# Patient Record
Sex: Female | Born: 1965 | Race: Black or African American | Hispanic: No | Marital: Married | Smoking: Never smoker
Health system: Southern US, Community
[De-identification: ages and names within clinical notes are randomized; demographics above are authoritative.]

## PROBLEM LIST (undated history)

## (undated) DIAGNOSIS — I2510 Atherosclerotic heart disease of native coronary artery without angina pectoris: Secondary | ICD-10-CM

## (undated) DIAGNOSIS — N186 End stage renal disease: Secondary | ICD-10-CM

## (undated) DIAGNOSIS — E669 Obesity, unspecified: Secondary | ICD-10-CM

## (undated) DIAGNOSIS — Z992 Dependence on renal dialysis: Secondary | ICD-10-CM

## (undated) DIAGNOSIS — I10 Essential (primary) hypertension: Secondary | ICD-10-CM

## (undated) DIAGNOSIS — I499 Cardiac arrhythmia, unspecified: Secondary | ICD-10-CM

## (undated) DIAGNOSIS — E785 Hyperlipidemia, unspecified: Secondary | ICD-10-CM

## (undated) HISTORY — DX: Essential (primary) hypertension: I10

## (undated) HISTORY — DX: Cardiac arrhythmia, unspecified: I49.9

## (undated) HISTORY — PX: TRACHEOSTOMY: SUR1362

## (undated) HISTORY — PX: GASTRIC BYPASS: SHX52

## (undated) HISTORY — DX: Hyperlipidemia, unspecified: E78.5

## (undated) HISTORY — DX: Atherosclerotic heart disease of native coronary artery without angina pectoris: I25.10

## (undated) HISTORY — DX: Obesity, unspecified: E66.9

---

## 1988-11-19 HISTORY — PX: CHOLECYSTECTOMY: SHX55

## 1998-05-10 ENCOUNTER — Inpatient Hospital Stay (HOSPITAL_COMMUNITY): Admission: AD | Admit: 1998-05-10 | Discharge: 1998-05-10 | Payer: Self-pay | Admitting: Obstetrics and Gynecology

## 1998-06-22 ENCOUNTER — Inpatient Hospital Stay (HOSPITAL_COMMUNITY): Admission: AD | Admit: 1998-06-22 | Discharge: 1998-06-26 | Payer: Self-pay | Admitting: Obstetrics and Gynecology

## 2000-07-06 ENCOUNTER — Ambulatory Visit (HOSPITAL_COMMUNITY): Admission: RE | Admit: 2000-07-06 | Discharge: 2000-07-06 | Payer: Self-pay | Admitting: Obstetrics & Gynecology

## 2000-07-06 ENCOUNTER — Encounter (INDEPENDENT_AMBULATORY_CARE_PROVIDER_SITE_OTHER): Payer: Self-pay

## 2001-02-07 ENCOUNTER — Ambulatory Visit (HOSPITAL_COMMUNITY): Admission: RE | Admit: 2001-02-07 | Discharge: 2001-02-07 | Payer: Self-pay | Admitting: Family Medicine

## 2001-02-07 ENCOUNTER — Encounter: Payer: Self-pay | Admitting: Family Medicine

## 2001-08-15 ENCOUNTER — Encounter: Admission: RE | Admit: 2001-08-15 | Discharge: 2001-11-13 | Payer: Self-pay | Admitting: Family Medicine

## 2004-04-12 ENCOUNTER — Encounter: Admission: RE | Admit: 2004-04-12 | Discharge: 2004-07-11 | Payer: Self-pay | Admitting: Endocrinology

## 2006-08-23 ENCOUNTER — Encounter: Admission: RE | Admit: 2006-08-23 | Discharge: 2006-08-23 | Payer: Self-pay | Admitting: Obstetrics and Gynecology

## 2009-02-01 ENCOUNTER — Encounter: Admission: RE | Admit: 2009-02-01 | Discharge: 2009-02-01 | Payer: Self-pay | Admitting: Family Medicine

## 2010-12-09 ENCOUNTER — Encounter: Payer: Self-pay | Admitting: Obstetrics and Gynecology

## 2010-12-20 HISTORY — PX: OTHER SURGICAL HISTORY: SHX169

## 2011-04-06 NOTE — Op Note (Signed)
[facility]  Patient:    Courtney [name], Courtney [name]               MRN: [id number] Proc. Date: 07/06/00 Adm. Date:  [id number] Attending:  Minette Headland Dictator:   Freddy Finner, M.D.                           Operative Report  PREOPERATIVE DIAGNOSES:       1. Intrauterine pregnancy at approximately                                  [redacted] weeks gestation with intrauterine fetal                                  demise.                               2. Insulin-dependent diabetes.                               3. Advanced maternal age.  POSTOPERATIVE DIAGNOSES:      1. Intrauterine pregnancy at approximately                                  [redacted] weeks gestation with intrauterine fetal                                  demise.                               2. Insulin-dependent diabetes.                               3. Advanced maternal age.  OPERATION:                    D&E.  SURGEON:                      Freddy Finner, M.D.  ANESTHESIA:                   Spinal.  ESTIMATED BLOOD LOSS:         Less than or equal to 200 cc.  INTRAOPERATIVE COMPLICATIONS:                None.  INDICATIONS:                  The patient is a 45 year old married female who had uncontrolled diabetes early in her pregnancy and was found to have a nonviable fetus on a level 3 ultrasound in [place] on approximately July 03, 2000.  She is admitted now for D&E.  She was seen in the office on the day prior to the procedure at which time two thick laminaria were placed into the cervix and a sponge to hold them in the cervix.  She was admitted on the morning of surgery.  DESCRIPTION OF PROCEDURE:  She was brought to the operating room and there placed under adequate spinal anesthesia and placed in the dorsal lithotomy position.  A Betadine prep was carried out in the usual fashion.  A bivalved speculum was introduced.  The cervix was grasped on its anterior lip  with ring forceps and the laminaria removed.  The cervix was progressively dilated to allow passage of a 15-mm suction cannula; this was introduced into the uterine cavity and aspiration produced obvious products of conception.  This was continued until the cervical cavity was evacuated.  A curettage with a banjo curet and exploration with placenta forceps was carried out.  Inspection of the products of conception confirmed the presence of all fetal parts including the skull.  The procedure at this point was terminated, the instruments removed.  The patient was taken to the recovery room in good condition.  She will receive RhoGAM because of her Rh negative status.  She is to call for fever, severe pain, heavy bleeding.  She is to avoid vaginal entry until her visit in the office in 1-1/2 to 2 weeks.  She is given Darvocet to be taken as needed for postoperative pain. DD:  07/07/00 TD:  07/08/00 Job: [id number]

## 2011-08-20 HISTORY — PX: COLONOSCOPY: SHX174

## 2011-09-21 ENCOUNTER — Other Ambulatory Visit: Payer: Self-pay | Admitting: Obstetrics and Gynecology

## 2011-09-21 ENCOUNTER — Ambulatory Visit: Admit: 2011-09-21 | Payer: Self-pay | Admitting: Obstetrics and Gynecology

## 2011-09-21 ENCOUNTER — Ambulatory Visit (HOSPITAL_BASED_OUTPATIENT_CLINIC_OR_DEPARTMENT_OTHER)
Admission: RE | Admit: 2011-09-21 | Discharge: 2011-09-21 | Disposition: A | Discharge status: Home / Self Care | Payer: 59 | Source: Ambulatory Visit | Attending: Obstetrics and Gynecology | Admitting: Obstetrics and Gynecology

## 2011-09-21 DIAGNOSIS — Z794 Long term (current) use of insulin: Secondary | ICD-10-CM | POA: Insufficient documentation

## 2011-09-21 DIAGNOSIS — N840 Polyp of corpus uteri: Secondary | ICD-10-CM | POA: Insufficient documentation

## 2011-09-21 DIAGNOSIS — N920 Excessive and frequent menstruation with regular cycle: Secondary | ICD-10-CM | POA: Insufficient documentation

## 2011-09-21 DIAGNOSIS — E119 Type 2 diabetes mellitus without complications: Secondary | ICD-10-CM | POA: Insufficient documentation

## 2011-09-21 DIAGNOSIS — I10 Essential (primary) hypertension: Secondary | ICD-10-CM | POA: Insufficient documentation

## 2011-09-21 DIAGNOSIS — Z79899 Other long term (current) drug therapy: Secondary | ICD-10-CM | POA: Insufficient documentation

## 2011-09-21 DIAGNOSIS — N879 Dysplasia of cervix uteri, unspecified: Secondary | ICD-10-CM | POA: Insufficient documentation

## 2011-09-21 DIAGNOSIS — D500 Iron deficiency anemia secondary to blood loss (chronic): Secondary | ICD-10-CM | POA: Insufficient documentation

## 2011-09-21 DIAGNOSIS — Z01812 Encounter for preprocedural laboratory examination: Secondary | ICD-10-CM | POA: Insufficient documentation

## 2011-09-21 DIAGNOSIS — E7800 Pure hypercholesterolemia, unspecified: Secondary | ICD-10-CM | POA: Insufficient documentation

## 2011-09-21 LAB — BASIC METABOLIC PANEL
BUN: 12 mg/dL (ref 6–23)
CO2: 23 mEq/L (ref 19–32)
Calcium: 9.2 mg/dL (ref 8.4–10.5)
Chloride: 102 mEq/L (ref 96–112)
Creatinine, Ser: 0.7 mg/dL (ref 0.50–1.10)
GFR calc Af Amer: >90 mL/min (ref >90)
GFR calc non Af Amer: >90 mL/min (ref >90)
Glucose, Bld: 201 mg/dL — ABNORMAL HIGH (ref 70–99)
Potassium: 3.9 mEq/L (ref 3.5–5.1)
Sodium: 134 mEq/L — ABNORMAL LOW (ref 135–145)

## 2011-09-21 LAB — CBC
HCT: 28.6 % — ABNORMAL LOW (ref 36.0–46.0)
Hemoglobin: 9 g/dL — ABNORMAL LOW (ref 12.0–15.0)
MCH: 28.2 pg (ref 26.0–34.0)
MCHC: 31.5 g/dL (ref 30.0–36.0)
MCV: 89.7 fL (ref 78.0–100.0)
Platelets: 296 10*3/uL (ref 150–400)
RBC: 3.19 MIL/uL — ABNORMAL LOW (ref 3.87–5.11)
RDW: 13.1 % (ref 11.5–15.5)
WBC: 6.2 10*3/uL (ref 4.0–10.5)

## 2011-09-21 LAB — HCG, SERUM, QUALITATIVE: Preg, Serum: NEGATIVE

## 2011-09-21 LAB — GLUCOSE, CAPILLARY: Glucose-Capillary: 189 mg/dL — ABNORMAL HIGH (ref 70–99)

## 2011-09-21 SURGERY — DILATATION & CURETTAGE/HYSTEROSCOPY WITH RESECTOCOPE
Anesthesia: Choice

## 2011-09-24 NOTE — Op Note (Signed)
NAMEBEREA, MAJKOWSKI       ACCOUNT NO.:  [id number]  MEDICAL RECORD NO.:  [id number]  LOCATION:                                 FACILITY:  PHYSICIAN:  Juluis Mire, M.D.   DATE OF BIRTH:  12-22-1965  DATE OF PROCEDURE:  09/21/2011 DATE OF DISCHARGE:                              OPERATIVE REPORT   PREOPERATIVE DIAGNOSES:  Endometrial polyp; cervical dysplasia, high- grade with unsatisfactory colposcopy.  POSTOPERATIVE DIAGNOSES:  Endometrial polyp; cervical dysplasia, high- grade with unsatisfactory colposcopy.  PROCEDURE:  Paracervical block using 1% Xylocaine with epinephrine. Cervical dilation, hysteroscopy with endometrial resection as well as curettings.  Subsequent loop electrical excision procedure of the cervix.  SURGEONS:  Juluis Mire, MD  ANESTHESIA:  General with paracervical block.  ESTIMATED BLOOD LOSS:  Minimal.  PACKS AND DRAINS:  None.  INTRAOPERATIVE BLOOD PLACED:  None.  COMPLICATIONS:  Were none.  INDICATIONS:  Dictated history and physical.  PROCEDURE IN DETAIL:  The patient was taken to the OR and placed in supine position.  After satisfactory level of general anesthesia obtained, the patient was placed in dorsal lithotomy position using the Allen stirrups.  Perineum and vagina prepped out with Betadine and draped in sterile field.  Speculum was placed in the vaginal vault.  The cervix was grasped with a single-tooth tenaculum.  Paracervical block of 1% Xylocaine with epinephrine was instituted.  Uterus sounded to 10 cm. Cervix serially dilated to a size 35 Pratt dilator.  Hysteroscope was introduced and intrauterine cavity was distended using glycine.  She had a thickened endometrium posteriorly, but no definitive polyp was noted for fibroid.  We went ahead and resected the thickening posteriorly and sent for pathological review.  Remaining uterine cavity is unremarkable. Curettings were obtained.  Deficit was minimal.  At this  point in time, the coated speculum was put in place.  Suction was applied.  We used a wide loop and shaved a cone from the cervix that was sent for pathological review.  Cone bed was cauterized using the Bovie and Monsel's was applied.  We had good hemostasis.  The patient was taken out of the dorsal lithotomy position. Once alert and extubated, transferred to recovery room in good condition.  Sponge, instrument, and needle count was correct by circulating nurse.     Juluis Mire, M.D.     JSM/MEDQ  D:  09/21/2011  T:  09/22/2011  Job:  [id number]

## 2011-10-04 NOTE — H&P (Signed)
Courtney [name], Courtney [name]      ACCOUNT NO.:  [id number]  MEDICAL RECORD NO.:  [id number]  LOCATION:                                 FACILITY:  PHYSICIAN:  Juluis Mire, M.D.   DATE OF BIRTH:  1966-07-10  DATE OF ADMISSION:  09/21/2011 DATE OF DISCHARGE:                             HISTORY & PHYSICAL   DATE OF SURGERY:  September 21, 2011.  HISTORY AND PHYSICAL:  45 year old, gravida 3, para 2, abortus 1 female presents for hysteroscopy, D and C, as well as loop electrical excision procedure of the cervix.  In relation to present admission, the patient is having trouble with menorrhagia with associated anemia.  Hemoglobin has been as low as 8.7.  Cycles are extremely heavy.  We did a saline infusion ultrasound in the office.  She has a large endometrial polyp that needs to be resected.  She also had abnormal cervical cytology suggestive of high-grade dysplasia.  Colposcopy was not satisfactory, therefore we are going to proceed with a loop electrical excision procedure at the time of the surgery.  In terms of medications, she is on metoprolol, Plavix, simvastatin, aspirin, metformin, Altace, gabapentin, Valtrex, Lantus, and fish oil.  PAST MEDICAL HISTORY:  Significant, she has a history of insulin- dependent diabetes.  She is being actively managed for that at the present time, also history of elevated cholesterol as noted.  Also does have hypertension.  PAST SURGICAL HISTORY:  She has had her gallbladder removed.  She has had 1 previous D and Cs, otherwise she has had 2 vaginal deliveries.  SOCIAL HISTORY:  Reveals no tobacco or alcohol use.  FAMILY HISTORY:  Significant.  There is a history of pancreatic and colon cancer, also a history of diabetes and heart disease.  REVIEW OF SYSTEMS:  Noncontributory.  PHYSICAL EXAMINATION:  VITAL SIGNS:  The patient is afebrile, stable vital signs. HEENT EXAM:  The patient is normocephalic.  Pupils equal, round, and reactive  to light and accommodation.  Extraocular movements are intact. Sclerae and conjunctivae are clear.  Oropharynx clear. NECK:  Without thyromegaly. BREASTS:  Not examined. LUNGS:  Clear. CARDIOVASCULAR SYSTEM:  Regular rhythm and rate without murmurs or gallops. ABDOMINAL EXAM:  Benign.  No mass, organomegaly, or tenderness. PELVIC:  Normal external genitalia.  Vaginal mucosa is clear.  Cervix unremarkable.  Uterus normal size, shape, and contour.  Adnexa free of mass or tenderness. EXTREMITIES:  Trace edema. NEUROLOGIC EXAM:  Grossly within normal limits.  IMPRESSION: 1. Menorrhagia with associated anemia and endometrial polyp. 2. Abnormal cervical cytology suggestive of high-grade dysplasia with     unsatisfactory colposcopy. 3. Insulin-dependent diabetes. 4. Hypertension. 5. Hypercholesterolemia.  PLAN:  The patient undergo hysteroscopy with resectoscope and D and C. The polyp will be resected.  Risks were explained including the risk of infection.  The risk of hemorrhage that could require transfusion withthe risk of AIDS or hepatitis.  Excessive bleeding could require hysterectomy.  There is a risk of perforation leading to injury to adjacent organs.  This can require further surgical management.  There is risk of deep venous thrombosis and pulmonary embolus.  With a loop of electrical excision procedure, discussed a 4% chance of continued bleeding require retreatment with the  risk of infection.  Risk of cervical stenosis that could lead to inability to conceive.  Risk of incompetent cervix could lead to inability to maintain pregnancy.  The patient understands the potential risks, complications, and alternatives.     Juluis Mire, M.D.     JSM/MEDQ  D:  09/19/2011  T:  09/19/2011  Job:  [id number]  Electronically Signed by Richardean Chimera M.D. on 10/04/2011 05:26:53 AM

## 2012-01-25 ENCOUNTER — Other Ambulatory Visit: Payer: Self-pay | Admitting: Cardiology

## 2012-01-25 ENCOUNTER — Inpatient Hospital Stay (HOSPITAL_COMMUNITY)
Admission: AD | Admit: 2012-01-25 | Discharge: 2012-01-31 | DRG: 287 | Disposition: A | Discharge status: Home / Self Care | Payer: 59 | Source: Ambulatory Visit | Attending: Interventional Cardiology | Admitting: Interventional Cardiology

## 2012-01-25 ENCOUNTER — Encounter: Payer: Self-pay | Admitting: Cardiology

## 2012-01-25 DIAGNOSIS — Z91041 Radiographic dye allergy status: Secondary | ICD-10-CM

## 2012-01-25 DIAGNOSIS — I2510 Atherosclerotic heart disease of native coronary artery without angina pectoris: Secondary | ICD-10-CM

## 2012-01-25 DIAGNOSIS — E1149 Type 2 diabetes mellitus with other diabetic neurological complication: Secondary | ICD-10-CM | POA: Diagnosis present

## 2012-01-25 DIAGNOSIS — N920 Excessive and frequent menstruation with regular cycle: Secondary | ICD-10-CM | POA: Diagnosis present

## 2012-01-25 DIAGNOSIS — G4733 Obstructive sleep apnea (adult) (pediatric): Secondary | ICD-10-CM | POA: Diagnosis present

## 2012-01-25 DIAGNOSIS — I472 Ventricular tachycardia, unspecified: Principal | ICD-10-CM

## 2012-01-25 DIAGNOSIS — N289 Disorder of kidney and ureter, unspecified: Secondary | ICD-10-CM

## 2012-01-25 DIAGNOSIS — I4729 Other ventricular tachycardia: Principal | ICD-10-CM | POA: Diagnosis present

## 2012-01-25 DIAGNOSIS — R5381 Other malaise: Secondary | ICD-10-CM | POA: Diagnosis present

## 2012-01-25 DIAGNOSIS — I200 Unstable angina: Secondary | ICD-10-CM | POA: Diagnosis present

## 2012-01-25 DIAGNOSIS — D509 Iron deficiency anemia, unspecified: Secondary | ICD-10-CM | POA: Diagnosis present

## 2012-01-25 DIAGNOSIS — E875 Hyperkalemia: Secondary | ICD-10-CM | POA: Diagnosis present

## 2012-01-25 DIAGNOSIS — Z6841 Body Mass Index (BMI) 40.0 and over, adult: Secondary | ICD-10-CM

## 2012-01-25 DIAGNOSIS — E11319 Type 2 diabetes mellitus with unspecified diabetic retinopathy without macular edema: Secondary | ICD-10-CM | POA: Diagnosis present

## 2012-01-25 DIAGNOSIS — I129 Hypertensive chronic kidney disease with stage 1 through stage 4 chronic kidney disease, or unspecified chronic kidney disease: Secondary | ICD-10-CM | POA: Diagnosis present

## 2012-01-25 DIAGNOSIS — N189 Chronic kidney disease, unspecified: Secondary | ICD-10-CM | POA: Diagnosis present

## 2012-01-25 DIAGNOSIS — E1139 Type 2 diabetes mellitus with other diabetic ophthalmic complication: Secondary | ICD-10-CM | POA: Diagnosis present

## 2012-01-25 DIAGNOSIS — I5032 Chronic diastolic (congestive) heart failure: Secondary | ICD-10-CM

## 2012-01-25 DIAGNOSIS — E1142 Type 2 diabetes mellitus with diabetic polyneuropathy: Secondary | ICD-10-CM | POA: Diagnosis present

## 2012-01-25 LAB — COMPREHENSIVE METABOLIC PANEL
ALT: 9 U/L (ref 0–35)
BUN: 36 mg/dL — ABNORMAL HIGH (ref 6–23)
CO2: 24 mEq/L (ref 19–32)
Calcium: 9.7 mg/dL (ref 8.4–10.5)
Creatinine, Ser: 1.77 mg/dL — ABNORMAL HIGH (ref 0.50–1.10)
GFR calc Af Amer: 39 mL/min — ABNORMAL LOW (ref >90)
GFR calc non Af Amer: 33 mL/min — ABNORMAL LOW (ref >90)
Glucose, Bld: 128 mg/dL — ABNORMAL HIGH (ref 70–99)
Sodium: 137 mEq/L (ref 135–145)

## 2012-01-25 LAB — PROTIME-INR
INR: 1.17 (ref 0.00–1.49)
INR: 1.15 (ref 0.00–1.49)

## 2012-01-25 LAB — CBC
HCT: 29.6 % — ABNORMAL LOW (ref 36.0–46.0)
HCT: 29.3 % — ABNORMAL LOW (ref 36.0–46.0)
Hemoglobin: 9.8 g/dL — ABNORMAL LOW (ref 12.0–15.0)
Hemoglobin: 9.6 g/dL — ABNORMAL LOW (ref 12.0–15.0)
MCH: 30.2 pg (ref 26.0–34.0)
MCHC: 33.1 g/dL (ref 30.0–36.0)
MCHC: 32.8 g/dL (ref 30.0–36.0)
MCV: 91.4 fL (ref 78.0–100.0)
MCV: 91.3 fL (ref 78.0–100.0)
RBC: 3.24 MIL/uL — ABNORMAL LOW (ref 3.87–5.11)

## 2012-01-25 LAB — BASIC METABOLIC PANEL
BUN: 36 mg/dL — ABNORMAL HIGH (ref 6–23)
CO2: 22 mEq/L (ref 19–32)
Calcium: 9.5 mg/dL (ref 8.4–10.5)
Creatinine, Ser: 1.59 mg/dL — ABNORMAL HIGH (ref 0.50–1.10)
Glucose, Bld: 127 mg/dL — ABNORMAL HIGH (ref 70–99)

## 2012-01-25 LAB — GLUCOSE, CAPILLARY
Glucose-Capillary: 139 mg/dL — ABNORMAL HIGH (ref 70–99)
Glucose-Capillary: 178 mg/dL — ABNORMAL HIGH (ref 70–99)

## 2012-01-25 LAB — DIFFERENTIAL
Basophils Absolute: 0.1 10*3/uL (ref 0.0–0.1)
Eosinophils Relative: 1 % (ref 0–5)
Lymphocytes Relative: 37 % (ref 12–46)
Neutrophils Relative %: 54 % (ref 43–77)

## 2012-01-25 LAB — HEMOGLOBIN A1C
Hgb A1c MFr Bld: 9.5 % — ABNORMAL HIGH (ref <5.7)
Mean Plasma Glucose: 226 mg/dL — ABNORMAL HIGH (ref <117)

## 2012-01-25 LAB — CARDIAC PANEL(CRET KIN+CKTOT+MB+TROPI)
CK, MB: 2.1 ng/mL (ref 0.3–4.0)
Relative Index: INVALID (ref 0.0–2.5)
Relative Index: 1.7 (ref 0.0–2.5)
Total CK: 122 U/L (ref 7–177)
Troponin I: <0.3 ng/mL (ref <0.30)
Troponin I: <0.3 ng/mL (ref <0.30)

## 2012-01-25 MED ORDER — ASPIRIN 325 MG PO TABS
325.0000 mg | ORAL_TABLET | Freq: Every day | ORAL | Status: DC
  Administered 2012-01-26 – 2012-01-27 (×2): 325 mg via ORAL
  Filled 2012-01-25 (×6): qty 1

## 2012-01-25 MED ORDER — ONDANSETRON HCL 4 MG/2ML IJ SOLN: 4.0000 mg | Freq: Four times a day (QID) | INTRAMUSCULAR | Status: DC | PRN

## 2012-01-25 MED ORDER — ASPIRIN EC 81 MG PO TBEC: 81.0000 mg | DELAYED_RELEASE_TABLET | Freq: Every day | ORAL | Status: DC

## 2012-01-25 MED ORDER — DICLOFENAC SODIUM 75 MG PO TBEC
75.0000 mg | DELAYED_RELEASE_TABLET | Freq: Two times a day (BID) | ORAL | Status: DC
  Administered 2012-01-25 – 2012-01-27 (×4): 75 mg via ORAL
  Filled 2012-01-25 (×5): qty 1

## 2012-01-25 MED ORDER — FERROUS SULFATE 325 (65 FE) MG PO TABS
325.0000 mg | ORAL_TABLET | Freq: Two times a day (BID) | ORAL | Status: DC
  Administered 2012-01-25 – 2012-01-31 (×13): 325 mg via ORAL
  Filled 2012-01-25 (×15): qty 1

## 2012-01-25 MED ORDER — ACETAMINOPHEN 325 MG PO TABS
650.0000 mg | ORAL_TABLET | ORAL | Status: DC | PRN
  Administered 2012-01-29: 650 mg via ORAL
  Filled 2012-01-25: qty 2

## 2012-01-25 MED ORDER — SODIUM CHLORIDE 0.9 % IV SOLN: 250.0000 mL | INTRAVENOUS | Status: DC | PRN

## 2012-01-25 MED ORDER — SODIUM CHLORIDE 0.9 % IV SOLN: INTRAVENOUS | Status: DC

## 2012-01-25 MED ORDER — NITROGLYCERIN 0.4 MG SL SUBL: 0.4000 mg | SUBLINGUAL_TABLET | SUBLINGUAL | Status: DC | PRN

## 2012-01-25 MED ORDER — ASPIRIN 81 MG PO CHEW
324.0000 mg | CHEWABLE_TABLET | ORAL | Status: AC
  Administered 2012-01-28: 324 mg via ORAL
  Filled 2012-01-25: qty 4

## 2012-01-25 MED ORDER — POLYETHYLENE GLYCOL 3350 17 G PO PACK
17.0000 g | PACK | Freq: Every day | ORAL | Status: DC
  Administered 2012-01-25 – 2012-01-31 (×6): 17 g via ORAL
  Filled 2012-01-25 (×7): qty 1

## 2012-01-25 MED ORDER — DIAZEPAM 5 MG PO TABS
5.0000 mg | ORAL_TABLET | ORAL | Status: AC
  Administered 2012-01-28: 5 mg via ORAL
  Filled 2012-01-25: qty 1

## 2012-01-25 MED ORDER — ASPIRIN 81 MG PO CHEW
324.0000 mg | CHEWABLE_TABLET | ORAL | Status: AC
  Administered 2012-01-25: 324 mg via ORAL
  Filled 2012-01-25: qty 4

## 2012-01-25 MED ORDER — INSULIN GLARGINE 100 UNIT/ML ~~LOC~~ SOLN
50.0000 [IU] | Freq: Every day | SUBCUTANEOUS | Status: DC
  Administered 2012-01-25 – 2012-01-30 (×6): 50 [IU] via SUBCUTANEOUS
  Filled 2012-01-25: qty 3

## 2012-01-25 MED ORDER — ASPIRIN 300 MG RE SUPP
300.0000 mg | RECTAL | Status: AC
  Filled 2012-01-25: qty 1

## 2012-01-25 MED ORDER — OMEGA-3 FATTY ACIDS 1000 MG PO CAPS: 2.0000 g | ORAL_CAPSULE | Freq: Every day | ORAL | Status: DC

## 2012-01-25 MED ORDER — AMLODIPINE BESYLATE 5 MG PO TABS
5.0000 mg | ORAL_TABLET | Freq: Every day | ORAL | Status: DC
  Administered 2012-01-25 – 2012-01-27 (×3): 5 mg via ORAL
  Filled 2012-01-25 (×5): qty 1

## 2012-01-25 MED ORDER — SIMVASTATIN 40 MG PO TABS
40.0000 mg | ORAL_TABLET | Freq: Every evening | ORAL | Status: DC
  Administered 2012-01-25: 40 mg via ORAL
  Filled 2012-01-25 (×2): qty 1

## 2012-01-25 MED ORDER — SODIUM CHLORIDE 0.9 % IJ SOLN: 3.0000 mL | INTRAMUSCULAR | Status: DC | PRN

## 2012-01-25 MED ORDER — SODIUM CHLORIDE 0.9 % IJ SOLN
3.0000 mL | INTRAMUSCULAR | Status: DC | PRN
  Administered 2012-01-26 (×2): 3 mL via INTRAVENOUS

## 2012-01-25 MED ORDER — GABAPENTIN 300 MG PO CAPS
300.0000 mg | ORAL_CAPSULE | Freq: Two times a day (BID) | ORAL | Status: DC
  Administered 2012-01-25 – 2012-01-31 (×11): 300 mg via ORAL
  Filled 2012-01-25 (×13): qty 1

## 2012-01-25 MED ORDER — IRBESARTAN 150 MG PO TABS
150.0000 mg | ORAL_TABLET | Freq: Every day | ORAL | Status: DC
  Administered 2012-01-25 – 2012-01-26 (×2): 150 mg via ORAL
  Filled 2012-01-25 (×2): qty 1

## 2012-01-25 MED ORDER — FAMOTIDINE IN NACL 20-0.9 MG/50ML-% IV SOLN
20.0000 mg | INTRAVENOUS | Status: AC
  Administered 2012-01-28: 20 mg via INTRAVENOUS
  Filled 2012-01-25: qty 50

## 2012-01-25 MED ORDER — PREDNISONE 50 MG PO TABS
60.0000 mg | ORAL_TABLET | ORAL | Status: AC
  Administered 2012-01-28: 60 mg via ORAL
  Filled 2012-01-25: qty 1

## 2012-01-25 MED ORDER — AMLODIPINE-OLMESARTAN 5-20 MG PO TABS: 1.0000 | ORAL_TABLET | Freq: Every day | ORAL | Status: DC

## 2012-01-25 MED ORDER — METOPROLOL TARTRATE 50 MG PO TABS
50.0000 mg | ORAL_TABLET | Freq: Two times a day (BID) | ORAL | Status: DC
  Administered 2012-01-25 – 2012-01-28 (×6): 50 mg via ORAL
  Filled 2012-01-25 (×9): qty 1

## 2012-01-25 MED ORDER — PREDNISONE 50 MG PO TABS
60.0000 mg | ORAL_TABLET | ORAL | Status: AC
  Administered 2012-01-27: 60 mg via ORAL
  Filled 2012-01-25: qty 1

## 2012-01-25 MED ORDER — HEPARIN SODIUM (PORCINE) 5000 UNIT/ML IJ SOLN
5000.0000 [IU] | Freq: Three times a day (TID) | INTRAMUSCULAR | Status: DC
  Administered 2012-01-25 – 2012-01-28 (×8): 5000 [IU] via SUBCUTANEOUS
  Filled 2012-01-25 (×11): qty 1

## 2012-01-25 MED ORDER — CLOPIDOGREL BISULFATE 75 MG PO TABS
75.0000 mg | ORAL_TABLET | Freq: Every day | ORAL | Status: DC
  Administered 2012-01-25 – 2012-01-27 (×3): 75 mg via ORAL
  Filled 2012-01-25 (×4): qty 1

## 2012-01-25 MED ORDER — OMEGA-3-ACID ETHYL ESTERS 1 G PO CAPS
1.0000 g | ORAL_CAPSULE | Freq: Every day | ORAL | Status: DC
  Administered 2012-01-25 – 2012-01-31 (×6): 1 g via ORAL
  Filled 2012-01-25 (×7): qty 1

## 2012-01-25 MED ORDER — DIPHENHYDRAMINE HCL 50 MG/ML IJ SOLN
25.0000 mg | INTRAMUSCULAR | Status: AC
  Administered 2012-01-28: 25 mg via INTRAVENOUS
  Filled 2012-01-25: qty 1
  Filled 2012-01-25: qty 0.5

## 2012-01-25 MED ORDER — SODIUM CHLORIDE 0.9 % IJ SOLN: 3.0000 mL | Freq: Two times a day (BID) | INTRAMUSCULAR | Status: DC

## 2012-01-25 MED ORDER — SODIUM CHLORIDE 0.9 % IJ SOLN
3.0000 mL | Freq: Two times a day (BID) | INTRAMUSCULAR | Status: DC
  Administered 2012-01-25: 3 mL via INTRAVENOUS

## 2012-01-25 NOTE — Plan of Care (Signed)
Problem: Phase I Progression Outcomes Goal: Voiding-avoid urinary catheter unless indicated Outcome: Completed/Met Date Met:  01/25/12 voiding

## 2012-01-25 NOTE — H&P (Addendum)
Office Visit     Patient: Courtney [name], [place] Provider: Michaell Cowing. Emelda Fear, NP  DOB: 1965/11/28 Age: 46 Y Sex: Female Date: 01/18/2012  Phone: [phone]   Address: [address], [place], [phone]       Subjective:     CC:    1. CF/heart racing.        HPI:  General:  Courtney [name] is a 46 yo female with a hx of Diabetes, HTN, obesity,CAD with BMS 2010, and hx nonsustained V tac prior to LAD stent 2010 and metoprolol. Holter monitor 6/12 with occasional PVC no runs and in NSR. 10/12 nuclear stres test without ischemia. She comes in today after feeling her heart rate was up to 135 at 430 this am at home, awakened from sleep and she was lightheaded. She will suddenly feel dizziness like she might pass out if she does not sit down. This occurred twice the last few nights where she felt very dizzy. This occurred a few years ago when she was anemic and had transfusion. She is followed by GYN, Dr Arelia Sneddon due to vaginal bleeding but this has improved. She reports last Hgb check was low. She states she has not been taking her Fe tablets twice a day. She may have shortness of breath even at rest. A sleep study has been recommended but she has not obtained..  Home BP 160/100.       ROS:  as noted in HPI, she has multiple complaints, such as nausea at times unrelated to meals, and generally feels fatigued. no syncope, no fever, chills nor congestion.       Medical History: Type 2 Diabetes Mellitus, retinopathy, neuropathy, and microalbuminemia., Obesity, Hypercholesterolemia, Coronary artery disease, status post BM Stent, February 2010, Hypertension, Tachycardia, Symptomatic nonsustained V. tach, resolved with LAD stent and beta blocker. Norma lLVEF by cath, echo, and nuclear 12/2008.        Surgical History: Cholecystectomy 1990, Cardiac Stent February 2010, Colonoscopy 09/19/2011.        Hospitalization/Major Diagnostic Procedure: Pneumonia 2010, cholecystitis 1990, not in  the past year 08/2011.        Family History: Father: alive Diabetes Mellitus,Hypertension, Bypass Surgery Mother: deceased Pancreatic cancer colon cancer Paternal Grand Father: deceased Diabetes Mellitus, Hypertension, Alzheimer's Paternal Grand Mother: deceased Diabetes Mellitus, Hypertension Maternal Grand Father: deceased Unknown Maternal Grand Mother: alive Colon Cancer Sister 1: alive colon cancer Sister 2: alive Well Sister 3: alive Well  Sisters 4, 5, 6, and 7 - Alive and Well.       Social History:  General: History of smoking cigarettes: Never smoked. no Smoking. Alcohol: yes, Social. no Caffeine. no Recreational drug use. Exercise: yes. Occupation: unemployed. Marital Status: married. Children: 2.        Medications: Ferrous Sulfate 325 (65 Fe) MG Tablet 1 tablet Twice a day, Simvastatin 40 MG Tablet 1 tablet every evening Once a day, Fish Oil 1000 MG Capsule 1 capsule with a meal Once a day, Miralax suspension 17 grams at hour of sleep, Metformin HCl 1000 MG Tablet 1 tablet Twice a day, Gabapentin 300 MG Capsule 1 am/1 pm Daily, Voltaren 75 MG tablet Tablet 1 tablet BID, Lantus 100 UNIT/ML Solution 40 units qhs, Cinnamon 500 MG Tablet 1 tablet occ, Aspirin 325 MG Tablet Delayed Release 1 tablet every day, Plavix 75 MG Tablet 1 tablet once a day, Azor 5-20 MG Tablet 1 tablet Once a day, Metoprolol Tartrate 50 MG Tablet 1 tablet Twice a day, Medication List reviewed and  reconciled with the patient       Allergies: Ampicillin: rash; can tolerate amoxicillin, contrast dye: hives.       Objective:     Vitals: Wt 300.8, Wt change -2.9 lb, Ht 69.5, BMI 43.78, Pulse sitting 76, BP sitting 100/60.       Examination:  Cardiology Exam:  GENERAL APPEARANCE: pleasant, NAD, obese, female. HEENT: normal. CAROTID UPSTROKE: no bruit, upstrokes intact. JVD: flat. HEART: regular rate and rhythm, normal S1S2, no rub, no gallop, or click occasional individual skipped beats, reassessed 30 minutes  after EKG, skipping decreased.. LUNGS: clear to auscultation, no wheezing/rhonchi/rales. ABDOMEN: soft, non-tender, + bowel sounds. EXTREMITIES: no leg edema. PERIPHERAL PULSES: 2+, bilateral.        Assessment:     Assessment:  1. Lightheadedness - 780.4 (Primary)  2. Coronary artery disease - 414.00, reassuring nuclear stress test 10/12  3. PVC - 427.69  4. Near syncope - 780.2    Plan:     1. Lightheadedness  LAB: Basic Metabolic    GLUCOSE 411 70-99 - mg/dL HH   BUN 23 1-61 - mg/dL    CREATININE 0.96 0.45-4.09 - mg/dl    eGFR (NON-AFRICAN AMERICAN) 48 >60 - calc L   eGFR (AFRICAN AMERICAN) 58 >60 - calc L   SODIUM 132 136-145 - mmol/L L   POTASSIUM 5.1 3.5-5.5 - mmol/L    CHLORIDE 100 98-107 - mmol/L    C02 25 22-32 - mg/dL    ANION GAP 81.1 9.1-47.8 - mmol/L    CALCIUM 9.7 8.6-10.3 - mg/dL     FERGUSON,CYNTHIA A 01/18/2012 03:44:45 PM > hyperglycemia, please ask pt has she been taking her insulin and metformin...please also send this to her PCP...this may explain alot of her symptoms we are awaiting cardiac monitor results [facility] 01/18/2012 04:55:32 PM > lmom informing pt and asked that she call back regarding ? of DM meds----to Dr Wynelle Link [facility] 01/18/2012 05:03:58 PM > Noted. She is supposed to be working with Dr. Sharl Ma, endocrinologist, for her diabetes. Will await her phone call to this office, but would forward info to Dr Sharl Ma as well. FERGUSON,CYNTHIA A 01/20/2012 01:13:26 PM > to Dr Sharl Ma [facility] 01/20/2012 05:39:21 PM > It appears that her most recent endocrinology visit occurred in May 2012. Courtney [name], please encourge Courtney [name] to schedule an office visit. Thank you. Courtney [name],Courtney [name] 01/22/2012 09:49:21 AM > Pt transferred to [facility] in [place] in December.    LAB: CBC with Diff    WBC 6.6 4.0-11.0 - K/ul    RBC 3.80 4.20-5.40 - M/uL L   HGB 11.7 12.0-16.0 - g/dL L   HCT 29.5 62.1-30.8 - % L   MCH 30.6 27.0-33.0 - pg    MPV 8.4  7.5-10.7 - fL    MCV 92.8 81.0-99.0 - fL    MCHC 33.0 32.0-36.0 - g/dL    RDW 65.7 84.6-96.2 - %    PLT 258 150-400 - K/uL    NEUT % 50.5 43.3-71.9 - %    LYMPH% 41.0 16.8-43.5 - %    MONO % 7.8 4.6-12.4 - %    EOS % 0.7 0.0-7.8 - %    BASO % 0.0 0.0-1.0 - %    NEUT # 3.4 1.9-7.2 - K/uL    LYMPH# 2.70 1.10-2.70 - K/uL    MONO # 0.5 0.3-0.8 - K/uL    EOS # 0.0 0.0-0.6 - K/uL    BASO # 0.0 0.0-0.1 - K/uL     FERGUSON,CYNTHIA  A 01/18/2012 03:19:29 PM > please tell pt her level of anemia is stable compared to her reported Hgb of 9.. cc to Dr Kingsley Plan 01/18/2012 04:53:14 PM > lmom informing pt Boehler,Eileen 01/21/2012 09:40:33 AM > copy of labs faxed to Dr Erlinda Hong Comb    Diagnostic Imaging:EKG NSR with Bigeminal PVCs vs possible PAC with aberrancies, Harward,Amy 01/18/2012 01:21:12 PM > FERGUSON,CYNTHIA A 01/18/2012 04:18:38 PM > also see scanned rhythmn strip..reviewed with Dr Anne Fu  I have asked pt to f/u with Dr Wynelle Link that some of her symptoms today such as nausea and sweatiness do not sound cardiac, I encouraged her to also f/u and have her sleep study that was offered to her from her weight loss examination and MD. she agrees to follow up with this.       2. Coronary artery disease Continue Simvastatin Tablet, 40 MG, 1 tablet every evening, Orally, Once a day ; Continue Fish Oil Capsule, 1000 MG, 1 capsule with a meal, Orally, Once a day ; Continue Aspirin Tablet Delayed Release, 325 MG, 1 tablet, Orally, every day ; Continue Plavix Tablet, 75 MG, 1 tablet, Orally, once a day ; Continue Azor Tablet, 5-20 MG, 1 tablet, Orally, Once a day .       3. PVC Continue Metoprolol Tartrate Tablet, 50 MG, 1 tablet, Orally, Twice a day .  LAB: TSH    TSH 1.04 0.34-5.60 - ulU/mL     FERGUSON,CYNTHIA A 01/18/2012 03:43:47 PM > stable Boehler,Eileen 01/18/2012 04:55:01 PM > lmom informing pt    with anemia stable we will obtain a cardiac monitor after EKG and rhythmn strip indicate bigeminal  PVCs to R/O dangerous arrhythmis and have pt also f/u with Dr Katrinka Blazing.       4. Near syncope  Diagnostic Imaging:EC Holter 48hr (Ordered for 01/18/2012)       5. Others Continue Metformin HCl Tablet, 1000 MG, 1 tablet, Orally, Twice a day ; Continue Lantus Solution, 100 UNIT/ML, 40 units, Subcutaneous, qhs ; Continue Ferrous Sulfate Tablet, 325 (65 Fe) MG, 1 tablet, Orally, Twice a day .        Immunizations:        Labs:        Procedure Codes: 16109 EKG I AND R, 80048 ECL BMP, 85025 ECL CBC PLATELET DIFF, 84443 ECL TSH, 60454 BLOOD COLLECTION ROUTINE VENIPUNCTURE       Preventive:         Follow Up: HS 4 weeks (Reason: PVCs, lightheadness, )      Provider: Michaell Cowing. Emelda Fear, NP  Patient: Courtney [name], [place] M DOB: [birth year] Date: 01/18/2012   The patient was found on Holter Monitor to have nonsustained ventricular tachycardia up to 46 beats. She was brought into the office today to review the findings of the Holter.  She had severe palpitations on Saturday 3/2 with presyncope which prompted an ER visit but she left.  This corresponded to the time on the Holter when she had the 46beat run of [facility].  Currently she is asymptomatic.   She is now admitted for further workup.  She says she has been having intermittent palpitations and dizziness for several months along with the chest pressure but never gets chest pressure without the palpitations.  VS on exam today showed BP 124/6mmHg and pulse 84bpm.  Exam benign.    IMPRESSION:  1.  Ventricular tachycardia up to 46 beats.  She has a history of VT in the presence of LAD stenosis in 2010 which resolved  after stent placement and beta blocker therapy.  Given reoccurence of VT and known history of CAD/DM, ischemic origin needs to be ruled out.  She has been having some chest pressure in association with the palpitations. 2.  CAD s/p BMS to LAD 2010 3.  DM with retinopathy, neuropathy and microalbuminemia 4.  HTN 5.   dyslipidmiea 6.  Obesity  PLAN:  1.  Admit to step down unit 2.  Cycle cardiac enzymes 3.  Check TSH 4.  Cardiac cath on Monday 3/11 by Dr. Katrinka Blazing 5.  Increase metoprolol to 75mg  BID

## 2012-01-25 NOTE — Plan of Care (Signed)
Problem: Phase I Progression Outcomes Goal: Pain controlled with appropriate interventions Outcome: Completed/Met Date Met:  01/25/12 Patient has no c/o chest pain.

## 2012-01-26 LAB — BASIC METABOLIC PANEL
BUN: 40 mg/dL — ABNORMAL HIGH (ref 6–23)
CO2: 23 mEq/L (ref 19–32)
Chloride: 102 mEq/L (ref 96–112)
GFR calc Af Amer: 58 mL/min — ABNORMAL LOW (ref >90)
Potassium: 4.9 mEq/L (ref 3.5–5.1)

## 2012-01-26 LAB — CBC
HCT: 28.3 % — ABNORMAL LOW (ref 36.0–46.0)
Hemoglobin: 9.3 g/dL — ABNORMAL LOW (ref 12.0–15.0)
MCH: 30 pg (ref 26.0–34.0)
RBC: 3.1 MIL/uL — ABNORMAL LOW (ref 3.87–5.11)

## 2012-01-26 LAB — GLUCOSE, CAPILLARY: Glucose-Capillary: 194 mg/dL — ABNORMAL HIGH (ref 70–99)

## 2012-01-26 LAB — PRO B NATRIURETIC PEPTIDE: Pro B Natriuretic peptide (BNP): 101.6 pg/mL (ref 0–125)

## 2012-01-26 MED ORDER — ATORVASTATIN CALCIUM 20 MG PO TABS
20.0000 mg | ORAL_TABLET | Freq: Every day | ORAL | Status: DC
Start: 2012-01-26 — End: 2012-01-31
  Administered 2012-01-26 – 2012-01-31 (×6): 20 mg via ORAL
  Filled 2012-01-26 (×6): qty 1

## 2012-01-26 MED ORDER — SODIUM CHLORIDE 0.9 % IV SOLN
INTRAVENOUS | Status: DC
  Administered 2012-01-27: 23:00:00 via INTRAVENOUS

## 2012-01-26 MED ORDER — INSULIN ASPART 100 UNIT/ML ~~LOC~~ SOLN
0.0000 [IU] | Freq: Three times a day (TID) | SUBCUTANEOUS | Status: DC
  Administered 2012-01-26: 3 [IU] via SUBCUTANEOUS
  Administered 2012-01-26: 5 [IU] via SUBCUTANEOUS
  Administered 2012-01-27 (×2): 3 [IU] via SUBCUTANEOUS
  Administered 2012-01-28: 11 [IU] via SUBCUTANEOUS
  Administered 2012-01-28: 8 [IU] via SUBCUTANEOUS
  Administered 2012-01-29: 2 [IU] via SUBCUTANEOUS
  Administered 2012-01-29 – 2012-01-30 (×3): 3 [IU] via SUBCUTANEOUS
  Administered 2012-01-30: 8 [IU] via SUBCUTANEOUS
  Administered 2012-01-30: 15 [IU] via SUBCUTANEOUS
  Administered 2012-01-31: 11 [IU] via SUBCUTANEOUS
  Administered 2012-01-31: 8 [IU] via SUBCUTANEOUS
  Administered 2012-01-31: 3 [IU] via SUBCUTANEOUS
  Filled 2012-01-26: qty 3
  Filled 2012-01-26: qty 0.15

## 2012-01-26 NOTE — Progress Notes (Signed)
Patient transferring to 2000, patient alert, orient without any c/o pain or discomfort. Report given to RN discussed, reason for admission, history, planned cath on Monday and allergies. All questions answered.

## 2012-01-26 NOTE — Progress Notes (Signed)
MEDICATION RELATED CONSULT NOTE - INITIAL   MD: patient on amlodipine and simvastatin >20mg  PTA and in hospital. Patient's on such combinations have increased reports of rhabdomyolysis.   Per pharmacy protocol, will change simvastatin to atorvastatin to minimize the risk of rhabdo. Please address this upon discharge medication reconciliation.   Thank you,  Brett Fairy, PharmD Pager: [phone]  01/26/2012 9:13 AM

## 2012-01-26 NOTE — Progress Notes (Signed)
Patient Name: Mykaela Arena Date of Encounter: 01/26/2012    SUBJECTIVE: I am vaguely familiar with this patient. Over the past 6-8 months she has been seen more by our extender in the office then me. I do not recall her history which was playing out in [place]. She had near syncope while driving her car. She was found to be in V. tach at that time (2010). She had elevated cardiac markers, underwent cath, and had bare-metal stenting of the LAD. As I review the records it appears that very his symptoms began to recur this past fall (2012). A nuclear perfusion study was performed and was negative for ischemia. She has various complaints but more recently has had near syncope and palpitations. A Holter monitor was placed and recently and upon evaluation demonstrated greater than 10 seconds of sustained VT with associated symptoms. She denies chest pain and has not had any runs of tachycardia since admission.  There are other various complaints including left-sided weakness. She has anemia related to heavy menstrual bleeding. She says her diabetes has been under control but the A1c is 9.5.  TELEMETRY:  Nonsustained ventricular tachycardia. Filed Vitals:   01/26/12 0400 01/26/12 0500 01/26/12 0600 01/26/12 0800  BP: 107/47 123/60    Pulse: 61 62    Temp:   98.2 F (36.8 C) 99 F (37.2 C)  TempSrc:   Oral Oral  Resp: 21 19    Height:      Weight:   138 kg (304 lb 3.8 oz)   SpO2: 95% 97%      Intake/Output Summary (Last 24 hours) at 01/26/12 0857 Last data filed at 01/26/12 0600  Gross per 24 hour  Intake    120 ml  Output    400 ml  Net   -280 ml    LABS: Basic Metabolic Panel:  Basename 01/25/12 1943 01/25/12 1713  NA 136 137  K 4.6 5.1  CL 101 102  CO2 22 24  GLUCOSE 127* 128*  BUN 36* 36*  CREATININE 1.59* 1.77*  CALCIUM 9.5 9.7  MG -- --  PHOS -- --   CBC:  Basename 01/25/12 1943 01/25/12 1713  WBC 10.2 9.5  NEUTROABS -- 5.1  HGB 9.6* 9.8*  HCT 29.3* 29.6*    MCV 91.3 91.4  PLT 296 280   Cardiac Enzymes:  Basename 01/26/12 0510 01/25/12 2212 01/25/12 1712  CKTOTAL 109 122 91  CKMB 1.7 2.1 2.2  CKMBINDEX -- -- --  TROPONINI <0.30 <0.30 <0.30   Hemoglobin A1C:  Basename 01/25/12 1713  HGBA1C 9.5*    Radiology/Studies:  No studies ordered  Physical Exam: Blood pressure 123/60, pulse 62, temperature 99 F (37.2 C), temperature source Oral, resp. rate 19, height 5\' 9"  (1.753 m), weight 138 kg (304 lb 3.8 oz), SpO2 97.00%. Weight change:    2 of 6 systolic murmur. No gallop.  Markedly obese. Abdomen is soft.  Extremities no edema. Radial pulses are 2+ and symmetric. Carotids are 2+ without bruits.  Neuro exam demonstrates possible mild left-sided weakness  ASSESSMENT:  1. Sustained ventricular tachycardia, which in the past has been associated with myocardial ischemia. She has a bare-metal stent in the LAD.  2. Left-sided weakness, chronic for the past 6-8 months.  3. Significant anemia, hemoglobin 9.5, and in our office 2 weeks ago it was 11.3.  4. Poorly controlled diabetes  5. Significant hypertension  6. probable obstructive sleep apnea  7. Chronic kidney disease with possible acute component secondary to intravascular  volume depletion    Plan:  1. IV hydration  2. Will have a long conversation with the patient and family. She is at high risk for future vascular events.  3. Check stools for occult blood  4. Consider CT scan of the head although I worry about using contrast with current renal function  5. Coronary angiography scheduled for Monday morning at 7:30 AM  Signed, Veatrice Kells W 01/26/2012, 8:57 AM

## 2012-01-27 ENCOUNTER — Inpatient Hospital Stay (HOSPITAL_COMMUNITY): Payer: 59

## 2012-01-27 LAB — CBC
MCH: 29.9 pg (ref 26.0–34.0)
MCHC: 32.6 g/dL (ref 30.0–36.0)
MCV: 91.8 fL (ref 78.0–100.0)
Platelets: 309 10*3/uL (ref 150–400)
RDW: 14.4 % (ref 11.5–15.5)

## 2012-01-27 LAB — BASIC METABOLIC PANEL
CO2: 23 mEq/L (ref 19–32)
Calcium: 9.4 mg/dL (ref 8.4–10.5)
Creatinine, Ser: 1.4 mg/dL — ABNORMAL HIGH (ref 0.50–1.10)
Glucose, Bld: 152 mg/dL — ABNORMAL HIGH (ref 70–99)

## 2012-01-27 LAB — GLUCOSE, CAPILLARY

## 2012-01-27 NOTE — H&P (Signed)
Performed by Dr Mayford Knife on admission, should be in scanned documents

## 2012-01-27 NOTE — Progress Notes (Signed)
Patient Name: Courtney [name] Date of Encounter: 01/27/2012    SUBJECTIVE: The patient has had mild tightness in her chest intermittently. No palpitations. No monitor evidence of significant ventricular arrhythmia.  TELEMETRY:  Normal sinus rhythm with occasional PVC.: Filed Vitals:   01/26/12 1249 01/26/12 1630 01/26/12 2016 01/27/12 0449  BP: 127/65 133/78 119/70 106/67  Pulse: 68 57 67 71  Temp:  98.2 F (36.8 C) 98 F (36.7 C) 98.2 F (36.8 C)  TempSrc:  Oral Oral Oral  Resp: 20 20 19 18   Height:      Weight:      SpO2: 99% 97% 96% 96%    Intake/Output Summary (Last 24 hours) at 01/27/12 1028 Last data filed at 01/26/12 1700  Gross per 24 hour  Intake    240 ml  Output      0 ml  Net    240 ml    LABS: Basic Metabolic Panel:  Basename 01/26/12 1020 01/25/12 1943  NA 134* 136  K 4.9 4.6  CL 102 101  CO2 23 22  GLUCOSE 252* 127*  BUN 40* 36*  CREATININE 1.26* 1.59*  CALCIUM 9.1 9.5  MG -- --  PHOS -- --   CBC:  Basename 01/26/12 1020 01/25/12 1943 01/25/12 1713  WBC 7.1 10.2 --  NEUTROABS -- -- 5.1  HGB 9.3* 9.6* --  HCT 28.3* 29.3* --  MCV 91.3 91.3 --  PLT 267 296 --   Cardiac Enzymes:  Basename 01/26/12 0510 01/25/12 2212 01/25/12 1712  CKTOTAL 109 122 91  CKMB 1.7 2.1 2.2  CKMBINDEX -- -- --  TROPONINI <0.30 <0.30 <0.30   Hemoglobin A1C:  Basename 01/25/12 1713  HGBA1C 9.5*    Radiology/Studies:  No current studies.  Physical Exam: Blood pressure 106/67, pulse 71, temperature 98.2 F (36.8 C), temperature source Oral, resp. rate 18, height 5\' 9"  (1.753 m), weight 138 kg (304 lb 3.8 oz), SpO2 96.00%. Weight change:    S4 gallop.  Lungs clear.  She is up ambulating in the hall without difficulty.  ASSESSMENT:  1. 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 Symptoms are very similar to her presentation in 2010 when stenting was performed on her LAD with a bare-metal device.  2. Renal  insufficiency  3. Contrast allergy  4. Left-sided weakness, chronic  5. Poorly controlled diabetes  6. Hypertension with good control at the current time    Plan:  1. Continue IV hydration  2. PA lateral chest x-ray  3. Cardiac catheterization with prophylaxis for allergy to contrast. Schedule for 7:30 tomorrow morning. The procedure including the risk of allergy, death, stroke, MI, renal failure, bleeding, ischemic limb, among others were discussed in detail. We also discussed the possibility of stenting noting that with these procedures there is risk of emergency surgery, death, and other complications.  4. Long discussion with the patient and the husband concerning her multiple risk factors and high risk characteristics at such a young age.  Selinda Eon 01/27/2012, 10:28 AM

## 2012-01-28 ENCOUNTER — Encounter (HOSPITAL_COMMUNITY)
Admission: AD | Disposition: A | Discharge status: Home / Self Care | Payer: Self-pay | Source: Ambulatory Visit | Attending: Interventional Cardiology

## 2012-01-28 ENCOUNTER — Inpatient Hospital Stay (HOSPITAL_COMMUNITY): Payer: 59

## 2012-01-28 ENCOUNTER — Ambulatory Visit (HOSPITAL_COMMUNITY): Admit: 2012-01-28 | Payer: 59 | Admitting: Interventional Cardiology

## 2012-01-28 DIAGNOSIS — N289 Disorder of kidney and ureter, unspecified: Secondary | ICD-10-CM

## 2012-01-28 DIAGNOSIS — I2510 Atherosclerotic heart disease of native coronary artery without angina pectoris: Secondary | ICD-10-CM

## 2012-01-28 DIAGNOSIS — I5032 Chronic diastolic (congestive) heart failure: Secondary | ICD-10-CM

## 2012-01-28 DIAGNOSIS — I472 Ventricular tachycardia: Principal | ICD-10-CM

## 2012-01-28 HISTORY — PX: LEFT HEART CATHETERIZATION WITH CORONARY ANGIOGRAM: SHX5451

## 2012-01-28 LAB — GLUCOSE, CAPILLARY: Glucose-Capillary: 349 mg/dL — ABNORMAL HIGH (ref 70–99)

## 2012-01-28 LAB — CBC
HCT: 30 % — ABNORMAL LOW (ref 36.0–46.0)
Hemoglobin: 9.7 g/dL — ABNORMAL LOW (ref 12.0–15.0)
MCH: 29.8 pg (ref 26.0–34.0)
MCHC: 32.3 g/dL (ref 30.0–36.0)
MCV: 91.7 fL (ref 78.0–100.0)
RBC: 3.25 MIL/uL — ABNORMAL LOW (ref 3.87–5.11)
RDW: 14.3 % (ref 11.5–15.5)

## 2012-01-28 LAB — BASIC METABOLIC PANEL
BUN: 34 mg/dL — ABNORMAL HIGH (ref 6–23)
CO2: 20 mEq/L (ref 19–32)
Chloride: 106 mEq/L (ref 96–112)
Chloride: 104 mEq/L (ref 96–112)
Creatinine, Ser: 0.99 mg/dL (ref 0.50–1.10)
GFR calc Af Amer: 80 mL/min — ABNORMAL LOW (ref >90)
GFR calc non Af Amer: 69 mL/min — ABNORMAL LOW (ref >90)
Potassium: 5.7 mEq/L — ABNORMAL HIGH (ref 3.5–5.1)
Sodium: 134 mEq/L — ABNORMAL LOW (ref 135–145)

## 2012-01-28 LAB — PREGNANCY, URINE: Preg Test, Ur: NEGATIVE

## 2012-01-28 LAB — HEMOGLOBIN A1C
Hgb A1c MFr Bld: 9.7 % — ABNORMAL HIGH (ref <5.7)
Mean Plasma Glucose: 232 mg/dL — ABNORMAL HIGH (ref <117)

## 2012-01-28 LAB — CREATININE, SERUM
Creatinine, Ser: 1.01 mg/dL (ref 0.50–1.10)
GFR calc non Af Amer: 66 mL/min — ABNORMAL LOW (ref >90)

## 2012-01-28 LAB — SEDIMENTATION RATE: Sed Rate: 51 mm/hr — ABNORMAL HIGH (ref 0–22)

## 2012-01-28 SURGERY — LEFT HEART CATHETERIZATION WITH CORONARY ANGIOGRAM
Anesthesia: LOCAL

## 2012-01-28 MED ORDER — ONDANSETRON HCL 4 MG/2ML IJ SOLN: 4.0000 mg | Freq: Four times a day (QID) | INTRAMUSCULAR | Status: DC | PRN

## 2012-01-28 MED ORDER — LIDOCAINE HCL (PF) 1 % IJ SOLN
INTRAMUSCULAR | Status: AC
  Filled 2012-01-28: qty 30

## 2012-01-28 MED ORDER — FENTANYL CITRATE 0.05 MG/ML IJ SOLN
INTRAMUSCULAR | Status: AC
  Filled 2012-01-28: qty 2

## 2012-01-28 MED ORDER — VERAPAMIL HCL 2.5 MG/ML IV SOLN
INTRAVENOUS | Status: AC
  Filled 2012-01-28: qty 2

## 2012-01-28 MED ORDER — POLYETHYLENE GLYCOL 3350 17 G PO PACK
17.0000 g | PACK | Freq: Every day | ORAL | Status: DC | PRN
  Filled 2012-01-28: qty 1

## 2012-01-28 MED ORDER — MIDAZOLAM HCL 2 MG/2ML IJ SOLN
INTRAMUSCULAR | Status: AC
  Filled 2012-01-28: qty 2

## 2012-01-28 MED ORDER — HEPARIN SODIUM (PORCINE) 5000 UNIT/ML IJ SOLN
5000.0000 [IU] | Freq: Three times a day (TID) | INTRAMUSCULAR | Status: DC
  Administered 2012-01-28 – 2012-01-31 (×9): 5000 [IU] via SUBCUTANEOUS
  Filled 2012-01-28 (×12): qty 1

## 2012-01-28 MED ORDER — ASPIRIN 81 MG PO CHEW
81.0000 mg | CHEWABLE_TABLET | Freq: Every day | ORAL | Status: DC
  Administered 2012-01-29 – 2012-01-31 (×3): 81 mg via ORAL
  Filled 2012-01-28 (×3): qty 1

## 2012-01-28 MED ORDER — HEPARIN (PORCINE) IN NACL 2-0.9 UNIT/ML-% IJ SOLN
INTRAMUSCULAR | Status: AC
  Filled 2012-01-28: qty 2000

## 2012-01-28 MED ORDER — SODIUM CHLORIDE 0.9 % IV SOLN
INTRAVENOUS | Status: AC
  Administered 2012-01-28: 150 mL/h via INTRAVENOUS

## 2012-01-28 MED ORDER — NITROGLYCERIN 0.2 MG/ML ON CALL CATH LAB
INTRAVENOUS | Status: AC
  Filled 2012-01-28: qty 1

## 2012-01-28 NOTE — Consult Note (Signed)
Electrophysiology Consult Note    Patient ID: Courtney [name] MRN: [id number], DOB/AGE: [birth year] 46 y.o.  Admit date: 01/25/2012 Date of Consult: 01/28/2012  Primary Physician: [name], MD, MD Primary Cardiologist: Katrinka Blazing  Chief Complaint: vt Reason for Consultation: vt  HPI: [name] is a 46 year old woman with diabetes hypertension obesity renal insufficiency and a history of coronary disease prompting LAD stenting in 2010 that was associated apparently with symptomatic but presumably nonsustained ventricular tachycardia. She is also severely anemic at this time and required transfusion.  At that time she was having episodes of lightheadedness and diaphoresis. Following stenting the symptoms resolved for about 2-1/2 years only to begin to occur again started August 2012.   These have become increasingly frequent of late and are associated with any chest discomfort dyspnea nausea diaphoresis and presyncope. Because of these issues underwent a Myoview scanning in the fall that were normal. She had identified heart rate or blood pressure she 130s and 140s and received an event recorder. Always she had a symptomatic episode consistent with her more severe spells that correlated with nonsustained ventricular tachycardia at 46 beats per duration of about 10 seconds.  She was admitted and has ruled out for myocardial infarction she underwent catheterization today:>>> Widely patent proximal LAD circumflex and right coronary arteries. The patient does exhibit moderately severe diagonal, obtuse marginal 2 and 3, RCA LV branch and PDA 50-70% stenoses in the mid to distal vessel locations. There is diffuse diabetic disease though not severely obstructive in multiple branch and distal vessel sites; widely patent LAD stent; elevated LVEDP-21 mm; ejection fraction 65%.   She is also noted exercise intolerance manifested by shortness of breath. She's also had some neurological issues with  left-sided weakness and drifting to the left while walking. 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  She has struggled with weight loss. She has had of late intermittent diaphoresis and night sweats. She carries a history of arthritis dating back 10-15 years involving weightbearing joints.  She has not had rashes.  Past Medical History  Diagnosis Date  . Diabetes mellitus     with retinopathy, neuropathy and microalbuminemia  . Obesity   . Dyslipidemia   . Hypertension   . Coronary artery disease     s/p BMS 2010 LAD  . Dysrhythmia     ventricular tachycardia resolved after LAD stent and beta blocker      Surgical History:  Past Surgical History  Procedure Date  . Cholecystectomy 1990  . Pci lad 12/2010  . Colonoscopy 08/2011     Prescriptions prior to admission  Medication Sig Dispense Refill  . amLODipine-olmesartan (AZOR) 5-20 MG per tablet Take 1 tablet by mouth daily.      Marland Kitchen aspirin 325 MG tablet Take 325 mg by mouth daily.      . clopidogrel (PLAVIX) 75 MG tablet Take 75 mg by mouth daily.      . diclofenac (VOLTAREN) 75 MG EC tablet Take 75 mg by mouth 2 (two) times daily.      . ferrous sulfate 325 (65 FE) MG tablet Take 325 mg by mouth 2 (two) times daily with a meal.      . fish oil-omega-3 fatty acids 1000 MG capsule Take 2 g by mouth daily.      Marland Kitchen gabapentin (NEURONTIN) 300 MG capsule Take 300 mg by mouth 2 (two) times daily.      . insulin glargine (LANTUS) 100 UNIT/ML injection Inject  50 Units into the skin at bedtime.       . metFORMIN (GLUCOPHAGE) 1000 MG tablet Take 1,000 mg by mouth 2 (two) times daily with a meal.      . metoprolol (LOPRESSOR) 50 MG tablet Take 50 mg by mouth 2 (two) times daily.      . polyethylene glycol (MIRALAX / GLYCOLAX) packet Take 17 g by mouth daily.      . simvastatin (ZOCOR) 40 MG tablet Take 40 mg by mouth every evening.        Inpatient Medications:     . amLODipine  5 mg Oral  Daily  . aspirin  324 mg Oral Pre-Cath  . aspirin  81 mg Oral Daily  . aspirin  325 mg Oral Daily  . atorvastatin  20 mg Oral q1800  . diazepam  5 mg Oral On Call  . diphenhydrAMINE  25 mg Intravenous On Call  . famotidine (PEPCID) IV  20 mg Intravenous On Call  . fentaNYL      . ferrous sulfate  325 mg Oral BID WC  . gabapentin  300 mg Oral BID  . heparin      . heparin  5,000 Units Subcutaneous Q8H  . insulin aspart  0-15 Units Subcutaneous TID WC  . insulin glargine  50 Units Subcutaneous QHS  . lidocaine      . metoprolol  50 mg Oral BID  . midazolam      . nitroGLYCERIN      . omega-3 acid ethyl esters  1 g Oral Daily  . polyethylene glycol  17 g Oral Daily  . predniSONE  60 mg Oral Pre-Cath  . verapamil      . DISCONTD: clopidogrel  75 mg Oral Daily  . DISCONTD: heparin  5,000 Units Subcutaneous Q8H    Allergies:  Allergies  Allergen Reactions  . Ivp Dye (Iodinated Diagnostic Agents) Hives  . Ampicillin Rash    History   Social History  . Marital Status: Married    Spouse Name: N/A    Number of Children: N/A  . Years of Education: N/A   Occupational History  . Not on file.   Social History Main Topics  . Smoking status: Never Smoker   . Smokeless tobacco: Not on file  . Alcohol Use: Yes  . Drug Use:   . Sexually Active:    Other Topics Concern  . Not on file   Social History Narrative  . No narrative on file     Family History  Problem Relation Age of Onset  . Hypertension Father   . Diabetes Father   . Cancer Mother   . Diabetes Paternal Grandfather   . Hypertension Paternal Grandfather   . Diabetes Paternal Grandmother   . Hypertension Paternal Grandmother   . Cancer Maternal Grandmother     colon ca      Review of Systems:     Cardiac Review of Systems: {Y] = yes [ ]  = no  Chest Pain [  y  ]  Resting SOB [   ] Exertional SOB  Cove.Etienne  ]  [place].Mounts [  ]   Pedal Edema [   ]    Palpitations Cove.Etienne  ] Syncope  [  ]   Presyncope Cove.Etienne   ]  General  Review of Systems: [Y] = yes [  ]=no Constitional: recent weight change [  ]; anorexia [  ]; fatigue Cove.Etienne  ]; nausea [  ]; night sweats Cove.Etienne ];  fever [  ]; or chills [  ];                                                                                                                                          Dental: poor dentition[  ];   Eye : blurred vision [  ]; diplopia [   ]; vision changes [  ];  Amaurosis fugax[  ]; Resp: cough [  ];  wheezing[  ];  hemoptysis[  ]; shortness of breath[y ]; paroxysmal nocturnal dyspnea[  ]; dyspnea on exertion[  ]; or orthopnea[  ];  GI:  gallstones[  ], vomiting[  ];  dysphagia[  ]; melena[  ];  hematochezia [  ]; heartburn[  ];   Hx of  Colonoscopy[  ]; GU: kidney stones [  ]; hematuria[  ];   dysuria [  ];  nocturia[  ];  history of     obstruction [  ];                 Skin: rash, swelling[  ];, hair loss[  ];  peripheral edema[  ];  or itching[  ]; Musculosketetal: myalgias[  ];  joint swelling[  ];  joint erythema[  ];  joint pain[  ];  back pain[  ];  Heme/Lymph: bruising[  ];  bleeding[  ];  anemia[  ];  Neuro: TIA[  ];  headaches[  ];  stroke[  ];  vertigo[  ];  seizures[  ];   paresthesias[  ];  difficulty walking[  ];  Psych:depression[  ]; anxiety[  ];  Endocrine: diabetes[  ];  thyroid dysfunction[  ];  Immunizations: Flu [  ]; Pneumococcal[  ];  Physical Exam:  Blood pressure 133/75, pulse 75, temperature 97.5 F (36.4 C), temperature source Oral, resp. rate 18, height 5\' 9"  (1.753 m), weight 304 lb 3.8 oz (138 kg), last menstrual period 01/24/2012, SpO2 95.00%.  General appearance: alert, cooperative and no distress HEENT:PERRLA Neck: no adenopathy, no carotid bruit and no JVD Back: symmetric, no curvature. ROM normal. No CVA tenderness. Resp: clear to auscultation bilaterally Chest wall: no tenderness Cardio:regular rate & rhythm, S1 normal, 2/6 systolic ejection high pitched blowing murmur ULSB and fixed split S2 GI: soft, non-tender; bowel  sounds normal; no masses,  no organomegaly Extremities: extremities normal, atraumatic, no cyanosis or edema Pulses: 2+ and symmetric Skin: Skin color, texture, turgor normal. No rashes or lesions Lymph nodes: Cervical, supraclavicular, and axillary nodes normal. Neuro:Alert and oriented x3. Gait normal. Reflexes and motor strength normal and symmetric. Cranial nerves 2-12 and sensation grossly intact. Psychnormal affect and but intermittently tearful    WUJ:WJXBJY with epsilon waves  Event recorder demonstrated recurrent episodes of nonsustained ventricular tachycardia most of which appear to be of an inferior axis morphology but there is some polymorphic axis. The prolonged episode of 42 beats of monomorphic.  Basic Metabolic Panel:  Lab 01/28/12 1610 01/28/12 0710 01/27/12 1148 01/26/12 1020 01/25/12 1943 01/25/12 1713  NA -- 134* 136 134* 136 137  K -- 5.6* 4.7 4.9 4.6 5.1  CL -- 106 103 102 101 102  CO2 -- 20 23 23 22 24   GLUCOSE -- 313* 152* 252* 127* 128*  BUN -- 34* 41* 40* 36* 36*  CREATININE 1.01 0.99 1.40* 1.26* 1.59* 1.77*  CALCIUM -- 9.2 9.4 -- -- --  MG -- -- -- -- -- --  PHOS -- -- -- -- -- --   Cardiac Enzymes:  Basename 01/26/12 0510 01/25/12 2212 01/25/12 1712  CKTOTAL 109 122 91  CKMB 1.7 2.1 2.2  CKMBINDEX -- -- --  TROPONINI <0.30 <0.30 <0.30   CBC:  Lab 01/28/12 1212 01/28/12 0710 01/27/12 1148 01/26/12 1020 01/25/12 1943 01/25/12 1713  WBC 13.8* 12.1* 8.8 7.1 10.2 9.5  NEUTROABS -- -- -- -- -- 5.1  HGB 9.7* 9.7* 10.2* 9.3* 9.6* 9.8*  HCT 29.6* 30.0* 31.3* 28.3* 29.3* 29.6*  MCV 91.1 91.7 91.8 91.3 91.3 91.4  PLT 309 316 309 267 296 280   PROTIME: No results found for this basename: PROTIME in the last 72 hours BNP No components found with this basename: POCBNP:3 Liver Function Tests:  Basename 01/25/12 1713  AST 12  ALT 9  ALKPHOS 57  BILITOT 0.2*  PROT 6.8  ALBUMIN 3.5   D-Dimer: No results found for this basename: DDIMER:2 in the  last 72 hours Hemoglobin A1C:  Basename 01/25/12 1713  HGBA1C 9.5*   Fasting Lipid Panel: No results found for this basename: CHOL,HDL,LDLCALC,TRIG,CHOLHDL,LDLDIRECT in the last 72 hours Thyroid Function Tests:  Basename 01/25/12 1713  TSH 0.770  T4TOTAL --  T3FREE --  THYROIDAB --   Anemia Panel: No results found for this basename: VITAMINB12,FOLATE,FERRITIN,TIBC,IRON,RETICCTPCT in the last 72 hours     Radiology/Studies: Dg Chest 2 View  01/27/2012  *RADIOLOGY REPORT*  Clinical Data: Coronary artery disease.  The ventricular tachycardia.  Left sided chest pressure and shortness of breath.  CHEST - 2 VIEW  Comparison: Chest x-ray 02/01/2009.  Findings: Lung volumes are normal.  No consolidative airspace disease.  No pleural effusions.  No pneumothorax.  No pulmonary nodule or mass noted.  Pulmonary vasculature and the cardiomediastinal silhouette are within normal limits.  IMPRESSION: 1. No radiographic evidence of acute cardiopulmonary disease.  Original Report Authenticated By: Florencia Reasons, M.D.   Mr Brain Wo Contrast  01/28/2012  *RADIOLOGY REPORT*  Clinical Data: Multiple sclerosis.  Stroke.  Intracranial abnormality resulting in left arm and leg weakness.  The patient refuses contrast due to an iodinated contrast allergy.  MRI HEAD WITHOUT CONTRAST  Technique:  Multiplanar, multiecho pulse sequences of the brain and surrounding structures were obtained according to standard protocol without intravenous contrast.  Comparison: None.  Findings: No acute infarct, hemorrhage, mass lesion is present. There is no significant white matter disease.  The ventricles are of normal size.  No significant extra-axial fluid collection is present.  Flow is present in the major intracranial arteries.  The globes and orbits are intact.  The paranasal sinuses and mastoid air cells are clear.  IMPRESSION: Negative MRI of the brain.  Original Report Authenticated By: Jamesetta Orleans. MATTERN, M.D.        Patient Active Hospital Problem List: Paroxysmal ventricular tachycardia-non sustained  (01/28/2012)  * CAD (coronary artery disease) prior LAD BMS (01/28/2012)  ** Renal insufficiency (01/28/2012)   Chronic diastolic heart failure (01/28/2012)  The patient presented with documented nonsustained symptomatic ventricular tachycardia associated with presyncope. At first blush I would've thought that this was coming from the right ventricular outflow tract based on the morphology in the single-lead. In the past and seemed that it responded to beta blockers at the same time she had undergone LAD stenting.  I am bothered however by the polymorphic nature of some of these beats and this not withstanding Dr. Michaelle Copas feeling that there was not significant ischemia at catheterization. This morphology suggests a more worrisome process as opposed to RVOT VT.  To that end we will undertake a signal average ECG; also ordered MRI although this may be weight limited to see if we can identify structural heart disease to potentially explain the findings. I agree with ongoing use of beta blockers and would probably switch the amlodipine to a non- dihydropyridine which may better in fact some of the calcium accumulation which is thought to be the mechanism of outflow tract tachycardia.  I also worry as to whether there is a underlying process to explain the arthritis neurological weakness and a ventricular arrhythmia. She is also profoundly anemic. Today I will also check a sedimentation rate and at the time of her cardiac MRI we can look at lymphadenopathy in the thorax as a possible manifestation of sarcoid  Her K was elevated this am;  Will repeat this pm  Signed, Sherryl Manges MD

## 2012-01-28 NOTE — CV Procedure (Signed)
     Diagnostic Cardiac Catheterization Report  Courtney [name]  46 y.o.  female September 21, 1966  Procedure Date: 01/28/2012  Referring Physician: Dr. Wynelle Link   Primary Cardiologist:: Gwynneth Albright, M.D.   PROCEDURE:  Left heart catheterization with selective coronary angiography, left ventriculogram.  INDICATIONS:  Ventricular tachycardia, prior LAD stent, near syncope, and poorly controlled diabetes. The studies being done to rule out the possibility of ischemia mediated ventricular tachycardia.  The risks, benefits, and details of the procedure were explained to the patient.  The patient verbalized understanding and wanted to proceed.  Informed written consent was obtained.  PROCEDURE TECHNIQUE:  After Xylocaine anesthesia a 5 French sheath was placed in the right radial artery with a single anterior needle wall stick.   Coronary angiography was done using a 5 [place] A2MP catheter.  Left ventriculography was done using a 5 [place] A2 MP catheter.    CONTRAST:  Total of 110 cc.  COMPLICATIONS:  None.    HEMODYNAMICS:  Aortic pressure was 137/85 mmHg; LV pressure was 143/13 mmHg; LVEDP 21 mm mercury.  There was no gradient between the left ventricle and aorta.    ANGIOGRAPHIC DATA:   The left main coronary artery is widely patent.  The left anterior descending artery is dominant and transapical. 2 diagonal branches. The mid LAD bare-metal stent is widely patent. Ostial 30% LAD and distal segmental 40% stenoses are noted. Diagonal #2 is relatively small in caliber and contains an 80% mid vessel stenosis. The distribution is relatively small. This is a vessel that is not a reasonable target for PCI.  The left circumflex artery is 4 obtuse marginal branches. The first is large. The distal 3 branches are relatively small and diffuse diseased. The first diagonal contains no obstruction. The second diagonal contains proximal to mid diffuse disease up to 60-70%. The third diagonal  contains a mid 50-70% stenosis. The third and second obtuse marginal branch is a small and not reasonable size for PCI. The proximal circumflex contains eccentric 40% narrowing and there is generalized 50-60% narrowing within the mid segment of the circumflex overlapping the second obtuse marginal.  The right coronary artery is dominant. No significant obstruction is seen. The PDA contains mid luminal irregularities. The LV branch contains luminal irregularities as well with distal 70-80% obstruction.Marland Kitchen  LEFT VENTRICULOGRAM:  Left ventricular angiogram was done in the 30 RAO projection and revealed normal left ventricular wall motion and systolic function with an estimated ejection fraction of 65 %.  LVEDP was 21 mmHg.  IMPRESSIONS:  1. Widely patent proximal LAD circumflex and right coronary arteries. The patient does exhibit moderately severe diagonal, obtuse marginal 2 and 3, RCA LV branch and PDA 50-70% stenoses in the mid to distal vessel locations.  There is diffuse diabetic disease though not severely obstructive in multiple branch and distal vessel sites.  2. Widely patent mid LAD stent.  3. Normal left ventricular function with elevated LVEDP consistent with diastolic heart failure  4. Ventricular tachycardia, uncertain cause. No evidence any large regions of ischemia, especially that could occur at rest and cause ventricular tachycardia as noted on the event monitor.    RECOMMENDATION: 1. EP consultation concerning ventricular tachycardia and near syncope.  2. Neurology consultation for left-sided weakness present now for several months.  3. Monitor renal function  4. Aggressive risk factor modification

## 2012-01-28 NOTE — Progress Notes (Signed)
Inpatient Diabetes Program Recommendations  AACE/ADA: New Consensus Statement on Inpatient Glycemic Control (2009)  Target Ranges:  Prepandial:   less than 140 mg/dL      Peak postprandial:   less than 180 mg/dL (1-2 hours)      Critically ill patients:  140 - 180 mg/dL   Reason for Visit: Hyperglycemia  Results for Courtney [name], Courtney [name] (MRN [id number]) as of 01/28/2012 10:02  Ref. Range 01/26/2012 10:20 01/27/2012 11:48 01/28/2012 07:10  Glucose Latest Range: 70-99 mg/dL 782 (H) 956 (H) 213 (H)  Results for Courtney [name], Courtney [name] (MRN [id number]) as of 01/28/2012 10:02  Ref. Range 01/26/2012 21:45 01/27/2012 05:06 01/27/2012 11:44 01/27/2012 16:39 01/27/2012 22:24 01/28/2012 06:09 01/28/2012 08:37  Glucose-Capillary Latest Range: 70-99 mg/dL 629 (H) 528 (H) 413 (H) 192 (H) 384 (H) 317 (H) 292 (H)    Inpatient Diabetes Program Recommendations Insulin - Meal Coverage: Add Novolog 4 units tidwc when diet is advanced  Note: Will need adjustment in diabetes meds prior to discharge.  Would probably benefit from Out-patient Diabetes Education consult.

## 2012-01-28 NOTE — Progress Notes (Signed)
Patient Name: Courtney [name] Date of Encounter: 01/28/2012    SUBJECTIVE: Successfully completed catheterization earlier today. No complications. Did not thigh and high grade coronary disease that would explain ventricular tachycardia. I am also still concerned about the patient's complaint of left-sided weakness.  TELEMETRY:  Normal sinus rhythm: Filed Vitals:   01/27/12 1700 01/27/12 2227 01/28/12 0535 01/28/12 0736  BP: 128/75 135/80 134/79   Pulse: 72 77 74 68  Temp: 98.2 F (36.8 C)  97.6 F (36.4 C)   TempSrc: Oral Oral Oral   Resp: 20 19 20    Height:      Weight:      SpO2: 98% 93% 97%    No intake or output data in the 24 hours ending 01/28/12 1225  LABS: Basic Metabolic Panel:  Basename 01/28/12 0710 01/27/12 1148  NA 134* 136  K 5.6* 4.7  CL 106 103  CO2 20 23  GLUCOSE 313* 152*  BUN 34* 41*  CREATININE 0.99 1.40*  CALCIUM 9.2 9.4  MG -- --  PHOS -- --   CBC:  Basename 01/28/12 0710 01/27/12 1148 01/25/12 1713  WBC 12.1* 8.8 --  NEUTROABS -- -- 5.1  HGB 9.7* 10.2* --  HCT 30.0* 31.3* --  MCV 91.7 91.8 --  PLT 316 309 --   Cardiac Enzymes:  Basename 01/26/12 0510 01/25/12 2212 01/25/12 1712  CKTOTAL 109 122 91  CKMB 1.7 2.1 2.2  CKMBINDEX -- -- --  TROPONINI <0.30 <0.30 <0.30   Hemoglobin A1C:  Basename 01/25/12 1713  HGBA1C 9.5*   Radiology/Studies:  No acute disease noted  Physical Exam: Blood pressure 134/79, pulse 68, temperature 97.6 F (36.4 C), temperature source Oral, resp. rate 20, height 5\' 9"  (1.753 m), weight 138 kg (304 lb 3.8 oz), last menstrual period 01/24/2012, SpO2 97.00%. Weight change:    Lungs clear.  Cardiac exam reveals no rub or gallop.  Extremities no edema. 2+ bilateral radial pulses.  ASSESSMENT:  1. Ventricular tachycardia, nonsustained  2. Coronary atherosclerotic heart disease, with catheterization today demonstrating diffuse distal vessel disease but not high-grade critical obstruction in any  particular territory.  3. Complaining of left-sided weakness  4. Poorly controlled diabetes. Hemoglobin A1c 9.5.  5. Renal insufficiency, improving with hydration.  Plan:  1. Electrophysiology consult  2. Neurology consult  3. Continue IV hydration x24 hours  Selinda Eon 01/28/2012, 12:25 PM

## 2012-01-28 NOTE — Progress Notes (Signed)
*  PRELIMINARY RESULTS* Echocardiogram 2D Echocardiogram has been performed.  Courtney [name] 01/28/2012, 3:55 PM

## 2012-01-28 NOTE — Progress Notes (Signed)
UR Completed. Simmons, Jorden Minchey F [phone]  

## 2012-01-28 NOTE — Consult Note (Signed)
[facility] CONSULT NOTE     Reason for Consult: progressive left sided weakness    HPI:    Courtney [name] is an 46 y.o. female who has had Left leg and arm weakness for 6 month or greater.  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  She states she has had a EMG/NCV study in the past which was abnormal but could not give me details.   She states over the past few months she has noted she cannot walk as long of distances without getting tired but denies any diplopia, difficulty swallowing, SOB and states the weakness is mostly located over the left arm and leg.  No acute changes over the past month.   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 Diabetes mellitus     with retinopathy, neuropathy and microalbuminemia  . Obesity   . Dyslipidemia   . Hypertension   . Coronary artery disease     s/p BMS 2010 LAD  . Dysrhythmia     ventricular tachycardia resolved after LAD stent and beta blocker    Past Surgical History  Procedure Date  . Cholecystectomy 1990  . Pci lad 12/2010  . Colonoscopy 08/2011    Family History  Problem Relation Age of Onset  . Hypertension Father   . Diabetes Father   . Cancer Mother   . Diabetes Paternal Grandfather   . Hypertension Paternal Grandfather   . Diabetes Paternal Grandmother   . Hypertension Paternal Grandmother   . Cancer Maternal Grandmother     colon ca    Social History:  reports that she has never smoked. She does not have any smokeless tobacco history on file. She reports that she drinks alcohol. Her drug history not on file.  Allergies  Allergen Reactions  . Ivp Dye (Iodinated Diagnostic Agents) Hives  . Ampicillin Rash    Medications:    Prior to Admission:  Prescriptions prior to admission  Medication Sig Dispense Refill  .  amLODipine-olmesartan (AZOR) 5-20 MG per tablet Take 1 tablet by mouth daily.      Marland Kitchen aspirin 325 MG tablet Take 325 mg by mouth daily.      . clopidogrel (PLAVIX) 75 MG tablet Take 75 mg by mouth daily.      . diclofenac (VOLTAREN) 75 MG EC tablet Take 75 mg by mouth 2 (two) times daily.      . ferrous sulfate 325 (65 FE) MG tablet Take 325 mg by mouth 2 (two) times daily with a meal.      . fish oil-omega-3 fatty acids 1000 MG capsule Take 2 g by mouth daily.      Marland Kitchen gabapentin (NEURONTIN) 300 MG capsule Take 300 mg by mouth 2 (two) times daily.      . insulin glargine (LANTUS) 100 UNIT/ML injection Inject 50 Units into the skin at bedtime.       . metFORMIN (GLUCOPHAGE) 1000 MG tablet Take 1,000 mg by mouth 2 (two) times daily with a meal.      . metoprolol (LOPRESSOR) 50 MG tablet Take 50 mg by mouth 2 (two) times daily.      . polyethylene glycol (MIRALAX / GLYCOLAX) packet Take 17 g by mouth daily.      Marland Kitchen  simvastatin (ZOCOR) 40 MG tablet Take 40 mg by mouth every evening.       Scheduled:   . amLODipine  5 mg Oral Daily  . aspirin  324 mg Oral Pre-Cath  . aspirin  325 mg Oral Daily  . atorvastatin  20 mg Oral q1800  . clopidogrel  75 mg Oral Daily  . diazepam  5 mg Oral On Call  . diphenhydrAMINE  25 mg Intravenous On Call  . famotidine (PEPCID) IV  20 mg Intravenous On Call  . fentaNYL      . ferrous sulfate  325 mg Oral BID WC  . gabapentin  300 mg Oral BID  . heparin      . heparin  5,000 Units Subcutaneous Q8H  . insulin aspart  0-15 Units Subcutaneous TID WC  . insulin glargine  50 Units Subcutaneous QHS  . lidocaine      . metoprolol  50 mg Oral BID  . midazolam      . nitroGLYCERIN      . omega-3 acid ethyl esters  1 g Oral Daily  . polyethylene glycol  17 g Oral Daily  . predniSONE  60 mg Oral Pre-Cath   Followed by  . predniSONE  60 mg Oral Pre-Cath  . verapamil        Review of Systems - General ROS: negative for - chills, fatigue, fever or hot  flashes Hematological and Lymphatic ROS: negative for - bruising, fatigue, jaundice or pallor Endocrine ROS: negative for - hair pattern changes, hot flashes, mood swings or skin changes Respiratory ROS: negative for - cough, hemoptysis, orthopnea or wheezing Cardiovascular ROS: negative for - dyspnea on exertion, orthopnea, palpitations or shortness of breath Gastrointestinal ROS: negative for - abdominal pain, appetite loss, blood in stools, diarrhea or hematemesis Musculoskeletal ROS: negative for - joint pain, joint stiffness, joint swelling or muscle pain Neurological ROS: See HPI Dermatological ROS: negative for dry skin, pruritus and rash   Blood pressure 134/79, pulse 74, temperature 97.6 F (36.4 C), temperature source Oral, resp. rate 20, height 5\' 9"  (1.753 m), weight 138 kg (304 lb 3.8 oz), last menstrual period 01/24/2012, SpO2 97.00%.   Neurologic Examination:  Mental Status: Alert, oriented, thought content appropriate.  Speech fluent without evidence of aphasia. Able to follow 3 step commands without difficulty. Cranial Nerves: II-Visual fields grossly intact. III/IV/VI-Extraocular movements intact.  Pupils reactive bilaterally. V/VII-Smile symmetric VIII-grossly intact IX/X-normal gag XI-bilateral shoulder shrug XII-midline tongue extension Motor: 5/5 bilaterally with normal tone and bulk Sensory: Pinprick and light touch intact throughout, bilaterally with decreased sensation/vibration in stocking distribution.  Deep Tendon Reflexes: 1+ and symmetric throughout Plantars: Down going left and up going on the right Cerebellar: Normal finger-to-nose, normal rapid alternating movements and normal heel-to-shin test.    No results found for this basename: cbc, bmp, coags, chol, tri, ldl, hga1c    Results for orders placed during the hospital encounter of 01/25/12 (from the past 48 hour(s))  GLUCOSE, CAPILLARY     Status: Abnormal   Collection Time   01/26/12 12:10 PM       Component Value Range Comment   Glucose-Capillary 194 (*) 70 - 99 (mg/dL)   GLUCOSE, CAPILLARY     Status: Abnormal   Collection Time   01/26/12  5:26 PM      Component Value Range Comment   Glucose-Capillary 226 (*) 70 - 99 (mg/dL)   GLUCOSE, CAPILLARY     Status: Abnormal   Collection Time  01/26/12  9:45 PM      Component Value Range Comment   Glucose-Capillary 212 (*) 70 - 99 (mg/dL)    Comment 1 Documented in Chart      Comment 2 Notify RN     GLUCOSE, CAPILLARY     Status: Abnormal   Collection Time   01/27/12  5:06 AM      Component Value Range Comment   Glucose-Capillary 176 (*) 70 - 99 (mg/dL)    Comment 1 Documented in Chart      Comment 2 Notify RN     GLUCOSE, CAPILLARY     Status: Abnormal   Collection Time   01/27/12 11:44 AM      Component Value Range Comment   Glucose-Capillary 152 (*) 70 - 99 (mg/dL)    Comment 1 Notify RN     BASIC METABOLIC PANEL     Status: Abnormal   Collection Time   01/27/12 11:48 AM      Component Value Range Comment   Sodium 136  135 - 145 (mEq/L)    Potassium 4.7  3.5 - 5.1 (mEq/L)    Chloride 103  96 - 112 (mEq/L)    CO2 23  19 - 32 (mEq/L)    Glucose, Bld 152 (*) 70 - 99 (mg/dL)    BUN 41 (*) 6 - 23 (mg/dL)    Creatinine, Ser 5.62 (*) 0.50 - 1.10 (mg/dL)    Calcium 9.4  8.4 - 10.5 (mg/dL)    GFR calc non Af Amer 44 (*) >90 (mL/min)    GFR calc Af Amer 51 (*) >90 (mL/min)   CBC     Status: Abnormal   Collection Time   01/27/12 11:48 AM      Component Value Range Comment   WBC 8.8  4.0 - 10.5 (K/uL)    RBC 3.41 (*) 3.87 - 5.11 (MIL/uL)    Hemoglobin 10.2 (*) 12.0 - 15.0 (g/dL)    HCT 13.0 (*) 86.5 - 46.0 (%)    MCV 91.8  78.0 - 100.0 (fL)    MCH 29.9  26.0 - 34.0 (pg)    MCHC 32.6  30.0 - 36.0 (g/dL)    RDW 78.4  69.6 - 29.5 (%)    Platelets 309  150 - 400 (K/uL)   GLUCOSE, CAPILLARY     Status: Abnormal   Collection Time   01/27/12  4:39 PM      Component Value Range Comment   Glucose-Capillary 192 (*) 70 - 99 (mg/dL)     Comment 1 Notify RN     GLUCOSE, CAPILLARY     Status: Abnormal   Collection Time   01/27/12 10:24 PM      Component Value Range Comment   Glucose-Capillary 384 (*) 70 - 99 (mg/dL)    Comment 1 Notify RN     GLUCOSE, CAPILLARY     Status: Abnormal   Collection Time   01/28/12  6:09 AM      Component Value Range Comment   Glucose-Capillary 317 (*) 70 - 99 (mg/dL)    Comment 1 Notify RN     PREGNANCY, URINE     Status: Normal   Collection Time   01/28/12  6:54 AM      Component Value Range Comment   Preg Test, Ur NEGATIVE  NEGATIVE    BASIC METABOLIC PANEL     Status: Abnormal   Collection Time   01/28/12  7:10 AM      Component Value  Range Comment   Sodium 134 (*) 135 - 145 (mEq/L)    Potassium 5.6 (*) 3.5 - 5.1 (mEq/L)    Chloride 106  96 - 112 (mEq/L)    CO2 20  19 - 32 (mEq/L)    Glucose, Bld 313 (*) 70 - 99 (mg/dL)    BUN 34 (*) 6 - 23 (mg/dL)    Creatinine, Ser 1.61  0.50 - 1.10 (mg/dL)    Calcium 9.2  8.4 - 10.5 (mg/dL)    GFR calc non Af Amer 67 (*) >90 (mL/min)    GFR calc Af Amer 78 (*) >90 (mL/min)   CBC     Status: Abnormal   Collection Time   01/28/12  7:10 AM      Component Value Range Comment   WBC 12.1 (*) 4.0 - 10.5 (K/uL)    RBC 3.27 (*) 3.87 - 5.11 (MIL/uL)    Hemoglobin 9.7 (*) 12.0 - 15.0 (g/dL)    HCT 09.6 (*) 04.5 - 46.0 (%)    MCV 91.7  78.0 - 100.0 (fL)    MCH 29.7  26.0 - 34.0 (pg)    MCHC 32.3  30.0 - 36.0 (g/dL)    RDW 40.9  81.1 - 91.4 (%)    Platelets 316  150 - 400 (K/uL)   GLUCOSE, CAPILLARY     Status: Abnormal   Collection Time   01/28/12  8:37 AM      Component Value Range Comment   Glucose-Capillary 292 (*) 70 - 99 (mg/dL)     Dg Chest 2 View  7/82/9562  *RADIOLOGY REPORT*  Clinical Data: Coronary artery disease.  The ventricular tachycardia.  Left sided chest pressure and shortness of breath.  CHEST - 2 VIEW  Comparison: Chest x-ray 02/01/2009.  Findings: Lung volumes are normal.  No consolidative airspace disease.  No pleural  effusions.  No pneumothorax.  No pulmonary nodule or mass noted.  Pulmonary vasculature and the cardiomediastinal silhouette are within normal limits.  IMPRESSION: 1. No radiographic evidence of acute cardiopulmonary disease.  Original Report Authenticated By: Florencia Reasons, M.D.     Assessment/Plan:   46 YO female with 4-6 month history of left arm and leg weakness which seems to have become slightly worse over the last month.  Patient has history of long standing DM with retinopathy, neuropathy and microalbumiinuria.   Recommend: 1) MRI head without contrast  2) B12 3) HbA1c 4) follow up with either [facility] or [facility] for further follow up after discharge.     Felicie Morn PA-C [facility] [phone]  01/28/2012, 10:35 AM

## 2012-01-28 NOTE — H&P (Signed)
There were no problems overnight. Cath is being done because of episodes of nonsustained ventricular tachycardia, history of presyncope, and prior history of coronary disease with stenting after presenting with sustained ventricular tachycardia in 2010. Redo in the study to rule out restenosis and/or progression of coronary disease . I had a long discussion with the patient and her family yesterday including a discussion of the risks, the pros and cons of DES versus bare-metal stenting, the impact of anemia, poorly controlled diabetes, and renal impairment.

## 2012-01-28 NOTE — Brief Op Note (Signed)
01/25/2012 - 01/28/2012  8:28 AM  PATIENT:  Courtney [name]  46 y.o. female  PRE-OPERATIVE DIAGNOSIS:  Chest pain  POST-OPERATIVE DIAGNOSIS:  And LAD stent with moderate disePOST-OPERATIVE DIAGNOSIS:  And LAD stent with moderate dise distal vessel ase in the circumflex  distal vessel ase in the circumflex PROCEDURE:  Procedure(s) (LRB): LEFT HEART CATHETERIZATION WITH CORONARY ANGIOGRAM (N/A)  SURGEON:  Surgeon(s) and Role:    * Lesleigh Noe, MD - Primary  PHYSICIAN ASSISTANT:   ASSISTANTS: none   ANESTHESIA:   IV sedation  EBL:     BLOOD ADMINISTERED:none

## 2012-01-29 ENCOUNTER — Inpatient Hospital Stay (HOSPITAL_COMMUNITY): Payer: 59

## 2012-01-29 LAB — GLUCOSE, CAPILLARY
Glucose-Capillary: 182 mg/dL — ABNORMAL HIGH (ref 70–99)
Glucose-Capillary: 157 mg/dL — ABNORMAL HIGH (ref 70–99)
Glucose-Capillary: 131 mg/dL — ABNORMAL HIGH (ref 70–99)
Glucose-Capillary: 223 mg/dL — ABNORMAL HIGH (ref 70–99)

## 2012-01-29 LAB — FERRITIN: Ferritin: 18 ng/mL (ref 10–291)

## 2012-01-29 LAB — BASIC METABOLIC PANEL WITH GFR
BUN: 23 mg/dL (ref 6–23)
CO2: 19 meq/L (ref 19–32)
Calcium: 8.4 mg/dL (ref 8.4–10.5)
Chloride: 111 meq/L (ref 96–112)
Creatinine, Ser: 0.81 mg/dL (ref 0.50–1.10)
GFR calc Af Amer: >90 mL/min
GFR calc non Af Amer: 86 mL/min — ABNORMAL LOW
Glucose, Bld: 222 mg/dL — ABNORMAL HIGH (ref 70–99)
Potassium: 4.7 meq/L (ref 3.5–5.1)
Sodium: 136 meq/L (ref 135–145)

## 2012-01-29 LAB — IRON AND TIBC
Iron: 50 ug/dL (ref 42–135)
Saturation Ratios: 19 % — ABNORMAL LOW (ref 20–55)
TIBC: 259 ug/dL (ref 250–470)
UIBC: 209 ug/dL (ref 125–400)

## 2012-01-29 LAB — ANGIOTENSIN CONVERTING ENZYME: Angiotensin-Converting Enzyme: 30 U/L (ref 8–52)

## 2012-01-29 MED ORDER — PREDNISONE 50 MG PO TABS
50.0000 mg | ORAL_TABLET | Freq: Four times a day (QID) | ORAL | Status: AC
  Administered 2012-01-30 (×3): 50 mg via ORAL
  Filled 2012-01-29 (×3): qty 1

## 2012-01-29 MED ORDER — ISOSORBIDE MONONITRATE ER 60 MG PO TB24
60.0000 mg | ORAL_TABLET | Freq: Every day | ORAL | Status: DC
  Administered 2012-01-29 – 2012-01-31 (×3): 60 mg via ORAL
  Filled 2012-01-29 (×3): qty 1

## 2012-01-29 MED ORDER — DIPHENHYDRAMINE HCL 50 MG PO CAPS
50.0000 mg | ORAL_CAPSULE | Freq: Once | ORAL | Status: AC
  Administered 2012-01-30: 50 mg via ORAL
  Filled 2012-01-29: qty 1

## 2012-01-29 MED ORDER — METOPROLOL TARTRATE 100 MG PO TABS
100.0000 mg | ORAL_TABLET | Freq: Two times a day (BID) | ORAL | Status: DC
  Administered 2012-01-29 – 2012-01-31 (×5): 100 mg via ORAL
  Filled 2012-01-29 (×6): qty 1

## 2012-01-29 MED FILL — White Petrolatum Gel: Qty: 28.35 | Status: AC

## 2012-01-29 NOTE — Progress Notes (Signed)
  Patient Name: Courtney [name]      SUBJECTIVE: quiet night  Past Medical History  Diagnosis Date  . Diabetes mellitus     with retinopathy, neuropathy and microalbuminemia  . Obesity   . Dyslipidemia   . Hypertension   . Coronary artery disease     s/p BMS 2010 LAD  . Dysrhythmia     ventricular tachycardia resolved after LAD stent and beta blocker    PHYSICAL EXAM Filed Vitals:   01/28/12 0736 01/28/12 1415 01/28/12 2100 01/29/12 0657  BP:  133/75 157/74 134/72  Pulse: 68 75 84 69  Temp:  97.5 F (36.4 C) 98.5 F (36.9 C) 98.2 F (36.8 C)  TempSrc:  Oral Oral Oral  Resp:  18 20 18   Height:      Weight:      SpO2:  95% 97% 96%    Well developed and nourished in no acute distress HENT normal Neck supple with JVP-flat Clear Regular rate and rhythm, no murmurs or gallops Abd-soft with active BS No Clubbing cyanosis edema Skin-warm and dry A & Oriented  Grossly normal sensory and motor function   TELEMETRY: Reviewed telemetry pt in sinus wi couplets   Intake/Output Summary (Last 24 hours) at 01/29/12 0837 Last data filed at 01/28/12 2100  Gross per 24 hour  Intake    840 ml  Output      0 ml  Net    840 ml    LABS: Basic Metabolic Panel:  Lab 01/29/12 4098 01/28/12 1805 01/28/12 1212 01/28/12 0710 01/27/12 1148 01/26/12 1020 01/25/12 1943 01/25/12 1713  NA 136 134* -- 134* 136 134* 136 137  K 4.7 5.7* -- 5.6* 4.7 4.9 4.6 5.1  CL 111 104 -- 106 103 102 101 102  CO2 19 20 -- 20 23 23 22 24   GLUCOSE 222* 329* -- 313* 152* 252* 127* 128*  BUN 23 30* -- 34* 41* 40* 36* 36*  CREATININE 0.81 0.97 1.01 0.99 1.40* 1.26* 1.59* --  CALCIUM 8.4 8.9 -- -- -- -- -- --  MG -- -- -- -- -- -- -- --  PHOS -- -- -- -- -- -- -- --   Cardiac Enzymes: No results found for this basename: CKTOTAL:3,CKMB:3,CKMBINDEX:3,TROPONINI:3 in the last 72 hours CBC:  Lab 01/28/12 1212 01/28/12 0710 01/27/12 1148 01/26/12 1020 01/25/12 1943 01/25/12 1713  WBC 13.8* 12.1*  8.8 7.1 10.2 9.5  NEUTROABS -- -- -- -- -- 5.1  HGB 9.7* 9.7* 10.2* 9.3* 9.6* 9.8*  HCT 29.6* 30.0* 31.3* 28.3* 29.3* 29.6*  MCV 91.1 91.7 91.8 91.3 91.3 91.4  PLT 309 316 309 267 296 280   Hemoglobin A1C:  Basename 01/28/12 1212  HGBA1C 9.7*   Fasting Lipid Panel: No results found for this basename: CHOL,HDL,LDLCALC,TRIG,CHOLHDL,LDLDIRECT in the last 72 hours Thyroid Function Tests: No results found for this basename: TSH,T4TOTAL,FREET3,T3FREE,THYROIDAB in the last 72 hours Anemia Panel:  Basename 01/28/12 1212  VITAMINB12 518  FOLATE --  FERRITIN --  TIBC --  IRON --  RETICCTPCT --      ASSESSMENT AND PLAN:  Patient Active Hospital Problem List: Paroxysmal ventricular tachycardia-non sustained  (01/28/2012)   CAD (coronary artery disease) prior LAD BMS (01/28/2012)   Hyperkalemia  improved  * Chronic diastolic heart failure (01/28/2012)   Will discuss with Dr HS re mechanisms of VT   SACECG./MRI pending Aggressive anti ischemic Rx  ? Add nitrates per HS   Signed, Sherryl Manges MD  01/29/2012

## 2012-01-29 NOTE — Progress Notes (Signed)
Patient Name: Courtney [name] Date of Encounter: 01/29/2012    SUBJECTIVE:Feels well. Appreciate the thoughts of consultants. Discussed with the patient.  TELEMETRY:  NSR: Filed Vitals:   01/28/12 0736 01/28/12 1415 01/28/12 2100 01/29/12 0657  BP:  133/75 157/74 134/72  Pulse: 68 75 84 69  Temp:  97.5 F (36.4 C) 98.5 F (36.9 C) 98.2 F (36.8 C)  TempSrc:  Oral Oral Oral  Resp:  18 20 18   Height:      Weight:      SpO2:  95% 97% 96%    Intake/Output Summary (Last 24 hours) at 01/29/12 0913 Last data filed at 01/28/12 2100  Gross per 24 hour  Intake    840 ml  Output      0 ml  Net    840 ml    LABS: Basic Metabolic Panel:  Basename 01/29/12 0500 01/28/12 1805  NA 136 134*  K 4.7 5.7*  CL 111 104  CO2 19 20  GLUCOSE 222* 329*  BUN 23 30*  CREATININE 0.81 0.97  CALCIUM 8.4 8.9  MG -- --  PHOS -- --   CBC:  Basename 01/28/12 1212 01/28/12 0710  WBC 13.8* 12.1*  NEUTROABS -- --  HGB 9.7* 9.7*  HCT 29.6* 30.0*  MCV 91.1 91.7  PLT 309 316   Hemoglobin A1C:  Basename 01/28/12 1212  HGBA1C 9.7*   Fasting Lipid Panel: No results found for this basename: CHOL,HDL,LDLCALC,TRIG,CHOLHDL,LDLDIRECT in the last 72 hours  Radiology/Studies:  MRI HEAD WITHOUT CONTRAST  Technique: Multiplanar, multiecho pulse sequences of the brain and  surrounding structures were obtained according to standard protocol  without intravenous contrast.  Comparison: None.  Findings: No acute infarct, hemorrhage, mass lesion is present.  There is no significant white matter disease. The ventricles are  of normal size.  No significant extra-axial fluid collection is present.  Flow is present in the major intracranial arteries. The globes and  orbits are intact. The paranasal sinuses and mastoid air cells are  clear.  IMPRESSION:  Negative MRI of the brain.   Physical Exam: Blood pressure 134/72, pulse 69, temperature 98.2 F (36.8 C), temperature source Oral, resp.  rate 18, height 5\' 9"  (1.753 m), weight 138 kg (304 lb 3.8 oz), last menstrual period 01/24/2012, SpO2 96.00%. Weight change:    Obesity.  Cath site unremarkable.  Lungs clear and cardiac exam unremarkable.  ASSESSMENT:  1. Nonsustained ventricular tachycardia with polymorphic features.  2. Diffuse diabetic coronary artery disease involving primarily small side branches without large areas that are potentially ischemic  3. Normal left ventricular function and structure by echo.  4. Negative neurological evaluation.   Plan:  1. Add long-acting nitrates  2. Discontinue amlodipine  3. Further increase beta blocker therapy.   4. Complete evaluation for VT.  Selinda Eon 01/29/2012, 9:13 AM

## 2012-01-30 ENCOUNTER — Inpatient Hospital Stay (HOSPITAL_COMMUNITY): Payer: 59

## 2012-01-30 ENCOUNTER — Other Ambulatory Visit: Payer: Self-pay

## 2012-01-30 LAB — GLUCOSE, CAPILLARY
Glucose-Capillary: 273 mg/dL — ABNORMAL HIGH (ref 70–99)
Glucose-Capillary: 285 mg/dL — ABNORMAL HIGH (ref 70–99)

## 2012-01-30 MED ORDER — GADOBENATE DIMEGLUMINE 529 MG/ML IV SOLN
30.0000 mL | Freq: Once | INTRAVENOUS | Status: AC
  Administered 2012-01-30: 30 mL via INTRAVENOUS

## 2012-01-30 MED ORDER — ZOLPIDEM TARTRATE 5 MG PO TABS
10.0000 mg | ORAL_TABLET | Freq: Every evening | ORAL | Status: DC | PRN
  Administered 2012-01-30: 10 mg via ORAL
  Filled 2012-01-30: qty 2

## 2012-01-30 MED FILL — Insulin Aspart Inj 100 Unit/ML: SUBCUTANEOUS | Qty: 0.15 | Status: AC

## 2012-01-30 NOTE — Progress Notes (Signed)
Patient Name: Courtney [name] Date of Encounter: 01/30/2012    SUBJECTIVE: No complaints. No palpitations. No chest pain.  TELEMETRY:  Sinus rhythm with occasional PVCs.Ceasar Mons Vitals:   01/29/12 1038 01/29/12 1400 01/29/12 2129 01/30/12 0500  BP: 134/74 109/58 106/55 101/48  Pulse: 73 61 66 63  Temp:  98 F (36.7 C) 97.9 F (36.6 C) 98.7 F (37.1 C)  TempSrc:  Oral Oral Oral  Resp:  20 18 18   Height:      Weight:    145.1 kg (319 lb 14.2 oz)  SpO2:  96% 97% 97%    Intake/Output Summary (Last 24 hours) at 01/30/12 0900 Last data filed at 01/29/12 1700  Gross per 24 hour  Intake    960 ml  Output      0 ml  Net    960 ml    LABS: Basic Metabolic Panel:  Basename 01/29/12 0500 01/28/12 1805  NA 136 134*  K 4.7 5.7*  CL 111 104  CO2 19 20  GLUCOSE 222* 329*  BUN 23 30*  CREATININE 0.81 0.97  CALCIUM 8.4 8.9  MG -- --  PHOS -- --   CBC:  Basename 01/28/12 1212 01/28/12 0710  WBC 13.8* 12.1*  NEUTROABS -- --  HGB 9.7* 9.7*  HCT 29.6* 30.0*  MCV 91.1 91.7  PLT 309 316    Basename 01/28/12 1212  HGBA1C 9.7*    Radiology/Studies:  No new data  Physical Exam: Blood pressure 101/48, pulse 63, temperature 98.7 F (37.1 C), temperature source Oral, resp. rate 18, height 5\' 9"  (1.753 m), weight 145.1 kg (319 lb 14.2 oz), last menstrual period 01/24/2012, SpO2 97.00%. Weight change:    Lungs clear.  Radial cath site is unremarkable  Cardiac exam reveals an S4 gallop  ASSESSMENT:  1. Coronary atherosclerotic heart disease, stable.  2. Nonsustained, symptomatic VT, without obvious recent runs.  3. Diabetes mellitus  4. Obesity  5. Probable sleep apnea   Plan:  1. Await completion of EP evaluation. Discharge pending declined recommendations.  2. We'll schedule outpatient sleep study.  Selinda Eon 01/30/2012, 9:00 AM

## 2012-01-30 NOTE — Progress Notes (Signed)
Gave pt education on Vtach.

## 2012-01-30 NOTE — Progress Notes (Signed)
Patient Name: Courtney [name]      SUBJECTIVE: quiet night  Tel with less VEA  Past Medical History  Diagnosis Date  . Diabetes mellitus     with retinopathy, neuropathy and microalbuminemia  . Obesity   . Dyslipidemia   . Hypertension   . Coronary artery disease     s/p BMS 2010 LAD  . Dysrhythmia     ventricular tachycardia resolved after LAD stent and beta blocker    PHYSICAL EXAM Filed Vitals:   01/29/12 1400 01/29/12 2129 01/30/12 0500 01/30/12 1345  BP: 109/58 106/55 101/48 155/79  Pulse: 61 66 63 65  Temp: 98 F (36.7 C) 97.9 F (36.6 C) 98.7 F (37.1 C) 98 F (36.7 C)  TempSrc: Oral Oral Oral Oral  Resp: 20 18 18 20   Height:      Weight:   319 lb 14.2 oz (145.1 kg)   SpO2: 96% 97% 97% 99%    Well developed and nourished in no acute distress HENT normal Neck supple with JVP-flat Clear Regular rate and rhythm, no murmurs or gallops Abd-soft with active BS No Clubbing cyanosis edema Skin-warm and dry A & Oriented  Grossly normal sensory and motor function   TELEMETRY: Reviewed telemetry pt in sinus w rare VEA   Intake/Output Summary (Last 24 hours) at 01/30/12 1442 Last data filed at 01/30/12 1300  Gross per 24 hour  Intake    840 ml  Output      0 ml  Net    840 ml    LABS: Basic Metabolic Panel:  Lab 01/29/12 4540 01/28/12 1805 01/28/12 1212 01/28/12 0710 01/27/12 1148 01/26/12 1020 01/25/12 1943 01/25/12 1713  NA 136 134* -- 134* 136 134* 136 137  K 4.7 5.7* -- 5.6* 4.7 4.9 4.6 5.1  CL 111 104 -- 106 103 102 101 102  CO2 19 20 -- 20 23 23 22 24   GLUCOSE 222* 329* -- 313* 152* 252* 127* 128*  BUN 23 30* -- 34* 41* 40* 36* 36*  CREATININE 0.81 0.97 1.01 0.99 1.40* 1.26* 1.59* --  CALCIUM 8.4 8.9 -- -- -- -- -- --  MG -- -- -- -- -- -- -- --  PHOS -- -- -- -- -- -- -- --   Cardiac Enzymes: No results found for this basename: CKTOTAL:3,CKMB:3,CKMBINDEX:3,TROPONINI:3 in the last 72 hours CBC:  Lab 01/28/12 1212 01/28/12 0710  01/27/12 1148 01/26/12 1020 01/25/12 1943 01/25/12 1713  WBC 13.8* 12.1* 8.8 7.1 10.2 9.5  NEUTROABS -- -- -- -- -- 5.1  HGB 9.7* 9.7* 10.2* 9.3* 9.6* 9.8*  HCT 29.6* 30.0* 31.3* 28.3* 29.3* 29.6*  MCV 91.1 91.7 91.8 91.3 91.3 91.4  PLT 309 316 309 267 296 280   Hemoglobin A1C:  Basename 01/28/12 1212  HGBA1C 9.7*   Fasting Lipid Panel: No results found for this basename: CHOL,HDL,LDLCALC,TRIG,CHOLHDL,LDLDIRECT in the last 72 hours Thyroid Function Tests: No results found for this basename: TSH,T4TOTAL,FREET3,T3FREE,THYROIDAB in the last 72 hours Anemia Panel:  Basename 01/29/12 1000 01/28/12 1212  VITAMINB12 -- 518  FOLATE -- --  FERRITIN 18 --  TIBC 259 --  IRON 50 --  RETICCTPCT -- --      ASSESSMENT AND PLAN:  Patient Active Hospital Problem List: Paroxysmal ventricular tachycardia-non sustained  (01/28/2012)   CAD (coronary artery disease) prior LAD BMS (01/28/2012)   Hyperkalemia  improved  * Chronic diastolic heart failure (01/28/2012)   Will discuss with Dr HS re mechanisms of VT   SACECG./MRI pending to  be completed today Aggressive anti ischemic Rx   uptitrataion of beta blockers and addition of nitrates are coincidental with improvement in VEA   If MRI/SAECG are abnormal, esp former, suggestive of infiltrative process then further considerationwill need be given as to inducibilitity of sustained vt and the role of an ICD  Signed, Sherryl Manges MD  01/30/2012

## 2012-01-31 ENCOUNTER — Encounter (HOSPITAL_COMMUNITY): Payer: Self-pay | Admitting: General Practice

## 2012-01-31 DIAGNOSIS — I471 Supraventricular tachycardia, unspecified: Secondary | ICD-10-CM

## 2012-01-31 LAB — GLUCOSE, CAPILLARY: Glucose-Capillary: 315 mg/dL — ABNORMAL HIGH (ref 70–99)

## 2012-01-31 MED ORDER — NITROGLYCERIN 0.4 MG SL SUBL: 0.4000 mg | SUBLINGUAL_TABLET | SUBLINGUAL | Status: AC | PRN

## 2012-01-31 MED ORDER — ISOSORBIDE MONONITRATE ER 60 MG PO TB24: 60.0000 mg | ORAL_TABLET | Freq: Every day | ORAL | Status: AC

## 2012-01-31 MED ORDER — METOPROLOL TARTRATE 100 MG PO TABS: 100.0000 mg | ORAL_TABLET | Freq: Two times a day (BID) | ORAL | Status: AC

## 2012-01-31 NOTE — Discharge Summary (Signed)
Patient ID: Courtney [name] MRN: [id number] DOB/AGE: 47-Nov-1967 47 y.o.  Admit date: 01/25/2012 Discharge date: 01/31/2012  Primary Discharge Diagnosis : Ventricular tachycardia Secondary Discharge Diagnosis: 1. 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  2. Very poorly controlled diabetes mellitus.  3. Severe iron deficiency anemia secondary to heavy menstrual bleeding  4. Probable sleep apnea  5. Hypertension  6. Renal insufficiency  7. Morbid obesity  8. Musculoskeletal inflammatory syndrome, poorly defined  Significant Diagnostic Studies: 1. Coronary angiography  2. MRI of the brain  3. MRI of the heart  Consults: Neurology, Dr. Roseanne Reno  Electrophysiology, Dr. Beverly Sessions Course: The patient underwent Holter monitoring after complaining of palpitations. She was noted to have asymptomatic 48 beat run of ventricular tachycardia at rates greater than 180 beats per minute. This finding led to admission for evaluation. She underwent coronary angiography to evaluate the patency of the previous LAD stent and to look for evidence of progression of disease. The catheterization demonstrated a widely patent stent and no high grade obstruction in the proximal coronaries. Diagonal and obtuse marginal branches were noted to have intermediate to moderately severe regions of obstruction with small distal supply of beds. The left ventricular function was normal. He electrophysiology was consult and (Dr. Graciela Husbands). A signal averaged EKG and cardiac MRI were ordered and were both normal. At this time it is felt that ventricular tachycardia could be related to small regions of ischemia. These regions are not amenable to PCI and intensification of medical therapy was recommended. Isosorbide mononitrate was started and beta blocker therapy double. Ectopy significantly decreased after these medication adjustments.  On admission the  patient also complained of having weakness on her left side. Neurology consultation was obtained and an MRI was performed. No abnormalities were found.  The patient's diabetes is very poorly controlled as denoted by a hemoglobin A1c of 9.5.  Historically, there is a strong clinical suspicion that she has sleep apnea. An outpatient study will be performed.  Because there were no high-grade coronary lesions and threatening proximal locations, Plavix was discontinued and aspirin decreased to 81 mg per day in hopes that this will decrease her menstrual bleeding which is contributing to her severe anemia     Discharge Exam: Blood pressure 116/53, pulse 56, temperature 98.3 F (36.8 C), temperature source Oral, resp. rate 18, height 5\' 9"  (1.753 m), weight 144.924 kg (319 lb 8 oz), last menstrual period 01/24/2012, SpO2 99.00%.     No abnormalities are noted on exam other than obesity. The catheterization site is unremarkable. Labs:   Lab Results  Component Value Date   WBC 13.8* 01/28/2012   HGB 9.7* 01/28/2012   HCT 29.6* 01/28/2012   MCV 91.1 01/28/2012   PLT 309 01/28/2012    Lab 01/29/12 0500 01/25/12 1713  NA 136 --  K 4.7 --  CL 111 --  CO2 19 --  BUN 23 --  CREATININE 0.81 --  CALCIUM 8.4 --  PROT -- 6.8  BILITOT -- 0.2*  ALKPHOS -- 57  ALT -- 9  AST -- 12  GLUCOSE 222* --   Lab Results  Component Value Date   CKTOTAL 109 01/26/2012   CKMB 1.7 01/26/2012   TROPONINI <0.30 01/26/2012      Radiology: No acute abnormality  EKG: Normal  FOLLOW UP PLANS AND APPOINTMENTS  Medication List  As of 01/31/2012  9:05 AM 46   STOP taking these medications  clopidogrel 75 MG tablet      diclofenac 75 MG EC tablet         TAKE these medications         aspirin 325 MG tablet   Take 325 mg by mouth daily.      AZOR 5-20 MG per tablet   Generic drug: amLODipine-olmesartan   Take 1 tablet by mouth daily.      ferrous sulfate 325 (65 FE) MG tablet   Take 325 mg by  mouth 2 (two) times daily with a meal.      fish oil-omega-3 fatty acids 1000 MG capsule   Take 2 g by mouth daily.      gabapentin 300 MG capsule   Commonly known as: NEURONTIN   Take 300 mg by mouth 2 (two) times daily.      insulin glargine 100 UNIT/ML injection   Commonly known as: LANTUS   Inject 50 Units into the skin at bedtime.      isosorbide mononitrate 60 MG 24 hr tablet   Commonly known as: IMDUR   Take 1 tablet (60 mg total) by mouth daily.      metFORMIN 1000 MG tablet   Commonly known as: GLUCOPHAGE   Take 1,000 mg by mouth 2 (two) times daily with a meal.      metoprolol 100 MG tablet   Commonly known as: LOPRESSOR   Take 1 tablet (100 mg total) by mouth 2 (two) times daily.      nitroGLYCERIN 0.4 MG SL tablet   Commonly known as: NITROSTAT   Place 1 tablet (0.4 mg total) under the tongue every 5 (five) minutes x 3 doses as needed for chest pain.      polyethylene glycol packet   Commonly known as: MIRALAX / GLYCOLAX   Take 17 g by mouth daily.      simvastatin 40 MG tablet   Commonly known as: ZOCOR   Take 40 mg by mouth every evening.           Follow-up Information    Follow up with Lesleigh Noe, MD on 02/12/2012. (9:30AM)    Contact information:   [address] [place] [address] [place] [address] [phone]          BRING ALL MEDICATIONS WITH YOU TO FOLLOW UP APPOINTMENTS  Time spent with patient to include physician time: 30 minutes Signed: Lesleigh Noe 01/31/2012, 9:05 AM

## 2012-01-31 NOTE — Progress Notes (Signed)
Patient Name: Courtney [name]      SUBJECTIVE: quiet night  Tel with less couplets.  occ bigeminy SAECG normal  MRI done, not yet read  Past Medical History  Diagnosis Date  . Diabetes mellitus     with retinopathy, neuropathy and microalbuminemia  . Obesity   . Dyslipidemia   . Hypertension   . Coronary artery disease     s/p BMS 2010 LAD  . Dysrhythmia     ventricular tachycardia resolved after LAD stent and beta blocker    PHYSICAL EXAM Filed Vitals:   01/30/12 1345 01/30/12 2057 01/30/12 2219 01/31/12 0624  BP: 155/79 154/90 133/58 116/53  Pulse: 65 78 61 56  Temp: 98 F (36.7 C) 98 F (36.7 C) 96.5 F (35.8 C) 98.3 F (36.8 C)  TempSrc: Oral Oral Oral Oral  Resp: 20 18 18 18   Height:      Weight:    319 lb 8 oz (144.924 kg)  SpO2: 99% 95% 99% 99%    Well developed and morbidly obese in no acute distress HENT normal Neck supple with JVP-flat Clear Regular rate and rhythm, no murmurs or gallops Abd-soft with active BS No Clubbing cyanosis edema Skin-warm and dry A & Oriented  Grossly normal sensory and motor function   TELEMETRY: Reviewed telemetry pt in sinus w rare VEA   Intake/Output Summary (Last 24 hours) at 01/31/12 0830 Last data filed at 01/30/12 2330  Gross per 24 hour  Intake   1080 ml  Output      0 ml  Net   1080 ml    LABS: Basic Metabolic Panel:  Lab 01/29/12 1610 01/28/12 1805 01/28/12 1212 01/28/12 0710 01/27/12 1148 01/26/12 1020 01/25/12 1943 01/25/12 1713  NA 136 134* -- 134* 136 134* 136 137  K 4.7 5.7* -- 5.6* 4.7 4.9 4.6 5.1  CL 111 104 -- 106 103 102 101 102  CO2 19 20 -- 20 23 23 22 24   GLUCOSE 222* 329* -- 313* 152* 252* 127* 128*  BUN 23 30* -- 34* 41* 40* 36* 36*  CREATININE 0.81 0.97 1.01 0.99 1.40* 1.26* 1.59* --  CALCIUM 8.4 8.9 -- -- -- -- -- --  MG -- -- -- -- -- -- -- --  PHOS -- -- -- -- -- -- -- --   Cardiac Enzymes: No results found for this basename: CKTOTAL:3,CKMB:3,CKMBINDEX:3,TROPONINI:3 in  the last 72 hours CBC:  Lab 01/28/12 1212 01/28/12 0710 01/27/12 1148 01/26/12 1020 01/25/12 1943 01/25/12 1713  WBC 13.8* 12.1* 8.8 7.1 10.2 9.5  NEUTROABS -- -- -- -- -- 5.1  HGB 9.7* 9.7* 10.2* 9.3* 9.6* 9.8*  HCT 29.6* 30.0* 31.3* 28.3* 29.3* 29.6*  MCV 91.1 91.7 91.8 91.3 91.3 91.4  PLT 309 316 309 267 296 280   Hemoglobin A1C:  Basename 01/28/12 1212  HGBA1C 9.7*   Fasting Lipid Panel: No results found for this basename: CHOL,HDL,LDLCALC,TRIG,CHOLHDL,LDLDIRECT in the last 72 hours Thyroid Function Tests: No results found for this basename: TSH,T4TOTAL,FREET3,T3FREE,THYROIDAB in the last 72 hours Anemia Panel:  Basename 01/29/12 1000 01/28/12 1212  VITAMINB12 -- 518  FOLATE -- --  FERRITIN 18 --  TIBC 259 --  IRON 50 --  RETICCTPCT -- --      ASSESSMENT AND PLAN:  Patient Active Hospital Problem List: Paroxysmal ventricular tachycardia-non sustained  (01/28/2012)   CAD (coronary artery disease) prior LAD BMS (01/28/2012)  * Chronic diastolic heart failure (01/28/2012)     SACECG normal /MRI pending to be read  today Continue Aggressive anti ischemic Rx   uptitrataion of beta blockers and addition of nitrates are coincidental with improvement in VEA   If MRI is abnormal, esp former, suggestive of infiltrative process then further consideration will need be given as to inducibilitity of sustained vt and the role of an ICD  Signed, Sherryl Manges MD  01/31/2012

## 2012-01-31 NOTE — Progress Notes (Signed)
Pt discharge instructions and patient education compete. IV site d/c. Site WNL. No s/s of distress. Pt had no further questions. D/C home via wheelchair Courtney [name], [name]

## 2012-01-31 NOTE — Progress Notes (Signed)
Inpatient Diabetes Program Recommendations  AACE/ADA: New Consensus Statement on Inpatient Glycemic Control (2009)  Target Ranges:  Prepandial:   less than 140 mg/dL      Peak postprandial:   less than 180 mg/dL (1-2 hours)      Critically ill patients:  140 - 180 mg/dL   Reason for Visit:  Results for Courtney [name], Courtney [name] (MRN [id number]) as of 01/31/2012 10:33  Ref. Range 01/30/2012 06:39 01/30/2012 11:27 01/30/2012 16:31 01/30/2012 22:01 01/31/2012 06:22  Glucose-Capillary Latest Range: 70-99 mg/dL 086 (H) 578 (H) 469 (H) 285 (H) 315 (H)    Inpatient Diabetes Program Recommendations Insulin - Basal: Increase Lantus to 58 units at bedtime. Insulin - Meal Coverage: Please add Novolog meal coverage 4 units tid with meals.  Note: Will follow.

## 2012-01-31 NOTE — Discharge Instructions (Signed)
Call Dr. Katrinka Blazing if chest pain or other problems.

## 2012-02-24 NOTE — H&P (Signed)
CARDIOLOGY     Office Visit        Patient: Soni Kegel, [place]  Provider: Michaell Cowing. Emelda Fear, NP   DOB: 10/04/1966 Age: 46 Y Sex: Female  Date: 01/18/2012   Phone: [phone]     Address: [address], [place], [phone]          Subjective:          CC:         1. CF/heart racing.               HPI:  General:  Mrs [name] is a 46 yo female with a hx of Diabetes, HTN, obesity,CAD with BMS 2010, and hx nonsustained V tac prior to LAD stent 2010 and metoprolol. Holter monitor 6/12 with occasional PVC no runs and in NSR. 10/12 nuclear stres test without ischemia. She comes in today after feeling her heart rate was up to 135 at 430 this am at home, awakened from sleep and she was lightheaded. She will suddenly feel dizziness like she might pass out if she does not sit down. This occurred twice the last few nights where she felt very dizzy. This occurred a few years ago when she was anemic and had transfusion. She is followed by GYN, Dr Arelia Sneddon due to vaginal bleeding but this has improved. She reports last Hgb check was low. She states she has not been taking her Fe tablets twice a day. She may have shortness of breath even at rest. A sleep study has been recommended but she has not obtained..  Home BP 160/100.              ROS:  as noted in HPI, she has multiple complaints, such as nausea at times unrelated to meals, and generally feels fatigued. no syncope, no fever, chills nor congestion.              Medical History: Type 2 Diabetes Mellitus, retinopathy, neuropathy, and microalbuminemia., Obesity, Hypercholesterolemia, Coronary artery disease, status post BM Stent, February 2010, Hypertension, Tachycardia, Symptomatic nonsustained V. tach, resolved with LAD stent and beta blocker. Norma lLVEF by cath, echo, and nuclear 12/2008.               Surgical History: Cholecystectomy 1990, Cardiac Stent February 2010, Colonoscopy 09/19/2011.              Hospitalization/Major Diagnostic Procedure: Pneumonia 2010, cholecystitis 1990, not in the past year 08/2011.               Family History: Father: alive Diabetes Mellitus,Hypertension, Bypass Surgery Mother: deceased Pancreatic cancer colon cancer Paternal Grand Father: deceased Diabetes Mellitus, Hypertension, Alzheimer's Paternal Grand Mother: deceased Diabetes Mellitus, Hypertension Maternal Grand Father: deceased Unknown Maternal Grand Mother: alive Colon Cancer Sister 1: alive colon cancer Sister 2: alive Well Sister 3: alive Well  Sisters 4, 5, 6, and 7 - Alive and Well.              Social History:  General: History of smoking cigarettes: Never smoked. no Smoking. Alcohol: yes, Social. no Caffeine. no Recreational drug use. Exercise: yes. Occupation: unemployed. Marital Status: married. Children: 2.               Medications: Ferrous Sulfate 325 (65 Fe) MG Tablet 1 tablet Twice a day, Simvastatin 40 MG Tablet 1 tablet every evening Once a day, Fish Oil 1000 MG Capsule 1 capsule with a meal Once a day, Miralax suspension 17 grams at hour of sleep, Metformin  HCl 1000 MG Tablet 1 tablet Twice a day, Gabapentin 300 MG Capsule 1 am/1 pm Daily, Voltaren 75 MG tablet Tablet 1 tablet BID, Lantus 100 UNIT/ML Solution 40 units qhs, Cinnamon 500 MG Tablet 1 tablet occ, Aspirin 325 MG Tablet Delayed Release 1 tablet every day, Plavix 75 MG Tablet 1 tablet once a day, Azor 5-20 MG Tablet 1 tablet Once a day, Metoprolol Tartrate 50 MG Tablet 1 tablet Twice a day, Medication List reviewed and reconciled with the patient              Allergies: Ampicillin: rash; can tolerate amoxicillin, contrast dye: hives.           Objective:          Vitals: Wt 300.8, Wt change -2.9 lb, Ht 69.5, BMI 43.78, Pulse sitting 76, BP sitting 100/60.              Examination:  Cardiology Exam:  GENERAL APPEARANCE: pleasant, NAD, obese, female. HEENT: normal. CAROTID UPSTROKE: no bruit, upstrokes intact. JVD:  flat. HEART: regular rate and rhythm, normal S1S2, no rub, no gallop, or click occasional individual skipped beats, reassessed 30 minutes after EKG, skipping decreased.. LUNGS: clear to auscultation, no wheezing/rhonchi/rales. ABDOMEN: soft, non-tender, + bowel sounds. EXTREMITIES: no leg edema. PERIPHERAL PULSES: 2+, bilateral.            Assessment:          Assessment:  1. Lightheadedness - 780.4 (Primary)  2. Coronary artery disease - 414.00, reassuring nuclear stress test 10/12  3. PVC - 427.69  4. Near syncope - 780.2      Plan:          1. Lightheadedness  LAB: Basic Metabolic     GLUCOSE  411  70-99 - mg/dL HH    BUN  23  6-04 - mg/dL     CREATININE  5.40  9.81-1.91 - mg/dl     eGFR (NON-AFRICAN AMERICAN)  48  >60 - calc L    eGFR (AFRICAN AMERICAN)  58  >60 - calc L    SODIUM  132  136-145 - mmol/L L    POTASSIUM  5.1  3.5-5.5 - mmol/L     CHLORIDE  100  98-107 - mmol/L     C02  25  22-32 - mg/dL     ANION GAP  47.8  2.9-56.2 - mmol/L     CALCIUM  9.7  8.6-10.3 - mg/dL       FERGUSON,CYNTHIA A 01/18/2012 03:44:45 PM > hyperglycemia, please ask pt has she been taking her insulin and metformin...please also send this to her PCP...this may explain alot of her symptoms we are awaiting cardiac monitor results [facility] 01/18/2012 04:55:32 PM > lmom informing pt and asked that she call back regarding ? of DM meds----to Dr Wynelle Link [facility] 01/18/2012 05:03:58 PM > Noted. She is supposed to be working with Dr. Sharl Ma, endocrinologist, for her diabetes. Will await her phone call to this office, but would forward info to Dr Sharl Ma as well. FERGUSON,CYNTHIA A 01/20/2012 01:13:26 PM > to Dr Sharl Ma [facility] 01/20/2012 05:39:21 PM > It appears that her most recent endocrinology visit occurred in May 2012. Tawanna Cooler, please encourge Ms. McMillian Monroe to schedule an office visit. Thank you. James,Sharon 01/22/2012 09:49:21 AM > Pt transferred to [facility] in [place] in  December.     LAB: CBC with Diff     WBC  6.6  4.0-11.0 - K/ul     RBC  3.80  4.20-5.40 - M/uL L    HGB  11.7  12.0-16.0 - g/dL L    HCT  40.9  81.1-91.4 - % L    MCH  30.6  27.0-33.0 - pg     MPV  8.4  7.5-10.7 - fL     MCV  92.8  81.0-99.0 - fL     MCHC  33.0  32.0-36.0 - g/dL     RDW  78.2  95.6-21.3 - %     PLT  258  150-400 - K/uL     NEUT %  50.5  43.3-71.9 - %     LYMPH%  41.0  16.8-43.5 - %     MONO %  7.8  4.6-12.4 - %     EOS %  0.7  0.0-7.8 - %     BASO %  0.0  0.0-1.0 - %     NEUT #  3.4  1.9-7.2 - K/uL     LYMPH#  2.70  1.10-2.70 - K/uL     MONO #  0.5  0.3-0.8 - K/uL     EOS #  0.0  0.0-0.6 - K/uL     BASO #  0.0  0.0-0.1 - K/uL       FERGUSON,CYNTHIA A 01/18/2012 03:19:29 PM > please tell pt her level of anemia is stable compared to her reported Hgb of 9.. cc to Dr Kingsley Plan 01/18/2012 04:53:14 PM > lmom informing pt Boehler,Eileen 01/21/2012 09:40:33 AM > copy of labs faxed to Dr Erlinda Hong Comb     Diagnostic Imaging:EKG NSR with Bigeminal PVCs vs possible PAC with aberrancies, Harward,Amy 01/18/2012 01:21:12 PM > FERGUSON,CYNTHIA A 01/18/2012 04:18:38 PM > also see scanned rhythmn strip..reviewed with Dr Anne Fu  I have asked pt to f/u with Dr Wynelle Link that some of her symptoms today such as nausea and sweatiness do not sound cardiac, I encouraged her to also f/u and have her sleep study that was offered to her from her weight loss examination and MD. she agrees to follow up with this.         2. Coronary artery disease Continue Simvastatin Tablet, 40 MG, 1 tablet every evening, Orally, Once a day ; Continue Fish Oil Capsule, 1000 MG, 1 capsule with a meal, Orally, Once a day ; Continue Aspirin Tablet Delayed Release, 325 MG, 1 tablet, Orally, every day ; Continue Plavix Tablet, 75 MG, 1 tablet, Orally, once a day ; Continue Azor Tablet, 5-20 MG, 1 tablet, Orally, Once a day .         3. PVC Continue Metoprolol Tartrate Tablet, 50 MG, 1 tablet, Orally, Twice a day .    LAB: TSH     TSH  1.04  0.34-5.60 - ulU/mL       FERGUSON,CYNTHIA A 01/18/2012 03:43:47 PM > stable Boehler,Eileen 01/18/2012 04:55:01 PM > lmom informing pt     with anemia stable we will obtain a cardiac monitor after EKG and rhythmn strip indicate bigeminal PVCs to R/O dangerous arrhythmis and have pt also f/u with Dr Katrinka Blazing.         4. Near syncope  Diagnostic Imaging:EC Holter 48hr (Ordered for 01/18/2012)         5. Others Continue Metformin HCl Tablet, 1000 MG, 1 tablet, Orally, Twice a day ; Continue Lantus Solution, 100 UNIT/ML, 40 units, Subcutaneous, qhs ; Continue Ferrous Sulfate Tablet, 325 (65 Fe) MG, 1 tablet, Orally, Twice a day .  Immunizations:               Labs:                 Procedure Codes: 47829 EKG I AND R, 80048 ECL BMP, 85025 ECL CBC PLATELET DIFF, 84443 ECL TSH, 56213 BLOOD COLLECTION ROUTINE VENIPUNCTURE              Preventive:                Follow Up: HS 4 weeks (Reason: PVCs, lightheadness, )            Provider: Michaell Cowing. Emelda Fear, NP   Patient: Iva Montelongo, [place] M DOB: Apr 06, 1966 Date: 01/18/2012     01/25/2012 - Addendum to office note of 01/18/2012  Patient was brought in to office today 01/25/2012 for discussion of results of Holter monitor done.  The patient was found on Holter Monitor to have nonsustained ventricular tachycardia up to 46 beats. She was brought into the office toda, 01/25/2012,  to review the findings of the Holter.  She had severe palpitations on Saturday 3/2 with presyncope which prompted an ER visit but she left.  This corresponded to the time on the Holter when she had the 46beat run of [facility].  Currently she is asymptomatic.   She is now admitted for further workup. She says she has been having intermittent palpitations and dizziness for several months along with the chest pressure but never gets chest pressure without the palpitations.  VS on exam today showed BP 124/5mmHg and pulse 84bpm.  Exam  benign.     IMPRESSION:   1.  Ventricular tachycardia up to 46 beats.  She has a history of VT in the presence of LAD stenosis in 2010 which resolved after stent placement and beta blocker therapy.  Given reoccurence of VT and known history of CAD/DM, ischemic origin needs to be ruled out.  She has been having some chest pressure in association with the palpitations. 2.  CAD s/p BMS to LAD 2010 3.  DM with retinopathy, neuropathy and microalbuminemia 4.  HTN 5.  dyslipidmiea 6.  Obesity   PLAN:   1.  Admit to step down unit 2.  Cycle cardiac enzymes 3.  Check TSH 4.  Cardiac cath on Monday 3/11 by Dr. Katrinka Blazing 5.  Increase metoprolol to 75mg  BID     Revision History.Marland KitchenMarland Kitchen

## 2013-07-16 ENCOUNTER — Inpatient Hospital Stay: Admission: AD | Admit: 2013-07-16 | Payer: Self-pay | Source: Ambulatory Visit | Admitting: Internal Medicine

## 2013-12-16 ENCOUNTER — Telehealth: Payer: Self-pay | Admitting: Interventional Cardiology

## 2013-12-16 NOTE — Telephone Encounter (Signed)
New Problem:  Denita is wanting to know if the patient is currently on Dialysis. Denita would liek a call back.

## 2013-12-16 NOTE — Telephone Encounter (Signed)
Left Denita a message to call back.

## 2014-07-19 ENCOUNTER — Ambulatory Visit: Payer: Self-pay | Admitting: Vascular Surgery

## 2014-08-05 ENCOUNTER — Ambulatory Visit: Payer: 59 | Admitting: Cardiovascular Disease

## 2014-10-28 ENCOUNTER — Encounter (HOSPITAL_COMMUNITY): Payer: Self-pay | Admitting: Interventional Cardiology

## 2015-03-12 NOTE — Op Note (Signed)
PATIENT NAME:  Gaetano HawthorneMCMILLIAN MONROE, Marcille MR#:  [id number] DATE OF BIRTH:  1965-12-29  DATE OF PROCEDURE:  07/19/2014  PREOPERATIVE DIAGNOSES: 1.  End-stage renal disease.  2.  Chronic respiratory failure with chronic tracheostomy.  3.  Sacral decubitus.   POSTOPERATIVE DIAGNOSES:   1.  End-stage renal disease.  2.  Chronic respiratory failure with chronic tracheostomy.  3.  Sacral decubitus.   PROCEDURES:  1.  Fluoroscopic guidance for placement of catheter.  2.  Placement of a 44 cm tip to cuff tunnelled left femoral palindrome-type dialysis catheter.   SURGEON:  Annice NeedyJason S Pamelyn Bancroft, MD   ANESTHESIA: Local with moderate conscious sedation.   ESTIMATED BLOOD LOSS: Minimal.   FLUOROSCOPY TIME: Approximately 1 minute.   CONTRAST USED: None.   INDICATION FOR PROCEDURE: A 49 year old female sent from [facility] due to need for a PermCath. She has had a temporary catheter in the groin. 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 She is chronic tracheostomy and needs a dialysis catheter for permanent access. For this reason, we will use her groin access with her tracheostomy and previous infections of the neck.   DESCRIPTION OF PROCEDURE: Patient is brought to the vascular suite; left groin and existing catheter were sterilely prepped and draped, and a sterile surgical field was created. The existing catheter was removed and rewired with an Amplatz Super Stiff wire; over this wire I  dilated up to the peel-away sheath. The peel-away sheath was then placed in the left femoral vein. Using fluoroscopic guidance, I selected a 44 cm tipped cuff tunneled hemodialysis catheter created. I created a counter incision in the anterior thigh; tunneled from the counterincision to the access site and then placed the catheter through the peel-away sheath, and the peel-away sheath was removed.   The catheter tip was parked in the retrohepatic vena cava at about the level of T11-T12. The  appropriate distal connectors were placed. It withdrew blood easily and flushed well with heparinized saline, and a concentrated heparin solution was placed. It was secured to the leg with 2 Prolene sutures; 4-0 Monocryl were used to close the access site, and a 4-0 Monocryl was used to place around the exit site.   A sterile dressing was placed. The patient tolerated the procedure well and was taken to the recovery room in stable condition.    ____________________________ Annice NeedyJason S. Tabbetha Kutscher, MD jsd:nt D: 07/19/2014 11:28:07 ET T: 07/19/2014 12:40:34 ET JOB#: [id number]  cc: Annice NeedyJason S. Leialoha Hanna, MD, <Dictator> Annice NeedyJASON S Warden Buffa MD ELECTRONICALLY SIGNED 07/21/2014 8:56

## 2015-06-15 ENCOUNTER — Other Ambulatory Visit (HOSPITAL_COMMUNITY): Payer: 59

## 2015-06-15 ENCOUNTER — Other Ambulatory Visit (HOSPITAL_COMMUNITY): Payer: Self-pay

## 2015-06-15 ENCOUNTER — Inpatient Hospital Stay
Admission: AD | Admit: 2015-06-15 | Discharge: 2015-08-03 | Disposition: A | Discharge status: Skilled Nursing Facility | Payer: 59 | Source: Ambulatory Visit | Attending: Internal Medicine | Admitting: Internal Medicine

## 2015-06-15 DIAGNOSIS — J9620 Acute and chronic respiratory failure, unspecified whether with hypoxia or hypercapnia: Secondary | ICD-10-CM

## 2015-06-15 DIAGNOSIS — Z992 Dependence on renal dialysis: Secondary | ICD-10-CM

## 2015-06-15 DIAGNOSIS — Z930 Tracheostomy status: Secondary | ICD-10-CM

## 2015-06-15 DIAGNOSIS — K7460 Unspecified cirrhosis of liver: Secondary | ICD-10-CM

## 2015-06-15 DIAGNOSIS — N186 End stage renal disease: Secondary | ICD-10-CM

## 2015-06-15 DIAGNOSIS — Z4659 Encounter for fitting and adjustment of other gastrointestinal appliance and device: Secondary | ICD-10-CM

## 2015-06-15 DIAGNOSIS — J189 Pneumonia, unspecified organism: Secondary | ICD-10-CM

## 2015-06-15 DIAGNOSIS — J9690 Respiratory failure, unspecified, unspecified whether with hypoxia or hypercapnia: Secondary | ICD-10-CM

## 2015-06-15 NOTE — Consult Note (Signed)
[facility] CONSULT NOTE    Date: 06/15/2015                  Patient Name:  Courtney [name]  MRN: [id number]  DOB: 04-Aug-1966  Age / Sex: 49 y.o., female         PCP: [name], MD                 Service Requesting Consult: Dr. Sharyon Medicus                 Reason for Consult: Evaluation and management of ESRD            History of Present Illness: Patient is a 49 y.o. 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  Much of the information was obtained from discussion with the patient's husband is a full detailed discharge summary was not available to [place] at the time of evaluation. The patient has apparently been at [facility] since January. She's been troubled with enteric cutaneous fistula which is resulted in difficulty. In addition the patient has sacral decubitus ulcer which was stage IV. In regards to her end-stage renal disease the patient started on dialysis in October of 2014 and she's been on it since then. She has a right internal jugular PermCath in place. She was previously on a Tuesday, Thursday, Saturday dialysis schedule.   Medications: Outpatient medications: Prescriptions prior to admission  Medication Sig Dispense Refill Last Dose  . amLODipine-olmesartan (AZOR) 5-20 MG per tablet Take 1 tablet by mouth daily.   01/25/2012 at Unknown  . aspirin 325 MG tablet Take 325 mg by mouth daily.    01/25/2012 at Unknown  . ferrous sulfate 325 (65 FE) MG tablet Take 325 mg by mouth 2 (two) times daily with a meal.   01/25/2012 at Unknown  . fish oil-omega-3 fatty acids 1000 MG capsule Take 2 g by mouth daily.   01/24/2012 at Unknown  . gabapentin (NEURONTIN) 300 MG capsule Take 300 mg by mouth 2 (two) times daily.   01/25/2012 at Unknown  . insulin glargine (LANTUS) 100 UNIT/ML injection Inject 50 Units into the skin at bedtime.    01/24/2012 at Unknown  . metFORMIN (GLUCOPHAGE) 1000 MG tablet Take 1,000 mg by mouth 2 (two) times daily with a meal.   01/25/2012 at Unknown  . polyethylene glycol (MIRALAX / GLYCOLAX) packet Take 17 g by mouth daily.   Past Week at Unknown  . simvastatin (ZOCOR) 40 MG tablet Take 40 mg by mouth every evening.   01/25/2012 at Unknown    Current medications:  discharge medications from [facility] reviewed   Allergies: Allergies  Allergen Reactions  . Contrast Media [Iodinated Diagnostic Agents] Anaphylaxis  . Ampicillin Rash      Past Medical History: Past Medical History  Diagnosis Date  . Obesity   . Dyslipidemia   . Hypertension   . Coronary artery disease     s/p BMS 2010 LAD  . Dysrhythmia  ventricular tachycardia resolved after LAD stent and beta blocker  . Diabetes mellitus     with retinopathy, neuropathy and microalbuminemia  ESRD, enterocutaneous fistula, hx of UE DVT, stage IV sacral decubitus ulcer, malnutrition, debility, anemia of CKD, SHPTH   Past Surgical History: Past Surgical History  Procedure Laterality Date  . Cholecystectomy  1990  . Pci lad  12/2010  . Colonoscopy  08/2011  . Left heart catheterization with coronary angiogram N/A 01/28/2012    Procedure: LEFT HEART CATHETERIZATION WITH CORONARY ANGIOGRAM;  Surgeon: Lesleigh Noe, MD;  Location: [facility] CATH LAB;  Service: Cardiovascular;  Laterality: N/A;     Family History: Family History  Problem Relation Age of Onset  . Hypertension Father   .  Diabetes Father   . Cancer Mother   . Diabetes Paternal Grandfather   . Hypertension Paternal Grandfather   . Diabetes Paternal Grandmother   . Hypertension Paternal Grandmother   . Cancer Maternal Grandmother     colon ca     Social History: History   Social History  . Marital Status: Married    Spouse Name: N/A  . Number of Children: N/A  . Years of Education: N/A   Occupational History  . Not on file.   Social History Main Topics  . Smoking status: Never Smoker   . Smokeless tobacco: Never Used  . Alcohol Use: Yes     Comment: occassional  . Drug Use: No  . Sexual Activity: Yes   Other Topics Concern  . Not on file   Social History Narrative     Review of Systems: Pt unable to provide ROS  Vital Signs: Temperature 98.8 pulse 71 respirations 20 blood pressure 152/86 Weight trends: There were no vitals filed for this visit.  Physical Exam: General: NAD, obese  Head: Normocephalic, atraumatic.  Eyes: Anicteric, EOMI  Nose: NG in place  Throat: Oropharynx nonerythematous, no exudate appreciated.   Neck: Tracheostomy in place.  Lungs:  Normal respiratory effort. Clear to auscultation BL without crackles or wheezes.  Heart: RRR. S1 and S2 normal without gallop, murmur, or rubs.  Abdomen:  BS normoactive. Soft, Nondistended, non-tender.  No masses or organomegaly.  Extremities: Trace b/l LE edema noted.  Neurologic: Awake, followed commands with all 4 extremeties  Skin: No visible rashes, scars.  Access: R IJ permcath.  Lab results: Basic Metabolic Panel: No results for input(s): NA, K, CL, CO2, GLUCOSE, BUN, CREATININE, CALCIUM, MG, PHOS in the last 168 hours.  Liver Function Tests: No results for input(s): AST, ALT, ALKPHOS, BILITOT, PROT, ALBUMIN in the last 168 hours. No results for input(s): LIPASE, AMYLASE in the last 168 hours. No results for input(s): AMMONIA in the last 168 hours.  CBC: No results for input(s): WBC, NEUTROABS, HGB, HCT, MCV,  PLT in the last 168 hours.  Cardiac Enzymes: No results for input(s): CKTOTAL, CKMB, CKMBINDEX, TROPONINI in the last 168 hours.  BNP: Invalid input(s): POCBNP  CBG: No results for input(s): GLUCAP in the last 168 hours.  Microbiology: Results for orders placed or performed during the hospital encounter of 01/25/12  MRSA PCR Screening     Status: None   Collection Time: 01/25/12  4:31 PM  Result Value Ref Range Status   MRSA by PCR NEGATIVE NEGATIVE Final    Comment:        The GeneXpert MRSA Assay (FDA approved for NASAL specimens only), is one component of a comprehensive MRSA colonization surveillance program. It is not intended to  diagnose MRSA infection nor to guide or monitor treatment for MRSA infections.    Coagulation Studies: No results for input(s): LABPROT, INR in the last 72 hours.  Urinalysis: No results for input(s): COLORURINE, LABSPEC, PHURINE, GLUCOSEU, HGBUR, BILIRUBINUR, KETONESUR, PROTEINUR, UROBILINOGEN, NITRITE, LEUKOCYTESUR in the last 72 hours.  Invalid input(s): APPERANCEUR    Imaging: Dg Chest Port 1 View  06/15/2015   CLINICAL DATA:  Nasogastric tube placement  EXAM: PORTABLE CHEST - 1 VIEW  COMPARISON:  01/27/2012  FINDINGS: Nasogastric tube coursing below the diaphragm.  Tracheostomy tube in satisfactory position. Right jugular central venous catheter in satisfactory position. Dual-lumen right jugular central venous catheter in satisfactory position. Stable cardiomegaly. Mild bilateral interstitial prominence. No pleural effusion, pneumothorax or focal consolidation. No acute osseous abnormality. Severe osteopenia.  IMPRESSION: Nasogastric tube coursing below the diaphragm with the tip excluded from the field of view.   Electronically Signed   By: Elige Ko   On: 06/15/2015 16:19   Dg Abd Portable 1v  06/15/2015   CLINICAL DATA:  Assess position of the nasogastric tube  EXAM: PORTABLE ABDOMEN - 1 VIEW  COMPARISON:  None available in PACs   FINDINGS: The esophagogastric tube tip projects in the region of the pylorus of the stomach. The proximal port is in the gastric body. There is mild gaseous distention of the stomach. There is lucency over the left mid abdomen that extends outside of the confines of the left lateral abdominal wall.  IMPRESSION: 1. The esophagogastric tube appears to be in appropriate position with the tip in the region of the pylorus. 2. There is lucency in the left mid abdomen that appears to extend outside the confines of the abdominal wall but evaluation is limited due to the field of view. A three-way abdominal series is recommended. 3. These results will be called to the ordering clinician or representative by the Radiologist Assistant, and communication documented in the PACS or zVision Dashboard.   Electronically Signed   By: David  [place] M.D.   On: 06/15/2015 16:18      Assessment & Plan:  50 y.o. 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  1. End-stage renal disease on hemodialysis. The patient has been on also since October of 2014 per her husband. We will maintain the patient on dialysis schedule Monday, Wednesday, Friday while here at our facility. Her last dialysis was on Tuesday. Therefore we will plan for an extra treatment tomorrow and then get her back on schedule on Friday. We will use her right internal jugular PermCath for  now.  2. Anemia chronic kidney disease. We will check a CBC today and determine if she needs Aranesp.  3. Secondary hyperparathyroidism. We will fully evaluate her bone mineral metabolism parameters.  4. Chronic respiratory failure. The patient has tracheostomy in place.  5. Enterocutaneous fistula. This has been one of the big issues the patient has dealt with while at [facility]. She currently has an NG tube in place. Further evaluation and management per hospitalist.

## 2015-06-16 DIAGNOSIS — Z930 Tracheostomy status: Secondary | ICD-10-CM

## 2015-06-16 DIAGNOSIS — J9601 Acute respiratory failure with hypoxia: Secondary | ICD-10-CM

## 2015-06-16 DIAGNOSIS — A047 Enterocolitis due to Clostridium difficile: Secondary | ICD-10-CM | POA: Diagnosis not present

## 2015-06-16 DIAGNOSIS — R0902 Hypoxemia: Secondary | ICD-10-CM | POA: Diagnosis not present

## 2015-06-16 LAB — BLOOD GAS, ARTERIAL
ACID-BASE DEFICIT: 1.2 mmol/L (ref 0.0–2.0)
ACID-BASE DEFICIT: 2.1 mmol/L — AB (ref 0.0–2.0)
BICARBONATE: 24.4 meq/L — AB (ref 20.0–24.0)
Bicarbonate: 21.4 mEq/L (ref 20.0–24.0)
FIO2: 0.28
FIO2: 0.28
O2 Saturation: 98.5 %
O2 Saturation: 98.8 %
PATIENT TEMPERATURE: 98
PATIENT TEMPERATURE: 98.6
PEEP: 5 cmH2O
RATE: 16 resp/min
TCO2: 25.9 mmol/L (ref 0–100)
TCO2: 22.4 mmol/L (ref 0–100)
VT: 500 mL
pCO2 arterial: 50.4 mmHg — ABNORMAL HIGH (ref 35.0–45.0)
pCO2 arterial: 32.2 mmHg — ABNORMAL LOW (ref 35.0–45.0)
pH, Arterial: 7.304 — ABNORMAL LOW (ref 7.350–7.450)
pH, Arterial: 7.438 (ref 7.350–7.450)
pO2, Arterial: 118 mmHg — ABNORMAL HIGH (ref 80.0–100.0)
pO2, Arterial: 117 mmHg — ABNORMAL HIGH (ref 80.0–100.0)

## 2015-06-16 LAB — CBC WITH DIFFERENTIAL/PLATELET
Basophils Absolute: 0 10*3/uL (ref 0.0–0.1)
Basophils Relative: 0 % (ref 0–1)
Eosinophils Absolute: 0 10*3/uL (ref 0.0–0.7)
Eosinophils Relative: 0 % (ref 0–5)
HCT: 30 % — ABNORMAL LOW (ref 36.0–46.0)
Hemoglobin: 9.6 g/dL — ABNORMAL LOW (ref 12.0–15.0)
LYMPHS ABS: 1.6 10*3/uL (ref 0.7–4.0)
Lymphocytes Relative: 21 % (ref 12–46)
MCH: 32.2 pg (ref 26.0–34.0)
MCHC: 32 g/dL (ref 30.0–36.0)
MCV: 100.7 fL — ABNORMAL HIGH (ref 78.0–100.0)
Monocytes Absolute: 1.1 10*3/uL — ABNORMAL HIGH (ref 0.1–1.0)
Monocytes Relative: 15 % — ABNORMAL HIGH (ref 3–12)
Neutro Abs: 4.9 10*3/uL (ref 1.7–7.7)
Neutrophils Relative %: 64 % (ref 43–77)
Platelets: 200 10*3/uL (ref 150–400)
RBC: 2.98 MIL/uL — AB (ref 3.87–5.11)
RDW: 16.3 % — ABNORMAL HIGH (ref 11.5–15.5)
WBC: 7.6 10*3/uL (ref 4.0–10.5)

## 2015-06-16 LAB — COMPREHENSIVE METABOLIC PANEL
ALBUMIN: 1.2 g/dL — AB (ref 3.5–5.0)
ALT: 14 U/L (ref 14–54)
AST: 12 U/L — ABNORMAL LOW (ref 15–41)
Alkaline Phosphatase: 278 U/L — ABNORMAL HIGH (ref 38–126)
Anion gap: 4 — ABNORMAL LOW (ref 5–15)
BUN: 29 mg/dL — AB (ref 6–20)
CO2: 26 mmol/L (ref 22–32)
Calcium: 8.5 mg/dL — ABNORMAL LOW (ref 8.9–10.3)
Chloride: 102 mmol/L (ref 101–111)
Creatinine, Ser: 1.44 mg/dL — ABNORMAL HIGH (ref 0.44–1.00)
GFR calc Af Amer: 48 mL/min — ABNORMAL LOW (ref >60)
GFR calc non Af Amer: 42 mL/min — ABNORMAL LOW (ref >60)
GLUCOSE: 89 mg/dL (ref 65–99)
Potassium: 4.8 mmol/L (ref 3.5–5.1)
Sodium: 132 mmol/L — ABNORMAL LOW (ref 135–145)
Total Bilirubin: 0.5 mg/dL (ref 0.3–1.2)
Total Protein: 4.7 g/dL — ABNORMAL LOW (ref 6.5–8.1)

## 2015-06-16 LAB — RENAL FUNCTION PANEL
ANION GAP: 4 — AB (ref 5–15)
Albumin: 1.2 g/dL — ABNORMAL LOW (ref 3.5–5.0)
BUN: 28 mg/dL — ABNORMAL HIGH (ref 6–20)
CO2: 26 mmol/L (ref 22–32)
Calcium: 8.4 mg/dL — ABNORMAL LOW (ref 8.9–10.3)
Chloride: 101 mmol/L (ref 101–111)
Creatinine, Ser: 1.45 mg/dL — ABNORMAL HIGH (ref 0.44–1.00)
GFR, EST AFRICAN AMERICAN: 48 mL/min — AB (ref >60)
GFR, EST NON AFRICAN AMERICAN: 41 mL/min — AB (ref >60)
Glucose, Bld: 88 mg/dL (ref 65–99)
PHOSPHORUS: 3.7 mg/dL (ref 2.5–4.6)
Potassium: 4.8 mmol/L (ref 3.5–5.1)
Sodium: 131 mmol/L — ABNORMAL LOW (ref 135–145)

## 2015-06-16 LAB — HEPATITIS B CORE ANTIBODY, TOTAL: HEP B C TOTAL AB: NEGATIVE

## 2015-06-16 LAB — HEPATITIS B SURFACE ANTIBODY,QUALITATIVE: Hep B S Ab: NONREACTIVE

## 2015-06-16 LAB — SEDIMENTATION RATE: Sed Rate: 24 mm/hr — ABNORMAL HIGH (ref 0–22)

## 2015-06-16 LAB — C-REACTIVE PROTEIN: CRP: 4.9 mg/dL — ABNORMAL HIGH (ref <1.0)

## 2015-06-16 LAB — PHOSPHORUS: Phosphorus: 3.7 mg/dL (ref 2.5–4.6)

## 2015-06-16 LAB — MAGNESIUM: Magnesium: 1.9 mg/dL (ref 1.7–2.4)

## 2015-06-16 LAB — PROCALCITONIN: PROCALCITONIN: 0.31 ng/mL

## 2015-06-16 NOTE — Consult Note (Deleted)
Error   06/16/2015, 11:17 AM

## 2015-06-16 NOTE — Consult Note (Signed)
Name: Courtney [name] MRN: [id number] DOB: Oct 03, 1966    ADMISSION DATE:  06/15/2015 CONSULTATION DATE:  7/28   REFERRING MD :  Sharyon Medicus   CHIEF COMPLAINT:  Vent weaning/trach care  BRIEF PATIENT DESCRIPTION:   49 y.o. female with a very complex PMHx of morbid obesity, DM, OSA, osteo of spine, ESRD who was s/p status post gastric bypass surgery. Admitted to [facility] jan 16 w/ sepsis in setting of sacral stage IV decub ulcer w/ resultant MRSA osteo and bacteremia. 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 At time of admission she was on night time ventilation, getting wound care wet to dry dressings on sacrum, and progressing w/ PT. PCCM was asked to assist w/ weaning efforts.   SIGNIFICANT EVENTS    STUDIES:     HISTORY OF PRESENT ILLNESS:   See above  PAST MEDICAL HISTORY :   has a past medical history of Obesity; Dyslipidemia; Hypertension; Coronary artery disease; Dysrhythmia; and Diabetes mellitus.  has past surgical history that includes Cholecystectomy (1990); PCI LAD (12/2010); Colonoscopy (08/2011); and left heart catheterization with coronary angiogram (N/A, 01/28/2012). Prior to Admission medications   Medication Sig Start Date End Date Taking? Authorizing Provider  amLODipine-olmesartan (AZOR) 5-20 MG per tablet Take 1 tablet by mouth daily.    Historical Provider, MD  aspirin 325 MG tablet Take 325 mg by mouth daily.    Historical Provider, MD  ferrous sulfate 325 (65 FE) MG tablet Take 325 mg by mouth 2 (two) times daily with a meal.    Historical Provider, MD  fish oil-omega-3 fatty acids 1000 MG capsule Take 2 g by mouth daily.    Historical Provider, MD  gabapentin (NEURONTIN) 300 MG  capsule Take 300 mg by mouth 2 (two) times daily.    Historical Provider, MD  insulin glargine (LANTUS) 100 UNIT/ML injection Inject 50 Units into the skin at bedtime.     Historical Provider, MD  metFORMIN (GLUCOPHAGE) 1000 MG tablet Take 1,000 mg by mouth 2 (two) times daily with a meal.    Historical Provider, MD  polyethylene glycol (MIRALAX / GLYCOLAX) packet Take 17 g by mouth daily.    Historical Provider, MD  simvastatin (ZOCOR) 40 MG tablet Take 40 mg by mouth every evening.    Historical Provider, MD   Current meds: oral vanc, simethicone, melatonine, lisinopril, solucortef, [place] heparin, coreg   Allergies  Allergen Reactions  . Contrast Media [Iodinated Diagnostic Agents] Anaphylaxis  . Ampicillin Rash    FAMILY HISTORY:  family history includes Cancer in her maternal grandmother and mother; Diabetes in her father, paternal grandfather, and paternal grandmother; Hypertension in her father, paternal grandfather, and paternal grandmother. SOCIAL HISTORY:  reports that she has never smoked. She has never used smokeless tobacco. She reports that she drinks alcohol. She reports that she does not use illicit drugs.  REVIEW OF SYSTEMS:   Unable   VITAL SIGNS:  97.6, 62, 18 122/64 sats 100%  PHYSICAL EXAMINATION: General:  Chronically ill appearing 49 year old female. Unresponsive.  Neuro: opens eyes to noxious stim only  HEENT:  Trach midline, facial adiposity  Cardiovascular:  rrr Lungs:  Decreased t/o  Abdomen:  Obese, mid abd incision CD Musculoskeletal:  Flaccid  Skin:  Sacral wound not assessed.  CBC Recent Labs     06/16/15  0530  WBC  7.6  HGB  9.6*  HCT  30.0*  PLT  200    Coag's No results for input(s): APTT, INR in the last 72 hours.  BMET Recent Labs     06/16/15  0530  NA  131*  132*  K  4.8  4.8  CL  101  102  CO2  26  26  BUN  28*  29*  CREATININE  1.45*  1.44*  GLUCOSE  88  89    Electrolytes Recent Labs     06/16/15  0530   CALCIUM  8.4*  8.5*  MG  1.9  PHOS  3.7  3.7    Sepsis Markers Recent Labs     06/16/15  0530  PROCALCITON  0.31    ABG No results for input(s): PHART, PCO2ART, PO2ART in the last 72 hours.  Liver Enzymes Recent Labs     06/16/15  0530  AST  12*  ALT  14  ALKPHOS  278*  BILITOT  0.5  ALBUMIN  1.2*  1.2*    Cardiac Enzymes No results for input(s): TROPONINI, PROBNP in the last 72 hours.  Glucose No results for input(s): GLUCAP in the last 72 hours.  Imaging Dg Chest Port 1 View  06/15/2015   CLINICAL DATA:  Nasogastric tube placement  EXAM: PORTABLE CHEST - 1 VIEW  COMPARISON:  01/27/2012  FINDINGS: Nasogastric tube coursing below the diaphragm.  Tracheostomy tube in satisfactory position. Right jugular central venous catheter in satisfactory position. Dual-lumen right jugular central venous catheter in satisfactory position. Stable cardiomegaly. Mild bilateral interstitial prominence. No pleural effusion, pneumothorax or focal consolidation. No acute osseous abnormality. Severe osteopenia.  IMPRESSION: Nasogastric tube coursing below the diaphragm with the tip excluded from the field of view.   Electronically Signed   By: Elige Ko   On: 06/15/2015 16:19   Dg Abd Portable 1v  06/15/2015   CLINICAL DATA:  Assess position of the nasogastric tube  EXAM: PORTABLE ABDOMEN - 1 VIEW  COMPARISON:  None available in PACs  FINDINGS: The esophagogastric tube tip projects in the region of the pylorus of the stomach. The proximal port is in the gastric body. There is mild gaseous distention of the stomach. There is lucency over the left mid abdomen that extends outside of the confines of the left lateral abdominal wall.  IMPRESSION: 1. The esophagogastric tube appears to be in appropriate position with the tip in the region of the pylorus. 2. There is lucency in the left mid abdomen that appears to extend outside the confines of the abdominal wall but evaluation is limited due to the  field of view. A three-way abdominal series is recommended. 3. These results will be called to the ordering clinician or representative by the Radiologist Assistant, and communication documented in the PACS or zVision Dashboard.   Electronically Signed   By: David  [place] M.D.   On: 06/15/2015 16:18       ASSESSMENT / PLAN:  Stage IV sacral wound w/ h/o treated osteo and bacteremia  Tracheostomy status OSA Acute on Chronic hypercarbic respiratory failure w/ ventilator dependence Severe deconditioning Severe PCM C diff colitis (active) DVT Acute encephalopathy  H/o adrenal insuff DM Anemia of critical illness  Discussion  This is a 49 year old female s/p prolonged critical illness initially a consequence of gastric bypass w/ gastric sleeve c/b several abd abscess, abd fistula and recurrent infection (mrsa  bacteremia, mrsa osteo, pseudomonas and CRE UTI). Re-admitted to [facility] Jan 2016 due to stage IV sacral wound/osteo and bacteremia. Had made progress over the last 7 months to the point that she was actually tolerating GI diet, nocturnal ventilation, ATC during day and progressing slowly from a PT stand-point. PCCM was asked to see for trach/vent assist. On our initial assessment she is minimally responsive to noxious stimuli. Have discussed current findings and concerns w/ primary attending MD  Plan Place on full vent support for today Daily wean Mandatory rest on full vent support  Poor candidate for decannulation in near future  Stop all sedating meds  Consider pan culture given extensive ID history Cont nutritional support Cont oral vanc via NGT Cont PT efforts as MS will allow Cont wound care per WON   Simonne Martinet ACNP-BC [facility] Pulmonary/Critical Care Pager # [phone] OR # [phone] if no answer  Attending Note:  49 year old female who had a gastric bypass then had a lot of complications including fistula, osteo, sacral decub and UTI.  Trached in [facility] and sent to [facility]  for rehab.  Patient is unresponsive on exam on vent.  PCCM asked to assist with vent management.  I reviewed CXR myself, cardiomegaly and pulmonary edema.  Discussed with PCCM-NP and SSH-MD.  Acute on chronic respiratory failure with prolonged critical illness.  - Full vent support today since unresponsive.  - Weaning per protocol when more stable.  - Mandatory rest on full vent support.  AMS: due to sedating medications.  - Minimize sedation as able.  - Monitor mental status, if continues to be poor will consider a head CT, none now since exam is non-focal.  Hypoxemia: likely due to pulmonary edema.  - Titrate O2 for sat of 88-92.  - Full vent support.  C. Diff:   - Continue PO vanc.  - Monitor til clearance.  - Pan culture given extensive history of nosocomial infection.  Wound infections:  - Continue wound care.  - Wound care nurse involved.  Tracheostomy care:  - Keep a cuffed trach for now.  - When mental status allows then consider PMV during the day.  - Will need a swallow evaluation.  Patient seen and examined, agree with above note.  I dictated the care and orders written for this patient under my direction.  Alyson Reedy, MD [phone]  06/16/2015, 11:17 AM

## 2015-06-17 LAB — PTH, INTACT AND CALCIUM
CALCIUM TOTAL (PTH): 8.1 mg/dL — AB (ref 8.7–10.2)
PTH: 32 pg/mL (ref 15–65)

## 2015-06-17 LAB — RENAL FUNCTION PANEL
Albumin: 1.3 g/dL — ABNORMAL LOW (ref 3.5–5.0)
Anion gap: 11 (ref 5–15)
BUN: 35 mg/dL — ABNORMAL HIGH (ref 6–20)
CALCIUM: 8.4 mg/dL — AB (ref 8.9–10.3)
CHLORIDE: 99 mmol/L — AB (ref 101–111)
CO2: 19 mmol/L — AB (ref 22–32)
CREATININE: 1.73 mg/dL — AB (ref 0.44–1.00)
GFR calc Af Amer: 39 mL/min — ABNORMAL LOW (ref >60)
GFR, EST NON AFRICAN AMERICAN: 33 mL/min — AB (ref >60)
GLUCOSE: 63 mg/dL — AB (ref 65–99)
Phosphorus: 3.4 mg/dL (ref 2.5–4.6)
Potassium: 5.6 mmol/L — ABNORMAL HIGH (ref 3.5–5.1)
Sodium: 129 mmol/L — ABNORMAL LOW (ref 135–145)

## 2015-06-17 LAB — CBC
HCT: 33.8 % — ABNORMAL LOW (ref 36.0–46.0)
Hemoglobin: 11 g/dL — ABNORMAL LOW (ref 12.0–15.0)
MCH: 32.1 pg (ref 26.0–34.0)
MCHC: 32.5 g/dL (ref 30.0–36.0)
MCV: 98.5 fL (ref 78.0–100.0)
PLATELETS: 228 10*3/uL (ref 150–400)
RBC: 3.43 MIL/uL — ABNORMAL LOW (ref 3.87–5.11)
RDW: 16.2 % — AB (ref 11.5–15.5)
WBC: 11 10*3/uL — ABNORMAL HIGH (ref 4.0–10.5)

## 2015-06-17 LAB — HEMOGLOBIN A1C
HEMOGLOBIN A1C: 4.3 % — AB (ref 4.8–5.6)
MEAN PLASMA GLUCOSE: 77 mg/dL

## 2015-06-17 NOTE — Progress Notes (Signed)
Subjective:   Patient seen during HD. Tolerating well. BFR 400. DFR 800    Objective:  Vital signs in last 24 hours:   132/75  83  99.4  Weight change:  There were no vitals filed for this visit.  Intake/Output:       Physical Exam: General: NAD laying in bed  HEENT NGT, anicteric  Neck supple  Pulm/lungs Coarse b/l vent, trach. Fio2 50%  CVS/Heart Regular, no rub  Abdomen:  Soft, non tender  Extremities: ++ dependent edema  Neurologic: Alert, able to nod yes/no  Skin: Warm, no acute rashes  Access: Rt IJ PC       Basic Metabolic Panel:  Recent Labs Lab 06/16/15 0530 06/17/15 0600  NA 131*  132* 129*  K 4.8  4.8 5.6*  CL 101  102 99*  CO2 26  26 19*  GLUCOSE 88  89 63*  BUN 28*  29* 35*  CREATININE 1.45*  1.44* 1.73*  CALCIUM 8.4*  8.5*  8.1* 8.4*  MG 1.9  --   PHOS 3.7  3.7 3.4     CBC:  Recent Labs Lab 06/16/15 0530 06/17/15 0600  WBC 7.6 11.0*  NEUTROABS 4.9  --   HGB 9.6* 11.0*  HCT 30.0* 33.8*  MCV 100.7* 98.5  PLT 200 228      Microbiology: Results for orders placed or performed during the hospital encounter of 06/15/15  Culture, respiratory (NON-Expectorated)     Status: None (Preliminary result)   Collection Time: 06/16/15  3:15 PM  Result Value Ref Range Status   Specimen Description TRACHEAL ASPIRATE  Final   Special Requests NONE  Final   Gram Stain   Final    ABUNDANT WBC PRESENT,BOTH PMN AND MONONUCLEAR RARE SQUAMOUS EPITHELIAL CELLS PRESENT NO ORGANISMS SEEN Performed at [facility]    Culture PENDING  Incomplete   Report Status PENDING  Incomplete    Coagulation Studies: No results for input(s): LABPROT, INR in the last 72 hours.  Urinalysis: No results for input(s): COLORURINE, LABSPEC, PHURINE, GLUCOSEU, HGBUR, BILIRUBINUR, KETONESUR, PROTEINUR, UROBILINOGEN, NITRITE, LEUKOCYTESUR in the last 72 hours.  Invalid input(s): APPERANCEUR    Imaging: Dg Chest Port 1 View  06/15/2015    CLINICAL DATA:  Nasogastric tube placement  EXAM: PORTABLE CHEST - 1 VIEW  COMPARISON:  01/27/2012  FINDINGS: Nasogastric tube coursing below the diaphragm.  Tracheostomy tube in satisfactory position. Right jugular central venous catheter in satisfactory position. Dual-lumen right jugular central venous catheter in satisfactory position. Stable cardiomegaly. Mild bilateral interstitial prominence. No pleural effusion, pneumothorax or focal consolidation. No acute osseous abnormality. Severe osteopenia.  IMPRESSION: Nasogastric tube coursing below the diaphragm with the tip excluded from the field of view.   Electronically Signed   By: Elige Ko   On: 06/15/2015 16:19   Dg Abd Portable 1v  06/15/2015   CLINICAL DATA:  Assess position of the nasogastric tube  EXAM: PORTABLE ABDOMEN - 1 VIEW  COMPARISON:  None available in PACs  FINDINGS: The esophagogastric tube tip projects in the region of the pylorus of the stomach. The proximal port is in the gastric body. There is mild gaseous distention of the stomach. There is lucency over the left mid abdomen that extends outside of the confines of the left lateral abdominal wall.  IMPRESSION: 1. The esophagogastric tube appears to be in appropriate position with the tip in the region of the pylorus. 2. There is lucency in the left mid abdomen that appears to  extend outside the confines of the abdominal wall but evaluation is limited due to the field of view. A three-way abdominal series is recommended. 3. These results will be called to the ordering clinician or representative by the Radiologist Assistant, and communication documented in the PACS or zVision Dashboard.   Electronically Signed   By: David  [place] M.D.   On: 06/15/2015 16:18     Medications:   Lisinopril 40 daily vanc oral    Assessment/ Plan:  49 y.o. 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  1. End-stage renal disease on hemodialysis. The patient has been on also since October of 2014 per her husband. We will maintain the patient on dialysis schedule Monday, Wednesday, Friday while here at our facility.  Patient seen during dialysis Tolerating well   2. Anemia chronic kidney disease.  - start aranesp if hgb is trending down.  3. Secondary hyperparathyroidism.  - Phos 3.4  4. Chronic respiratory failure. The patient has tracheostomy in place.  5. Enterocutaneous fistula. This has been one of the big issues the patient has dealt with while at [facility]. She currently has an NG tube in place. Further evaluation and management per hospitalist staff..   LOSMosetta Pigeon 7/29/20163:55 PM

## 2015-06-19 LAB — RENAL FUNCTION PANEL
ALBUMIN: 1.3 g/dL — AB (ref 3.5–5.0)
Anion gap: 9 (ref 5–15)
BUN: 37 mg/dL — ABNORMAL HIGH (ref 6–20)
CO2: 23 mmol/L (ref 22–32)
Calcium: 8.3 mg/dL — ABNORMAL LOW (ref 8.9–10.3)
Chloride: 102 mmol/L (ref 101–111)
Creatinine, Ser: 1.64 mg/dL — ABNORMAL HIGH (ref 0.44–1.00)
GFR calc Af Amer: 41 mL/min — ABNORMAL LOW (ref >60)
GFR calc non Af Amer: 36 mL/min — ABNORMAL LOW (ref >60)
Glucose, Bld: 107 mg/dL — ABNORMAL HIGH (ref 65–99)
PHOSPHORUS: 3.4 mg/dL (ref 2.5–4.6)
Potassium: 4.7 mmol/L (ref 3.5–5.1)
Sodium: 134 mmol/L — ABNORMAL LOW (ref 135–145)

## 2015-06-19 LAB — CBC
HCT: 43.3 % (ref 36.0–46.0)
HEMOGLOBIN: 14.2 g/dL (ref 12.0–15.0)
MCH: 31.8 pg (ref 26.0–34.0)
MCHC: 32.8 g/dL (ref 30.0–36.0)
MCV: 97.1 fL (ref 78.0–100.0)
Platelets: 132 10*3/uL — ABNORMAL LOW (ref 150–400)
RBC: 4.46 MIL/uL (ref 3.87–5.11)
RDW: 15.8 % — ABNORMAL HIGH (ref 11.5–15.5)
WBC: 5.8 10*3/uL (ref 4.0–10.5)

## 2015-06-20 DIAGNOSIS — J9612 Chronic respiratory failure with hypercapnia: Secondary | ICD-10-CM | POA: Diagnosis not present

## 2015-06-20 DIAGNOSIS — Z930 Tracheostomy status: Secondary | ICD-10-CM | POA: Diagnosis not present

## 2015-06-20 LAB — CBC
HCT: 29.3 % — ABNORMAL LOW (ref 36.0–46.0)
Hemoglobin: 9.5 g/dL — ABNORMAL LOW (ref 12.0–15.0)
MCH: 31.6 pg (ref 26.0–34.0)
MCHC: 32.4 g/dL (ref 30.0–36.0)
MCV: 97.3 fL (ref 78.0–100.0)
Platelets: 193 10*3/uL (ref 150–400)
RBC: 3.01 MIL/uL — AB (ref 3.87–5.11)
RDW: 15.6 % — ABNORMAL HIGH (ref 11.5–15.5)
WBC: 7.6 10*3/uL (ref 4.0–10.5)

## 2015-06-20 LAB — RENAL FUNCTION PANEL
Albumin: 1.2 g/dL — ABNORMAL LOW (ref 3.5–5.0)
Anion gap: 10 (ref 5–15)
BUN: 51 mg/dL — ABNORMAL HIGH (ref 6–20)
CO2: 24 mmol/L (ref 22–32)
Calcium: 8.4 mg/dL — ABNORMAL LOW (ref 8.9–10.3)
Chloride: 99 mmol/L — ABNORMAL LOW (ref 101–111)
Creatinine, Ser: 1.97 mg/dL — ABNORMAL HIGH (ref 0.44–1.00)
GFR calc Af Amer: 33 mL/min — ABNORMAL LOW (ref >60)
GFR, EST NON AFRICAN AMERICAN: 28 mL/min — AB (ref >60)
Glucose, Bld: 80 mg/dL (ref 65–99)
POTASSIUM: 4.4 mmol/L (ref 3.5–5.1)
Phosphorus: 3.7 mg/dL (ref 2.5–4.6)
SODIUM: 133 mmol/L — AB (ref 135–145)

## 2015-06-20 LAB — CULTURE, RESPIRATORY

## 2015-06-20 LAB — CULTURE, RESPIRATORY W GRAM STAIN

## 2015-06-20 LAB — HEPATITIS B SURFACE ANTIGEN: HEP B S AG: NEGATIVE

## 2015-06-20 NOTE — Progress Notes (Signed)
Name: Courtney [name] MRN: [id number] DOB: [birth year]    ADMISSION DATE:  06/15/2015 CONSULTATION DATE:  7/28   REFERRING MD :  Sharyon Medicus   CHIEF COMPLAINT:  Vent weaning/trach care  BRIEF PATIENT DESCRIPTION:   49 y.o. female with a very complex PMHx of morbid obesity, DM, OSA, osteo of spine, ESRD who was s/p status post gastric bypass surgery. 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 At time of admission she was on night time ventilation, getting wound care wet to dry dressings on sacrum, and progressing w/ PT. PCCM was asked to assist w/ weaning efforts.   SIGNIFICANT EVENTS    STUDIES:   Subjective/Interval Hx:  No acute change.  Tol ATC daytyime, qhs vent.  "hungry".  Tolerating PO's, followed closely by speech.   VITAL SIGNS: Reviewed at bedside, abnormal values discussed in A/P.   PHYSICAL EXAMINATION: General:  Chronically ill appearing 49 year old female. Awake, NAD  Neuro: awake, follows commands, answers questions but slow to respond.  HEENT:  Trach midline, facial adiposity  Cardiovascular:  rrr Lungs:  resps even non labored on ATC with PMV Abdomen:  Obese, mid abd incision CD Musculoskeletal:  Flaccid  Skin:  Sacral wound not assessed.   CBC Recent Labs     06/19/15  0641  06/20/15  0606  WBC  5.8  7.6  HGB  14.2  9.5*  HCT  43.3  29.3*  PLT  132*  193    Coag's No results for input(s): APTT, INR in the last 72 hours.  BMET Recent Labs     06/19/15  0641  06/20/15  0606  NA  134*  133*  K  4.7  4.4  CL  102  99*  CO2  23  24  BUN  37*  51*    CREATININE  1.64*  1.97*  GLUCOSE  107*  80    Electrolytes Recent Labs     06/19/15  0641  06/20/15  0606  CALCIUM  8.3*  8.4*  PHOS  3.4  3.7    Sepsis Markers No results for input(s): PROCALCITON, O2SATVEN in the last 72 hours.  Invalid input(s): LACTICACIDVEN  ABG No results for input(s): PHART, PCO2ART, PO2ART in the last 72 hours.  Liver Enzymes Recent Labs     06/19/15  0641  06/20/15  0606  ALBUMIN  1.3*  1.2*    Cardiac Enzymes No results for input(s): TROPONINI, PROBNP in the last 72 hours.  Glucose No results for input(s): GLUCAP in the last 72 hours.  Imaging No results found.     ASSESSMENT / PLAN:  Stage IV sacral wound w/ h/o treated osteo and bacteremia  Tracheostomy status OSA Acute on Chronic hypercarbic respiratory failure w/ ventilator dependence Severe deconditioning Severe PCM C diff colitis (active) DVT Acute encephalopathy  H/o adrenal insuff DM Anemia of critical illness  Discussion  49 year old female s/p prolonged critical illness initially a consequence of gastric bypass w/ gastric sleeve c/b several abd abscess, abd fistula and recurrent infection (mrsa bacteremia, mrsa osteo, pseudomonas and CRE UTI). Re-admitted to [facility] Jan 2016 due to stage IV sacral wound/osteo  and bacteremia. Had made progress over the last 7 months to the point that she was actually tolerating GI diet, nocturnal ventilation, ATC during day and progressing slowly from a PT stand-point. PCCM was asked to see for trach/vent assist.  Plan Cont ATC daytime with qhs vent as tol  PMV as tol  Ongoing speech rx for phonation and PO intake  Poor candidate for decannulation in near future  Avoid sedation  Cont nutritional support Oral vanc for CDiff  Cont PT efforts as MS will allow Cont wound care per [facility], NP 06/20/2015  11:59 AM Pager: [phone] or [phone]

## 2015-06-20 NOTE — Progress Notes (Signed)
Subjective:  Pt awake and alert this AM. Resting comfortably. Due for HD today.  Trach in place.    Objective:  Vital signs in last 24 hours:   97.2 61 25 124/92    Physical Exam: General: NAD laying in bed  HEENT NGT, anicteric  Neck Supple trach in place  Pulm/lungs Coarse b/l vent, trach. Fio2 50%  CVS/Heart Regular, no rub  Abdomen:  Soft, non tender, BS present  Extremities: 2+ b/l Le edema  Neurologic: Awake, alert, able to answer questions with nod  Skin: Warm, no acute rashes  Access: Rt IJ PC       Basic Metabolic Panel:  Recent Labs Lab 06/16/15 0530 06/17/15 0600 06/19/15 0641  NA 131*  132* 129* 134*  K 4.8  4.8 5.6* 4.7  CL 101  102 99* 102  CO2 26  26 19* 23  GLUCOSE 88  89 63* 107*  BUN 28*  29* 35* 37*  CREATININE 1.45*  1.44* 1.73* 1.64*  CALCIUM 8.4*  8.5*  8.1* 8.4* 8.3*  MG 1.9  --   --   PHOS 3.7  3.7 3.4 3.4     CBC:  Recent Labs Lab 06/16/15 0530 06/17/15 0600 06/19/15 0641  WBC 7.6 11.0* 5.8  NEUTROABS 4.9  --   --   HGB 9.6* 11.0* 14.2  HCT 30.0* 33.8* 43.3  MCV 100.7* 98.5 97.1  PLT 200 228 132*      Microbiology: Results for orders placed or performed during the hospital encounter of 06/15/15  Culture, respiratory (NON-Expectorated)     Status: None (Preliminary result)   Collection Time: 06/16/15  3:15 PM  Result Value Ref Range Status   Specimen Description TRACHEAL ASPIRATE  Final   Special Requests NONE  Final   Gram Stain   Final    ABUNDANT WBC PRESENT,BOTH PMN AND MONONUCLEAR RARE SQUAMOUS EPITHELIAL CELLS PRESENT NO ORGANISMS SEEN Performed at [facility]    Culture   Final    FEW PSEUDOMONAS AERUGINOSA FEW GRAM NEGATIVE RODS Performed at [facility]    Report Status PENDING  Incomplete  Culture, blood (routine x 2)     Status: None (Preliminary result)   Collection Time: 06/16/15  3:30 PM  Result Value Ref Range Status   Specimen Description BLOOD RIGHT HAND  Final    Special Requests IN PEDIATRIC BOTTLE 3CC  Final   Culture NO GROWTH 3 DAYS  Final   Report Status PENDING  Incomplete  Culture, blood (routine x 2)     Status: None (Preliminary result)   Collection Time: 06/16/15  3:40 PM  Result Value Ref Range Status   Specimen Description BLOOD LEFT HAND  Final   Special Requests IN PEDIATRIC BOTTLE 3CC  Final   Culture NO GROWTH 3 DAYS  Final   Report Status PENDING  Incomplete    Coagulation Studies: No results for input(s): LABPROT, INR in the last 72 hours.  Urinalysis: No results for input(s): COLORURINE, LABSPEC, PHURINE, GLUCOSEU, HGBUR, BILIRUBINUR, KETONESUR, PROTEINUR, UROBILINOGEN, NITRITE, LEUKOCYTESUR in the last 72 hours.  Invalid input(s): APPERANCEUR    Imaging: No results found.   Medications:   Lisinopril 40 daily vanc oral    Assessment/ Plan:  49 y.o. 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  1. End-stage renal disease on hemodialysis. The patient has been on dialysis since October of 2014. -Pt due for HD today, orders prepared, continue HD on MWF schedule.  2. Anemia of CKD:  hgb actually quite high at the moment.  No need for aranesp at present.  3. Secondary hyperparathyroidism.  - check phos today prior to HD.  4. Chronic respiratory failure. Tracheostomy remains in place.  5. Enterocutaneous fistula. This has been one of the  big issues the patient has dealt with while at [facility]. She currently has an NG tube in place. Further evaluation and management per hospitalist staff..   LOS:  Courtney [name] 8/1/20167:14 AM

## 2015-06-21 LAB — CULTURE, BLOOD (ROUTINE X 2)
CULTURE: NO GROWTH
CULTURE: NO GROWTH

## 2015-06-22 LAB — RENAL FUNCTION PANEL
ANION GAP: 6 (ref 5–15)
Albumin: 1.4 g/dL — ABNORMAL LOW (ref 3.5–5.0)
BUN: 47 mg/dL — AB (ref 6–20)
CO2: 28 mmol/L (ref 22–32)
CREATININE: 1.71 mg/dL — AB (ref 0.44–1.00)
Calcium: 8.2 mg/dL — ABNORMAL LOW (ref 8.9–10.3)
Chloride: 98 mmol/L — ABNORMAL LOW (ref 101–111)
GFR calc Af Amer: 39 mL/min — ABNORMAL LOW (ref >60)
GFR calc non Af Amer: 34 mL/min — ABNORMAL LOW (ref >60)
GLUCOSE: 90 mg/dL (ref 65–99)
PHOSPHORUS: 3.1 mg/dL (ref 2.5–4.6)
POTASSIUM: 4 mmol/L (ref 3.5–5.1)
SODIUM: 132 mmol/L — AB (ref 135–145)

## 2015-06-22 LAB — CBC
HCT: 27.5 % — ABNORMAL LOW (ref 36.0–46.0)
HEMOGLOBIN: 8.9 g/dL — AB (ref 12.0–15.0)
MCH: 31.9 pg (ref 26.0–34.0)
MCHC: 32.4 g/dL (ref 30.0–36.0)
MCV: 98.6 fL (ref 78.0–100.0)
Platelets: 167 10*3/uL (ref 150–400)
RBC: 2.79 MIL/uL — ABNORMAL LOW (ref 3.87–5.11)
RDW: 15.5 % (ref 11.5–15.5)
WBC: 7.5 10*3/uL (ref 4.0–10.5)

## 2015-06-22 NOTE — Progress Notes (Signed)
Subjective:  Pt awake and alert  Able to answer questions and follow simple commands Dialyzed earlier today Net uf -1500 cc Tf  cc/hr   Objective:  Vital signs in last 24 hours:  Temperature 90.8 pulse 76 respirations 18 blood pressure 126/76.     Physical Exam: General: NAD laying in bed  HEENT NGT, anicteric  Neck Supple trach in place  Pulm/lungs Coarse b/l , trach collar  CVS/Heart Regular, no rub  Abdomen:  Soft, non tender, BS present  Extremities: 2+ b/l Le edema  Neurologic: Awake, alert, able to answer questions  Skin: Warm, no acute rashes  Access: Rt IJ PC       Basic Metabolic Panel:  Recent Labs Lab 06/16/15 0530 06/17/15 0600 06/19/15 0641 06/20/15 0606 06/22/15 0600  NA 131*  132* 129* 134* 133* 132*  K 4.8  4.8 5.6* 4.7 4.4 4.0  CL 101  102 99* 102 99* 98*  CO2 26  26 19* GLUCOSE 88  89 63* 107* 80 90  BUN 28*  29* 35* 37* 51* 47*  CREATININE 1.45*  1.44* 1.73* 1.64* 1.97* 1.71*  CALCIUM 8.4*  8.5*  8.1* 8.4* 8.3* 8.4* 8.2*  MG 1.9  --   --   --   --   PHOS 3.7  3.7 3.4 3.4 3.7 3.1     CBC:  Recent Labs Lab 06/16/15 0530 06/17/15 0600 06/19/15 0641 06/20/15 0606 06/22/15 0600  WBC 7.6 11.0* 5.8 7.6 7.5  NEUTROABS 4.9  --   --   --   --   HGB 9.6* 11.0* 14.2 9.5* 8.9*  HCT 30.0* 33.8* 43.3 29.3* 27.5*  MCV 100.7* 98.5 97.1 97.3 98.6  PLT 200 228 132* 193 167      Microbiology: Results for orders placed or performed during the hospital encounter of 06/15/15  Culture, respiratory (NON-Expectorated)     Status: None   Collection Time: 06/16/15  3:15 PM  Result Value Ref Range Status   Specimen Description TRACHEAL ASPIRATE  Final   Special Requests NONE  Final   Gram Stain   Final    ABUNDANT WBC PRESENT,BOTH PMN AND MONONUCLEAR RARE SQUAMOUS EPITHELIAL CELLS PRESENT NO ORGANISMS SEEN Performed at [facility]    Culture   Final    FEW PSEUDOMONAS AERUGINOSA FEW STENOTROPHOMONAS  MALTOPHILIA Performed at [facility]    Report Status 06/20/2015 FINAL  Final   Organism ID, Bacteria PSEUDOMONAS AERUGINOSA  Final   Organism ID, Bacteria STENOTROPHOMONAS MALTOPHILIA  Final      Susceptibility   Pseudomonas aeruginosa - MIC*    CEFEPIME 8 SENSITIVE Sensitive     CIPROFLOXACIN >=4 RESISTANT Resistant     GENTAMICIN 8 INTERMEDIATE Intermediate     IMIPENEM 2 SENSITIVE Sensitive     TOBRAMYCIN <=1 SENSITIVE Sensitive     * FEW PSEUDOMONAS AERUGINOSA   Stenotrophomonas maltophilia - MIC*    TRIMETH/SULFA <=20 SENSITIVE Sensitive     LEVOFLOXACIN 1 SENSITIVE Sensitive     * FEW STENOTROPHOMONAS MALTOPHILIA  Culture, blood (routine x 2)     Status: None   Collection Time: 06/16/15  3:30 PM  Result Value Ref Range Status   Specimen Description BLOOD RIGHT HAND  Final   Special Requests IN PEDIATRIC BOTTLE 3CC  Final   Culture NO GROWTH 5 DAYS  Final   Report Status 06/21/2015 FINAL  Final  Culture, blood (routine x 2)     Status: None  Collection Time: 06/16/15  3:40 PM  Result Value Ref Range Status   Specimen Description BLOOD LEFT HAND  Final   Special Requests IN PEDIATRIC BOTTLE 3CC  Final   Culture NO GROWTH 5 DAYS  Final   Report Status 06/21/2015 FINAL  Final    Coagulation Studies: No results for input(s): LABPROT, INR in the last 72 hours.  Urinalysis: No results for input(s): COLORURINE, LABSPEC, PHURINE, GLUCOSEU, HGBUR, BILIRUBINUR, KETONESUR, PROTEINUR, UROBILINOGEN, NITRITE, LEUKOCYTESUR in the last 72 hours.  Invalid input(s): APPERANCEUR    Imaging: No results found.   Medications:   Lisinopril 40 daily vanc oral    Assessment/ Plan:  49 y.o. female with a very complex PMHx of morbid obesity status post gastric bypass surgery with severe subsequent complications, respiratory failure with tracheostomy placement, end-stage renal disease on hemodialysis, history of cardiac arrest, history of enterocutaneous fistula with  leakage from the duodenum, history of a portion of a DVT, diabetes mellitus type 2, obstructive sleep apnea, stage IV sacral decubitus ulcer, history of osteomyelitis of the spine, malnutrition, who was admitted to [facility] on 06/15/2015 for evaluation and treatment of her ongoing medical issues most related to enterocutaneous fistula.  1. End-stage renal disease on hemodialysis. The patient has been on dialysis since October of 2014. -continue HD on MWF schedule.  2. Anemia of CKD:  hgb 8.9; resume aranesp   3. Secondary hyperparathyroidism.  -  Phos 3.1  4. Chronic respiratory failure. Tracheostomy remains in place.  5. Enterocutaneous fistula. This has been one of the big issues the patient has dealt with while at [facility]. She currently has an NG tube in place. Further evaluation and management per hospitalist staff..   LOSMosetta Pigeon 8/3/20164:07 PM

## 2015-06-24 LAB — CBC
HEMATOCRIT: 28.7 % — AB (ref 36.0–46.0)
Hemoglobin: 9.2 g/dL — ABNORMAL LOW (ref 12.0–15.0)
MCH: 31 pg (ref 26.0–34.0)
MCHC: 32.1 g/dL (ref 30.0–36.0)
MCV: 96.6 fL (ref 78.0–100.0)
Platelets: 181 10*3/uL (ref 150–400)
RBC: 2.97 MIL/uL — AB (ref 3.87–5.11)
RDW: 15.3 % (ref 11.5–15.5)
WBC: 7 10*3/uL (ref 4.0–10.5)

## 2015-06-24 LAB — RENAL FUNCTION PANEL
ALBUMIN: 1.3 g/dL — AB (ref 3.5–5.0)
Anion gap: 9 (ref 5–15)
BUN: 42 mg/dL — ABNORMAL HIGH (ref 6–20)
CHLORIDE: 96 mmol/L — AB (ref 101–111)
CO2: 27 mmol/L (ref 22–32)
Calcium: 8.3 mg/dL — ABNORMAL LOW (ref 8.9–10.3)
Creatinine, Ser: 1.8 mg/dL — ABNORMAL HIGH (ref 0.44–1.00)
GFR calc Af Amer: 37 mL/min — ABNORMAL LOW (ref >60)
GFR, EST NON AFRICAN AMERICAN: 32 mL/min — AB (ref >60)
Glucose, Bld: 78 mg/dL (ref 65–99)
Phosphorus: 2.9 mg/dL (ref 2.5–4.6)
Potassium: 3.8 mmol/L (ref 3.5–5.1)
Sodium: 132 mmol/L — ABNORMAL LOW (ref 135–145)

## 2015-06-24 NOTE — Progress Notes (Signed)
Subjective:  Pt reports back pain today.  To be dialyzed later today.  Currently on ventilator.  FiO2 28%, PEEP of 5    Objective:  Vital signs in last 24 hours:  Temperature 90.8, pulse 75, respirations 20, blood pressure 130/78.     Physical Exam: General: NAD laying in bed  HEENT NGT, anicteric  Neck Supple trach in place,   Pulm/lungs Coarse b/l , trach collar, vent supported  CVS/Heart Regular, no rub  Abdomen:  Soft, non tender, BS present  Extremities: 2+ b/l Le edema  Neurologic: Awake, alert, able to answer questions  Skin: Warm, no acute rashes  Access: Rt IJ PC       Basic Metabolic Panel:  Recent Labs Lab 06/19/15 0641 06/20/15 0606 06/22/15 0600 06/24/15 0500  NA 134* 133* 132* 132*  K 4.7 4.4 4.0 3.8  CL 102 99* 98* 96*  CO2 23 24 28 27   GLUCOSE 107* 80 90 78  BUN 37* 51* 47* 42*  CREATININE 1.64* 1.97* 1.71* 1.80*  CALCIUM 8.3* 8.4* 8.2* 8.3*  PHOS 3.4 3.7 3.1 2.9     CBC:  Recent Labs Lab 06/19/15 0641 06/20/15 0606 06/22/15 0600 06/24/15 0635  WBC 5.8 7.6 7.5 7.0  HGB 14.2 9.5* 8.9* 9.2*  HCT 43.3 29.3* 27.5* 28.7*  MCV 97.1 97.3 98.6 96.6  PLT 132* 193 167 181      Microbiology: Results for orders placed or performed during the hospital encounter of 06/15/15  Culture, respiratory (NON-Expectorated)     Status: None   Collection Time: 06/16/15  3:15 PM  Result Value Ref Range Status   Specimen Description TRACHEAL ASPIRATE  Final   Special Requests NONE  Final   Gram Stain   Final    ABUNDANT WBC PRESENT,BOTH PMN AND MONONUCLEAR RARE SQUAMOUS EPITHELIAL CELLS PRESENT NO ORGANISMS SEEN Performed at [facility]    Culture   Final    FEW PSEUDOMONAS AERUGINOSA FEW STENOTROPHOMONAS MALTOPHILIA Performed at [facility]    Report Status 06/20/2015 FINAL  Final   Organism ID, Bacteria PSEUDOMONAS AERUGINOSA  Final   Organism ID, Bacteria STENOTROPHOMONAS MALTOPHILIA  Final      Susceptibility   Pseudomonas aeruginosa - MIC*    CEFEPIME 8 SENSITIVE Sensitive     CIPROFLOXACIN >=4 RESISTANT Resistant     GENTAMICIN 8 INTERMEDIATE Intermediate     IMIPENEM 2 SENSITIVE Sensitive     TOBRAMYCIN <=1 SENSITIVE Sensitive     * FEW PSEUDOMONAS AERUGINOSA   Stenotrophomonas maltophilia - MIC*    TRIMETH/SULFA <=20 SENSITIVE Sensitive     LEVOFLOXACIN 1 SENSITIVE Sensitive     * FEW STENOTROPHOMONAS MALTOPHILIA  Culture, blood (routine x 2)     Status: None   Collection Time: 06/16/15  3:30 PM  Result Value Ref Range Status   Specimen Description BLOOD RIGHT HAND  Final   Special Requests IN PEDIATRIC BOTTLE 3CC  Final   Culture NO GROWTH 5 DAYS  Final   Report Status 06/21/2015 FINAL  Final  Culture, blood (routine x 2)     Status: None   Collection Time: 06/16/15  3:40 PM  Result Value Ref Range Status   Specimen Description BLOOD LEFT HAND  Final   Special Requests IN PEDIATRIC BOTTLE 3CC  Final   Culture NO GROWTH 5 DAYS  Final   Report Status 06/21/2015 FINAL  Final    Coagulation Studies: No results for input(s): LABPROT, INR in the last 72 hours.  Urinalysis: No  results for input(s): COLORURINE, LABSPEC, PHURINE, GLUCOSEU, HGBUR, BILIRUBINUR, KETONESUR, PROTEINUR, UROBILINOGEN, NITRITE, LEUKOCYTESUR in the last 72 hours.  Invalid input(s): APPERANCEUR    Imaging: No results found.   Medications:   Lisinopril 40 daily vanc oral    Assessment/ Plan:  49 y.o. 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  1. End-stage renal disease on hemodialysis. The patient has been on dialysis since October of 2014. - continue MWF schedule  2. Anemia of CKD:  hgb 9.2; continue aranesp   3. Secondary hyperparathyroidism.  -  Phos 2.9  4. Chronic respiratory failure. Tracheostomy remains in place. Ventilator support at night  5. Enterocutaneous fistula. This has been one of the big issues the patient has dealt with while at [facility]. She currently has an NG tube in place. Further evaluation and management per hospitalist staff..   LOSMosetta Pigeon 8/5/201611:40 AM

## 2015-06-27 DIAGNOSIS — J9620 Acute and chronic respiratory failure, unspecified whether with hypoxia or hypercapnia: Secondary | ICD-10-CM | POA: Diagnosis not present

## 2015-06-27 DIAGNOSIS — Z930 Tracheostomy status: Secondary | ICD-10-CM

## 2015-06-27 LAB — RENAL FUNCTION PANEL
ALBUMIN: 1.4 g/dL — AB (ref 3.5–5.0)
ANION GAP: 10 (ref 5–15)
BUN: 53 mg/dL — AB (ref 6–20)
CHLORIDE: 97 mmol/L — AB (ref 101–111)
CO2: 26 mmol/L (ref 22–32)
Calcium: 8.4 mg/dL — ABNORMAL LOW (ref 8.9–10.3)
Creatinine, Ser: 2.32 mg/dL — ABNORMAL HIGH (ref 0.44–1.00)
GFR calc Af Amer: 27 mL/min — ABNORMAL LOW (ref >60)
GFR calc non Af Amer: 23 mL/min — ABNORMAL LOW (ref >60)
Glucose, Bld: 124 mg/dL — ABNORMAL HIGH (ref 65–99)
PHOSPHORUS: 3.4 mg/dL (ref 2.5–4.6)
Potassium: 4.1 mmol/L (ref 3.5–5.1)
SODIUM: 133 mmol/L — AB (ref 135–145)

## 2015-06-27 LAB — CBC
HCT: 28.4 % — ABNORMAL LOW (ref 36.0–46.0)
HEMOGLOBIN: 9 g/dL — AB (ref 12.0–15.0)
MCH: 30.7 pg (ref 26.0–34.0)
MCHC: 31.7 g/dL (ref 30.0–36.0)
MCV: 96.9 fL (ref 78.0–100.0)
Platelets: 221 10*3/uL (ref 150–400)
RBC: 2.93 MIL/uL — ABNORMAL LOW (ref 3.87–5.11)
RDW: 15.1 % (ref 11.5–15.5)
WBC: 9.4 10*3/uL (ref 4.0–10.5)

## 2015-06-27 NOTE — Progress Notes (Signed)
Subjective:  Pt seen during HD. Tolerating well at this time.  Trach capped.    Objective:  Vital signs in last 24 hours:  97.3 76 20 157/80    Physical Exam: General: NAD laying in bed  HEENT NGT, anicteric  Neck Supple trach in place and capped   Pulm/lungs Coarse b/l , normal effort  CVS/Heart Regular, no rub  Abdomen:  Soft, non tender, BS present  Extremities: 2+ b/l Le edema  Neurologic: Awake, alert, follows simple commands  Skin: Warm, no acute rashes  Access: Rt IJ PC       Basic Metabolic Panel:  Recent Labs Lab 06/22/15 0600 06/24/15 0500 06/27/15 0525  NA 132* 132* 133*  K 4.0 3.8 4.1  CL 98* 96* 97*  CO2 GLUCOSE 90 78 124*  BUN 47* 42* 53*  CREATININE 1.71* 1.80* 2.32*  CALCIUM 8.2* 8.3* 8.4*  PHOS 3.1 2.9 3.4     CBC:  Recent Labs Lab 06/22/15 0600 06/24/15 0635 06/27/15 0525  WBC 7.5 7.0 9.4  HGB 8.9* 9.2* 9.0*  HCT 27.5* 28.7* 28.4*  MCV 98.6 96.6 96.9  PLT 167 181 221      Microbiology: Results for orders placed or performed during the hospital encounter of 06/15/15  Culture, respiratory (NON-Expectorated)     Status: None   Collection Time: 06/16/15  3:15 PM  Result Value Ref Range Status   Specimen Description TRACHEAL ASPIRATE  Final   Special Requests NONE  Final   Gram Stain   Final    ABUNDANT WBC PRESENT,BOTH PMN AND MONONUCLEAR RARE SQUAMOUS EPITHELIAL CELLS PRESENT NO ORGANISMS SEEN Performed at [facility]    Culture   Final    FEW PSEUDOMONAS AERUGINOSA FEW STENOTROPHOMONAS MALTOPHILIA Performed at [facility]    Report Status 06/20/2015 FINAL  Final   Organism ID, Bacteria PSEUDOMONAS AERUGINOSA  Final   Organism ID, Bacteria STENOTROPHOMONAS MALTOPHILIA  Final      Susceptibility   Pseudomonas aeruginosa - MIC*    CEFEPIME 8 SENSITIVE Sensitive     CIPROFLOXACIN >=4 RESISTANT Resistant     GENTAMICIN 8 INTERMEDIATE Intermediate     IMIPENEM 2 SENSITIVE Sensitive    TOBRAMYCIN <=1 SENSITIVE Sensitive     * FEW PSEUDOMONAS AERUGINOSA   Stenotrophomonas maltophilia - MIC*    TRIMETH/SULFA <=20 SENSITIVE Sensitive     LEVOFLOXACIN 1 SENSITIVE Sensitive     * FEW STENOTROPHOMONAS MALTOPHILIA  Culture, blood (routine x 2)     Status: None   Collection Time: 06/16/15  3:30 PM  Result Value Ref Range Status   Specimen Description BLOOD RIGHT HAND  Final   Special Requests IN PEDIATRIC BOTTLE 3CC  Final   Culture NO GROWTH 5 DAYS  Final   Report Status 06/21/2015 FINAL  Final  Culture, blood (routine x 2)     Status: None   Collection Time: 06/16/15  3:40 PM  Result Value Ref Range Status   Specimen Description BLOOD LEFT HAND  Final   Special Requests IN PEDIATRIC BOTTLE 3CC  Final   Culture NO GROWTH 5 DAYS  Final   Report Status 06/21/2015 FINAL  Final    Coagulation Studies: No results for input(s): LABPROT, INR in the last 72 hours.  Urinalysis: No results for input(s): COLORURINE, LABSPEC, PHURINE, GLUCOSEU, HGBUR, BILIRUBINUR, KETONESUR, PROTEINUR, UROBILINOGEN, NITRITE, LEUKOCYTESUR in the last 72 hours.  Invalid input(s): APPERANCEUR    Imaging: No results found.   Medications:  Lisinopril 40 daily vanc oral aranesp 60   Assessment/ Plan:  49 y.o. 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  1. End-stage renal disease on hemodialysis. The patient has been on dialysis  since October of 2014. - Pt seen during HD, tolerating well thus far, complete HD today, next HD on Wednesday.  2. Anemia of CKD:  hgb 9.0, continue aranesp [place] weekly.  3. Secondary hyperparathyroidism.  -  Phos 3.4 and acceptable, continue to monitor.  4. Chronic respiratory failure. Seems to be tolerating trach well at this time.   5. Enterocutaneous fistula. This has been one of the big issues the patient has dealt with while at [facility]. She currently has an NG tube in place.    Courtney [name] 8/8/20164:15 PM

## 2015-06-27 NOTE — Progress Notes (Addendum)
Name: Courtney [name] MRN: [id number] DOB: 02-25-1966    ADMISSION DATE:  06/15/2015 CONSULTATION DATE:  7/28   REFERRING MD :  Sharyon Medicus   CHIEF COMPLAINT:  Vent weaning/trach care  BRIEF PATIENT DESCRIPTION:   49 y.o. female with a very complex PMHx of morbid obesity, DM, OSA, osteo of spine, ESRD who was s/p status post gastric bypass surgery. 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 At time of admission she was on night time ventilation, getting wound care wet to dry dressings on sacrum, and progressing w/ PT. PCCM was asked to assist w/ weaning efforts.   49 year old female s/p prolonged critical illness initially a consequence of gastric bypass w/ gastric sleeve c/b several abd abscess, abd fistula and recurrent infection (mrsa bacteremia, mrsa osteo, pseudomonas and CRE UTI). Re-admitted to [facility] Jan 2016 due to stage IV sacral wound/osteo and bacteremia. Had made progress over the last 7 months to the point that she was actually tolerating GI diet, nocturnal ventilation, ATC during day and progressing slowly from a PT stand-point. PCCM was asked to see for trach/vent assist.    SIGNIFICANT EVENTS  8/1/1^: No acute change.  Tol ATC daytyime, qhs vent.  "hungry".  Tolerating PO's, followed closely by speech.    SUBJECTIVE/OVERNIGHT/INTERVAL HX 06/27/2015: On Trach. Follows commands. Conversing per RN and RT. On ATC during day. Night rests on vent. No fever. Not on pressors. On c HD MWF - has temp HD cath x few months. Hx taken during team round with RN, RT and other staff  VITAL  SIGNS: Temp 97.7 Pulse 64 RR 18 BP 98/72 Pulse 98%  PHYSICAL EXAMINATION: General:  Chronically ill appearing 49 year old female. Awake, NAD  Neuro: awake, follows commands, answers questions but slow to respond.  HEENT:  Trach midline, facial adiposity  Cardiovascular:  rrr Lungs:  resps even non labored on ATC with PMV Abdomen:  Obese, mid abd incision CD Musculoskeletal:  Flaccid  Skin:  Sacral wound not assessed.   PULMONARY No results for input(s): PHART, PCO2ART, PO2ART, HCO3, TCO2, O2SAT in the last 168 hours.  Invalid input(s): PCO2, PO2  CBC  Recent Labs Lab 06/22/15 0600 06/24/15 0635 06/27/15 0525  HGB 8.9* 9.2* 9.0*  HCT 27.5* 28.7* 28.4*  WBC 7.5 7.0 9.4  PLT 167 181 221    COAGULATION No results for input(s): INR in the last 168 hours.  CARDIAC  No results for input(s): TROPONINI in the last 168 hours. No results for input(s): PROBNP in the last 168 hours.   CHEMISTRY  Recent Labs Lab 06/22/15 0600 06/24/15 0500 06/27/15 0525  NA 132* 132* 133*  K 4.0 3.8 4.1  CL 98* 96* 97*  CO2 28 27 26   GLUCOSE 90 78 124*  BUN 47* 42* 53*  CREATININE 1.71* 1.80* 2.32*  CALCIUM 8.2* 8.3* 8.4*  PHOS 3.1 2.9 3.4   CrCl cannot be calculated (Unknown ideal weight.).   LIVER  Recent Labs Lab 06/22/15 0600 06/24/15 0500 06/27/15 0525  ALBUMIN 1.4* 1.3* 1.4*     INFECTIOUS No results for input(s): LATICACIDVEN, PROCALCITON in the last 168 hours.   ENDOCRINE CBG (last  3)  No results for input(s): GLUCAP in the last 72 hours.       IMAGING x48h  - image(s) personally visualized  -   highlighted in bold No results found.      ASSESSMENT / PLAN: PRIMARYU  Acute on Chronic hypercarbic respiratory failure w/ ventilator (baseline OSA) dependence Tracheostomy status  OTHER ISSUES   - Stage IV sacral wound w/ h/o treated osteo and bacteremia  Severe deconditioning - poor motivation to participate Severe PCM C diff colitis  (active) DVT H/o adrenal insuff DM Anemia of critical illness  Discussion  Plan Cont ATC daytime with qhs vent as tolerated PMV as tolerated Ongoing speech rx for phonation and PO intake  Poor candidate for decannulation in near future  Avoid sedation  Cont nutritional support Oral vanc for CDiff  Per primary team Cont PT efforts as MS will allow Cont wound care per WON      Dr. Kalman Shan, M.D., [facility].C.P Pulmonary and Critical Care Medicine Staff Physician [facility] Pager: [phone], If no answer or between  15:00h - 7:00h: call [phone]  06/27/2015 10:29 AM

## 2015-06-29 LAB — RENAL FUNCTION PANEL
Albumin: 1.3 g/dL — ABNORMAL LOW (ref 3.5–5.0)
Anion gap: 10 (ref 5–15)
BUN: 50 mg/dL — AB (ref 6–20)
CALCIUM: 8.1 mg/dL — AB (ref 8.9–10.3)
CO2: 26 mmol/L (ref 22–32)
CREATININE: 2.01 mg/dL — AB (ref 0.44–1.00)
Chloride: 100 mmol/L — ABNORMAL LOW (ref 101–111)
GFR calc Af Amer: 32 mL/min — ABNORMAL LOW (ref >60)
GFR calc non Af Amer: 28 mL/min — ABNORMAL LOW (ref >60)
GLUCOSE: 80 mg/dL (ref 65–99)
Phosphorus: 3.3 mg/dL (ref 2.5–4.6)
Potassium: 3.6 mmol/L (ref 3.5–5.1)
Sodium: 136 mmol/L (ref 135–145)

## 2015-06-29 LAB — CBC
HCT: 27.3 % — ABNORMAL LOW (ref 36.0–46.0)
HEMOGLOBIN: 8.7 g/dL — AB (ref 12.0–15.0)
MCH: 30.6 pg (ref 26.0–34.0)
MCHC: 31.9 g/dL (ref 30.0–36.0)
MCV: 96.1 fL (ref 78.0–100.0)
Platelets: 224 10*3/uL (ref 150–400)
RBC: 2.84 MIL/uL — AB (ref 3.87–5.11)
RDW: 15 % (ref 11.5–15.5)
WBC: 7.8 10*3/uL (ref 4.0–10.5)

## 2015-06-29 NOTE — Progress Notes (Signed)
Subjective:   Patietn had hemodialysis earlier today. UF of 1.5 kg.     Objective:  Vital signs in last 24 hours:  97.3 76 20 157/80    Physical Exam: General: NAD laying in bed  HEENT NGT, anicteric  Neck Supple trach in place and capped   Pulm/lungs Coarse b/l , normal effort  CVS/Heart Regular, no rub  Abdomen:  Soft, non tender, BS present  Extremities: 2+ b/l Le edema  Neurologic: Awake, alert, follows simple commands  Skin: Warm, no acute rashes  Access: Rt IJ PC       Basic Metabolic Panel:  Recent Labs Lab 06/24/15 0500 06/27/15 0525 06/29/15 0708  NA 132* 133* 136  K 3.8 4.1 3.6  CL 96* 97* 100*  CO2 27 26 26   GLUCOSE 78 124* 80  BUN 42* 53* 50*  CREATININE 1.80* 2.32* 2.01*  CALCIUM 8.3* 8.4* 8.1*  PHOS 2.9 3.4 3.3     CBC:  Recent Labs Lab 06/24/15 0635 06/27/15 0525 06/29/15 0708  WBC 7.0 9.4 7.8  HGB 9.2* 9.0* 8.7*  HCT 28.7* 28.4* 27.3*  MCV 96.6 96.9 96.1  PLT 181 221 224      Microbiology: Results for orders placed or performed during the hospital encounter of 06/15/15  Culture, respiratory (NON-Expectorated)     Status: None   Collection Time: 06/16/15  3:15 PM  Result Value Ref Range Status   Specimen Description TRACHEAL ASPIRATE  Final   Special Requests NONE  Final   Gram Stain   Final    ABUNDANT WBC PRESENT,BOTH PMN AND MONONUCLEAR RARE SQUAMOUS EPITHELIAL CELLS PRESENT NO ORGANISMS SEEN Performed at [facility]    Culture   Final    FEW PSEUDOMONAS AERUGINOSA FEW STENOTROPHOMONAS MALTOPHILIA Performed at [facility]    Report Status 06/20/2015 FINAL  Final   Organism ID, Bacteria PSEUDOMONAS AERUGINOSA  Final   Organism ID, Bacteria STENOTROPHOMONAS MALTOPHILIA  Final      Susceptibility   Pseudomonas aeruginosa - MIC*    CEFEPIME 8 SENSITIVE Sensitive     CIPROFLOXACIN >=4 RESISTANT Resistant     GENTAMICIN 8 INTERMEDIATE Intermediate     IMIPENEM 2 SENSITIVE Sensitive     TOBRAMYCIN  <=1 SENSITIVE Sensitive     * FEW PSEUDOMONAS AERUGINOSA   Stenotrophomonas maltophilia - MIC*    TRIMETH/SULFA <=20 SENSITIVE Sensitive     LEVOFLOXACIN 1 SENSITIVE Sensitive     * FEW STENOTROPHOMONAS MALTOPHILIA  Culture, blood (routine x 2)     Status: None   Collection Time: 06/16/15  3:30 PM  Result Value Ref Range Status   Specimen Description BLOOD RIGHT HAND  Final   Special Requests IN PEDIATRIC BOTTLE 3CC  Final   Culture NO GROWTH 5 DAYS  Final   Report Status 06/21/2015 FINAL  Final  Culture, blood (routine x 2)     Status: None   Collection Time: 06/16/15  3:40 PM  Result Value Ref Range Status   Specimen Description BLOOD LEFT HAND  Final   Special Requests IN PEDIATRIC BOTTLE 3CC  Final   Culture NO GROWTH 5 DAYS  Final   Report Status 06/21/2015 FINAL  Final    Coagulation Studies: No results for input(s): LABPROT, INR in the last 72 hours.  Urinalysis: No results for input(s): COLORURINE, LABSPEC, PHURINE, GLUCOSEU, HGBUR, BILIRUBINUR, KETONESUR, PROTEINUR, UROBILINOGEN, NITRITE, LEUKOCYTESUR in the last 72 hours.  Invalid input(s): APPERANCEUR    Imaging: No results found.   Medications:  Aranesp PO vanco   Assessment/ Plan:  49 y.o. 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  1. End-stage renal disease on hemodialysis. The patient has been on dialysis since October of 2014. -  Tolerated hemodialysis well. Continue MWF schedule.   2. Anemia of CKD:  hgb 8.7, continue aranesp [place] weekly.  3. Secondary hyperparathyroidism.  -  Phos 3.3, continue to monitor.   4. Hypertension: well controlled on carvedilol.   5. Chronic respiratory failure. Seems to be tolerating trach well at this time.     [place]ris [place], Threasa Heads 8/10/20163:59 PM

## 2015-06-30 ENCOUNTER — Other Ambulatory Visit (HOSPITAL_COMMUNITY): Payer: Self-pay

## 2015-06-30 LAB — URINALYSIS, ROUTINE W REFLEX MICROSCOPIC
Bilirubin Urine: NEGATIVE
Glucose, UA: NEGATIVE mg/dL
Ketones, ur: NEGATIVE mg/dL
Nitrite: POSITIVE — AB
PROTEIN: 100 mg/dL — AB
Specific Gravity, Urine: 1.01 (ref 1.005–1.030)
Urobilinogen, UA: 0.2 mg/dL (ref 0.0–1.0)
pH: 7.5 (ref 5.0–8.0)

## 2015-06-30 LAB — URINE MICROSCOPIC-ADD ON

## 2015-06-30 LAB — GRAM STAIN

## 2015-07-01 LAB — CBC
HEMATOCRIT: 26.9 % — AB (ref 36.0–46.0)
Hemoglobin: 8.5 g/dL — ABNORMAL LOW (ref 12.0–15.0)
MCH: 31.6 pg (ref 26.0–34.0)
MCHC: 31.6 g/dL (ref 30.0–36.0)
MCV: 100 fL (ref 78.0–100.0)
Platelets: 185 10*3/uL (ref 150–400)
RBC: 2.69 MIL/uL — AB (ref 3.87–5.11)
RDW: 15.2 % (ref 11.5–15.5)
WBC: 10.6 10*3/uL — AB (ref 4.0–10.5)

## 2015-07-01 LAB — RENAL FUNCTION PANEL
Albumin: 1.2 g/dL — ABNORMAL LOW (ref 3.5–5.0)
Anion gap: 8 (ref 5–15)
BUN: 46 mg/dL — ABNORMAL HIGH (ref 6–20)
CALCIUM: 8 mg/dL — AB (ref 8.9–10.3)
CHLORIDE: 101 mmol/L (ref 101–111)
CO2: 29 mmol/L (ref 22–32)
Creatinine, Ser: 1.87 mg/dL — ABNORMAL HIGH (ref 0.44–1.00)
GFR calc Af Amer: 35 mL/min — ABNORMAL LOW (ref >60)
GFR calc non Af Amer: 30 mL/min — ABNORMAL LOW (ref >60)
Glucose, Bld: 97 mg/dL (ref 65–99)
Phosphorus: 2.9 mg/dL (ref 2.5–4.6)
Potassium: 3.4 mmol/L — ABNORMAL LOW (ref 3.5–5.1)
Sodium: 138 mmol/L (ref 135–145)

## 2015-07-01 NOTE — Progress Notes (Signed)
Subjective:  Pt due for HD today. Resting in bed at the moment. Discussed with nursing.   No significant participation with PT.    Objective:  Vital signs in last 24 hours:  97.6 77 16 154/96    Physical Exam: General: NAD laying in bed  HEENT NGT, anicteric, OM moist  Neck Supple trach in place and capped   Pulm/lungs Coarse b/l , normal effort  CVS/Heart Regular, no rub  Abdomen:  Soft, non tender, BS present  Extremities: 1+ b/l LE edema  Neurologic: Awake, alert, follows simple commands  Skin: Warm, no acute rashes  Access: Rt IJ PC       Basic Metabolic Panel:  Recent Labs Lab 06/27/15 0525 06/29/15 0708 07/01/15 0703  NA 133* 136 138  K 4.1 3.6 3.4*  CL 97* 100* 101  CO2 GLUCOSE 124* 80 97  BUN 53* 50* 46*  CREATININE 2.32* 2.01* 1.87*  CALCIUM 8.4* 8.1* 8.0*  PHOS 3.4 3.3 2.9     CBC:  Recent Labs Lab 06/27/15 0525 06/29/15 0708 07/01/15 0703  WBC 9.4 7.8 10.6*  HGB 9.0* 8.7* 8.5*  HCT 28.4* 27.3* 26.9*  MCV 96.9 96.1 100.0  PLT 221 224 185      Microbiology: Results for orders placed or performed during the hospital encounter of 06/15/15  Culture, respiratory (NON-Expectorated)     Status: None   Collection Time: 06/16/15  3:15 PM  Result Value Ref Range Status   Specimen Description TRACHEAL ASPIRATE  Final   Special Requests NONE  Final   Gram Stain   Final    ABUNDANT WBC PRESENT,BOTH PMN AND MONONUCLEAR RARE SQUAMOUS EPITHELIAL CELLS PRESENT NO ORGANISMS SEEN Performed at [facility]    Culture   Final    FEW PSEUDOMONAS AERUGINOSA FEW STENOTROPHOMONAS MALTOPHILIA Performed at [facility]    Report Status 06/20/2015 FINAL  Final   Organism ID, Bacteria PSEUDOMONAS AERUGINOSA  Final   Organism ID, Bacteria STENOTROPHOMONAS MALTOPHILIA  Final      Susceptibility   Pseudomonas aeruginosa - MIC*    CEFEPIME 8 SENSITIVE Sensitive     CIPROFLOXACIN >=4 RESISTANT Resistant     GENTAMICIN 8  INTERMEDIATE Intermediate     IMIPENEM 2 SENSITIVE Sensitive     TOBRAMYCIN <=1 SENSITIVE Sensitive     * FEW PSEUDOMONAS AERUGINOSA   Stenotrophomonas maltophilia - MIC*    TRIMETH/SULFA <=20 SENSITIVE Sensitive     LEVOFLOXACIN 1 SENSITIVE Sensitive     * FEW STENOTROPHOMONAS MALTOPHILIA  Culture, blood (routine x 2)     Status: None   Collection Time: 06/16/15  3:30 PM  Result Value Ref Range Status   Specimen Description BLOOD RIGHT HAND  Final   Special Requests IN PEDIATRIC BOTTLE 3CC  Final   Culture NO GROWTH 5 DAYS  Final   Report Status 06/21/2015 FINAL  Final  Culture, blood (routine x 2)     Status: None   Collection Time: 06/16/15  3:40 PM  Result Value Ref Range Status   Specimen Description BLOOD LEFT HAND  Final   Special Requests IN PEDIATRIC BOTTLE 3CC  Final   Culture NO GROWTH 5 DAYS  Final   Report Status 06/21/2015 FINAL  Final  Stat Gram stain     Status: None   Collection Time: 06/30/15  6:37 PM  Result Value Ref Range Status   Specimen Description URINE, RANDOM  Final   Special Requests NONE  Final  Gram Stain   Final    WBC PRESENT,BOTH PMN AND MONONUCLEAR GRAM NEGATIVE RODS CYTOSPIN Gram Stain Report Called to,Read Back By and Verified With: B OLOFINTUYI RN [id number] 06/30/15 A BROWNING    Report Status 06/30/2015 FINAL  Final    Coagulation Studies: No results for input(s): LABPROT, INR in the last 72 hours.  Urinalysis:  Recent Labs  06/30/15 1410  COLORURINE BROWN*  LABSPEC 1.010  PHURINE 7.5  GLUCOSEU NEGATIVE  HGBUR MODERATE*  BILIRUBINUR NEGATIVE  KETONESUR NEGATIVE  PROTEINUR 100*  UROBILINOGEN 0.2  NITRITE POSITIVE*  LEUKOCYTESUR LARGE*      Imaging: Dg Chest Port 1 View  06/30/2015   CLINICAL DATA:  Followup respiratory failure.  EXAM: PORTABLE CHEST - 1 VIEW  COMPARISON:  06/15/2015  FINDINGS: Tracheostomy tube remains well-positioned.  Right internal jugular dual-lumen tunneled central venous catheter is unchanged with its  distal port projecting in the right atrium.  Left internal jugular small caliber central venous catheter has its tip in the lower superior vena cava.  Orogastric tube passes below the diaphragm into the distal stomach.  Lung volumes are relatively low. There is lung base opacity consistent with atelectasis possibly with small effusions. There is no convincing pneumonia or pulmonary edema. No pneumothorax.  Cardiac silhouette is mildly enlarged.  IMPRESSION: 1. No acute findings in the lungs. No convincing pulmonary edema or pneumonia. Mild lung base atelectasis and probable small effusions. 2. Support apparatus is stable, unchanged from prior exam.   Electronically Signed   By: Amie Portland M.D.   On: 06/30/2015 15:08     Medications:   Aranesp PO vanco   Assessment/ Plan:  49 y.o. 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  1. End-stage renal disease on hemodialysis. The patient has been on dialysis since October of 2014. - Pt due for HD today.  Orders have been prepared.  Will continue HD on MWF schedule, will also prepare orders for MOnday.  2. Anemia of CKD:  hgb 8.5 today, continue aranesp [place] weekly.  3. Secondary hyperparathyroidism.  -  Phos 2.9 and acceptable, will monitor.   4. Hypertension: BP higher this AM, would reassess after HD with ultrafiltration.  5. Chronic respiratory failure. Trach remains  capped, pt on T piece this AM.      Blondie Riggsbee 8/12/20168:04 AM

## 2015-07-04 DIAGNOSIS — J9620 Acute and chronic respiratory failure, unspecified whether with hypoxia or hypercapnia: Secondary | ICD-10-CM | POA: Diagnosis not present

## 2015-07-04 LAB — CBC
HEMATOCRIT: 27.1 % — AB (ref 36.0–46.0)
Hemoglobin: 8.3 g/dL — ABNORMAL LOW (ref 12.0–15.0)
MCH: 30.1 pg (ref 26.0–34.0)
MCHC: 30.6 g/dL (ref 30.0–36.0)
MCV: 98.2 fL (ref 78.0–100.0)
Platelets: 217 10*3/uL (ref 150–400)
RBC: 2.76 MIL/uL — ABNORMAL LOW (ref 3.87–5.11)
RDW: 14.9 % (ref 11.5–15.5)
WBC: 8.8 10*3/uL (ref 4.0–10.5)

## 2015-07-04 LAB — RENAL FUNCTION PANEL
ALBUMIN: 1.4 g/dL — AB (ref 3.5–5.0)
ANION GAP: 10 (ref 5–15)
Albumin: 1.4 g/dL — ABNORMAL LOW (ref 3.5–5.0)
Anion gap: 10 (ref 5–15)
BUN: 39 mg/dL — ABNORMAL HIGH (ref 6–20)
BUN: 40 mg/dL — ABNORMAL HIGH (ref 6–20)
CALCIUM: 8.6 mg/dL — AB (ref 8.9–10.3)
CHLORIDE: 102 mmol/L (ref 101–111)
CO2: 27 mmol/L (ref 22–32)
CO2: 27 mmol/L (ref 22–32)
Calcium: 8.5 mg/dL — ABNORMAL LOW (ref 8.9–10.3)
Chloride: 102 mmol/L (ref 101–111)
Creatinine, Ser: 2.14 mg/dL — ABNORMAL HIGH (ref 0.44–1.00)
Creatinine, Ser: 2.18 mg/dL — ABNORMAL HIGH (ref 0.44–1.00)
GFR calc Af Amer: 29 mL/min — ABNORMAL LOW (ref >60)
GFR calc Af Amer: 30 mL/min — ABNORMAL LOW (ref >60)
GFR calc non Af Amer: 26 mL/min — ABNORMAL LOW (ref >60)
GFR, EST NON AFRICAN AMERICAN: 25 mL/min — AB (ref >60)
Glucose, Bld: 108 mg/dL — ABNORMAL HIGH (ref 65–99)
Glucose, Bld: 109 mg/dL — ABNORMAL HIGH (ref 65–99)
PHOSPHORUS: 2.7 mg/dL (ref 2.5–4.6)
Phosphorus: 2.6 mg/dL (ref 2.5–4.6)
Potassium: 4.4 mmol/L (ref 3.5–5.1)
Potassium: 4.4 mmol/L (ref 3.5–5.1)
SODIUM: 139 mmol/L (ref 135–145)
Sodium: 139 mmol/L (ref 135–145)

## 2015-07-04 NOTE — Progress Notes (Signed)
Subjective:  Pt seen during HD.  UF goal increased to 3.5 kg.  Dialysis being done in [place].    Objective:  Vital signs in last 24 hours:  98.2  72  169/86    Physical Exam: General: NAD   HEENT anicteric, OM moist  Neck Supple trach in place and capped   Pulm/lungs Coarse b/l , normal effort  CVS/Heart Regular, no rub  Abdomen:  Soft, non tender, BS present  Extremities: 2+ b/l LE edema  Neurologic: Awake, alert, follows simple commands  Skin: Warm, no acute rashes  Access: Rt IJ PC       Basic Metabolic Panel:  Recent Labs Lab 06/29/15 0708 07/01/15 0703 07/04/15 0623 07/04/15 0624  NA 136 138 139 139  K 3.6 3.4* 4.4 4.4  CL 100* 101 102 102  CO2 GLUCOSE 80 97 108* 109*  BUN 50* 46* 40* 39*  CREATININE 2.01* 1.87* 2.18* 2.14*  CALCIUM 8.1* 8.0* 8.5* 8.6*  PHOS 3.3 2.9 2.6 2.7     CBC:  Recent Labs Lab 06/29/15 0708 07/01/15 0703 07/04/15 0624  WBC 7.8 10.6* 8.8  HGB 8.7* 8.5* 8.3*  HCT 27.3* 26.9* 27.1*  MCV 96.1 100.0 98.2  PLT 224 185 217      Microbiology: Results for orders placed or performed during the hospital encounter of 06/15/15  Culture, respiratory (NON-Expectorated)     Status: None   Collection Time: 06/16/15  3:15 PM  Result Value Ref Range Status   Specimen Description TRACHEAL ASPIRATE  Final   Special Requests NONE  Final   Gram Stain   Final    ABUNDANT WBC PRESENT,BOTH PMN AND MONONUCLEAR RARE SQUAMOUS EPITHELIAL CELLS PRESENT NO ORGANISMS SEEN Performed at [facility]    Culture   Final    FEW PSEUDOMONAS AERUGINOSA FEW STENOTROPHOMONAS MALTOPHILIA Performed at [facility]    Report Status 06/20/2015 FINAL  Final   Organism ID, Bacteria PSEUDOMONAS AERUGINOSA  Final   Organism ID, Bacteria STENOTROPHOMONAS MALTOPHILIA  Final      Susceptibility   Pseudomonas aeruginosa - MIC*    CEFEPIME 8 SENSITIVE Sensitive     CIPROFLOXACIN >=4 RESISTANT Resistant     GENTAMICIN 8  INTERMEDIATE Intermediate     IMIPENEM 2 SENSITIVE Sensitive     TOBRAMYCIN <=1 SENSITIVE Sensitive     * FEW PSEUDOMONAS AERUGINOSA   Stenotrophomonas maltophilia - MIC*    TRIMETH/SULFA <=20 SENSITIVE Sensitive     LEVOFLOXACIN 1 SENSITIVE Sensitive     * FEW STENOTROPHOMONAS MALTOPHILIA  Culture, blood (routine x 2)     Status: None   Collection Time: 06/16/15  3:30 PM  Result Value Ref Range Status   Specimen Description BLOOD RIGHT HAND  Final   Special Requests IN PEDIATRIC BOTTLE 3CC  Final   Culture NO GROWTH 5 DAYS  Final   Report Status 06/21/2015 FINAL  Final  Culture, blood (routine x 2)     Status: None   Collection Time: 06/16/15  3:40 PM  Result Value Ref Range Status   Specimen Description BLOOD LEFT HAND  Final   Special Requests IN PEDIATRIC BOTTLE 3CC  Final   Culture NO GROWTH 5 DAYS  Final   Report Status 06/21/2015 FINAL  Final  Stat Gram stain     Status: None   Collection Time: 06/30/15  6:37 PM  Result Value Ref Range Status   Specimen Description URINE, RANDOM  Final   Special Requests  NONE  Final   Gram Stain   Final    WBC PRESENT,BOTH PMN AND MONONUCLEAR GRAM NEGATIVE RODS CYTOSPIN Gram Stain Report Called to,Read Back By and Verified With: B OLOFINTUYI RN 1739 06/30/15 A BROWNING    Report Status 06/30/2015 FINAL  Final    Coagulation Studies: No results for input(s): LABPROT, INR in the last 72 hours.  Urinalysis: No results for input(s): COLORURINE, LABSPEC, PHURINE, GLUCOSEU, HGBUR, BILIRUBINUR, KETONESUR, PROTEINUR, UROBILINOGEN, NITRITE, LEUKOCYTESUR in the last 72 hours.  Invalid input(s): APPERANCEUR    Imaging: No results found.   Medications:   Aranesp PO vanco   Assessment/ Plan:  49 y.o. 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  1. End-stage renal disease on hemodialysis. The patient has been on dialysis since October of 2014. - Patient seen during dialysis Tolerating well  - next treatment wednesday. - UF as tolerated  2. Anemia of CKD:  hgb 8.3 today, continue aranesp [place] weekly.  3. Secondary hyperparathyroidism.  -  Phos 2.7 and acceptable, will monitor.   4. Chronic respiratory failure. Trach remains capped,        Poetry Cerro 8/15/20162:30 PM

## 2015-07-04 NOTE — Progress Notes (Signed)
Name: Courtney [name] MRN: [id number] DOB: 19-Oct-1966    ADMISSION DATE:  06/15/2015 CONSULTATION DATE:  7/28   REFERRING MD :  Sharyon Medicus   CHIEF COMPLAINT:  Vent weaning/trach care  BRIEF PATIENT DESCRIPTION:   49 year old female s/p prolonged critical illness initially a consequence of gastric bypass w/ gastric sleeve c/b several abd abscess, abd fistula and recurrent infection (mrsa bacteremia, mrsa osteo, pseudomonas and CRE UTI). Re-admitted to [facility] Jan 2016 due to stage IV sacral wound/osteo and bacteremia. Had made progress over the last 7 months to the point that she was actually tolerating GI diet, nocturnal ventilation, ATC during day and progressing slowly from a PT stand-point. PCCM was asked to see for trach/vent assist.    SIGNIFICANT EVENTS    SUBJECTIVE/OVERNIGHT/INTERVAL HX No acute change.  ATC daytime with qhs vent (her baseline).  No c/o.    VITAL SIGNS: Reviewed at bedside, abnormal values discussed in Imp/Plan.   PHYSICAL EXAMINATION: General:  Chronically ill appearing 49 year old female. Awake, NAD  Neuro: awake, follows commands, answers questions but slow to respond.  HEENT:  Trach midline, facial adiposity  Cardiovascular:  rrr Lungs:  resps even non labored on ATC with PMV, few clear secretions  Abdomen:  Obese, non tender  Musculoskeletal:  Flaccid    PULMONARY No results for input(s): PHART, PCO2ART, PO2ART, HCO3, TCO2, O2SAT in the last 168 hours.  Invalid input(s): PCO2, PO2  CBC  Recent Labs Lab 06/29/15 0708 07/01/15 0703 07/04/15 0624  HGB 8.7* 8.5* 8.3*  HCT 27.3* 26.9* 27.1*  WBC 7.8 10.6* 8.8  PLT 224 185 217    COAGULATION No results for input(s): INR in the last 168 hours.  CARDIAC  No results for input(s): TROPONINI in the last 168 hours. No results for input(s): PROBNP in the last 168 hours.   CHEMISTRY  Recent Labs Lab 06/29/15 0708 07/01/15 0703 07/04/15 0623 07/04/15 0624  NA 136 138 139 139  K  3.6 3.4* 4.4 4.4  CL 100* 101 102 102  CO2 26 29 27 27   GLUCOSE 80 97 108* 109*  BUN 50* 46* 40* 39*  CREATININE 2.01* 1.87* 2.18* 2.14*  CALCIUM 8.1* 8.0* 8.5* 8.6*  PHOS 3.3 2.9 2.6 2.7   CrCl cannot be calculated (Unknown ideal weight.).   LIVER  Recent Labs Lab 06/29/15 0708 07/01/15 0703 07/04/15 0623 07/04/15 0624  ALBUMIN 1.3* 1.2* 1.4* 1.4*     INFECTIOUS No results for input(s): LATICACIDVEN, PROCALCITON in the last 168 hours.   ENDOCRINE CBG (last 3)  No results for input(s): GLUCAP in the last 72 hours.    IMAGING x48h  No results found.    ASSESSMENT / PLAN:  Acute on Chronic hypercarbic respiratory failure w/ ventilator dependence  OSA Tracheostomy status  Plan Cont ATC daytime with qhs vent as tolerated  -- this is her baseline PMV as tolerated Ongoing speech rx for phonation and PO intake  Poor candidate for decannulation  Avoid sedation  Cont nutritional support Cont PT efforts as MS will allow Cont wound care per WON    OTHER ISSUES per primary   - Stage IV sacral wound w/ h/o treated osteo and bacteremia  Severe deconditioning - poor motivation to participate Severe PCM C diff colitis (active) DVT H/o adrenal insuff DM Anemia of critical illness   Patient at baseline daytime ATC with qhs vent.  Not candidate for decannulation.   PCCM will be available PRN. Please call if needed.  Dirk Dress, NP 07/04/2015  11:08 AM Pager: [phone] or [phone]

## 2015-07-06 LAB — CBC
HCT: 27.3 % — ABNORMAL LOW (ref 36.0–46.0)
HEMOGLOBIN: 8.7 g/dL — AB (ref 12.0–15.0)
MCH: 30.6 pg (ref 26.0–34.0)
MCHC: 31.9 g/dL (ref 30.0–36.0)
MCV: 96.1 fL (ref 78.0–100.0)
Platelets: 225 10*3/uL (ref 150–400)
RBC: 2.84 MIL/uL — AB (ref 3.87–5.11)
RDW: 14.4 % (ref 11.5–15.5)
WBC: 8.5 10*3/uL (ref 4.0–10.5)

## 2015-07-06 LAB — RENAL FUNCTION PANEL
ALBUMIN: 1.5 g/dL — AB (ref 3.5–5.0)
ANION GAP: 8 (ref 5–15)
BUN: 41 mg/dL — ABNORMAL HIGH (ref 6–20)
CALCIUM: 8.5 mg/dL — AB (ref 8.9–10.3)
CO2: 28 mmol/L (ref 22–32)
Chloride: 104 mmol/L (ref 101–111)
Creatinine, Ser: 1.97 mg/dL — ABNORMAL HIGH (ref 0.44–1.00)
GFR calc non Af Amer: 28 mL/min — ABNORMAL LOW (ref >60)
GFR, EST AFRICAN AMERICAN: 33 mL/min — AB (ref >60)
Glucose, Bld: 95 mg/dL (ref 65–99)
Phosphorus: 3.1 mg/dL (ref 2.5–4.6)
Potassium: 4.1 mmol/L (ref 3.5–5.1)
SODIUM: 140 mmol/L (ref 135–145)

## 2015-07-06 NOTE — Progress Notes (Signed)
Subjective:  Pt seen during HD.  UF goal being varied as BP is fluctuating.  Dialysis being done in chair.    Objective:  Vital signs in last 24 hours:  106/36  67      Physical Exam: General: NAD   HEENT anicteric, OM moist  Neck Supple trach in place and capped   Pulm/lungs Coarse b/l , normal effort  CVS/Heart Regular, no rub  Abdomen:  Soft, non tender, BS present  Extremities: 2+ b/l LE edema  Neurologic: Resting quietly  Skin: Warm, no acute rashes  Access: Rt IJ PC       Basic Metabolic Panel:  Recent Labs Lab 07/01/15 0703 07/04/15 0623 07/04/15 0624 07/06/15 0624  NA 138 139 139 140  K 3.4* 4.4 4.4 4.1  CL 101 102 102 104  CO2 29 27 27 28   GLUCOSE 97 108* 109* 95  BUN 46* 40* 39* 41*  CREATININE 1.87* 2.18* 2.14* 1.97*  CALCIUM 8.0* 8.5* 8.6* 8.5*  PHOS 2.9 2.6 2.7 3.1     CBC:  Recent Labs Lab 07/01/15 0703 07/04/15 0624 07/06/15 0624  WBC 10.6* 8.8 8.5  HGB 8.5* 8.3* 8.7*  HCT 26.9* 27.1* 27.3*  MCV 100.0 98.2 96.1  PLT 185 217 225      Microbiology: Results for orders placed or performed during the hospital encounter of 06/15/15  Culture, respiratory (NON-Expectorated)     Status: None   Collection Time: 06/16/15  3:15 PM  Result Value Ref Range Status   Specimen Description TRACHEAL ASPIRATE  Final   Special Requests NONE  Final   Gram Stain   Final    ABUNDANT WBC PRESENT,BOTH PMN AND MONONUCLEAR RARE SQUAMOUS EPITHELIAL CELLS PRESENT NO ORGANISMS SEEN Performed at [facility]    Culture   Final    FEW PSEUDOMONAS AERUGINOSA FEW STENOTROPHOMONAS MALTOPHILIA Performed at [facility]    Report Status 06/20/2015 FINAL  Final   Organism ID, Bacteria PSEUDOMONAS AERUGINOSA  Final   Organism ID, Bacteria STENOTROPHOMONAS MALTOPHILIA  Final      Susceptibility   Pseudomonas aeruginosa - MIC*    CEFEPIME 8 SENSITIVE Sensitive     CIPROFLOXACIN >=4 RESISTANT Resistant     GENTAMICIN 8 INTERMEDIATE  Intermediate     IMIPENEM 2 SENSITIVE Sensitive     TOBRAMYCIN <=1 SENSITIVE Sensitive     * FEW PSEUDOMONAS AERUGINOSA   Stenotrophomonas maltophilia - MIC*    TRIMETH/SULFA <=20 SENSITIVE Sensitive     LEVOFLOXACIN 1 SENSITIVE Sensitive     * FEW STENOTROPHOMONAS MALTOPHILIA  Culture, blood (routine x 2)     Status: None   Collection Time: 06/16/15  3:30 PM  Result Value Ref Range Status   Specimen Description BLOOD RIGHT HAND  Final   Special Requests IN PEDIATRIC BOTTLE 3CC  Final   Culture NO GROWTH 5 DAYS  Final   Report Status 06/21/2015 FINAL  Final  Culture, blood (routine x 2)     Status: None   Collection Time: 06/16/15  3:40 PM  Result Value Ref Range Status   Specimen Description BLOOD LEFT HAND  Final   Special Requests IN PEDIATRIC BOTTLE 3CC  Final   Culture NO GROWTH 5 DAYS  Final   Report Status 06/21/2015 FINAL  Final  Stat Gram stain     Status: None   Collection Time: 06/30/15  6:37 PM  Result Value Ref Range Status   Specimen Description URINE, RANDOM  Final   Special Requests NONE  Final   Gram Stain   Final    WBC PRESENT,BOTH PMN AND MONONUCLEAR GRAM NEGATIVE RODS CYTOSPIN Gram Stain Report Called to,Read Back By and Verified With: B OLOFINTUYI RN [id number] 06/30/15 A BROWNING    Report Status 06/30/2015 FINAL  Final    Coagulation Studies: No results for input(s): LABPROT, INR in the last 72 hours.  Urinalysis: No results for input(s): COLORURINE, LABSPEC, PHURINE, GLUCOSEU, HGBUR, BILIRUBINUR, KETONESUR, PROTEINUR, UROBILINOGEN, NITRITE, LEUKOCYTESUR in the last 72 hours.  Invalid input(s): APPERANCEUR    Imaging: No results found.   Medications:   Aranesp   Assessment/ Plan:  49 y.o. 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  1. End-stage renal disease on hemodialysis. The patient has been on dialysis since October of 2014. - Patient seen during dialysis Tolerating well  - next treatment Friday - UF as tolerated  2. Anemia of CKD:  hgb 8.7 today, continue aranesp [place] weekly.  3. Secondary hyperparathyroidism.  -  Phos 3.1 and acceptable, will monitor.   4. Chronic respiratory failure. Trach remains capped,        Mosetta Pigeon 8/17/20163:16 PM

## 2015-07-08 LAB — CBC
HCT: 27.2 % — ABNORMAL LOW (ref 36.0–46.0)
HEMOGLOBIN: 8.6 g/dL — AB (ref 12.0–15.0)
MCH: 30.7 pg (ref 26.0–34.0)
MCHC: 31.6 g/dL (ref 30.0–36.0)
MCV: 97.1 fL (ref 78.0–100.0)
Platelets: 238 10*3/uL (ref 150–400)
RBC: 2.8 MIL/uL — AB (ref 3.87–5.11)
RDW: 14.6 % (ref 11.5–15.5)
WBC: 7.9 10*3/uL (ref 4.0–10.5)

## 2015-07-08 LAB — RENAL FUNCTION PANEL
ANION GAP: 9 (ref 5–15)
Albumin: 1.6 g/dL — ABNORMAL LOW (ref 3.5–5.0)
BUN: 34 mg/dL — ABNORMAL HIGH (ref 6–20)
CALCIUM: 8.2 mg/dL — AB (ref 8.9–10.3)
CO2: 27 mmol/L (ref 22–32)
Chloride: 104 mmol/L (ref 101–111)
Creatinine, Ser: 2.12 mg/dL — ABNORMAL HIGH (ref 0.44–1.00)
GFR calc non Af Amer: 26 mL/min — ABNORMAL LOW (ref >60)
GFR, EST AFRICAN AMERICAN: 30 mL/min — AB (ref >60)
Glucose, Bld: 92 mg/dL (ref 65–99)
PHOSPHORUS: 3.3 mg/dL (ref 2.5–4.6)
Potassium: 3.8 mmol/L (ref 3.5–5.1)
SODIUM: 140 mmol/L (ref 135–145)

## 2015-07-08 NOTE — Progress Notes (Signed)
Subjective:  Pt reports she is hungry this AM. Nurse notified of this On Ventilator this AM bp dropped towards the end of treatment on Wednesday requiring decrease in UF goal. 2.5 L was removed.    Objective:  Vital signs in last 24 hours:  97.7  59  16  120/80     Physical Exam: General: NAD   HEENT anicteric, OM moist  Neck Supple trach in place  Pulm/lungs Coarse b/l , normal effort, vent assisted this AM  CVS/Heart Regular, no rub  Abdomen:  Soft, non tender, BS present  Extremities: 2+ b/l LE edema  Neurologic: Resting quietly  Skin: Warm, no acute rashes  Access: Rt IJ PC       Basic Metabolic Panel:  Recent Labs Lab 07/04/15 0623 07/04/15 0624 07/06/15 0624 07/08/15 0530  NA 139 139 140 140  K 4.4 4.4 4.1 3.8  CL 102 102 104 104  CO2 GLUCOSE 108* 109* 95 92  BUN 40* 39* 41* 34*  CREATININE 2.18* 2.14* 1.97* 2.12*  CALCIUM 8.5* 8.6* 8.5* 8.2*  PHOS 2.6 2.7 3.1 3.3     CBC:  Recent Labs Lab 07/04/15 0624 07/06/15 0624 07/08/15 0530  WBC 8.8 8.5 7.9  HGB 8.3* 8.7* 8.6*  HCT 27.1* 27.3* 27.2*  MCV 98.2 96.1 97.1  PLT 217 225 238      Microbiology: Results for orders placed or performed during the hospital encounter of 06/15/15  Culture, respiratory (NON-Expectorated)     Status: None   Collection Time: 06/16/15  3:15 PM  Result Value Ref Range Status   Specimen Description TRACHEAL ASPIRATE  Final   Special Requests NONE  Final   Gram Stain   Final    ABUNDANT WBC PRESENT,BOTH PMN AND MONONUCLEAR RARE SQUAMOUS EPITHELIAL CELLS PRESENT NO ORGANISMS SEEN Performed at [facility]    Culture   Final    FEW PSEUDOMONAS AERUGINOSA FEW STENOTROPHOMONAS MALTOPHILIA Performed at [facility]    Report Status 06/20/2015 FINAL  Final   Organism ID, Bacteria PSEUDOMONAS AERUGINOSA  Final   Organism ID, Bacteria STENOTROPHOMONAS MALTOPHILIA  Final      Susceptibility   Pseudomonas aeruginosa - MIC*    CEFEPIME 8  SENSITIVE Sensitive     CIPROFLOXACIN >=4 RESISTANT Resistant     GENTAMICIN 8 INTERMEDIATE Intermediate     IMIPENEM 2 SENSITIVE Sensitive     TOBRAMYCIN <=1 SENSITIVE Sensitive     * FEW PSEUDOMONAS AERUGINOSA   Stenotrophomonas maltophilia - MIC*    TRIMETH/SULFA <=20 SENSITIVE Sensitive     LEVOFLOXACIN 1 SENSITIVE Sensitive     * FEW STENOTROPHOMONAS MALTOPHILIA  Culture, blood (routine x 2)     Status: None   Collection Time: 06/16/15  3:30 PM  Result Value Ref Range Status   Specimen Description BLOOD RIGHT HAND  Final   Special Requests IN PEDIATRIC BOTTLE 3CC  Final   Culture NO GROWTH 5 DAYS  Final   Report Status 06/21/2015 FINAL  Final  Culture, blood (routine x 2)     Status: None   Collection Time: 06/16/15  3:40 PM  Result Value Ref Range Status   Specimen Description BLOOD LEFT HAND  Final   Special Requests IN PEDIATRIC BOTTLE 3CC  Final   Culture NO GROWTH 5 DAYS  Final   Report Status 06/21/2015 FINAL  Final  Stat Gram stain     Status: None   Collection Time: 06/30/15  6:37 PM  Result Value Ref Range Status   Specimen Description URINE, RANDOM  Final   Special Requests NONE  Final   Gram Stain   Final    WBC PRESENT,BOTH PMN AND MONONUCLEAR GRAM NEGATIVE RODS CYTOSPIN Gram Stain Report Called to,Read Back By and Verified With: B OLOFINTUYI RN [id number] 06/30/15 A BROWNING    Report Status 06/30/2015 FINAL  Final    Coagulation Studies: No results for input(s): LABPROT, INR in the last 72 hours.  Urinalysis: No results for input(s): COLORURINE, LABSPEC, PHURINE, GLUCOSEU, HGBUR, BILIRUBINUR, KETONESUR, PROTEINUR, UROBILINOGEN, NITRITE, LEUKOCYTESUR in the last 72 hours.  Invalid input(s): APPERANCEUR    Imaging: No results found.   Medications:   Aranesp   Assessment/ Plan:  49 y.o. 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  1. End-stage renal disease on hemodialysis. The patient has been on dialysis since October of 2014. - UF as tolerated - patient is on Amlodipine, carvedilol, lisinopril - BP has trended low lately. Will d/c amlodipine.   2. Anemia of CKD:  hgb 8.6 today, continue aranesp [place] weekly.  3. Secondary hyperparathyroidism.  -  Phos 3.3 and acceptable, will monitor.   4. Chronic respiratory failure. Trach/Vent  - as per IM/Pulm team     [facility] 8/19/20168:49 AM

## 2015-07-11 LAB — URINALYSIS, ROUTINE W REFLEX MICROSCOPIC
Glucose, UA: NEGATIVE mg/dL
Ketones, ur: NEGATIVE mg/dL
NITRITE: POSITIVE — AB
PROTEIN: 100 mg/dL — AB
Specific Gravity, Urine: 1.025 (ref 1.005–1.030)
UROBILINOGEN UA: 1 mg/dL (ref 0.0–1.0)
pH: 7.5 (ref 5.0–8.0)

## 2015-07-11 LAB — BLOOD GAS, ARTERIAL
Acid-Base Excess: 1.9 mmol/L (ref 0.0–2.0)
Bicarbonate: 25.6 mEq/L — ABNORMAL HIGH (ref 20.0–24.0)
FIO2: 0.28
MECHVT: 500 mL
O2 SAT: 99.3 %
PATIENT TEMPERATURE: 98.6
PCO2 ART: 38.2 mmHg (ref 35.0–45.0)
PEEP: 5 cmH2O
PH ART: 7.442 (ref 7.350–7.450)
PO2 ART: 137 mmHg — AB (ref 80.0–100.0)
RATE: 16 resp/min
TCO2: 26.8 mmol/L (ref 0–100)

## 2015-07-11 LAB — RENAL FUNCTION PANEL
Albumin: 1.3 g/dL — ABNORMAL LOW (ref 3.5–5.0)
Anion gap: 5 (ref 5–15)
BUN: 31 mg/dL — AB (ref 6–20)
CHLORIDE: 108 mmol/L (ref 101–111)
CO2: 28 mmol/L (ref 22–32)
CREATININE: 2.65 mg/dL — AB (ref 0.44–1.00)
Calcium: 8.4 mg/dL — ABNORMAL LOW (ref 8.9–10.3)
GFR calc Af Amer: 23 mL/min — ABNORMAL LOW (ref >60)
GFR, EST NON AFRICAN AMERICAN: 20 mL/min — AB (ref >60)
GLUCOSE: 36 mg/dL — AB (ref 65–99)
Phosphorus: 2.8 mg/dL (ref 2.5–4.6)
Potassium: 4.6 mmol/L (ref 3.5–5.1)
Sodium: 141 mmol/L (ref 135–145)

## 2015-07-11 LAB — CBC
HCT: 25.8 % — ABNORMAL LOW (ref 36.0–46.0)
Hemoglobin: 8.1 g/dL — ABNORMAL LOW (ref 12.0–15.0)
MCH: 30.9 pg (ref 26.0–34.0)
MCHC: 31.4 g/dL (ref 30.0–36.0)
MCV: 98.5 fL (ref 78.0–100.0)
PLATELETS: 233 10*3/uL (ref 150–400)
RBC: 2.62 MIL/uL — ABNORMAL LOW (ref 3.87–5.11)
RDW: 15.5 % (ref 11.5–15.5)
WBC: 13.1 10*3/uL — AB (ref 4.0–10.5)

## 2015-07-11 LAB — BASIC METABOLIC PANEL
Anion gap: 9 (ref 5–15)
BUN: 31 mg/dL — AB (ref 6–20)
CALCIUM: 8.2 mg/dL — AB (ref 8.9–10.3)
CO2: 27 mmol/L (ref 22–32)
CREATININE: 2.64 mg/dL — AB (ref 0.44–1.00)
Chloride: 104 mmol/L (ref 101–111)
GFR calc non Af Amer: 20 mL/min — ABNORMAL LOW (ref >60)
GFR, EST AFRICAN AMERICAN: 23 mL/min — AB (ref >60)
Glucose, Bld: 34 mg/dL — CL (ref 65–99)
Potassium: 4.6 mmol/L (ref 3.5–5.1)
SODIUM: 140 mmol/L (ref 135–145)

## 2015-07-11 LAB — URINE MICROSCOPIC-ADD ON

## 2015-07-11 NOTE — Progress Notes (Signed)
Subjective:  Pt was to have HD today but was found to be hypotensive. Has been given fluid bolus and albumin.  Tried to check BP personally manually, hard to auscultate her arm given edema.   Objective:  Vital signs in last 24 hours:  Pulse 77 Resp: 18 O2: 98% BP: unable to auscultate.    Physical Exam: General: NAD   HEENT anicteric, OM moist  Neck Supple trach in place  Pulm/lungs Coarse b/l , normal effort  CVS/Heart Regular, no rub  Abdomen:  Soft, non tender, BS present  Extremities: 2+ b/l upper and lower extremity edema  Neurologic: Lethargic but arousable  Skin: Warm, no acute rashes  Access: Rt IJ PC       Basic Metabolic Panel:  Recent Labs Lab 07/06/15 0624 07/08/15 0530 07/11/15 0515 07/11/15 0550  NA 140 140 140 141  K 4.1 3.8 4.6 4.6  CL 104 104 104 108  CO2 GLUCOSE 95 92 34* 36*  BUN 41* 34* 31* 31*  CREATININE 1.97* 2.12* 2.64* 2.65*  CALCIUM 8.5* 8.2* 8.2* 8.4*  PHOS 3.1 3.3  --  2.8     CBC:  Recent Labs Lab 07/06/15 0624 07/08/15 0530 07/11/15 0515  WBC 8.5 7.9 13.1*  HGB 8.7* 8.6* 8.1*  HCT 27.3* 27.2* 25.8*  MCV 96.1 97.1 98.5  PLT 225 238 233      Microbiology: Results for orders placed or performed during the hospital encounter of 06/15/15  Culture, respiratory (NON-Expectorated)     Status: None   Collection Time: 06/16/15  3:15 PM  Result Value Ref Range Status   Specimen Description TRACHEAL ASPIRATE  Final   Special Requests NONE  Final   Gram Stain   Final    ABUNDANT WBC PRESENT,BOTH PMN AND MONONUCLEAR RARE SQUAMOUS EPITHELIAL CELLS PRESENT NO ORGANISMS SEEN Performed at [facility]    Culture   Final    FEW PSEUDOMONAS AERUGINOSA FEW STENOTROPHOMONAS MALTOPHILIA Performed at [facility]    Report Status 06/20/2015 FINAL  Final   Organism ID, Bacteria PSEUDOMONAS AERUGINOSA  Final   Organism ID, Bacteria STENOTROPHOMONAS MALTOPHILIA  Final      Susceptibility   Pseudomonas  aeruginosa - MIC*    CEFEPIME 8 SENSITIVE Sensitive     CIPROFLOXACIN >=4 RESISTANT Resistant     GENTAMICIN 8 INTERMEDIATE Intermediate     IMIPENEM 2 SENSITIVE Sensitive     TOBRAMYCIN <=1 SENSITIVE Sensitive     * FEW PSEUDOMONAS AERUGINOSA   Stenotrophomonas maltophilia - MIC*    TRIMETH/SULFA <=20 SENSITIVE Sensitive     LEVOFLOXACIN 1 SENSITIVE Sensitive     * FEW STENOTROPHOMONAS MALTOPHILIA  Culture, blood (routine x 2)     Status: None   Collection Time: 06/16/15  3:30 PM  Result Value Ref Range Status   Specimen Description BLOOD RIGHT HAND  Final   Special Requests IN PEDIATRIC BOTTLE 3CC  Final   Culture NO GROWTH 5 DAYS  Final   Report Status 06/21/2015 FINAL  Final  Culture, blood (routine x 2)     Status: None   Collection Time: 06/16/15  3:40 PM  Result Value Ref Range Status   Specimen Description BLOOD LEFT HAND  Final   Special Requests IN PEDIATRIC BOTTLE 3CC  Final   Culture NO GROWTH 5 DAYS  Final   Report Status 06/21/2015 FINAL  Final  Stat Gram stain     Status: None   Collection Time: 06/30/15  6:37 PM  Result Value Ref Range Status   Specimen Description URINE, RANDOM  Final   Special Requests NONE  Final   Gram Stain   Final    WBC PRESENT,BOTH PMN AND MONONUCLEAR GRAM NEGATIVE RODS CYTOSPIN Gram Stain Report Called to,Read Back By and Verified With: B OLOFINTUYI RN [id number] 06/30/15 A BROWNING    Report Status 06/30/2015 FINAL  Final    Coagulation Studies: No results for input(s): LABPROT, INR in the last 72 hours.  Urinalysis:  Recent Labs  07/11/15 1345  COLORURINE BROWN*  LABSPEC 1.025  PHURINE 7.5  GLUCOSEU NEGATIVE  HGBUR LARGE*  BILIRUBINUR SMALL*  KETONESUR NEGATIVE  PROTEINUR 100*  UROBILINOGEN 1.0  NITRITE POSITIVE*  LEUKOCYTESUR LARGE*      Imaging: No results found.   Medications:   Aranesp   Assessment/ Plan:  49 y.o. 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  1. End-stage renal disease on hemodialysis. The patient has been on dialysis since October of 2014. -Unable to obtain BP this PM, however pt is arousable and will follow simple commands.  Asked nursing to get doppler to see if BP could be determine this way.  Hold off on dialysis for now.   2. Anemia of CKD:  hgb last hgb 8.1, continue aranesp [employer] weekly.  3. Secondary hyperparathyroidism.  -  Phos 2.8 at last check adn acceptable.  4. Chronic respiratory failure. Trach/Vent  - breathing comfortably off the vent at the moment.       Quante Pettry, Rexene Edison 8/22/20163:34 PM

## 2015-07-12 LAB — URINE CULTURE

## 2015-07-13 LAB — CBC
HCT: 26 % — ABNORMAL LOW (ref 36.0–46.0)
Hemoglobin: 8.2 g/dL — ABNORMAL LOW (ref 12.0–15.0)
MCH: 30.7 pg (ref 26.0–34.0)
MCHC: 31.5 g/dL (ref 30.0–36.0)
MCV: 97.4 fL (ref 78.0–100.0)
PLATELETS: 253 10*3/uL (ref 150–400)
RBC: 2.67 MIL/uL — AB (ref 3.87–5.11)
RDW: 15.5 % (ref 11.5–15.5)
WBC: 9.5 10*3/uL (ref 4.0–10.5)

## 2015-07-13 LAB — RENAL FUNCTION PANEL
Albumin: 1.5 g/dL — ABNORMAL LOW (ref 3.5–5.0)
Anion gap: 8 (ref 5–15)
BUN: 40 mg/dL — ABNORMAL HIGH (ref 6–20)
CALCIUM: 8.4 mg/dL — AB (ref 8.9–10.3)
CO2: 25 mmol/L (ref 22–32)
CREATININE: 3.37 mg/dL — AB (ref 0.44–1.00)
Chloride: 109 mmol/L (ref 101–111)
GFR, EST AFRICAN AMERICAN: 17 mL/min — AB (ref >60)
GFR, EST NON AFRICAN AMERICAN: 15 mL/min — AB (ref >60)
Glucose, Bld: 84 mg/dL (ref 65–99)
PHOSPHORUS: 4.1 mg/dL (ref 2.5–4.6)
Potassium: 4.9 mmol/L (ref 3.5–5.1)
SODIUM: 142 mmol/L (ref 135–145)

## 2015-07-15 LAB — RENAL FUNCTION PANEL
Albumin: 1.8 g/dL — ABNORMAL LOW (ref 3.5–5.0)
Anion gap: 9 (ref 5–15)
BUN: 30 mg/dL — ABNORMAL HIGH (ref 6–20)
CALCIUM: 8.4 mg/dL — AB (ref 8.9–10.3)
CHLORIDE: 106 mmol/L (ref 101–111)
CO2: 27 mmol/L (ref 22–32)
CREATININE: 2.97 mg/dL — AB (ref 0.44–1.00)
GFR, EST AFRICAN AMERICAN: 20 mL/min — AB (ref >60)
GFR, EST NON AFRICAN AMERICAN: 17 mL/min — AB (ref >60)
Glucose, Bld: 97 mg/dL (ref 65–99)
Phosphorus: 3.7 mg/dL (ref 2.5–4.6)
Potassium: 4.1 mmol/L (ref 3.5–5.1)
Sodium: 142 mmol/L (ref 135–145)

## 2015-07-15 LAB — CBC
HCT: 29.4 % — ABNORMAL LOW (ref 36.0–46.0)
HEMOGLOBIN: 9.2 g/dL — AB (ref 12.0–15.0)
MCH: 30.9 pg (ref 26.0–34.0)
MCHC: 31.3 g/dL (ref 30.0–36.0)
MCV: 98.7 fL (ref 78.0–100.0)
PLATELETS: 190 10*3/uL (ref 150–400)
RBC: 2.98 MIL/uL — AB (ref 3.87–5.11)
RDW: 15.3 % (ref 11.5–15.5)
WBC: 9.4 10*3/uL (ref 4.0–10.5)

## 2015-07-15 LAB — VANCOMYCIN, TROUGH: VANCOMYCIN TR: 17 ug/mL (ref 10.0–20.0)

## 2015-07-15 NOTE — Progress Notes (Signed)
Subjective:  Pt seen during HD. Sititng up in chair. Able to verbalize as PM valve in place. States shes hungry.   Objective:  Vital signs in last 24 hours:  97.4 80 20 126/62    Physical Exam: General: NAD   HEENT anicteric, OM moist  Neck Supple trach in place PM valve in place  Pulm/lungs Coarse b/l , normal effort  CVS/Heart Regular, no rub  Abdomen:  Soft, non tender, BS present  Extremities: 1+ b/l upper and lower extremity edema  Neurologic: Awake, alert, conversive  Skin: Warm, no acute rashes  Access: Rt IJ PC       Basic Metabolic Panel:  Recent Labs Lab 07/11/15 0515 07/11/15 0550 07/13/15 0638 07/15/15 0703  NA 140 141 142 142  K 4.6 4.6 4.9 4.1  CL 104 108 109 106  CO2 GLUCOSE 34* 36* 84 97  BUN 31* 31* 40* 30*  CREATININE 2.64* 2.65* 3.37* 2.97*  CALCIUM 8.2* 8.4* 8.4* 8.4*  PHOS  --  2.8 4.1 3.7     CBC:  Recent Labs Lab 07/11/15 0515 07/13/15 0637 07/15/15 0703  WBC 13.1* 9.5 9.4  HGB 8.1* 8.2* 9.2*  HCT 25.8* 26.0* 29.4*  MCV 98.5 97.4 98.7  PLT 233 253 190      Microbiology: Results for orders placed or performed during the hospital encounter of 06/15/15  Culture, respiratory (NON-Expectorated)     Status: None   Collection Time: 06/16/15  3:15 PM  Result Value Ref Range Status   Specimen Description TRACHEAL ASPIRATE  Final   Special Requests NONE  Final   Gram Stain   Final    ABUNDANT WBC PRESENT,BOTH PMN AND MONONUCLEAR RARE SQUAMOUS EPITHELIAL CELLS PRESENT NO ORGANISMS SEEN Performed at [facility]    Culture   Final    FEW PSEUDOMONAS AERUGINOSA FEW STENOTROPHOMONAS MALTOPHILIA Performed at [facility]    Report Status 06/20/2015 FINAL  Final   Organism ID, Bacteria PSEUDOMONAS AERUGINOSA  Final   Organism ID, Bacteria STENOTROPHOMONAS MALTOPHILIA  Final      Susceptibility   Pseudomonas aeruginosa - MIC*    CEFEPIME 8 SENSITIVE Sensitive     CIPROFLOXACIN >=4 RESISTANT  Resistant     GENTAMICIN 8 INTERMEDIATE Intermediate     IMIPENEM 2 SENSITIVE Sensitive     TOBRAMYCIN <=1 SENSITIVE Sensitive     * FEW PSEUDOMONAS AERUGINOSA   Stenotrophomonas maltophilia - MIC*    TRIMETH/SULFA <=20 SENSITIVE Sensitive     LEVOFLOXACIN 1 SENSITIVE Sensitive     * FEW STENOTROPHOMONAS MALTOPHILIA  Culture, blood (routine x 2)     Status: None   Collection Time: 06/16/15  3:30 PM  Result Value Ref Range Status   Specimen Description BLOOD RIGHT HAND  Final   Special Requests IN PEDIATRIC BOTTLE 3CC  Final   Culture NO GROWTH 5 DAYS  Final   Report Status 06/21/2015 FINAL  Final  Culture, blood (routine x 2)     Status: None   Collection Time: 06/16/15  3:40 PM  Result Value Ref Range Status   Specimen Description BLOOD LEFT HAND  Final   Special Requests IN PEDIATRIC BOTTLE 3CC  Final   Culture NO GROWTH 5 DAYS  Final   Report Status 06/21/2015 FINAL  Final  Stat Gram stain     Status: None   Collection Time: 06/30/15  6:37 PM  Result Value Ref Range Status   Specimen Description URINE, RANDOM  Final  Special Requests NONE  Final   Gram Stain   Final    WBC PRESENT,BOTH PMN AND MONONUCLEAR GRAM NEGATIVE RODS CYTOSPIN Gram Stain Report Called to,Read Back By and Verified With: B OLOFINTUYI RN [id number] 06/30/15 A BROWNING    Report Status 06/30/2015 FINAL  Final  Culture, Urine     Status: None   Collection Time: 07/11/15  1:45 PM  Result Value Ref Range Status   Specimen Description URINE, RANDOM  Final   Special Requests NONE  Final   Culture MULTIPLE SPECIES PRESENT, SUGGEST RECOLLECTION  Final   Report Status 07/12/2015 FINAL  Final  Culture, blood (routine x 2)     Status: None (Preliminary result)   Collection Time: 07/11/15  3:20 PM  Result Value Ref Range Status   Specimen Description BLOOD RIGHT ARM  Final   Special Requests IN PEDIATRIC BOTTLE  4CC  Final   Culture NO GROWTH 3 DAYS  Final   Report Status PENDING  Incomplete  Culture, blood  (routine x 2)     Status: None (Preliminary result)   Collection Time: 07/11/15  3:25 PM  Result Value Ref Range Status   Specimen Description BLOOD PICC LINE  Final   Special Requests BOTTLES DRAWN AEROBIC AND ANAEROBIC 10CC  Final   Culture NO GROWTH 3 DAYS  Final   Report Status PENDING  Incomplete    Coagulation Studies: No results for input(s): LABPROT, INR in the last 72 hours.  Urinalysis: No results for input(s): COLORURINE, LABSPEC, PHURINE, GLUCOSEU, HGBUR, BILIRUBINUR, KETONESUR, PROTEINUR, UROBILINOGEN, NITRITE, LEUKOCYTESUR in the last 72 hours.  Invalid input(s): APPERANCEUR    Imaging: No results found.   Medications:   Aranesp   Assessment/ Plan:  49 y.o. 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  1. End-stage renal disease on hemodialysis. The patient has been on dialysis since October of 2014. -BP improved today, seen during HD, tolerating well, UF target 1.5-2kg as tolerated, will complete HD today and plan for HD tomorrow.   2. Anemia of CKD:  hgb up to 9.2, continue aranesp [place] weekly.  3. Secondary hyperparathyroidism.  -  Phos acceptable at 3.7.    4. Chronic respiratory failure.  - has PM valve in place, speaking readily.      Christine Schiefelbein 8/26/20168:18 AM

## 2015-07-16 LAB — CULTURE, BLOOD (ROUTINE X 2)
Culture: NO GROWTH
Culture: NO GROWTH

## 2015-07-16 LAB — HEPATITIS B SURFACE ANTIGEN: HEP B S AG: NEGATIVE

## 2015-07-17 ENCOUNTER — Other Ambulatory Visit (HOSPITAL_COMMUNITY): Payer: Self-pay

## 2015-07-18 LAB — RENAL FUNCTION PANEL
ALBUMIN: 1.6 g/dL — AB (ref 3.5–5.0)
ANION GAP: 8 (ref 5–15)
BUN: 40 mg/dL — ABNORMAL HIGH (ref 6–20)
CALCIUM: 8.5 mg/dL — AB (ref 8.9–10.3)
CO2: 27 mmol/L (ref 22–32)
Chloride: 109 mmol/L (ref 101–111)
Creatinine, Ser: 3.24 mg/dL — ABNORMAL HIGH (ref 0.44–1.00)
GFR calc non Af Amer: 16 mL/min — ABNORMAL LOW (ref >60)
GFR, EST AFRICAN AMERICAN: 18 mL/min — AB (ref >60)
GLUCOSE: 114 mg/dL — AB (ref 65–99)
PHOSPHORUS: 5 mg/dL — AB (ref 2.5–4.6)
Potassium: 4.8 mmol/L (ref 3.5–5.1)
SODIUM: 144 mmol/L (ref 135–145)

## 2015-07-18 LAB — CBC
HCT: 27.4 % — ABNORMAL LOW (ref 36.0–46.0)
HEMOGLOBIN: 8.6 g/dL — AB (ref 12.0–15.0)
MCH: 30.4 pg (ref 26.0–34.0)
MCHC: 31.4 g/dL (ref 30.0–36.0)
MCV: 96.8 fL (ref 78.0–100.0)
Platelets: 208 10*3/uL (ref 150–400)
RBC: 2.83 MIL/uL — ABNORMAL LOW (ref 3.87–5.11)
RDW: 15.5 % (ref 11.5–15.5)
WBC: 10 10*3/uL (ref 4.0–10.5)

## 2015-07-18 NOTE — Progress Notes (Signed)
Subjective:  Pt dialysis earlier today Able to verbalize as PM valve in place. Able to eat some snacks 2500 cc of fluid was removed and urinalysis   Objective:  Vital signs in last 24 hours:  Temperature 98.1, pulse 67, respirations 18, blood pressure 139/69    Physical Exam: General: NAD   HEENT anicteric, OM moist  Neck Supple trach in place PM valve in place  Pulm/lungs Coarse b/l , normal effort  CVS/Heart Regular, no rub  Abdomen:  Soft, non tender, BS present  Extremities: 1+ b/l upper and lower extremity edema  Neurologic: Awake, alert, conversive  Skin: Warm, no acute rashes  Access: Rt IJ PC       Basic Metabolic Panel:  Recent Labs Lab 07/13/15 0638 07/15/15 0703 07/18/15 0617  NA 142 142 144  K 4.9 4.1 4.8  CL 109 106 109  CO2 GLUCOSE 84 97 114*  BUN 40* 30* 40*  CREATININE 3.37* 2.97* 3.24*  CALCIUM 8.4* 8.4* 8.5*  PHOS 4.1 3.7 5.0*     CBC:  Recent Labs Lab 07/13/15 0637 07/15/15 0703 07/18/15 0617  WBC 9.5 9.4 10.0  HGB 8.2* 9.2* 8.6*  HCT 26.0* 29.4* 27.4*  MCV 97.4 98.7 96.8  PLT 253 190 208      Microbiology: Results for orders placed or performed during the hospital encounter of 06/15/15  Culture, respiratory (NON-Expectorated)     Status: None   Collection Time: 06/16/15  3:15 PM  Result Value Ref Range Status   Specimen Description TRACHEAL ASPIRATE  Final   Special Requests NONE  Final   Gram Stain   Final    ABUNDANT WBC PRESENT,BOTH PMN AND MONONUCLEAR RARE SQUAMOUS EPITHELIAL CELLS PRESENT NO ORGANISMS SEEN Performed at [facility]    Culture   Final    FEW PSEUDOMONAS AERUGINOSA FEW STENOTROPHOMONAS MALTOPHILIA Performed at [facility]    Report Status 06/20/2015 FINAL  Final   Organism ID, Bacteria PSEUDOMONAS AERUGINOSA  Final   Organism ID, Bacteria STENOTROPHOMONAS MALTOPHILIA  Final      Susceptibility   Pseudomonas aeruginosa - MIC*    CEFEPIME 8 SENSITIVE Sensitive    CIPROFLOXACIN >=4 RESISTANT Resistant     GENTAMICIN 8 INTERMEDIATE Intermediate     IMIPENEM 2 SENSITIVE Sensitive     TOBRAMYCIN <=1 SENSITIVE Sensitive     * FEW PSEUDOMONAS AERUGINOSA   Stenotrophomonas maltophilia - MIC*    TRIMETH/SULFA <=20 SENSITIVE Sensitive     LEVOFLOXACIN 1 SENSITIVE Sensitive     * FEW STENOTROPHOMONAS MALTOPHILIA  Culture, blood (routine x 2)     Status: None   Collection Time: 06/16/15  3:30 PM  Result Value Ref Range Status   Specimen Description BLOOD RIGHT HAND  Final   Special Requests IN PEDIATRIC BOTTLE 3CC  Final   Culture NO GROWTH 5 DAYS  Final   Report Status 06/21/2015 FINAL  Final  Culture, blood (routine x 2)     Status: None   Collection Time: 06/16/15  3:40 PM  Result Value Ref Range Status   Specimen Description BLOOD LEFT HAND  Final   Special Requests IN PEDIATRIC BOTTLE 3CC  Final   Culture NO GROWTH 5 DAYS  Final   Report Status 06/21/2015 FINAL  Final  Stat Gram stain     Status: None   Collection Time: 06/30/15  6:37 PM  Result Value Ref Range Status   Specimen Description URINE, RANDOM  Final   Special Requests  NONE  Final   Gram Stain   Final    WBC PRESENT,BOTH PMN AND MONONUCLEAR GRAM NEGATIVE RODS CYTOSPIN Gram Stain Report Called to,Read Back By and Verified With: B OLOFINTUYI RN [id number] 06/30/15 A BROWNING    Report Status 06/30/2015 FINAL  Final  Culture, Urine     Status: None   Collection Time: 07/11/15  1:45 PM  Result Value Ref Range Status   Specimen Description URINE, RANDOM  Final   Special Requests NONE  Final   Culture MULTIPLE SPECIES PRESENT, SUGGEST RECOLLECTION  Final   Report Status 07/12/2015 FINAL  Final  Culture, blood (routine x 2)     Status: None   Collection Time: 07/11/15  3:20 PM  Result Value Ref Range Status   Specimen Description BLOOD RIGHT ARM  Final   Special Requests IN PEDIATRIC BOTTLE  4CC  Final   Culture NO GROWTH 5 DAYS  Final   Report Status 07/16/2015 FINAL  Final  Culture,  blood (routine x 2)     Status: None   Collection Time: 07/11/15  3:25 PM  Result Value Ref Range Status   Specimen Description BLOOD PICC LINE  Final   Special Requests BOTTLES DRAWN AEROBIC AND ANAEROBIC 10CC  Final   Culture NO GROWTH 5 DAYS  Final   Report Status 07/16/2015 FINAL  Final    Coagulation Studies: No results for input(s): LABPROT, INR in the last 72 hours.  Urinalysis: No results for input(s): COLORURINE, LABSPEC, PHURINE, GLUCOSEU, HGBUR, BILIRUBINUR, KETONESUR, PROTEINUR, UROBILINOGEN, NITRITE, LEUKOCYTESUR in the last 72 hours.  Invalid input(s): APPERANCEUR    Imaging: Dg Chest Port 1 View  07/17/2015   CLINICAL DATA:  Followup of pneumonia.  EXAM: PORTABLE CHEST - 1 VIEW  COMPARISON:  06/30/2015  FINDINGS: Left-sided internal jugular line terminates at the high SVC. Dialysis catheter from right-sided approach is unchanged with type at right atrium. Removal of nasogastric tube. Tracheostomy. Normal heart size. No pleural effusion or pneumothorax. Clear lungs.  IMPRESSION: No acute cardiopulmonary disease.   Electronically Signed   By: Jeronimo Greaves M.D.   On: 07/17/2015 09:51    Results for [facility] RYHANNA, DUNSMORE (MRN [id number]) as of 07/18/2015 15:59  Ref. Range 07/15/2015 12:12  Hepatitis B Surface Ag Latest Ref Range: Negative  Negative   Medications:   Aranesp   Assessment/ Plan:  49 y.o. 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  1. End-stage renal disease on hemodialysis. The patient has been on dialysis since October of 2014. -Plan for HD on Wednesday - d/c planning to start- PPD. - consult vascular surgery to evaluate for AVF placement   2. Anemia of CKD:  hgb up to 8.6, continue aranesp [place] weekly.  3. Secondary hyperparathyroidism.  -  Phos 5.0.    4. Chronic respiratory failure.  - has PM valve in place, speaking readily.      Mosetta Pigeon 8/29/20163:56 PM

## 2015-07-18 NOTE — Progress Notes (Addendum)
   Daily Progress Note  Vein mapping ordered.  Full consult to follow tomorrow.  This patient is a poor candidate for permanent access unless she has an adequate vein for access.  Any arteriovenous graft placed in this patient is at risk for infection.   Leonides Sake, MD [facility] [place] Office: [phone] Pager: [phone]  07/18/2015, 5:17 PM

## 2015-07-19 ENCOUNTER — Encounter (HOSPITAL_COMMUNITY): Payer: Self-pay

## 2015-07-19 DIAGNOSIS — N185 Chronic kidney disease, stage 5: Secondary | ICD-10-CM | POA: Diagnosis not present

## 2015-07-19 NOTE — Consult Note (Addendum)
Referred by:  Dr. Thedore Mins (Nephrology)  Reason for referral: New access  History of Present Illness  Courtney [name] is a 49 y.o. ([birth year]) female who presents for evaluation for permanent access.  The patient is right hand dominant.  The patient has not had previous access procedures.  Previous central venous cannulation procedures include: RIJV TDC.  The patient has never had a PPM placed.  Pt reported has been on HD via a [facility] for >1 year.  Past Medical History  Diagnosis Date  . Obesity   . Dyslipidemia   . Hypertension   . Coronary artery disease     s/p BMS 2010 LAD  . Dysrhythmia     ventricular tachycardia resolved after LAD stent and beta blocker  . Diabetes mellitus     with retinopathy, neuropathy and microalbuminemia  DM Morbid obesity Osteomyelitis of spine ESRD-HD Sacral decubitus HCAP Pseudomonas Cardiac arrest CDiff DVT Enterocutaneous fistula   Past Surgical History  Procedure Laterality Date  . Cholecystectomy  1990  . Pci lad  12/2010  . Colonoscopy  08/2011  . Left heart catheterization with coronary angiogram N/A 01/28/2012    Procedure: LEFT HEART CATHETERIZATION WITH CORONARY ANGIOGRAM;  Surgeon: Lesleigh Noe, MD;  Location: [facility] CATH LAB;  Service: Cardiovascular;  Laterality: N/A;  Tracheostomy Gastric bypass RIJV TDC   Social History   Social History  . Marital Status: Married    Spouse Name: N/A  . Number of Children: N/A  . Years of Education: N/A   Occupational History  . Not on file.   Social History Main Topics  . Smoking status: Never Smoker   . Smokeless tobacco: Never Used  . Alcohol Use: Yes     Comment: occassional  . Drug Use: No  . Sexual Activity: Yes   Other Topics Concern  . Not on file   Social History Narrative    Family History  Problem Relation Age of Onset  . Hypertension Father   . Diabetes Father   . Cancer Mother   . Diabetes Paternal Grandfather   . Hypertension Paternal  Grandfather   . Diabetes Paternal Grandmother   . Hypertension Paternal Grandmother   . Cancer Maternal Grandmother     colon ca    No current facility-administered medications on file prior to encounter.   Current Outpatient Prescriptions on File Prior to Encounter  Medication Sig Dispense Refill  . amLODipine-olmesartan (AZOR) 5-20 MG per tablet Take 1 tablet by mouth daily.    Marland Kitchen aspirin 325 MG tablet Take 325 mg by mouth daily.    . ferrous sulfate 325 (65 FE) MG tablet Take 325 mg by mouth 2 (two) times daily with a meal.    . fish oil-omega-3 fatty acids 1000 MG capsule Take 2 g by mouth daily.    Marland Kitchen gabapentin (NEURONTIN) 300 MG capsule Take 300 mg by mouth 2 (two) times daily.    . insulin glargine (LANTUS) 100 UNIT/ML injection Inject 50 Units into the skin at bedtime.     . metFORMIN (GLUCOPHAGE) 1000 MG tablet Take 1,000 mg by mouth 2 (two) times daily with a meal.    . polyethylene glycol (MIRALAX / GLYCOLAX) packet Take 17 g by mouth daily.    . simvastatin (ZOCOR) 40 MG tablet Take 40 mg by mouth every evening.      Allergies  Allergen Reactions  . Contrast Media [Iodinated Diagnostic Agents] Anaphylaxis  . Ampicillin Rash     REVIEW OF  SYSTEMS:  (Positives checked otherwise negative)  CARDIOVASCULAR:   chest pain,  chest pressure,  palpitations,  shortness of breath when laying flat,  shortness of breath with exertion,   pain in feet when walking,  pain in feet when laying flat,  history of blood clot in veins (DVT),  history of phlebitis,  swelling in legs,  varicose veins  PULMONARY:   productive cough,  asthma,  wheezing  NEUROLOGIC:   weakness in arms or legs,  numbness in arms or legs,  difficulty speaking or slurred speech,  temporary loss of vision in one eye,  dizziness  HEMATOLOGIC:   bleeding problems,  problems with blood clotting too easily  MUSCULOSKEL:   joint pain,  joint swelling  GASTROINTEST:    vomiting blood,  blood in stool,  ECF,  s/p gastric bypass     GENITOURINARY:   burning with urination,  blood in urine  PSYCHIATRIC:   history of major depression  INTEGUMENTARY:   rashes,  ulcers  CONSTITUTIONAL:   fever,  chills   Physical Examination  VITALS 99.6 F 119/80 83 20 86% TC  General: awake, ill and weak appearing, ansarca  Head: [place]/AT  Ear/Nose/Throat: Hearing grossly intact, nares w/o erythema or drainage, oropharynx w/o Erythema/Exudate, Mallampati score: 3  Eyes: PERRLA, EOMI  Neck: Supple, no nuchal rigidity, no palpable LAD; tracheostomy with Passy-Muir valve  Pulmonary: Sym exp, good air movt, CTAB, no rhonchi, & wheezing, upper airway sounds with rales, distal breath sounds  Cardiac: RRR, Nl S1, S2, no Murmurs, rubs or gallops  Vascular: Vessel Right Left  Radial Weakly Palpable Palpable  Brachial Palpable Palpable  Carotid Palpable, without bruit Palpable, without bruit  Aorta Not palpable due to pannus N/A  Femoral Not examined Not examined  Popliteal Not palpable Not palpable  PT Not Palpable Not Palpable  DP Not Palpable Not Palpable   Gastrointestinal: soft, large pannus, -G/R, - HSM, - masses, ashen skin, +diarrhea  Musculoskeletal: weak throughout, unable to limbs, both feet without frank gangrene but ischemic appearance  Neurologic: unable to cooperate with examination due to weakness, Pain and light touch intact in extremities, Motor exam as listed above  Psychiatric: Judgment intact, psychomotor slowing, depressed mood  Dermatologic: See M/S exam for extremity exam, no rashes otherwise noted  Lymph : No Cervical, Axillary, or Inguinal lymphadenopathy    Non-Invasive Vascular Imaging  Vein Mapping: ordered   Medical Decision Making  Courtney [name] is a 49 y.o. female who presents with multiple co-morbidities including ECF, Stage IV decubitus ulcer, DM, ESRD requiring hemodialysis, HCAP, s/p  trach, s/p RIJV TDC.    Clearly this patient has had an catastrophic event with slow recovery.    From my one time evaluation of this patient, I have concerns this patient is on a downward trajectory.  Vein mapping ordered  I would not offer anything but an autologous access in this patient given the multiple infection risks in this patient.  Will check on patient later this week once vein mapping completed.  Leonides Sake, MD [facility] [place] Office: [phone] Pager: [phone]  07/19/2015, 10:20 AM

## 2015-07-20 ENCOUNTER — Encounter (HOSPITAL_BASED_OUTPATIENT_CLINIC_OR_DEPARTMENT_OTHER): Payer: 59

## 2015-07-20 DIAGNOSIS — Z992 Dependence on renal dialysis: Secondary | ICD-10-CM

## 2015-07-20 DIAGNOSIS — N186 End stage renal disease: Secondary | ICD-10-CM

## 2015-07-20 LAB — RENAL FUNCTION PANEL
ALBUMIN: 1.8 g/dL — AB (ref 3.5–5.0)
ANION GAP: 9 (ref 5–15)
BUN: 29 mg/dL — AB (ref 6–20)
CO2: 28 mmol/L (ref 22–32)
Calcium: 8.6 mg/dL — ABNORMAL LOW (ref 8.9–10.3)
Chloride: 109 mmol/L (ref 101–111)
Creatinine, Ser: 2.8 mg/dL — ABNORMAL HIGH (ref 0.44–1.00)
GFR calc Af Amer: 22 mL/min — ABNORMAL LOW (ref >60)
GFR calc non Af Amer: 19 mL/min — ABNORMAL LOW (ref >60)
GLUCOSE: 89 mg/dL (ref 65–99)
PHOSPHORUS: 4.8 mg/dL — AB (ref 2.5–4.6)
POTASSIUM: 4.6 mmol/L (ref 3.5–5.1)
SODIUM: 146 mmol/L — AB (ref 135–145)

## 2015-07-20 LAB — CBC
HEMATOCRIT: 25.6 % — AB (ref 36.0–46.0)
HEMOGLOBIN: 7.9 g/dL — AB (ref 12.0–15.0)
MCH: 29.9 pg (ref 26.0–34.0)
MCHC: 30.9 g/dL (ref 30.0–36.0)
MCV: 97 fL (ref 78.0–100.0)
Platelets: 195 10*3/uL (ref 150–400)
RBC: 2.64 MIL/uL — ABNORMAL LOW (ref 3.87–5.11)
RDW: 16.1 % — AB (ref 11.5–15.5)
WBC: 8.2 10*3/uL (ref 4.0–10.5)

## 2015-07-20 NOTE — Progress Notes (Signed)
Subjective:  Pt dialysis earlier today Able to verbalize as PM valve in place.    Objective:  Vital signs in last 24 hours:  Temperature 98.1, pulse 67, respirations 18, blood pressure 139/69    Physical Exam: General: NAD   HEENT anicteric, OM moist  Neck Supple trach in place PM valve in place  Pulm/lungs Coarse b/l , normal effort  CVS/Heart Regular, no rub  Abdomen:  Soft, non tender, BS present  Extremities: 1+ b/l upper and lower extremity edema  Neurologic: Awake, alert, conversive  Skin: Warm, no acute rashes  Access: Rt IJ PC       Basic Metabolic Panel:  Recent Labs Lab 07/15/15 0703 07/18/15 0617 07/20/15 0926  NA 142 144 146*  K 4.1 4.8 4.6  CL 106 109 109  CO2 27 27 28   GLUCOSE 97 114* 89  BUN 30* 40* 29*  CREATININE 2.97* 3.24* 2.80*  CALCIUM 8.4* 8.5* 8.6*  PHOS 3.7 5.0* 4.8*     CBC:  Recent Labs Lab 07/15/15 0703 07/18/15 0617 07/20/15 0926  WBC 9.4 10.0 8.2  HGB 9.2* 8.6* 7.9*  HCT 29.4* 27.4* 25.6*  MCV 98.7 96.8 97.0  PLT 190 208 195      Microbiology: Results for orders placed or performed during the hospital encounter of 06/15/15  Culture, respiratory (NON-Expectorated)     Status: None   Collection Time: 06/16/15  3:15 PM  Result Value Ref Range Status   Specimen Description TRACHEAL ASPIRATE  Final   Special Requests NONE  Final   Gram Stain   Final    ABUNDANT WBC PRESENT,BOTH PMN AND MONONUCLEAR RARE SQUAMOUS EPITHELIAL CELLS PRESENT NO ORGANISMS SEEN Performed at [facility]    Culture   Final    FEW PSEUDOMONAS AERUGINOSA FEW STENOTROPHOMONAS MALTOPHILIA Performed at [facility]    Report Status 06/20/2015 FINAL  Final   Organism ID, Bacteria PSEUDOMONAS AERUGINOSA  Final   Organism ID, Bacteria STENOTROPHOMONAS MALTOPHILIA  Final      Susceptibility   Pseudomonas aeruginosa - MIC*    CEFEPIME 8 SENSITIVE Sensitive     CIPROFLOXACIN >=4 RESISTANT Resistant     GENTAMICIN 8 INTERMEDIATE  Intermediate     IMIPENEM 2 SENSITIVE Sensitive     TOBRAMYCIN <=1 SENSITIVE Sensitive     * FEW PSEUDOMONAS AERUGINOSA   Stenotrophomonas maltophilia - MIC*    TRIMETH/SULFA <=20 SENSITIVE Sensitive     LEVOFLOXACIN 1 SENSITIVE Sensitive     * FEW STENOTROPHOMONAS MALTOPHILIA  Culture, blood (routine x 2)     Status: None   Collection Time: 06/16/15  3:30 PM  Result Value Ref Range Status   Specimen Description BLOOD RIGHT HAND  Final   Special Requests IN PEDIATRIC BOTTLE 3CC  Final   Culture NO GROWTH 5 DAYS  Final   Report Status 06/21/2015 FINAL  Final  Culture, blood (routine x 2)     Status: None   Collection Time: 06/16/15  3:40 PM  Result Value Ref Range Status   Specimen Description BLOOD LEFT HAND  Final   Special Requests IN PEDIATRIC BOTTLE 3CC  Final   Culture NO GROWTH 5 DAYS  Final   Report Status 06/21/2015 FINAL  Final  Stat Gram stain     Status: None   Collection Time: 06/30/15  6:37 PM  Result Value Ref Range Status   Specimen Description URINE, RANDOM  Final   Special Requests NONE  Final   Gram Stain   Final  WBC PRESENT,BOTH PMN AND MONONUCLEAR GRAM NEGATIVE RODS CYTOSPIN Gram Stain Report Called to,Read Back By and Verified With: B OLOFINTUYI RN [id number] 06/30/15 A BROWNING    Report Status 06/30/2015 FINAL  Final  Culture, Urine     Status: None   Collection Time: 07/11/15  1:45 PM  Result Value Ref Range Status   Specimen Description URINE, RANDOM  Final   Special Requests NONE  Final   Culture MULTIPLE SPECIES PRESENT, SUGGEST RECOLLECTION  Final   Report Status 07/12/2015 FINAL  Final  Culture, blood (routine x 2)     Status: None   Collection Time: 07/11/15  3:20 PM  Result Value Ref Range Status   Specimen Description BLOOD RIGHT ARM  Final   Special Requests IN PEDIATRIC BOTTLE  4CC  Final   Culture NO GROWTH 5 DAYS  Final   Report Status 07/16/2015 FINAL  Final  Culture, blood (routine x 2)     Status: None   Collection Time: 07/11/15   3:25 PM  Result Value Ref Range Status   Specimen Description BLOOD PICC LINE  Final   Special Requests BOTTLES DRAWN AEROBIC AND ANAEROBIC 10CC  Final   Culture NO GROWTH 5 DAYS  Final   Report Status 07/16/2015 FINAL  Final    Coagulation Studies: No results for input(s): LABPROT, INR in the last 72 hours.  Urinalysis: No results for input(s): COLORURINE, LABSPEC, PHURINE, GLUCOSEU, HGBUR, BILIRUBINUR, KETONESUR, PROTEINUR, UROBILINOGEN, NITRITE, LEUKOCYTESUR in the last 72 hours.  Invalid input(s): APPERANCEUR    Imaging: No results found.  Results for [facility] BETSAIDA, [place] (MRN [id number]) as of 07/18/2015 15:59  Ref. Range 07/15/2015 12:12  Hepatitis B Surface Ag Latest Ref Range: Negative  Negative   Medications:   Aranesp   Assessment/ Plan:  49 y.o. 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  1. End-stage renal disease on hemodialysis. The patient has been on dialysis since October of 2014. -  Plan for HD on  Friday -  d/c planning to start- PPD. -  Vein mapping done; difficult options for access   2. Anemia of CKD:  hgb up to 7.9, continue aranesp; increase dose.  3. Secondary hyperparathyroidism.  -  Phos 4.8.    4. Chronic respiratory failure.  - has PM valve in place,   5. Decubitus ulcer   Courtney [name] 8/31/20163:15 PM

## 2015-07-20 NOTE — Progress Notes (Signed)
VASCULAR LAB PRELIMINARY  PRELIMINARY  PRELIMINARY  PRELIMINARY  Bilateral upper extremity vein mapping completed.    Preliminary report:   No significant length of vein vizualized bilaterally.  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  Right basilic measures 2.31mm at 12.74mm deep at the mid upper arm and 2.5mm at 17.94mm deep.    Xiadani Damman, RVT 07/20/2015, 3:11 PM

## 2015-07-21 NOTE — Progress Notes (Signed)
   Daily Progress Note  Vein mapping demonstrates no options for AVF placement.  In my opinion, placing any arteriovenous graft will likely result in infection of that graft given her multiple potential infectious sources.  At this point, I would recommend continuing hemodialysis via her tunneled dialysis catheter.  If her sacral decubitus heals, I would reconsider placement of an arteriovenous graft.    Leonides Sake, MD [facility] [place] Office: [phone] Pager: [phone]  07/21/2015, 8:22 AM

## 2015-07-22 LAB — CBC
HCT: 23.8 % — ABNORMAL LOW (ref 36.0–46.0)
HEMOGLOBIN: 7.5 g/dL — AB (ref 12.0–15.0)
MCH: 30.1 pg (ref 26.0–34.0)
MCHC: 31.5 g/dL (ref 30.0–36.0)
MCV: 95.6 fL (ref 78.0–100.0)
Platelets: 250 10*3/uL (ref 150–400)
RBC: 2.49 MIL/uL — AB (ref 3.87–5.11)
RDW: 16.3 % — ABNORMAL HIGH (ref 11.5–15.5)
WBC: 12.9 10*3/uL — AB (ref 4.0–10.5)

## 2015-07-22 LAB — RENAL FUNCTION PANEL
ALBUMIN: 1.9 g/dL — AB (ref 3.5–5.0)
ANION GAP: 9 (ref 5–15)
BUN: 28 mg/dL — ABNORMAL HIGH (ref 6–20)
CALCIUM: 8.1 mg/dL — AB (ref 8.9–10.3)
CO2: 28 mmol/L (ref 22–32)
Chloride: 108 mmol/L (ref 101–111)
Creatinine, Ser: 2.78 mg/dL — ABNORMAL HIGH (ref 0.44–1.00)
GFR, EST AFRICAN AMERICAN: 22 mL/min — AB (ref >60)
GFR, EST NON AFRICAN AMERICAN: 19 mL/min — AB (ref >60)
Glucose, Bld: 35 mg/dL — CL (ref 65–99)
PHOSPHORUS: 5.1 mg/dL — AB (ref 2.5–4.6)
Potassium: 4.7 mmol/L (ref 3.5–5.1)
SODIUM: 145 mmol/L (ref 135–145)

## 2015-07-22 NOTE — Progress Notes (Signed)
Subjective:  Patient seen during dialysis Tolerating well  Able to verbalize as PM valve in place. NOT in chair today due to back pain Verbalize very little    Objective:  Vital signs in last 24 hours:   pulse 68  blood pressure 113/69    Physical Exam: General: NAD   HEENT anicteric, OM moist  Neck Supple trach in place PM valve in place  Pulm/lungs Coarse b/l , normal effort  CVS/Heart Regular, no rub  Abdomen:  Soft, non tender, BS present  Extremities: 1+ b/l upper and lower extremity edema  Neurologic: Lethargic today  Skin: Warm, no acute rashes  Access: Rt IJ PC       Basic Metabolic Panel:  Recent Labs Lab 07/18/15 0617 07/20/15 0926 07/22/15 0540  NA 144 146* 145  K 4.8 4.6 4.7  CL 109 109 108  CO2 27 28 28   GLUCOSE 114* 89 35*  BUN 40* 29* 28*  CREATININE 3.24* 2.80* 2.78*  CALCIUM 8.5* 8.6* 8.1*  PHOS 5.0* 4.8* 5.1*     CBC:  Recent Labs Lab 07/18/15 0617 07/20/15 0926 07/22/15 0500  WBC 10.0 8.2 12.9*  HGB 8.6* 7.9* 7.5*  HCT 27.4* 25.6* 23.8*  MCV 96.8 97.0 95.6  PLT 208 195 250      Microbiology: Results for orders placed or performed during the hospital encounter of 06/15/15  Culture, respiratory (NON-Expectorated)     Status: None   Collection Time: 06/16/15  3:15 PM  Result Value Ref Range Status   Specimen Description TRACHEAL ASPIRATE  Final   Special Requests NONE  Final   Gram Stain   Final    ABUNDANT WBC PRESENT,BOTH PMN AND MONONUCLEAR RARE SQUAMOUS EPITHELIAL CELLS PRESENT NO ORGANISMS SEEN Performed at [facility]    Culture   Final    FEW PSEUDOMONAS AERUGINOSA FEW STENOTROPHOMONAS MALTOPHILIA Performed at [facility]    Report Status 06/20/2015 FINAL  Final   Organism ID, Bacteria PSEUDOMONAS AERUGINOSA  Final   Organism ID, Bacteria STENOTROPHOMONAS MALTOPHILIA  Final      Susceptibility   Pseudomonas aeruginosa - MIC*    CEFEPIME 8 SENSITIVE Sensitive     CIPROFLOXACIN >=4  RESISTANT Resistant     GENTAMICIN 8 INTERMEDIATE Intermediate     IMIPENEM 2 SENSITIVE Sensitive     TOBRAMYCIN <=1 SENSITIVE Sensitive     * FEW PSEUDOMONAS AERUGINOSA   Stenotrophomonas maltophilia - MIC*    TRIMETH/SULFA <=20 SENSITIVE Sensitive     LEVOFLOXACIN 1 SENSITIVE Sensitive     * FEW STENOTROPHOMONAS MALTOPHILIA  Culture, blood (routine x 2)     Status: None   Collection Time: 06/16/15  3:30 PM  Result Value Ref Range Status   Specimen Description BLOOD RIGHT HAND  Final   Special Requests IN PEDIATRIC BOTTLE 3CC  Final   Culture NO GROWTH 5 DAYS  Final   Report Status 06/21/2015 FINAL  Final  Culture, blood (routine x 2)     Status: None   Collection Time: 06/16/15  3:40 PM  Result Value Ref Range Status   Specimen Description BLOOD LEFT HAND  Final   Special Requests IN PEDIATRIC BOTTLE 3CC  Final   Culture NO GROWTH 5 DAYS  Final   Report Status 06/21/2015 FINAL  Final  Stat Gram stain     Status: None   Collection Time: 06/30/15  6:37 PM  Result Value Ref Range Status   Specimen Description URINE, RANDOM  Final   Special  Requests NONE  Final   Gram Stain   Final    WBC PRESENT,BOTH PMN AND MONONUCLEAR GRAM NEGATIVE RODS CYTOSPIN Gram Stain Report Called to,Read Back By and Verified With: B OLOFINTUYI RN [id number] 06/30/15 A BROWNING    Report Status 06/30/2015 FINAL  Final  Culture, Urine     Status: None   Collection Time: 07/11/15  1:45 PM  Result Value Ref Range Status   Specimen Description URINE, RANDOM  Final   Special Requests NONE  Final   Culture MULTIPLE SPECIES PRESENT, SUGGEST RECOLLECTION  Final   Report Status 07/12/2015 FINAL  Final  Culture, blood (routine x 2)     Status: None   Collection Time: 07/11/15  3:20 PM  Result Value Ref Range Status   Specimen Description BLOOD RIGHT ARM  Final   Special Requests IN PEDIATRIC BOTTLE  4CC  Final   Culture NO GROWTH 5 DAYS  Final   Report Status 07/16/2015 FINAL  Final  Culture, blood (routine x  2)     Status: None   Collection Time: 07/11/15  3:25 PM  Result Value Ref Range Status   Specimen Description BLOOD PICC LINE  Final   Special Requests BOTTLES DRAWN AEROBIC AND ANAEROBIC 10CC  Final   Culture NO GROWTH 5 DAYS  Final   Report Status 07/16/2015 FINAL  Final    Coagulation Studies: No results for input(s): LABPROT, INR in the last 72 hours.  Urinalysis: No results for input(s): COLORURINE, LABSPEC, PHURINE, GLUCOSEU, HGBUR, BILIRUBINUR, KETONESUR, PROTEINUR, UROBILINOGEN, NITRITE, LEUKOCYTESUR in the last 72 hours.  Invalid input(s): APPERANCEUR    Imaging: No results found.  Results for Courtney [name], Courtney [name] (MRN [id number]) as of 07/18/2015 15:59  Ref. Range 07/15/2015 12:12  Hepatitis B Surface Ag Latest Ref Range: Negative  Negative   Medications:   Aranesp   Assessment/ Plan:  49 y.o. 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  1. End-stage renal disease on hemodialysis. The patient has been on dialysis since October of 2014. -  Plan for HD on  Friday -  d/c planning to start- PPD. -  Vein mapping done; no suitable veins. Not candidate for AVG until all infections and decubitus are cleared.  - Appreciate vascular surgery assistance   2. Anemia of CKD:  hgb up to 7.5, continue aranesp; increase dose.  3. Secondary hyperparathyroidism.  -  Phos 5.1   - Albumin 1.9    4. Chronic respiratory failure.  - has PM valve in place,  - pseudomonas - recommend ID evaluation  5. Decubitus ulcer   Tish Begin 9/2/20169:26 AM

## 2015-07-25 LAB — RENAL FUNCTION PANEL
Albumin: 1.8 g/dL — ABNORMAL LOW (ref 3.5–5.0)
Anion gap: 9 (ref 5–15)
BUN: 35 mg/dL — AB (ref 6–20)
CHLORIDE: 106 mmol/L (ref 101–111)
CO2: 29 mmol/L (ref 22–32)
Calcium: 8.2 mg/dL — ABNORMAL LOW (ref 8.9–10.3)
Creatinine, Ser: 3.16 mg/dL — ABNORMAL HIGH (ref 0.44–1.00)
GFR calc Af Amer: 19 mL/min — ABNORMAL LOW (ref >60)
GFR, EST NON AFRICAN AMERICAN: 16 mL/min — AB (ref >60)
Glucose, Bld: 103 mg/dL — ABNORMAL HIGH (ref 65–99)
POTASSIUM: 4.5 mmol/L (ref 3.5–5.1)
Phosphorus: 5.9 mg/dL — ABNORMAL HIGH (ref 2.5–4.6)
Sodium: 144 mmol/L (ref 135–145)

## 2015-07-25 LAB — CBC
HEMATOCRIT: 25.2 % — AB (ref 36.0–46.0)
Hemoglobin: 7.8 g/dL — ABNORMAL LOW (ref 12.0–15.0)
MCH: 29.8 pg (ref 26.0–34.0)
MCHC: 31 g/dL (ref 30.0–36.0)
MCV: 96.2 fL (ref 78.0–100.0)
Platelets: 234 10*3/uL (ref 150–400)
RBC: 2.62 MIL/uL — ABNORMAL LOW (ref 3.87–5.11)
RDW: 16.5 % — AB (ref 11.5–15.5)
WBC: 10.3 10*3/uL (ref 4.0–10.5)

## 2015-07-25 NOTE — Progress Notes (Signed)
Subjective:  Seen and examined on hemodialysis. Tolerating treatment well.  UF goal of 2 litres.     Objective:  Vital signs: T 98.1 p 83 rr20 bp 126/64    Physical Exam: General: NAD   HEENT anicteric, OM moist  Neck Supple trach in place PM valve in place  Pulm/lungs Coarse b/l , normal effort  CVS/Heart Regular, no rub  Abdomen:  Soft, non tender, BS present  Extremities: 1+ b/l upper and lower extremity edema  Neurologic: Lethargic today  Skin: Warm, no acute rashes  Access: Rt IJ PC       Basic Metabolic Panel:  Recent Labs Lab 07/20/15 0926 07/22/15 0540 07/25/15 0630  NA 146* 145 144  K 4.6 4.7 4.5  CL 109 108 106  CO2 28 28 29   GLUCOSE 89 35* 103*  BUN 29* 28* 35*  CREATININE 2.80* 2.78* 3.16*  CALCIUM 8.6* 8.1* 8.2*  PHOS 4.8* 5.1* 5.9*     CBC:  Recent Labs Lab 07/20/15 0926 07/22/15 0500 07/25/15 0630  WBC 8.2 12.9* 10.3  HGB 7.9* 7.5* 7.8*  HCT 25.6* 23.8* 25.2*  MCV 97.0 95.6 96.2  PLT 195 250 234      Microbiology: Results for orders placed or performed during the hospital encounter of 06/15/15  Culture, respiratory (NON-Expectorated)     Status: None   Collection Time: 06/16/15  3:15 PM  Result Value Ref Range Status   Specimen Description TRACHEAL ASPIRATE  Final   Special Requests NONE  Final   Gram Stain   Final    ABUNDANT WBC PRESENT,BOTH PMN AND MONONUCLEAR RARE SQUAMOUS EPITHELIAL CELLS PRESENT NO ORGANISMS SEEN Performed at [facility]    Culture   Final    FEW PSEUDOMONAS AERUGINOSA FEW STENOTROPHOMONAS MALTOPHILIA Performed at [facility]    Report Status 06/20/2015 FINAL  Final   Organism ID, Bacteria PSEUDOMONAS AERUGINOSA  Final   Organism ID, Bacteria STENOTROPHOMONAS MALTOPHILIA  Final      Susceptibility   Pseudomonas aeruginosa - MIC*    CEFEPIME 8 SENSITIVE Sensitive     CIPROFLOXACIN >=4 RESISTANT Resistant     GENTAMICIN 8 INTERMEDIATE Intermediate     IMIPENEM 2 SENSITIVE  Sensitive     TOBRAMYCIN <=1 SENSITIVE Sensitive     * FEW PSEUDOMONAS AERUGINOSA   Stenotrophomonas maltophilia - MIC*    TRIMETH/SULFA <=20 SENSITIVE Sensitive     LEVOFLOXACIN 1 SENSITIVE Sensitive     * FEW STENOTROPHOMONAS MALTOPHILIA  Culture, blood (routine x 2)     Status: None   Collection Time: 06/16/15  3:30 PM  Result Value Ref Range Status   Specimen Description BLOOD RIGHT HAND  Final   Special Requests IN PEDIATRIC BOTTLE 3CC  Final   Culture NO GROWTH 5 DAYS  Final   Report Status 06/21/2015 FINAL  Final  Culture, blood (routine x 2)     Status: None   Collection Time: 06/16/15  3:40 PM  Result Value Ref Range Status   Specimen Description BLOOD LEFT HAND  Final   Special Requests IN PEDIATRIC BOTTLE 3CC  Final   Culture NO GROWTH 5 DAYS  Final   Report Status 06/21/2015 FINAL  Final  Stat Gram stain     Status: None   Collection Time: 06/30/15  6:37 PM  Result Value Ref Range Status   Specimen Description URINE, RANDOM  Final   Special Requests NONE  Final   Gram Stain   Final    WBC PRESENT,BOTH  PMN AND MONONUCLEAR GRAM NEGATIVE RODS CYTOSPIN Gram Stain Report Called to,Read Back By and Verified With: B OLOFINTUYI RN [id number] 06/30/15 A BROWNING    Report Status 06/30/2015 FINAL  Final  Culture, Urine     Status: None   Collection Time: 07/11/15  1:45 PM  Result Value Ref Range Status   Specimen Description URINE, RANDOM  Final   Special Requests NONE  Final   Culture MULTIPLE SPECIES PRESENT, SUGGEST RECOLLECTION  Final   Report Status 07/12/2015 FINAL  Final  Culture, blood (routine x 2)     Status: None   Collection Time: 07/11/15  3:20 PM  Result Value Ref Range Status   Specimen Description BLOOD RIGHT ARM  Final   Special Requests IN PEDIATRIC BOTTLE  4CC  Final   Culture NO GROWTH 5 DAYS  Final   Report Status 07/16/2015 FINAL  Final  Culture, blood (routine x 2)     Status: None   Collection Time: 07/11/15  3:25 PM  Result Value Ref Range Status    Specimen Description BLOOD PICC LINE  Final   Special Requests BOTTLES DRAWN AEROBIC AND ANAEROBIC 10CC  Final   Culture NO GROWTH 5 DAYS  Final   Report Status 07/16/2015 FINAL  Final    Coagulation Studies: No results for input(s): LABPROT, INR in the last 72 hours.  Urinalysis: No results for input(s): COLORURINE, LABSPEC, PHURINE, GLUCOSEU, HGBUR, BILIRUBINUR, KETONESUR, PROTEINUR, UROBILINOGEN, NITRITE, LEUKOCYTESUR in the last 72 hours.  Invalid input(s): APPERANCEUR    Imaging: No results found.  Results for [facility] TIENNA, BIENKOWSKI (MRN [id number]) as of 07/18/2015 15:59  Ref. Range 07/15/2015 12:12  Hepatitis B Surface Ag Latest Ref Range: Negative  Negative   Medications:   Aranesp   Assessment/ Plan:  49 y.o. 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  1. End-stage renal disease on hemodialysis. The patient has been on dialysis since October of 2014. Continue MWF schedule. Tolerating treatment well today. Next treatment planned for Wednesday. Orders prepared.  -  Vein mapping done; no suitable veins. Not candidate for AVG until all infections and decubitus are cleared.  - Appreciate vascular surgery assistance   2. Anemia of CKD:  hgb up to 7.8, continue aranesp weekly  3. Secondary hyperparathyroidism: phos 5.9. PTH 32 on 06/16/15 -  Not currently on any  binders  4. Hypoalbuminemia: albumin of 1.8 - on megesterol and mirtazapine.   5. Hypertension: lisinopril and carvedilol.    George Mason, Threasa Heads 9/5/20164:41 PM

## 2015-07-27 LAB — CBC
HEMATOCRIT: 23 % — AB (ref 36.0–46.0)
HEMOGLOBIN: 7.1 g/dL — AB (ref 12.0–15.0)
MCH: 30.1 pg (ref 26.0–34.0)
MCHC: 30.9 g/dL (ref 30.0–36.0)
MCV: 97.5 fL (ref 78.0–100.0)
Platelets: 214 10*3/uL (ref 150–400)
RBC: 2.36 MIL/uL — ABNORMAL LOW (ref 3.87–5.11)
RDW: 16.9 % — AB (ref 11.5–15.5)
WBC: 9.6 10*3/uL (ref 4.0–10.5)

## 2015-07-27 LAB — RENAL FUNCTION PANEL
ALBUMIN: 1.7 g/dL — AB (ref 3.5–5.0)
Anion gap: 8 (ref 5–15)
BUN: 30 mg/dL — AB (ref 6–20)
CALCIUM: 8.3 mg/dL — AB (ref 8.9–10.3)
CO2: 30 mmol/L (ref 22–32)
Chloride: 107 mmol/L (ref 101–111)
Creatinine, Ser: 2.64 mg/dL — ABNORMAL HIGH (ref 0.44–1.00)
GFR calc Af Amer: 23 mL/min — ABNORMAL LOW (ref >60)
GFR, EST NON AFRICAN AMERICAN: 20 mL/min — AB (ref >60)
GLUCOSE: 99 mg/dL (ref 65–99)
PHOSPHORUS: 5 mg/dL — AB (ref 2.5–4.6)
POTASSIUM: 4.3 mmol/L (ref 3.5–5.1)
SODIUM: 145 mmol/L (ref 135–145)

## 2015-07-27 NOTE — Progress Notes (Signed)
Subjective:   Hemodialysis earlier today. UF of .    Objective:  Vital signs: T98.9 p73 r 16 bp127/76 SpO2 96%    Physical Exam: General: NAD   HEENT anicteric, OM moist  Neck Supple trach in place PM valve in place  Pulm/lungs Coarse b/l , normal effort  CVS/Heart Regular, no rub  Abdomen:  Soft, non tender, BS present  Extremities: no b/l upper and lower extremity edema  Neurologic: Lethargic today  Skin: Warm, no acute rashes  Access: Rt IJ PC       Basic Metabolic Panel:  Recent Labs Lab 07/22/15 0540 07/25/15 0630 07/27/15 0529  NA 145 144 145  K 4.7 4.5 4.3  CL 108 106 107  CO2 28 29 30   GLUCOSE 35* 103* 99  BUN 28* 35* 30*  CREATININE 2.78* 3.16* 2.64*  CALCIUM 8.1* 8.2* 8.3*  PHOS 5.1* 5.9* 5.0*     CBC:  Recent Labs Lab 07/22/15 0500 07/25/15 0630 07/27/15 0529  WBC 12.9* 10.3 9.6  HGB 7.5* 7.8* 7.1*  HCT 23.8* 25.2* 23.0*  MCV 95.6 96.2 97.5  PLT 250 234 214      Microbiology: Results for orders placed or performed during the hospital encounter of 06/15/15  Culture, respiratory (NON-Expectorated)     Status: None   Collection Time: 06/16/15  3:15 PM  Result Value Ref Range Status   Specimen Description TRACHEAL ASPIRATE  Final   Special Requests NONE  Final   Gram Stain   Final    ABUNDANT WBC PRESENT,BOTH PMN AND MONONUCLEAR RARE SQUAMOUS EPITHELIAL CELLS PRESENT NO ORGANISMS SEEN Performed at [facility]    Culture   Final    FEW PSEUDOMONAS AERUGINOSA FEW STENOTROPHOMONAS MALTOPHILIA Performed at [facility]    Report Status 06/20/2015 FINAL  Final   Organism ID, Bacteria PSEUDOMONAS AERUGINOSA  Final   Organism ID, Bacteria STENOTROPHOMONAS MALTOPHILIA  Final      Susceptibility   Pseudomonas aeruginosa - MIC*    CEFEPIME 8 SENSITIVE Sensitive     CIPROFLOXACIN >=4 RESISTANT Resistant     GENTAMICIN 8 INTERMEDIATE Intermediate     IMIPENEM 2 SENSITIVE Sensitive     TOBRAMYCIN <=1 SENSITIVE  Sensitive     * FEW PSEUDOMONAS AERUGINOSA   Stenotrophomonas maltophilia - MIC*    TRIMETH/SULFA <=20 SENSITIVE Sensitive     LEVOFLOXACIN 1 SENSITIVE Sensitive     * FEW STENOTROPHOMONAS MALTOPHILIA  Culture, blood (routine x 2)     Status: None   Collection Time: 06/16/15  3:30 PM  Result Value Ref Range Status   Specimen Description BLOOD RIGHT HAND  Final   Special Requests IN PEDIATRIC BOTTLE 3CC  Final   Culture NO GROWTH 5 DAYS  Final   Report Status 06/21/2015 FINAL  Final  Culture, blood (routine x 2)     Status: None   Collection Time: 06/16/15  3:40 PM  Result Value Ref Range Status   Specimen Description BLOOD LEFT HAND  Final   Special Requests IN PEDIATRIC BOTTLE 3CC  Final   Culture NO GROWTH 5 DAYS  Final   Report Status 06/21/2015 FINAL  Final  Stat Gram stain     Status: None   Collection Time: 06/30/15  6:37 PM  Result Value Ref Range Status   Specimen Description URINE, RANDOM  Final   Special Requests NONE  Final   Gram Stain   Final    WBC PRESENT,BOTH PMN AND MONONUCLEAR GRAM NEGATIVE RODS CYTOSPIN Gram  Stain Report Called to,Read Back By and Verified With: B OLOFINTUYI RN [id number] 06/30/15 A BROWNING    Report Status 06/30/2015 FINAL  Final  Culture, Urine     Status: None   Collection Time: 07/11/15  1:45 PM  Result Value Ref Range Status   Specimen Description URINE, RANDOM  Final   Special Requests NONE  Final   Culture MULTIPLE SPECIES PRESENT, SUGGEST RECOLLECTION  Final   Report Status 07/12/2015 FINAL  Final  Culture, blood (routine x 2)     Status: None   Collection Time: 07/11/15  3:20 PM  Result Value Ref Range Status   Specimen Description BLOOD RIGHT ARM  Final   Special Requests IN PEDIATRIC BOTTLE  4CC  Final   Culture NO GROWTH 5 DAYS  Final   Report Status 07/16/2015 FINAL  Final  Culture, blood (routine x 2)     Status: None   Collection Time: 07/11/15  3:25 PM  Result Value Ref Range Status   Specimen Description BLOOD PICC LINE   Final   Special Requests BOTTLES DRAWN AEROBIC AND ANAEROBIC 10CC  Final   Culture NO GROWTH 5 DAYS  Final   Report Status 07/16/2015 FINAL  Final    Coagulation Studies: No results for input(s): LABPROT, INR in the last 72 hours.  Urinalysis: No results for input(s): COLORURINE, LABSPEC, PHURINE, GLUCOSEU, HGBUR, BILIRUBINUR, KETONESUR, PROTEINUR, UROBILINOGEN, NITRITE, LEUKOCYTESUR in the last 72 hours.  Invalid input(s): APPERANCEUR    Imaging: No results found.  Results for [facility] Courtney [name], Courtney [name] (MRN [id number]) as of 07/18/2015 15:59  Ref. Range 07/15/2015 12:12  Hepatitis B Surface Ag Latest Ref Range: Negative  Negative   Medications:   Aranesp  Assessment/ Plan:  49 y.o. 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  1. End-stage renal disease on hemodialysis. The patient has been on dialysis since October of 2014. Continue MWF schedule. Tolerated treatment well today. Next treatment planned for Friday. Orders prepared.  -  Vein mapping done; no suitable veins. Not candidate for AVG until all infections and decubitus are cleared.  - Appreciate vascular surgery assistance - IV albumin as needed   2. Anemia of CKD:  hgb 7.1, continue aranesp weekly - low threshold for PRBC transfusion  3. Secondary hyperparathyroidism: phos 5 - at goal. PTH 32 on 06/16/15 -  Not  currently on any binders  4. Hypoalbuminemia: albumin of 1.7 - on megesterol and mirtazapine.   5. Hypertension: lisinopril and carvedilol.    [place], Threasa Heads 9/7/20163:31 PM

## 2015-07-28 LAB — PTH, INTACT AND CALCIUM
Calcium, Total (PTH): 8.1 mg/dL — ABNORMAL LOW (ref 8.7–10.2)
PTH: 66 pg/mL — AB (ref 15–65)

## 2015-07-29 LAB — RENAL FUNCTION PANEL
ANION GAP: 9 (ref 5–15)
Albumin: 1.9 g/dL — ABNORMAL LOW (ref 3.5–5.0)
BUN: 21 mg/dL — ABNORMAL HIGH (ref 6–20)
CALCIUM: 8.5 mg/dL — AB (ref 8.9–10.3)
CO2: 29 mmol/L (ref 22–32)
CREATININE: 2.57 mg/dL — AB (ref 0.44–1.00)
Chloride: 107 mmol/L (ref 101–111)
GFR, EST AFRICAN AMERICAN: 24 mL/min — AB (ref >60)
GFR, EST NON AFRICAN AMERICAN: 21 mL/min — AB (ref >60)
Glucose, Bld: 98 mg/dL (ref 65–99)
Phosphorus: 4.5 mg/dL (ref 2.5–4.6)
Potassium: 3.9 mmol/L (ref 3.5–5.1)
SODIUM: 145 mmol/L (ref 135–145)

## 2015-07-29 NOTE — Progress Notes (Signed)
Subjective:  Pt seen during HD today. Tolerating well. BFR 400.  UF goal achieved. Pt sitting up in chair. Was given albumin.   Objective:  Vital signs: 96.5 78 16 93/47   Physical Exam: General: NAD   HEENT anicteric, OM moist  Neck Supple trach in place PM valve in place  Pulm/lungs Scattered rhonchi , normal effort  CVS/Heart Regular, no rub  Abdomen:  Soft, non tender, BS present  Extremities: no lower extremity edema  Neurologic: Awake alert following commands  Skin: Warm, no acute rashes  Access: Rt IJ PC       Basic Metabolic Panel:  Recent Labs Lab 07/25/15 0630 07/27/15 0529 07/29/15 0544  NA 144 145 145  K 4.5 4.3 3.9  CL 106 107 107  CO2 GLUCOSE 103* 99 98  BUN 35* 30* 21*  CREATININE 3.16* 2.64* 2.57*  CALCIUM 8.2* 8.3*  8.1* 8.5*  PHOS 5.9* 5.0* 4.5     CBC:  Recent Labs Lab 07/25/15 0630 07/27/15 0529  WBC 10.3 9.6  HGB 7.8* 7.1*  HCT 25.2* 23.0*  MCV 96.2 97.5  PLT 234 214      Microbiology: Results for orders placed or performed during the hospital encounter of 06/15/15  Culture, respiratory (NON-Expectorated)     Status: None   Collection Time: 06/16/15  3:15 PM  Result Value Ref Range Status   Specimen Description TRACHEAL ASPIRATE  Final   Special Requests NONE  Final   Gram Stain   Final    ABUNDANT WBC PRESENT,BOTH PMN AND MONONUCLEAR RARE SQUAMOUS EPITHELIAL CELLS PRESENT NO ORGANISMS SEEN Performed at [facility]    Culture   Final    FEW PSEUDOMONAS AERUGINOSA FEW STENOTROPHOMONAS MALTOPHILIA Performed at [facility]    Report Status 06/20/2015 FINAL  Final   Organism ID, Bacteria PSEUDOMONAS AERUGINOSA  Final   Organism ID, Bacteria STENOTROPHOMONAS MALTOPHILIA  Final      Susceptibility   Pseudomonas aeruginosa - MIC*    CEFEPIME 8 SENSITIVE Sensitive     CIPROFLOXACIN >=4 RESISTANT Resistant     GENTAMICIN 8 INTERMEDIATE Intermediate     IMIPENEM 2 SENSITIVE Sensitive    TOBRAMYCIN <=1 SENSITIVE Sensitive     * FEW PSEUDOMONAS AERUGINOSA   Stenotrophomonas maltophilia - MIC*    TRIMETH/SULFA <=20 SENSITIVE Sensitive     LEVOFLOXACIN 1 SENSITIVE Sensitive     * FEW STENOTROPHOMONAS MALTOPHILIA  Culture, blood (routine x 2)     Status: None   Collection Time: 06/16/15  3:30 PM  Result Value Ref Range Status   Specimen Description BLOOD RIGHT HAND  Final   Special Requests IN PEDIATRIC BOTTLE 3CC  Final   Culture NO GROWTH 5 DAYS  Final   Report Status 06/21/2015 FINAL  Final  Culture, blood (routine x 2)     Status: None   Collection Time: 06/16/15  3:40 PM  Result Value Ref Range Status   Specimen Description BLOOD LEFT HAND  Final   Special Requests IN PEDIATRIC BOTTLE 3CC  Final   Culture NO GROWTH 5 DAYS  Final   Report Status 06/21/2015 FINAL  Final  Stat Gram stain     Status: None   Collection Time: 06/30/15  6:37 PM  Result Value Ref Range Status   Specimen Description URINE, RANDOM  Final   Special Requests NONE  Final   Gram Stain   Final    WBC PRESENT,BOTH PMN AND MONONUCLEAR GRAM NEGATIVE RODS CYTOSPIN  Gram Stain Report Called to,Read Back By and Verified With: B OLOFINTUYI RN 1739 06/30/15 A BROWNING    Report Status 06/30/2015 FINAL  Final  Culture, Urine     Status: None   Collection Time: 07/11/15  1:45 PM  Result Value Ref Range Status   Specimen Description URINE, RANDOM  Final   Special Requests NONE  Final   Culture MULTIPLE SPECIES PRESENT, SUGGEST RECOLLECTION  Final   Report Status 07/12/2015 FINAL  Final  Culture, blood (routine x 2)     Status: None   Collection Time: 07/11/15  3:20 PM  Result Value Ref Range Status   Specimen Description BLOOD RIGHT ARM  Final   Special Requests IN PEDIATRIC BOTTLE  4CC  Final   Culture NO GROWTH 5 DAYS  Final   Report Status 07/16/2015 FINAL  Final  Culture, blood (routine x 2)     Status: None   Collection Time: 07/11/15  3:25 PM  Result Value Ref Range Status   Specimen  Description BLOOD PICC LINE  Final   Special Requests BOTTLES DRAWN AEROBIC AND ANAEROBIC 10CC  Final   Culture NO GROWTH 5 DAYS  Final   Report Status 07/16/2015 FINAL  Final    Coagulation Studies: No results for input(s): LABPROT, INR in the last 72 hours.  Urinalysis: No results for input(s): COLORURINE, LABSPEC, PHURINE, GLUCOSEU, HGBUR, BILIRUBINUR, KETONESUR, PROTEINUR, UROBILINOGEN, NITRITE, LEUKOCYTESUR in the last 72 hours.  Invalid input(s): APPERANCEUR    Imaging: No results found.  Results for [facility] CEILIDH, TORREGROSSA (MRN [id number]) as of 07/18/2015 15:59  Ref. Range 07/15/2015 12:12  Hepatitis B Surface Ag Latest Ref Range: Negative  Negative   Medications:   Aranesp  Assessment/ Plan:  49 y.o. 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  1. End-stage renal disease on hemodialysis. The patient has been on dialysis since October of 2014.  Pt seen during HD today, tolerating well, achieving UF target.   Next HD on Monday.   2. Anemia of CKD:  hgb remains low at 7.1, continue aranesp, consider increasing dose next week.  3. Secondary hyperparathyroidism: Phos down to 4.5 and acceptable, will monitor.  4. Hypoalbuminemia: albumin of 1.7 - on megesterol and mirtazapine.   5. Hypertension: BP 93/47 towards the end of dialysis, receives albumin for BP support with  HD.    Keyly Baldonado 9/9/20168:59 AM

## 2015-07-30 LAB — CBC
HCT: 22.9 % — ABNORMAL LOW (ref 36.0–46.0)
Hemoglobin: 7 g/dL — ABNORMAL LOW (ref 12.0–15.0)
MCH: 30.3 pg (ref 26.0–34.0)
MCHC: 30.6 g/dL (ref 30.0–36.0)
MCV: 99.1 fL (ref 78.0–100.0)
PLATELETS: 203 10*3/uL (ref 150–400)
RBC: 2.31 MIL/uL — ABNORMAL LOW (ref 3.87–5.11)
RDW: 16.7 % — AB (ref 11.5–15.5)
WBC: 9.3 10*3/uL (ref 4.0–10.5)

## 2015-08-01 LAB — PHOSPHORUS: Phosphorus: 4.6 mg/dL (ref 2.5–4.6)

## 2015-08-01 LAB — BASIC METABOLIC PANEL
Anion gap: 8 (ref 5–15)
BUN: 35 mg/dL — AB (ref 6–20)
CALCIUM: 8.4 mg/dL — AB (ref 8.9–10.3)
CO2: 29 mmol/L (ref 22–32)
Chloride: 107 mmol/L (ref 101–111)
Creatinine, Ser: 3.15 mg/dL — ABNORMAL HIGH (ref 0.44–1.00)
GFR calc Af Amer: 19 mL/min — ABNORMAL LOW (ref >60)
GFR, EST NON AFRICAN AMERICAN: 16 mL/min — AB (ref >60)
GLUCOSE: 100 mg/dL — AB (ref 65–99)
Potassium: 4.3 mmol/L (ref 3.5–5.1)
Sodium: 144 mmol/L (ref 135–145)

## 2015-08-01 LAB — CBC
HCT: 25 % — ABNORMAL LOW (ref 36.0–46.0)
Hemoglobin: 7.6 g/dL — ABNORMAL LOW (ref 12.0–15.0)
MCH: 29.7 pg (ref 26.0–34.0)
MCHC: 30.4 g/dL (ref 30.0–36.0)
MCV: 97.7 fL (ref 78.0–100.0)
PLATELETS: 191 10*3/uL (ref 150–400)
RBC: 2.56 MIL/uL — ABNORMAL LOW (ref 3.87–5.11)
RDW: 16.7 % — AB (ref 11.5–15.5)
WBC: 11.6 10*3/uL — ABNORMAL HIGH (ref 4.0–10.5)

## 2015-08-01 LAB — ALBUMIN: Albumin: 1.9 g/dL — ABNORMAL LOW (ref 3.5–5.0)

## 2015-08-01 NOTE — Progress Notes (Signed)
Subjective:  Pt completed HD today.  UF achieved was 1.5.   Low BPs encountered during treatment. Lethargic at present but arousable.   Objective:  Vital signs: 98.3 69 18 121/66   Physical Exam: General: NAD   HEENT anicteric, OM moist  Neck Supple trach in place PM valve in place  Pulm/lungs Scattered rhonchi , normal effort  CVS/Heart Regular, no rub  Abdomen:  Soft, non tender, BS present  Extremities: no lower extremity edema  Neurologic: Lethargic but arousable  Skin: Warm, no acute rashes  Access: Rt IJ PC       Basic Metabolic Panel:  Recent Labs Lab 07/27/15 0529 07/29/15 0544 08/01/15 0527  NA 145 145 144  K 4.3 3.9 4.3  CL 107 107 107  CO2 30 29 29   GLUCOSE 99 98 100*  BUN 30* 21* 35*  CREATININE 2.64* 2.57* 3.15*  CALCIUM 8.3*  8.1* 8.5* 8.4*  PHOS 5.0* 4.5 4.6     CBC:  Recent Labs Lab 07/27/15 0529 07/30/15 0715 08/01/15 0527  WBC 9.6 9.3 11.6*  HGB 7.1* 7.0* 7.6*  HCT 23.0* 22.9* 25.0*  MCV 97.5 99.1 97.7  PLT 214 203 191      Microbiology: Results for orders placed or performed during the hospital encounter of 06/15/15  Culture, respiratory (NON-Expectorated)     Status: None   Collection Time: 06/16/15  3:15 PM  Result Value Ref Range Status   Specimen Description TRACHEAL ASPIRATE  Final   Special Requests NONE  Final   Gram Stain   Final    ABUNDANT WBC PRESENT,BOTH PMN AND MONONUCLEAR RARE SQUAMOUS EPITHELIAL CELLS PRESENT NO ORGANISMS SEEN Performed at [facility]    Culture   Final    FEW PSEUDOMONAS AERUGINOSA FEW STENOTROPHOMONAS MALTOPHILIA Performed at [facility]    Report Status 06/20/2015 FINAL  Final   Organism ID, Bacteria PSEUDOMONAS AERUGINOSA  Final   Organism ID, Bacteria STENOTROPHOMONAS MALTOPHILIA  Final      Susceptibility   Pseudomonas aeruginosa - MIC*    CEFEPIME 8 SENSITIVE Sensitive     CIPROFLOXACIN >=4 RESISTANT Resistant     GENTAMICIN 8 INTERMEDIATE Intermediate     IMIPENEM 2 SENSITIVE Sensitive     TOBRAMYCIN <=1 SENSITIVE Sensitive     * FEW PSEUDOMONAS AERUGINOSA   Stenotrophomonas maltophilia - MIC*    TRIMETH/SULFA <=20 SENSITIVE Sensitive     LEVOFLOXACIN 1 SENSITIVE Sensitive     * FEW STENOTROPHOMONAS MALTOPHILIA  Culture, blood (routine x 2)     Status: None   Collection Time: 06/16/15  3:30 PM  Result Value Ref Range Status   Specimen Description BLOOD RIGHT HAND  Final   Special Requests IN PEDIATRIC BOTTLE 3CC  Final   Culture NO GROWTH 5 DAYS  Final   Report Status 06/21/2015 FINAL  Final  Culture, blood (routine x 2)     Status: None   Collection Time: 06/16/15  3:40 PM  Result Value Ref Range Status   Specimen Description BLOOD LEFT HAND  Final   Special Requests IN PEDIATRIC BOTTLE 3CC  Final   Culture NO GROWTH 5 DAYS  Final   Report Status 06/21/2015 FINAL  Final  Stat Gram stain     Status: None   Collection Time: 06/30/15  6:37 PM  Result Value Ref Range Status   Specimen Description URINE, RANDOM  Final   Special Requests NONE  Final   Gram Stain   Final    WBC PRESENT,BOTH  PMN AND MONONUCLEAR GRAM NEGATIVE RODS CYTOSPIN Gram Stain Report Called to,Read Back By and Verified With: B OLOFINTUYI RN [id number] 06/30/15 A BROWNING    Report Status 06/30/2015 FINAL  Final  Culture, Urine     Status: None   Collection Time: 07/11/15  1:45 PM  Result Value Ref Range Status   Specimen Description URINE, RANDOM  Final   Special Requests NONE  Final   Culture MULTIPLE SPECIES PRESENT, SUGGEST RECOLLECTION  Final   Report Status 07/12/2015 FINAL  Final  Culture, blood (routine x 2)     Status: None   Collection Time: 07/11/15  3:20 PM  Result Value Ref Range Status   Specimen Description BLOOD RIGHT ARM  Final   Special Requests IN PEDIATRIC BOTTLE  4CC  Final   Culture NO GROWTH 5 DAYS  Final   Report Status 07/16/2015 FINAL  Final  Culture, blood (routine x 2)     Status: None   Collection Time: 07/11/15  3:25 PM  Result  Value Ref Range Status   Specimen Description BLOOD PICC LINE  Final   Special Requests BOTTLES DRAWN AEROBIC AND ANAEROBIC 10CC  Final   Culture NO GROWTH 5 DAYS  Final   Report Status 07/16/2015 FINAL  Final    Coagulation Studies: No results for input(s): LABPROT, INR in the last 72 hours.  Urinalysis: No results for input(s): COLORURINE, LABSPEC, PHURINE, GLUCOSEU, HGBUR, BILIRUBINUR, KETONESUR, PROTEINUR, UROBILINOGEN, NITRITE, LEUKOCYTESUR in the last 72 hours.  Invalid input(s): APPERANCEUR    Imaging: No results found.  Results for [facility] CARLISS, PORCARO (MRN [id number]) as of 07/18/2015 15:59  Ref. Range 07/15/2015 12:12  Hepatitis B Surface Ag Latest Ref Range: Negative  Negative   Medications:   Aranesp  Assessment/ Plan:  49 y.o. 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  1. End-stage renal disease on hemodialysis. The patient has been on dialysis since October of 2014.  Pt had HD today, UF achieved was only 1.5kg, was hypotensive, was also given albumin.  Will plan for conservative UF target of 1kg on Wednesday, will give albumin as well.   2. Anemia of CKD:  hgb up to 7.6, continue aranesp for now.  3. Secondary hyperparathyroidism: phos 4.6 and acceptable, will monitor.  4. Hypoalbuminemia: albumin slightly up to 1.9. - on megesterol and mirtazapine.   5.  Hypertension: requires albumin during HD to support blood pressure.   Sulamita Lafountain 9/12/20165:40 PM

## 2015-08-02 ENCOUNTER — Other Ambulatory Visit (HOSPITAL_COMMUNITY): Payer: 59

## 2015-08-03 LAB — CBC
HEMATOCRIT: 23.6 % — AB (ref 36.0–46.0)
Hemoglobin: 7.2 g/dL — ABNORMAL LOW (ref 12.0–15.0)
MCH: 29.9 pg (ref 26.0–34.0)
MCHC: 30.5 g/dL (ref 30.0–36.0)
MCV: 97.9 fL (ref 78.0–100.0)
Platelets: 168 10*3/uL (ref 150–400)
RBC: 2.41 MIL/uL — AB (ref 3.87–5.11)
RDW: 16.8 % — AB (ref 11.5–15.5)
WBC: 11.6 10*3/uL — AB (ref 4.0–10.5)

## 2015-08-03 LAB — RENAL FUNCTION PANEL
Albumin: 2 g/dL — ABNORMAL LOW (ref 3.5–5.0)
Anion gap: 7 (ref 5–15)
BUN: 26 mg/dL — ABNORMAL HIGH (ref 6–20)
CHLORIDE: 107 mmol/L (ref 101–111)
CO2: 29 mmol/L (ref 22–32)
Calcium: 8.5 mg/dL — ABNORMAL LOW (ref 8.9–10.3)
Creatinine, Ser: 2.58 mg/dL — ABNORMAL HIGH (ref 0.44–1.00)
GFR, EST AFRICAN AMERICAN: 24 mL/min — AB (ref >60)
GFR, EST NON AFRICAN AMERICAN: 21 mL/min — AB (ref >60)
Glucose, Bld: 104 mg/dL — ABNORMAL HIGH (ref 65–99)
POTASSIUM: 4.6 mmol/L (ref 3.5–5.1)
Phosphorus: 4 mg/dL (ref 2.5–4.6)
Sodium: 143 mmol/L (ref 135–145)

## 2015-08-06 LAB — CULTURE, BLOOD (ROUTINE X 2): CULTURE: NO GROWTH

## 2015-08-07 LAB — CULTURE, BLOOD (ROUTINE X 2): Culture: NO GROWTH

## 2015-08-12 ENCOUNTER — Inpatient Hospital Stay
Admission: EM | Admit: 2015-08-12 | Discharge: 2015-12-21 | DRG: 853 | Disposition: E | Payer: 59 | Attending: Internal Medicine | Admitting: Internal Medicine

## 2015-08-12 ENCOUNTER — Encounter: Payer: Self-pay | Admitting: Emergency Medicine

## 2015-08-12 ENCOUNTER — Inpatient Hospital Stay: Payer: 59

## 2015-08-12 ENCOUNTER — Emergency Department: Payer: 59

## 2015-08-12 ENCOUNTER — Other Ambulatory Visit: Payer: Self-pay

## 2015-08-12 ENCOUNTER — Ambulatory Visit
Admission: RE | Admit: 2015-08-12 | Discharge: 2015-08-12 | Disposition: A | Discharge status: Home / Self Care | Payer: 59 | Source: Ambulatory Visit | Attending: Nephrology | Admitting: Nephrology

## 2015-08-12 DIAGNOSIS — N2581 Secondary hyperparathyroidism of renal origin: Secondary | ICD-10-CM | POA: Diagnosis present

## 2015-08-12 DIAGNOSIS — I63443 Cerebral infarction due to embolism of bilateral cerebellar arteries: Secondary | ICD-10-CM | POA: Diagnosis not present

## 2015-08-12 DIAGNOSIS — G6181 Chronic inflammatory demyelinating polyneuritis: Secondary | ICD-10-CM | POA: Diagnosis present

## 2015-08-12 DIAGNOSIS — E876 Hypokalemia: Secondary | ICD-10-CM | POA: Diagnosis present

## 2015-08-12 DIAGNOSIS — I953 Hypotension of hemodialysis: Secondary | ICD-10-CM | POA: Diagnosis present

## 2015-08-12 DIAGNOSIS — Z7401 Bed confinement status: Secondary | ICD-10-CM | POA: Diagnosis not present

## 2015-08-12 DIAGNOSIS — R404 Transient alteration of awareness: Secondary | ICD-10-CM | POA: Diagnosis not present

## 2015-08-12 DIAGNOSIS — N179 Acute kidney failure, unspecified: Secondary | ICD-10-CM | POA: Diagnosis present

## 2015-08-12 DIAGNOSIS — G4733 Obstructive sleep apnea (adult) (pediatric): Secondary | ICD-10-CM | POA: Diagnosis present

## 2015-08-12 DIAGNOSIS — Z992 Dependence on renal dialysis: Secondary | ICD-10-CM | POA: Diagnosis not present

## 2015-08-12 DIAGNOSIS — R680 Hypothermia, not associated with low environmental temperature: Secondary | ICD-10-CM | POA: Diagnosis not present

## 2015-08-12 DIAGNOSIS — R6521 Severe sepsis with septic shock: Secondary | ICD-10-CM | POA: Diagnosis not present

## 2015-08-12 DIAGNOSIS — M7989 Other specified soft tissue disorders: Secondary | ICD-10-CM

## 2015-08-12 DIAGNOSIS — I639 Cerebral infarction, unspecified: Secondary | ICD-10-CM | POA: Insufficient documentation

## 2015-08-12 DIAGNOSIS — Y848 Other medical procedures as the cause of abnormal reaction of the patient, or of later complication, without mention of misadventure at the time of the procedure: Secondary | ICD-10-CM | POA: Diagnosis not present

## 2015-08-12 DIAGNOSIS — I9589 Other hypotension: Secondary | ICD-10-CM | POA: Diagnosis not present

## 2015-08-12 DIAGNOSIS — D631 Anemia in chronic kidney disease: Secondary | ICD-10-CM | POA: Diagnosis present

## 2015-08-12 DIAGNOSIS — J9610 Chronic respiratory failure, unspecified whether with hypoxia or hypercapnia: Secondary | ICD-10-CM | POA: Diagnosis not present

## 2015-08-12 DIAGNOSIS — Z934 Other artificial openings of gastrointestinal tract status: Secondary | ICD-10-CM

## 2015-08-12 DIAGNOSIS — E669 Obesity, unspecified: Secondary | ICD-10-CM | POA: Diagnosis present

## 2015-08-12 DIAGNOSIS — B961 Klebsiella pneumoniae [K. pneumoniae] as the cause of diseases classified elsewhere: Secondary | ICD-10-CM | POA: Diagnosis present

## 2015-08-12 DIAGNOSIS — F4321 Adjustment disorder with depressed mood: Secondary | ICD-10-CM | POA: Diagnosis present

## 2015-08-12 DIAGNOSIS — Z9884 Bariatric surgery status: Secondary | ICD-10-CM

## 2015-08-12 DIAGNOSIS — Z9911 Dependence on respirator [ventilator] status: Secondary | ICD-10-CM

## 2015-08-12 DIAGNOSIS — G931 Anoxic brain damage, not elsewhere classified: Secondary | ICD-10-CM | POA: Diagnosis not present

## 2015-08-12 DIAGNOSIS — Y92230 Patient room in hospital as the place of occurrence of the external cause: Secondary | ICD-10-CM | POA: Diagnosis not present

## 2015-08-12 DIAGNOSIS — D539 Nutritional anemia, unspecified: Secondary | ICD-10-CM | POA: Diagnosis present

## 2015-08-12 DIAGNOSIS — I2510 Atherosclerotic heart disease of native coronary artery without angina pectoris: Secondary | ICD-10-CM | POA: Diagnosis present

## 2015-08-12 DIAGNOSIS — B965 Pseudomonas (aeruginosa) (mallei) (pseudomallei) as the cause of diseases classified elsewhere: Secondary | ICD-10-CM | POA: Diagnosis present

## 2015-08-12 DIAGNOSIS — G92 Toxic encephalopathy: Secondary | ICD-10-CM | POA: Diagnosis not present

## 2015-08-12 DIAGNOSIS — J189 Pneumonia, unspecified organism: Secondary | ICD-10-CM

## 2015-08-12 DIAGNOSIS — R401 Stupor: Secondary | ICD-10-CM | POA: Diagnosis not present

## 2015-08-12 DIAGNOSIS — R509 Fever, unspecified: Secondary | ICD-10-CM

## 2015-08-12 DIAGNOSIS — J9611 Chronic respiratory failure with hypoxia: Secondary | ICD-10-CM | POA: Diagnosis not present

## 2015-08-12 DIAGNOSIS — D6959 Other secondary thrombocytopenia: Secondary | ICD-10-CM | POA: Diagnosis present

## 2015-08-12 DIAGNOSIS — G9341 Metabolic encephalopathy: Secondary | ICD-10-CM | POA: Diagnosis not present

## 2015-08-12 DIAGNOSIS — R400 Somnolence: Secondary | ICD-10-CM | POA: Diagnosis not present

## 2015-08-12 DIAGNOSIS — Z4659 Encounter for fitting and adjustment of other gastrointestinal appliance and device: Secondary | ICD-10-CM

## 2015-08-12 DIAGNOSIS — I132 Hypertensive heart and chronic kidney disease with heart failure and with stage 5 chronic kidney disease, or end stage renal disease: Secondary | ICD-10-CM | POA: Diagnosis present

## 2015-08-12 DIAGNOSIS — J9612 Chronic respiratory failure with hypercapnia: Secondary | ICD-10-CM | POA: Diagnosis not present

## 2015-08-12 DIAGNOSIS — I429 Cardiomyopathy, unspecified: Secondary | ICD-10-CM | POA: Diagnosis not present

## 2015-08-12 DIAGNOSIS — Z86711 Personal history of pulmonary embolism: Secondary | ICD-10-CM

## 2015-08-12 DIAGNOSIS — K922 Gastrointestinal hemorrhage, unspecified: Secondary | ICD-10-CM | POA: Diagnosis not present

## 2015-08-12 DIAGNOSIS — R0600 Dyspnea, unspecified: Secondary | ICD-10-CM

## 2015-08-12 DIAGNOSIS — R197 Diarrhea, unspecified: Secondary | ICD-10-CM

## 2015-08-12 DIAGNOSIS — I4891 Unspecified atrial fibrillation: Secondary | ICD-10-CM | POA: Diagnosis present

## 2015-08-12 DIAGNOSIS — G8191 Hemiplegia, unspecified affecting right dominant side: Secondary | ICD-10-CM | POA: Diagnosis not present

## 2015-08-12 DIAGNOSIS — J9621 Acute and chronic respiratory failure with hypoxia: Secondary | ICD-10-CM | POA: Diagnosis present

## 2015-08-12 DIAGNOSIS — J986 Disorders of diaphragm: Secondary | ICD-10-CM | POA: Diagnosis not present

## 2015-08-12 DIAGNOSIS — J9690 Respiratory failure, unspecified, unspecified whether with hypoxia or hypercapnia: Secondary | ICD-10-CM

## 2015-08-12 DIAGNOSIS — K255 Chronic or unspecified gastric ulcer with perforation: Secondary | ICD-10-CM

## 2015-08-12 DIAGNOSIS — L89154 Pressure ulcer of sacral region, stage 4: Secondary | ICD-10-CM | POA: Diagnosis present

## 2015-08-12 DIAGNOSIS — K631 Perforation of intestine (nontraumatic): Secondary | ICD-10-CM | POA: Diagnosis not present

## 2015-08-12 DIAGNOSIS — D696 Thrombocytopenia, unspecified: Secondary | ICD-10-CM | POA: Diagnosis not present

## 2015-08-12 DIAGNOSIS — L8990 Pressure ulcer of unspecified site, unspecified stage: Secondary | ICD-10-CM | POA: Diagnosis not present

## 2015-08-12 DIAGNOSIS — Z86718 Personal history of other venous thrombosis and embolism: Secondary | ICD-10-CM | POA: Diagnosis not present

## 2015-08-12 DIAGNOSIS — I255 Ischemic cardiomyopathy: Secondary | ICD-10-CM | POA: Diagnosis present

## 2015-08-12 DIAGNOSIS — J690 Pneumonitis due to inhalation of food and vomit: Secondary | ICD-10-CM | POA: Diagnosis not present

## 2015-08-12 DIAGNOSIS — Z7952 Long term (current) use of systemic steroids: Secondary | ICD-10-CM

## 2015-08-12 DIAGNOSIS — R609 Edema, unspecified: Secondary | ICD-10-CM

## 2015-08-12 DIAGNOSIS — J810 Acute pulmonary edema: Secondary | ICD-10-CM

## 2015-08-12 DIAGNOSIS — T380X5A Adverse effect of glucocorticoids and synthetic analogues, initial encounter: Secondary | ICD-10-CM | POA: Diagnosis present

## 2015-08-12 DIAGNOSIS — G6281 Critical illness polyneuropathy: Secondary | ICD-10-CM | POA: Diagnosis present

## 2015-08-12 DIAGNOSIS — R0602 Shortness of breath: Secondary | ICD-10-CM

## 2015-08-12 DIAGNOSIS — Z833 Family history of diabetes mellitus: Secondary | ICD-10-CM | POA: Diagnosis not present

## 2015-08-12 DIAGNOSIS — Z800 Family history of malignant neoplasm of digestive organs: Secondary | ICD-10-CM | POA: Diagnosis not present

## 2015-08-12 DIAGNOSIS — R5381 Other malaise: Secondary | ICD-10-CM | POA: Diagnosis not present

## 2015-08-12 DIAGNOSIS — T17908A Unspecified foreign body in respiratory tract, part unspecified causing other injury, initial encounter: Secondary | ICD-10-CM

## 2015-08-12 DIAGNOSIS — N186 End stage renal disease: Secondary | ICD-10-CM | POA: Diagnosis present

## 2015-08-12 DIAGNOSIS — I6350 Cerebral infarction due to unspecified occlusion or stenosis of unspecified cerebral artery: Secondary | ICD-10-CM | POA: Diagnosis not present

## 2015-08-12 DIAGNOSIS — R1310 Dysphagia, unspecified: Secondary | ICD-10-CM | POA: Diagnosis present

## 2015-08-12 DIAGNOSIS — B964 Proteus (mirabilis) (morganii) as the cause of diseases classified elsewhere: Secondary | ICD-10-CM | POA: Diagnosis present

## 2015-08-12 DIAGNOSIS — I481 Persistent atrial fibrillation: Secondary | ICD-10-CM | POA: Diagnosis not present

## 2015-08-12 DIAGNOSIS — F05 Delirium due to known physiological condition: Secondary | ICD-10-CM | POA: Diagnosis not present

## 2015-08-12 DIAGNOSIS — I468 Cardiac arrest due to other underlying condition: Secondary | ICD-10-CM | POA: Diagnosis not present

## 2015-08-12 DIAGNOSIS — I340 Nonrheumatic mitral (valve) insufficiency: Secondary | ICD-10-CM | POA: Diagnosis not present

## 2015-08-12 DIAGNOSIS — S2249XA Multiple fractures of ribs, unspecified side, initial encounter for closed fracture: Secondary | ICD-10-CM | POA: Diagnosis not present

## 2015-08-12 DIAGNOSIS — S72146A Nondisplaced intertrochanteric fracture of unspecified femur, initial encounter for closed fracture: Secondary | ICD-10-CM

## 2015-08-12 DIAGNOSIS — E785 Hyperlipidemia, unspecified: Secondary | ICD-10-CM | POA: Diagnosis present

## 2015-08-12 DIAGNOSIS — J9620 Acute and chronic respiratory failure, unspecified whether with hypoxia or hypercapnia: Secondary | ICD-10-CM | POA: Diagnosis not present

## 2015-08-12 DIAGNOSIS — I4892 Unspecified atrial flutter: Secondary | ICD-10-CM | POA: Diagnosis not present

## 2015-08-12 DIAGNOSIS — S72141A Displaced intertrochanteric fracture of right femur, initial encounter for closed fracture: Secondary | ICD-10-CM

## 2015-08-12 DIAGNOSIS — T17998A Other foreign object in respiratory tract, part unspecified causing other injury, initial encounter: Secondary | ICD-10-CM | POA: Diagnosis not present

## 2015-08-12 DIAGNOSIS — R6520 Severe sepsis without septic shock: Secondary | ICD-10-CM | POA: Diagnosis not present

## 2015-08-12 DIAGNOSIS — E43 Unspecified severe protein-calorie malnutrition: Secondary | ICD-10-CM | POA: Diagnosis not present

## 2015-08-12 DIAGNOSIS — Z930 Tracheostomy status: Secondary | ICD-10-CM | POA: Diagnosis not present

## 2015-08-12 DIAGNOSIS — Z9861 Coronary angioplasty status: Secondary | ICD-10-CM | POA: Diagnosis not present

## 2015-08-12 DIAGNOSIS — D649 Anemia, unspecified: Secondary | ICD-10-CM | POA: Diagnosis not present

## 2015-08-12 DIAGNOSIS — E872 Acidosis: Secondary | ICD-10-CM | POA: Diagnosis present

## 2015-08-12 DIAGNOSIS — I959 Hypotension, unspecified: Secondary | ICD-10-CM | POA: Diagnosis not present

## 2015-08-12 DIAGNOSIS — M4628 Osteomyelitis of vertebra, sacral and sacrococcygeal region: Secondary | ICD-10-CM | POA: Diagnosis present

## 2015-08-12 DIAGNOSIS — I5032 Chronic diastolic (congestive) heart failure: Secondary | ICD-10-CM | POA: Diagnosis present

## 2015-08-12 DIAGNOSIS — K626 Ulcer of anus and rectum: Secondary | ICD-10-CM | POA: Diagnosis not present

## 2015-08-12 DIAGNOSIS — B952 Enterococcus as the cause of diseases classified elsewhere: Secondary | ICD-10-CM | POA: Diagnosis present

## 2015-08-12 DIAGNOSIS — E11319 Type 2 diabetes mellitus with unspecified diabetic retinopathy without macular edema: Secondary | ICD-10-CM | POA: Diagnosis present

## 2015-08-12 DIAGNOSIS — Z8619 Personal history of other infectious and parasitic diseases: Secondary | ICD-10-CM | POA: Diagnosis not present

## 2015-08-12 DIAGNOSIS — E1122 Type 2 diabetes mellitus with diabetic chronic kidney disease: Secondary | ICD-10-CM | POA: Diagnosis present

## 2015-08-12 DIAGNOSIS — T85598A Other mechanical complication of other gastrointestinal prosthetic devices, implants and grafts, initial encounter: Secondary | ICD-10-CM

## 2015-08-12 DIAGNOSIS — Z6833 Body mass index (BMI) 33.0-33.9, adult: Secondary | ICD-10-CM | POA: Diagnosis not present

## 2015-08-12 DIAGNOSIS — Z8249 Family history of ischemic heart disease and other diseases of the circulatory system: Secondary | ICD-10-CM

## 2015-08-12 DIAGNOSIS — R4182 Altered mental status, unspecified: Secondary | ICD-10-CM

## 2015-08-12 DIAGNOSIS — J9601 Acute respiratory failure with hypoxia: Secondary | ICD-10-CM | POA: Diagnosis not present

## 2015-08-12 DIAGNOSIS — T85898A Other specified complication of other internal prosthetic devices, implants and grafts, initial encounter: Secondary | ICD-10-CM | POA: Diagnosis not present

## 2015-08-12 DIAGNOSIS — A155 Tuberculosis of larynx, trachea and bronchus: Secondary | ICD-10-CM | POA: Diagnosis not present

## 2015-08-12 DIAGNOSIS — I471 Supraventricular tachycardia: Secondary | ICD-10-CM | POA: Diagnosis not present

## 2015-08-12 DIAGNOSIS — A419 Sepsis, unspecified organism: Secondary | ICD-10-CM | POA: Diagnosis not present

## 2015-08-12 DIAGNOSIS — R4189 Other symptoms and signs involving cognitive functions and awareness: Secondary | ICD-10-CM | POA: Insufficient documentation

## 2015-08-12 DIAGNOSIS — E11649 Type 2 diabetes mellitus with hypoglycemia without coma: Secondary | ICD-10-CM | POA: Diagnosis present

## 2015-08-12 DIAGNOSIS — E2740 Unspecified adrenocortical insufficiency: Secondary | ICD-10-CM | POA: Diagnosis present

## 2015-08-12 DIAGNOSIS — E875 Hyperkalemia: Secondary | ICD-10-CM | POA: Diagnosis present

## 2015-08-12 DIAGNOSIS — Y731 Therapeutic (nonsurgical) and rehabilitative gastroenterology and urology devices associated with adverse incidents: Secondary | ICD-10-CM | POA: Diagnosis not present

## 2015-08-12 DIAGNOSIS — R0902 Hypoxemia: Secondary | ICD-10-CM

## 2015-08-12 HISTORY — DX: Dependence on renal dialysis: Z99.2

## 2015-08-12 HISTORY — DX: End stage renal disease: N18.6

## 2015-08-12 LAB — CBC WITH DIFFERENTIAL/PLATELET
BASOS ABS: 0 10*3/uL (ref 0–0.1)
Eosinophils Absolute: 0 10*3/uL (ref 0–0.7)
Eosinophils Relative: 0 %
HEMATOCRIT: 20.5 % — AB (ref 35.0–47.0)
HEMOGLOBIN: 6.4 g/dL — AB (ref 12.0–16.0)
Lymphs Abs: 1.3 10*3/uL (ref 1.0–3.6)
MCH: 29.6 pg (ref 26.0–34.0)
MCHC: 31.1 g/dL — ABNORMAL LOW (ref 32.0–36.0)
MCV: 95.5 fL (ref 80.0–100.0)
MONO ABS: 0.6 10*3/uL (ref 0.2–0.9)
Monocytes Relative: 4 %
NEUTROS ABS: 13.8 10*3/uL — AB (ref 1.4–6.5)
Platelets: 52 10*3/uL — ABNORMAL LOW (ref 150–440)
RBC: 2.15 MIL/uL — AB (ref 3.80–5.20)
RDW: 17.1 % — AB (ref 11.5–14.5)
WBC: 15.7 10*3/uL — AB (ref 3.6–11.0)

## 2015-08-12 LAB — COMPREHENSIVE METABOLIC PANEL
ALBUMIN: 1.6 g/dL — AB (ref 3.5–5.0)
ALK PHOS: 175 U/L — AB (ref 38–126)
ALT: 13 U/L — ABNORMAL LOW (ref 14–54)
ANION GAP: 8 (ref 5–15)
AST: 16 U/L (ref 15–41)
BILIRUBIN TOTAL: 0.9 mg/dL (ref 0.3–1.2)
BUN: 17 mg/dL (ref 6–20)
CALCIUM: 7.1 mg/dL — AB (ref 8.9–10.3)
CO2: 30 mmol/L (ref 22–32)
Chloride: 103 mmol/L (ref 101–111)
Creatinine, Ser: 1.29 mg/dL — ABNORMAL HIGH (ref 0.44–1.00)
GFR, EST AFRICAN AMERICAN: 55 mL/min — AB (ref >60)
GFR, EST NON AFRICAN AMERICAN: 47 mL/min — AB (ref >60)
Glucose, Bld: 87 mg/dL (ref 65–99)
POTASSIUM: 2.3 mmol/L — AB (ref 3.5–5.1)
Sodium: 141 mmol/L (ref 135–145)
TOTAL PROTEIN: 4.4 g/dL — AB (ref 6.5–8.1)

## 2015-08-12 LAB — BLOOD GAS, ARTERIAL
ACID-BASE EXCESS: 8.8 mmol/L — AB (ref 0.0–3.0)
Acid-Base Excess: 7 mmol/L — ABNORMAL HIGH (ref 0.0–3.0)
Bicarbonate: 32.8 mEq/L — ABNORMAL HIGH (ref 21.0–28.0)
Bicarbonate: 31.3 mEq/L — ABNORMAL HIGH (ref 21.0–28.0)
FIO2: 0.4
FIO2: 30
O2 SAT: 98.9 %
O2 Saturation: 99.5 %
PATIENT TEMPERATURE: 37
PCO2 ART: 42 mmHg (ref 32.0–48.0)
PCO2 ART: 42 mmHg (ref 32.0–48.0)
PH ART: 7.5 — AB (ref 7.350–7.450)
Patient temperature: 37
pH, Arterial: 7.48 — ABNORMAL HIGH (ref 7.350–7.450)
pO2, Arterial: 157 mmHg — ABNORMAL HIGH (ref 83.0–108.0)
pO2, Arterial: 121 mmHg — ABNORMAL HIGH (ref 83.0–108.0)

## 2015-08-12 LAB — LACTIC ACID, PLASMA
LACTIC ACID, VENOUS: 1.5 mmol/L (ref 0.5–2.0)
Lactic Acid, Venous: 1.2 mmol/L (ref 0.5–2.0)

## 2015-08-12 LAB — GLUCOSE, CAPILLARY
GLUCOSE-CAPILLARY: 72 mg/dL (ref 65–99)
GLUCOSE-CAPILLARY: 97 mg/dL (ref 65–99)
GLUCOSE-CAPILLARY: 96 mg/dL (ref 65–99)
GLUCOSE-CAPILLARY: 117 mg/dL — AB (ref 65–99)

## 2015-08-12 LAB — SEDIMENTATION RATE: SED RATE: 72 mm/h — AB (ref 0–30)

## 2015-08-12 LAB — TROPONIN I: Troponin I: 0.22 ng/mL — ABNORMAL HIGH (ref <0.031)

## 2015-08-12 LAB — MAGNESIUM: Magnesium: 1.4 mg/dL — ABNORMAL LOW (ref 1.7–2.4)

## 2015-08-12 LAB — MRSA PCR SCREENING: MRSA by PCR: NEGATIVE

## 2015-08-12 LAB — ABO/RH: ABO/RH(D): A NEG

## 2015-08-12 LAB — PREPARE RBC (CROSSMATCH)

## 2015-08-12 MED ORDER — LEVOFLOXACIN IN D5W 750 MG/150ML IV SOLN
750.0000 mg | INTRAVENOUS | Status: DC
  Administered 2015-08-12: 750 mg via INTRAVENOUS
  Filled 2015-08-12 (×2): qty 150

## 2015-08-12 MED ORDER — HEPARIN SODIUM (PORCINE) 5000 UNIT/ML IJ SOLN: 5000.0000 [IU] | Freq: Three times a day (TID) | INTRAMUSCULAR | Status: DC

## 2015-08-12 MED ORDER — POTASSIUM CHLORIDE 10 MEQ/100ML IV SOLN
10.0000 meq | INTRAVENOUS | Status: AC
  Administered 2015-08-12 (×6): 10 meq via INTRAVENOUS
  Filled 2015-08-12 (×6): qty 100

## 2015-08-12 MED ORDER — AZTREONAM 2 G IJ SOLR: 2.0000 g | Freq: Three times a day (TID) | INTRAMUSCULAR | Status: DC

## 2015-08-12 MED ORDER — ALTEPLASE 2 MG IJ SOLR
2.0000 mg | Freq: Once | INTRAMUSCULAR | Status: AC
  Administered 2015-08-12: 2 mg
  Filled 2015-08-12: qty 2

## 2015-08-12 MED ORDER — VANCOMYCIN HCL IN DEXTROSE 750-5 MG/150ML-% IV SOLN
750.0000 mg | INTRAVENOUS | Status: DC
  Administered 2015-08-13: 750 mg via INTRAVENOUS
  Filled 2015-08-12: qty 150

## 2015-08-12 MED ORDER — NOREPINEPHRINE 4 MG/250ML-% IV SOLN
4.0000 ug/min | INTRAVENOUS | Status: DC
  Administered 2015-08-12: 4 ug/min via INTRAVENOUS
  Administered 2015-08-12: 12 ug/min via INTRAVENOUS
  Filled 2015-08-12 (×2): qty 250

## 2015-08-12 MED ORDER — MAGNESIUM SULFATE 2 GM/50ML IV SOLN: 2.0000 g | Freq: Once | INTRAVENOUS | Status: DC

## 2015-08-12 MED ORDER — INSULIN ASPART 100 UNIT/ML ~~LOC~~ SOLN: 0.0000 [IU] | Freq: Every day | SUBCUTANEOUS | Status: DC

## 2015-08-12 MED ORDER — SODIUM CHLORIDE 0.9 % IV SOLN: INTRAVENOUS | Status: DC | PRN

## 2015-08-12 MED ORDER — SODIUM CHLORIDE 0.9 % IV BOLUS (SEPSIS)
1000.0000 mL | Freq: Once | INTRAVENOUS | Status: AC
  Administered 2015-08-12: 1000 mL via INTRAVENOUS

## 2015-08-12 MED ORDER — DEXTROSE 5 % IV SOLN
1.0000 g | INTRAVENOUS | Status: DC
  Filled 2015-08-12: qty 1

## 2015-08-12 MED ORDER — MEROPENEM 1 G IV SOLR
1.0000 g | Freq: Three times a day (TID) | INTRAVENOUS | Status: DC
  Filled 2015-08-12 (×3): qty 1

## 2015-08-12 MED ORDER — STERILE WATER FOR INJECTION IJ SOLN
INTRAMUSCULAR | Status: AC
  Administered 2015-08-12: 10 mL
  Filled 2015-08-12: qty 10

## 2015-08-12 MED ORDER — SODIUM CHLORIDE 0.9 % IJ SOLN
3.0000 mL | Freq: Two times a day (BID) | INTRAMUSCULAR | Status: DC
  Administered 2015-08-12 – 2015-08-17 (×9): 3 mL via INTRAVENOUS

## 2015-08-12 MED ORDER — SODIUM CHLORIDE 0.9 % IV SOLN: Freq: Once | INTRAVENOUS | Status: DC

## 2015-08-12 MED ORDER — VANCOMYCIN HCL IN DEXTROSE 750-5 MG/150ML-% IV SOLN
750.0000 mg | Freq: Two times a day (BID) | INTRAVENOUS | Status: DC
  Filled 2015-08-12 (×2): qty 150

## 2015-08-12 MED ORDER — SODIUM CHLORIDE 0.9 % IV SOLN
1000.0000 mg | Freq: Once | INTRAVENOUS | Status: AC
  Administered 2015-08-12: 1000 mg via INTRAVENOUS
  Filled 2015-08-12: qty 10

## 2015-08-12 MED ORDER — HYDROCORTISONE SOD SUCCINATE 100 MG PF FOR IT USE
100.0000 mg | Freq: Three times a day (TID) | INTRAMUSCULAR | Status: DC
  Filled 2015-08-12 (×4): qty 1

## 2015-08-12 MED ORDER — DEXTROSE 5 % IV SOLN
2.0000 g | Freq: Three times a day (TID) | INTRAVENOUS | Status: DC
  Filled 2015-08-12 (×2): qty 2

## 2015-08-12 MED ORDER — VANCOMYCIN HCL IN DEXTROSE 750-5 MG/150ML-% IV SOLN
750.0000 mg | Freq: Once | INTRAVENOUS | Status: AC
  Administered 2015-08-12: 750 mg via INTRAVENOUS
  Filled 2015-08-12: qty 150

## 2015-08-12 MED ORDER — ASPIRIN 300 MG RE SUPP
300.0000 mg | Freq: Every day | RECTAL | Status: DC
  Administered 2015-08-12: 300 mg via RECTAL
  Filled 2015-08-12: qty 1

## 2015-08-12 MED ORDER — DEXTROSE 5 % IV SOLN
2.0000 g | Freq: Once | INTRAVENOUS | Status: AC
  Administered 2015-08-12: 2 g via INTRAVENOUS
  Filled 2015-08-12: qty 2

## 2015-08-12 MED ORDER — LORAZEPAM 2 MG/ML IJ SOLN
0.5000 mg | Freq: Once | INTRAMUSCULAR | Status: AC
  Administered 2015-08-12: 0.5 mg via INTRAVENOUS
  Filled 2015-08-12: qty 1

## 2015-08-12 MED ORDER — DEXTROSE 5 % IV SOLN
2.0000 g | Freq: Three times a day (TID) | INTRAVENOUS | Status: DC
  Filled 2015-08-12 (×3): qty 2

## 2015-08-12 MED ORDER — MAGNESIUM SULFATE 2 GM/50ML IV SOLN
2.0000 g | Freq: Once | INTRAVENOUS | Status: AC
  Administered 2015-08-12: 2 g via INTRAVENOUS
  Filled 2015-08-12: qty 50

## 2015-08-12 MED ORDER — MEROPENEM 500 MG IV SOLR
500.0000 mg | INTRAVENOUS | Status: DC
  Administered 2015-08-12 – 2015-08-13 (×2): 500 mg via INTRAVENOUS
  Filled 2015-08-12 (×3): qty 0.5

## 2015-08-12 MED ORDER — POTASSIUM CHLORIDE 20 MEQ PO PACK: 40.0000 meq | PACK | Freq: Once | ORAL | Status: DC

## 2015-08-12 MED ORDER — INSULIN ASPART 100 UNIT/ML ~~LOC~~ SOLN
0.0000 [IU] | SUBCUTANEOUS | Status: DC
  Administered 2015-08-13 (×2): 2 [IU] via SUBCUTANEOUS
  Filled 2015-08-12 (×2): qty 2

## 2015-08-12 MED ORDER — NOREPINEPHRINE BITARTRATE 1 MG/ML IV SOLN
0.0000 ug/min | INTRAVENOUS | Status: DC
  Administered 2015-08-12: 5 ug/min via INTRAVENOUS
  Administered 2015-08-12: 6 ug/min via INTRAVENOUS
  Administered 2015-08-12: 4 ug/min via INTRAVENOUS
  Administered 2015-08-13: 3 ug/min via INTRAVENOUS
  Filled 2015-08-12 (×3): qty 16

## 2015-08-12 MED ORDER — INSULIN ASPART 100 UNIT/ML ~~LOC~~ SOLN: 0.0000 [IU] | Freq: Three times a day (TID) | SUBCUTANEOUS | Status: DC

## 2015-08-12 MED ORDER — VANCOMYCIN HCL IN DEXTROSE 1-5 GM/200ML-% IV SOLN
1000.0000 mg | Freq: Once | INTRAVENOUS | Status: AC
Start: 2015-08-12 — End: 2015-08-12
  Administered 2015-08-12: 1000 mg via INTRAVENOUS
  Filled 2015-08-12: qty 200

## 2015-08-12 MED ORDER — AZTREONAM 1 G IJ SOLR: 1.0000 g | Freq: Three times a day (TID) | INTRAMUSCULAR | Status: DC

## 2015-08-12 MED ORDER — HYDROCORTISONE NA SUCCINATE PF 100 MG IJ SOLR
50.0000 mg | Freq: Four times a day (QID) | INTRAMUSCULAR | Status: DC
  Administered 2015-08-12 – 2015-08-13 (×3): 50 mg via INTRAVENOUS
  Filled 2015-08-12 (×3): qty 2

## 2015-08-12 MED ORDER — LEVOFLOXACIN IN D5W 250 MG/50ML IV SOLN: 250.0000 mg | INTRAVENOUS | Status: DC

## 2015-08-12 MED ORDER — DAKINS (1/4 STRENGTH) 0.125 % EX SOLN
Freq: Three times a day (TID) | CUTANEOUS | Status: DC
  Administered 2015-08-12 – 2015-08-17 (×13)
  Administered 2015-08-17 (×2): 1
  Administered 2015-08-18: 14:00:00
  Filled 2015-08-12 (×4): qty 473

## 2015-08-12 MED ORDER — ALTEPLASE 100 MG IV SOLR
2.0000 mg | Freq: Once | INTRAVENOUS | Status: DC
  Filled 2015-08-12: qty 2

## 2015-08-12 MED ORDER — DEXTROSE 5 % IV SOLN: 4.0000 ug/min | INTRAVENOUS | Status: DC

## 2015-08-12 MED ORDER — HYDROCORTISONE NA SUCCINATE PF 100 MG IJ SOLR
100.0000 mg | Freq: Three times a day (TID) | INTRAMUSCULAR | Status: DC
  Administered 2015-08-12: 100 mg via INTRAVENOUS
  Filled 2015-08-12: qty 2

## 2015-08-12 MED ORDER — SODIUM CHLORIDE 0.9 % IV SOLN
500.0000 mg | Freq: Three times a day (TID) | INTRAVENOUS | Status: DC
  Filled 2015-08-12 (×3): qty 0.5

## 2015-08-12 NOTE — ED Provider Notes (Signed)
[facility] Emergency Department Provider Note  ____________________________________________  Time seen: On arrival at 842 a.m.  I have reviewed the triage vital signs and the nursing notes.   HISTORY  Chief Complaint unresponsive   hypotensive    HPI Courtney [name] is a 49 y.o. female with a complex past medical history, including dialysis, who was sent to the emergency department from the same day procedure area of [facility] due to the patient being unresponsive and hypotensive.  She had arrived at same day procedure in order to receive a one unit blood transfusion that was ordered by Dr. Thedore Mins, nephrology.  There is a transport person from [facility], which is where the patient usually stays, with the patient. She reports that she saw the patient yesterday when they picked her up from dialysis and again this morning. She does not know the patient well. She reports that yesterday, when they picked her up from dialysis, she was not very alert and was not interactive. The patient was mumbling under her breath at that time, "Lord, don't leave me", and continued to monitor in that manner when she was dropped off at [facility]. This morning, when the attendant went to assist with transport to [facility] for the blood transfusion, the patient was not responsive or verbal. She needed to be lifted out of the bed with a left system and placed into a Geri chair for transport to the hospital. The staff from the same day procedure area escorted the patient down to the emergency department and provided history here, reporting that she was hypotensive with blood pressure of 87/49 and unresponsive.  Her blood sugar in PACU was 79.  The patient is unresponsive and unable to provide any information.     Past Medical History  Diagnosis Date  . Obesity   . Dyslipidemia   . Hypertension   . Coronary artery disease     s/p BMS 2010 LAD  .  Dysrhythmia     ventricular tachycardia resolved after LAD stent and beta blocker  . Diabetes mellitus     with retinopathy, neuropathy and microalbuminemia    Patient Active Problem List   Diagnosis Date Noted  . Acute on chronic respiratory failure 06/27/2015  . Tracheostomy status 06/27/2015  . Paroxysmal ventricular tachycardia-non sustained  01/28/2012  . CAD (coronary artery disease) prior LAD BMS 01/28/2012  . Renal insufficiency 01/28/2012  . Chronic diastolic heart failure 01/28/2012    Past Surgical History  Procedure Laterality Date  . Cholecystectomy  1990  . Pci lad  12/2010  . Colonoscopy  08/2011  . Left heart catheterization with coronary angiogram N/A 01/28/2012    Procedure: LEFT HEART CATHETERIZATION WITH CORONARY ANGIOGRAM;  Surgeon: Lesleigh Noe, MD;  Location: [facility] CATH LAB;  Service: Cardiovascular;  Laterality: N/A;    Current Outpatient Rx  Name  Route  Sig  Dispense  Refill  . amLODipine-olmesartan (AZOR) 5-20 MG per tablet   Oral   Take 1 tablet by mouth daily.         Marland Kitchen aspirin 325 MG tablet   Oral   Take 325 mg by mouth daily.         . ferrous sulfate 325 (65 FE) MG tablet   Oral   Take 325 mg by mouth 2 (two) times daily with a meal.         . fish oil-omega-3 fatty acids 1000 MG capsule   Oral   Take 2  g by mouth daily.         Marland Kitchen gabapentin (NEURONTIN) 300 MG capsule   Oral   Take 300 mg by mouth 2 (two) times daily.         . insulin glargine (LANTUS) 100 UNIT/ML injection   Subcutaneous   Inject 50 Units into the skin at bedtime.          . metFORMIN (GLUCOPHAGE) 1000 MG tablet   Oral   Take 1,000 mg by mouth 2 (two) times daily with a meal.         . polyethylene glycol (MIRALAX / GLYCOLAX) packet   Oral   Take 17 g by mouth daily.         . simvastatin (ZOCOR) 40 MG tablet   Oral   Take 40 mg by mouth every evening.           Allergies Contrast media and Ampicillin  Family History  Problem  Relation Age of Onset  . Hypertension Father   . Diabetes Father   . Cancer Mother   . Diabetes Paternal Grandfather   . Hypertension Paternal Grandfather   . Diabetes Paternal Grandmother   . Hypertension Paternal Grandmother   . Cancer Maternal Grandmother     colon ca    Social History Social History  Substance Use Topics  . Smoking status: Never Smoker   . Smokeless tobacco: Never Used  . Alcohol Use: Yes     Comment: occassional    Review of Systems Review of systems not possible due to the patient's medical condition-nonverbal and unresponsive. ____________________________________________   PHYSICAL EXAM:  VITAL SIGNS: ED Triage Vitals  Enc Vitals Group     BP --      Pulse --      Resp --      Temp --      Temp src --      SpO2 --      Weight --      Height --      Head Cir --      [place] --      Pain Score --      Pain Loc --      Pain Edu? --      Excl. in GC? --     Constitutional:  Unresponsive with eyes closed. No response to noxious stimuli. ENT   Head: Normocephalic and atraumatic.   Nose: No congestion/rhinnorhea.   Mouth/Throat: Mucous membranes are moist.      Eyes: Pupils are 2-3 mm. The patient maintains a gaze to the left. Neck: Patient has a trach in place that appears to be functionally normally. Cardiovascular: Normal rate, regular rhythm, no murmur noted Respiratory:  Normal respiratory effort, no tachypnea.    Breath sounds are clear and equal bilaterally.  Gastrointestinal: Soft. No distention.  Musculoskeletal: Notable atrophy to legs. No deformities noted.  Neurologic: Unresponsive to noxious stimuli. Left gaze on exam area Skin:  Skin is warm, dry. There is a decubitus ulcer present on her sacrum. Psychiatric: Unresponsive ____________________________________________    LABS (pertinent positives/negatives)  Labs Reviewed  COMPREHENSIVE METABOLIC PANEL - Abnormal; Notable for the following:    Potassium 2.3  (*)    Creatinine, Ser 1.29 (*)    Calcium 7.1 (*)    Total Protein 4.4 (*)    Albumin 1.6 (*)    ALT 13 (*)    Alkaline Phosphatase 175 (*)    GFR calc non Af Amer 47 (*)  GFR calc Af Amer 55 (*)    All other components within normal limits  CBC WITH DIFFERENTIAL/PLATELET - Abnormal; Notable for the following:    WBC 15.7 (*)    RBC 2.15 (*)    Hemoglobin 6.4 (*)    HCT 20.5 (*)    MCHC 31.1 (*)    RDW 17.1 (*)    Platelets 52 (*)    Neutro Abs 13.8 (*)    All other components within normal limits  CULTURE, BLOOD (ROUTINE X 2)  CULTURE, BLOOD (ROUTINE X 2)  URINE CULTURE  CULTURE, BLOOD (SINGLE)  LACTIC ACID, PLASMA  LACTIC ACID, PLASMA  URINALYSIS COMPLETEWITH MICROSCOPIC ([facility] ONLY)  TROPONIN I  BLOOD GAS, ARTERIAL  MAGNESIUM  POTASSIUM     ____________________________________________   EKG  ED ECG REPORT I, KAMINSKI,DAVID W, the attending physician, personally viewed and interpreted this ECG.   Date: 08/05/2015  EKG Time: 8:39 AM  Rate: 90  Rhythm: Sinus rhythm with PVCs  Axis: Normal  Intervals: QTC prolonged at 579  ST&T Change: None noted   ____________________________________________    RADIOLOGY  CT head: IMPRESSION: Negative CT head.    Chest x-ray: IMPRESSION: 1. Tracheostomy tube and central venous lines are stable as detailed above. 2. No acute cardiopulmonary disease.   ____________________________________________   PROCEDURES CRITICAL CARE Performed by: Darien Ramus   Total critical care time: 40 minutes due to the critical nature of this patient's condition, including hypotensive, unresponsive, fixed left gaze. This time included treating evaluating the patient as well as speaking with consultants.  Critical care time was exclusive of separately billable procedures and treating other patients.  Critical care was necessary to treat or prevent imminent or life-threatening deterioration.  Critical care was time  spent personally by me on the following activities: development of treatment plan with patient and/or surrogate as well as nursing, discussions with consultants, evaluation of patient's response to treatment, examination of patient, obtaining history from patient or surrogate, ordering and performing treatments and interventions, ordering and review of laboratory studies, ordering and review of radiographic studies, pulse oximetry and re-evaluation of patient's condition.   ____________________________________________   INITIAL IMPRESSION / ASSESSMENT AND PLAN / ED COURSE  Pertinent labs & imaging results that were available during my care of the patient were reviewed by me and considered in my medical decision making (see chart for details).  Critically ill 49 year old female with borderline hypotension, but notably unresponsive with a fixed left gaze.  Differential diagnosis includes line sepsis due to her Vas-Cath, central line, and regular dialysis, status epilepticus, intracranial hemorrhage, and other possible disorders.  We will get a stat head CT. I will treat the possible seizure with 0.5 g of Ativan, keeping this does slow because of her hypotension, and Keppra, 1000 mg. She will receive 1500 mL of normal saline to help with her blood pressure. Blood cultures are being drawn from all assessable sites. We will initiate antibiotics.  ----------------------------------------- 9:15 AM on 07/27/2015 -----------------------------------------  The has been of Ms [name] just called. I reviewed the case with him. He understands that were covering a broad differential diagnosis. He will be arriving soon.  ----------------------------------------- 10:04 AM on 07/21/2015 -----------------------------------------  Blood tests show a normal lactic acid level within notably low potassium level at 2.3., Sodium of 141. Renal function is reasonable with a BUN of 17 and creatinine of 1.29. Her  white blood cell count of 15.7 with anemia with hemoglobin at 6.4.  ----------------------------------------- 10:15 AM on 07/22/2015 -----------------------------------------  I discussed the situation with the husband. He is helpful with a great deal of information about the patient, but he has some misunderstandings about the diagnostic capabilities of certain tests.  At this time the patient's left gaze has resolved. We have ordered potassium replacement. The patient's blood pressures improved. Antibiotics are pending. I have high suspicion for infection due to the decubitus ulcer that she has. I discussed the case with Dr. Hilton Sinclair for admission the hospital.   ____________________________________________   FINAL CLINICAL IMPRESSION(S) / ED DIAGNOSES  Final diagnoses:  Unresponsive  Hypokalemia  Hypotension, unspecified hypotension type  Decubitus ulcer      Darien Ramus, MD 07/27/2015 1022

## 2015-08-12 NOTE — Progress Notes (Addendum)
ANTIBIOTIC CONSULT NOTE - INITIAL  Pharmacy Consult for Vancomycin, Levaquin, and Ceftazidime Indication: Sepsis  Allergies  Allergen Reactions  . Contrast Media [Iodinated Diagnostic Agents] Anaphylaxis  . Ampicillin Rash    Patient Measurements: Height:  (172.7 cm) Weight: 190 lb (86.183 kg) IBW/kg (Calculated) : 63.9 Adjusted Body Weight: 72.8 kg  Vital Signs: Temp: 97.7 F (36.5 C) (09/23 0847) Temp Source: Oral (09/23 0847) BP: 77/31 mmHg (09/23 0847) Pulse Rate: 88 (09/23 0847) Intake/Output from previous day:   Intake/Output from this shift:    Labs:  Recent Labs  08-29-15 0850  CREATININE 1.29*   Estimated Creatinine Clearance: 60 mL/min (by C-G formula based on Cr of 1.29). No results for input(s): VANCOTROUGH, VANCOPEAK, VANCORANDOM, GENTTROUGH, GENTPEAK, GENTRANDOM, TOBRATROUGH, TOBRAPEAK, TOBRARND, AMIKACINPEAK, AMIKACINTROU, AMIKACIN in the last 72 hours.   Microbiology: Recent Results (from the past 720 hour(s))  Culture, blood (routine x 2)     Status: None   Collection Time: 08/01/15  7:00 PM  Result Value Ref Range Status   Specimen Description BLOOD RIGHT ANTECUBITAL  Final   Special Requests BOTTLES DRAWN AEROBIC ONLY 5CC  Final   Culture NO GROWTH 5 DAYS  Final   Report Status 08/06/2015 FINAL  Final  Culture, blood (routine x 2)     Status: None   Collection Time: 08/01/15  7:10 PM  Result Value Ref Range Status   Specimen Description BLOOD RIGHT HAND  Final   Special Requests IN PEDIATRIC BOTTLE 3CC  Final   Culture  Setup Time   Final    GRAM POSITIVE COCCI IN CLUSTERS AEROBIC BOTTLE ONLY CRITICAL RESULT CALLED TO, READ BACK BY AND VERIFIED WITH: D TAYLOR,RN AT 1115 08/02/15 BY L BENFIELD    Culture STAPHYLOCOCCUS SPECIES (COAGULASE NEGATIVE)  Final   Report Status 08/06/2015 FINAL  Final   Organism ID, Bacteria STAPHYLOCOCCUS SPECIES (COAGULASE NEGATIVE)  Final      Susceptibility   Staphylococcus species (coagulase negative) -  MIC*    CIPROFLOXACIN >=8 RESISTANT Resistant     ERYTHROMYCIN <=0.25 SENSITIVE Sensitive     GENTAMICIN 8 INTERMEDIATE Intermediate     OXACILLIN >=4 RESISTANT Resistant     TETRACYCLINE <=1 SENSITIVE Sensitive     VANCOMYCIN 1 SENSITIVE Sensitive     TRIMETH/SULFA 160 RESISTANT Resistant     CLINDAMYCIN <=0.25 SENSITIVE Sensitive     RIFAMPIN <=0.5 SENSITIVE Sensitive     Inducible Clindamycin NEGATIVE Sensitive     * STAPHYLOCOCCUS SPECIES (COAGULASE NEGATIVE)  Culture, blood (routine x 2)     Status: None   Collection Time: 08/02/15  7:00 PM  Result Value Ref Range Status   Specimen Description BLOOD RIGHT ANTECUBITAL  Final   Special Requests BOTTLES DRAWN AEROBIC ONLY 10CC  Final   Culture  Setup Time   Final    GRAM POSITIVE COCCI IN CLUSTERS AEROBIC BOTTLE ONLY CRITICAL RESULT CALLED TO, READ BACK BY AND VERIFIED WITH: DR Gwenevere Abbot RN 1549 08/03/15 A BROWNING    Culture   Final    STAPHYLOCOCCUS SPECIES (COAGULASE NEGATIVE) SUSCEPTIBILITIES PERFORMED ON PREVIOUS CULTURE WITHIN THE LAST 5 DAYS.    Report Status 08/06/2015 FINAL  Final  Culture, blood (routine x 2)     Status: None   Collection Time: 08/02/15  7:05 PM  Result Value Ref Range Status   Specimen Description BLOOD RIGHT HAND  Final   Special Requests BOTTLES DRAWN AEROBIC AND ANAEROBIC 10CC  Final   Culture NO GROWTH 5 DAYS  Final  Report Status 08/07/2015 FINAL  Final    Medical History: Past Medical History  Diagnosis Date  . Obesity   . Dyslipidemia   . Hypertension   . Coronary artery disease     s/p BMS 2010 LAD  . Dysrhythmia     ventricular tachycardia resolved after LAD stent and beta blocker  . Diabetes mellitus     with retinopathy, neuropathy and microalbuminemia    Medications:  Scheduled:   Assessment: 49 yo female admitted for sepsis.  Pharmacy consulted for vancomycin, levaquin, and ceftazidime dosing.  PK Parameters: Dosing Weight: 72.8kg Ke: 0.054 T1/2: 12 hours Vd: 51  L   Goal of Therapy:  Vancomycin trough level 15-20 mcg/ml  Plan:  Pt received one time doses of vancomycin and aztreonam in the ED Will start vancomycin  IV Q12H ~6 hours after first dose for stacked dosing.  Trough prior to 4th dose. Will start ceftazidime 2g IV Q8H and levofloxacin  IV Q24H.   Continue to monitor renal function, adjust doses as needed.  Follow up culture results   Pharmacy will continue to follow with you.   Jacqualyn Posey, PharmD Clinical Pharmacist 08-19-15,9:39 AM

## 2015-08-12 NOTE — Progress Notes (Signed)
Paged for Rapid Response. On arrival, staff in room with patient. Patient had been placed in [place]. Was told patient was from [facility] for blood transfusion. Taken out of [facility]. Placed on supplemental oxygen by trach collar. Normal saline infusing. Patient transported to ED on stretcher without episode.On arrival to the ED, staff at bedside. Report given by transferring staff.

## 2015-08-12 NOTE — Progress Notes (Signed)
   08/11/2015 0900  Clinical Encounter Type  Visited With Other (Comment);Family (Transportation provider from nursing home)  Visit Type ED  Referral From Chaplain  Consult/Referral To Chaplain  Spiritual Encounters  Spiritual Needs Prayer;Emotional  Chaplain visited patient who was unresponsive and transportation driver. Chaplain provided pastoral care for driver. Husband arrived sometime later visibly upset and desiring answers. He was offered support in which he stated he didn't need any and if it got to that point of support he would call in police to provide a realiable source of accurate unbiased truth between conversations.   Chaplain Brianna Headen [phone]

## 2015-08-12 NOTE — Consult Note (Signed)
CC: AMS  HPI: Jonet Suzzane Quilter is an 49 y.o. female  Nursing home resident with ESRD, decubitals presented with decreased s responsiveness and hypotension. Here in the emergency room with sternal rub she grimaced. Family at the bedside and able to give history. Pt has similar episodes in setting of hypotension and CO2 retention. EEG done, no acute epileptiform activity seen.    Past Medical History  Diagnosis Date  . Obesity   . Dyslipidemia   . Hypertension   . Coronary artery disease     s/p BMS 2010 LAD  . Dysrhythmia     ventricular tachycardia resolved after LAD stent and beta blocker  . Diabetes mellitus     with retinopathy, neuropathy and microalbuminemia  . ESRD (end stage renal disease) on dialysis     Past Surgical History  Procedure Laterality Date  . Cholecystectomy  1990  . Pci lad  12/2010  . Colonoscopy  08/2011  . Left heart catheterization with coronary angiogram N/A 01/28/2012    Procedure: LEFT HEART CATHETERIZATION WITH CORONARY ANGIOGRAM;  Surgeon: Lesleigh Noe, MD;  Location: [facility] CATH LAB;  Service: Cardiovascular;  Laterality: N/A;  . Tracheostomy    . Gastric bypass      Family History  Problem Relation Age of Onset  . Hypertension Father   . Diabetes Father   . Cancer Mother   . Diabetes Paternal Grandfather   . Hypertension Paternal Grandfather   . Diabetes Paternal Grandmother   . Hypertension Paternal Grandmother   . Cancer Maternal Grandmother     colon ca    Social History:  reports that she has never smoked. She has never used smokeless tobacco. She reports that she does not drink alcohol or use illicit drugs.  Allergies  Allergen Reactions  . Contrast Media [Iodinated Diagnostic Agents] Anaphylaxis  . Ampicillin Rash    Medications: I have reviewed the patient's current medications.  ROS: Unable to obtain  Physical Examination: Blood pressure 83/41, pulse 82, temperature 97.7 F (36.5 C), temperature source Oral,  resp. rate 23, height 5\' 8"  (1.727 m), weight 86.183 kg (190 lb), SpO2 100 %.  Disconjugate gaze,  Not following commands Withdrawal from painful stimuli b/l Pupils sluggish to respond.    Laboratory Studies:   Basic Metabolic Panel:  Recent Labs Lab 08/08/2015 0850  NA 141  K 2.3*  CL 103  CO2 30  GLUCOSE 87  BUN 17  CREATININE 1.29*  CALCIUM 7.1*  MG 1.4*    Liver Function Tests:  Recent Labs Lab 07/27/2015 0850  AST 16  ALT 13*  ALKPHOS 175*  BILITOT 0.9  PROT 4.4*  ALBUMIN 1.6*   No results for input(s): LIPASE, AMYLASE in the last 168 hours. No results for input(s): AMMONIA in the last 168 hours.  CBC:  Recent Labs Lab 07/29/2015 0850  WBC 15.7*  NEUTROABS 13.8*  HGB 6.4*  HCT 20.5*  MCV 95.5  PLT 52*    Cardiac Enzymes:  Recent Labs Lab 08/08/2015 0850  TROPONINI 0.22*    BNP: Invalid input(s): POCBNP  CBG:  Recent Labs Lab 08/02/2015 0814  GLUCAP 72    Microbiology: Results for orders placed or performed during the hospital encounter of 06/15/15  Culture, respiratory (NON-Expectorated)     Status: None   Collection Time: 06/16/15  3:15 PM  Result Value Ref Range Status   Specimen Description TRACHEAL ASPIRATE  Final   Special Requests NONE  Final   Gram Stain  Final    ABUNDANT WBC PRESENT,BOTH PMN AND MONONUCLEAR RARE SQUAMOUS EPITHELIAL CELLS PRESENT NO ORGANISMS SEEN Performed at [facility]    Culture   Final    FEW PSEUDOMONAS AERUGINOSA FEW STENOTROPHOMONAS MALTOPHILIA Performed at [facility]    Report Status 06/20/2015 FINAL  Final   Organism ID, Bacteria PSEUDOMONAS AERUGINOSA  Final   Organism ID, Bacteria STENOTROPHOMONAS MALTOPHILIA  Final      Susceptibility   Pseudomonas aeruginosa - MIC*    CEFEPIME 8 SENSITIVE Sensitive     CIPROFLOXACIN >=4 RESISTANT Resistant     GENTAMICIN 8 INTERMEDIATE Intermediate     IMIPENEM 2 SENSITIVE Sensitive     TOBRAMYCIN <=1 SENSITIVE Sensitive     * FEW  PSEUDOMONAS AERUGINOSA   Stenotrophomonas maltophilia - MIC*    TRIMETH/SULFA <=20 SENSITIVE Sensitive     LEVOFLOXACIN 1 SENSITIVE Sensitive     * FEW STENOTROPHOMONAS MALTOPHILIA  Culture, blood (routine x 2)     Status: None   Collection Time: 06/16/15  3:30 PM  Result Value Ref Range Status   Specimen Description BLOOD RIGHT HAND  Final   Special Requests IN PEDIATRIC BOTTLE 3CC  Final   Culture NO GROWTH 5 DAYS  Final   Report Status 06/21/2015 FINAL  Final  Culture, blood (routine x 2)     Status: None   Collection Time: 06/16/15  3:40 PM  Result Value Ref Range Status   Specimen Description BLOOD LEFT HAND  Final   Special Requests IN PEDIATRIC BOTTLE 3CC  Final   Culture NO GROWTH 5 DAYS  Final   Report Status 06/21/2015 FINAL  Final  Stat Gram stain     Status: None   Collection Time: 06/30/15  6:37 PM  Result Value Ref Range Status   Specimen Description URINE, RANDOM  Final   Special Requests NONE  Final   Gram Stain   Final    WBC PRESENT,BOTH PMN AND MONONUCLEAR GRAM NEGATIVE RODS CYTOSPIN Gram Stain Report Called to,Read Back By and Verified With: B OLOFINTUYI RN [id number] 06/30/15 A BROWNING    Report Status 06/30/2015 FINAL  Final  Culture, Urine     Status: None   Collection Time: 07/11/15  1:45 PM  Result Value Ref Range Status   Specimen Description URINE, RANDOM  Final   Special Requests NONE  Final   Culture MULTIPLE SPECIES PRESENT, SUGGEST RECOLLECTION  Final   Report Status 07/12/2015 FINAL  Final  Culture, blood (routine x 2)     Status: None   Collection Time: 07/11/15  3:20 PM  Result Value Ref Range Status   Specimen Description BLOOD RIGHT ARM  Final   Special Requests IN PEDIATRIC BOTTLE  4CC  Final   Culture NO GROWTH 5 DAYS  Final   Report Status 07/16/2015 FINAL  Final  Culture, blood (routine x 2)     Status: None   Collection Time: 07/11/15  3:25 PM  Result Value Ref Range Status   Specimen Description BLOOD PICC LINE  Final   Special  Requests BOTTLES DRAWN AEROBIC AND ANAEROBIC 10CC  Final   Culture NO GROWTH 5 DAYS  Final   Report Status 07/16/2015 FINAL  Final  Culture, blood (routine x 2)     Status: None   Collection Time: 08/01/15  7:00 PM  Result Value Ref Range Status   Specimen Description BLOOD RIGHT ANTECUBITAL  Final   Special Requests BOTTLES DRAWN AEROBIC ONLY 5CC  Final  Culture NO GROWTH 5 DAYS  Final   Report Status 08/06/2015 FINAL  Final  Culture, blood (routine x 2)     Status: None   Collection Time: 08/01/15  7:10 PM  Result Value Ref Range Status   Specimen Description BLOOD RIGHT HAND  Final   Special Requests IN PEDIATRIC BOTTLE 3CC  Final   Culture  Setup Time   Final    GRAM POSITIVE COCCI IN CLUSTERS AEROBIC BOTTLE ONLY CRITICAL RESULT CALLED TO, READ BACK BY AND VERIFIED WITH: D TAYLOR,RN AT 1115 08/02/15 BY L BENFIELD    Culture STAPHYLOCOCCUS SPECIES (COAGULASE NEGATIVE)  Final   Report Status 08/06/2015 FINAL  Final   Organism ID, Bacteria STAPHYLOCOCCUS SPECIES (COAGULASE NEGATIVE)  Final      Susceptibility   Staphylococcus species (coagulase negative) - MIC*    CIPROFLOXACIN >=8 RESISTANT Resistant     ERYTHROMYCIN <=0.25 SENSITIVE Sensitive     GENTAMICIN 8 INTERMEDIATE Intermediate     OXACILLIN >=4 RESISTANT Resistant     TETRACYCLINE <=1 SENSITIVE Sensitive     VANCOMYCIN 1 SENSITIVE Sensitive     TRIMETH/SULFA 160 RESISTANT Resistant     CLINDAMYCIN <=0.25 SENSITIVE Sensitive     RIFAMPIN <=0.5 SENSITIVE Sensitive     Inducible Clindamycin NEGATIVE Sensitive     * STAPHYLOCOCCUS SPECIES (COAGULASE NEGATIVE)  Culture, blood (routine x 2)     Status: None   Collection Time: 08/02/15  7:00 PM  Result Value Ref Range Status   Specimen Description BLOOD RIGHT ANTECUBITAL  Final   Special Requests BOTTLES DRAWN AEROBIC ONLY 10CC  Final   Culture  Setup Time   Final    GRAM POSITIVE COCCI IN CLUSTERS AEROBIC BOTTLE ONLY CRITICAL RESULT CALLED TO, READ BACK BY AND  VERIFIED WITH: DR Gwenevere Abbot RN 1549 08/03/15 A BROWNING    Culture   Final    STAPHYLOCOCCUS SPECIES (COAGULASE NEGATIVE) SUSCEPTIBILITIES PERFORMED ON PREVIOUS CULTURE WITHIN THE LAST 5 DAYS.    Report Status 08/06/2015 FINAL  Final  Culture, blood (routine x 2)     Status: None   Collection Time: 08/02/15  7:05 PM  Result Value Ref Range Status   Specimen Description BLOOD RIGHT HAND  Final   Special Requests BOTTLES DRAWN AEROBIC AND ANAEROBIC 10CC  Final   Culture NO GROWTH 5 DAYS  Final   Report Status 08/07/2015 FINAL  Final    Coagulation Studies: No results for input(s): LABPROT, INR in the last 72 hours.  Urinalysis: No results for input(s): COLORURINE, LABSPEC, PHURINE, GLUCOSEU, HGBUR, BILIRUBINUR, KETONESUR, PROTEINUR, UROBILINOGEN, NITRITE, LEUKOCYTESUR in the last 168 hours.  Invalid input(s): APPERANCEUR  Lipid Panel:  No results found for: CHOL, TRIG, HDL, CHOLHDL, VLDL, LDLCALC  HgbA1C:  Lab Results  Component Value Date   HGBA1C 4.3* 06/16/2015    Urine Drug Screen:  No results found for: LABOPIA, COCAINSCRNUR, LABBENZ, AMPHETMU, THCU, LABBARB  Alcohol Level: No results for input(s): ETH in the last 168 hours.  Other results: EKG: normal EKG, normal sinus rhythm, unchanged from previous tracings.  Imaging: Dg Chest 1 View  09-04-15   CLINICAL DATA:  Hypotension. Unresponsive patient with tracheostomy in place.  EXAM: CHEST 1 VIEW  COMPARISON:  08/02/2015  FINDINGS: Cardiac silhouette is top-normal in size. No mediastinal or hilar masses or evidence of adenopathy. Lungs are clear. No pleural effusion or pneumothorax.  Right internal jugular dual-lumen central venous catheter is stable with its distal tip in the right atrium. Left internal jugular small caliber  dual-lumen central venous catheter is stable with its tip in the mid superior vena cava.  Tracheostomy tube is well positioned and also stable.  IMPRESSION: 1. Tracheostomy tube and central venous lines  are stable as detailed above. 2. No acute cardiopulmonary disease.   Electronically Signed   By: Amie Portland M.D.   On: 08/07/2015 09:24   Ct Head Wo Contrast  08/01/2015   CLINICAL DATA:  Unresponsive patient.  Leftward gaze.  EXAM: CT HEAD WITHOUT CONTRAST  TECHNIQUE: Contiguous axial images were obtained from the base of the skull through the vertex without intravenous contrast.  COMPARISON:  MRI 01/28/2012.  FINDINGS: No mass lesion, mass effect, midline shift, hydrocephalus, hemorrhage. No territorial ischemia or acute infarction. Unchanged small dilated perivascular spaces are noted in the basal ganglia.  IMPRESSION: Negative CT head.   Electronically Signed   By: Andreas Newport M.D.   On: 07/22/2015 10:11     Assessment/Plan:  49 y.o. female  Nursing home resident with ESRD, decubitals presented with decreased s responsiveness and hypotension. Here in the emergency room with sternal rub she grimaced. Family at the bedside and able to give history. Pt has similar episodes in setting of hypotension and CO2 retention. EEG done, no acute epileptiform activity seen.     Mental status multifactorial with hypotension, metabolic encephalopathy in setting of CO2 retention and hypokelemia, anemia  as well as possible multiple sources of infection.  Likely septic shock- pan culture including central line. Antibiotics started Do not think related to seizure activity would hold anti epileptics.   S/p discussion with husband Pauletta Browns    08/14/2015, 1:24 PM

## 2015-08-12 NOTE — Consult Note (Signed)
ANTIBIOTIC CONSULT NOTE - FOLLOW UP  Pharmacy Consult for vancomycin Indication: sepsis  Allergies  Allergen Reactions  . Contrast Media [Iodinated Diagnostic Agents] Anaphylaxis  . Ampicillin Rash    Patient Measurements: Height:  (175.3 cm) Weight: 164 lb 6.4 oz (74.571 kg) IBW/kg (Calculated) : 66.2 Adjusted Body Weight:   Vital Signs: Temp: 97.5 F (36.4 C) (09/23 1700) Temp Source: Axillary (09/23 1700) BP: 96/66 mmHg (09/23 1800) Pulse Rate: 85 (09/23 1800) Intake/Output from previous day:   Intake/Output from this shift: Total I/O In: 628.3 [I.V.:128.3; IV Piggyback:500] Out: -   Labs:  Recent Labs  2015/08/31 0850  WBC 15.7*  HGB 6.4*  PLT 52*  CREATININE 1.29*   Estimated Creatinine Clearance: 54.5 mL/min (by C-G formula based on Cr of 1.29). No results for input(s): VANCOTROUGH, VANCOPEAK, VANCORANDOM, GENTTROUGH, GENTPEAK, GENTRANDOM, TOBRATROUGH, TOBRAPEAK, TOBRARND, AMIKACINPEAK, AMIKACINTROU, AMIKACIN in the last 72 hours.   Microbiology: Recent Results (from the past 720 hour(s))  Culture, blood (routine x 2)     Status: None   Collection Time: 08/01/15  7:00 PM  Result Value Ref Range Status   Specimen Description BLOOD RIGHT ANTECUBITAL  Final   Special Requests BOTTLES DRAWN AEROBIC ONLY 5CC  Final   Culture NO GROWTH 5 DAYS  Final   Report Status 08/06/2015 FINAL  Final  Culture, blood (routine x 2)     Status: None   Collection Time: 08/01/15  7:10 PM  Result Value Ref Range Status   Specimen Description BLOOD RIGHT HAND  Final   Special Requests IN PEDIATRIC BOTTLE 3CC  Final   Culture  Setup Time   Final    GRAM POSITIVE COCCI IN CLUSTERS AEROBIC BOTTLE ONLY CRITICAL RESULT CALLED TO, READ BACK BY AND VERIFIED WITH: D TAYLOR,RN AT 1115 08/02/15 BY L BENFIELD    Culture STAPHYLOCOCCUS SPECIES (COAGULASE NEGATIVE)  Final   Report Status 08/06/2015 FINAL  Final   Organism ID, Bacteria STAPHYLOCOCCUS SPECIES (COAGULASE NEGATIVE)   Final      Susceptibility   Staphylococcus species (coagulase negative) - MIC*    CIPROFLOXACIN >=8 RESISTANT Resistant     ERYTHROMYCIN <=0.25 SENSITIVE Sensitive     GENTAMICIN 8 INTERMEDIATE Intermediate     OXACILLIN >=4 RESISTANT Resistant     TETRACYCLINE <=1 SENSITIVE Sensitive     VANCOMYCIN 1 SENSITIVE Sensitive     TRIMETH/SULFA 160 RESISTANT Resistant     CLINDAMYCIN <=0.25 SENSITIVE Sensitive     RIFAMPIN <=0.5 SENSITIVE Sensitive     Inducible Clindamycin NEGATIVE Sensitive     * STAPHYLOCOCCUS SPECIES (COAGULASE NEGATIVE)  Culture, blood (routine x 2)     Status: None   Collection Time: 08/02/15  7:00 PM  Result Value Ref Range Status   Specimen Description BLOOD RIGHT ANTECUBITAL  Final   Special Requests BOTTLES DRAWN AEROBIC ONLY 10CC  Final   Culture  Setup Time   Final    GRAM POSITIVE COCCI IN CLUSTERS AEROBIC BOTTLE ONLY CRITICAL RESULT CALLED TO, READ BACK BY AND VERIFIED WITH: DR Gwenevere Abbot RN 1549 08/03/15 A BROWNING    Culture   Final    STAPHYLOCOCCUS SPECIES (COAGULASE NEGATIVE) SUSCEPTIBILITIES PERFORMED ON PREVIOUS CULTURE WITHIN THE LAST 5 DAYS.    Report Status 08/06/2015 FINAL  Final  Culture, blood (routine x 2)     Status: None   Collection Time: 08/02/15  7:05 PM  Result Value Ref Range Status   Specimen Description BLOOD RIGHT HAND  Final   Special Requests BOTTLES  DRAWN AEROBIC AND ANAEROBIC 10CC  Final   Culture NO GROWTH 5 DAYS  Final   Report Status 08/07/2015 FINAL  Final  MRSA PCR Screening     Status: None   Collection Time: 08/15/2015  2:38 PM  Result Value Ref Range Status   MRSA by PCR NEGATIVE NEGATIVE Final    Comment:        The GeneXpert MRSA Assay (FDA approved for NASAL specimens only), is one component of a comprehensive MRSA colonization surveillance program. It is not intended to diagnose MRSA infection nor to guide or monitor treatment for MRSA infections.     Anti-infectives    Start     Dose/Rate Route Frequency  Ordered Stop   08/13/15 1800  aztreonam (AZACTAM) 1 g in dextrose 5 % 50 mL IVPB     1 g 100 mL/hr over 30 Minutes Intravenous Every 24 hours 07/25/2015 1840     08/13/15 1200  vancomycin (VANCOCIN) IVPB 750 mg/150 ml premix     750 mg 150 mL/hr over 60 Minutes Intravenous Every T-Th-Sa (Hemodialysis) 08/06/2015 1849     08/13/15 0100  aztreonam (AZACTAM) 2 g in dextrose 5 % 50 mL IVPB  Status:  Discontinued     2 g 100 mL/hr over 30 Minutes Intravenous 3 times per day 08/01/2015 1820 08/15/2015 1822   08/18/2015 2200  cefTAZidime (FORTAZ) 2 g in dextrose 5 % 50 mL IVPB  Status:  Discontinued     2 g 100 mL/hr over 30 Minutes Intravenous 3 times per day 07/30/2015 1051 07/25/2015 1728   08/05/2015 2200  meropenem (MERREM) 1 g in sodium chloride 0.9 % 100 mL IVPB  Status:  Discontinued     1 g 200 mL/hr over 30 Minutes Intravenous 3 times per day 08/07/2015 1812 07/31/2015 1822   07/30/2015 2200  meropenem (MERREM) 500 mg in sodium chloride 0.9 % 50 mL IVPB     500 mg 100 mL/hr over 30 Minutes Intravenous 3 times per day 07/30/2015 1840     07/25/2015 1900  vancomycin (VANCOCIN) IVPB 750 mg/150 ml premix     750 mg 150 mL/hr over 60 Minutes Intravenous  Once 07/27/2015 1849     08/04/2015 1830  aztreonam (AZACTAM) injection 2 g  Status:  Discontinued     2 g Intramuscular 3 times per day 08/10/2015 1005 07/30/2015 1047   07/27/2015 1530  vancomycin (VANCOCIN) IVPB 750 mg/150 ml premix  Status:  Discontinued     750 mg 150 mL/hr over 60 Minutes Intravenous Every 12 hours 08/11/2015 1005 08/11/2015 1849   08/02/2015 1400  aztreonam (AZACTAM) 1 g in dextrose 5 % 50 mL IVPB  Status:  Discontinued     1 g 100 mL/hr over 30 Minutes Intravenous 3 times per day 07/29/2015 1048 08/19/2015 1051   07/23/2015 1130  levofloxacin (LEVAQUIN) IVPB 750 mg  Status:  Discontinued     750 mg 100 mL/hr over 90 Minutes Intravenous Every 24 hours 08/01/2015 1128 07/30/2015 1728   07/25/2015 1100  Levofloxacin (LEVAQUIN) IVPB 250 mg  Status:  Discontinued      250 mg 50 mL/hr over 60 Minutes Intravenous Every 24 hours 07/31/2015 1048 08/16/2015 1128   07/25/2015 0900  vancomycin (VANCOCIN) IVPB 1000 mg/200 mL premix     1,000 mg 200 mL/hr over 60 Minutes Intravenous  Once 07/30/2015 0849 08/03/2015 1108   07/21/2015 0900  aztreonam (AZACTAM) 2 g in dextrose 5 % 50 mL IVPB     2 g  100 mL/hr over 30 Minutes Intravenous  Once 08/13/2015 0849 07/31/2015 1108      Assessment: Pt is a 49 year old female in septic shock. Pharmacy was consulted to dose vancomycin. Pt received 1g in the ED today. Pt recieves HD q TTHSat.  Goal of Therapy:  Pre HD level 15-25 post HD level 5-15  Plan:  will give an extra  tonight for a total load of . Pt is to get  AFTER her dialysis sessions. will check level before 3rd dialysis.   Melissa D Maccia 08/08/2015,6:55 PM

## 2015-08-12 NOTE — Progress Notes (Signed)
Subjective:  Pt well known to [name] as [facility] provided care for her there. Quite sick at the moment.  Came to HD yesterday as outpt. Came here this AM for blood transfusion but blood pressure was quite low. Therefore pt transferred to ED.  Has been given aztreonam and vancomycin here. Has known sacral decubitus ulcer as well as two lines, one for dialysis.    Objective:  Vital signs: 98.3 69 18 121/66   Physical Exam: General: Critically ill appearing  HEENT Eyes closed, OM dry  Neck Trach in place, trach collar overlying  Pulm/lungs Scattered rhonchi   CVS/Heart S1S2 no rubs  Abdomen:  Soft, non tender, BS present  Extremities: Trace b/l LE edema  Neurologic: Very lethargic, difficult to arouse  Skin: Warm, no acute rashes  Access: Rt IJ PC       Basic Metabolic Panel:  Recent Labs Lab 08/13/2015 0850  NA 141  K 2.3*  CL 103  CO2 30  GLUCOSE 87  BUN 17  CREATININE 1.29*  CALCIUM 7.1*  MG 1.4*     CBC:  Recent Labs Lab 07/25/2015 0850  WBC 15.7*  NEUTROABS 13.8*  HGB 6.4*  HCT 20.5*  MCV 95.5  PLT 52*      Microbiology: Results for orders placed or performed during the hospital encounter of 06/15/15  Culture, respiratory (NON-Expectorated)     Status: None   Collection Time: 06/16/15  3:15 PM  Result Value Ref Range Status   Specimen Description TRACHEAL ASPIRATE  Final   Special Requests NONE  Final   Gram Stain   Final    ABUNDANT WBC PRESENT,BOTH PMN AND MONONUCLEAR RARE SQUAMOUS EPITHELIAL CELLS PRESENT NO ORGANISMS SEEN Performed at [facility]    Culture   Final    FEW PSEUDOMONAS AERUGINOSA FEW STENOTROPHOMONAS MALTOPHILIA Performed at [facility]    Report Status 06/20/2015 FINAL  Final   Organism ID, Bacteria PSEUDOMONAS AERUGINOSA  Final   Organism ID, Bacteria STENOTROPHOMONAS MALTOPHILIA  Final      Susceptibility   Pseudomonas aeruginosa - MIC*    CEFEPIME 8 SENSITIVE Sensitive    CIPROFLOXACIN >=4 RESISTANT Resistant     GENTAMICIN 8 INTERMEDIATE Intermediate     IMIPENEM 2 SENSITIVE Sensitive     TOBRAMYCIN <=1 SENSITIVE Sensitive     * FEW PSEUDOMONAS AERUGINOSA   Stenotrophomonas maltophilia - MIC*    TRIMETH/SULFA <=20 SENSITIVE Sensitive     LEVOFLOXACIN 1 SENSITIVE Sensitive     * FEW STENOTROPHOMONAS MALTOPHILIA  Culture, blood (routine x 2)     Status: None   Collection Time: 06/16/15  3:30 PM  Result Value Ref Range Status   Specimen Description BLOOD RIGHT HAND  Final   Special Requests IN PEDIATRIC BOTTLE 3CC  Final   Culture NO GROWTH 5 DAYS  Final   Report Status 06/21/2015 FINAL  Final  Culture, blood (routine x 2)     Status: None   Collection Time: 06/16/15  3:40 PM  Result Value Ref Range Status   Specimen Description BLOOD LEFT HAND  Final   Special Requests IN PEDIATRIC BOTTLE 3CC  Final   Culture NO GROWTH 5 DAYS  Final   Report Status 06/21/2015 FINAL  Final  Stat Gram stain     Status: None   Collection Time: 06/30/15  6:37 PM  Result Value Ref Range Status   Specimen Description URINE, RANDOM  Final   Special Requests NONE  Final  Gram Stain   Final    WBC PRESENT,BOTH PMN AND MONONUCLEAR GRAM NEGATIVE RODS CYTOSPIN Gram Stain Report Called to,Read Back By and Verified With: B OLOFINTUYI RN [id number] 06/30/15 A BROWNING    Report Status 06/30/2015 FINAL  Final  Culture, Urine     Status: None   Collection Time: 07/11/15  1:45 PM  Result Value Ref Range Status   Specimen Description URINE, RANDOM  Final   Special Requests NONE  Final   Culture MULTIPLE SPECIES PRESENT, SUGGEST RECOLLECTION  Final   Report Status 07/12/2015 FINAL  Final  Culture, blood (routine x 2)     Status: None   Collection Time: 07/11/15  3:20 PM  Result Value Ref Range Status   Specimen Description BLOOD RIGHT ARM  Final   Special Requests IN PEDIATRIC BOTTLE  4CC  Final   Culture NO GROWTH 5 DAYS  Final   Report Status 07/16/2015 FINAL  Final  Culture,  blood (routine x 2)     Status: None   Collection Time: 07/11/15  3:25 PM  Result Value Ref Range Status   Specimen Description BLOOD PICC LINE  Final   Special Requests BOTTLES DRAWN AEROBIC AND ANAEROBIC 10CC  Final   Culture NO GROWTH 5 DAYS  Final   Report Status 07/16/2015 FINAL  Final  Culture, blood (routine x 2)     Status: None   Collection Time: 08/01/15  7:00 PM  Result Value Ref Range Status   Specimen Description BLOOD RIGHT ANTECUBITAL  Final   Special Requests BOTTLES DRAWN AEROBIC ONLY 5CC  Final   Culture NO GROWTH 5 DAYS  Final   Report Status 08/06/2015 FINAL  Final  Culture, blood (routine x 2)     Status: None   Collection Time: 08/01/15  7:10 PM  Result Value Ref Range Status   Specimen Description BLOOD RIGHT HAND  Final   Special Requests IN PEDIATRIC BOTTLE 3CC  Final   Culture  Setup Time   Final    GRAM POSITIVE COCCI IN CLUSTERS AEROBIC BOTTLE ONLY CRITICAL RESULT CALLED TO, READ BACK BY AND VERIFIED WITH: D TAYLOR,RN AT 1115 08/02/15 BY L BENFIELD    Culture STAPHYLOCOCCUS SPECIES (COAGULASE NEGATIVE)  Final   Report Status 08/06/2015 FINAL  Final   Organism ID, Bacteria STAPHYLOCOCCUS SPECIES (COAGULASE NEGATIVE)  Final      Susceptibility   Staphylococcus species (coagulase negative) - MIC*    CIPROFLOXACIN >=8 RESISTANT Resistant     ERYTHROMYCIN <=0.25 SENSITIVE Sensitive     GENTAMICIN 8 INTERMEDIATE Intermediate     OXACILLIN >=4 RESISTANT Resistant     TETRACYCLINE <=1 SENSITIVE Sensitive     VANCOMYCIN 1 SENSITIVE Sensitive     TRIMETH/SULFA 160 RESISTANT Resistant     CLINDAMYCIN <=0.25 SENSITIVE Sensitive     RIFAMPIN <=0.5 SENSITIVE Sensitive     Inducible Clindamycin NEGATIVE Sensitive     * STAPHYLOCOCCUS SPECIES (COAGULASE NEGATIVE)  Culture, blood (routine x 2)     Status: None   Collection Time: 08/02/15  7:00 PM  Result Value Ref Range Status   Specimen Description BLOOD RIGHT ANTECUBITAL  Final   Special Requests BOTTLES DRAWN  AEROBIC ONLY 10CC  Final   Culture  Setup Time   Final    GRAM POSITIVE COCCI IN CLUSTERS AEROBIC BOTTLE ONLY CRITICAL RESULT CALLED TO, READ BACK BY AND VERIFIED WITH: DR Gwenevere Abbot RN 1549 08/03/15 A BROWNING    Culture   Final    STAPHYLOCOCCUS SPECIES (  COAGULASE NEGATIVE) SUSCEPTIBILITIES PERFORMED ON PREVIOUS CULTURE WITHIN THE LAST 5 DAYS.    Report Status 08/06/2015 FINAL  Final  Culture, blood (routine x 2)     Status: None   Collection Time: 08/02/15  7:05 PM  Result Value Ref Range Status   Specimen Description BLOOD RIGHT HAND  Final   Special Requests BOTTLES DRAWN AEROBIC AND ANAEROBIC 10CC  Final   Culture NO GROWTH 5 DAYS  Final   Report Status 08/07/2015 FINAL  Final    Coagulation Studies: No results for input(s): LABPROT, INR in the last 72 hours.  Urinalysis: No results for input(s): COLORURINE, LABSPEC, PHURINE, GLUCOSEU, HGBUR, BILIRUBINUR, KETONESUR, PROTEINUR, UROBILINOGEN, NITRITE, LEUKOCYTESUR in the last 72 hours.  Invalid input(s): APPERANCEUR    Imaging: Dg Chest 1 View  08/10/2015   CLINICAL DATA:  Hypotension. Unresponsive patient with tracheostomy in place.  EXAM: CHEST 1 VIEW  COMPARISON:  08/02/2015  FINDINGS: Cardiac silhouette is top-normal in size. No mediastinal or hilar masses or evidence of adenopathy. Lungs are clear. No pleural effusion or pneumothorax.  Right internal jugular dual-lumen central venous catheter is stable with its distal tip in the right atrium. Left internal jugular small caliber dual-lumen central venous catheter is stable with its tip in the mid superior vena cava.  Tracheostomy tube is well positioned and also stable.  IMPRESSION: 1. Tracheostomy tube and central venous lines are stable as detailed above. 2. No acute cardiopulmonary disease.   Electronically Signed   By: Amie Portland M.D.   On: 08/06/2015 09:24   Ct Head Wo Contrast  08/17/2015   CLINICAL DATA:  Unresponsive patient.  Leftward gaze.  EXAM: CT HEAD WITHOUT  CONTRAST  TECHNIQUE: Contiguous axial images were obtained from the base of the skull through the vertex without intravenous contrast.  COMPARISON:  MRI 01/28/2012.  FINDINGS: No mass lesion, mass effect, midline shift, hydrocephalus, hemorrhage. No territorial ischemia or acute infarction. Unchanged small dilated perivascular spaces are noted in the basal ganglia.  IMPRESSION: Negative CT head.   Electronically Signed   By: Andreas Newport M.D.   On: 08/15/2015 10:11    Results for [facility] NICHELLE, RENWICK (MRN [id number]) as of 07/18/2015 15:59  Ref. Range 07/15/2015 12:12  Hepatitis B Surface Ag Latest Ref Range: Negative  Negative   Medications:    Assessment/ Plan:  49 y.o. 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  1. End-stage renal disease on hemodialysis on HD TTHS. The patient has been on dialysis since October of 2014.  Pt had HD yesterday as an outpt, K actually low now, no acute indication for HD at the moment, will reassess for HD tomorrow if more stable.   2. Anemia of CKD:  hgb below 7 at the moment.  Would consider blood transfusion, would give at least one unit.  3. Secondary hyperparathyroidism: will check pth/phos with next HD.   4. Septic shock:  Multiple potential sources, has two catheters in place, also has a sacral decubitis, and also has trach in place.  Agree with aztreonam/vancomycin for now.  Would add pressors to maintain map of 65  or greater.    5.   Hypokalemia: K low at 2.3, pt to receive K repletion today.  LATEEF, MUNSOOR 09/10/1610:36 AM

## 2015-08-12 NOTE — Consult Note (Signed)
Patient ID: Courtney [name], female   DOB: 10/29/1966, 49 y.o.   MRN: [id number] Reason for Consult - Stage 4 pressure injury HPI Courtney [name] is a 49 y.o. female with multiple medical problems admitted to the medicine service for hypotension and decreased cognitive awareness. Patient would not communicate verbally with me during my consultation. Discussing with her significant other who was at the bedside he thinks that she is acting like she received too much pain medication during dialysis. Focusing on her multiple bedsores I asked him how long to been since he had visualized. He states he has seen numerous times and that outpatient wound care was using a chemical debridement over the past month. He states that he's been told numerous times over the past 2 years that the only way her large stage IV pressure sore would heal would be with a plastic surgery flap. He's also been told that no plastic surgeon would offer her a flap until she better recovers from her other multiple medical problems. He states that over the past 2 years she is been having fluctuating cognitive and physical abilities and that he thought that she was on the mend as she had finally made it to outpatient rehabilitation from her very prolonged inpatient status. This deterioration has been acute and he's not sure what is going on with her.  HPI  Past Medical History  Diagnosis Date  . Obesity   . Dyslipidemia   . Hypertension   . Coronary artery disease     s/p BMS 2010 LAD  . Dysrhythmia     ventricular tachycardia resolved after LAD stent and beta blocker  . Diabetes mellitus     with retinopathy, neuropathy and microalbuminemia  . ESRD (end stage renal disease) on dialysis     Past Surgical History  Procedure Laterality Date  . Cholecystectomy  1990  . Pci lad  12/2010  . Colonoscopy  08/2011  . Left heart catheterization with coronary angiogram N/A 01/28/2012    Procedure: LEFT HEART CATHETERIZATION  WITH CORONARY ANGIOGRAM;  Surgeon: Lesleigh Noe, MD;  Location: [facility] CATH LAB;  Service: Cardiovascular;  Laterality: N/A;  . Tracheostomy    . Gastric bypass      Family History  Problem Relation Age of Onset  . Hypertension Father   . Diabetes Father   . Cancer Mother   . Diabetes Paternal Grandfather   . Hypertension Paternal Grandfather   . Diabetes Paternal Grandmother   . Hypertension Paternal Grandmother   . Cancer Maternal Grandmother     colon ca    Social History Social History  Substance Use Topics  . Smoking status: Never Smoker   . Smokeless tobacco: Never Used  . Alcohol Use: No     Comment: occassional    Allergies  Allergen Reactions  . Contrast Media [Iodinated Diagnostic Agents] Anaphylaxis  . Ampicillin Rash    Current Facility-Administered Medications  Medication Dose Route Frequency Provider Last Rate Last Dose  . 0.9 %  sodium chloride infusion   Intravenous Once Alford Highland, MD   Stopped at 08/11/2015 1452  . aspirin suppository 300 mg  300 mg Rectal Daily Alford Highland, MD   300 mg at 08/15/2015 1603  . cefTAZidime (FORTAZ) 2 g in dextrose 5 % 50 mL IVPB  2 g Intravenous 3 times per day Alford Highland, MD      . hydrocortisone sodium succinate (SOLU-CORTEF) 100 MG injection 100 mg  100 mg Intravenous Q8H Richard [employer],  MD   100 mg at 2015/08/15 1604  . insulin aspart (novoLOG) injection 0-5 Units  0-5 Units Subcutaneous QHS Alford Highland, MD      . insulin aspart (novoLOG) injection 0-9 Units  0-9 Units Subcutaneous TID WC Alford Highland, MD   0 Units at Aug 15, 2015 1452  . levofloxacin (LEVAQUIN) IVPB 750 mg  750 mg Intravenous Q24H Alford Highland, MD      . magnesium sulfate IVPB 2 g 50 mL  2 g Intravenous Once Alford Highland, MD      . norepinephrine (LEVOPHED)  in D5W premix infusion  4 mcg/min Intravenous Titrated Alford Highland, MD 45 mL/hr at 08-15-2015 1603 12 mcg/min at 08-15-2015 1603  . potassium chloride (KLOR-CON) packet  40 mEq  40 mEq Oral Once Darien Ramus, MD   40 mEq at Aug 15, 2015 1454  . potassium chloride 10 mEq in 100 mL IVPB  10 mEq Intravenous Q1 Hr x 6 Darien Ramus, MD 100 mL/hr at 08-15-15 1556 10 mEq at 2015/08/15 1556  . sodium chloride 0.9 % bolus 1,000 mL  1,000 mL Intravenous Once Merwyn Katos, MD      . sodium chloride 0.9 % injection 3 mL  3 mL Intravenous Q12H Alford Highland, MD   3 mL at 15-Aug-2015 1440  . vancomycin (VANCOCIN) IVPB 750 mg/150 ml premix  750 mg Intravenous Q12H Darien Ramus, MD         Review of Systems A review of systems wasn't possible to complete the patient's lack of verbal responses  Physical Exam Blood pressure 100/64, pulse 82, temperature 97.7 F (36.5 C), temperature source Axillary, resp. rate 16, height  (1.753 m), weight 74.571 kg (164 lb 6.4 oz), SpO2 100 %. CONSTITUTIONAL: Laying in bed, appears comfortable, barely responds to sternal rub.Marland Kitchen EYES: Pupils are equal, round, and reactive to light when forced open for examination. EARS, NOSE, MOUTH AND THROAT: The oropharynx is clear. The oral mucosa is pink and moist. Hearing difficult to examine given lack of response. LYMPH NODES:  Lymph nodes in the neck are unable to be examined due to tracheostomy. RESPIRATORY:  Lungs are with clear but distant lung sounds. There is normal respiratory effort, with equal breath sounds bilaterally, and without pathologic use of accessory muscles. CARDIOVASCULAR: Heart is regular without murmurs, gallops, or rubs. GI: The abdomen is soft, nontender, and nondistended. There are no palpable masses. There is no hepatosplenomegaly. There are normal bowel sounds in all quadrants. GU: Rectal deferred.   MUSCULOSKELETAL: Unable to assess strength is patient does not currently follow commands.   SKIN: Multiple areas of skin breakdown. Sacral stage IV decubitus ulcer with a right lateral area of devitalized tissue. No visualized draining pus on examination. No spreading  erythema on examination. The sacral ulcer is approximately 10 cm in diameter. Able to visualize healthy granulation tissue on top of muscle at the base of the wound. There is also an eschar present covering a stage IV ulcer on the right lateral aspect of her right foot that is approximately 2 cm in diameter NEUROLOGIC: Unable to assess as patient does not respond to verbal commands PSYCH:  Patient with decreased responsiveness. He grimaces and shakes her head to sternal rub.  Data Reviewed I reviewed her labs which show a leukocytosis of 15.7 no current images to review Upon review of her very long medical record I discover that her most recent discharge from [facility] 2 months prior to this was for a very calm  state hospital stay to include infections with C. difficile, and carboplatinum resistant Escherichia coli requiring prolonged isolation to include isolation during dialysis. She had multiple surgical interventions including her tracheostomy and debridements of her sacral ulcer. This was thought to be secondary to her duodenal fistula that was treated by the surgical weight loss team. I have personally reviewed the patient's imaging, laboratory findings and medical records.    Assessment    49 year old female with multiple areas of skin breakdown secondary to pressure ulcers. Admitted for hypotension and decreased responsiveness    Plan    Multiple areas of pressure ulcers. Given her current status and unknown cause of her medical decompensation would not recommend surgical intervention at this time. Although there is devitalized tissue in both visualize pressure ulcers this can be treated locally for now with Dakin solution. Recommend at least twice a day dressing changes with Dakin solution and a wet-to-dry format. Should these develop obvious signs of spreading infection would then consider sharp debridement possibly at bedside. We will continue to follow with you.     Time spent with the  patient was 45 minutes, with more than 50% of the time spent in face-to-face education, counseling and care coordination.     Ricarda Frame 08/03/2015, 4:27 PM

## 2015-08-12 NOTE — Consult Note (Signed)
PULMONARY / CRITICAL CARE MEDICINE   Name: Courtney [name] MRN: [id number] DOB: [birth year]    ADMISSION DATE:  06-Sep-2015 CONSULTATION DATE: 9/23  INITIAL PRESENTATION:  56 F who has been in medical facilities (hosp, LTAC, rehab) for 2 yrs following gastric bypass surgery with multiple complications. Now with chronic trach, ESRD, profound debilitation, severe sacral pressure ulcer. Was seemingly making progress and transferred to rehab facility approx one week prior to this admission. She was sent to [facility] ED with AMS and hypotension. Working dx of severe sepsis/septic shock due to infected sacral pressure ulcer. Also has R side externalized HD cath and tunneled L IJ CVL. She has history of resistant bacterial infections. She is on chronic steroids. Her husband indicates that she has been diagnosed with adrenal insuff at some point during the past 2 yrs  MAJOR EVENTS/TEST RESULTS: 9/23 CT head: NAD 9/23 EEG:   INDWELLING DEVICES:: Trach (chronic)  Tunneled R IJ HD cath (chronic) Tunneled L IJ CVL (chronic) L femoral A-line 9/23 >>   MICRO DATA: MRSA PCR 9/23 >> NEG Urine 9/23 >>  Wound 9/23 >> Blood 9/23 >>    ANTIMICROBIALS:  Vanc 9/23 >>  Aztreonam 9/23 >>  Meropenem 9/23 >>    HISTORY OF PRESENT ILLNESS:  As above  PAST MEDICAL HISTORY :   has a past medical history of Obesity; Dyslipidemia; Hypertension; Coronary artery disease; Dysrhythmia; Diabetes mellitus; and ESRD (end stage renal disease) on dialysis.  has past surgical history that includes Cholecystectomy (1990); PCI LAD (12/2010); Colonoscopy (08/2011); left heart catheterization with coronary angiogram (N/A, 01/28/2012); Tracheostomy; and Gastric bypass. Prior to Admission medications   Medication Sig Start Date End Date Taking? Authorizing Provider  b complex-vitamin c-folic acid (NEPHRO-VITE) 0.8 MG TABS tablet Take 1 tablet by mouth every evening.   Yes Historical Provider, MD  carvedilol (COREG) 25 MG  tablet Take 25 mg by mouth every evening.   Yes Historical Provider, MD  collagenase (SANTYL) ointment Apply 1 application topically daily.   Yes Historical Provider, MD  Darbepoetin Alfa (ARANESP) 100 MCG/0.5ML SOSY injection Inject 100 mcg into the skin every 7 (seven) days. On Saturday at dialysis   Yes Historical Provider, MD  escitalopram (LEXAPRO) 5 MG tablet Take 5 mg by mouth at bedtime.   Yes Historical Provider, MD  heparin 5000 UNIT/ML injection Inject 5,000 Units into the skin every 8 (eight) hours.   Yes Historical Provider, MD  insulin lispro (HUMALOG) 100 UNIT/ML injection Inject into the skin 4 (four) times daily -  before meals and at bedtime. Per sliding scale   Yes Historical Provider, MD  insulin lispro (HUMALOG) 100 UNIT/ML injection Inject into the skin 3 (three) times daily. Per sliding scale   Yes Historical Provider, MD  lidocaine (LIDODERM) 5 % Place 1 patch onto the skin daily. Remove & Discard patch within 12 hours or as directed by MD   Yes Historical Provider, MD  lisinopril (PRINIVIL,ZESTRIL) 40 MG tablet Take 40 mg by mouth every evening.   Yes Historical Provider, MD  megestrol (MEGACE) 400 MG/10ML suspension Take 800 mg by mouth 2 (two) times daily.   Yes Historical Provider, MD  mirtazapine (REMERON) 15 MG tablet Take 15 mg by mouth at bedtime.   Yes Historical Provider, MD  oxyCODONE (OXY IR/ROXICODONE) 5 MG immediate release tablet Take 5 mg by mouth every 8 (eight) hours.   Yes Historical Provider, MD  oxyCODONE (OXY IR/ROXICODONE) 5 MG immediate release tablet Take 5 mg by mouth every  8 (eight) hours as needed for moderate pain.   Yes Historical Provider, MD  potassium chloride SA (K-DUR,KLOR-CON) 20 MEQ tablet Take 20 mEq by mouth every evening.   Yes Historical Provider, MD  predniSONE (DELTASONE) 10 MG tablet Take 10 mg by mouth 2 (two) times daily.   Yes Historical Provider, MD  psyllium (METAMUCIL) 58.6 % powder Take 1 packet by mouth 2 (two) times daily.    Yes Historical Provider, MD  thiamine (VITAMIN B-1) 100 MG tablet Take 100 mg by mouth every evening.   Yes Historical Provider, MD  vancomycin (VANCOCIN) 50 mg/mL oral solution Take 125 mg by mouth every evening. 08/03/15 08/25/15 Yes Historical Provider, MD   Allergies  Allergen Reactions  . Contrast Media [Iodinated Diagnostic Agents] Anaphylaxis  . Ampicillin Rash    FAMILY HISTORY:  indicated that her mother is deceased. She indicated that her father is alive. She indicated that all of her seven sisters are alive.  SOCIAL HISTORY:  reports that she has never smoked. She has never used smokeless tobacco. She reports that she does not drink alcohol or use illicit drugs.  REVIEW OF SYSTEMS:  Not able to obtain due to AMS  SUBJECTIVE:   VITAL SIGNS: Temp:  [97.5 F (36.4 C)-98.2 F (36.8 C)] 98 F (36.7 C) (09/23 5284) Pulse Rate:  [73-90] 77 (09/23 2100) Resp:  [0-38] 21 (09/23 2100) BP: (55-138)/(27-79) 132/42 mmHg (09/23 2058) SpO2:  [98 %-100 %] 100 % (09/23 2100) Arterial Line BP: (126-163)/(42-54) 139/51 mmHg (09/23 2100) FiO2 (%):  [30 %-35 %] 35 % (09/23 2037) Weight:  [74.571 kg (164 lb 6.4 oz)-86.183 kg (190 lb)] 74.571 kg (164 lb 6.4 oz) (09/23 1300) HEMODYNAMICS:   VENTILATOR SETTINGS: Vent Mode:  [-]  FiO2 (%):  [30 %-35 %] 35 % INTAKE / OUTPUT:  Intake/Output Summary (Last 24 hours) at 08/11/2015 2243 Last data filed at 08/13/2015 1900  Gross per 24 hour  Intake 993.95 ml  Output      0 ml  Net 993.95 ml    PHYSICAL EXAMINATION: General: RASS -3, not F/C, no respiratory distress Neuro: CNs intact, MAEs, DTRs symmetric HEENT: Cushingoid, NCAT Neck: no JVD noted, trach site clean Cardiovascular: Regular, no M noted Lungs: No adventitious sounds Abdomen: Obese, soft, NT, diminished BS Ext: cool, diminished distal pulses, no edema   LABS:  CBC  Recent Labs Lab 08/09/2015 0850  WBC 15.7*  HGB 6.4*  HCT 20.5*  PLT 52*   Coag's No results for  input(s): APTT, INR in the last 168 hours. BMET  Recent Labs Lab 07/23/2015 0850  NA 141  K 2.3*  CL 103  CO2 30  BUN 17  CREATININE 1.29*  GLUCOSE 87   Electrolytes  Recent Labs Lab 08/13/2015 0850  CALCIUM 7.1*  MG 1.4*   Sepsis Markers  Recent Labs Lab 08/09/2015 0853 08/08/2015 1605  LATICACIDVEN 1.2 1.5   ABG  Recent Labs Lab 08/07/2015 1200 07/24/2015 1721  PHART 7.50* 7.48*  PCO2ART 42 42  PO2ART 157* 121*   Liver Enzymes  Recent Labs Lab 08/15/2015 0850  AST 16  ALT 13*  ALKPHOS 175*  BILITOT 0.9  ALBUMIN 1.6*   Cardiac Enzymes  Recent Labs Lab 08/19/2015 0850  TROPONINI 0.22*   Glucose  Recent Labs Lab 08/09/2015 0814 07/21/2015 1338 07/30/2015 1718 08/10/2015 1950  GLUCAP 72 97 96 117*    CXR: NACPD    ASSESSMENT / PLAN:  PULMONARY A: 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 P:   Supplemental O2 to maintain SpO2 90-96% If requires vent support, will need to change trach to cuffed  CARDIOVASCULAR A:  Septic shock Chronic steroids - suspect component of adrenal insuff P:  Has received volume Norepi titrated to maintain MAP > 60 mmHg Empiric stress dose steroids   RENAL A:   ESRD - HD days TTS Hypokalemia P:   Monitor BMET intermittently Monitor I/Os Correct electrolytes as indicated Will need to notify Renal Service to continue HD  GASTROINTESTINAL A:  No issues P:   SUP: N/I unless requires vent support Consider nutrition 9/24  HEMATOLOGIC A:   Acute on chronic anemia - unclear if acute blood loss Thrombocytopenia P:  DVT px: SCDs Monitor CBC intermittently Transfuse per usual ICU guidelines  INFECTIOUS A:   Severe sepsis Infected sacral decubitus ulcer P:   Monitor temp, WBC count Micro and abx as above  ENDOCRINE A:  DM 2 Chronic prednisone therapy Secondary adrenal insuff P:   CBGs and SSI - mod scale Stress dose steroids  NEUROLOGIC A:   Acute encephalopathy -  likely septic in origin P:   RASS goal: 0 Minimize sedating meds   FAMILY: Husband updated in detail  CCM time: 45 mins The above time includes time spent in consultation with patient and/or family members and reviewing care plan on multidisciplinary rounds  Billy Fischer, MD PCCM service Mobile [phone] Pager [phone]    08/06/2015, 10:43 PM

## 2015-08-12 NOTE — Consult Note (Signed)
WOC wound consult note Reason for Consult: Full thickness pressure injury to sacrum, right hip and right foot.  Present on admission.  Surgical consult is ordered and will defer to surgery services.  Spoke with bedside nurse who states that Dakin's solution has been ordered daily.  This will facilitate debridement and provide some odor control. WIll order a mattress replacement with low air loss feature.  Wound type:Stage IV pressure injuries.  Pressure Ulcer POA: Yes  Measurement:Will obtain Monday when back on [facility]. Wound bed:100% devitalized tissue Drainage (amount, consistency, odor) Moderate purulent drainage.  Foul odor.  Periwound:Intact Dressing procedure/placement/frequency:LALM replacement will be ordered.  Dakin's solution to be ordered by surgery.  Will assess Monday.  Will not follow at this time.  Please re-consult if needed.  Maple Hudson RN BSN CWON Pager [phone]

## 2015-08-12 NOTE — OR Nursing (Signed)
Dr Cherylann Ratel had been paged, covering for Dr. Thedore Mins.  Informed of unresponsiveness, v/s, FSBS, patient has been transported to ED, Room 8.

## 2015-08-12 NOTE — Consult Note (Signed)
ANTIBIOTIC CONSULT NOTE - INITIAL  Pharmacy Consult for aztreonam and meropenem Indication: sepsis  Allergies  Allergen Reactions  . Contrast Media [Iodinated Diagnostic Agents] Anaphylaxis  . Ampicillin Rash    Patient Measurements: Height:  (175.3 cm) Weight: 164 lb 6.4 oz (74.571 kg) IBW/kg (Calculated) : 66.2 Adjusted Body Weight:   Vital Signs: Temp: 97.5 F (36.4 C) (09/23 1700) Temp Source: Axillary (09/23 1700) BP: 118/44 mmHg (09/23 1700) Pulse Rate: 84 (09/23 1700) Intake/Output from previous day:   Intake/Output from this shift: Total I/O In: 628.3 [I.V.:128.3; IV Piggyback:500] Out: -   Labs:  Recent Labs  08/29/15 0850  WBC 15.7*  HGB 6.4*  PLT 52*  CREATININE 1.29*   Estimated Creatinine Clearance: 54.5 mL/min (by C-G formula based on Cr of 1.29). No results for input(s): VANCOTROUGH, VANCOPEAK, VANCORANDOM, GENTTROUGH, GENTPEAK, GENTRANDOM, TOBRATROUGH, TOBRAPEAK, TOBRARND, AMIKACINPEAK, AMIKACINTROU, AMIKACIN in the last 72 hours.   Microbiology: Recent Results (from the past 720 hour(s))  Culture, blood (routine x 2)     Status: None   Collection Time: 08/01/15  7:00 PM  Result Value Ref Range Status   Specimen Description BLOOD RIGHT ANTECUBITAL  Final   Special Requests BOTTLES DRAWN AEROBIC ONLY 5CC  Final   Culture NO GROWTH 5 DAYS  Final   Report Status 08/06/2015 FINAL  Final  Culture, blood (routine x 2)     Status: None   Collection Time: 08/01/15  7:10 PM  Result Value Ref Range Status   Specimen Description BLOOD RIGHT HAND  Final   Special Requests IN PEDIATRIC BOTTLE 3CC  Final   Culture  Setup Time   Final    GRAM POSITIVE COCCI IN CLUSTERS AEROBIC BOTTLE ONLY CRITICAL RESULT CALLED TO, READ BACK BY AND VERIFIED WITH: D TAYLOR,RN AT 1115 08/02/15 BY L BENFIELD    Culture STAPHYLOCOCCUS SPECIES (COAGULASE NEGATIVE)  Final   Report Status 08/06/2015 FINAL  Final   Organism ID, Bacteria STAPHYLOCOCCUS SPECIES (COAGULASE  NEGATIVE)  Final      Susceptibility   Staphylococcus species (coagulase negative) - MIC*    CIPROFLOXACIN >=8 RESISTANT Resistant     ERYTHROMYCIN <=0.25 SENSITIVE Sensitive     GENTAMICIN 8 INTERMEDIATE Intermediate     OXACILLIN >=4 RESISTANT Resistant     TETRACYCLINE <=1 SENSITIVE Sensitive     VANCOMYCIN 1 SENSITIVE Sensitive     TRIMETH/SULFA 160 RESISTANT Resistant     CLINDAMYCIN <=0.25 SENSITIVE Sensitive     RIFAMPIN <=0.5 SENSITIVE Sensitive     Inducible Clindamycin NEGATIVE Sensitive     * STAPHYLOCOCCUS SPECIES (COAGULASE NEGATIVE)  Culture, blood (routine x 2)     Status: None   Collection Time: 08/02/15  7:00 PM  Result Value Ref Range Status   Specimen Description BLOOD RIGHT ANTECUBITAL  Final   Special Requests BOTTLES DRAWN AEROBIC ONLY 10CC  Final   Culture  Setup Time   Final    GRAM POSITIVE COCCI IN CLUSTERS AEROBIC BOTTLE ONLY CRITICAL RESULT CALLED TO, READ BACK BY AND VERIFIED WITH: DR Gwenevere Abbot RN 1549 08/03/15 A BROWNING    Culture   Final    STAPHYLOCOCCUS SPECIES (COAGULASE NEGATIVE) SUSCEPTIBILITIES PERFORMED ON PREVIOUS CULTURE WITHIN THE LAST 5 DAYS.    Report Status 08/06/2015 FINAL  Final  Culture, blood (routine x 2)     Status: None   Collection Time: 08/02/15  7:05 PM  Result Value Ref Range Status   Specimen Description BLOOD RIGHT HAND  Final   Special Requests  BOTTLES DRAWN AEROBIC AND ANAEROBIC 10CC  Final   Culture NO GROWTH 5 DAYS  Final   Report Status 08/07/2015 FINAL  Final  MRSA PCR Screening     Status: None   Collection Time: 08/18/2015  2:38 PM  Result Value Ref Range Status   MRSA by PCR NEGATIVE NEGATIVE Final    Comment:        The GeneXpert MRSA Assay (FDA approved for NASAL specimens only), is one component of a comprehensive MRSA colonization surveillance program. It is not intended to diagnose MRSA infection nor to guide or monitor treatment for MRSA infections.     Medical History: Past Medical History   Diagnosis Date  . Obesity   . Dyslipidemia   . Hypertension   . Coronary artery disease     s/p BMS 2010 LAD  . Dysrhythmia     ventricular tachycardia resolved after LAD stent and beta blocker  . Diabetes mellitus     with retinopathy, neuropathy and microalbuminemia  . ESRD (end stage renal disease) on dialysis     Medications:  Scheduled:  . sodium chloride   Intravenous Once  . [START ON 08/13/2015] aztreonam  2 g Intravenous 3 times per day  . hydrocortisone sod succinate (SOLU-CORTEF) inj  50 mg Intravenous Q6H  . insulin aspart  0-15 Units Subcutaneous 6 times per day  . magnesium sulfate 1 - 4 g bolus IVPB  2 g Intravenous Once  . meropenem (MERREM) IV  1 g Intravenous 3 times per day  . potassium chloride  10 mEq Intravenous Q1 Hr x 6  . sodium chloride  3 mL Intravenous Q12H  . vancomycin  750 mg Intravenous Q12H   Assessment: Pt is a 49 year old female on HD TTSat and sepsis. Pharmacy consulted to dose meropenem and aztreonam  Goal of Therapy:  resolution of infection  Plan:  due to HD will give meropenem  q 24 hours (to be given after HD on dialysis days) and aztreonam 1g q 24 hours (given after dialysis on HD days). pt recieved one dose of aztreonam in ED   Olene Floss 08/01/2015,6:20 PM

## 2015-08-12 NOTE — Progress Notes (Signed)
ELECTROLYTE MANAGEMENT - INITIAL  Pharmacy Consult for Electrolyte Management Indication: hypokalemia  Allergies  Allergen Reactions  . Contrast Media [Iodinated Diagnostic Agents] Anaphylaxis  . Ampicillin Rash    Patient Measurements: Height:  (172.7 cm) Weight: 190 lb (86.183 kg) IBW/kg (Calculated) : 63.9   Vital Signs: Temp: 97.7 F (36.5 C) (09/23 0847) Temp Source: Oral (09/23 0847) BP: 70/57 mmHg (09/23 0930) Pulse Rate: 83 (09/23 0930) Intake/Output from previous day:   Intake/Output from this shift:    Labs:  Recent Labs  08/20/2015 0850  WBC 15.7*  HGB 6.4*  HCT 20.5*  PLT 52*     Recent Labs  08/20/15 0850  NA 141  K 2.3*  CL 103  CO2 30  GLUCOSE 87  BUN 17  CREATININE 1.29*  CALCIUM 7.1*  PROT 4.4*  ALBUMIN 1.6*  AST 16  ALT 13*  ALKPHOS 175*  BILITOT 0.9   Estimated Creatinine Clearance: 60 mL/min (by C-G formula based on Cr of 1.29).    Recent Labs  Aug 20, 2015 0814  GLUCAP 72    Medical History: Past Medical History  Diagnosis Date  . Obesity   . Dyslipidemia   . Hypertension   . Coronary artery disease     s/p BMS 2010 LAD  . Dysrhythmia     ventricular tachycardia resolved after LAD stent and beta blocker  . Diabetes mellitus     with retinopathy, neuropathy and microalbuminemia    Medications:  Scheduled:  . aztreonam  2 g Intramuscular 3 times per day  . potassium chloride  40 mEq Oral Once  . vancomycin  750 mg Intravenous Q12H    Assessment: 49 yo female admitted for sepsis, found to have hypokalemia.  Pharmacy consulted for electrolyte management.  9/23 AM K+ 2.3, Mg 1.4  Plan:  Will give KCl 10 meq IV x6 doses and magnesium sulfate 2g IV x1.  Recheck K+ and Mg .  Jacqualyn Posey, PharmD Clinical Pharmacist August 20, 2015,10:13 AM

## 2015-08-12 NOTE — Procedures (Signed)
Arterial Catheter Insertion Procedure Note Shawan Tosh [id number] 01/12/1966  Procedure: Insertion of Arterial Catheter  Indications: Blood pressure monitoring  Procedure Details Consent: Risks of procedure as well as the alternatives and risks of each were explained to the (patient/caregiver).  Consent for procedure obtained. Time Out: Verified patient identification, verified procedure, site/side was marked, verified correct patient position, special equipment/implants available, medications/allergies/relevent history reviewed, required imaging and test results available.  Performed  Maximum sterile technique was used including antiseptics, cap, gloves, gown, hand hygiene, mask and sheet. Skin prep: Chlorhexidine; local anesthetic administered 20 gauge catheter was inserted into left femoral artery using the Seldinger technique.  Evaluation Blood flow good; BP tracing good. Complications: No apparent complications.   Billy Fischer 09/07/2015

## 2015-08-12 NOTE — Progress Notes (Signed)
Pt arrived via geri chair with CNA from [facility], non responsive, CNA advises "they had issue with her blood pressure last night".  IVF 20G started LAC by Carma Lair, RN, IVF NS up [phone].

## 2015-08-12 NOTE — ED Notes (Signed)
Pt to ED from same day surgery, nurse from same day surgery advised that pt came to them today for a blood transfusion, states when pt arrived to same day surgery she was unresponsive with initial BP of 54/17, pt has trach in place, initial o2 sat upon arrival to ED in the low 90's, pt responds with slight movement to painful stimuli, has left gaze with no reaction to pupils, skin warm and dry, NSR on monitor with multiple PVC's

## 2015-08-12 NOTE — Progress Notes (Signed)
Rapid response team in

## 2015-08-12 NOTE — Progress Notes (Signed)
Transported via stretcher to ED room 8, report given to Jeremy Johann, RN

## 2015-08-12 NOTE — Progress Notes (Signed)
BP 54/17, HR 82   98% sat on RA,   resp  24 and regular

## 2015-08-12 NOTE — Progress Notes (Signed)
Dc'd urine culture per dr simonds.

## 2015-08-12 NOTE — H&P (Signed)
[facility] [place] NAME: Courtney [name]    MR#:  [id number]  DATE OF BIRTH:  1966/05/18  DATE OF ADMISSION:  08/06/2015  PRIMARY CARE PHYSICIAN: Lynne Logan, MD   REQUESTING/REFERRING PHYSICIAN: Thomasene Lot  CHIEF COMPLAINT:   Chief Complaint  Patient presents with  . unresponsive     HISTORY OF PRESENT ILLNESS:  Courtney [name]  is a 49 y.o. female with multiple medical issues. Since dialysis yesterday she has been less responsive and blood pressure is been low. Here in the emergency room with sternal rub she grimaced. Family at the bedside and able to give history. She has been in facilities for over a year and the last facility she was in [facility]. She has a foul-smelling stage IV sacral decubiti. In the ER she was found to be hypotensive, low potassium, low hemoglobin and unresponsive. Hospitalist services were contacted for further evaluation. Case discussed with nephrology Dr. Holley Raring also at the bedside.  PAST MEDICAL HISTORY:   Past Medical History  Diagnosis Date  . Obesity   . Dyslipidemia   . Hypertension   . Coronary artery disease     s/p BMS 2010 LAD  . Dysrhythmia     ventricular tachycardia resolved after LAD stent and beta blocker  . Diabetes mellitus     with retinopathy, neuropathy and microalbuminemia  . ESRD (end stage renal disease) on dialysis     PAST SURGICAL HISTORY:   Past Surgical History  Procedure Laterality Date  . Cholecystectomy  1990  . Pci lad  12/2010  . Colonoscopy  08/2011  . Left heart catheterization with coronary angiogram N/A 01/28/2012    Procedure: LEFT HEART CATHETERIZATION WITH CORONARY ANGIOGRAM;  Surgeon: Sinclair Grooms, MD;  Location: [facility] CATH LAB;  Service: Cardiovascular;  Laterality: N/A;  . Tracheostomy    . Gastric bypass      SOCIAL HISTORY:   Social History  Substance Use Topics  . Smoking status: Never Smoker   . Smokeless  tobacco: Never Used  . Alcohol Use: No     Comment: occassional    FAMILY HISTORY:   Family History  Problem Relation Age of Onset  . Hypertension Father   . Diabetes Father   . Cancer Mother   . Diabetes Paternal Grandfather   . Hypertension Paternal Grandfather   . Diabetes Paternal Grandmother   . Hypertension Paternal Grandmother   . Cancer Maternal Grandmother     colon ca    DRUG ALLERGIES:   Allergies  Allergen Reactions  . Contrast Media [Iodinated Diagnostic Agents] Anaphylaxis  . Ampicillin Rash    REVIEW OF SYSTEMS:  Unable to obtain secondary to altered mental status.  MEDICATIONS AT HOME:   Prior to Admission medications    Sig Start Date End Date Taking? Authorizing Provider                                                                   Medication prescription writer not updated. Patient receives aranesp, Lexapro heparin subcutaneous, sliding scale insulin, Lidoderm patch, lisinopril, Megace, Remeron, Nephro-Vite, prednisone, pro-stat, Roxicodone, vancomycin, vitamin B1, Metamucil, Coreg, potassium chloride.  VITAL SIGNS:  Blood pressure 123/79, pulse 84, temperature 97.7 F (  36.5 C), temperature source Oral, resp. rate 38, height $RemoveBe'5\' 8"'xhlUwlypV$  (1.727 m), weight 86.183 kg (190 lb), SpO2 100 %.  Blood pressure when I saw her systolic 68 over palp  PHYSICAL EXAMINATION:  GENERAL:  49 y.o.-year-old patient lying in the bed with no acute distress.  EYES: Pupils equal, round, dilated. No scleral icterus. HEENT: Head atraumatic, normocephalic. Oropharynx and nasopharynx clear.  NECK:  Supple, no jugular venous distention. No thyroid enlargement, no tenderness.  LUNGS: Normal breath sounds bilaterally, no wheezing, rales,rhonchi or crepitation. No use of accessory muscles of respiration.  CARDIOVASCULAR: S1, S2 normal. No murmurs, rubs, or gallops.  ABDOMEN: Soft, nontender, nondistended. Bowel sounds present. No organomegaly or mass.  EXTREMITIES: No  pedal edema, cyanosis, or clubbing.  NEUROLOGIC: Patient grimaces with sternal rub PSYCHIATRIC: Patient grimaces with sternal rub SKIN: Stage IV foul-smelling sacral decubiti very large and 2 inches deep  LABORATORY PANEL:   CBC  Recent Labs Lab 07/21/2015 0850  WBC 15.7*  HGB 6.4*  HCT 20.5*  PLT 52*   ------------------------------------------------------------------------------------------------------------------  Chemistries   Recent Labs Lab 07/23/2015 0850  NA 141  K 2.3*  CL 103  CO2 30  GLUCOSE 87  BUN 17  CREATININE 1.29*  CALCIUM 7.1*  MG 1.4*  AST 16  ALT 13*  ALKPHOS 175*  BILITOT 0.9   ------------------------------------------------------------------------------------------------------------------  Cardiac Enzymes  Recent Labs Lab 07/27/2015 0850  TROPONINI 0.22*   ------------------------------------------------------------------------------------------------------------------  RADIOLOGY:  Dg Chest 1 View  08/18/2015   CLINICAL DATA:  Hypotension. Unresponsive patient with tracheostomy in place.  EXAM: CHEST 1 VIEW  COMPARISON:  08/02/2015  FINDINGS: Cardiac silhouette is top-normal in size. No mediastinal or hilar masses or evidence of adenopathy. Lungs are clear. No pleural effusion or pneumothorax.  Right internal jugular dual-lumen central venous catheter is stable with its distal tip in the right atrium. Left internal jugular small caliber dual-lumen central venous catheter is stable with its tip in the mid superior vena cava.  Tracheostomy tube is well positioned and also stable.  IMPRESSION: 1. Tracheostomy tube and central venous lines are stable as detailed above. 2. No acute cardiopulmonary disease.   Electronically Signed   By: Lajean Manes M.D.   On: 07/24/2015 09:24   Ct Head Wo Contrast  07/30/2015   CLINICAL DATA:  Unresponsive patient.  Leftward gaze.  EXAM: CT HEAD WITHOUT CONTRAST  TECHNIQUE: Contiguous axial images were obtained from  the base of the skull through the vertex without intravenous contrast.  COMPARISON:  MRI 01/28/2012.  FINDINGS: No mass lesion, mass effect, midline shift, hydrocephalus, hemorrhage. No territorial ischemia or acute infarction. Unchanged small dilated perivascular spaces are noted in the basal ganglia.  IMPRESSION: Negative CT head.   Electronically Signed   By: Dereck Ligas M.D.   On: 08/13/2015 10:11    EKG:   Sinus rhythm 90 bpm PVCs prolonged QTC  IMPRESSION AND PLAN:   1. Septic shock. Patient has numerous potential sources. Patient has a stage IV foul-smelling sacral decubiti, dialysis catheter and also central line. Looking back at old cultures I didn't see Pseudomonas. Patient also has a history of C. difficile. I will start aggressive antibiotics with vancomycin and cefepime and Levaquin. Follow-up urinalysis and urine culture. Start levophed. Critical care specialist consultation. Hold all antihypertensives medication. 2. Acute encephalopathy- as per ER physician the patient did have a left gaze. . Initial CT scan was negative. ER physician gave a gram of Keppra. Patient has no history of seizure. I will  get a neurology evaluation. May end up needing a MRI of the brain at some point or repeat CT scan. I ordered an EEG. Check an ABG to make sure she is not retaining CO2. 3. symptomatic anemia- I will transfuse 1 unit of blood. 4. Hypokalemia- replace potassium IV. Check magnesium 5. End-stage renal disease on hemodialysis- seen in consultation by nephrology 6. Oxygenate with trach collar at this point 7. Elevated troponin likely demand ischemia with septic shock 8. Stage IV sacral decubitus- will get surgical consultation. Check an ESR because osteomyelitis could be in the differential. I told family member that this will not heal without plastic surgery 9. Thrombocytopenia- Will hold off on heparin subcutaneous at this point and just go with the SCDs and teds. This could be secondary to  sepsis versus Hit  All the records are reviewed and case discussed with ED provider. Management plans discussed with the patient, family and they are in agreement.  CODE STATUS: Full code  TOTAL TIME TAKING CARE OF THIS PATIENT: 60 minutes. Critical care time.  Loletha Grayer M.D on 08/07/2015 at 11:01 AM  Between 7am to 6pm - Pager - [phone]  After 6pm call admission pager [facility]  Office  [phone]  CC: Primary care physician; Lynne Logan, MD

## 2015-08-13 ENCOUNTER — Inpatient Hospital Stay: Payer: 59

## 2015-08-13 DIAGNOSIS — R400 Somnolence: Secondary | ICD-10-CM

## 2015-08-13 DIAGNOSIS — L8990 Pressure ulcer of unspecified site, unspecified stage: Secondary | ICD-10-CM

## 2015-08-13 LAB — CBC
HEMATOCRIT: 27.4 % — AB (ref 35.0–47.0)
Hemoglobin: 8.7 g/dL — ABNORMAL LOW (ref 12.0–16.0)
MCH: 29.5 pg (ref 26.0–34.0)
MCHC: 31.6 g/dL — ABNORMAL LOW (ref 32.0–36.0)
MCV: 93.3 fL (ref 80.0–100.0)
PLATELETS: 46 10*3/uL — AB (ref 150–440)
RBC: 2.94 MIL/uL — ABNORMAL LOW (ref 3.80–5.20)
RDW: 17 % — AB (ref 11.5–14.5)
WBC: 30.3 10*3/uL — AB (ref 3.6–11.0)

## 2015-08-13 LAB — GLUCOSE, CAPILLARY
GLUCOSE-CAPILLARY: 112 mg/dL — AB (ref 65–99)
GLUCOSE-CAPILLARY: 141 mg/dL — AB (ref 65–99)
GLUCOSE-CAPILLARY: 97 mg/dL (ref 65–99)
Glucose-Capillary: 99 mg/dL (ref 65–99)
Glucose-Capillary: 102 mg/dL — ABNORMAL HIGH (ref 65–99)
Glucose-Capillary: 122 mg/dL — ABNORMAL HIGH (ref 65–99)

## 2015-08-13 LAB — BASIC METABOLIC PANEL
ANION GAP: 7 (ref 5–15)
BUN: 19 mg/dL (ref 6–20)
CALCIUM: 7.5 mg/dL — AB (ref 8.9–10.3)
CO2: 28 mmol/L (ref 22–32)
Chloride: 103 mmol/L (ref 101–111)
Creatinine, Ser: 1.41 mg/dL — ABNORMAL HIGH (ref 0.44–1.00)
GFR calc Af Amer: 49 mL/min — ABNORMAL LOW (ref >60)
GFR, EST NON AFRICAN AMERICAN: 43 mL/min — AB (ref >60)
GLUCOSE: 123 mg/dL — AB (ref 65–99)
POTASSIUM: 3.4 mmol/L — AB (ref 3.5–5.1)
Sodium: 138 mmol/L (ref 135–145)

## 2015-08-13 LAB — HEPARIN INDUCED PLATELET AB (HIT ANTIBODY): HEPARIN INDUCED PLT AB: 0.315 {OD_unit} (ref 0.000–0.400)

## 2015-08-13 LAB — TSH: TSH: 0.905 u[IU]/mL (ref 0.350–4.500)

## 2015-08-13 LAB — MAGNESIUM: MAGNESIUM: 1.8 mg/dL (ref 1.7–2.4)

## 2015-08-13 MED ORDER — HYDROCORTISONE NA SUCCINATE PF 100 MG IJ SOLR
50.0000 mg | Freq: Three times a day (TID) | INTRAMUSCULAR | Status: DC
  Administered 2015-08-13 – 2015-08-14 (×3): 50 mg via INTRAVENOUS
  Filled 2015-08-13 (×3): qty 2

## 2015-08-13 MED ORDER — ALBUMIN HUMAN 25 % IV SOLN
25.0000 g | Freq: Once | INTRAVENOUS | Status: AC
  Administered 2015-08-14: 25 g via INTRAVENOUS
  Filled 2015-08-13: qty 100

## 2015-08-13 MED ORDER — POTASSIUM CHLORIDE 10 MEQ/100ML IV SOLN
10.0000 meq | INTRAVENOUS | Status: AC
  Administered 2015-08-13 (×4): 10 meq via INTRAVENOUS
  Filled 2015-08-13 (×4): qty 100

## 2015-08-13 NOTE — Consult Note (Signed)
CC: AMS  HPI: Courtney [name] is an 49 y.o. female  Nursing home resident with ESRD, decubitals presented with decreased s responsiveness and hypotension. Here in the emergency room with sternal rub she grimaced. Family at the bedside and able to give history. Pt has similar episodes in setting of hypotension and CO2 retention. EEG done, no acute epileptiform activity seen.     Mental status has improved. Pt does respond with moving her head up and down  Past Medical History  Diagnosis Date  . Obesity   . Dyslipidemia   . Hypertension   . Coronary artery disease     s/p BMS 2010 LAD  . Dysrhythmia     ventricular tachycardia resolved after LAD stent and beta blocker  . Diabetes mellitus     with retinopathy, neuropathy and microalbuminemia  . ESRD (end stage renal disease) on dialysis     Past Surgical History  Procedure Laterality Date  . Cholecystectomy  1990  . Pci lad  12/2010  . Colonoscopy  08/2011  . Left heart catheterization with coronary angiogram N/A 01/28/2012    Procedure: LEFT HEART CATHETERIZATION WITH CORONARY ANGIOGRAM;  Surgeon: Lesleigh Noe, MD;  Location: [facility] CATH LAB;  Service: Cardiovascular;  Laterality: N/A;  . Tracheostomy    . Gastric bypass      Family History  Problem Relation Age of Onset  . Hypertension Father   . Diabetes Father   . Cancer Mother   . Diabetes Paternal Grandfather   . Hypertension Paternal Grandfather   . Diabetes Paternal Grandmother   . Hypertension Paternal Grandmother   . Cancer Maternal Grandmother     colon ca    Social History:  reports that she has never smoked. She has never used smokeless tobacco. She reports that she does not drink alcohol or use illicit drugs.  Allergies  Allergen Reactions  . Contrast Media [Iodinated Diagnostic Agents] Anaphylaxis  . Ampicillin Rash    Medications: I have reviewed the patient's current medications.  ROS: Unable to obtain  Physical Examination: Blood  pressure 103/29, pulse 84, temperature 100.2 F (37.9 C), temperature source Axillary, resp. rate 23, height  (1.753 m), weight 74.571 kg (164 lb 6.4 oz), SpO2 100 %.  Follows commands this AM with nodding her head up and down EOm intact Moves her extremities symmetrically.    Laboratory Studies:   Basic Metabolic Panel:  Recent Labs Lab 08/18/2015 0850 08/13/15 0250  NA 141 138  K 2.3* 3.4*  CL 103 103  CO2 30 28  GLUCOSE 87 123*  BUN 17 19  CREATININE 1.29* 1.41*  CALCIUM 7.1* 7.5*  MG 1.4* 1.8    Liver Function Tests:  Recent Labs Lab August 18, 2015 0850  AST 16  ALT 13*  ALKPHOS 175*  BILITOT 0.9  PROT 4.4*  ALBUMIN 1.6*   No results for input(s): LIPASE, AMYLASE in the last 168 hours. No results for input(s): AMMONIA in the last 168 hours.  CBC:  Recent Labs Lab 08-18-15 0850 08/13/15 0250  WBC 15.7* 30.3*  NEUTROABS 13.8*  --   HGB 6.4* 8.7*  HCT 20.5* 27.4*  MCV 95.5 93.3  PLT 52* 46*    Cardiac Enzymes:  Recent Labs Lab 08/18/15 0850  TROPONINI 0.22*    BNP: Invalid input(s): POCBNP  CBG:  Recent Labs Lab 08-18-15 1950 08/13/15 0009 08/13/15 0401 08/13/15 0725 08/13/15 1116  GLUCAP 117* 141* 99 112* 102*    Microbiology: Results for orders placed  or performed during the hospital encounter of 07/31/2015  Wound culture     Status: None (Preliminary result)   Collection Time: 07/23/2015  9:20 AM  Result Value Ref Range Status   Specimen Description DECUBITIS  Final   Special Requests Normal  Final   Gram Stain PENDING  Incomplete   Culture HOLDING FOR POSSIBLE PATHOGEN  Final   Report Status PENDING  Incomplete  MRSA PCR Screening     Status: None   Collection Time: 08/18/2015  2:38 PM  Result Value Ref Range Status   MRSA by PCR NEGATIVE NEGATIVE Final    Comment:        The GeneXpert MRSA Assay (FDA approved for NASAL specimens only), is one component of a comprehensive MRSA colonization surveillance program. It is  not intended to diagnose MRSA infection nor to guide or monitor treatment for MRSA infections.     Coagulation Studies: No results for input(s): LABPROT, INR in the last 72 hours.  Urinalysis: No results for input(s): COLORURINE, LABSPEC, PHURINE, GLUCOSEU, HGBUR, BILIRUBINUR, KETONESUR, PROTEINUR, UROBILINOGEN, NITRITE, LEUKOCYTESUR in the last 168 hours.  Invalid input(s): APPERANCEUR  Lipid Panel:  No results found for: CHOL, TRIG, HDL, CHOLHDL, VLDL, LDLCALC  HgbA1C:  Lab Results  Component Value Date   HGBA1C 4.3* 06/16/2015    Urine Drug Screen:  No results found for: LABOPIA, COCAINSCRNUR, LABBENZ, AMPHETMU, THCU, LABBARB  Alcohol Level: No results for input(s): ETH in the last 168 hours.  Other results: EKG: normal EKG, normal sinus rhythm, unchanged from previous tracings.  Imaging: Dg Chest 1 View  08/01/2015   CLINICAL DATA:  Hypotension. Unresponsive patient with tracheostomy in place.  EXAM: CHEST 1 VIEW  COMPARISON:  08/02/2015  FINDINGS: Cardiac silhouette is top-normal in size. No mediastinal or hilar masses or evidence of adenopathy. Lungs are clear. No pleural effusion or pneumothorax.  Right internal jugular dual-lumen central venous catheter is stable with its distal tip in the right atrium. Left internal jugular small caliber dual-lumen central venous catheter is stable with its tip in the mid superior vena cava.  Tracheostomy tube is well positioned and also stable.  IMPRESSION: 1. Tracheostomy tube and central venous lines are stable as detailed above. 2. No acute cardiopulmonary disease.   Electronically Signed   By: Amie Portland M.D.   On: 07/24/2015 09:24   Ct Head Wo Contrast  08/07/2015   CLINICAL DATA:  Unresponsive patient.  Leftward gaze.  EXAM: CT HEAD WITHOUT CONTRAST  TECHNIQUE: Contiguous axial images were obtained from the base of the skull through the vertex without intravenous contrast.  COMPARISON:  MRI 01/28/2012.  FINDINGS: No mass lesion,  mass effect, midline shift, hydrocephalus, hemorrhage. No territorial ischemia or acute infarction. Unchanged small dilated perivascular spaces are noted in the basal ganglia.  IMPRESSION: Negative CT head.   Electronically Signed   By: Andreas Newport M.D.   On: 07/30/2015 10:11   Dg Chest Port 1 View  08/13/2015   CLINICAL DATA:  Respiratory failure  EXAM: PORTABLE CHEST 1 VIEW  COMPARISON:  08/14/2015  FINDINGS: Support devices are unchanged. Cardiomegaly with vascular congestion. Left lower lobe atelectasis or infiltrate has increased. Suspect small left effusion. No confluent opacity on the right.  IMPRESSION: Increasing left lower lobe atelectasis or infiltrate with small left effusion.   Electronically Signed   By: Charlett Nose M.D.   On: 08/13/2015 09:31     Assessment/Plan:  49 y.o. female  Nursing home resident with ESRD, decubitals presented with  decreased s responsiveness and hypotension. Here in the emergency room with sternal rub she grimaced. Family at the bedside and able to give history. Pt has similar episodes in setting of hypotension and CO2 retention. EEG done, no acute epileptiform activity seen.     WBC elevated to 30 today. Source is her decubiti, s/p  Depigment No need for AED at this time Call with questions.      Pauletta Browns    08/13/2015, 11:51 AM

## 2015-08-13 NOTE — Progress Notes (Signed)
CC: Sepsis Subjective: Consuls up by medicine for evaluation of a sacral decubitus ulcer. Since admission patient has had decreasing pressor requirements and has become more alert and cooperative per her husband. Patient remains nonverbal with me on exam.  Objective: Vital signs in last 24 hours: Temp:  [97.5 F (36.4 C)-100.2 F (37.9 C)] 100.2 F (37.9 C) (09/24 0734) Pulse Rate:  [73-90] 84 (09/24 0700) Resp:  [0-31] 22 (09/24 0700) BP: (55-145)/(23-80) 124/34 mmHg (09/24 0700) SpO2:  [100 %] 100 % (09/24 0834) Arterial Line BP: (106-175)/(38-62) 139/47 mmHg (09/24 0700) FiO2 (%):  [28 %-35 %] 28 % (09/24 0834) Weight:  [74.571 kg (164 lb 6.4 oz)] 74.571 kg (164 lb 6.4 oz) (09/23 1300) Last BM Date: 09-Sep-2015  Intake/Output from previous day: 09/23 0701 - 09/24 0700 In: 1728 [I.V.:344; Blood:634; IV Piggyback:750] Out: -  Intake/Output this shift:    Physical exam: Gen.: Resting in bed still on pressor support. Neck: Trach collar in place Skin: Sacral decubitus ulcer examined with nurse again today area of necrotic tissue most cephalad aspect. Exact measurements not taken on exam today.  Lab Results: CBC   Recent Labs  2015/09/09 0850 08/13/15 0250  WBC 15.7* 30.3*  HGB 6.4* 8.7*  HCT 20.5* 27.4*  PLT 52* 46*   BMET  Recent Labs  09-09-15 0850 08/13/15 0250  NA 141 138  K 2.3* 3.4*  CL 103 103  CO2 30 28  GLUCOSE 87 123*  BUN 17 19  CREATININE 1.29* 1.41*  CALCIUM 7.1* 7.5*   PT/INR No results for input(s): LABPROT, INR in the last 72 hours. ABG  Recent Labs  09/09/15 1200 2015/09/09 1721  PHART 7.50* 7.48*  HCO3 32.8* 31.3*    Studies/Results: Dg Chest 1 View  09/09/15   CLINICAL DATA:  Hypotension. Unresponsive patient with tracheostomy in place.  EXAM: CHEST 1 VIEW  COMPARISON:  08/02/2015  FINDINGS: Cardiac silhouette is top-normal in size. No mediastinal or hilar masses or evidence of adenopathy. Lungs are clear. No pleural effusion or  pneumothorax.  Right internal jugular dual-lumen central venous catheter is stable with its distal tip in the right atrium. Left internal jugular small caliber dual-lumen central venous catheter is stable with its tip in the mid superior vena cava.  Tracheostomy tube is well positioned and also stable.  IMPRESSION: 1. Tracheostomy tube and central venous lines are stable as detailed above. 2. No acute cardiopulmonary disease.   Electronically Signed   By: Amie Portland M.D.   On: Sep 09, 2015 09:24   Ct Head Wo Contrast  09/09/2015   CLINICAL DATA:  Unresponsive patient.  Leftward gaze.  EXAM: CT HEAD WITHOUT CONTRAST  TECHNIQUE: Contiguous axial images were obtained from the base of the skull through the vertex without intravenous contrast.  COMPARISON:  MRI 01/28/2012.  FINDINGS: No mass lesion, mass effect, midline shift, hydrocephalus, hemorrhage. No territorial ischemia or acute infarction. Unchanged small dilated perivascular spaces are noted in the basal ganglia.  IMPRESSION: Negative CT head.   Electronically Signed   By: Andreas Newport M.D.   On: 09-09-2015 10:11   Dg Chest Port 1 View  08/13/2015   CLINICAL DATA:  Respiratory failure  EXAM: PORTABLE CHEST 1 VIEW  COMPARISON:  09-09-15  FINDINGS: Support devices are unchanged. Cardiomegaly with vascular congestion. Left lower lobe atelectasis or infiltrate has increased. Suspect small left effusion. No confluent opacity on the right.  IMPRESSION: Increasing left lower lobe atelectasis or infiltrate with small left effusion.   Electronically Signed  By: Charlett Nose M.D.   On: 08/13/2015 09:31    Anti-infectives: Anti-infectives    Start     Dose/Rate Route Frequency Ordered Stop   08/13/15 1800  aztreonam (AZACTAM) 1 g in dextrose 5 % 50 mL IVPB  Status:  Discontinued     1 g 100 mL/hr over 30 Minutes Intravenous Every 24 hours 09-01-15 1840 08/13/15 1021   08/13/15 1200  vancomycin (VANCOCIN) IVPB 750 mg/150 ml premix     750 mg 150  mL/hr over 60 Minutes Intravenous Every T-Th-Sa (Hemodialysis) 09/01/2015 1849     08/13/15 0100  aztreonam (AZACTAM) 2 g in dextrose 5 % 50 mL IVPB  Status:  Discontinued     2 g 100 mL/hr over 30 Minutes Intravenous 3 times per day Sep 01, 2015 1820 01-Sep-2015 1822   01-Sep-2015 2200  cefTAZidime (FORTAZ) 2 g in dextrose 5 % 50 mL IVPB  Status:  Discontinued     2 g 100 mL/hr over 30 Minutes Intravenous 3 times per day 2015/09/01 1051 September 01, 2015 1728   09/01/15 2200  meropenem (MERREM) 1 g in sodium chloride 0.9 % 100 mL IVPB  Status:  Discontinued     1 g 200 mL/hr over 30 Minutes Intravenous 3 times per day 2015/09/01 1812 2015-09-01 1822   Sep 01, 2015 2200  meropenem (MERREM) 500 mg in sodium chloride 0.9 % 50 mL IVPB  Status:  Discontinued     500 mg 100 mL/hr over 30 Minutes Intravenous 3 times per day 09-01-15 1840 September 01, 2015 1903   01-Sep-2015 1915  meropenem (MERREM) 500 mg in sodium chloride 0.9 % 50 mL IVPB     500 mg 100 mL/hr over 30 Minutes Intravenous Every 24 hours 09-01-15 1903     09/01/2015 1900  vancomycin (VANCOCIN) IVPB 750 mg/150 ml premix     750 mg 150 mL/hr over 60 Minutes Intravenous  Once 09-01-2015 1849 01-Sep-2015 2000   Sep 01, 2015 1830  aztreonam (AZACTAM) injection 2 g  Status:  Discontinued     2 g Intramuscular 3 times per day 09/01/2015 1005 09/01/2015 1047   09/01/2015 1530  vancomycin (VANCOCIN) IVPB 750 mg/150 ml premix  Status:  Discontinued     750 mg 150 mL/hr over 60 Minutes Intravenous Every 12 hours September 01, 2015 1005 2015/09/01 1849   September 01, 2015 1400  aztreonam (AZACTAM) 1 g in dextrose 5 % 50 mL IVPB  Status:  Discontinued     1 g 100 mL/hr over 30 Minutes Intravenous 3 times per day 09/01/15 1048 September 01, 2015 1051   09/01/2015 1130  levofloxacin (LEVAQUIN) IVPB 750 mg  Status:  Discontinued     750 mg 100 mL/hr over 90 Minutes Intravenous Every 24 hours 01-Sep-2015 1128 September 01, 2015 1728   01-Sep-2015 1100  Levofloxacin (LEVAQUIN) IVPB 250 mg  Status:  Discontinued     250 mg 50 mL/hr over 60 Minutes  Intravenous Every 24 hours 09-01-2015 1048 Sep 01, 2015 1128   01-Sep-2015 0900  vancomycin (VANCOCIN) IVPB 1000 mg/200 mL premix     1,000 mg 200 mL/hr over 60 Minutes Intravenous  Once 09-01-2015 0849 09-01-2015 1108   09-01-2015 0900  aztreonam (AZACTAM) 2 g in dextrose 5 % 50 mL IVPB     2 g 100 mL/hr over 30 Minutes Intravenous  Once 2015-09-01 0849 09-01-2015 1108      Assessment/Plan:  49 year old female with a large infected sacral decubitus ulcer Area of necrotic tissue and abscess in the most cephalad aspect debrided sharply at the bedside today. Will send debrided  tissue to lab for culture Continue Dakin's dressings wet-to-dry Will continue to follow  Charles T. Tonita Cong, MD, FACS  08/13/2015

## 2015-08-13 NOTE — Progress Notes (Signed)
Pt responsive only painful stimulai. Levophed drip is in progress. VSs. A fib on CM. Lung sound coarse. 1 unit of PRBC given Hb 8.7 now. Pt is on trach colloar at 28%. Resting comfortably in bed without any distress.

## 2015-08-13 NOTE — Progress Notes (Signed)
PULMONARY / CRITICAL CARE MEDICINE   Name: Courtney [name] MRN: [id number] DOB: Aug 05, 1966    ADMISSION DATE:  08/19/2015 CONSULTATION DATE: 9/23  INITIAL PRESENTATION:  8 F who has been in medical facilities (hosp, LTAC, rehab) for 2 yrs following gastric bypass surgery with multiple complications. Now with chronic trach, ESRD, profound debilitation, severe sacral pressure ulcer. Was seemingly making progress and transferred to rehab facility approx one week prior to this admission. She was sent to [facility] ED with AMS and hypotension. Working dx of severe sepsis/septic shock due to infected sacral pressure ulcer. Also has R side externalized HD cath and tunneled L IJ CVL. She has history of resistant bacterial infections. She is on chronic steroids. Her husband indicates that she has been diagnosed with adrenal insuff at some point during the past 2 yrs  MAJOR EVENTS/TEST RESULTS: 9/23 CT head: NAD 9/23 EEG: no epileptiform activity 9/23 PRBCs for Hgb 6.4 9/24 bedside debridement of sacral wound. Abscess drained  INDWELLING DEVICES:: Trach (chronic)  Tunneled R IJ HD cath (chronic) Tunneled L IJ CVL (chronic) L femoral A-line 9/23 >>   MICRO DATA: MRSA PCR 9/23 >> NEG Urine 9/23 >>  Wound 9/23 >> Blood 9/23 >>  Tissue 9/24 >>    ANTIMICROBIALS:  Aztreonam 9/23 >> 9/24 Vanc 9/23 >>  Meropenem 9/23 >>    SUBJECTIVE:  RASS -1. + F/C  VITAL SIGNS: Temp:  [97.5 F (36.4 C)-100.2 F (37.9 C)] 100.2 F (37.9 C) (09/24 0734) Pulse Rate:  [73-90] 84 (09/24 1100) Resp:  [0-27] 23 (09/24 1100) BP: (55-145)/(23-80) 103/29 mmHg (09/24 1100) SpO2:  [100 %] 100 % (09/24 1150) Arterial Line BP: (106-175)/(38-65) 131/64 mmHg (09/24 1100) FiO2 (%):  [28 %-35 %] 28 % (09/24 1150) Weight:  [74.571 kg (164 lb 6.4 oz)] 74.571 kg (164 lb 6.4 oz) (09/23 1300) HEMODYNAMICS:   VENTILATOR SETTINGS: Vent Mode:  [-]  FiO2 (%):  [28 %-35 %] 28 % INTAKE / OUTPUT:  Intake/Output  Summary (Last 24 hours) at 08/13/15 1201 Last data filed at 08/05/2015 2345  Gross per 24 hour  Intake 1627.95 ml  Output      0 ml  Net 1627.95 ml    PHYSICAL EXAMINATION: General: RASS -1, + F/C, no respiratory distress Neuro: CNs intact, MAEs, DTRs symmetric HEENT: Cushingoid facies Neck: no JVD noted, trach site clean Cardiovascular: Regular, no M noted Lungs: No adventitious sounds Abdomen: Obese, soft, NT, diminished BS Ext: cool, diminished distal pulses, no edema Skin: very deep, malodorous sacral pressure ulcer  LABS:  CBC  Recent Labs Lab 08/01/2015 0850 08/13/15 0250  WBC 15.7* 30.3*  HGB 6.4* 8.7*  HCT 20.5* 27.4*  PLT 52* 46*   Coag's No results for input(s): APTT, INR in the last 168 hours. BMET  Recent Labs Lab 08/19/2015 0850 08/13/15 0250  NA 141 138  K 2.3* 3.4*  CL 103 103  CO2 30 28  BUN 17 19  CREATININE 1.29* 1.41*  GLUCOSE 87 123*   Electrolytes  Recent Labs Lab 08/06/2015 0850 08/13/15 0250  CALCIUM 7.1* 7.5*  MG 1.4* 1.8   Sepsis Markers  Recent Labs Lab 08/11/2015 0853 08/19/2015 1605  LATICACIDVEN 1.2 1.5   ABG  Recent Labs Lab 08/17/2015 1200 08/03/2015 1721  PHART 7.50* 7.48*  PCO2ART 42 42  PO2ART 157* 121*   Liver Enzymes  Recent Labs Lab 07/27/2015 0850  AST 16  ALT 13*  ALKPHOS 175*  BILITOT 0.9  ALBUMIN 1.6*   Cardiac Enzymes  Recent Labs Lab 08/25/2015 0850  TROPONINI 0.22*   Glucose  Recent Labs Lab Aug 25, 2015 1718 2015/08/25 1950 08/13/15 0009 08/13/15 0401 08/13/15 0725 08/13/15 1116  GLUCAP 96 117* 141* 99 112* 102*    CXR: NACPD    ASSESSMENT / PLAN:  PULMONARY A: 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 P:   Supplemental O2 to maintain SpO2 90-96% If requires vent support, will need to change trach to cuffed  CARDIOVASCULAR A:  Septic shock, resolving Chronic steroids - suspect component of adrenal insuff P:  Has received volume Wean NE to off  for MAP > 60 mmHg Cont empiric stress dose steroids - dose reduced 9/24  RENAL A:   ESRD - HD days TTS Hypokalemia P:   Monitor BMET intermittently Monitor I/Os Correct electrolytes as indicated Nephrollogy following  GASTROINTESTINAL A:  No issues P:   SUP: N/I unless requires vent support Too somnolent for PO nutrition presently Consider diet 9/25  HEMATOLOGIC A:   Acute on chronic anemia - unclear if acute blood loss Thrombocytopenia - likely due to sepsis P:  DVT px: SCDs Monitor CBC intermittently Transfuse per usual guidelines  INFECTIOUS A:   Severe sepsis Sacral decubitus ulcer with abscess P:   Monitor temp, WBC count Micro and abx as above Surgery following  ENDOCRINE A:  DM 2 Chronic prednisone therapy - chronic dose 10 mg BID Secondary adrenal insuff P:   CBGs and SSI - mod scale Stress dose steroids - wean as able based on BP  NEUROLOGIC A:   Acute encephalopathy, resolving P:   RASS goal: 0 Minimize sedating meds   FAMILY: Husband updated in detail  CCM time: 30 mins The above time includes time spent in consultation with patient and/or family members and reviewing care plan on multidisciplinary rounds  Billy Fischer, MD PCCM service Mobile [phone] Pager [phone]    08/13/2015, 12:01 PM

## 2015-08-13 NOTE — Progress Notes (Signed)
Subjective:  Pt seen at bedside. Some improvement noted.  Pt is arousable and will nod yes/no to questions.  Still on pressors though these are being weaned. Blood cultures pending. Wound culture with possible growth. Dialysis held today as still on pressors and electrolytes acceptable.    Objective:  Vital signs: 98.3 69 18 121/66   Physical Exam: General: Critically ill appearing  HEENT Eyes closed, OM moist  Neck Trach in place, trach collar overlying  Pulm/lungs Scattered rhonchi normal effort  CVS/Heart S1S2 no rubs  Abdomen:  Soft, non tender, BS present  Extremities: Trace b/l LE edema  Neurologic: Arousable, does nod yes/no to questions  Skin: Warm, no acute rashes  Access: Rt IJ PC       Basic Metabolic Panel:  Recent Labs Lab 08/19/2015 0850 08/13/15 0250  NA 141 138  K 2.3* 3.4*  CL 103 103  CO2 30 28  GLUCOSE 87 123*  BUN 17 19  CREATININE 1.29* 1.41*  CALCIUM 7.1* 7.5*  MG 1.4* 1.8     CBC:  Recent Labs Lab 08/10/2015 0850 08/13/15 0250  WBC 15.7* 30.3*  NEUTROABS 13.8*  --   HGB 6.4* 8.7*  HCT 20.5* 27.4*  MCV 95.5 93.3  PLT 52* 46*      Microbiology: Results for orders placed or performed during the hospital encounter of 07/31/2015  Wound culture     Status: None (Preliminary result)   Collection Time: 08/18/2015  9:20 AM  Result Value Ref Range Status   Specimen Description DECUBITIS  Final   Special Requests Normal  Final   Gram Stain PENDING  Incomplete   Culture HOLDING FOR POSSIBLE PATHOGEN  Final   Report Status PENDING  Incomplete  MRSA PCR Screening     Status: None   Collection Time: 07/23/2015  2:38 PM  Result Value Ref Range Status   MRSA by PCR NEGATIVE NEGATIVE Final    Comment:        The GeneXpert MRSA Assay (FDA approved for NASAL specimens only), is one component of a comprehensive MRSA colonization surveillance program. It is not intended to diagnose MRSA infection nor to guide or monitor treatment for MRSA  infections.     Coagulation Studies: No results for input(s): LABPROT, INR in the last 72 hours.  Urinalysis: No results for input(s): COLORURINE, LABSPEC, PHURINE, GLUCOSEU, HGBUR, BILIRUBINUR, KETONESUR, PROTEINUR, UROBILINOGEN, NITRITE, LEUKOCYTESUR in the last 72 hours.  Invalid input(s): APPERANCEUR    Imaging: Dg Chest 1 View  08/01/2015   CLINICAL DATA:  Hypotension. Unresponsive patient with tracheostomy in place.  EXAM: CHEST 1 VIEW  COMPARISON:  08/02/2015  FINDINGS: Cardiac silhouette is top-normal in size. No mediastinal or hilar masses or evidence of adenopathy. Lungs are clear. No pleural effusion or pneumothorax.  Right internal jugular dual-lumen central venous catheter is stable with its distal tip in the right atrium. Left internal jugular small caliber dual-lumen central venous catheter is stable with its tip in the mid superior vena cava.  Tracheostomy tube is well positioned and also stable.  IMPRESSION: 1. Tracheostomy tube and central venous lines are stable as detailed above. 2. No acute cardiopulmonary disease.   Electronically Signed   By: Amie Portland M.D.   On: 08/04/2015 09:24   Ct Head Wo Contrast  08/03/2015   CLINICAL DATA:  Unresponsive patient.  Leftward gaze.  EXAM: CT HEAD WITHOUT CONTRAST  TECHNIQUE: Contiguous axial images were obtained from the base of the skull through the vertex without intravenous contrast.  COMPARISON:  MRI 01/28/2012.  FINDINGS: No mass lesion, mass effect, midline shift, hydrocephalus, hemorrhage. No territorial ischemia or acute infarction. Unchanged small dilated perivascular spaces are noted in the basal ganglia.  IMPRESSION: Negative CT head.   Electronically Signed   By: Andreas Newport M.D.   On: 08/03/2015 10:11   Dg Chest Port 1 View  08/13/2015   CLINICAL DATA:  Respiratory failure  EXAM: PORTABLE CHEST 1 VIEW  COMPARISON:  07/21/2015  FINDINGS: Support devices are unchanged. Cardiomegaly with vascular congestion. Left lower  lobe atelectasis or infiltrate has increased. Suspect small left effusion. No confluent opacity on the right.  IMPRESSION: Increasing left lower lobe atelectasis or infiltrate with small left effusion.   Electronically Signed   By: Charlett Nose M.D.   On: 08/13/2015 09:31    Results for [facility] BREANN, LOSANO (MRN [id number]) as of 07/18/2015 15:59  Ref. Range 07/15/2015 12:12  Hepatitis B Surface Ag Latest Ref Range: Negative  Negative   Medications:    Assessment/ Plan:  49 y.o. 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  1. End-stage renal disease on hemodialysis on HD TTHS. The patient has been on dialysis since October of 2014.  Pt last had HD on Thursday, still on pressors. Electrolytes acceptable at the moment, in fact K still slightly low.  Therefore will hold HD today and allow for a bit more stabilization and consider HD again tomorrow.    2. Anemia of CKD:  S/p blood transfusion, hgb improved to 8.7, continue to monitor.   3. Secondary hyperparathyroidism: will check pth/phos with next HD.   4. Septic shock:  Multiple potential sources, has two catheters in place, also has a sacral decubitis, and also has trach in place.  -awaiting culture data, continue aztreonam and vancomycin at this time.   5.  Hypokalemia: K improved to 3.4, continue KCL repletion protocol for now.    LATEEF, MUNSOOR 9/24/20169:53 AM

## 2015-08-13 NOTE — Progress Notes (Signed)
[facility] [place] at [facility]   PATIENT NAME: Courtney [name]    MR#:  [id number]  DATE OF BIRTH:  21-Sep-1966  SUBJECTIVE:  CHIEF COMPLAINT:  Patient is lethargic. Blood pressure is better. Husband is at bedside. Had bedside sacral decub  debridement  REVIEW OF SYSTEMS:  Unable to obtain review of systems DRUG ALLERGIES:   Allergies  Allergen Reactions  . Contrast Media [Iodinated Diagnostic Agents] Anaphylaxis  . Ampicillin Rash    VITALS:  Blood pressure 115/37, pulse 83, temperature 98.9 F (37.2 C), temperature source Axillary, resp. rate 21, height  (1.753 m), weight 74.571 kg (164 lb 6.4 oz), SpO2 100 %.  PHYSICAL EXAMINATION:  GENERAL:  49 y.o.-year-old patient lying in the bed with no acute distress.  EYES: Pupils equal, round, reactive to light and accommodation. No scleral icterus. Extraocular muscles intact.  HEENT: Head atraumatic, normocephalic. Oropharynx and nasopharynx clear. Trach site is intact NECK:  Supple, no jugular venous distention. No thyroid enlargement, no tenderness.  LUNGS: Moderate air entry, decreased breath sounds at the bases, no wheezing, rales,rhonchi or crepitation. No use of accessory muscles of respiration.  CARDIOVASCULAR: S1, S2 normal. No murmurs, rubs, or gallops.  ABDOMEN: Soft, nondistended. Bowel sounds present.  EXTREMITIES: No pedal edema, cyanosis, or clubbing.  NEUROLOGIC: Patient is with altered mental status  PSYCHIATRIC: The patient is disoriented SKIN: Large sacral decub with his ulcer with necrotic tissue. No obvious rash   LABORATORY PANEL:   CBC  Recent Labs Lab 08/13/15 0250  WBC 30.3*  HGB 8.7*  HCT 27.4*  PLT 46*   ------------------------------------------------------------------------------------------------------------------  Chemistries   Recent Labs Lab Sep 05, 2015 0850 08/13/15 0250  NA 141 138  K 2.3* 3.4*  CL 103 103  CO2 30 28  GLUCOSE 87 123*  BUN  17 19  CREATININE 1.29* 1.41*  CALCIUM 7.1* 7.5*  MG 1.4* 1.8  AST 16  --   ALT 13*  --   ALKPHOS 175*  --   BILITOT 0.9  --    ------------------------------------------------------------------------------------------------------------------  Cardiac Enzymes  Recent Labs Lab 05-Sep-2015 0850  TROPONINI 0.22*   ------------------------------------------------------------------------------------------------------------------  RADIOLOGY:  Dg Chest 1 View  09/05/15   CLINICAL DATA:  Hypotension. Unresponsive patient with tracheostomy in place.  EXAM: CHEST 1 VIEW  COMPARISON:  08/02/2015  FINDINGS: Cardiac silhouette is top-normal in size. No mediastinal or hilar masses or evidence of adenopathy. Lungs are clear. No pleural effusion or pneumothorax.  Right internal jugular dual-lumen central venous catheter is stable with its distal tip in the right atrium. Left internal jugular small caliber dual-lumen central venous catheter is stable with its tip in the mid superior vena cava.  Tracheostomy tube is well positioned and also stable.  IMPRESSION: 1. Tracheostomy tube and central venous lines are stable as detailed above. 2. No acute cardiopulmonary disease.   Electronically Signed   By: Amie Portland M.D.   On: 05-Sep-2015 09:24   Ct Head Wo Contrast  September 05, 2015   CLINICAL DATA:  Unresponsive patient.  Leftward gaze.  EXAM: CT HEAD WITHOUT CONTRAST  TECHNIQUE: Contiguous axial images were obtained from the base of the skull through the vertex without intravenous contrast.  COMPARISON:  MRI 01/28/2012.  FINDINGS: No mass lesion, mass effect, midline shift, hydrocephalus, hemorrhage. No territorial ischemia or acute infarction. Unchanged small dilated perivascular spaces are noted in the basal ganglia.  IMPRESSION: Negative CT head.   Electronically Signed   By: Charolette Child.D.  On: 09/05/15 10:11   Dg Chest Port 1 View  08/13/2015   CLINICAL DATA:  Respiratory failure  EXAM: PORTABLE  CHEST 1 VIEW  COMPARISON:  09-05-15  FINDINGS: Support devices are unchanged. Cardiomegaly with vascular congestion. Left lower lobe atelectasis or infiltrate has increased. Suspect small left effusion. No confluent opacity on the right.  IMPRESSION: Increasing left lower lobe atelectasis or infiltrate with small left effusion.   Electronically Signed   By: Charlett Nose M.D.   On: 08/13/2015 09:31    EKG:   Orders placed or performed in visit on Sep 05, 2015  . EKG 12-Lead    ASSESSMENT AND PLAN:   1. Septic shock. Probably from Patient has a stage IV foul-smelling sacral decubiti, left lower lobe infiltrate versus atelectasis dialysis catheter and also central line.  Continue aggressive antibiotics with vancomycin and cefepime and Levaquin. Follow-up urinalysis and urine culture. We'll wean off  levophed as blood pressure is better. Hold all antihypertensives medication Appreciate Critical care recommendations.   2. Acute encephalopathy- likely from problem #1/? Seizures Initial CT scan was negative.  Patient has no history of seizure.  Status post EEG results are pending Appreciate neurology recommendations 3. symptomatic anemia-   status post1 unit of blood transfusion and hemoglobin is at 8.4 today. Will repeat CBC in a.m 4. Hypokalemia- replace potassium IV. Check magnesium 5. End-stage renal disease on hemodialysis- patient is still hypotensive though it is better at this time will consider hemodialysis in a.m. Marland Kitchenappreciate nephrology recommendations  6. Oxygenate with trach collar at this point 7. Elevated troponin likely demand ischemia with septic shock 8. Stage IV sacral decubitus- status post bedside wound debridement. Appreciate surgery recommendations.  9. Thrombocytopenia- no acute bleeding. Platelet count is at 46,000 today.Will hold off on heparin subcutaneous at this point and just go with the SCDs and teds. This could be secondary to sepsis versus Hit 10. Nutrition- we will  consider diet when the patient is more awake and alert probably tomorrow    All the records are reviewed and case discussed with Care Management/Social Workerr. Management plans discussed with the patient, family and they are in agreement.  CODE STATUS: Full code  TOTAL CRITICAL CARE TIME TAKING CARE OF THIS PATIENT: 35 minutes.   POSSIBLE D/C IN ? DAYS, DEPENDING ON CLINICAL CONDITION.   Ramonita Lab M.D on 08/13/2015 at 1:52 PM  Between 7am to 6pm - Pager - [phone] After 6pm go to [email or web address] - password [email or web address] [facility]  [place]  Office  [phone]  CC: Primary care physician; [name], MD

## 2015-08-13 NOTE — Progress Notes (Signed)
ELECTROLYTE MANAGEMENT - INITIAL  Pharmacy Consult for Electrolyte Management Indication: hypokalemia  Allergies  Allergen Reactions  . Contrast Media [Iodinated Diagnostic Agents] Anaphylaxis  . Ampicillin Rash    Patient Measurements: Height:  (175.3 cm) Weight: 164 lb 6.4 oz (74.571 kg) IBW/kg (Calculated) : 66.2   Vital Signs: Temp: 100.2 F (37.9 C) (09/24 0734) Temp Source: Axillary (09/24 0734) BP: 124/34 mmHg (09/24 0700) Pulse Rate: 84 (09/24 0700) Intake/Output from previous day: 09/23 0701 - 09/24 0700 In: 1728 [I.V.:344; Blood:634; IV Piggyback:750] Out: -  Intake/Output from this shift:    Labs:  Recent Labs  Aug 25, 2015 0850 08/13/15 0250  WBC 15.7* 30.3*  HGB 6.4* 8.7*  HCT 20.5* 27.4*  PLT 52* 46*     Recent Labs  08-25-15 0850 08/13/15 0250  NA 141 138  K 2.3* 3.4*  CL 103 103  CO2 30 28  GLUCOSE 87 123*  BUN 17 19  CREATININE 1.29* 1.41*  CALCIUM 7.1* 7.5*  MG 1.4* 1.8  PROT 4.4*  --   ALBUMIN 1.6*  --   AST 16  --   ALT 13*  --   ALKPHOS 175*  --   BILITOT 0.9  --    Estimated Creatinine Clearance: 49.9 mL/min (by C-G formula based on Cr of 1.41).    Recent Labs  25-Aug-2015 1950 08/13/15 0009 08/13/15 0401  GLUCAP 117* 141* 99    Medical History: Past Medical History  Diagnosis Date  . Obesity   . Dyslipidemia   . Hypertension   . Coronary artery disease     s/p BMS 2010 LAD  . Dysrhythmia     ventricular tachycardia resolved after LAD stent and beta blocker  . Diabetes mellitus     with retinopathy, neuropathy and microalbuminemia  . ESRD (end stage renal disease) on dialysis     Medications:  Scheduled:  . sodium chloride   Intravenous Once  . aztreonam  1 g Intravenous Q24H  . hydrocortisone sod succinate (SOLU-CORTEF) inj  50 mg Intravenous Q6H  . insulin aspart  0-15 Units Subcutaneous 6 times per day  . meropenem (MERREM) IV  500 mg Intravenous Q24H  . potassium chloride  10 mEq Intravenous Q1 Hr  x 4  . sodium chloride  3 mL Intravenous Q12H  . sodium hypochlorite   Irrigation TID  . vancomycin  750 mg Intravenous Q T,Th,Sa-HD    Assessment: 49 yo female admitted for sepsis, found to have hypokalemia. Pharmacy consulted for electrolyte management.  9/24  K = 3.4  Plan:  Will give KCl 40 meq IV.  Will check Potassium level in AM.    Stormy Card, [facility] Clinical Pharmacist 08/13/2015,7:35 AM

## 2015-08-13 NOTE — Progress Notes (Signed)
Initial Nutrition Assessment    INTERVENTION:   Meals and Snacks: once diet advanced, will add high protein snacks between meals  Medical Food Supplement Therapy: once diet advanced, will add Prostat supplement, pt likes to take with cranberry juice  NUTRITION DIAGNOSIS:   Inadequate oral intake related to wound healing, acute illness, chronic illness as evidenced by NPO status, estimated needs.  GOAL:   Patient will meet greater than or equal to 90% of their needs  MONITOR:    (Energy Intake, Anthropometrics, Digestive System, Electrolyte/Renal Profile, Glucose Profile)  REASON FOR ASSESSMENT:   Rounds (Pressure Ulcer)    ASSESSMENT:     Pt admitted septic shock on levophed, acute encephalopathy, infected stage IV sacral decub s/p debridment this AM, chronic trach currently on trach collar; pt with ESRD on HD, no plans for HD today. Pt lethargic but able to nod head to husband's voice on visit today  Past Medical History  Diagnosis Date  . Obesity   . Dyslipidemia   . Hypertension   . Coronary artery disease     s/p BMS 2010 LAD  . Dysrhythmia     ventricular tachycardia resolved after LAD stent and beta blocker  . Diabetes mellitus     with retinopathy, neuropathy and microalbuminemia  . ESRD (end stage renal disease) on dialysis      Diet Order:  Diet NPO time specified   Food and Nutrition Related History: family at bedside; they report that since being on megace (started at [facility]) appetite has been good, pt ravenous and wanting to eat every 15-20 minutes although pt now a picky eater due to altered taste; unclear picture from husband as to how well pt has been eating. Husband Report pt takes Prostat 2-3 times per day, prefers snacks between meals over nutritional supplements  Skin:   (stage IV ulcer on foot, unstageable on sacrum)  Last BM:  9/23   Electrolyte and Renal Profile:  Recent Labs Lab Sep 03, 2015 0850 08/13/15 0250  BUN 17 19  CREATININE 1.29*  1.41*  NA 141 138  K 2.3* 3.4*  MG 1.4* 1.8   Glucose Profile:   Recent Labs  08/13/15 0401 08/13/15 0725 08/13/15 1116  GLUCAP 99 112* 102*   Nutrition Foucsed Physical Exam:  Unable to complete Nutrition-Focused physical exam at this time.    Height:   Ht Readings from Last 1 Encounters:  03-Sep-2015  (1.753 m)    Weight: pt with significant wt loss since 2013 but partially due to gastric bypass; husband seems to think weight recently has been relatively stable  Wt Readings from Last 1 Encounters:  Sep 03, 2015 164 lb 6.4 oz (74.571 kg)    Wt Readings from Last 10 Encounters:  09-03-15 164 lb 6.4 oz (74.571 kg)  01/31/12 319 lb 8 oz (144.924 kg)    BMI:  Body mass index is 24.27 kg/(m^2).  Estimated Nutritional Needs:   Kcal:  1610-9604 kcals (BEE 1434, 1.2 AF, 1.2-1.4 IF)   Protein:  113-150 g (1.5-2.0 g/kg)   Fluid:  1000 mL plus UOP  HIGH Care Level  Romelle Starcher MS, RD, LDN [phone] Pager

## 2015-08-14 LAB — GLUCOSE, CAPILLARY
Glucose-Capillary: 75 mg/dL (ref 65–99)
Glucose-Capillary: 80 mg/dL (ref 65–99)
Glucose-Capillary: 84 mg/dL (ref 65–99)
Glucose-Capillary: 87 mg/dL (ref 65–99)
Glucose-Capillary: 90 mg/dL (ref 65–99)
Glucose-Capillary: 94 mg/dL (ref 65–99)

## 2015-08-14 LAB — CBC WITH DIFFERENTIAL/PLATELET
Basophils Absolute: 0 10*3/uL (ref 0–0.1)
Basophils Relative: 0 %
EOS ABS: 0 10*3/uL (ref 0–0.7)
HCT: 21.4 % — ABNORMAL LOW (ref 35.0–47.0)
Hemoglobin: 6.8 g/dL — ABNORMAL LOW (ref 12.0–16.0)
LYMPHS ABS: 0.5 10*3/uL — AB (ref 1.0–3.6)
MCH: 29.3 pg (ref 26.0–34.0)
MCHC: 31.6 g/dL — ABNORMAL LOW (ref 32.0–36.0)
MCV: 92.8 fL (ref 80.0–100.0)
Monocytes Absolute: 0.3 10*3/uL (ref 0.2–0.9)
Neutro Abs: 14 10*3/uL — ABNORMAL HIGH (ref 1.4–6.5)
Neutrophils Relative %: 95 %
PLATELETS: 43 10*3/uL — AB (ref 150–440)
RBC: 2.3 MIL/uL — AB (ref 3.80–5.20)
RDW: 17.1 % — ABNORMAL HIGH (ref 11.5–14.5)
WBC: 14.8 10*3/uL — ABNORMAL HIGH (ref 3.6–11.0)

## 2015-08-14 LAB — BASIC METABOLIC PANEL
ANION GAP: 6 (ref 5–15)
BUN: 27 mg/dL — ABNORMAL HIGH (ref 6–20)
CALCIUM: 7.5 mg/dL — AB (ref 8.9–10.3)
CO2: 28 mmol/L (ref 22–32)
CREATININE: 1.8 mg/dL — AB (ref 0.44–1.00)
Chloride: 102 mmol/L (ref 101–111)
GFR calc Af Amer: 37 mL/min — ABNORMAL LOW (ref >60)
GFR, EST NON AFRICAN AMERICAN: 32 mL/min — AB (ref >60)
GLUCOSE: 95 mg/dL (ref 65–99)
Potassium: 4.2 mmol/L (ref 3.5–5.1)
Sodium: 136 mmol/L (ref 135–145)

## 2015-08-14 LAB — CBC
HCT: 24.9 % — ABNORMAL LOW (ref 35.0–47.0)
Hemoglobin: 7.9 g/dL — ABNORMAL LOW (ref 12.0–16.0)
MCH: 29.5 pg (ref 26.0–34.0)
MCHC: 31.5 g/dL — AB (ref 32.0–36.0)
MCV: 93.4 fL (ref 80.0–100.0)
PLATELETS: 18 10*3/uL — AB (ref 150–440)
RBC: 2.67 MIL/uL — ABNORMAL LOW (ref 3.80–5.20)
RDW: 17 % — AB (ref 11.5–14.5)
WBC: 21 10*3/uL — ABNORMAL HIGH (ref 3.6–11.0)

## 2015-08-14 LAB — PHOSPHORUS: Phosphorus: 1.6 mg/dL — ABNORMAL LOW (ref 2.5–4.6)

## 2015-08-14 LAB — PREPARE RBC (CROSSMATCH)

## 2015-08-14 MED ORDER — HEPARIN SODIUM (PORCINE) 1000 UNIT/ML IJ SOLN
4500.0000 [IU] | Freq: Once | INTRAMUSCULAR | Status: AC
  Administered 2015-08-14: 4500 [IU] via INTRAVENOUS

## 2015-08-14 MED ORDER — ALTEPLASE 2 MG IJ SOLR: 2.0000 mg | Freq: Once | INTRAMUSCULAR | Status: DC | PRN

## 2015-08-14 MED ORDER — HYDROCORTISONE NA SUCCINATE PF 100 MG IJ SOLR
50.0000 mg | Freq: Two times a day (BID) | INTRAMUSCULAR | Status: DC
  Administered 2015-08-14 – 2015-08-16 (×4): 50 mg via INTRAVENOUS
  Filled 2015-08-14 (×4): qty 2

## 2015-08-14 MED ORDER — SODIUM CHLORIDE 0.9 % IV SOLN
Freq: Once | INTRAVENOUS | Status: AC
  Administered 2015-08-14: 23:00:00 via INTRAVENOUS

## 2015-08-14 MED ORDER — PENTAFLUOROPROP-TETRAFLUOROETH EX AERO: 1.0000 "application " | INHALATION_SPRAY | CUTANEOUS | Status: DC | PRN

## 2015-08-14 MED ORDER — LIDOCAINE-PRILOCAINE 2.5-2.5 % EX CREA: 1.0000 "application " | TOPICAL_CREAM | CUTANEOUS | Status: DC | PRN

## 2015-08-14 MED ORDER — SODIUM CHLORIDE 0.9 % IV SOLN
1.0000 g | Freq: Two times a day (BID) | INTRAVENOUS | Status: DC
  Filled 2015-08-14 (×3): qty 1

## 2015-08-14 MED ORDER — LIDOCAINE HCL (PF) 1 % IJ SOLN
5.0000 mL | INTRAMUSCULAR | Status: DC | PRN
  Filled 2015-08-14: qty 5

## 2015-08-14 MED ORDER — SODIUM CHLORIDE 0.9 % IV SOLN
500.0000 mg | INTRAVENOUS | Status: DC
  Administered 2015-08-14 – 2015-08-15 (×2): 500 mg via INTRAVENOUS
  Filled 2015-08-14 (×3): qty 0.5

## 2015-08-14 MED ORDER — INSULIN ASPART 100 UNIT/ML ~~LOC~~ SOLN
0.0000 [IU] | Freq: Three times a day (TID) | SUBCUTANEOUS | Status: DC
  Administered 2015-08-16: 5 [IU] via SUBCUTANEOUS
  Filled 2015-08-14: qty 5

## 2015-08-14 MED ORDER — SODIUM CHLORIDE 0.9 % IV SOLN: 100.0000 mL | INTRAVENOUS | Status: DC | PRN

## 2015-08-14 MED ORDER — HEPARIN SODIUM (PORCINE) 1000 UNIT/ML IJ SOLN: 1000.0000 [IU] | INTRAMUSCULAR | Status: DC | PRN

## 2015-08-14 MED ORDER — SODIUM CHLORIDE 0.9 % IV SOLN
Freq: Once | INTRAVENOUS | Status: DC
Start: 2015-08-14 — End: 2015-08-20

## 2015-08-14 MED ORDER — SODIUM CHLORIDE 0.9 % IV SOLN
100.0000 mL | INTRAVENOUS | Status: DC | PRN
Start: 2015-08-14 — End: 2015-09-17

## 2015-08-14 NOTE — Progress Notes (Signed)
Dialysis RN at bedside to begin Dialysis treatment.

## 2015-08-14 NOTE — Progress Notes (Signed)
[facility]  ROUNDING NOTE   Subjective:  Pt still in CCU. BUN and Cr are rising. K improved to 4.2 this AM.  Norepinephrine being weaned down.  Objective:  Vital signs in last 24 hours:  Temp:  [98.4 F (36.9 C)-99.3 F (37.4 C)] 98.4 F (36.9 C) (09/25 0500) Pulse Rate:  [75-89] 84 (09/25 0600) Resp:  [16-28] 20 (09/25 0600) BP: (74-143)/(29-64) 115/39 mmHg (09/25 0600) SpO2:  [97 %-100 %] 97 % (09/25 0600) Arterial Line BP: (106-179)/(48-80) 136/63 mmHg (09/25 0600) FiO2 (%):  [28 %] 28 % (09/24 2030)  Weight change:  Filed Weights   08/08/2015 0847 07/28/2015 1300  Weight: 86.183 kg (190 lb) 74.571 kg (164 lb 6.4 oz)    Intake/Output: I/O last 3 completed shifts: In: 2445.1 [I.V.:1711.1; Blood:634; IV Piggyback:100] Out: 0    Intake/Output this shift:     Physical Exam: General: Chronically ill appearing  Head: Normocephalic, atraumatic. Moist oral mucosal membranes  Eyes: Eyes closed  Neck: Trach in place  Lungs:  Scattered rhonchi, normal effort  Heart: S1S2 no rubs  Abdomen:  Soft, nontender, BS present  Extremities:  trace peripheral edema  Neurologic: Lethargic but arousable  Skin: No lesions  Access: R IJ permcath    Basic Metabolic Panel:  Recent Labs Lab 08/15/2015 0850 08/13/15 0250 08/14/15 0510  NA 141 138 136  K 2.3* 3.4* 4.2  CL 103 103 102  CO2 GLUCOSE 87 123* 95  BUN 17 19 27*  CREATININE 1.29* 1.41* 1.80*  CALCIUM 7.1* 7.5* 7.5*  MG 1.4* 1.8  --     Liver Function Tests:  Recent Labs Lab 08/02/2015 0850  AST 16  ALT 13*  ALKPHOS 175*  BILITOT 0.9  PROT 4.4*  ALBUMIN 1.6*   No results for input(s): LIPASE, AMYLASE in the last 168 hours. No results for input(s): AMMONIA in the last 168 hours.  CBC:  Recent Labs Lab 07/22/2015 0850 08/13/15 0250 08/14/15 0510  WBC 15.7* 30.3* 21.0*  NEUTROABS 13.8*  --   --   HGB 6.4* 8.7* 7.9*  HCT 20.5* 27.4* 24.9*  MCV 95.5 93.3 93.4  PLT 52* 46* 18*     Cardiac Enzymes:  Recent Labs Lab 08/10/2015 0850  TROPONINI 0.22*    BNP: Invalid input(s): POCBNP  CBG:  Recent Labs Lab 08/13/15 1606 08/13/15 2047 08/14/15 0037 08/14/15 0417 08/14/15 0712  GLUCAP 122* 97 94 80 84    Microbiology: Results for orders placed or performed during the hospital encounter of 08/15/2015  Blood Culture (routine x 2)     Status: None (Preliminary result)   Collection Time: 08/11/2015  8:51 AM  Result Value Ref Range Status   Specimen Description BLOOD UNKO  Final   Special Requests BOTTLES DRAWN AEROBIC AND ANAEROBIC  3CC  Final   Culture NO GROWTH 2 DAYS  Final   Report Status PENDING  Incomplete  Blood Culture (routine x 2)     Status: None (Preliminary result)   Collection Time: 08/06/2015  9:20 AM  Result Value Ref Range Status   Specimen Description BLOOD LEFT ARM  Final   Special Requests BOTTLES DRAWN AEROBIC AND ANAEROBIC  1CC  Final   Culture NO GROWTH 2 DAYS  Final   Report Status PENDING  Incomplete  Wound culture     Status: None (Preliminary result)   Collection Time: 08/11/2015  9:20 AM  Result Value Ref Range Status   Specimen Description DECUBITIS  Final  Special Requests Normal  Final   Gram Stain   Final    FEW WBC SEEN MANY GRAM NEGATIVE RODS RARE GRAM POSITIVE COCCI    Culture HOLDING FOR POSSIBLE PATHOGEN  Final   Report Status PENDING  Incomplete  MRSA PCR Screening     Status: None   Collection Time: 08/16/2015  2:38 PM  Result Value Ref Range Status   MRSA by PCR NEGATIVE NEGATIVE Final    Comment:        The GeneXpert MRSA Assay (FDA approved for NASAL specimens only), is one component of a comprehensive MRSA colonization surveillance program. It is not intended to diagnose MRSA infection nor to guide or monitor treatment for MRSA infections.   Blood culture (single)     Status: None (Preliminary result)   Collection Time: 07/31/2015  3:36 PM  Result Value Ref Range Status   Specimen Description BLOOD  RIGHT ASSIST CONTROL  Final   Special Requests BOTTLES DRAWN AEROBIC AND ANAEROBIC  Final   Culture NO GROWTH 2 DAYS  Final   Report Status PENDING  Incomplete    Coagulation Studies: No results for input(s): LABPROT, INR in the last 72 hours.  Urinalysis: No results for input(s): COLORURINE, LABSPEC, PHURINE, GLUCOSEU, HGBUR, BILIRUBINUR, KETONESUR, PROTEINUR, UROBILINOGEN, NITRITE, LEUKOCYTESUR in the last 72 hours.  Invalid input(s): APPERANCEUR    Imaging: Ct Head Wo Contrast  08/17/2015   CLINICAL DATA:  Unresponsive patient.  Leftward gaze.  EXAM: CT HEAD WITHOUT CONTRAST  TECHNIQUE: Contiguous axial images were obtained from the base of the skull through the vertex without intravenous contrast.  COMPARISON:  MRI 01/28/2012.  FINDINGS: No mass lesion, mass effect, midline shift, hydrocephalus, hemorrhage. No territorial ischemia or acute infarction. Unchanged small dilated perivascular spaces are noted in the basal ganglia.  IMPRESSION: Negative CT head.   Electronically Signed   By: Andreas Newport M.D.   On: 08/01/2015 10:11   Dg Chest Port 1 View  08/13/2015   CLINICAL DATA:  Respiratory failure  EXAM: PORTABLE CHEST 1 VIEW  COMPARISON:  08/10/2015  FINDINGS: Support devices are unchanged. Cardiomegaly with vascular congestion. Left lower lobe atelectasis or infiltrate has increased. Suspect small left effusion. No confluent opacity on the right.  IMPRESSION: Increasing left lower lobe atelectasis or infiltrate with small left effusion.   Electronically Signed   By: Charlett Nose M.D.   On: 08/13/2015 09:31     Medications:   . norepinephrine (LEVOPHED) Adult infusion 1 mcg/min (08/13/15 1637)   . sodium chloride   Intravenous Once  . albumin human  25 g Intravenous Once  . hydrocortisone sod succinate (SOLU-CORTEF) inj  50 mg Intravenous Q8H  . insulin aspart  0-15 Units Subcutaneous 6 times per day  . meropenem (MERREM) IV  500 mg Intravenous Q24H  . sodium chloride  3  mL Intravenous Q12H  . sodium hypochlorite   Irrigation TID   Place/Maintain arterial line **AND** sodium chloride  Assessment/ Plan:  49 y.o. 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  1. End-stage renal disease on hemodialysis on HD TTHS. The patient has  been on dialysis since October of 2014.  -pressors have been weaned down, BUN/Cr rising, last HD was on Thursday, will plan for HD today, minimal UF to be planned. Will use R IJ permcath.   2. Anemia of CKD: received blood transfusion this admission.  3. Secondary hyperparathyroidism: draw phos and PTH today.  4. Septic shock: Multiple potential sources, has two catheters in place, also has a sacral decubitis, and also has trach in place.  -blood cultures thus far negative.  Continue meropenem at this time.  5. Hypokalemia: resolved with repletion, will plan for 4K bath with HD today.  6.  Thrombocytopenia:  Platelets declining, would consider HIT panel, defer further work up to pulm/cc and hospitalist.    LOS: 2 Grey Schlauch 9/25/20169:31 AM

## 2015-08-14 NOTE — Progress Notes (Signed)
[facility] - [place] at [facility]   PATIENT NAME: Courtney [name]    MR#:  [id number]  DATE OF BIRTH:  1966-06-08  SUBJECTIVE:  CHIEF COMPLAINT:  Patient is lethargic. Opens her eyes to verbal commands but not communicating. Had bedside sacral decub  debridement  REVIEW OF SYSTEMS:  Unable to obtain review of systems DRUG ALLERGIES:   Allergies  Allergen Reactions  . Contrast Media [Iodinated Diagnostic Agents] Anaphylaxis  . Ampicillin Rash    VITALS:  Blood pressure 107/53, pulse 84, temperature 99 F (37.2 C), temperature source Axillary, resp. rate 18, height  (1.753 m), weight 80.7 kg (177 lb 14.6 oz), SpO2 99 %.  PHYSICAL EXAMINATION:  GENERAL:  49 y.o.-year-old patient lying in the bed with no acute distress.  EYES: Pupils equal, round, reactive to light and accommodation. No scleral icterus. Extraocular muscles intact.  HEENT: Head atraumatic, normocephalic. Oropharynx and nasopharynx clear. Trach site is intact NECK:  Supple, no jugular venous distention. No thyroid enlargement, no tenderness.  LUNGS: Moderate air entry, decreased breath sounds at the bases, no wheezing, rales,rhonchi or crepitation. No use of accessory muscles of respiration.  CARDIOVASCULAR: S1, S2 normal. No murmurs, rubs, or gallops.  ABDOMEN: Soft, nondistended. Bowel sounds present.  EXTREMITIES: No pedal edema, cyanosis, or clubbing.  NEUROLOGIC: Patient is with altered mental status  PSYCHIATRIC: The patient is disoriented SKIN: Large sacral decub with his ulcer with necrotic tissue. No obvious rash   LABORATORY PANEL:   CBC  Recent Labs Lab 08/14/15 0510  WBC 21.0*  HGB 7.9*  HCT 24.9*  PLT 18*   ------------------------------------------------------------------------------------------------------------------  Chemistries   Recent Labs Lab 07/30/2015 0850 08/13/15 0250 08/14/15 0510  NA 141 138 136  K 2.3* 3.4* 4.2  CL 103 103 102   CO2 GLUCOSE 87 123* 95  BUN 17 19 27*  CREATININE 1.29* 1.41* 1.80*  CALCIUM 7.1* 7.5* 7.5*  MG 1.4* 1.8  --   AST 16  --   --   ALT 13*  --   --   ALKPHOS 175*  --   --   BILITOT 0.9  --   --    ------------------------------------------------------------------------------------------------------------------  Cardiac Enzymes  Recent Labs Lab 07/30/2015 0850  TROPONINI 0.22*   ------------------------------------------------------------------------------------------------------------------  RADIOLOGY:  Dg Chest Port 1 View  08/13/2015   CLINICAL DATA:  Respiratory failure  EXAM: PORTABLE CHEST 1 VIEW  COMPARISON:  08/01/2015  FINDINGS: Support devices are unchanged. Cardiomegaly with vascular congestion. Left lower lobe atelectasis or infiltrate has increased. Suspect small left effusion. No confluent opacity on the right.  IMPRESSION: Increasing left lower lobe atelectasis or infiltrate with small left effusion.   Electronically Signed   By: Charlett Nose M.D.   On: 08/13/2015 09:31    EKG:   Orders placed or performed in visit on 07/21/2015  . EKG 12-Lead  . EKG 12-Lead    ASSESSMENT AND PLAN:   1. Septic shock. Probably from stage IV foul-smelling sacral decubiti, left lower lobe infiltrate versus atelectasis Patient is started on meropenem and DC'd vancomycin and cefepime and Levaquin. Follow-up urinalysis and urine culture. off  levophed as blood pressure is better. Hold all antihypertensives medication Appreciate Critical care recommendations.   2. Acute encephalopathy- likely from problem #1/? Seizures Initial CT scan was negative.  Patient has no history of seizure.  Status post EEG results are pending Appreciate neurology recommendations  3. symptomatic anemia-   status post1 unit of  blood transfusion and hemoglobin is at 8.4 today. Will repeat CBC in a.m 4. Thrombocytopenia- no acute bleeding. Platelet count is at 18,000 today.Will hold off on heparin  subcutaneous at this point Transfuse platelets if platelet count is less than or equal to 10,000 HIT panel is ordered Oncology consult is placed Will stop using heparin during dialysis, discussed with nephrology they would rather use citrate instead of heparin  5. End-stage renal disease on hemodialysis- patient is getting hemodialysis today  6. Oxygenate with trach collar at this point 7. Elevated troponin likely demand ischemia with septic shock 8. Stage IV sacral decubitus- status post bedside wound debridement. Appreciate surgery recommendations. Patient needs wound VAC eventually. Follow-up on the cultures obtained from the ED and obtained from yesterday during debridement 9. Hypokalemia- replace potassium IV. Check magnesium 10. Nutrition- we will consider diet when the patient is more awake and alert    All the records are reviewed and case discussed with Care Management/Social Workerr. Management plans discussed with the patient, family and they are in agreement.  CODE STATUS: Full code  TOTAL CRITICAL CARE TIME TAKING CARE OF THIS PATIENT: 35 minutes.   POSSIBLE D/C IN ? DAYS, DEPENDING ON CLINICAL CONDITION.   Ramonita Lab M.D on 08/14/2015 at 1:28 PM  Between 7am to 6pm - Pager - [phone] After 6pm go to [email or web address] - password [email or web address] [facility]  [facility]  Office  [phone]  CC: Primary care physician; [name], MD

## 2015-08-14 NOTE — Progress Notes (Signed)
PULMONARY / CRITICAL CARE MEDICINE   Name: Courtney [name] MRN: [id number] DOB: [birth year]    ADMISSION DATE:  07/27/2015 CONSULTATION DATE: 9/23  INITIAL PRESENTATION:  72 F who has been in medical facilities (hosp, LTAC, rehab) for 2 yrs following gastric bypass surgery with multiple complications. Now with chronic trach, ESRD, profound debilitation, severe sacral pressure ulcer. Was seemingly making progress and transferred to rehab facility approx one week prior to this admission. She was sent to [facility] ED with AMS and hypotension. Working dx of severe sepsis/septic shock due to infected sacral pressure ulcer. Also has R side externalized HD cath and tunneled L IJ CVL. She has history of resistant bacterial infections. She is on chronic steroids. Her husband indicates that she has been diagnosed with adrenal insuff at some point during the past 2 yrs  MAJOR EVENTS/TEST RESULTS: 9/23 CT head: NAD 9/23 EEG: no epileptiform activity 9/23 PRBCs for Hgb 6.4 9/24 bedside debridement of sacral wound. Abscess drained 9/25 Off vasopressors. More alert. No distress. Worsening thrombocytopenia. Vanc DC'd  INDWELLING DEVICES:: Trach (chronic)  Tunneled R IJ HD cath (chronic) Tunneled L IJ CVL (chronic) L femoral A-line 9/23 >> 9/25  MICRO DATA: MRSA PCR 9/23 >> NEG Urine 9/23 >>  Wound (swab) 9/23 >> many GNR, rare GPC >>  Blood 9/23 >>  Wound (debridement) 9/24 >> heavy GNR >>    ANTIMICROBIALS:  Aztreonam 9/23 >> 9/24 Vanc 9/23 >> 9/25 Meropenem 9/23 >>    SUBJECTIVE:  RASS 0, -1. + F/C  VITAL SIGNS: Temp:  [98.4 F (36.9 C)-99.3 F (37.4 C)] 99 F (37.2 C) (09/25 0900) Pulse Rate:  [75-89] 81 (09/25 1100) Resp:  [16-28] 20 (09/25 1100) BP: (74-143)/(29-64) 113/51 mmHg (09/25 1100) SpO2:  [97 %-100 %] 99 % (09/25 1100) Arterial Line BP: (106-179)/(48-80) 146/70 mmHg (09/25 1100) FiO2 (%):  [28 %] 28 % (09/24 2030) HEMODYNAMICS:   VENTILATOR SETTINGS: Vent Mode:   [-]  FiO2 (%):  [28 %] 28 % INTAKE / OUTPUT:  Intake/Output Summary (Last 24 hours) at 08/14/15 1138 Last data filed at 08/14/15 0500  Gross per 24 hour  Intake 1711.05 ml  Output      0 ml  Net 1711.05 ml    PHYSICAL EXAMINATION: General: RASS 0, -1, + F/C, no respiratory distress Neuro: CNs intact, MAEs, DTRs symmetric HEENT: Cushingoid facies Neck: no JVD noted, trach site clean Cardiovascular: Regular, no M noted Lungs: No adventitious sounds Abdomen: Obese, soft, NT, diminished BS Ext: cool, diminished distal pulses, no edema Skin: very deep, malodorous sacral pressure ulcer  LABS:  CBC  Recent Labs Lab 08/01/2015 0850 08/13/15 0250 08/14/15 0510  WBC 15.7* 30.3* 21.0*  HGB 6.4* 8.7* 7.9*  HCT 20.5* 27.4* 24.9*  PLT 52* 46* 18*   Coag's No results for input(s): APTT, INR in the last 168 hours. BMET  Recent Labs Lab 08/09/2015 0850 08/13/15 0250 08/14/15 0510  NA 141 138 136  K 2.3* 3.4* 4.2  CL 103 103 102  CO2 BUN 17 19 27*  CREATININE 1.29* 1.41* 1.80*  GLUCOSE 87 123* 95   Electrolytes  Recent Labs Lab 08/02/2015 0850 08/13/15 0250 08/14/15 0510  CALCIUM 7.1* 7.5* 7.5*  MG 1.4* 1.8  --    Sepsis Markers  Recent Labs Lab 07/23/2015 0853 08/15/2015 1605  LATICACIDVEN 1.2 1.5   ABG  Recent Labs Lab 08/14/2015 1200 08/19/2015 1721  PHART 7.50* 7.48*  PCO2ART 42 42  PO2ART 157* 121*  Liver Enzymes  Recent Labs Lab 18-Aug-2015 0850  AST 16  ALT 13*  ALKPHOS 175*  BILITOT 0.9  ALBUMIN 1.6*   Cardiac Enzymes  Recent Labs Lab 08-18-2015 0850  TROPONINI 0.22*   Glucose  Recent Labs Lab 08/13/15 1116 08/13/15 1606 08/13/15 2047 08/14/15 0037 08/14/15 0417 08/14/15 0712  GLUCAP 102* 122* 97 94 80 84    CXR: NNF    ASSESSMENT / PLAN:  PULMONARY A: 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 HD 9/25  GASTROINTESTINAL A:  No issues P:   SUP: N/I unless requires vent support Begin D III diet with supervision 9/25  HEMATOLOGIC A:   Acute on chronic anemia - unclear if acute blood loss Thrombocytopenia, worsening. Etiology unclear. Was on heparin PTA P:  DVT px: SCDs Monitor CBC intermittently Transfuse per usual guidelines Check HIT panel 9/25 Might need platelet transfusion prior to next debridement  INFECTIOUS A:   Severe sepsis Sacral decubitus ulcer with abscess P:   Monitor temp, WBC count Micro and abx as above Surgery following  ENDOCRINE A:  DM 2, controlled Chronic prednisone therapy - chronic dose 10 mg BID Secondary adrenal insuff P:   CBGs and SSI - mod scale Stress dose steroids - wean as able based on BP  NEUROLOGIC A:   Acute encephalopathy, resolving P:   RASS goal: 0 Minimize sedating meds   FAMILY:    Billy Fischer, MD PCCM service Mobile [phone] Pager [phone]    08/14/2015, 11:38 AM

## 2015-08-14 NOTE — Clinical Social Work Note (Signed)
Clinical Social Work Assessment  Patient Details  Name: Courtney [name] MRN: [id number] Date of Birth: 28-Apr-1966  Date of referral:  08/14/15               Reason for consult:   (from [facility])                Permission sought to share information with:  Family Supports Permission granted to share information::     Name::      (husband Jonny Ruiz  [phone])  Agency::     Relationship::     Contact Information:     Housing/Transportation Living arrangements for the past 2 months:  Skilled Arts development officer) Source of Information:  Spouse Patient Interpreter Needed:  None Criminal Activity/Legal Involvement Pertinent to Current Situation/Hospitalization:  No - Comment as needed Significant Relationships:  Adult Children, Spouse, Other Family Members Lives with:  Minor Children, Spouse Do you feel safe going back to the place where you live?  Yes Need for family participation in patient care:  Yes (Comment)  Care giving concerns:  None at this time   Social Worker assessment / plan:  Patient is 49 year old female.  Patient is sleep, husband at bedside providing information.  Family lives in [place], they have two children 36 year old son and 92 year old daughter.  Per husband patient has been in and out of LTAC and hospitals for the past year and  .   Patient has been at [facility] for one week.  Patient also receives dialysis.   Husband sure of disposition at this time if patient will return to SNF or go to [facility].  States he will communicate with Child psychotherapist after talking with MD.     CSW will continue to follow patient and assist with ongoing and disposition needs.  Employment status:  Disabled (Comment on whether or not currently receiving Disability) Insurance information:  Managed Care PT Recommendations:  Not assessed at this time Information / Referral to community resources:   (none provided at this time)  Patient/Family's Response to care:   Husband unsure and undecided of patient's disposition location at this time.    Patient/Family's Understanding of and Emotional Response to Diagnosis, Current Treatment, and Prognosis:  Patient's husband understands patient will continue under medical work up and will communicate with social worker to assist with disposition.   Emotional Assessment Appearance:  Appears stated age Attitude/Demeanor/Rapport:  Unable to Assess Affect (typically observed):  Unable to Assess Orientation:   (unable to assess) Alcohol / Substance use:    Psych involvement (Current and /or in the community):  No (Comment)  Discharge Needs  Concerns to be addressed:  Denies Needs/Concerns at this time Readmission within the last 30 days:  No Current discharge risk:  Chronically ill Barriers to Discharge:  No Barriers Identified   Soundra Pilon, LCSW 08/14/2015, 5:05 PM Sammuel Hines. Theresia Majors, MSW Clinical Social Work Department Emergency Room [phone] 5:08 PM

## 2015-08-14 NOTE — Progress Notes (Signed)
Subjective: 49 year old critically ill female admitted to the medicine service. Patient still will only grimace to me when I moving her for her wound checks. She does not answer questions or has any verbal response at all. Improving clinically per nursing staff and chart review.  Objective: Vital signs in last 24 hours: Temp:  [98.4 F (36.9 C)-99.3 F (37.4 C)] 99 F (37.2 C) (09/25 0900) Pulse Rate:  [75-89] 84 (09/25 1215) Resp:  [16-29] 18 (09/25 1215) BP: (74-143)/(22-64) 107/53 mmHg (09/25 1215) SpO2:  [97 %-100 %] 99 % (09/25 1100) Arterial Line BP: (106-179)/(48-80) 146/70 mmHg (09/25 1100) FiO2 (%):  [28 %] 28 % (09/24 2030) Weight:  [80.7 kg (177 lb 14.6 oz)] 80.7 kg (177 lb 14.6 oz) (09/25 1140) Last BM Date: 09/10/2015  Intake/Output from previous day: 09/24 0701 - 09/25 0700 In: 1711.1 [I.V.:1611.1; IV Piggyback:100] Out: 0  Intake/Output this shift:    Physical exam:  Gen.: Remains on trach trial without any response other than grimacing Chest: Distant breath sounds with a heart is regular rate and rhythm Abdomen: No acute findings, soft and nondistended Skin: Large sacral decubitus and examined today with nursing staff. Improved from day prior when sharp debridement was performed. Skin edges appeared viable in all directions. There continues to be a foul smell as well as gray tissue in multiple areas. No palpable or visible abscess on exam today   Lab Results: CBC   Recent Labs  08/13/15 0250 08/14/15 0510  WBC 30.3* 21.0*  HGB 8.7* 7.9*  HCT 27.4* 24.9*  PLT 46* 18*   BMET  Recent Labs  08/13/15 0250 08/14/15 0510  NA 138 136  K 3.4* 4.2  CL 103 102  CO2 28 28  GLUCOSE 123* 95  BUN 19 27*  CREATININE 1.41* 1.80*  CALCIUM 7.5* 7.5*   PT/INR No results for input(s): LABPROT, INR in the last 72 hours. ABG  Recent Labs  2015/09/10 1200 2015-09-10 1721  PHART 7.50* 7.48*  HCO3 32.8* 31.3*    Studies/Results: Dg Chest Port 1  View  08/13/2015   CLINICAL DATA:  Respiratory failure  EXAM: PORTABLE CHEST 1 VIEW  COMPARISON:  2015/09/10  FINDINGS: Support devices are unchanged. Cardiomegaly with vascular congestion. Left lower lobe atelectasis or infiltrate has increased. Suspect small left effusion. No confluent opacity on the right.  IMPRESSION: Increasing left lower lobe atelectasis or infiltrate with small left effusion.   Electronically Signed   By: Charlett Nose M.D.   On: 08/13/2015 09:31    Anti-infectives: Anti-infectives    Start     Dose/Rate Route Frequency Ordered Stop   08/14/15 1930  meropenem (MERREM) 500 mg in sodium chloride 0.9 % 50 mL IVPB     500 mg 100 mL/hr over 30 Minutes Intravenous Every 24 hours 08/14/15 1042     08/14/15 1045  meropenem (MERREM) 1 g in sodium chloride 0.9 % 100 mL IVPB  Status:  Discontinued     1 g 200 mL/hr over 30 Minutes Intravenous Every 12 hours 08/14/15 1040 08/14/15 1041   08/13/15 1800  aztreonam (AZACTAM) 1 g in dextrose 5 % 50 mL IVPB  Status:  Discontinued     1 g 100 mL/hr over 30 Minutes Intravenous Every 24 hours 2015-09-10 1840 08/13/15 1021   08/13/15 1200  vancomycin (VANCOCIN) IVPB 750 mg/150 ml premix  Status:  Discontinued     750 mg 150 mL/hr over 60 Minutes Intravenous Every T-Th-Sa (Hemodialysis) Sep 10, 2015 1849 08/14/15 1610   08/13/15  0100  aztreonam (AZACTAM) 2 g in dextrose 5 % 50 mL IVPB  Status:  Discontinued     2 g 100 mL/hr over 30 Minutes Intravenous 3 times per day 08/14/2015 1820 07/23/2015 1822   08/18/2015 2200  cefTAZidime (FORTAZ) 2 g in dextrose 5 % 50 mL IVPB  Status:  Discontinued     2 g 100 mL/hr over 30 Minutes Intravenous 3 times per day 07/23/2015 1051 08/17/2015 1728   08/04/2015 2200  meropenem (MERREM) 1 g in sodium chloride 0.9 % 100 mL IVPB  Status:  Discontinued     1 g 200 mL/hr over 30 Minutes Intravenous 3 times per day 07/23/2015 1812 08/05/2015 1822   08/16/2015 2200  meropenem (MERREM) 500 mg in sodium chloride 0.9 % 50 mL IVPB   Status:  Discontinued     500 mg 100 mL/hr over 30 Minutes Intravenous 3 times per day 08/08/2015 1840 08/01/2015 1903   08/08/2015 1915  meropenem (MERREM) 500 mg in sodium chloride 0.9 % 50 mL IVPB  Status:  Discontinued     500 mg 100 mL/hr over 30 Minutes Intravenous Every 24 hours 08/09/2015 1903 08/14/15 1039   08/10/2015 1900  vancomycin (VANCOCIN) IVPB 750 mg/150 ml premix     750 mg 150 mL/hr over 60 Minutes Intravenous  Once 08/11/2015 1849 08/08/2015 2000   08/14/2015 1830  aztreonam (AZACTAM) injection 2 g  Status:  Discontinued     2 g Intramuscular 3 times per day 08/09/2015 1005 07/29/2015 1047   07/30/2015 1530  vancomycin (VANCOCIN) IVPB 750 mg/150 ml premix  Status:  Discontinued     750 mg 150 mL/hr over 60 Minutes Intravenous Every 12 hours 07/30/2015 1005 08/08/2015 1849   07/25/2015 1400  aztreonam (AZACTAM) 1 g in dextrose 5 % 50 mL IVPB  Status:  Discontinued     1 g 100 mL/hr over 30 Minutes Intravenous 3 times per day 08/18/2015 1048 07/26/2015 1051   08/08/2015 1130  levofloxacin (LEVAQUIN) IVPB 750 mg  Status:  Discontinued     750 mg 100 mL/hr over 90 Minutes Intravenous Every 24 hours 07/24/2015 1128 08/11/2015 1728   07/25/2015 1100  Levofloxacin (LEVAQUIN) IVPB 250 mg  Status:  Discontinued     250 mg 50 mL/hr over 60 Minutes Intravenous Every 24 hours 08/01/2015 1048 08/08/2015 1128   07/26/2015 0900  vancomycin (VANCOCIN) IVPB 1000 mg/200 mL premix     1,000 mg 200 mL/hr over 60 Minutes Intravenous  Once 07/25/2015 0849 07/27/2015 1108   08/17/2015 0900  aztreonam (AZACTAM) 2 g in dextrose 5 % 50 mL IVPB     2 g 100 mL/hr over 30 Minutes Intravenous  Once 07/29/2015 0849 07/28/2015 1108      Assessment/Plan:  49 year old critically ill female with surgical consultation for infected sacral decubitus ulcer.  The necrotic tissue was sharply debrided at the bedside yesterday with some improvement and areas on exam today. Skin appears viable at this time. Recommend continuing Dakin soaked wet-to-dry  dressing changes. May require further surgical debridement. Once the area free of infected tissue within be a candidate for a wound VAC. In the absence of clinical improvement this will be a chronic problem for this patient without any resolution in site.   Charles T. Tonita Cong, MD, FACS  08/14/2015

## 2015-08-14 NOTE — Progress Notes (Signed)
ELECTROLYTE MANAGEMENT - INITIAL  Pharmacy Consult for Electrolyte Management Indication: hypokalemia  Allergies  Allergen Reactions  . Contrast Media [Iodinated Diagnostic Agents] Anaphylaxis  . Ampicillin Rash    Patient Measurements: Height:  (175.3 cm) Weight: 164 lb 6.4 oz (74.571 kg) IBW/kg (Calculated) : 66.2   Vital Signs: Temp: 99.2 F (37.3 C) (09/25 0000) Temp Source: Oral (09/25 0000) BP: 143/52 mmHg (09/25 0500) Pulse Rate: 81 (09/25 0500) Intake/Output from previous day: 09/24 0701 - 09/25 0700 In: 1711.1 [I.V.:1611.1; IV Piggyback:100] Out: 0  Intake/Output from this shift: Total I/O In: 1711.1 [I.V.:1611.1; IV Piggyback:100] Out: 0   Labs:  Recent Labs  08/19/2015 0850 08/13/15 0250 08/14/15 0510  WBC 15.7* 30.3* 21.0*  HGB 6.4* 8.7* 7.9*  HCT 20.5* 27.4* 24.9*  PLT 52* 46* PENDING     Recent Labs  08/13/2015 0850 08/13/15 0250 08/14/15 0510  NA 141 138 136  K 2.3* 3.4* 4.2  CL 103 103 102  CO2 GLUCOSE 87 123* 95  BUN 17 19 27*  CREATININE 1.29* 1.41* 1.80*  CALCIUM 7.1* 7.5* 7.5*  MG 1.4* 1.8  --   PROT 4.4*  --   --   ALBUMIN 1.6*  --   --   AST 16  --   --   ALT 13*  --   --   ALKPHOS 175*  --   --   BILITOT 0.9  --   --    Estimated Creatinine Clearance: 39.1 mL/min (by C-G formula based on Cr of 1.8).    Recent Labs  08/13/15 2047 08/14/15 0037 08/14/15 0417  GLUCAP 97 94 80    Medical History: Past Medical History  Diagnosis Date  . Obesity   . Dyslipidemia   . Hypertension   . Coronary artery disease     s/p BMS 2010 LAD  . Dysrhythmia     ventricular tachycardia resolved after LAD stent and beta blocker  . Diabetes mellitus     with retinopathy, neuropathy and microalbuminemia  . ESRD (end stage renal disease) on dialysis     Medications:  Scheduled:  . sodium chloride   Intravenous Once  . albumin human  25 g Intravenous Once  . hydrocortisone sod succinate (SOLU-CORTEF) inj  50 mg  Intravenous Q8H  . insulin aspart  0-15 Units Subcutaneous 6 times per day  . meropenem (MERREM) IV  500 mg Intravenous Q24H  . sodium chloride  3 mL Intravenous Q12H  . sodium hypochlorite   Irrigation TID  . vancomycin  750 mg Intravenous Q T,Th,Sa-HD    Assessment: 49 yo female admitted for sepsis, found to have hypokalemia. Pharmacy consulted for electrolyte management.  9/24  K = 3.4  Plan:  Will give KCl 40 meq IV.  Will check Potassium level in AM.    9/25 AM K+ 4.2 No supplementary electrolytes ordered. BMP in AM.  McBane,Matthew S, [facility] Clinical Pharmacist 08/14/2015,6:13 AM

## 2015-08-14 NOTE — Progress Notes (Signed)
Spoke with Patients husband Jonny Ruiz on the phone.  He gave consent for Dialysis, and was verified by [facility] Dialysis RN.

## 2015-08-14 NOTE — Progress Notes (Signed)
Notified Dr. Clint Guy of hemoglobin of 6.8. 1 unit of blood ordered.

## 2015-08-15 ENCOUNTER — Inpatient Hospital Stay: Payer: 59

## 2015-08-15 DIAGNOSIS — D696 Thrombocytopenia, unspecified: Secondary | ICD-10-CM

## 2015-08-15 DIAGNOSIS — Z79899 Other long term (current) drug therapy: Secondary | ICD-10-CM

## 2015-08-15 DIAGNOSIS — D649 Anemia, unspecified: Secondary | ICD-10-CM

## 2015-08-15 DIAGNOSIS — Z992 Dependence on renal dialysis: Secondary | ICD-10-CM

## 2015-08-15 DIAGNOSIS — I120 Hypertensive chronic kidney disease with stage 5 chronic kidney disease or end stage renal disease: Secondary | ICD-10-CM

## 2015-08-15 DIAGNOSIS — Z794 Long term (current) use of insulin: Secondary | ICD-10-CM

## 2015-08-15 DIAGNOSIS — N186 End stage renal disease: Secondary | ICD-10-CM

## 2015-08-15 DIAGNOSIS — E1122 Type 2 diabetes mellitus with diabetic chronic kidney disease: Secondary | ICD-10-CM

## 2015-08-15 DIAGNOSIS — I2510 Atherosclerotic heart disease of native coronary artery without angina pectoris: Secondary | ICD-10-CM

## 2015-08-15 LAB — BLOOD GAS, ARTERIAL
ALLENS TEST (PASS/FAIL): POSITIVE — AB
Acid-Base Excess: 7.4 mmol/L — ABNORMAL HIGH (ref 0.0–3.0)
Bicarbonate: 32 mEq/L — ABNORMAL HIGH (ref 21.0–28.0)
FIO2: 0.28
O2 Saturation: 93.7 %
PATIENT TEMPERATURE: 37
PH ART: 7.47 — AB (ref 7.350–7.450)
pCO2 arterial: 44 mmHg (ref 32.0–48.0)
pO2, Arterial: 65 mmHg — ABNORMAL LOW (ref 83.0–108.0)

## 2015-08-15 LAB — IRON AND TIBC
IRON: 31 ug/dL (ref 28–170)
SATURATION RATIOS: UNDETERMINED % (ref 10.4–31.8)
TIBC: UNDETERMINED ug/dL (ref 250–450)
UIBC: UNDETERMINED ug/dL

## 2015-08-15 LAB — BASIC METABOLIC PANEL
ANION GAP: 5 (ref 5–15)
BUN: 17 mg/dL (ref 6–20)
CALCIUM: 7.8 mg/dL — AB (ref 8.9–10.3)
CHLORIDE: 104 mmol/L (ref 101–111)
CO2: 32 mmol/L (ref 22–32)
Creatinine, Ser: 1.11 mg/dL — ABNORMAL HIGH (ref 0.44–1.00)
GFR calc non Af Amer: 57 mL/min — ABNORMAL LOW (ref >60)
Glucose, Bld: 76 mg/dL (ref 65–99)
Potassium: 4 mmol/L (ref 3.5–5.1)
SODIUM: 141 mmol/L (ref 135–145)

## 2015-08-15 LAB — GLUCOSE, CAPILLARY
GLUCOSE-CAPILLARY: 73 mg/dL (ref 65–99)
GLUCOSE-CAPILLARY: 71 mg/dL (ref 65–99)
GLUCOSE-CAPILLARY: 123 mg/dL — AB (ref 65–99)
Glucose-Capillary: 72 mg/dL (ref 65–99)

## 2015-08-15 LAB — TYPE AND SCREEN
ABO/RH(D): A NEG
Antibody Screen: NEGATIVE
Unit division: 0
Unit division: 0

## 2015-08-15 LAB — PREPARE PLATELET PHERESIS: UNIT DIVISION: 0

## 2015-08-15 LAB — CBC
HCT: 27.8 % — ABNORMAL LOW (ref 35.0–47.0)
HEMOGLOBIN: 9.1 g/dL — AB (ref 12.0–16.0)
MCH: 30.1 pg (ref 26.0–34.0)
MCHC: 32.7 g/dL (ref 32.0–36.0)
MCV: 91.8 fL (ref 80.0–100.0)
PLATELETS: 21 10*3/uL — AB (ref 150–440)
RBC: 3.02 MIL/uL — AB (ref 3.80–5.20)
RDW: 17.2 % — ABNORMAL HIGH (ref 11.5–14.5)
WBC: 14.9 10*3/uL — AB (ref 3.6–11.0)

## 2015-08-15 LAB — HEPARIN INDUCED PLATELET AB (HIT ANTIBODY): Heparin Induced Plt Ab: 0.246 OD (ref 0.000–0.400)

## 2015-08-15 LAB — PARATHYROID HORMONE, INTACT (NO CA): PTH: 55 pg/mL (ref 15–65)

## 2015-08-15 LAB — FIBRIN DEGRADATION PROD.(ARMC ONLY): FIBRIN DEGRADATION PROD.: NEGATIVE ug/mL — AB (ref <10)

## 2015-08-15 LAB — FERRITIN: Ferritin: 802 ng/mL — ABNORMAL HIGH (ref 11–307)

## 2015-08-15 LAB — FIBRIN DERIVATIVES D-DIMER (ARMC ONLY): FIBRIN DERIVATIVES D-DIMER (ARMC): 1057 — AB (ref 0–499)

## 2015-08-15 LAB — FIBRINOGEN: Fibrinogen: 330 mg/dL (ref 210–470)

## 2015-08-15 LAB — HEPATITIS B SURFACE ANTIGEN: Hepatitis B Surface Ag: NEGATIVE

## 2015-08-15 LAB — PHOSPHORUS: PHOSPHORUS: 1.6 mg/dL — AB (ref 2.5–4.6)

## 2015-08-15 MED ORDER — VITAL 1.5 CAL PO LIQD
1000.0000 mL | ORAL | Status: DC
  Administered 2015-08-15 – 2015-08-18 (×3): 1000 mL

## 2015-08-15 MED ORDER — FREE WATER
100.0000 mL | Freq: Three times a day (TID) | Status: DC
  Administered 2015-08-15 – 2015-09-02 (×42): 100 mL

## 2015-08-15 MED ORDER — PRO-STAT SUGAR FREE PO LIQD
30.0000 mL | Freq: Three times a day (TID) | ORAL | Status: DC
  Administered 2015-08-15 – 2015-08-18 (×12): 30 mL

## 2015-08-15 MED ORDER — SODIUM CHLORIDE 0.9 % IV SOLN
Freq: Once | INTRAVENOUS | Status: AC
  Administered 2015-08-15: 17:00:00 via INTRAVENOUS
  Filled 2015-08-15: qty 10

## 2015-08-15 MED ORDER — PRO-STAT SUGAR FREE PO LIQD
30.0000 mL | Freq: Three times a day (TID) | ORAL | Status: DC
Start: 2015-08-15 — End: 2015-08-15

## 2015-08-15 NOTE — Progress Notes (Signed)
Subjective:  Pt still in CCU.  Not waking up or following commands at this time. VS stablw  Objective:  Vital signs in last 24 hours:  Temp:  [97.8 F (36.6 C)-99 F (37.2 C)] 98.3 F (36.8 C) (09/26 0700) Pulse Rate:  [81-92] 92 (09/26 0700) Resp:  [16-29] 24 (09/26 0700) BP: (98-135)/(22-73) 135/73 mmHg (09/26 0700) SpO2:  [92 %-100 %] 96 % (09/26 0700) Arterial Line BP: (134-156)/(64-73) 149/70 mmHg (09/25 2100) FiO2 (%):  [28 %] 28 % (09/26 0000) Weight:  [76.4 kg (168 lb 6.9 oz)-80.7 kg (177 lb 14.6 oz)] 76.4 kg (168 lb 6.9 oz) (09/26 0633)  Weight change:  Filed Weights   2015-08-30 1300 08/14/15 1140 08/15/15 0633  Weight: 74.571 kg (164 lb 6.4 oz) 80.7 kg (177 lb 14.6 oz) 76.4 kg (168 lb 6.9 oz)    Intake/Output: I/O last 3 completed shifts: In: 2443.6 [I.V.:1611.1; Blood:682.5; IV Piggyback:150] Out: 500 [Other:500]   Intake/Output this shift:     Physical Exam: General: Chronically ill appearing  Head: Normocephalic, atraumatic. Moist oral mucosal membranes  Eyes: Eyes closed  Neck: Trach in place  Lungs:  Scattered rhonchi, normal effort  Heart: S1S2 no rubs  Abdomen:  Soft, nontender, BS present  Extremities:  trace peripheral edema, b/l feet in soft support  Neurologic: Lethargic, not responsive to verbal or tactile stimulus  Skin: No lesions  Access: R IJ permcath    Basic Metabolic Panel:  Recent Labs Lab 2015-08-30 0850 08/13/15 0250 08/14/15 0510 08/14/15 1203 08/15/15 0530  NA 141 138 136  --  141  K 2.3* 3.4* 4.2  --  4.0  CL 103 103 102  --  104  CO2 --  32  GLUCOSE 87 123* 95  --  76  BUN 17 19 27*  --  17  CREATININE 1.29* 1.41* 1.80*  --  1.11*  CALCIUM 7.1* 7.5* 7.5*  --  7.8*  MG 1.4* 1.8  --   --   --   PHOS  --   --   --  1.6*  --     Liver Function Tests:  Recent Labs Lab 08/30/15 0850  AST 16  ALT 13*  ALKPHOS 175*  BILITOT 0.9  PROT 4.4*  ALBUMIN 1.6*   No results for input(s): LIPASE, AMYLASE in the  last 168 hours. No results for input(s): AMMONIA in the last 168 hours.  CBC:  Recent Labs Lab 30-Aug-2015 0850 08/13/15 0250 08/14/15 0510 08/14/15 1747 08/15/15 0530  WBC 15.7* 30.3* 21.0* 14.8* 14.9*  NEUTROABS 13.8*  --   --  14.0*  --   HGB 6.4* 8.7* 7.9* 6.8* 9.1*  HCT 20.5* 27.4* 24.9* 21.4* 27.8*  MCV 95.5 93.3 93.4 92.8 91.8  PLT 52* 46* 18* 43* 21*    Cardiac Enzymes:  Recent Labs Lab Aug 30, 2015 0850  TROPONINI 0.22*    BNP: Invalid input(s): POCBNP  CBG:  Recent Labs Lab 08/14/15 0712 08/14/15 1340 08/14/15 1609 08/14/15 2213 08/15/15 0724  GLUCAP 84 90 87 75 73    Microbiology: Results for orders placed or performed during the hospital encounter of 08/30/15  Blood Culture (routine x 2)     Status: None (Preliminary result)   Collection Time: 2015/08/30  8:51 AM  Result Value Ref Range Status   Specimen Description BLOOD [name]  Final   Special Requests BOTTLES DRAWN AEROBIC AND ANAEROBIC  3CC  Final   Culture NO GROWTH 2 DAYS  Final  Report Status PENDING  Incomplete  Blood Culture (routine x 2)     Status: None (Preliminary result)   Collection Time: 2015-08-28  9:20 AM  Result Value Ref Range Status   Specimen Description BLOOD LEFT ARM  Final   Special Requests BOTTLES DRAWN AEROBIC AND ANAEROBIC  1CC  Final   Culture NO GROWTH 2 DAYS  Final   Report Status PENDING  Incomplete  Wound culture     Status: None (Preliminary result)   Collection Time: 08-28-15  9:20 AM  Result Value Ref Range Status   Specimen Description DECUBITIS  Final   Special Requests Normal  Final   Gram Stain   Final    FEW WBC SEEN MANY GRAM NEGATIVE RODS RARE GRAM POSITIVE COCCI    Culture   Final    HEAVY GROWTH GRAM NEGATIVE RODS MODERATE GROWTH GRAM NEGATIVE RODS IDENTIFICATION AND SUSCEPTIBILITIES TO FOLLOW    Report Status PENDING  Incomplete  MRSA PCR Screening     Status: None   Collection Time: Aug 28, 2015  2:38 PM  Result Value Ref Range Status   MRSA by  PCR NEGATIVE NEGATIVE Final    Comment:        The GeneXpert MRSA Assay (FDA approved for NASAL specimens only), is one component of a comprehensive MRSA colonization surveillance program. It is not intended to diagnose MRSA infection nor to guide or monitor treatment for MRSA infections.   Blood culture (single)     Status: None (Preliminary result)   Collection Time: August 28, 2015  3:36 PM  Result Value Ref Range Status   Specimen Description BLOOD RIGHT ASSIST CONTROL  Final   Special Requests BOTTLES DRAWN AEROBIC AND ANAEROBIC  Final   Culture NO GROWTH 2 DAYS  Final   Report Status PENDING  Incomplete  Wound culture     Status: None (Preliminary result)   Collection Time: 08/13/15 12:37 PM  Result Value Ref Range Status   Specimen Description WOUND  Final   Special Requests Normal  Final   Gram Stain   Final    FEW WBC SEEN TOO NUMEROUS TO COUNT GRAM NEGATIVE RODS FEW GRAM POSITIVE COCCI    Culture   Final    HEAVY GROWTH GRAM NEGATIVE RODS IDENTIFICATION AND SUSCEPTIBILITIES TO FOLLOW ONCE ISOLATED    Report Status PENDING  Incomplete    Coagulation Studies: No results for input(s): LABPROT, INR in the last 72 hours.  Urinalysis: No results for input(s): COLORURINE, LABSPEC, PHURINE, GLUCOSEU, HGBUR, BILIRUBINUR, KETONESUR, PROTEINUR, UROBILINOGEN, NITRITE, LEUKOCYTESUR in the last 72 hours.  Invalid input(s): APPERANCEUR    Imaging: No results found.   Medications:   . norepinephrine (LEVOPHED) Adult infusion 1 mcg/min (08/13/15 1637)   . sodium chloride   Intravenous Once  . sodium chloride   Intravenous Once  . feeding supplement (PRO-STAT SUGAR FREE 64)  30 mL Oral TID WC & HS  . hydrocortisone sod succinate (SOLU-CORTEF) inj  50 mg Intravenous Q12H  . insulin aspart  0-15 Units Subcutaneous TID WC  . meropenem (MERREM) IV  500 mg Intravenous Q24H  . sodium chloride  3 mL Intravenous Q12H  . sodium hypochlorite   Irrigation TID   sodium  chloride, sodium chloride, alteplase, lidocaine (PF), lidocaine-prilocaine, pentafluoroprop-tetrafluoroeth  Assessment/ Plan:  49 y.o. 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  1. End-stage renal disease on hemodialysis on HD TTHS. The patient has been on dialysis since October of 2014.  -plan for HD tomorrow via  R IJ permcath.   2. Anemia of CKD: received blood transfusion this admission.  3. Secondary hyperparathyroidism: phos low at 1.6.  4. Septic shock: Multiple potential sources, has two catheters in place, also has a sacral decubitis, and also has trach in place.  -blood cultures thus far negative.    - ABG ordered per her husbands's request  5. Hypokalemia: resolved with repletion,    6.  Thrombocytopenia:  Platelets declining, would consider HIT panel, defer further work up to pulm/cc and hospitalist.  pack catheter with citrate  D/w patient;s husband - John   LOS: 3 Brettney Ficken 9/26/20168:37 AM

## 2015-08-15 NOTE — Progress Notes (Signed)
[facility] - [place] [facility]   PATIENT NAME: Courtney [name]    MR#:  [id number]  DATE OF BIRTH:  January 28, 1966  SUBJECTIVE:  CHIEF COMPLAINT:  Patient is lethargic. Opens her eyes to verbal commands but not communicating. Patient had transfusion yesterday  REVIEW OF SYSTEMS:  Unable to obtain review of systems DRUG ALLERGIES:   Allergies  Allergen Reactions  . Contrast Media [Iodinated Diagnostic Agents] Anaphylaxis  . Ampicillin Rash    VITALS:  Blood pressure 140/72, pulse 86, temperature 98.3 F (36.8 C), temperature source Axillary, resp. rate 24, height  (1.753 m), weight 76.4 kg (168 lb 6.9 oz), SpO2 100 %.  PHYSICAL EXAMINATION:  GENERAL:  49 y.o.-year-old patient lying in the bed with no acute distress.  EYES: Pupils equal, round, reactive to light and accommodation No scleral icterus. HEENT: Head atraumatic, normocephalic. Oropharynx and nasopharynx clear. Trach site is intact NECK:  Supple, no jugular venous distention. No thyroid enlargement, no tenderness.  LUNGS: Moderate air entry, decreased breath sounds at the bases, no wheezing, rales,rhonchi or crepitation. No use of accessory muscles of respiration.  CARDIOVASCULAR: S1, S2 normal. No murmurs, rubs, or gallops.  ABDOMEN: Soft, nondistended. Bowel sounds present.  EXTREMITIES: No pedal edema, cyanosis, or clubbing.  NEUROLOGIC: Patient is with altered mental status  PSYCHIATRIC: The patient is disoriented SKIN: Large sacral decub with his ulcer with necrotic tissue. No obvious rash   LABORATORY PANEL:   CBC  Recent Labs Lab 08/15/15 0530  WBC 14.9*  HGB 9.1*  HCT 27.8*  PLT 21*   ------------------------------------------------------------------------------------------------------------------  Chemistries   Recent Labs Lab 08/24/2015 0850 08/13/15 0250  08/15/15 0530  NA 141 138  < > 141  K 2.3* 3.4*  < > 4.0  CL 103 103  < > 104  CO2 30 28  < > 32   GLUCOSE 87 123*  < > 76  BUN 17 19  < > 17  CREATININE 1.29* 1.41*  < > 1.11*  CALCIUM 7.1* 7.5*  < > 7.8*  MG 1.4* 1.8  --   --   AST 16  --   --   --   ALT 13*  --   --   --   ALKPHOS 175*  --   --   --   BILITOT 0.9  --   --   --   < > = values in this interval not displayed. ------------------------------------------------------------------------------------------------------------------  Cardiac Enzymes  Recent Labs Lab 24-Aug-2015 0850  TROPONINI 0.22*   ------------------------------------------------------------------------------------------------------------------  RADIOLOGY:  Dg Abd 1 View  08/15/2015   CLINICAL DATA:  OG tube placement  EXAM: ABDOMEN - 1 VIEW  COMPARISON:  06/15/2015  FINDINGS: OG tube tip is in the distal stomach. Nonobstructive bowel gas pattern. Linear high density areas throughout the abdomen, likely peritoneal calcifications. No visible free air.  IMPRESSION: OG tube tip in the distal stomach.   Electronically Signed   By: Charlett Nose M.D.   On: 08/15/2015 10:12    EKG:   Orders placed or performed in visit on 08/24/15  . EKG 12-Lead  . EKG 12-Lead    ASSESSMENT AND PLAN:   1. Septic shock. Probably from stage IV foul-smelling sacral decubiti, left lower lobe infiltrate versus atelectasis Patient is started on meropenem and DC'd vancomycin and cefepime and Levaquin. Follow-up  blood Culture with no growth in 3 days. Wound culture with gram-negative rods and few gram-positive cocci off  levophed as blood pressure  is better. Hold all antihypertensives medication Appreciate Critical care recommendations.   2. Acute encephalopathy- likely from problem #1/? Seizures Initial CT scan was negative.  Patient has no history of seizure.  Status post EEG  Appreciate neurology recommendations  3. symptomatic anemia-   status post1 unit of blood transfusion and hemoglobin is at 8.4 today. Will repeat CBC in a.m 4. Thrombocytopenia- no acute bleeding.  Platelet count is at 21,000 today.Will hold off on heparin subcutaneous at this point Transfuse platelets if platelet count is less than or equal to 10,000 HIT/DIC panel is ordered Oncology recommendations are appreciated Will stop using heparin during dialysis, discussed with nephrology they would rather use citrate instead of heparin  5. End-stage renal disease on hemodialysis- patient had hemodialysis yesterday and planning for repeat dialysis in a.m.  6. Oxygenate with trach collar at this point 7. Elevated troponin likely demand ischemia with septic shock 8. Stage IV sacral decubitus- status post bedside wound debridement. Appreciate surgery recommendations. Patient needs wound VAC eventually. Follow-up on the cultures obtained from the ED and obtained from yesterday during debridement 9. Hypokalemia- replace potassium IV. Check magnesium 10. Nutrition- we will consider diet when the patient is more awake and alert    All the records are reviewed and case discussed with Care Management/Social Workerr. Family members at bedside will call husband Mr. [name]  CODE STATUS: Full code  TOTAL CRITICAL CARE TIME TAKING CARE OF THIS PATIENT: 35 minutes.   POSSIBLE D/C IN ? DAYS, DEPENDING ON CLINICAL CONDITION.   Ramonita Lab M.D on 08/15/2015 at 4:27 PM  Between 7am to 6pm - Pager - [phone] After 6pm go to [email or web address] - password [email or web address] [facility]  [facility]  Office  [phone]  CC: Primary care physician; [name], MD

## 2015-08-15 NOTE — Consult Note (Signed)
ANTIBIOTIC CONSULT NOTE - INITIAL  Pharmacy Consult for aztreonam and meropenem Indication: sepsis  Allergies  Allergen Reactions  . Contrast Media [Iodinated Diagnostic Agents] Anaphylaxis  . Ampicillin Rash    Patient Measurements: Height:  (175.3 cm) Weight: 168 lb 6.9 oz (76.4 kg) IBW/kg (Calculated) : 66.2 Adjusted Body Weight:   Vital Signs: Temp: 98.3 F (36.8 C) (09/26 0700) Temp Source: Axillary (09/26 0400) BP: 135/73 mmHg (09/26 0700) Pulse Rate: 92 (09/26 0700) Intake/Output from previous day: 09/25 0701 - 09/26 0700 In: 732.5 [Blood:682.5; IV Piggyback:50] Out: 500  Intake/Output from this shift:    Labs:  Recent Labs  08/13/15 0250 08/14/15 0510 08/14/15 1747 08/15/15 0530  WBC 30.3* 21.0* 14.8* 14.9*  HGB 8.7* 7.9* 6.8* 9.1*  PLT 46* 18* 43* 21*  CREATININE 1.41* 1.80*  --  1.11*   Estimated Creatinine Clearance: 63.4 mL/min (by C-G formula based on Cr of 1.11). No results for input(s): VANCOTROUGH, VANCOPEAK, VANCORANDOM, GENTTROUGH, GENTPEAK, GENTRANDOM, TOBRATROUGH, TOBRAPEAK, TOBRARND, AMIKACINPEAK, AMIKACINTROU, AMIKACIN in the last 72 hours.   Microbiology: Recent Results (from the past 720 hour(s))  Culture, blood (routine x 2)     Status: None   Collection Time: 08/01/15  7:00 PM  Result Value Ref Range Status   Specimen Description BLOOD RIGHT ANTECUBITAL  Final   Special Requests BOTTLES DRAWN AEROBIC ONLY 5CC  Final   Culture NO GROWTH 5 DAYS  Final   Report Status 08/06/2015 FINAL  Final  Culture, blood (routine x 2)     Status: None   Collection Time: 08/01/15  7:10 PM  Result Value Ref Range Status   Specimen Description BLOOD RIGHT HAND  Final   Special Requests IN PEDIATRIC BOTTLE 3CC  Final   Culture  Setup Time   Final    GRAM POSITIVE COCCI IN CLUSTERS AEROBIC BOTTLE ONLY CRITICAL RESULT CALLED TO, READ BACK BY AND VERIFIED WITH: D TAYLOR,RN AT 1115 08/02/15 BY L BENFIELD    Culture STAPHYLOCOCCUS SPECIES  (COAGULASE NEGATIVE)  Final   Report Status 08/06/2015 FINAL  Final   Organism ID, Bacteria STAPHYLOCOCCUS SPECIES (COAGULASE NEGATIVE)  Final      Susceptibility   Staphylococcus species (coagulase negative) - MIC*    CIPROFLOXACIN >=8 RESISTANT Resistant     ERYTHROMYCIN <=0.25 SENSITIVE Sensitive     GENTAMICIN 8 INTERMEDIATE Intermediate     OXACILLIN >=4 RESISTANT Resistant     TETRACYCLINE <=1 SENSITIVE Sensitive     VANCOMYCIN 1 SENSITIVE Sensitive     TRIMETH/SULFA 160 RESISTANT Resistant     CLINDAMYCIN <=0.25 SENSITIVE Sensitive     RIFAMPIN <=0.5 SENSITIVE Sensitive     Inducible Clindamycin NEGATIVE Sensitive     * STAPHYLOCOCCUS SPECIES (COAGULASE NEGATIVE)  Culture, blood (routine x 2)     Status: None   Collection Time: 08/02/15  7:00 PM  Result Value Ref Range Status   Specimen Description BLOOD RIGHT ANTECUBITAL  Final   Special Requests BOTTLES DRAWN AEROBIC ONLY 10CC  Final   Culture  Setup Time   Final    GRAM POSITIVE COCCI IN CLUSTERS AEROBIC BOTTLE ONLY CRITICAL RESULT CALLED TO, READ BACK BY AND VERIFIED WITH: DR Gwenevere Abbot RN 1549 08/03/15 A BROWNING    Culture   Final    STAPHYLOCOCCUS SPECIES (COAGULASE NEGATIVE) SUSCEPTIBILITIES PERFORMED ON PREVIOUS CULTURE WITHIN THE LAST 5 DAYS.    Report Status 08/06/2015 FINAL  Final  Culture, blood (routine x 2)     Status: None   Collection Time:  08/02/15  7:05 PM  Result Value Ref Range Status   Specimen Description BLOOD RIGHT HAND  Final   Special Requests BOTTLES DRAWN AEROBIC AND ANAEROBIC 10CC  Final   Culture NO GROWTH 5 DAYS  Final   Report Status 08/07/2015 FINAL  Final  Blood Culture (routine x 2)     Status: None (Preliminary result)   Collection Time: 07/27/2015  8:51 AM  Result Value Ref Range Status   Specimen Description BLOOD UNKO  Final   Special Requests BOTTLES DRAWN AEROBIC AND ANAEROBIC  3CC  Final   Culture NO GROWTH 3 DAYS  Final   Report Status PENDING  Incomplete  Blood Culture (routine  x 2)     Status: None (Preliminary result)   Collection Time: 08/07/2015  9:20 AM  Result Value Ref Range Status   Specimen Description BLOOD LEFT ARM  Final   Special Requests BOTTLES DRAWN AEROBIC AND ANAEROBIC  1CC  Final   Culture NO GROWTH 3 DAYS  Final   Report Status PENDING  Incomplete  Wound culture     Status: None (Preliminary result)   Collection Time: 07/26/2015  9:20 AM  Result Value Ref Range Status   Specimen Description DECUBITIS  Final   Special Requests Normal  Final   Gram Stain   Final    FEW WBC SEEN MANY GRAM NEGATIVE RODS RARE GRAM POSITIVE COCCI    Culture   Final    HEAVY GROWTH GRAM NEGATIVE RODS MODERATE GROWTH GRAM NEGATIVE RODS IDENTIFICATION AND SUSCEPTIBILITIES TO FOLLOW    Report Status PENDING  Incomplete  MRSA PCR Screening     Status: None   Collection Time: 08/11/2015  2:38 PM  Result Value Ref Range Status   MRSA by PCR NEGATIVE NEGATIVE Final    Comment:        The GeneXpert MRSA Assay (FDA approved for NASAL specimens only), is one component of a comprehensive MRSA colonization surveillance program. It is not intended to diagnose MRSA infection nor to guide or monitor treatment for MRSA infections.   Blood culture (single)     Status: None (Preliminary result)   Collection Time: 07/24/2015  3:36 PM  Result Value Ref Range Status   Specimen Description BLOOD RIGHT ASSIST CONTROL  Final   Special Requests BOTTLES DRAWN AEROBIC AND ANAEROBIC  Final   Culture NO GROWTH 3 DAYS  Final   Report Status PENDING  Incomplete  Wound culture     Status: None (Preliminary result)   Collection Time: 08/13/15 12:37 PM  Result Value Ref Range Status   Specimen Description WOUND  Final   Special Requests Normal  Final   Gram Stain   Final    FEW WBC SEEN TOO NUMEROUS TO COUNT GRAM NEGATIVE RODS FEW GRAM POSITIVE COCCI    Culture   Final    HEAVY GROWTH GRAM NEGATIVE RODS IDENTIFICATION AND SUSCEPTIBILITIES TO FOLLOW ONCE ISOLATED    Report  Status PENDING  Incomplete    Medical History: Past Medical History  Diagnosis Date  . Obesity   . Dyslipidemia   . Hypertension   . Coronary artery disease     s/p BMS 2010 LAD  . Dysrhythmia     ventricular tachycardia resolved after LAD stent and beta blocker  . Diabetes mellitus     with retinopathy, neuropathy and microalbuminemia  . ESRD (end stage renal disease) on dialysis     Medications:  Scheduled:  . sodium chloride   Intravenous Once  .  sodium chloride   Intravenous Once  . feeding supplement (PRO-STAT SUGAR FREE 64)  30 mL Oral TID WC & HS  . hydrocortisone sod succinate (SOLU-CORTEF) inj  50 mg Intravenous Q12H  . insulin aspart  0-15 Units Subcutaneous TID WC  . meropenem (MERREM) IV  500 mg Intravenous Q24H  . sodium chloride  3 mL Intravenous Q12H  . sodium hypochlorite   Irrigation TID   Assessment: Pt is a 49 year old female on HD TTSat and sepsis. Pharmacy consulted to dose meropenem.  Goal of Therapy:  resolution of infection  Plan:  Will continue meropenem 500 mg iv q 24 hours.   Luisa Hart D 08/15/2015,10:02 AM

## 2015-08-15 NOTE — Progress Notes (Signed)
Husband called upset Dr Ardyth Man notified. Husband has been waiting for phone call

## 2015-08-15 NOTE — Progress Notes (Signed)
Critical platelet count of 21 reported to Dr. Sheryle Hail.

## 2015-08-15 NOTE — Consult Note (Signed)
[facility]  [phone] [phone]  ID: Courtney [name] OB: 03/28/1966  MR#: [id number]  QIO#:[id number]  Patient Care Team: Deatra James, MD as PCP - General (Family Medicine)  CHIEF COMPLAINT:  Chief Complaint  Patient presents with  . unresponsive     INTERVAL HISTORY: Patient is a 49 year old female who was recently admitted to the hospital with hypotension and altered mental status thought secondary to septic shock. She initially was on pressors, but these have been discontinued.  Patient continues to be lethargic and difficult to awake. Patient was noted to have a declining platelet count throughout her admission. Platelets were within normal limits as recently as August 03, 2015.  There are no family members at bedside.  REVIEW OF SYSTEMS:   Review of Systems  Unable to perform ROS: critical illness    As per HPI. Otherwise, a complete review of systems is negatve.  PAST MEDICAL HISTORY: Past Medical History  Diagnosis Date  . Obesity   . Dyslipidemia   . Hypertension   . Coronary artery disease     s/p BMS 2010 LAD  . Dysrhythmia     ventricular tachycardia resolved after LAD stent and beta blocker  . Diabetes mellitus     with retinopathy, neuropathy and microalbuminemia  . ESRD (end stage renal disease) on dialysis     PAST SURGICAL HISTORY: Past Surgical History  Procedure Laterality Date  . Cholecystectomy  1990  . Pci lad  12/2010  . Colonoscopy  08/2011  . Left heart catheterization with coronary angiogram N/A 01/28/2012    Procedure: LEFT HEART CATHETERIZATION WITH CORONARY ANGIOGRAM;  Surgeon: Lesleigh Noe, MD;  Location: [facility] CATH LAB;  Service: Cardiovascular;  Laterality: N/A;  . Tracheostomy    . Gastric bypass      FAMILY HISTORY Family History  Problem Relation Age of Onset  . Hypertension Father   . Diabetes Father   . Cancer Mother   . Diabetes Paternal Grandfather   . Hypertension  Paternal Grandfather   . Diabetes Paternal Grandmother   . Hypertension Paternal Grandmother   . Cancer Maternal Grandmother     colon ca       ADVANCED DIRECTIVES:    HEALTH MAINTENANCE: Social History  Substance Use Topics  . Smoking status: Never Smoker   . Smokeless tobacco: Never Used  . Alcohol Use: No     Comment: occassional     Colonoscopy:  PAP:  Bone density:  Lipid panel:  Allergies  Allergen Reactions  . Contrast Media [Iodinated Diagnostic Agents] Anaphylaxis  . Ampicillin Rash    Current Facility-Administered Medications  Medication Dose Route Frequency Provider Last Rate Last Dose  . 0.9 %  sodium chloride infusion   Intravenous Once Alford Highland, MD   Stopped at Aug 26, 2015 1452  . 0.9 %  sodium chloride infusion  100 mL Intravenous PRN Munsoor Lateef, MD      . 0.9 %  sodium chloride infusion  100 mL Intravenous PRN Munsoor Lateef, MD      . 0.9 %  sodium chloride infusion   Intravenous Once Aruna Gouru, MD      . alteplase (CATHFLO ACTIVASE) injection 2 mg  2 mg Intracatheter Once PRN Munsoor Lateef, MD      . feeding supplement (PRO-STAT SUGAR FREE 64) liquid 30 mL  30 mL Per Tube TID Shane Crutch, MD      . feeding supplement (VITAL 1.5 CAL) liquid 1,000 mL  1,000 mL  Per Tube Continuous Shane Crutch, MD      . free water 100 mL  100 mL Per Tube 3 times per day Shane Crutch, MD      . hydrocortisone sodium succinate (SOLU-CORTEF) 100 MG injection 50 mg  50 mg Intravenous Q12H Merwyn Katos, MD   50 mg at 08/15/15 1108  . insulin aspart (novoLOG) injection 0-15 Units  0-15 Units Subcutaneous TID WC Merwyn Katos, MD   0 Units at 08/14/15 1401  . lidocaine (PF) (XYLOCAINE) 1 % injection 5 mL  5 mL Intradermal PRN Munsoor Lateef, MD      . lidocaine-prilocaine (EMLA) cream 1 application  1 application Topical PRN Munsoor Lateef, MD      . meropenem (MERREM) 500 mg in sodium chloride 0.9 % 50 mL IVPB  500 mg Intravenous Q24H  Ramonita Lab, MD   500 mg at 08/14/15 1949  . norepinephrine (LEVOPHED) 16 mg in dextrose 5 % 250 mL (0.064 mg/mL) infusion  0-40 mcg/min Intravenous Titrated Merwyn Katos, MD 0.9 mL/hr at 08/13/15 1637 1 mcg/min at 08/13/15 1637  . pentafluoroprop-tetrafluoroeth (GEBAUERS) aerosol 1 application  1 application Topical PRN Munsoor Lateef, MD      . sodium chloride 0.9 % injection 3 mL  3 mL Intravenous Q12H Alford Highland, MD   3 mL at 08/14/15 2250  . sodium hypochlorite (DAKIN'S 1/4 STRENGTH) topical solution   Irrigation TID Ida Rogue, MD        OBJECTIVE: Filed Vitals:   08/15/15 1200  BP: 134/60  Pulse: 89  Temp:   Resp: 23     Body mass index is 24.86 kg/(m^2).    ECOG FS:4 - Bedbound  General: Critically ill-appearing, no acute distress. HEENT: Trach tube in place. Lungs: Clear to auscultation bilaterally. Heart: Regular rate and rhythm. No rubs, murmurs, or gallops. Abdomen: Soft, nontender, nondistended. No organomegaly noted, normoactive bowel sounds. Musculoskeletal: No edema, cyanosis, or clubbing. Neuro: Lethargic, difficult to arouse. Skin: No rashes or petechiae noted. Psych: Lethargic.  LAB RESULTS:  Lab Results  Component Value Date   NA 141 08/15/2015   K 4.0 08/15/2015   CL 104 08/15/2015   CO2 32 08/15/2015   GLUCOSE 76 08/15/2015   BUN 17 08/15/2015   CREATININE 1.11* 08/15/2015   CALCIUM 7.8* 08/15/2015   PROT 4.4* September 08, 2015   ALBUMIN 1.6* 09-08-2015   AST 16 2015-09-08   ALT 13* 08-Sep-2015   ALKPHOS 175* 2015/09/08   BILITOT 0.9 2015-09-08   GFRNONAA 57* 08/15/2015   GFRAA >60 08/15/2015    Lab Results  Component Value Date   WBC 14.9* 08/15/2015   NEUTROABS 14.0* 08/14/2015   HGB 9.1* 08/15/2015   HCT 27.8* 08/15/2015   MCV 91.8 08/15/2015   PLT 21* 08/15/2015     STUDIES: Dg Chest 1 View  09-08-15   CLINICAL DATA:  Hypotension. Unresponsive patient with tracheostomy in place.  EXAM: CHEST 1 VIEW  COMPARISON:   08/02/2015  FINDINGS: Cardiac silhouette is top-normal in size. No mediastinal or hilar masses or evidence of adenopathy. Lungs are clear. No pleural effusion or pneumothorax.  Right internal jugular dual-lumen central venous catheter is stable with its distal tip in the right atrium. Left internal jugular small caliber dual-lumen central venous catheter is stable with its tip in the mid superior vena cava.  Tracheostomy tube is well positioned and also stable.  IMPRESSION: 1. Tracheostomy tube and central venous lines are stable as detailed above. 2. No acute cardiopulmonary disease.  Electronically Signed   By: Amie Portland M.D.   On: 2015/08/20 09:24   Dg Abd 1 View  08/15/2015   CLINICAL DATA:  OG tube placement  EXAM: ABDOMEN - 1 VIEW  COMPARISON:  06/15/2015  FINDINGS: OG tube tip is in the distal stomach. Nonobstructive bowel gas pattern. Linear high density areas throughout the abdomen, likely peritoneal calcifications. No visible free air.  IMPRESSION: OG tube tip in the distal stomach.   Electronically Signed   By: Charlett Nose M.D.   On: 08/15/2015 10:12   Ct Head Wo Contrast  08/20/15   CLINICAL DATA:  Unresponsive patient.  Leftward gaze.  EXAM: CT HEAD WITHOUT CONTRAST  TECHNIQUE: Contiguous axial images were obtained from the base of the skull through the vertex without intravenous contrast.  COMPARISON:  MRI 01/28/2012.  FINDINGS: No mass lesion, mass effect, midline shift, hydrocephalus, hemorrhage. No territorial ischemia or acute infarction. Unchanged small dilated perivascular spaces are noted in the basal ganglia.  IMPRESSION: Negative CT head.   Electronically Signed   By: Andreas Newport M.D.   On: 20-Aug-2015 10:11   Dg Chest Port 1 View  08/13/2015   CLINICAL DATA:  Respiratory failure  EXAM: PORTABLE CHEST 1 VIEW  COMPARISON:  2015-08-20  FINDINGS: Support devices are unchanged. Cardiomegaly with vascular congestion. Left lower lobe atelectasis or infiltrate has increased.  Suspect small left effusion. No confluent opacity on the right.  IMPRESSION: Increasing left lower lobe atelectasis or infiltrate with small left effusion.   Electronically Signed   By: Charlett Nose M.D.   On: 08/13/2015 09:31   Dg Chest Port 1 View  08/02/2015   CLINICAL DATA:  Respiratory failure.  EXAM: PORTABLE CHEST - 1 VIEW  COMPARISON:  07/17/2015.  FINDINGS: Tracheostomy tube, left IJ line, dual-lumen right IJ line in stable position. Mediastinum and hilar structures are stable. Low lung volumes with mild bibasilar atelectasis. Stable cardiomegaly. No pulmonary venous congestion. No pleural effusion or pneumothorax. Abdominal calcifications are again noted. No acute bony abnormality.  IMPRESSION: 1. Lines and tubes in stable position. 2. Low lung volumes with mild bibasilar subsegmental atelectasis. 3. Stable cardiomegaly.  No pulmonary venous congestion.   Electronically Signed   By: Maisie Fus  Register   On: 08/02/2015 08:01   Dg Chest Port 1 View  07/17/2015   CLINICAL DATA:  Followup of pneumonia.  EXAM: PORTABLE CHEST - 1 VIEW  COMPARISON:  06/30/2015  FINDINGS: Left-sided internal jugular line terminates at the high SVC. Dialysis catheter from right-sided approach is unchanged with type at right atrium. Removal of nasogastric tube. Tracheostomy. Normal heart size. No pleural effusion or pneumothorax. Clear lungs.  IMPRESSION: No acute cardiopulmonary disease.   Electronically Signed   By: Jeronimo Greaves M.D.   On: 07/17/2015 09:51    ASSESSMENT: Thrombocytopenia in critically ill patient.  PLAN:    1. Thrombocytopenia: Despite patient's multiple medical problems her platelet count was within normal limits as recently as August 03, 2015. Her current decline is likely due to her critical illness or possibly medication induced. HITT labs have been ordered and are currently pending. Will also complete workup to assess for DIC. Continue to monitor daily platelet count. Transfuse if platelets falls  below 10,000 or there is evidence of bleeding. 2. Anemia: Patient likely receiving Procrit with dialysis. Will order iron stores for completeness.  Appreciate consult, will follow.  Jeralyn Ruths, MD   08/15/2015 1:28 PM

## 2015-08-15 NOTE — Progress Notes (Signed)
Nutrition Follow-up   INTERVENTION:   EN: recommend starting Vital 1.5 via dobhoff with goal rate of 50 ml/h with Prostat TID providing 2100 kacls, 127 g of protein, 912 mL of free water. Continue to assess   NUTRITION DIAGNOSIS:   Inadequate oral intake related to wound healing, acute illness, chronic illness as evidenced by NPO status, estimated needs. Being addressed via TF  GOAL:   Patient will meet greater than or equal to 90% of their needs  MONITOR:    (Energy Intake, Anthropometrics, Digestive System, Electrolyte/Renal Profile, Glucose Profile)  REASON FOR ASSESSMENT:   Consult Enteral/tube feeding initiation and management  ASSESSMENT:    Pt remains lethargic, on trach collar, plan for dialysis tomorrow, off vasopressors, dobhoff tube inserted this AM  Diet Order:   unable to take po, diet order discontinue with dobhoff placement  Electrolyte and Renal Profile: repeat phosphorus pending  Recent Labs Lab 2015/08/31 0850 08/13/15 0250 08/14/15 0510 08/14/15 1203 08/15/15 0530  BUN 17 19 27*  --  17  CREATININE 1.29* 1.41* 1.80*  --  1.11*  NA 141 138 136  --  141  K 2.3* 3.4* 4.2  --  4.0  MG 1.4* 1.8  --   --   --   PHOS  --   --   --  1.6*  --    Glucose Profile:  Recent Labs  08/14/15 1609 08/14/15 2213 08/15/15 0724  GLUCAP 87 75 73   Meds: ss novolog  Skin:   (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  Estimated Nutritional Needs:   Kcal:  2130-8657 kcals (BEE 1434, 1.2 AF, 1.2-1.4 IF)   Protein:  113-150 g (1.5-2.0 g/kg)   Fluid:  1000 mL plus UOP  HIGH Care Level  Romelle Starcher MS, RD, LDN [phone] Pager

## 2015-08-15 NOTE — Progress Notes (Signed)
PULMONARY / CRITICAL CARE MEDICINE   Name: Atara Paterson MRN: [id number] DOB: February 12, 1966    ADMISSION DATE:  08/11/2015 CONSULTATION DATE: 9/23  INITIAL PRESENTATION:  32 F who has been in medical facilities (hosp, LTAC, rehab) for 2 yrs following gastric bypass surgery with multiple complications. Now with chronic trach, ESRD, profound debilitation, severe sacral pressure ulcer. Was seemingly making progress and transferred to rehab facility approx one week prior to this admission. She was sent to [facility] ED with AMS and hypotension. Working dx of severe sepsis/septic shock due to infected sacral pressure ulcer. Also has R side externalized HD cath and tunneled L IJ CVL. She has history of resistant bacterial infections. She is on chronic steroids. Her husband indicates that she has been diagnosed with adrenal insuff at some point during the past 2 yrs  MAJOR EVENTS/TEST RESULTS: 9/23 CT head: NAD 9/23 EEG: no epileptiform activity 9/23 PRBCs for Hgb 6.4 9/24 bedside debridement of sacral wound. Abscess drained 9/25 Off vasopressors. More alert. No distress. Worsening thrombocytopenia. Vanc DC'd  INDWELLING DEVICES:: Trach (chronic)  Tunneled R IJ HD cath (chronic) Tunneled L IJ CVL (chronic) L femoral A-line 9/23 >> 9/25  MICRO DATA: MRSA PCR 9/23 >> NEG Urine 9/23 >>  Wound (swab) 9/23 >> many GNR, rare GPC >>  Blood 9/23 >>  Wound (debridement) 9/24 >> heavy GNR >>    ANTIMICROBIALS:  Aztreonam 9/23 >> 9/24 Vanc 9/23 >> 9/25 Meropenem 9/23 >>    SUBJECTIVE:  RASS 0, -1. + F/C  VITAL SIGNS: Temp:  [97.8 F (36.6 C)-98.8 F (37.1 C)] 98.3 F (36.8 C) (09/26 0700) Pulse Rate:  [81-92] 92 (09/26 0700) Resp:  [16-29] 24 (09/26 0700) BP: (98-135)/(22-73) 135/73 mmHg (09/26 0700) SpO2:  [92 %-99 %] 96 % (09/26 0700) Arterial Line BP: (134-149)/(64-70) 149/70 mmHg (09/25 2100) FiO2 (%):  [28 %] 28 % (09/26 0000) Weight:  [76.4 kg (168 lb 6.9 oz)-80.7 kg (177 lb  14.6 oz)] 76.4 kg (168 lb 6.9 oz) (09/26 4098) HEMODYNAMICS:   VENTILATOR SETTINGS: Vent Mode:  [-]  FiO2 (%):  [28 %] 28 % INTAKE / OUTPUT:  Intake/Output Summary (Last 24 hours) at 08/15/15 0920 Last data filed at 08/15/15 0245  Gross per 24 hour  Intake  732.5 ml  Output    500 ml  Net  232.5 ml    PHYSICAL EXAMINATION: General: RASS 0, -1, + F/C, no respiratory distress Neuro: CNs intact, MAEs, DTRs symmetric HEENT: Cushingoid facies Neck: no JVD noted, trach site clean Cardiovascular: Regular, no M noted Lungs: No adventitious sounds Abdomen: Obese, soft, NT, diminished BS Ext: cool, diminished distal pulses, no edema Skin: very deep, malodorous sacral pressure ulcer  LABS:  CBC  Recent Labs Lab 08/14/15 0510 08/14/15 1747 08/15/15 0530  WBC 21.0* 14.8* 14.9*  HGB 7.9* 6.8* 9.1*  HCT 24.9* 21.4* 27.8*  PLT 18* 43* 21*   Coag's No results for input(s): APTT, INR in the last 168 hours. BMET  Recent Labs Lab 08/13/15 0250 08/14/15 0510 08/15/15 0530  NA 138 136 141  K 3.4* 4.2 4.0  CL 103 102 104  CO2 28 28 32  BUN 19 27* 17  CREATININE 1.41* 1.80* 1.11*  GLUCOSE 123* 95 76   Electrolytes  Recent Labs Lab 08/11/2015 0850 08/13/15 0250 08/14/15 0510 08/14/15 1203 08/15/15 0530  CALCIUM 7.1* 7.5* 7.5*  --  7.8*  MG 1.4* 1.8  --   --   --   PHOS  --   --   --  1.6*  --    Sepsis Markers  Recent Labs Lab Aug 31, 2015 0853 2015-08-31 1605  LATICACIDVEN 1.2 1.5   ABG  Recent Labs Lab Aug 31, 2015 1200 31-Aug-2015 1721 08/15/15 0850  PHART 7.50* 7.48* 7.47*  PCO2ART 42 42 44  PO2ART 157* 121* 65*   Liver Enzymes  Recent Labs Lab 08/31/15 0850  AST 16  ALT 13*  ALKPHOS 175*  BILITOT 0.9  ALBUMIN 1.6*   Cardiac Enzymes  Recent Labs Lab 2015-08-31 0850  TROPONINI 0.22*   Glucose  Recent Labs Lab 08/14/15 0417 08/14/15 0712 08/14/15 1340 08/14/15 1609 08/14/15 2213 08/15/15 0724  GLUCAP 80 84 90 87 75 73    CXR:  NNF    ASSESSMENT / PLAN:  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 HD 9/25  GASTROINTESTINAL A:  No issues P:   SUP: N/I unless requires vent support Begin D III diet with supervision 9/25  HEMATOLOGIC A:   Acute on chronic anemia - unclear if acute blood loss Thrombocytopenia, worsening. Etiology unclear. Was on heparin PTA P:  DVT px: SCDs Monitor CBC intermittently Transfuse per usual guidelines Check HIT panel 9/25 Might need platelet transfusion prior to next debridement  INFECTIOUS A:   Severe sepsis Sacral decubitus ulcer with abscess P:   Monitor temp, WBC count Micro and abx as above Surgery following  ENDOCRINE A:  DM 2, controlled Chronic prednisone therapy - chronic dose 10 mg BID Secondary adrenal insuff P:   CBGs and SSI - mod scale Stress dose steroids - wean as able based on BP  NEUROLOGIC A:   Acute encephalopathy, resolving P:   RASS goal: 0 Minimize sedating meds   FAMILY:    Wells Guiles, MD PCCM service   08/15/2015, 9:20 AM

## 2015-08-15 NOTE — Progress Notes (Signed)
Visited with patient earlier today but she is noncommunicative Sacral decubitus not examined at this point. We'll see an a.m. Poor prognosis

## 2015-08-15 NOTE — Progress Notes (Signed)
ELECTROLYTE MANAGEMENT - INITIAL  Pharmacy Consult for Electrolyte Management Indication: hypokalemia  Allergies  Allergen Reactions  . Contrast Media [Iodinated Diagnostic Agents] Anaphylaxis  . Ampicillin Rash    Patient Measurements: Height:  (175.3 cm) Weight: 168 lb 6.9 oz (76.4 kg) IBW/kg (Calculated) : 66.2   Vital Signs: Temp: 98 F (36.7 C) (09/26 0400) Temp Source: Axillary (09/26 0400) BP: 135/57 mmHg (09/26 0100) Pulse Rate: 89 (09/26 0100) Intake/Output from previous day: 09/25 0701 - 09/26 0700 In: 732.5 [Blood:682.5; IV Piggyback:50] Out: 500  Intake/Output from this shift: Total I/O In: 470 [Blood:420; IV Piggyback:50] Out: -   Labs:  Recent Labs  08/14/15 0510 08/14/15 1747 08/15/15 0530  WBC 21.0* 14.8* 14.9*  HGB 7.9* 6.8* 9.1*  HCT 24.9* 21.4* 27.8*  PLT 18* 43* 21*     Recent Labs  2015/09/03 0850 08/13/15 0250 08/14/15 0510 08/14/15 1203 08/15/15 0530  NA 141 138 136  --  141  K 2.3* 3.4* 4.2  --  4.0  CL 103 103 102  --  104  CO2 --  32  GLUCOSE 87 123* 95  --  76  BUN 17 19 27*  --  17  CREATININE 1.29* 1.41* 1.80*  --  1.11*  CALCIUM 7.1* 7.5* 7.5*  --  7.8*  MG 1.4* 1.8  --   --   --   PHOS  --   --   --  1.6*  --   PROT 4.4*  --   --   --   --   ALBUMIN 1.6*  --   --   --   --   AST 16  --   --   --   --   ALT 13*  --   --   --   --   ALKPHOS 175*  --   --   --   --   BILITOT 0.9  --   --   --   --    Estimated Creatinine Clearance: 63.4 mL/min (by C-G formula based on Cr of 1.11).    Recent Labs  08/14/15 1340 08/14/15 1609 08/14/15 2213  GLUCAP 90 87 75    Medical History: Past Medical History  Diagnosis Date  . Obesity   . Dyslipidemia   . Hypertension   . Coronary artery disease     s/p BMS 2010 LAD  . Dysrhythmia     ventricular tachycardia resolved after LAD stent and beta blocker  . Diabetes mellitus     with retinopathy, neuropathy and microalbuminemia  . ESRD (end stage renal  disease) on dialysis     Medications:  Scheduled:  . sodium chloride   Intravenous Once  . sodium chloride   Intravenous Once  . hydrocortisone sod succinate (SOLU-CORTEF) inj  50 mg Intravenous Q12H  . insulin aspart  0-15 Units Subcutaneous TID WC  . meropenem (MERREM) IV  500 mg Intravenous Q24H  . sodium chloride  3 mL Intravenous Q12H  . sodium hypochlorite   Irrigation TID    Assessment: 49 yo female admitted for sepsis, found to have hypokalemia. Pharmacy consulted for electrolyte management.  9/24  K = 3.4  Plan:  Will give KCl 40 meq IV.  Will check Potassium level in AM.    9/25 AM K+ 4.2 No supplementary electrolytes ordered. BMP in AM.  9/26. Electrolytes WNL. BMP in AM.  McBane,Matthew S, RPH Clinical Pharmacist 08/15/2015,6:51 AM

## 2015-08-16 DIAGNOSIS — J9611 Chronic respiratory failure with hypoxia: Secondary | ICD-10-CM

## 2015-08-16 LAB — APTT: aPTT: 82 seconds — ABNORMAL HIGH (ref 24–36)

## 2015-08-16 LAB — GLUCOSE, CAPILLARY
GLUCOSE-CAPILLARY: 249 mg/dL — AB (ref 65–99)
GLUCOSE-CAPILLARY: 121 mg/dL — AB (ref 65–99)
GLUCOSE-CAPILLARY: 108 mg/dL — AB (ref 65–99)
Glucose-Capillary: 135 mg/dL — ABNORMAL HIGH (ref 65–99)

## 2015-08-16 LAB — WOUND CULTURE: SPECIAL REQUESTS: NORMAL

## 2015-08-16 LAB — BLOOD GAS, ARTERIAL
Acid-Base Excess: 6.6 mmol/L — ABNORMAL HIGH (ref 0.0–3.0)
Allens test (pass/fail): POSITIVE — AB
BICARBONATE: 31.3 meq/L — AB (ref 21.0–28.0)
FIO2: 0.28
O2 Saturation: 97.6 %
PCO2 ART: 44 mmHg (ref 32.0–48.0)
PH ART: 7.46 — AB (ref 7.350–7.450)
PO2 ART: 92 mmHg (ref 83.0–108.0)
Patient temperature: 37

## 2015-08-16 LAB — BASIC METABOLIC PANEL
Anion gap: 6 (ref 5–15)
BUN: 25 mg/dL — AB (ref 6–20)
CALCIUM: 7.6 mg/dL — AB (ref 8.9–10.3)
CHLORIDE: 105 mmol/L (ref 101–111)
CO2: 31 mmol/L (ref 22–32)
CREATININE: 1.45 mg/dL — AB (ref 0.44–1.00)
GFR calc Af Amer: 48 mL/min — ABNORMAL LOW (ref >60)
GFR calc non Af Amer: 41 mL/min — ABNORMAL LOW (ref >60)
GLUCOSE: 234 mg/dL — AB (ref 65–99)
Potassium: 3.8 mmol/L (ref 3.5–5.1)
Sodium: 142 mmol/L (ref 135–145)

## 2015-08-16 LAB — CBC
HCT: 26.3 % — ABNORMAL LOW (ref 35.0–47.0)
Hemoglobin: 8.5 g/dL — ABNORMAL LOW (ref 12.0–16.0)
MCH: 30 pg (ref 26.0–34.0)
MCHC: 32.5 g/dL (ref 32.0–36.0)
MCV: 92.5 fL (ref 80.0–100.0)
PLATELETS: 15 10*3/uL — AB (ref 150–440)
RBC: 2.84 MIL/uL — ABNORMAL LOW (ref 3.80–5.20)
RDW: 17.5 % — AB (ref 11.5–14.5)
WBC: 9.4 10*3/uL (ref 3.6–11.0)

## 2015-08-16 LAB — PHOSPHORUS: Phosphorus: 3.4 mg/dL (ref 2.5–4.6)

## 2015-08-16 LAB — PROTIME-INR
INR: 1.48
PROTHROMBIN TIME: 18.1 s — AB (ref 11.4–15.0)

## 2015-08-16 LAB — MAGNESIUM: Magnesium: 1.7 mg/dL (ref 1.7–2.4)

## 2015-08-16 MED ORDER — INSULIN ASPART 100 UNIT/ML ~~LOC~~ SOLN
0.0000 [IU] | Freq: Four times a day (QID) | SUBCUTANEOUS | Status: DC
  Administered 2015-08-16 – 2015-08-18 (×8): 2 [IU] via SUBCUTANEOUS
  Administered 2015-08-19: 3 [IU] via SUBCUTANEOUS
  Administered 2015-08-19: 2 [IU] via SUBCUTANEOUS
  Filled 2015-08-16 (×5): qty 2
  Filled 2015-08-16: qty 3
  Filled 2015-08-16 (×4): qty 2

## 2015-08-16 MED ORDER — VANCOMYCIN HCL IN DEXTROSE 750-5 MG/150ML-% IV SOLN
750.0000 mg | INTRAVENOUS | Status: DC
  Administered 2015-08-16: 750 mg via INTRAVENOUS
  Filled 2015-08-16 (×2): qty 150

## 2015-08-16 MED ORDER — GENTAMICIN SULFATE 40 MG/ML IJ SOLN: 2.0000 mg/kg | INTRAVENOUS | Status: DC

## 2015-08-16 MED ORDER — GENTAMICIN SULFATE 40 MG/ML IJ SOLN
3.0000 mg/kg | Freq: Once | INTRAVENOUS | Status: DC
  Administered 2015-08-16: 200 mg via INTRAVENOUS
  Filled 2015-08-16: qty 5

## 2015-08-16 MED ORDER — MAGNESIUM SULFATE IN D5W 10-5 MG/ML-% IV SOLN
1.0000 g | Freq: Once | INTRAVENOUS | Status: AC
  Administered 2015-08-16: 1 g via INTRAVENOUS
  Filled 2015-08-16: qty 100

## 2015-08-16 MED ORDER — ALBUMIN HUMAN 25 % IV SOLN
12.5000 g | Freq: Once | INTRAVENOUS | Status: AC
  Administered 2015-08-16: 12.5 g via INTRAVENOUS
  Filled 2015-08-16: qty 50

## 2015-08-16 MED ORDER — EPOETIN ALFA 4000 UNIT/ML IJ SOLN
4000.0000 [IU] | INTRAMUSCULAR | Status: DC
  Administered 2015-08-16 – 2015-08-18 (×2): 4000 [IU] via INTRAVENOUS
  Filled 2015-08-16 (×3): qty 1

## 2015-08-16 MED ORDER — SODIUM CHLORIDE 0.9 % IV SOLN
6.0000 mg/kg | INTRAVENOUS | Status: DC
  Administered 2015-08-16: 492.5 mg via INTRAVENOUS
  Filled 2015-08-16 (×2): qty 9.85

## 2015-08-16 NOTE — Care Management Note (Signed)
Case Management Note  Patient Details  Name: Carena Stream MRN: [id number] Date of Birth: Dec 20, 1965  Subjective/Objective:   From [facility].  Recently left [facility]. Admitted with septic shock secondary to a Stage V sacral wound. Patient has a tach with trach collar at 40%, Dobhoff with TF.  Hemodialysis. Request from Dr. Dema Severin to seek [facility] again as patient is [facility] appropriate.   TC to [place] with [facility], awaiting response.              Action/Plan:   Expected Discharge Date:                  Expected Discharge Plan:  Long Term Acute Care (LTAC)  In-House Referral:     Discharge planning Services  CM Consult  Post Acute Care Choice:    Choice offered to:     DME Arranged:    DME Agency:     HH Arranged:    HH Agency:     Status of Service:  In process, will continue to follow  Medicare Important Message Given:    Date Medicare IM Given:    Medicare IM give by:    Date Additional Medicare IM Given:    Additional Medicare Important Message give by:     If discussed at Long Length of Stay Meetings, dates discussed:    Additional Comments:  [name], RN 08/16/2015, 10:52 AM

## 2015-08-16 NOTE — Consult Note (Signed)
ANTIBIOTIC CONSULT NOTE - INITIAL  Pharmacy Consult for daptomycin Indication: sepsis from severe sacral decubitus, VRE, MDR  Allergies  Allergen Reactions  . Contrast Media [Iodinated Diagnostic Agents] Anaphylaxis  . Ampicillin Rash    Patient Measurements: Height:  (175.3 cm) Weight: 181 lb (82.1 kg) IBW/kg (Calculated) : 66.2 Adjusted Body Weight:   Vital Signs: Temp: 97.8 F (36.6 C) (09/27 1300) Temp Source: Axillary (09/27 1300) BP: 106/59 mmHg (09/27 1500) Pulse Rate: 87 (09/27 1500) Intake/Output from previous day: 09/26 0701 - 09/27 0700 In: 800.8 [NG/GT:750.8; IV Piggyback:50] Out: -  Intake/Output from this shift: Total I/O In: 3 [I.V.:3] Out: 1000 [Other:1000]  Labs:  Recent Labs  08/14/15 0510 08/14/15 1747 08/15/15 0530 08/16/15 0422  WBC 21.0* 14.8* 14.9* 9.4  HGB 7.9* 6.8* 9.1* 8.5*  PLT 18* 43* 21* 15*  CREATININE 1.80*  --  1.11* 1.45*   Estimated Creatinine Clearance: 53.2 mL/min (by C-G formula based on Cr of 1.45). No results for input(s): VANCOTROUGH, VANCOPEAK, VANCORANDOM, GENTTROUGH, GENTPEAK, GENTRANDOM, TOBRATROUGH, TOBRAPEAK, TOBRARND, AMIKACINPEAK, AMIKACINTROU, AMIKACIN in the last 72 hours.   Microbiology: Recent Results (from the past 720 hour(s))  Culture, blood (routine x 2)     Status: None   Collection Time: 08/01/15  7:00 PM  Result Value Ref Range Status   Specimen Description BLOOD RIGHT ANTECUBITAL  Final   Special Requests BOTTLES DRAWN AEROBIC ONLY 5CC  Final   Culture NO GROWTH 5 DAYS  Final   Report Status 08/06/2015 FINAL  Final  Culture, blood (routine x 2)     Status: None   Collection Time: 08/01/15  7:10 PM  Result Value Ref Range Status   Specimen Description BLOOD RIGHT HAND  Final   Special Requests IN PEDIATRIC BOTTLE 3CC  Final   Culture  Setup Time   Final    GRAM POSITIVE COCCI IN CLUSTERS AEROBIC BOTTLE ONLY CRITICAL RESULT CALLED TO, READ BACK BY AND VERIFIED WITH: D TAYLOR,RN AT 1115  08/02/15 BY L BENFIELD    Culture STAPHYLOCOCCUS SPECIES (COAGULASE NEGATIVE)  Final   Report Status 08/06/2015 FINAL  Final   Organism ID, Bacteria STAPHYLOCOCCUS SPECIES (COAGULASE NEGATIVE)  Final      Susceptibility   Staphylococcus species (coagulase negative) - MIC*    CIPROFLOXACIN >=8 RESISTANT Resistant     ERYTHROMYCIN <=0.25 SENSITIVE Sensitive     GENTAMICIN 8 INTERMEDIATE Intermediate     OXACILLIN >=4 RESISTANT Resistant     TETRACYCLINE <=1 SENSITIVE Sensitive     VANCOMYCIN 1 SENSITIVE Sensitive     TRIMETH/SULFA 160 RESISTANT Resistant     CLINDAMYCIN <=0.25 SENSITIVE Sensitive     RIFAMPIN <=0.5 SENSITIVE Sensitive     Inducible Clindamycin NEGATIVE Sensitive     * STAPHYLOCOCCUS SPECIES (COAGULASE NEGATIVE)  Culture, blood (routine x 2)     Status: None   Collection Time: 08/02/15  7:00 PM  Result Value Ref Range Status   Specimen Description BLOOD RIGHT ANTECUBITAL  Final   Special Requests BOTTLES DRAWN AEROBIC ONLY 10CC  Final   Culture  Setup Time   Final    GRAM POSITIVE COCCI IN CLUSTERS AEROBIC BOTTLE ONLY CRITICAL RESULT CALLED TO, READ BACK BY AND VERIFIED WITH: DR Gwenevere Abbot RN 1549 08/03/15 A BROWNING    Culture   Final    STAPHYLOCOCCUS SPECIES (COAGULASE NEGATIVE) SUSCEPTIBILITIES PERFORMED ON PREVIOUS CULTURE WITHIN THE LAST 5 DAYS.    Report Status 08/06/2015 FINAL  Final  Culture, blood (routine x 2)  Status: None   Collection Time: 08/02/15  7:05 PM  Result Value Ref Range Status   Specimen Description BLOOD RIGHT HAND  Final   Special Requests BOTTLES DRAWN AEROBIC AND ANAEROBIC 10CC  Final   Culture NO GROWTH 5 DAYS  Final   Report Status 08/07/2015 FINAL  Final  Blood Culture (routine x 2)     Status: None (Preliminary result)   Collection Time: 07/31/2015  8:51 AM  Result Value Ref Range Status   Specimen Description BLOOD UNKO  Final   Special Requests BOTTLES DRAWN AEROBIC AND ANAEROBIC  3CC  Final   Culture NO GROWTH 4 DAYS  Final    Report Status PENDING  Incomplete  Blood Culture (routine x 2)     Status: None (Preliminary result)   Collection Time: 07/23/2015  9:20 AM  Result Value Ref Range Status   Specimen Description BLOOD LEFT ARM  Final   Special Requests BOTTLES DRAWN AEROBIC AND ANAEROBIC  1CC  Final   Culture NO GROWTH 4 DAYS  Final   Report Status PENDING  Incomplete  Wound culture     Status: None   Collection Time: 08/05/2015  9:20 AM  Result Value Ref Range Status   Specimen Description DECUBITIS  Final   Special Requests Normal  Final   Gram Stain   Final    FEW WBC SEEN MANY GRAM NEGATIVE RODS RARE GRAM POSITIVE COCCI    Culture   Final    HEAVY GROWTH ESCHERICHIA COLI MODERATE GROWTH ENTEROBACTER AEROGENES PROTEUS MIRABILIS HEAVY GROWTH ENTEROCOCCUS SPECIES VRE HAVE INTRINSIC RESISTANCE TO MOST COMMONLY USED ANTIBIOTICS AND THE ABILITY TO ACQUIRE RESISTANCE TO MOST AVAILABLE ANTIBIOTICS.    Report Status 08/16/2015 FINAL  Final   Organism ID, Bacteria ESCHERICHIA COLI  Final   Organism ID, Bacteria ENTEROBACTER AEROGENES  Final   Organism ID, Bacteria PROTEUS MIRABILIS  Final   Organism ID, Bacteria ENTEROCOCCUS SPECIES  Final      Susceptibility   Enterobacter aerogenes - MIC*    CEFTAZIDIME <=1 SENSITIVE Sensitive     CEFAZOLIN >=64 RESISTANT Resistant     CEFTRIAXONE <=1 SENSITIVE Sensitive     CIPROFLOXACIN <=0.25 SENSITIVE Sensitive     GENTAMICIN <=1 SENSITIVE Sensitive     IMIPENEM 1 SENSITIVE Sensitive     TRIMETH/SULFA <=20 SENSITIVE Sensitive     * MODERATE GROWTH ENTEROBACTER AEROGENES   Escherichia coli - MIC*    AMPICILLIN <=2 SENSITIVE Sensitive     CEFTAZIDIME <=1 SENSITIVE Sensitive     CEFAZOLIN <=4 SENSITIVE Sensitive     CEFTRIAXONE <=1 SENSITIVE Sensitive     CIPROFLOXACIN <=0.25 SENSITIVE Sensitive     GENTAMICIN <=1 SENSITIVE Sensitive     IMIPENEM <=0.25 SENSITIVE Sensitive     TRIMETH/SULFA <=20 SENSITIVE Sensitive     * HEAVY GROWTH ESCHERICHIA COLI    Proteus mirabilis - MIC*    AMPICILLIN >=32 RESISTANT Resistant     CEFTAZIDIME <=1 SENSITIVE Sensitive     CEFAZOLIN 8 SENSITIVE Sensitive     CEFTRIAXONE <=1 SENSITIVE Sensitive     CIPROFLOXACIN <=0.25 SENSITIVE Sensitive     GENTAMICIN <=1 SENSITIVE Sensitive     IMIPENEM 1 SENSITIVE Sensitive     TRIMETH/SULFA <=20 SENSITIVE Sensitive     * PROTEUS MIRABILIS   Enterococcus species - MIC*    AMPICILLIN >=32 RESISTANT Resistant     VANCOMYCIN >=32 RESISTANT Resistant     GENTAMICIN SYNERGY SENSITIVE Sensitive     TETRACYCLINE Value in next  row Resistant      RESISTANT>=16    * HEAVY GROWTH ENTEROCOCCUS SPECIES  MRSA PCR Screening     Status: None   Collection Time: Aug 16, 2015  2:38 PM  Result Value Ref Range Status   MRSA by PCR NEGATIVE NEGATIVE Final    Comment:        The GeneXpert MRSA Assay (FDA approved for NASAL specimens only), is one component of a comprehensive MRSA colonization surveillance program. It is not intended to diagnose MRSA infection nor to guide or monitor treatment for MRSA infections.   Blood culture (single)     Status: None (Preliminary result)   Collection Time: 08-16-2015  3:36 PM  Result Value Ref Range Status   Specimen Description BLOOD RIGHT ASSIST CONTROL  Final   Special Requests BOTTLES DRAWN AEROBIC AND ANAEROBIC  Final   Culture NO GROWTH 4 DAYS  Final   Report Status PENDING  Incomplete  Wound culture     Status: None (Preliminary result)   Collection Time: 08/13/15 12:37 PM  Result Value Ref Range Status   Specimen Description WOUND  Final   Special Requests Normal  Final   Gram Stain   Final    FEW WBC SEEN TOO NUMEROUS TO COUNT GRAM NEGATIVE RODS FEW GRAM POSITIVE COCCI    Culture   Final    HEAVY GROWTH ESCHERICHIA COLI MODERATE GROWTH PROTEUS MIRABILIS LIGHT GROWTH KLEBSIELLA PNEUMONIAE MODERATE GROWTH ENTEROCOCCUS GALLINARUM CRITICAL RESULT CALLED TO, READ BACK BY AND VERIFIED WITH: [facility] BORBA AT 1042 08/16/15 DV     Report Status PENDING  Incomplete   Organism ID, Bacteria ESCHERICHIA COLI  Final   Organism ID, Bacteria PROTEUS MIRABILIS  Final   Organism ID, Bacteria KLEBSIELLA PNEUMONIAE  Final   Organism ID, Bacteria ENTEROCOCCUS GALLINARUM  Final      Susceptibility   Escherichia coli - MIC*    AMPICILLIN >=32 RESISTANT Resistant     CEFTAZIDIME 4 RESISTANT Resistant     CEFAZOLIN >=64 RESISTANT Resistant     CEFTRIAXONE 16 RESISTANT Resistant     GENTAMICIN 2 SENSITIVE Sensitive     IMIPENEM >=16 RESISTANT Resistant     TRIMETH/SULFA <=20 SENSITIVE Sensitive     Extended ESBL POSITIVE Resistant     PIP/TAZO Value in next row Resistant      RESISTANT>=128    CIPROFLOXACIN Value in next row Sensitive      SENSITIVE<=0.25    * HEAVY GROWTH ESCHERICHIA COLI   Klebsiella pneumoniae - MIC*    AMPICILLIN Value in next row Resistant      SENSITIVE<=0.25    CEFTAZIDIME Value in next row Resistant      SENSITIVE<=0.25    CEFAZOLIN Value in next row Resistant      SENSITIVE<=0.25    CEFTRIAXONE Value in next row Resistant      SENSITIVE<=0.25    CIPROFLOXACIN Value in next row Resistant      SENSITIVE<=0.25    GENTAMICIN Value in next row Sensitive      SENSITIVE<=0.25    IMIPENEM Value in next row Resistant      SENSITIVE<=0.25    TRIMETH/SULFA Value in next row Resistant      SENSITIVE<=0.25    PIP/TAZO Value in next row Resistant      RESISTANT>=128    * LIGHT GROWTH KLEBSIELLA PNEUMONIAE   Proteus mirabilis - MIC*    AMPICILLIN Value in next row Resistant      RESISTANT>=128    CEFTAZIDIME Value in  next row Sensitive      RESISTANT>=128    CEFAZOLIN Value in next row Sensitive      RESISTANT>=128    CEFTRIAXONE Value in next row Sensitive      RESISTANT>=128    CIPROFLOXACIN Value in next row Sensitive      RESISTANT>=128    GENTAMICIN Value in next row Sensitive      RESISTANT>=128    IMIPENEM Value in next row Sensitive      RESISTANT>=128    TRIMETH/SULFA Value in next  row Sensitive      RESISTANT>=128    PIP/TAZO Value in next row Sensitive      SENSITIVE<=4    * MODERATE GROWTH PROTEUS MIRABILIS   Enterococcus gallinarum - MIC*    AMPICILLIN Value in next row Resistant      SENSITIVE<=4    GENTAMICIN SYNERGY Value in next row Sensitive      SENSITIVE<=4    CIPROFLOXACIN Value in next row Resistant      RESISTANT>=8    TETRACYCLINE Value in next row Resistant      RESISTANT>=16    * MODERATE GROWTH ENTEROCOCCUS GALLINARUM  Wound culture     Status: None (Preliminary result)   Collection Time: 08/15/15  2:53 PM  Result Value Ref Range Status   Specimen Description WOUND  Final   Special Requests NONE  Final   Gram Stain PENDING  Incomplete   Culture NO GROWTH < 24 HOURS  Final   Report Status PENDING  Incomplete    Medical History: Past Medical History  Diagnosis Date  . Obesity   . Dyslipidemia   . Hypertension   . Coronary artery disease     s/p BMS 2010 LAD  . Dysrhythmia     ventricular tachycardia resolved after LAD stent and beta blocker  . Diabetes mellitus     with retinopathy, neuropathy and microalbuminemia  . ESRD (end stage renal disease) on dialysis     Medications:  Scheduled:  . sodium chloride   Intravenous Once  . sodium chloride   Intravenous Once  . epoetin (EPOGEN/PROCRIT) injection  4,000 Units Intravenous Q T,Th,Sa-HD  . feeding supplement (PRO-STAT SUGAR FREE 64)  30 mL Per Tube TID  . free water  100 mL Per Tube 3 times per day  . insulin aspart  0-15 Units Subcutaneous 4 times per day  . sodium chloride  3 mL Intravenous Q12H  . sodium hypochlorite   Irrigation TID   Assessment: Pt is a 49 year old female with sepsis from severe sacral decubitus growing MDRO inculding VRE. Dr fitzgerald consulted pharmacy to dose daptomycin. Pt is a HD TThSat pt  Goal of Therapy:  resoltuion of infection  Plan:  will dose /kg IV q 48 hours due to HD~ , will round to  q 48 hours  Melissa D  Maccia 08/16/2015,4:33 PM

## 2015-08-16 NOTE — Progress Notes (Signed)
Subjective:  Pt still in CCU.  Not waking up or following commands at this time. Opens eyes per nursing VS stable. WBC count improving Plan for HD this AM Tube feeds are running without problem ABG from yesterday was unremarkable  Objective:  Vital signs in last 24 hours:  Temp:  [97.9 F (36.6 C)-98.7 F (37.1 C)] 98 F (36.7 C) (09/27 0700) Pulse Rate:  [85-103] 94 (09/27 0700) Resp:  [10-30] 30 (09/27 0700) BP: (110-145)/(39-82) 135/54 mmHg (09/27 0700) SpO2:  [96 %-100 %] 100 % (09/27 0700) FiO2 (%):  [28 %] 28 % (09/27 0510)  Weight change:  Filed Weights   08/06/2015 1300 08/14/15 1140 08/15/15 0633  Weight: 74.571 kg (164 lb 6.4 oz) 80.7 kg (177 lb 14.6 oz) 76.4 kg (168 lb 6.9 oz)    Intake/Output: I/O last 3 completed shifts: In: 1270.8 [Blood:420; NG/GT:750.8; IV Piggyback:100] Out: -    Intake/Output this shift:     Physical Exam: General: Chronically ill appearing  Head: Normocephalic, atraumatic. Moist oral mucosal membranes  Eyes: Eyes closed  Neck: Trach in place  Lungs:  Scattered rhonchi, normal effort  Heart: S1S2 no rubs  Abdomen:  Soft, nontender, BS present  Extremities:  + dependent and peripheral edema, b/l feet in soft support  Neurologic: Lethargic, not responsive to verbal commands  Skin: No lesions  Access: R IJ permcath    Basic Metabolic Panel:  Recent Labs Lab 08/17/2015 0850 08/13/15 0250 08/14/15 0510 08/14/15 1203 08/15/15 0530 08/16/15 0422  NA 141 138 136  --  141 142  K 2.3* 3.4* 4.2  --  4.0 3.8  CL 103 103 102  --  104 105  CO2 --  32 31  GLUCOSE 87 123* 95  --  76 234*  BUN 17 19 27*  --  17 25*  CREATININE 1.29* 1.41* 1.80*  --  1.11* 1.45*  CALCIUM 7.1* 7.5* 7.5*  --  7.8* 7.6*  MG 1.4* 1.8  --   --   --  1.7  PHOS  --   --   --  1.6* 1.6* 3.4    Liver Function Tests:  Recent Labs Lab 08/11/2015 0850  AST 16  ALT 13*  ALKPHOS 175*  BILITOT 0.9  PROT 4.4*  ALBUMIN 1.6*   No results for  input(s): LIPASE, AMYLASE in the last 168 hours. No results for input(s): AMMONIA in the last 168 hours.  CBC:  Recent Labs Lab 08/10/2015 0850 08/13/15 0250 08/14/15 0510 08/14/15 1747 08/15/15 0530 08/16/15 0422  WBC 15.7* 30.3* 21.0* 14.8* 14.9* 9.4  NEUTROABS 13.8*  --   --  14.0*  --   --   HGB 6.4* 8.7* 7.9* 6.8* 9.1* 8.5*  HCT 20.5* 27.4* 24.9* 21.4* 27.8* 26.3*  MCV 95.5 93.3 93.4 92.8 91.8 92.5  PLT 52* 46* 18* 43* 21* 15*    Cardiac Enzymes:  Recent Labs Lab 07/26/2015 0850  TROPONINI 0.22*    BNP: Invalid input(s): POCBNP  CBG:  Recent Labs Lab 08/15/15 0724 08/15/15 1153 08/15/15 1600 08/15/15 2111 08/16/15 0710  GLUCAP 73 71 72 123* 249*    Microbiology: Results for orders placed or performed during the hospital encounter of 08/06/2015  Blood Culture (routine x 2)     Status: None (Preliminary result)   Collection Time: 08/01/2015  8:51 AM  Result Value Ref Range Status   Specimen Description BLOOD [name]  Final   Special Requests BOTTLES DRAWN AEROBIC AND ANAEROBIC  3CC  Final   Culture NO GROWTH 3 DAYS  Final   Report Status PENDING  Incomplete  Blood Culture (routine x 2)     Status: None (Preliminary result)   Collection Time: 02-Sep-2015  9:20 AM  Result Value Ref Range Status   Specimen Description BLOOD LEFT ARM  Final   Special Requests BOTTLES DRAWN AEROBIC AND ANAEROBIC  1CC  Final   Culture NO GROWTH 3 DAYS  Final   Report Status PENDING  Incomplete  Wound culture     Status: None (Preliminary result)   Collection Time: 2015-09-02  9:20 AM  Result Value Ref Range Status   Specimen Description DECUBITIS  Final   Special Requests Normal  Final   Gram Stain   Final    FEW WBC SEEN MANY GRAM NEGATIVE RODS RARE GRAM POSITIVE COCCI    Culture   Final    HEAVY GROWTH ESCHERICHIA COLI MODERATE GROWTH ENTEROBACTER AEROGENES PROTEUS MIRABILIS HEAVY GROWTH ENTEROCOCCUS FAECIUM REPEATING SENISITIVITIES FOR VRE    Report Status PENDING   Incomplete   Organism ID, Bacteria ESCHERICHIA COLI  Final   Organism ID, Bacteria ENTEROBACTER AEROGENES  Final   Organism ID, Bacteria PROTEUS MIRABILIS  Final   Organism ID, Bacteria ENTEROCOCCUS FAECIUM  Final      Susceptibility   Enterobacter aerogenes - MIC*    CEFTAZIDIME <=1 SENSITIVE Sensitive     CEFAZOLIN >=64 RESISTANT Resistant     CEFTRIAXONE <=1 SENSITIVE Sensitive     CIPROFLOXACIN <=0.25 SENSITIVE Sensitive     GENTAMICIN <=1 SENSITIVE Sensitive     IMIPENEM 1 SENSITIVE Sensitive     TRIMETH/SULFA <=20 SENSITIVE Sensitive     * MODERATE GROWTH ENTEROBACTER AEROGENES   Escherichia coli - MIC*    AMPICILLIN <=2 SENSITIVE Sensitive     CEFTAZIDIME <=1 SENSITIVE Sensitive     CEFAZOLIN <=4 SENSITIVE Sensitive     CEFTRIAXONE <=1 SENSITIVE Sensitive     CIPROFLOXACIN <=0.25 SENSITIVE Sensitive     GENTAMICIN <=1 SENSITIVE Sensitive     IMIPENEM <=0.25 SENSITIVE Sensitive     TRIMETH/SULFA <=20 SENSITIVE Sensitive     * HEAVY GROWTH ESCHERICHIA COLI   Proteus mirabilis - MIC*    AMPICILLIN >=32 RESISTANT Resistant     CEFTAZIDIME <=1 SENSITIVE Sensitive     CEFAZOLIN 8 SENSITIVE Sensitive     CEFTRIAXONE <=1 SENSITIVE Sensitive     CIPROFLOXACIN <=0.25 SENSITIVE Sensitive     GENTAMICIN <=1 SENSITIVE Sensitive     IMIPENEM 1 SENSITIVE Sensitive     TRIMETH/SULFA <=20 SENSITIVE Sensitive     * PROTEUS MIRABILIS  MRSA PCR Screening     Status: None   Collection Time: 2015-09-02  2:38 PM  Result Value Ref Range Status   MRSA by PCR NEGATIVE NEGATIVE Final    Comment:        The GeneXpert MRSA Assay (FDA approved for NASAL specimens only), is one component of a comprehensive MRSA colonization surveillance program. It is not intended to diagnose MRSA infection nor to guide or monitor treatment for MRSA infections.   Blood culture (single)     Status: None (Preliminary result)   Collection Time: 09/02/2015  3:36 PM  Result Value Ref Range Status   Specimen  Description BLOOD RIGHT ASSIST CONTROL  Final   Special Requests BOTTLES DRAWN AEROBIC AND ANAEROBIC  Final   Culture NO GROWTH 3 DAYS  Final   Report Status PENDING  Incomplete  Wound culture  Status: None (Preliminary result)   Collection Time: 08/13/15 12:37 PM  Result Value Ref Range Status   Specimen Description WOUND  Final   Special Requests Normal  Final   Gram Stain   Final    FEW WBC SEEN TOO NUMEROUS TO COUNT GRAM NEGATIVE RODS FEW GRAM POSITIVE COCCI    Culture HEAVY GROWTH GRAM NEGATIVE RODS ONCE ISOLATED   Final   Report Status PENDING  Incomplete    Coagulation Studies: No results for input(s): LABPROT, INR in the last 72 hours.  Urinalysis: No results for input(s): COLORURINE, LABSPEC, PHURINE, GLUCOSEU, HGBUR, BILIRUBINUR, KETONESUR, PROTEINUR, UROBILINOGEN, NITRITE, LEUKOCYTESUR in the last 72 hours.  Invalid input(s): APPERANCEUR    Imaging: Dg Abd 1 View  08/15/2015   CLINICAL DATA:  OG tube placement  EXAM: ABDOMEN - 1 VIEW  COMPARISON:  06/15/2015  FINDINGS: OG tube tip is in the distal stomach. Nonobstructive bowel gas pattern. Linear high density areas throughout the abdomen, likely peritoneal calcifications. No visible free air.  IMPRESSION: OG tube tip in the distal stomach.   Electronically Signed   By: Charlett Nose M.D.   On: 08/15/2015 10:12     Medications:   . feeding supplement (VITAL 1.5 CAL) 1,000 mL (08/15/15 1459)  . norepinephrine (LEVOPHED) Adult infusion 1 mcg/min (08/13/15 1637)   . sodium chloride   Intravenous Once  . sodium chloride   Intravenous Once  . feeding supplement (PRO-STAT SUGAR FREE 64)  30 mL Per Tube TID  . free water  100 mL Per Tube 3 times per day  . hydrocortisone sod succinate (SOLU-CORTEF) inj  50 mg Intravenous Q12H  . insulin aspart  0-15 Units Subcutaneous TID WC  . meropenem (MERREM) IV  500 mg Intravenous Q24H  . sodium chloride  3 mL Intravenous Q12H  . sodium hypochlorite   Irrigation TID  .  vancomycin  750 mg Intravenous Q T,Th,Sa-HD   sodium chloride, sodium chloride, alteplase, lidocaine (PF), lidocaine-prilocaine, pentafluoroprop-tetrafluoroeth  Assessment/ Plan:  49 y.o. 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  1. End-stage renal disease on hemodialysis on HD TTHS. The patient has been on dialysis since October of 2014.  -plan for HD today via  R IJ permcath.   2. Anemia of CKD: received blood transfusion this admission. - start EPO  3. Secondary hyperparathyroidism: phos low at 1.6. Replaced per icu protocol  4. Septic shock: Multiple potential sources, has two catheters in place, also has a sacral decubitis, and also has trach in place.  -blood cultures thus far negative.    - multiple organisms growing via wound culture  5. peripheral edema - UF with HD with iv albumin support    6.  Thrombocytopenia:  Platelets declining, defer further work up to [employer] and hospitalist.  pack catheter with citrate  patient's husband - Courtney [name]   LOS: 4 Ailah Barna 9/27/20168:21 AM

## 2015-08-16 NOTE — Progress Notes (Signed)
eLink Physician-Brief Progress Note Patient Name: Courtney [name] DOB: Jun 08, 1966 MRN: [id number]   Date of Service  08/16/2015  HPI/Events of Note  mico back mulitple gram neg organisms, likley covered by IMI E Faecium also noted Off vanc Plat 21 k   eICU Interventions  Would prefer empiric linazolid, reluctant with plat count Add back vanc follow sens     Intervention Category Major Interventions: Infection - evaluation and management  FEINSTEIN,DANIEL J. 08/16/2015, 1:30 AM

## 2015-08-16 NOTE — Consult Note (Signed)
[facility] Infectious Disease     Reason for Consult: Mdro infection   Referring Physician: Gouru Date of Admission:  07/23/2015   Active Problems:   Septic shock   Pressure ulcer   Decubitus ulcer  HPI: Courtney [name] is a 49 y.o. female with complicated medical and surgical history admitted with sepsis likely from sacral decubitus ulcer.  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  She has been on meropenem and vancomycin. She has had surgical debridement at bedside 9/24. Clinically has improved some since admission, off pressors 48 hours, wbc down.  Past Medical History  Diagnosis Date  . Obesity   . Dyslipidemia   . Hypertension   . Coronary artery disease     s/p BMS 2010 LAD  . Dysrhythmia     ventricular tachycardia resolved after LAD stent and beta blocker  . Diabetes mellitus     with retinopathy, neuropathy and microalbuminemia  . ESRD (end stage renal disease) on dialysis    Past Surgical History  Procedure Laterality Date  . Cholecystectomy  1990  . Pci lad  12/2010  . Colonoscopy  08/2011  . Left heart catheterization with coronary angiogram N/A 01/28/2012    Procedure: LEFT HEART CATHETERIZATION WITH CORONARY ANGIOGRAM;  Surgeon: Lesleigh Noe, MD;  Location: [facility] CATH LAB;  Service: Cardiovascular;  Laterality: N/A;  . Tracheostomy    . Gastric bypass     Social History  Substance Use Topics  . Smoking status: Never Smoker   . Smokeless tobacco: Never Used  . Alcohol Use: No     Comment: occassional   Family History  Problem Relation Age of Onset  . Hypertension Father   . Diabetes Father   . Cancer Mother   . Diabetes Paternal Grandfather   . Hypertension Paternal Grandfather   . Diabetes Paternal Grandmother   . Hypertension Paternal Grandmother   . Cancer Maternal Grandmother     colon ca    Allergies:  Allergies  Allergen Reactions  . Contrast Media [Iodinated Diagnostic Agents] Anaphylaxis  .  Ampicillin Rash    Current antibiotics: Antibiotics Given (last 72 hours)    Date/Time Action Medication Dose Rate   08/13/15 1930 Given   meropenem (MERREM) 500 mg in sodium chloride 0.9 % 50 mL IVPB 500 mg 100 mL/hr   08/14/15 1949 Given   meropenem (MERREM) 500 mg in sodium chloride 0.9 % 50 mL IVPB 500 mg 100 mL/hr   08/15/15 1950 Given   meropenem (MERREM) 500 mg in sodium chloride 0.9 % 50 mL IVPB 500 mg 100 mL/hr   08/16/15 1246 Given   vancomycin (VANCOCIN) IVPB 750 mg/150 ml premix 750 mg 150 mL/hr      MEDICATIONS: . sodium chloride   Intravenous Once  . sodium chloride   Intravenous Once  . epoetin (EPOGEN/PROCRIT) injection  4,000 Units Intravenous Q T,Th,Sa-HD  . feeding supplement (PRO-STAT SUGAR FREE 64)  30 mL Per Tube TID  . free water  100 mL Per Tube 3 times per day  . [START ON 08/18/2015] gentamicin  2 mg/kg (Ideal) Intravenous Q T,Th,Sat-1800  . gentamicin  3 mg/kg (Ideal) Intravenous Once  . insulin aspart  0-15 Units Subcutaneous 4 times per day  . sodium chloride  3 mL Intravenous Q12H  . sodium hypochlorite   Irrigation TID  . vancomycin  750 mg Intravenous Q T,Th,Sa-HD    Review of Systems - 11 systems reviewed and negative per HPI  OBJECTIVE: Temp:  [97.8 F (36.6 C)-98.8 F (37.1 C)] 97.8 F (36.6 C) (09/27 1300) Pulse Rate:  [85-112] 88 (09/27 1300) Resp:  [10-30] 19 (09/27 1300) BP: (88-141)/(39-77) 99/72 mmHg (09/27 1300) SpO2:  [96 %-100 %] 100 % (09/27 1219) FiO2 (%):  [28 %] 28 % (09/27 1219) Weight:  [82.1 kg (181 lb)-83.5 kg (184 lb 1.4 oz)] 82.1 kg (181 lb) (09/27 1300) Physical Exam  Constitutional:  Obese, trach, lying in bed, able to open eyes, non verbal current HENT: [place]/AT, PERRLA, no scleral icterus Mouth/Throat: Oropharynx is clear and dry . No oropharyngeal exudate.  Trach site wnl Access- L chest wall CVC wnl Cardiovascular: Normal rate, regular rhythm and normal heart sounds. Exam reveals no gallop and no friction rub.   Pulmonary/Chest: Effort normal and breath sounds normal. No respiratory distress.  has no wheezes.  Neck -upple, no nuchal rigidity Abdominal: Soft. Bowel sounds are normal.  exhibits no distension. There is no tenderness.  Lymphadenopathy: no cervical adenopathy. No axillary adenopathy Neurological: lethargic, opens eyes but non verbal Skin: sacral wound - larger than softball, to bone, extensive undermining and necrotic slough   LABS: Results for orders placed or performed during the hospital encounter of 08/07/2015 (from the past 48 hour(s))  Glucose, capillary     Status: None   Collection Time: 08/14/15  4:09 PM  Result Value Ref Range   Glucose-Capillary 87 65 - 99 mg/dL  CBC with Differential/Platelet     Status: Abnormal   Collection Time: 08/14/15  5:47 PM  Result Value Ref Range   WBC 14.8 (H) 3.6 - 11.0 K/uL   RBC 2.30 (L) 3.80 - 5.20 MIL/uL   Hemoglobin 6.8 (L) 12.0 - 16.0 g/dL   HCT 36.4 (L) 38.3 - 77.9 %   MCV 92.8 80.0 - 100.0 fL   MCH 29.3 26.0 - 34.0 pg   MCHC 31.6 (L) 32.0 - 36.0 g/dL   RDW 39.6 (H) 88.6 - 48.4 %   Platelets 43 (L) 150 - 440 K/uL   Neutrophils Relative % 95% %   Neutro Abs 14.0 (H) 1.4 - 6.5 K/uL   Lymphocytes Relative 3% %   Lymphs Abs 0.5 (L) 1.0 - 3.6 K/uL   Monocytes Relative 2% %   Monocytes Absolute 0.3 0.2 - 0.9 K/uL   Eosinophils Relative 0% %   Eosinophils Absolute 0.0 0 - 0.7 K/uL   Basophils Relative 0% %   Basophils Absolute 0.0 0 - 0.1 K/uL  Prepare RBC     Status: None   Collection Time: 08/14/15  9:42 PM  Result Value Ref Range   Order Confirmation ORDER PROCESSED BY BLOOD BANK   Glucose, capillary     Status: None   Collection Time: 08/14/15 10:13 PM  Result Value Ref Range   Glucose-Capillary 75 65 - 99 mg/dL  Basic metabolic panel     Status: Abnormal   Collection Time: 08/15/15  5:30 AM  Result Value Ref Range   Sodium 141 135 - 145 mmol/L   Potassium 4.0 3.5 - 5.1 mmol/L   Chloride 104 101 - 111 mmol/L   CO2 32 22  - 32 mmol/L   Glucose, Bld 76 65 - 99 mg/dL   BUN 17 6 - 20 mg/dL   Creatinine, Ser 7.20 (H) 0.44 - 1.00 mg/dL   Calcium 7.8 (L) 8.9 - 10.3 mg/dL   GFR calc non Af Amer 57 (L) >60 mL/min   GFR calc Af Amer >60 >60 mL/min  Comment: (NOTE) The eGFR has been calculated using the CKD EPI equation. This calculation has not been validated in all clinical situations. eGFR's persistently <60 mL/min signify possible Chronic Kidney Disease.    Anion gap 5 5 - 15  CBC     Status: Abnormal   Collection Time: 08/15/15  5:30 AM  Result Value Ref Range   WBC 14.9 (H) 3.6 - 11.0 K/uL   RBC 3.02 (L) 3.80 - 5.20 MIL/uL   Hemoglobin 9.1 (L) 12.0 - 16.0 g/dL    Comment: RESULT REPEATED AND VERIFIED   HCT 27.8 (L) 35.0 - 47.0 %   MCV 91.8 80.0 - 100.0 fL   MCH 30.1 26.0 - 34.0 pg   MCHC 32.7 32.0 - 36.0 g/dL   RDW 75.8 (H) 26.2 - 63.4 %   Platelets 21 (LL) 150 - 440 K/uL    Comment: PLATELET COUNT CONFIRMED BY SMEAR CRITICAL RESULT CALLED TO, READ BACK BY AND VERIFIED WITH: CHERYL SMITH ON 08/15/15 AT 0639 BY QSD   Phosphorus     Status: Abnormal   Collection Time: 08/15/15  5:30 AM  Result Value Ref Range   Phosphorus 1.6 (L) 2.5 - 4.6 mg/dL  Glucose, capillary     Status: None   Collection Time: 08/15/15  7:24 AM  Result Value Ref Range   Glucose-Capillary 73 65 - 99 mg/dL  Blood gas, arterial     Status: Abnormal   Collection Time: 08/15/15  8:50 AM  Result Value Ref Range   FIO2 0.28    pH, Arterial 7.47 (H) 7.350 - 7.450   pCO2 arterial 44 32.0 - 48.0 mmHg   pO2, Arterial 65 (L) 83.0 - 108.0 mmHg   Bicarbonate 32.0 (H) 21.0 - 28.0 mEq/L   Acid-Base Excess 7.4 (H) 0.0 - 3.0 mmol/L   O2 Saturation 93.7 %   Patient temperature 37.0    Collection site RIGHT RADIAL    Sample type ARTERIAL DRAW    Allens test (pass/fail) POSITIVE (A) PASS  Glucose, capillary     Status: None   Collection Time: 08/15/15 11:53 AM  Result Value Ref Range   Glucose-Capillary 71 65 - 99 mg/dL  Iron and  TIBC     Status: None   Collection Time: 08/15/15  2:53 PM  Result Value Ref Range   Iron 31 28 - 170 ug/dL   TIBC UNABLE TO CALCULATE DUE TO LOW TRANSFERRIN 250 - 450 ug/dL   Saturation Ratios UNABLE TO CALCULATE DUE TO LOW TRANSFERIN 10.4 - 31.8 %   UIBC UNABLE TO CALCULATE DUE TO LOW TRANSFERIN ug/dL  Ferritin     Status: Abnormal   Collection Time: 08/15/15  2:53 PM  Result Value Ref Range   Ferritin 802 (H) 11 - 307 ng/mL  Fibrinogen     Status: None   Collection Time: 08/15/15  2:53 PM  Result Value Ref Range   Fibrinogen 330 210 - 470 mg/dL  Fibrin Degradation Products ([facility] only)     Status: Abnormal   Collection Time: 08/15/15  2:53 PM  Result Value Ref Range   Fibrin Degradation Prod. NEGATIVE (A) <10 ug/mL  Fibrin derivatives D-Dimer ([facility] only)     Status: Abnormal   Collection Time: 08/15/15  2:53 PM  Result Value Ref Range   Fibrin derivatives D-dimer ([facility]) 1057 (H) 0 - 499    Comment: <> Exclusion of Venous Thromboembolism (VTE) - OUTPATIENTS ONLY        (Emergency Department or [place])  0-499 ng/ml (FEU)  : With a low to intermediate pretest                                        probability for VTE this test result                                        excludes the diagnosis of VTE.           > 499 ng/ml (FEU)  : VTE not excluded.  Additional work up                                   for VTE is required.   <>  Testing on Inpatients and Evaluation of Disseminated Intravascular        Coagulation ([facility])             Reference Range:   0-499 ng/ml (FEU)   Wound culture     Status: None (Preliminary result)   Collection Time: 08/15/15  2:53 PM  Result Value Ref Range   Specimen Description WOUND    Special Requests NONE    Gram Stain PENDING    Culture NO GROWTH < 24 HOURS    Report Status PENDING   Glucose, capillary     Status: None   Collection Time: 08/15/15  4:00 PM  Result Value Ref Range   Glucose-Capillary 72 65 - 99 mg/dL  Glucose,  capillary     Status: Abnormal   Collection Time: 08/15/15  9:11 PM  Result Value Ref Range   Glucose-Capillary 123 (H) 65 - 99 mg/dL  Basic metabolic panel     Status: Abnormal   Collection Time: 08/16/15  4:22 AM  Result Value Ref Range   Sodium 142 135 - 145 mmol/L   Potassium 3.8 3.5 - 5.1 mmol/L   Chloride 105 101 - 111 mmol/L   CO2 31 22 - 32 mmol/L   Glucose, Bld 234 (H) 65 - 99 mg/dL   BUN 25 (H) 6 - 20 mg/dL   Creatinine, Ser 1.45 (H) 0.44 - 1.00 mg/dL   Calcium 7.6 (L) 8.9 - 10.3 mg/dL   GFR calc non Af Amer 41 (L) >60 mL/min   GFR calc Af Amer 48 (L) >60 mL/min    Comment: (NOTE) The eGFR has been calculated using the CKD EPI equation. This calculation has not been validated in all clinical situations. eGFR's persistently <60 mL/min signify possible Chronic Kidney Disease.    Anion gap 6 5 - 15  CBC     Status: Abnormal   Collection Time: 08/16/15  4:22 AM  Result Value Ref Range   WBC 9.4 3.6 - 11.0 K/uL   RBC 2.84 (L) 3.80 - 5.20 MIL/uL   Hemoglobin 8.5 (L) 12.0 - 16.0 g/dL   HCT 26.3 (L) 35.0 - 47.0 %   MCV 92.5 80.0 - 100.0 fL   MCH 30.0 26.0 - 34.0 pg   MCHC 32.5 32.0 - 36.0 g/dL   RDW 17.5 (H) 11.5 - 14.5 %   Platelets 15 (LL) 150 - 440 K/uL    Comment: CRITICAL VALUE NOTED.  VALUE IS CONSISTENT WITH PREVIOUSLY REPORTED AND CALLED VALUE.  Magnesium     Status:  None   Collection Time: 08/16/15  4:22 AM  Result Value Ref Range   Magnesium 1.7 1.7 - 2.4 mg/dL  Phosphorus     Status: None   Collection Time: 08/16/15  4:22 AM  Result Value Ref Range   Phosphorus 3.4 2.5 - 4.6 mg/dL  Blood gas, arterial     Status: Abnormal   Collection Time: 08/16/15  5:21 AM  Result Value Ref Range   FIO2 0.28    Delivery systems TRACH COLLAR/TRACH TUBE    pH, Arterial 7.46 (H) 7.350 - 7.450   pCO2 arterial 44 32.0 - 48.0 mmHg   pO2, Arterial 92 83.0 - 108.0 mmHg   Bicarbonate 31.3 (H) 21.0 - 28.0 mEq/L   Acid-Base Excess 6.6 (H) 0.0 - 3.0 mmol/L   O2 Saturation  97.6 %   Patient temperature 37.0    Collection site RIGHT RADIAL    Sample type ARTERIAL DRAW    Allens test (pass/fail) POSITIVE (A) PASS  Glucose, capillary     Status: Abnormal   Collection Time: 08/16/15  7:10 AM  Result Value Ref Range   Glucose-Capillary 249 (H) 65 - 99 mg/dL  Glucose, capillary     Status: Abnormal   Collection Time: 08/16/15 11:27 AM  Result Value Ref Range   Glucose-Capillary 135 (H) 65 - 99 mg/dL  APTT     Status: Abnormal   Collection Time: 08/16/15  1:23 PM  Result Value Ref Range   aPTT 82 (H) 24 - 36 seconds    Comment:        IF BASELINE aPTT IS ELEVATED, SUGGEST PATIENT RISK ASSESSMENT BE USED TO DETERMINE APPROPRIATE ANTICOAGULANT THERAPY.   Protime-INR     Status: Abnormal   Collection Time: 08/16/15  1:23 PM  Result Value Ref Range   Prothrombin Time 18.1 (H) 11.4 - 15.0 seconds   INR 1.48    No components found for: ESR, C REACTIVE PROTEIN MICRO: Recent Results (from the past 720 hour(s))  Culture, blood (routine x 2)     Status: None   Collection Time: 08/01/15  7:00 PM  Result Value Ref Range Status   Specimen Description BLOOD RIGHT ANTECUBITAL  Final   Special Requests BOTTLES DRAWN AEROBIC ONLY 5CC  Final   Culture NO GROWTH 5 DAYS  Final   Report Status 08/06/2015 FINAL  Final  Culture, blood (routine x 2)     Status: None   Collection Time: 08/01/15  7:10 PM  Result Value Ref Range Status   Specimen Description BLOOD RIGHT HAND  Final   Special Requests IN PEDIATRIC BOTTLE 3CC  Final   Culture  Setup Time   Final    GRAM POSITIVE COCCI IN CLUSTERS AEROBIC BOTTLE ONLY CRITICAL RESULT CALLED TO, READ BACK BY AND VERIFIED WITH: D TAYLOR,RN AT 1115 08/02/15 BY L BENFIELD    Culture STAPHYLOCOCCUS SPECIES (COAGULASE NEGATIVE)  Final   Report Status 08/06/2015 FINAL  Final   Organism ID, Bacteria STAPHYLOCOCCUS SPECIES (COAGULASE NEGATIVE)  Final      Susceptibility   Staphylococcus species (coagulase negative) - MIC*     CIPROFLOXACIN >=8 RESISTANT Resistant     ERYTHROMYCIN <=0.25 SENSITIVE Sensitive     GENTAMICIN 8 INTERMEDIATE Intermediate     OXACILLIN >=4 RESISTANT Resistant     TETRACYCLINE <=1 SENSITIVE Sensitive     VANCOMYCIN 1 SENSITIVE Sensitive     TRIMETH/SULFA 160 RESISTANT Resistant     CLINDAMYCIN <=0.25 SENSITIVE Sensitive     RIFAMPIN <=0.5  SENSITIVE Sensitive     Inducible Clindamycin NEGATIVE Sensitive     * STAPHYLOCOCCUS SPECIES (COAGULASE NEGATIVE)  Culture, blood (routine x 2)     Status: None   Collection Time: 08/02/15  7:00 PM  Result Value Ref Range Status   Specimen Description BLOOD RIGHT ANTECUBITAL  Final   Special Requests BOTTLES DRAWN AEROBIC ONLY 10CC  Final   Culture  Setup Time   Final    GRAM POSITIVE COCCI IN CLUSTERS AEROBIC BOTTLE ONLY CRITICAL RESULT CALLED TO, READ BACK BY AND VERIFIED WITH: DR Eden Lathe RN [id number] 08/03/15 A BROWNING    Culture   Final    STAPHYLOCOCCUS SPECIES (COAGULASE NEGATIVE) SUSCEPTIBILITIES PERFORMED ON PREVIOUS CULTURE WITHIN THE LAST 5 DAYS.    Report Status 08/06/2015 FINAL  Final  Culture, blood (routine x 2)     Status: None   Collection Time: 08/02/15  7:05 PM  Result Value Ref Range Status   Specimen Description BLOOD RIGHT HAND  Final   Special Requests BOTTLES DRAWN AEROBIC AND ANAEROBIC 10CC  Final   Culture NO GROWTH 5 DAYS  Final   Report Status 08/07/2015 FINAL  Final  Blood Culture (routine x 2)     Status: None (Preliminary result)   Collection Time: 08/13/2015  8:51 AM  Result Value Ref Range Status   Specimen Description BLOOD UNKO  Final   Special Requests BOTTLES DRAWN AEROBIC AND ANAEROBIC  3CC  Final   Culture NO GROWTH 4 DAYS  Final   Report Status PENDING  Incomplete  Blood Culture (routine x 2)     Status: None (Preliminary result)   Collection Time: 08/16/2015  9:20 AM  Result Value Ref Range Status   Specimen Description BLOOD LEFT ARM  Final   Special Requests BOTTLES DRAWN AEROBIC AND ANAEROBIC  1CC   Final   Culture NO GROWTH 4 DAYS  Final   Report Status PENDING  Incomplete  Wound culture     Status: None   Collection Time: 07/25/2015  9:20 AM  Result Value Ref Range Status   Specimen Description DECUBITIS  Final   Special Requests Normal  Final   Gram Stain   Final    FEW WBC SEEN MANY GRAM NEGATIVE RODS RARE GRAM POSITIVE COCCI    Culture   Final    HEAVY GROWTH ESCHERICHIA COLI MODERATE GROWTH ENTEROBACTER AEROGENES PROTEUS MIRABILIS HEAVY GROWTH ENTEROCOCCUS SPECIES VRE HAVE INTRINSIC RESISTANCE TO MOST COMMONLY USED ANTIBIOTICS AND THE ABILITY TO ACQUIRE RESISTANCE TO MOST AVAILABLE ANTIBIOTICS.    Report Status 08/16/2015 FINAL  Final   Organism ID, Bacteria ESCHERICHIA COLI  Final   Organism ID, Bacteria ENTEROBACTER AEROGENES  Final   Organism ID, Bacteria PROTEUS MIRABILIS  Final   Organism ID, Bacteria ENTEROCOCCUS SPECIES  Final      Susceptibility   Enterobacter aerogenes - MIC*    CEFTAZIDIME <=1 SENSITIVE Sensitive     CEFAZOLIN >=64 RESISTANT Resistant     CEFTRIAXONE <=1 SENSITIVE Sensitive     CIPROFLOXACIN <=0.25 SENSITIVE Sensitive     GENTAMICIN <=1 SENSITIVE Sensitive     IMIPENEM 1 SENSITIVE Sensitive     TRIMETH/SULFA <=20 SENSITIVE Sensitive     * MODERATE GROWTH ENTEROBACTER AEROGENES   Escherichia coli - MIC*    AMPICILLIN <=2 SENSITIVE Sensitive     CEFTAZIDIME <=1 SENSITIVE Sensitive     CEFAZOLIN <=4 SENSITIVE Sensitive     CEFTRIAXONE <=1 SENSITIVE Sensitive     CIPROFLOXACIN <=0.25 SENSITIVE Sensitive  GENTAMICIN <=1 SENSITIVE Sensitive     IMIPENEM <=0.25 SENSITIVE Sensitive     TRIMETH/SULFA <=20 SENSITIVE Sensitive     * HEAVY GROWTH ESCHERICHIA COLI   Proteus mirabilis - MIC*    AMPICILLIN >=32 RESISTANT Resistant     CEFTAZIDIME <=1 SENSITIVE Sensitive     CEFAZOLIN 8 SENSITIVE Sensitive     CEFTRIAXONE <=1 SENSITIVE Sensitive     CIPROFLOXACIN <=0.25 SENSITIVE Sensitive     GENTAMICIN <=1 SENSITIVE Sensitive     IMIPENEM 1  SENSITIVE Sensitive     TRIMETH/SULFA <=20 SENSITIVE Sensitive     * PROTEUS MIRABILIS   Enterococcus species - MIC*    AMPICILLIN >=32 RESISTANT Resistant     VANCOMYCIN >=32 RESISTANT Resistant     GENTAMICIN SYNERGY SENSITIVE Sensitive     TETRACYCLINE Value in next row Resistant      RESISTANT>=16    * HEAVY GROWTH ENTEROCOCCUS SPECIES  MRSA PCR Screening     Status: None   Collection Time: 07/30/2015  2:38 PM  Result Value Ref Range Status   MRSA by PCR NEGATIVE NEGATIVE Final    Comment:        The GeneXpert MRSA Assay (FDA approved for NASAL specimens only), is one component of a comprehensive MRSA colonization surveillance program. It is not intended to diagnose MRSA infection nor to guide or monitor treatment for MRSA infections.   Blood culture (single)     Status: None (Preliminary result)   Collection Time: 08/11/2015  3:36 PM  Result Value Ref Range Status   Specimen Description BLOOD RIGHT ASSIST CONTROL  Final   Special Requests BOTTLES DRAWN AEROBIC AND ANAEROBIC 5ML  Final   Culture NO GROWTH 4 DAYS  Final   Report Status PENDING  Incomplete  Wound culture     Status: None (Preliminary result)   Collection Time: 08/13/15 12:37 PM  Result Value Ref Range Status   Specimen Description WOUND  Final   Special Requests Normal  Final   Gram Stain   Final    FEW WBC SEEN TOO NUMEROUS TO COUNT GRAM NEGATIVE RODS FEW GRAM POSITIVE COCCI    Culture   Final    HEAVY GROWTH ESCHERICHIA COLI MODERATE GROWTH PROTEUS MIRABILIS LIGHT GROWTH KLEBSIELLA PNEUMONIAE MODERATE GROWTH ENTEROCOCCUS GALLINARUM CRITICAL RESULT CALLED TO, READ BACK BY AND VERIFIED WITH: [facility] BORBA AT 6045 08/16/15 DV    Report Status PENDING  Incomplete   Organism ID, Bacteria ESCHERICHIA COLI  Final   Organism ID, Bacteria PROTEUS MIRABILIS  Final   Organism ID, Bacteria KLEBSIELLA PNEUMONIAE  Final   Organism ID, Bacteria ENTEROCOCCUS GALLINARUM  Final      Susceptibility   Escherichia  coli - MIC*    AMPICILLIN >=32 RESISTANT Resistant     CEFTAZIDIME 4 RESISTANT Resistant     CEFAZOLIN >=64 RESISTANT Resistant     CEFTRIAXONE 16 RESISTANT Resistant     GENTAMICIN 2 SENSITIVE Sensitive     IMIPENEM >=16 RESISTANT Resistant     TRIMETH/SULFA <=20 SENSITIVE Sensitive     Extended ESBL POSITIVE Resistant     PIP/TAZO Value in next row Resistant      RESISTANT>=128    CIPROFLOXACIN Value in next row Sensitive      SENSITIVE<=0.25    * HEAVY GROWTH ESCHERICHIA COLI   Klebsiella pneumoniae - MIC*    AMPICILLIN Value in next row Resistant      SENSITIVE<=0.25    CEFTAZIDIME Value in next row Resistant  SENSITIVE<=0.25    CEFAZOLIN Value in next row Resistant      SENSITIVE<=0.25    CEFTRIAXONE Value in next row Resistant      SENSITIVE<=0.25    CIPROFLOXACIN Value in next row Resistant      SENSITIVE<=0.25    GENTAMICIN Value in next row Sensitive      SENSITIVE<=0.25    IMIPENEM Value in next row Resistant      SENSITIVE<=0.25    TRIMETH/SULFA Value in next row Resistant      SENSITIVE<=0.25    PIP/TAZO Value in next row Resistant      RESISTANT>=128    * LIGHT GROWTH KLEBSIELLA PNEUMONIAE   Proteus mirabilis - MIC*    AMPICILLIN Value in next row Resistant      RESISTANT>=128    CEFTAZIDIME Value in next row Sensitive      RESISTANT>=128    CEFAZOLIN Value in next row Sensitive      RESISTANT>=128    CEFTRIAXONE Value in next row Sensitive      RESISTANT>=128    CIPROFLOXACIN Value in next row Sensitive      RESISTANT>=128    GENTAMICIN Value in next row Sensitive      RESISTANT>=128    IMIPENEM Value in next row Sensitive      RESISTANT>=128    TRIMETH/SULFA Value in next row Sensitive      RESISTANT>=128    PIP/TAZO Value in next row Sensitive      SENSITIVE<=4    * MODERATE GROWTH PROTEUS MIRABILIS   Enterococcus gallinarum - MIC*    AMPICILLIN Value in next row Resistant      SENSITIVE<=4    GENTAMICIN SYNERGY Value in next row Sensitive       SENSITIVE<=4    CIPROFLOXACIN Value in next row Resistant      RESISTANT>=8    TETRACYCLINE Value in next row Resistant      RESISTANT>=16    * MODERATE GROWTH ENTEROCOCCUS GALLINARUM  Wound culture     Status: None (Preliminary result)   Collection Time: 08/15/15  2:53 PM  Result Value Ref Range Status   Specimen Description WOUND  Final   Special Requests NONE  Final   Gram Stain PENDING  Incomplete   Culture NO GROWTH < 24 HOURS  Final   Report Status PENDING  Incomplete    IMAGING: Dg Chest 1 View  08/11/2015   CLINICAL DATA:  Hypotension. Unresponsive patient with tracheostomy in place.  EXAM: CHEST 1 VIEW  COMPARISON:  08/02/2015  FINDINGS: Cardiac silhouette is top-normal in size. No mediastinal or hilar masses or evidence of adenopathy. Lungs are clear. No pleural effusion or pneumothorax.  Right internal jugular dual-lumen central venous catheter is stable with its distal tip in the right atrium. Left internal jugular small caliber dual-lumen central venous catheter is stable with its tip in the mid superior vena cava.  Tracheostomy tube is well positioned and also stable.  IMPRESSION: 1. Tracheostomy tube and central venous lines are stable as detailed above. 2. No acute cardiopulmonary disease.   Electronically Signed   By: Lajean Manes M.D.   On: 08/09/2015 09:24   Dg Abd 1 View  08/15/2015   CLINICAL DATA:  OG tube placement  EXAM: ABDOMEN - 1 VIEW  COMPARISON:  06/15/2015  FINDINGS: OG tube tip is in the distal stomach. Nonobstructive bowel gas pattern. Linear high density areas throughout the abdomen, likely peritoneal calcifications. No visible free air.  IMPRESSION: OG tube tip in  the distal stomach.   Electronically Signed   By: Rolm Baptise M.D.   On: 08/15/2015 10:12   Ct Head Wo Contrast  07/30/2015   CLINICAL DATA:  Unresponsive patient.  Leftward gaze.  EXAM: CT HEAD WITHOUT CONTRAST  TECHNIQUE: Contiguous axial images were obtained from the base of the skull  through the vertex without intravenous contrast.  COMPARISON:  MRI 01/28/2012.  FINDINGS: No mass lesion, mass effect, midline shift, hydrocephalus, hemorrhage. No territorial ischemia or acute infarction. Unchanged small dilated perivascular spaces are noted in the basal ganglia.  IMPRESSION: Negative CT head.   Electronically Signed   By: Dereck Ligas M.D.   On: 08/03/2015 10:11   Dg Chest Port 1 View  08/13/2015   CLINICAL DATA:  Respiratory failure  EXAM: PORTABLE CHEST 1 VIEW  COMPARISON:  08/09/2015  FINDINGS: Support devices are unchanged. Cardiomegaly with vascular congestion. Left lower lobe atelectasis or infiltrate has increased. Suspect small left effusion. No confluent opacity on the right.  IMPRESSION: Increasing left lower lobe atelectasis or infiltrate with small left effusion.   Electronically Signed   By: Rolm Baptise M.D.   On: 08/13/2015 09:31   Dg Chest Port 1 View  08/02/2015   CLINICAL DATA:  Respiratory failure.  EXAM: PORTABLE CHEST - 1 VIEW  COMPARISON:  07/17/2015.  FINDINGS: Tracheostomy tube, left IJ line, dual-lumen right IJ line in stable position. Mediastinum and hilar structures are stable. Low lung volumes with mild bibasilar atelectasis. Stable cardiomegaly. No pulmonary venous congestion. No pleural effusion or pneumothorax. Abdominal calcifications are again noted. No acute bony abnormality.  IMPRESSION: 1. Lines and tubes in stable position. 2. Low lung volumes with mild bibasilar subsegmental atelectasis. 3. Stable cardiomegaly.  No pulmonary venous congestion.   Electronically Signed   By: Marcello Moores  Register   On: 08/02/2015 08:01    Assessment:   Courtney [name] is a 49 y.o. female with sepsis from severe sacral decubitus.  Wound culture is growing MDRO organisms including carbapenem resistant E coli, VRE as well as Proteus and Klebsiella.  She has had bedside debridement and is getting wet to dry changes. She has been on vanco and meropenem and wbc has  decreased 30->9, she has weaned off pressors and is somewhat more alert. ESR is 72.  She is on HD.  Her wound is quite severe and I do not see any possible chance for improvement in this situation with her other illnesses and bedbound state. Her albumin is only 1.6, T prot 4.4 which will make wound healing almost impossible.  I would not tailor antibiotic at this point to the organisms isolated as some are likely to be colonization. She has clinically responded to meropenem and vancomycin (although there is VRE).  Recommendations Would continue meropenem - would dc gent at this point since clinically improving.  Change vanco to daptomycin - renal dosing Will need aggressive wound care.  I spoke with her husband and updated him on the antibiotic selection.     Thank you very much for allowing me to participate in the care of this patient. Please call with questions.   Cheral Marker. Ola Spurr, MD

## 2015-08-16 NOTE — Progress Notes (Signed)
No exam performed today. Patient remains essentially unchanged and obtunded. Long discussion with RN concerning patient's condition and care. RN reports that wound is much improved over previous reports. I would consider placing a wound VAC once patient's condition improves but currently local wound care with wet-to-dry dressings is certainly appropriate and adequate. As mentioned with RN I could easily speak to the family concerning this patient's care however I would defer most of the questions and answers in discussion to the primary physician as her condition is quite complex and at this point surgery is dealing with her decubitus only.

## 2015-08-16 NOTE — Progress Notes (Signed)
POST HD   08/16/15 1300  Neurological  Level of Consciousness Responds to Pain  Orientation Level Intubated/Tracheostomy - Unable to assess  Respiratory  Respiratory Pattern Regular;Unlabored  Chest Assessment Chest expansion symmetrical  Bilateral Breath Sounds Fine crackles  Tracheostomy Shiley 6 mm Uncuffed  No Placement Date or Time found.   Inserted prior to hospital arrival?: Yes  Brand: Shiley  Size (mm): 6 mm  Style: Uncuffed  Status Secured  Site Assessment Clean;Dry  Ties Assessment Clean;Dry  Emergency Equipment at bedside Yes  Cardiac  Pulse Regular  ECG Monitor Yes  Cardiac Rhythm NSR  Vascular  Edema Generalized;Left lower extremity;Right lower extremity  Generalized Edema +1  RLE Edema +2  LLE Edema +2  Integumentary  Integumentary (WDL) X  Skin Color Appropriate for ethnicity  Skin Condition Dry  Skin Integrity Rash;MSAD  Rash Location Groin  Musculoskeletal  Musculoskeletal (WDL) X  Generalized Weakness Yes  Gastrointestinal  Bowel Sounds Assessment Active  NG/OG Tube Other (Comment) Right nare  Placement Date/Time: 08/15/15 1000   Person Inserting Catheter: Cleda Clarks RN///////Dobbhoff  Tube Type: Other (Comment)  Tube Location: Right nare  Site Assessment Clean;Dry;Intact  Status Infusing tube feed  GU Assessment  Genitourinary (WDL) X  Genitourinary Symptoms Other (Comment) (HD)  Psychosocial  Psychosocial (WDL) X  Patient Behaviors Not interactive;Withdrawn  Emotional support given Given to patient

## 2015-08-16 NOTE — Progress Notes (Signed)
PULMONARY / CRITICAL CARE MEDICINE   Name: Courtney [name] MRN: [id number] DOB: 1966/02/05    ADMISSION DATE:  07/31/2015 CONSULTATION DATE: 9/23  INITIAL PRESENTATION:  37 F who has been in medical facilities (hosp, LTAC, rehab) for 2 yrs following gastric bypass surgery with multiple complications. Now with chronic trach, ESRD, profound debilitation, severe sacral pressure ulcer. Was seemingly making progress and transferred to rehab facility approx one week prior to this admission. She was sent to [facility] ED with AMS and hypotension. Working dx of severe sepsis/septic shock due to infected sacral pressure ulcer. Also has R side externalized HD cath and tunneled L IJ CVL. She has history of resistant bacterial infections. She is on chronic steroids. Her husband indicates that she has been diagnosed with adrenal insuff at some point during the past 2 yrs  MAJOR EVENTS/TEST RESULTS: 9/23 CT head: NAD 9/23 EEG: no epileptiform activity 9/23 PRBCs for Hgb 6.4 9/24 bedside debridement of sacral wound. Abscess drained 9/25 Off vasopressors. More alert. No distress. Worsening thrombocytopenia. Vanc DC'd  INDWELLING DEVICES:: Trach (chronic)  Tunneled R IJ HD cath (chronic) Tunneled L IJ CVL (chronic) L femoral A-line 9/23 >> 9/25  MICRO DATA: MRSA PCR 9/23 >> NEG Urine 9/23 >>  Wound (swab) 9/23 >> many GNR, rare GPC >>  Blood 9/23 >>  Wound (debridement) 9/24 >> heavy GNR >>  CDiff 9/27>>   ANTIMICROBIALS:  Aztreonam 9/23 >> 9/24 Vanc 9/23 >> 9/25 Vanc 9/26>> Meropenem 9/23 >>    SUBJECTIVE:  RASS 0, -1.  Nothing purposeful at this time, off pressors, BP stable. On Trach collar at 28%, mild thin secretions.  Wound Cx with GNR, elink broaden abx with addition of vanc. Plt count still dropping.  Updated husband on status  VITAL SIGNS: Temp:  [97.9 F (36.6 C)-98.8 F (37.1 C)] 98.8 F (37.1 C) (09/27 0900) Pulse Rate:  [85-112] 94 (09/27 0945) Resp:  [10-30] 15  (09/27 0945) BP: (106-145)/(39-82) 106/61 mmHg (09/27 0945) SpO2:  [96 %-100 %] 100 % (09/27 0900) FiO2 (%):  [28 %] 28 % (09/27 0510) Weight:  [184 lb 1.4 oz (83.5 kg)] 184 lb 1.4 oz (83.5 kg) (09/27 0900) HEMODYNAMICS:   VENTILATOR SETTINGS: Vent Mode:  [-]  FiO2 (%):  [28 %] 28 % INTAKE / OUTPUT:  Intake/Output Summary (Last 24 hours) at 08/16/15 0955 Last data filed at 08/16/15 0903  Gross per 24 hour  Intake 753.83 ml  Output      0 ml  Net 753.83 ml    PHYSICAL EXAMINATION: General: RASS 0, -1, + F/C, no respiratory distress Neuro: CNs intact, MAEs, DTRs symmetric HEENT: Cushingoid facies Neck: no JVD noted, trach site clean Cardiovascular: Regular, no M noted Lungs: No adventitious sounds Abdomen: Obese, soft, NT, diminished BS Ext: cool, diminished distal pulses, no edema Skin: very deep, malodorous sacral pressure ulcer  LABS:  CBC  Recent Labs Lab 08/14/15 1747 08/15/15 0530 08/16/15 0422  WBC 14.8* 14.9* 9.4  HGB 6.8* 9.1* 8.5*  HCT 21.4* 27.8* 26.3*  PLT 43* 21* 15*   Coag's No results for input(s): APTT, INR in the last 168 hours. BMET  Recent Labs Lab 08/14/15 0510 08/15/15 0530 08/16/15 0422  NA 136 141 142  K 4.2 4.0 3.8  CL 102 104 105  CO2 28 32 31  BUN 27* 17 25*  CREATININE 1.80* 1.11* 1.45*  GLUCOSE 95 76 234*   Electrolytes  Recent Labs Lab 08/08/2015 0850 08/13/15 0250 08/14/15 0510 08/14/15 1203 08/15/15  0530 08/16/15 0422  CALCIUM 7.1* 7.5* 7.5*  --  7.8* 7.6*  MG 1.4* 1.8  --   --   --  1.7  PHOS  --   --   --  1.6* 1.6* 3.4   Sepsis Markers  Recent Labs Lab 07/31/2015 0853 08/03/2015 1605  LATICACIDVEN 1.2 1.5   ABG  Recent Labs Lab 08/11/2015 1721 08/15/15 0850 08/16/15 0521  PHART 7.48* 7.47* 7.46*  PCO2ART 42 44 44  PO2ART 121* 65* 92   Liver Enzymes  Recent Labs Lab 08/06/2015 0850  AST 16  ALT 13*  ALKPHOS 175*  BILITOT 0.9  ALBUMIN 1.6*   Cardiac Enzymes  Recent Labs Lab 08/07/2015 0850   TROPONINI 0.22*   Glucose  Recent Labs Lab 08/14/15 2213 08/15/15 0724 08/15/15 1153 08/15/15 1600 08/15/15 2111 08/16/15 0710  GLUCAP 75 73 71 72 123* 249*    CXR: NNF    ASSESSMENT / PLAN:  PULMONARY A: 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 HD 9/25  GASTROINTESTINAL A:  Loose stool - back on TF  Hx of C. diff P:   SUP: N/I unless requires vent support Begin D III diet with supervision 9/25 Check c.diff PCR  HEMATOLOGIC A:   Acute on chronic anemia - unclear if acute blood loss Thrombocytopenia, worsening. Etiology unclear. Was on heparin PTA P:  DVT px: SCDs Monitor CBC intermittently Transfuse per usual guidelines Check HIT panel 9/25 Might need platelet transfusion prior to next debridement  INFECTIOUS A:   Severe sepsis Sacral decubitus ulcer with abscess P:   Monitor temp, WBC count Micro and abx as above Surgery following  ENDOCRINE A:  DM 2, controlled Chronic prednisone therapy - chronic dose 10 mg BID Secondary adrenal insuff P:   CBGs and SSI - mod scale Stress dose steroids - wean as able based on BP  NEUROLOGIC A:   Acute encephalopathy, resolving-somnolent still P:   RASS goal: 0 Minimize sedating meds - baseline mentation is talking and following commands.   FAMILY:  - updated husband on current status - husband requested that when patient is more stable that she be transferred back to [facility]  I have personally  obtained a history, examined the patient, evaluated laboratory and imaging results, formulated the assessment and plan and placed orders. CRITICAL CARE: The patient is critically ill with multiple organ systems failure and requires high complexity decision making for assessment and support, frequent evaluation and titration of therapies, application of advanced monitoring technologies and extensive interpretation of multiple databases. Critical Care Time devoted to patient care services described in this note is 40 minutes.    Stephanie Acre, MD [facility] Pager [phone] (please enter 7-digits) On Call Pager - [phone] (please enter 7-digits)      08/16/2015, 9:55 AM

## 2015-08-16 NOTE — Consult Note (Signed)
ANTIBIOTIC CONSULT NOTE - FOLLOW UP  Pharmacy Consult for vancomycin Indication: wound infection  Allergies  Allergen Reactions  . Contrast Media [Iodinated Diagnostic Agents] Anaphylaxis  . Ampicillin Rash    Patient Measurements: Height:  (175.3 cm) Weight: 168 lb 6.9 oz (76.4 kg) IBW/kg (Calculated) : 66.2 Adjusted Body Weight:   Vital Signs: Temp: 98.2 F (36.8 C) (09/26 2300) Temp Source: Axillary (09/26 2300) BP: 138/54 mmHg (09/27 0000) Pulse Rate: 95 (09/27 0000) Intake/Output from previous day: 09/26 0701 - 09/27 0700 In: 500.8 [NG/GT:450.8; IV Piggyback:50] Out: -  Intake/Output from this shift: Total I/O In: 300 [NG/GT:250; IV Piggyback:50] Out: -   Labs:  Recent Labs  08/13/15 0250 08/14/15 0510 08/14/15 1747 08/15/15 0530  WBC 30.3* 21.0* 14.8* 14.9*  HGB 8.7* 7.9* 6.8* 9.1*  PLT 46* 18* 43* 21*  CREATININE 1.41* 1.80*  --  1.11*   Estimated Creatinine Clearance: 63.4 mL/min (by C-G formula based on Cr of 1.11). No results for input(s): VANCOTROUGH, VANCOPEAK, VANCORANDOM, GENTTROUGH, GENTPEAK, GENTRANDOM, TOBRATROUGH, TOBRAPEAK, TOBRARND, AMIKACINPEAK, AMIKACINTROU, AMIKACIN in the last 72 hours.   Microbiology: Recent Results (from the past 720 hour(s))  Culture, blood (routine x 2)     Status: None   Collection Time: 08/01/15  7:00 PM  Result Value Ref Range Status   Specimen Description BLOOD RIGHT ANTECUBITAL  Final   Special Requests BOTTLES DRAWN AEROBIC ONLY 5CC  Final   Culture NO GROWTH 5 DAYS  Final   Report Status 08/06/2015 FINAL  Final  Culture, blood (routine x 2)     Status: None   Collection Time: 08/01/15  7:10 PM  Result Value Ref Range Status   Specimen Description BLOOD RIGHT HAND  Final   Special Requests IN PEDIATRIC BOTTLE 3CC  Final   Culture  Setup Time   Final    GRAM POSITIVE COCCI IN CLUSTERS AEROBIC BOTTLE ONLY CRITICAL RESULT CALLED TO, READ BACK BY AND VERIFIED WITH: D TAYLOR,RN AT 1115 08/02/15 BY L  BENFIELD    Culture STAPHYLOCOCCUS SPECIES (COAGULASE NEGATIVE)  Final   Report Status 08/06/2015 FINAL  Final   Organism ID, Bacteria STAPHYLOCOCCUS SPECIES (COAGULASE NEGATIVE)  Final      Susceptibility   Staphylococcus species (coagulase negative) - MIC*    CIPROFLOXACIN >=8 RESISTANT Resistant     ERYTHROMYCIN <=0.25 SENSITIVE Sensitive     GENTAMICIN 8 INTERMEDIATE Intermediate     OXACILLIN >=4 RESISTANT Resistant     TETRACYCLINE <=1 SENSITIVE Sensitive     VANCOMYCIN 1 SENSITIVE Sensitive     TRIMETH/SULFA 160 RESISTANT Resistant     CLINDAMYCIN <=0.25 SENSITIVE Sensitive     RIFAMPIN <=0.5 SENSITIVE Sensitive     Inducible Clindamycin NEGATIVE Sensitive     * STAPHYLOCOCCUS SPECIES (COAGULASE NEGATIVE)  Culture, blood (routine x 2)     Status: None   Collection Time: 08/02/15  7:00 PM  Result Value Ref Range Status   Specimen Description BLOOD RIGHT ANTECUBITAL  Final   Special Requests BOTTLES DRAWN AEROBIC ONLY 10CC  Final   Culture  Setup Time   Final    GRAM POSITIVE COCCI IN CLUSTERS AEROBIC BOTTLE ONLY CRITICAL RESULT CALLED TO, READ BACK BY AND VERIFIED WITH: DR Gwenevere Abbot RN 1549 08/03/15 A BROWNING    Culture   Final    STAPHYLOCOCCUS SPECIES (COAGULASE NEGATIVE) SUSCEPTIBILITIES PERFORMED ON PREVIOUS CULTURE WITHIN THE LAST 5 DAYS.    Report Status 08/06/2015 FINAL  Final  Culture, blood (routine x 2)  Status: None   Collection Time: 08/02/15  7:05 PM  Result Value Ref Range Status   Specimen Description BLOOD RIGHT HAND  Final   Special Requests BOTTLES DRAWN AEROBIC AND ANAEROBIC 10CC  Final   Culture NO GROWTH 5 DAYS  Final   Report Status 08/07/2015 FINAL  Final  Blood Culture (routine x 2)     Status: None (Preliminary result)   Collection Time: 2015-09-01  8:51 AM  Result Value Ref Range Status   Specimen Description BLOOD UNKO  Final   Special Requests BOTTLES DRAWN AEROBIC AND ANAEROBIC  3CC  Final   Culture NO GROWTH 3 DAYS  Final   Report Status  PENDING  Incomplete  Blood Culture (routine x 2)     Status: None (Preliminary result)   Collection Time: Sep 01, 2015  9:20 AM  Result Value Ref Range Status   Specimen Description BLOOD LEFT ARM  Final   Special Requests BOTTLES DRAWN AEROBIC AND ANAEROBIC  1CC  Final   Culture NO GROWTH 3 DAYS  Final   Report Status PENDING  Incomplete  Wound culture     Status: None (Preliminary result)   Collection Time: 09-01-2015  9:20 AM  Result Value Ref Range Status   Specimen Description DECUBITIS  Final   Special Requests Normal  Final   Gram Stain   Final    FEW WBC SEEN MANY GRAM NEGATIVE RODS RARE GRAM POSITIVE COCCI    Culture   Final    HEAVY GROWTH ESCHERICHIA COLI MODERATE GROWTH ENTEROBACTER AEROGENES PROTEUS MIRABILIS HEAVY GROWTH ENTEROCOCCUS FAECIUM REPEATING SENISITIVITIES FOR VRE    Report Status PENDING  Incomplete   Organism ID, Bacteria ESCHERICHIA COLI  Final   Organism ID, Bacteria ENTEROBACTER AEROGENES  Final   Organism ID, Bacteria PROTEUS MIRABILIS  Final   Organism ID, Bacteria ENTEROCOCCUS FAECIUM  Final      Susceptibility   Enterobacter aerogenes - MIC*    CEFTAZIDIME <=1 SENSITIVE Sensitive     CEFAZOLIN >=64 RESISTANT Resistant     CEFTRIAXONE <=1 SENSITIVE Sensitive     CIPROFLOXACIN <=0.25 SENSITIVE Sensitive     GENTAMICIN <=1 SENSITIVE Sensitive     IMIPENEM 1 SENSITIVE Sensitive     TRIMETH/SULFA <=20 SENSITIVE Sensitive     * MODERATE GROWTH ENTEROBACTER AEROGENES   Escherichia coli - MIC*    AMPICILLIN <=2 SENSITIVE Sensitive     CEFTAZIDIME <=1 SENSITIVE Sensitive     CEFAZOLIN <=4 SENSITIVE Sensitive     CEFTRIAXONE <=1 SENSITIVE Sensitive     CIPROFLOXACIN <=0.25 SENSITIVE Sensitive     GENTAMICIN <=1 SENSITIVE Sensitive     IMIPENEM <=0.25 SENSITIVE Sensitive     TRIMETH/SULFA <=20 SENSITIVE Sensitive     * HEAVY GROWTH ESCHERICHIA COLI   Proteus mirabilis - MIC*    AMPICILLIN >=32 RESISTANT Resistant     CEFTAZIDIME <=1 SENSITIVE  Sensitive     CEFAZOLIN 8 SENSITIVE Sensitive     CEFTRIAXONE <=1 SENSITIVE Sensitive     CIPROFLOXACIN <=0.25 SENSITIVE Sensitive     GENTAMICIN <=1 SENSITIVE Sensitive     IMIPENEM 1 SENSITIVE Sensitive     TRIMETH/SULFA <=20 SENSITIVE Sensitive     * PROTEUS MIRABILIS  MRSA PCR Screening     Status: None   Collection Time: 09/01/15  2:38 PM  Result Value Ref Range Status   MRSA by PCR NEGATIVE NEGATIVE Final    Comment:        The GeneXpert MRSA Assay (FDA approved for NASAL  specimens only), is one component of a comprehensive MRSA colonization surveillance program. It is not intended to diagnose MRSA infection nor to guide or monitor treatment for MRSA infections.   Blood culture (single)     Status: None (Preliminary result)   Collection Time: 08/07/2015  3:36 PM  Result Value Ref Range Status   Specimen Description BLOOD RIGHT ASSIST CONTROL  Final   Special Requests BOTTLES DRAWN AEROBIC AND ANAEROBIC  Final   Culture NO GROWTH 3 DAYS  Final   Report Status PENDING  Incomplete  Wound culture     Status: None (Preliminary result)   Collection Time: 08/13/15 12:37 PM  Result Value Ref Range Status   Specimen Description WOUND  Final   Special Requests Normal  Final   Gram Stain   Final    FEW WBC SEEN TOO NUMEROUS TO COUNT GRAM NEGATIVE RODS FEW GRAM POSITIVE COCCI    Culture HEAVY GROWTH GRAM NEGATIVE RODS ONCE ISOLATED   Final   Report Status PENDING  Incomplete    Anti-infectives    Start     Dose/Rate Route Frequency Ordered Stop   08/16/15 1200  vancomycin (VANCOCIN) IVPB 750 mg/150 ml premix     750 mg 150 mL/hr over 60 Minutes Intravenous Every T-Th-Sa (Hemodialysis) 08/16/15 0145     08/14/15 1930  meropenem (MERREM) 500 mg in sodium chloride 0.9 % 50 mL IVPB     500 mg 100 mL/hr over 30 Minutes Intravenous Every 24 hours 08/14/15 1042     08/14/15 1045  meropenem (MERREM) 1 g in sodium chloride 0.9 % 100 mL IVPB  Status:  Discontinued     1  g 200 mL/hr over 30 Minutes Intravenous Every 12 hours 08/14/15 1040 08/14/15 1041   08/13/15 1800  aztreonam (AZACTAM) 1 g in dextrose 5 % 50 mL IVPB  Status:  Discontinued     1 g 100 mL/hr over 30 Minutes Intravenous Every 24 hours 07/30/2015 1840 08/13/15 1021   08/13/15 1200  vancomycin (VANCOCIN) IVPB 750 mg/150 ml premix  Status:  Discontinued     750 mg 150 mL/hr over 60 Minutes Intravenous Every T-Th-Sa (Hemodialysis) 07/26/2015 1849 08/14/15 0924   08/13/15 0100  aztreonam (AZACTAM) 2 g in dextrose 5 % 50 mL IVPB  Status:  Discontinued     2 g 100 mL/hr over 30 Minutes Intravenous 3 times per day 08/16/2015 1820 08/15/2015 1822   07/31/2015 2200  cefTAZidime (FORTAZ) 2 g in dextrose 5 % 50 mL IVPB  Status:  Discontinued     2 g 100 mL/hr over 30 Minutes Intravenous 3 times per day 07/29/2015 1051 07/23/2015 1728   08/17/2015 2200  meropenem (MERREM) 1 g in sodium chloride 0.9 % 100 mL IVPB  Status:  Discontinued     1 g 200 mL/hr over 30 Minutes Intravenous 3 times per day 08/08/2015 1812 07/30/2015 1822   08/06/2015 2200  meropenem (MERREM) 500 mg in sodium chloride 0.9 % 50 mL IVPB  Status:  Discontinued     500 mg 100 mL/hr over 30 Minutes Intravenous 3 times per day 08/13/2015 1840 08/08/2015 1903   07/25/2015 1915  meropenem (MERREM) 500 mg in sodium chloride 0.9 % 50 mL IVPB  Status:  Discontinued     500 mg 100 mL/hr over 30 Minutes Intravenous Every 24 hours 08/11/2015 1903 08/14/15 1039   07/23/2015 1900  vancomycin (VANCOCIN) IVPB 750 mg/150 ml premix     750 mg 150 mL/hr over 60  Minutes Intravenous  Once 08-23-2015 1849 August 23, 2015 2000   08/23/15 1830  aztreonam (AZACTAM) injection 2 g  Status:  Discontinued     2 g Intramuscular 3 times per day 2015/08/23 1005 August 23, 2015 1047   08/23/15 1530  vancomycin (VANCOCIN) IVPB 750 mg/150 ml premix  Status:  Discontinued     750 mg 150 mL/hr over 60 Minutes Intravenous Every 12 hours 2015-08-23 1005 August 23, 2015 1849   2015-08-23 1400  aztreonam (AZACTAM) 1 g in  dextrose 5 % 50 mL IVPB  Status:  Discontinued     1 g 100 mL/hr over 30 Minutes Intravenous 3 times per day 23-Aug-2015 1048 23-Aug-2015 1051   August 23, 2015 1130  levofloxacin (LEVAQUIN) IVPB 750 mg  Status:  Discontinued     750 mg 100 mL/hr over 90 Minutes Intravenous Every 24 hours 08-23-15 1128 08-23-2015 1728   2015-08-23 1100  Levofloxacin (LEVAQUIN) IVPB 250 mg  Status:  Discontinued     250 mg 50 mL/hr over 60 Minutes Intravenous Every 24 hours 2015/08/23 1048 08-23-2015 1128   08/23/2015 0900  vancomycin (VANCOCIN) IVPB 1000 mg/200 mL premix     1,000 mg 200 mL/hr over 60 Minutes Intravenous  Once 08/23/15 0849 Aug 23, 2015 1108   08-23-2015 0900  aztreonam (AZACTAM) 2 g in dextrose 5 % 50 mL IVPB     2 g 100 mL/hr over 30 Minutes Intravenous  Once 08-23-2015 0849 August 23, 2015 1108      Assessment: Pt is a 49 year old female in septic shock. Pharmacy was consulted to dose vancomycin. Pt received 1g in the ED today. Pt recieves HD q TTHSat.  Goal of Therapy:  Pre HD level 15-25 post HD level 5-15  Plan:  Pt received 1.75 gm IV x 1 on 9/23 and 750 mg IV on 9/24, no dose on 9/25 or 9/26. Resumed 750 mg IV p HD without load and will check level on 08/20/15.  Carola Frost, Pharm.D. Clinical Pharmacist 08/16/2015,1:49 AM

## 2015-08-16 NOTE — Care Management Note (Signed)
Case Management Note  Patient Details  Name: Courtney [name] MRN: [id number] Date of Birth: 06-Dec-1965  Subjective/Objective:   [facility] unable to accept patient since they will not have a dialysis bed for 2-3 weeks. Requested [facility] evaluate patient.               Action/Plan:   Expected Discharge Date:                  Expected Discharge Plan:  Long Term Acute Care (LTAC)  In-House Referral:     Discharge planning Services  CM Consult  Post Acute Care Choice:    Choice offered to:     DME Arranged:    DME Agency:     HH Arranged:    HH Agency:     Status of Service:  In process, will continue to follow  Medicare Important Message Given:    Date Medicare IM Given:    Medicare IM give by:    Date Additional Medicare IM Given:    Additional Medicare Important Message give by:     If discussed at Long Length of Stay Meetings, dates discussed:    Additional Comments:  [name], RN 08/16/2015, 11:31 AM

## 2015-08-16 NOTE — Progress Notes (Signed)
ELECTROLYTE MANAGEMENT - INITIAL  Pharmacy Consult for Electrolyte Management Indication: hypokalemia  Allergies  Allergen Reactions  . Contrast Media [Iodinated Diagnostic Agents] Anaphylaxis  . Ampicillin Rash    Patient Measurements: Height:  (175.3 cm) Weight: 168 lb 6.9 oz (76.4 kg) IBW/kg (Calculated) : 66.2   Vital Signs: Temp: 98.3 F (36.8 C) (09/27 0400) Temp Source: Axillary (09/27 0400) BP: 138/47 mmHg (09/27 0600) Pulse Rate: 95 (09/27 0600) Intake/Output from previous day: 09/26 0701 - 09/27 0700 In: 800.8 [NG/GT:750.8; IV Piggyback:50] Out: -  Intake/Output from this shift: Total I/O In: 600 [NG/GT:550; IV Piggyback:50] Out: -   Labs:  Recent Labs  08/14/15 1747 08/15/15 0530 08/16/15 0422  WBC 14.8* 14.9* 9.4  HGB 6.8* 9.1* 8.5*  HCT 21.4* 27.8* 26.3*  PLT 43* 21* 15*     Recent Labs  08/14/15 0510 08/14/15 1203 08/15/15 0530 08/16/15 0422  NA 136  --  141 142  K 4.2  --  4.0 3.8  CL 102  --  104 105  CO2 28  --  32 31  GLUCOSE 95  --  76 234*  BUN 27*  --  17 25*  CREATININE 1.80*  --  1.11* 1.45*  CALCIUM 7.5*  --  7.8* 7.6*  MG  --   --   --  1.7  PHOS  --  1.6* 1.6* 3.4   Estimated Creatinine Clearance: 48.5 mL/min (by C-G formula based on Cr of 1.45).    Recent Labs  08/15/15 1153 08/15/15 1600 08/15/15 2111  GLUCAP 71 72 123*    Medical History: Past Medical History  Diagnosis Date  . Obesity   . Dyslipidemia   . Hypertension   . Coronary artery disease     s/p BMS 2010 LAD  . Dysrhythmia     ventricular tachycardia resolved after LAD stent and beta blocker  . Diabetes mellitus     with retinopathy, neuropathy and microalbuminemia  . ESRD (end stage renal disease) on dialysis     Medications:  Scheduled:  . sodium chloride   Intravenous Once  . sodium chloride   Intravenous Once  . feeding supplement (PRO-STAT SUGAR FREE 64)  30 mL Per Tube TID  . free water  100 mL Per Tube 3 times per day  .  hydrocortisone sod succinate (SOLU-CORTEF) inj  50 mg Intravenous Q12H  . insulin aspart  0-15 Units Subcutaneous TID WC  . meropenem (MERREM) IV  500 mg Intravenous Q24H  . sodium chloride  3 mL Intravenous Q12H  . sodium hypochlorite   Irrigation TID  . vancomycin  750 mg Intravenous Q T,Th,Sa-HD    Assessment: 49 yo female admitted for sepsis, found to have hypokalemia. Pharmacy consulted for electrolyte management.  9/24  K = 3.4  Plan:  Will give KCl 40 meq IV.  Will check Potassium level in AM.    9/25 AM K+ 4.2 No supplementary electrolytes ordered. BMP in AM.  9/26. Electrolytes WNL. BMP in AM.  0927 AM potassium WN, no supplement ordered. BMP in AM.  Carola Frost, Pharm.D. Clinical Pharmacist 08/16/2015,6:28 AM

## 2015-08-16 NOTE — Progress Notes (Signed)
HD START   08/16/15 0923  Vital Signs  Pulse Rate 96  Pulse Rate Source Monitor  Resp 19  BP 120/68 mmHg  During Hemodialysis Assessment  Blood Flow Rate (mL/min) 400 mL/min  Arterial Pressure (mmHg) -170 mmHg  Venous Pressure (mmHg) 100 mmHg  Transmembrane Pressure (mmHg) 40 mmHg  Ultrafiltration Rate (mL/min) 430 mL/min  Dialysate Flow Rate (mL/min) 800 ml/min  Conductivity: Machine  13.9  HD Safety Checks Performed Yes  Dialysis Fluid Bolus Normal Saline  Bolus Amount (mL) 250 mL  Intra-Hemodialysis Comments HD TREATMENT INITITATED. GOAL TO REMOVE 1L IN 3.5 HOURS. PT RESPONDS WITH EYE OPENING WHEN MOVED AND CALLED BY NAME. VS STABLE. CVC ACCESSED WITHOUT DIFFICULTY.

## 2015-08-16 NOTE — Progress Notes (Signed)
PRE HD  08/16/15 0900  Report  Report Received From Sandra, rn  Vital Signs  Temp 98.8 F (37.1 C)  Temp Source Axillary  Pulse Rate (!) 112  Pulse Rate Source Monitor  Resp (!) 24  BP 112/74 mmHg  BP Location Right Arm  BP Method Automatic  Patient Position (if appropriate) Lying  Oxygen Therapy  SpO2 100 %  O2 Device Tracheostomy Collar  O2 Flow Rate (L/min) 8 L/min  Pain Assessment  Pain Assessment CPOT  Critical Care Pain Observation Tool (CPOT)  Facial Expression 0  Body Movements 0  Muscle Tension 0  Compliance with ventilator (intubated pts.) 0  Vocalization (extubated pts.) 0  CPOT Total 0  Dialysis Weight  Weight 83.5 kg (184 lb 1.4 oz)  Type of Weight Pre-Dialysis  Time-Out for Hemodialysis  What Procedure? HEMODIALYSIS  Pt Identifiers(min of two) First/Last Name;MRN/Account#  Correct Site? Yes  Correct Side? Yes  Correct Procedure? Yes  Consents Verified? Yes  Rad Studies Available? N/A  Safety Precautions Reviewed? Yes  Machine Checks  Machine Number [id number]  UF/Alarm Test Passed  Conductivity: Meter 13.9  Conductivity: Machine  14.1  pH 7.4  Reverse Osmosis 5705  Dialyzer Lot Number [id number]  Disposable Set Lot Number [id number]  Machine Temperature 98.6 F (37 C)  Immunologist and Audible Yes  Blood Lines Intact and Secured Yes  Pre Treatment Patient Checks  Vascular access used during treatment Catheter  Hepatitis B Surface Antigen Results Negative  Date Hepatitis B Surface Antigen Drawn 08/14/15  Isolation Initiated Yes (CDIFF)  Date Hepatitis B Surface Antibody Drawn 06/15/15  Hemodialysis Consent Verified Yes  Hemodialysis Standing Orders Initiated Yes  ECG (Telemetry) Monitor On Yes  Prime Ordered Normal Saline  Length of  DialysisTreatment -hour(s) 3.5 Hour(s)  Dialysis Treatment Comments GOAL TO REMOVE 1L IN 3.5 HOURS. PT RESPONDS TO MOVEMENT AND VOICE WITH EYE OPENING. CVC ACCESSED WITHOUT DIFFICULTY.  Dialyzer Optiflux 180  NR  Dialysate 2.5 Ca (3K)  Dialysis Anticoagulant None  Dialysate Flow Ordered 800  Blood Flow Rate Ordered 400 mL/min  Ultrafiltration Goal 1 Liters  Pre Treatment Labs Renal panel;CBC;Phosphorus  Dialysis Blood Pressure Support Ordered Normal Saline  Hemodialysis Catheter Double-lumen  No Placement Date or Time found.   Placed prior to admission: Yes  Hemodialysis Catheter Type: Double-lumen  Site Condition No complications  Blue Lumen Status Heparin locked  Red Lumen Status Heparin locked  Purple Lumen Status N/A  Catheter fill solution Heparin 1000 units/ml  Catheter fill volume (Arterial) 2.2 cc  Catheter fill volume (Venous) 2.3  Dressing Type Gauze/Drain sponge  Dressing Status Clean;Dry  Interventions Dressing changed  Drainage Description None  Dressing Change Due 08/18/15

## 2015-08-16 NOTE — Progress Notes (Signed)
RT in room to change H2O bottle for ATC.  Patient remains on 28% ATC, O2 sat 100%.  Tolerating well at this time, will continue to monitor.

## 2015-08-16 NOTE — Progress Notes (Signed)
HD END   08/16/15 1259  Vital Signs  Pulse Rate 89  Resp 20  BP (!) 99/59 mmHg  During Hemodialysis Assessment  Blood Flow Rate (mL/min) 400 mL/min  Arterial Pressure (mmHg) -160 mmHg  Venous Pressure (mmHg) 120 mmHg  Transmembrane Pressure (mmHg) 50 mmHg  Ultrafiltration Rate (mL/min) 560 mL/min  Dialysate Flow Rate (mL/min) 800 ml/min  HD Safety Checks Performed Yes  Dialysis Fluid Bolus Normal Saline  Bolus Amount (mL) 250 mL  Intra-Hemodialysis Comments HD TREATMENT ENDED. PT ASLEEP. BLLOOD RETURNED AND CVC ACCESSED WITHOUT DIFFICULTY.

## 2015-08-16 NOTE — Care Management Note (Signed)
Case Management Note  Patient Details  Name: Rebbie Lauricella MRN: [id number] Date of Birth: Nov 09, 1966  Subjective/Objective:    [facility] will not accept patient due to bed availability. Patient will need to return to [facility]                Action/Plan:   Expected Discharge Date:                  Expected Discharge Plan:  Long Term Acute Care (LTAC)  In-House Referral:     Discharge planning Services  CM Consult  Post Acute Care Choice:    Choice offered to:     DME Arranged:    DME Agency:     HH Arranged:    HH Agency:     Status of Service:  In process, will continue to follow  Medicare Important Message Given:    Date Medicare IM Given:    Medicare IM give by:    Date Additional Medicare IM Given:    Additional Medicare Important Message give by:     If discussed at Long Length of Stay Meetings, dates discussed:    Additional Comments:  [name], RN 08/16/2015, 3:43 PM

## 2015-08-16 NOTE — Progress Notes (Signed)
Nutrition Follow-up    INTERVENTION:   EN: tolerating TF at goal rate, recommend continuing current TF regimen Coordination of Care: discussed nutrition during ICU rounds, also discussed glucose management  NUTRITION DIAGNOSIS:   Inadequate oral intake related to wound healing, acute illness, chronic illness as evidenced by NPO status, estimated needs. Being addressed via TF  GOAL:   Patient will meet greater than or equal to 90% of their needs  MONITOR:    (Energy Intake, Anthropometrics, Digestive System, Electrolyte/Renal Profile, Glucose Profile)  ASSESSMENT:   Pt opens eyes this AM but does not follow commands, pt receiving HD at bedside, tolerating TF via dobhoff tube. 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)  Last BM:  9/26 loose medium   Electrolyte and Renal Profile:  Recent Labs Lab Aug 30, 2015 0850 08/13/15 0250 08/14/15 0510 08/14/15 1203 08/15/15 0530 08/16/15 0422  BUN 17 19 27*  --  17 25*  CREATININE 1.29* 1.41* 1.80*  --  1.11* 1.45*  NA 141 138 136  --  141 142  K 2.3* 3.4* 4.2  --  4.0 3.8  MG 1.4* 1.8  --   --   --  1.7  PHOS  --   --   --  1.6* 1.6* 3.4   Glucose Profile:  Recent Labs  08/15/15 1600 08/15/15 2111 08/16/15 0710  GLUCAP 72 123* 249*   Meds: ss novolog  Urine Volume: no UOP documented  Height:   Ht Readings from Last 1 Encounters:  08/30/2015  (1.753 m)    Weight:   Wt Readings from Last 1 Encounters:  08/15/15 168 lb 6.9 oz (76.4 kg)    Filed Weights   August 30, 2015 1300 08/14/15 1140 08/15/15 0633  Weight: 164 lb 6.4 oz (74.571 kg) 177 lb 14.6 oz (80.7 kg) 168 lb 6.9 oz (76.4 kg)    BMI:  Body mass index is 24.86 kg/(m^2).  Estimated Nutritional Needs:   Kcal:   1308-6578 kcals (BEE 1434, 1.2 AF, 1.2-1.4 IF)   Protein:  113-150 g (1.5-2.0 g/kg)   Fluid:  1000 mL plus UOP  HIGH Care Level  Romelle Starcher MS, RD, LDN [phone] Pager

## 2015-08-16 NOTE — Progress Notes (Signed)
Spoke with Mungal regarding culture results-awaiting for ID to follow

## 2015-08-16 NOTE — Progress Notes (Signed)
PRE HD   08/16/15 0900  Neurological  Level of Consciousness Responds to Pain (AT TIMES WILL OPEN EYES)  Orientation Level Intubated/Tracheostomy - Unable to assess  Respiratory  Respiratory Pattern Regular;Unlabored  Chest Assessment Chest expansion symmetrical  Bilateral Breath Sounds Fine crackles  Cough None  Tracheostomy Shiley 6 mm Uncuffed  No Placement Date or Time found.   Inserted prior to hospital arrival?: Yes  Brand: Shiley  Size (mm): 6 mm  Style: Uncuffed  Status Secured  Site Assessment Clean;Dry  Ties Assessment Clean;Dry  Emergency Equipment at bedside Yes  Cardiac  Pulse Regular  ECG Monitor Yes  Cardiac Rhythm NSR  Vascular  Edema Generalized;Left lower extremity;Right lower extremity  Generalized Edema +1  RLE Edema +2  LLE Edema +2  Integumentary  Integumentary (WDL) X  Skin Color Appropriate for ethnicity  Skin Condition Dry  Skin Integrity Rash;MSAD  Rash Location Groin  Musculoskeletal  Musculoskeletal (WDL) X  Generalized Weakness Yes  Gastrointestinal  Bowel Sounds Assessment Active  NG/OG Tube Other (Comment) Right nare  Placement Date/Time: 08/15/15 1000   Person Inserting Catheter: Cleda Clarks RN///////Dobbhoff  Tube Type: Other (Comment)  Tube Location: Right nare  Site Assessment Clean;Dry;Intact  Status Infusing tube feed  GU Assessment  Genitourinary (WDL) X  Genitourinary Symptoms Other (Comment) (HD)  Psychosocial  Psychosocial (WDL) X  Patient Behaviors Not interactive;Withdrawn  Emotional support given Given to patient  Wound/Incision (LDAs)  Type of Wound/Incision (LDA) Pressure ulcer

## 2015-08-16 NOTE — Progress Notes (Signed)
POST HD   08/16/15 1300  Report  Report Received From Cleda Clarks, RN  Vital Signs  Temp 97.8 F (36.6 C)  Temp Source Axillary  Pulse Rate 88  Pulse Rate Source Monitor  Resp 19  BP 99/72 mmHg  BP Location Right Arm  BP Method Automatic  Patient Position (if appropriate) Lying  Dialysis Weight  Weight 82.1 kg (181 lb)  Type of Weight Post-Dialysis  During Hemodialysis Assessment  Intra-Hemodialysis Comments HD TREATMENT ENDED. PT ASLEEP. BLLOOD RETURNED AND CVC ACCESSED WITHOUT DIFFICULTY.    Post-Hemodialysis Assessment  Rinseback Volume (mL) 250 mL  Dialyzer Clearance Lightly streaked  Duration of HD Treatment -hour(s) 3.5 hour(s)  Hemodialysis Intake (mL) 500 mL  UF Total -Machine (mL) 1500 mL  Net UF (mL) 1000 mL  Tolerated HD Treatment Yes  Post-Hemodialysis Comments HD TREATMENT ENDED. PT ASLEEP. BLLOOD RETURNED AND CVC ACCESSED WITHOUT DIFFICULTY.    Education / Care Plan  Hemodialysis Education Provided Yes  Documented Education in Clinical Pathway Yes  Hemodialysis Catheter Double-lumen  No Placement Date or Time found.   Placed prior to admission: Yes  Hemodialysis Catheter Type: Double-lumen  Site Condition No complications  Blue Lumen Status Heparin locked  Red Lumen Status Heparin locked  Purple Lumen Status N/A  Catheter fill solution Heparin 1000 units/ml  Catheter fill volume (Arterial) 2.2 cc  Catheter fill volume (Venous) 2.3  Dressing Type Gauze/Drain sponge  Dressing Status Clean;Dry;Intact  Drainage Description None  Dressing Change Due 08/18/15  Post treatment catheter status Capped and Clamped

## 2015-08-16 NOTE — Progress Notes (Signed)
ANTIBIOTIC CONSULT NOTE - INITIAL  Pharmacy Consult for Vancomycin/gentamicin Indication: Sepsis/Sacral decubitus  Allergies  Allergen Reactions  . Contrast Media [Iodinated Diagnostic Agents] Anaphylaxis  . Ampicillin Rash    Patient Measurements: Height:  (175.3 cm) Weight: 181 lb (82.1 kg) IBW/kg (Calculated) : 66.2   Vital Signs: Temp: 97.8 F (36.6 C) (09/27 1300) Temp Source: Axillary (09/27 1300) BP: 99/72 mmHg (09/27 1300) Pulse Rate: 88 (09/27 1300) Intake/Output from previous day: 09/26 0701 - 09/27 0700 In: 800.8 [NG/GT:750.8; IV Piggyback:50] Out: -  Intake/Output from this shift: Total I/O In: 3 [I.V.:3] Out: 1000 [Other:1000]  Labs:  Recent Labs  08/14/15 0510 08/14/15 1747 08/15/15 0530 08/16/15 0422  WBC 21.0* 14.8* 14.9* 9.4  HGB 7.9* 6.8* 9.1* 8.5*  PLT 18* 43* 21* 15*  CREATININE 1.80*  --  1.11* 1.45*   Estimated Creatinine Clearance: 53.2 mL/min (by C-G formula based on Cr of 1.45). No results for input(s): VANCOTROUGH, VANCOPEAK, VANCORANDOM, GENTTROUGH, GENTPEAK, GENTRANDOM, TOBRATROUGH, TOBRAPEAK, TOBRARND, AMIKACINPEAK, AMIKACINTROU, AMIKACIN in the last 72 hours.   Microbiology: Recent Results (from the past 720 hour(s))  Culture, blood (routine x 2)     Status: None   Collection Time: 08/01/15  7:00 PM  Result Value Ref Range Status   Specimen Description BLOOD RIGHT ANTECUBITAL  Final   Special Requests BOTTLES DRAWN AEROBIC ONLY 5CC  Final   Culture NO GROWTH 5 DAYS  Final   Report Status 08/06/2015 FINAL  Final  Culture, blood (routine x 2)     Status: None   Collection Time: 08/01/15  7:10 PM  Result Value Ref Range Status   Specimen Description BLOOD RIGHT HAND  Final   Special Requests IN PEDIATRIC BOTTLE 3CC  Final   Culture  Setup Time   Final    GRAM POSITIVE COCCI IN CLUSTERS AEROBIC BOTTLE ONLY CRITICAL RESULT CALLED TO, READ BACK BY AND VERIFIED WITH: D TAYLOR,RN AT 1115 08/02/15 BY L BENFIELD    Culture  STAPHYLOCOCCUS SPECIES (COAGULASE NEGATIVE)  Final   Report Status 08/06/2015 FINAL  Final   Organism ID, Bacteria STAPHYLOCOCCUS SPECIES (COAGULASE NEGATIVE)  Final      Susceptibility   Staphylococcus species (coagulase negative) - MIC*    CIPROFLOXACIN >=8 RESISTANT Resistant     ERYTHROMYCIN <=0.25 SENSITIVE Sensitive     GENTAMICIN 8 INTERMEDIATE Intermediate     OXACILLIN >=4 RESISTANT Resistant     TETRACYCLINE <=1 SENSITIVE Sensitive     VANCOMYCIN 1 SENSITIVE Sensitive     TRIMETH/SULFA 160 RESISTANT Resistant     CLINDAMYCIN <=0.25 SENSITIVE Sensitive     RIFAMPIN <=0.5 SENSITIVE Sensitive     Inducible Clindamycin NEGATIVE Sensitive     * STAPHYLOCOCCUS SPECIES (COAGULASE NEGATIVE)  Culture, blood (routine x 2)     Status: None   Collection Time: 08/02/15  7:00 PM  Result Value Ref Range Status   Specimen Description BLOOD RIGHT ANTECUBITAL  Final   Special Requests BOTTLES DRAWN AEROBIC ONLY 10CC  Final   Culture  Setup Time   Final    GRAM POSITIVE COCCI IN CLUSTERS AEROBIC BOTTLE ONLY CRITICAL RESULT CALLED TO, READ BACK BY AND VERIFIED WITH: DR Gwenevere Abbot RN 1549 08/03/15 A BROWNING    Culture   Final    STAPHYLOCOCCUS SPECIES (COAGULASE NEGATIVE) SUSCEPTIBILITIES PERFORMED ON PREVIOUS CULTURE WITHIN THE LAST 5 DAYS.    Report Status 08/06/2015 FINAL  Final  Culture, blood (routine x 2)     Status: None   Collection Time:  08/02/15  7:05 PM  Result Value Ref Range Status   Specimen Description BLOOD RIGHT HAND  Final   Special Requests BOTTLES DRAWN AEROBIC AND ANAEROBIC 10CC  Final   Culture NO GROWTH 5 DAYS  Final   Report Status 08/07/2015 FINAL  Final  Blood Culture (routine x 2)     Status: None (Preliminary result)   Collection Time: 08/06/2015  8:51 AM  Result Value Ref Range Status   Specimen Description BLOOD UNKO  Final   Special Requests BOTTLES DRAWN AEROBIC AND ANAEROBIC  3CC  Final   Culture NO GROWTH 4 DAYS  Final   Report Status PENDING  Incomplete   Blood Culture (routine x 2)     Status: None (Preliminary result)   Collection Time: 08/10/2015  9:20 AM  Result Value Ref Range Status   Specimen Description BLOOD LEFT ARM  Final   Special Requests BOTTLES DRAWN AEROBIC AND ANAEROBIC  1CC  Final   Culture NO GROWTH 4 DAYS  Final   Report Status PENDING  Incomplete  Wound culture     Status: None   Collection Time: 07/21/2015  9:20 AM  Result Value Ref Range Status   Specimen Description DECUBITIS  Final   Special Requests Normal  Final   Gram Stain   Final    FEW WBC SEEN MANY GRAM NEGATIVE RODS RARE GRAM POSITIVE COCCI    Culture   Final    HEAVY GROWTH ESCHERICHIA COLI MODERATE GROWTH ENTEROBACTER AEROGENES PROTEUS MIRABILIS HEAVY GROWTH ENTEROCOCCUS SPECIES VRE HAVE INTRINSIC RESISTANCE TO MOST COMMONLY USED ANTIBIOTICS AND THE ABILITY TO ACQUIRE RESISTANCE TO MOST AVAILABLE ANTIBIOTICS.    Report Status 08/16/2015 FINAL  Final   Organism ID, Bacteria ESCHERICHIA COLI  Final   Organism ID, Bacteria ENTEROBACTER AEROGENES  Final   Organism ID, Bacteria PROTEUS MIRABILIS  Final   Organism ID, Bacteria ENTEROCOCCUS SPECIES  Final      Susceptibility   Enterobacter aerogenes - MIC*    CEFTAZIDIME <=1 SENSITIVE Sensitive     CEFAZOLIN >=64 RESISTANT Resistant     CEFTRIAXONE <=1 SENSITIVE Sensitive     CIPROFLOXACIN <=0.25 SENSITIVE Sensitive     GENTAMICIN <=1 SENSITIVE Sensitive     IMIPENEM 1 SENSITIVE Sensitive     TRIMETH/SULFA <=20 SENSITIVE Sensitive     * MODERATE GROWTH ENTEROBACTER AEROGENES   Escherichia coli - MIC*    AMPICILLIN <=2 SENSITIVE Sensitive     CEFTAZIDIME <=1 SENSITIVE Sensitive     CEFAZOLIN <=4 SENSITIVE Sensitive     CEFTRIAXONE <=1 SENSITIVE Sensitive     CIPROFLOXACIN <=0.25 SENSITIVE Sensitive     GENTAMICIN <=1 SENSITIVE Sensitive     IMIPENEM <=0.25 SENSITIVE Sensitive     TRIMETH/SULFA <=20 SENSITIVE Sensitive     * HEAVY GROWTH ESCHERICHIA COLI   Proteus mirabilis - MIC*    AMPICILLIN  >=32 RESISTANT Resistant     CEFTAZIDIME <=1 SENSITIVE Sensitive     CEFAZOLIN 8 SENSITIVE Sensitive     CEFTRIAXONE <=1 SENSITIVE Sensitive     CIPROFLOXACIN <=0.25 SENSITIVE Sensitive     GENTAMICIN <=1 SENSITIVE Sensitive     IMIPENEM 1 SENSITIVE Sensitive     TRIMETH/SULFA <=20 SENSITIVE Sensitive     * PROTEUS MIRABILIS   Enterococcus species - MIC*    AMPICILLIN >=32 RESISTANT Resistant     VANCOMYCIN >=32 RESISTANT Resistant     GENTAMICIN SYNERGY SENSITIVE Sensitive     TETRACYCLINE Value in next row Resistant  RESISTANT>=16    * HEAVY GROWTH ENTEROCOCCUS SPECIES  MRSA PCR Screening     Status: None   Collection Time: 09/08/2015  2:38 PM  Result Value Ref Range Status   MRSA by PCR NEGATIVE NEGATIVE Final    Comment:        The GeneXpert MRSA Assay (FDA approved for NASAL specimens only), is one component of a comprehensive MRSA colonization surveillance program. It is not intended to diagnose MRSA infection nor to guide or monitor treatment for MRSA infections.   Blood culture (single)     Status: None (Preliminary result)   Collection Time: Sep 08, 2015  3:36 PM  Result Value Ref Range Status   Specimen Description BLOOD RIGHT ASSIST CONTROL  Final   Special Requests BOTTLES DRAWN AEROBIC AND ANAEROBIC  Final   Culture NO GROWTH 4 DAYS  Final   Report Status PENDING  Incomplete  Wound culture     Status: None (Preliminary result)   Collection Time: 08/13/15 12:37 PM  Result Value Ref Range Status   Specimen Description WOUND  Final   Special Requests Normal  Final   Gram Stain   Final    FEW WBC SEEN TOO NUMEROUS TO COUNT GRAM NEGATIVE RODS FEW GRAM POSITIVE COCCI    Culture   Final    HEAVY GROWTH ESCHERICHIA COLI MODERATE GROWTH PROTEUS MIRABILIS LIGHT GROWTH KLEBSIELLA PNEUMONIAE MODERATE GROWTH ENTEROCOCCUS GALLINARUM CRITICAL RESULT CALLED TO, READ BACK BY AND VERIFIED WITH: [facility] BORBA AT 1042 08/16/15 DV    Report Status PENDING  Incomplete    Organism ID, Bacteria ESCHERICHIA COLI  Final   Organism ID, Bacteria PROTEUS MIRABILIS  Final   Organism ID, Bacteria KLEBSIELLA PNEUMONIAE  Final   Organism ID, Bacteria ENTEROCOCCUS GALLINARUM  Final      Susceptibility   Escherichia coli - MIC*    AMPICILLIN >=32 RESISTANT Resistant     CEFTAZIDIME 4 RESISTANT Resistant     CEFAZOLIN >=64 RESISTANT Resistant     CEFTRIAXONE 16 RESISTANT Resistant     GENTAMICIN 2 SENSITIVE Sensitive     IMIPENEM >=16 RESISTANT Resistant     TRIMETH/SULFA <=20 SENSITIVE Sensitive     Extended ESBL POSITIVE Resistant     PIP/TAZO Value in next row Resistant      RESISTANT>=128    CIPROFLOXACIN Value in next row Sensitive      SENSITIVE<=0.25    * HEAVY GROWTH ESCHERICHIA COLI   Klebsiella pneumoniae - MIC*    AMPICILLIN Value in next row Resistant      SENSITIVE<=0.25    CEFTAZIDIME Value in next row Resistant      SENSITIVE<=0.25    CEFAZOLIN Value in next row Resistant      SENSITIVE<=0.25    CEFTRIAXONE Value in next row Resistant      SENSITIVE<=0.25    CIPROFLOXACIN Value in next row Resistant      SENSITIVE<=0.25    GENTAMICIN Value in next row Sensitive      SENSITIVE<=0.25    IMIPENEM Value in next row Resistant      SENSITIVE<=0.25    TRIMETH/SULFA Value in next row Resistant      SENSITIVE<=0.25    PIP/TAZO Value in next row Resistant      RESISTANT>=128    * LIGHT GROWTH KLEBSIELLA PNEUMONIAE   Proteus mirabilis - MIC*    AMPICILLIN Value in next row Resistant      RESISTANT>=128    CEFTAZIDIME Value in next row Sensitive  RESISTANT>=128    CEFAZOLIN Value in next row Sensitive      RESISTANT>=128    CEFTRIAXONE Value in next row Sensitive      RESISTANT>=128    CIPROFLOXACIN Value in next row Sensitive      RESISTANT>=128    GENTAMICIN Value in next row Sensitive      RESISTANT>=128    IMIPENEM Value in next row Sensitive      RESISTANT>=128    TRIMETH/SULFA Value in next row Sensitive      RESISTANT>=128     PIP/TAZO Value in next row Sensitive      SENSITIVE<=4    * MODERATE GROWTH PROTEUS MIRABILIS   Enterococcus gallinarum - MIC*    AMPICILLIN Value in next row Resistant      SENSITIVE<=4    GENTAMICIN SYNERGY Value in next row Sensitive      SENSITIVE<=4    CIPROFLOXACIN Value in next row Resistant      RESISTANT>=8    TETRACYCLINE Value in next row Resistant      RESISTANT>=16    * MODERATE GROWTH ENTEROCOCCUS GALLINARUM  Wound culture     Status: None (Preliminary result)   Collection Time: 08/15/15  2:53 PM  Result Value Ref Range Status   Specimen Description WOUND  Final   Special Requests NONE  Final   Gram Stain PENDING  Incomplete   Culture NO GROWTH < 24 HOURS  Final   Report Status PENDING  Incomplete    Medical History: Past Medical History  Diagnosis Date  . Obesity   . Dyslipidemia   . Hypertension   . Coronary artery disease     s/p BMS 2010 LAD  . Dysrhythmia     ventricular tachycardia resolved after LAD stent and beta blocker  . Diabetes mellitus     with retinopathy, neuropathy and microalbuminemia  . ESRD (end stage renal disease) on dialysis     Medications:  Scheduled:  . sodium chloride   Intravenous Once  . sodium chloride   Intravenous Once  . epoetin (EPOGEN/PROCRIT) injection  4,000 Units Intravenous Q T,Th,Sa-HD  . feeding supplement (PRO-STAT SUGAR FREE 64)  30 mL Per Tube TID  . free water  100 mL Per Tube 3 times per day  . [START ON 08/18/2015] gentamicin  2 mg/kg (Ideal) Intravenous Q T,Th,Sat-1800  . gentamicin  3 mg/kg (Ideal) Intravenous Once  . insulin aspart  0-15 Units Subcutaneous 4 times per day  . sodium chloride  3 mL Intravenous Q12H  . sodium hypochlorite   Irrigation TID  . vancomycin  750 mg Intravenous Q T,Th,Sa-HD   Infusions:  . feeding supplement (VITAL 1.5 CAL) 1,000 mL (08/16/15 1318)  . norepinephrine (LEVOPHED) Adult infusion 1 mcg/min (08/13/15 1637)   PRN: sodium chloride, sodium chloride, alteplase,  lidocaine (PF), lidocaine-prilocaine, pentafluoroprop-tetrafluoroeth  Assessment: 49 y/o F with sepsis likely from stage IV sacral decubitus with wound growing multiple MDR organisms.   Goal of Therapy:  Pre HD vancomycin level 15-25 and post-HD level 5-15 Gentamicin peak 6-8, gentamicin trough (post-HD) < 2  Plan:  Continue vancomycin 750 mg HD with level scheduled for 10/1. Will begin gentamicin 200 mg (3 mg/kg) once followed by 2 mg/kg qHD. Will check a gentamicin peak with the dose after the 2nd dialysis session and check a trough after the third dialysis session.   Luisa Hart D 08/16/2015,2:55 PM

## 2015-08-16 NOTE — Care Management Note (Signed)
Patient just recently started receiving her outpatient dialysis at [facility] [address] 2nd shift.  She is not new to dialysis.  I will monitor her progression and update clinic with additional medical records at discharge. Ivor Reining Dialysis Liaison  [phone]

## 2015-08-16 NOTE — Progress Notes (Signed)
[facility] - [place] at [facility]   PATIENT NAME: Courtney [name]    MR#:  [id number]  DATE OF BIRTH:  21-Feb-1976  SUBJECTIVE:  CHIEF COMPLAINT:  Patient is still altered . Opens her eyes to verbal commands but not communicating. Started pt on tube feeds - Vital   REVIEW OF SYSTEMS:  Unable to obtain review of systems DRUG ALLERGIES:   Allergies  Allergen Reactions  . Contrast Media [Iodinated Diagnostic Agents] Anaphylaxis  . Ampicillin Rash    VITALS:  Blood pressure 88/56, pulse 95, temperature 98.8 F (37.1 C), temperature source Axillary, resp. rate 20, height  (1.753 m), weight 83.5 kg (184 lb 1.4 oz), SpO2 100 %.  PHYSICAL EXAMINATION:  GENERAL:  49 y.o.-year-old patient lying in the bed with no acute distress.  EYES: Pupils equal, round, reactive to light and accommodation No scleral icterus. HEENT:  Normocephalic. Oropharynx and nasopharynx clear. Trach site is intact NECK:  Supple, no jugular venous distention. No thyroid enlargement, no tenderness.  LUNGS: Moderate air entry, decreased breath sounds at the bases, no wheezing, rales,rhonchi or crepitation. No use of accessory muscles of respiration.  CARDIOVASCULAR: S1, S2 normal. No murmurs, rubs, or gallops.  ABDOMEN: Soft, nondistended. Bowel sounds present.  EXTREMITIES: No pedal edema, cyanosis, or clubbing.  NEUROLOGIC: Patient is with altered mental status  PSYCHIATRIC: The patient is disoriented SKIN: Large sacral decub with his ulcer . No obvious rash   LABORATORY PANEL:   CBC  Recent Labs Lab 08/16/15 0422  WBC 9.4  HGB 8.5*  HCT 26.3*  PLT 15*   ------------------------------------------------------------------------------------------------------------------  Chemistries   Recent Labs Lab 08/17/2015 0850  08/16/15 0422  NA 141  < > 142  K 2.3*  < > 3.8  CL 103  < > 105  CO2 30  < > 31  GLUCOSE 87  < > 234*  BUN 17  < > 25*  CREATININE 1.29*  < >  1.45*  CALCIUM 7.1*  < > 7.6*  MG 1.4*  < > 1.7  AST 16  --   --   ALT 13*  --   --   ALKPHOS 175*  --   --   BILITOT 0.9  --   --   < > = values in this interval not displayed. ------------------------------------------------------------------------------------------------------------------  Cardiac Enzymes  Recent Labs Lab 2015-08-17 0850  TROPONINI 0.22*   ------------------------------------------------------------------------------------------------------------------  RADIOLOGY:  Dg Abd 1 View  08/15/2015   CLINICAL DATA:  OG tube placement  EXAM: ABDOMEN - 1 VIEW  COMPARISON:  06/15/2015  FINDINGS: OG tube tip is in the distal stomach. Nonobstructive bowel gas pattern. Linear high density areas throughout the abdomen, likely peritoneal calcifications. No visible free air.  IMPRESSION: OG tube tip in the distal stomach.   Electronically Signed   By: Charlett Nose M.D.   On: 08/15/2015 10:12    EKG:   Orders placed or performed in visit on August 17, 2015  . EKG 12-Lead  . EKG 12-Lead  . EKG 12-Lead    ASSESSMENT AND PLAN:   1. Sepsis . Probably from stage IV foul-smelling sacral decubiti, left lower lobe infiltrate versus atelectasis Patient is started on meropenem and vancomycin and  Discontinued cefepime and Levaquin. Follow-up  blood Culture with no growth so far Wound culture with gram-negative rods and few gram-positive cocci off  levophed as blood pressure is better. Hold all antihypertensives medication Appreciate Critical care recommendations.  ID consult is placed   2.  Acute encephalopathy- likely from problem #1/? Seizures Initial CT scan was negative.  Patient has no history of seizure.  Status post EEG  Appreciate neurology recommendations  3. symptomatic anemia-   status post1 unit of blood transfusion and hemoglobin is at 8.5 today. Will repeat CBC in a.m  4. Thrombocytopenia- no acute bleeding. Platelet count is at 15,000 today.Will hold off on heparin  subcutaneous  Transfuse platelets if platelet count is less than or equal to 10,000 HIT/DIC panel is ordered, results are pending Oncology recommendations are appreciated Nephrolo agy stopped  using heparin during dialysis, discussed with nephrology they would rather use citrate instead of heparin  5. End-stage renal disease on hemodialysis- patient is getting hemodialysis today   6. Oxygenate with trach collar at this point 7. Elevated troponin likely demand ischemia with septic shock 8. Stage IV sacral decubitus- status post bedside wound debridement. Appreciate surgery recommendations. Patient needs wound VAC eventually. Follow-up on the cultures obtained from the ED and obtained from yesterday during debridement 9. Hypokalemia and hypomagnesemia- replace potassium and magnesium IV. Check magnesium 10. Nutrition- tube feeds with vital   All the records are reviewed and case discussed with Care Management/Social Workerr. I have discussed the plan of care with patient's husband  Mr. [name], he is agreeable with the current plan of care   CODE STATUS: Full code  TOTAL CRITICAL CARE TIME TAKING CARE OF THIS PATIENT: 35 minutes.   POSSIBLE D/C to LTAC , DEPENDING ON CLINICAL CONDITION.   Ramonita Lab M.D on 08/16/2015 at 12:15 PM  Between 7am to 6pm - Pager - [phone] After 6pm go to [email or web address] - password [email or web address] [facility]  [facility] [place]  Office  [phone]  CC: Primary care physician; [name], MD

## 2015-08-16 NOTE — Plan of Care (Signed)
Spoke with Dr Dema Severin and Gouru regarding patients husband Johns request to give him a call this am. Cleda Clarks and Gouru his telephone number

## 2015-08-17 DIAGNOSIS — G9341 Metabolic encephalopathy: Secondary | ICD-10-CM

## 2015-08-17 LAB — BASIC METABOLIC PANEL
ANION GAP: 5 (ref 5–15)
ANION GAP: 5 (ref 5–15)
BUN: 18 mg/dL (ref 6–20)
BUN: 23 mg/dL — ABNORMAL HIGH (ref 6–20)
CHLORIDE: 104 mmol/L (ref 101–111)
CO2: 31 mmol/L (ref 22–32)
CO2: 33 mmol/L — ABNORMAL HIGH (ref 22–32)
Calcium: 7.2 mg/dL — ABNORMAL LOW (ref 8.9–10.3)
Calcium: 7.6 mg/dL — ABNORMAL LOW (ref 8.9–10.3)
Chloride: 105 mmol/L (ref 101–111)
Creatinine, Ser: 0.94 mg/dL (ref 0.44–1.00)
Creatinine, Ser: 1.1 mg/dL — ABNORMAL HIGH (ref 0.44–1.00)
GFR calc Af Amer: >60 mL/min (ref >60)
GFR calc non Af Amer: 58 mL/min — ABNORMAL LOW (ref >60)
Glucose, Bld: 148 mg/dL — ABNORMAL HIGH (ref 65–99)
Glucose, Bld: 118 mg/dL — ABNORMAL HIGH (ref 65–99)
POTASSIUM: 3 mmol/L — AB (ref 3.5–5.1)
POTASSIUM: 3.8 mmol/L (ref 3.5–5.1)
SODIUM: 141 mmol/L (ref 135–145)
SODIUM: 142 mmol/L (ref 135–145)

## 2015-08-17 LAB — CBC WITH DIFFERENTIAL/PLATELET
BASOS ABS: 0 10*3/uL (ref 0–0.1)
Basophils Relative: 0 %
Eosinophils Absolute: 0 10*3/uL (ref 0–0.7)
Eosinophils Relative: 0 %
HEMATOCRIT: 25.5 % — AB (ref 35.0–47.0)
Hemoglobin: 8.3 g/dL — ABNORMAL LOW (ref 12.0–16.0)
Lymphs Abs: 0.9 10*3/uL — ABNORMAL LOW (ref 1.0–3.6)
MCH: 30.1 pg (ref 26.0–34.0)
MCHC: 32.5 g/dL (ref 32.0–36.0)
MCV: 92.6 fL (ref 80.0–100.0)
Monocytes Absolute: 0.3 10*3/uL (ref 0.2–0.9)
Monocytes Relative: 5 %
NEUTROS ABS: 5.4 10*3/uL (ref 1.4–6.5)
Neutrophils Relative %: 82 %
Platelets: 17 10*3/uL — CL (ref 150–440)
RBC: 2.75 MIL/uL — AB (ref 3.80–5.20)
RDW: 16.9 % — ABNORMAL HIGH (ref 11.5–14.5)
WBC: 6.7 10*3/uL (ref 3.6–11.0)

## 2015-08-17 LAB — WOUND CULTURE: Special Requests: NORMAL

## 2015-08-17 LAB — CULTURE, BLOOD (ROUTINE X 2)
CULTURE: NO GROWTH
CULTURE: NO GROWTH

## 2015-08-17 LAB — BLOOD GAS, ARTERIAL
Acid-Base Excess: 8.8 mmol/L — ABNORMAL HIGH (ref 0.0–3.0)
Allens test (pass/fail): POSITIVE — AB
Bicarbonate: 33.5 mEq/L — ABNORMAL HIGH (ref 21.0–28.0)
FIO2: 28
O2 SAT: 98.6 %
PATIENT TEMPERATURE: 37
pCO2 arterial: 45 mmHg (ref 32.0–48.0)
pH, Arterial: 7.48 — ABNORMAL HIGH (ref 7.350–7.450)
pO2, Arterial: 111 mmHg — ABNORMAL HIGH (ref 83.0–108.0)

## 2015-08-17 LAB — GLUCOSE, CAPILLARY
GLUCOSE-CAPILLARY: 143 mg/dL — AB (ref 65–99)
GLUCOSE-CAPILLARY: 87 mg/dL (ref 65–99)
Glucose-Capillary: 148 mg/dL — ABNORMAL HIGH (ref 65–99)
Glucose-Capillary: 109 mg/dL — ABNORMAL HIGH (ref 65–99)

## 2015-08-17 LAB — CULTURE, BLOOD (SINGLE): CULTURE: NO GROWTH

## 2015-08-17 LAB — C DIFFICILE QUICK SCREEN W PCR REFLEX
C DIFFICILE (CDIFF) TOXIN: NEGATIVE
C DIFFICLE (CDIFF) ANTIGEN: NEGATIVE
C Diff interpretation: NEGATIVE

## 2015-08-17 LAB — MAGNESIUM: Magnesium: 1.7 mg/dL (ref 1.7–2.4)

## 2015-08-17 MED ORDER — NYSTATIN 100000 UNIT/GM EX POWD
Freq: Three times a day (TID) | CUTANEOUS | Status: DC
  Administered 2015-08-17 – 2015-09-07 (×64): via TOPICAL
  Administered 2015-09-08: 1 g via TOPICAL
  Administered 2015-09-08 – 2015-09-27 (×58): via TOPICAL
  Administered 2015-09-27: 1 via TOPICAL
  Administered 2015-09-28 – 2015-10-03 (×16): via TOPICAL
  Filled 2015-08-17 (×6): qty 15

## 2015-08-17 MED ORDER — SODIUM CHLORIDE 0.9 % IV SOLN
6.0000 mg/kg | INTRAVENOUS | Status: DC
  Filled 2015-08-17: qty 9.85

## 2015-08-17 MED ORDER — SODIUM CHLORIDE 0.9 % IV SOLN
500.0000 mg | Freq: Once | INTRAVENOUS | Status: AC
  Administered 2015-08-17: 500 mg via INTRAVENOUS
  Filled 2015-08-17: qty 0.5

## 2015-08-17 MED ORDER — SODIUM CHLORIDE 0.9 % IV SOLN
500.0000 mg | INTRAVENOUS | Status: DC
  Administered 2015-08-17 – 2015-08-19 (×3): 500 mg via INTRAVENOUS
  Filled 2015-08-17 (×3): qty 0.5

## 2015-08-17 MED ORDER — POTASSIUM CHLORIDE 10 MEQ/100ML IV SOLN
10.0000 meq | INTRAVENOUS | Status: AC
  Administered 2015-08-17 (×4): 10 meq via INTRAVENOUS
  Filled 2015-08-17 (×4): qty 100

## 2015-08-17 NOTE — Progress Notes (Signed)
ELECTROLYTE MANAGEMENT - INITIAL  Pharmacy Consult for Electrolyte Management Indication: hypokalemia  Allergies  Allergen Reactions  . Contrast Media [Iodinated Diagnostic Agents] Anaphylaxis  . Ampicillin Rash    Patient Measurements: Height:  (175.3 cm) Weight: 181 lb (82.1 kg) IBW/kg (Calculated) : 66.2   Vital Signs: Temp: 98.8 F (37.1 C) (09/28 0500) Temp Source: Axillary (09/28 0500) BP: 107/58 mmHg (09/28 0600) Pulse Rate: 94 (09/28 0600) Intake/Output from previous day: 09/27 0701 - 09/28 0700 In: 1153 [I.V.:3; NG/GT:1150] Out: 1175 [Stool:175] Intake/Output from this shift: Total I/O In: 1150 [NG/GT:1150] Out: 175 [Stool:175]  Labs:  Recent Labs  08/15/15 0530 08/16/15 0422 08/16/15 1323 08/17/15 0446  WBC 14.9* 9.4  --  6.7  HGB 9.1* 8.5*  --  8.3*  HCT 27.8* 26.3*  --  25.5*  PLT 21* 15*  --  17*  APTT  --   --  82*  --   INR  --   --  1.48  --      Recent Labs  08/14/15 1203 08/15/15 0530 08/16/15 0422 08/17/15 0446  NA  --  141 142 141  K  --  4.0 3.8 3.0*  CL  --  104 105 105  CO2  --  32 31 31  GLUCOSE  --  76 234* 148*  BUN  --  17 25* 18  CREATININE  --  1.11* 1.45* 0.94  CALCIUM  --  7.8* 7.6* 7.2*  MG  --   --  1.7 1.7  PHOS 1.6* 1.6* 3.4  --    Estimated Creatinine Clearance: 82.1 mL/min (by C-G formula based on Cr of 0.94).    Recent Labs  08/16/15 1725 08/16/15 2209 08/17/15 0522  GLUCAP 121* 108* 148*    Medical History: Past Medical History  Diagnosis Date  . Obesity   . Dyslipidemia   . Hypertension   . Coronary artery disease     s/p BMS 2010 LAD  . Dysrhythmia     ventricular tachycardia resolved after LAD stent and beta blocker  . Diabetes mellitus     with retinopathy, neuropathy and microalbuminemia  . ESRD (end stage renal disease) on dialysis     Medications:  Scheduled:  . sodium chloride   Intravenous Once  . sodium chloride   Intravenous Once  . DAPTOmycin (CUBICIN)  IV  6 mg/kg  Intravenous Q24H  . epoetin (EPOGEN/PROCRIT) injection  4,000 Units Intravenous Q T,Th,Sa-HD  . feeding supplement (PRO-STAT SUGAR FREE 64)  30 mL Per Tube TID  . free water  100 mL Per Tube 3 times per day  . insulin aspart  0-15 Units Subcutaneous 4 times per day  . potassium chloride  10 mEq Intravenous Q1 Hr x 4  . sodium chloride  3 mL Intravenous Q12H  . sodium hypochlorite   Irrigation TID    Assessment: 49 yo female admitted for sepsis, found to have hypokalemia. Pharmacy consulted for electrolyte management.  9/24  K = 3.4  Plan:  Will give KCl 40 meq IV.  Will check Potassium level in AM.    9/25 AM K+ 4.2 No supplementary electrolytes ordered. BMP in AM.  9/26. Electrolytes WNL. BMP in AM.  0927 AM potassium WNL, no supplement ordered. BMP in AM.  0928 AM potassium 3, ordered 10 mEq IV x 4 doses and repeat BMP this afternoon.  Carola Frost, Pharm.D. Clinical Pharmacist 08/17/2015,6:19 AM

## 2015-08-17 NOTE — Progress Notes (Signed)
Paged prime doc concerning pt's liquidy bowel movements.  Liquid stool pooled up around decub site.  Per Dr. Clint Guy, place rectal tube to contain bowel movements.

## 2015-08-17 NOTE — Progress Notes (Signed)
Subjective:  Pt still in CCU. Remains critically ill Tube feeds going without problem Rectal tube in place Opens eyes to verbal commands   Objective:  Vital signs in last 24 hours:  Temp:  [97.8 F (36.6 C)-98.8 F (37.1 C)] 98.8 F (37.1 C) (09/28 0500) Pulse Rate:  [87-112] 94 (09/28 0600) Resp:  [12-28] 16 (09/28 0600) BP: (88-140)/(41-79) 107/58 mmHg (09/28 0600) SpO2:  [100 %] 100 % (09/28 0600) FiO2 (%):  [28 %] 28 % (09/28 0730) Weight:  [82.1 kg (181 lb)-83.5 kg (184 lb 1.4 oz)] 82.1 kg (181 lb) (09/27 1300)  Weight change:  Filed Weights   08/15/15 0633 08/16/15 0900 08/16/15 1300  Weight: 76.4 kg (168 lb 6.9 oz) 83.5 kg (184 lb 1.4 oz) 82.1 kg (181 lb)    Intake/Output: I/O last 3 completed shifts: In: 1753 [I.V.:3; NG/GT:1700; IV Piggyback:50] Out: 1175 [Other:1000; Stool:175]   Intake/Output this shift:     Physical Exam: General: Chronically ill appearing  Head: Normocephalic, atraumatic. Moist oral mucosal membranes  Eyes: Eyes closed  Neck: Trach in place  Lungs:  Scattered rhonchi, normal effort  Heart: S1S2 no rubs  Abdomen:  Soft, nontender, BS present  Extremities:  + dependent and peripheral edema, b/l feet in soft support  Neurologic: Lethargic, opens eyes to verbal commands  Skin: No lesions  Access: R IJ permcath    Basic Metabolic Panel:  Recent Labs Lab 17-Aug-2015 0850 08/13/15 0250 08/14/15 0510 08/14/15 1203 08/15/15 0530 08/16/15 0422 08/17/15 0446  NA 141 138 136  --  141 142 141  K 2.3* 3.4* 4.2  --  4.0 3.8 3.0*  CL 103 103 102  --  104 105 105  CO2 30 28 28   --  32 31 31  GLUCOSE 87 123* 95  --  76 234* 148*  BUN 17 19 27*  --  17 25* 18  CREATININE 1.29* 1.41* 1.80*  --  1.11* 1.45* 0.94  CALCIUM 7.1* 7.5* 7.5*  --  7.8* 7.6* 7.2*  MG 1.4* 1.8  --   --   --  1.7 1.7  PHOS  --   --   --  1.6* 1.6* 3.4  --     Liver Function Tests:  Recent Labs Lab 2015-08-17 0850  AST 16  ALT 13*  ALKPHOS 175*  BILITOT 0.9   PROT 4.4*  ALBUMIN 1.6*   No results for input(s): LIPASE, AMYLASE in the last 168 hours. No results for input(s): AMMONIA in the last 168 hours.  CBC:  Recent Labs Lab 08/17/15 0850  08/14/15 0510 08/14/15 1747 08/15/15 0530 08/16/15 0422 08/17/15 0446  WBC 15.7*  < > 21.0* 14.8* 14.9* 9.4 6.7  NEUTROABS 13.8*  --   --  14.0*  --   --  5.4  HGB 6.4*  < > 7.9* 6.8* 9.1* 8.5* 8.3*  HCT 20.5*  < > 24.9* 21.4* 27.8* 26.3* 25.5*  MCV 95.5  < > 93.4 92.8 91.8 92.5 92.6  PLT 52*  < > 18* 43* 21* 15* 17*  < > = values in this interval not displayed.  Cardiac Enzymes:  Recent Labs Lab 08/17/15 0850  TROPONINI 0.22*    BNP: Invalid input(s): POCBNP  CBG:  Recent Labs Lab 08/16/15 0710 08/16/15 1127 08/16/15 1725 08/16/15 2209 08/17/15 0522  GLUCAP 249* 135* 121* 108* 148*    Microbiology: Results for orders placed or performed during the hospital encounter of Aug 17, 2015  Blood Culture (routine x 2)  Status: None   Collection Time: 08/09/2015  8:51 AM  Result Value Ref Range Status   Specimen Description BLOOD [name]  Final   Special Requests BOTTLES DRAWN AEROBIC AND ANAEROBIC  3CC  Final   Culture NO GROWTH 5 DAYS  Final   Report Status 08/17/2015 FINAL  Final  Blood Culture (routine x 2)     Status: None   Collection Time: 08/18/2015  9:20 AM  Result Value Ref Range Status   Specimen Description BLOOD LEFT ARM  Final   Special Requests BOTTLES DRAWN AEROBIC AND ANAEROBIC  1CC  Final   Culture NO GROWTH 5 DAYS  Final   Report Status 08/17/2015 FINAL  Final  Wound culture     Status: None   Collection Time: 08/06/2015  9:20 AM  Result Value Ref Range Status   Specimen Description DECUBITIS  Final   Special Requests Normal  Final   Gram Stain   Final    FEW WBC SEEN MANY GRAM NEGATIVE RODS RARE GRAM POSITIVE COCCI    Culture   Final    HEAVY GROWTH ESCHERICHIA COLI MODERATE GROWTH ENTEROBACTER AEROGENES PROTEUS MIRABILIS HEAVY GROWTH ENTEROCOCCUS  SPECIES VRE HAVE INTRINSIC RESISTANCE TO MOST COMMONLY USED ANTIBIOTICS AND THE ABILITY TO ACQUIRE RESISTANCE TO MOST AVAILABLE ANTIBIOTICS.    Report Status 08/16/2015 FINAL  Final   Organism ID, Bacteria ESCHERICHIA COLI  Final   Organism ID, Bacteria ENTEROBACTER AEROGENES  Final   Organism ID, Bacteria PROTEUS MIRABILIS  Final   Organism ID, Bacteria ENTEROCOCCUS SPECIES  Final      Susceptibility   Enterobacter aerogenes - MIC*    CEFTAZIDIME <=1 SENSITIVE Sensitive     CEFAZOLIN >=64 RESISTANT Resistant     CEFTRIAXONE <=1 SENSITIVE Sensitive     CIPROFLOXACIN <=0.25 SENSITIVE Sensitive     GENTAMICIN <=1 SENSITIVE Sensitive     IMIPENEM 1 SENSITIVE Sensitive     TRIMETH/SULFA <=20 SENSITIVE Sensitive     * MODERATE GROWTH ENTEROBACTER AEROGENES   Escherichia coli - MIC*    AMPICILLIN <=2 SENSITIVE Sensitive     CEFTAZIDIME <=1 SENSITIVE Sensitive     CEFAZOLIN <=4 SENSITIVE Sensitive     CEFTRIAXONE <=1 SENSITIVE Sensitive     CIPROFLOXACIN <=0.25 SENSITIVE Sensitive     GENTAMICIN <=1 SENSITIVE Sensitive     IMIPENEM <=0.25 SENSITIVE Sensitive     TRIMETH/SULFA <=20 SENSITIVE Sensitive     * HEAVY GROWTH ESCHERICHIA COLI   Proteus mirabilis - MIC*    AMPICILLIN >=32 RESISTANT Resistant     CEFTAZIDIME <=1 SENSITIVE Sensitive     CEFAZOLIN 8 SENSITIVE Sensitive     CEFTRIAXONE <=1 SENSITIVE Sensitive     CIPROFLOXACIN <=0.25 SENSITIVE Sensitive     GENTAMICIN <=1 SENSITIVE Sensitive     IMIPENEM 1 SENSITIVE Sensitive     TRIMETH/SULFA <=20 SENSITIVE Sensitive     * PROTEUS MIRABILIS   Enterococcus species - MIC*    AMPICILLIN >=32 RESISTANT Resistant     VANCOMYCIN >=32 RESISTANT Resistant     GENTAMICIN SYNERGY SENSITIVE Sensitive     TETRACYCLINE Value in next row Resistant      RESISTANT>=16    * HEAVY GROWTH ENTEROCOCCUS SPECIES  MRSA PCR Screening     Status: None   Collection Time: 08/07/2015  2:38 PM  Result Value Ref Range Status   MRSA by PCR NEGATIVE  NEGATIVE Final    Comment:        The GeneXpert MRSA Assay (FDA approved for  NASAL specimens only), is one component of a comprehensive MRSA colonization surveillance program. It is not intended to diagnose MRSA infection nor to guide or monitor treatment for MRSA infections.   Blood culture (single)     Status: None   Collection Time: Sep 06, 2015  3:36 PM  Result Value Ref Range Status   Specimen Description BLOOD RIGHT ASSIST CONTROL  Final   Special Requests BOTTLES DRAWN AEROBIC AND ANAEROBIC  Final   Culture NO GROWTH 5 DAYS  Final   Report Status 08/17/2015 FINAL  Final  Wound culture     Status: None (Preliminary result)   Collection Time: 08/13/15 12:37 PM  Result Value Ref Range Status   Specimen Description WOUND  Final   Special Requests Normal  Final   Gram Stain   Final    FEW WBC SEEN TOO NUMEROUS TO COUNT GRAM NEGATIVE RODS FEW GRAM POSITIVE COCCI    Culture   Final    HEAVY GROWTH ESCHERICHIA COLI MODERATE GROWTH PROTEUS MIRABILIS LIGHT GROWTH KLEBSIELLA PNEUMONIAE MODERATE GROWTH ENTEROCOCCUS GALLINARUM CRITICAL RESULT CALLED TO, READ BACK BY AND VERIFIED WITH: [facility] BORBA AT 1042 08/16/15 DV    Report Status PENDING  Incomplete   Organism ID, Bacteria ESCHERICHIA COLI  Final   Organism ID, Bacteria PROTEUS MIRABILIS  Final   Organism ID, Bacteria KLEBSIELLA PNEUMONIAE  Final   Organism ID, Bacteria ENTEROCOCCUS GALLINARUM  Final      Susceptibility   Escherichia coli - MIC*    AMPICILLIN >=32 RESISTANT Resistant     CEFTAZIDIME 4 RESISTANT Resistant     CEFAZOLIN >=64 RESISTANT Resistant     CEFTRIAXONE 16 RESISTANT Resistant     GENTAMICIN 2 SENSITIVE Sensitive     IMIPENEM >=16 RESISTANT Resistant     TRIMETH/SULFA <=20 SENSITIVE Sensitive     Extended ESBL POSITIVE Resistant     PIP/TAZO Value in next row Resistant      RESISTANT>=128    CIPROFLOXACIN Value in next row Sensitive      SENSITIVE<=0.25    * HEAVY GROWTH ESCHERICHIA COLI    Klebsiella pneumoniae - MIC*    AMPICILLIN Value in next row Resistant      SENSITIVE<=0.25    CEFTAZIDIME Value in next row Resistant      SENSITIVE<=0.25    CEFAZOLIN Value in next row Resistant      SENSITIVE<=0.25    CEFTRIAXONE Value in next row Resistant      SENSITIVE<=0.25    CIPROFLOXACIN Value in next row Resistant      SENSITIVE<=0.25    GENTAMICIN Value in next row Sensitive      SENSITIVE<=0.25    IMIPENEM Value in next row Resistant      SENSITIVE<=0.25    TRIMETH/SULFA Value in next row Resistant      SENSITIVE<=0.25    PIP/TAZO Value in next row Resistant      RESISTANT>=128    * LIGHT GROWTH KLEBSIELLA PNEUMONIAE   Proteus mirabilis - MIC*    AMPICILLIN Value in next row Resistant      RESISTANT>=128    CEFTAZIDIME Value in next row Sensitive      RESISTANT>=128    CEFAZOLIN Value in next row Sensitive      RESISTANT>=128    CEFTRIAXONE Value in next row Sensitive      RESISTANT>=128    CIPROFLOXACIN Value in next row Sensitive      RESISTANT>=128    GENTAMICIN Value in next row Sensitive  RESISTANT>=128    IMIPENEM Value in next row Sensitive      RESISTANT>=128    TRIMETH/SULFA Value in next row Sensitive      RESISTANT>=128    PIP/TAZO Value in next row Sensitive      SENSITIVE<=4    * MODERATE GROWTH PROTEUS MIRABILIS   Enterococcus gallinarum - MIC*    AMPICILLIN Value in next row Resistant      SENSITIVE<=4    GENTAMICIN SYNERGY Value in next row Sensitive      SENSITIVE<=4    CIPROFLOXACIN Value in next row Resistant      RESISTANT>=8    TETRACYCLINE Value in next row Resistant      RESISTANT>=16    * MODERATE GROWTH ENTEROCOCCUS GALLINARUM  Wound culture     Status: None (Preliminary result)   Collection Time: 08/15/15  2:53 PM  Result Value Ref Range Status   Specimen Description WOUND  Final   Special Requests NONE  Final   Gram Stain PENDING  Incomplete   Culture NO GROWTH < 24 HOURS  Final   Report Status PENDING  Incomplete     Coagulation Studies:  Recent Labs  08/16/15 1323  LABPROT 18.1*  INR 1.48    Urinalysis: No results for input(s): COLORURINE, LABSPEC, PHURINE, GLUCOSEU, HGBUR, BILIRUBINUR, KETONESUR, PROTEINUR, UROBILINOGEN, NITRITE, LEUKOCYTESUR in the last 72 hours.  Invalid input(s): APPERANCEUR    Imaging: Dg Abd 1 View  08/15/2015   CLINICAL DATA:  OG tube placement  EXAM: ABDOMEN - 1 VIEW  COMPARISON:  06/15/2015  FINDINGS: OG tube tip is in the distal stomach. Nonobstructive bowel gas pattern. Linear high density areas throughout the abdomen, likely peritoneal calcifications. No visible free air.  IMPRESSION: OG tube tip in the distal stomach.   Electronically Signed   By: Charlett Nose M.D.   On: 08/15/2015 10:12     Medications:   . feeding supplement (VITAL 1.5 CAL) 1,000 mL (08/16/15 1318)  . norepinephrine (LEVOPHED) Adult infusion 1 mcg/min (08/13/15 1637)   . sodium chloride   Intravenous Once  . sodium chloride   Intravenous Once  . DAPTOmycin (CUBICIN)  IV  6 mg/kg Intravenous Q24H  . epoetin (EPOGEN/PROCRIT) injection  4,000 Units Intravenous Q T,Th,Sa-HD  . feeding supplement (PRO-STAT SUGAR FREE 64)  30 mL Per Tube TID  . free water  100 mL Per Tube 3 times per day  . insulin aspart  0-15 Units Subcutaneous 4 times per day  . meropenem (MERREM) IV  500 mg Intravenous Once  . meropenem (MERREM) IV  500 mg Intravenous Q24H  . potassium chloride  10 mEq Intravenous Q1 Hr x 4  . sodium chloride  3 mL Intravenous Q12H  . sodium hypochlorite   Irrigation TID   sodium chloride, sodium chloride, alteplase, lidocaine (PF), lidocaine-prilocaine, pentafluoroprop-tetrafluoroeth  Assessment/ Plan:  49 y.o. 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  1. End-stage renal disease on hemodialysis on HD TTHS. The patient has been on dialysis since October of 2014.  R IJ permcath.  -plan for HD tomorrow   2. Anemia of CKD:  received blood transfusion this admission. - continue EPO  3. Secondary hyperparathyroidism: phos low at 1.6. Replaced per icu protocol  4. Septic shock: Multiple potential sources, has two catheters in place, also has a sacral decubitis, and also has trach in place.  - blood cultures thus far negative.    - multiple organisms growing via wound culture - appreciate ID recommendations  5. peripheral edema - UF with HD with iv albumin support as tolerated   6.  Thrombocytopenia:  Platelets declining, defer further work up to [employer] and hospitalist.  pack catheter with citrate  patient's husband - John   LOS: 5 Habeeb Puertas 9/28/20168:42 AM

## 2015-08-17 NOTE — Progress Notes (Signed)
ELECTROLYTE MANAGEMENT - INITIAL  Pharmacy Consult for Electrolyte Management Indication: hypokalemia  Allergies  Allergen Reactions  . Contrast Media [Iodinated Diagnostic Agents] Anaphylaxis  . Ampicillin Rash    Patient Measurements: Height:  (175.3 cm) Weight: 181 lb (82.1 kg) IBW/kg (Calculated) : 66.2   Vital Signs: Temp: 97.9 F (36.6 C) (09/28 1100) Temp Source: Axillary (09/28 1100) BP: 107/58 mmHg (09/28 0600) Pulse Rate: 94 (09/28 0600) Intake/Output from previous day: 09/27 0701 - 09/28 0700 In: 1313 [I.V.:13; NG/GT:1200; IV Piggyback:100] Out: 1375 [Stool:375] Intake/Output from this shift: Total I/O In: 693 [I.V.:43; NG/GT:300; IV Piggyback:350] Out: -   Labs:  Recent Labs  08/15/15 0530 08/16/15 0422 08/16/15 1323 08/17/15 0446  WBC 14.9* 9.4  --  6.7  HGB 9.1* 8.5*  --  8.3*  HCT 27.8* 26.3*  --  25.5*  PLT 21* 15*  --  17*  APTT  --   --  82*  --   INR  --   --  1.48  --      Recent Labs  08/15/15 0530 08/16/15 0422 08/17/15 0446 08/17/15 1350  NA 141 142 141 142  K 4.0 3.8 3.0* 3.8  CL 104 105 105 104  CO2 32 31 31 33*  GLUCOSE 76 234* 148* 118*  BUN 17 25* 18 23*  CREATININE 1.11* 1.45* 0.94 1.10*  CALCIUM 7.8* 7.6* 7.2* 7.6*  MG  --  1.7 1.7  --   PHOS 1.6* 3.4  --   --    Estimated Creatinine Clearance: 70.1 mL/min (by C-G formula based on Cr of 1.1).    Recent Labs  08/16/15 1725 08/16/15 2209 08/17/15 0522  GLUCAP 121* 108* 148*    Medical History: Past Medical History  Diagnosis Date  . Obesity   . Dyslipidemia   . Hypertension   . Coronary artery disease     s/p BMS 2010 LAD  . Dysrhythmia     ventricular tachycardia resolved after LAD stent and beta blocker  . Diabetes mellitus     with retinopathy, neuropathy and microalbuminemia  . ESRD (end stage renal disease) on dialysis     Medications:  Scheduled:  . sodium chloride   Intravenous Once  . sodium chloride   Intravenous Once  . [START ON  08/18/2015] DAPTOmycin (CUBICIN)  IV  6 mg/kg Intravenous Q48H  . epoetin (EPOGEN/PROCRIT) injection  4,000 Units Intravenous Q T,Th,Sa-HD  . feeding supplement (PRO-STAT SUGAR FREE 64)  30 mL Per Tube TID  . free water  100 mL Per Tube 3 times per day  . insulin aspart  0-15 Units Subcutaneous 4 times per day  . meropenem (MERREM) IV  500 mg Intravenous Q24H  . sodium chloride  3 mL Intravenous Q12H  . sodium hypochlorite   Irrigation TID    Assessment: 49 yo female admitted for sepsis, found to have hypokalemia. Pharmacy consulted for electrolyte management.  Plan:  K is wnl so no need for further replacement. Ca corrects to  9.5 (albumin 1.6 9/23). Will f/u AM labs.   Luisa Hart D, Pharm.D. Clinical Pharmacist 08/17/2015,2:24 PM

## 2015-08-17 NOTE — Progress Notes (Signed)
[facility]  [phone] [phone]  ID: Courtney [name] OB: 1966-03-28  MR#: [id number]  GEX#:[id number]  Patient Care Team: Deatra James, MD as PCP - General (Family Medicine)  CHIEF COMPLAINT:  Chief Complaint  Patient presents with  . unresponsive     INTERVAL HISTORY: Patient remains critically ill and lethargic. Platelets have remained stable. No family is at bedside.  REVIEW OF SYSTEMS:   Review of Systems  Unable to perform ROS: critical illness    As per HPI. Otherwise, a complete review of systems is negatve.  PAST MEDICAL HISTORY: Past Medical History  Diagnosis Date  . Obesity   . Dyslipidemia   . Hypertension   . Coronary artery disease     s/p BMS 2010 LAD  . Dysrhythmia     ventricular tachycardia resolved after LAD stent and beta blocker  . Diabetes mellitus     with retinopathy, neuropathy and microalbuminemia  . ESRD (end stage renal disease) on dialysis     PAST SURGICAL HISTORY: Past Surgical History  Procedure Laterality Date  . Cholecystectomy  1990  . Pci lad  12/2010  . Colonoscopy  08/2011  . Left heart catheterization with coronary angiogram N/A 01/28/2012    Procedure: LEFT HEART CATHETERIZATION WITH CORONARY ANGIOGRAM;  Surgeon: Lesleigh Noe, MD;  Location: [facility] CATH LAB;  Service: Cardiovascular;  Laterality: N/A;  . Tracheostomy    . Gastric bypass      FAMILY HISTORY Family History  Problem Relation Age of Onset  . Hypertension Father   . Diabetes Father   . Cancer Mother   . Diabetes Paternal Grandfather   . Hypertension Paternal Grandfather   . Diabetes Paternal Grandmother   . Hypertension Paternal Grandmother   . Cancer Maternal Grandmother     colon ca       ADVANCED DIRECTIVES:    HEALTH MAINTENANCE: Social History  Substance Use Topics  . Smoking status: Never Smoker   . Smokeless tobacco: Never Used  . Alcohol Use: No     Comment: occassional      Colonoscopy:  PAP:  Bone density:  Lipid panel:  Allergies  Allergen Reactions  . Contrast Media [Iodinated Diagnostic Agents] Anaphylaxis  . Ampicillin Rash    Current Facility-Administered Medications  Medication Dose Route Frequency Provider Last Rate Last Dose  . 0.9 %  sodium chloride infusion   Intravenous Once Alford Highland, MD   Stopped at 08/19/2015 1452  . 0.9 %  sodium chloride infusion  100 mL Intravenous PRN Munsoor Lateef, MD      . 0.9 %  sodium chloride infusion  100 mL Intravenous PRN Munsoor Lateef, MD      . 0.9 %  sodium chloride infusion   Intravenous Once Ramonita Lab, MD      . alteplase (CATHFLO ACTIVASE) injection 2 mg  2 mg Intracatheter Once PRN Munsoor Lateef, MD      . Melene Muller ON 08/18/2015] DAPTOmycin (CUBICIN) 492.5 mg in sodium chloride 0.9 % IVPB  6 mg/kg Intravenous Q48H Clydie Braun, MD      . epoetin alfa (EPOGEN,PROCRIT) injection 4,000 Units  4,000 Units Intravenous Q T,Th,Sa-HD Mosetta Pigeon, MD   4,000 Units at 08/16/15 1004  . feeding supplement (PRO-STAT SUGAR FREE 64) liquid 30 mL  30 mL Per Tube TID Shane Crutch, MD   30 mL at 08/17/15 1053  . feeding supplement (VITAL 1.5 CAL) liquid 1,000 mL  1,000 mL Per Tube Continuous Shane Crutch,  MD 50 mL/hr at 08/17/15 0700 1,000 mL at 08/17/15 0700  . free water 100 mL  100 mL Per Tube 3 times per day Shane Crutch, MD   100 mL at 08/17/15 0600  . insulin aspart (novoLOG) injection 0-15 Units  0-15 Units Subcutaneous 4 times per day Stephanie Acre, MD   2 Units at 08/17/15 1207  . lidocaine (PF) (XYLOCAINE) 1 % injection 5 mL  5 mL Intradermal PRN Munsoor Lateef, MD      . lidocaine-prilocaine (EMLA) cream 1 application  1 application Topical PRN Munsoor Lateef, MD      . meropenem (MERREM) 500 mg in sodium chloride 0.9 % 50 mL IVPB  500 mg Intravenous Q24H Clydie Braun, MD      . pentafluoroprop-tetrafluoroeth (GEBAUERS) aerosol 1 application  1 application Topical PRN  Munsoor Lateef, MD      . potassium chloride 10 mEq in 100 mL IVPB  10 mEq Intravenous Q1 Hr x 4 Aruna Gouru, MD   10 mEq at 08/17/15 1207  . sodium chloride 0.9 % injection 3 mL  3 mL Intravenous Q12H Alford Highland, MD   3 mL at 08/17/15 1000  . sodium hypochlorite (DAKIN'S 1/4 STRENGTH) topical solution   Irrigation TID Ida Rogue, MD   1 application at 08/17/15 1058    OBJECTIVE: Filed Vitals:   08/17/15 1100  BP:   Pulse:   Temp: 97.9 F (36.6 C)  Resp:      Body mass index is 26.72 kg/(m^2).    ECOG FS:4 - Bedbound  General: Critically ill-appearing, no acute distress. HEENT: Trach tube in place. Lungs: Clear to auscultation bilaterally. Heart: Regular rate and rhythm. No rubs, murmurs, or gallops. Abdomen: Soft, nontender, nondistended. No organomegaly noted, normoactive bowel sounds. Musculoskeletal: No edema, cyanosis, or clubbing. Neuro: Lethargic, difficult to arouse. Skin: No rashes or petechiae noted. Psych: Lethargic.   LAB RESULTS:  Lab Results  Component Value Date   NA 141 08/17/2015   K 3.0* 08/17/2015   CL 105 08/17/2015   CO2 31 08/17/2015   GLUCOSE 148* 08/17/2015   BUN 18 08/17/2015   CREATININE 0.94 08/17/2015   CALCIUM 7.2* 08/17/2015   PROT 4.4* 2015/08/19   ALBUMIN 1.6* 19-Aug-2015   AST 16 08/19/2015   ALT 13* 08-19-15   ALKPHOS 175* 08/19/15   BILITOT 0.9 Aug 19, 2015   GFRNONAA >60 08/17/2015   GFRAA >60 08/17/2015    Lab Results  Component Value Date   WBC 6.7 08/17/2015   NEUTROABS 5.4 08/17/2015   HGB 8.3* 08/17/2015   HCT 25.5* 08/17/2015   MCV 92.6 08/17/2015   PLT 17* 08/17/2015     STUDIES: Dg Chest 1 View  08-19-15   CLINICAL DATA:  Hypotension. Unresponsive patient with tracheostomy in place.  EXAM: CHEST 1 VIEW  COMPARISON:  08/02/2015  FINDINGS: Cardiac silhouette is top-normal in size. No mediastinal or hilar masses or evidence of adenopathy. Lungs are clear. No pleural effusion or pneumothorax.   Right internal jugular dual-lumen central venous catheter is stable with its distal tip in the right atrium. Left internal jugular small caliber dual-lumen central venous catheter is stable with its tip in the mid superior vena cava.  Tracheostomy tube is well positioned and also stable.  IMPRESSION: 1. Tracheostomy tube and central venous lines are stable as detailed above. 2. No acute cardiopulmonary disease.   Electronically Signed   By: Amie Portland M.D.   On: 08-19-15 09:24   Dg Abd 1 View  08/15/2015  CLINICAL DATA:  OG tube placement  EXAM: ABDOMEN - 1 VIEW  COMPARISON:  06/15/2015  FINDINGS: OG tube tip is in the distal stomach. Nonobstructive bowel gas pattern. Linear high density areas throughout the abdomen, likely peritoneal calcifications. No visible free air.  IMPRESSION: OG tube tip in the distal stomach.   Electronically Signed   By: Charlett Nose M.D.   On: 08/15/2015 10:12   Ct Head Wo Contrast  09/10/15   CLINICAL DATA:  Unresponsive patient.  Leftward gaze.  EXAM: CT HEAD WITHOUT CONTRAST  TECHNIQUE: Contiguous axial images were obtained from the base of the skull through the vertex without intravenous contrast.  COMPARISON:  MRI 01/28/2012.  FINDINGS: No mass lesion, mass effect, midline shift, hydrocephalus, hemorrhage. No territorial ischemia or acute infarction. Unchanged small dilated perivascular spaces are noted in the basal ganglia.  IMPRESSION: Negative CT head.   Electronically Signed   By: Andreas Newport M.D.   On: 09/10/15 10:11   Dg Chest Port 1 View  08/13/2015   CLINICAL DATA:  Respiratory failure  EXAM: PORTABLE CHEST 1 VIEW  COMPARISON:  2015-09-10  FINDINGS: Support devices are unchanged. Cardiomegaly with vascular congestion. Left lower lobe atelectasis or infiltrate has increased. Suspect small left effusion. No confluent opacity on the right.  IMPRESSION: Increasing left lower lobe atelectasis or infiltrate with small left effusion.   Electronically Signed    By: Charlett Nose M.D.   On: 08/13/2015 09:31   Dg Chest Port 1 View  08/02/2015   CLINICAL DATA:  Respiratory failure.  EXAM: PORTABLE CHEST - 1 VIEW  COMPARISON:  07/17/2015.  FINDINGS: Tracheostomy tube, left IJ line, dual-lumen right IJ line in stable position. Mediastinum and hilar structures are stable. Low lung volumes with mild bibasilar atelectasis. Stable cardiomegaly. No pulmonary venous congestion. No pleural effusion or pneumothorax. Abdominal calcifications are again noted. No acute bony abnormality.  IMPRESSION: 1. Lines and tubes in stable position. 2. Low lung volumes with mild bibasilar subsegmental atelectasis. 3. Stable cardiomegaly.  No pulmonary venous congestion.   Electronically Signed   By: Maisie Fus  Register   On: 08/02/2015 08:01    ASSESSMENT: Thrombocytopenia in critically ill patient.  PLAN:    1. Thrombocytopenia: Despite patient's multiple medical problems her platelet count was within normal limits as recently as August 03, 2015. Her platelet count continues to be decreased, but stable at 17. No obvious signs of bleeding. This is likely multifactorial due to her critical illness or possibly medication induced. HITT panel is currently pending. There is no evidence of DIC. Continue to monitor daily platelet count. Transfuse if platelets falls below 10,000 or if there is evidence of bleeding. 2. Anemia: Patient likely receiving Procrit with dialysis. Improved blood transfusion and now 9.1. Continue to monitor and  transfuse if hemoglobin falls below 7.0.   Will follow.   Jeralyn Ruths, MD   08/17/2015 1:03 PM

## 2015-08-17 NOTE — Progress Notes (Signed)
Pharmacy Consult for Daptomycin/Meropenem Indication: Sepsis/Sacral decubitus  Allergies  Allergen Reactions  . Contrast Media [Iodinated Diagnostic Agents] Anaphylaxis  . Ampicillin Rash    Patient Measurements: Height:  (175.3 cm) Weight: 181 lb (82.1 kg) IBW/kg (Calculated) : 66.2   Vital Signs: Temp: 98 F (36.7 C) (09/28 0700) Temp Source: Axillary (09/28 0700) BP: 107/58 mmHg (09/28 0600) Pulse Rate: 94 (09/28 0600) Intake/Output from previous day: 09/27 0701 - 09/28 0700 In: 1303 [I.V.:3; NG/GT:1200; IV Piggyback:100] Out: 1375 [Stool:375] Intake/Output from this shift: Total I/O In: 150 [IV Piggyback:150] Out: -   Labs:  Recent Labs  08/15/15 0530 08/16/15 0422 08/17/15 0446  WBC 14.9* 9.4 6.7  HGB 9.1* 8.5* 8.3*  PLT 21* 15* 17*  CREATININE 1.11* 1.45* 0.94   Estimated Creatinine Clearance: 82.1 mL/min (by C-G formula based on Cr of 0.94). No results for input(s): VANCOTROUGH, VANCOPEAK, VANCORANDOM, GENTTROUGH, GENTPEAK, GENTRANDOM, TOBRATROUGH, TOBRAPEAK, TOBRARND, AMIKACINPEAK, AMIKACINTROU, AMIKACIN in the last 72 hours.   Microbiology: Recent Results (from the past 720 hour(s))  Culture, blood (routine x 2)     Status: None   Collection Time: 08/01/15  7:00 PM  Result Value Ref Range Status   Specimen Description BLOOD RIGHT ANTECUBITAL  Final   Special Requests BOTTLES DRAWN AEROBIC ONLY 5CC  Final   Culture NO GROWTH 5 DAYS  Final   Report Status 08/06/2015 FINAL  Final  Culture, blood (routine x 2)     Status: None   Collection Time: 08/01/15  7:10 PM  Result Value Ref Range Status   Specimen Description BLOOD RIGHT HAND  Final   Special Requests IN PEDIATRIC BOTTLE 3CC  Final   Culture  Setup Time   Final    GRAM POSITIVE COCCI IN CLUSTERS AEROBIC BOTTLE ONLY CRITICAL RESULT CALLED TO, READ BACK BY AND VERIFIED WITH: D TAYLOR,RN AT 1115 08/02/15 BY L BENFIELD    Culture STAPHYLOCOCCUS SPECIES (COAGULASE NEGATIVE)  Final   Report  Status 08/06/2015 FINAL  Final   Organism ID, Bacteria STAPHYLOCOCCUS SPECIES (COAGULASE NEGATIVE)  Final      Susceptibility   Staphylococcus species (coagulase negative) - MIC*    CIPROFLOXACIN >=8 RESISTANT Resistant     ERYTHROMYCIN <=0.25 SENSITIVE Sensitive     GENTAMICIN 8 INTERMEDIATE Intermediate     OXACILLIN >=4 RESISTANT Resistant     TETRACYCLINE <=1 SENSITIVE Sensitive     VANCOMYCIN 1 SENSITIVE Sensitive     TRIMETH/SULFA 160 RESISTANT Resistant     CLINDAMYCIN <=0.25 SENSITIVE Sensitive     RIFAMPIN <=0.5 SENSITIVE Sensitive     Inducible Clindamycin NEGATIVE Sensitive     * STAPHYLOCOCCUS SPECIES (COAGULASE NEGATIVE)  Culture, blood (routine x 2)     Status: None   Collection Time: 08/02/15  7:00 PM  Result Value Ref Range Status   Specimen Description BLOOD RIGHT ANTECUBITAL  Final   Special Requests BOTTLES DRAWN AEROBIC ONLY 10CC  Final   Culture  Setup Time   Final    GRAM POSITIVE COCCI IN CLUSTERS AEROBIC BOTTLE ONLY CRITICAL RESULT CALLED TO, READ BACK BY AND VERIFIED WITH: DR Gwenevere Abbot RN 1549 08/03/15 A BROWNING    Culture   Final    STAPHYLOCOCCUS SPECIES (COAGULASE NEGATIVE) SUSCEPTIBILITIES PERFORMED ON PREVIOUS CULTURE WITHIN THE LAST 5 DAYS.    Report Status 08/06/2015 FINAL  Final  Culture, blood (routine x 2)     Status: None   Collection Time: 08/02/15  7:05 PM  Result Value Ref Range Status  Specimen Description BLOOD RIGHT HAND  Final   Special Requests BOTTLES DRAWN AEROBIC AND ANAEROBIC 10CC  Final   Culture NO GROWTH 5 DAYS  Final   Report Status 08/07/2015 FINAL  Final  Blood Culture (routine x 2)     Status: None   Collection Time: 08/02/2015  8:51 AM  Result Value Ref Range Status   Specimen Description BLOOD UNKO  Final   Special Requests BOTTLES DRAWN AEROBIC AND ANAEROBIC  3CC  Final   Culture NO GROWTH 5 DAYS  Final   Report Status 08/17/2015 FINAL  Final  Blood Culture (routine x 2)     Status: None   Collection Time: 07/26/2015   9:20 AM  Result Value Ref Range Status   Specimen Description BLOOD LEFT ARM  Final   Special Requests BOTTLES DRAWN AEROBIC AND ANAEROBIC  1CC  Final   Culture NO GROWTH 5 DAYS  Final   Report Status 08/17/2015 FINAL  Final  Wound culture     Status: None   Collection Time: 08/11/2015  9:20 AM  Result Value Ref Range Status   Specimen Description DECUBITIS  Final   Special Requests Normal  Final   Gram Stain   Final    FEW WBC SEEN MANY GRAM NEGATIVE RODS RARE GRAM POSITIVE COCCI    Culture   Final    HEAVY GROWTH ESCHERICHIA COLI MODERATE GROWTH ENTEROBACTER AEROGENES PROTEUS MIRABILIS HEAVY GROWTH ENTEROCOCCUS SPECIES VRE HAVE INTRINSIC RESISTANCE TO MOST COMMONLY USED ANTIBIOTICS AND THE ABILITY TO ACQUIRE RESISTANCE TO MOST AVAILABLE ANTIBIOTICS.    Report Status 08/16/2015 FINAL  Final   Organism ID, Bacteria ESCHERICHIA COLI  Final   Organism ID, Bacteria ENTEROBACTER AEROGENES  Final   Organism ID, Bacteria PROTEUS MIRABILIS  Final   Organism ID, Bacteria ENTEROCOCCUS SPECIES  Final      Susceptibility   Enterobacter aerogenes - MIC*    CEFTAZIDIME <=1 SENSITIVE Sensitive     CEFAZOLIN >=64 RESISTANT Resistant     CEFTRIAXONE <=1 SENSITIVE Sensitive     CIPROFLOXACIN <=0.25 SENSITIVE Sensitive     GENTAMICIN <=1 SENSITIVE Sensitive     IMIPENEM 1 SENSITIVE Sensitive     TRIMETH/SULFA <=20 SENSITIVE Sensitive     * MODERATE GROWTH ENTEROBACTER AEROGENES   Escherichia coli - MIC*    AMPICILLIN <=2 SENSITIVE Sensitive     CEFTAZIDIME <=1 SENSITIVE Sensitive     CEFAZOLIN <=4 SENSITIVE Sensitive     CEFTRIAXONE <=1 SENSITIVE Sensitive     CIPROFLOXACIN <=0.25 SENSITIVE Sensitive     GENTAMICIN <=1 SENSITIVE Sensitive     IMIPENEM <=0.25 SENSITIVE Sensitive     TRIMETH/SULFA <=20 SENSITIVE Sensitive     * HEAVY GROWTH ESCHERICHIA COLI   Proteus mirabilis - MIC*    AMPICILLIN >=32 RESISTANT Resistant     CEFTAZIDIME <=1 SENSITIVE Sensitive     CEFAZOLIN 8 SENSITIVE  Sensitive     CEFTRIAXONE <=1 SENSITIVE Sensitive     CIPROFLOXACIN <=0.25 SENSITIVE Sensitive     GENTAMICIN <=1 SENSITIVE Sensitive     IMIPENEM 1 SENSITIVE Sensitive     TRIMETH/SULFA <=20 SENSITIVE Sensitive     * PROTEUS MIRABILIS   Enterococcus species - MIC*    AMPICILLIN >=32 RESISTANT Resistant     VANCOMYCIN >=32 RESISTANT Resistant     GENTAMICIN SYNERGY SENSITIVE Sensitive     TETRACYCLINE Value in next row Resistant      RESISTANT>=16    * HEAVY GROWTH ENTEROCOCCUS SPECIES  MRSA PCR Screening  Status: None   Collection Time: 08-13-15  2:38 PM  Result Value Ref Range Status   MRSA by PCR NEGATIVE NEGATIVE Final    Comment:        The GeneXpert MRSA Assay (FDA approved for NASAL specimens only), is one component of a comprehensive MRSA colonization surveillance program. It is not intended to diagnose MRSA infection nor to guide or monitor treatment for MRSA infections.   Blood culture (single)     Status: None   Collection Time: 13-Aug-2015  3:36 PM  Result Value Ref Range Status   Specimen Description BLOOD RIGHT ASSIST CONTROL  Final   Special Requests BOTTLES DRAWN AEROBIC AND ANAEROBIC  Final   Culture NO GROWTH 5 DAYS  Final   Report Status 08/17/2015 FINAL  Final  Wound culture     Status: None (Preliminary result)   Collection Time: 08/13/15 12:37 PM  Result Value Ref Range Status   Specimen Description WOUND  Final   Special Requests Normal  Final   Gram Stain   Final    FEW WBC SEEN TOO NUMEROUS TO COUNT GRAM NEGATIVE RODS FEW GRAM POSITIVE COCCI    Culture   Final    HEAVY GROWTH ESCHERICHIA COLI MODERATE GROWTH PROTEUS MIRABILIS LIGHT GROWTH KLEBSIELLA PNEUMONIAE MODERATE GROWTH ENTEROCOCCUS GALLINARUM CRITICAL RESULT CALLED TO, READ BACK BY AND VERIFIED WITH: [facility] BORBA AT 1042 08/16/15 DV    Report Status PENDING  Incomplete   Organism ID, Bacteria ESCHERICHIA COLI  Final   Organism ID, Bacteria PROTEUS MIRABILIS  Final   Organism  ID, Bacteria KLEBSIELLA PNEUMONIAE  Final   Organism ID, Bacteria ENTEROCOCCUS GALLINARUM  Final      Susceptibility   Escherichia coli - MIC*    AMPICILLIN >=32 RESISTANT Resistant     CEFTAZIDIME 4 RESISTANT Resistant     CEFAZOLIN >=64 RESISTANT Resistant     CEFTRIAXONE 16 RESISTANT Resistant     GENTAMICIN 2 SENSITIVE Sensitive     IMIPENEM >=16 RESISTANT Resistant     TRIMETH/SULFA <=20 SENSITIVE Sensitive     Extended ESBL POSITIVE Resistant     PIP/TAZO Value in next row Resistant      RESISTANT>=128    CIPROFLOXACIN Value in next row Sensitive      SENSITIVE<=0.25    * HEAVY GROWTH ESCHERICHIA COLI   Klebsiella pneumoniae - MIC*    AMPICILLIN Value in next row Resistant      SENSITIVE<=0.25    CEFTAZIDIME Value in next row Resistant      SENSITIVE<=0.25    CEFAZOLIN Value in next row Resistant      SENSITIVE<=0.25    CEFTRIAXONE Value in next row Resistant      SENSITIVE<=0.25    CIPROFLOXACIN Value in next row Resistant      SENSITIVE<=0.25    GENTAMICIN Value in next row Sensitive      SENSITIVE<=0.25    IMIPENEM Value in next row Resistant      SENSITIVE<=0.25    TRIMETH/SULFA Value in next row Resistant      SENSITIVE<=0.25    PIP/TAZO Value in next row Resistant      RESISTANT>=128    * LIGHT GROWTH KLEBSIELLA PNEUMONIAE   Proteus mirabilis - MIC*    AMPICILLIN Value in next row Resistant      RESISTANT>=128    CEFTAZIDIME Value in next row Sensitive      RESISTANT>=128    CEFAZOLIN Value in next row Sensitive      RESISTANT>=128  CEFTRIAXONE Value in next row Sensitive      RESISTANT>=128    CIPROFLOXACIN Value in next row Sensitive      RESISTANT>=128    GENTAMICIN Value in next row Sensitive      RESISTANT>=128    IMIPENEM Value in next row Sensitive      RESISTANT>=128    TRIMETH/SULFA Value in next row Sensitive      RESISTANT>=128    PIP/TAZO Value in next row Sensitive      SENSITIVE<=4    * MODERATE GROWTH PROTEUS MIRABILIS    Enterococcus gallinarum - MIC*    AMPICILLIN Value in next row Resistant      SENSITIVE<=4    GENTAMICIN SYNERGY Value in next row Sensitive      SENSITIVE<=4    CIPROFLOXACIN Value in next row Resistant      RESISTANT>=8    TETRACYCLINE Value in next row Resistant      RESISTANT>=16    * MODERATE GROWTH ENTEROCOCCUS GALLINARUM  Wound culture     Status: None (Preliminary result)   Collection Time: 08/15/15  2:53 PM  Result Value Ref Range Status   Specimen Description WOUND  Final   Special Requests NONE  Final   Gram Stain PENDING  Incomplete   Culture NO GROWTH < 24 HOURS  Final   Report Status PENDING  Incomplete    Medical History: Past Medical History  Diagnosis Date  . Obesity   . Dyslipidemia   . Hypertension   . Coronary artery disease     s/p BMS 2010 LAD  . Dysrhythmia     ventricular tachycardia resolved after LAD stent and beta blocker  . Diabetes mellitus     with retinopathy, neuropathy and microalbuminemia  . ESRD (end stage renal disease) on dialysis     Medications:  Scheduled:  . sodium chloride   Intravenous Once  . sodium chloride   Intravenous Once  . [START ON 08/18/2015] DAPTOmycin (CUBICIN)  IV  6 mg/kg Intravenous Q48H  . epoetin (EPOGEN/PROCRIT) injection  4,000 Units Intravenous Q T,Th,Sa-HD  . feeding supplement (PRO-STAT SUGAR FREE 64)  30 mL Per Tube TID  . free water  100 mL Per Tube 3 times per day  . insulin aspart  0-15 Units Subcutaneous 4 times per day  . meropenem (MERREM) IV  500 mg Intravenous Q24H  . potassium chloride  10 mEq Intravenous Q1 Hr x 4  . sodium chloride  3 mL Intravenous Q12H  . sodium hypochlorite   Irrigation TID   Infusions:  . 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   Goal of Therapy:  Resolution of infection  Plan:  Continue daptomycin 6 mg/kg q 48 hours. Gentamicin d/c and meropenem to resume to will order meropenem 500 mg once this am (no dose yesterday) and continue with meropenem 500 mg iv q 24 hours from this PM.    Courtney [name] D 08/17/2015,9:44 AM

## 2015-08-17 NOTE — Progress Notes (Signed)
Nutrition Follow-up    INTERVENTION:   EN: recommend continuing current TF regimen at present including Prostat supplements.    NUTRITION DIAGNOSIS:   Inadequate oral intake related to wound healing, acute illness, chronic illness as evidenced by NPO status, estimated needs.  GOAL:   Patient will meet greater than or equal to 90% of their needs  MONITOR:    (Energy Intake, Anthropometrics, Digestive System, Electrolyte/Renal Profile, Glucose Profile)  REASON FOR ASSESSMENT:   Consult Enteral/tube feeding initiation and management  ASSESSMENT:    Pt remains on trach collar, opens eyes but does not follow commands, 1L fluid removed with HD yesterday   Diet Order:   NPO  EN: tolerating Vital 1.5 TF at goal rate via dobhoff  Electrolyte and Renal Profile:  Recent Labs Lab 08/13/15 0250  08/14/15 1203 08/15/15 0530 08/16/15 0422 08/17/15 0446  BUN 19  < >  --  17 25* 18  CREATININE 1.41*  < >  --  1.11* 1.45* 0.94  NA 138  < >  --  141 142 141  K 3.4*  < >  --  4.0 3.8 3.0*  MG 1.8  --   --   --  1.7 1.7  PHOS  --   --  1.6* 1.6* 3.4  --   < > = values in this interval not displayed. 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  Estimated Nutritional Needs:   Kcal:  1610-9604 kcals (BEE 1434, 1.2 AF, 1.2-1.4 IF)   Protein:  113-150 g (1.5-2.0 g/kg)   Fluid:  1000 mL plus UOP  HIGH Care Level  Romelle Starcher MS, RD, LDN [phone] Pager

## 2015-08-17 NOTE — Progress Notes (Signed)
PULMONARY / CRITICAL CARE MEDICINE   Name: Courtney [name] MRN: [id number] DOB: 1966/04/11    ADMISSION DATE:  09/06/2015 CONSULTATION DATE: 9/23  INITIAL PRESENTATION:  51 F who has been in medical facilities (hosp, LTAC, rehab) for 2 yrs following gastric bypass surgery with multiple complications. Now with chronic trach, ESRD, profound debilitation, severe sacral pressure ulcer. Was seemingly making progress and transferred to rehab facility approx one week prior to this admission. She was sent to [facility] ED with AMS and hypotension. Working dx of severe sepsis/septic shock due to infected sacral pressure ulcer. Also has R side externalized HD cath and tunneled L IJ CVL. She has history of resistant bacterial infections. She is on chronic steroids. Her husband indicates that she has been diagnosed with adrenal insuff at some point during the past 2 yrs  MAJOR EVENTS/TEST RESULTS: 9/23 CT head: NAD 9/23 EEG: no epileptiform activity 9/23 PRBCs for Hgb 6.4 9/24 bedside debridement of sacral wound. Abscess drained 9/25 Off vasopressors. More alert. No distress. Worsening thrombocytopenia. Vanc DC'd  INDWELLING DEVICES:: Trach (chronic)  Tunneled R IJ HD cath (chronic) Tunneled L IJ CVL (chronic) L femoral A-line 9/23 >> 9/25  MICRO DATA: MRSA PCR 9/23 >> NEG Urine 9/23 >>  Wound (swab) 9/23 >> many GNR, rare GPC >>  Blood 9/23 >>  Wound (debridement) 9/24 >> heavy GNR >> CRE, Enterococcus, K. Pneumoniae, P. Mirabilis, VRE CDiff 9/27>>   ANTIMICROBIALS:  Aztreonam 9/23 >> 9/24 Vanc 9/23 >> 9/25 Vanc 9/26>>9/27 Meropenem 9/23 >>  Daptomycin 9/27>>   SUBJECTIVE:  RASS, -1.  Nothing purposeful at this time, off pressors, BP stable. On Trach collar at 28%, mild thin secretions.  Open eyes, but not tracking Plt count still dropping.  Updated husband on status  VITAL SIGNS: Temp:  [97.8 F (36.6 C)-98.8 F (37.1 C)] 98.8 F (37.1 C) (09/28 0500) Pulse Rate:  [87-112]  94 (09/28 0600) Resp:  [12-28] 16 (09/28 0600) BP: (88-140)/(41-79) 107/58 mmHg (09/28 0600) SpO2:  [100 %] 100 % (09/28 0600) FiO2 (%):  [28 %] 28 % (09/28 0730) Weight:  [181 lb (82.1 kg)-184 lb 1.4 oz (83.5 kg)] 181 lb (82.1 kg) (09/27 1300) HEMODYNAMICS:   VENTILATOR SETTINGS: Vent Mode:  [-]  FiO2 (%):  [28 %] 28 % INTAKE / OUTPUT:  Intake/Output Summary (Last 24 hours) at 08/17/15 0830 Last data filed at 08/17/15 0500  Gross per 24 hour  Intake   1153 ml  Output   1175 ml  Net    -22 ml    PHYSICAL EXAMINATION: General: RASS 0, -1, + F/C, no respiratory distress Neuro: CNs intact, MAEs, DTRs symmetric HEENT: Cushingoid facies Neck: no JVD noted, trach site clean Cardiovascular: Regular, no M noted Lungs: No adventitious sounds Abdomen: Obese, soft, NT, diminished BS Ext: cool, diminished distal pulses, no edema Skin: very deep, malodorous sacral pressure ulcer  LABS:  CBC  Recent Labs Lab 08/15/15 0530 08/16/15 0422 08/17/15 0446  WBC 14.9* 9.4 6.7  HGB 9.1* 8.5* 8.3*  HCT 27.8* 26.3* 25.5*  PLT 21* 15* 17*   Coag's  Recent Labs Lab 08/16/15 1323  APTT 82*  INR 1.48   BMET  Recent Labs Lab 08/15/15 0530 08/16/15 0422 08/17/15 0446  NA 141 142 141  K 4.0 3.8 3.0*  CL 104 105 105  CO2 32 31 31  BUN 17 25* 18  CREATININE 1.11* 1.45* 0.94  GLUCOSE 76 234* 148*   Electrolytes  Recent Labs Lab 08/13/15 0250  08/14/15 1203 08/15/15 0530 08/16/15 0422 08/17/15 0446  CALCIUM 7.5*  < >  --  7.8* 7.6* 7.2*  MG 1.8  --   --   --  1.7 1.7  PHOS  --   --  1.6* 1.6* 3.4  --   < > = values in this interval not displayed. 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 HD 9/27  GASTROINTESTINAL A:  Loose stool - back on TF  Hx of C. diff P:   SUP: N/I unless requires vent support Begin D III diet with supervision 9/25 Check c.diff PCR  HEMATOLOGIC A:   Acute on chronic anemia - unclear if acute blood loss Thrombocytopenia, worsening. Etiology unclear. Was on heparin PTA P:  DVT px: SCDs Monitor CBC intermittently Transfuse per usual guidelines Check HIT panel 9/25 Might need platelet transfusion prior to next debridement  INFECTIOUS A:   Severe sepsis Sacral decubitus ulcer with abscess P:   Monitor temp, WBC count Micro and abx as above Surgery following ID recs appreciated - multiple MDR organisms, but improving slowly with current antibiotic selection, we require extensive wound care.   ENDOCRINE A:  DM 2, controlled Chronic prednisone therapy - chronic dose 10 mg BID Secondary adrenal insuff P:   CBGs and SSI - mod scale Stress dose steroids - wean as able based on BP  NEUROLOGIC A:   Acute encephalopathy, resolving-somnolent still P:   RASS goal: 0 Minimize sedating meds - baseline  mentation is talking and following commands.   FAMILY:  - updated husband on current status - husband requested that when patient is more stable that she be transferred back to [facility]  I have personally obtained a history, examined the patient, evaluated laboratory and imaging results, formulated the assessment and plan and placed orders. CRITICAL CARE: The patient is critically ill with multiple organ systems failure and requires high complexity decision making for assessment and support, frequent evaluation and titration of therapies, application of advanced monitoring technologies and extensive interpretation of multiple databases. Critical Care Time devoted to patient care services described in this note is 40 minutes.    Stephanie Acre, MD [facility] Pager [phone] (please enter 7-digits) On Call Pager - [phone] (please enter 7-digits)  08/17/2015, 8:30 AM

## 2015-08-17 NOTE — Progress Notes (Signed)
[facility] [place] [facility]   PATIENT NAME: Courtney [name]    MR#:  [id number]  DATE OF BIRTH:  1966/03/10  SUBJECTIVE:  CHIEF COMPLAINT:  Patient is encephalopathic , not opening her eyes to verbal commands but not communicating.  pt on tube feeds - Vital , flexiseal   REVIEW OF SYSTEMS:  Unable to obtain review of systems DRUG ALLERGIES:   Allergies  Allergen Reactions  . Contrast Media [Iodinated Diagnostic Agents] Anaphylaxis  . Ampicillin Rash    VITALS:  Blood pressure 107/58, pulse 94, temperature 97.9 F (36.6 C), temperature source Axillary, resp. rate 16, height  (1.753 m), weight 82.1 kg (181 lb), SpO2 100 %.  PHYSICAL EXAMINATION:  GENERAL:  49 y.o.-year-old patient lying in the bed with no acute distress.  EYES: Pupils equal, round, reactive to light and accommodation No scleral icterus. HEENT:  Normocephalic. Oropharynx and nasopharynx clear. Trach site is intact NECK:  Supple, no jugular venous distention. No thyroid enlargement, no tenderness.  LUNGS: Moderate air entry, decreased breath sounds at the bases, no wheezing, rales,rhonchi or crepitation. No use of accessory muscles of respiration.  CARDIOVASCULAR: S1, S2 normal. No murmurs, rubs, or gallops.  ABDOMEN: Soft, nondistended. Bowel sounds present.  EXTREMITIES: No pedal edema, cyanosis, or clubbing.  NEUROLOGIC: Patient is with altered mental status  PSYCHIATRIC: The patient is disoriented SKIN: Large sacral decub with his ulcer . No obvious rash   LABORATORY PANEL:   CBC  Recent Labs Lab 08/17/15 0446  WBC 6.7  HGB 8.3*  HCT 25.5*  PLT 17*   ------------------------------------------------------------------------------------------------------------------  Chemistries   Recent Labs Lab 07/26/2015 0850  08/17/15 0446 08/17/15 1350  NA 141  < > 141 142  K 2.3*  < > 3.0* 3.8  CL 103  < > 105 104  CO2 30  < > 31 33*  GLUCOSE 87  < > 148*  118*  BUN 17  < > 18 23*  CREATININE 1.29*  < > 0.94 1.10*  CALCIUM 7.1*  < > 7.2* 7.6*  MG 1.4*  < > 1.7  --   AST 16  --   --   --   ALT 13*  --   --   --   ALKPHOS 175*  --   --   --   BILITOT 0.9  --   --   --   < > = values in this interval not displayed. ------------------------------------------------------------------------------------------------------------------  Cardiac Enzymes  Recent Labs Lab 07/23/2015 0850  TROPONINI 0.22*   ------------------------------------------------------------------------------------------------------------------  RADIOLOGY:  No results found.  EKG:   Orders placed or performed in visit on 08/11/2015  . EKG 12-Lead  . EKG 12-Lead  . EKG 12-Lead    ASSESSMENT AND PLAN:   1. Sepsis . Probably from stage IV foul-smelling sacral decubiti with multi resistant organisms Patient is started on Daptomycin and will continue meropenem and discontinues vancomycin, cefepime and Levaquin. Follow-up  blood Culture with no growth so far Wound culture with Escherichia coli, Proteus mirabilis, Klebsiella pneumonia and enterococcus gallinarum off  levophed as blood pressure is better. Hold all antihypertensives medication Appreciate Critical care recommendations.    2. Acute encephalopathy- likely from problem #1/  ? Seizures Initial CT scan was negative.  Patient has no history of seizure. No witnessed seizures Status post EEG  Appreciate neurology recommendations  3. symptomatic anemia-   status post1 unit of blood transfusion and hemoglobin is at 8.5 today. Will repeat CBC  in a.m  4. Thrombocytopenia- no acute bleeding. Platelet count is at 17,000 today.Will hold off on heparin subcutaneous  Transfuse platelets if platelet count is less than or equal to 10,000 HIT/DIC panel and results are pending Oncology recommendations are appreciated Nephrolo agy stopped  using heparin during dialysis, discussed with nephrology they would rather use citrate  instead of heparin  5. End-stage renal disease on hemodialysis- patient is getting hemodialysis today   6. Oxygenate with trach collar at this point 7. Elevated troponin likely demand ischemia with septic shock 8. Stage IV sacral decubitus- status post bedside wound debridement. Appreciate surgery recommendations. Patient needs wound VAC eventually, will follow up with surgery regarding their wound VAC recommendations 9. Hypokalemia and hypomagnesemia- replace potassium and magnesium IV. Check magnesium 10. Nutrition- tube feeds with vital 11. Diarrhea-can be antibiotic induced as well as from tube feeds-flexi seal   All the records are reviewed and case discussed with Care Management/Social Workerr. I have discussed the plan of care with patient's husband  Mr. [name], he is agreeable with the current plan of care   CODE STATUS: Full code  TOTAL CRITICAL CARE TIME TAKING CARE OF THIS PATIENT: 35 minutes.   POSSIBLE D/C to LTAC , DEPENDING ON CLINICAL CONDITION.   Ramonita Lab M.D on 08/17/2015 at 2:30 PM  Between 7am to 6pm - Pager - [phone] After 6pm go to [email or web address] - password [email or web address] [facility]  [facility]  Office  [phone]  CC: Primary care physician; [name], MD

## 2015-08-18 ENCOUNTER — Inpatient Hospital Stay: Payer: 59

## 2015-08-18 DIAGNOSIS — I959 Hypotension, unspecified: Secondary | ICD-10-CM

## 2015-08-18 LAB — COMPREHENSIVE METABOLIC PANEL
ALK PHOS: 384 U/L — AB (ref 38–126)
ALT: 22 U/L (ref 14–54)
ANION GAP: 4 — AB (ref 5–15)
AST: 23 U/L (ref 15–41)
Albumin: 1.7 g/dL — ABNORMAL LOW (ref 3.5–5.0)
BILIRUBIN TOTAL: 1 mg/dL (ref 0.3–1.2)
BUN: 31 mg/dL — ABNORMAL HIGH (ref 6–20)
CALCIUM: 7.6 mg/dL — AB (ref 8.9–10.3)
CO2: 32 mmol/L (ref 22–32)
Chloride: 104 mmol/L (ref 101–111)
Creatinine, Ser: 1.24 mg/dL — ABNORMAL HIGH (ref 0.44–1.00)
GFR, EST AFRICAN AMERICAN: 58 mL/min — AB (ref >60)
GFR, EST NON AFRICAN AMERICAN: 50 mL/min — AB (ref >60)
Glucose, Bld: 136 mg/dL — ABNORMAL HIGH (ref 65–99)
POTASSIUM: 4 mmol/L (ref 3.5–5.1)
Sodium: 140 mmol/L (ref 135–145)
TOTAL PROTEIN: 4.4 g/dL — AB (ref 6.5–8.1)

## 2015-08-18 LAB — RENAL FUNCTION PANEL
ALBUMIN: 1.7 g/dL — AB (ref 3.5–5.0)
Anion gap: 3 — ABNORMAL LOW (ref 5–15)
BUN: 31 mg/dL — AB (ref 6–20)
CALCIUM: 7.3 mg/dL — AB (ref 8.9–10.3)
CHLORIDE: 102 mmol/L (ref 101–111)
CO2: 31 mmol/L (ref 22–32)
CREATININE: 1.3 mg/dL — AB (ref 0.44–1.00)
GFR, EST AFRICAN AMERICAN: 54 mL/min — AB (ref >60)
GFR, EST NON AFRICAN AMERICAN: 47 mL/min — AB (ref >60)
Glucose, Bld: 132 mg/dL — ABNORMAL HIGH (ref 65–99)
PHOSPHORUS: 2 mg/dL — AB (ref 2.5–4.6)
Potassium: 3.8 mmol/L (ref 3.5–5.1)
SODIUM: 136 mmol/L (ref 135–145)

## 2015-08-18 LAB — WOUND CULTURE: CULTURE: NO GROWTH

## 2015-08-18 LAB — CBC
HCT: 25.5 % — ABNORMAL LOW (ref 35.0–47.0)
HEMATOCRIT: 26 % — AB (ref 35.0–47.0)
HEMOGLOBIN: 8.4 g/dL — AB (ref 12.0–16.0)
Hemoglobin: 8.1 g/dL — ABNORMAL LOW (ref 12.0–16.0)
MCH: 29.8 pg (ref 26.0–34.0)
MCH: 29.5 pg (ref 26.0–34.0)
MCHC: 32.1 g/dL (ref 32.0–36.0)
MCHC: 31.8 g/dL — ABNORMAL LOW (ref 32.0–36.0)
MCV: 93 fL (ref 80.0–100.0)
MCV: 93 fL (ref 80.0–100.0)
PLATELETS: 20 10*3/uL — AB (ref 150–440)
Platelets: 20 10*3/uL — CL (ref 150–440)
RBC: 2.8 MIL/uL — ABNORMAL LOW (ref 3.80–5.20)
RBC: 2.74 MIL/uL — AB (ref 3.80–5.20)
RDW: 16.4 % — ABNORMAL HIGH (ref 11.5–14.5)
RDW: 16.5 % — AB (ref 11.5–14.5)
WBC: 7.5 10*3/uL (ref 3.6–11.0)
WBC: 7.2 10*3/uL (ref 3.6–11.0)

## 2015-08-18 LAB — GLUCOSE, CAPILLARY
GLUCOSE-CAPILLARY: 118 mg/dL — AB (ref 65–99)
GLUCOSE-CAPILLARY: 122 mg/dL — AB (ref 65–99)
GLUCOSE-CAPILLARY: 135 mg/dL — AB (ref 65–99)
GLUCOSE-CAPILLARY: 141 mg/dL — AB (ref 65–99)
GLUCOSE-CAPILLARY: 117 mg/dL — AB (ref 65–99)
Glucose-Capillary: 125 mg/dL — ABNORMAL HIGH (ref 65–99)

## 2015-08-18 MED ORDER — ANTICOAGULANT SODIUM CITRATE 4% (200MG/5ML) IV SOLN
5.0000 mL | Status: DC | PRN
  Administered 2015-08-18 – 2015-08-26 (×3): 5 mL via INTRAVENOUS_CENTRAL
  Filled 2015-08-18 (×8): qty 250

## 2015-08-18 MED ORDER — SODIUM CHLORIDE 0.9 % IV SOLN
500.0000 mg | INTRAVENOUS | Status: DC
  Administered 2015-08-18: 500 mg via INTRAVENOUS
  Filled 2015-08-18: qty 10

## 2015-08-18 MED ORDER — DAKINS (1/4 STRENGTH) 0.125 % EX SOLN
Freq: Two times a day (BID) | CUTANEOUS | Status: DC
  Administered 2015-08-18 – 2015-08-25 (×15)
  Administered 2015-08-26: 1
  Administered 2015-08-26 – 2015-09-21 (×52)
  Administered 2015-09-22: 1
  Administered 2015-09-22 – 2015-09-26 (×9)
  Administered 2015-09-27: 1
  Administered 2015-09-27 – 2015-10-04 (×14)
  Administered 2015-10-04 – 2015-10-05 (×2): 1
  Administered 2015-10-05 – 2015-10-09 (×4)
  Filled 2015-08-18 (×26): qty 473

## 2015-08-18 MED ORDER — RISAQUAD PO CAPS
1.0000 | ORAL_CAPSULE | Freq: Every day | ORAL | Status: DC
  Administered 2015-08-18 – 2015-09-18 (×29): 1 via ORAL
  Filled 2015-08-18 (×30): qty 1

## 2015-08-18 MED ORDER — HYDROCORTISONE 5 MG/ML ORAL SUSPENSION: 10.0000 mg | Freq: Two times a day (BID) | ORAL | Status: DC

## 2015-08-18 MED ORDER — MIDODRINE HCL 5 MG PO TABS
10.0000 mg | ORAL_TABLET | Freq: Three times a day (TID) | ORAL | Status: DC
  Administered 2015-08-18 – 2015-09-17 (×75): 10 mg via ORAL
  Filled 2015-08-18 (×78): qty 2

## 2015-08-18 MED ORDER — HYDROCORTISONE 10 MG PO TABS
10.0000 mg | ORAL_TABLET | Freq: Two times a day (BID) | ORAL | Status: DC
  Administered 2015-08-18 – 2015-08-22 (×10): 10 mg via ORAL
  Filled 2015-08-18 (×10): qty 1

## 2015-08-18 NOTE — Progress Notes (Signed)
Post hd tx 

## 2015-08-18 NOTE — Progress Notes (Signed)
Pre-hd tx 

## 2015-08-18 NOTE — Progress Notes (Signed)
ELECTROLYTE MANAGEMENT - INITIAL  Pharmacy Consult for Electrolyte Management Indication: hypokalemia  Allergies  Allergen Reactions  . Contrast Media [Iodinated Diagnostic Agents] Anaphylaxis  . Ampicillin Rash    Patient Measurements: Height:  (175.3 cm) Weight: 181 lb (82.1 kg) IBW/kg (Calculated) : 66.2   Vital Signs: Temp: 98.1 F (36.7 C) (09/29 0400) Temp Source: Axillary (09/29 0400) BP: 121/76 mmHg (09/29 0500) Pulse Rate: 104 (09/29 0500) Intake/Output from previous day: 09/28 0701 - 09/29 0700 In: 1793 [I.V.:133; NG/GT:1260; IV Piggyback:400] Out: 150 [Stool:150] Intake/Output from this shift: Total I/O In: 830 [I.V.:70; NG/GT:710; IV Piggyback:50] Out: 0   Labs:  Recent Labs  08/16/15 0422 08/16/15 1323 08/17/15 0446 08/18/15 0507  WBC 9.4  --  6.7 7.5  HGB 8.5*  --  8.3* 8.4*  HCT 26.3*  --  25.5* 26.0*  PLT 15*  --  17* 20*  APTT  --  82*  --   --   INR  --  1.48  --   --      Recent Labs  08/16/15 0422 08/17/15 0446 08/17/15 1350 08/18/15 0507  NA 142 141 142 140  K 3.8 3.0* 3.8 4.0  CL 105 105 104 104  CO2 31 31 33* 32  GLUCOSE 234* 148* 118* 136*  BUN 25* 18 23* 31*  CREATININE 1.45* 0.94 1.10* 1.24*  CALCIUM 7.6* 7.2* 7.6* 7.6*  MG 1.7 1.7  --   --   PHOS 3.4  --   --   --   PROT  --   --   --  4.4*  ALBUMIN  --   --   --  1.7*  AST  --   --   --  23  ALT  --   --   --  22  ALKPHOS  --   --   --  384*  BILITOT  --   --   --  1.0   Estimated Creatinine Clearance: 62.2 mL/min (by C-G formula based on Cr of 1.24).    Recent Labs  08/17/15 1648 08/17/15 2126 08/17/15 2322  GLUCAP 87 109* 125*    Medical History: Past Medical History  Diagnosis Date  . Obesity   . Dyslipidemia   . Hypertension   . Coronary artery disease     s/p BMS 2010 LAD  . Dysrhythmia     ventricular tachycardia resolved after LAD stent and beta blocker  . Diabetes mellitus     with retinopathy, neuropathy and microalbuminemia  .  ESRD (end stage renal disease) on dialysis     Medications:  Scheduled:  . sodium chloride   Intravenous Once  . sodium chloride   Intravenous Once  . DAPTOmycin (CUBICIN)  IV  6 mg/kg Intravenous Q48H  . epoetin (EPOGEN/PROCRIT) injection  4,000 Units Intravenous Q T,Th,Sa-HD  . feeding supplement (PRO-STAT SUGAR FREE 64)  30 mL Per Tube TID  . free water  100 mL Per Tube 3 times per day  . insulin aspart  0-15 Units Subcutaneous 4 times per day  . meropenem (MERREM) IV  500 mg Intravenous Q24H  . nystatin   Topical TID  . sodium hypochlorite   Irrigation TID    Assessment: 49 yo female admitted for sepsis, found to have hypokalemia. Pharmacy consulted for electrolyte management.  Plan:  K is wnl so no need for further replacement. Ca corrects to  9.5 (albumin 1.6 9/23). Will f/u AM labs.   0929 AM K 4, corrected  calcium 9.4, no replacement needed at this time. Dialysis patient. Will f/u labs on 10/1.   Carola Frost, Pharm.D. Clinical Pharmacist 08/18/2015,6:05 AM

## 2015-08-18 NOTE — Progress Notes (Signed)
Subjective:  Pt still in CCU. Remains critically ill Tube feeds going without problem Rectal tube in place Opens eyes to verbal commands Patient seen during dialysis Tolerating well  BP borderline low  Objective:  Vital signs in last 24 hours:  Temp:  [97.9 F (36.6 C)-98.5 F (36.9 C)] 98 F (36.7 C) (09/29 0830) Pulse Rate:  [85-111] 101 (09/29 1000) Resp:  [8-28] 20 (09/29 1000) BP: (81-153)/(32-86) 90/55 mmHg (09/29 1000) SpO2:  [100 %] 100 % (09/29 1000) FiO2 (%):  [28 %] 28 % (09/29 0400) Weight:  [81.2 kg (179 lb 0.2 oz)] 81.2 kg (179 lb 0.2 oz) (09/29 0830)  Weight change:  Filed Weights   08/16/15 0900 08/16/15 1300 08/18/15 0830  Weight: 83.5 kg (184 lb 1.4 oz) 82.1 kg (181 lb) 81.2 kg (179 lb 0.2 oz)    Intake/Output: I/O last 3 completed shifts: In: 3213 [I.V.:153; NG/GT:2560; IV Piggyback:500] Out: 525 [Stool:525]   Intake/Output this shift:  Total I/O In: 100 [NG/GT:100] Out: -   Physical Exam: General: Chronically ill appearing  Head: Normocephalic, atraumatic. Moist oral mucosal membranes  Eyes: Eyes closed  Neck: Trach in place  Lungs:  Scattered rhonchi, normal effort  Heart: S1S2 no rubs  Abdomen:  Soft, nontender, BS present  Extremities:  + dependent and peripheral edema, b/l feet in soft support  Neurologic: Lethargic, opens eyes to verbal commands  Skin: No lesions  Access: R IJ permcath    Basic Metabolic Panel:  Recent Labs Lab 07/28/2015 0850 08/13/15 0250  08/14/15 1203 08/15/15 0530 08/16/15 0422 08/17/15 0446 08/17/15 1350 08/18/15 0507  NA 141 138  < >  --  141 142 141 142 140  K 2.3* 3.4*  < >  --  4.0 3.8 3.0* 3.8 4.0  CL 103 103  < >  --  104 105 105 104 104  CO2 30 28  < >  --  32 31 31 33* 32  GLUCOSE 87 123*  < >  --  76 234* 148* 118* 136*  BUN 17 19  < >  --  17 25* 18 23* 31*  CREATININE 1.29* 1.41*  < >  --  1.11* 1.45* 0.94 1.10* 1.24*  CALCIUM 7.1* 7.5*  < >  --  7.8* 7.6* 7.2* 7.6* 7.6*  MG 1.4* 1.8  --    --   --  1.7 1.7  --   --   PHOS  --   --   --  1.6* 1.6* 3.4  --   --   --   < > = values in this interval not displayed.  Liver Function Tests:  Recent Labs Lab 08/07/2015 0850 08/18/15 0507  AST 16 23  ALT 13* 22  ALKPHOS 175* 384*  BILITOT 0.9 1.0  PROT 4.4* 4.4*  ALBUMIN 1.6* 1.7*   No results for input(s): LIPASE, AMYLASE in the last 168 hours. No results for input(s): AMMONIA in the last 168 hours.  CBC:  Recent Labs Lab 07/27/2015 0850  08/14/15 1747 08/15/15 0530 08/16/15 0422 08/17/15 0446 08/18/15 0507  WBC 15.7*  < > 14.8* 14.9* 9.4 6.7 7.5  NEUTROABS 13.8*  --  14.0*  --   --  5.4  --   HGB 6.4*  < > 6.8* 9.1* 8.5* 8.3* 8.4*  HCT 20.5*  < > 21.4* 27.8* 26.3* 25.5* 26.0*  MCV 95.5  < > 92.8 91.8 92.5 92.6 93.0  PLT 52*  < > 43* 21* 15* 17* 20*  < > =  values in this interval not displayed.  Cardiac Enzymes:  Recent Labs Lab 09/09/15 0850  TROPONINI 0.22*    BNP: Invalid input(s): POCBNP  CBG:  Recent Labs Lab 08/17/15 1147 08/17/15 1648 08/17/15 2126 08/17/15 2322 08/18/15 0744  GLUCAP 143* 87 109* 125* 118*    Microbiology: Results for orders placed or performed during the hospital encounter of 09-09-15  Blood Culture (routine x 2)     Status: None   Collection Time: Sep 09, 2015  8:51 AM  Result Value Ref Range Status   Specimen Description BLOOD [name]  Final   Special Requests BOTTLES DRAWN AEROBIC AND ANAEROBIC  3CC  Final   Culture NO GROWTH 5 DAYS  Final   Report Status 08/17/2015 FINAL  Final  Blood Culture (routine x 2)     Status: None   Collection Time: 09-09-15  9:20 AM  Result Value Ref Range Status   Specimen Description BLOOD LEFT ARM  Final   Special Requests BOTTLES DRAWN AEROBIC AND ANAEROBIC  1CC  Final   Culture NO GROWTH 5 DAYS  Final   Report Status 08/17/2015 FINAL  Final  Wound culture     Status: None   Collection Time: 2015-09-09  9:20 AM  Result Value Ref Range Status   Specimen Description DECUBITIS  Final    Special Requests Normal  Final   Gram Stain   Final    FEW WBC SEEN MANY GRAM NEGATIVE RODS RARE GRAM POSITIVE COCCI    Culture   Final    HEAVY GROWTH ESCHERICHIA COLI MODERATE GROWTH ENTEROBACTER AEROGENES PROTEUS MIRABILIS HEAVY GROWTH ENTEROCOCCUS SPECIES VRE HAVE INTRINSIC RESISTANCE TO MOST COMMONLY USED ANTIBIOTICS AND THE ABILITY TO ACQUIRE RESISTANCE TO MOST AVAILABLE ANTIBIOTICS.    Report Status 08/16/2015 FINAL  Final   Organism ID, Bacteria ESCHERICHIA COLI  Final   Organism ID, Bacteria ENTEROBACTER AEROGENES  Final   Organism ID, Bacteria PROTEUS MIRABILIS  Final   Organism ID, Bacteria ENTEROCOCCUS SPECIES  Final      Susceptibility   Enterobacter aerogenes - MIC*    CEFTAZIDIME <=1 SENSITIVE Sensitive     CEFAZOLIN >=64 RESISTANT Resistant     CEFTRIAXONE <=1 SENSITIVE Sensitive     CIPROFLOXACIN <=0.25 SENSITIVE Sensitive     GENTAMICIN <=1 SENSITIVE Sensitive     IMIPENEM 1 SENSITIVE Sensitive     TRIMETH/SULFA <=20 SENSITIVE Sensitive     * MODERATE GROWTH ENTEROBACTER AEROGENES   Escherichia coli - MIC*    AMPICILLIN <=2 SENSITIVE Sensitive     CEFTAZIDIME <=1 SENSITIVE Sensitive     CEFAZOLIN <=4 SENSITIVE Sensitive     CEFTRIAXONE <=1 SENSITIVE Sensitive     CIPROFLOXACIN <=0.25 SENSITIVE Sensitive     GENTAMICIN <=1 SENSITIVE Sensitive     IMIPENEM <=0.25 SENSITIVE Sensitive     TRIMETH/SULFA <=20 SENSITIVE Sensitive     * HEAVY GROWTH ESCHERICHIA COLI   Proteus mirabilis - MIC*    AMPICILLIN >=32 RESISTANT Resistant     CEFTAZIDIME <=1 SENSITIVE Sensitive     CEFAZOLIN 8 SENSITIVE Sensitive     CEFTRIAXONE <=1 SENSITIVE Sensitive     CIPROFLOXACIN <=0.25 SENSITIVE Sensitive     GENTAMICIN <=1 SENSITIVE Sensitive     IMIPENEM 1 SENSITIVE Sensitive     TRIMETH/SULFA <=20 SENSITIVE Sensitive     * PROTEUS MIRABILIS   Enterococcus species - MIC*    AMPICILLIN >=32 RESISTANT Resistant     VANCOMYCIN >=32 RESISTANT Resistant     GENTAMICIN SYNERGY  SENSITIVE Sensitive  TETRACYCLINE Value in next row Resistant      RESISTANT>=16    * HEAVY GROWTH ENTEROCOCCUS SPECIES  MRSA PCR Screening     Status: None   Collection Time: 08/11/2015  2:38 PM  Result Value Ref Range Status   MRSA by PCR NEGATIVE NEGATIVE Final    Comment:        The GeneXpert MRSA Assay (FDA approved for NASAL specimens only), is one component of a comprehensive MRSA colonization surveillance program. It is not intended to diagnose MRSA infection nor to guide or monitor treatment for MRSA infections.   Blood culture (single)     Status: None   Collection Time: 08/11/2015  3:36 PM  Result Value Ref Range Status   Specimen Description BLOOD RIGHT ASSIST CONTROL  Final   Special Requests BOTTLES DRAWN AEROBIC AND ANAEROBIC  Final   Culture NO GROWTH 5 DAYS  Final   Report Status 08/17/2015 FINAL  Final  Wound culture     Status: None   Collection Time: 08/13/15 12:37 PM  Result Value Ref Range Status   Specimen Description WOUND  Final   Special Requests Normal  Final   Gram Stain   Final    FEW WBC SEEN TOO NUMEROUS TO COUNT GRAM NEGATIVE RODS FEW GRAM POSITIVE COCCI    Culture   Final    HEAVY GROWTH ESCHERICHIA COLI MODERATE GROWTH PROTEUS MIRABILIS LIGHT GROWTH KLEBSIELLA PNEUMONIAE MODERATE GROWTH ENTEROCOCCUS GALLINARUM CRITICAL RESULT CALLED TO, READ BACK BY AND VERIFIED WITH: [facility] BORBA AT 1042 08/16/15 DV    Report Status 08/17/2015 FINAL  Final   Organism ID, Bacteria ESCHERICHIA COLI  Final   Organism ID, Bacteria PROTEUS MIRABILIS  Final   Organism ID, Bacteria KLEBSIELLA PNEUMONIAE  Final   Organism ID, Bacteria ENTEROCOCCUS GALLINARUM  Final      Susceptibility   Escherichia coli - MIC*    AMPICILLIN >=32 RESISTANT Resistant     CEFTAZIDIME 4 RESISTANT Resistant     CEFAZOLIN >=64 RESISTANT Resistant     CEFTRIAXONE 16 RESISTANT Resistant     GENTAMICIN 2 SENSITIVE Sensitive     IMIPENEM >=16 RESISTANT Resistant      TRIMETH/SULFA <=20 SENSITIVE Sensitive     Extended ESBL POSITIVE Resistant     PIP/TAZO Value in next row Resistant      RESISTANT>=128    CIPROFLOXACIN Value in next row Sensitive      SENSITIVE<=0.25    * HEAVY GROWTH ESCHERICHIA COLI   Klebsiella pneumoniae - MIC*    AMPICILLIN Value in next row Resistant      SENSITIVE<=0.25    CEFTAZIDIME Value in next row Resistant      SENSITIVE<=0.25    CEFAZOLIN Value in next row Resistant      SENSITIVE<=0.25    CEFTRIAXONE Value in next row Resistant      SENSITIVE<=0.25    CIPROFLOXACIN Value in next row Resistant      SENSITIVE<=0.25    GENTAMICIN Value in next row Sensitive      SENSITIVE<=0.25    IMIPENEM Value in next row Resistant      SENSITIVE<=0.25    TRIMETH/SULFA Value in next row Resistant      SENSITIVE<=0.25    PIP/TAZO Value in next row Resistant      RESISTANT>=128    * LIGHT GROWTH KLEBSIELLA PNEUMONIAE   Proteus mirabilis - MIC*    AMPICILLIN Value in next row Resistant      RESISTANT>=128    CEFTAZIDIME  Value in next row Sensitive      RESISTANT>=128    CEFAZOLIN Value in next row Sensitive      RESISTANT>=128    CEFTRIAXONE Value in next row Sensitive      RESISTANT>=128    CIPROFLOXACIN Value in next row Sensitive      RESISTANT>=128    GENTAMICIN Value in next row Sensitive      RESISTANT>=128    IMIPENEM Value in next row Sensitive      RESISTANT>=128    TRIMETH/SULFA Value in next row Sensitive      RESISTANT>=128    PIP/TAZO Value in next row Sensitive      SENSITIVE<=4    * MODERATE GROWTH PROTEUS MIRABILIS   Enterococcus gallinarum - MIC*    AMPICILLIN Value in next row Resistant      SENSITIVE<=4    GENTAMICIN SYNERGY Value in next row Sensitive      SENSITIVE<=4    CIPROFLOXACIN Value in next row Resistant      RESISTANT>=8    TETRACYCLINE Value in next row Resistant      RESISTANT>=16    * MODERATE GROWTH ENTEROCOCCUS GALLINARUM  Wound culture     Status: None (Preliminary result)    Collection Time: 08/15/15  2:53 PM  Result Value Ref Range Status   Specimen Description WOUND  Final   Special Requests NONE  Final   Gram Stain FEW WBC SEEN NO ORGANISMS SEEN   Final   Culture NO GROWTH 2 DAYS  Final   Report Status PENDING  Incomplete  C difficile quick scan w PCR reflex     Status: None   Collection Time: 08/17/15 11:34 AM  Result Value Ref Range Status   C Diff antigen NEGATIVE NEGATIVE Final   C Diff toxin NEGATIVE NEGATIVE Final   C Diff interpretation Negative for C. difficile  Final    Coagulation Studies:  Recent Labs  08/16/15 1323  LABPROT 18.1*  INR 1.48    Urinalysis: No results for input(s): COLORURINE, LABSPEC, PHURINE, GLUCOSEU, HGBUR, BILIRUBINUR, KETONESUR, PROTEINUR, UROBILINOGEN, NITRITE, LEUKOCYTESUR in the last 72 hours.  Invalid input(s): APPERANCEUR    Imaging: No results found.   Medications:   . feeding supplement (VITAL 1.5 CAL) 1,000 mL (08/17/15 0700)   . sodium chloride   Intravenous Once  . sodium chloride   Intravenous Once  . DAPTOmycin (CUBICIN)  IV  6 mg/kg Intravenous Q48H  . epoetin (EPOGEN/PROCRIT) injection  4,000 Units Intravenous Q T,Th,Sa-HD  . feeding supplement (PRO-STAT SUGAR FREE 64)  30 mL Per Tube TID  . free water  100 mL Per Tube 3 times per day  . hydrocortisone  10 mg Oral BID  . insulin aspart  0-15 Units Subcutaneous 4 times per day  . meropenem (MERREM) IV  500 mg Intravenous Q24H  . midodrine  10 mg Oral TID WC  . nystatin   Topical TID  . sodium hypochlorite   Irrigation TID   sodium chloride, sodium chloride, alteplase, anticoagulant sodium citrate, lidocaine (PF), lidocaine-prilocaine, pentafluoroprop-tetrafluoroeth  Assessment/ Plan:  49 y.o. 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  1. End-stage renal disease on hemodialysis on HD TTHS. The patient has been on dialysis since October of 2014.  R IJ permcath.  - Patient seen during dialysis Tolerating well  - Add midodrine - Lower dialysate temp - No UF  2. Anemia of CKD: received blood transfusion this admission. - continue EPO  3. Secondary hyperparathyroidism: phos low at 1.6. Replaced per icu protocol  4. Septic shock: Multiple potential sources, has two catheters in place, also has a sacral decubitis, and also has trach in place.  - blood cultures thus far negative.    - multiple organisms growing via wound culture - appreciate ID recommendations  5. peripheral edema - UF with HD with iv albumin support as tolerated   6.  Thrombocytopenia:  Platelets declining, defer further work up to [employer] and hospitalist.  pack catheter with citrate  patient's husband - John   LOS: 6 SINGH,HARMEET 9/29/201610:05 AM

## 2015-08-18 NOTE — Progress Notes (Signed)
Initial Nutrition Assessment    INTERVENTION:   EN: recommend continuing current TF regimen Nutrition related medication management: pt with diarrhea, Cdiff negative, on antibiotic therapy; recommend addition of probiotic   NUTRITION DIAGNOSIS:   Inadequate oral intake related to wound healing, acute illness, chronic illness as evidenced by NPO status, estimated needs. Being addressed via TF  GOAL:   Patient will meet greater than or equal to 90% of their needs  MONITOR:    (Energy Intake, Anthropometrics, Digestive System, Electrolyte/Renal Profile, Glucose Profile)  REASON FOR ASSESSMENT:   Consult Enteral/tube feeding initiation and management  ASSESSMENT:    Pt remains encephalopathic, on trach collar, receiving dialysis  EN: tolerating Vital High Protein at rate of 50 ml/hr with Prostat     Skin:   (stage IV ulcer on foot, unstageable on sacrum)  Last BM:  Liquid stool via flexiseal, cdiff negative  Digestive System: no signs of TF intolerance  Electrolyte and Renal Profile:  Recent Labs Lab 08/13/15 0250  08/15/15 0530 08/16/15 0422 08/17/15 0446 08/17/15 1350 08/18/15 0507 08/18/15 0939  BUN 19  < > 17 25* 18 23* 31* 31*  CREATININE 1.41*  < > 1.11* 1.45* 0.94 1.10* 1.24* 1.30*  NA 138  < > 141 142 141 142 140 136  K 3.4*  < > 4.0 3.8 3.0* 3.8 4.0 3.8  MG 1.8  --   --  1.7 1.7  --   --   --   PHOS  --   < > 1.6* 3.4  --   --   --  2.0*  < > = values in this interval not displayed. Glucose Profile:  Recent Labs  08/17/15 2322 08/18/15 0744 08/18/15 1247  GLUCAP 125* 118* 122*   Protein Profile:  Recent Labs Lab 07/28/2015 0850 08/18/15 0507 08/18/15 0939  ALBUMIN 1.6* 1.7* 1.7*   Meds: daptomycin, meropenem, ss novolog, midodrine  Height:   Ht Readings from Last 1 Encounters:  07/21/2015  (1.753 m)    Weight:   Wt Readings from Last 1 Encounters:  08/18/15 179 lb 0.2 oz (81.2 kg)    BMI:  Body mass index is 26.42  kg/(m^2).  Estimated Nutritional Needs:   Kcal:  1610-9604 kcals (BEE 1434, 1.2 AF, 1.2-1.4 IF)   Protein:  113-150 g (1.5-2.0 g/kg)   Fluid:  1000 mL plus UOP   HIGH Care Level  Romelle Starcher MS, RD, LDN [phone] Pager

## 2015-08-18 NOTE — Progress Notes (Signed)
PULMONARY / CRITICAL CARE MEDICINE   Name: Courtney [name] MRN: [id number] DOB: May 11, 1966    ADMISSION DATE:  07/21/2015 CONSULTATION DATE: 9/23  INITIAL PRESENTATION:  58 F who has been in medical facilities (hosp, LTAC, rehab) for 2 yrs following gastric bypass surgery with multiple complications. Now with chronic trach, ESRD, profound debilitation, severe sacral pressure ulcer. Was seemingly making progress and transferred to rehab facility approx one week prior to this admission. She was sent to [facility] ED with AMS and hypotension. Working dx of severe sepsis/septic shock due to infected sacral pressure ulcer. Also has R side externalized HD cath and tunneled L IJ CVL. She has history of resistant bacterial infections. She is on chronic steroids. Her husband indicates that she has been diagnosed with adrenal insuff at some point during the past 2 yrs  MAJOR EVENTS/TEST RESULTS: 9/23 CT head: NAD 9/23 EEG: no epileptiform activity 9/23 PRBCs for Hgb 6.4 9/24 bedside debridement of sacral wound. Abscess drained 9/25 Off vasopressors. More alert. No distress. Worsening thrombocytopenia. Vanc DC'd  INDWELLING DEVICES:: Trach (chronic)  Tunneled R IJ HD cath (chronic) Tunneled L IJ CVL (chronic) L femoral A-line 9/23 >> 9/25  MICRO DATA: MRSA PCR 9/23 >> NEG Urine 9/23 >>  Wound (swab) 9/23 >> many GNR, rare GPC >>  Blood 9/23 >>  Wound (debridement) 9/24 >> heavy GNR >> CRE, Enterococcus, K. Pneumoniae, P. Mirabilis, VRE CDiff 9/27>>   ANTIMICROBIALS:  Aztreonam 9/23 >> 9/24 Vanc 9/23 >> 9/25 Vanc 9/26>>9/27 Meropenem 9/23 >>  Daptomycin 9/27>>   SUBJECTIVE:  RASS, -1.  Nothing purposeful at this time, off pressors, BP mildly low this morning (sbp in upper 80s) On Trach collar at 28%, mild thin secretions.  Open eyes, but not tracking Plt count mildly increasing Updated husband on status - left VM today  VITAL SIGNS: Temp:  [97.9 F (36.6 C)-98.5 F (36.9 C)]  98 F (36.7 C) (09/29 0830) Pulse Rate:  [85-111] 103 (09/29 0930) Resp:  [8-28] 26 (09/29 0930) BP: (81-153)/(32-86) 81/32 mmHg (09/29 0930) SpO2:  [100 %] 100 % (09/29 0930) FiO2 (%):  [28 %] 28 % (09/29 0400) Weight:  [179 lb 0.2 oz (81.2 kg)] 179 lb 0.2 oz (81.2 kg) (09/29 0830) HEMODYNAMICS:   VENTILATOR SETTINGS: Vent Mode:  [-]  FiO2 (%):  [28 %] 28 % INTAKE / OUTPUT:  Intake/Output Summary (Last 24 hours) at 08/18/15 0935 Last data filed at 08/18/15 0600  Gross per 24 hour  Intake   1693 ml  Output    150 ml  Net   1543 ml    PHYSICAL EXAMINATION: General: RASS 0, -1, + F/C, no respiratory distress Neuro: CNs intact, MAEs, DTRs symmetric HEENT: Cushingoid facies Neck: no JVD noted, trach site clean Cardiovascular: Regular, no M noted Lungs: No adventitious sounds Abdomen: Obese, soft, NT, diminished BS Ext: cool, diminished distal pulses, no edema Skin: very deep, malodorous sacral pressure ulcer  LABS:  CBC  Recent Labs Lab 08/16/15 0422 08/17/15 0446 08/18/15 0507  WBC 9.4 6.7 7.5  HGB 8.5* 8.3* 8.4*  HCT 26.3* 25.5* 26.0*  PLT 15* 17* 20*   Coag's  Recent Labs Lab 08/16/15 1323  APTT 82*  INR 1.48   BMET  Recent Labs Lab 08/17/15 0446 08/17/15 1350 08/18/15 0507  NA 141 142 140  K 3.0* 3.8 4.0  CL 105 104 104  CO2 31 33* 32  BUN 18 23* 31*  CREATININE 0.94 1.10* 1.24*  GLUCOSE 148* 118* 136*  Electrolytes  Recent Labs Lab 08/13/15 0250  08/14/15 1203 08/15/15 0530 08/16/15 0422 08/17/15 0446 08/17/15 1350 08/18/15 0507  CALCIUM 7.5*  < >  --  7.8* 7.6* 7.2* 7.6* 7.6*  MG 1.8  --   --   --  1.7 1.7  --   --   PHOS  --   --  1.6* 1.6* 3.4  --   --   --   < > = values in this interval not displayed. Sepsis Markers  Recent Labs Lab Sep 03, 2015 0853 Sep 03, 2015 1605  LATICACIDVEN 1.2 1.5   ABG  Recent Labs Lab 08/15/15 0850 08/16/15 0521 08/17/15 1154  PHART 7.47* 7.46* 7.48*  PCO2ART 44 44 45  PO2ART 65* 92 111*    Liver Enzymes  Recent Labs Lab 09-03-15 0850 08/18/15 0507  AST 16 23  ALT 13* 22  ALKPHOS 175* 384*  BILITOT 0.9 1.0  ALBUMIN 1.6* 1.7*   Cardiac Enzymes  Recent Labs Lab 09-03-2015 0850  TROPONINI 0.22*   Glucose  Recent Labs Lab 08/17/15 0522 08/17/15 1147 08/17/15 1648 08/17/15 2126 08/17/15 2322 08/18/15 0744  GLUCAP 148* 143* 87 109* 125* 118*    CXR: NNF    ASSESSMENT / PLAN:  PULMONARY A: 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 HD 9/27  GASTROINTESTINAL A:  Loose stool - back on TF  Hx of C. diff P:   SUP: N/I unless requires vent support Begin D III diet with supervision 9/25 Check c.diff PCR  HEMATOLOGIC A:   Acute on chronic anemia - unclear if acute blood loss Thrombocytopenia, worsening. Etiology unclear. Was on heparin PTA P:  DVT px: SCDs Monitor CBC intermittently Transfuse per usual guidelines Check HIT panel 9/25 - pending Might need platelet transfusion prior to next debridement  INFECTIOUS A:   Severe sepsis Sacral decubitus ulcer with abscess P:   Monitor temp, WBC count Micro and abx as above Surgery following ID recs appreciated - multiple MDR organisms, but improving slowly with current antibiotic selection, we require extensive wound care.   ENDOCRINE A:  DM 2, controlled Chronic prednisone therapy - chronic dose 10 mg BID Secondary adrenal insuff P:   CBGs and SSI - mod scale Stress dose steroids - wean  as able based on BP  NEUROLOGIC A:   Acute encephalopathy, resolving-somnolent still P:   RASS goal: 0 Minimize sedating meds - baseline mentation is talking and following commands.   FAMILY:  - updating  husband daily on current status ([phone]) - husband requested that when patient is more stable that she be transferred back to [facility]  I have personally obtained a history, examined the patient, evaluated laboratory and imaging results, formulated the assessment and plan and placed orders. CRITICAL CARE: The patient is critically ill with multiple organ systems failure and requires high complexity decision making for assessment and support, frequent evaluation and titration of therapies, application of advanced monitoring technologies and extensive interpretation of multiple databases. Critical Care Time devoted to patient care services described  in this note is 35 minutes.    Stephanie Acre, MD  Pulmonary and Critical Care Pager [phone] (please enter 7-digits) On Call Pager - [phone] (please enter 7-digits)      08/18/2015, 9:35 AM

## 2015-08-18 NOTE — Progress Notes (Signed)
HD tx start 

## 2015-08-18 NOTE — Progress Notes (Signed)
[facility] - [place] at [facility]   PATIENT NAME: Courtney [name]    MR#:  [id number]  DATE OF BIRTH:  March 12, 1966  SUBJECTIVE:  CHIEF COMPLAINT:  Patient is encephalopathic , opens eyes to verbal commands but not communicating.  pt on tube feeds - Vital , flexiseal , getting HD at bedside today  REVIEW OF SYSTEMS:  Unable to obtain review of systems DRUG ALLERGIES:   Allergies  Allergen Reactions  . Contrast Media [Iodinated Diagnostic Agents] Anaphylaxis  . Ampicillin Rash    VITALS:  Blood pressure 114/41, pulse 98, temperature 98 F (36.7 C), temperature source Axillary, resp. rate 8, height  (1.753 m), weight 81.7 kg (180 lb 1.9 oz), SpO2 98 %.  PHYSICAL EXAMINATION:  GENERAL:  49 y.o.-year-old patient lying in the bed with no acute distress.  EYES: Pupils equal, round, reactive to light and accommodation No scleral icterus. HEENT:  Normocephalic. Oropharynx and nasopharynx clear. Trach site is intact NECK:  Supple, no jugular venous distention. No thyroid enlargement, no tenderness.  LUNGS: Moderate air entry, decreased breath sounds at the bases, no wheezing, rales,rhonchi or crepitation. No use of accessory muscles of respiration.  CARDIOVASCULAR: S1, S2 normal. No murmurs, rubs, or gallops.  ABDOMEN: Soft, nondistended. Bowel sounds present.  EXTREMITIES: peripheral  edema, no cyanosis, or clubbing.  NEUROLOGIC: Patient is with altered mental status  PSYCHIATRIC: The patient is disoriented SKIN: Large sacral decubitus ulcer . Petechiae on extremities   LABORATORY PANEL:   CBC  Recent Labs Lab 08/18/15 0939  WBC 7.2  HGB 8.1*  HCT 25.5*  PLT 20*   ------------------------------------------------------------------------------------------------------------------  Chemistries   Recent Labs Lab 08/17/15 0446  08/18/15 0507 08/18/15 0939  NA 141  < > 140 136  K 3.0*  < > 4.0 3.8  CL 105  < > 104 102  CO2 31  < > 32  31  GLUCOSE 148*  < > 136* 132*  BUN 18  < > 31* 31*  CREATININE 0.94  < > 1.24* 1.30*  CALCIUM 7.2*  < > 7.6* 7.3*  MG 1.7  --   --   --   AST  --   --  23  --   ALT  --   --  22  --   ALKPHOS  --   --  384*  --   BILITOT  --   --  1.0  --   < > = values in this interval not displayed. ------------------------------------------------------------------------------------------------------------------  Cardiac Enzymes  Recent Labs Lab 07/29/2015 0850  TROPONINI 0.22*   ------------------------------------------------------------------------------------------------------------------  RADIOLOGY:  No results found.  EKG:   Orders placed or performed in visit on 07/28/2015  . EKG 12-Lead  . EKG 12-Lead  . EKG 12-Lead    ASSESSMENT AND PLAN:   1. Sepsis . Probably from stage IV foul-smelling sacral decubiti with multiple drug resistant organisms Patient is started on Daptomycin and  continue meropenem and discontinued vancomycin, cefepime and Levaquin. Follow-up  blood Culture with no growth so far Wound culture with Escherichia coli, Proteus mirabilis, Klebsiella pneumonia and enterococcus gallinarum off  levophed as blood pressure is better. Hold all antihypertensives medication Appreciate Critical care/ID recommendations.    2. Acute encephalopathy- likely from problem #1 Initial CT scan was negative.  Patient has no history of seizure. No witnessed seizures Status post EEG  Appreciate neurology recommendations  3. symptomatic anemia-   status post1 unit of blood transfusion and hemoglobin is at 8.1  today. Will repeat CBC in a.m  4. Thrombocytopenia- no acute bleeding. Platelet count is at 20,000 today.Will hold off on heparin subcutaneous  Transfuse platelets if platelet count is less than or equal to 10,000 HIT/DIC panel and results are pending Oncology recommendations are appreciated Nephrolo agy stopped  using heparin during dialysis, discussed with nephrology they  would rather use citrate instead of heparin  5. End-stage renal disease on hemodialysis- patient is getting hemodialysis today   6. Oxygenate with trach collar at this point 7. Elevated troponin likely demand ischemia with septic shock 8. Stage IV sacral decubitus- status post bedside wound debridement. Appreciate surgery recommendations. Dr Excell Seltzer is planning to do debridement again and planning to place wound VAC over weekend, will continue wound care  9. Hypokalemia and hypomagnesemia- replace potassium and magnesium IV. Check magnesium 10. Nutrition- tube feeds with vital 11. Diarrhea-can be antibiotic induced as well as from tube feeds-flexi seal. Start probiotic   All the records are reviewed and case discussed with Care Management/Social Workerr. I have discussed the plan of care with patient's husband  Mr. [name], he would like to talk to nephrology regarding the current plan of care .   CODE STATUS: Full code  TOTAL CRITICAL CARE TIME TAKING CARE OF THIS PATIENT: 35 minutes.   POSSIBLE D/C to LT[facility] , DEPENDING ON CLINICAL CONDITION.   Ramonita Lab M.D on 08/18/2015 at 5:51 PM  Between 7am to 6pm - Pager - [phone] After 6pm go to [email or web address] - password [email or web address] [facility]  [facility]  Office  [phone]  CC: Primary care physician; [name], MD

## 2015-08-18 NOTE — Progress Notes (Signed)
  ID E note Called by RN because husband wanted an update.  I discussed case with him over phone  and advised him she is on some of the most broad spectrum antibiotics available.  I gave him my opinion that her sacral decubitus ulcer was UNTREATABLE at this point given her other comorbidities including severe protein malnutrition, profound thrombocytopenia, obesity, and bedbound status.  He was upset because I had not contacted [facility] about her prior treatment and cultures. I advised him that this information was not in my opinion clinically relevant at this time given the factors listed above.  He insisted that I was saying his wife was not worth my time. I reiterated that if it was clinically relevant I would spend as much time as necessary.  The interaction did not go well and he was not receptive.   At this point I feel I have to ethically excuse myself from her care, as I believe it is futile.  Since I am the only ID physician on staff I will of course make myself available for questions regarding interpretation of cultures and antibiotic selection via the phone or another physician may be consulted via phone.  However I will not follow the patient any longer.

## 2015-08-18 NOTE — Progress Notes (Signed)
Pharmacy Consult for Daptomycin/Meropenem Indication: Sepsis/Sacral decubitus  Allergies  Allergen Reactions  . Contrast Media [Iodinated Diagnostic Agents] Anaphylaxis  . Ampicillin Rash    Patient Measurements: Height: 5\' 9"  (175.3 cm) Weight: 179 lb 0.2 oz (81.2 kg) IBW/kg (Calculated) : 66.2   Vital Signs: Temp: 98 F (36.7 C) (09/29 0830) Temp Source: Axillary (09/29 0830) BP: 95/46 mmHg (09/29 1215) Pulse Rate: 110 (09/29 1215) Intake/Output from previous day: 09/28 0701 - 09/29 0700 In: 1903 [I.V.:143; NG/GT:1360; IV Piggyback:400] Out: 150 [Stool:150] Intake/Output from this shift: Total I/O In: 200 [NG/GT:200] Out: -   Labs:  Recent Labs  08/17/15 0446 08/17/15 1350 08/18/15 0507 08/18/15 0939  WBC 6.7  --  7.5 7.2  HGB 8.3*  --  8.4* 8.1*  PLT 17*  --  20* 20*  CREATININE 0.94 1.10* 1.24* 1.30*   Estimated Creatinine Clearance: 59 mL/min (by C-G formula based on Cr of 1.3). No results for input(s): VANCOTROUGH, VANCOPEAK, VANCORANDOM, GENTTROUGH, GENTPEAK, GENTRANDOM, TOBRATROUGH, TOBRAPEAK, TOBRARND, AMIKACINPEAK, AMIKACINTROU, AMIKACIN in the last 72 hours.   Microbiology: Recent Results (from the past 720 hour(s))  Culture, blood (routine x 2)     Status: None   Collection Time: 08/01/15  7:00 PM  Result Value Ref Range Status   Specimen Description BLOOD RIGHT ANTECUBITAL  Final   Special Requests BOTTLES DRAWN AEROBIC ONLY 5CC  Final   Culture NO GROWTH 5 DAYS  Final   Report Status 08/06/2015 FINAL  Final  Culture, blood (routine x 2)     Status: None   Collection Time: 08/01/15  7:10 PM  Result Value Ref Range Status   Specimen Description BLOOD RIGHT HAND  Final   Special Requests IN PEDIATRIC BOTTLE 3CC  Final   Culture  Setup Time   Final    GRAM POSITIVE COCCI IN CLUSTERS AEROBIC BOTTLE ONLY CRITICAL RESULT CALLED TO, READ BACK BY AND VERIFIED WITH: D TAYLOR,RN AT 1115 08/02/15 BY L BENFIELD    Culture STAPHYLOCOCCUS SPECIES  (COAGULASE NEGATIVE)  Final   Report Status 08/06/2015 FINAL  Final   Organism ID, Bacteria STAPHYLOCOCCUS SPECIES (COAGULASE NEGATIVE)  Final      Susceptibility   Staphylococcus species (coagulase negative) - MIC*    CIPROFLOXACIN >=8 RESISTANT Resistant     ERYTHROMYCIN <=0.25 SENSITIVE Sensitive     GENTAMICIN 8 INTERMEDIATE Intermediate     OXACILLIN >=4 RESISTANT Resistant     TETRACYCLINE <=1 SENSITIVE Sensitive     VANCOMYCIN 1 SENSITIVE Sensitive     TRIMETH/SULFA 160 RESISTANT Resistant     CLINDAMYCIN <=0.25 SENSITIVE Sensitive     RIFAMPIN <=0.5 SENSITIVE Sensitive     Inducible Clindamycin NEGATIVE Sensitive     * STAPHYLOCOCCUS SPECIES (COAGULASE NEGATIVE)  Culture, blood (routine x 2)     Status: None   Collection Time: 08/02/15  7:00 PM  Result Value Ref Range Status   Specimen Description BLOOD RIGHT ANTECUBITAL  Final   Special Requests BOTTLES DRAWN AEROBIC ONLY 10CC  Final   Culture  Setup Time   Final    GRAM POSITIVE COCCI IN CLUSTERS AEROBIC BOTTLE ONLY CRITICAL RESULT CALLED TO, READ BACK BY AND VERIFIED WITH: DR Gwenevere Abbot RN 1549 08/03/15 A BROWNING    Culture   Final    STAPHYLOCOCCUS SPECIES (COAGULASE NEGATIVE) SUSCEPTIBILITIES PERFORMED ON PREVIOUS CULTURE WITHIN THE LAST 5 DAYS.    Report Status 08/06/2015 FINAL  Final  Culture, blood (routine x 2)     Status: None   Collection  Time: 08/02/15  7:05 PM  Result Value Ref Range Status   Specimen Description BLOOD RIGHT HAND  Final   Special Requests BOTTLES DRAWN AEROBIC AND ANAEROBIC 10CC  Final   Culture NO GROWTH 5 DAYS  Final   Report Status 08/07/2015 FINAL  Final  Blood Culture (routine x 2)     Status: None   Collection Time: 09/05/2015  8:51 AM  Result Value Ref Range Status   Specimen Description BLOOD UNKO  Final   Special Requests BOTTLES DRAWN AEROBIC AND ANAEROBIC  3CC  Final   Culture NO GROWTH 5 DAYS  Final   Report Status 08/17/2015 FINAL  Final  Blood Culture (routine x 2)     Status:  None   Collection Time: Sep 05, 2015  9:20 AM  Result Value Ref Range Status   Specimen Description BLOOD LEFT ARM  Final   Special Requests BOTTLES DRAWN AEROBIC AND ANAEROBIC  1CC  Final   Culture NO GROWTH 5 DAYS  Final   Report Status 08/17/2015 FINAL  Final  Wound culture     Status: None   Collection Time: 09-05-15  9:20 AM  Result Value Ref Range Status   Specimen Description DECUBITIS  Final   Special Requests Normal  Final   Gram Stain   Final    FEW WBC SEEN MANY GRAM NEGATIVE RODS RARE GRAM POSITIVE COCCI    Culture   Final    HEAVY GROWTH ESCHERICHIA COLI MODERATE GROWTH ENTEROBACTER AEROGENES PROTEUS MIRABILIS HEAVY GROWTH ENTEROCOCCUS SPECIES VRE HAVE INTRINSIC RESISTANCE TO MOST COMMONLY USED ANTIBIOTICS AND THE ABILITY TO ACQUIRE RESISTANCE TO MOST AVAILABLE ANTIBIOTICS.    Report Status 08/16/2015 FINAL  Final   Organism ID, Bacteria ESCHERICHIA COLI  Final   Organism ID, Bacteria ENTEROBACTER AEROGENES  Final   Organism ID, Bacteria PROTEUS MIRABILIS  Final   Organism ID, Bacteria ENTEROCOCCUS SPECIES  Final      Susceptibility   Enterobacter aerogenes - MIC*    CEFTAZIDIME <=1 SENSITIVE Sensitive     CEFAZOLIN >=64 RESISTANT Resistant     CEFTRIAXONE <=1 SENSITIVE Sensitive     CIPROFLOXACIN <=0.25 SENSITIVE Sensitive     GENTAMICIN <=1 SENSITIVE Sensitive     IMIPENEM 1 SENSITIVE Sensitive     TRIMETH/SULFA <=20 SENSITIVE Sensitive     * MODERATE GROWTH ENTEROBACTER AEROGENES   Escherichia coli - MIC*    AMPICILLIN <=2 SENSITIVE Sensitive     CEFTAZIDIME <=1 SENSITIVE Sensitive     CEFAZOLIN <=4 SENSITIVE Sensitive     CEFTRIAXONE <=1 SENSITIVE Sensitive     CIPROFLOXACIN <=0.25 SENSITIVE Sensitive     GENTAMICIN <=1 SENSITIVE Sensitive     IMIPENEM <=0.25 SENSITIVE Sensitive     TRIMETH/SULFA <=20 SENSITIVE Sensitive     * HEAVY GROWTH ESCHERICHIA COLI   Proteus mirabilis - MIC*    AMPICILLIN >=32 RESISTANT Resistant     CEFTAZIDIME <=1 SENSITIVE  Sensitive     CEFAZOLIN 8 SENSITIVE Sensitive     CEFTRIAXONE <=1 SENSITIVE Sensitive     CIPROFLOXACIN <=0.25 SENSITIVE Sensitive     GENTAMICIN <=1 SENSITIVE Sensitive     IMIPENEM 1 SENSITIVE Sensitive     TRIMETH/SULFA <=20 SENSITIVE Sensitive     * PROTEUS MIRABILIS   Enterococcus species - MIC*    AMPICILLIN >=32 RESISTANT Resistant     VANCOMYCIN >=32 RESISTANT Resistant     GENTAMICIN SYNERGY SENSITIVE Sensitive     TETRACYCLINE Value in next row Resistant  RESISTANT>=16    * HEAVY GROWTH ENTEROCOCCUS SPECIES  MRSA PCR Screening     Status: None   Collection Time: 08/15/2015  2:38 PM  Result Value Ref Range Status   MRSA by PCR NEGATIVE NEGATIVE Final    Comment:        The GeneXpert MRSA Assay (FDA approved for NASAL specimens only), is one component of a comprehensive MRSA colonization surveillance program. It is not intended to diagnose MRSA infection nor to guide or monitor treatment for MRSA infections.   Blood culture (single)     Status: None   Collection Time: 08/03/2015  3:36 PM  Result Value Ref Range Status   Specimen Description BLOOD RIGHT ASSIST CONTROL  Final   Special Requests BOTTLES DRAWN AEROBIC AND ANAEROBIC  Final   Culture NO GROWTH 5 DAYS  Final   Report Status 08/17/2015 FINAL  Final  Wound culture     Status: None   Collection Time: 08/13/15 12:37 PM  Result Value Ref Range Status   Specimen Description WOUND  Final   Special Requests Normal  Final   Gram Stain   Final    FEW WBC SEEN TOO NUMEROUS TO COUNT GRAM NEGATIVE RODS FEW GRAM POSITIVE COCCI    Culture   Final    HEAVY GROWTH ESCHERICHIA COLI MODERATE GROWTH PROTEUS MIRABILIS LIGHT GROWTH KLEBSIELLA PNEUMONIAE MODERATE GROWTH ENTEROCOCCUS GALLINARUM CRITICAL RESULT CALLED TO, READ BACK BY AND VERIFIED WITH: [facility] BORBA AT 1042 08/16/15 DV    Report Status 08/17/2015 FINAL  Final   Organism ID, Bacteria ESCHERICHIA COLI  Final   Organism ID, Bacteria PROTEUS MIRABILIS   Final   Organism ID, Bacteria KLEBSIELLA PNEUMONIAE  Final   Organism ID, Bacteria ENTEROCOCCUS GALLINARUM  Final      Susceptibility   Escherichia coli - MIC*    AMPICILLIN >=32 RESISTANT Resistant     CEFTAZIDIME 4 RESISTANT Resistant     CEFAZOLIN >=64 RESISTANT Resistant     CEFTRIAXONE 16 RESISTANT Resistant     GENTAMICIN 2 SENSITIVE Sensitive     IMIPENEM >=16 RESISTANT Resistant     TRIMETH/SULFA <=20 SENSITIVE Sensitive     Extended ESBL POSITIVE Resistant     PIP/TAZO Value in next row Resistant      RESISTANT>=128    CIPROFLOXACIN Value in next row Sensitive      SENSITIVE<=0.25    * HEAVY GROWTH ESCHERICHIA COLI   Klebsiella pneumoniae - MIC*    AMPICILLIN Value in next row Resistant      SENSITIVE<=0.25    CEFTAZIDIME Value in next row Resistant      SENSITIVE<=0.25    CEFAZOLIN Value in next row Resistant      SENSITIVE<=0.25    CEFTRIAXONE Value in next row Resistant      SENSITIVE<=0.25    CIPROFLOXACIN Value in next row Resistant      SENSITIVE<=0.25    GENTAMICIN Value in next row Sensitive      SENSITIVE<=0.25    IMIPENEM Value in next row Resistant      SENSITIVE<=0.25    TRIMETH/SULFA Value in next row Resistant      SENSITIVE<=0.25    PIP/TAZO Value in next row Resistant      RESISTANT>=128    * LIGHT GROWTH KLEBSIELLA PNEUMONIAE   Proteus mirabilis - MIC*    AMPICILLIN Value in next row Resistant      RESISTANT>=128    CEFTAZIDIME Value in next row Sensitive      RESISTANT>=128  CEFAZOLIN Value in next row Sensitive      RESISTANT>=128    CEFTRIAXONE Value in next row Sensitive      RESISTANT>=128    CIPROFLOXACIN Value in next row Sensitive      RESISTANT>=128    GENTAMICIN Value in next row Sensitive      RESISTANT>=128    IMIPENEM Value in next row Sensitive      RESISTANT>=128    TRIMETH/SULFA Value in next row Sensitive      RESISTANT>=128    PIP/TAZO Value in next row Sensitive      SENSITIVE<=4    * MODERATE GROWTH PROTEUS  MIRABILIS   Enterococcus gallinarum - MIC*    AMPICILLIN Value in next row Resistant      SENSITIVE<=4    GENTAMICIN SYNERGY Value in next row Sensitive      SENSITIVE<=4    CIPROFLOXACIN Value in next row Resistant      RESISTANT>=8    TETRACYCLINE Value in next row Resistant      RESISTANT>=16    * MODERATE GROWTH ENTEROCOCCUS GALLINARUM  Wound culture     Status: None   Collection Time: 08/15/15  2:53 PM  Result Value Ref Range Status   Specimen Description WOUND  Final   Special Requests NONE  Final   Gram Stain FEW WBC SEEN NO ORGANISMS SEEN   Final   Culture NO GROWTH 3 DAYS  Final   Report Status 08/18/2015 FINAL  Final  C difficile quick scan w PCR reflex     Status: None   Collection Time: 08/17/15 11:34 AM  Result Value Ref Range Status   C Diff antigen NEGATIVE NEGATIVE Final   C Diff toxin NEGATIVE NEGATIVE Final   C Diff interpretation Negative for C. difficile  Final    Medical History: Past Medical History  Diagnosis Date  . Obesity   . Dyslipidemia   . Hypertension   . Coronary artery disease     s/p BMS 2010 LAD  . Dysrhythmia     ventricular tachycardia resolved after LAD stent and beta blocker  . Diabetes mellitus     with retinopathy, neuropathy and microalbuminemia  . ESRD (end stage renal disease) on dialysis     Medications:  Scheduled:  . sodium chloride   Intravenous Once  . sodium chloride   Intravenous Once  . DAPTOmycin (CUBICIN)  IV  500 mg Intravenous Q48H  . epoetin (EPOGEN/PROCRIT) injection  4,000 Units Intravenous Q T,Th,Sa-HD  . feeding supplement (PRO-STAT SUGAR FREE 64)  30 mL Per Tube TID  . free water  100 mL Per Tube 3 times per day  . hydrocortisone  10 mg Oral BID  . insulin aspart  0-15 Units Subcutaneous 4 times per day  . meropenem (MERREM) IV  500 mg Intravenous Q24H  . midodrine  10 mg Oral TID WC  . nystatin   Topical TID  . sodium hypochlorite   Irrigation TID   Infusions:  . feeding supplement (VITAL 1.5  CAL) 1,000 mL (08/17/15 0700)   PRN: sodium chloride, sodium chloride, alteplase, anticoagulant sodium citrate, lidocaine (PF), lidocaine-prilocaine, pentafluoroprop-tetrafluoroeth  Assessment: 49 y/o F with sepsis likely from stage IV sacral decubitus with wound growing multiple MDR organisms including VRE.   Goal of Therapy:  Resolution of infection  Plan:  Continue daptomycin 6 mg/kg q 48 hours and meropenem 500 mg iv q 24 hours.   Courtney [name] D 08/18/2015,12:28 PM

## 2015-08-18 NOTE — Progress Notes (Signed)
Spoke to Dr. Dema Severin about pt's borderline blood pressure while on dialysis. Dr. Dema Severin stated he did not want pt back on levophed. Stated to give 250 cc fluid bolus while dialysis is running. 250 cc bolus was administered from NS bag that was already hanging at [facility] rate of 10.

## 2015-08-18 NOTE — Progress Notes (Signed)
Patient examined and discussed with the wound care nurse and RN I did not personally examine the sacral decubitus as a dressing was just changed. See note from wound care nurse for detail exam on sacral and trochanteric decubiti. Patient remains with a marginal blood pressure somewhat unstable and has a platelet count of 20. I am reluctant to perform an aggressive debridement on this patient with a platelet count of 20 and with the inability to give her a general anesthesia to convert this time without adding levo fed back. We'll continue to follow and consider greater debridement and wound VAC placement should she improve over the weekend.

## 2015-08-18 NOTE — Consult Note (Addendum)
WOC wound follow up Wound type:Stage IV injury ulcer to sacum.  Bedside debridement by surgical services last week. Asked to reconsult today.  Right trochanter Stage III and right lateral foot near fifth metatarsal, unstageable injury.  All present on admission  Measurement:Sacrum:  18 cm x 12.5 cm x 4 cm with palpable bone in wound bed.  40% gray slough, 50% pale pink tissue and 10% muscle/tendon. Right lateral foot 100% dry eschar Right trochanter 3.6 cm x 1 cm x 0.1 cm 10% devitalized tissue 90% pale pink tissue.  Wound bed:see above Drainage (amount, consistency, odor) Heavy purulent drainage sacrum Minimal serosanguinous to right hip Right foot dry Periwound:Perineal redness consistent with moisture associated skin damage from incontinence Dressing procedure/placement/frequency: Cleanse right foot daily with NS and apply dry dressing.   Cleanse right hip with NS and pat gently dry.  Apply Allevyn silicone border foam.  Change every 3 days and PRn soilage Cleanse sacral ulcer with NS and pat gently dry.  Gently fill wound bed with Dakin's moistened gauze.  Cover with 4x4 gauze and ABD pad.  Change each shift and PRN soilage.   Will not follow at this time.  Please re-consult if needed.  Maple Hudson RN BSN CWON Pager [phone]

## 2015-08-18 NOTE — Progress Notes (Signed)
Reviewed over 120 cultures done at [facility] over last 2 years -prior culture data reviewed per husband request.  I have printed and given the cultures below to him.  Discussed the MDR pathogens isolated.  Discussed current antibiotics  I am available to discuss culture results and abx selection if needed  Specimen Type: Other   Tracheal Aspirate   Collected: 05/13/2015 12:27 AM   Resulting Agency: [facility]    Culture  1+ Oropharyngeal flora    3+ Pseudomonas aeruginosa (!)    Stain  <10 Epithelial cells per low power field    Large Polymorphonuclear leukocytes per low power field    1+ Gram negative rods    Rare Gram positive cocci    Rare Gram negative cocci    Susceptibility   Pseudomonas aeruginosa    MIC   Amikacin S   Cefepime S   Ceftazidime S   Ciprofloxacin R   Gentamicin I   Imipenem S   Levofloxacin R   Meropenem S   Piperacillin/Tazobactam S   Tobramycin S      Specimen Type: Urine   Urine Straight Cath   Collected: 04/22/2015 8:52 PM   Resulting Agency: [facility]    Culture  >=100,000 colonies/mL Pseudomonas aeruginosa (!)       Specific collection method (i.e. Clean catch, Straight catheter, Indwelling catheter) is required for accurate processing of urine samples. If collection method not specified, specimen processed according to Clean Catch urine protocol.    Susceptibility   Pseudomonas aeruginosa    MIC   Amikacin S   Cefepime S   Ceftazidime S   Ciprofloxacin S   Gentamicin S   Levofloxacin S   Meropenem S   Piperacillin/Tazobactam S   Tobramycin S      Specimen Type: Urine   Urine, Other   Collected: 02/25/2015 3:56 PM   Resulting Agency: [facility]    Culture  >=100,000 colonies/mL Pseudomonas aeruginosa (!)       Specific collection method (i.e. Clean catch, Straight catheter, Indwelling catheter) is required for accurate processing of urine samples. If collection method not  specified, specimen processed according to Clean Catch urine protocol.    Susceptibility   Pseudomonas aeruginosa    MIC   Amikacin I   Cefepime S   Ceftazidime S   Ciprofloxacin I   Gentamicin I   Levofloxacin I   Meropenem S   Piperacillin/Tazobactam S   Tobramycin S     Culture  Rare Growth Oropharyngeal flora    3+ Methicillin Resistant Staphylococcus aureus (!)  Predominant Organism  Stain  <10 Epithelial cells per low power field    Large Polymorphonuclear leukocytes per low power field    4+ Gram positive cocci    Susceptibility   Methicillin Resistant Staphylococcus aureus    MIC   Cefazolin R   Clindamycin S   Erythromycin R   Linezolid S   Nafcillin R   Tetracycline S 1   Trimethoprim + Sulfamethoxazole S   Vancomycin S     Specimen Type: Urine   Urine Straight Cath   Collected: 12/16/2014 7:00 PM   Resulting Agency[facility]    Culture  >=100,000 colonies/mL Klebsiella pneumoniae (!)       Specific collection method (i.e. Clean catch, Straight catheter, Indwelling catheter) is required for accurate processing of urine samples. If collection method not specified, specimen processed according to Clean Catch urine protocol.    Susceptibility  Klebsiella pneumoniae    MIC   Amikacin I   Ampicillin R   Ampicillin + Sulbactam R   Cefazolin R 1   Cefepime R   Ceftazidime R   Ceftriaxone R   Cefuroxime R   Ciprofloxacin R   Ertapenem R 2   Gentamicin R   Imipenem R   Meropenem R   Nitrofurantoin R   Piperacillin/Tazobactam R   Tetracycline S   Tobramycin R   Trimethoprim + Sulfamethoxazole R    Specimen Type: Other   ETS/ETA   Collected: 12/16/2014 5:33 AM   Resulting Agency: [facility]    Culture  Rare Growth Oropharyngeal flora    2+ Pseudomonas aeruginosa (!)  Predominant Organism  Stain  <10 Epithelial cells per low power field    Large Polymorphonuclear leukocytes    1+ Gram negative rods     Susceptibility   Pseudomonas aeruginosa    MIC   Amikacin I   Cefepime R   Ceftazidime R   Ciprofloxacin R   Gentamicin R   Imipenem S   Levofloxacin R   Meropenem I   Piperacillin/Tazobactam S   Tobramycin S    Specimen Type: Aspirate   Abscess   Collected: 11/30/2014 1:26 PM   Resulting Agency: [facility]    Culture  2+ Mixed gram negative rods (!)    2+ Citrobacter koseri (!)  Predominant Organism  Stain  1+ White blood cells    No organisms seen    Susceptibility   Citrobacter koseri    MIC   Amikacin S   Ampicillin R   Ampicillin + Sulbactam S   Cefazolin R   Cefepime S   Ceftriaxone R   Ciprofloxacin S   Ertapenem S   Gentamicin S   Imipenem S   Meropenem S   Piperacillin/Tazobactam S   Tobramycin S   Trimethoprim + Sulfamethoxazole S     Specimen Type: Blood   Blood   Collected: 11/24/2014 1:55 PM   Resulting Agency: [facility]    Culture  Citrobacter freundii complex (!)    Susceptibility   Citrobacter freundii complex    MIC   Amikacin S   Ampicillin R   Ampicillin + Sulbactam R   Cefazolin R   Ceftriaxone S   Ciprofloxacin S   Gentamicin S   Piperacillin/Tazobactam S   Tobramycin S   Trimethoprim + Sulfamethoxazole S     Specimen Type: Other   Abdominal   Collected: 08/06/2014 6:04 PM   Resulting Agency: [facility]    Culture  1+ Klebsiella pneumoniae (!)    3+ Mixed gram positive and gram negative organisms (!)    Including Yeast (!)    Stain  4+ Yeast    2+ Gram positive rods    2+ White blood cells    Susceptibility   Klebsiella pneumoniae    MIC ETEST SUCEPTIBILITY   Amikacin I    Ampicillin R    Ampicillin + Sulbactam R    Cefazolin R    Cefepime R    Ceftazidime R    Ceftriaxone R    Ciprofloxacin R    Ertapenem R 1    Gentamicin R    Imipenem R    Meropenem R    Minocycline  I   Piperacillin/Tazobactam R    Tigecycline S 2    Tobramycin R     Trimethoprim + Sulfamethoxazole R     Collected: 08/02/2014  4:37 PM   Resulting Agency: [facility]    Culture  4+ Pseudomonas aeruginosa (!)    Rare Growth Oropharyngeal flora    Stain  <10 Squamous epithelial cells per low power field    Moderate Polymorphonuclear leukocytes per low power field    2+ Gram negative rods    Susceptibility   Pseudomonas aeruginosa    MIC   Amikacin I   Cefepime R   Ceftazidime R   Ciprofloxacin R   Gentamicin I   Imipenem R   Levofloxacin R   Meropenem R   Piperacillin/Tazobactam S   Tobramycin S     Collected: 08/02/2014 9:43 AM   Resulting Agency: [facility]    Culture  Methicillin Resistant Staphylococcus aureus (!)  Rapid identification test performed by real-time PCR  Susceptibility   Methicillin Resistant Staphylococcus aureus    MIC   Cefazolin R   Clindamycin S   Daptomycin S   Erythromycin R   Linezolid S   Nafcillin R   Trimethoprim + Sulfamethoxazole S   Vancomycin S    Collected: 08/02/2014 4:41 AM   Resulting Agency: [facility]    Culture  Klebsiella pneumoniae (!)  Carbapenem Intermediate or Resistant, consult Infectious Diseases Services.  Susceptibility   Klebsiella pneumoniae    MIC ETEST SUCEPTIBILITY   Amikacin I    Ampicillin R    Ampicillin + Sulbactam R    Cefazolin R    Cefepime R    Ceftazidime R    Ceftriaxone R    Ciprofloxacin R    Ertapenem R 1    Gentamicin I    Imipenem R    Meropenem R    Minocycline  R   Piperacillin/Tazobactam R    Tigecycline S 2    Tobramycin R    Trimethoprim + Sulfamethoxazole R     Specimen Type: Blood   Blood     Collected: 07/21/2014 10:54 PM   Resulting Agency: [facility]    Culture  Methicillin Resistant Staphylococcus aureus (!)  Rapid identification test performed by real-time PCR  Susceptibility   Methicillin Resistant Staphylococcus aureus    MIC    Cefazolin R   Clindamycin S   Daptomycin S   Erythromycin R   Linezolid S   Nafcillin S   Trimethoprim + Sulfamethoxazole S   Vancomycin S    Collected: 02/24/2014 1:00 PM   Resulting Agency: [facility]    Culture  3+ Pseudomonas aeruginosa (!)    Rare Growth Oropharyngeal flora    Stain  <10 Squamous epithelial cells per low power field    Large Polymorphonuclear leukocytes per low power field    1+ Gram negative rods    Rare Gram positive cocci    Rare Gram positive rods    Susceptibility   Pseudomonas aeruginosa    MIC   Amikacin I   Cefepime S   Ceftazidime S   Ciprofloxacin R   Gentamicin I   Imipenem R   Levofloxacin R   Meropenem R   Piperacillin/Tazobactam S   Tobramycin S    ANTERIOR ABDOMINAL  Component Results  Component  Gram Stain Report  GRAM STAIN:  NO ORGANISMS SEEN, 3+ WHITE BLOOD CELLS    REPORT  ----- CULTURE FINAL REPORT -----  1+ ENTEROCOCCUS SPECIES , AMPICILLIN AND VANCOMYCIN RESISTANT.  NO ANAEROBIC ORGANISMS ISOLATED    Susceptibility  Antibiotic  Organism Organism Organism    Enterococcus  species    AMPICILLIN  R       DAPTOMYCIN  S       LINEZOLID  S       VANCOMYCIN  R

## 2015-08-18 NOTE — Progress Notes (Signed)
HD tx completed.

## 2015-08-18 NOTE — Care Management Note (Signed)
Case Management Note  Patient Details  Name: Courtney [name] MRN: [id number] Date of Birth: 1966-03-03  Subjective/Objective:  Revisited LTACH at [facility]. [facility] refuses to take patient back at this point. They have no dialysis beds and they do not have a wait list per the [facility] representative. Will have to seek return to [facility] when medically stable. Patient to be transferred to medical floor today.             Action/Plan:   Expected Discharge Date:                  Expected Discharge Plan:  Long Term Acute Care (LTAC)  In-House Referral:     Discharge planning Services  CM Consult  Post Acute Care Choice:    Choice offered to:     DME Arranged:    DME Agency:     HH Arranged:    HH Agency:     Status of Service:  In process, will continue to follow  Medicare Important Message Given:    Date Medicare IM Given:    Medicare IM give by:    Date Additional Medicare IM Given:    Additional Medicare Important Message give by:     If discussed at Long Length of Stay Meetings, dates discussed:    Additional Comments:  [name], RN 08/18/2015, 10:45 AM

## 2015-08-19 DIAGNOSIS — G92 Toxic encephalopathy: Secondary | ICD-10-CM

## 2015-08-19 LAB — GLUCOSE, CAPILLARY
GLUCOSE-CAPILLARY: 185 mg/dL — AB (ref 65–99)
GLUCOSE-CAPILLARY: 135 mg/dL — AB (ref 65–99)
Glucose-Capillary: 202 mg/dL — ABNORMAL HIGH (ref 65–99)
Glucose-Capillary: 148 mg/dL — ABNORMAL HIGH (ref 65–99)
Glucose-Capillary: 138 mg/dL — ABNORMAL HIGH (ref 65–99)
Glucose-Capillary: 116 mg/dL — ABNORMAL HIGH (ref 65–99)

## 2015-08-19 LAB — BASIC METABOLIC PANEL
ANION GAP: 3 — AB (ref 5–15)
BUN: 28 mg/dL — ABNORMAL HIGH (ref 6–20)
CALCIUM: 7.7 mg/dL — AB (ref 8.9–10.3)
CO2: 33 mmol/L — ABNORMAL HIGH (ref 22–32)
Chloride: 105 mmol/L (ref 101–111)
Creatinine, Ser: 1.03 mg/dL — ABNORMAL HIGH (ref 0.44–1.00)
Glucose, Bld: 162 mg/dL — ABNORMAL HIGH (ref 65–99)
Potassium: 3.8 mmol/L (ref 3.5–5.1)
SODIUM: 141 mmol/L (ref 135–145)

## 2015-08-19 LAB — CBC
HEMATOCRIT: 26.3 % — AB (ref 35.0–47.0)
HEMOGLOBIN: 8.4 g/dL — AB (ref 12.0–16.0)
MCH: 29.9 pg (ref 26.0–34.0)
MCHC: 31.8 g/dL — ABNORMAL LOW (ref 32.0–36.0)
MCV: 93.9 fL (ref 80.0–100.0)
Platelets: 34 10*3/uL — ABNORMAL LOW (ref 150–440)
RBC: 2.8 MIL/uL — AB (ref 3.80–5.20)
RDW: 16.4 % — AB (ref 11.5–14.5)
WBC: 9.1 10*3/uL (ref 3.6–11.0)

## 2015-08-19 MED ORDER — VITAL 1.5 CAL PO LIQD
1000.0000 mL | ORAL | Status: DC
  Administered 2015-08-19 – 2015-08-22 (×4): 1000 mL

## 2015-08-19 MED ORDER — ADULT MULTIVITAMIN LIQUID CH
5.0000 mL | Freq: Every day | ORAL | Status: DC
  Administered 2015-08-19 – 2015-09-17 (×28): 5 mL
  Filled 2015-08-19 (×31): qty 5

## 2015-08-19 MED ORDER — PRO-STAT SUGAR FREE PO LIQD
30.0000 mL | ORAL | Status: DC
  Administered 2015-08-19 – 2015-08-23 (×16): 30 mL

## 2015-08-19 MED ORDER — INSULIN ASPART 100 UNIT/ML ~~LOC~~ SOLN
0.0000 [IU] | SUBCUTANEOUS | Status: DC
  Administered 2015-08-19 – 2015-08-20 (×3): 2 [IU] via SUBCUTANEOUS
  Administered 2015-08-20: 1 [IU] via SUBCUTANEOUS
  Administered 2015-08-21 (×2): 2 [IU] via SUBCUTANEOUS
  Administered 2015-08-22 (×2): 3 [IU] via SUBCUTANEOUS
  Administered 2015-08-22: 2 [IU] via SUBCUTANEOUS
  Administered 2015-08-23: 5 [IU] via SUBCUTANEOUS
  Administered 2015-08-23: 3 [IU] via SUBCUTANEOUS
  Administered 2015-08-27 – 2015-08-31 (×4): 2 [IU] via SUBCUTANEOUS
  Filled 2015-08-19: qty 2
  Filled 2015-08-19 (×2): qty 3
  Filled 2015-08-19 (×5): qty 2
  Filled 2015-08-19: qty 1
  Filled 2015-08-19 (×2): qty 2
  Filled 2015-08-19: qty 5
  Filled 2015-08-19: qty 1
  Filled 2015-08-19: qty 2
  Filled 2015-08-19: qty 1

## 2015-08-19 MED ORDER — VITAL 1.5 CAL PO LIQD
1000.0000 mL | ORAL | Status: DC
  Administered 2015-08-19: 1000 mL

## 2015-08-19 NOTE — Progress Notes (Signed)
Subjective:  Pt still in CCU. Remains critically ill Tube feeds going @ 50. Patient is coughing, with increased secretions Rectal tube in place Opens eyes to verbal commands BP was low during HD. No fluid could be removed Today, her BP is better. She is opening her eyes and able to look around    Objective:  Vital signs in last 24 hours:  Temp:  [97.1 F (36.2 C)-98 F (36.7 C)] 97.1 F (36.2 C) (09/30 0800) Pulse Rate:  [94-120] 109 (09/30 0900) Resp:  [2-31] 21 (09/30 0900) BP: (71-144)/(35-88) 131/63 mmHg (09/30 0900) SpO2:  [95 %-100 %] 100 % (09/30 0900) FiO2 (%):  [28 %] 28 % (09/30 0830) Weight:  [81.7 kg (180 lb 1.9 oz)-84.4 kg (186 lb 1.1 oz)] 84.4 kg (186 lb 1.1 oz) (09/30 0557)  Weight change:  Filed Weights   08/18/15 0830 08/18/15 1240 08/19/15 0557  Weight: 81.2 kg (179 lb 0.2 oz) 81.7 kg (180 lb 1.9 oz) 84.4 kg (186 lb 1.1 oz)    Intake/Output: I/O last 3 completed shifts: In: 3135 [I.V.:480; NG/GT:2440; IV Piggyback:215] Out: 200 [Stool:700]   Intake/Output this shift:  Total I/O In: 150 [NG/GT:150] Out: -   Physical Exam: General: Chronically ill appearing  Head: Normocephalic, atraumatic. Moist oral mucosal membranes  Eyes: Eyes closed  Neck: Trach in place  Lungs:  Scattered rhonchi, normal effort  Heart: S1S2 no rubs  Abdomen:  Soft, nontender, BS present  Extremities:  + dependent and peripheral edema, b/l feet in soft support  Neurologic: Lethargic, opens eyes to verbal commands  Skin: No lesions  Access: R IJ permcath    Basic Metabolic Panel:  Recent Labs Lab 08/13/15 0250  08/14/15 1203 08/15/15 0530 08/16/15 0422 08/17/15 0446 08/17/15 1350 08/18/15 0507 08/18/15 0939  NA 138  < >  --  141 142 141 142 140 136  K 3.4*  < >  --  4.0 3.8 3.0* 3.8 4.0 3.8  CL 103  < >  --  104 105 105 104 104 102  CO2 28  < >  --  32 31 31 33* 32 31  GLUCOSE 123*  < >  --  76 234* 148* 118* 136* 132*  BUN 19  < >  --  17 25* 18 23* 31* 31*   CREATININE 1.41*  < >  --  1.11* 1.45* 0.94 1.10* 1.24* 1.30*  CALCIUM 7.5*  < >  --  7.8* 7.6* 7.2* 7.6* 7.6* 7.3*  MG 1.8  --   --   --  1.7 1.7  --   --   --   PHOS  --   --  1.6* 1.6* 3.4  --   --   --  2.0*  < > = values in this interval not displayed.  Liver Function Tests:  Recent Labs Lab 08/18/15 0507 08/18/15 0939  AST 23  --   ALT 22  --   ALKPHOS 384*  --   BILITOT 1.0  --   PROT 4.4*  --   ALBUMIN 1.7* 1.7*   No results for input(s): LIPASE, AMYLASE in the last 168 hours. No results for input(s): AMMONIA in the last 168 hours.  CBC:  Recent Labs Lab 08/14/15 1747  08/16/15 0422 08/17/15 0446 08/18/15 0507 08/18/15 0939 08/19/15 0548  WBC 14.8*  < > 9.4 6.7 7.5 7.2 9.1  NEUTROABS 14.0*  --   --  5.4  --   --   --   HGB  6.8*  < > 8.5* 8.3* 8.4* 8.1* 8.4*  HCT 21.4*  < > 26.3* 25.5* 26.0* 25.5* 26.3*  MCV 92.8  < > 92.5 92.6 93.0 93.0 93.9  PLT 43*  < > 15* 17* 20* 20* 34*  < > = values in this interval not displayed.  Cardiac Enzymes: No results for input(s): CKTOTAL, CKMB, CKMBINDEX, TROPONINI in the last 168 hours.  BNP: Invalid input(s): POCBNP  CBG:  Recent Labs Lab 08/18/15 1601 08/18/15 1801 08/18/15 2223 08/19/15 0554 08/19/15 0729  GLUCAP 135* 141* 117* 202* 185*    Microbiology: Results for orders placed or performed during the hospital encounter of 09-02-15  Blood Culture (routine x 2)     Status: None   Collection Time: 2015/09/02  8:51 AM  Result Value Ref Range Status   Specimen Description BLOOD [name]  Final   Special Requests BOTTLES DRAWN AEROBIC AND ANAEROBIC  3CC  Final   Culture NO GROWTH 5 DAYS  Final   Report Status 08/17/2015 FINAL  Final  Blood Culture (routine x 2)     Status: None   Collection Time: Sep 02, 2015  9:20 AM  Result Value Ref Range Status   Specimen Description BLOOD LEFT ARM  Final   Special Requests BOTTLES DRAWN AEROBIC AND ANAEROBIC  1CC  Final   Culture NO GROWTH 5 DAYS  Final   Report Status  08/17/2015 FINAL  Final  Wound culture     Status: None   Collection Time: 09-02-2015  9:20 AM  Result Value Ref Range Status   Specimen Description DECUBITIS  Final   Special Requests Normal  Final   Gram Stain   Final    FEW WBC SEEN MANY GRAM NEGATIVE RODS RARE GRAM POSITIVE COCCI    Culture   Final    HEAVY GROWTH ESCHERICHIA COLI MODERATE GROWTH ENTEROBACTER AEROGENES PROTEUS MIRABILIS HEAVY GROWTH ENTEROCOCCUS SPECIES VRE HAVE INTRINSIC RESISTANCE TO MOST COMMONLY USED ANTIBIOTICS AND THE ABILITY TO ACQUIRE RESISTANCE TO MOST AVAILABLE ANTIBIOTICS.    Report Status 08/16/2015 FINAL  Final   Organism ID, Bacteria ESCHERICHIA COLI  Final   Organism ID, Bacteria ENTEROBACTER AEROGENES  Final   Organism ID, Bacteria PROTEUS MIRABILIS  Final   Organism ID, Bacteria ENTEROCOCCUS SPECIES  Final      Susceptibility   Enterobacter aerogenes - MIC*    CEFTAZIDIME <=1 SENSITIVE Sensitive     CEFAZOLIN >=64 RESISTANT Resistant     CEFTRIAXONE <=1 SENSITIVE Sensitive     CIPROFLOXACIN <=0.25 SENSITIVE Sensitive     GENTAMICIN <=1 SENSITIVE Sensitive     IMIPENEM 1 SENSITIVE Sensitive     TRIMETH/SULFA <=20 SENSITIVE Sensitive     * MODERATE GROWTH ENTEROBACTER AEROGENES   Escherichia coli - MIC*    AMPICILLIN <=2 SENSITIVE Sensitive     CEFTAZIDIME <=1 SENSITIVE Sensitive     CEFAZOLIN <=4 SENSITIVE Sensitive     CEFTRIAXONE <=1 SENSITIVE Sensitive     CIPROFLOXACIN <=0.25 SENSITIVE Sensitive     GENTAMICIN <=1 SENSITIVE Sensitive     IMIPENEM <=0.25 SENSITIVE Sensitive     TRIMETH/SULFA <=20 SENSITIVE Sensitive     * HEAVY GROWTH ESCHERICHIA COLI   Proteus mirabilis - MIC*    AMPICILLIN >=32 RESISTANT Resistant     CEFTAZIDIME <=1 SENSITIVE Sensitive     CEFAZOLIN 8 SENSITIVE Sensitive     CEFTRIAXONE <=1 SENSITIVE Sensitive     CIPROFLOXACIN <=0.25 SENSITIVE Sensitive     GENTAMICIN <=1 SENSITIVE Sensitive     IMIPENEM  1 SENSITIVE Sensitive     TRIMETH/SULFA <=20 SENSITIVE  Sensitive     * PROTEUS MIRABILIS   Enterococcus species - MIC*    AMPICILLIN >=32 RESISTANT Resistant     VANCOMYCIN >=32 RESISTANT Resistant     GENTAMICIN SYNERGY SENSITIVE Sensitive     TETRACYCLINE Value in next row Resistant      RESISTANT>=16    * HEAVY GROWTH ENTEROCOCCUS SPECIES  MRSA PCR Screening     Status: None   Collection Time: 08/10/2015  2:38 PM  Result Value Ref Range Status   MRSA by PCR NEGATIVE NEGATIVE Final    Comment:        The GeneXpert MRSA Assay (FDA approved for NASAL specimens only), is one component of a comprehensive MRSA colonization surveillance program. It is not intended to diagnose MRSA infection nor to guide or monitor treatment for MRSA infections.   Blood culture (single)     Status: None   Collection Time: 07/27/2015  3:36 PM  Result Value Ref Range Status   Specimen Description BLOOD RIGHT ASSIST CONTROL  Final   Special Requests BOTTLES DRAWN AEROBIC AND ANAEROBIC  Final   Culture NO GROWTH 5 DAYS  Final   Report Status 08/17/2015 FINAL  Final  Wound culture     Status: None   Collection Time: 08/13/15 12:37 PM  Result Value Ref Range Status   Specimen Description WOUND  Final   Special Requests Normal  Final   Gram Stain   Final    FEW WBC SEEN TOO NUMEROUS TO COUNT GRAM NEGATIVE RODS FEW GRAM POSITIVE COCCI    Culture   Final    HEAVY GROWTH ESCHERICHIA COLI MODERATE GROWTH PROTEUS MIRABILIS LIGHT GROWTH KLEBSIELLA PNEUMONIAE MODERATE GROWTH ENTEROCOCCUS GALLINARUM CRITICAL RESULT CALLED TO, READ BACK BY AND VERIFIED WITH: [facility] BORBA AT 1042 08/16/15 DV    Report Status 08/17/2015 FINAL  Final   Organism ID, Bacteria ESCHERICHIA COLI  Final   Organism ID, Bacteria PROTEUS MIRABILIS  Final   Organism ID, Bacteria KLEBSIELLA PNEUMONIAE  Final   Organism ID, Bacteria ENTEROCOCCUS GALLINARUM  Final      Susceptibility   Escherichia coli - MIC*    AMPICILLIN >=32 RESISTANT Resistant     CEFTAZIDIME 4 RESISTANT Resistant      CEFAZOLIN >=64 RESISTANT Resistant     CEFTRIAXONE 16 RESISTANT Resistant     GENTAMICIN 2 SENSITIVE Sensitive     IMIPENEM >=16 RESISTANT Resistant     TRIMETH/SULFA <=20 SENSITIVE Sensitive     Extended ESBL POSITIVE Resistant     PIP/TAZO Value in next row Resistant      RESISTANT>=128    CIPROFLOXACIN Value in next row Sensitive      SENSITIVE<=0.25    * HEAVY GROWTH ESCHERICHIA COLI   Klebsiella pneumoniae - MIC*    AMPICILLIN Value in next row Resistant      SENSITIVE<=0.25    CEFTAZIDIME Value in next row Resistant      SENSITIVE<=0.25    CEFAZOLIN Value in next row Resistant      SENSITIVE<=0.25    CEFTRIAXONE Value in next row Resistant      SENSITIVE<=0.25    CIPROFLOXACIN Value in next row Resistant      SENSITIVE<=0.25    GENTAMICIN Value in next row Sensitive      SENSITIVE<=0.25    IMIPENEM Value in next row Resistant      SENSITIVE<=0.25    TRIMETH/SULFA Value in next row Resistant  SENSITIVE<=0.25    PIP/TAZO Value in next row Resistant      RESISTANT>=128    * LIGHT GROWTH KLEBSIELLA PNEUMONIAE   Proteus mirabilis - MIC*    AMPICILLIN Value in next row Resistant      RESISTANT>=128    CEFTAZIDIME Value in next row Sensitive      RESISTANT>=128    CEFAZOLIN Value in next row Sensitive      RESISTANT>=128    CEFTRIAXONE Value in next row Sensitive      RESISTANT>=128    CIPROFLOXACIN Value in next row Sensitive      RESISTANT>=128    GENTAMICIN Value in next row Sensitive      RESISTANT>=128    IMIPENEM Value in next row Sensitive      RESISTANT>=128    TRIMETH/SULFA Value in next row Sensitive      RESISTANT>=128    PIP/TAZO Value in next row Sensitive      SENSITIVE<=4    * MODERATE GROWTH PROTEUS MIRABILIS   Enterococcus gallinarum - MIC*    AMPICILLIN Value in next row Resistant      SENSITIVE<=4    GENTAMICIN SYNERGY Value in next row Sensitive      SENSITIVE<=4    CIPROFLOXACIN Value in next row Resistant      RESISTANT>=8     TETRACYCLINE Value in next row Resistant      RESISTANT>=16    * MODERATE GROWTH ENTEROCOCCUS GALLINARUM  Wound culture     Status: None   Collection Time: 08/15/15  2:53 PM  Result Value Ref Range Status   Specimen Description WOUND  Final   Special Requests NONE  Final   Gram Stain FEW WBC SEEN NO ORGANISMS SEEN   Final   Culture NO GROWTH 3 DAYS  Final   Report Status 08/18/2015 FINAL  Final  C difficile quick scan w PCR reflex     Status: None   Collection Time: 08/17/15 11:34 AM  Result Value Ref Range Status   C Diff antigen NEGATIVE NEGATIVE Final   C Diff toxin NEGATIVE NEGATIVE Final   C Diff interpretation Negative for C. difficile  Final    Coagulation Studies:  Recent Labs  08/16/15 1323  LABPROT 18.1*  INR 1.48    Urinalysis: No results for input(s): COLORURINE, LABSPEC, PHURINE, GLUCOSEU, HGBUR, BILIRUBINUR, KETONESUR, PROTEINUR, UROBILINOGEN, NITRITE, LEUKOCYTESUR in the last 72 hours.  Invalid input(s): APPERANCEUR    Imaging: Dg Chest Port 1 View  08/18/2015   CLINICAL DATA:  Acute pulmonary edema  EXAM: PORTABLE CHEST 1 VIEW  COMPARISON:  08/13/2015  FINDINGS: Tracheostomy tube is appropriately positioned. Feeding tube tip terminates over the expected location of the second portion of the duodenum. Right IJ approach hemodialysis catheter terminates over the right atrium. Moderate enlargement of the cardiomediastinal silhouette is reidentified with curvilinear right lower lobe presumed atelectasis. Trace pleural fluid.  IMPRESSION: Cardiomegaly without focal acute finding.   Electronically Signed   By: Christiana Pellant M.D.   On: 08/18/2015 18:52     Medications:   . feeding supplement (VITAL 1.5 CAL) 1,000 mL (08/18/15 1336)   . sodium chloride   Intravenous Once  . sodium chloride   Intravenous Once  . acidophilus  1 capsule Oral Daily  . DAPTOmycin (CUBICIN)  IV  500 mg Intravenous Q48H  . epoetin (EPOGEN/PROCRIT) injection  4,000 Units Intravenous  Q T,Th,Sa-HD  . feeding supplement (PRO-STAT SUGAR FREE 64)  30 mL Per Tube TID  . free water  100 mL Per Tube 3 times per day  . hydrocortisone  10 mg Oral BID  . insulin aspart  0-15 Units Subcutaneous 4 times per day  . meropenem (MERREM) IV  500 mg Intravenous Q24H  . midodrine  10 mg Oral TID WC  . nystatin   Topical TID  . sodium hypochlorite   Irrigation BID   sodium chloride, sodium chloride, alteplase, anticoagulant sodium citrate, lidocaine (PF), lidocaine-prilocaine, pentafluoroprop-tetrafluoroeth  Assessment/ Plan:  49 y.o. 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  1. End-stage renal disease on hemodialysis on HD TTHS. The patient has been on dialysis since October of 2014.  R IJ permcath.  -trial of HD today,  LOW BFR,  - if unsuccessful in removing fluid, will consider CRRT  2. Anemia of CKD: received blood transfusion this admission. - continue EPO  3. Secondary hyperparathyroidism: phos low at 2.0. Replaced per icu protocol  4. Septic shock: Multiple potential sources, has two catheters in place, also has a sacral decubitis, and also has trach in place.  - multiple organisms growing via wound culture - appreciate ID recommendations  5. peripheral edema - UF with HD with iv albumin support as tolerated   6.  Thrombocytopenia:  Platelets declining, defer further work up to [employer] and hospitalist.  pack catheter with citrate  Case d/w patient's  husband Jonny Ruiz, Dr Dema Severin   LOS: 7 SINGH,HARMEET 9/30/20169:37 AM

## 2015-08-19 NOTE — Progress Notes (Signed)
PULMONARY / CRITICAL CARE MEDICINE   Name: Courtney [name] MRN: [id number] DOB: [birth year]    ADMISSION DATE:  08/18/2015 CONSULTATION DATE: 9/23  INITIAL PRESENTATION:  82 F who has been in medical facilities (hosp, LTAC, rehab) for 2 yrs following gastric bypass surgery with multiple complications. Now with chronic trach, ESRD, profound debilitation, severe sacral pressure ulcer. Was seemingly making progress and transferred to rehab facility approx one week prior to this admission. She was sent to [facility] ED with AMS and hypotension. Working dx of severe sepsis/septic shock due to infected sacral pressure ulcer. Also has R side externalized HD cath and tunneled L IJ CVL. She has history of resistant bacterial infections. She is on chronic steroids. Her husband indicates that she has been diagnosed with adrenal insuff at some point during the past 2 yrs  MAJOR EVENTS/TEST RESULTS: 9/23 CT head: NAD 9/23 EEG: no epileptiform activity 9/23 PRBCs for Hgb 6.4 9/24 bedside debridement of sacral wound. Abscess drained 9/25 Off vasopressors. More alert. No distress. Worsening thrombocytopenia. Vanc DC'd  INDWELLING DEVICES:: Trach (chronic)  Tunneled R IJ HD cath (chronic) Tunneled L IJ CVL (chronic) L femoral A-line 9/23 >> 9/25  MICRO DATA: MRSA PCR 9/23 >> NEG Urine 9/23 >>  Wound (swab) 9/23 >> many GNR, rare GPC >>  Blood 9/23 >> neg  Wound (debridement) 9/24 >> heavy GNR >> CRE, Enterococcus, K. Pneumoniae, P. Mirabilis, VRE CDiff 9/27>>neg   ANTIMICROBIALS:  Aztreonam 9/23 >> 9/24 Vanc 9/23 >> 9/25 Vanc 9/26>>9/27 Meropenem 9/23 >>  Daptomycin 9/27>>   SUBJECTIVE:  RASS, -1.  Nothing purposeful at this time, off pressors, Bp has improved since yesterday On Trach collar at 28%, having moderate secretions today.  Open eyes, but not tracking, husband at bedside Plt count mildly increasing   VITAL SIGNS: Temp:  [97.1 F (36.2 C)-98 F (36.7 C)] 97.1 F (36.2 C)  (09/30 0800) Pulse Rate:  [94-120] 112 (09/30 1030) Resp:  [2-31] 30 (09/30 1030) BP: (88-144)/(36-88) 135/60 mmHg (09/30 1030) SpO2:  [95 %-100 %] 98 % (09/30 1115) FiO2 (%):  [28 %] 28 % (09/30 1115) Weight:  [180 lb 1.9 oz (81.7 kg)-186 lb 1.1 oz (84.4 kg)] 186 lb 1.1 oz (84.4 kg) (09/30 0557) HEMODYNAMICS:   VENTILATOR SETTINGS: Vent Mode:  [-]  FiO2 (%):  [28 %] 28 % INTAKE / OUTPUT:  Intake/Output Summary (Last 24 hours) at 08/19/15 1156 Last data filed at 08/19/15 1000  Gross per 24 hour  Intake   2030 ml  Output    200 ml  Net   1830 ml    PHYSICAL EXAMINATION: General: RASS 0, -1, + F/C, no respiratory distress Neuro: CNs intact, MAEs, DTRs symmetric HEENT: Cushingoid facies Neck: no JVD noted, trach site clean Cardiovascular: Regular, no M noted Lungs: No adventitious sounds Abdomen: Obese, soft, NT, diminished BS Ext: cool, diminished distal pulses, no edema Skin: very deep, malodorous sacral pressure ulcer  LABS:  CBC  Recent Labs Lab 08/18/15 0507 08/18/15 0939 08/19/15 0548  WBC 7.5 7.2 9.1  HGB 8.4* 8.1* 8.4*  HCT 26.0* 25.5* 26.3*  PLT 20* 20* 34*   Coag's  Recent Labs Lab 08/16/15 1323  APTT 82*  INR 1.48   BMET  Recent Labs Lab 08/17/15 1350 08/18/15 0507 08/18/15 0939  NA 142 140 136  K 3.8 4.0 3.8  CL 104 104 102  CO2 33* 32 31  BUN 23* 31* 31*  CREATININE 1.10* 1.24* 1.30*  GLUCOSE 118* 136* 132*  Electrolytes  Recent Labs Lab 08/13/15 0250  08/15/15 0530 08/16/15 0422 08/17/15 0446 08/17/15 1350 08/18/15 0507 08/18/15 0939  CALCIUM 7.5*  < > 7.8* 7.6* 7.2* 7.6* 7.6* 7.3*  MG 1.8  --   --  1.7 1.7  --   --   --   PHOS  --   < > 1.6* 3.4  --   --   --  2.0*  < > = values in this interval not displayed. Sepsis Markers  Recent Labs Lab 08/11/2015 1605  LATICACIDVEN 1.5   ABG  Recent Labs Lab 08/15/15 0850 08/16/15 0521 08/17/15 1154  PHART 7.47* 7.46* 7.48*  PCO2ART 44 44 45  PO2ART 65* 92 111*    Liver Enzymes  Recent Labs Lab 08/18/15 0507 08/18/15 0939  AST 23  --   ALT 22  --   ALKPHOS 384*  --   BILITOT 1.0  --   ALBUMIN 1.7* 1.7*   Cardiac Enzymes No results for input(s): TROPONINI, PROBNP in the last 168 hours. 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 HD 9/27  GASTROINTESTINAL A:  Loose stool - back on TF  Hx of C. Diff - PCR neg P:   SUP: N/I unless requires vent support Begin D III diet with supervision 9/25 Check c.diff PCR>>neg TF goal rate reduced to 35, due to have moderate secretions and concerns for aspiration   HEMATOLOGIC A:   Acute on chronic anemia - unclear if acute blood loss Thrombocytopenia, worsening. Etiology unclear. Was on heparin PTA P:  DVT px: SCDs Monitor CBC intermittently Transfuse per usual guidelines Check HIT panel 9/25 - pending Might need platelet transfusion prior to next debridement  INFECTIOUS A:   Severe sepsis Sacral decubitus ulcer with abscess P:   Monitor temp, WBC count Micro and abx as above Surgery following ID recs appreciated - multiple MDR organisms, but improving slowly with current antibiotic selection, we require extensive wound care.   ENDOCRINE A:  DM 2, controlled Chronic  prednisone therapy - chronic dose 10 mg BID Secondary adrenal insuff P:   CBGs and SSI - mod scale Stress dose steroids - wean as able based on BP  NEUROLOGIC A:   Acute encephalopathy, resolving-somnolent still P:   RASS goal: 0 Minimize sedating meds - baseline mentation is talking and following commands.  - husband concerned about patient mentation not improving, concerned about sacral ulcer organisms spreading to blood or possible CSF >> will consider MRI to evaluate for abscess  FAMILY:  - updating  husband daily on current status ([phone]), husband updated at bedside today - husband requested that when patient is more stable that she be transferred back to [facility]  I have personally obtained a history, examined the patient, evaluated laboratory and imaging results, formulated the  assessment and plan and placed orders. CRITICAL CARE: The patient is critically ill with multiple organ systems failure and requires high complexity decision making for assessment and support, frequent evaluation and titration of therapies, application of advanced monitoring technologies and extensive interpretation of multiple databases. Critical Care Time devoted to patient care services described in this note is 35 minutes.    Stephanie Acre, MD [facility] Pager [phone] (please enter 7-digits) On Call Pager - [phone] (please enter 7-digits)      08/19/2015, 11:56 AM

## 2015-08-19 NOTE — Progress Notes (Signed)
[facility] [place] [facility]   PATIENT NAME: Courtney [name]    MR#:  [id number]  DATE OF BIRTH:  1966-10-28  SUBJECTIVE:  CHIEF COMPLAINT:  Patient is encephalopathic , sometimes opens her eyes to verbal commands but not communicating.  pt on tube feeds - Vital , flexiseal , getting HD at bedside today  REVIEW OF SYSTEMS:  Unable to obtain review of systems DRUG ALLERGIES:   Allergies  Allergen Reactions  . Contrast Media [Iodinated Diagnostic Agents] Anaphylaxis  . Ampicillin Rash    VITALS:  Blood pressure 135/60, pulse 112, temperature 97.1 F (36.2 C), temperature source Axillary, resp. rate 30, height  (1.753 m), weight 84.4 kg (186 lb 1.1 oz), SpO2 98 %.  PHYSICAL EXAMINATION:  GENERAL:  49 y.o.-year-old patient lying in the bed with no acute distress.  EYES: Pupils equal, round, sluggishly reactive to light and accommodation No scleral icterus. HEENT:  Normocephalic. Oropharynx and nasopharynx clear. Trach site is intact NECK:  Supple, no jugular venous distention. No thyroid enlargement, no tenderness.  LUNGS: Moderate air entry, decreased breath sounds at the bases, no wheezing, rales,rhonchi or crepitation. No use of accessory muscles of respiration.  CARDIOVASCULAR: S1, S2 normal. No murmurs, rubs, or gallops.  ABDOMEN: Soft, nondistended. Bowel sounds present.  EXTREMITIES: peripheral  edema, no cyanosis, or clubbing.  NEUROLOGIC: Patient is with altered mental status  PSYCHIATRIC: The patient is disoriented SKIN: Large sacral decubitus ulcer . Petechiae on extremities. Peripheral edema is present   LABORATORY PANEL:   CBC  Recent Labs Lab 08/19/15 0548  WBC 9.1  HGB 8.4*  HCT 26.3*  PLT 34*   ------------------------------------------------------------------------------------------------------------------  Chemistries   Recent Labs Lab 08/17/15 0446  08/18/15 0507  08/19/15 1137  NA 141  < > 140  < >  141  K 3.0*  < > 4.0  < > 3.8  CL 105  < > 104  < > 105  CO2 31  < > 32  < > 33*  GLUCOSE 148*  < > 136*  < > 162*  BUN 18  < > 31*  < > 28*  CREATININE 0.94  < > 1.24*  < > 1.03*  CALCIUM 7.2*  < > 7.6*  < > 7.7*  MG 1.7  --   --   --   --   AST  --   --  23  --   --   ALT  --   --  22  --   --   ALKPHOS  --   --  384*  --   --   BILITOT  --   --  1.0  --   --   < > = values in this interval not displayed. ------------------------------------------------------------------------------------------------------------------  Cardiac Enzymes No results for input(s): TROPONINI in the last 168 hours. ------------------------------------------------------------------------------------------------------------------  RADIOLOGY:  Dg Chest Port 1 View  08/18/2015   CLINICAL DATA:  Acute pulmonary edema  EXAM: PORTABLE CHEST 1 VIEW  COMPARISON:  08/13/2015  FINDINGS: Tracheostomy tube is appropriately positioned. Feeding tube tip terminates over the expected location of the second portion of the duodenum. Right IJ approach hemodialysis catheter terminates over the right atrium. Moderate enlargement of the cardiomediastinal silhouette is reidentified with curvilinear right lower lobe presumed atelectasis. Trace pleural fluid.  IMPRESSION: Cardiomegaly without focal acute finding.   Electronically Signed   By: Christiana Pellant M.D.   On: 08/18/2015 18:52    EKG:   Orders  placed or performed in visit on 08/13/2015  . EKG 12-Lead  . EKG 12-Lead  . EKG 12-Lead    ASSESSMENT AND PLAN:   1. Sepsis . Probably from stage IV foul-smelling sacral decubiti with multiple drug resistant organisms Patient is started on Daptomycin and  continue meropenem and discontinued vancomycin, cefepime and Levaquin. Follow-up  blood Culture with no growth so far Wound culture with Escherichia coli, Proteus mirabilis, Klebsiella pneumonia and enterococcus gallinarum off  levophed as blood pressure is better. Hold all  antihypertensives medication Appreciate Critical care/ID recommendations.    2. Acute encephalopathy- likely from problem #1 Continue current antibiotics daptomycin and meropenem Initial CT scan was negative.  Patient has no history of seizure. No witnessed seizures Status post EEG  Appreciate neurology recommendations  3. symptomatic anemia-   status post1 unit of blood transfusion and hemoglobin is at 8.4 today. Will repeat CBC in a.m  4. Thrombocytopenia- no acute bleeding. Platelet count is at 34,000 today.Will hold off on heparin subcutaneous  Transfuse platelets if platelet count is less than or equal to 10,000 HIT/DIC panel and results are pending Oncology recommendations are appreciated Nephrolo agy stopped  using heparin during dialysis, discussed with nephrology they would rather use citrate instead of heparin  5. End-stage renal disease on hemodialysis- patient is getting hemodialysis today   6. Oxygenate with trach collar at this point 7. Elevated troponin likely demand ischemia with septic shock 8. Stage IV sacral decubitus- status post bedside wound debridement. Appreciate surgery recommendations. Dr Excell Seltzer is planning to do debridement again and planning to place wound VAC over weekend, will continue wound care  9. Hypokalemia and hypomagnesemia- replace potassium and magnesium IV. Check magnesium 10. Severe protein energy malnutrition - tube feeds with vital decreased to 30 mL per hour as per husband's request 11. Diarrhea-can be antibiotic induced as well as from tube feeds-flexi seal. Start probiotic   All the records are reviewed and case discussed with Care Management/Social Workerr. I have discussed the plan of care at length with patient's husband  Mr. [name], he would like to make a decision on transferring the patient to tertiary care center , [facility] on Sunday. Not considering transfer at this time today. He is aware that patient is severely  malnourished, has very low platelet count is at high risk for bleeding and plastic surgery services are unavailable at [facility].  CODE STATUS: Full code  TOTAL CRITICAL CARE TIME TAKING CARE OF THIS PATIENT: 45 minutes.  Greater than 50% time was spent on face-to-face counseling, discussing with specialists and coordination of care     Ramonita Lab M.D on 08/19/2015 at 2:08 PM  Between 7am to 6pm - Pager - [phone] After 6pm go to [email or web address] - password [email or web address] [facility]  [facility]  Office  [phone]  CC: Primary care physician; [name], MD

## 2015-08-19 NOTE — Progress Notes (Addendum)
Nutrition Follow-up    INTERVENTION:   EN: discussed with MD Mungal; MD requesting that TF be decreased to rate of 35 ml/hr due to increased secretions. MD is aware that decreasing TF will not meet nutritional needs; pt with increased needs due to chronic HD, chronic stage IV pressure ulcer requiring surgical intervention, acute illness, diarrhea, gastric bypass (sleeve gastrectomy). At rate of 35 ml/hr, TF provides 1260 kcals, 57 g of protein. Current TF formula is calorically dense (1.5 kcal/ml) and is also peptide-based formula for pt's experiencing malabsorption/maldigestion or impaired GI function/GI intolerance. With decrease in TF rate, recommend increasing Pro-stat (sugar free) frequency to 6 times daily; total from TF and Prostat supplement is 147 g of protein, 1860 kcals. Recommend addition of MVI due to reduced TF rate in pt with chronic wound and hx of gastric bypass.    NUTRITION DIAGNOSIS:   Inadequate oral intake related to wound healing, acute illness, chronic illness as evidenced by NPO status, estimated needs.  GOAL:   Patient will meet greater than or equal to 90% of their needs  MONITOR:    (Energy Intake, Anthropometrics, Digestive System, Electrolyte/Renal Profile, Glucose Profile)  REASON FOR ASSESSMENT:   Consult Enteral/tube feeding initiation and management  ASSESSMENT:    Pt remains on trach collar; noted surgery recommending possible tertiary care transfer for wound management, receiving scheduled HD; per report, pt with increased secretions this AM  EN: Vital 1.5 TF infusing at rate of 50 ml/hr, Prostat TID  Skin:   (stage IV ulcer on foot, unstageable on sacrum)  Last BM:  700 mL liquid stool via flexiseal documented  Meds: acidophilus, daptomycin, ss novolog, meropenum, midodrine  Height:   Ht Readings from Last 1 Encounters:  09/06/15  (1.753 m)    Weight:   Wt Readings from Last 1 Encounters:  08/19/15 186 lb 1.1 oz (84.4 kg)    Filed Weights   08/18/15 0830 08/18/15 1240 08/19/15 0557  Weight: 179 lb 0.2 oz (81.2 kg) 180 lb 1.9 oz (81.7 kg) 186 lb 1.1 oz (84.4 kg)    BMI:  Body mass index is 27.46 kg/(m^2).  Estimated Nutritional Needs:   Kcal:  9604-5409 kcals (BEE 1434, 1.2 AF, 1.2-1.4 IF)   Protein:  113-150 g/kg (1.5-2.0 g/kg) but may be closer to 150-188 g (2.0-2.5 g/kg)   Fluid:  1000 mL plus UOP  HIGH Care Level  Romelle Starcher MS, RD, LDN [phone] Pager

## 2015-08-19 NOTE — Progress Notes (Signed)
Pharmacy Consult for Daptomycin/Meropenem Indication: Sepsis/Sacral decubitus  Allergies  Allergen Reactions  . Contrast Media [Iodinated Diagnostic Agents] Anaphylaxis  . Ampicillin Rash    Patient Measurements: Height:  (175.3 cm) Weight: 186 lb 1.1 oz (84.4 kg) IBW/kg (Calculated) : 66.2   Vital Signs: Temp: 97.1 F (36.2 C) (09/30 0800) Temp Source: Axillary (09/30 0800) BP: 135/60 mmHg (09/30 1030) Pulse Rate: 112 (09/30 1030) Intake/Output from previous day: 09/29 0701 - 09/30 0700 In: 2245 [I.V.:400; NG/GT:1680; IV Piggyback:165] Out: 200 [Stool:700] Intake/Output from this shift: Total I/O In: 330 [I.V.:30; NG/GT:300] Out: -   Labs:  Recent Labs  08/17/15 1350 08/18/15 0507 08/18/15 0939 08/19/15 0548  WBC  --  7.5 7.2 9.1  HGB  --  8.4* 8.1* 8.4*  PLT  --  20* 20* 34*  CREATININE 1.10* 1.24* 1.30*  --    Estimated Creatinine Clearance: 60.1 mL/min (by C-G formula based on Cr of 1.3). No results for input(s): VANCOTROUGH, VANCOPEAK, VANCORANDOM, GENTTROUGH, GENTPEAK, GENTRANDOM, TOBRATROUGH, TOBRAPEAK, TOBRARND, AMIKACINPEAK, AMIKACINTROU, AMIKACIN in the last 72 hours.   Microbiology: Recent Results (from the past 720 hour(s))  Culture, blood (routine x 2)     Status: None   Collection Time: 08/01/15  7:00 PM  Result Value Ref Range Status   Specimen Description BLOOD RIGHT ANTECUBITAL  Final   Special Requests BOTTLES DRAWN AEROBIC ONLY 5CC  Final   Culture NO GROWTH 5 DAYS  Final   Report Status 08/06/2015 FINAL  Final  Culture, blood (routine x 2)     Status: None   Collection Time: 08/01/15  7:10 PM  Result Value Ref Range Status   Specimen Description BLOOD RIGHT HAND  Final   Special Requests IN PEDIATRIC BOTTLE 3CC  Final   Culture  Setup Time   Final    GRAM POSITIVE COCCI IN CLUSTERS AEROBIC BOTTLE ONLY CRITICAL RESULT CALLED TO, READ BACK BY AND VERIFIED WITH: D TAYLOR,RN AT 1115 08/02/15 BY L BENFIELD    Culture STAPHYLOCOCCUS  SPECIES (COAGULASE NEGATIVE)  Final   Report Status 08/06/2015 FINAL  Final   Organism ID, Bacteria STAPHYLOCOCCUS SPECIES (COAGULASE NEGATIVE)  Final      Susceptibility   Staphylococcus species (coagulase negative) - MIC*    CIPROFLOXACIN >=8 RESISTANT Resistant     ERYTHROMYCIN <=0.25 SENSITIVE Sensitive     GENTAMICIN 8 INTERMEDIATE Intermediate     OXACILLIN >=4 RESISTANT Resistant     TETRACYCLINE <=1 SENSITIVE Sensitive     VANCOMYCIN 1 SENSITIVE Sensitive     TRIMETH/SULFA 160 RESISTANT Resistant     CLINDAMYCIN <=0.25 SENSITIVE Sensitive     RIFAMPIN <=0.5 SENSITIVE Sensitive     Inducible Clindamycin NEGATIVE Sensitive     * STAPHYLOCOCCUS SPECIES (COAGULASE NEGATIVE)  Culture, blood (routine x 2)     Status: None   Collection Time: 08/02/15  7:00 PM  Result Value Ref Range Status   Specimen Description BLOOD RIGHT ANTECUBITAL  Final   Special Requests BOTTLES DRAWN AEROBIC ONLY 10CC  Final   Culture  Setup Time   Final    GRAM POSITIVE COCCI IN CLUSTERS AEROBIC BOTTLE ONLY CRITICAL RESULT CALLED TO, READ BACK BY AND VERIFIED WITH: DR Gwenevere Abbot RN 1549 08/03/15 A BROWNING    Culture   Final    STAPHYLOCOCCUS SPECIES (COAGULASE NEGATIVE) SUSCEPTIBILITIES PERFORMED ON PREVIOUS CULTURE WITHIN THE LAST 5 DAYS.    Report Status 08/06/2015 FINAL  Final  Culture, blood (routine x 2)     Status: None  Collection Time: 08/02/15  7:05 PM  Result Value Ref Range Status   Specimen Description BLOOD RIGHT HAND  Final   Special Requests BOTTLES DRAWN AEROBIC AND ANAEROBIC 10CC  Final   Culture NO GROWTH 5 DAYS  Final   Report Status 08/07/2015 FINAL  Final  Blood Culture (routine x 2)     Status: None   Collection Time: 08/04/2015  8:51 AM  Result Value Ref Range Status   Specimen Description BLOOD UNKO  Final   Special Requests BOTTLES DRAWN AEROBIC AND ANAEROBIC  3CC  Final   Culture NO GROWTH 5 DAYS  Final   Report Status 08/17/2015 FINAL  Final  Blood Culture (routine x 2)      Status: None   Collection Time: 07/26/2015  9:20 AM  Result Value Ref Range Status   Specimen Description BLOOD LEFT ARM  Final   Special Requests BOTTLES DRAWN AEROBIC AND ANAEROBIC  1CC  Final   Culture NO GROWTH 5 DAYS  Final   Report Status 08/17/2015 FINAL  Final  Wound culture     Status: None   Collection Time: 08/15/2015  9:20 AM  Result Value Ref Range Status   Specimen Description DECUBITIS  Final   Special Requests Normal  Final   Gram Stain   Final    FEW WBC SEEN MANY GRAM NEGATIVE RODS RARE GRAM POSITIVE COCCI    Culture   Final    HEAVY GROWTH ESCHERICHIA COLI MODERATE GROWTH ENTEROBACTER AEROGENES PROTEUS MIRABILIS HEAVY GROWTH ENTEROCOCCUS SPECIES VRE HAVE INTRINSIC RESISTANCE TO MOST COMMONLY USED ANTIBIOTICS AND THE ABILITY TO ACQUIRE RESISTANCE TO MOST AVAILABLE ANTIBIOTICS.    Report Status 08/16/2015 FINAL  Final   Organism ID, Bacteria ESCHERICHIA COLI  Final   Organism ID, Bacteria ENTEROBACTER AEROGENES  Final   Organism ID, Bacteria PROTEUS MIRABILIS  Final   Organism ID, Bacteria ENTEROCOCCUS SPECIES  Final      Susceptibility   Enterobacter aerogenes - MIC*    CEFTAZIDIME <=1 SENSITIVE Sensitive     CEFAZOLIN >=64 RESISTANT Resistant     CEFTRIAXONE <=1 SENSITIVE Sensitive     CIPROFLOXACIN <=0.25 SENSITIVE Sensitive     GENTAMICIN <=1 SENSITIVE Sensitive     IMIPENEM 1 SENSITIVE Sensitive     TRIMETH/SULFA <=20 SENSITIVE Sensitive     * MODERATE GROWTH ENTEROBACTER AEROGENES   Escherichia coli - MIC*    AMPICILLIN <=2 SENSITIVE Sensitive     CEFTAZIDIME <=1 SENSITIVE Sensitive     CEFAZOLIN <=4 SENSITIVE Sensitive     CEFTRIAXONE <=1 SENSITIVE Sensitive     CIPROFLOXACIN <=0.25 SENSITIVE Sensitive     GENTAMICIN <=1 SENSITIVE Sensitive     IMIPENEM <=0.25 SENSITIVE Sensitive     TRIMETH/SULFA <=20 SENSITIVE Sensitive     * HEAVY GROWTH ESCHERICHIA COLI   Proteus mirabilis - MIC*    AMPICILLIN >=32 RESISTANT Resistant     CEFTAZIDIME <=1  SENSITIVE Sensitive     CEFAZOLIN 8 SENSITIVE Sensitive     CEFTRIAXONE <=1 SENSITIVE Sensitive     CIPROFLOXACIN <=0.25 SENSITIVE Sensitive     GENTAMICIN <=1 SENSITIVE Sensitive     IMIPENEM 1 SENSITIVE Sensitive     TRIMETH/SULFA <=20 SENSITIVE Sensitive     * PROTEUS MIRABILIS   Enterococcus species - MIC*    AMPICILLIN >=32 RESISTANT Resistant     VANCOMYCIN >=32 RESISTANT Resistant     GENTAMICIN SYNERGY SENSITIVE Sensitive     TETRACYCLINE Value in next row Resistant  RESISTANT>=16    * HEAVY GROWTH ENTEROCOCCUS SPECIES  MRSA PCR Screening     Status: None   Collection Time: 08/11/2015  2:38 PM  Result Value Ref Range Status   MRSA by PCR NEGATIVE NEGATIVE Final    Comment:        The GeneXpert MRSA Assay (FDA approved for NASAL specimens only), is one component of a comprehensive MRSA colonization surveillance program. It is not intended to diagnose MRSA infection nor to guide or monitor treatment for MRSA infections.   Blood culture (single)     Status: None   Collection Time: 08/11/2015  3:36 PM  Result Value Ref Range Status   Specimen Description BLOOD RIGHT ASSIST CONTROL  Final   Special Requests BOTTLES DRAWN AEROBIC AND ANAEROBIC  Final   Culture NO GROWTH 5 DAYS  Final   Report Status 08/17/2015 FINAL  Final  Wound culture     Status: None   Collection Time: 08/13/15 12:37 PM  Result Value Ref Range Status   Specimen Description WOUND  Final   Special Requests Normal  Final   Gram Stain   Final    FEW WBC SEEN TOO NUMEROUS TO COUNT GRAM NEGATIVE RODS FEW GRAM POSITIVE COCCI    Culture   Final    HEAVY GROWTH ESCHERICHIA COLI MODERATE GROWTH PROTEUS MIRABILIS LIGHT GROWTH KLEBSIELLA PNEUMONIAE MODERATE GROWTH ENTEROCOCCUS GALLINARUM CRITICAL RESULT CALLED TO, READ BACK BY AND VERIFIED WITH: [facility] BORBA AT 1042 08/16/15 DV    Report Status 08/17/2015 FINAL  Final   Organism ID, Bacteria ESCHERICHIA COLI  Final   Organism ID, Bacteria PROTEUS  MIRABILIS  Final   Organism ID, Bacteria KLEBSIELLA PNEUMONIAE  Final   Organism ID, Bacteria ENTEROCOCCUS GALLINARUM  Final      Susceptibility   Escherichia coli - MIC*    AMPICILLIN >=32 RESISTANT Resistant     CEFTAZIDIME 4 RESISTANT Resistant     CEFAZOLIN >=64 RESISTANT Resistant     CEFTRIAXONE 16 RESISTANT Resistant     GENTAMICIN 2 SENSITIVE Sensitive     IMIPENEM >=16 RESISTANT Resistant     TRIMETH/SULFA <=20 SENSITIVE Sensitive     Extended ESBL POSITIVE Resistant     PIP/TAZO Value in next row Resistant      RESISTANT>=128    CIPROFLOXACIN Value in next row Sensitive      SENSITIVE<=0.25    * HEAVY GROWTH ESCHERICHIA COLI   Klebsiella pneumoniae - MIC*    AMPICILLIN Value in next row Resistant      SENSITIVE<=0.25    CEFTAZIDIME Value in next row Resistant      SENSITIVE<=0.25    CEFAZOLIN Value in next row Resistant      SENSITIVE<=0.25    CEFTRIAXONE Value in next row Resistant      SENSITIVE<=0.25    CIPROFLOXACIN Value in next row Resistant      SENSITIVE<=0.25    GENTAMICIN Value in next row Sensitive      SENSITIVE<=0.25    IMIPENEM Value in next row Resistant      SENSITIVE<=0.25    TRIMETH/SULFA Value in next row Resistant      SENSITIVE<=0.25    PIP/TAZO Value in next row Resistant      RESISTANT>=128    * LIGHT GROWTH KLEBSIELLA PNEUMONIAE   Proteus mirabilis - MIC*    AMPICILLIN Value in next row Resistant      RESISTANT>=128    CEFTAZIDIME Value in next row Sensitive      RESISTANT>=128  CEFAZOLIN Value in next row Sensitive      RESISTANT>=128    CEFTRIAXONE Value in next row Sensitive      RESISTANT>=128    CIPROFLOXACIN Value in next row Sensitive      RESISTANT>=128    GENTAMICIN Value in next row Sensitive      RESISTANT>=128    IMIPENEM Value in next row Sensitive      RESISTANT>=128    TRIMETH/SULFA Value in next row Sensitive      RESISTANT>=128    PIP/TAZO Value in next row Sensitive      SENSITIVE<=4    * MODERATE GROWTH  PROTEUS MIRABILIS   Enterococcus gallinarum - MIC*    AMPICILLIN Value in next row Resistant      SENSITIVE<=4    GENTAMICIN SYNERGY Value in next row Sensitive      SENSITIVE<=4    CIPROFLOXACIN Value in next row Resistant      RESISTANT>=8    TETRACYCLINE Value in next row Resistant      RESISTANT>=16    * MODERATE GROWTH ENTEROCOCCUS GALLINARUM  Wound culture     Status: None   Collection Time: 08/15/15  2:53 PM  Result Value Ref Range Status   Specimen Description WOUND  Final   Special Requests NONE  Final   Gram Stain FEW WBC SEEN NO ORGANISMS SEEN   Final   Culture NO GROWTH 3 DAYS  Final   Report Status 08/18/2015 FINAL  Final  C difficile quick scan w PCR reflex     Status: None   Collection Time: 08/17/15 11:34 AM  Result Value Ref Range Status   C Diff antigen NEGATIVE NEGATIVE Final   C Diff toxin NEGATIVE NEGATIVE Final   C Diff interpretation Negative for C. difficile  Final    Medical History: Past Medical History  Diagnosis Date  . Obesity   . Dyslipidemia   . Hypertension   . Coronary artery disease     s/p BMS 2010 LAD  . Dysrhythmia     ventricular tachycardia resolved after LAD stent and beta blocker  . Diabetes mellitus     with retinopathy, neuropathy and microalbuminemia  . ESRD (end stage renal disease) on dialysis     Medications:  Scheduled:  . sodium chloride   Intravenous Once  . sodium chloride   Intravenous Once  . acidophilus  1 capsule Oral Daily  . DAPTOmycin (CUBICIN)  IV  500 mg Intravenous Q48H  . epoetin (EPOGEN/PROCRIT) injection  4,000 Units Intravenous Q T,Th,Sa-HD  . feeding supplement (PRO-STAT SUGAR FREE 64)  30 mL Per Tube 6 times per day  . free water  100 mL Per Tube 3 times per day  . hydrocortisone  10 mg Oral BID  . insulin aspart  0-15 Units Subcutaneous 4 times per day  . meropenem (MERREM) IV  500 mg Intravenous Q24H  . midodrine  10 mg Oral TID WC  . nystatin   Topical TID  . sodium hypochlorite    Irrigation BID   Infusions:  . 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 Patient is on day 8 of antibiotics (day 4 of daptomycin and day 8 of meropenem).  Goal of Therapy:  Resolution of infection  Plan:  Continue daptomycin 6 mg/kg q 48 hours and meropenem 500 mg iv q 24 hours.   Luisa Hart D 08/19/2015,11:23 AM

## 2015-08-19 NOTE — Progress Notes (Signed)
Patient seen and examined at bedside in ICU.  Sacral decubitus is examined.  Devitalized tissue is present and there is leading from the granulation tissue around it. This bleeding is spontaneous there is no purulence and no erythema.  Assessment and plan the wound is granulating fairly well but there is devitalized tissue. The concern is that aggressive debridement with a platelet count and low single digits would result in brisk bleeding and be counterproductive. There is no sign of infection at this time.  I believe that the only hope for this patient's sacral decubitus healing is that with a flap there could be closure and long-term healing. This cannot be done in [facility] as are no plastic surgeons on staff that could perform this procedure. Would support transfer to a tertiary care center and [facility] has had the most experience with her and her wounds.  Discussed with her husband and with the nursing staff.

## 2015-08-19 NOTE — Care Management Note (Addendum)
Case Management Note  Patient Details  Name: Courtney [name] MRN: [id number] Date of Birth: Apr 04, 1966  Subjective/Objective:  CSW updated on discharge plan. Per Dr. Amado Coe,  Plan is transfer to [facility] when bed available.                 Action/Plan:   Expected Discharge Date:     08/19/2015             Expected Discharge Plan:    In-House Referral:     Discharge planning Services  CM Consult  Post Acute Care Choice:   [facility] Choice offered to:     DME Arranged:    DME Agency:     HH Arranged:    HH Agency:     Status of Service:  In process, will continue to follow  Medicare Important Message Given:    Date Medicare IM Given:    Medicare IM give by:    Date Additional Medicare IM Given:    Additional Medicare Important Message give by:     If discussed at Long Length of Stay Meetings, dates discussed:    Additional Comments:  [name], RN 08/19/2015, 11:24 AM

## 2015-08-19 NOTE — Progress Notes (Signed)
ELECTROLYTE MANAGEMENT - INITIAL  Pharmacy Consult for Electrolyte Management Indication: hypokalemia  Allergies  Allergen Reactions  . Contrast Media [Iodinated Diagnostic Agents] Anaphylaxis  . Ampicillin Rash    Patient Measurements: Height:  (175.3 cm) Weight: 186 lb 1.1 oz (84.4 kg) IBW/kg (Calculated) : 66.2   Vital Signs: Temp: 97.1 F (36.2 C) (09/30 0800) Temp Source: Axillary (09/30 0800) BP: 135/60 mmHg (09/30 1030) Pulse Rate: 112 (09/30 1030) Intake/Output from previous day: 09/29 0701 - 09/30 0700 In: 2245 [I.V.:400; NG/GT:1680; IV Piggyback:165] Out: 200 [Stool:700] Intake/Output from this shift: Total I/O In: 330 [I.V.:30; NG/GT:300] Out: -   Labs:  Recent Labs  08/16/15 1323  08/18/15 0507 08/18/15 0939 08/19/15 0548  WBC  --   < > 7.5 7.2 9.1  HGB  --   < > 8.4* 8.1* 8.4*  HCT  --   < > 26.0* 25.5* 26.3*  PLT  --   < > 20* 20* 34*  APTT 82*  --   --   --   --   INR 1.48  --   --   --   --   < > = values in this interval not displayed.   Recent Labs  08/17/15 0446  08/18/15 0507 08/18/15 0939 08/19/15 1137  NA 141  < > 140 136 141  K 3.0*  < > 4.0 3.8 3.8  CL 105  < > 104 102 105  CO2 31  < > 32 31 33*  GLUCOSE 148*  < > 136* 132* 162*  BUN 18  < > 31* 31* 28*  CREATININE 0.94  < > 1.24* 1.30* 1.03*  CALCIUM 7.2*  < > 7.6* 7.3* 7.7*  MG 1.7  --   --   --   --   PHOS  --   --   --  2.0*  --   PROT  --   --  4.4*  --   --   ALBUMIN  --   --  1.7* 1.7*  --   AST  --   --  23  --   --   ALT  --   --  22  --   --   ALKPHOS  --   --  384*  --   --   BILITOT  --   --  1.0  --   --   < > = values in this interval not displayed. Estimated Creatinine Clearance: 75.8 mL/min (by C-G formula based on Cr of 1.03).    Recent Labs  08/19/15 0554 08/19/15 0729 08/19/15 1133  GLUCAP 202* 185* 148*    Medical History: Past Medical History  Diagnosis Date  . Obesity   . Dyslipidemia   . Hypertension   . Coronary artery disease      s/p BMS 2010 LAD  . Dysrhythmia     ventricular tachycardia resolved after LAD stent and beta blocker  . Diabetes mellitus     with retinopathy, neuropathy and microalbuminemia  . ESRD (end stage renal disease) on dialysis     Medications:  Scheduled:  . sodium chloride   Intravenous Once  . sodium chloride   Intravenous Once  . acidophilus  1 capsule Oral Daily  . DAPTOmycin (CUBICIN)  IV  500 mg Intravenous Q48H  . epoetin (EPOGEN/PROCRIT) injection  4,000 Units Intravenous Q T,Th,Sa-HD  . feeding supplement (PRO-STAT SUGAR FREE 64)  30 mL Per Tube 6 times per day  . free water  100 mL Per Tube 3 times per day  . hydrocortisone  10 mg Oral BID  . insulin aspart  0-15 Units Subcutaneous 4 times per day  . meropenem (MERREM) IV  500 mg Intravenous Q24H  . midodrine  10 mg Oral TID WC  . nystatin   Topical TID  . sodium hypochlorite   Irrigation BID    Assessment: 49 yo female admitted for sepsis, found to have hypokalemia. Pharmacy consulted for electrolyte management.  Plan:  K is wnl so no need for further replacement. Ca corrects to  9.5. Will f/u AM labs.    Luisa Hart D, Pharm.D. Clinical Pharmacist 08/19/2015,12:20 PM

## 2015-08-19 NOTE — Progress Notes (Signed)
   08/19/15 1104  Clinical Encounter Type  Visited With Patient  Visit Type Spiritual support  Consult/Referral To Chaplain  Spiritual Encounters  Spiritual Needs Emotional  Stress Factors  Patient Stress Factors Other (Comment)  Chaplain rounded in unit and offered a compassionate presence. Chaplain Sonya A. Laws Ext. [phone]

## 2015-08-19 NOTE — Progress Notes (Addendum)
   08/19/15 1500  Clinical Encounter Type  Visited With Patient and family together  Visit Type Follow-up  Referral From Nurse  Consult/Referral To Chaplain  Spiritual Encounters  Spiritual Needs Other (Comment)  Stress Factors  Family Stress Factors Health changes;Major life changes  Chaplain Thomas and I rounded in the unit. Offered a compassionate presence to staff and patient's spouse. Chaplain Maisie Fus spoke with patient's spouse and offered a listening ear and emotional and spiritual support. I was also able to engage patient and husband. Patient seemed more responsive than our earlier visit. Chaplain Sonya A. Laws Ext. [phone]

## 2015-08-19 NOTE — Clinical Social Work Note (Signed)
RN CM stated to CSW that patient was to discharge today to [facility]. CSW reviewed patient's medical record and the surgeon had come through and stated patient may need to transfer to a tertiary facility. Currently it is undecided what the plan is going to be.  York Spaniel MSW,LCSW [phone]

## 2015-08-20 LAB — BASIC METABOLIC PANEL
ANION GAP: 4 — AB (ref 5–15)
BUN: 21 mg/dL — AB (ref 6–20)
CALCIUM: 7.4 mg/dL — AB (ref 8.9–10.3)
CO2: 33 mmol/L — ABNORMAL HIGH (ref 22–32)
Chloride: 104 mmol/L (ref 101–111)
Creatinine, Ser: 0.72 mg/dL (ref 0.44–1.00)
GFR calc Af Amer: >60 mL/min (ref >60)
GLUCOSE: 101 mg/dL — AB (ref 65–99)
POTASSIUM: 3.8 mmol/L (ref 3.5–5.1)
SODIUM: 141 mmol/L (ref 135–145)

## 2015-08-20 LAB — GLUCOSE, CAPILLARY
GLUCOSE-CAPILLARY: 114 mg/dL — AB (ref 65–99)
GLUCOSE-CAPILLARY: 114 mg/dL — AB (ref 65–99)
GLUCOSE-CAPILLARY: 123 mg/dL — AB (ref 65–99)
GLUCOSE-CAPILLARY: 127 mg/dL — AB (ref 65–99)
Glucose-Capillary: 97 mg/dL (ref 65–99)
Glucose-Capillary: 120 mg/dL — ABNORMAL HIGH (ref 65–99)

## 2015-08-20 LAB — RENAL FUNCTION PANEL
Albumin: 1.9 g/dL — ABNORMAL LOW (ref 3.5–5.0)
Anion gap: 5 (ref 5–15)
BUN: 27 mg/dL — AB (ref 6–20)
CALCIUM: 8 mg/dL — AB (ref 8.9–10.3)
CHLORIDE: 104 mmol/L (ref 101–111)
CO2: 32 mmol/L (ref 22–32)
CREATININE: 0.77 mg/dL (ref 0.44–1.00)
GFR calc Af Amer: >60 mL/min (ref >60)
GFR calc non Af Amer: >60 mL/min (ref >60)
GLUCOSE: 132 mg/dL — AB (ref 65–99)
Phosphorus: 1.7 mg/dL — ABNORMAL LOW (ref 2.5–4.6)
Potassium: 3.8 mmol/L (ref 3.5–5.1)
SODIUM: 141 mmol/L (ref 135–145)

## 2015-08-20 LAB — MAGNESIUM: Magnesium: 1.7 mg/dL (ref 1.7–2.4)

## 2015-08-20 MED ORDER — CETYLPYRIDINIUM CHLORIDE 0.05 % MT LIQD
7.0000 mL | Freq: Two times a day (BID) | OROMUCOSAL | Status: DC
  Administered 2015-08-21 – 2015-10-11 (×100): 7 mL via OROMUCOSAL

## 2015-08-20 MED ORDER — CHLORHEXIDINE GLUCONATE 0.12 % MT SOLN
15.0000 mL | Freq: Two times a day (BID) | OROMUCOSAL | Status: DC
  Administered 2015-08-20 – 2015-10-11 (×103): 15 mL via OROMUCOSAL
  Filled 2015-08-20 (×71): qty 15

## 2015-08-20 MED ORDER — ALBUMIN HUMAN 25 % IV SOLN
12.5000 g | Freq: Two times a day (BID) | INTRAVENOUS | Status: AC
  Administered 2015-08-20 – 2015-08-22 (×6): 12.5 g via INTRAVENOUS
  Filled 2015-08-20 (×8): qty 50

## 2015-08-20 MED ORDER — EPOETIN ALFA 20000 UNIT/ML IJ SOLN
20000.0000 [IU] | INTRAMUSCULAR | Status: DC
  Filled 2015-08-20: qty 1

## 2015-08-20 MED ORDER — SODIUM CHLORIDE 0.9 % IV SOLN
500.0000 mg | INTRAVENOUS | Status: DC
  Administered 2015-08-20 – 2015-08-22 (×2): 500 mg via INTRAVENOUS
  Filled 2015-08-20 (×3): qty 10

## 2015-08-20 MED ORDER — EPOETIN ALFA 20000 UNIT/ML IJ SOLN
20000.0000 [IU] | INTRAMUSCULAR | Status: DC
  Administered 2015-08-20: 20000 [IU] via SUBCUTANEOUS
  Filled 2015-08-20 (×2): qty 1

## 2015-08-20 MED ORDER — SODIUM CHLORIDE 0.9 % IV SOLN
500.0000 mg | INTRAVENOUS | Status: DC
  Filled 2015-08-20: qty 0.5

## 2015-08-20 MED ORDER — PUREFLOW DIALYSIS SOLUTION
INTRAVENOUS | Status: DC
  Administered 2015-08-20: 3 via INTRAVENOUS_CENTRAL
  Administered 2015-08-20 – 2015-08-22 (×3): via INTRAVENOUS_CENTRAL

## 2015-08-20 MED ORDER — MEROPENEM 1 G IV SOLR
1.0000 g | Freq: Two times a day (BID) | INTRAVENOUS | Status: DC
  Administered 2015-08-20 – 2015-08-25 (×12): 1 g via INTRAVENOUS
  Filled 2015-08-20 (×14): qty 1

## 2015-08-20 NOTE — Progress Notes (Signed)
[facility]   PATIENT NAME: Courtney [name]    MR#:  [id number]  DATE OF BIRTH:  22-Aug-1966  SUBJECTIVE:  Patient is encephalopathic , sometimes opens her eyes to verbal commands but not communicating.  pt on tube feeds - Vital , flexiseal  Husband at bedside  REVIEW OF SYSTEMS:  Unable to obtain review of systems DRUG ALLERGIES:   Allergies  Allergen Reactions  . Contrast Media [Iodinated Diagnostic Agents] Anaphylaxis  . Ampicillin Rash    VITALS:  Blood pressure 126/40, pulse 92, temperature 98.6 F (37 C), temperature source Oral, resp. rate 19, height  (1.753 m), weight 85.1 kg (187 lb 9.8 oz), SpO2 100 %.  PHYSICAL EXAMINATION:  GENERAL:  49 y.o.-year-old patient lying in the bed with no acute distress. Chronically ill EYES: Pupils equal, round, sluggishly reactive to light and accommodation No scleral icterus. HEENT:  Normocephalic. Oropharynx and nasopharynx clear. Trach site is intact NECK:  Supple, no jugular venous distention. No thyroid enlargement, no tenderness.  LUNGS: Moderate air entry, decreased breath sounds at the bases, no wheezing, rales,rhonchi or crepitation. No use of accessory muscles of respiration.  CARDIOVASCULAR: S1, S2 normal. No murmurs, rubs, or gallops.  ABDOMEN: Soft, nondistended. Bowel sounds present.  EXTREMITIES: peripheral  edema, no cyanosis, or clubbing.  NEUROLOGIC: Patient is with altered mental status  PSYCHIATRIC: The patient is disoriented SKIN: Large sacral decubitus ulcer . Petechiae on extremities. Peripheral edema is present   LABORATORY PANEL:   CBC  Recent Labs Lab 08/19/15 0548  WBC 9.1  HGB 8.4*  HCT 26.3*  PLT 34*   ------------------------------------------------------------------------------------------------------------------  Chemistries   Recent Labs Lab 08/17/15 0446  08/18/15 0507  08/20/15 0422  NA 141  < > 140  < > 141  K 3.0*  < >  4.0  < > 3.8  CL 105  < > 104  < > 104  CO2 31  < > 32  < > 33*  GLUCOSE 148*  < > 136*  < > 101*  BUN 18  < > 31*  < > 21*  CREATININE 0.94  < > 1.24*  < > 0.72  CALCIUM 7.2*  < > 7.6*  < > 7.4*  MG 1.7  --   --   --   --   AST  --   --  23  --   --   ALT  --   --  22  --   --   ALKPHOS  --   --  384*  --   --   BILITOT  --   --  1.0  --   --   < > = values in this interval not displayed. ------------------------------------------------------------------------------------------------------------------  Cardiac Enzymes No results for input(s): TROPONINI in the last 168 hours. ------------------------------------------------------------------------------------------------------------------  RADIOLOGY:  Dg Chest Port 1 View  08/18/2015   CLINICAL DATA:  Acute pulmonary edema  EXAM: PORTABLE CHEST 1 VIEW  COMPARISON:  08/13/2015  FINDINGS: Tracheostomy tube is appropriately positioned. Feeding tube tip terminates over the expected location of the second portion of the duodenum. Right IJ approach hemodialysis catheter terminates over the right atrium. Moderate enlargement of the cardiomediastinal silhouette is reidentified with curvilinear right lower lobe presumed atelectasis. Trace pleural fluid.  IMPRESSION: Cardiomegaly without focal acute finding.   Electronically Signed   By: Christiana Pellant M.D.   On: 08/18/2015 18:52    EKG:   Orders placed or performed in  visit on 08/19/2015  . EKG 12-Lead  . EKG 12-Lead  . EKG 12-Lead    ASSESSMENT AND PLAN:   1. Sepsis . Probably from stage IV foul-smelling sacral decubiti with multiple drug resistant organisms Patient is started on Daptomycin and  continue meropenem - blood Culture with no growth so far -Wound culture with Escherichia coli, Proteus mirabilis, Klebsiella pneumonia and enterococcus gallinarum -off  levophed as blood pressure is better. Hold all antihypertensives medication -Appreciate Critical care/ID recommendations.    2. Acute encephalopathy- likely from problem #1 Continue current antibiotics daptomycin and meropenem Initial CT scan was negative.  Patient has no history of seizure. No witnessed seizures Status post EEG  Appreciate neurology recommendations  3. symptomatic anemia-   status post1 unit of blood transfusion and hemoglobin is at 8.4 today.  4. Thrombocytopenia- no acute bleeding. Platelet count is at 34,000 today.Will hold off on heparin subcutaneous  Transfuse platelets if platelet count is less than or equal to 10,000 HIT/DIC panel and results are pending Oncology recommendations are appreciated Nephrology stopped  using heparin during dialysis  5. End-stage renal disease on hemodialysis- patient is getting hemodialysis today   6. Elevated troponin likely demand ischemia with septic shock  7. Stage IV sacral decubitus- status post bedside wound debridement. Appreciate surgery recommendations. Dr Excell Seltzer is recommending plastic surgery eval and given pt-s chronic co-morbidities the sacral ulcers has less chances to heal  8. Hypokalemia and hypomagnesemia- replace potassium and magnesium IV.   9. Severe protein energy malnutrition - tube feeds with vital decreased to 30 mL per hour as per husband's request  10. Diarrhea-can be antibiotic induced as well as from tube feeds-flexi seal. Start probiotic   All the records are reviewed and case discussed with Care Management/Social Workerr. I have discussed the plan of care at length with patient's husband  Mr. [name], he would like to make a decision on transferring the patient to tertiary care center , [facility] on Sunday. Not considering transfer at this time today. He is aware that patient is severely malnourished, has very low platelet count is at high risk for bleeding and plastic surgery services are unavailable at [facility].  CODE STATUS: Full code  TOTAL CRITICAL CARE TIME TAKING CARE OF THIS  PATIENT: 45 minutes.  Greater than 50% time was spent on face-to-face counseling, discussing with specialists and coordination of care     Skylor Hughson M.D on 08/20/2015 at 11:40 AM  Between 7am to 6pm - Pager - [phone] After 6pm go to [email or web address] - password [email or web address]  [facility]  Office  [phone]  CC: Primary care physician; [name], MD

## 2015-08-20 NOTE — Progress Notes (Signed)
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 HD 9/27  GASTROINTESTINAL A:  Loose stool - back on TF  Hx of C. Diff - PCR neg P:   SUP: N/I unless requires vent support Begin D III diet with supervision 9/25 Check c.diff PCR>>neg TF goal rate reduced to 35, due to have moderate secretions and concerns for aspiration   HEMATOLOGIC A:   Acute on chronic anemia - unclear if acute blood loss Thrombocytopenia, worsening. Etiology unclear. Was on heparin PTA P:  DVT px: SCDs Monitor CBC intermittently Transfuse per usual guidelines Check HIT panel 9/25 - pending Might need platelet transfusion prior to next debridement  INFECTIOUS A:   Severe sepsis Sacral decubitus ulcer with abscess P:   Monitor temp, WBC count Micro and abx as above Surgery following ID recs appreciated - multiple MDR organisms, but improving slowly with current antibiotic selection, we require extensive wound care.   ENDOCRINE A:  DM 2, controlled Chronic prednisone therapy - chronic dose 10 mg BID Secondary adrenal insuff P:   CBGs and SSI - mod scale Stress dose steroids - wean as able based on BP  NEUROLOGIC A:   Acute encephalopathy, resolving-somnolent still P:   RASS goal: 0 Minimize sedating meds - baseline mentation is talking and following commands.  -  husband concerned about patient mentation not improving, concerned about sacral ulcer organisms; CT head negative.   ADMISSION DATE:  08/14/2015 CONSULTATION DATE: 9/23  INITIAL PRESENTATION:  32 F who has been in medical facilities (hosp, LTAC, rehab) for 2 yrs following gastric bypass surgery with multiple complications. Now with chronic trach, ESRD, profound debilitation, severe sacral pressure ulcer. Was seemingly making progress and transferred to rehab facility approx one week prior to this admission. She was sent to [facility] ED with AMS and hypotension. Working dx of severe sepsis/septic shock due to infected sacral pressure ulcer. Also has R side externalized HD cath and tunneled L IJ CVL. She has history of resistant bacterial infections. She is on chronic steroids. Her husband indicates that she has been diagnosed with adrenal insuff at some point during the past 2 yrs  MAJOR EVENTS/TEST RESULTS: 9/23 CT head: NAD 9/23 EEG: no epileptiform activity 9/23 PRBCs for Hgb 6.4 9/24 bedside debridement of sacral wound. Abscess drained 9/25 Off vasopressors. More alert. No distress. Worsening thrombocytopenia. Vanc DC'd  INDWELLING DEVICES:: Trach (chronic)  Tunneled R IJ HD cath (chronic) Tunneled L IJ CVL (chronic) L femoral A-line 9/23 >> 9/25  MICRO DATA: MRSA PCR 9/23 >> NEG Urine 9/23 >>  Wound (swab) 9/23 >> many GNR, rare GPC >>  Blood 9/23 >> neg  Wound (debridement) 9/24 >> heavy GNR >> CRE, Enterococcus, K. Pneumoniae, P. Mirabilis, VRE CDiff 9/27>>neg   ANTIMICROBIALS:  Aztreonam 9/23 >> 9/24 Vanc 9/23 >> 9/25 Vanc 9/26>>9/27 Meropenem 9/23 >>  Daptomycin 9/27>>   SUBJECTIVE:  RASS, -1.  Nothing purposeful at this time. On Trach collar at 28%, having moderate secretions today.  Open eyes, but not tracking, husband at bedside Plt count mildly increasing   VITAL SIGNS: Temp:  [97.3 F (36.3 C)-98.6 F (37 C)] 98.6 F (37 C) (09/30 1530) Pulse Rate:  [88-113]  92 (10/01 0800) Resp:  [0-30] 19 (10/01 0800) BP: (88-143)/(39-83) 126/40 mmHg (10/01 0800) SpO2:  [98 %-100 %] 100 % (10/01 0800) FiO2 (%):  [28 %] 28 % (09/30 2325) Weight:  [85.1 kg (187 lb 9.8 oz)] 85.1 kg (187 lb 9.8 oz) (09/30 1615) HEMODYNAMICS:   VENTILATOR SETTINGS: Vent Mode:  [-]  FiO2 (%):  [28 %] 28 % INTAKE / OUTPUT:  Intake/Output Summary (Last 24 hours) at 08/20/15 0855 Last data filed at 08/19/15 1959  Gross per 24 hour  Intake 856.33 ml  Output    797 ml  Net  59.33 ml    PHYSICAL EXAMINATION: General: RASS 0, -1, + F/C, no respiratory distress Neuro: CNs intact, MAEs, DTRs symmetric HEENT: Cushingoid facies Neck: no JVD noted, trach site clean Cardiovascular: Regular, no M noted Lungs: No adventitious sounds Abdomen: Obese, soft, NT, diminished BS Ext: cool, diminished distal pulses, no edema Skin: very deep, malodorous sacral pressure ulcer  LABS:  CBC  Recent Labs Lab 08/18/15 0507 08/18/15 0939 08/19/15 0548  WBC 7.5 7.2 9.1  HGB 8.4* 8.1* 8.4*  HCT 26.0* 25.5* 26.3*  PLT 20* 20* 34*   Coag's  Recent Labs Lab 08/16/15 1323  APTT 82*  INR 1.48   BMET  Recent Labs Lab 08/18/15 0939 08/19/15 1137 08/20/15 0422  NA 136 141 141  K 3.8 3.8 3.8  CL 102 105 104  CO2 31 33* 33*  BUN 31* 28* 21*  CREATININE 1.30* 1.03* 0.72  GLUCOSE 132* 162* 101*   Electrolytes  Recent Labs Lab 08/15/15 0530 08/16/15 0422 08/17/15 0446  08/18/15 0939 08/19/15 1137 08/20/15 0422  CALCIUM 7.8* 7.6* 7.2*  < > 7.3* 7.7* 7.4*  MG  --  1.7 1.7  --   --   --   --   PHOS 1.6* 3.4  --   --  2.0*  --   --   < > = values in this interval not displayed. Sepsis Markers No results for input(s): LATICACIDVEN, PROCALCITON, O2SATVEN in the last 168 hours. ABG  Recent Labs Lab 08/15/15 0850 08/16/15 0521 08/17/15 1154  PHART 7.47* 7.46* 7.48*  PCO2ART 44 44 45  PO2ART 65* 92 111*   Liver Enzymes  Recent Labs Lab 08/18/15 0507  08/18/15 0939  AST 23  --   ALT 22  --   ALKPHOS 384*  --   BILITOT 1.0  --   ALBUMIN 1.7* 1.7*   Cardiac Enzymes No results for input(s): TROPONINI, PROBNP in the last 168 hours. Glucose  Recent Labs Lab 08/19/15 1637 08/19/15 1730 08/19/15 2150 08/20/15 0030 08/20/15 0431 08/20/15 0748  GLUCAP 135* 138* 116* 127* 97 114*     Deep Nicholos Johns, M.D.  [facility] On Call Pager - [phone] (please enter [phone])      08/20/2015, 8:55 AM

## 2015-08-20 NOTE — Progress Notes (Signed)
Subjective:  Pt still in CCU. Remains critically ill Tube feeds going @ 35.   Rectal tube in place Opens eyes to verbal commands BP was low during HD. 500 cc removed yesterday Remains off of pressors     Objective:  Vital signs in last 24 hours:  Temp:  [97.3 F (36.3 C)-98.6 F (37 C)] 98.6 F (37 C) (09/30 1530) Pulse Rate:  [88-113] 92 (10/01 0800) Resp:  [0-30] 19 (10/01 0800) BP: (88-143)/(39-83) 126/40 mmHg (10/01 0800) SpO2:  [98 %-100 %] 100 % (10/01 0800) FiO2 (%):  [28 %] 28 % (09/30 2325) Weight:  [85.1 kg (187 lb 9.8 oz)] 85.1 kg (187 lb 9.8 oz) (09/30 1615)  Weight change: 3.9 kg (8 lb 9.6 oz) Filed Weights   08/18/15 1240 08/19/15 0557 08/19/15 1615  Weight: 81.7 kg (180 lb 1.9 oz) 84.4 kg (186 lb 1.1 oz) 85.1 kg (187 lb 9.8 oz)    Intake/Output: I/O last 3 completed shifts: In: 1946.3 [I.V.:150; NG/GT:1746.3; IV Piggyback:50] Out: 797 [Other:522; Stool:275]   Intake/Output this shift:     Physical Exam: General: Chronically ill appearing  Head: Normocephalic, atraumatic. Moist oral mucosal membranes  Eyes: Eyes closed  Neck: Trach in place  Lungs:  Scattered rhonchi, normal effort  Heart: S1S2 no rubs  Abdomen:  Soft, nontender, BS present  Extremities:  ++ dependent and peripheral edema, b/l feet in soft support  Neurologic: Lethargic, opens eyes to verbal commands  Skin: No lesions  Access: R IJ permcath    Basic Metabolic Panel:  Recent Labs Lab 08/14/15 1203 08/15/15 0530 08/16/15 0422 08/17/15 0446 08/17/15 1350 08/18/15 0507 08/18/15 0939 08/19/15 1137 08/20/15 0422  NA  --  141 142 141 142 140 136 141 141  K  --  4.0 3.8 3.0* 3.8 4.0 3.8 3.8 3.8  CL  --  104 105 105 104 104 102 105 104  CO2  --  32 31 31 33* 32 31 33* 33*  GLUCOSE  --  76 234* 148* 118* 136* 132* 162* 101*  BUN  --  17 25* 18 23* 31* 31* 28* 21*  CREATININE  --  1.11* 1.45* 0.94 1.10* 1.24* 1.30* 1.03* 0.72  CALCIUM  --  7.8* 7.6* 7.2* 7.6* 7.6* 7.3* 7.7*  7.4*  MG  --   --  1.7 1.7  --   --   --   --   --   PHOS 1.6* 1.6* 3.4  --   --   --  2.0*  --   --     Liver Function Tests:  Recent Labs Lab 08/18/15 0507 08/18/15 0939  AST 23  --   ALT 22  --   ALKPHOS 384*  --   BILITOT 1.0  --   PROT 4.4*  --   ALBUMIN 1.7* 1.7*   No results for input(s): LIPASE, AMYLASE in the last 168 hours. No results for input(s): AMMONIA in the last 168 hours.  CBC:  Recent Labs Lab 08/14/15 1747  08/16/15 0422 08/17/15 0446 08/18/15 0507 08/18/15 0939 08/19/15 0548  WBC 14.8*  < > 9.4 6.7 7.5 7.2 9.1  NEUTROABS 14.0*  --   --  5.4  --   --   --   HGB 6.8*  < > 8.5* 8.3* 8.4* 8.1* 8.4*  HCT 21.4*  < > 26.3* 25.5* 26.0* 25.5* 26.3*  MCV 92.8  < > 92.5 92.6 93.0 93.0 93.9  PLT 43*  < > 15* 17* 20* 20*  34*  < > = values in this interval not displayed.  Cardiac Enzymes: No results for input(s): CKTOTAL, CKMB, CKMBINDEX, TROPONINI in the last 168 hours.  BNP: Invalid input(s): POCBNP  CBG:  Recent Labs Lab 08/19/15 1730 08/19/15 2150 08/20/15 0030 08/20/15 0431 08/20/15 0748  GLUCAP 138* 116* 127* 97 114*    Microbiology: Results for orders placed or performed during the hospital encounter of August 19, 2015  Blood Culture (routine x 2)     Status: None   Collection Time: 2015-08-19  8:51 AM  Result Value Ref Range Status   Specimen Description BLOOD [name]  Final   Special Requests BOTTLES DRAWN AEROBIC AND ANAEROBIC  3CC  Final   Culture NO GROWTH 5 DAYS  Final   Report Status 08/17/2015 FINAL  Final  Blood Culture (routine x 2)     Status: None   Collection Time: 19-Aug-2015  9:20 AM  Result Value Ref Range Status   Specimen Description BLOOD LEFT ARM  Final   Special Requests BOTTLES DRAWN AEROBIC AND ANAEROBIC  1CC  Final   Culture NO GROWTH 5 DAYS  Final   Report Status 08/17/2015 FINAL  Final  Wound culture     Status: None   Collection Time: 08/19/2015  9:20 AM  Result Value Ref Range Status   Specimen Description DECUBITIS   Final   Special Requests Normal  Final   Gram Stain   Final    FEW WBC SEEN MANY GRAM NEGATIVE RODS RARE GRAM POSITIVE COCCI    Culture   Final    HEAVY GROWTH ESCHERICHIA COLI MODERATE GROWTH ENTEROBACTER AEROGENES PROTEUS MIRABILIS HEAVY GROWTH ENTEROCOCCUS SPECIES VRE HAVE INTRINSIC RESISTANCE TO MOST COMMONLY USED ANTIBIOTICS AND THE ABILITY TO ACQUIRE RESISTANCE TO MOST AVAILABLE ANTIBIOTICS.    Report Status 08/16/2015 FINAL  Final   Organism ID, Bacteria ESCHERICHIA COLI  Final   Organism ID, Bacteria ENTEROBACTER AEROGENES  Final   Organism ID, Bacteria PROTEUS MIRABILIS  Final   Organism ID, Bacteria ENTEROCOCCUS SPECIES  Final      Susceptibility   Enterobacter aerogenes - MIC*    CEFTAZIDIME <=1 SENSITIVE Sensitive     CEFAZOLIN >=64 RESISTANT Resistant     CEFTRIAXONE <=1 SENSITIVE Sensitive     CIPROFLOXACIN <=0.25 SENSITIVE Sensitive     GENTAMICIN <=1 SENSITIVE Sensitive     IMIPENEM 1 SENSITIVE Sensitive     TRIMETH/SULFA <=20 SENSITIVE Sensitive     * MODERATE GROWTH ENTEROBACTER AEROGENES   Escherichia coli - MIC*    AMPICILLIN <=2 SENSITIVE Sensitive     CEFTAZIDIME <=1 SENSITIVE Sensitive     CEFAZOLIN <=4 SENSITIVE Sensitive     CEFTRIAXONE <=1 SENSITIVE Sensitive     CIPROFLOXACIN <=0.25 SENSITIVE Sensitive     GENTAMICIN <=1 SENSITIVE Sensitive     IMIPENEM <=0.25 SENSITIVE Sensitive     TRIMETH/SULFA <=20 SENSITIVE Sensitive     * HEAVY GROWTH ESCHERICHIA COLI   Proteus mirabilis - MIC*    AMPICILLIN >=32 RESISTANT Resistant     CEFTAZIDIME <=1 SENSITIVE Sensitive     CEFAZOLIN 8 SENSITIVE Sensitive     CEFTRIAXONE <=1 SENSITIVE Sensitive     CIPROFLOXACIN <=0.25 SENSITIVE Sensitive     GENTAMICIN <=1 SENSITIVE Sensitive     IMIPENEM 1 SENSITIVE Sensitive     TRIMETH/SULFA <=20 SENSITIVE Sensitive     * PROTEUS MIRABILIS   Enterococcus species - MIC*    AMPICILLIN >=32 RESISTANT Resistant     VANCOMYCIN >=32 RESISTANT Resistant  GENTAMICIN SYNERGY SENSITIVE Sensitive     TETRACYCLINE Value in next row Resistant      RESISTANT>=16    * HEAVY GROWTH ENTEROCOCCUS SPECIES  MRSA PCR Screening     Status: None   Collection Time: 2015/08/15  2:38 PM  Result Value Ref Range Status   MRSA by PCR NEGATIVE NEGATIVE Final    Comment:        The GeneXpert MRSA Assay (FDA approved for NASAL specimens only), is one component of a comprehensive MRSA colonization surveillance program. It is not intended to diagnose MRSA infection nor to guide or monitor treatment for MRSA infections.   Blood culture (single)     Status: None   Collection Time: August 15, 2015  3:36 PM  Result Value Ref Range Status   Specimen Description BLOOD RIGHT ASSIST CONTROL  Final   Special Requests BOTTLES DRAWN AEROBIC AND ANAEROBIC  Final   Culture NO GROWTH 5 DAYS  Final   Report Status 08/17/2015 FINAL  Final  Wound culture     Status: None   Collection Time: 08/13/15 12:37 PM  Result Value Ref Range Status   Specimen Description WOUND  Final   Special Requests Normal  Final   Gram Stain   Final    FEW WBC SEEN TOO NUMEROUS TO COUNT GRAM NEGATIVE RODS FEW GRAM POSITIVE COCCI    Culture   Final    HEAVY GROWTH ESCHERICHIA COLI MODERATE GROWTH PROTEUS MIRABILIS LIGHT GROWTH KLEBSIELLA PNEUMONIAE MODERATE GROWTH ENTEROCOCCUS GALLINARUM CRITICAL RESULT CALLED TO, READ BACK BY AND VERIFIED WITH: [facility] BORBA AT 1042 08/16/15 DV    Report Status 08/17/2015 FINAL  Final   Organism ID, Bacteria ESCHERICHIA COLI  Final   Organism ID, Bacteria PROTEUS MIRABILIS  Final   Organism ID, Bacteria KLEBSIELLA PNEUMONIAE  Final   Organism ID, Bacteria ENTEROCOCCUS GALLINARUM  Final      Susceptibility   Escherichia coli - MIC*    AMPICILLIN >=32 RESISTANT Resistant     CEFTAZIDIME 4 RESISTANT Resistant     CEFAZOLIN >=64 RESISTANT Resistant     CEFTRIAXONE 16 RESISTANT Resistant     GENTAMICIN 2 SENSITIVE Sensitive     IMIPENEM >=16 RESISTANT  Resistant     TRIMETH/SULFA <=20 SENSITIVE Sensitive     Extended ESBL POSITIVE Resistant     PIP/TAZO Value in next row Resistant      RESISTANT>=128    CIPROFLOXACIN Value in next row Sensitive      SENSITIVE<=0.25    * HEAVY GROWTH ESCHERICHIA COLI   Klebsiella pneumoniae - MIC*    AMPICILLIN Value in next row Resistant      SENSITIVE<=0.25    CEFTAZIDIME Value in next row Resistant      SENSITIVE<=0.25    CEFAZOLIN Value in next row Resistant      SENSITIVE<=0.25    CEFTRIAXONE Value in next row Resistant      SENSITIVE<=0.25    CIPROFLOXACIN Value in next row Resistant      SENSITIVE<=0.25    GENTAMICIN Value in next row Sensitive      SENSITIVE<=0.25    IMIPENEM Value in next row Resistant      SENSITIVE<=0.25    TRIMETH/SULFA Value in next row Resistant      SENSITIVE<=0.25    PIP/TAZO Value in next row Resistant      RESISTANT>=128    * LIGHT GROWTH KLEBSIELLA PNEUMONIAE   Proteus mirabilis - MIC*    AMPICILLIN Value in next row Resistant  RESISTANT>=128    CEFTAZIDIME Value in next row Sensitive      RESISTANT>=128    CEFAZOLIN Value in next row Sensitive      RESISTANT>=128    CEFTRIAXONE Value in next row Sensitive      RESISTANT>=128    CIPROFLOXACIN Value in next row Sensitive      RESISTANT>=128    GENTAMICIN Value in next row Sensitive      RESISTANT>=128    IMIPENEM Value in next row Sensitive      RESISTANT>=128    TRIMETH/SULFA Value in next row Sensitive      RESISTANT>=128    PIP/TAZO Value in next row Sensitive      SENSITIVE<=4    * MODERATE GROWTH PROTEUS MIRABILIS   Enterococcus gallinarum - MIC*    AMPICILLIN Value in next row Resistant      SENSITIVE<=4    GENTAMICIN SYNERGY Value in next row Sensitive      SENSITIVE<=4    CIPROFLOXACIN Value in next row Resistant      RESISTANT>=8    TETRACYCLINE Value in next row Resistant      RESISTANT>=16    * MODERATE GROWTH ENTEROCOCCUS GALLINARUM  Wound culture     Status: None    Collection Time: 08/15/15  2:53 PM  Result Value Ref Range Status   Specimen Description WOUND  Final   Special Requests NONE  Final   Gram Stain FEW WBC SEEN NO ORGANISMS SEEN   Final   Culture NO GROWTH 3 DAYS  Final   Report Status 08/18/2015 FINAL  Final  C difficile quick scan w PCR reflex     Status: None   Collection Time: 08/17/15 11:34 AM  Result Value Ref Range Status   C Diff antigen NEGATIVE NEGATIVE Final   C Diff toxin NEGATIVE NEGATIVE Final   C Diff interpretation Negative for C. difficile  Final    Coagulation Studies: No results for input(s): LABPROT, INR in the last 72 hours.  Urinalysis: No results for input(s): COLORURINE, LABSPEC, PHURINE, GLUCOSEU, HGBUR, BILIRUBINUR, KETONESUR, PROTEINUR, UROBILINOGEN, NITRITE, LEUKOCYTESUR in the last 72 hours.  Invalid input(s): APPERANCEUR    Imaging: Dg Chest Port 1 View  08/18/2015   CLINICAL DATA:  Acute pulmonary edema  EXAM: PORTABLE CHEST 1 VIEW  COMPARISON:  08/13/2015  FINDINGS: Tracheostomy tube is appropriately positioned. Feeding tube tip terminates over the expected location of the second portion of the duodenum. Right IJ approach hemodialysis catheter terminates over the right atrium. Moderate enlargement of the cardiomediastinal silhouette is reidentified with curvilinear right lower lobe presumed atelectasis. Trace pleural fluid.  IMPRESSION: Cardiomegaly without focal acute finding.   Electronically Signed   By: Christiana Pellant M.D.   On: 08/18/2015 18:52     Medications:   . feeding supplement (VITAL 1.5 CAL) 1,000 mL (08/19/15 1244)   . sodium chloride   Intravenous Once  . sodium chloride   Intravenous Once  . acidophilus  1 capsule Oral Daily  . DAPTOmycin (CUBICIN)  IV  500 mg Intravenous Q48H  . epoetin (EPOGEN/PROCRIT) injection  4,000 Units Intravenous Q T,Th,Sa-HD  . feeding supplement (PRO-STAT SUGAR FREE 64)  30 mL Per Tube 6 times per day  . free water  100 mL Per Tube 3 times per day   . hydrocortisone  10 mg Oral BID  . insulin aspart  0-15 Units Subcutaneous 6 times per day  . meropenem (MERREM) IV  500 mg Intravenous Q24H  . midodrine  10  mg Oral TID WC  . multivitamin  5 mL Per Tube Daily  . nystatin   Topical TID  . sodium hypochlorite   Irrigation BID   sodium chloride, sodium chloride, alteplase, anticoagulant sodium citrate, lidocaine (PF), lidocaine-prilocaine, pentafluoroprop-tetrafluoroeth  Assessment/ Plan:  49 y.o. 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  1. End-stage renal disease on hemodialysis on HD TTHS. The patient has been on dialysis since October of 2014.  R IJ permcath. ,  - only 500 cc removed with HD yesterday before BP dropped - patient is developing large amount of Liberal edema -  PLAN- trial of CRRT  2. Anemia of CKD: received blood transfusion this admission. - continue EPO  3. Secondary hyperparathyroidism: phos low at 2.0. Replaced per icu protocol  4. Septic shock: Multiple potential sources, has two catheters in place, also has a sacral decubitis, and also has trach in place.  - multiple organisms growing via wound culture - appreciate ID recommendations  5. peripheral edema - UF with HD/CRRT with iv albumin support as tolerated   6.  Thrombocytopenia:  Platelets declining, defer further work up to [employer] and hospitalist.  pack catheter with citrate   patient's husband - John,     LOS:  8 Alecia Doi 10/1/20168:38 AM

## 2015-08-20 NOTE — Progress Notes (Signed)
CRRT therapy started.

## 2015-08-20 NOTE — Progress Notes (Signed)
Pharmacy Consult for Daptomycin/Meropenem Indication: Sepsis/Sacral decubitus  Allergies  Allergen Reactions  . Contrast Media [Iodinated Diagnostic Agents] Anaphylaxis  . Ampicillin Rash    Patient Measurements: Height:  (175.3 cm) Weight: 187 lb 9.8 oz (85.1 kg) IBW/kg (Calculated) : 66.2   Vital Signs: Temp: 99 F (37.2 C) (10/01 1200) Temp Source: Axillary (10/01 1200) BP: 113/68 mmHg (10/01 1400) Pulse Rate: 98 (10/01 1400) Intake/Output from previous day: 09/30 0701 - 10/01 0700 In: 1076.3 [I.V.:130; NG/GT:946.3] Out: 797 [Stool:275] Intake/Output from this shift: Total I/O In: 220 [I.V.:70; IV Piggyback:150] Out: -   Labs:  Recent Labs  08/18/15 0507 08/18/15 0939 08/19/15 0548 08/19/15 1137 08/20/15 0422  WBC 7.5 7.2 9.1  --   --   HGB 8.4* 8.1* 8.4*  --   --   PLT 20* 20* 34*  --   --   CREATININE 1.24* 1.30*  --  1.03* 0.72   Estimated Creatinine Clearance: 98 mL/min (by C-G formula based on Cr of 0.72). No results for input(s): VANCOTROUGH, VANCOPEAK, VANCORANDOM, GENTTROUGH, GENTPEAK, GENTRANDOM, TOBRATROUGH, TOBRAPEAK, TOBRARND, AMIKACINPEAK, AMIKACINTROU, AMIKACIN in the last 72 hours.   Microbiology: Recent Results (from the past 720 hour(s))  Culture, blood (routine x 2)     Status: None   Collection Time: 08/01/15  7:00 PM  Result Value Ref Range Status   Specimen Description BLOOD RIGHT ANTECUBITAL  Final   Special Requests BOTTLES DRAWN AEROBIC ONLY 5CC  Final   Culture NO GROWTH 5 DAYS  Final   Report Status 08/06/2015 FINAL  Final  Culture, blood (routine x 2)     Status: None   Collection Time: 08/01/15  7:10 PM  Result Value Ref Range Status   Specimen Description BLOOD RIGHT HAND  Final   Special Requests IN PEDIATRIC BOTTLE 3CC  Final   Culture  Setup Time   Final    GRAM POSITIVE COCCI IN CLUSTERS AEROBIC BOTTLE ONLY CRITICAL RESULT CALLED TO, READ BACK BY AND VERIFIED WITH: D TAYLOR,RN AT 1115 08/02/15 BY L BENFIELD    Culture STAPHYLOCOCCUS SPECIES (COAGULASE NEGATIVE)  Final   Report Status 08/06/2015 FINAL  Final   Organism ID, Bacteria STAPHYLOCOCCUS SPECIES (COAGULASE NEGATIVE)  Final      Susceptibility   Staphylococcus species (coagulase negative) - MIC*    CIPROFLOXACIN >=8 RESISTANT Resistant     ERYTHROMYCIN <=0.25 SENSITIVE Sensitive     GENTAMICIN 8 INTERMEDIATE Intermediate     OXACILLIN >=4 RESISTANT Resistant     TETRACYCLINE <=1 SENSITIVE Sensitive     VANCOMYCIN 1 SENSITIVE Sensitive     TRIMETH/SULFA 160 RESISTANT Resistant     CLINDAMYCIN <=0.25 SENSITIVE Sensitive     RIFAMPIN <=0.5 SENSITIVE Sensitive     Inducible Clindamycin NEGATIVE Sensitive     * STAPHYLOCOCCUS SPECIES (COAGULASE NEGATIVE)  Culture, blood (routine x 2)     Status: None   Collection Time: 08/02/15  7:00 PM  Result Value Ref Range Status   Specimen Description BLOOD RIGHT ANTECUBITAL  Final   Special Requests BOTTLES DRAWN AEROBIC ONLY 10CC  Final   Culture  Setup Time   Final    GRAM POSITIVE COCCI IN CLUSTERS AEROBIC BOTTLE ONLY CRITICAL RESULT CALLED TO, READ BACK BY AND VERIFIED WITH: DR Gwenevere Abbot RN 1549 08/03/15 A BROWNING    Culture   Final    STAPHYLOCOCCUS SPECIES (COAGULASE NEGATIVE) SUSCEPTIBILITIES PERFORMED ON PREVIOUS CULTURE WITHIN THE LAST 5 DAYS.    Report Status 08/06/2015 FINAL  Final  Culture,  blood (routine x 2)     Status: None   Collection Time: 08/02/15  7:05 PM  Result Value Ref Range Status   Specimen Description BLOOD RIGHT HAND  Final   Special Requests BOTTLES DRAWN AEROBIC AND ANAEROBIC 10CC  Final   Culture NO GROWTH 5 DAYS  Final   Report Status 08/07/2015 FINAL  Final  Blood Culture (routine x 2)     Status: None   Collection Time: 07/25/2015  8:51 AM  Result Value Ref Range Status   Specimen Description BLOOD UNKO  Final   Special Requests BOTTLES DRAWN AEROBIC AND ANAEROBIC  3CC  Final   Culture NO GROWTH 5 DAYS  Final   Report Status 08/17/2015 FINAL  Final  Blood  Culture (routine x 2)     Status: None   Collection Time: 08/09/2015  9:20 AM  Result Value Ref Range Status   Specimen Description BLOOD LEFT ARM  Final   Special Requests BOTTLES DRAWN AEROBIC AND ANAEROBIC  1CC  Final   Culture NO GROWTH 5 DAYS  Final   Report Status 08/17/2015 FINAL  Final  Wound culture     Status: None   Collection Time: 07/21/2015  9:20 AM  Result Value Ref Range Status   Specimen Description DECUBITIS  Final   Special Requests Normal  Final   Gram Stain   Final    FEW WBC SEEN MANY GRAM NEGATIVE RODS RARE GRAM POSITIVE COCCI    Culture   Final    HEAVY GROWTH ESCHERICHIA COLI MODERATE GROWTH ENTEROBACTER AEROGENES PROTEUS MIRABILIS HEAVY GROWTH ENTEROCOCCUS SPECIES VRE HAVE INTRINSIC RESISTANCE TO MOST COMMONLY USED ANTIBIOTICS AND THE ABILITY TO ACQUIRE RESISTANCE TO MOST AVAILABLE ANTIBIOTICS.    Report Status 08/16/2015 FINAL  Final   Organism ID, Bacteria ESCHERICHIA COLI  Final   Organism ID, Bacteria ENTEROBACTER AEROGENES  Final   Organism ID, Bacteria PROTEUS MIRABILIS  Final   Organism ID, Bacteria ENTEROCOCCUS SPECIES  Final      Susceptibility   Enterobacter aerogenes - MIC*    CEFTAZIDIME <=1 SENSITIVE Sensitive     CEFAZOLIN >=64 RESISTANT Resistant     CEFTRIAXONE <=1 SENSITIVE Sensitive     CIPROFLOXACIN <=0.25 SENSITIVE Sensitive     GENTAMICIN <=1 SENSITIVE Sensitive     IMIPENEM 1 SENSITIVE Sensitive     TRIMETH/SULFA <=20 SENSITIVE Sensitive     * MODERATE GROWTH ENTEROBACTER AEROGENES   Escherichia coli - MIC*    AMPICILLIN <=2 SENSITIVE Sensitive     CEFTAZIDIME <=1 SENSITIVE Sensitive     CEFAZOLIN <=4 SENSITIVE Sensitive     CEFTRIAXONE <=1 SENSITIVE Sensitive     CIPROFLOXACIN <=0.25 SENSITIVE Sensitive     GENTAMICIN <=1 SENSITIVE Sensitive     IMIPENEM <=0.25 SENSITIVE Sensitive     TRIMETH/SULFA <=20 SENSITIVE Sensitive     * HEAVY GROWTH ESCHERICHIA COLI   Proteus mirabilis - MIC*    AMPICILLIN >=32 RESISTANT Resistant      CEFTAZIDIME <=1 SENSITIVE Sensitive     CEFAZOLIN 8 SENSITIVE Sensitive     CEFTRIAXONE <=1 SENSITIVE Sensitive     CIPROFLOXACIN <=0.25 SENSITIVE Sensitive     GENTAMICIN <=1 SENSITIVE Sensitive     IMIPENEM 1 SENSITIVE Sensitive     TRIMETH/SULFA <=20 SENSITIVE Sensitive     * PROTEUS MIRABILIS   Enterococcus species - MIC*    AMPICILLIN >=32 RESISTANT Resistant     VANCOMYCIN >=32 RESISTANT Resistant     GENTAMICIN SYNERGY SENSITIVE Sensitive  TETRACYCLINE Value in next row Resistant      RESISTANT>=16    * HEAVY GROWTH ENTEROCOCCUS SPECIES  MRSA PCR Screening     Status: None   Collection Time: 09/03/15  2:38 PM  Result Value Ref Range Status   MRSA by PCR NEGATIVE NEGATIVE Final    Comment:        The GeneXpert MRSA Assay (FDA approved for NASAL specimens only), is one component of a comprehensive MRSA colonization surveillance program. It is not intended to diagnose MRSA infection nor to guide or monitor treatment for MRSA infections.   Blood culture (single)     Status: None   Collection Time: 09/03/15  3:36 PM  Result Value Ref Range Status   Specimen Description BLOOD RIGHT ASSIST CONTROL  Final   Special Requests BOTTLES DRAWN AEROBIC AND ANAEROBIC  Final   Culture NO GROWTH 5 DAYS  Final   Report Status 08/17/2015 FINAL  Final  Wound culture     Status: None   Collection Time: 08/13/15 12:37 PM  Result Value Ref Range Status   Specimen Description WOUND  Final   Special Requests Normal  Final   Gram Stain   Final    FEW WBC SEEN TOO NUMEROUS TO COUNT GRAM NEGATIVE RODS FEW GRAM POSITIVE COCCI    Culture   Final    HEAVY GROWTH ESCHERICHIA COLI MODERATE GROWTH PROTEUS MIRABILIS LIGHT GROWTH KLEBSIELLA PNEUMONIAE MODERATE GROWTH ENTEROCOCCUS GALLINARUM CRITICAL RESULT CALLED TO, READ BACK BY AND VERIFIED WITH: [facility] BORBA AT 1042 08/16/15 DV    Report Status 08/17/2015 FINAL  Final   Organism ID, Bacteria ESCHERICHIA COLI  Final   Organism  ID, Bacteria PROTEUS MIRABILIS  Final   Organism ID, Bacteria KLEBSIELLA PNEUMONIAE  Final   Organism ID, Bacteria ENTEROCOCCUS GALLINARUM  Final      Susceptibility   Escherichia coli - MIC*    AMPICILLIN >=32 RESISTANT Resistant     CEFTAZIDIME 4 RESISTANT Resistant     CEFAZOLIN >=64 RESISTANT Resistant     CEFTRIAXONE 16 RESISTANT Resistant     GENTAMICIN 2 SENSITIVE Sensitive     IMIPENEM >=16 RESISTANT Resistant     TRIMETH/SULFA <=20 SENSITIVE Sensitive     Extended ESBL POSITIVE Resistant     PIP/TAZO Value in next row Resistant      RESISTANT>=128    CIPROFLOXACIN Value in next row Sensitive      SENSITIVE<=0.25    * HEAVY GROWTH ESCHERICHIA COLI   Klebsiella pneumoniae - MIC*    AMPICILLIN Value in next row Resistant      SENSITIVE<=0.25    CEFTAZIDIME Value in next row Resistant      SENSITIVE<=0.25    CEFAZOLIN Value in next row Resistant      SENSITIVE<=0.25    CEFTRIAXONE Value in next row Resistant      SENSITIVE<=0.25    CIPROFLOXACIN Value in next row Resistant      SENSITIVE<=0.25    GENTAMICIN Value in next row Sensitive      SENSITIVE<=0.25    IMIPENEM Value in next row Resistant      SENSITIVE<=0.25    TRIMETH/SULFA Value in next row Resistant      SENSITIVE<=0.25    PIP/TAZO Value in next row Resistant      RESISTANT>=128    * LIGHT GROWTH KLEBSIELLA PNEUMONIAE   Proteus mirabilis - MIC*    AMPICILLIN Value in next row Resistant      RESISTANT>=128    CEFTAZIDIME  Value in next row Sensitive      RESISTANT>=128    CEFAZOLIN Value in next row Sensitive      RESISTANT>=128    CEFTRIAXONE Value in next row Sensitive      RESISTANT>=128    CIPROFLOXACIN Value in next row Sensitive      RESISTANT>=128    GENTAMICIN Value in next row Sensitive      RESISTANT>=128    IMIPENEM Value in next row Sensitive      RESISTANT>=128    TRIMETH/SULFA Value in next row Sensitive      RESISTANT>=128    PIP/TAZO Value in next row Sensitive      SENSITIVE<=4     * MODERATE GROWTH PROTEUS MIRABILIS   Enterococcus gallinarum - MIC*    AMPICILLIN Value in next row Resistant      SENSITIVE<=4    GENTAMICIN SYNERGY Value in next row Sensitive      SENSITIVE<=4    CIPROFLOXACIN Value in next row Resistant      RESISTANT>=8    TETRACYCLINE Value in next row Resistant      RESISTANT>=16    * MODERATE GROWTH ENTEROCOCCUS GALLINARUM  Wound culture     Status: None   Collection Time: 08/15/15  2:53 PM  Result Value Ref Range Status   Specimen Description WOUND  Final   Special Requests NONE  Final   Gram Stain FEW WBC SEEN NO ORGANISMS SEEN   Final   Culture NO GROWTH 3 DAYS  Final   Report Status 08/18/2015 FINAL  Final  C difficile quick scan w PCR reflex     Status: None   Collection Time: 08/17/15 11:34 AM  Result Value Ref Range Status   C Diff antigen NEGATIVE NEGATIVE Final   C Diff toxin NEGATIVE NEGATIVE Final   C Diff interpretation Negative for C. difficile  Final    Medical History: Past Medical History  Diagnosis Date  . Obesity   . Dyslipidemia   . Hypertension   . Coronary artery disease     s/p BMS 2010 LAD  . Dysrhythmia     ventricular tachycardia resolved after LAD stent and beta blocker  . Diabetes mellitus     with retinopathy, neuropathy and microalbuminemia  . ESRD (end stage renal disease) on dialysis     Medications:  Scheduled:  . sodium chloride   Intravenous Once  . sodium chloride   Intravenous Once  . acidophilus  1 capsule Oral Daily  . albumin human  12.5 g Intravenous BID  . DAPTOmycin (CUBICIN)  IV  500 mg Intravenous Q48H  . epoetin (EPOGEN/PROCRIT) injection  20,000 Units Subcutaneous Weekly  . feeding supplement (PRO-STAT SUGAR FREE 64)  30 mL Per Tube 6 times per day  . free water  100 mL Per Tube 3 times per day  . hydrocortisone  10 mg Oral BID  . insulin aspart  0-15 Units Subcutaneous 6 times per day  . meropenem (MERREM) IV  1 g Intravenous Q12H  . midodrine  10 mg Oral TID WC  .  multivitamin  5 mL Per Tube Daily  . nystatin   Topical TID  . sodium hypochlorite   Irrigation BID   Infusions:  . feeding supplement (VITAL 1.5 CAL) 1,000 mL (08/19/15 1244)  . pureflow 1,500 mL/hr at 08/20/15 1450   PRN: sodium chloride, sodium chloride, alteplase, anticoagulant sodium citrate, lidocaine (PF), lidocaine-prilocaine, pentafluoroprop-tetrafluoroeth  Assessment: 49 y/o F with ESRD on HD with sepsis likely from  stage IV sacral decubitus with wound growing multiple MDR organisms including VRE. Patient is on day 8 of antibiotics (day 4 of daptomycin and day 8 of meropenem).  Goal of Therapy:  Resolution of infection  Plan:   Patient converted to CRRT. Will continue daptomycin 6 mg/kg q 48 hours and meropenem 1000 mg IV Q12 hours.   Simpson,Michael L 08/20/2015,2:56 PM

## 2015-08-20 NOTE — Progress Notes (Signed)
ELECTROLYTE MANAGEMENT - INITIAL  Pharmacy Consult for Electrolyte Management Indication: hypokalemia  Allergies  Allergen Reactions  . Contrast Media [Iodinated Diagnostic Agents] Anaphylaxis  . Ampicillin Rash    Patient Measurements: Height:  (175.3 cm) Weight: 187 lb 9.8 oz (85.1 kg) IBW/kg (Calculated) : 66.2   Vital Signs: BP: 96/66 mmHg (09/30 1945) Pulse Rate: 96 (09/30 2032) Intake/Output from previous day: 09/30 0701 - 10/01 0700 In: 1066.3 [I.V.:120; NG/GT:946.3] Out: 797 [Stool:275] Intake/Output from this shift: Total I/O In: -  Out: 522 [Other:522]  Labs:  Recent Labs  08/18/15 0507 08/18/15 0939 08/19/15 0548  WBC 7.5 7.2 9.1  HGB 8.4* 8.1* 8.4*  HCT 26.0* 25.5* 26.3*  PLT 20* 20* 34*     Recent Labs  08/18/15 0507 08/18/15 0939 08/19/15 1137 08/20/15 0422  NA 140 136 141 141  K 4.0 3.8 3.8 3.8  CL 104 102 105 104  CO2 32 31 33* 33*  GLUCOSE 136* 132* 162* 101*  BUN 31* 31* 28* 21*  CREATININE 1.24* 1.30* 1.03* 0.72  CALCIUM 7.6* 7.3* 7.7* 7.4*  PHOS  --  2.0*  --   --   PROT 4.4*  --   --   --   ALBUMIN 1.7* 1.7*  --   --   AST 23  --   --   --   ALT 22  --   --   --   ALKPHOS 384*  --   --   --   BILITOT 1.0  --   --   --    Estimated Creatinine Clearance: 98 mL/min (by C-G formula based on Cr of 0.72).    Recent Labs  08/19/15 2150 08/20/15 0030 08/20/15 0431  GLUCAP 116* 127* 97    Medical History: Past Medical History  Diagnosis Date  . Obesity   . Dyslipidemia   . Hypertension   . Coronary artery disease     s/p BMS 2010 LAD  . Dysrhythmia     ventricular tachycardia resolved after LAD stent and beta blocker  . Diabetes mellitus     with retinopathy, neuropathy and microalbuminemia  . ESRD (end stage renal disease) on dialysis     Medications:  Scheduled:  . sodium chloride   Intravenous Once  . sodium chloride   Intravenous Once  . acidophilus  1 capsule Oral Daily  . DAPTOmycin (CUBICIN)  IV   500 mg Intravenous Q48H  . epoetin (EPOGEN/PROCRIT) injection  4,000 Units Intravenous Q T,Th,Sa-HD  . feeding supplement (PRO-STAT SUGAR FREE 64)  30 mL Per Tube 6 times per day  . free water  100 mL Per Tube 3 times per day  . hydrocortisone  10 mg Oral BID  . insulin aspart  0-15 Units Subcutaneous 6 times per day  . meropenem (MERREM) IV  500 mg Intravenous Q24H  . midodrine  10 mg Oral TID WC  . multivitamin  5 mL Per Tube Daily  . nystatin   Topical TID  . sodium hypochlorite   Irrigation BID    Assessment: 49 yo female admitted for sepsis, found to have hypokalemia. Pharmacy consulted for electrolyte management.  Plan:  K is wnl so no need for further replacement. Ca corrects to  9.5. Will f/u AM labs.   1001 0431 K WNL, will follow up AM labs.   Carola Frost, Pharm.D. Clinical Pharmacist 08/20/2015,6:11 AM

## 2015-08-20 NOTE — Progress Notes (Signed)
Nutrition Follow-up   INTERVENTION:   EN: Continue current TF regimen as ordered. RD confirmed adequate Pro-stat available this am (times 6)   NUTRITION DIAGNOSIS:   Inadequate oral intake related to wound healing, acute illness, chronic illness as evidenced by NPO status, estimated needs.  GOAL:   Patient will meet greater than or equal to 90% of their needs  MONITOR:    (Energy Intake, Anthropometrics, Digestive System, Electrolyte/Renal Profile, Glucose Profile)  REASON FOR ASSESSMENT:   Consult Enteral/tube feeding initiation and management  ASSESSMENT:   Pt remains on trach collar; noted surgery recommending possible tertiary care transfer for wound management. Pt now scheduled for trial of CRRT as only removed yesterday with HD secondary to low BP.   EN: Vital 1.5 TF infusing at rate of 35 ml/hr, Prostat ordered for 6 times per day   Skin:  (stage IV ulcer on foot, unstageable on sacrum)   Gastrointestinal Profile: Last BM: multiple loose watery liquid stool via flexiseal, documented in last 24 hours   Medications: acidophilus, daptomycin, ss novolog, meropenum, midodrine, albumin   Electrolyte/Renal Profile and Glucose Profile:   Recent Labs Lab 08/15/15 0530 08/16/15 0422 08/17/15 0446  08/18/15 0939 08/19/15 1137 08/20/15 0422  NA 141 142 141  < > 136 141 141  K 4.0 3.8 3.0*  < > 3.8 3.8 3.8  CL 104 105 105  < > 102 105 104  CO2 32 31 31  < > 31 33* 33*  BUN 17 25* 18  < > 31* 28* 21*  CREATININE 1.11* 1.45* 0.94  < > 1.30* 1.03* 0.72  CALCIUM 7.8* 7.6* 7.2*  < > 7.3* 7.7* 7.4*  MG  --  1.7 1.7  --   --   --   --   PHOS 1.6* 3.4  --   --  2.0*  --   --   GLUCOSE 76 234* 148*  < > 132* 162* 101*  < > = values in this interval not displayed. Protein Profile:  Recent Labs Lab 08/18/15 0507 08/18/15 0939  ALBUMIN 1.7* 1.7*     Weight Trend since Admission: Filed Weights   08/18/15 1240 08/19/15 0557 08/19/15 1615  Weight:  180 lb 1.9 oz (81.7 kg) 186 lb 1.1 oz (84.4 kg) 187 lb 9.8 oz (85.1 kg)     BMI:  Body mass index is 27.69 kg/(m^2).  Estimated Nutritional Needs:   Kcal:  1610-9604 kcals (BEE 1434, 1.2 AF, 1.2-1.4 IF)   Protein:  113-150 g/kg (1.5-2.0 g/kg) but may be closer to 150-188 g (2.0-2.5 g/kg)   Fluid:  1000 mL plus UOP   HIGH Care Level  Leda Quail, RD, LDN Pager [phone]

## 2015-08-20 DEATH — deceased

## 2015-08-21 ENCOUNTER — Inpatient Hospital Stay: Payer: 59

## 2015-08-21 LAB — RENAL FUNCTION PANEL
ALBUMIN: 2.1 g/dL — AB (ref 3.5–5.0)
ANION GAP: 4 — AB (ref 5–15)
ANION GAP: 5 (ref 5–15)
Albumin: 2.1 g/dL — ABNORMAL LOW (ref 3.5–5.0)
Albumin: 2.2 g/dL — ABNORMAL LOW (ref 3.5–5.0)
Albumin: 1.9 g/dL — ABNORMAL LOW (ref 3.5–5.0)
Anion gap: 1 — ABNORMAL LOW (ref 5–15)
Anion gap: 2 — ABNORMAL LOW (ref 5–15)
BUN: 25 mg/dL — ABNORMAL HIGH (ref 6–20)
BUN: 26 mg/dL — ABNORMAL HIGH (ref 6–20)
BUN: 27 mg/dL — AB (ref 6–20)
BUN: 32 mg/dL — AB (ref 6–20)
CALCIUM: 7.9 mg/dL — AB (ref 8.9–10.3)
CALCIUM: 8 mg/dL — AB (ref 8.9–10.3)
CHLORIDE: 106 mmol/L (ref 101–111)
CHLORIDE: 106 mmol/L (ref 101–111)
CO2: 29 mmol/L (ref 22–32)
CO2: 31 mmol/L (ref 22–32)
CO2: 32 mmol/L (ref 22–32)
CO2: 32 mmol/L (ref 22–32)
CREATININE: 0.68 mg/dL (ref 0.44–1.00)
CREATININE: 0.61 mg/dL (ref 0.44–1.00)
CREATININE: 0.78 mg/dL (ref 0.44–1.00)
Calcium: 8.2 mg/dL — ABNORMAL LOW (ref 8.9–10.3)
Calcium: 8.1 mg/dL — ABNORMAL LOW (ref 8.9–10.3)
Chloride: 104 mmol/L (ref 101–111)
Chloride: 106 mmol/L (ref 101–111)
Creatinine, Ser: 0.71 mg/dL (ref 0.44–1.00)
GFR calc Af Amer: >60 mL/min (ref >60)
GFR calc Af Amer: >60 mL/min (ref >60)
GFR calc non Af Amer: >60 mL/min (ref >60)
GFR calc non Af Amer: >60 mL/min (ref >60)
GLUCOSE: 124 mg/dL — AB (ref 65–99)
GLUCOSE: 155 mg/dL — AB (ref 65–99)
GLUCOSE: 130 mg/dL — AB (ref 65–99)
Glucose, Bld: 121 mg/dL — ABNORMAL HIGH (ref 65–99)
PHOSPHORUS: 1.7 mg/dL — AB (ref 2.5–4.6)
POTASSIUM: 3.6 mmol/L (ref 3.5–5.1)
Phosphorus: 1.7 mg/dL — ABNORMAL LOW (ref 2.5–4.6)
Phosphorus: 1.2 mg/dL — ABNORMAL LOW (ref 2.5–4.6)
Phosphorus: 2.6 mg/dL (ref 2.5–4.6)
Potassium: 3.7 mmol/L (ref 3.5–5.1)
Potassium: 3.9 mmol/L (ref 3.5–5.1)
Potassium: 4 mmol/L (ref 3.5–5.1)
SODIUM: 139 mmol/L (ref 135–145)
SODIUM: 140 mmol/L (ref 135–145)
Sodium: 139 mmol/L (ref 135–145)
Sodium: 140 mmol/L (ref 135–145)

## 2015-08-21 LAB — PROTIME-INR
INR: 1.33
Prothrombin Time: 16.7 seconds — ABNORMAL HIGH (ref 11.4–15.0)

## 2015-08-21 LAB — CBC WITH DIFFERENTIAL/PLATELET
Basophils Absolute: 0 10*3/uL (ref 0–0.1)
Basophils Relative: 0 %
Eosinophils Absolute: 0 10*3/uL (ref 0–0.7)
Eosinophils Relative: 0 %
HEMATOCRIT: 25.9 % — AB (ref 35.0–47.0)
HEMOGLOBIN: 8.4 g/dL — AB (ref 12.0–16.0)
LYMPHS ABS: 1.1 10*3/uL (ref 1.0–3.6)
MCH: 30.4 pg (ref 26.0–34.0)
MCHC: 32.3 g/dL (ref 32.0–36.0)
MCV: 94.2 fL (ref 80.0–100.0)
Monocytes Absolute: 0.6 10*3/uL (ref 0.2–0.9)
NEUTROS ABS: 9.2 10*3/uL — AB (ref 1.4–6.5)
Platelets: 60 10*3/uL — ABNORMAL LOW (ref 150–440)
RBC: 2.74 MIL/uL — AB (ref 3.80–5.20)
RDW: 16.7 % — ABNORMAL HIGH (ref 11.5–14.5)
WBC: 11 10*3/uL (ref 3.6–11.0)

## 2015-08-21 LAB — MAGNESIUM
MAGNESIUM: 1.6 mg/dL — AB (ref 1.7–2.4)
MAGNESIUM: 1.6 mg/dL — AB (ref 1.7–2.4)
MAGNESIUM: 2 mg/dL (ref 1.7–2.4)
Magnesium: 1.6 mg/dL — ABNORMAL LOW (ref 1.7–2.4)
Magnesium: 2 mg/dL (ref 1.7–2.4)

## 2015-08-21 LAB — GLUCOSE, CAPILLARY
GLUCOSE-CAPILLARY: 98 mg/dL (ref 65–99)
GLUCOSE-CAPILLARY: 143 mg/dL — AB (ref 65–99)
GLUCOSE-CAPILLARY: 130 mg/dL — AB (ref 65–99)
GLUCOSE-CAPILLARY: 116 mg/dL — AB (ref 65–99)
Glucose-Capillary: 122 mg/dL — ABNORMAL HIGH (ref 65–99)
Glucose-Capillary: 77 mg/dL (ref 65–99)

## 2015-08-21 LAB — FIBRIN DERIVATIVES D-DIMER (ARMC ONLY): FIBRIN DERIVATIVES D-DIMER (ARMC): 3858 — AB (ref 0–499)

## 2015-08-21 LAB — APTT: aPTT: 39 seconds — ABNORMAL HIGH (ref 24–36)

## 2015-08-21 LAB — FIBRINOGEN: FIBRINOGEN: 327 mg/dL (ref 210–470)

## 2015-08-21 MED ORDER — ASPIRIN 300 MG RE SUPP
300.0000 mg | Freq: Every day | RECTAL | Status: DC
  Administered 2015-08-21 – 2015-08-22 (×2): 300 mg via RECTAL
  Filled 2015-08-21 (×2): qty 1

## 2015-08-21 MED ORDER — POTASSIUM PHOSPHATES 15 MMOLE/5ML IV SOLN
20.0000 mmol | Freq: Once | INTRAVENOUS | Status: AC
  Administered 2015-08-21: 20 mmol via INTRAVENOUS
  Filled 2015-08-21: qty 6.67

## 2015-08-21 MED ORDER — MAGNESIUM SULFATE 2 GM/50ML IV SOLN
2.0000 g | Freq: Once | INTRAVENOUS | Status: AC
  Administered 2015-08-21: 2 g via INTRAVENOUS
  Filled 2015-08-21: qty 50

## 2015-08-21 NOTE — Progress Notes (Addendum)
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 Hypokalemia, resolved P:   Monitor BMET intermittently Monitor I/Os Correct electrolytes as indicated Nephrollogy following.   GASTROINTESTINAL A:  Hx of C. Diff - PCR neg P:   SUP: N/I unless requires vent support Begin D III diet with supervision 9/25 Check c.diff PCR>>neg TF goal rate reduced to 35, due to have moderate secretions and concerns for aspiration   HEMATOLOGIC A:   Acute on chronic anemia Thrombocytopenia, . Etiology unclear. Was on heparin PTA P:  DVT px: SCDs Monitor CBC intermittently Transfuse per usual guidelines Check HIT panel 9/25 - negative Might need platelet transfusion prior to any future debridement  INFECTIOUS A:   Severe sepsis Sacral decubitus ulcer with abscess P:   Monitor temp, WBC count Micro and abx as above Surgery following ID recs appreciated - multiple MDR organisms  ENDOCRINE A:  DM 2, controlled Chronic prednisone therapy - chronic dose 10 mg BID Secondary adrenal insuff P:   CBGs and SSI - mod scale Stress dose steroids - wean as able based on BP  NEUROLOGIC A:   Acute encephalopathy, -somnolent still, suspect due to chronic sepsis with metabolic encephalopathy.  P:   RASS goal: 0 Minimize sedating meds - baseline mentation is talking and following commands.  - husband concerned about patient mentation not improving, concerned about sacral ulcer  organisms; CT head negative. He believes that at the pt's last episode of infection she recovered much faster, and feels that abx choices are not appropriate, despite reviewing this with ID.   -He believes that the patient's reduced mental status is not from encephalopathy but from an infection of the brain or spinal column and he would like an MRI and "will not be satisfied with anything less than an MRI" of his wife. Neurology has been consulted.   ADMISSION DATE:  08/19/2015 CONSULTATION DATE: 9/23  INITIAL PRESENTATION:  68 F who has been in medical facilities (hosp, LTAC, rehab) for 2 yrs following gastric bypass surgery with multiple complications. Now with chronic trach, ESRD, profound debilitation, severe sacral pressure ulcer. Was seemingly making progress and transferred to rehab facility approx one week prior to this admission. She was sent to [facility] ED with AMS and hypotension. Working dx of severe sepsis/septic shock due to infected sacral pressure ulcer. Also has R side externalized HD cath and tunneled L IJ CVL. She has history of resistant bacterial infections. She is on chronic steroids. Her husband indicates that she has been diagnosed with adrenal insuff at some point during the past 2 yrs  MAJOR EVENTS/TEST RESULTS: 9/23 CT head: NAD 9/23 EEG: no epileptiform activity 9/23 PRBCs for Hgb 6.4 9/24 bedside debridement of sacral wound. Abscess drained 9/25 Off vasopressors. More alert. No distress. Worsening thrombocytopenia. Vanc DC'd  INDWELLING DEVICES:: Trach (chronic)  Tunneled R IJ HD cath (chronic) Tunneled L IJ CVL (chronic) L femoral A-line 9/23 >> 9/25  MICRO DATA: MRSA PCR 9/23 >>  NEG Urine 9/23 >>  Wound (swab) 9/23 >> many GNR, rare GPC >>  Blood 9/23 >> neg  Wound (debridement) 9/24 >> heavy GNR >> CRE, Enterococcus, K. Pneumoniae, P. Mirabilis, VRE CDiff 9/27>>neg   ANTIMICROBIALS:  Aztreonam 9/23 >> 9/24 Vanc 9/23 >> 9/25 Vanc 9/26>>9/27 Meropenem  9/23 >>  Daptomycin 9/27>>   SUBJECTIVE:  RASS, -1.  Nothing purposeful at this time. On Trach collar at 28%, having moderate secretions today.  Open eyes, but not tracking, husband at bedside Plt count mildly increasing   VITAL SIGNS: Temp:  [95.2 F (35.1 C)-99 F (37.2 C)] 95.2 F (35.1 C) (10/02 0800) Pulse Rate:  [86-114] 95 (10/02 0800) Resp:  [6-29] 6 (10/02 0800) BP: (87-149)/(43-129) 123/78 mmHg (10/02 0800) SpO2:  [94 %-100 %] 100 % (10/02 0800) FiO2 (%):  [28 %] 28 % (10/02 0430) Weight:  [80.3 kg (177 lb 0.5 oz)] 80.3 kg (177 lb 0.5 oz) (10/01 1600) HEMODYNAMICS:   VENTILATOR SETTINGS: Vent Mode:  [-]  FiO2 (%):  [28 %] 28 % INTAKE / OUTPUT:  Intake/Output Summary (Last 24 hours) at 08/21/15 0937 Last data filed at 08/21/15 0800  Gross per 24 hour  Intake 1421.25 ml  Output   1896 ml  Net -474.75 ml    PHYSICAL EXAMINATION: General: RASS 0, -1, + F/C, no respiratory distress Neuro: CNs intact, MAEs, DTRs symmetric HEENT: Cushingoid facies Neck: no JVD noted, trach site clean Cardiovascular: Regular, no M noted Lungs: No adventitious sounds Abdomen: Obese, soft, NT, diminished BS Ext: cool, diminished distal pulses, no edema Skin: very deep, malodorous sacral pressure ulcer  LABS:  CBC  Recent Labs Lab 08/18/15 0939 08/19/15 0548 08/21/15 0417  WBC 7.2 9.1 11.0  HGB 8.1* 8.4* 8.4*  HCT 25.5* 26.3* 25.9*  PLT 20* 34* 60*   Coag's  Recent Labs Lab 08/16/15 1323  APTT 82*  INR 1.48   BMET  Recent Labs Lab 08/20/15 0422 08/20/15 1934 08/20/15 2350  NA 141 141 140  K 3.8 3.8 3.9  CL 104 104 104  CO2 33* 32 32  BUN 21* 27* 26*  CREATININE 0.72 0.77 0.78  GLUCOSE 101* 132* 124*   Electrolytes  Recent Labs Lab 08/18/15 0939  08/20/15 0422 08/20/15 1934 08/20/15 2350 08/21/15 0417 08/21/15 0843  CALCIUM 7.3*  < > 7.4* 8.0* 8.0*  --   --   MG  --   --   --  1.7  --  1.6* 1.6*  PHOS 2.0*  --   --  1.7* 1.7*  --   --    < > = values in this interval not displayed. Sepsis Markers No results for input(s): LATICACIDVEN, PROCALCITON, O2SATVEN in the last 168 hours. ABG  Recent Labs Lab 08/15/15 0850 08/16/15 0521 08/17/15 1154  PHART 7.47* 7.46* 7.48*  PCO2ART 44 44 45  PO2ART 65* 92 111*   Liver Enzymes  Recent Labs Lab 08/18/15 0507 08/18/15 0939 08/20/15 1934 08/20/15 2350  AST 23  --   --   --   ALT 22  --   --   --   ALKPHOS 384*  --   --   --   BILITOT 1.0  --   --   --   ALBUMIN 1.7* 1.7* 1.9* 2.1*   Cardiac Enzymes No results for input(s): TROPONINI, PROBNP in the last 168 hours. Glucose  Recent Labs Lab 08/20/15 1131 08/20/15 1608 08/20/15 1941 08/20/15 2333 08/21/15 0400 08/21/15 0810  GLUCAP 114* 123* 120*  122* 98 143*     Deep Nicholos Johns, M.D.  [facility] On Call Pager - [phone] (please enter [phone]-[phone])      08/21/2015, 9:37 AM

## 2015-08-21 NOTE — Progress Notes (Signed)
Pharmacy Consult for Daptomycin/Meropenem Indication: Sepsis/Sacral decubitus  Allergies  Allergen Reactions  . Contrast Media [Iodinated Diagnostic Agents] Anaphylaxis  . Ampicillin Rash    Patient Measurements: Height:  (175.3 cm) Weight: 177 lb 0.5 oz (80.3 kg) IBW/kg (Calculated) : 66.2   Vital Signs: Temp: 98.1 F (36.7 C) (10/02 1400) Temp Source: Rectal (10/02 0900) BP: 118/66 mmHg (10/02 1400) Pulse Rate: 87 (10/02 1400) Intake/Output from previous day: 10/01 0701 - 10/02 0700 In: 1396.3 [I.V.:230; NG/GT:756.3; IV Piggyback:410] Out: 1822 [Stool:676] Intake/Output from this shift: Total I/O In: 485 [I.V.:60; NG/GT:275; IV Piggyback:150] Out: 434 [Other:434]  Labs:  Recent Labs  08/19/15 0548  08/20/15 1934 08/20/15 2350 08/21/15 0417 08/21/15 0839  WBC 9.1  --   --   --  11.0  --   HGB 8.4*  --   --   --  8.4*  --   PLT 34*  --   --   --  60*  --   CREATININE  --   < > 0.77 0.78  --  0.61  < > = values in this interval not displayed. Estimated Creatinine Clearance: 95.4 mL/min (by C-G formula based on Cr of 0.61). No results for input(s): VANCOTROUGH, VANCOPEAK, VANCORANDOM, GENTTROUGH, GENTPEAK, GENTRANDOM, TOBRATROUGH, TOBRAPEAK, TOBRARND, AMIKACINPEAK, AMIKACINTROU, AMIKACIN in the last 72 hours.   Microbiology: Recent Results (from the past 720 hour(s))  Culture, blood (routine x 2)     Status: None   Collection Time: 08/01/15  7:00 PM  Result Value Ref Range Status   Specimen Description BLOOD RIGHT ANTECUBITAL  Final   Special Requests BOTTLES DRAWN AEROBIC ONLY 5CC  Final   Culture NO GROWTH 5 DAYS  Final   Report Status 08/06/2015 FINAL  Final  Culture, blood (routine x 2)     Status: None   Collection Time: 08/01/15  7:10 PM  Result Value Ref Range Status   Specimen Description BLOOD RIGHT HAND  Final   Special Requests IN PEDIATRIC BOTTLE 3CC  Final   Culture  Setup Time   Final    GRAM POSITIVE COCCI IN CLUSTERS AEROBIC BOTTLE  ONLY CRITICAL RESULT CALLED TO, READ BACK BY AND VERIFIED WITH: D TAYLOR,RN AT 1115 08/02/15 BY L BENFIELD    Culture STAPHYLOCOCCUS SPECIES (COAGULASE NEGATIVE)  Final   Report Status 08/06/2015 FINAL  Final   Organism ID, Bacteria STAPHYLOCOCCUS SPECIES (COAGULASE NEGATIVE)  Final      Susceptibility   Staphylococcus species (coagulase negative) - MIC*    CIPROFLOXACIN >=8 RESISTANT Resistant     ERYTHROMYCIN <=0.25 SENSITIVE Sensitive     GENTAMICIN 8 INTERMEDIATE Intermediate     OXACILLIN >=4 RESISTANT Resistant     TETRACYCLINE <=1 SENSITIVE Sensitive     VANCOMYCIN 1 SENSITIVE Sensitive     TRIMETH/SULFA 160 RESISTANT Resistant     CLINDAMYCIN <=0.25 SENSITIVE Sensitive     RIFAMPIN <=0.5 SENSITIVE Sensitive     Inducible Clindamycin NEGATIVE Sensitive     * STAPHYLOCOCCUS SPECIES (COAGULASE NEGATIVE)  Culture, blood (routine x 2)     Status: None   Collection Time: 08/02/15  7:00 PM  Result Value Ref Range Status   Specimen Description BLOOD RIGHT ANTECUBITAL  Final   Special Requests BOTTLES DRAWN AEROBIC ONLY 10CC  Final   Culture  Setup Time   Final    GRAM POSITIVE COCCI IN CLUSTERS AEROBIC BOTTLE ONLY CRITICAL RESULT CALLED TO, READ BACK BY AND VERIFIED WITH: DR Gwenevere Abbot RN 1549 08/03/15 A BROWNING  Culture   Final    STAPHYLOCOCCUS SPECIES (COAGULASE NEGATIVE) SUSCEPTIBILITIES PERFORMED ON PREVIOUS CULTURE WITHIN THE LAST 5 DAYS.    Report Status 08/06/2015 FINAL  Final  Culture, blood (routine x 2)     Status: None   Collection Time: 08/02/15  7:05 PM  Result Value Ref Range Status   Specimen Description BLOOD RIGHT HAND  Final   Special Requests BOTTLES DRAWN AEROBIC AND ANAEROBIC 10CC  Final   Culture NO GROWTH 5 DAYS  Final   Report Status 08/07/2015 FINAL  Final  Blood Culture (routine x 2)     Status: None   Collection Time: Aug 26, 2015  8:51 AM  Result Value Ref Range Status   Specimen Description BLOOD UNKO  Final   Special Requests BOTTLES DRAWN AEROBIC  AND ANAEROBIC  3CC  Final   Culture NO GROWTH 5 DAYS  Final   Report Status 08/17/2015 FINAL  Final  Blood Culture (routine x 2)     Status: None   Collection Time: 2015-08-26  9:20 AM  Result Value Ref Range Status   Specimen Description BLOOD LEFT ARM  Final   Special Requests BOTTLES DRAWN AEROBIC AND ANAEROBIC  1CC  Final   Culture NO GROWTH 5 DAYS  Final   Report Status 08/17/2015 FINAL  Final  Wound culture     Status: None   Collection Time: 2015/08/26  9:20 AM  Result Value Ref Range Status   Specimen Description DECUBITIS  Final   Special Requests Normal  Final   Gram Stain   Final    FEW WBC SEEN MANY GRAM NEGATIVE RODS RARE GRAM POSITIVE COCCI    Culture   Final    HEAVY GROWTH ESCHERICHIA COLI MODERATE GROWTH ENTEROBACTER AEROGENES PROTEUS MIRABILIS HEAVY GROWTH ENTEROCOCCUS SPECIES VRE HAVE INTRINSIC RESISTANCE TO MOST COMMONLY USED ANTIBIOTICS AND THE ABILITY TO ACQUIRE RESISTANCE TO MOST AVAILABLE ANTIBIOTICS.    Report Status 08/16/2015 FINAL  Final   Organism ID, Bacteria ESCHERICHIA COLI  Final   Organism ID, Bacteria ENTEROBACTER AEROGENES  Final   Organism ID, Bacteria PROTEUS MIRABILIS  Final   Organism ID, Bacteria ENTEROCOCCUS SPECIES  Final      Susceptibility   Enterobacter aerogenes - MIC*    CEFTAZIDIME <=1 SENSITIVE Sensitive     CEFAZOLIN >=64 RESISTANT Resistant     CEFTRIAXONE <=1 SENSITIVE Sensitive     CIPROFLOXACIN <=0.25 SENSITIVE Sensitive     GENTAMICIN <=1 SENSITIVE Sensitive     IMIPENEM 1 SENSITIVE Sensitive     TRIMETH/SULFA <=20 SENSITIVE Sensitive     * MODERATE GROWTH ENTEROBACTER AEROGENES   Escherichia coli - MIC*    AMPICILLIN <=2 SENSITIVE Sensitive     CEFTAZIDIME <=1 SENSITIVE Sensitive     CEFAZOLIN <=4 SENSITIVE Sensitive     CEFTRIAXONE <=1 SENSITIVE Sensitive     CIPROFLOXACIN <=0.25 SENSITIVE Sensitive     GENTAMICIN <=1 SENSITIVE Sensitive     IMIPENEM <=0.25 SENSITIVE Sensitive     TRIMETH/SULFA <=20 SENSITIVE  Sensitive     * HEAVY GROWTH ESCHERICHIA COLI   Proteus mirabilis - MIC*    AMPICILLIN >=32 RESISTANT Resistant     CEFTAZIDIME <=1 SENSITIVE Sensitive     CEFAZOLIN 8 SENSITIVE Sensitive     CEFTRIAXONE <=1 SENSITIVE Sensitive     CIPROFLOXACIN <=0.25 SENSITIVE Sensitive     GENTAMICIN <=1 SENSITIVE Sensitive     IMIPENEM 1 SENSITIVE Sensitive     TRIMETH/SULFA <=20 SENSITIVE Sensitive     * PROTEUS  MIRABILIS   Enterococcus species - MIC*    AMPICILLIN >=32 RESISTANT Resistant     VANCOMYCIN >=32 RESISTANT Resistant     GENTAMICIN SYNERGY SENSITIVE Sensitive     TETRACYCLINE Value in next row Resistant      RESISTANT>=16    * HEAVY GROWTH ENTEROCOCCUS SPECIES  MRSA PCR Screening     Status: None   Collection Time: 08/02/2015  2:38 PM  Result Value Ref Range Status   MRSA by PCR NEGATIVE NEGATIVE Final    Comment:        The GeneXpert MRSA Assay (FDA approved for NASAL specimens only), is one component of a comprehensive MRSA colonization surveillance program. It is not intended to diagnose MRSA infection nor to guide or monitor treatment for MRSA infections.   Blood culture (single)     Status: None   Collection Time: 08/18/2015  3:36 PM  Result Value Ref Range Status   Specimen Description BLOOD RIGHT ASSIST CONTROL  Final   Special Requests BOTTLES DRAWN AEROBIC AND ANAEROBIC  Final   Culture NO GROWTH 5 DAYS  Final   Report Status 08/17/2015 FINAL  Final  Wound culture     Status: None   Collection Time: 08/13/15 12:37 PM  Result Value Ref Range Status   Specimen Description WOUND  Final   Special Requests Normal  Final   Gram Stain   Final    FEW WBC SEEN TOO NUMEROUS TO COUNT GRAM NEGATIVE RODS FEW GRAM POSITIVE COCCI    Culture   Final    HEAVY GROWTH ESCHERICHIA COLI MODERATE GROWTH PROTEUS MIRABILIS LIGHT GROWTH KLEBSIELLA PNEUMONIAE MODERATE GROWTH ENTEROCOCCUS GALLINARUM CRITICAL RESULT CALLED TO, READ BACK BY AND VERIFIED WITH: [facility] BORBA AT 1042  08/16/15 DV    Report Status 08/17/2015 FINAL  Final   Organism ID, Bacteria ESCHERICHIA COLI  Final   Organism ID, Bacteria PROTEUS MIRABILIS  Final   Organism ID, Bacteria KLEBSIELLA PNEUMONIAE  Final   Organism ID, Bacteria ENTEROCOCCUS GALLINARUM  Final      Susceptibility   Escherichia coli - MIC*    AMPICILLIN >=32 RESISTANT Resistant     CEFTAZIDIME 4 RESISTANT Resistant     CEFAZOLIN >=64 RESISTANT Resistant     CEFTRIAXONE 16 RESISTANT Resistant     GENTAMICIN 2 SENSITIVE Sensitive     IMIPENEM >=16 RESISTANT Resistant     TRIMETH/SULFA <=20 SENSITIVE Sensitive     Extended ESBL POSITIVE Resistant     PIP/TAZO Value in next row Resistant      RESISTANT>=128    CIPROFLOXACIN Value in next row Sensitive      SENSITIVE<=0.25    * HEAVY GROWTH ESCHERICHIA COLI   Klebsiella pneumoniae - MIC*    AMPICILLIN Value in next row Resistant      SENSITIVE<=0.25    CEFTAZIDIME Value in next row Resistant      SENSITIVE<=0.25    CEFAZOLIN Value in next row Resistant      SENSITIVE<=0.25    CEFTRIAXONE Value in next row Resistant      SENSITIVE<=0.25    CIPROFLOXACIN Value in next row Resistant      SENSITIVE<=0.25    GENTAMICIN Value in next row Sensitive      SENSITIVE<=0.25    IMIPENEM Value in next row Resistant      SENSITIVE<=0.25    TRIMETH/SULFA Value in next row Resistant      SENSITIVE<=0.25    PIP/TAZO Value in next row Resistant  RESISTANT>=128    * LIGHT GROWTH KLEBSIELLA PNEUMONIAE   Proteus mirabilis - MIC*    AMPICILLIN Value in next row Resistant      RESISTANT>=128    CEFTAZIDIME Value in next row Sensitive      RESISTANT>=128    CEFAZOLIN Value in next row Sensitive      RESISTANT>=128    CEFTRIAXONE Value in next row Sensitive      RESISTANT>=128    CIPROFLOXACIN Value in next row Sensitive      RESISTANT>=128    GENTAMICIN Value in next row Sensitive      RESISTANT>=128    IMIPENEM Value in next row Sensitive      RESISTANT>=128     TRIMETH/SULFA Value in next row Sensitive      RESISTANT>=128    PIP/TAZO Value in next row Sensitive      SENSITIVE<=4    * MODERATE GROWTH PROTEUS MIRABILIS   Enterococcus gallinarum - MIC*    AMPICILLIN Value in next row Resistant      SENSITIVE<=4    GENTAMICIN SYNERGY Value in next row Sensitive      SENSITIVE<=4    CIPROFLOXACIN Value in next row Resistant      RESISTANT>=8    TETRACYCLINE Value in next row Resistant      RESISTANT>=16    * MODERATE GROWTH ENTEROCOCCUS GALLINARUM  Wound culture     Status: None   Collection Time: 08/15/15  2:53 PM  Result Value Ref Range Status   Specimen Description WOUND  Final   Special Requests NONE  Final   Gram Stain FEW WBC SEEN NO ORGANISMS SEEN   Final   Culture NO GROWTH 3 DAYS  Final   Report Status 08/18/2015 FINAL  Final  C difficile quick scan w PCR reflex     Status: None   Collection Time: 08/17/15 11:34 AM  Result Value Ref Range Status   C Diff antigen NEGATIVE NEGATIVE Final   C Diff toxin NEGATIVE NEGATIVE Final   C Diff interpretation Negative for C. difficile  Final    Medical History: Past Medical History  Diagnosis Date  . Obesity   . Dyslipidemia   . Hypertension   . Coronary artery disease     s/p BMS 2010 LAD  . Dysrhythmia     ventricular tachycardia resolved after LAD stent and beta blocker  . Diabetes mellitus     with retinopathy, neuropathy and microalbuminemia  . ESRD (end stage renal disease) on dialysis     Medications:  Scheduled:  . acidophilus  1 capsule Oral Daily  . albumin human  12.5 g Intravenous BID  . antiseptic oral rinse  7 mL Mouth Rinse q12n4p  . chlorhexidine  15 mL Mouth Rinse BID  . DAPTOmycin (CUBICIN)  IV  500 mg Intravenous Q48H  . epoetin (EPOGEN/PROCRIT) injection  20,000 Units Subcutaneous Weekly  . feeding supplement (PRO-STAT SUGAR FREE 64)  30 mL Per Tube 6 times per day  . free water  100 mL Per Tube 3 times per day  . hydrocortisone  10 mg Oral BID  .  insulin aspart  0-15 Units Subcutaneous 6 times per day  . magnesium sulfate 1 - 4 g bolus IVPB  2 g Intravenous Once  . meropenem (MERREM) IV  1 g Intravenous Q12H  . midodrine  10 mg Oral TID WC  . multivitamin  5 mL Per Tube Daily  . nystatin   Topical TID  . potassium phosphate IVPB (  mmol)  20 mmol Intravenous Once  . sodium hypochlorite   Irrigation BID   Infusions:  . feeding supplement (VITAL 1.5 CAL) 1,000 mL (08/20/15 2015)  . pureflow 3 each (08/20/15 2353)   PRN: sodium chloride, sodium chloride, alteplase, anticoagulant sodium citrate, lidocaine (PF), lidocaine-prilocaine, pentafluoroprop-tetrafluoroeth  Assessment: 49 y/o F with ESRD on HD with sepsis likely from stage IV sacral decubitus with wound growing multiple MDR organisms including VRE. Patient is on day 8 of antibiotics (day 5 of daptomycin and day 9 of meropenem).  Goal of Therapy:  Resolution of infection  Plan:   Patient converted to CRRT. Will continue daptomycin 6 mg/kg q 48 hours and meropenem 1000 mg IV Q12 hours.   Tianne Plott L 08/21/2015,2:23 PM

## 2015-08-21 NOTE — Progress Notes (Signed)
Spoke with Dr Katrinka Blazing Neurology and requesting re-evaluation about her mental status. Husband requesting MRI brain and Have told him will defer to neurology.

## 2015-08-21 NOTE — Progress Notes (Signed)
  Addendum: I was contacted by the patient's nurse regarding the patient's husband's request that the patient be evaluated immediately by neurology. The patient does not have an acute medical emergency at this time and it would be appropriate for the neurology service to respond to this routine consult in the usual timeframe at their discretion. Should the husband have continued concerns, I would be appropriate to escalate his concerns to the unit manager or charge nurse per usual policy.

## 2015-08-21 NOTE — Progress Notes (Signed)
Medical workup continues with patient. CSW will continue to follow patient and assist with ongoing and discharge needs.   Sammuel Hines. Theresia Majors, MSW Clinical Social Work Department Emergency Room [phone] 10:39 AM

## 2015-08-21 NOTE — Progress Notes (Signed)
   08/21/15 1000  Clinical Encounter Type  Visited With Patient;Family  Visit Type Initial;Follow-up  Referral From Nurse  Spiritual Encounters  Spiritual Needs Emotional  Stress Factors  Patient Stress Factors Health changes  Chaplain Maisie Fus engaged husband of patient. Worked to build relationship with support and prayers. Marland Kitchen

## 2015-08-21 NOTE — Progress Notes (Signed)
ELECTROLYTE MANAGEMENT - INITIAL  Pharmacy Consult for Electrolyte Management Indication: hypokalemia  Allergies  Allergen Reactions  . Contrast Media [Iodinated Diagnostic Agents] Anaphylaxis  . Ampicillin Rash    Patient Measurements: Height:  (175.3 cm) Weight: 177 lb 0.5 oz (80.3 kg) IBW/kg (Calculated) : 66.2   Vital Signs: Temp: 98.1 F (36.7 C) (10/02 1400) Temp Source: Rectal (10/02 0900) BP: 118/66 mmHg (10/02 1400) Pulse Rate: 87 (10/02 1400) Intake/Output from previous day: 10/01 0701 - 10/02 0700 In: 1396.3 [I.V.:230; NG/GT:756.3; IV Piggyback:410] Out: 1822 [Stool:676] Intake/Output from this shift: Total I/O In: 485 [I.V.:60; NG/GT:275; IV Piggyback:150] Out: 434 [Other:434]  Labs:  Recent Labs  08/19/15 0548 08/21/15 0417 08/21/15 0839  WBC 9.1 11.0  --   HGB 8.4* 8.4*  --   HCT 26.3* 25.9*  --   PLT 34* 60*  --   APTT  --   --  39*     Recent Labs  08/20/15 1934 08/20/15 2350 08/21/15 0417 08/21/15 0839 08/21/15 0843 08/21/15 1137  NA 141 140  --  139  --   --   K 3.8 3.9  --  3.7  --   --   CL 104 104  --  106  --   --   CO2 32 32  --  32  --   --   GLUCOSE 132* 124*  --  155*  --   --   BUN 27* 26*  --  27*  --   --   CREATININE 0.77 0.78  --  0.61  --   --   CALCIUM 8.0* 8.0*  --  8.2*  --   --   MG 1.7  --  1.6*  --  1.6* 1.6*  PHOS 1.7* 1.7*  --  1.7*  --   --   ALBUMIN 1.9* 2.1*  --  2.1*  --   --    Estimated Creatinine Clearance: 95.4 mL/min (by C-G formula based on Cr of 0.61).    Recent Labs  08/21/15 0400 08/21/15 0810 08/21/15 1156  GLUCAP 98 143* 130*    Medical History: Past Medical History  Diagnosis Date  . Obesity   . Dyslipidemia   . Hypertension   . Coronary artery disease     s/p BMS 2010 LAD  . Dysrhythmia     ventricular tachycardia resolved after LAD stent and beta blocker  . Diabetes mellitus     with retinopathy, neuropathy and microalbuminemia  . ESRD (end stage renal disease) on  dialysis     Medications:  Scheduled:  . acidophilus  1 capsule Oral Daily  . albumin human  12.5 g Intravenous BID  . antiseptic oral rinse  7 mL Mouth Rinse q12n4p  . chlorhexidine  15 mL Mouth Rinse BID  . DAPTOmycin (CUBICIN)  IV  500 mg Intravenous Q48H  . epoetin (EPOGEN/PROCRIT) injection  20,000 Units Subcutaneous Weekly  . feeding supplement (PRO-STAT SUGAR FREE 64)  30 mL Per Tube 6 times per day  . free water  100 mL Per Tube 3 times per day  . hydrocortisone  10 mg Oral BID  . insulin aspart  0-15 Units Subcutaneous 6 times per day  . magnesium sulfate 1 - 4 g bolus IVPB  2 g Intravenous Once  . meropenem (MERREM) IV  1 g Intravenous Q12H  . midodrine  10 mg Oral TID WC  . multivitamin  5 mL Per Tube Daily  . nystatin   Topical  TID  . potassium phosphate IVPB (mmol)  20 mmol Intravenous Once  . sodium hypochlorite   Irrigation BID    Assessment: 49 yo female admitted for sepsis, found to have hypokalemia. Pharmacy consulted for electrolyte management.  Plan:  Will order magnesium 2g IV x1 and potassium phosphate IV x 1.   Pharmacy will continue to monitor and adjust per consult.    Simpson,Michael L, Pharm.D. Clinical Pharmacist 08/21/2015,2:24 PM

## 2015-08-21 NOTE — Progress Notes (Signed)
Notified Dr. Allena Katz pts. husband very concerned about pts lethargy and inability to follow commands.  He would like an MRI of the brain and Ct of head to be ordered.  Telephone orders given by Dr. Allena Katz to place order for MRI of Brain without contrast and if pt is unable to get an MRI of brain may place orders for Ct of head

## 2015-08-21 NOTE — Progress Notes (Signed)
Pt husband has questions and concerns regarding neuro consult he spoke with Dr. Allena Katz about this morning. called and S/w dr Allena Katz, she stated she spoke with Dr Katrinka Blazing (neuro) and requested a re-eval. Neuro should see pt within 24 hours. Dr Katrinka Blazing is aware of pt. No new interventions at this time, I will follow up with Dr Katrinka Blazing.

## 2015-08-21 NOTE — Progress Notes (Addendum)
[name] [place] at [name]   PATIENT NAME: Courtney [name]    MR#:  [id number]  DATE OF BIRTH:  1966/08/19  SUBJECTIVE:  Patient is encephalopathic,sometimes opens her eyes to verbal commands with her husband in the room but not communicating.  pt on tube feeds, flexiseal  Husband at bedside  REVIEW OF SYSTEMS:  Unable to obtain review of systems DRUG ALLERGIES:   Allergies  Allergen Reactions  . Contrast Media [Iodinated Diagnostic Agents] Anaphylaxis  . Ampicillin Rash    VITALS:  Blood pressure 147/71, pulse 91, temperature 95.2 F (35.1 C), temperature source Rectal, resp. rate 16, height  (1.753 m), weight 80.3 kg (177 lb 0.5 oz), SpO2 100 %.  PHYSICAL EXAMINATION:  GENERAL:  49 y.o.-year-old patient lying in the bed with no acute distress. Chronically ill, pallor+ EYES: Pupils equal, round, sluggishly reactive to light and accommodation No scleral icterus. HEENT:  Normocephalic. Oropharynx and nasopharynx clear. [name] site is intact. NG+ NECK:  Supple, no jugular venous distention. No thyroid enlargement, no tenderness.  LUNGS: Moderate air entry, decreased breath sounds at the bases, no wheezing, rales,rhonchi or crepitation. No use of accessory muscles of respiration.  CARDIOVASCULAR: S1, S2 normal. No murmurs, rubs, or gallops.  ABDOMEN: Soft, nondistended. Bowel sounds present.  EXTREMITIES: peripheral  edema, no cyanosis, or clubbing.  NEUROLOGIC: Patient is with altered mental status  PSYCHIATRIC: unable to assess SKIN: Large sacral decubitus ulcer .   Peripheral edema is present   LABORATORY PANEL:   CBC  Recent Labs Lab 08/21/15 0417  WBC 11.0  HGB 8.4*  HCT 25.9*  PLT 60*   ------------------------------------------------------------------------------------------------------------------  Chemistries   Recent Labs Lab 08/18/15 0507  08/20/15 2350  08/21/15 0843  NA 140  < > 140  --   --   K 4.0   < > 3.9  --   --   CL 104  < > 104  --   --   CO2 32  < > 32  --   --   GLUCOSE 136*  < > 124*  --   --   BUN 31*  < > 26*  --   --   CREATININE 1.24*  < > 0.78  --   --   CALCIUM 7.6*  < > 8.0*  --   --   MG  --   < >  --   < > 1.6*  AST 23  --   --   --   --   ALT 22  --   --   --   --   ALKPHOS 384*  --   --   --   --   BILITOT 1.0  --   --   --   --   < > = values in this interval not displayed. ------------------------------------------------------------------------------------------------------------------  Cardiac Enzymes No results for input(s): TROPONINI in the last 168 hours. ------------------------------------------------------------------------------------------------------------------  RADIOLOGY:  No results found.  EKG:   Orders placed or performed in visit on 2015/08/23  . EKG 12-Lead  . EKG 12-Lead  . EKG 12-Lead    ASSESSMENT AND PLAN:   1. Sepsis . Probably from stage IV foul-smelling sacral decubiti with multiple drug resistant organisms -on Daptomycin and meropenem - blood Culture with no growth so far -Wound culture with Escherichia coli, Proteus mirabilis, Klebsiella pneumonia and enterococcus gallinarum -off  levophed as blood pressure is better. Hold all antihypertensives medication -Appreciate Critical care/ID recommendations.  2. Acute encephalopathy- likely from problem #1 Continue current antibiotics daptomycin and meropenem Initial CT scan was negative.  Patient has no history of seizure. No witnessed seizures Status post EEG -negative for seizures Consider CT head vs  MRI Brain(which ever feasible)  Will get Neurology to see pt again given not much improvement in mentation.  3. symptomatic anemia-   status post1 unit of blood transfusion and hemoglobin is at 8.4  4. Thrombocytopenia- no acute bleeding. Platelet count is at 60,000 today. -was on heparin subcutaneous  Transfuse platelets if platelet count is less than or equal to  10,000 HIT negative Oncology recommendations are appreciated  5. End-stage renal disease on hemodialysis -now on CRRT  6. Elevated troponin likely demand ischemia with septic shock  7. Stage IV sacral decubitus- status post bedside wound debridement. Appreciate surgery recommendations. Dr Excell Seltzer is recommending plastic surgery eval and given pt-s chronic co-morbidities the sacral ulcers has less chances to heal  8. Hypokalemia and hypomagnesemia- replace potassium and magnesium IV.   9. Severe protein energy malnutrition - tube feeds with vital decreased to 30 mL per hour as per husband's request  10. Diarrhea-can be antibiotic induced as well as from tube feeds-flexi seal.  - on probiotic   All the records are reviewed and case discussed with Care Management/Social Workerr. I have discussed the plan of care at length with patient's husband  [name] Not considering transfer at this time today. He is aware that patient is severely malnourished, has very low platelet count is at high risk for bleeding and plastic surgery services are unavailable at [facility].  CODE STATUS: Full code  TOTAL CRITICAL CARE TIME TAKING CARE OF THIS PATIENT: 45 minutes.  Greater than 50% time was spent on face-to-face counseling, discussing with specialists and coordination of care   [name] M.D on 08/21/2015 at 11:04 AM  Between 7am to 6pm - Pager - [phone] After 6pm go to [email or web address] - password [email or web address] [name]  [facility]  Office  [phone]  CC: Primary care physician; Courtney Rubenstein, MD

## 2015-08-21 NOTE — Progress Notes (Signed)
Rested on and off during the night. Follows commands at times. NSR and VSS. Afebrile. Anuric. CRRT running well. Husband at bedside throughout shift.

## 2015-08-21 NOTE — Consult Note (Signed)
Reason for Consult:  unresponsive Referring Physician: Dr. Hilbert Odor Courtney [name] is an 49 y.o. female.  HPI:  Seen at request of Dr. Posey Pronto for re-consult due to unresponsiveness.   49 yo RHD F with multiple medical problems presents back to [facility] due to unresponsiveness.  At that time she would only respond to sternal rub, husband states that she has done this multiple times in the past and when her white count got close to normal then she would start talking and following.  Now it has been a few days that he expected her to talk and she had not so Neurology was called today.  He notes that she was not moving the R side as much either.  He does report that there was a question of possible endocarditis before when they just treated her with antibiotics and he believed that this issue had been resolved.  He states that she was taken off heparin but was on it previously.  Past Medical History  Diagnosis Date  . Obesity   . Dyslipidemia   . Hypertension   . Coronary artery disease     s/p BMS 2010 LAD  . Dysrhythmia     ventricular tachycardia resolved after LAD stent and beta blocker  . Diabetes mellitus     with retinopathy, neuropathy and microalbuminemia  . ESRD (end stage renal disease) on dialysis     Past Surgical History  Procedure Laterality Date  . Cholecystectomy  1990  . Pci lad  12/2010  . Colonoscopy  08/2011  . Left heart catheterization with coronary angiogram N/A 01/28/2012    Procedure: LEFT HEART CATHETERIZATION WITH CORONARY ANGIOGRAM;  Surgeon: Sinclair Grooms, MD;  Location: [facility] CATH LAB;  Service: Cardiovascular;  Laterality: N/A;  . Tracheostomy    . Gastric bypass      Family History  Problem Relation Age of Onset  . Hypertension Father   . Diabetes Father   . Cancer Mother   . Diabetes Paternal Grandfather   . Hypertension Paternal Grandfather   . Diabetes Paternal Grandmother   . Hypertension Paternal Grandmother   . Cancer Maternal Grandmother     colon ca    Social History:  reports that she has never smoked. She has never used smokeless tobacco. She reports that she does not drink alcohol or use illicit drugs.  Allergies:  Allergies  Allergen Reactions  . Contrast Media [Iodinated Diagnostic Agents] Anaphylaxis  . Ampicillin Rash    Medications: personally reviewed by me   Results for orders placed or performed during the hospital encounter of 08/10/2015 (from the past 48 hour(s))  Glucose, capillary     Status: Abnormal   Collection Time: 08/19/15  9:50 PM  Result Value Ref Range   Glucose-Capillary 116 (H) 65 - 99 mg/dL  Glucose, capillary     Status: Abnormal   Collection Time: 08/20/15 12:30 AM  Result Value Ref Range   Glucose-Capillary 127 (H) 65 - 99 mg/dL  Basic metabolic panel     Status: Abnormal   Collection Time: 08/20/15  4:22 AM  Result Value Ref Range   Sodium 141 135 - 145 mmol/L   Potassium 3.8 3.5 - 5.1 mmol/L   Chloride 104 101 - 111 mmol/L   CO2 33 (H) 22 - 32 mmol/L   Glucose, Bld 101 (H) 65 - 99 mg/dL   BUN 21 (H) 6 - 20 mg/dL   Creatinine, Ser 0.72 0.44 - 1.00 mg/dL   Calcium 7.4 (  L) 8.9 - 10.3 mg/dL   GFR calc non Af Amer >60 >60 mL/min   GFR calc Af Amer >60 >60 mL/min    Comment: (NOTE) The eGFR has been calculated using the CKD EPI equation. This calculation has not been validated in all clinical situations. eGFR's persistently <60 mL/min signify possible Chronic Kidney Disease.    Anion gap 4 (L) 5 - 15  Glucose, capillary     Status: None   Collection Time: 08/20/15  4:31 AM  Result Value Ref Range   Glucose-Capillary 97 65 - 99 mg/dL  Glucose, capillary     Status: Abnormal   Collection Time: 08/20/15  7:48 AM  Result Value Ref Range   Glucose-Capillary 114 (H) 65 - 99 mg/dL  Glucose, capillary     Status: Abnormal   Collection Time: 08/20/15 11:31 AM  Result Value Ref Range   Glucose-Capillary 114 (H) 65 - 99 mg/dL  Glucose, capillary     Status: Abnormal   Collection  Time: 08/20/15  4:08 PM  Result Value Ref Range   Glucose-Capillary 123 (H) 65 - 99 mg/dL  Renal function     Status: Abnormal   Collection Time: 08/20/15  7:34 PM  Result Value Ref Range   Sodium 141 135 - 145 mmol/L   Potassium 3.8 3.5 - 5.1 mmol/L   Chloride 104 101 - 111 mmol/L   CO2 32 22 - 32 mmol/L   Glucose, Bld 132 (H) 65 - 99 mg/dL   BUN 27 (H) 6 - 20 mg/dL   Creatinine, Ser 0.77 0.44 - 1.00 mg/dL   Calcium 8.0 (L) 8.9 - 10.3 mg/dL   Phosphorus 1.7 (L) 2.5 - 4.6 mg/dL   Albumin 1.9 (L) 3.5 - 5.0 g/dL   GFR calc non Af Amer >60 >60 mL/min   GFR calc Af Amer >60 >60 mL/min    Comment: (NOTE) The eGFR has been calculated using the CKD EPI equation. This calculation has not been validated in all clinical situations. eGFR's persistently <60 mL/min signify possible Chronic Kidney Disease.    Anion gap 5 5 - 15  Magnesium     Status: None   Collection Time: 08/20/15  7:34 PM  Result Value Ref Range   Magnesium 1.7 1.7 - 2.4 mg/dL  Glucose, capillary     Status: Abnormal   Collection Time: 08/20/15  7:41 PM  Result Value Ref Range   Glucose-Capillary 120 (H) 65 - 99 mg/dL  Glucose, capillary     Status: Abnormal   Collection Time: 08/20/15 11:33 PM  Result Value Ref Range   Glucose-Capillary 122 (H) 65 - 99 mg/dL  Renal function     Status: Abnormal   Collection Time: 08/20/15 11:50 PM  Result Value Ref Range   Sodium 140 135 - 145 mmol/L   Potassium 3.9 3.5 - 5.1 mmol/L   Chloride 104 101 - 111 mmol/L   CO2 32 22 - 32 mmol/L   Glucose, Bld 124 (H) 65 - 99 mg/dL   BUN 26 (H) 6 - 20 mg/dL   Creatinine, Ser 0.78 0.44 - 1.00 mg/dL   Calcium 8.0 (L) 8.9 - 10.3 mg/dL   Phosphorus 1.7 (L) 2.5 - 4.6 mg/dL   Albumin 2.1 (L) 3.5 - 5.0 g/dL   GFR calc non Af Amer >60 >60 mL/min   GFR calc Af Amer >60 >60 mL/min    Comment: (NOTE) The eGFR has been calculated using the CKD EPI equation. This calculation has not been validated  in all clinical situations. eGFR's  persistently <60 mL/min signify possible Chronic Kidney Disease.    Anion gap 4 (L) 5 - 15  Glucose, capillary     Status: None   Collection Time: 08/21/15  4:00 AM  Result Value Ref Range   Glucose-Capillary 98 65 - 99 mg/dL  Magnesium     Status: Abnormal   Collection Time: 08/21/15  4:17 AM  Result Value Ref Range   Magnesium 1.6 (L) 1.7 - 2.4 mg/dL  CBC with Differential/Platelet     Status: Abnormal   Collection Time: 08/21/15  4:17 AM  Result Value Ref Range   WBC 11.0 3.6 - 11.0 K/uL   RBC 2.74 (L) 3.80 - 5.20 MIL/uL   Hemoglobin 8.4 (L) 12.0 - 16.0 g/dL   HCT 25.9 (L) 35.0 - 47.0 %   MCV 94.2 80.0 - 100.0 fL   MCH 30.4 26.0 - 34.0 pg   MCHC 32.3 32.0 - 36.0 g/dL   RDW 16.7 (H) 11.5 - 14.5 %   Platelets 60 (L) 150 - 440 K/uL   Neutrophils Relative % 84% %   Neutro Abs 9.2 (H) 1.4 - 6.5 K/uL   Lymphocytes Relative 10% %   Lymphs Abs 1.1 1.0 - 3.6 K/uL   Monocytes Relative 6% %   Monocytes Absolute 0.6 0.2 - 0.9 K/uL   Eosinophils Relative 0% %   Eosinophils Absolute 0.0 0 - 0.7 K/uL   Basophils Relative 0% %   Basophils Absolute 0.0 0 - 0.1 K/uL  Glucose, capillary     Status: Abnormal   Collection Time: 08/21/15  8:10 AM  Result Value Ref Range   Glucose-Capillary 143 (H) 65 - 99 mg/dL   Comment 1 Notify RN   Renal function     Status: Abnormal   Collection Time: 08/21/15  8:39 AM  Result Value Ref Range   Sodium 139 135 - 145 mmol/L   Potassium 3.7 3.5 - 5.1 mmol/L   Chloride 106 101 - 111 mmol/L   CO2 32 22 - 32 mmol/L   Glucose, Bld 155 (H) 65 - 99 mg/dL   BUN 27 (H) 6 - 20 mg/dL   Creatinine, Ser 0.61 0.44 - 1.00 mg/dL   Calcium 8.2 (L) 8.9 - 10.3 mg/dL   Phosphorus 1.7 (L) 2.5 - 4.6 mg/dL   Albumin 2.1 (L) 3.5 - 5.0 g/dL   GFR calc non Af Amer >60 >60 mL/min   GFR calc Af Amer >60 >60 mL/min    Comment: (NOTE) The eGFR has been calculated using the CKD EPI equation. This calculation has not been validated in all clinical situations. eGFR's  persistently <60 mL/min signify possible Chronic Kidney Disease.    Anion gap 1 (L) 5 - 15  APTT     Status: Abnormal   Collection Time: 08/21/15  8:39 AM  Result Value Ref Range   aPTT 39 (H) 24 - 36 seconds    Comment:        IF BASELINE aPTT IS ELEVATED, SUGGEST PATIENT RISK ASSESSMENT BE USED TO DETERMINE APPROPRIATE ANTICOAGULANT THERAPY.   Magnesium     Status: Abnormal   Collection Time: 08/21/15  8:43 AM  Result Value Ref Range   Magnesium 1.6 (L) 1.7 - 2.4 mg/dL  Renal function     Status: Abnormal   Collection Time: 08/21/15 11:37 AM  Result Value Ref Range   Sodium 140 135 - 145 mmol/L   Potassium 3.6 3.5 - 5.1 mmol/L   Chloride  106 101 - 111 mmol/L   CO2 29 22 - 32 mmol/L   Glucose, Bld 130 (H) 65 - 99 mg/dL   BUN 25 (H) 6 - 20 mg/dL   Creatinine, Ser 0.71 0.44 - 1.00 mg/dL   Calcium 8.1 (L) 8.9 - 10.3 mg/dL   Phosphorus 1.2 (L) 2.5 - 4.6 mg/dL   Albumin 2.2 (L) 3.5 - 5.0 g/dL   GFR calc non Af Amer >60 >60 mL/min   GFR calc Af Amer >60 >60 mL/min    Comment: (NOTE) The eGFR has been calculated using the CKD EPI equation. This calculation has not been validated in all clinical situations. eGFR's persistently <60 mL/min signify possible Chronic Kidney Disease.    Anion gap 5 5 - 15  Magnesium     Status: Abnormal   Collection Time: 08/21/15 11:37 AM  Result Value Ref Range   Magnesium 1.6 (L) 1.7 - 2.4 mg/dL  Glucose, capillary     Status: Abnormal   Collection Time: 08/21/15 11:56 AM  Result Value Ref Range   Glucose-Capillary 130 (H) 65 - 99 mg/dL   Comment 1 Notify RN   Glucose, capillary     Status: None   Collection Time: 08/21/15  5:28 PM  Result Value Ref Range   Glucose-Capillary 77 65 - 99 mg/dL   Comment 1 Notify RN   Magnesium     Status: None   Collection Time: 08/21/15  5:48 PM  Result Value Ref Range   Magnesium 2.0 1.7 - 2.4 mg/dL    Mr Brain Wo Contrast  08/21/2015   CLINICAL DATA:  49 year old hypertensive diabetic female with  end-stage renal disease on dialysis presenting with altered mental status. Hypotensive and 07/28/2015. Subsequent encounter.  EXAM: MRI HEAD WITHOUT CONTRAST  TECHNIQUE: Multiplanar, multiecho pulse sequences of the brain and surrounding structures were obtained without intravenous contrast.  COMPARISON:  07/30/2015 head CT.  01/28/2012 brain MR.  FINDINGS: Exam is significantly motion degraded.  Multiple acute infarcts. The largest infarct is within the left frontal lobe. Smaller infarct left parietal lobe. Scattered small infarcts noted in a parasagittal distribution right frontal -parietal lobe. Small infarcts occipital lobes and cerebellum bilaterally.  No intracranial hemorrhage.  No intracranial mass lesion noted on this unenhanced exam. (Evaluation limited by motion).  Global atrophy without hydrocephalus.  Limited evaluation of intracranial vasculature given the degree of motion.  Osseous changes of anemia.  Mild spinal stenosis C3-4 and C4-5. Cervical medullary junction unremarkable. Small pituitary gland felt to be incidental finding.  IMPRESSION: Exam is significantly motion degraded.  Multiple acute infarcts. The largest infarct is within the left frontal lobe. Smaller infarct left parietal lobe. Scattered small infarcts noted in a parasagittal distribution right frontal -parietal lobe. Small infarcts occipital lobes and cerebellum bilaterally.  No intracranial hemorrhage.  Global atrophy without hydrocephalus.  Limited evaluation of intracranial vasculature given the degree of motion.  These results will be called to the ordering clinician or representative by the Radiologist Assistant, and communication documented in the PACS or zVision Dashboard.   Electronically Signed   By: Genia Del M.D.   On: 08/21/2015 16:56    Review of Systems  Unable to perform ROS: medical condition   Blood pressure 136/69, pulse 100, temperature 98.7 F (37.1 C), temperature source Oral, resp. rate 28, height 5'  9" (1.753 m), weight 80.3 kg (177 lb 0.5 oz), SpO2 100 %. Physical Exam  Nursing note and vitals reviewed. Constitutional: She appears well-developed and well-nourished.  She appears distressed.  HENT:  Head: Normocephalic and atraumatic.  Right Ear: External ear normal.  Left Ear: External ear normal.  Nose: Nose normal.  Mouth/Throat: Oropharynx is clear and moist.  Eyes: Conjunctivae and EOM are normal. Pupils are equal, round, and reactive to light.  Neck: Normal range of motion. Neck supple.  Cardiovascular: Normal rate, regular rhythm, normal heart sounds and intact distal pulses.   Respiratory: Effort normal and breath sounds normal.  GI: Soft. Bowel sounds are normal.  Neurological:  Trached, not sedated, does not open or follow, GCS 7T Pupils 25mm B and sluggish, weak R corneal, good L corneal, decent gag Localizes L UE, trace movement of R UE, no movement of B LE, decreased tone 0/4 B, mute plantars Grimaces to pain centrally and on B UE  Skin: She is not diaphoretic.    Assessment/Plan: 1.  Multiple embolic acute infarcts-  Concern for cardioembolic source as cause particularly endocarditis;  Can not rule DIC as cause either with low platelets too;  This will leave permanent deficits such as Brocas aphasia and R hemiparesis which will have long term effects on patients quality of life. 2.  Encephalopathy-  Still moderate, could be from infections and metabolic issues -  EEG -  Echo very important and pending read, if neg would suggest TEE -  Will check DIC labs -  Ok to start ASA $RemoveB'300mg'[id number]$  PR -  Place on stroke protocol with all therapy consults and labs -  Avoid heparin and other anticoagulants now -  Recommend ID and Hem/Onc opinions as well -  Will follow closely with you and plan discussed with husband at [facility], [place] 08/21/2015, 8:02 PM

## 2015-08-21 NOTE — Progress Notes (Signed)
Notified Dr. Allena Katz about MRI results she stated she would contact Neurologist Dr. Katrinka Blazing and Dr. Nicholos Johns

## 2015-08-21 NOTE — Progress Notes (Signed)
S/w dr. Katrinka Blazing, he is aware of neuro consult re-eval. He will see pt within 24 hours per protocol. Explained to pts husband that neruo would consult within 24 hrs. He insisted on speaking to someone who was "handling her care". At this point I called dr. Darnelle Catalan. Dr Darnelle Catalan instructed me that he would not be returning to s/w pt family, neuro has been consulted they will follow up with pt family when they evaluate the pt. He instructed me to call administration or the unit manager for future concerns regarding this pt. I spoke with icu charge nurse Annabelle Harman regarding family concerns and physicians treatment plan and care. Annabelle Harman s/w pts husband regarding concerns as well as dr patel. Dr. Allena Katz aurtherized MRI per request.

## 2015-08-21 NOTE — Progress Notes (Signed)
Reviewed results of MRI brain with pat's husband and informed that pt has had stroke.I will start her on PR ASA and watch platelets count closely. Will check Echo of the heart to look for source of emboli if any. D/w Dr Katrinka Blazing MRI brain results. HE will stop by later this evening to see the pt. CT head on admission was negative 2015/09/04.

## 2015-08-21 NOTE — Progress Notes (Signed)
Subjective:  Pt remains in CCU. Remains critically ill Tube feeds going @ 35.   Rectal tube in place Opens eyes to verbal commands, nods yes/no to some Qs CRRT started 10/1 UF @ 75 cc/hr. Tolerating well     Objective:  Vital signs in last 24 hours:  Temp:  [95.2 F (35.1 C)-99 F (37.2 C)] 95.2 F (35.1 C) (10/02 0800) Pulse Rate:  [86-114] 95 (10/02 0800) Resp:  [6-29] 6 (10/02 0800) BP: (87-149)/(43-129) 123/78 mmHg (10/02 0800) SpO2:  [94 %-100 %] 100 % (10/02 0800) FiO2 (%):  [28 %] 28 % (10/02 0430) Weight:  [80.3 kg (177 lb 0.5 oz)] 80.3 kg (177 lb 0.5 oz) (10/01 1600)  Weight change: -4.8 kg (-10 lb 9.3 oz) Filed Weights   08/19/15 0557 08/19/15 1615 08/20/15 1600  Weight: 84.4 kg (186 lb 1.1 oz) 85.1 kg (187 lb 9.8 oz) 80.3 kg (177 lb 0.5 oz)    Intake/Output: I/O last 3 completed shifts: In: 1406.3 [I.V.:240; NG/GT:756.3; IV Piggyback:410] Out: 2344 [Other:1668; Stool:676]   Intake/Output this shift:  Total I/O In: 45 [I.V.:10; NG/GT:35] Out: 74 [Other:74]  Physical Exam: General: Chronically ill appearing  Head: Normocephalic, atraumatic. Moist oral mucosal membranes  Eyes: Eyes closed  Neck: Trach in place  Lungs:  Scattered rhonchi, normal effort  Heart: S1S2 no rubs  Abdomen:  Soft, nontender, BS present  Extremities:  ++ dependent and peripheral edema, b/l feet in soft support  Neurologic: Lethargic, opens eyes to verbal commands  Skin: No lesions  Access: R IJ permcath    Basic Metabolic Panel:  Recent Labs Lab 08/15/15 0530 08/16/15 0422 08/17/15 0446  08/18/15 0939 08/19/15 1137 08/20/15 0422 08/20/15 1934 08/20/15 2350 08/21/15 0417  NA 141 142 141  < > 136 141 141 141 140  --   K 4.0 3.8 3.0*  < > 3.8 3.8 3.8 3.8 3.9  --   CL 104 105 105  < > 102 105 104 104 104  --   CO2 32 31 31  < > 31 33* 33* 32 32  --   GLUCOSE 76 234* 148*  < > 132* 162* 101* 132* 124*  --   BUN 17 25* 18  < > 31* 28* 21* 27* 26*  --   CREATININE  1.11* 1.45* 0.94  < > 1.30* 1.03* 0.72 0.77 0.78  --   CALCIUM 7.8* 7.6* 7.2*  < > 7.3* 7.7* 7.4* 8.0* 8.0*  --   MG  --  1.7 1.7  --   --   --   --  1.7  --  1.6*  PHOS 1.6* 3.4  --   --  2.0*  --   --  1.7* 1.7*  --   < > = values in this interval not displayed.  Liver Function Tests:  Recent Labs Lab 08/18/15 0507 08/18/15 0939 08/20/15 1934 08/20/15 2350  AST 23  --   --   --   ALT 22  --   --   --   ALKPHOS 384*  --   --   --   BILITOT 1.0  --   --   --   PROT 4.4*  --   --   --   ALBUMIN 1.7* 1.7* 1.9* 2.1*   No results for input(s): LIPASE, AMYLASE in the last 168 hours. No results for input(s): AMMONIA in the last 168 hours.  CBC:  Recent Labs Lab 08/14/15 1747  08/17/15 0446 08/18/15 0507 08/18/15 5366  08/19/15 0548 08/21/15 0417  WBC 14.8*  < > 6.7 7.5 7.2 9.1 11.0  NEUTROABS 14.0*  --  5.4  --   --   --  9.2*  HGB 6.8*  < > 8.3* 8.4* 8.1* 8.4* 8.4*  HCT 21.4*  < > 25.5* 26.0* 25.5* 26.3* 25.9*  MCV 92.8  < > 92.6 93.0 93.0 93.9 94.2  PLT 43*  < > 17* 20* 20* 34* 60*  < > = values in this interval not displayed.  Cardiac Enzymes: No results for input(s): CKTOTAL, CKMB, CKMBINDEX, TROPONINI in the last 168 hours.  BNP: Invalid input(s): POCBNP  CBG:  Recent Labs Lab 08/20/15 1608 08/20/15 1941 08/20/15 2333 08/21/15 0400 08/21/15 0810  GLUCAP 123* 120* 122* 98 143*    Microbiology: Results for orders placed or performed during the hospital encounter of 09-11-2015  Blood Culture (routine x 2)     Status: None   Collection Time: 2015/09/11  8:51 AM  Result Value Ref Range Status   Specimen Description BLOOD [name]  Final   Special Requests BOTTLES DRAWN AEROBIC AND ANAEROBIC  3CC  Final   Culture NO GROWTH 5 DAYS  Final   Report Status 08/17/2015 FINAL  Final  Blood Culture (routine x 2)     Status: None   Collection Time: September 11, 2015  9:20 AM  Result Value Ref Range Status   Specimen Description BLOOD LEFT ARM  Final   Special Requests BOTTLES  DRAWN AEROBIC AND ANAEROBIC  1CC  Final   Culture NO GROWTH 5 DAYS  Final   Report Status 08/17/2015 FINAL  Final  Wound culture     Status: None   Collection Time: 09-11-15  9:20 AM  Result Value Ref Range Status   Specimen Description DECUBITIS  Final   Special Requests Normal  Final   Gram Stain   Final    FEW WBC SEEN MANY GRAM NEGATIVE RODS RARE GRAM POSITIVE COCCI    Culture   Final    HEAVY GROWTH ESCHERICHIA COLI MODERATE GROWTH ENTEROBACTER AEROGENES PROTEUS MIRABILIS HEAVY GROWTH ENTEROCOCCUS SPECIES VRE HAVE INTRINSIC RESISTANCE TO MOST COMMONLY USED ANTIBIOTICS AND THE ABILITY TO ACQUIRE RESISTANCE TO MOST AVAILABLE ANTIBIOTICS.    Report Status 08/16/2015 FINAL  Final   Organism ID, Bacteria ESCHERICHIA COLI  Final   Organism ID, Bacteria ENTEROBACTER AEROGENES  Final   Organism ID, Bacteria PROTEUS MIRABILIS  Final   Organism ID, Bacteria ENTEROCOCCUS SPECIES  Final      Susceptibility   Enterobacter aerogenes - MIC*    CEFTAZIDIME <=1 SENSITIVE Sensitive     CEFAZOLIN >=64 RESISTANT Resistant     CEFTRIAXONE <=1 SENSITIVE Sensitive     CIPROFLOXACIN <=0.25 SENSITIVE Sensitive     GENTAMICIN <=1 SENSITIVE Sensitive     IMIPENEM 1 SENSITIVE Sensitive     TRIMETH/SULFA <=20 SENSITIVE Sensitive     * MODERATE GROWTH ENTEROBACTER AEROGENES   Escherichia coli - MIC*    AMPICILLIN <=2 SENSITIVE Sensitive     CEFTAZIDIME <=1 SENSITIVE Sensitive     CEFAZOLIN <=4 SENSITIVE Sensitive     CEFTRIAXONE <=1 SENSITIVE Sensitive     CIPROFLOXACIN <=0.25 SENSITIVE Sensitive     GENTAMICIN <=1 SENSITIVE Sensitive     IMIPENEM <=0.25 SENSITIVE Sensitive     TRIMETH/SULFA <=20 SENSITIVE Sensitive     * HEAVY GROWTH ESCHERICHIA COLI   Proteus mirabilis - MIC*    AMPICILLIN >=32 RESISTANT Resistant     CEFTAZIDIME <=1 SENSITIVE Sensitive  CEFAZOLIN 8 SENSITIVE Sensitive     CEFTRIAXONE <=1 SENSITIVE Sensitive     CIPROFLOXACIN <=0.25 SENSITIVE Sensitive     GENTAMICIN  <=1 SENSITIVE Sensitive     IMIPENEM 1 SENSITIVE Sensitive     TRIMETH/SULFA <=20 SENSITIVE Sensitive     * PROTEUS MIRABILIS   Enterococcus species - MIC*    AMPICILLIN >=32 RESISTANT Resistant     VANCOMYCIN >=32 RESISTANT Resistant     GENTAMICIN SYNERGY SENSITIVE Sensitive     TETRACYCLINE Value in next row Resistant      RESISTANT>=16    * HEAVY GROWTH ENTEROCOCCUS SPECIES  MRSA PCR Screening     Status: None   Collection Time: 08/06/2015  2:38 PM  Result Value Ref Range Status   MRSA by PCR NEGATIVE NEGATIVE Final    Comment:        The GeneXpert MRSA Assay (FDA approved for NASAL specimens only), is one component of a comprehensive MRSA colonization surveillance program. It is not intended to diagnose MRSA infection nor to guide or monitor treatment for MRSA infections.   Blood culture (single)     Status: None   Collection Time: 08/03/2015  3:36 PM  Result Value Ref Range Status   Specimen Description BLOOD RIGHT ASSIST CONTROL  Final   Special Requests BOTTLES DRAWN AEROBIC AND ANAEROBIC  Final   Culture NO GROWTH 5 DAYS  Final   Report Status 08/17/2015 FINAL  Final  Wound culture     Status: None   Collection Time: 08/13/15 12:37 PM  Result Value Ref Range Status   Specimen Description WOUND  Final   Special Requests Normal  Final   Gram Stain   Final    FEW WBC SEEN TOO NUMEROUS TO COUNT GRAM NEGATIVE RODS FEW GRAM POSITIVE COCCI    Culture   Final    HEAVY GROWTH ESCHERICHIA COLI MODERATE GROWTH PROTEUS MIRABILIS LIGHT GROWTH KLEBSIELLA PNEUMONIAE MODERATE GROWTH ENTEROCOCCUS GALLINARUM CRITICAL RESULT CALLED TO, READ BACK BY AND VERIFIED WITH: [facility] BORBA AT 1042 08/16/15 DV    Report Status 08/17/2015 FINAL  Final   Organism ID, Bacteria ESCHERICHIA COLI  Final   Organism ID, Bacteria PROTEUS MIRABILIS  Final   Organism ID, Bacteria KLEBSIELLA PNEUMONIAE  Final   Organism ID, Bacteria ENTEROCOCCUS GALLINARUM  Final      Susceptibility    Escherichia coli - MIC*    AMPICILLIN >=32 RESISTANT Resistant     CEFTAZIDIME 4 RESISTANT Resistant     CEFAZOLIN >=64 RESISTANT Resistant     CEFTRIAXONE 16 RESISTANT Resistant     GENTAMICIN 2 SENSITIVE Sensitive     IMIPENEM >=16 RESISTANT Resistant     TRIMETH/SULFA <=20 SENSITIVE Sensitive     Extended ESBL POSITIVE Resistant     PIP/TAZO Value in next row Resistant      RESISTANT>=128    CIPROFLOXACIN Value in next row Sensitive      SENSITIVE<=0.25    * HEAVY GROWTH ESCHERICHIA COLI   Klebsiella pneumoniae - MIC*    AMPICILLIN Value in next row Resistant      SENSITIVE<=0.25    CEFTAZIDIME Value in next row Resistant      SENSITIVE<=0.25    CEFAZOLIN Value in next row Resistant      SENSITIVE<=0.25    CEFTRIAXONE Value in next row Resistant      SENSITIVE<=0.25    CIPROFLOXACIN Value in next row Resistant      SENSITIVE<=0.25    GENTAMICIN Value in next row  Sensitive      SENSITIVE<=0.25    IMIPENEM Value in next row Resistant      SENSITIVE<=0.25    TRIMETH/SULFA Value in next row Resistant      SENSITIVE<=0.25    PIP/TAZO Value in next row Resistant      RESISTANT>=128    * LIGHT GROWTH KLEBSIELLA PNEUMONIAE   Proteus mirabilis - MIC*    AMPICILLIN Value in next row Resistant      RESISTANT>=128    CEFTAZIDIME Value in next row Sensitive      RESISTANT>=128    CEFAZOLIN Value in next row Sensitive      RESISTANT>=128    CEFTRIAXONE Value in next row Sensitive      RESISTANT>=128    CIPROFLOXACIN Value in next row Sensitive      RESISTANT>=128    GENTAMICIN Value in next row Sensitive      RESISTANT>=128    IMIPENEM Value in next row Sensitive      RESISTANT>=128    TRIMETH/SULFA Value in next row Sensitive      RESISTANT>=128    PIP/TAZO Value in next row Sensitive      SENSITIVE<=4    * MODERATE GROWTH PROTEUS MIRABILIS   Enterococcus gallinarum - MIC*    AMPICILLIN Value in next row Resistant      SENSITIVE<=4    GENTAMICIN SYNERGY Value in next  row Sensitive      SENSITIVE<=4    CIPROFLOXACIN Value in next row Resistant      RESISTANT>=8    TETRACYCLINE Value in next row Resistant      RESISTANT>=16    * MODERATE GROWTH ENTEROCOCCUS GALLINARUM  Wound culture     Status: None   Collection Time: 08/15/15  2:53 PM  Result Value Ref Range Status   Specimen Description WOUND  Final   Special Requests NONE  Final   Gram Stain FEW WBC SEEN NO ORGANISMS SEEN   Final   Culture NO GROWTH 3 DAYS  Final   Report Status 08/18/2015 FINAL  Final  C difficile quick scan w PCR reflex     Status: None   Collection Time: 08/17/15 11:34 AM  Result Value Ref Range Status   C Diff antigen NEGATIVE NEGATIVE Final   C Diff toxin NEGATIVE NEGATIVE Final   C Diff interpretation Negative for C. difficile  Final    Coagulation Studies: No results for input(s): LABPROT, INR in the last 72 hours.  Urinalysis: No results for input(s): COLORURINE, LABSPEC, PHURINE, GLUCOSEU, HGBUR, BILIRUBINUR, KETONESUR, PROTEINUR, UROBILINOGEN, NITRITE, LEUKOCYTESUR in the last 72 hours.  Invalid input(s): APPERANCEUR    Imaging: No results found.   Medications:   . feeding supplement (VITAL 1.5 CAL) 1,000 mL (08/20/15 2015)  . pureflow 3 each (08/20/15 2353)   . acidophilus  1 capsule Oral Daily  . albumin human  12.5 g Intravenous BID  . antiseptic oral rinse  7 mL Mouth Rinse q12n4p  . chlorhexidine  15 mL Mouth Rinse BID  . DAPTOmycin (CUBICIN)  IV  500 mg Intravenous Q48H  . epoetin (EPOGEN/PROCRIT) injection  20,000 Units Subcutaneous Weekly  . feeding supplement (PRO-STAT SUGAR FREE 64)  30 mL Per Tube 6 times per day  . free water  100 mL Per Tube 3 times per day  . hydrocortisone  10 mg Oral BID  . insulin aspart  0-15 Units Subcutaneous 6 times per day  . meropenem (MERREM) IV  1 g Intravenous Q12H  . midodrine  10 mg Oral TID WC  . multivitamin  5 mL Per Tube Daily  . nystatin   Topical TID  . sodium hypochlorite   Irrigation BID    sodium chloride, sodium chloride, alteplase, anticoagulant sodium citrate, lidocaine (PF), lidocaine-prilocaine, pentafluoroprop-tetrafluoroeth  Assessment/ Plan:  49 y.o. 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  1. End-stage renal disease on hemodialysis on HD TTHS. The patient has been on dialysis since October of 2014.  R IJ permcath. ,  - only 500 cc removed with HD on Friday before BP dropped - patient has large amount of [place] edema -  trial of CRRT - 4 K bath. Tolerating well so far - UF @ 75 cc/hr  2. Anemia of CKD: received blood transfusion this admission. - continue EPO  3. Secondary hyperparathyroidism: phos low at 1.7. Replaced per icu protocol  4. Septic shock: Multiple potential sources, has two catheters in place, also has a sacral decubitis, and also has trach in place.  - multiple organisms growing via wound culture - appreciate ID recommendations  5. peripheral edema - UF with HD/CRRT with iv albumin support as tolerated   6.  Thrombocytopenia:  Platelets declining, defer further work up to [employer] and hospitalist.  pack catheter with citrate  Case d/w patient's husband - John,     LOS: 9 Royalty Domagala 10/2/20169:14 AM

## 2015-08-22 ENCOUNTER — Inpatient Hospital Stay: Payer: 59

## 2015-08-22 ENCOUNTER — Inpatient Hospital Stay (HOSPITAL_COMMUNITY)
Admit: 2015-08-22 | Discharge: 2015-08-22 | Disposition: A | Discharge status: Home / Self Care | Payer: 59 | Attending: Internal Medicine | Admitting: Internal Medicine

## 2015-08-22 DIAGNOSIS — I340 Nonrheumatic mitral (valve) insufficiency: Secondary | ICD-10-CM

## 2015-08-22 LAB — PREPARE RBC (CROSSMATCH)

## 2015-08-22 LAB — RENAL FUNCTION PANEL
ALBUMIN: 2 g/dL — AB (ref 3.5–5.0)
ALBUMIN: 2.2 g/dL — AB (ref 3.5–5.0)
ANION GAP: 8 (ref 5–15)
ANION GAP: 10 (ref 5–15)
ANION GAP: 8 (ref 5–15)
Albumin: 2 g/dL — ABNORMAL LOW (ref 3.5–5.0)
Albumin: 2.4 g/dL — ABNORMAL LOW (ref 3.5–5.0)
Albumin: 2.2 g/dL — ABNORMAL LOW (ref 3.5–5.0)
Anion gap: 1 — ABNORMAL LOW (ref 5–15)
Anion gap: 3 — ABNORMAL LOW (ref 5–15)
BUN: 28 mg/dL — AB (ref 6–20)
BUN: 27 mg/dL — ABNORMAL HIGH (ref 6–20)
BUN: 26 mg/dL — ABNORMAL HIGH (ref 6–20)
BUN: 28 mg/dL — ABNORMAL HIGH (ref 6–20)
BUN: 33 mg/dL — AB (ref 6–20)
CALCIUM: 7.3 mg/dL — AB (ref 8.9–10.3)
CALCIUM: 7.6 mg/dL — AB (ref 8.9–10.3)
CALCIUM: 7.8 mg/dL — AB (ref 8.9–10.3)
CHLORIDE: 106 mmol/L (ref 101–111)
CHLORIDE: 103 mmol/L (ref 101–111)
CHLORIDE: 105 mmol/L (ref 101–111)
CO2: 30 mmol/L (ref 22–32)
CO2: 23 mmol/L (ref 22–32)
CO2: 24 mmol/L (ref 22–32)
CO2: 23 mmol/L (ref 22–32)
CO2: 27 mmol/L (ref 22–32)
CREATININE: 0.67 mg/dL (ref 0.44–1.00)
CREATININE: 0.58 mg/dL (ref 0.44–1.00)
CREATININE: 0.55 mg/dL (ref 0.44–1.00)
CREATININE: 0.62 mg/dL (ref 0.44–1.00)
Calcium: 7.8 mg/dL — ABNORMAL LOW (ref 8.9–10.3)
Calcium: 7.3 mg/dL — ABNORMAL LOW (ref 8.9–10.3)
Chloride: 102 mmol/L (ref 101–111)
Chloride: 107 mmol/L (ref 101–111)
Creatinine, Ser: 0.55 mg/dL (ref 0.44–1.00)
GFR calc Af Amer: >60 mL/min (ref >60)
GFR calc Af Amer: >60 mL/min (ref >60)
GFR calc Af Amer: >60 mL/min (ref >60)
GFR calc non Af Amer: >60 mL/min (ref >60)
GFR calc non Af Amer: >60 mL/min (ref >60)
GLUCOSE: 128 mg/dL — AB (ref 65–99)
GLUCOSE: 235 mg/dL — AB (ref 65–99)
GLUCOSE: 244 mg/dL — AB (ref 65–99)
Glucose, Bld: 206 mg/dL — ABNORMAL HIGH (ref 65–99)
Glucose, Bld: 244 mg/dL — ABNORMAL HIGH (ref 65–99)
PHOSPHORUS: 5.4 mg/dL — AB (ref 2.5–4.6)
PHOSPHORUS: 2.4 mg/dL — AB (ref 2.5–4.6)
PHOSPHORUS: 1.9 mg/dL — AB (ref 2.5–4.6)
POTASSIUM: 4.1 mmol/L (ref 3.5–5.1)
POTASSIUM: 4.3 mmol/L (ref 3.5–5.1)
POTASSIUM: 4.2 mmol/L (ref 3.5–5.1)
Phosphorus: 2.5 mg/dL (ref 2.5–4.6)
Phosphorus: 1.4 mg/dL — ABNORMAL LOW (ref 2.5–4.6)
Potassium: 4.3 mmol/L (ref 3.5–5.1)
Potassium: 6 mmol/L — ABNORMAL HIGH (ref 3.5–5.1)
SODIUM: 134 mmol/L — AB (ref 135–145)
SODIUM: 133 mmol/L — AB (ref 135–145)
SODIUM: 137 mmol/L (ref 135–145)
Sodium: 137 mmol/L (ref 135–145)
Sodium: 139 mmol/L (ref 135–145)

## 2015-08-22 LAB — COMPREHENSIVE METABOLIC PANEL
ALK PHOS: 454 U/L — AB (ref 38–126)
ALT: 19 U/L (ref 14–54)
AST: 22 U/L (ref 15–41)
Albumin: 2 g/dL — ABNORMAL LOW (ref 3.5–5.0)
Anion gap: 4 — ABNORMAL LOW (ref 5–15)
BUN: 26 mg/dL — AB (ref 6–20)
CALCIUM: 8 mg/dL — AB (ref 8.9–10.3)
CHLORIDE: 105 mmol/L (ref 101–111)
CO2: 30 mmol/L (ref 22–32)
CREATININE: 0.6 mg/dL (ref 0.44–1.00)
Glucose, Bld: 126 mg/dL — ABNORMAL HIGH (ref 65–99)
Potassium: 4.2 mmol/L (ref 3.5–5.1)
Sodium: 139 mmol/L (ref 135–145)
Total Bilirubin: 0.6 mg/dL (ref 0.3–1.2)
Total Protein: 4.6 g/dL — ABNORMAL LOW (ref 6.5–8.1)

## 2015-08-22 LAB — CBC
HCT: 21 % — ABNORMAL LOW (ref 35.0–47.0)
HEMATOCRIT: 19.5 % — AB (ref 35.0–47.0)
HEMOGLOBIN: 6.1 g/dL — AB (ref 12.0–16.0)
Hemoglobin: 6.7 g/dL — ABNORMAL LOW (ref 12.0–16.0)
MCH: 30.1 pg (ref 26.0–34.0)
MCH: 28.4 pg (ref 26.0–34.0)
MCHC: 32.1 g/dL (ref 32.0–36.0)
MCHC: 31.5 g/dL — ABNORMAL LOW (ref 32.0–36.0)
MCV: 93.9 fL (ref 80.0–100.0)
MCV: 90 fL (ref 80.0–100.0)
PLATELETS: 61 10*3/uL — AB (ref 150–440)
Platelets: 127 10*3/uL — ABNORMAL LOW (ref 150–440)
RBC: 2.24 MIL/uL — AB (ref 3.80–5.20)
RBC: 2.16 MIL/uL — ABNORMAL LOW (ref 3.80–5.20)
RDW: 16.9 % — AB (ref 11.5–14.5)
RDW: 19 % — AB (ref 11.5–14.5)
WBC: 8.5 10*3/uL (ref 3.6–11.0)
WBC: 17.5 10*3/uL — ABNORMAL HIGH (ref 3.6–11.0)

## 2015-08-22 LAB — GLUCOSE, CAPILLARY
GLUCOSE-CAPILLARY: 109 mg/dL — AB (ref 65–99)
GLUCOSE-CAPILLARY: 174 mg/dL — AB (ref 65–99)
Glucose-Capillary: 115 mg/dL — ABNORMAL HIGH (ref 65–99)
Glucose-Capillary: 115 mg/dL — ABNORMAL HIGH (ref 65–99)
Glucose-Capillary: 163 mg/dL — ABNORMAL HIGH (ref 65–99)
Glucose-Capillary: 191 mg/dL — ABNORMAL HIGH (ref 65–99)

## 2015-08-22 LAB — MAGNESIUM
MAGNESIUM: 1.6 mg/dL — AB (ref 1.7–2.4)
MAGNESIUM: 1.6 mg/dL — AB (ref 1.7–2.4)
Magnesium: 1.9 mg/dL (ref 1.7–2.4)
Magnesium: 1.8 mg/dL (ref 1.7–2.4)
Magnesium: 1.6 mg/dL — ABNORMAL LOW (ref 1.7–2.4)
Magnesium: 1.6 mg/dL — ABNORMAL LOW (ref 1.7–2.4)

## 2015-08-22 LAB — PHOSPHORUS: PHOSPHORUS: 2 mg/dL — AB (ref 2.5–4.6)

## 2015-08-22 LAB — APTT: aPTT: 42 seconds — ABNORMAL HIGH (ref 24–36)

## 2015-08-22 MED ORDER — NOREPINEPHRINE BITARTRATE 1 MG/ML IV SOLN: 0.0000 ug/min | INTRAVENOUS | Status: DC

## 2015-08-22 MED ORDER — SODIUM CHLORIDE 0.9 % IV SOLN
Freq: Once | INTRAVENOUS | Status: AC
  Administered 2015-08-22: 15:00:00 via INTRAVENOUS

## 2015-08-22 MED ORDER — IPRATROPIUM-ALBUTEROL 0.5-2.5 (3) MG/3ML IN SOLN
3.0000 mL | RESPIRATORY_TRACT | Status: DC
  Administered 2015-08-22 – 2015-08-24 (×12): 3 mL via RESPIRATORY_TRACT
  Filled 2015-08-22 (×12): qty 3

## 2015-08-22 MED ORDER — PUREFLOW DIALYSIS SOLUTION
INTRAVENOUS | Status: DC
  Administered 2015-08-22 – 2015-08-24 (×5): via INTRAVENOUS_CENTRAL
  Administered 2015-08-25: 3 via INTRAVENOUS_CENTRAL

## 2015-08-22 MED ORDER — BUDESONIDE 0.5 MG/2ML IN SUSP
0.5000 mg | Freq: Two times a day (BID) | RESPIRATORY_TRACT | Status: DC
  Administered 2015-08-22 – 2015-09-01 (×20): 0.5 mg via RESPIRATORY_TRACT
  Filled 2015-08-22 (×20): qty 2

## 2015-08-22 MED ORDER — SODIUM CHLORIDE 0.9 % IV SOLN
Freq: Once | INTRAVENOUS | Status: AC
  Administered 2015-08-22: 23:00:00 via INTRAVENOUS

## 2015-08-22 MED ORDER — NOREPINEPHRINE 4 MG/250ML-% IV SOLN
INTRAVENOUS | Status: AC
  Administered 2015-08-22: 5 ug/min via INTRAVENOUS
  Filled 2015-08-22: qty 250

## 2015-08-22 MED ORDER — SODIUM CHLORIDE 0.9 % IV SOLN
Freq: Once | INTRAVENOUS | Status: AC
  Administered 2015-08-22: 07:00:00 via INTRAVENOUS

## 2015-08-22 MED ORDER — NOREPINEPHRINE BITARTRATE 1 MG/ML IV SOLN
0.0000 ug/min | INTRAVENOUS | Status: DC
  Administered 2015-08-22: 35 ug/min via INTRAVENOUS
  Administered 2015-08-22: 15 ug/min via INTRAVENOUS
  Administered 2015-08-23: 18 ug/min via INTRAVENOUS
  Filled 2015-08-22 (×3): qty 16

## 2015-08-22 MED ORDER — SODIUM CHLORIDE 0.9 % IV BOLUS (SEPSIS)
250.0000 mL | Freq: Once | INTRAVENOUS | Status: AC
  Administered 2015-08-22: 250 mL via INTRAVENOUS

## 2015-08-22 MED ORDER — NOREPINEPHRINE 4 MG/250ML-% IV SOLN
0.0000 ug/min | INTRAVENOUS | Status: DC
Start: 2015-08-22 — End: 2015-08-22
  Administered 2015-08-22: 5 ug/min via INTRAVENOUS

## 2015-08-22 MED ORDER — ASPIRIN 81 MG PO CHEW
324.0000 mg | CHEWABLE_TABLET | Freq: Every day | ORAL | Status: DC
  Administered 2015-08-22: 324 mg via ORAL
  Filled 2015-08-22: qty 4

## 2015-08-22 NOTE — Progress Notes (Signed)
EEG ordered for this morning per Dr. Katrinka Blazing.  Technician came at 0620 to do test, however because patient's husband is unwilling to leave the bedside, the technician said she would call the physician and left without completing the test.  This RN spoke with Dr. Enedina Finner regarding the situation and she will follow up.  Dr. Allena Katz also informed of patient's hemoglobin 6.7 this morning.  1unit pRBCs ordered.  Will continue to monitor.

## 2015-08-22 NOTE — Progress Notes (Signed)
Spoke to Dr. Wynelle Link regarding patient's potassium 6.0.  Orders received to change dialysis fluid from 4K to 2K.  Will titrate levophed for map > 55 per MD.  Will continue to monitor.

## 2015-08-22 NOTE — Progress Notes (Signed)
Subjective:  EEG not performed this morning.  MRI found large left frontal acute CVA. Neurology consulted.   Placed on CRRT. However now ultrafiltration turned off and placed on norepinephrine.   1 unit PRBC ordered.    Objective:  Vital signs in last 24 hours:  Temp:  [95.2 F (35.1 C)-98.8 F (37.1 C)] 97.4 F (36.3 C) (10/03 0605) Pulse Rate:  [87-103] 103 (10/03 0700) Resp:  [7-30] 15 (10/03 0700) BP: (87-147)/(33-91) 89/49 mmHg (10/03 0700) SpO2:  [100 %] 100 % (10/03 0700) FiO2 (%):  [28 %] 28 % (10/03 0739) Weight:  [81.2 kg (179 lb 0.2 oz)] 81.2 kg (179 lb 0.2 oz) (10/03 0500)  Weight change: 0.9 kg (1 lb 15.8 oz) Filed Weights   08/19/15 1615 08/20/15 1600 08/22/15 0500  Weight: 85.1 kg (187 lb 9.8 oz) 80.3 kg (177 lb 0.5 oz) 81.2 kg (179 lb 0.2 oz)    Intake/Output: I/O last 3 completed shifts: In: 3136.3 [I.V.:200; NG/GT:1826.3; IV Piggyback:1110] Out: 2515 [Other:2014; Stool:501]   Intake/Output this shift:     Physical Exam: General: Critically ill  Head: Normocephalic, atraumatic. +NGT   Eyes: Eyes open, sluggish to light.   Neck: Trach in place, 100% O2, left IJ catheter  Lungs:  Scattered rhonchi, normal effort  Heart: tachycardia  Abdomen:  Soft, nontender, BS present  Extremities:  +right upper extremity edema, trace edema of left upper extremity and dependent  Neurologic: Lethargic, opens eyes to verbal commands  Skin: No lesions  Access: R IJ permcath    Basic Metabolic Panel:  Recent Labs Lab 08/21/15 0839  08/21/15 1137 08/21/15 1748 08/21/15 2056 08/22/15 0133 08/22/15 0137 08/22/15 0539  NA 139  --  140  --  139 137  --  139  K 3.7  --  3.6  --  4.0 4.1  --  4.2  CL 106  --  106  --  106 106  --  105  CO2 32  --  29  --  31 30  --  30  GLUCOSE 155*  --  130*  --  121* 128*  --  126*  BUN 27*  --  25*  --  32* 28*  --  26*  CREATININE 0.61  --  0.71  --  0.68 0.67  --  0.60  CALCIUM 8.2*  --  8.1*  --  7.9* 7.8*  --  8.0*   MG  --   < > 1.6* 2.0 2.0  --  1.9 1.8  PHOS 1.7*  --  1.2*  --  2.6 2.5  --  2.0*  < > = values in this interval not displayed.  Liver Function Tests:  Recent Labs Lab 08/18/15 0507  08/21/15 0839 08/21/15 1137 08/21/15 2056 08/22/15 0133 08/22/15 0539  AST 23  --   --   --   --   --  22  ALT 22  --   --   --   --   --  19  ALKPHOS 384*  --   --   --   --   --  454*  BILITOT 1.0  --   --   --   --   --  0.6  PROT 4.4*  --   --   --   --   --  4.6*  ALBUMIN 1.7*  < > 2.1* 2.2* 1.9* 2.0* 2.0*  < > = values in this interval not displayed. No results for input(s): LIPASE, AMYLASE  in the last 168 hours. No results for input(s): AMMONIA in the last 168 hours.  CBC:  Recent Labs Lab 08/17/15 0446 08/18/15 0507 08/18/15 0939 08/19/15 0548 08/21/15 0417 08/22/15 0539  WBC 6.7 7.5 7.2 9.1 11.0 8.5  NEUTROABS 5.4  --   --   --  9.2*  --   HGB 8.3* 8.4* 8.1* 8.4* 8.4* 6.7*  HCT 25.5* 26.0* 25.5* 26.3* 25.9* 21.0*  MCV 92.6 93.0 93.0 93.9 94.2 93.9  PLT 17* 20* 20* 34* 60* 61*    Cardiac Enzymes: No results for input(s): CKTOTAL, CKMB, CKMBINDEX, TROPONINI in the last 168 hours.  BNP: Invalid input(s): POCBNP  CBG:  Recent Labs Lab 08/21/15 1728 08/21/15 2103 08/22/15 0014 08/22/15 0423 08/22/15 0712  GLUCAP 77 116* 109* 115* 115*    Microbiology: Results for orders placed or performed during the hospital encounter of 08/19/2015  Blood Culture (routine x 2)     Status: None   Collection Time: 08/06/2015  8:51 AM  Result Value Ref Range Status   Specimen Description BLOOD [name]  Final   Special Requests BOTTLES DRAWN AEROBIC AND ANAEROBIC  3CC  Final   Culture NO GROWTH 5 DAYS  Final   Report Status 08/17/2015 FINAL  Final  Blood Culture (routine x 2)     Status: None   Collection Time: 07/31/2015  9:20 AM  Result Value Ref Range Status   Specimen Description BLOOD LEFT ARM  Final   Special Requests BOTTLES DRAWN AEROBIC AND ANAEROBIC  1CC  Final   Culture NO  GROWTH 5 DAYS  Final   Report Status 08/17/2015 FINAL  Final  Wound culture     Status: None   Collection Time: 07/22/2015  9:20 AM  Result Value Ref Range Status   Specimen Description DECUBITIS  Final   Special Requests Normal  Final   Gram Stain   Final    FEW WBC SEEN MANY GRAM NEGATIVE RODS RARE GRAM POSITIVE COCCI    Culture   Final    HEAVY GROWTH ESCHERICHIA COLI MODERATE GROWTH ENTEROBACTER AEROGENES PROTEUS MIRABILIS HEAVY GROWTH ENTEROCOCCUS SPECIES VRE HAVE INTRINSIC RESISTANCE TO MOST COMMONLY USED ANTIBIOTICS AND THE ABILITY TO ACQUIRE RESISTANCE TO MOST AVAILABLE ANTIBIOTICS.    Report Status 08/16/2015 FINAL  Final   Organism ID, Bacteria ESCHERICHIA COLI  Final   Organism ID, Bacteria ENTEROBACTER AEROGENES  Final   Organism ID, Bacteria PROTEUS MIRABILIS  Final   Organism ID, Bacteria ENTEROCOCCUS SPECIES  Final      Susceptibility   Enterobacter aerogenes - MIC*    CEFTAZIDIME <=1 SENSITIVE Sensitive     CEFAZOLIN >=64 RESISTANT Resistant     CEFTRIAXONE <=1 SENSITIVE Sensitive     CIPROFLOXACIN <=0.25 SENSITIVE Sensitive     GENTAMICIN <=1 SENSITIVE Sensitive     IMIPENEM 1 SENSITIVE Sensitive     TRIMETH/SULFA <=20 SENSITIVE Sensitive     * MODERATE GROWTH ENTEROBACTER AEROGENES   Escherichia coli - MIC*    AMPICILLIN <=2 SENSITIVE Sensitive     CEFTAZIDIME <=1 SENSITIVE Sensitive     CEFAZOLIN <=4 SENSITIVE Sensitive     CEFTRIAXONE <=1 SENSITIVE Sensitive     CIPROFLOXACIN <=0.25 SENSITIVE Sensitive     GENTAMICIN <=1 SENSITIVE Sensitive     IMIPENEM <=0.25 SENSITIVE Sensitive     TRIMETH/SULFA <=20 SENSITIVE Sensitive     * HEAVY GROWTH ESCHERICHIA COLI   Proteus mirabilis - MIC*    AMPICILLIN >=32 RESISTANT Resistant     CEFTAZIDIME <=1  SENSITIVE Sensitive     CEFAZOLIN 8 SENSITIVE Sensitive     CEFTRIAXONE <=1 SENSITIVE Sensitive     CIPROFLOXACIN <=0.25 SENSITIVE Sensitive     GENTAMICIN <=1 SENSITIVE Sensitive     IMIPENEM 1 SENSITIVE  Sensitive     TRIMETH/SULFA <=20 SENSITIVE Sensitive     * PROTEUS MIRABILIS   Enterococcus species - MIC*    AMPICILLIN >=32 RESISTANT Resistant     VANCOMYCIN >=32 RESISTANT Resistant     GENTAMICIN SYNERGY SENSITIVE Sensitive     TETRACYCLINE Value in next row Resistant      RESISTANT>=16    * HEAVY GROWTH ENTEROCOCCUS SPECIES  MRSA PCR Screening     Status: None   Collection Time: 23-Aug-2015  2:38 PM  Result Value Ref Range Status   MRSA by PCR NEGATIVE NEGATIVE Final    Comment:        The GeneXpert MRSA Assay (FDA approved for NASAL specimens only), is one component of a comprehensive MRSA colonization surveillance program. It is not intended to diagnose MRSA infection nor to guide or monitor treatment for MRSA infections.   Blood culture (single)     Status: None   Collection Time: 2015-08-23  3:36 PM  Result Value Ref Range Status   Specimen Description BLOOD RIGHT ASSIST CONTROL  Final   Special Requests BOTTLES DRAWN AEROBIC AND ANAEROBIC  Final   Culture NO GROWTH 5 DAYS  Final   Report Status 08/17/2015 FINAL  Final  Wound culture     Status: None   Collection Time: 08/13/15 12:37 PM  Result Value Ref Range Status   Specimen Description WOUND  Final   Special Requests Normal  Final   Gram Stain   Final    FEW WBC SEEN TOO NUMEROUS TO COUNT GRAM NEGATIVE RODS FEW GRAM POSITIVE COCCI    Culture   Final    HEAVY GROWTH ESCHERICHIA COLI MODERATE GROWTH PROTEUS MIRABILIS LIGHT GROWTH KLEBSIELLA PNEUMONIAE MODERATE GROWTH ENTEROCOCCUS GALLINARUM CRITICAL RESULT CALLED TO, READ BACK BY AND VERIFIED WITH: [facility] BORBA AT 1042 08/16/15 DV    Report Status 08/17/2015 FINAL  Final   Organism ID, Bacteria ESCHERICHIA COLI  Final   Organism ID, Bacteria PROTEUS MIRABILIS  Final   Organism ID, Bacteria KLEBSIELLA PNEUMONIAE  Final   Organism ID, Bacteria ENTEROCOCCUS GALLINARUM  Final      Susceptibility   Escherichia coli - MIC*    AMPICILLIN >=32 RESISTANT  Resistant     CEFTAZIDIME 4 RESISTANT Resistant     CEFAZOLIN >=64 RESISTANT Resistant     CEFTRIAXONE 16 RESISTANT Resistant     GENTAMICIN 2 SENSITIVE Sensitive     IMIPENEM >=16 RESISTANT Resistant     TRIMETH/SULFA <=20 SENSITIVE Sensitive     Extended ESBL POSITIVE Resistant     PIP/TAZO Value in next row Resistant      RESISTANT>=128    CIPROFLOXACIN Value in next row Sensitive      SENSITIVE<=0.25    * HEAVY GROWTH ESCHERICHIA COLI   Klebsiella pneumoniae - MIC*    AMPICILLIN Value in next row Resistant      SENSITIVE<=0.25    CEFTAZIDIME Value in next row Resistant      SENSITIVE<=0.25    CEFAZOLIN Value in next row Resistant      SENSITIVE<=0.25    CEFTRIAXONE Value in next row Resistant      SENSITIVE<=0.25    CIPROFLOXACIN Value in next row Resistant      SENSITIVE<=0.25  GENTAMICIN Value in next row Sensitive      SENSITIVE<=0.25    IMIPENEM Value in next row Resistant      SENSITIVE<=0.25    TRIMETH/SULFA Value in next row Resistant      SENSITIVE<=0.25    PIP/TAZO Value in next row Resistant      RESISTANT>=128    * LIGHT GROWTH KLEBSIELLA PNEUMONIAE   Proteus mirabilis - MIC*    AMPICILLIN Value in next row Resistant      RESISTANT>=128    CEFTAZIDIME Value in next row Sensitive      RESISTANT>=128    CEFAZOLIN Value in next row Sensitive      RESISTANT>=128    CEFTRIAXONE Value in next row Sensitive      RESISTANT>=128    CIPROFLOXACIN Value in next row Sensitive      RESISTANT>=128    GENTAMICIN Value in next row Sensitive      RESISTANT>=128    IMIPENEM Value in next row Sensitive      RESISTANT>=128    TRIMETH/SULFA Value in next row Sensitive      RESISTANT>=128    PIP/TAZO Value in next row Sensitive      SENSITIVE<=4    * MODERATE GROWTH PROTEUS MIRABILIS   Enterococcus gallinarum - MIC*    AMPICILLIN Value in next row Resistant      SENSITIVE<=4    GENTAMICIN SYNERGY Value in next row Sensitive      SENSITIVE<=4    CIPROFLOXACIN Value  in next row Resistant      RESISTANT>=8    TETRACYCLINE Value in next row Resistant      RESISTANT>=16    * MODERATE GROWTH ENTEROCOCCUS GALLINARUM  Wound culture     Status: None   Collection Time: 08/15/15  2:53 PM  Result Value Ref Range Status   Specimen Description WOUND  Final   Special Requests NONE  Final   Gram Stain FEW WBC SEEN NO ORGANISMS SEEN   Final   Culture NO GROWTH 3 DAYS  Final   Report Status 08/18/2015 FINAL  Final  C difficile quick scan w PCR reflex     Status: None   Collection Time: 08/17/15 11:34 AM  Result Value Ref Range Status   C Diff antigen NEGATIVE NEGATIVE Final   C Diff toxin NEGATIVE NEGATIVE Final   C Diff interpretation Negative for C. difficile  Final    Coagulation Studies:  Recent Labs  08/21/15 2056  LABPROT 16.7*  INR 1.33    Urinalysis: No results for input(s): COLORURINE, LABSPEC, PHURINE, GLUCOSEU, HGBUR, BILIRUBINUR, KETONESUR, PROTEINUR, UROBILINOGEN, NITRITE, LEUKOCYTESUR in the last 72 hours.  Invalid input(s): APPERANCEUR    Imaging: Mr Sherrin Daisy Contrast  08/21/2015   CLINICAL DATA:  49 year old hypertensive diabetic female with end-stage renal disease on dialysis presenting with altered mental status. Hypotensive and 07/31/2015. Subsequent encounter.  EXAM: MRI HEAD WITHOUT CONTRAST  TECHNIQUE: Multiplanar, multiecho pulse sequences of the brain and surrounding structures were obtained without intravenous contrast.  COMPARISON:  08/04/2015 head CT.  01/28/2012 brain MR.  FINDINGS: Exam is significantly motion degraded.  Multiple acute infarcts. The largest infarct is within the left frontal lobe. Smaller infarct left parietal lobe. Scattered small infarcts noted in a parasagittal distribution right frontal -parietal lobe. Small infarcts occipital lobes and cerebellum bilaterally.  No intracranial hemorrhage.  No intracranial mass lesion noted on this unenhanced exam. (Evaluation limited by motion).  Global atrophy without  hydrocephalus.  Limited evaluation of intracranial vasculature  given the degree of motion.  Osseous changes of anemia.  Mild spinal stenosis C3-4 and C4-5. Cervical medullary junction unremarkable. Small pituitary gland felt to be incidental finding.  IMPRESSION: Exam is significantly motion degraded.  Multiple acute infarcts. The largest infarct is within the left frontal lobe. Smaller infarct left parietal lobe. Scattered small infarcts noted in a parasagittal distribution right frontal -parietal lobe. Small infarcts occipital lobes and cerebellum bilaterally.  No intracranial hemorrhage.  Global atrophy without hydrocephalus.  Limited evaluation of intracranial vasculature given the degree of motion.  These results will be called to the ordering clinician or representative by the Radiologist Assistant, and communication documented in the PACS or zVision Dashboard.   Electronically Signed   By: Lacy Duverney M.D.   On: 08/21/2015 16:56     Medications:   . feeding supplement (VITAL 1.5 CAL) Stopped (08/22/15 0600)  . norepinephrine    . pureflow 3 each (08/20/15 2353)   . acidophilus  1 capsule Oral Daily  . albumin human  12.5 g Intravenous BID  . antiseptic oral rinse  7 mL Mouth Rinse q12n4p  . aspirin  300 mg Rectal Daily  . chlorhexidine  15 mL Mouth Rinse BID  . DAPTOmycin (CUBICIN)  IV  500 mg Intravenous Q48H  . epoetin (EPOGEN/PROCRIT) injection  20,000 Units Subcutaneous Weekly  . feeding supplement (PRO-STAT SUGAR FREE 64)  30 mL Per Tube 6 times per day  . free water  100 mL Per Tube 3 times per day  . hydrocortisone  10 mg Oral BID  . insulin aspart  0-15 Units Subcutaneous 6 times per day  . meropenem (MERREM) IV  1 g Intravenous Q12H  . midodrine  10 mg Oral TID WC  . multivitamin  5 mL Per Tube Daily  . norepinephrine      . nystatin   Topical TID  . sodium hypochlorite   Irrigation BID   sodium chloride, sodium chloride, alteplase, anticoagulant sodium citrate,  lidocaine (PF), lidocaine-prilocaine, pentafluoroprop-tetrafluoroeth  Assessment/ Plan:  49 y.o. 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  1. End-stage renal disease on hemodialysis on HD TTHS. The patient has been on dialysis since October of 2014.  R IJ permcath. However unable to tolerate ultrafiltration with intermittent hemodialysis. Requiring vasopressors. Has been placed on CRRT yesterday. Currently tolerating well. - now without ultrafiltration.  - Continue CRRT due to hemodynamic instability  2. Anemia of ZOX:WRUE transfusion last on 9/23. Hemoglobin has dropped to 6.7 - continue EPO - plan for 1 unit PRBC transfusion today.   3. Secondary hyperparathyroidism: PTH low at 55. phos 2 - low due to poor PO intake, loss of muscle mass and CRRT clearance.  - monitor phos levels. May need IV phos replacement.   4. Septic shock: Multiple potential sources, has two catheters in place, also has a sacral decubitis, and also has trach in place.  - multiple organisms growing via wound culture - appreciate ID recommendations: currently on meropenem and daptomycin.   5. Thrombocytopenia:  Platelets improving. Off heparin.  Consider Palliative Care. Overall prognosis is poor.  LOS: 10 Nissa Stannard 10/3/20169:15 AM

## 2015-08-22 NOTE — Progress Notes (Signed)
Pharmacy Consult for Daptomycin/Meropenem Indication: Sepsis/Sacral decubitus  Allergies  Allergen Reactions  . Contrast Media [Iodinated Diagnostic Agents] Anaphylaxis  . Ampicillin Rash    Patient Measurements: Height:  (175.3 cm) Weight: 179 lb 0.2 oz (81.2 kg) IBW/kg (Calculated) : 66.2   Vital Signs: Temp: 97.4 F (36.3 C) (10/03 0605) Temp Source: Axillary (10/03 0605) BP: 89/49 mmHg (10/03 0700) Pulse Rate: 103 (10/03 0700) Intake/Output from previous day: 10/02 0701 - 10/03 0700 In: 2045 [I.V.:90; NG/GT:1105; IV Piggyback:850] Out: 1452 [Stool:300] Intake/Output from this shift:    Labs:  Recent Labs  08/21/15 0417  08/21/15 2056 08/22/15 0133 08/22/15 0539  WBC 11.0  --   --   --  8.5  HGB 8.4*  --   --   --  6.7*  PLT 60*  --   --   --  61*  CREATININE  --   < > 0.68 0.67 0.60  < > = values in this interval not displayed. Estimated Creatinine Clearance: 95.9 mL/min (by C-G formula based on Cr of 0.6). No results for input(s): VANCOTROUGH, VANCOPEAK, VANCORANDOM, GENTTROUGH, GENTPEAK, GENTRANDOM, TOBRATROUGH, TOBRAPEAK, TOBRARND, AMIKACINPEAK, AMIKACINTROU, AMIKACIN in the last 72 hours.   Microbiology: Recent Results (from the past 720 hour(s))  Culture, blood (routine x 2)     Status: None   Collection Time: 08/01/15  7:00 PM  Result Value Ref Range Status   Specimen Description BLOOD RIGHT ANTECUBITAL  Final   Special Requests BOTTLES DRAWN AEROBIC ONLY 5CC  Final   Culture NO GROWTH 5 DAYS  Final   Report Status 08/06/2015 FINAL  Final  Culture, blood (routine x 2)     Status: None   Collection Time: 08/01/15  7:10 PM  Result Value Ref Range Status   Specimen Description BLOOD RIGHT HAND  Final   Special Requests IN PEDIATRIC BOTTLE 3CC  Final   Culture  Setup Time   Final    GRAM POSITIVE COCCI IN CLUSTERS AEROBIC BOTTLE ONLY CRITICAL RESULT CALLED TO, READ BACK BY AND VERIFIED WITH: D TAYLOR,RN AT 1115 08/02/15 BY L BENFIELD    Culture  STAPHYLOCOCCUS SPECIES (COAGULASE NEGATIVE)  Final   Report Status 08/06/2015 FINAL  Final   Organism ID, Bacteria STAPHYLOCOCCUS SPECIES (COAGULASE NEGATIVE)  Final      Susceptibility   Staphylococcus species (coagulase negative) - MIC*    CIPROFLOXACIN >=8 RESISTANT Resistant     ERYTHROMYCIN <=0.25 SENSITIVE Sensitive     GENTAMICIN 8 INTERMEDIATE Intermediate     OXACILLIN >=4 RESISTANT Resistant     TETRACYCLINE <=1 SENSITIVE Sensitive     VANCOMYCIN 1 SENSITIVE Sensitive     TRIMETH/SULFA 160 RESISTANT Resistant     CLINDAMYCIN <=0.25 SENSITIVE Sensitive     RIFAMPIN <=0.5 SENSITIVE Sensitive     Inducible Clindamycin NEGATIVE Sensitive     * STAPHYLOCOCCUS SPECIES (COAGULASE NEGATIVE)  Culture, blood (routine x 2)     Status: None   Collection Time: 08/02/15  7:00 PM  Result Value Ref Range Status   Specimen Description BLOOD RIGHT ANTECUBITAL  Final   Special Requests BOTTLES DRAWN AEROBIC ONLY 10CC  Final   Culture  Setup Time   Final    GRAM POSITIVE COCCI IN CLUSTERS AEROBIC BOTTLE ONLY CRITICAL RESULT CALLED TO, READ BACK BY AND VERIFIED WITH: DR Gwenevere Abbot RN 1549 08/03/15 A BROWNING    Culture   Final    STAPHYLOCOCCUS SPECIES (COAGULASE NEGATIVE) SUSCEPTIBILITIES PERFORMED ON PREVIOUS CULTURE WITHIN THE LAST 5 DAYS.  Report Status 08/06/2015 FINAL  Final  Culture, blood (routine x 2)     Status: None   Collection Time: 08/02/15  7:05 PM  Result Value Ref Range Status   Specimen Description BLOOD RIGHT HAND  Final   Special Requests BOTTLES DRAWN AEROBIC AND ANAEROBIC 10CC  Final   Culture NO GROWTH 5 DAYS  Final   Report Status 08/07/2015 FINAL  Final  Blood Culture (routine x 2)     Status: None   Collection Time: 08-22-15  8:51 AM  Result Value Ref Range Status   Specimen Description BLOOD UNKO  Final   Special Requests BOTTLES DRAWN AEROBIC AND ANAEROBIC  3CC  Final   Culture NO GROWTH 5 DAYS  Final   Report Status 08/17/2015 FINAL  Final  Blood Culture  (routine x 2)     Status: None   Collection Time: August 22, 2015  9:20 AM  Result Value Ref Range Status   Specimen Description BLOOD LEFT ARM  Final   Special Requests BOTTLES DRAWN AEROBIC AND ANAEROBIC  1CC  Final   Culture NO GROWTH 5 DAYS  Final   Report Status 08/17/2015 FINAL  Final  Wound culture     Status: None   Collection Time: Aug 22, 2015  9:20 AM  Result Value Ref Range Status   Specimen Description DECUBITIS  Final   Special Requests Normal  Final   Gram Stain   Final    FEW WBC SEEN MANY GRAM NEGATIVE RODS RARE GRAM POSITIVE COCCI    Culture   Final    HEAVY GROWTH ESCHERICHIA COLI MODERATE GROWTH ENTEROBACTER AEROGENES PROTEUS MIRABILIS HEAVY GROWTH ENTEROCOCCUS SPECIES VRE HAVE INTRINSIC RESISTANCE TO MOST COMMONLY USED ANTIBIOTICS AND THE ABILITY TO ACQUIRE RESISTANCE TO MOST AVAILABLE ANTIBIOTICS.    Report Status 08/16/2015 FINAL  Final   Organism ID, Bacteria ESCHERICHIA COLI  Final   Organism ID, Bacteria ENTEROBACTER AEROGENES  Final   Organism ID, Bacteria PROTEUS MIRABILIS  Final   Organism ID, Bacteria ENTEROCOCCUS SPECIES  Final      Susceptibility   Enterobacter aerogenes - MIC*    CEFTAZIDIME <=1 SENSITIVE Sensitive     CEFAZOLIN >=64 RESISTANT Resistant     CEFTRIAXONE <=1 SENSITIVE Sensitive     CIPROFLOXACIN <=0.25 SENSITIVE Sensitive     GENTAMICIN <=1 SENSITIVE Sensitive     IMIPENEM 1 SENSITIVE Sensitive     TRIMETH/SULFA <=20 SENSITIVE Sensitive     * MODERATE GROWTH ENTEROBACTER AEROGENES   Escherichia coli - MIC*    AMPICILLIN <=2 SENSITIVE Sensitive     CEFTAZIDIME <=1 SENSITIVE Sensitive     CEFAZOLIN <=4 SENSITIVE Sensitive     CEFTRIAXONE <=1 SENSITIVE Sensitive     CIPROFLOXACIN <=0.25 SENSITIVE Sensitive     GENTAMICIN <=1 SENSITIVE Sensitive     IMIPENEM <=0.25 SENSITIVE Sensitive     TRIMETH/SULFA <=20 SENSITIVE Sensitive     * HEAVY GROWTH ESCHERICHIA COLI   Proteus mirabilis - MIC*    AMPICILLIN >=32 RESISTANT Resistant      CEFTAZIDIME <=1 SENSITIVE Sensitive     CEFAZOLIN 8 SENSITIVE Sensitive     CEFTRIAXONE <=1 SENSITIVE Sensitive     CIPROFLOXACIN <=0.25 SENSITIVE Sensitive     GENTAMICIN <=1 SENSITIVE Sensitive     IMIPENEM 1 SENSITIVE Sensitive     TRIMETH/SULFA <=20 SENSITIVE Sensitive     * PROTEUS MIRABILIS   Enterococcus species - MIC*    AMPICILLIN >=32 RESISTANT Resistant     VANCOMYCIN >=32 RESISTANT Resistant  GENTAMICIN SYNERGY SENSITIVE Sensitive     TETRACYCLINE Value in next row Resistant      RESISTANT>=16    * HEAVY GROWTH ENTEROCOCCUS SPECIES  MRSA PCR Screening     Status: None   Collection Time: 08/11/2015  2:38 PM  Result Value Ref Range Status   MRSA by PCR NEGATIVE NEGATIVE Final    Comment:        MurrMarland KitchenDillard CannonRSA Assay (FDA aBMarland Kitche SBell[facility] [id number]   Status: None   Collection Time: 08/13/15 12:37 PM  Result Value Ref Range Status   Specimen Description WOUND  Final   Special Requests Normal  Final   Gram Stain   Final    FEW WBC SEEN TOO NUMEROUS TO COUNT GRAM NEGATIVE RODS FEW GRAM POSITIVE COCCI    Culture   Final    HEAVY GROWTH ESCHERICHIA COLI MODERATE GROWTH PROTEUS MIRABILIS LIGHT GROWTH KLEBSIELLA PNEUMONIAE MODERATE GROWTH ENTEROCOCCUS GALLINARUM CRITICAL RESULT CALLED TO, READ BACK BY AND VERIFIED WITH: SANDRA BORBA AT 1042 08/16/15 DV    Report Status 08/17/2015 FINAL  Final   Organism ID, Bacteria ESCHERICHIA COLI  Final   Organism ID,  Bacteria PROTEUS MIRABILIS  Final   Organism ID, Bacteria KLEBSIELLA PNEUMONIAE  Final   Organism ID, Bacteria ENTEROCOCCUS GALLINARUM  Final      Susceptibility   Escherichia coli - MIC*    AMPICILLIN >=32 RESISTANT Resistant     CEFTAZIDIME 4 RESISTANT Resistant     CEFAZOLIN >=64 RESISTANT Resistant     CEFTRIAXONE 16 RESISTANT Resistant     GENTAMICIN 2 SENSITIVE Sensitive     IMIPENEM >=16 RESISTANT Resistant     TRIMETH/SULFA <=20 SENSITIVE Sensitive     Extended ESBL POSITIVE Resistant     PIP/TAZO Value in next row Resistant      RESISTANT>=128    CIPROFLOXACIN Value in next row Sensitive      SENSITIVE<=0.25    * HEAVY GROWTH ESCHERICHIA COLI   Klebsiella pneumoniae - MIC*    AMPICILLIN Value in next row Resistant      SENSITIVE<=0.25    CEFTAZIDIME Value in next row Resistant      SENSITIVE<=0.25    CEFAZOLIN Value in next row Resistant      SENSITIVE<=0.25    CEFTRIAXONE Value in next row Resistant      SENSITIVE<=0.25    CIPROFLOXACIN Value in next row Resistant      SENSITIVE<=0.25    GENTAMICIN Value in next row Sensitive      SENSITIVE<=0.25    IMIPENEM Value in next row Resistant      SENSITIVE<=0.25    TRIMETH/SULFA Value in next row Resistant      SENSITIVE<=0.25    PIP/TAZO Value in next row Resistant      RESISTANT>=128    * LIGHT GROWTH KLEBSIELLA PNEUMONIAE   Proteus mirabilis - MIC*    AMPICILLIN Value in next row Resistant  RESISTANT>=128    CEFTAZIDIME Value in next row Sensitive      RESISTANT>=128    CEFAZOLIN Value in next row Sensitive      RESISTANT>=128    CEFTRIAXONE Value in next row Sensitive      RESISTANT>=128    CIPROFLOXACIN Value in next row Sensitive      RESISTANT>=128    GENTAMICIN Value in next row Sensitive      RESISTANT>=128    IMIPENEM Value in next row Sensitive      RESISTANT>=128    TRIMETH/SULFA Value in next row Sensitive      RESISTANT>=128    PIP/TAZO Value in next row Sensitive      SENSITIVE<=4    *  MODERATE GROWTH PROTEUS MIRABILIS   Enterococcus gallinarum - MIC*    AMPICILLIN Value in next row Resistant      SENSITIVE<=4    GENTAMICIN SYNERGY Value in next row Sensitive      SENSITIVE<=4    CIPROFLOXACIN Value in next row Resistant      RESISTANT>=8    TETRACYCLINE Value in next row Resistant      RESISTANT>=16    * MODERATE GROWTH ENTEROCOCCUS GALLINARUM  Wound culture     Status: None   Collection Time: 08/15/15  2:53 PM  Result Value Ref Range Status   Specimen Description WOUND  Final   Special Requests NONE  Final   Gram Stain FEW WBC SEEN NO ORGANISMS SEEN   Final   Culture NO GROWTH 3 DAYS  Final   Report Status 08/18/2015 FINAL  Final  C difficile quick scan w PCR reflex     Status: None   Collection Time: 08/17/15 11:34 AM  Result Value Ref Range Status   C Diff antigen NEGATIVE NEGATIVE Final   C Diff toxin NEGATIVE NEGATIVE Final   C Diff interpretation Negative for C. difficile  Final    Medical History: Past Medical History  Diagnosis Date  . Obesity   . Dyslipidemia   . Hypertension   . Coronary artery disease     s/p BMS 2010 LAD  . Dysrhythmia     ventricular tachycardia resolved after LAD stent and beta blocker  . Diabetes mellitus     with retinopathy, neuropathy and microalbuminemia  . ESRD (end stage renal disease) on dialysis     Medications:  Scheduled:  . acidophilus  1 capsule Oral Daily  . albumin human  12.5 g Intravenous BID  . antiseptic oral rinse  7 mL Mouth Rinse q12n4p  . aspirin  300 mg Rectal Daily  . chlorhexidine  15 mL Mouth Rinse BID  . DAPTOmycin (CUBICIN)  IV  500 mg Intravenous Q48H  . epoetin (EPOGEN/PROCRIT) injection  20,000 Units Subcutaneous Weekly  . feeding supplement (PRO-STAT SUGAR FREE 64)  30 mL Per Tube 6 times per day  . free water  100 mL Per Tube 3 times per day  . hydrocortisone  10 mg Oral BID  . insulin aspart  0-15 Units Subcutaneous 6 times per day  . meropenem (MERREM) IV  1 g Intravenous  Q12H  . midodrine  10 mg Oral TID WC  . multivitamin  5 mL Per Tube Daily  . nystatin   Topical TID  . sodium hypochlorite   Irrigation BID   Infusions:  . feeding supplement (VITAL 1.5 CAL) Stopped (08/22/15 0600)  . norepinephrine (LEVOPHED) Adult infusion    . 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 Patient is on day 10 of antibiotics (day 6 of daptomycin and day 10 of meropenem).  Goal of Therapy:  Resolution of infection  Plan:   Patient converted to CRRT. Will continue daptomycin 6 mg/kg q 48 hours and meropenem 1000 mg IV Q12 hours.   Harlei Lehrmann D 08/22/2015,9:29 AM

## 2015-08-22 NOTE — Progress Notes (Signed)
ELECTROLYTE MANAGEMENT - FOLLOW UP   Pharmacy Consult for Electrolyte Management Indication: hypokalemia  Allergies  Allergen Reactions  . Contrast Media [Iodinated Diagnostic Agents] Anaphylaxis  . Ampicillin Rash    Patient Measurements: Height:  (175.3 cm) Weight: 179 lb 0.2 oz (81.2 kg) IBW/kg (Calculated) : 66.2   Vital Signs: Temp: 97.4 F (36.3 C) (10/03 0605) Temp Source: Axillary (10/03 0605) BP: 89/49 mmHg (10/03 0700) Pulse Rate: 103 (10/03 0700) Intake/Output from previous day: 10/02 0701 - 10/03 0700 In: 2045 [I.V.:90; NG/GT:1105; IV Piggyback:850] Out: 1452 [Stool:300] Intake/Output from this shift:    Labs:  Recent Labs  08/21/15 0417 08/21/15 0839 08/21/15 2056 08/22/15 0539  WBC 11.0  --   --  8.5  HGB 8.4*  --   --  6.7*  HCT 25.9*  --   --  21.0*  PLT 60*  --   --  61*  APTT  --  39*  --  42*  INR  --   --  1.33  --      Recent Labs  08/21/15 2056 08/22/15 0133 08/22/15 0137 08/22/15 0539  NA 139 137  --  139  K 4.0 4.1  --  4.2  CL 106 106  --  105  CO2 31 30  --  30  GLUCOSE 121* 128*  --  126*  BUN 32* 28*  --  26*  CREATININE 0.68 0.67  --  0.60  CALCIUM 7.9* 7.8*  --  8.0*  MG 2.0  --  1.9 1.8  PHOS 2.6 2.5  --  2.0*  PROT  --   --   --  4.6*  ALBUMIN 1.9* 2.0*  --  2.0*  AST  --   --   --  22  ALT  --   --   --  19  ALKPHOS  --   --   --  454*  BILITOT  --   --   --  0.6   Estimated Creatinine Clearance: 95.9 mL/min (by C-G formula based on Cr of 0.6).    Recent Labs  08/22/15 0014 08/22/15 0423 08/22/15 0712  GLUCAP 109* 115* 115*    Medical History: Past Medical History  Diagnosis Date  . Obesity   . Dyslipidemia   . Hypertension   . Coronary artery disease     s/p BMS 2010 LAD  . Dysrhythmia     ventricular tachycardia resolved after LAD stent and beta blocker  . Diabetes mellitus     with retinopathy, neuropathy and microalbuminemia  . ESRD (end stage renal disease) on dialysis      Medications:  Scheduled:  . acidophilus  1 capsule Oral Daily  . albumin human  12.5 g Intravenous BID  . antiseptic oral rinse  7 mL Mouth Rinse q12n4p  . aspirin  300 mg Rectal Daily  . chlorhexidine  15 mL Mouth Rinse BID  . DAPTOmycin (CUBICIN)  IV  500 mg Intravenous Q48H  . epoetin (EPOGEN/PROCRIT) injection  20,000 Units Subcutaneous Weekly  . feeding supplement (PRO-STAT SUGAR FREE 64)  30 mL Per Tube 6 times per day  . free water  100 mL Per Tube 3 times per day  . hydrocortisone  10 mg Oral BID  . insulin aspart  0-15 Units Subcutaneous 6 times per day  . meropenem (MERREM) IV  1 g Intravenous Q12H  . midodrine  10 mg Oral TID WC  . multivitamin  5 mL Per Tube Daily  .  nystatin   Topical TID  . sodium hypochlorite   Irrigation BID    Assessment: 49 yo female admitted for sepsis, found to have hypokalemia. Pharmacy consulted for electrolyte management.  Plan:  All labs within normal limits.   Pharmacy will continue to monitor and adjust per consult.    Aston Lieske D, Pharm.D. Clinical Pharmacist 08/22/2015,9:27 AM

## 2015-08-22 NOTE — Progress Notes (Signed)
eLink Physician-Brief Progress Note Patient Name: Courtney [name] DOB: [birth year] MRN: [id number]   Date of Service  08/22/2015  HPI/Events of Note   Recent Labs Lab 08/18/15 0939 08/19/15 0548 08/21/15 0417 08/22/15 0539 08/22/15 2146  HGB 8.1* 8.4* 8.4* 6.7* 6.1*     eICU Interventions  No obvious bleeding per RN  1 unit PRBC     Intervention Category Intermediate Interventions: Other:  [name] 08/22/2015, 10:35 PM

## 2015-08-22 NOTE — Care Management Note (Signed)
Case Management Note  Patient Details  Name: Nakiea Metzner MRN: [id number] Date of Birth: 11/22/1965  Subjective/Objective:   MRI with multiple infarcts noted. Tracheal bleeding, transfusing PLT. CRT Husband has been difficult with staff. Dr. Randa Lynn meeting with spouse.                 Action/Plan: SNF  Expected Discharge Date:                  Expected Discharge Plan:    In-House Referral:     Discharge planning Services  CM Consult  Post Acute Care Choice:    Choice offered to:     DME Arranged:    DME Agency:     HH Arranged:    HH Agency:     Status of Service:  In process, will continue to follow  Medicare Important Message Given:    Date Medicare IM Given:    Medicare IM give by:    Date Additional Medicare IM Given:    Additional Medicare Important Message give by:     If discussed at Long Length of Stay Meetings, dates discussed:    Additional Comments:  [name], RN 08/22/2015, 1:37 PM

## 2015-08-22 NOTE — Progress Notes (Signed)
Nutrition Follow-up    INTERVENTION:   EN: recommend continuing current TF regimen with Prostat via dobhoff tube; ultimately would recommend attempting to titrate TF back to rate of 50 ml/hr as tolerated by pt   NUTRITION DIAGNOSIS:   Inadequate oral intake related to wound healing, acute illness, chronic illness as evidenced by NPO status, estimated needs.  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, EEG pending this AM, started on CRRT yesterday without UF at present, pt remains on trach collar via cuffless trach, Hgb 6.7 with plans for transfusion  EN: tolerating Vital 1.5 at rate of 35 ml/hr, Prostat 6 times daily  Skin:   (stage IV ulcer on foot, unstageable on sacrum)  Last BM:  Rectal tube in place, liquid stool  Electrolyte and Renal Profile:  Recent Labs Lab 08/21/15 2056 08/22/15 0133 08/22/15 0137 08/22/15 0539  BUN 32* 28*  --  26*  CREATININE 0.68 0.67  --  0.60  NA 139 137  --  139  K 4.0 4.1  --  4.2  MG 2.0  --  1.9 1.8  PHOS 2.6 2.5  --  2.0*   Glucose Profile:  Recent Labs  08/22/15 0423 08/22/15 0712 08/22/15 1140  GLUCAP 115* 115* 163*   Meds: acidophilus, MVI, ss novolog, levophed, midodrine  Height:   Ht Readings from Last 1 Encounters:  2015/09/10  (1.753 m)    Weight:   Wt Readings from Last 1 Encounters:  08/22/15 179 lb 0.2 oz (81.2 kg)   Filed Weights   08/19/15 1615 08/20/15 1600 08/22/15 0500  Weight: 187 lb 9.8 oz (85.1 kg) 177 lb 0.5 oz (80.3 kg) 179 lb 0.2 oz (81.2 kg)     BMI:  Body mass index is 26.42 kg/(m^2).  Estimated Nutritional Needs:   Kcal:  9811-9147 kcals (BEE 1434, 1.2 AF, 1.2-1.4 IF)   Protein:  113-150 g/kg (1.5-2.0  g/kg) but may be closer to 150-188 g (2.0-2.5 g/kg)   Fluid:  1000 mL plus UOP  HIGH Care Level  Romelle Starcher MS, RD, LDN [phone] Pager

## 2015-08-22 NOTE — Progress Notes (Signed)
NEUROLOGY NOTE  S: Still somewhat somnolent;  Does open per nursing  ROS unobtainable to aphasia  O: 97.3   102/55     103     18 Moderate distress, overweight Normocephalic, oropharynx clear Supple, no JVD CTA B, no wheezing tachycardic, no murmur Diffuse edema, no C/C  Trached, not sedated, opens to pain but does not track or follow, GCS 7T Pupils 4mm and sluggish, weak R corneal, good L corneal, R droop R hemiplegia  EEG mild slowing  A/P: 1.  Multiple embolic acute infarcts-  Etiology still appears to be cardioembolic to me but cant r/o DIC as cause.  This will still cause permanent deficits of R hemiparesis and aphasia which will not likely get better 2.  Encephalopathy-  Improved, likely due to multiple medical problems -  Discussed possibility of DIC with Dr. Belia Heman and await Hem/Onc opinion on this -  Echo completed but await official read;  Still believe that pt will need TEE if neg -  Check blood cultures again -  Ok to continue ASA  PR -  Try to keep Hgb > 8 -  Keep MAP > 60 for good perfusion -  Antibiotics per ID but would cover for possible endocarditis -  Will still follow with you

## 2015-08-22 NOTE — Progress Notes (Signed)
*  PRELIMINARY RESULTS* Echocardiogram 2D Echocardiogram has been performed.  Georgann Housekeeper Hege 08/22/2015, 1:50 PM

## 2015-08-22 NOTE — Progress Notes (Signed)
   08/22/15 1230  Clinical Encounter Type  Visited With Patient;Family  Visit Type Follow-up;Spiritual support  Referral From Nurse  Consult/Referral To Chaplain  Spiritual Encounters  Spiritual Needs Emotional;Other (Comment)  Stress Factors  Patient Stress Factors None identified  Family Stress Factors Health changes;Other (Comment)  Chaplain rounded in the unit and went to check on patient. Later saw spouse but was unavailable at the time. Nurse indicated to me where he went and I followed up with spouse and we had an extensive conversation about his wife's health changes/challenges and life. Offered a compassionate presence and a listening ear. Very pleasant and insightful time of sharing and reflection. Chaplain Kandi Brusseau A. Kyleigha Markert Ext. [phone]

## 2015-08-22 NOTE — Progress Notes (Addendum)
[name] upset about the delay in Neurology evaluation and that it took [place] 4 days to get MRI brain.  There was a converstation held this morning with Dr Belia Heman present. At this point, I feel I am not able to continue taking care of Courtney [name] and her husband is aware of it.  Dr Greig Castilla Lamb,MD aware

## 2015-08-22 NOTE — Progress Notes (Signed)
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   GASTROINTESTINAL A:  Hx of C. Diff - PCR neg P:   SUP: N/I unless requires vent support Begin D III diet with supervision 9/25 Check c.diff PCR>>neg TF goal rate reduced to 35, due to have moderate secretions and concerns for aspiration   HEMATOLOGIC A:   Acute on chronic anemia Thrombocytopenia, . Etiology unclear. Was on heparin PTA P:  DVT px: SCDs Monitor CBC intermittently Transfuse per usual guidelines Check HIT panel 9/25 - negative -will give platelet transfusions  INFECTIOUS A:   Severe sepsis Sacral decubitus ulcer with abscess P:   Monitor temp, WBC count Micro and abx as above Surgery following ID recs appreciated - multiple MDR organisms  ENDOCRINE A:  DM 2, controlled Chronic prednisone therapy - chronic dose 10 mg BID Secondary adrenal insuff P:   CBGs and SSI - mod scale Stress dose steroids - wean as able based on BP  NEUROLOGIC A:   Acute encephalopathy, -somnolent still, suspect due to chronic sepsis with metabolic encephalopathy.  MRI suggest acute infarcts follow up Neuro recs -EEG pending RASS goal: 0 Minimize sedating meds - baseline mentation is talking and following commands.     ADMISSION DATE:  08/03/2015 CONSULTATION DATE: 9/23  INITIAL  PRESENTATION:  53 F who has been in medical facilities (hosp, LTAC, rehab) for 2 yrs following gastric bypass surgery with multiple complications. Now with chronic trach, ESRD, profound debilitation, severe sacral pressure ulcer. Was seemingly making progress and transferred to rehab facility approx one week prior to this admission. She was sent to [facility] ED with AMS and hypotension. Working dx of severe sepsis/septic shock due to infected sacral pressure ulcer. Also has R side externalized HD cath and tunneled L IJ CVL. She has history of resistant bacterial infections. She is on chronic steroids. Her husband indicates that she has been diagnosed with adrenal insuff at some point during the past 2 yrs  MAJOR EVENTS/TEST RESULTS: 9/23 CT head: NAD 9/23 EEG: no epileptiform activity 9/23 PRBCs for Hgb 6.4 9/24 bedside debridement of sacral wound. Abscess drained 9/25 Off vasopressors. More alert. No distress. Worsening thrombocytopenia. Vanc DC'd 10/2 MRI -multiple infarcts 10/3 tracheal bleeding-will transfuse platelets  INDWELLING DEVICES:: Trach (chronic)  Tunneled R IJ HD cath (chronic) Tunneled L IJ CVL (chronic) L femoral A-line 9/23 >> 9/25  MICRO DATA: MRSA PCR 9/23 >> NEG Urine 9/23 >>  Wound (swab) 9/23 >> many GNR, rare GPC >>  Blood 9/23 >> neg  Wound (debridement) 9/24 >> heavy GNR >> CRE, Enterococcus, K. Pneumoniae, P. Mirabilis, VRE CDiff 9/27>>neg  ANTIMICROBIALS:  Aztreonam 9/23 >> 9/24 Vanc 9/23 >> 9/25 Vanc 9/26>>9/27 Meropenem 9/23 >>  Daptomycin 9/27>>   SUBJECTIVE:  RASS, -1.  Nothing purposeful at this time. On Trach collar at 28%, having moderate bloody secretions today.  Open eyes,  but not tracking, husband at bedside Plt count mildly increasing, on CRRT    VITAL SIGNS: Temp:  [95.2 F (35.1 C)-98.8 F (37.1 C)] 97.4 F (36.3 C) (10/03 0605) Pulse Rate:  [87-103] 103 (10/03 0700) Resp:  [7-30] 15 (10/03 0700) BP: (87-147)/(33-91) 89/49 mmHg  (10/03 0700) SpO2:  [100 %] 100 % (10/03 0700) FiO2 (%):  [28 %] 28 % (10/03 0739) Weight:  [179 lb 0.2 oz (81.2 kg)] 179 lb 0.2 oz (81.2 kg) (10/03 0500) HEMODYNAMICS:   VENTILATOR SETTINGS: Vent Mode:  [-]  FiO2 (%):  [28 %] 28 % INTAKE / OUTPUT:  Intake/Output Summary (Last 24 hours) at 08/22/15 0949 Last data filed at 08/22/15 0600  Gross per 24 hour  Intake   1955 ml  Output   1305 ml  Net    650 ml    PHYSICAL EXAMINATION: General: RASS 0, -1, + F/C, no respiratory distress Neuro: CNs intact, MAEs, DTRs symmetric HEENT: Cushingoid facies Neck: no JVD noted, trach site clean Cardiovascular: Regular, no M noted Lungs: No adventitious sounds Abdomen: Obese, soft, NT, diminished BS Ext: cool, diminished distal pulses, no edema Skin: very deep, malodorous sacral pressure ulcer  LABS:  CBC  Recent Labs Lab 08/19/15 0548 08/21/15 0417 08/22/15 0539  WBC 9.1 11.0 8.5  HGB 8.4* 8.4* 6.7*  HCT 26.3* 25.9* 21.0*  PLT 34* 60* 61*   Coag's  Recent Labs Lab 08/16/15 1323 08/21/15 0839 08/21/15 2056 08/22/15 0539  APTT 82* 39*  --  42*  INR 1.48  --  1.33  --    BMET  Recent Labs Lab 08/21/15 2056 08/22/15 0133 08/22/15 0539  NA 139 137 139  K 4.0 4.1 4.2  CL 106 106 105  CO2 BUN 32* 28* 26*  CREATININE 0.68 0.67 0.60  GLUCOSE 121* 128* 126*   Electrolytes  Recent Labs Lab 08/21/15 2056 08/22/15 0133 08/22/15 0137 08/22/15 0539  CALCIUM 7.9* 7.8*  --  8.0*  MG 2.0  --  1.9 1.8  PHOS 2.6 2.5  --  2.0*   Sepsis Markers No results for input(s): LATICACIDVEN, PROCALCITON, O2SATVEN in the last 168 hours. ABG  Recent Labs Lab 08/16/15 0521 08/17/15 1154  PHART 7.46* 7.48*  PCO2ART 44 45  PO2ART 92 111*   Liver Enzymes  Recent Labs Lab 08/18/15 0507  08/21/15 2056 08/22/15 0133 08/22/15 0539  AST 23  --   --   --  22  ALT 22  --   --   --  19  ALKPHOS 384*  --   --   --  454*  BILITOT 1.0  --   --   --  0.6  ALBUMIN  1.7*  < > 1.9* 2.0* 2.0*  < > = values in this interval not displayed. Cardiac Enzymes No results for input(s): TROPONINI, PROBNP in the last 168 hours. Glucose  Recent Labs Lab 08/21/15 1156 08/21/15 1728 08/21/15 2103 08/22/15 0014 08/22/15 0423 08/22/15 0712  GLUCAP 130* 77 116* 109* 115* 115*    I have personally obtained a history, examined the patient, evaluated Pertinent laboratory and RadioGraphic/imaging results, and  formulated the assessment and plan   The Patient requires high complexity decision making for assessment and support, frequent evaluation and titration of therapies, application of advanced monitoring technologies and extensive interpretation of multiple databases. Critical Care Time devoted to patient care services described in this note is 45 minutes.   Overall, patient is critically ill, prognosis is  guarded.  Patient with Multiorgan failure and at high risk for cardiac arrest and death.    Corrin Parker, M.D.  [facility]  Medical Director [place] Director [facility] Cardio-Pulmonary Department

## 2015-08-23 ENCOUNTER — Inpatient Hospital Stay: Payer: 59

## 2015-08-23 DIAGNOSIS — K922 Gastrointestinal hemorrhage, unspecified: Secondary | ICD-10-CM | POA: Insufficient documentation

## 2015-08-23 LAB — CBC
HCT: 23.6 % — ABNORMAL LOW (ref 35.0–47.0)
HCT: 22.3 % — ABNORMAL LOW (ref 35.0–47.0)
HCT: 27 % — ABNORMAL LOW (ref 35.0–47.0)
HEMATOCRIT: 21.4 % — AB (ref 35.0–47.0)
HEMOGLOBIN: 7 g/dL — AB (ref 12.0–16.0)
HEMOGLOBIN: 8.8 g/dL — AB (ref 12.0–16.0)
Hemoglobin: 7.7 g/dL — ABNORMAL LOW (ref 12.0–16.0)
Hemoglobin: 7.4 g/dL — ABNORMAL LOW (ref 12.0–16.0)
MCH: 28.8 pg (ref 26.0–34.0)
MCH: 29.2 pg (ref 26.0–34.0)
MCH: 28.9 pg (ref 26.0–34.0)
MCH: 27.8 pg (ref 26.0–34.0)
MCHC: 32.5 g/dL (ref 32.0–36.0)
MCHC: 33 g/dL (ref 32.0–36.0)
MCHC: 32.5 g/dL (ref 32.0–36.0)
MCHC: 32.6 g/dL (ref 32.0–36.0)
MCV: 88.4 fL (ref 80.0–100.0)
MCV: 88.4 fL (ref 80.0–100.0)
MCV: 89 fL (ref 80.0–100.0)
MCV: 85.3 fL (ref 80.0–100.0)
PLATELETS: 110 10*3/uL — AB (ref 150–440)
PLATELETS: 101 10*3/uL — AB (ref 150–440)
Platelets: UNDETERMINED 10*3/uL (ref 150–440)
Platelets: 84 10*3/uL — ABNORMAL LOW (ref 150–440)
RBC: 2.67 MIL/uL — ABNORMAL LOW (ref 3.80–5.20)
RBC: 2.52 MIL/uL — AB (ref 3.80–5.20)
RBC: 2.41 MIL/uL — ABNORMAL LOW (ref 3.80–5.20)
RBC: 3.16 MIL/uL — AB (ref 3.80–5.20)
RDW: 17.3 % — AB (ref 11.5–14.5)
RDW: 17.8 % — ABNORMAL HIGH (ref 11.5–14.5)
RDW: 18.3 % — AB (ref 11.5–14.5)
RDW: 18 % — ABNORMAL HIGH (ref 11.5–14.5)
WBC: 16.8 10*3/uL — AB (ref 3.6–11.0)
WBC: 14.3 10*3/uL — AB (ref 3.6–11.0)
WBC: 12.9 10*3/uL — AB (ref 3.6–11.0)
WBC: 11.2 10*3/uL — ABNORMAL HIGH (ref 3.6–11.0)

## 2015-08-23 LAB — MAGNESIUM
MAGNESIUM: 1.6 mg/dL — AB (ref 1.7–2.4)
MAGNESIUM: 1.7 mg/dL (ref 1.7–2.4)
MAGNESIUM: 1.5 mg/dL — AB (ref 1.7–2.4)
MAGNESIUM: 15 mg/dL — AB (ref 1.7–2.4)
MAGNESIUM: 2.4 mg/dL (ref 1.7–2.4)
Magnesium: 2.2 mg/dL (ref 1.7–2.4)

## 2015-08-23 LAB — RENAL FUNCTION PANEL
ALBUMIN: 2.5 g/dL — AB (ref 3.5–5.0)
ALBUMIN: 2 g/dL — AB (ref 3.5–5.0)
ALBUMIN: 2.1 g/dL — AB (ref 3.5–5.0)
ANION GAP: 5 (ref 5–15)
ANION GAP: 5 (ref 5–15)
Albumin: 2.5 g/dL — ABNORMAL LOW (ref 3.5–5.0)
Albumin: 2.1 g/dL — ABNORMAL LOW (ref 3.5–5.0)
Anion gap: 3 — ABNORMAL LOW (ref 5–15)
Anion gap: 3 — ABNORMAL LOW (ref 5–15)
Anion gap: 4 — ABNORMAL LOW (ref 5–15)
BUN: 32 mg/dL — AB (ref 6–20)
BUN: 32 mg/dL — AB (ref 6–20)
BUN: 26 mg/dL — ABNORMAL HIGH (ref 6–20)
BUN: 26 mg/dL — ABNORMAL HIGH (ref 6–20)
BUN: 23 mg/dL — ABNORMAL HIGH (ref 6–20)
CALCIUM: 8 mg/dL — AB (ref 8.9–10.3)
CHLORIDE: 108 mmol/L (ref 101–111)
CHLORIDE: 108 mmol/L (ref 101–111)
CHLORIDE: 101 mmol/L (ref 101–111)
CO2: 28 mmol/L (ref 22–32)
CO2: 28 mmol/L (ref 22–32)
CO2: 27 mmol/L (ref 22–32)
CO2: 28 mmol/L (ref 22–32)
CO2: 27 mmol/L (ref 22–32)
CREATININE: 0.54 mg/dL (ref 0.44–1.00)
CREATININE: 0.48 mg/dL (ref 0.44–1.00)
Calcium: 7.9 mg/dL — ABNORMAL LOW (ref 8.9–10.3)
Calcium: 7.9 mg/dL — ABNORMAL LOW (ref 8.9–10.3)
Calcium: 7.9 mg/dL — ABNORMAL LOW (ref 8.9–10.3)
Calcium: 8 mg/dL — ABNORMAL LOW (ref 8.9–10.3)
Chloride: 105 mmol/L (ref 101–111)
Chloride: 106 mmol/L (ref 101–111)
Creatinine, Ser: 0.55 mg/dL (ref 0.44–1.00)
Creatinine, Ser: 0.43 mg/dL — ABNORMAL LOW (ref 0.44–1.00)
Creatinine, Ser: 0.57 mg/dL (ref 0.44–1.00)
GFR calc Af Amer: >60 mL/min (ref >60)
GFR calc Af Amer: >60 mL/min (ref >60)
GFR calc non Af Amer: >60 mL/min (ref >60)
GLUCOSE: 254 mg/dL — AB (ref 65–99)
GLUCOSE: 106 mg/dL — AB (ref 65–99)
Glucose, Bld: 178 mg/dL — ABNORMAL HIGH (ref 65–99)
Glucose, Bld: 97 mg/dL (ref 65–99)
Glucose, Bld: 98 mg/dL (ref 65–99)
PHOSPHORUS: 1.6 mg/dL — AB (ref 2.5–4.6)
PHOSPHORUS: 2.3 mg/dL — AB (ref 2.5–4.6)
PHOSPHORUS: 1.9 mg/dL — AB (ref 2.5–4.6)
POTASSIUM: 4 mmol/L (ref 3.5–5.1)
POTASSIUM: 3.5 mmol/L (ref 3.5–5.1)
POTASSIUM: 3.4 mmol/L — AB (ref 3.5–5.1)
Phosphorus: 1.8 mg/dL — ABNORMAL LOW (ref 2.5–4.6)
Phosphorus: 2.1 mg/dL — ABNORMAL LOW (ref 2.5–4.6)
Potassium: 3.3 mmol/L — ABNORMAL LOW (ref 3.5–5.1)
Potassium: 3.5 mmol/L (ref 3.5–5.1)
SODIUM: 137 mmol/L (ref 135–145)
Sodium: 139 mmol/L (ref 135–145)
Sodium: 139 mmol/L (ref 135–145)
Sodium: 133 mmol/L — ABNORMAL LOW (ref 135–145)
Sodium: 138 mmol/L (ref 135–145)

## 2015-08-23 LAB — BASIC METABOLIC PANEL
ANION GAP: 6 (ref 5–15)
BUN: 31 mg/dL — ABNORMAL HIGH (ref 6–20)
CALCIUM: 8.3 mg/dL — AB (ref 8.9–10.3)
CHLORIDE: 107 mmol/L (ref 101–111)
CO2: 27 mmol/L (ref 22–32)
Creatinine, Ser: 0.51 mg/dL (ref 0.44–1.00)
GFR calc non Af Amer: >60 mL/min (ref >60)
GLUCOSE: 182 mg/dL — AB (ref 65–99)
POTASSIUM: 3.5 mmol/L (ref 3.5–5.1)
Sodium: 140 mmol/L (ref 135–145)

## 2015-08-23 LAB — GLUCOSE, CAPILLARY
GLUCOSE-CAPILLARY: 111 mg/dL — AB (ref 65–99)
GLUCOSE-CAPILLARY: 202 mg/dL — AB (ref 65–99)
GLUCOSE-CAPILLARY: 84 mg/dL (ref 65–99)
Glucose-Capillary: 198 mg/dL — ABNORMAL HIGH (ref 65–99)
Glucose-Capillary: 39 mg/dL — CL (ref 65–99)
Glucose-Capillary: 61 mg/dL — ABNORMAL LOW (ref 65–99)
Glucose-Capillary: 84 mg/dL (ref 65–99)
Glucose-Capillary: 94 mg/dL (ref 65–99)

## 2015-08-23 LAB — PHOSPHORUS: Phosphorus: 2.3 mg/dL — ABNORMAL LOW (ref 2.5–4.6)

## 2015-08-23 LAB — POTASSIUM: POTASSIUM: 3.4 mmol/L — AB (ref 3.5–5.1)

## 2015-08-23 LAB — APTT: aPTT: 54 seconds — ABNORMAL HIGH (ref 24–36)

## 2015-08-23 LAB — PREPARE RBC (CROSSMATCH)

## 2015-08-23 MED ORDER — POTASSIUM PHOSPHATES 15 MMOLE/5ML IV SOLN
15.0000 mmol | Freq: Once | INTRAVENOUS | Status: DC
  Filled 2015-08-23: qty 5

## 2015-08-23 MED ORDER — AMIODARONE HCL IN DEXTROSE 360-4.14 MG/200ML-% IV SOLN
30.0000 mg/h | INTRAVENOUS | Status: DC
  Filled 2015-08-23: qty 200

## 2015-08-23 MED ORDER — PANTOPRAZOLE SODIUM 40 MG IV SOLR
40.0000 mg | Freq: Two times a day (BID) | INTRAVENOUS | Status: DC
  Administered 2015-08-23 – 2015-08-29 (×13): 40 mg via INTRAVENOUS
  Filled 2015-08-23 (×13): qty 40

## 2015-08-23 MED ORDER — POTASSIUM PHOSPHATES 15 MMOLE/5ML IV SOLN
20.0000 mmol | Freq: Once | INTRAVENOUS | Status: AC
  Administered 2015-08-23: 20 mmol via INTRAVENOUS
  Filled 2015-08-23: qty 6.67

## 2015-08-23 MED ORDER — AMIODARONE LOAD VIA INFUSION
150.0000 mg | Freq: Once | INTRAVENOUS | Status: DC
  Filled 2015-08-23: qty 83.34

## 2015-08-23 MED ORDER — HYDROCORTISONE NA SUCCINATE PF 100 MG IJ SOLR
50.0000 mg | Freq: Three times a day (TID) | INTRAMUSCULAR | Status: DC
  Administered 2015-08-23 – 2015-08-24 (×4): 50 mg via INTRAVENOUS
  Filled 2015-08-23 (×4): qty 2

## 2015-08-23 MED ORDER — DEXTROSE 50 % IV SOLN
INTRAVENOUS | Status: AC
  Administered 2015-08-23: 25 mL
  Filled 2015-08-23: qty 50

## 2015-08-23 MED ORDER — SODIUM CHLORIDE 0.9 % IV SOLN: Freq: Once | INTRAVENOUS | Status: DC

## 2015-08-23 MED ORDER — AMIODARONE HCL IN DEXTROSE 360-4.14 MG/200ML-% IV SOLN
60.0000 mg/h | INTRAVENOUS | Status: DC
  Filled 2015-08-23: qty 200

## 2015-08-23 MED ORDER — MAGNESIUM SULFATE 4 GM/100ML IV SOLN
4.0000 g | Freq: Once | INTRAVENOUS | Status: AC
  Administered 2015-08-23: 4 g via INTRAVENOUS
  Filled 2015-08-23: qty 100

## 2015-08-23 NOTE — Progress Notes (Signed)
Per Dr. Wynelle Link change CRRT UF from 0 to 25. Pt vss, husband at bedside. will continue to monitor.

## 2015-08-23 NOTE — Progress Notes (Signed)
MEDICATION RELATED CONSULT NOTE - FOLLOW UP   Pharmacy Consult for electrolyte management   Labs:  Recent Labs  08/21/15 0839  08/22/15 0539  08/23/15 0337 08/23/15 0515 08/23/15 0804 08/23/15 1136 08/23/15 1527 08/23/15 1726  WBC  --   --  8.5  < > 16.8*  --  14.3*  --  12.9*  --   HGB  --   --  6.7*  < > 7.7*  --  7.4*  --  7.0*  --   HCT  --   --  21.0*  < > 23.6*  --  22.3*  --  21.4*  --   PLT  --   --  61*  < > 110*  --  101*  --  PLATELET CLUMPS NOTED ON SMEAR, UNABLE TO ESTIMATE  --   APTT 39*  --  42*  --   --  54*  --   --   --   --   CREATININE 0.61  < > 0.60  < >  --  0.51  0.55  --  0.44 0.43* 0.57  MG  --   < > 1.8  < >  --  1.7  --  1.5* 15.0* 2.4  PHOS 1.7*  < > 2.0*  < >  --  1.6*  --  <1.0* 2.1* 2.3*  2.3*  ALBUMIN 2.1*  < > 2.0*  < >  --  2.5*  --  2.1* 2.1* 2.0*  PROT  --   --  4.6*  --   --   --   --   --   --   --   AST  --   --  22  --   --   --   --   --   --   --   ALT  --   --  19  --   --   --   --   --   --   --   ALKPHOS  --   --  454*  --   --   --   --   --   --   --   BILITOT  --   --  0.6  --   --   --   --   --   --   --   < > = values in this interval not displayed. Estimated Creatinine Clearance: 95.9 mL/min (by C-G formula based on Cr of 0.57).    Assessment: Pharmacy was consulted to manage and replete electrolytes in this 49 year old female. Patient received magnesium sulfate 4 g IV x1 dose and potassium phosphate 20 mmol IV x1 dose this morning.   Follow up labs obtained at 1800 today: Mg 2.4 K 3.4 Phos 2.3  Goal of Therapy:  Electrolytes within normal limits  Plan:  Magnesium within normal limits, no replacement needed at this time  Will give potassium phosphate 15 mmol x1 dose and order follow up labs.   Of note, the CRRT dialysate bags were changed from the 2K to 4K bags today.  Jodelle Red Kendelle Schweers 08/23/2015,7:03 PM

## 2015-08-23 NOTE — Progress Notes (Signed)
   08/23/15 1330  Clinical Encounter Type  Visited With Patient and family together  Visit Type Follow-up;Other (Comment)  Consult/Referral To Chaplain  Spiritual Encounters  Spiritual Needs Other (Comment)  Stress Factors  Patient Stress Factors None identified  Family Stress Factors Other (Comment)  Chaplain rounded in the unit and offered a prayer shawl/blanket to the patient via the daughter. I offered the daughter a compassionate presence and our services. Patient and spouse were resting. Chaplain Levi Crass A. Bretta Fees Ext. [phone]

## 2015-08-23 NOTE — Progress Notes (Signed)
Received a call from [facility] regarding patient's hemoglobin post transfusion 6.1.  Spoke to Dr. Marchelle Gearing and orders received for 1 unit pRBCs.  MD made aware of patient's secretions that are bloody and frothy, no new orders received at this time.  Will continue to monitor.

## 2015-08-23 NOTE — Progress Notes (Signed)
[facility] [place] Critical Care Medicine Progess Note    ASSESSMENT/PLAN  Patient is status post Gastric bypass with sleeve gastrectomy March 2014 at [facility] complicated by SBO requiring lysis of adhesions with subsequent small bowel enterotomy resulting in shock and multiple cardiac arrests, chronic respiratory failure s/p tracheostomy, DVT/PE on coumadin, HTN, DM2, OSA, COPD, ESRD. Intra-abdominal abscesses s/p lap LOA and drainage 03/2013. Subsequently has experienced numerous infections and hospitalizations. Most recently on this admission. She is admitted for severe septic shock and acute kidney injury as well as decubitus ulcer. Her course is been compensated by ischemic stroke, likely cardioembolic. 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 History of obstructive sleep apnea History of COPD P:  Supplemental O2 to maintain SpO2 90-96% If requires vent support, will need to change trach to cuffed Currently doing well on 28% FiO2  -Patient is noted to have copious secretions with concomitant drop in her oxygen saturation. -I explained to the patient's husband that the patient needs to go for a tagged RBC scan and it would be safest to do this whilekeeping her on the ventilator.  CARDIOVASCULAR A:  Septic shock, Chronic steroids - possible component of adrenal insufficiency,currently on steroids. Episode of nonsustained ventricular tachycardia. VT; history of coronary artery disease, diastolic congestive heart failure, as well as monomorphic and polymorphic ventricular tachycardia. -Echocardiogram from 08/22/2015 results were reviewed; ejection fraction was 55-60%, pulmonary systolic pressure was 39 mmHg P:  MAP goal > 60 mmHg Taper stress dose steroids as permitted by BP -we'll start IV amiodarone.  RENAL A:  ESRD - now on  CRRT Hypokalemia, resolved P:  Monitor BMET intermittently Monitor I/Os Correct electrolytes as indicated Nephrollogy following.   GASTROINTESTINAL A:  Hx of C. Diff - PCR neg P:  I've ordered a tagged RBC scan and a vascular surgery consult to identify any potential source of bleeding from her gastrointestinal  Tract, and a coil embolization if indicated. The husband requested that the study be performed this evening, as he will be away from the hospital tomorrow, his daughter will be here, but he does not want anysignificant interventions performed while he is away.I have spoken with the radiology department and the test will be done this evening, I spoken with the critical care RN and with the husband. The patient will require the placement of a 20-gauge peripheral line, she will also have to be off of CRRT for 2 hours or more, I explained the potential repercussions of this. I also explained that the patient may develop respiratory distress. Therefore, it would be safest to perform the procedure while the patient is on a ventilator. I also explained the potential complications of being on the ventilator, which may include inability to wean from the ventilator. SUP: N/I unless requires vent support Tube feeds held due to bloody stools. Check c.diff PCR>>neg   HEMATOLOGIC A:  Acute on chronic anemia Thrombocytopenia, . Etiology unclear. Was on heparin PTA P:  DVT px: SCDs Monitor CBC intermittently Transfuse per usual guidelines Check HIT panel 9/25 - negative -will give platelet transfusions  INFECTIOUS A:  Severe sepsis Sacral decubitus ulcer with abscess History of decubitus ulcer with osteomyelitis. 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 P:  Monitor temp, WBC count Micro and abx as above  ENDOCRINE A:  DM  2, controlled Chronic prednisone therapy - chronic dose 10 mg BID Secondary adrenal insuff P:  CBGs and SSI - mod scale Stress dose steroids - wean as able based on BP  NEUROLOGIC A:  Acute encephalopathy, -somnolent still, suspect due to chronic sepsis with metabolic encephalopathy.  MRI suggest acute infarcts follow up Neuro recs -EEG pending RASS goal: 0 Minimize sedating meds - baseline mentation is talking and following commands.     ADMISSION DATE: 08/22/2015 CONSULTATION DATE: 9/23  INITIAL PRESENTATION:  49 F who has been in medical facilities (hosp, LTAC, rehab) for 2 yrs following gastric bypass surgery with multiple complications. Now with chronic trach, ESRD, profound debilitation, severe sacral pressure ulcer. Was seemingly making progress and transferred to rehab facility approx one week prior to this admission. She was sent to [facility] ED with AMS and hypotension. Working dx of severe sepsis/septic shock due to infected sacral pressure ulcer. Also has R side externalized HD cath and tunneled L IJ CVL. She has history of resistant bacterial infections. She is on chronic steroids. Her husband indicates that she has been diagnosed with adrenal insuff at some point during the past 2 yrs  MAJOR EVENTS/TEST RESULTS:  Admission 02/07/14-05/07/14 Admission 07/21/14-09/06/14 Discharged to [facility]. Pt had palliative consult at that time, were asked to sign off by husband.    9/23 CT head: NAD 9/23 EEG: no epileptiform activity 9/23 PRBCs for Hgb 6.4 9/24 bedside debridement of sacral wound. Abscess drained 9/25 Off vasopressors. More alert. No distress. Worsening thrombocytopenia. Vanc DC'd 9/29  Dr. Sampson [name] (I.D)excused from the case by patient's husband. 10/2 MRI -multiple infarcts 10/3 tracheal bleeding-will transfuse platelets 10/3 hospitalist service excused from the case by patient's husband 08/22/2015 Echocardiogram ejection fraction was 55-60%, pulmonary systolic  pressure was 39 mmHg  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 History of sepsis from C. glabrata MRSA PCR 9/23 >> NEG Urine 9/23 >>  Wound (swab) 9/23 >> many GNR, rare GPC >>  Blood 9/23 >> neg  Wound (debridement) 9/24 >> heavy GNR >> CRE, Enterococcus, K. Pneumoniae, P. Mirabilis, VRE CDiff 9/27>>neg  ANTIMICROBIALS:  Aztreonam 9/23 >> 9/24 Vanc 9/23 >> 9/25 Vanc 9/26>>9/27 Meropenem 9/23 >>  Daptomycin 9/27>>  MAJOR EVENTS/TEST RESULTS:   INDWELLING DEVICES::  MICRO DATA: MRSA PCR  Urine  Blood Resp   ANTIMICROBIALS:    ---------------------------------------   ----------------------------------------   Name: Courtney [name] MRN: [id number] DOB: 1966/05/04    ADMISSION DATE:  08-22-15    CHIEF COMPLAINT:  dyspnea     SUBJECTIVE:   Pt currently nonverbal, can not provide history or review of systems.   Review of Systems:  Pt currently nonverbal, can not provide history or review of systems.    VITAL SIGNS: Temp:  [97 F (36.1 C)-99.1 F (37.3 C)] 97 F (36.1 C) (10/04 1200) Pulse Rate:  [95-131] 102 (10/04 1500) Resp:  [0-33] 24 (10/04 1500) BP: (58-152)/(29-108) 124/62 mmHg (10/04 1500) SpO2:  [95 %-100 %] 100 % (10/04 1500) FiO2 (%):  [28 %-40 %] 40 % (10/04 1200) HEMODYNAMICS:   VENTILATOR SETTINGS: Vent Mode:  [-]  FiO2 (%):  [28 %-40 %] 40 % INTAKE / OUTPUT:  Intake/Output Summary (Last 24 hours) at 08/23/15 1513 Last data filed at 08/23/15 1450  Gross per 24  hour  Intake 2852.48 ml  Output    520 ml  Net 2332.48 ml    PHYSICAL EXAMINATION: Physical Examination:   VS: BP 124/62 mmHg  Pulse 102  Temp(Src) 97 F (36.1 C) (Axillary)  Resp 24  Ht  (1.753 m)  Wt 81.2 kg (179 lb 0.2 oz)   BMI 26.42 kg/m2  SpO2 100%  General Appearance: No distress  Neuro:without focal findings, mental status normal. HEENT: PERRLA, EOM intact. Pulmonary: normal breath sounds   CardiovascularNormal S1,S2.  No m/r/g.   Abdomen: Benign, Soft, non-tender. Renal:  No costovertebral tenderness  GU:  Not performed at this time. Endocrine: No evident thyromegaly. Skin:   warm, no rashes, no ecchymosis  Extremities: normal, no cyanosis, clubbing.   LABS:   LABORATORY PANEL:   CBC  Recent Labs Lab 08/23/15 0804  WBC 14.3*  HGB 7.4*  HCT 22.3*  PLT 101*    Chemistries   Recent Labs Lab 08/22/15 0539  08/23/15 1136  NA 139  < > 141  K 4.2  < > 2.8*  CL 105  < > 109  CO2 30  < > 27  GLUCOSE 126*  < > 53*  BUN 26*  < > 28*  CREATININE 0.60  < > 0.44  CALCIUM 8.0*  < > 7.6*  MG 1.8  < > 1.5*  PHOS 2.0*  < > <1.0*  AST 22  --   --   ALT 19  --   --   ALKPHOS 454*  --   --   BILITOT 0.6  --   --   < > = values in this interval not displayed.   Recent Labs Lab 08/23/15 08/23/15 0431 08/23/15 0735 08/23/15 1142 08/23/15 1159 08/23/15 1223  GLUCAP 202* 198* 111* 61* 39* 84    Recent Labs Lab 08/17/15 1154  PHART 7.48*  PCO2ART 45  PO2ART 111*    Recent Labs Lab 08/18/15 0507  08/22/15 0539  08/23/15 0210 08/23/15 0515 08/23/15 1136  AST 23  --  22  --   --   --   --   ALT 22  --  19  --   --   --   --   ALKPHOS 384*  --  454*  --   --   --   --   BILITOT 1.0  --  0.6  --   --   --   --   ALBUMIN 1.7*  < > 2.0*  < > 2.5* 2.5* 2.1*  < > = values in this interval not displayed.  Cardiac Enzymes No results for input(s): TROPONINI in the last 168 hours.  RADIOLOGY:  Mr Sherrin Daisy Contrast  08/21/2015   CLINICAL DATA:  48 year old hypertensive diabetic female with end-stage renal disease on dialysis presenting with altered mental status. Hypotensive and 08/09/2015. Subsequent encounter.  EXAM: MRI HEAD WITHOUT CONTRAST  TECHNIQUE: Multiplanar, multiecho  pulse sequences of the brain and surrounding structures were obtained without intravenous contrast.  COMPARISON:  07/25/2015 head CT.  01/28/2012 brain MR.  FINDINGS: Exam is significantly motion degraded.  Multiple acute infarcts. The largest infarct is within the left frontal lobe. Smaller infarct left parietal lobe. Scattered small infarcts noted in a parasagittal distribution right frontal -parietal lobe. Small infarcts occipital lobes and cerebellum bilaterally.  No intracranial hemorrhage.  No intracranial mass lesion noted on this unenhanced exam. (Evaluation limited by motion).  Global atrophy without hydrocephalus.  Limited evaluation of intracranial vasculature  given the degree of motion.  Osseous changes of anemia.  Mild spinal stenosis C3-4 and C4-5. Cervical medullary junction unremarkable. Small pituitary gland felt to be incidental finding.  IMPRESSION: Exam is significantly motion degraded.  Multiple acute infarcts. The largest infarct is within the left frontal lobe. Smaller infarct left parietal lobe. Scattered small infarcts noted in a parasagittal distribution right frontal -parietal lobe. Small infarcts occipital lobes and cerebellum bilaterally.  No intracranial hemorrhage.  Global atrophy without hydrocephalus.  Limited evaluation of intracranial vasculature given the degree of motion.  These results will be called to the ordering clinician or representative by the Radiologist Assistant, and communication documented in the PACS or zVision Dashboard.   Electronically Signed   By: Lacy Duverney M.D.   On: 08/21/2015 16:56   US Venous Img Upper Uni Right  08/22/2015   CLINICAL DATA:  49 year old female in the CCU with right arm swelling.  EXAM: RIGHT UPPER EXTREMITY VENOUS DOPPLER ULTRASOUND  TECHNIQUE: Gray-scale sonography with graded compression, as well as color Doppler and duplex ultrasound were performed to evaluate the upper extremity deep venous system from the level of the subclavian  vein and including the jugular, axillary, basilic, radial, ulnar and upper cephalic vein. Spectral Doppler was utilized to evaluate flow at rest and with distal augmentation maneuvers.  COMPARISON:  None.  FINDINGS: Contralateral Subclavian Vein: Respiratory phasicity is normal and symmetric with the symptomatic side. No evidence of thrombus. Normal compressibility.  Internal Jugular Vein: No evidence of thrombus. Normal compressibility. Loss of expected respiratory phasicity.  Subclavian Vein: No evidence of thrombus. Patency maintained. Loss of normal respiratory phasicity  Axillary Vein: No evidence of thrombus. Flow maintained. Loss of expected respiratory phasicity.  Limited visualization of cephalic vein, basilic vein, radial vein and ulnar vein.  Brachial vein: Flow maintained with no thrombus identified. Loss of phasicity.  IMPRESSION: Sonographic survey of the right upper extremity negative for DVT, however, loss of respiratory phasicity on the right is suggestive of a more central stenosis/occlusion in this patient with indwelling central catheters. Further evaluation for a more central stenosis/occlusion may be warranted, with potential imaging studies including a formal venogram or CT venogram.  These results were called by telephone at the time of interpretation on 08/22/2015 at 11:09 am to Dr. Belia Heman, who verbally acknowledged these results.  Signed,  Yvone Neu. Loreta Ave, DO  [facility]  [facility] Radiology   Electronically Signed   By: Gilmer Mor D.O.   On: 08/22/2015 11:12       --Wells Guiles, MD.   [facility]   Santiago Glad, M.D.  Stephanie Acre, M.D.  Billy Fischer, M.D  Critical Care Attestation.  I have personally obtained a history, examined the patient, evaluated laboratory and imaging results, formulated the assessment and plan and placed orders. The Patient requires high complexity decision making for assessment  and support, frequent evaluation and titration of therapies, application of advanced monitoring technologies and extensive interpretation of multiple databases. The patient has critical illness that could lead imminently to failure of 1 or more organ systems and requires the highest level of physician preparedness to intervene.  Critical Care Time devoted to patient care services described in this note is 90 minutes and is exclusive of time spent in procedures.

## 2015-08-23 NOTE — Progress Notes (Signed)
ELECTROLYTE MANAGEMENT - FOLLOW UP   Pharmacy Consult for Electrolyte Management Indication: electrolyte management   Allergies  Allergen Reactions  . Contrast Media [Iodinated Diagnostic Agents] Anaphylaxis  . Ampicillin Rash    Patient Measurements: Height:  (175.3 cm) Weight: 179 lb 0.2 oz (81.2 kg) IBW/kg (Calculated) : 66.2   Vital Signs: Temp: 97 F (36.1 C) (10/04 1200) Temp Source: Axillary (10/04 1200) BP: 142/53 mmHg (10/04 1200) Pulse Rate: 99 (10/04 1200) Intake/Output from previous day: 10/03 0701 - 10/04 0700 In: 3381.9 [I.V.:1037.9; ZOXWR:6045; NG/GT:660; IV Piggyback:410] Out: 400 [Stool:400] Intake/Output from this shift: Total I/O In: 662.2 [I.V.:62.2; IV Piggyback:600] Out: 72 [Other:72]  Labs:  Recent Labs  08/21/15 0839 08/21/15 2056 08/22/15 0539 08/22/15 2146 08/23/15 0337 08/23/15 0515 08/23/15 0804  WBC  --   --  8.5 17.5* 16.8*  --  14.3*  HGB  --   --  6.7* 6.1* 7.7*  --  7.4*  HCT  --   --  21.0* 19.5* 23.6*  --  22.3*  PLT  --   --  61* 127* 110*  --  101*  APTT 39*  --  42*  --   --  54*  --   INR  --  1.33  --   --   --   --   --      Recent Labs  08/22/15 0539  08/23/15 0210 08/23/15 0515 08/23/15 1136  NA 139  < > 139 140  139 141  K 4.2  < > 4.0 3.5  3.5 2.8*  CL 105  < > 108 107  108 109  CO2 30  < > GLUCOSE 126*  < > 254* 182*  178* 53*  BUN 26*  < > 32* 31*  32* 28*  CREATININE 0.60  < > 0.54 0.51  0.55 0.44  CALCIUM 8.0*  < > 7.9* 8.3*  7.9* 7.6*  MG 1.8  < > 1.6* 1.7 1.5*  PHOS 2.0*  < > 1.8* 1.6* <1.0*  PROT 4.6*  --   --   --   --   ALBUMIN 2.0*  < > 2.5* 2.5* 2.1*  AST 22  --   --   --   --   ALT 19  --   --   --   --   ALKPHOS 454*  --   --   --   --   BILITOT 0.6  --   --   --   --   < > = values in this interval not displayed. Estimated Creatinine Clearance: 95.9 mL/min (by C-G formula based on Cr of 0.44).    Recent Labs  08/23/15 1142 08/23/15 1159 08/23/15 1223   GLUCAP 61* 39* 84    Medical History: Past Medical History  Diagnosis Date  . Obesity   . Dyslipidemia   . Hypertension   . Coronary artery disease     s/p BMS 2010 LAD  . Dysrhythmia     ventricular tachycardia resolved after LAD stent and beta blocker  . Diabetes mellitus     with retinopathy, neuropathy and microalbuminemia  . ESRD (end stage renal disease) on dialysis     Medications:  Scheduled:  . acidophilus  1 capsule Oral Daily  . antiseptic oral rinse  7 mL Mouth Rinse q12n4p  . aspirin  324 mg Oral Daily  . budesonide (PULMICORT) nebulizer solution  0.5 mg Nebulization BID  . chlorhexidine  15 mL Mouth Rinse BID  . DAPTOmycin (CUBICIN)  IV  500 mg Intravenous Q48H  . epoetin (EPOGEN/PROCRIT) injection  20,000 Units Subcutaneous Weekly  . free water  100 mL Per Tube 3 times per day  . hydrocortisone sod succinate (SOLU-CORTEF) inj  50 mg Intravenous Q8H  . insulin aspart  0-15 Units Subcutaneous 6 times per day  . ipratropium-albuterol  3 mL Nebulization Q4H  . meropenem (MERREM) IV  1 g Intravenous Q12H  . midodrine  10 mg Oral TID WC  . multivitamin  5 mL Per Tube Daily  . nystatin   Topical TID  . pantoprazole (PROTONIX) IV  40 mg Intravenous Q12H  . potassium phosphate IVPB (mmol)  20 mmol Intravenous Once  . sodium hypochlorite   Irrigation BID    Assessment: 49 yo female admitted for sepsis, found to have hypokalemia. Pharmacy consulted for electrolyte management.  K= 2.8; Phos: <1; Mag: 1.5  Plan:  Patient received KPhos 20 mmol IV x 1. Will order Magnesium 4 g IV x 1. Will order another K, Phos, and Mag :00.   Pharmacy will continue to monitor and adjust per consult.    Ndeye Tenorio D, Pharm.D. Clinical Pharmacist 08/23/2015,2:31 PM

## 2015-08-23 NOTE — Progress Notes (Signed)
eLink Physician-Brief Progress Note Patient Name: Courtney [name] DOB: July 11, 1966 MRN: [id number]   Date of Service  08/23/2015  HPI/Events of Note  LGIB -dark blood 6.7 <>> 1 U >> 6.1>> 1 u  eICU Interventions  CBC q 6h Aspirate NG to chk for UGI source     Intervention Category Intermediate Interventions: Bleeding - evaluation and treatment with blood products  Sieara Bremer V. 08/23/2015, 12:47 AM

## 2015-08-23 NOTE — Progress Notes (Signed)
Subjective:   Husband at bedside.  Melanotic stools overnight. 2 PRBC unit and platelets yesterday.  Ordered dopamine but no administered yesterday Now on Norepinephrine Tube feeds are off  CRRT 2 K bath with no UF   Objective:  Vital signs in last 24 hours:  Temp:  [97.3 F (36.3 C)-99.1 F (37.3 C)] 97.4 F (36.3 C) (10/04 0800) Pulse Rate:  [96-131] 96 (10/04 0800) Resp:  [0-33] 20 (10/04 0800) BP: (41-152)/(24-108) 126/93 mmHg (10/04 0800) SpO2:  [95 %-100 %] 95 % (10/04 0821) FiO2 (%):  [28 %-40 %] 40 % (10/04 0821)  Weight change:  Filed Weights   08/19/15 1615 08/20/15 1600 08/22/15 0500  Weight: 85.1 kg (187 lb 9.8 oz) 80.3 kg (177 lb 0.5 oz) 81.2 kg (179 lb 0.2 oz)    Intake/Output: I/O last 3 completed shifts: In: 4151.9 [I.V.:1037.9; ZOXWR:6045; NG/GT:1280; IV Piggyback:560] Out: 1347 [Other:647; Stool:700]   Intake/Output this shift:  Total I/O In: 17 [I.V.:17] Out: -   Physical Exam: General: Critically ill  Head: Normocephalic, atraumatic. +NGT   Eyes: Eyes open, sluggish to light.   Neck: Trach in place, 100% O2, left IJ catheter  Lungs:  Scattered rhonchi, normal effort  Heart: tachycardia  Abdomen:  Soft, nontender, BS present  Extremities:  +right upper extremity edema, trace edema of left upper extremity and dependent  Neurologic: Lethargic, opens eyes to verbal commands  Skin: No lesions  Access: R IJ permcath    Basic Metabolic Panel:  Recent Labs Lab 08/22/15 1505 08/22/15 1809 08/22/15 2126 08/22/15 2146 08/23/15 0210 08/23/15 0515  NA 139 133*  --  137 139 140  139  K 4.3 6.0*  --  4.2 4.0 3.5  3.5  CL 105 102  --  107 108 107  108  CO2 24 23  --  GLUCOSE 206* 244*  --  244* 254* 182*  178*  BUN 26* 28*  --  33* 32* 31*  32*  CREATININE 0.55 0.55  --  0.62 0.54 0.51  0.55  CALCIUM 7.3* 7.6*  --  7.8* 7.9* 8.3*  7.9*  MG 1.6* 1.6* 1.6*  --  1.6* 1.7  PHOS 5.4* 2.4*  --  1.9* 1.8* 1.6*     Liver Function Tests:  Recent Labs Lab 08/18/15 0507  08/22/15 0539  08/22/15 1505 08/22/15 1809 08/22/15 2146 08/23/15 0210 08/23/15 0515  AST 23  --  22  --   --   --   --   --   --   ALT 22  --  19  --   --   --   --   --   --   ALKPHOS 384*  --  454*  --   --   --   --   --   --   BILITOT 1.0  --  0.6  --   --   --   --   --   --   PROT 4.4*  --  4.6*  --   --   --   --   --   --   ALBUMIN 1.7*  < > 2.0*  < > 2.0* 2.2* 2.2* 2.5* 2.5*  < > = values in this interval not displayed. No results for input(s): LIPASE, AMYLASE in the last 168 hours. No results for input(s): AMMONIA in the last 168 hours.  CBC:  Recent Labs Lab 08/17/15 0446  08/21/15 0417 08/22/15 0539 08/22/15  2146 08/23/15 0337 08/23/15 0804  WBC 6.7  < > 11.0 8.5 17.5* 16.8* 14.3*  NEUTROABS 5.4  --  9.2*  --   --   --   --   HGB 8.3*  < > 8.4* 6.7* 6.1* 7.7* 7.4*  HCT 25.5*  < > 25.9* 21.0* 19.5* 23.6* 22.3*  MCV 92.6  < > 94.2 93.9 90.0 88.4 88.4  PLT 17*  < > 60* 61* 127* 110* 101*  < > = values in this interval not displayed.  Cardiac Enzymes: No results for input(s): CKTOTAL, CKMB, CKMBINDEX, TROPONINI in the last 168 hours.  BNP: Invalid input(s): POCBNP  CBG:  Recent Labs Lab 08/22/15 1611 08/22/15 2012 08/23/15 08/23/15 0431 08/23/15 0735  GLUCAP 191* 174* 202* 198* 111*    Microbiology: Results for orders placed or performed during the hospital encounter of 08/28/15  Blood Culture (routine x 2)     Status: None   Collection Time: 2015-08-28  8:51 AM  Result Value Ref Range Status   Specimen Description BLOOD [name]  Final   Special Requests BOTTLES DRAWN AEROBIC AND ANAEROBIC  3CC  Final   Culture NO GROWTH 5 DAYS  Final   Report Status 08/17/2015 FINAL  Final  Blood Culture (routine x 2)     Status: None   Collection Time: 2015/08/28  9:20 AM  Result Value Ref Range Status   Specimen Description BLOOD LEFT ARM  Final   Special Requests BOTTLES DRAWN AEROBIC AND ANAEROBIC   1CC  Final   Culture NO GROWTH 5 DAYS  Final   Report Status 08/17/2015 FINAL  Final  Wound culture     Status: None   Collection Time: Aug 28, 2015  9:20 AM  Result Value Ref Range Status   Specimen Description DECUBITIS  Final   Special Requests Normal  Final   Gram Stain   Final    FEW WBC SEEN MANY GRAM NEGATIVE RODS RARE GRAM POSITIVE COCCI    Culture   Final    HEAVY GROWTH ESCHERICHIA COLI MODERATE GROWTH ENTEROBACTER AEROGENES PROTEUS MIRABILIS HEAVY GROWTH ENTEROCOCCUS SPECIES VRE HAVE INTRINSIC RESISTANCE TO MOST COMMONLY USED ANTIBIOTICS AND THE ABILITY TO ACQUIRE RESISTANCE TO MOST AVAILABLE ANTIBIOTICS.    Report Status 08/16/2015 FINAL  Final   Organism ID, Bacteria ESCHERICHIA COLI  Final   Organism ID, Bacteria ENTEROBACTER AEROGENES  Final   Organism ID, Bacteria PROTEUS MIRABILIS  Final   Organism ID, Bacteria ENTEROCOCCUS SPECIES  Final      Susceptibility   Enterobacter aerogenes - MIC*    CEFTAZIDIME <=1 SENSITIVE Sensitive     CEFAZOLIN >=64 RESISTANT Resistant     CEFTRIAXONE <=1 SENSITIVE Sensitive     CIPROFLOXACIN <=0.25 SENSITIVE Sensitive     GENTAMICIN <=1 SENSITIVE Sensitive     IMIPENEM 1 SENSITIVE Sensitive     TRIMETH/SULFA <=20 SENSITIVE Sensitive     * MODERATE GROWTH ENTEROBACTER AEROGENES   Escherichia coli - MIC*    AMPICILLIN <=2 SENSITIVE Sensitive     CEFTAZIDIME <=1 SENSITIVE Sensitive     CEFAZOLIN <=4 SENSITIVE Sensitive     CEFTRIAXONE <=1 SENSITIVE Sensitive     CIPROFLOXACIN <=0.25 SENSITIVE Sensitive     GENTAMICIN <=1 SENSITIVE Sensitive     IMIPENEM <=0.25 SENSITIVE Sensitive     TRIMETH/SULFA <=20 SENSITIVE Sensitive     * HEAVY GROWTH ESCHERICHIA COLI   Proteus mirabilis - MIC*    AMPICILLIN >=32 RESISTANT Resistant     CEFTAZIDIME <=1 SENSITIVE Sensitive  CEFAZOLIN 8 SENSITIVE Sensitive     CEFTRIAXONE <=1 SENSITIVE Sensitive     CIPROFLOXACIN <=0.25 SENSITIVE Sensitive     GENTAMICIN <=1 SENSITIVE Sensitive      IMIPENEM 1 SENSITIVE Sensitive     TRIMETH/SULFA <=20 SENSITIVE Sensitive     * PROTEUS MIRABILIS   Enterococcus species - MIC*    AMPICILLIN >=32 RESISTANT Resistant     VANCOMYCIN >=32 RESISTANT Resistant     GENTAMICIN SYNERGY SENSITIVE Sensitive     TETRACYCLINE Value in next row Resistant      RESISTANT>=16    * HEAVY GROWTH ENTEROCOCCUS SPECIES  MRSA PCR Screening     Status: None   Collection Time: 08/03/2015  2:38 PM  Result Value Ref Range Status   MRSA by PCR NEGATIVE NEGATIVE Final    Comment:        The GeneXpert MRSA Assay (FDA approved for NASAL specimens only), is one component of a comprehensive MRSA colonization surveillance program. It is not intended to diagnose MRSA infection nor to guide or monitor treatment for MRSA infections.   Blood culture (single)     Status: None   Collection Time: 07/26/2015  3:36 PM  Result Value Ref Range Status   Specimen Description BLOOD RIGHT ASSIST CONTROL  Final   Special Requests BOTTLES DRAWN AEROBIC AND ANAEROBIC  Final   Culture NO GROWTH 5 DAYS  Final   Report Status 08/17/2015 FINAL  Final  Wound culture     Status: None   Collection Time: 08/13/15 12:37 PM  Result Value Ref Range Status   Specimen Description WOUND  Final   Special Requests Normal  Final   Gram Stain   Final    FEW WBC SEEN TOO NUMEROUS TO COUNT GRAM NEGATIVE RODS FEW GRAM POSITIVE COCCI    Culture   Final    HEAVY GROWTH ESCHERICHIA COLI MODERATE GROWTH PROTEUS MIRABILIS LIGHT GROWTH KLEBSIELLA PNEUMONIAE MODERATE GROWTH ENTEROCOCCUS GALLINARUM CRITICAL RESULT CALLED TO, READ BACK BY AND VERIFIED WITH: [facility] BORBA AT 1042 08/16/15 DV    Report Status 08/17/2015 FINAL  Final   Organism ID, Bacteria ESCHERICHIA COLI  Final   Organism ID, Bacteria PROTEUS MIRABILIS  Final   Organism ID, Bacteria KLEBSIELLA PNEUMONIAE  Final   Organism ID, Bacteria ENTEROCOCCUS GALLINARUM  Final      Susceptibility   Escherichia coli - MIC*    AMPICILLIN  >=32 RESISTANT Resistant     CEFTAZIDIME 4 RESISTANT Resistant     CEFAZOLIN >=64 RESISTANT Resistant     CEFTRIAXONE 16 RESISTANT Resistant     GENTAMICIN 2 SENSITIVE Sensitive     IMIPENEM >=16 RESISTANT Resistant     TRIMETH/SULFA <=20 SENSITIVE Sensitive     Extended ESBL POSITIVE Resistant     PIP/TAZO Value in next row Resistant      RESISTANT>=128    CIPROFLOXACIN Value in next row Sensitive      SENSITIVE<=0.25    * HEAVY GROWTH ESCHERICHIA COLI   Klebsiella pneumoniae - MIC*    AMPICILLIN Value in next row Resistant      SENSITIVE<=0.25    CEFTAZIDIME Value in next row Resistant      SENSITIVE<=0.25    CEFAZOLIN Value in next row Resistant      SENSITIVE<=0.25    CEFTRIAXONE Value in next row Resistant      SENSITIVE<=0.25    CIPROFLOXACIN Value in next row Resistant      SENSITIVE<=0.25    GENTAMICIN Value in next row  Sensitive      SENSITIVE<=0.25    IMIPENEM Value in next row Resistant      SENSITIVE<=0.25    TRIMETH/SULFA Value in next row Resistant      SENSITIVE<=0.25    PIP/TAZO Value in next row Resistant      RESISTANT>=128    * LIGHT GROWTH KLEBSIELLA PNEUMONIAE   Proteus mirabilis - MIC*    AMPICILLIN Value in next row Resistant      RESISTANT>=128    CEFTAZIDIME Value in next row Sensitive      RESISTANT>=128    CEFAZOLIN Value in next row Sensitive      RESISTANT>=128    CEFTRIAXONE Value in next row Sensitive      RESISTANT>=128    CIPROFLOXACIN Value in next row Sensitive      RESISTANT>=128    GENTAMICIN Value in next row Sensitive      RESISTANT>=128    IMIPENEM Value in next row Sensitive      RESISTANT>=128    TRIMETH/SULFA Value in next row Sensitive      RESISTANT>=128    PIP/TAZO Value in next row Sensitive      SENSITIVE<=4    * MODERATE GROWTH PROTEUS MIRABILIS   Enterococcus gallinarum - MIC*    AMPICILLIN Value in next row Resistant      SENSITIVE<=4    GENTAMICIN SYNERGY Value in next row Sensitive      SENSITIVE<=4     CIPROFLOXACIN Value in next row Resistant      RESISTANT>=8    TETRACYCLINE Value in next row Resistant      RESISTANT>=16    * MODERATE GROWTH ENTEROCOCCUS GALLINARUM  Wound culture     Status: None   Collection Time: 08/15/15  2:53 PM  Result Value Ref Range Status   Specimen Description WOUND  Final   Special Requests NONE  Final   Gram Stain FEW WBC SEEN NO ORGANISMS SEEN   Final   Culture NO GROWTH 3 DAYS  Final   Report Status 08/18/2015 FINAL  Final  C difficile quick scan w PCR reflex     Status: None   Collection Time: 08/17/15 11:34 AM  Result Value Ref Range Status   C Diff antigen NEGATIVE NEGATIVE Final   C Diff toxin NEGATIVE NEGATIVE Final   C Diff interpretation Negative for C. difficile  Final    Coagulation Studies:  Recent Labs  08/21/15 2056  LABPROT 16.7*  INR 1.33    Urinalysis: No results for input(s): COLORURINE, LABSPEC, PHURINE, GLUCOSEU, HGBUR, BILIRUBINUR, KETONESUR, PROTEINUR, UROBILINOGEN, NITRITE, LEUKOCYTESUR in the last 72 hours.  Invalid input(s): APPERANCEUR    Imaging: Mr Sherrin Daisy Contrast  08/21/2015   CLINICAL DATA:  49 year old hypertensive diabetic female with end-stage renal disease on dialysis presenting with altered mental status. Hypotensive and 08-21-15. Subsequent encounter.  EXAM: MRI HEAD WITHOUT CONTRAST  TECHNIQUE: Multiplanar, multiecho pulse sequences of the brain and surrounding structures were obtained without intravenous contrast.  COMPARISON:  2015-08-21 head CT.  01/28/2012 brain MR.  FINDINGS: Exam is significantly motion degraded.  Multiple acute infarcts. The largest infarct is within the left frontal lobe. Smaller infarct left parietal lobe. Scattered small infarcts noted in a parasagittal distribution right frontal -parietal lobe. Small infarcts occipital lobes and cerebellum bilaterally.  No intracranial hemorrhage.  No intracranial mass lesion noted on this unenhanced exam. (Evaluation limited by motion).   Global atrophy without hydrocephalus.  Limited evaluation of intracranial vasculature given the degree of motion.  Osseous changes of anemia.  Mild spinal stenosis C3-4 and C4-5. Cervical medullary junction unremarkable. Small pituitary gland felt to be incidental finding.  IMPRESSION: Exam is significantly motion degraded.  Multiple acute infarcts. The largest infarct is within the left frontal lobe. Smaller infarct left parietal lobe. Scattered small infarcts noted in a parasagittal distribution right frontal -parietal lobe. Small infarcts occipital lobes and cerebellum bilaterally.  No intracranial hemorrhage.  Global atrophy without hydrocephalus.  Limited evaluation of intracranial vasculature given the degree of motion.  These results will be called to the ordering clinician or representative by the Radiologist Assistant, and communication documented in the PACS or zVision Dashboard.   Electronically Signed   By: Lacy Duverney M.D.   On: 08/21/2015 16:56   US Venous Img Upper Uni Right  08/22/2015   CLINICAL DATA:  49 year old female in the CCU with right arm swelling.  EXAM: RIGHT UPPER EXTREMITY VENOUS DOPPLER ULTRASOUND  TECHNIQUE: Gray-scale sonography with graded compression, as well as color Doppler and duplex ultrasound were performed to evaluate the upper extremity deep venous system from the level of the subclavian vein and including the jugular, axillary, basilic, radial, ulnar and upper cephalic vein. Spectral Doppler was utilized to evaluate flow at rest and with distal augmentation maneuvers.  COMPARISON:  None.  FINDINGS: Contralateral Subclavian Vein: Respiratory phasicity is normal and symmetric with the symptomatic side. No evidence of thrombus. Normal compressibility.  Internal Jugular Vein: No evidence of thrombus. Normal compressibility. Loss of expected respiratory phasicity.  Subclavian Vein: No evidence of thrombus. Patency maintained. Loss of normal respiratory phasicity  Axillary  Vein: No evidence of thrombus. Flow maintained. Loss of expected respiratory phasicity.  Limited visualization of cephalic vein, basilic vein, radial vein and ulnar vein.  Brachial vein: Flow maintained with no thrombus identified. Loss of phasicity.  IMPRESSION: Sonographic survey of the right upper extremity negative for DVT, however, loss of respiratory phasicity on the right is suggestive of a more central stenosis/occlusion in this patient with indwelling central catheters. Further evaluation for a more central stenosis/occlusion may be warranted, with potential imaging studies including a formal venogram or CT venogram.  These results were called by telephone at the time of interpretation on 08/22/2015 at 11:09 am to Dr. Belia Heman, who verbally acknowledged these results.  Signed,  Yvone Neu. Loreta Ave, DO  [facility]  [facility]   Electronically Signed   By: Gilmer Mor D.O.   On: 08/22/2015 11:12     Medications:   . norepinephrine (LEVOPHED) Adult infusion 12 mcg/min (08/23/15 0630)  . pureflow 1,500 mL/hr at 08/23/15 0740   . acidophilus  1 capsule Oral Daily  . antiseptic oral rinse  7 mL Mouth Rinse q12n4p  . aspirin  324 mg Oral Daily  . budesonide (PULMICORT) nebulizer solution  0.5 mg Nebulization BID  . chlorhexidine  15 mL Mouth Rinse BID  . DAPTOmycin (CUBICIN)  IV  500 mg Intravenous Q48H  . epoetin (EPOGEN/PROCRIT) injection  20,000 Units Subcutaneous Weekly  . free water  100 mL Per Tube 3 times per day  . hydrocortisone sod succinate (SOLU-CORTEF) inj  50 mg Intravenous Q8H  . insulin aspart  0-15 Units Subcutaneous 6 times per day  . ipratropium-albuterol  3 mL Nebulization Q4H  . meropenem (MERREM) IV  1 g Intravenous Q12H  . midodrine  10 mg Oral TID WC  . multivitamin  5 mL Per Tube Daily  . nystatin   Topical TID  . sodium  hypochlorite   Irrigation BID   sodium chloride, sodium chloride, alteplase, anticoagulant sodium  citrate, lidocaine (PF), lidocaine-prilocaine, pentafluoroprop-tetrafluoroeth  Assessment/ Plan:  49 y.o. 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  1. End-stage renal disease on hemodialysis on HD TTHS. The patient has been on dialysis since October of 2014.  R IJ permcath. However unable to tolerate ultrafiltration with intermittent hemodialysis. Requiring vasopressors. Has been placed on CRRT since Sunday 10/2. Currently tolerating well. However unable to get much ultrafiltration.  - Start minimal ultrafiltration.  - 2 K bath, hyperkalemia yesterday.  - Continue CRRT due to hemodynamic instability  2. Anemia of ZOX:WRUE transfusion 2 units on 10/3 and platelet transfusion. Hemoglobin 7.4. Platelets improved 101. - continue EPO 20000 weekly.  - now with GI bleed.   3. Secondary hyperparathyroidism: PTH low at 55. -  phos 1.6: IV phos ordered.   - low due to poor PO intake, loss of muscle mass and CRRT clearance.   4. Septic shock: Multiple potential sources, has two catheters in place, also has a sacral decubitis, and also has trach in place. WBC 14.3 (16.8) - multiple organisms growing via wound culture - appreciate ID recommendations: currently on meropenem and daptomycin.   Consider Palliative Care. Overall prognosis is poor. Discussed case with husband.    LOS: 11 Davona Kinoshita 10/4/20169:19 AM

## 2015-08-23 NOTE — Progress Notes (Signed)
Patient's flexiseal noted with new dark red stool.  Dr. Vassie Loll made aware.  Orders received to hold tube feeds and aspirate dobhoff to check for upper GI bleeding.  Will check hemoglobin post transfusion and continue to monitor.

## 2015-08-23 NOTE — Progress Notes (Signed)
S/w wound care rn regarding wound care orders expiring, per her instructions reordered  for 14 days.

## 2015-08-23 NOTE — Progress Notes (Signed)
S/w dr. Damien Fusi, advised him that pt k+ was 3.5 this morning. Inquired about crrt settings. Per dr, leave ultrafiltration off and keep 2k+ bath at this time. Dr will assess pt later this morning.

## 2015-08-23 NOTE — Progress Notes (Signed)
Recd call from lab, pt mag level resulted at 15. Order submitted for redraw due to pt having mag running at the time of lab draw.

## 2015-08-23 NOTE — Progress Notes (Signed)
Mag repeat resulted at 2.4 wdl

## 2015-08-23 NOTE — Progress Notes (Signed)
12:05 pt bs 31, recheck verified bs was accurate. rn administered  d50 per hypoglycemic protocol.  12:25 pt bs 84. Dr Ardyth Man informed of bs event. No other interventions at this time. Will continue to monitor. Pt husband and daughter at bedside.

## 2015-08-23 NOTE — Progress Notes (Signed)
Nutrition Follow-up    INTERVENTION:   Coordination of Care: discussed nutritional poc during ICU rounds with MD Serita Grit RN. TF to remain on hold at present, GI to be consulted for GI bleeding. No plans for re-initiation of TF at present;  If unable to reinitiate TF via dobhoff tube, may need to consider initiation of TPN. Continue to assess   NUTRITION DIAGNOSIS:   Inadequate oral intake related to wound healing, acute illness, chronic illness as evidenced by NPO status, estimated needs. Continues  GOAL:   Patient will meet greater than or equal to 90% of their needs  MONITOR:    (Energy Intake, Anthropometrics, Digestive System, Electrolyte/Renal Profile, Glucose Profile)  REASON FOR ASSESSMENT:   Consult Enteral/tube feeding initiation and management  ASSESSMENT:    Pt with dark red stool via flexiseal, drop in Hgb, concern for GI bleed with GI consult pending. TF on hold due to possible GI bleed. 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)  Electrolyte and Renal Profile:  Recent Labs Lab 08/23/15 0210 08/23/15 0515 08/23/15 1136  BUN 32* 31*  32* 28*  CREATININE 0.54 0.51  0.55 0.44  NA 139 140  139 141  K 4.0 3.5  3.5 2.8*  MG 1.6* 1.7 1.5*  PHOS 1.8* 1.6* <1.0*   Glucose Profile:  Recent Labs  08/23/15 1142 08/23/15 1159 08/23/15 1223  GLUCAP 61* 39* 84   Meds: reviewed  Height:   Ht Readings from Last 1 Encounters:  07/29/2015  (1.753 m)    Weight:   Wt Readings from Last 1 Encounters:  08/22/15 179 lb 0.2 oz (81.2 kg)    Filed Weights   08/19/15 1615 08/20/15 1600 08/22/15 0500  Weight: 187 lb 9.8 oz (85.1 kg) 177 lb 0.5 oz (80.3 kg) 179 lb 0.2 oz (81.2 kg)    BMI:  Body mass index is 26.42 kg/(m^2).  Estimated Nutritional Needs:   Kcal:  4098-1191 kcals (BEE 1434, 1.2 AF, 1.2-1.4 IF)   Protein:  113-150 g/kg  (1.5-2.0 g/kg) but may be closer to 150-188 g (2.0-2.5 g/kg)   Fluid:  1000 mL plus UOP  HIGH Care Level  Romelle Starcher MS, RD, LDN [phone] Pager

## 2015-08-23 NOTE — Progress Notes (Signed)
Pt k+ 2.8, s/w dr. Wynelle Link, per his instructions pt dialysate changed to 4k+

## 2015-08-23 NOTE — Progress Notes (Signed)
   08/23/15 1601  Clinical Encounter Type  Visited With Family  Visit Type Follow-up  Consult/Referral To Chaplain  Spiritual Encounters  Spiritual Needs Emotional  Stress Factors  Family Stress Factors Health changes  Chaplain rounded in the unit and went back to follow up with patient's spouse. Received an update and provided a listening and encouraging ear. He appreciated the prayer blanket and the support. Chaplain Chandi Nicklin A. Fouad Taul Ext. [phone]

## 2015-08-23 NOTE — Progress Notes (Signed)
ELECTROLYTE MANAGEMENT - FOLLOW UP   Pharmacy Consult for Electrolyte Management Indication: hypokalemia  Allergies  Allergen Reactions  . Contrast Media [Iodinated Diagnostic Agents] Anaphylaxis  . Ampicillin Rash    Patient Measurements: Height:  (175.3 cm) Weight: 179 lb 0.2 oz (81.2 kg) IBW/kg (Calculated) : 66.2   Vital Signs: Temp: 97.4 F (36.3 C) (10/04 0800) Temp Source: Axillary (10/04 0800) BP: 126/93 mmHg (10/04 0800) Pulse Rate: 96 (10/04 0800) Intake/Output from previous day: 10/03 0701 - 10/04 0700 In: 3381.9 [I.V.:1037.9; WUJWJ:1914; NG/GT:660; IV Piggyback:410] Out: 400 [Stool:400] Intake/Output from this shift: Total I/O In: 17 [I.V.:17] Out: -   Labs:  Recent Labs  08/21/15 0839 08/21/15 2056 08/22/15 0539 08/22/15 2146 08/23/15 0337 08/23/15 0515 08/23/15 0804  WBC  --   --  8.5 17.5* 16.8*  --  14.3*  HGB  --   --  6.7* 6.1* 7.7*  --  7.4*  HCT  --   --  21.0* 19.5* 23.6*  --  22.3*  PLT  --   --  61* 127* 110*  --  101*  APTT 39*  --  42*  --   --  54*  --   INR  --  1.33  --   --   --   --   --      Recent Labs  08/22/15 0539  08/22/15 2126 08/22/15 2146 08/23/15 0210 08/23/15 0515  NA 139  < >  --  137 139 140  139  K 4.2  < >  --  4.2 4.0 3.5  3.5  CL 105  < >  --  107 108 107  108  CO2 30  < >  --  GLUCOSE 126*  < >  --  244* 254* 182*  178*  BUN 26*  < >  --  33* 32* 31*  32*  CREATININE 0.60  < >  --  0.62 0.54 0.51  0.55  CALCIUM 8.0*  < >  --  7.8* 7.9* 8.3*  7.9*  MG 1.8  < > 1.6*  --  1.6* 1.7  PHOS 2.0*  < >  --  1.9* 1.8* 1.6*  PROT 4.6*  --   --   --   --   --   ALBUMIN 2.0*  < >  --  2.2* 2.5* 2.5*  AST 22  --   --   --   --   --   ALT 19  --   --   --   --   --   ALKPHOS 454*  --   --   --   --   --   BILITOT 0.6  --   --   --   --   --   < > = values in this interval not displayed. Estimated Creatinine Clearance: 95.9 mL/min (by C-G formula based on Cr of 0.51).    Recent  Labs  08/23/15 08/23/15 0431 08/23/15 0735  GLUCAP 202* 198* 111*    Medical History: Past Medical History  Diagnosis Date  . Obesity   . Dyslipidemia   . Hypertension   . Coronary artery disease     s/p BMS 2010 LAD  . Dysrhythmia     ventricular tachycardia resolved after LAD stent and beta blocker  . Diabetes mellitus     with retinopathy, neuropathy and microalbuminemia  . ESRD (end stage renal disease) on dialysis  Medications:  Scheduled:  . acidophilus  1 capsule Oral Daily  . antiseptic oral rinse  7 mL Mouth Rinse q12n4p  . aspirin  324 mg Oral Daily  . budesonide (PULMICORT) nebulizer solution  0.5 mg Nebulization BID  . chlorhexidine  15 mL Mouth Rinse BID  . DAPTOmycin (CUBICIN)  IV  500 mg Intravenous Q48H  . epoetin (EPOGEN/PROCRIT) injection  20,000 Units Subcutaneous Weekly  . free water  100 mL Per Tube 3 times per day  . hydrocortisone sod succinate (SOLU-CORTEF) inj  50 mg Intravenous Q8H  . insulin aspart  0-15 Units Subcutaneous 6 times per day  . ipratropium-albuterol  3 mL Nebulization Q4H  . meropenem (MERREM) IV  1 g Intravenous Q12H  . midodrine  10 mg Oral TID WC  . multivitamin  5 mL Per Tube Daily  . nystatin   Topical TID  . potassium phosphate IVPB (mmol)  20 mmol Intravenous Once  . sodium hypochlorite   Irrigation BID    Assessment: 49 yo female admitted for sepsis, found to have hypokalemia. Pharmacy consulted for electrolyte management.  K: 3.5   MD Kolluru gave Kphos 20 mmol IV x 1.   Plan:  All labs within normal limits.   Pharmacy will continue to monitor and adjust per consult.    Rhemi Balbach D, Pharm.D. Clinical Pharmacist 08/23/2015,9:39 AM

## 2015-08-23 NOTE — Progress Notes (Signed)
Increased amount of dark red stool with clots noted in and around flexiseal.  Dr. Vassie Loll made aware.  Orders received to draw another hemoglobin now and will have rounding MD consult GI today.  Levophed at 16mcg/min.  ST on cardiac monitor.  40% on trach collar.  Suctioned 5-6 times throughout the night with frothy, bloody secretions.  Patient appears to be resting more comfortably now.  Will continue to assess and monitor.

## 2015-08-23 NOTE — Progress Notes (Signed)
NEUROLOGY NOTE  S: Pt now has GI bleed and is on pressors as well.  Decreased interaction per husband today  ROS unobtainable to aphasia  O: 99.1   142.53   99   19 Moderate distress, overweight Normocephalic, oropharynx clear Supple, no JVD CTA B, no wheezing tachycardic, no murmur Diffuse edema, no C/C  Trached, not sedated, opens to pain but does not track or follow, GCS 4T Pupils 4mm and sluggish, weak R corneal, good L corneal, R droop No response to pain today  A/P: 1. Multiple embolic acute infarcts- Etiology still appears to be cardioembolic to me but cant r/o DIC as cause. This will still cause permanent deficits of R hemiparesis and aphasia which will not likely get better 2. Encephalopathy- Worsened today which is likely due to worsening infection and GI bleed -  Will start neurostimulation once pt is stable from GI standpoint - Discussed possibility of DIC with Dr. Belia Heman and await Hem/Onc opinion on this - Still needs TEE when stable but now pt cant be anticoagulated anyhow - Check blood cultures again - Hold ASA for now due to GI Bleed - Try to keep Hgb > 8 - Keep MAP > 60 for good perfusion - Antibiotics per ID but would cover for possible endocarditis - Will still follow with you

## 2015-08-23 NOTE — Progress Notes (Signed)
At 11:33 Pt had 3 beat run of v tach followed by bigimany. Pt now ST. Dr. Ardyth Man made aware of event. No intervention at this time. Copy of strip in chart.

## 2015-08-23 NOTE — Progress Notes (Signed)
Pharmacy Consult for Daptomycin/Meropenem Indication: Sepsis/Sacral decubitus  Allergies  Allergen Reactions  . Contrast Media [Iodinated Diagnostic Agents] Anaphylaxis  . Ampicillin Rash    Patient Measurements: Height: 5\' 9"  (175.3 cm) Weight: 179 lb 0.2 oz (81.2 kg) IBW/kg (Calculated) : 66.2   Vital Signs: Temp: 97.4 F (36.3 C) (10/04 0800) Temp Source: Axillary (10/04 0800) BP: 126/93 mmHg (10/04 0800) Pulse Rate: 96 (10/04 0800) Intake/Output from previous day: 10/03 0701 - 10/04 0700 In: 3381.9 [I.V.:1037.9; ZOXWR:6045; NG/GT:660; IV Piggyback:410] Out: 400 [Stool:400] Intake/Output from this shift: Total I/O In: 17 [I.V.:17] Out: -   Labs:  Recent Labs  08/22/15 2146 08/23/15 0210 08/23/15 0337 08/23/15 0515 08/23/15 0804  WBC 17.5*  --  16.8*  --  14.3*  HGB 6.1*  --  7.7*  --  7.4*  PLT 127*  --  110*  --  101*  CREATININE 0.62 0.54  --  0.51  0.55  --    Estimated Creatinine Clearance: 95.9 mL/min (by C-G formula based on Cr of 0.51). No results for input(s): VANCOTROUGH, VANCOPEAK, VANCORANDOM, GENTTROUGH, GENTPEAK, GENTRANDOM, TOBRATROUGH, TOBRAPEAK, TOBRARND, AMIKACINPEAK, AMIKACINTROU, AMIKACIN in the last 72 hours.   Microbiology: Recent Results (from the past 720 hour(s))  Culture, blood (routine x 2)     Status: None   Collection Time: 08/01/15  7:00 PM  Result Value Ref Range Status   Specimen Description BLOOD RIGHT ANTECUBITAL  Final   Special Requests BOTTLES DRAWN AEROBIC ONLY 5CC  Final   Culture NO GROWTH 5 DAYS  Final   Report Status 08/06/2015 FINAL  Final  Culture, blood (routine x 2)     Status: None   Collection Time: 08/01/15  7:10 PM  Result Value Ref Range Status   Specimen Description BLOOD RIGHT HAND  Final   Special Requests IN PEDIATRIC BOTTLE 3CC  Final   Culture  Setup Time   Final    GRAM POSITIVE COCCI IN CLUSTERS AEROBIC BOTTLE ONLY CRITICAL RESULT CALLED TO, READ BACK BY AND VERIFIED WITH: D TAYLOR,RN AT 1115  08/02/15 BY L BENFIELD    Culture STAPHYLOCOCCUS SPECIES (COAGULASE NEGATIVE)  Final   Report Status 08/06/2015 FINAL  Final   Organism ID, Bacteria STAPHYLOCOCCUS SPECIES (COAGULASE NEGATIVE)  Final      Susceptibility   Staphylococcus species (coagulase negative) - MIC*    CIPROFLOXACIN >=8 RESISTANT Resistant     ERYTHROMYCIN <=0.25 SENSITIVE Sensitive     GENTAMICIN 8 INTERMEDIATE Intermediate     OXACILLIN >=4 RESISTANT Resistant     TETRACYCLINE <=1 SENSITIVE Sensitive     VANCOMYCIN 1 SENSITIVE Sensitive     TRIMETH/SULFA 160 RESISTANT Resistant     CLINDAMYCIN <=0.25 SENSITIVE Sensitive     RIFAMPIN <=0.5 SENSITIVE Sensitive     Inducible Clindamycin NEGATIVE Sensitive     * STAPHYLOCOCCUS SPECIES (COAGULASE NEGATIVE)  Culture, blood (routine x 2)     Status: None   Collection Time: 08/02/15  7:00 PM  Result Value Ref Range Status   Specimen Description BLOOD RIGHT ANTECUBITAL  Final   Special Requests BOTTLES DRAWN AEROBIC ONLY 10CC  Final   Culture  Setup Time   Final    GRAM POSITIVE COCCI IN CLUSTERS AEROBIC BOTTLE ONLY CRITICAL RESULT CALLED TO, READ BACK BY AND VERIFIED WITH: DR Gwenevere Abbot RN 1549 08/03/15 A BROWNING    Culture   Final    STAPHYLOCOCCUS SPECIES (COAGULASE NEGATIVE) SUSCEPTIBILITIES PERFORMED ON PREVIOUS CULTURE WITHIN THE LAST 5 DAYS.    Report Status  08/06/2015 FINAL  Final  Culture, blood (routine x 2)     Status: None   Collection Time: 08/02/15  7:05 PM  Result Value Ref Range Status   Specimen Description BLOOD RIGHT HAND  Final   Special Requests BOTTLES DRAWN AEROBIC AND ANAEROBIC 10CC  Final   Culture NO GROWTH 5 DAYS  Final   Report Status 08/07/2015 FINAL  Final  Blood Culture (routine x 2)     Status: None   Collection Time: 08/15/2015  8:51 AM  Result Value Ref Range Status   Specimen Description BLOOD UNKO  Final   Special Requests BOTTLES DRAWN AEROBIC AND ANAEROBIC  3CC  Final   Culture NO GROWTH 5 DAYS  Final   Report Status  08/17/2015 FINAL  Final  Blood Culture (routine x 2)     Status: None   Collection Time: 08/17/2015  9:20 AM  Result Value Ref Range Status   Specimen Description BLOOD LEFT ARM  Final   Special Requests BOTTLES DRAWN AEROBIC AND ANAEROBIC  1CC  Final   Culture NO GROWTH 5 DAYS  Final   Report Status 08/17/2015 FINAL  Final  Wound culture     Status: None   Collection Time: 08/15/2015  9:20 AM  Result Value Ref Range Status   Specimen Description DECUBITIS  Final   Special Requests Normal  Final   Gram Stain   Final    FEW WBC SEEN MANY GRAM NEGATIVE RODS RARE GRAM POSITIVE COCCI    Culture   Final    HEAVY GROWTH ESCHERICHIA COLI MODERATE GROWTH ENTEROBACTER AEROGENES PROTEUS MIRABILIS HEAVY GROWTH ENTEROCOCCUS SPECIES VRE HAVE INTRINSIC RESISTANCE TO MOST COMMONLY USED ANTIBIOTICS AND THE ABILITY TO ACQUIRE RESISTANCE TO MOST AVAILABLE ANTIBIOTICS.    Report Status 08/16/2015 FINAL  Final   Organism ID, Bacteria ESCHERICHIA COLI  Final   Organism ID, Bacteria ENTEROBACTER AEROGENES  Final   Organism ID, Bacteria PROTEUS MIRABILIS  Final   Organism ID, Bacteria ENTEROCOCCUS SPECIES  Final      Susceptibility   Enterobacter aerogenes - MIC*    CEFTAZIDIME <=1 SENSITIVE Sensitive     CEFAZOLIN >=64 RESISTANT Resistant     CEFTRIAXONE <=1 SENSITIVE Sensitive     CIPROFLOXACIN <=0.25 SENSITIVE Sensitive     GENTAMICIN <=1 SENSITIVE Sensitive     IMIPENEM 1 SENSITIVE Sensitive     TRIMETH/SULFA <=20 SENSITIVE Sensitive     * MODERATE GROWTH ENTEROBACTER AEROGENES   Escherichia coli - MIC*    AMPICILLIN <=2 SENSITIVE Sensitive     CEFTAZIDIME <=1 SENSITIVE Sensitive     CEFAZOLIN <=4 SENSITIVE Sensitive     CEFTRIAXONE <=1 SENSITIVE Sensitive     CIPROFLOXACIN <=0.25 SENSITIVE Sensitive     GENTAMICIN <=1 SENSITIVE Sensitive     IMIPENEM <=0.25 SENSITIVE Sensitive     TRIMETH/SULFA <=20 SENSITIVE Sensitive     * HEAVY GROWTH ESCHERICHIA COLI   Proteus mirabilis - MIC*     AMPICILLIN >=32 RESISTANT Resistant     CEFTAZIDIME <=1 SENSITIVE Sensitive     CEFAZOLIN 8 SENSITIVE Sensitive     CEFTRIAXONE <=1 SENSITIVE Sensitive     CIPROFLOXACIN <=0.25 SENSITIVE Sensitive     GENTAMICIN <=1 SENSITIVE Sensitive     IMIPENEM 1 SENSITIVE Sensitive     TRIMETH/SULFA <=20 SENSITIVE Sensitive     * PROTEUS MIRABILIS   Enterococcus species - MIC*    AMPICILLIN >=32 RESISTANT Resistant     VANCOMYCIN >=32 RESISTANT Resistant  GENTAMICIN SYNERGY SENSITIVE Sensitive     TETRACYCLINE Value in next row Resistant      RESISTANT>=16    * HEAVY GROWTH ENTEROCOCCUS SPECIES  MRSA PCR Screening     Status: None   Collection Time: 09-09-15  2:38 PM  Result Value Ref Range Status   MRSA by PCR NEGATIVE NEGATIVE Final    Comment:        The GeneXpert MRSA Assay (FDA approved for NASAL specimens only), is one component of a comprehensive MRSA colonization surveillance program. It is not intended to diagnose MRSA infection nor to guide or monitor treatment for MRSA infections.   Blood culture (single)     Status: None   Collection Time: 2015/09/09  3:36 PM  Result Value Ref Range Status   Specimen Description BLOOD RIGHT ASSIST CONTROL  Final   Special Requests BOTTLES DRAWN AEROBIC AND ANAEROBIC  Final   Culture NO GROWTH 5 DAYS  Final   Report Status 08/17/2015 FINAL  Final  Wound culture     Status: None   Collection Time: 08/13/15 12:37 PM  Result Value Ref Range Status   Specimen Description WOUND  Final   Special Requests Normal  Final   Gram Stain   Final    FEW WBC SEEN TOO NUMEROUS TO COUNT GRAM NEGATIVE RODS FEW GRAM POSITIVE COCCI    Culture   Final    HEAVY GROWTH ESCHERICHIA COLI MODERATE GROWTH PROTEUS MIRABILIS LIGHT GROWTH KLEBSIELLA PNEUMONIAE MODERATE GROWTH ENTEROCOCCUS GALLINARUM CRITICAL RESULT CALLED TO, READ BACK BY AND VERIFIED WITH: [facility] BORBA AT 1042 08/16/15 DV    Report Status 08/17/2015 FINAL  Final   Organism ID, Bacteria  ESCHERICHIA COLI  Final   Organism ID, Bacteria PROTEUS MIRABILIS  Final   Organism ID, Bacteria KLEBSIELLA PNEUMONIAE  Final   Organism ID, Bacteria ENTEROCOCCUS GALLINARUM  Final      Susceptibility   Escherichia coli - MIC*    AMPICILLIN >=32 RESISTANT Resistant     CEFTAZIDIME 4 RESISTANT Resistant     CEFAZOLIN >=64 RESISTANT Resistant     CEFTRIAXONE 16 RESISTANT Resistant     GENTAMICIN 2 SENSITIVE Sensitive     IMIPENEM >=16 RESISTANT Resistant     TRIMETH/SULFA <=20 SENSITIVE Sensitive     Extended ESBL POSITIVE Resistant     PIP/TAZO Value in next row Resistant      RESISTANT>=128    CIPROFLOXACIN Value in next row Sensitive      SENSITIVE<=0.25    * HEAVY GROWTH ESCHERICHIA COLI   Klebsiella pneumoniae - MIC*    AMPICILLIN Value in next row Resistant      SENSITIVE<=0.25    CEFTAZIDIME Value in next row Resistant      SENSITIVE<=0.25    CEFAZOLIN Value in next row Resistant      SENSITIVE<=0.25    CEFTRIAXONE Value in next row Resistant      SENSITIVE<=0.25    CIPROFLOXACIN Value in next row Resistant      SENSITIVE<=0.25    GENTAMICIN Value in next row Sensitive      SENSITIVE<=0.25    IMIPENEM Value in next row Resistant      SENSITIVE<=0.25    TRIMETH/SULFA Value in next row Resistant      SENSITIVE<=0.25    PIP/TAZO Value in next row Resistant      RESISTANT>=128    * LIGHT GROWTH KLEBSIELLA PNEUMONIAE   Proteus mirabilis - MIC*    AMPICILLIN Value in next row Resistant  RESISTANT>=128    CEFTAZIDIME Value in next row Sensitive      RESISTANT>=128    CEFAZOLIN Value in next row Sensitive      RESISTANT>=128    CEFTRIAXONE Value in next row Sensitive      RESISTANT>=128    CIPROFLOXACIN Value in next row Sensitive      RESISTANT>=128    GENTAMICIN Value in next row Sensitive      RESISTANT>=128    IMIPENEM Value in next row Sensitive      RESISTANT>=128    TRIMETH/SULFA Value in next row Sensitive      RESISTANT>=128    PIP/TAZO Value in next  row Sensitive      SENSITIVE<=4    * MODERATE GROWTH PROTEUS MIRABILIS   Enterococcus gallinarum - MIC*    AMPICILLIN Value in next row Resistant      SENSITIVE<=4    GENTAMICIN SYNERGY Value in next row Sensitive      SENSITIVE<=4    CIPROFLOXACIN Value in next row Resistant      RESISTANT>=8    TETRACYCLINE Value in next row Resistant      RESISTANT>=16    * MODERATE GROWTH ENTEROCOCCUS GALLINARUM  Wound culture     Status: None   Collection Time: 08/15/15  2:53 PM  Result Value Ref Range Status   Specimen Description WOUND  Final   Special Requests NONE  Final   Gram Stain FEW WBC SEEN NO ORGANISMS SEEN   Final   Culture NO GROWTH 3 DAYS  Final   Report Status 08/18/2015 FINAL  Final  C difficile quick scan w PCR reflex     Status: None   Collection Time: 08/17/15 11:34 AM  Result Value Ref Range Status   C Diff antigen NEGATIVE NEGATIVE Final   C Diff toxin NEGATIVE NEGATIVE Final   C Diff interpretation Negative for C. difficile  Final    Medical History: Past Medical History  Diagnosis Date  . Obesity   . Dyslipidemia   . Hypertension   . Coronary artery disease     s/p BMS 2010 LAD  . Dysrhythmia     ventricular tachycardia resolved after LAD stent and beta blocker  . Diabetes mellitus     with retinopathy, neuropathy and microalbuminemia  . ESRD (end stage renal disease) on dialysis     Medications:  Scheduled:  . acidophilus  1 capsule Oral Daily  . antiseptic oral rinse  7 mL Mouth Rinse q12n4p  . aspirin  324 mg Oral Daily  . budesonide (PULMICORT) nebulizer solution  0.5 mg Nebulization BID  . chlorhexidine  15 mL Mouth Rinse BID  . DAPTOmycin (CUBICIN)  IV  500 mg Intravenous Q48H  . epoetin (EPOGEN/PROCRIT) injection  20,000 Units Subcutaneous Weekly  . free water  100 mL Per Tube 3 times per day  . hydrocortisone sod succinate (SOLU-CORTEF) inj  50 mg Intravenous Q8H  . insulin aspart  0-15 Units Subcutaneous 6 times per day  .  ipratropium-albuterol  3 mL Nebulization Q4H  . meropenem (MERREM) IV  1 g Intravenous Q12H  . midodrine  10 mg Oral TID WC  . multivitamin  5 mL Per Tube Daily  . nystatin   Topical TID  . potassium phosphate IVPB (mmol)  20 mmol Intravenous Once  . sodium hypochlorite   Irrigation BID   Infusions:  . norepinephrine (LEVOPHED) Adult infusion 12 mcg/min (08/23/15 0630)  . pureflow 1,500 mL/hr at 08/23/15 0740   PRN: sodium  chloride, sodium chloride, alteplase, anticoagulant sodium citrate, lidocaine (PF), lidocaine-prilocaine, pentafluoroprop-tetrafluoroeth  Assessment: 49 y/o F with ESRD on CRRT with sepsis likely from stage IV sacral decubitus with wound growing multiple MDR organisms including VRE. Patient is on day 11 of antibiotics (day 7 of daptomycin and day 11 of meropenem).  Goal of Therapy:  Resolution of infection  Plan:   Patient recently converted to CRRT. Will continue daptomycin 6 mg/kg q 48 hours and meropenem 1000 mg IV Q12 hours.   Courtney [name] D 08/23/2015,9:35 AM

## 2015-08-23 NOTE — Consult Note (Signed)
GI Inpatient Consult Note  Reason for Consult:  GI bleed   Attending Requesting Consult: Nicholos Johns  History of Present Illness: Courtney [name] is a 49 y.o. female with a h/o HTN, CAD, DM 2, OSA, ESRD on dialysis, anemia of CKD, h/o of cardiac arrest, and respiratory failure w/ trach placement admitted for unresponsiveness, hypotension, sepsis, altered mental status.  Patient has many possible sources for infection, including 2 catheters places, decubitus ulcers, and tracheostomy.  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    Courtney Niemann, RN in ICU, provided the history due to patient's critical condition.  She reported the patient's RN overnight was performing her assessment around midnight and noticed dark blood seeping through patient's undergarments and gown, around her abdomen.  She has a flexseal in place, and the nurse also noted dark blood and clots in tubing and filling the canister.  Bloody secretions were also suctioned from trach. MD was made aware and tube feeds held, GI consulted in the morning.  She notes that patient did not experience dark/bloody stools or secretions prior to last evening.  Patient was transfused 2 units PRBC yesterday due to Hgb 6.1.  No dark stools noted at that time.  Hgb improved to 7.7 on recheck prior to bleed, 7.4 after bleeding noticed.  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  Anticoagulation and ASA held due to bleeding.  Past Medical History:  Past Medical History  Diagnosis Date  . Obesity   . Dyslipidemia   . Hypertension   . Coronary artery disease     s/p BMS 2010 LAD  . Dysrhythmia     ventricular tachycardia resolved after LAD stent and beta blocker  . Diabetes mellitus     with retinopathy, neuropathy and  microalbuminemia  . ESRD (end stage renal disease) on dialysis     Problem List: Patient Active Problem List   Diagnosis Date Noted  . Decubitus ulcer   . Septic shock (HCC) 07/21/2015  . Pressure ulcer 08/15/2015  . Acute on chronic respiratory failure (HCC) 06/27/2015  . Tracheostomy status (HCC) 06/27/2015  . Paroxysmal ventricular tachycardia-non sustained  01/28/2012  . CAD (coronary artery disease) prior LAD BMS 01/28/2012  . Renal insufficiency 01/28/2012  . Chronic diastolic heart failure (HCC) 01/28/2012    Past Surgical History: Past Surgical History  Procedure Laterality Date  . Cholecystectomy  1990  . Pci lad  12/2010  . Colonoscopy  08/2011  . Left heart catheterization with coronary angiogram N/A 01/28/2012    Procedure: LEFT HEART CATHETERIZATION WITH CORONARY ANGIOGRAM;  Surgeon: Lesleigh Noe, MD;  Location: [facility] CATH LAB;  Service: Cardiovascular;  Laterality: N/A;  . Tracheostomy    . Gastric bypass      Allergies: Allergies  Allergen Reactions  . Contrast Media [Iodinated Diagnostic Agents] Anaphylaxis  . Ampicillin Rash    Home Medications: Prescriptions prior to admission  Medication Sig Dispense Refill Last Dose  . b complex-vitamin c-folic acid (NEPHRO-VITE) 0.8 MG TABS tablet Take 1 tablet by mouth every evening.   unknown  . carvedilol (COREG) 25 MG tablet Take 25 mg by mouth every evening.   unknown  . collagenase (SANTYL) ointment Apply 1 application topically daily.   unknown  . Darbepoetin Alfa (ARANESP) 100 MCG/0.5ML SOSY injection Inject 100 mcg into the skin every 7 (seven) days. On  Saturday at dialysis   unknown  . escitalopram (LEXAPRO) 5 MG tablet Take 5 mg by mouth at bedtime.   unknown  . heparin 5000 UNIT/ML injection Inject 5,000 Units into the skin every 8 (eight) hours.   unknown  . insulin lispro (HUMALOG) 100 UNIT/ML injection Inject into the skin 4 (four) times daily -  before meals and at bedtime. Per sliding scale   unknown  .  insulin lispro (HUMALOG) 100 UNIT/ML injection Inject into the skin 3 (three) times daily. Per sliding scale   unknown  . lidocaine (LIDODERM) 5 % Place 1 patch onto the skin daily. Remove & Discard patch within 12 hours or as directed by MD   unknown  . lisinopril (PRINIVIL,ZESTRIL) 40 MG tablet Take 40 mg by mouth every evening.   unknown  . megestrol (MEGACE) 400 MG/10ML suspension Take 800 mg by mouth 2 (two) times daily.   unknown  . mirtazapine (REMERON) 15 MG tablet Take 15 mg by mouth at bedtime.   unknown  . oxyCODONE (OXY IR/ROXICODONE) 5 MG immediate release tablet Take 5 mg by mouth every 8 (eight) hours.   unknown  . oxyCODONE (OXY IR/ROXICODONE) 5 MG immediate release tablet Take 5 mg by mouth every 8 (eight) hours as needed for moderate pain.   PRN  . potassium chloride SA (K-DUR,KLOR-CON) 20 MEQ tablet Take 20 mEq by mouth every evening.   unknown  . predniSONE (DELTASONE) 10 MG tablet Take 10 mg by mouth 2 (two) times daily.   unknown  . psyllium (METAMUCIL) 58.6 % powder Take 1 packet by mouth 2 (two) times daily.   unknown  . thiamine (VITAMIN B-1) 100 MG tablet Take 100 mg by mouth every evening.   unknown  . vancomycin (VANCOCIN) 50 mg/mL oral solution Take 125 mg by mouth every evening.   unknown   Home medication reconciliation was completed with the patient.   Scheduled Inpatient Medications:   . sodium chloride   Intravenous Once  . acidophilus  1 capsule Oral Daily  . antiseptic oral rinse  7 mL Mouth Rinse q12n4p  . aspirin  324 mg Oral Daily  . budesonide (PULMICORT) nebulizer solution  0.5 mg Nebulization BID  . chlorhexidine  15 mL Mouth Rinse BID  . DAPTOmycin (CUBICIN)  IV  500 mg Intravenous Q48H  . epoetin (EPOGEN/PROCRIT) injection  20,000 Units Subcutaneous Weekly  . free water  100 mL Per Tube 3 times per day  . hydrocortisone sod succinate (SOLU-CORTEF) inj  50 mg Intravenous Q8H  . insulin aspart  0-15 Units Subcutaneous 6 times per day  .  ipratropium-albuterol  3 mL Nebulization Q4H  . magnesium sulfate 1 - 4 g bolus IVPB  4 g Intravenous Once  . meropenem (MERREM) IV  1 g Intravenous Q12H  . midodrine  10 mg Oral TID WC  . multivitamin  5 mL Per Tube Daily  . nystatin   Topical TID  . pantoprazole (PROTONIX) IV  40 mg Intravenous Q12H  . potassium phosphate IVPB (mmol)  20 mmol Intravenous Once  . sodium hypochlorite   Irrigation BID    Continuous Inpatient Infusions:   . norepinephrine (LEVOPHED) Adult infusion 12 mcg/min (08/23/15 0630)  . pureflow 1,500 mL/hr at 08/23/15 0740    PRN Inpatient Medications:  sodium chloride, sodium chloride, alteplase, anticoagulant sodium citrate, lidocaine (PF), lidocaine-prilocaine, pentafluoroprop-tetrafluoroeth  Family History: family history includes Cancer in her maternal grandmother and mother; Diabetes in her father, paternal grandfather, and paternal grandmother;  Hypertension in her father, paternal grandfather, and paternal grandmother.  Social History:   reports that she has never smoked. She has never used smokeless tobacco. She reports that she does not drink alcohol or use illicit drugs.  Review of Systems: Unable to perform, patient    Physical Examination: BP 124/62 mmHg  Pulse 102  Temp(Src) 97 F (36.1 C) (Axillary)  Resp 24  Ht 5\' 9"  (1.753 m)  Wt 81.2 kg (179 lb 0.2 oz)  BMI 26.42 kg/m2  SpO2 100% Gen: Critically ill, trached, not sedated, unresponsive to commands, maroon blood present in flexseal tubing and canister  HEENT: PEERLA Neck: supple, no JVD or thyromegaly Chest: CTA bilaterally, no wheezes, crackles, or other adventitious sounds CV: RRR, no m/g/c/r Abd: soft, ND, +BS in all four quadrants; no HSM, ridigity Ext: diffuse edema in upper and lower extremities bilaterally, 2+ pulses Skin: no rash or lesions noted, hemodialysis catheter in place Lymph: no LAD  Data: Lab Results  Component Value Date   WBC 14.3* 08/23/2015   HGB 7.4*  08/23/2015   HCT 22.3* 08/23/2015   MCV 88.4 08/23/2015   PLT 101* 08/23/2015    Recent Labs Lab 08/22/15 2146 08/23/15 0337 08/23/15 0804  HGB 6.1* 7.7* 7.4*   Lab Results  Component Value Date   NA 141 08/23/2015   K 2.8* 08/23/2015   CL 109 08/23/2015   CO2 27 08/23/2015   BUN 28* 08/23/2015   CREATININE 0.44 08/23/2015   Lab Results  Component Value Date   ALT 19 08/22/2015   AST 22 08/22/2015   ALKPHOS 454* 08/22/2015   BILITOT 0.6 08/22/2015    Recent Labs Lab 08/21/15 2056  08/23/15 0515  APTT  --   < > 54*  INR 1.33  --   --   < > = values in this interval not displayed. Assessment/Plan: Courtney [name] is a 49 y.o. female with multiple complex medical issues, admitted with unresponsiveness, hypotension, sepsis, altered mental status.  Dark blood and clots were noticed seeping through patient's undergarments and via flexseal overnight.  Hgb appears stable at this time, though she received 2 units PRBC yesterday for anemia of CKD.  Suspect bleeding is likely due to rectal ulcer, would consider a possible flex sig but performing the bowel prep with enemas likely difficult given patient's condition.  Procedure likely also complicated by sacral decubitus ulcer.  At this time, also recommend holding off on tagged scan as active bleeding is not significant; proceed with scan if bleeding worsens.  Continue to monitor Hgb and transfuse if <7.  Will continue to follow.  Recommendations: - Monitor Hgb, transfuse if <7 - Consider flex sig if bleeding persists or Hgb drops, though colon prep and procedure likely difficult given patient's condition - Hold off on tagged scan for now, proceed with scan if significant bleeding occurs  Thank you for the consult. We will follow along with you. Please call with questions or concerns.  Burman Freestone, PA-C [facility] Phone: [phone] Pager: [phone]

## 2015-08-23 NOTE — Progress Notes (Addendum)
I spoke with gastroenterology was also spoken with the husband, felt that the GI bleeding may be secondary to a rectal ulcer secondary to theplacement of a rectal tube. Recommended that we DC the rectal tube and monitor bleeding, and hold off on the tagged bleeding scan for now. I spoke with radiology and the bleeding scan has been canceled.  Critical Care Attestation.  I have personally obtained a history, examined the patient, evaluated laboratory and imaging results, formulated the assessment and plan and placed orders. The Patient requires high complexity decision making for assessment and support, frequent evaluation and titration of therapies, application of advanced monitoring technologies and extensive interpretation of multiple databases. The patient has critical illness that could lead imminently to failure of 1 or more organ systems and requires the highest level of physician preparedness to intervene.  Critical Care Time devoted to patient care services described in this note is 20 minutes and is exclusive of time spent in procedures.

## 2015-08-24 ENCOUNTER — Inpatient Hospital Stay: Payer: 59

## 2015-08-24 LAB — RENAL FUNCTION PANEL
ALBUMIN: 2.1 g/dL — AB (ref 3.5–5.0)
ANION GAP: 2 — AB (ref 5–15)
ANION GAP: 3 — AB (ref 5–15)
Albumin: 2.1 g/dL — ABNORMAL LOW (ref 3.5–5.0)
Albumin: 2.1 g/dL — ABNORMAL LOW (ref 3.5–5.0)
Albumin: 2.1 g/dL — ABNORMAL LOW (ref 3.5–5.0)
Albumin: 1.9 g/dL — ABNORMAL LOW (ref 3.5–5.0)
Anion gap: 5 (ref 5–15)
Anion gap: 4 — ABNORMAL LOW (ref 5–15)
Anion gap: 7 (ref 5–15)
BUN: 28 mg/dL — AB (ref 6–20)
BUN: 23 mg/dL — ABNORMAL HIGH (ref 6–20)
BUN: 21 mg/dL — AB (ref 6–20)
BUN: 16 mg/dL (ref 6–20)
BUN: 15 mg/dL (ref 6–20)
CALCIUM: 7.6 mg/dL — AB (ref 8.9–10.3)
CALCIUM: 7.8 mg/dL — AB (ref 8.9–10.3)
CALCIUM: 7.7 mg/dL — AB (ref 8.9–10.3)
CHLORIDE: 109 mmol/L (ref 101–111)
CHLORIDE: 108 mmol/L (ref 101–111)
CHLORIDE: 107 mmol/L (ref 101–111)
CO2: 27 mmol/L (ref 22–32)
CO2: 28 mmol/L (ref 22–32)
CO2: 27 mmol/L (ref 22–32)
CO2: 24 mmol/L (ref 22–32)
CO2: 27 mmol/L (ref 22–32)
CREATININE: 0.44 mg/dL (ref 0.44–1.00)
CREATININE: 0.48 mg/dL (ref 0.44–1.00)
CREATININE: 0.46 mg/dL (ref 0.44–1.00)
Calcium: 7.8 mg/dL — ABNORMAL LOW (ref 8.9–10.3)
Calcium: 7.4 mg/dL — ABNORMAL LOW (ref 8.9–10.3)
Chloride: 99 mmol/L — ABNORMAL LOW (ref 101–111)
Chloride: 109 mmol/L (ref 101–111)
Creatinine, Ser: 0.53 mg/dL (ref 0.44–1.00)
Creatinine, Ser: 0.54 mg/dL (ref 0.44–1.00)
GFR calc Af Amer: >60 mL/min (ref >60)
GFR calc Af Amer: >60 mL/min (ref >60)
GFR calc Af Amer: >60 mL/min (ref >60)
GFR calc non Af Amer: >60 mL/min (ref >60)
GFR calc non Af Amer: >60 mL/min (ref >60)
GFR calc non Af Amer: >60 mL/min (ref >60)
GLUCOSE: 74 mg/dL (ref 65–99)
Glucose, Bld: 53 mg/dL — ABNORMAL LOW (ref 65–99)
Glucose, Bld: 88 mg/dL (ref 65–99)
Glucose, Bld: 76 mg/dL (ref 65–99)
Glucose, Bld: 214 mg/dL — ABNORMAL HIGH (ref 65–99)
PHOSPHORUS: 8.6 mg/dL — AB (ref 2.5–4.6)
Phosphorus: <1 mg/dL — CL (ref 2.5–4.6)
Phosphorus: 1.9 mg/dL — ABNORMAL LOW (ref 2.5–4.6)
Phosphorus: 1.8 mg/dL — ABNORMAL LOW (ref 2.5–4.6)
Phosphorus: 2.5 mg/dL (ref 2.5–4.6)
Potassium: 2.8 mmol/L — CL (ref 3.5–5.1)
Potassium: 3.6 mmol/L (ref 3.5–5.1)
Potassium: 4.2 mmol/L (ref 3.5–5.1)
Potassium: 6.6 mmol/L (ref 3.5–5.1)
Potassium: 4.3 mmol/L (ref 3.5–5.1)
SODIUM: 141 mmol/L (ref 135–145)
SODIUM: 138 mmol/L (ref 135–145)
SODIUM: 139 mmol/L (ref 135–145)
Sodium: 138 mmol/L (ref 135–145)
Sodium: 130 mmol/L — ABNORMAL LOW (ref 135–145)

## 2015-08-24 LAB — BASIC METABOLIC PANEL
Anion gap: 5 (ref 5–15)
BUN: 21 mg/dL — AB (ref 6–20)
CHLORIDE: 107 mmol/L (ref 101–111)
CO2: 27 mmol/L (ref 22–32)
CREATININE: 0.43 mg/dL — AB (ref 0.44–1.00)
Calcium: 8.1 mg/dL — ABNORMAL LOW (ref 8.9–10.3)
Glucose, Bld: 74 mg/dL (ref 65–99)
POTASSIUM: 4.3 mmol/L (ref 3.5–5.1)
SODIUM: 139 mmol/L (ref 135–145)

## 2015-08-24 LAB — CBC WITH DIFFERENTIAL/PLATELET
Basophils Absolute: 0 10*3/uL (ref 0–0.1)
Basophils Relative: 0 %
Eosinophils Absolute: 0 10*3/uL (ref 0–0.7)
HEMATOCRIT: 23.7 % — AB (ref 35.0–47.0)
Hemoglobin: 8 g/dL — ABNORMAL LOW (ref 12.0–16.0)
LYMPHS ABS: 0.6 10*3/uL — AB (ref 1.0–3.6)
MCH: 28.8 pg (ref 26.0–34.0)
MCHC: 33.5 g/dL (ref 32.0–36.0)
MCV: 86.1 fL (ref 80.0–100.0)
Monocytes Absolute: 0.6 10*3/uL (ref 0.2–0.9)
Monocytes Relative: 7 %
NEUTROS ABS: 7 10*3/uL — AB (ref 1.4–6.5)
Neutrophils Relative %: 86 %
Platelets: 76 10*3/uL — ABNORMAL LOW (ref 150–440)
RBC: 2.76 MIL/uL — AB (ref 3.80–5.20)
RDW: 18.7 % — ABNORMAL HIGH (ref 11.5–14.5)
WBC: 8.2 10*3/uL (ref 3.6–11.0)

## 2015-08-24 LAB — GLUCOSE, CAPILLARY
Glucose-Capillary: 65 mg/dL (ref 65–99)
Glucose-Capillary: 67 mg/dL (ref 65–99)
Glucose-Capillary: 68 mg/dL (ref 65–99)
Glucose-Capillary: 71 mg/dL (ref 65–99)
Glucose-Capillary: 72 mg/dL (ref 65–99)
Glucose-Capillary: 73 mg/dL (ref 65–99)
Glucose-Capillary: 75 mg/dL (ref 65–99)
Glucose-Capillary: 83 mg/dL (ref 65–99)
Glucose-Capillary: 85 mg/dL (ref 65–99)
Glucose-Capillary: 98 mg/dL (ref 65–99)

## 2015-08-24 LAB — PREPARE PLATELET PHERESIS: Unit division: 0

## 2015-08-24 LAB — CBC
HEMATOCRIT: 24.6 % — AB (ref 35.0–47.0)
HEMOGLOBIN: 8.3 g/dL — AB (ref 12.0–16.0)
MCH: 28.7 pg (ref 26.0–34.0)
MCHC: 33.7 g/dL (ref 32.0–36.0)
MCV: 85 fL (ref 80.0–100.0)
Platelets: 87 10*3/uL — ABNORMAL LOW (ref 150–440)
RBC: 2.89 MIL/uL — ABNORMAL LOW (ref 3.80–5.20)
RDW: 18.8 % — ABNORMAL HIGH (ref 11.5–14.5)
WBC: 9.4 10*3/uL (ref 3.6–11.0)

## 2015-08-24 LAB — APTT: APTT: 43 s — AB (ref 24–36)

## 2015-08-24 LAB — MAGNESIUM
MAGNESIUM: 1.9 mg/dL (ref 1.7–2.4)
MAGNESIUM: 1.9 mg/dL (ref 1.7–2.4)
Magnesium: 2.1 mg/dL (ref 1.7–2.4)
Magnesium: 2 mg/dL (ref 1.7–2.4)

## 2015-08-24 LAB — PHOSPHORUS: PHOSPHORUS: 1.7 mg/dL — AB (ref 2.5–4.6)

## 2015-08-24 MED ORDER — HYDROCORTISONE NA SUCCINATE PF 100 MG IJ SOLR
25.0000 mg | Freq: Three times a day (TID) | INTRAMUSCULAR | Status: DC
  Administered 2015-08-24 – 2015-08-29 (×15): 25 mg via INTRAVENOUS
  Filled 2015-08-24 (×15): qty 2

## 2015-08-24 MED ORDER — SODIUM CHLORIDE 0.9 % IV SOLN
650.0000 mg | INTRAVENOUS | Status: DC
  Administered 2015-08-24: 650 mg via INTRAVENOUS
  Filled 2015-08-24 (×2): qty 13

## 2015-08-24 MED ORDER — POTASSIUM PHOSPHATES 15 MMOLE/5ML IV SOLN
20.0000 mmol | Freq: Once | INTRAVENOUS | Status: AC
  Administered 2015-08-24: 20 mmol via INTRAVENOUS
  Filled 2015-08-24: qty 6.67

## 2015-08-24 MED ORDER — POTASSIUM CHLORIDE 20 MEQ/15ML (10%) PO SOLN
40.0000 meq | Freq: Once | ORAL | Status: AC
  Administered 2015-08-24: 40 meq
  Filled 2015-08-24: qty 30

## 2015-08-24 MED ORDER — IPRATROPIUM-ALBUTEROL 0.5-2.5 (3) MG/3ML IN SOLN
3.0000 mL | Freq: Two times a day (BID) | RESPIRATORY_TRACT | Status: DC
  Administered 2015-08-24 – 2015-08-31 (×14): 3 mL via RESPIRATORY_TRACT
  Filled 2015-08-24 (×15): qty 3

## 2015-08-24 NOTE — Progress Notes (Signed)
MD paged for critical potassium 6.6. Spoke to Dr. Wynelle Link regarding results.  Orders received to switch dialysate fluid from 4K to 2K.  Per Dr. Wynelle Link, do not give patient potassium replacement; he will adjust dialysate fluid accordingly upon lab results.  Will continue to assess and monitor.

## 2015-08-24 NOTE — Progress Notes (Signed)
Recd call from lab. Phos level yesterday was <1, repeat phos was 2.4. Critical value was not reported to rn. Phos level this morning was 1.7,replacement ordered.

## 2015-08-24 NOTE — Progress Notes (Signed)
Vascular consult not indicated at this time will be available if bleeding scan shows the colon as the source  Rectal ulcer appears to be the source which is not amenable to embolization, stress gastritis is also a consideration

## 2015-08-24 NOTE — Progress Notes (Signed)
ELECTROLYTE MANAGEMENT - FOLLOW UP   Pharmacy Consult for Electrolyte Management Indication: electrolyte management   Allergies  Allergen Reactions  . Contrast Media [Iodinated Diagnostic Agents] Anaphylaxis  . Ampicillin Rash    Patient Measurements: Height:  (175.3 cm) Weight: 179 lb 0.2 oz (81.2 kg) IBW/kg (Calculated) : 66.2   Vital Signs: Temp: 97.8 F (36.6 C) (10/05 0600) Temp Source: Axillary (10/05 0600) BP: 100/88 mmHg (10/05 0630) Pulse Rate: 102 (10/05 0630) Intake/Output from previous day: 10/04 0701 - 10/05 0700 In: 1610.9 [I.V.:164.9; Blood:646; IV Piggyback:800] Out: 983 [Stool:500] Intake/Output from this shift: Total I/O In: 466.4 [I.V.:46.4; Blood:320; IV Piggyback:100] Out: 242 [Other:242]  Labs:  Recent Labs  08/21/15 2056 08/22/15 0539  08/23/15 0515  08/23/15 1527 08/23/15 2145 08/24/15 0540 08/24/15 0543  WBC  --  8.5  < >  --   < > 12.9* 11.2*  --  9.4  HGB  --  6.7*  < >  --   < > 7.0* 8.8*  --  8.3*  HCT  --  21.0*  < >  --   < > 21.4* 27.0*  --  24.6*  PLT  --  61*  < >  --   < > PLATELET CLUMPS NOTED ON SMEAR, UNABLE TO ESTIMATE 84*  --  87*  APTT  --  42*  --  54*  --   --   --  43*  --   INR 1.33  --   --   --   --   --   --   --   --   < > = values in this interval not displayed.   Recent Labs  08/22/15 0539  08/23/15 1726 08/23/15 2145 08/24/15 0141 08/24/15 0540  NA 139  < > 138 137 138 139  138  K 4.2  < > 3.4*  3.4* 3.5 3.6 4.3  4.2  CL 105  < > 105 106 108 107  107  CO2 30  < > GLUCOSE 126*  < > 97 98 88 74  76  BUN 26*  < > 26* 23* 23* 21*  21*  CREATININE 0.60  < > 0.57 0.48 0.53 0.43*  0.48  CALCIUM 8.0*  < > 8.0* 8.0* 7.8* 8.1*  7.8*  MG 1.8  < > 2.4 2.2 2.1  --   PHOS 2.0*  < > 2.3*  2.3* 1.9* 1.9* 1.7*  1.8*  PROT 4.6*  --   --   --   --   --   ALBUMIN 2.0*  < > 2.0* 2.1* 2.1* 2.1*  AST 22  --   --   --   --   --   ALT 19  --   --   --   --   --   ALKPHOS 454*  --   --    --   --   --   BILITOT 0.6  --   --   --   --   --   < > = values in this interval not displayed. Estimated Creatinine Clearance: 95.9 mL/min (by C-G formula based on Cr of 0.43).    Recent Labs  08/23/15 1604 08/23/15 2003 08/24/15 0338  GLUCAP 84 94 73    Medical History: Past Medical History  Diagnosis Date  . Obesity   . Dyslipidemia   . Hypertension   . Coronary artery disease     s/p  BMS 2010 LAD  . Dysrhythmia     ventricular tachycardia resolved after LAD stent and beta blocker  . Diabetes mellitus     with retinopathy, neuropathy and microalbuminemia  . ESRD (end stage renal disease) on dialysis     Medications:  Scheduled:  . acidophilus  1 capsule Oral Daily  . antiseptic oral rinse  7 mL Mouth Rinse q12n4p  . aspirin  324 mg Oral Daily  . budesonide (PULMICORT) nebulizer solution  0.5 mg Nebulization BID  . chlorhexidine  15 mL Mouth Rinse BID  . DAPTOmycin (CUBICIN)  IV  500 mg Intravenous Q48H  . epoetin (EPOGEN/PROCRIT) injection  20,000 Units Subcutaneous Weekly  . free water  100 mL Per Tube 3 times per day  . hydrocortisone sod succinate (SOLU-CORTEF) inj  50 mg Intravenous Q8H  . insulin aspart  0-15 Units Subcutaneous 6 times per day  . ipratropium-albuterol  3 mL Nebulization Q4H  . meropenem (MERREM) IV  1 g Intravenous Q12H  . midodrine  10 mg Oral TID WC  . multivitamin  5 mL Per Tube Daily  . nystatin   Topical TID  . pantoprazole (PROTONIX) IV  40 mg Intravenous Q12H  . potassium phosphate IVPB (mmol)  15 mmol Intravenous Once  . sodium hypochlorite   Irrigation BID    Assessment: 49 yo female admitted for sepsis, found to have hypokalemia. Pharmacy consulted for electrolyte management.  CCa: 9.6  Plan:  Electrolytes are wnl.  Pharmacy will continue to monitor and adjust per consult.    Luisa Hart D, Pharm.D. Clinical Pharmacist 08/24/2015,6:59 AM

## 2015-08-24 NOTE — Progress Notes (Signed)
eLink Physician-Brief Progress Note Patient Name: Courtney [name] DOB: 01/04/1966 MRN: [id number]   Date of Service  08/24/2015  HPI/Events of Note  3 beat run of NSVT @ 11am for which amio was ordered Not started until now - no further events nml EF noted -low risk   eICU Interventions  Dc amio Hypokalemia -repleted      Intervention Category Intermediate Interventions: Arrhythmia - evaluation and management  Courtney [name] V. 08/24/2015, 1:30 AM

## 2015-08-24 NOTE — Progress Notes (Signed)
Subjective:  Husband and daughter at bedside. Off norepinephrine  CRRT 2 K bath with UF of 50ml/hr   Objective:  Vital signs in last 24 hours:  Temp:  [97 F (36.1 C)-98.1 F (36.7 C)] 97.5 F (36.4 C) (10/05 0800) Pulse Rate:  [92-121] 105 (10/05 0900) Resp:  [0-29] 8 (10/05 0900) BP: (89-158)/(29-138) 107/77 mmHg (10/05 0900) SpO2:  [95 %-100 %] 100 % (10/05 0900) FiO2 (%):  [40 %] 40 % (10/05 0900)  Weight change:  Filed Weights   08/19/15 1615 08/20/15 1600 08/22/15 0500  Weight: 85.1 kg (187 lb 9.8 oz) 80.3 kg (177 lb 0.5 oz) 81.2 kg (179 lb 0.2 oz)    Intake/Output: I/O last 3 completed shifts: In: 2929.9 [I.V.:523.9; Blood:1246; NG/GT:210; IV Piggyback:950] Out: 1383 [Other:483; Stool:900]   Intake/Output this shift:  Total I/O In: 3.6 [I.V.:3.6] Out: 15 [Other:15]  Physical Exam: General: Critically ill  Head: Normocephalic, atraumatic. +NGT   Eyes: Eyes open, sluggish to light.   Neck: Trach in place, 100% O2, left IJ catheter  Lungs:  Scattered rhonchi, normal effort  Heart: tachycardia  Abdomen:  Soft, nontender, BS present  Extremities:  +right upper extremity edema, trace edema of left upper extremity and dependent  Neurologic: Lethargic, opens eyes to verbal commands  Skin: No lesions  Access: R IJ permcath    Basic Metabolic Panel:  Recent Labs Lab 08/23/15 1527 08/23/15 1726 08/23/15 2145 08/24/15 0141 08/24/15 0540  NA 133* 138 137 138 139  138  K 3.3* 3.4*  3.4* 3.5 3.6 4.3  4.2  CL 101 105 106 108 107  107  CO2 27 28 27 28 27  27   GLUCOSE 106* 97 98 88 74  76  BUN 26* 26* 23* 23* 21*  21*  CREATININE 0.43* 0.57 0.48 0.53 0.43*  0.48  CALCIUM 7.9* 8.0* 8.0* 7.8* 8.1*  7.8*  MG 15.0* 2.4 2.2 2.1 2.0  PHOS 2.1* 2.3*  2.3* 1.9* 1.9* 1.7*  1.8*    Liver Function Tests:  Recent Labs Lab 08/18/15 0507  08/22/15 0539  08/23/15 1527 08/23/15 1726 08/23/15 2145 08/24/15 0141 08/24/15 0540  AST 23  --  22  --   --    --   --   --   --   ALT 22  --  19  --   --   --   --   --   --   ALKPHOS 384*  --  454*  --   --   --   --   --   --   BILITOT 1.0  --  0.6  --   --   --   --   --   --   PROT 4.4*  --  4.6*  --   --   --   --   --   --   ALBUMIN 1.7*  < > 2.0*  < > 2.1* 2.0* 2.1* 2.1* 2.1*  < > = values in this interval not displayed. No results for input(s): LIPASE, AMYLASE in the last 168 hours. No results for input(s): AMMONIA in the last 168 hours.  CBC:  Recent Labs Lab 08/21/15 0417  08/23/15 0337 08/23/15 0804 08/23/15 1527 08/23/15 2145 08/24/15 0543  WBC 11.0  < > 16.8* 14.3* 12.9* 11.2* 9.4  NEUTROABS 9.2*  --   --   --   --   --   --   HGB 8.4*  < > 7.7* 7.4* 7.0* 8.8* 8.3*  HCT 25.9*  < > 23.6* 22.3* 21.4* 27.0* 24.6*  MCV 94.2  < > 88.4 88.4 89.0 85.3 85.0  PLT 60*  < > 110* 101* PLATELET CLUMPS NOTED ON SMEAR, UNABLE TO ESTIMATE 84* 87*  < > = values in this interval not displayed.  Cardiac Enzymes: No results for input(s): CKTOTAL, CKMB, CKMBINDEX, TROPONINI in the last 168 hours.  BNP: Invalid input(s): POCBNP  CBG:  Recent Labs Lab 08/24/15 0001 08/24/15 0338 08/24/15 0546 08/24/15 0833 08/24/15 0909  GLUCAP 85 73 72 68 71    Microbiology: Results for orders placed or performed during the hospital encounter of 08/05/2015  Blood Culture (routine x 2)     Status: None   Collection Time: 07/29/2015  8:51 AM  Result Value Ref Range Status   Specimen Description BLOOD [name]  Final   Special Requests BOTTLES DRAWN AEROBIC AND ANAEROBIC  3CC  Final   Culture NO GROWTH 5 DAYS  Final   Report Status 08/17/2015 FINAL  Final  Blood Culture (routine x 2)     Status: None   Collection Time: 07/24/2015  9:20 AM  Result Value Ref Range Status   Specimen Description BLOOD LEFT ARM  Final   Special Requests BOTTLES DRAWN AEROBIC AND ANAEROBIC  1CC  Final   Culture NO GROWTH 5 DAYS  Final   Report Status 08/17/2015 FINAL  Final  Wound culture     Status: None   Collection  Time: 08/11/2015  9:20 AM  Result Value Ref Range Status   Specimen Description DECUBITIS  Final   Special Requests Normal  Final   Gram Stain   Final    FEW WBC SEEN MANY GRAM NEGATIVE RODS RARE GRAM POSITIVE COCCI    Culture   Final    HEAVY GROWTH ESCHERICHIA COLI MODERATE GROWTH ENTEROBACTER AEROGENES PROTEUS MIRABILIS HEAVY GROWTH ENTEROCOCCUS SPECIES VRE HAVE INTRINSIC RESISTANCE TO MOST COMMONLY USED ANTIBIOTICS AND THE ABILITY TO ACQUIRE RESISTANCE TO MOST AVAILABLE ANTIBIOTICS.    Report Status 08/16/2015 FINAL  Final   Organism ID, Bacteria ESCHERICHIA COLI  Final   Organism ID, Bacteria ENTEROBACTER AEROGENES  Final   Organism ID, Bacteria PROTEUS MIRABILIS  Final   Organism ID, Bacteria ENTEROCOCCUS SPECIES  Final      Susceptibility   Enterobacter aerogenes - MIC*    CEFTAZIDIME <=1 SENSITIVE Sensitive     CEFAZOLIN >=64 RESISTANT Resistant     CEFTRIAXONE <=1 SENSITIVE Sensitive     CIPROFLOXACIN <=0.25 SENSITIVE Sensitive     GENTAMICIN <=1 SENSITIVE Sensitive     IMIPENEM 1 SENSITIVE Sensitive     TRIMETH/SULFA <=20 SENSITIVE Sensitive     * MODERATE GROWTH ENTEROBACTER AEROGENES   Escherichia coli - MIC*    AMPICILLIN <=2 SENSITIVE Sensitive     CEFTAZIDIME <=1 SENSITIVE Sensitive     CEFAZOLIN <=4 SENSITIVE Sensitive     CEFTRIAXONE <=1 SENSITIVE Sensitive     CIPROFLOXACIN <=0.25 SENSITIVE Sensitive     GENTAMICIN <=1 SENSITIVE Sensitive     IMIPENEM <=0.25 SENSITIVE Sensitive     TRIMETH/SULFA <=20 SENSITIVE Sensitive     * HEAVY GROWTH ESCHERICHIA COLI   Proteus mirabilis - MIC*    AMPICILLIN >=32 RESISTANT Resistant     CEFTAZIDIME <=1 SENSITIVE Sensitive     CEFAZOLIN 8 SENSITIVE Sensitive     CEFTRIAXONE <=1 SENSITIVE Sensitive     CIPROFLOXACIN <=0.25 SENSITIVE Sensitive     GENTAMICIN <=1 SENSITIVE Sensitive     IMIPENEM 1 SENSITIVE Sensitive  TRIMETH/SULFA <=20 SENSITIVE Sensitive     * PROTEUS MIRABILIS   Enterococcus species - MIC*     AMPICILLIN >=32 RESISTANT Resistant     VANCOMYCIN >=32 RESISTANT Resistant     GENTAMICIN SYNERGY SENSITIVE Sensitive     TETRACYCLINE Value in next row Resistant      RESISTANT>=16    * HEAVY GROWTH ENTEROCOCCUS SPECIES  MRSA PCR Screening     Status: None   Collection Time: 08/01/2015  2:38 PM  Result Value Ref Range Status   MRSA by PCR NEGATIVE NEGATIVE Final    Comment:        The GeneXpert MRSA Assay (FDA approved for NASAL specimens only), is one component of a comprehensive MRSA colonization surveillance program. It is not intended to diagnose MRSA infection nor to guide or monitor treatment for MRSA infections.   Blood culture (single)     Status: None   Collection Time: 08/03/2015  3:36 PM  Result Value Ref Range Status   Specimen Description BLOOD RIGHT ASSIST CONTROL  Final   Special Requests BOTTLES DRAWN AEROBIC AND ANAEROBIC  Final   Culture NO GROWTH 5 DAYS  Final   Report Status 08/17/2015 FINAL  Final  Wound culture     Status: None   Collection Time: 08/13/15 12:37 PM  Result Value Ref Range Status   Specimen Description WOUND  Final   Special Requests Normal  Final   Gram Stain   Final    FEW WBC SEEN TOO NUMEROUS TO COUNT GRAM NEGATIVE RODS FEW GRAM POSITIVE COCCI    Culture   Final    HEAVY GROWTH ESCHERICHIA COLI MODERATE GROWTH PROTEUS MIRABILIS LIGHT GROWTH KLEBSIELLA PNEUMONIAE MODERATE GROWTH ENTEROCOCCUS GALLINARUM CRITICAL RESULT CALLED TO, READ BACK BY AND VERIFIED WITH: [facility] BORBA AT 1042 08/16/15 DV    Report Status 08/17/2015 FINAL  Final   Organism ID, Bacteria ESCHERICHIA COLI  Final   Organism ID, Bacteria PROTEUS MIRABILIS  Final   Organism ID, Bacteria KLEBSIELLA PNEUMONIAE  Final   Organism ID, Bacteria ENTEROCOCCUS GALLINARUM  Final      Susceptibility   Escherichia coli - MIC*    AMPICILLIN >=32 RESISTANT Resistant     CEFTAZIDIME 4 RESISTANT Resistant     CEFAZOLIN >=64 RESISTANT Resistant     CEFTRIAXONE 16 RESISTANT  Resistant     GENTAMICIN 2 SENSITIVE Sensitive     IMIPENEM >=16 RESISTANT Resistant     TRIMETH/SULFA <=20 SENSITIVE Sensitive     Extended ESBL POSITIVE Resistant     PIP/TAZO Value in next row Resistant      RESISTANT>=128    CIPROFLOXACIN Value in next row Sensitive      SENSITIVE<=0.25    * HEAVY GROWTH ESCHERICHIA COLI   Klebsiella pneumoniae - MIC*    AMPICILLIN Value in next row Resistant      SENSITIVE<=0.25    CEFTAZIDIME Value in next row Resistant      SENSITIVE<=0.25    CEFAZOLIN Value in next row Resistant      SENSITIVE<=0.25    CEFTRIAXONE Value in next row Resistant      SENSITIVE<=0.25    CIPROFLOXACIN Value in next row Resistant      SENSITIVE<=0.25    GENTAMICIN Value in next row Sensitive      SENSITIVE<=0.25    IMIPENEM Value in next row Resistant      SENSITIVE<=0.25    TRIMETH/SULFA Value in next row Resistant      SENSITIVE<=0.25  PIP/TAZO Value in next row Resistant      RESISTANT>=128    * LIGHT GROWTH KLEBSIELLA PNEUMONIAE   Proteus mirabilis - MIC*    AMPICILLIN Value in next row Resistant      RESISTANT>=128    CEFTAZIDIME Value in next row Sensitive      RESISTANT>=128    CEFAZOLIN Value in next row Sensitive      RESISTANT>=128    CEFTRIAXONE Value in next row Sensitive      RESISTANT>=128    CIPROFLOXACIN Value in next row Sensitive      RESISTANT>=128    GENTAMICIN Value in next row Sensitive      RESISTANT>=128    IMIPENEM Value in next row Sensitive      RESISTANT>=128    TRIMETH/SULFA Value in next row Sensitive      RESISTANT>=128    PIP/TAZO Value in next row Sensitive      SENSITIVE<=4    * MODERATE GROWTH PROTEUS MIRABILIS   Enterococcus gallinarum - MIC*    AMPICILLIN Value in next row Resistant      SENSITIVE<=4    GENTAMICIN SYNERGY Value in next row Sensitive      SENSITIVE<=4    CIPROFLOXACIN Value in next row Resistant      RESISTANT>=8    TETRACYCLINE Value in next row Resistant      RESISTANT>=16    *  MODERATE GROWTH ENTEROCOCCUS GALLINARUM  Wound culture     Status: None   Collection Time: 08/15/15  2:53 PM  Result Value Ref Range Status   Specimen Description WOUND  Final   Special Requests NONE  Final   Gram Stain FEW WBC SEEN NO ORGANISMS SEEN   Final   Culture NO GROWTH 3 DAYS  Final   Report Status 08/18/2015 FINAL  Final  C difficile quick scan w PCR reflex     Status: None   Collection Time: 08/17/15 11:34 AM  Result Value Ref Range Status   C Diff antigen NEGATIVE NEGATIVE Final   C Diff toxin NEGATIVE NEGATIVE Final   C Diff interpretation Negative for C. difficile  Final    Coagulation Studies:  Recent Labs  08/21/15 2056  LABPROT 16.7*  INR 1.33    Urinalysis: No results for input(s): COLORURINE, LABSPEC, PHURINE, GLUCOSEU, HGBUR, BILIRUBINUR, KETONESUR, PROTEINUR, UROBILINOGEN, NITRITE, LEUKOCYTESUR in the last 72 hours.  Invalid input(s): APPERANCEUR    Imaging: Dg Chest 1 View  08/24/2015   CLINICAL DATA:  Shortness of breath.  EXAM: CHEST 1 VIEW  COMPARISON:  08/18/2015.  FINDINGS: Tracheostomy tube, feeding tube, left IJ line, right IJ dialysis catheter in stable position. Cardiomegaly with normal pulmonary vascularity. Mild right infrahilar infiltrate. Tiny left pleural effusion cannot be excluded. No pneumothorax.  IMPRESSION: 1. Lines and tubes in stable position. 2. Mild right infrahilar infiltrate. 3. Tiny left pleural effusion cannot be excluded . 4. Stable cardiomegaly.  No pulmonary venous congestion.   Electronically Signed   By: Maisie Fus  Register   On: 08/24/2015 07:24   US Venous Img Upper Uni Right  08/22/2015   CLINICAL DATA:  49 year old female in the CCU with right arm swelling.  EXAM: RIGHT UPPER EXTREMITY VENOUS DOPPLER ULTRASOUND  TECHNIQUE: Gray-scale sonography with graded compression, as well as color Doppler and duplex ultrasound were performed to evaluate the upper extremity deep venous system from the level of the subclavian vein and  including the jugular, axillary, basilic, radial, ulnar and upper cephalic vein. Spectral Doppler was utilized to  evaluate flow at rest and with distal augmentation maneuvers.  COMPARISON:  None.  FINDINGS: Contralateral Subclavian Vein: Respiratory phasicity is normal and symmetric with the symptomatic side. No evidence of thrombus. Normal compressibility.  Internal Jugular Vein: No evidence of thrombus. Normal compressibility. Loss of expected respiratory phasicity.  Subclavian Vein: No evidence of thrombus. Patency maintained. Loss of normal respiratory phasicity  Axillary Vein: No evidence of thrombus. Flow maintained. Loss of expected respiratory phasicity.  Limited visualization of cephalic vein, basilic vein, radial vein and ulnar vein.  Brachial vein: Flow maintained with no thrombus identified. Loss of phasicity.  IMPRESSION: Sonographic survey of the right upper extremity negative for DVT, however, loss of respiratory phasicity on the right is suggestive of a more central stenosis/occlusion in this patient with indwelling central catheters. Further evaluation for a more central stenosis/occlusion may be warranted, with potential imaging studies including a formal venogram or CT venogram.  These results were called by telephone at the time of interpretation on 08/22/2015 at 11:09 am to Dr. Belia Heman, who verbally acknowledged these results.  Signed,  Yvone Neu. Loreta Ave, DO  [facility]  [facility]   Electronically Signed   By: Gilmer Mor D.O.   On: 08/22/2015 11:12     Medications:   . norepinephrine (LEVOPHED) Adult infusion Stopped (08/24/15 0755)  . pureflow 1,500 mL/hr at 08/24/15 0000   . acidophilus  1 capsule Oral Daily  . antiseptic oral rinse  7 mL Mouth Rinse q12n4p  . aspirin  324 mg Oral Daily  . budesonide (PULMICORT) nebulizer solution  0.5 mg Nebulization BID  . chlorhexidine  15 mL Mouth Rinse BID  . DAPTOmycin (CUBICIN)  IV  500 mg  Intravenous Q48H  . epoetin (EPOGEN/PROCRIT) injection  20,000 Units Subcutaneous Weekly  . free water  100 mL Per Tube 3 times per day  . hydrocortisone sod succinate (SOLU-CORTEF) inj  50 mg Intravenous Q8H  . insulin aspart  0-15 Units Subcutaneous 6 times per day  . ipratropium-albuterol  3 mL Nebulization Q4H  . meropenem (MERREM) IV  1 g Intravenous Q12H  . midodrine  10 mg Oral TID WC  . multivitamin  5 mL Per Tube Daily  . nystatin   Topical TID  . pantoprazole (PROTONIX) IV  40 mg Intravenous Q12H  . potassium phosphate IVPB (mmol)  15 mmol Intravenous Once  . sodium hypochlorite   Irrigation BID   sodium chloride, sodium chloride, alteplase, anticoagulant sodium citrate, lidocaine (PF), lidocaine-prilocaine, pentafluoroprop-tetrafluoroeth  Assessment/ Plan:  49 y.o. 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  1. End-stage renal disease on hemodialysis on HD TTHS. The patient has been on dialysis since October of 2014.  R IJ permcath.  - Increase ultrafiltration to 77mL/hr.  - Continue CRRT due to hemodynamic instability  2. Anemia of CKD: Hemoglobin 8.3 Platelets 87 - continue EPO 20000 weekly.   3. Secondary hyperparathyroidism: PTH low at 55. -  phos 1.7: IV phos ordered.   - low due to poor PO intake, loss of muscle mass and CRRT clearance.   4. Septic shock:  Multiple potential sources, has two catheters in place, also has a sacral decubitis, and  also has trach in place. - multiple organisms growing via wound culture - appreciate ID recommendations: currently on meropenem and daptomycin.   Consider Palliative Care. Overall prognosis is poor. Discussed case with husband.    LOS: 12 Courtney [name] 10/5/20169:51 AM

## 2015-08-24 NOTE — Progress Notes (Signed)
[facility] [place] [facility] Medicine Progess Note    ASSESSMENT/PLAN  Patient is status post Gastric bypass with sleeve gastrectomy March 2014 at [facility] complicated by SBO requiring lysis of adhesions with subsequent small bowel enterotomy resulting in shock and multiple cardiac arrests, chronic respiratory failure s/p tracheostomy, DVT/PE on coumadin, HTN, DM2, OSA, COPD, ESRD. Intra-abdominal abscesses s/p lap LOA and drainage 03/2013. Subsequently has experienced numerous infections and hospitalizations since that time. Most recently on this admission she is admitted for severe septic shock and acute kidney injury as well as chronically contaminated decubitus ulcer. Her course on this admission has been, complicated by ischemic stroke, likely cardioembolic, and GI bleeding suspected to be from a rectal ulcer complicated by presence of a fecal tube.    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 History of obstructive sleep apnea History of COPD P:  Supplemental O2 to maintain SpO2 90-96% If requires vent support, will need to change trach to cuffed Currently doing well trach collar and was not required ventilator support. -Patient is noted to have copious secretions with concomitant drop in her oxygen saturation. This response to suctioning.   CARDIOVASCULAR A:  -Initial septic shock on pressors, subsequently developed hemorrhagic shock. Currently weaned off of all pressors. -Chronic steroids - possible component of adrenal insufficiency,currently on steroids. -Nonsustained ventricular tachycardia; history of coronary artery disease, diastolic congestive heart failure, as well as monomorphic and polymorphic ventricular tachycardia on previous admissions, which had responded to amiodarone. -History of cardiac arrest 3. -Echocardiogram from 08/22/2015 results were reviewed; ejection fraction was  55-60%, pulmonary systolic pressure was 39 mmHg P:  MAP goal > 60 mmHg Taper stress dose steroids as permitted by BP -The patient's episodes of intermittent tachycardia have improved, likely been related to hypokalemia as well as hypotension at the time. The amiodarone infusion has been stopped, we'll continue to monitor.  RENAL A:  ESRD - now on CRRT Hypokalemia, resolved P:  Monitor BMET intermittently Monitor I/Os Correct electrolytes as indicated Nephrollogy following.   GASTROINTESTINAL A:  -Gastrointestinal bleeding with maroon colored stools in the fecal bag. Discussed with gastroneurology this was thought to be due to a rectal ulcer. Bleeding was associated with hypotension requiring pressure support and transfusion of blood. This appears to be improved. Given the patient's recurrent sepsis episodes from the decubitus ulcer I will keep the rectal tube in place for the time being and monitor the patient closely. This was discussed in detail with the patient's husband  at the bedside, who is in agreement with this plan, and is aware of the risks and benefits associated with maintaining the rectal tube in place. -Hx of C. Diff - PCR neg -Incontinent of stool with chronic contamination of sacral decubitus ulcer.  P:  -Maintain rectal tube in place. Monitor closely. SUP: N/I unless requires vent support -tube feeds off today. We'll restart tomorrow if no further bleeding is appreciated. -No anticoagulation. -c.diff PCR>>neg -As we have not ruled out gastritis or peptic ulcer disease as a cause of the patient's Intestinal bleeding, we will continue IV Protonix with twice a day dosing. Can consider change to once daily dosing if no further episodes of bleeding.  HEMATOLOGIC A:  Acute on chronic anemia Thrombocytopenia, . Etiology unclear. Platelet [name] have been improving P:  DVT px: SCDs Monitor CBC intermittently Transfuse per usual guidelines Check HIT panel  9/25 - negative -will give platelet transfusions as needed for platelet count less  than 10; or was 20 with evidence of active bleeding.  INFECTIOUS A:  -Severe sepsis, resolved. Was secondary to infected sacral decubitus ulcer. -Sacral decubitus ulcer with abscess -decubitus ulcer with history of osteomyelitis. 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 P:  Monitor temp, WBC count Micro and abx as above   ENDOCRINE A:  DM 2, controlled Chronic prednisone therapy - was on chronic dose 10 mg BID Secondary adrenal insuff P:  CBGs and SSI - mod scale -Stress dose steroids - wean as able based on BP; will decrease dose of hydrocortisone IV to 25 mg every 8 hours.  NEUROLOGIC A:  Acute encephalopathy, -somnolent still, suspect due to chronic sepsis with metabolic encephalopathy.  MRI suggest acute infarcts follow up Neuro recs  RASS goal: 0 Minimize sedating meds - baseline mentation is talking and following commands.     ADMISSION DATE: 09-09-2015 CONSULTATION DATE: 9/23  INITIAL PRESENTATION:  41 F who has been in medical facilities (hosp, LTAC, rehab) for 2 yrs following gastric bypass surgery with multiple complications. Now with chronic trach, ESRD, profound debilitation, severe sacral pressure ulcer. Was seemingly making progress and transferred to rehab facility approx one week prior to this admission. She was sent to [facility] ED with AMS and hypotension. Working dx of severe sepsis/septic shock due to infected sacral pressure ulcer. Also has R side externalized HD cath and tunneled L IJ CVL. She has history of resistant bacterial infections. She is on chronic steroids. Her husband indicates that she has been diagnosed with adrenal insuff at some point during the past 2 yrs  MAJOR EVENTS/TEST RESULTS:  Admission  02/07/14-05/07/14 Admission 07/21/14-09/06/14 Discharged to [facility]. Pt had palliative consult at that time, were asked to sign off by husband.  9/23 CT head: NAD 9/23 EEG: no epileptiform activity 9/23 PRBCs for Hgb 6.4 9/24 bedside debridement of sacral wound. Abscess drained 9/25 Off vasopressors. More alert. No distress. Worsening thrombocytopenia. Vanc DC'd 9/29  Dr. Sampson Goon (I.D) excused from the case by patient's husband. 10/2 MRI -multiple infarcts 10/3 tracheal bleeding- transfused platelets 10/3 hospitalist service excused from the case by patient's husband 08/22/2015 Echocardiogram ejection fraction was 55-60%, pulmonary systolic pressure was 39 mmHg  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 History of sepsis from C. glabrata MRSA PCR 9/23 >> NEG Urine 9/23 >>  Wound (swab) 9/23 >> many GNR, rare GPC >>  Blood 9/23 >> neg  Wound (debridement) 9/24 >> heavy GNR >> CRE, Enterococcus, K. Pneumoniae, P. Mirabilis, VRE CDiff 9/27>>neg  ANTIMICROBIALS:  Aztreonam 9/23 >> 9/24 Vanc 9/23 >> 9/25 Vanc 9/26>>9/27 Meropenem 9/23 >>  Daptomycin 9/27>>   ---------------------------------------   ----------------------------------------   Name: Courtney [name] MRN: [id number] DOB: 04/21/1966    ADMISSION DATE:  2015-09-09    CHIEF COMPLAINT:  dyspnea     SUBJECTIVE:   Pt currently nonverbal, can not provide history or review of systems. However, she appears to be awake and opens eyes spontaneously. The patient is nonverbal. Her mental status appears improved from yesterday, she does not appear to move her right side spontaneously.  Review of Systems:  Pt currently nonverbal, can not provide history or review of systems.    VITAL  SIGNS: Temp:  [  97 F (36.1 C)-98.1 F (36.7 C)] 97.5 F (36.4 C) (10/05 0800) Pulse Rate:  [92-121] 103 (10/05 1100) Resp:  [0-24] 11 (10/05 1100) BP: (89-158)/(29-138) 109/32 mmHg (10/05 1100) SpO2:  [95 %-100 %] 100 % (10/05 1100) FiO2 (%):  [40 %] 40 % (10/05 1100) HEMODYNAMICS:   VENTILATOR SETTINGS: Vent Mode:  [-]  FiO2 (%):  [40 %] 40 % INTAKE / OUTPUT:  Intake/Output Summary (Last 24 hours) at 08/24/15 1155 Last data filed at 08/24/15 1100  Gross per 24 hour  Intake 1597.62 ml  Output   1013 ml  Net 584.62 ml    PHYSICAL EXAMINATION: Physical Examination:   VS: BP 109/32 mmHg  Pulse 103  Temp(Src) 97.5 F (36.4 C) (Axillary)  Resp 11  Ht  (1.753 m)  Wt 81.2 kg (179 lb 0.2 oz)  BMI 26.42 kg/m2  SpO2 100%  General Appearance: No distress  Neuro:without focal findings, mental status normal. HEENT: PERRLA, EOM intact. Pulmonary: normal breath sounds   CardiovascularNormal S1,S2.  No m/r/g.   Abdomen: Benign, Soft, non-tender. Renal:  No costovertebral tenderness  GU:  Not performed at this time. Endocrine: No evident thyromegaly. Skin:   warm, no rashes, no ecchymosis  Extremities: normal, no cyanosis, clubbing.   LABS:   LABORATORY PANEL:   CBC  Recent Labs Lab 08/24/15 0543  WBC 9.4  HGB 8.3*  HCT 24.6*  PLT 87*    Chemistries   Recent Labs Lab 08/22/15 0539  08/24/15 0540  NA 139  < > 139  138  K 4.2  < > 4.3  4.2  CL 105  < > 107  107  CO2 30  < > 27  27  GLUCOSE 126*  < > 74  76  BUN 26*  < > 21*  21*  CREATININE 0.60  < > 0.43*  0.48  CALCIUM 8.0*  < > 8.1*  7.8*  MG 1.8  < > 2.0  PHOS 2.0*  < > 1.7*  1.8*  AST 22  --   --   ALT 19  --   --   ALKPHOS 454*  --   --   BILITOT 0.6  --   --   < > = values in this interval not displayed.   Recent Labs Lab 08/24/15 0338 08/24/15 0546 08/24/15 0833 08/24/15 0909 08/24/15 1125 08/24/15 1138  GLUCAP 73 72 68 71 65 75   No results for input(s): PHART,  PCO2ART, PO2ART in the last 168 hours.  Recent Labs Lab 08/18/15 0507  08/22/15 0539  08/23/15 2145 08/24/15 0141 08/24/15 0540  AST 23  --  22  --   --   --   --   ALT 22  --  19  --   --   --   --   ALKPHOS 384*  --  454*  --   --   --   --   BILITOT 1.0  --  0.6  --   --   --   --   ALBUMIN 1.7*  < > 2.0*  < > 2.1* 2.1* 2.1*  < > = values in this interval not displayed.  Cardiac Enzymes No results for input(s): TROPONINI in the last 168 hours.  RADIOLOGY:  Dg Chest 1 View  08/24/2015   CLINICAL DATA:  Shortness of breath.  EXAM: CHEST 1 VIEW  COMPARISON:  08/18/2015.  FINDINGS: Tracheostomy tube, feeding tube, left IJ line, right IJ dialysis catheter in stable  position. Cardiomegaly with normal pulmonary vascularity. Mild right infrahilar infiltrate. Tiny left pleural effusion cannot be excluded. No pneumothorax.  IMPRESSION: 1. Lines and tubes in stable position. 2. Mild right infrahilar infiltrate. 3. Tiny left pleural effusion cannot be excluded . 4. Stable cardiomegaly.  No pulmonary venous congestion.   Electronically Signed   By: Maisie Fus  Register   On: 08/24/2015 07:24       --Wells Guiles, MD.   [facility]   Santiago Glad, M.D.  Stephanie Acre, M.D.  Billy Fischer, M.D  Critical Care Attestation.  I have personally obtained a history, examined the patient, evaluated laboratory and imaging results, formulated the assessment and plan and placed orders. The Patient requires high complexity decision making for assessment and supp course ort, frequent evaluation and titration of therapies, application of advanced monitoring technologies and extensive interpretation of multiple databases. The patient has critical illness that could lead imminently to failure of 1 or more organ systems and requires the highest level of physician preparedness to intervene.  Critical Care Time devoted to patient care services described in this note is 60 minutes and is  exclusive of time spent in procedures.

## 2015-08-24 NOTE — Progress Notes (Signed)
Upon verifying the results in [facility], this RN noted the critical lab value called to me at 2223 was from 1648 earlier this afternoon.  The latest potassium result at 2211 was 4.3.  Spoke to Dr. Wynelle Link regarding potassium results again to inform him of most recent results.  Per Dr. Wynelle Link, do not switch dialysate from 4K to 2K at this time and continue to monitor.  Will continue to assess.

## 2015-08-24 NOTE — Progress Notes (Signed)
Nutrition Follow-up     INTERVENTION:   Coordination of Care: discussed nutritional poc with MD Nicholos Johns and Asher Muir RN. Plan to continue to hold EN today per MD Nicholos Johns; reassess tomorrow with hopes of reinitiating EN via dobhoff tomorrow if Hgb remains stable and no signs of active bleeding    NUTRITION DIAGNOSIS:   Inadequate oral intake related to wound healing, acute illness, chronic illness as evidenced by NPO status, estimated needs. Continues  GOAL:   Patient will meet greater than or equal to 90% of their needs  MONITOR:    (Energy Intake, Anthropometrics, Digestive System, Electrolyte/Renal Profile, Glucose Profile)  REASON FOR ASSESSMENT:   Consult Enteral/tube feeding initiation and management  ASSESSMENT:    Pt remains on CRRT, UF increased to 50 ml/hr, off levophed at present, on trach collar. GI bleed due to rectal ulcer from rectal tube per MD notes, noted long term recommendation of diverting colostomy if feasible. Pt receiving IV supplementation for hypophosphatemia   Diet Order:  Diet NPO time specified  Skin:   (stage IV ulcer on foot, unstageable on sacrum)   Electrolyte and Renal Profile:  Recent Labs Lab 08/23/15 2145 08/24/15 0141 08/24/15 0540  BUN 23* 23* 21*  21*  CREATININE 0.48 0.53 0.43*  0.48  NA 137 138 139  138  K 3.5 3.6 4.3  4.2  MG 2.2 2.1 2.0  PHOS 1.9* 1.9* 1.7*  1.8*   Glucose Profile:  Recent Labs  08/24/15 0909 08/24/15 1125 08/24/15 1138  GLUCAP 71 65 75   Nutritional Anemia Profile:  CBC Latest Ref Rng 08/24/2015 08/23/2015 08/23/2015  WBC 3.6 - 11.0 K/uL 9.4 11.2(H) 12.9(H)  Hemoglobin 12.0 - 16.0 g/dL 8.3(L) 8.8(L) 7.0(L)  Hematocrit 35.0 - 47.0 % 24.6(L) 27.0(L) 21.4(L)  Platelets 150 - 440 K/uL 87(L) 84(L) PLATELET CLUMPS NOTED ON SMEAR, UNABLE TO ESTIMATE    Meds: acidophilus, ss novolog, MVI, levophed off at present  Height:   Ht Readings from Last 1 Encounters:  07/24/2015  (1.753 m)     Weight:   Wt Readings from Last 1 Encounters:  08/22/15 179 lb 0.2 oz (81.2 kg)    Filed Weights   08/19/15 1615 08/20/15 1600 08/22/15 0500  Weight: 187 lb 9.8 oz (85.1 kg) 177 lb 0.5 oz (80.3 kg) 179 lb 0.2 oz (81.2 kg)    BMI:  Body mass index is 26.42 kg/(m^2).  Estimated Nutritional Needs:   Kcal:  1610-9604 kcals (BEE 1434, 1.2 AF, 1.2-1.4 IF)   Protein:  113-150 g/kg (1.5-2.0 g/kg) but may be closer to 150-188 g (2.0-2.5 g/kg)   Fluid:  1000 mL plus UOP   HIGH Care Level   Romelle Starcher MS, RD, LDN [phone] Pager

## 2015-08-24 NOTE — Progress Notes (Signed)
Nurse called epic to consult with the MD regarding a medication. Dr. Vassie Loll was available and was talked to in regards to amiodarone. The amiodarone had been ordered because the pt had previously had a set of 3 V. Tach runs during the day shift around 1100. Throughout the rest of the day the pt was stable and ST/ NSR. The question was in regards to starting the amiodarone or d/c, Dr. Vassie Loll decided to D/C and ordered KCl solution to be administered.

## 2015-08-24 NOTE — Progress Notes (Signed)
NEUROLOGY NOTE  S: Off pressors, more alert per nursing today  ROS unobtainable to aphasia  O: 98.1 112/97 84 19 Moderate distress, overweight Normocephalic, oropharynx clear Supple, no JVD CTA B, no wheezing tachycardic, no murmur Diffuse edema, no C/C  Trached, not sedated, opens to voice, questionable following, does track, GCS 8T Pupils 4mm and sluggish, weak R corneal, good L corneal, R droop Withdrawals L UE, no movement in all others  A/P: 1. Multiple embolic acute infarcts- Etiology still appears to be cardioembolic to me.  This will still cause permanent deficits of R hemiparesis and aphasia which will not likely get better 2. Encephalopathy- Improved significantly today - Will start neurostimulation once pt is stable from GI standpoint - Still needs TEE when stable but now pt cant be anticoagulated anyhow - Check blood cultures again - Hold ASA for now due to GI Bleed - Try to keep Hgb > 8 - Keep MAP > 60 for good perfusion - Antibiotics per ID but would cover for possible endocarditis - Will still follow with you

## 2015-08-24 NOTE — Progress Notes (Addendum)
Pharmacy Consult for Daptomycin/Meropenem Indication: Sepsis/Sacral decubitus  Allergies  Allergen Reactions  . Contrast Media [Iodinated Diagnostic Agents] Anaphylaxis  . Ampicillin Rash    Patient Measurements: Height: 5\' 9"  (175.3 cm) Weight: 179 lb 0.2 oz (81.2 kg) IBW/kg (Calculated) : 66.2   Vital Signs: Temp: 97.8 F (36.6 C) (10/05 1200) Temp Source: Axillary (10/05 1200) BP: 111/68 mmHg (10/05 1200) Pulse Rate: 107 (10/05 1200) Intake/Output from previous day: 10/04 0701 - 10/05 0700 In: 1610.9 [I.V.:164.9; Blood:646; IV Piggyback:800] Out: 983 [Stool:500] Intake/Output from this shift: Total I/O In: 603.6 [I.V.:3.6; IV Piggyback:600] Out: 78 [Other:78]  Labs:  Recent Labs  08/23/15 1527  08/23/15 2145 08/24/15 0141 08/24/15 0540 08/24/15 0543  WBC 12.9*  --  11.2*  --   --  9.4  HGB 7.0*  --  8.8*  --   --  8.3*  PLT PLATELET CLUMPS NOTED ON SMEAR, UNABLE TO ESTIMATE  --  84*  --   --  87*  CREATININE 0.43*  < > 0.48 0.53 0.43*  0.48  --   < > = values in this interval not displayed. Estimated Creatinine Clearance: 95.9 mL/min (by C-G formula based on Cr of 0.43). No results for input(s): VANCOTROUGH, VANCOPEAK, VANCORANDOM, GENTTROUGH, GENTPEAK, GENTRANDOM, TOBRATROUGH, TOBRAPEAK, TOBRARND, AMIKACINPEAK, AMIKACINTROU, AMIKACIN in the last 72 hours.   Microbiology: Recent Results (from the past 720 hour(s))  Culture, blood (routine x 2)     Status: None   Collection Time: 08/01/15  7:00 PM  Result Value Ref Range Status   Specimen Description BLOOD RIGHT ANTECUBITAL  Final   Special Requests BOTTLES DRAWN AEROBIC ONLY 5CC  Final   Culture NO GROWTH 5 DAYS  Final   Report Status 08/06/2015 FINAL  Final  Culture, blood (routine x 2)     Status: None   Collection Time: 08/01/15  7:10 PM  Result Value Ref Range Status   Specimen Description BLOOD RIGHT HAND  Final   Special Requests IN PEDIATRIC BOTTLE 3CC  Final   Culture  Setup Time   Final   GRAM POSITIVE COCCI IN CLUSTERS AEROBIC BOTTLE ONLY CRITICAL RESULT CALLED TO, READ BACK BY AND VERIFIED WITH: D TAYLOR,RN AT 1115 08/02/15 BY L BENFIELD    Culture STAPHYLOCOCCUS SPECIES (COAGULASE NEGATIVE)  Final   Report Status 08/06/2015 FINAL  Final   Organism ID, Bacteria STAPHYLOCOCCUS SPECIES (COAGULASE NEGATIVE)  Final      Susceptibility   Staphylococcus species (coagulase negative) - MIC*    CIPROFLOXACIN >=8 RESISTANT Resistant     ERYTHROMYCIN <=0.25 SENSITIVE Sensitive     GENTAMICIN 8 INTERMEDIATE Intermediate     OXACILLIN >=4 RESISTANT Resistant     TETRACYCLINE <=1 SENSITIVE Sensitive     VANCOMYCIN 1 SENSITIVE Sensitive     TRIMETH/SULFA 160 RESISTANT Resistant     CLINDAMYCIN <=0.25 SENSITIVE Sensitive     RIFAMPIN <=0.5 SENSITIVE Sensitive     Inducible Clindamycin NEGATIVE Sensitive     * STAPHYLOCOCCUS SPECIES (COAGULASE NEGATIVE)  Culture, blood (routine x 2)     Status: None   Collection Time: 08/02/15  7:00 PM  Result Value Ref Range Status   Specimen Description BLOOD RIGHT ANTECUBITAL  Final   Special Requests BOTTLES DRAWN AEROBIC ONLY 10CC  Final   Culture  Setup Time   Final    GRAM POSITIVE COCCI IN CLUSTERS AEROBIC BOTTLE ONLY CRITICAL RESULT CALLED TO, READ BACK BY AND VERIFIED WITH: DR Gwenevere Abbot RN 1549 08/03/15 A BROWNING    Culture  Final    STAPHYLOCOCCUS SPECIES (COAGULASE NEGATIVE) SUSCEPTIBILITIES PERFORMED ON PREVIOUS CULTURE WITHIN THE LAST 5 DAYS.    Report Status 08/06/2015 FINAL  Final  Culture, blood (routine x 2)     Status: None   Collection Time: 08/02/15  7:05 PM  Result Value Ref Range Status   Specimen Description BLOOD RIGHT HAND  Final   Special Requests BOTTLES DRAWN AEROBIC AND ANAEROBIC 10CC  Final   Culture NO GROWTH 5 DAYS  Final   Report Status 08/07/2015 FINAL  Final  Blood Culture (routine x 2)     Status: None   Collection Time: 07/31/2015  8:51 AM  Result Value Ref Range Status   Specimen Description BLOOD UNKO   Final   Special Requests BOTTLES DRAWN AEROBIC AND ANAEROBIC  3CC  Final   Culture NO GROWTH 5 DAYS  Final   Report Status 08/17/2015 FINAL  Final  Blood Culture (routine x 2)     Status: None   Collection Time: 08/10/2015  9:20 AM  Result Value Ref Range Status   Specimen Description BLOOD LEFT ARM  Final   Special Requests BOTTLES DRAWN AEROBIC AND ANAEROBIC  1CC  Final   Culture NO GROWTH 5 DAYS  Final   Report Status 08/17/2015 FINAL  Final  Wound culture     Status: None   Collection Time: 07/25/2015  9:20 AM  Result Value Ref Range Status   Specimen Description DECUBITIS  Final   Special Requests Normal  Final   Gram Stain   Final    FEW WBC SEEN MANY GRAM NEGATIVE RODS RARE GRAM POSITIVE COCCI    Culture   Final    HEAVY GROWTH ESCHERICHIA COLI MODERATE GROWTH ENTEROBACTER AEROGENES PROTEUS MIRABILIS HEAVY GROWTH ENTEROCOCCUS SPECIES VRE HAVE INTRINSIC RESISTANCE TO MOST COMMONLY USED ANTIBIOTICS AND THE ABILITY TO ACQUIRE RESISTANCE TO MOST AVAILABLE ANTIBIOTICS.    Report Status 08/16/2015 FINAL  Final   Organism ID, Bacteria ESCHERICHIA COLI  Final   Organism ID, Bacteria ENTEROBACTER AEROGENES  Final   Organism ID, Bacteria PROTEUS MIRABILIS  Final   Organism ID, Bacteria ENTEROCOCCUS SPECIES  Final      Susceptibility   Enterobacter aerogenes - MIC*    CEFTAZIDIME <=1 SENSITIVE Sensitive     CEFAZOLIN >=64 RESISTANT Resistant     CEFTRIAXONE <=1 SENSITIVE Sensitive     CIPROFLOXACIN <=0.25 SENSITIVE Sensitive     GENTAMICIN <=1 SENSITIVE Sensitive     IMIPENEM 1 SENSITIVE Sensitive     TRIMETH/SULFA <=20 SENSITIVE Sensitive     * MODERATE GROWTH ENTEROBACTER AEROGENES   Escherichia coli - MIC*    AMPICILLIN <=2 SENSITIVE Sensitive     CEFTAZIDIME <=1 SENSITIVE Sensitive     CEFAZOLIN <=4 SENSITIVE Sensitive     CEFTRIAXONE <=1 SENSITIVE Sensitive     CIPROFLOXACIN <=0.25 SENSITIVE Sensitive     GENTAMICIN <=1 SENSITIVE Sensitive     IMIPENEM <=0.25 SENSITIVE  Sensitive     TRIMETH/SULFA <=20 SENSITIVE Sensitive     * HEAVY GROWTH ESCHERICHIA COLI   Proteus mirabilis - MIC*    AMPICILLIN >=32 RESISTANT Resistant     CEFTAZIDIME <=1 SENSITIVE Sensitive     CEFAZOLIN 8 SENSITIVE Sensitive     CEFTRIAXONE <=1 SENSITIVE Sensitive     CIPROFLOXACIN <=0.25 SENSITIVE Sensitive     GENTAMICIN <=1 SENSITIVE Sensitive     IMIPENEM 1 SENSITIVE Sensitive     TRIMETH/SULFA <=20 SENSITIVE Sensitive     * PROTEUS MIRABILIS  Enterococcus species - MIC*    AMPICILLIN >=32 RESISTANT Resistant     VANCOMYCIN >=32 RESISTANT Resistant     GENTAMICIN SYNERGY SENSITIVE Sensitive     TETRACYCLINE Value in next row Resistant      RESISTANT>=16    * HEAVY GROWTH ENTEROCOCCUS SPECIES  MRSA PCR Screening     Status: None   Collection Time: 2015/08/16  2:38 PM  Result Value Ref Range Status   MRSA by PCR NEGATIVE NEGATIVE Final    Comment:        The GeneXpert MRSA Assay (FDA approved for NASAL specimens only), is one component of a comprehensive MRSA colonization surveillance program. It is not intended to diagnose MRSA infection nor to guide or monitor treatment for MRSA infections.   Blood culture (single)     Status: None   Collection Time: 2015-08-16  3:36 PM  Result Value Ref Range Status   Specimen Description BLOOD RIGHT ASSIST CONTROL  Final   Special Requests BOTTLES DRAWN AEROBIC AND ANAEROBIC  Final   Culture NO GROWTH 5 DAYS  Final   Report Status 08/17/2015 FINAL  Final  Wound culture     Status: None   Collection Time: 08/13/15 12:37 PM  Result Value Ref Range Status   Specimen Description WOUND  Final   Special Requests Normal  Final   Gram Stain   Final    FEW WBC SEEN TOO NUMEROUS TO COUNT GRAM NEGATIVE RODS FEW GRAM POSITIVE COCCI    Culture   Final    HEAVY GROWTH ESCHERICHIA COLI MODERATE GROWTH PROTEUS MIRABILIS LIGHT GROWTH KLEBSIELLA PNEUMONIAE MODERATE GROWTH ENTEROCOCCUS GALLINARUM CRITICAL RESULT CALLED TO, READ BACK  BY AND VERIFIED WITH: [facility] BORBA AT 1042 08/16/15 DV    Report Status 08/17/2015 FINAL  Final   Organism ID, Bacteria ESCHERICHIA COLI  Final   Organism ID, Bacteria PROTEUS MIRABILIS  Final   Organism ID, Bacteria KLEBSIELLA PNEUMONIAE  Final   Organism ID, Bacteria ENTEROCOCCUS GALLINARUM  Final      Susceptibility   Escherichia coli - MIC*    AMPICILLIN >=32 RESISTANT Resistant     CEFTAZIDIME 4 RESISTANT Resistant     CEFAZOLIN >=64 RESISTANT Resistant     CEFTRIAXONE 16 RESISTANT Resistant     GENTAMICIN 2 SENSITIVE Sensitive     IMIPENEM >=16 RESISTANT Resistant     TRIMETH/SULFA <=20 SENSITIVE Sensitive     Extended ESBL POSITIVE Resistant     PIP/TAZO Value in next row Resistant      RESISTANT>=128    CIPROFLOXACIN Value in next row Sensitive      SENSITIVE<=0.25    * HEAVY GROWTH ESCHERICHIA COLI   Klebsiella pneumoniae - MIC*    AMPICILLIN Value in next row Resistant      SENSITIVE<=0.25    CEFTAZIDIME Value in next row Resistant      SENSITIVE<=0.25    CEFAZOLIN Value in next row Resistant      SENSITIVE<=0.25    CEFTRIAXONE Value in next row Resistant      SENSITIVE<=0.25    CIPROFLOXACIN Value in next row Resistant      SENSITIVE<=0.25    GENTAMICIN Value in next row Sensitive      SENSITIVE<=0.25    IMIPENEM Value in next row Resistant      SENSITIVE<=0.25    TRIMETH/SULFA Value in next row Resistant      SENSITIVE<=0.25    PIP/TAZO Value in next row Resistant      RESISTANT>=128    *  LIGHT GROWTH KLEBSIELLA PNEUMONIAE   Proteus mirabilis - MIC*    AMPICILLIN Value in next row Resistant      RESISTANT>=128    CEFTAZIDIME Value in next row Sensitive      RESISTANT>=128    CEFAZOLIN Value in next row Sensitive      RESISTANT>=128    CEFTRIAXONE Value in next row Sensitive      RESISTANT>=128    CIPROFLOXACIN Value in next row Sensitive      RESISTANT>=128    GENTAMICIN Value in next row Sensitive      RESISTANT>=128    IMIPENEM Value in next row  Sensitive      RESISTANT>=128    TRIMETH/SULFA Value in next row Sensitive      RESISTANT>=128    PIP/TAZO Value in next row Sensitive      SENSITIVE<=4    * MODERATE GROWTH PROTEUS MIRABILIS   Enterococcus gallinarum - MIC*    AMPICILLIN Value in next row Resistant      SENSITIVE<=4    GENTAMICIN SYNERGY Value in next row Sensitive      SENSITIVE<=4    CIPROFLOXACIN Value in next row Resistant      RESISTANT>=8    TETRACYCLINE Value in next row Resistant      RESISTANT>=16    * MODERATE GROWTH ENTEROCOCCUS GALLINARUM  Wound culture     Status: None   Collection Time: 08/15/15  2:53 PM  Result Value Ref Range Status   Specimen Description WOUND  Final   Special Requests NONE  Final   Gram Stain FEW WBC SEEN NO ORGANISMS SEEN   Final   Culture NO GROWTH 3 DAYS  Final   Report Status 08/18/2015 FINAL  Final  C difficile quick scan w PCR reflex     Status: None   Collection Time: 08/17/15 11:34 AM  Result Value Ref Range Status   C Diff antigen NEGATIVE NEGATIVE Final   C Diff toxin NEGATIVE NEGATIVE Final   C Diff interpretation Negative for C. difficile  Final    Medical History: Past Medical History  Diagnosis Date  . Obesity   . Dyslipidemia   . Hypertension   . Coronary artery disease     s/p BMS 2010 LAD  . Dysrhythmia     ventricular tachycardia resolved after LAD stent and beta blocker  . Diabetes mellitus     with retinopathy, neuropathy and microalbuminemia  . ESRD (end stage renal disease) on dialysis     Medications:  Scheduled:  . acidophilus  1 capsule Oral Daily  . antiseptic oral rinse  7 mL Mouth Rinse q12n4p  . aspirin  324 mg Oral Daily  . budesonide (PULMICORT) nebulizer solution  0.5 mg Nebulization BID  . chlorhexidine  15 mL Mouth Rinse BID  . DAPTOmycin (CUBICIN)  IV  500 mg Intravenous Q48H  . epoetin (EPOGEN/PROCRIT) injection  20,000 Units Subcutaneous Weekly  . free water  100 mL Per Tube 3 times per day  . hydrocortisone sod  succinate (SOLU-CORTEF) inj  25 mg Intravenous Q8H  . insulin aspart  0-15 Units Subcutaneous 6 times per day  . ipratropium-albuterol  3 mL Nebulization BID  . meropenem (MERREM) IV  1 g Intravenous Q12H  . midodrine  10 mg Oral TID WC  . multivitamin  5 mL Per Tube Daily  . nystatin   Topical TID  . pantoprazole (PROTONIX) IV  40 mg Intravenous Q12H  . potassium phosphate IVPB (mmol)  15 mmol Intravenous  Once  . potassium phosphate IVPB (mmol)  20 mmol Intravenous Once  . sodium hypochlorite   Irrigation BID   Infusions:  . norepinephrine (LEVOPHED) Adult infusion Stopped (08/24/15 0755)  . 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 Patient is on day 12 of antibiotics (day 8 of daptomycin and day 12 of meropenem).  Goal of Therapy:  Resolution of infection  Plan:   Patient recently converted to CRRT. Will increase daptomycin to 8 mg/kg q 48 hours and continue meropenem 1000 mg IV Q12 hours.   Luisa Hart D 08/24/2015,12:42 PM

## 2015-08-24 NOTE — Progress Notes (Signed)
   08/24/15 1015  Clinical Encounter Type  Visited With Patient and family together  Visit Type Follow-up  Consult/Referral To Chaplain  Spiritual Encounters  Spiritual Needs Emotional  Stress Factors  Patient Stress Factors None identified  Family Stress Factors None identified  Chaplain Thomas and I checked in with the spouse of the patient. Spouse gave an update and Mariea Clonts and I offered a compassionate presence and listening ear. Chaplain Cullan Launer A. Kegan Mckeithan Ext. [phone]

## 2015-08-24 NOTE — Progress Notes (Signed)
GI Inpatient Follow-up Note  Patient Identification: Courtney [name] is a 49 y.o. female with complicated hx with rectal bleeding from likely rectal ulcer from rectal tuber  Subjective:  No bleeding today. Hgb stable.  She cannot give hx.  Rectal tube still in.   Scheduled Inpatient Medications:  . acidophilus  1 capsule Oral Daily  . antiseptic oral rinse  7 mL Mouth Rinse q12n4p  . aspirin  324 mg Oral Daily  . budesonide (PULMICORT) nebulizer solution  0.5 mg Nebulization BID  . chlorhexidine  15 mL Mouth Rinse BID  . DAPTOmycin (CUBICIN)  IV  650 mg Intravenous Q48H  . epoetin (EPOGEN/PROCRIT) injection  20,000 Units Subcutaneous Weekly  . free water  100 mL Per Tube 3 times per day  . hydrocortisone sod succinate (SOLU-CORTEF) inj  25 mg Intravenous Q8H  . insulin aspart  0-15 Units Subcutaneous 6 times per day  . ipratropium-albuterol  3 mL Nebulization BID  . meropenem (MERREM) IV  1 g Intravenous Q12H  . midodrine  10 mg Oral TID WC  . multivitamin  5 mL Per Tube Daily  . nystatin   Topical TID  . pantoprazole (PROTONIX) IV  40 mg Intravenous Q12H  . potassium phosphate IVPB (mmol)  15 mmol Intravenous Once  . sodium hypochlorite   Irrigation BID    Continuous Inpatient Infusions:   . norepinephrine (LEVOPHED) Adult infusion Stopped (08/24/15 0755)  . pureflow 1,500 mL/hr at 08/24/15 2158    PRN Inpatient Medications:  sodium chloride, sodium chloride, alteplase, anticoagulant sodium citrate, lidocaine (PF), lidocaine-prilocaine, pentafluoroprop-tetrafluoroeth  Review of Systems:    Physical Examination: BP 121/83 mmHg  Pulse 109  Temp(Src) 98.6 F (37 C) (Axillary)  Resp 5  Ht  (1.753 m)  Wt 81.2 kg (179 lb 0.2 oz)  BMI 26.42 kg/m2  SpO2 100% Gen: NAD, alert and oriented x 0 Chest: coarse bilat, no w/c  CV: RRR, no m/g/c/r Abd: soft, NT, +distended, +BS in all four quadrants; no HSM, guarding, ridigity, or rebound tenderness Ext: no edema,  well perfused with 2+ pulses,   Data: Lab Results  Component Value Date   WBC 8.2 08/24/2015   HGB 8.0* 08/24/2015   HCT 23.7* 08/24/2015   MCV 86.1 08/24/2015   PLT 76* 08/24/2015    Recent Labs Lab 08/23/15 2145 08/24/15 0543 08/24/15 1648  HGB 8.8* 8.3* 8.0*   Lab Results  Component Value Date   NA 139 08/24/2015   NA 138 08/24/2015   K 4.3 08/24/2015   K 4.2 08/24/2015   CL 107 08/24/2015   CL 107 08/24/2015   CO2 27 08/24/2015   CO2 27 08/24/2015   BUN 21* 08/24/2015   BUN 21* 08/24/2015   CREATININE 0.43* 08/24/2015   CREATININE 0.48 08/24/2015   Lab Results  Component Value Date   ALT 19 08/22/2015   AST 22 08/22/2015   ALKPHOS 454* 08/22/2015   BILITOT 0.6 08/22/2015    Recent Labs Lab 08/21/15 2056  08/24/15 0540  APTT  --   < > 43*  INR 1.33  --   --   < > = values in this interval not displayed.   Assessment/Plan: Courtney [name] is a 49 y.o. female with rectal bleeding in setting of indwelling rectal tube. Hgb still trending down although less bleeding today.   Recommendations: -  d/c rectal tube, not a long term treatment for sacral decub - surgical consult for diverting colostomy once stable medically.  -  I will sign off,  If bleeding continues once rectal tube d/c'd, please re-contact me.   Please call with questions or concerns.  Courtney [name], Addison Naegeli, MD

## 2015-08-24 NOTE — Progress Notes (Signed)
PHARMACY - CRITICAL CARE PROGRESS NOTE  Pharmacy Consult for CRRT Medication Adjustment    Allergies  Allergen Reactions  . Contrast Media [Iodinated Diagnostic Agents] Anaphylaxis  . Ampicillin Rash    Patient Measurements: Height: 5\' 9"  (175.3 cm) Weight: 179 lb 0.2 oz (81.2 kg) IBW/kg (Calculated) : 66.2   Vital Signs: Temp: 97.8 F (36.6 C) (10/05 1200) Temp Source: Axillary (10/05 1200) BP: 111/68 mmHg (10/05 1200) Pulse Rate: 107 (10/05 1200) Intake/Output from previous day: 10/04 0701 - 10/05 0700 In: 1610.9 [I.V.:164.9; Blood:646; IV Piggyback:800] Out: 983 [Stool:500] Intake/Output from this shift: Total I/O In: 603.6 [I.V.:3.6; IV Piggyback:600] Out: 78 [Other:78] Vent settings for last 24 hours: Vent Mode:  [-]  FiO2 (%):  [40 %] 40 %  Labs:  Recent Labs  08/21/15 2056  08/22/15 0539  08/23/15 0515  08/23/15 1527  08/23/15 2145 08/24/15 0141 08/24/15 0540 08/24/15 0543  WBC  --   --  8.5  < >  --   < > 12.9*  --  11.2*  --   --  9.4  HGB  --   --  6.7*  < >  --   < > 7.0*  --  8.8*  --   --  8.3*  HCT  --   --  21.0*  < >  --   < > 21.4*  --  27.0*  --   --  24.6*  PLT  --   --  61*  < >  --   < > PLATELET CLUMPS NOTED ON SMEAR, UNABLE TO ESTIMATE  --  84*  --   --  87*  APTT  --   --  42*  --  54*  --   --   --   --   --  43*  --   INR 1.33  --   --   --   --   --   --   --   --   --   --   --   CREATININE 0.68  < > 0.60  < > 0.51  0.55  < > 0.43*  < > 0.48 0.53 0.43*  0.48  --   MG 2.0  < > 1.8  < > 1.7  < > 15.0*  < > 2.2 2.1 2.0  --   PHOS 2.6  < > 2.0*  < > 1.6*  < > 2.1*  < > 1.9* 1.9* 1.7*  1.8*  --   ALBUMIN 1.9*  < > 2.0*  < > 2.5*  < > 2.1*  < > 2.1* 2.1* 2.1*  --   PROT  --   --  4.6*  --   --   --   --   --   --   --   --   --   AST  --   --  22  --   --   --   --   --   --   --   --   --   ALT  --   --  19  --   --   --   --   --   --   --   --   --   ALKPHOS  --   --  454*  --   --   --   --   --   --   --   --   --   BILITOT   --   --  0.6  --   --   --   --   --   --   --   --   --   < > = values in this interval not displayed. Estimated Creatinine Clearance: 95.9 mL/min (by C-G formula based on Cr of 0.43).   Recent Labs  08/24/15 0909 08/24/15 1125 08/24/15 1138  GLUCAP 71 65 75    Microbiology: Recent Results (from the past 720 hour(s))  Culture, blood (routine x 2)     Status: None   Collection Time: 08/01/15  7:00 PM  Result Value Ref Range Status   Specimen Description BLOOD RIGHT ANTECUBITAL  Final   Special Requests BOTTLES DRAWN AEROBIC ONLY 5CC  Final   Culture NO GROWTH 5 DAYS  Final   Report Status 08/06/2015 FINAL  Final  Culture, blood (routine x 2)     Status: None   Collection Time: 08/01/15  7:10 PM  Result Value Ref Range Status   Specimen Description BLOOD RIGHT HAND  Final   Special Requests IN PEDIATRIC BOTTLE 3CC  Final   Culture  Setup Time   Final    GRAM POSITIVE COCCI IN CLUSTERS AEROBIC BOTTLE ONLY CRITICAL RESULT CALLED TO, READ BACK BY AND VERIFIED WITH: D TAYLOR,RN AT 1115 08/02/15 BY L BENFIELD    Culture STAPHYLOCOCCUS SPECIES (COAGULASE NEGATIVE)  Final   Report Status 08/06/2015 FINAL  Final   Organism ID, Bacteria STAPHYLOCOCCUS SPECIES (COAGULASE NEGATIVE)  Final      Susceptibility   Staphylococcus species (coagulase negative) - MIC*    CIPROFLOXACIN >=8 RESISTANT Resistant     ERYTHROMYCIN <=0.25 SENSITIVE Sensitive     GENTAMICIN 8 INTERMEDIATE Intermediate     OXACILLIN >=4 RESISTANT Resistant     TETRACYCLINE <=1 SENSITIVE Sensitive     VANCOMYCIN 1 SENSITIVE Sensitive     TRIMETH/SULFA 160 RESISTANT Resistant     CLINDAMYCIN <=0.25 SENSITIVE Sensitive     RIFAMPIN <=0.5 SENSITIVE Sensitive     Inducible Clindamycin NEGATIVE Sensitive     * STAPHYLOCOCCUS SPECIES (COAGULASE NEGATIVE)  Culture, blood (routine x 2)     Status: None   Collection Time: 08/02/15  7:00 PM  Result Value Ref Range Status   Specimen Description BLOOD RIGHT ANTECUBITAL   Final   Special Requests BOTTLES DRAWN AEROBIC ONLY 10CC  Final   Culture  Setup Time   Final    GRAM POSITIVE COCCI IN CLUSTERS AEROBIC BOTTLE ONLY CRITICAL RESULT CALLED TO, READ BACK BY AND VERIFIED WITH: DR Gwenevere Abbot RN 1549 08/03/15 A BROWNING    Culture   Final    STAPHYLOCOCCUS SPECIES (COAGULASE NEGATIVE) SUSCEPTIBILITIES PERFORMED ON PREVIOUS CULTURE WITHIN THE LAST 5 DAYS.    Report Status 08/06/2015 FINAL  Final  Culture, blood (routine x 2)     Status: None   Collection Time: 08/02/15  7:05 PM  Result Value Ref Range Status   Specimen Description BLOOD RIGHT HAND  Final   Special Requests BOTTLES DRAWN AEROBIC AND ANAEROBIC 10CC  Final   Culture NO GROWTH 5 DAYS  Final   Report Status 08/07/2015 FINAL  Final  Blood Culture (routine x 2)     Status: None   Collection Time: 07/27/2015  8:51 AM  Result Value Ref Range Status   Specimen Description BLOOD UNKO  Final   Special Requests BOTTLES DRAWN AEROBIC AND ANAEROBIC  3CC  Final   Culture NO GROWTH 5 DAYS  Final   Report Status 08/17/2015 FINAL  Final  Blood Culture (routine x 2)     Status: None   Collection Time: 04-Sep-2015  9:20 AM  Result Value Ref Range Status   Specimen Description BLOOD LEFT ARM  Final   Special Requests BOTTLES DRAWN AEROBIC AND ANAEROBIC  1CC  Final   Culture NO GROWTH 5 DAYS  Final   Report Status 08/17/2015 FINAL  Final  Wound culture     Status: None   Collection Time: 09-04-15  9:20 AM  Result Value Ref Range Status   Specimen Description DECUBITIS  Final   Special Requests Normal  Final   Gram Stain   Final    FEW WBC SEEN MANY GRAM NEGATIVE RODS RARE GRAM POSITIVE COCCI    Culture   Final    HEAVY GROWTH ESCHERICHIA COLI MODERATE GROWTH ENTEROBACTER AEROGENES PROTEUS MIRABILIS HEAVY GROWTH ENTEROCOCCUS SPECIES VRE HAVE INTRINSIC RESISTANCE TO MOST COMMONLY USED ANTIBIOTICS AND THE ABILITY TO ACQUIRE RESISTANCE TO MOST AVAILABLE ANTIBIOTICS.    Report Status 08/16/2015 FINAL  Final    Organism ID, Bacteria ESCHERICHIA COLI  Final   Organism ID, Bacteria ENTEROBACTER AEROGENES  Final   Organism ID, Bacteria PROTEUS MIRABILIS  Final   Organism ID, Bacteria ENTEROCOCCUS SPECIES  Final      Susceptibility   Enterobacter aerogenes - MIC*    CEFTAZIDIME <=1 SENSITIVE Sensitive     CEFAZOLIN >=64 RESISTANT Resistant     CEFTRIAXONE <=1 SENSITIVE Sensitive     CIPROFLOXACIN <=0.25 SENSITIVE Sensitive     GENTAMICIN <=1 SENSITIVE Sensitive     IMIPENEM 1 SENSITIVE Sensitive     TRIMETH/SULFA <=20 SENSITIVE Sensitive     * MODERATE GROWTH ENTEROBACTER AEROGENES   Escherichia coli - MIC*    AMPICILLIN <=2 SENSITIVE Sensitive     CEFTAZIDIME <=1 SENSITIVE Sensitive     CEFAZOLIN <=4 SENSITIVE Sensitive     CEFTRIAXONE <=1 SENSITIVE Sensitive     CIPROFLOXACIN <=0.25 SENSITIVE Sensitive     GENTAMICIN <=1 SENSITIVE Sensitive     IMIPENEM <=0.25 SENSITIVE Sensitive     TRIMETH/SULFA <=20 SENSITIVE Sensitive     * HEAVY GROWTH ESCHERICHIA COLI   Proteus mirabilis - MIC*    AMPICILLIN >=32 RESISTANT Resistant     CEFTAZIDIME <=1 SENSITIVE Sensitive     CEFAZOLIN 8 SENSITIVE Sensitive     CEFTRIAXONE <=1 SENSITIVE Sensitive     CIPROFLOXACIN <=0.25 SENSITIVE Sensitive     GENTAMICIN <=1 SENSITIVE Sensitive     IMIPENEM 1 SENSITIVE Sensitive     TRIMETH/SULFA <=20 SENSITIVE Sensitive     * PROTEUS MIRABILIS   Enterococcus species - MIC*    AMPICILLIN >=32 RESISTANT Resistant     VANCOMYCIN >=32 RESISTANT Resistant     GENTAMICIN SYNERGY SENSITIVE Sensitive     TETRACYCLINE Value in next row Resistant      RESISTANT>=16    * HEAVY GROWTH ENTEROCOCCUS SPECIES  MRSA PCR Screening     Status: None   Collection Time: 2015-09-04  2:38 PM  Result Value Ref Range Status   MRSA by PCR NEGATIVE NEGATIVE Final    Comment:        The GeneXpert MRSA Assay (FDA approved for NASAL specimens only), is one component of a comprehensive MRSA colonization surveillance program. It is  not intended to diagnose MRSA infection nor to guide or monitor treatment for MRSA infections.   Blood culture (single)     Status: None   Collection Time: 04-Sep-2015  3:36 PM  Result Value Ref Range Status  Specimen Description BLOOD RIGHT ASSIST CONTROL  Final   Special Requests BOTTLES DRAWN AEROBIC AND ANAEROBIC  Final   Culture NO GROWTH 5 DAYS  Final   Report Status 08/17/2015 FINAL  Final  Wound culture     Status: None   Collection Time: 08/13/15 12:37 PM  Result Value Ref Range Status   Specimen Description WOUND  Final   Special Requests Normal  Final   Gram Stain   Final    FEW WBC SEEN TOO NUMEROUS TO COUNT GRAM NEGATIVE RODS FEW GRAM POSITIVE COCCI    Culture   Final    HEAVY GROWTH ESCHERICHIA COLI MODERATE GROWTH PROTEUS MIRABILIS LIGHT GROWTH KLEBSIELLA PNEUMONIAE MODERATE GROWTH ENTEROCOCCUS GALLINARUM CRITICAL RESULT CALLED TO, READ BACK BY AND VERIFIED WITH: [facility] BORBA AT 1042 08/16/15 DV    Report Status 08/17/2015 FINAL  Final   Organism ID, Bacteria ESCHERICHIA COLI  Final   Organism ID, Bacteria PROTEUS MIRABILIS  Final   Organism ID, Bacteria KLEBSIELLA PNEUMONIAE  Final   Organism ID, Bacteria ENTEROCOCCUS GALLINARUM  Final      Susceptibility   Escherichia coli - MIC*    AMPICILLIN >=32 RESISTANT Resistant     CEFTAZIDIME 4 RESISTANT Resistant     CEFAZOLIN >=64 RESISTANT Resistant     CEFTRIAXONE 16 RESISTANT Resistant     GENTAMICIN 2 SENSITIVE Sensitive     IMIPENEM >=16 RESISTANT Resistant     TRIMETH/SULFA <=20 SENSITIVE Sensitive     Extended ESBL POSITIVE Resistant     PIP/TAZO Value in next row Resistant      RESISTANT>=128    CIPROFLOXACIN Value in next row Sensitive      SENSITIVE<=0.25    * HEAVY GROWTH ESCHERICHIA COLI   Klebsiella pneumoniae - MIC*    AMPICILLIN Value in next row Resistant      SENSITIVE<=0.25    CEFTAZIDIME Value in next row Resistant      SENSITIVE<=0.25    CEFAZOLIN Value in next row Resistant       SENSITIVE<=0.25    CEFTRIAXONE Value in next row Resistant      SENSITIVE<=0.25    CIPROFLOXACIN Value in next row Resistant      SENSITIVE<=0.25    GENTAMICIN Value in next row Sensitive      SENSITIVE<=0.25    IMIPENEM Value in next row Resistant      SENSITIVE<=0.25    TRIMETH/SULFA Value in next row Resistant      SENSITIVE<=0.25    PIP/TAZO Value in next row Resistant      RESISTANT>=128    * LIGHT GROWTH KLEBSIELLA PNEUMONIAE   Proteus mirabilis - MIC*    AMPICILLIN Value in next row Resistant      RESISTANT>=128    CEFTAZIDIME Value in next row Sensitive      RESISTANT>=128    CEFAZOLIN Value in next row Sensitive      RESISTANT>=128    CEFTRIAXONE Value in next row Sensitive      RESISTANT>=128    CIPROFLOXACIN Value in next row Sensitive      RESISTANT>=128    GENTAMICIN Value in next row Sensitive      RESISTANT>=128    IMIPENEM Value in next row Sensitive      RESISTANT>=128    TRIMETH/SULFA Value in next row Sensitive      RESISTANT>=128    PIP/TAZO Value in next row Sensitive      SENSITIVE<=4    * MODERATE GROWTH PROTEUS MIRABILIS   Enterococcus  gallinarum - MIC*    AMPICILLIN Value in next row Resistant      SENSITIVE<=4    GENTAMICIN SYNERGY Value in next row Sensitive      SENSITIVE<=4    CIPROFLOXACIN Value in next row Resistant      RESISTANT>=8    TETRACYCLINE Value in next row Resistant      RESISTANT>=16    * MODERATE GROWTH ENTEROCOCCUS GALLINARUM  Wound culture     Status: None   Collection Time: 08/15/15  2:53 PM  Result Value Ref Range Status   Specimen Description WOUND  Final   Special Requests NONE  Final   Gram Stain FEW WBC SEEN NO ORGANISMS SEEN   Final   Culture NO GROWTH 3 DAYS  Final   Report Status 08/18/2015 FINAL  Final  C difficile quick scan w PCR reflex     Status: None   Collection Time: 08/17/15 11:34 AM  Result Value Ref Range Status   C Diff antigen NEGATIVE NEGATIVE Final   C Diff toxin NEGATIVE NEGATIVE Final    C Diff interpretation Negative for C. difficile  Final    Medications:  Scheduled:  . acidophilus  1 capsule Oral Daily  . antiseptic oral rinse  7 mL Mouth Rinse q12n4p  . aspirin  324 mg Oral Daily  . budesonide (PULMICORT) nebulizer solution  0.5 mg Nebulization BID  . chlorhexidine  15 mL Mouth Rinse BID  . DAPTOmycin (CUBICIN)  IV  650 mg Intravenous Q48H  . epoetin (EPOGEN/PROCRIT) injection  20,000 Units Subcutaneous Weekly  . free water  100 mL Per Tube 3 times per day  . hydrocortisone sod succinate (SOLU-CORTEF) inj  25 mg Intravenous Q8H  . insulin aspart  0-15 Units Subcutaneous 6 times per day  . ipratropium-albuterol  3 mL Nebulization BID  . meropenem (MERREM) IV  1 g Intravenous Q12H  . midodrine  10 mg Oral TID WC  . multivitamin  5 mL Per Tube Daily  . nystatin   Topical TID  . pantoprazole (PROTONIX) IV  40 mg Intravenous Q12H  . potassium phosphate IVPB (mmol)  15 mmol Intravenous Once  . potassium phosphate IVPB (mmol)  20 mmol Intravenous Once  . sodium hypochlorite   Irrigation BID   Infusions:  . norepinephrine (LEVOPHED) Adult infusion Stopped (08/24/15 0755)  . pureflow 1,500 mL/hr at 08/24/15 1230   PRN: sodium chloride, sodium chloride, alteplase, anticoagulant sodium citrate, lidocaine (PF), lidocaine-prilocaine, pentafluoroprop-tetrafluoroeth  Assessment: Pharmacy consulted to assist in dosing medications for CRRT in this 49 y/o F with ESRD currently unable to tolerate HD.   Plan:  Medications are dosed appropriately for CRRT at present so no need for adjustment. Will continue to follow and adjust as necessary.   Courtney [name] D 08/24/2015,1:04 PM

## 2015-08-24 NOTE — Progress Notes (Signed)
   08/24/15 1900  Clinical Encounter Type  Visited With Family  Visit Type Follow-up  Consult/Referral To Chaplain  Spiritual Encounters  Spiritual Needs Emotional;Other (Comment)  Stress Factors  Family Stress Factors None identified  Chaplain was in the unit and patient's daughter appeared at front desk. Offered a compassionate presence and support as needed. None identified at this time. Chaplain Kaaliyah Kita A. Rayner Erman Ext. [phone]

## 2015-08-25 LAB — RENAL FUNCTION PANEL
ALBUMIN: 1.8 g/dL — AB (ref 3.5–5.0)
ALBUMIN: 2.1 g/dL — AB (ref 3.5–5.0)
ANION GAP: 4 — AB (ref 5–15)
Albumin: 2 g/dL — ABNORMAL LOW (ref 3.5–5.0)
Albumin: 2.1 g/dL — ABNORMAL LOW (ref 3.5–5.0)
Anion gap: 3 — ABNORMAL LOW (ref 5–15)
Anion gap: 3 — ABNORMAL LOW (ref 5–15)
Anion gap: 2 — ABNORMAL LOW (ref 5–15)
BUN: 13 mg/dL (ref 6–20)
BUN: 13 mg/dL (ref 6–20)
BUN: 12 mg/dL (ref 6–20)
BUN: 12 mg/dL (ref 6–20)
CALCIUM: 8.1 mg/dL — AB (ref 8.9–10.3)
CALCIUM: 7.9 mg/dL — AB (ref 8.9–10.3)
CHLORIDE: 109 mmol/L (ref 101–111)
CO2: 29 mmol/L (ref 22–32)
CO2: 28 mmol/L (ref 22–32)
CO2: 29 mmol/L (ref 22–32)
CO2: 28 mmol/L (ref 22–32)
CREATININE: 0.41 mg/dL — AB (ref 0.44–1.00)
Calcium: 7.9 mg/dL — ABNORMAL LOW (ref 8.9–10.3)
Calcium: 7.9 mg/dL — ABNORMAL LOW (ref 8.9–10.3)
Chloride: 109 mmol/L (ref 101–111)
Chloride: 107 mmol/L (ref 101–111)
Chloride: 110 mmol/L (ref 101–111)
Creatinine, Ser: 0.41 mg/dL — ABNORMAL LOW (ref 0.44–1.00)
Creatinine, Ser: 0.5 mg/dL (ref 0.44–1.00)
Creatinine, Ser: 0.43 mg/dL — ABNORMAL LOW (ref 0.44–1.00)
GFR calc Af Amer: >60 mL/min (ref >60)
GFR calc Af Amer: >60 mL/min (ref >60)
GFR calc Af Amer: >60 mL/min (ref >60)
GFR calc non Af Amer: >60 mL/min (ref >60)
GFR calc non Af Amer: >60 mL/min (ref >60)
GFR calc non Af Amer: >60 mL/min (ref >60)
GLUCOSE: 76 mg/dL (ref 65–99)
GLUCOSE: 85 mg/dL (ref 65–99)
GLUCOSE: 75 mg/dL (ref 65–99)
Glucose, Bld: 82 mg/dL (ref 65–99)
PHOSPHORUS: 2.1 mg/dL — AB (ref 2.5–4.6)
PHOSPHORUS: 1.9 mg/dL — AB (ref 2.5–4.6)
POTASSIUM: 4 mmol/L (ref 3.5–5.1)
POTASSIUM: 3.9 mmol/L (ref 3.5–5.1)
POTASSIUM: 3.9 mmol/L (ref 3.5–5.1)
Phosphorus: 1.9 mg/dL — ABNORMAL LOW (ref 2.5–4.6)
Phosphorus: 1.9 mg/dL — ABNORMAL LOW (ref 2.5–4.6)
Potassium: 3.9 mmol/L (ref 3.5–5.1)
SODIUM: 139 mmol/L (ref 135–145)
SODIUM: 140 mmol/L (ref 135–145)
Sodium: 141 mmol/L (ref 135–145)
Sodium: 141 mmol/L (ref 135–145)

## 2015-08-25 LAB — HEMOGLOBIN AND HEMATOCRIT, BLOOD
HEMATOCRIT: 26.7 % — AB (ref 35.0–47.0)
HEMOGLOBIN: 8.9 g/dL — AB (ref 12.0–16.0)

## 2015-08-25 LAB — CBC
HEMATOCRIT: 20.4 % — AB (ref 35.0–47.0)
HEMOGLOBIN: 6.8 g/dL — AB (ref 12.0–16.0)
MCH: 28.6 pg (ref 26.0–34.0)
MCHC: 33.2 g/dL (ref 32.0–36.0)
MCV: 86.4 fL (ref 80.0–100.0)
Platelets: 76 10*3/uL — ABNORMAL LOW (ref 150–440)
RBC: 2.36 MIL/uL — ABNORMAL LOW (ref 3.80–5.20)
RDW: 18.6 % — ABNORMAL HIGH (ref 11.5–14.5)
WBC: 7 10*3/uL (ref 3.6–11.0)

## 2015-08-25 LAB — GLUCOSE, CAPILLARY
GLUCOSE-CAPILLARY: 84 mg/dL (ref 65–99)
GLUCOSE-CAPILLARY: 87 mg/dL (ref 65–99)
GLUCOSE-CAPILLARY: 76 mg/dL (ref 65–99)
GLUCOSE-CAPILLARY: 70 mg/dL (ref 65–99)
Glucose-Capillary: 69 mg/dL (ref 65–99)
Glucose-Capillary: 71 mg/dL (ref 65–99)
Glucose-Capillary: 81 mg/dL (ref 65–99)
Glucose-Capillary: 89 mg/dL (ref 65–99)

## 2015-08-25 LAB — MAGNESIUM: Magnesium: 1.8 mg/dL (ref 1.7–2.4)

## 2015-08-25 LAB — PHOSPHORUS: Phosphorus: 2.1 mg/dL — ABNORMAL LOW (ref 2.5–4.6)

## 2015-08-25 LAB — APTT: aPTT: 43 seconds — ABNORMAL HIGH (ref 24–36)

## 2015-08-25 MED ORDER — LIDOCAINE 5 % EX PTCH
1.0000 | MEDICATED_PATCH | CUTANEOUS | Status: DC
  Administered 2015-08-25 – 2015-12-06 (×107): 1 via TRANSDERMAL
  Filled 2015-08-25 (×128): qty 1

## 2015-08-25 MED ORDER — SODIUM CHLORIDE 0.9 % IV SOLN
Freq: Once | INTRAVENOUS | Status: AC
  Administered 2015-08-25: 30 mL via INTRAVENOUS

## 2015-08-25 MED ORDER — ASPIRIN 81 MG PO CHEW
324.0000 mg | CHEWABLE_TABLET | Freq: Every day | ORAL | Status: DC
  Administered 2015-08-29 – 2015-08-31 (×3): 324 mg via ORAL
  Filled 2015-08-25 (×3): qty 4

## 2015-08-25 MED ORDER — DEXTROSE 50 % IV SOLN
25.0000 mL | Freq: Once | INTRAVENOUS | Status: AC
  Administered 2015-08-25: 25 mL via INTRAVENOUS
  Filled 2015-08-25: qty 50

## 2015-08-25 MED ORDER — LOPERAMIDE HCL 2 MG PO CAPS
2.0000 mg | ORAL_CAPSULE | ORAL | Status: DC | PRN
  Administered 2015-08-30 – 2015-08-31 (×3): 2 mg via ORAL
  Filled 2015-08-25 (×5): qty 1

## 2015-08-25 MED ORDER — MORPHINE SULFATE (PF) 2 MG/ML IV SOLN
2.0000 mg | INTRAVENOUS | Status: DC | PRN
  Administered 2015-08-25 – 2015-08-31 (×7): 2 mg via INTRAVENOUS
  Filled 2015-08-25 (×7): qty 1

## 2015-08-25 NOTE — Consult Note (Signed)
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 All present on admission  Measurement:Sacrum: 18 cm x 12.5 cm x 4 cm with palpable bone in wound bed. 40% gray slough, 50% pale pink tissue and 10% muscle/tendon. Undermining present from 8 to 12 o'clock, extending 1 cm.  Right lateral foot 100% soft slough.  Some autolysis occuring. Right trochanter 3.6 cm x 1 cm x 0.1 cm 10% devitalized tissue 90% pale pink tissue.   Drainage (amount, consistency, odor) Moderate serosanguinous drainage sacrum Minimal serosanguinous right foot.  Minimal serosanguinous to right hip  Periwound:Perineal redness consistent with moisture associated skin damage from incontinence.  Patient had noted bleeding through rectal tube.  GI asked that this be discontinued and it was removed this AM.  Lengthy discussion with daughter (at bedside) and spouse (via phone) regarding keeping the wound free from stool and risk of infection with contamination. Spouse does not wish to consider a diverting colostomy at this time. Spouse inquires about connecting wall suction and placing in the rectal area to suction stool away.  Informed that this would not work, there were blood clots and stool matter and we would provide incontinence and skin care when needed.  Dr Wynelle Link at bedside and confirmed that prompt incontinence care was the best option for now and wound care that keeps wound clean and away from contamination.     Dressing procedure/placement/frequency: Cleanse right foot daily with NS and apply dry dressing.  Cleanse right hip with NS and pat gently dry. Apply Allevyn silicone border foam. Change every 3 days and PRn soilage Cleanse sacral ulcer with NS and pat gently dry. Gently fill wound bed with Dakin's moistened gauze. Cover with 4x4 gauze and ABD pad. Change each shift and PRN soilage.  Will not follow at  this time.  Please re-consult if needed.  Maple Hudson RN BSN CWON Pager [phone]

## 2015-08-25 NOTE — Progress Notes (Signed)
Removed adhesive of dobhoff and notice device related ulcer stage 2 . Cleansed ulcer and shifted dobhoff more to right and placed new adhesive anchor.

## 2015-08-25 NOTE — Progress Notes (Signed)
Subjective:  daughter at bedside. Off norepinephrine for more than 24 hours  CRRT 2 K bath with UF of 5ml/hr. Hyperkalemia yesterday after given IV potassium   Objective:  Vital signs in last 24 hours:  Temp:  [97.2 F (36.2 C)-98.6 F (37 C)] 97.2 F (36.2 C) (10/06 0800) Pulse Rate:  [99-115] 103 (10/06 0900) Resp:  [0-26] 20 (10/06 0900) BP: (92-136)/(32-110) 100/73 mmHg (10/06 0900) SpO2:  [97 %-100 %] 100 % (10/06 0900) FiO2 (%):  [30 %-40 %] 30 % (10/06 0600) Weight:  [89.1 kg (196 lb 6.9 oz)] 89.1 kg (196 lb 6.9 oz) (10/06 0437)  Weight change:  Filed Weights   08/20/15 1600 08/22/15 0500 08/25/15 0437  Weight: 80.3 kg (177 lb 0.5 oz) 81.2 kg (179 lb 0.2 oz) 89.1 kg (196 lb 6.9 oz)    Intake/Output: I/O last 3 completed shifts: In: 1283 [I.V.:50; Blood:320; IV Piggyback:913] Out: 2039 [Other:1289; Stool:750]   Intake/Output this shift:  Total I/O In: -  Out: 96 [Other:96]  Physical Exam: General: Critically ill  Head: Normocephalic, atraumatic. +NGT   Eyes: Eyes open, sluggish to light.   Neck: Trach in place, 100% O2, left IJ catheter  Lungs:  Scattered rhonchi, normal effort  Heart: tachycardia  Abdomen:  Soft, nontender, BS present  Extremities:  +right upper extremity edema, trace edema of left upper extremity and dependent  Neurologic: Lethargic, opens eyes to verbal commands  Skin: No lesions  Access: R IJ permcath    Basic Metabolic Panel:  Recent Labs Lab 08/24/15 0141 08/24/15 0540 08/24/15 1648 08/24/15 2211 08/25/15 0426  NA 138 139  138 130* 139 141  K 3.6 4.3  4.2 6.6* 4.3 4.0  CL 108 107  107 99* 109 109  CO2 GLUCOSE 88 74  76 214* 74 76  BUN 23* 21*  21* CREATININE 0.53 0.43*  0.48 0.54 0.46 0.41*  CALCIUM 7.8* 8.1*  7.8* 7.4* 7.7* 7.9*  MG 2.1 2.0 1.9 1.9 1.8  PHOS 1.9* 1.7*  1.8* 8.6* 2.5 2.1*  2.1*    Liver Function Tests:  Recent Labs Lab 08/22/15 0539  08/24/15 0141  08/24/15 0540 08/24/15 1648 08/24/15 2211 08/25/15 0426  AST 22  --   --   --   --   --   --   ALT 19  --   --   --   --   --   --   ALKPHOS 454*  --   --   --   --   --   --   BILITOT 0.6  --   --   --   --   --   --   PROT 4.6*  --   --   --   --   --   --   ALBUMIN 2.0*  < > 2.1* 2.1* 2.1* 1.9* 1.8*  < > = values in this interval not displayed. No results for input(s): LIPASE, AMYLASE in the last 168 hours. No results for input(s): AMMONIA in the last 168 hours.  CBC:  Recent Labs Lab 08/21/15 0417  08/23/15 1527 08/23/15 2145 08/24/15 0543 08/24/15 1648 08/25/15 0426  WBC 11.0  < > 12.9* 11.2* 9.4 8.2 7.0  NEUTROABS 9.2*  --   --   --   --  7.0*  --   HGB 8.4*  < > 7.0* 8.8* 8.3* 8.0* 6.8*  HCT 25.9*  < >  21.4* 27.0* 24.6* 23.7* 20.4*  MCV 94.2  < > 89.0 85.3 85.0 86.1 86.4  PLT 60*  < > PLATELET CLUMPS NOTED ON SMEAR, UNABLE TO ESTIMATE 84* 87* 76* 76*  < > = values in this interval not displayed.  Cardiac Enzymes: No results for input(s): CKTOTAL, CKMB, CKMBINDEX, TROPONINI in the last 168 hours.  BNP: Invalid input(s): POCBNP  CBG:  Recent Labs Lab 08/24/15 2000 08/24/15 2357 08/25/15 0105 08/25/15 0403 08/25/15 0813  GLUCAP 83 67 89 84 81    Microbiology: Results for orders placed or performed during the hospital encounter of 08/27/2015  Blood Culture (routine x 2)     Status: None   Collection Time: 08-27-2015  8:51 AM  Result Value Ref Range Status   Specimen Description BLOOD [name]  Final   Special Requests BOTTLES DRAWN AEROBIC AND ANAEROBIC  3CC  Final   Culture NO GROWTH 5 DAYS  Final   Report Status 08/17/2015 FINAL  Final  Blood Culture (routine x 2)     Status: None   Collection Time: 2015/08/27  9:20 AM  Result Value Ref Range Status   Specimen Description BLOOD LEFT ARM  Final   Special Requests BOTTLES DRAWN AEROBIC AND ANAEROBIC  1CC  Final   Culture NO GROWTH 5 DAYS  Final   Report Status 08/17/2015 FINAL  Final  Wound culture      Status: None   Collection Time: August 27, 2015  9:20 AM  Result Value Ref Range Status   Specimen Description DECUBITIS  Final   Special Requests Normal  Final   Gram Stain   Final    FEW WBC SEEN MANY GRAM NEGATIVE RODS RARE GRAM POSITIVE COCCI    Culture   Final    HEAVY GROWTH ESCHERICHIA COLI MODERATE GROWTH ENTEROBACTER AEROGENES PROTEUS MIRABILIS HEAVY GROWTH ENTEROCOCCUS SPECIES VRE HAVE INTRINSIC RESISTANCE TO MOST COMMONLY USED ANTIBIOTICS AND THE ABILITY TO ACQUIRE RESISTANCE TO MOST AVAILABLE ANTIBIOTICS.    Report Status 08/16/2015 FINAL  Final   Organism ID, Bacteria ESCHERICHIA COLI  Final   Organism ID, Bacteria ENTEROBACTER AEROGENES  Final   Organism ID, Bacteria PROTEUS MIRABILIS  Final   Organism ID, Bacteria ENTEROCOCCUS SPECIES  Final      Susceptibility   Enterobacter aerogenes - MIC*    CEFTAZIDIME <=1 SENSITIVE Sensitive     CEFAZOLIN >=64 RESISTANT Resistant     CEFTRIAXONE <=1 SENSITIVE Sensitive     CIPROFLOXACIN <=0.25 SENSITIVE Sensitive     GENTAMICIN <=1 SENSITIVE Sensitive     IMIPENEM 1 SENSITIVE Sensitive     TRIMETH/SULFA <=20 SENSITIVE Sensitive     * MODERATE GROWTH ENTEROBACTER AEROGENES   Escherichia coli - MIC*    AMPICILLIN <=2 SENSITIVE Sensitive     CEFTAZIDIME <=1 SENSITIVE Sensitive     CEFAZOLIN <=4 SENSITIVE Sensitive     CEFTRIAXONE <=1 SENSITIVE Sensitive     CIPROFLOXACIN <=0.25 SENSITIVE Sensitive     GENTAMICIN <=1 SENSITIVE Sensitive     IMIPENEM <=0.25 SENSITIVE Sensitive     TRIMETH/SULFA <=20 SENSITIVE Sensitive     * HEAVY GROWTH ESCHERICHIA COLI   Proteus mirabilis - MIC*    AMPICILLIN >=32 RESISTANT Resistant     CEFTAZIDIME <=1 SENSITIVE Sensitive     CEFAZOLIN 8 SENSITIVE Sensitive     CEFTRIAXONE <=1 SENSITIVE Sensitive     CIPROFLOXACIN <=0.25 SENSITIVE Sensitive     GENTAMICIN <=1 SENSITIVE Sensitive     IMIPENEM 1 SENSITIVE Sensitive     TRIMETH/SULFA <=  20 SENSITIVE Sensitive     * PROTEUS MIRABILIS    Enterococcus species - MIC*    AMPICILLIN >=32 RESISTANT Resistant     VANCOMYCIN >=32 RESISTANT Resistant     GENTAMICIN SYNERGY SENSITIVE Sensitive     TETRACYCLINE Value in next row Resistant      RESISTANT>=16    * HEAVY GROWTH ENTEROCOCCUS SPECIES  MRSA PCR Screening     Status: None   Collection Time: 08-20-15  2:38 PM  Result Value Ref Range Status   MRSA by PCR NEGATIVE NEGATIVE Final    Comment:        The GeneXpert MRSA Assay (FDA approved for NASAL specimens only), is one component of a comprehensive MRSA colonization surveillance program. It is not intended to diagnose MRSA infection nor to guide or monitor treatment for MRSA infections.   Blood culture (single)     Status: None   Collection Time: 08-20-2015  3:36 PM  Result Value Ref Range Status   Specimen Description BLOOD RIGHT ASSIST CONTROL  Final   Special Requests BOTTLES DRAWN AEROBIC AND ANAEROBIC  Final   Culture NO GROWTH 5 DAYS  Final   Report Status 08/17/2015 FINAL  Final  Wound culture     Status: None   Collection Time: 08/13/15 12:37 PM  Result Value Ref Range Status   Specimen Description WOUND  Final   Special Requests Normal  Final   Gram Stain   Final    FEW WBC SEEN TOO NUMEROUS TO COUNT GRAM NEGATIVE RODS FEW GRAM POSITIVE COCCI    Culture   Final    HEAVY GROWTH ESCHERICHIA COLI MODERATE GROWTH PROTEUS MIRABILIS LIGHT GROWTH KLEBSIELLA PNEUMONIAE MODERATE GROWTH ENTEROCOCCUS GALLINARUM CRITICAL RESULT CALLED TO, READ BACK BY AND VERIFIED WITH: [facility] BORBA AT 1042 08/16/15 DV    Report Status 08/17/2015 FINAL  Final   Organism ID, Bacteria ESCHERICHIA COLI  Final   Organism ID, Bacteria PROTEUS MIRABILIS  Final   Organism ID, Bacteria KLEBSIELLA PNEUMONIAE  Final   Organism ID, Bacteria ENTEROCOCCUS GALLINARUM  Final      Susceptibility   Escherichia coli - MIC*    AMPICILLIN >=32 RESISTANT Resistant     CEFTAZIDIME 4 RESISTANT Resistant     CEFAZOLIN >=64 RESISTANT  Resistant     CEFTRIAXONE 16 RESISTANT Resistant     GENTAMICIN 2 SENSITIVE Sensitive     IMIPENEM >=16 RESISTANT Resistant     TRIMETH/SULFA <=20 SENSITIVE Sensitive     Extended ESBL POSITIVE Resistant     PIP/TAZO Value in next row Resistant      RESISTANT>=128    CIPROFLOXACIN Value in next row Sensitive      SENSITIVE<=0.25    * HEAVY GROWTH ESCHERICHIA COLI   Klebsiella pneumoniae - MIC*    AMPICILLIN Value in next row Resistant      SENSITIVE<=0.25    CEFTAZIDIME Value in next row Resistant      SENSITIVE<=0.25    CEFAZOLIN Value in next row Resistant      SENSITIVE<=0.25    CEFTRIAXONE Value in next row Resistant      SENSITIVE<=0.25    CIPROFLOXACIN Value in next row Resistant      SENSITIVE<=0.25    GENTAMICIN Value in next row Sensitive      SENSITIVE<=0.25    IMIPENEM Value in next row Resistant      SENSITIVE<=0.25    TRIMETH/SULFA Value in next row Resistant      SENSITIVE<=0.25    PIP/TAZO  Value in next row Resistant      RESISTANT>=128    * LIGHT GROWTH KLEBSIELLA PNEUMONIAE   Proteus mirabilis - MIC*    AMPICILLIN Value in next row Resistant      RESISTANT>=128    CEFTAZIDIME Value in next row Sensitive      RESISTANT>=128    CEFAZOLIN Value in next row Sensitive      RESISTANT>=128    CEFTRIAXONE Value in next row Sensitive      RESISTANT>=128    CIPROFLOXACIN Value in next row Sensitive      RESISTANT>=128    GENTAMICIN Value in next row Sensitive      RESISTANT>=128    IMIPENEM Value in next row Sensitive      RESISTANT>=128    TRIMETH/SULFA Value in next row Sensitive      RESISTANT>=128    PIP/TAZO Value in next row Sensitive      SENSITIVE<=4    * MODERATE GROWTH PROTEUS MIRABILIS   Enterococcus gallinarum - MIC*    AMPICILLIN Value in next row Resistant      SENSITIVE<=4    GENTAMICIN SYNERGY Value in next row Sensitive      SENSITIVE<=4    CIPROFLOXACIN Value in next row Resistant      RESISTANT>=8    TETRACYCLINE Value in next row  Resistant      RESISTANT>=16    * MODERATE GROWTH ENTEROCOCCUS GALLINARUM  Wound culture     Status: None   Collection Time: 08/15/15  2:53 PM  Result Value Ref Range Status   Specimen Description WOUND  Final   Special Requests NONE  Final   Gram Stain FEW WBC SEEN NO ORGANISMS SEEN   Final   Culture NO GROWTH 3 DAYS  Final   Report Status 08/18/2015 FINAL  Final  C difficile quick scan w PCR reflex     Status: None   Collection Time: 08/17/15 11:34 AM  Result Value Ref Range Status   C Diff antigen NEGATIVE NEGATIVE Final   C Diff toxin NEGATIVE NEGATIVE Final   C Diff interpretation Negative for C. difficile  Final    Coagulation Studies: No results for input(s): LABPROT, INR in the last 72 hours.  Urinalysis: No results for input(s): COLORURINE, LABSPEC, PHURINE, GLUCOSEU, HGBUR, BILIRUBINUR, KETONESUR, PROTEINUR, UROBILINOGEN, NITRITE, LEUKOCYTESUR in the last 72 hours.  Invalid input(s): APPERANCEUR    Imaging: Dg Chest 1 View  08/24/2015   CLINICAL DATA:  Shortness of breath.  EXAM: CHEST 1 VIEW  COMPARISON:  08/18/2015.  FINDINGS: Tracheostomy tube, feeding tube, left IJ line, right IJ dialysis catheter in stable position. Cardiomegaly with normal pulmonary vascularity. Mild right infrahilar infiltrate. Tiny left pleural effusion cannot be excluded. No pneumothorax.  IMPRESSION: 1. Lines and tubes in stable position. 2. Mild right infrahilar infiltrate. 3. Tiny left pleural effusion cannot be excluded . 4. Stable cardiomegaly.  No pulmonary venous congestion.   Electronically Signed   By: Maisie Fus  Register   On: 08/24/2015 07:24     Medications:   . norepinephrine (LEVOPHED) Adult infusion Stopped (08/24/15 0755)  . pureflow 3 each (08/25/15 0826)   . acidophilus  1 capsule Oral Daily  . antiseptic oral rinse  7 mL Mouth Rinse q12n4p  . aspirin  324 mg Oral Daily  . budesonide (PULMICORT) nebulizer solution  0.5 mg Nebulization BID  . chlorhexidine  15 mL Mouth  Rinse BID  . DAPTOmycin (CUBICIN)  IV  650 mg Intravenous Q48H  . epoetin (EPOGEN/PROCRIT)  injection  20,000 Units Subcutaneous Weekly  . free water  100 mL Per Tube 3 times per day  . hydrocortisone sod succinate (SOLU-CORTEF) inj  25 mg Intravenous Q8H  . insulin aspart  0-15 Units Subcutaneous 6 times per day  . ipratropium-albuterol  3 mL Nebulization BID  . meropenem (MERREM) IV  1 g Intravenous Q12H  . midodrine  10 mg Oral TID WC  . multivitamin  5 mL Per Tube Daily  . nystatin   Topical TID  . pantoprazole (PROTONIX) IV  40 mg Intravenous Q12H  . potassium phosphate IVPB (mmol)  15 mmol Intravenous Once  . sodium hypochlorite   Irrigation BID   sodium chloride, sodium chloride, alteplase, anticoagulant sodium citrate, lidocaine (PF), lidocaine-prilocaine, pentafluoroprop-tetrafluoroeth  Assessment/ Plan:  49 y.o. 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  1. End-stage renal disease on hemodialysis on HD TTHS. The patient has been on dialysis since October of 2014. R IJ permcath.  - Increased ultrafiltration to 39mL/hr.  - Continue CRRT due to hemodynamic instability. Hold intermittent hemodialysis.   2. Anemia of CKD: Hemoglobin 6.8, platelet count 76 - continue EPO 20000 weekly - PRBC transfusion ordered.   3. Secondary hyperparathyroidism: PTH low at 55. -  phos 2.1: IV phos ordered.   - low due to poor PO intake, loss of muscle mass and CRRT clearance.    4. Septic shock: Multiple potential sources, - multiple organisms growing via wound culture - appreciate ID recommendations: currently on meropenem and daptomycin.   Consider Palliative Care. Overall prognosis is poor. Discussed case with husband.    LOS: 13 Dariona Postma 10/6/20169:54 AM

## 2015-08-25 NOTE — Progress Notes (Signed)
Nutrition Follow-up       INTERVENTION:   Coordination of care: Discussed nutritional poc with Dr. Nicholos Johns and MD wanting to hold off on starting nutrition support today, reassess tomorrow.    NUTRITION DIAGNOSIS:   Inadequate oral intake related to wound healing, acute illness, chronic illness as evidenced by NPO status, estimated needs.    GOAL:   Patient will meet greater than or equal to 90% of their needs    MONITOR:    (Energy Intake, Anthropometrics, Digestive System, Electrolyte/Renal Profile, Glucose Profile)  REASON FOR ASSESSMENT:   Consult Enteral/tube feeding initiation and management  ASSESSMENT:      Pt continues on CRRT and trach collar.  Rectal tube has been removed per GI recommendation.  Noted Wound Care recommendations for diverting colostomy and discussion with husband.    Current Nutrition: NPO   Gastrointestinal Profile: rectal tube removed this am Last BM: out in last 24 hr per I and O sheet (rectal tube)   Medications: K phosphate  Electrolyte/Renal Profile and Glucose Profile:   Recent Labs Lab 08/24/15 1648 08/24/15 2211 08/25/15 0426 08/25/15 1019  NA 130* 139 141 141  K 6.6* 4.3 4.0 3.9  CL 99* 109 109 109  CO2 BUN CREATININE 0.54 0.46 0.41* 0.50  CALCIUM 7.4* 7.7* 7.9* 7.9*  MG 1.9 1.9 1.8  --   PHOS 8.6* 2.5 2.1*  2.1* 1.9*  GLUCOSE 214* 74 76 85   Protein Profile:   Recent Labs Lab 08/24/15 2211 08/25/15 0426 08/25/15 1019  ALBUMIN 1.9* 1.8* 2.0*   Nutritional Anemia Profile:  CBC Latest Ref Rng 08/25/2015 08/24/2015 08/24/2015  WBC 3.6 - 11.0 K/uL 7.0 8.2 9.4  Hemoglobin 12.0 - 16.0 g/dL 1.6(X) 0.9(U) 8.3(L)  Hematocrit 35.0 - 47.0 % 20.4(L) 23.7(L) 24.6(L)  Platelets 150 - 440 K/uL 76(L) 76(L) 87(L)      Weight Trend since Admission: Filed Weights   08/20/15 1600 08/22/15 0500 08/25/15 0437  Weight: 177 lb 0.5 oz (80.3 kg) 179 lb 0.2 oz (81.2 kg) 196 lb 6.9 oz  (89.1 kg)      Diet Order:  Diet NPO time specified  Skin:   (stage IV ulcer on foot, unstageable on sacrum)   Height:   Ht Readings from Last 1 Encounters:  08/06/2015  (1.753 m)    Weight:   Wt Readings from Last 1 Encounters:  08/25/15 196 lb 6.9 oz (89.1 kg)      BMI:  Body mass index is 28.99 kg/(m^2).  Estimated Nutritional Needs:   Kcal:  0454-0981 kcals (BEE 1434, 1.2 AF, 1.2-1.4 IF)   Protein:  113-150 g/kg (1.5-2.0 g/kg) but may be closer to 150-188 g (2.0-2.5 g/kg)   Fluid:  1000 mL plus UOP  EDUCATION NEEDS:   No education needs identified at this time  HIGH Care Level  Courtney [name], RD, LDN [phone] (pager)

## 2015-08-25 NOTE — Progress Notes (Signed)
Patient remains critically ill. Her hemoglobin was 6.8  and platelet count 76 earlier today. No DIC or HIT, etiology is likely multifactorial including active GI bleed.  Continue to monitor daily CBC and transfuse if hemoglobin falls below 7.0. Transfuse platelets if they decrease below 10,000 or if continued GI bleeding, maintain over 50,000  Will follow from a distance, call with questions.

## 2015-08-25 NOTE — Progress Notes (Signed)
[name] Progess Note    ASSESSMENT/PLAN  Patient is status post Gastric bypass with sleeve gastrectomy March 2014 at [facility] complicated by SBO requiring lysis of adhesions with subsequent small bowel enterotomy resulting in shock and multiple cardiac arrests, chronic respiratory failure s/p tracheostomy, DVT/PE on coumadin, HTN, DM2, OSA, COPD, ESRD. Intra-abdominal abscesses s/p lap LOA and drainage 03/2013. Subsequently has experienced numerous infections and hospitalizations since that time. Most recently on this admission she is admitted for severe septic shock and acute kidney injury as well as chronically contaminated decubitus ulcer. Her course on this admission has been, complicated by ischemic stroke, likely cardioembolic, and GI bleeding suspected to be from a rectal ulcer complicated by presence of a fecal tube.    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 History of obstructive sleep apnea History of COPD P:  Supplemental O2 to maintain SpO2 90-96% If requires vent support, will need to change trach to cuffed Currently doing well trach collar and was not required ventilator support. -Patient is noted to have copious secretions with concomitant drop in her oxygen saturation. This response to suctioning.   CARDIOVASCULAR A:  -Initial septic shock on pressors, subsequently developed hemorrhagic shock. Currently weaned off of all pressors. -Chronic steroids - possible component of adrenal insufficiency,currently on steroids. -Nonsustained ventricular tachycardia; history of coronary artery disease, diastolic congestive heart failure, as well as monomorphic and polymorphic ventricular tachycardia on previous admissions, which had responded to amiodarone. -History of cardiac arrest 3. -Echocardiogram from 08/22/2015 results were reviewed; ejection fraction was  55-60%, pulmonary systolic pressure was 39 mmHg P:  MAP goal > 60 mmHg Taper stress dose steroids as permitted by BP -The patient's episodes of intermittent tachycardia have improved, likely been related to hypokalemia as well as hypotension at the time. The amiodarone infusion has been stopped, we'll continue to monitor.  RENAL A:  ESRD - now on CRRT.  Hypokalemia, resolved P:  Monitor BMET intermittently Monitor I/Os Correct electrolytes as indicated Discussed with nephrology. Plan to change to hemodialysis tomorrow  GASTROINTESTINAL A:  -Gastrointestinal bleeding with maroon colored stools in the fecal bag. Discussed with gastroneurology this was thought to be due to a rectal ulcer. Bleeding was associated with hypotension requiring pressure support and transfusion of blood. This appears to be improved. Given the patient's recurrent sepsis episodes from the decubitus ulcer I will keep the rectal tube in place for the time being and monitor the patient closely. This was discussed in detail with the patient's husband  at the bedside, who is in agreement with this plan, and is aware of the risks and benefits associated with maintaining the rectal tube in place. Upon evaluation this morning, however, the patient's hemoglobin has continued to drop, today to 6.8. She will therefore be given a transfusion, and rectal tube will be removed. I discussed with the patient's husband and the critical care RN in detail regarding possible risk of contamination of her decubitus ulcer. He would like [place] to rig up a system wherein we create a damn between her anus and the decubitus ulcer to try to prevent decontamination, as sequentially place a suction catheter on open suction into the area where the feces would otherwise collect to remove said stool. The swelling that there are risks with this approach, such as the catheter migrating and causing further damage including further pressure ulceration, as  well as difficulty placing a damn-like device which could  cause further problems. In addition, while preventing obvious contamination of the decubitus ulcer is desired, the presence of stool in this area will certainly lead to migration organisms and further infections. The gastroneurology services talked to the patient about the potential of having the patient undergo a diverting colostomy will be the best option to allow the patient to have her decubitus ulcer Heal without continued contamination, however, given that this is another interventional surgery, we would prefer to avoid this. We therefore explained to the husband that we will attempt to remove the tube and follow this process to try to prevent contamination as best as we can, but obviously this is not an ideal situation, with definite risks of complications.  -Hx of C. Diff - PCR neg -Incontinent of stool with chronic contamination of sacral decubitus ulcer.  P:  -Maintain rectal tube in place. Monitor closely. SUP: N/I unless requires vent support -tube feeds off today. We'll restart tomorrow if no further bleeding is appreciated. -No anticoagulation. -c.diff PCR>>neg -As we have not ruled out gastritis or peptic ulcer disease as a cause of the patient's Intestinal bleeding, we will continue IV Protonix with twice a day dosing. Can consider change to once daily dosing if no further episodes of bleeding.  HEMATOLOGIC A:  Acute on chronic anemia Thrombocytopenia, . Etiology unclear. Platelet counts have been improving P:  DVT px: SCDs Monitor CBC intermittently Transfuse per usual guidelines Check HIT panel 9/25 - negative -will give platelet transfusions as needed for platelet count less than 10; or was 20 with evidence of active bleeding.  INFECTIOUS A:  -Severe sepsis, resolved. Was secondary to infected sacral decubitus ulcer. -Sacral decubitus ulcer with abscess -decubitus ulcer with history of  osteomyelitis. 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 P:  Monitor temp, WBC count Micro and abx as above   ENDOCRINE A:  DM 2, controlled Chronic prednisone therapy - was on chronic dose 10 mg BID Secondary adrenal insuff P:  CBGs and SSI - mod scale -Stress dose steroids - wean as able based on BP; will decrease dose of hydrocortisone IV to 25 mg every 8 hours.  NEUROLOGIC A:  Acute encephalopathy, -somnolent still, suspect due to chronic sepsis with metabolic encephalopathy.  MRI suggest acute infarcts follow up Neuro recs  RASS goal: 0 Minimize sedating meds - baseline mentation is talking and following commands.     ADMISSION DATE: 09/09/2015 CONSULTATION DATE: 9/23  INITIAL PRESENTATION:  37 F who has been in medical facilities (hosp, LTAC, rehab) for 2 yrs following gastric bypass surgery with multiple complications. Now with chronic trach, ESRD, profound debilitation, severe sacral pressure ulcer. Was seemingly making progress and transferred to rehab facility approx one week prior to this admission. She was sent to [facility] ED with AMS and hypotension. Working dx of severe sepsis/septic shock due to infected sacral pressure ulcer. Also has R side externalized HD cath and tunneled L IJ CVL. She has history of resistant bacterial infections. She is on chronic steroids. Her husband indicates that she has been diagnosed with adrenal insuff at some point during the past 2 yrs  MAJOR EVENTS/TEST RESULTS:  Admission 02/07/14-05/07/14 Admission 07/21/14-09/06/14 Discharged to [facility]. Pt had palliative consult at that time, were asked to sign off by husband.  9/23 CT head: NAD 9/23 EEG: no epileptiform activity 9/23 PRBCs for Hgb 6.4 9/24 bedside debridement of sacral wound. Abscess drained 9/25  Off vasopressors. More alert.  No distress. Worsening thrombocytopenia. Vanc DC'd 9/29  Dr. Sampson Goon (I.D) excused from the case by patient's husband. 10/2 MRI -multiple infarcts 10/3 tracheal bleeding- transfused platelets 10/3 hospitalist service excused from the case by patient's husband 08/22/2015 Echocardiogram ejection fraction was 55-60%, pulmonary systolic pressure was 39 mmHg  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 History of sepsis from C. glabrata MRSA PCR 9/23 >> NEG Urine 9/23 >>  Wound (swab) 9/23 >> many GNR, rare GPC >>  Blood 9/23 >> neg  Wound (debridement) 9/24 >> heavy GNR >> CRE, Enterococcus, K. Pneumoniae, P. Mirabilis, VRE CDiff 9/27>>neg  ANTIMICROBIALS:  Aztreonam 9/23 >> 9/24 Vanc 9/23 >> 9/25 Vanc 9/26>>9/27 Meropenem 9/23 >>  Daptomycin 9/27>>   ---------------------------------------   ----------------------------------------   Name: Courtney [name] MRN: [id number] DOB: Mar 09, 1966    ADMISSION DATE:  08/06/2015    CHIEF COMPLAINT:  dyspnea     SUBJECTIVE:   Pt currently nonverbal, can not provide history or review of systems. However, she appears to be awake and opens eyes spontaneously. The patient is nonverbal. Her mental status appears improved from yesterday, she does not appear to move her right side spontaneously.  Review of Systems:  Pt currently nonverbal, can not provide history or review of systems.    VITAL SIGNS: Temp:  [97.2 F (36.2 C)-98.6 F (37 C)] 97.7 F (36.5 C) (10/06 1345) Pulse Rate:  [82-115] 92 (10/06 1354) Resp:  [9-27] 15 (10/06 1354) BP: (92-136)/(42-110) 105/92 mmHg (10/06 1345) SpO2:  [97 %-100 %] 100 % (10/06 1354) FiO2 (%):  [30 %-40 %] 30 % (10/06 0600) Weight:  [89.1 kg (196 lb 6.9 oz)] 89.1  kg (196 lb 6.9 oz) (10/06 0437) HEMODYNAMICS:   VENTILATOR SETTINGS: Vent Mode:  [-]  FiO2 (%):  [30 %-40 %] 30 % INTAKE / OUTPUT:  Intake/Output Summary (Last 24 hours) at 08/25/15 1407 Last data filed at 08/25/15 1354  Gross per 24 hour  Intake    313 ml  Output   1813 ml  Net  -1500 ml    PHYSICAL EXAMINATION: Physical Examination:   VS: BP 105/92 mmHg  Pulse 92  Temp(Src) 97.7 F (36.5 C) (Oral)  Resp 15  Ht  (1.753 m)  Wt 89.1 kg (196 lb 6.9 oz)  BMI 28.99 kg/m2  SpO2 100%  General Appearance: No distress  Neuro:, mental status reduced, though better than yesterday. Right upper and lower extremity strength 0 out of 5 left upper and left lower extremity strength, 1 out of 5 HEENT: PERRLA, EOM intact. Pulmonary: normal breath sounds   CardiovascularNormal S1,S2.  No m/r/g.   Abdomen: Benign, Soft, non-tender. Renal:  No costovertebral tenderness  GU:  Not performed at this time. Endocrine: No evident thyromegaly. Skin:   warm, no rashes, no ecchymosis  Extremities: normal, no cyanosis, clubbing.   LABS:   LABORATORY PANEL:   CBC  Recent Labs Lab 08/25/15 0426  WBC 7.0  HGB 6.8*  HCT 20.4*  PLT 76*    Chemistries   Recent Labs Lab 08/22/15 0539  08/25/15 0426 08/25/15 1019  NA 139  < > 141 141  K 4.2  < > 4.0 3.9  CL 105  < > 109 109  CO2 30  < > 29 28  GLUCOSE  126*  < > 76 85  BUN 26*  < > 13 13  CREATININE 0.60  < > 0.41* 0.50  CALCIUM 8.0*  < > 7.9* 7.9*  MG 1.8  < > 1.8  --   PHOS 2.0*  < > 2.1*  2.1* 1.9*  AST 22  --   --   --   ALT 19  --   --   --   ALKPHOS 454*  --   --   --   BILITOT 0.6  --   --   --   < > = values in this interval not displayed.   Recent Labs Lab 08/24/15 2000 08/24/15 2357 08/25/15 0105 08/25/15 0403 08/25/15 0813 08/25/15 1151  GLUCAP 83 67 89 84 81 87   No results for input(s): PHART, PCO2ART, PO2ART in the last 168 hours.  Recent Labs Lab 08/22/15 0539  08/24/15 2211 08/25/15 0426  08/25/15 1019  AST 22  --   --   --   --   ALT 19  --   --   --   --   ALKPHOS 454*  --   --   --   --   BILITOT 0.6  --   --   --   --   ALBUMIN 2.0*  < > 1.9* 1.8* 2.0*  < > = values in this interval not displayed.  Cardiac Enzymes No results for input(s): TROPONINI in the last 168 hours.  RADIOLOGY:  Dg Chest 1 View  08/24/2015   CLINICAL DATA:  Shortness of breath.  EXAM: CHEST 1 VIEW  COMPARISON:  08/18/2015.  FINDINGS: Tracheostomy tube, feeding tube, left IJ line, right IJ dialysis catheter in stable position. Cardiomegaly with normal pulmonary vascularity. Mild right infrahilar infiltrate. Tiny left pleural effusion cannot be excluded. No pneumothorax.  IMPRESSION: 1. Lines and tubes in stable position. 2. Mild right infrahilar infiltrate. 3. Tiny left pleural effusion cannot be excluded . 4. Stable cardiomegaly.  No pulmonary venous congestion.   Electronically Signed   By: Maisie Fus  Register   On: 08/24/2015 07:24       --Wells Guiles, MD.   [facility]   Santiago Glad, M.D.  Stephanie Acre, M.D.  Billy Fischer, M.D  Critical Care Attestation.  I have personally obtained a history, examined the patient, evaluated laboratory and imaging results, formulated the assessment and plan and placed orders. I discussed and coordinated care of this patient with the patient's husband by telephone, patient's daughter at bedside, critical care nurse, ICU pharmacist, nutrition therapy, social worker, patient's nephrologist. The Patient requires high complexity decision making for assessment and supp course ort, frequent evaluation and titration of therapies, application of advanced monitoring technologies and extensive interpretation of multiple databases. The patient has critical illness that could lead imminently to failure of 1 or more organ systems and requires the highest level of physician preparedness to intervene.  Critical Care Time devoted to patient care  services described in this note is 60 minutes and is exclusive of time spent in procedures.

## 2015-08-25 NOTE — Progress Notes (Signed)
NEUROLOGY NOTE  S: A lot more alert today  ROS unobtainable to aphasia  O: 97.2    104/77    109    12 No distress, overweight Normocephalic, oropharynx clear Supple, no JVD CTA B, no wheezing tachycardic, no murmur Diffuse edema, no C/C  Trached, not sedated, eyes open and tracks, rarely follows, mute Pupils 4mm and sluggish, weak R corneal, good L corneal, R droop Localizes on L, trace movement on R  A/P: 1. Multiple embolic acute infarcts- Etiology still appears to be cardioembolic to me. This will still cause permanent deficits of R hemiparesis and aphasia which will not likely get better;  Pt has problems following still 2. Encephalopathy- Appears resolved now - Will start neurostimulation once pt is stable from GI standpoint - Still needs TEE when stable but now pt cant be anticoagulated anyhow - Hold ASA for now due to GI Bleed - Try to keep Hgb > 8 - Keep MAP > 60 for good perfusion - Antibiotics per ID but would cover for possible endocarditis -  Discussed plan with daughter - Will still follow with you

## 2015-08-25 NOTE — Progress Notes (Signed)
Patient more alert throughout shift.  Follows commands--purposeful use of left arm; small flicker of fingers in right hand; answers questions appropriately.  Tracking staff and her daughter in room.  Vitals stable.  SR/ST on cardiac monitor, BP stable.  Trach collar at 30%.  Patient with small to moderate pink tinged, frothy secretions from trach and mouth.  CRRT operating without problems.  Flexiseal remains in place with dark red/green stool.  Wound care nurse to see patient today to evaluate sacral decubitus so flexiseal can be removed.  Hemoglobin 6.8 this morning.  Report given to day shift RN.

## 2015-08-25 NOTE — Progress Notes (Signed)
PHARMACY - CRITICAL CARE PROGRESS NOTE  Pharmacy Consult for CRRT Medication Adjustment    Allergies  Allergen Reactions  . Contrast Media [Iodinated Diagnostic Agents] Anaphylaxis  . Ampicillin Rash    Patient Measurements: Height: 5\' 9"  (175.3 cm) Weight: 196 lb 6.9 oz (89.1 kg) IBW/kg (Calculated) : 66.2   Vital Signs: Temp: 97.3 F (36.3 C) (10/06 2300) Temp Source: Axillary (10/06 1900) BP: 106/78 mmHg (10/06 2300) Pulse Rate: 75 (10/06 2300) Intake/Output from previous day: 10/05 0701 - 10/06 0700 In: 816.6 [I.V.:3.6; IV Piggyback:813] Out: 1797 [Stool:750] Intake/Output from this shift: Total I/O In: 100 [IV Piggyback:100] Out: 145 [Other:145] Vent settings for last 24 hours: Vent Mode:  [-]  FiO2 (%):  [30 %-35 %] 30 %  Labs:  Recent Labs  08/23/15 0515  08/24/15 0540 08/24/15 0543 08/24/15 1648 08/24/15 2211 08/25/15 0426 08/25/15 1019 08/25/15 1608 08/25/15 1616 08/25/15 2103  WBC  --   < >  --  9.4 8.2  --  7.0  --   --   --   --   HGB  --   < >  --  8.3* 8.0*  --  6.8*  --   --  8.9*  --   HCT  --   < >  --  24.6* 23.7*  --  20.4*  --   --  26.7*  --   PLT  --   < >  --  87* 76*  --  76*  --   --   --   --   APTT 54*  --  43*  --   --   --  43*  --   --   --   --   CREATININE 0.51  0.55  < > 0.43*  0.48  --  0.54 0.46 0.41* 0.50 0.43*  --  0.41*  MG 1.7  < > 2.0  --  1.9 1.9 1.8  --   --   --   --   PHOS 1.6*  < > 1.7*  1.8*  --  8.6* 2.5 2.1*  2.1* 1.9* 1.9*  --  1.9*  ALBUMIN 2.5*  < > 2.1*  --  2.1* 1.9* 1.8* 2.0* 2.1*  --  2.1*  < > = values in this interval not displayed. Estimated Creatinine Clearance: 100.1 mL/min (by C-G formula based on Cr of 0.41).   Recent Labs  08/25/15 1151 08/25/15 1701 08/25/15 2131  GLUCAP 87 76 70    Microbiology: Recent Results (from the past 720 hour(s))  Culture, blood (routine x 2)     Status: None   Collection Time: 08/01/15  7:00 PM  Result Value Ref Range Status   Specimen Description  BLOOD RIGHT ANTECUBITAL  Final   Special Requests BOTTLES DRAWN AEROBIC ONLY 5CC  Final   Culture NO GROWTH 5 DAYS  Final   Report Status 08/06/2015 FINAL  Final  Culture, blood (routine x 2)     Status: None   Collection Time: 08/01/15  7:10 PM  Result Value Ref Range Status   Specimen Description BLOOD RIGHT HAND  Final   Special Requests IN PEDIATRIC BOTTLE 3CC  Final   Culture  Setup Time   Final    GRAM POSITIVE COCCI IN CLUSTERS AEROBIC BOTTLE ONLY CRITICAL RESULT CALLED TO, READ BACK BY AND VERIFIED WITH: D TAYLOR,RN AT 1115 08/02/15 BY L BENFIELD    Culture STAPHYLOCOCCUS SPECIES (COAGULASE NEGATIVE)  Final   Report Status 08/06/2015 FINAL  Final   Organism ID, Bacteria STAPHYLOCOCCUS SPECIES (COAGULASE NEGATIVE)  Final      Susceptibility   Staphylococcus species (coagulase negative) - MIC*    CIPROFLOXACIN >=8 RESISTANT Resistant     ERYTHROMYCIN <=0.25 SENSITIVE Sensitive     GENTAMICIN 8 INTERMEDIATE Intermediate     OXACILLIN >=4 RESISTANT Resistant     TETRACYCLINE <=1 SENSITIVE Sensitive     VANCOMYCIN 1 SENSITIVE Sensitive     TRIMETH/SULFA 160 RESISTANT Resistant     CLINDAMYCIN <=0.25 SENSITIVE Sensitive     RIFAMPIN <=0.5 SENSITIVE Sensitive     Inducible Clindamycin NEGATIVE Sensitive     * STAPHYLOCOCCUS SPECIES (COAGULASE NEGATIVE)  Culture, blood (routine x 2)     Status: None   Collection Time: 08/02/15  7:00 PM  Result Value Ref Range Status   Specimen Description BLOOD RIGHT ANTECUBITAL  Final   Special Requests BOTTLES DRAWN AEROBIC ONLY 10CC  Final   Culture  Setup Time   Final    GRAM POSITIVE COCCI IN CLUSTERS AEROBIC BOTTLE ONLY CRITICAL RESULT CALLED TO, READ BACK BY AND VERIFIED WITH: DR Gwenevere Abbot RN 1549 08/03/15 A BROWNING    Culture   Final    STAPHYLOCOCCUS SPECIES (COAGULASE NEGATIVE) SUSCEPTIBILITIES PERFORMED ON PREVIOUS CULTURE WITHIN THE LAST 5 DAYS.    Report Status 08/06/2015 FINAL  Final  Culture, blood (routine x 2)     Status:  None   Collection Time: 08/02/15  7:05 PM  Result Value Ref Range Status   Specimen Description BLOOD RIGHT HAND  Final   Special Requests BOTTLES DRAWN AEROBIC AND ANAEROBIC 10CC  Final   Culture NO GROWTH 5 DAYS  Final   Report Status 08/07/2015 FINAL  Final  Blood Culture (routine x 2)     Status: None   Collection Time: 07/30/2015  8:51 AM  Result Value Ref Range Status   Specimen Description BLOOD UNKO  Final   Special Requests BOTTLES DRAWN AEROBIC AND ANAEROBIC  3CC  Final   Culture NO GROWTH 5 DAYS  Final   Report Status 08/17/2015 FINAL  Final  Blood Culture (routine x 2)     Status: None   Collection Time: 08/15/2015  9:20 AM  Result Value Ref Range Status   Specimen Description BLOOD LEFT ARM  Final   Special Requests BOTTLES DRAWN AEROBIC AND ANAEROBIC  1CC  Final   Culture NO GROWTH 5 DAYS  Final   Report Status 08/17/2015 FINAL  Final  Wound culture     Status: None   Collection Time: 08/17/2015  9:20 AM  Result Value Ref Range Status   Specimen Description DECUBITIS  Final   Special Requests Normal  Final   Gram Stain   Final    FEW WBC SEEN MANY GRAM NEGATIVE RODS RARE GRAM POSITIVE COCCI    Culture   Final    HEAVY GROWTH ESCHERICHIA COLI MODERATE GROWTH ENTEROBACTER AEROGENES PROTEUS MIRABILIS HEAVY GROWTH ENTEROCOCCUS SPECIES VRE HAVE INTRINSIC RESISTANCE TO MOST COMMONLY USED ANTIBIOTICS AND THE ABILITY TO ACQUIRE RESISTANCE TO MOST AVAILABLE ANTIBIOTICS.    Report Status 08/16/2015 FINAL  Final   Organism ID, Bacteria ESCHERICHIA COLI  Final   Organism ID, Bacteria ENTEROBACTER AEROGENES  Final   Organism ID, Bacteria PROTEUS MIRABILIS  Final   Organism ID, Bacteria ENTEROCOCCUS SPECIES  Final      Susceptibility   Enterobacter aerogenes - MIC*    CEFTAZIDIME <=1 SENSITIVE Sensitive     CEFAZOLIN >=64 RESISTANT Resistant  CEFTRIAXONE <=1 SENSITIVE Sensitive     CIPROFLOXACIN <=0.25 SENSITIVE Sensitive     GENTAMICIN <=1 SENSITIVE Sensitive      IMIPENEM 1 SENSITIVE Sensitive     TRIMETH/SULFA <=20 SENSITIVE Sensitive     * MODERATE GROWTH ENTEROBACTER AEROGENES   Escherichia coli - MIC*    AMPICILLIN <=2 SENSITIVE Sensitive     CEFTAZIDIME <=1 SENSITIVE Sensitive     CEFAZOLIN <=4 SENSITIVE Sensitive     CEFTRIAXONE <=1 SENSITIVE Sensitive     CIPROFLOXACIN <=0.25 SENSITIVE Sensitive     GENTAMICIN <=1 SENSITIVE Sensitive     IMIPENEM <=0.25 SENSITIVE Sensitive     TRIMETH/SULFA <=20 SENSITIVE Sensitive     * HEAVY GROWTH ESCHERICHIA COLI   Proteus mirabilis - MIC*    AMPICILLIN >=32 RESISTANT Resistant     CEFTAZIDIME <=1 SENSITIVE Sensitive     CEFAZOLIN 8 SENSITIVE Sensitive     CEFTRIAXONE <=1 SENSITIVE Sensitive     CIPROFLOXACIN <=0.25 SENSITIVE Sensitive     GENTAMICIN <=1 SENSITIVE Sensitive     IMIPENEM 1 SENSITIVE Sensitive     TRIMETH/SULFA <=20 SENSITIVE Sensitive     * PROTEUS MIRABILIS   Enterococcus species - MIC*    AMPICILLIN >=32 RESISTANT Resistant     VANCOMYCIN >=32 RESISTANT Resistant     GENTAMICIN SYNERGY SENSITIVE Sensitive     TETRACYCLINE Value in next row Resistant      RESISTANT>=16    * HEAVY GROWTH ENTEROCOCCUS SPECIES  MRSA PCR Screening     Status: None   Collection Time: 08/18/2015  2:38 PM  Result Value Ref Range Status   MRSA by PCR NEGATIVE NEGATIVE Final    Comment:        The GeneXpert MRSA Assay (FDA approved for NASAL specimens only), is one component of a comprehensive MRSA colonization surveillance program. It is not intended to diagnose MRSA infection nor to guide or monitor treatment for MRSA infections.   Blood culture (single)     Status: None   Collection Time: 08/08/2015  3:36 PM  Result Value Ref Range Status   Specimen Description BLOOD RIGHT ASSIST CONTROL  Final   Special Requests BOTTLES DRAWN AEROBIC AND ANAEROBIC  Final   Culture NO GROWTH 5 DAYS  Final   Report Status 08/17/2015 FINAL  Final  Wound culture     Status: None   Collection Time:  08/13/15 12:37 PM  Result Value Ref Range Status   Specimen Description WOUND  Final   Special Requests Normal  Final   Gram Stain   Final    FEW WBC SEEN TOO NUMEROUS TO COUNT GRAM NEGATIVE RODS FEW GRAM POSITIVE COCCI    Culture   Final    HEAVY GROWTH ESCHERICHIA COLI MODERATE GROWTH PROTEUS MIRABILIS LIGHT GROWTH KLEBSIELLA PNEUMONIAE MODERATE GROWTH ENTEROCOCCUS GALLINARUM CRITICAL RESULT CALLED TO, READ BACK BY AND VERIFIED WITH: [facility] BORBA AT 1042 08/16/15 DV    Report Status 08/17/2015 FINAL  Final   Organism ID, Bacteria ESCHERICHIA COLI  Final   Organism ID, Bacteria PROTEUS MIRABILIS  Final   Organism ID, Bacteria KLEBSIELLA PNEUMONIAE  Final   Organism ID, Bacteria ENTEROCOCCUS GALLINARUM  Final      Susceptibility   Escherichia coli - MIC*    AMPICILLIN >=32 RESISTANT Resistant     CEFTAZIDIME 4 RESISTANT Resistant     CEFAZOLIN >=64 RESISTANT Resistant     CEFTRIAXONE 16 RESISTANT Resistant     GENTAMICIN 2 SENSITIVE Sensitive  IMIPENEM >=16 RESISTANT Resistant     TRIMETH/SULFA <=20 SENSITIVE Sensitive     Extended ESBL POSITIVE Resistant     PIP/TAZO Value in next row Resistant      RESISTANT>=128    CIPROFLOXACIN Value in next row Sensitive      SENSITIVE<=0.25    * HEAVY GROWTH ESCHERICHIA COLI   Klebsiella pneumoniae - MIC*    AMPICILLIN Value in next row Resistant      SENSITIVE<=0.25    CEFTAZIDIME Value in next row Resistant      SENSITIVE<=0.25    CEFAZOLIN Value in next row Resistant      SENSITIVE<=0.25    CEFTRIAXONE Value in next row Resistant      SENSITIVE<=0.25    CIPROFLOXACIN Value in next row Resistant      SENSITIVE<=0.25    GENTAMICIN Value in next row Sensitive      SENSITIVE<=0.25    IMIPENEM Value in next row Resistant      SENSITIVE<=0.25    TRIMETH/SULFA Value in next row Resistant      SENSITIVE<=0.25    PIP/TAZO Value in next row Resistant      RESISTANT>=128    * LIGHT GROWTH KLEBSIELLA PNEUMONIAE   Proteus  mirabilis - MIC*    AMPICILLIN Value in next row Resistant      RESISTANT>=128    CEFTAZIDIME Value in next row Sensitive      RESISTANT>=128    CEFAZOLIN Value in next row Sensitive      RESISTANT>=128    CEFTRIAXONE Value in next row Sensitive      RESISTANT>=128    CIPROFLOXACIN Value in next row Sensitive      RESISTANT>=128    GENTAMICIN Value in next row Sensitive      RESISTANT>=128    IMIPENEM Value in next row Sensitive      RESISTANT>=128    TRIMETH/SULFA Value in next row Sensitive      RESISTANT>=128    PIP/TAZO Value in next row Sensitive      SENSITIVE<=4    * MODERATE GROWTH PROTEUS MIRABILIS   Enterococcus gallinarum - MIC*    AMPICILLIN Value in next row Resistant      SENSITIVE<=4    GENTAMICIN SYNERGY Value in next row Sensitive      SENSITIVE<=4    CIPROFLOXACIN Value in next row Resistant      RESISTANT>=8    TETRACYCLINE Value in next row Resistant      RESISTANT>=16    * MODERATE GROWTH ENTEROCOCCUS GALLINARUM  Wound culture     Status: None   Collection Time: 08/15/15  2:53 PM  Result Value Ref Range Status   Specimen Description WOUND  Final   Special Requests NONE  Final   Gram Stain FEW WBC SEEN NO ORGANISMS SEEN   Final   Culture NO GROWTH 3 DAYS  Final   Report Status 08/18/2015 FINAL  Final  C difficile quick scan w PCR reflex     Status: None   Collection Time: 08/17/15 11:34 AM  Result Value Ref Range Status   C Diff antigen NEGATIVE NEGATIVE Final   C Diff toxin NEGATIVE NEGATIVE Final   C Diff interpretation Negative for C. difficile  Final    Medications:  Scheduled:  . acidophilus  1 capsule Oral Daily  . antiseptic oral rinse  7 mL Mouth Rinse q12n4p  . [START ON 08/29/2015] aspirin  324 mg Oral Daily  . budesonide (PULMICORT) nebulizer solution  0.5 mg Nebulization  BID  . chlorhexidine  15 mL Mouth Rinse BID  . DAPTOmycin (CUBICIN)  IV  650 mg Intravenous Q48H  . epoetin (EPOGEN/PROCRIT) injection  20,000 Units Subcutaneous  Weekly  . free water  100 mL Per Tube 3 times per day  . hydrocortisone sod succinate (SOLU-CORTEF) inj  25 mg Intravenous Q8H  . insulin aspart  0-15 Units Subcutaneous 6 times per day  . ipratropium-albuterol  3 mL Nebulization BID  . lidocaine  1 patch Transdermal Q24H  . meropenem (MERREM) IV  1 g Intravenous Q12H  . midodrine  10 mg Oral TID WC  . multivitamin  5 mL Per Tube Daily  . nystatin   Topical TID  . pantoprazole (PROTONIX) IV  40 mg Intravenous Q12H  . potassium phosphate IVPB (mmol)  15 mmol Intravenous Once  . sodium hypochlorite   Irrigation BID   Infusions:  . norepinephrine (LEVOPHED) Adult infusion Stopped (08/24/15 0755)  . pureflow 3 each (08/25/15 0826)   PRN: sodium chloride, sodium chloride, alteplase, anticoagulant sodium citrate, lidocaine (PF), lidocaine-prilocaine, loperamide, morphine injection, pentafluoroprop-tetrafluoroeth  Assessment: Pharmacy consulted to assist in dosing medications for CRRT in this 49 y/o F with ESRD currently unable to tolerate HD. Patient is on day 13 of antibiotics (day 9 of daptomycin and day 12 of meropenem).  Plan:  Medications are dosed appropriately for CRRT at present so no need for adjustment. Will continue to follow and adjust as necessary.   Shain Pauwels L 08/25/2015,11:33 PM

## 2015-08-25 NOTE — Progress Notes (Signed)
eLink Physician-Brief Progress Note Patient Name: Courtney [name] DOB: 1966-10-16 MRN: [id number]   Date of Service  08/25/2015  HPI/Events of Note  Husband request Lidoderm patch for chronic pain.   eICU Interventions  Will order: 1. Lidoderm Patch 5 mg to trunk near R hip now and Q 24 hours.      Intervention Category Intermediate Interventions: Pain - evaluation and management  Sommer,Steven Eugene 08/25/2015, 8:45 PM

## 2015-08-26 DIAGNOSIS — E43 Unspecified severe protein-calorie malnutrition: Secondary | ICD-10-CM | POA: Insufficient documentation

## 2015-08-26 LAB — BASIC METABOLIC PANEL
ANION GAP: 3 — AB (ref 5–15)
BUN: 11 mg/dL (ref 6–20)
CHLORIDE: 109 mmol/L (ref 101–111)
CO2: 29 mmol/L (ref 22–32)
CREATININE: 0.47 mg/dL (ref 0.44–1.00)
Calcium: 8 mg/dL — ABNORMAL LOW (ref 8.9–10.3)
GFR calc non Af Amer: >60 mL/min (ref >60)
Glucose, Bld: 79 mg/dL (ref 65–99)
POTASSIUM: 3.8 mmol/L (ref 3.5–5.1)
SODIUM: 141 mmol/L (ref 135–145)

## 2015-08-26 LAB — RENAL FUNCTION PANEL
ALBUMIN: 2.1 g/dL — AB (ref 3.5–5.0)
ALBUMIN: 2 g/dL — AB (ref 3.5–5.0)
ANION GAP: 5 (ref 5–15)
ANION GAP: 3 — AB (ref 5–15)
BUN: 11 mg/dL (ref 6–20)
BUN: 12 mg/dL (ref 6–20)
CALCIUM: 7.8 mg/dL — AB (ref 8.9–10.3)
CO2: 29 mmol/L (ref 22–32)
CO2: 28 mmol/L (ref 22–32)
Calcium: 8.2 mg/dL — ABNORMAL LOW (ref 8.9–10.3)
Chloride: 109 mmol/L (ref 101–111)
Chloride: 108 mmol/L (ref 101–111)
Creatinine, Ser: 0.44 mg/dL (ref 0.44–1.00)
Creatinine, Ser: 0.47 mg/dL (ref 0.44–1.00)
GFR calc Af Amer: >60 mL/min (ref >60)
GFR calc non Af Amer: >60 mL/min (ref >60)
GLUCOSE: 75 mg/dL (ref 65–99)
Glucose, Bld: 79 mg/dL (ref 65–99)
PHOSPHORUS: 1.7 mg/dL — AB (ref 2.5–4.6)
PHOSPHORUS: 1.8 mg/dL — AB (ref 2.5–4.6)
POTASSIUM: 4 mmol/L (ref 3.5–5.1)
Potassium: 3.8 mmol/L (ref 3.5–5.1)
SODIUM: 139 mmol/L (ref 135–145)
Sodium: 143 mmol/L (ref 135–145)

## 2015-08-26 LAB — TYPE AND SCREEN
ABO/RH(D): A NEG
Antibody Screen: NEGATIVE
UNIT DIVISION: 0
Unit division: 0
Unit division: 0
Unit division: 0

## 2015-08-26 LAB — CBC
HCT: 27.8 % — ABNORMAL LOW (ref 35.0–47.0)
HEMOGLOBIN: 9.2 g/dL — AB (ref 12.0–16.0)
MCH: 29.1 pg (ref 26.0–34.0)
MCHC: 33.2 g/dL (ref 32.0–36.0)
MCV: 87.8 fL (ref 80.0–100.0)
Platelets: 59 10*3/uL — ABNORMAL LOW (ref 150–440)
RBC: 3.16 MIL/uL — AB (ref 3.80–5.20)
RDW: 17.4 % — ABNORMAL HIGH (ref 11.5–14.5)
WBC: 10.2 10*3/uL (ref 3.6–11.0)

## 2015-08-26 LAB — GLUCOSE, CAPILLARY
GLUCOSE-CAPILLARY: 85 mg/dL (ref 65–99)
GLUCOSE-CAPILLARY: 79 mg/dL (ref 65–99)
GLUCOSE-CAPILLARY: 79 mg/dL (ref 65–99)
GLUCOSE-CAPILLARY: 82 mg/dL (ref 65–99)
Glucose-Capillary: 67 mg/dL (ref 65–99)
Glucose-Capillary: 74 mg/dL (ref 65–99)
Glucose-Capillary: 85 mg/dL (ref 65–99)

## 2015-08-26 LAB — APTT: APTT: 41 s — AB (ref 24–36)

## 2015-08-26 MED ORDER — POTASSIUM PHOSPHATES 15 MMOLE/5ML IV SOLN
10.0000 mmol | Freq: Once | INTRAVENOUS | Status: DC
  Filled 2015-08-26: qty 3.33

## 2015-08-26 MED ORDER — SODIUM CHLORIDE 0.9 % IV SOLN
500.0000 mg | INTRAVENOUS | Status: DC
  Administered 2015-08-26 – 2015-08-30 (×3): 500 mg via INTRAVENOUS
  Filled 2015-08-26 (×3): qty 10

## 2015-08-26 MED ORDER — DEXTROSE 50 % IV SOLN
25.0000 mL | Freq: Once | INTRAVENOUS | Status: AC
  Administered 2015-08-26: 25 mL via INTRAVENOUS

## 2015-08-26 MED ORDER — POTASSIUM PHOSPHATES 15 MMOLE/5ML IV SOLN
10.0000 mmol | Freq: Once | INTRAVENOUS | Status: AC
  Administered 2015-08-26: 10 mmol via INTRAVENOUS
  Filled 2015-08-26: qty 3.33

## 2015-08-26 MED ORDER — DEXTROSE 5 % IV SOLN: 20.0000 mmol | Freq: Once | INTRAVENOUS | Status: DC

## 2015-08-26 MED ORDER — SODIUM CHLORIDE 0.9 % IV SOLN
500.0000 mg | Freq: Every day | INTRAVENOUS | Status: DC
  Administered 2015-08-27 – 2015-09-01 (×6): 500 mg via INTRAVENOUS
  Filled 2015-08-26 (×7): qty 0.5

## 2015-08-26 MED ORDER — DEXTROSE 50 % IV SOLN
INTRAVENOUS | Status: AC
  Administered 2015-08-26: 25 mL via INTRAVENOUS
  Filled 2015-08-26: qty 50

## 2015-08-26 NOTE — Progress Notes (Signed)
GI Note:  Hgb stable.  Some clots per rectum.   Recs: - ok to start tube feeds.  - if g-tube is desired, would not attempt endoscopic placement due to s/p gastric bypass and multiple abdominal surgeries. Would require surgical placement or possibly VIR.

## 2015-08-26 NOTE — Progress Notes (Signed)
Paged and spoke with Dr. Minda Ditto in regards to scheduled BMP that was part of protocol for CRRT. He stated to discontinue order for now and check a BMP for in the morning only.

## 2015-08-26 NOTE — Consult Note (Signed)
General Surgery Consult Note  CC: Sacral decubitus ulcer, ? Rectal ulcer from rectal tube, recent blood transfusion HPI: Courtney [name] is a pleasant 49 yo F with a history of multiple surgical issues following gastric sleeve and gastric bypass who presented with hypotension and decreased mental status.  Was seen by [place] previously for sacral decubitus wound.  Has been thrombocytopenic almost entirely throughout hospital course (short window 10/3-10/4, transfusing platelets).  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  Developed GI bleed which was concerning for ? Rectal ulcer from rectal tube but did not have any procedure done to determine this definitively.  Wound cared for nursing with dakins and was told by nurse that since rectal tube has been removed that it has been relatively easy to keep clean.   Past Medical History  Diagnosis Date  . Obesity   . Dyslipidemia   . Hypertension   . Coronary artery disease     s/p BMS 2010 LAD  . Dysrhythmia     ventricular tachycardia resolved after LAD stent and beta blocker  . Diabetes mellitus     with retinopathy, neuropathy and microalbuminemia  . ESRD (end stage renal disease) on dialysis    Past Surgical History  Procedure Laterality Date  . Cholecystectomy  1990  . Pci lad  12/2010  . Colonoscopy  08/2011  . Left heart catheterization with coronary angiogram N/A 01/28/2012    Procedure: LEFT HEART CATHETERIZATION WITH CORONARY ANGIOGRAM;  Surgeon: Lesleigh Noe, MD;  Location: [facility] CATH LAB;  Service: Cardiovascular;  Laterality: N/A;  . Tracheostomy    . Gastric bypass       Medication List    ASK your doctor about these medications        b complex-vitamin c-folic acid 0.8 MG Tabs tablet  Take 1 tablet by mouth every evening.     carvedilol 25 MG tablet  Commonly known as:  COREG  Take 25 mg by mouth every evening.     collagenase ointment  Commonly known as:  SANTYL  Apply 1 application topically  daily.     Darbepoetin Alfa 100 MCG/0.5ML Sosy injection  Commonly known as:  ARANESP  Inject 100 mcg into the skin every 7 (seven) days. On Saturday at dialysis     escitalopram 5 MG tablet  Commonly known as:  LEXAPRO  Take 5 mg by mouth at bedtime.     heparin 5000 UNIT/ML injection  Inject 5,000 Units into the skin every 8 (eight) hours.     insulin lispro 100 UNIT/ML injection  Commonly known as:  HUMALOG  Inject into the skin 4 (four) times daily -  before meals and at bedtime. Per sliding scale     insulin lispro 100 UNIT/ML injection  Commonly known as:  HUMALOG  Inject into the skin 3 (three) times daily. Per sliding scale     lidocaine 5 %  Commonly known as:  LIDODERM  Place 1 patch onto the skin daily. Remove & Discard patch within 12 hours or as directed by MD     lisinopril 40 MG tablet  Commonly known as:  PRINIVIL,ZESTRIL  Take 40 mg by mouth every evening.     megestrol 400 MG/10ML suspension  Commonly known as:  MEGACE  Take 800 mg by mouth 2 (two) times daily.     mirtazapine 15 MG tablet  Commonly known as:  REMERON  Take 15 mg by mouth at bedtime.  oxyCODONE 5 MG immediate release tablet  Commonly known as:  Oxy IR/ROXICODONE  Take 5 mg by mouth every 8 (eight) hours.     oxyCODONE 5 MG immediate release tablet  Commonly known as:  Oxy IR/ROXICODONE  Take 5 mg by mouth every 8 (eight) hours as needed for moderate pain.     potassium chloride SA 20 MEQ tablet  Commonly known as:  K-DUR,KLOR-CON  Take 20 mEq by mouth every evening.     predniSONE 10 MG tablet  Commonly known as:  DELTASONE  Take 10 mg by mouth 2 (two) times daily.     psyllium 58.6 % powder  Commonly known as:  METAMUCIL  Take 1 packet by mouth 2 (two) times daily.     thiamine 100 MG tablet  Commonly known as:  VITAMIN B-1  Take 100 mg by mouth every evening.     vancomycin 50 mg/mL oral solution  Commonly known as:  VANCOCIN  Take 125 mg by mouth every evening.   Ask about: Should I take this medication?       Allergies  Allergen Reactions  . Contrast Media [Iodinated Diagnostic Agents] Anaphylaxis  . Ampicillin Rash   Social History   Social History  . Marital Status: Married    Spouse Name: N/A  . Number of Children: N/A  . Years of Education: N/A   Occupational History  . Not on file.   Social History Main Topics  . Smoking status: Never Smoker   . Smokeless tobacco: Never Used  . Alcohol Use: No     Comment: occassional  . Drug Use: No  . Sexual Activity: Yes   Other Topics Concern  . Not on file   Social History Narrative   Family History  Problem Relation Age of Onset  . Hypertension Father   . Diabetes Father   . Cancer Mother   . Diabetes Paternal Grandfather   . Hypertension Paternal Grandfather   . Diabetes Paternal Grandmother   . Hypertension Paternal Grandmother   . Cancer Maternal Grandmother     colon ca   ROS: Unable to obtain  Blood pressure 119/60, pulse 91, temperature 97.3 F (36.3 C), temperature source Axillary, resp. rate 12, height 5\' 9"  (1.753 m), weight 182 lb 15.7 oz (83 kg), SpO2 100 %. 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  Although GI believes this is from possible rectal ulcer, patient is also thrombocytopenic and has been since admission and it is possible that bleed may be from elsewhere and that performing an ostomy in a patient with a history of multiple complications from a bariatric surgery would be quite morbid.  In addition, I feel that decubitus ulcer is relatively far from anus and should be able to be cared for without diverting ostomy.  Patient thrombocytopenia again makes difficult surgery possibly quite morbid, as does patients complicated anatomy.  I feel no indication for colonic diversion and that procedure would be morbid  without a significant diagnosis that her bleeding is indeed coming from a rectal ulcer.  In addition, I do not feel that with her deconditioning that any plastic surgeon would even attempt a flap until patient is able to move independently to prevent pressure on the flap.  If diversion is needed, I would recommend it be performed by her previous surgeons  at [facility] and [facility] due to anatomic issues.  Has not required transfusion in past day as well, this may be medical bleeding which would ultimately require correction of her thrombocytopenia and not stool diversion.

## 2015-08-26 NOTE — Progress Notes (Signed)
PHARMACY - CRITICAL CARE PROGRESS NOTE  Pharmacy Consult for CRRT Medication Adjustment    Allergies  Allergen Reactions  . Contrast Media [Iodinated Diagnostic Agents] Anaphylaxis  . Ampicillin Rash    Patient Measurements: Height: 5\' 9"  (175.3 cm) Weight: 182 lb 15.7 oz (83 kg) IBW/kg (Calculated) : 66.2   Vital Signs: Temp: 97.3 F (36.3 C) (10/07 0400) Temp Source: Axillary (10/07 0400) BP: 140/55 mmHg (10/07 1200) Pulse Rate: 88 (10/07 1200) Intake/Output from previous day: 10/06 0701 - 10/07 0700 In: 717 [Blood:337; IV Piggyback:200] Out: 1353 [Stool:200] Intake/Output from this shift: Total I/O In: 253.3 [IV Piggyback:253.3] Out: 47 [Other:47] Vent settings for last 24 hours: Vent Mode:  [-]  FiO2 (%):  [30 %] 30 %  Labs:  Recent Labs  08/24/15 0540  08/24/15 1648 08/24/15 2211 08/25/15 0426  08/25/15 1616 08/25/15 2103 08/26/15 0408 08/26/15 1144  WBC  --   < > 8.2  --  7.0  --   --   --  10.2  --   HGB  --   < > 8.0*  --  6.8*  --  8.9*  --  9.2*  --   HCT  --   < > 23.7*  --  20.4*  --  26.7*  --  27.8*  --   PLT  --   < > 76*  --  76*  --   --   --  59*  --   APTT 43*  --   --   --  43*  --   --   --  41*  --   CREATININE 0.43*  0.48  --  0.54 0.46 0.41*  < >  --  0.41* 0.44  0.47 0.47  MG 2.0  --  1.9 1.9 1.8  --   --   --   --   --   PHOS 1.7*  1.8*  --  8.6* 2.5 2.1*  2.1*  < >  --  1.9* 1.7* 1.8*  ALBUMIN 2.1*  --  2.1* 1.9* 1.8*  < >  --  2.1* 2.1* 2.0*  < > = values in this interval not displayed. Estimated Creatinine Clearance: 96.8 mL/min (by C-G formula based on Cr of 0.47).   Recent Labs  08/26/15 0356 08/26/15 0837 08/26/15 1152  GLUCAP 79 79 85    Microbiology: Recent Results (from the past 720 hour(s))  Culture, blood (routine x 2)     Status: None   Collection Time: 08/01/15  7:00 PM  Result Value Ref Range Status   Specimen Description BLOOD RIGHT ANTECUBITAL  Final   Special Requests BOTTLES DRAWN AEROBIC ONLY  5CC  Final   Culture NO GROWTH 5 DAYS  Final   Report Status 08/06/2015 FINAL  Final  Culture, blood (routine x 2)     Status: None   Collection Time: 08/01/15  7:10 PM  Result Value Ref Range Status   Specimen Description BLOOD RIGHT HAND  Final   Special Requests IN PEDIATRIC BOTTLE 3CC  Final   Culture  Setup Time   Final    GRAM POSITIVE COCCI IN CLUSTERS AEROBIC BOTTLE ONLY CRITICAL RESULT CALLED TO, READ BACK BY AND VERIFIED WITH: D TAYLOR,RN AT 1115 08/02/15 BY L BENFIELD    Culture STAPHYLOCOCCUS SPECIES (COAGULASE NEGATIVE)  Final   Report Status 08/06/2015 FINAL  Final   Organism ID, Bacteria STAPHYLOCOCCUS SPECIES (COAGULASE NEGATIVE)  Final      Susceptibility   Staphylococcus species (coagulase  negative) - MIC*    CIPROFLOXACIN >=8 RESISTANT Resistant     ERYTHROMYCIN <=0.25 SENSITIVE Sensitive     GENTAMICIN 8 INTERMEDIATE Intermediate     OXACILLIN >=4 RESISTANT Resistant     TETRACYCLINE <=1 SENSITIVE Sensitive     VANCOMYCIN 1 SENSITIVE Sensitive     TRIMETH/SULFA 160 RESISTANT Resistant     CLINDAMYCIN <=0.25 SENSITIVE Sensitive     RIFAMPIN <=0.5 SENSITIVE Sensitive     Inducible Clindamycin NEGATIVE Sensitive     * STAPHYLOCOCCUS SPECIES (COAGULASE NEGATIVE)  Culture, blood (routine x 2)     Status: None   Collection Time: 08/02/15  7:00 PM  Result Value Ref Range Status   Specimen Description BLOOD RIGHT ANTECUBITAL  Final   Special Requests BOTTLES DRAWN AEROBIC ONLY 10CC  Final   Culture  Setup Time   Final    GRAM POSITIVE COCCI IN CLUSTERS AEROBIC BOTTLE ONLY CRITICAL RESULT CALLED TO, READ BACK BY AND VERIFIED WITH: DR Gwenevere Abbot RN 1549 08/03/15 A BROWNING    Culture   Final    STAPHYLOCOCCUS SPECIES (COAGULASE NEGATIVE) SUSCEPTIBILITIES PERFORMED ON PREVIOUS CULTURE WITHIN THE LAST 5 DAYS.    Report Status 08/06/2015 FINAL  Final  Culture, blood (routine x 2)     Status: None   Collection Time: 08/02/15  7:05 PM  Result Value Ref Range Status    Specimen Description BLOOD RIGHT HAND  Final   Special Requests BOTTLES DRAWN AEROBIC AND ANAEROBIC 10CC  Final   Culture NO GROWTH 5 DAYS  Final   Report Status 08/07/2015 FINAL  Final  Blood Culture (routine x 2)     Status: None   Collection Time: 09/07/15  8:51 AM  Result Value Ref Range Status   Specimen Description BLOOD UNKO  Final   Special Requests BOTTLES DRAWN AEROBIC AND ANAEROBIC  3CC  Final   Culture NO GROWTH 5 DAYS  Final   Report Status 08/17/2015 FINAL  Final  Blood Culture (routine x 2)     Status: None   Collection Time: 2015-09-07  9:20 AM  Result Value Ref Range Status   Specimen Description BLOOD LEFT ARM  Final   Special Requests BOTTLES DRAWN AEROBIC AND ANAEROBIC  1CC  Final   Culture NO GROWTH 5 DAYS  Final   Report Status 08/17/2015 FINAL  Final  Wound culture     Status: None   Collection Time: 07-Sep-2015  9:20 AM  Result Value Ref Range Status   Specimen Description DECUBITIS  Final   Special Requests Normal  Final   Gram Stain   Final    FEW WBC SEEN MANY GRAM NEGATIVE RODS RARE GRAM POSITIVE COCCI    Culture   Final    HEAVY GROWTH ESCHERICHIA COLI MODERATE GROWTH ENTEROBACTER AEROGENES PROTEUS MIRABILIS HEAVY GROWTH ENTEROCOCCUS SPECIES VRE HAVE INTRINSIC RESISTANCE TO MOST COMMONLY USED ANTIBIOTICS AND THE ABILITY TO ACQUIRE RESISTANCE TO MOST AVAILABLE ANTIBIOTICS.    Report Status 08/16/2015 FINAL  Final   Organism ID, Bacteria ESCHERICHIA COLI  Final   Organism ID, Bacteria ENTEROBACTER AEROGENES  Final   Organism ID, Bacteria PROTEUS MIRABILIS  Final   Organism ID, Bacteria ENTEROCOCCUS SPECIES  Final      Susceptibility   Enterobacter aerogenes - MIC*    CEFTAZIDIME <=1 SENSITIVE Sensitive     CEFAZOLIN >=64 RESISTANT Resistant     CEFTRIAXONE <=1 SENSITIVE Sensitive     CIPROFLOXACIN <=0.25 SENSITIVE Sensitive     GENTAMICIN <=1 SENSITIVE Sensitive  IMIPENEM 1 SENSITIVE Sensitive     TRIMETH/SULFA <=20 SENSITIVE Sensitive     *  MODERATE GROWTH ENTEROBACTER AEROGENES   Escherichia coli - MIC*    AMPICILLIN <=2 SENSITIVE Sensitive     CEFTAZIDIME <=1 SENSITIVE Sensitive     CEFAZOLIN <=4 SENSITIVE Sensitive     CEFTRIAXONE <=1 SENSITIVE Sensitive     CIPROFLOXACIN <=0.25 SENSITIVE Sensitive     GENTAMICIN <=1 SENSITIVE Sensitive     IMIPENEM <=0.25 SENSITIVE Sensitive     TRIMETH/SULFA <=20 SENSITIVE Sensitive     * HEAVY GROWTH ESCHERICHIA COLI   Proteus mirabilis - MIC*    AMPICILLIN >=32 RESISTANT Resistant     CEFTAZIDIME <=1 SENSITIVE Sensitive     CEFAZOLIN 8 SENSITIVE Sensitive     CEFTRIAXONE <=1 SENSITIVE Sensitive     CIPROFLOXACIN <=0.25 SENSITIVE Sensitive     GENTAMICIN <=1 SENSITIVE Sensitive     IMIPENEM 1 SENSITIVE Sensitive     TRIMETH/SULFA <=20 SENSITIVE Sensitive     * PROTEUS MIRABILIS   Enterococcus species - MIC*    AMPICILLIN >=32 RESISTANT Resistant     VANCOMYCIN >=32 RESISTANT Resistant     GENTAMICIN SYNERGY SENSITIVE Sensitive     TETRACYCLINE Value in next row Resistant      RESISTANT>=16    * HEAVY GROWTH ENTEROCOCCUS SPECIES  MRSA PCR Screening     Status: None   Collection Time: August 19, 2015  2:38 PM  Result Value Ref Range Status   MRSA by PCR NEGATIVE NEGATIVE Final    Comment:        The GeneXpert MRSA Assay (FDA approved for NASAL specimens only), is one component of a comprehensive MRSA colonization surveillance program. It is not intended to diagnose MRSA infection nor to guide or monitor treatment for MRSA infections.   Blood culture (single)     Status: None   Collection Time: 19-Aug-2015  3:36 PM  Result Value Ref Range Status   Specimen Description BLOOD RIGHT ASSIST CONTROL  Final   Special Requests BOTTLES DRAWN AEROBIC AND ANAEROBIC  Final   Culture NO GROWTH 5 DAYS  Final   Report Status 08/17/2015 FINAL  Final  Wound culture     Status: None   Collection Time: 08/13/15 12:37 PM  Result Value Ref Range Status   Specimen Description WOUND  Final    Special Requests Normal  Final   Gram Stain   Final    FEW WBC SEEN TOO NUMEROUS TO COUNT GRAM NEGATIVE RODS FEW GRAM POSITIVE COCCI    Culture   Final    HEAVY GROWTH ESCHERICHIA COLI MODERATE GROWTH PROTEUS MIRABILIS LIGHT GROWTH KLEBSIELLA PNEUMONIAE MODERATE GROWTH ENTEROCOCCUS GALLINARUM CRITICAL RESULT CALLED TO, READ BACK BY AND VERIFIED WITH: [facility] BORBA AT 1042 08/16/15 DV    Report Status 08/17/2015 FINAL  Final   Organism ID, Bacteria ESCHERICHIA COLI  Final   Organism ID, Bacteria PROTEUS MIRABILIS  Final   Organism ID, Bacteria KLEBSIELLA PNEUMONIAE  Final   Organism ID, Bacteria ENTEROCOCCUS GALLINARUM  Final      Susceptibility   Escherichia coli - MIC*    AMPICILLIN >=32 RESISTANT Resistant     CEFTAZIDIME 4 RESISTANT Resistant     CEFAZOLIN >=64 RESISTANT Resistant     CEFTRIAXONE 16 RESISTANT Resistant     GENTAMICIN 2 SENSITIVE Sensitive     IMIPENEM >=16 RESISTANT Resistant     TRIMETH/SULFA <=20 SENSITIVE Sensitive     Extended ESBL POSITIVE Resistant  PIP/TAZO Value in next row Resistant      RESISTANT>=128    CIPROFLOXACIN Value in next row Sensitive      SENSITIVE<=0.25    * HEAVY GROWTH ESCHERICHIA COLI   Klebsiella pneumoniae - MIC*    AMPICILLIN Value in next row Resistant      SENSITIVE<=0.25    CEFTAZIDIME Value in next row Resistant      SENSITIVE<=0.25    CEFAZOLIN Value in next row Resistant      SENSITIVE<=0.25    CEFTRIAXONE Value in next row Resistant      SENSITIVE<=0.25    CIPROFLOXACIN Value in next row Resistant      SENSITIVE<=0.25    GENTAMICIN Value in next row Sensitive      SENSITIVE<=0.25    IMIPENEM Value in next row Resistant      SENSITIVE<=0.25    TRIMETH/SULFA Value in next row Resistant      SENSITIVE<=0.25    PIP/TAZO Value in next row Resistant      RESISTANT>=128    * LIGHT GROWTH KLEBSIELLA PNEUMONIAE   Proteus mirabilis - MIC*    AMPICILLIN Value in next row Resistant      RESISTANT>=128     CEFTAZIDIME Value in next row Sensitive      RESISTANT>=128    CEFAZOLIN Value in next row Sensitive      RESISTANT>=128    CEFTRIAXONE Value in next row Sensitive      RESISTANT>=128    CIPROFLOXACIN Value in next row Sensitive      RESISTANT>=128    GENTAMICIN Value in next row Sensitive      RESISTANT>=128    IMIPENEM Value in next row Sensitive      RESISTANT>=128    TRIMETH/SULFA Value in next row Sensitive      RESISTANT>=128    PIP/TAZO Value in next row Sensitive      SENSITIVE<=4    * MODERATE GROWTH PROTEUS MIRABILIS   Enterococcus gallinarum - MIC*    AMPICILLIN Value in next row Resistant      SENSITIVE<=4    GENTAMICIN SYNERGY Value in next row Sensitive      SENSITIVE<=4    CIPROFLOXACIN Value in next row Resistant      RESISTANT>=8    TETRACYCLINE Value in next row Resistant      RESISTANT>=16    * MODERATE GROWTH ENTEROCOCCUS GALLINARUM  Wound culture     Status: None   Collection Time: 08/15/15  2:53 PM  Result Value Ref Range Status   Specimen Description WOUND  Final   Special Requests NONE  Final   Gram Stain FEW WBC SEEN NO ORGANISMS SEEN   Final   Culture NO GROWTH 3 DAYS  Final   Report Status 08/18/2015 FINAL  Final  C difficile quick scan w PCR reflex     Status: None   Collection Time: 08/17/15 11:34 AM  Result Value Ref Range Status   C Diff antigen NEGATIVE NEGATIVE Final   C Diff toxin NEGATIVE NEGATIVE Final   C Diff interpretation Negative for C. difficile  Final    Medications:  Scheduled:  . acidophilus  1 capsule Oral Daily  . antiseptic oral rinse  7 mL Mouth Rinse q12n4p  . [START ON 08/29/2015] aspirin  324 mg Oral Daily  . budesonide (PULMICORT) nebulizer solution  0.5 mg Nebulization BID  . chlorhexidine  15 mL Mouth Rinse BID  . DAPTOmycin (CUBICIN)  IV  500 mg Intravenous Q48H  . epoetin (  EPOGEN/PROCRIT) injection  20,000 Units Subcutaneous Weekly  . free water  100 mL Per Tube 3 times per day  . hydrocortisone sod  succinate (SOLU-CORTEF) inj  25 mg Intravenous Q8H  . insulin aspart  0-15 Units Subcutaneous 6 times per day  . ipratropium-albuterol  3 mL Nebulization BID  . lidocaine  1 patch Transdermal Q24H  . [START ON 08/27/2015] meropenem (MERREM) IV  500 mg Intravenous q1800  . midodrine  10 mg Oral TID WC  . multivitamin  5 mL Per Tube Daily  . nystatin   Topical TID  . pantoprazole (PROTONIX) IV  40 mg Intravenous Q12H  . potassium phosphate IVPB (mmol)  10 mmol Intravenous Once  . potassium phosphate IVPB (mmol)  15 mmol Intravenous Once  . sodium hypochlorite   Irrigation BID   Infusions:  . norepinephrine (LEVOPHED) Adult infusion Stopped (08/24/15 0755)  . pureflow 3 each (08/25/15 0826)   PRN: sodium chloride, sodium chloride, alteplase, anticoagulant sodium citrate, lidocaine (PF), lidocaine-prilocaine, loperamide, morphine injection, pentafluoroprop-tetrafluoroeth  Assessment: Pharmacy consulted to assist in dosing medications for CRRT in this 49 y/o F with ESRD currently unable to tolerate HD.   Plan:  Nephrology trying patient off CRRT now with possible HD tomorrow. Will change dosing of abx accordingly. Will continue to follow and adjust as necessary.   Courtney [name] D 08/26/2015,2:12 PM

## 2015-08-26 NOTE — Progress Notes (Signed)
Subjective:   Continues on CRRT with UF of 18mL/hr 2K bath Off vasopressors.  Hemoglobin stable at 9.2 but platelets have dropped to 59,000   Objective:  Vital signs in last 24 hours:  Temp:  [97.3 F (36.3 C)-97.9 F (36.6 C)] 97.3 F (36.3 C) (10/07 0400) Pulse Rate:  [62-114] 91 (10/07 0700) Resp:  [2-27] 12 (10/07 0700) BP: (72-147)/(27-92) 119/60 mmHg (10/07 0700) SpO2:  [100 %] 100 % (10/07 0700) FiO2 (%):  [30 %] 30 % (10/07 0700) Weight:  [83 kg (182 lb 15.7 oz)] 83 kg (182 lb 15.7 oz) (10/07 0700)  Weight change: -6.1 kg (-13 lb 7.2 oz) Filed Weights   08/22/15 0500 08/25/15 0437 08/26/15 0700  Weight: 81.2 kg (179 lb 0.2 oz) 89.1 kg (196 lb 6.9 oz) 83 kg (182 lb 15.7 oz)    Intake/Output: I/O last 3 completed shifts: In: 817 [Blood:337; Other:180; IV Piggyback:300] Out: 2138 [Other:1688; Stool:450]   Intake/Output this shift:     Physical Exam: General: Critically ill  Head: Normocephalic, atraumatic. +NGT   Eyes: Eyes open, sluggish to light.   Neck: Trach in place, 100% O2, left IJ catheter  Lungs:  Scattered rhonchi, normal effort  Heart: tachycardia  Abdomen:  Soft, nontender, BS present  Extremities:  +right upper extremity edema, trace edema of left upper extremity and dependent  Neurologic: Lethargic, opens eyes to verbal commands  Skin: +sacral decub in dressings  Access: R IJ permcath    Basic Metabolic Panel:  Recent Labs Lab 08/24/15 0141 08/24/15 0540 08/24/15 1648 08/24/15 2211 08/25/15 0426 08/25/15 1019 08/25/15 1608 08/25/15 2103 08/26/15 0408  NA 138 139  138 130* 139 141 141 139 140 143  141  K 3.6 4.3  4.2 6.6* 4.3 4.0 3.9 3.9 3.9 4.0  3.8  CL 108 107  107 99* 109 109 109 107 110 109  109  CO2 28 27  27 24 27 29 28 29 28 29  29   GLUCOSE 88 74  76 214* 74 76 85 82 75 79  79  BUN 23* 21*  21* 16 15 13 13 12 12 11  11   CREATININE 0.53 0.43*  0.48 0.54 0.46 0.41* 0.50 0.43* 0.41* 0.44  0.47  CALCIUM 7.8* 8.1*   7.8* 7.4* 7.7* 7.9* 7.9* 8.1* 7.9* 8.2*  8.0*  MG 2.1 2.0 1.9 1.9 1.8  --   --   --   --   PHOS 1.9* 1.7*  1.8* 8.6* 2.5 2.1*  2.1* 1.9* 1.9* 1.9* 1.7*    Liver Function Tests:  Recent Labs Lab 08/22/15 0539  08/25/15 0426 08/25/15 1019 08/25/15 1608 08/25/15 2103 08/26/15 0408  AST 22  --   --   --   --   --   --   ALT 19  --   --   --   --   --   --   ALKPHOS 454*  --   --   --   --   --   --   BILITOT 0.6  --   --   --   --   --   --   PROT 4.6*  --   --   --   --   --   --   ALBUMIN 2.0*  < > 1.8* 2.0* 2.1* 2.1* 2.1*  < > = values in this interval not displayed. No results for input(s): LIPASE, AMYLASE in the last 168 hours. No results for input(s): AMMONIA in the  last 168 hours.  CBC:  Recent Labs Lab 08/21/15 0417  08/23/15 2145 08/24/15 0543 08/24/15 1648 08/25/15 0426 08/25/15 1616 08/26/15 0408  WBC 11.0  < > 11.2* 9.4 8.2 7.0  --  10.2  NEUTROABS 9.2*  --   --   --  7.0*  --   --   --   HGB 8.4*  < > 8.8* 8.3* 8.0* 6.8* 8.9* 9.2*  HCT 25.9*  < > 27.0* 24.6* 23.7* 20.4* 26.7* 27.8*  MCV 94.2  < > 85.3 85.0 86.1 86.4  --  87.8  PLT 60*  < > 84* 87* 76* 76*  --  59*  < > = values in this interval not displayed.  Cardiac Enzymes: No results for input(s): CKTOTAL, CKMB, CKMBINDEX, TROPONINI in the last 168 hours.  BNP: Invalid input(s): POCBNP  CBG:  Recent Labs Lab 08/25/15 1941 08/25/15 2131 08/25/15 2354 08/26/15 0116 08/26/15 0356  GLUCAP 71 70 69 85 79    Microbiology: Results for orders placed or performed during the hospital encounter of 07/21/2015  Blood Culture (routine x 2)     Status: None   Collection Time: 07/28/2015  8:51 AM  Result Value Ref Range Status   Specimen Description BLOOD [name]  Final   Special Requests BOTTLES DRAWN AEROBIC AND ANAEROBIC  3CC  Final   Culture NO GROWTH 5 DAYS  Final   Report Status 08/17/2015 FINAL  Final  Blood Culture (routine x 2)     Status: None   Collection Time: 08/10/2015  9:20 AM  Result  Value Ref Range Status   Specimen Description BLOOD LEFT ARM  Final   Special Requests BOTTLES DRAWN AEROBIC AND ANAEROBIC  1CC  Final   Culture NO GROWTH 5 DAYS  Final   Report Status 08/17/2015 FINAL  Final  Wound culture     Status: None   Collection Time: 07/23/2015  9:20 AM  Result Value Ref Range Status   Specimen Description DECUBITIS  Final   Special Requests Normal  Final   Gram Stain   Final    FEW WBC SEEN MANY GRAM NEGATIVE RODS RARE GRAM POSITIVE COCCI    Culture   Final    HEAVY GROWTH ESCHERICHIA COLI MODERATE GROWTH ENTEROBACTER AEROGENES PROTEUS MIRABILIS HEAVY GROWTH ENTEROCOCCUS SPECIES VRE HAVE INTRINSIC RESISTANCE TO MOST COMMONLY USED ANTIBIOTICS AND THE ABILITY TO ACQUIRE RESISTANCE TO MOST AVAILABLE ANTIBIOTICS.    Report Status 08/16/2015 FINAL  Final   Organism ID, Bacteria ESCHERICHIA COLI  Final   Organism ID, Bacteria ENTEROBACTER AEROGENES  Final   Organism ID, Bacteria PROTEUS MIRABILIS  Final   Organism ID, Bacteria ENTEROCOCCUS SPECIES  Final      Susceptibility   Enterobacter aerogenes - MIC*    CEFTAZIDIME <=1 SENSITIVE Sensitive     CEFAZOLIN >=64 RESISTANT Resistant     CEFTRIAXONE <=1 SENSITIVE Sensitive     CIPROFLOXACIN <=0.25 SENSITIVE Sensitive     GENTAMICIN <=1 SENSITIVE Sensitive     IMIPENEM 1 SENSITIVE Sensitive     TRIMETH/SULFA <=20 SENSITIVE Sensitive     * MODERATE GROWTH ENTEROBACTER AEROGENES   Escherichia coli - MIC*    AMPICILLIN <=2 SENSITIVE Sensitive     CEFTAZIDIME <=1 SENSITIVE Sensitive     CEFAZOLIN <=4 SENSITIVE Sensitive     CEFTRIAXONE <=1 SENSITIVE Sensitive     CIPROFLOXACIN <=0.25 SENSITIVE Sensitive     GENTAMICIN <=1 SENSITIVE Sensitive     IMIPENEM <=0.25 SENSITIVE Sensitive     TRIMETH/SULFA <=  20 SENSITIVE Sensitive     * HEAVY GROWTH ESCHERICHIA COLI   Proteus mirabilis - MIC*    AMPICILLIN >=32 RESISTANT Resistant     CEFTAZIDIME <=1 SENSITIVE Sensitive     CEFAZOLIN 8 SENSITIVE Sensitive      CEFTRIAXONE <=1 SENSITIVE Sensitive     CIPROFLOXACIN <=0.25 SENSITIVE Sensitive     GENTAMICIN <=1 SENSITIVE Sensitive     IMIPENEM 1 SENSITIVE Sensitive     TRIMETH/SULFA <=20 SENSITIVE Sensitive     * PROTEUS MIRABILIS   Enterococcus species - MIC*    AMPICILLIN >=32 RESISTANT Resistant     VANCOMYCIN >=32 RESISTANT Resistant     GENTAMICIN SYNERGY SENSITIVE Sensitive     TETRACYCLINE Value in next row Resistant      RESISTANT>=16    * HEAVY GROWTH ENTEROCOCCUS SPECIES  MRSA PCR Screening     Status: None   Collection Time: 08/19/2015  2:38 PM  Result Value Ref Range Status   MRSA by PCR NEGATIVE NEGATIVE Final    Comment:        The GeneXpert MRSA Assay (FDA approved for NASAL specimens only), is one component of a comprehensive MRSA colonization surveillance program. It is not intended to diagnose MRSA infection nor to guide or monitor treatment for MRSA infections.   Blood culture (single)     Status: None   Collection Time: 08/01/2015  3:36 PM  Result Value Ref Range Status   Specimen Description BLOOD RIGHT ASSIST CONTROL  Final   Special Requests BOTTLES DRAWN AEROBIC AND ANAEROBIC  Final   Culture NO GROWTH 5 DAYS  Final   Report Status 08/17/2015 FINAL  Final  Wound culture     Status: None   Collection Time: 08/13/15 12:37 PM  Result Value Ref Range Status   Specimen Description WOUND  Final   Special Requests Normal  Final   Gram Stain   Final    FEW WBC SEEN TOO NUMEROUS TO COUNT GRAM NEGATIVE RODS FEW GRAM POSITIVE COCCI    Culture   Final    HEAVY GROWTH ESCHERICHIA COLI MODERATE GROWTH PROTEUS MIRABILIS LIGHT GROWTH KLEBSIELLA PNEUMONIAE MODERATE GROWTH ENTEROCOCCUS GALLINARUM CRITICAL RESULT CALLED TO, READ BACK BY AND VERIFIED WITH: [facility] BORBA AT 1042 08/16/15 DV    Report Status 08/17/2015 FINAL  Final   Organism ID, Bacteria ESCHERICHIA COLI  Final   Organism ID, Bacteria PROTEUS MIRABILIS  Final   Organism ID, Bacteria KLEBSIELLA  PNEUMONIAE  Final   Organism ID, Bacteria ENTEROCOCCUS GALLINARUM  Final      Susceptibility   Escherichia coli - MIC*    AMPICILLIN >=32 RESISTANT Resistant     CEFTAZIDIME 4 RESISTANT Resistant     CEFAZOLIN >=64 RESISTANT Resistant     CEFTRIAXONE 16 RESISTANT Resistant     GENTAMICIN 2 SENSITIVE Sensitive     IMIPENEM >=16 RESISTANT Resistant     TRIMETH/SULFA <=20 SENSITIVE Sensitive     Extended ESBL POSITIVE Resistant     PIP/TAZO Value in next row Resistant      RESISTANT>=128    CIPROFLOXACIN Value in next row Sensitive      SENSITIVE<=0.25    * HEAVY GROWTH ESCHERICHIA COLI   Klebsiella pneumoniae - MIC*    AMPICILLIN Value in next row Resistant      SENSITIVE<=0.25    CEFTAZIDIME Value in next row Resistant      SENSITIVE<=0.25    CEFAZOLIN Value in next row Resistant      SENSITIVE<=0.25  CEFTRIAXONE Value in next row Resistant      SENSITIVE<=0.25    CIPROFLOXACIN Value in next row Resistant      SENSITIVE<=0.25    GENTAMICIN Value in next row Sensitive      SENSITIVE<=0.25    IMIPENEM Value in next row Resistant      SENSITIVE<=0.25    TRIMETH/SULFA Value in next row Resistant      SENSITIVE<=0.25    PIP/TAZO Value in next row Resistant      RESISTANT>=128    * LIGHT GROWTH KLEBSIELLA PNEUMONIAE   Proteus mirabilis - MIC*    AMPICILLIN Value in next row Resistant      RESISTANT>=128    CEFTAZIDIME Value in next row Sensitive      RESISTANT>=128    CEFAZOLIN Value in next row Sensitive      RESISTANT>=128    CEFTRIAXONE Value in next row Sensitive      RESISTANT>=128    CIPROFLOXACIN Value in next row Sensitive      RESISTANT>=128    GENTAMICIN Value in next row Sensitive      RESISTANT>=128    IMIPENEM Value in next row Sensitive      RESISTANT>=128    TRIMETH/SULFA Value in next row Sensitive      RESISTANT>=128    PIP/TAZO Value in next row Sensitive      SENSITIVE<=4    * MODERATE GROWTH PROTEUS MIRABILIS   Enterococcus gallinarum - MIC*     AMPICILLIN Value in next row Resistant      SENSITIVE<=4    GENTAMICIN SYNERGY Value in next row Sensitive      SENSITIVE<=4    CIPROFLOXACIN Value in next row Resistant      RESISTANT>=8    TETRACYCLINE Value in next row Resistant      RESISTANT>=16    * MODERATE GROWTH ENTEROCOCCUS GALLINARUM  Wound culture     Status: None   Collection Time: 08/15/15  2:53 PM  Result Value Ref Range Status   Specimen Description WOUND  Final   Special Requests NONE  Final   Gram Stain FEW WBC SEEN NO ORGANISMS SEEN   Final   Culture NO GROWTH 3 DAYS  Final   Report Status 08/18/2015 FINAL  Final  C difficile quick scan w PCR reflex     Status: None   Collection Time: 08/17/15 11:34 AM  Result Value Ref Range Status   C Diff antigen NEGATIVE NEGATIVE Final   C Diff toxin NEGATIVE NEGATIVE Final   C Diff interpretation Negative for C. difficile  Final    Coagulation Studies: No results for input(s): LABPROT, INR in the last 72 hours.  Urinalysis: No results for input(s): COLORURINE, LABSPEC, PHURINE, GLUCOSEU, HGBUR, BILIRUBINUR, KETONESUR, PROTEINUR, UROBILINOGEN, NITRITE, LEUKOCYTESUR in the last 72 hours.  Invalid input(s): APPERANCEUR    Imaging: No results found.   Medications:   . norepinephrine (LEVOPHED) Adult infusion Stopped (08/24/15 0755)  . pureflow 3 each (08/25/15 0826)   . acidophilus  1 capsule Oral Daily  . antiseptic oral rinse  7 mL Mouth Rinse q12n4p  . [START ON 08/29/2015] aspirin  324 mg Oral Daily  . budesonide (PULMICORT) nebulizer solution  0.5 mg Nebulization BID  . chlorhexidine  15 mL Mouth Rinse BID  . DAPTOmycin (CUBICIN)  IV  650 mg Intravenous Q48H  . epoetin (EPOGEN/PROCRIT) injection  20,000 Units Subcutaneous Weekly  . free water  100 mL Per Tube 3 times per day  . hydrocortisone sod succinate (  SOLU-CORTEF) inj  25 mg Intravenous Q8H  . insulin aspart  0-15 Units Subcutaneous 6 times per day  . ipratropium-albuterol  3 mL Nebulization BID   . lidocaine  1 patch Transdermal Q24H  . meropenem (MERREM) IV  1 g Intravenous Q12H  . midodrine  10 mg Oral TID WC  . multivitamin  5 mL Per Tube Daily  . nystatin   Topical TID  . pantoprazole (PROTONIX) IV  40 mg Intravenous Q12H  . potassium phosphate IVPB (mmol)  10 mmol Intravenous Once  . potassium phosphate IVPB (mmol)  15 mmol Intravenous Once  . sodium hypochlorite   Irrigation BID   sodium chloride, sodium chloride, alteplase, anticoagulant sodium citrate, lidocaine (PF), lidocaine-prilocaine, loperamide, morphine injection, pentafluoroprop-tetrafluoroeth  Assessment/ Plan:  49 y.o. 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  1. End-stage renal disease on hemodialysis on HD TTHS. The patient has been on dialysis since October of 2014. R IJ permcath.  Currently on CVVHD. However now hemodynamically stable. Plan on discontinuation of continuous dialysis and give patient a break.  - Plan on reassessing in 24 hours if patient can proceed with intermittent hemodialysis or will need to restart CRRT  2. Anemia of CKD: status post PRBC transfusion yesterday. Platelet count is worse - continue EPO 20000 weekly. Schedule to be given on10/8  3. Secondary hyperparathyroidism: PTH low at 55. -  phos 1.7: IV phos ordered.   - low due to poor PO intake, loss of muscle mass and CRRT clearance.   4. Septic shock: Multiple potential sources, - multiple organisms growing via  wound culture - appreciate ID recommendations: currently on meropenem and daptomycin.  - solucortef   LOS: 14 Ranelle Auker 10/7/20168:35 AM

## 2015-08-26 NOTE — Progress Notes (Signed)
ELECTROLYTE MANAGEMENT - FOLLOW UP   Pharmacy Consult for Electrolyte Management Indication: electrolyte management   Allergies  Allergen Reactions  . Contrast Media [Iodinated Diagnostic Agents] Anaphylaxis  . Ampicillin Rash    Patient Measurements: Height:  (175.3 cm) Weight: 196 lb 6.9 oz (89.1 kg) IBW/kg (Calculated) : 66.2   Vital Signs: Temp: 97.3 F (36.3 C) (10/07 0400) Temp Source: Axillary (10/07 0400) BP: 77/64 mmHg (10/07 0400) Pulse Rate: 92 (10/07 0400) Intake/Output from previous day: 10/06 0701 - 10/07 0700 In: 717 [Blood:337; IV Piggyback:200] Out: 1306 [Stool:200] Intake/Output from this shift:    Labs:  Recent Labs  08/24/15 0540  08/24/15 1648 08/25/15 0426 08/25/15 1616 08/26/15 0408  WBC  --   < > 8.2 7.0  --  10.2  HGB  --   < > 8.0* 6.8* 8.9* 9.2*  HCT  --   < > 23.7* 20.4* 26.7* 27.8*  PLT  --   < > 76* 76*  --  59*  APTT 43*  --   --  43*  --  41*  < > = values in this interval not displayed.   Recent Labs  08/24/15 1648 08/24/15 2211 08/25/15 0426  08/25/15 1608 08/25/15 2103 08/26/15 0408  NA 130* 139 141  < > 139 140 143  141  K 6.6* 4.3 4.0  < > 3.9 3.9 4.0  3.8  CL 99* 109 109  < > 107 110 109  109  CO2 < > GLUCOSE 214* 74 76  < > 82 75 79  79  BUN < > CREATININE 0.54 0.46 0.41*  < > 0.43* 0.41* 0.44  0.47  CALCIUM 7.4* 7.7* 7.9*  < > 8.1* 7.9* 8.2*  8.0*  MG 1.9 1.9 1.8  --   --   --   --   PHOS 8.6* 2.5 2.1*  2.1*  < > 1.9* 1.9* 1.7*  ALBUMIN 2.1* 1.9* 1.8*  < > 2.1* 2.1* 2.1*  < > = values in this interval not displayed. Estimated Creatinine Clearance: 100.1 mL/min (by C-G formula based on Cr of 0.44).    Recent Labs  08/25/15 2354 08/26/15 0116 08/26/15 0356  GLUCAP 69 85 79    Medical History: Past Medical History  Diagnosis Date  . Obesity   . Dyslipidemia   . Hypertension   . Coronary artery disease     s/p BMS 2010 LAD  .  Dysrhythmia     ventricular tachycardia resolved after LAD stent and beta blocker  . Diabetes mellitus     with retinopathy, neuropathy and microalbuminemia  . ESRD (end stage renal disease) on dialysis     Medications:  Scheduled:  . acidophilus  1 capsule Oral Daily  . antiseptic oral rinse  7 mL Mouth Rinse q12n4p  . [START ON 08/29/2015] aspirin  324 mg Oral Daily  . budesonide (PULMICORT) nebulizer solution  0.5 mg Nebulization BID  . chlorhexidine  15 mL Mouth Rinse BID  . DAPTOmycin (CUBICIN)  IV  650 mg Intravenous Q48H  . epoetin (EPOGEN/PROCRIT) injection  20,000 Units Subcutaneous Weekly  . free water  100 mL Per Tube 3 times per day  . hydrocortisone sod succinate (SOLU-CORTEF) inj  25 mg Intravenous Q8H  . insulin aspart  0-15 Units Subcutaneous 6 times per day  . ipratropium-albuterol  3 mL Nebulization BID  . lidocaine  1 patch Transdermal Q24H  . meropenem (MERREM) IV  1 g Intravenous Q12H  . midodrine  10 mg Oral TID WC  . multivitamin  5 mL Per Tube Daily  . nystatin   Topical TID  . pantoprazole (PROTONIX) IV  40 mg Intravenous Q12H  . potassium phosphate IVPB (mmol)  15 mmol Intravenous Once  . sodium hypochlorite   Irrigation BID    Assessment: 49 yo female admitted for sepsis, found to have hypokalemia. Pharmacy consulted for electrolyte management.  CCa: 9.7  Plan:  Phos remains low so will replace with KPhos 10 mmol x 1 and f/u AM labs.   Pharmacy will continue to monitor and adjust per consult.    Luisa Hart D, Pharm.D. Clinical Pharmacist 08/26/2015,7:02 AM

## 2015-08-26 NOTE — Clinical Social Work Note (Signed)
Patient remains on 30% o2 through trach and will need to be 28% prior to discharge. Patient has been on CRRT and nephrology is halting this today to see if patient's kidney function is acceptable. CSW continuing to follow and to update [employer]. York Spaniel MSW,LCSW [phone]

## 2015-08-26 NOTE — Progress Notes (Signed)
Nutrition Follow-up  DOCUMENTATION CODES:   Severe malnutrition in context of acute illness/injury but likely chronic in nature based on pt's history  INTERVENTION:   Coordination of Care: discussed nutritional poc with MD Nicholos Johns during ICU rounds this AM; pt continues without nutrition, TF on hold since Monday. MD Nicholos Johns deferring to GI MD for reinitiation of TF due to GI bleed; Britney RN paged MD Shelle Iron, MD deferring to ICU intensvisit. Discussed with MD Kasa this afternoon, per MD, plan to hold on nutrition intervention today and reassess tomorrow.    NUTRITION DIAGNOSIS:   Inadequate oral intake related to wound healing, acute illness, chronic illness as evidenced by NPO status, estimated needs. Continues  GOAL:   Patient will meet greater than or equal to 90% of their needs  MONITOR:    (Energy Intake, Anthropometrics, Digestive System, Electrolyte/Renal Profile, Glucose Profile)  REASON FOR ASSESSMENT:   Consult Enteral/tube feeding initiation and management  ASSESSMENT:   Pt remains on trach collar, does not participate on exam today although more alert per RN, off CRRT at present, possible trial of HD tomorrow; rectal tube has been removed, still some bleeding noted in stool but Hgb stable, pt with pressure ulcer in nare where dobhoff has been, dobhoff tube shifted  Diet Order:  Diet NPO time specified   Energy Intake: TF on hold since the evening of 10/3, pt with inadequate nutrition at least since last Friday 9/30 when TF rate turned down  Skin:   (stage IV ulcer on foot, unstageable on sacrum)  Last BM:  9/26 loose medium   Nutrition Focused Physical Exam: Nutrition-Focused physical exam completed. Findings are mild fat depletion, mild to severe muscle depletion, and mild to moderate edema. Difficult physical assessment as pt cannot participate in exam, difficult to visualize all areas  Electrolyte and Renal Profile:  Recent Labs Lab 08/24/15 1648  08/24/15 2211 08/25/15 0426  08/25/15 2103 08/26/15 0408 08/26/15 1144  BUN < > CREATININE 0.54 0.46 0.41*  < > 0.41* 0.44  0.47 0.47  NA 130* 139 141  < > 140 143  141 139  K 6.6* 4.3 4.0  < > 3.9 4.0  3.8 3.8  MG 1.9 1.9 1.8  --   --   --   --   PHOS 8.6* 2.5 2.1*  2.1*  < > 1.9* 1.7* 1.8*  < > = values in this interval not displayed. Glucose Profile:   Recent Labs  08/26/15 0356 08/26/15 0837 08/26/15 1152  GLUCAP 79 79 85   Nutritional Anemia Profile:  CBC Latest Ref Rng 08/26/2015 08/25/2015 08/25/2015  WBC 3.6 - 11.0 K/uL 10.2 - 7.0  Hemoglobin 12.0 - 16.0 g/dL 1.6(X) 8.9(L) 6.8(L)  Hematocrit 35.0 - 47.0 % 27.8(L) 26.7(L) 20.4(L)  Platelets 150 - 440 K/uL 59(L) - 76(L)    Meds: acidophilus, ss novolog, midodrine, potassium phosphate, immodium  Height:   Ht Readings from Last 1 Encounters:  07/31/2015  (1.753 m)    Weight:   Wt Readings from Last 1 Encounters:  08/26/15 182 lb 15.7 oz (83 kg)   Filed Weights   08/22/15 0500 08/25/15 0437 08/26/15 0700  Weight: 179 lb 0.2 oz (81.2 kg) 196 lb 6.9 oz (89.1 kg) 182 lb 15.7 oz (83 kg)    BMI:  Body mass index is 27.01 kg/(m^2).  Estimated Nutritional Needs:   Kcal:  0960-4540 kcals (BEE 1434, 1.2 AF, 1.2-1.4 IF)   Protein:  113-150 g/kg (1.5-2.0 g/kg) but may be closer to 150-188 g (2.0-2.5 g/kg)   Fluid:  1000 mL plus UOP  EDUCATION NEEDS:   No education needs identified at this time  HIGH Care Level  Romelle Starcher MS, RD, LDN [phone] Pager

## 2015-08-26 NOTE — Progress Notes (Signed)
Paged Dr. Wynelle Link regarding order for ultrafiltration rate of 75 vs note yesterday morning for UF rate to be 50. He stated pt is to be on UF of 50.

## 2015-08-26 NOTE — Progress Notes (Signed)
Per Dr. Wynelle Link at bedside to stop CRRT. He stated he will reevaluate tomorrow (10/8)  for the need to either restart CRRT or try the patient back on hemodialysis.

## 2015-08-26 NOTE — Progress Notes (Signed)
NEUROLOGY NOTE  S: No real changes today per nursing  ROS unobtainable to aphasia  O: 973    140/55    88   14 No distress, overweight Normocephalic, oropharynx clear Supple, no JVD CTA B, no wheezing tachycardic, no murmur Diffuse edema, no C/C  Trached, more somnolent today, tracks but does not follow Pupils 4mm and sluggish, weak R corneal, good L corneal, R droop Localizes on L, trace movement on R  A/P: 1. Multiple embolic acute infarcts- Etiology still appears to be cardioembolic to me. This will still cause permanent deficits of R hemiparesis and aphasia which will not likely get better; Pt has problems following still 2. Encephalopathy- slight regression today but that is the nature of this;  Likely related to infections and renal dysfunction - Will start neurostimulation once pt is stable from GI standpoint - Still needs TEE when stable but now pt cant be anticoagulated anyhow -  Will need hypercoaguable w/u if TEE is neg - Would start ASA as soon as possible from medical standpoint - Try to keep Hgb > 8 - Keep MAP > 60 for good perfusion - Antibiotics per ID but would cover for possible endocarditis - Will call husband via phone to update - Will still follow with you

## 2015-08-26 NOTE — Progress Notes (Signed)
Called and spoke with Dr. Shelle Iron with Gastrology. He stated it was okay to start back tube feedings.

## 2015-08-26 NOTE — Progress Notes (Signed)
Pharmacy Consult for Daptomycin/Meropenem Indication: Sepsis/Sacral decubitus  Allergies  Allergen Reactions  . Contrast Media [Iodinated Diagnostic Agents] Anaphylaxis  . Ampicillin Rash    Patient Measurements: Height:  (175.3 cm) Weight: 182 lb 15.7 oz (83 kg) IBW/kg (Calculated) : 66.2   Vital Signs: Temp: 97.3 F (36.3 C) (10/07 0400) Temp Source: Axillary (10/07 0400) BP: 140/55 mmHg (10/07 1200) Pulse Rate: 88 (10/07 1200) Intake/Output from previous day: 10/06 0701 - 10/07 0700 In: 717 [Blood:337; IV Piggyback:200] Out: 1353 [Stool:200] Intake/Output from this shift: Total I/O In: 253.3 [IV Piggyback:253.3] Out: 47 [Other:47]  Labs:  Recent Labs  08/24/15 1648  08/25/15 0426  08/25/15 1616 08/25/15 2103 08/26/15 0408 08/26/15 1144  WBC 8.2  --  7.0  --   --   --  10.2  --   HGB 8.0*  --  6.8*  --  8.9*  --  9.2*  --   PLT 76*  --  76*  --   --   --  59*  --   CREATININE 0.54  < > 0.41*  < >  --  0.41* 0.44  0.47 0.47  < > = values in this interval not displayed. Estimated Creatinine Clearance: 96.8 mL/min (by C-G formula based on Cr of 0.47). No results for input(s): VANCOTROUGH, VANCOPEAK, VANCORANDOM, GENTTROUGH, GENTPEAK, GENTRANDOM, TOBRATROUGH, TOBRAPEAK, TOBRARND, AMIKACINPEAK, AMIKACINTROU, AMIKACIN in the last 72 hours.   Microbiology: Recent Results (from the past 720 hour(s))  Culture, blood (routine x 2)     Status: None   Collection Time: 08/01/15  7:00 PM  Result Value Ref Range Status   Specimen Description BLOOD RIGHT ANTECUBITAL  Final   Special Requests BOTTLES DRAWN AEROBIC ONLY 5CC  Final   Culture NO GROWTH 5 DAYS  Final   Report Status 08/06/2015 FINAL  Final  Culture, blood (routine x 2)     Status: None   Collection Time: 08/01/15  7:10 PM  Result Value Ref Range Status   Specimen Description BLOOD RIGHT HAND  Final   Special Requests IN PEDIATRIC BOTTLE 3CC  Final   Culture  Setup Time   Final    GRAM POSITIVE COCCI  IN CLUSTERS AEROBIC BOTTLE ONLY CRITICAL RESULT CALLED TO, READ BACK BY AND VERIFIED WITH: D TAYLOR,RN AT 1115 08/02/15 BY L BENFIELD    Culture STAPHYLOCOCCUS SPECIES (COAGULASE NEGATIVE)  Final   Report Status 08/06/2015 FINAL  Final   Organism ID, Bacteria STAPHYLOCOCCUS SPECIES (COAGULASE NEGATIVE)  Final      Susceptibility   Staphylococcus species (coagulase negative) - MIC*    CIPROFLOXACIN >=8 RESISTANT Resistant     ERYTHROMYCIN <=0.25 SENSITIVE Sensitive     GENTAMICIN 8 INTERMEDIATE Intermediate     OXACILLIN >=4 RESISTANT Resistant     TETRACYCLINE <=1 SENSITIVE Sensitive     VANCOMYCIN 1 SENSITIVE Sensitive     TRIMETH/SULFA 160 RESISTANT Resistant     CLINDAMYCIN <=0.25 SENSITIVE Sensitive     RIFAMPIN <=0.5 SENSITIVE Sensitive     Inducible Clindamycin NEGATIVE Sensitive     * STAPHYLOCOCCUS SPECIES (COAGULASE NEGATIVE)  Culture, blood (routine x 2)     Status: None   Collection Time: 08/02/15  7:00 PM  Result Value Ref Range Status   Specimen Description BLOOD RIGHT ANTECUBITAL  Final   Special Requests BOTTLES DRAWN AEROBIC ONLY 10CC  Final   Culture  Setup Time   Final    GRAM POSITIVE COCCI IN CLUSTERS AEROBIC BOTTLE ONLY CRITICAL RESULT CALLED TO, READ  BACK BY AND VERIFIED WITH: DR Gwenevere Abbot RN 1549 08/03/15 A BROWNING    Culture   Final    STAPHYLOCOCCUS SPECIES (COAGULASE NEGATIVE) SUSCEPTIBILITIES PERFORMED ON PREVIOUS CULTURE WITHIN THE LAST 5 DAYS.    Report Status 08/06/2015 FINAL  Final  Culture, blood (routine x 2)     Status: None   Collection Time: 08/02/15  7:05 PM  Result Value Ref Range Status   Specimen Description BLOOD RIGHT HAND  Final   Special Requests BOTTLES DRAWN AEROBIC AND ANAEROBIC 10CC  Final   Culture NO GROWTH 5 DAYS  Final   Report Status 08/07/2015 FINAL  Final  Blood Culture (routine x 2)     Status: None   Collection Time: 08/07/2015  8:51 AM  Result Value Ref Range Status   Specimen Description BLOOD UNKO  Final   Special  Requests BOTTLES DRAWN AEROBIC AND ANAEROBIC  3CC  Final   Culture NO GROWTH 5 DAYS  Final   Report Status 08/17/2015 FINAL  Final  Blood Culture (routine x 2)     Status: None   Collection Time: 08/17/2015  9:20 AM  Result Value Ref Range Status   Specimen Description BLOOD LEFT ARM  Final   Special Requests BOTTLES DRAWN AEROBIC AND ANAEROBIC  1CC  Final   Culture NO GROWTH 5 DAYS  Final   Report Status 08/17/2015 FINAL  Final  Wound culture     Status: None   Collection Time: 07/23/2015  9:20 AM  Result Value Ref Range Status   Specimen Description DECUBITIS  Final   Special Requests Normal  Final   Gram Stain   Final    FEW WBC SEEN MANY GRAM NEGATIVE RODS RARE GRAM POSITIVE COCCI    Culture   Final    HEAVY GROWTH ESCHERICHIA COLI MODERATE GROWTH ENTEROBACTER AEROGENES PROTEUS MIRABILIS HEAVY GROWTH ENTEROCOCCUS SPECIES VRE HAVE INTRINSIC RESISTANCE TO MOST COMMONLY USED ANTIBIOTICS AND THE ABILITY TO ACQUIRE RESISTANCE TO MOST AVAILABLE ANTIBIOTICS.    Report Status 08/16/2015 FINAL  Final   Organism ID, Bacteria ESCHERICHIA COLI  Final   Organism ID, Bacteria ENTEROBACTER AEROGENES  Final   Organism ID, Bacteria PROTEUS MIRABILIS  Final   Organism ID, Bacteria ENTEROCOCCUS SPECIES  Final      Susceptibility   Enterobacter aerogenes - MIC*    CEFTAZIDIME <=1 SENSITIVE Sensitive     CEFAZOLIN >=64 RESISTANT Resistant     CEFTRIAXONE <=1 SENSITIVE Sensitive     CIPROFLOXACIN <=0.25 SENSITIVE Sensitive     GENTAMICIN <=1 SENSITIVE Sensitive     IMIPENEM 1 SENSITIVE Sensitive     TRIMETH/SULFA <=20 SENSITIVE Sensitive     * MODERATE GROWTH ENTEROBACTER AEROGENES   Escherichia coli - MIC*    AMPICILLIN <=2 SENSITIVE Sensitive     CEFTAZIDIME <=1 SENSITIVE Sensitive     CEFAZOLIN <=4 SENSITIVE Sensitive     CEFTRIAXONE <=1 SENSITIVE Sensitive     CIPROFLOXACIN <=0.25 SENSITIVE Sensitive     GENTAMICIN <=1 SENSITIVE Sensitive     IMIPENEM <=0.25 SENSITIVE Sensitive      TRIMETH/SULFA <=20 SENSITIVE Sensitive     * HEAVY GROWTH ESCHERICHIA COLI   Proteus mirabilis - MIC*    AMPICILLIN >=32 RESISTANT Resistant     CEFTAZIDIME <=1 SENSITIVE Sensitive     CEFAZOLIN 8 SENSITIVE Sensitive     CEFTRIAXONE <=1 SENSITIVE Sensitive     CIPROFLOXACIN <=0.25 SENSITIVE Sensitive     GENTAMICIN <=1 SENSITIVE Sensitive     IMIPENEM 1 SENSITIVE  Sensitive     TRIMETH/SULFA <=20 SENSITIVE Sensitive     * PROTEUS MIRABILIS   Enterococcus species - MIC*    AMPICILLIN >=32 RESISTANT Resistant     VANCOMYCIN >=32 RESISTANT Resistant     GENTAMICIN SYNERGY SENSITIVE Sensitive     TETRACYCLINE Value in next row Resistant      RESISTANT>=16    * HEAVY GROWTH ENTEROCOCCUS SPECIES  MRSA PCR Screening     Status: None   Collection Time: 08/13/2015  2:38 PM  Result Value Ref Range Status   MRSA by PCR NEGATIVE NEGATIVE Final    Comment:        The GeneXpert MRSA Assay (FDA approved for NASAL specimens only), is one component of a comprehensive MRSA colonization surveillance program. It is not intended to diagnose MRSA infection nor to guide or monitor treatment for MRSA infections.   Blood culture (single)     Status: None   Collection Time: 08-13-15  3:36 PM  Result Value Ref Range Status   Specimen Description BLOOD RIGHT ASSIST CONTROL  Final   Special Requests BOTTLES DRAWN AEROBIC AND ANAEROBIC  Final   Culture NO GROWTH 5 DAYS  Final   Report Status 08/17/2015 FINAL  Final  Wound culture     Status: None   Collection Time: 08/13/15 12:37 PM  Result Value Ref Range Status   Specimen Description WOUND  Final   Special Requests Normal  Final   Gram Stain   Final    FEW WBC SEEN TOO NUMEROUS TO COUNT GRAM NEGATIVE RODS FEW GRAM POSITIVE COCCI    Culture   Final    HEAVY GROWTH ESCHERICHIA COLI MODERATE GROWTH PROTEUS MIRABILIS LIGHT GROWTH KLEBSIELLA PNEUMONIAE MODERATE GROWTH ENTEROCOCCUS GALLINARUM CRITICAL RESULT CALLED TO, READ BACK BY AND  VERIFIED WITH: [facility] BORBA AT 1042 08/16/15 DV    Report Status 08/17/2015 FINAL  Final   Organism ID, Bacteria ESCHERICHIA COLI  Final   Organism ID, Bacteria PROTEUS MIRABILIS  Final   Organism ID, Bacteria KLEBSIELLA PNEUMONIAE  Final   Organism ID, Bacteria ENTEROCOCCUS GALLINARUM  Final      Susceptibility   Escherichia coli - MIC*    AMPICILLIN >=32 RESISTANT Resistant     CEFTAZIDIME 4 RESISTANT Resistant     CEFAZOLIN >=64 RESISTANT Resistant     CEFTRIAXONE 16 RESISTANT Resistant     GENTAMICIN 2 SENSITIVE Sensitive     IMIPENEM >=16 RESISTANT Resistant     TRIMETH/SULFA <=20 SENSITIVE Sensitive     Extended ESBL POSITIVE Resistant     PIP/TAZO Value in next row Resistant      RESISTANT>=128    CIPROFLOXACIN Value in next row Sensitive      SENSITIVE<=0.25    * HEAVY GROWTH ESCHERICHIA COLI   Klebsiella pneumoniae - MIC*    AMPICILLIN Value in next row Resistant      SENSITIVE<=0.25    CEFTAZIDIME Value in next row Resistant      SENSITIVE<=0.25    CEFAZOLIN Value in next row Resistant      SENSITIVE<=0.25    CEFTRIAXONE Value in next row Resistant      SENSITIVE<=0.25    CIPROFLOXACIN Value in next row Resistant      SENSITIVE<=0.25    GENTAMICIN Value in next row Sensitive      SENSITIVE<=0.25    IMIPENEM Value in next row Resistant      SENSITIVE<=0.25    TRIMETH/SULFA Value in next row Resistant  SENSITIVE<=0.25    PIP/TAZO Value in next row Resistant      RESISTANT>=128    * LIGHT GROWTH KLEBSIELLA PNEUMONIAE   Proteus mirabilis - MIC*    AMPICILLIN Value in next row Resistant      RESISTANT>=128    CEFTAZIDIME Value in next row Sensitive      RESISTANT>=128    CEFAZOLIN Value in next row Sensitive      RESISTANT>=128    CEFTRIAXONE Value in next row Sensitive      RESISTANT>=128    CIPROFLOXACIN Value in next row Sensitive      RESISTANT>=128    GENTAMICIN Value in next row Sensitive      RESISTANT>=128    IMIPENEM Value in next row Sensitive       RESISTANT>=128    TRIMETH/SULFA Value in next row Sensitive      RESISTANT>=128    PIP/TAZO Value in next row Sensitive      SENSITIVE<=4    * MODERATE GROWTH PROTEUS MIRABILIS   Enterococcus gallinarum - MIC*    AMPICILLIN Value in next row Resistant      SENSITIVE<=4    GENTAMICIN SYNERGY Value in next row Sensitive      SENSITIVE<=4    CIPROFLOXACIN Value in next row Resistant      RESISTANT>=8    TETRACYCLINE Value in next row Resistant      RESISTANT>=16    * MODERATE GROWTH ENTEROCOCCUS GALLINARUM  Wound culture     Status: None   Collection Time: 08/15/15  2:53 PM  Result Value Ref Range Status   Specimen Description WOUND  Final   Special Requests NONE  Final   Gram Stain FEW WBC SEEN NO ORGANISMS SEEN   Final   Culture NO GROWTH 3 DAYS  Final   Report Status 08/18/2015 FINAL  Final  C difficile quick scan w PCR reflex     Status: None   Collection Time: 08/17/15 11:34 AM  Result Value Ref Range Status   C Diff antigen NEGATIVE NEGATIVE Final   C Diff toxin NEGATIVE NEGATIVE Final   C Diff interpretation Negative for C. difficile  Final    Medical History: Past Medical History  Diagnosis Date  . Obesity   . Dyslipidemia   . Hypertension   . Coronary artery disease     s/p BMS 2010 LAD  . Dysrhythmia     ventricular tachycardia resolved after LAD stent and beta blocker  . Diabetes mellitus     with retinopathy, neuropathy and microalbuminemia  . ESRD (end stage renal disease) on dialysis     Medications:  Scheduled:  . acidophilus  1 capsule Oral Daily  . antiseptic oral rinse  7 mL Mouth Rinse q12n4p  . [START ON 08/29/2015] aspirin  324 mg Oral Daily  . budesonide (PULMICORT) nebulizer solution  0.5 mg Nebulization BID  . chlorhexidine  15 mL Mouth Rinse BID  . DAPTOmycin (CUBICIN)  IV  500 mg Intravenous Q48H  . epoetin (EPOGEN/PROCRIT) injection  20,000 Units Subcutaneous Weekly  . free water  100 mL Per Tube 3 times per day  . hydrocortisone  sod succinate (SOLU-CORTEF) inj  25 mg Intravenous Q8H  . insulin aspart  0-15 Units Subcutaneous 6 times per day  . ipratropium-albuterol  3 mL Nebulization BID  . lidocaine  1 patch Transdermal Q24H  . [START ON 08/27/2015] meropenem (MERREM) IV  500 mg Intravenous q1800  . midodrine  10 mg Oral TID WC  .  multivitamin  5 mL Per Tube Daily  . nystatin   Topical TID  . pantoprazole (PROTONIX) IV  40 mg Intravenous Q12H  . potassium phosphate IVPB (mmol)  10 mmol Intravenous Once  . potassium phosphate IVPB (mmol)  15 mmol Intravenous Once  . sodium hypochlorite   Irrigation BID   Infusions:  . norepinephrine (LEVOPHED) Adult infusion Stopped (08/24/15 0755)  . 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 Patient is on day 14 of antibiotics (day 10 of daptomycin and day 14 of meropenem).  Goal of Therapy:  Resolution of infection  Plan:   Patient is now off CRRT so will revert to dialysis dosing of daptomycin 6 mg/kg q 48 hours and meropenem 500 mg iv q 24 hours. Pharmacy will continue to monitor and adjust per consult.    Luisa Hart D 08/26/2015,2:15 PM

## 2015-08-26 NOTE — Progress Notes (Signed)
[facility] Critical Care Medicine Progess Note    ASSESSMENT/PLAN  Patient is status post Gastric bypass with sleeve gastrectomy March 2014 at [facility] Med complicated by SBO requiring lysis of adhesions with subsequent small bowel enterotomy resulting in shock and multiple cardiac arrests, chronic respiratory failure s/p tracheostomy, DVT/PE on coumadin, HTN, DM2, OSA, COPD, ESRD. Intra-abdominal abscesses s/p lap LOA and drainage 03/2013. Subsequently has experienced numerous infections and hospitalizations since that time. Most recently on this admission she is admitted for severe septic shock and acute kidney injury as well as chronically contaminated decubitus ulcer. Her course on this admission has been, complicated by ischemic stroke, likely cardioembolic, and GI bleeding suspected to be from a rectal ulcer complicated by presence of a fecal tube.    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 History of obstructive sleep apnea History of COPD P:  Supplemental O2 to maintain SpO2 90-96% If requires vent support, will need to change trach to cuffed Currently doing well with trach collar and has not required ventilator support. -Patient is noted to have copious secretions with concomitant drop in her oxygen saturation. This responds to suctioning.   CARDIOVASCULAR A:  -Initial septic shock on pressors, subsequently developed hemorrhagic shock. Currently weaned off of all pressors. -Chronic steroids - possible component of adrenal insufficiency,currently on steroids. -Nonsustained ventricular tachycardia; history of coronary artery disease, diastolic congestive heart failure, as well as monomorphic and polymorphic ventricular tachycardia on previous admissions, which had responded to amiodarone. -History of cardiac arrest 3. -Echocardiogram from 08/22/2015 results were reviewed; ejection fraction was  55-60%, pulmonary systolic pressure was 39 mmHg P:  MAP goal > 60 mmHg Taper stress dose steroids as permitted by BP - we'll continue to monitor.  RENAL A:  ESRD - on CRRT.  Hypokalemia, resolved P:  Monitor BMET intermittently Monitor I/Os Correct electrolytes as indicated Discussed with nephrology. Plan to change to hemodialysis from CRRT.  GASTROINTESTINAL A:  -Gastrointestinal bleeding , appears to be doing better. Her stool amount has decreased, with no frank blood and more clots. -Continue to monitor stool output, await GI input to see whether we can restart tube feeding. -Hx of C. Diff - PCR neg -Incontinent of stool with chronic contamination of sacral decubitus ulcer.  P:   -No anticoagulation, aspirin was discontinued due to GI bleeding, can consider restarting on Monday if hemoglobins are stable. -c.diff PCR>>neg -As we have not ruled out gastritis or peptic ulcer disease as a cause of the patient's Intestinal bleeding, we will continue IV Protonix with twice a day dosing. Can consider change to once daily dosing if no further episodes of bleeding.  HEMATOLOGIC A:  Acute on chronic anemia Thrombocytopenia, . Etiology unclear. Platelet counts have been improving P:  DVT px: SCDs Monitor CBC intermittently Transfuse per usual guidelines Check HIT panel 9/25 - negative -will give platelet transfusions as needed for platelet count less than 10; or was 20 with evidence of active bleeding.  INFECTIOUS A:  -Severe sepsis, resolved. Was secondary to infected sacral decubitus ulcer. -Sacral decubitus ulcer with abscess -decubitus ulcer with history of osteomyelitis. 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 P:  Monitor temp, WBC count Micro and abx as above   ENDOCRINE A:  DM 2, controlled Chronic  prednisone therapy - was on chronic dose 10 mg BID Secondary adrenal insuff P:  CBGs and SSI - mod scale -Stress dose steroids - wean as able based on BP; will decrease dose of hydrocortisone IV to 25 mg every 8 hours.  NEUROLOGIC A:  Acute encephalopathy, -somnolent still, suspect due to chronic sepsis with metabolic encephalopathy, and, complicated by development of CVA.     ADMISSION DATE: 2015/08/16 CONSULTATION DATE: 9/23  INITIAL PRESENTATION:  32 F who has been in medical facilities (hosp, LTAC, rehab) for 2 yrs following gastric bypass surgery with multiple complications. Now with chronic trach, ESRD, profound debilitation, severe sacral pressure ulcer. Was seemingly making progress and transferred to rehab facility approx one week prior to this admission. She was sent to [facility] ED with AMS and hypotension. Working dx of severe sepsis/septic shock due to infected sacral pressure ulcer. Also has R side externalized HD cath and tunneled L IJ CVL. She has history of resistant bacterial infections. She is on chronic steroids. Her husband indicates that she has been diagnosed with adrenal insuff at some point during the past 2 yrs  MAJOR EVENTS/TEST RESULTS:  Admission 02/07/14-05/07/14 Admission 07/21/14-09/06/14 Discharged to [facility]. Pt had palliative consult at that time, were asked to sign off by husband.  9/23 CT head: NAD 9/23 EEG: no epileptiform activity 9/23 PRBCs for Hgb 6.4 9/24 bedside debridement of sacral wound. Abscess drained 9/25 Off vasopressors. More alert. No distress. Worsening thrombocytopenia. Vanc DC'd 9/29  Dr. Sampson Goon (I.D) excused from the case by patient's husband. 10/2 MRI -multiple infarcts 10/3 tracheal bleeding- transfused platelets 10/3 hospitalist service excused from the case by patient's husband 08/22/2015 Echocardiogram ejection fraction was 55-60%, pulmonary systolic pressure was 39 mmHg  INDWELLING DEVICES:: Trach (chronic) placed June  2014 Tunneled R IJ HD cath (chronic) Tunneled L IJ CVL (chronic) L femoral A-line 9/23 >> 9/25  MICRO DATA: 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 History of sepsis from C. glabrata MRSA PCR 9/23 >> NEG Urine 9/23 >>  Wound (swab) 9/23 >> many GNR, rare GPC >>  Blood 9/23 >> neg  Wound (debridement) 9/24 >> heavy GNR >> CRE, Enterococcus, K. Pneumoniae, P. Mirabilis, VRE CDiff 9/27>>neg  ANTIMICROBIALS:  Aztreonam 9/23 >> 9/24 Vanc 9/23 >> 9/25 Vanc 9/26>>9/27 Meropenem 9/23 >>  Daptomycin 9/27>>   ---------------------------------------   ----------------------------------------   Name: Courtney [name] MRN: [id number] DOB: 1966-04-25    ADMISSION DATE:  08-16-15    CHIEF COMPLAINT:  dyspnea     SUBJECTIVE:   Pt currently nonverbal, can not provide history or review of systems. However, she appears to be awake and opens eyes spontaneously. she does not appear to move her right side spontaneously.  Review of Systems:  Pt currently nonverbal, can not provide history or review of systems.    VITAL SIGNS: Temp:  [97.3 F (36.3 C)-97.9 F (36.6 C)] 97.3 F (36.3 C) (10/07 0400) Pulse Rate:  [62-104] 88 (10/07 1200) Resp:  [2-21] 14 (10/07 1200) BP: (72-150)/(27-79) 140/55 mmHg (10/07 1200) SpO2:  [100 %] 100 % (10/07 1200) FiO2 (%):  [30 %] 30 % (10/07 1134) Weight:  [83 kg (182 lb 15.7 oz)] 83 kg (182 lb 15.7 oz) (10/07 0700) HEMODYNAMICS:   VENTILATOR SETTINGS: Vent Mode:  [-]  FiO2 (%):  [30 %] 30 % INTAKE / OUTPUT:  Intake/Output Summary (Last 24 hours) at 08/26/15 1424 Last data filed at 08/26/15 0843  Gross per 24 hour  Intake 660.33 ml  Output    864 ml  Net -203.67 ml  PHYSICAL EXAMINATION: Physical Examination:   VS: BP 140/55 mmHg  Pulse 88  Temp(Src) 97.3 F (36.3 C) (Axillary)  Resp 14  Ht 5\' 9"  (1.753 m)  Wt 83 kg (182 lb 15.7 oz)  BMI 27.01 kg/m2   SpO2 100%  General Appearance: No distress  Neuro:, mental status reduced, though better than yesterday. Right upper and lower extremity strength 0 out of 5 left upper and left lower extremity strength, 1 out of 5 HEENT: PERRLA, EOM intact. Pulmonary: normal breath sounds   CardiovascularNormal S1,S2.  No m/r/g.   Abdomen: Benign, Soft, non-tender. Renal:  No costovertebral tenderness  GU:  Not performed at this time. Endocrine: No evident thyromegaly. Skin:   warm, no rashes, no ecchymosis  Extremities: normal, no cyanosis, clubbing.   LABS:   LABORATORY PANEL:   CBC  Recent Labs Lab 08/26/15 0408  WBC 10.2  HGB 9.2*  HCT 27.8*  PLT 59*    Chemistries   Recent Labs Lab 08/22/15 0539  08/25/15 0426  08/26/15 1144  NA 139  < > 141  < > 139  K 4.2  < > 4.0  < > 3.8  CL 105  < > 109  < > 108  CO2 30  < > 29  < > 28  GLUCOSE 126*  < > 76  < > 75  BUN 26*  < > 13  < > 12  CREATININE 0.60  < > 0.41*  < > 0.47  CALCIUM 8.0*  < > 7.9*  < > 7.8*  MG 1.8  < > 1.8  --   --   PHOS 2.0*  < > 2.1*  2.1*  < > 1.8*  AST 22  --   --   --   --   ALT 19  --   --   --   --   ALKPHOS 454*  --   --   --   --   BILITOT 0.6  --   --   --   --   < > = values in this interval not displayed.   Recent Labs Lab 08/25/15 2131 08/25/15 2354 08/26/15 0116 08/26/15 0356 08/26/15 0837 08/26/15 1152  GLUCAP 70 69 85 79 79 85   No results for input(s): PHART, PCO2ART, PO2ART in the last 168 hours.  Recent Labs Lab 08/22/15 0539  08/25/15 2103 08/26/15 0408 08/26/15 1144  AST 22  --   --   --   --   ALT 19  --   --   --   --   ALKPHOS 454*  --   --   --   --   BILITOT 0.6  --   --   --   --   ALBUMIN 2.0*  < > 2.1* 2.1* 2.0*  < > = values in this interval not displayed.  Cardiac Enzymes No results for input(s): TROPONINI in the last 168 hours.  RADIOLOGY:  No results found.     --Wells Guiles, MD.   [facility]   Santiago Glad, M.D.    Stephanie Acre, M.D.  Billy Fischer, M.D  Critical Care Attestation.  I have personally obtained a history, examined the patient, evaluated laboratory and imaging results, formulated the assessment and plan and placed orders. I discussed and coordinated care of this patient with the patient's husband by telephone, patient's daughter at bedside, critical care nurse, ICU pharmacist, nutrition therapy, social worker, patient's nephrologist. The Patient requires high  complexity decision making for assessment and supp course ort, frequent evaluation and titration of therapies, application of advanced monitoring technologies and extensive interpretation of multiple databases. The patient has critical illness that could lead imminently to failure of 1 or more organ systems and requires the highest level of physician preparedness to intervene.  Critical Care Time devoted to patient care services described in this note is 35 minutes and is exclusive of time spent in procedures.

## 2015-08-27 DIAGNOSIS — R404 Transient alteration of awareness: Secondary | ICD-10-CM

## 2015-08-27 DIAGNOSIS — I639 Cerebral infarction, unspecified: Secondary | ICD-10-CM | POA: Insufficient documentation

## 2015-08-27 DIAGNOSIS — J9690 Respiratory failure, unspecified, unspecified whether with hypoxia or hypercapnia: Secondary | ICD-10-CM | POA: Insufficient documentation

## 2015-08-27 DIAGNOSIS — I638 Other cerebral infarction: Secondary | ICD-10-CM

## 2015-08-27 DIAGNOSIS — R0600 Dyspnea, unspecified: Secondary | ICD-10-CM

## 2015-08-27 DIAGNOSIS — R4189 Other symptoms and signs involving cognitive functions and awareness: Secondary | ICD-10-CM | POA: Insufficient documentation

## 2015-08-27 LAB — CBC
HEMATOCRIT: 26.5 % — AB (ref 35.0–47.0)
HEMOGLOBIN: 8.7 g/dL — AB (ref 12.0–16.0)
MCH: 29.3 pg (ref 26.0–34.0)
MCHC: 32.7 g/dL (ref 32.0–36.0)
MCV: 89.8 fL (ref 80.0–100.0)
Platelets: 62 10*3/uL — ABNORMAL LOW (ref 150–440)
RBC: 2.96 MIL/uL — AB (ref 3.80–5.20)
RDW: 17.2 % — ABNORMAL HIGH (ref 11.5–14.5)
WBC: 9.9 10*3/uL (ref 3.6–11.0)

## 2015-08-27 LAB — GLUCOSE, CAPILLARY
GLUCOSE-CAPILLARY: 85 mg/dL (ref 65–99)
GLUCOSE-CAPILLARY: 112 mg/dL — AB (ref 65–99)
Glucose-Capillary: 102 mg/dL — ABNORMAL HIGH (ref 65–99)
Glucose-Capillary: 117 mg/dL — ABNORMAL HIGH (ref 65–99)
Glucose-Capillary: 127 mg/dL — ABNORMAL HIGH (ref 65–99)
Glucose-Capillary: 85 mg/dL (ref 65–99)
Glucose-Capillary: 86 mg/dL (ref 65–99)

## 2015-08-27 LAB — APTT: aPTT: 32 seconds (ref 24–36)

## 2015-08-27 LAB — BASIC METABOLIC PANEL
Anion gap: 5 (ref 5–15)
BUN: 15 mg/dL (ref 6–20)
CALCIUM: 7.8 mg/dL — AB (ref 8.9–10.3)
CO2: 27 mmol/L (ref 22–32)
CREATININE: 0.65 mg/dL (ref 0.44–1.00)
Chloride: 108 mmol/L (ref 101–111)
GFR calc Af Amer: >60 mL/min (ref >60)
GLUCOSE: 88 mg/dL (ref 65–99)
Potassium: 3.9 mmol/L (ref 3.5–5.1)
Sodium: 140 mmol/L (ref 135–145)

## 2015-08-27 LAB — MAGNESIUM: MAGNESIUM: 1.6 mg/dL — AB (ref 1.7–2.4)

## 2015-08-27 LAB — PHOSPHORUS: Phosphorus: 2.7 mg/dL (ref 2.5–4.6)

## 2015-08-27 MED ORDER — DEXTROSE 50 % IV SOLN
INTRAVENOUS | Status: AC
  Filled 2015-08-27: qty 50

## 2015-08-27 MED ORDER — PRO-STAT SUGAR FREE PO LIQD
30.0000 mL | ORAL | Status: DC
  Administered 2015-08-27 – 2015-09-01 (×28): 30 mL via ORAL
  Administered 2015-09-01: 23:00:00 via ORAL
  Administered 2015-09-01 – 2015-09-07 (×32): 30 mL via ORAL
  Administered 2015-09-07: 15 mL via ORAL
  Administered 2015-09-07 – 2015-09-08 (×6): 30 mL via ORAL

## 2015-08-27 MED ORDER — JEVITY 1.5 CAL/FIBER PO LIQD
1000.0000 mL | ORAL | Status: DC
  Administered 2015-08-27: 1000 mL

## 2015-08-27 MED ORDER — DEXTROSE 50 % IV SOLN
50.0000 mL | Freq: Once | INTRAVENOUS | Status: AC
Start: 2015-08-27 — End: 2015-08-27
  Administered 2015-08-27: 50 mL via INTRAVENOUS

## 2015-08-27 MED ORDER — MAGNESIUM SULFATE 2 GM/50ML IV SOLN
2.0000 g | Freq: Once | INTRAVENOUS | Status: AC
  Administered 2015-08-27: 2 g via INTRAVENOUS
  Filled 2015-08-27: qty 50

## 2015-08-27 MED ORDER — AMANTADINE HCL 50 MG/5ML PO SYRP
100.0000 mg | ORAL_SOLUTION | Freq: Two times a day (BID) | ORAL | Status: DC
  Administered 2015-08-27: 100 mg via ORAL
  Filled 2015-08-27: qty 10

## 2015-08-27 MED ORDER — EPOETIN ALFA 10000 UNIT/ML IJ SOLN
10000.0000 [IU] | Freq: Once | INTRAMUSCULAR | Status: AC
  Administered 2015-08-28: 10000 [IU] via INTRAVENOUS

## 2015-08-27 NOTE — Progress Notes (Addendum)
Nutrition Follow-up  DOCUMENTATION CODES:   Severe malnutrition in context of acute illness/injury  INTERVENTION:   EN: discussed nutritional poc with MD Belia Heman; MD agreeable to restarting TF but requesting that RD speak with pt's husband, Jonny Ruiz prior to initiation regarding plan. Spoke with pt's husband; husband would like to try fiber-containing formula to see if this helps thicken stool. Discussed Jevity 1.5 as option,fiber-containing calorically dense formula, it also contains NutraFloraScFOS which are prebiotics to stimulate growth of beneficial bacteria in GI system, pt is also on a probiotic; husband agreeable with this. MD Kasa and pt husband requesting to restart TF at a lower rate; husband agreeable to starting at rate of 15 ml/hr and we will see how pt tolerates. Also discussed addition of Prostat protein modular, which pt was taking orally prior to admission and tolerating it well. Husband agreeable to adding Prostat as well at this time as he knows that protein intake for her is important. Recommend 6 packets per day to maxmize priotein intake (each packet contains 100 kcals, 15 g of protein; it is also sugar free). Husband would like for pt to receive 2000 kcals per day as goal. Goal rate of TF with Prostat would be 40 ml/hr; will provide 2040 kcals, 155 g of protein. Free water flush of 100 mL q 8 hours already ordered in computer, recommend continuing this free water flush at this point. No titration orders in computer at this time; will assess for titration on follow-up. Will follow stool output/consistency, Hgb, electrolytes, etc.   NUTRITION DIAGNOSIS:   Inadequate oral intake related to wound healing, acute illness, chronic illness as evidenced by NPO status, estimated needs. 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  Glucose Profile:  Recent Labs  08/27/15 0031 08/27/15 0340 08/27/15 0724  GLUCAP 117* 85 86   Nutritional Anemia Profile:  CBC Latest Ref Rng 08/27/2015 08/26/2015 08/25/2015  WBC 3.6 - 11.0 K/uL 9.9 10.2 -  Hemoglobin 12.0 - 16.0 g/dL 0.9(W) 1.1(B) 8.9(L)  Hematocrit 35.0 - 47.0 % 26.5(L) 27.8(L) 26.7(L)  Platelets 150 - 440 K/uL 62(L) 59(L) -    Meds: MVI, imodium, acidophilus  Height:   Ht Readings from Last 1 Encounters:  08/02/2015  (1.753 m)    Weight:   Wt  Readings from Last 1 Encounters:  08/27/15 185 lb 3 oz (84 kg)   Filed Weights   08/25/15 0437 08/26/15 0700 08/27/15 0416  Weight: 196 lb 6.9 oz (89.1 kg) 182 lb 15.7 oz (83 kg) 185 lb 3 oz (84 kg)    BMI:  Body mass index is 27.33 kg/(m^2).  Estimated Nutritional Needs:   Kcal:  2066-2409 kcals (BEE 1434, 1.2 AF, 1.2-1.4 IF)   Protein:  113-150 g/kg (1.5-2.0 g/kg) but may be closer to 150-188 g (2.0-2.5 g/kg)   Fluid:  1000 mL plus UOP  EDUCATION NEEDS:   No education needs identified at this time  HIGH Care Level  Romelle Starcher MS, RD, LDN [phone] Pager

## 2015-08-27 NOTE — Progress Notes (Signed)
Pharmacy Consult for Daptomycin/Meropenem Indication: Sepsis/Sacral decubitus  Allergies  Allergen Reactions  . Contrast Media [Iodinated Diagnostic Agents] Anaphylaxis  . Ampicillin Rash    Patient Measurements: Height: 5\' 9"  (175.3 cm) Weight: 185 lb 3 oz (84 kg) IBW/kg (Calculated) : 66.2   Vital Signs: Temp: 98.1 F (36.7 C) (10/08 0400) Temp Source: Oral (10/08 0015) BP: 138/60 mmHg (10/08 0600) Pulse Rate: 85 (10/08 0600) Intake/Output from previous day: 10/07 0701 - 10/08 0700 In: 823.3 [NG/GT:450; IV Piggyback:373.3] Out: 47  Intake/Output from this shift: Total I/O In: 80 [NG/GT:80] Out: -   Labs:  Recent Labs  08/25/15 0426  08/25/15 1616  08/26/15 0408 08/26/15 1144 08/27/15 0410  WBC 7.0  --   --   --  10.2  --  9.9  HGB 6.8*  --  8.9*  --  9.2*  --  8.7*  PLT 76*  --   --   --  59*  --  62*  CREATININE 0.41*  < >  --   < > 0.44  0.47 0.47 0.65  < > = values in this interval not displayed. Estimated Creatinine Clearance: 97.4 mL/min (by C-G formula based on Cr of 0.65). No results for input(s): VANCOTROUGH, VANCOPEAK, VANCORANDOM, GENTTROUGH, GENTPEAK, GENTRANDOM, TOBRATROUGH, TOBRAPEAK, TOBRARND, AMIKACINPEAK, AMIKACINTROU, AMIKACIN in the last 72 hours.   Microbiology: Recent Results (from the past 720 hour(s))  Culture, blood (routine x 2)     Status: None   Collection Time: 08/01/15  7:00 PM  Result Value Ref Range Status   Specimen Description BLOOD RIGHT ANTECUBITAL  Final   Special Requests BOTTLES DRAWN AEROBIC ONLY 5CC  Final   Culture NO GROWTH 5 DAYS  Final   Report Status 08/06/2015 FINAL  Final  Culture, blood (routine x 2)     Status: None   Collection Time: 08/01/15  7:10 PM  Result Value Ref Range Status   Specimen Description BLOOD RIGHT HAND  Final   Special Requests IN PEDIATRIC BOTTLE 3CC  Final   Culture  Setup Time   Final    GRAM POSITIVE COCCI IN CLUSTERS AEROBIC BOTTLE ONLY CRITICAL RESULT CALLED TO, READ BACK BY  AND VERIFIED WITH: D TAYLOR,RN AT 1115 08/02/15 BY L BENFIELD    Culture STAPHYLOCOCCUS SPECIES (COAGULASE NEGATIVE)  Final   Report Status 08/06/2015 FINAL  Final   Organism ID, Bacteria STAPHYLOCOCCUS SPECIES (COAGULASE NEGATIVE)  Final      Susceptibility   Staphylococcus species (coagulase negative) - MIC*    CIPROFLOXACIN >=8 RESISTANT Resistant     ERYTHROMYCIN <=0.25 SENSITIVE Sensitive     GENTAMICIN 8 INTERMEDIATE Intermediate     OXACILLIN >=4 RESISTANT Resistant     TETRACYCLINE <=1 SENSITIVE Sensitive     VANCOMYCIN 1 SENSITIVE Sensitive     TRIMETH/SULFA 160 RESISTANT Resistant     CLINDAMYCIN <=0.25 SENSITIVE Sensitive     RIFAMPIN <=0.5 SENSITIVE Sensitive     Inducible Clindamycin NEGATIVE Sensitive     * STAPHYLOCOCCUS SPECIES (COAGULASE NEGATIVE)  Culture, blood (routine x 2)     Status: None   Collection Time: 08/02/15  7:00 PM  Result Value Ref Range Status   Specimen Description BLOOD RIGHT ANTECUBITAL  Final   Special Requests BOTTLES DRAWN AEROBIC ONLY 10CC  Final   Culture  Setup Time   Final    GRAM POSITIVE COCCI IN CLUSTERS AEROBIC BOTTLE ONLY CRITICAL RESULT CALLED TO, READ BACK BY AND VERIFIED WITH: DR Gwenevere Abbot RN 1549 08/03/15 A BROWNING  Culture   Final    STAPHYLOCOCCUS SPECIES (COAGULASE NEGATIVE) SUSCEPTIBILITIES PERFORMED ON PREVIOUS CULTURE WITHIN THE LAST 5 DAYS.    Report Status 08/06/2015 FINAL  Final  Culture, blood (routine x 2)     Status: None   Collection Time: 08/02/15  7:05 PM  Result Value Ref Range Status   Specimen Description BLOOD RIGHT HAND  Final   Special Requests BOTTLES DRAWN AEROBIC AND ANAEROBIC 10CC  Final   Culture NO GROWTH 5 DAYS  Final   Report Status 08/07/2015 FINAL  Final  Blood Culture (routine x 2)     Status: None   Collection Time: 2015/08/24  8:51 AM  Result Value Ref Range Status   Specimen Description BLOOD UNKO  Final   Special Requests BOTTLES DRAWN AEROBIC AND ANAEROBIC  3CC  Final   Culture NO GROWTH  5 DAYS  Final   Report Status 08/17/2015 FINAL  Final  Blood Culture (routine x 2)     Status: None   Collection Time: 08/24/2015  9:20 AM  Result Value Ref Range Status   Specimen Description BLOOD LEFT ARM  Final   Special Requests BOTTLES DRAWN AEROBIC AND ANAEROBIC  1CC  Final   Culture NO GROWTH 5 DAYS  Final   Report Status 08/17/2015 FINAL  Final  Wound culture     Status: None   Collection Time: 08-24-2015  9:20 AM  Result Value Ref Range Status   Specimen Description DECUBITIS  Final   Special Requests Normal  Final   Gram Stain   Final    FEW WBC SEEN MANY GRAM NEGATIVE RODS RARE GRAM POSITIVE COCCI    Culture   Final    HEAVY GROWTH ESCHERICHIA COLI MODERATE GROWTH ENTEROBACTER AEROGENES PROTEUS MIRABILIS HEAVY GROWTH ENTEROCOCCUS SPECIES VRE HAVE INTRINSIC RESISTANCE TO MOST COMMONLY USED ANTIBIOTICS AND THE ABILITY TO ACQUIRE RESISTANCE TO MOST AVAILABLE ANTIBIOTICS.    Report Status 08/16/2015 FINAL  Final   Organism ID, Bacteria ESCHERICHIA COLI  Final   Organism ID, Bacteria ENTEROBACTER AEROGENES  Final   Organism ID, Bacteria PROTEUS MIRABILIS  Final   Organism ID, Bacteria ENTEROCOCCUS SPECIES  Final      Susceptibility   Enterobacter aerogenes - MIC*    CEFTAZIDIME <=1 SENSITIVE Sensitive     CEFAZOLIN >=64 RESISTANT Resistant     CEFTRIAXONE <=1 SENSITIVE Sensitive     CIPROFLOXACIN <=0.25 SENSITIVE Sensitive     GENTAMICIN <=1 SENSITIVE Sensitive     IMIPENEM 1 SENSITIVE Sensitive     TRIMETH/SULFA <=20 SENSITIVE Sensitive     * MODERATE GROWTH ENTEROBACTER AEROGENES   Escherichia coli - MIC*    AMPICILLIN <=2 SENSITIVE Sensitive     CEFTAZIDIME <=1 SENSITIVE Sensitive     CEFAZOLIN <=4 SENSITIVE Sensitive     CEFTRIAXONE <=1 SENSITIVE Sensitive     CIPROFLOXACIN <=0.25 SENSITIVE Sensitive     GENTAMICIN <=1 SENSITIVE Sensitive     IMIPENEM <=0.25 SENSITIVE Sensitive     TRIMETH/SULFA <=20 SENSITIVE Sensitive     * HEAVY GROWTH ESCHERICHIA COLI    Proteus mirabilis - MIC*    AMPICILLIN >=32 RESISTANT Resistant     CEFTAZIDIME <=1 SENSITIVE Sensitive     CEFAZOLIN 8 SENSITIVE Sensitive     CEFTRIAXONE <=1 SENSITIVE Sensitive     CIPROFLOXACIN <=0.25 SENSITIVE Sensitive     GENTAMICIN <=1 SENSITIVE Sensitive     IMIPENEM 1 SENSITIVE Sensitive     TRIMETH/SULFA <=20 SENSITIVE Sensitive     * PROTEUS  MIRABILIS   Enterococcus species - MIC*    AMPICILLIN >=32 RESISTANT Resistant     VANCOMYCIN >=32 RESISTANT Resistant     GENTAMICIN SYNERGY SENSITIVE Sensitive     TETRACYCLINE Value in next row Resistant      RESISTANT>=16    * HEAVY GROWTH ENTEROCOCCUS SPECIES  MRSA PCR Screening     Status: None   Collection Time: 08/01/2015  2:38 PM  Result Value Ref Range Status   MRSA by PCR NEGATIVE NEGATIVE Final    Comment:        The GeneXpert MRSA Assay (FDA approved for NASAL specimens only), is one component of a comprehensive MRSA colonization surveillance program. It is not intended to diagnose MRSA infection nor to guide or monitor treatment for MRSA infections.   Blood culture (single)     Status: None   Collection Time: 07/25/2015  3:36 PM  Result Value Ref Range Status   Specimen Description BLOOD RIGHT ASSIST CONTROL  Final   Special Requests BOTTLES DRAWN AEROBIC AND ANAEROBIC  Final   Culture NO GROWTH 5 DAYS  Final   Report Status 08/17/2015 FINAL  Final  Wound culture     Status: None   Collection Time: 08/13/15 12:37 PM  Result Value Ref Range Status   Specimen Description WOUND  Final   Special Requests Normal  Final   Gram Stain   Final    FEW WBC SEEN TOO NUMEROUS TO COUNT GRAM NEGATIVE RODS FEW GRAM POSITIVE COCCI    Culture   Final    HEAVY GROWTH ESCHERICHIA COLI MODERATE GROWTH PROTEUS MIRABILIS LIGHT GROWTH KLEBSIELLA PNEUMONIAE MODERATE GROWTH ENTEROCOCCUS GALLINARUM CRITICAL RESULT CALLED TO, READ BACK BY AND VERIFIED WITH: [facility] BORBA AT 1042 08/16/15 DV    Report Status 08/17/2015 FINAL   Final   Organism ID, Bacteria ESCHERICHIA COLI  Final   Organism ID, Bacteria PROTEUS MIRABILIS  Final   Organism ID, Bacteria KLEBSIELLA PNEUMONIAE  Final   Organism ID, Bacteria ENTEROCOCCUS GALLINARUM  Final      Susceptibility   Escherichia coli - MIC*    AMPICILLIN >=32 RESISTANT Resistant     CEFTAZIDIME 4 RESISTANT Resistant     CEFAZOLIN >=64 RESISTANT Resistant     CEFTRIAXONE 16 RESISTANT Resistant     GENTAMICIN 2 SENSITIVE Sensitive     IMIPENEM >=16 RESISTANT Resistant     TRIMETH/SULFA <=20 SENSITIVE Sensitive     Extended ESBL POSITIVE Resistant     PIP/TAZO Value in next row Resistant      RESISTANT>=128    CIPROFLOXACIN Value in next row Sensitive      SENSITIVE<=0.25    * HEAVY GROWTH ESCHERICHIA COLI   Klebsiella pneumoniae - MIC*    AMPICILLIN Value in next row Resistant      SENSITIVE<=0.25    CEFTAZIDIME Value in next row Resistant      SENSITIVE<=0.25    CEFAZOLIN Value in next row Resistant      SENSITIVE<=0.25    CEFTRIAXONE Value in next row Resistant      SENSITIVE<=0.25    CIPROFLOXACIN Value in next row Resistant      SENSITIVE<=0.25    GENTAMICIN Value in next row Sensitive      SENSITIVE<=0.25    IMIPENEM Value in next row Resistant      SENSITIVE<=0.25    TRIMETH/SULFA Value in next row Resistant      SENSITIVE<=0.25    PIP/TAZO Value in next row Resistant  RESISTANT>=128    * LIGHT GROWTH KLEBSIELLA PNEUMONIAE   Proteus mirabilis - MIC*    AMPICILLIN Value in next row Resistant      RESISTANT>=128    CEFTAZIDIME Value in next row Sensitive      RESISTANT>=128    CEFAZOLIN Value in next row Sensitive      RESISTANT>=128    CEFTRIAXONE Value in next row Sensitive      RESISTANT>=128    CIPROFLOXACIN Value in next row Sensitive      RESISTANT>=128    GENTAMICIN Value in next row Sensitive      RESISTANT>=128    IMIPENEM Value in next row Sensitive      RESISTANT>=128    TRIMETH/SULFA Value in next row Sensitive       RESISTANT>=128    PIP/TAZO Value in next row Sensitive      SENSITIVE<=4    * MODERATE GROWTH PROTEUS MIRABILIS   Enterococcus gallinarum - MIC*    AMPICILLIN Value in next row Resistant      SENSITIVE<=4    GENTAMICIN SYNERGY Value in next row Sensitive      SENSITIVE<=4    CIPROFLOXACIN Value in next row Resistant      RESISTANT>=8    TETRACYCLINE Value in next row Resistant      RESISTANT>=16    * MODERATE GROWTH ENTEROCOCCUS GALLINARUM  Wound culture     Status: None   Collection Time: 08/15/15  2:53 PM  Result Value Ref Range Status   Specimen Description WOUND  Final   Special Requests NONE  Final   Gram Stain FEW WBC SEEN NO ORGANISMS SEEN   Final   Culture NO GROWTH 3 DAYS  Final   Report Status 08/18/2015 FINAL  Final  C difficile quick scan w PCR reflex     Status: None   Collection Time: 08/17/15 11:34 AM  Result Value Ref Range Status   C Diff antigen NEGATIVE NEGATIVE Final   C Diff toxin NEGATIVE NEGATIVE Final   C Diff interpretation Negative for C. difficile  Final    Medical History: Past Medical History  Diagnosis Date  . Obesity   . Dyslipidemia   . Hypertension   . Coronary artery disease     s/p BMS 2010 LAD  . Dysrhythmia     ventricular tachycardia resolved after LAD stent and beta blocker  . Diabetes mellitus     with retinopathy, neuropathy and microalbuminemia  . ESRD (end stage renal disease) on dialysis     Medications:  Scheduled:  . acidophilus  1 capsule Oral Daily  . antiseptic oral rinse  7 mL Mouth Rinse q12n4p  . [START ON 08/29/2015] aspirin  324 mg Oral Daily  . budesonide (PULMICORT) nebulizer solution  0.5 mg Nebulization BID  . chlorhexidine  15 mL Mouth Rinse BID  . DAPTOmycin (CUBICIN)  IV  500 mg Intravenous Q48H  . feeding supplement (PRO-STAT SUGAR FREE 64)  30 mL Oral 6 times per day  . free water  100 mL Per Tube 3 times per day  . hydrocortisone sod succinate (SOLU-CORTEF) inj  25 mg Intravenous Q8H  . insulin  aspart  0-15 Units Subcutaneous 6 times per day  . ipratropium-albuterol  3 mL Nebulization BID  . lidocaine  1 patch Transdermal Q24H  . meropenem (MERREM) IV  500 mg Intravenous q1800  . midodrine  10 mg Oral TID WC  . multivitamin  5 mL Per Tube Daily  . nystatin  Topical TID  . pantoprazole (PROTONIX) IV  40 mg Intravenous Q12H  . potassium phosphate IVPB (mmol)  15 mmol Intravenous Once  . sodium hypochlorite   Irrigation BID   Infusions:  . feeding supplement (JEVITY 1.5 CAL/FIBER)    . norepinephrine (LEVOPHED) Adult infusion Stopped (08/24/15 0755)  . 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 Patient is on day 15 of antibiotics (day 11 of daptomycin and day 15 of meropenem).  Goal of Therapy:  Resolution of infection  Plan:   Patient is now off CRRT so will revert to dialysis dosing of daptomycin 6 mg/kg q 48 hours and meropenem 500 mg iv q 24 hours. Will need to f/u and make sure patient tolerates HD. Pharmacy will continue to monitor and adjust per consult.    Luisa Hart D 08/27/2015,11:22 AM

## 2015-08-27 NOTE — Progress Notes (Signed)
ptient with #6 shiley cuffless trach. Trach intact. bbs noted. Able to suction trach for small amount of white secretions. Patient tolerated well. Vitals stable.

## 2015-08-27 NOTE — Progress Notes (Signed)
Subjective:   Taken off CRRT. Hemodynamics remain stable.  Plan to start tube feeds  Plan for hemodialysis later today.    Objective:  Vital signs in last 24 hours:  Temp:  [98.1 F (36.7 C)-98.2 F (36.8 C)] 98.1 F (36.7 C) (10/08 0400) Pulse Rate:  [77-110] 85 (10/08 0600) Resp:  [0-26] 18 (10/08 0600) BP: (105-155)/(50-114) 138/60 mmHg (10/08 0600) SpO2:  [100 %] 100 % (10/08 0600) FiO2 (%):  [30 %] 30 % (10/07 1534) Weight:  [84 kg (185 lb 3 oz)] 84 kg (185 lb 3 oz) (10/08 0416)  Weight change: 1 kg (2 lb 3.3 oz) Filed Weights   08/25/15 0437 08/26/15 0700 08/27/15 0416  Weight: 89.1 kg (196 lb 6.9 oz) 83 kg (182 lb 15.7 oz) 84 kg (185 lb 3 oz)    Intake/Output: I/O last 3 completed shifts: In: 923.3 [NG/GT:450; IV Piggyback:473.3] Out: 626 [Other:626]   Intake/Output this shift:     Physical Exam: General: Critically ill  Head: Normocephalic, atraumatic. +NGT   Eyes: Eyes open, sluggish to light.   Neck: Trach in place, 100% O2, left IJ catheter  Lungs:  Scattered rhonchi, normal effort  Heart: regular  Abdomen:  Soft, nontender, BS present  Extremities:  +right upper extremity edema, trace edema of left upper extremity and dependent  Neurologic: Lethargic, opens eyes to verbal commands  Skin: +sacral decub in dressings  Access: R IJ permcath    Basic Metabolic Panel:  Recent Labs Lab 08/24/15 0540 08/24/15 1648 08/24/15 2211 08/25/15 0426  08/25/15 1608 08/25/15 2103 08/26/15 0408 08/26/15 1144 08/27/15 0410  NA 139  138 130* 139 141  < > 139 140 143  141 139 140  K 4.3  4.2 6.6* 4.3 4.0  < > 3.9 3.9 4.0  3.8 3.8 3.9  CL 107  107 99* 109 109  < > 107 110 109  109 108 108  CO2 < > GLUCOSE 74  76 214* 74 76  < > 82 75 79  79 75 88  BUN 21*  21* < > CREATININE 0.43*  0.48 0.54 0.46 0.41*  < > 0.43* 0.41* 0.44  0.47 0.47 0.65  CALCIUM 8.1*  7.8* 7.4* 7.7* 7.9*  <  > 8.1* 7.9* 8.2*  8.0* 7.8* 7.8*  MG 2.0 1.9 1.9 1.8  --   --   --   --   --  1.6*  PHOS 1.7*  1.8* 8.6* 2.5 2.1*  2.1*  < > 1.9* 1.9* 1.7* 1.8* 2.7  < > = values in this interval not displayed.  Liver Function Tests:  Recent Labs Lab 08/22/15 0539  08/25/15 1019 08/25/15 1608 08/25/15 2103 08/26/15 0408 08/26/15 1144  AST 22  --   --   --   --   --   --   ALT 19  --   --   --   --   --   --   ALKPHOS 454*  --   --   --   --   --   --   BILITOT 0.6  --   --   --   --   --   --   PROT 4.6*  --   --   --   --   --   --   ALBUMIN 2.0*  < >  2.0* 2.1* 2.1* 2.1* 2.0*  < > = values in this interval not displayed. No results for input(s): LIPASE, AMYLASE in the last 168 hours. No results for input(s): AMMONIA in the last 168 hours.  CBC:  Recent Labs Lab 08/21/15 0417  08/24/15 0543 08/24/15 1648 08/25/15 0426 08/25/15 1616 08/26/15 0408 08/27/15 0410  WBC 11.0  < > 9.4 8.2 7.0  --  10.2 9.9  NEUTROABS 9.2*  --   --  7.0*  --   --   --   --   HGB 8.4*  < > 8.3* 8.0* 6.8* 8.9* 9.2* 8.7*  HCT 25.9*  < > 24.6* 23.7* 20.4* 26.7* 27.8* 26.5*  MCV 94.2  < > 85.0 86.1 86.4  --  87.8 89.8  PLT 60*  < > 87* 76* 76*  --  59* 62*  < > = values in this interval not displayed.  Cardiac Enzymes: No results for input(s): CKTOTAL, CKMB, CKMBINDEX, TROPONINI in the last 168 hours.  BNP: Invalid input(s): POCBNP  CBG:  Recent Labs Lab 08/26/15 1957 08/26/15 2357 08/27/15 0031 08/27/15 0340 08/27/15 0724  GLUCAP 74 67 117* 85 86    Microbiology: Results for orders placed or performed during the hospital encounter of 08/09/2015  Blood Culture (routine x 2)     Status: None   Collection Time: 08/11/2015  8:51 AM  Result Value Ref Range Status   Specimen Description BLOOD [name]  Final   Special Requests BOTTLES DRAWN AEROBIC AND ANAEROBIC  3CC  Final   Culture NO GROWTH 5 DAYS  Final   Report Status 08/17/2015 FINAL  Final  Blood Culture (routine x 2)     Status: None    Collection Time: 07/24/2015  9:20 AM  Result Value Ref Range Status   Specimen Description BLOOD LEFT ARM  Final   Special Requests BOTTLES DRAWN AEROBIC AND ANAEROBIC  1CC  Final   Culture NO GROWTH 5 DAYS  Final   Report Status 08/17/2015 FINAL  Final  Wound culture     Status: None   Collection Time: 08/05/2015  9:20 AM  Result Value Ref Range Status   Specimen Description DECUBITIS  Final   Special Requests Normal  Final   Gram Stain   Final    FEW WBC SEEN MANY GRAM NEGATIVE RODS RARE GRAM POSITIVE COCCI    Culture   Final    HEAVY GROWTH ESCHERICHIA COLI MODERATE GROWTH ENTEROBACTER AEROGENES PROTEUS MIRABILIS HEAVY GROWTH ENTEROCOCCUS SPECIES VRE HAVE INTRINSIC RESISTANCE TO MOST COMMONLY USED ANTIBIOTICS AND THE ABILITY TO ACQUIRE RESISTANCE TO MOST AVAILABLE ANTIBIOTICS.    Report Status 08/16/2015 FINAL  Final   Organism ID, Bacteria ESCHERICHIA COLI  Final   Organism ID, Bacteria ENTEROBACTER AEROGENES  Final   Organism ID, Bacteria PROTEUS MIRABILIS  Final   Organism ID, Bacteria ENTEROCOCCUS SPECIES  Final      Susceptibility   Enterobacter aerogenes - MIC*    CEFTAZIDIME <=1 SENSITIVE Sensitive     CEFAZOLIN >=64 RESISTANT Resistant     CEFTRIAXONE <=1 SENSITIVE Sensitive     CIPROFLOXACIN <=0.25 SENSITIVE Sensitive     GENTAMICIN <=1 SENSITIVE Sensitive     IMIPENEM 1 SENSITIVE Sensitive     TRIMETH/SULFA <=20 SENSITIVE Sensitive     * MODERATE GROWTH ENTEROBACTER AEROGENES   Escherichia coli - MIC*    AMPICILLIN <=2 SENSITIVE Sensitive     CEFTAZIDIME <=1 SENSITIVE Sensitive     CEFAZOLIN <=4 SENSITIVE Sensitive  CEFTRIAXONE <=1 SENSITIVE Sensitive     CIPROFLOXACIN <=0.25 SENSITIVE Sensitive     GENTAMICIN <=1 SENSITIVE Sensitive     IMIPENEM <=0.25 SENSITIVE Sensitive     TRIMETH/SULFA <=20 SENSITIVE Sensitive     * HEAVY GROWTH ESCHERICHIA COLI   Proteus mirabilis - MIC*    AMPICILLIN >=32 RESISTANT Resistant     CEFTAZIDIME <=1 SENSITIVE Sensitive      CEFAZOLIN 8 SENSITIVE Sensitive     CEFTRIAXONE <=1 SENSITIVE Sensitive     CIPROFLOXACIN <=0.25 SENSITIVE Sensitive     GENTAMICIN <=1 SENSITIVE Sensitive     IMIPENEM 1 SENSITIVE Sensitive     TRIMETH/SULFA <=20 SENSITIVE Sensitive     * PROTEUS MIRABILIS   Enterococcus species - MIC*    AMPICILLIN >=32 RESISTANT Resistant     VANCOMYCIN >=32 RESISTANT Resistant     GENTAMICIN SYNERGY SENSITIVE Sensitive     TETRACYCLINE Value in next row Resistant      RESISTANT>=16    * HEAVY GROWTH ENTEROCOCCUS SPECIES  MRSA PCR Screening     Status: None   Collection Time: 08-14-2015  2:38 PM  Result Value Ref Range Status   MRSA by PCR NEGATIVE NEGATIVE Final    Comment:        The GeneXpert MRSA Assay (FDA approved for NASAL specimens only), is one component of a comprehensive MRSA colonization surveillance program. It is not intended to diagnose MRSA infection nor to guide or monitor treatment for MRSA infections.   Blood culture (single)     Status: None   Collection Time: 08/14/15  3:36 PM  Result Value Ref Range Status   Specimen Description BLOOD RIGHT ASSIST CONTROL  Final   Special Requests BOTTLES DRAWN AEROBIC AND ANAEROBIC  Final   Culture NO GROWTH 5 DAYS  Final   Report Status 08/17/2015 FINAL  Final  Wound culture     Status: None   Collection Time: 08/13/15 12:37 PM  Result Value Ref Range Status   Specimen Description WOUND  Final   Special Requests Normal  Final   Gram Stain   Final    FEW WBC SEEN TOO NUMEROUS TO COUNT GRAM NEGATIVE RODS FEW GRAM POSITIVE COCCI    Culture   Final    HEAVY GROWTH ESCHERICHIA COLI MODERATE GROWTH PROTEUS MIRABILIS LIGHT GROWTH KLEBSIELLA PNEUMONIAE MODERATE GROWTH ENTEROCOCCUS GALLINARUM CRITICAL RESULT CALLED TO, READ BACK BY AND VERIFIED WITH: [facility] BORBA AT 1042 08/16/15 DV    Report Status 08/17/2015 FINAL  Final   Organism ID, Bacteria ESCHERICHIA COLI  Final   Organism ID, Bacteria PROTEUS MIRABILIS  Final    Organism ID, Bacteria KLEBSIELLA PNEUMONIAE  Final   Organism ID, Bacteria ENTEROCOCCUS GALLINARUM  Final      Susceptibility   Escherichia coli - MIC*    AMPICILLIN >=32 RESISTANT Resistant     CEFTAZIDIME 4 RESISTANT Resistant     CEFAZOLIN >=64 RESISTANT Resistant     CEFTRIAXONE 16 RESISTANT Resistant     GENTAMICIN 2 SENSITIVE Sensitive     IMIPENEM >=16 RESISTANT Resistant     TRIMETH/SULFA <=20 SENSITIVE Sensitive     Extended ESBL POSITIVE Resistant     PIP/TAZO Value in next row Resistant      RESISTANT>=128    CIPROFLOXACIN Value in next row Sensitive      SENSITIVE<=0.25    * HEAVY GROWTH ESCHERICHIA COLI   Klebsiella pneumoniae - MIC*    AMPICILLIN Value in next row Resistant  SENSITIVE<=0.25    CEFTAZIDIME Value in next row Resistant      SENSITIVE<=0.25    CEFAZOLIN Value in next row Resistant      SENSITIVE<=0.25    CEFTRIAXONE Value in next row Resistant      SENSITIVE<=0.25    CIPROFLOXACIN Value in next row Resistant      SENSITIVE<=0.25    GENTAMICIN Value in next row Sensitive      SENSITIVE<=0.25    IMIPENEM Value in next row Resistant      SENSITIVE<=0.25    TRIMETH/SULFA Value in next row Resistant      SENSITIVE<=0.25    PIP/TAZO Value in next row Resistant      RESISTANT>=128    * LIGHT GROWTH KLEBSIELLA PNEUMONIAE   Proteus mirabilis - MIC*    AMPICILLIN Value in next row Resistant      RESISTANT>=128    CEFTAZIDIME Value in next row Sensitive      RESISTANT>=128    CEFAZOLIN Value in next row Sensitive      RESISTANT>=128    CEFTRIAXONE Value in next row Sensitive      RESISTANT>=128    CIPROFLOXACIN Value in next row Sensitive      RESISTANT>=128    GENTAMICIN Value in next row Sensitive      RESISTANT>=128    IMIPENEM Value in next row Sensitive      RESISTANT>=128    TRIMETH/SULFA Value in next row Sensitive      RESISTANT>=128    PIP/TAZO Value in next row Sensitive      SENSITIVE<=4    * MODERATE GROWTH PROTEUS MIRABILIS    Enterococcus gallinarum - MIC*    AMPICILLIN Value in next row Resistant      SENSITIVE<=4    GENTAMICIN SYNERGY Value in next row Sensitive      SENSITIVE<=4    CIPROFLOXACIN Value in next row Resistant      RESISTANT>=8    TETRACYCLINE Value in next row Resistant      RESISTANT>=16    * MODERATE GROWTH ENTEROCOCCUS GALLINARUM  Wound culture     Status: None   Collection Time: 08/15/15  2:53 PM  Result Value Ref Range Status   Specimen Description WOUND  Final   Special Requests NONE  Final   Gram Stain FEW WBC SEEN NO ORGANISMS SEEN   Final   Culture NO GROWTH 3 DAYS  Final   Report Status 08/18/2015 FINAL  Final  C difficile quick scan w PCR reflex     Status: None   Collection Time: 08/17/15 11:34 AM  Result Value Ref Range Status   C Diff antigen NEGATIVE NEGATIVE Final   C Diff toxin NEGATIVE NEGATIVE Final   C Diff interpretation Negative for C. difficile  Final    Coagulation Studies: No results for input(s): LABPROT, INR in the last 72 hours.  Urinalysis: No results for input(s): COLORURINE, LABSPEC, PHURINE, GLUCOSEU, HGBUR, BILIRUBINUR, KETONESUR, PROTEINUR, UROBILINOGEN, NITRITE, LEUKOCYTESUR in the last 72 hours.  Invalid input(s): APPERANCEUR    Imaging: No results found.   Medications:   . norepinephrine (LEVOPHED) Adult infusion Stopped (08/24/15 0755)  . pureflow 3 each (08/25/15 0826)   . acidophilus  1 capsule Oral Daily  . antiseptic oral rinse  7 mL Mouth Rinse q12n4p  . [START ON 08/29/2015] aspirin  324 mg Oral Daily  . budesonide (PULMICORT) nebulizer solution  0.5 mg Nebulization BID  . chlorhexidine  15 mL Mouth Rinse BID  . DAPTOmycin (CUBICIN)  IV  500 mg Intravenous Q48H  . free water  100 mL Per Tube 3 times per day  . hydrocortisone sod succinate (SOLU-CORTEF) inj  25 mg Intravenous Q8H  . insulin aspart  0-15 Units Subcutaneous 6 times per day  . ipratropium-albuterol  3 mL Nebulization BID  . lidocaine  1 patch Transdermal Q24H   . meropenem (MERREM) IV  500 mg Intravenous q1800  . midodrine  10 mg Oral TID WC  . multivitamin  5 mL Per Tube Daily  . nystatin   Topical TID  . pantoprazole (PROTONIX) IV  40 mg Intravenous Q12H  . potassium phosphate IVPB (mmol)  15 mmol Intravenous Once  . sodium hypochlorite   Irrigation BID   sodium chloride, sodium chloride, alteplase, anticoagulant sodium citrate, lidocaine (PF), lidocaine-prilocaine, loperamide, morphine injection, pentafluoroprop-tetrafluoroeth  Assessment/ Plan:  49 y.o. 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  1. End-stage renal disease on hemodialysis on HD TTHS. The patient has been on dialysis since October of 2014. R IJ permcath.  - Taken off CVVHD. Will attempt treatment of intermittent hemodialysis. Uf goal of 1 litre. If patient tolerates this, plan to resume TTS schedule.   2. Anemia of CKD: status post PRBC transfusions. - Will give epo with hemodialysis treatment IV 10,000 units  3. Secondary hyperparathyroidism: PTH low at 55. -  phos at goal. Completed IV replacement yesterday.   - low due to poor PO intake, loss of muscle mass and CRRT clearance. Will need to monitor.   4. Hypotension: off vassopressors.  - continue midodrine and solucortef - Continue empiric antibiotics for sepsis with daptomycin and meropenem.    LOS: 15 Preeti Winegardner 10/8/20169:26 AM

## 2015-08-27 NOTE — Progress Notes (Signed)
ELECTROLYTE MANAGEMENT - FOLLOW UP   Pharmacy Consult for Electrolyte Management Indication: electrolyte management   Allergies  Allergen Reactions  . Contrast Media [Iodinated Diagnostic Agents] Anaphylaxis  . Ampicillin Rash    Patient Measurements: Height:  (175.3 cm) Weight: 185 lb 3 oz (84 kg) IBW/kg (Calculated) : 66.2   Vital Signs: Temp: 98.1 F (36.7 C) (10/08 0400) Temp Source: Oral (10/08 0015) BP: 138/60 mmHg (10/08 0600) Pulse Rate: 85 (10/08 0600) Intake/Output from previous day: 10/07 0701 - 10/08 0700 In: 743.3 [NG/GT:370; IV Piggyback:373.3] Out: 47  Intake/Output from this shift:    Labs:  Recent Labs  08/25/15 0426 08/25/15 1616 08/26/15 0408 08/27/15 0410  WBC 7.0  --  10.2 9.9  HGB 6.8* 8.9* 9.2* 8.7*  HCT 20.4* 26.7* 27.8* 26.5*  PLT 76*  --  59* 62*  APTT 43*  --  41* 32     Recent Labs  08/24/15 2211 08/25/15 0426  08/25/15 2103 08/26/15 0408 08/26/15 1144 08/27/15 0410  NA 139 141  < > 140 143  141 139 140  K 4.3 4.0  < > 3.9 4.0  3.8 3.8 3.9  CL 109 109  < > 110 109  109 108 108  CO2 27 29  < > GLUCOSE 74 76  < > 75 79  79 75 88  BUN 15 13  < > CREATININE 0.46 0.41*  < > 0.41* 0.44  0.47 0.47 0.65  CALCIUM 7.7* 7.9*  < > 7.9* 8.2*  8.0* 7.8* 7.8*  MG 1.9 1.8  --   --   --   --  1.6*  PHOS 2.5 2.1*  2.1*  < > 1.9* 1.7* 1.8* 2.7  ALBUMIN 1.9* 1.8*  < > 2.1* 2.1* 2.0*  --   < > = values in this interval not displayed. Estimated Creatinine Clearance: 97.4 mL/min (by C-G formula based on Cr of 0.65).    Recent Labs  08/26/15 2357 08/27/15 0031 08/27/15 0340  GLUCAP 67 117* 85    Medical History: Past Medical History  Diagnosis Date  . Obesity   . Dyslipidemia   . Hypertension   . Coronary artery disease     s/p BMS 2010 LAD  . Dysrhythmia     ventricular tachycardia resolved after LAD stent and beta blocker  . Diabetes mellitus     with retinopathy, neuropathy  and microalbuminemia  . ESRD (end stage renal disease) on dialysis     Medications:  Scheduled:  . acidophilus  1 capsule Oral Daily  . antiseptic oral rinse  7 mL Mouth Rinse q12n4p  . [START ON 08/29/2015] aspirin  324 mg Oral Daily  . budesonide (PULMICORT) nebulizer solution  0.5 mg Nebulization BID  . chlorhexidine  15 mL Mouth Rinse BID  . DAPTOmycin (CUBICIN)  IV  500 mg Intravenous Q48H  . epoetin (EPOGEN/PROCRIT) injection  20,000 Units Subcutaneous Weekly  . free water  100 mL Per Tube 3 times per day  . hydrocortisone sod succinate (SOLU-CORTEF) inj  25 mg Intravenous Q8H  . insulin aspart  0-15 Units Subcutaneous 6 times per day  . ipratropium-albuterol  3 mL Nebulization BID  . lidocaine  1 patch Transdermal Q24H  . magnesium sulfate 1 - 4 g bolus IVPB  2 g Intravenous Once  . meropenem (MERREM) IV  500 mg Intravenous q1800  . midodrine  10 mg Oral TID WC  .  multivitamin  5 mL Per Tube Daily  . nystatin   Topical TID  . pantoprazole (PROTONIX) IV  40 mg Intravenous Q12H  . potassium phosphate IVPB (mmol)  15 mmol Intravenous Once  . sodium hypochlorite   Irrigation BID    Assessment: 49 yo female admitted for sepsis, found to have hypokalemia. Pharmacy consulted for electrolyte management.  CCa: 9.4  Plan:  Magnesium is low today at 1.6. Will replace with magnesium sulfate 2 g iv once and f/u am labs.    Pharmacy will continue to monitor and adjust per consult.    Luisa Hart D, Pharm.D. Clinical Pharmacist 08/27/2015,7:17 AM

## 2015-08-27 NOTE — Progress Notes (Signed)
NEUROLOGY NOTE  S: Still real changes today per nursing  ROS unobtainable to aphasia  O: 98.8    155/76    82     18 No distress, overweight Normocephalic, oropharynx clear Supple, no JVD CTA B, no wheezing tachycardic, no murmur Diffuse edema, no C/C  Alert again today and somewhat following on L, mute with trach Pupils 4mm and sluggish, weak R corneal, good L corneal, R droop Localizes on L, trace movement on R  A/P: 1. 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 This will still cause permanent deficits of R hemiparesis and aphasia which will not likely get better; Pt has problems following still 2. Encephalopathy- improvement again today but these can fluctuate - Start amantadine  BID PO -  NEEDS Hem/Onc consult to determine etiology of low platelets and help to determine if there is some underlying hypercoaguable disorder which led to stroke - Still needs TEE when stable but now pt cant be anticoagulated anyhow - Will need hypercoaguable w/u if TEE is neg - Would start ASA as soon as possible from medical standpoint - Try to keep Hgb > 8 - Keep MAP > 60 for good perfusion - Will call husband via phone to update - Will still follow with you

## 2015-08-27 NOTE — Progress Notes (Signed)
Patient remains on 30% o2 through trach and will need to be 28% prior to discharge. Patient has been on CRRT. Per RN,  CRRT was discontinued on Friday.    Per RN the plan today is to start tube feeds and hemodialysis.  CSW continues to follow patient for disposition needs related to patient to return to SNF at [facility].   Sammuel Hines. Theresia Majors, MSW Clinical Social Work Department Emergency Room [phone] 1:56 PM

## 2015-08-27 NOTE — Progress Notes (Signed)
[name] Progess Note    ASSESSMENT/PLAN  Patient is status post Gastric bypass with sleeve gastrectomy March 2014 at [facility] complicated by SBO requiring lysis of adhesions with subsequent small bowel enterotomy resulting in shock and multiple cardiac arrests, chronic respiratory failure s/p tracheostomy, DVT/PE on coumadin, HTN, DM2, OSA, COPD, ESRD. Intra-abdominal abscesses s/p lap LOA and drainage 03/2013. Subsequently has experienced numerous infections and hospitalizations since that time. Most recently on this admission she is admitted for severe septic shock and acute kidney injury as well as chronically contaminated decubitus ulcer. Her course on this admission has been, complicated by ischemic stroke, likely cardioembolic, and GI bleeding suspected to be from a rectal ulcer complicated by presence of a fecal tube.    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 History of obstructive sleep apnea History of COPD P:  Supplemental O2 to maintain SpO2 90-96% If requires vent support, will need to change trach to cuffed Currently doing well with trach collar and has not required ventilator support. -Patient is noted to have copious secretions with concomitant drop in her oxygen saturation. This responds to suctioning.   CARDIOVASCULAR A:  -Initial septic shock on pressors, subsequently developed hemorrhagic shock. Currently weaned off of all pressors. -Chronic steroids - possible component of adrenal insufficiency,currently on steroids. -Nonsustained ventricular tachycardia; history of coronary artery disease, diastolic congestive heart failure, as well as monomorphic and polymorphic ventricular tachycardia on previous admissions, which had responded to amiodarone. -History of cardiac arrest 3. -Echocardiogram from 08/22/2015 results were reviewed; ejection fraction was  55-60%, pulmonary systolic pressure was 39 mmHg P:  MAP goal > 60 mmHg Taper stress dose steroids as permitted by BP - we'll continue to monitor.  RENAL A:  ESRD - off CRRT-plan for HD today.  Hypokalemia, resolved P:  Monitor BMET intermittently Monitor I/Os Correct electrolytes as indicated -Plan to change to hemodialysis from CRRT.  GASTROINTESTINAL A:  -Gastrointestinal bleeding , appears to be doing better. Her stool amount has decreased, with no frank blood and more clots. -Continue to monitor stool output, restart tube feeding at lower rate -Hx of C. Diff - PCR neg -Incontinent of stool with chronic contamination of sacral decubitus ulcer. P:  -No anticoagulation, aspirin was discontinued due to GI bleeding, can consider restarting on Monday if hemoglobins are stable. -c.diff PCR>>neg -As we have not ruled out gastritis or peptic ulcer disease as a cause of the patient's Intestinal bleeding, we will continue IV Protonix with twice a day dosing. Can consider change to once daily dosing if no further episodes of bleeding.  HEMATOLOGIC A:  Acute on chronic anemia Thrombocytopenia, . Etiology unclear. Platelet counts have been improving P:  DVT px: SCDs Monitor CBC intermittently Transfuse per usual guidelines -HIT panel 9/25 - negative -will follow hem.ONC recs  INFECTIOUS A:  -Severe sepsis, resolved. Was secondary to infected sacral decubitus ulcer. -Sacral decubitus ulcer with abscess -decubitus ulcer with history of osteomyelitis. 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 P:  Monitor temp, WBC count Micro and abx as above   ENDOCRINE A:  DM 2, controlled Chronic prednisone therapy - was on chronic dose 10 mg BID Secondary adrenal insuff P:  CBGs and SSI - mod scale -Stress dose steroids -  wean as able based on BP; will decrease dose of hydrocortisone IV to  25 mg every 8 hours.  NEUROLOGIC A:  Acute encephalopathy, -somnolent still, suspect due to chronic sepsis with metabolic encephalopathy, and, complicated by development of CVA. Follow up Neuro recs -will consider TEE next week  Recommend Transitioning to [facility]   ADMISSION DATE: August 31, 2015 CONSULTATION DATE: 9/23  INITIAL PRESENTATION:  30 F who has been in medical facilities (hosp, LTAC, rehab) for 2 yrs following gastric bypass surgery with multiple complications. Now with chronic trach, ESRD, profound debilitation, severe sacral pressure ulcer. Was seemingly making progress and transferred to rehab facility approx one week prior to this admission. She was sent to [facility] ED with AMS and hypotension. Working dx of severe sepsis/septic shock due to infected sacral pressure ulcer. Also has R side externalized HD cath and tunneled L IJ CVL. She has history of resistant bacterial infections. She is on chronic steroids. Her husband indicates that she has been diagnosed with adrenal insuff at some point during the past 2 yrs  MAJOR EVENTS/TEST RESULTS:  Admission 02/07/14-05/07/14 Admission 07/21/14-09/06/14 Discharged to [facility]. Pt had palliative consult at that time, were asked to sign off by husband.  9/23 CT head: NAD 9/23 EEG: no epileptiform activity 9/23 PRBCs for Hgb 6.4 9/24 bedside debridement of sacral wound. Abscess drained 9/25 Off vasopressors. More alert. No distress. Worsening thrombocytopenia. Vanc DC'd 9/29  Dr. Sampson Goon (I.D) excused from the case by patient's husband. 10/2 MRI -multiple infarcts 10/3 tracheal bleeding- transfused platelets 10/3 hospitalist service excused from the case by patient's husband 08/22/2015 Echocardiogram ejection fraction was 55-60%, pulmonary systolic pressure was 39 mmHg [id number] TF's at lower rate, attempt reg HD   INDWELLING DEVICES:: Trach (chronic) placed June  2014 Tunneled R IJ HD cath (chronic) Tunneled L IJ CVL (chronic) L femoral A-line 9/23 >> 9/25  MICRO DATA: 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 History of sepsis from C. glabrata MRSA PCR 9/23 >> NEG Urine 9/23 >>  Wound (swab) 9/23 >> many GNR, rare GPC >>  Blood 9/23 >> neg  Wound (debridement) 9/24 >> heavy GNR >> CRE, Enterococcus, K. Pneumoniae, P. Mirabilis, VRE CDiff 9/27>>neg  ANTIMICROBIALS:  Aztreonam 9/23 >> 9/24 Vanc 9/23 >> 9/25 Vanc 9/26>>9/27 Meropenem 9/23 >>  Daptomycin 9/27>>   ---------------------------------------   ----------------------------------------   Name: Courtney [name] MRN: [id number] DOB: August 09, 1966    ADMISSION DATE:  08/31/15    CHIEF COMPLAINT:  dyspnea     SUBJECTIVE:   Pt currently nonverbal, can not provide history or review of systems. However, she appears to be awake and opens eyes spontaneously. she does not appear to move her right side spontaneously.  Review of Systems:  Pt currently nonverbal, can not provide history or review of systems.    VITAL SIGNS: Temp:  [98.1 F (36.7 C)-98.2 F (36.8 C)] 98.1 F (36.7 C) (10/08 0400) Pulse Rate:  [77-110] 85 (10/08 0600) Resp:  [0-26] 18 (10/08 0600) BP: (125-155)/(50-114) 138/60 mmHg (10/08 0600) SpO2:  [100 %] 100 % (10/08 0600) FiO2 (%):  [30 %] 30 % (10/07 1534) Weight:  [185 lb 3 oz (84 kg)] 185 lb 3 oz (84 kg) (10/08 0416) HEMODYNAMICS:   VENTILATOR SETTINGS: Vent Mode:  [-]  FiO2 (%):  [30 %] 30 % INTAKE / OUTPUT:  Intake/Output Summary (Last 24 hours) at 08/27/15 1014 Last data filed at 08/27/15 0600  Gross per 24 hour  Intake    400 ml  Output      0 ml  Net  400 ml    PHYSICAL EXAMINATION: Physical Examination:   VS: BP 138/60 mmHg  Pulse 85  Temp(Src) 98.1 F (36.7 C) (Oral)  Resp 18  Ht  (1.753 m)  Wt 185 lb 3 oz (84 kg)  BMI 27.33 kg/m2  SpO2 100%    General Appearance: No distress  Neuro:, mental status reduced, though better than yesterday. Right upper and lower extremity strength 0 out of 5 left upper and left lower extremity strength, 1 out of 5 HEENT: PERRLA, EOM intact. Pulmonary: normal breath sounds   CardiovascularNormal S1,S2.  No m/r/g.   Abdomen: Benign, Soft, non-tender. Renal:  No costovertebral tenderness  GU:  Not performed at this time. Endocrine: No evident thyromegaly. Skin:   warm, no rashes, no ecchymosis  Extremities: normal, no cyanosis, clubbing.   LABS:   LABORATORY PANEL:   CBC  Recent Labs Lab 08/27/15 0410  WBC 9.9  HGB 8.7*  HCT 26.5*  PLT 62*    Chemistries   Recent Labs Lab 08/22/15 0539  08/27/15 0410  NA 139  < > 140  K 4.2  < > 3.9  CL 105  < > 108  CO2 30  < > 27  GLUCOSE 126*  < > 88  BUN 26*  < > 15  CREATININE 0.60  < > 0.65  CALCIUM 8.0*  < > 7.8*  MG 1.8  < > 1.6*  PHOS 2.0*  < > 2.7  AST 22  --   --   ALT 19  --   --   ALKPHOS 454*  --   --   BILITOT 0.6  --   --   < > = values in this interval not displayed.   Recent Labs Lab 08/26/15 1625 08/26/15 1957 08/26/15 2357 08/27/15 0031 08/27/15 0340 08/27/15 0724  GLUCAP 82 74 67 117* 85 86   No results for input(s): PHART, PCO2ART, PO2ART in the last 168 hours.  Recent Labs Lab 08/22/15 0539  08/25/15 2103 08/26/15 0408 08/26/15 1144  AST 22  --   --   --   --   ALT 19  --   --   --   --   ALKPHOS 454*  --   --   --   --   BILITOT 0.6  --   --   --   --   ALBUMIN 2.0*  < > 2.1* 2.1* 2.0*  < > = values in this interval not displayed.  Cardiac Enzymes No results for input(s): TROPONINI in the last 168 hours.  RADIOLOGY:  No results found.   I have personally obtained a history, examined the patient, evaluated Pertinent laboratory and RadioGraphic/imaging results, and  formulated the assessment and plan   The Patient requires high complexity decision making for assessment and support,  frequent evaluation and titration of therapies. Time Spent with patient 45 mins  Husband  are satisfied with Plan of action and management. All questions answered  Lucie Leather, M.D.  [facility]  Medical Director [facility] Medical Director [facility] Cardio-Pulmonary Department

## 2015-08-28 ENCOUNTER — Inpatient Hospital Stay: Payer: 59

## 2015-08-28 LAB — BASIC METABOLIC PANEL
ANION GAP: 3 — AB (ref 5–15)
BUN: 21 mg/dL — ABNORMAL HIGH (ref 6–20)
CHLORIDE: 117 mmol/L — AB (ref 101–111)
CO2: 23 mmol/L (ref 22–32)
Calcium: 6.3 mg/dL — CL (ref 8.9–10.3)
Creatinine, Ser: 0.74 mg/dL (ref 0.44–1.00)
GFR calc non Af Amer: >60 mL/min (ref >60)
GLUCOSE: 89 mg/dL (ref 65–99)
Potassium: 3.1 mmol/L — ABNORMAL LOW (ref 3.5–5.1)
Sodium: 143 mmol/L (ref 135–145)

## 2015-08-28 LAB — CBC WITH DIFFERENTIAL/PLATELET
Basophils Absolute: 0 10*3/uL (ref 0–0.1)
Basophils Relative: 0 %
Eosinophils Absolute: 0 10*3/uL (ref 0–0.7)
Eosinophils Relative: 0 %
HEMATOCRIT: 28.7 % — AB (ref 35.0–47.0)
HEMOGLOBIN: 9.3 g/dL — AB (ref 12.0–16.0)
LYMPHS ABS: 0.8 10*3/uL — AB (ref 1.0–3.6)
Lymphocytes Relative: 9 %
MCH: 29.5 pg (ref 26.0–34.0)
MCHC: 32.5 g/dL (ref 32.0–36.0)
MCV: 90.8 fL (ref 80.0–100.0)
MONO ABS: 0.4 10*3/uL (ref 0.2–0.9)
NEUTROS ABS: 7.6 10*3/uL — AB (ref 1.4–6.5)
Platelets: 79 10*3/uL — ABNORMAL LOW (ref 150–440)
RBC: 3.16 MIL/uL — ABNORMAL LOW (ref 3.80–5.20)
RDW: 17.5 % — AB (ref 11.5–14.5)
WBC: 8.9 10*3/uL (ref 3.6–11.0)

## 2015-08-28 LAB — MAGNESIUM: Magnesium: 1.6 mg/dL — ABNORMAL LOW (ref 1.7–2.4)

## 2015-08-28 LAB — RENAL FUNCTION PANEL
ANION GAP: 5 (ref 5–15)
Albumin: 1.9 g/dL — ABNORMAL LOW (ref 3.5–5.0)
BUN: 32 mg/dL — ABNORMAL HIGH (ref 6–20)
CALCIUM: 7.9 mg/dL — AB (ref 8.9–10.3)
CHLORIDE: 108 mmol/L (ref 101–111)
CO2: 27 mmol/L (ref 22–32)
Creatinine, Ser: 1.13 mg/dL — ABNORMAL HIGH (ref 0.44–1.00)
GFR calc non Af Amer: 56 mL/min — ABNORMAL LOW (ref >60)
Glucose, Bld: 135 mg/dL — ABNORMAL HIGH (ref 65–99)
POTASSIUM: 4.1 mmol/L (ref 3.5–5.1)
Phosphorus: 3.6 mg/dL (ref 2.5–4.6)
Sodium: 140 mmol/L (ref 135–145)

## 2015-08-28 LAB — CBC
HEMATOCRIT: 26.9 % — AB (ref 35.0–47.0)
HEMOGLOBIN: 8.7 g/dL — AB (ref 12.0–16.0)
MCH: 29.4 pg (ref 26.0–34.0)
MCHC: 32.2 g/dL (ref 32.0–36.0)
MCV: 91.2 fL (ref 80.0–100.0)
Platelets: 82 10*3/uL — ABNORMAL LOW (ref 150–440)
RBC: 2.95 MIL/uL — AB (ref 3.80–5.20)
RDW: 18.1 % — ABNORMAL HIGH (ref 11.5–14.5)
WBC: 10.3 10*3/uL (ref 3.6–11.0)

## 2015-08-28 LAB — GLUCOSE, CAPILLARY
GLUCOSE-CAPILLARY: 97 mg/dL (ref 65–99)
GLUCOSE-CAPILLARY: 111 mg/dL — AB (ref 65–99)
Glucose-Capillary: 99 mg/dL (ref 65–99)
Glucose-Capillary: 113 mg/dL — ABNORMAL HIGH (ref 65–99)
Glucose-Capillary: 112 mg/dL — ABNORMAL HIGH (ref 65–99)

## 2015-08-28 LAB — PHOSPHORUS: PHOSPHORUS: 2.6 mg/dL (ref 2.5–4.6)

## 2015-08-28 LAB — APTT: aPTT: 30 seconds (ref 24–36)

## 2015-08-28 MED ORDER — POTASSIUM CHLORIDE CRYS ER 20 MEQ PO TBCR
20.0000 meq | EXTENDED_RELEASE_TABLET | Freq: Two times a day (BID) | ORAL | Status: DC
  Administered 2015-08-28: 20 meq via ORAL
  Filled 2015-08-28 (×2): qty 1

## 2015-08-28 MED ORDER — POTASSIUM CHLORIDE 20 MEQ PO PACK
20.0000 meq | PACK | Freq: Two times a day (BID) | ORAL | Status: AC
  Administered 2015-08-28 (×2): 20 meq via ORAL
  Filled 2015-08-28 (×2): qty 1

## 2015-08-28 MED ORDER — ACETAMINOPHEN 160 MG/5ML PO SOLN
650.0000 mg | Freq: Four times a day (QID) | ORAL | Status: DC | PRN
  Administered 2015-08-28 – 2015-09-15 (×12): 650 mg via ORAL
  Filled 2015-08-28 (×11): qty 20.3

## 2015-08-28 MED ORDER — AMANTADINE HCL 50 MG/5ML PO SYRP
200.0000 mg | ORAL_SOLUTION | ORAL | Status: DC
  Administered 2015-08-28 – 2015-09-11 (×3): 200 mg via ORAL
  Filled 2015-08-28 (×3): qty 20

## 2015-08-28 MED ORDER — HEPARIN SODIUM (PORCINE) 1000 UNIT/ML IJ SOLN
1000.0000 [IU] | Freq: Once | INTRAMUSCULAR | Status: AC
  Administered 2015-08-28: 4500 [IU]

## 2015-08-28 MED ORDER — SODIUM CHLORIDE 0.9 % IV SOLN
1.0000 g | Freq: Once | INTRAVENOUS | Status: AC
  Administered 2015-08-28: 1 g via INTRAVENOUS
  Filled 2015-08-28: qty 10

## 2015-08-28 MED ORDER — MAGNESIUM SULFATE 2 GM/50ML IV SOLN
2.0000 g | Freq: Once | INTRAVENOUS | Status: AC
  Administered 2015-08-28: 2 g via INTRAVENOUS
  Filled 2015-08-28: qty 50

## 2015-08-28 NOTE — Progress Notes (Signed)
Patient remains on trach collar in no distress. bbs clear. Trach secure. Small amount of clear to white secretions suctioned from trach. Vitals and patient stable throughout procedure. Patient remains on trach collar 28% respiratory treatment given as ordered

## 2015-08-28 NOTE — Progress Notes (Signed)
Nutrition Follow-up  DOCUMENTATION CODES:   Severe malnutrition in context of acute illness/injury  INTERVENTION:   EN: spoke with Lawernce Ion RN via phone, husband present in room during phone conversation. Per husband, MD wanting to continue TF at current rate of 15 ml/hr for 24 hours. Will follow-up tomorrow   NUTRITION DIAGNOSIS:   Inadequate oral intake related to wound healing, acute illness, chronic illness as evidenced by NPO status, estimated needs.  GOAL:   Patient will meet greater than or equal to 90% of their needs   MONITOR:    (Energy Intake, Anthropometrics, Digestive System, Electrolyte/Renal Profile, Glucose Profile)  REASON FOR ASSESSMENT:   Consult Enteral/tube feeding initiation and management  ASSESSMENT:     Diet Order:  Diet NPO time specified   EN: pt tolerating Jevity 1.5 TF at rate of 15 ml/hr  Digestive System: pt with liquid stools today per Lawernce Ion RN, no signs of bleeding, no vomitting  Electrolyte and Renal Profile:  Recent Labs Lab 08/25/15 0426  08/26/15 1144 08/27/15 0410 08/28/15 0340  BUN 13  < > 12 15 21*  CREATININE 0.41*  < > 0.47 0.65 0.74  NA 141  < > 139 140 143  K 4.0  < > 3.8 3.9 3.1*  MG 1.8  --   --  1.6* 1.6*  PHOS 2.1*  2.1*  < > 1.8* 2.7 2.6  < > = values in this interval not displayed. Glucose Profile:  Recent Labs  08/27/15 2329 08/28/15 0335 08/28/15 0713  GLUCAP 112* 97 99   Nutritional Anemia Profile:  CBC Latest Ref Rng 08/28/2015 08/27/2015 08/26/2015  WBC 3.6 - 11.0 K/uL 8.9 9.9 10.2  Hemoglobin 12.0 - 16.0 g/dL 1.6(X) 0.9(U) 0.4(V)  Hematocrit 35.0 - 47.0 % 28.7(L) 26.5(L) 27.8(L)  Platelets 150 - 440 K/uL 79(L) 62(L) 59(L)    Meds: reviewed  Height:   Ht Readings from Last 1 Encounters:  07/29/2015  (1.753 m)    Weight:   Wt Readings from Last 1 Encounters:  08/28/15 182 lb 15.7 oz (83 kg)   Filed Weights   08/26/15 0700 08/27/15 0416 08/28/15 0500  Weight: 182 lb 15.7 oz (83 kg)  185 lb 3 oz (84 kg) 182 lb 15.7 oz (83 kg)    BMI:  Body mass index is 27.01 kg/(m^2).  Estimated Nutritional Needs:   Kcal:  2066-2409 kcals (BEE 1434, 1.2 AF, 1.2-1.4 IF)   Protein:  113-150 g/kg (1.5-2.0 g/kg) but may be closer to 150-188 g (2.0-2.5 g/kg)   Fluid:  1000 mL plus UOP  EDUCATION NEEDS:   No education needs identified at this time  HIGH Care Level  Romelle Starcher MS, RD, LDN [phone] Pager

## 2015-08-28 NOTE — Progress Notes (Signed)
[facility] Progess Note    ASSESSMENT/PLAN  Patient is status post Gastric bypass with sleeve gastrectomy March 2014 at [facility] complicated by SBO requiring lysis of adhesions with subsequent small bowel enterotomy resulting in shock and multiple cardiac arrests, chronic respiratory failure s/p tracheostomy, DVT/PE on coumadin, HTN, DM2, OSA, COPD, ESRD. Intra-abdominal abscesses s/p lap LOA and drainage 03/2013. Subsequently has experienced numerous infections and hospitalizations since that time. Most recently on this admission she is admitted for severe septic shock and acute kidney injury as well as chronically contaminated decubitus ulcer. Her course on this admission has been, complicated by ischemic stroke, likely cardioembolic, and GI bleeding suspected to be from a rectal ulcer complicated by presence of a fecal tube.    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 History of obstructive sleep apnea History of COPD P:  Supplemental O2 to maintain SpO2 >90 If requires vent support, will need to change trach to cuffed Currently doing well with trach collar and has not required ventilator support. -Patient is noted to have copious secretions with concomitant drop in her oxygen saturation. This responds to suctioning.   CARDIOVASCULAR A:  -Initial septic shock on pressors, subsequently developed hemorrhagic shock. Currently weaned off of all pressors. -Chronic steroids - possible component of adrenal insufficiency,currently on steroids. -Nonsustained ventricular tachycardia; history of coronary artery disease, diastolic congestive heart failure, as well as monomorphic and polymorphic ventricular tachycardia on previous admissions, which had responded to amiodarone. -History of cardiac arrest 3. -Echocardiogram from 08/22/2015 results were reviewed; ejection fraction was  55-60%, pulmonary systolic pressure was 39 mmHg P:  MAP goal > 60 mmHg Taper stress dose steroids as permitted by BP - we'll continue to monitor.  RENAL A:  ESRD - off CRRT-plan for HD today.  Hypokalemia, resolved P:  Monitor BMET intermittently Monitor I/Os Correct electrolytes as indicated   GASTROINTESTINAL A:  -Gastrointestinal bleeding , appears to be doing better. Her stool amount has decreased, with no frank blood and more clots. -Continue to monitor stool output, restart tube feeding at lower rate -Hx of C. Diff - PCR neg -Incontinent of stool with chronic contamination of sacral decubitus ulcer. P:  -No anticoagulation, aspirin was discontinued due to GI bleeding, can consider restarting on Monday if hemoglobins are stable. -watch Platelet counts as well -c.diff PCR>>neg -As we have not ruled out gastritis or peptic ulcer disease as a cause of the patient's Intestinal bleeding, we will continue IV Protonix with twice a day dosing. Can consider change to once daily dosing if no further episodes of bleeding.  HEMATOLOGIC A:  Acute on chronic anemia Thrombocytopenia, . Etiology unclear. Platelet counts have been improving P:  DVT px: SCDs Monitor CBC intermittently Transfuse per usual guidelines -HIT panel 9/25 - negative -will follow hem.ONC recs  INFECTIOUS A:  -Severe sepsis, resolved. Was secondary to infected sacral decubitus ulcer. -Sacral decubitus ulcer with abscess -decubitus ulcer with history of osteomyelitis. 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 P:  Monitor temp, WBC count Micro and abx as above   ENDOCRINE A:  DM 2, controlled Chronic prednisone therapy - was on chronic dose 10 mg BID Secondary adrenal insuff P:  CBGs and SSI - mod scale -Stress dose steroids - wean as  able based on BP; will decrease dose of hydrocortisone IV to 25  mg every 8 hours.  NEUROLOGIC A:  Acute encephalopathy, -somnolent still, suspect due to chronic sepsis with metabolic encephalopathy, and, complicated by development of CVA. Follow up Neuro recs -will consider TEE next week  Recommend Transitioning to [facility]   ADMISSION DATE: 07/29/2015 CONSULTATION DATE: 9/23  INITIAL PRESENTATION:  51 F who has been in medical facilities (hosp, LTAC, rehab) for 2 yrs following gastric bypass surgery with multiple complications. Now with chronic trach, ESRD, profound debilitation, severe sacral pressure ulcer. Was seemingly making progress and transferred to rehab facility approx one week prior to this admission. She was sent to [facility] ED with AMS and hypotension. Working dx of severe sepsis/septic shock due to infected sacral pressure ulcer. Also has R side externalized HD cath and tunneled L IJ CVL. She has history of resistant bacterial infections. She is on chronic steroids. Her husband indicates that she has been diagnosed with adrenal insuff at some point during the past 2 yrs  MAJOR EVENTS/TEST RESULTS:  Admission 02/07/14-05/07/14 Admission 07/21/14-09/06/14 Discharged to [facility]. Pt had palliative consult at that time, were asked to sign off by husband.  9/23 CT head: NAD 9/23 EEG: no epileptiform activity 9/23 PRBCs for Hgb 6.4 9/24 bedside debridement of sacral wound. Abscess drained 9/25 Off vasopressors. More alert. No distress. Worsening thrombocytopenia. Vanc DC'd 9/29  Dr. Sampson Goon (I.D) excused from the case by patient's husband. 10/2 MRI -multiple infarcts 10/3 tracheal bleeding- transfused platelets 10/3 hospitalist service excused from the case by patient's husband 08/22/2015 Echocardiogram ejection fraction was 55-60%, pulmonary systolic pressure was 39 mmHg [id number] TF's at lower rate, attempt reg HD   INDWELLING DEVICES:: Trach (chronic) placed June  2014 Tunneled R IJ HD cath (chronic) Tunneled L IJ CVL (chronic) L femoral A-line 9/23 >> 9/25  MICRO DATA: 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 History of sepsis from C. glabrata MRSA PCR 9/23 >> NEG Urine 9/23 >>  Wound (swab) 9/23 >> many GNR, rare GPC >>  Blood 9/23 >> neg  Wound (debridement) 9/24 >> heavy GNR >> CRE, Enterococcus, K. Pneumoniae, P. Mirabilis, VRE CDiff 9/27>>neg  ANTIMICROBIALS:  Aztreonam 9/23 >> 9/24 Vanc 9/23 >> 9/25 Vanc 9/26>>9/27 Meropenem 9/23 >>  Daptomycin 9/27>>   ---------------------------------------   ----------------------------------------   Name: Courtney [name] MRN: [id number] DOB: 02/07/1966    ADMISSION DATE:  08/11/2015    CHIEF COMPLAINT:  dyspnea     SUBJECTIVE:   Pt currently nonverbal, can not provide history or review of systems. However, she appears to be awake and opens eyes spontaneously. she does not appear to move her right side spontaneously. She responds to yes/no questions  Review of Systems:  Pt currently nonverbal, can not provide history or review of systems.    VITAL SIGNS: Temp:  [97.8 F (36.6 C)-99.7 F (37.6 C)] 97.8 F (36.6 C) (10/09 0900) Pulse Rate:  [76-108] 108 (10/09 0900) Resp:  [0-25] 25 (10/09 0900) BP: (107-167)/(52-104) 148/60 mmHg (10/09 0900) SpO2:  [98 %-100 %] 98 % (10/09 0900) FiO2 (%):  [30 %] 30 % (10/09 0900) Weight:  [182 lb 15.7 oz (83 kg)] 182 lb 15.7 oz (83 kg) (10/09 0500) HEMODYNAMICS:   VENTILATOR SETTINGS: Vent Mode:  [-]  FiO2 (%):  [30 %] 30 % INTAKE / OUTPUT:  Intake/Output Summary (Last 24 hours) at 08/28/15 1101 Last data filed at 08/28/15 1036  Gross per 24 hour  Intake    790 ml  Output      0  ml  Net    790 ml    PHYSICAL EXAMINATION: Physical Examination:   VS: BP 148/60 mmHg  Pulse 108  Temp(Src) 97.8 F (36.6 C) (Oral)  Resp 25  Ht  (1.753 m)  Wt 182 lb  15.7 oz (83 kg)  BMI 27.01 kg/m2  SpO2 98%  General Appearance: No distress  Neuro:, mental status reduced, though better than yesterday. Right upper and lower extremity strength 0 out of 5 left upper and left lower extremity strength, 1 out of 5 HEENT: PERRLA, EOM intact. Pulmonary: normal breath sounds   CardiovascularNormal S1,S2.  No m/r/g.   Abdomen: Benign, Soft, non-tender. Renal:  No costovertebral tenderness  GU:  Not performed at this time. Endocrine: No evident thyromegaly. Skin:   warm, no rashes, no ecchymosis  Extremities: normal, no cyanosis, clubbing. NEURO: flaccid ext   LABS:   LABORATORY PANEL:   CBC  Recent Labs Lab 08/28/15 1040  WBC 8.9  HGB 9.3*  HCT 28.7*  PLT 79*    Chemistries   Recent Labs Lab 08/22/15 0539  08/28/15 0340  NA 139  < > 143  K 4.2  < > 3.1*  CL 105  < > 117*  CO2 30  < > 23  GLUCOSE 126*  < > 89  BUN 26*  < > 21*  CREATININE 0.60  < > 0.74  CALCIUM 8.0*  < > 6.3*  MG 1.8  < > 1.6*  PHOS 2.0*  < > 2.6  AST 22  --   --   ALT 19  --   --   ALKPHOS 454*  --   --   BILITOT 0.6  --   --   < > = values in this interval not displayed.   Recent Labs Lab 08/27/15 1107 08/27/15 1601 08/27/15 1933 08/27/15 2329 08/28/15 0335 08/28/15 0713  GLUCAP 85 127* 102* 112* 97 99   No results for input(s): PHART, PCO2ART, PO2ART in the last 168 hours.  Recent Labs Lab 08/22/15 0539  08/25/15 2103 08/26/15 0408 08/26/15 1144  AST 22  --   --   --   --   ALT 19  --   --   --   --   ALKPHOS 454*  --   --   --   --   BILITOT 0.6  --   --   --   --   ALBUMIN 2.0*  < > 2.1* 2.1* 2.0*  < > = values in this interval not displayed.  Cardiac Enzymes No results for input(s): TROPONINI in the last 168 hours.  RADIOLOGY:  No results found.   I have personally obtained a history, examined the patient, evaluated Pertinent laboratory and RadioGraphic/imaging results, and  formulated the assessment and plan   The Patient  requires high complexity decision making for assessment and support, frequent evaluation and titration of therapies. Time Spent with patient 45 mins  Husband  are satisfied with Plan of action and management. All questions answered  Lucie Leather, M.D.  [facility]  Medical Director [facility] Medical Director [facility] Cardio-Pulmonary Department

## 2015-08-28 NOTE — Progress Notes (Signed)
PHARMACY - CRITICAL CARE PROGRESS NOTE  Pharmacy Consult for CRRT Medication Adjustment    Allergies  Allergen Reactions  . Contrast Media [Iodinated Diagnostic Agents] Anaphylaxis  . Ampicillin Rash    Patient Measurements: Height: 5\' 9"  (175.3 cm) Weight: 182 lb 15.7 oz (83 kg) IBW/kg (Calculated) : 66.2   Vital Signs: Temp: 99.2 F (37.3 C) (10/09 0400) Temp Source: Oral (10/09 0000) BP: 155/64 mmHg (10/09 0600) Pulse Rate: 82 (10/09 0600) Intake/Output from previous day: 10/08 0701 - 10/09 0700 In: 765 [NG/GT:715; IV Piggyback:50] Out: -  Intake/Output from this shift:   Vent settings for last 24 hours: Vent Mode:  [-]  FiO2 (%):  [30 %] 30 %  Labs:  Recent Labs  08/25/15 1616 08/25/15 2103 08/26/15 0408 08/26/15 1144 08/27/15 0410 08/28/15 0340  WBC  --   --  10.2  --  9.9  --   HGB 8.9*  --  9.2*  --  8.7*  --   HCT 26.7*  --  27.8*  --  26.5*  --   PLT  --   --  59*  --  62*  --   APTT  --   --  41*  --  32 30  CREATININE  --  0.41* 0.44  0.47 0.47 0.65 0.74  MG  --   --   --   --  1.6* 1.6*  PHOS  --  1.9* 1.7* 1.8* 2.7 2.6  ALBUMIN  --  2.1* 2.1* 2.0*  --   --    Estimated Creatinine Clearance: 96.8 mL/min (by C-G formula based on Cr of 0.74).   Recent Labs  08/27/15 1933 08/27/15 2329 08/28/15 0335  GLUCAP 102* 112* 97    Microbiology: Recent Results (from the past 720 hour(s))  Culture, blood (routine x 2)     Status: None   Collection Time: 08/01/15  7:00 PM  Result Value Ref Range Status   Specimen Description BLOOD RIGHT ANTECUBITAL  Final   Special Requests BOTTLES DRAWN AEROBIC ONLY 5CC  Final   Culture NO GROWTH 5 DAYS  Final   Report Status 08/06/2015 FINAL  Final  Culture, blood (routine x 2)     Status: None   Collection Time: 08/01/15  7:10 PM  Result Value Ref Range Status   Specimen Description BLOOD RIGHT HAND  Final   Special Requests IN PEDIATRIC BOTTLE 3CC  Final   Culture  Setup Time   Final    GRAM POSITIVE  COCCI IN CLUSTERS AEROBIC BOTTLE ONLY CRITICAL RESULT CALLED TO, READ BACK BY AND VERIFIED WITH: D TAYLOR,RN AT 1115 08/02/15 BY L BENFIELD    Culture STAPHYLOCOCCUS SPECIES (COAGULASE NEGATIVE)  Final   Report Status 08/06/2015 FINAL  Final   Organism ID, Bacteria STAPHYLOCOCCUS SPECIES (COAGULASE NEGATIVE)  Final      Susceptibility   Staphylococcus species (coagulase negative) - MIC*    CIPROFLOXACIN >=8 RESISTANT Resistant     ERYTHROMYCIN <=0.25 SENSITIVE Sensitive     GENTAMICIN 8 INTERMEDIATE Intermediate     OXACILLIN >=4 RESISTANT Resistant     TETRACYCLINE <=1 SENSITIVE Sensitive     VANCOMYCIN 1 SENSITIVE Sensitive     TRIMETH/SULFA 160 RESISTANT Resistant     CLINDAMYCIN <=0.25 SENSITIVE Sensitive     RIFAMPIN <=0.5 SENSITIVE Sensitive     Inducible Clindamycin NEGATIVE Sensitive     * STAPHYLOCOCCUS SPECIES (COAGULASE NEGATIVE)  Culture, blood (routine x 2)     Status: None   Collection Time:  08/02/15  7:00 PM  Result Value Ref Range Status   Specimen Description BLOOD RIGHT ANTECUBITAL  Final   Special Requests BOTTLES DRAWN AEROBIC ONLY 10CC  Final   Culture  Setup Time   Final    GRAM POSITIVE COCCI IN CLUSTERS AEROBIC BOTTLE ONLY CRITICAL RESULT CALLED TO, READ BACK BY AND VERIFIED WITH: DR Gwenevere Abbot RN 1549 08/03/15 A BROWNING    Culture   Final    STAPHYLOCOCCUS SPECIES (COAGULASE NEGATIVE) SUSCEPTIBILITIES PERFORMED ON PREVIOUS CULTURE WITHIN THE LAST 5 DAYS.    Report Status 08/06/2015 FINAL  Final  Culture, blood (routine x 2)     Status: None   Collection Time: 08/02/15  7:05 PM  Result Value Ref Range Status   Specimen Description BLOOD RIGHT HAND  Final   Special Requests BOTTLES DRAWN AEROBIC AND ANAEROBIC 10CC  Final   Culture NO GROWTH 5 DAYS  Final   Report Status 08/07/2015 FINAL  Final  Blood Culture (routine x 2)     Status: None   Collection Time: 09-06-2015  8:51 AM  Result Value Ref Range Status   Specimen Description BLOOD UNKO  Final    Special Requests BOTTLES DRAWN AEROBIC AND ANAEROBIC  3CC  Final   Culture NO GROWTH 5 DAYS  Final   Report Status 08/17/2015 FINAL  Final  Blood Culture (routine x 2)     Status: None   Collection Time: 09/06/2015  9:20 AM  Result Value Ref Range Status   Specimen Description BLOOD LEFT ARM  Final   Special Requests BOTTLES DRAWN AEROBIC AND ANAEROBIC  1CC  Final   Culture NO GROWTH 5 DAYS  Final   Report Status 08/17/2015 FINAL  Final  Wound culture     Status: None   Collection Time: 06-Sep-2015  9:20 AM  Result Value Ref Range Status   Specimen Description DECUBITIS  Final   Special Requests Normal  Final   Gram Stain   Final    FEW WBC SEEN MANY GRAM NEGATIVE RODS RARE GRAM POSITIVE COCCI    Culture   Final    HEAVY GROWTH ESCHERICHIA COLI MODERATE GROWTH ENTEROBACTER AEROGENES PROTEUS MIRABILIS HEAVY GROWTH ENTEROCOCCUS SPECIES VRE HAVE INTRINSIC RESISTANCE TO MOST COMMONLY USED ANTIBIOTICS AND THE ABILITY TO ACQUIRE RESISTANCE TO MOST AVAILABLE ANTIBIOTICS.    Report Status 08/16/2015 FINAL  Final   Organism ID, Bacteria ESCHERICHIA COLI  Final   Organism ID, Bacteria ENTEROBACTER AEROGENES  Final   Organism ID, Bacteria PROTEUS MIRABILIS  Final   Organism ID, Bacteria ENTEROCOCCUS SPECIES  Final      Susceptibility   Enterobacter aerogenes - MIC*    CEFTAZIDIME <=1 SENSITIVE Sensitive     CEFAZOLIN >=64 RESISTANT Resistant     CEFTRIAXONE <=1 SENSITIVE Sensitive     CIPROFLOXACIN <=0.25 SENSITIVE Sensitive     GENTAMICIN <=1 SENSITIVE Sensitive     IMIPENEM 1 SENSITIVE Sensitive     TRIMETH/SULFA <=20 SENSITIVE Sensitive     * MODERATE GROWTH ENTEROBACTER AEROGENES   Escherichia coli - MIC*    AMPICILLIN <=2 SENSITIVE Sensitive     CEFTAZIDIME <=1 SENSITIVE Sensitive     CEFAZOLIN <=4 SENSITIVE Sensitive     CEFTRIAXONE <=1 SENSITIVE Sensitive     CIPROFLOXACIN <=0.25 SENSITIVE Sensitive     GENTAMICIN <=1 SENSITIVE Sensitive     IMIPENEM <=0.25 SENSITIVE Sensitive      TRIMETH/SULFA <=20 SENSITIVE Sensitive     * HEAVY GROWTH ESCHERICHIA COLI   Proteus mirabilis -  MIC*    AMPICILLIN >=32 RESISTANT Resistant     CEFTAZIDIME <=1 SENSITIVE Sensitive     CEFAZOLIN 8 SENSITIVE Sensitive     CEFTRIAXONE <=1 SENSITIVE Sensitive     CIPROFLOXACIN <=0.25 SENSITIVE Sensitive     GENTAMICIN <=1 SENSITIVE Sensitive     IMIPENEM 1 SENSITIVE Sensitive     TRIMETH/SULFA <=20 SENSITIVE Sensitive     * PROTEUS MIRABILIS   Enterococcus species - MIC*    AMPICILLIN >=32 RESISTANT Resistant     VANCOMYCIN >=32 RESISTANT Resistant     GENTAMICIN SYNERGY SENSITIVE Sensitive     TETRACYCLINE Value in next row Resistant      RESISTANT>=16    * HEAVY GROWTH ENTEROCOCCUS SPECIES  MRSA PCR Screening     Status: None   Collection Time: 09-Sep-2015  2:38 PM  Result Value Ref Range Status   MRSA by PCR NEGATIVE NEGATIVE Final    Comment:        The GeneXpert MRSA Assay (FDA approved for NASAL specimens only), is one component of a comprehensive MRSA colonization surveillance program. It is not intended to diagnose MRSA infection nor to guide or monitor treatment for MRSA infections.   Blood culture (single)     Status: None   Collection Time: 2015/09/09  3:36 PM  Result Value Ref Range Status   Specimen Description BLOOD RIGHT ASSIST CONTROL  Final   Special Requests BOTTLES DRAWN AEROBIC AND ANAEROBIC  Final   Culture NO GROWTH 5 DAYS  Final   Report Status 08/17/2015 FINAL  Final  Wound culture     Status: None   Collection Time: 08/13/15 12:37 PM  Result Value Ref Range Status   Specimen Description WOUND  Final   Special Requests Normal  Final   Gram Stain   Final    FEW WBC SEEN TOO NUMEROUS TO COUNT GRAM NEGATIVE RODS FEW GRAM POSITIVE COCCI    Culture   Final    HEAVY GROWTH ESCHERICHIA COLI MODERATE GROWTH PROTEUS MIRABILIS LIGHT GROWTH KLEBSIELLA PNEUMONIAE MODERATE GROWTH ENTEROCOCCUS GALLINARUM CRITICAL RESULT CALLED TO, READ BACK BY AND  VERIFIED WITH: [facility] BORBA AT 1042 08/16/15 DV    Report Status 08/17/2015 FINAL  Final   Organism ID, Bacteria ESCHERICHIA COLI  Final   Organism ID, Bacteria PROTEUS MIRABILIS  Final   Organism ID, Bacteria KLEBSIELLA PNEUMONIAE  Final   Organism ID, Bacteria ENTEROCOCCUS GALLINARUM  Final      Susceptibility   Escherichia coli - MIC*    AMPICILLIN >=32 RESISTANT Resistant     CEFTAZIDIME 4 RESISTANT Resistant     CEFAZOLIN >=64 RESISTANT Resistant     CEFTRIAXONE 16 RESISTANT Resistant     GENTAMICIN 2 SENSITIVE Sensitive     IMIPENEM >=16 RESISTANT Resistant     TRIMETH/SULFA <=20 SENSITIVE Sensitive     Extended ESBL POSITIVE Resistant     PIP/TAZO Value in next row Resistant      RESISTANT>=128    CIPROFLOXACIN Value in next row Sensitive      SENSITIVE<=0.25    * HEAVY GROWTH ESCHERICHIA COLI   Klebsiella pneumoniae - MIC*    AMPICILLIN Value in next row Resistant      SENSITIVE<=0.25    CEFTAZIDIME Value in next row Resistant      SENSITIVE<=0.25    CEFAZOLIN Value in next row Resistant      SENSITIVE<=0.25    CEFTRIAXONE Value in next row Resistant      SENSITIVE<=0.25    CIPROFLOXACIN  Value in next row Resistant      SENSITIVE<=0.25    GENTAMICIN Value in next row Sensitive      SENSITIVE<=0.25    IMIPENEM Value in next row Resistant      SENSITIVE<=0.25    TRIMETH/SULFA Value in next row Resistant      SENSITIVE<=0.25    PIP/TAZO Value in next row Resistant      RESISTANT>=128    * LIGHT GROWTH KLEBSIELLA PNEUMONIAE   Proteus mirabilis - MIC*    AMPICILLIN Value in next row Resistant      RESISTANT>=128    CEFTAZIDIME Value in next row Sensitive      RESISTANT>=128    CEFAZOLIN Value in next row Sensitive      RESISTANT>=128    CEFTRIAXONE Value in next row Sensitive      RESISTANT>=128    CIPROFLOXACIN Value in next row Sensitive      RESISTANT>=128    GENTAMICIN Value in next row Sensitive      RESISTANT>=128    IMIPENEM Value in next row Sensitive       RESISTANT>=128    TRIMETH/SULFA Value in next row Sensitive      RESISTANT>=128    PIP/TAZO Value in next row Sensitive      SENSITIVE<=4    * MODERATE GROWTH PROTEUS MIRABILIS   Enterococcus gallinarum - MIC*    AMPICILLIN Value in next row Resistant      SENSITIVE<=4    GENTAMICIN SYNERGY Value in next row Sensitive      SENSITIVE<=4    CIPROFLOXACIN Value in next row Resistant      RESISTANT>=8    TETRACYCLINE Value in next row Resistant      RESISTANT>=16    * MODERATE GROWTH ENTEROCOCCUS GALLINARUM  Wound culture     Status: None   Collection Time: 08/15/15  2:53 PM  Result Value Ref Range Status   Specimen Description WOUND  Final   Special Requests NONE  Final   Gram Stain FEW WBC SEEN NO ORGANISMS SEEN   Final   Culture NO GROWTH 3 DAYS  Final   Report Status 08/18/2015 FINAL  Final  C difficile quick scan w PCR reflex     Status: None   Collection Time: 08/17/15 11:34 AM  Result Value Ref Range Status   C Diff antigen NEGATIVE NEGATIVE Final   C Diff toxin NEGATIVE NEGATIVE Final   C Diff interpretation Negative for C. difficile  Final    Medications:  Scheduled:  . acidophilus  1 capsule Oral Daily  . amantadine  200 mg Oral Q7 days  . antiseptic oral rinse  7 mL Mouth Rinse q12n4p  . [START ON 08/29/2015] aspirin  324 mg Oral Daily  . budesonide (PULMICORT) nebulizer solution  0.5 mg Nebulization BID  . calcium gluconate  1 g Intravenous Once  . chlorhexidine  15 mL Mouth Rinse BID  . DAPTOmycin (CUBICIN)  IV  500 mg Intravenous Q48H  . epoetin (EPOGEN/PROCRIT) injection  10,000 Units Intravenous Once  . feeding supplement (PRO-STAT SUGAR FREE 64)  30 mL Oral 6 times per day  . free water  100 mL Per Tube 3 times per day  . hydrocortisone sod succinate (SOLU-CORTEF) inj  25 mg Intravenous Q8H  . insulin aspart  0-15 Units Subcutaneous 6 times per day  . ipratropium-albuterol  3 mL Nebulization BID  . lidocaine  1 patch Transdermal Q24H  . magnesium  sulfate 1 - 4 g bolus IVPB  2 g Intravenous Once  . meropenem (MERREM) IV  500 mg Intravenous q1800  . midodrine  10 mg Oral TID WC  . multivitamin  5 mL Per Tube Daily  . nystatin   Topical TID  . pantoprazole (PROTONIX) IV  40 mg Intravenous Q12H  . potassium chloride  20 mEq Oral BID  . potassium phosphate IVPB (mmol)  15 mmol Intravenous Once  . sodium hypochlorite   Irrigation BID   Infusions:  . feeding supplement (JEVITY 1.5 CAL/FIBER) 1,000 mL (08/27/15 1301)  . norepinephrine (LEVOPHED) Adult infusion Stopped (08/24/15 0755)  . pureflow 3 each (08/25/15 0826)   PRN: sodium chloride, sodium chloride, alteplase, anticoagulant sodium citrate, lidocaine (PF), lidocaine-prilocaine, loperamide, morphine injection, pentafluoroprop-tetrafluoroeth  Assessment: Pharmacy consulted to assist in dosing medications for CRRT in this 49 y/o F with ESRD currently unable to tolerate HD.   Plan:  Nephrology trying patient off CRRT now but it remains unclear if patient will tolerate HD. Will adjust amantadine to dialysis dosing of 200 mg q 7 days. All other medications are dosed appropriately for HD currently. Will continue to follow and adjust as necessary.   Luisa Hart D 08/28/2015,7:04 AM

## 2015-08-28 NOTE — Progress Notes (Signed)
ELECTROLYTE MANAGEMENT - FOLLOW UP   Pharmacy Consult for Electrolyte Management Indication: electrolyte management   Allergies  Allergen Reactions  . Contrast Media [Iodinated Diagnostic Agents] Anaphylaxis  . Ampicillin Rash    Patient Measurements: Height:  (175.3 cm) Weight: 182 lb 15.7 oz (83 kg) IBW/kg (Calculated) : 66.2   Vital Signs: Temp: 99.2 F (37.3 C) (10/09 0400) Temp Source: Oral (10/09 0000) BP: 156/52 mmHg (10/09 0500) Pulse Rate: 85 (10/09 0500) Intake/Output from previous day: 10/08 0701 - 10/09 0700 In: 750 [NG/GT:700; IV Piggyback:50] Out: -  Intake/Output from this shift: Total I/O In: 285 [NG/GT:285] Out: -   Labs:  Recent Labs  08/25/15 1616 08/26/15 0408 08/27/15 0410 08/28/15 0340  WBC  --  10.2 9.9  --   HGB 8.9* 9.2* 8.7*  --   HCT 26.7* 27.8* 26.5*  --   PLT  --  59* 62*  --   APTT  --  41* 32 30     Recent Labs  08/25/15 2103 08/26/15 0408 08/26/15 1144 08/27/15 0410 08/28/15 0340  NA 140 143  141 139 140 143  K 3.9 4.0  3.8 3.8 3.9 3.1*  CL 110 109  109 108 108 117*  CO2 GLUCOSE 75 79  79 75 88 89  BUN 21*  CREATININE 0.41* 0.44  0.47 0.47 0.65 0.74  CALCIUM 7.9* 8.2*  8.0* 7.8* 7.8* 6.3*  MG  --   --   --  1.6* 1.6*  PHOS 1.9* 1.7* 1.8* 2.7 2.6  ALBUMIN 2.1* 2.1* 2.0*  --   --    Estimated Creatinine Clearance: 96.8 mL/min (by C-G formula based on Cr of 0.74).    Recent Labs  08/27/15 1933 08/27/15 2329 08/28/15 0335  GLUCAP 102* 112* 97    Medical History: Past Medical History  Diagnosis Date  . Obesity   . Dyslipidemia   . Hypertension   . Coronary artery disease     s/p BMS 2010 LAD  . Dysrhythmia     ventricular tachycardia resolved after LAD stent and beta blocker  . Diabetes mellitus     with retinopathy, neuropathy and microalbuminemia  . ESRD (end stage renal disease) on dialysis     Medications:  Scheduled:  . acidophilus  1 capsule  Oral Daily  . amantadine  100 mg Oral BID  . antiseptic oral rinse  7 mL Mouth Rinse q12n4p  . [START ON 08/29/2015] aspirin  324 mg Oral Daily  . budesonide (PULMICORT) nebulizer solution  0.5 mg Nebulization BID  . calcium gluconate  1 g Intravenous Once  . chlorhexidine  15 mL Mouth Rinse BID  . DAPTOmycin (CUBICIN)  IV  500 mg Intravenous Q48H  . epoetin (EPOGEN/PROCRIT) injection  10,000 Units Intravenous Once  . feeding supplement (PRO-STAT SUGAR FREE 64)  30 mL Oral 6 times per day  . free water  100 mL Per Tube 3 times per day  . hydrocortisone sod succinate (SOLU-CORTEF) inj  25 mg Intravenous Q8H  . insulin aspart  0-15 Units Subcutaneous 6 times per day  . ipratropium-albuterol  3 mL Nebulization BID  . lidocaine  1 patch Transdermal Q24H  . magnesium sulfate 1 - 4 g bolus IVPB  2 g Intravenous Once  . meropenem (MERREM) IV  500 mg Intravenous q1800  . midodrine  10 mg Oral TID WC  . multivitamin  5 mL Per Tube  Daily  . nystatin   Topical TID  . pantoprazole (PROTONIX) IV  40 mg Intravenous Q12H  . potassium chloride  20 mEq Oral BID  . potassium phosphate IVPB (mmol)  15 mmol Intravenous Once  . sodium hypochlorite   Irrigation BID    Assessment: 49 yo female admitted for sepsis, found to have hypokalemia. Pharmacy consulted for electrolyte management.  CCa: 9.4  Plan:  Magnesium is low today at 1.6. Will replace with magnesium sulfate 2 g iv once and f/u am labs.    Pharmacy will continue to monitor and adjust per consult.   10/9 AM K+ 3.1, Ca 6.3 (corr. 7.9) and Mg 1.6. 40 mEq KCl, 1 gram calcium gluconate, and 2 grams magnesium sulfate ordered. Recheck in AM.   Audia Amick S, Pharm.D. Clinical Pharmacist 08/28/2015,6:27 AM

## 2015-08-28 NOTE — Progress Notes (Signed)
Subjective:   Husband at bedside.  Hemodialysis was not performed yesterday due to weather conditions.  Plan on hemodialysis later today.    Objective:  Vital signs in last 24 hours:  Temp:  [98.8 F (37.1 C)-99.7 F (37.6 C)] 99.2 F (37.3 C) (10/09 0400) Pulse Rate:  [76-108] 108 (10/09 0900) Resp:  [0-25] 25 (10/09 0900) BP: (107-167)/(52-104) 148/60 mmHg (10/09 0900) SpO2:  [87 %-100 %] 87 % (10/09 0900) FiO2 (%):  [30 %] 30 % (10/09 0824) Weight:  [83 kg (182 lb 15.7 oz)] 83 kg (182 lb 15.7 oz) (10/09 0500)  Weight change: -1 kg (-2 lb 3.3 oz) Filed Weights   08/26/15 0700 08/27/15 0416 08/28/15 0500  Weight: 83 kg (182 lb 15.7 oz) 84 kg (185 lb 3 oz) 83 kg (182 lb 15.7 oz)    Intake/Output: I/O last 3 completed shifts: In: 945 [NG/GT:895; IV Piggyback:50] Out: -    Intake/Output this shift:     Physical Exam: General: Critically ill  Head: Normocephalic, atraumatic. +NGT   Eyes: Eyes open, sluggish to light.   Neck: Trach in place, 100% O2, left IJ catheter  Lungs:  Scattered rhonchi, normal effort  Heart: regular  Abdomen:  Soft, nontender, BS present  Extremities:  +right upper extremity edema, trace edema of left upper extremity and dependent  Neurologic: Lethargic, opens eyes to verbal commands  Skin: +sacral decub in dressings  Access: R IJ permcath    Basic Metabolic Panel:  Recent Labs Lab 08/24/15 1648 08/24/15 2211 08/25/15 0426  08/25/15 2103 08/26/15 0408 08/26/15 1144 08/27/15 0410 08/28/15 0340  NA 130* 139 141  < > 140 143  141 139 140 143  K 6.6* 4.3 4.0  < > 3.9 4.0  3.8 3.8 3.9 3.1*  CL 99* 109 109  < > 110 109  109 108 108 117*  CO2 < > GLUCOSE 214* 74 76  < > 75 79  79 75 88 89  BUN < > 21*  CREATININE 0.54 0.46 0.41*  < > 0.41* 0.44  0.47 0.47 0.65 0.74  CALCIUM 7.4* 7.7* 7.9*  < > 7.9* 8.2*  8.0* 7.8* 7.8* 6.3*  MG 1.9 1.9 1.8  --   --   --   --  1.6* 1.6*   PHOS 8.6* 2.5 2.1*  2.1*  < > 1.9* 1.7* 1.8* 2.7 2.6  < > = values in this interval not displayed.  Liver Function Tests:  Recent Labs Lab 08/22/15 0539  08/25/15 1019 08/25/15 1608 08/25/15 2103 08/26/15 0408 08/26/15 1144  AST 22  --   --   --   --   --   --   ALT 19  --   --   --   --   --   --   ALKPHOS 454*  --   --   --   --   --   --   BILITOT 0.6  --   --   --   --   --   --   PROT 4.6*  --   --   --   --   --   --   ALBUMIN 2.0*  < > 2.0* 2.1* 2.1* 2.1* 2.0*  < > = values in this interval not displayed. No results for input(s): LIPASE, AMYLASE in the last 168 hours. No results for  input(s): AMMONIA in the last 168 hours.  CBC:  Recent Labs Lab 08/24/15 0543 08/24/15 1648 08/25/15 0426 08/25/15 1616 08/26/15 0408 08/27/15 0410  WBC 9.4 8.2 7.0  --  10.2 9.9  NEUTROABS  --  7.0*  --   --   --   --   HGB 8.3* 8.0* 6.8* 8.9* 9.2* 8.7*  HCT 24.6* 23.7* 20.4* 26.7* 27.8* 26.5*  MCV 85.0 86.1 86.4  --  87.8 89.8  PLT 87* 76* 76*  --  59* 62*    Cardiac Enzymes: No results for input(s): CKTOTAL, CKMB, CKMBINDEX, TROPONINI in the last 168 hours.  BNP: Invalid input(s): POCBNP  CBG:  Recent Labs Lab 08/27/15 1601 08/27/15 1933 08/27/15 2329 08/28/15 0335 08/28/15 0713  GLUCAP 127* 102* 112* 97 99    Microbiology: Results for orders placed or performed during the hospital encounter of 07/23/2015  Blood Culture (routine x 2)     Status: None   Collection Time: 07/27/2015  8:51 AM  Result Value Ref Range Status   Specimen Description BLOOD [name]  Final   Special Requests BOTTLES DRAWN AEROBIC AND ANAEROBIC  3CC  Final   Culture NO GROWTH 5 DAYS  Final   Report Status 08/17/2015 FINAL  Final  Blood Culture (routine x 2)     Status: None   Collection Time: 07/23/2015  9:20 AM  Result Value Ref Range Status   Specimen Description BLOOD LEFT ARM  Final   Special Requests BOTTLES DRAWN AEROBIC AND ANAEROBIC  1CC  Final   Culture NO GROWTH 5 DAYS  Final    Report Status 08/17/2015 FINAL  Final  Wound culture     Status: None   Collection Time: 08/03/2015  9:20 AM  Result Value Ref Range Status   Specimen Description DECUBITIS  Final   Special Requests Normal  Final   Gram Stain   Final    FEW WBC SEEN MANY GRAM NEGATIVE RODS RARE GRAM POSITIVE COCCI    Culture   Final    HEAVY GROWTH ESCHERICHIA COLI MODERATE GROWTH ENTEROBACTER AEROGENES PROTEUS MIRABILIS HEAVY GROWTH ENTEROCOCCUS SPECIES VRE HAVE INTRINSIC RESISTANCE TO MOST COMMONLY USED ANTIBIOTICS AND THE ABILITY TO ACQUIRE RESISTANCE TO MOST AVAILABLE ANTIBIOTICS.    Report Status 08/16/2015 FINAL  Final   Organism ID, Bacteria ESCHERICHIA COLI  Final   Organism ID, Bacteria ENTEROBACTER AEROGENES  Final   Organism ID, Bacteria PROTEUS MIRABILIS  Final   Organism ID, Bacteria ENTEROCOCCUS SPECIES  Final      Susceptibility   Enterobacter aerogenes - MIC*    CEFTAZIDIME <=1 SENSITIVE Sensitive     CEFAZOLIN >=64 RESISTANT Resistant     CEFTRIAXONE <=1 SENSITIVE Sensitive     CIPROFLOXACIN <=0.25 SENSITIVE Sensitive     GENTAMICIN <=1 SENSITIVE Sensitive     IMIPENEM 1 SENSITIVE Sensitive     TRIMETH/SULFA <=20 SENSITIVE Sensitive     * MODERATE GROWTH ENTEROBACTER AEROGENES   Escherichia coli - MIC*    AMPICILLIN <=2 SENSITIVE Sensitive     CEFTAZIDIME <=1 SENSITIVE Sensitive     CEFAZOLIN <=4 SENSITIVE Sensitive     CEFTRIAXONE <=1 SENSITIVE Sensitive     CIPROFLOXACIN <=0.25 SENSITIVE Sensitive     GENTAMICIN <=1 SENSITIVE Sensitive     IMIPENEM <=0.25 SENSITIVE Sensitive     TRIMETH/SULFA <=20 SENSITIVE Sensitive     * HEAVY GROWTH ESCHERICHIA COLI   Proteus mirabilis - MIC*    AMPICILLIN >=32 RESISTANT Resistant     CEFTAZIDIME <=1  SENSITIVE Sensitive     CEFAZOLIN 8 SENSITIVE Sensitive     CEFTRIAXONE <=1 SENSITIVE Sensitive     CIPROFLOXACIN <=0.25 SENSITIVE Sensitive     GENTAMICIN <=1 SENSITIVE Sensitive     IMIPENEM 1 SENSITIVE Sensitive     TRIMETH/SULFA  <=20 SENSITIVE Sensitive     * PROTEUS MIRABILIS   Enterococcus species - MIC*    AMPICILLIN >=32 RESISTANT Resistant     VANCOMYCIN >=32 RESISTANT Resistant     GENTAMICIN SYNERGY SENSITIVE Sensitive     TETRACYCLINE Value in next row Resistant      RESISTANT>=16    * HEAVY GROWTH ENTEROCOCCUS SPECIES  MRSA PCR Screening     Status: None   Collection Time: 08/18/2015  2:38 PM  Result Value Ref Range Status   MRSA by PCR NEGATIVE NEGATIVE Final    Comment:        The GeneXpert MRSA Assay (FDA approved for NASAL specimens only), is one component of a comprehensive MRSA colonization surveillance program. It is not intended to diagnose MRSA infection nor to guide or monitor treatment for MRSA infections.   Blood culture (single)     Status: None   Collection Time: 08/18/2015  3:36 PM  Result Value Ref Range Status   Specimen Description BLOOD RIGHT ASSIST CONTROL  Final   Special Requests BOTTLES DRAWN AEROBIC AND ANAEROBIC  Final   Culture NO GROWTH 5 DAYS  Final   Report Status 08/17/2015 FINAL  Final  Wound culture     Status: None   Collection Time: 08/13/15 12:37 PM  Result Value Ref Range Status   Specimen Description WOUND  Final   Special Requests Normal  Final   Gram Stain   Final    FEW WBC SEEN TOO NUMEROUS TO COUNT GRAM NEGATIVE RODS FEW GRAM POSITIVE COCCI    Culture   Final    HEAVY GROWTH ESCHERICHIA COLI MODERATE GROWTH PROTEUS MIRABILIS LIGHT GROWTH KLEBSIELLA PNEUMONIAE MODERATE GROWTH ENTEROCOCCUS GALLINARUM CRITICAL RESULT CALLED TO, READ BACK BY AND VERIFIED WITH: [facility] BORBA AT 1042 08/16/15 DV    Report Status 08/17/2015 FINAL  Final   Organism ID, Bacteria ESCHERICHIA COLI  Final   Organism ID, Bacteria PROTEUS MIRABILIS  Final   Organism ID, Bacteria KLEBSIELLA PNEUMONIAE  Final   Organism ID, Bacteria ENTEROCOCCUS GALLINARUM  Final      Susceptibility   Escherichia coli - MIC*    AMPICILLIN >=32 RESISTANT Resistant     CEFTAZIDIME 4  RESISTANT Resistant     CEFAZOLIN >=64 RESISTANT Resistant     CEFTRIAXONE 16 RESISTANT Resistant     GENTAMICIN 2 SENSITIVE Sensitive     IMIPENEM >=16 RESISTANT Resistant     TRIMETH/SULFA <=20 SENSITIVE Sensitive     Extended ESBL POSITIVE Resistant     PIP/TAZO Value in next row Resistant      RESISTANT>=128    CIPROFLOXACIN Value in next row Sensitive      SENSITIVE<=0.25    * HEAVY GROWTH ESCHERICHIA COLI   Klebsiella pneumoniae - MIC*    AMPICILLIN Value in next row Resistant      SENSITIVE<=0.25    CEFTAZIDIME Value in next row Resistant      SENSITIVE<=0.25    CEFAZOLIN Value in next row Resistant      SENSITIVE<=0.25    CEFTRIAXONE Value in next row Resistant      SENSITIVE<=0.25    CIPROFLOXACIN Value in next row Resistant      SENSITIVE<=0.25  GENTAMICIN Value in next row Sensitive      SENSITIVE<=0.25    IMIPENEM Value in next row Resistant      SENSITIVE<=0.25    TRIMETH/SULFA Value in next row Resistant      SENSITIVE<=0.25    PIP/TAZO Value in next row Resistant      RESISTANT>=128    * LIGHT GROWTH KLEBSIELLA PNEUMONIAE   Proteus mirabilis - MIC*    AMPICILLIN Value in next row Resistant      RESISTANT>=128    CEFTAZIDIME Value in next row Sensitive      RESISTANT>=128    CEFAZOLIN Value in next row Sensitive      RESISTANT>=128    CEFTRIAXONE Value in next row Sensitive      RESISTANT>=128    CIPROFLOXACIN Value in next row Sensitive      RESISTANT>=128    GENTAMICIN Value in next row Sensitive      RESISTANT>=128    IMIPENEM Value in next row Sensitive      RESISTANT>=128    TRIMETH/SULFA Value in next row Sensitive      RESISTANT>=128    PIP/TAZO Value in next row Sensitive      SENSITIVE<=4    * MODERATE GROWTH PROTEUS MIRABILIS   Enterococcus gallinarum - MIC*    AMPICILLIN Value in next row Resistant      SENSITIVE<=4    GENTAMICIN SYNERGY Value in next row Sensitive      SENSITIVE<=4    CIPROFLOXACIN Value in next row Resistant       RESISTANT>=8    TETRACYCLINE Value in next row Resistant      RESISTANT>=16    * MODERATE GROWTH ENTEROCOCCUS GALLINARUM  Wound culture     Status: None   Collection Time: 08/15/15  2:53 PM  Result Value Ref Range Status   Specimen Description WOUND  Final   Special Requests NONE  Final   Gram Stain FEW WBC SEEN NO ORGANISMS SEEN   Final   Culture NO GROWTH 3 DAYS  Final   Report Status 08/18/2015 FINAL  Final  C difficile quick scan w PCR reflex     Status: None   Collection Time: 08/17/15 11:34 AM  Result Value Ref Range Status   C Diff antigen NEGATIVE NEGATIVE Final   C Diff toxin NEGATIVE NEGATIVE Final   C Diff interpretation Negative for C. difficile  Final    Coagulation Studies: No results for input(s): LABPROT, INR in the last 72 hours.  Urinalysis: No results for input(s): COLORURINE, LABSPEC, PHURINE, GLUCOSEU, HGBUR, BILIRUBINUR, KETONESUR, PROTEINUR, UROBILINOGEN, NITRITE, LEUKOCYTESUR in the last 72 hours.  Invalid input(s): APPERANCEUR    Imaging: No results found.   Medications:   . feeding supplement (JEVITY 1.5 CAL/FIBER) 1,000 mL (08/27/15 1301)  . norepinephrine (LEVOPHED) Adult infusion Stopped (08/24/15 0755)  . pureflow 3 each (08/25/15 0826)   . acidophilus  1 capsule Oral Daily  . amantadine  200 mg Oral Q7 days  . antiseptic oral rinse  7 mL Mouth Rinse q12n4p  . [START ON 08/29/2015] aspirin  324 mg Oral Daily  . budesonide (PULMICORT) nebulizer solution  0.5 mg Nebulization BID  . calcium gluconate  1 g Intravenous Once  . chlorhexidine  15 mL Mouth Rinse BID  . DAPTOmycin (CUBICIN)  IV  500 mg Intravenous Q48H  . epoetin (EPOGEN/PROCRIT) injection  10,000 Units Intravenous Once  . feeding supplement (PRO-STAT SUGAR FREE 64)  30 mL Oral 6 times per day  . free  water  100 mL Per Tube 3 times per day  . hydrocortisone sod succinate (SOLU-CORTEF) inj  25 mg Intravenous Q8H  . insulin aspart  0-15 Units Subcutaneous 6 times per day  .  ipratropium-albuterol  3 mL Nebulization BID  . lidocaine  1 patch Transdermal Q24H  . meropenem (MERREM) IV  500 mg Intravenous q1800  . midodrine  10 mg Oral TID WC  . multivitamin  5 mL Per Tube Daily  . nystatin   Topical TID  . pantoprazole (PROTONIX) IV  40 mg Intravenous Q12H  . potassium chloride  20 mEq Oral BID  . potassium phosphate IVPB (mmol)  15 mmol Intravenous Once  . sodium hypochlorite   Irrigation BID   sodium chloride, sodium chloride, alteplase, anticoagulant sodium citrate, lidocaine (PF), lidocaine-prilocaine, loperamide, morphine injection, pentafluoroprop-tetrafluoroeth  Assessment/ Plan:  49 y.o. 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  1. End-stage renal disease on hemodialysis on HD TTHS. The patient has been on dialysis since October of 2014. R IJ permcath.  - Taken off CVVHD. Will attempt treatment of intermittent hemodialysis. Uf goal of 1 litre. If patient tolerates this, plan to resume TTS schedule.   2. Anemia of CKD: status post PRBC transfusions. - Will give epo with hemodialysis treatment IV 10,000 units  3. Secondary hyperparathyroidism: PTH low at 55. -  phos at goal. Completed IV replacement yesterday.   - low due to poor PO intake, loss of muscle mass and CRRT clearance. Will need to monitor.   4. Hypotension: off vassopressors.  - continue solucortef. Now off midodrine.  - Continue empiric antibiotics for sepsis with  daptomycin and meropenem.    LOS: 16 Slyvester Latona 10/9/20169:40 AM

## 2015-08-28 NOTE — Progress Notes (Signed)
NEUROLOGY NOTE  S: More somnolent today per chart  ROS unobtainable to aphasia  O: 98.8 155/76 82 18 No distress, overweight Normocephalic, oropharynx clear Supple, no JVD CTA B, no wheezing tachycardic, no murmur Diffuse edema, no C/C  Sleepy, opens to pain but does not follow Pupils 4mm and sluggish, weak R corneal, good L corneal, R droop Localizes on L, trace movement on R  A/P: 1. 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 This will still cause permanent deficits of R hemiparesis and aphasia which will not likely get better; Pt has problems following still 2. Encephalopathy-worsened now but can fluctuate as expect, may have to do with missed dialysis yesterday - Continue amantadine  BID PO - Still await Hem/Onc consult to determine etiology of low platelets and help to determine if there is some underlying hypercoaguable disorder which led to stroke - Still needs TEE when stable but now pt cant be anticoagulated anyhow - Will need hypercoaguable w/u if TEE is neg - Would start ASA as soon as possible from medical standpoint - Try to keep Hgb > 8 - Keep MAP > 60 for good perfusion - Will still follow with you

## 2015-08-28 NOTE — Progress Notes (Signed)
#  6 shiley trach secure. bbs equal. Trach suctioned for small amount of white secretions without difficulty. Patient tolerated well. Vitals stable during procedure

## 2015-08-28 NOTE — Progress Notes (Signed)
Dr. Sloan Leiter notified of Ca level and Mg level.  No orders at this time.

## 2015-08-28 NOTE — Progress Notes (Signed)
PT Cancellation Note  Patient Details Name: Courtney [name] MRN: [id number] DOB: 27-Dec-1965   Cancelled Treatment:    Reason Eval/Treat Not Completed: Fatigue/lethargy limiting ability to participate (was having dialysis)  Attempted to see pt this afternoon, pt was soundly sleeping and had recently started dialysis.  Family present and agreed it would be better to hold until tomorrow.  Loran Senters, PT, DPT [phone]  Malachi Pro 08/28/2015, 3:53 PM

## 2015-08-28 NOTE — Progress Notes (Signed)
Pharmacy Consult for Daptomycin/Meropenem Indication: Sepsis/Sacral decubitus  Allergies  Allergen Reactions  . Contrast Media [Iodinated Diagnostic Agents] Anaphylaxis  . Ampicillin Rash    Patient Measurements: Height: 5\' 9"  (175.3 cm) Weight: 182 lb 15.7 oz (83 kg) IBW/kg (Calculated) : 66.2   Vital Signs: Temp: 99.2 F (37.3 C) (10/09 0400) Temp Source: Oral (10/09 0000) BP: 155/64 mmHg (10/09 0600) Pulse Rate: 82 (10/09 0600) Intake/Output from previous day: 10/08 0701 - 10/09 0700 In: 765 [NG/GT:715; IV Piggyback:50] Out: -  Intake/Output from this shift: Total I/O In: 300 [NG/GT:300] Out: -   Labs:  Recent Labs  08/25/15 1616  08/26/15 0408 08/26/15 1144 08/27/15 0410 08/28/15 0340  WBC  --   --  10.2  --  9.9  --   HGB 8.9*  --  9.2*  --  8.7*  --   PLT  --   --  59*  --  62*  --   CREATININE  --   < > 0.44  0.47 0.47 0.65 0.74  < > = values in this interval not displayed. Estimated Creatinine Clearance: 96.8 mL/min (by C-G formula based on Cr of 0.74). No results for input(s): VANCOTROUGH, VANCOPEAK, VANCORANDOM, GENTTROUGH, GENTPEAK, GENTRANDOM, TOBRATROUGH, TOBRAPEAK, TOBRARND, AMIKACINPEAK, AMIKACINTROU, AMIKACIN in the last 72 hours.   Microbiology: Recent Results (from the past 720 hour(s))  Culture, blood (routine x 2)     Status: None   Collection Time: 08/01/15  7:00 PM  Result Value Ref Range Status   Specimen Description BLOOD RIGHT ANTECUBITAL  Final   Special Requests BOTTLES DRAWN AEROBIC ONLY 5CC  Final   Culture NO GROWTH 5 DAYS  Final   Report Status 08/06/2015 FINAL  Final  Culture, blood (routine x 2)     Status: None   Collection Time: 08/01/15  7:10 PM  Result Value Ref Range Status   Specimen Description BLOOD RIGHT HAND  Final   Special Requests IN PEDIATRIC BOTTLE 3CC  Final   Culture  Setup Time   Final    GRAM POSITIVE COCCI IN CLUSTERS AEROBIC BOTTLE ONLY CRITICAL RESULT CALLED TO, READ BACK BY AND VERIFIED WITH: D  TAYLOR,RN AT 1115 08/02/15 BY L BENFIELD    Culture STAPHYLOCOCCUS SPECIES (COAGULASE NEGATIVE)  Final   Report Status 08/06/2015 FINAL  Final   Organism ID, Bacteria STAPHYLOCOCCUS SPECIES (COAGULASE NEGATIVE)  Final      Susceptibility   Staphylococcus species (coagulase negative) - MIC*    CIPROFLOXACIN >=8 RESISTANT Resistant     ERYTHROMYCIN <=0.25 SENSITIVE Sensitive     GENTAMICIN 8 INTERMEDIATE Intermediate     OXACILLIN >=4 RESISTANT Resistant     TETRACYCLINE <=1 SENSITIVE Sensitive     VANCOMYCIN 1 SENSITIVE Sensitive     TRIMETH/SULFA 160 RESISTANT Resistant     CLINDAMYCIN <=0.25 SENSITIVE Sensitive     RIFAMPIN <=0.5 SENSITIVE Sensitive     Inducible Clindamycin NEGATIVE Sensitive     * STAPHYLOCOCCUS SPECIES (COAGULASE NEGATIVE)  Culture, blood (routine x 2)     Status: None   Collection Time: 08/02/15  7:00 PM  Result Value Ref Range Status   Specimen Description BLOOD RIGHT ANTECUBITAL  Final   Special Requests BOTTLES DRAWN AEROBIC ONLY 10CC  Final   Culture  Setup Time   Final    GRAM POSITIVE COCCI IN CLUSTERS AEROBIC BOTTLE ONLY CRITICAL RESULT CALLED TO, READ BACK BY AND VERIFIED WITH: DR Gwenevere Abbot RN 1549 08/03/15 A BROWNING    Culture   Final  STAPHYLOCOCCUS SPECIES (COAGULASE NEGATIVE) SUSCEPTIBILITIES PERFORMED ON PREVIOUS CULTURE WITHIN THE LAST 5 DAYS.    Report Status 08/06/2015 FINAL  Final  Culture, blood (routine x 2)     Status: None   Collection Time: 08/02/15  7:05 PM  Result Value Ref Range Status   Specimen Description BLOOD RIGHT HAND  Final   Special Requests BOTTLES DRAWN AEROBIC AND ANAEROBIC 10CC  Final   Culture NO GROWTH 5 DAYS  Final   Report Status 08/07/2015 FINAL  Final  Blood Culture (routine x 2)     Status: None   Collection Time: Aug 15, 2015  8:51 AM  Result Value Ref Range Status   Specimen Description BLOOD UNKO  Final   Special Requests BOTTLES DRAWN AEROBIC AND ANAEROBIC  3CC  Final   Culture NO GROWTH 5 DAYS  Final    Report Status 08/17/2015 FINAL  Final  Blood Culture (routine x 2)     Status: None   Collection Time: August 15, 2015  9:20 AM  Result Value Ref Range Status   Specimen Description BLOOD LEFT ARM  Final   Special Requests BOTTLES DRAWN AEROBIC AND ANAEROBIC  1CC  Final   Culture NO GROWTH 5 DAYS  Final   Report Status 08/17/2015 FINAL  Final  Wound culture     Status: None   Collection Time: 08-15-2015  9:20 AM  Result Value Ref Range Status   Specimen Description DECUBITIS  Final   Special Requests Normal  Final   Gram Stain   Final    FEW WBC SEEN MANY GRAM NEGATIVE RODS RARE GRAM POSITIVE COCCI    Culture   Final    HEAVY GROWTH ESCHERICHIA COLI MODERATE GROWTH ENTEROBACTER AEROGENES PROTEUS MIRABILIS HEAVY GROWTH ENTEROCOCCUS SPECIES VRE HAVE INTRINSIC RESISTANCE TO MOST COMMONLY USED ANTIBIOTICS AND THE ABILITY TO ACQUIRE RESISTANCE TO MOST AVAILABLE ANTIBIOTICS.    Report Status 08/16/2015 FINAL  Final   Organism ID, Bacteria ESCHERICHIA COLI  Final   Organism ID, Bacteria ENTEROBACTER AEROGENES  Final   Organism ID, Bacteria PROTEUS MIRABILIS  Final   Organism ID, Bacteria ENTEROCOCCUS SPECIES  Final      Susceptibility   Enterobacter aerogenes - MIC*    CEFTAZIDIME <=1 SENSITIVE Sensitive     CEFAZOLIN >=64 RESISTANT Resistant     CEFTRIAXONE <=1 SENSITIVE Sensitive     CIPROFLOXACIN <=0.25 SENSITIVE Sensitive     GENTAMICIN <=1 SENSITIVE Sensitive     IMIPENEM 1 SENSITIVE Sensitive     TRIMETH/SULFA <=20 SENSITIVE Sensitive     * MODERATE GROWTH ENTEROBACTER AEROGENES   Escherichia coli - MIC*    AMPICILLIN <=2 SENSITIVE Sensitive     CEFTAZIDIME <=1 SENSITIVE Sensitive     CEFAZOLIN <=4 SENSITIVE Sensitive     CEFTRIAXONE <=1 SENSITIVE Sensitive     CIPROFLOXACIN <=0.25 SENSITIVE Sensitive     GENTAMICIN <=1 SENSITIVE Sensitive     IMIPENEM <=0.25 SENSITIVE Sensitive     TRIMETH/SULFA <=20 SENSITIVE Sensitive     * HEAVY GROWTH ESCHERICHIA COLI   Proteus mirabilis  - MIC*    AMPICILLIN >=32 RESISTANT Resistant     CEFTAZIDIME <=1 SENSITIVE Sensitive     CEFAZOLIN 8 SENSITIVE Sensitive     CEFTRIAXONE <=1 SENSITIVE Sensitive     CIPROFLOXACIN <=0.25 SENSITIVE Sensitive     GENTAMICIN <=1 SENSITIVE Sensitive     IMIPENEM 1 SENSITIVE Sensitive     TRIMETH/SULFA <=20 SENSITIVE Sensitive     * PROTEUS MIRABILIS   Enterococcus species - MIC*  AMPICILLIN >=32 RESISTANT Resistant     VANCOMYCIN >=32 RESISTANT Resistant     GENTAMICIN SYNERGY SENSITIVE Sensitive     TETRACYCLINE Value in next row Resistant      RESISTANT>=16    * HEAVY GROWTH ENTEROCOCCUS SPECIES  MRSA PCR Screening     Status: None   Collection Time: 07/24/2015  2:38 PM  Result Value Ref Range Status   MRSA by PCR NEGATIVE NEGATIVE Final    Comment:        The GeneXpert MRSA Assay (FDA approved for NASAL specimens only), is one component of a comprehensive MRSA colonization surveillance program. It is not intended to diagnose MRSA infection nor to guide or monitor treatment for MRSA infections.   Blood culture (single)     Status: None   Collection Time: 08/11/2015  3:36 PM  Result Value Ref Range Status   Specimen Description BLOOD RIGHT ASSIST CONTROL  Final   Special Requests BOTTLES DRAWN AEROBIC AND ANAEROBIC  Final   Culture NO GROWTH 5 DAYS  Final   Report Status 08/17/2015 FINAL  Final  Wound culture     Status: None   Collection Time: 08/13/15 12:37 PM  Result Value Ref Range Status   Specimen Description WOUND  Final   Special Requests Normal  Final   Gram Stain   Final    FEW WBC SEEN TOO NUMEROUS TO COUNT GRAM NEGATIVE RODS FEW GRAM POSITIVE COCCI    Culture   Final    HEAVY GROWTH ESCHERICHIA COLI MODERATE GROWTH PROTEUS MIRABILIS LIGHT GROWTH KLEBSIELLA PNEUMONIAE MODERATE GROWTH ENTEROCOCCUS GALLINARUM CRITICAL RESULT CALLED TO, READ BACK BY AND VERIFIED WITH: [facility] BORBA AT 1042 08/16/15 DV    Report Status 08/17/2015 FINAL  Final   Organism  ID, Bacteria ESCHERICHIA COLI  Final   Organism ID, Bacteria PROTEUS MIRABILIS  Final   Organism ID, Bacteria KLEBSIELLA PNEUMONIAE  Final   Organism ID, Bacteria ENTEROCOCCUS GALLINARUM  Final      Susceptibility   Escherichia coli - MIC*    AMPICILLIN >=32 RESISTANT Resistant     CEFTAZIDIME 4 RESISTANT Resistant     CEFAZOLIN >=64 RESISTANT Resistant     CEFTRIAXONE 16 RESISTANT Resistant     GENTAMICIN 2 SENSITIVE Sensitive     IMIPENEM >=16 RESISTANT Resistant     TRIMETH/SULFA <=20 SENSITIVE Sensitive     Extended ESBL POSITIVE Resistant     PIP/TAZO Value in next row Resistant      RESISTANT>=128    CIPROFLOXACIN Value in next row Sensitive      SENSITIVE<=0.25    * HEAVY GROWTH ESCHERICHIA COLI   Klebsiella pneumoniae - MIC*    AMPICILLIN Value in next row Resistant      SENSITIVE<=0.25    CEFTAZIDIME Value in next row Resistant      SENSITIVE<=0.25    CEFAZOLIN Value in next row Resistant      SENSITIVE<=0.25    CEFTRIAXONE Value in next row Resistant      SENSITIVE<=0.25    CIPROFLOXACIN Value in next row Resistant      SENSITIVE<=0.25    GENTAMICIN Value in next row Sensitive      SENSITIVE<=0.25    IMIPENEM Value in next row Resistant      SENSITIVE<=0.25    TRIMETH/SULFA Value in next row Resistant      SENSITIVE<=0.25    PIP/TAZO Value in next row Resistant      RESISTANT>=128    * LIGHT GROWTH KLEBSIELLA PNEUMONIAE  Proteus mirabilis - MIC*    AMPICILLIN Value in next row Resistant      RESISTANT>=128    CEFTAZIDIME Value in next row Sensitive      RESISTANT>=128    CEFAZOLIN Value in next row Sensitive      RESISTANT>=128    CEFTRIAXONE Value in next row Sensitive      RESISTANT>=128    CIPROFLOXACIN Value in next row Sensitive      RESISTANT>=128    GENTAMICIN Value in next row Sensitive      RESISTANT>=128    IMIPENEM Value in next row Sensitive      RESISTANT>=128    TRIMETH/SULFA Value in next row Sensitive      RESISTANT>=128    PIP/TAZO  Value in next row Sensitive      SENSITIVE<=4    * MODERATE GROWTH PROTEUS MIRABILIS   Enterococcus gallinarum - MIC*    AMPICILLIN Value in next row Resistant      SENSITIVE<=4    GENTAMICIN SYNERGY Value in next row Sensitive      SENSITIVE<=4    CIPROFLOXACIN Value in next row Resistant      RESISTANT>=8    TETRACYCLINE Value in next row Resistant      RESISTANT>=16    * MODERATE GROWTH ENTEROCOCCUS GALLINARUM  Wound culture     Status: None   Collection Time: 08/15/15  2:53 PM  Result Value Ref Range Status   Specimen Description WOUND  Final   Special Requests NONE  Final   Gram Stain FEW WBC SEEN NO ORGANISMS SEEN   Final   Culture NO GROWTH 3 DAYS  Final   Report Status 08/18/2015 FINAL  Final  C difficile quick scan w PCR reflex     Status: None   Collection Time: 08/17/15 11:34 AM  Result Value Ref Range Status   C Diff antigen NEGATIVE NEGATIVE Final   C Diff toxin NEGATIVE NEGATIVE Final   C Diff interpretation Negative for C. difficile  Final    Medical History: Past Medical History  Diagnosis Date  . Obesity   . Dyslipidemia   . Hypertension   . Coronary artery disease     s/p BMS 2010 LAD  . Dysrhythmia     ventricular tachycardia resolved after LAD stent and beta blocker  . Diabetes mellitus     with retinopathy, neuropathy and microalbuminemia  . ESRD (end stage renal disease) on dialysis     Medications:  Scheduled:  . acidophilus  1 capsule Oral Daily  . amantadine  100 mg Oral BID  . antiseptic oral rinse  7 mL Mouth Rinse q12n4p  . [START ON 08/29/2015] aspirin  324 mg Oral Daily  . budesonide (PULMICORT) nebulizer solution  0.5 mg Nebulization BID  . calcium gluconate  1 g Intravenous Once  . chlorhexidine  15 mL Mouth Rinse BID  . DAPTOmycin (CUBICIN)  IV  500 mg Intravenous Q48H  . epoetin (EPOGEN/PROCRIT) injection  10,000 Units Intravenous Once  . feeding supplement (PRO-STAT SUGAR FREE 64)  30 mL Oral 6 times per day  . free water   100 mL Per Tube 3 times per day  . hydrocortisone sod succinate (SOLU-CORTEF) inj  25 mg Intravenous Q8H  . insulin aspart  0-15 Units Subcutaneous 6 times per day  . ipratropium-albuterol  3 mL Nebulization BID  . lidocaine  1 patch Transdermal Q24H  . magnesium sulfate 1 - 4 g bolus IVPB  2 g Intravenous Once  .  meropenem (MERREM) IV  500 mg Intravenous q1800  . midodrine  10 mg Oral TID WC  . multivitamin  5 mL Per Tube Daily  . nystatin   Topical TID  . pantoprazole (PROTONIX) IV  40 mg Intravenous Q12H  . potassium chloride  20 mEq Oral BID  . potassium phosphate IVPB (mmol)  15 mmol Intravenous Once  . sodium hypochlorite   Irrigation BID   Infusions:  . feeding supplement (JEVITY 1.5 CAL/FIBER) 1,000 mL (08/27/15 1301)  . norepinephrine (LEVOPHED) Adult infusion Stopped (08/24/15 0755)  . 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 Patient is on day 16 of antibiotics (day 12 of daptomycin and day 16 of meropenem).  Goal of Therapy:  Resolution of infection  Plan:   Patient is now off CRRT so will revert to dialysis dosing of daptomycin 6 mg/kg q 48 hours and meropenem 500 mg iv q 24 hours. Will need to f/u and make sure patient tolerates HD. Pharmacy will continue to monitor and adjust per consult.    Courtney [name] D 08/28/2015,6:58 AM

## 2015-08-28 NOTE — Plan of Care (Signed)
Problem: ICU Phase Progression Outcomes Goal: O2 sats trending toward baseline Outcome: Progressing Sats good on 40 % trach collar.Secretions minimal and clear. Lungs clear and diminished Goal: Hemodynamically stable Outcome: Progressing Midodrine held x 2 doses today for increased bp per husband request. BP dipped down last hour dialysis.  Midodrine at 1800 Goal: Pain controlled with appropriate interventions Outcome: Progressing Tylenol for pain.  Rests comfortaby Goal: Initial discharge plan identified Outcome: Progressing Care Manager involved for SNF placement at discharge  Problem: Phase I Progression Outcomes Goal: Tolerating diet Outcome: Progressing Tolerating trickle tube feed at 15ml /hr.  2 loose brown stools today. C diff negative this admissio. Dr Belia Heman aware.

## 2015-08-29 DIAGNOSIS — Z930 Tracheostomy status: Secondary | ICD-10-CM

## 2015-08-29 DIAGNOSIS — R6520 Severe sepsis without septic shock: Secondary | ICD-10-CM

## 2015-08-29 LAB — GLUCOSE, CAPILLARY
GLUCOSE-CAPILLARY: 100 mg/dL — AB (ref 65–99)
GLUCOSE-CAPILLARY: 116 mg/dL — AB (ref 65–99)
GLUCOSE-CAPILLARY: 116 mg/dL — AB (ref 65–99)
GLUCOSE-CAPILLARY: 148 mg/dL — AB (ref 65–99)
GLUCOSE-CAPILLARY: 94 mg/dL (ref 65–99)
Glucose-Capillary: 114 mg/dL — ABNORMAL HIGH (ref 65–99)

## 2015-08-29 LAB — BASIC METABOLIC PANEL
ANION GAP: 4 — AB (ref 5–15)
BUN: 23 mg/dL — ABNORMAL HIGH (ref 6–20)
CHLORIDE: 105 mmol/L (ref 101–111)
CO2: 30 mmol/L (ref 22–32)
Calcium: 7.9 mg/dL — ABNORMAL LOW (ref 8.9–10.3)
Creatinine, Ser: 0.81 mg/dL (ref 0.44–1.00)
GFR calc Af Amer: >60 mL/min (ref >60)
GFR calc non Af Amer: >60 mL/min (ref >60)
GLUCOSE: 89 mg/dL (ref 65–99)
POTASSIUM: 3.7 mmol/L (ref 3.5–5.1)
Sodium: 139 mmol/L (ref 135–145)

## 2015-08-29 LAB — MAGNESIUM: Magnesium: 1.9 mg/dL (ref 1.7–2.4)

## 2015-08-29 LAB — PHOSPHORUS: Phosphorus: 2.4 mg/dL — ABNORMAL LOW (ref 2.5–4.6)

## 2015-08-29 LAB — APTT: aPTT: 31 seconds (ref 24–36)

## 2015-08-29 MED ORDER — PANTOPRAZOLE SODIUM 40 MG PO PACK
40.0000 mg | PACK | Freq: Every day | ORAL | Status: DC
  Administered 2015-08-29 – 2015-09-15 (×18): 40 mg
  Filled 2015-08-29 (×18): qty 20

## 2015-08-29 MED ORDER — JEVITY 1.5 CAL/FIBER PO LIQD
1000.0000 mL | ORAL | Status: DC
  Administered 2015-08-29: 1000 mL

## 2015-08-29 MED ORDER — EPOETIN ALFA 10000 UNIT/ML IJ SOLN
10000.0000 [IU] | INTRAMUSCULAR | Status: DC
  Administered 2015-09-01 – 2015-09-05 (×3): 10000 [IU] via INTRAVENOUS

## 2015-08-29 MED ORDER — K PHOS MONO-SOD PHOS DI & MONO 155-852-130 MG PO TABS
250.0000 mg | ORAL_TABLET | Freq: Two times a day (BID) | ORAL | Status: AC
  Administered 2015-08-29 (×2): 250 mg via ORAL
  Filled 2015-08-29 (×3): qty 1

## 2015-08-29 MED ORDER — HYDROCORTISONE 0.5 MG/ML ORAL SUSPENSION
10.0000 mg | Freq: Two times a day (BID) | ORAL | Status: DC
  Filled 2015-08-29 (×2): qty 20

## 2015-08-29 MED ORDER — HYDROCORTISONE 10 MG PO TABS
10.0000 mg | ORAL_TABLET | Freq: Two times a day (BID) | ORAL | Status: DC
  Administered 2015-08-29 – 2015-12-06 (×198): 10 mg
  Filled 2015-08-29 (×201): qty 1

## 2015-08-29 NOTE — Progress Notes (Signed)
MEDICATION RELATED CONSULT NOTE - FOLLOW UP   Pharmacy Consult for Meropenem/Daptomycin Indication: Sepsis/Sacral decubitus   Allergies  Allergen Reactions  . Contrast Media [Iodinated Diagnostic Agents] Anaphylaxis  . Ampicillin Rash    Patient Measurements: Height:  (175.3 cm) Weight: 185 lb 13.6 oz (84.3 kg) IBW/kg (Calculated) : 66.2   Vital Signs: Temp: 98.5 F (36.9 C) (10/10 0800) Temp Source: Oral (10/10 0800) BP: 141/76 mmHg (10/10 1100) Pulse Rate: 108 (10/10 1100) Intake/Output from previous day: 10/09 0701 - 10/10 0700 In: 1180 [NG/GT:860; IV Piggyback:320] Out: 478  Intake/Output from this shift: Total I/O In: 90 [NG/GT:90] Out: -   Labs:  Recent Labs  08/26/15 1144 08/27/15 0410 08/28/15 0340 08/28/15 1040 08/28/15 1442 08/29/15 0511  WBC  --  9.9  --  8.9 10.3  --   HGB  --  8.7*  --  9.3* 8.7*  --   HCT  --  26.5*  --  28.7* 26.9*  --   PLT  --  62*  --  79* 82*  --   APTT  --  32 30  --   --  31  CREATININE 0.47 0.65 0.74  --  1.13* 0.81  MG  --  1.6* 1.6*  --   --  1.9  PHOS 1.8* 2.7 2.6  --  3.6 2.4*  ALBUMIN 2.0*  --   --   --  1.9*  --    Estimated Creatinine Clearance: 96.3 mL/min (by C-G formula based on Cr of 0.81).   Microbiology: Recent Results (from the past 720 hour(s))  Culture, blood (routine x 2)     Status: None   Collection Time: 08/01/15  7:00 PM  Result Value Ref Range Status   Specimen Description BLOOD RIGHT ANTECUBITAL  Final   Special Requests BOTTLES DRAWN AEROBIC ONLY 5CC  Final   Culture NO GROWTH 5 DAYS  Final   Report Status 08/06/2015 FINAL  Final  Culture, blood (routine x 2)     Status: None   Collection Time: 08/01/15  7:10 PM  Result Value Ref Range Status   Specimen Description BLOOD RIGHT HAND  Final   Special Requests IN PEDIATRIC BOTTLE 3CC  Final   Culture  Setup Time   Final    GRAM POSITIVE COCCI IN CLUSTERS AEROBIC BOTTLE ONLY CRITICAL RESULT CALLED TO, READ BACK BY AND VERIFIED WITH:  D TAYLOR,RN AT 1115 08/02/15 BY L BENFIELD    Culture STAPHYLOCOCCUS SPECIES (COAGULASE NEGATIVE)  Final   Report Status 08/06/2015 FINAL  Final   Organism ID, Bacteria STAPHYLOCOCCUS SPECIES (COAGULASE NEGATIVE)  Final      Susceptibility   Staphylococcus species (coagulase negative) - MIC*    CIPROFLOXACIN >=8 RESISTANT Resistant     ERYTHROMYCIN <=0.25 SENSITIVE Sensitive     GENTAMICIN 8 INTERMEDIATE Intermediate     OXACILLIN >=4 RESISTANT Resistant     TETRACYCLINE <=1 SENSITIVE Sensitive     VANCOMYCIN 1 SENSITIVE Sensitive     TRIMETH/SULFA 160 RESISTANT Resistant     CLINDAMYCIN <=0.25 SENSITIVE Sensitive     RIFAMPIN <=0.5 SENSITIVE Sensitive     Inducible Clindamycin NEGATIVE Sensitive     * STAPHYLOCOCCUS SPECIES (COAGULASE NEGATIVE)  Culture, blood (routine x 2)     Status: None   Collection Time: 08/02/15  7:00 PM  Result Value Ref Range Status   Specimen Description BLOOD RIGHT ANTECUBITAL  Final   Special Requests BOTTLES DRAWN AEROBIC ONLY 10CC  Final   Culture  Setup Time   Final    GRAM POSITIVE COCCI IN CLUSTERS AEROBIC BOTTLE ONLY CRITICAL RESULT CALLED TO, READ BACK BY AND VERIFIED WITH: DR Gwenevere Abbot RN 1549 08/03/15 A BROWNING    Culture   Final    STAPHYLOCOCCUS SPECIES (COAGULASE NEGATIVE) SUSCEPTIBILITIES PERFORMED ON PREVIOUS CULTURE WITHIN THE LAST 5 DAYS.    Report Status 08/06/2015 FINAL  Final  Culture, blood (routine x 2)     Status: None   Collection Time: 08/02/15  7:05 PM  Result Value Ref Range Status   Specimen Description BLOOD RIGHT HAND  Final   Special Requests BOTTLES DRAWN AEROBIC AND ANAEROBIC 10CC  Final   Culture NO GROWTH 5 DAYS  Final   Report Status 08/07/2015 FINAL  Final  Blood Culture (routine x 2)     Status: None   Collection Time: 08/24/15  8:51 AM  Result Value Ref Range Status   Specimen Description BLOOD UNKO  Final   Special Requests BOTTLES DRAWN AEROBIC AND ANAEROBIC  3CC  Final   Culture NO GROWTH 5 DAYS  Final    Report Status 08/17/2015 FINAL  Final  Blood Culture (routine x 2)     Status: None   Collection Time: 24-Aug-2015  9:20 AM  Result Value Ref Range Status   Specimen Description BLOOD LEFT ARM  Final   Special Requests BOTTLES DRAWN AEROBIC AND ANAEROBIC  1CC  Final   Culture NO GROWTH 5 DAYS  Final   Report Status 08/17/2015 FINAL  Final  Wound culture     Status: None   Collection Time: August 24, 2015  9:20 AM  Result Value Ref Range Status   Specimen Description DECUBITIS  Final   Special Requests Normal  Final   Gram Stain   Final    FEW WBC SEEN MANY GRAM NEGATIVE RODS RARE GRAM POSITIVE COCCI    Culture   Final    HEAVY GROWTH ESCHERICHIA COLI MODERATE GROWTH ENTEROBACTER AEROGENES PROTEUS MIRABILIS HEAVY GROWTH ENTEROCOCCUS SPECIES VRE HAVE INTRINSIC RESISTANCE TO MOST COMMONLY USED ANTIBIOTICS AND THE ABILITY TO ACQUIRE RESISTANCE TO MOST AVAILABLE ANTIBIOTICS.    Report Status 08/16/2015 FINAL  Final   Organism ID, Bacteria ESCHERICHIA COLI  Final   Organism ID, Bacteria ENTEROBACTER AEROGENES  Final   Organism ID, Bacteria PROTEUS MIRABILIS  Final   Organism ID, Bacteria ENTEROCOCCUS SPECIES  Final      Susceptibility   Enterobacter aerogenes - MIC*    CEFTAZIDIME <=1 SENSITIVE Sensitive     CEFAZOLIN >=64 RESISTANT Resistant     CEFTRIAXONE <=1 SENSITIVE Sensitive     CIPROFLOXACIN <=0.25 SENSITIVE Sensitive     GENTAMICIN <=1 SENSITIVE Sensitive     IMIPENEM 1 SENSITIVE Sensitive     TRIMETH/SULFA <=20 SENSITIVE Sensitive     * MODERATE GROWTH ENTEROBACTER AEROGENES   Escherichia coli - MIC*    AMPICILLIN <=2 SENSITIVE Sensitive     CEFTAZIDIME <=1 SENSITIVE Sensitive     CEFAZOLIN <=4 SENSITIVE Sensitive     CEFTRIAXONE <=1 SENSITIVE Sensitive     CIPROFLOXACIN <=0.25 SENSITIVE Sensitive     GENTAMICIN <=1 SENSITIVE Sensitive     IMIPENEM <=0.25 SENSITIVE Sensitive     TRIMETH/SULFA <=20 SENSITIVE Sensitive     * HEAVY GROWTH ESCHERICHIA COLI   Proteus mirabilis  - MIC*    AMPICILLIN >=32 RESISTANT Resistant     CEFTAZIDIME <=1 SENSITIVE Sensitive     CEFAZOLIN 8 SENSITIVE Sensitive     CEFTRIAXONE <=1 SENSITIVE Sensitive  CIPROFLOXACIN <=0.25 SENSITIVE Sensitive     GENTAMICIN <=1 SENSITIVE Sensitive     IMIPENEM 1 SENSITIVE Sensitive     TRIMETH/SULFA <=20 SENSITIVE Sensitive     * PROTEUS MIRABILIS   Enterococcus species - MIC*    AMPICILLIN >=32 RESISTANT Resistant     VANCOMYCIN >=32 RESISTANT Resistant     GENTAMICIN SYNERGY SENSITIVE Sensitive     TETRACYCLINE Value in next row Resistant      RESISTANT>=16    * HEAVY GROWTH ENTEROCOCCUS SPECIES  MRSA PCR Screening     Status: None   Collection Time: 2015/08/13  2:38 PM  Result Value Ref Range Status   MRSA by PCR NEGATIVE NEGATIVE Final    Comment:        The GeneXpert MRSA Assay (FDA approved for NASAL specimens only), is one component of a comprehensive MRSA colonization surveillance program. It is not intended to diagnose MRSA infection nor to guide or monitor treatment for MRSA infections.   Blood culture (single)     Status: None   Collection Time: 2015-08-13  3:36 PM  Result Value Ref Range Status   Specimen Description BLOOD RIGHT ASSIST CONTROL  Final   Special Requests BOTTLES DRAWN AEROBIC AND ANAEROBIC  Final   Culture NO GROWTH 5 DAYS  Final   Report Status 08/17/2015 FINAL  Final  Wound culture     Status: None   Collection Time: 08/13/15 12:37 PM  Result Value Ref Range Status   Specimen Description WOUND  Final   Special Requests Normal  Final   Gram Stain   Final    FEW WBC SEEN TOO NUMEROUS TO COUNT GRAM NEGATIVE RODS FEW GRAM POSITIVE COCCI    Culture   Final    HEAVY GROWTH ESCHERICHIA COLI MODERATE GROWTH PROTEUS MIRABILIS LIGHT GROWTH KLEBSIELLA PNEUMONIAE MODERATE GROWTH ENTEROCOCCUS GALLINARUM CRITICAL RESULT CALLED TO, READ BACK BY AND VERIFIED WITH: [facility] BORBA AT 1042 08/16/15 DV    Report Status 08/17/2015 FINAL  Final   Organism  ID, Bacteria ESCHERICHIA COLI  Final   Organism ID, Bacteria PROTEUS MIRABILIS  Final   Organism ID, Bacteria KLEBSIELLA PNEUMONIAE  Final   Organism ID, Bacteria ENTEROCOCCUS GALLINARUM  Final      Susceptibility   Escherichia coli - MIC*    AMPICILLIN >=32 RESISTANT Resistant     CEFTAZIDIME 4 RESISTANT Resistant     CEFAZOLIN >=64 RESISTANT Resistant     CEFTRIAXONE 16 RESISTANT Resistant     GENTAMICIN 2 SENSITIVE Sensitive     IMIPENEM >=16 RESISTANT Resistant     TRIMETH/SULFA <=20 SENSITIVE Sensitive     Extended ESBL POSITIVE Resistant     PIP/TAZO Value in next row Resistant      RESISTANT>=128    CIPROFLOXACIN Value in next row Sensitive      SENSITIVE<=0.25    * HEAVY GROWTH ESCHERICHIA COLI   Klebsiella pneumoniae - MIC*    AMPICILLIN Value in next row Resistant      SENSITIVE<=0.25    CEFTAZIDIME Value in next row Resistant      SENSITIVE<=0.25    CEFAZOLIN Value in next row Resistant      SENSITIVE<=0.25    CEFTRIAXONE Value in next row Resistant      SENSITIVE<=0.25    CIPROFLOXACIN Value in next row Resistant      SENSITIVE<=0.25    GENTAMICIN Value in next row Sensitive      SENSITIVE<=0.25    IMIPENEM Value in next row Resistant  SENSITIVE<=0.25    TRIMETH/SULFA Value in next row Resistant      SENSITIVE<=0.25    PIP/TAZO Value in next row Resistant      RESISTANT>=128    * LIGHT GROWTH KLEBSIELLA PNEUMONIAE   Proteus mirabilis - MIC*    AMPICILLIN Value in next row Resistant      RESISTANT>=128    CEFTAZIDIME Value in next row Sensitive      RESISTANT>=128    CEFAZOLIN Value in next row Sensitive      RESISTANT>=128    CEFTRIAXONE Value in next row Sensitive      RESISTANT>=128    CIPROFLOXACIN Value in next row Sensitive      RESISTANT>=128    GENTAMICIN Value in next row Sensitive      RESISTANT>=128    IMIPENEM Value in next row Sensitive      RESISTANT>=128    TRIMETH/SULFA Value in next row Sensitive      RESISTANT>=128    PIP/TAZO  Value in next row Sensitive      SENSITIVE<=4    * MODERATE GROWTH PROTEUS MIRABILIS   Enterococcus gallinarum - MIC*    AMPICILLIN Value in next row Resistant      SENSITIVE<=4    GENTAMICIN SYNERGY Value in next row Sensitive      SENSITIVE<=4    CIPROFLOXACIN Value in next row Resistant      RESISTANT>=8    TETRACYCLINE Value in next row Resistant      RESISTANT>=16    * MODERATE GROWTH ENTEROCOCCUS GALLINARUM  Wound culture     Status: None   Collection Time: 08/15/15  2:53 PM  Result Value Ref Range Status   Specimen Description WOUND  Final   Special Requests NONE  Final   Gram Stain FEW WBC SEEN NO ORGANISMS SEEN   Final   Culture NO GROWTH 3 DAYS  Final   Report Status 08/18/2015 FINAL  Final  C difficile quick scan w PCR reflex     Status: None   Collection Time: 08/17/15 11:34 AM  Result Value Ref Range Status   C Diff antigen NEGATIVE NEGATIVE Final   C Diff toxin NEGATIVE NEGATIVE Final   C Diff interpretation Negative for C. difficile  Final    Assessment: 49 yo female admitted to ICU with septic shock and stage IV sacral decubitus. Pharmacy consulted for dosing and monitoring of meropenem and Daptomycin. Patient has ESRD receiving HD. 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 Currently patient is remaining afebrile and showing clinical improvement.  Daptomycin originally started on 9/28 (day 13 of treatment) and Meropenem 9/24 (day 17 of treatment).  Plan:  Will continue patients current regimen of meropenum 500mg  IV daily and Daptomycin   500mg  (6mg /kg) q48 hours. Pharmacy will continue to monitor patients labs and renal function, and make adjustments as needed.   Cher Nakai, PharmD Pharmacy Resident

## 2015-08-29 NOTE — Evaluation (Signed)
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 Intra-abdominal abscesses s/p lap LOA and drainage 03/2013. Subsequently has experienced numerous infections and hospitalizations since that time. Current hospitalization due to unresponsiveness, hypotension; admitted for septic shock likely due to infected sacral ulcer. 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 Later found to have GI bleed and rectal ulcer.    Clinical Impression  Pt is bed bound and can only respond to yes/no questions due to her current trach. Pt is unable to actively move any limb through full AROM independently; even with PROM there are range limitations due to prolonged bed rest. Pt is more capable of producing AROM with LUE; specifically in the motions of elbow flexion, wrist extension, and gross finger flexion. Pt is not capable of any higher level mobility at this time. Pt requires further acute PT services in order to maintain and progress current level of mobility through PROM/AROM therapeutic exercises.     Follow Up Recommendations [facility]    Equipment Recommendations       Recommendations for Other Services       Precautions / Restrictions Precautions Precaution Comments: stage 4 sacral ulcer, NG tube/NPO, trach, contact isolation Restrictions Weight Bearing Restrictions: No      Mobility  Bed Mobility Overal bed mobility: Needs Assistance             General bed mobility comments: pt is bed ridden and currently not able to perfrom anything other that  slight AROM of LUE. No bed mobility performed today but pt would reqauire max assist +2 or a mechanical lift if mobility needed to be performed.   Transfers Overall transfer level:  (not performed today due to pt mobility level)                  Ambulation/Gait Ambulation/Gait assistance:  (not performed today due to pt's mobility level)              Stairs            Wheelchair Mobility    Modified Rankin (Stroke Patients Only)       Balance Overall balance assessment: No apparent balance deficits (not formally assessed)                                           Pertinent Vitals/Pain Pain Assessment:  (pt unable to rate pain due to communication difficulties)    Home Living Family/patient expects to be discharged to:: Skilled nursing facility                      Prior Function Level of Independence: Needs assistance   Gait / Transfers Assistance Needed: pt is bed bound and does not transfer at this time  ADL's / Homemaking Assistance Needed: Dependent for all ADLs        Hand Dominance        Extremity/Trunk Assessment   Upper Extremity Assessment: Generalized weakness;RUE deficits/detail;LUE deficits/detail RUE Deficits / Details: not able to actively  move RUE at all; possible sensation deficits (did not respond to pinch)    RUE Sensation: decreased light touch LUE Deficits / Details: able to actively move L hand to face; decent elbow flexion ROM; passive elbow extension to near neutral; active wrist extension to neutral, weak ability to finger grasp, trace ablity to supinate/pronate forearm   Lower Extremity Assessment: RLE deficits/detail;LLE deficits/detail RLE Deficits / Details: no AROM noted in RLE; sensation assessed on bottom of heel, pt nodded that she could feel light touch LLE Deficits / Details: trace muscle activity noted for hip adduction; no AROM produced. Sensation assessed on bottom of heel; pt  nodded that she could feel light touch.      Communication   Communication: Tracheostomy  Cognition Arousal/Alertness: Lethargic Behavior During Therapy: WFL for tasks assessed/performed (limited ability to participate) Overall Cognitive Status: Within Functional Limits for tasks assessed (pt responded as appropriately to questions/commands as possible)                      General Comments      Exercises Total Joint Exercises Ankle Circles/Pumps: PROM;Both;15 reps;Supine Hip ABduction/ADduction: PROM;Both;15 reps;Supine Knee Flexion: PROM;Both;15 reps;Supine (hip and knee flexion combined movement) General Exercises - Upper Extremity Shoulder Flexion: PROM;Both;15 reps;Supine Shoulder ABduction: PROM;Both;10 reps;Supine Elbow Flexion: PROM;Both;15 reps;AAROM (AAROM of LUE) Wrist Extension: AAROM;Left;10 reps Digit Composite Flexion: PROM;Left;10 reps;AAROM Other Exercises Other Exercises: cervical rotation x 10 (AROM); limited range      Assessment/Plan    PT Assessment Patient needs continued PT services  PT Diagnosis Generalized weakness   PT Problem List Decreased strength;Decreased range of motion;Decreased activity tolerance;Decreased mobility  PT Treatment Interventions Therapeutic exercise;Manual techniques;Patient/family education   PT Goals (Current goals can be found in the Care Plan section) Acute Rehab PT Goals Patient Stated Goal: no goal stated    Frequency Min 2X/week   Barriers to discharge        Co-evaluation               End of Session   Activity Tolerance: Patient limited by lethargy Patient left: in bed;with bed alarm set;with call bell/phone within reach Nurse Communication: Mobility status         Time: 1610-9604 PT Time Calculation (min) (ACUTE ONLY): 24 min   Charges:         PT G CodesGeorgina Peer 08/29/2015, 12:35 PM

## 2015-08-29 NOTE — Progress Notes (Signed)
[facility] Progess Note  Name: Courtney [name] MRN: [id number] DOB: [birth year]    ADMISSION DATE:  Aug 13, 2015   ASSESSMENT/PLAN  Patient is status post Gastric bypass with sleeve gastrectomy March 2014 at [facility] complicated by SBO requiring lysis of adhesions with subsequent small bowel enterotomy resulting in shock and multiple cardiac arrests, chronic respiratory failure s/p tracheostomy, DVT/PE on coumadin, HTN, DM2, OSA, COPD, ESRD. Intra-abdominal abscesses s/p lap LOA and drainage 03/2013. Subsequently has experienced numerous infections and hospitalizations since that time. Most recently on this admission she is admitted for severe septic shock and acute kidney injury as well as chronically contaminated decubitus ulcer. Her course on this admission has been, complicated by ischemic stroke, likely cardioembolic, and GI bleeding suspected to be from a rectal ulcer complicated by presence of a fecal tube.    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 History of obstructive sleep apnea History of COPD P:  Supplemental O2 to maintain SpO2 >90 If requires vent support, will need to change trach to cuffed Currently doing well with trach collar and has not required ventilator support. -Patient is noted to have copious secretions with concomitant drop in her oxygen saturation. This responds to suctioning.   CARDIOVASCULAR A:  -Initial septic shock on pressors, subsequently developed hemorrhagic shock. Currently weaned off of all pressors. -Chronic steroids - possible component of adrenal insufficiency,currently on steroids. -Nonsustained ventricular tachycardia; history of coronary artery disease, diastolic congestive heart failure, as well as monomorphic and polymorphic ventricular tachycardia on previous admissions, which had responded to amiodarone. -History of cardiac  arrest 3. -Echocardiogram from 08/22/2015 results were reviewed; ejection fraction was 55-60%, pulmonary systolic pressure was 39 mmHg P:  MAP goal > 60 mmHg Taper stress dose steroids as permitted by BP - we'll continue to monitor.  RENAL A:  ESRD - off CRRT-plan for HD today.  Hypokalemia, resolved P:  Monitor BMET intermittently Monitor I/Os Correct electrolytes as indicated   GASTROINTESTINAL A:  -Gastrointestinal bleeding , appears to be doing better. Her stool amount has decreased, with no frank blood and more clots. -Continue to monitor stool output, restart tube feeding at lower rate -Hx of C. Diff - PCR neg -Incontinent of stool with chronic contamination of sacral decubitus ulcer. P:  -No anticoagulation, aspirin was discontinued due to GI bleeding, can consider restarting on Monday if hemoglobins are stable. -watch Platelet counts as well -c.diff PCR>>neg -As we have not ruled out gastritis or peptic ulcer disease as a cause of the patient's Intestinal bleeding, we will continue IV Protonix with twice a day dosing. Can consider change to once daily dosing if no further episodes of bleeding.  HEMATOLOGIC A:  Acute on chronic anemia Thrombocytopenia, . Etiology unclear. Platelet counts have been improving P:  DVT px: SCDs Monitor CBC intermittently Transfuse per usual guidelines -HIT panel 9/25 - negative -will follow hem.ONC recs  INFECTIOUS A:  -Severe sepsis, resolved. Was secondary to infected sacral decubitus ulcer. -Sacral decubitus ulcer with abscess -decubitus ulcer with history of osteomyelitis. 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 P:  Monitor temp, WBC count Micro and abx as above   ENDOCRINE A:  DM 2, controlled Chronic prednisone therapy - was on chronic dose 10 mg  BID Secondary adrenal insuff P:  CBGs and SSI - mod scale -Stress dose steroids -  wean as able based on BP; will decrease dose of hydrocortisone IV to 25 mg every 8 hours.  NEUROLOGIC A:  Acute encephalopathy, -somnolent still, suspect due to chronic sepsis with metabolic encephalopathy, and, complicated by development of CVA. Follow up Neuro recs -will consider TEE next week  Recommend Transitioning to [facility]   ADMISSION DATE: 08/18/15 CONSULTATION DATE: 9/23  INITIAL PRESENTATION:  33 F who has been in medical facilities (hosp, LTAC, rehab) for 2 yrs following gastric bypass surgery with multiple complications. Now with chronic trach, ESRD, profound debilitation, severe sacral pressure ulcer. Was seemingly making progress and transferred to rehab facility approx one week prior to this admission. She was sent to [facility] ED with AMS and hypotension. Working dx of severe sepsis/septic shock due to infected sacral pressure ulcer. Also has R side externalized HD cath and tunneled L IJ CVL. She has history of resistant bacterial infections. She is on chronic steroids. Her husband indicates that she has been diagnosed with adrenal insuff at some point during the past 2 yrs  MAJOR EVENTS/TEST RESULTS:  Admission 02/07/14-05/07/14 Admission 07/21/14-09/06/14 Discharged to [facility]. Pt had palliative consult at that time, were asked to sign off by husband.  9/23 CT head: NAD 9/23 EEG: no epileptiform activity 9/23 PRBCs for Hgb 6.4 9/24 bedside debridement of sacral wound. Abscess drained 9/25 Off vasopressors. More alert. No distress. Worsening thrombocytopenia. Vanc DC'd 9/29  Dr. Sampson Goon (I.D) excused from the case by patient's husband. 10/2 MRI -multiple infarcts 10/3 tracheal bleeding- transfused platelets 10/3 hospitalist service excused from the case by patient's husband 08/22/2015 Echocardiogram ejection fraction was 55-60%, pulmonary systolic pressure was 39 mmHg 16/10 restart  TF's at lower rate, attempt reg HD   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 History of sepsis from C. glabrata MRSA PCR 9/23 >> NEG Urine 9/23 >>  Wound (swab) 9/23 >> multiple organisms Wound (debridement) 9/24 >> Enterococcus, K. Pneumoniae, P. Mirabilis, VRE Blood 9/23 >> neg CDiff 9/27>>neg  ANTIMICROBIALS:  Aztreonam 9/23 >> 9/24 Vanc 9/23 >> 9/25 Vanc 9/26>>9/27 Meropenem 9/23 >>  Daptomycin 9/27>>  Abx per ID service    SUBJECTIVE:  RASS 0. + F/C weakly.   Review of Systems:  Pt currently nonverbal, can not provide history or review of systems.    VITAL SIGNS: Temp:  [97.6 F (36.4 C)-99 F (37.2 C)] 99 F (37.2 C) (10/10 1400) Pulse Rate:  [87-117] 108 (10/10 1500) Resp:  [0-38] 3 (10/10 1500) BP: (103-165)/(43-96) 146/43 mmHg (10/10 1500) SpO2:  [100 %] 100 % (10/10 1500) FiO2 (%):  [28 %-40 %] 28 % (10/10 1400) Weight:  [84.3 kg (185 lb 13.6 oz)] 84.3 kg (185 lb 13.6 oz) (10/10 0600) HEMODYNAMICS:   VENTILATOR SETTINGS: Vent Mode:  [-]  FiO2 (%):  [28 %-40 %] 28 % INTAKE / OUTPUT:  Intake/Output Summary (Last 24 hours) at 08/29/15 1622 Last data filed at 08/29/15 1400  Gross per 24 hour  Intake   1115 ml  Output    478 ml  Net    637 ml    PHYSICAL EXAMINATION: Physical Examination:   VS: BP 146/43 mmHg  Pulse 108  Temp(Src) 99 F (37.2 C) (Oral)  Resp 3  Ht 5\' 9"  (1.753 m)  Wt 84.3 kg (185 lb 13.6 oz)  BMI 27.43 kg/m2  SpO2 100%  General Appearance: No distress  Neuro:, mental status reduced, though better than yesterday. Right upper and lower extremity strength 0 out of 5 left upper and left lower extremity strength, 1 out of 5 HEENT: PERRLA, EOM intact. Pulmonary: normal breath sounds   CardiovascularNormal  S1,S2.  No m/r/g.   Abdomen: Benign, Soft, non-tender. Renal:  No costovertebral tenderness  GU:  Not performed at this time. Endocrine: No evident thyromegaly. Skin:   warm, no rashes, no ecchymosis  Extremities: normal, no cyanosis, clubbing. NEURO: profoundly weak diffusely   LABS:   LABORATORY PANEL:   CBC  Recent Labs Lab 08/28/15 1442  WBC 10.3  HGB 8.7*  HCT 26.9*  PLT 82*    Chemistries   Recent Labs Lab 08/29/15 0511  NA 139  K 3.7  CL 105  CO2 30  GLUCOSE 89  BUN 23*  CREATININE 0.81  CALCIUM 7.9*  MG 1.9  PHOS 2.4*     Recent Labs Lab 08/28/15 1925 08/28/15 2347 08/29/15 0353 08/29/15 0746 08/29/15 1222 08/29/15 1540  GLUCAP 112* 148* 94 100* 116* 116*   No results for input(s): PHART, PCO2ART, PO2ART in the last 168 hours.  Recent Labs Lab 08/26/15 0408 08/26/15 1144 08/28/15 1442  ALBUMIN 2.1* 2.0* 1.9*    Cardiac Enzymes No results for input(s): TROPONINI in the last 168 hours.  RADIOLOGY:  No new CXR  Will attempt to call husband later today  Billy Fischer, MD PCCM service Mobile [phone] Pager [phone]

## 2015-08-29 NOTE — Progress Notes (Addendum)
Nutrition Follow-up  DOCUMENTATION CODES:   Severe malnutrition in context of acute illness/injury  INTERVENTION:   EN: discussed nutritional poc during ICU rounds with MD Sung Amabile and Lawernce Ion RN; MD agreeable to increasing TF rate; recommend increasing TF to rate of 30 ml/hr; if tolerates, recommend titrating to goal of 40 ml/hr with continued Prostat 6 times. Pam RN to relay plan for increased TF rate to pt's husband today  NUTRITION DIAGNOSIS:   Inadequate oral intake related to wound healing, acute illness, chronic illness as evidenced by NPO status, estimated needs. Being addressed via TF  GOAL:   Patient will meet greater than or equal to 90% of their needs  MONITOR:    (Energy Intake, Anthropometrics, Digestive System, Electrolyte/Renal Profile, Glucose Profile)  REASON FOR ASSESSMENT:   Consult Enteral/tube feeding initiation and management  ASSESSMENT:    Pt remains on trach collar; received HD yesterday with plans for HD again tomorrow with limited UF, PT has been consulted  Diet Order:  Diet NPO time specified   EN: tolerating Jevity 1.5 TF at rate of 15 ml/hr, Prostat 6 times daily  Digestive System: +loose BMs, no signs of bleeding, no vomitting, abdomen soft/obese, BS hypoactive  Skin:   (stage IV sacrum, stage III hip, unstageable foot; rectal ulcer from fleixseal, ulcer in nose from dobhoff)  Electrolyte and Renal Profile:  Recent Labs Lab 08/27/15 0410 08/28/15 0340 08/28/15 1442 08/29/15 0511  BUN 15 21* 32* 23*  CREATININE 0.65 0.74 1.13* 0.81  NA 140 143 140 139  K 3.9 3.1* 4.1 3.7  MG 1.6* 1.6*  --  1.9  PHOS 2.7 2.6 3.6 2.4*   Glucose Profile:  Recent Labs  08/28/15 2347 08/29/15 0353 08/29/15 0746  GLUCAP 148* 94 100*   Nutritional Anemia Profile:  CBC Latest Ref Rng 08/28/2015 08/28/2015 08/27/2015  WBC 3.6 - 11.0 K/uL 10.3 8.9 9.9  Hemoglobin 12.0 - 16.0 g/dL 1.6(X) 0.9(U) 0.4(V)  Hematocrit 35.0 - 47.0 % 26.9(L) 28.7(L) 26.5(L)   Platelets 150 - 440 K/uL 82(L) 79(L) 62(L)    Meds: acidophilus, imodium prn, ss novolog, midodrine, MVI, K phos neutral  Height:   Ht Readings from Last 1 Encounters:  2015-08-13  (1.753 m)    Weight:   Wt Readings from Last 1 Encounters:  08/29/15 185 lb 13.6 oz (84.3 kg)   Filed Weights   08/27/15 0416 08/28/15 0500 08/29/15 0600  Weight: 185 lb 3 oz (84 kg) 182 lb 15.7 oz (83 kg) 185 lb 13.6 oz (84.3 kg)    BMI:  Body mass index is 27.43 kg/(m^2).  Estimated Nutritional Needs:   Kcal:  4098-1191 kcals (BEE 1434, 1.2 AF, 1.2-1.4 IF)   Protein:  113-150 g/kg (1.5-2.0 g/kg) but may be closer to 150-188 g (2.0-2.5 g/kg)   Fluid:  1000 mL plus UOP  HIGH Care Level  Romelle Starcher MS, RD, LDN [phone] Pager

## 2015-08-29 NOTE — Progress Notes (Signed)
ELECTROLYTE MANAGEMENT - FOLLOW UP   Pharmacy Consult for Electrolyte Management Indication: electrolyte management   Allergies  Allergen Reactions  . Contrast Media [Iodinated Diagnostic Agents] Anaphylaxis  . Ampicillin Rash    Patient Measurements: Height:  (175.3 cm) Weight: 182 lb 15.7 oz (83 kg) IBW/kg (Calculated) : 66.2   Vital Signs: Temp: 98.7 F (37.1 C) (10/10 0400) Temp Source: Oral (10/10 0400) BP: 143/84 mmHg (10/10 0600) Pulse Rate: 101 (10/10 0600) Intake/Output from previous day: 10/09 0701 - 10/10 0700 In: 1165 [NG/GT:845; IV Piggyback:320] Out: 478  Intake/Output from this shift: Total I/O In: 665 [NG/GT:665] Out: -   Labs:  Recent Labs  08/27/15 0410 08/28/15 0340 08/28/15 1040 08/28/15 1442 08/29/15 0511  WBC 9.9  --  8.9 10.3  --   HGB 8.7*  --  9.3* 8.7*  --   HCT 26.5*  --  28.7* 26.9*  --   PLT 62*  --  79* 82*  --   APTT 32 30  --   --  31     Recent Labs  08/26/15 1144 08/27/15 0410 08/28/15 0340 08/28/15 1442 08/29/15 0511  NA 139 140 143 140 139  K 3.8 3.9 3.1* 4.1 3.7  CL 108 108 117* 108 105  CO2 GLUCOSE 75 88 89 135* 89  BUN 12 15 21* 32* 23*  CREATININE 0.47 0.65 0.74 1.13* 0.81  CALCIUM 7.8* 7.8* 6.3* 7.9* 7.9*  MG  --  1.6* 1.6*  --  1.9  PHOS 1.8* 2.7 2.6 3.6 2.4*  ALBUMIN 2.0*  --   --  1.9*  --    Estimated Creatinine Clearance: 95.6 mL/min (by C-G formula based on Cr of 0.81).    Recent Labs  08/28/15 1611 08/28/15 1925 08/29/15 0353  GLUCAP 111* 112* 94    Medical History: Past Medical History  Diagnosis Date  . Obesity   . Dyslipidemia   . Hypertension   . Coronary artery disease     s/p BMS 2010 LAD  . Dysrhythmia     ventricular tachycardia resolved after LAD stent and beta blocker  . Diabetes mellitus     with retinopathy, neuropathy and microalbuminemia  . ESRD (end stage renal disease) on dialysis     Medications:  Scheduled:  . acidophilus  1 capsule  Oral Daily  . amantadine  200 mg Oral Q7 days  . antiseptic oral rinse  7 mL Mouth Rinse q12n4p  . aspirin  324 mg Oral Daily  . budesonide (PULMICORT) nebulizer solution  0.5 mg Nebulization BID  . chlorhexidine  15 mL Mouth Rinse BID  . DAPTOmycin (CUBICIN)  IV  500 mg Intravenous Q48H  . feeding supplement (PRO-STAT SUGAR FREE 64)  30 mL Oral 6 times per day  . free water  100 mL Per Tube 3 times per day  . hydrocortisone sod succinate (SOLU-CORTEF) inj  25 mg Intravenous Q8H  . insulin aspart  0-15 Units Subcutaneous 6 times per day  . ipratropium-albuterol  3 mL Nebulization BID  . lidocaine  1 patch Transdermal Q24H  . meropenem (MERREM) IV  500 mg Intravenous q1800  . midodrine  10 mg Oral TID WC  . multivitamin  5 mL Per Tube Daily  . nystatin   Topical TID  . pantoprazole (PROTONIX) IV  40 mg Intravenous Q12H  . phosphorus  250 mg Oral BID  . potassium phosphate IVPB (mmol)  15 mmol Intravenous Once  .  sodium hypochlorite   Irrigation BID    Assessment: 49 yo female admitted for sepsis, found to have hypokalemia. Pharmacy consulted for electrolyte management.  CCa: 9.4  Plan:  Magnesium is low today at 1.6. Will replace with magnesium sulfate 2 g iv once and f/u am labs.    Pharmacy will continue to monitor and adjust per consult.   10/9 AM K+ 3.1, Ca 6.3 (corr. 7.9) and Mg 1.6. 40 mEq KCl, 1 gram calcium gluconate, and 2 grams magnesium sulfate ordered. Recheck in AM.  10/10 PO4 2.4. Neutra-phos 250 mg PO x 2 doses ordered. Recheck in AM.   Yonah Tangeman S, Pharm.D. Clinical Pharmacist 08/29/2015,6:11 AM

## 2015-08-29 NOTE — Care Management Note (Signed)
Case Management Note  Patient Details  Name: Courtney [name] MRN: [id number] Date of Birth: 14-May-1966  Subjective/Objective:   Following progression. CSW following for placement at [facility]. LTACH not an option due to [facility] and [facility] will not accept patient for multiple reasons from each agency. Discussed in team progression. Plan is to transition to [facility] by the end of the weak.  CSW updated.              Action/Plan: [facility]  Expected Discharge Date:                  Expected Discharge Plan:  SNF  In-House Referral:     Discharge planning Services  CM Consult  Post Acute Care Choice:    Choice offered to:     DME Arranged:    DME Agency:     HH Arranged:    HH Agency:     Status of Service:  In process, will continue to follow  Medicare Important Message Given:    Date Medicare IM Given:    Medicare IM give by:    Date Additional Medicare IM Given:    Additional Medicare Important Message give by:     If discussed at Long Length of Stay Meetings, dates discussed:    Additional Comments:  [name], RN 08/29/2015, 9:14 AM

## 2015-08-29 NOTE — Plan of Care (Signed)
Problem: ICU Phase Progression Outcomes Goal: O2 sats trending toward baseline Outcome: Progressing Trach collar 28%.  Good cough effort. Trach suctioned x4 for thin white secretions. Lung sounds diminished. T MAX 99.0 oral. On merrem and cubicin Goal: Hemodynamically stable Outcome: Progressing VSS.  Midodrine given Goal: Pain controlled with appropriate interventions Outcome: Progressing Tylenol for pain after PT and dressing change.  She nods yes to relief Goal: Initial discharge plan identified Outcome: Progressing Care Manager working to find placement. Reported [facility] and [facility] decline  Problem: Phase I Progression Outcomes Goal: Progress activity as tolerated unless otherwise ordered Outcome: Progressing PT saw today. PROM and AROM attempted. Pt did not appear interested in exercises. See PT note. Goal: Tolerating diet Outcome: Progressing Tolerating TF. Stools turning orange brown and less runny.  One loose stool this shift. Immodium prn may be given if increases. Barrier cream to buttocks excoriation.Pt has large decubitus sacrum. Trying to meet nutritional needs in pt with wound issue and recent GI bleed

## 2015-08-29 NOTE — Progress Notes (Signed)
NEUROLOGY NOTE  S: No changes today per nursing, family not at bedside  ROS unobtainable to aphasia  O: 99    146/54    105   23 No distress, overweight Normocephalic, oropharynx clear Supple, no JVD CTA B, no wheezing tachycardic, no murmur Diffuse edema, no C/C  Sleepy, opens to pain but does do some mimicking Pupils 4mm and sluggish, weak R corneal, good L corneal, R droop Localizes on L, trace movement on R  A/P: 1. 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 This will still cause permanent deficits of R hemiparesis and aphasia which will not likely get better; Pt has problems following still 2. Encephalopathy-still fluctuating but overall better - Continue amantadine  BID PO - Still await Hem/Onc consult to determine etiology of low platelets and help to determine if there is some underlying hypercoaguable disorder which led to stroke - Still needs TEE when stable but now pt cant be anticoagulated anyhow - Will need hypercoaguable w/u if TEE is neg - Would start ASA as soon as possible from medical standpoint - Try to keep Hgb > 8 - Keep MAP > 60 for good perfusion -  Will update husband over the phone - Will follow and signout to Dr. Herma Carson

## 2015-08-29 NOTE — Progress Notes (Signed)
Subjective:  Pt seen at bedside, awake this AM.  Had HD yesterday.  Resting comfortably at the moment.   Objective:  Vital signs in last 24 hours:  Temp:  [97.6 F (36.4 C)-98.7 F (37.1 C)] 98.5 F (36.9 C) (10/10 0800) Pulse Rate:  [84-111] 107 (10/10 0800) Resp:  [0-30] 15 (10/10 0800) BP: (103-165)/(41-96) 143/76 mmHg (10/10 0800) SpO2:  [100 %] 100 % (10/10 0800) FiO2 (%):  [28 %-40 %] 28 % (10/10 0800) Weight:  [84.3 kg (185 lb 13.6 oz)] 84.3 kg (185 lb 13.6 oz) (10/10 0600)  Weight change: 1.3 kg (2 lb 13.9 oz) Filed Weights   08/27/15 0416 08/28/15 0500 08/29/15 0600  Weight: 84 kg (185 lb 3 oz) 83 kg (182 lb 15.7 oz) 84.3 kg (185 lb 13.6 oz)    Intake/Output: I/O last 3 completed shifts: In: 1480 [NG/GT:1160; IV Piggyback:320] Out: 478 [Other:478]   Intake/Output this shift:  Total I/O In: 15 [NG/GT:15] Out: -   Physical Exam: General: No acute distress  Head: Normocephalic, atraumatic. +NGT   Eyes: Eyes open, tracks with eyes  Neck: Trach in place  Lungs:  Scattered rhonchi, normal effort  Heart: S1S2 no rubs  Abdomen:  Soft, nontender, BS present  Extremities:  +right upper extremity edema, trace edema of left upper extremity and dependent  Neurologic: Awake, not following comamnds  Skin: No apparent rash  Access: R IJ permcath    Basic Metabolic Panel:  Recent Labs Lab 08/24/15 2211 08/25/15 0426  08/26/15 1144 08/27/15 0410 08/28/15 0340 08/28/15 1442 08/29/15 0511  NA 139 141  < > 139 140 143 140 139  K 4.3 4.0  < > 3.8 3.9 3.1* 4.1 3.7  CL 109 109  < > 108 108 117* 108 105  CO2 27 29  < > 28 27 23 27 30   GLUCOSE 74 76  < > 75 88 89 135* 89  BUN 15 13  < > 12 15 21* 32* 23*  CREATININE 0.46 0.41*  < > 0.47 0.65 0.74 1.13* 0.81  CALCIUM 7.7* 7.9*  < > 7.8* 7.8* 6.3* 7.9* 7.9*  MG 1.9 1.8  --   --  1.6* 1.6*  --  1.9  PHOS 2.5 2.1*  2.1*  < > 1.8* 2.7 2.6 3.6 2.4*  < > = values in this interval not displayed.  Liver Function  Tests:  Recent Labs Lab 08/25/15 1608 08/25/15 2103 08/26/15 0408 08/26/15 1144 08/28/15 1442  ALBUMIN 2.1* 2.1* 2.1* 2.0* 1.9*   No results for input(s): LIPASE, AMYLASE in the last 168 hours. No results for input(s): AMMONIA in the last 168 hours.  CBC:  Recent Labs Lab 08/24/15 1648 08/25/15 0426 08/25/15 1616 08/26/15 0408 08/27/15 0410 08/28/15 1040 08/28/15 1442  WBC 8.2 7.0  --  10.2 9.9 8.9 10.3  NEUTROABS 7.0*  --   --   --   --  7.6*  --   HGB 8.0* 6.8* 8.9* 9.2* 8.7* 9.3* 8.7*  HCT 23.7* 20.4* 26.7* 27.8* 26.5* 28.7* 26.9*  MCV 86.1 86.4  --  87.8 89.8 90.8 91.2  PLT 76* 76*  --  59* 62* 79* 82*    Cardiac Enzymes: No results for input(s): CKTOTAL, CKMB, CKMBINDEX, TROPONINI in the last 168 hours.  BNP: Invalid input(s): POCBNP  CBG:  Recent Labs Lab 08/28/15 1611 08/28/15 1925 08/28/15 2347 08/29/15 0353 08/29/15 0746  GLUCAP 111* 112* 148* 94 100*    Microbiology: Results for orders placed or performed during  the hospital encounter of 08/19/2015  Blood Culture (routine x 2)     Status: None   Collection Time: 08/10/2015  8:51 AM  Result Value Ref Range Status   Specimen Description BLOOD [name]  Final   Special Requests BOTTLES DRAWN AEROBIC AND ANAEROBIC  3CC  Final   Culture NO GROWTH 5 DAYS  Final   Report Status 08/17/2015 FINAL  Final  Blood Culture (routine x 2)     Status: None   Collection Time: 08/09/2015  9:20 AM  Result Value Ref Range Status   Specimen Description BLOOD LEFT ARM  Final   Special Requests BOTTLES DRAWN AEROBIC AND ANAEROBIC  1CC  Final   Culture NO GROWTH 5 DAYS  Final   Report Status 08/17/2015 FINAL  Final  Wound culture     Status: None   Collection Time: 07/22/2015  9:20 AM  Result Value Ref Range Status   Specimen Description DECUBITIS  Final   Special Requests Normal  Final   Gram Stain   Final    FEW WBC SEEN MANY GRAM NEGATIVE RODS RARE GRAM POSITIVE COCCI    Culture   Final    HEAVY GROWTH  ESCHERICHIA COLI MODERATE GROWTH ENTEROBACTER AEROGENES PROTEUS MIRABILIS HEAVY GROWTH ENTEROCOCCUS SPECIES VRE HAVE INTRINSIC RESISTANCE TO MOST COMMONLY USED ANTIBIOTICS AND THE ABILITY TO ACQUIRE RESISTANCE TO MOST AVAILABLE ANTIBIOTICS.    Report Status 08/16/2015 FINAL  Final   Organism ID, Bacteria ESCHERICHIA COLI  Final   Organism ID, Bacteria ENTEROBACTER AEROGENES  Final   Organism ID, Bacteria PROTEUS MIRABILIS  Final   Organism ID, Bacteria ENTEROCOCCUS SPECIES  Final      Susceptibility   Enterobacter aerogenes - MIC*    CEFTAZIDIME <=1 SENSITIVE Sensitive     CEFAZOLIN >=64 RESISTANT Resistant     CEFTRIAXONE <=1 SENSITIVE Sensitive     CIPROFLOXACIN <=0.25 SENSITIVE Sensitive     GENTAMICIN <=1 SENSITIVE Sensitive     IMIPENEM 1 SENSITIVE Sensitive     TRIMETH/SULFA <=20 SENSITIVE Sensitive     * MODERATE GROWTH ENTEROBACTER AEROGENES   Escherichia coli - MIC*    AMPICILLIN <=2 SENSITIVE Sensitive     CEFTAZIDIME <=1 SENSITIVE Sensitive     CEFAZOLIN <=4 SENSITIVE Sensitive     CEFTRIAXONE <=1 SENSITIVE Sensitive     CIPROFLOXACIN <=0.25 SENSITIVE Sensitive     GENTAMICIN <=1 SENSITIVE Sensitive     IMIPENEM <=0.25 SENSITIVE Sensitive     TRIMETH/SULFA <=20 SENSITIVE Sensitive     * HEAVY GROWTH ESCHERICHIA COLI   Proteus mirabilis - MIC*    AMPICILLIN >=32 RESISTANT Resistant     CEFTAZIDIME <=1 SENSITIVE Sensitive     CEFAZOLIN 8 SENSITIVE Sensitive     CEFTRIAXONE <=1 SENSITIVE Sensitive     CIPROFLOXACIN <=0.25 SENSITIVE Sensitive     GENTAMICIN <=1 SENSITIVE Sensitive     IMIPENEM 1 SENSITIVE Sensitive     TRIMETH/SULFA <=20 SENSITIVE Sensitive     * PROTEUS MIRABILIS   Enterococcus species - MIC*    AMPICILLIN >=32 RESISTANT Resistant     VANCOMYCIN >=32 RESISTANT Resistant     GENTAMICIN SYNERGY SENSITIVE Sensitive     TETRACYCLINE Value in next row Resistant      RESISTANT>=16    * HEAVY GROWTH ENTEROCOCCUS SPECIES  MRSA PCR Screening     Status:  None   Collection Time: 08/14/2015  2:38 PM  Result Value Ref Range Status   MRSA by PCR NEGATIVE NEGATIVE Final  Comment:        The GeneXpert MRSA Assay (FDA approved for NASAL specimens only), is one component of a comprehensive MRSA colonization surveillance program. It is not intended to diagnose MRSA infection nor to guide or monitor treatment for MRSA infections.   Blood culture (single)     Status: None   Collection Time: 07/31/2015  3:36 PM  Result Value Ref Range Status   Specimen Description BLOOD RIGHT ASSIST CONTROL  Final   Special Requests BOTTLES DRAWN AEROBIC AND ANAEROBIC  Final   Culture NO GROWTH 5 DAYS  Final   Report Status 08/17/2015 FINAL  Final  Wound culture     Status: None   Collection Time: 08/13/15 12:37 PM  Result Value Ref Range Status   Specimen Description WOUND  Final   Special Requests Normal  Final   Gram Stain   Final    FEW WBC SEEN TOO NUMEROUS TO COUNT GRAM NEGATIVE RODS FEW GRAM POSITIVE COCCI    Culture   Final    HEAVY GROWTH ESCHERICHIA COLI MODERATE GROWTH PROTEUS MIRABILIS LIGHT GROWTH KLEBSIELLA PNEUMONIAE MODERATE GROWTH ENTEROCOCCUS GALLINARUM CRITICAL RESULT CALLED TO, READ BACK BY AND VERIFIED WITH: [facility] BORBA AT 1042 08/16/15 DV    Report Status 08/17/2015 FINAL  Final   Organism ID, Bacteria ESCHERICHIA COLI  Final   Organism ID, Bacteria PROTEUS MIRABILIS  Final   Organism ID, Bacteria KLEBSIELLA PNEUMONIAE  Final   Organism ID, Bacteria ENTEROCOCCUS GALLINARUM  Final      Susceptibility   Escherichia coli - MIC*    AMPICILLIN >=32 RESISTANT Resistant     CEFTAZIDIME 4 RESISTANT Resistant     CEFAZOLIN >=64 RESISTANT Resistant     CEFTRIAXONE 16 RESISTANT Resistant     GENTAMICIN 2 SENSITIVE Sensitive     IMIPENEM >=16 RESISTANT Resistant     TRIMETH/SULFA <=20 SENSITIVE Sensitive     Extended ESBL POSITIVE Resistant     PIP/TAZO Value in next row Resistant      RESISTANT>=128    CIPROFLOXACIN Value in  next row Sensitive      SENSITIVE<=0.25    * HEAVY GROWTH ESCHERICHIA COLI   Klebsiella pneumoniae - MIC*    AMPICILLIN Value in next row Resistant      SENSITIVE<=0.25    CEFTAZIDIME Value in next row Resistant      SENSITIVE<=0.25    CEFAZOLIN Value in next row Resistant      SENSITIVE<=0.25    CEFTRIAXONE Value in next row Resistant      SENSITIVE<=0.25    CIPROFLOXACIN Value in next row Resistant      SENSITIVE<=0.25    GENTAMICIN Value in next row Sensitive      SENSITIVE<=0.25    IMIPENEM Value in next row Resistant      SENSITIVE<=0.25    TRIMETH/SULFA Value in next row Resistant      SENSITIVE<=0.25    PIP/TAZO Value in next row Resistant      RESISTANT>=128    * LIGHT GROWTH KLEBSIELLA PNEUMONIAE   Proteus mirabilis - MIC*    AMPICILLIN Value in next row Resistant      RESISTANT>=128    CEFTAZIDIME Value in next row Sensitive      RESISTANT>=128    CEFAZOLIN Value in next row Sensitive      RESISTANT>=128    CEFTRIAXONE Value in next row Sensitive      RESISTANT>=128    CIPROFLOXACIN Value in next row Sensitive  RESISTANT>=128    GENTAMICIN Value in next row Sensitive      RESISTANT>=128    IMIPENEM Value in next row Sensitive      RESISTANT>=128    TRIMETH/SULFA Value in next row Sensitive      RESISTANT>=128    PIP/TAZO Value in next row Sensitive      SENSITIVE<=4    * MODERATE GROWTH PROTEUS MIRABILIS   Enterococcus gallinarum - MIC*    AMPICILLIN Value in next row Resistant      SENSITIVE<=4    GENTAMICIN SYNERGY Value in next row Sensitive      SENSITIVE<=4    CIPROFLOXACIN Value in next row Resistant      RESISTANT>=8    TETRACYCLINE Value in next row Resistant      RESISTANT>=16    * MODERATE GROWTH ENTEROCOCCUS GALLINARUM  Wound culture     Status: None   Collection Time: 08/15/15  2:53 PM  Result Value Ref Range Status   Specimen Description WOUND  Final   Special Requests NONE  Final   Gram Stain FEW WBC SEEN NO ORGANISMS SEEN    Final   Culture NO GROWTH 3 DAYS  Final   Report Status 08/18/2015 FINAL  Final  C difficile quick scan w PCR reflex     Status: None   Collection Time: 08/17/15 11:34 AM  Result Value Ref Range Status   C Diff antigen NEGATIVE NEGATIVE Final   C Diff toxin NEGATIVE NEGATIVE Final   C Diff interpretation Negative for C. difficile  Final    Coagulation Studies: No results for input(s): LABPROT, INR in the last 72 hours.  Urinalysis: No results for input(s): COLORURINE, LABSPEC, PHURINE, GLUCOSEU, HGBUR, BILIRUBINUR, KETONESUR, PROTEINUR, UROBILINOGEN, NITRITE, LEUKOCYTESUR in the last 72 hours.  Invalid input(s): APPERANCEUR    Imaging: US Venous Img Lower Bilateral  08/28/2015   CLINICAL DATA:  Bilateral lower extremity swelling, 2 week duration. Some pain. Previous history deep venous thrombosis.  EXAM: BILATERAL LOWER EXTREMITY VENOUS DOPPLER ULTRASOUND  TECHNIQUE: Gray-scale sonography with graded compression, as well as color Doppler and duplex ultrasound were performed to evaluate the lower extremity deep venous systems from the level of the common femoral vein and including the common femoral, femoral, profunda femoral, popliteal and calf veins including the posterior tibial, peroneal and gastrocnemius veins when visible. The superficial great saphenous vein was also interrogated. Spectral Doppler was utilized to evaluate flow at rest and with distal augmentation maneuvers in the common femoral, femoral and popliteal veins.  COMPARISON:  None.  FINDINGS: RIGHT LOWER EXTREMITY  Common Femoral Vein: No evidence of thrombus. Normal compressibility, respiratory phasicity and response to augmentation.  Saphenofemoral Junction: No evidence of thrombus. Normal compressibility and flow on color Doppler imaging.  Profunda Femoral Vein: No evidence of thrombus. Normal compressibility and flow on color Doppler imaging.  Femoral Vein: No evidence of thrombus. Normal compressibility, respiratory  phasicity and response to augmentation.  Popliteal Vein: No evidence of thrombus. Normal compressibility, respiratory phasicity and response to augmentation.  Calf Veins: Poorly seen because of adjacent arterial calcification  Venous Reflux:  None.  Other Findings:  None.  LEFT LOWER EXTREMITY  Common Femoral Vein: No evidence of thrombus. Normal compressibility, respiratory phasicity and response to augmentation.  Saphenofemoral Junction: No evidence of thrombus. Normal compressibility and flow on color Doppler imaging.  Profunda Femoral Vein: No evidence of thrombus. Normal compressibility and flow on color Doppler imaging.  Femoral Vein: No evidence of thrombus. Normal compressibility, respiratory phasicity  and response to augmentation.  Popliteal Vein: No evidence of thrombus. Normal compressibility, respiratory phasicity and response to augmentation.  Calf Veins: Poorly seen because of adjacent arterial calcification.  Venous Reflux:  None.  Other Findings:  None.  IMPRESSION: No evidence of deep venous thrombosis. The calf veins are poorly seen on each side because of adjacent arterial calcification. No DVT suspected.   Electronically Signed   By: Paulina Fusi M.D.   On: 08/28/2015 13:25     Medications:   . feeding supplement (JEVITY 1.5 CAL/FIBER) 1,000 mL (08/27/15 1301)  . norepinephrine (LEVOPHED) Adult infusion Stopped (08/24/15 0755)  . pureflow 3 each (08/25/15 0826)   . acidophilus  1 capsule Oral Daily  . amantadine  200 mg Oral Q7 days  . antiseptic oral rinse  7 mL Mouth Rinse q12n4p  . aspirin  324 mg Oral Daily  . budesonide (PULMICORT) nebulizer solution  0.5 mg Nebulization BID  . chlorhexidine  15 mL Mouth Rinse BID  . DAPTOmycin (CUBICIN)  IV  500 mg Intravenous Q48H  . feeding supplement (PRO-STAT SUGAR FREE 64)  30 mL Oral 6 times per day  . free water  100 mL Per Tube 3 times per day  . hydrocortisone sod succinate (SOLU-CORTEF) inj  25 mg Intravenous Q8H  . insulin  aspart  0-15 Units Subcutaneous 6 times per day  . ipratropium-albuterol  3 mL Nebulization BID  . lidocaine  1 patch Transdermal Q24H  . meropenem (MERREM) IV  500 mg Intravenous q1800  . midodrine  10 mg Oral TID WC  . multivitamin  5 mL Per Tube Daily  . nystatin   Topical TID  . pantoprazole (PROTONIX) IV  40 mg Intravenous Q12H  . phosphorus  250 mg Oral BID  . potassium phosphate IVPB (mmol)  15 mmol Intravenous Once  . sodium hypochlorite   Irrigation BID   sodium chloride, sodium chloride, acetaminophen (TYLENOL) oral liquid 160 mg/5 mL, alteplase, anticoagulant sodium citrate, lidocaine (PF), lidocaine-prilocaine, loperamide, morphine injection, pentafluoroprop-tetrafluoroeth  Assessment/ Plan:  48 y.o. 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  1. End-stage renal disease on hemodialysis on HD TTHS. The patient has been on dialysis since October of 2014. R IJ permcath.  - Had HD yesterday as missed HD on Saturday due to weather.  No acute indication for HD today.  Will plan for Hd again tomorrow with limited UF.   2. Anemia of CKD: status post PRBC transfusions. - start epogen 10000 units iV with HD.   3. Secondary hyperparathyroidism: PTH low at 55. - has actually received phos as its been low, recheck phos tomorrow.   4. Hypotension: off vassopressors.  - back on midodrine, will continue 10mg  po tid.  Used for BP support with HD.    LOS: 17 Courtney [name],  Courtney [name] 10/10/201610:26 AM

## 2015-08-29 NOTE — Clinical Social Work Note (Signed)
RN CM has informed CSW that patient is now on 28% O2 through trach and that the MD plan is for discharge back to [facility] toward the end of the week. CSW has updated Courtney [name] at [facility]. York Spaniel MSW,LCSW [phone]

## 2015-08-30 DIAGNOSIS — I6350 Cerebral infarction due to unspecified occlusion or stenosis of unspecified cerebral artery: Secondary | ICD-10-CM

## 2015-08-30 LAB — GLUCOSE, CAPILLARY
GLUCOSE-CAPILLARY: 104 mg/dL — AB (ref 65–99)
GLUCOSE-CAPILLARY: 108 mg/dL — AB (ref 65–99)
GLUCOSE-CAPILLARY: 108 mg/dL — AB (ref 65–99)
Glucose-Capillary: 117 mg/dL — ABNORMAL HIGH (ref 65–99)
Glucose-Capillary: 105 mg/dL — ABNORMAL HIGH (ref 65–99)
Glucose-Capillary: 102 mg/dL — ABNORMAL HIGH (ref 65–99)
Glucose-Capillary: 111 mg/dL — ABNORMAL HIGH (ref 65–99)

## 2015-08-30 LAB — BASIC METABOLIC PANEL
ANION GAP: 3 — AB (ref 5–15)
BUN: 37 mg/dL — ABNORMAL HIGH (ref 6–20)
CALCIUM: 7.5 mg/dL — AB (ref 8.9–10.3)
CO2: 29 mmol/L (ref 22–32)
Chloride: 108 mmol/L (ref 101–111)
Creatinine, Ser: 1.09 mg/dL — ABNORMAL HIGH (ref 0.44–1.00)
GFR, EST NON AFRICAN AMERICAN: 58 mL/min — AB (ref >60)
Glucose, Bld: 120 mg/dL — ABNORMAL HIGH (ref 65–99)
Potassium: 3.3 mmol/L — ABNORMAL LOW (ref 3.5–5.1)
SODIUM: 140 mmol/L (ref 135–145)

## 2015-08-30 LAB — PHOSPHORUS: PHOSPHORUS: 3.7 mg/dL (ref 2.5–4.6)

## 2015-08-30 LAB — CK: CK TOTAL: 15 U/L — AB (ref 38–234)

## 2015-08-30 LAB — MAGNESIUM: MAGNESIUM: 2.1 mg/dL (ref 1.7–2.4)

## 2015-08-30 LAB — APTT: aPTT: 32 seconds (ref 24–36)

## 2015-08-30 MED ORDER — JEVITY 1.5 CAL/FIBER PO LIQD
1000.0000 mL | ORAL | Status: DC
  Administered 2015-08-30 – 2015-09-02 (×3): 1000 mL

## 2015-08-30 MED ORDER — POTASSIUM CHLORIDE 10 MEQ/100ML IV SOLN
10.0000 meq | INTRAVENOUS | Status: DC
  Administered 2015-08-30 (×3): 10 meq via INTRAVENOUS
  Filled 2015-08-30 (×3): qty 100

## 2015-08-30 NOTE — Care Management Note (Signed)
Case Management Note  Patient Details  Name: Courtney [name] MRN: [id number] Date of Birth: [birth year]  Subjective/Objective:   Case discussed in rounds. Patient ready to be discharged to SNF with the exception that patient can not go with a dobhoff. Dr. Park Breed is to speak with spouse regarding G-Tube placement.               Action/Plan:   Expected Discharge Date:                  Expected Discharge Plan:  Long Term Acute Care (LTAC)  In-House Referral:     Discharge planning Services  CM Consult  Post Acute Care Choice:    Choice offered to:     DME Arranged:    DME Agency:     HH Arranged:    HH Agency:     Status of Service:  In process, will continue to follow  Medicare Important Message Given:    Date Medicare IM Given:    Medicare IM give by:    Date Additional Medicare IM Given:    Additional Medicare Important Message give by:     If discussed at Long Length of Stay Meetings, dates discussed:    Additional Comments:  [name], RN 08/30/2015, 2:16 PM

## 2015-08-30 NOTE — Progress Notes (Signed)
Pre-hx tx

## 2015-08-30 NOTE — Progress Notes (Signed)
[facility] Critical Care Medicine Progess Note  Name: Courtney [name] MRN: [id number] DOB: [birth year]    ADMISSION DATE:  08/07/2015   ASSESSMENT/PLAN  Patient is status post Gastric bypass with sleeve gastrectomy March 2014 at [facility] complicated by SBO requiring lysis of adhesions with subsequent small bowel enterotomy resulting in shock and multiple cardiac arrests, chronic respiratory failure s/p tracheostomy, DVT/PE on coumadin, HTN, DM2, OSA, COPD, ESRD. Intra-abdominal abscesses s/p lap LOA and drainage 03/2013. Subsequently has experienced numerous infections and hospitalizations since that time. Most recently on this admission she is admitted for severe septic shock and acute kidney injury as well as chronically contaminated decubitus ulcer. Her course on this admission has been, complicated by ischemic stroke, likely cardioembolic, and GI bleeding suspected to be from a rectal ulcer complicated by presence of a fecal tube.    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 History of obstructive sleep apnea P:  Supplemental O2 to maintain SpO2 >90   CARDIOVASCULAR A:  Septic shock, resolved  Hemorrhagic shock, resolved P:  MAP goal > 60 mmHg .  RENAL A:  ESRD  P:  Monitor BMET intermittently Monitor I/Os Correct electrolytes as indicated HD per Renal Service  GASTROINTESTINAL A:  LGIB, resolved Hx of C. Diff Stool incontinence P:  Cont PPI Avoid anticoagulants Cont Flexiseal  HEMATOLOGIC A:  Acute on chronic anemia Thrombocytopenia, resolved.  P:  DVT px: SCDs Monitor CBC intermittently Transfuse per usual guidelines HIT panel 9/25 - negative  INFECTIOUS A:  Severe sepsis, resolved. 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 Now with chronic trach, ESRD, profound debilitation, severe sacral pressure ulcer. Was seemingly making progress and transferred to rehab facility approx one week prior to this admission. She was sent to [facility] ED with AMS and hypotension. Working dx of severe sepsis/septic shock due to infected sacral pressure ulcer. Also has R side externalized HD cath and tunneled L IJ CVL. She has history of resistant bacterial infections. She is on chronic steroids. Her husband indicates that she has been diagnosed with adrenal insuff at some point during the past 2 yrs  MAJOR EVENTS/TEST RESULTS:  Admission 02/07/14-05/07/14 Admission 07/21/14-09/06/14 Discharged to [facility]. Pt had palliative consult at that time, were asked to sign off by husband.  9/23 CT head: NAD 9/23 EEG: no epileptiform activity 9/23 PRBCs for Hgb 6.4 9/24 bedside debridement of sacral wound. Abscess drained 9/25 Off vasopressors. More alert. No distress. Worsening thrombocytopenia. Vanc DC'd 9/29  Dr. Sampson Goon (I.D) excused from the case by patient's husband. 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 History of sepsis from C. glabrata MRSA PCR 9/23 >> NEG Urine 9/23 >>  Wound (swab) 9/23 >> multiple organisms Wound (debridement) 9/24 >> Enterococcus, K. Pneumoniae, P. Mirabilis, VRE Blood 9/23 >> neg CDiff 9/27>>neg  ANTIMICROBIALS:  Aztreonam 9/23 >> 9/24 Vanc 9/23 >> 9/25 Vanc 9/26>>9/27 Meropenem 9/23 >>  Daptomycin 9/27>>  SUBJECTIVE:  RASS 0. + F/C weakly.    VITAL SIGNS: Temp:  [98.3 F (36.8 C)-99.1 F (37.3 C)] 98.5 F (36.9 C) (10/11 1122) Pulse Rate:  [94-113] 97 (10/11 0600) Resp:  [3-34] 20 (10/11 0600) BP: (137-154)/(39-92) 154/54 mmHg (10/11 0600) SpO2:  [100 %] 100 % (10/11 0600) FiO2 (%):  [28 %] 28 % (10/10 2032) Weight:  [87 kg (191 lb 12.8 oz)] 87 kg (191 lb 12.8 oz) (10/11 0500) HEMODYNAMICS:   VENTILATOR SETTINGS: Vent Mode:  [-]  FiO2 (%):  [28 %] 28 % INTAKE / OUTPUT:  Intake/Output Summary (Last 24 hours) at 08/30/15 1441 Last data filed at 08/30/15 0600  Gross per 24 hour  Intake    530 ml  Output      0 ml  Net    530 ml    PHYSICAL EXAMINATION: Physical Examination:   VS: BP 154/54 mmHg  Pulse 97  Temp(Src) 98.5 F (36.9 C) (Axillary)  Resp 20  Ht  (1.753 m)  Wt 87 kg (191 lb 12.8 oz)  BMI 28.31 kg/m2  SpO2 100%  General Appearance: No distress  Neuro:, mental status reduced, though better than yesterday. Right upper and lower extremity strength 0 out of 5 left upper and left lower extremity strength, 1 out of 5 HEENT: PERRLA, EOM intact. Pulmonary: normal breath sounds   CardiovascularNormal S1,S2.  No m/r/g.   Abdomen: Benign, Soft, non-tender. Renal:  No costovertebral tenderness  GU:  Not performed at this time. Endocrine: No evident thyromegaly. Skin:   warm, no rashes, no ecchymosis  Extremities: normal, no cyanosis, clubbing. NEURO: profoundly weak diffusely   LABS:   LABORATORY PANEL:   CBC  Recent Labs Lab  08/28/15 1442  WBC 10.3  HGB 8.7*  HCT 26.9*  PLT 82*    Chemistries   Recent Labs Lab 08/30/15 0435  NA 140  K 3.3*  CL 108  CO2 29  GLUCOSE 120*  BUN 37*  CREATININE 1.09*  CALCIUM 7.5*  MG 2.1  PHOS 3.7     Recent Labs Lab 08/29/15 1540 08/29/15 1943 08/30/15 0007 08/30/15 0420 08/30/15 0801 08/30/15 1226  GLUCAP 116* 114* 108* 117* 105* 102*   No results for input(s): PHART, PCO2ART, PO2ART in the last 168 hours.  Recent Labs Lab 08/26/15 0408 08/26/15 1144 08/28/15 1442  ALBUMIN 2.1* 2.0* 1.9*    Cardiac Enzymes No results for input(s): TROPONINI in the last 168 hours.  RADIOLOGY:  No new CXR  Spoke with husband over phone  Billy Fischer, MD PCCM service Mobile [phone] Pager [phone]

## 2015-08-30 NOTE — Progress Notes (Signed)
Subjective:  Pt awake and alert this AM. Due for HD today. Good blood pressure noted. Did follow commands with her left hand.    Objective:  Vital signs in last 24 hours:  Temp:  [98.5 F (36.9 C)-99.1 F (37.3 C)] 98.7 F (37.1 C) (10/11 0226) Pulse Rate:  [94-117] 97 (10/11 0500) Resp:  [3-38] 20 (10/11 0500) BP: (132-154)/(39-92) 145/65 mmHg (10/11 0500) SpO2:  [100 %] 100 % (10/11 0500) FiO2 (%):  [28 %] 28 % (10/10 2032) Weight:  [87 kg (191 lb 12.8 oz)] 87 kg (191 lb 12.8 oz) (10/11 0500)  Weight change: 2.7 kg (5 lb 15.2 oz) Filed Weights   08/28/15 0500 08/29/15 0600 08/30/15 0500  Weight: 83 kg (182 lb 15.7 oz) 84.3 kg (185 lb 13.6 oz) 87 kg (191 lb 12.8 oz)    Intake/Output: I/O last 3 completed shifts: In: 1560 [NG/GT:1190; IV Piggyback:370] Out: 478 [Other:478]   Intake/Output this shift:  Total I/O In: 300 [NG/GT:300] Out: -   Physical Exam: General: No acute distress  Head: Normocephalic, atraumatic. +NGT   Eyes: Eyes open, tracks with eyes  Neck: Trach in place  Lungs:  Scattered rhonchi, normal effort  Heart: S1S2 no rubs  Abdomen:  Soft, nontender, BS present  Extremities:  +right upper extremity edema, trace edema of left upper extremity and dependent  Neurologic: Awake, follows commands with left hand this AM.  Skin: No apparent rash  Access: R IJ permcath    Basic Metabolic Panel:  Recent Labs Lab 08/25/15 0426  08/27/15 0410 08/28/15 0340 08/28/15 1442 08/29/15 0511 08/30/15 0435  NA 141  < > 140 143 140 139 140  K 4.0  < > 3.9 3.1* 4.1 3.7 3.3*  CL 109  < > 108 117* 108 105 108  CO2 29  < > GLUCOSE 76  < > 88 89 135* 89 120*  BUN 13  < > 15 21* 32* 23* 37*  CREATININE 0.41*  < > 0.65 0.74 1.13* 0.81 1.09*  CALCIUM 7.9*  < > 7.8* 6.3* 7.9* 7.9* 7.5*  MG 1.8  --  1.6* 1.6*  --  1.9 2.1  PHOS 2.1*  2.1*  < > 2.7 2.6 3.6 2.4* 3.7  < > = values in this interval not displayed.  Liver Function Tests:  Recent  Labs Lab 08/25/15 1608 08/25/15 2103 08/26/15 0408 08/26/15 1144 08/28/15 1442  ALBUMIN 2.1* 2.1* 2.1* 2.0* 1.9*   No results for input(s): LIPASE, AMYLASE in the last 168 hours. No results for input(s): AMMONIA in the last 168 hours.  CBC:  Recent Labs Lab 08/24/15 1648 08/25/15 0426 08/25/15 1616 08/26/15 0408 08/27/15 0410 08/28/15 1040 08/28/15 1442  WBC 8.2 7.0  --  10.2 9.9 8.9 10.3  NEUTROABS 7.0*  --   --   --   --  7.6*  --   HGB 8.0* 6.8* 8.9* 9.2* 8.7* 9.3* 8.7*  HCT 23.7* 20.4* 26.7* 27.8* 26.5* 28.7* 26.9*  MCV 86.1 86.4  --  87.8 89.8 90.8 91.2  PLT 76* 76*  --  59* 62* 79* 82*    Cardiac Enzymes: No results for input(s): CKTOTAL, CKMB, CKMBINDEX, TROPONINI in the last 168 hours.  BNP: Invalid input(s): POCBNP  CBG:  Recent Labs Lab 08/29/15 1222 08/29/15 1540 08/29/15 1943 08/30/15 0007 08/30/15 0420  GLUCAP 116* 116* 114* 108* 117*    Microbiology: Results for orders placed or performed during the hospital encounter of 2015/08/13  Blood Culture (routine x 2)     Status: None   Collection Time: 07/27/2015  8:51 AM  Result Value Ref Range Status   Specimen Description BLOOD UNKO  Final   Special Requests BOTTLES DRAWN AEROBIC AND ANAEROBIC  3CC  Final   Culture NO GROWTH 5 DAYS  Final   Report Status 08/17/2015 FINAL  Final  Blood Culture (routine x 2)     Status: None   Collection Time: 08/11/2015  9:20 AM  Result Value Ref Range Status   Specimen Description BLOOD LEFT ARM  Final   Special Requests BOTTLES DRAWN AEROBIC AND ANAEROBIC  1CC  Final   Culture NO GROWTH 5 DAYS  Final   Report Status 08/17/2015 FINAL  Final  Wound culture     Status: None   Collection Time: 08/19/2015  9:20 AM  Result Value Ref Range Status   Specimen Description DECUBITIS  Final   Special Requests Normal  Final   Gram Stain   Final    FEW WBC SEEN MANY GRAM NEGATIVE RODS RARE GRAM POSITIVE COCCI    Culture   Final    HEAVY GROWTH ESCHERICHIA  COLI MODERATE GROWTH ENTEROBACTER AEROGENES PROTEUS MIRABILIS HEAVY GROWTH ENTEROCOCCUS SPECIES VRE HAVE INTRINSIC RESISTANCE TO MOST COMMONLY USED ANTIBIOTICS AND THE ABILITY TO ACQUIRE RESISTANCE TO MOST AVAILABLE ANTIBIOTICS.    Report Status 08/16/2015 FINAL  Final   Organism ID, Bacteria ESCHERICHIA COLI  Final   Organism ID, Bacteria ENTEROBACTER AEROGENES  Final   Organism ID, Bacteria PROTEUS MIRABILIS  Final   Organism ID, Bacteria ENTEROCOCCUS SPECIES  Final      Susceptibility   Enterobacter aerogenes - MIC*    CEFTAZIDIME <=1 SENSITIVE Sensitive     CEFAZOLIN >=64 RESISTANT Resistant     CEFTRIAXONE <=1 SENSITIVE Sensitive     CIPROFLOXACIN <=0.25 SENSITIVE Sensitive     GENTAMICIN <=1 SENSITIVE Sensitive     IMIPENEM 1 SENSITIVE Sensitive     TRIMETH/SULFA <=20 SENSITIVE Sensitive     * MODERATE GROWTH ENTEROBACTER AEROGENES   Escherichia coli - MIC*    AMPICILLIN <=2 SENSITIVE Sensitive     CEFTAZIDIME <=1 SENSITIVE Sensitive     CEFAZOLIN <=4 SENSITIVE Sensitive     CEFTRIAXONE <=1 SENSITIVE Sensitive     CIPROFLOXACIN <=0.25 SENSITIVE Sensitive     GENTAMICIN <=1 SENSITIVE Sensitive     IMIPENEM <=0.25 SENSITIVE Sensitive     TRIMETH/SULFA <=20 SENSITIVE Sensitive     * HEAVY GROWTH ESCHERICHIA COLI   Proteus mirabilis - MIC*    AMPICILLIN >=32 RESISTANT Resistant     CEFTAZIDIME <=1 SENSITIVE Sensitive     CEFAZOLIN 8 SENSITIVE Sensitive     CEFTRIAXONE <=1 SENSITIVE Sensitive     CIPROFLOXACIN <=0.25 SENSITIVE Sensitive     GENTAMICIN <=1 SENSITIVE Sensitive     IMIPENEM 1 SENSITIVE Sensitive     TRIMETH/SULFA <=20 SENSITIVE Sensitive     * PROTEUS MIRABILIS   Enterococcus species - MIC*    AMPICILLIN >=32 RESISTANT Resistant     VANCOMYCIN >=32 RESISTANT Resistant     GENTAMICIN SYNERGY SENSITIVE Sensitive     TETRACYCLINE Value in next row Resistant      RESISTANT>=16    * HEAVY GROWTH ENTEROCOCCUS SPECIES  MRSA PCR Screening     Status: None    Collection Time: 08/09/2015  2:38 PM  Result Value Ref Range Status   MRSA by PCR NEGATIVE NEGATIVE Final    Comment:  The GeneXpert MRSA Assay (FDA approved for NASAL specimens only), is one component of a comprehensive MRSA colonization surveillance program. It is not intended to diagnose MRSA infection nor to guide or monitor treatment for MRSA infections.   Blood culture (single)     Status: None   Collection Time: 2015-09-09  3:36 PM  Result Value Ref Range Status   Specimen Description BLOOD RIGHT ASSIST CONTROL  Final   Special Requests BOTTLES DRAWN AEROBIC AND ANAEROBIC  Final   Culture NO GROWTH 5 DAYS  Final   Report Status 08/17/2015 FINAL  Final  Wound culture     Status: None   Collection Time: 08/13/15 12:37 PM  Result Value Ref Range Status   Specimen Description WOUND  Final   Special Requests Normal  Final   Gram Stain   Final    FEW WBC SEEN TOO NUMEROUS TO COUNT GRAM NEGATIVE RODS FEW GRAM POSITIVE COCCI    Culture   Final    HEAVY GROWTH ESCHERICHIA COLI MODERATE GROWTH PROTEUS MIRABILIS LIGHT GROWTH KLEBSIELLA PNEUMONIAE MODERATE GROWTH ENTEROCOCCUS GALLINARUM CRITICAL RESULT CALLED TO, READ BACK BY AND VERIFIED WITH: [facility] BORBA AT 1042 08/16/15 DV    Report Status 08/17/2015 FINAL  Final   Organism ID, Bacteria ESCHERICHIA COLI  Final   Organism ID, Bacteria PROTEUS MIRABILIS  Final   Organism ID, Bacteria KLEBSIELLA PNEUMONIAE  Final   Organism ID, Bacteria ENTEROCOCCUS GALLINARUM  Final      Susceptibility   Escherichia coli - MIC*    AMPICILLIN >=32 RESISTANT Resistant     CEFTAZIDIME 4 RESISTANT Resistant     CEFAZOLIN >=64 RESISTANT Resistant     CEFTRIAXONE 16 RESISTANT Resistant     GENTAMICIN 2 SENSITIVE Sensitive     IMIPENEM >=16 RESISTANT Resistant     TRIMETH/SULFA <=20 SENSITIVE Sensitive     Extended ESBL POSITIVE Resistant     PIP/TAZO Value in next row Resistant      RESISTANT>=128    CIPROFLOXACIN Value in next row  Sensitive      SENSITIVE<=0.25    * HEAVY GROWTH ESCHERICHIA COLI   Klebsiella pneumoniae - MIC*    AMPICILLIN Value in next row Resistant      SENSITIVE<=0.25    CEFTAZIDIME Value in next row Resistant      SENSITIVE<=0.25    CEFAZOLIN Value in next row Resistant      SENSITIVE<=0.25    CEFTRIAXONE Value in next row Resistant      SENSITIVE<=0.25    CIPROFLOXACIN Value in next row Resistant      SENSITIVE<=0.25    GENTAMICIN Value in next row Sensitive      SENSITIVE<=0.25    IMIPENEM Value in next row Resistant      SENSITIVE<=0.25    TRIMETH/SULFA Value in next row Resistant      SENSITIVE<=0.25    PIP/TAZO Value in next row Resistant      RESISTANT>=128    * LIGHT GROWTH KLEBSIELLA PNEUMONIAE   Proteus mirabilis - MIC*    AMPICILLIN Value in next row Resistant      RESISTANT>=128    CEFTAZIDIME Value in next row Sensitive      RESISTANT>=128    CEFAZOLIN Value in next row Sensitive      RESISTANT>=128    CEFTRIAXONE Value in next row Sensitive      RESISTANT>=128    CIPROFLOXACIN Value in next row Sensitive      RESISTANT>=128    GENTAMICIN Value in next  row Sensitive      RESISTANT>=128    IMIPENEM Value in next row Sensitive      RESISTANT>=128    TRIMETH/SULFA Value in next row Sensitive      RESISTANT>=128    PIP/TAZO Value in next row Sensitive      SENSITIVE<=4    * MODERATE GROWTH PROTEUS MIRABILIS   Enterococcus gallinarum - MIC*    AMPICILLIN Value in next row Resistant      SENSITIVE<=4    GENTAMICIN SYNERGY Value in next row Sensitive      SENSITIVE<=4    CIPROFLOXACIN Value in next row Resistant      RESISTANT>=8    TETRACYCLINE Value in next row Resistant      RESISTANT>=16    * MODERATE GROWTH ENTEROCOCCUS GALLINARUM  Wound culture     Status: None   Collection Time: 08/15/15  2:53 PM  Result Value Ref Range Status   Specimen Description WOUND  Final   Special Requests NONE  Final   Gram Stain FEW WBC SEEN NO ORGANISMS SEEN   Final    Culture NO GROWTH 3 DAYS  Final   Report Status 08/18/2015 FINAL  Final  C difficile quick scan w PCR reflex     Status: None   Collection Time: 08/17/15 11:34 AM  Result Value Ref Range Status   C Diff antigen NEGATIVE NEGATIVE Final   C Diff toxin NEGATIVE NEGATIVE Final   C Diff interpretation Negative for C. difficile  Final    Coagulation Studies: No results for input(s): LABPROT, INR in the last 72 hours.  Urinalysis: No results for input(s): COLORURINE, LABSPEC, PHURINE, GLUCOSEU, HGBUR, BILIRUBINUR, KETONESUR, PROTEINUR, UROBILINOGEN, NITRITE, LEUKOCYTESUR in the last 72 hours.  Invalid input(s): APPERANCEUR    Imaging: US Venous Img Lower Bilateral  08/28/2015   CLINICAL DATA:  Bilateral lower extremity swelling, 2 week duration. Some pain. Previous history deep venous thrombosis.  EXAM: BILATERAL LOWER EXTREMITY VENOUS DOPPLER ULTRASOUND  TECHNIQUE: Gray-scale sonography with graded compression, as well as color Doppler and duplex ultrasound were performed to evaluate the lower extremity deep venous systems from the level of the common femoral vein and including the common femoral, femoral, profunda femoral, popliteal and calf veins including the posterior tibial, peroneal and gastrocnemius veins when visible. The superficial great saphenous vein was also interrogated. Spectral Doppler was utilized to evaluate flow at rest and with distal augmentation maneuvers in the common femoral, femoral and popliteal veins.  COMPARISON:  None.  FINDINGS: RIGHT LOWER EXTREMITY  Common Femoral Vein: No evidence of thrombus. Normal compressibility, respiratory phasicity and response to augmentation.  Saphenofemoral Junction: No evidence of thrombus. Normal compressibility and flow on color Doppler imaging.  Profunda Femoral Vein: No evidence of thrombus. Normal compressibility and flow on color Doppler imaging.  Femoral Vein: No evidence of thrombus. Normal compressibility, respiratory phasicity and  response to augmentation.  Popliteal Vein: No evidence of thrombus. Normal compressibility, respiratory phasicity and response to augmentation.  Calf Veins: Poorly seen because of adjacent arterial calcification  Venous Reflux:  None.  Other Findings:  None.  LEFT LOWER EXTREMITY  Common Femoral Vein: No evidence of thrombus. Normal compressibility, respiratory phasicity and response to augmentation.  Saphenofemoral Junction: No evidence of thrombus. Normal compressibility and flow on color Doppler imaging.  Profunda Femoral Vein: No evidence of thrombus. Normal compressibility and flow on color Doppler imaging.  Femoral Vein: No evidence of thrombus. Normal compressibility, respiratory phasicity and response to augmentation.  Popliteal Vein: No  evidence of thrombus. Normal compressibility, respiratory phasicity and response to augmentation.  Calf Veins: Poorly seen because of adjacent arterial calcification.  Venous Reflux:  None.  Other Findings:  None.  IMPRESSION: No evidence of deep venous thrombosis. The calf veins are poorly seen on each side because of adjacent arterial calcification. No DVT suspected.   Electronically Signed   By: Paulina Fusi M.D.   On: 08/28/2015 13:25     Medications:   . feeding supplement (JEVITY 1.5 CAL/FIBER) 1,000 mL (08/29/15 1232)  . pureflow 3 each (08/25/15 0826)   . acidophilus  1 capsule Oral Daily  . amantadine  200 mg Oral Q7 days  . antiseptic oral rinse  7 mL Mouth Rinse q12n4p  . aspirin  324 mg Oral Daily  . budesonide (PULMICORT) nebulizer solution  0.5 mg Nebulization BID  . chlorhexidine  15 mL Mouth Rinse BID  . DAPTOmycin (CUBICIN)  IV  500 mg Intravenous Q48H  . epoetin (EPOGEN/PROCRIT) injection  10,000 Units Intravenous Q T,Th,Sa-HD  . feeding supplement (PRO-STAT SUGAR FREE 64)  30 mL Oral 6 times per day  . free water  100 mL Per Tube 3 times per day  . hydrocortisone  10 mg Per Tube BID  . insulin aspart  0-15 Units Subcutaneous 6 times per  day  . ipratropium-albuterol  3 mL Nebulization BID  . lidocaine  1 patch Transdermal Q24H  . meropenem (MERREM) IV  500 mg Intravenous q1800  . midodrine  10 mg Oral TID WC  . multivitamin  5 mL Per Tube Daily  . nystatin   Topical TID  . pantoprazole sodium  40 mg Per Tube Q1200  . sodium hypochlorite   Irrigation BID   sodium chloride, sodium chloride, acetaminophen (TYLENOL) oral liquid 160 mg/5 mL, alteplase, anticoagulant sodium citrate, lidocaine (PF), lidocaine-prilocaine, loperamide, morphine injection, pentafluoroprop-tetrafluoroeth  Assessment/ Plan:  49 y.o. 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  1. End-stage renal disease on hemodialysis on HD TTHS. The patient has been on dialysis since October of 2014. R IJ permcath.  - Pt due for HD today, will prepare orders, BP acceptable, will continue midodrine for now.   2. Anemia of CKD: status post PRBC transfusions. - continue epogen 10000 units IV with HD.   3. Secondary hyperparathyroidism: PTH low at 55. - phos currently 3.7, no need for binder therapy.  4. Hypotension: off vassopressors.  - BP good this AM predialysis, will continue midodrine at this time.   LOS: 18 Juleen Sorrels 10/11/20166:37 AM

## 2015-08-30 NOTE — Progress Notes (Signed)
ELECTROLYTE MANAGEMENT - FOLLOW UP   Pharmacy Consult for Electrolyte Management Indication: electrolyte management   Allergies  Allergen Reactions  . Contrast Media [Iodinated Diagnostic Agents] Anaphylaxis  . Ampicillin Rash    Patient Measurements: Height:  (175.3 cm) Weight: 191 lb 12.8 oz (87 kg) IBW/kg (Calculated) : 66.2   Vital Signs: Temp: 98.7 F (37.1 C) (10/11 0226) Temp Source: Oral (10/11 0226) BP: 154/54 mmHg (10/11 0600) Pulse Rate: 97 (10/11 0600) Intake/Output from previous day: 10/10 0701 - 10/11 0700 In: 710 [NG/GT:660; IV Piggyback:50] Out: -  Intake/Output from this shift: Total I/O In: 330 [NG/GT:330] Out: -   Labs:  Recent Labs  08/28/15 0340 08/28/15 1040 08/28/15 1442 08/29/15 0511 08/30/15 0435  WBC  --  8.9 10.3  --   --   HGB  --  9.3* 8.7*  --   --   HCT  --  28.7* 26.9*  --   --   PLT  --  79* 82*  --   --   APTT 30  --   --  31 32     Recent Labs  08/28/15 0340 08/28/15 1442 08/29/15 0511 08/30/15 0435  NA 143 140 139 140  K 3.1* 4.1 3.7 3.3*  CL 117* 108 105 108  CO2 GLUCOSE 89 135* 89 120*  BUN 21* 32* 23* 37*  CREATININE 0.74 1.13* 0.81 1.09*  CALCIUM 6.3* 7.9* 7.9* 7.5*  MG 1.6*  --  1.9 2.1  PHOS 2.6 3.6 2.4* 3.7  ALBUMIN  --  1.9*  --   --    Estimated Creatinine Clearance: 72.6 mL/min (by C-G formula based on Cr of 1.09).    Recent Labs  08/29/15 1943 08/30/15 0007 08/30/15 0420  GLUCAP 114* 108* 117*    Medical History: Past Medical History  Diagnosis Date  . Obesity   . Dyslipidemia   . Hypertension   . Coronary artery disease     s/p BMS 2010 LAD  . Dysrhythmia     ventricular tachycardia resolved after LAD stent and beta blocker  . Diabetes mellitus     with retinopathy, neuropathy and microalbuminemia  . ESRD (end stage renal disease) on dialysis     Medications:  Scheduled:  . acidophilus  1 capsule Oral Daily  . amantadine  200 mg Oral Q7 days  . antiseptic  oral rinse  7 mL Mouth Rinse q12n4p  . aspirin  324 mg Oral Daily  . budesonide (PULMICORT) nebulizer solution  0.5 mg Nebulization BID  . chlorhexidine  15 mL Mouth Rinse BID  . DAPTOmycin (CUBICIN)  IV  500 mg Intravenous Q48H  . epoetin (EPOGEN/PROCRIT) injection  10,000 Units Intravenous Q T,Th,Sa-HD  . feeding supplement (PRO-STAT SUGAR FREE 64)  30 mL Oral 6 times per day  . free water  100 mL Per Tube 3 times per day  . hydrocortisone  10 mg Per Tube BID  . insulin aspart  0-15 Units Subcutaneous 6 times per day  . ipratropium-albuterol  3 mL Nebulization BID  . lidocaine  1 patch Transdermal Q24H  . meropenem (MERREM) IV  500 mg Intravenous q1800  . midodrine  10 mg Oral TID WC  . multivitamin  5 mL Per Tube Daily  . nystatin   Topical TID  . pantoprazole sodium  40 mg Per Tube Q1200  . potassium chloride  10 mEq Intravenous Q1 Hr x 3  . sodium hypochlorite   Irrigation BID  Assessment: 49 yo female admitted for sepsis, found to have hypokalemia. Pharmacy consulted for electrolyte management.  CCa: 9.4  Plan:  Magnesium is low today at 1.6. Will replace with magnesium sulfate 2 g iv once and f/u am labs.    Pharmacy will continue to monitor and adjust per consult.   10/9 AM K+ 3.1, Ca 6.3 (corr. 7.9) and Mg 1.6. 40 mEq KCl, 1 gram calcium gluconate, and 2 grams magnesium sulfate ordered. Recheck in AM.  10/10 PO4 2.4. Neutra-phos 250 mg PO x 2 doses ordered. Recheck in AM.  1011 AM labs WNL except potassium, ordered KCl 10 mEq IV x 3, will recheck labs in AM.    Carola Frost, Pharm.D. Clinical Pharmacist 08/30/2015,6:59 AM

## 2015-08-30 NOTE — Progress Notes (Signed)
Pre-hd tx 

## 2015-08-30 NOTE — Progress Notes (Signed)
MEDICATION RELATED CONSULT NOTE - FOLLOW UP   Pharmacy Consult for Meropenem/Daptomycin Indication: Sepsis/Sacral decubitus   Allergies  Allergen Reactions  . Contrast Media [Iodinated Diagnostic Agents] Anaphylaxis  . Ampicillin Rash    Patient Measurements: Height: 5\' 9"  (175.3 cm) Weight: 191 lb 12.8 oz (87 kg) IBW/kg (Calculated) : 66.2   Vital Signs: Temp: 98.5 F (36.9 C) (10/11 1122) Temp Source: Axillary (10/11 1122) BP: 154/54 mmHg (10/11 0600) Pulse Rate: 97 (10/11 0600) Intake/Output from previous day: 10/10 0701 - 10/11 0700 In: 710 [NG/GT:660; IV Piggyback:50] Out: -     Labs:  Recent Labs  08/28/15 0340 08/28/15 1040 08/28/15 1442 08/29/15 0511 08/30/15 0435  WBC  --  8.9 10.3  --   --   HGB  --  9.3* 8.7*  --   --   HCT  --  28.7* 26.9*  --   --   PLT  --  79* 82*  --   --   APTT 30  --   --  31 32  CREATININE 0.74  --  1.13* 0.81 1.09*  MG 1.6*  --   --  1.9 2.1  PHOS 2.6  --  3.6 2.4* 3.7  ALBUMIN  --   --  1.9*  --   --    Estimated Creatinine Clearance: 72.6 mL/min (by C-G formula based on Cr of 1.09).   Microbiology: Recent Results (from the past 720 hour(s))  Culture, blood (routine x 2)     Status: None   Collection Time: 08/01/15  7:00 PM  Result Value Ref Range Status   Specimen Description BLOOD RIGHT ANTECUBITAL  Final   Special Requests BOTTLES DRAWN AEROBIC ONLY 5CC  Final   Culture NO GROWTH 5 DAYS  Final   Report Status 08/06/2015 FINAL  Final  Culture, blood (routine x 2)     Status: None   Collection Time: 08/01/15  7:10 PM  Result Value Ref Range Status   Specimen Description BLOOD RIGHT HAND  Final   Special Requests IN PEDIATRIC BOTTLE 3CC  Final   Culture  Setup Time   Final    GRAM POSITIVE COCCI IN CLUSTERS AEROBIC BOTTLE ONLY CRITICAL RESULT CALLED TO, READ BACK BY AND VERIFIED WITH: D TAYLOR,RN AT 1115 08/02/15 BY L BENFIELD    Culture STAPHYLOCOCCUS SPECIES (COAGULASE NEGATIVE)  Final   Report Status  08/06/2015 FINAL  Final   Organism ID, Bacteria STAPHYLOCOCCUS SPECIES (COAGULASE NEGATIVE)  Final      Susceptibility   Staphylococcus species (coagulase negative) - MIC*    CIPROFLOXACIN >=8 RESISTANT Resistant     ERYTHROMYCIN <=0.25 SENSITIVE Sensitive     GENTAMICIN 8 INTERMEDIATE Intermediate     OXACILLIN >=4 RESISTANT Resistant     TETRACYCLINE <=1 SENSITIVE Sensitive     VANCOMYCIN 1 SENSITIVE Sensitive     TRIMETH/SULFA 160 RESISTANT Resistant     CLINDAMYCIN <=0.25 SENSITIVE Sensitive     RIFAMPIN <=0.5 SENSITIVE Sensitive     Inducible Clindamycin NEGATIVE Sensitive     * STAPHYLOCOCCUS SPECIES (COAGULASE NEGATIVE)  Culture, blood (routine x 2)     Status: None   Collection Time: 08/02/15  7:00 PM  Result Value Ref Range Status   Specimen Description BLOOD RIGHT ANTECUBITAL  Final   Special Requests BOTTLES DRAWN AEROBIC ONLY 10CC  Final   Culture  Setup Time   Final    GRAM POSITIVE COCCI IN CLUSTERS AEROBIC BOTTLE ONLY CRITICAL RESULT CALLED TO, READ BACK BY AND  VERIFIED WITH: DR Gwenevere Abbot RN 1549 08/03/15 A BROWNING    Culture   Final    STAPHYLOCOCCUS SPECIES (COAGULASE NEGATIVE) SUSCEPTIBILITIES PERFORMED ON PREVIOUS CULTURE WITHIN THE LAST 5 DAYS.    Report Status 08/06/2015 FINAL  Final  Culture, blood (routine x 2)     Status: None   Collection Time: 08/02/15  7:05 PM  Result Value Ref Range Status   Specimen Description BLOOD RIGHT HAND  Final   Special Requests BOTTLES DRAWN AEROBIC AND ANAEROBIC 10CC  Final   Culture NO GROWTH 5 DAYS  Final   Report Status 08/07/2015 FINAL  Final  Blood Culture (routine x 2)     Status: None   Collection Time: 2015/09/11  8:51 AM  Result Value Ref Range Status   Specimen Description BLOOD UNKO  Final   Special Requests BOTTLES DRAWN AEROBIC AND ANAEROBIC  3CC  Final   Culture NO GROWTH 5 DAYS  Final   Report Status 08/17/2015 FINAL  Final  Blood Culture (routine x 2)     Status: None   Collection Time: 11-Sep-2015  9:20 AM   Result Value Ref Range Status   Specimen Description BLOOD LEFT ARM  Final   Special Requests BOTTLES DRAWN AEROBIC AND ANAEROBIC  1CC  Final   Culture NO GROWTH 5 DAYS  Final   Report Status 08/17/2015 FINAL  Final  Wound culture     Status: None   Collection Time: 2015/09/11  9:20 AM  Result Value Ref Range Status   Specimen Description DECUBITIS  Final   Special Requests Normal  Final   Gram Stain   Final    FEW WBC SEEN MANY GRAM NEGATIVE RODS RARE GRAM POSITIVE COCCI    Culture   Final    HEAVY GROWTH ESCHERICHIA COLI MODERATE GROWTH ENTEROBACTER AEROGENES PROTEUS MIRABILIS HEAVY GROWTH ENTEROCOCCUS SPECIES VRE HAVE INTRINSIC RESISTANCE TO MOST COMMONLY USED ANTIBIOTICS AND THE ABILITY TO ACQUIRE RESISTANCE TO MOST AVAILABLE ANTIBIOTICS.    Report Status 08/16/2015 FINAL  Final   Organism ID, Bacteria ESCHERICHIA COLI  Final   Organism ID, Bacteria ENTEROBACTER AEROGENES  Final   Organism ID, Bacteria PROTEUS MIRABILIS  Final   Organism ID, Bacteria ENTEROCOCCUS SPECIES  Final      Susceptibility   Enterobacter aerogenes - MIC*    CEFTAZIDIME <=1 SENSITIVE Sensitive     CEFAZOLIN >=64 RESISTANT Resistant     CEFTRIAXONE <=1 SENSITIVE Sensitive     CIPROFLOXACIN <=0.25 SENSITIVE Sensitive     GENTAMICIN <=1 SENSITIVE Sensitive     IMIPENEM 1 SENSITIVE Sensitive     TRIMETH/SULFA <=20 SENSITIVE Sensitive     * MODERATE GROWTH ENTEROBACTER AEROGENES   Escherichia coli - MIC*    AMPICILLIN <=2 SENSITIVE Sensitive     CEFTAZIDIME <=1 SENSITIVE Sensitive     CEFAZOLIN <=4 SENSITIVE Sensitive     CEFTRIAXONE <=1 SENSITIVE Sensitive     CIPROFLOXACIN <=0.25 SENSITIVE Sensitive     GENTAMICIN <=1 SENSITIVE Sensitive     IMIPENEM <=0.25 SENSITIVE Sensitive     TRIMETH/SULFA <=20 SENSITIVE Sensitive     * HEAVY GROWTH ESCHERICHIA COLI   Proteus mirabilis - MIC*    AMPICILLIN >=32 RESISTANT Resistant     CEFTAZIDIME <=1 SENSITIVE Sensitive     CEFAZOLIN 8 SENSITIVE  Sensitive     CEFTRIAXONE <=1 SENSITIVE Sensitive     CIPROFLOXACIN <=0.25 SENSITIVE Sensitive     GENTAMICIN <=1 SENSITIVE Sensitive     IMIPENEM 1 SENSITIVE Sensitive  TRIMETH/SULFA <=20 SENSITIVE Sensitive     * PROTEUS MIRABILIS   Enterococcus species - MIC*    AMPICILLIN >=32 RESISTANT Resistant     VANCOMYCIN >=32 RESISTANT Resistant     GENTAMICIN SYNERGY SENSITIVE Sensitive     TETRACYCLINE Value in next row Resistant      RESISTANT>=16    * HEAVY GROWTH ENTEROCOCCUS SPECIES  MRSA PCR Screening     Status: None   Collection Time: August 28, 2015  2:38 PM  Result Value Ref Range Status   MRSA by PCR NEGATIVE NEGATIVE Final    Comment:        The GeneXpert MRSA Assay (FDA approved for NASAL specimens only), is one component of a comprehensive MRSA colonization surveillance program. It is not intended to diagnose MRSA infection nor to guide or monitor treatment for MRSA infections.   Blood culture (single)     Status: None   Collection Time: 08-28-2015  3:36 PM  Result Value Ref Range Status   Specimen Description BLOOD RIGHT ASSIST CONTROL  Final   Special Requests BOTTLES DRAWN AEROBIC AND ANAEROBIC  Final   Culture NO GROWTH 5 DAYS  Final   Report Status 08/17/2015 FINAL  Final  Wound culture     Status: None   Collection Time: 08/13/15 12:37 PM  Result Value Ref Range Status   Specimen Description WOUND  Final   Special Requests Normal  Final   Gram Stain   Final    FEW WBC SEEN TOO NUMEROUS TO COUNT GRAM NEGATIVE RODS FEW GRAM POSITIVE COCCI    Culture   Final    HEAVY GROWTH ESCHERICHIA COLI MODERATE GROWTH PROTEUS MIRABILIS LIGHT GROWTH KLEBSIELLA PNEUMONIAE MODERATE GROWTH ENTEROCOCCUS GALLINARUM CRITICAL RESULT CALLED TO, READ BACK BY AND VERIFIED WITH: [facility] BORBA AT 1042 08/16/15 DV    Report Status 08/17/2015 FINAL  Final   Organism ID, Bacteria ESCHERICHIA COLI  Final   Organism ID, Bacteria PROTEUS MIRABILIS  Final   Organism ID, Bacteria  KLEBSIELLA PNEUMONIAE  Final   Organism ID, Bacteria ENTEROCOCCUS GALLINARUM  Final      Susceptibility   Escherichia coli - MIC*    AMPICILLIN >=32 RESISTANT Resistant     CEFTAZIDIME 4 RESISTANT Resistant     CEFAZOLIN >=64 RESISTANT Resistant     CEFTRIAXONE 16 RESISTANT Resistant     GENTAMICIN 2 SENSITIVE Sensitive     IMIPENEM >=16 RESISTANT Resistant     TRIMETH/SULFA <=20 SENSITIVE Sensitive     Extended ESBL POSITIVE Resistant     PIP/TAZO Value in next row Resistant      RESISTANT>=128    CIPROFLOXACIN Value in next row Sensitive      SENSITIVE<=0.25    * HEAVY GROWTH ESCHERICHIA COLI   Klebsiella pneumoniae - MIC*    AMPICILLIN Value in next row Resistant      SENSITIVE<=0.25    CEFTAZIDIME Value in next row Resistant      SENSITIVE<=0.25    CEFAZOLIN Value in next row Resistant      SENSITIVE<=0.25    CEFTRIAXONE Value in next row Resistant      SENSITIVE<=0.25    CIPROFLOXACIN Value in next row Resistant      SENSITIVE<=0.25    GENTAMICIN Value in next row Sensitive      SENSITIVE<=0.25    IMIPENEM Value in next row Resistant      SENSITIVE<=0.25    TRIMETH/SULFA Value in next row Resistant      SENSITIVE<=0.25    PIP/TAZO  Value in next row Resistant      RESISTANT>=128    * LIGHT GROWTH KLEBSIELLA PNEUMONIAE   Proteus mirabilis - MIC*    AMPICILLIN Value in next row Resistant      RESISTANT>=128    CEFTAZIDIME Value in next row Sensitive      RESISTANT>=128    CEFAZOLIN Value in next row Sensitive      RESISTANT>=128    CEFTRIAXONE Value in next row Sensitive      RESISTANT>=128    CIPROFLOXACIN Value in next row Sensitive      RESISTANT>=128    GENTAMICIN Value in next row Sensitive      RESISTANT>=128    IMIPENEM Value in next row Sensitive      RESISTANT>=128    TRIMETH/SULFA Value in next row Sensitive      RESISTANT>=128    PIP/TAZO Value in next row Sensitive      SENSITIVE<=4    * MODERATE GROWTH PROTEUS MIRABILIS   Enterococcus gallinarum  - MIC*    AMPICILLIN Value in next row Resistant      SENSITIVE<=4    GENTAMICIN SYNERGY Value in next row Sensitive      SENSITIVE<=4    CIPROFLOXACIN Value in next row Resistant      RESISTANT>=8    TETRACYCLINE Value in next row Resistant      RESISTANT>=16    * MODERATE GROWTH ENTEROCOCCUS GALLINARUM  Wound culture     Status: None   Collection Time: 08/15/15  2:53 PM  Result Value Ref Range Status   Specimen Description WOUND  Final   Special Requests NONE  Final   Gram Stain FEW WBC SEEN NO ORGANISMS SEEN   Final   Culture NO GROWTH 3 DAYS  Final   Report Status 08/18/2015 FINAL  Final  C difficile quick scan w PCR reflex     Status: None   Collection Time: 08/17/15 11:34 AM  Result Value Ref Range Status   C Diff antigen NEGATIVE NEGATIVE Final   C Diff toxin NEGATIVE NEGATIVE Final   C Diff interpretation Negative for C. difficile  Final    Assessment: 49 yo female admitted to ICU with septic shock and stage IV sacral decubitus. Pharmacy consulted for dosing and monitoring of meropenem and Daptomycin. Patient has ESRD and schuduled to receive HD today. 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 Patient remains afebrile.  Daptomycin originally started on 9/28 (day 14 of treatment) and Meropenem 9/24 (day 18 of treatment). No evidence of muscle injury at this time, CK was 15.   Plan:  Will continue patients current regimen of meropenum  IV daily and Daptomycin    ( /kg) q48 hours. Will order and monitor weekly CPK levels while patient is on  Daptomycin .  Pharmacy will continue to monitor renal function and make adjustments as needed.Cher Nakai, PharmD Pharmacy Resident

## 2015-08-30 NOTE — Progress Notes (Addendum)
Nutrition Follow-up  DOCUMENTATION CODES:   Severe malnutrition in context of acute illness/injury  INTERVENTION:   EN: recommend increasing Jevity 1.5 TF to rate of 40 ml/hr, continue Prostat 6 times daily. Liquid stool continues; pt may benefit from switching formula to the Vital products as these are recommended in the cases of TF-associated diarrhea. Noted pt receiving acidophilus, has order for imodium prn. Will continue to assess Coordination of Care: discussed nutritional poc during ICU rounds; noted plan for discharge back to [facility]. Per Care Management, pt cannot discharge back to [facility] with Dobhoff tube in place. At present, pt not able to participate in SLP eval for diet progression due to mental status. Also, in writer's professional opinion, it is not likely that pt would be able to meet nutritional needs, in particular her protein needs necessary for wound healing (in addition to increased needs associated with dialysis, chronic illness, etc) by oral intake alone. MD plans to speak with husband regarding feeding issues, possibility of G-tube placement. Will continue to assess   NUTRITION DIAGNOSIS:   Inadequate oral intake related to wound healing, acute illness, chronic illness as evidenced by NPO status, estimated needs.  GOAL:   Patient will meet greater than or equal to 90% of their needs  MONITOR:    (Energy Intake, Anthropometrics, Digestive System, Electrolyte/Renal Profile, Glucose Profile)  REASON FOR ASSESSMENT:   Consult Enteral/tube feeding initiation and management  ASSESSMENT:    Pt remains on trach collar, plan for HD today, noted per MD notes, pt is alert, pt opens eyes, per Elnita Maxwell RN, pt is not alert and oriented enough to participate in SLP swallow eval for possible diet progression at this time.    Diet Order:  Diet NPO time specified   EN: tolerating Jevity 1.5 TF at rate of 30 ml/hr  Skin:   (stage IV sacrum, stage III hip,  unstageable foot; rectal ulcer from fleixseal, ulcer in nose from dobhoff)   Digestive System: liquid stool continues, no signs of active bleeding, no vomitting  Electrolyte and Renal Profile:  Recent Labs Lab 08/28/15 0340 08/28/15 1442 08/29/15 0511 08/30/15 0435  BUN 21* 32* 23* 37*  CREATININE 0.74 1.13* 0.81 1.09*  NA 143 140 139 140  K 3.1* 4.1 3.7 3.3*  MG 1.6*  --  1.9 2.1  PHOS 2.6 3.6 2.4* 3.7   Glucose Profile:  Recent Labs  08/30/15 0420 08/30/15 0801 08/30/15 1226  GLUCAP 117* 105* 102*   Nutritional Anemia Profile:  CBC Latest Ref Rng 08/28/2015 08/28/2015 08/27/2015  WBC 3.6 - 11.0 K/uL 10.3 8.9 9.9  Hemoglobin 12.0 - 16.0 g/dL 1.4(N) 8.2(N) 5.6(O)  Hematocrit 35.0 - 47.0 % 26.9(L) 28.7(L) 26.5(L)  Platelets 150 - 440 K/uL 82(L) 79(L) 62(L)    Meds: reviewed  Height:   Ht Readings from Last 1 Encounters:  08/03/2015  (1.753 m)    Weight:   Wt Readings from Last 1 Encounters:  08/30/15 191 lb 12.8 oz (87 kg)   Filed Weights   08/28/15 0500 08/29/15 0600 08/30/15 0500  Weight: 182 lb 15.7 oz (83 kg) 185 lb 13.6 oz (84.3 kg) 191 lb 12.8 oz (87 kg)    BMI:  Body mass index is 28.31 kg/(m^2).  Estimated Nutritional Needs:   Kcal:  1308-6578 kcals (BEE 1434, 1.2 AF, 1.2-1.4 IF)   Protein:  113-150 g/kg (1.5-2.0 g/kg) but may be closer to 150-188 g (2.0-2.5 g/kg)   Fluid:  1000 mL plus UOP  EDUCATION NEEDS:  No education needs identified at this time  [place], [place], LDN [phone] Pager

## 2015-08-30 NOTE — Consult Note (Signed)
  NEUROLOGY NOTE  Sleepy, opens eyes, attempts to follow simple commands such as sticking tongue out and closing eyes.   Pupils 4mm and sluggish, weak R corneal, good L corneal,  Localizes on L, trace movement on R  A/P: 1. Multiple embolic acute infarcts- improved, Etiology still appears to be cardioembolic. Pt does have strong history of GI bleed as well as low plts I am not sure she would benefit from anticoagulation therefore at this point I would hold off TEE.  2. Encephalopathy-still fluctuating but overall better as following slight commands.   - Continue amantadine  BID PO  Call with questions.

## 2015-08-31 LAB — BASIC METABOLIC PANEL
ANION GAP: 5 (ref 5–15)
BUN: 29 mg/dL — ABNORMAL HIGH (ref 6–20)
CALCIUM: 7.9 mg/dL — AB (ref 8.9–10.3)
CHLORIDE: 104 mmol/L (ref 101–111)
CO2: 31 mmol/L (ref 22–32)
Creatinine, Ser: 0.84 mg/dL (ref 0.44–1.00)
GFR calc Af Amer: >60 mL/min (ref >60)
GFR calc non Af Amer: >60 mL/min (ref >60)
GLUCOSE: 134 mg/dL — AB (ref 65–99)
Potassium: 3.5 mmol/L (ref 3.5–5.1)
Sodium: 140 mmol/L (ref 135–145)

## 2015-08-31 LAB — GLUCOSE, CAPILLARY
GLUCOSE-CAPILLARY: 121 mg/dL — AB (ref 65–99)
GLUCOSE-CAPILLARY: 105 mg/dL — AB (ref 65–99)
Glucose-Capillary: 126 mg/dL — ABNORMAL HIGH (ref 65–99)
Glucose-Capillary: 123 mg/dL — ABNORMAL HIGH (ref 65–99)
Glucose-Capillary: 97 mg/dL (ref 65–99)

## 2015-08-31 LAB — MAGNESIUM: Magnesium: 1.9 mg/dL (ref 1.7–2.4)

## 2015-08-31 LAB — PHOSPHORUS: PHOSPHORUS: 2.8 mg/dL (ref 2.5–4.6)

## 2015-08-31 LAB — APTT: aPTT: 34 seconds (ref 24–36)

## 2015-08-31 MED ORDER — IPRATROPIUM-ALBUTEROL 0.5-2.5 (3) MG/3ML IN SOLN: 3.0000 mL | RESPIRATORY_TRACT | Status: DC | PRN

## 2015-08-31 NOTE — Progress Notes (Signed)
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 Intra-abdominal abscesses s/p lap LOA and drainage 03/2013. Subsequently has experienced numerous infections and hospitalizations since that time. Current hospitalization due to unresponsiveness, hypotension; admitted for septic shock likely due to infected sacral ulcer. 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 Later found to have GI bleed and rectal ulcer.      PT Comments    Pt appeared to be lethargic today and had decreased willingness to actively participate in therapy compared to her effort during the initial evaluation. Pt continues to demonstrate lack of ability to actively move limbs outside of slight active movement of cervical spine (flexion/ext, rotation) and of LUE  (elbox flexion/ext, slight wrist extension to neutral, slight shoulder flexion, slight composite digit flexion). The rest of the mobility performed today was passive in nature. Pt will continue to benefit from skilled acute PT services in order to continue passive motion of extremities to prevent contractures and encouragement of AROM in order to facilitate gains in strength and mobility.   Follow Up Recommendations  LTACH     Equipment Recommendations       Recommendations for Other Services       Precautions / Restrictions Precautions Precaution Comments: stage 4 sacral ulcer, NG tube/NPO, trach, contact isolation Restrictions Weight Bearing Restrictions: No    Mobility  Bed Mobility                General bed mobility comments: pt bed ridden; does not perform any active mobility outside of slight AROM of LUE and cervical ext/flex and rotation  Transfers Overall transfer level:  (no transfers performed due to pt's functional status)                  Ambulation/Gait Ambulation/Gait assistance:  (see above)               Stairs            Wheelchair Mobility    Modified Rankin (Stroke Patients Only)       Balance Overall balance assessment: No apparent balance deficits (not formally assessed)                                  Cognition Arousal/Alertness: Lethargic Behavior During Therapy:  (limited ability to participate) Overall Cognitive Status: Difficult to assess                      Exercises Total Joint Exercises Ankle Circles/Pumps: PROM;Both;15 reps;Supine Hip ABduction/ADduction: Both;10 reps;PROM;Supine Knee Flexion: PROM;Both;15 reps;Supine (knee + hip flexion) General Exercises - Upper Extremity Shoulder Flexion: PROM;Both;15 reps Shoulder ABduction: PROM;Both;10 reps;Supine Elbow Flexion: PROM;Both;10 reps;Supine Wrist Flexion: PROM;Both;10 reps;Supine Wrist Extension: PROM;Both;10 reps;Supine Digit Composite Flexion: PROM;Both;10 reps Other Exercises Other Exercises: shoulder internal/external rotation bilat PROM x 10, cervical AROM flex/ext x 5     General Comments        Pertinent Vitals/Pain Pain Assessment:  (pt unable to rate pain)    Home Living  Prior Function            PT Goals (current goals can now be found in the care plan section) Acute Rehab PT Goals Patient Stated Goal: no goal stated Additional Goals Additional Goal #1: Pt will demonstrate improved ability to perform AROM with LUE/LLE in order to decrease caregiver burden Progress towards PT goals: Not progressing toward goals - comment    Frequency  Min 2X/week    PT Plan Current plan remains  appropriate    Co-evaluation             End of Session   Activity Tolerance: Patient limited by fatigue;Patient limited by lethargy (pt limited by functional status and lack of motivation to perform therapy today) Patient left: in bed     Time: 1052-1106 PT Time Calculation (min) (ACUTE ONLY): 14 min  Charges:                       G CodesGeorgina [name]:      Courtney [name],SPT 08/31/2015, 12:15 PM

## 2015-08-31 NOTE — Progress Notes (Signed)
MEDICATION RELATED CONSULT NOTE - FOLLOW UP   Pharmacy Consult for Meropenem Indication: Sepsis/Sacral decubitus   Allergies  Allergen Reactions  . Contrast Media [Iodinated Diagnostic Agents] Anaphylaxis  . Ampicillin Rash    Patient Measurements: Height:  (175.3 cm) Weight: 185 lb 3 oz (84 kg) IBW/kg (Calculated) : 66.2   Vital Signs: Temp: 98.1 F (36.7 C) (10/12 1300) Temp Source: Axillary (10/12 1300) BP: 140/57 mmHg (10/12 1400) Pulse Rate: 93 (10/12 1100) Intake/Output from previous day: 10/11 0701 - 10/12 0700 In: 1520 [NG/GT:1060; IV Piggyback:460] Out: -461  Total I/O In: 160 [NG/GT:160] Out: -   Labs:  Recent Labs  08/28/15 1442 08/29/15 0511 08/30/15 0435 08/31/15 0434  WBC 10.3  --   --   --   HGB 8.7*  --   --   --   HCT 26.9*  --   --   --   PLT 82*  --   --   --   APTT  --  31 32 34  CREATININE 1.13* 0.81 1.09* 0.84  MG  --  1.9 2.1 1.9  PHOS 3.6 2.4* 3.7 2.8  ALBUMIN 1.9*  --   --   --    Estimated Creatinine Clearance: 92.7 mL/min (by C-G formula based on Cr of 0.84).   Microbiology: Recent Results (from the past 720 hour(s))  Culture, blood (routine x 2)     Status: None   Collection Time: 08/01/15  7:00 PM  Result Value Ref Range Status   Specimen Description BLOOD RIGHT ANTECUBITAL  Final   Special Requests BOTTLES DRAWN AEROBIC ONLY 5CC  Final   Culture NO GROWTH 5 DAYS  Final   Report Status 08/06/2015 FINAL  Final  Culture, blood (routine x 2)     Status: None   Collection Time: 08/01/15  7:10 PM  Result Value Ref Range Status   Specimen Description BLOOD RIGHT HAND  Final   Special Requests IN PEDIATRIC BOTTLE 3CC  Final   Culture  Setup Time   Final    GRAM POSITIVE COCCI IN CLUSTERS AEROBIC BOTTLE ONLY CRITICAL RESULT CALLED TO, READ BACK BY AND VERIFIED WITH: D TAYLOR,RN AT 1115 08/02/15 BY L BENFIELD    Culture STAPHYLOCOCCUS SPECIES (COAGULASE NEGATIVE)  Final   Report Status 08/06/2015 FINAL  Final   Organism  ID, Bacteria STAPHYLOCOCCUS SPECIES (COAGULASE NEGATIVE)  Final      Susceptibility   Staphylococcus species (coagulase negative) - MIC*    CIPROFLOXACIN >=8 RESISTANT Resistant     ERYTHROMYCIN <=0.25 SENSITIVE Sensitive     GENTAMICIN 8 INTERMEDIATE Intermediate     OXACILLIN >=4 RESISTANT Resistant     TETRACYCLINE <=1 SENSITIVE Sensitive     VANCOMYCIN 1 SENSITIVE Sensitive     TRIMETH/SULFA 160 RESISTANT Resistant     CLINDAMYCIN <=0.25 SENSITIVE Sensitive     RIFAMPIN <=0.5 SENSITIVE Sensitive     Inducible Clindamycin NEGATIVE Sensitive     * STAPHYLOCOCCUS SPECIES (COAGULASE NEGATIVE)  Culture, blood (routine x 2)     Status: None   Collection Time: 08/02/15  7:00 PM  Result Value Ref Range Status   Specimen Description BLOOD RIGHT ANTECUBITAL  Final   Special Requests BOTTLES DRAWN AEROBIC ONLY 10CC  Final   Culture  Setup Time   Final    GRAM POSITIVE COCCI IN CLUSTERS AEROBIC BOTTLE ONLY CRITICAL RESULT CALLED TO, READ BACK BY AND VERIFIED WITH: DR Gwenevere Abbot RN 1549 08/03/15 A BROWNING    Culture  Final    STAPHYLOCOCCUS SPECIES (COAGULASE NEGATIVE) SUSCEPTIBILITIES PERFORMED ON PREVIOUS CULTURE WITHIN THE LAST 5 DAYS.    Report Status 08/06/2015 FINAL  Final  Culture, blood (routine x 2)     Status: None   Collection Time: 08/02/15  7:05 PM  Result Value Ref Range Status   Specimen Description BLOOD RIGHT HAND  Final   Special Requests BOTTLES DRAWN AEROBIC AND ANAEROBIC 10CC  Final   Culture NO GROWTH 5 DAYS  Final   Report Status 08/07/2015 FINAL  Final  Blood Culture (routine x 2)     Status: None   Collection Time: 07/31/2015  8:51 AM  Result Value Ref Range Status   Specimen Description BLOOD UNKO  Final   Special Requests BOTTLES DRAWN AEROBIC AND ANAEROBIC  3CC  Final   Culture NO GROWTH 5 DAYS  Final   Report Status 08/17/2015 FINAL  Final  Blood Culture (routine x 2)     Status: None   Collection Time: 08/15/2015  9:20 AM  Result Value Ref Range Status    Specimen Description BLOOD LEFT ARM  Final   Special Requests BOTTLES DRAWN AEROBIC AND ANAEROBIC  1CC  Final   Culture NO GROWTH 5 DAYS  Final   Report Status 08/17/2015 FINAL  Final  Wound culture     Status: None   Collection Time: 07/25/2015  9:20 AM  Result Value Ref Range Status   Specimen Description DECUBITIS  Final   Special Requests Normal  Final   Gram Stain   Final    FEW WBC SEEN MANY GRAM NEGATIVE RODS RARE GRAM POSITIVE COCCI    Culture   Final    HEAVY GROWTH ESCHERICHIA COLI MODERATE GROWTH ENTEROBACTER AEROGENES PROTEUS MIRABILIS HEAVY GROWTH ENTEROCOCCUS SPECIES VRE HAVE INTRINSIC RESISTANCE TO MOST COMMONLY USED ANTIBIOTICS AND THE ABILITY TO ACQUIRE RESISTANCE TO MOST AVAILABLE ANTIBIOTICS.    Report Status 08/16/2015 FINAL  Final   Organism ID, Bacteria ESCHERICHIA COLI  Final   Organism ID, Bacteria ENTEROBACTER AEROGENES  Final   Organism ID, Bacteria PROTEUS MIRABILIS  Final   Organism ID, Bacteria ENTEROCOCCUS SPECIES  Final      Susceptibility   Enterobacter aerogenes - MIC*    CEFTAZIDIME <=1 SENSITIVE Sensitive     CEFAZOLIN >=64 RESISTANT Resistant     CEFTRIAXONE <=1 SENSITIVE Sensitive     CIPROFLOXACIN <=0.25 SENSITIVE Sensitive     GENTAMICIN <=1 SENSITIVE Sensitive     IMIPENEM 1 SENSITIVE Sensitive     TRIMETH/SULFA <=20 SENSITIVE Sensitive     * MODERATE GROWTH ENTEROBACTER AEROGENES   Escherichia coli - MIC*    AMPICILLIN <=2 SENSITIVE Sensitive     CEFTAZIDIME <=1 SENSITIVE Sensitive     CEFAZOLIN <=4 SENSITIVE Sensitive     CEFTRIAXONE <=1 SENSITIVE Sensitive     CIPROFLOXACIN <=0.25 SENSITIVE Sensitive     GENTAMICIN <=1 SENSITIVE Sensitive     IMIPENEM <=0.25 SENSITIVE Sensitive     TRIMETH/SULFA <=20 SENSITIVE Sensitive     * HEAVY GROWTH ESCHERICHIA COLI   Proteus mirabilis - MIC*    AMPICILLIN >=32 RESISTANT Resistant     CEFTAZIDIME <=1 SENSITIVE Sensitive     CEFAZOLIN 8 SENSITIVE Sensitive     CEFTRIAXONE <=1 SENSITIVE  Sensitive     CIPROFLOXACIN <=0.25 SENSITIVE Sensitive     GENTAMICIN <=1 SENSITIVE Sensitive     IMIPENEM 1 SENSITIVE Sensitive     TRIMETH/SULFA <=20 SENSITIVE Sensitive     * PROTEUS MIRABILIS  Enterococcus species - MIC*    AMPICILLIN >=32 RESISTANT Resistant     VANCOMYCIN >=32 RESISTANT Resistant     GENTAMICIN SYNERGY SENSITIVE Sensitive     TETRACYCLINE Value in next row Resistant      RESISTANT>=16    * HEAVY GROWTH ENTEROCOCCUS SPECIES  MRSA PCR Screening     Status: None   Collection Time: 08/21/2015  2:38 PM  Result Value Ref Range Status   MRSA by PCR NEGATIVE NEGATIVE Final    Comment:        The GeneXpert MRSA Assay (FDA approved for NASAL specimens only), is one component of a comprehensive MRSA colonization surveillance program. It is not intended to diagnose MRSA infection nor to guide or monitor treatment for MRSA infections.   Blood culture (single)     Status: None   Collection Time: 2015-08-21  3:36 PM  Result Value Ref Range Status   Specimen Description BLOOD RIGHT ASSIST CONTROL  Final   Special Requests BOTTLES DRAWN AEROBIC AND ANAEROBIC  Final   Culture NO GROWTH 5 DAYS  Final   Report Status 08/17/2015 FINAL  Final  Wound culture     Status: None   Collection Time: 08/13/15 12:37 PM  Result Value Ref Range Status   Specimen Description WOUND  Final   Special Requests Normal  Final   Gram Stain   Final    FEW WBC SEEN TOO NUMEROUS TO COUNT GRAM NEGATIVE RODS FEW GRAM POSITIVE COCCI    Culture   Final    HEAVY GROWTH ESCHERICHIA COLI MODERATE GROWTH PROTEUS MIRABILIS LIGHT GROWTH KLEBSIELLA PNEUMONIAE MODERATE GROWTH ENTEROCOCCUS GALLINARUM CRITICAL RESULT CALLED TO, READ BACK BY AND VERIFIED WITH: [facility] BORBA AT 1042 08/16/15 DV    Report Status 08/17/2015 FINAL  Final   Organism ID, Bacteria ESCHERICHIA COLI  Final   Organism ID, Bacteria PROTEUS MIRABILIS  Final   Organism ID, Bacteria KLEBSIELLA PNEUMONIAE  Final   Organism ID,  Bacteria ENTEROCOCCUS GALLINARUM  Final      Susceptibility   Escherichia coli - MIC*    AMPICILLIN >=32 RESISTANT Resistant     CEFTAZIDIME 4 RESISTANT Resistant     CEFAZOLIN >=64 RESISTANT Resistant     CEFTRIAXONE 16 RESISTANT Resistant     GENTAMICIN 2 SENSITIVE Sensitive     IMIPENEM >=16 RESISTANT Resistant     TRIMETH/SULFA <=20 SENSITIVE Sensitive     Extended ESBL POSITIVE Resistant     PIP/TAZO Value in next row Resistant      RESISTANT>=128    CIPROFLOXACIN Value in next row Sensitive      SENSITIVE<=0.25    * HEAVY GROWTH ESCHERICHIA COLI   Klebsiella pneumoniae - MIC*    AMPICILLIN Value in next row Resistant      SENSITIVE<=0.25    CEFTAZIDIME Value in next row Resistant      SENSITIVE<=0.25    CEFAZOLIN Value in next row Resistant      SENSITIVE<=0.25    CEFTRIAXONE Value in next row Resistant      SENSITIVE<=0.25    CIPROFLOXACIN Value in next row Resistant      SENSITIVE<=0.25    GENTAMICIN Value in next row Sensitive      SENSITIVE<=0.25    IMIPENEM Value in next row Resistant      SENSITIVE<=0.25    TRIMETH/SULFA Value in next row Resistant      SENSITIVE<=0.25    PIP/TAZO Value in next row Resistant      RESISTANT>=128    *  LIGHT GROWTH KLEBSIELLA PNEUMONIAE   Proteus mirabilis - MIC*    AMPICILLIN Value in next row Resistant      RESISTANT>=128    CEFTAZIDIME Value in next row Sensitive      RESISTANT>=128    CEFAZOLIN Value in next row Sensitive      RESISTANT>=128    CEFTRIAXONE Value in next row Sensitive      RESISTANT>=128    CIPROFLOXACIN Value in next row Sensitive      RESISTANT>=128    GENTAMICIN Value in next row Sensitive      RESISTANT>=128    IMIPENEM Value in next row Sensitive      RESISTANT>=128    TRIMETH/SULFA Value in next row Sensitive      RESISTANT>=128    PIP/TAZO Value in next row Sensitive      SENSITIVE<=4    * MODERATE GROWTH PROTEUS MIRABILIS   Enterococcus gallinarum - MIC*    AMPICILLIN Value in next row  Resistant      SENSITIVE<=4    GENTAMICIN SYNERGY Value in next row Sensitive      SENSITIVE<=4    CIPROFLOXACIN Value in next row Resistant      RESISTANT>=8    TETRACYCLINE Value in next row Resistant      RESISTANT>=16    * MODERATE GROWTH ENTEROCOCCUS GALLINARUM  Wound culture     Status: None   Collection Time: 08/15/15  2:53 PM  Result Value Ref Range Status   Specimen Description WOUND  Final   Special Requests NONE  Final   Gram Stain FEW WBC SEEN NO ORGANISMS SEEN   Final   Culture NO GROWTH 3 DAYS  Final   Report Status 08/18/2015 FINAL  Final  C difficile quick scan w PCR reflex     Status: None   Collection Time: 08/17/15 11:34 AM  Result Value Ref Range Status   C Diff antigen NEGATIVE NEGATIVE Final   C Diff toxin NEGATIVE NEGATIVE Final   C Diff interpretation Negative for C. difficile  Final    Assessment: 49 yo female admitted to ICU with septic shock and stage IV sacral decubitus. Pharmacy consulted for dosing and monitoring of meropenem and Daptomycin. Patient has ESRD and schuduled to receive HD today. 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 Patient remains afebrile.  Daptomycin originally started on 9/28 (day 14 of treatment) and Meropenem 9/24 (day 18 of treatment). No evidence of muscle injury at this time, CK was 15.  10/12: Daptomycin discontinued. ID physician is following along.;   Plan:  Continue Merrem 500 mg IV q8 hours.   Demetrius Charity, PharmD

## 2015-08-31 NOTE — Progress Notes (Signed)
Patients husband updated with wife's transfer to 2A and room number.

## 2015-08-31 NOTE — Progress Notes (Addendum)
Pt. Transferred from CCU to 2A, rm [id number]. Report received from CCU RN Elnita Maxwellheryl. Pt. is alert, responsive to voice, unable to verbally respond, ulcer dressings in place. Fall contract unable to be signed, hung on the wall none the less. Telemetry in place, skin assessed with Maceo ProLauren B., RN. Vitals signs stable, NG tube feeding infusing, IV abx infusing without difficulty. Per CCU RN, patients sacral wound dressing was changed today. Full assessment to Epic. Will continue to monitor.

## 2015-08-31 NOTE — Progress Notes (Signed)
Small amount of white secretions suctioned from trach. Patient tolerated procedure well

## 2015-08-31 NOTE — Progress Notes (Signed)
Subjective:  Pt resting comfortably at bedside.  Had HD yesterday.    Objective:  Vital signs in last 24 hours:  Temp:  [98.2 F (36.8 C)-99 F (37.2 C)] 98.2 F (36.8 C) (10/12 0202) Pulse Rate:  [92-108] 96 (10/12 0600) Resp:  [11-32] 24 (10/12 0600) BP: (85-165)/(33-133) 126/45 mmHg (10/12 0600) SpO2:  [99 %-100 %] 100 % (10/12 0600) FiO2 (%):  [28 %] 28 % (10/11 2023) Weight:  [84 kg (185 lb 3 oz)-87 kg (191 lb 12.8 oz)] 84 kg (185 lb 3 oz) (10/12 0558)  Weight change: 0 kg (0 lb) Filed Weights   08/30/15 0500 08/30/15 1500 08/31/15 0558  Weight: 87 kg (191 lb 12.8 oz) 87 kg (191 lb 12.8 oz) 84 kg (185 lb 3 oz)    Intake/Output: I/O last 3 completed shifts: In: 1850 [NG/GT:1390; IV Piggyback:460] Out: -461    Intake/Output this shift:     Physical Exam: General: No acute distress  Head: Normocephalic, atraumatic. +NGT   Eyes: Eyes open, tracks with eyes  Neck: Trach in place  Lungs:  Scattered rhonchi, normal effort  Heart: S1S2 no rubs  Abdomen:  Soft, nontender, BS present  Extremities:  +right upper extremity edema, trace edema of left upper extremity and dependent  Neurologic: Awake, alert, tracks with eyes  Skin: No apparent rash  Access: R IJ permcath    Basic Metabolic Panel:  Recent Labs Lab 08/27/15 0410 08/28/15 0340 08/28/15 1442 08/29/15 0511 08/30/15 0435 08/31/15 0434  NA 140 143 140 139 140 140  K 3.9 3.1* 4.1 3.7 3.3* 3.5  CL 108 117* 108 105 108 104  CO2 GLUCOSE 88 89 135* 89 120* 134*  BUN 15 21* 32* 23* 37* 29*  CREATININE 0.65 0.74 1.13* 0.81 1.09* 0.84  CALCIUM 7.8* 6.3* 7.9* 7.9* 7.5* 7.9*  MG 1.6* 1.6*  --  1.9 2.1 1.9  PHOS 2.7 2.6 3.6 2.4* 3.7 2.8    Liver Function Tests:  Recent Labs Lab 08/25/15 1608 08/25/15 2103 08/26/15 0408 08/26/15 1144 08/28/15 1442  ALBUMIN 2.1* 2.1* 2.1* 2.0* 1.9*   No results for input(s): LIPASE, AMYLASE in the last 168 hours. No results for input(s): AMMONIA  in the last 168 hours.  CBC:  Recent Labs Lab 08/24/15 1648 08/25/15 0426 08/25/15 1616 08/26/15 0408 08/27/15 0410 08/28/15 1040 08/28/15 1442  WBC 8.2 7.0  --  10.2 9.9 8.9 10.3  NEUTROABS 7.0*  --   --   --   --  7.6*  --   HGB 8.0* 6.8* 8.9* 9.2* 8.7* 9.3* 8.7*  HCT 23.7* 20.4* 26.7* 27.8* 26.5* 28.7* 26.9*  MCV 86.1 86.4  --  87.8 89.8 90.8 91.2  PLT 76* 76*  --  59* 62* 79* 82*    Cardiac Enzymes:  Recent Labs Lab 08/30/15 0435  CKTOTAL 15*    BNP: Invalid input(s): POCBNP  CBG:  Recent Labs Lab 08/30/15 1624 08/30/15 1943 08/30/15 2348 08/31/15 0315 08/31/15 0738  GLUCAP 111* 108* 104* 126* 121*    Microbiology: Results for orders placed or performed during the hospital encounter of 08/11/2015  Blood Culture (routine x 2)     Status: None   Collection Time: 08/14/2015  8:51 AM  Result Value Ref Range Status   Specimen Description BLOOD [name]  Final   Special Requests BOTTLES DRAWN AEROBIC AND ANAEROBIC  3CC  Final   Culture NO GROWTH 5 DAYS  Final   Report Status 08/17/2015  FINAL  Final  Blood Culture (routine x 2)     Status: None   Collection Time: 2015-08-29  9:20 AM  Result Value Ref Range Status   Specimen Description BLOOD LEFT ARM  Final   Special Requests BOTTLES DRAWN AEROBIC AND ANAEROBIC  1CC  Final   Culture NO GROWTH 5 DAYS  Final   Report Status 08/17/2015 FINAL  Final  Wound culture     Status: None   Collection Time: Aug 29, 2015  9:20 AM  Result Value Ref Range Status   Specimen Description DECUBITIS  Final   Special Requests Normal  Final   Gram Stain   Final    FEW WBC SEEN MANY GRAM NEGATIVE RODS RARE GRAM POSITIVE COCCI    Culture   Final    HEAVY GROWTH ESCHERICHIA COLI MODERATE GROWTH ENTEROBACTER AEROGENES PROTEUS MIRABILIS HEAVY GROWTH ENTEROCOCCUS SPECIES VRE HAVE INTRINSIC RESISTANCE TO MOST COMMONLY USED ANTIBIOTICS AND THE ABILITY TO ACQUIRE RESISTANCE TO MOST AVAILABLE ANTIBIOTICS.    Report Status 08/16/2015 FINAL   Final   Organism ID, Bacteria ESCHERICHIA COLI  Final   Organism ID, Bacteria ENTEROBACTER AEROGENES  Final   Organism ID, Bacteria PROTEUS MIRABILIS  Final   Organism ID, Bacteria ENTEROCOCCUS SPECIES  Final      Susceptibility   Enterobacter aerogenes - MIC*    CEFTAZIDIME <=1 SENSITIVE Sensitive     CEFAZOLIN >=64 RESISTANT Resistant     CEFTRIAXONE <=1 SENSITIVE Sensitive     CIPROFLOXACIN <=0.25 SENSITIVE Sensitive     GENTAMICIN <=1 SENSITIVE Sensitive     IMIPENEM 1 SENSITIVE Sensitive     TRIMETH/SULFA <=20 SENSITIVE Sensitive     * MODERATE GROWTH ENTEROBACTER AEROGENES   Escherichia coli - MIC*    AMPICILLIN <=2 SENSITIVE Sensitive     CEFTAZIDIME <=1 SENSITIVE Sensitive     CEFAZOLIN <=4 SENSITIVE Sensitive     CEFTRIAXONE <=1 SENSITIVE Sensitive     CIPROFLOXACIN <=0.25 SENSITIVE Sensitive     GENTAMICIN <=1 SENSITIVE Sensitive     IMIPENEM <=0.25 SENSITIVE Sensitive     TRIMETH/SULFA <=20 SENSITIVE Sensitive     * HEAVY GROWTH ESCHERICHIA COLI   Proteus mirabilis - MIC*    AMPICILLIN >=32 RESISTANT Resistant     CEFTAZIDIME <=1 SENSITIVE Sensitive     CEFAZOLIN 8 SENSITIVE Sensitive     CEFTRIAXONE <=1 SENSITIVE Sensitive     CIPROFLOXACIN <=0.25 SENSITIVE Sensitive     GENTAMICIN <=1 SENSITIVE Sensitive     IMIPENEM 1 SENSITIVE Sensitive     TRIMETH/SULFA <=20 SENSITIVE Sensitive     * PROTEUS MIRABILIS   Enterococcus species - MIC*    AMPICILLIN >=32 RESISTANT Resistant     VANCOMYCIN >=32 RESISTANT Resistant     GENTAMICIN SYNERGY SENSITIVE Sensitive     TETRACYCLINE Value in next row Resistant      RESISTANT>=16    * HEAVY GROWTH ENTEROCOCCUS SPECIES  MRSA PCR Screening     Status: None   Collection Time: 08/29/15  2:38 PM  Result Value Ref Range Status   MRSA by PCR NEGATIVE NEGATIVE Final    Comment:        The GeneXpert MRSA Assay (FDA approved for NASAL specimens only), is one component of a comprehensive MRSA colonization surveillance program.  It is not intended to diagnose MRSA infection nor to guide or monitor treatment for MRSA infections.   Blood culture (single)     Status: None   Collection Time: Aug 29, 2015  3:36 PM  Result  Value Ref Range Status   Specimen Description BLOOD RIGHT ASSIST CONTROL  Final   Special Requests BOTTLES DRAWN AEROBIC AND ANAEROBIC  Final   Culture NO GROWTH 5 DAYS  Final   Report Status 08/17/2015 FINAL  Final  Wound culture     Status: None   Collection Time: 08/13/15 12:37 PM  Result Value Ref Range Status   Specimen Description WOUND  Final   Special Requests Normal  Final   Gram Stain   Final    FEW WBC SEEN TOO NUMEROUS TO COUNT GRAM NEGATIVE RODS FEW GRAM POSITIVE COCCI    Culture   Final    HEAVY GROWTH ESCHERICHIA COLI MODERATE GROWTH PROTEUS MIRABILIS LIGHT GROWTH KLEBSIELLA PNEUMONIAE MODERATE GROWTH ENTEROCOCCUS GALLINARUM CRITICAL RESULT CALLED TO, READ BACK BY AND VERIFIED WITH: [facility] BORBA AT 1042 08/16/15 DV    Report Status 08/17/2015 FINAL  Final   Organism ID, Bacteria ESCHERICHIA COLI  Final   Organism ID, Bacteria PROTEUS MIRABILIS  Final   Organism ID, Bacteria KLEBSIELLA PNEUMONIAE  Final   Organism ID, Bacteria ENTEROCOCCUS GALLINARUM  Final      Susceptibility   Escherichia coli - MIC*    AMPICILLIN >=32 RESISTANT Resistant     CEFTAZIDIME 4 RESISTANT Resistant     CEFAZOLIN >=64 RESISTANT Resistant     CEFTRIAXONE 16 RESISTANT Resistant     GENTAMICIN 2 SENSITIVE Sensitive     IMIPENEM >=16 RESISTANT Resistant     TRIMETH/SULFA <=20 SENSITIVE Sensitive     Extended ESBL POSITIVE Resistant     PIP/TAZO Value in next row Resistant      RESISTANT>=128    CIPROFLOXACIN Value in next row Sensitive      SENSITIVE<=0.25    * HEAVY GROWTH ESCHERICHIA COLI   Klebsiella pneumoniae - MIC*    AMPICILLIN Value in next row Resistant      SENSITIVE<=0.25    CEFTAZIDIME Value in next row Resistant      SENSITIVE<=0.25    CEFAZOLIN Value in next row Resistant       SENSITIVE<=0.25    CEFTRIAXONE Value in next row Resistant      SENSITIVE<=0.25    CIPROFLOXACIN Value in next row Resistant      SENSITIVE<=0.25    GENTAMICIN Value in next row Sensitive      SENSITIVE<=0.25    IMIPENEM Value in next row Resistant      SENSITIVE<=0.25    TRIMETH/SULFA Value in next row Resistant      SENSITIVE<=0.25    PIP/TAZO Value in next row Resistant      RESISTANT>=128    * LIGHT GROWTH KLEBSIELLA PNEUMONIAE   Proteus mirabilis - MIC*    AMPICILLIN Value in next row Resistant      RESISTANT>=128    CEFTAZIDIME Value in next row Sensitive      RESISTANT>=128    CEFAZOLIN Value in next row Sensitive      RESISTANT>=128    CEFTRIAXONE Value in next row Sensitive      RESISTANT>=128    CIPROFLOXACIN Value in next row Sensitive      RESISTANT>=128    GENTAMICIN Value in next row Sensitive      RESISTANT>=128    IMIPENEM Value in next row Sensitive      RESISTANT>=128    TRIMETH/SULFA Value in next row Sensitive      RESISTANT>=128    PIP/TAZO Value in next row Sensitive      SENSITIVE<=4    * MODERATE  GROWTH PROTEUS MIRABILIS   Enterococcus gallinarum - MIC*    AMPICILLIN Value in next row Resistant      SENSITIVE<=4    GENTAMICIN SYNERGY Value in next row Sensitive      SENSITIVE<=4    CIPROFLOXACIN Value in next row Resistant      RESISTANT>=8    TETRACYCLINE Value in next row Resistant      RESISTANT>=16    * MODERATE GROWTH ENTEROCOCCUS GALLINARUM  Wound culture     Status: None   Collection Time: 08/15/15  2:53 PM  Result Value Ref Range Status   Specimen Description WOUND  Final   Special Requests NONE  Final   Gram Stain FEW WBC SEEN NO ORGANISMS SEEN   Final   Culture NO GROWTH 3 DAYS  Final   Report Status 08/18/2015 FINAL  Final  C difficile quick scan w PCR reflex     Status: None   Collection Time: 08/17/15 11:34 AM  Result Value Ref Range Status   C Diff antigen NEGATIVE NEGATIVE Final   C Diff toxin NEGATIVE NEGATIVE  Final   C Diff interpretation Negative for C. difficile  Final    Coagulation Studies: No results for input(s): LABPROT, INR in the last 72 hours.  Urinalysis: No results for input(s): COLORURINE, LABSPEC, PHURINE, GLUCOSEU, HGBUR, BILIRUBINUR, KETONESUR, PROTEINUR, UROBILINOGEN, NITRITE, LEUKOCYTESUR in the last 72 hours.  Invalid input(s): APPERANCEUR    Imaging: No results found.   Medications:   . feeding supplement (JEVITY 1.5 CAL/FIBER) 1,000 mL (08/30/15 1315)  . pureflow 3 each (08/25/15 0826)   . acidophilus  1 capsule Oral Daily  . amantadine  200 mg Oral Q7 days  . antiseptic oral rinse  7 mL Mouth Rinse q12n4p  . aspirin  324 mg Oral Daily  . budesonide (PULMICORT) nebulizer solution  0.5 mg Nebulization BID  . chlorhexidine  15 mL Mouth Rinse BID  . DAPTOmycin (CUBICIN)  IV  500 mg Intravenous Q48H  . epoetin (EPOGEN/PROCRIT) injection  10,000 Units Intravenous Q T,Th,Sa-HD  . feeding supplement (PRO-STAT SUGAR FREE 64)  30 mL Oral 6 times per day  . free water  100 mL Per Tube 3 times per day  . hydrocortisone  10 mg Per Tube BID  . insulin aspart  0-15 Units Subcutaneous 6 times per day  . ipratropium-albuterol  3 mL Nebulization BID  . lidocaine  1 patch Transdermal Q24H  . meropenem (MERREM) IV  500 mg Intravenous q1800  . midodrine  10 mg Oral TID WC  . multivitamin  5 mL Per Tube Daily  . nystatin   Topical TID  . pantoprazole sodium  40 mg Per Tube Q1200  . sodium hypochlorite   Irrigation BID   sodium chloride, sodium chloride, acetaminophen (TYLENOL) oral liquid 160 mg/5 mL, alteplase, anticoagulant sodium citrate, lidocaine (PF), lidocaine-prilocaine, loperamide, morphine injection, pentafluoroprop-tetrafluoroeth  Assessment/ Plan:  49 y.o. 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  1. End-stage renal disease on hemodialysis on HD TTHS. The patient has been on dialysis since October of 2014. R IJ permcath.  -  Pt had HD yesterday, no acute indication for HD today, will plan for HD again tomorrow.  2. Anemia of CKD: status post PRBC transfusions. - Hgb 8.7 at last check, will continue epogen 10000 units IV with HD.   3. Secondary hyperparathyroidism: PTH low at 55. - phos down to 2.8 yesterday, no indication for binders, will follow phos.  4. Hypotension: off vassopressors.  - BP 126/45, continue midodrine.   LOS: 19 Zsazsa Bahena 10/12/20168:10 AM

## 2015-09-01 DIAGNOSIS — R5381 Other malaise: Secondary | ICD-10-CM

## 2015-09-01 LAB — GLUCOSE, CAPILLARY
GLUCOSE-CAPILLARY: 116 mg/dL — AB (ref 65–99)
GLUCOSE-CAPILLARY: 141 mg/dL — AB (ref 65–99)
Glucose-Capillary: 143 mg/dL — ABNORMAL HIGH (ref 65–99)

## 2015-09-01 MED ORDER — SODIUM CHLORIDE 0.9 % IJ SOLN
10.0000 mL | Freq: Two times a day (BID) | INTRAMUSCULAR | Status: DC
  Administered 2015-09-01: 10 mL
  Administered 2015-09-01: 20 mL
  Administered 2015-09-02 – 2015-09-13 (×22): 10 mL
  Administered 2015-09-13 – 2015-09-14 (×2): 30 mL
  Administered 2015-09-14 – 2015-09-18 (×9): 10 mL
  Administered 2015-09-19: 20 mL
  Administered 2015-09-19: 10 mL
  Administered 2015-09-20 – 2015-09-21 (×3): 20 mL
  Administered 2015-09-21: 10 mL
  Administered 2015-09-22: 20 mL
  Administered 2015-09-22 – 2015-09-23 (×2): 10 mL
  Administered 2015-09-23: 20 mL
  Administered 2015-09-24 (×2): 10 mL
  Administered 2015-09-25: 20 mL
  Administered 2015-09-25: 10 mL
  Administered 2015-09-26: 20 mL
  Administered 2015-09-26 – 2015-09-27 (×2): 10 mL
  Administered 2015-09-27: 20 mL
  Administered 2015-09-28 – 2015-09-29 (×4): 10 mL
  Administered 2015-09-30: 20 mL
  Administered 2015-10-01 – 2015-10-04 (×9): 10 mL
  Administered 2015-10-05: 20 mL
  Administered 2015-10-05 – 2015-10-09 (×9): 10 mL
  Administered 2015-10-10: 20 mL
  Administered 2015-10-10: 30 mL
  Administered 2015-10-11: 10 mL
  Administered 2015-10-11: 20 mL
  Administered 2015-10-12: 10 mL
  Administered 2015-10-12: 20 mL
  Administered 2015-10-13 – 2015-10-14 (×3): 10 mL
  Administered 2015-10-14: 40 mL
  Administered 2015-10-15: 20 mL
  Administered 2015-10-15 – 2015-10-19 (×9): 10 mL
  Administered 2015-10-20 (×2): 20 mL
  Administered 2015-10-21 – 2015-10-23 (×5): 10 mL
  Administered 2015-10-23: 20 mL
  Administered 2015-10-24: 10 mL
  Administered 2015-10-24 – 2015-10-26 (×4): 20 mL
  Administered 2015-10-26 – 2015-10-28 (×5): 10 mL
  Administered 2015-10-29: 20 mL
  Administered 2015-10-29: 10 mL
  Administered 2015-10-30: 20 mL
  Administered 2015-10-31 (×2): 10 mL
  Administered 2015-10-31: 20 mL
  Administered 2015-11-01: 10 mL
  Administered 2015-11-01: 20 mL
  Administered 2015-11-02 (×2): 10 mL
  Administered 2015-11-03: 20 mL
  Administered 2015-11-03: 10 mL
  Administered 2015-11-04: 20 mL
  Administered 2015-11-04: 10 mL
  Administered 2015-11-05: 30 mL
  Administered 2015-11-05: 20 mL
  Administered 2015-11-06: 30 mL
  Administered 2015-11-06: 10 mL
  Administered 2015-11-07: 30 mL
  Administered 2015-11-07: 10 mL
  Administered 2015-11-08: 30 mL
  Administered 2015-11-08: 20 mL
  Administered 2015-11-09: 10 mL
  Administered 2015-11-09: 20 mL
  Administered 2015-11-10: 10 mL
  Administered 2015-11-10: 20 mL
  Administered 2015-11-11: 10 mL
  Administered 2015-11-11: 20 mL
  Administered 2015-11-12: 10 mL
  Administered 2015-11-12: 20 mL
  Administered 2015-11-13 – 2015-11-14 (×4): 10 mL
  Administered 2015-11-15: 30 mL
  Administered 2015-11-15 – 2015-11-16 (×2): 10 mL
  Administered 2015-11-17: 20 mL
  Administered 2015-11-17: 10 mL
  Administered 2015-11-18: 20 mL
  Administered 2015-11-19: 10 mL
  Administered 2015-11-19: 20 mL
  Administered 2015-11-20: 10 mL
  Administered 2015-11-20: 20 mL
  Administered 2015-11-21 – 2015-11-24 (×7): 10 mL
  Administered 2015-11-25: 40 mL
  Administered 2015-11-25: 10 mL
  Administered 2015-11-26: 40 mL
  Administered 2015-11-26 – 2015-11-27 (×2): 10 mL
  Administered 2015-11-27: 40 mL
  Administered 2015-11-28 – 2015-11-30 (×5): 10 mL
  Administered 2015-11-30 – 2015-12-01 (×2): 20 mL
  Administered 2015-12-01: 10 mL
  Administered 2015-12-02: 40 mL
  Administered 2015-12-02 – 2015-12-03 (×2): 10 mL
  Administered 2015-12-03: 40 mL
  Administered 2015-12-04 – 2015-12-06 (×4): 10 mL

## 2015-09-01 MED ORDER — OXYCODONE-ACETAMINOPHEN 5-325 MG PO TABS
1.0000 | ORAL_TABLET | ORAL | Status: DC | PRN
  Administered 2015-09-03 – 2015-09-11 (×12): 1
  Filled 2015-09-01 (×12): qty 1

## 2015-09-01 MED ORDER — ALBUMIN HUMAN 25 % IV SOLN
12.5000 g | Freq: Once | INTRAVENOUS | Status: AC
Start: 2015-09-01 — End: 2015-09-01
  Administered 2015-09-01: 12.5 g via INTRAVENOUS
  Filled 2015-09-01: qty 50

## 2015-09-01 MED ORDER — SODIUM CHLORIDE 0.9 % IJ SOLN
10.0000 mL | INTRAMUSCULAR | Status: DC | PRN
  Administered 2015-09-04 – 2015-11-01 (×5): 10 mL
  Filled 2015-09-01 (×5): qty 40

## 2015-09-01 NOTE — Progress Notes (Signed)
HD tx start 

## 2015-09-01 NOTE — Progress Notes (Signed)
HD tx completed.

## 2015-09-01 NOTE — Care Management (Signed)
Spoke with Dr Sung AmabileSimonds.  He will order speech therapy to perform swallowing evaluation- which he adds patient will probably fail.  GI/surgeons not willing to place a Gtube/Peg due to previous gastric bypass surgery.  If fails swallowing study, would have to discuss peg/gtube with interventional radiology.  If it is felt [facility] will have to transfer to a tertiary facility for the procedure.  It is discussed that the patient's husband is extremely hopeful that patient will not require feedings.  Dr Sung AmabileSimonds states that there is no indication for palliative care intervention.  There is a care management consult placed 10/11 to discuss discharge options with patient's husband.  Have been [facility]  Discussed that patient level of care may meet LTAC but that it has been documented that patient not LTAC appropriate. It is verbally reported that one reason ltac not approriate as there is question about obtainable goals.   Patient is having diarrhea stools and had rectal tube  To keep stool out of wound but developed a rectal ulcer so tube was discontinued.  Has complex wound on back and there has been discussion of wound vac and or surgical intervention but patient is not able to stay off the wound.

## 2015-09-01 NOTE — Progress Notes (Signed)
Post hd tx 

## 2015-09-01 NOTE — Progress Notes (Signed)
Pre-hd tx 

## 2015-09-01 NOTE — Progress Notes (Signed)
Physical Therapy Treatment Patient Details Name: Courtney [name] MRN: [id number] DOB: Dec 12, 1965 Today's Date: 09/01/2015    History of Present Illness Patient is status post Gastric bypass with sleeve gastrectomy March 2014 at [facility] complicated by SBO requiring lysis of adhesions with subsequent small bowel enterotomy resulting in shock and multiple cardiac arrests, chronic respiratory failure s/p tracheostomy, DVT/PE on coumadin, HTN, DM2, OSA, COPD, ESRD. Intra-abdominal abscesses s/p lap LOA and drainage 03/2013. Subsequently has experienced numerous infections and hospitalizations since that time. Current hospitalization due to unresponsiveness, hypotension; admitted for septic shock likely due to infected sacral ulcer. 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 Later found to have GI bleed and rectal ulcer.      PT Comments    Patient now transitioned to telemetry unit; progressed from CCU. Currently with very limited active effort, participation with therapist this date.  Maintains eyes closed majority of session; at times, attempts to pull L UE away from therapist during therex.  Very minimal attempts at interaction with therapist this date.  Continues with largely passive efforts from therapist due to limited participation by patient.   Follow Up Recommendations  LTACH;SNF     Equipment Recommendations       Recommendations for Other Services       Precautions / Restrictions Precautions Precautions: Fall Precaution Comments: stage 4 sacral ulcer, NG tube/NPO, trach, contact isolation Restrictions Weight Bearing Restrictions: No    Mobility  Bed Mobility                  Transfers                    Ambulation/Gait                 Stairs            Wheelchair Mobility    Modified Rankin (Stroke Patients Only)       Balance                                    Cognition  Arousal/Alertness: Lethargic Behavior During Therapy: Flat affect (generally disinterested in participation with therapy) Overall Cognitive Status: Difficult to assess (limited responsiveness, limited participation with attempts to assess.  No attempts at spontaneous communication; often closing eyes, refusing to follow commands this AM)                      Exercises Other Exercises Other Exercises: Supine UE therex, 1x10, passive ROM: gross grasp/release, wrist flex/ext, forearm pronation/supination, elbow flex/ext, shoulder flex/ext (to shoulder height) and abduct/adduct. Other Exercises: Supine LE therex, 1x10, passive ROM: ankle PF/DF with end range DF stretching, hip abduct/adduct and hip flex/ext (facial grimacing noted with hip flexion, tolerating only to approx 40-45 degrees bilat).    General Comments        Pertinent Vitals/Pain Pain Assessment: Faces Faces Pain Scale: Hurts little more Pain Location: bilat hips with passive hip flexion Pain Descriptors / Indicators:  (unable to describe) Pain Intervention(s): Limited activity within patient's tolerance;Monitored during session;Repositioned    Home Living                      Prior Function            PT Goals (current goals can now be found in the care plan section) Acute Rehab PT  Goals Patient Stated Goal: patient unable to verbalize PT Goal Formulation: Patient unable to participate in goal setting Time For Goal Achievement: 09/15/15 Potential to Achieve Goals: Fair    Frequency  Min 2X/week    PT Plan      Co-evaluation             End of Session   Activity Tolerance:  (limited by overall active effort/participation with session) Patient left: in bed;with call bell/phone within reach;with bed alarm set     Time: [id number] PT Time Calculation (min) (ACUTE ONLY): 13 min  Charges:  $Therapeutic Exercise: 8-22 mins                    G Codes:      Rossana Molchan H. Manson PasseyBrown, PT, DPT,  NCS 09/01/2015, 10:20 AM [phone]

## 2015-09-01 NOTE — Progress Notes (Signed)
Patient transferred 2-A.  Clinical Social Worker will continue to follow patient and work with treatment team for ongoing needs and disposition planning.     Courtney Hineseborah Moore. Theresia MajorsLCSWA, MSW Clinical Social Work Department [phone] 3:06 PM

## 2015-09-01 NOTE — Progress Notes (Signed)
Subjective:  Pt due for HD today. To be performed at bedside.  Overall no significant change in condition. Appears quite depressed.    Objective:  Vital signs in last 24 hours:  Temp:  [97.7 F (36.5 C)-98.3 F (36.8 C)] 98.1 F (36.7 C) (10/13 1151) Pulse Rate:  [89-102] 91 (10/13 1600) Resp:  [16-20] 16 (10/13 1600) BP: (103-146)/(42-85) 110/52 mmHg (10/13 1600) SpO2:  [99 %-100 %] 100 % (10/13 1457) FiO2 (%):  [28 %] 28 % (10/13 0738)  Weight change:  Filed Weights   08/30/15 0500 08/30/15 1500 08/31/15 0558  Weight: 87 kg (191 lb 12.8 oz) 87 kg (191 lb 12.8 oz) 84 kg (185 lb 3 oz)    Intake/Output: I/O last 3 completed shifts: In: 720 [NG/GT:720] Out: -    Intake/Output this shift:  Total I/O In: 240 [NG/GT:240] Out: 0   Physical Exam: General: No acute distress  Head: Normocephalic, atraumatic. +NGT   Eyes: Eyes open, tracks with eyes  Neck: Trach in place  Lungs:  Scattered rhonchi, normal effort  Heart: S1S2 no rubs  Abdomen:  Soft, nontender, BS present  Extremities:  +right upper extremity edema, trace edema of left upper extremity and dependent  Neurologic: Awake, alert, tracks with eyes, not following commands however, depressed affect  Skin: No apparent rash  Access: R IJ permcath    Basic Metabolic Panel:  Recent Labs Lab 08/27/15 0410 08/28/15 0340 08/28/15 1442 08/29/15 0511 08/30/15 0435 08/31/15 0434  NA 140 143 140 139 140 140  K 3.9 3.1* 4.1 3.7 3.3* 3.5  CL 108 117* 108 105 108 104  CO2 GLUCOSE 88 89 135* 89 120* 134*  BUN 15 21* 32* 23* 37* 29*  CREATININE 0.65 0.74 1.13* 0.81 1.09* 0.84  CALCIUM 7.8* 6.3* 7.9* 7.9* 7.5* 7.9*  MG 1.6* 1.6*  --  1.9 2.1 1.9  PHOS 2.7 2.6 3.6 2.4* 3.7 2.8    Liver Function Tests:  Recent Labs Lab 08/25/15 2103 08/26/15 0408 08/26/15 1144 08/28/15 1442  ALBUMIN 2.1* 2.1* 2.0* 1.9*   No results for input(s): LIPASE, AMYLASE in the last 168 hours. No results for  input(s): AMMONIA in the last 168 hours.  CBC:  Recent Labs Lab 08/26/15 0408 08/27/15 0410 08/28/15 1040 08/28/15 1442  WBC 10.2 9.9 8.9 10.3  NEUTROABS  --   --  7.6*  --   HGB 9.2* 8.7* 9.3* 8.7*  HCT 27.8* 26.5* 28.7* 26.9*  MCV 87.8 89.8 90.8 91.2  PLT 59* 62* 79* 82*    Cardiac Enzymes:  Recent Labs Lab 08/30/15 0435  CKTOTAL 15*    BNP: Invalid input(s): POCBNP  CBG:  Recent Labs Lab 08/31/15 1553 08/31/15 2011 09/01/15 0146 09/01/15 0806 09/01/15 1059  GLUCAP 123* 97 116* 143* 141*    Microbiology: Results for orders placed or performed during the hospital encounter of 08/10/2015  Blood Culture (routine x 2)     Status: None   Collection Time: 08/17/2015  8:51 AM  Result Value Ref Range Status   Specimen Description BLOOD [name]  Final   Special Requests BOTTLES DRAWN AEROBIC AND ANAEROBIC  3CC  Final   Culture NO GROWTH 5 DAYS  Final   Report Status 08/17/2015 FINAL  Final  Blood Culture (routine x 2)     Status: None   Collection Time: 07/31/2015  9:20 AM  Result Value Ref Range Status   Specimen Description BLOOD LEFT ARM  Final  Special Requests BOTTLES DRAWN AEROBIC AND ANAEROBIC  1CC  Final   Culture NO GROWTH 5 DAYS  Final   Report Status 08/17/2015 FINAL  Final  Wound culture     Status: None   Collection Time: 08/16/2015  9:20 AM  Result Value Ref Range Status   Specimen Description DECUBITIS  Final   Special Requests Normal  Final   Gram Stain   Final    FEW WBC SEEN MANY GRAM NEGATIVE RODS RARE GRAM POSITIVE COCCI    Culture   Final    HEAVY GROWTH ESCHERICHIA COLI MODERATE GROWTH ENTEROBACTER AEROGENES PROTEUS MIRABILIS HEAVY GROWTH ENTEROCOCCUS SPECIES VRE HAVE INTRINSIC RESISTANCE TO MOST COMMONLY USED ANTIBIOTICS AND THE ABILITY TO ACQUIRE RESISTANCE TO MOST AVAILABLE ANTIBIOTICS.    Report Status 08/16/2015 FINAL  Final   Organism ID, Bacteria ESCHERICHIA COLI  Final   Organism ID, Bacteria ENTEROBACTER AEROGENES  Final    Organism ID, Bacteria PROTEUS MIRABILIS  Final   Organism ID, Bacteria ENTEROCOCCUS SPECIES  Final      Susceptibility   Enterobacter aerogenes - MIC*    CEFTAZIDIME <=1 SENSITIVE Sensitive     CEFAZOLIN >=64 RESISTANT Resistant     CEFTRIAXONE <=1 SENSITIVE Sensitive     CIPROFLOXACIN <=0.25 SENSITIVE Sensitive     GENTAMICIN <=1 SENSITIVE Sensitive     IMIPENEM 1 SENSITIVE Sensitive     TRIMETH/SULFA <=20 SENSITIVE Sensitive     * MODERATE GROWTH ENTEROBACTER AEROGENES   Escherichia coli - MIC*    AMPICILLIN <=2 SENSITIVE Sensitive     CEFTAZIDIME <=1 SENSITIVE Sensitive     CEFAZOLIN <=4 SENSITIVE Sensitive     CEFTRIAXONE <=1 SENSITIVE Sensitive     CIPROFLOXACIN <=0.25 SENSITIVE Sensitive     GENTAMICIN <=1 SENSITIVE Sensitive     IMIPENEM <=0.25 SENSITIVE Sensitive     TRIMETH/SULFA <=20 SENSITIVE Sensitive     * HEAVY GROWTH ESCHERICHIA COLI   Proteus mirabilis - MIC*    AMPICILLIN >=32 RESISTANT Resistant     CEFTAZIDIME <=1 SENSITIVE Sensitive     CEFAZOLIN 8 SENSITIVE Sensitive     CEFTRIAXONE <=1 SENSITIVE Sensitive     CIPROFLOXACIN <=0.25 SENSITIVE Sensitive     GENTAMICIN <=1 SENSITIVE Sensitive     IMIPENEM 1 SENSITIVE Sensitive     TRIMETH/SULFA <=20 SENSITIVE Sensitive     * PROTEUS MIRABILIS   Enterococcus species - MIC*    AMPICILLIN >=32 RESISTANT Resistant     VANCOMYCIN >=32 RESISTANT Resistant     GENTAMICIN SYNERGY SENSITIVE Sensitive     TETRACYCLINE Value in next row Resistant      RESISTANT>=16    * HEAVY GROWTH ENTEROCOCCUS SPECIES  MRSA PCR Screening     Status: None   Collection Time: 08/09/2015  2:38 PM  Result Value Ref Range Status   MRSA by PCR NEGATIVE NEGATIVE Final    Comment:        The GeneXpert MRSA Assay (FDA approved for NASAL specimens only), is one component of a comprehensive MRSA colonization surveillance program. It is not intended to diagnose MRSA infection nor to guide or monitor treatment for MRSA infections.    Blood culture (single)     Status: None   Collection Time: 07/27/2015  3:36 PM  Result Value Ref Range Status   Specimen Description BLOOD RIGHT ASSIST CONTROL  Final   Special Requests BOTTLES DRAWN AEROBIC AND ANAEROBIC  Final   Culture NO GROWTH 5 DAYS  Final   Report Status 08/17/2015  FINAL  Final  Wound culture     Status: None   Collection Time: 08/13/15 12:37 PM  Result Value Ref Range Status   Specimen Description WOUND  Final   Special Requests Normal  Final   Gram Stain   Final    FEW WBC SEEN TOO NUMEROUS TO COUNT GRAM NEGATIVE RODS FEW GRAM POSITIVE COCCI    Culture   Final    HEAVY GROWTH ESCHERICHIA COLI MODERATE GROWTH PROTEUS MIRABILIS LIGHT GROWTH KLEBSIELLA PNEUMONIAE MODERATE GROWTH ENTEROCOCCUS GALLINARUM CRITICAL RESULT CALLED TO, READ BACK BY AND VERIFIED WITH: [facility] BORBA AT 1042 08/16/15 DV    Report Status 08/17/2015 FINAL  Final   Organism ID, Bacteria ESCHERICHIA COLI  Final   Organism ID, Bacteria PROTEUS MIRABILIS  Final   Organism ID, Bacteria KLEBSIELLA PNEUMONIAE  Final   Organism ID, Bacteria ENTEROCOCCUS GALLINARUM  Final      Susceptibility   Escherichia coli - MIC*    AMPICILLIN >=32 RESISTANT Resistant     CEFTAZIDIME 4 RESISTANT Resistant     CEFAZOLIN >=64 RESISTANT Resistant     CEFTRIAXONE 16 RESISTANT Resistant     GENTAMICIN 2 SENSITIVE Sensitive     IMIPENEM >=16 RESISTANT Resistant     TRIMETH/SULFA <=20 SENSITIVE Sensitive     Extended ESBL POSITIVE Resistant     PIP/TAZO Value in next row Resistant      RESISTANT>=128    CIPROFLOXACIN Value in next row Sensitive      SENSITIVE<=0.25    * HEAVY GROWTH ESCHERICHIA COLI   Klebsiella pneumoniae - MIC*    AMPICILLIN Value in next row Resistant      SENSITIVE<=0.25    CEFTAZIDIME Value in next row Resistant      SENSITIVE<=0.25    CEFAZOLIN Value in next row Resistant      SENSITIVE<=0.25    CEFTRIAXONE Value in next row Resistant      SENSITIVE<=0.25    CIPROFLOXACIN  Value in next row Resistant      SENSITIVE<=0.25    GENTAMICIN Value in next row Sensitive      SENSITIVE<=0.25    IMIPENEM Value in next row Resistant      SENSITIVE<=0.25    TRIMETH/SULFA Value in next row Resistant      SENSITIVE<=0.25    PIP/TAZO Value in next row Resistant      RESISTANT>=128    * LIGHT GROWTH KLEBSIELLA PNEUMONIAE   Proteus mirabilis - MIC*    AMPICILLIN Value in next row Resistant      RESISTANT>=128    CEFTAZIDIME Value in next row Sensitive      RESISTANT>=128    CEFAZOLIN Value in next row Sensitive      RESISTANT>=128    CEFTRIAXONE Value in next row Sensitive      RESISTANT>=128    CIPROFLOXACIN Value in next row Sensitive      RESISTANT>=128    GENTAMICIN Value in next row Sensitive      RESISTANT>=128    IMIPENEM Value in next row Sensitive      RESISTANT>=128    TRIMETH/SULFA Value in next row Sensitive      RESISTANT>=128    PIP/TAZO Value in next row Sensitive      SENSITIVE<=4    * MODERATE GROWTH PROTEUS MIRABILIS   Enterococcus gallinarum - MIC*    AMPICILLIN Value in next row Resistant      SENSITIVE<=4    GENTAMICIN SYNERGY Value in next row Sensitive      SENSITIVE<=4  CIPROFLOXACIN Value in next row Resistant      RESISTANT>=8    TETRACYCLINE Value in next row Resistant      RESISTANT>=16    * MODERATE GROWTH ENTEROCOCCUS GALLINARUM  Wound culture     Status: None   Collection Time: 08/15/15  2:53 PM  Result Value Ref Range Status   Specimen Description WOUND  Final   Special Requests NONE  Final   Gram Stain FEW WBC SEEN NO ORGANISMS SEEN   Final   Culture NO GROWTH 3 DAYS  Final   Report Status 08/18/2015 FINAL  Final  C difficile quick scan w PCR reflex     Status: None   Collection Time: 08/17/15 11:34 AM  Result Value Ref Range Status   C Diff antigen NEGATIVE NEGATIVE Final   C Diff toxin NEGATIVE NEGATIVE Final   C Diff interpretation Negative for C. difficile  Final    Coagulation Studies: No results for  input(s): LABPROT, INR in the last 72 hours.  Urinalysis: No results for input(s): COLORURINE, LABSPEC, PHURINE, GLUCOSEU, HGBUR, BILIRUBINUR, KETONESUR, PROTEINUR, UROBILINOGEN, NITRITE, LEUKOCYTESUR in the last 72 hours.  Invalid input(s): APPERANCEUR    Imaging: No results found.   Medications:   . feeding supplement (JEVITY 1.5 CAL/FIBER) 1,000 mL (08/31/15 1558)   . acidophilus  1 capsule Oral Daily  . amantadine  200 mg Oral Q7 days  . antiseptic oral rinse  7 mL Mouth Rinse q12n4p  . chlorhexidine  15 mL Mouth Rinse BID  . epoetin (EPOGEN/PROCRIT) injection  10,000 Units Intravenous Q T,Th,Sa-HD  . feeding supplement (PRO-STAT SUGAR FREE 64)  30 mL Oral 6 times per day  . free water  100 mL Per Tube 3 times per day  . hydrocortisone  10 mg Per Tube BID  . lidocaine  1 patch Transdermal Q24H  . meropenem (MERREM) IV  500 mg Intravenous q1800  . midodrine  10 mg Oral TID WC  . multivitamin  5 mL Per Tube Daily  . nystatin   Topical TID  . pantoprazole sodium  40 mg Per Tube Q1200  . sodium chloride  10-40 mL Intracatheter Q12H  . sodium hypochlorite   Irrigation BID   sodium chloride, sodium chloride, acetaminophen (TYLENOL) oral liquid 160 mg/5 mL, alteplase, anticoagulant sodium citrate, ipratropium-albuterol, lidocaine (PF), lidocaine-prilocaine, loperamide, oxyCODONE-acetaminophen, pentafluoroprop-tetrafluoroeth, sodium chloride  Assessment/ Plan:  49 y.o. 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  1. End-stage renal disease on hemodialysis on HD TTHS. The patient has been on dialysis since October of 2014. R IJ permcath.  - Pt due for HD today, orders prepared, being dialyzed at bedside.   2. Anemia of CKD: status post PRBC transfusions. - continue epogen 10000 units IV with HD.   3. Secondary hyperparathyroidism: PTH low at 55. - phos has been low, no need for binders.  4. Hypotension: off vasopressors.  - on midodrine to help maintain pressures esspecially during dialysis.    LOS: 20 Cantrell Martus 10/13/20164:23 PM

## 2015-09-01 NOTE — Progress Notes (Signed)
[facility] [place] Critical Care Medicine Progess Note  Name: Courtney [name] MRN: [id number] DOB: Jan 06, 1966    ADMISSION DATE:  08/06/2015 ADMISSION DATE: 07/22/2015  INITIAL PRESENTATION:  87 F who has been in medical facilities (hosp, LTAC, rehab) for 2 yrs following gastric bypass surgery with multiple complications. Now with chronic trach, ESRD, profound debilitation, severe sacral pressure ulcer. Was seemingly making progress and transferred to rehab facility approx one week prior to this admission. She was sent to [facility] ED with AMS and hypotension. Working dx of severe sepsis/septic shock due to infected sacral pressure ulcer. Also has R side externalized HD cath and tunneled L IJ CVL. She has history of resistant bacterial infections. She is on chronic steroids. Her husband indicates that she has been diagnosed with adrenal insuff at some point during the past 2 yrs  MAJOR EVENTS/TEST RESULTS:  Admission 02/07/14-05/07/14 Admission 07/21/14-09/06/14 Discharged to [facility]. Pt had palliative consult at that time, were asked to sign off by husband.  09/23 CT head: NAD 09/23 EEG: no epileptiform activity 09/23 PRBCs for Hgb 6.4 09/24 bedside debridement of sacral wound. Abscess drained 09/25 Off vasopressors. More alert. No distress. Worsening thrombocytopenia. Vanc DC'd 09/29  Dr. Sampson Goon (I.D) excused from the case by patient's husband. 10/02 MRI -multiple infarcts 10/03 tracheal bleeding- transfused platelets 10/03 hospitalist service excused from the case by patient's husband 10/03 Echocardiogram ejection fraction was 55-60%, pulmonary systolic pressure was 39 mmHg 63/87 restart TF's at lower rate, attempt reg HD  10/12 Transferred to med-surg floor. 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 History of sepsis from C. glabrata MRSA PCR 9/23 >> NEG Urine 9/23 >>  Wound (swab) 9/23 >> multiple organisms Wound (debridement) 9/24 >> Enterococcus, K. Pneumoniae, P. Mirabilis, VRE Wound 9/26 >> No growth Blood 9/23 >> neg CDiff 9/27>>neg  ANTIMICROBIALS:  Aztreonam 9/23 >> 9/24 Vanc 9/23 >> 9/25 Vanc 9/26>>9/27 Daptomycin 9/27>> 10/12 Meropenem 9/23 >>    SUBJECTIVE:  RASS -2. Received 2 doses of morphine overnight.  VITAL SIGNS: Temp:  [97.7 F (36.5 C)-98.3 F (36.8 C)] 98.1 F (36.7 C) (10/13 1151) Pulse Rate:  [89-102] 102 (10/13 1151) Resp:  [4-27] 16 (10/13 1151) BP: (103-146)/(34-85) 137/83 mmHg (10/13 1151) SpO2:  [99 %-100 %] 100 % (10/13 1151) FiO2 (%):  [28 %] 28 % (10/13 0738) HEMODYNAMICS:   VENTILATOR SETTINGS: Vent Mode:  [-]  FiO2 (%):  [28 %] 28 % INTAKE / OUTPUT:  Intake/Output Summary (Last 24 hours) at 09/01/15 1158 Last data filed at 09/01/15 1133  Gross per 24 hour  Intake    260 ml  Output      0 ml  Net    260 ml    PHYSICAL EXAMINATION: Physical Examination:   VS: BP 137/83 mmHg  Pulse 102  Temp(Src) 98.1 F (36.7 C) (Oral)  Resp 16  Ht  (1.753 m)  Wt 84 kg (185 lb 3 oz)  BMI 27.33 kg/m2  SpO2 100%  General Appearance: No resp distress  Neuro: profoundly diffusely weak HEENT: cushingoid facies, PERRLA, EOM intact Neck: trach site clean Pulmonary: clear anteriorly Cardiovascular: reg, no M  Abdomen: soft, NT, +BS Extremities: warm, no edema   LABS:   LABORATORY PANEL:   CBC  Recent Labs Lab 08/28/15  1442  WBC 10.3  HGB 8.7*  HCT 26.9*  PLT 82*    Chemistries   Recent Labs Lab 08/31/15 0434  NA 140  K 3.5  CL 104  CO2 31  GLUCOSE 134*  BUN 29*  CREATININE 0.84  CALCIUM 7.9*  MG 1.9  PHOS 2.8     Recent Labs Lab 08/31/15 1224 08/31/15 1553 08/31/15 2011 09/01/15 0146 09/01/15 0806 09/01/15 1059    GLUCAP 105* 123* 97 116* 143* 141*   No results for input(s): PHART, PCO2ART, PO2ART in the last 168 hours.  Recent Labs Lab 08/26/15 0408 08/26/15 1144 08/28/15 1442  ALBUMIN 2.1* 2.0* 1.9*    Cardiac Enzymes No results for input(s): TROPONINI in the last 168 hours.  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 Diff Stool incontinence Dysphagia P:  Cont PPI Avoid anticoagulants Cont Flexiseal SLP eval 10/13  HEMATOLOGIC A:  Acute on chronic anemia Thrombocytopenia - HIT panel negative 9/25 P:  DVT px: SCDs Monitor CBC intermittently Transfuse per usual guidelines  INFECTIOUS A:  Severe sepsis, resolved Sacral decubitus ulcer with abscess P:  Monitor temp, WBC count Micro and abx as above  ENDOCRINE A:  DM 2, controlled. 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 Profound deconditioning Chronic pain P: RASS goal: 0 Minimize sedating meds Changed analgesics to PRN oxycodone 10/13 Cont PT  Per Neurology, no indication for TEE presently as she would not be a candidate for anticoagulation  SOCIAL/DISPO: [name] has been somewhat difficult and has fired ID and Hospitalist services [facility] but she cannot go there with NGT and husband has been reluctant to consent to G-tube placement Care management very involved   Billy Fischeravid Simonds, MD PCCM service Mobile [phone] Pager [phone]

## 2015-09-01 NOTE — Care Management (Addendum)
Received patient from icu.  It is anticipated that patient will discharge to a skilled nursing facility.  It is documented in some areas that patient will transition to a long term acute care facility but csw reports that patient will discharge to [facility].  Spoke with attending that provided care on 10/12.  ICU attendings will follow patient on 2A due to multiple conflicts between family and hospitalists/consultatns.  Patient has a dobb hoff feeding tube and she would not be able to transfer to a skilled facility with a dobb hoff. Spoke with dialysis coordinator and this new hd referral is complete. CM will investigate plan for Peg/gtube placement

## 2015-09-02 ENCOUNTER — Inpatient Hospital Stay: Payer: 59

## 2015-09-02 DIAGNOSIS — A155 Tuberculosis of larynx, trachea and bronchus: Secondary | ICD-10-CM

## 2015-09-02 LAB — CBC
HEMATOCRIT: 27.5 % — AB (ref 35.0–47.0)
HEMOGLOBIN: 8.5 g/dL — AB (ref 12.0–16.0)
MCH: 29.7 pg (ref 26.0–34.0)
MCHC: 30.9 g/dL — AB (ref 32.0–36.0)
MCV: 96 fL (ref 80.0–100.0)
Platelets: 162 10*3/uL (ref 150–440)
RBC: 2.86 MIL/uL — ABNORMAL LOW (ref 3.80–5.20)
RDW: 23.3 % — AB (ref 11.5–14.5)
WBC: 12.9 10*3/uL — ABNORMAL HIGH (ref 3.6–11.0)

## 2015-09-02 LAB — BASIC METABOLIC PANEL
ANION GAP: 3 — AB (ref 5–15)
BUN: 38 mg/dL — AB (ref 6–20)
CO2: 34 mmol/L — AB (ref 22–32)
Calcium: 7.8 mg/dL — ABNORMAL LOW (ref 8.9–10.3)
Chloride: 106 mmol/L (ref 101–111)
Creatinine, Ser: 0.82 mg/dL (ref 0.44–1.00)
GFR calc Af Amer: >60 mL/min (ref >60)
GLUCOSE: 139 mg/dL — AB (ref 65–99)
POTASSIUM: 3.4 mmol/L — AB (ref 3.5–5.1)
Sodium: 143 mmol/L (ref 135–145)

## 2015-09-02 LAB — GLUCOSE, CAPILLARY
GLUCOSE-CAPILLARY: 127 mg/dL — AB (ref 65–99)
GLUCOSE-CAPILLARY: 132 mg/dL — AB (ref 65–99)
Glucose-Capillary: 125 mg/dL — ABNORMAL HIGH (ref 65–99)
Glucose-Capillary: 94 mg/dL (ref 65–99)

## 2015-09-02 MED ORDER — HEPARIN SODIUM (PORCINE) 5000 UNIT/ML IJ SOLN
5000.0000 [IU] | Freq: Two times a day (BID) | INTRAMUSCULAR | Status: DC
  Administered 2015-09-02 – 2015-11-20 (×159): 5000 [IU] via SUBCUTANEOUS
  Filled 2015-09-02 (×160): qty 1

## 2015-09-02 MED ORDER — POTASSIUM CHLORIDE 20 MEQ/15ML (10%) PO SOLN
40.0000 meq | Freq: Two times a day (BID) | ORAL | Status: AC
  Administered 2015-09-02 (×2): 40 meq
  Filled 2015-09-02 (×3): qty 30

## 2015-09-02 MED ORDER — FREE WATER
200.0000 mL | Freq: Three times a day (TID) | Status: DC
  Administered 2015-09-02 – 2015-09-16 (×39): 200 mL

## 2015-09-02 NOTE — Progress Notes (Signed)
Subjective:  Pt had HD yesterday. UF achieved was almost 1 liter yesterday. Lethargic today but arousable.   Objective:  Vital signs in last 24 hours:  Temp:  [97.8 F (36.6 C)-98.4 F (36.9 C)] 98.2 F (36.8 C) (10/14 1117) Pulse Rate:  [81-102] 96 (10/14 1117) Resp:  [16-21] 21 (10/14 1117) BP: (70-142)/(33-83) 129/46 mmHg (10/14 1117) SpO2:  [100 %] 100 % (10/14 1117) FiO2 (%):  [6 %] 6 % (10/14 0036)  Weight change:  Filed Weights   08/30/15 0500 08/30/15 1500 08/31/15 0558  Weight: 87 kg (191 lb 12.8 oz) 87 kg (191 lb 12.8 oz) 84 kg (185 lb 3 oz)    Intake/Output: I/O last 3 completed shifts: In: 961 [NG/GT:911; IV Piggyback:50] Out: 609 [Other:609]   Intake/Output this shift:     Physical Exam: General: No acute distress  Head: Normocephalic, atraumatic. +NGT   Eyes: anicteric  Neck: Trach in place  Lungs:  Scattered rhonchi, normal effort  Heart: S1S2 no rubs  Abdomen:  Soft, nontender, BS present  Extremities:  +right upper extremity edema, trace edema of left upper extremity and dependent  Neurologic: Lethargic but arousable  Skin: No apparent rash  Access: R IJ permcath    Basic Metabolic Panel:  Recent Labs Lab 08/27/15 0410 08/28/15 0340 08/28/15 1442 08/29/15 0511 08/30/15 0435 08/31/15 0434 09/02/15 0636  NA 140 143 140 139 140 140 143  K 3.9 3.1* 4.1 3.7 3.3* 3.5 3.4*  CL 108 117* 108 105 108 104 106  CO2 27 23 27 30 29 31  34*  GLUCOSE 88 89 135* 89 120* 134* 139*  BUN 15 21* 32* 23* 37* 29* 38*  CREATININE 0.65 0.74 1.13* 0.81 1.09* 0.84 0.82  CALCIUM 7.8* 6.3* 7.9* 7.9* 7.5* 7.9* 7.8*  MG 1.6* 1.6*  --  1.9 2.1 1.9  --   PHOS 2.7 2.6 3.6 2.4* 3.7 2.8  --     Liver Function Tests:  Recent Labs Lab 08/26/15 1144 08/28/15 1442  ALBUMIN 2.0* 1.9*   No results for input(s): LIPASE, AMYLASE in the last 168 hours. No results for input(s): AMMONIA in the last 168 hours.  CBC:  Recent Labs Lab 08/27/15 0410 08/28/15 1040  08/28/15 1442 09/02/15 0636  WBC 9.9 8.9 10.3 12.9*  NEUTROABS  --  7.6*  --   --   HGB 8.7* 9.3* 8.7* 8.5*  HCT 26.5* 28.7* 26.9* 27.5*  MCV 89.8 90.8 91.2 96.0  PLT 62* 79* 82* 162    Cardiac Enzymes:  Recent Labs Lab 08/30/15 0435  CKTOTAL 15*    BNP: Invalid input(s): POCBNP  CBG:  Recent Labs Lab 09/01/15 0146 09/01/15 0806 09/01/15 1059 09/02/15 09/02/15 0817  GLUCAP 116* 143* 141* 94 127*    Microbiology: Results for orders placed or performed during the hospital encounter of 08/08/2015  Blood Culture (routine x 2)     Status: None   Collection Time: 07/29/2015  8:51 AM  Result Value Ref Range Status   Specimen Description BLOOD Dolores HooseUNKO  Final   Special Requests BOTTLES DRAWN AEROBIC AND ANAEROBIC  3CC  Final   Culture NO GROWTH 5 DAYS  Final   Report Status 08/17/2015 FINAL  Final  Blood Culture (routine x 2)     Status: None   Collection Time: 08/07/2015  9:20 AM  Result Value Ref Range Status   Specimen Description BLOOD LEFT ARM  Final   Special Requests BOTTLES DRAWN AEROBIC AND ANAEROBIC  1CC  Final   Culture  NO GROWTH 5 DAYS  Final   Report Status 08/17/2015 FINAL  Final  Wound culture     Status: None   Collection Time: 08/28/15  9:20 AM  Result Value Ref Range Status   Specimen Description DECUBITIS  Final   Special Requests Normal  Final   Gram Stain   Final    FEW WBC SEEN MANY GRAM NEGATIVE RODS RARE GRAM POSITIVE COCCI    Culture   Final    HEAVY GROWTH ESCHERICHIA COLI MODERATE GROWTH ENTEROBACTER AEROGENES PROTEUS MIRABILIS HEAVY GROWTH ENTEROCOCCUS SPECIES VRE HAVE INTRINSIC RESISTANCE TO MOST COMMONLY USED ANTIBIOTICS AND THE ABILITY TO ACQUIRE RESISTANCE TO MOST AVAILABLE ANTIBIOTICS.    Report Status 08/16/2015 FINAL  Final   Organism ID, Bacteria ESCHERICHIA COLI  Final   Organism ID, Bacteria ENTEROBACTER AEROGENES  Final   Organism ID, Bacteria PROTEUS MIRABILIS  Final   Organism ID, Bacteria ENTEROCOCCUS SPECIES  Final       Susceptibility   Enterobacter aerogenes - MIC*    CEFTAZIDIME <=1 SENSITIVE Sensitive     CEFAZOLIN >=64 RESISTANT Resistant     CEFTRIAXONE <=1 SENSITIVE Sensitive     CIPROFLOXACIN <=0.25 SENSITIVE Sensitive     GENTAMICIN <=1 SENSITIVE Sensitive     IMIPENEM 1 SENSITIVE Sensitive     TRIMETH/SULFA <=20 SENSITIVE Sensitive     * MODERATE GROWTH ENTEROBACTER AEROGENES   Escherichia coli - MIC*    AMPICILLIN <=2 SENSITIVE Sensitive     CEFTAZIDIME <=1 SENSITIVE Sensitive     CEFAZOLIN <=4 SENSITIVE Sensitive     CEFTRIAXONE <=1 SENSITIVE Sensitive     CIPROFLOXACIN <=0.25 SENSITIVE Sensitive     GENTAMICIN <=1 SENSITIVE Sensitive     IMIPENEM <=0.25 SENSITIVE Sensitive     TRIMETH/SULFA <=20 SENSITIVE Sensitive     * HEAVY GROWTH ESCHERICHIA COLI   Proteus mirabilis - MIC*    AMPICILLIN >=32 RESISTANT Resistant     CEFTAZIDIME <=1 SENSITIVE Sensitive     CEFAZOLIN 8 SENSITIVE Sensitive     CEFTRIAXONE <=1 SENSITIVE Sensitive     CIPROFLOXACIN <=0.25 SENSITIVE Sensitive     GENTAMICIN <=1 SENSITIVE Sensitive     IMIPENEM 1 SENSITIVE Sensitive     TRIMETH/SULFA <=20 SENSITIVE Sensitive     * PROTEUS MIRABILIS   Enterococcus species - MIC*    AMPICILLIN >=32 RESISTANT Resistant     VANCOMYCIN >=32 RESISTANT Resistant     GENTAMICIN SYNERGY SENSITIVE Sensitive     TETRACYCLINE Value in next row Resistant      RESISTANT>=16    * HEAVY GROWTH ENTEROCOCCUS SPECIES  MRSA PCR Screening     Status: None   Collection Time: 2015-08-28  2:38 PM  Result Value Ref Range Status   MRSA by PCR NEGATIVE NEGATIVE Final    Comment:        The GeneXpert MRSA Assay (FDA approved for NASAL specimens only), is one component of a comprehensive MRSA colonization surveillance program. It is not intended to diagnose MRSA infection nor to guide or monitor treatment for MRSA infections.   Blood culture (single)     Status: None   Collection Time: 2015/08/28  3:36 PM  Result Value Ref Range Status    Specimen Description BLOOD RIGHT ASSIST CONTROL  Final   Special Requests BOTTLES DRAWN AEROBIC AND ANAEROBIC  Final   Culture NO GROWTH 5 DAYS  Final   Report Status 08/17/2015 FINAL  Final  Wound culture     Status: None  Collection Time: 08/13/15 12:37 PM  Result Value Ref Range Status   Specimen Description WOUND  Final   Special Requests Normal  Final   Gram Stain   Final    FEW WBC SEEN TOO NUMEROUS TO COUNT GRAM NEGATIVE RODS FEW GRAM POSITIVE COCCI    Culture   Final    HEAVY GROWTH ESCHERICHIA COLI MODERATE GROWTH PROTEUS MIRABILIS LIGHT GROWTH KLEBSIELLA PNEUMONIAE MODERATE GROWTH ENTEROCOCCUS GALLINARUM CRITICAL RESULT CALLED TO, READ BACK BY AND VERIFIED WITH: [facility] BORBA AT 1042 08/16/15 DV    Report Status 08/17/2015 FINAL  Final   Organism ID, Bacteria ESCHERICHIA COLI  Final   Organism ID, Bacteria PROTEUS MIRABILIS  Final   Organism ID, Bacteria KLEBSIELLA PNEUMONIAE  Final   Organism ID, Bacteria ENTEROCOCCUS GALLINARUM  Final      Susceptibility   Escherichia coli - MIC*    AMPICILLIN >=32 RESISTANT Resistant     CEFTAZIDIME 4 RESISTANT Resistant     CEFAZOLIN >=64 RESISTANT Resistant     CEFTRIAXONE 16 RESISTANT Resistant     GENTAMICIN 2 SENSITIVE Sensitive     IMIPENEM >=16 RESISTANT Resistant     TRIMETH/SULFA <=20 SENSITIVE Sensitive     Extended ESBL POSITIVE Resistant     PIP/TAZO Value in next row Resistant      RESISTANT>=128    CIPROFLOXACIN Value in next row Sensitive      SENSITIVE<=0.25    * HEAVY GROWTH ESCHERICHIA COLI   Klebsiella pneumoniae - MIC*    AMPICILLIN Value in next row Resistant      SENSITIVE<=0.25    CEFTAZIDIME Value in next row Resistant      SENSITIVE<=0.25    CEFAZOLIN Value in next row Resistant      SENSITIVE<=0.25    CEFTRIAXONE Value in next row Resistant      SENSITIVE<=0.25    CIPROFLOXACIN Value in next row Resistant      SENSITIVE<=0.25    GENTAMICIN Value in next row Sensitive       SENSITIVE<=0.25    IMIPENEM Value in next row Resistant      SENSITIVE<=0.25    TRIMETH/SULFA Value in next row Resistant      SENSITIVE<=0.25    PIP/TAZO Value in next row Resistant      RESISTANT>=128    * LIGHT GROWTH KLEBSIELLA PNEUMONIAE   Proteus mirabilis - MIC*    AMPICILLIN Value in next row Resistant      RESISTANT>=128    CEFTAZIDIME Value in next row Sensitive      RESISTANT>=128    CEFAZOLIN Value in next row Sensitive      RESISTANT>=128    CEFTRIAXONE Value in next row Sensitive      RESISTANT>=128    CIPROFLOXACIN Value in next row Sensitive      RESISTANT>=128    GENTAMICIN Value in next row Sensitive      RESISTANT>=128    IMIPENEM Value in next row Sensitive      RESISTANT>=128    TRIMETH/SULFA Value in next row Sensitive      RESISTANT>=128    PIP/TAZO Value in next row Sensitive      SENSITIVE<=4    * MODERATE GROWTH PROTEUS MIRABILIS   Enterococcus gallinarum - MIC*    AMPICILLIN Value in next row Resistant      SENSITIVE<=4    GENTAMICIN SYNERGY Value in next row Sensitive      SENSITIVE<=4    CIPROFLOXACIN Value in next row Resistant  RESISTANT>=8    TETRACYCLINE Value in next row Resistant      RESISTANT>=16    * MODERATE GROWTH ENTEROCOCCUS GALLINARUM  Wound culture     Status: None   Collection Time: 08/15/15  2:53 PM  Result Value Ref Range Status   Specimen Description WOUND  Final   Special Requests NONE  Final   Gram Stain FEW WBC SEEN NO ORGANISMS SEEN   Final   Culture NO GROWTH 3 DAYS  Final   Report Status 08/18/2015 FINAL  Final  C difficile quick scan w PCR reflex     Status: None   Collection Time: 08/17/15 11:34 AM  Result Value Ref Range Status   C Diff antigen NEGATIVE NEGATIVE Final   C Diff toxin NEGATIVE NEGATIVE Final   C Diff interpretation Negative for C. difficile  Final    Coagulation Studies: No results for input(s): LABPROT, INR in the last 72 hours.  Urinalysis: No results for input(s): COLORURINE,  LABSPEC, PHURINE, GLUCOSEU, HGBUR, BILIRUBINUR, KETONESUR, PROTEINUR, UROBILINOGEN, NITRITE, LEUKOCYTESUR in the last 72 hours.  Invalid input(s): APPERANCEUR    Imaging: No results found.   Medications:   . feeding supplement (JEVITY 1.5 CAL/FIBER) 1,000 mL (08/31/15 1558)   . acidophilus  1 capsule Oral Daily  . amantadine  200 mg Oral Q7 days  . antiseptic oral rinse  7 mL Mouth Rinse q12n4p  . chlorhexidine  15 mL Mouth Rinse BID  . epoetin (EPOGEN/PROCRIT) injection  10,000 Units Intravenous Q T,Th,Sa-HD  . feeding supplement (PRO-STAT SUGAR FREE 64)  30 mL Oral 6 times per day  . free water  100 mL Per Tube 3 times per day  . hydrocortisone  10 mg Per Tube BID  . lidocaine  1 patch Transdermal Q24H  . meropenem (MERREM) IV  500 mg Intravenous q1800  . midodrine  10 mg Oral TID WC  . multivitamin  5 mL Per Tube Daily  . nystatin   Topical TID  . pantoprazole sodium  40 mg Per Tube Q1200  . sodium chloride  10-40 mL Intracatheter Q12H  . sodium hypochlorite   Irrigation BID   sodium chloride, sodium chloride, acetaminophen (TYLENOL) oral liquid 160 mg/5 mL, alteplase, anticoagulant sodium citrate, ipratropium-albuterol, lidocaine (PF), lidocaine-prilocaine, loperamide, oxyCODONE-acetaminophen, pentafluoroprop-tetrafluoroeth, sodium chloride  Assessment/ Plan:  49 y.o. 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  1. End-stage renal disease on hemodialysis on HD  TTHS. The patient has been on dialysis since October of 2014. R IJ permcath.  - Pt had HD yesterday, due for HD again tomorrow.  Husband would like for [place] to switch her schedule to MWF while shes an inpatient, will switch her over on Monday.  2. Anemia of CKD: status post PRBC transfusions. Hgb currently 8.5. - continue epogen 10000 units IV with HD.   3. Secondary hyperparathyroidism: PTH low at 55. - last phos 2.8, will continue to periodically monitor this.  4. Hypotension: required vasopressors earlier in admission. - continue midodrine 10mg  po tid.    LOS: 21 Saburo Luger 10/14/201611:29 AM

## 2015-09-02 NOTE — Progress Notes (Signed)
Nutrition Follow-up  DOCUMENTATION CODES:   Severe malnutrition in context of acute illness/injury  INTERVENTION:   EN: Per RN Brandi, pt tolerating EN well this am with Prostat and free water flushes as ordered. Recommend continuing current TF regimen at this time. RD notes SLP evaluation note and note from MD recommending G-tube placement however husband reluctant currently. Will continue to follow poc and provide recommendations.   NUTRITION DIAGNOSIS:   Inadequate oral intake related to wound healing, acute illness, chronic illness as evidenced by NPO status, estimated needs.  GOAL:   Patient will meet greater than or equal to 90% of their needs  MONITOR:    (Energy Intake, Anthropometrics, Digestive System, Electrolyte/Renal Profile, Glucose Profile)  REASON FOR ASSESSMENT:   Consult Enteral/tube feeding initiation and management  ASSESSMENT:    Pt now on 2A. Per note, care management working on placement at discharge. HD yesterday, UF of 1L removed; noted plan to switch to M, W, F HD schedule this coming week.   Current Nutrition: Pt tolerating Jevity 1.5 at 2040mL/hr with 6 Pro-stat daily per RN Brandi.   Gastrointestinal Profile: Last BM: 09/02/2015 loose stools, +flatus, soft abdomen, hypoactive BS   Medications: Protonix, MVI, KCl,   Electrolyte/Renal Profile and Glucose Profile:   Recent Labs Lab 08/29/15 0511 08/30/15 0435 08/31/15 0434 09/02/15 0636  NA 139 140 140 143  K 3.7 3.3* 3.5 3.4*  CL 105 108 104 106  CO2 30 29 31  34*  BUN 23* 37* 29* 38*  CREATININE 0.81 1.09* 0.84 0.82  CALCIUM 7.9* 7.5* 7.9* 7.8*  MG 1.9 2.1 1.9  --   PHOS 2.4* 3.7 2.8  --   GLUCOSE 89 120* 134* 139*   Protein Profile:  Recent Labs Lab 08/28/15 1442  ALBUMIN 1.9*     Weight Trend since Admission: Filed Weights   08/30/15 0500 08/30/15 1500 08/31/15 0558  Weight: 191 lb 12.8 oz (87 kg) 191 lb 12.8 oz (87 kg) 185 lb 3 oz (84 kg)    Skin:   (stage IV  sacrum, stage III hip, unstageable foot; rectal ulcer from fleixseal, ulcer in nose from dobhoff)   BMI:  Body mass index is 27.33 kg/(m^2).  Estimated Nutritional Needs:   Kcal:  2066-2409 kcals (BEE 1434, 1.2 AF, 1.2-1.4 IF)   Protein:  113-150 g/kg (1.5-2.0 g/kg) but may be closer to 150-188 g (2.0-2.5 g/kg)   Fluid:  1000 mL plus UOP  EDUCATION NEEDS:   No education needs identified at this time   HIGH Care Level  Leda QuailAllyson Halen Mossbarger, RD, LDN Pager [phone]

## 2015-09-02 NOTE — Progress Notes (Signed)
Summary Care Management Handoff Summary  Patient Details  Name: Courtney [name]  MRN: [id number]  DOB: 25-Jan-1966  Eber HongGreene, Katriel Cutsforth R, RN 09/02/2015, 11:11 AM    To Do Should not have any CM needs over the weekend.  Very complicated case

## 2015-09-02 NOTE — Progress Notes (Addendum)
Patient alert, unsure of orientation because patient is nonverbal and does not follow commands. vss at this time. Patient NSR on telemetry. Will continue to assess. Spoke with patient's husband this am with update.  Courtney [name]

## 2015-09-02 NOTE — Progress Notes (Signed)
[facility] Progess Note  Name: Courtney GrinderHedda McMillian [name] MRN: [id number] DOB: October 16, 1966    ADMISSION DATE:  2015-03-14 ADMISSION DATE: 2015-03-14  INITIAL PRESENTATION:  [age] F who has been in medical facilities (hosp, LTAC, rehab) for 2 yrs following gastric bypass surgery with multiple complications. Now with chronic trach, ESRD, profound debilitation, severe sacral pressure ulcer. Was seemingly making progress and transferred to rehab facility approx one week prior to this admission. [facility] with AMS and hypotension. Working dx of severe sepsis/septic shock due to infected sacral pressure ulcer. Also has R side externalized HD cath and tunneled L IJ CVL. She has history of resistant bacterial infections. She is on chronic steroids. Her husband indicates that she has been diagnosed with adrenal insuff at some point during the past 2 yrs  MAJOR EVENTS/TEST RESULTS:  Admission 02/07/14-05/07/14 Admission 07/21/14-09/06/14 Discharged to [facility]. Pt had palliative consult at that time, were asked to sign off by husband.  09/23 CT head: NAD 09/23 EEG: no epileptiform activity 09/23 PRBCs for Hgb 6.4 09/24 bedside debridement of sacral wound. Abscess drained 09/25 Off vasopressors. More alert. No distress. Worsening thrombocytopenia. Vanc DC'd 09/29  Dr. [name] (I.D) excused from the case by patient's husband. 10/02 MRI -multiple infarcts 10/03 tracheal bleeding- transfused platelets 10/03 hospitalist service excused from the case by patient's husband 10/03 Echocardiogram ejection fraction was 55-60%, pulmonary systolic pressure was 39 mmHg 30/8610/08 restart TF's at lower rate, attempt reg HD  10/12 Transferred to med-surg floor. 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 History of sepsis from C. glabrata MRSA PCR 9/23 >> NEG Urine 9/23 >>  Wound (swab) 9/23 >> multiple organisms Wound (debridement) 9/24 >> Enterococcus, K. Pneumoniae, P. Mirabilis, VRE Wound 9/26 >> No growth Blood 9/23 >> neg CDiff 9/27>>neg  ANTIMICROBIALS:  Aztreonam 9/23 >> 9/24 Vanc 9/23 >> 9/25 Vanc 9/26>>9/27 Daptomycin 9/27>> 10/12 Meropenem 9/23 >> 10/14   SUBJECTIVE:  RASS 0. Received 2 doses of morphine overnight.  VITAL SIGNS: Temp:  [97.8 F (36.6 C)-98.4 F (36.9 C)] 98.2 F (36.8 C) (10/14 1117) Pulse Rate:  [81-102] 96 (10/14 1117) Resp:  [16-21] 21 (10/14 1117) BP: (70-142)/(33-77) 129/46 mmHg (10/14 1117) SpO2:  [100 %] 100 % (10/14 1117) FiO2 (%):  [6 %] 6 % (10/14 0036) HEMODYNAMICS:   VENTILATOR SETTINGS: Vent Mode:  [-]  FiO2 (%):  [6 %] 6 % INTAKE / OUTPUT:  Intake/Output Summary (Last 24 hours) at 09/02/15 1222 Last data filed at 09/02/15 0830  Gross per 24 hour  Intake    821 ml  Output    609 ml  Net    212 ml    PHYSICAL EXAMINATION: Physical Examination:   VS: BP 129/46 mmHg  Pulse 96  Temp(Src) 98.2 F (36.8 C) (Oral)  Resp 21  Ht 5\' 9"  (1.753 m)  Wt 84 kg (185 lb 3 oz)  BMI 27.33 kg/m2  SpO2 100%  General Appearance: No resp distress  Neuro: profoundly diffusely weak HEENT: cushingoid facies, PERRLA, EOM intact Neck: trach site clean Pulmonary: clear anteriorly Cardiovascular: reg, no M  Abdomen: soft, NT, +BS Extremities: warm, R>L UE edema   LABS:   LABORATORY PANEL:   CBC  Recent Labs Lab 09/02/15 0636  WBC 12.9*  HGB 8.5*  HCT 27.5*  PLT 162    Chemistries   Recent Labs Lab 08/31/15 0434 09/02/15 0636  NA 140 143  K 3.5 3.4*  CL 104 106  CO2 31 34*  GLUCOSE 134* 139*  BUN 29* 38*  CREATININE 0.84 0.82  CALCIUM 7.9* 7.8*  MG 1.9  --   PHOS 2.8  --      Recent  Labs Lab 09/01/15 0146 09/01/15 0806 09/01/15 1059 09/02/15 09/02/15 0817 09/02/15 1118  GLUCAP 116* 143* 141* 94 127* 125*   No results for input(s): PHART, PCO2ART, PO2ART in the last 168 hours.  Recent Labs Lab 08/28/15 1442  ALBUMIN 1.9*    Cardiac Enzymes No results for input(s): TROPONINI in the last 168 hours.  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 Diff Stool incontinence Dysphagia P:  Cont PPI Avoid anticoagulants Cont Flexiseal SLP eval noted. If continues to fail efforts, will need IR placement of G-tube  HEMATOLOGIC A:  Acute on chronic anemia Thrombocytopenia - HIT panel negative 9/25 P:  DVT px: SCDs, resume SQ heparin 10/14 Monitor CBC intermittently Transfuse per usual guidelines  INFECTIOUS A:  Severe sepsis, resolved Sacral decubitus ulcer with abscess P:  Monitor temp, WBC count Micro and abx as above  ENDOCRINE A:  DM 2, controlled. 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 Profound deconditioning Chronic pain P: RASS goal: 0 Minimize sedating meds Changed analgesics to PRN oxycodone 10/13 Cont PT  Per Neurology, no indication for TEE presently as she would not be a candidate for  anticoagulation  SOCIAL/DISPO: [name] has been somewhat difficult and has fired ID and Hospitalist services We have sought transfer to [facility] but she has been turned down by [facility] and [facility] We are seeking return to [facility] but she cannot go there with NGT and husband has been reluctant to consent to G-tube placement Care management very involved   Billy Fischer, MD PCCM service Mobile [phone] Pager [phone]

## 2015-09-02 NOTE — Evaluation (Signed)
[facility] - Evaluation Patient Details  Name: Courtney GrinderHedda McMillian [name] MRN: [id number] Date of Birth: [birth year]  Today's Date: 09/02/2015 Time: 1000-1100 SLP Time Calculation (min) (ACUTE ONLY): 60 min  Past Medical History:  Past Medical History  Diagnosis Date  . Obesity   . Dyslipidemia   . Hypertension   . Coronary artery disease     s/p BMS 2010 LAD  . Dysrhythmia     ventricular tachycardia resolved after LAD stent and beta blocker  . Diabetes mellitus     with retinopathy, neuropathy and microalbuminemia  . ESRD (end stage renal disease) on dialysis    Past Surgical History:  Past Surgical History  Procedure Laterality Date  . Cholecystectomy  1990  . Pci lad  12/2010  . Colonoscopy  08/2011  . Left heart catheterization with coronary angiogram N/A 01/28/2012    Procedure: LEFT HEART CATHETERIZATION WITH CORONARY ANGIOGRAM;  Surgeon: Lesleigh NoeHenry W Smith III, MD;  [facility];  Service: Cardiovascular;  Laterality: N/A;  . Tracheostomy    . Gastric bypass     HPI:  Pt is a [age] F who has been in medical facilities (hosp, LTAC, rehab) for 2 yrs following gastric bypass surgery with multiple complications. Now with chronic trach, ESRD, profound debilitation, severe sacral pressure ulcer. Was seemingly making progress and transferred to rehab facility approx one week prior to this admission. [facility] with AMS and hypotension. Working dx of severe sepsis/septic shock due to infected sacral pressure ulcer. Also has R side externalized HD cath and tunneled L IJ CVL. She has history of resistant bacterial infections. She is on chronic steroids. Her husband indicates that she has been diagnosed with adrenal insuff at some point during the past 2 yrs. 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 Upon discussion w/ husband, pt was eating an oral diet at [employer] WITH the trach Capped; tolerated this adequately w/ no deficits.  Pt has been responsive to verbal stim and following a few basic commands such as stick out tongue w/ Neurologist.    Assessment / Plan / Recommendation Clinical Impression  Pt was assessed for PMV tolerance this morning in order to gage toleration for wear/use in order to plan for future assessment of any potenial dysphagia w/ po's during MBSS(next week possibly). Upon enteriing room, pt was awake w/ eyes opened, NSG present doing care. Pt smiled when SLP addressed her and sat down beside her. Pt was explained the wear/use of PMV and process of assessment beginning w/ placement of PMV on trach. Instructed pt to indicate to SLP if she felt any discomfort or difficulty w/ her breathing once PMV was placed. Pt's O2 sats were 100%, HR 101. After placing PMV, pt closed her eyes and began nodding her head side to side (slowly) in a "no" fashion. Asked pt several questions re: her toleration of the PMV and/or any discomfort; pt did not attempt to phonate or verbalize and continued to shake head "no". No changes in O2 sats or HR noted, but PMV was removed. After a rest break, PMV placement was attempted again w/ the same results and pt response - in addition, pt has tears in the corners of her eyes. Again no decline in status/ANS, but PMV was removed. NSG was asked to be present during PMV attempt as well. Of note, when PMV was removed, a min. increased rush/release of air was noted at the level of the  trach. but unable to ascertain if d/t any significant buildup of air, "breath-stacking", during PMV placement. Due to pt's response during this PMV eval today, MD was consulted and the above was discussed. MD rec'd for PMV and any swallowing assessment be put on hold until Monday. NSG updated. ST will f/u on Monday. Would like to attempt next PMV eval w/ husband present.     SLP Assessment  Patient needs continued Speech Lanaguage Pathology Services    Follow Up Recommendations   (TBD)    Frequency and Duration   (TBD)   (TBD)   Pertinent Vitals/Pain None seemed indicated; no grimacing    SLP Goals Potential to Achieve Goals (ACUTE ONLY):  (Guarded) Potential Considerations (ACUTE ONLY): Ability to learn/carryover information;Co-morbidities;Cooperation/participation level;Medical prognosis;Severity of impairments;Family/community support   PMSV Trial  PMSV was placed for: ~1-2 mins. Able to redirect subglottic air through upper airway:  (unable to determine) Able to Attain Phonation: No attempt to phonate Able to Expectorate Secretions: No attempts Level of Secretion Expectoration with PMSV: Not observed Intelligibility: Unable to assess (comment) SpO2 During Trial: 100 % Pulse During Trial:  (HR 101) Behavior: Alert;No attempt to communicate;Tearful (no verbalizations or phonations)   Tracheostomy Tube       Vent Dependency  No   Cuff Deflation Trial Behavior: Alert;Tearful Cuff Deflation Trial - Comments: pt has a cuffless trach at baseline      [name],[name] 09/02/2015, 2:56 PM Jerilynn Som, MS, [name]

## 2015-09-03 ENCOUNTER — Inpatient Hospital Stay: Payer: 59

## 2015-09-03 LAB — GLUCOSE, CAPILLARY
GLUCOSE-CAPILLARY: 122 mg/dL — AB (ref 65–99)
Glucose-Capillary: 146 mg/dL — ABNORMAL HIGH (ref 65–99)
Glucose-Capillary: 155 mg/dL — ABNORMAL HIGH (ref 65–99)
Glucose-Capillary: 129 mg/dL — ABNORMAL HIGH (ref 65–99)

## 2015-09-03 LAB — PREPARE RBC (CROSSMATCH)

## 2015-09-03 MED ORDER — SODIUM CHLORIDE 0.9 % IV SOLN: 100.0000 mL | INTRAVENOUS | Status: DC | PRN

## 2015-09-03 MED ORDER — ALTEPLASE 2 MG IJ SOLR: 2.0000 mg | Freq: Once | INTRAMUSCULAR | Status: DC | PRN

## 2015-09-03 MED ORDER — LOPERAMIDE HCL 2 MG PO CAPS
4.0000 mg | ORAL_CAPSULE | Freq: Once | ORAL | Status: AC
Start: 2015-09-03 — End: 2015-09-03
  Administered 2015-09-03: 4 mg via ORAL

## 2015-09-03 MED ORDER — ALBUMIN HUMAN 25 % IV SOLN
12.5000 g | Freq: Once | INTRAVENOUS | Status: AC
  Administered 2015-09-03: 12.5 g via INTRAVENOUS
  Filled 2015-09-03: qty 50

## 2015-09-03 MED ORDER — JEVITY 1.2 CAL PO LIQD
1000.0000 mL | ORAL | Status: DC
  Administered 2015-09-03 – 2015-09-06 (×4): 1000 mL
  Administered 2015-09-07: 17:00:00
  Administered 2015-09-08 (×2): 1000 mL
  Filled 2015-09-03: qty 1000

## 2015-09-03 NOTE — Progress Notes (Signed)
Post hd tx 

## 2015-09-03 NOTE — Progress Notes (Addendum)
Notified Dr. [name] that patient's rectal tube was not functioning properly and stool was going around the tube, was pulled today. MD acknowledged and did not state to put it back in. Also notified MD that patient's husband would like to speak with him. MD will call into room.

## 2015-09-03 NOTE — Progress Notes (Signed)
HD tx start 

## 2015-09-03 NOTE — Progress Notes (Signed)
[facility] Progess Note  Name: Lisette GrinderHedda McMillian Monroe MRN: [id number] DOB: [birth year]    ADMISSION DATE:  2015/11/06 ADMISSION DATE: 2015/11/06  INITIAL PRESENTATION:  [age] F who has been in medical facilities (hosp, LTAC, rehab) for 2 yrs following gastric bypass surgery with multiple complications. Now with chronic trach, ESRD, profound debilitation, severe sacral pressure ulcer. Was seemingly making progress and transferred to rehab facility approx one week prior to this admission. [facility] was [facility] ED with AMS and hypotension. Working dx of severe sepsis/septic shock due to infected sacral pressure ulcer. Also has R side externalized HD cath and tunneled L IJ CVL. She has history of resistant bacterial infections. She is on chronic steroids. Her husband indicates that she has been diagnosed with adrenal insuff at some point during the past 2 yrs  MAJOR EVENTS/TEST RESULTS:  Admission 02/07/14-05/07/14 Admission 07/21/14-09/06/14 Discharged to [facility]. Pt had palliative consult at that time, were asked to sign off by husband.  09/23 CT head: NAD 09/23 EEG: no epileptiform activity 09/23 PRBCs for Hgb 6.4 09/24 bedside debridement of sacral wound. Abscess drained 09/25 Off vasopressors. More alert. No distress. Worsening thrombocytopenia. Vanc DC'd 09/29  Dr. [name] (I.D) excused from the case by patient's husband. 10/02 MRI -multiple infarcts 10/03 tracheal bleeding- transfused platelets 10/03 hospitalist service excused from the case by patient's husband 10/03 Echocardiogram ejection fraction was 55-60%, pulmonary systolic pressure was 39 mmHg 40/9810/08 restart TF's at lower rate, attempt reg HD  10/12 Transferred to med-surg floor. 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 History of sepsis from C. glabrata MRSA PCR 9/23 >> NEG Urine 9/23 >>  Wound (swab) 9/23 >> multiple organisms Wound (debridement) 9/24 >> Enterococcus, K. Pneumoniae, P. Mirabilis, VRE Wound 9/26 >> No growth Blood 9/23 >> neg CDiff 9/27>>neg  ANTIMICROBIALS:  Aztreonam 9/23 >> 9/24 Vanc 9/23 >> 9/25 Vanc 9/26>>9/27 Daptomycin 9/27>> 10/12 Meropenem 9/23 >> 10/14   SUBJECTIVE:  RASS 0. Received 2 doses of morphine overnight.  VITAL SIGNS: Temp:  [98.2 F (36.8 C)-99.3 F (37.4 C)] 98.9 F (37.2 C) (10/15 0945) Pulse Rate:  [96-114] 102 (10/15 0945) Resp:  [16-21] 16 (10/15 0945) BP: (92-148)/(35-85) 124/56 mmHg (10/15 0945) SpO2:  [100 %] 100 % (10/15 0540) FiO2 (%):  [28 %] 28 % (10/15 0016) HEMODYNAMICS:   VENTILATOR SETTINGS: Vent Mode:  [-]  FiO2 (%):  [28 %] 28 % INTAKE / OUTPUT:  Intake/Output Summary (Last 24 hours) at 09/03/15 1049 Last data filed at 09/03/15 0945  Gross per 24 hour  Intake   1050 ml  Output    520 ml  Net    530 ml    PHYSICAL EXAMINATION: Physical Examination:   VS: BP 124/56 mmHg  Pulse 102  Temp(Src) 98.9 F (37.2 C) (Axillary)  Resp 16  Ht 5\' 9"  (1.753 m)  Wt 84 kg (185 lb 3 oz)  BMI 27.33 kg/m2  SpO2 100%  General Appearance: No resp distress  Neuro: profoundly diffusely weak HEENT: cushingoid facies, PERRLA, EOM intact Neck: trach site clean Pulmonary: clear anteriorly Cardiovascular: reg, no M  Abdomen: soft, NT, +BS Extremities: warm, R>L UE edema   LABS:   LABORATORY PANEL:   CBC  Recent  Labs Lab 09/02/15 0636  WBC 12.9*  HGB 8.5*  HCT 27.5*  PLT 162    Chemistries   Recent Labs Lab 08/31/15 0434 09/02/15 0636  NA 140 143  K 3.5 3.4*  CL 104 106  CO2 31 34*  GLUCOSE 134* 139*  BUN 29* 38*  CREATININE 0.84 0.82  CALCIUM 7.9* 7.8*  MG 1.9  --   PHOS 2.8  --       Recent Labs Lab 09/02/15 09/02/15 0817 09/02/15 1118 09/02/15 1649 09/03/15 0300 09/03/15 0804  GLUCAP 94 127* 125* 132* 146* 155*   No results for input(s): PHART, PCO2ART, PO2ART in the last 168 hours.  Recent Labs Lab 08/28/15 1442  ALBUMIN 1.9*    Cardiac Enzymes No results for input(s): TROPONINI in the last 168 hours.  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 Diff Stool incontinence Dysphagia P:  Cont PPI Avoid anticoagulants Cont Flexiseal SLP eval noted. If continues to fail efforts, will need IR placement of G-tube  HEMATOLOGIC A:  Acute on chronic anemia Thrombocytopenia - HIT panel negative 9/25 P:  DVT px: SCDs, resume SQ heparin 10/14 Monitor CBC intermittently Transfuse per usual guidelines  INFECTIOUS A:  Severe sepsis, resolved Sacral decubitus ulcer with abscess P:  Monitor temp, WBC count Micro and abx as above  ENDOCRINE A:  DM 2, controlled. 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 Profound deconditioning Chronic pain P: RASS goal: 0 Minimize sedating meds Changed analgesics to PRN oxycodone 10/13 Cont PT  Per Neurology, no indication for TEE presently as she would not be a candidate for  anticoagulation  SOCIAL/DISPO: [name] has been somewhat difficult and has fired ID and Hospitalist services We have sought transfer to [facility] but she has been turned down by [facility] and [facility] We are seeking return to [facility] but she cannot go there with NGT and husband has been reluctant to consent to G-tube placement Care management very involved   Deep Nicholos Johns, M.D.  PCCM service  Pager [phone]

## 2015-09-03 NOTE — Progress Notes (Signed)
Pre-hd tx 

## 2015-09-03 NOTE — Progress Notes (Signed)
Subjective:  Pt completed HD today.  Difficult to obtain conservative UF target of 1kg due to hypotension. Pt resting comfortably. More interactive today. Husband at bedside.   Objective:  Vital signs in last 24 hours:  Temp:  [98.9 F (37.2 C)-99.3 F (37.4 C)] 98.9 F (37.2 C) (10/15 0945) Pulse Rate:  [102-114] 102 (10/15 0945) Resp:  [16-18] 16 (10/15 0945) BP: (92-148)/(35-85) 124/56 mmHg (10/15 0945) SpO2:  [99 %-100 %] 99 % (10/15 1131) FiO2 (%):  [28 %] 28 % (10/15 1131)  Weight change:  Filed Weights   08/30/15 0500 08/30/15 1500 08/31/15 0558  Weight: 87 kg (191 lb 12.8 oz) 87 kg (191 lb 12.8 oz) 84 kg (185 lb 3 oz)    Intake/Output: I/O last 3 completed shifts: In: 1771 [NG/GT:1671; IV Piggyback:100] Out: 629 [Emesis/NG output:20; Other:609]   Intake/Output this shift:  Total I/O In: 780 [NG/GT:780] Out: 500 [Other:500]  Physical Exam: General: No acute distress  Head: Normocephalic, atraumatic. +NGT   Eyes: anicteric  Neck: Trach in place  Lungs:  Scattered rhonchi, normal effort  Heart: S1S2 no rubs  Abdomen:  Soft, nontender, BS present  Extremities:  +right upper extremity edema, trace edema of left upper extremity and dependent  Neurologic: Lethargic but arousable  Skin: No apparent rash  Access: R IJ permcath    Basic Metabolic Panel:  Recent Labs Lab 08/28/15 0340 08/28/15 1442 08/29/15 0511 08/30/15 0435 08/31/15 0434 09/02/15 0636  NA 143 140 139 140 140 143  K 3.1* 4.1 3.7 3.3* 3.5 3.4*  CL 117* 108 105 108 104 106  CO2 34*  GLUCOSE 89 135* 89 120* 134* 139*  BUN 21* 32* 23* 37* 29* 38*  CREATININE 0.74 1.13* 0.81 1.09* 0.84 0.82  CALCIUM 6.3* 7.9* 7.9* 7.5* 7.9* 7.8*  MG 1.6*  --  1.9 2.1 1.9  --   PHOS 2.6 3.6 2.4* 3.7 2.8  --     Liver Function Tests:  Recent Labs Lab 08/28/15 1442  ALBUMIN 1.9*   No results for input(s): LIPASE, AMYLASE in the last 168 hours. No results for input(s): AMMONIA in  the last 168 hours.  CBC:  Recent Labs Lab 08/28/15 1040 08/28/15 1442 09/02/15 0636  WBC 8.9 10.3 12.9*  NEUTROABS 7.6*  --   --   HGB 9.3* 8.7* 8.5*  HCT 28.7* 26.9* 27.5*  MCV 90.8 91.2 96.0  PLT 79* 82* 162    Cardiac Enzymes:  Recent Labs Lab 08/30/15 0435  CKTOTAL 15*    BNP: Invalid input(s): POCBNP  CBG:  Recent Labs Lab 09/02/15 1118 09/02/15 1649 09/03/15 0300 09/03/15 0804 09/03/15 1215  GLUCAP 125* 132* 146* 155* 129*    Microbiology: Results for orders placed or performed during the hospital encounter of 08/15/15  Blood Culture (routine x 2)     Status: None   Collection Time: 08/15/15  8:51 AM  Result Value Ref Range Status   Specimen Description BLOOD [name]  Final   Special Requests BOTTLES DRAWN AEROBIC AND ANAEROBIC  3CC  Final   Culture NO GROWTH 5 DAYS  Final   Report Status 08/17/2015 FINAL  Final  Blood Culture (routine x 2)     Status: None   Collection Time: 2015/08/15  9:20 AM  Result Value Ref Range Status   Specimen Description BLOOD LEFT ARM  Final   Special Requests BOTTLES DRAWN AEROBIC AND ANAEROBIC  1CC  Final   Culture NO GROWTH 5 DAYS  Final   Report Status 08/17/2015 FINAL  Final  Wound culture     Status: None   Collection Time: 08/01/2015  9:20 AM  Result Value Ref Range Status   Specimen Description DECUBITIS  Final   Special Requests Normal  Final   Gram Stain   Final    FEW WBC SEEN MANY GRAM NEGATIVE RODS RARE GRAM POSITIVE COCCI    Culture   Final    HEAVY GROWTH ESCHERICHIA COLI MODERATE GROWTH ENTEROBACTER AEROGENES PROTEUS MIRABILIS HEAVY GROWTH ENTEROCOCCUS SPECIES VRE HAVE INTRINSIC RESISTANCE TO MOST COMMONLY USED ANTIBIOTICS AND THE ABILITY TO ACQUIRE RESISTANCE TO MOST AVAILABLE ANTIBIOTICS.    Report Status 08/16/2015 FINAL  Final   Organism ID, Bacteria ESCHERICHIA COLI  Final   Organism ID, Bacteria ENTEROBACTER AEROGENES  Final   Organism ID, Bacteria PROTEUS MIRABILIS  Final   Organism ID,  Bacteria ENTEROCOCCUS SPECIES  Final      Susceptibility   Enterobacter aerogenes - MIC*    CEFTAZIDIME <=1 SENSITIVE Sensitive     CEFAZOLIN >=64 RESISTANT Resistant     CEFTRIAXONE <=1 SENSITIVE Sensitive     CIPROFLOXACIN <=0.25 SENSITIVE Sensitive     GENTAMICIN <=1 SENSITIVE Sensitive     IMIPENEM 1 SENSITIVE Sensitive     TRIMETH/SULFA <=20 SENSITIVE Sensitive     * MODERATE GROWTH ENTEROBACTER AEROGENES   Escherichia coli - MIC*    AMPICILLIN <=2 SENSITIVE Sensitive     CEFTAZIDIME <=1 SENSITIVE Sensitive     CEFAZOLIN <=4 SENSITIVE Sensitive     CEFTRIAXONE <=1 SENSITIVE Sensitive     CIPROFLOXACIN <=0.25 SENSITIVE Sensitive     GENTAMICIN <=1 SENSITIVE Sensitive     IMIPENEM <=0.25 SENSITIVE Sensitive     TRIMETH/SULFA <=20 SENSITIVE Sensitive     * HEAVY GROWTH ESCHERICHIA COLI   Proteus mirabilis - MIC*    AMPICILLIN >=32 RESISTANT Resistant     CEFTAZIDIME <=1 SENSITIVE Sensitive     CEFAZOLIN 8 SENSITIVE Sensitive     CEFTRIAXONE <=1 SENSITIVE Sensitive     CIPROFLOXACIN <=0.25 SENSITIVE Sensitive     GENTAMICIN <=1 SENSITIVE Sensitive     IMIPENEM 1 SENSITIVE Sensitive     TRIMETH/SULFA <=20 SENSITIVE Sensitive     * PROTEUS MIRABILIS   Enterococcus species - MIC*    AMPICILLIN >=32 RESISTANT Resistant     VANCOMYCIN >=32 RESISTANT Resistant     GENTAMICIN SYNERGY SENSITIVE Sensitive     TETRACYCLINE Value in next row Resistant      RESISTANT>=16    * HEAVY GROWTH ENTEROCOCCUS SPECIES  MRSA PCR Screening     Status: None   Collection Time: 08/01/2015  2:38 PM  Result Value Ref Range Status   MRSA by PCR NEGATIVE NEGATIVE Final    Comment:        The GeneXpert MRSA Assay (FDA approved for NASAL specimens only), is one component of a comprehensive MRSA colonization surveillance program. It is not intended to diagnose MRSA infection nor to guide or monitor treatment for MRSA infections.   Blood culture (single)     Status: None   Collection Time:  08/01/2015  3:36 PM  Result Value Ref Range Status   Specimen Description BLOOD RIGHT ASSIST CONTROL  Final   Special Requests BOTTLES DRAWN AEROBIC AND ANAEROBIC 5ML  Final   Culture NO GROWTH 5 DAYS  Final   Report Status 08/17/2015 FINAL  Final  Wound culture     Status: None   Collection Time: 08/13/15 12:37 PM  Result Value Ref Range Status   Specimen Description WOUND  Final   Special Requests Normal  Final   Gram Stain   Final    FEW WBC SEEN TOO NUMEROUS TO COUNT GRAM NEGATIVE RODS FEW GRAM POSITIVE COCCI    Culture   Final    HEAVY GROWTH ESCHERICHIA COLI MODERATE GROWTH PROTEUS MIRABILIS LIGHT GROWTH KLEBSIELLA PNEUMONIAE MODERATE GROWTH ENTEROCOCCUS GALLINARUM CRITICAL RESULT CALLED TO, READ BACK BY AND VERIFIED WITH: [facility] BORBA AT 1042 08/16/15 DV    Report Status 08/17/2015 FINAL  Final   Organism ID, Bacteria ESCHERICHIA COLI  Final   Organism ID, Bacteria PROTEUS MIRABILIS  Final   Organism ID, Bacteria KLEBSIELLA PNEUMONIAE  Final   Organism ID, Bacteria ENTEROCOCCUS GALLINARUM  Final      Susceptibility   Escherichia coli - MIC*    AMPICILLIN >=32 RESISTANT Resistant     CEFTAZIDIME 4 RESISTANT Resistant     CEFAZOLIN >=64 RESISTANT Resistant     CEFTRIAXONE 16 RESISTANT Resistant     GENTAMICIN 2 SENSITIVE Sensitive     IMIPENEM >=16 RESISTANT Resistant     TRIMETH/SULFA <=20 SENSITIVE Sensitive     Extended ESBL POSITIVE Resistant     PIP/TAZO Value in next row Resistant      RESISTANT>=128    CIPROFLOXACIN Value in next row Sensitive      SENSITIVE<=0.25    * HEAVY GROWTH ESCHERICHIA COLI   Klebsiella pneumoniae - MIC*    AMPICILLIN Value in next row Resistant      SENSITIVE<=0.25    CEFTAZIDIME Value in next row Resistant      SENSITIVE<=0.25    CEFAZOLIN Value in next row Resistant      SENSITIVE<=0.25    CEFTRIAXONE Value in next row Resistant      SENSITIVE<=0.25    CIPROFLOXACIN Value in next row Resistant      SENSITIVE<=0.25     GENTAMICIN Value in next row Sensitive      SENSITIVE<=0.25    IMIPENEM Value in next row Resistant      SENSITIVE<=0.25    TRIMETH/SULFA Value in next row Resistant      SENSITIVE<=0.25    PIP/TAZO Value in next row Resistant      RESISTANT>=128    * LIGHT GROWTH KLEBSIELLA PNEUMONIAE   Proteus mirabilis - MIC*    AMPICILLIN Value in next row Resistant      RESISTANT>=128    CEFTAZIDIME Value in next row Sensitive      RESISTANT>=128    CEFAZOLIN Value in next row Sensitive      RESISTANT>=128    CEFTRIAXONE Value in next row Sensitive      RESISTANT>=128    CIPROFLOXACIN Value in next row Sensitive      RESISTANT>=128    GENTAMICIN Value in next row Sensitive      RESISTANT>=128    IMIPENEM Value in next row Sensitive      RESISTANT>=128    TRIMETH/SULFA Value in next row Sensitive      RESISTANT>=128    PIP/TAZO Value in next row Sensitive      SENSITIVE<=4    * MODERATE GROWTH PROTEUS MIRABILIS   Enterococcus gallinarum - MIC*    AMPICILLIN Value in next row Resistant      SENSITIVE<=4    GENTAMICIN SYNERGY Value in next row Sensitive      SENSITIVE<=4    CIPROFLOXACIN Value in next row Resistant      RESISTANT>=8    TETRACYCLINE Value  in next row Resistant      RESISTANT>=16    * MODERATE GROWTH ENTEROCOCCUS GALLINARUM  Wound culture     Status: None   Collection Time: 08/15/15  2:53 PM  Result Value Ref Range Status   Specimen Description WOUND  Final   Special Requests NONE  Final   Gram Stain FEW WBC SEEN NO ORGANISMS SEEN   Final   Culture NO GROWTH 3 DAYS  Final   Report Status 08/18/2015 FINAL  Final  C difficile quick scan w PCR reflex     Status: None   Collection Time: 08/17/15 11:34 AM  Result Value Ref Range Status   C Diff antigen NEGATIVE NEGATIVE Final   C Diff toxin NEGATIVE NEGATIVE Final   C Diff interpretation Negative for C. difficile  Final    Coagulation Studies: No results for input(s): LABPROT, INR in the last 72  hours.  Urinalysis: No results for input(s): COLORURINE, LABSPEC, PHURINE, GLUCOSEU, HGBUR, BILIRUBINUR, KETONESUR, PROTEINUR, UROBILINOGEN, NITRITE, LEUKOCYTESUR in the last 72 hours.  Invalid input(s): APPERANCEUR    Imaging: Dg Chest Port 1 View  09/02/2015  CLINICAL DATA:  Chronic respiratory failure EXAM: PORTABLE CHEST 1 VIEW COMPARISON:  08/24/2015 FINDINGS: Tracheostomy at the thoracic inlet. Lungs are clear.  No pleural effusion or pneumothorax. Cardiomegaly. Right IJ venous catheter the cavoatrial junction. Left IJ venous catheter the mid SVC. Enteric tube courses into the distal stomach. IMPRESSION: Tracheostomy the thoracic inlet. Stable support apparatus as above. No evidence of acute cardiopulmonary disease. Electronically Signed   By: Charline Bills M.D.   On: 09/02/2015 12:58   Dg Abd Portable 1v  09/03/2015  CLINICAL DATA:  NG tube placement EXAM: PORTABLE ABDOMEN - 1 VIEW COMPARISON:  08/15/2015 FINDINGS: Feeding tube tip is in the right upper quadrant consistent with location in the distal stomach or proximal duodenum. Diffuse calcification throughout the abdomen likely representing old peritoneal infection. No change since previous study. Bowel gas pattern is grossly unremarkable. IMPRESSION: Feeding tube tip in the right upper quadrant consistent with location in the distal stomach or proximal duodenum. Unchanged appearance of diffuse peritoneal calcification likely indicating old peritonitis. Electronically Signed   By: Burman Nieves M.D.   On: 09/03/2015 04:09     Medications:     . acidophilus  1 capsule Oral Daily  . amantadine  200 mg Oral Q7 days  . antiseptic oral rinse  7 mL Mouth Rinse q12n4p  . chlorhexidine  15 mL Mouth Rinse BID  . epoetin (EPOGEN/PROCRIT) injection  10,000 Units Intravenous Q T,Th,Sa-HD  . feeding supplement (JEVITY 1.2 CAL)  1,000 mL Per Tube Q24H  . feeding supplement (PRO-STAT SUGAR FREE 64)  30 mL Oral 6 times per day  . free  water  200 mL Per Tube 3 times per day  . heparin subcutaneous  5,000 Units Subcutaneous Q12H  . hydrocortisone  10 mg Per Tube BID  . lidocaine  1 patch Transdermal Q24H  . midodrine  10 mg Oral TID WC  . multivitamin  5 mL Per Tube Daily  . nystatin   Topical TID  . pantoprazole sodium  40 mg Per Tube Q1200  . sodium chloride  10-40 mL Intracatheter Q12H  . sodium hypochlorite   Irrigation BID   sodium chloride, sodium chloride, sodium chloride, sodium chloride, acetaminophen (TYLENOL) oral liquid 160 mg/5 mL, alteplase, alteplase, anticoagulant sodium citrate, ipratropium-albuterol, lidocaine (PF), lidocaine-prilocaine, loperamide, oxyCODONE-acetaminophen, pentafluoroprop-tetrafluoroeth, sodium chloride  Assessment/ Plan:  49 y.o. 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  1. End-stage renal disease on hemodialysis on HD TTHS. The patient has been on dialysis since October of 2014. R IJ permcath.  - Pt completed HD today, husband wants her switched to MWF schedule, will switch IHD on Monday.  2. Anemia of CKD: status post PRBC transfusions. Hgb 8.5 at last check. - continue epogen 10000 units IV with HD.   3. Secondary hyperparathyroidism: PTH low at 55. - no indication for binders or activated vitamin D at this point in time.  4. Hypotension: required vasopressors earlier in admission. - continue midodrine 10mg  po tid.  Also using albumin during dialysis.   LOS:  22 Dimond Crotty 10/15/20163:02 PM

## 2015-09-03 NOTE — Progress Notes (Signed)
Pt. Has a stage four on her bottom requested rectal tube from MD. Order Placed

## 2015-09-03 NOTE — Progress Notes (Signed)
HD tx completed.

## 2015-09-03 NOTE — Progress Notes (Signed)
Patient has been alert for a lot of the day and has said a few words to her family. Has grimaced in pain a couple of times after dressing change, given tylenol and percocet with relief. Dressing to sacrum has been changed twice. Patient has had a couple of loose stools, immodium seemed to help. Sinus tach on tele in the 100's to 1 teen's. Will continue to monitor.

## 2015-09-04 LAB — CBC
HEMATOCRIT: 25.1 % — AB (ref 35.0–47.0)
HEMOGLOBIN: 7.9 g/dL — AB (ref 12.0–16.0)
MCH: 30.5 pg (ref 26.0–34.0)
MCHC: 31.6 g/dL — AB (ref 32.0–36.0)
MCV: 96.4 fL (ref 80.0–100.0)
Platelets: 183 10*3/uL (ref 150–440)
RBC: 2.6 MIL/uL — AB (ref 3.80–5.20)
RDW: 23.1 % — ABNORMAL HIGH (ref 11.5–14.5)
WBC: 11.5 10*3/uL — ABNORMAL HIGH (ref 3.6–11.0)

## 2015-09-04 LAB — BASIC METABOLIC PANEL
Anion gap: 4 — ABNORMAL LOW (ref 5–15)
BUN: 38 mg/dL — ABNORMAL HIGH (ref 6–20)
CHLORIDE: 104 mmol/L (ref 101–111)
CO2: 32 mmol/L (ref 22–32)
Calcium: 7.7 mg/dL — ABNORMAL LOW (ref 8.9–10.3)
Creatinine, Ser: 0.87 mg/dL (ref 0.44–1.00)
GFR calc non Af Amer: >60 mL/min (ref >60)
Glucose, Bld: 89 mg/dL (ref 65–99)
POTASSIUM: 4.1 mmol/L (ref 3.5–5.1)
SODIUM: 140 mmol/L (ref 135–145)

## 2015-09-04 LAB — GLUCOSE, CAPILLARY
GLUCOSE-CAPILLARY: 80 mg/dL (ref 65–99)
GLUCOSE-CAPILLARY: 83 mg/dL (ref 65–99)
GLUCOSE-CAPILLARY: 86 mg/dL (ref 65–99)

## 2015-09-04 NOTE — Progress Notes (Signed)
[facility]  Critical Care Medicine Progess Note  Name: Courtney [name] MRN: [id number] DOB: 1966/11/06    ADMISSION DATE:  07-Sep-2015 ADMISSION DATE: 09/07/2015  INTERVAL HISTORY: Yesterday the patient's husband was upset due to placement of a rectal tube by on-call physician, this was removed. It was also noted that the pt developed diarrhea, therefore Jevity was changed from 1.5 with fiber to Jevity 1.2; the rate was decreased to 20 cc/hr and the pt was given a dose of lomotil, which has helped the diarrhea. It was also sent for stool culture and Cdif.   Today discussed with husband options going forward. He would like to transfer to [facility] as it is closer to him, also  [facility] is there so he can have his wife evaluated better and that he can have the [employer] come and check on her status personally.  -He declines transferof his wife back to [place].   -He would like to repeat speech evaluation and Pasey-Muir valve as he hopes that his wife will be able to eat and then will not need a feeding tube. He understands that she may be "stuck" in the hospital as currently no local LTAC is accepting her. He is continuing discussions with [facility].   -He would like his wife restarted on aspirin due to stroke risk. I explained that given her life threatening bleed I did not feel comfortable restarting this medication at this time, but he could revisit this discussion with other physicians in the future.   -Explained that we could begin the transfer process on Monday.   -He requested a culture of his wife's decubitus wound. I explained that it is likely that the would will grown organisms but that that does not mean that she has an active infection. Instead we have to gauge this by other related symptoms.   INITIAL PRESENTATION:  11 F who has been in medical facilities (hosp, LTAC, rehab) for 2 yrs following gastric bypass surgery with multiple complications. Now with chronic trach, ESRD, profound  debilitation, severe sacral pressure ulcer. Was seemingly making progress and transferred to rehab facility approx one week prior to this admission. She was sent to [facility] ED with AMS and hypotension. Working dx of severe sepsis/septic shock due to infected sacral pressure ulcer. Also has R side externalized HD cath and tunneled L IJ CVL. She has history of resistant bacterial infections. She is on chronic steroids. Her husband indicates that she has been diagnosed with adrenal insuff at some point during the past 2 yrs  MAJOR EVENTS/TEST RESULTS:  Admission 02/07/14-05/07/14 Admission 07/21/14-09/06/14 Discharged to [facility]. Pt had palliative consult at that time, were asked to sign off by husband.  09/23 CT head: NAD 09/23 EEG: no epileptiform activity 09/23 PRBCs for Hgb 6.4 09/24 bedside debridement of sacral wound. Abscess drained 09/25 Off vasopressors. More alert. No distress. Worsening thrombocytopenia. Vanc DC'd 09/29  Dr. Sampson Goon (I.D) excused from the case by patient's husband. 10/02 MRI -multiple infarcts 10/03 tracheal bleeding- transfused platelets 10/03 hospitalist service excused from the case by patient's husband 10/03 Echocardiogram ejection fraction was 55-60%, pulmonary systolic pressure was 39 mmHg 40/98 restart TF's at lower rate, attempt reg HD  10/12 Transferred to med-surg floor. Remains on PCCM service 10/14 SLP eval: pt unable to tolerate PMV adequately  INDWELLING DEVICES:: Trach (chronic) placed June 2014 Tunneled R IJ HD cath (chronic) Tunneled L IJ CVL (chronic) L femoral A-line 9/23 >> 9/25  MICRO DATA: History of carbopenem resistant enterococcus and recurrent c.  diff from previous hospitalizations. History of sepsis from C. glabrata MRSA PCR 9/23 >> NEG Urine 9/23 >>  Wound (swab) 9/23 >> multiple organisms Wound (debridement) 9/24 >> Enterococcus, K. Pneumoniae, P. Mirabilis, VRE Wound 9/26 >> No growth Blood 9/23 >> neg CDiff  9/27>>neg  ANTIMICROBIALS:  Aztreonam 9/23 >> 9/24 Vanc 9/23 >> 9/25 Vanc 9/26>>9/27 Daptomycin 9/27>> 10/12 Meropenem 9/23 >> 10/14   SUBJECTIVE:  RASS 0. Received 2 doses of morphine overnight.  VITAL SIGNS: Temp:  [98.9 F (37.2 C)-100.3 F (37.9 C)] 98.9 F (37.2 C) (10/16 1319) Pulse Rate:  [100-115] 100 (10/16 1319) Resp:  [16-22] 16 (10/16 1319) BP: (121-136)/(32-98) 136/32 mmHg (10/16 1319) SpO2:  [100 %] 100 % (10/16 1319) FiO2 (%):  [28 %] 28 % (10/15 2045) HEMODYNAMICS:   VENTILATOR SETTINGS: Vent Mode:  [-]  FiO2 (%):  [28 %] 28 % INTAKE / OUTPUT:  Intake/Output Summary (Last 24 hours) at 09/04/15 1609 Last data filed at 09/04/15 1200  Gross per 24 hour  Intake   1170 ml  Output      0 ml  Net   1170 ml    PHYSICAL EXAMINATION: Physical Examination:   VS: BP 136/32 mmHg  Pulse 100  Temp(Src) 98.9 F (37.2 C) (Oral)  Resp 16  Ht 5\' 9"  (1.753 m)  Wt 185 lb 3 oz (84 kg)  BMI 27.33 kg/m2  SpO2 100%  General Appearance: No resp distress  Neuro: profoundly diffusely weak HEENT: cushingoid facies, PERRLA, EOM intact Neck: trach site clean Pulmonary: clear anteriorly Cardiovascular: reg, no M  Abdomen: soft, NT, +BS Extremities: warm, R>L UE edema   LABS:   LABORATORY PANEL:   CBC  Recent Labs Lab 09/04/15 0351  WBC 11.5*  HGB 7.9*  HCT 25.1*  PLT 183    Chemistries   Recent Labs Lab 08/31/15 0434  09/04/15 0351  NA 140  < > 140  K 3.5  < > 4.1  CL 104  < > 104  CO2 31  < > 32  GLUCOSE 134*  < > 89  BUN 29*  < > 38*  CREATININE 0.84  < > 0.87  CALCIUM 7.9*  < > 7.7*  MG 1.9  --   --   PHOS 2.8  --   --   < > = values in this interval not displayed.   Recent Labs Lab 09/03/15 0804 09/03/15 1215 09/03/15 1737 09/04/15 0012 09/04/15 0801 09/04/15 1545  GLUCAP 155* 129* 122* 86 83 80   No results for input(s): PHART, PCO2ART, PO2ART in the last 168 hours. No results for input(s): AST, ALT, ALKPHOS, BILITOT,  ALBUMIN in the last 168 hours.  Cardiac Enzymes No results for input(s): TROPONINI in the last 168 hours.  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 Diff Stool incontinence Dysphagia P:  Cont PPI Avoid anticoagulants Cont Flexiseal SLP eval noted. If continues to fail efforts, will need IR placement of G-tube  HEMATOLOGIC A:  Acute on chronic anemia Thrombocytopenia -  HIT panel negative 9/25 P:  DVT px: SCDs, resume SQ heparin 10/14 Monitor CBC intermittently Transfuse per usual guidelines  INFECTIOUS A:  Severe sepsis, resolved Sacral decubitus ulcer with abscess P:  Monitor temp, WBC count Micro and abx as above  ENDOCRINE A:  DM 2, controlled. 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 Profound deconditioning Chronic pain P: RASS goal: 0 Minimize sedating meds Changed analgesics to PRN oxycodone 10/13 Cont PT  Per Neurology, no indication for TEE presently as she would not be a candidate for anticoagulation  SOCIAL/DISPO: [name] has been somewhat difficult and has fired ID and Hospitalist services We have sought transfer to [facility] but she has been turned down by [facility] and [facility] We are seeking return to [facility] but she cannot  go there with NGT and husband has been reluctant to consent to G-tube placement Care management very involved   Deep Nicholos Johns, M.D.  PCCM service  Pager [phone]

## 2015-09-04 NOTE — Progress Notes (Signed)
Changed Pt's aerosol water bottle. Suctioned pt. Pt tolerated well. 28% ATC with O2 sat of 100%

## 2015-09-04 NOTE — Progress Notes (Signed)
Subjective:  Pt had HD yesterday. We have been having difficulty with UF and achieving goal due to hypotension. Her father from [place] is visiting.    Objective:  Vital signs in last 24 hours:  Temp:  [98.9 F (37.2 C)-100.3 F (37.9 C)] 98.9 F (37.2 C) (10/16 1319) Pulse Rate:  [100-115] 100 (10/16 1319) Resp:  [16-22] 16 (10/16 1319) BP: (121-136)/(32-98) 136/32 mmHg (10/16 1319) SpO2:  [100 %] 100 % (10/16 1319) FiO2 (%):  [28 %] 28 % (10/15 2045)  Weight change:  Filed Weights   08/30/15 0500 08/30/15 1500 08/31/15 0558  Weight: 87 kg (191 lb 12.8 oz) 87 kg (191 lb 12.8 oz) 84 kg (185 lb 3 oz)    Intake/Output: I/O last 3 completed shifts: In: 2630 [NG/GT:2580; IV Piggyback:50] Out: 520 [Emesis/NG output:20; Other:500]   Intake/Output this shift:  Total I/O In: 370 [NG/GT:370] Out: -   Physical Exam: General: No acute distress  Head: Normocephalic, atraumatic. +NGT   Eyes: anicteric  Neck: Trach in place  Lungs:  Scattered rhonchi, normal effort  Heart: S1S2 no rubs  Abdomen:  Soft, nontender, BS present  Extremities:  +right upper extremity edema, trace edema of left upper extremity and dependent  Neurologic: Lethargic but arousable  Skin: No apparent rash  Access: R IJ permcath    Basic Metabolic Panel:  Recent Labs Lab 08/28/15 1442 08/29/15 0511 08/30/15 0435 08/31/15 0434 09/02/15 0636 09/04/15 0351  NA 140 139 140 140 143 140  K 4.1 3.7 3.3* 3.5 3.4* 4.1  CL 108 105 108 104 106 104  CO2 27 30 29 31  34* 32  GLUCOSE 135* 89 120* 134* 139* 89  BUN 32* 23* 37* 29* 38* 38*  CREATININE 1.13* 0.81 1.09* 0.84 0.82 0.87  CALCIUM 7.9* 7.9* 7.5* 7.9* 7.8* 7.7*  MG  --  1.9 2.1 1.9  --   --   PHOS 3.6 2.4* 3.7 2.8  --   --     Liver Function Tests:  Recent Labs Lab 08/28/15 1442  ALBUMIN 1.9*   No results for input(s): LIPASE, AMYLASE in the last 168 hours. No results for input(s): AMMONIA in the last 168 hours.  CBC:  Recent  Labs Lab 08/28/15 1442 09/02/15 0636 09/04/15 0351  WBC 10.3 12.9* 11.5*  HGB 8.7* 8.5* 7.9*  HCT 26.9* 27.5* 25.1*  MCV 91.2 96.0 96.4  PLT 82* 162 183    Cardiac Enzymes:  Recent Labs Lab 08/30/15 0435  CKTOTAL 15*    BNP: Invalid input(s): POCBNP  CBG:  Recent Labs Lab 09/03/15 0804 09/03/15 1215 09/03/15 1737 09/04/15 0012 09/04/15 0801  GLUCAP 155* 129* 122* 86 83    Microbiology: Results for orders placed or performed during the hospital encounter of 08/11/2015  Blood Culture (routine x 2)     Status: None   Collection Time: 08/19/2015  8:51 AM  Result Value Ref Range Status   Specimen Description BLOOD [name]  Final   Special Requests BOTTLES DRAWN AEROBIC AND ANAEROBIC  3CC  Final   Culture NO GROWTH 5 DAYS  Final   Report Status 08/17/2015 FINAL  Final  Blood Culture (routine x 2)     Status: None   Collection Time: 07/21/2015  9:20 AM  Result Value Ref Range Status   Specimen Description BLOOD LEFT ARM  Final   Special Requests BOTTLES DRAWN AEROBIC AND ANAEROBIC  1CC  Final   Culture NO GROWTH 5 DAYS  Final   Report Status 08/17/2015 FINAL  Final  Wound culture     Status: None   Collection Time: 07/31/2015  9:20 AM  Result Value Ref Range Status   Specimen Description DECUBITIS  Final   Special Requests Normal  Final   Gram Stain   Final    FEW WBC SEEN MANY GRAM NEGATIVE RODS RARE GRAM POSITIVE COCCI    Culture   Final    HEAVY GROWTH ESCHERICHIA COLI MODERATE GROWTH ENTEROBACTER AEROGENES PROTEUS MIRABILIS HEAVY GROWTH ENTEROCOCCUS SPECIES VRE HAVE INTRINSIC RESISTANCE TO MOST COMMONLY USED ANTIBIOTICS AND THE ABILITY TO ACQUIRE RESISTANCE TO MOST AVAILABLE ANTIBIOTICS.    Report Status 08/16/2015 FINAL  Final   Organism ID, Bacteria ESCHERICHIA COLI  Final   Organism ID, Bacteria ENTEROBACTER AEROGENES  Final   Organism ID, Bacteria PROTEUS MIRABILIS  Final   Organism ID, Bacteria ENTEROCOCCUS SPECIES  Final      Susceptibility    Enterobacter aerogenes - MIC*    CEFTAZIDIME <=1 SENSITIVE Sensitive     CEFAZOLIN >=64 RESISTANT Resistant     CEFTRIAXONE <=1 SENSITIVE Sensitive     CIPROFLOXACIN <=0.25 SENSITIVE Sensitive     GENTAMICIN <=1 SENSITIVE Sensitive     IMIPENEM 1 SENSITIVE Sensitive     TRIMETH/SULFA <=20 SENSITIVE Sensitive     * MODERATE GROWTH ENTEROBACTER AEROGENES   Escherichia coli - MIC*    AMPICILLIN <=2 SENSITIVE Sensitive     CEFTAZIDIME <=1 SENSITIVE Sensitive     CEFAZOLIN <=4 SENSITIVE Sensitive     CEFTRIAXONE <=1 SENSITIVE Sensitive     CIPROFLOXACIN <=0.25 SENSITIVE Sensitive     GENTAMICIN <=1 SENSITIVE Sensitive     IMIPENEM <=0.25 SENSITIVE Sensitive     TRIMETH/SULFA <=20 SENSITIVE Sensitive     * HEAVY GROWTH ESCHERICHIA COLI   Proteus mirabilis - MIC*    AMPICILLIN >=32 RESISTANT Resistant     CEFTAZIDIME <=1 SENSITIVE Sensitive     CEFAZOLIN 8 SENSITIVE Sensitive     CEFTRIAXONE <=1 SENSITIVE Sensitive     CIPROFLOXACIN <=0.25 SENSITIVE Sensitive     GENTAMICIN <=1 SENSITIVE Sensitive     IMIPENEM 1 SENSITIVE Sensitive     TRIMETH/SULFA <=20 SENSITIVE Sensitive     * PROTEUS MIRABILIS   Enterococcus species - MIC*    AMPICILLIN >=32 RESISTANT Resistant     VANCOMYCIN >=32 RESISTANT Resistant     GENTAMICIN SYNERGY SENSITIVE Sensitive     TETRACYCLINE Value in next row Resistant      RESISTANT>=16    * HEAVY GROWTH ENTEROCOCCUS SPECIES  MRSA PCR Screening     Status: None   Collection Time: 07/23/2015  2:38 PM  Result Value Ref Range Status   MRSA by PCR NEGATIVE NEGATIVE Final    Comment:        The GeneXpert MRSA Assay (FDA approved for NASAL specimens only), is one component of a comprehensive MRSA colonization surveillance program. It is not intended to diagnose MRSA infection nor to guide or monitor treatment for MRSA infections.   Blood culture (single)     Status: None   Collection Time: 08/07/2015  3:36 PM  Result Value Ref Range Status   Specimen  Description BLOOD RIGHT ASSIST CONTROL  Final   Special Requests BOTTLES DRAWN AEROBIC AND ANAEROBIC  Final   Culture NO GROWTH 5 DAYS  Final   Report Status 08/17/2015 FINAL  Final  Wound culture     Status: None   Collection Time: 08/13/15 12:37 PM  Result Value Ref Range Status  Specimen Description WOUND  Final   Special Requests Normal  Final   Gram Stain   Final    FEW WBC SEEN TOO NUMEROUS TO COUNT GRAM NEGATIVE RODS FEW GRAM POSITIVE COCCI    Culture   Final    HEAVY GROWTH ESCHERICHIA COLI MODERATE GROWTH PROTEUS MIRABILIS LIGHT GROWTH KLEBSIELLA PNEUMONIAE MODERATE GROWTH ENTEROCOCCUS GALLINARUM CRITICAL RESULT CALLED TO, READ BACK BY AND VERIFIED WITH: [facility] Center[facility]DRA BORBA AT 1042 08/16/15 DV    Report Status 08/17/2015 FINAL  Final   Organism ID, Bacteria ESCHERICHIA COLI  Final   Organism ID, Bacteria PROTEUS MIRABILIS  Final   Organism ID, Bacteria KLEBSIELLA PNEUMONIAE  Final   Organism ID, Bacteria ENTEROCOCCUS GALLINARUM  Final      Susceptibility   Escherichia coli - MIC*    AMPICILLIN >=32 RESISTANT Resistant     CEFTAZIDIME 4 RESISTANT Resistant     CEFAZOLIN >=64 RESISTANT Resistant     CEFTRIAXONE 16 RESISTANT Resistant     GENTAMICIN 2 SENSITIVE Sensitive     IMIPENEM >=16 RESISTANT Resistant     TRIMETH/SULFA <=20 SENSITIVE Sensitive     Extended ESBL POSITIVE Resistant     PIP/TAZO Value in next row Resistant      RESISTANT>=128    CIPROFLOXACIN Value in next row Sensitive      SENSITIVE<=0.25    * HEAVY GROWTH ESCHERICHIA COLI   Klebsiella pneumoniae - MIC*    AMPICILLIN Value in next row Resistant      SENSITIVE<=0.25    CEFTAZIDIME Value in next row Resistant      SENSITIVE<=0.25    CEFAZOLIN Value in next row Resistant      SENSITIVE<=0.25    CEFTRIAXONE Value in next row Resistant      SENSITIVE<=0.25    CIPROFLOXACIN Value in next row Resistant      SENSITIVE<=0.25    GENTAMICIN Value in next row Sensitive      SENSITIVE<=0.25     IMIPENEM Value in next row Resistant      SENSITIVE<=0.25    TRIMETH/SULFA Value in next row Resistant      SENSITIVE<=0.25    PIP/TAZO Value in next row Resistant      RESISTANT>=128    * LIGHT GROWTH KLEBSIELLA PNEUMONIAE   Proteus mirabilis - MIC*    AMPICILLIN Value in next row Resistant      RESISTANT>=128    CEFTAZIDIME Value in next row Sensitive      RESISTANT>=128    CEFAZOLIN Value in next row Sensitive      RESISTANT>=128    CEFTRIAXONE Value in next row Sensitive      RESISTANT>=128    CIPROFLOXACIN Value in next row Sensitive      RESISTANT>=128    GENTAMICIN Value in next row Sensitive      RESISTANT>=128    IMIPENEM Value in next row Sensitive      RESISTANT>=128    TRIMETH/SULFA Value in next row Sensitive      RESISTANT>=128    PIP/TAZO Value in next row Sensitive      SENSITIVE<=4    * MODERATE GROWTH PROTEUS MIRABILIS   Enterococcus gallinarum - MIC*    AMPICILLIN Value in next row Resistant      SENSITIVE<=4    GENTAMICIN SYNERGY Value in next row Sensitive      SENSITIVE<=4    CIPROFLOXACIN Value in next row Resistant      RESISTANT>=8    TETRACYCLINE Value in next row Resistant  RESISTANT>=16    * MODERATE GROWTH ENTEROCOCCUS GALLINARUM  Wound culture     Status: None   Collection Time: 08/15/15  2:53 PM  Result Value Ref Range Status   Specimen Description WOUND  Final   Special Requests NONE  Final   Gram Stain FEW WBC SEEN NO ORGANISMS SEEN   Final   Culture NO GROWTH 3 DAYS  Final   Report Status 08/18/2015 FINAL  Final  C difficile quick scan w PCR reflex     Status: None   Collection Time: 08/17/15 11:34 AM  Result Value Ref Range Status   C Diff antigen NEGATIVE NEGATIVE Final   C Diff toxin NEGATIVE NEGATIVE Final   C Diff interpretation Negative for C. difficile  Final  Stool culture     Status: None (Preliminary result)   Collection Time: 09/03/15  3:51 PM  Result Value Ref Range Status   Specimen Description STOOL  Final    Special Requests Immunocompromised  Final   Culture   Final    HOLDING FOR POSSIBLE PATHOGEN No Pathogenic E. coli detected NO CAMPYLOBACTER DETECTED    Report Status PENDING  Incomplete    Coagulation Studies: No results for input(s): LABPROT, INR in the last 72 hours.  Urinalysis: No results for input(s): COLORURINE, LABSPEC, PHURINE, GLUCOSEU, HGBUR, BILIRUBINUR, KETONESUR, PROTEINUR, UROBILINOGEN, NITRITE, LEUKOCYTESUR in the last 72 hours.  Invalid input(s): APPERANCEUR    Imaging: Dg Abd Portable 1v  09/03/2015  CLINICAL DATA:  NG tube placement EXAM: PORTABLE ABDOMEN - 1 VIEW COMPARISON:  08/15/2015 FINDINGS: Feeding tube tip is in the right upper quadrant consistent with location in the distal stomach or proximal duodenum. Diffuse calcification throughout the abdomen likely representing old peritoneal infection. No change since previous study. Bowel gas pattern is grossly unremarkable. IMPRESSION: Feeding tube tip in the right upper quadrant consistent with location in the distal stomach or proximal duodenum. Unchanged appearance of diffuse peritoneal calcification likely indicating old peritonitis. Electronically Signed   By: Burman Nieves M.D.   On: 09/03/2015 04:09     Medications:     . acidophilus  1 capsule Oral Daily  . amantadine  200 mg Oral Q7 days  . antiseptic oral rinse  7 mL Mouth Rinse q12n4p  . chlorhexidine  15 mL Mouth Rinse BID  . epoetin (EPOGEN/PROCRIT) injection  10,000 Units Intravenous Q T,Th,Sa-HD  . feeding supplement (JEVITY 1.2 CAL)  1,000 mL Per Tube Q24H  . feeding supplement (PRO-STAT SUGAR FREE 64)  30 mL Oral 6 times per day  . free water  200 mL Per Tube 3 times per day  . heparin subcutaneous  5,000 Units Subcutaneous Q12H  . hydrocortisone  10 mg Per Tube BID  . lidocaine  1 patch Transdermal Q24H  . midodrine  10 mg Oral TID WC  . multivitamin  5 mL Per Tube Daily  . nystatin   Topical TID  . pantoprazole sodium  40 mg Per Tube  Q1200  . sodium chloride  10-40 mL Intracatheter Q12H  . sodium hypochlorite   Irrigation BID   sodium chloride, sodium chloride, sodium chloride, sodium chloride, acetaminophen (TYLENOL) oral liquid 160 mg/5 mL, alteplase, alteplase, anticoagulant sodium citrate, ipratropium-albuterol, lidocaine (PF), lidocaine-prilocaine, loperamide, oxyCODONE-acetaminophen, pentafluoroprop-tetrafluoroeth, sodium chloride  Assessment/ Plan:  49 y.o. 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  1. End-stage renal disease on hemodialysis on HD MWF. The patient has been on dialysis since October of 2014. R IJ permcath.  - Pt had HD yesterday, husband wants [place] to switch her to HD on MWF, will plan to switch her to this tomorrow.  2. Anemia of CKD: status post PRBC transfusions. Hgb down to 7.9 - continue epogen 10000 units IV with HD.   3. Secondary hyperparathyroidism: PTH low at 55, phos also low. - no indication for binders or activated vitamin D at this point in time given low phos and PTH.  4. Hypotension: required vasopressors earlier in admission. - continue midodrine 10mg  po tid.  We have also been giving PRN albumin to keep BP up during HD, but despite this UF has been limited by hypotension.  5.  Dispo:  Unclear at this time.   LOS: 23 Tina Gruner 10/16/20161:56 PM

## 2015-09-05 LAB — GLUCOSE, CAPILLARY
GLUCOSE-CAPILLARY: 67 mg/dL (ref 65–99)
GLUCOSE-CAPILLARY: 70 mg/dL (ref 65–99)
GLUCOSE-CAPILLARY: 74 mg/dL (ref 65–99)
GLUCOSE-CAPILLARY: 58 mg/dL — AB (ref 65–99)
Glucose-Capillary: 57 mg/dL — ABNORMAL LOW (ref 65–99)
Glucose-Capillary: 74 mg/dL (ref 65–99)
Glucose-Capillary: 74 mg/dL (ref 65–99)

## 2015-09-05 LAB — BASIC METABOLIC PANEL
Anion gap: 5 (ref 5–15)
BUN: 53 mg/dL — ABNORMAL HIGH (ref 6–20)
CHLORIDE: 103 mmol/L (ref 101–111)
CO2: 33 mmol/L — AB (ref 22–32)
CREATININE: 1.25 mg/dL — AB (ref 0.44–1.00)
Calcium: 7.9 mg/dL — ABNORMAL LOW (ref 8.9–10.3)
GFR calc non Af Amer: 49 mL/min — ABNORMAL LOW (ref >60)
GFR, EST AFRICAN AMERICAN: 57 mL/min — AB (ref >60)
GLUCOSE: 65 mg/dL (ref 65–99)
Potassium: 4.4 mmol/L (ref 3.5–5.1)
Sodium: 141 mmol/L (ref 135–145)

## 2015-09-05 LAB — CBC
HEMATOCRIT: 25.2 % — AB (ref 35.0–47.0)
HEMOGLOBIN: 8.1 g/dL — AB (ref 12.0–16.0)
MCH: 30.6 pg (ref 26.0–34.0)
MCHC: 32 g/dL (ref 32.0–36.0)
MCV: 95.8 fL (ref 80.0–100.0)
Platelets: 198 10*3/uL (ref 150–440)
RBC: 2.63 MIL/uL — ABNORMAL LOW (ref 3.80–5.20)
RDW: 22.2 % — AB (ref 11.5–14.5)
WBC: 11.1 10*3/uL — ABNORMAL HIGH (ref 3.6–11.0)

## 2015-09-05 MED ORDER — HEPARIN SODIUM (PORCINE) 1000 UNIT/ML IJ SOLN
1000.0000 [IU] | Freq: Once | INTRAMUSCULAR | Status: AC
  Administered 2015-09-05: 4500 [IU]

## 2015-09-05 MED ORDER — DEXTROSE 50 % IV SOLN
INTRAVENOUS | Status: AC
  Filled 2015-09-05: qty 50

## 2015-09-05 MED ORDER — ALBUMIN HUMAN 25 % IV SOLN
25.0000 g | Freq: Once | INTRAVENOUS | Status: AC
  Administered 2015-09-05: 25 g via INTRAVENOUS
  Filled 2015-09-05: qty 100

## 2015-09-05 NOTE — Progress Notes (Signed)
Pt BS 58, prostat given and dr. Belia HemanKasa called. Bs rechecked and 74. Patient is asymptomatic, resting comfortably in bed. Trudee KusterBrandi R Mansfield

## 2015-09-05 NOTE — Progress Notes (Addendum)
Speech Language Pathology Treatment:    Patient Details Name: Courtney GrinderHedda McMillian Monroe MRN: [id number] DOB: 26-Apr-1966 Today's Date: 09/05/2015 Time: 1600-1700 SLP Time Calculation (min) (ACUTE ONLY): 60 min  Assessment / Plan / Recommendation Clinical Impression  Pt appeared to adequately tolerate placement of PMV for ~25-30 mins. W/out desaturation or discomfort noted; pt did not effectively cough and clear moderate (sounding) tracheal secretions during placement. Pt gave ONE phonation upon request once PMV was placed then nothing more despite encouragement from SLP and RT. Pt then often shook her head "no" to questions and communication w/ her and did not make an effort to phonate or verbally communicate; she often looked away from the SLP. Pt was not tearful but appeared labile as well. PMV was removed after ~25-30 mins w/ no distress or decline in status noted. MD consulted after session; NSG updated. Will plan to use/wear PMV tomorrow and hopefully go to Radiology for a MBSS at the same time w/ RT present. MD agreed.    HPI     Pertinent Vitals  O2 sats remained in the low-mid 90's; HR 112-113. No decline in respiratory presentation was noted during placement.   SLP Plan  Consult other service (comment) (will determine goals)    Recommendations Diet recommendations: NPO; PMV use w/ SLP tomorrow for MBSS.      Patient may use Passy-Muir Speech Valve: with SLP only;Caregiver trained to provide supervision PMSV Supervision: Full       General recommendations:  (TBD) Oral Care Recommendations: Oral care QID;Staff/trained caregiver to provide oral care Follow up Recommendations: Skilled Nursing facility (TBD) Plan: Consult other service (comment) (will determine goals)    GO    Jerilynn SomKatherine Shamyah Stantz, MS, [name]  Nakari Bracknell 09/05/2015, 5:04 PM

## 2015-09-05 NOTE — Progress Notes (Signed)
[facility] Critical Care Medicine Progess Note  Name: Courtney [name] MRN: [id number] DOB: 25-May-1966    ADMISSION DATE:  09/04/2015 ADMISSION DATE: Sep 04, 2015  INTERVAL HISTORY:  Today- discussed with husband plan with PM valve and assess for swallowing study at some point Patient tearful when attempted PM valve last week.    On 10/16  the patient's husband was upset due to placement of a rectal tube by on-call physician, this was removed. It was also noted that the pt developed diarrhea, therefore Jevity was changed from 1.5 with fiber to Jevity 1.2; the rate was decreased to 20 cc/hr and the pt was given a dose of lomotil, which has helped the diarrhea. It was also sent for stool culture and Cdif.   On 10/17 discussed with husband options going forward. He would like to transfer to [facility] as it is closer to him, also  [facility] is there so he can have his wife evaluated better and that he can have the [employer] come and check on her status personally-I have discussed case with Hospitalist and they pretty much have refused to accept her at this time.   -He declines transferof his wife back to [place].   -He would like to repeat speech evaluation and Pasey-Muir valve as he hopes that his wife will be able to eat and then will not need a feeding tube. He understands that she may be "stuck" in the hospital as currently no local LTAC is accepting her. He is continuing discussions with [facility].   -He would like his wife restarted on aspirin due to stroke risk. I explained that given her life threatening bleed I did not feel comfortable restarting this medication at this time, but he could revisit this discussion with other physicians in the future.    INITIAL PRESENTATION:  46 F who has been in medical facilities (hosp, LTAC, rehab) for 2 yrs following gastric bypass surgery with multiple complications. Now with chronic trach, ESRD, profound debilitation, severe sacral pressure ulcer. Was  seemingly making progress and transferred to rehab facility approx one week prior to this admission. She was sent to [facility] ED with AMS and hypotension. Working dx of severe sepsis/septic shock due to infected sacral pressure ulcer. Also has R side externalized HD cath and tunneled L IJ CVL. She has history of resistant bacterial infections. She is on chronic steroids. Her husband indicates that she has been diagnosed with adrenal insuff at some point during the past 2 yrs  MAJOR EVENTS/TEST RESULTS:  Admission 02/07/14-05/07/14 Admission 07/21/14-09/06/14 Discharged to [facility]. Pt had palliative consult at that time, were asked to sign off by husband.  09/23 CT head: NAD 09/23 EEG: no epileptiform activity 09/23 PRBCs for Hgb 6.4 09/24 bedside debridement of sacral wound. Abscess drained 09/25 Off vasopressors. More alert. No distress. Worsening thrombocytopenia. Vanc DC'd 09/29  Dr. Sampson Goon (I.D) excused from the case by patient's husband. 10/02 MRI -multiple infarcts 10/03 tracheal bleeding- transfused platelets 10/03 hospitalist service excused from the case by patient's husband 10/03 Echocardiogram ejection fraction was 55-60%, pulmonary systolic pressure was 39 mmHg 40/98 restart TF's at lower rate, attempt reg HD  10/12 Transferred to med-surg floor. 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 History of sepsis from C. glabrata MRSA PCR 9/23 >> NEG Urine 9/23 >>  Wound (swab) 9/23 >> multiple organisms Wound (debridement) 9/24 >> Enterococcus, K. Pneumoniae, P. Mirabilis, VRE Wound 9/26 >> No growth Blood 9/23 >> neg CDiff  9/27>>neg  ANTIMICROBIALS:  Aztreonam 9/23 >> 9/24 Vanc 9/23 >> 9/25 Vanc 9/26>>9/27 Daptomycin 9/27>> 10/12 Meropenem 9/23 >> 10/14   SUBJECTIVE:  RASS 0. Nonverbal, does not follow commands, opens eyes to vocal stimuli   VITAL SIGNS: Temp:  [98.8 F (37.1 C)-99 F (37.2 C)] 99 F (37.2 C) (10/17 0602) Pulse Rate:  [100-108] 100 (10/17 1315) Resp:  [20-24] 22 (10/17 1315) BP: (78-164)/(25-107) 109/30 mmHg (10/17 1315) SpO2:  [99 %-100 %] 100 % (10/17 1035) FiO2 (%):  [28 %] 28 % (10/17 0030) HEMODYNAMICS:   VENTILATOR SETTINGS: Vent Mode:  [-]  FiO2 (%):  [28 %] 28 % INTAKE / OUTPUT:  Intake/Output Summary (Last 24 hours) at 09/05/15 1327 Last data filed at 09/04/15 1700  Gross per 24 hour  Intake    330 ml  Output      0 ml  Net    330 ml    PHYSICAL EXAMINATION: Physical Examination:   VS: BP 109/30 mmHg  Pulse 100  Temp(Src) 99 F (37.2 C) (Oral)  Resp 22  Ht 5\' 9"  (1.753 m)  Wt 185 lb 3 oz (84 kg)  BMI 27.33 kg/m2  SpO2 100%  General Appearance: No resp distress  Neuro: profoundly diffusely weak HEENT: cushingoid facies, PERRLA, EOM intact Neck: trach site clean Pulmonary: clear anteriorly Cardiovascular: reg, no M  Abdomen: soft, NT, +BS Extremities: warm, R>L UE edema    LABORATORY PANEL:   CBC  Recent Labs Lab 09/05/15 0450  WBC 11.1*  HGB 8.1*  HCT 25.2*  PLT 198    Chemistries   Recent Labs Lab 08/31/15 0434  09/05/15 0450  NA 140  < > 141  K 3.5  < > 4.4  CL 104  < > 103  CO2 31  < > 33*  GLUCOSE 134*  < > 65  BUN 29*  < > 53*  CREATININE 0.84  < > 1.25*  CALCIUM 7.9*  < > 7.9*  MG 1.9  --   --   PHOS 2.8  --   --   < > = values in this interval not displayed.   Recent Labs Lab 09/04/15 1545 09/05/15 0027 09/05/15 0116 09/05/15 0137 09/05/15 0749 09/05/15 1126  GLUCAP 80 57* 67 74 70 74   No results for input(s): PHART, PCO2ART, PO2ART in the last 168 hours. No results for input(s): AST, ALT, ALKPHOS,  BILITOT, ALBUMIN in the last 168 hours.  Cardiac Enzymes No results for input(s): TROPONINI in the last 168 hours.  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 Diff Stool incontinence Dysphagia P:  Cont PPI Avoid anticoagulants Plan for PM valve assessment again today  HEMATOLOGIC A:  Acute on chronic anemia Thrombocytopenia - HIT panel negative  9/25 P:  DVT px: SCDs, resume SQ heparin 10/14 Monitor CBC intermittently Transfuse per usual guidelines plt count 198 10/17  INFECTIOUS A:  Severe sepsis, resolved Sacral decubitus ulcer with abscess P:  Monitor temp, WBC count Micro and abx as above-off abx now  ENDOCRINE A:  DM 2, controlled. 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 Profound deconditioning Chronic pain P: RASS goal: 0 Minimize sedating meds Changed analgesics to PRN oxycodone 10/13 Cont PT  Per Neurology, no indication for TEE presently as she would not be a candidate for anticoagulation  SOCIAL/DISPO: [name] has been somewhat difficult and has fired ID and Hospitalist services We have sought transfer to [facility] but she has been turned down by [facility] and [facility] We are seeking return to [facility] but she cannot go there with  NGT and husband has been reluctant to consent to G-tube placement Care management very involved  I have personally obtained a history, examined the patient, evaluated Pertinent laboratory and RadioGraphic/imaging results, and  formulated the assessment and plan   The Patient requires high complexity decision making for assessment and support, frequent evaluation and titration of therapies.  Husband satisfied with Plan of action and management. All questions answered  Lucie Leather, M.D.  [facility]  Medical Director [facility] Medical Director [facility] Cardio-Pulmonary Department

## 2015-09-05 NOTE — Clinical Social Work Note (Signed)
CSW has updated Courtney [name] at [employer]. Patient currently without a permanent feeding solution. Patient's [facility] but [facility]. [place] SpanielMonica Kenniya Westrich MSW,LCSW [phone]

## 2015-09-05 NOTE — Progress Notes (Signed)
Unable to assess orientation, patient is nonverbal. Patient is laying in bed with no distress, patient repostitioned and dressings on sacrum, r hip, and r foot changed. Tube feedings still infusing at this time. Dialysis in progress with dialysis nurse. Patient NSR on telemetry. Will continue to assess. Trudee KusterBrandi R Mansfield

## 2015-09-05 NOTE — Progress Notes (Signed)
Subjective:  Patient seen during dialysis Tolerating well     Objective:  Vital signs in last 24 hours:  Temp:  [98.8 F (37.1 C)-99 F (37.2 C)] 99 F (37.2 C) (10/17 0602) Pulse Rate:  [100-108] 100 (10/17 1345) Resp:  [20-24] 20 (10/17 1345) BP: (78-164)/(25-107) 100/54 mmHg (10/17 1345) SpO2:  [99 %-100 %] 100 % (10/17 1035) FiO2 (%):  [28 %] 28 % (10/17 0030)  Weight change:  Filed Weights   08/30/15 0500 08/30/15 1500 08/31/15 0558  Weight: 87 kg (191 lb 12.8 oz) 87 kg (191 lb 12.8 oz) 84 kg (185 lb 3 oz)    Intake/Output: I/O last 3 completed shifts: In: 1410 [NG/GT:1410] Out: -    Intake/Output this shift:     Physical Exam: General: No acute distress  Head: Normocephalic, atraumatic. +NGT   Eyes: anicteric  Neck: Trach in place  Lungs:  Scattered rhonchi, normal effort  Heart: S1S2 no rubs  Abdomen:  Soft, nontender, BS present  Extremities:  +right upper extremity edema, trace edema of left upper extremity and dependent  Neurologic: Able to nod yes/no to questions  Skin: No apparent rash  Access: R IJ permcath    Basic Metabolic Panel:  Recent Labs Lab 08/30/15 0435 08/31/15 0434 09/02/15 0636 09/04/15 0351 09/05/15 0450  NA 140 140 143 140 141  K 3.3* 3.5 3.4* 4.1 4.4  CL 108 104 106 104 103  CO2 29 31 34* 32 33*  GLUCOSE 120* 134* 139* 89 65  BUN 37* 29* 38* 38* 53*  CREATININE 1.09* 0.84 0.82 0.87 1.25*  CALCIUM 7.5* 7.9* 7.8* 7.7* 7.9*  MG 2.1 1.9  --   --   --   PHOS 3.7 2.8  --   --   --     Liver Function Tests: No results for input(s): AST, ALT, ALKPHOS, BILITOT, PROT, ALBUMIN in the last 168 hours. No results for input(s): LIPASE, AMYLASE in the last 168 hours. No results for input(s): AMMONIA in the last 168 hours.  CBC:  Recent Labs Lab 09/02/15 0636 09/04/15 0351 09/05/15 0450  WBC 12.9* 11.5* 11.1*  HGB 8.5* 7.9* 8.1*  HCT 27.5* 25.1* 25.2*  MCV 96.0 96.4 95.8  PLT 162 183 198    Cardiac Enzymes:  Recent  Labs Lab 08/30/15 0435  CKTOTAL 15*    BNP: Invalid input(s): POCBNP  CBG:  Recent Labs Lab 09/05/15 0027 09/05/15 0116 09/05/15 0137 09/05/15 0749 09/05/15 1126  GLUCAP 57* 67 74 70 74    Microbiology: Results for orders placed or performed during the hospital encounter of 12-Mar-2015  Blood Culture (routine x 2)     Status: None   Collection Time: 12-Mar-2015  8:51 AM  Result Value Ref Range Status   Specimen Description BLOOD Dolores HooseUNKO  Final   Special Requests BOTTLES DRAWN AEROBIC AND ANAEROBIC  3CC  Final   Culture NO GROWTH 5 DAYS  Final   Report Status 08/17/2015 FINAL  Final  Blood Culture (routine x 2)     Status: None   Collection Time: 12-Mar-2015  9:20 AM  Result Value Ref Range Status   Specimen Description BLOOD LEFT ARM  Final   Special Requests BOTTLES DRAWN AEROBIC AND ANAEROBIC  1CC  Final   Culture NO GROWTH 5 DAYS  Final   Report Status 08/17/2015 FINAL  Final  Wound culture     Status: None   Collection Time: 12-Mar-2015  9:20 AM  Result Value Ref Range Status   Specimen  Description DECUBITIS  Final   Special Requests Normal  Final   Gram Stain   Final    FEW WBC SEEN MANY GRAM NEGATIVE RODS RARE GRAM POSITIVE COCCI    Culture   Final    HEAVY GROWTH ESCHERICHIA COLI MODERATE GROWTH ENTEROBACTER AEROGENES PROTEUS MIRABILIS HEAVY GROWTH ENTEROCOCCUS SPECIES VRE HAVE INTRINSIC RESISTANCE TO MOST COMMONLY USED ANTIBIOTICS AND THE ABILITY TO ACQUIRE RESISTANCE TO MOST AVAILABLE ANTIBIOTICS.    Report Status 08/16/2015 FINAL  Final   Organism ID, Bacteria ESCHERICHIA COLI  Final   Organism ID, Bacteria ENTEROBACTER AEROGENES  Final   Organism ID, Bacteria PROTEUS MIRABILIS  Final   Organism ID, Bacteria ENTEROCOCCUS SPECIES  Final      Susceptibility   Enterobacter aerogenes - MIC*    CEFTAZIDIME <=1 SENSITIVE Sensitive     CEFAZOLIN >=64 RESISTANT Resistant     CEFTRIAXONE <=1 SENSITIVE Sensitive     CIPROFLOXACIN <=0.25 SENSITIVE Sensitive      GENTAMICIN <=1 SENSITIVE Sensitive     IMIPENEM 1 SENSITIVE Sensitive     TRIMETH/SULFA <=20 SENSITIVE Sensitive     * MODERATE GROWTH ENTEROBACTER AEROGENES   Escherichia coli - MIC*    AMPICILLIN <=2 SENSITIVE Sensitive     CEFTAZIDIME <=1 SENSITIVE Sensitive     CEFAZOLIN <=4 SENSITIVE Sensitive     CEFTRIAXONE <=1 SENSITIVE Sensitive     CIPROFLOXACIN <=0.25 SENSITIVE Sensitive     GENTAMICIN <=1 SENSITIVE Sensitive     IMIPENEM <=0.25 SENSITIVE Sensitive     TRIMETH/SULFA <=20 SENSITIVE Sensitive     * HEAVY GROWTH ESCHERICHIA COLI   Proteus mirabilis - MIC*    AMPICILLIN >=32 RESISTANT Resistant     CEFTAZIDIME <=1 SENSITIVE Sensitive     CEFAZOLIN 8 SENSITIVE Sensitive     CEFTRIAXONE <=1 SENSITIVE Sensitive     CIPROFLOXACIN <=0.25 SENSITIVE Sensitive     GENTAMICIN <=1 SENSITIVE Sensitive     IMIPENEM 1 SENSITIVE Sensitive     TRIMETH/SULFA <=20 SENSITIVE Sensitive     * PROTEUS MIRABILIS   Enterococcus species - MIC*    AMPICILLIN >=32 RESISTANT Resistant     VANCOMYCIN >=32 RESISTANT Resistant     GENTAMICIN SYNERGY SENSITIVE Sensitive     TETRACYCLINE Value in next row Resistant      RESISTANT>=16    * HEAVY GROWTH ENTEROCOCCUS SPECIES  MRSA PCR Screening     Status: None   Collection Time: 08/11/2015  2:38 PM  Result Value Ref Range Status   MRSA by PCR NEGATIVE NEGATIVE Final    Comment:        The GeneXpert MRSA Assay (FDA approved for NASAL specimens only), is one component of a comprehensive MRSA colonization surveillance program. It is not intended to diagnose MRSA infection nor to guide or monitor treatment for MRSA infections.   Blood culture (single)     Status: None   Collection Time: 08/16/2015  3:36 PM  Result Value Ref Range Status   Specimen Description BLOOD RIGHT ASSIST CONTROL  Final   Special Requests BOTTLES DRAWN AEROBIC AND ANAEROBIC  Final   Culture NO GROWTH 5 DAYS  Final   Report Status 08/17/2015 FINAL  Final  Wound culture      Status: None   Collection Time: 08/13/15 12:37 PM  Result Value Ref Range Status   Specimen Description WOUND  Final   Special Requests Normal  Final   Gram Stain   Final    FEW WBC SEEN TOO NUMEROUS  TO COUNT GRAM NEGATIVE RODS FEW GRAM POSITIVE COCCI    Culture   Final    HEAVY GROWTH ESCHERICHIA COLI MODERATE GROWTH PROTEUS MIRABILIS LIGHT GROWTH KLEBSIELLA PNEUMONIAE MODERATE GROWTH ENTEROCOCCUS GALLINARUM CRITICAL RESULT CALLED TO, READ BACK BY AND VERIFIED WITH: [facility] BORBA AT 1042 08/16/15 DV    Report Status 08/17/2015 FINAL  Final   Organism ID, Bacteria ESCHERICHIA COLI  Final   Organism ID, Bacteria PROTEUS MIRABILIS  Final   Organism ID, Bacteria KLEBSIELLA PNEUMONIAE  Final   Organism ID, Bacteria ENTEROCOCCUS GALLINARUM  Final      Susceptibility   Escherichia coli - MIC*    AMPICILLIN >=32 RESISTANT Resistant     CEFTAZIDIME 4 RESISTANT Resistant     CEFAZOLIN >=64 RESISTANT Resistant     CEFTRIAXONE 16 RESISTANT Resistant     GENTAMICIN 2 SENSITIVE Sensitive     IMIPENEM >=16 RESISTANT Resistant     TRIMETH/SULFA <=20 SENSITIVE Sensitive     Extended ESBL POSITIVE Resistant     PIP/TAZO Value in next row Resistant      RESISTANT>=128    CIPROFLOXACIN Value in next row Sensitive      SENSITIVE<=0.25    * HEAVY GROWTH ESCHERICHIA COLI   Klebsiella pneumoniae - MIC*    AMPICILLIN Value in next row Resistant      SENSITIVE<=0.25    CEFTAZIDIME Value in next row Resistant      SENSITIVE<=0.25    CEFAZOLIN Value in next row Resistant      SENSITIVE<=0.25    CEFTRIAXONE Value in next row Resistant      SENSITIVE<=0.25    CIPROFLOXACIN Value in next row Resistant      SENSITIVE<=0.25    GENTAMICIN Value in next row Sensitive      SENSITIVE<=0.25    IMIPENEM Value in next row Resistant      SENSITIVE<=0.25    TRIMETH/SULFA Value in next row Resistant      SENSITIVE<=0.25    PIP/TAZO Value in next row Resistant      RESISTANT>=128    * LIGHT GROWTH  KLEBSIELLA PNEUMONIAE   Proteus mirabilis - MIC*    AMPICILLIN Value in next row Resistant      RESISTANT>=128    CEFTAZIDIME Value in next row Sensitive      RESISTANT>=128    CEFAZOLIN Value in next row Sensitive      RESISTANT>=128    CEFTRIAXONE Value in next row Sensitive      RESISTANT>=128    CIPROFLOXACIN Value in next row Sensitive      RESISTANT>=128    GENTAMICIN Value in next row Sensitive      RESISTANT>=128    IMIPENEM Value in next row Sensitive      RESISTANT>=128    TRIMETH/SULFA Value in next row Sensitive      RESISTANT>=128    PIP/TAZO Value in next row Sensitive      SENSITIVE<=4    * MODERATE GROWTH PROTEUS MIRABILIS   Enterococcus gallinarum - MIC*    AMPICILLIN Value in next row Resistant      SENSITIVE<=4    GENTAMICIN SYNERGY Value in next row Sensitive      SENSITIVE<=4    CIPROFLOXACIN Value in next row Resistant      RESISTANT>=8    TETRACYCLINE Value in next row Resistant      RESISTANT>=16    * MODERATE GROWTH ENTEROCOCCUS GALLINARUM  Wound culture     Status: None   Collection Time: 08/15/15  2:53  PM  Result Value Ref Range Status   Specimen Description WOUND  Final   Special Requests NONE  Final   Gram Stain FEW WBC SEEN NO ORGANISMS SEEN   Final   Culture NO GROWTH 3 DAYS  Final   Report Status 08/18/2015 FINAL  Final  C difficile quick scan w PCR reflex     Status: None   Collection Time: 08/17/15 11:34 AM  Result Value Ref Range Status   C Diff antigen NEGATIVE NEGATIVE Final   C Diff toxin NEGATIVE NEGATIVE Final   C Diff interpretation Negative for C. difficile  Final  Stool culture     Status: None (Preliminary result)   Collection Time: 09/03/15  3:51 PM  Result Value Ref Range Status   Specimen Description STOOL  Final   Special Requests Immunocompromised  Final   Culture   Final    HOLDING FOR POSSIBLE PATHOGEN No Pathogenic E. coli detected NO CAMPYLOBACTER DETECTED    Report Status PENDING  Incomplete     Coagulation Studies: No results for input(s): LABPROT, INR in the last 72 hours.  Urinalysis: No results for input(s): COLORURINE, LABSPEC, PHURINE, GLUCOSEU, HGBUR, BILIRUBINUR, KETONESUR, PROTEINUR, UROBILINOGEN, NITRITE, LEUKOCYTESUR in the last 72 hours.  Invalid input(s): APPERANCEUR    Imaging: No results found.   Medications:     . acidophilus  1 capsule Oral Daily  . amantadine  200 mg Oral Q7 days  . antiseptic oral rinse  7 mL Mouth Rinse q12n4p  . chlorhexidine  15 mL Mouth Rinse BID  . epoetin (EPOGEN/PROCRIT) injection  10,000 Units Intravenous Q T,Th,Sa-HD  . feeding supplement (JEVITY 1.2 CAL)  1,000 mL Per Tube Q24H  . feeding supplement (PRO-STAT SUGAR FREE 64)  30 mL Oral 6 times per day  . free water  200 mL Per Tube 3 times per day  . heparin  1,000 Units Intracatheter Once  . heparin subcutaneous  5,000 Units Subcutaneous Q12H  . hydrocortisone  10 mg Per Tube BID  . lidocaine  1 patch Transdermal Q24H  . midodrine  10 mg Oral TID WC  . multivitamin  5 mL Per Tube Daily  . nystatin   Topical TID  . pantoprazole sodium  40 mg Per Tube Q1200  . sodium chloride  10-40 mL Intracatheter Q12H  . sodium hypochlorite   Irrigation BID   sodium chloride, sodium chloride, sodium chloride, sodium chloride, acetaminophen (TYLENOL) oral liquid 160 mg/5 mL, alteplase, alteplase, anticoagulant sodium citrate, ipratropium-albuterol, lidocaine (PF), lidocaine-prilocaine, loperamide, oxyCODONE-acetaminophen, pentafluoroprop-tetrafluoroeth, sodium chloride  Assessment/ Plan:  49 y.o. 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  1. End-stage renal disease on hemodialysis on HD MWF. The patient has been on dialysis since October of 2014. R IJ permcath.  - Patient seen during dialysis Tolerating well  - given iv albumin for oncotic support  2. Anemia of CKD: status post PRBC transfusions. Hgb down to 8.1 - continue epogen IV with HD.   3. Secondary hyperparathyroidism: PTH low at 55, phos also low. - no indication for binders or activated vitamin D at this point  in time given low phos and PTH.  4. Hypotension: required vasopressors earlier in admission. - continue midodrine  po tid.  We have also been giving PRN albumin to keep BP up during HD, but despite this UF has been limited by hypotension.  5.  Dispo:  Unclear at this time. Has to be able to sit in chair for the duration of treatment prior to outpatient discharge Otherwise may need to look into stretcher dialysis facilities   LOS: 24 Adyline Huberty 10/17/20162:02 PM

## 2015-09-05 NOTE — Progress Notes (Signed)
Pt's CBG was 57 at 0027, pt was asymptomatic, administered Pro-stat 30 ml as scheduled. CBG was 67 at 0116 and 74 at 0137. Will continue to monitor.

## 2015-09-06 ENCOUNTER — Inpatient Hospital Stay: Payer: 59

## 2015-09-06 DIAGNOSIS — E43 Unspecified severe protein-calorie malnutrition: Secondary | ICD-10-CM

## 2015-09-06 LAB — GLUCOSE, CAPILLARY
GLUCOSE-CAPILLARY: 84 mg/dL (ref 65–99)
GLUCOSE-CAPILLARY: 87 mg/dL (ref 65–99)
GLUCOSE-CAPILLARY: 103 mg/dL — AB (ref 65–99)
Glucose-Capillary: 72 mg/dL (ref 65–99)

## 2015-09-06 MED ORDER — MEGESTROL ACETATE 40 MG/ML PO SUSP
200.0000 mg | Freq: Every day | ORAL | Status: DC
  Administered 2015-09-06 – 2015-09-08 (×3): 200 mg
  Filled 2015-09-06 (×3): qty 5

## 2015-09-06 MED ORDER — EPOETIN ALFA 10000 UNIT/ML IJ SOLN
10000.0000 [IU] | Freq: Once | INTRAMUSCULAR | Status: AC
  Administered 2015-09-07: 10000 [IU] via SUBCUTANEOUS

## 2015-09-06 NOTE — Progress Notes (Signed)
[facility] Critical Care Medicine Progess Note  Name: Courtney [name] MRN: [id number] DOB: 12/03/1965    ADMISSION DATE:  2015-09-10 ADMISSION DATE: 09-10-2015  INTERVAL HISTORY:  Today- 10/18 discussed with husband-patient passed swallow eval spoke with Speech Therapist Will add Pureed thick nectar foods and continue Enteral feeds through NG tube.  Will add Megace   On 10/16  the patient's husband was upset due to placement of a rectal tube by on-call physician, this was removed. It was also noted that the pt developed diarrhea, therefore Jevity was changed from 1.5 with fiber to Jevity 1.2; the rate was decreased to 20 cc/hr and the pt was given a dose of lomotil, which has helped the diarrhea. It was also sent for stool culture and Cdif.   On 10/17 discussed with husband options going forward. He would like to transfer to [facility] as it is closer to him, also  [facility] is there so he can have his wife evaluated better and that he can have the [employer] come and check on her status personally-I have discussed case with Hospitalist and they pretty much have refused to accept her at this time.   -He declines transferof his wife back to [place].   -He would like to repeat speech evaluation and Pasey-Muir valve as he hopes that his wife will be able to eat and then will not need a feeding tube. He understands that she may be "stuck" in the hospital as currently no local LTAC is accepting her. He is continuing discussions with [facility].   -He would like his wife restarted on aspirin due to stroke risk. It was explained that given her life threatening bleed I did not feel comfortable restarting this medication at this time, but he could revisit this discussion with other physicians in the future.    INITIAL PRESENTATION:  47 F who has been in medical facilities (hosp, LTAC, rehab) for 2 yrs following gastric bypass surgery with multiple complications. Now with chronic trach, ESRD, profound  debilitation, severe sacral pressure ulcer. Was seemingly making progress and transferred to rehab facility approx one week prior to this admission. She was sent to [facility] ED with AMS and hypotension. Working dx of severe sepsis/septic shock due to infected sacral pressure ulcer. Also has R side externalized HD cath and tunneled L IJ CVL. She has history of resistant bacterial infections. She is on chronic steroids. Her husband indicates that she has been diagnosed with adrenal insuff at some point during the past 2 yrs  MAJOR EVENTS/TEST RESULTS:  Admission 02/07/14-05/07/14 Admission 07/21/14-09/06/14 Discharged to [facility]. Pt had palliative consult at that time, were asked to sign off by husband.  09/23 CT head: NAD 09/23 EEG: no epileptiform activity 09/23 PRBCs for Hgb 6.4 09/24 bedside debridement of sacral wound. Abscess drained 09/25 Off vasopressors. More alert. No distress. Worsening thrombocytopenia. Vanc DC'd 09/29  Dr. Sampson Goon (I.D) excused from the case by patient's husband. 10/02 MRI -multiple infarcts 10/03 tracheal bleeding- transfused platelets 10/03 hospitalist service excused from the case by patient's husband 10/03 Echocardiogram ejection fraction was 55-60%, pulmonary systolic pressure was 39 mmHg 78/29 restart TF's at lower rate, attempt reg HD  10/12 Transferred to med-surg floor. 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 History of sepsis from C. glabrata MRSA PCR 9/23 >> NEG Urine 9/23 >>  Wound (swab) 9/23 >> multiple  organisms Wound (debridement) 9/24 >> Enterococcus, K. Pneumoniae, P. Mirabilis, VRE Wound 9/26 >> No growth Blood 9/23 >> neg CDiff 9/27>>neg  ANTIMICROBIALS:  Aztreonam 9/23 >> 9/24 Vanc 9/23 >> 9/25 Vanc 9/26>>9/27 Daptomycin 9/27>> 10/12 Meropenem 9/23 >> 10/14   SUBJECTIVE:  RASS 0. Nonverbal, does not follow commands, opens eyes to vocal stimuli   VITAL SIGNS: Temp:  [99.5 F (37.5 C)-100.3 F (37.9 C)] 99.5 F (37.5 C) (10/18 0546) Pulse Rate:  [100-117] 117 (10/18 0546) Resp:  [20-26] 26 (10/17 2120) BP: (78-164)/(25-107) 128/58 mmHg (10/18 0546) SpO2:  [100 %] 100 % (10/18 0546) FiO2 (%):  [28 %] 28 % (10/17 1940) HEMODYNAMICS:   VENTILATOR SETTINGS: Vent Mode:  [-]  FiO2 (%):  [28 %] 28 % INTAKE / OUTPUT:  Intake/Output Summary (Last 24 hours) at 09/06/15 1058 Last data filed at 09/05/15 1800  Gross per 24 hour  Intake    520 ml  Output    279 ml  Net    241 ml    PHYSICAL EXAMINATION: Physical Examination:   VS: BP 128/58 mmHg  Pulse 117  Temp(Src) 99.5 F (37.5 C) (Oral)  Resp 26  Ht 5\' 9"  (1.753 m)  Wt 185 lb 3 oz (84 kg)  BMI 27.33 kg/m2  SpO2 100%  General Appearance: No resp distress  Neuro: profoundly diffusely weak HEENT: cushingoid facies, PERRLA, EOM intact Neck: trach site clean Pulmonary: clear anteriorly Cardiovascular: reg, no M  Abdomen: soft, NT, +BS Extremities: warm, R>L UE edema    LABORATORY PANEL:   CBC  Recent Labs Lab 09/05/15 0450  WBC 11.1*  HGB 8.1*  HCT 25.2*  PLT 198    Chemistries   Recent Labs Lab 08/31/15 0434  09/05/15 0450  NA 140  < > 141  K 3.5  < > 4.4  CL 104  < > 103  CO2 31  < > 33*  GLUCOSE 134*  < > 65  BUN 29*  < > 53*  CREATININE 0.84  < > 1.25*  CALCIUM 7.9*  < > 7.9*  MG 1.9  --   --   PHOS 2.8  --   --   < > = values in this interval not displayed.   Recent Labs Lab 09/05/15 0749 09/05/15 1126 09/05/15 1643 09/05/15 1746 09/06/15 0007 09/06/15 0739  GLUCAP  70 74 58* 74 72 84   No results for input(s): PHART, PCO2ART, PO2ART in the last 168 hours. No results for input(s): AST, ALT, ALKPHOS, BILITOT, ALBUMIN in the last 168 hours.  Cardiac Enzymes No results for input(s): TROPONINI in the last 168 hours.  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 Diff Stool incontinence Dysphagia P:  Cont PPI Avoid anticoagulants Plan for PM valve daily for  several hours at a time -plan for capping in next 2-3 days  HEMATOLOGIC A:  Acute on chronic anemia Thrombocytopenia - HIT panel negative 9/25 P:  DVT px: SCDs, resume SQ heparin 10/14 Monitor CBC intermittently Transfuse per usual guidelines plt count 198 10/17  INFECTIOUS A:  Severe sepsis, resolved Sacral decubitus ulcer with abscess P:  Monitor temp, WBC count Micro and abx as above-off abx now  ENDOCRINE A:  DM 2, controlled. 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 Profound deconditioning Chronic pain P: RASS goal: 0 Minimize sedating meds Changed analgesics to PRN oxycodone 10/13 Cont PT  Per Neurology, no indication for TEE presently as she would not be a candidate for anticoagulation  SOCIAL/DISPO: [name] has been somewhat  difficult and has fired ID and Hospitalist services We have sought transfer to [facility] but she has been turned down by [facility] and [facility] We are seeking return to [facility] but she cannot go there with NGT and husband has been reluctant to consent to G-tube placement Care management very involved  I have personally obtained a history, examined the patient, evaluated Pertinent laboratory and RadioGraphic/imaging results, and  formulated the assessment and plan   The Patient requires high complexity decision making for assessment and support, frequent evaluation and titration of therapies.  Husband satisfied with Plan of action and management. All questions answered  Lucie Leather, M.D.  [facility]  Medical Director [facility] Medical Director [facility] Cardio-Pulmonary Department

## 2015-09-06 NOTE — Progress Notes (Signed)
PT Cancellation Note  Patient Details Name: Courtney [name] MRN: [id number] DOB: [birth year]   Cancelled Treatment:    Reason Eval/Treat Not Completed: Patient declined, no reason specified (Treatment re-attempted x3 today.  At this attempt, patient sleeping upon arrival, but arousable to voice.  Consistently shaking head "no" to therapy attempts, pulling L arm from therapist and placing back under covers.  Persistently refusing attempts at all therapy interventions; unable to redirect.  Will continue efforts as patient available and agreeable.)   Courtney [name], PT, DPT, NCS 09/06/2015, 2:22 PM [phone]

## 2015-09-06 NOTE — Progress Notes (Addendum)
Nutrition Follow-up  DOCUMENTATION CODES:   Severe malnutrition in context of acute illness/injury  INTERVENTION:  Coordination of care: Spoke with SLP and Dr . Belia HemanKasa.  Per MD Kasa wanting to continue enteral nutrition of jevity 1.2 via dobhoff at 3820ml/hr and prostat times 6 for added nutrition. SLP starting on puree with nectar thick liquids at this time Medical Nutrition Supplement Therapy: Will add SF mightyshake (appropriate on nectar thick diet) TID for added nutrition (lower in carbohydrate and higher in protein to decrease risk of additional diarrhea) and yogurt to trays. Husband has previously reported pt likes yogurt.    Coordination of care: Will ask nursing for new weight at this time.   NUTRITION DIAGNOSIS:   Inadequate oral intake related to wound healing, acute illness, chronic illness as evidenced by NPO status, estimated needs.    GOAL:   Patient will meet greater than or equal to 90% of their needs    MONITOR:    (Energy Intake, Anthropometrics, Digestive System, Electrolyte/Renal Profile, Glucose Profile)  REASON FOR ASSESSMENT:   Consult Enteral/tube feeding initiation and management  ASSESSMENT:    Pt passed swallow evaluation today.   Current Nutrition: pt sleeping during visit this pm.  Spoke with RN Elnita Maxwellheryl.      Scheduled Medications:  . acidophilus  1 capsule Oral Daily  . amantadine  200 mg Oral Q7 days  . antiseptic oral rinse  7 mL Mouth Rinse q12n4p  . chlorhexidine  15 mL Mouth Rinse BID  . epoetin (EPOGEN/PROCRIT) injection  10,000 Units Intravenous Q T,Th,Sa-HD  . [START ON 09/07/2015] epoetin (EPOGEN/PROCRIT) injection  10,000 Units Subcutaneous Once  . feeding supplement (JEVITY 1.2 CAL)  1,000 mL Per Tube Q24H  . feeding supplement (PRO-STAT SUGAR FREE 64)  30 mL Oral 6 times per day  . free water  200 mL Per Tube 3 times per day  . heparin subcutaneous  5,000 Units Subcutaneous Q12H  . hydrocortisone  10 mg Per Tube BID  .  lidocaine  1 patch Transdermal Q24H  . megestrol  200 mg Per Tube Daily  . midodrine  10 mg Oral TID WC  . multivitamin  5 mL Per Tube Daily  . nystatin   Topical TID  . pantoprazole sodium  40 mg Per Tube Q1200  . sodium chloride  10-40 mL Intracatheter Q12H  . sodium hypochlorite   Irrigation BID    Megace added today     Electrolyte/Renal Profile and Glucose Profile:   Recent Labs Lab 08/31/15 0434 09/02/15 0636 09/04/15 0351 09/05/15 0450  NA 140 143 140 141  K 3.5 3.4* 4.1 4.4  CL 104 106 104 103  CO2 31 34* 32 33*  BUN 29* 38* 38* 53*  CREATININE 0.84 0.82 0.87 1.25*  CALCIUM 7.9* 7.8* 7.7* 7.9*  MG 1.9  --   --   --   PHOS 2.8  --   --   --   GLUCOSE 134* 139* 89 65    Gastrointestinal Profile: Last VW:[id number]:loose BM continues   Diet Order:  DIET - DYS 1 Room service appropriate?: Yes with Assist; Fluid consistency:: Nectar Thick  Skin:   (stage IV sacrum, stage III hip, unstageable foot; rectal ulcer from fleixseal, ulcer in nose from dobhoff)   Height:   Ht Readings from Last 1 Encounters:  05-Jul-2015 5\' 9"  (1.753 m)    Weight:   Wt Readings from Last 1 Encounters:  08/31/15 185 lb 3 oz (84 kg)  BMI:  Body mass index is 27.33 kg/(m^2).  Estimated Nutritional Needs:   Kcal:  4098-1191 kcals (BEE 1434, 1.2 AF, 1.2-1.4 IF)   Protein:  113-150 g/kg (1.5-2.0 g/kg) but may be closer to 150-188 g (2.0-2.5 g/kg)   Fluid:  1000 mL plus UOP  EDUCATION NEEDS:   No education needs identified at this time  HIGH Care Level  Davis Vannatter B. Freida Busman, RD, LDN [phone] (pager)

## 2015-09-06 NOTE — Care Management (Signed)
Discussed case in long length of stay.  Determined to set up multidisciplinary team meeting with husband to discuss goals of care.  Called and spoke with Mr. Archie PattenMonroe at length his concerns.  Mr. Archie PattenMonroe stated that he spoke with Dr. Belia HemanKasa earlier today, and informed MD not to remove NG tube even though patient passed swallowing study.  Key point from conversation: - Mr. Archie PattenMonroe refuses for NG tube to be pulled.  States that patient progresses differently then other people do and she will need additional time.  He feels that hospital is pushing placing a PEG which he feels is an unnecessary procedure. Proven by the fact "that she was able to pass her swallow test".  I reinforced the fact that patient would not be able to be discharged back to [facility] with an NG in. Mr. Archie PattenMonroe states that "you guys are going to just be stuck with her for a while" -Mr Archie PattenMonroe feels that it is to late to hold a group meeting, however he is agreeable.  Mr. Archie PattenMonroe prefers that the meeting be held via speaker phone.  States that if it has to be in person it would have to be during the week after 6pm, or on a Saturday.  Mr Archie PattenMonroe states that he feels everything can be accomplished over the phone.  States "not sure why it needs to be face to face, unless you all need to read my facial expression but I don't play those games".  Mr [facility] request that Dr. Randa LynnLamb, Dr. Belia HemanKasa, SW, nephrology, neurology, speech, nutrition, OT, PT, and case management be present.   I explained to him that someone from each discipline may not be available however I would discusses with the team to facilitate the key people.   - I directly asked Mr. Archie PattenMonroe what were his goals in order for the patient to progress.  Mr. Archie PattenMonroe states that prior to being discharge he wants to know why the stroke occurred? Why her platelet counts dropped, and nutritional status to be improved (he states that she will be able to eat on her on again) - Message left for Dr. Randa LynnLamb to move forward  with plan

## 2015-09-06 NOTE — Progress Notes (Signed)
RT to patient room at 0930 to assist with transport to radiology for modified barium swallow.  RT placed pt on 28% venturi setup placed onto trach for transport.  Patient tolerated transport and procedure, patient returned to room at approximately 1030 and placed back on 28% trach collar.

## 2015-09-06 NOTE — Progress Notes (Signed)
PT Cancellation Note  Patient Details Name: Courtney [name] MRN: [id number] DOB: 1966/10/21   Cancelled Treatment:    Reason Eval/Treat Not Completed: Patient at procedure or test/unavailable (Treatment session re-attempted.  [facility] but currently with nursing for medication and dressing change.  Scheduled for family meeting this PM.  Will continue efforts as patient available.)   Allaina Brotzman H. Manson PasseyBrown, PT, DPT, NCS 09/06/2015, 11:28 AM [phone]

## 2015-09-06 NOTE — Progress Notes (Signed)
PT Cancellation Note  Patient Details Name: Lisette GrinderHedda McMillian Monroe MRN: [id number] DOB: [birth year]   Cancelled Treatment:    Reason Eval/Treat Not Completed: Patient at procedure or test/unavailable (Treatment session attempted; patient currently off unit for MBSS.   Will re-attempt at later time/date as patient available and medically appropriate.)  Jencarlos Nicolson H. Manson PasseyBrown, PT, DPT, NCS 09/06/2015, 9:52 AM [phone]

## 2015-09-06 NOTE — Progress Notes (Signed)
Subjective:  Patient to undergo speech eval/ swallowing eval today No acute events    Objective:  Vital signs in last 24 hours:  Temp:  [99.4 F (37.4 C)-100.3 F (37.9 C)] 99.4 F (37.4 C) (10/18 1112) Pulse Rate:  [100-117] 114 (10/18 1112) Resp:  [16-26] 16 (10/18 1112) BP: (78-128)/(25-96) 115/96 mmHg (10/18 1112) SpO2:  [95 %-100 %] 95 % (10/18 1112) FiO2 (%):  [28 %] 28 % (10/17 1940)  Weight change:  Filed Weights   08/30/15 0500 08/30/15 1500 08/31/15 0558  Weight: 87 kg (191 lb 12.8 oz) 87 kg (191 lb 12.8 oz) 84 kg (185 lb 3 oz)    Intake/Output: I/O last 3 completed shifts: In: 520 [NG/GT:520] Out: 279 [Other:279]   Intake/Output this shift:     Physical Exam: General: No acute distress  Head: Normocephalic, atraumatic. +NGT   Eyes: anicteric  Neck: Trach in place  Lungs:  Scattered rhonchi, normal effort  Heart: S1S2 no rubs  Abdomen:  Soft, nontender, BS present  Extremities:  +right upper extremity edema, trace edema of left upper extremity and dependent  Neurologic: Able to nod yes/no to questions  Skin: No acute rash  Access: R IJ permcath    Basic Metabolic Panel:  Recent Labs Lab 08/31/15 0434 09/02/15 0636 09/04/15 0351 09/05/15 0450  NA 140 143 140 141  K 3.5 3.4* 4.1 4.4  CL 104 106 104 103  CO2 31 34* 32 33*  GLUCOSE 134* 139* 89 65  BUN 29* 38* 38* 53*  CREATININE 0.84 0.82 0.87 1.25*  CALCIUM 7.9* 7.8* 7.7* 7.9*  MG 1.9  --   --   --   PHOS 2.8  --   --   --     Liver Function Tests: No results for input(s): AST, ALT, ALKPHOS, BILITOT, PROT, ALBUMIN in the last 168 hours. No results for input(s): LIPASE, AMYLASE in the last 168 hours. No results for input(s): AMMONIA in the last 168 hours.  CBC:  Recent Labs Lab 09/02/15 0636 09/04/15 0351 09/05/15 0450  WBC 12.9* 11.5* 11.1*  HGB 8.5* 7.9* 8.1*  HCT 27.5* 25.1* 25.2*  MCV 96.0 96.4 95.8  PLT 162 183 198    Cardiac Enzymes: No results for input(s): CKTOTAL,  CKMB, CKMBINDEX, TROPONINI in the last 168 hours.  BNP: Invalid input(s): POCBNP  CBG:  Recent Labs Lab 09/05/15 1643 09/05/15 1746 09/06/15 0007 09/06/15 0739 09/06/15 1112  GLUCAP 58* 74 72 84 87    Microbiology: Results for orders placed or performed during the hospital encounter of 08/11/2015  Blood Culture (routine x 2)     Status: None   Collection Time: 07/27/2015  8:51 AM  Result Value Ref Range Status   Specimen Description BLOOD Dolores HooseUNKO  Final   Special Requests BOTTLES DRAWN AEROBIC AND ANAEROBIC  3CC  Final   Culture NO GROWTH 5 DAYS  Final   Report Status 08/17/2015 FINAL  Final  Blood Culture (routine x 2)     Status: None   Collection Time: 08/07/2015  9:20 AM  Result Value Ref Range Status   Specimen Description BLOOD LEFT ARM  Final   Special Requests BOTTLES DRAWN AEROBIC AND ANAEROBIC  1CC  Final   Culture NO GROWTH 5 DAYS  Final   Report Status 08/17/2015 FINAL  Final  Wound culture     Status: None   Collection Time: 07/22/2015  9:20 AM  Result Value Ref Range Status   Specimen Description DECUBITIS  Final  Special Requests Normal  Final   Gram Stain   Final    FEW WBC SEEN MANY GRAM NEGATIVE RODS RARE GRAM POSITIVE COCCI    Culture   Final    HEAVY GROWTH ESCHERICHIA COLI MODERATE GROWTH ENTEROBACTER AEROGENES PROTEUS MIRABILIS HEAVY GROWTH ENTEROCOCCUS SPECIES VRE HAVE INTRINSIC RESISTANCE TO MOST COMMONLY USED ANTIBIOTICS AND THE ABILITY TO ACQUIRE RESISTANCE TO MOST AVAILABLE ANTIBIOTICS.    Report Status 08/16/2015 FINAL  Final   Organism ID, Bacteria ESCHERICHIA COLI  Final   Organism ID, Bacteria ENTEROBACTER AEROGENES  Final   Organism ID, Bacteria PROTEUS MIRABILIS  Final   Organism ID, Bacteria ENTEROCOCCUS SPECIES  Final      Susceptibility   Enterobacter aerogenes - MIC*    CEFTAZIDIME <=1 SENSITIVE Sensitive     CEFAZOLIN >=64 RESISTANT Resistant     CEFTRIAXONE <=1 SENSITIVE Sensitive     CIPROFLOXACIN <=0.25 SENSITIVE Sensitive      GENTAMICIN <=1 SENSITIVE Sensitive     IMIPENEM 1 SENSITIVE Sensitive     TRIMETH/SULFA <=20 SENSITIVE Sensitive     * MODERATE GROWTH ENTEROBACTER AEROGENES   Escherichia coli - MIC*    AMPICILLIN <=2 SENSITIVE Sensitive     CEFTAZIDIME <=1 SENSITIVE Sensitive     CEFAZOLIN <=4 SENSITIVE Sensitive     CEFTRIAXONE <=1 SENSITIVE Sensitive     CIPROFLOXACIN <=0.25 SENSITIVE Sensitive     GENTAMICIN <=1 SENSITIVE Sensitive     IMIPENEM <=0.25 SENSITIVE Sensitive     TRIMETH/SULFA <=20 SENSITIVE Sensitive     * HEAVY GROWTH ESCHERICHIA COLI   Proteus mirabilis - MIC*    AMPICILLIN >=32 RESISTANT Resistant     CEFTAZIDIME <=1 SENSITIVE Sensitive     CEFAZOLIN 8 SENSITIVE Sensitive     CEFTRIAXONE <=1 SENSITIVE Sensitive     CIPROFLOXACIN <=0.25 SENSITIVE Sensitive     GENTAMICIN <=1 SENSITIVE Sensitive     IMIPENEM 1 SENSITIVE Sensitive     TRIMETH/SULFA <=20 SENSITIVE Sensitive     * PROTEUS MIRABILIS   Enterococcus species - MIC*    AMPICILLIN >=32 RESISTANT Resistant     VANCOMYCIN >=32 RESISTANT Resistant     GENTAMICIN SYNERGY SENSITIVE Sensitive     TETRACYCLINE Value in next row Resistant      RESISTANT>=16    * HEAVY GROWTH ENTEROCOCCUS SPECIES  MRSA PCR Screening     Status: None   Collection Time: 07/22/2015  2:38 PM  Result Value Ref Range Status   MRSA by PCR NEGATIVE NEGATIVE Final    Comment:        The GeneXpert MRSA Assay (FDA approved for NASAL specimens only), is one component of a comprehensive MRSA colonization surveillance program. It is not intended to diagnose MRSA infection nor to guide or monitor treatment for MRSA infections.   Blood culture (single)     Status: None   Collection Time: 08/06/2015  3:36 PM  Result Value Ref Range Status   Specimen Description BLOOD RIGHT ASSIST CONTROL  Final   Special Requests BOTTLES DRAWN AEROBIC AND ANAEROBIC  Final   Culture NO GROWTH 5 DAYS  Final   Report Status 08/17/2015 FINAL  Final  Wound culture      Status: None   Collection Time: 08/13/15 12:37 PM  Result Value Ref Range Status   Specimen Description WOUND  Final   Special Requests Normal  Final   Gram Stain   Final    FEW WBC SEEN TOO NUMEROUS TO COUNT GRAM NEGATIVE RODS FEW  GRAM POSITIVE COCCI    Culture   Final    HEAVY GROWTH ESCHERICHIA COLI MODERATE GROWTH PROTEUS MIRABILIS LIGHT GROWTH KLEBSIELLA PNEUMONIAE MODERATE GROWTH ENTEROCOCCUS GALLINARUM CRITICAL RESULT CALLED TO, READ BACK BY AND VERIFIED WITH: [facility] BORBA AT 1042 08/16/15 DV    Report Status 08/17/2015 FINAL  Final   Organism ID, Bacteria ESCHERICHIA COLI  Final   Organism ID, Bacteria PROTEUS MIRABILIS  Final   Organism ID, Bacteria KLEBSIELLA PNEUMONIAE  Final   Organism ID, Bacteria ENTEROCOCCUS GALLINARUM  Final      Susceptibility   Escherichia coli - MIC*    AMPICILLIN >=32 RESISTANT Resistant     CEFTAZIDIME 4 RESISTANT Resistant     CEFAZOLIN >=64 RESISTANT Resistant     CEFTRIAXONE 16 RESISTANT Resistant     GENTAMICIN 2 SENSITIVE Sensitive     IMIPENEM >=16 RESISTANT Resistant     TRIMETH/SULFA <=20 SENSITIVE Sensitive     Extended ESBL POSITIVE Resistant     PIP/TAZO Value in next row Resistant      RESISTANT>=128    CIPROFLOXACIN Value in next row Sensitive      SENSITIVE<=0.25    * HEAVY GROWTH ESCHERICHIA COLI   Klebsiella pneumoniae - MIC*    AMPICILLIN Value in next row Resistant      SENSITIVE<=0.25    CEFTAZIDIME Value in next row Resistant      SENSITIVE<=0.25    CEFAZOLIN Value in next row Resistant      SENSITIVE<=0.25    CEFTRIAXONE Value in next row Resistant      SENSITIVE<=0.25    CIPROFLOXACIN Value in next row Resistant      SENSITIVE<=0.25    GENTAMICIN Value in next row Sensitive      SENSITIVE<=0.25    IMIPENEM Value in next row Resistant      SENSITIVE<=0.25    TRIMETH/SULFA Value in next row Resistant      SENSITIVE<=0.25    PIP/TAZO Value in next row Resistant      RESISTANT>=128    * LIGHT GROWTH  KLEBSIELLA PNEUMONIAE   Proteus mirabilis - MIC*    AMPICILLIN Value in next row Resistant      RESISTANT>=128    CEFTAZIDIME Value in next row Sensitive      RESISTANT>=128    CEFAZOLIN Value in next row Sensitive      RESISTANT>=128    CEFTRIAXONE Value in next row Sensitive      RESISTANT>=128    CIPROFLOXACIN Value in next row Sensitive      RESISTANT>=128    GENTAMICIN Value in next row Sensitive      RESISTANT>=128    IMIPENEM Value in next row Sensitive      RESISTANT>=128    TRIMETH/SULFA Value in next row Sensitive      RESISTANT>=128    PIP/TAZO Value in next row Sensitive      SENSITIVE<=4    * MODERATE GROWTH PROTEUS MIRABILIS   Enterococcus gallinarum - MIC*    AMPICILLIN Value in next row Resistant      SENSITIVE<=4    GENTAMICIN SYNERGY Value in next row Sensitive      SENSITIVE<=4    CIPROFLOXACIN Value in next row Resistant      RESISTANT>=8    TETRACYCLINE Value in next row Resistant      RESISTANT>=16    * MODERATE GROWTH ENTEROCOCCUS GALLINARUM  Wound culture     Status: None   Collection Time: 08/15/15  2:53 PM  Result Value Ref Range  Status   Specimen Description WOUND  Final   Special Requests NONE  Final   Gram Stain FEW WBC SEEN NO ORGANISMS SEEN   Final   Culture NO GROWTH 3 DAYS  Final   Report Status 08/18/2015 FINAL  Final  C difficile quick scan w PCR reflex     Status: None   Collection Time: 08/17/15 11:34 AM  Result Value Ref Range Status   C Diff antigen NEGATIVE NEGATIVE Final   C Diff toxin NEGATIVE NEGATIVE Final   C Diff interpretation Negative for C. difficile  Final  Stool culture     Status: None (Preliminary result)   Collection Time: 09/03/15  3:51 PM  Result Value Ref Range Status   Specimen Description STOOL  Final   Special Requests Immunocompromised  Final   Culture   Final    HOLDING FOR POSSIBLE PATHOGEN No Pathogenic E. coli detected NO CAMPYLOBACTER DETECTED    Report Status PENDING  Incomplete     Coagulation Studies: No results for input(s): LABPROT, INR in the last 72 hours.  Urinalysis: No results for input(s): COLORURINE, LABSPEC, PHURINE, GLUCOSEU, HGBUR, BILIRUBINUR, KETONESUR, PROTEINUR, UROBILINOGEN, NITRITE, LEUKOCYTESUR in the last 72 hours.  Invalid input(s): APPERANCEUR    Imaging: No results found.   Medications:     . acidophilus  1 capsule Oral Daily  . amantadine  200 mg Oral Q7 days  . antiseptic oral rinse  7 mL Mouth Rinse q12n4p  . chlorhexidine  15 mL Mouth Rinse BID  . epoetin (EPOGEN/PROCRIT) injection  10,000 Units Intravenous Q T,Th,Sa-HD  . feeding supplement (JEVITY 1.2 CAL)  1,000 mL Per Tube Q24H  . feeding supplement (PRO-STAT SUGAR FREE 64)  30 mL Oral 6 times per day  . free water  200 mL Per Tube 3 times per day  . heparin subcutaneous  5,000 Units Subcutaneous Q12H  . hydrocortisone  10 mg Per Tube BID  . lidocaine  1 patch Transdermal Q24H  . megestrol  200 mg Per Tube Daily  . midodrine  10 mg Oral TID WC  . multivitamin  5 mL Per Tube Daily  . nystatin   Topical TID  . pantoprazole sodium  40 mg Per Tube Q1200  . sodium chloride  10-40 mL Intracatheter Q12H  . sodium hypochlorite   Irrigation BID   sodium chloride, sodium chloride, sodium chloride, sodium chloride, acetaminophen (TYLENOL) oral liquid 160 mg/5 mL, alteplase, alteplase, anticoagulant sodium citrate, ipratropium-albuterol, lidocaine (PF), lidocaine-prilocaine, loperamide, oxyCODONE-acetaminophen, pentafluoroprop-tetrafluoroeth, sodium chloride  Assessment/ Plan:  49 y.o. 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  1. End-stage renal disease on hemodialysis on HD MWF. The patient has been on dialysis since October of 2014. R IJ permcath.  -  Next HD tomorrow  2. Anemia of CKD: status post PRBC transfusions. Hgb down to 8.1 - continue epogen IV with HD.   3. Secondary hyperparathyroidism: PTH low at 55,  . - no indication for binders or activated vitamin D at this point    4. Hypotension: required vasopressors earlier in admission. - continue midodrine  po tid.  We have also been giving PRN albumin to keep BP up during HD, but despite this UF has been limited by hypotension.  5.  Dispo:  Unclear at this time. Has to be able to sit in chair for the duration of treatment prior to outpatient discharge Otherwise may need to look into stretcher dialysis facilities   LOS: 25 Courtney [name] 10/18/201612:06 PM

## 2015-09-06 NOTE — Evaluation (Signed)
Objective Swallowing Evaluation: Other (Comment) (MBSS)  Patient Details  Name: Courtney [name] MRN: [id number] Date of Birth: Jun 03, 1966  Today's Date: 09/06/2015 Time: SLP Start Time (ACUTE ONLY): 1000-SLP Stop Time (ACUTE ONLY): 1130 SLP Time Calculation (min) (ACUTE ONLY): 90 min  Past Medical History:  Past Medical History  Diagnosis Date  . Obesity   . Dyslipidemia   . Hypertension   . Coronary artery disease     s/p BMS 2010 LAD  . Dysrhythmia     ventricular tachycardia resolved after LAD stent and beta blocker  . Diabetes mellitus     with retinopathy, neuropathy and microalbuminemia  . ESRD (end stage renal disease) on dialysis    Past Surgical History:  Past Surgical History  Procedure Laterality Date  . Cholecystectomy  1990  . Pci lad  12/2010  . Colonoscopy  08/2011  . Left heart catheterization with coronary angiogram N/A 01/28/2012    Procedure: LEFT HEART CATHETERIZATION WITH CORONARY ANGIOGRAM;  Surgeon: Lesleigh Noe, MD;  Location: [facility] CATH LAB;  Service: Cardiovascular;  Laterality: N/A;  . Tracheostomy    . Gastric bypass     HPI:  Other Pertinent Information: Pt is a 52 F who has been in medical facilities (hosp, LTAC, rehab) for 2 yrs following gastric bypass surgery with multiple complications. Now with chronic trach, ESRD, profound debilitation, severe sacral pressure ulcer. Was seemingly making progress and transferred to rehab facility approx one week prior to this admission. She was sent to [facility] ED with AMS and hypotension. Working dx of severe sepsis/septic shock due to infected sacral pressure ulcer. Also has R side externalized HD cath and tunneled L IJ CVL. She has history of resistant bacterial infections. She is on chronic steroids. Her husband indicates that she has been diagnosed with adrenal insuff at some point during the past 2 yrs. 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 Upon discussion w/ husband, pt  was eating an oral diet at [employer] WITH the trach Capped; tolerated this adequately w/ no deficits. Pt has been responsive to verbal stim and following a few basic commands such as stick out tongue w/ Neurologist. 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 Pt has passed the PMV eval w/ f/u tx session now but requires 100% supervision when wearing the valve. She has only phonated x2 during PMV use w/ SLP; no decline in O2 sats or discomfort noted. Pt currently has an NG tube in place for TFs for nutrition.   No Data Recorded  Assessment / Plan / Recommendation CHL IP CLINICAL IMPRESSIONS 09/06/2015  Therapy Diagnosis (No Data)  Clinical Impression Pt presents w/ mild-moderate pharyngeal phase dysphagia; pt tended to piecemeal puree boluses during the oral phase w/ trials of puree given. PMV was placed during this eval; pt was fed using tsp and straw. During the pharyngeal swallow, pt exhibited a delayed pharyngeal swallow initiation resulting in laryngeal penetration of thin liquids w/ single sip; majority of laryngeal penetration appeared to clear w/ completion of the swallow, however, trace-min. amount remained under the epiglottis and in the upper vestibule. Pt would be at increased risk for buiild-up of the laryngeal penetration if more sips were given, especially as fatigue sets in as a meal continues - this would greatly increase risk for aspiration. When given small, 1/2 tsp amounts of thin liquids, no laryngeal penetration or aspiration was noted. The valleculae and pyrifrom sinuses appeared to be able  to hold the volume of fluid. Noted the timing of pharyngeal swallow initiation appeared at the level of BOT-valleculae w/ most trials of Nectar consistency liquids and purees given; no laryngeal penetration or aspiration was noted w/ these consistency. During the oral phase, pt tended to piecemeal puree boluses but appeared to adequately clear the boluses w/ ~3 swallows. No trials of  solids were assessed at this eval sec. to pt's overall weakness at this time. Pt is at increased risk for aspiration sec. to her declined strength and overall weakness; she requires wear of the PMV; she requires feeding. Dietician reported bariatric diet needs as well. Pt is rec'd to have 100% supervision during all meals w/ strict aspiration and Reflux precautions in place. MD and NSG/CM were updated. Precautions posted in room.       CHL IP TREATMENT RECOMMENDATION 09/06/2015  Treatment Recommendations Therapy as outlined in treatment plan below     CHL IP DIET RECOMMENDATION 09/06/2015  SLP Diet Recommendations Dysphagia 1 (Puree);Nectar  Liquid Administration via (None)  Medication Administration Crushed with puree  Compensations Minimize environmental distractions;Slow rate;Small sips/bites  Postural Changes and/or Swallow Maneuvers (None)     CHL IP OTHER RECOMMENDATIONS 09/06/2015  Recommended Consults (No Data)  Oral Care Recommendations Oral care QID;Staff/trained caregiver to provide oral care  Other Recommendations Order thickener from pharmacy;Prohibited food (jello, ice cream, thin soups);Clarify dietary restrictions;Place PMSV during PO intake     CHL IP FOLLOW UP RECOMMENDATIONS 09/05/2015  Follow up Recommendations [facility]illed [facility] facility     [facility] D[facility]  Speech Therapy Frequency (ACUTE ONLY) min 3x week  Treatment Duration 1 week     Pertinent Vitals/Pain Denied pain     SLP Swallow Goals See care plan      CHL IP REASON FOR REFERRAL 09/06/2015  Reason for Referral Objectively evaluate swallowing function     CHL IP ORAL PHASE 09/06/2015  Lips (None)  Tongue (None)  Mucous membranes (None)  Nutritional status (None)  Other (None)  Oxygen therapy (None)  Oral Phase Impaired  Oral - Pudding Teaspoon (None)  Oral - Pudding Cup (None)  Oral - Honey Teaspoon (None)  Oral - Honey Cup (None)  Oral - Honey Syringe (None)  Oral -  Nectar Teaspoon (None)  Oral - Nectar Cup (None)  Oral - Nectar Straw (None)  Oral - Nectar Syringe (None)  Oral - Ice Chips (None)  Oral - Thin Teaspoon (None)  Oral - Thin Cup (None)  Oral - Thin Straw (None)  Oral - Thin Syringe (None)  Oral - Puree (None)  Oral - Mechanical Soft (None)  Oral - Regular (None)  Oral - Multi-consistency (None)  Oral - Pill (None)  Oral Phase - Comment (None)      CHL IP PHARYNGEAL PHASE 09/06/2015  Pharyngeal Phase Impaired  Pharyngeal - Pudding Teaspoon (None)  Penetration/Aspiration details (pudding teaspoon) (None)  Pharyngeal - Pudding Cup (None)  Penetration/Aspiration details (pudding cup) (None)  Pharyngeal - Honey Teaspoon (None)  Penetration/Aspiration details (honey teaspoon) (None)  Pharyngeal - Honey Cup (None)  Penetration/Aspiration details (honey cup) (None)  Pharyngeal - Honey Syringe (None)  Penetration/Aspiration details (honey syringe) (None)  Pharyngeal - Nectar Teaspoon (None)  Penetration/Aspiration details (nectar teaspoon) (None)  Pharyngeal - Nectar Cup (None)  Penetration/Aspiration details (nectar cup) (None)  Pharyngeal - Nectar Straw (None)  Penetration/Aspiration details (nectar straw) (None)  Pharyngeal - Nectar Syringe (None)  Penetration/Aspiration details (nectar syringe) (None)  Pharyngeal - Ice Chips (None)  Penetration/Aspiration details (ice chips) (None)  Pharyngeal - Thin Teaspoon (None)  Penetration/Aspiration details (thin teaspoon) (None)  Pharyngeal - Thin Cup (None)  Penetration/Aspiration details (thin cup) (None)  Pharyngeal - Thin Straw (None)  Penetration/Aspiration details (thin straw) (None)  Pharyngeal - Thin Syringe (None)  Penetration/Aspiration details (thin syringe') (None)  Pharyngeal - Puree (None)  Penetration/Aspiration details (puree) (None)  Pharyngeal - Mechanical Soft (None)  Penetration/Aspiration details (mechanical soft) (None)  Pharyngeal - Regular (None)   Penetration/Aspiration details (regular) (None)  Pharyngeal - Multi-consistency (None)  Penetration/Aspiration details (multi-consistency) (None)  Pharyngeal - Pill (None)  Penetration/Aspiration details (pill) (None)  Pharyngeal Comment majority of laryngeal penetration appeared to clear w/ completion of the swallow, however, trace-min. amount remained under the epiglottis and in the vestibule.      CHL IP CERVICAL ESOPHAGEAL PHASE 09/06/2015  Cervical Esophageal Phase WFL  Pudding Teaspoon (None)  Pudding Cup (None)  Honey Teaspoon (None)  Honey Cup (None)  Honey Straw (None)  Nectar Teaspoon (None)  Nectar Cup (None)  Nectar Straw (None)  Nectar Sippy Cup (None)  Thin Teaspoon (None)  Thin Cup (None)  Thin Straw (None)  Thin Sippy Cup (None)  Cervical Esophageal Comment (None)            Jerilynn Som, MS, [name]  [name],[name] 09/06/2015, 12:21 PM

## 2015-09-07 ENCOUNTER — Inpatient Hospital Stay: Payer: 59

## 2015-09-07 DIAGNOSIS — K922 Gastrointestinal hemorrhage, unspecified: Secondary | ICD-10-CM

## 2015-09-07 DIAGNOSIS — R7881 Bacteremia: Secondary | ICD-10-CM

## 2015-09-07 LAB — LACTIC ACID, PLASMA: Lactic Acid, Venous: 1.4 mmol/L (ref 0.5–2.0)

## 2015-09-07 LAB — STOOL CULTURE

## 2015-09-07 LAB — BLOOD GAS, VENOUS
Acid-Base Excess: 8.6 mmol/L — ABNORMAL HIGH (ref 0.0–3.0)
BICARBONATE: 33.5 meq/L — AB (ref 21.0–28.0)
FIO2: 0.4
O2 Saturation: 74.9 %
PATIENT TEMPERATURE: 37
pCO2, Ven: 46 mmHg (ref 44.0–60.0)
pH, Ven: 7.47 — ABNORMAL HIGH (ref 7.320–7.430)
pO2, Ven: 37 mmHg (ref 30.0–45.0)

## 2015-09-07 LAB — GLUCOSE, CAPILLARY
Glucose-Capillary: 144 mg/dL — ABNORMAL HIGH (ref 65–99)
Glucose-Capillary: 83 mg/dL (ref 65–99)

## 2015-09-07 MED ORDER — NOREPINEPHRINE 4 MG/250ML-% IV SOLN
0.0000 ug/min | INTRAVENOUS | Status: DC
  Administered 2015-09-07: 4 ug/min via INTRAVENOUS
  Administered 2015-09-07: 8 ug/min via INTRAVENOUS
  Administered 2015-09-08: 0 ug/min via INTRAVENOUS
  Administered 2015-09-10: 15 ug/min via INTRAVENOUS
  Filled 2015-09-07 (×2): qty 250

## 2015-09-07 MED ORDER — NOREPINEPHRINE 4 MG/250ML-% IV SOLN
INTRAVENOUS | Status: AC
  Administered 2015-09-07: 4 mg
  Filled 2015-09-07: qty 250

## 2015-09-07 MED ORDER — NOREPINEPHRINE BITARTRATE 1 MG/ML IV SOLN: 0.0000 ug/min | INTRAVENOUS | Status: DC

## 2015-09-07 MED ORDER — CLINDAMYCIN PHOSPHATE 600 MG/50ML IV SOLN
600.0000 mg | Freq: Three times a day (TID) | INTRAVENOUS | Status: DC
  Administered 2015-09-07 – 2015-09-12 (×14): 600 mg via INTRAVENOUS
  Filled 2015-09-07 (×16): qty 50

## 2015-09-07 MED ORDER — CLINDAMYCIN PHOSPHATE 300 MG/50ML IV SOLN
300.0000 mg | Freq: Four times a day (QID) | INTRAVENOUS | Status: DC
  Filled 2015-09-07 (×3): qty 50

## 2015-09-07 MED ORDER — EPOETIN ALFA 10000 UNIT/ML IJ SOLN
10000.0000 [IU] | INTRAMUSCULAR | Status: DC
  Filled 2015-09-07: qty 1

## 2015-09-07 NOTE — Progress Notes (Signed)
Rapid response called for patient with low blood pressure during dialysis, SBP 80 with MAP 62. Patient initially responsive, open eyes when spoken to. Dr. Thedore MinsSingh at bedside, patient not responding to verbal stimuli. Call to Dr. Belia HemanKasa and notified of above, per Dr. Belia HemanKasa transfer to unit for Levophed drip. Dr. Thedore MinsSingh spoke with patient husband on telephone and advised of patient being moved to CCU. After patient moved to unit, patient SBP 100s with MAP 72. Continue to monitor patients b/p every thirty minutes. See eMar for initiation of Levophed drip. 18:00 Husband at bedside, updated on patient status and POC, concerning Levophed and antibiotic administration. Expressed appreciation of nursing care.

## 2015-09-07 NOTE — Progress Notes (Signed)
Pt has been resting quietly. Dressing on sacral ulcer changed and performed trach care. SpO2 98% on trach collar. Nursing staff will continue to monitor. Lamonte RicherKara A Tiwatope Emmitt, RN

## 2015-09-07 NOTE — Progress Notes (Addendum)
Speech Language Pathology Treatment: Dysphagia  Patient Details Name: Courtney [name] MRN: [id number] DOB: [birth year] Today's Date: 09/07/2015 Time: 6962-9528 SLP Time Calculation (min) (ACUTE ONLY): 45 min  Assessment / Plan / Recommendation Clinical Impression  Pt presented w/ apparent, adequate toleration of po trials of Nectar liquids by tsp, then straw; and boluses of puree via 1/2 tsp amounts(2 trials) - pt would not accept more despite max. Verbal/Tactile cues. Pt consumed 3-4 small, single sips of Nectar via straw then ~same w/ NSG(liquid medicine). Pt exhibited no overt s/s of aspiration(no coughing or increased wet respirations, no decline in O2 sats immediately following the trials). Oral phase c/b min. prolonged time for swallowing and clearing(used f/u swallows at times). After the ~6 trials total w/ SLP, pt would not accept more po's of liquids or purees despite max. cues. HOWEVER, pt was noted to have moderate, continuous belching after initial trials which continued after the po's stopped. This is GREATLY concerning for retrograde activity/Refluxing of food/liquid material which places pt at HIGH risk for aspiration of such if the material returns to the level of the UES/pyriform sinuses. The fact that the NG tube is in place brings up the concern of the ability for the UES muscle to maintain sufficient, tight closure to reduce the risk for Reflux material re-entering the pharynx. PMV was placed during the po trials given.  During the tx session today, pt was able to tolerate placement of the PMV for any attempted communication as well as during the po trials given during the swallowing tx session. Pt maintained O2 sats b/t 89-97 the majority of the session; no significant change in HR despite rest breaks. No increase in tracheal secretions noted. Pt did not attempt to phonate or communicate during the placement of the PMV. No discomfort during use/wear was observable - no change in  presentation when PMV removed. Pt appeared to tolerate PMV placement w/out desaturation or discomfort overall.    HPI Other Pertinent Information: Pt is a 12 F who has been in medical facilities (hosp, LTAC, rehab) for 2 yrs following gastric bypass surgery with multiple complications. Now with chronic trach, ESRD, profound debilitation, severe sacral pressure ulcer. Was seemingly making progress and transferred to rehab facility approx one week prior to this admission. She was sent to [facility] ED with AMS and hypotension. Working dx of severe sepsis/septic shock due to infected sacral pressure ulcer. Also has R side externalized HD cath and tunneled L IJ CVL. She has history of resistant bacterial infections. She is on chronic steroids. Her husband indicates that she has been diagnosed with adrenal insuff at some point during the past 2 yrs. 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 Upon discussion w/ husband, pt was eating an oral diet at [employer] WITH the trach Capped; tolerated this adequately w/ no deficits. Pt has been responsive to verbal stim and following a few basic commands such as stick out tongue w/ Neurologist. 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 Pt has passed the PMV eval w/ f/u tx session now but requires 100% supervision when wearing the valve. She has only phonated x2 during PMV use w/ SLP; no decline in O2 sats or discomfort noted. Pt currently has an NG tube in place for TFs for nutrition. [name] wants the NG tube to remain in place w/ TFs at this time while po diet begins. Dietician monitoring amounts.   Pertinent Vitals Pain Assessment: Faces (  as she was slid up in the bed she grimaced) Faces Pain Scale: Hurts even more (?) Pain Descriptors / Indicators: Grimacing Pain Intervention(s): Repositioned;Relaxation  SLP Plan  Continue with current plan of care    Recommendations Diet recommendations: Dysphagia 1 (puree);Nectar-thick  liquid (by tsp and straw) Liquids provided via: Teaspoon;Straw Medication Administration: Crushed with puree Supervision: Staff to assist with self feeding;Full supervision/cueing for compensatory strategies;Trained caregiver to feed patient Compensations: Minimize environmental distractions;Slow rate;Small sips/bites Postural Changes and/or Swallow Maneuvers: Seated upright 90 degrees (Reflux precautions)      Patient may use Passy-Muir Speech Valve: with SLP only;Caregiver trained to provide supervision PMSV Supervision: Full       General recommendations: Rehab consult (TBD) Oral Care Recommendations: Oral care QID;Staff/trained caregiver to provide oral care Follow up Recommendations: Skilled Nursing facility Plan: Continue with current plan of care    GO    Courtney SomKatherine [name], MS, [name]  [name],Courtney 09/07/2015, 11:39 AM

## 2015-09-07 NOTE — Consult Note (Signed)
Cardiology Consultation Note  Patient ID: Courtney [name], MRN: [id number], DOB/AGE: 1966-02-06 49 y.o. Admit date: 2015/08/28   Date of Consult: 09/07/2015 Primary Physician: Shane Crutch, MD Primary Cardiologist: none  Chief Complaint:  Reason for Consult: TEE   HPI: 49 y.o. 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 She has been in facilities for over a year. She presented with stage IV sacral decubiti.  She has been on dialysis since October 2014. Long hospital course, please refer to critical care note by Santiago Glad. Today, following dialysis she developed hypotension which did not respond to usual measures including saline bolus. She was transferred to intensive care unit, started on levo fed. Hemoglobin down to 8.1. She receives Epogen with hemodialysis. She has had packed red blood cell transfusions per the notes.   Nephrology has started her on midodrine 10 mg 3 times a day. Despite this HD has been limited by hypotension  Cardiology was consult for TEE, to rule out endocarditis   Past Medical History  Diagnosis Date  . Obesity   . Dyslipidemia   . Hypertension   . Coronary artery disease     s/p BMS 2010 LAD  . Dysrhythmia     ventricular tachycardia resolved after LAD stent and beta blocker  . Diabetes mellitus     with retinopathy, neuropathy and microalbuminemia  . ESRD (end stage renal disease) on dialysis       Most Recent Cardiac Studies: Echocardiogram on arrival showed Small cavity size of the left ventricle. There was moderate  concentric hypertrophy. Systolic function was normal. The estimated ejection fraction was in the range of 55% to 60%. Wall motion was normal; there were no regional wall motion abnormalities. Doppler parameters are consistent with abnormal left ventricular relaxation (grade 1 diastolic dysfunction). - Mitral valve: There was mild regurgitation. - Left atrium: The atrium was normal in size. - Right ventricle: Systolic function was normal. - Pulmonary arteries: Systolic pressure was mildly elevated. PA peak pressure: 39 mm Hg (S).   Surgical History:  Past Surgical History  Procedure Laterality Date  . Cholecystectomy  1990  . Pci lad  12/2010  . Colonoscopy  08/2011  . Left heart catheterization with coronary angiogram N/A 01/28/2012    Procedure: LEFT HEART CATHETERIZATION WITH CORONARY ANGIOGRAM;  Surgeon: Lesleigh Noe, MD;  Location: [facility] CATH LAB;  Service: Cardiovascular;  Laterality: N/A;  . Tracheostomy    . Gastric bypass       Home Meds: Prior to Admission medications   Medication Sig Start Date End Date Taking? Authorizing Provider  b complex-vitamin c-folic acid (NEPHRO-VITE) 0.8 MG TABS tablet Take 1 tablet by mouth every evening.   Yes Historical Provider, MD  carvedilol (COREG) 25 MG tablet Take 25 mg by mouth every evening.   Yes Historical Provider, MD  collagenase (SANTYL) ointment Apply 1 application topically daily.   Yes Historical Provider, MD  Darbepoetin Alfa (ARANESP) 100 MCG/0.5ML SOSY injection Inject 100 mcg into the skin every 7 (seven) days. On Saturday at dialysis   Yes Historical Provider, MD  escitalopram (LEXAPRO) 5 MG tablet Take 5 mg by mouth at bedtime.   Yes Historical Provider, MD  heparin 5000 UNIT/ML injection Inject 5,000 Units into the skin every 8 (eight) hours.   Yes Historical Provider, MD  insulin lispro (HUMALOG) 100 UNIT/ML injection Inject into the skin 4 (four) times daily -  before meals and at bedtime. Per sliding scale    Yes Historical Provider, MD  insulin lispro (HUMALOG) 100 UNIT/ML injection Inject into the skin 3 (three) times daily. Per sliding scale   Yes Historical Provider, MD  lidocaine (LIDODERM) 5 % Place 1 patch onto the skin daily. Remove & Discard patch within 12 hours or as directed by MD   Yes Historical Provider, MD  lisinopril (PRINIVIL,ZESTRIL) 40 MG tablet Take 40 mg by mouth every evening.   Yes Historical Provider, MD  megestrol (MEGACE) 400 MG/10ML suspension Take 800 mg by mouth 2 (two) times daily.   Yes Historical Provider, MD  mirtazapine (REMERON) 15 MG tablet Take 15 mg by mouth at bedtime.   Yes Historical Provider, MD  oxyCODONE (OXY IR/ROXICODONE) 5 MG immediate release tablet Take 5 mg by mouth every 8 (eight) hours.   Yes Historical Provider, MD  oxyCODONE (OXY IR/ROXICODONE) 5 MG immediate release tablet Take 5 mg by mouth every 8 (eight) hours as needed for moderate pain.   Yes Historical Provider, MD  potassium chloride SA (K-DUR,KLOR-CON) 20 MEQ tablet Take 20 mEq by mouth every evening.   Yes Historical Provider, MD  predniSONE (DELTASONE) 10 MG tablet Take 10 mg by mouth 2 (two) times daily.   Yes Historical Provider, MD  psyllium (METAMUCIL) 58.6 % powder Take 1 packet by mouth 2 (two) times daily.   Yes Historical Provider, MD  thiamine (VITAMIN B-1) 100 MG tablet Take 100 mg by mouth every evening.   Yes Historical Provider, MD    Inpatient Medications:  . acidophilus  1 capsule Oral Daily  . amantadine  200 mg Oral Q7 days  . antiseptic oral rinse  7 mL Mouth Rinse q12n4p  . chlorhexidine  15 mL Mouth Rinse BID  . clindamycin (CLEOCIN) IV  600 mg Intravenous 3 times per day  . [START ON 09/09/2015] epoetin (EPOGEN/PROCRIT) injection  10,000 Units Intravenous Q M,W,F-HD  . feeding supplement (JEVITY 1.2 CAL)  1,000 mL Per Tube Q24H  . feeding supplement (PRO-STAT SUGAR FREE 64)  30 mL Oral 6 times per day  . free water  200 mL Per Tube 3 times per day  . heparin  subcutaneous  5,000 Units Subcutaneous Q12H  . hydrocortisone  10 mg Per Tube BID  . lidocaine  1 patch Transdermal Q24H  . megestrol  200 mg Per Tube Daily  . midodrine  10 mg Oral TID WC  . multivitamin  5 mL Per Tube Daily  . nystatin   Topical TID  . pantoprazole sodium  40 mg Per Tube Q1200  . sodium chloride  10-40 mL Intracatheter Q12H  . sodium hypochlorite   Irrigation BID   . norepinephrine 8 mcg/min (09/07/15 1628)    Allergies:  Allergies  Allergen Reactions  . Contrast Media [Iodinated Diagnostic Agents] Anaphylaxis  . Ampicillin Rash    Social History   Social History  . Marital Status: Married    Spouse Name: N/A  . Number of Children: N/A  . Years of Education: N/A   Occupational History  . Not on file.   Social History Main Topics  . Smoking status: Never Smoker   . Smokeless tobacco: Never Used  . Alcohol Use: No     Comment: occassional  .  Drug Use: No  . Sexual Activity: Yes   Other Topics Concern  . Not on file   Social History Narrative     Family History  Problem Relation Age of Onset  . Hypertension Father   . Diabetes Father   . Cancer Mother   . Diabetes Paternal Grandfather   . Hypertension Paternal Grandfather   . Diabetes Paternal Grandmother   . Hypertension Paternal Grandmother   . Cancer Maternal Grandmother     colon ca     Review of Systems: Review of Systems  Unable to perform ROS   Labs: No results for input(s): CKTOTAL, CKMB, TROPONINI in the last 72 hours. Lab Results  Component Value Date   WBC 11.1* 09/05/2015   HGB 8.1* 09/05/2015   HCT 25.2* 09/05/2015   MCV 95.8 09/05/2015   PLT 198 09/05/2015    Recent Labs Lab 09/05/15 0450  NA 141  K 4.4  CL 103  CO2 33*  BUN 53*  CREATININE 1.25*  CALCIUM 7.9*  GLUCOSE 65   No results found for: CHOL, HDL, LDLCALC, TRIG No results found for: DDIMER  Radiology/Studies:  Dg Chest 1 View  08/24/2015  CLINICAL DATA:  Shortness of breath. EXAM: CHEST 1  VIEW COMPARISON:  08/18/2015. FINDINGS: Tracheostomy tube, feeding tube, left IJ line, right IJ dialysis catheter in stable position. Cardiomegaly with normal pulmonary vascularity. Mild right infrahilar infiltrate. Tiny left pleural effusion cannot be excluded. No pneumothorax. IMPRESSION: 1. Lines and tubes in stable position. 2. Mild right infrahilar infiltrate. 3. Tiny left pleural effusion cannot be excluded . 4. Stable cardiomegaly.  No pulmonary venous congestion. Electronically Signed   By: Maisie Fus  Register   On: 08/24/2015 07:24   Dg Chest 1 View  08/09/2015  CLINICAL DATA:  Hypotension. Unresponsive patient with tracheostomy in place. EXAM: CHEST 1 VIEW COMPARISON:  08/02/2015 FINDINGS: Cardiac silhouette is top-normal in size. No mediastinal or hilar masses or evidence of adenopathy. Lungs are clear. No pleural effusion or pneumothorax. Right internal jugular dual-lumen central venous catheter is stable with its distal tip in the right atrium. Left internal jugular small caliber dual-lumen central venous catheter is stable with its tip in the mid superior vena cava. Tracheostomy tube is well positioned and also stable. IMPRESSION: 1. Tracheostomy tube and central venous lines are stable as detailed above. 2. No acute cardiopulmonary disease. Electronically Signed   By: Amie Portland M.D.   On: 08/16/2015 09:24   Dg Abd 1 View  08/15/2015  CLINICAL DATA:  OG tube placement EXAM: ABDOMEN - 1 VIEW COMPARISON:  06/15/2015 FINDINGS: OG tube tip is in the distal stomach. Nonobstructive bowel gas pattern. Linear high density areas throughout the abdomen, likely peritoneal calcifications. No visible free air. IMPRESSION: OG tube tip in the distal stomach. Electronically Signed   By: Charlett Nose M.D.   On: 08/15/2015 10:12   Ct Head Wo Contrast  08/16/2015  CLINICAL DATA:  Unresponsive patient.  Leftward gaze. EXAM: CT HEAD WITHOUT CONTRAST TECHNIQUE: Contiguous axial images were obtained from the base  of the skull through the vertex without intravenous contrast. COMPARISON:  MRI 01/28/2012. FINDINGS: No mass lesion, mass effect, midline shift, hydrocephalus, hemorrhage. No territorial ischemia or acute infarction. Unchanged small dilated perivascular spaces are noted in the basal ganglia. IMPRESSION: Negative CT head. Electronically Signed   By: Andreas Newport M.D.   On: 08/08/2015 10:11   Mr Brain Wo Contrast  08/21/2015  CLINICAL DATA:  49 year old hypertensive diabetic female  with end-stage renal disease on dialysis presenting with altered mental status. Hypotensive and 07/27/2015. Subsequent encounter. EXAM: MRI HEAD WITHOUT CONTRAST TECHNIQUE: Multiplanar, multiecho pulse sequences of the brain and surrounding structures were obtained without intravenous contrast. COMPARISON:  08/17/2015 head CT.  01/28/2012 brain MR. FINDINGS: Exam is significantly motion degraded. Multiple acute infarcts. The largest infarct is within the left frontal lobe. Smaller infarct left parietal lobe. Scattered small infarcts noted in a parasagittal distribution right frontal -parietal lobe. Small infarcts occipital lobes and cerebellum bilaterally. No intracranial hemorrhage. No intracranial mass lesion noted on this unenhanced exam. (Evaluation limited by motion). Global atrophy without hydrocephalus. Limited evaluation of intracranial vasculature given the degree of motion. Osseous changes of anemia. Mild spinal stenosis C3-4 and C4-5. Cervical medullary junction unremarkable. Small pituitary gland felt to be incidental finding. IMPRESSION: Exam is significantly motion degraded. Multiple acute infarcts. The largest infarct is within the left frontal lobe. Smaller infarct left parietal lobe. Scattered small infarcts noted in a parasagittal distribution right frontal -parietal lobe. Small infarcts occipital lobes and cerebellum bilaterally. No intracranial hemorrhage. Global atrophy without hydrocephalus. Limited evaluation of  intracranial vasculature given the degree of motion. These results will be called to the ordering clinician or representative by the Radiologist Assistant, and communication documented in the PACS or zVision Dashboard. Electronically Signed   By: Lacy DuverneySteven  Olson M.D.   On: 08/21/2015 16:56   [place] Venous Img Lower Bilateral  08/28/2015  CLINICAL DATA:  Bilateral lower extremity swelling, 2 week duration. Some pain. Previous history deep venous thrombosis. EXAM: BILATERAL LOWER EXTREMITY VENOUS DOPPLER ULTRASOUND TECHNIQUE: Gray-scale sonography with graded compression, as well as color Doppler and duplex ultrasound were performed to evaluate the lower extremity deep venous systems from the level of the common femoral vein and including the common femoral, femoral, profunda femoral, popliteal and calf veins including the posterior tibial, peroneal and gastrocnemius veins when visible. The superficial great saphenous vein was also interrogated. Spectral Doppler was utilized to evaluate flow at rest and with distal augmentation maneuvers in the common femoral, femoral and popliteal veins. COMPARISON:  None. FINDINGS: RIGHT LOWER EXTREMITY Common Femoral Vein: No evidence of thrombus. Normal compressibility, respiratory phasicity and response to augmentation. Saphenofemoral Junction: No evidence of thrombus. Normal compressibility and flow on color Doppler imaging. Profunda Femoral Vein: No evidence of thrombus. Normal compressibility and flow on color Doppler imaging. Femoral Vein: No evidence of thrombus. Normal compressibility, respiratory phasicity and response to augmentation. Popliteal Vein: No evidence of thrombus. Normal compressibility, respiratory phasicity and response to augmentation. Calf Veins: Poorly seen because of adjacent arterial calcification Venous Reflux:  None. Other Findings:  None. LEFT LOWER EXTREMITY Common Femoral Vein: No evidence of thrombus. Normal compressibility, respiratory phasicity and  response to augmentation. Saphenofemoral Junction: No evidence of thrombus. Normal compressibility and flow on color Doppler imaging. Profunda Femoral Vein: No evidence of thrombus. Normal compressibility and flow on color Doppler imaging. Femoral Vein: No evidence of thrombus. Normal compressibility, respiratory phasicity and response to augmentation. Popliteal Vein: No evidence of thrombus. Normal compressibility, respiratory phasicity and response to augmentation. Calf Veins: Poorly seen because of adjacent arterial calcification. Venous Reflux:  None. Other Findings:  None. IMPRESSION: No evidence of deep venous thrombosis. The calf veins are poorly seen on each side because of adjacent arterial calcification. No DVT suspected. Electronically Signed   By: Paulina FusiMark  Shogry M.D.   On: 08/28/2015 13:25   [place] Venous Img Upper Uni Right  08/22/2015  CLINICAL DATA:  49 year old female in  the CCU with right arm swelling. EXAM: RIGHT UPPER EXTREMITY VENOUS DOPPLER ULTRASOUND TECHNIQUE: Gray-scale sonography with graded compression, as well as color Doppler and duplex ultrasound were performed to evaluate the upper extremity deep venous system from the level of the subclavian vein and including the jugular, axillary, basilic, radial, ulnar and upper cephalic vein. Spectral Doppler was utilized to evaluate flow at rest and with distal augmentation maneuvers. COMPARISON:  None. FINDINGS: Contralateral Subclavian Vein: Respiratory phasicity is normal and symmetric with the symptomatic side. No evidence of thrombus. Normal compressibility. Internal Jugular Vein: No evidence of thrombus. Normal compressibility. Loss of expected respiratory phasicity. Subclavian Vein: No evidence of thrombus. Patency maintained. Loss of normal respiratory phasicity Axillary Vein: No evidence of thrombus. Flow maintained. Loss of expected respiratory phasicity. Limited visualization of cephalic vein, basilic vein, radial vein and ulnar vein.  Brachial vein: Flow maintained with no thrombus identified. Loss of phasicity. IMPRESSION: Sonographic survey of the right upper extremity negative for DVT, however, loss of respiratory phasicity on the right is suggestive of a more central stenosis/occlusion in this patient with indwelling central catheters. Further evaluation for a more central stenosis/occlusion may be warranted, with potential imaging studies including a formal venogram or CT venogram. These results were called by telephone at the time of interpretation on 08/22/2015 at 11:09 am to Dr. Belia Heman, who verbally acknowledged these results. Signed, Yvone Neu. Loreta Ave, DO [facility] Electronically Signed   By: Gilmer Mor D.O.   On: 08/22/2015 11:12   Dg Chest Port 1 View  09/07/2015  CLINICAL DATA:  Aspiration pneumonia EXAM: PORTABLE CHEST 1 VIEW COMPARISON:  Five days ago FINDINGS: Dialysis catheter from the right with tip stable at the upper right atrium. Tracheostomy tube and left IJ central line have a stable position. Feeding tube enters the stomach at least. New bibasilar opacity. No edema, effusion, or air leak. Chronic mild cardiomegaly. IMPRESSION: 1. Bibasilar atelectasis or pneumonia that is new from 09/02/2015. 2. Unchanged positioning of tubes and lines. Electronically Signed   By: Marnee Spring M.D.   On: 09/07/2015 14:02   Dg Chest Port 1 View  09/02/2015  CLINICAL DATA:  Chronic respiratory failure EXAM: PORTABLE CHEST 1 VIEW COMPARISON:  08/24/2015 FINDINGS: Tracheostomy at the thoracic inlet. Lungs are clear.  No pleural effusion or pneumothorax. Cardiomegaly. Right IJ venous catheter the cavoatrial junction. Left IJ venous catheter the mid SVC. Enteric tube courses into the distal stomach. IMPRESSION: Tracheostomy the thoracic inlet. Stable support apparatus as above. No evidence of acute cardiopulmonary disease. Electronically Signed   By: Charline Bills M.D.    On: 09/02/2015 12:58   Dg Chest Port 1 View  08/18/2015  CLINICAL DATA:  Acute pulmonary edema EXAM: PORTABLE CHEST 1 VIEW COMPARISON:  08/13/2015 FINDINGS: Tracheostomy tube is appropriately positioned. Feeding tube tip terminates over the expected location of the second portion of the duodenum. Right IJ approach hemodialysis catheter terminates over the right atrium. Moderate enlargement of the cardiomediastinal silhouette is reidentified with curvilinear right lower lobe presumed atelectasis. Trace pleural fluid. IMPRESSION: Cardiomegaly without focal acute finding. Electronically Signed   By: Christiana Pellant M.D.   On: 08/18/2015 18:52   Dg Chest Port 1 View  08/13/2015  CLINICAL DATA:  Respiratory failure EXAM: PORTABLE CHEST 1 VIEW COMPARISON:  08/07/2015 FINDINGS: Support devices are unchanged. Cardiomegaly with vascular congestion. Left lower lobe atelectasis or infiltrate has increased. Suspect small left effusion. No confluent opacity on the right. IMPRESSION: Increasing left  lower lobe atelectasis or infiltrate with small left effusion. Electronically Signed   By: Charlett Nose M.D.   On: 08/13/2015 09:31   Dg Abd Portable 1v  09/03/2015  CLINICAL DATA:  NG tube placement EXAM: PORTABLE ABDOMEN - 1 VIEW COMPARISON:  08/15/2015 FINDINGS: Feeding tube tip is in the right upper quadrant consistent with location in the distal stomach or proximal duodenum. Diffuse calcification throughout the abdomen likely representing old peritoneal infection. No change since previous study. Bowel gas pattern is grossly unremarkable. IMPRESSION: Feeding tube tip in the right upper quadrant consistent with location in the distal stomach or proximal duodenum. Unchanged appearance of diffuse peritoneal calcification likely indicating old peritonitis. Electronically Signed   By: Burman Nieves M.D.   On: 09/03/2015 04:09    EKG: nsr with PVCs  Weights: Filed Weights   08/31/15 0558 09/07/15 0508 09/07/15 1033    Weight: 185 lb 3 oz (84 kg) 197 lb (89.359 kg) 196 lb 13.9 oz (89.3 kg)     Physical Exam:NSR on tele Blood pressure 89/44, pulse 113, temperature 98.7 F (37.1 C), temperature source Oral, resp. rate 20, height  (1.753 m), weight 196 lb 13.9 oz (89.3 kg), SpO2 100 %. Body mass index is 29.06 kg/(m^2). General: Muscle atrophy of the legs, unarousable Head: Normocephalic, atraumatic, sclera non-icteric, no xanthomas, nares are without discharge.  Neck: Negative for carotid bruits. JVD not elevated. Lungs: Clear bilaterally to auscultation without wheezes, rales, or rhonchi. Only appreciated anteriorly Heart: RRR with S1 S2. No murmurs, rubs, or gallops appreciated. Abdomen: Soft, non-tender, non-distended with normoactive bowel sounds. No hepatomegaly. No rebound/guarding. No obvious abdominal masses. Msk:  Strength and tone appear normal for age. Extremities: Contractures of her distal extremities, No edema.  Distal pedal pulses are 1+ Neuro: Sedated/not alert, eyes closed, will not respond Psych: Will not respond to verbal stimuli     Assessment and Plan:   Numerous medical issues in this 49 year old woman with end-stage renal disease on hemodialysis, osteomyelitis, sacral decubitus infection, episodes of severe hypotension following dialysis.   Concern for endocarditis was raised and request for TEE placed Currently patient is on levo fed, borderline low blood pressures ranging from 80-100 systolic.  Certainly procedure could be done,  though would wait until hemodynamics improve. Mild sedation would likely be needed for the procedure, would likely drop her blood pressure further Viscous lidocaine and spray used for numbing of the oropharynx. She would have difficulty protecting her airway and would be high risk of aspiration of her secretions.  Once hemodynamically stable, would recommend that the team discussed with the patient's husband. If he is willing to proceed despite  risk and benefit, this could be arranged.   Recent echocardiogram with no significant valve disease noted.  Determination of a vegetation would likely not change management at this time, except for extending the length of antibiotic infusion.  ----Hypotensive episodes following dialysis are likely to be expected given the small LV size, significant LVH. She may need more frequent HD, run at a slower rate Agree with midodrine for blood pressure support  Signed, Dossie Arbour, MD [facility] 09/07/2015, 7:40 PM

## 2015-09-07 NOTE — Progress Notes (Signed)
49 yr old with very complicated history well known to my associates.  Asked to re-evaluate her sacral decubitus ulcer. She had just had an episode of hypotension after dialysis and was moved to the ICU.    Filed Vitals:   09/07/15 1900  BP: 129/51  Pulse: 106  Temp:   Resp: 21   PE:  Gen: opens eyes and looks around but non-verbal Res: Crackles in bases bilaterally Cardio: RRR Abd: soft, nt,nd, well healed surgical scars Sacrum: 15cm x 12cm sacral wound with some fibrinous gray material on right lateral boarder and at 3cm in base, debrided at bedside bluntly, otherwise wound bed healthy and pink  Ext: 2+ edema bilaterally  A/P:  Sacral decubitus: bluntly debrided tissue with gauze at bedside, continue Dakin's soaked kerlex gauze to wound twice daily and pressure off loading.

## 2015-09-07 NOTE — Progress Notes (Signed)
[facility] Critical Care Medicine Progess Note  Name: Courtney [name] MRN: [id number] DOB: 1966/10/17    ADMISSION DATE:  07/28/2015 ADMISSION DATE: 07/31/2015  INTERVAL HISTORY:  10/19-patient with minimal amounts of food this AM, will plan to continue oral intake along with Tube feedings and assess calorie count This was discussed with Husband, plan to stop TF's tomorrow if she tolerates oral feeds and remove NG tube in next 2 days Will talk and discuss with Husband that patient is very high risk for aspiration-will obtain Surgery consult to reassess decub ulcer Will consult cardiology to assess for TEE  10/18 discussed with husband-patient passed swallow eval spoke with Speech Therapist Will add Pureed thick nectar foods and continue Enteral feeds through NG tube.  Will add Megace   On 10/16  the patient's husband was upset due to placement of a rectal tube by on-call physician, this was removed. It was also noted that the pt developed diarrhea, therefore Jevity was changed from 1.5 with fiber to Jevity 1.2; the rate was decreased to 20 cc/hr and the pt was given a dose of lomotil, which has helped the diarrhea. It was also sent for stool culture and Cdif.   On 10/17 discussed with husband options going forward. He would like to transfer to [facility] as it is closer to him, also  [facility] is there so he can have his wife evaluated better and that he can have the [employer] come and check on her status personally-I have discussed case with Hospitalist and they pretty much have refused to accept her at this time.   -He declines transferof his wife back to [place].   -He would like to repeat speech evaluation and Pasey-Muir valve as he hopes that his wife will be able to eat and then will not need a feeding tube. He understands that she may be "stuck" in the hospital as currently no local LTAC is accepting her. He is continuing discussions with [facility].   -He would like his wife restarted  on aspirin due to stroke risk. It was explained that given her life threatening bleed I did not feel comfortable restarting this medication at this time, but he could revisit this discussion with other physicians in the future.    INITIAL PRESENTATION:  54 F who has been in medical facilities (hosp, LTAC, rehab) for 2 yrs following gastric bypass surgery with multiple complications. Now with chronic trach, ESRD, profound debilitation, severe sacral pressure ulcer. Was seemingly making progress and transferred to rehab facility approx one week prior to this admission. She was sent to [facility] ED with AMS and hypotension. Working dx of severe sepsis/septic shock due to infected sacral pressure ulcer. Also has R side externalized HD cath and tunneled L IJ CVL. She has history of resistant bacterial infections. She is on chronic steroids. Her husband indicates that she has been diagnosed with adrenal insuff at some point during the past 2 yrs  MAJOR EVENTS/TEST RESULTS:  Admission 02/07/14-05/07/14 Admission 07/21/14-09/06/14 Discharged to [facility]. Pt had palliative consult at that time, were asked to sign off by husband.  09/23 CT head: NAD 09/23 EEG: no epileptiform activity 09/23 PRBCs for Hgb 6.4 09/24 bedside debridement of sacral wound. Abscess drained 09/25 Off vasopressors. More alert. No distress. Worsening thrombocytopenia. Vanc DC'd 09/29  Dr. Sampson Goon (I.D) excused from the case by patient's husband. 10/02 MRI -multiple infarcts 10/03 tracheal bleeding- transfused platelets 10/03 hospitalist service excused from the case by patient's husband 10/03 Echocardiogram ejection fraction was  55-60%, pulmonary systolic pressure was 39 mmHg 16/10 restart TF's at lower rate, attempt reg HD  10/12 Transferred to med-surg floor. Remains on PCCM service 10/14 SLP eval: pt unable to tolerate PMV adequately 10/17-will re-attempt PM valve-discussed with Speech therapist 10/18 passed swallow eval-start  pureed thick foods no thin liquids-continue NG feeds 10/19-will assess tolerance for oral feeds  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 History of sepsis from C. glabrata MRSA PCR 9/23 >> NEG Urine 9/23 >>  Wound (swab) 9/23 >> multiple organisms Wound (debridement) 9/24 >> Enterococcus, K. Pneumoniae, P. Mirabilis, VRE Wound 9/26 >> No growth Blood 9/23 >> neg CDiff 9/27>>neg  ANTIMICROBIALS:  Aztreonam 9/23 >> 9/24 Vanc 9/23 >> 9/25 Vanc 9/26>>9/27 Daptomycin 9/27>> 10/12 Meropenem 9/23 >> 10/14   SUBJECTIVE:  RASS 0. Nonverbal, does not follow commands, opens eyes to vocal stimuli   VITAL SIGNS: Temp:  [98.7 F (37.1 C)-100.7 F (38.2 C)] 98.7 F (37.1 C) (10/19 1033) Pulse Rate:  [110-124] 117 (10/19 1106) Resp:  [18-20] 20 (10/19 1106) BP: (106-139)/(45-73) 106/45 mmHg (10/19 1106) SpO2:  [81 %-100 %] 99 % (10/19 1106) FiO2 (%):  [28 %-50 %] 40 % (10/19 0508) Weight:  [196 lb 13.9 oz (89.3 kg)-197 lb (89.359 kg)] 196 lb 13.9 oz (89.3 kg) (10/19 1033) HEMODYNAMICS:   VENTILATOR SETTINGS: Vent Mode:  [-]  FiO2 (%):  [28 %-50 %] 40 % INTAKE / OUTPUT:  Intake/Output Summary (Last 24 hours) at 09/07/15 1149 Last data filed at 09/07/15 0958  Gross per 24 hour  Intake   1156 ml  Output      0 ml  Net   1156 ml    PHYSICAL EXAMINATION: Physical Examination:   VS: BP 106/45 mmHg  Pulse 117  Temp(Src) 98.7 F (37.1 C) (Oral)  Resp 20  Ht  (1.753 m)  Wt 196 lb 13.9 oz (89.3 kg)  BMI 29.06 kg/m2  SpO2 99%  General Appearance: No resp distress  Neuro: profoundly diffusely weak HEENT: cushingoid facies, PERRLA, EOM intact Neck: trach site clean Pulmonary: clear anteriorly Cardiovascular: reg, no M  Abdomen: soft, NT,  +BS Extremities: warm, R>L UE edema    LABORATORY PANEL:   CBC  Recent Labs Lab 09/05/15 0450  WBC 11.1*  HGB 8.1*  HCT 25.2*  PLT 198    Chemistries   Recent Labs Lab 09/05/15 0450  NA 141  K 4.4  CL 103  CO2 33*  GLUCOSE 65  BUN 53*  CREATININE 1.25*  CALCIUM 7.9*     CXR: nnf   IMPRESSION/PLAN: PULMONARY A: 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 Diff Stool incontinence Dysphagia P:  Cont PPI Avoid anticoagulants Plan for PM valve daily for several hours at a time  HEMATOLOGIC A:  Acute on chronic anemia Thrombocytopenia - HIT panel negative 9/25 P:  DVT px: SCDs, resume  SQ heparin 10/14 Monitor CBC intermittently Transfuse per usual guidelines plt count 198 10/17  INFECTIOUS A:  Severe sepsis, resolved Sacral decubitus ulcer with abscess P:  Monitor temp, WBC count Micro and abx as above-off abx now  ENDOCRINE A:  DM 2, controlled. 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 Profound deconditioning Chronic pain P: RASS goal: 0 Minimize sedating meds Changed analgesics to PRN oxycodone 10/13 Cont PT  Per Neurology, no indication for TEE presently as she would not be a candidate for anticoagulation  SOCIAL/DISPO: [name] has been somewhat  difficult and has fired ID and Hospitalist services [facility] but she cannot go there with NGT and husband has been reluctant to consent to G-tube placement Care management very involved  I have personally obtained a history, examined the patient, evaluated Pertinent laboratory and RadioGraphic/imaging results, and  formulated the assessment and plan   The Patient requires high complexity decision making for assessment and support, frequent evaluation and titration of therapies.  Husband satisfied with Plan of action and management. All questions answered  Lucie LeatherKurian David Reeda Soohoo, M.D.  [facility]  Medical Director [facility] Medical Director [facility] Cardio-Pulmonary Department

## 2015-09-07 NOTE — Care Management Note (Signed)
Per conversation with Dr. Thedore MinsSingh he has stated that patient will need to be set up at a nursing facility that offers strecker dialysis.  There are no clinics in the area that offer this service that is willing to accept the patient.  Ivor ReiningKim Evanny Ellerbe Dialysis Liaison  [phone]

## 2015-09-07 NOTE — Progress Notes (Signed)
Subjective:  Patient completed about 2 hours of dialysis. She dropped her blood pressure to 89/44, which did not respond to IV saline administration. Total net ultrafiltration is 0. Patient did get midodrine Rapid response team was called. Patient is being currently transferred to ICU and will restarted on Levophed Patient is currently lethargic. She is not following commands but grimacing to pain    Objective:  Vital signs in last 24 hours:  Temp:  [98.7 F (37.1 C)-100.7 F (38.2 C)] 98.7 F (37.1 C) (10/19 1033) Pulse Rate:  [110-124] 113 (10/19 1301) Resp:  [18-22] 22 (10/19 1228) BP: (57-139)/(37-73) 89/44 mmHg (10/19 1301) SpO2:  [81 %-100 %] 98 % (10/19 1301) FiO2 (%):  [28 %-50 %] 40 % (10/19 0508) Weight:  [89.3 kg (196 lb 13.9 oz)-89.359 kg (197 lb)] 89.3 kg (196 lb 13.9 oz) (10/19 1033)  Weight change:  Filed Weights   08/31/15 0558 09/07/15 0508 09/07/15 1033  Weight: 84 kg (185 lb 3 oz) 89.359 kg (197 lb) 89.3 kg (196 lb 13.9 oz)    Intake/Output: I/O last 3 completed shifts: In: 1146 [P.O.:20; NG/GT:1126] Out: 0    Intake/Output this shift:  Total I/O In: 10 [I.V.:10] Out: 0   Physical Exam: General: No acute distress  Head: Normocephalic, atraumatic. +NGT   Eyes: anicteric  Neck: Trach in place  Lungs:  Scattered rhonchi, normal effort  Heart: S1S2 no rubs  Abdomen:  Soft, nontender, BS present  Extremities:  +right upper extremity edema, trace edema of left upper extremity and dependent  Neurologic: Able to nod yes/no to questions  Skin: No acute rash  Access: R IJ permcath    Basic Metabolic Panel:  Recent Labs Lab 09/02/15 0636 09/04/15 0351 09/05/15 0450  NA 143 140 141  K 3.4* 4.1 4.4  CL 106 104 103  CO2 34* 32 33*  GLUCOSE 139* 89 65  BUN 38* 38* 53*  CREATININE 0.82 0.87 1.25*  CALCIUM 7.8* 7.7* 7.9*    Liver Function Tests: No results for input(s): AST, ALT, ALKPHOS, BILITOT, PROT, ALBUMIN in the last 168 hours. No results  for input(s): LIPASE, AMYLASE in the last 168 hours. No results for input(s): AMMONIA in the last 168 hours.  CBC:  Recent Labs Lab 09/02/15 0636 09/04/15 0351 09/05/15 0450  WBC 12.9* 11.5* 11.1*  HGB 8.5* 7.9* 8.1*  HCT 27.5* 25.1* 25.2*  MCV 96.0 96.4 95.8  PLT 162 183 198    Cardiac Enzymes: No results for input(s): CKTOTAL, CKMB, CKMBINDEX, TROPONINI in the last 168 hours.  BNP: Invalid input(s): POCBNP  CBG:  Recent Labs Lab 09/06/15 0007 09/06/15 0739 09/06/15 1112 09/06/15 1638 09/07/15 0020  GLUCAP 72 84 87 103* 144*    Microbiology: Results for orders placed or performed during the hospital encounter of 08-27-2015  Blood Culture (routine x 2)     Status: None   Collection Time: 08-27-2015  8:51 AM  Result Value Ref Range Status   Specimen Description BLOOD [name]  Final   Special Requests BOTTLES DRAWN AEROBIC AND ANAEROBIC  3CC  Final   Culture NO GROWTH 5 DAYS  Final   Report Status 08/17/2015 FINAL  Final  Blood Culture (routine x 2)     Status: None   Collection Time: 27-Aug-2015  9:20 AM  Result Value Ref Range Status   Specimen Description BLOOD LEFT ARM  Final   Special Requests BOTTLES DRAWN AEROBIC AND ANAEROBIC  1CC  Final   Culture NO GROWTH 5 DAYS  Final   Report Status 08/17/2015 FINAL  Final  Wound culture     Status: None   Collection Time: 07/29/2015  9:20 AM  Result Value Ref Range Status   Specimen Description DECUBITIS  Final   Special Requests Normal  Final   Gram Stain   Final    FEW WBC SEEN MANY GRAM NEGATIVE RODS RARE GRAM POSITIVE COCCI    Culture   Final    HEAVY GROWTH ESCHERICHIA COLI MODERATE GROWTH ENTEROBACTER AEROGENES PROTEUS MIRABILIS HEAVY GROWTH ENTEROCOCCUS SPECIES VRE HAVE INTRINSIC RESISTANCE TO MOST COMMONLY USED ANTIBIOTICS AND THE ABILITY TO ACQUIRE RESISTANCE TO MOST AVAILABLE ANTIBIOTICS.    Report Status 08/16/2015 FINAL  Final   Organism ID, Bacteria ESCHERICHIA COLI  Final   Organism ID, Bacteria  ENTEROBACTER AEROGENES  Final   Organism ID, Bacteria PROTEUS MIRABILIS  Final   Organism ID, Bacteria ENTEROCOCCUS SPECIES  Final      Susceptibility   Enterobacter aerogenes - MIC*    CEFTAZIDIME <=1 SENSITIVE Sensitive     CEFAZOLIN >=64 RESISTANT Resistant     CEFTRIAXONE <=1 SENSITIVE Sensitive     CIPROFLOXACIN <=0.25 SENSITIVE Sensitive     GENTAMICIN <=1 SENSITIVE Sensitive     IMIPENEM 1 SENSITIVE Sensitive     TRIMETH/SULFA <=20 SENSITIVE Sensitive     * MODERATE GROWTH ENTEROBACTER AEROGENES   Escherichia coli - MIC*    AMPICILLIN <=2 SENSITIVE Sensitive     CEFTAZIDIME <=1 SENSITIVE Sensitive     CEFAZOLIN <=4 SENSITIVE Sensitive     CEFTRIAXONE <=1 SENSITIVE Sensitive     CIPROFLOXACIN <=0.25 SENSITIVE Sensitive     GENTAMICIN <=1 SENSITIVE Sensitive     IMIPENEM <=0.25 SENSITIVE Sensitive     TRIMETH/SULFA <=20 SENSITIVE Sensitive     * HEAVY GROWTH ESCHERICHIA COLI   Proteus mirabilis - MIC*    AMPICILLIN >=32 RESISTANT Resistant     CEFTAZIDIME <=1 SENSITIVE Sensitive     CEFAZOLIN 8 SENSITIVE Sensitive     CEFTRIAXONE <=1 SENSITIVE Sensitive     CIPROFLOXACIN <=0.25 SENSITIVE Sensitive     GENTAMICIN <=1 SENSITIVE Sensitive     IMIPENEM 1 SENSITIVE Sensitive     TRIMETH/SULFA <=20 SENSITIVE Sensitive     * PROTEUS MIRABILIS   Enterococcus species - MIC*    AMPICILLIN >=32 RESISTANT Resistant     VANCOMYCIN >=32 RESISTANT Resistant     GENTAMICIN SYNERGY SENSITIVE Sensitive     TETRACYCLINE Value in next row Resistant      RESISTANT>=16    * HEAVY GROWTH ENTEROCOCCUS SPECIES  MRSA PCR Screening     Status: None   Collection Time: 07/29/2015  2:38 PM  Result Value Ref Range Status   MRSA by PCR NEGATIVE NEGATIVE Final    Comment:        The GeneXpert MRSA Assay (FDA approved for NASAL specimens only), is one component of a comprehensive MRSA colonization surveillance program. It is not intended to diagnose MRSA infection nor to guide or monitor  treatment for MRSA infections.   Blood culture (single)     Status: None   Collection Time: 07/26/2015  3:36 PM  Result Value Ref Range Status   Specimen Description BLOOD RIGHT ASSIST CONTROL  Final   Special Requests BOTTLES DRAWN AEROBIC AND ANAEROBIC  Final   Culture NO GROWTH 5 DAYS  Final   Report Status 08/17/2015 FINAL  Final  Wound culture     Status: None   Collection Time: 08/13/15 12:37 PM  Result Value Ref Range Status   Specimen Description WOUND  Final   Special Requests Normal  Final   Gram Stain   Final    FEW WBC SEEN TOO NUMEROUS TO COUNT GRAM NEGATIVE RODS FEW GRAM POSITIVE COCCI    Culture   Final    HEAVY GROWTH ESCHERICHIA COLI MODERATE GROWTH PROTEUS MIRABILIS LIGHT GROWTH KLEBSIELLA PNEUMONIAE MODERATE GROWTH ENTEROCOCCUS GALLINARUM CRITICAL RESULT CALLED TO, READ BACK BY AND VERIFIED WITH: [facility] PLL[facility]DRA BORBA AT 1042 08/16/15 DV    Report Status 08/17/2015 FINAL  Final   Organism ID, Bacteria ESCHERICHIA COLI  Final   Organism ID, Bacteria PROTEUS MIRABILIS  Final   Organism ID, Bacteria KLEBSIELLA PNEUMONIAE  Final   Organism ID, Bacteria ENTEROCOCCUS GALLINARUM  Final      Susceptibility   Escherichia coli - MIC*    AMPICILLIN >=32 RESISTANT Resistant     CEFTAZIDIME 4 RESISTANT Resistant     CEFAZOLIN >=64 RESISTANT Resistant     CEFTRIAXONE 16 RESISTANT Resistant     GENTAMICIN 2 SENSITIVE Sensitive     IMIPENEM >=16 RESISTANT Resistant     TRIMETH/SULFA <=20 SENSITIVE Sensitive     Extended ESBL POSITIVE Resistant     PIP/TAZO Value in next row Resistant      RESISTANT>=128    CIPROFLOXACIN Value in next row Sensitive      SENSITIVE<=0.25    * HEAVY GROWTH ESCHERICHIA COLI   Klebsiella pneumoniae - MIC*    AMPICILLIN Value in next row Resistant      SENSITIVE<=0.25    CEFTAZIDIME Value in next row Resistant      SENSITIVE<=0.25    CEFAZOLIN Value in next row Resistant      SENSITIVE<=0.25    CEFTRIAXONE Value in next row Resistant       SENSITIVE<=0.25    CIPROFLOXACIN Value in next row Resistant      SENSITIVE<=0.25    GENTAMICIN Value in next row Sensitive      SENSITIVE<=0.25    IMIPENEM Value in next row Resistant      SENSITIVE<=0.25    TRIMETH/SULFA Value in next row Resistant      SENSITIVE<=0.25    PIP/TAZO Value in next row Resistant      RESISTANT>=128    * LIGHT GROWTH KLEBSIELLA PNEUMONIAE   Proteus mirabilis - MIC*    AMPICILLIN Value in next row Resistant      RESISTANT>=128    CEFTAZIDIME Value in next row Sensitive      RESISTANT>=128    CEFAZOLIN Value in next row Sensitive      RESISTANT>=128    CEFTRIAXONE Value in next row Sensitive      RESISTANT>=128    CIPROFLOXACIN Value in next row Sensitive      RESISTANT>=128    GENTAMICIN Value in next row Sensitive      RESISTANT>=128    IMIPENEM Value in next row Sensitive      RESISTANT>=128    TRIMETH/SULFA Value in next row Sensitive      RESISTANT>=128    PIP/TAZO Value in next row Sensitive      SENSITIVE<=4    * MODERATE GROWTH PROTEUS MIRABILIS   Enterococcus gallinarum - MIC*    AMPICILLIN Value in next row Resistant      SENSITIVE<=4    GENTAMICIN SYNERGY Value in next row Sensitive      SENSITIVE<=4    CIPROFLOXACIN Value in next row Resistant      RESISTANT>=8    TETRACYCLINE Value  in next row Resistant      RESISTANT>=16    * MODERATE GROWTH ENTEROCOCCUS GALLINARUM  Wound culture     Status: None   Collection Time: 08/15/15  2:53 PM  Result Value Ref Range Status   Specimen Description WOUND  Final   Special Requests NONE  Final   Gram Stain FEW WBC SEEN NO ORGANISMS SEEN   Final   Culture NO GROWTH 3 DAYS  Final   Report Status 08/18/2015 FINAL  Final  C difficile quick scan w PCR reflex     Status: None   Collection Time: 08/17/15 11:34 AM  Result Value Ref Range Status   C Diff antigen NEGATIVE NEGATIVE Final   C Diff toxin NEGATIVE NEGATIVE Final   C Diff interpretation Negative for C. difficile  Final  Stool  culture     Status: None   Collection Time: 09/03/15  3:51 PM  Result Value Ref Range Status   Specimen Description STOOL  Final   Special Requests Immunocompromised  Final   Culture   Final    NO SALMONELLA OR SHIGELLA ISOLATED No Pathogenic E. coli detected NO CAMPYLOBACTER DETECTED    Report Status 09/07/2015 FINAL  Final    Coagulation Studies: No results for input(s): LABPROT, INR in the last 72 hours.  Urinalysis: No results for input(s): COLORURINE, LABSPEC, PHURINE, GLUCOSEU, HGBUR, BILIRUBINUR, KETONESUR, PROTEINUR, UROBILINOGEN, NITRITE, LEUKOCYTESUR in the last 72 hours.  Invalid input(s): APPERANCEUR    Imaging: No results found.   Medications:     . acidophilus  1 capsule Oral Daily  . amantadine  200 mg Oral Q7 days  . antiseptic oral rinse  7 mL Mouth Rinse q12n4p  . chlorhexidine  15 mL Mouth Rinse BID  . epoetin (EPOGEN/PROCRIT) injection  10,000 Units Intravenous Q T,Th,Sa-HD  . feeding supplement (JEVITY 1.2 CAL)  1,000 mL Per Tube Q24H  . feeding supplement (PRO-STAT SUGAR FREE 64)  30 mL Oral 6 times per day  . free water  200 mL Per Tube 3 times per day  . heparin subcutaneous  5,000 Units Subcutaneous Q12H  . hydrocortisone  10 mg Per Tube BID  . lidocaine  1 patch Transdermal Q24H  . megestrol  200 mg Per Tube Daily  . midodrine  10 mg Oral TID WC  . multivitamin  5 mL Per Tube Daily  . nystatin   Topical TID  . pantoprazole sodium  40 mg Per Tube Q1200  . sodium chloride  10-40 mL Intracatheter Q12H  . sodium hypochlorite   Irrigation BID   sodium chloride, sodium chloride, sodium chloride, sodium chloride, acetaminophen (TYLENOL) oral liquid 160 mg/5 mL, alteplase, alteplase, anticoagulant sodium citrate, ipratropium-albuterol, lidocaine (PF), lidocaine-prilocaine, loperamide, oxyCODONE-acetaminophen, pentafluoroprop-tetrafluoroeth, sodium chloride  Assessment/ Plan:  49 y.o. 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  1. End-stage renal disease on hemodialysis on HD MWF. The patient has been on dialysis since October of 2014. R IJ permcath.  -  Patient did not tolerate dialysis today due to intradialytic hypotension which did not respond to usual measures  - She is  currently being transferred to the ICU and will be started on Levophed  2. Anemia of CKD: status post PRBC transfusions. Hgb down to 8.1 - continue epogen IV with HD.   3. Secondary hyperparathyroidism: PTH low at 55,  . - no indication for binders or activated vitamin D at this point    4. Hypotension: required vasopressors earlier in admission. - continue midodrine  po tid.  We have also been giving PRN albumin to keep BP up during HD, but despite this UF has been limited by hypotension.  5.  Dispo:  Unclear at this time. Has to be able to sit in chair for the duration of treatment prior to outpatient discharge Otherwise may need to look into stretcher dialysis facilities   LOS: 26 Dyann Goodspeed 10/19/20161:28 PM

## 2015-09-07 NOTE — Progress Notes (Signed)
OT Cancellation Note  Patient Details Name: Courtney [name] MRN: [id number] DOB: February 14, 1966   Cancelled Treatment:      Reason Eval/Treat Not Completed: Patient at procedure or test/unavailable (Patient currently receiving bedside dialysis; unavailable for participation with session at this time. Will re-attempt at later time/date as appropriate.)  Alena BillsWofford,Aleigha Gilani    Vahan Wadsworth, OTR/L ascom [phone] 09/07/2015, 11:32 AM

## 2015-09-07 NOTE — Progress Notes (Signed)
Nutrition Follow-up  DOCUMENTATION CODES:   Severe malnutrition in context of acute illness/injury  INTERVENTION:  Coordination of care: Per MD Kasa planning to continue dobhoff feedings at this time of jevity 1.2 at 39ml/hr and prostat (either via tube or po).  Current tube feeding and prostat providing 1176 kcals and 116 gm of protein. Calorie count started to assess oral intake and nursing aware.  SLP following and aware of current poc.  Meals and snacks: Cater to pt preferences Medical Nutrition Supplements: continue prostat orally or via dobhoff for much needed protein and mightyshake (nectar thick consistency)   NUTRITION DIAGNOSIS:   Inadequate oral intake related to wound healing, acute illness, chronic illness as evidenced by NPO status, estimated needs, being addressed with tube feeding, supplements and po diet   GOAL:   Patient will meet greater than or equal to 90% of their needs    MONITOR:    (Energy Intake, Anthropometrics, Digestive System, Electrolyte/Renal Profile, Glucose Profile)  REASON FOR ASSESSMENT:   Consult Enteral/tube feeding initiation and management  ASSESSMENT:     Events of this pm noted with low blood pressure during dialysis and transfer to ICU   Current Nutrition: Took 3 sips of mightyshake, 2 bites of yogurt and 1/2 prostat this am with SLP feeding and encouraging pt.  Per SLP pt pressing lips together and refusing additional po this am.  No lunch taken due to change in pt status.    Megace started yesterday, per husband report to RD early in admission was taking prior to admission and had ravenous appetite.     Gastrointestinal Profile: Last BM: multiple loose stools noted   Scheduled Medications:  . acidophilus  1 capsule Oral Daily  . amantadine  200 mg Oral Q7 days  . antiseptic oral rinse  7 mL Mouth Rinse q12n4p  . chlorhexidine  15 mL Mouth Rinse BID  . clindamycin (CLEOCIN) IV  600 mg Intravenous 3 times per day  .  [START ON 09/09/2015] epoetin (EPOGEN/PROCRIT) injection  10,000 Units Intravenous Q M,W,F-HD  . feeding supplement (JEVITY 1.2 CAL)  1,000 mL Per Tube Q24H  . feeding supplement (PRO-STAT SUGAR FREE 64)  30 mL Oral 6 times per day  . free water  200 mL Per Tube 3 times per day  . heparin subcutaneous  5,000 Units Subcutaneous Q12H  . hydrocortisone  10 mg Per Tube BID  . lidocaine  1 patch Transdermal Q24H  . megestrol  200 mg Per Tube Daily  . midodrine  10 mg Oral TID WC  . multivitamin  5 mL Per Tube Daily  . nystatin   Topical TID  . pantoprazole sodium  40 mg Per Tube Q1200  . sodium chloride  10-40 mL Intracatheter Q12H  . sodium hypochlorite   Irrigation BID     Electrolyte/Renal Profile and Glucose Profile:   Recent Labs Lab 09/02/15 0636 09/04/15 0351 09/05/15 0450  NA 143 140 141  K 3.4* 4.1 4.4  CL 106 104 103  CO2 34* 32 33*  BUN 38* 38* 53*  CREATININE 0.82 0.87 1.25*  CALCIUM 7.8* 7.7* 7.9*  GLUCOSE 139* 89 65      Weight Trend since Admission: Filed Weights   08/31/15 0558 09/07/15 0508 09/07/15 1033  Weight: 185 lb 3 oz (84 kg) 197 lb (89.359 kg) 196 lb 13.9 oz (89.3 kg)     Diet Order:   dysphagia 1 with nectar thick liquids  Skin:  Stage III hip, stage IV sacrum  and foot unstageable noted   Height:   Ht Readings from Last 1 Encounters:  08-07-2015 5\' 9"  (1.753 m)    Weight:   Wt Readings from Last 1 Encounters:  09/07/15 196 lb 13.9 oz (89.3 kg)     BMI:  Body mass index is 29.06 kg/(m^2).  Estimated Nutritional Needs:   Kcal:  [id number] kcals (BEE 1434, 1.2 AF, 1.2-1.4 IF)   Protein:  113-150 g/kg (1.5-2.0 g/kg) but may be closer to 150-188 g (2.0-2.5 g/kg)   Fluid:  1000 mL plus UOP  EDUCATION NEEDS:   No education needs identified at this time  HIGH Care Level  Betsaida [place] B. Freida BusmanAllen, RD, LDN [phone] (pager)

## 2015-09-07 NOTE — Progress Notes (Addendum)
Dialysis nurse called at 1220 stating patients BP was dropping and asked if midrodine could be given.    Midrodine administered and BP continued to stay low -- SBP 50-60's.    Dr. Thedore MinsSingh and Dr. Belia HemanKasa notified and rapid response called.  Patient was alert and other VS remained stable aside from BP.  Dr. Belia HemanKasa stated for us to continue to monitor patient.    Patient had a few moments of being unable to be aroused, Dr. Thedore MinsSingh present.  Dr. Belia HemanKasa called again and it was determined patient needed to move to CCU for a levophed drip.  BP still low (SBP 88).    Patient's husband was notified.  Dr. Thedore MinsSingh also spoke to husband to give him an update.    Bedside report given to CCU nurse, Pam.

## 2015-09-07 NOTE — Consult Note (Signed)
Rapid Response team called to bedside, patient with low BP during dialysis, 60/50.  Patient less responsive, more encephalopathic.   Will Transfer to Step down Unit for closer monitoring and starting Levaphed infusion, check ABG and LA  Will update husband at some point  I have personally obtained a history, examined the patient, evaluated Pertinent laboratory and RadioGraphic/imaging results, and  formulated the assessment and plan   Overall, patient is critically ill, prognosis is guarded.      Lucie LeatherKurian David Ronte Parker, M.D.  [facility]  Medical Director [facility] Medical Director [facility] Cardio-Pulmonary Department

## 2015-09-07 NOTE — Progress Notes (Signed)
MD order for blood cultures x 2 peripheral. Lab tech x 3 have tried to obtain lab draw, unsuccessful. Notified Dr. Belia HemanKasa of above, per MD cancel order at this time.

## 2015-09-07 NOTE — Progress Notes (Signed)
PT Cancellation Note  Patient Details Name: Courtney [name] MRN: [id number] DOB: [birth year]   Cancelled Treatment:    Reason Eval/Treat Not Completed: Patient at procedure or test/unavailable (Patient currently receiving bedside dialysis; unavailable for participation with session at this time.  Will re-attempt at later time/date as appropriate.)   Courtney Mcgregory H. Manson PasseyBrown, PT, DPT, NCS 09/07/2015, 10:22 AM [phone]

## 2015-09-07 NOTE — Progress Notes (Signed)
   09/07/15 1245  Clinical Encounter Type  Visited With Patient  Visit Type Critical Care  Referral From Nurse  Consult/Referral To Chaplain  Spiritual Encounters  Spiritual Needs Prayer  Stress Factors  Patient Stress Factors Exhausted;Health changes  Prepared to follow up with patient on referral from ICU Chaplain. Medical care team alerted a Rapid Response due to drop in BP. I provided spiritual presence & prayer. Pt. began to respond to med staff. Nurse Supervisor advised me that the pt will be moved to ICU.  Chap. Swayzee Wadley G. Thurl Boen, ext. [phone]  Chap. Zyair Russi G. Makala Fetterolf, ext. [phone]

## 2015-09-08 LAB — GLUCOSE, CAPILLARY
GLUCOSE-CAPILLARY: 109 mg/dL — AB (ref 65–99)
GLUCOSE-CAPILLARY: 98 mg/dL (ref 65–99)
GLUCOSE-CAPILLARY: 112 mg/dL — AB (ref 65–99)
GLUCOSE-CAPILLARY: 118 mg/dL — AB (ref 65–99)
Glucose-Capillary: 123 mg/dL — ABNORMAL HIGH (ref 65–99)

## 2015-09-08 LAB — BASIC METABOLIC PANEL
Anion gap: 6 (ref 5–15)
BUN: 55 mg/dL — AB (ref 6–20)
CALCIUM: 7.5 mg/dL — AB (ref 8.9–10.3)
CO2: 32 mmol/L (ref 22–32)
Chloride: 98 mmol/L — ABNORMAL LOW (ref 101–111)
Creatinine, Ser: 1.31 mg/dL — ABNORMAL HIGH (ref 0.44–1.00)
GFR, EST AFRICAN AMERICAN: 54 mL/min — AB (ref >60)
GFR, EST NON AFRICAN AMERICAN: 47 mL/min — AB (ref >60)
Glucose, Bld: 121 mg/dL — ABNORMAL HIGH (ref 65–99)
Potassium: 3.5 mmol/L (ref 3.5–5.1)
SODIUM: 136 mmol/L (ref 135–145)

## 2015-09-08 MED ORDER — MORPHINE SULFATE (PF) 2 MG/ML IV SOLN
INTRAVENOUS | Status: AC
  Administered 2015-09-08: 2 mg via INTRAVENOUS
  Filled 2015-09-08: qty 1

## 2015-09-08 MED ORDER — MEGESTROL ACETATE 400 MG/10ML PO SUSP
400.0000 mg | Freq: Every day | ORAL | Status: DC
Start: 2015-09-09 — End: 2015-09-15
  Administered 2015-09-09 – 2015-09-15 (×7): 400 mg
  Filled 2015-09-08 (×7): qty 10

## 2015-09-08 MED ORDER — MORPHINE SULFATE (PF) 2 MG/ML IV SOLN
2.0000 mg | INTRAVENOUS | Status: DC | PRN
  Administered 2015-09-08 – 2015-09-28 (×24): 2 mg via INTRAVENOUS
  Filled 2015-09-08 (×25): qty 1

## 2015-09-08 MED ORDER — PRO-STAT SUGAR FREE PO LIQD
30.0000 mL | ORAL | Status: DC
  Administered 2015-09-08 – 2015-09-13 (×28): 30 mL
  Administered 2015-09-13: 07:00:00
  Administered 2015-09-13 – 2015-09-16 (×18): 30 mL

## 2015-09-08 MED ORDER — MEGESTROL ACETATE 400 MG/10ML PO SUSP
200.0000 mg | Freq: Once | ORAL | Status: AC
Start: 2015-09-08 — End: 2015-09-08
  Administered 2015-09-08: 200 mg
  Filled 2015-09-08: qty 5

## 2015-09-08 NOTE — Care Management (Addendum)
Patient was transferred back to icu for hypotension that occurred during dialysis. Up to that point, the plan was to continue to schedule conference call with team members to include- Dr Judithann SheenSparks or Dr Jacky KindleAronson, CM, nephrology, Speech, nutritionist, and intensivist .  Discussed with Francella Sol[facility]Alicia White at [facility] the possibility of [facility] medical director participating to address coverage issues in the event husband blocks progression of patient's plan of care.  Discussed the calorie count needs to be very specific in nature- should not address the percentage of meal taken orally but to include the exact amount of item- by the teaspoon if necessary.  There is concern that with the previous bypass surgery - continued infusion of tube feeding could affect calorie count because patient may feel full.  Discussed that staff must be diligent in the attempts at feeding which could be time consuming but dire to the plan of care because need to set goals for nutrition. The fact that at present time, patient is not able to sit for dialysis is being addressed as when patient is medically stable for the next level of care- most probable need for stretcher dialysis.  There are currently pending consults: one for surgery- to readdress sacral decubitus debridement and cardiology to determine need for TEE.

## 2015-09-08 NOTE — Care Management (Signed)
Left message for Dr Jacky KindleAronson at [phone] to inquire about participation in a conference call team meeting.  Have updated icu CM

## 2015-09-08 NOTE — Progress Notes (Signed)
PT Cancellation Note  Patient Details Name: Courtney GrinderHedda McMillian [name] MRN: [id number] DOB: October 05, 1966   Cancelled Treatment:    Reason Eval/Treat Not Completed: Medical issues which prohibited therapy (Per chart review, patient transferred to CCU secondary to hypotension (requiring pressors, now off).  Per policy, will require new orders to continue PT services due to change in status and transfer to higher level of care.  RN informed/aware and to enter as appropriate.)    Letasha Kershaw H. Manson PasseyBrown, PT, DPT, NCS 09/08/2015, 11:13 AM [phone]

## 2015-09-08 NOTE — Progress Notes (Signed)
Subjective:  Patient remains critically ill.  Off levophed at present     Objective:  Vital signs in last 24 hours:  Temp:  [98.5 F (36.9 C)-98.7 F (37.1 C)] 98.7 F (37.1 C) (10/20 0400) Pulse Rate:  [95-134] 103 (10/20 0500) Resp:  [0-34] 12 (10/20 0500) BP: (57-145)/(22-101) 111/63 mmHg (10/20 0500) SpO2:  [97 %-100 %] 100 % (10/20 0500) FiO2 (%):  [40 %] 40 % (10/20 0429) Weight:  [89.3 kg (196 lb 13.9 oz)] 89.3 kg (196 lb 13.9 oz) (10/19 1033)  Weight change: -0.059 kg (-2.1 oz) Filed Weights   08/31/15 0558 09/07/15 0508 09/07/15 1033  Weight: 84 kg (185 lb 3 oz) 89.359 kg (197 lb) 89.3 kg (196 lb 13.9 oz)    Intake/Output: I/O last 3 completed shifts: In: 1255.8 [I.V.:495.8; NG/GT:660; IV Piggyback:100] Out: 0    Intake/Output this shift:     Physical Exam: General: No acute distress  Head: +NGT   Eyes: anicteric  Neck: Trach in place  Lungs:  Scattered rhonchi, normal effort  Heart: S1S2 no rubs  Abdomen:  Soft, nontender,   Extremities:  +right upper extremity edema, trace edema of left upper extremity    Neurologic: Opens eyes to commands, did not interact much  Skin:   Access: R IJ permcath    Basic Metabolic Panel:  Recent Labs Lab 09/02/15 0636 09/04/15 0351 09/05/15 0450  NA 143 140 141  K 3.4* 4.1 4.4  CL 106 104 103  CO2 34* 32 33*  GLUCOSE 139* 89 65  BUN 38* 38* 53*  CREATININE 0.82 0.87 1.25*  CALCIUM 7.8* 7.7* 7.9*    Liver Function Tests: No results for input(s): AST, ALT, ALKPHOS, BILITOT, PROT, ALBUMIN in the last 168 hours. No results for input(s): LIPASE, AMYLASE in the last 168 hours. No results for input(s): AMMONIA in the last 168 hours.  CBC:  Recent Labs Lab 09/02/15 0636 09/04/15 0351 09/05/15 0450  WBC 12.9* 11.5* 11.1*  HGB 8.5* 7.9* 8.1*  HCT 27.5* 25.1* 25.2*  MCV 96.0 96.4 95.8  PLT 162 183 198    Cardiac Enzymes: No results for input(s): CKTOTAL, CKMB, CKMBINDEX, TROPONINI in the last 168  hours.  BNP: Invalid input(s): POCBNP  CBG:  Recent Labs Lab 09/06/15 1638 09/07/15 0020 09/07/15 1615 09/08/15 0022 09/08/15 0747  GLUCAP 103* 144* 83 123* 109*    Microbiology: Results for orders placed or performed during the hospital encounter of 09/07/15  Blood Culture (routine x 2)     Status: None   Collection Time: 09/07/15  8:51 AM  Result Value Ref Range Status   Specimen Description BLOOD [name]  Final   Special Requests BOTTLES DRAWN AEROBIC AND ANAEROBIC  3CC  Final   Culture NO GROWTH 5 DAYS  Final   Report Status 08/17/2015 FINAL  Final  Blood Culture (routine x 2)     Status: None   Collection Time: 09/07/2015  9:20 AM  Result Value Ref Range Status   Specimen Description BLOOD LEFT ARM  Final   Special Requests BOTTLES DRAWN AEROBIC AND ANAEROBIC  1CC  Final   Culture NO GROWTH 5 DAYS  Final   Report Status 08/17/2015 FINAL  Final  Wound culture     Status: None   Collection Time: 07-Sep-2015  9:20 AM  Result Value Ref Range Status   Specimen Description DECUBITIS  Final   Special Requests Normal  Final   Gram Stain   Final    FEW WBC SEEN  MANY GRAM NEGATIVE RODS RARE GRAM POSITIVE COCCI    Culture   Final    HEAVY GROWTH ESCHERICHIA COLI MODERATE GROWTH ENTEROBACTER AEROGENES PROTEUS MIRABILIS HEAVY GROWTH ENTEROCOCCUS SPECIES VRE HAVE INTRINSIC RESISTANCE TO MOST COMMONLY USED ANTIBIOTICS AND THE ABILITY TO ACQUIRE RESISTANCE TO MOST AVAILABLE ANTIBIOTICS.    Report Status 08/16/2015 FINAL  Final   Organism ID, Bacteria ESCHERICHIA COLI  Final   Organism ID, Bacteria ENTEROBACTER AEROGENES  Final   Organism ID, Bacteria PROTEUS MIRABILIS  Final   Organism ID, Bacteria ENTEROCOCCUS SPECIES  Final      Susceptibility   Enterobacter aerogenes - MIC*    CEFTAZIDIME <=1 SENSITIVE Sensitive     CEFAZOLIN >=64 RESISTANT Resistant     CEFTRIAXONE <=1 SENSITIVE Sensitive     CIPROFLOXACIN <=0.25 SENSITIVE Sensitive     GENTAMICIN <=1 SENSITIVE Sensitive      IMIPENEM 1 SENSITIVE Sensitive     TRIMETH/SULFA <=20 SENSITIVE Sensitive     * MODERATE GROWTH ENTEROBACTER AEROGENES   Escherichia coli - MIC*    AMPICILLIN <=2 SENSITIVE Sensitive     CEFTAZIDIME <=1 SENSITIVE Sensitive     CEFAZOLIN <=4 SENSITIVE Sensitive     CEFTRIAXONE <=1 SENSITIVE Sensitive     CIPROFLOXACIN <=0.25 SENSITIVE Sensitive     GENTAMICIN <=1 SENSITIVE Sensitive     IMIPENEM <=0.25 SENSITIVE Sensitive     TRIMETH/SULFA <=20 SENSITIVE Sensitive     * HEAVY GROWTH ESCHERICHIA COLI   Proteus mirabilis - MIC*    AMPICILLIN >=32 RESISTANT Resistant     CEFTAZIDIME <=1 SENSITIVE Sensitive     CEFAZOLIN 8 SENSITIVE Sensitive     CEFTRIAXONE <=1 SENSITIVE Sensitive     CIPROFLOXACIN <=0.25 SENSITIVE Sensitive     GENTAMICIN <=1 SENSITIVE Sensitive     IMIPENEM 1 SENSITIVE Sensitive     TRIMETH/SULFA <=20 SENSITIVE Sensitive     * PROTEUS MIRABILIS   Enterococcus species - MIC*    AMPICILLIN >=32 RESISTANT Resistant     VANCOMYCIN >=32 RESISTANT Resistant     GENTAMICIN SYNERGY SENSITIVE Sensitive     TETRACYCLINE Value in next row Resistant      RESISTANT>=16    * HEAVY GROWTH ENTEROCOCCUS SPECIES  MRSA PCR Screening     Status: None   Collection Time: 07/25/2015  2:38 PM  Result Value Ref Range Status   MRSA by PCR NEGATIVE NEGATIVE Final    Comment:        The GeneXpert MRSA Assay (FDA approved for NASAL specimens only), is one component of a comprehensive MRSA colonization surveillance program. It is not intended to diagnose MRSA infection nor to guide or monitor treatment for MRSA infections.   Blood culture (single)     Status: None   Collection Time: 08/09/2015  3:36 PM  Result Value Ref Range Status   Specimen Description BLOOD RIGHT ASSIST CONTROL  Final   Special Requests BOTTLES DRAWN AEROBIC AND ANAEROBIC 5ML  Final   Culture NO GROWTH 5 DAYS  Final   Report Status 08/17/2015 FINAL  Final  Wound culture     Status: None   Collection Time:  08/13/15 12:37 PM  Result Value Ref Range Status   Specimen Description WOUND  Final   Special Requests Normal  Final   Gram Stain   Final    FEW WBC SEEN TOO NUMEROUS TO COUNT GRAM NEGATIVE RODS FEW GRAM POSITIVE COCCI    Culture   Final    HEAVY GROWTH ESCHERICHIA COLI MODERATE  GROWTH PROTEUS MIRABILIS LIGHT GROWTH KLEBSIELLA PNEUMONIAE MODERATE GROWTH ENTEROCOCCUS GALLINARUM CRITICAL RESULT CALLED TO, READ BACK BY AND VERIFIED WITH: [facility] BORBA AT 1042 08/16/15 DV    Report Status 08/17/2015 FINAL  Final   Organism ID, Bacteria ESCHERICHIA COLI  Final   Organism ID, Bacteria PROTEUS MIRABILIS  Final   Organism ID, Bacteria KLEBSIELLA PNEUMONIAE  Final   Organism ID, Bacteria ENTEROCOCCUS GALLINARUM  Final      Susceptibility   Escherichia coli - MIC*    AMPICILLIN >=32 RESISTANT Resistant     CEFTAZIDIME 4 RESISTANT Resistant     CEFAZOLIN >=64 RESISTANT Resistant     CEFTRIAXONE 16 RESISTANT Resistant     GENTAMICIN 2 SENSITIVE Sensitive     IMIPENEM >=16 RESISTANT Resistant     TRIMETH/SULFA <=20 SENSITIVE Sensitive     Extended ESBL POSITIVE Resistant     PIP/TAZO Value in next row Resistant      RESISTANT>=128    CIPROFLOXACIN Value in next row Sensitive      SENSITIVE<=0.25    * HEAVY GROWTH ESCHERICHIA COLI   Klebsiella pneumoniae - MIC*    AMPICILLIN Value in next row Resistant      SENSITIVE<=0.25    CEFTAZIDIME Value in next row Resistant      SENSITIVE<=0.25    CEFAZOLIN Value in next row Resistant      SENSITIVE<=0.25    CEFTRIAXONE Value in next row Resistant      SENSITIVE<=0.25    CIPROFLOXACIN Value in next row Resistant      SENSITIVE<=0.25    GENTAMICIN Value in next row Sensitive      SENSITIVE<=0.25    IMIPENEM Value in next row Resistant      SENSITIVE<=0.25    TRIMETH/SULFA Value in next row Resistant      SENSITIVE<=0.25    PIP/TAZO Value in next row Resistant      RESISTANT>=128    * LIGHT GROWTH KLEBSIELLA PNEUMONIAE   Proteus  mirabilis - MIC*    AMPICILLIN Value in next row Resistant      RESISTANT>=128    CEFTAZIDIME Value in next row Sensitive      RESISTANT>=128    CEFAZOLIN Value in next row Sensitive      RESISTANT>=128    CEFTRIAXONE Value in next row Sensitive      RESISTANT>=128    CIPROFLOXACIN Value in next row Sensitive      RESISTANT>=128    GENTAMICIN Value in next row Sensitive      RESISTANT>=128    IMIPENEM Value in next row Sensitive      RESISTANT>=128    TRIMETH/SULFA Value in next row Sensitive      RESISTANT>=128    PIP/TAZO Value in next row Sensitive      SENSITIVE<=4    * MODERATE GROWTH PROTEUS MIRABILIS   Enterococcus gallinarum - MIC*    AMPICILLIN Value in next row Resistant      SENSITIVE<=4    GENTAMICIN SYNERGY Value in next row Sensitive      SENSITIVE<=4    CIPROFLOXACIN Value in next row Resistant      RESISTANT>=8    TETRACYCLINE Value in next row Resistant      RESISTANT>=16    * MODERATE GROWTH ENTEROCOCCUS GALLINARUM  Wound culture     Status: None   Collection Time: 08/15/15  2:53 PM  Result Value Ref Range Status   Specimen Description WOUND  Final   Special Requests NONE  Final   Gram  Stain FEW WBC SEEN NO ORGANISMS SEEN   Final   Culture NO GROWTH 3 DAYS  Final   Report Status 08/18/2015 FINAL  Final  C difficile quick scan w PCR reflex     Status: None   Collection Time: 08/17/15 11:34 AM  Result Value Ref Range Status   C Diff antigen NEGATIVE NEGATIVE Final   C Diff toxin NEGATIVE NEGATIVE Final   C Diff interpretation Negative for C. difficile  Final  Stool culture     Status: None   Collection Time: 09/03/15  3:51 PM  Result Value Ref Range Status   Specimen Description STOOL  Final   Special Requests Immunocompromised  Final   Culture   Final    NO SALMONELLA OR SHIGELLA ISOLATED No Pathogenic E. coli detected NO CAMPYLOBACTER DETECTED    Report Status 09/07/2015 FINAL  Final    Coagulation Studies: No results for input(s):  LABPROT, INR in the last 72 hours.  Urinalysis: No results for input(s): COLORURINE, LABSPEC, PHURINE, GLUCOSEU, HGBUR, BILIRUBINUR, KETONESUR, PROTEINUR, UROBILINOGEN, NITRITE, LEUKOCYTESUR in the last 72 hours.  Invalid input(s): APPERANCEUR    Imaging: Dg Chest Port 1 View  09/07/2015  CLINICAL DATA:  Aspiration pneumonia EXAM: PORTABLE CHEST 1 VIEW COMPARISON:  Five days ago FINDINGS: Dialysis catheter from the right with tip stable at the upper right atrium. Tracheostomy tube and left IJ central line have a stable position. Feeding tube enters the stomach at least. New bibasilar opacity. No edema, effusion, or air leak. Chronic mild cardiomegaly. IMPRESSION: 1. Bibasilar atelectasis or pneumonia that is new from 09/02/2015. 2. Unchanged positioning of tubes and lines. Electronically Signed   By: Marnee Spring M.D.   On: 09/07/2015 14:02     Medications:   . norepinephrine 0 mcg/min (09/08/15 0442)   . acidophilus  1 capsule Oral Daily  . amantadine  200 mg Oral Q7 days  . antiseptic oral rinse  7 mL Mouth Rinse q12n4p  . chlorhexidine  15 mL Mouth Rinse BID  . clindamycin (CLEOCIN) IV  600 mg Intravenous 3 times per day  . [START ON 09/09/2015] epoetin (EPOGEN/PROCRIT) injection  10,000 Units Intravenous Q M,W,F-HD  . feeding supplement (JEVITY 1.2 CAL)  1,000 mL Per Tube Q24H  . feeding supplement (PRO-STAT SUGAR FREE 64)  30 mL Oral 6 times per day  . free water  200 mL Per Tube 3 times per day  . heparin subcutaneous  5,000 Units Subcutaneous Q12H  . hydrocortisone  10 mg Per Tube BID  . lidocaine  1 patch Transdermal Q24H  . megestrol  200 mg Per Tube Daily  . midodrine  10 mg Oral TID WC  . multivitamin  5 mL Per Tube Daily  . nystatin   Topical TID  . pantoprazole sodium  40 mg Per Tube Q1200  . sodium chloride  10-40 mL Intracatheter Q12H  . sodium hypochlorite   Irrigation BID   sodium chloride, sodium chloride, sodium chloride, sodium chloride, acetaminophen  (TYLENOL) oral liquid 160 mg/5 mL, alteplase, alteplase, anticoagulant sodium citrate, ipratropium-albuterol, lidocaine (PF), lidocaine-prilocaine, loperamide, oxyCODONE-acetaminophen, pentafluoroprop-tetrafluoroeth, sodium chloride  Assessment/ Plan:  49 y.o. 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 End-stage renal disease on hemodialysis on HD MWF. The patient has been on dialysis since October of 2014. R IJ permcath.  -  Patient did not tolerate dialysis today due to intradialytic hypotension which did not respond to usual measures  - She is currently being monitored in ICU - will attempt IHD tomorrow. Appreciate cardiology eval suggesting LVH and small LV cavity. Will need to keep dialysis BFR slow  2. Anemia of CKD: status post PRBC transfusions. Hgb down to 8.1 - continue epogen IV with HD.   3. Secondary hyperparathyroidism: PTH low at 55,  . - no indication for binders or activated vitamin D at this point    4. Hypotension: required vasopressors earlier in admission. - continue midodrine 10mg  po tid.  We have also been giving PRN albumin to keep BP up during HD, but despite this UF has been limited by hypotension.  5.  Dispo:  Unclear at this time. Has to be able to sit in chair for the duration of treatment prior to outpatient discharge Otherwise may need to look into stretcher dialysis facilities   LOS:  27 Luisenrique Conran 10/20/20168:40 AM

## 2015-09-08 NOTE — Evaluation (Signed)
Physical Therapy Re-Evaluation Patient Details Name: Courtney GrinderHedda McMillian Monroe MRN: [id number] DOB: Sep 19, 1966 Today's Date: 09/08/2015   History of Present Illness  Pt is a [age] F who has been in medical facilities (hosp, LTAC, rehab) for 2 yrs following gastric bypass surgery with multiple complications. Now with chronic trach, ESRD, profound debilitation, severe sacral pressure ulcer (stage IV). Was seemingly making progress and transferred to rehab facility approx one week prior to this admission. [facility] with AMS and hypotension. Admitted with sepsis related to infected sacral ulcer.  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  Patient transferred to CCU to med-surg floor during hospitalization, but experienced profound interdialytic hypotension requiring return transfer to CCU for use of pressors (now off).  New orders received to resume PT; cleared by RN for participation as tolerated.  Clinical Impression  Patient sleeping upon arrival to session, but arousable to voice/light touch by therapist.  Initially only briefly opens eyes and closes again (as if avoiding therapist), but opens and tracks/follows therapist as session progresses.  Patient intermittently (and appropriately) nodding yes/no during session, but with no spontaneous attempts at mouthing words or verbalization/communcation otherwise.  Demonstrates very minimal active movement of L UE (2-/5 elbow flexion, forearm supination and gross grasp/release); otherwise, all extremities appear generally flaccid without noted active movement.  R LE movement limited by pain (indicated by facial grimacing), but patient unable to describe/localize.  Question overall sensory awareness R > L hemi-body; minimal/no response to light touch, pain in R UE/LE. Currently dependent for all  mobility; patient with limited active effort to assist with any functional activities at this time (beyond movement of L UE).  Will continue efforts as patient appropriate and able to tolerate/participate. Would benefit from skilled PT to address above deficits and promote optimal return to PLOF; [facility] discharge from acute hospitalization.  Anticipate prolonged recovery/rehabilitation course.     Follow Up Recommendations LTACH;SNF    Equipment Recommendations       Recommendations for Other Services       Precautions / Restrictions Precautions Precautions: Fall Precaution Comments: stage 4 sacral ulcer, NG tube/NPO, trach, contact isolation, R IJ permcath, L IJ central line Restrictions Weight Bearing Restrictions: No      Mobility  Bed Mobility Overal bed mobility: Needs Assistance             General bed mobility comments: absent attempts to assist with any functional mobility/activity; dep for all mobility efforts at this time  Transfers                    Ambulation/Gait                Stairs            Wheelchair Mobility    Modified Rankin (Stroke Patients Only)       Balance                                             Pertinent Vitals/Pain Pain Assessment: Faces Faces Pain Scale: Hurts even more Pain Location: R > L LE with passive hip flexion Pain Descriptors / Indicators: Grimacing Pain Intervention(s): Limited activity within patient's tolerance;Monitored during session;Repositioned    Home Living  Family/patient expects to be discharged to:: Skilled nursing facility                 Additional Comments: patient unable to provide information regarding PLOF or level of participation/function prior to hospitalization    Prior Function Level of Independence: Needs assistance         Comments: patient unable to provide information regarding PLOF or level of  participation/function prior to hospitalization; will verify from family/resources as appropriate     Hand Dominance        Extremity/Trunk Assessment   Upper Extremity Assessment:  (L UE globally weak, flaccid except for 2-/5 elbow flexion, forearm supination and gross grasp/release; R UE globally flaccid, question sensory awareness (?absent sensation?).  R > L UE edematous)           Lower Extremity Assessment:  (patient demonstrating absent active movement of bilat LEs this date; passive bilat ankle DF to neutral.  Marked facial grimacing to movement of R LE, limited tolerance for passive ROM.  Question sensory awareness of R LE (?absent sensation?))      Cervical / Trunk Assessment: Normal (maintaining head in neutral position this date)  Communication   Communication: Tracheostomy (patient intermittently (appropriately) nodding yes/no throughout session)  Cognition Arousal/Alertness: Lethargic Behavior During Therapy: Flat affect Overall Cognitive Status: Difficult to assess (limited interaction with therapist; correctly nodding yes/no during session)                      General Comments General comments (skin integrity, edema, etc.): stage IV sacral ulcer, R hip ulceration, R lateral forefoot ulceration, (all non-visualized during evaluation); scattered scabbed areas to toes (R > L)    Exercises Other Exercises Other Exercises: Act assist L UE: gross grasp/release, forearm supination/pronation, elbow flex/ext; passive shoulder IR/ERflex/ext/abduct/adduct.  Passive ROM R UE: gross grasp/release, forearm pronation/supination, elbow flex/ext, shoulder IR/ER/flex/ext/abduct/adduct, 1x10 each. Other Exercises: Passive L LE, 1x10: ankle PF/DF, hip/knee flex/ext, hip abduct/adduct (tolerating L hip flexion to approx 45-50 degrees).  Passive R LE: ankle PF/DF, 1x10; hip/knee flex/ext and hip abduct/adduct, 1x2-3.  Limited tolerance for movement of R LE due to pain (indicated  by facial grimacing), tolerating only approx 10 degrees abduct and 15-20 degrees of hip flexion; activity terminated and patient repositioned to comfort.      Assessment/Plan    PT Assessment Patient needs continued PT services  PT Diagnosis Difficulty walking;Generalized weakness;Acute pain;Hemiplegia non-dominant side;Hemiplegia dominant side   PT Problem List Decreased strength;Decreased range of motion;Decreased activity tolerance;Decreased balance;Decreased mobility;Decreased coordination;Decreased cognition;Decreased knowledge of use of DME;Decreased safety awareness;Decreased knowledge of precautions;Cardiopulmonary status limiting activity;Impaired sensation;Decreased skin integrity;Pain  PT Treatment Interventions Therapeutic exercise;Manual techniques;Patient/family education;Therapeutic activities;Functional mobility training;Balance training;Neuromuscular re-education;Cognitive remediation   PT Goals (Current goals can be found in the Care Plan section) Acute Rehab PT Goals Patient Stated Goal: patient unable to verbalize PT Goal Formulation: Patient unable to participate in goal setting Time For Goal Achievement: 09/22/15 Potential to Achieve Goals: Fair    Frequency Min 2X/week   Barriers to discharge Decreased caregiver support      Co-evaluation               End of Session   Activity Tolerance:  (improved participation compared to previous sessions) Patient left: in bed;with call bell/phone within reach Nurse Communication:  (participation/performance during session)         Time: 1610-9604 PT Time Calculation (min) (ACUTE ONLY): 18 min   Charges:   PT Evaluation $  PT Re-evaluation: 1 Procedure PT Treatments $Therapeutic Exercise: 8-22 mins   PT G Codes:        Justis Dupas H. Manson Passey, PT, DPT, NCS 09/08/2015, 4:11 PM [phone]

## 2015-09-08 NOTE — Care Management Note (Signed)
Case Management Note  Patient Details  Name: Courtney [name] MRN: [id number] Date of Birth: [birth year]  Subjective/Objective:    Chat reviewed. Will continue progression indicated by previous RNCM                Action/Plan:   Expected Discharge Date:                  Expected Discharge Plan:  Long Term Acute Care (LTAC)  In-House Referral:     Discharge planning Services  CM Consult  Post Acute Care Choice:    Choice offered to:     DME Arranged:    DME Agency:     HH Arranged:    HH Agency:     Status of Service:  In process, will continue to follow  Medicare Important Message Given:    Date Medicare IM Given:    Medicare IM give by:    Date Additional Medicare IM Given:    Additional Medicare Important Message give by:     If discussed at Long Length of Stay Meetings, dates discussed:    Additional Comments:  Courtney MemosLisa M [name], RN 09/08/2015, 8:41 AM

## 2015-09-08 NOTE — Care Management (Signed)
During [facility]OS meeting discussed the need for the following: 1. Medical Stability:  treatment and resolution of diarrhea; nutrition; the best possible treatment for the poor nutrition may be tpn as tube feedings and small amounts of pureed diet will not maintain;  stability of vital signs during dialysis.   2. Identify consistent team members and identify a lead physician. 3. It has been assumed that patient is not competent but there is no documentation that this has been established. Obtain psych consult for assessment to identify whether patient  can take part in medical decisions for her care and treatment plan.  This order has been obtained. 4.Develop strategies that allow treatments/orders to be geared toward medical progression of the patient and the best care for patient rather than have orders and care that is directed by family member. 5. Arrange for meeting of care team that will consistently follow patient.

## 2015-09-08 NOTE — Progress Notes (Signed)
OT Cancellation Note  Patient Details Name: Courtney [name] MRN: [id number] DOB: 10-18-1966   Cancelled Treatment:    Reason Eval/Treat Not Completed: Medical issues which prohibited therapy She has been transferred to ICU and will need new orders.  [name], Burtis Imhoff M 09/08/2015, 11:14 AM

## 2015-09-08 NOTE — Progress Notes (Signed)
Nutrition Follow-up  DOCUMENTATION CODES:   Severe malnutrition in context of acute illness/injury  INTERVENTION:   Coordination of Care: discussed nutritional poc during ICU Rounds with MD Kasa; plan today is to continue current TF at current rate with current Prostat via dobhoff tube at present. Per MD, plan to continue NPO status today as well (calorie count ordered for po intake but no diet order at present, will resume calorie count once diet restarted).  MD is aware that current TF formula at current rate does not meet nutritional needs (1176 kcals,  117 g of protein if no interruptions in continuous feeding and if pt receives all 6 packets of Prostat in 24 hour period).   NUTRITION DIAGNOSIS:   Inadequate oral intake related to wound healing, acute illness, chronic illness as evidenced by NPO status, estimated needs. Continues  GOAL:   Patient will meet greater than or equal to 90% of their needs  MONITOR:    (Energy Intake, Anthropometrics, Digestive System, Electrolyte/Renal Profile, Glucose Profile)   ASSESSMENT:     Pt transferred to ICU yesterday after rapid response called for hypotension during dialysis; off levophed at present, noted plan for TEE at some point  Diet Order:   diet order discontinued yesterday after change in pt status  Energy Intake: reported po intake had been minimal on diet; bites/sips; recorded po intake 5%.   EN: tolerating Jevity 1.2 at rate of 20 ml/hr, Prostat 6 times daily. ]  Digestive System: no signs of TF intolerance, last BM documented 10/19 (+medium yellow BM)  Skin:   (stage IV sacrum, stage III hip, unstageable foot; rectal ulcer from fleixseal, ulcer in nose from dobhoff)   Electrolyte and Renal Profile:  Recent Labs Lab 09/02/15 0636 09/04/15 0351 09/05/15 0450  BUN 38* 38* 53*  CREATININE 0.82 0.87 1.25*  NA 143 140 141  K 3.4* 4.1 4.4   Glucose Profile:   Recent Labs  09/07/15 1615 09/08/15 0022 09/08/15 0747   GLUCAP 83 123* 109*   Nutritional Anemia Profile:  CBC Latest Ref Rng 09/05/2015 09/04/2015 09/02/2015  WBC 3.6 - 11.0 K/uL 11.1(H) 11.5(H) 12.9(H)  Hemoglobin 12.0 - 16.0 g/dL 8.1(L) 7.9(L) 8.5(L)  Hematocrit 35.0 - 47.0 % 25.2(L) 25.1(L) 27.5(L)  Platelets 150 - 440 K/uL 198 183 162    Meds: megace, midorine, imodium prn, MVI, acidophilus  Height:   Ht Readings from Last 1 Encounters:  07-04-15 5\' 9"  (1.753 m)    Weight:   Wt Readings from Last 1 Encounters:  09/08/15 182 lb (82.555 kg)    BMI:  Body mass index is 26.86 kg/(m^2).  Estimated Nutritional Needs:   Kcal:  [id number] kcals (BEE 1509, 1.2 AF, 1.2-1.4 IF) or 2490-2905 kcals (30-35 kcals/kg)   Protein:  125-166 g (1.5-2.0 g/kg) but likely closer to 166-508 g (2.0-2.5 g/kg)   Fluid:  1000 mL plus UOP  HIGH Care Level  Romelle Starcherate Georgette Helmer MS, RD, LDN [phone] Pager

## 2015-09-08 NOTE — Progress Notes (Signed)
[facility] Critical Care Medicine Progess Note  Name: Courtney [name] MRN: [id number] DOB: 09-25-1966    ADMISSION DATE:  07/21/2015 ADMISSION DATE: 07/27/2015  INTERVAL HISTORY:  10/20-transferred to step down for sepsis/aspiration pneumonia, weaned off levophed this AM -abx adjusted, continue TF's, will NOT start oral feeds Plan for TEE at some point, appreciate surgical input, obtain CBC,chem 7, CXR as needed Will update husband today  10/19-patient with minimal amounts of food this AM, will plan to continue oral intake along with Tube feedings and assess calorie count This was discussed with Husband, plan to stop TF's tomorrow if she tolerates oral feeds and remove NG tube in next 2 days Will talk and discuss with Husband that patient is very high risk for aspiration-will obtain Surgery consult to reassess decub ulcer Will consult cardiology to assess for TEE  10/18 discussed with husband-patient passed swallow eval spoke with Speech Therapist Will add Pureed thick nectar foods and continue Enteral feeds through NG tube.  Will add Megace   On 10/16  the patient's husband was upset due to placement of a rectal tube by on-call physician, this was removed. It was also noted that the pt developed diarrhea, therefore Jevity was changed from 1.5 with fiber to Jevity 1.2; the rate was decreased to 20 cc/hr and the pt was given a dose of lomotil, which has helped the diarrhea. It was also sent for stool culture and Cdif.   On 10/17 discussed with husband options going forward. He would like to transfer to [facility] as it is closer to him, also  [facility] is there so he can have his wife evaluated better and that he can have the [employer] come and check on her status personally-I have discussed case with Hospitalist and they pretty much have refused to accept her at this time.   -He declines transferof his wife back to [place].   -He would like to repeat speech evaluation and Pasey-Muir  valve as he hopes that his wife will be able to eat and then will not need a feeding tube. He understands that she may be "stuck" in the hospital as currently no local LTAC is accepting her. He is continuing discussions with [facility].   -He would like his wife restarted on aspirin due to stroke risk. It was explained that given her life threatening bleed I did not feel comfortable restarting this medication at this time, but he could revisit this discussion with other physicians in the future.    INITIAL PRESENTATION:  81 F who has been in medical facilities (hosp, LTAC, rehab) for 2 yrs following gastric bypass surgery with multiple complications. Now with chronic trach, ESRD, profound debilitation, severe sacral pressure ulcer. Was seemingly making progress and transferred to rehab facility approx one week prior to this admission. She was sent to [facility] ED with AMS and hypotension. Working dx of severe sepsis/septic shock due to infected sacral pressure ulcer. Also has R side externalized HD cath and tunneled L IJ CVL. She has history of resistant bacterial infections. She is on chronic steroids. Her husband indicates that she has been diagnosed with adrenal insuff at some point during the past 2 yrs  MAJOR EVENTS/TEST RESULTS: Admission 02/07/14-05/07/14 Admission 07/21/14-09/06/14 Discharged to [facility]. Pt had palliative consult at that time, were asked to sign off by husband.  09/23 CT head: NAD 09/23 EEG: no epileptiform activity 09/23 PRBCs for Hgb 6.4 09/24 bedside debridement of sacral wound. Abscess drained 09/25 Off vasopressors. More alert. No distress.  Worsening thrombocytopenia. Vanc DC'd 09/29  Dr. [name] (I.D) excused from the case by patient's husband. 10/02 MRI -multiple infarcts 10/03 tracheal bleeding- transfused platelets 10/03 hospitalist service excused from the case by patient's husband 10/03 Echocardiogram ejection fraction was 55-60%, pulmonary systolic pressure was 39  mmHg 10/08 restart TF's at lower rate, attempt reg HD  10/12 Transferred to med-surg floor. 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 History of sepsis from C. glabrata MRSA PCR 9/23 >> NEG Urine 9/23 >>  Wound (swab) 9/23 >> multiple organisms Wound (debridement) 9/24 >> Enterococcus, K. Pneumoniae, P. Mirabilis, VRE Wound 9/26 >> No growth Blood 9/23 >> neg CDiff 9/27>>neg  ANTIMICROBIALS:  Aztreonam 9/23 >> 9/24 Vanc 9/23 >> 9/25 Vanc 9/26>>9/27 Daptomycin 9/27>> 10/12 Meropenem 9/23 >> 10/14   SUBJECTIVE:  RASS 0. Nonverbal, does not follow commands, opens eyes to vocal stimuli,  more alert today   VITAL SIGNS: Temp:  [98.5 F (36.9 C)-98.7 F (37.1 C)] 98.7 F (37.1 C) (10/20 0400) Pulse Rate:  [95-134] 103 (10/20 0500) Resp:  [0-34] 12 (10/20 0500) BP: (57-145)/(22-101) 111/63 mmHg (10/20 0500) SpO2:  [97 %-100 %] 100 % (10/20 0500) FiO2 (%):  [40 %] 40 % (10/20 0429) Weight:  [182 lb (82.555 kg)] 182 lb (82.555 kg) (10/20 0900) HEMODYNAMICS:   VENTILATOR SETTINGS: Vent Mode:  [-]  FiO2 (%):  [40 %] 40 % INTAKE / OUTPUT:  Intake/Output Summary (Last 24 hours) at 09/08/15 1041 Last data filed at 09/08/15 0531  Gross per 24 hour  Intake 1009.8 ml  Output      0 ml  Net 1009.8 ml    PHYSICAL EXAMINATION: Physical Examination:   VS: BP 111/63 mmHg  Pulse 103  Temp(Src) 98.7 F (37.1 C) (Axillary)  Resp 12  Ht 5\' 9"   (1.753 m)  Wt 182 lb (82.555 kg)  BMI 26.86 kg/m2  SpO2 100%  General Appearance: No resp distress  Neuro: profoundly diffusely weak HEENT: cushingoid facies, PERRLA, EOM intact Neck: trach site clean Pulmonary: clear anteriorly Cardiovascular: reg, no M  Abdomen: soft, NT, +BS Extremities: warm, R>L UE edema    LABORATORY PANEL:   CBC  Recent Labs Lab 09/05/15 0450  WBC 11.1*  HGB 8.1*  HCT 25.2*  PLT 198    Chemistries   Recent Labs Lab 09/05/15 0450  NA 141  K 4.4  CL 103  CO2 33*  GLUCOSE 65  BUN 53*  CREATININE 1.25*  CALCIUM 7.9*     CXR: nnf   IMPRESSION/PLAN: PULMONARY A: 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 Diff Stool incontinence Dysphagia P:  Cont PPI Avoid anticoagulants Plan for PM valve daily for several hours at a time  HEMATOLOGIC A:  Acute on chronic anemia Thrombocytopenia - HIT panel negative 9/25 P:  DVT px: SCDs, resume SQ heparin 10/14 Monitor CBC intermittently Transfuse per usual guidelines plt count 198 10/17  INFECTIOUS A:  Severe sepsis, resolved Sacral decubitus ulcer with abscess P:  Monitor temp, WBC count Micro and abx as above-off abx now  ENDOCRINE A:  DM 2, controlled. 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  10/02 Profound deconditioning Chronic pain P: RASS goal: 0 Minimize sedating meds Changed analgesics to PRN oxycodone 10/13 Cont PT  Per Neurology, no indication for TEE presently as she would not be a candidate for anticoagulation  SOCIAL/DISPO: [name] has been somewhat difficult and has fired ID and Hospitalist services We have sought transfer to [facility] but she has been turned down by [facility] and [facility] We are seeking return to [facility] but she cannot go there with NGT and husband has been reluctant to consent to G-tube placement Care management very involved  I have personally obtained a history, examined the patient, evaluated Pertinent laboratory and RadioGraphic/imaging results, and  formulated the assessment and plan   The Patient requires high complexity decision making for assessment and support, frequent evaluation and titration of therapies.  Husband satisfied with Plan of action and management. All questions answered  Lucie Leather, M.D.  [facility]  Medical Director [facility] Medical Director [facility] Cardio-Pulmonary Department

## 2015-09-09 LAB — CBC
HEMATOCRIT: 25.5 % — AB (ref 35.0–47.0)
HEMOGLOBIN: 8.1 g/dL — AB (ref 12.0–16.0)
MCH: 30.6 pg (ref 26.0–34.0)
MCHC: 31.8 g/dL — AB (ref 32.0–36.0)
MCV: 96.4 fL (ref 80.0–100.0)
Platelets: 206 10*3/uL (ref 150–440)
RBC: 2.65 MIL/uL — ABNORMAL LOW (ref 3.80–5.20)
RDW: 21.7 % — AB (ref 11.5–14.5)
WBC: 14.7 10*3/uL — AB (ref 3.6–11.0)

## 2015-09-09 LAB — GLUCOSE, CAPILLARY
GLUCOSE-CAPILLARY: 86 mg/dL (ref 65–99)
GLUCOSE-CAPILLARY: 91 mg/dL (ref 65–99)
Glucose-Capillary: 118 mg/dL — ABNORMAL HIGH (ref 65–99)
Glucose-Capillary: 92 mg/dL (ref 65–99)
Glucose-Capillary: 94 mg/dL (ref 65–99)

## 2015-09-09 MED ORDER — JEVITY 1.2 CAL PO LIQD
1000.0000 mL | ORAL | Status: DC
  Administered 2015-09-10: 1000 mL
  Administered 2015-09-10: 30 mL/h
  Administered 2015-09-11: 1000 mL
  Filled 2015-09-09: qty 1000

## 2015-09-09 MED ORDER — EPOETIN ALFA 40000 UNIT/ML IJ SOLN: 30000.0000 [IU] | INTRAMUSCULAR | Status: DC

## 2015-09-09 MED ORDER — EPOETIN ALFA 10000 UNIT/ML IJ SOLN
10000.0000 [IU] | INTRAMUSCULAR | Status: DC
  Administered 2015-09-09 – 2015-12-05 (×38): 10000 [IU] via INTRAVENOUS
  Filled 2015-09-09 (×19): qty 1

## 2015-09-09 MED ORDER — MORPHINE SULFATE (PF) 2 MG/ML IV SOLN
2.0000 mg | Freq: Once | INTRAVENOUS | Status: AC
  Administered 2015-09-09: 2 mg via INTRAVENOUS

## 2015-09-09 NOTE — Progress Notes (Signed)
HD tx start 

## 2015-09-09 NOTE — Consult Note (Signed)
  Pt was seen in ICU 19. S Pt is a 49 yr old female with S/P Gastric by pass surgery and has not recovered from the same.  Pt lives with her husband who has been helping her in taking decisions. Pt is not able to speak verbally. Most of the information is obtd from staff. Pt can just move her head up and down "nodd". O.  Pt is seen laying in bed Alert and appears to understand. No agitation. Affect is appropriate to her mood which is low and down. No psychosis. Denies a/v hallucinations. Denies s/h ideas or plans. Admits that she gets along with her husband. And agrees that he takes good decisions for her. I/J guarded. 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 Recommend Continue current treatment  And add a low dose of anti-depresent meds viz Prozac 10 mgs po daily. Pt is not able to take decisions at this time and does need guardianship so that appropriate decision taking can be done in her benefit.

## 2015-09-09 NOTE — Progress Notes (Signed)
[facility] Progess Note  Name: Courtney [name] MRN: [id number] DOB: 1966/10/28    ADMISSION DATE:  2015-09-11 ADMISSION DATE: 09-11-2015  INTERVAL HISTORY:  10/21-off vasopressors s/p wound debridement at bedside by gen surgery Morphine given for severe pain, plan for slow HD today and assess BP Continue step down monitoring, plan for TEE next week will discuss with Husband  10/20-transferred to step down for sepsis/aspiration pneumonia, weaned off levophed this AM -abx adjusted, continue TF's, will NOT start oral feeds Plan for TEE at some point, appreciate surgical input, obtain CBC,chem 7, CXR as needed Will update husband today  10/19-patient with minimal amounts of food this AM, will plan to continue oral intake along with Tube feedings and assess calorie count This was discussed with Husband, plan to stop TF's tomorrow if she tolerates oral feeds and remove NG tube in next 2 days Will talk and discuss with Husband that patient is very high risk for aspiration-will obtain Surgery consult to reassess decub ulcer Will consult cardiology to assess for TEE  10/18 discussed with husband-patient passed swallow eval spoke with Speech Therapist Will add Pureed thick nectar foods and continue Enteral feeds through NG tube.  Will add Megace   On 10/16  the patient's husband was upset due to placement of a rectal tube by on-call physician, this was removed. It was also noted that the pt developed diarrhea, therefore Jevity was changed from 1.5 with fiber to Jevity 1.2; the rate was decreased to 20 cc/hr and the pt was given a dose of lomotil, which has helped the diarrhea. It was also sent for stool culture and Cdif.   On 10/17 discussed with husband options going forward. He would like to transfer to [facility] as it is closer to him, also  [facility] is there so he can have his wife evaluated better and that he can have the [employer] come and check on her status  personally-I have discussed case with Hospitalist and they pretty much have refused to accept her at this time.   -He declines transferof his wife back to [place].   -He would like to repeat speech evaluation and Pasey-Muir valve as he hopes that his wife will be able to eat and then will not need a feeding tube. He understands that she may be "stuck" in the hospital as currently no local LTAC is accepting her. He is continuing discussions with [facility].   -He would like his wife restarted on aspirin due to stroke risk. It was explained that given her life threatening bleed I did not feel comfortable restarting this medication at this time, but he could revisit this discussion with other physicians in the future.    INITIAL PRESENTATION:  75 F who has been in medical facilities (hosp, LTAC, rehab) for 2 yrs following gastric bypass surgery with multiple complications. Now with chronic trach, ESRD, profound debilitation, severe sacral pressure ulcer. Was seemingly making progress and transferred to rehab facility approx one week prior to this admission. She was sent to [facility] ED with AMS and hypotension. Working dx of severe sepsis/septic shock due to infected sacral pressure ulcer. Also has R side externalized HD cath and tunneled L IJ CVL. She has history of resistant bacterial infections. She is on chronic steroids. Her husband indicates that she has been diagnosed with adrenal insuff at some point during the past 2 yrs  MAJOR EVENTS/TEST RESULTS: Admission 02/07/14-05/07/14 Admission 07/21/14-09/06/14 Discharged to [facility]. Pt had palliative consult at that time,  were asked to sign off by husband.  09/23 CT head: NAD 09/23 EEG: no epileptiform activity 09/23 PRBCs for Hgb 6.4 09/24 bedside debridement of sacral wound. Abscess drained 09/25 Off vasopressors. More alert. No distress. Worsening thrombocytopenia. Vanc DC'd 09/29  Dr. [name] (I.D) excused from the case by patient's husband. 10/02 MRI  -multiple infarcts 10/03 tracheal bleeding- transfused platelets 10/03 hospitalist service excused from the case by patient's husband 10/03 Echocardiogram ejection fraction was 55-60%, pulmonary systolic pressure was 39 mmHg 16/1010/08 restart TF's at lower rate, attempt reg HD  10/12 Transferred to med-surg floor. 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 History of sepsis from C. glabrata MRSA PCR 9/23 >> NEG Urine 9/23 >>  Wound (swab) 9/23 >> multiple organisms Wound (debridement) 9/24 >> Enterococcus, K. Pneumoniae, P. Mirabilis, VRE Wound 9/26 >> No growth Blood 9/23 >> neg CDiff 9/27>>neg  ANTIMICROBIALS:  Aztreonam 9/23 >> 9/24 Vanc 9/23 >> 9/25 Vanc 9/26>>9/27 Daptomycin 9/27>> 10/12 Meropenem 9/23 >> 10/14   SUBJECTIVE:  RASS 0. Nonverbal, does not follow commands, opens eyes to vocal stimuli,  more alert today, plan for HD today, follow up gen surgery recs Will plan for TEE next week if husband agrees  VITAL SIGNS: Temp:  [98.1 F (36.7 C)-99.4 F (37.4 C)] 98.1 F (36.7 C) (10/21 0700) Pulse Rate:  [94-113] 101 (10/21 0800) Resp:  [0-27] 22 (10/21 0800) BP: (116-142)/(36-75) 137/56 mmHg (10/21 0800) SpO2:  [87 %-100 %] 100 % (10/21 0847) FiO2 (%):  [28 %-40 %] 40 % (10/21 0847) Weight:  [182 lb (82.555 kg)-185 lb (83.915 kg)] 185 lb (83.915 kg) (10/21 0444) HEMODYNAMICS:   VENTILATOR SETTINGS: Vent Mode:  [-]  FiO2 (%):  [28 %-40  %] 40 % INTAKE / OUTPUT:  Intake/Output Summary (Last 24 hours) at 09/09/15 0855 Last data filed at 09/09/15 0530  Gross per 24 hour  Intake    860 ml  Output      0 ml  Net    860 ml    PHYSICAL EXAMINATION: Physical Examination:   VS: BP 137/56 mmHg  Pulse 101  Temp(Src) 98.1 F (36.7 C) (Axillary)  Resp 22  Ht 5\' 9"  (1.753 m)  Wt 185 lb (83.915 kg)  BMI 27.31 kg/m2  SpO2 100%  General Appearance: No resp distress  Neuro: profoundly diffusely weak HEENT: cushingoid facies, PERRLA, EOM intact Neck: trach site clean Pulmonary: clear anteriorly Cardiovascular: reg, no M  Abdomen: soft, NT, +BS Extremities: warm, R>L UE edema    LABORATORY PANEL:   CBC  Recent Labs Lab 09/09/15 0535  WBC 14.7*  HGB 8.1*  HCT 25.5*  PLT 206    Chemistries   Recent Labs Lab 09/08/15 1945  NA 136  K 3.5  CL 98*  CO2 32  GLUCOSE 121*  BUN 55*  CREATININE 1.31*  CALCIUM 7.5*     CXR: nnf   IMPRESSION/PLAN: PULMONARY A: 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 Diff Stool incontinence Dysphagia P:  Cont PPI Avoid anticoagulants  HEMATOLOGIC A:  Acute on chronic anemia Thrombocytopenia - HIT panel negative 9/25 P:  DVT px: SCDs, resume SQ heparin 10/14 Monitor CBC intermittently Transfuse per usual guidelines plt count 198 10/17 >>  206 on 10/21  INFECTIOUS A:  Severe sepsis, resolved Sacral decubitus ulcer with abscess P:  Monitor temp, WBC count Micro and  abx as above-off abx now  ENDOCRINE A:  DM 2, controlled. 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 Profound deconditioning Chronic pain P: RASS goal: 0 Minimize sedating meds Changed analgesics to PRN oxycodone 10/13 Cont PT   SOCIAL/DISPO: [name] has been somewhat difficult and has fired ID and Hospitalist services We have sought transfer to [facility] but she has been turned down by [facility] and [facility] We are seeking return to [facility] but she cannot go there with NGT and husband has been reluctant to consent to G-tube placement Care management very involved  I have personally obtained a history, examined the patient, evaluated Pertinent laboratory and RadioGraphic/imaging results, and  formulated the assessment and plan The Patient requires high complexity decision making for assessment and support, frequent evaluation and titration of therapies. Husband satisfied with Plan of action and management. All questions answered  Lucie Leather, M.D.  [facility]  Medical Director [facility] Medical Director [facility] Cardio-Pulmonary Department

## 2015-09-09 NOTE — Progress Notes (Signed)
Post hd tx 

## 2015-09-09 NOTE — Progress Notes (Signed)
Pre-hd tx 

## 2015-09-09 NOTE — Progress Notes (Signed)
Subjective:  Patient remains critically ill.  Overall doing fair Acknowledges when one enters the room No shortness of breath Every feeds are going at 20 cc per hour     Objective:  Vital signs in last 24 hours:  Temp:  [98.3 F (36.8 C)-99.4 F (37.4 C)] 98.3 F (36.8 C) (10/21 0500) Pulse Rate:  [94-113] 99 (10/21 0500) Resp:  [0-27] 27 (10/21 0500) BP: (116-142)/(36-75) 116/38 mmHg (10/21 0500) SpO2:  [87 %-100 %] 97 % (10/21 0500) FiO2 (%):  [28 %-40 %] 40 % (10/21 0500) Weight:  [82.555 kg (182 lb)-83.915 kg (185 lb)] 83.915 kg (185 lb) (10/21 0444)  Weight change: -6.745 kg (-14 lb 13.9 oz) Filed Weights   09/07/15 1033 09/08/15 0900 09/09/15 0444  Weight: 89.3 kg (196 lb 13.9 oz) 82.555 kg (182 lb) 83.915 kg (185 lb)    Intake/Output: I/O last 3 completed shifts: In: 1875.8 [I.V.:485.8; NG/GT:1140; IV Piggyback:250] Out: -    Intake/Output this shift:     Physical Exam: General: No acute distress  Head: +NGT   Eyes: anicteric  Neck: Trach in place  Lungs:  Scattered rhonchi, normal effort  Heart: S1S2 no rubs  Abdomen:  Soft, nontender,   Extremities:  +right upper extremity edema, trace edema of left upper extremity    Neurologic: Opens eyes to commands, did not interact much  Skin:   Access: R IJ permcath    Basic Metabolic Panel:  Recent Labs Lab 09/04/15 0351 09/05/15 0450 09/08/15 1945  NA 140 141 136  K 4.1 4.4 3.5  CL 104 103 98*  CO2 32 33* 32  GLUCOSE 89 65 121*  BUN 38* 53* 55*  CREATININE 0.87 1.25* 1.31*  CALCIUM 7.7* 7.9* 7.5*    Liver Function Tests: No results for input(s): AST, ALT, ALKPHOS, BILITOT, PROT, ALBUMIN in the last 168 hours. No results for input(s): LIPASE, AMYLASE in the last 168 hours. No results for input(s): AMMONIA in the last 168 hours.  CBC:  Recent Labs Lab 09/04/15 0351 09/05/15 0450 09/09/15 0535  WBC 11.5* 11.1* 14.7*  HGB 7.9* 8.1* 8.1*  HCT 25.1* 25.2* 25.5*  MCV 96.4 95.8 96.4  PLT  183 198 206    Cardiac Enzymes: No results for input(s): CKTOTAL, CKMB, CKMBINDEX, TROPONINI in the last 168 hours.  BNP: Invalid input(s): POCBNP  CBG:  Recent Labs Lab 09/08/15 1252 09/08/15 1625 09/08/15 2010 09/09/15 0027 09/09/15 0337  GLUCAP 98 112* 118* 118* 94    Microbiology: Results for orders placed or performed during the hospital encounter of 08/16/2015  Blood Culture (routine x 2)     Status: None   Collection Time: 08/06/2015  8:51 AM  Result Value Ref Range Status   Specimen Description BLOOD Dolores HooseUNKO  Final   Special Requests BOTTLES DRAWN AEROBIC AND ANAEROBIC  3CC  Final   Culture NO GROWTH 5 DAYS  Final   Report Status 08/17/2015 FINAL  Final  Blood Culture (routine x 2)     Status: None   Collection Time: 08/14/2015  9:20 AM  Result Value Ref Range Status   Specimen Description BLOOD LEFT ARM  Final   Special Requests BOTTLES DRAWN AEROBIC AND ANAEROBIC  1CC  Final   Culture NO GROWTH 5 DAYS  Final   Report Status 08/17/2015 FINAL  Final  Wound culture     Status: None   Collection Time: 08/19/2015  9:20 AM  Result Value Ref Range Status   Specimen Description DECUBITIS  Final  Special Requests Normal  Final   Gram Stain   Final    FEW WBC SEEN MANY GRAM NEGATIVE RODS RARE GRAM POSITIVE COCCI    Culture   Final    HEAVY GROWTH ESCHERICHIA COLI MODERATE GROWTH ENTEROBACTER AEROGENES PROTEUS MIRABILIS HEAVY GROWTH ENTEROCOCCUS SPECIES VRE HAVE INTRINSIC RESISTANCE TO MOST COMMONLY USED ANTIBIOTICS AND THE ABILITY TO ACQUIRE RESISTANCE TO MOST AVAILABLE ANTIBIOTICS.    Report Status 08/16/2015 FINAL  Final   Organism ID, Bacteria ESCHERICHIA COLI  Final   Organism ID, Bacteria ENTEROBACTER AEROGENES  Final   Organism ID, Bacteria PROTEUS MIRABILIS  Final   Organism ID, Bacteria ENTEROCOCCUS SPECIES  Final      Susceptibility   Enterobacter aerogenes - MIC*    CEFTAZIDIME <=1 SENSITIVE Sensitive     CEFAZOLIN >=64 RESISTANT Resistant      CEFTRIAXONE <=1 SENSITIVE Sensitive     CIPROFLOXACIN <=0.25 SENSITIVE Sensitive     GENTAMICIN <=1 SENSITIVE Sensitive     IMIPENEM 1 SENSITIVE Sensitive     TRIMETH/SULFA <=20 SENSITIVE Sensitive     * MODERATE GROWTH ENTEROBACTER AEROGENES   Escherichia coli - MIC*    AMPICILLIN <=2 SENSITIVE Sensitive     CEFTAZIDIME <=1 SENSITIVE Sensitive     CEFAZOLIN <=4 SENSITIVE Sensitive     CEFTRIAXONE <=1 SENSITIVE Sensitive     CIPROFLOXACIN <=0.25 SENSITIVE Sensitive     GENTAMICIN <=1 SENSITIVE Sensitive     IMIPENEM <=0.25 SENSITIVE Sensitive     TRIMETH/SULFA <=20 SENSITIVE Sensitive     * HEAVY GROWTH ESCHERICHIA COLI   Proteus mirabilis - MIC*    AMPICILLIN >=32 RESISTANT Resistant     CEFTAZIDIME <=1 SENSITIVE Sensitive     CEFAZOLIN 8 SENSITIVE Sensitive     CEFTRIAXONE <=1 SENSITIVE Sensitive     CIPROFLOXACIN <=0.25 SENSITIVE Sensitive     GENTAMICIN <=1 SENSITIVE Sensitive     IMIPENEM 1 SENSITIVE Sensitive     TRIMETH/SULFA <=20 SENSITIVE Sensitive     * PROTEUS MIRABILIS   Enterococcus species - MIC*    AMPICILLIN >=32 RESISTANT Resistant     VANCOMYCIN >=32 RESISTANT Resistant     GENTAMICIN SYNERGY SENSITIVE Sensitive     TETRACYCLINE Value in next row Resistant      RESISTANT>=16    * HEAVY GROWTH ENTEROCOCCUS SPECIES  MRSA PCR Screening     Status: None   Collection Time: 09-02-15  2:38 PM  Result Value Ref Range Status   MRSA by PCR NEGATIVE NEGATIVE Final    Comment:        The GeneXpert MRSA Assay (FDA approved for NASAL specimens only), is one component of a comprehensive MRSA colonization surveillance program. It is not intended to diagnose MRSA infection nor to guide or monitor treatment for MRSA infections.   Blood culture (single)     Status: None   Collection Time: 09-02-2015  3:36 PM  Result Value Ref Range Status   Specimen Description BLOOD RIGHT ASSIST CONTROL  Final   Special Requests BOTTLES DRAWN AEROBIC AND ANAEROBIC  Final    Culture NO GROWTH 5 DAYS  Final   Report Status 08/17/2015 FINAL  Final  Wound culture     Status: None   Collection Time: 08/13/15 12:37 PM  Result Value Ref Range Status   Specimen Description WOUND  Final   Special Requests Normal  Final   Gram Stain   Final    FEW WBC SEEN TOO NUMEROUS TO COUNT GRAM NEGATIVE RODS FEW  GRAM POSITIVE COCCI    Culture   Final    HEAVY GROWTH ESCHERICHIA COLI MODERATE GROWTH PROTEUS MIRABILIS LIGHT GROWTH KLEBSIELLA PNEUMONIAE MODERATE GROWTH ENTEROCOCCUS GALLINARUM CRITICAL RESULT CALLED TO, READ BACK BY AND VERIFIED WITH: [facility] BORBA AT 1042 08/16/15 DV    Report Status 08/17/2015 FINAL  Final   Organism ID, Bacteria ESCHERICHIA COLI  Final   Organism ID, Bacteria PROTEUS MIRABILIS  Final   Organism ID, Bacteria KLEBSIELLA PNEUMONIAE  Final   Organism ID, Bacteria ENTEROCOCCUS GALLINARUM  Final      Susceptibility   Escherichia coli - MIC*    AMPICILLIN >=32 RESISTANT Resistant     CEFTAZIDIME 4 RESISTANT Resistant     CEFAZOLIN >=64 RESISTANT Resistant     CEFTRIAXONE 16 RESISTANT Resistant     GENTAMICIN 2 SENSITIVE Sensitive     IMIPENEM >=16 RESISTANT Resistant     TRIMETH/SULFA <=20 SENSITIVE Sensitive     Extended ESBL POSITIVE Resistant     PIP/TAZO Value in next row Resistant      RESISTANT>=128    CIPROFLOXACIN Value in next row Sensitive      SENSITIVE<=0.25    * HEAVY GROWTH ESCHERICHIA COLI   Klebsiella pneumoniae - MIC*    AMPICILLIN Value in next row Resistant      SENSITIVE<=0.25    CEFTAZIDIME Value in next row Resistant      SENSITIVE<=0.25    CEFAZOLIN Value in next row Resistant      SENSITIVE<=0.25    CEFTRIAXONE Value in next row Resistant      SENSITIVE<=0.25    CIPROFLOXACIN Value in next row Resistant      SENSITIVE<=0.25    GENTAMICIN Value in next row Sensitive      SENSITIVE<=0.25    IMIPENEM Value in next row Resistant      SENSITIVE<=0.25    TRIMETH/SULFA Value in next row Resistant       SENSITIVE<=0.25    PIP/TAZO Value in next row Resistant      RESISTANT>=128    * LIGHT GROWTH KLEBSIELLA PNEUMONIAE   Proteus mirabilis - MIC*    AMPICILLIN Value in next row Resistant      RESISTANT>=128    CEFTAZIDIME Value in next row Sensitive      RESISTANT>=128    CEFAZOLIN Value in next row Sensitive      RESISTANT>=128    CEFTRIAXONE Value in next row Sensitive      RESISTANT>=128    CIPROFLOXACIN Value in next row Sensitive      RESISTANT>=128    GENTAMICIN Value in next row Sensitive      RESISTANT>=128    IMIPENEM Value in next row Sensitive      RESISTANT>=128    TRIMETH/SULFA Value in next row Sensitive      RESISTANT>=128    PIP/TAZO Value in next row Sensitive      SENSITIVE<=4    * MODERATE GROWTH PROTEUS MIRABILIS   Enterococcus gallinarum - MIC*    AMPICILLIN Value in next row Resistant      SENSITIVE<=4    GENTAMICIN SYNERGY Value in next row Sensitive      SENSITIVE<=4    CIPROFLOXACIN Value in next row Resistant      RESISTANT>=8    TETRACYCLINE Value in next row Resistant      RESISTANT>=16    * MODERATE GROWTH ENTEROCOCCUS GALLINARUM  Wound culture     Status: None   Collection Time: 08/15/15  2:53 PM  Result Value Ref Range  Status   Specimen Description WOUND  Final   Special Requests NONE  Final   Gram Stain FEW WBC SEEN NO ORGANISMS SEEN   Final   Culture NO GROWTH 3 DAYS  Final   Report Status 08/18/2015 FINAL  Final  C difficile quick scan w PCR reflex     Status: None   Collection Time: 08/17/15 11:34 AM  Result Value Ref Range Status   C Diff antigen NEGATIVE NEGATIVE Final   C Diff toxin NEGATIVE NEGATIVE Final   C Diff interpretation Negative for C. difficile  Final  Stool culture     Status: None   Collection Time: 09/03/15  3:51 PM  Result Value Ref Range Status   Specimen Description STOOL  Final   Special Requests Immunocompromised  Final   Culture   Final    NO SALMONELLA OR SHIGELLA ISOLATED No Pathogenic E. coli  detected NO CAMPYLOBACTER DETECTED    Report Status 09/07/2015 FINAL  Final    Coagulation Studies: No results for input(s): LABPROT, INR in the last 72 hours.  Urinalysis: No results for input(s): COLORURINE, LABSPEC, PHURINE, GLUCOSEU, HGBUR, BILIRUBINUR, KETONESUR, PROTEINUR, UROBILINOGEN, NITRITE, LEUKOCYTESUR in the last 72 hours.  Invalid input(s): APPERANCEUR    Imaging: Dg Chest Port 1 View  09/07/2015  CLINICAL DATA:  Aspiration pneumonia EXAM: PORTABLE CHEST 1 VIEW COMPARISON:  Five days ago FINDINGS: Dialysis catheter from the right with tip stable at the upper right atrium. Tracheostomy tube and left IJ central line have a stable position. Feeding tube enters the stomach at least. New bibasilar opacity. No edema, effusion, or air leak. Chronic mild cardiomegaly. IMPRESSION: 1. Bibasilar atelectasis or pneumonia that is new from 09/02/2015. 2. Unchanged positioning of tubes and lines. Electronically Signed   By: Marnee Spring M.D.   On: 09/07/2015 14:02     Medications:   . norepinephrine 0 mcg/min (09/08/15 0442)   . acidophilus  1 capsule Oral Daily  . amantadine  200 mg Oral Q7 days  . antiseptic oral rinse  7 mL Mouth Rinse q12n4p  . chlorhexidine  15 mL Mouth Rinse BID  . clindamycin (CLEOCIN) IV  600 mg Intravenous 3 times per day  . epoetin (EPOGEN/PROCRIT) injection  10,000 Units Intravenous Q M,W,F-HD  . feeding supplement (JEVITY 1.2 CAL)  1,000 mL Per Tube Q24H  . feeding supplement (PRO-STAT SUGAR FREE 64)  30 mL Per Tube 6 times per day  . free water  200 mL Per Tube 3 times per day  . heparin subcutaneous  5,000 Units Subcutaneous Q12H  . hydrocortisone  10 mg Per Tube BID  . lidocaine  1 patch Transdermal Q24H  . megestrol  400 mg Per Tube Daily  . midodrine  10 mg Oral TID WC  . multivitamin  5 mL Per Tube Daily  . nystatin   Topical TID  . pantoprazole sodium  40 mg Per Tube Q1200  . sodium chloride  10-40 mL Intracatheter Q12H  . sodium  hypochlorite   Irrigation BID   sodium chloride, sodium chloride, sodium chloride, sodium chloride, acetaminophen (TYLENOL) oral liquid 160 mg/5 mL, alteplase, alteplase, anticoagulant sodium citrate, ipratropium-albuterol, lidocaine (PF), lidocaine-prilocaine, loperamide, morphine injection, oxyCODONE-acetaminophen, pentafluoroprop-tetrafluoroeth, sodium chloride  Assessment/ Plan:  49 y.o. 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 End-stage renal disease on hemodialysis on HD MWF. The patient has been on dialysis since October of 2014. R IJ permcath.  -  Patient did not tolerate dialysis today due to intradialytic hypotension which did not respond to usual measures  - She is currently being monitored in ICU Appreciate cardiology eval suggesting LVH and small LV cavity. Will need to keep dialysis BFR slow (along the lines of CRRT) - plan for HD today   2. Anemia of CKD: status post PRBC transfusions. Hgb down to 8.1 - EPO with HD   3. Secondary hyperparathyroidism: PTH low at 55,  . - no indication for binders or activated vitamin D at this point    4. Hypotension: required vasopressors earlier in admission. - continue midodrine 10mg  po tid.  We have also been giving PRN albumin to keep BP up during HD, but despite this UF has been limited by hypotension.  5.  Dispo:  Unclear at this time. Has to be able to sit in  chair for the duration of treatment prior to outpatient discharge Otherwise may need to look into stretcher dialysis facilities   LOS: 28 Brennen Gardiner 10/21/20168:46 AM

## 2015-09-09 NOTE — Care Management (Signed)
Patient was out of bed in the chair today for short time.  Patient's tube feedings are continuous at 30 cc/hr and to remain at this rate over the weekend if tolerates.  will also monitor stools.  Psych has assessed and has determined that patient is not able to make decisions and does need guardianship so that appropriate decision making can be done on patient's behalf.   Her sacral wound was debrided at bedside.  Patient required several doses of morphine.   Levophed restarted this afternoon.  Cardiology has seen and recommends discussing TEE risks/benefits  with husband when patient is medically stable.  Hypotension with dialysis most likely due to small left ventricular size, significant LVH per cardiology. Nephrology keeping dialysis blood flow rate very slow..Marland Kitchen

## 2015-09-09 NOTE — Progress Notes (Signed)
Nutrition Follow-up  DOCUMENTATION CODES:   Severe malnutrition in context of acute illness/injury  INTERVENTION:   Coordination of Care: discussed nutritional poc during ICU rounds with MD Kasa. TF continues at low rate of 20 ml/hr, remains NPO. No plan for diet progression until pt tolerating PMV trials. Discussed titration of TF rate with MD Kasa; MD ok with increasing TF to rate of 30 ml/hr. Recommended goal rate of current TF formula to meet estimated nutritional needs is 60 ml/hr but rate to meet nutritional needs may even be higher. Discussed with MD. At this rate, TF will provide 2328 kcals, 171 g of protein (includes the protein and calories from Prostat supplementation).  Discussed possibility of consideration of TPN/supplemental TPN during ICU rounds; MD does not want TPN at this time. Of note, Clinical research associatewriter has discussed TPN with pt's husband in the past; Husband reports pt has been on TPN in the past and "has not tolerated;" Husband reported at that time that he would not want pt to be started on TPN EN: Jevity 1.2 TF increased to 30 ml/hr per MD Kasa order; per MD Kasa, plan to keep at this rate (as tolerated) through the weekend. Continue Prostat supplementation q 4 hours. Discussed with Caralyn GuileSandra RN. Jevity 1.2 (current TF formula) and Jevity 1.5 (previous TF formula) are both fiber-containing formulas. Pt was started on Jevity formula per Husband's request as he was hoping that a formula containing fiber would "bulk up" the patient's stool. Pt stool continues to be loose. Pt may benefit from switching formula to the Vital line of products which are peptide based formulas specifically designed for patients with symptoms of GI intolerance. It is definitley possible that as TF rate is increased that the amount and frequency of stool will increase. Will continue to follow  NUTRITION DIAGNOSIS:   Inadequate oral intake related to wound healing, acute illness, chronic illness as evidenced by NPO  status, estimated needs. Continues  GOAL:   Patient will meet greater than or equal to 90% of their needs  MONITOR:    (Energy Intake, Anthropometrics, Digestive System, Electrolyte/Renal Profile, Glucose Profile)  REASON FOR ASSESSMENT:   Consult Enteral/tube feeding initiation and management  ASSESSMENT:    Pt s/p bedside surgical debridment of sacral ulcer, working with PT this AM, plan for PMV trials today, plan for trial of slow HD, receiving morphine per pain, remains on trach collar  EN: tolerating Jevity 1.2 at rate of 20 ml/hr, Prostat 6 packets per day, 200 mL free water q 8 hours  Digestive System: per Dois DavenportSandra RN, pt had loose stool during night shift, per documentation, pt with large loose/watery clay colored stool early this AM; no signs of TF intolerance  Skin:   (stage IV sacrum, stage III hip, unstageable foot; rectal ulcer from fleixseal, ulcer in nose from dobhoff)  Electrolyte and Renal Profile:  Recent Labs Lab 09/04/15 0351 09/05/15 0450 09/08/15 1945  BUN 38* 53* 55*  CREATININE 0.87 1.25* 1.31*  NA 140 141 136  K 4.1 4.4 3.5   Glucose Profile:  Recent Labs  09/09/15 0337 09/09/15 0933 09/09/15 1155  GLUCAP 94 86 92   Nutritional Anemia Profile:  CBC Latest Ref Rng 09/09/2015 09/05/2015 09/04/2015  WBC 3.6 - 11.0 K/uL 14.7(H) 11.1(H) 11.5(H)  Hemoglobin 12.0 - 16.0 g/dL 8.1(L) 8.1(L) 7.9(L)  Hematocrit 35.0 - 47.0 % 25.5(L) 25.2(L) 25.1(L)  Platelets 150 - 440 K/uL 206 198 183    Meds: MVI, megace, acidophilus, midodrine, imodium prn, morphine  Height:  Ht Readings from Last 1 Encounters:  08-20-2015  (1.753 m)    Weight:   Wt Readings from Last 1 Encounters:  09/09/15 185 lb (83.915 kg)    BMI:  Body mass index is 27.31 kg/(m^2).  Estimated Nutritional Needs:   Kcal:  2175-2535 kcals (BEE 1509, 1.2 AF, 1.2-1.4 IF) or 2490-2905 kcals (30-35 kcals/kg)   Protein:  125-166 g (1.5-2.0 g/kg) but likely closer to 166-208 g  (2.0-2.5 g/kg)   Fluid:  1000 mL plus UOP  EDUCATION NEEDS:   No education needs identified at this time  HIGH Care Level  Romelle Starcher MS, RD, LDN [phone] Pager

## 2015-09-09 NOTE — Progress Notes (Signed)
HD tx completed.

## 2015-09-09 NOTE — Treatment Plan (Signed)
Spoke with Dr Anne HahnWillis regarding patients EKG results. At this point no new orders. He did state that there are prn orders in place for rate control.

## 2015-09-09 NOTE — Progress Notes (Signed)
49 yr old with very complicated history well known to my associates with a sacral decubitus wound.  Patient doing about the same.  Discussed with nurses the different dressing options availble.   Filed Vitals:   09/09/15 0800  BP: 137/56  Pulse: 101  Temp:   Resp: 22   PE:  Gen: opens eyes and looks around but non-verbal Sacrum: 15cm x 12cm sacral wound with some fibrinous in about 3cm area at left side, sharply debrided with scissors the gray fibrionous and skin edges, good granulation tissue   A/P:  Sacral decubitus: sharply debrided tissue at bedside.  My recommendation would be to continue Dakin's soaked kerlex gauze, the woven kind, to wound twice daily; pressure offloading and continued nutritional support.

## 2015-09-09 NOTE — Progress Notes (Signed)
Physical Therapy Treatment Patient Details Name: Courtney [name] MRN: [id number] DOB: [birth year] Today's Date: 09/09/2015    History of Present Illness Pt is a 42 F who has been in medical facilities (hosp, LTAC, rehab) for 2 yrs following gastric bypass surgery with multiple complications. Now with chronic trach, ESRD, profound debilitation, severe sacral pressure ulcer (stage IV). Was seemingly making progress and transferred to rehab facility approx one week prior to this admission. She was sent to [facility] ED 9/23 with AMS and hypotension. Admitted with sepsis related to infected sacral ulcer.  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  Patient transferred to CCU to med-surg floor during hospitalization, but experienced profound interdialytic hypotension requiring return transfer to CCU for use of pressors (now off).  New orders received to resume PT; cleared by RN for participation as tolerated.    PT Comments    Patient cleared by RN for participation with session this AM; okay for attempts at bed mobility and progression towards upright.  Patient continues to be partially resistant to mobility efforts initially, but improves with participation and active effort with continued encouragement/repetition. Able to initiate rolling bilat (with static holds for approx 10 sec each) for pressure relief and mobility progression; patient attempting to reach across midline with L UE (50% time) and grasp bedrail as able to assist with transition.  Fully dependent for position of LEs and initiation of any rotation. Also progressed patient towards more upright position to facilitate progression towards OOB/chair as appropriate.  Able to tolerate elevated HOB to 50 degrees without difficulty; vitals stable and WFL (BP 109/79, HR 110, SaO2 96%, RR 25).   Unable to maintain head control/position with progression beyond 50 degrees at this time; also limited by pain (R LE, sacrum?).  RN informed/aware of patient position, tolerance to session, vitals response and indicators of pain (in during session to administer additional pain medication). Discussed case with Dr. Belia Heman regarding mobility progression--agrees patient appropriate for continued attempts at movement of extremities, progressive tolerance to upright and OOB to chair (likely via mechanical lift) as tolerated.  Will continue efforts as patient tolerates.   Follow Up Recommendations  LTACH;SNF     Equipment Recommendations       Recommendations for Other Services       Precautions / Restrictions Precautions Precautions: Fall Precaution Comments: stage 4 sacral ulcer, NG tube/NPO, trach, contact isolation, R IJ permcath, L IJ central line Restrictions Weight Bearing Restrictions: No    Mobility  Bed Mobility Overal bed mobility: +2 for physical assistance;Needs Assistance Bed Mobility: Rolling Rolling: Total assist;+2 for physical assistance         General bed mobility comments: dep assist +2 for rolling bilat.  Patient initially refusing active participation with L UE, pulling hand away from therapist; however, with continued encouragement/repetition, patient attempting to hold bedrail to assist with maintaining position.  Dep for position of LEs and initiation of any rotation; limited compliance with cervical rotation or flexion during activity.  Transfers                    Ambulation/Gait                 Stairs            Wheelchair Mobility    Modified Rankin (Stroke Patients Only)       Balance  Cognition Arousal/Alertness: Awake/alert Behavior During Therapy: Flat affect Overall Cognitive Status: Difficult to assess (patient relying solely on head nods and facial expression for  communication during session)                      Exercises Other Exercises Other Exercises: Rolling towards R x3, towards L x2, dep assist +2 for position of LEs, initiation of mobility.  Initially with limited active effort during mobility efforts, but gradually attempting to utilize L UE (to maintain grasp on bedrails) with continued efforts/encouragement.  Facial grimacing noted with continued movement of R LE, but appears to resolve slightly with static positioning.  Maintains sidelying in each position for approx 10 seconds for pressure relief and pulmonary hygiene. Other Exercises: Progressed patient towards increased HOB elevation (for progression towards chair position); tolerating up to 50 degrees of elevation with vitals stable and WFL.  Limited head control noted with progression beyond 50 degrees; unable to tolerate full transition to chair position at this time (due to pain/fatigue).    General Comments        Pertinent Vitals/Pain Pain Assessment: Faces Faces Pain Scale: Hurts whole lot Pain Location: R hip/LE with all movement attempts Pain Descriptors / Indicators: Grimacing;Guarding Pain Intervention(s): Limited activity within patient's tolerance;Monitored during session;Patient requesting pain meds-RN notified (RN provided additional pain medication during session)    Home Living                      Prior Function            PT Goals (current goals can now be found in the care plan section) Acute Rehab PT Goals Patient Stated Goal: patient unable to verbalize PT Goal Formulation: Patient unable to participate in goal setting Time For Goal Achievement: 09/22/15 Potential to Achieve Goals: Fair Additional Goals Additional Goal #1: Patient will tolerate and participate with progression towards upright positioning with stable vitals and adequate pain control as appropriate for pulmonary hygiene, mobiltiy progression. Progress towards PT goals:  Progressing toward goals    Frequency  Min 2X/week    PT Plan Current plan remains appropriate    Co-evaluation             End of Session   Activity Tolerance: Patient limited by pain Patient left: in bed;with call bell/phone within reach     Time: 1010-1045 PT Time Calculation (min) (ACUTE ONLY): 35 min  Charges:  $Therapeutic Activity: 23-37 mins                    G Codes:      Leigh Blas H. Manson PasseyBrown, PT, DPT, NCS 09/09/2015, 1:43 PM [phone]

## 2015-09-09 NOTE — Progress Notes (Signed)
RT to room to place PMV on trach, patient shaking her head no, does not want PMV placed at this time.  RN, Dois DavenportSandra also attempted to get patient to agree for PMV placement and patient continues to shake her head no.

## 2015-09-10 LAB — GLUCOSE, CAPILLARY
Glucose-Capillary: 122 mg/dL — ABNORMAL HIGH (ref 65–99)
Glucose-Capillary: 133 mg/dL — ABNORMAL HIGH (ref 65–99)
Glucose-Capillary: 96 mg/dL (ref 65–99)

## 2015-09-10 NOTE — Evaluation (Signed)
Occupational Therapy Evaluation Patient Details Name: Mysti Haley MRN: [id number] DOB: 1966/08/05 Today's Date: 09/10/2015    History of Present Illness Pt is a 63 F who has been in medical facilities (hosp, LTAC, rehab) for 2 yrs following gastric bypass surgery with multiple complications. Now with chronic trach, ESRD, profound debilitation, severe sacral pressure ulcer (stage IV). Was seemingly making progress and transferred to rehab facility approx one week prior to this admission. She was sent to [facility] ED 9/23 with AMS and hypotension. Admitted with sepsis related to infected sacral ulcer.  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  Patient transferred to CCU to med-surg floor during hospitalization, but experienced profound interdialytic hypotension requiring return transfer to CCU for use of pressors (now off).  New orders received to resume PT; cleared by RN for participation as tolerated.   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  She is on dialysis 3 days a week and next session will be on Monday due to having difficulty tolerating dialysis 2 days ago.  No family present to assess PLOF.  Pt has limited ability to participate but with a lot of encouragement she was able to follow one step commands but inconsistently.  She was able to move LUE and hand to face to itch her skin above her lip but when asked to perform task again to swab mouth she was not able to.  NSG indicated she only has acitve movement in LUE and hand and presents with edema in R hand dorsal aspect of wrist.  She tolerated retrograde massage with lotion to help move fluid while cued to move fingers but she was not able to.  She opened her eyes after 15 minutes of encouragement.  NSG  present for evaluation and helping encourage patient to participate.  Rec co-tx with PT for functiional mobility training and OT minimum of 1X per week for edema massage, family ed and training and ADL re-training.  Rec SNF for continued rehab.  Also rec BUE and hands be positioned on pillows to help keep edema minimal and encourage patient to do as much as possible for herself at bed level.      Follow Up Recommendations  SNF    Equipment Recommendations   (to be further assessed)    Recommendations for Other Services       Precautions / Restrictions Precautions Precautions: Fall Precaution Comments: stage 4 sacral ulcer, NG tube/NPO, trach, contact isolation, R IJ permcath; dialysis 3 days a week with next one scheduled for Monday due to difficulty tolerating it a few days ago Restrictions Weight Bearing Restrictions: No      Mobility Bed Mobility                  Transfers                      Balance                                            ADL Overall ADL's : Needs assistance/impaired  General ADL Comments: Pt currently requires total assist for all ADLs and was not able to move extremities at all when asked to help complete oral care or grooming.  Tracheostomy in place with NG tube and NSG indicates she grimaces when in pain.  Unsure of PLOF since no family was present .     Vision     Perception     Praxis      Pertinent Vitals/Pain Pain Assessment: No/denies pain Pain Intervention(s): Limited activity within patient's tolerance;Monitored during session     Hand Dominance     Extremity/Trunk Assessment Upper Extremity Assessment Upper Extremity Assessment: RUE deficits/detail;LUE deficits/detail RUE Deficits / Details: not able to actively move RUE at all; not alert or responding well enough to assess sensation; edema R hand and tol edema massage well RUE  Coordination: decreased gross motor;decreased fine motor LUE Deficits / Details: pt not able to move LUE on command but was able to move hand to face to itch her face above lip and place back on pillow but when asked to do this she was not able to LUE Coordination: decreased gross motor;decreased fine motor   Lower Extremity Assessment Lower Extremity Assessment: Defer to PT evaluation       Communication Communication Communication: Tracheostomy   Cognition Arousal/Alertness: Lethargic Behavior During Therapy: Flat affect Overall Cognitive Status: Difficult to assess                     General Comments       Exercises       Shoulder Instructions      Home Living Family/patient expects to be discharged to:: Skilled nursing facility                                 Additional Comments: patient unable to provide information regarding PLOF or level of participation/function prior to hospitalization      Prior Functioning/Environment Level of Independence: Needs assistance  Gait / Transfers Assistance Needed: pt is bed bound and does not transfer at this time ADL's / Homemaking Assistance Needed: Dependent for all ADLs Communication / Swallowing Assistance Needed: Pt has trach currently; can respond using yes/no questions Comments: patient unable to provide information regarding PLOF or level of participation/function prior to hospitalization; will verify from family/resources as appropriate    OT Diagnosis: Generalized weakness;Acute pain;Hemiplegia non-dominant side;Paresis;Altered mental status   OT Problem List: Decreased strength;Decreased range of motion;Decreased activity tolerance;Decreased coordination;Obesity;Increased edema   OT Treatment/Interventions: Self-care/ADL training;Therapeutic activities;Patient/family education;Therapeutic exercise    OT Goals(Current goals can be found in the care plan section) Acute Rehab OT Goals Patient  Stated Goal: patient unable to verbalize Potential to Achieve Goals: Fair  OT Frequency: Min 1X/week   Barriers to D/C:            Co-evaluation              End of Session Nurse Communication:  (NSG present for evaluation)  Activity Tolerance: Patient limited by lethargy Patient left: in bed;with nursing/sitter in room   Time: 1245-1330 OT Time Calculation (min): 45 min Charges:  OT General Charges $OT Visit: 1 Procedure OT Evaluation $Initial OT Evaluation Tier I: 1 Procedure OT Treatments $Self Care/Home Management : 23-37 mins G-Codes:    Wofford,Susan 09/10/2015, 1:40 PM   Susanne BordersSusan Wofford, OTR/L ascom [phone]

## 2015-09-10 NOTE — Progress Notes (Addendum)
Pt lying in bed resting quietly . No changes in care noted, will cont to monitor.levophed continues at .

## 2015-09-10 NOTE — Progress Notes (Signed)
Subjective:  Patient remains critically ill.  Overall doing fair Acknowledges when one enters the room. No shortness of breath. Cooperated with mouth care tube feeds are going at 30 cc per hour     Objective:  Vital signs in last 24 hours:  Temp:  [98 F (36.7 C)-98.8 F (37.1 C)] 98 F (36.7 C) (10/22 0700) Pulse Rate:  [91-119] 99 (10/22 0700) Resp:  [0-29] 3 (10/22 0700) BP: (79-139)/(28-91) 139/52 mmHg (10/22 0700) SpO2:  [94 %-100 %] 100 % (10/22 0700) FiO2 (%):  [35 %-40 %] 35 % (10/22 0700) Weight:  [83.9 kg (184 lb 15.5 oz)-84 kg (185 lb 3 oz)] 84 kg (185 lb 3 oz) (10/22 0401)  Weight change: 1.345 kg (2 lb 15.5 oz) Filed Weights   09/09/15 0444 09/09/15 1345 09/10/15 0401  Weight: 83.915 kg (185 lb) 83.9 kg (184 lb 15.5 oz) 84 kg (185 lb 3 oz)    Intake/Output: I/O last 3 completed shifts: In: 350 [NG/GT:250; IV Piggyback:100] Out: -500    Intake/Output this shift:  Total I/O In: 10 [I.V.:10] Out: -   Physical Exam: General: No acute distress  Head: +NGT   Eyes: anicteric  Neck: Trach in place  Lungs:  Scattered rhonchi, normal effort  Heart: S1S2 no rubs  Abdomen:  Soft, nontender,   Extremities:  + dependent edema,   Neurologic:  nods to yes/no to some simple questions   Skin:   Access: R IJ permcath    Basic Metabolic Panel:  Recent Labs Lab 09/04/15 0351 09/05/15 0450 09/08/15 1945  NA 140 141 136  K 4.1 4.4 3.5  CL 104 103 98*  CO2 32 33* 32  GLUCOSE 89 65 121*  BUN 38* 53* 55*  CREATININE 0.87 1.25* 1.31*  CALCIUM 7.7* 7.9* 7.5*    Liver Function Tests: No results for input(s): AST, ALT, ALKPHOS, BILITOT, PROT, ALBUMIN in the last 168 hours. No results for input(s): LIPASE, AMYLASE in the last 168 hours. No results for input(s): AMMONIA in the last 168 hours.  CBC:  Recent Labs Lab 09/04/15 0351 09/05/15 0450 09/09/15 0535  WBC 11.5* 11.1* 14.7*  HGB 7.9* 8.1* 8.1*  HCT 25.1* 25.2* 25.5*  MCV 96.4 95.8 96.4  PLT 183  198 206    Cardiac Enzymes: No results for input(s): CKTOTAL, CKMB, CKMBINDEX, TROPONINI in the last 168 hours.  BNP: Invalid input(s): POCBNP  CBG:  Recent Labs Lab 09/09/15 0933 09/09/15 1155 09/09/15 1710 09/10/15 0028 09/10/15 0723  GLUCAP 86 92 91 122* 133*    Microbiology: Results for orders placed or performed during the hospital encounter of 08/16/2015  Blood Culture (routine x 2)     Status: None   Collection Time: 08/01/2015  8:51 AM  Result Value Ref Range Status   Specimen Description BLOOD Dolores HooseUNKO  Final   Special Requests BOTTLES DRAWN AEROBIC AND ANAEROBIC  3CC  Final   Culture NO GROWTH 5 DAYS  Final   Report Status 08/17/2015 FINAL  Final  Blood Culture (routine x 2)     Status: None   Collection Time: 07/21/2015  9:20 AM  Result Value Ref Range Status   Specimen Description BLOOD LEFT ARM  Final   Special Requests BOTTLES DRAWN AEROBIC AND ANAEROBIC  1CC  Final   Culture NO GROWTH 5 DAYS  Final   Report Status 08/17/2015 FINAL  Final  Wound culture     Status: None   Collection Time: 08/09/2015  9:20 AM  Result Value Ref Range  Status   Specimen Description DECUBITIS  Final   Special Requests Normal  Final   Gram Stain   Final    FEW WBC SEEN MANY GRAM NEGATIVE RODS RARE GRAM POSITIVE COCCI    Culture   Final    HEAVY GROWTH ESCHERICHIA COLI MODERATE GROWTH ENTEROBACTER AEROGENES PROTEUS MIRABILIS HEAVY GROWTH ENTEROCOCCUS SPECIES VRE HAVE INTRINSIC RESISTANCE TO MOST COMMONLY USED ANTIBIOTICS AND THE ABILITY TO ACQUIRE RESISTANCE TO MOST AVAILABLE ANTIBIOTICS.    Report Status 08/16/2015 FINAL  Final   Organism ID, Bacteria ESCHERICHIA COLI  Final   Organism ID, Bacteria ENTEROBACTER AEROGENES  Final   Organism ID, Bacteria PROTEUS MIRABILIS  Final   Organism ID, Bacteria ENTEROCOCCUS SPECIES  Final      Susceptibility   Enterobacter aerogenes - MIC*    CEFTAZIDIME <=1 SENSITIVE Sensitive     CEFAZOLIN >=64 RESISTANT Resistant     CEFTRIAXONE <=1  SENSITIVE Sensitive     CIPROFLOXACIN <=0.25 SENSITIVE Sensitive     GENTAMICIN <=1 SENSITIVE Sensitive     IMIPENEM 1 SENSITIVE Sensitive     TRIMETH/SULFA <=20 SENSITIVE Sensitive     * MODERATE GROWTH ENTEROBACTER AEROGENES   Escherichia coli - MIC*    AMPICILLIN <=2 SENSITIVE Sensitive     CEFTAZIDIME <=1 SENSITIVE Sensitive     CEFAZOLIN <=4 SENSITIVE Sensitive     CEFTRIAXONE <=1 SENSITIVE Sensitive     CIPROFLOXACIN <=0.25 SENSITIVE Sensitive     GENTAMICIN <=1 SENSITIVE Sensitive     IMIPENEM <=0.25 SENSITIVE Sensitive     TRIMETH/SULFA <=20 SENSITIVE Sensitive     * HEAVY GROWTH ESCHERICHIA COLI   Proteus mirabilis - MIC*    AMPICILLIN >=32 RESISTANT Resistant     CEFTAZIDIME <=1 SENSITIVE Sensitive     CEFAZOLIN 8 SENSITIVE Sensitive     CEFTRIAXONE <=1 SENSITIVE Sensitive     CIPROFLOXACIN <=0.25 SENSITIVE Sensitive     GENTAMICIN <=1 SENSITIVE Sensitive     IMIPENEM 1 SENSITIVE Sensitive     TRIMETH/SULFA <=20 SENSITIVE Sensitive     * PROTEUS MIRABILIS   Enterococcus species - MIC*    AMPICILLIN >=32 RESISTANT Resistant     VANCOMYCIN >=32 RESISTANT Resistant     GENTAMICIN SYNERGY SENSITIVE Sensitive     TETRACYCLINE Value in next row Resistant      RESISTANT>=16    * HEAVY GROWTH ENTEROCOCCUS SPECIES  MRSA PCR Screening     Status: None   Collection Time: 07/29/2015  2:38 PM  Result Value Ref Range Status   MRSA by PCR NEGATIVE NEGATIVE Final    Comment:        The GeneXpert MRSA Assay (FDA approved for NASAL specimens only), is one component of a comprehensive MRSA colonization surveillance program. It is not intended to diagnose MRSA infection nor to guide or monitor treatment for MRSA infections.   Blood culture (single)     Status: None   Collection Time: 08/05/2015  3:36 PM  Result Value Ref Range Status   Specimen Description BLOOD RIGHT ASSIST CONTROL  Final   Special Requests BOTTLES DRAWN AEROBIC AND ANAEROBIC  Final   Culture NO GROWTH 5  DAYS  Final   Report Status 08/17/2015 FINAL  Final  Wound culture     Status: None   Collection Time: 08/13/15 12:37 PM  Result Value Ref Range Status   Specimen Description WOUND  Final   Special Requests Normal  Final   Gram Stain   Final    FEW  WBC SEEN TOO NUMEROUS TO COUNT GRAM NEGATIVE RODS FEW GRAM POSITIVE COCCI    Culture   Final    HEAVY GROWTH ESCHERICHIA COLI MODERATE GROWTH PROTEUS MIRABILIS LIGHT GROWTH KLEBSIELLA PNEUMONIAE MODERATE GROWTH ENTEROCOCCUS GALLINARUM CRITICAL RESULT CALLED TO, READ BACK BY AND VERIFIED WITH: [facility] BORBA AT 1042 08/16/15 DV    Report Status 08/17/2015 FINAL  Final   Organism ID, Bacteria ESCHERICHIA COLI  Final   Organism ID, Bacteria PROTEUS MIRABILIS  Final   Organism ID, Bacteria KLEBSIELLA PNEUMONIAE  Final   Organism ID, Bacteria ENTEROCOCCUS GALLINARUM  Final      Susceptibility   Escherichia coli - MIC*    AMPICILLIN >=32 RESISTANT Resistant     CEFTAZIDIME 4 RESISTANT Resistant     CEFAZOLIN >=64 RESISTANT Resistant     CEFTRIAXONE 16 RESISTANT Resistant     GENTAMICIN 2 SENSITIVE Sensitive     IMIPENEM >=16 RESISTANT Resistant     TRIMETH/SULFA <=20 SENSITIVE Sensitive     Extended ESBL POSITIVE Resistant     PIP/TAZO Value in next row Resistant      RESISTANT>=128    CIPROFLOXACIN Value in next row Sensitive      SENSITIVE<=0.25    * HEAVY GROWTH ESCHERICHIA COLI   Klebsiella pneumoniae - MIC*    AMPICILLIN Value in next row Resistant      SENSITIVE<=0.25    CEFTAZIDIME Value in next row Resistant      SENSITIVE<=0.25    CEFAZOLIN Value in next row Resistant      SENSITIVE<=0.25    CEFTRIAXONE Value in next row Resistant      SENSITIVE<=0.25    CIPROFLOXACIN Value in next row Resistant      SENSITIVE<=0.25    GENTAMICIN Value in next row Sensitive      SENSITIVE<=0.25    IMIPENEM Value in next row Resistant      SENSITIVE<=0.25    TRIMETH/SULFA Value in next row Resistant      SENSITIVE<=0.25    PIP/TAZO  Value in next row Resistant      RESISTANT>=128    * LIGHT GROWTH KLEBSIELLA PNEUMONIAE   Proteus mirabilis - MIC*    AMPICILLIN Value in next row Resistant      RESISTANT>=128    CEFTAZIDIME Value in next row Sensitive      RESISTANT>=128    CEFAZOLIN Value in next row Sensitive      RESISTANT>=128    CEFTRIAXONE Value in next row Sensitive      RESISTANT>=128    CIPROFLOXACIN Value in next row Sensitive      RESISTANT>=128    GENTAMICIN Value in next row Sensitive      RESISTANT>=128    IMIPENEM Value in next row Sensitive      RESISTANT>=128    TRIMETH/SULFA Value in next row Sensitive      RESISTANT>=128    PIP/TAZO Value in next row Sensitive      SENSITIVE<=4    * MODERATE GROWTH PROTEUS MIRABILIS   Enterococcus gallinarum - MIC*    AMPICILLIN Value in next row Resistant      SENSITIVE<=4    GENTAMICIN SYNERGY Value in next row Sensitive      SENSITIVE<=4    CIPROFLOXACIN Value in next row Resistant      RESISTANT>=8    TETRACYCLINE Value in next row Resistant      RESISTANT>=16    * MODERATE GROWTH ENTEROCOCCUS GALLINARUM  Wound culture     Status: None   Collection  Time: 08/15/15  2:53 PM  Result Value Ref Range Status   Specimen Description WOUND  Final   Special Requests NONE  Final   Gram Stain FEW WBC SEEN NO ORGANISMS SEEN   Final   Culture NO GROWTH 3 DAYS  Final   Report Status 08/18/2015 FINAL  Final  C difficile quick scan w PCR reflex     Status: None   Collection Time: 08/17/15 11:34 AM  Result Value Ref Range Status   C Diff antigen NEGATIVE NEGATIVE Final   C Diff toxin NEGATIVE NEGATIVE Final   C Diff interpretation Negative for C. difficile  Final  Stool culture     Status: None   Collection Time: 09/03/15  3:51 PM  Result Value Ref Range Status   Specimen Description STOOL  Final   Special Requests Immunocompromised  Final   Culture   Final    NO SALMONELLA OR SHIGELLA ISOLATED No Pathogenic E. coli detected NO CAMPYLOBACTER DETECTED     Report Status 09/07/2015 FINAL  Final    Coagulation Studies: No results for input(s): LABPROT, INR in the last 72 hours.  Urinalysis: No results for input(s): COLORURINE, LABSPEC, PHURINE, GLUCOSEU, HGBUR, BILIRUBINUR, KETONESUR, PROTEINUR, UROBILINOGEN, NITRITE, LEUKOCYTESUR in the last 72 hours.  Invalid input(s): APPERANCEUR    Imaging: No results found.   Medications:   . norepinephrine 2 mcg/min (09/10/15 0522)   . acidophilus  1 capsule Oral Daily  . amantadine  200 mg Oral Q7 days  . antiseptic oral rinse  7 mL Mouth Rinse q12n4p  . chlorhexidine  15 mL Mouth Rinse BID  . clindamycin (CLEOCIN) IV  600 mg Intravenous 3 times per day  . epoetin (EPOGEN/PROCRIT) injection  10,000 Units Intravenous Q M,W,F-HD  . feeding supplement (JEVITY 1.2 CAL)  1,000 mL Per Tube Q24H  . feeding supplement (PRO-STAT SUGAR FREE 64)  30 mL Per Tube 6 times per day  . free water  200 mL Per Tube 3 times per day  . heparin subcutaneous  5,000 Units Subcutaneous Q12H  . hydrocortisone  10 mg Per Tube BID  . lidocaine  1 patch Transdermal Q24H  . megestrol  400 mg Per Tube Daily  . midodrine  10 mg Oral TID WC  . multivitamin  5 mL Per Tube Daily  . nystatin   Topical TID  . pantoprazole sodium  40 mg Per Tube Q1200  . sodium chloride  10-40 mL Intracatheter Q12H  . sodium hypochlorite   Irrigation BID   sodium chloride, sodium chloride, sodium chloride, sodium chloride, acetaminophen (TYLENOL) oral liquid 160 mg/5 mL, alteplase, alteplase, anticoagulant sodium citrate, ipratropium-albuterol, lidocaine (PF), lidocaine-prilocaine, loperamide, morphine injection, oxyCODONE-acetaminophen, pentafluoroprop-tetrafluoroeth, sodium chloride  Assessment/ Plan:  49 y.o. 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 End-stage renal disease on hemodialysis on HD MWF. The patient has been on dialysis since October of 2014. R IJ permcath.  -  Patient did not tolerate dialysis today due to intradialytic hypotension which did not respond to usual measures , therefore was  transferred to the ICU - She was dialyzed in the ICU yesterday with 5 mics of Levophed to maintain map of 65 -Appreciate cardiology eval suggesting LVH and small LV cavity. Will need to keep dialysis BFR slow (along the lines of CRRT), also consider sodium profile and use of IV albumin - plan for next HD on Monday   2. Anemia of CKD: status post PRBC transfusions. Hgb down to 8.1 - EPO with HD   3. Secondary hyperparathyroidism: PTH low at 55,  . - no indication for binders or activated vitamin D at this point    4. Hypotension: required vasopressors earlier in admission. - continue midodrine  po tid.  We have also been giving PRN albumin to keep BP up during HD, but despite this UF has been limited by hypotension.  5.  Dispo:  Unclear at this time. Has to be able to sit in chair for the duration of treatment prior to outpatient discharge Otherwise may need to look into stretcher dialysis facilities Overall prognosis is extremely poor   LOS: 29 Courtney [name] 10/22/20169:45 AM

## 2015-09-10 NOTE — Progress Notes (Addendum)
[facility] Critical Care Medicine Progess Note  Name: Courtney [name] MRN: [id number] DOB: [birth year]    ADMISSION DATE:  08/04/2015   INTERVAL HISTORY: 10/22 - off vasopressors, tolerated slow HD yesterday, tracking with eyes, and will nod head to answer questions. Sacral decub debrided at bedside yesterday. Restarted on levophed during dialysis, stopped this AM.   10/21-off vasopressors s/p wound debridement at bedside by gen surgery Morphine given for severe pain, plan for slow HD today and assess BP Continue step down monitoring, plan for TEE next week will discuss with Husband  10/20-transferred to step down for sepsis/aspiration pneumonia, weaned off levophed this AM -abx adjusted, continue TF's, will NOT start oral feeds Plan for TEE at some point, appreciate surgical input, obtain CBC,chem 7, CXR as needed Will update husband today  10/19-patient with minimal amounts of food this AM, will plan to continue oral intake along with Tube feedings and assess calorie count This was discussed with Husband, plan to stop TF's tomorrow if she tolerates oral feeds and remove NG tube in next 2 days Will talk and discuss with Husband that patient is very high risk for aspiration-will obtain Surgery consult to reassess decub ulcer Will consult cardiology to assess for TEE  10/18 discussed with husband-patient passed swallow eval spoke with Speech Therapist Will add Pureed thick nectar foods and continue Enteral feeds through NG tube.  Will add Megace   On 10/16  the patient's husband was upset due to placement of a rectal tube by on-call physician, this was removed. It was also noted that the pt developed diarrhea, therefore Jevity was changed from 1.5 with fiber to Jevity 1.2; the rate was decreased to 20 cc/hr and the pt was given a dose of lomotil, which has helped the diarrhea. It was also sent for stool culture and Cdif.   On 10/17 discussed with husband options going  forward. He would like to transfer to [facility] as it is closer to him, also  [facility] is there so he can have his wife evaluated better and that he can have the [employer] come and check on her status personally-Dr. Belia Heman has discussed case with Hospitalist and they pretty much have refused to accept her at this time.   -He declines transfer of his wife back to [place].   -He would like to repeat speech evaluation and Pasey-Muir valve as he hopes that his wife will be able to eat and then will not need a feeding tube. He understands that she may be "stuck" in the hospital as currently no local LTAC is accepting her. He is continuing discussions with [facility].   -He would like his wife restarted on aspirin due to stroke risk. It was explained that given her life threatening bleed the rounding physician did not feel comfortable restarting this medication at this time, but he could revisit this discussion with other physicians in the future.    INITIAL PRESENTATION:  53 F who has been in medical facilities (hosp, LTAC, rehab) for 2 yrs following gastric bypass surgery with multiple complications. Now with chronic trach, ESRD, profound debilitation, severe sacral pressure ulcer. Was seemingly making progress and transferred to rehab facility approx one week prior to this admission. She was sent to [facility] ED with AMS and hypotension. Working dx of severe sepsis/septic shock due to infected sacral pressure ulcer. Also has R side externalized HD cath and tunneled L IJ CVL. She has history of resistant bacterial infections. She is on chronic steroids. Her  husband indicates that she has been diagnosed with adrenal insuff at some point during the past 2 yrs  MAJOR EVENTS/TEST RESULTS: Admission 02/07/14-05/07/14 Admission 07/21/14-09/06/14 Discharged to [facility]. Pt had palliative consult at that time, were asked to sign off by husband.  09/23 CT head: NAD 09/23 EEG: no epileptiform activity 09/23 PRBCs for Hgb 6.4 09/24  bedside debridement of sacral wound. Abscess drained 09/25 Off vasopressors. More alert. No distress. Worsening thrombocytopenia. Vanc DC'd 09/29  Dr. [name] (I.D) excused from the case by patient's husband. 10/02 MRI -multiple infarcts 10/03 tracheal bleeding- transfused platelets 10/03 hospitalist service excused from the case by patient's husband 10/03 Echocardiogram ejection fraction was 55-60%, pulmonary systolic pressure was 39 mmHg 07/8109/08 restart TF's at lower rate, attempt reg HD  10/12 Transferred to med-surg floor. 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 History of sepsis from C. glabrata MRSA PCR 9/23 >> NEG Urine 9/23 >>  Wound (swab) 9/23 >> multiple organisms Wound (debridement) 9/24 >> Enterococcus, K. Pneumoniae, P. Mirabilis, VRE Wound 9/26 >> No growth Blood 9/23 >> neg CDiff 9/27>>neg  ANTIMICROBIALS:  Aztreonam 9/23 >> 9/24 Vanc 9/23 >> 9/25 Vanc 9/26>>9/27 Daptomycin 9/27>> 10/12 Meropenem 9/23 >> 10/14   SUBJECTIVE:  RASS 0. Nonverbal, squeeze left hand today, was able to track and follow with eyes. Responded to questions by nodding head  VITAL SIGNS: Temp:  [98 F (36.7 C)-98.8 F (37.1 C)] 98 F (36.7 C) (10/22 0700) Pulse Rate:  [91-119] 99 (10/22 0700) Resp:  [0-29] 3 (10/22 0700) BP: (79-139)/(30-91) 139/52 mmHg (10/22 0700) SpO2:  [94 %-100 %] 100 % (10/22  0700) FiO2 (%):  [35 %-40 %] 35 % (10/22 0700) Weight:  [184 lb 15.5 oz (83.9 kg)-185 lb 3 oz (84 kg)] 185 lb 3 oz (84 kg) (10/22 0401) HEMODYNAMICS:   VENTILATOR SETTINGS: Vent Mode:  [-]  FiO2 (%):  [35 %-40 %] 35 % INTAKE / OUTPUT:  Intake/Output Summary (Last 24 hours) at 09/10/15 1021 Last data filed at 09/10/15 0932  Gross per 24 hour  Intake     10 ml  Output   -500 ml  Net    510 ml    PHYSICAL EXAMINATION: Physical Examination:   VS: BP 139/52 mmHg  Pulse 99  Temp(Src) 98 F (36.7 C) (Axillary)  Resp 3  Ht 5\' 9"  (1.753 m)  Wt 185 lb 3 oz (84 kg)  BMI 27.33 kg/m2  SpO2 100%  General Appearance: No resp distress  Neuro: profoundly diffusely weak HEENT: cushingoid facies, PERRLA, EOM intact Neck: trach site clean Pulmonary: clear anteriorly Cardiovascular: reg, no M  Abdomen: soft, NT, +BS Extremities: warm, R>L UE edema    LABORATORY PANEL:   CBC  Recent Labs Lab 09/09/15 0535  WBC 14.7*  HGB 8.1*  HCT 25.5*  PLT 206    Chemistries   Recent Labs Lab 09/08/15 1945  NA 136  K 3.5  CL 98*  CO2 32  GLUCOSE 121*  BUN 55*  CREATININE 1.31*  CALCIUM 7.5*     CXR: nnf   IMPRESSION/PLAN: PULMONARY A: 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   CARDIOVASCULAR A:  Septic shock, resolved  Hemorrhagic shock, resolved Chronic hypotension P:  MAP goal > 55 mmHg Cont midodrine  RENAL A:  ESRD  P:  Monitor BMET intermittently Monitor I/Os Correct electrolytes as indicated Intermittent HD per Renal Service  GASTROINTESTINAL A:  LGIB, resolved Hx of C. Diff Stool incontinence Dysphagia P:  Cont PPI Avoid anticoagulants  HEMATOLOGIC A:  Acute on chronic  anemia Thrombocytopenia - HIT panel negative 9/25 P:  DVT px: SCDs, resume SQ heparin 10/14 Monitor CBC intermittently Transfuse per usual guidelines plt count 198 10/17 >>  206 on 10/21  INFECTIOUS A:  Severe sepsis, resolved Sacral decubitus ulcer with abscess P:  Monitor temp, WBC count Micro and abx as above-off abx now  ENDOCRINE A:  DM 2, controlled. 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 Profound deconditioning Chronic pain P: RASS goal: 0 Minimize sedating meds Changed analgesics to PRN oxycodone 10/13 Cont PT   SOCIAL/DISPO: [name] has been somewhat difficult and has fired ID and Hospitalist services We have sought transfer to [facility] but she has been turned down by [facility] and [facility] We are seeking return to [facility] but she cannot go there with NGT and husband has been reluctant to consent to G-tube placement Care management very involved 10/22 spoke with husband over the phone, updated him on patient's clinical status.  I have personally obtained a history, examined the patient, evaluated Pertinent laboratory and [employer]/imaging results, and  formulated the assessment and plan The Patient requires high complexity decision making for assessment and support, frequent evaluation and titration of therapies. Critical Care time - 40 mins  Stephanie Acre, MD  Pulmonary and Critical Care Pager [phone] (please enter 7-digits) On Call Pager - [phone] (please enter 7-digits)

## 2015-09-11 LAB — GLUCOSE, CAPILLARY
GLUCOSE-CAPILLARY: 110 mg/dL — AB (ref 65–99)
Glucose-Capillary: 100 mg/dL — ABNORMAL HIGH (ref 65–99)
Glucose-Capillary: 128 mg/dL — ABNORMAL HIGH (ref 65–99)

## 2015-09-11 LAB — CBC
HEMATOCRIT: 24.3 % — AB (ref 35.0–47.0)
HEMOGLOBIN: 7.4 g/dL — AB (ref 12.0–16.0)
MCH: 29.2 pg (ref 26.0–34.0)
MCHC: 30.3 g/dL — ABNORMAL LOW (ref 32.0–36.0)
MCV: 96.2 fL (ref 80.0–100.0)
Platelets: 179 10*3/uL (ref 150–440)
RBC: 2.52 MIL/uL — ABNORMAL LOW (ref 3.80–5.20)
RDW: 22 % — ABNORMAL HIGH (ref 11.5–14.5)
WBC: 12.6 10*3/uL — AB (ref 3.6–11.0)

## 2015-09-11 MED ORDER — ALBUMIN HUMAN 25 % IV SOLN
12.5000 g | Freq: Once | INTRAVENOUS | Status: DC
  Filled 2015-09-11: qty 50

## 2015-09-11 MED ORDER — ALBUMIN HUMAN 25 % IV SOLN
12.5000 g | Freq: Once | INTRAVENOUS | Status: AC
  Administered 2015-09-12: 12.5 g via INTRAVENOUS
  Filled 2015-09-11: qty 50

## 2015-09-11 NOTE — Progress Notes (Addendum)
[facility] Critical Care Medicine Progess Note  Name: Courtney [name] MRN: [id number] DOB: [birth year]    ADMISSION DATE:  09/01/15   INTERVAL HISTORY: 10/23- off vasopressors, 1 soft BM overnight, low H/H this AM, husband at bedside.   10/22 - off vasopressors, tolerated slow HD yesterday, tracking with eyes, and will nod head to answer questions. Sacral decub debrided at bedside yesterday. Restarted on levophed during dialysis, stopped this AM.   10/21-off vasopressors s/p wound debridement at bedside by gen surgery Morphine given for severe pain, plan for slow HD today and assess BP Continue step down monitoring, plan for TEE next week will discuss with Husband  10/20-transferred to step down for sepsis/aspiration pneumonia, weaned off levophed this AM -abx adjusted, continue TF's, will NOT start oral feeds Plan for TEE at some point, appreciate surgical input, obtain CBC,chem 7, CXR as needed Will update husband today  10/19-patient with minimal amounts of food this AM, will plan to continue oral intake along with Tube feedings and assess calorie count This was discussed with Husband, plan to stop TF's tomorrow if she tolerates oral feeds and remove NG tube in next 2 days Will talk and discuss with Husband that patient is very high risk for aspiration-will obtain Surgery consult to reassess decub ulcer Will consult cardiology to assess for TEE  10/18 discussed with husband-patient passed swallow eval spoke with Speech Therapist Will add Pureed thick nectar foods and continue Enteral feeds through NG tube.  Will add Megace   On 10/16  the patient's husband was upset due to placement of a rectal tube by on-call physician, this was removed. It was also noted that the pt developed diarrhea, therefore Jevity was changed from 1.5 with fiber to Jevity 1.2; the rate was decreased to 20 cc/hr and the pt was given a dose of lomotil, which has helped the diarrhea. It was also  sent for stool culture and Cdif.   On 10/17 discussed with husband options going forward. He would like to transfer to [facility] as it is closer to him, also  [facility] is there so he can have his wife evaluated better and that he can have the [employer] come and check on her status personally-Dr. Belia Heman has discussed case with Hospitalist and they pretty much have refused to accept her at this time.   -He declines transfer of his wife back to [place].   -He would like to repeat speech evaluation and Pasey-Muir valve as he hopes that his wife will be able to eat and then will not need a feeding tube. He understands that she may be "stuck" in the hospital as currently no local LTAC is accepting her. He is continuing discussions with [facility].   -He would like his wife restarted on aspirin due to stroke risk. It was explained that given her life threatening bleed the rounding physician did not feel comfortable restarting this medication at this time, but he could revisit this discussion with other physicians in the future.    INITIAL PRESENTATION:  55 F who has been in medical facilities (hosp, LTAC, rehab) for 2 yrs following gastric bypass surgery with multiple complications. Now with chronic trach, ESRD, profound debilitation, severe sacral pressure ulcer. Was seemingly making progress and transferred to rehab facility approx one week prior to this admission. She was sent to [facility] ED with AMS and hypotension. Working dx of severe sepsis/septic shock due to infected sacral pressure ulcer. Also has R side externalized HD cath and tunneled  L IJ CVL. She has history of resistant bacterial infections. She is on chronic steroids. Her husband indicates that she has been diagnosed with adrenal insuff at some point during the past 2 yrs  MAJOR EVENTS/TEST RESULTS: Admission 02/07/14-05/07/14 Admission 07/21/14-09/06/14 Discharged to [facility]. Pt had palliative consult at that time, were asked to sign off by husband.  09/23 CT  head: NAD 09/23 EEG: no epileptiform activity 09/23 PRBCs for Hgb 6.4 09/24 bedside debridement of sacral wound. Abscess drained 09/25 Off vasopressors. More alert. No distress. Worsening thrombocytopenia. Vanc DC'd 09/29  Dr. [name] (I.D) excused from the case by patient's husband. 10/02 MRI -multiple infarcts 10/03 tracheal bleeding- transfused platelets 10/03 hospitalist service excused from the case by patient's husband 10/03 Echocardiogram ejection fraction was 55-60%, pulmonary systolic pressure was 39 mmHg 16/1010/08 restart TF's at lower rate, attempt reg HD  10/12 Transferred to med-surg floor. 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 Pneumoniae, P. Mirabilis, VRE Wound 9/26 >> No growth Blood 9/23 >> neg CDiff 9/27>>neg  ANTIMICROBIALS:  Aztreonam 9/23 >> 9/24 Vanc 9/23 >> 9/25 Vanc 9/26>>9/27 Daptomycin 9/27>> 10/12 Meropenem 9/23 >> 10/14   SUBJECTIVE:  RASS 0. Nonverbal at baseline, squeeze left hand, was able to track and follow with eyes. Responded to questions by nodding head. Husband at bedside, spent the night.   VITAL SIGNS: Temp:  [98.9 F (37.2 C)-99.8 F (37.7 C)] 99.8 F (37.7 C) (10/23 0739) Pulse Rate:  [98-115] 107 (10/23  0739) Resp:  [0-29] 25 (10/23 0739) BP: (109-161)/(28-84) 157/84 mmHg (10/23 0700) SpO2:  [84 %-100 %] 100 % (10/23 0803) FiO2 (%):  [35 %-40 %] 40 % (10/23 0803) Weight:  [186 lb 8.2 oz (84.6 kg)] 186 lb 8.2 oz (84.6 kg) (10/23 0448) HEMODYNAMICS:   VENTILATOR SETTINGS: Vent Mode:  [-]  FiO2 (%):  [35 %-40 %] 40 % INTAKE / OUTPUT:  Intake/Output Summary (Last 24 hours) at 09/11/15 0825 Last data filed at 09/11/15 0700  Gross per 24 hour  Intake    800 ml  Output      0 ml  Net    800 ml    PHYSICAL EXAMINATION: Physical Examination:   VS: BP 157/84 mmHg  Pulse 107  Temp(Src) 99.8 F (37.7 C) (Oral)  Resp 25  Ht 5\' 9"  (1.753 m)  Wt 186 lb 8.2 oz (84.6 kg)  BMI 27.53 kg/m2  SpO2 100%  General Appearance: No resp distress  Neuro: profoundly diffusely weak HEENT: cushingoid facies, PERRLA, EOM intact Neck: trach site clean Pulmonary: clear anteriorly Cardiovascular: reg, no M  Abdomen: soft, NT, +BS Extremities: warm, R>L UE edema    LABORATORY PANEL:   CBC  Recent Labs Lab 09/11/15 0522  WBC 12.6*  HGB 7.4*  HCT 24.3*  PLT 179    Chemistries   Recent Labs Lab 09/08/15 1945  NA 136  K  3.5  CL 98*  CO2 32  GLUCOSE 121*  BUN 55*  CREATININE 1.31*  CALCIUM 7.5*     CXR: nnf   IMPRESSION/PLAN: PULMONARY A: 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   CARDIOVASCULAR A:  Septic shock, resolved  Hemorrhagic shock, resolved Chronic hypotension P:  MAP goal > 55 mmHg Cont midodrine  RENAL A:  ESRD  P:  Monitor BMET intermittently Monitor I/Os Correct electrolytes as indicated Intermittent HD per Renal Service  GASTROINTESTINAL A:  LGIB, resolved Hx of C. Diff Stool  incontinence Dysphagia P:  Cont PPI Avoid anticoagulants  HEMATOLOGIC A:  Acute on chronic anemia Thrombocytopenia - HIT panel negative 9/25 P:  DVT px: SCDs, resume SQ heparin 10/14 Monitor CBC intermittently Transfuse per usual guidelines, Hb=7.4, recheck in the AM.  plt count 198 10/17 >>  206 on 10/21  INFECTIOUS A:  Severe sepsis, resolved Sacral decubitus ulcer with abscess P:  Monitor temp, WBC count Micro and abx as above-off abx now  ENDOCRINE A:  DM 2, controlled. 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 Profound deconditioning Chronic pain P: RASS goal: 0 Minimize sedating meds Changed analgesics to PRN oxycodone 10/13 Cont PT   SOCIAL/DISPO: [name] has been somewhat difficult and has fired ID and Hospitalist services We have sought transfer to [facility] but she has been turned down by  [facility] and [facility] We are seeking return to [facility] but she cannot go there with NGT and husband has been reluctant to consent to G-tube placement Care management very involved 10/23 spoke with husband at bedside (25 mins), updated him on patient's clinical status.  I have personally obtained a history, examined the patient, evaluated Pertinent laboratory and RadioGraphic/imaging results, and  formulated the assessment and plan The Patient requires high complexity decision making for assessment and support, frequent evaluation and titration of therapies. Critical Care time - 40 mins  Stephanie Acre, MD [facility] Pager [phone] (please enter 7-digits) On Call Pager - [phone] (please enter 7-digits)

## 2015-09-11 NOTE — Progress Notes (Signed)
Subjective:  Patient remains critically ill.  Overall doing fair Acknowledges when one enters the room. No shortness of breath.  tube feeds are going at 30 cc per hour Her husband is currently in the room with her    Objective:  Vital signs in last 24 hours:  Temp:  [98.9 F (37.2 C)-99.8 F (37.7 C)] 99.8 F (37.7 C) (10/23 0739) Pulse Rate:  [98-115] 107 (10/23 0739) Resp:  [0-29] 25 (10/23 0739) BP: (109-161)/(40-84) 157/84 mmHg (10/23 0700) SpO2:  [84 %-100 %] 100 % (10/23 0803) FiO2 (%):  [35 %-40 %] 40 % (10/23 1202) Weight:  [84.6 kg (186 lb 8.2 oz)] 84.6 kg (186 lb 8.2 oz) (10/23 0448)  Weight change: 0.7 kg (1 lb 8.7 oz) Filed Weights   09/09/15 1345 09/10/15 0401 09/11/15 0448  Weight: 83.9 kg (184 lb 15.5 oz) 84 kg (185 lb 3 oz) 84.6 kg (186 lb 8.2 oz)    Intake/Output: I/O last 3 completed shifts: In: 800 [I.V.:10; NG/GT:690; IV Piggyback:100] Out: -    Intake/Output this shift:     Physical Exam: General: No acute distress  Head: +NGT   Eyes: anicteric  Neck: Trach in place  Lungs:  Scattered rhonchi, normal effort  Heart: S1S2 no rubs  Abdomen:  Soft, nontender,   Extremities:  + dependent edema,   Neurologic:  nods to yes/no to some simple questions   Skin:   Access: R IJ permcath    Basic Metabolic Panel:  Recent Labs Lab 09/05/15 0450 09/08/15 1945  NA 141 136  K 4.4 3.5  CL 103 98*  CO2 33* 32  GLUCOSE 65 121*  BUN 53* 55*  CREATININE 1.25* 1.31*  CALCIUM 7.9* 7.5*    Liver Function Tests: No results for input(s): AST, ALT, ALKPHOS, BILITOT, PROT, ALBUMIN in the last 168 hours. No results for input(s): LIPASE, AMYLASE in the last 168 hours. No results for input(s): AMMONIA in the last 168 hours.  CBC:  Recent Labs Lab 09/05/15 0450 09/09/15 0535 09/11/15 0522  WBC 11.1* 14.7* 12.6*  HGB 8.1* 8.1* 7.4*  HCT 25.2* 25.5* 24.3*  MCV 95.8 96.4 96.2  PLT 198 206 179    Cardiac Enzymes: No results for input(s): CKTOTAL,  CKMB, CKMBINDEX, TROPONINI in the last 168 hours.  BNP: Invalid input(s): POCBNP  CBG:  Recent Labs Lab 09/10/15 0028 09/10/15 0723 09/10/15 1555 09/11/15 0005 09/11/15 0753  GLUCAP 122* 133* 96 100* 110*    Microbiology: Results for orders placed or performed during the hospital encounter of 2015/11/02  Blood Culture (routine x 2)     Status: None   Collection Time: 2015/11/02  8:51 AM  Result Value Ref Range Status   Specimen Description BLOOD Dolores HooseUNKO  Final   Special Requests BOTTLES DRAWN AEROBIC AND ANAEROBIC  3CC  Final   Culture NO GROWTH 5 DAYS  Final   Report Status 08/17/2015 FINAL  Final  Blood Culture (routine x 2)     Status: None   Collection Time: 2015/11/02  9:20 AM  Result Value Ref Range Status   Specimen Description BLOOD LEFT ARM  Final   Special Requests BOTTLES DRAWN AEROBIC AND ANAEROBIC  1CC  Final   Culture NO GROWTH 5 DAYS  Final   Report Status 08/17/2015 FINAL  Final  Wound culture     Status: None   Collection Time: 2015/11/02  9:20 AM  Result Value Ref Range Status   Specimen Description DECUBITIS  Final   Special Requests Normal  Final   Gram Stain   Final    FEW WBC SEEN MANY GRAM NEGATIVE RODS RARE GRAM POSITIVE COCCI    Culture   Final    HEAVY GROWTH ESCHERICHIA COLI MODERATE GROWTH ENTEROBACTER AEROGENES PROTEUS MIRABILIS HEAVY GROWTH ENTEROCOCCUS SPECIES VRE HAVE INTRINSIC RESISTANCE TO MOST COMMONLY USED ANTIBIOTICS AND THE ABILITY TO ACQUIRE RESISTANCE TO MOST AVAILABLE ANTIBIOTICS.    Report Status 08/16/2015 FINAL  Final   Organism ID, Bacteria ESCHERICHIA COLI  Final   Organism ID, Bacteria ENTEROBACTER AEROGENES  Final   Organism ID, Bacteria PROTEUS MIRABILIS  Final   Organism ID, Bacteria ENTEROCOCCUS SPECIES  Final      Susceptibility   Enterobacter aerogenes - MIC*    CEFTAZIDIME <=1 SENSITIVE Sensitive     CEFAZOLIN >=64 RESISTANT Resistant     CEFTRIAXONE <=1 SENSITIVE Sensitive     CIPROFLOXACIN <=0.25 SENSITIVE  Sensitive     GENTAMICIN <=1 SENSITIVE Sensitive     IMIPENEM 1 SENSITIVE Sensitive     TRIMETH/SULFA <=20 SENSITIVE Sensitive     * MODERATE GROWTH ENTEROBACTER AEROGENES   Escherichia coli - MIC*    AMPICILLIN <=2 SENSITIVE Sensitive     CEFTAZIDIME <=1 SENSITIVE Sensitive     CEFAZOLIN <=4 SENSITIVE Sensitive     CEFTRIAXONE <=1 SENSITIVE Sensitive     CIPROFLOXACIN <=0.25 SENSITIVE Sensitive     GENTAMICIN <=1 SENSITIVE Sensitive     IMIPENEM <=0.25 SENSITIVE Sensitive     TRIMETH/SULFA <=20 SENSITIVE Sensitive     * HEAVY GROWTH ESCHERICHIA COLI   Proteus mirabilis - MIC*    AMPICILLIN >=32 RESISTANT Resistant     CEFTAZIDIME <=1 SENSITIVE Sensitive     CEFAZOLIN 8 SENSITIVE Sensitive     CEFTRIAXONE <=1 SENSITIVE Sensitive     CIPROFLOXACIN <=0.25 SENSITIVE Sensitive     GENTAMICIN <=1 SENSITIVE Sensitive     IMIPENEM 1 SENSITIVE Sensitive     TRIMETH/SULFA <=20 SENSITIVE Sensitive     * PROTEUS MIRABILIS   Enterococcus species - MIC*    AMPICILLIN >=32 RESISTANT Resistant     VANCOMYCIN >=32 RESISTANT Resistant     GENTAMICIN SYNERGY SENSITIVE Sensitive     TETRACYCLINE Value in next row Resistant      RESISTANT>=16    * HEAVY GROWTH ENTEROCOCCUS SPECIES  MRSA PCR Screening     Status: None   Collection Time: 07/27/2015  2:38 PM  Result Value Ref Range Status   MRSA by PCR NEGATIVE NEGATIVE Final    Comment:        The GeneXpert MRSA Assay (FDA approved for NASAL specimens only), is one component of a comprehensive MRSA colonization surveillance program. It is not intended to diagnose MRSA infection nor to guide or monitor treatment for MRSA infections.   Blood culture (single)     Status: None   Collection Time: 07/29/2015  3:36 PM  Result Value Ref Range Status   Specimen Description BLOOD RIGHT ASSIST CONTROL  Final   Special Requests BOTTLES DRAWN AEROBIC AND ANAEROBIC  Final   Culture NO GROWTH 5 DAYS  Final   Report Status 08/17/2015 FINAL  Final   Wound culture     Status: None   Collection Time: 08/13/15 12:37 PM  Result Value Ref Range Status   Specimen Description WOUND  Final   Special Requests Normal  Final   Gram Stain   Final    FEW WBC SEEN TOO NUMEROUS TO COUNT GRAM NEGATIVE RODS FEW GRAM POSITIVE COCCI  Culture   Final    HEAVY GROWTH ESCHERICHIA COLI MODERATE GROWTH PROTEUS MIRABILIS LIGHT GROWTH KLEBSIELLA PNEUMONIAE MODERATE GROWTH ENTEROCOCCUS GALLINARUM CRITICAL RESULT CALLED TO, READ BACK BY AND VERIFIED WITH: [facility] BORBA AT 1042 08/16/15 DV    Report Status 08/17/2015 FINAL  Final   Organism ID, Bacteria ESCHERICHIA COLI  Final   Organism ID, Bacteria PROTEUS MIRABILIS  Final   Organism ID, Bacteria KLEBSIELLA PNEUMONIAE  Final   Organism ID, Bacteria ENTEROCOCCUS GALLINARUM  Final      Susceptibility   Escherichia coli - MIC*    AMPICILLIN >=32 RESISTANT Resistant     CEFTAZIDIME 4 RESISTANT Resistant     CEFAZOLIN >=64 RESISTANT Resistant     CEFTRIAXONE 16 RESISTANT Resistant     GENTAMICIN 2 SENSITIVE Sensitive     IMIPENEM >=16 RESISTANT Resistant     TRIMETH/SULFA <=20 SENSITIVE Sensitive     Extended ESBL POSITIVE Resistant     PIP/TAZO Value in next row Resistant      RESISTANT>=128    CIPROFLOXACIN Value in next row Sensitive      SENSITIVE<=0.25    * HEAVY GROWTH ESCHERICHIA COLI   Klebsiella pneumoniae - MIC*    AMPICILLIN Value in next row Resistant      SENSITIVE<=0.25    CEFTAZIDIME Value in next row Resistant      SENSITIVE<=0.25    CEFAZOLIN Value in next row Resistant      SENSITIVE<=0.25    CEFTRIAXONE Value in next row Resistant      SENSITIVE<=0.25    CIPROFLOXACIN Value in next row Resistant      SENSITIVE<=0.25    GENTAMICIN Value in next row Sensitive      SENSITIVE<=0.25    IMIPENEM Value in next row Resistant      SENSITIVE<=0.25    TRIMETH/SULFA Value in next row Resistant      SENSITIVE<=0.25    PIP/TAZO Value in next row Resistant      RESISTANT>=128    *  LIGHT GROWTH KLEBSIELLA PNEUMONIAE   Proteus mirabilis - MIC*    AMPICILLIN Value in next row Resistant      RESISTANT>=128    CEFTAZIDIME Value in next row Sensitive      RESISTANT>=128    CEFAZOLIN Value in next row Sensitive      RESISTANT>=128    CEFTRIAXONE Value in next row Sensitive      RESISTANT>=128    CIPROFLOXACIN Value in next row Sensitive      RESISTANT>=128    GENTAMICIN Value in next row Sensitive      RESISTANT>=128    IMIPENEM Value in next row Sensitive      RESISTANT>=128    TRIMETH/SULFA Value in next row Sensitive      RESISTANT>=128    PIP/TAZO Value in next row Sensitive      SENSITIVE<=4    * MODERATE GROWTH PROTEUS MIRABILIS   Enterococcus gallinarum - MIC*    AMPICILLIN Value in next row Resistant      SENSITIVE<=4    GENTAMICIN SYNERGY Value in next row Sensitive      SENSITIVE<=4    CIPROFLOXACIN Value in next row Resistant      RESISTANT>=8    TETRACYCLINE Value in next row Resistant      RESISTANT>=16    * MODERATE GROWTH ENTEROCOCCUS GALLINARUM  Wound culture     Status: None   Collection Time: 08/15/15  2:53 PM  Result Value Ref Range Status   Specimen Description WOUND  Final   Special Requests NONE  Final   Gram Stain FEW WBC SEEN NO ORGANISMS SEEN   Final   Culture NO GROWTH 3 DAYS  Final   Report Status 08/18/2015 FINAL  Final  C difficile quick scan w PCR reflex     Status: None   Collection Time: 08/17/15 11:34 AM  Result Value Ref Range Status   C Diff antigen NEGATIVE NEGATIVE Final   C Diff toxin NEGATIVE NEGATIVE Final   C Diff interpretation Negative for C. difficile  Final  Stool culture     Status: None   Collection Time: 09/03/15  3:51 PM  Result Value Ref Range Status   Specimen Description STOOL  Final   Special Requests Immunocompromised  Final   Culture   Final    NO SALMONELLA OR SHIGELLA ISOLATED No Pathogenic E. coli detected NO CAMPYLOBACTER DETECTED    Report Status 09/07/2015 FINAL  Final     Coagulation Studies: No results for input(s): LABPROT, INR in the last 72 hours.  Urinalysis: No results for input(s): COLORURINE, LABSPEC, PHURINE, GLUCOSEU, HGBUR, BILIRUBINUR, KETONESUR, PROTEINUR, UROBILINOGEN, NITRITE, LEUKOCYTESUR in the last 72 hours.  Invalid input(s): APPERANCEUR    Imaging: No results found.   Medications:   . norepinephrine Stopped (09/10/15 0932)   . acidophilus  1 capsule Oral Daily  . amantadine  200 mg Oral Q7 days  . antiseptic oral rinse  7 mL Mouth Rinse q12n4p  . chlorhexidine  15 mL Mouth Rinse BID  . clindamycin (CLEOCIN) IV  600 mg Intravenous 3 times per day  . epoetin (EPOGEN/PROCRIT) injection  10,000 Units Intravenous Q M,W,F-HD  . feeding supplement (JEVITY 1.2 CAL)  1,000 mL Per Tube Q24H  . feeding supplement (PRO-STAT SUGAR FREE 64)  30 mL Per Tube 6 times per day  . free water  200 mL Per Tube 3 times per day  . heparin subcutaneous  5,000 Units Subcutaneous Q12H  . hydrocortisone  10 mg Per Tube BID  . lidocaine  1 patch Transdermal Q24H  . megestrol  400 mg Per Tube Daily  . midodrine  10 mg Oral TID WC  . multivitamin  5 mL Per Tube Daily  . nystatin   Topical TID  . pantoprazole sodium  40 mg Per Tube Q1200  . sodium chloride  10-40 mL Intracatheter Q12H  . sodium hypochlorite   Irrigation BID   sodium chloride, sodium chloride, sodium chloride, sodium chloride, acetaminophen (TYLENOL) oral liquid 160 mg/5 mL, ipratropium-albuterol, loperamide, morphine injection, oxyCODONE-acetaminophen, sodium chloride  Assessment/ Plan:  49 y.o. 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 End-stage renal disease on hemodialysis on HD MWF. The patient has been on dialysis since October of 2014. R IJ permcath.  -  Patient did not tolerate dialysis today due to intradialytic hypotension which did not respond to usual measures , therefore was transferred to the ICU - She was dialyzed in the ICU yesterday with 5 mics of Levophed to maintain map of 65 - Appreciate cardiology eval suggesting  LVH and small LV cavity. - Will need to keep dialysis BFR slow (along the lines of CRRT), also consider sodium profile and use of IV albumin - Has been requiring low dose of Levophed during dialysis - plan for next HD on Monday   2. Anemia of CKD: status post PRBC transfusions. Hgb down to 8.1 - EPO with HD   3. Secondary hyperparathyroidism: PTH low at 55,  . - no indication for binders or activated vitamin D at this point    4. Hypotension: required vasopressors earlier in admission. - continue midodrine  po tid.  We have also been giving PRN albumin to keep BP up during HD, but despite this UF has been limited by hypotension.  5.  Dispo:  Unclear at this time. Has to be able to sit in chair for the duration of treatment prior to outpatient discharge Otherwise may need to look into stretcher dialysis facilities Overall prognosis is extremely poor   LOS: 30 Ladarien Beeks 10/23/201612:05 PM

## 2015-09-11 NOTE — Progress Notes (Signed)
PT recommended SNF.  [facility].  She  is able to return once medically stable and able to meet their admission criteria.     CSW team will continue to follow patient for disposition needs.   Sammuel Hineseborah Nura Cahoon. Theresia MajorsLCSWA, MSW Clinical Social Work Department [phone] 9:59 AM

## 2015-09-12 ENCOUNTER — Inpatient Hospital Stay: Payer: 59

## 2015-09-12 DIAGNOSIS — I639 Cerebral infarction, unspecified: Secondary | ICD-10-CM

## 2015-09-12 DIAGNOSIS — J9610 Chronic respiratory failure, unspecified whether with hypoxia or hypercapnia: Secondary | ICD-10-CM

## 2015-09-12 DIAGNOSIS — J690 Pneumonitis due to inhalation of food and vomit: Secondary | ICD-10-CM

## 2015-09-12 LAB — COMPREHENSIVE METABOLIC PANEL
ALT: 10 U/L — AB (ref 14–54)
AST: 16 U/L (ref 15–41)
Albumin: 1.4 g/dL — ABNORMAL LOW (ref 3.5–5.0)
Alkaline Phosphatase: 968 U/L — ABNORMAL HIGH (ref 38–126)
Anion gap: 7 (ref 5–15)
BILIRUBIN TOTAL: 1 mg/dL (ref 0.3–1.2)
BUN: 72 mg/dL — AB (ref 6–20)
CO2: 31 mmol/L (ref 22–32)
CREATININE: 1.6 mg/dL — AB (ref 0.44–1.00)
Calcium: 7.8 mg/dL — ABNORMAL LOW (ref 8.9–10.3)
Chloride: 100 mmol/L — ABNORMAL LOW (ref 101–111)
GFR, EST AFRICAN AMERICAN: 42 mL/min — AB (ref >60)
GFR, EST NON AFRICAN AMERICAN: 37 mL/min — AB (ref >60)
Glucose, Bld: 132 mg/dL — ABNORMAL HIGH (ref 65–99)
POTASSIUM: 3.9 mmol/L (ref 3.5–5.1)
Sodium: 138 mmol/L (ref 135–145)
TOTAL PROTEIN: 4.2 g/dL — AB (ref 6.5–8.1)

## 2015-09-12 LAB — CBC
HEMATOCRIT: 24.5 % — AB (ref 35.0–47.0)
Hemoglobin: 7.7 g/dL — ABNORMAL LOW (ref 12.0–16.0)
MCH: 30.3 pg (ref 26.0–34.0)
MCHC: 31.4 g/dL — ABNORMAL LOW (ref 32.0–36.0)
MCV: 96.4 fL (ref 80.0–100.0)
Platelets: 198 10*3/uL (ref 150–440)
RBC: 2.54 MIL/uL — ABNORMAL LOW (ref 3.80–5.20)
RDW: 21.8 % — AB (ref 11.5–14.5)
WBC: 13.4 10*3/uL — AB (ref 3.6–11.0)

## 2015-09-12 LAB — GLUCOSE, CAPILLARY
GLUCOSE-CAPILLARY: 112 mg/dL — AB (ref 65–99)
GLUCOSE-CAPILLARY: 125 mg/dL — AB (ref 65–99)
Glucose-Capillary: 122 mg/dL — ABNORMAL HIGH (ref 65–99)
Glucose-Capillary: 120 mg/dL — ABNORMAL HIGH (ref 65–99)
Glucose-Capillary: 107 mg/dL — ABNORMAL HIGH (ref 65–99)

## 2015-09-12 LAB — PHOSPHORUS: PHOSPHORUS: 4.5 mg/dL (ref 2.5–4.6)

## 2015-09-12 MED ORDER — HEPARIN SODIUM (PORCINE) 1000 UNIT/ML IJ SOLN
1000.0000 [IU] | Freq: Once | INTRAMUSCULAR | Status: AC
  Administered 2015-09-12: 4500 [IU]

## 2015-09-12 MED ORDER — JEVITY 1.2 CAL PO LIQD
1000.0000 mL | ORAL | Status: DC
  Administered 2015-09-12 – 2015-09-13 (×2): 1000 mL
  Filled 2015-09-12: qty 1000

## 2015-09-12 MED ORDER — FLUOXETINE HCL 20 MG/5ML PO SOLN
10.0000 mg | Freq: Every day | ORAL | Status: DC
  Administered 2015-09-12 – 2015-09-17 (×6): 10 mg via ORAL
  Filled 2015-09-12 (×8): qty 5

## 2015-09-12 NOTE — Progress Notes (Signed)
Speech Language Pathology Treatment: Hillary BowPassy Muir Speaking valve;Dysphagia  Patient Details Name: Courtney GrinderHedda McMillian [name] MRN: [id number] DOB: 19-Aug-1966 Today's Date: 09/12/2015 Time: [id number] SLP Time Calculation (min) (ACUTE ONLY): 55 min  Assessment / Plan / Recommendation Clinical Impression  Pt's vital signs remained at baseline when PMV placed by ST (HR:107, O2:100). Pt was looking around the room, eyes open until ST placed PMV -- at which point Pt disengaged, closed eyes and did not open them again during the visit.  Pt not engaged or attentive to task.  Pt took 2-3 half sized tsp of nectar, but shook head "no" when asked by ST if she'd like more.  Pt clenched mouth shut and would not accept further PO trials. Pt appeared to tolerate POs, however assessment was limited -- recommend continued assessment to evaluate toleration of diet. Per MD, Pt to remain NPO for one more day (as she begins antidepressants). NSG updated.   HPI Other Pertinent Information: Pt is a 89 F who has been in medical facilities (hosp, LTAC, rehab) for 2 yrs following gastric bypass surgery with multiple complications. Now with chronic trach, ESRD, profound debilitation, severe sacral pressure ulcer. Was seemingly making progress and transferred to rehab facility approx one week prior to this admission. [facility] with [facility] and hypotension. Working dx of severe sepsis/septic shock due to infected sacral pressure ulcer. Also has R side externalized HD cath and tunneled L IJ CVL. She has history of resistant bacterial infections. She is on chronic steroids. Her husband indicates that she has been diagnosed with adrenal insuff at some point during the past 2 yrs. 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 Upon discussion w/ husband, pt was eating an oral diet at [employer] WITH the trach Capped; tolerated this adequately w/ no deficits. Pt has been responsive to verbal stim and  following a few basic commands such as stick out tongue w/ Neurologist. 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 Pt has passed the PMV eval w/ f/u tx session now but requires 100% supervision when wearing the valve. She has only phonated x2 during PMV use w/ SLP; no decline in O2 sats or discomfort noted. Pt currently has an NG tube in place for TFs for nutrition. [name] wants the NG tube to remain in place w/ TFs at this time while po diet begins. Dietician monitoring amounts.   Pertinent Vitals Pain Assessment: No/denies pain  SLP Plan  Continue with current plan of care    Recommendations Diet recommendations: NPO (per MD (for one day until antidepressants given)) Liquids provided via: Teaspoon Medication Administration: Crushed with puree Supervision: Staff to assist with self feeding;Full supervision/cueing for compensatory strategies;Trained caregiver to feed patient Compensations: Minimize environmental distractions;Slow rate;Small sips/bites Postural Changes and/or Swallow Maneuvers: Seated upright 90 degrees      Patient may use Passy-Muir Speech Valve: with SLP only;Caregiver trained to provide supervision PMSV Supervision: Full       Oral Care Recommendations: Oral care QID;Staff/trained caregiver to provide oral care Follow up Recommendations: Skilled Nursing facility Plan: Continue with current plan of care    GO     Resha Filippone 09/12/2015, 2:26 PM

## 2015-09-12 NOTE — Consult Note (Signed)
   SUBJECTIVE: i was asked to evaluate the patient again for TEE.    Filed Vitals:   09/12/15 0400 09/12/15 0419 09/12/15 0511 09/12/15 0600  BP: 147/72   148/59  Pulse: 102   104  Temp:      TempSrc:      Resp: 22   22  Height:      Weight:  187 lb 2.7 oz (84.9 kg)    SpO2: 97%  97% 100%    Intake/Output Summary (Last 24 hours) at 09/12/15 0936 Last data filed at 09/12/15 [id number]  Gross per 24 hour  Intake   1250 ml  Output      0 ml  Net   1250 ml    LABS: Basic Metabolic Panel:  Recent Labs  96/02/5409/24/16 0450  NA 138  K 3.9  CL 100*  CO2 31  GLUCOSE 132*  BUN 72*  CREATININE 1.60*  CALCIUM 7.8*  PHOS 4.5   Liver Function Tests:  Recent Labs  09/12/15 0450  AST 16  ALT 10*  ALKPHOS 968*  BILITOT 1.0  PROT 4.2*  ALBUMIN 1.4*   No results for input(s): LIPASE, AMYLASE in the last 72 hours. CBC:  Recent Labs  09/11/15 0522 09/12/15 0450  WBC 12.6* 13.4*  HGB 7.4* 7.7*  HCT 24.3* 24.5*  MCV 96.2 96.4  PLT 179 198   Cardiac Enzymes: No results for input(s): CKTOTAL, CKMB, CKMBINDEX, TROPONINI in the last 72 hours. BNP: Invalid input(s): POCBNP D-Dimer: No results for input(s): DDIMER in the last 72 hours. Hemoglobin A1C: No results for input(s): HGBA1C in the last 72 hours. Fasting Lipid Panel: No results for input(s): CHOL, HDL, LDLCALC, TRIG, CHOLHDL, LDLDIRECT in the last 72 hours. Thyroid Function Tests: No results for input(s): TSH, T4TOTAL, T3FREE, THYROIDAB in the last 72 hours.  Invalid input(s): FREET3 Anemia Panel: No results for input(s): VITAMINB12, FOLATE, FERRITIN, TIBC, IRON, RETICCTPCT in the last 72 hours.   PHYSICAL EXAM General: critically ill , well nourished, trached.  HEENT:  Normocephalic and atramatic Neck:  No JVD.  Lungs: Clear bilaterally to auscultation and percussion. Heart: HRRR . Normal S1 and S2 without gallops or murmurs. tachycardiac Abdomen: Bowel sounds are positive, abdomen soft and non-tender  Msk:   Back normal, normal gait. Normal strength and tone for age. Extremities: No clubbing, cyanosis or edema.   Neuro: sedated   TELEMETRY: Reviewed telemetry pt in sinus tachycardia  ASSESSMENT AND PLAN:  I was asked to evaluate the patient again for TEE to exclude cardiac thrombus/vegetation. She continues to have multiple issues including severe anemia which makes her a poor candidate for anticoagulation.  At the present time considering her co morbidities, I would only recommend TEE if there is a strong clinical indication that is going to change her management. At the present time, I don't think the risk/benefit ratio favors TEE. Please call us if the clinical situation changes.   Lorine BearsMuhammad Arida, MD, [facility] 09/12/2015 9:36 AM

## 2015-09-12 NOTE — Progress Notes (Signed)
Subjective:  Pt resting at bedside. Head of the bed is elevated. Trach in place, PMV not in place at the moment. Due for HD again today, shes been switched to MWF schedule. Is arousable this AM.     Objective:  Vital signs in last 24 hours:  Temp:  [98.9 F (37.2 C)-99.1 F (37.3 C)] 98.9 F (37.2 C) (10/23 2000) Pulse Rate:  [96-116] 104 (10/24 0600) Resp:  [0-31] 22 (10/24 0600) BP: (128-160)/(59-105) 148/59 mmHg (10/24 0600) SpO2:  [96 %-100 %] 100 % (10/24 0600) FiO2 (%):  [28 %-40 %] 28 % (10/24 0908) Weight:  [84.9 kg (187 lb 2.7 oz)] 84.9 kg (187 lb 2.7 oz) (10/24 0419)  Weight change: 0.3 kg (10.6 oz) Filed Weights   09/10/15 0401 09/11/15 0448 09/12/15 0419  Weight: 84 kg (185 lb 3 oz) 84.6 kg (186 lb 8.2 oz) 84.9 kg (187 lb 2.7 oz)    Intake/Output: I/O last 3 completed shifts: In: 2040 [P.O.:150; NG/GT:1690; IV Piggyback:200] Out: -    Intake/Output this shift:     Physical Exam: General: No acute distress  Head: [place]man/AT eyes open NG in place  Eyes: anicteric  Neck: Trach in place  Lungs:  Scattered rhonchi, normal effort  Heart: S1S2 no rubs  Abdomen:  Soft, nontender, BS present  Extremities:  + dependent edema,   Neurologic:  arousable this AM.     Access: R IJ permcath    Basic Metabolic Panel:  Recent Labs Lab 09/08/15 1945 09/12/15 0450  NA 136 138  K 3.5 3.9  CL 98* 100*  CO2 32 31  GLUCOSE 121* 132*  BUN 55* 72*  CREATININE 1.31* 1.60*  CALCIUM 7.5* 7.8*  PHOS  --  4.5    Liver Function Tests:  Recent Labs Lab 09/12/15 0450  AST 16  ALT 10*  ALKPHOS 968*  BILITOT 1.0  PROT 4.2*  ALBUMIN 1.4*   No results for input(s): LIPASE, AMYLASE in the last 168 hours. No results for input(s): AMMONIA in the last 168 hours.  CBC:  Recent Labs Lab 09/09/15 0535 09/11/15 0522 09/12/15 0450  WBC 14.7* 12.6* 13.4*  HGB 8.1* 7.4* 7.7*  HCT 25.5* 24.3* 24.5*  MCV 96.4 96.2 96.4  PLT 206 179 198    Cardiac Enzymes: No  results for input(s): CKTOTAL, CKMB, CKMBINDEX, TROPONINI in the last 168 hours.  BNP: Invalid input(s): POCBNP  CBG:  Recent Labs Lab 09/11/15 0005 09/11/15 0753 09/11/15 1710 09/12/15 0047 09/12/15 0719  GLUCAP 100* 110* 128* 112* 125*    Microbiology: Results for orders placed or performed during the hospital encounter of Aug 20, 2015  Blood Culture (routine x 2)     Status: None   Collection Time: 08/20/2015  8:51 AM  Result Value Ref Range Status   Specimen Description BLOOD [name]  Final   Special Requests BOTTLES DRAWN AEROBIC AND ANAEROBIC  3CC  Final   Culture NO GROWTH 5 DAYS  Final   Report Status 08/17/2015 FINAL  Final  Blood Culture (routine x 2)     Status: None   Collection Time: 20-Aug-2015  9:20 AM  Result Value Ref Range Status   Specimen Description BLOOD LEFT ARM  Final   Special Requests BOTTLES DRAWN AEROBIC AND ANAEROBIC  1CC  Final   Culture NO GROWTH 5 DAYS  Final   Report Status 08/17/2015 FINAL  Final  Wound culture     Status: None   Collection Time: 08-20-2015  9:20 AM  Result Value Ref  Range Status   Specimen Description DECUBITIS  Final   Special Requests Normal  Final   Gram Stain   Final    FEW WBC SEEN MANY GRAM NEGATIVE RODS RARE GRAM POSITIVE COCCI    Culture   Final    HEAVY GROWTH ESCHERICHIA COLI MODERATE GROWTH ENTEROBACTER AEROGENES PROTEUS MIRABILIS HEAVY GROWTH ENTEROCOCCUS SPECIES VRE HAVE INTRINSIC RESISTANCE TO MOST COMMONLY USED ANTIBIOTICS AND THE ABILITY TO ACQUIRE RESISTANCE TO MOST AVAILABLE ANTIBIOTICS.    Report Status 08/16/2015 FINAL  Final   Organism ID, Bacteria ESCHERICHIA COLI  Final   Organism ID, Bacteria ENTEROBACTER AEROGENES  Final   Organism ID, Bacteria PROTEUS MIRABILIS  Final   Organism ID, Bacteria ENTEROCOCCUS SPECIES  Final      Susceptibility   Enterobacter aerogenes - MIC*    CEFTAZIDIME <=1 SENSITIVE Sensitive     CEFAZOLIN >=64 RESISTANT Resistant     CEFTRIAXONE <=1 SENSITIVE Sensitive      CIPROFLOXACIN <=0.25 SENSITIVE Sensitive     GENTAMICIN <=1 SENSITIVE Sensitive     IMIPENEM 1 SENSITIVE Sensitive     TRIMETH/SULFA <=20 SENSITIVE Sensitive     * MODERATE GROWTH ENTEROBACTER AEROGENES   Escherichia coli - MIC*    AMPICILLIN <=2 SENSITIVE Sensitive     CEFTAZIDIME <=1 SENSITIVE Sensitive     CEFAZOLIN <=4 SENSITIVE Sensitive     CEFTRIAXONE <=1 SENSITIVE Sensitive     CIPROFLOXACIN <=0.25 SENSITIVE Sensitive     GENTAMICIN <=1 SENSITIVE Sensitive     IMIPENEM <=0.25 SENSITIVE Sensitive     TRIMETH/SULFA <=20 SENSITIVE Sensitive     * HEAVY GROWTH ESCHERICHIA COLI   Proteus mirabilis - MIC*    AMPICILLIN >=32 RESISTANT Resistant     CEFTAZIDIME <=1 SENSITIVE Sensitive     CEFAZOLIN 8 SENSITIVE Sensitive     CEFTRIAXONE <=1 SENSITIVE Sensitive     CIPROFLOXACIN <=0.25 SENSITIVE Sensitive     GENTAMICIN <=1 SENSITIVE Sensitive     IMIPENEM 1 SENSITIVE Sensitive     TRIMETH/SULFA <=20 SENSITIVE Sensitive     * PROTEUS MIRABILIS   Enterococcus species - MIC*    AMPICILLIN >=32 RESISTANT Resistant     VANCOMYCIN >=32 RESISTANT Resistant     GENTAMICIN SYNERGY SENSITIVE Sensitive     TETRACYCLINE Value in next row Resistant      RESISTANT>=16    * HEAVY GROWTH ENTEROCOCCUS SPECIES  MRSA PCR Screening     Status: None   Collection Time: 08-22-2015  2:38 PM  Result Value Ref Range Status   MRSA by PCR NEGATIVE NEGATIVE Final    Comment:        The GeneXpert MRSA Assay (FDA approved for NASAL specimens only), is one component of a comprehensive MRSA colonization surveillance program. It is not intended to diagnose MRSA infection nor to guide or monitor treatment for MRSA infections.   Blood culture (single)     Status: None   Collection Time: 08-22-15  3:36 PM  Result Value Ref Range Status   Specimen Description BLOOD RIGHT ASSIST CONTROL  Final   Special Requests BOTTLES DRAWN AEROBIC AND ANAEROBIC  Final   Culture NO GROWTH 5 DAYS  Final   Report  Status 08/17/2015 FINAL  Final  Wound culture     Status: None   Collection Time: 08/13/15 12:37 PM  Result Value Ref Range Status   Specimen Description WOUND  Final   Special Requests Normal  Final   Gram Stain   Final  FEW WBC SEEN TOO NUMEROUS TO COUNT GRAM NEGATIVE RODS FEW GRAM POSITIVE COCCI    Culture   Final    HEAVY GROWTH ESCHERICHIA COLI MODERATE GROWTH PROTEUS MIRABILIS LIGHT GROWTH KLEBSIELLA PNEUMONIAE MODERATE GROWTH ENTEROCOCCUS GALLINARUM CRITICAL RESULT CALLED TO, READ BACK BY AND VERIFIED WITH: [facility] BORBA AT 1042 08/16/15 DV    Report Status 08/17/2015 FINAL  Final   Organism ID, Bacteria ESCHERICHIA COLI  Final   Organism ID, Bacteria PROTEUS MIRABILIS  Final   Organism ID, Bacteria KLEBSIELLA PNEUMONIAE  Final   Organism ID, Bacteria ENTEROCOCCUS GALLINARUM  Final      Susceptibility   Escherichia coli - MIC*    AMPICILLIN >=32 RESISTANT Resistant     CEFTAZIDIME 4 RESISTANT Resistant     CEFAZOLIN >=64 RESISTANT Resistant     CEFTRIAXONE 16 RESISTANT Resistant     GENTAMICIN 2 SENSITIVE Sensitive     IMIPENEM >=16 RESISTANT Resistant     TRIMETH/SULFA <=20 SENSITIVE Sensitive     Extended ESBL POSITIVE Resistant     PIP/TAZO Value in next row Resistant      RESISTANT>=128    CIPROFLOXACIN Value in next row Sensitive      SENSITIVE<=0.25    * HEAVY GROWTH ESCHERICHIA COLI   Klebsiella pneumoniae - MIC*    AMPICILLIN Value in next row Resistant      SENSITIVE<=0.25    CEFTAZIDIME Value in next row Resistant      SENSITIVE<=0.25    CEFAZOLIN Value in next row Resistant      SENSITIVE<=0.25    CEFTRIAXONE Value in next row Resistant      SENSITIVE<=0.25    CIPROFLOXACIN Value in next row Resistant      SENSITIVE<=0.25    GENTAMICIN Value in next row Sensitive      SENSITIVE<=0.25    IMIPENEM Value in next row Resistant      SENSITIVE<=0.25    TRIMETH/SULFA Value in next row Resistant      SENSITIVE<=0.25    PIP/TAZO Value in next row  Resistant      RESISTANT>=128    * LIGHT GROWTH KLEBSIELLA PNEUMONIAE   Proteus mirabilis - MIC*    AMPICILLIN Value in next row Resistant      RESISTANT>=128    CEFTAZIDIME Value in next row Sensitive      RESISTANT>=128    CEFAZOLIN Value in next row Sensitive      RESISTANT>=128    CEFTRIAXONE Value in next row Sensitive      RESISTANT>=128    CIPROFLOXACIN Value in next row Sensitive      RESISTANT>=128    GENTAMICIN Value in next row Sensitive      RESISTANT>=128    IMIPENEM Value in next row Sensitive      RESISTANT>=128    TRIMETH/SULFA Value in next row Sensitive      RESISTANT>=128    PIP/TAZO Value in next row Sensitive      SENSITIVE<=4    * MODERATE GROWTH PROTEUS MIRABILIS   Enterococcus gallinarum - MIC*    AMPICILLIN Value in next row Resistant      SENSITIVE<=4    GENTAMICIN SYNERGY Value in next row Sensitive      SENSITIVE<=4    CIPROFLOXACIN Value in next row Resistant      RESISTANT>=8    TETRACYCLINE Value in next row Resistant      RESISTANT>=16    * MODERATE GROWTH ENTEROCOCCUS GALLINARUM  Wound culture     Status: None  Collection Time: 08/15/15  2:53 PM  Result Value Ref Range Status   Specimen Description WOUND  Final   Special Requests NONE  Final   Gram Stain FEW WBC SEEN NO ORGANISMS SEEN   Final   Culture NO GROWTH 3 DAYS  Final   Report Status 08/18/2015 FINAL  Final  C difficile quick scan w PCR reflex     Status: None   Collection Time: 08/17/15 11:34 AM  Result Value Ref Range Status   C Diff antigen NEGATIVE NEGATIVE Final   C Diff toxin NEGATIVE NEGATIVE Final   C Diff interpretation Negative for C. difficile  Final  Stool culture     Status: None   Collection Time: 09/03/15  3:51 PM  Result Value Ref Range Status   Specimen Description STOOL  Final   Special Requests Immunocompromised  Final   Culture   Final    NO SALMONELLA OR SHIGELLA ISOLATED No Pathogenic E. coli detected NO CAMPYLOBACTER DETECTED    Report Status  09/07/2015 FINAL  Final    Coagulation Studies: No results for input(s): LABPROT, INR in the last 72 hours.  Urinalysis: No results for input(s): COLORURINE, LABSPEC, PHURINE, GLUCOSEU, HGBUR, BILIRUBINUR, KETONESUR, PROTEINUR, UROBILINOGEN, NITRITE, LEUKOCYTESUR in the last 72 hours.  Invalid input(s): APPERANCEUR    Imaging: Dg Chest Port 1 View  09/12/2015  CLINICAL DATA:  Respiratory failure EXAM: PORTABLE CHEST 1 VIEW COMPARISON:  09/07/2015 FINDINGS: The ET tube tip is above the carina. Left IJ catheter tip is in the SVC. There is a right sided dialysis catheter with tips in the right atrium. A feeding tube is noted with tip below the GE junction. Stable cardiac enlargement and small bilateral pleural effusions. IMPRESSION: 1. Stable support apparatus. 2. Cardiac enlargement and small bilateral pleural effusions. Electronically Signed   By: Signa Kellaylor  Stroud M.D.   On: 09/12/2015 09:30     Medications:   . norepinephrine Stopped (09/10/15 0932)   . acidophilus  1 capsule Oral Daily  . albumin human  12.5 g Intravenous Once  . amantadine  200 mg Oral Q7 days  . antiseptic oral rinse  7 mL Mouth Rinse q12n4p  . chlorhexidine  15 mL Mouth Rinse BID  . clindamycin (CLEOCIN) IV  600 mg Intravenous 3 times per day  . epoetin (EPOGEN/PROCRIT) injection  10,000 Units Intravenous Q M,W,F-HD  . feeding supplement (JEVITY 1.2 CAL)  1,000 mL Per Tube Q24H  . feeding supplement (PRO-STAT SUGAR FREE 64)  30 mL Per Tube 6 times per day  . free water  200 mL Per Tube 3 times per day  . heparin subcutaneous  5,000 Units Subcutaneous Q12H  . hydrocortisone  10 mg Per Tube BID  . lidocaine  1 patch Transdermal Q24H  . megestrol  400 mg Per Tube Daily  . midodrine  10 mg Oral TID WC  . multivitamin  5 mL Per Tube Daily  . nystatin   Topical TID  . pantoprazole sodium  40 mg Per Tube Q1200  . sodium chloride  10-40 mL Intracatheter Q12H  . sodium hypochlorite   Irrigation BID   sodium  chloride, sodium chloride, sodium chloride, sodium chloride, acetaminophen (TYLENOL) oral liquid 160 mg/5 mL, ipratropium-albuterol, loperamide, morphine injection, oxyCODONE-acetaminophen, sodium chloride  Assessment/ Plan:  49 y.o. 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 End-stage renal disease on hemodialysis on HD MWF. The patient has been on dialysis since October of 2014. R IJ permcath.  -  Hasn't tolerated dialysis all that well recently. -  Will need to continue to employ albumin PRN, midodrine, low BFRs, and use of pressors as needed. - It has become more and more difficult over time to effectively dialyze the pt.   2. Anemia of CKD: pt continues to require periodic blood transfusions, epo hasn't been as effective as hoped likely due to chronic inflammation from decubitus ulcer/infection etc.  3. Secondary hyperparathyroidism: PTH low at 55 - continue to periodically monitor bone mineral metabolism parameters    4. Hypotension: required vasopressors earlier in admission. - prominent during HD, continue pressors prn, midodrine, albumin with HD, low BFRs during HD.  5.  Dispo:  Unclear at this time. Has to be able to sit in chair for the duration of treatment prior to outpatient discharge Otherwise may need to look into stretcher dialysis facilities Overall prognosis is extremely poor   LOS: 31 Courtney [name],  Demyan Fugate 10/24/201610:18 AM

## 2015-09-12 NOTE — Progress Notes (Signed)
[facility] Progess Note  Name: Courtney [name] MRN: [id number] DOB: 12-06-1965    ADMISSION DATE:  08-13-2015   INTERVAL HISTORY:  10/24-off vasopressors, started prozac today, HD this PM and assess BP and vasopressors needs Follow CBC, repeat CXR pending, plan for Speech for small oral feeds, increased TF rate to 40  10/23- off vasopressors, 1 soft BM overnight, low H/H this AM, husband at bedside.   10/22 - off vasopressors, tolerated slow HD yesterday, tracking with eyes, and will nod head to answer questions. Sacral decub debrided at bedside yesterday. Restarted on levophed during dialysis, stopped this AM.   10/21-off vasopressors s/p wound debridement at bedside by gen surgery Morphine given for severe pain, plan for slow HD today and assess BP Continue step down monitoring, plan for TEE next week will discuss with Husband  10/20-transferred to step down for sepsis/aspiration pneumonia, weaned off levophed this AM -abx adjusted, continue TF's, will NOT start oral feeds Plan for TEE at some point, appreciate surgical input, obtain CBC,chem 7, CXR as needed Will update husband today  10/19-patient with minimal amounts of food this AM, will plan to continue oral intake along with Tube feedings and assess calorie count This was discussed with Husband, plan to stop TF's tomorrow if she tolerates oral feeds and remove NG tube in next 2 days Will talk and discuss with Husband that patient is very high risk for aspiration-will obtain Surgery consult to reassess decub ulcer Will consult cardiology to assess for TEE  10/18 discussed with husband-patient passed swallow eval spoke with Speech Therapist Will add Pureed thick nectar foods and continue Enteral feeds through NG tube.  Will add Megace   On 10/16  the patient's husband was upset due to placement of a rectal tube by on-call physician, this was removed. It was also noted that the pt  developed diarrhea, therefore Jevity was changed from 1.5 with fiber to Jevity 1.2; the rate was decreased to 20 cc/hr and the pt was given a dose of lomotil, which has helped the diarrhea. It was also sent for stool culture and Cdif.   On 10/17 discussed with husband options going forward. He would like to transfer to [facility] as it is closer to him, also  [facility] is there so he can have his wife evaluated better and that he can have the [employer] come and check on her status personally-Dr. Belia Heman has discussed case with Hospitalist and they pretty much have refused to accept her at this time.   -He declines transfer of his wife back to [place].   -He would like to repeat speech evaluation and Pasey-Muir valve as he hopes that his wife will be able to eat and then will not need a feeding tube. He understands that she may be "stuck" in the hospital as currently no local LTAC is accepting her. He is continuing discussions with [facility].   -He would like his wife restarted on aspirin due to stroke risk. It was explained that given her life threatening bleed the rounding physician did not feel comfortable restarting this medication at this time, but he could revisit this discussion with other physicians in the future.    INITIAL PRESENTATION:  64 F who has been in medical facilities (hosp, LTAC, rehab) for 2 yrs following gastric bypass surgery with multiple complications. Now with chronic trach, ESRD, profound debilitation, severe sacral pressure ulcer. Was seemingly making progress and transferred to rehab facility approx one week prior to  this admission. She [facility] ED with AMS and hypotension. Working dx of severe sepsis/septic shock due to infected sacral pressure ulcer. Also has R side externalized HD cath and tunneled L IJ CVL. She has history of resistant bacterial infections. She is on chronic steroids. Her husband indicates that she has been diagnosed with adrenal insuff at some point during the  past 2 yrs  MAJOR EVENTS/TEST RESULTS: Admission 02/07/14-05/07/14 Admission 07/21/14-09/06/14 Discharged to [facility]. Pt had palliative consult at that time, were asked to sign off by husband.  09/23 CT head: NAD 09/23 EEG: no epileptiform activity 09/23 PRBCs for Hgb 6.4 09/24 bedside debridement of sacral wound. Abscess drained 09/25 Off vasopressors. More alert. No distress. Worsening thrombocytopenia. Vanc DC'd 09/29  Dr. [name] (I.D) excused from the case by patient's husband. 10/02 MRI -multiple infarcts 10/03 tracheal bleeding- transfused platelets 10/03 hospitalist service excused from the case by patient's husband 10/03 Echocardiogram ejection fraction was 55-60%, pulmonary systolic pressure was 39 mmHg 40/9810/08 restart TF's at lower rate, attempt reg HD  10/12 Transferred to med-surg floor. 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 Pneumoniae, P. Mirabilis, VRE Wound 9/26 >> No growth Blood 9/23 >> neg CDiff 9/27>>neg  ANTIMICROBIALS:  Aztreonam 9/23 >> 9/24 Vanc 9/23 >> 9/25 Vanc 9/26>>9/27 Daptomycin 9/27>> 10/12 Meropenem 9/23 >> 10/14   SUBJECTIVE:  RASS 0. Nonverbal at baseline, squeeze left hand, was  able to track and follow with eyes. Responded to questions by nodding head.    VITAL SIGNS: Temp:  [98.9 F (37.2 C)-99.1 F (37.3 C)] 98.9 F (37.2 C) (10/23 2000) Pulse Rate:  [96-116] 111 (10/24 1000) Resp:  [0-31] 16 (10/24 1000) BP: (128-160)/(59-105) 149/67 mmHg (10/24 1000) SpO2:  [96 %-100 %] 97 % (10/24 1000) FiO2 (%):  [28 %-40 %] 28 % (10/24 1000) Weight:  [187 lb 2.7 oz (84.9 kg)] 187 lb 2.7 oz (84.9 kg) (10/24 0419) HEMODYNAMICS:   VENTILATOR SETTINGS: Vent Mode:  [-]  FiO2 (%):  [28 %-40 %] 28 % INTAKE / OUTPUT:  Intake/Output Summary (Last 24 hours) at 09/12/15 1048 Last data filed at 09/12/15 [id number]  Gross per 24 hour  Intake   1220 ml  Output      0 ml  Net   1220 ml    PHYSICAL EXAMINATION: Physical Examination:   VS: BP 149/67 mmHg  Pulse 111  Temp(Src) 98.9 F (37.2 C) (Oral)  Resp 16  Ht 5\' 9"  (1.753 m)  Wt 187 lb 2.7 oz (84.9 kg)  BMI 27.63 kg/m2  SpO2 97%  General Appearance: No resp distress  Neuro: profoundly diffusely weak HEENT: cushingoid facies, PERRLA, EOM intact Neck: trach site clean Pulmonary: clear anteriorly Cardiovascular: reg, no M  Abdomen: soft, NT, +BS Extremities: warm, R>L UE edema    LABORATORY PANEL:   CBC  Recent Labs Lab 09/12/15 0450  WBC 13.4*  HGB 7.7*  HCT 24.5*  PLT 198    Chemistries   Recent Labs Lab 09/12/15 0450  NA 138  K 3.9  CL 100*  CO2 31  GLUCOSE 132*  BUN 72*  CREATININE 1.60*  CALCIUM 7.8*  PHOS 4.5  AST 16  ALT 10*  ALKPHOS 968*  BILITOT 1.0     CXR: nnf   IMPRESSION/PLAN: PULMONARY A: 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   CARDIOVASCULAR A:  Septic shock, resolved  Hemorrhagic shock,  resolved Chronic hypotension P:  MAP goal > 55 mmHg Cont midodrine -continue steroids  RENAL A:  ESRD  P:  Monitor BMET intermittently Monitor I/Os Correct electrolytes as indicated Intermittent HD per Renal Service  GASTROINTESTINAL A:  LGIB, resolved Hx of C. Diff Stool incontinence Dysphagia P:  Cont PPI Avoid anticoagulants  HEMATOLOGIC A:  Acute on chronic anemia Thrombocytopenia - HIT panel negative 9/25 P:  DVT px: SCDs, resume SQ heparin 10/14 Monitor CBC intermittently Transfuse per usual guidelines, Hb=7.4, recheck in the AM.  plt count 198 10/17 >>  206 on 10/21 >>   198 on 10/24  INFECTIOUS A:  Severe sepsis, resolved Sacral decubitus ulcer with abscess P:  Monitor temp, WBC count Micro and abx as above-off abx now -follow up gen surgery recs  ENDOCRINE A:  DM 2, controlled. 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 Profound deconditioning Chronic pain P: RASS goal: 0 Minimize sedating meds Changed analgesics to PRN oxycodone 10/13 Cont PT  -plan for TEE  SOCIAL/DISPO: [name] has been somewhat difficult and has fired ID and Hospitalist services We have sought transfer to [facility] but she has been turned down by [facility] and [facility] We are seeking return to [facility] but she cannot go there with NGT and husband has been reluctant to consent to G-tube placement Care management very involved 10/23 spoke with husband at bedside (25 mins), updated him on patient's clinical status.  I have personally obtained a history, examined the patient, evaluated Pertinent laboratory and RadioGraphic/imaging results, and  formulated the assessment and plan The Patient requires high complexity decision making for assessment and support, frequent  evaluation and titration of therapies. Will update Husband   Rolande Moe Santiago Glad, M.D.  [facility]  Medical Director [facility] Medical Director [facility] Cardio-Pulmonary Department

## 2015-09-12 NOTE — Progress Notes (Signed)
PT Cancellation Note  Patient Details Name: Lisette GrinderHedda McMillian Monroe MRN: [id number] DOB: 12/08/1965   Cancelled Treatment:    Reason Eval/Treat Not Completed: Patient declined, no reason specified (Treatment session attempted.  Patient briefly opened eyes to therapist voice, but immediately closed and did not open again.  Attempted to engage with therapeutic interventions--patient consistently shaking head "no", pulling L UE away from therapist and attempting to pull covers back over self.  Unable to redirect or promote participation despite max encouragement.  Will continue efforts at later time/date as patient medically appropriate and agreeable to participation.)   Luka Reisch H. Manson PasseyBrown, PT, DPT, NCS 09/12/2015, 3:36 PM [phone]

## 2015-09-12 NOTE — Progress Notes (Signed)
Nutrition Follow-up  DOCUMENTATION CODES:   Severe malnutrition in context of acute illness/injury  INTERVENTION:  Coordination of care: Discussed in ICU rounds this am with MD Kasa.  MD Kasa wanting to increase tube feeding of jevity 1.2 to 43ml/hr today. MD aware of goal rate of 66ml/hr with prostat times 6 to meet nutritional needs.     NUTRITION DIAGNOSIS:   Inadequate oral intake related to wound healing, acute illness, chronic illness as evidenced by NPO status, estimated needs.    GOAL:   Patient will meet greater than or equal to 90% of their needs    MONITOR:    (Energy Intake, Anthropometrics, Digestive System, Electrolyte/Renal Profile, Glucose Profile)  REASON FOR ASSESSMENT:   Consult Enteral/tube feeding initiation and management  ASSESSMENT:     Planning HD today, debridement of sacral wound on 10/22, off vasopressors  SLP worked with pt this am, noted placed PMV and pt took 2-3 tsp of nectar thick liquids and pt shook head no, clenched mouth shut  Current Nutrition: Tolerating jevity 1.2 at 72ml/hr via dobhoff per RN, Nia.   Gastrointestinal Profile: noted Loose BM documented this am, 1 BM documented on 10/23 and on  10/22 BM times 1  Scheduled Medications:  . acidophilus  1 capsule Oral Daily  . albumin human  12.5 g Intravenous Once  . amantadine  200 mg Oral Q7 days  . antiseptic oral rinse  7 mL Mouth Rinse q12n4p  . chlorhexidine  15 mL Mouth Rinse BID  . epoetin (EPOGEN/PROCRIT) injection  10,000 Units Intravenous Q M,W,F-HD  . feeding supplement (PRO-STAT SUGAR FREE 64)  30 mL Per Tube 6 times per day  . FLUoxetine  10 mg Oral Daily  . free water  200 mL Per Tube 3 times per day  . heparin subcutaneous  5,000 Units Subcutaneous Q12H  . hydrocortisone  10 mg Per Tube BID  . lidocaine  1 patch Transdermal Q24H  . megestrol  400 mg Per Tube Daily  . midodrine  10 mg Oral TID WC  . multivitamin  5 mL Per Tube Daily  . nystatin   Topical  TID  . pantoprazole sodium  40 mg Per Tube Q1200  . sodium chloride  10-40 mL Intracatheter Q12H  . sodium hypochlorite   Irrigation BID    Continuous Medications:  . feeding supplement (JEVITY 1.2 CAL)    . norepinephrine Stopped (09/10/15 0932)     Electrolyte/Renal Profile and Glucose Profile:   Recent Labs Lab 09/08/15 1945 09/12/15 0450  NA 136 138  K 3.5 3.9  CL 98* 100*  CO2 32 31  BUN 55* 72*  CREATININE 1.31* 1.60*  CALCIUM 7.5* 7.8*  PHOS  --  4.5  GLUCOSE 121* 132*   Protein Profile:  Recent Labs Lab 09/12/15 0450  ALBUMIN 1.4*     Weight Trend since Admission: Filed Weights   09/10/15 0401 09/11/15 0448 09/12/15 0419  Weight: 185 lb 3 oz (84 kg) 186 lb 8.2 oz (84.6 kg) 187 lb 2.7 oz (84.9 kg)      Diet Order:   NPO  Skin:   (stage IV sacrum, stage III hip, unstageable foot; rectal ulcer from fleixseal, ulcer in nose from dobhoff)   Height:   Ht Readings from Last 1 Encounters:  08/18/2015  (1.753 m)    Weight:   Wt Readings from Last 1 Encounters:  09/12/15 187 lb 2.7 oz (84.9 kg)    Ideal Body Weight:  BMI:  Body mass index is 27.63 kg/(m^2).  Estimated Nutritional Needs:   Kcal:  2175-2535 kcals (BEE 1509, 1.2 AF, 1.2-1.4 IF) or 2490-2905 kcals (30-35 kcals/kg)   Protein:  125-166 g (1.5-2.0 g/kg) but likely closer to 166-208 g (2.0-2.5 g/kg)   Fluid:  1000 mL plus UOP  EDUCATION NEEDS:   No education needs identified at this time  HIGH Care Level  Jeryn Bertoni B. Freida BusmanAllen, RD, LDN [phone] (pager)

## 2015-09-12 NOTE — Care Management (Signed)
Patient's tube feedings up to 40 cc/hr.  Cardiology recommends not proceeding with TEE unless it will change the plan of treatment/outcome.  OT has evaluated patient.  Surgery has no long range plans for sacral decubitus because as long as patient remains bedbound, not out of bed, and limited nutrition, further aggressive measures would be futile.  sacral decubitus.  Patient currently off pressors.  She continues to having difficulty tolerating dialysis treatments.  Informed that patient was out of bed in the chair over the weekend.

## 2015-09-13 DIAGNOSIS — L89154 Pressure ulcer of sacral region, stage 4: Secondary | ICD-10-CM

## 2015-09-13 DIAGNOSIS — J9612 Chronic respiratory failure with hypercapnia: Secondary | ICD-10-CM

## 2015-09-13 LAB — BLOOD GAS, ARTERIAL
Acid-Base Excess: 8.8 mmol/L — ABNORMAL HIGH (ref 0.0–3.0)
Bicarbonate: 34.1 mEq/L — ABNORMAL HIGH (ref 21.0–28.0)
FIO2: 0.3
O2 SAT: 97.4 %
PATIENT TEMPERATURE: 37
pCO2 arterial: 48 mmHg (ref 32.0–48.0)
pH, Arterial: 7.46 — ABNORMAL HIGH (ref 7.350–7.450)
pO2, Arterial: 90 mmHg (ref 83.0–108.0)

## 2015-09-13 LAB — GLUCOSE, CAPILLARY
GLUCOSE-CAPILLARY: 120 mg/dL — AB (ref 65–99)
Glucose-Capillary: 116 mg/dL — ABNORMAL HIGH (ref 65–99)

## 2015-09-13 LAB — C DIFFICILE QUICK SCREEN W PCR REFLEX
C DIFFICILE (CDIFF) INTERP: NEGATIVE
C DIFFICILE (CDIFF) TOXIN: NEGATIVE
C Diff antigen: NEGATIVE

## 2015-09-13 LAB — HEPATITIS B SURFACE ANTIGEN: HEP B S AG: NEGATIVE

## 2015-09-13 NOTE — Progress Notes (Signed)
Speech Language Pathology Treatment: Courtney [name] Speaking valve  Patient Details Name: Courtney [name] MRN: [id number] DOB: 06-Jul-1966 Today's Date: 09/13/2015 Time: 1324-4010 SLP Time Calculation (min) (ACUTE ONLY): 45 min  Assessment / Plan / Recommendation Clinical Impression  Pt was seen for PMV tolerance/tx this morning continuing to gage toleration for wear/use w/ po intake. Upon entering room, pt was awake w/ eyes opened and pt was smiling which she continued to do for several minutes. Pt smiled as SLP addressed her and educated her on the PMV tx session and use/wear but then did not smile or make any facial gestures to communicate. PMV was placed on trach w/out difficulty(pt has a cuffless trach; minimal to no secretions noted). Instructed pt to indicate to SLP if she felt any discomfort or difficulty w/ her breathing once PMV was placed. Pt's O2 sats were 97-100%, HR 113-115, RR upper teens to low 20s. After placing PMV, pt kept her eyes opened and appeared to be more engaged w/ SLP, although, she made no attempt to phonate or verbalize. Attempted automatic speech tasks to include counting 1-5, saying Days of Week, and singing songs(Happy Birthday) to further engage pt to communicate/phonate. Pt watched SLP but did not phonate or verbalize - though noted a slight head nod to the rhythm of the song. After several repetitions, pt appeared to shake her head side to side (slowly) in a "no" fashion. Asked pt several questions re: her toleration of the PMV and/or any discomfort; pt did not attempt to phonate or verbalize but did not exhibit any s/s of discomfort and no changes in O2 sats or HR noted. After a rest break, pt was asked about her Rosary and she seemed to smile slightly. Pt nodded her head "yes" to questions if it was special to her and if it were given to her by someone special.  Pt tolerated the PMV placement for ~35 mins. w/out any desat or discomfort noted. Due to pt's lack of  engagement and looking away from SLP as session continued, PMV was removed after ~35 mins. and pt allowed to rest. Pt appears to tolerate placement of the PMV and could do so w/ NSG being present during PMV use/wear - 100% supervision and to remove if any discomfort noted or if pt becomes sleepy. Pt would benefit from PMV wear/use in order to be able to use her voice for communication. NSG was consulted and the above was discussed and agreed; signs/precautions posted.    HPI Other Pertinent Information: Pt is a 39 F who has been in medical facilities (hosp, LTAC, rehab) for 2 yrs following gastric bypass surgery with multiple complications. Now with chronic trach, ESRD, profound debilitation, severe sacral pressure ulcer. Was seemingly making progress and transferred to rehab facility approx one week prior to this admission. She was sent to [facility] ED with AMS and hypotension. Working dx of severe sepsis/septic shock due to infected sacral pressure ulcer. Also has R side externalized HD cath and tunneled L IJ CVL. She has history of resistant bacterial infections. She is on chronic steroids. Her husband indicates that she has been diagnosed with adrenal insuff at some point during the past 2 yrs. 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 Upon discussion w/ husband, pt was eating an oral diet at [employer] WITH the trach Capped; tolerated this adequately w/ no deficits. Pt has been assessed w/ PMV 09/02/15 during this admission and passed. She has tolerated placement and wear  of PMV during tx sessions, primarily w/ dysphagia tx sessions. Pt has been responsive to verbal stim at times keeping her eyes opened and looking toward SLP but at other sessions, pt has closed her eyes appeared to shut down to any interactions. PMV precautions are posted at bedside; pt requires 100% monitoring during PMV wear.    Pertinent Vitals Pain Assessment: No/denies pain (no grimaces)  SLP Plan   Continue with current plan of care    Recommendations PMV recommendations:    PMV placement only when pt is fully awake/alert - not sleepy or drowsy; monitor for any increased tracheal secretions.   Patient may use Passy-Muir Speech Valve: with SLP only;Caregiver trained to provide supervision PMSV Supervision: Full MD: Please consider changing trach tube to :  (n/a)       General recommendations: Rehab consult (TBD) Oral Care Recommendations: Oral care QID;Staff/trained caregiver to provide oral care Follow up Recommendations: Skilled Nursing facility Plan: Continue with current plan of care    GO    Jerilynn SomKatherine Jailynne Opperman, MS, [name]  Daniil Labarge 09/13/2015, 2:19 PM

## 2015-09-13 NOTE — Progress Notes (Signed)
[facility] [place] Critical Care Medicine Progess Note  Name: Courtney [name] MRN: [id number] DOB: [birth year]    ADMISSION DATE:  08/26/2015   INTERVAL HISTORY: 10/25- off vasopressors, prozac started yesterday, BP stable overnight, abx stopped yesterday, seen by PT and speech tx yesterday, mild inc in wbc, recheck labs in the AM. Two loose stools yesterday, cdiff neg.  10/24-off vasopressors, started prozac today, HD this PM and assess BP and vasopressors needs Follow CBC, repeat CXR pending, plan for Speech for small oral feeds, increased TF rate to 40 (goal is 60), abx stopped for now,    10/23- off vasopressors, 1 soft BM overnight, low H/H this AM, husband at bedside.   10/22 - off vasopressors, tolerated slow HD yesterday, tracking with eyes, and will nod head to answer questions. Sacral decub debrided at bedside yesterday. Restarted on levophed during dialysis, stopped this AM.   10/21-off vasopressors s/p wound debridement at bedside by gen surgery Morphine given for severe pain, plan for slow HD today and assess BP Continue step down monitoring, plan for TEE next week will discuss with Husband  10/20-transferred to step down for sepsis/aspiration pneumonia, weaned off levophed this AM -abx adjusted, continue TF's, will NOT start oral feeds Plan for TEE at some point, appreciate surgical input, obtain CBC,chem 7, CXR as needed Will update husband today  10/19-patient with minimal amounts of food this AM, will plan to continue oral intake along with Tube feedings and assess calorie count This was discussed with Husband, plan to stop TF's tomorrow if she tolerates oral feeds and remove NG tube in next 2 days Will talk and discuss with Husband that patient is very high risk for aspiration-will obtain Surgery consult to reassess decub ulcer Will consult cardiology to assess for TEE  10/18 discussed with husband-patient passed swallow eval spoke with Speech Therapist Will  add Pureed thick nectar foods and continue Enteral feeds through NG tube.  Will add Megace   On 10/16  the patient's husband was upset due to placement of a rectal tube by on-call physician, this was removed. It was also noted that the pt developed diarrhea, therefore Jevity was changed from 1.5 with fiber to Jevity 1.2; the rate was decreased to 20 cc/hr and the pt was given a dose of lomotil, which has helped the diarrhea. It was also sent for stool culture and Cdif.   On 10/17 discussed with husband options going forward. He would like to transfer to [facility] as it is closer to him, also  [facility] is there so he can have his wife evaluated better and that he can have the [employer] come and check on her status personally-Dr. Belia Heman has discussed case with Hospitalist and they pretty much have refused to accept her at this time.   -He declines transfer of his wife back to [place].   -He would like to repeat speech evaluation and Pasey-Muir valve as he hopes that his wife will be able to eat and then will not need a feeding tube. He understands that she may be "stuck" in the hospital as currently no local LTAC is accepting her. He is continuing discussions with [facility].   -He would like his wife restarted on aspirin due to stroke risk. It was explained that given her life threatening bleed the rounding physician did not feel comfortable restarting this medication at this time, but he could revisit this discussion with other physicians in the future.    INITIAL PRESENTATION:  27 F who  has been in medical facilities (hosp, LTAC, rehab) for 2 yrs following gastric bypass surgery with multiple complications. Now with chronic trach, ESRD, profound debilitation, severe sacral pressure ulcer. Was seemingly making progress and transferred to rehab facility approx one week prior to this admission. She was sent to [facility] ED with AMS and hypotension. Working dx of severe sepsis/septic shock due to infected sacral pressure  ulcer. Also has R side externalized HD cath and tunneled L IJ CVL. She has history of resistant bacterial infections. She is on chronic steroids. Her husband indicates that she has been diagnosed with adrenal insuff at some point during the past 2 yrs  MAJOR EVENTS/TEST RESULTS: Admission 02/07/14-05/07/14 Admission 07/21/14-09/06/14 Discharged to [facility]. Pt had palliative consult at that time, were asked to sign off by husband.  09/23 CT head: NAD 09/23 EEG: no epileptiform activity 09/23 PRBCs for Hgb 6.4 09/24 bedside debridement of sacral wound. Abscess drained 09/25 Off vasopressors. More alert. No distress. Worsening thrombocytopenia. Vanc DC'd 09/29  Dr. Sampson Goon (I.D) excused from the case by patient's husband. 10/02 MRI -multiple infarcts 10/03 tracheal bleeding- transfused platelets 10/03 hospitalist service excused from the case by patient's husband 10/03 Echocardiogram ejection fraction was 55-60%, pulmonary systolic pressure was 39 mmHg 54/09 restart TF's at lower rate, attempt reg HD  10/12 Transferred to med-surg floor. 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 Pneumoniae, P. Mirabilis, VRE Wound 9/26 >> No  growth Blood 9/23 >> neg CDiff 9/27>>neg Stool Cx 10/15>> negative Cdiff 10/25>>neg  ANTIMICROBIALS:  Aztreonam 9/23 >> 9/24 Vanc 9/23 >> 9/25 Vanc 9/26>>9/27 Daptomycin 9/27>> 10/12 Meropenem 9/23 >> 10/14   SUBJECTIVE:  RASS 0. Nonverbal at baseline, squeeze left hand, was able to track and follow with eyes. Responded to questions by nodding head.  Speech and physical saw patient yesterday.   VITAL SIGNS: Temp:  [98 F (36.7 C)-99.1 F (37.3 C)] 98.2 F (36.8 C) (10/25 0000) Pulse Rate:  [99-112] 107 (10/25 0100) Resp:  [0-25] 16 (10/25 0100) BP: (99-149)/(34-85) 115/34 mmHg (10/25 0100) SpO2:  [94 %-100 %] 94 % (10/25 0818) FiO2 (%):  [28 %] 28 % (10/25 0818) Weight:  [187 lb 2.7 oz (84.9 kg)] 187 lb 2.7 oz (84.9 kg) (10/24 2100) HEMODYNAMICS:   VENTILATOR SETTINGS: Vent Mode:  [-]  FiO2 (%):  [28 %] 28 % INTAKE / OUTPUT:  Intake/Output Summary (Last 24 hours) at 09/13/15 0857 Last data filed at 09/13/15 0100  Gross per 24 hour  Intake    998 ml  Output    500 ml  Net    498 ml    PHYSICAL EXAMINATION: Physical Examination:   VS: BP 115/34 mmHg  Pulse 107  Temp(Src) 98.2 F (36.8 C) (Oral)  Resp 16  Ht  (1.753 m)  Wt 187 lb 2.7 oz (84.9 kg)  BMI 27.63 kg/m2  SpO2 94%  LMP  (LMP Unknown)  General Appearance: No resp distress, coarse upper airway sounds  Neuro: profoundly diffusely weak HEENT: cushingoid facies, PERRLA, EOM intact Neck: trach site clean Pulmonary: clear anteriorly Cardiovascular: reg, no M  Abdomen: soft, NT, +BS Extremities: warm, R>L UE edema    LABORATORY PANEL:   CBC  Recent Labs Lab 09/12/15 0450  WBC 13.4*  HGB 7.7*  HCT 24.5*  PLT 198    Chemistries   Recent Labs Lab 09/12/15 0450  NA 138  K 3.9  CL 100*  CO2 31  GLUCOSE 132*  BUN 72*  CREATININE 1.60*  CALCIUM 7.8*  PHOS 4.5  AST 16  ALT 10*  ALKPHOS 968*  BILITOT 1.0     CXR: nnf   IMPRESSION/PLAN: PULMONARY A: 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 Diff Stool incontinence Dysphagia P:  Cont PPI Avoid anticoagulants  HEMATOLOGIC A:  Acute on chronic anemia Thrombocytopenia - HIT panel negative 9/25 P:  DVT px: SCDs, resume SQ heparin 10/14 Monitor CBC intermittently Transfuse per usual guidelines, Hb=7.4, recheck in the AM.  plt count 198 10/17 >>  206 on 10/21 >>   198 on 10/24  INFECTIOUS A:  Severe sepsis, resolved Sacral decubitus ulcer with abscess P:  Monitor temp, WBC count Micro and abx as above-off abx now -follow up gen surgery recs  ENDOCRINE A:  DM 2, controlled. 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 Profound deconditioning Chronic pain P: RASS goal: 0 Minimize sedating meds Changed analgesics to PRN oxycodone 10/13 Cont PT  -plan for TEE  SOCIAL/DISPO: [name] has been somewhat difficult and has fired ID and Hospitalist services [facility] and [facility] but she cannot go there with NGT and husband has been reluctant to consent to G-tube placement Care  management very involved 10/25 spoke with husband over the phone, updated him on patient's clinical status.  I have personally obtained a history, examined the patient, evaluated Pertinent laboratory and Radio[employer]/imaging results, and  formulated the assessment and plan The Patient requires high complexity decision making for assessment and support, frequent evaluation and titration of therapies. Updated Husband  Critical Care time - 30 mins  Stephanie AcreVishal Alto Gandolfo, MD [place] [facility] Pager [phone]- [phone] (please enter 7-digits) On Call Pager - [phone] (please enter 7-digits)

## 2015-09-13 NOTE — Plan of Care (Signed)
Problem: SLP Dysphagia Goals Goal: Misc Dysphagia Goal Pt will safely tolerate po diet of least restrictive consistency w/ no overt s/s of aspiration noted by Staff/pt/family x3 sessions.    

## 2015-09-13 NOTE — Care Management (Signed)
OT was able to provide treatment today.  Patient took 3 teaspoons of nectar thick orange juice and 2 teaspoons of applesauce.    Medical Team meeting today to identify goals as follows 1. Identify lead attending 2.  Neurological assessment- functional in nature to determine if patient can process information.  Determine ability to communicate nonverbally and with gestures.  3. After neuro consult and not before , have psych to revaluate for competency.  Would rather have Dr Toni Amendlapacs. 4. To address nutrition: make plan to stop tube feedings, remove dobb hoff and perform 48 hour nutritional assessment.  Involve nursing and have very specific documentation of the amount of intake.  Ultimately will anticipate proceeding with gtube if nutritional status indicates it is needed.  Involve husband and daughter in feeding attempts 5. Establish mechanism for consistent daily care updates to husband 6.  sit up for dialysis 7.  Investigate ethic issues surrounding care's  issues  CM was informed that patient's husband has requested that all disciplines call him at the time of care delivery. It was discussed during care meeting that would identify lead staff person to update patient on care/respone/plans and each discipline would not be calling after each treatment.  Mr Archie Pattenmonroe call the unit while CM was in the unit asking for each discipline's phone number.  CM spoke with him and discussed that team is working on a daily method of communication and each discipline will not be calling with care updates after each contact.  Provided him with this CM phone number.  Restated that care team members would not be calling him after each treatment.

## 2015-09-13 NOTE — Progress Notes (Signed)
Occupational Therapy Treatment Patient Details Name: Courtney [name] MRN: [id number] DOB: 1966/02/09 Today's Date: 09/13/2015    History of present illness Pt is a 47 F who has been in medical facilities (hosp, LTAC, rehab) for 2 yrs following gastric bypass surgery with multiple complications. Now with chronic trach, ESRD, profound debilitation, severe sacral pressure ulcer (stage IV). Was seemingly making progress and transferred to rehab facility approx one week prior to this admission. She was sent to [facility] ED 9/23 with AMS and hypotension. Admitted with sepsis related to infected sacral ulcer.  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  Patient transferred to CCU to med-surg floor during hospitalization, but experienced profound interdialytic hypotension requiring return transfer to CCU for use of pressors (now off).  New orders received to resume PT; cleared by RN for participation as tolerated.   OT comments  Patient did allow minimal intervension. Despite elevation, patient's hand shows edema. Provided anti edema massage, wrist and finger mobilization and stretching R upper extremity to shoulder flexion, external and internal rotation elbow flexion and extension, forearm supination and pronation, wrist flexion and extension, and hand flexion and extension. Positioned hand in higher elevation.   Follow Up Recommendations  SNF    Equipment Recommendations       Recommendations for Other Services      Precautions / Restrictions Precautions Precaution Comments: stage 4 sacral ulcer, NG tube/NPO, trach, contact isolation, R IJ permcath; dialysis 3 days a week with next one scheduled for Monday due to difficulty tolerating it a few days ago Restrictions Weight Bearing Restrictions: No       Mobility Bed Mobility                   Transfers                      Balance                                   ADL                                                Vision                     Perception     Praxis      Cognition                             Extremity/Trunk Assessment               Exercises     Shoulder Instructions       General Comments      Pertinent Vitals/ Pain          Home Living                                          Prior Functioning/Environment              Frequency       Progress Toward Goals  OT Goals(current goals can now be found in the care plan section)        Plan      Co-evaluation                 End of Session     Activity Tolerance Patient limited by lethargy   Patient Left     Nurse Communication          Time: [id number] OT Time Calculation (min): 14 min  Charges: OT General Charges $OT Visit: 1 Procedure OT Treatments $Therapeutic Activity: 8-22 mins Ocie CornfieldJohn M Emorie Mcfate, MS/OTR/L  Elyn PeersHuff, Tyhir Schwan M 09/13/2015, 11:20 AM

## 2015-09-13 NOTE — Progress Notes (Signed)
Subjective:  Tachycardic this AM. Reports shes in some pain by nodding yes when asked today. Had HD yesterday. Blood pressure 115/34 at the moment.     Objective:  Vital signs in last 24 hours:  Temp:  [98 F (36.7 C)-99.1 F (37.3 C)] 98.9 F (37.2 C) (10/25 0800) Pulse Rate:  [99-112] 107 (10/25 0100) Resp:  [0-25] 16 (10/25 0100) BP: (99-149)/(34-85) 115/34 mmHg (10/25 0100) SpO2:  [94 %-100 %] 94 % (10/25 0818) FiO2 (%):  [28 %] 28 % (10/25 0818) Weight:  [84.9 kg (187 lb 2.7 oz)] 84.9 kg (187 lb 2.7 oz) (10/24 2100)  Weight change: 0 kg (0 lb) Filed Weights   09/12/15 0419 09/12/15 1725 09/12/15 2100  Weight: 84.9 kg (187 lb 2.7 oz) 84.9 kg (187 lb 2.7 oz) 84.9 kg (187 lb 2.7 oz)    Intake/Output: I/O last 3 completed shifts: In: 2098 [I.V.:20; NG/GT:1928; IV Piggyback:150] Out: 500 [Other:500]   Intake/Output this shift:     Physical Exam: General: No acute distress  Head: [place]/AT eyes open NG in place  Eyes: anicteric  Neck: Trach in place  Lungs:  Scattered rhonchi, normal effort  Heart: S1S2 tachycardic  Abdomen:  Soft, nontender, BS present  Extremities:  + dependent edema,   Neurologic:  arousable this AM.     Access: R IJ permcath    Basic Metabolic Panel:  Recent Labs Lab 09/08/15 1945 09/12/15 0450  NA 136 138  K 3.5 3.9  CL 98* 100*  CO2 32 31  GLUCOSE 121* 132*  BUN 55* 72*  CREATININE 1.31* 1.60*  CALCIUM 7.5* 7.8*  PHOS  --  4.5    Liver Function Tests:  Recent Labs Lab 09/12/15 0450  AST 16  ALT 10*  ALKPHOS 968*  BILITOT 1.0  PROT 4.2*  ALBUMIN 1.4*   No results for input(s): LIPASE, AMYLASE in the last 168 hours. No results for input(s): AMMONIA in the last 168 hours.  CBC:  Recent Labs Lab 09/09/15 0535 09/11/15 0522 09/12/15 0450  WBC 14.7* 12.6* 13.4*  HGB 8.1* 7.4* 7.7*  HCT 25.5* 24.3* 24.5*  MCV 96.4 96.2 96.4  PLT 206 179 198    Cardiac Enzymes: No results for input(s): CKTOTAL, CKMB, CKMBINDEX,  TROPONINI in the last 168 hours.  BNP: Invalid input(s): POCBNP  CBG:  Recent Labs Lab 09/12/15 0719 09/12/15 1219 09/12/15 1700 09/12/15 2124 09/13/15 0723  GLUCAP 125* 122* 120* 107* 116*    Microbiology: Results for orders placed or performed during the hospital encounter of Aug 17, 2015  Blood Culture (routine x 2)     Status: None   Collection Time: 08/17/15  8:51 AM  Result Value Ref Range Status   Specimen Description BLOOD [name]  Final   Special Requests BOTTLES DRAWN AEROBIC AND ANAEROBIC  3CC  Final   Culture NO GROWTH 5 DAYS  Final   Report Status 08/17/2015 FINAL  Final  Blood Culture (routine x 2)     Status: None   Collection Time: 08-17-2015  9:20 AM  Result Value Ref Range Status   Specimen Description BLOOD LEFT ARM  Final   Special Requests BOTTLES DRAWN AEROBIC AND ANAEROBIC  1CC  Final   Culture NO GROWTH 5 DAYS  Final   Report Status 08/17/2015 FINAL  Final  Wound culture     Status: None   Collection Time: 17-Aug-2015  9:20 AM  Result Value Ref Range Status   Specimen Description DECUBITIS  Final   Special Requests Normal  Final   Gram Stain   Final    FEW WBC SEEN MANY GRAM NEGATIVE RODS RARE GRAM POSITIVE COCCI    Culture   Final    HEAVY GROWTH ESCHERICHIA COLI MODERATE GROWTH ENTEROBACTER AEROGENES PROTEUS MIRABILIS HEAVY GROWTH ENTEROCOCCUS SPECIES VRE HAVE INTRINSIC RESISTANCE TO MOST COMMONLY USED ANTIBIOTICS AND THE ABILITY TO ACQUIRE RESISTANCE TO MOST AVAILABLE ANTIBIOTICS.    Report Status 08/16/2015 FINAL  Final   Organism ID, Bacteria ESCHERICHIA COLI  Final   Organism ID, Bacteria ENTEROBACTER AEROGENES  Final   Organism ID, Bacteria PROTEUS MIRABILIS  Final   Organism ID, Bacteria ENTEROCOCCUS SPECIES  Final      Susceptibility   Enterobacter aerogenes - MIC*    CEFTAZIDIME <=1 SENSITIVE Sensitive     CEFAZOLIN >=64 RESISTANT Resistant     CEFTRIAXONE <=1 SENSITIVE Sensitive     CIPROFLOXACIN <=0.25 SENSITIVE Sensitive      GENTAMICIN <=1 SENSITIVE Sensitive     IMIPENEM 1 SENSITIVE Sensitive     TRIMETH/SULFA <=20 SENSITIVE Sensitive     * MODERATE GROWTH ENTEROBACTER AEROGENES   Escherichia coli - MIC*    AMPICILLIN <=2 SENSITIVE Sensitive     CEFTAZIDIME <=1 SENSITIVE Sensitive     CEFAZOLIN <=4 SENSITIVE Sensitive     CEFTRIAXONE <=1 SENSITIVE Sensitive     CIPROFLOXACIN <=0.25 SENSITIVE Sensitive     GENTAMICIN <=1 SENSITIVE Sensitive     IMIPENEM <=0.25 SENSITIVE Sensitive     TRIMETH/SULFA <=20 SENSITIVE Sensitive     * HEAVY GROWTH ESCHERICHIA COLI   Proteus mirabilis - MIC*    AMPICILLIN >=32 RESISTANT Resistant     CEFTAZIDIME <=1 SENSITIVE Sensitive     CEFAZOLIN 8 SENSITIVE Sensitive     CEFTRIAXONE <=1 SENSITIVE Sensitive     CIPROFLOXACIN <=0.25 SENSITIVE Sensitive     GENTAMICIN <=1 SENSITIVE Sensitive     IMIPENEM 1 SENSITIVE Sensitive     TRIMETH/SULFA <=20 SENSITIVE Sensitive     * PROTEUS MIRABILIS   Enterococcus species - MIC*    AMPICILLIN >=32 RESISTANT Resistant     VANCOMYCIN >=32 RESISTANT Resistant     GENTAMICIN SYNERGY SENSITIVE Sensitive     TETRACYCLINE Value in next row Resistant      RESISTANT>=16    * HEAVY GROWTH ENTEROCOCCUS SPECIES  MRSA PCR Screening     Status: None   Collection Time: Aug 17, 2015  2:38 PM  Result Value Ref Range Status   MRSA by PCR NEGATIVE NEGATIVE Final    Comment:        The GeneXpert MRSA Assay (FDA approved for NASAL specimens only), is one component of a comprehensive MRSA colonization surveillance program. It is not intended to diagnose MRSA infection nor to guide or monitor treatment for MRSA infections.   Blood culture (single)     Status: None   Collection Time: August 17, 2015  3:36 PM  Result Value Ref Range Status   Specimen Description BLOOD RIGHT ASSIST CONTROL  Final   Special Requests BOTTLES DRAWN AEROBIC AND ANAEROBIC  Final   Culture NO GROWTH 5 DAYS  Final   Report Status 08/17/2015 FINAL  Final  Wound culture      Status: None   Collection Time: 08/13/15 12:37 PM  Result Value Ref Range Status   Specimen Description WOUND  Final   Special Requests Normal  Final   Gram Stain   Final    FEW WBC SEEN TOO NUMEROUS TO COUNT GRAM NEGATIVE RODS FEW GRAM POSITIVE COCCI  Culture   Final    HEAVY GROWTH ESCHERICHIA COLI MODERATE GROWTH PROTEUS MIRABILIS LIGHT GROWTH KLEBSIELLA PNEUMONIAE MODERATE GROWTH ENTEROCOCCUS GALLINARUM CRITICAL RESULT CALLED TO, READ BACK BY AND VERIFIED WITH: [facility] PsANDRA BORBA AT 1042 08/16/15 DV    Report Status 08/17/2015 FINAL  Final   Organism ID, Bacteria ESCHERICHIA COLI  Final   Organism ID, Bacteria PROTEUS MIRABILIS  Final   Organism ID, Bacteria KLEBSIELLA PNEUMONIAE  Final   Organism ID, Bacteria ENTEROCOCCUS GALLINARUM  Final      Susceptibility   Escherichia coli - MIC*    AMPICILLIN >=32 RESISTANT Resistant     CEFTAZIDIME 4 RESISTANT Resistant     CEFAZOLIN >=64 RESISTANT Resistant     CEFTRIAXONE 16 RESISTANT Resistant     GENTAMICIN 2 SENSITIVE Sensitive     IMIPENEM >=16 RESISTANT Resistant     TRIMETH/SULFA <=20 SENSITIVE Sensitive     Extended ESBL POSITIVE Resistant     PIP/TAZO Value in next row Resistant      RESISTANT>=128    CIPROFLOXACIN Value in next row Sensitive      SENSITIVE<=0.25    * HEAVY GROWTH ESCHERICHIA COLI   Klebsiella pneumoniae - MIC*    AMPICILLIN Value in next row Resistant      SENSITIVE<=0.25    CEFTAZIDIME Value in next row Resistant      SENSITIVE<=0.25    CEFAZOLIN Value in next row Resistant      SENSITIVE<=0.25    CEFTRIAXONE Value in next row Resistant      SENSITIVE<=0.25    CIPROFLOXACIN Value in next row Resistant      SENSITIVE<=0.25    GENTAMICIN Value in next row Sensitive      SENSITIVE<=0.25    IMIPENEM Value in next row Resistant      SENSITIVE<=0.25    TRIMETH/SULFA Value in next row Resistant      SENSITIVE<=0.25    PIP/TAZO Value in next row Resistant      RESISTANT>=128    * LIGHT GROWTH  KLEBSIELLA PNEUMONIAE   Proteus mirabilis - MIC*    AMPICILLIN Value in next row Resistant      RESISTANT>=128    CEFTAZIDIME Value in next row Sensitive      RESISTANT>=128    CEFAZOLIN Value in next row Sensitive      RESISTANT>=128    CEFTRIAXONE Value in next row Sensitive      RESISTANT>=128    CIPROFLOXACIN Value in next row Sensitive      RESISTANT>=128    GENTAMICIN Value in next row Sensitive      RESISTANT>=128    IMIPENEM Value in next row Sensitive      RESISTANT>=128    TRIMETH/SULFA Value in next row Sensitive      RESISTANT>=128    PIP/TAZO Value in next row Sensitive      SENSITIVE<=4    * MODERATE GROWTH PROTEUS MIRABILIS   Enterococcus gallinarum - MIC*    AMPICILLIN Value in next row Resistant      SENSITIVE<=4    GENTAMICIN SYNERGY Value in next row Sensitive      SENSITIVE<=4    CIPROFLOXACIN Value in next row Resistant      RESISTANT>=8    TETRACYCLINE Value in next row Resistant      RESISTANT>=16    * MODERATE GROWTH ENTEROCOCCUS GALLINARUM  Wound culture     Status: None   Collection Time: 08/15/15  2:53 PM  Result Value Ref Range Status   Specimen Description WOUND  Final   Special Requests NONE  Final   Gram Stain FEW WBC SEEN NO ORGANISMS SEEN   Final   Culture NO GROWTH 3 DAYS  Final   Report Status 08/18/2015 FINAL  Final  C difficile quick scan w PCR reflex     Status: None   Collection Time: 08/17/15 11:34 AM  Result Value Ref Range Status   C Diff antigen NEGATIVE NEGATIVE Final   C Diff toxin NEGATIVE NEGATIVE Final   C Diff interpretation Negative for C. difficile  Final  Stool culture     Status: None   Collection Time: 09/03/15  3:51 PM  Result Value Ref Range Status   Specimen Description STOOL  Final   Special Requests Immunocompromised  Final   Culture   Final    NO SALMONELLA OR SHIGELLA ISOLATED No Pathogenic E. coli detected NO CAMPYLOBACTER DETECTED    Report Status 09/07/2015 FINAL  Final  C difficile quick scan w  PCR reflex     Status: None   Collection Time: 09/13/15 12:51 AM  Result Value Ref Range Status   C Diff antigen NEGATIVE NEGATIVE Final   C Diff toxin NEGATIVE NEGATIVE Final   C Diff interpretation Negative for C. difficile  Final    Coagulation Studies: No results for input(s): LABPROT, INR in the last 72 hours.  Urinalysis: No results for input(s): COLORURINE, LABSPEC, PHURINE, GLUCOSEU, HGBUR, BILIRUBINUR, KETONESUR, PROTEINUR, UROBILINOGEN, NITRITE, LEUKOCYTESUR in the last 72 hours.  Invalid input(s): APPERANCEUR    Imaging: Dg Chest Port 1 View  09/12/2015  CLINICAL DATA:  Shortness of breath.  Chronic renal failure EXAM: PORTABLE CHEST 1 VIEW COMPARISON:  September 12, 2015 FINDINGS: Tracheostomy catheter tip is 5.2 cm above the carina. There is a right-sided central catheter with the tip just inferior to the cavoatrial junction in the right atrium. A left-sided central catheter has its tip in the superior vena cava. Feeding tube tip is below the diaphragm, likely in the duodenum. No pneumothorax. There is no edema or consolidation. There is a minimal left pleural effusion. Heart is mildly enlarged with pulmonary vascularity within normal limits. No adenopathy. IMPRESSION: Tube and catheter positions are stable without pneumothorax. Stable cardiac prominence. Minimal left effusion. Lungs elsewhere clear. Electronically Signed   By: Bretta Bang III M.D.   On: 09/12/2015 11:05   Dg Chest Port 1 View  09/12/2015  CLINICAL DATA:  Respiratory failure EXAM: PORTABLE CHEST 1 VIEW COMPARISON:  09/07/2015 FINDINGS: The ET tube tip is above the carina. Left IJ catheter tip is in the SVC. There is a right sided dialysis catheter with tips in the right atrium. A feeding tube is noted with tip below the GE junction. Stable cardiac enlargement and small bilateral pleural effusions. IMPRESSION: 1. Stable support apparatus. 2. Cardiac enlargement and small bilateral pleural effusions.  Electronically Signed   By: Signa Kell M.D.   On: 09/12/2015 09:30     Medications:   . feeding supplement (JEVITY 1.2 CAL) 1,000 mL (09/12/15 2020)  . norepinephrine Stopped (09/10/15 0932)   . acidophilus  1 capsule Oral Daily  . amantadine  200 mg Oral Q7 days  . antiseptic oral rinse  7 mL Mouth Rinse q12n4p  . chlorhexidine  15 mL Mouth Rinse BID  . epoetin (EPOGEN/PROCRIT) injection  10,000 Units Intravenous Q M,W,F-HD  . feeding supplement (PRO-STAT SUGAR FREE 64)  30 mL Per Tube 6 times per day  . FLUoxetine  10 mg Oral Daily  .  free water  200 mL Per Tube 3 times per day  . heparin subcutaneous  5,000 Units Subcutaneous Q12H  . hydrocortisone  10 mg Per Tube BID  . lidocaine  1 patch Transdermal Q24H  . megestrol  400 mg Per Tube Daily  . midodrine  10 mg Oral TID WC  . multivitamin  5 mL Per Tube Daily  . nystatin   Topical TID  . pantoprazole sodium  40 mg Per Tube Q1200  . sodium chloride  10-40 mL Intracatheter Q12H  . sodium hypochlorite   Irrigation BID   sodium chloride, sodium chloride, sodium chloride, sodium chloride, acetaminophen (TYLENOL) oral liquid 160 mg/5 mL, ipratropium-albuterol, loperamide, morphine injection, oxyCODONE-acetaminophen, sodium chloride  Assessment/ Plan:  49 y.o. 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, [facility] and now to [facility]  1. End-stage renal disease on hemodialysis on HD MWF. The  patient has been on dialysis since October of 2014. R IJ permcath.  -  Had hemodialysis yesterday, no acute indication today, will prepare orders for tomorrow.   2. Anemia of CKD: pt continues to require periodic blood transfusions, epo hasn't been as effective as hoped likely due to chronic inflammation from decubitus ulcer/infection etc. -last hgb 7.7, would transfuse for hgb of 7 or less.  3. Secondary hyperparathyroidism: PTH low at 55 - last phos 4.5 and acceptable.   4. Hypotension: required vasopressors earlier in admission. - prominent during HD, continue pressors prn, midodrine, albumin with HD, low BFRs during HD.  5.  Dispo:  Unclear at this time. Has to be able to sit in chair for the duration of treatment prior to outpatient discharge Otherwise may need to look into stretcher dialysis facilities Overall prognosis is extremely poor   LOS: 32 Payge Eppes 10/25/201611:34 AM

## 2015-09-13 NOTE — Progress Notes (Signed)
Speech Language Pathology Treatment: Dysphagia;Passy Muir Speaking valve  Patient Details Name: Courtney [name] MRN: [id number] DOB: 02/06/1966 Today's Date: 09/13/2015 Time: 1120-1150 SLP Time Calculation (min) (ACUTE ONLY): 30 min  Assessment / Plan / Recommendation Clinical Impression  PMV placed for PO trials; vitals remained stable throughout PO trial activity (HR: 113-115; RR 15-21; O2: 97-99). Pt's eyes were open for entire ST visit and eye contact generally consistent with ST. After placing PMV, ST attempted to elicit phonation with maximal cuing (without success).  Pt took 3 tsp of nectar thick orange juice and demonstrated adequate oral management and lingual movements as well as good oral clearing. Pt took 2 tsp of apple sauce. Across PO trials, PT did not cough and there was no change in RR. NSG updated.    HPI Other Pertinent Information: Pt is a [age] F who has been in medical facilities (hosp, LTAC, rehab) for 2 yrs following gastric bypass surgery with multiple complications. Now with chronic trach, ESRD, profound debilitation, severe sacral pressure ulcer. Was seemingly making progress and transferred to rehab facility approx one week prior to this admission. She [facility] ED with AMS and hypotension. Working dx of severe sepsis/septic shock due to infected sacral pressure ulcer. Also has R side externalized HD cath and tunneled L IJ CVL. She has history of resistant bacterial infections. She is on chronic steroids. Her husband indicates that she has been diagnosed with adrenal insuff at some point during the past 2 yrs. 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 Upon discussion w/ husband, pt was eating an oral diet at [facility] WITH the trach Capped; tolerated this adequately w/ no deficits. Pt has been responsive to verbal stim and following a few basic commands such as stick out tongue w/ Neurologist. 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 Pt has passed the PMV eval w/ f/u tx session now but requires 100% supervision when wearing the valve. She has only phonated x2 during PMV use w/ SLP; no decline in O2 sats or discomfort noted. Pt currently has an NG tube in place for TFs for nutrition. [name] wants the NG tube to remain in place w/ TFs at this time while po diet begins. Dietician monitoring amounts.   Pertinent Vitals Pain Assessment: No/denies pain  SLP Plan  Continue with current plan of care    Recommendations Diet recommendations: NPO (per MD (on 10/24)) Liquids provided via: Teaspoon Medication Administration: Crushed with puree Supervision: Staff to assist with self feeding;Full supervision/cueing for compensatory strategies;Trained caregiver to feed patient Compensations: Minimize environmental distractions;Slow rate;Small sips/bites Postural Changes and/or Swallow Maneuvers: Seated upright 90 degrees      Patient may use Passy-Muir Speech Valve: with SLP only;Caregiver trained to provide supervision PMSV Supervision: Full       Oral Care Recommendations: Oral care QID;Staff/trained caregiver to provide oral care Follow up Recommendations: Skilled Nursing facility Plan: Continue with current plan of care    GO     Courtney [name] 09/13/2015, 11:59 AM

## 2015-09-14 LAB — BASIC METABOLIC PANEL
ANION GAP: 7 (ref 5–15)
BUN: 65 mg/dL — ABNORMAL HIGH (ref 6–20)
CALCIUM: 7.6 mg/dL — AB (ref 8.9–10.3)
CO2: 32 mmol/L (ref 22–32)
Chloride: 100 mmol/L — ABNORMAL LOW (ref 101–111)
Creatinine, Ser: 1.41 mg/dL — ABNORMAL HIGH (ref 0.44–1.00)
GFR, EST AFRICAN AMERICAN: 49 mL/min — AB (ref >60)
GFR, EST NON AFRICAN AMERICAN: 43 mL/min — AB (ref >60)
GLUCOSE: 154 mg/dL — AB (ref 65–99)
Potassium: 3.9 mmol/L (ref 3.5–5.1)
SODIUM: 139 mmol/L (ref 135–145)

## 2015-09-14 LAB — GLUCOSE, CAPILLARY
GLUCOSE-CAPILLARY: 131 mg/dL — AB (ref 65–99)
Glucose-Capillary: 132 mg/dL — ABNORMAL HIGH (ref 65–99)
Glucose-Capillary: 85 mg/dL (ref 65–99)
Glucose-Capillary: 119 mg/dL — ABNORMAL HIGH (ref 65–99)

## 2015-09-14 LAB — CBC
HCT: 25.2 % — ABNORMAL LOW (ref 35.0–47.0)
Hemoglobin: 7.8 g/dL — ABNORMAL LOW (ref 12.0–16.0)
MCH: 30.3 pg (ref 26.0–34.0)
MCHC: 30.8 g/dL — ABNORMAL LOW (ref 32.0–36.0)
MCV: 98.3 fL (ref 80.0–100.0)
PLATELETS: 192 10*3/uL (ref 150–440)
RBC: 2.57 MIL/uL — ABNORMAL LOW (ref 3.80–5.20)
RDW: 22.9 % — AB (ref 11.5–14.5)
WBC: 14.2 10*3/uL — AB (ref 3.6–11.0)

## 2015-09-14 LAB — PHOSPHORUS: PHOSPHORUS: 3.1 mg/dL (ref 2.5–4.6)

## 2015-09-14 MED ORDER — JEVITY 1.2 CAL PO LIQD
1000.0000 mL | ORAL | Status: DC
  Administered 2015-09-14: 1000 mL
  Filled 2015-09-14: qty 1000

## 2015-09-14 MED ORDER — LOPERAMIDE HCL 2 MG PO CAPS
2.0000 mg | ORAL_CAPSULE | Freq: Every day | ORAL | Status: DC | PRN
  Administered 2015-09-14: 2 mg via ORAL
  Filled 2015-09-14: qty 1

## 2015-09-14 MED ORDER — ALBUMIN HUMAN 25 % IV SOLN
25.0000 g | INTRAVENOUS | Status: DC
  Administered 2015-09-14 – 2015-09-16 (×2): 25 g via INTRAVENOUS
  Filled 2015-09-14 (×5): qty 100

## 2015-09-14 NOTE — Progress Notes (Signed)
HRR TACHY/PT RESTING IN BED/NO S/S OF DISTRESS NOTED/GENERALIZED EDEMA NOTED/CLEAR/DIMINISHED BREATH SOUNDS NOTED/ALERT ABLE TO FOLLOW SOME COMMANDS/CATHETER PATENT/DRESSING INTACT/BP STABLE AT THIS TIME

## 2015-09-14 NOTE — Clinical Social Work Note (Signed)
CSW continues to follow for potential/eventual discharge back to [facility] when time. Patient's route of feeding and dialysis will also need to be finalized prior to discharge. York SpanielMonica Cristin Szatkowski MSW,LCSW [phone]

## 2015-09-14 NOTE — Progress Notes (Signed)
PT Cancellation Note  Patient Details Name: Lisette GrinderHedda McMillian Monroe MRN: [id number] DOB: [birth year]   Cancelled Treatment:    Reason Eval/Treat Not Completed: Patient at procedure or test/unavailable (Treatment session attempted.  Patient currently in dialysis and unavailable for treatment session.  Will re-attempt at later time/date as medically appropriate and available.)   Jariyah Hackley H. Manson PasseyBrown, PT, DPT, NCS 09/14/2015, 11:47 AM [phone]

## 2015-09-14 NOTE — Progress Notes (Signed)
Speech Therapy Note: met w/ pt this morning after Dialysis was started. Pt was initially awake prior, but her eyes were closed at the time of this tx session. Pt was given Max. Verbal and Tactile stim to awaken (repeatedly calling her name, strongly rubbing her Left shoulder, repositioning the bed). However, pt did not open her eyes or give a response to the stimulation. Will attempt to f/u this PM or in the morning. Suspect pt could be fairly fatigued during/post Dialysis and would be at increased risk for aspiration w/ po's - strict aspiration precautions would be nec. CM/NSG updated on this attempt at tx today.

## 2015-09-14 NOTE — Progress Notes (Signed)
[facility] Progess Note  Name: Courtney [name] MRN: [id number] DOB: [birth year]    ADMISSION DATE:  04/14/15   INTERVAL HISTORY: 10/26- had 3 loose stools overnight, still off vasopressors. Blood pressure stable overnight, no fever, white cell count mildly elevated at 14.2, small ulcer on lateral aspect of right foot and right ankle. Increase tube feeds to 50 mL. HD today  10/25- off vasopressors, prozac started yesterday, BP stable overnight, abx stopped yesterday, seen by PT and speech tx yesterday, mild inc in wbc, recheck labs in the AM. Two loose stools yesterday, cdiff neg.  10/24-off vasopressors, started prozac today, HD this PM and assess BP and vasopressors needs Follow CBC, repeat CXR pending, plan for Speech for small oral feeds, increased TF rate to 40 (goal is 60), abx stopped for now,    10/23- off vasopressors, 1 soft BM overnight, low H/H this AM, husband at bedside.   10/22 - off vasopressors, tolerated slow HD yesterday, tracking with eyes, and will nod head to answer questions. Sacral decub debrided at bedside yesterday. Restarted on levophed 5mcg during dialysis, stopped this AM.   10/21-off vasopressors s/p wound debridement at bedside by gen surgery Morphine given for severe pain, plan for slow HD today and assess BP Continue step down monitoring, plan for TEE next week will discuss with Husband  10/20-transferred to step down for sepsis/aspiration pneumonia, weaned off levophed this AM -abx adjusted, continue TF's, will NOT start oral feeds Plan for TEE at some point, appreciate surgical input, obtain CBC,chem 7, CXR as needed Will update husband today  10/19-patient with minimal amounts of food this AM, will plan to continue oral intake along with Tube feedings and assess calorie count This was discussed with Husband, plan to stop TF's tomorrow if she tolerates oral feeds and remove NG tube in next 2 days Will talk and discuss  with Husband that patient is very high risk for aspiration-will obtain Surgery consult to reassess decub ulcer Will consult cardiology to assess for TEE  10/18 discussed with husband-patient passed swallow eval spoke with Speech Therapist Will add Pureed thick nectar foods and continue Enteral feeds through NG tube.  Will add Megace   On 10/16  the patient's husband was upset due to placement of a rectal tube by on-call physician, this was removed. It was also noted that the pt developed diarrhea, therefore Jevity was changed from 1.5 with fiber to Jevity 1.2; the rate was decreased to 20 cc/hr and the pt was given a dose of lomotil, which has helped the diarrhea. It was also sent for stool culture and Cdif.   On 10/17 discussed with husband options going forward. He would like to [facility] as it [facility] closer to him, also  [facility] is there so he can have his wife evaluated better and that he can have the [employer] come and check on her status personally-Dr. Belia HemanKasa has discussed case with Hospitalist and they pretty much have refused to accept her at this time.   -He declines transfer of [place] [facility] back [facility].   -He would like to repeat speech evaluation and Pasey-Muir valve as he hopes that his wife will be able to eat and then will not need a feeding tube. He understands that she may be "stuck" in the hospital as currently no local LTAC is accepting her. He is continuing discussions with [facility].   -He would like his wife restarted on aspirin due to stroke risk. It  was explained that given her life threatening bleed the rounding physician did not feel comfortable restarting this medication at this time, but he could revisit this discussion with other physicians in the future.    INITIAL PRESENTATION:  79 F who has been in medical facilities (hosp, LTAC, rehab) for 2 yrs following gastric bypass surgery with multiple complications. Now with chronic trach, ESRD, profound debilitation, severe  sacral pressure ulcer. Was seemingly making progress and transferred to rehab facility approx one week prior to this admission. She was sent to [facility] ED with AMS and hypotension. Working dx of severe sepsis/septic shock due to infected sacral pressure ulcer. Also has R side externalized HD cath and tunneled L IJ CVL. She has history of resistant bacterial infections. She is on chronic steroids. Her husband indicates that she has been diagnosed with adrenal insuff at some point during the past 2 yrs  MAJOR EVENTS/TEST RESULTS: Admission 02/07/14-05/07/14 Admission 07/21/14-09/06/14 Discharged to [facility]. Pt had palliative consult at that time, were asked to sign off by husband.  09/23 CT head: NAD 09/23 EEG: no epileptiform activity 09/23 PRBCs for Hgb 6.4 09/24 bedside debridement of sacral wound. Abscess drained 09/25 Off vasopressors. More alert. No distress. Worsening thrombocytopenia. Vanc DC'd 09/29  Dr. Sampson Goon (I.D) excused from the case by patient's husband. 10/02 MRI -multiple infarcts 10/03 tracheal bleeding- transfused platelets 10/03 hospitalist service excused from the case by patient's husband 10/03 Echocardiogram ejection fraction was 55-60%, pulmonary systolic pressure was 39 mmHg 16/10 restart TF's at lower rate, attempt reg HD  10/12 Transferred to med-surg floor. Remains on PCCM service 10/14 SLP eval: pt unable to tolerate PMV adequately 10/17-will re-attempt PM valve-discussed with Speech therapist 10/18 passed swallow eval-start pureed thick foods no thin liquids-continue NG feeds 10/19 transferred to step down for sepsis/aspiration pneumonia 10/19 cxr shows RLL opacity 10/21 sacral decub debride at bedside by surgery 10/22 started back on vasopressors while on HD  INDWELLING DEVICES:: Trach (chronic) placed June 2014 Tunneled R IJ HD cath (chronic) Tunneled L IJ CVL (chronic) L femoral A-line 9/23 >> 9/25  MICRO DATA: History of carbopenem resistant enterococcus  and recurrent c. diff from previous hospitalizations. 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 Pneumoniae, P. Mirabilis, VRE Wound 9/26 >> No growth Blood 9/23 >> neg CDiff 9/27>>neg Stool Cx 10/15>> negative Cdiff 10/25>>neg  ANTIMICROBIALS:  Aztreonam 9/23 >> 9/24 Vanc 9/23 >> 9/25 Vanc 9/26>>9/27 Daptomycin 9/27>> 10/12 Meropenem 9/23 >> 10/14   SUBJECTIVE:  RASS 0. Nonverbal at baseline, squeeze left hand, was able to track and follow with eyes. Responded to questions by nodding head.  Speech and physical saw patient yesterday.   VITAL SIGNS: Temp:  [98 F (36.7 C)-99.5 F (37.5 C)] 98 F (36.7 C) (10/26 0945) Pulse Rate:  [102-124] 113 (10/26 1130) Resp:  [0-28] 17 (10/26 1130) BP: (99-148)/(31-99) 116/36 mmHg (10/26 1130) SpO2:  [91 %-100 %] 94 % (10/26 1130) FiO2 (%):  [28 %] 28 % (10/25 2000) HEMODYNAMICS:   VENTILATOR SETTINGS: Vent Mode:  [-]  FiO2 (%):  [28 %] 28 % INTAKE / OUTPUT:  Intake/Output Summary (Last 24 hours) at 09/14/15 1143 Last data filed at 09/14/15 0400  Gross per 24 hour  Intake   1615 ml  Output      0 ml  Net   1615 ml    PHYSICAL EXAMINATION: Physical Examination:   VS: BP 116/36 mmHg  Pulse 113  Temp(Src) 98 F (36.7 C) (Axillary)  Resp 17  Ht  (1.753 m)  Wt 187 lb 2.7 oz (84.9 kg)  BMI 27.63 kg/m2  SpO2 94%  LMP  (LMP Unknown)  General Appearance: No resp distress, coarse upper airway sounds  Neuro: profoundly diffusely weak HEENT: cushingoid facies, PERRLA, EOM intact Neck: trach site clean Pulmonary: clear anteriorly Cardiovascular: reg, no M  Abdomen: soft, NT, +BS Extremities: warm, R>L UE edema    LABORATORY PANEL:   CBC  Recent Labs Lab 09/14/15 0530  WBC 14.2*  HGB 7.8*  HCT 25.2*  PLT 192    Chemistries   Recent Labs Lab 09/12/15 0450 09/14/15 0530  NA 138 139  K 3.9 3.9  CL 100*  100*  CO2 31 32  GLUCOSE 132* 154*  BUN 72* 65*  CREATININE 1.60* 1.41*  CALCIUM 7.8* 7.6*  PHOS 4.5  --   AST 16  --   ALT 10*  --   ALKPHOS 968*  --   BILITOT 1.0  --      CXR: nnf   IMPRESSION/PLAN: PULMONARY A: 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 Diff Stool incontinence Dysphagia P:  Cont PPI Avoid anticoagulants Increase tube feeds to 50 mL, goal is 60 Immodium PRN QD  HEMATOLOGIC A:  Acute on chronic anemia Thrombocytopenia - HIT panel negative 9/25 P:  DVT px: SCDs, resume SQ heparin 10/14 Monitor CBC intermittently Transfuse per usual guidelines, Hb=7.4, recheck in the AM.  plt count 198 10/17 >>  206 on 10/21 >>   198 on 10/24  INFECTIOUS A:  Severe sepsis, resolved-WBC count slowly increasing, 14.2 today Sacral decubitus ulcer with abscess P:  Monitor temp, WBC count-if WBC count continues to increase, will need to consider restarting antibiotics Micro and abx as above-off abx now -follow up gen surgery recs  ENDOCRINE A:  DM 2, controlled. 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 10/02 Profound deconditioning Chronic pain P: RASS goal: 0 Minimize sedating meds Changed analgesics to PRN oxycodone 10/13 Cont PT  -plan for TEE at some point  SOCIAL/DISPO: [name] has been somewhat difficult and has fired ID and Hospitalist services We have sought transfer to [facility] but she has been turned down by [facility] and [facility] We are seeking return to [facility] but she cannot go there with NGT and husband has been reluctant to consent to G-tube placement Care management very involved 10/26 spoke with husband over the phone, updated him on patient's clinical status.  I have personally obtained a history, examined the patient, evaluated Pertinent laboratory and RadioGraphic/imaging results, and  formulated the  assessment and plan The Patient requires high complexity decision making for assessment and support, frequent evaluation and titration of therapies. Updated Husband  Critical Care time - 30 mins  Stephanie Acre, MD [facility] Pager [phone] (please enter 7-digits) On Call Pager - [phone] (please enter 7-digits)

## 2015-09-14 NOTE — Progress Notes (Signed)
Subjective:  Pt resting comfortably in the bed.  Due for HD again today. Orders have been prepared.    Objective:  Vital signs in last 24 hours:  Temp:  [98.4 F (36.9 C)-99.5 F (37.5 C)] 98.5 F (36.9 C) (10/26 0100) Pulse Rate:  [104-124] 105 (10/26 0400) Resp:  [0-28] 21 (10/26 0400) BP: (105-148)/(36-82) 148/82 mmHg (10/26 0400) SpO2:  [91 %-100 %] 100 % (10/26 0400) FiO2 (%):  [28 %] 28 % (10/25 2000)  Weight change:  Filed Weights   09/12/15 0419 09/12/15 1725 09/12/15 2100  Weight: 84.9 kg (187 lb 2.7 oz) 84.9 kg (187 lb 2.7 oz) 84.9 kg (187 lb 2.7 oz)    Intake/Output: I/O last 3 completed shifts: In: 2684.5 [I.V.:20; NG/GT:2664.5] Out: 500 [Other:500]   Intake/Output this shift:     Physical Exam: General: No acute distress  Head: Brinson/AT eyes open NG in place  Eyes: anicteric  Neck: Trach in place  Lungs:  Scattered rhonchi, normal effort  Heart: S1S2 slight tachycardia  Abdomen:  Soft, nontender, BS present  Extremities:  + dependent edema,   Neurologic:  awake this AM.     Access: R IJ permcath    Basic Metabolic Panel:  Recent Labs Lab 09/08/15 1945 09/12/15 0450 09/14/15 0530  NA 136 138 139  K 3.5 3.9 3.9  CL 98* 100* 100*  CO2 32 31 32  GLUCOSE 121* 132* 154*  BUN 55* 72* 65*  CREATININE 1.31* 1.60* 1.41*  CALCIUM 7.5* 7.8* 7.6*  PHOS  --  4.5  --     Liver Function Tests:  Recent Labs Lab 09/12/15 0450  AST 16  ALT 10*  ALKPHOS 968*  BILITOT 1.0  PROT 4.2*  ALBUMIN 1.4*   No results for input(s): LIPASE, AMYLASE in the last 168 hours. No results for input(s): AMMONIA in the last 168 hours.  CBC:  Recent Labs Lab 09/09/15 0535 09/11/15 0522 09/12/15 0450 09/14/15 0530  WBC 14.7* 12.6* 13.4* 14.2*  HGB 8.1* 7.4* 7.7* 7.8*  HCT 25.5* 24.3* 24.5* 25.2*  MCV 96.4 96.2 96.4 98.3  PLT 206 179 198 192    Cardiac Enzymes: No results for input(s): CKTOTAL, CKMB, CKMBINDEX, TROPONINI in the last 168  hours.  BNP: Invalid input(s): POCBNP  CBG:  Recent Labs Lab 09/12/15 1700 09/12/15 2124 09/13/15 0723 09/13/15 1618 09/14/15 0016  GLUCAP 120* 107* 116* 120* 131*    Microbiology: Results for orders placed or performed during the hospital encounter of 08/18/2015  Blood Culture (routine x 2)     Status: None   Collection Time: 2015-08-18  8:51 AM  Result Value Ref Range Status   Specimen Description BLOOD [name]  Final   Special Requests BOTTLES DRAWN AEROBIC AND ANAEROBIC  3CC  Final   Culture NO GROWTH 5 DAYS  Final   Report Status 08/17/2015 FINAL  Final  Blood Culture (routine x 2)     Status: None   Collection Time: Aug 18, 2015  9:20 AM  Result Value Ref Range Status   Specimen Description BLOOD LEFT ARM  Final   Special Requests BOTTLES DRAWN AEROBIC AND ANAEROBIC  1CC  Final   Culture NO GROWTH 5 DAYS  Final   Report Status 08/17/2015 FINAL  Final  Wound culture     Status: None   Collection Time: 2015/08/18  9:20 AM  Result Value Ref Range Status   Specimen Description DECUBITIS  Final   Special Requests Normal  Final   Gram Stain  Final    FEW WBC SEEN MANY GRAM NEGATIVE RODS RARE GRAM POSITIVE COCCI    Culture   Final    HEAVY GROWTH ESCHERICHIA COLI MODERATE GROWTH ENTEROBACTER AEROGENES PROTEUS MIRABILIS HEAVY GROWTH ENTEROCOCCUS SPECIES VRE HAVE INTRINSIC RESISTANCE TO MOST COMMONLY USED ANTIBIOTICS AND THE ABILITY TO ACQUIRE RESISTANCE TO MOST AVAILABLE ANTIBIOTICS.    Report Status 08/16/2015 FINAL  Final   Organism ID, Bacteria ESCHERICHIA COLI  Final   Organism ID, Bacteria ENTEROBACTER AEROGENES  Final   Organism ID, Bacteria PROTEUS MIRABILIS  Final   Organism ID, Bacteria ENTEROCOCCUS SPECIES  Final      Susceptibility   Enterobacter aerogenes - MIC*    CEFTAZIDIME <=1 SENSITIVE Sensitive     CEFAZOLIN >=64 RESISTANT Resistant     CEFTRIAXONE <=1 SENSITIVE Sensitive     CIPROFLOXACIN <=0.25 SENSITIVE Sensitive     GENTAMICIN <=1 SENSITIVE  Sensitive     IMIPENEM 1 SENSITIVE Sensitive     TRIMETH/SULFA <=20 SENSITIVE Sensitive     * MODERATE GROWTH ENTEROBACTER AEROGENES   Escherichia coli - MIC*    AMPICILLIN <=2 SENSITIVE Sensitive     CEFTAZIDIME <=1 SENSITIVE Sensitive     CEFAZOLIN <=4 SENSITIVE Sensitive     CEFTRIAXONE <=1 SENSITIVE Sensitive     CIPROFLOXACIN <=0.25 SENSITIVE Sensitive     GENTAMICIN <=1 SENSITIVE Sensitive     IMIPENEM <=0.25 SENSITIVE Sensitive     TRIMETH/SULFA <=20 SENSITIVE Sensitive     * HEAVY GROWTH ESCHERICHIA COLI   Proteus mirabilis - MIC*    AMPICILLIN >=32 RESISTANT Resistant     CEFTAZIDIME <=1 SENSITIVE Sensitive     CEFAZOLIN 8 SENSITIVE Sensitive     CEFTRIAXONE <=1 SENSITIVE Sensitive     CIPROFLOXACIN <=0.25 SENSITIVE Sensitive     GENTAMICIN <=1 SENSITIVE Sensitive     IMIPENEM 1 SENSITIVE Sensitive     TRIMETH/SULFA <=20 SENSITIVE Sensitive     * PROTEUS MIRABILIS   Enterococcus species - MIC*    AMPICILLIN >=32 RESISTANT Resistant     VANCOMYCIN >=32 RESISTANT Resistant     GENTAMICIN SYNERGY SENSITIVE Sensitive     TETRACYCLINE Value in next row Resistant      RESISTANT>=16    * HEAVY GROWTH ENTEROCOCCUS SPECIES  MRSA PCR Screening     Status: None   Collection Time: 24-Aug-2015  2:38 PM  Result Value Ref Range Status   MRSA by PCR NEGATIVE NEGATIVE Final    Comment:        The GeneXpert MRSA Assay (FDA approved for NASAL specimens only), is one component of a comprehensive MRSA colonization surveillance program. It is not intended to diagnose MRSA infection nor to guide or monitor treatment for MRSA infections.   Blood culture (single)     Status: None   Collection Time: 2015-08-24  3:36 PM  Result Value Ref Range Status   Specimen Description BLOOD RIGHT ASSIST CONTROL  Final   Special Requests BOTTLES DRAWN AEROBIC AND ANAEROBIC  Final   Culture NO GROWTH 5 DAYS  Final   Report Status 08/17/2015 FINAL  Final  Wound culture     Status: None    Collection Time: 08/13/15 12:37 PM  Result Value Ref Range Status   Specimen Description WOUND  Final   Special Requests Normal  Final   Gram Stain   Final    FEW WBC SEEN TOO NUMEROUS TO COUNT GRAM NEGATIVE RODS FEW GRAM POSITIVE COCCI    Culture   Final  HEAVY GROWTH ESCHERICHIA COLI MODERATE GROWTH PROTEUS MIRABILIS LIGHT GROWTH KLEBSIELLA PNEUMONIAE MODERATE GROWTH ENTEROCOCCUS GALLINARUM CRITICAL RESULT CALLED TO, READ BACK BY AND VERIFIED WITH: [facility] BORBA AT 1042 08/16/15 DV    Report Status 08/17/2015 FINAL  Final   Organism ID, Bacteria ESCHERICHIA COLI  Final   Organism ID, Bacteria PROTEUS MIRABILIS  Final   Organism ID, Bacteria KLEBSIELLA PNEUMONIAE  Final   Organism ID, Bacteria ENTEROCOCCUS GALLINARUM  Final      Susceptibility   Escherichia coli - MIC*    AMPICILLIN >=32 RESISTANT Resistant     CEFTAZIDIME 4 RESISTANT Resistant     CEFAZOLIN >=64 RESISTANT Resistant     CEFTRIAXONE 16 RESISTANT Resistant     GENTAMICIN 2 SENSITIVE Sensitive     IMIPENEM >=16 RESISTANT Resistant     TRIMETH/SULFA <=20 SENSITIVE Sensitive     Extended ESBL POSITIVE Resistant     PIP/TAZO Value in next row Resistant      RESISTANT>=128    CIPROFLOXACIN Value in next row Sensitive      SENSITIVE<=0.25    * HEAVY GROWTH ESCHERICHIA COLI   Klebsiella pneumoniae - MIC*    AMPICILLIN Value in next row Resistant      SENSITIVE<=0.25    CEFTAZIDIME Value in next row Resistant      SENSITIVE<=0.25    CEFAZOLIN Value in next row Resistant      SENSITIVE<=0.25    CEFTRIAXONE Value in next row Resistant      SENSITIVE<=0.25    CIPROFLOXACIN Value in next row Resistant      SENSITIVE<=0.25    GENTAMICIN Value in next row Sensitive      SENSITIVE<=0.25    IMIPENEM Value in next row Resistant      SENSITIVE<=0.25    TRIMETH/SULFA Value in next row Resistant      SENSITIVE<=0.25    PIP/TAZO Value in next row Resistant      RESISTANT>=128    * LIGHT GROWTH KLEBSIELLA PNEUMONIAE    Proteus mirabilis - MIC*    AMPICILLIN Value in next row Resistant      RESISTANT>=128    CEFTAZIDIME Value in next row Sensitive      RESISTANT>=128    CEFAZOLIN Value in next row Sensitive      RESISTANT>=128    CEFTRIAXONE Value in next row Sensitive      RESISTANT>=128    CIPROFLOXACIN Value in next row Sensitive      RESISTANT>=128    GENTAMICIN Value in next row Sensitive      RESISTANT>=128    IMIPENEM Value in next row Sensitive      RESISTANT>=128    TRIMETH/SULFA Value in next row Sensitive      RESISTANT>=128    PIP/TAZO Value in next row Sensitive      SENSITIVE<=4    * MODERATE GROWTH PROTEUS MIRABILIS   Enterococcus gallinarum - MIC*    AMPICILLIN Value in next row Resistant      SENSITIVE<=4    GENTAMICIN SYNERGY Value in next row Sensitive      SENSITIVE<=4    CIPROFLOXACIN Value in next row Resistant      RESISTANT>=8    TETRACYCLINE Value in next row Resistant      RESISTANT>=16    * MODERATE GROWTH ENTEROCOCCUS GALLINARUM  Wound culture     Status: None   Collection Time: 08/15/15  2:53 PM  Result Value Ref Range Status   Specimen Description WOUND  Final   Special Requests NONE  Final   Gram Stain FEW WBC SEEN NO ORGANISMS SEEN   Final   Culture NO GROWTH 3 DAYS  Final   Report Status 08/18/2015 FINAL  Final  C difficile quick scan w PCR reflex     Status: None   Collection Time: 08/17/15 11:34 AM  Result Value Ref Range Status   C Diff antigen NEGATIVE NEGATIVE Final   C Diff toxin NEGATIVE NEGATIVE Final   C Diff interpretation Negative for C. difficile  Final  Stool culture     Status: None   Collection Time: 09/03/15  3:51 PM  Result Value Ref Range Status   Specimen Description STOOL  Final   Special Requests Immunocompromised  Final   Culture   Final    NO SALMONELLA OR SHIGELLA ISOLATED No Pathogenic E. coli detected NO CAMPYLOBACTER DETECTED    Report Status 09/07/2015 FINAL  Final  C difficile quick scan w PCR reflex     Status:  None   Collection Time: 09/13/15 12:51 AM  Result Value Ref Range Status   C Diff antigen NEGATIVE NEGATIVE Final   C Diff toxin NEGATIVE NEGATIVE Final   C Diff interpretation Negative for C. difficile  Final    Coagulation Studies: No results for input(s): LABPROT, INR in the last 72 hours.  Urinalysis: No results for input(s): COLORURINE, LABSPEC, PHURINE, GLUCOSEU, HGBUR, BILIRUBINUR, KETONESUR, PROTEINUR, UROBILINOGEN, NITRITE, LEUKOCYTESUR in the last 72 hours.  Invalid input(s): APPERANCEUR    Imaging: Dg Chest Port 1 View  09/12/2015  CLINICAL DATA:  Shortness of breath.  Chronic renal failure EXAM: PORTABLE CHEST 1 VIEW COMPARISON:  September 12, 2015 FINDINGS: Tracheostomy catheter tip is 5.2 cm above the carina. There is a right-sided central catheter with the tip just inferior to the cavoatrial junction in the right atrium. A left-sided central catheter has its tip in the superior vena cava. Feeding tube tip is below the diaphragm, likely in the duodenum. No pneumothorax. There is no edema or consolidation. There is a minimal left pleural effusion. Heart is mildly enlarged with pulmonary vascularity within normal limits. No adenopathy. IMPRESSION: Tube and catheter positions are stable without pneumothorax. Stable cardiac prominence. Minimal left effusion. Lungs elsewhere clear. Electronically Signed   By: Bretta Bang III M.D.   On: 09/12/2015 11:05     Medications:   . feeding supplement (JEVITY 1.2 CAL) 1,000 mL (09/13/15 1806)  . norepinephrine Stopped (09/10/15 0932)   . acidophilus  1 capsule Oral Daily  . albumin human  25 g Intravenous QODAY  . amantadine  200 mg Oral Q7 days  . antiseptic oral rinse  7 mL Mouth Rinse q12n4p  . chlorhexidine  15 mL Mouth Rinse BID  . epoetin (EPOGEN/PROCRIT) injection  10,000 Units Intravenous Q M,W,F-HD  . feeding supplement (PRO-STAT SUGAR FREE 64)  30 mL Per Tube 6 times per day  . FLUoxetine  10 mg Oral Daily  . free  water  200 mL Per Tube 3 times per day  . heparin subcutaneous  5,000 Units Subcutaneous Q12H  . hydrocortisone  10 mg Per Tube BID  . lidocaine  1 patch Transdermal Q24H  . megestrol  400 mg Per Tube Daily  . midodrine  10 mg Oral TID WC  . multivitamin  5 mL Per Tube Daily  . nystatin   Topical TID  . pantoprazole sodium  40 mg Per Tube Q1200  . sodium chloride  10-40 mL Intracatheter Q12H  . sodium hypochlorite   Irrigation  BID   sodium chloride, sodium chloride, sodium chloride, sodium chloride, acetaminophen (TYLENOL) oral liquid 160 mg/5 mL, ipratropium-albuterol, loperamide, morphine injection, oxyCODONE-acetaminophen, sodium chloride  Assessment/ Plan:  49 y.o. 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 End-stage renal disease on hemodialysis on HD MWF. The patient has been on dialysis since October of 2014. R IJ permcath.  -  Due for dialysis treament today, will use sodium modeling, albumin, midodrine to help support BP.  UF target 0.5kg.   2. Anemia of CKD: pt continues to require periodic blood transfusions, epo hasn't been as effective as hoped likely due to chronic inflammation from decubitus ulcer/infection etc. -hgb 7.8 today, continue epogen 10000 units IV with HD.   3. Secondary hyperparathyroidism: PTH low at 55 - check phos today with HD.  4. Hypotension: required vasopressors earlier in  admission. - prominent during HD, continue pressors prn, midodrine, albumin with HD, low BFRs during HD, and sodium modeling.  5.  Dispo:  Unclear at this time. Reviewed care management notes, difficult situation, no significant improvement thus far.    LOS: 33 Courtney [name] 10/26/20167:22 AM

## 2015-09-14 NOTE — Progress Notes (Addendum)
Nutrition Follow-up  DOCUMENTATION CODES:   Severe malnutrition in context of acute illness/injury  INTERVENTION:   EN: received verbal order from MD Mungal during ICU rounds to increase TF to rate of 50 ml/hr. Continue to assess Coordination of Care: discussed persistent loose stool during ICU Rounds, Cdiff negative, pt has hx of long-term antibiotic therapy. Pt has order for imodium prn but has not been receiving, MD Mungal gave ok to give dose of imodium, order changed to daily prn.  Pt also has active calorie count order; pt is receiving trials of po but no active diet order in computer at present. Will not initiate calorie count until active diet order in computer and pt receiving meal trays. Continue to assess   NUTRITION DIAGNOSIS:   Inadequate oral intake related to wound healing, acute illness, chronic illness as evidenced by NPO status, estimated needs.  GOAL:   Patient will meet greater than or equal to 90% of their needs  MONITOR:    (Energy Intake, Anthropometrics, Digestive System, Electrolyte/Renal Profile, Glucose Profile)  REASON FOR ASSESSMENT:   Consult Enteral/tube feeding initiation and management  ASSESSMENT:    Pt continues on scheduled dialysis with low BFR, IV albumin, midodrine; SLP working with pt with regards to trials of solids and liquids with PMV (although noted pt shaking head "no," turning head away during feedings, etc despite encouragement); TF infusing via dobhoff, loose stools continue. Pt with ulceration on right foot/ankle noted today by RN in addition to other pressure ulcers. Per Mardene CelesteJoanna RN, pt alert today, responsive but tearful  EN: Jevity 1.2 TF infusing at rate of 40 ml/hr, Prostat 6 times daily     Skin:   (stage IV sacrum, stage III hip, unstageable foot; rectal ulcer from fleixseal, ulcer in nose from dobhoff)  Digestive System: dobhoff in place since 08/15/15, no vomiting, loose stools continue, yellow in color [facility]  [facility]  Electrolyte and Renal Profile:  Recent Labs Lab 09/08/15 1945 09/12/15 0450 09/14/15 0530  BUN 55* 72* 65*  CREATININE 1.31* 1.60* 1.41*  NA 136 138 139  K 3.5 3.9 3.9  PHOS  --  4.5  --    Glucose Profile:  Recent Labs  09/13/15 1618 09/14/15 0016 09/14/15 0722  GLUCAP 120* 131* 132*   Meds: MVI, lactobacillus, megace, imodium prn, megace  Height:   Ht Readings from Last 1 Encounters:  Aug 30, 2015 5\' 9"  (1.753 m)    Weight:   Wt Readings from Last 1 Encounters:  09/12/15 187 lb 2.7 oz (84.9 kg)    BMI:  Body mass index is 27.63 kg/(m^2).  Estimated Nutritional Needs:   Kcal:  2175-2535 kcals (BEE 1509, 1.2 AF, 1.2-1.4 IF) or 2490-2905 kcals (30-35 kcals/kg)   Protein:  125-166 g (1.5-2.0 g/kg) but likely closer to 166-208 g (2.0-2.5 g/kg)   Fluid:  1000 mL plus UOP  EDUCATION NEEDS:   No education needs identified at this time  HIGH Care Level  Romelle Starcherate Kinsey Karch MS, RD, LDN [phone] Pager

## 2015-09-15 ENCOUNTER — Inpatient Hospital Stay: Payer: 59

## 2015-09-15 DIAGNOSIS — F05 Delirium due to known physiological condition: Secondary | ICD-10-CM

## 2015-09-15 DIAGNOSIS — R29898 Other symptoms and signs involving the musculoskeletal system: Secondary | ICD-10-CM

## 2015-09-15 LAB — GLUCOSE, CAPILLARY
GLUCOSE-CAPILLARY: 130 mg/dL — AB (ref 65–99)
Glucose-Capillary: 148 mg/dL — ABNORMAL HIGH (ref 65–99)
Glucose-Capillary: 149 mg/dL — ABNORMAL HIGH (ref 65–99)
Glucose-Capillary: 147 mg/dL — ABNORMAL HIGH (ref 65–99)

## 2015-09-15 MED ORDER — VITAL AF 1.2 CAL PO LIQD
1000.0000 mL | ORAL | Status: DC
  Administered 2015-09-15: 1000 mL

## 2015-09-15 MED ORDER — HYDROMORPHONE HCL 1 MG/ML IJ SOLN
0.5000 mg | Freq: Every day | INTRAMUSCULAR | Status: DC | PRN
  Administered 2015-09-16 – 2015-09-18 (×4): 0.5 mg via INTRAVENOUS
  Filled 2015-09-15 (×6): qty 1

## 2015-09-15 NOTE — Progress Notes (Addendum)
[facility]  Name: Courtney GrinderHedda McMillian [name] MRN: [id number] DOB: December 11, 1965    ADMISSION DATE:  06-13-15   INTERVAL HISTORY: 10/27-showing large bowel movement last night, yellow in nature. Today team and decided to get a CT abdomen and pelvis without contrast to evaluate for diverticulitis/colitis/sigmoiditis and overall anatomy of her bowels. Change tube feeds from Jevity to Vita.  Patient tolerating speaking valve, we'll attempt to feed with the use of a speaking valve while still having them off in place. Tube feedings to stop 10 minutes before attempting by mouth feeds. 0.5mg  Dilaudid PRN with sacral dressing changes only.    10/26- had 3 loose stools overnight, still off vasopressors. Blood pressure stable overnight, no fever, white cell count mildly elevated at 14.2, small ulcer on lateral aspect of right foot and right ankle. Increase tube feeds to 50 mL. HD today  10/25- off vasopressors, prozac started yesterday, BP stable overnight, abx stopped yesterday, seen by PT and speech tx yesterday, mild inc in wbc, recheck labs in the AM. Two loose stools yesterday, cdiff neg.  10/24-off vasopressors, started prozac today, HD this PM and assess BP and vasopressors needs Follow CBC, repeat CXR pending, plan for Speech for small oral feeds, increased TF rate to 40 (goal is 60), abx stopped for now,    10/23- off vasopressors, 1 soft BM overnight, low H/H this AM, husband at bedside.   10/22 - off vasopressors, tolerated slow HD yesterday, tracking with eyes, and will nod head to answer questions. Sacral decub debrided at bedside yesterday. Restarted on levophed 5mcg during dialysis, stopped this AM.   10/21-off vasopressors s/p wound debridement at bedside by gen surgery Morphine given for severe pain, plan for slow HD today and assess BP Continue step down monitoring, plan for TEE next week will discuss with Husband  10/20-transferred to step down for  sepsis/aspiration pneumonia, weaned off levophed this AM -abx adjusted, continue TF's, will NOT start oral feeds Plan for TEE at some point, appreciate surgical input, obtain CBC,chem 7, CXR as needed Will update husband today  10/19-patient with minimal amounts of food this AM, will plan to continue oral intake along with Tube feedings and assess calorie count This was discussed with Husband, plan to stop TF's tomorrow if she tolerates oral feeds and remove NG tube in next 2 days Will talk and discuss with Husband that patient is very high risk for aspiration-will obtain Surgery consult to reassess decub ulcer Will consult cardiology to assess for TEE  10/18 discussed with husband-patient passed swallow eval spoke with Speech Therapist Will add Pureed thick nectar foods and continue Enteral feeds through NG tube.  Will add Megace   On 10/16  the patient's husband was upset due to placement of a rectal tube by on-call physician, this was removed. It was also noted that the pt developed diarrhea, therefore Jevity was changed from 1.5 with fiber to Jevity 1.2; the rate was decreased to 20 cc/hr and the pt was given a dose of lomotil, which has helped the diarrhea. It was also sent for stool culture and Cdif.   On 10/17 discussed with husband options going forward. [facility] would [facility], also  [facility] is there so he can have his wife evaluated better and that he can have the [employer] come and check on her status personally-Dr. Belia HemanKasa has discussed case with Hospitalist and they pretty much have refused to accept her  at this time.   -He declines transfer of his wife back to [place].   -He would like to repeat speech evaluation and Pasey-Muir valve as he hopes that his wife will be able to eat and then will not need a feeding tube. He understands that she may be "stuck" in the hospital as currently no local LTAC is accepting her. He is continuing discussions with  [facility].   -He would like his wife restarted on aspirin due to stroke risk. It was explained that given her life threatening bleed the rounding physician did not feel comfortable restarting this medication at this time, but he could revisit this discussion with other physicians in the future.    INITIAL PRESENTATION:  68 F who has been in medical facilities (hosp, LTAC, rehab) for 2 yrs following gastric bypass surgery with multiple complications. Now with chronic trach, ESRD, profound debilitation, severe sacral pressure ulcer. Was seemingly making progress and transferred to rehab facility approx one week prior to this admission. She was sent to [facility] ED with AMS and hypotension. Working dx of severe sepsis/septic shock due to infected sacral pressure ulcer. Also has R side externalized HD cath and tunneled L IJ CVL. She has history of resistant bacterial infections. She is on chronic steroids. Her husband indicates that she has been diagnosed with adrenal insuff at some point during the past 2 yrs  MAJOR EVENTS/TEST RESULTS: Admission 02/07/14-05/07/14 Admission 07/21/14-09/06/14 Discharged to [facility]. Pt had palliative consult at that time, were asked to sign off by husband.  09/23 CT head: NAD 09/23 EEG: no epileptiform activity 09/23 PRBCs for Hgb 6.4 09/24 bedside debridement of sacral wound. Abscess drained 09/25 Off vasopressors. More alert. No distress. Worsening thrombocytopenia. Vanc DC'd 09/29  Dr. Sampson Goon (I.D) excused from the case by patient's husband. 10/02 MRI -multiple infarcts 10/03 tracheal bleeding- transfused platelets 10/03 hospitalist service excused from the case by patient's husband 10/03 Echocardiogram ejection fraction was 55-60%, pulmonary systolic pressure was 39 mmHg 96/04 restart TF's at lower rate, attempt reg HD  10/12 Transferred to med-surg floor. 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 Pneumoniae, P. Mirabilis, VRE Wound 9/26 >> No growth Blood 9/23 >> neg CDiff 9/27>>neg Stool Cx 10/15>> negative Cdiff 10/25>>neg  ANTIMICROBIALS:  Aztreonam 9/23 >> 9/24 Vanc 9/23 >> 9/25 Vanc 9/26>>9/27 Daptomycin 9/27>> 10/12 Meropenem 9/23 >> 10/14   SUBJECTIVE:  RASS 0. Nonverbal at baseline, squeeze left hand, was able to track and follow with eyes. Responded to questions by nodding head, smiled today  VITAL SIGNS: Temp:  [98 F (36.7 C)-98.7 F (37.1 C)] 98.7 F (37.1 C) (10/27 0500) Pulse Rate:  [101-118] 109 (10/27 0700) Resp:  [6-39] 19 (10/27 0700) BP: (99-149)/(31-107) 135/77 mmHg (10/27 0700) SpO2:  [92 %-100 %] 99 % (10/27 0700) FiO2 (%):  [28 %] 28 % (  10/27 0410) Weight:  [209 lb 7 oz (95 kg)] 209 lb 7 oz (95 kg) (10/27 0500) HEMODYNAMICS:   VENTILATOR SETTINGS: Vent Mode:  [-]  FiO2 (%):  [28 %] 28 % INTAKE / OUTPUT:  Intake/Output Summary (Last 24 hours) at 09/15/15 0909 Last data filed at 09/15/15 0700  Gross per 24 hour  Intake   1740 ml  Output    494 ml  Net   1246 ml    PHYSICAL EXAMINATION: Physical Examination:   VS: BP 135/77 mmHg  Pulse 109  Temp(Src) 98.7 F (37.1 C) (Oral)  Resp 19  Ht 5\' 9"  (1.753 m)  Wt 209 lb 7 oz (95  kg)  BMI 30.91 kg/m2  SpO2 99%  LMP  (LMP Unknown)  General Appearance: No resp distress, coarse upper airway sounds  Neuro: profoundly diffusely weak HEENT: cushingoid facies, PERRLA, EOM intact Neck: trach site clean Pulmonary: clear anteriorly Cardiovascular: reg, no M  Abdomen: soft, NT, +BS Extremities: warm, R>L UE edema    LABORATORY PANEL:   CBC  Recent Labs Lab 09/14/15 0530  WBC 14.2*  HGB 7.8*  HCT 25.2*  PLT 192    Chemistries   Recent Labs Lab 09/12/15 0450 09/14/15 0530 09/14/15 1933  NA 138 139  --   K 3.9 3.9  --   CL 100* 100*  --   CO2 31 32  --   GLUCOSE 132* 154*  --   BUN 72* 65*  --   CREATININE 1.60* 1.41*  --   CALCIUM 7.8* 7.6*  --   PHOS 4.5  --  3.1  AST 16  --   --   ALT 10*  --   --   ALKPHOS 968*  --   --   BILITOT 1.0  --   --      CXR 10/24: nnf   IMPRESSION/PLAN: PULMONARY A: 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 If patient has CIM/CIP will require muscle biopsy and EMG - this can be taken addressed later.   CARDIOVASCULAR A:  Septic shock, resolved - WBC slowly increasing, 14.2 today, will continue to monitor Hemorrhagic shock, resolved Chronic hypotension P:  MAP goal > 55 mmHg - cont with cortef and midodrine  RENAL A:  ESRD  P:  Monitor BMET intermittently Monitor I/Os Correct electrolytes as indicated Intermittent HD per Renal Service  GASTROINTESTINAL A:  LGIB, resolved Hx of C. Diff Stool incontinence - yellowish Dysphagia P:  Cont PPI Avoid anticoagulants Increase tube feeds to 50 mL (10/26), goal is 60 Immodium PRN QD Patient tolerating speaking valve, we'll attempt to feed by mouth with speaking valve in place, hold tube feeds 10  minutes before attempting by mouth feeding. Oral Feeding is to be done under direct observation of medical staff at all times.  HEMATOLOGIC A:  Acute on chronic anemia Thrombocytopenia - HIT panel negative 9/25 P:  DVT px: SCDs, resume SQ heparin 10/14 Monitor CBC intermittently Transfuse per usual guidelines, Hb=7.4, recheck in the AM.  plt count 198 10/17 >>  206 on 10/21 >>   198 on 10/24  INFECTIOUS A:  Severe sepsis, resolved-WBC count slowly increasing, 14.2 today Sacral decubitus ulcer with abscess P:  Monitor temp, WBC count-if WBC count continues to increase, will need  to consider restarting antibiotics -0.5mg  Dilaudid PRN with sacral dressing changes only.  Micro and abx as above-off abx now -follow up gen surgery recs  ENDOCRINE A:  DM 2, controlled. 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 Profound deconditioning Chronic pain P: RASS goal: 0 Minimize sedating meds Changed analgesics to PRN oxycodone 10/13 Cont PT  -plan for TEE at some point  SOCIAL/DISPO: [name] has been somewhat difficult and has fired ID and Hospitalist services We have sought transfer to [facility] but she has been turned down by [facility] and [facility] We are seeking return to [facility] but she cannot go there with NGT and husband has been reluctant to consent to G-tube placement Care management very involved 10/27 spoke with husband over the phone, updated him on patient's clinical status.  I have personally obtained a history, examined the patient, evaluated Pertinent laboratory and RadioGraphic/imaging results, and  formulated the assessment and plan The Patient requires high complexity decision making for assessment and support, frequent evaluation and titration of therapies. Updated  Husband  Critical Care time - 30 mins  Stephanie Acre, MD [facility] Pager [phone] (please enter 7-digits) On Call Pager - [phone] (please enter 7-digits)

## 2015-09-15 NOTE — Consult Note (Signed)
CC: confusion/disorientation   HPI: Courtney [name] is an 49 y.o. female  has been in medical facilities (hosp, LTAC, rehab) for 2 yrs following gastric bypass surgery with multiple complications. Now with chronic trach, ESRD, profound debilitation, severe sacral pressure ulcer. Was seemingly making progress and transferred to rehab facility approx one week prior to this admission. She was sent to [facility] ED with AMS and hypotension. Working dx of severe sepsis/septic shock due to infected sacral pressure ulcer. Also has R side externalized HD cath and tunneled L IJ CVL. She has history of resistant bacterial infections. She is on chronic steroids. Her husband indicates that she has been diagnosed with adrenal insuff at some point during the past 2 yrs  Past Medical History  Diagnosis Date  . Obesity   . Dyslipidemia   . Hypertension   . Coronary artery disease     s/p BMS 2010 LAD  . Dysrhythmia     ventricular tachycardia resolved after LAD stent and beta blocker  . Diabetes mellitus     with retinopathy, neuropathy and microalbuminemia  . ESRD (end stage renal disease) on dialysis     Past Surgical History  Procedure Laterality Date  . Cholecystectomy  1990  . Pci lad  12/2010  . Colonoscopy  08/2011  . Left heart catheterization with coronary angiogram N/A 01/28/2012    Procedure: LEFT HEART CATHETERIZATION WITH CORONARY ANGIOGRAM;  Surgeon: Lesleigh Noe, MD;  Location: [facility] CATH LAB;  Service: Cardiovascular;  Laterality: N/A;  . Tracheostomy    . Gastric bypass      Family History  Problem Relation Age of Onset  . Hypertension Father   . Diabetes Father   . Cancer Mother   . Diabetes Paternal Grandfather   . Hypertension Paternal Grandfather   . Diabetes Paternal Grandmother   . Hypertension Paternal Grandmother   . Cancer Maternal Grandmother     colon ca    Social History:  reports that she has never smoked. She has never used smokeless tobacco. She reports  that she does not drink alcohol or use illicit drugs.  Allergies  Allergen Reactions  . Contrast Media [Iodinated Diagnostic Agents] Anaphylaxis  . Ampicillin Rash    Medications: I have reviewed the patient's current medications.  ROS: Unable to obtain as pt has tracheostomy and seldomly follows command  Physical Examination: Blood pressure 111/99, pulse 122, temperature 99 F (37.2 C), temperature source Axillary, resp. rate 32, height  (1.753 m), weight 209 lb 7 oz (95 kg), SpO2 95 %.  EOM intact, she tracks me through the room  No facial droop  Pupils/corneals reactive b/l  Moves LUE and does squeeze on L No movement other extremities Very inconsistent with following commands. Would not stick her tongue out.    Laboratory Studies:   Basic Metabolic Panel:  Recent Labs Lab 09/08/15 1945 09/12/15 0450 09/14/15 0530 09/14/15 1933  NA 136 138 139  --   K 3.5 3.9 3.9  --   CL 98* 100* 100*  --   CO2 32 31 32  --   GLUCOSE 121* 132* 154*  --   BUN 55* 72* 65*  --   CREATININE 1.31* 1.60* 1.41*  --   CALCIUM 7.5* 7.8* 7.6*  --   PHOS  --  4.5  --  3.1    Liver Function Tests:  Recent Labs Lab 09/12/15 0450  AST 16  ALT 10*  ALKPHOS 968*  BILITOT 1.0  PROT 4.2*  ALBUMIN 1.4*   No results for input(s): LIPASE, AMYLASE in the last 168 hours. No results for input(s): AMMONIA in the last 168 hours.  CBC:  Recent Labs Lab 09/09/15 0535 09/11/15 0522 09/12/15 0450 09/14/15 0530  WBC 14.7* 12.6* 13.4* 14.2*  HGB 8.1* 7.4* 7.7* 7.8*  HCT 25.5* 24.3* 24.5* 25.2*  MCV 96.4 96.2 96.4 98.3  PLT 206 179 198 192    Cardiac Enzymes: No results for input(s): CKTOTAL, CKMB, CKMBINDEX, TROPONINI in the last 168 hours.  BNP: Invalid input(s): POCBNP  CBG:  Recent Labs Lab 09/14/15 1348 09/14/15 1618 09/15/15 0012 09/15/15 0747 09/15/15 1143  GLUCAP 85 119* 130* 148* 149*    Microbiology: Results for orders placed or performed during the  hospital encounter of 08/04/2015  Blood Culture (routine x 2)     Status: None   Collection Time: 08/17/2015  8:51 AM  Result Value Ref Range Status   Specimen Description BLOOD [name]  Final   Special Requests BOTTLES DRAWN AEROBIC AND ANAEROBIC  3CC  Final   Culture NO GROWTH 5 DAYS  Final   Report Status 08/17/2015 FINAL  Final  Blood Culture (routine x 2)     Status: None   Collection Time: 07/22/2015  9:20 AM  Result Value Ref Range Status   Specimen Description BLOOD LEFT ARM  Final   Special Requests BOTTLES DRAWN AEROBIC AND ANAEROBIC  1CC  Final   Culture NO GROWTH 5 DAYS  Final   Report Status 08/17/2015 FINAL  Final  Wound culture     Status: None   Collection Time: 07/22/2015  9:20 AM  Result Value Ref Range Status   Specimen Description DECUBITIS  Final   Special Requests Normal  Final   Gram Stain   Final    FEW WBC SEEN MANY GRAM NEGATIVE RODS RARE GRAM POSITIVE COCCI    Culture   Final    HEAVY GROWTH ESCHERICHIA COLI MODERATE GROWTH ENTEROBACTER AEROGENES PROTEUS MIRABILIS HEAVY GROWTH ENTEROCOCCUS SPECIES VRE HAVE INTRINSIC RESISTANCE TO MOST COMMONLY USED ANTIBIOTICS AND THE ABILITY TO ACQUIRE RESISTANCE TO MOST AVAILABLE ANTIBIOTICS.    Report Status 08/16/2015 FINAL  Final   Organism ID, Bacteria ESCHERICHIA COLI  Final   Organism ID, Bacteria ENTEROBACTER AEROGENES  Final   Organism ID, Bacteria PROTEUS MIRABILIS  Final   Organism ID, Bacteria ENTEROCOCCUS SPECIES  Final      Susceptibility   Enterobacter aerogenes - MIC*    CEFTAZIDIME <=1 SENSITIVE Sensitive     CEFAZOLIN >=64 RESISTANT Resistant     CEFTRIAXONE <=1 SENSITIVE Sensitive     CIPROFLOXACIN <=0.25 SENSITIVE Sensitive     GENTAMICIN <=1 SENSITIVE Sensitive     IMIPENEM 1 SENSITIVE Sensitive     TRIMETH/SULFA <=20 SENSITIVE Sensitive     * MODERATE GROWTH ENTEROBACTER AEROGENES   Escherichia coli - MIC*    AMPICILLIN <=2 SENSITIVE Sensitive     CEFTAZIDIME <=1 SENSITIVE Sensitive      CEFAZOLIN <=4 SENSITIVE Sensitive     CEFTRIAXONE <=1 SENSITIVE Sensitive     CIPROFLOXACIN <=0.25 SENSITIVE Sensitive     GENTAMICIN <=1 SENSITIVE Sensitive     IMIPENEM <=0.25 SENSITIVE Sensitive     TRIMETH/SULFA <=20 SENSITIVE Sensitive     * HEAVY GROWTH ESCHERICHIA COLI   Proteus mirabilis - MIC*    AMPICILLIN >=32 RESISTANT Resistant     CEFTAZIDIME <=1 SENSITIVE Sensitive     CEFAZOLIN 8 SENSITIVE Sensitive     CEFTRIAXONE <=1 SENSITIVE Sensitive  CIPROFLOXACIN <=0.25 SENSITIVE Sensitive     GENTAMICIN <=1 SENSITIVE Sensitive     IMIPENEM 1 SENSITIVE Sensitive     TRIMETH/SULFA <=20 SENSITIVE Sensitive     * PROTEUS MIRABILIS   Enterococcus species - MIC*    AMPICILLIN >=32 RESISTANT Resistant     VANCOMYCIN >=32 RESISTANT Resistant     GENTAMICIN SYNERGY SENSITIVE Sensitive     TETRACYCLINE Value in next row Resistant      RESISTANT>=16    * HEAVY GROWTH ENTEROCOCCUS SPECIES  MRSA PCR Screening     Status: None   Collection Time: 08/07/2015  2:38 PM  Result Value Ref Range Status   MRSA by PCR NEGATIVE NEGATIVE Final    Comment:        The GeneXpert MRSA Assay (FDA approved for NASAL specimens only), is one component of a comprehensive MRSA colonization surveillance program. It is not intended to diagnose MRSA infection nor to guide or monitor treatment for MRSA infections.   Blood culture (single)     Status: None   Collection Time: 08/02/2015  3:36 PM  Result Value Ref Range Status   Specimen Description BLOOD RIGHT ASSIST CONTROL  Final   Special Requests BOTTLES DRAWN AEROBIC AND ANAEROBIC  Final   Culture NO GROWTH 5 DAYS  Final   Report Status 08/17/2015 FINAL  Final  Wound culture     Status: None   Collection Time: 08/13/15 12:37 PM  Result Value Ref Range Status   Specimen Description WOUND  Final   Special Requests Normal  Final   Gram Stain   Final    FEW WBC SEEN TOO NUMEROUS TO COUNT GRAM NEGATIVE RODS FEW GRAM POSITIVE COCCI    Culture    Final    HEAVY GROWTH ESCHERICHIA COLI MODERATE GROWTH PROTEUS MIRABILIS LIGHT GROWTH KLEBSIELLA PNEUMONIAE MODERATE GROWTH ENTEROCOCCUS GALLINARUM CRITICAL RESULT CALLED TO, READ BACK BY AND VERIFIED WITH: [facility] BORBA AT 1042 08/16/15 DV    Report Status 08/17/2015 FINAL  Final   Organism ID, Bacteria ESCHERICHIA COLI  Final   Organism ID, Bacteria PROTEUS MIRABILIS  Final   Organism ID, Bacteria KLEBSIELLA PNEUMONIAE  Final   Organism ID, Bacteria ENTEROCOCCUS GALLINARUM  Final      Susceptibility   Escherichia coli - MIC*    AMPICILLIN >=32 RESISTANT Resistant     CEFTAZIDIME 4 RESISTANT Resistant     CEFAZOLIN >=64 RESISTANT Resistant     CEFTRIAXONE 16 RESISTANT Resistant     GENTAMICIN 2 SENSITIVE Sensitive     IMIPENEM >=16 RESISTANT Resistant     TRIMETH/SULFA <=20 SENSITIVE Sensitive     Extended ESBL POSITIVE Resistant     PIP/TAZO Value in next row Resistant      RESISTANT>=128    CIPROFLOXACIN Value in next row Sensitive      SENSITIVE<=0.25    * HEAVY GROWTH ESCHERICHIA COLI   Klebsiella pneumoniae - MIC*    AMPICILLIN Value in next row Resistant      SENSITIVE<=0.25    CEFTAZIDIME Value in next row Resistant      SENSITIVE<=0.25    CEFAZOLIN Value in next row Resistant      SENSITIVE<=0.25    CEFTRIAXONE Value in next row Resistant      SENSITIVE<=0.25    CIPROFLOXACIN Value in next row Resistant      SENSITIVE<=0.25    GENTAMICIN Value in next row Sensitive      SENSITIVE<=0.25    IMIPENEM Value in next row Resistant  SENSITIVE<=0.25    TRIMETH/SULFA Value in next row Resistant      SENSITIVE<=0.25    PIP/TAZO Value in next row Resistant      RESISTANT>=128    * LIGHT GROWTH KLEBSIELLA PNEUMONIAE   Proteus mirabilis - MIC*    AMPICILLIN Value in next row Resistant      RESISTANT>=128    CEFTAZIDIME Value in next row Sensitive      RESISTANT>=128    CEFAZOLIN Value in next row Sensitive      RESISTANT>=128    CEFTRIAXONE Value in next row  Sensitive      RESISTANT>=128    CIPROFLOXACIN Value in next row Sensitive      RESISTANT>=128    GENTAMICIN Value in next row Sensitive      RESISTANT>=128    IMIPENEM Value in next row Sensitive      RESISTANT>=128    TRIMETH/SULFA Value in next row Sensitive      RESISTANT>=128    PIP/TAZO Value in next row Sensitive      SENSITIVE<=4    * MODERATE GROWTH PROTEUS MIRABILIS   Enterococcus gallinarum - MIC*    AMPICILLIN Value in next row Resistant      SENSITIVE<=4    GENTAMICIN SYNERGY Value in next row Sensitive      SENSITIVE<=4    CIPROFLOXACIN Value in next row Resistant      RESISTANT>=8    TETRACYCLINE Value in next row Resistant      RESISTANT>=16    * MODERATE GROWTH ENTEROCOCCUS GALLINARUM  Wound culture     Status: None   Collection Time: 08/15/15  2:53 PM  Result Value Ref Range Status   Specimen Description WOUND  Final   Special Requests NONE  Final   Gram Stain FEW WBC SEEN NO ORGANISMS SEEN   Final   Culture NO GROWTH 3 DAYS  Final   Report Status 08/18/2015 FINAL  Final  C difficile quick scan w PCR reflex     Status: None   Collection Time: 08/17/15 11:34 AM  Result Value Ref Range Status   C Diff antigen NEGATIVE NEGATIVE Final   C Diff toxin NEGATIVE NEGATIVE Final   C Diff interpretation Negative for C. difficile  Final  Stool culture     Status: None   Collection Time: 09/03/15  3:51 PM  Result Value Ref Range Status   Specimen Description STOOL  Final   Special Requests Immunocompromised  Final   Culture   Final    NO SALMONELLA OR SHIGELLA ISOLATED No Pathogenic E. coli detected NO CAMPYLOBACTER DETECTED    Report Status 09/07/2015 FINAL  Final  C difficile quick scan w PCR reflex     Status: None   Collection Time: 09/13/15 12:51 AM  Result Value Ref Range Status   C Diff antigen NEGATIVE NEGATIVE Final   C Diff toxin NEGATIVE NEGATIVE Final   C Diff interpretation Negative for C. difficile  Final    Coagulation Studies: No results  for input(s): LABPROT, INR in the last 72 hours.  Urinalysis: No results for input(s): COLORURINE, LABSPEC, PHURINE, GLUCOSEU, HGBUR, BILIRUBINUR, KETONESUR, PROTEINUR, UROBILINOGEN, NITRITE, LEUKOCYTESUR in the last 168 hours.  Invalid input(s): APPERANCEUR  Lipid Panel:  No results found for: CHOL, TRIG, HDL, CHOLHDL, VLDL, LDLCALC  HgbA1C:  Lab Results  Component Value Date   HGBA1C 4.3* 06/16/2015    Urine Drug Screen:  No results found for: LABOPIA, COCAINSCRNUR, LABBENZ, AMPHETMU, THCU, LABBARB  Alcohol Level: No results  for input(s): ETH in the last 168 hours.    Imaging: No results found.   Assessment/Plan:  49 y.o. female  has been in medical facilities (hosp, LTAC, rehab) for 2 yrs following gastric bypass surgery with multiple complications. Now with chronic trach, ESRD, profound debilitation, severe sacral pressure ulcer. Was seemingly making progress and transferred to rehab facility approx one week prior to this admission. She was sent to [facility] ED with AMS and hypotension. Working dx of severe sepsis/septic shock due to infected sacral pressure ulcer. Also has R side externalized HD cath and tunneled L IJ CVL. She has history of resistant bacterial infections. She is on chronic steroids. Her husband indicates that she has been diagnosed with adrenal insuff at some point during the past 2 yrs    Unfortunately multiple co morbidities are contributing to her current condition.  I strongly believe she has hypoactive delirium in the setting of chronic hospitalization.   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  She does have b/l strokes which I think she can be on ASA 81 mg at this point in time.  Overall very poor prognosis for meaningful recovery for anywhere outside a permanent facility Would  strongly pursue end of life talks/hospice care with husband.    Please call with questions.    09/15/2015, 2:59 PM

## 2015-09-15 NOTE — Progress Notes (Signed)
Subjective:  Pt seen at bedside.  Had HD yesterday. Resting comfortably at the moment.   Objective:  Vital signs in last 24 hours:  Temp:  [98 F (36.7 C)-98.7 F (37.1 C)] 98.7 F (37.1 C) (10/27 0500) Pulse Rate:  [101-118] 109 (10/27 0700) Resp:  [6-39] 19 (10/27 0700) BP: (99-149)/(31-107) 135/77 mmHg (10/27 0700) SpO2:  [92 %-100 %] 99 % (10/27 0700) FiO2 (%):  [28 %] 28 % (10/27 0410) Weight:  [95 kg (209 lb 7 oz)] 95 kg (209 lb 7 oz) (10/27 0500)  Weight change:  Filed Weights   09/12/15 1725 09/12/15 2100 09/15/15 0500  Weight: 84.9 kg (187 lb 2.7 oz) 84.9 kg (187 lb 2.7 oz) 95 kg (209 lb 7 oz)    Intake/Output: I/O last 3 completed shifts: In: 2930 [NG/GT:2930] Out: 494 [Other:494]   Intake/Output this shift:     Physical Exam: General: No acute distress  Head: [place]/AT eyes open NG in place  Eyes: anicteric  Neck: Trach in place  Lungs:  Scattered rhonchi, normal effort  Heart: S1S2 slight tachycardia  Abdomen:  Soft, nontender, BS present  Extremities:  + dependent edema,   Neurologic:  awake this AM.     Access: R IJ permcath    Basic Metabolic Panel:  Recent Labs Lab 09/08/15 1945 09/12/15 0450 09/14/15 0530 09/14/15 1933  NA 136 138 139  --   K 3.5 3.9 3.9  --   CL 98* 100* 100*  --   CO2 32 31 32  --   GLUCOSE 121* 132* 154*  --   BUN 55* 72* 65*  --   CREATININE 1.31* 1.60* 1.41*  --   CALCIUM 7.5* 7.8* 7.6*  --   PHOS  --  4.5  --  3.1    Liver Function Tests:  Recent Labs Lab 09/12/15 0450  AST 16  ALT 10*  ALKPHOS 968*  BILITOT 1.0  PROT 4.2*  ALBUMIN 1.4*   No results for input(s): LIPASE, AMYLASE in the last 168 hours. No results for input(s): AMMONIA in the last 168 hours.  CBC:  Recent Labs Lab 09/09/15 0535 09/11/15 0522 09/12/15 0450 09/14/15 0530  WBC 14.7* 12.6* 13.4* 14.2*  HGB 8.1* 7.4* 7.7* 7.8*  HCT 25.5* 24.3* 24.5* 25.2*  MCV 96.4 96.2 96.4 98.3  PLT 206 179 198 192    Cardiac Enzymes: No  results for input(s): CKTOTAL, CKMB, CKMBINDEX, TROPONINI in the last 168 hours.  BNP: Invalid input(s): POCBNP  CBG:  Recent Labs Lab 09/14/15 0722 09/14/15 1348 09/14/15 1618 09/15/15 0012 09/15/15 0747  GLUCAP 132* 85 119* 130* 148*    Microbiology: Results for orders placed or performed during the hospital encounter of 08/05/2015  Blood Culture (routine x 2)     Status: None   Collection Time: 08/18/2015  8:51 AM  Result Value Ref Range Status   Specimen Description BLOOD Dolores HooseUNKO  Final   Special Requests BOTTLES DRAWN AEROBIC AND ANAEROBIC  3CC  Final   Culture NO GROWTH 5 DAYS  Final   Report Status 08/17/2015 FINAL  Final  Blood Culture (routine x 2)     Status: None   Collection Time: 08/04/2015  9:20 AM  Result Value Ref Range Status   Specimen Description BLOOD LEFT ARM  Final   Special Requests BOTTLES DRAWN AEROBIC AND ANAEROBIC  1CC  Final   Culture NO GROWTH 5 DAYS  Final   Report Status 08/17/2015 FINAL  Final  Wound culture  Status: None   Collection Time: September 04, 2015  9:20 AM  Result Value Ref Range Status   Specimen Description DECUBITIS  Final   Special Requests Normal  Final   Gram Stain   Final    FEW WBC SEEN MANY GRAM NEGATIVE RODS RARE GRAM POSITIVE COCCI    Culture   Final    HEAVY GROWTH ESCHERICHIA COLI MODERATE GROWTH ENTEROBACTER AEROGENES PROTEUS MIRABILIS HEAVY GROWTH ENTEROCOCCUS SPECIES VRE HAVE INTRINSIC RESISTANCE TO MOST COMMONLY USED ANTIBIOTICS AND THE ABILITY TO ACQUIRE RESISTANCE TO MOST AVAILABLE ANTIBIOTICS.    Report Status 08/16/2015 FINAL  Final   Organism ID, Bacteria ESCHERICHIA COLI  Final   Organism ID, Bacteria ENTEROBACTER AEROGENES  Final   Organism ID, Bacteria PROTEUS MIRABILIS  Final   Organism ID, Bacteria ENTEROCOCCUS SPECIES  Final      Susceptibility   Enterobacter aerogenes - MIC*    CEFTAZIDIME <=1 SENSITIVE Sensitive     CEFAZOLIN >=64 RESISTANT Resistant     CEFTRIAXONE <=1 SENSITIVE Sensitive      CIPROFLOXACIN <=0.25 SENSITIVE Sensitive     GENTAMICIN <=1 SENSITIVE Sensitive     IMIPENEM 1 SENSITIVE Sensitive     TRIMETH/SULFA <=20 SENSITIVE Sensitive     * MODERATE GROWTH ENTEROBACTER AEROGENES   Escherichia coli - MIC*    AMPICILLIN <=2 SENSITIVE Sensitive     CEFTAZIDIME <=1 SENSITIVE Sensitive     CEFAZOLIN <=4 SENSITIVE Sensitive     CEFTRIAXONE <=1 SENSITIVE Sensitive     CIPROFLOXACIN <=0.25 SENSITIVE Sensitive     GENTAMICIN <=1 SENSITIVE Sensitive     IMIPENEM <=0.25 SENSITIVE Sensitive     TRIMETH/SULFA <=20 SENSITIVE Sensitive     * HEAVY GROWTH ESCHERICHIA COLI   Proteus mirabilis - MIC*    AMPICILLIN >=32 RESISTANT Resistant     CEFTAZIDIME <=1 SENSITIVE Sensitive     CEFAZOLIN 8 SENSITIVE Sensitive     CEFTRIAXONE <=1 SENSITIVE Sensitive     CIPROFLOXACIN <=0.25 SENSITIVE Sensitive     GENTAMICIN <=1 SENSITIVE Sensitive     IMIPENEM 1 SENSITIVE Sensitive     TRIMETH/SULFA <=20 SENSITIVE Sensitive     * PROTEUS MIRABILIS   Enterococcus species - MIC*    AMPICILLIN >=32 RESISTANT Resistant     VANCOMYCIN >=32 RESISTANT Resistant     GENTAMICIN SYNERGY SENSITIVE Sensitive     TETRACYCLINE Value in next row Resistant      RESISTANT>=16    * HEAVY GROWTH ENTEROCOCCUS SPECIES  MRSA PCR Screening     Status: None   Collection Time: 2015/09/04  2:38 PM  Result Value Ref Range Status   MRSA by PCR NEGATIVE NEGATIVE Final    Comment:        The GeneXpert MRSA Assay (FDA approved for NASAL specimens only), is one component of a comprehensive MRSA colonization surveillance program. It is not intended to diagnose MRSA infection nor to guide or monitor treatment for MRSA infections.   Blood culture (single)     Status: None   Collection Time: 09-04-15  3:36 PM  Result Value Ref Range Status   Specimen Description BLOOD RIGHT ASSIST CONTROL  Final   Special Requests BOTTLES DRAWN AEROBIC AND ANAEROBIC  Final   Culture NO GROWTH 5 DAYS  Final   Report  Status 08/17/2015 FINAL  Final  Wound culture     Status: None   Collection Time: 08/13/15 12:37 PM  Result Value Ref Range Status   Specimen Description WOUND  Final   Special  Requests Normal  Final   Gram Stain   Final    FEW WBC SEEN TOO NUMEROUS TO COUNT GRAM NEGATIVE RODS FEW GRAM POSITIVE COCCI    Culture   Final    HEAVY GROWTH ESCHERICHIA COLI MODERATE GROWTH PROTEUS MIRABILIS LIGHT GROWTH KLEBSIELLA PNEUMONIAE MODERATE GROWTH ENTEROCOCCUS GALLINARUM CRITICAL RESULT CALLED TO, READ BACK BY AND VERIFIED WITH: [facility] BORBA AT 1042 08/16/15 DV    Report Status 08/17/2015 FINAL  Final   Organism ID, Bacteria ESCHERICHIA COLI  Final   Organism ID, Bacteria PROTEUS MIRABILIS  Final   Organism ID, Bacteria KLEBSIELLA PNEUMONIAE  Final   Organism ID, Bacteria ENTEROCOCCUS GALLINARUM  Final      Susceptibility   Escherichia coli - MIC*    AMPICILLIN >=32 RESISTANT Resistant     CEFTAZIDIME 4 RESISTANT Resistant     CEFAZOLIN >=64 RESISTANT Resistant     CEFTRIAXONE 16 RESISTANT Resistant     GENTAMICIN 2 SENSITIVE Sensitive     IMIPENEM >=16 RESISTANT Resistant     TRIMETH/SULFA <=20 SENSITIVE Sensitive     Extended ESBL POSITIVE Resistant     PIP/TAZO Value in next row Resistant      RESISTANT>=128    CIPROFLOXACIN Value in next row Sensitive      SENSITIVE<=0.25    * HEAVY GROWTH ESCHERICHIA COLI   Klebsiella pneumoniae - MIC*    AMPICILLIN Value in next row Resistant      SENSITIVE<=0.25    CEFTAZIDIME Value in next row Resistant      SENSITIVE<=0.25    CEFAZOLIN Value in next row Resistant      SENSITIVE<=0.25    CEFTRIAXONE Value in next row Resistant      SENSITIVE<=0.25    CIPROFLOXACIN Value in next row Resistant      SENSITIVE<=0.25    GENTAMICIN Value in next row Sensitive      SENSITIVE<=0.25    IMIPENEM Value in next row Resistant      SENSITIVE<=0.25    TRIMETH/SULFA Value in next row Resistant      SENSITIVE<=0.25    PIP/TAZO Value in next row  Resistant      RESISTANT>=128    * LIGHT GROWTH KLEBSIELLA PNEUMONIAE   Proteus mirabilis - MIC*    AMPICILLIN Value in next row Resistant      RESISTANT>=128    CEFTAZIDIME Value in next row Sensitive      RESISTANT>=128    CEFAZOLIN Value in next row Sensitive      RESISTANT>=128    CEFTRIAXONE Value in next row Sensitive      RESISTANT>=128    CIPROFLOXACIN Value in next row Sensitive      RESISTANT>=128    GENTAMICIN Value in next row Sensitive      RESISTANT>=128    IMIPENEM Value in next row Sensitive      RESISTANT>=128    TRIMETH/SULFA Value in next row Sensitive      RESISTANT>=128    PIP/TAZO Value in next row Sensitive      SENSITIVE<=4    * MODERATE GROWTH PROTEUS MIRABILIS   Enterococcus gallinarum - MIC*    AMPICILLIN Value in next row Resistant      SENSITIVE<=4    GENTAMICIN SYNERGY Value in next row Sensitive      SENSITIVE<=4    CIPROFLOXACIN Value in next row Resistant      RESISTANT>=8    TETRACYCLINE Value in next row Resistant      RESISTANT>=16    * MODERATE  GROWTH ENTEROCOCCUS GALLINARUM  Wound culture     Status: None   Collection Time: 08/15/15  2:53 PM  Result Value Ref Range Status   Specimen Description WOUND  Final   Special Requests NONE  Final   Gram Stain FEW WBC SEEN NO ORGANISMS SEEN   Final   Culture NO GROWTH 3 DAYS  Final   Report Status 08/18/2015 FINAL  Final  C difficile quick scan w PCR reflex     Status: None   Collection Time: 08/17/15 11:34 AM  Result Value Ref Range Status   C Diff antigen NEGATIVE NEGATIVE Final   C Diff toxin NEGATIVE NEGATIVE Final   C Diff interpretation Negative for C. difficile  Final  Stool culture     Status: None   Collection Time: 09/03/15  3:51 PM  Result Value Ref Range Status   Specimen Description STOOL  Final   Special Requests Immunocompromised  Final   Culture   Final    NO SALMONELLA OR SHIGELLA ISOLATED No Pathogenic E. coli detected NO CAMPYLOBACTER DETECTED    Report Status  09/07/2015 FINAL  Final  C difficile quick scan w PCR reflex     Status: None   Collection Time: 09/13/15 12:51 AM  Result Value Ref Range Status   C Diff antigen NEGATIVE NEGATIVE Final   C Diff toxin NEGATIVE NEGATIVE Final   C Diff interpretation Negative for C. difficile  Final    Coagulation Studies: No results for input(s): LABPROT, INR in the last 72 hours.  Urinalysis: No results for input(s): COLORURINE, LABSPEC, PHURINE, GLUCOSEU, HGBUR, BILIRUBINUR, KETONESUR, PROTEINUR, UROBILINOGEN, NITRITE, LEUKOCYTESUR in the last 72 hours.  Invalid input(s): APPERANCEUR    Imaging: No results found.   Medications:   . feeding supplement (JEVITY 1.2 CAL) 1,000 mL (09/14/15 2000)  . norepinephrine Stopped (09/10/15 0932)   . acidophilus  1 capsule Oral Daily  . albumin human  25 g Intravenous QODAY  . amantadine  200 mg Oral Q7 days  . antiseptic oral rinse  7 mL Mouth Rinse q12n4p  . chlorhexidine  15 mL Mouth Rinse BID  . epoetin (EPOGEN/PROCRIT) injection  10,000 Units Intravenous Q M,W,F-HD  . feeding supplement (PRO-STAT SUGAR FREE 64)  30 mL Per Tube 6 times per day  . FLUoxetine  10 mg Oral Daily  . free water  200 mL Per Tube 3 times per day  . heparin subcutaneous  5,000 Units Subcutaneous Q12H  . hydrocortisone  10 mg Per Tube BID  . lidocaine  1 patch Transdermal Q24H  . megestrol  400 mg Per Tube Daily  . midodrine  10 mg Oral TID WC  . multivitamin  5 mL Per Tube Daily  . nystatin   Topical TID  . pantoprazole sodium  40 mg Per Tube Q1200  . sodium chloride  10-40 mL Intracatheter Q12H  . sodium hypochlorite   Irrigation BID   sodium chloride, sodium chloride, sodium chloride, sodium chloride, acetaminophen (TYLENOL) oral liquid 160 mg/5 mL, ipratropium-albuterol, loperamide, morphine injection, oxyCODONE-acetaminophen, sodium chloride  Assessment/ Plan:  49 y.o. 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 End-stage renal disease on hemodialysis on HD MWF. The patient has been on dialysis since October of 2014. R IJ permcath.  -  Had HD yesterday, no acute indication for HD today, will plan for HD again tomorrow.   2. Anemia of CKD: pt continues to require periodic blood transfusions, epo hasn't been as effective as hoped likely due to chronic inflammation from decubitus ulcer/infection etc. -hgb currently 7.8, will continue epogen.  3. Secondary hyperparathyroidism: PTH low at 55 - phos 3.1 and acceptable.  4. Hypotension: required vasopressors earlier in admission. -as before need to use sodium modeling, midodrine, albumin, low BFRs during HD.  5.  Dispo:  Care management actively following.   LOS: 34 Jaylean Buenaventura 10/27/20169:13 AM

## 2015-09-15 NOTE — Progress Notes (Signed)
Pt was grimacing and mouthing "OW". I asked pt if she was in pain. She nodded her head yes. I asked her if her arm hurts she nodded yes. I asked her if she hurts all over and she nodded her head yes. I asked her if she wanted pain medicine she nodded yes. I asked her if she wanted tylenol she nodded her head no. I asked her if she wanted morphine and she nodded her head yes

## 2015-09-15 NOTE — Progress Notes (Signed)
Speech Therapy Note: Attended rounds this morning. MD gave order for po diet to be initiated. Reviewed rec'd diet consistency (post MBSS results) and the aspiration precautions w/ team members at rounds including the MUST wear of the PMV w/ all oral intake. Also discussed pt's increased risk for aspiration overall sec. to medical and neurological status'. Signs prepared and posted in room for education of precautions and general instructions.  ST will f/u w/ NSG at lunch today and w/ a meal tomorrow. MD/NSG agreed.

## 2015-09-15 NOTE — Progress Notes (Signed)
When doing dressing change on sacral wound, I used 14 non woven gauze soaked in Dakins solution and covered with 3 ABD pads secured with paper tape. A half rolled up pillow case that contains moisture wickening material placed at the base of the dressing.  No paper chucks placed under patient. On cloth moisture wickening chucks underneath.

## 2015-09-15 NOTE — Care Management (Signed)
It is reported during progression that during the night patient experienced pain in her right arm and wanted Tylenol rather than Morphine.  It is not clear as to how this was documented.  Neuro consult for neurological functioning assessment is pending.  Assessment to be performed this day.   Competency reassessment by psychiatry will be ordered after this neuro assessment has been perform. Tube feedings will be change to Vital in hopes of addressing possible malabsorption  issues.   Patient will be placed on diet with meal trays / snacks.  All staff will be involved in feeding attempts.    Abdominal CT without contrast is being ordered .  Husband reported to attending patient has allergy to contrast dye.  Discussed that dialysis schedule -m w f  does seem to interfere with rehab therapies as patient becomes lethargic and not able to participate.  Informed schedule was changed at patient's husband request to allow for out of state visitors on Saturdays.. CM has left voicemail message for patient's husband.

## 2015-09-15 NOTE — Progress Notes (Signed)
Nutrition Follow-up  DOCUMENTATION CODES:   Severe malnutrition in context of acute illness/injury  INTERVENTION:   Coordination of Care: discussed nutritional poc during ICU rounds with MD Mungal; pt continues with loose, yellow stools. Pt with hx of gastric sleeve (? possible SIPS procedure as well) with multiple complications and extensive GI surgery history. [facility] in hopes that the fiber-containing formula would "bulk" up her stool. This has not been the case. Discussed changing the formula to Vital with MD Mungal during rounds; this formula is used in the cases of malabsorption/maldigestion and in cases of GI intolerance including diarrhea. Recommend changing to Vital 1.2 AF and continuing rate of 50 ml/hr at present (goal rate is 65 ml/hr with Prostat 6 times daily providing 2472 kcals, 207 g of protein). MD is agreeable to changing TF formula at current rate. Per discussion during ICU rounds, TF to be held for 10 minutes prior to meal times and to be restarted after each meal.   Meals/Snacks: add small snacks between meals. Prostat to be continued but to be given via the dobhoff tube at present. If pt tolerates po intake, will add nutritional supplements as appropriate. Calorie count in progress to best assess pt's nutritional intake including bites/sips or if pt does not eat anything. Discussed calorie count with Britney RN   NUTRITION DIAGNOSIS:   Inadequate oral intake related to wound healing, acute illness, chronic illness as evidenced by NPO status, estimated needs. Continues  GOAL:   Patient will meet greater than or equal to 90% of their needs  MONITOR:    (Energy Intake, Anthropometrics, Digestive System, Electrolyte/Renal Profile, Glucose Profile)  REASON FOR ASSESSMENT:   Consult Enteral/tube feeding initiation and management  ASSESSMENT:    Pt continues with low BFR HD as scheduled, plan for diet advancement today with pt  receiving meal trays; calorie count to start. Plan for CT abdomen tomorrow. Stopping Megace today   EN: Jevity 1.2 infusing at 50 ml/hr with Prostat 6 times daily  Skin:   (stage IV sacrum, stage III hip, unstageable foot; rectal ulcer from fleixseal, ulcer in nose from dobhoff)  Digestive System: loose stools continue, 3 times during the night, pt continues with imodium daily. No other signs of intolerance (ie no vomiting, etc)  Electrolyte and Renal Profile:  Recent Labs Lab 09/08/15 1945 09/12/15 0450 09/14/15 0530 09/14/15 1933  BUN 55* 72* 65*  --   CREATININE 1.31* 1.60* 1.41*  --   NA 136 138 139  --   K 3.5 3.9 3.9  --   PHOS  --  4.5  --  3.1   Glucose Profile:  Recent Labs  09/14/15 1618 09/15/15 0012 09/15/15 0747  GLUCAP 119* 130* 148*   Meds: MVI, midodrine, aciodphilus, imodium prn daily, megace being discontinued  Height:   Ht Readings from Last 1 Encounters:  07/23/2015 5\' 9"  (1.753 m)    Weight:   Wt Readings from Last 1 Encounters:  09/15/15 209 lb 7 oz (95 kg)    Filed Weights   09/12/15 1725 09/12/15 2100 09/15/15 0500  Weight: 187 lb 2.7 oz (84.9 kg) 187 lb 2.7 oz (84.9 kg) 209 lb 7 oz (95 kg)    BMI:  Body mass index is 30.91 kg/(m^2).  Estimated Nutritional Needs:   Kcal:  [id number] kcals (BEE 1509, 1.2 AF, 1.2-1.4 IF) or 2490-2905 kcals (30-35 kcals/kg)   Protein:  125-166 g (1.5-2.0 g/kg) but likely closer to 166-208 g (  2.0-2.5 g/kg)   Fluid:  1000 mL plus UOP  EDUCATION NEEDS:   No education needs identified at this time  HIGH Care Level  Romelle Starcher MS, RD, LDN [phone] Pager

## 2015-09-15 NOTE — Progress Notes (Signed)
Physical Therapy Treatment Patient Details Name: Courtney [name] MRN: [id number] DOB: 1966/04/06 Today's Date: 09/15/2015    History of Present Illness Pt is a 34 F who has been in medical facilities (hosp, LTAC, rehab) for 2 yrs following gastric bypass surgery with multiple complications. Now with chronic trach, ESRD, profound debilitation, severe sacral pressure ulcer (stage IV). Was seemingly making progress and transferred to rehab facility approx one week prior to this admission. She was sent to [facility] ED 9/23 with AMS and hypotension. Admitted with sepsis related to infected sacral ulcer.  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  Patient transferred to CCU to med-surg floor during hospitalization, but experienced profound interdialytic hypotension requiring return transfer to CCU for use of pressors (now off).  New orders received to resume PT; cleared by RN for participation as tolerated.    PT Comments    Patient tolerating session with PMV this date without difficulty; vitals stable and WFL throughout (donned and removed per RN).  Patient initially greeting therapist with "good afternoon" after max encouragement; refusing further attempts at verbalization with exception to "ouch" during all movement attempts to R LE.  Does appear to nod yes/no appropriately throughout session. Patient attempting L UE active effort x1-2 reps and trace active movement of L LE hip adduct x1-2 reps; patient without active effort for additional reps this date despite max encouragement. Will continue progression towards mobility as patient tolerates/appropriate (RN advised against this date due to pain and lack of available medication at this time).   Follow Up Recommendations  LTACH;SNF     Equipment Recommendations       Recommendations for Other  Services       Precautions / Restrictions Precautions Precautions: Fall Precaution Comments: stage 4 sacral ulcer, NG tube/NPO, trach, contact isolation, R IJ permcath, L IJ central line Restrictions Weight Bearing Restrictions: No    Mobility  Bed Mobility                  Transfers                    Ambulation/Gait                 Stairs            Wheelchair Mobility    Modified Rankin (Stroke Patients Only)       Balance                                    Cognition Arousal/Alertness: Awake/alert Behavior During Therapy: Flat affect                        Exercises Other Exercises Other Exercises: Act assist/passive ROM to bilat UEs/LEs, 1x10: gross grasp/release, wrist flext/ext, forearm pronation/supination, elbow flex/ext, shoulder flex/ext/abduct/adduct.  Patient attempting 1-2 active reps of L UE grasp/release and wrist flex/ext; refused active effort otherwise. Other Exercises: Passive ROM to bilat LEs, 1x10: ankle pumps, hip abduct/adduct and hip/knee flex/ext.  Patient with significant grimacing, vocalizing "ouch" with any movement of R LE; refused to verbalize or describe further. Other Exercises: Extensive encouragement to promote active verbalization throughout session--patient initially greeting therapist with "good afternoon" (with max prompting), but refusing further attempts at verbalization with exception to "ouch" (during movement of  R LE).  Consistently nodding head yes/no (seemingly appropriately).    General Comments        Pertinent Vitals/Pain Pain Assessment: Faces Faces Pain Scale: Hurts whole lot Pain Location: R LE Pain Descriptors / Indicators: Moaning ("ouch, ouch" with movement of R LE) Pain Intervention(s): Limited activity within patient's tolerance;Monitored during session;Repositioned    Home Living                      Prior Function            PT Goals  (current goals can now be found in the care plan section) Acute Rehab PT Goals Patient Stated Goal: patient unable/refusing to verbalize PT Goal Formulation: Patient unable to participate in goal setting Time For Goal Achievement: 09/22/15 Potential to Achieve Goals: Fair Progress towards PT goals: Not progressing toward goals - comment    Frequency  Min 2X/week    PT Plan Current plan remains appropriate    Co-evaluation             End of Session   Activity Tolerance: Patient limited by pain Patient left: in bed;with call bell/phone within reach     Time: 1500-1527 PT Time Calculation (min) (ACUTE ONLY): 27 min  Charges:  $Therapeutic Exercise: 23-37 mins                    G Codes:      Cassey Hurrell H. Manson PasseyBrown, PT, DPT, NCS 09/15/2015, 5:06 PM [phone]

## 2015-09-15 NOTE — Progress Notes (Addendum)
Placed speaking valve in and set patient up to eat lunch. I said to patient "Good Afternoon [name]."  The patient repeated "Good Afternoon [name]". I asked her if she was ready to eat lunch and she nodded her head yes. I tried to give the patient a half of a teaspoon of mashed potatoes and shook her head no and turned her head away from the spoon. I asked her if she wanted any yogurt and she shook her head no. I continued in this fashion until I had went through all the different foods on her tray. I asked the patient was she hungry and she shook her head no. I asked her if I could hear her words that she did not want to eat and she nodded her head yes that she would tell me. I asked her again if she wanted to eat and she voiced "No".

## 2015-09-16 ENCOUNTER — Inpatient Hospital Stay: Payer: 59

## 2015-09-16 LAB — GLUCOSE, CAPILLARY
GLUCOSE-CAPILLARY: 124 mg/dL — AB (ref 65–99)
GLUCOSE-CAPILLARY: 90 mg/dL (ref 65–99)
GLUCOSE-CAPILLARY: 67 mg/dL (ref 65–99)

## 2015-09-16 LAB — CBC WITH DIFFERENTIAL/PLATELET
BASOS PCT: 0 %
Basophils Absolute: 0 10*3/uL (ref 0–0.1)
EOS ABS: 0 10*3/uL (ref 0–0.7)
Eosinophils Relative: 0 %
HCT: 25.4 % — ABNORMAL LOW (ref 35.0–47.0)
Hemoglobin: 8.2 g/dL — ABNORMAL LOW (ref 12.0–16.0)
LYMPHS ABS: 1.1 10*3/uL (ref 1.0–3.6)
Lymphocytes Relative: 6 %
MCH: 32.7 pg (ref 26.0–34.0)
MCHC: 32.2 g/dL (ref 32.0–36.0)
MCV: 101.5 fL — ABNORMAL HIGH (ref 80.0–100.0)
MONO ABS: 1.1 10*3/uL — AB (ref 0.2–0.9)
MONOS PCT: 6 %
Neutro Abs: 15.8 10*3/uL — ABNORMAL HIGH (ref 1.4–6.5)
Neutrophils Relative %: 88 %
Platelets: 236 10*3/uL (ref 150–440)
RBC: 2.5 MIL/uL — ABNORMAL LOW (ref 3.80–5.20)
RDW: 23.3 % — AB (ref 11.5–14.5)
WBC: 18.1 10*3/uL — ABNORMAL HIGH (ref 3.6–11.0)

## 2015-09-16 LAB — COMPREHENSIVE METABOLIC PANEL
ALBUMIN: 1.5 g/dL — AB (ref 3.5–5.0)
ALK PHOS: 1008 U/L — AB (ref 38–126)
ALT: 13 U/L — AB (ref 14–54)
AST: 19 U/L (ref 15–41)
Anion gap: 6 (ref 5–15)
BUN: 62 mg/dL — AB (ref 6–20)
CALCIUM: 8 mg/dL — AB (ref 8.9–10.3)
CO2: 31 mmol/L (ref 22–32)
CREATININE: 1.25 mg/dL — AB (ref 0.44–1.00)
Chloride: 101 mmol/L (ref 101–111)
GFR calc non Af Amer: 49 mL/min — ABNORMAL LOW (ref >60)
GFR, EST AFRICAN AMERICAN: 57 mL/min — AB (ref >60)
GLUCOSE: 146 mg/dL — AB (ref 65–99)
Potassium: 3.8 mmol/L (ref 3.5–5.1)
SODIUM: 138 mmol/L (ref 135–145)
Total Bilirubin: 0.5 mg/dL (ref 0.3–1.2)
Total Protein: 4.4 g/dL — ABNORMAL LOW (ref 6.5–8.1)

## 2015-09-16 MED ORDER — HEPARIN SODIUM (PORCINE) 1000 UNIT/ML IJ SOLN
1000.0000 [IU] | Freq: Once | INTRAMUSCULAR | Status: AC
  Administered 2015-09-16: 4500 [IU]

## 2015-09-16 MED ORDER — SODIUM CHLORIDE 0.9 % IV SOLN
500.0000 mg | INTRAVENOUS | Status: AC
  Administered 2015-09-16: 500 mg via INTRAVENOUS
  Filled 2015-09-16: qty 10

## 2015-09-16 MED ORDER — SODIUM CHLORIDE 0.9 % IV SOLN
6.0000 mg/kg | INTRAVENOUS | Status: DC
  Administered 2015-09-18: 558 mg via INTRAVENOUS
  Filled 2015-09-16: qty 11.16

## 2015-09-16 MED ORDER — SODIUM CHLORIDE 0.9 % IV SOLN
500.0000 mg | INTRAVENOUS | Status: DC
  Administered 2015-09-16 – 2015-09-18 (×3): 500 mg via INTRAVENOUS
  Filled 2015-09-16 (×4): qty 0.5

## 2015-09-16 MED ORDER — DIATRIZOATE MEGLUMINE & SODIUM 66-10 % PO SOLN
30.0000 mL | Freq: Once | ORAL | Status: AC
  Administered 2015-09-16: 30 mL via NASOGASTRIC

## 2015-09-16 NOTE — Progress Notes (Addendum)
[facility] Note  Name: Courtney [name] MRN: [id number] DOB: [birth year]    ADMISSION DATE:  08/10/2015   INTERVAL HISTORY: 10/28-patient with mildly depressed mentation this morning, seems a little bit more somnolent than usual. We'll move on an open eyes but not as interactive. Take half a teaspoon of liquids yesterday by mouth with Passy-Muir valve in place. Overnight increasing white cell count 18,000, CT abdomen and pelvis done showed a right hip fracture small, unable to properly locate the metallic tip of the gastric tube, recommended fluoroscopic study. CT also showed osteomyelitis. Plan today is to resume daptomycin and meropenem, stop amantadine due to elevated alkaline phosphatase, orthopedic evaluation for mild/small right hip fracture.  10/27-showing large bowel movement last night, yellow in nature. Today team and decided to get a CT abdomen and pelvis without contrast to evaluate for diverticulitis/colitis/sigmoiditis and overall anatomy of her bowels. Change tube feeds from Jevity to Vita.  Patient tolerating speaking valve, we'll attempt to feed with the use of a speaking valve while still having them off in place. Tube feedings to stop 10 minutes before attempting by mouth feeds. 0.$RemoveBefore'5mg'[id number]$  Dilaudid PRN with sacral dressing changes only.    10/26- had 3 loose stools overnight, still off vasopressors. Blood pressure stable overnight, no fever, white cell count mildly elevated at 14.2, small ulcer on lateral aspect of right foot and right ankle. Increase tube feeds to 50 mL. HD today  10/25- off vasopressors, prozac started yesterday, BP stable overnight, abx stopped yesterday, seen by PT and speech tx yesterday, mild inc in wbc, recheck labs in the AM. Two loose stools yesterday, cdiff neg.  10/24-off vasopressors, started prozac today, HD this PM and assess BP and vasopressors needs Follow CBC, repeat CXR pending, plan for Speech for small oral  feeds, increased TF rate to 40 (goal is 60), abx stopped for now,    10/23- off vasopressors, 1 soft BM overnight, low H/H this AM, husband at bedside.   10/22 - off vasopressors, tolerated slow HD yesterday, tracking with eyes, and will nod head to answer questions. Sacral decub debrided at bedside yesterday. Restarted on levophed 19mcg during dialysis, stopped this AM.   10/21-off vasopressors s/p wound debridement at bedside by gen surgery Morphine given for severe pain, plan for slow HD today and assess BP Continue step down monitoring, plan for TEE next week will discuss with Husband  10/20-transferred to step down for sepsis/aspiration pneumonia, weaned off levophed this AM -abx adjusted, continue TF's, will NOT start oral feeds Plan for TEE at some point, appreciate surgical input, obtain CBC,chem 7, CXR as needed Will update husband today  10/19-patient with minimal amounts of food this AM, will plan to continue oral intake along with Tube feedings and assess calorie count This was discussed with Husband, plan to stop TF's tomorrow if she tolerates oral feeds and remove NG tube in next 2 days Will talk and discuss with Husband that patient is very high risk for aspiration-will obtain Surgery consult to reassess decub ulcer Will consult cardiology to assess for TEE  10/18 discussed with husband-patient passed swallow eval spoke with Speech Therapist Will add Pureed thick nectar foods and continue Enteral feeds through NG tube.  Will add Megace   On 10/16  the patient's husband was upset due to placement of a rectal tube by on-call physician, this was removed. It was also noted that the pt developed diarrhea, therefore Jevity was changed from 1.5 with fiber to [employer]  1.2; the rate was decreased to 20 cc/hr and the pt was given a dose of lomotil, which has helped the diarrhea. It was also sent for stool culture and Cdif.   On 10/17 discussed with husband options going forward. He would  like to transfer to [facility] as it is closer to him, also  [facility] is there so he can have his wife evaluated better and that he can have the [employer] come and check on her status personally-Dr. Mortimer Fries has discussed case with Hospitalist and they pretty much have refused to accept her at this time.   -He declines transfer of his wife back to [place].   -He would like to repeat speech evaluation and Pasey-Muir valve as he hopes that his wife will be able to eat and then will not need a feeding tube. He understands that she may be "stuck" in the hospital as currently no local LTAC is accepting her. He is continuing discussions with [facility].   -He would like his wife restarted on aspirin due to stroke risk. It was explained that given her life threatening bleed the rounding physician did not feel comfortable restarting this medication at this time, but he could revisit this discussion with other physicians in the future.    INITIAL PRESENTATION:  89 F who has been in medical facilities (hosp, LTAC, rehab) for 2 yrs following gastric bypass surgery with multiple complications. Now with chronic trach, ESRD, profound debilitation, severe sacral pressure ulcer. Was seemingly making progress and transferred to rehab facility approx one week prior to this admission. She was sent to [facility] ED with AMS and hypotension. Working dx of severe sepsis/septic shock due to infected sacral pressure ulcer. Also has R side externalized HD cath and tunneled L IJ CVL. She has history of resistant bacterial infections. She is on chronic steroids. Her husband indicates that she has been diagnosed with adrenal insuff at some point during the past 2 yrs  MAJOR EVENTS/TEST RESULTS: Admission 02/07/14-05/07/14 Admission 07/21/14-09/06/14 Discharged to [facility]. Pt had palliative consult at that time, were asked to sign off by husband.  09/23 CT head: NAD 09/23 EEG: no epileptiform activity 09/23 PRBCs for Hgb 6.4 09/24 bedside debridement of  sacral wound. Abscess drained 09/25 Off vasopressors. More alert. No distress. Worsening thrombocytopenia. Vanc DC'd 09/29  Dr. Ola Spurr (I.D) excused from the case by patient's husband. 10/02 MRI -multiple infarcts 10/03 tracheal bleeding- transfused platelets 10/03 hospitalist service excused from the case by patient's husband 10/03 Echocardiogram ejection fraction was 55-60%, pulmonary systolic pressure was 39 mmHg 10/08 restart TF's at lower rate, attempt reg HD  10/12 Transferred to med-surg floor. Remains on PCCM service 10/14 SLP eval: pt unable to tolerate PMV adequately 10/17-will re-attempt PM valve-discussed with Speech therapist 10/18 passed swallow eval-start pureed thick foods no thin liquids-continue NG feeds 10/19 transferred to step down for sepsis/aspiration pneumonia 10/19 cxr shows RLL opacity 10/21 sacral decub debride at bedside by surgery 10/22 started back on vasopressors while on HD 10/27 CT with osteo, R hip fx, unable to identify tip of dubhoff tube - sent for fluro study 10/28 fluoro study of dubhoff tube>>  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 History of sepsis from C. glabrata MRSA PCR 9/23 >> NEG Wound (swab) 9/23 >> multiple organisms Wound (debridement) 9/24 >> Enterococcus,  K. Pneumoniae, P. Mirabilis, VRE Wound 9/26 >> No growth Blood 9/23 >> neg, recheck 10/28>> CDiff 9/27>>neg Stool Cx 10/15>> negative Cdiff 10/25>>neg Trach Aspirate 10/28>>  ANTIMICROBIALS:  Aztreonam 9/23 >> 9/24 Vanc 9/23 >> 9/25 Vanc 9/26>>9/27 Daptomycin 9/27>> 10/12, restarted 10/28 Meropenem 9/23 >> 10/14, restarted 10/28   SUBJECTIVE:  RASS 0. More somnolent compared to yesterday, moves left arm however not as interactive.  VITAL  SIGNS: Temp:  [98.7 F (37.1 C)-99.1 F (37.3 C)] 98.7 F (37.1 C) (10/28 0800) Pulse Rate:  [78-122] 104 (10/28 1000) Resp:  [13-32] 16 (10/28 1000) BP: (111-156)/(75-99) 150/76 mmHg (10/28 1000) SpO2:  [95 %-100 %] 100 % (10/28 1000) FiO2 (%):  [28 %] 28 % (10/28 0429) Weight:  [205 lb 0.4 oz (93 kg)] 205 lb 0.4 oz (93 kg) (10/28 0558) HEMODYNAMICS:   VENTILATOR SETTINGS: Vent Mode:  [-]  FiO2 (%):  [28 %] 28 % INTAKE / OUTPUT:  Intake/Output Summary (Last 24 hours) at 09/16/15 1106 Last data filed at 09/16/15 1000  Gross per 24 hour  Intake 1767.5 ml  Output      1 ml  Net 1766.5 ml    PHYSICAL EXAMINATION: Physical Examination:   VS: BP 150/76 mmHg  Pulse 104  Temp(Src) 98.7 F (37.1 C) (Oral)  Resp 16  Ht $R'5\' 9"'rI$  (1.753 m)  Wt 205 lb 0.4 oz (93 kg)  BMI 30.26 kg/m2  SpO2 100%  LMP  (LMP Unknown)  General Appearance: No resp distress, coarse upper airway sounds  Neuro: profoundly diffusely weak HEENT: cushingoid facies, PERRLA, EOM intact Neck: trach site clean Pulmonary: clear anteriorly Cardiovascular: reg, no M  Abdomen: soft, NT, +BS Extremities: warm, R>L UE edema    LABORATORY PANEL:   CBC  Recent Labs Lab 09/16/15 0424  WBC 18.1*  HGB 8.2*  HCT 25.4*  PLT 236    Chemistries   Recent Labs Lab 09/14/15 1933 09/16/15 0424  NA  --  138  K  --  3.8  CL  --  101  CO2  --  31  GLUCOSE  --  146*  BUN  --  62*  CREATININE  --  1.25*  CALCIUM  --  8.0*  PHOS 3.1  --   AST  --  19  ALT  --  13*  ALKPHOS  --  1008*  BILITOT  --  0.5     CXR 10/24: nnf   IMPRESSION/PLAN: PULMONARY A: 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 If patient has CIM/CIP will require muscle biopsy and EMG - this  can be taken addressed later.   CARDIOVASCULAR A:  Septic shock, resolved - WBC slowly increasing, 18 today, will continue to monitor and restart abx Hemorrhagic shock, resolved Chronic hypotension P:  MAP goal > 55 mmHg - cont with cortef and midodrine  RENAL A:  ESRD  P:  Monitor BMET intermittently Monitor I/Os Correct electrolytes as indicated Intermittent HD per Renal Service  GASTROINTESTINAL A:  LGIB, resolved Hx of C. Diff Stool incontinence - yellowish Dysphagia Elevated LFTs (alk phos) P:  Cont PPI Avoid anticoagulants Increase tube feeds to 50 mL (10/26), goal is 60 Immodium PRN QD Patient tolerating speaking valve, we'll attempt to feed by mouth with speaking valve in place, hold tube  feeds 10 minutes before attempting by mouth feeding. Oral Feeding is to be done under direct observation of medical staff at all times. CT abdomen and pelvis unable to identify tip of the above 2, recommending fluoroscopic study Hold amantadine secondary to elevated LFTs, and recheck in the AM.   HEMATOLOGIC A:  Acute on chronic anemia Thrombocytopenia - HIT panel negative 9/25 P:  DVT px: SCDs, resume SQ heparin 10/14 Monitor CBC intermittently Transfuse per usual guidelines, Hb=8.2, recheck in the AM.  plt count 198 10/17 >>  206 on 10/21 >>   198 on 10/24   10/28>>236  INFECTIOUS A:  Severe sepsis-WBC count slowly increasing, 18 today, restart abx and reculture 10/28 Sacral decubitus ulcer with abscess ?Osteomyelitis P:  Monitor temp, WBC count-if WBC count continues to increase, will need to consider restarting antibiotics -0.5mg  Dilaudid PRN with sacral dressing changes only.  Micro and abx as above-off abx now -follow up gen surgery recs -Restart meropenem and daptomycin, recheck blood culture, recheck sputum culture ENDOCRINE A:  DM 2, controlled. Not requiring insulin coverage Chronic steroid therapy (cortef) P:  Monitor CBGs q 8 hrs -  resume SSI if > 180 Cont hydrocortisone @ 10 mg BID  MSK A: Right hip fx - small intertrochanteric R fx - pain management and ortho eval.    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 and [facility] We are seeking return to [facility] but she cannot go there with NGT and husband has been reluctant to consent to G-tube placement Care management very involved 10/28 spoke with husband over the phone, updated him on patient's clinical status.  I have personally obtained a history, examined the patient, evaluated Pertinent laboratory and Radio[employer]/imaging results, and  formulated the assessment and plan The Patient requires high complexity decision making for assessment and support, frequent evaluation and titration of therapies. Updated Husband  Critical Care time - 48 mins  Vilinda Boehringer, MD [facility] Pager [phone] (please enter 7-digits) On Call Pager - [phone] (please enter 7-digits)

## 2015-09-16 NOTE — Consult Note (Signed)
ORTHOPAEDIC CONSULTATION  REQUESTING PHYSICIAN: Shane Crutch, MD  Chief Complaint:   Right hip pain  History of Present Illness: Courtney [name] is a 49 y.o. female with multiple medical issues stemming from complications which developed after a gastric bypass procedure over 2 years ago. Since this procedure, she has undergone multiple abdominal procedures and has been in the hospital or rehabilitation setting for over 2 years. The patient recently was readmitted to the hospital about 5 weeks ago secondary to a septic episode requiring pressor support. She has recently been weaned off of these pressors but continues to be treated for a large stage IV sacral decubitus ulcer which has been treated debrided several times and presently is being treated with daily dressing changes. The patient has a permanent trach and is on a trach collar. She is receiving TPN nutrition and has multiple drug resistant organisms cultured from her. She has been nonambulatory for nearly 2 years. 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 Recently, she has been started with physical therapy to work on some upper and lower extremity range of motion exercises since being weaned off of the pressor support, but has complained of right hip pain whenever this leg is moved. A CT scan was obtained which confirmed the presence of an essentially nondisplaced intertrochanteric fracture. It is unclear as to when this may have occurred as there has been no obvious history of trauma recently.  Past Medical History  Diagnosis Date  . Obesity   . Dyslipidemia   . Hypertension   . Coronary artery disease     s/p BMS 2010 LAD  . Dysrhythmia     ventricular tachycardia resolved after LAD stent and beta blocker  . Diabetes mellitus     with retinopathy, neuropathy and microalbuminemia  . ESRD (end stage renal disease) on  dialysis    Past Surgical History  Procedure Laterality Date  . Cholecystectomy  1990  . Pci lad  12/2010  . Colonoscopy  08/2011  . Left heart catheterization with coronary angiogram N/A 01/28/2012    Procedure: LEFT HEART CATHETERIZATION WITH CORONARY ANGIOGRAM;  Surgeon: Lesleigh Noe, MD;  Location: [facility] CATH LAB;  Service: Cardiovascular;  Laterality: N/A;  . Tracheostomy    . Gastric bypass     Social History   Social History  . Marital Status: Married    Spouse Name: N/A  . Number of Children: N/A  . Years of Education: N/A   Social History Main Topics  . Smoking status: Never Smoker   . Smokeless tobacco: Never Used  . Alcohol Use: No     Comment: occassional  . Drug Use: No  . Sexual Activity: Yes   Other Topics Concern  . None   Social History Narrative   Family History  Problem Relation Age of Onset  . Hypertension Father   . Diabetes Father   . Cancer Mother   . Diabetes Paternal Grandfather   . Hypertension Paternal Grandfather   . Diabetes Paternal Grandmother   . Hypertension Paternal Grandmother   . Cancer Maternal Grandmother     colon ca   Allergies  Allergen Reactions  . Contrast Media [Iodinated Diagnostic Agents] Anaphylaxis  . Ampicillin Rash   Prior to Admission medications   Medication Sig Start Date End Date Taking? Authorizing Provider  b complex-vitamin c-folic acid (NEPHRO-VITE) 0.8 MG TABS tablet Take 1 tablet by mouth every evening.   Yes Historical Provider, MD  carvedilol (COREG) 25  MG tablet Take 25 mg by mouth every evening.   Yes Historical Provider, MD  collagenase (SANTYL) ointment Apply 1 application topically daily.   Yes Historical Provider, MD  Darbepoetin Alfa (ARANESP) 100 MCG/0.5ML SOSY injection Inject 100 mcg into the skin every 7 (seven) days. On Saturday at dialysis   Yes Historical Provider, MD  escitalopram (LEXAPRO) 5 MG tablet Take 5 mg by mouth at bedtime.   Yes Historical Provider, MD  heparin 5000 UNIT/ML  injection Inject 5,000 Units into the skin every 8 (eight) hours.   Yes Historical Provider, MD  insulin lispro (HUMALOG) 100 UNIT/ML injection Inject into the skin 4 (four) times daily -  before meals and at bedtime. Per sliding scale   Yes Historical Provider, MD  insulin lispro (HUMALOG) 100 UNIT/ML injection Inject into the skin 3 (three) times daily. Per sliding scale   Yes Historical Provider, MD  lidocaine (LIDODERM) 5 % Place 1 patch onto the skin daily. Remove & Discard patch within 12 hours or as directed by MD   Yes Historical Provider, MD  lisinopril (PRINIVIL,ZESTRIL) 40 MG tablet Take 40 mg by mouth every evening.   Yes Historical Provider, MD  megestrol (MEGACE) 400 MG/10ML suspension Take 800 mg by mouth 2 (two) times daily.   Yes Historical Provider, MD  mirtazapine (REMERON) 15 MG tablet Take 15 mg by mouth at bedtime.   Yes Historical Provider, MD  oxyCODONE (OXY IR/ROXICODONE) 5 MG immediate release tablet Take 5 mg by mouth every 8 (eight) hours.   Yes Historical Provider, MD  oxyCODONE (OXY IR/ROXICODONE) 5 MG immediate release tablet Take 5 mg by mouth every 8 (eight) hours as needed for moderate pain.   Yes Historical Provider, MD  potassium chloride SA (K-DUR,KLOR-CON) 20 MEQ tablet Take 20 mEq by mouth every evening.   Yes Historical Provider, MD  predniSONE (DELTASONE) 10 MG tablet Take 10 mg by mouth 2 (two) times daily.   Yes Historical Provider, MD  psyllium (METAMUCIL) 58.6 % powder Take 1 packet by mouth 2 (two) times daily.   Yes Historical Provider, MD  thiamine (VITAMIN B-1) 100 MG tablet Take 100 mg by mouth every evening.   Yes Historical Provider, MD   Ct Abdomen Pelvis Wo Contrast  09/16/2015  CLINICAL DATA:  49 year old female with multiple loose bowel movements. Concern for diverticulitis or colitis. EXAM: CT ABDOMEN AND PELVIS WITHOUT CONTRAST TECHNIQUE: Multidetector CT imaging of the abdomen and pelvis was performed following the standard protocol without  IV contrast. COMPARISON:  No priors. FINDINGS: Lower chest: Atherosclerotic calcifications left main, left anterior descending, left circumflex and right coronary arteries. Catheter tip terminating in the right atrium. Trace bilateral pleural effusions. Areas of consolidation and atelectasis in the lower lobes of the lungs bilaterally where there is some mild cylindrical bronchiectasis. Hepatobiliary: Diffuse capsular calcifications along the surface of the liver. No discrete cystic or solid hepatic lesions are confidently identified on today's noncontrast CT examination. Gallbladder is not confidently identified and may be surgically absent. Pancreas: No definite pancreatic mass or peripancreatic inflammatory changes. Spleen: Capsular calcifications along the surface of the spleen. Otherwise, unremarkable. Adrenals/Urinary Tract: Bilateral perinephric stranding (nonspecific). The unenhanced appearance of the kidneys and bilateral adrenal glands is otherwise unremarkable. The unenhanced appearance of the urinary bladder is normal. Stomach/Bowel: Numerous calcifications are noted along the surface of the stomach. 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 Notably, the patient indwelling feeding tube extends through the  stomach into the right side of the upper abdomen in a very superficial location immediately deep to the right lateral abdominal wall musculature. It is uncertain whether or not the tip of the tube is within the lumen of the bowel, or has extended beyond the lumen of the bowel (image 44 of series 2). The exact type of gastric bypass is uncertain on today's examination, as the anatomy is highly atypical. The third portion of the duodenum traverses beneath the superior mesenteric artery, excluding the possibility of small bowel malrotation as an etiology for this abnormal appearance. Vascular/Lymphatic: Extensive atherosclerotic calcifications  throughout the abdominal and pelvic vasculature, without definite aneurysm. No lymphadenopathy confidently identified in the abdomen or pelvis on today's noncontrast CT examination. Reproductive: Uterus and ovaries are atrophic. Other: Extensive calcifications throughout the peritoneal cavity, presumably from prior episodes of peritonitis. Diffuse mesenteric edema. Trace volume of ascites. No pneumoperitoneum. Musculoskeletal: Diffuse severe body wall edema. Deep decubitus ulcers in the right gluteal and sacral region, which extend to the underlying bone. The sacrum and coccyx are not visualized beyond S3, either surgically resected or destroyed by osteomyelitis. There is also destruction of the disc space and portions of vertebral bodies at T8-T9, suggesting discitis/osteomyelitis. Severe diffuse osteopenia, with multiple old healed bilateral rib fractures. In addition, there is a common and intertrochanteric fracture of the right hip with only minimal displacement. IMPRESSION: 1. Exceedingly limited study secondary to lack of IV contrast. No definite findings to suggest diverticulitis. Assessment of the colon is limited by lack of oral and IV contrast, but there are no overt inflammatory changes to strongly suggest a colitis at this time. 2. Patient's postoperative anatomy following gastric bypass is highly unusual and uncertain on today's examination. Patient does have an indwelling feeding tube, but the metallic tip of the feeding tube is in an unusual very superficial position in the right side of the abdomen, with an appearance whichcould suggest perforation beyond the lumen of the bowel. This could be better evaluated with followup injection of the feeding tube under fluoroscopic visualization. 3. Diffuse body wall and mesenteric edema with trace volume of ascites and trace bilateral pleural effusions, compatible with a underlying state of anasarca. 4. Severe calcifications throughout the peritoneal cavity,  presumably secondary to prior episodes of peritonitis. 5. Severe osteopenia in the bones, with a mildly comminuted intertrochanteric fracture of the right hip. There also changes at T8-T9, suggestive of discitis/osteomyelitis. 6. Deep right gluteal and sacral decubitus ulcers extending to the underlying bone, with complete absence of the distal sacrum beyond S3 and the entire coccyx, which could be postoperative, or could be secondary to chronic osteomyelitis incomplete bony destruction. 7. Additional incidental findings, as above. Electronically Signed   By: Trudie Reed M.D.   On: 09/16/2015 01:26   Dg Abd 1 View  09/16/2015  CLINICAL DATA:  Cough opt tube evaluation EXAM: ABDOMEN - 1 VIEW COMPARISON:  CT last night FINDINGS: Contrast has been injected into the feeding tube. And ill-defined cavity is seen filled with contrast surrounding the tip of the feeding tube. Prior to this cavity, the feeding tube traverses duodenum. Duodenal loops filled with contrast likely due to retrograde passage of contrast from the cavity, along the tract, through the perforation and into duodenal loops. There is no extravasation of contrast into the peritoneal space. IMPRESSION: The study confirms that the feeding tube pes perforated through the duodenum and the tip is within a cavity that fills with injected contrast. The cavity does appear walled off.  Electronically Signed   By: Jolaine Click M.D.   On: 09/16/2015 16:05   Dg Chest Port 1 View  09/16/2015  CLINICAL DATA:  Respiratory failure and increasing white blood cell count. EXAM: PORTABLE CHEST 1 VIEW COMPARISON:  09/12/2015 FINDINGS: Right jugular dialysis catheter tip at the cavoatrial junction. There is a left jugular central line that terminates in the upper SVC region. Feeding tube extends into the abdomen but the tip is beyond the image. There is a tracheostomy tube. Again noted are slightly low lung volumes without focal airspace disease. No pulmonary edema.  Heart size is within normal limits. Negative for a pneumothorax. IMPRESSION: No acute chest findings. Support apparatuses as described. Electronically Signed   By: Richarda Overlie M.D.   On: 09/16/2015 15:23    Positive ROS: All other systems have been reviewed and were otherwise negative with the exception of those mentioned in the HPI and as above.  Physical Exam: General:  Alert, no acute distress Psychiatric:  Patient appears depressed and it is unclear as to whether she is fully oriented   Cardiovascular:  Mild pedal edema bilaterally Respiratory:  No wheezing, non-labored breathing. Patient is on a trach collar. GI:  Abdomen is soft and non-tender Skin: See below Neurologic:  See below Lymphatic:  No axillary or cervical lymphadenopathy  Orthopedic Exam:  Orthopedic examination is limited to the right hip and lower extremity. Skin inspection is notable for a healing epithelialized stage III decubitus ulcer over the right trochanteric region. At present, it measures approximately 1.5-2 cm in diameter and has no drainage. The skin about her lateral thigh is edematous but not erythematous and otherwise unremarkable. Inspection of her right lower leg demonstrates significant atrophy of the lower leg musculature. Developing decubiti of the medial and lateral aspects of her right ankle, her right heel, and a right lateral forefoot are presently covered. She has dry eschars over the dorsal aspects of several toe PIP joints. She is unable to actively dorsiflex or plantarflex her toes or ankle. It is difficult to determine if she even has any sensation to touch or pressure in her foot. She does have pain with any attempted active or passive motion of the hip. We did not roll her up on her left side to look at her large sacral decubitus, but the nurse who changes the dressing daily has told me that it measures approximately 8 cm in diameter, goes down to the sacrum, and extends several inches into both  regions.  X-rays:  A CT scan of the abdomen and pelvis has been obtained and is available for review. By report, there is a three-part essentially nondisplaced fracture of the right intertrochanteric region of uncertain age. No obvious osteolysis is noted to suggest osteomyelitis in this part of the femur. However, there is extensive erosion of the lower part of the sacrum as a result of the large sacral decubitus, as well as evidence of erosive changes of T8 and T9, possibly consistent with osteomyelitis or discitis in this area.  Assessment: Non-displaced intertrochanteric fracture right hip.  Plan: The treatment options were discussed with the patient and her husband (by phone). Normally, we would consider stabilizing the fracture with either a compression screw and side plate or an intramedullary device. However, given her multiple medical issues, the large sacral decubitus ulcer, her constant bouts with sepsis, her poor nutritional status, and her complete immobility, I do not feel that she is a surgical candidate. Therefore, I feel that it would best  to manage this fracture nonsurgically and allow it to heal by itself over time, which it should. As I explained to the patient's husband, it would only make things worse if I were to make an incision and stabilize her fracture, then have the wound not heal and her hardware to get infected. The husband states that he is quite comfortable with this decision at this time.  Thank you for ask me to participate in the care of this most unfortunate woman. Please reconsult orthopedics if there is anything else that she might need.   Maryagnes AmosJ. Jeffrey Poggi, MD  Beeper #:  [phone]  09/16/2015 6:40 PM

## 2015-09-16 NOTE — Progress Notes (Signed)
Late chart. Pt took 1/2 teaspoon of mashed potatoes. Pt refused to eat any more by shaking her head no and pulling away from spoon.

## 2015-09-16 NOTE — Progress Notes (Signed)
Subjective:  Pt resting comfortably. No significant change in status. Due for dialysis today at the bedside.  UF target is 0.5kg.     Objective:  Vital signs in last 24 hours:  Temp:  [98.7 F (37.1 C)-99.1 F (37.3 C)] 98.7 F (37.1 C) (10/28 0800) Pulse Rate:  [78-122] 113 (10/28 0900) Resp:  [13-33] 16 (10/28 0900) BP: (111-156)/(70-99) 140/81 mmHg (10/28 0900) SpO2:  [95 %-100 %] 100 % (10/28 0900) FiO2 (%):  [28 %] 28 % (10/28 0429) Weight:  [93 kg (205 lb 0.4 oz)] 93 kg (205 lb 0.4 oz) (10/28 0558)  Weight change: -2 kg (-4 lb 6.6 oz) Filed Weights   09/12/15 2100 09/15/15 0500 09/16/15 0558  Weight: 84.9 kg (187 lb 2.7 oz) 95 kg (209 lb 7 oz) 93 kg (205 lb 0.4 oz)    Intake/Output: I/O last 3 completed shifts: In: 2817.5 [I.V.:20; NG/GT:2797.5] Out: 1 [Stool:1]   Intake/Output this shift:  Total I/O In: 320 [I.V.:20; NG/GT:300] Out: -   Physical Exam: General: No acute distress  Head: [place]/AT eyes open NG in place  Eyes: anicteric  Neck: Trach in place  Lungs:  Scattered rhonchi, normal effort  Heart: S1S2 slight tachycardia  Abdomen:  Soft, nontender, BS present  Extremities: Trace LE edema  Neurologic: Awake, appears depressed  Access:  R IJ permcath.       Basic Metabolic Panel:  Recent Labs Lab 09/12/15 0450 09/14/15 0530 09/14/15 1933 09/16/15 0424  NA 138 139  --  138  K 3.9 3.9  --  3.8  CL 100* 100*  --  101  CO2 31 32  --  31  GLUCOSE 132* 154*  --  146*  BUN 72* 65*  --  62*  CREATININE 1.60* 1.41*  --  1.25*  CALCIUM 7.8* 7.6*  --  8.0*  PHOS 4.5  --  3.1  --     Liver Function Tests:  Recent Labs Lab 09/12/15 0450 09/16/15 0424  AST 16 19  ALT 10* 13*  ALKPHOS 968* 1008*  BILITOT 1.0 0.5  PROT 4.2* 4.4*  ALBUMIN 1.4* 1.5*   No results for input(s): LIPASE, AMYLASE in the last 168 hours. No results for input(s): AMMONIA in the last 168 hours.  CBC:  Recent Labs Lab 09/11/15 0522 09/12/15 0450 09/14/15 0530  09/16/15 0424  WBC 12.6* 13.4* 14.2* 18.1*  NEUTROABS  --   --   --  15.8*  HGB 7.4* 7.7* 7.8* 8.2*  HCT 24.3* 24.5* 25.2* 25.4*  MCV 96.2 96.4 98.3 101.5*  PLT 179 198 192 236    Cardiac Enzymes: No results for input(s): CKTOTAL, CKMB, CKMBINDEX, TROPONINI in the last 168 hours.  BNP: Invalid input(s): POCBNP  CBG:  Recent Labs Lab 09/15/15 0012 09/15/15 0747 09/15/15 1143 09/15/15 2329 09/16/15 0731  GLUCAP 130* 148* 149* 147* 124*    Microbiology: Results for orders placed or performed during the hospital encounter of 07/22/2015  Blood Culture (routine x 2)     Status: None   Collection Time: 07/28/2015  8:51 AM  Result Value Ref Range Status   Specimen Description BLOOD [name]  Final   Special Requests BOTTLES DRAWN AEROBIC AND ANAEROBIC  3CC  Final   Culture NO GROWTH 5 DAYS  Final   Report Status 08/17/2015 FINAL  Final  Blood Culture (routine x 2)     Status: None   Collection Time: 07/30/2015  9:20 AM  Result Value Ref Range Status   Specimen Description  BLOOD LEFT ARM  Final   Special Requests BOTTLES DRAWN AEROBIC AND ANAEROBIC  1CC  Final   Culture NO GROWTH 5 DAYS  Final   Report Status 08/17/2015 FINAL  Final  Wound culture     Status: None   Collection Time: 01-Sep-2015  9:20 AM  Result Value Ref Range Status   Specimen Description DECUBITIS  Final   Special Requests Normal  Final   Gram Stain   Final    FEW WBC SEEN MANY GRAM NEGATIVE RODS RARE GRAM POSITIVE COCCI    Culture   Final    HEAVY GROWTH ESCHERICHIA COLI MODERATE GROWTH ENTEROBACTER AEROGENES PROTEUS MIRABILIS HEAVY GROWTH ENTEROCOCCUS SPECIES VRE HAVE INTRINSIC RESISTANCE TO MOST COMMONLY USED ANTIBIOTICS AND THE ABILITY TO ACQUIRE RESISTANCE TO MOST AVAILABLE ANTIBIOTICS.    Report Status 08/16/2015 FINAL  Final   Organism ID, Bacteria ESCHERICHIA COLI  Final   Organism ID, Bacteria ENTEROBACTER AEROGENES  Final   Organism ID, Bacteria PROTEUS MIRABILIS  Final   Organism ID, Bacteria  ENTEROCOCCUS SPECIES  Final      Susceptibility   Enterobacter aerogenes - MIC*    CEFTAZIDIME <=1 SENSITIVE Sensitive     CEFAZOLIN >=64 RESISTANT Resistant     CEFTRIAXONE <=1 SENSITIVE Sensitive     CIPROFLOXACIN <=0.25 SENSITIVE Sensitive     GENTAMICIN <=1 SENSITIVE Sensitive     IMIPENEM 1 SENSITIVE Sensitive     TRIMETH/SULFA <=20 SENSITIVE Sensitive     * MODERATE GROWTH ENTEROBACTER AEROGENES   Escherichia coli - MIC*    AMPICILLIN <=2 SENSITIVE Sensitive     CEFTAZIDIME <=1 SENSITIVE Sensitive     CEFAZOLIN <=4 SENSITIVE Sensitive     CEFTRIAXONE <=1 SENSITIVE Sensitive     CIPROFLOXACIN <=0.25 SENSITIVE Sensitive     GENTAMICIN <=1 SENSITIVE Sensitive     IMIPENEM <=0.25 SENSITIVE Sensitive     TRIMETH/SULFA <=20 SENSITIVE Sensitive     * HEAVY GROWTH ESCHERICHIA COLI   Proteus mirabilis - MIC*    AMPICILLIN >=32 RESISTANT Resistant     CEFTAZIDIME <=1 SENSITIVE Sensitive     CEFAZOLIN 8 SENSITIVE Sensitive     CEFTRIAXONE <=1 SENSITIVE Sensitive     CIPROFLOXACIN <=0.25 SENSITIVE Sensitive     GENTAMICIN <=1 SENSITIVE Sensitive     IMIPENEM 1 SENSITIVE Sensitive     TRIMETH/SULFA <=20 SENSITIVE Sensitive     * PROTEUS MIRABILIS   Enterococcus species - MIC*    AMPICILLIN >=32 RESISTANT Resistant     VANCOMYCIN >=32 RESISTANT Resistant     GENTAMICIN SYNERGY SENSITIVE Sensitive     TETRACYCLINE Value in next row Resistant      RESISTANT>=16    * HEAVY GROWTH ENTEROCOCCUS SPECIES  MRSA PCR Screening     Status: None   Collection Time: 2015-09-01  2:38 PM  Result Value Ref Range Status   MRSA by PCR NEGATIVE NEGATIVE Final    Comment:        The GeneXpert MRSA Assay (FDA approved for NASAL specimens only), is one component of a comprehensive MRSA colonization surveillance program. It is not intended to diagnose MRSA infection nor to guide or monitor treatment for MRSA infections.   Blood culture (single)     Status: None   Collection Time: 2015-09-01  3:36  PM  Result Value Ref Range Status   Specimen Description BLOOD RIGHT ASSIST CONTROL  Final   Special Requests BOTTLES DRAWN AEROBIC AND ANAEROBIC  Final   Culture NO GROWTH 5 DAYS  Final   Report Status 08/17/2015 FINAL  Final  Wound culture     Status: None   Collection Time: 08/13/15 12:37 PM  Result Value Ref Range Status   Specimen Description WOUND  Final   Special Requests Normal  Final   Gram Stain   Final    FEW WBC SEEN TOO NUMEROUS TO COUNT GRAM NEGATIVE RODS FEW GRAM POSITIVE COCCI    Culture   Final    HEAVY GROWTH ESCHERICHIA COLI MODERATE GROWTH PROTEUS MIRABILIS LIGHT GROWTH KLEBSIELLA PNEUMONIAE MODERATE GROWTH ENTEROCOCCUS GALLINARUM CRITICAL RESULT CALLED TO, READ BACK BY AND VERIFIED WITH: [facility] BORBA AT 1042 08/16/15 DV    Report Status 08/17/2015 FINAL  Final   Organism ID, Bacteria ESCHERICHIA COLI  Final   Organism ID, Bacteria PROTEUS MIRABILIS  Final   Organism ID, Bacteria KLEBSIELLA PNEUMONIAE  Final   Organism ID, Bacteria ENTEROCOCCUS GALLINARUM  Final      Susceptibility   Escherichia coli - MIC*    AMPICILLIN >=32 RESISTANT Resistant     CEFTAZIDIME 4 RESISTANT Resistant     CEFAZOLIN >=64 RESISTANT Resistant     CEFTRIAXONE 16 RESISTANT Resistant     GENTAMICIN 2 SENSITIVE Sensitive     IMIPENEM >=16 RESISTANT Resistant     TRIMETH/SULFA <=20 SENSITIVE Sensitive     Extended ESBL POSITIVE Resistant     PIP/TAZO Value in next row Resistant      RESISTANT>=128    CIPROFLOXACIN Value in next row Sensitive      SENSITIVE<=0.25    * HEAVY GROWTH ESCHERICHIA COLI   Klebsiella pneumoniae - MIC*    AMPICILLIN Value in next row Resistant      SENSITIVE<=0.25    CEFTAZIDIME Value in next row Resistant      SENSITIVE<=0.25    CEFAZOLIN Value in next row Resistant      SENSITIVE<=0.25    CEFTRIAXONE Value in next row Resistant      SENSITIVE<=0.25    CIPROFLOXACIN Value in next row Resistant      SENSITIVE<=0.25    GENTAMICIN Value in next  row Sensitive      SENSITIVE<=0.25    IMIPENEM Value in next row Resistant      SENSITIVE<=0.25    TRIMETH/SULFA Value in next row Resistant      SENSITIVE<=0.25    PIP/TAZO Value in next row Resistant      RESISTANT>=128    * LIGHT GROWTH KLEBSIELLA PNEUMONIAE   Proteus mirabilis - MIC*    AMPICILLIN Value in next row Resistant      RESISTANT>=128    CEFTAZIDIME Value in next row Sensitive      RESISTANT>=128    CEFAZOLIN Value in next row Sensitive      RESISTANT>=128    CEFTRIAXONE Value in next row Sensitive      RESISTANT>=128    CIPROFLOXACIN Value in next row Sensitive      RESISTANT>=128    GENTAMICIN Value in next row Sensitive      RESISTANT>=128    IMIPENEM Value in next row Sensitive      RESISTANT>=128    TRIMETH/SULFA Value in next row Sensitive      RESISTANT>=128    PIP/TAZO Value in next row Sensitive      SENSITIVE<=4    * MODERATE GROWTH PROTEUS MIRABILIS   Enterococcus gallinarum - MIC*    AMPICILLIN Value in next row Resistant      SENSITIVE<=4    GENTAMICIN SYNERGY Value in next row Sensitive  SENSITIVE<=4    CIPROFLOXACIN Value in next row Resistant      RESISTANT>=8    TETRACYCLINE Value in next row Resistant      RESISTANT>=16    * MODERATE GROWTH ENTEROCOCCUS GALLINARUM  Wound culture     Status: None   Collection Time: 08/15/15  2:53 PM  Result Value Ref Range Status   Specimen Description WOUND  Final   Special Requests NONE  Final   Gram Stain FEW WBC SEEN NO ORGANISMS SEEN   Final   Culture NO GROWTH 3 DAYS  Final   Report Status 08/18/2015 FINAL  Final  C difficile quick scan w PCR reflex     Status: None   Collection Time: 08/17/15 11:34 AM  Result Value Ref Range Status   C Diff antigen NEGATIVE NEGATIVE Final   C Diff toxin NEGATIVE NEGATIVE Final   C Diff interpretation Negative for C. difficile  Final  Stool culture     Status: None   Collection Time: 09/03/15  3:51 PM  Result Value Ref Range Status   Specimen  Description STOOL  Final   Special Requests Immunocompromised  Final   Culture   Final    NO SALMONELLA OR SHIGELLA ISOLATED No Pathogenic E. coli detected NO CAMPYLOBACTER DETECTED    Report Status 09/07/2015 FINAL  Final  C difficile quick scan w PCR reflex     Status: None   Collection Time: 09/13/15 12:51 AM  Result Value Ref Range Status   C Diff antigen NEGATIVE NEGATIVE Final   C Diff toxin NEGATIVE NEGATIVE Final   C Diff interpretation Negative for C. difficile  Final    Coagulation Studies: No results for input(s): LABPROT, INR in the last 72 hours.  Urinalysis: No results for input(s): COLORURINE, LABSPEC, PHURINE, GLUCOSEU, HGBUR, BILIRUBINUR, KETONESUR, PROTEINUR, UROBILINOGEN, NITRITE, LEUKOCYTESUR in the last 72 hours.  Invalid input(s): APPERANCEUR    Imaging: Ct Abdomen Pelvis Wo Contrast  09/16/2015  CLINICAL DATA:  49 year old female with multiple loose bowel movements. Concern for diverticulitis or colitis. EXAM: CT ABDOMEN AND PELVIS WITHOUT CONTRAST TECHNIQUE: Multidetector CT imaging of the abdomen and pelvis was performed following the standard protocol without IV contrast. COMPARISON:  No priors. FINDINGS: Lower chest: Atherosclerotic calcifications left main, left anterior descending, left circumflex and right coronary arteries. Catheter tip terminating in the right atrium. Trace bilateral pleural effusions. Areas of consolidation and atelectasis in the lower lobes of the lungs bilaterally where there is some mild cylindrical bronchiectasis. Hepatobiliary: Diffuse capsular calcifications along the surface of the liver. No discrete cystic or solid hepatic lesions are confidently identified on today's noncontrast CT examination. Gallbladder is not confidently identified and may be surgically absent. Pancreas: No definite pancreatic mass or peripancreatic inflammatory changes. Spleen: Capsular calcifications along the surface of the spleen. Otherwise, unremarkable.  Adrenals/Urinary Tract: Bilateral perinephric stranding (nonspecific). The unenhanced appearance of the kidneys and bilateral adrenal glands is otherwise unremarkable. The unenhanced appearance of the urinary bladder is normal. Stomach/Bowel: Numerous calcifications are noted along the surface of the stomach. 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 Notably, the patient indwelling feeding tube extends through the stomach into the right side of the upper abdomen in a very superficial location immediately deep to the right lateral abdominal wall musculature. It is uncertain whether or not the tip of the tube is within the lumen of the bowel, or has extended beyond the lumen of the bowel (image 44  of series 2). The exact type of gastric bypass is uncertain on today's examination, as the anatomy is highly atypical. The third portion of the duodenum traverses beneath the superior mesenteric artery, excluding the possibility of small bowel malrotation as an etiology for this abnormal appearance. Vascular/Lymphatic: Extensive atherosclerotic calcifications throughout the abdominal and pelvic vasculature, without definite aneurysm. No lymphadenopathy confidently identified in the abdomen or pelvis on today's noncontrast CT examination. Reproductive: Uterus and ovaries are atrophic. Other: Extensive calcifications throughout the peritoneal cavity, presumably from prior episodes of peritonitis. Diffuse mesenteric edema. Trace volume of ascites. No pneumoperitoneum. Musculoskeletal: Diffuse severe body wall edema. Deep decubitus ulcers in the right gluteal and sacral region, which extend to the underlying bone. The sacrum and coccyx are not visualized beyond S3, either surgically resected or destroyed by osteomyelitis. There is also destruction of the disc space and portions of vertebral bodies at T8-T9, suggesting discitis/osteomyelitis. Severe diffuse  osteopenia, with multiple old healed bilateral rib fractures. In addition, there is a common and intertrochanteric fracture of the right hip with only minimal displacement. IMPRESSION: 1. Exceedingly limited study secondary to lack of IV contrast. No definite findings to suggest diverticulitis. Assessment of the colon is limited by lack of oral and IV contrast, but there are no overt inflammatory changes to strongly suggest a colitis at this time. 2. Patient's postoperative anatomy following gastric bypass is highly unusual and uncertain on today's examination. Patient does have an indwelling feeding tube, but the metallic tip of the feeding tube is in an unusual very superficial position in the right side of the abdomen, with an appearance whichcould suggest perforation beyond the lumen of the bowel. This could be better evaluated with followup injection of the feeding tube under fluoroscopic visualization. 3. Diffuse body wall and mesenteric edema with trace volume of ascites and trace bilateral pleural effusions, compatible with a underlying state of anasarca. 4. Severe calcifications throughout the peritoneal cavity, presumably secondary to prior episodes of peritonitis. 5. Severe osteopenia in the bones, with a mildly comminuted intertrochanteric fracture of the right hip. There also changes at T8-T9, suggestive of discitis/osteomyelitis. 6. Deep right gluteal and sacral decubitus ulcers extending to the underlying bone, with complete absence of the distal sacrum beyond S3 and the entire coccyx, which could be postoperative, or could be secondary to chronic osteomyelitis incomplete bony destruction. 7. Additional incidental findings, as above. Electronically Signed   By: Trudie Reed M.D.   On: 09/16/2015 01:26     Medications:   . feeding supplement (VITAL AF 1.2 CAL) 1,000 mL (09/16/15 0900)  . norepinephrine Stopped (09/10/15 0932)   . acidophilus  1 capsule Oral Daily  . albumin human  25 g  Intravenous QODAY  . amantadine  200 mg Oral Q7 days  . antiseptic oral rinse  7 mL Mouth Rinse q12n4p  . chlorhexidine  15 mL Mouth Rinse BID  . epoetin (EPOGEN/PROCRIT) injection  10,000 Units Intravenous Q M,W,F-HD  . feeding supplement (PRO-STAT SUGAR FREE 64)  30 mL Per Tube 6 times per day  . FLUoxetine  10 mg Oral Daily  . free water  200 mL Per Tube 3 times per day  . heparin subcutaneous  5,000 Units Subcutaneous Q12H  . hydrocortisone  10 mg Per Tube BID  . lidocaine  1 patch Transdermal Q24H  . midodrine  10 mg Oral TID WC  . multivitamin  5 mL Per Tube Daily  . nystatin   Topical TID  . pantoprazole sodium  40  mg Per Tube Q1200  . sodium chloride  10-40 mL Intracatheter Q12H  . sodium hypochlorite   Irrigation BID   sodium chloride, sodium chloride, sodium chloride, sodium chloride, acetaminophen (TYLENOL) oral liquid 160 mg/5 mL, HYDROmorphone (DILAUDID) injection, ipratropium-albuterol, loperamide, morphine injection, oxyCODONE-acetaminophen, sodium chloride  Assessment/ Plan:  49 y.o. 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 End-stage renal disease on hemodialysis on HD MWF. The patient has been on dialysis since October of 2014. R IJ permcath.  -  Pt due for HD today, orders prepared, UF target of 0.5kg, using low blood flow rates, sodium modeling, albumin, midodrine for BP support.   2. Anemia of  CKD: pt continues to require periodic blood transfusions, epo hasn't been as effective as hoped likely due to chronic inflammation from decubitus ulcer/infection etc. -hgb up to 8.2, continue epogen with HD.  3. Secondary hyperparathyroidism: PTH low at 55 - last phos was acceptable at 3.1, pt not on any binder therapy.  4. Hypotension: required vasopressors earlier in admission. -continue to employ sodium modeling, midodrine, albumin, low BFRs during HD.  5.  Dispo:  Care management actively following.   LOS: 35 Dayne Dekay 10/28/20169:52 AM

## 2015-09-16 NOTE — Progress Notes (Signed)
ANTIBIOTIC CONSULT NOTE - INITIAL  Pharmacy Consult for Meropenem/Daptomycin Indication: Osteomyelitis  Allergies  Allergen Reactions  . Contrast Media [Iodinated Diagnostic Agents] Anaphylaxis  . Ampicillin Rash    Patient Measurements: Height: 5\' 9"  (175.3 cm) Weight: 205 lb 0.4 oz (93 kg) IBW/kg (Calculated) : 66.2   Vital Signs: Temp: 98.7 F (37.1 C) (10/28 0800) Temp Source: Oral (10/28 0800) BP: 146/93 mmHg (10/28 1115) Pulse Rate: 103 (10/28 1115) Intake/Output from previous day: 10/27 0701 - 10/28 0700 In: 1717.5 [I.V.:20; NG/GT:1697.5] Out: 1 [Stool:1] Intake/Output from this shift: Total I/O In: 370 [I.V.:20; NG/GT:350] Out: -   Labs:  Recent Labs  09/14/15 0530 09/16/15 0424  WBC 14.2* 18.1*  HGB 7.8* 8.2*  PLT 192 236  CREATININE 1.41* 1.25*   Estimated Creatinine Clearance: 65.4 mL/min (by C-G formula based on Cr of 1.25). No results for input(s): VANCOTROUGH, VANCOPEAK, VANCORANDOM, GENTTROUGH, GENTPEAK, GENTRANDOM, TOBRATROUGH, TOBRAPEAK, TOBRARND, AMIKACINPEAK, AMIKACINTROU, AMIKACIN in the last 72 hours.   Microbiology: Recent Results (from the past 720 hour(s))  C difficile quick scan w PCR reflex     Status: None   Collection Time: 08/17/15 11:34 AM  Result Value Ref Range Status   C Diff antigen NEGATIVE NEGATIVE Final   C Diff toxin NEGATIVE NEGATIVE Final   C Diff interpretation Negative for C. difficile  Final  Stool culture     Status: None   Collection Time: 09/03/15  3:51 PM  Result Value Ref Range Status   Specimen Description STOOL  Final   Special Requests Immunocompromised  Final   Culture   Final    NO SALMONELLA OR SHIGELLA ISOLATED No Pathogenic E. coli detected NO CAMPYLOBACTER DETECTED    Report Status 09/07/2015 FINAL  Final  C difficile quick scan w PCR reflex     Status: None   Collection Time: 09/13/15 12:51 AM  Result Value Ref Range Status   C Diff antigen NEGATIVE NEGATIVE Final   C Diff toxin NEGATIVE  NEGATIVE Final   C Diff interpretation Negative for C. difficile  Final    Medical History: Past Medical History  Diagnosis Date  . Obesity   . Dyslipidemia   . Hypertension   . Coronary artery disease     s/p BMS 2010 LAD  . Dysrhythmia     ventricular tachycardia resolved after LAD stent and beta blocker  . Diabetes mellitus     with retinopathy, neuropathy and microalbuminemia  . ESRD (end stage renal disease) on dialysis     Medications:  Scheduled:  . acidophilus  1 capsule Oral Daily  . albumin human  25 g Intravenous QODAY  . antiseptic oral rinse  7 mL Mouth Rinse q12n4p  . chlorhexidine  15 mL Mouth Rinse BID  . DAPTOmycin (CUBICIN)  IV  500 mg Intravenous Q48H  . epoetin (EPOGEN/PROCRIT) injection  10,000 Units Intravenous Q M,W,F-HD  . feeding supplement (PRO-STAT SUGAR FREE 64)  30 mL Per Tube 6 times per day  . FLUoxetine  10 mg Oral Daily  . free water  200 mL Per Tube 3 times per day  . heparin subcutaneous  5,000 Units Subcutaneous Q12H  . hydrocortisone  10 mg Per Tube BID  . lidocaine  1 patch Transdermal Q24H  . meropenem (MERREM) IV  500 mg Intravenous Q24H  . midodrine  10 mg Oral TID WC  . multivitamin  5 mL Per Tube Daily  . nystatin   Topical TID  . pantoprazole sodium  40 mg  Per Tube Q1200  . sodium chloride  10-40 mL Intracatheter Q12H  . sodium hypochlorite   Irrigation BID   Infusions:  . feeding supplement (VITAL AF 1.2 CAL) 1,000 mL (09/16/15 1000)  . 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   Goal of Therapy:  Resolution of Infection  Plan:  Will resume daptomycin /kg q 48 hours and meropenem 500 mg iv q 24 hours as appropriate for HD dosing. Will continue to follow and adjust as necessary if renal replacement therapy changed.   Luisa Hart  D 09/16/2015,11:30 AM

## 2015-09-16 NOTE — Care Management (Signed)
Results of abdominal CT worrisome for: osteomyelitis/discitis vertebral bodies T8-9,  r hip fx, severe osteopenia, sacrum and coccyx absent beyond S3, either surgically resected or destroyed by osteomyelitis.    uncertain of exact location of the tip of the dobb hoff.  Unable to determine by imaging the type of gastric bypass surgery performed.  Patient's wbc has gradually climbed over the last few days- up to 18.1.  Will need to obtain fluoroscopy to determine location of the tip of the dobb hoff; IV antibiotics for osteomyelitis and elevated WBC, ortho consult for the hip fx. Need input to determine if the are any functional contraindications for therapy.   patient has been more somnolent today.  She took one half of a teaspoon of mash potatoes yesterday.  She is experiencing significant pain during her sacral decubitus wound change.  Dilaudid.5mg  ordered IV to be given  prior to the dressing change. PT and OT holding for this day.  Dialysis to be performed today.  Neurological functional assessment performed documents that  it is inconclusive whether she can participate in care decisions because her mental status can change throughout the day.Suggests sx could be related with chronic hospitalization and of steroid induced neuropathy/myopathy. States the later could be determined by nerve conduction studies/ electromyography . Will discuss whether it would be of any benefit now to have patient re evaluated by psychiatry for competency

## 2015-09-16 NOTE — Consult Note (Signed)
Patient well-known the surgery service. Please see prior surgery consultations that had been completed during this hospital stay. Reconsult again today for possible bowel perforation from feeding tube. Patient continues to not feel indicate with me so history from patient is not helpful. Patient continues to linger with her multiple medical problems from her multiple surgeries in her prolonged hospital stay last 2 years. She had a CT scan performed without any contrast for evaluation of possible colitis. At which showed a possible malplaced Dobbhoff tube. Fluoroscopic examination today seemed confirmed a walled off small bowel perforation. This feeding tube has been in place for over a month.  Physical exam: Gen.: Laying in bed and not responding to verbal commands. Chest: Coarse breath sounds bilateral with valve over tracheostomy site Heart: Tachycardic Abdomen: Soft, nondistended. No physical evidence on exam of bowel perforation  Images and labs. CT scan and fluoroscopic exam reviewed. CT scan without by mouth or IV contrast however significant for no free air or large volume fluid collection outside the intestine. Fluoroscopic exam interpreted by radiology as a contained perforation of the small bowel. Difficult to interpret by me as it shows contrast within the small bowel. Patient has multiple other findings that was found on the CT scan to include possible osteomyelitis and intertrochanteric hip fracture.  Assessment and plan. 49 year old female with multiple comorbidities and acute on chronic problems. Radiologically identified possible contained small bowel perforation. Due to the lack of free air or free flow of contrast and the peritoneum there is no indication for any surgical intervention on this. Would recommend pulling back the feeding tube 1-2 cm. Would recheck an abdominal film to confirm no pneumoperitoneum in the morning. Absence of any changes okay to continue using Dobbhoff for  feeding and medications. Should this patient require abdominal surgery it is still a recommendation of the surgical group that she be transferred back to [facility] where she's had numerous abdominal surgeries including her abdominal catastrophe that has left her in her current state. No plan for surgical intervention at this time. We'll follow along until after repeat x-ray in the morning. If no evidence of pneumoperitoneum will sign off at that time.  Ricarda Frameharles Braeleigh Pyper, MD FACS General Surgeon [facility]

## 2015-09-16 NOTE — Progress Notes (Signed)
Call to eMD on [facility] pager from Dr Lenoria Farrier Hoss who paged for Dr Dema Severin but oncall now is eMD  revie of [facility] with contrast injection  1 perforation of bowel by doboff 2. Appears walled off 3. Fluid collection possible  His Rec 1. Strict npo 2. ? Needs [facility]CS consult v remove /withdraw doboff 3. Need oral contrast CT  D.w bedside on call CCM MD - Dr Sung Amabile  - he recommended ccs consult -order written     Dr. Kalman Shan, M.D., [facility].C.P Pulmonary and Critical Care Medicine Staff Physician [facility] [place] [facility] Pager: [phone], If no answer or between  15:00h - 7:00h: call [phone]  09/16/2015 4:03 PM   Ct Abdomen Pelvis Wo Contrast  09/16/2015  CLINICAL DATA:  49 year old female with multiple loose bowel movements. Concern for diverticulitis or colitis. EXAM: CT ABDOMEN AND PELVIS WITHOUT CONTRAST TECHNIQUE: Multidetector CT imaging of the abdomen and pelvis was performed following the standard protocol without IV contrast. COMPARISON:  No priors. FINDINGS: Lower chest: Atherosclerotic calcifications left main, left anterior descending, left circumflex and right coronary arteries. Catheter tip terminating in the right atrium. Trace bilateral pleural effusions. Areas of consolidation and atelectasis in the lower lobes of the lungs bilaterally where there is some mild cylindrical bronchiectasis. Hepatobiliary: Diffuse capsular calcifications along the surface of the liver. No discrete cystic or solid hepatic lesions are confidently identified on today's noncontrast CT examination. Gallbladder is not confidently identified and may be surgically absent. Pancreas: No definite pancreatic mass or peripancreatic inflammatory changes. Spleen: Capsular calcifications along the surface of the spleen. Otherwise, unremarkable. Adrenals/Urinary Tract: Bilateral perinephric stranding (nonspecific). The unenhanced appearance of the kidneys and bilateral adrenal  glands is otherwise unremarkable. The unenhanced appearance of the urinary bladder is normal. Stomach/Bowel: Numerous calcifications are noted along the surface of the stomach. 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 Notably, the patient indwelling feeding tube extends through the stomach into the right side of the upper abdomen in a very superficial location immediately deep to the right lateral abdominal wall musculature. It is uncertain whether or not the tip of the tube is within the lumen of the bowel, or has extended beyond the lumen of the bowel (image 44 of series 2). The exact type of gastric bypass is uncertain on today's examination, as the anatomy is highly atypical. The third portion of the duodenum traverses beneath the superior mesenteric artery, excluding the possibility of small bowel malrotation as an etiology for this abnormal appearance. Vascular/Lymphatic: Extensive atherosclerotic calcifications throughout the abdominal and pelvic vasculature, without definite aneurysm. No lymphadenopathy confidently identified in the abdomen or pelvis on today's noncontrast CT examination. Reproductive: Uterus and ovaries are atrophic. Other: Extensive calcifications throughout the peritoneal cavity, presumably from prior episodes of peritonitis. Diffuse mesenteric edema. Trace volume of ascites. No pneumoperitoneum. Musculoskeletal: Diffuse severe body wall edema. Deep decubitus ulcers in the right gluteal and sacral region, which extend to the underlying bone. The sacrum and coccyx are not visualized beyond S3, either surgically resected or destroyed by osteomyelitis. There is also destruction of the disc space and portions of vertebral bodies at T8-T9, suggesting discitis/osteomyelitis. Severe diffuse osteopenia, with multiple old healed bilateral rib fractures. In addition, there is a common and intertrochanteric fracture of the right hip  with only minimal displacement. IMPRESSION: 1. Exceedingly limited study secondary to lack of IV contrast. No definite findings to suggest  diverticulitis. Assessment of the colon is limited by lack of oral and IV contrast, but there are no overt inflammatory changes to strongly suggest a colitis at this time. 2. Patient's postoperative anatomy following gastric bypass is highly unusual and uncertain on today's examination. Patient does have an indwelling feeding tube, but the metallic tip of the feeding tube is in an unusual very superficial position in the right side of the abdomen, with an appearance whichcould suggest perforation beyond the lumen of the bowel. This could be better evaluated with followup injection of the feeding tube under fluoroscopic visualization. 3. Diffuse body wall and mesenteric edema with trace volume of ascites and trace bilateral pleural effusions, compatible with a underlying state of anasarca. 4. Severe calcifications throughout the peritoneal cavity, presumably secondary to prior episodes of peritonitis. 5. Severe osteopenia in the bones, with a mildly comminuted intertrochanteric fracture of the right hip. There also changes at T8-T9, suggestive of discitis/osteomyelitis. 6. Deep right gluteal and sacral decubitus ulcers extending to the underlying bone, with complete absence of the distal sacrum beyond S3 and the entire coccyx, which could be postoperative, or could be secondary to chronic osteomyelitis incomplete bony destruction. 7. Additional incidental findings, as above. Electronically Signed   By: Trudie Reedaniel  Entrikin M.D.   On: 09/16/2015 01:26   Dg Abd 1 View  09/16/2015  CLINICAL DATA:  Cough opt tube evaluation EXAM: ABDOMEN - 1 VIEW COMPARISON:  CT last night FINDINGS: Contrast has been injected into the feeding tube. And ill-defined cavity is seen filled with contrast surrounding the tip of the feeding tube. Prior to this cavity, the feeding tube traverses duodenum.  Duodenal loops filled with contrast likely due to retrograde passage of contrast from the cavity, along the tract, through the perforation and into duodenal loops. There is no extravasation of contrast into the peritoneal space. IMPRESSION: The study confirms that the feeding tube pes perforated through the duodenum and the tip is within a cavity that fills with injected contrast. The cavity does appear walled off. Electronically Signed   By: Jolaine ClickArthur  Hoss M.D.   On: 09/16/2015 16:05   Dg Chest Port 1 View  09/16/2015  CLINICAL DATA:  Respiratory failure and increasing white blood cell count. EXAM: PORTABLE CHEST 1 VIEW COMPARISON:  09/12/2015 FINDINGS: Right jugular dialysis catheter tip at the cavoatrial junction. There is a left jugular central line that terminates in the upper SVC region. Feeding tube extends into the abdomen but the tip is beyond the image. There is a tracheostomy tube. Again noted are slightly low lung volumes without focal airspace disease. No pulmonary edema. Heart size is within normal limits. Negative for a pneumothorax. IMPRESSION: No acute chest findings. Support apparatuses as described. Electronically Signed   By: Richarda OverlieAdam  Henn M.D.   On: 09/16/2015 15:23

## 2015-09-16 NOTE — Progress Notes (Signed)
OT Cancellation Note  Patient Details Name: Courtney [name] MRN: [id number] DOB: 02-Apr-1966   Cancelled Treatment:    Reason Eval/Treat Not Completed: Patient at procedure or test/ unavailable.  Patient receiving dialysis.  [name], Roshana Shuffield M 09/16/2015, 10:58 AM

## 2015-09-16 NOTE — Progress Notes (Signed)
Spoke to e-link Dr. Marchelle Gearingamaswamy in regards to surgery's follow-up on dobhoff. He stated to pull back dobhoff 2 cm and continue to give po meds but hold prostat. Stated to hold tube feeding for now and reevaluate in am. Dobhoff is now at 65cm.

## 2015-09-16 NOTE — Progress Notes (Signed)
Speech Therapy Note: reviewed chart notes and CT scan report; MD notes post rounds. Pt receiving dialysis at this time. MD stated to continue both oral intake and TFs as planned at this time. Pt has taken only tsp amounts w/ NSG. Pt is wearing the PMV when taking po's w/ NSG; little to no verbal communication reported by NSG. ST will continue to f/u w/ pt's toleration of diet; PMV toleration for communication. Discussed w/ NSG and CM.

## 2015-09-16 NOTE — Progress Notes (Signed)
At the bedside with Dr. Joice LoftsPoggi, Pt has speech valve in place. Asked her if she was in any pain. She nodded yes. I asked if she could tell me where she was hurting. She nodded yes. I asked if she would use her words to talk to me and tell me where she hurts. She stated "I hurt all over".

## 2015-09-16 NOTE — Progress Notes (Signed)
Nutrition Follow-up  DOCUMENTATION CODES:   Severe malnutrition in context of acute illness/injury  INTERVENTION:   Coordination of Care: discussed nutritional poc during ICU rounds; MD Mungal present and reviewed CT abdomen/pelvis. Discussed plan with regards to oral intake and EN; MD wanting to continue both oral intake and tube feeding at present. Will continue to follow.    NUTRITION DIAGNOSIS:   Inadequate oral intake related to wound healing, acute illness, chronic illness as evidenced by NPO status, estimated needs.  GOAL:   Patient will meet greater than or equal to 90% of their needs  MONITOR:    (Energy Intake, Anthropometrics, Digestive System, Electrolyte/Renal Profile, Glucose Profile)  REASON FOR ASSESSMENT:   Consult Enteral/tube feeding initiation and management  ASSESSMENT:   Pt to receive HD again today, UF goal of 0.5 kg. CT abdomen/pevlis peformed yesterday and report reviewed. Noted neurology consult note as well from yesterday. Pt not as alert this AM per report.   Diet Order:  DIET - DYS 1 Room service appropriate?: No; Fluid consistency:: Nectar Thick   Energy Intake: pt did not eat breakfast this AM; reviewed nsg notes with regards to attempted po intake. Pt only took very small bites yesterday. Pt shaking head "no" at meal times.   Skin:   (stage IV sacrum, stage III hip, unstageable foot; rectal ulcer from fleixseal, ulcer in nose from dobhoff)  Digestive System: reviewed CT abdomen report, stool continues  Electrolyte and Renal Profile:  Recent Labs Lab 09/12/15 0450 09/14/15 0530 09/14/15 1933 09/16/15 0424  BUN 72* 65*  --  62*  CREATININE 1.60* 1.41*  --  1.25*  NA 138 139  --  138  K 3.9 3.9  --  3.8  PHOS 4.5  --  3.1  --    Glucose Profile:  Recent Labs  09/15/15 1143 09/15/15 2329 09/16/15 0731  GLUCAP 149* 147* 124*   Meds: aciodphilus, midodrine, MVI, imodium prn  Height:   Ht Readings from Last 1 Encounters:   10/22/15 5\' 9"  (1.753 m)    Weight:   Wt Readings from Last 1 Encounters:  09/16/15 205 lb 0.4 oz (93 kg)     Filed Weights   09/15/15 0500 09/16/15 0558 09/16/15 1115  Weight: 209 lb 7 oz (95 kg) 205 lb 0.4 oz (93 kg) 202 lb 13.2 oz (92 kg)    BMI:  Body mass index is 30.26 kg/(m^2).  Estimated Nutritional Needs:   Kcal:  2175-2535 kcals (BEE 1509, 1.2 AF, 1.2-1.4 IF) or 2490-2905 kcals (30-35 kcals/kg)   Protein:  125-166 g (1.5-2.0 g/kg) but likely closer to 166-208 g (2.0-2.5 g/kg)   Fluid:  1000 mL plus UOP  EDUCATION NEEDS:   No education needs identified at this time  HIGH Care Level  Romelle Starcherate Kleber Crean MS, RD, LDN [phone] Pager

## 2015-09-16 NOTE — Progress Notes (Signed)
PT Cancellation Note  Patient Details Name: Courtney [name] MRN: [id number] DOB: 11-27-1965   Cancelled Treatment:    Reason Eval/Treat Not Completed: Medical issues which prohibited therapy (Per chart review, patient noted with newly-diagnosed R hip introchanteric fracture, minimal displacement.  Also noted with significant destruction of sacrum/coccyx and possible osteomyelitis T8-9.  Discussed with rounding physician who did not wish to discontinue PT at this time, but was agreeable to ortho consult to address orthopedic recommendations/restrictions.  Will hold at this time and continue as mobility restrictions clarified with ortho and additional rounding physicians.)   Gaynor Ferreras H. Manson PasseyBrown, PT, DPT, NCS 09/16/2015, 12:54 PM [phone]

## 2015-09-17 ENCOUNTER — Inpatient Hospital Stay: Payer: 59

## 2015-09-17 LAB — COMPREHENSIVE METABOLIC PANEL
ALT: 20 U/L (ref 14–54)
AST: 33 U/L (ref 15–41)
Albumin: 1.8 g/dL — ABNORMAL LOW (ref 3.5–5.0)
Alkaline Phosphatase: 1486 U/L — ABNORMAL HIGH (ref 38–126)
Anion gap: 6 (ref 5–15)
BUN: 44 mg/dL — AB (ref 6–20)
CHLORIDE: 102 mmol/L (ref 101–111)
CO2: 33 mmol/L — ABNORMAL HIGH (ref 22–32)
CREATININE: 1.01 mg/dL — AB (ref 0.44–1.00)
Calcium: 7.9 mg/dL — ABNORMAL LOW (ref 8.9–10.3)
GFR calc non Af Amer: >60 mL/min (ref >60)
Glucose, Bld: 67 mg/dL (ref 65–99)
Potassium: 3.6 mmol/L (ref 3.5–5.1)
SODIUM: 141 mmol/L (ref 135–145)
TOTAL PROTEIN: 4.2 g/dL — AB (ref 6.5–8.1)
Total Bilirubin: 0.8 mg/dL (ref 0.3–1.2)

## 2015-09-17 LAB — CBC WITH DIFFERENTIAL/PLATELET
BASOS ABS: 0 10*3/uL (ref 0–0.1)
BASOS PCT: 0 %
EOS ABS: 0 10*3/uL (ref 0–0.7)
EOS PCT: 0 %
HCT: 24 % — ABNORMAL LOW (ref 35.0–47.0)
HEMOGLOBIN: 7.5 g/dL — AB (ref 12.0–16.0)
LYMPHS ABS: 1 10*3/uL (ref 1.0–3.6)
Lymphocytes Relative: 8 %
MCH: 31.7 pg (ref 26.0–34.0)
MCHC: 31.1 g/dL — ABNORMAL LOW (ref 32.0–36.0)
MCV: 101.8 fL — ABNORMAL HIGH (ref 80.0–100.0)
Monocytes Absolute: 1.2 10*3/uL — ABNORMAL HIGH (ref 0.2–0.9)
Monocytes Relative: 10 %
NEUTROS PCT: 82 %
Neutro Abs: 9.7 10*3/uL — ABNORMAL HIGH (ref 1.4–6.5)
PLATELETS: 194 10*3/uL (ref 150–440)
RBC: 2.36 MIL/uL — AB (ref 3.80–5.20)
RDW: 23.7 % — ABNORMAL HIGH (ref 11.5–14.5)
WBC: 11.9 10*3/uL — AB (ref 3.6–11.0)

## 2015-09-17 LAB — GLUCOSE, CAPILLARY
GLUCOSE-CAPILLARY: 64 mg/dL — AB (ref 65–99)
GLUCOSE-CAPILLARY: 54 mg/dL — AB (ref 65–99)
GLUCOSE-CAPILLARY: 89 mg/dL (ref 65–99)
GLUCOSE-CAPILLARY: 61 mg/dL — AB (ref 65–99)
GLUCOSE-CAPILLARY: 62 mg/dL — AB (ref 65–99)
GLUCOSE-CAPILLARY: 64 mg/dL — AB (ref 65–99)
Glucose-Capillary: 99 mg/dL (ref 65–99)
Glucose-Capillary: 72 mg/dL (ref 65–99)
Glucose-Capillary: 68 mg/dL (ref 65–99)

## 2015-09-17 MED ORDER — DEXTROSE 50 % IV SOLN
25.0000 mL | Freq: Once | INTRAVENOUS | Status: AC
  Administered 2015-09-18: 25 mL via INTRAVENOUS
  Filled 2015-09-17: qty 50

## 2015-09-17 MED ORDER — DEXTROSE 10 % IV SOLN
INTRAVENOUS | Status: DC
  Administered 2015-09-17: 15:00:00 via INTRAVENOUS

## 2015-09-17 MED ORDER — DEXTROSE 50 % IV SOLN
25.0000 mL | Freq: Once | INTRAVENOUS | Status: AC
Start: 2015-09-17 — End: 2015-09-17
  Administered 2015-09-17: 25 mL via INTRAVENOUS
  Filled 2015-09-17: qty 50

## 2015-09-17 MED ORDER — OXYCODONE HCL 5 MG/5ML PO SOLN
5.0000 mg | Freq: Four times a day (QID) | ORAL | Status: DC | PRN
  Administered 2015-09-18 – 2015-10-05 (×16): 5 mg via ORAL
  Filled 2015-09-17 (×17): qty 5

## 2015-09-17 MED ORDER — PANTOPRAZOLE SODIUM 40 MG IV SOLR
40.0000 mg | INTRAVENOUS | Status: DC
  Administered 2015-09-17: 40 mg via INTRAVENOUS
  Filled 2015-09-17: qty 40

## 2015-09-17 MED ORDER — DEXTROSE 50 % IV SOLN
INTRAVENOUS | Status: AC
  Administered 2015-09-17: 25 mL via INTRAVENOUS
  Filled 2015-09-17: qty 50

## 2015-09-17 MED ORDER — DEXTROSE 50 % IV SOLN
25.0000 mL | Freq: Once | INTRAVENOUS | Status: AC
  Administered 2015-09-17: 25 mL via INTRAVENOUS

## 2015-09-17 MED ORDER — DEXTROSE 50 % IV SOLN
25.0000 mL | Freq: Once | INTRAVENOUS | Status: AC
  Administered 2015-09-17: 25 mL via INTRAVENOUS
  Filled 2015-09-17: qty 50

## 2015-09-17 NOTE — Progress Notes (Signed)
[facility] Prog[facility] Note  Name: Courtney [name] MRN: [id number] DOB: Oct 06, 1966    ADMISSION DATE:  08/11/2015   INTERVAL HISTORY: 10/29 KUB: No fre air 10/28 Ortho consultation: I do not feel that she is a surgical candidate. Therefore, I feel that it would best to manage this fracture nonsurgically and allow it to heal by itself over time, which it should.  10/28 Gen Surg consultation: Due to the lack of free air or free flow of contrast and the peritoneum there is no indication for any surgical intervention on this. Would recommend pulling back the feeding tube 1-2 cm. Would recheck an abdominal film to confirm no pneumoperitoneum in the morning. Absence of any changes okay to continue using Dobbhoff for feeding and medications. 10/28 gastrograffin study: The study confirms that the feeding tube pes perforated through the duodenum and the tip is within a cavity that fills with injected contrast. The cavity does appear walled off 10/28-patient with mildly depressed mentation this morning, seems a little bit more somnolent than usual. We'll move on an open eyes but not as interactive. Take half a teaspoon of liquids yesterday by mouth with Passy-Muir valve in place. Overnight increasing white cell count 18,000, CT abdomen and pelvis done showed a right hip fracture small, unable to properly locate the metallic tip of the gastric tube, recommended fluoroscopic study. CT also showed osteomyelitis. Plan today is to resume daptomycin and meropenem, stop amantadine due to elevated alkaline phosphatase, orthopedic evaluation for mild/small right hip fracture.  10/27-showing large bowel movement last night, yellow in nature. Today team and decided to get a CT abdomen and pelvis without contrast to evaluate for diverticulitis/colitis/sigmoiditis and overall anatomy of her bowels. Change tube feeds from Jevity to Vita.  Patient tolerating speaking valve, we'll attempt to feed  with the use of a speaking valve while still having them off in place. Tube feedings to stop 10 minutes before attempting by mouth feeds. 0.$RemoveBefore'5mg'[id number]$  Dilaudid PRN with sacral dressing changes only.    10/26- had 3 loose stools overnight, still off vasopressors. Blood pressure stable overnight, no fever, white cell count mildly elevated at 14.2, small ulcer on lateral aspect of right foot and right ankle. Increase tube feeds to 50 mL. HD today  10/25- off vasopressors, prozac started yesterday, BP stable overnight, abx stopped yesterday, seen by PT and speech tx yesterday, mild inc in wbc, recheck labs in the AM. Two loose stools yesterday, cdiff neg.  10/24-off vasopressors, started prozac today, HD this PM and assess BP and vasopressors needs Follow CBC, repeat CXR pending, plan for Speech for small oral feeds, increased TF rate to 40 (goal is 60), abx stopped for now,    10/23- off vasopressors, 1 soft BM overnight, low H/H this AM, husband at bedside.   10/22 - off vasopressors, tolerated slow HD yesterday, tracking with eyes, and will nod head to answer questions. Sacral decub debrided at bedside yesterday. Restarted on levophed 61mcg during dialysis, stopped this AM.   10/21-off vasopressors s/p wound debridement at bedside by gen surgery Morphine given for severe pain, plan for slow HD today and assess BP Continue step down monitoring, plan for TEE next week will discuss with Husband  10/20-transferred to step down for sepsis/aspiration pneumonia, weaned off levophed this AM -abx adjusted, continue TF's, will NOT start oral feeds Plan for TEE at some point, appreciate surgical input, obtain CBC,chem 7, CXR as needed Will update husband today  10/19-patient with minimal amounts of  food this AM, will plan to continue oral intake along with Tube feedings and assess calorie count This was discussed with Husband, plan to stop TF's tomorrow if she tolerates oral feeds and remove NG tube in next 2  days Will talk and discuss with Husband that patient is very high risk for aspiration-will obtain Surgery consult to reassess decub ulcer Will consult cardiology to assess for TEE  10/18 discussed with husband-patient passed swallow eval spoke with Speech Therapist Will add Pureed thick nectar foods and continue Enteral feeds through NG tube.  Will add Megace   On 10/16  the patient's husband was upset due to placement of a rectal tube by on-call physician, this was removed. It was also noted that the pt developed diarrhea, therefore Jevity was changed from 1.5 with fiber to Jevity 1.2; the rate was decreased to 20 cc/hr and the pt was given a dose of lomotil, which has helped the diarrhea. It was also sent for stool culture and Cdif.   On 10/17 discussed with husband options going forward. He would like to transfer to [facility] as it is closer to him, also  [facility] is there so he can have his wife evaluated better and that he can have the [facility] come and check on her status personally-Dr. Mortimer Fries has discussed case with Hospitalist and they pretty much have refused to accept her at this time.   -He declines transfer of his wife back to [place].   -He would like to repeat speech evaluation and Pasey-Muir valve as he hopes that his wife will be able to eat and then will not need a feeding tube. He understands that she may be "stuck" in the hospital as currently no local LTAC is accepting her. He is continuing discussions with [facility].   -He would like his wife restarted on aspirin due to stroke risk. It was explained that given her life threatening bleed the rounding physician did not feel comfortable restarting this medication at this time, but he could revisit this discussion with other physicians in the future.    INITIAL PRESENTATION:  3 F who has been in medical facilities (hosp, LTAC, rehab) for 2 yrs following gastric bypass surgery with multiple complications. Now with chronic trach, ESRD,  profound debilitation, severe sacral pressure ulcer. Was seemingly making progress and transferred to rehab facility approx one week prior to this admission. She was sent to [facility] ED with AMS and hypotension. Working dx of severe sepsis/septic shock due to infected sacral pressure ulcer. Also has R side externalized HD cath and tunneled L IJ CVL. She has history of resistant bacterial infections. She is on chronic steroids. Her husband indicates that she has been diagnosed with adrenal insuff at some point during the past 2 yrs  MAJOR EVENTS/TEST RESULTS: Admission 02/07/14-05/07/14 Admission 07/21/14-09/06/14 Discharged to [facility]. Pt had palliative consult at that time, were asked to sign off by husband.  09/23 CT head: NAD 09/23 EEG: no epileptiform activity 09/23 PRBCs for Hgb 6.4 09/24 bedside debridement of sacral wound. Abscess drained 09/25 Off vasopressors. More alert. No distress. Worsening thrombocytopenia. Vanc DC'd 09/29  Dr. Ola Spurr (I.D) excused from the case by patient's husband. 10/02 MRI -multiple infarcts 10/03 tracheal bleeding- transfused platelets 10/03 hospitalist service excused from the case by patient's husband 10/03 Echocardiogram ejection fraction was 55-60%, pulmonary systolic pressure was 39 mmHg 10/08 restart TF's at lower rate, attempt reg HD  10/12 Transferred to med-surg floor. Remains on PCCM service 10/14 SLP eval: pt unable  to tolerate PMV adequately 10/17-will re-attempt PM valve-discussed with Speech therapist 10/18 passed swallow eval-start pureed thick foods no thin liquids-continue NG feeds 10/19 transferred to step down for sepsis/aspiration pneumonia 10/19 cxr shows RLL opacity 10/21 sacral decub debride at bedside by surgery 10/22 started back on vasopressors while on HD 10/27 CT with osteo, R hip fx, unable to identify tip of dubhoff tube - sent for fluoro study  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 Pneumoniae, P. Mirabilis, VRE Wound 9/26 >> No growth Blood 9/23 >> NEG CDiff 9/27>>neg Stool Cx 10/15>> negative Cdiff 10/25>>neg Trach Aspirate 10/28>> light growth GNR Blood 10/28 >> 1/2 GPC >>   ANTIMICROBIALS:  Aztreonam 9/23 >> 9/24 Vanc 9/23 >> 9/25 Vanc 9/26>>9/27 Daptomycin 9/27>> 10/12, 10/28>>  Meropenem 9/23 >> 10/14, 10/28 >>    SUBJECTIVE:  RASS -2. No distress  VITAL SIGNS: Temp:  [97.6 F (36.4 C)-98.9 F (37.2 C)] 98.9 F (37.2 C) (10/29 1700) Pulse Rate:  [103-114] 114 (10/29 1800) Resp:  [0-26] 12 (10/29 1800) BP: (105-133)/(39-102) 127/68 mmHg (10/29 1800) SpO2:  [99 %-100 %] 100 % (10/29 1800) FiO2 (%):  [28 %] 28 % (10/29 0854) Weight:  [93 kg (205 lb 0.4 oz)] 93 kg (205 lb 0.4 oz) (10/29 0500) HEMODYNAMICS:   VENTILATOR SETTINGS: Vent Mode:  [-]  FiO2 (%):  [28 %] 28 % INTAKE / OUTPUT:  Intake/Output Summary (Last 24 hours) at 09/17/15 1915 Last data filed at 09/17/15 1700  Gross per 24 hour  Intake 103.33 ml  Output      1 ml  Net 102.33 ml    PHYSICAL EXAMINATION: Physical Examination:   VS: BP 127/68 mmHg  Pulse 114  Temp(Src) 98.9 F (37.2 C) (Oral)  Resp 12  Ht 5\' 9"  (1.753 m)  Wt 93 kg (205 lb 0.4 oz)  BMI 30.26 kg/m2  SpO2 100%  LMP  (LMP Unknown)  General Appearance: No resp distress, coarse upper airway sounds  Neuro: profoundly diffusely weak HEENT: cushingoid facies, PERRLA, EOM intact Neck: trach site clean Pulmonary: clear anteriorly Cardiovascular: reg, + syst murmur Abdomen: soft, NT, +BS Extremities: warm, R>L UE edema    LABORATORY PANEL:   CBC  Recent Labs Lab 09/16/15 0530    WBC 11.9*  HGB 7.5*  HCT 24.0*  PLT 194    Chemistries   Recent Labs Lab 09/14/15 1933  09/16/15 0530  NA  --   < > 141  K  --   < > 3.6  CL  --   < > 102  CO2  --   < > 33*  GLUCOSE  --   < > 67  BUN  --   < > 44*  CREATININE  --   < > 1.01*  CALCIUM  --   < > 7.9*  PHOS 3.1  --   --   AST  --   < > 33  ALT  --   < > 20  ALKPHOS  --   < > 1486*  BILITOT  --   < > 0.8  < > = values in this interval not displayed.   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 Not requiring insulin coverage Chronic steroid therapy P:  Monitor CBGs q 8 hrs - resume SSI if > 180 Cont hydrocortisone @ 10 mg BID  MSK A: Right hip fx - small intertrochanteric R fx - pain management and ortho eval.    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

## 2015-09-17 NOTE — Progress Notes (Signed)
Physical Therapy Treatment Patient Details Name: Courtney [name] MRN: [id number] DOB: 10/11/1966 Today's Date: 09/17/2015    History of Present Illness Pt is a 55 F who has been in medical facilities (hosp, LTAC, rehab) for 2 yrs following gastric bypass surgery with multiple complications. Now with chronic trach, ESRD, profound debilitation, severe sacral pressure ulcer (stage IV). Was seemingly making progress and transferred to rehab facility approx one week prior to this admission. She was sent to [facility] ED 9/23 with AMS and hypotension. Admitted with sepsis related to infected sacral ulcer.  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  Patient transferred to CCU to med-surg floor during hospitalization, but experienced profound interdialytic hypotension requiring return transfer to CCU for use of pressors (now off).  New orders received to resume PT; cleared by RN for participation as tolerated.    PT Comments    Pt continues to be very weak and limited.  Orthopedic MD states that the R hip is non-surgical and plan regarding it will be conservative.  She is able to show some AAROM strength with b/l hip Adduction, otherwise LE exercises were PROM only.  Pt indicating pain with >15 degrees of hip flexion and Abd.  Limited session, pt needing to get further abdominal imaging.    Follow Up Recommendations  LTACH;SNF     Equipment Recommendations       Recommendations for Other Services       Precautions / Restrictions Precautions Precautions: Fall Restrictions Weight Bearing Restrictions: No    Mobility  Bed Mobility               General bed mobility comments: deferred sitting/mobility today pt indicating too much pain with even basic light bed exercsies  Transfers                    Ambulation/Gait                 Stairs            Wheelchair Mobility    Modified Rankin (Stroke Patients Only)       Balance                                    Cognition Arousal/Alertness: Awake/alert Behavior During Therapy: Flat affect Overall Cognitive Status: Difficult to assess                      Exercises Total Joint Exercises Hip ABduction/ADduction:  (pt is able to do ) General Exercises - Lower Extremity Ankle Circles/Pumps: PROM;10 reps;Both Heel Slides: PROM;10 reps;Both Hip ABduction/ADduction: PROM;AAROM;10 reps;Both (pt is able to shows some hip adduction initiation bilat)    General Comments        Pertinent Vitals/Pain Pain Location: pt indicates pain with essentially all movement, especially with light R hip movements    Home Living                      Prior Function            PT Goals (current goals can now be found in the care plan section) Progress towards PT goals: Progressing toward goals    Frequency  Min 2X/week    PT Plan Current plan remains appropriate  Co-evaluation             End of Session   Activity Tolerance: Patient limited by pain Patient left: in bed;with family/visitor present     Time: 1215-1230 PT Time Calculation (min) (ACUTE ONLY): 15 min  Charges:  $Therapeutic Exercise: 8-22 mins                    G Codes:     Courtney SentersGalen Faryal [name], PT, DPT [phone]  Courtney ProGalen R Shmiel [name] 09/17/2015, 1:38 PM

## 2015-09-17 NOTE — Progress Notes (Signed)
Subjective:  Patient awake this morning. Hemodialysis yesterday, tolerated well. No ultrafiltration.  No family at bedside this morning.  Holding tube feeds.     Objective:  Vital signs in last 24 hours:  Temp:  [97.6 F (36.4 C)-98.4 F (36.9 C)] 98.4 F (36.9 C) (10/29 0737) Pulse Rate:  [102-120] 112 (10/28 2300) Resp:  [0-29] 13 (10/29 0700) BP: (95-150)/(39-93) 126/62 mmHg (10/29 0700) SpO2:  [99 %-100 %] 100 % (10/29 0700) FiO2 (%):  [28 %] 28 % (10/29 0700) Weight:  [92 kg (202 lb 13.2 oz)-93 kg (205 lb 0.4 oz)] 93 kg (205 lb 0.4 oz) (10/29 0500)  Weight change: -1 kg (-2 lb 3.3 oz) Filed Weights   09/16/15 1115 09/16/15 1445 09/17/15 0500  Weight: 92 kg (202 lb 13.2 oz) 93 kg (205 lb 0.4 oz) 93 kg (205 lb 0.4 oz)    Intake/Output: I/O last 3 completed shifts: In: 1320 [I.V.:20; NG/GT:1150; IV Piggyback:150] Out: -93    Intake/Output this shift:     Physical Exam: General: No acute distress  Head: [place]/AT eyes open NG in place  Eyes: anicteric  Neck: Trach in place  Lungs:  Scattered rhonchi, normal effort  Heart: S1S2 slight tachycardia  Abdomen:  Soft, nontender, BS present  Extremities: Trace LE edema  Neurologic: Awake, appears depressed  Access:  R IJ permcath.       Basic Metabolic Panel:  Recent Labs Lab 09/12/15 0450 09/14/15 0530 09/14/15 1933 09/16/15 0424 09/16/15 0530  NA 138 139  --  138 141  K 3.9 3.9  --  3.8 3.6  CL 100* 100*  --  101 102  CO2 31 32  --  31 33*  GLUCOSE 132* 154*  --  146* 67  BUN 72* 65*  --  62* 44*  CREATININE 1.60* 1.41*  --  1.25* 1.01*  CALCIUM 7.8* 7.6*  --  8.0* 7.9*  PHOS 4.5  --  3.1  --   --     Liver Function Tests:  Recent Labs Lab 09/12/15 0450 09/16/15 0424 09/16/15 0530  AST 16 19 33  ALT 10* 13* 20  ALKPHOS 968* 1008* 1486*  BILITOT 1.0 0.5 0.8  PROT 4.2* 4.4* 4.2*  ALBUMIN 1.4* 1.5* 1.8*   No results for input(s): LIPASE, AMYLASE in the last 168 hours. No results for input(s):  AMMONIA in the last 168 hours.  CBC:  Recent Labs Lab 09/11/15 0522 09/12/15 0450 09/14/15 0530 09/16/15 0424 09/16/15 0530  WBC 12.6* 13.4* 14.2* 18.1* 11.9*  NEUTROABS  --   --   --  15.8* 9.7*  HGB 7.4* 7.7* 7.8* 8.2* 7.5*  HCT 24.3* 24.5* 25.2* 25.4* 24.0*  MCV 96.2 96.4 98.3 101.5* 101.8*  PLT 179 198 192 236 194    Cardiac Enzymes: No results for input(s): CKTOTAL, CKMB, CKMBINDEX, TROPONINI in the last 168 hours.  BNP: Invalid input(s): POCBNP  CBG:  Recent Labs Lab 09/16/15 1614 09/16/15 2327 09/17/15 0310 09/17/15 0732 09/17/15 0755  GLUCAP 90 67 64* 54* 89    Microbiology: Results for orders placed or performed during the hospital encounter of 07/25/2015  Blood Culture (routine x 2)     Status: None   Collection Time: 08/14/2015  8:51 AM  Result Value Ref Range Status   Specimen Description BLOOD [name]  Final   Special Requests BOTTLES DRAWN AEROBIC AND ANAEROBIC  3CC  Final   Culture NO GROWTH 5 DAYS  Final   Report Status 08/17/2015 FINAL  Final  Blood Culture (routine x 2)     Status: None   Collection Time: 09-03-15  9:20 AM  Result Value Ref Range Status   Specimen Description BLOOD LEFT ARM  Final   Special Requests BOTTLES DRAWN AEROBIC AND ANAEROBIC  1CC  Final   Culture NO GROWTH 5 DAYS  Final   Report Status 08/17/2015 FINAL  Final  Wound culture     Status: None   Collection Time: 09/03/15  9:20 AM  Result Value Ref Range Status   Specimen Description DECUBITIS  Final   Special Requests Normal  Final   Gram Stain   Final    FEW WBC SEEN MANY GRAM NEGATIVE RODS RARE GRAM POSITIVE COCCI    Culture   Final    HEAVY GROWTH ESCHERICHIA COLI MODERATE GROWTH ENTEROBACTER AEROGENES PROTEUS MIRABILIS HEAVY GROWTH ENTEROCOCCUS SPECIES VRE HAVE INTRINSIC RESISTANCE TO MOST COMMONLY USED ANTIBIOTICS AND THE ABILITY TO ACQUIRE RESISTANCE TO MOST AVAILABLE ANTIBIOTICS.    Report Status 08/16/2015 FINAL  Final   Organism ID, Bacteria ESCHERICHIA  COLI  Final   Organism ID, Bacteria ENTEROBACTER AEROGENES  Final   Organism ID, Bacteria PROTEUS MIRABILIS  Final   Organism ID, Bacteria ENTEROCOCCUS SPECIES  Final      Susceptibility   Enterobacter aerogenes - MIC*    CEFTAZIDIME <=1 SENSITIVE Sensitive     CEFAZOLIN >=64 RESISTANT Resistant     CEFTRIAXONE <=1 SENSITIVE Sensitive     CIPROFLOXACIN <=0.25 SENSITIVE Sensitive     GENTAMICIN <=1 SENSITIVE Sensitive     IMIPENEM 1 SENSITIVE Sensitive     TRIMETH/SULFA <=20 SENSITIVE Sensitive     * MODERATE GROWTH ENTEROBACTER AEROGENES   Escherichia coli - MIC*    AMPICILLIN <=2 SENSITIVE Sensitive     CEFTAZIDIME <=1 SENSITIVE Sensitive     CEFAZOLIN <=4 SENSITIVE Sensitive     CEFTRIAXONE <=1 SENSITIVE Sensitive     CIPROFLOXACIN <=0.25 SENSITIVE Sensitive     GENTAMICIN <=1 SENSITIVE Sensitive     IMIPENEM <=0.25 SENSITIVE Sensitive     TRIMETH/SULFA <=20 SENSITIVE Sensitive     * HEAVY GROWTH ESCHERICHIA COLI   Proteus mirabilis - MIC*    AMPICILLIN >=32 RESISTANT Resistant     CEFTAZIDIME <=1 SENSITIVE Sensitive     CEFAZOLIN 8 SENSITIVE Sensitive     CEFTRIAXONE <=1 SENSITIVE Sensitive     CIPROFLOXACIN <=0.25 SENSITIVE Sensitive     GENTAMICIN <=1 SENSITIVE Sensitive     IMIPENEM 1 SENSITIVE Sensitive     TRIMETH/SULFA <=20 SENSITIVE Sensitive     * PROTEUS MIRABILIS   Enterococcus species - MIC*    AMPICILLIN >=32 RESISTANT Resistant     VANCOMYCIN >=32 RESISTANT Resistant     GENTAMICIN SYNERGY SENSITIVE Sensitive     TETRACYCLINE Value in next row Resistant      RESISTANT>=16    * HEAVY GROWTH ENTEROCOCCUS SPECIES  MRSA PCR Screening     Status: None   Collection Time: 2015/09/03  2:38 PM  Result Value Ref Range Status   MRSA by PCR NEGATIVE NEGATIVE Final    Comment:        The GeneXpert MRSA Assay (FDA approved for NASAL specimens only), is one component of a comprehensive MRSA colonization surveillance program. It is not intended to diagnose  MRSA infection nor to guide or monitor treatment for MRSA infections.   Blood culture (single)     Status: None   Collection Time: 09-03-2015  3:36 PM  Result Value Ref Range Status  Specimen Description BLOOD RIGHT ASSIST CONTROL  Final   Special Requests BOTTLES DRAWN AEROBIC AND ANAEROBIC  Final   Culture NO GROWTH 5 DAYS  Final   Report Status 08/17/2015 FINAL  Final  Wound culture     Status: None   Collection Time: 08/13/15 12:37 PM  Result Value Ref Range Status   Specimen Description WOUND  Final   Special Requests Normal  Final   Gram Stain   Final    FEW WBC SEEN TOO NUMEROUS TO COUNT GRAM NEGATIVE RODS FEW GRAM POSITIVE COCCI    Culture   Final    HEAVY GROWTH ESCHERICHIA COLI MODERATE GROWTH PROTEUS MIRABILIS LIGHT GROWTH KLEBSIELLA PNEUMONIAE MODERATE GROWTH ENTEROCOCCUS GALLINARUM CRITICAL RESULT CALLED TO, READ BACK BY AND VERIFIED WITH: [facility] BORBA AT 1042 08/16/15 DV    Report Status 08/17/2015 FINAL  Final   Organism ID, Bacteria ESCHERICHIA COLI  Final   Organism ID, Bacteria PROTEUS MIRABILIS  Final   Organism ID, Bacteria KLEBSIELLA PNEUMONIAE  Final   Organism ID, Bacteria ENTEROCOCCUS GALLINARUM  Final      Susceptibility   Escherichia coli - MIC*    AMPICILLIN >=32 RESISTANT Resistant     CEFTAZIDIME 4 RESISTANT Resistant     CEFAZOLIN >=64 RESISTANT Resistant     CEFTRIAXONE 16 RESISTANT Resistant     GENTAMICIN 2 SENSITIVE Sensitive     IMIPENEM >=16 RESISTANT Resistant     TRIMETH/SULFA <=20 SENSITIVE Sensitive     Extended ESBL POSITIVE Resistant     PIP/TAZO Value in next row Resistant      RESISTANT>=128    CIPROFLOXACIN Value in next row Sensitive      SENSITIVE<=0.25    * HEAVY GROWTH ESCHERICHIA COLI   Klebsiella pneumoniae - MIC*    AMPICILLIN Value in next row Resistant      SENSITIVE<=0.25    CEFTAZIDIME Value in next row Resistant      SENSITIVE<=0.25    CEFAZOLIN Value in next row Resistant      SENSITIVE<=0.25     CEFTRIAXONE Value in next row Resistant      SENSITIVE<=0.25    CIPROFLOXACIN Value in next row Resistant      SENSITIVE<=0.25    GENTAMICIN Value in next row Sensitive      SENSITIVE<=0.25    IMIPENEM Value in next row Resistant      SENSITIVE<=0.25    TRIMETH/SULFA Value in next row Resistant      SENSITIVE<=0.25    PIP/TAZO Value in next row Resistant      RESISTANT>=128    * LIGHT GROWTH KLEBSIELLA PNEUMONIAE   Proteus mirabilis - MIC*    AMPICILLIN Value in next row Resistant      RESISTANT>=128    CEFTAZIDIME Value in next row Sensitive      RESISTANT>=128    CEFAZOLIN Value in next row Sensitive      RESISTANT>=128    CEFTRIAXONE Value in next row Sensitive      RESISTANT>=128    CIPROFLOXACIN Value in next row Sensitive      RESISTANT>=128    GENTAMICIN Value in next row Sensitive      RESISTANT>=128    IMIPENEM Value in next row Sensitive      RESISTANT>=128    TRIMETH/SULFA Value in next row Sensitive      RESISTANT>=128    PIP/TAZO Value in next row Sensitive      SENSITIVE<=4    * MODERATE GROWTH PROTEUS MIRABILIS   Enterococcus  gallinarum - MIC*    AMPICILLIN Value in next row Resistant      SENSITIVE<=4    GENTAMICIN SYNERGY Value in next row Sensitive      SENSITIVE<=4    CIPROFLOXACIN Value in next row Resistant      RESISTANT>=8    TETRACYCLINE Value in next row Resistant      RESISTANT>=16    * MODERATE GROWTH ENTEROCOCCUS GALLINARUM  Wound culture     Status: None   Collection Time: 08/15/15  2:53 PM  Result Value Ref Range Status   Specimen Description WOUND  Final   Special Requests NONE  Final   Gram Stain FEW WBC SEEN NO ORGANISMS SEEN   Final   Culture NO GROWTH 3 DAYS  Final   Report Status 08/18/2015 FINAL  Final  C difficile quick scan w PCR reflex     Status: None   Collection Time: 08/17/15 11:34 AM  Result Value Ref Range Status   C Diff antigen NEGATIVE NEGATIVE Final   C Diff toxin NEGATIVE NEGATIVE Final   C Diff  interpretation Negative for C. difficile  Final  Stool culture     Status: None   Collection Time: 09/03/15  3:51 PM  Result Value Ref Range Status   Specimen Description STOOL  Final   Special Requests Immunocompromised  Final   Culture   Final    NO SALMONELLA OR SHIGELLA ISOLATED No Pathogenic E. coli detected NO CAMPYLOBACTER DETECTED    Report Status 09/07/2015 FINAL  Final  C difficile quick scan w PCR reflex     Status: None   Collection Time: 09/13/15 12:51 AM  Result Value Ref Range Status   C Diff antigen NEGATIVE NEGATIVE Final   C Diff toxin NEGATIVE NEGATIVE Final   C Diff interpretation Negative for C. difficile  Final  Culture, blood (routine x 2)     Status: None (Preliminary result)   Collection Time: 09/16/15 11:24 AM  Result Value Ref Range Status   Specimen Description BLOOD LEFT HAND  Final   Special Requests BOTTLES DRAWN AEROBIC AND ANAEROBIC  1CC  Final   Culture NO GROWTH < 24 HOURS  Final   Report Status PENDING  Incomplete  Culture, blood (routine x 2)     Status: None (Preliminary result)   Collection Time: 09/16/15 12:23 PM  Result Value Ref Range Status   Specimen Description BLOOD RIGHT HAND  Final   Special Requests BOTTLES DRAWN AEROBIC AND ANAEROBIC  1CC  Final   Culture  Setup Time   Final    GRAM POSITIVE COCCI AEROBIC BOTTLE ONLY CRITICAL RESULT CALLED TO, READ BACK BY AND VERIFIED WITH: TESS THOMAS,RN 09/17/2015 0631 BY JRS.    Culture   Final    GRAM POSITIVE COCCI AEROBIC BOTTLE ONLY IDENTIFICATION TO FOLLOW    Report Status PENDING  Incomplete    Coagulation Studies: No results for input(s): LABPROT, INR in the last 72 hours.  Urinalysis: No results for input(s): COLORURINE, LABSPEC, PHURINE, GLUCOSEU, HGBUR, BILIRUBINUR, KETONESUR, PROTEINUR, UROBILINOGEN, NITRITE, LEUKOCYTESUR in the last 72 hours.  Invalid input(s): APPERANCEUR    Imaging: Ct Abdomen Pelvis Wo Contrast  09/16/2015  CLINICAL DATA:  49 year old female  with multiple loose bowel movements. Concern for diverticulitis or colitis. EXAM: CT ABDOMEN AND PELVIS WITHOUT CONTRAST TECHNIQUE: Multidetector CT imaging of the abdomen and pelvis was performed following the standard protocol without IV contrast. COMPARISON:  No priors. FINDINGS: Lower chest: Atherosclerotic calcifications left main, left anterior  descending, left circumflex and right coronary arteries. Catheter tip terminating in the right atrium. Trace bilateral pleural effusions. Areas of consolidation and atelectasis in the lower lobes of the lungs bilaterally where there is some mild cylindrical bronchiectasis. Hepatobiliary: Diffuse capsular calcifications along the surface of the liver. No discrete cystic or solid hepatic lesions are confidently identified on today's noncontrast CT examination. Gallbladder is not confidently identified and may be surgically absent. Pancreas: No definite pancreatic mass or peripancreatic inflammatory changes. Spleen: Capsular calcifications along the surface of the spleen. Otherwise, unremarkable. Adrenals/Urinary Tract: Bilateral perinephric stranding (nonspecific). The unenhanced appearance of the kidneys and bilateral adrenal glands is otherwise unremarkable. The unenhanced appearance of the urinary bladder is normal. Stomach/Bowel: Numerous calcifications are noted along the surface of the stomach. 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 Notably, the patient indwelling feeding tube extends through the stomach into the right side of the upper abdomen in a very superficial location immediately deep to the right lateral abdominal wall musculature. It is uncertain whether or not the tip of the tube is within the lumen of the bowel, or has extended beyond the lumen of the bowel (image 44 of series 2). The exact type of gastric bypass is uncertain on today's examination, as the anatomy is highly atypical. The  third portion of the duodenum traverses beneath the superior mesenteric artery, excluding the possibility of small bowel malrotation as an etiology for this abnormal appearance. Vascular/Lymphatic: Extensive atherosclerotic calcifications throughout the abdominal and pelvic vasculature, without definite aneurysm. No lymphadenopathy confidently identified in the abdomen or pelvis on today's noncontrast CT examination. Reproductive: Uterus and ovaries are atrophic. Other: Extensive calcifications throughout the peritoneal cavity, presumably from prior episodes of peritonitis. Diffuse mesenteric edema. Trace volume of ascites. No pneumoperitoneum. Musculoskeletal: Diffuse severe body wall edema. Deep decubitus ulcers in the right gluteal and sacral region, which extend to the underlying bone. The sacrum and coccyx are not visualized beyond S3, either surgically resected or destroyed by osteomyelitis. There is also destruction of the disc space and portions of vertebral bodies at T8-T9, suggesting discitis/osteomyelitis. Severe diffuse osteopenia, with multiple old healed bilateral rib fractures. In addition, there is a common and intertrochanteric fracture of the right hip with only minimal displacement. IMPRESSION: 1. Exceedingly limited study secondary to lack of IV contrast. No definite findings to suggest diverticulitis. Assessment of the colon is limited by lack of oral and IV contrast, but there are no overt inflammatory changes to strongly suggest a colitis at this time. 2. Patient's postoperative anatomy following gastric bypass is highly unusual and uncertain on today's examination. Patient does have an indwelling feeding tube, but the metallic tip of the feeding tube is in an unusual very superficial position in the right side of the abdomen, with an appearance whichcould suggest perforation beyond the lumen of the bowel. This could be better evaluated with followup injection of the feeding tube under  fluoroscopic visualization. 3. Diffuse body wall and mesenteric edema with trace volume of ascites and trace bilateral pleural effusions, compatible with a underlying state of anasarca. 4. Severe calcifications throughout the peritoneal cavity, presumably secondary to prior episodes of peritonitis. 5. Severe osteopenia in the bones, with a mildly comminuted intertrochanteric fracture of the right hip. There also changes at T8-T9, suggestive of discitis/osteomyelitis. 6. Deep right gluteal and sacral decubitus ulcers extending to the underlying bone, with complete absence of the distal sacrum beyond S3 and the entire coccyx, which could  be postoperative, or could be secondary to chronic osteomyelitis incomplete bony destruction. 7. Additional incidental findings, as above. Electronically Signed   By: Trudie Reed M.D.   On: 09/16/2015 01:26   Dg Abd 1 View  09/16/2015  CLINICAL DATA:  Cough opt tube evaluation EXAM: ABDOMEN - 1 VIEW COMPARISON:  CT last night FINDINGS: Contrast has been injected into the feeding tube. And ill-defined cavity is seen filled with contrast surrounding the tip of the feeding tube. Prior to this cavity, the feeding tube traverses duodenum. Duodenal loops filled with contrast likely due to retrograde passage of contrast from the cavity, along the tract, through the perforation and into duodenal loops. There is no extravasation of contrast into the peritoneal space. IMPRESSION: The study confirms that the feeding tube pes perforated through the duodenum and the tip is within a cavity that fills with injected contrast. The cavity does appear walled off. Electronically Signed   By: Jolaine Click M.D.   On: 09/16/2015 16:05   Dg Chest Port 1 View  09/16/2015  CLINICAL DATA:  Respiratory failure and increasing white blood cell count. EXAM: PORTABLE CHEST 1 VIEW COMPARISON:  09/12/2015 FINDINGS: Right jugular dialysis catheter tip at the cavoatrial junction. There is a left jugular  central line that terminates in the upper SVC region. Feeding tube extends into the abdomen but the tip is beyond the image. There is a tracheostomy tube. Again noted are slightly low lung volumes without focal airspace disease. No pulmonary edema. Heart size is within normal limits. Negative for a pneumothorax. IMPRESSION: No acute chest findings. Support apparatuses as described. Electronically Signed   By: Richarda Overlie M.D.   On: 09/16/2015 15:23     Medications:   . feeding supplement (VITAL AF 1.2 CAL) Stopped (09/16/15 1030)  . norepinephrine Stopped (09/10/15 0932)   . acidophilus  1 capsule Oral Daily  . albumin human  25 g Intravenous QODAY  . antiseptic oral rinse  7 mL Mouth Rinse q12n4p  . chlorhexidine  15 mL Mouth Rinse BID  . [START ON 09/18/2015] DAPTOmycin (CUBICIN)  IV  6 mg/kg Intravenous Q48H  . epoetin (EPOGEN/PROCRIT) injection  10,000 Units Intravenous Q M,W,F-HD  . feeding supplement (PRO-STAT SUGAR FREE 64)  30 mL Per Tube 6 times per day  . FLUoxetine  10 mg Oral Daily  . free water  200 mL Per Tube 3 times per day  . heparin subcutaneous  5,000 Units Subcutaneous Q12H  . hydrocortisone  10 mg Per Tube BID  . lidocaine  1 patch Transdermal Q24H  . meropenem (MERREM) IV  500 mg Intravenous Q24H  . midodrine  10 mg Oral TID WC  . multivitamin  5 mL Per Tube Daily  . nystatin   Topical TID  . pantoprazole sodium  40 mg Per Tube Q1200  . sodium chloride  10-40 mL Intracatheter Q12H  . sodium hypochlorite   Irrigation BID   sodium chloride, sodium chloride, sodium chloride, sodium chloride, acetaminophen (TYLENOL) oral liquid 160 mg/5 mL, HYDROmorphone (DILAUDID) injection, ipratropium-albuterol, loperamide, morphine injection, oxyCODONE-acetaminophen, sodium chloride  Assessment/ Plan:  49 y.o. 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 Admitted on 03-Sep-2015  1. End-stage renal disease on hemodialysis on HD MWF. The patient has been on dialysis since October of 2014. R IJ permcath. Hemodialysis yesterday. Tolerated well. Hypotension chronically and with treatments.  - Plan on continuing hemodialysis on MWF schedule.  - midodine and albumin with treatment.  - We have also used sodium modeling with treatments.    2. Anemia of CKD: pt continues to require periodic blood transfusions, hemoglobin of 7.5 yesterday.  - epo has not been as effective as hoped secondary due to chronic inflammation from decubitus ulcer/infection  -hgb up to 8.2, continue epogen with HD.  3. Secondary hyperparathyroidism: PTH low at 55 - phos was acceptable at 3.1 - no binders    LOS: 36 Courtney [name] 10/29/20168:44 AM

## 2015-09-17 NOTE — Progress Notes (Signed)
eLink Physician-Brief Progress Note Patient Name: Lisette GrinderHedda McMillian Monroe DOB: [birth year] MRN: [id number]   Date of Service  09/17/2015  HPI/Events of Note  Pain - temporary relief with morphine.   eICU Interventions  Will add Oxycodone 5 mg solution Q 6 hours PRN pain.     Intervention Category Intermediate Interventions: Pain - evaluation and management  Sommer,Steven Eugene 09/17/2015, 7:00 PM

## 2015-09-17 NOTE — Progress Notes (Signed)
  Patient more awake this AM. Unsure if understands my questions though. Not obviously having any abdominal pain.  PE:  ABD: Soft, Nondistended, No obvious abdominal pain  A/P: 49 year old female with multiple problems Consult for question of "perforation" from feeding tube. No peritonitis or rigid abdomen; no free air on X-Ray No evidence of "perforation". No indication for surgical intervention. Tube likely ok to use. Please call again should there be any additional surgical question  Ricarda Frameharles Avamarie Crossley, MD [facility] General Surgeon [facility] 09/17/2015

## 2015-09-17 NOTE — Progress Notes (Signed)
Nutrition Follow-up  DOCUMENTATION CODES:   Severe malnutrition in context of acute illness/injury  INTERVENTION:   Coordination of Care: spoke with Denny PeonErin RN regarding nutritional poc; husband at bedside [facility]. Noted pharmacy consult for TPN ordered this AM by MD.  Denny PeonErin RN spoke with MD and orders received to discontinue TPN orders as husband does not want this. Per Husband, MD plans to review abdominal films with possible re-initiation of TF this afternoon. If TF restarted, recommend continuing with the Vital 1.2 AF TF as previously ordered. Will continue to follow   NUTRITION DIAGNOSIS:   Inadequate oral intake related to wound healing, acute illness, chronic illness as evidenced by NPO status, estimated needs. Continues  GOAL:   Patient will meet greater than or equal to 90% of their needs   MONITOR:    (Energy Intake, Anthropometrics, Digestive System, Electrolyte/Renal Profile, Glucose Profile)  REASON FOR ASSESSMENT:   Consult Enteral/tube feeding initiation and management  ASSESSMENT:    Pt with non-displaced intertrochanteric fracture right hip, seen by  Ortho MD with plans for non-surgical management at this time. Pt was also evaluated by MD Tonita CongWoodham with regards to possible "perforation" of feeding tube with no indication for surgical intervention at this time, dobhoff tube likely ok to use per [facility]. D10 at 25 ml/hr being initiated for hypoglycemia [facility]. Pt currently NPO  Diet Order:  Diet NPO time specified Except for: Other (See Comments)   Energy Intake: all nutrition on hold at present (oral diet, TF, Prostat)  Skin:   (stage IV sacrum, stage III hip, unstageable foot; rectal ulcer from fleixseal, ulcer in nose from dobhoff)  Electrolyte and Renal Profile:  Recent Labs Lab 09/12/15 0450 09/14/15 0530 09/14/15 1933 09/16/15 0424 09/16/15 0530  BUN 72* 65*  --  62* 44*  CREATININE 1.60* 1.41*  --  1.25* 1.01*   NA 138 139  --  138 141  K 3.9 3.9  --  3.8 3.6  PHOS 4.5  --  3.1  --   --    Glucose Profile:  Recent Labs  09/17/15 1212 09/17/15 1215 09/17/15 1343  GLUCAP 61* 62* 99   Meds: reviewed, D10 at 25 ml/hr (204 kcals in 24 hours)  Height:   Ht Readings from Last 1 Encounters:  07/28/2015 5\' 9"  (1.753 m)    Weight:   Wt Readings from Last 1 Encounters:  09/17/15 205 lb 0.4 oz (93 kg)    BMI:  Body mass index is 30.26 kg/(m^2).  Estimated Nutritional Needs:   Kcal:  2175-2535 kcals (BEE 1509, 1.2 AF, 1.2-1.4 IF) or 2490-2905 kcals (30-35 kcals/kg)   Protein:  125-166 g (1.5-2.0 g/kg) but likely closer to 166-208 g (2.0-2.5 g/kg)   Fluid:  1000 mL plus UOP  EDUCATION NEEDS:   No education needs identified at this time  HIGH Care Level  Romelle Starcherate Jolene Guyett MS, RD, LDN [phone] Pager

## 2015-09-17 NOTE — Progress Notes (Signed)
Patient was lethargic in am, blood glucose dropped < 70 x 2, Dextrose administered x 2 with improvement. IVF initiated with dextrose. Dressing changed per orders, trach care completed, central line dressing change completed. Patient rested quietly between care. Medication for pain x 1. Husband and children at bedside in evening.

## 2015-09-18 ENCOUNTER — Inpatient Hospital Stay: Payer: 59

## 2015-09-18 LAB — COMPREHENSIVE METABOLIC PANEL
ALK PHOS: 1487 U/L — AB (ref 38–126)
ALT: 20 U/L (ref 14–54)
AST: 28 U/L (ref 15–41)
Albumin: 1.6 g/dL — ABNORMAL LOW (ref 3.5–5.0)
Anion gap: 6 (ref 5–15)
BILIRUBIN TOTAL: 0.6 mg/dL (ref 0.3–1.2)
BUN: 46 mg/dL — AB (ref 6–20)
CALCIUM: 7.6 mg/dL — AB (ref 8.9–10.3)
CO2: 31 mmol/L (ref 22–32)
CREATININE: 1.3 mg/dL — AB (ref 0.44–1.00)
Chloride: 98 mmol/L — ABNORMAL LOW (ref 101–111)
GFR, EST AFRICAN AMERICAN: 54 mL/min — AB (ref >60)
GFR, EST NON AFRICAN AMERICAN: 47 mL/min — AB (ref >60)
Glucose, Bld: 305 mg/dL — ABNORMAL HIGH (ref 65–99)
Potassium: 3.6 mmol/L (ref 3.5–5.1)
Sodium: 135 mmol/L (ref 135–145)
Total Protein: 4.1 g/dL — ABNORMAL LOW (ref 6.5–8.1)

## 2015-09-18 LAB — CBC
HEMATOCRIT: 23 % — AB (ref 35.0–47.0)
HEMOGLOBIN: 7.2 g/dL — AB (ref 12.0–16.0)
MCH: 31.9 pg (ref 26.0–34.0)
MCHC: 31.1 g/dL — ABNORMAL LOW (ref 32.0–36.0)
MCV: 102.6 fL — AB (ref 80.0–100.0)
Platelets: 205 10*3/uL (ref 150–440)
RBC: 2.24 MIL/uL — AB (ref 3.80–5.20)
RDW: 24.9 % — ABNORMAL HIGH (ref 11.5–14.5)
WBC: 9.9 10*3/uL (ref 3.6–11.0)

## 2015-09-18 LAB — GLUCOSE, CAPILLARY
GLUCOSE-CAPILLARY: 108 mg/dL — AB (ref 65–99)
GLUCOSE-CAPILLARY: 82 mg/dL (ref 65–99)
Glucose-Capillary: 70 mg/dL (ref 65–99)
Glucose-Capillary: 67 mg/dL (ref 65–99)
Glucose-Capillary: 102 mg/dL — ABNORMAL HIGH (ref 65–99)
Glucose-Capillary: 71 mg/dL (ref 65–99)

## 2015-09-18 MED ORDER — PRO-STAT SUGAR FREE PO LIQD
30.0000 mL | ORAL | Status: DC
  Administered 2015-09-18 – 2015-09-20 (×12): 30 mL

## 2015-09-18 MED ORDER — VITAL HIGH PROTEIN PO LIQD: 1000.0000 mL | ORAL | Status: DC

## 2015-09-18 MED ORDER — HYDROMORPHONE HCL 1 MG/ML IJ SOLN
0.5000 mg | Freq: Two times a day (BID) | INTRAMUSCULAR | Status: DC | PRN
  Administered 2015-09-18 – 2015-11-21 (×88): 0.5 mg via INTRAVENOUS
  Filled 2015-09-18 (×90): qty 1

## 2015-09-18 MED ORDER — VITAL AF 1.2 CAL PO LIQD
1000.0000 mL | ORAL | Status: DC
  Administered 2015-09-18 – 2015-09-20 (×3): 1000 mL

## 2015-09-18 MED ORDER — MIDODRINE HCL 5 MG PO TABS
5.0000 mg | ORAL_TABLET | Freq: Three times a day (TID) | ORAL | Status: DC
  Administered 2015-09-18 – 2015-11-11 (×159): 5 mg
  Filled 2015-09-18 (×163): qty 1

## 2015-09-18 MED ORDER — ACETAMINOPHEN 160 MG/5ML PO SOLN
650.0000 mg | Freq: Four times a day (QID) | ORAL | Status: DC | PRN
  Administered 2015-09-19 – 2015-11-06 (×19): 650 mg via ORAL
  Filled 2015-09-18 (×20): qty 20.3

## 2015-09-18 MED ORDER — DEXTROSE 50 % IV SOLN
25.0000 mL | Freq: Once | INTRAVENOUS | Status: AC
Start: 2015-09-18 — End: 2015-09-18
  Administered 2015-09-18: 25 mL via INTRAVENOUS
  Filled 2015-09-18: qty 50

## 2015-09-18 NOTE — Progress Notes (Signed)
Subjective:  Hypoglycemia. Started on D10 infusion.  Not a candidate for TPN.  Husband at bedside.    Objective:  Vital signs in last 24 hours:  Temp:  [98.5 F (36.9 C)-98.9 F (37.2 C)] 98.5 F (36.9 C) (10/30 0221) Pulse Rate:  [97-114] 100 (10/30 0600) Resp:  [0-28] 15 (10/30 0600) BP: (113-140)/(50-118) 138/118 mmHg (10/30 0600) SpO2:  [96 %-100 %] 100 % (10/30 0600) FiO2 (%):  [28 %] 28 % (10/30 0301) Weight:  [85 kg (187 lb 6.3 oz)] 85 kg (187 lb 6.3 oz) (10/30 0500)  Weight change: -7 kg (-15 lb 6.9 oz) Filed Weights   09/16/15 1445 09/17/15 0500 09/18/15 0500  Weight: 93 kg (205 lb 0.4 oz) 93 kg (205 lb 0.4 oz) 85 kg (187 lb 6.3 oz)    Intake/Output: I/O last 3 completed shifts: In: 438.3 [I.V.:388.3; IV Piggyback:50] Out: 1 [Stool:1]   Intake/Output this shift:     Physical Exam: General: No acute distress, critically ill  Head: [place]/AT eyes open NG in place  Eyes: anicteric  Neck: Trach in place  Lungs:  Scattered rhonchi, normal effort  Heart: S1S2 +tachycardia  Abdomen:  Soft, nontender, BS present  Extremities: Trace LE edema, right upper extremity 1+ edema  Neurologic: Awake, appears depressed  Access:  R IJ permcath.       Basic Metabolic Panel:  Recent Labs Lab 09/12/15 0450 09/14/15 0530 09/14/15 1933 09/16/15 0424 09/16/15 0530 09/18/15 0554  NA 138 139  --  138 141 135  K 3.9 3.9  --  3.8 3.6 3.6  CL 100* 100*  --  101 102 98*  CO2 31 32  --  31 33* 31  GLUCOSE 132* 154*  --  146* 67 305*  BUN 72* 65*  --  62* 44* 46*  CREATININE 1.60* 1.41*  --  1.25* 1.01* 1.30*  CALCIUM 7.8* 7.6*  --  8.0* 7.9* 7.6*  PHOS 4.5  --  3.1  --   --   --     Liver Function Tests:  Recent Labs Lab 09/12/15 0450 09/16/15 0424 09/16/15 0530 09/18/15 0554  AST 16 19 33 28  ALT 10* 13* 20 20  ALKPHOS 968* 1008* 1486* 1487*  BILITOT 1.0 0.5 0.8 0.6  PROT 4.2* 4.4* 4.2* 4.1*  ALBUMIN 1.4* 1.5* 1.8* 1.6*   No results for input(s): LIPASE,  AMYLASE in the last 168 hours. No results for input(s): AMMONIA in the last 168 hours.  CBC:  Recent Labs Lab 09/12/15 0450 09/14/15 0530 09/16/15 0424 09/16/15 0530 09/18/15 0554  WBC 13.4* 14.2* 18.1* 11.9* 9.9  NEUTROABS  --   --  15.8* 9.7*  --   HGB 7.7* 7.8* 8.2* 7.5* 7.2*  HCT 24.5* 25.2* 25.4* 24.0* 23.0*  MCV 96.4 98.3 101.5* 101.8* 102.6*  PLT 198 192 236 194 205    Cardiac Enzymes: No results for input(s): CKTOTAL, CKMB, CKMBINDEX, TROPONINI in the last 168 hours.  BNP: Invalid input(s): POCBNP  CBG:  Recent Labs Lab 09/18/15 0034 09/18/15 0325 09/18/15 0445 09/18/15 0609 09/18/15 0736  GLUCAP 108* 70 67 102* 82    Microbiology: Results for orders placed or performed during the hospital encounter of 2015/05/23  Blood Culture (routine x 2)     Status: None   Collection Time: 2015/05/23  8:51 AM  Result Value Ref Range Status   Specimen Description BLOOD Dolores HooseUNKO  Final   Special Requests BOTTLES DRAWN AEROBIC AND ANAEROBIC  3CC  Final   Culture  NO GROWTH 5 DAYS  Final   Report Status 08/17/2015 FINAL  Final  Blood Culture (routine x 2)     Status: None   Collection Time: 08-16-2015  9:20 AM  Result Value Ref Range Status   Specimen Description BLOOD LEFT ARM  Final   Special Requests BOTTLES DRAWN AEROBIC AND ANAEROBIC  1CC  Final   Culture NO GROWTH 5 DAYS  Final   Report Status 08/17/2015 FINAL  Final  Wound culture     Status: None   Collection Time: 08-16-15  9:20 AM  Result Value Ref Range Status   Specimen Description DECUBITIS  Final   Special Requests Normal  Final   Gram Stain   Final    FEW WBC SEEN MANY GRAM NEGATIVE RODS RARE GRAM POSITIVE COCCI    Culture   Final    HEAVY GROWTH ESCHERICHIA COLI MODERATE GROWTH ENTEROBACTER AEROGENES PROTEUS MIRABILIS HEAVY GROWTH ENTEROCOCCUS SPECIES VRE HAVE INTRINSIC RESISTANCE TO MOST COMMONLY USED ANTIBIOTICS AND THE ABILITY TO ACQUIRE RESISTANCE TO MOST AVAILABLE ANTIBIOTICS.    Report Status  08/16/2015 FINAL  Final   Organism ID, Bacteria ESCHERICHIA COLI  Final   Organism ID, Bacteria ENTEROBACTER AEROGENES  Final   Organism ID, Bacteria PROTEUS MIRABILIS  Final   Organism ID, Bacteria ENTEROCOCCUS SPECIES  Final      Susceptibility   Enterobacter aerogenes - MIC*    CEFTAZIDIME <=1 SENSITIVE Sensitive     CEFAZOLIN >=64 RESISTANT Resistant     CEFTRIAXONE <=1 SENSITIVE Sensitive     CIPROFLOXACIN <=0.25 SENSITIVE Sensitive     GENTAMICIN <=1 SENSITIVE Sensitive     IMIPENEM 1 SENSITIVE Sensitive     TRIMETH/SULFA <=20 SENSITIVE Sensitive     * MODERATE GROWTH ENTEROBACTER AEROGENES   Escherichia coli - MIC*    AMPICILLIN <=2 SENSITIVE Sensitive     CEFTAZIDIME <=1 SENSITIVE Sensitive     CEFAZOLIN <=4 SENSITIVE Sensitive     CEFTRIAXONE <=1 SENSITIVE Sensitive     CIPROFLOXACIN <=0.25 SENSITIVE Sensitive     GENTAMICIN <=1 SENSITIVE Sensitive     IMIPENEM <=0.25 SENSITIVE Sensitive     TRIMETH/SULFA <=20 SENSITIVE Sensitive     * HEAVY GROWTH ESCHERICHIA COLI   Proteus mirabilis - MIC*    AMPICILLIN >=32 RESISTANT Resistant     CEFTAZIDIME <=1 SENSITIVE Sensitive     CEFAZOLIN 8 SENSITIVE Sensitive     CEFTRIAXONE <=1 SENSITIVE Sensitive     CIPROFLOXACIN <=0.25 SENSITIVE Sensitive     GENTAMICIN <=1 SENSITIVE Sensitive     IMIPENEM 1 SENSITIVE Sensitive     TRIMETH/SULFA <=20 SENSITIVE Sensitive     * PROTEUS MIRABILIS   Enterococcus species - MIC*    AMPICILLIN >=32 RESISTANT Resistant     VANCOMYCIN >=32 RESISTANT Resistant     GENTAMICIN SYNERGY SENSITIVE Sensitive     TETRACYCLINE Value in next row Resistant      RESISTANT>=16    * HEAVY GROWTH ENTEROCOCCUS SPECIES  MRSA PCR Screening     Status: None   Collection Time: 08/16/15  2:38 PM  Result Value Ref Range Status   MRSA by PCR NEGATIVE NEGATIVE Final    Comment:        The GeneXpert MRSA Assay (FDA approved for NASAL specimens only), is one component of a comprehensive MRSA  colonization surveillance program. It is not intended to diagnose MRSA infection nor to guide or monitor treatment for MRSA infections.   Blood culture (single)     Status:  None   Collection Time: 09-06-15  3:36 PM  Result Value Ref Range Status   Specimen Description BLOOD RIGHT ASSIST CONTROL  Final   Special Requests BOTTLES DRAWN AEROBIC AND ANAEROBIC  Final   Culture NO GROWTH 5 DAYS  Final   Report Status 08/17/2015 FINAL  Final  Wound culture     Status: None   Collection Time: 08/13/15 12:37 PM  Result Value Ref Range Status   Specimen Description WOUND  Final   Special Requests Normal  Final   Gram Stain   Final    FEW WBC SEEN TOO NUMEROUS TO COUNT GRAM NEGATIVE RODS FEW GRAM POSITIVE COCCI    Culture   Final    HEAVY GROWTH ESCHERICHIA COLI MODERATE GROWTH PROTEUS MIRABILIS LIGHT GROWTH KLEBSIELLA PNEUMONIAE MODERATE GROWTH ENTEROCOCCUS GALLINARUM CRITICAL RESULT CALLED TO, READ BACK BY AND VERIFIED WITH: [facility] BORBA AT 1042 08/16/15 DV    Report Status 08/17/2015 FINAL  Final   Organism ID, Bacteria ESCHERICHIA COLI  Final   Organism ID, Bacteria PROTEUS MIRABILIS  Final   Organism ID, Bacteria KLEBSIELLA PNEUMONIAE  Final   Organism ID, Bacteria ENTEROCOCCUS GALLINARUM  Final      Susceptibility   Escherichia coli - MIC*    AMPICILLIN >=32 RESISTANT Resistant     CEFTAZIDIME 4 RESISTANT Resistant     CEFAZOLIN >=64 RESISTANT Resistant     CEFTRIAXONE 16 RESISTANT Resistant     GENTAMICIN 2 SENSITIVE Sensitive     IMIPENEM >=16 RESISTANT Resistant     TRIMETH/SULFA <=20 SENSITIVE Sensitive     Extended ESBL POSITIVE Resistant     PIP/TAZO Value in next row Resistant      RESISTANT>=128    CIPROFLOXACIN Value in next row Sensitive      SENSITIVE<=0.25    * HEAVY GROWTH ESCHERICHIA COLI   Klebsiella pneumoniae - MIC*    AMPICILLIN Value in next row Resistant      SENSITIVE<=0.25    CEFTAZIDIME Value in next row Resistant      SENSITIVE<=0.25     CEFAZOLIN Value in next row Resistant      SENSITIVE<=0.25    CEFTRIAXONE Value in next row Resistant      SENSITIVE<=0.25    CIPROFLOXACIN Value in next row Resistant      SENSITIVE<=0.25    GENTAMICIN Value in next row Sensitive      SENSITIVE<=0.25    IMIPENEM Value in next row Resistant      SENSITIVE<=0.25    TRIMETH/SULFA Value in next row Resistant      SENSITIVE<=0.25    PIP/TAZO Value in next row Resistant      RESISTANT>=128    * LIGHT GROWTH KLEBSIELLA PNEUMONIAE   Proteus mirabilis - MIC*    AMPICILLIN Value in next row Resistant      RESISTANT>=128    CEFTAZIDIME Value in next row Sensitive      RESISTANT>=128    CEFAZOLIN Value in next row Sensitive      RESISTANT>=128    CEFTRIAXONE Value in next row Sensitive      RESISTANT>=128    CIPROFLOXACIN Value in next row Sensitive      RESISTANT>=128    GENTAMICIN Value in next row Sensitive      RESISTANT>=128    IMIPENEM Value in next row Sensitive      RESISTANT>=128    TRIMETH/SULFA Value in next row Sensitive      RESISTANT>=128    PIP/TAZO Value in next row Sensitive  SENSITIVE<=4    * MODERATE GROWTH PROTEUS MIRABILIS   Enterococcus gallinarum - MIC*    AMPICILLIN Value in next row Resistant      SENSITIVE<=4    GENTAMICIN SYNERGY Value in next row Sensitive      SENSITIVE<=4    CIPROFLOXACIN Value in next row Resistant      RESISTANT>=8    TETRACYCLINE Value in next row Resistant      RESISTANT>=16    * MODERATE GROWTH ENTEROCOCCUS GALLINARUM  Wound culture     Status: None   Collection Time: 08/15/15  2:53 PM  Result Value Ref Range Status   Specimen Description WOUND  Final   Special Requests NONE  Final   Gram Stain FEW WBC SEEN NO ORGANISMS SEEN   Final   Culture NO GROWTH 3 DAYS  Final   Report Status 08/18/2015 FINAL  Final  C difficile quick scan w PCR reflex     Status: None   Collection Time: 08/17/15 11:34 AM  Result Value Ref Range Status   C Diff antigen NEGATIVE NEGATIVE Final    C Diff toxin NEGATIVE NEGATIVE Final   C Diff interpretation Negative for C. difficile  Final  Stool culture     Status: None   Collection Time: 09/03/15  3:51 PM  Result Value Ref Range Status   Specimen Description STOOL  Final   Special Requests Immunocompromised  Final   Culture   Final    NO SALMONELLA OR SHIGELLA ISOLATED No Pathogenic E. coli detected NO CAMPYLOBACTER DETECTED    Report Status 09/07/2015 FINAL  Final  C difficile quick scan w PCR reflex     Status: None   Collection Time: 09/13/15 12:51 AM  Result Value Ref Range Status   C Diff antigen NEGATIVE NEGATIVE Final   C Diff toxin NEGATIVE NEGATIVE Final   C Diff interpretation Negative for C. difficile  Final  Culture, blood (routine x 2)     Status: None (Preliminary result)   Collection Time: 09/16/15 11:24 AM  Result Value Ref Range Status   Specimen Description BLOOD LEFT HAND  Final   Special Requests BOTTLES DRAWN AEROBIC AND ANAEROBIC  1CC  Final   Culture NO GROWTH 2 DAYS  Final   Report Status PENDING  Incomplete  Culture, blood (routine x 2)     Status: None (Preliminary result)   Collection Time: 09/16/15 12:23 PM  Result Value Ref Range Status   Specimen Description BLOOD RIGHT HAND  Final   Special Requests BOTTLES DRAWN AEROBIC AND ANAEROBIC  1CC  Final   Culture  Setup Time   Final    GRAM POSITIVE COCCI AEROBIC BOTTLE ONLY CRITICAL RESULT CALLED TO, READ BACK BY AND VERIFIED WITH: TESS THOMAS,RN 09/17/2015 0631 BY JRS.    Culture   Final    GRAM POSITIVE COCCI AEROBIC BOTTLE ONLY IDENTIFICATION TO FOLLOW    Report Status PENDING  Incomplete  Culture, respiratory (NON-Expectorated)     Status: None (Preliminary result)   Collection Time: 09/16/15  3:50 PM  Result Value Ref Range Status   Specimen Description TRACHEAL ASPIRATE  Final   Special Requests Immunocompromised  Final   Gram Stain   Final    FEW WBC SEEN FEW SQUAMOUS EPITHELIAL CELLS PRESENT RARE GRAM NEGATIVE RODS     Culture   Final    LIGHT GROWTH GRAM NEGATIVE RODS IDENTIFICATION AND SUSCEPTIBILITIES TO FOLLOW    Report Status PENDING  Incomplete    Coagulation Studies:  No results for input(s): LABPROT, INR in the last 72 hours.  Urinalysis: No results for input(s): COLORURINE, LABSPEC, PHURINE, GLUCOSEU, HGBUR, BILIRUBINUR, KETONESUR, PROTEINUR, UROBILINOGEN, NITRITE, LEUKOCYTESUR in the last 72 hours.  Invalid input(s): APPERANCEUR    Imaging: Dg Abd 1 View  09/17/2015  CLINICAL DATA:  Gastric perforation, evaluate for free air EXAM: ABDOMEN - 1 VIEW COMPARISON:  Abdominal radiograph dated 09/08/2015. CT abdomen pelvis dated 09/08/2015. FINDINGS: Nonobstructive bowel gas pattern. Weighted feeding tube terminates over the right lateral abdomen, in an extraluminal location when correlating with recent prior studies. No evidence of free air under the diaphragm on the upright view. Notably, there is no free air on recent CT, only a small amount of localized fluid/gas within the anterior abdominal wall (the likely site where the weighted feeding tube terminates). IMPRESSION: No free air is seen. Electronically Signed   By: Charline Bills M.D.   On: 09/17/2015 13:02   Dg Abd 1 View  09/16/2015  CLINICAL DATA:  Cough opt tube evaluation EXAM: ABDOMEN - 1 VIEW COMPARISON:  CT last night FINDINGS: Contrast has been injected into the feeding tube. And ill-defined cavity is seen filled with contrast surrounding the tip of the feeding tube. Prior to this cavity, the feeding tube traverses duodenum. Duodenal loops filled with contrast likely due to retrograde passage of contrast from the cavity, along the tract, through the perforation and into duodenal loops. There is no extravasation of contrast into the peritoneal space. IMPRESSION: The study confirms that the feeding tube pes perforated through the duodenum and the tip is within a cavity that fills with injected contrast. The cavity does appear walled off.  Electronically Signed   By: Jolaine Click M.D.   On: 09/16/2015 16:05   Dg Chest Port 1 View  09/16/2015  CLINICAL DATA:  Respiratory failure and increasing white blood cell count. EXAM: PORTABLE CHEST 1 VIEW COMPARISON:  09/12/2015 FINDINGS: Right jugular dialysis catheter tip at the cavoatrial junction. There is a left jugular central line that terminates in the upper SVC region. Feeding tube extends into the abdomen but the tip is beyond the image. There is a tracheostomy tube. Again noted are slightly low lung volumes without focal airspace disease. No pulmonary edema. Heart size is within normal limits. Negative for a pneumothorax. IMPRESSION: No acute chest findings. Support apparatuses as described. Electronically Signed   By: Richarda Overlie M.D.   On: 09/16/2015 15:23     Medications:   . dextrose 25 mL/hr at 09/17/15 1700   . acidophilus  1 capsule Oral Daily  . albumin human  25 g Intravenous QODAY  . antiseptic oral rinse  7 mL Mouth Rinse q12n4p  . chlorhexidine  15 mL Mouth Rinse BID  . DAPTOmycin (CUBICIN)  IV  6 mg/kg Intravenous Q48H  . epoetin (EPOGEN/PROCRIT) injection  10,000 Units Intravenous Q M,W,F-HD  . heparin subcutaneous  5,000 Units Subcutaneous Q12H  . hydrocortisone  10 mg Per Tube BID  . lidocaine  1 patch Transdermal Q24H  . meropenem (MERREM) IV  500 mg Intravenous Q24H  . nystatin   Topical TID  . pantoprazole (PROTONIX) IV  40 mg Intravenous Q24H  . sodium chloride  10-40 mL Intracatheter Q12H  . sodium hypochlorite   Irrigation BID   sodium chloride, sodium chloride, HYDROmorphone (DILAUDID) injection, ipratropium-albuterol, loperamide, morphine injection, oxyCODONE, sodium chloride  Assessment/ Plan:  49 y.o. 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 Admitted on 09-04-2015  1. End-stage renal disease on hemodialysis on HD MWF. The patient has been on dialysis since October of 2014. R IJ permcath. Hemodialysis Friday. Tolerated well. Hypotension chronically and with treatments.  - Plan on continuing hemodialysis on MWF schedule.  - midodine and albumin with treatment. This was given daily.  - We have also used sodium modeling with treatments.    2. Anemia of CKD: pt continues to require periodic blood transfusions, hemoglobin of 7.5 yesterday.  - epo has not been as effective as hoped secondary due to chronic inflammation from decubitus ulcer/infection  -hgb up to 8.2, continue epogen with HD. This has been ordered as a standing order.   3. Secondary hyperparathyroidism: PTH low at 55 - phos was acceptable at 3.1 - no binders  4. Sepsis: currently off vasopressors. Hemodynamically stable. Blood cultures from 10/28 with no growth.  - daptomycin and meropenem    LOS: 37 Courtney [name] 10/30/20169:01 AM

## 2015-09-18 NOTE — Progress Notes (Signed)
Nutrition Follow-up  DOCUMENTATION CODES:   Severe malnutrition in context of acute illness/injury  INTERVENTION:   EN: received consult for initiation and management of TF. Adult Tube Feeding Protocol ordered this AM with order for Vital High Protein. Pt previously on Vital 1.2 AF at rate of 50 ml/hr with  Prostat 6 times daily; recommend restarting this regimen. Recommend starting at 20 ml/hr but titrating q 4 hours until goal if pt tolerating, as pt previously tolerating TF at rate of 50 ml/hr   NUTRITION DIAGNOSIS:   Inadequate oral intake related to wound healing, acute illness, chronic illness as evidenced by NPO status, estimated needs.  GOAL:   Patient will meet greater than or equal to 90% of their needs  MONITOR:    (Energy Intake, Anthropometrics, Digestive System, Electrolyte/Renal Profile, Glucose Profile)  REASON FOR ASSESSMENT:   Consult Enteral/tube feeding initiation and management  ASSESSMENT:    Per MD notes, KUB with no free air, dobhoff tube has been pulled back as recommended by MD, Surgery believes perforation is likely residual from prior fistula, cavity is walled off  Diet Order:  Diet NPO time specified Except for: Other (See Comments)  Skin:   (stage IV sacrum, stage III hip, unstageable foot; rectal ulcer from fleixseal, ulcer in nose from dobhoff)  Digestive System: dobhoff in place, pt with yellow loose stool yesterday even with TF on hold, counted 2-3 BMs yesterday per documentation  Electrolyte and Renal Profile:  Recent Labs Lab 09/12/15 0450  09/14/15 1933 09/16/15 0424 09/16/15 0530 09/18/15 0554  BUN 72*  < >  --  62* 44* 46*  CREATININE 1.60*  < >  --  1.25* 1.01* 1.30*  NA 138  < >  --  138 141 135  K 3.9  < >  --  3.8 3.6 3.6  PHOS 4.5  --  3.1  --   --   --   < > = values in this interval not displayed. Glucose Profile:  Recent Labs  09/18/15 0445 09/18/15 0609 09/18/15 0736  GLUCAP 67 102* 82   Meds: D10 order  discontinued,other meds reviewed  Height:   Ht Readings from Last 1 Encounters:  August 15, 2015 5\' 9"  (1.753 m)    Weight:   Wt Readings from Last 1 Encounters:  09/18/15 187 lb 6.3 oz (85 kg)    BMI:  Body mass index is 27.66 kg/(m^2).  Estimated Nutritional Needs:   Kcal:  2175-2535 kcals (BEE 1509, 1.2 AF, 1.2-1.4 IF) or 2490-2905 kcals (30-35 kcals/kg)   Protein:  125-166 g (1.5-2.0 g/kg) but likely closer to 166-208 g (2.0-2.5 g/kg)   Fluid:  1000 mL plus UOP  EDUCATION NEEDS:   No education needs identified at this time  HIGH Care Level  Romelle Starcherate Jarron Curley MS, RD, LDN [phone] Pager

## 2015-09-18 NOTE — Progress Notes (Signed)
[facility] Progess Note  Name: Courtney [name] MRN: [id number] DOB: 1966-03-25    ADMISSION DATE:  08/10/2015   INTERVAL HISTORY: 10/30: TFs resumed 10/29 KUB: No free air 10/28 Ortho consultation: I do not feel that she is a surgical candidate. Therefore, I feel that it would best to manage this fracture nonsurgically and allow it to heal by itself over time, which it should.  10/28 Gen Surg consultation: Due to the lack of free air or free flow of contrast and the peritoneum there is no indication for any surgical intervention on this. Would recommend pulling back the feeding tube 1-2 cm. Would recheck an abdominal film to confirm no pneumoperitoneum in the morning. Absence of any changes okay to continue using Dobbhoff for feeding and medications. 10/28 gastrograffin study: The study confirms that the feeding tube pes perforated through the duodenum and the tip is within a cavity that fills with injected contrast. The cavity does appear walled off 10/28-patient with mildly depressed mentation this morning, seems a little bit more somnolent than usual. We'll move on an open eyes but not as interactive. Take half a teaspoon of liquids yesterday by mouth with Passy-Muir valve in place. Overnight increasing white cell count 18,000, CT abdomen and pelvis done showed a right hip fracture small, unable to properly locate the metallic tip of the gastric tube, recommended fluoroscopic study. CT also showed osteomyelitis. Plan today is to resume daptomycin and meropenem, stop amantadine due to elevated alkaline phosphatase, orthopedic evaluation for mild/small right hip fracture.  10/27-showing large bowel movement last night, yellow in nature. Today team and decided to get a CT abdomen and pelvis without contrast to evaluate for diverticulitis/colitis/sigmoiditis and overall anatomy of her bowels. Change tube feeds from Jevity to Vita.  Patient tolerating speaking valve,  we'll attempt to feed with the use of a speaking valve while still having them off in place. Tube feedings to stop 10 minutes before attempting by mouth feeds. 0.$RemoveBefore'5mg'[id number]$  Dilaudid PRN with sacral dressing changes only.    10/26- had 3 loose stools overnight, still off vasopressors. Blood pressure stable overnight, no fever, white cell count mildly elevated at 14.2, small ulcer on lateral aspect of right foot and right ankle. Increase tube feeds to 50 mL. HD today  10/25- off vasopressors, prozac started yesterday, BP stable overnight, abx stopped yesterday, seen by PT and speech tx yesterday, mild inc in wbc, recheck labs in the AM. Two loose stools yesterday, cdiff neg.  10/24-off vasopressors, started prozac today, HD this PM and assess BP and vasopressors needs Follow CBC, repeat CXR pending, plan for Speech for small oral feeds, increased TF rate to 40 (goal is 60), abx stopped for now,    10/23- off vasopressors, 1 soft BM overnight, low H/H this AM, husband at bedside.   10/22 - off vasopressors, tolerated slow HD yesterday, tracking with eyes, and will nod head to answer questions. Sacral decub debrided at bedside yesterday. Restarted on levophed 23mcg during dialysis, stopped this AM.   10/21-off vasopressors s/p wound debridement at bedside by gen surgery Morphine given for severe pain, plan for slow HD today and assess BP Continue step down monitoring, plan for TEE next week will discuss with Husband  10/20-transferred to step down for sepsis/aspiration pneumonia, weaned off levophed this AM -abx adjusted, continue TF's, will NOT start oral feeds Plan for TEE at some point, appreciate surgical input, obtain CBC,chem 7, CXR as needed Will update husband today  10/19-patient with  minimal amounts of food this AM, will plan to continue oral intake along with Tube feedings and assess calorie count This was discussed with Husband, plan to stop TF's tomorrow if she tolerates oral feeds and  remove NG tube in next 2 days Will talk and discuss with Husband that patient is very high risk for aspiration-will obtain Surgery consult to reassess decub ulcer Will consult cardiology to assess for TEE  10/18 discussed with husband-patient passed swallow eval spoke with Speech Therapist Will add Pureed thick nectar foods and continue Enteral feeds through NG tube.  Will add Megace   On 10/16  the patient's husband was upset due to placement of a rectal tube by on-call physician, this was removed. It was also noted that the pt developed diarrhea, therefore Jevity was changed from 1.5 with fiber to Jevity 1.2; the rate was decreased to 20 cc/hr and the pt was given a dose of lomotil, which has helped the diarrhea. It was also sent for stool culture and Cdif.   On 10/17 discussed with husband options going forward. He would like to transfer to [facility] as it is closer to him, also  [facility] is there so he can have his wife evaluated better and that he can have the [facility] come and check on her status personally-Dr. Mortimer Fries has discussed case with Hospitalist and they pretty much have refused to accept her at this time.   -He declines transfer of his wife back to [place].   -He would like to repeat speech evaluation and Pasey-Muir valve as he hopes that his wife will be able to eat and then will not need a feeding tube. He understands that she may be "stuck" in the hospital as currently no local LTAC is accepting her. He is continuing discussions with [facility].   -He would like his wife restarted on aspirin due to stroke risk. It was explained that given her life threatening bleed the rounding physician did not feel comfortable restarting this medication at this time, but he could revisit this discussion with other physicians in the future.    INITIAL PRESENTATION:  69 F who has been in medical facilities (hosp, LTAC, rehab) for 2 yrs following gastric bypass surgery with multiple complications. Now with  chronic trach, ESRD, profound debilitation, severe sacral pressure ulcer. Was seemingly making progress and transferred to rehab facility approx one week prior to this admission. She was sent to [facility] ED with AMS and hypotension. Working dx of severe sepsis/septic shock due to infected sacral pressure ulcer. Also has R side externalized HD cath and tunneled L IJ CVL. She has history of resistant bacterial infections. She is on chronic steroids. Her husband indicates that she has been diagnosed with adrenal insuff at some point during the past 2 yrs  MAJOR EVENTS/TEST RESULTS: Admission 02/07/14-05/07/14 Admission 07/21/14-09/06/14 Discharged to [facility]. Pt had palliative consult at that time, were asked to sign off by husband.  09/23 CT head: NAD 09/23 EEG: no epileptiform activity 09/23 PRBCs for Hgb 6.4 09/24 bedside debridement of sacral wound. Abscess drained 09/25 Off vasopressors. More alert. No distress. Worsening thrombocytopenia. Vanc DC'd 09/29  Dr. Ola Spurr (I.D) excused from the case by patient's husband. 10/02 MRI -multiple infarcts 10/03 tracheal bleeding- transfused platelets 10/03 hospitalist service excused from the case by patient's husband 10/03 Echocardiogram ejection fraction was 55-60%, pulmonary systolic pressure was 39 mmHg 10/08 restart TF's at lower rate, attempt reg HD  10/12 Transferred to med-surg floor. Remains on PCCM service 10/14 SLP  eval: pt unable to tolerate PMV adequately 10/17-will re-attempt PM valve-discussed with Speech therapist 10/18 passed swallow eval-start pureed thick foods no thin liquids-continue NG feeds 10/19 transferred to step down for sepsis/aspiration pneumonia 10/19 cxr shows RLL opacity 10/21 sacral decub debride at bedside by surgery 10/22 started back on vasopressors while on HD 10/27 CT with osteo, R hip fx, unable to identify tip of dubhoff tube - sent for fluoro study  INDWELLING DEVICES:: Trach (chronic) placed June  2014 Tunneled R IJ HD cath (chronic) Tunneled L IJ CVL (chronic) L femoral A-line 9/23 >> 9/25  MICRO DATA: 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 Pneumoniae, P. Mirabilis, VRE Wound 9/26 >> No growth Blood 9/23 >> NEG CDiff 9/27>>neg Stool Cx 10/15>> negative Cdiff 10/25>>neg Trach Aspirate 10/28>> light growth pseudomonas Blood 10/28 >> 1/2 GPC >>   ANTIMICROBIALS:  Aztreonam 9/23 >> 9/24 Vanc 9/23 >> 9/25 Vanc 9/26>>9/27 Daptomycin 9/27>> 10/12, 10/28>>  Meropenem 9/23 >> 10/14, 10/28 >>    SUBJECTIVE:  RASS -1. + F/C. No distress  VITAL SIGNS: Temp:  [98.5 F (36.9 C)-98.9 F (37.2 C)] 98.6 F (37 C) (10/30 1000) Pulse Rate:  [97-114] 107 (10/30 1244) Resp:  [0-28] 23 (10/30 1244) BP: (121-148)/(56-118) 139/67 mmHg (10/30 1200) SpO2:  [96 %-100 %] 100 % (10/30 1244) FiO2 (%):  [28 %] 28 % (10/30 0301) Weight:  [85 kg (187 lb 6.3 oz)] 85 kg (187 lb 6.3 oz) (10/30 0500) HEMODYNAMICS:   VENTILATOR SETTINGS: Vent Mode:  [-]  FiO2 (%):  [28 %] 28 % INTAKE / OUTPUT:  Intake/Output Summary (Last 24 hours) at 09/18/15 1539 Last data filed at 09/18/15 0945  Gross per 24 hour  Intake 448.33 ml  Output      0 ml  Net 448.33 ml    PHYSICAL EXAMINATION: Physical Examination:   VS: BP 139/67 mmHg  Pulse 107  Temp(Src) 98.6 F (37 C) (Oral)  Resp 23  Ht $R'5\' 9"'mz$  (1.753 m)  Wt 85 kg (187 lb 6.3 oz)  BMI 27.66 kg/m2  SpO2 100%  LMP  (LMP Unknown)  General Appearance: No resp distress, coarse upper airway sounds  Neuro: profoundly diffusely weak HEENT: cushingoid facies, PERRLA, EOM intact Neck: trach site clean Pulmonary: clear anteriorly Cardiovascular: reg, + syst murmur Abdomen: soft, NT, +BS Extremities: warm, R>L UE edema    LABORATORY PANEL:    CBC  Recent Labs Lab 09/18/15 0554  WBC 9.9  HGB 7.2*  HCT 23.0*  PLT 205    Chemistries   Recent Labs Lab 09/14/15 1933  09/18/15 0554  NA  --   < > 135  K  --   < > 3.6  CL  --   < > 98*  CO2  --   < > 31  GLUCOSE  --   < > 305*  BUN  --   < > 46*  CREATININE  --   < > 1.30*  CALCIUM  --   < > 7.6*  PHOS 3.1  --   --   AST  --   < > 28  ALT  --   < > 20  ALKPHOS  --   < > 1487*  BILITOT  --   < > 0.6  < > = values in this interval not displayed.   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 Not requiring insulin coverage Chronic steroid therapy P:  Monitor CBGs q 8 hrs - resume SSI if > 180 Cont hydrocortisone @ 10 mg BID  MSK A: Right hip fx - small intertrochanteric R fx - pain  management and ortho eval.    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

## 2015-09-19 LAB — CULTURE, RESPIRATORY W GRAM STAIN

## 2015-09-19 LAB — GLUCOSE, CAPILLARY
GLUCOSE-CAPILLARY: 143 mg/dL — AB (ref 65–99)
GLUCOSE-CAPILLARY: 169 mg/dL — AB (ref 65–99)
GLUCOSE-CAPILLARY: 171 mg/dL — AB (ref 65–99)
Glucose-Capillary: 116 mg/dL — ABNORMAL HIGH (ref 65–99)

## 2015-09-19 LAB — CULTURE, RESPIRATORY

## 2015-09-19 LAB — GENTAMICIN LEVEL, TROUGH: Gentamicin Trough: 2.7 ug/mL (ref 0.5–2.0)

## 2015-09-19 MED ORDER — GENTAMICIN SULFATE 40 MG/ML IJ SOLN
3.0000 mg/kg | Freq: Once | INTRAVENOUS | Status: AC
  Administered 2015-09-19: 200 mg via INTRAVENOUS
  Filled 2015-09-19: qty 5

## 2015-09-19 MED ORDER — ADULT MULTIVITAMIN LIQUID CH
5.0000 mL | Freq: Every day | ORAL | Status: DC
  Administered 2015-09-19 – 2015-11-22 (×65): 5 mL via ORAL
  Filled 2015-09-19 (×71): qty 5

## 2015-09-19 MED ORDER — LINEZOLID 600 MG/300ML IV SOLN
600.0000 mg | Freq: Two times a day (BID) | INTRAVENOUS | Status: DC
  Administered 2015-09-19 – 2015-09-20 (×2): 600 mg via INTRAVENOUS
  Filled 2015-09-19 (×3): qty 300

## 2015-09-19 MED ORDER — DEXTROSE 5 % IV SOLN
2.0000 g | INTRAVENOUS | Status: DC
  Administered 2015-09-19: 2 g via INTRAVENOUS
  Filled 2015-09-19: qty 2

## 2015-09-19 MED ORDER — DEXTROSE 5 % IV SOLN
1.0000 g | Freq: Once | INTRAVENOUS | Status: AC
  Administered 2015-09-19: 1 g via INTRAVENOUS
  Filled 2015-09-19: qty 1

## 2015-09-19 MED ORDER — DEXTROSE 5 % IV SOLN
2.0000 mg/kg | INTRAVENOUS | Status: DC
  Filled 2015-09-19: qty 3.25

## 2015-09-19 MED ORDER — ALBUMIN HUMAN 25 % IV SOLN
12.5000 g | Freq: Once | INTRAVENOUS | Status: AC
  Administered 2015-09-19: 12.5 g via INTRAVENOUS
  Filled 2015-09-19 (×2): qty 50

## 2015-09-19 MED ORDER — FOLIC ACID 1 MG PO TABS
1.0000 mg | ORAL_TABLET | Freq: Every day | ORAL | Status: DC
  Administered 2015-09-19 – 2015-11-11 (×54): 1 mg via ORAL
  Filled 2015-09-19 (×55): qty 1

## 2015-09-19 NOTE — Progress Notes (Signed)
[facility] Progess Note  Name: Courtney [name] MRN: [id number] DOB: 09-20-1966    ADMISSION DATE:  08/10/2015   INTERVAL HISTORY: 10/30: TFs resumed 10/29 KUB: No free air 10/28 Ortho consultation: I do not feel that she is a surgical candidate. Therefore, I feel that it would best to manage this fracture nonsurgically and allow it to heal by itself over time, which it should.  10/28 Gen Surg consultation: Due to the lack of free air or free flow of contrast and the peritoneum there is no indication for any surgical intervention on this. Would recommend pulling back the feeding tube 1-2 cm. Would recheck an abdominal film to confirm no pneumoperitoneum in the morning. Absence of any changes okay to continue using Dobbhoff for feeding and medications. 10/28 gastrograffin study: The study confirms that the feeding tube pes perforated through the duodenum and the tip is within a cavity that fills with injected contrast. The cavity does appear walled off 10/28-patient with mildly depressed mentation this morning, seems a little bit more somnolent than usual. We'll move on an open eyes but not as interactive. Take half a teaspoon of liquids yesterday by mouth with Passy-Muir valve in place. Overnight increasing white cell count 18,000, CT abdomen and pelvis done showed a right hip fracture small, unable to properly locate the metallic tip of the gastric tube, recommended fluoroscopic study. CT also showed osteomyelitis. Plan today is to resume daptomycin and meropenem, stop amantadine due to elevated alkaline phosphatase, orthopedic evaluation for mild/small right hip fracture.  10/27-showing large bowel movement last night, yellow in nature. Today team and decided to get a CT abdomen and pelvis without contrast to evaluate for diverticulitis/colitis/sigmoiditis and overall anatomy of her bowels. Change tube feeds from Jevity to Vita.  Patient tolerating speaking valve,  we'll attempt to feed with the use of a speaking valve while still having them off in place. Tube feedings to stop 10 minutes before attempting by mouth feeds. 0.$RemoveBefore'5mg'[id number]$  Dilaudid PRN with sacral dressing changes only.    10/26- had 3 loose stools overnight, still off vasopressors. Blood pressure stable overnight, no fever, white cell count mildly elevated at 14.2, small ulcer on lateral aspect of right foot and right ankle. Increase tube feeds to 50 mL. HD today  10/25- off vasopressors, prozac started yesterday, BP stable overnight, abx stopped yesterday, seen by PT and speech tx yesterday, mild inc in wbc, recheck labs in the AM. Two loose stools yesterday, cdiff neg.  10/24-off vasopressors, started prozac today, HD this PM and assess BP and vasopressors needs Follow CBC, repeat CXR pending, plan for Speech for small oral feeds, increased TF rate to 40 (goal is 60), abx stopped for now,    10/23- off vasopressors, 1 soft BM overnight, low H/H this AM, husband at bedside.   10/22 - off vasopressors, tolerated slow HD yesterday, tracking with eyes, and will nod head to answer questions. Sacral decub debrided at bedside yesterday. Restarted on levophed 70mcg during dialysis, stopped this AM.   10/21-off vasopressors s/p wound debridement at bedside by gen surgery Morphine given for severe pain, plan for slow HD today and assess BP Continue step down monitoring, plan for TEE next week will discuss with Husband  10/20-transferred to step down for sepsis/aspiration pneumonia, weaned off levophed this AM -abx adjusted, continue TF's, will NOT start oral feeds Plan for TEE at some point, appreciate surgical input, obtain CBC,chem 7, CXR as needed Will update husband today  10/19-patient with  minimal amounts of food this AM, will plan to continue oral intake along with Tube feedings and assess calorie count This was discussed with Husband, plan to stop TF's tomorrow if she tolerates oral feeds and  remove NG tube in next 2 days Will talk and discuss with Husband that patient is very high risk for aspiration-will obtain Surgery consult to reassess decub ulcer Will consult cardiology to assess for TEE  10/18 discussed with husband-patient passed swallow eval spoke with Speech Therapist Will add Pureed thick nectar foods and continue Enteral feeds through NG tube.  Will add Megace   On 10/16  the patient's husband was upset due to placement of a rectal tube by on-call physician, this was removed. It was also noted that the pt developed diarrhea, therefore Jevity was changed from 1.5 with fiber to Jevity 1.2; the rate was decreased to 20 cc/hr and the pt was given a dose of lomotil, which has helped the diarrhea. It was also sent for stool culture and Cdif.   On 10/17 discussed with husband options going forward. He would like to transfer to [facility] as it is closer to him, also  [facility] is there so he can have his wife evaluated better and that he can have the [employer] come and check on her status personally-Dr. Mortimer Fries has discussed case with Hospitalist and they pretty much have refused to accept her at this time.   -He declines transfer of his wife back to [place].   -He would like to repeat speech evaluation and Pasey-Muir valve as he hopes that his wife will be able to eat and then will not need a feeding tube. He understands that she may be "stuck" in the hospital as currently no local LTAC is accepting her. He is continuing discussions with [facility].   -He would like his wife restarted on aspirin due to stroke risk. It was explained that given her life threatening bleed the rounding physician did not feel comfortable restarting this medication at this time, but he could revisit this discussion with other physicians in the future.    INITIAL PRESENTATION:  55 F who has been in medical facilities (hosp, LTAC, rehab) for 2 yrs following gastric bypass surgery with multiple complications. Now with  chronic trach, ESRD, profound debilitation, severe sacral pressure ulcer. Was seemingly making progress and transferred to rehab facility approx one week prior to this admission. She was sent to [facility] ED with AMS and hypotension. Working dx of severe sepsis/septic shock due to infected sacral pressure ulcer. Also has R side externalized HD cath and tunneled L IJ CVL. She has history of resistant bacterial infections. She is on chronic steroids. Her husband indicates that she has been diagnosed with adrenal insuff at some point during the past 2 yrs  MAJOR EVENTS/TEST RESULTS: Admission 02/07/14-05/07/14 Admission 07/21/14-09/06/14 Discharged to [facility]. Pt had palliative consult at that time, were asked to sign off by husband.  09/23 CT head: NAD 09/23 EEG: no epileptiform activity 09/23 PRBCs for Hgb 6.4 09/24 bedside debridement of sacral wound. Abscess drained 09/25 Off vasopressors. More alert. No distress. Worsening thrombocytopenia. Vanc DC'd 09/29  Dr. Ola Spurr (I.D) excused from the case by patient's husband. 10/02 MRI -multiple infarcts 10/03 tracheal bleeding- transfused platelets 10/03 hospitalist service excused from the case by patient's husband 10/03 Echocardiogram ejection fraction was 55-60%, pulmonary systolic pressure was 39 mmHg 10/08 restart TF's at lower rate, attempt reg HD  10/12 Transferred to med-surg floor. Remains on PCCM service 10/14 SLP  eval: pt unable to tolerate PMV adequately 10/17-will re-attempt PM valve-discussed with Speech therapist 10/18 passed swallow eval-start pureed thick foods no thin liquids-continue NG feeds 10/19 transferred to step down for sepsis/aspiration pneumonia 10/19 cxr shows RLL opacity 10/21 sacral decub debride at bedside by surgery 10/22 started back on vasopressors while on HD 10/27 CT with osteo, R hip fx, unable to identify tip of dubhoff tube - sent for fluoro study  INDWELLING DEVICES:: Trach (chronic) placed June  2014 Tunneled R IJ HD cath (chronic) Tunneled L IJ CVL (chronic) L femoral A-line 9/23 >> 9/25  MICRO DATA: 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 Pneumoniae, P. Mirabilis, VRE Wound 9/26 >> No growth Blood 9/23 >> NEG CDiff 9/27>>neg Stool Cx 10/15>> negative Cdiff 10/25>>neg Trach Aspirate 10/28>> light growth pseudomonas Blood 10/28 >> 1/2 GPC >>   ANTIMICROBIALS:  Aztreonam 9/23 >> 9/24 Vanc 9/23 >> 9/25 Vanc 9/26>>9/27 Daptomycin 9/27>> 10/12, 10/28>>  Meropenem 9/23 >> 10/14, 10/28 >>    SUBJECTIVE:  RASS -1. + F/C. No distress  VITAL SIGNS: Temp:  [98.4 F (36.9 C)-98.9 F (37.2 C)] 98.5 F (36.9 C) (10/31 0100) Pulse Rate:  [100-112] 112 (10/31 0600) Resp:  [0-33] 26 (10/31 0600) BP: (127-156)/(51-99) 152/79 mmHg (10/31 0600) SpO2:  [99 %-100 %] 100 % (10/31 0600) FiO2 (%):  [28 %] 28 % (10/31 0340) Weight:  [216 lb 0.8 oz (98 kg)] 216 lb 0.8 oz (98 kg) (10/31 0400) HEMODYNAMICS:   VENTILATOR SETTINGS: Vent Mode:  [-]  FiO2 (%):  [28 %] 28 % INTAKE / OUTPUT:  Intake/Output Summary (Last 24 hours) at 09/19/15 0803 Last data filed at 09/19/15 0700  Gross per 24 hour  Intake 1387.83 ml  Output      0 ml  Net 1387.83 ml    PHYSICAL EXAMINATION: Physical Examination:   VS: BP 152/79 mmHg  Pulse 112  Temp(Src) 98.5 F (36.9 C) (Oral)  Resp 26  Ht $R'5\' 9"'fF$  (1.753 m)  Wt 216 lb 0.8 oz (98 kg)  BMI 31.89 kg/m2  SpO2 100%  LMP  (LMP Unknown)  General Appearance: No resp distress, coarse upper airway sounds  Neuro: profoundly diffusely weak HEENT: cushingoid facies, PERRLA, EOM intact Neck: trach site clean Pulmonary: clear anteriorly Cardiovascular: reg, + syst murmur Abdomen: soft, NT, +BS Extremities: warm, R>L UE edema    LABORATORY PANEL:    CBC  Recent Labs Lab 09/18/15 0554  WBC 9.9  HGB 7.2*  HCT 23.0*  PLT 205    Chemistries   Recent Labs Lab 09/14/15 1933  09/18/15 0554  NA  --   < > 135  K  --   < > 3.6  CL  --   < > 98*  CO2  --   < > 31  GLUCOSE  --   < > 305*  BUN  --   < > 46*  CREATININE  --   < > 1.30*  CALCIUM  --   < > 7.6*  PHOS 3.1  --   --   AST  --   < > 28  ALT  --   < > 20  ALKPHOS  --   < > 1487*  BILITOT  --   < > 0.6  < > = values in this interval not displayed.   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  P:  MAP goal > 55 mmHg Cont hydrocortisone and midodrine per tube  RENAL A:  ESRD  P:  Monitor BMET intermittently Monitor I/Os Correct electrolytes as indicated Intermittent HD per Renal Service (MWF)  GASTROINTESTINAL A:  LGIB, resolved Hx of C. 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 Not requiring insulin coverage Chronic steroid therapy P:  Monitor CBGs q 8 hrs - resume SSI if > 180 Cont hydrocortisone @ 10 mg BID  MSK A: Right hip  fx - small intertrochanteric R fx - pain management and ortho eval.    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

## 2015-09-19 NOTE — Progress Notes (Signed)
ANTIBIOTIC CONSULT NOTE - Follow up  Pharmacy Consult for Cefepime/Gentamicin/Daptomycin Indication: Osteomyelitis, pseudomonas tracheal aspirate  Allergies  Allergen Reactions  . Contrast Media [Iodinated Diagnostic Agents] Anaphylaxis  . Ampicillin Rash    Patient Measurements: Height: 5\' 9"  (175.3 cm) Weight: 216 lb 0.8 oz (98 kg) IBW/kg (Calculated) : 66.2   Vital Signs: Temp: 98.6 F (37 C) (10/31 1200) Temp Source: Axillary (10/31 1200) BP: 150/87 mmHg (10/31 1400) Pulse Rate: 115 (10/31 1400) Intake/Output from previous day: 10/30 0701 - 10/31 0700 In: 1387.8 [I.V.:10; NG/GT:1116.7; IV Piggyback:261.2] Out: -  Intake/Output from this shift: Total I/O In: 50 [NG/GT:50] Out: -   Labs:  Recent Labs  09/18/15 0554  WBC 9.9  HGB 7.2*  PLT 205  CREATININE 1.30*   Estimated Creatinine Clearance: 64.5 mL/min (by C-G formula based on Cr of 1.3).  Recent Labs  09/19/15 0944  GENTTROUGH 2.7*     Microbiology: Recent Results (from the past 720 hour(s))  Stool culture     Status: None   Collection Time: 09/03/15  3:51 PM  Result Value Ref Range Status   Specimen Description STOOL  Final   Special Requests Immunocompromised  Final   Culture   Final    NO SALMONELLA OR SHIGELLA ISOLATED No Pathogenic E. coli detected NO CAMPYLOBACTER DETECTED    Report Status 09/07/2015 FINAL  Final  C difficile quick scan w PCR reflex     Status: None   Collection Time: 09/13/15 12:51 AM  Result Value Ref Range Status   C Diff antigen NEGATIVE NEGATIVE Final   C Diff toxin NEGATIVE NEGATIVE Final   C Diff interpretation Negative for C. difficile  Final  Culture, blood (routine x 2)     Status: None (Preliminary result)   Collection Time: 09/16/15 11:24 AM  Result Value Ref Range Status   Specimen Description BLOOD LEFT HAND  Final   Special Requests BOTTLES DRAWN AEROBIC AND ANAEROBIC  1CC  Final   Culture NO GROWTH 2 DAYS  Final   Report Status PENDING  Incomplete   Culture, blood (routine x 2)     Status: None (Preliminary result)   Collection Time: 09/16/15 12:23 PM  Result Value Ref Range Status   Specimen Description BLOOD RIGHT HAND  Final   Special Requests BOTTLES DRAWN AEROBIC AND ANAEROBIC  1CC  Final   Culture  Setup Time   Final    GRAM POSITIVE COCCI AEROBIC BOTTLE ONLY CRITICAL RESULT CALLED TO, READ BACK BY AND VERIFIED WITH: TESS THOMAS,RN 09/17/2015 0631 BY JRS.    Culture   Final    ENTEROCOCCUS FAECALIS AEROBIC BOTTLE ONLY VRE HAVE INTRINSIC RESISTANCE TO MOST COMMONLY USED ANTIBIOTICS AND THE ABILITY TO ACQUIRE RESISTANCE TO MOST AVAILABLE ANTIBIOTICS. CRITICAL RESULT CALLED TO, READ BACK BY AND VERIFIED WITH: CHERYL SMITH AT 0818 09/19/15 DV    Report Status PENDING  Incomplete   Organism ID, Bacteria ENTEROCOCCUS FAECALIS  Final      Susceptibility   Enterococcus faecalis - MIC*    AMPICILLIN <=2 SENSITIVE Sensitive     LINEZOLID 2 SENSITIVE Sensitive     CIPROFLOXACIN Value in next row Resistant      RESISTANT>=8    TETRACYCLINE Value in next row Resistant      RESISTANT>=16    VANCOMYCIN Value in next row Resistant      RESISTANT>=32    GENTAMICIN SYNERGY Value in next row Resistant      RESISTANT>=32    * ENTEROCOCCUS FAECALIS  Culture, respiratory (NON-Expectorated)     Status: None   Collection Time: 09/16/15  3:50 PM  Result Value Ref Range Status   Specimen Description TRACHEAL ASPIRATE  Final   Special Requests Immunocompromised  Final   Gram Stain   Final    FEW WBC SEEN GOOD SPECIMEN - 80-90% WBCS RARE GRAM NEGATIVE RODS    Culture LIGHT GROWTH PSEUDOMONAS AERUGINOSA  Final   Report Status 09/19/2015 FINAL  Final   Organism ID, Bacteria PSEUDOMONAS AERUGINOSA  Final      Susceptibility   Pseudomonas aeruginosa - MIC*    CEFTAZIDIME 8 SENSITIVE Sensitive     CIPROFLOXACIN 2 INTERMEDIATE Intermediate     GENTAMICIN >=16 RESISTANT Resistant     IMIPENEM >=16 RESISTANT Resistant     PIP/TAZO Value in  next row Sensitive      SENSITIVE32    CEFEPIME Value in next row Sensitive      SENSITIVE8    LEVOFLOXACIN Value in next row Resistant      RESISTANT>=8    * LIGHT GROWTH PSEUDOMONAS AERUGINOSA    Medical History: Past Medical History  Diagnosis Date  . Obesity   . Dyslipidemia   . Hypertension   . Coronary artery disease     s/p BMS 2010 LAD  . Dysrhythmia     ventricular tachycardia resolved after LAD stent and beta blocker  . Diabetes mellitus     with retinopathy, neuropathy and microalbuminemia  . ESRD (end stage renal disease) on dialysis     Medications:  Scheduled:  . albumin human  12.5 g Intravenous Once  . antiseptic oral rinse  7 mL Mouth Rinse q12n4p  . ceFEPime (MAXIPIME) IV  2 g Intravenous Once per day on Mon Wed Fri  . chlorhexidine  15 mL Mouth Rinse BID  . DAPTOmycin (CUBICIN)  IV  6 mg/kg Intravenous Q48H  . epoetin (EPOGEN/PROCRIT) injection  10,000 Units Intravenous Q M,W,F-HD  . feeding supplement (PRO-STAT SUGAR FREE 64)  30 mL Per Tube 6 times per day  . folic acid  1 mg Oral Daily  . gentamicin  2 mg/kg (Ideal) Intravenous Once per day on Mon Wed Fri  . heparin subcutaneous  5,000 Units Subcutaneous Q12H  . hydrocortisone  10 mg Per Tube BID  . lidocaine  1 patch Transdermal Q24H  . midodrine  5 mg Per Tube TID WC  . multivitamin  5 mL Oral Daily  . nystatin   Topical TID  . sodium chloride  10-40 mL Intracatheter Q12H  . sodium hypochlorite   Irrigation BID   Infusions:  . 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   [phone] V[employer] flag made [place] aware of pseudomonas aeruginosa from tracheal aspirate, MDRO resistant to imipenem. Paged on call provider who asked for pharmacy to dose gentamicin (for wound/osteo) and cefepime (for  pseudomonal aspirate and PCN allergy)  Goal of Therapy:  Resolution of Infection  Plan:  1. Continue daptomycin 6 mg/kg q 48 hours.   2. Gentamicin 200 mg IV load given with subsequent level (pre-HD of 2.7). Will continue with gentamicin 130 mg per HD due to pre-HD level < 3. First dose will be after today's HD session. Will check another level after the next HD session to make sure trough remains below 2. Will f/u HD sessions  to make sure patient is receiving 3-4 hours of dialysis with BFR of 300-400 ml/min.   3. Continue cefepime 2 gm IV Q48H after dialysis on dialysis days. Cefepime is on back order for now with resolution expected as early as end of October 2016 for some manufacturers to second quarter 2017 for others. We currently have enough for this patient.   Luisa Hart D, Pharm.D.  Clinical Pharmacist 09/19/2015,3:13 PM

## 2015-09-19 NOTE — Progress Notes (Signed)
Nutrition Follow-up  DOCUMENTATION CODES:   Severe malnutrition in context of acute illness/injury  INTERVENTION:   EN: continue TF as ordered; recommended goal rate is 65 ml/hr. Continue to assess Coordination of Care: discussed possibility of re-advancing diet during ICU rounds; MD Ramchandran ok with diet advancement, pt to only be fed when alert. Writer called SLP with regards to diet advancement. SLP reports that pt can be restarted on diet that was previously ordered. This was discussed with Dione Housekeeperheryl RN. Calorie Count was discontinued by MD over the weekend. Will resume calorie count once diet advanced and once appropriate   NUTRITION DIAGNOSIS:   Inadequate oral intake related to wound healing, acute illness, chronic illness as evidenced by NPO status, estimated needs.  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, ulcer in nose from dobhoff)  Electrolyte and Renal Profile:  Recent Labs Lab 09/14/15 1933 09/16/15 0424 09/16/15 0530 09/18/15 0554  BUN  --  62* 44* 46*  CREATININE  --  1.25* 1.01* 1.30*  NA  --  138 141 135  K  --  3.8 3.6 3.6  PHOS 3.1  --   --   --    Glucose Profile:  Recent Labs  09/18/15 1133 09/19/15 0014 09/19/15 0736  GLUCAP 71 116* 171*   Meds: reviewed  Height:   Ht Readings from Last 1 Encounters:  08/15/2015 5\' 9"  (1.753 m)    Weight:   Wt Readings from Last  1 Encounters:  09/19/15 216 lb 0.8 oz (98 kg)    Filed Weights   09/17/15 0500 09/18/15 0500 09/19/15 0400  Weight: 205 lb 0.4 oz (93 kg) 187 lb 6.3 oz (85 kg) 216 lb 0.8 oz (98 kg)    BMI:  Body mass index is 31.89 kg/(m^2).  Estimated Nutritional Needs:   Kcal:  2175-2535 kcals (BEE 1509, 1.2 AF, 1.2-1.4 IF) or 2490-2905 kcals (30-35 kcals/kg)   Protein:  125-166 g (1.5-2.0 g/kg) but likely closer to 166-208 g (2.0-2.5 g/kg)   Fluid:  1000 mL plus UOP  EDUCATION NEEDS:   No education needs identified at this time  HIGH Care Level  Romelle Starcherate De[place]o Passage MS, RD, LDN [phone] Pager

## 2015-09-19 NOTE — Progress Notes (Signed)
ANTIBIOTIC CONSULT NOTE - Follow up  Pharmacy Consult for Cefepime/Gentamicin/Daptomycin Indication: Osteomyelitis, pseudomonas tracheal aspirate  Allergies  Allergen Reactions  . Contrast Media [Iodinated Diagnostic Agents] Anaphylaxis  . Ampicillin Rash    Patient Measurements: Height: 5\' 9"  (175.3 cm) Weight: 187 lb 6.3 oz (85 kg) IBW/kg (Calculated) : 66.2   Vital Signs: Temp: 98.4 F (36.9 C) (10/30 2000) Temp Source: Oral (10/30 2000) BP: 146/84 mmHg (10/31 0200) Pulse Rate: 104 (10/31 0200) Intake/Output from previous day: 10/30 0701 - 10/31 0700 In: 977.8 [I.V.:10; NG/GT:806.7; IV Piggyback:161.2] Out: -  Intake/Output from this shift: Total I/O In: 380 [NG/GT:380] Out: -   Labs:  Recent Labs  09/16/15 0424 09/16/15 0530 09/18/15 0554  WBC 18.1* 11.9* 9.9  HGB 8.2* 7.5* 7.2*  PLT 236 194 205  CREATININE 1.25* 1.01* 1.30*   Estimated Creatinine Clearance: 60.2 mL/min (by C-G formula based on Cr of 1.3). No results for input(s): VANCOTROUGH, VANCOPEAK, VANCORANDOM, GENTTROUGH, GENTPEAK, GENTRANDOM, TOBRATROUGH, TOBRAPEAK, TOBRARND, AMIKACINPEAK, AMIKACINTROU, AMIKACIN in the last 72 hours.   Microbiology: Recent Results (from the past 720 hour(s))  Stool culture     Status: None   Collection Time: 09/03/15  3:51 PM  Result Value Ref Range Status   Specimen Description STOOL  Final   Special Requests Immunocompromised  Final   Culture   Final    NO SALMONELLA OR SHIGELLA ISOLATED No Pathogenic E. coli detected NO CAMPYLOBACTER DETECTED    Report Status 09/07/2015 FINAL  Final  C difficile quick scan w PCR reflex     Status: None   Collection Time: 09/13/15 12:51 AM  Result Value Ref Range Status   C Diff antigen NEGATIVE NEGATIVE Final   C Diff toxin NEGATIVE NEGATIVE Final   C Diff interpretation Negative for C. difficile  Final  Culture, blood (routine x 2)     Status: None (Preliminary result)   Collection Time: 09/16/15 11:24 AM  Result  Value Ref Range Status   Specimen Description BLOOD LEFT HAND  Final   Special Requests BOTTLES DRAWN AEROBIC AND ANAEROBIC  1CC  Final   Culture NO GROWTH 2 DAYS  Final   Report Status PENDING  Incomplete  Culture, blood (routine x 2)     Status: None (Preliminary result)   Collection Time: 09/16/15 12:23 PM  Result Value Ref Range Status   Specimen Description BLOOD RIGHT HAND  Final   Special Requests BOTTLES DRAWN AEROBIC AND ANAEROBIC  1CC  Final   Culture  Setup Time   Final    GRAM POSITIVE COCCI AEROBIC BOTTLE ONLY CRITICAL RESULT CALLED TO, READ BACK BY AND VERIFIED WITH: TESS THOMAS,RN 09/17/2015 0631 BY JRS.    Culture   Final    GRAM POSITIVE COCCI AEROBIC BOTTLE ONLY IDENTIFICATION TO FOLLOW    Report Status PENDING  Incomplete  Culture, respiratory (NON-Expectorated)     Status: None (Preliminary result)   Collection Time: 09/16/15  3:50 PM  Result Value Ref Range Status   Specimen Description TRACHEAL ASPIRATE  Final   Special Requests Immunocompromised  Final   Gram Stain   Final    FEW WBC SEEN FEW SQUAMOUS EPITHELIAL CELLS PRESENT RARE GRAM NEGATIVE RODS    Culture LIGHT GROWTH PSEUDOMONAS AERUGINOSA  Final   Report Status PENDING  Incomplete   Organism ID, Bacteria PSEUDOMONAS AERUGINOSA  Final      Susceptibility   Pseudomonas aeruginosa - MIC*    CEFTAZIDIME 8 SENSITIVE Sensitive     CIPROFLOXACIN 2  INTERMEDIATE Intermediate     GENTAMICIN >=16 RESISTANT Resistant     IMIPENEM >=16 RESISTANT Resistant     PIP/TAZO Value in next row Sensitive      SENSITIVE32    CEFEPIME Value in next row Sensitive      SENSITIVE8    LEVOFLOXACIN Value in next row Resistant      RESISTANT>=8    * LIGHT GROWTH PSEUDOMONAS AERUGINOSA    Medical History: Past Medical History  Diagnosis Date  . Obesity   . Dyslipidemia   . Hypertension   . Coronary artery disease     s/p BMS 2010 LAD  . Dysrhythmia     ventricular tachycardia resolved after LAD stent and beta  blocker  . Diabetes mellitus     with retinopathy, neuropathy and microalbuminemia  . ESRD (end stage renal disease) on dialysis     Medications:  Scheduled:  . antiseptic oral rinse  7 mL Mouth Rinse q12n4p  . ceFEPime (MAXIPIME) IV  1 g Intravenous Once  . ceFEPime (MAXIPIME) IV  2 g Intravenous Once per day on Mon Wed Fri  . chlorhexidine  15 mL Mouth Rinse BID  . DAPTOmycin (CUBICIN)  IV  6 mg/kg Intravenous Q48H  . epoetin (EPOGEN/PROCRIT) injection  10,000 Units Intravenous Q M,W,F-HD  . feeding supplement (PRO-STAT SUGAR FREE 64)  30 mL Per Tube 6 times per day  . gentamicin  2 mg/kg (Ideal) Intravenous Once per day on Mon Wed Fri  . gentamicin  3 mg/kg (Ideal) Intravenous Once  . heparin subcutaneous  5,000 Units Subcutaneous Q12H  . hydrocortisone  10 mg Per Tube BID  . lidocaine  1 patch Transdermal Q24H  . midodrine  5 mg Per Tube TID WC  . nystatin   Topical TID  . sodium chloride  10-40 mL Intracatheter Q12H  . sodium hypochlorite   Irrigation BID   Infusions:  . 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   [phone] [place] flag made [place] aware of pseudomonas aeruginosa from tracheal aspirate, MDRO resistant to imipenem. Paged on call provider who asked for pharmacy to dose gentamicin (for wound/osteo) and cefepime (for pseudomonal aspirate and PCN allergy)  Goal of Therapy:  Resolution of Infection  Plan:  1. Discontinue meropenem. Get ID consult.   2. Gentamicin 200 mg IV x 1 followed by 130 mg IV Qdialysis after HD if pre-dialysis trough is less than 2 mcg/mL will continue to follow labs/levels.   3. Cefepime 1 gm IV x 1 now and cefepime 2 gm IV Q48H after dialysis on dialysis days. Cefepime is on back order for now with resolution expected as early as end of  October 2016 for some manufacturers to second quarter 2017 for others. We currently have enough for this patient.   Carola Frost, Pharm.D.  Clinical Pharmacist 09/19/2015,2:50 AM

## 2015-09-19 NOTE — Progress Notes (Signed)
[facility] CLINIC INFECTIOUS DISEASE PROGRESS NOTE Date of Admission:  08/02/2015     ID: Courtney [name] is a 49 y.o. female admitted since 9/23 with multiple active problems   Active Problems:   Septic shock (HCC)   Pressure ulcer   Decubitus ulcer   Gastrointestinal bleeding   Protein-calorie malnutrition, severe (HCC)   Dyspnea   Respiratory failure (HCC)   Unresponsive   Cerebrovascular accident (CVA) (HCC)   Aspiration pneumonia (HCC)   Chronic respiratory failure (HCC)   Subjective: Asked to see pt again due to multiple cultures  Reviewed hospital course. She is minimally responsive when I spoke with her.   ROS  Eleven systems are reviewed and negative except per hpi  Medications:  Antibiotics Given (last 72 hours)    Date/Time Action Medication Dose Rate   09/17/15 1344 Given   meropenem (MERREM) 500 mg in sodium chloride 0.9 % 50 mL IVPB 500 mg 100 mL/hr   09/18/15 1417 Given   meropenem (MERREM) 500 mg in sodium chloride 0.9 % 50 mL IVPB 500 mg 100 mL/hr   09/18/15 1800 Given   DAPTOmycin (CUBICIN) 558 mg in sodium chloride 0.9 % IVPB 558 mg 222.3 mL/hr   09/19/15 0340 Given   ceFEPIme (MAXIPIME) 1 g in dextrose 5 % 50 mL IVPB 1 g 100 mL/hr   09/19/15 0340 Given   gentamicin (GARAMYCIN) 200 mg in dextrose 5 % 50 mL IVPB 200 mg 110 mL/hr   09/19/15 1955 Given   ceFEPIme (MAXIPIME) 2 g in dextrose 5 % 50 mL IVPB 2 g 100 mL/hr     . antiseptic oral rinse  7 mL Mouth Rinse q12n4p  . ceFEPime (MAXIPIME) IV  2 g Intravenous Once per day on Mon Wed Fri  . chlorhexidine  15 mL Mouth Rinse BID  . epoetin (EPOGEN/PROCRIT) injection  10,000 Units Intravenous Q M,W,F-HD  . feeding supplement (PRO-STAT SUGAR FREE 64)  30 mL Per Tube 6 times per day  . folic acid  1 mg Oral Daily  . heparin subcutaneous  5,000 Units Subcutaneous Q12H  . hydrocortisone  10 mg Per Tube BID  . lidocaine  1 patch Transdermal Q24H  . linezolid (ZYVOX) IV  600 mg Intravenous Q12H  .  midodrine  5 mg Per Tube TID WC  . multivitamin  5 mL Oral Daily  . nystatin   Topical TID  . sodium chloride  10-40 mL Intracatheter Q12H  . sodium hypochlorite   Irrigation BID    Objective: Vital signs in last 24 hours: Temp:  [98.4 F (36.9 C)-98.6 F (37 C)] 98.5 F (36.9 C) (10/31 1548) Pulse Rate:  [103-123] 108 (10/31 1915) Resp:  [0-32] 17 (10/31 1915) BP: (122-159)/(68-100) 154/82 mmHg (10/31 1915) SpO2:  [96 %-100 %] 97 % (10/31 1600) FiO2 (%):  [28 %] 28 % (10/31 1600) Weight:  [97 kg (213 lb 13.5 oz)-98 kg (216 lb 0.8 oz)] 97 kg (213 lb 13.5 oz) (10/31 1548) General Appearance: No resp distress, coarse upper airway sounds  Neuro: profoundly diffusely weak HEENT: cushingoid facies, PERRLA, EOM intact Neck: trach site clean Pulmonary: clear anteriorly Cardiovascular: reg, + syst murmur Abdomen: soft, NT, +BS Extremities: warm, R>L UE edema  Lab Results  Recent Labs  09/18/15 0554  WBC 9.9  HGB 7.2*  HCT 23.0*  NA 135  K 3.6  CL 98*  CO2 31  BUN 46*  CREATININE 1.30*    Microbiology: Results for orders placed or performed during the  hospital encounter of 08/14/2015  Blood Culture (routine x 2)     Status: None   Collection Time: 08/05/2015  8:51 AM  Result Value Ref Range Status   Specimen Description BLOOD [name]  Final   Special Requests BOTTLES DRAWN AEROBIC AND ANAEROBIC  3CC  Final   Culture NO GROWTH 5 DAYS  Final   Report Status 08/17/2015 FINAL  Final  Blood Culture (routine x 2)     Status: None   Collection Time: 08/04/2015  9:20 AM  Result Value Ref Range Status   Specimen Description BLOOD LEFT ARM  Final   Special Requests BOTTLES DRAWN AEROBIC AND ANAEROBIC  1CC  Final   Culture NO GROWTH 5 DAYS  Final   Report Status 08/17/2015 FINAL  Final  Wound culture     Status: None   Collection Time: 07/29/2015  9:20 AM  Result Value Ref Range Status   Specimen Description DECUBITIS  Final   Special Requests Normal  Final   Gram Stain   Final     FEW WBC SEEN MANY GRAM NEGATIVE RODS RARE GRAM POSITIVE COCCI    Culture   Final    HEAVY GROWTH ESCHERICHIA COLI MODERATE GROWTH ENTEROBACTER AEROGENES PROTEUS MIRABILIS HEAVY GROWTH ENTEROCOCCUS SPECIES VRE HAVE INTRINSIC RESISTANCE TO MOST COMMONLY USED ANTIBIOTICS AND THE ABILITY TO ACQUIRE RESISTANCE TO MOST AVAILABLE ANTIBIOTICS.    Report Status 08/16/2015 FINAL  Final   Organism ID, Bacteria ESCHERICHIA COLI  Final   Organism ID, Bacteria ENTEROBACTER AEROGENES  Final   Organism ID, Bacteria PROTEUS MIRABILIS  Final   Organism ID, Bacteria ENTEROCOCCUS SPECIES  Final      Susceptibility   Enterobacter aerogenes - MIC*    CEFTAZIDIME <=1 SENSITIVE Sensitive     CEFAZOLIN >=64 RESISTANT Resistant     CEFTRIAXONE <=1 SENSITIVE Sensitive     CIPROFLOXACIN <=0.25 SENSITIVE Sensitive     GENTAMICIN <=1 SENSITIVE Sensitive     IMIPENEM 1 SENSITIVE Sensitive     TRIMETH/SULFA <=20 SENSITIVE Sensitive     * MODERATE GROWTH ENTEROBACTER AEROGENES   Escherichia coli - MIC*    AMPICILLIN <=2 SENSITIVE Sensitive     CEFTAZIDIME <=1 SENSITIVE Sensitive     CEFAZOLIN <=4 SENSITIVE Sensitive     CEFTRIAXONE <=1 SENSITIVE Sensitive     CIPROFLOXACIN <=0.25 SENSITIVE Sensitive     GENTAMICIN <=1 SENSITIVE Sensitive     IMIPENEM <=0.25 SENSITIVE Sensitive     TRIMETH/SULFA <=20 SENSITIVE Sensitive     * HEAVY GROWTH ESCHERICHIA COLI   Proteus mirabilis - MIC*    AMPICILLIN >=32 RESISTANT Resistant     CEFTAZIDIME <=1 SENSITIVE Sensitive     CEFAZOLIN 8 SENSITIVE Sensitive     CEFTRIAXONE <=1 SENSITIVE Sensitive     CIPROFLOXACIN <=0.25 SENSITIVE Sensitive     GENTAMICIN <=1 SENSITIVE Sensitive     IMIPENEM 1 SENSITIVE Sensitive     TRIMETH/SULFA <=20 SENSITIVE Sensitive     * PROTEUS MIRABILIS   Enterococcus species - MIC*    AMPICILLIN >=32 RESISTANT Resistant     VANCOMYCIN >=32 RESISTANT Resistant     GENTAMICIN SYNERGY SENSITIVE Sensitive     TETRACYCLINE Value in next row  Resistant      RESISTANT>=16    * HEAVY GROWTH ENTEROCOCCUS SPECIES  MRSA PCR Screening     Status: None   Collection Time: 08/13/2015  2:38 PM  Result Value Ref Range Status   MRSA by PCR NEGATIVE NEGATIVE Final    Comment:  The GeneXpert MRSA Assay (FDA approved for NASAL specimens only), is one component of a comprehensive MRSA colonization surveillance program. It is not intended to diagnose MRSA infection nor to guide or monitor treatment for MRSA infections.   Blood culture (single)     Status: None   Collection Time: 07/25/2015  3:36 PM  Result Value Ref Range Status   Specimen Description BLOOD RIGHT ASSIST CONTROL  Final   Special Requests BOTTLES DRAWN AEROBIC AND ANAEROBIC  Final   Culture NO GROWTH 5 DAYS  Final   Report Status 08/17/2015 FINAL  Final  Wound culture     Status: None   Collection Time: 08/13/15 12:37 PM  Result Value Ref Range Status   Specimen Description WOUND  Final   Special Requests Normal  Final   Gram Stain   Final    FEW WBC SEEN TOO NUMEROUS TO COUNT GRAM NEGATIVE RODS FEW GRAM POSITIVE COCCI    Culture   Final    HEAVY GROWTH ESCHERICHIA COLI MODERATE GROWTH PROTEUS MIRABILIS LIGHT GROWTH KLEBSIELLA PNEUMONIAE MODERATE GROWTH ENTEROCOCCUS GALLINARUM CRITICAL RESULT CALLED TO, READ BACK BY AND VERIFIED WITH: [facility] BORBA AT 1042 08/16/15 DV    Report Status 08/17/2015 FINAL  Final   Organism ID, Bacteria ESCHERICHIA COLI  Final   Organism ID, Bacteria PROTEUS MIRABILIS  Final   Organism ID, Bacteria KLEBSIELLA PNEUMONIAE  Final   Organism ID, Bacteria ENTEROCOCCUS GALLINARUM  Final      Susceptibility   Escherichia coli - MIC*    AMPICILLIN >=32 RESISTANT Resistant     CEFTAZIDIME 4 RESISTANT Resistant     CEFAZOLIN >=64 RESISTANT Resistant     CEFTRIAXONE 16 RESISTANT Resistant     GENTAMICIN 2 SENSITIVE Sensitive     IMIPENEM >=16 RESISTANT Resistant     TRIMETH/SULFA <=20 SENSITIVE Sensitive     Extended ESBL  POSITIVE Resistant     PIP/TAZO Value in next row Resistant      RESISTANT>=128    CIPROFLOXACIN Value in next row Sensitive      SENSITIVE<=0.25    * HEAVY GROWTH ESCHERICHIA COLI   Klebsiella pneumoniae - MIC*    AMPICILLIN Value in next row Resistant      SENSITIVE<=0.25    CEFTAZIDIME Value in next row Resistant      SENSITIVE<=0.25    CEFAZOLIN Value in next row Resistant      SENSITIVE<=0.25    CEFTRIAXONE Value in next row Resistant      SENSITIVE<=0.25    CIPROFLOXACIN Value in next row Resistant      SENSITIVE<=0.25    GENTAMICIN Value in next row Sensitive      SENSITIVE<=0.25    IMIPENEM Value in next row Resistant      SENSITIVE<=0.25    TRIMETH/SULFA Value in next row Resistant      SENSITIVE<=0.25    PIP/TAZO Value in next row Resistant      RESISTANT>=128    * LIGHT GROWTH KLEBSIELLA PNEUMONIAE   Proteus mirabilis - MIC*    AMPICILLIN Value in next row Resistant      RESISTANT>=128    CEFTAZIDIME Value in next row Sensitive      RESISTANT>=128    CEFAZOLIN Value in next row Sensitive      RESISTANT>=128    CEFTRIAXONE Value in next row Sensitive      RESISTANT>=128    CIPROFLOXACIN Value in next row Sensitive      RESISTANT>=128    GENTAMICIN Value in next  row Sensitive      RESISTANT>=128    IMIPENEM Value in next row Sensitive      RESISTANT>=128    TRIMETH/SULFA Value in next row Sensitive      RESISTANT>=128    PIP/TAZO Value in next row Sensitive      SENSITIVE<=4    * MODERATE GROWTH PROTEUS MIRABILIS   Enterococcus gallinarum - MIC*    AMPICILLIN Value in next row Resistant      SENSITIVE<=4    GENTAMICIN SYNERGY Value in next row Sensitive      SENSITIVE<=4    CIPROFLOXACIN Value in next row Resistant      RESISTANT>=8    TETRACYCLINE Value in next row Resistant      RESISTANT>=16    * MODERATE GROWTH ENTEROCOCCUS GALLINARUM  Wound culture     Status: None   Collection Time: 08/15/15  2:53 PM  Result Value Ref Range Status   Specimen  Description WOUND  Final   Special Requests NONE  Final   Gram Stain FEW WBC SEEN NO ORGANISMS SEEN   Final   Culture NO GROWTH 3 DAYS  Final   Report Status 08/18/2015 FINAL  Final  C difficile quick scan w PCR reflex     Status: None   Collection Time: 08/17/15 11:34 AM  Result Value Ref Range Status   C Diff antigen NEGATIVE NEGATIVE Final   C Diff toxin NEGATIVE NEGATIVE Final   C Diff interpretation Negative for C. difficile  Final  Stool culture     Status: None   Collection Time: 09/03/15  3:51 PM  Result Value Ref Range Status   Specimen Description STOOL  Final   Special Requests Immunocompromised  Final   Culture   Final    NO SALMONELLA OR SHIGELLA ISOLATED No Pathogenic E. coli detected NO CAMPYLOBACTER DETECTED    Report Status 09/07/2015 FINAL  Final  C difficile quick scan w PCR reflex     Status: None   Collection Time: 09/13/15 12:51 AM  Result Value Ref Range Status   C Diff antigen NEGATIVE NEGATIVE Final   C Diff toxin NEGATIVE NEGATIVE Final   C Diff interpretation Negative for C. difficile  Final  Culture, blood (routine x 2)     Status: None (Preliminary result)   Collection Time: 09/16/15 11:24 AM  Result Value Ref Range Status   Specimen Description BLOOD LEFT HAND  Final   Special Requests BOTTLES DRAWN AEROBIC AND ANAEROBIC  1CC  Final   Culture NO GROWTH 2 DAYS  Final   Report Status PENDING  Incomplete  Culture, blood (routine x 2)     Status: None (Preliminary result)   Collection Time: 09/16/15 12:23 PM  Result Value Ref Range Status   Specimen Description BLOOD RIGHT HAND  Final   Special Requests BOTTLES DRAWN AEROBIC AND ANAEROBIC  1CC  Final   Culture  Setup Time   Final    GRAM POSITIVE COCCI AEROBIC BOTTLE ONLY CRITICAL RESULT CALLED TO, READ BACK BY AND VERIFIED WITH: TESS THOMAS,RN 09/17/2015 0631 BY JRS.    Culture   Final    ENTEROCOCCUS FAECALIS AEROBIC BOTTLE ONLY VRE HAVE INTRINSIC RESISTANCE TO MOST COMMONLY USED  ANTIBIOTICS AND THE ABILITY TO ACQUIRE RESISTANCE TO MOST AVAILABLE ANTIBIOTICS. CRITICAL RESULT CALLED TO, READ BACK BY AND VERIFIED WITHVerlon Au: CHERYL SMITH AT [id number] 09/19/15 DV    Report Status PENDING  Incomplete   Organism ID, Bacteria ENTEROCOCCUS FAECALIS  Final  Susceptibility   Enterococcus faecalis - MIC*    AMPICILLIN <=2 SENSITIVE Sensitive     LINEZOLID 2 SENSITIVE Sensitive     CIPROFLOXACIN Value in next row Resistant      RESISTANT>=8    TETRACYCLINE Value in next row Resistant      RESISTANT>=16    VANCOMYCIN Value in next row Resistant      RESISTANT>=32    GENTAMICIN SYNERGY Value in next row Resistant      RESISTANT>=32    * ENTEROCOCCUS FAECALIS  Culture, respiratory (NON-Expectorated)     Status: None   Collection Time: 09/16/15  3:50 PM  Result Value Ref Range Status   Specimen Description TRACHEAL ASPIRATE  Final   Special Requests Immunocompromised  Final   Gram Stain   Final    FEW WBC SEEN GOOD SPECIMEN - 80-90% WBCS RARE GRAM NEGATIVE RODS    Culture LIGHT GROWTH PSEUDOMONAS AERUGINOSA  Final   Report Status 09/19/2015 FINAL  Final   Organism ID, Bacteria PSEUDOMONAS AERUGINOSA  Final      Susceptibility   Pseudomonas aeruginosa - MIC*    CEFTAZIDIME 8 SENSITIVE Sensitive     CIPROFLOXACIN 2 INTERMEDIATE Intermediate     GENTAMICIN >=16 RESISTANT Resistant     IMIPENEM >=16 RESISTANT Resistant     PIP/TAZO Value in next row Sensitive      SENSITIVE32    CEFEPIME Value in next row Sensitive      SENSITIVE8    LEVOFLOXACIN Value in next row Resistant      RESISTANT>=8    * LIGHT GROWTH PSEUDOMONAS AERUGINOSA    Studies/Results: Dg Chest Port 1 View  09/18/2015  CLINICAL DATA:  Tracheostomy, on BiPAP, shortness of Breath EXAM: PORTABLE CHEST - 1 VIEW COMPARISON:  09/16/2015 FINDINGS: Tracheostomy, feeding tube, and bilateral IJ central venous catheters are stable in position. No pneumothorax. Heart size upper limits normal for technique. Low lung  volumes with some subsegmental atelectasis versus early interstitial infiltrates in the lung bases. Blunting of lateral costophrenic angles suggesting tiny effusions. IMPRESSION: 1. Stable borderline cardiomegaly. 2. Low lung volumes with mild bibasilar atelectasis. 3.  Support hardware stable in position. Electronically Signed   By: Corlis Leak M.D.   On: 09/18/2015 09:58    Assessment/Plan: Bacteremia - most recently enterococcus faecalis  1/2 bcx  Cont daptomycin for 10 for this indicaiton - will likely need longer - see below Repeat bcx to document clearnace  Trach asp cx with pseudomona- no evidence PNA on cxr, no abundance of trach secretions - would not tailor abx to this organism  Sacral decub with underlying osteomyelitis- cultures have grown multiple drug resistant organisms included below Also has had several bedside debridements. I have discussed with ortho and will see if can do a bone bxp for culture to better direct therapy given the varied pathogens identified so far.  9/23 - VRE, Proteus, E coli and enterobacter  9/24 - E coli, Klebsiella (MDRO), Proteus and enterococcus 9/26 no growth Thank you very much for the consult. Will follow with you.  Curlie Sittner   09/19/2015, 9:18 PM

## 2015-09-19 NOTE — Progress Notes (Signed)
eLink Physician-Brief Progress Note Patient Name: Courtney GrinderHedda McMillian [name] DOB: [birth year] MRN: [id number]   Date of Service  09/19/2015  HPI/Events of Note  Blood cx -enterococcus fecalis S to ampi/ linezolid She is Ampi allergic  eICU Interventions  Start linezolid  dc daptomycin/ gent Consider TEE/ ID consult - defer to primary team     Intervention Category Intermediate Interventions: Diagnostic test evaluation  ALVA,RAKESH V. 09/19/2015, 4:59 PM

## 2015-09-19 NOTE — Progress Notes (Signed)
eLink Physician-Brief Progress Note Patient Name: Courtney GrinderHedda McMillian [name] DOB: 01/10/1966 MRN: [id number]   Date of Service  09/19/2015  HPI/Events of Note  Called by pharmacy. Neither the E. Coli from her wound culture or the Pseudomonas from the tracheal aspirate are sensitive to Meropenem.  Patient is allergic to penicillin. The Pseudomonas is sensitive to Cefepime (which has a 1% penicillin allergy cross reaction) and the E. Coli is sensitive to Gentamicin.   eICU Interventions  Will order:  1. D/C Meropenem. 2. Cefepime dosed per pharmacy consult. 3. Gentamicin dosed per pharmacy consult.  Consider ID consultation in AM.       Intervention Category Major Interventions: Infection - evaluation and management  Aquan Kope Eugene 09/19/2015, 2:18 AM

## 2015-09-19 NOTE — Progress Notes (Signed)
Subjective:  TF @ 50 per hour.  Tracheal aspirate= pseudomonas VRE in blood Patient remains critically ill    Objective:  Vital signs in last 24 hours:  Temp:  [98.4 F (36.9 C)-98.9 F (37.2 C)] 98.4 F (36.9 C) (10/31 0800) Pulse Rate:  [100-118] 118 (10/31 0825) Resp:  [0-33] 23 (10/31 0800) BP: (127-156)/(51-99) 148/82 mmHg (10/31 0800) SpO2:  [98 %-100 %] 100 % (10/31 0800) FiO2 (%):  [28 %] 28 % (10/31 0700) Weight:  [98 kg (216 lb 0.8 oz)] 98 kg (216 lb 0.8 oz) (10/31 0400)  Weight change: 13 kg (28 lb 10.6 oz) Filed Weights   09/17/15 0500 09/18/15 0500 09/19/15 0400  Weight: 93 kg (205 lb 0.4 oz) 85 kg (187 lb 6.3 oz) 98 kg (216 lb 0.8 oz)    Intake/Output: I/O last 3 completed shifts: In: 1672.8 [I.V.:295; NG/GT:1116.7; IV Piggyback:261.2] Out: -    Intake/Output this shift:  Total I/O In: 50 [NG/GT:50] Out: -   Physical Exam: General: No acute distress, critically ill  Head: [place]/AT eyes open, tracks in room, NG in place  Eyes: anicteric  Neck: Trach in place  Lungs:  Scattered rhonchi, normal effort  Heart: S1S2 +tachycardia  Abdomen:  Soft, nontender, BS present  Extremities: Trace LE edema, right upper extremity 1+ edema  Neurologic: Awake, appears depressed  Access:  R IJ permcath.       Basic Metabolic Panel:  Recent Labs Lab 09/14/15 0530 09/14/15 1933 09/16/15 0424 09/16/15 0530 09/18/15 0554  NA 139  --  138 141 135  K 3.9  --  3.8 3.6 3.6  CL 100*  --  101 102 98*  CO2 32  --  31 33* 31  GLUCOSE 154*  --  146* 67 305*  BUN 65*  --  62* 44* 46*  CREATININE 1.41*  --  1.25* 1.01* 1.30*  CALCIUM 7.6*  --  8.0* 7.9* 7.6*  PHOS  --  3.1  --   --   --     Liver Function Tests:  Recent Labs Lab 09/16/15 0424 09/16/15 0530 09/18/15 0554  AST 19 33 28  ALT 13* 20 20  ALKPHOS 1008* 1486* 1487*  BILITOT 0.5 0.8 0.6  PROT 4.4* 4.2* 4.1*  ALBUMIN 1.5* 1.8* 1.6*   No results for input(s): LIPASE, AMYLASE in the last 168  hours. No results for input(s): AMMONIA in the last 168 hours.  CBC:  Recent Labs Lab 09/14/15 0530 09/16/15 0424 09/16/15 0530 09/18/15 0554  WBC 14.2* 18.1* 11.9* 9.9  NEUTROABS  --  15.8* 9.7*  --   HGB 7.8* 8.2* 7.5* 7.2*  HCT 25.2* 25.4* 24.0* 23.0*  MCV 98.3 101.5* 101.8* 102.6*  PLT 192 236 194 205    Cardiac Enzymes: No results for input(s): CKTOTAL, CKMB, CKMBINDEX, TROPONINI in the last 168 hours.  BNP: Invalid input(s): POCBNP  CBG:  Recent Labs Lab 09/18/15 0609 09/18/15 0736 09/18/15 1133 09/19/15 0014 09/19/15 0736  GLUCAP 102* 82 71 116* 171*    Microbiology: Results for orders placed or performed during the hospital encounter of September 08, 2015  Blood Culture (routine x 2)     Status: None   Collection Time: September 08, 2015  8:51 AM  Result Value Ref Range Status   Specimen Description BLOOD [name]  Final   Special Requests BOTTLES DRAWN AEROBIC AND ANAEROBIC  3CC  Final   Culture NO GROWTH 5 DAYS  Final   Report Status 08/17/2015 FINAL  Final  Blood Culture (routine  x 2)     Status: None   Collection Time: 08/15/2015  9:20 AM  Result Value Ref Range Status   Specimen Description BLOOD LEFT ARM  Final   Special Requests BOTTLES DRAWN AEROBIC AND ANAEROBIC  1CC  Final   Culture NO GROWTH 5 DAYS  Final   Report Status 08/17/2015 FINAL  Final  Wound culture     Status: None   Collection Time: 07/23/2015  9:20 AM  Result Value Ref Range Status   Specimen Description DECUBITIS  Final   Special Requests Normal  Final   Gram Stain   Final    FEW WBC SEEN MANY GRAM NEGATIVE RODS RARE GRAM POSITIVE COCCI    Culture   Final    HEAVY GROWTH ESCHERICHIA COLI MODERATE GROWTH ENTEROBACTER AEROGENES PROTEUS MIRABILIS HEAVY GROWTH ENTEROCOCCUS SPECIES VRE HAVE INTRINSIC RESISTANCE TO MOST COMMONLY USED ANTIBIOTICS AND THE ABILITY TO ACQUIRE RESISTANCE TO MOST AVAILABLE ANTIBIOTICS.    Report Status 08/16/2015 FINAL  Final   Organism ID, Bacteria ESCHERICHIA COLI   Final   Organism ID, Bacteria ENTEROBACTER AEROGENES  Final   Organism ID, Bacteria PROTEUS MIRABILIS  Final   Organism ID, Bacteria ENTEROCOCCUS SPECIES  Final      Susceptibility   Enterobacter aerogenes - MIC*    CEFTAZIDIME <=1 SENSITIVE Sensitive     CEFAZOLIN >=64 RESISTANT Resistant     CEFTRIAXONE <=1 SENSITIVE Sensitive     CIPROFLOXACIN <=0.25 SENSITIVE Sensitive     GENTAMICIN <=1 SENSITIVE Sensitive     IMIPENEM 1 SENSITIVE Sensitive     TRIMETH/SULFA <=20 SENSITIVE Sensitive     * MODERATE GROWTH ENTEROBACTER AEROGENES   Escherichia coli - MIC*    AMPICILLIN <=2 SENSITIVE Sensitive     CEFTAZIDIME <=1 SENSITIVE Sensitive     CEFAZOLIN <=4 SENSITIVE Sensitive     CEFTRIAXONE <=1 SENSITIVE Sensitive     CIPROFLOXACIN <=0.25 SENSITIVE Sensitive     GENTAMICIN <=1 SENSITIVE Sensitive     IMIPENEM <=0.25 SENSITIVE Sensitive     TRIMETH/SULFA <=20 SENSITIVE Sensitive     * HEAVY GROWTH ESCHERICHIA COLI   Proteus mirabilis - MIC*    AMPICILLIN >=32 RESISTANT Resistant     CEFTAZIDIME <=1 SENSITIVE Sensitive     CEFAZOLIN 8 SENSITIVE Sensitive     CEFTRIAXONE <=1 SENSITIVE Sensitive     CIPROFLOXACIN <=0.25 SENSITIVE Sensitive     GENTAMICIN <=1 SENSITIVE Sensitive     IMIPENEM 1 SENSITIVE Sensitive     TRIMETH/SULFA <=20 SENSITIVE Sensitive     * PROTEUS MIRABILIS   Enterococcus species - MIC*    AMPICILLIN >=32 RESISTANT Resistant     VANCOMYCIN >=32 RESISTANT Resistant     GENTAMICIN SYNERGY SENSITIVE Sensitive     TETRACYCLINE Value in next row Resistant      RESISTANT>=16    * HEAVY GROWTH ENTEROCOCCUS SPECIES  MRSA PCR Screening     Status: None   Collection Time: 08/14/2015  2:38 PM  Result Value Ref Range Status   MRSA by PCR NEGATIVE NEGATIVE Final    Comment:        The GeneXpert MRSA Assay (FDA approved for NASAL specimens only), is one component of a comprehensive MRSA colonization surveillance program. It is not intended to diagnose MRSA infection  nor to guide or monitor treatment for MRSA infections.   Blood culture (single)     Status: None   Collection Time: 08/08/2015  3:36 PM  Result Value Ref Range Status   Specimen  Description BLOOD RIGHT ASSIST CONTROL  Final   Special Requests BOTTLES DRAWN AEROBIC AND ANAEROBIC 5ML  Final   Culture NO GROWTH 5 DAYS  Final   Report Status 08/17/2015 FINAL  Final  Wound culture     Status: None   Collection Time: 08/13/15 12:37 PM  Result Value Ref Range Status   Specimen Description WOUND  Final   Special Requests Normal  Final   Gram Stain   Final    FEW WBC SEEN TOO NUMEROUS TO COUNT GRAM NEGATIVE RODS FEW GRAM POSITIVE COCCI    Culture   Final    HEAVY GROWTH ESCHERICHIA COLI MODERATE GROWTH PROTEUS MIRABILIS LIGHT GROWTH KLEBSIELLA PNEUMONIAE MODERATE GROWTH ENTEROCOCCUS GALLINARUM CRITICAL RESULT CALLED TO, READ BACK BY AND VERIFIED WITH: [facility] BORBA AT 1042 08/16/15 DV    Report Status 08/17/2015 FINAL  Final   Organism ID, Bacteria ESCHERICHIA COLI  Final   Organism ID, Bacteria PROTEUS MIRABILIS  Final   Organism ID, Bacteria KLEBSIELLA PNEUMONIAE  Final   Organism ID, Bacteria ENTEROCOCCUS GALLINARUM  Final      Susceptibility   Escherichia coli - MIC*    AMPICILLIN >=32 RESISTANT Resistant     CEFTAZIDIME 4 RESISTANT Resistant     CEFAZOLIN >=64 RESISTANT Resistant     CEFTRIAXONE 16 RESISTANT Resistant     GENTAMICIN 2 SENSITIVE Sensitive     IMIPENEM >=16 RESISTANT Resistant     TRIMETH/SULFA <=20 SENSITIVE Sensitive     Extended ESBL POSITIVE Resistant     PIP/TAZO Value in next row Resistant      RESISTANT>=128    CIPROFLOXACIN Value in next row Sensitive      SENSITIVE<=0.25    * HEAVY GROWTH ESCHERICHIA COLI   Klebsiella pneumoniae - MIC*    AMPICILLIN Value in next row Resistant      SENSITIVE<=0.25    CEFTAZIDIME Value in next row Resistant      SENSITIVE<=0.25    CEFAZOLIN Value in next row Resistant      SENSITIVE<=0.25    CEFTRIAXONE Value in  next row Resistant      SENSITIVE<=0.25    CIPROFLOXACIN Value in next row Resistant      SENSITIVE<=0.25    GENTAMICIN Value in next row Sensitive      SENSITIVE<=0.25    IMIPENEM Value in next row Resistant      SENSITIVE<=0.25    TRIMETH/SULFA Value in next row Resistant      SENSITIVE<=0.25    PIP/TAZO Value in next row Resistant      RESISTANT>=128    * LIGHT GROWTH KLEBSIELLA PNEUMONIAE   Proteus mirabilis - MIC*    AMPICILLIN Value in next row Resistant      RESISTANT>=128    CEFTAZIDIME Value in next row Sensitive      RESISTANT>=128    CEFAZOLIN Value in next row Sensitive      RESISTANT>=128    CEFTRIAXONE Value in next row Sensitive      RESISTANT>=128    CIPROFLOXACIN Value in next row Sensitive      RESISTANT>=128    GENTAMICIN Value in next row Sensitive      RESISTANT>=128    IMIPENEM Value in next row Sensitive      RESISTANT>=128    TRIMETH/SULFA Value in next row Sensitive      RESISTANT>=128    PIP/TAZO Value in next row Sensitive      SENSITIVE<=4    * MODERATE GROWTH PROTEUS MIRABILIS   Enterococcus gallinarum -  MIC*    AMPICILLIN Value in next row Resistant      SENSITIVE<=4    GENTAMICIN SYNERGY Value in next row Sensitive      SENSITIVE<=4    CIPROFLOXACIN Value in next row Resistant      RESISTANT>=8    TETRACYCLINE Value in next row Resistant      RESISTANT>=16    * MODERATE GROWTH ENTEROCOCCUS GALLINARUM  Wound culture     Status: None   Collection Time: 08/15/15  2:53 PM  Result Value Ref Range Status   Specimen Description WOUND  Final   Special Requests NONE  Final   Gram Stain FEW WBC SEEN NO ORGANISMS SEEN   Final   Culture NO GROWTH 3 DAYS  Final   Report Status 08/18/2015 FINAL  Final  C difficile quick scan w PCR reflex     Status: None   Collection Time: 08/17/15 11:34 AM  Result Value Ref Range Status   C Diff antigen NEGATIVE NEGATIVE Final   C Diff toxin NEGATIVE NEGATIVE Final   C Diff interpretation Negative for C.  difficile  Final  Stool culture     Status: None   Collection Time: 09/03/15  3:51 PM  Result Value Ref Range Status   Specimen Description STOOL  Final   Special Requests Immunocompromised  Final   Culture   Final    NO SALMONELLA OR SHIGELLA ISOLATED No Pathogenic E. coli detected NO CAMPYLOBACTER DETECTED    Report Status 09/07/2015 FINAL  Final  C difficile quick scan w PCR reflex     Status: None   Collection Time: 09/13/15 12:51 AM  Result Value Ref Range Status   C Diff antigen NEGATIVE NEGATIVE Final   C Diff toxin NEGATIVE NEGATIVE Final   C Diff interpretation Negative for C. difficile  Final  Culture, blood (routine x 2)     Status: None (Preliminary result)   Collection Time: 09/16/15 11:24 AM  Result Value Ref Range Status   Specimen Description BLOOD LEFT HAND  Final   Special Requests BOTTLES DRAWN AEROBIC AND ANAEROBIC  1CC  Final   Culture NO GROWTH 2 DAYS  Final   Report Status PENDING  Incomplete  Culture, blood (routine x 2)     Status: None (Preliminary result)   Collection Time: 09/16/15 12:23 PM  Result Value Ref Range Status   Specimen Description BLOOD RIGHT HAND  Final   Special Requests BOTTLES DRAWN AEROBIC AND ANAEROBIC  1CC  Final   Culture  Setup Time   Final    GRAM POSITIVE COCCI AEROBIC BOTTLE ONLY CRITICAL RESULT CALLED TO, READ BACK BY AND VERIFIED WITH: TESS THOMAS,RN 09/17/2015 0631 BY JRS.    Culture   Final    ENTEROCOCCUS FAECALIS AEROBIC BOTTLE ONLY VRE HAVE INTRINSIC RESISTANCE TO MOST COMMONLY USED ANTIBIOTICS AND THE ABILITY TO ACQUIRE RESISTANCE TO MOST AVAILABLE ANTIBIOTICS. CRITICAL RESULT CALLED TO, READ BACK BY AND VERIFIED WITH: CHERYL SMITH AT 0818 09/19/15 DV    Report Status PENDING  Incomplete   Organism ID, Bacteria ENTEROCOCCUS FAECALIS  Final      Susceptibility   Enterococcus faecalis - MIC*    AMPICILLIN <=2 SENSITIVE Sensitive     LINEZOLID 2 SENSITIVE Sensitive     CIPROFLOXACIN Value in next row Resistant       RESISTANT>=8    TETRACYCLINE Value in next row Resistant      RESISTANT>=16    VANCOMYCIN Value in next row Resistant  RESISTANT>=32    GENTAMICIN SYNERGY Value in next row Resistant      RESISTANT>=32    * ENTEROCOCCUS FAECALIS  Culture, respiratory (NON-Expectorated)     Status: None (Preliminary result)   Collection Time: 09/16/15  3:50 PM  Result Value Ref Range Status   Specimen Description TRACHEAL ASPIRATE  Final   Special Requests Immunocompromised  Final   Gram Stain   Final    FEW WBC SEEN FEW SQUAMOUS EPITHELIAL CELLS PRESENT RARE GRAM NEGATIVE RODS    Culture LIGHT GROWTH PSEUDOMONAS AERUGINOSA  Final   Report Status PENDING  Incomplete   Organism ID, Bacteria PSEUDOMONAS AERUGINOSA  Final      Susceptibility   Pseudomonas aeruginosa - MIC*    CEFTAZIDIME 8 SENSITIVE Sensitive     CIPROFLOXACIN 2 INTERMEDIATE Intermediate     GENTAMICIN >=16 RESISTANT Resistant     IMIPENEM >=16 RESISTANT Resistant     PIP/TAZO Value in next row Sensitive      SENSITIVE32    CEFEPIME Value in next row Sensitive      SENSITIVE8    LEVOFLOXACIN Value in next row Resistant      RESISTANT>=8    * LIGHT GROWTH PSEUDOMONAS AERUGINOSA    Coagulation Studies: No results for input(s): LABPROT, INR in the last 72 hours.  Urinalysis: No results for input(s): COLORURINE, LABSPEC, PHURINE, GLUCOSEU, HGBUR, BILIRUBINUR, KETONESUR, PROTEINUR, UROBILINOGEN, NITRITE, LEUKOCYTESUR in the last 72 hours.  Invalid input(s): APPERANCEUR    Imaging: Dg Abd 1 View  09/17/2015  CLINICAL DATA:  Gastric perforation, evaluate for free air EXAM: ABDOMEN - 1 VIEW COMPARISON:  Abdominal radiograph dated 09/08/2015. CT abdomen pelvis dated 09/08/2015. FINDINGS: Nonobstructive bowel gas pattern. Weighted feeding tube terminates over the right lateral abdomen, in an extraluminal location when correlating with recent prior studies. No evidence of free air under the diaphragm on the upright view.  Notably, there is no free air on recent CT, only a small amount of localized fluid/gas within the anterior abdominal wall (the likely site where the weighted feeding tube terminates). IMPRESSION: No free air is seen. Electronically Signed   By: Charline Bills M.D.   On: 09/17/2015 13:02   Dg Chest Port 1 View  09/18/2015  CLINICAL DATA:  Tracheostomy, on BiPAP, shortness of Breath EXAM: PORTABLE CHEST - 1 VIEW COMPARISON:  09/16/2015 FINDINGS: Tracheostomy, feeding tube, and bilateral IJ central venous catheters are stable in position. No pneumothorax. Heart size upper limits normal for technique. Low lung volumes with some subsegmental atelectasis versus early interstitial infiltrates in the lung bases. Blunting of lateral costophrenic angles suggesting tiny effusions. IMPRESSION: 1. Stable borderline cardiomegaly. 2. Low lung volumes with mild bibasilar atelectasis. 3.  Support hardware stable in position. Electronically Signed   By: Corlis Leak M.D.   On: 09/18/2015 09:58     Medications:   . feeding supplement (VITAL AF 1.2 CAL) 1,000 mL (09/18/15 1210)   . antiseptic oral rinse  7 mL Mouth Rinse q12n4p  . ceFEPime (MAXIPIME) IV  2 g Intravenous Once per day on Mon Wed Fri  . chlorhexidine  15 mL Mouth Rinse BID  . DAPTOmycin (CUBICIN)  IV  6 mg/kg Intravenous Q48H  . epoetin (EPOGEN/PROCRIT) injection  10,000 Units Intravenous Q M,W,F-HD  . feeding supplement (PRO-STAT SUGAR FREE 64)  30 mL Per Tube 6 times per day  . folic acid  1 mg Oral Daily  . gentamicin  2 mg/kg (Ideal) Intravenous Once per day on Mon Wed Fri  .  heparin subcutaneous  5,000 Units Subcutaneous Q12H  . hydrocortisone  10 mg Per Tube BID  . lidocaine  1 patch Transdermal Q24H  . midodrine  5 mg Per Tube TID WC  . multivitamin  5 mL Oral Daily  . nystatin   Topical TID  . sodium chloride  10-40 mL Intracatheter Q12H  . sodium hypochlorite   Irrigation BID   sodium chloride, sodium chloride, acetaminophen  (TYLENOL) oral liquid 160 mg/5 mL, HYDROmorphone (DILAUDID) injection, ipratropium-albuterol, morphine injection, oxyCODONE, sodium chloride  Assessment/ Plan:  49 y.o. 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 Admitted on 08/18/2015  1. End-stage renal disease on hemodialysis on HD MWF. The patient has been on dialysis since October of 2014. R IJ permcath. Hemodialysis Friday.   Hypotension chronically and with treatments.  - Plan on continuing hemodialysis on MWF schedule.  - midodine and albumin with treatment. This was given daily.  - We have also used sodium modeling with treatments.  - Dialysis today  2. Anemia of CKD: pt continues to require periodic blood transfusions, hemoglobin of 7.2 yesterday.  - epo has not been as effective as hoped secondary due to chronic inflammation from decubitus ulcer/infection   continue epogen with HD. This has been ordered as a standing order.   3. Secondary hyperparathyroidism: PTH low at 55 - phos was acceptable at 3.1 - no binders  4. ID: VRE in blood Pseudomonas in tracheal aspirate   LOS: 38 Zaion Hreha 10/31/20169:05 AM

## 2015-09-20 LAB — BASIC METABOLIC PANEL
Anion gap: 6 (ref 5–15)
BUN: 37 mg/dL — AB (ref 6–20)
CHLORIDE: 101 mmol/L (ref 101–111)
CO2: 32 mmol/L (ref 22–32)
Calcium: 7.7 mg/dL — ABNORMAL LOW (ref 8.9–10.3)
Creatinine, Ser: 1.02 mg/dL — ABNORMAL HIGH (ref 0.44–1.00)
GFR calc Af Amer: >60 mL/min (ref >60)
GFR calc non Af Amer: >60 mL/min (ref >60)
GLUCOSE: 164 mg/dL — AB (ref 65–99)
POTASSIUM: 3.6 mmol/L (ref 3.5–5.1)
Sodium: 139 mmol/L (ref 135–145)

## 2015-09-20 LAB — CBC
HCT: 23.4 % — ABNORMAL LOW (ref 35.0–47.0)
Hemoglobin: 7.2 g/dL — ABNORMAL LOW (ref 12.0–16.0)
MCH: 31.9 pg (ref 26.0–34.0)
MCHC: 30.7 g/dL — ABNORMAL LOW (ref 32.0–36.0)
MCV: 103.8 fL — ABNORMAL HIGH (ref 80.0–100.0)
Platelets: 194 10*3/uL (ref 150–440)
RBC: 2.25 MIL/uL — ABNORMAL LOW (ref 3.80–5.20)
RDW: 22.8 % — ABNORMAL HIGH (ref 11.5–14.5)
WBC: 9.5 10*3/uL (ref 3.6–11.0)

## 2015-09-20 LAB — GLUCOSE, CAPILLARY
Glucose-Capillary: 131 mg/dL — ABNORMAL HIGH (ref 65–99)
Glucose-Capillary: 129 mg/dL — ABNORMAL HIGH (ref 65–99)

## 2015-09-20 MED ORDER — DAPTOMYCIN 500 MG IV SOLR
560.0000 mg | INTRAVENOUS | Status: DC
  Administered 2015-09-20: 560 mg via INTRAVENOUS
  Filled 2015-09-20: qty 11.2

## 2015-09-20 NOTE — Progress Notes (Signed)
ANTIBIOTIC CONSULT NOTE - INITIAL  Pharmacy Consult for daptomycin  Indication: Bacteremia   Allergies  Allergen Reactions  . Contrast Media [Iodinated Diagnostic Agents] Anaphylaxis  . Ampicillin Rash    Patient Measurements: Height: 5\' 9"  (175.3 cm) Weight: 213 lb 13.5 oz (97 kg) IBW/kg (Calculated) : 66.2  Vital Signs: Temp: 98.7 F (37.1 C) (11/01 0747) Temp Source: Axillary (11/01 0747) BP: 164/78 mmHg (11/01 1100) Pulse Rate: 126 (11/01 1100) Intake/Output from previous day: 10/31 0701 - 11/01 0700 In: 1550 [NG/GT:1200; IV Piggyback:350] Out: 0  Intake/Output from this shift: Total I/O In: 529.2 [NG/GT:229.2; IV Piggyback:300] Out: -   Labs:  Recent Labs  09/18/15 0554 09/20/15 0436  WBC 9.9 9.5  HGB 7.2* 7.2*  PLT 205 194  CREATININE 1.30* 1.02*   Estimated Creatinine Clearance: 81.8 mL/min (by C-G formula based on Cr of 1.02).  Recent Labs  09/19/15 0944  GENTTROUGH 2.7*     Microbiology: Recent Results (from the past 720 hour(s))  Stool culture     Status: None   Collection Time: 09/03/15  3:51 PM  Result Value Ref Range Status   Specimen Description STOOL  Final   Special Requests Immunocompromised  Final   Culture   Final    NO SALMONELLA OR SHIGELLA ISOLATED No Pathogenic E. coli detected NO CAMPYLOBACTER DETECTED    Report Status 09/07/2015 FINAL  Final  C difficile quick scan w PCR reflex     Status: None   Collection Time: 09/13/15 12:51 AM  Result Value Ref Range Status   C Diff antigen NEGATIVE NEGATIVE Final   C Diff toxin NEGATIVE NEGATIVE Final   C Diff interpretation Negative for C. difficile  Final  Culture, blood (routine x 2)     Status: None (Preliminary result)   Collection Time: 09/16/15 11:24 AM  Result Value Ref Range Status   Specimen Description BLOOD LEFT HAND  Final   Special Requests BOTTLES DRAWN AEROBIC AND ANAEROBIC  1CC  Final   Culture NO GROWTH 4 DAYS  Final   Report Status PENDING  Incomplete   Culture, blood (routine x 2)     Status: None (Preliminary result)   Collection Time: 09/16/15 12:23 PM  Result Value Ref Range Status   Specimen Description BLOOD RIGHT HAND  Final   Special Requests BOTTLES DRAWN AEROBIC AND ANAEROBIC  1CC  Final   Culture  Setup Time   Final    GRAM POSITIVE COCCI AEROBIC BOTTLE ONLY CRITICAL RESULT CALLED TO, READ BACK BY AND VERIFIED WITH: TESS THOMAS,RN 09/17/2015 0631 BY JRS.    Culture   Final    ENTEROCOCCUS FAECALIS AEROBIC BOTTLE ONLY VRE HAVE INTRINSIC RESISTANCE TO MOST COMMONLY USED ANTIBIOTICS AND THE ABILITY TO ACQUIRE RESISTANCE TO MOST AVAILABLE ANTIBIOTICS. CRITICAL RESULT CALLED TO, READ BACK BY AND VERIFIED WITH: CHERYL SMITH AT 0818 09/19/15 DV    Report Status PENDING  Incomplete   Organism ID, Bacteria ENTEROCOCCUS FAECALIS  Final      Susceptibility   Enterococcus faecalis - MIC*    AMPICILLIN <=2 SENSITIVE Sensitive     LINEZOLID 2 SENSITIVE Sensitive     CIPROFLOXACIN Value in next row Resistant      RESISTANT>=8    TETRACYCLINE Value in next row Resistant      RESISTANT>=16    VANCOMYCIN Value in next row Resistant      RESISTANT>=32    GENTAMICIN SYNERGY Value in next row Resistant      RESISTANT>=32    *  ENTEROCOCCUS FAECALIS  Culture, respiratory (NON-Expectorated)     Status: None   Collection Time: 09/16/15  3:50 PM  Result Value Ref Range Status   Specimen Description TRACHEAL ASPIRATE  Final   Special Requests Immunocompromised  Final   Gram Stain   Final    FEW WBC SEEN GOOD SPECIMEN - 80-90% WBCS RARE GRAM NEGATIVE RODS    Culture LIGHT GROWTH PSEUDOMONAS AERUGINOSA  Final   Report Status 09/19/2015 FINAL  Final   Organism ID, Bacteria PSEUDOMONAS AERUGINOSA  Final      Susceptibility   Pseudomonas aeruginosa - MIC*    CEFTAZIDIME 8 SENSITIVE Sensitive     CIPROFLOXACIN 2 INTERMEDIATE Intermediate     GENTAMICIN >=16 RESISTANT Resistant     IMIPENEM >=16 RESISTANT Resistant     PIP/TAZO Value in  next row Sensitive      SENSITIVE32    CEFEPIME Value in next row Sensitive      SENSITIVE8    LEVOFLOXACIN Value in next row Resistant      RESISTANT>=8    * LIGHT GROWTH PSEUDOMONAS AERUGINOSA    Medical History: Past Medical History  Diagnosis Date  . Obesity   . Dyslipidemia   . Hypertension   . Coronary artery disease     s/p BMS 2010 LAD  . Dysrhythmia     ventricular tachycardia resolved after LAD stent and beta blocker  . Diabetes mellitus     with retinopathy, neuropathy and microalbuminemia  . ESRD (end stage renal disease) on dialysis     Assessment: 49 yo female admitted on September 04, 2015 with urosepsis and  infected sacral decubitus ulcer. Currently patient is being treated for 1 of 2 positive blood cultures growing VRE. Dr. Sampson Goon requested daptomycin be initiated on patient.  She is an MWF HD patient.  Plan:  Will restart daptomycin /kg q 48 hours. Pharmacy will continue to monitor labs and renal function and make adjustments as needed.   Cher Nakai, PharmD Pharmacy Resident

## 2015-09-20 NOTE — Progress Notes (Signed)
In to feed patient. PMV placed. I informed patient what she has on her tray and asked her if she is ready to eat. She nodded her head yes. I offered her 1/2 tsp of potatos with gravy and she closed her lips tightly. She would not open her mouth and she turned her head away from me. I offered her all foods on her tray and she kept turning her head away from me. I then offered her juice or milk and she shook her head no. I told her we will try again at dinner. PMV removed.

## 2015-09-20 NOTE — Progress Notes (Signed)
Patient's husband at the bedside for dinner. PMV placed. I told patient what dinner consists of and asked if she is ready to eat and she shakes her head from side to side. I offered her the Magic Cup and she would not open her mouth. Her husband kept asking her to eat and she shook her back and forth and held her mouth tight and would not open. Her husband attempted to feed her unsuccessfully. She did take 1/2 tsp potatoes and 2-1/4 spoons of nectar thick apple juice.

## 2015-09-20 NOTE — Progress Notes (Signed)
PT Cancellation Note  Patient Details Name: Courtney GrinderHedda McMillian Monroe MRN: [id number] DOB: 11-19-1966   Cancelled Treatment:    Reason Eval/Treat Not Completed: Medical issues which prohibited therapy (Treatment session attempted.  Patient currently with nursing for dressing change, medications and hygiene; will re-attempt at later time time date as patient available and medically appropriate.)   Shlonda Dolloff H. Manson PasseyBrown, PT, DPT, NCS 09/20/2015, 10:53 AM [phone]

## 2015-09-20 NOTE — Progress Notes (Signed)
Dr. [name] assessed patient's wound. Dressing changed per order by RN. PRN pain medication given prior to wound care.

## 2015-09-20 NOTE — Progress Notes (Addendum)
[facility] Progess Note  Name: Courtney [name] MRN: [id number] DOB: 05/24/1966    ADMISSION DATE:  07/31/2015   INTERVAL HISTORY: 10/31: ID reconsulted, agreed to follow, recommended sacral biopsy.  10/30: TFs resumed 10/29 KUB: No free air 10/28 Ortho consultation: I do not feel that she is a surgical candidate. Therefore, I feel that it would best to manage this fracture nonsurgically and allow it to heal by itself over time, which it should.  10/28 Gen Surg consultation: Due to the lack of free air or free flow of contrast and the peritoneum there is no indication for any surgical intervention on this. Would recommend pulling back the feeding tube 1-2 cm. Would recheck an abdominal film to confirm no pneumoperitoneum in the morning. Absence of any changes okay to continue using Dobbhoff for feeding and medications. 10/28 gastrograffin study: The study confirms that the feeding tube pes perforated through the duodenum and the tip is within a cavity that fills with injected contrast. The cavity does appear walled off 10/28-patient with mildly depressed mentation this morning, seems a little bit more somnolent than usual. We'll move on an open eyes but not as interactive. Take half a teaspoon of liquids yesterday by mouth with Passy-Muir valve in place. Overnight increasing white cell count 18,000, CT abdomen and pelvis done showed a right hip fracture small, unable to properly locate the metallic tip of the gastric tube, recommended fluoroscopic study. CT also showed osteomyelitis. Plan today is to resume daptomycin and meropenem, stop amantadine due to elevated alkaline phosphatase, orthopedic evaluation for mild/small right hip fracture.  10/27-showing large bowel movement last night, yellow in nature. Today team and decided to get a CT abdomen and pelvis without contrast to evaluate for diverticulitis/colitis/sigmoiditis and overall anatomy of her bowels. Change  tube feeds from Jevity to Vita.  Patient tolerating speaking valve, we'll attempt to feed with the use of a speaking valve while still having them off in place. Tube feedings to stop 10 minutes before attempting by mouth feeds. 0.$RemoveBefore'5mg'[id number]$  Dilaudid PRN with sacral dressing changes only.    10/26- had 3 loose stools overnight, still off vasopressors. Blood pressure stable overnight, no fever, white cell count mildly elevated at 14.2, small ulcer on lateral aspect of right foot and right ankle. Increase tube feeds to 50 mL. HD today  10/25- off vasopressors, prozac started yesterday, BP stable overnight, abx stopped yesterday, seen by PT and speech tx yesterday, mild inc in wbc, recheck labs in the AM. Two loose stools yesterday, cdiff neg.  10/24-off vasopressors, started prozac today, HD this PM and assess BP and vasopressors needs Follow CBC, repeat CXR pending, plan for Speech for small oral feeds, increased TF rate to 40 (goal is 60), abx stopped for now,    10/23- off vasopressors, 1 soft BM overnight, low H/H this AM, husband at bedside.   10/22 - off vasopressors, tolerated slow HD yesterday, tracking with eyes, and will nod head to answer questions. Sacral decub debrided at bedside yesterday. Restarted on levophed 43mcg during dialysis, stopped this AM.   10/21-off vasopressors s/p wound debridement at bedside by gen surgery Morphine given for severe pain, plan for slow HD today and assess BP Continue step down monitoring, plan for TEE next week will discuss with Husband  10/20-transferred to step down for sepsis/aspiration pneumonia, weaned off levophed this AM -abx adjusted, continue TF's, will NOT start oral feeds Plan for TEE at some point, appreciate surgical input, obtain CBC,chem 7,  CXR as needed Will update husband today  10/19-patient with minimal amounts of food this AM, will plan to continue oral intake along with Tube feedings and assess calorie count This was discussed with  Husband, plan to stop TF's tomorrow if she tolerates oral feeds and remove NG tube in next 2 days Will talk and discuss with Husband that patient is very high risk for aspiration-will obtain Surgery consult to reassess decub ulcer Will consult cardiology to assess for TEE  10/18 discussed with husband-patient passed swallow eval spoke with Speech Therapist Will add Pureed thick nectar foods and continue Enteral feeds through NG tube.  Will add Megace   On 10/16  the patient's husband was upset due to placement of a rectal tube by on-call physician, this was removed. It was also noted that the pt developed diarrhea, therefore Jevity was changed from 1.5 with fiber to Jevity 1.2; the rate was decreased to 20 cc/hr and the pt was given a dose of lomotil, which has helped the diarrhea. It was also sent for stool culture and Cdif.   On 10/17 discussed with husband options going forward. He would like to transfer to [facility] as it is closer to him, also  [facility] is there so he can have his wife evaluated better and that he can have the [facility] come and check on her status personally-Dr. Mortimer Fries has discussed case with Hospitalist and they pretty much have refused to accept her at this time.   -He declines transfer of his wife back to [place].   -He would like to repeat speech evaluation and Pasey-Muir valve as he hopes that his wife will be able to eat and then will not need a feeding tube. He understands that she may be "stuck" in the hospital as currently no local LTAC is accepting her. He is continuing discussions with [facility].   -He would like his wife restarted on aspirin due to stroke risk. It was explained that given her life threatening bleed the rounding physician did not feel comfortable restarting this medication at this time, but he could revisit this discussion with other physicians in the future.    INITIAL PRESENTATION:  1 F who has been in medical facilities (hosp, LTAC, rehab) for 2 yrs  following gastric bypass surgery with multiple complications. Now with chronic trach, ESRD, profound debilitation, severe sacral pressure ulcer. Was seemingly making progress and transferred to rehab facility approx one week prior to this admission. She was sent to [facility] ED with AMS and hypotension. Working dx of severe sepsis/septic shock due to infected sacral pressure ulcer. Also has R side externalized HD cath and tunneled L IJ CVL. She has history of resistant bacterial infections. She is on chronic steroids. Her husband indicates that she has been diagnosed with adrenal insuff at some point during the past 2 yrs  MAJOR EVENTS/TEST RESULTS: Admission 02/07/14-05/07/14 Admission 07/21/14-09/06/14 Discharged to [facility]. Pt had palliative consult at that time, were asked to sign off by husband.  09/23 CT head: NAD 09/23 EEG: no epileptiform activity 09/23 PRBCs for Hgb 6.4 09/24 bedside debridement of sacral wound. Abscess drained 09/25 Off vasopressors. More alert. No distress. Worsening thrombocytopenia. Vanc DC'd 09/29  Dr. Ola Spurr (I.D) excused from the case by patient's husband. 10/02 MRI -multiple infarcts 10/03 tracheal bleeding- transfused platelets 10/03 hospitalist service excused from the case by patient's husband 10/03 Echocardiogram ejection fraction was 55-60%, pulmonary systolic pressure was 39 mmHg 10/08 restart TF's at lower rate, attempt reg HD  10/12  Transferred to med-surg floor. Remains on PCCM service 10/14 SLP eval: pt unable to tolerate PMV adequately 10/17-will re-attempt PM valve-discussed with Speech therapist 10/18 passed swallow eval-start pureed thick foods no thin liquids-continue NG feeds 10/19 transferred to step down for sepsis/aspiration pneumonia 10/19 cxr shows RLL opacity 10/21 sacral decub debride at bedside by surgery 10/22 started back on vasopressors while on HD 10/27 CT with osteo, R hip fx, unable to identify tip of dubhoff tube - sent for fluoro  study  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 Pneumoniae, P. Mirabilis, VRE Wound 9/26 >> No growth Blood 9/23 >> NEG CDiff 9/27>>neg Stool Cx 10/15>> negative Cdiff 10/25>>neg Trach Aspirate 10/28>> light growth pseudomonas Blood 10/28 >> 1/2 GPC >>   ANTIMICROBIALS:  Aztreonam 9/23 >> 9/24 Vanc 9/23 >> 9/25 Vanc 9/26>>9/27 Daptomycin 9/27>> 10/12, 10/28>>  Meropenem 9/23 >> 10/14, 10/28 >>    SUBJECTIVE:  RASS -1. + F/C. No distress  VITAL SIGNS: Temp:  [98.3 F (36.8 C)-98.8 F (37.1 C)] 98.7 F (37.1 C) (11/01 0747) Pulse Rate:  [106-127] 123 (11/01 0800) Resp:  [0-32] 21 (11/01 0800) BP: (122-161)/(67-100) 151/74 mmHg (11/01 0800) SpO2:  [88 %-100 %] 100 % (11/01 0827) FiO2 (%):  [28 %] 28 % (11/01 0827) Weight:  [97 kg (213 lb 13.5 oz)] 97 kg (213 lb 13.5 oz) (10/31 1548) HEMODYNAMICS:   VENTILATOR SETTINGS: Vent Mode:  [-]  FiO2 (%):  [28 %] 28 % INTAKE / OUTPUT:  Intake/Output Summary (Last 24 hours) at 09/20/15 0926 Last data filed at 09/20/15 0800  Gross per 24 hour  Intake   1500 ml  Output      0 ml  Net   1500 ml    PHYSICAL EXAMINATION: Physical Examination:   VS: BP 151/74 mmHg  Pulse 123  Temp(Src) 98.7 F (37.1 C) (Axillary)  Resp 21  Ht $R'5\' 9"'BQ$  (1.753 m)  Wt 97 kg (213 lb 13.5 oz)  BMI 31.57 kg/m2  SpO2 100%  LMP  (LMP Unknown)  General Appearance: No resp distress, coarse upper airway sounds  Neuro: profoundly diffusely weak HEENT: cushingoid facies, PERRLA, EOM intact Neck: trach site clean Pulmonary: clear anteriorly Cardiovascular: reg, + syst murmur Abdomen: soft,  NT, +BS Extremities: warm, R>L UE edema    LABORATORY PANEL:   CBC  Recent Labs Lab 09/20/15 0436  WBC 9.5  HGB 7.2*  HCT 23.4*  PLT 194    Chemistries   Recent Labs Lab 09/14/15 1933  09/18/15 0554 09/20/15 0436  NA  --   < > 135 139  K  --   < > 3.6 3.6  CL  --   < > 98* 101  CO2  --   < > 31 32  GLUCOSE  --   < > 305* 164*  BUN  --   < > 46* 37*  CREATININE  --   < > 1.30* 1.02*  CALCIUM  --   < > 7.6* 7.7*  PHOS 3.1  --   --   --   AST  --   < > 28  --   ALT  --   < > 20  --  ALKPHOS  --   < > 1487*  --   BILITOT  --   < > 0.6  --   < > = values in this interval not displayed.   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  P:  MAP goal > 55 mmHg Cont hydrocortisone and midodrine per tube  RENAL A:  ESRD  P:  Monitor BMET intermittently Monitor I/Os Correct electrolytes as indicated Intermittent HD per Renal Service (MWF)  GASTROINTESTINAL A:  LGIB, resolved Hx of C. 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 Not requiring insulin  coverage Chronic steroid therapy P:  Monitor CBGs q 8 hrs - resume SSI if > 180 Cont hydrocortisone @ 10 mg BID  MSK A: Right hip fx - small intertrochanteric R fx - pain management and ortho eval.    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    Marda Stalker, M.D.

## 2015-09-20 NOTE — Progress Notes (Signed)
ID fu No fevers, wbc stable. Dapto changed to linezolid given VRE btu should be suspectible to dapto so I have changed back  BP 160/87 mmHg  Pulse 115  Temp(Src) 98.9 F (37.2 C) (Axillary)  Resp 17  Ht 5\' 9"  (1.753 m)  Wt 97 kg (213 lb 13.5 oz)  BMI 31.57 kg/m2  SpO2 100%  LMP  (LMP Unknown)  Wound examined with RN- extensive wound with clean base in middle, some palpable bone which is firm. Has extensive undermining with necrotic tissue around edge.  Recs Bacteremia - most recently enterococcus faecalis 1/2 bcx  Cont daptomycin for 10 for this indicaiton - will likely need longer - see below Repeat bcx to document clearnace   Trach asp cx with pseudomona- no evidence PNA on cxr, no abundance of trach secretions - would not tailor abx to this organism  Sacral decub with underlying osteomyelitis- cultures have grown multiple drug resistant organisms included below Also has had several bedside debridements. I have discussed with ortho and will see if can do a bone bxp for culture to better direct therapy given the varied pathogens identified so far.  Will hold gram negative culture for next 48 hours to try to get good bone culture results.   9/23 - VRE, Proteus, E coli and enterobacter  9/24 - E coli, Klebsiella (MDRO), Proteus and enterococcus 9/26 no growth Thank you very much for the consult. Will follow with you.

## 2015-09-20 NOTE — Progress Notes (Signed)
Physical Therapy Treatment Patient Details Name: Courtney GrinderHedda McMillian [name] MRN: [id number] DOB: June 18, 1966 Today's Date: 09/20/2015    History of Present Illness Pt is a [age] F who has been in medical facilities (hosp, LTAC, rehab) for 2 yrs following gastric bypass surgery with multiple complications. Now with chronic trach, ESRD, profound debilitation, severe sacral pressure ulcer (stage IV). Was seemingly making progress and transferred to rehab facility approx one week prior to this admission. She was sent to [facility] ED 9/23 with AMS and hypotension. Admitted with sepsis related to infected sacral ulcer.  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  Patient transferred to CCU to med-surg floor during hospitalization, but experienced profound interdialytic hypotension requiring return transfer to CCU for use of pressors (now off).  New orders received to resume PT; cleared by RN for participation as tolerated.  Noted with R hip intertrochanteric fracture, recommended for conservative management per orthopedics.    PT Comments    Patient with improved alertness and visual tracking, command following and active effort noted this session. Noted trace movement in L hip adduct, IR and R biceps, gross grasp this date (improved from previous sessions).  Continues without attempts at verbalization despite donning PMV throughout session (placed/removed by RN). Mobility restrictions secondary to R hip fracture limit mobility progression until healed.  Per clarification with Dr. Joice LoftsPoggi, patient okay for log rolling as needed for dressing change and hygiene; no ROM to R hip; no progression towards sitting edge of bed until R hip heals.   Follow Up Recommendations  LTACH;SNF     Equipment Recommendations       Recommendations for Other Services        Precautions / Restrictions Precautions Precautions: Fall Precaution Comments: stage 4 sacral ulcer, NG tube/NPO, trach, contact isolation, R IJ permcath, L IJ central line, log roll only (as needed for dressing changes/hygiene) and no ROM to R LE/no attempts at sitting Restrictions Weight Bearing Restrictions: No    Mobility  Bed Mobility               General bed mobility comments: Deferred/contraindicated  Transfers                 General transfer comment: Deferred/contraindicated  Ambulation/Gait                 Stairs            Wheelchair Mobility    Modified Rankin (Stroke Patients Only)       Balance                                    Cognition Arousal/Alertness: Awake/alert   Overall Cognitive Status: Difficult to assess (improved visual tracking and following therapist this date)                      Exercises Other Exercises Other Exercises: Act assist/passive ROM to bilat UEs, 1x10: gross grasp/release, wrist flex/ext, forearm pronation/supination, elbow flex/ext, shoulder flex/ext/IR/ER.  Improved active use and spontaneous movement of L UE noted during session; trace to 2-/5 movement R biceps and R hand gross grasp Other Exercises: Passive ROM to L LE, 1x10: ankle pumps, ankle circumduction, hip/knee flex/ext.  Did note trace L hip adduct and IR this date. Other Exercises: Patient with improved visual  scanning/tracking noted this date; sustaining gaze to therapist (even when on R side of body).  Continues without attempts at verbalization, nodding head only (despite placement of PMV)    General Comments        Pertinent Vitals/Pain Pain Assessment: Faces Faces Pain Scale: Hurts a little bit Pain Location: occasional facial grimacing, relieved with rest/reposition Pain Intervention(s): Limited activity within patient's tolerance;Repositioned;Monitored during session    Home Living                       Prior Function            PT Goals (current goals can now be found in the care plan section) Acute Rehab PT Goals Patient Stated Goal: patient unable/refusing to verbalize PT Goal Formulation: Patient unable to participate in goal setting Time For Goal Achievement: 09/22/15 Potential to Achieve Goals: Fair Progress towards PT goals: Progressing toward goals    Frequency  Min 2X/week    PT Plan Current plan remains appropriate (goals updated to reflect current mobility restrictions)    Co-evaluation             End of Session     Patient left: in bed     Time: 8469-6295 PT Time Calculation (min) (ACUTE ONLY): 25 min  Charges:  $Therapeutic Exercise: 23-37 mins                    G Codes:      Chirstina Haan H. Manson Passey, PT, DPT, NCS 09/20/2015, 5:13 PM [phone]

## 2015-09-20 NOTE — Progress Notes (Signed)
Subjective:  TF @ 50 per hour.  Tracheal aspirate= pseudomonas, VRE in blood. ID following Patient remains critically ill Able to follow simple commands HD yesterday - no pressors required     Objective:  Vital signs in last 24 hours:  Temp:  [98.3 F (36.8 C)-98.8 F (37.1 C)] 98.7 F (37.1 C) (11/01 0747) Pulse Rate:  [106-127] 123 (11/01 0800) Resp:  [0-32] 21 (11/01 0800) BP: (122-161)/(67-100) 151/74 mmHg (11/01 0800) SpO2:  [88 %-100 %] 100 % (11/01 0827) FiO2 (%):  [28 %] 28 % (11/01 0827) Weight:  [97 kg (213 lb 13.5 oz)] 97 kg (213 lb 13.5 oz) (10/31 1548)  Weight change: -1 kg (-2 lb 3.3 oz) Filed Weights   09/18/15 0500 09/19/15 0400 09/19/15 1548  Weight: 85 kg (187 lb 6.3 oz) 98 kg (216 lb 0.8 oz) 97 kg (213 lb 13.5 oz)    Intake/Output: I/O last 3 completed shifts: In: 2340 [NG/GT:1890; IV Piggyback:450] Out: 0    Intake/Output this shift:  Total I/O In: 50 [NG/GT:50] Out: -   Physical Exam: General: No acute distress, critically ill  Head: [place]/AT eyes open, tracks in room, NG in place  Eyes: anicteric  Neck: Trach in place  Lungs:  Scattered rhonchi, normal effort  Heart: S1S2 +tachycardia  Abdomen:  Soft, nontender, BS present  Extremities: + dependent edema  Neurologic: Awake, appears depressed  Access:  R IJ permcath.       Basic Metabolic Panel:  Recent Labs Lab 09/14/15 0530 09/14/15 1933 09/16/15 0424 09/16/15 0530 09/18/15 0554 09/20/15 0436  NA 139  --  138 141 135 139  K 3.9  --  3.8 3.6 3.6 3.6  CL 100*  --  101 102 98* 101  CO2 32  --  31 33* 31 32  GLUCOSE 154*  --  146* 67 305* 164*  BUN 65*  --  62* 44* 46* 37*  CREATININE 1.41*  --  1.25* 1.01* 1.30* 1.02*  CALCIUM 7.6*  --  8.0* 7.9* 7.6* 7.7*  PHOS  --  3.1  --   --   --   --     Liver Function Tests:  Recent Labs Lab 09/16/15 0424 09/16/15 0530 09/18/15 0554  AST 19 33 28  ALT 13* 20 20  ALKPHOS 1008* 1486* 1487*  BILITOT 0.5 0.8 0.6  PROT 4.4* 4.2*  4.1*  ALBUMIN 1.5* 1.8* 1.6*   No results for input(s): LIPASE, AMYLASE in the last 168 hours. No results for input(s): AMMONIA in the last 168 hours.  CBC:  Recent Labs Lab 09/14/15 0530 09/16/15 0424 09/16/15 0530 09/18/15 0554 09/20/15 0436  WBC 14.2* 18.1* 11.9* 9.9 9.5  NEUTROABS  --  15.8* 9.7*  --   --   HGB 7.8* 8.2* 7.5* 7.2* 7.2*  HCT 25.2* 25.4* 24.0* 23.0* 23.4*  MCV 98.3 101.5* 101.8* 102.6* 103.8*  PLT 192 236 194 205 194    Cardiac Enzymes: No results for input(s): CKTOTAL, CKMB, CKMBINDEX, TROPONINI in the last 168 hours.  BNP: Invalid input(s): POCBNP  CBG:  Recent Labs Lab 09/19/15 0014 09/19/15 0736 09/19/15 1555 09/19/15 2353 09/20/15 0733  GLUCAP 116* 171* 169* 143* 131*    Microbiology: Results for orders placed or performed during the hospital encounter of 22-Apr-2015  Blood Culture (routine x 2)     Status: None   Collection Time: 22-Apr-2015  8:51 AM  Result Value Ref Range Status   Spec[facility]  [facility]   Special  Requests BOTTLES DRAWN AEROBIC AND ANAEROBIC  3CC  Final   Culture NO GROWTH 5 DAYS  Final   Report Status 08/17/2015 FINAL  Final  Blood Culture (routine x 2)     Status: None   Collection Time: 08/03/2015  9:20 AM  Result Value Ref Range Status   Specimen Description BLOOD LEFT ARM  Final   Special Requests BOTTLES DRAWN AEROBIC AND ANAEROBIC  1CC  Final   Culture NO GROWTH 5 DAYS  Final   Report Status 08/17/2015 FINAL  Final  Wound culture     Status: None   Collection Time: 08/11/2015  9:20 AM  Result Value Ref Range Status   Specimen Description DECUBITIS  Final   Special Requests Normal  Final   Gram Stain   Final    FEW WBC SEEN MANY GRAM NEGATIVE RODS RARE GRAM POSITIVE COCCI    Culture   Final    HEAVY GROWTH ESCHERICHIA COLI MODERATE GROWTH ENTEROBACTER AEROGENES PROTEUS MIRABILIS HEAVY GROWTH ENTEROCOCCUS SPECIES VRE HAVE INTRINSIC RESISTANCE TO MOST COMMONLY USED ANTIBIOTICS AND THE ABILITY  TO ACQUIRE RESISTANCE TO MOST AVAILABLE ANTIBIOTICS.    Report Status 08/16/2015 FINAL  Final   Organism ID, Bacteria ESCHERICHIA COLI  Final   Organism ID, Bacteria ENTEROBACTER AEROGENES  Final   Organism ID, Bacteria PROTEUS MIRABILIS  Final   Organism ID, Bacteria ENTEROCOCCUS SPECIES  Final      Susceptibility   Enterobacter aerogenes - MIC*    CEFTAZIDIME <=1 SENSITIVE Sensitive     CEFAZOLIN >=64 RESISTANT Resistant     CEFTRIAXONE <=1 SENSITIVE Sensitive     CIPROFLOXACIN <=0.25 SENSITIVE Sensitive     GENTAMICIN <=1 SENSITIVE Sensitive     IMIPENEM 1 SENSITIVE Sensitive     TRIMETH/SULFA <=20 SENSITIVE Sensitive     * MODERATE GROWTH ENTEROBACTER AEROGENES   Escherichia coli - MIC*    AMPICILLIN <=2 SENSITIVE Sensitive     CEFTAZIDIME <=1 SENSITIVE Sensitive     CEFAZOLIN <=4 SENSITIVE Sensitive     CEFTRIAXONE <=1 SENSITIVE Sensitive     CIPROFLOXACIN <=0.25 SENSITIVE Sensitive     GENTAMICIN <=1 SENSITIVE Sensitive     IMIPENEM <=0.25 SENSITIVE Sensitive     TRIMETH/SULFA <=20 SENSITIVE Sensitive     * HEAVY GROWTH ESCHERICHIA COLI   Proteus mirabilis - MIC*    AMPICILLIN >=32 RESISTANT Resistant     CEFTAZIDIME <=1 SENSITIVE Sensitive     CEFAZOLIN 8 SENSITIVE Sensitive     CEFTRIAXONE <=1 SENSITIVE Sensitive     CIPROFLOXACIN <=0.25 SENSITIVE Sensitive     GENTAMICIN <=1 SENSITIVE Sensitive     IMIPENEM 1 SENSITIVE Sensitive     TRIMETH/SULFA <=20 SENSITIVE Sensitive     * PROTEUS MIRABILIS   Enterococcus species - MIC*    AMPICILLIN >=32 RESISTANT Resistant     VANCOMYCIN >=32 RESISTANT Resistant     GENTAMICIN SYNERGY SENSITIVE Sensitive     TETRACYCLINE Value in next row Resistant      RESISTANT>=16    * HEAVY GROWTH ENTEROCOCCUS SPECIES  MRSA PCR Screening     Status: None   Collection Time: 08/18/2015  2:38 PM  Result Value Ref Range Status   MRSA by PCR NEGATIVE NEGATIVE Final    Comment:        The GeneXpert MRSA Assay (FDA approved for NASAL  specimens only), is one component of a comprehensive MRSA colonization surveillance program. It is not intended to diagnose MRSA infection nor to guide or monitor treatment  for MRSA infections.   Blood culture (single)     Status: None   Collection Time: 07/22/2015  3:36 PM  Result Value Ref Range Status   Specimen Description BLOOD RIGHT ASSIST CONTROL  Final   Special Requests BOTTLES DRAWN AEROBIC AND ANAEROBIC  Final   Culture NO GROWTH 5 DAYS  Final   Report Status 08/17/2015 FINAL  Final  Wound culture     Status: None   Collection Time: 08/13/15 12:37 PM  Result Value Ref Range Status   Specimen Description WOUND  Final   Special Requests Normal  Final   Gram Stain   Final    FEW WBC SEEN TOO NUMEROUS TO COUNT GRAM NEGATIVE RODS FEW GRAM POSITIVE COCCI    Culture   Final    HEAVY GROWTH ESCHERICHIA COLI MODERATE GROWTH PROTEUS MIRABILIS LIGHT GROWTH KLEBSIELLA PNEUMONIAE MODERATE GROWTH ENTEROCOCCUS GALLINARUM CRITICAL RESULT CALLED TO, READ BACK BY AND VERIFIED WITH: [facility] BORBA AT 1042 08/16/15 DV    Report Status 08/17/2015 FINAL  Final   Organism ID, Bacteria ESCHERICHIA COLI  Final   Organism ID, Bacteria PROTEUS MIRABILIS  Final   Organism ID, Bacteria KLEBSIELLA PNEUMONIAE  Final   Organism ID, Bacteria ENTEROCOCCUS GALLINARUM  Final      Susceptibility   Escherichia coli - MIC*    AMPICILLIN >=32 RESISTANT Resistant     CEFTAZIDIME 4 RESISTANT Resistant     CEFAZOLIN >=64 RESISTANT Resistant     CEFTRIAXONE 16 RESISTANT Resistant     GENTAMICIN 2 SENSITIVE Sensitive     IMIPENEM >=16 RESISTANT Resistant     TRIMETH/SULFA <=20 SENSITIVE Sensitive     Extended ESBL POSITIVE Resistant     PIP/TAZO Value in next row Resistant      RESISTANT>=128    CIPROFLOXACIN Value in next row Sensitive      SENSITIVE<=0.25    * HEAVY GROWTH ESCHERICHIA COLI   Klebsiella pneumoniae - MIC*    AMPICILLIN Value in next row Resistant      SENSITIVE<=0.25     CEFTAZIDIME Value in next row Resistant      SENSITIVE<=0.25    CEFAZOLIN Value in next row Resistant      SENSITIVE<=0.25    CEFTRIAXONE Value in next row Resistant      SENSITIVE<=0.25    CIPROFLOXACIN Value in next row Resistant      SENSITIVE<=0.25    GENTAMICIN Value in next row Sensitive      SENSITIVE<=0.25    IMIPENEM Value in next row Resistant      SENSITIVE<=0.25    TRIMETH/SULFA Value in next row Resistant      SENSITIVE<=0.25    PIP/TAZO Value in next row Resistant      RESISTANT>=128    * LIGHT GROWTH KLEBSIELLA PNEUMONIAE   Proteus mirabilis - MIC*    AMPICILLIN Value in next row Resistant      RESISTANT>=128    CEFTAZIDIME Value in next row Sensitive      RESISTANT>=128    CEFAZOLIN Value in next row Sensitive      RESISTANT>=128    CEFTRIAXONE Value in next row Sensitive      RESISTANT>=128    CIPROFLOXACIN Value in next row Sensitive      RESISTANT>=128    GENTAMICIN Value in next row Sensitive      RESISTANT>=128    IMIPENEM Value in next row Sensitive      RESISTANT>=128    TRIMETH/SULFA Value in next row Sensitive  RESISTANT>=128    PIP/TAZO Value in next row Sensitive      SENSITIVE<=4    * MODERATE GROWTH PROTEUS MIRABILIS   Enterococcus gallinarum - MIC*    AMPICILLIN Value in next row Resistant      SENSITIVE<=4    GENTAMICIN SYNERGY Value in next row Sensitive      SENSITIVE<=4    CIPROFLOXACIN Value in next row Resistant      RESISTANT>=8    TETRACYCLINE Value in next row Resistant      RESISTANT>=16    * MODERATE GROWTH ENTEROCOCCUS GALLINARUM  Wound culture     Status: None   Collection Time: 08/15/15  2:53 PM  Result Value Ref Range Status   Specimen Description WOUND  Final   Special Requests NONE  Final   Gram Stain FEW WBC SEEN NO ORGANISMS SEEN   Final   Culture NO GROWTH 3 DAYS  Final   Report Status 08/18/2015 FINAL  Final  C difficile quick scan w PCR reflex     Status: None   Collection Time: 08/17/15 11:34 AM   Result Value Ref Range Status   C Diff antigen NEGATIVE NEGATIVE Final   C Diff toxin NEGATIVE NEGATIVE Final   C Diff interpretation Negative for C. difficile  Final  Stool culture     Status: None   Collection Time: 09/03/15  3:51 PM  Result Value Ref Range Status   Specimen Description STOOL  Final   Special Requests Immunocompromised  Final   Culture   Final    NO SALMONELLA OR SHIGELLA ISOLATED No Pathogenic E. coli detected NO CAMPYLOBACTER DETECTED    Report Status 09/07/2015 FINAL  Final  C difficile quick scan w PCR reflex     Status: None   Collection Time: 09/13/15 12:51 AM  Result Value Ref Range Status   C Diff antigen NEGATIVE NEGATIVE Final   C Diff toxin NEGATIVE NEGATIVE Final   C Diff interpretation Negative for C. difficile  Final  Culture, blood (routine x 2)     Status: None (Preliminary result)   Collection Time: 09/16/15 11:24 AM  Result Value Ref Range Status   Specimen Description BLOOD LEFT HAND  Final   Special Requests BOTTLES DRAWN AEROBIC AND ANAEROBIC  1CC  Final   Culture NO GROWTH 2 DAYS  Final   Report Status PENDING  Incomplete  Culture, blood (routine x 2)     Status: None (Preliminary result)   Collection Time: 09/16/15 12:23 PM  Result Value Ref Range Status   Specimen Description BLOOD RIGHT HAND  Final   Special Requests BOTTLES DRAWN AEROBIC AND ANAEROBIC  1CC  Final   Culture  Setup Time   Final    GRAM POSITIVE COCCI AEROBIC BOTTLE ONLY CRITICAL RESULT CALLED TO, READ BACK BY AND VERIFIED WITH: TESS THOMAS,RN 09/17/2015 0631 BY JRS.    Culture   Final    ENTEROCOCCUS FAECALIS AEROBIC BOTTLE ONLY VRE HAVE INTRINSIC RESISTANCE TO MOST COMMONLY USED ANTIBIOTICS AND THE ABILITY TO ACQUIRE RESISTANCE TO MOST AVAILABLE ANTIBIOTICS. CRITICAL RESULT CALLED TO, READ BACK BY AND VERIFIED WITH: CHERYL SMITH AT 0818 09/19/15 DV    Report Status PENDING  Incomplete   Organism ID, Bacteria ENTEROCOCCUS FAECALIS  Final      Susceptibility    Enterococcus faecalis - MIC*    AMPICILLIN <=2 SENSITIVE Sensitive     LINEZOLID 2 SENSITIVE Sensitive     CIPROFLOXACIN Value in next row Resistant  RESISTANT>=8    TETRACYCLINE Value in next row Resistant      RESISTANT>=16    VANCOMYCIN Value in next row Resistant      RESISTANT>=32    GENTAMICIN SYNERGY Value in next row Resistant      RESISTANT>=32    * ENTEROCOCCUS FAECALIS  Culture, respiratory (NON-Expectorated)     Status: None   Collection Time: 09/16/15  3:50 PM  Result Value Ref Range Status   Specimen Description TRACHEAL ASPIRATE  Final   Special Requests Immunocompromised  Final   Gram Stain   Final    FEW WBC SEEN GOOD SPECIMEN - 80-90% WBCS RARE GRAM NEGATIVE RODS    Culture LIGHT GROWTH PSEUDOMONAS AERUGINOSA  Final   Report Status 09/19/2015 FINAL  Final   Organism ID, Bacteria PSEUDOMONAS AERUGINOSA  Final      Susceptibility   Pseudomonas aeruginosa - MIC*    CEFTAZIDIME 8 SENSITIVE Sensitive     CIPROFLOXACIN 2 INTERMEDIATE Intermediate     GENTAMICIN >=16 RESISTANT Resistant     IMIPENEM >=16 RESISTANT Resistant     PIP/TAZO Value in next row Sensitive      SENSITIVE32    CEFEPIME Value in next row Sensitive      SENSITIVE8    LEVOFLOXACIN Value in next row Resistant      RESISTANT>=8    * LIGHT GROWTH PSEUDOMONAS AERUGINOSA    Coagulation Studies: No results for input(s): LABPROT, INR in the last 72 hours.  Urinalysis: No results for input(s): COLORURINE, LABSPEC, PHURINE, GLUCOSEU, HGBUR, BILIRUBINUR, KETONESUR, PROTEINUR, UROBILINOGEN, NITRITE, LEUKOCYTESUR in the last 72 hours.  Invalid input(s): APPERANCEUR    Imaging: No results found.   Medications:   . feeding supplement (VITAL AF 1.2 CAL) 1,000 mL (09/20/15 0748)   . antiseptic oral rinse  7 mL Mouth Rinse q12n4p  . ceFEPime (MAXIPIME) IV  2 g Intravenous Once per day on Mon Wed Fri  . chlorhexidine  15 mL Mouth Rinse BID  . epoetin (EPOGEN/PROCRIT) injection  10,000  Units Intravenous Q M,W,F-HD  . feeding supplement (PRO-STAT SUGAR FREE 64)  30 mL Per Tube 6 times per day  . folic acid  1 mg Oral Daily  . heparin subcutaneous  5,000 Units Subcutaneous Q12H  . hydrocortisone  10 mg Per Tube BID  . lidocaine  1 patch Transdermal Q24H  . linezolid (ZYVOX) IV  600 mg Intravenous Q12H  . midodrine  5 mg Per Tube TID WC  . multivitamin  5 mL Oral Daily  . nystatin   Topical TID  . sodium chloride  10-40 mL Intracatheter Q12H  . sodium hypochlorite   Irrigation BID   sodium chloride, sodium chloride, acetaminophen (TYLENOL) oral liquid 160 mg/5 mL, HYDROmorphone (DILAUDID) injection, ipratropium-albuterol, morphine injection, oxyCODONE, sodium chloride  Assessment/ Plan:  49 y.o. 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 Admitted on 08/19/2015  1. End-stage renal disease on hemodialysis on HD MWF. The patient has been on dialysis since October of 2014. R IJ permcath.   - Plan on continuing  hemodialysis on MWF schedule.  - midodine and albumin with treatment.   - We have also used sodium modeling with treatments.  - Dialysis tomorrow  2. Anemia of CKD: pt continues to require periodic blood transfusions, hemoglobin of 7.2 yesterday.  - epo has not been as effective as hoped secondary due to chronic inflammation from decubitus ulcer/infection   continue epogen with HD. This has been  ordered as a standing order.   3. Secondary hyperparathyroidism: PTH low at 55 - phos was acceptable at 3.1 - no binders  4. ID: VRE in blood Pseudomonas in tracheal aspirate   LOS: 39 Melessa Cowell 11/1/20169:29 AM

## 2015-09-20 NOTE — Progress Notes (Addendum)
Nutrition Follow-up  DOCUMENTATION CODES:   Severe malnutrition in context of acute illness/injury  INTERVENTION:   Coordination of Care: discussed nutritional poc during ICU rounds; received verbal order from MD [name] to restart previous diet (this was discussed with SLP via the phone, pt previously on Dysphagia I, Nectar Thick as per SLP and as per reviewed orders). Dysphagia I, Nectar Thick diet ordered. Plan to start calorie count to assess nutritional intake of oral diet. While performing calorie count, plan to hold TF. Discussed continuation of Prostat supplements (only 30 mL per packet) as the volume is negligible but provides good source of protein (15 g of protein per packet); MD stated to discontinue Vital 1.2 AF and Prostat at this time while calorie count in progress. Per MD [name], dobhoff tube is to remain in place. Detailed calorie count orders placed and discussed with Dione Housekeeperheryl RN. RD can be paged with questions. Will continue to assess Medical Food Supplement Therapy: plan to add Sugar Free Mighty Shakes and Magic Cup TID on all meal trays Meals and Snacks: Cater to patient preferences   NUTRITION DIAGNOSIS:   Inadequate oral intake related to wound healing, acute illness, chronic illness as evidenced by NPO status, estimated needs. Continues but being addressed via calorie count  GOAL:   Patient will meet greater than or equal to 90% of their needs  MONITOR:    (Energy Intake, Anthropometrics, Digestive System, Electrolyte/Renal Profile, Glucose Profile)  REASON FOR ASSESSMENT:   Consult Enteral/tube feeding initiation and management  ASSESSMENT:   ID MD re-consulted and assessed pt yesterday. Continues with scheduled HD.    Diet Order:  NPO  EN: tolerating Vital 1.2 AF TF at rate of 50 ml/hr with Prostat 6 packets per day  Digestive System: loose stool continues, counted 4 large BMs in 24 hour period per documentation   Skin:   (stage IV sacrum,  stage III hip, unstageable foot; rectal ulcer from fleixseal, ulcer in nose from dobhoff)  Electrolyte and Renal Profile:  Recent Labs Lab 09/14/15 1933  09/16/15 0530 09/18/15 0554 09/20/15 0436  BUN  --   < > 44* 46* 37*  CREATININE  --   < > 1.01* 1.30* 1.02*  NA  --   < > 141 135 139  K  --   < > 3.6 3.6 3.6  PHOS 3.1  --   --   --   --   < > = values in this interval not displayed. Glucose Profile:  Recent Labs  09/19/15 1555 09/19/15 2353 09/20/15 0733  GLUCAP 169* 143* 131*   Meds: imodium prn discontinued, acidophilus discontinued, MVI  Height:   Ht Readings from Last 1 Encounters:  09/17/15 5\' 9"  (1.753 m)    Weight:   Wt Readings from Last 1 Encounters:  09/19/15 213 lb 13.5 oz (97 kg)    Filed Weights   09/18/15 0500 09/19/15 0400 09/19/15 1548  Weight: 187 lb 6.3 oz (85 kg) 216 lb 0.8 oz (98 kg) 213 lb 13.5 oz (97 kg)  '  BMI:  Body mass index is 31.57 kg/(m^2).  Estimated Nutritional Needs:   Kcal:  2175-2535 kcals (BEE 1509, 1.2 AF, 1.2-1.4 IF) or 2490-2905 kcals (30-35 kcals/kg)   Protein:  125-166 g (1.5-2.0 g/kg) but likely closer to 166-208 g (2.0-2.5 g/kg)   Fluid:  1000 mL plus UOP  EDUCATION NEEDS:   No education needs identified at this time  HIGH Care Level  Romelle Starcherate Ceanna Wareing MS, RD, LDN [phone]  Pager

## 2015-09-20 NOTE — Care Management (Signed)
Health team will meet with patient's family Sunday  11/6 at 12:30.  Tube feedings placed on hold today- diet order/meal trays to be reordered with oral feeding trials and calorie count.  ID has consulted.

## 2015-09-21 ENCOUNTER — Inpatient Hospital Stay: Payer: 59

## 2015-09-21 LAB — CBC
HCT: 24.7 % — ABNORMAL LOW (ref 35.0–47.0)
HEMOGLOBIN: 7.6 g/dL — AB (ref 12.0–16.0)
MCH: 32.2 pg (ref 26.0–34.0)
MCHC: 30.9 g/dL — AB (ref 32.0–36.0)
MCV: 104.1 fL — ABNORMAL HIGH (ref 80.0–100.0)
Platelets: 207 10*3/uL (ref 150–440)
RBC: 2.37 MIL/uL — ABNORMAL LOW (ref 3.80–5.20)
RDW: 22.8 % — AB (ref 11.5–14.5)
WBC: 11.7 10*3/uL — ABNORMAL HIGH (ref 3.6–11.0)

## 2015-09-21 LAB — GLUCOSE, CAPILLARY
GLUCOSE-CAPILLARY: 58 mg/dL — AB (ref 65–99)
GLUCOSE-CAPILLARY: 63 mg/dL — AB (ref 65–99)
GLUCOSE-CAPILLARY: 63 mg/dL — AB (ref 65–99)
GLUCOSE-CAPILLARY: 64 mg/dL — AB (ref 65–99)
GLUCOSE-CAPILLARY: 68 mg/dL (ref 65–99)
GLUCOSE-CAPILLARY: 83 mg/dL (ref 65–99)
Glucose-Capillary: 86 mg/dL (ref 65–99)
Glucose-Capillary: 84 mg/dL (ref 65–99)
Glucose-Capillary: 59 mg/dL — ABNORMAL LOW (ref 65–99)
Glucose-Capillary: 62 mg/dL — ABNORMAL LOW (ref 65–99)

## 2015-09-21 LAB — CULTURE, BLOOD (ROUTINE X 2): Culture: NO GROWTH

## 2015-09-21 LAB — BASIC METABOLIC PANEL
Anion gap: 7 (ref 5–15)
BUN: 46 mg/dL — AB (ref 6–20)
CALCIUM: 8.1 mg/dL — AB (ref 8.9–10.3)
CO2: 32 mmol/L (ref 22–32)
CREATININE: 1.26 mg/dL — AB (ref 0.44–1.00)
Chloride: 102 mmol/L (ref 101–111)
GFR calc Af Amer: 57 mL/min — ABNORMAL LOW (ref >60)
GFR, EST NON AFRICAN AMERICAN: 49 mL/min — AB (ref >60)
GLUCOSE: 69 mg/dL (ref 65–99)
Potassium: 3.7 mmol/L (ref 3.5–5.1)
Sodium: 141 mmol/L (ref 135–145)

## 2015-09-21 LAB — BLOOD GAS, ARTERIAL
ALLENS TEST (PASS/FAIL): POSITIVE — AB
Acid-Base Excess: 12.5 mmol/L — ABNORMAL HIGH (ref 0.0–3.0)
Bicarbonate: 37.7 mEq/L — ABNORMAL HIGH (ref 21.0–28.0)
FIO2: 40
O2 Saturation: 92.6 %
PCO2 ART: 53 mmHg — AB (ref 32.0–48.0)
PH ART: 7.46 — AB (ref 7.350–7.450)
Patient temperature: 37
pO2, Arterial: 62 mmHg — ABNORMAL LOW (ref 83.0–108.0)

## 2015-09-21 LAB — CK: Total CK: 10 U/L — ABNORMAL LOW (ref 38–234)

## 2015-09-21 MED ORDER — ALBUMIN HUMAN 25 % IV SOLN
12.5000 g | Freq: Once | INTRAVENOUS | Status: AC
  Administered 2015-09-21: 12.5 g via INTRAVENOUS
  Filled 2015-09-21 (×2): qty 50

## 2015-09-21 MED ORDER — SODIUM CHLORIDE 0.9 % IV SOLN
500.0000 mg | INTRAVENOUS | Status: DC
  Administered 2015-09-22 – 2015-09-28 (×4): 500 mg via INTRAVENOUS
  Filled 2015-09-21 (×4): qty 10

## 2015-09-21 MED ORDER — DEXTROSE 50 % IV SOLN
INTRAVENOUS | Status: AC
  Filled 2015-09-21: qty 50

## 2015-09-21 MED ORDER — DEXTROSE 50 % IV SOLN
25.0000 mL | Freq: Once | INTRAVENOUS | Status: AC
  Administered 2015-09-21: 25 mL via INTRAVENOUS

## 2015-09-21 MED ORDER — DEXTROSE 50 % IV SOLN
25.0000 mL | Freq: Once | INTRAVENOUS | Status: AC
Start: 2015-09-21 — End: 2015-09-21
  Administered 2015-09-21: 25 mL via INTRAVENOUS

## 2015-09-21 MED ORDER — DEXTROSE 50 % IV SOLN
25.0000 mL | Freq: Once | INTRAVENOUS | Status: AC
Start: 2015-09-21 — End: 2015-09-21

## 2015-09-21 MED ORDER — DEXTROSE 10 % IV SOLN
INTRAVENOUS | Status: DC
  Administered 2015-09-21 – 2015-09-22 (×2): via INTRAVENOUS

## 2015-09-21 MED ORDER — DEXTROSE 50 % IV SOLN
INTRAVENOUS | Status: AC
  Administered 2015-09-21: 25 mL
  Filled 2015-09-21: qty 50

## 2015-09-21 NOTE — Progress Notes (Signed)
Hypoglycemic Event  CBG: 68  Treatment: 25ML D50  Symptoms: none  Follow-up CBG: Time: 0100 Result:86      Hayden PedroPamela Z Marquel Pottenger

## 2015-09-21 NOTE — Progress Notes (Addendum)
Hypoglycemic Event  CBG:58  Treatment:2725ml D50  Symptoms: none  Follow-up CBG: Time:0630 CBG Result:084  Possible Reasons for Event: Inadequate meal intake  MD informed no new orders at this time.      Hayden PedroPamela Z Delmer Kowalski

## 2015-09-21 NOTE — Progress Notes (Signed)
   09/21/15 1500  Clinical Encounter Type  Visited With Patient  Visit Type Follow-up;Spiritual support  Consult/Referral To Chaplain  Spiritual Encounters  Spiritual Needs Emotional  Stress Factors  Patient Stress Factors None identified  Chaplain rounded in the unit and stopped in to check with patient. Patient was available and alert. Asked questions and she responded with head nods. Left her with a faith statement and we held each other hand. Will follow up.  Chaplain Caprice Mccaffrey A. Micharl Helmes Ext. [phone]

## 2015-09-21 NOTE — Progress Notes (Signed)
eLink Physician-Brief Progress Note Patient Name: Courtney [name] DOB: [birth year] MRN: [id number]   Date of Service  09/21/2015  HPI/Events of Note  Increased O2 requirement over last several hours. CXR reveals new infiltrate vs atelectasis at L base. However, the patient is afebrile (Temp = 98.7 F) and there is no change is sputum production or character.   eICU Interventions  Will try recruitment maneuver with ambu bag and PEEP valve (PEEP = 10) Q 2 hours while awake.      Intervention Category Intermediate Interventions: Respiratory distress - evaluation and management  Sommer,Steven Eugene 09/21/2015, 3:30 PM

## 2015-09-21 NOTE — Progress Notes (Signed)
HD tx start 

## 2015-09-21 NOTE — Progress Notes (Signed)
Lung recruitment performed with MVB with 10cm PEEP x15 breaths with trach cuff inflated. Breath sounds clear. Trach cuff deflated and pt. Placed back on Aerosol Trach Collar at 35% with SpO2 99%. Pt. Tolerated well.

## 2015-09-21 NOTE — Progress Notes (Signed)
I informed Dr. [name] of patient's low blood sugars throughout the night and this morning and order received for D10.

## 2015-09-21 NOTE — Progress Notes (Addendum)
Calorie Count Note  Diet: Dysphagia I, Nectar Thick Supplements: Sugar Free Mighty Shakes TID with meals, Magic Cup TID with meals  09/20/2015 Tuesday Breakfast (09/20/15): pt NPO and receiving TF, no meal tray yet Lunch (09/20/15): None Dinner (09/20/15): 1/2 teaspoon mashed potatoes, 1/2 teaspoon nectar apple juice Supplements: None  09/21/2015 Wednesday Breakfast (09/21/15): None   Total intake: Calories: <50 kcals per day but nutrition intake essentially negligible or unmeasurable Protein: None   Romelle Starcherate Yahaira Bruski MS, RD, LDN [phone] Pager

## 2015-09-21 NOTE — Progress Notes (Signed)
Informed by RT and RN that pts sats dropped after pt pulled off trach collar. Sats recovered after increasing oxygen to 40% and bagging.  I asked RT to change trach to cuffed trach in case pt worsens and needs to be put back on vent overnight. Courtney [name] was replaced with cuff down .

## 2015-09-21 NOTE — Progress Notes (Signed)
Pt received night time snack of pudding and chicken salad.  Offered snack to pt.  Gave her a choice of either and/or both.  Pt refused snack.  Pt shook head "no".  Waited awhile and offered again.  Pt shook her head "no".

## 2015-09-21 NOTE — Progress Notes (Signed)
[facility] Care Medicine Progess Note  Name: Courtney GrinderHedda McMillian Monroe MRN: [id number] DOB: 04/14/1966    ADMISSION DATE:  08/03/2015     INITIAL PRESENTATION:  [age] F who has been in medical facilities (hosp, LTAC, rehab) for 2 yrs following gastric bypass surgery with multiple complications. Now with chronic trach, ESRD, profound debilitation, severe sacral pressure ulcer. Was seemingly making progress and transferred to rehab facility approx one week prior to this admission. She [facility] ED with AMS and hypotension. Working dx of severe sepsis/septic shock due to infected sacral pressure ulcer. Also has R side externalized HD cath and tunneled L IJ CVL. She has history of resistant bacterial infections. She is on chronic steroids. Her husband indicates that she has been diagnosed with adrenal insuff at some point during the past 2 yrs  INTERVAL HISTORY: Overnight, the patient continues to be relatively stable hemodynamically. The tube feeds were discontinued yesterday and the patient was started on a calorie count, per nursing documentation, she is taking in minimal by mouth diet. Unfortunately, she continues to refuse oral diet, she purses her lips not ahead no. She appears to be capable of speaking and communicating further and capable of eating, however, it would appear that this is simply refusing these things. Husband also came in yesterday and try diffuse. The patient, but apparently she had refused. She nods her head yes and no to questions. She indicates to me that she has no new complaints today.  MAJOR EVENTS/TEST RESULTS: Admission 02/07/14-05/07/14 Admission 07/21/14-09/06/14 Discharged to [facility]. Pt had palliative consult at that time, were asked to sign off by husband.  09/23 CT head: NAD 09/23 EEG: no epileptiform activity 09/23 PRBCs for Hgb 6.4 09/24 bedside debridement of sacral wound. Abscess drained 09/25 Off vasopressors. More alert. No distress. Worsening  thrombocytopenia. Vanc DC'd 09/29  Dr. [name] (I.D) excused from the case by patient's husband. 10/02 MRI -multiple infarcts 10/03 tracheal bleeding- transfused platelets 10/03 hospitalist service excused from the case by patient's husband 10/03 Echocardiogram ejection fraction was 55-60%, pulmonary systolic pressure was 39 mmHg 62/1310/08 restart TF's at lower rate, attempt reg HD  10/12 Transferred to med-surg floor. Remains on PCCM service 10/14 SLP eval: pt unable to tolerate PMV adequately 10/17-will re-attempt PM valve-discussed with Speech therapist 10/18 passed swallow eval-start pureed thick foods no thin liquids-continue NG feeds 10/19 transferred to step down for sepsis/aspiration pneumonia 10/19 cxr shows RLL opacity 10/21 sacral decub debride at bedside by surgery 10/22 started back on vasopressors while on HD 10/27 CT with osteo, R hip fx, unable to identify tip of dubhoff tube - sent for fluoro study 10/28 Ortho consultation: I do not feel that she is a surgical candidate. Therefore, I feel that it would best to manage this fracture nonsurgically and allow it to heal by itself over time, which it should.  10/28 Gen Surg consultation: Due to the lack of free air or free flow of contrast and the peritoneum there is no indication for any surgical intervention on this. Would recommend pulling back the feeding tube 1-2 cm. Would recheck an abdominal film to confirm no pneumoperitoneum in the morning. Absence of any changes okay to continue using Dobbhoff for feeding and medications. 10/28 gastrograffin study: The study confirms that the feeding tube pes perforated through the duodenum and the tip is within a cavity that fills with injected contrast. The cavity does appear walled off INDWELLING DEVICES:: Trach (chronic) placed June 2014 Tunneled R IJ HD cath (chronic)  Tunneled L IJ CVL (chronic) L femoral A-line 9/23 >> 9/25  MICRO DATA: History of carbopenem resistant enterococcus  and recurrent c. diff from previous hospitalizations. 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 Pneumoniae, P. Mirabilis, VRE Wound 9/26 >> No growth Blood 9/23 >> NEG CDiff 9/27>>neg Stool Cx 10/15>> negative Cdiff 10/25>>neg Trach Aspirate 10/28>> light growth pseudomonas Blood 10/28 >> 1/2 GPC >>   ANTIMICROBIALS:  Aztreonam 9/23 >> 9/24 Vanc 9/23 >> 9/25 Vanc 9/26>>9/27 Daptomycin 9/27>> 10/12, 10/28>>  Meropenem 9/23 >> 10/14, 10/28 >>    SUBJECTIVE:  RASS -1. + F/C. No distress  VITAL SIGNS: Temp:  [98.2 F (36.8 C)-99.1 F (37.3 C)] 98.2 F (36.8 C) (11/02 0900) Pulse Rate:  [103-124] 108 (11/02 1115) Resp:  [0-30] 16 (11/02 1115) BP: (133-163)/(71-105) 133/74 mmHg (11/02 1115) SpO2:  [95 %-100 %] 97 % (11/02 1115) FiO2 (%):  [28 %] 28 % (11/02 0800) Weight:  [94 kg (207 lb 3.7 oz)-96 kg (211 lb 10.3 oz)] 94 kg (207 lb 3.7 oz) (11/02 0500) HEMODYNAMICS:   VENTILATOR SETTINGS: Vent Mode:  [-]  FiO2 (%):  [28 %] 28 % INTAKE / OUTPUT:  Intake/Output Summary (Last 24 hours) at 09/21/15 1126 Last data filed at 09/20/15 1803  Gross per 24 hour  Intake 190.37 ml  Output      0 ml  Net 190.37 ml    PHYSICAL EXAMINATION: Physical Examination:   VS: BP 133/74 mmHg  Pulse 108  Temp(Src) 98.2 F (36.8 C) (Axillary)  Resp 16  Ht  (1.753 m)  Wt 94 kg (207 lb 3.7 oz)  BMI 30.59 kg/m2  SpO2 97%  LMP  (LMP Unknown)  General Appearance: No resp distress, coarse upper airway sounds  Neuro: profoundly diffusely weak HEENT: cushingoid facies, PERRLA, EOM intact Neck: trach site clean Pulmonary: clear anteriorly Cardiovascular: reg, + syst murmur Abdomen: soft, NT, +BS Extremities: warm, R>L UE edema    LABORATORY PANEL:   CBC  Recent Labs Lab 09/21/15 0459  WBC 11.7*  HGB 7.6*  HCT 24.7*  PLT 207    Chemistries   Recent Labs Lab  09/14/15 1933  09/18/15 0554  09/21/15 0459  NA  --   < > 135  < > 141  K  --   < > 3.6  < > 3.7  CL  --   < > 98*  < > 102  CO2  --   < > 31  < > 32  GLUCOSE  --   < > 305*  < > 69  BUN  --   < > 46*  < > 46*  CREATININE  --   < > 1.30*  < > 1.26*  CALCIUM  --   < > 7.6*  < > 8.1*  PHOS 3.1  --   --   --   --   AST  --   < > 28  --   --   ALT  --   < > 20  --   --   ALKPHOS  --   < > 1487*  --   --   BILITOT  --   < > 0.6  --   --   < > = values in this interval not displayed.   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  P:  MAP goal > 55 mmHg Cont hydrocortisone and midodrine per tube  RENAL A:  ESRD  P:  Monitor BMET intermittently Monitor I/Os Correct electrolytes as indicated Intermittent HD per Renal Service (MWF)  GASTROINTESTINAL A:  LGIB, resolved Hx of C. Diff Stool incontinence   P:  Cont PPI Resume TFs 10/30  HEMATOLOGIC A:  Acute on chronic anemia S/p Thrombocytopenia - HIT panel negative 9/25, resolved P: DVT px: SCDs + SQ heparin Monitor CBC intermittently Transfuse per usual ICU guidelines  INFECTIOUS A:  Recurrent Severe sepsis, due to sacral decub, currently stable.  Sacral decubitus ulcer with abscess + blood culture - ?real or contaminant P:  Monitor temp, WBC count Micro and abx as above  ENDOCRINE A:  DM 2, controlled. Not requiring insulin coverage Chronic steroid therapy P:  Monitor CBGs q 8 hrs - resume SSI if > 180 Cont hydrocortisone @ 10 mg BID  MSK A: Right hip fx - small intertrochanteric R fx - pain management and ortho eval.    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 Plan for meeting with husband on Sunday.  I personally spoke with the husband today and updated him via telephone.    Wells Guiles, M.D.   Critical Care Attestation.  I have personally obtained a history, examined the patient, evaluated laboratory and imaging results, formulated the assessment and plan and placed orders. The Patient requires high complexity decision making for assessment and support, frequent evaluation and titration of therapies, application of advanced monitoring technologies and extensive interpretation of multiple databases. The patient has critical illness that could lead imminently to failure of 1 or more organ systems and requires the highest level of physician preparedness to intervene.  Critical Care Time devoted to patient care services described in this note is 35 minutes and is exclusive of time spent in procedures.

## 2015-09-21 NOTE — Progress Notes (Signed)
Post hd tx 

## 2015-09-21 NOTE — Progress Notes (Signed)
Pre-hd tx 

## 2015-09-21 NOTE — Progress Notes (Signed)
This RN called to the Rm, pt sats 78%, pt obs by NT to have pulled of trach collar, pt suctioned x2 without increase in sats, RT called for assistance

## 2015-09-21 NOTE — Progress Notes (Signed)
Upon returned to unit updated by covering RN that  Nurse Tech was at patient's bedside and did not appear to be in distress as patient was smiling and talking to her and her O2 sat was 92%. She said patient then reached up and pulled her trach collar off and patient's O2 sat decreased to 78%. Nurse tech says she put trach collar back on and she pulled it off again. Covering nurse and respiratory therapist in to assess patient.

## 2015-09-21 NOTE — Progress Notes (Signed)
HD tx completed.

## 2015-09-21 NOTE — Progress Notes (Signed)
eLink Physician-Brief Progress Note Patient Name: Courtney GrinderHedda McMillian [name] DOB: 07/11/1966 MRN: [id number]   Date of Service  09/21/2015  HPI/Events of Note  Blood glucose = 62.   eICU Interventions  Will increase D10 IV infusion from 40 to 60 mL/hour.      Intervention Category Intermediate Interventions: Other:  Courtney [name],Courtney [name] 09/21/2015, 5:51 PM

## 2015-09-21 NOTE — Progress Notes (Signed)
In patient's room to feed her dinner. Cuff deflated on trach and PMV placed. I asked patient if she was hungry and she said yes. I then told her what was on her tray and she began to shake her back and forth. She did drink 1/4 tsp of apple juice. I offered her some potatoes and she shook her head back and forth. I offered her the magic cup and she declined.

## 2015-09-21 NOTE — Progress Notes (Signed)
I paged Dr. [name] and notified him of patient's episode of O2 sat of 78%. Orders received for ABG and chest x-ray.

## 2015-09-21 NOTE — Therapy (Signed)
Called to patient room due to desaturation. Nurse reported that the patient had removed her trach collar and desaturated. SpO2 did not go back up when trach collar was placed correctly. Nurse reported suctioning patient for small amount of thick secretions. SpO2 still did not improve. Patient found with SpO2 88% on FiO2 .28. RR 32, shallow. Patient bagged, lavaged and suctioned for small amount of thick pale yellow secretions, inner cannula changed. FiO2 increased to .40 to obtain SpO2 greater than 90%. Dr.Ramachandran notified of change in patient status. Order received for ABG, CXR and trach tube change to cuffed trach in the event patient would need a vent. ABG showed PaO2 in 60's on FiO2 .40. Nurse called results to Dr.Sommer. Order received for alveolar recruitment maneuver using AMBU bag with PEEP +10 with lavage and suction. Trach tube changed to #6.0 cuffed Shiley without incident to facilitate recruitment.  Trach ties changed, trach care done, cuff inflated, patient manually bagged with PEEP +10, lavaged and suctioned. No effect noted on SpO2. Current SpO2 93% on FiO2 .40.

## 2015-09-21 NOTE — Progress Notes (Signed)
ANTIBIOTIC CONSULT NOTE - Follow Up  Pharmacy Consult for daptomycin  Indication: Bacteremia   Allergies  Allergen Reactions  . Contrast Media [Iodinated Diagnostic Agents] Anaphylaxis  . Ampicillin Rash    Patient Measurements: Height: 5\' 9"  (175.3 cm) Weight: 207 lb 3.7 oz (94 kg) IBW/kg (Calculated) : 66.2  Vital Signs: Temp: 98.2 F (36.8 C) (11/02 0900) Temp Source: Axillary (11/02 0900) BP: 125/78 mmHg (11/02 1200) Pulse Rate: 113 (11/02 1200) Intake/Output from previous day: 11/01 0701 - 11/02 0700 In: 640.4 [NG/GT:229.2; IV Piggyback:411.2] Out: -  Intake/Output from this shift:    Labs:  Recent Labs  09/20/15 0436 09/21/15 0459  WBC 9.5 11.7*  HGB 7.2* 7.6*  PLT 194 207  CREATININE 1.02* 1.26*   Estimated Creatinine Clearance: 65.2 mL/min (by C-G formula based on Cr of 1.26).  Recent Labs  09/19/15 0944  GENTTROUGH 2.7*     Microbiology: Recent Results (from the past 720 hour(s))  Stool culture     Status: None   Collection Time: 09/03/15  3:51 PM  Result Value Ref Range Status   Specimen Description STOOL  Final   Special Requests Immunocompromised  Final   Culture   Final    NO SALMONELLA OR SHIGELLA ISOLATED No Pathogenic E. coli detected NO CAMPYLOBACTER DETECTED    Report Status 09/07/2015 FINAL  Final  C difficile quick scan w PCR reflex     Status: None   Collection Time: 09/13/15 12:51 AM  Result Value Ref Range Status   C Diff antigen NEGATIVE NEGATIVE Final   C Diff toxin NEGATIVE NEGATIVE Final   C Diff interpretation Negative for C. difficile  Final  Culture, blood (routine x 2)     Status: None (Preliminary result)   Collection Time: 09/16/15 11:24 AM  Result Value Ref Range Status   Specimen Description BLOOD LEFT HAND  Final   Special Requests BOTTLES DRAWN AEROBIC AND ANAEROBIC  1CC  Final   Culture NO GROWTH 4 DAYS  Final   Report Status PENDING  Incomplete  Culture, blood (routine x 2)     Status: None   Collection  Time: 09/16/15 12:23 PM  Result Value Ref Range Status   Specimen Description BLOOD RIGHT HAND  Final   Special Requests BOTTLES DRAWN AEROBIC AND ANAEROBIC  1CC  Final   Culture  Setup Time   Final    GRAM POSITIVE COCCI AEROBIC BOTTLE ONLY CRITICAL RESULT CALLED TO, READ BACK BY AND VERIFIED WITH: TESS THOMAS,RN 09/17/2015 0631 BY JRS.    Culture   Final    ENTEROCOCCUS FAECALIS AEROBIC BOTTLE ONLY VRE HAVE INTRINSIC RESISTANCE TO MOST COMMONLY USED ANTIBIOTICS AND THE ABILITY TO ACQUIRE RESISTANCE TO MOST AVAILABLE ANTIBIOTICS. CRITICAL RESULT CALLED TO, READ BACK BY AND VERIFIED WITH: CHERYL SMITH AT [id number] 09/19/15 DV    Report Status 09/21/2015 FINAL  Final   Organism ID, Bacteria ENTEROCOCCUS FAECALIS  Final      Susceptibility   Enterococcus faecalis - MIC*    AMPICILLIN <=2 SENSITIVE Sensitive     LINEZOLID 2 SENSITIVE Sensitive     CIPROFLOXACIN Value in next row Resistant      RESISTANT>=8    TETRACYCLINE Value in next row Resistant      RESISTANT>=16    VANCOMYCIN Value in next row Resistant      RESISTANT>=32    GENTAMICIN SYNERGY Value in next row Resistant      RESISTANT>=32    * ENTEROCOCCUS FAECALIS  Culture, respiratory (NON-Expectorated)  Status: None   Collection Time: 09/16/15  3:50 PM  Result Value Ref Range Status   Specimen Description TRACHEAL ASPIRATE  Final   Special Requests Immunocompromised  Final   Gram Stain   Final    FEW WBC SEEN GOOD SPECIMEN - 80-90% WBCS RARE GRAM NEGATIVE RODS    Culture LIGHT GROWTH PSEUDOMONAS AERUGINOSA  Final   Report Status 09/19/2015 FINAL  Final   Organism ID, Bacteria PSEUDOMONAS AERUGINOSA  Final      Susceptibility   Pseudomonas aeruginosa - MIC*    CEFTAZIDIME 8 SENSITIVE Sensitive     CIPROFLOXACIN 2 INTERMEDIATE Intermediate     GENTAMICIN >=16 RESISTANT Resistant     IMIPENEM >=16 RESISTANT Resistant     PIP/TAZO Value in next row Sensitive      SENSITIVE32    CEFEPIME Value in next row Sensitive       SENSITIVE8    LEVOFLOXACIN Value in next row Resistant      RESISTANT>=8    * LIGHT GROWTH PSEUDOMONAS AERUGINOSA    Medical History: Past Medical History  Diagnosis Date  . Obesity   . Dyslipidemia   . Hypertension   . Coronary artery disease     s/p BMS 2010 LAD  . Dysrhythmia     ventricular tachycardia resolved after LAD stent and beta blocker  . Diabetes mellitus     with retinopathy, neuropathy and microalbuminemia  . ESRD (end stage renal disease) on dialysis     Assessment: 49 yo female admitted on Sep 02, 2015 with urosepsis and  infected sacral decubitus ulcer. Currently patient is being treated for 1 of 2 positive blood cultures growing VRE from 10/28. Dr. Sampson Goon requested daptomycin be initiated on patient.  She is an MWF HD patient. Daptomycin /kg every 48 hours was initiated.   WBC 11.7 (up from 9.5), CrCl 65, Scr 1.26 (up from 1.02), CPK 10.   Plan:  Will continue treatment with daptomycin  every 48 hours. Pharmacy will continue to monitor labs and renal function and make adjustments as needed. Recommend checking for increase in CPK levels at least once a week.   Cher Nakai, PharmD Pharmacy Resident

## 2015-09-21 NOTE — Progress Notes (Signed)
Nutrition Follow-up  DOCUMENTATION CODES:   Severe malnutrition in context of acute illness/injury  INTERVENTION:   Coordination of Care: continue calorie count for at least 48 hours. TF to remains on hold during this period. If pt continues to refuse po intake, recommend restarting TF when calorie count complete (likely within 48-72 hours). Nursing to continue to encourage po intake at meal times but also between meals, will send snacks from kitchen between meals, will also send extra nectar thick juices to pt room. Order placed for staff to offer and encourage po intake around the clock when pt awake. Discussed with Dione Housekeeper and during ICU rounds Meals and Snacks: Cater to patient preferences; attempted to discuss preferences with pt this AM. Pt's eyes opened when writer entered the room. When Clinical research associate introduced herself, pt closed her eyes and would not Printmaker or participate. Pt did not open her eyes for the remainder of the attempted conversation but pt shaking head "no" continuously. Unable to obtain preferences at that time. Will order snacks between meals that are compliant with current diet order based on preferences that were previously discussed with husband.    NUTRITION DIAGNOSIS:   Inadequate oral intake related to wound healing, acute illness, chronic illness as evidenced by NPO status, estimated needs. Continues  GOAL:   Patient will meet greater than or equal to 90% of their needs   MONITOR:    (Energy Intake, Anthropometrics, Digestive System, Electrolyte/Renal Profile, Glucose Profile)  REASON FOR ASSESSMENT:   Consult Enteral/tube feeding initiation and management  ASSESSMENT:    Pt receiving dialysis this AM; pt started on D10 infusion due to hypoglycemia post discontinuation of TF. Pt alert with eyes open upon entering the room this AM. Once RD introduced herself, pt closed her eyes and would not participate further, would not open eyes. Pt just  continuously shook her head back and forth "no"  Diet Order:  DIET - DYS 1 Room service appropriate?: Yes; Fluid consistency:: Nectar Thick   Energy Intake: pt did not eat lunch yesterday, pt took only 1/2 teaspoon of mashed potatoes and 1/2 teaspoon of juice at dinner last night despite encouragement from nurse and husband. Husband was present at dinner last night, per Dione Housekeeper, husband even tried to eat some of the food to show pt encouragement. Pt also refused breakfast this AM. PO intake thus far has been negligible.   Skin:   (stage IV sacrum, stage III hip, unstageable foot; rectal ulcer from fleixseal, ulcer in nose from dobhoff)   Digestive System: loose yellow stool continues with last BM documented around shift change this AM; 2-3 BMs in past 24 hours per documentation; dobhoff remains in place  Electrolyte and Renal Profile:  Recent Labs Lab 09/14/15 1933  09/18/15 0554 09/20/15 0436 09/21/15 0459  BUN  --   < > 46* 37* 46*  CREATININE  --   < > 1.30* 1.02* 1.26*  NA  --   < > 135 139 141  K  --   < > 3.6 3.6 3.7  PHOS 3.1  --   --   --   --   < > = values in this interval not displayed. Glucose Profile:  Recent Labs  09/21/15 0629 09/21/15 0749 09/21/15 0750  GLUCAP 84 59* 63*   Meds: D10 at 40 ml/hr (326 kcals), MVI  Height:   Ht Readings from Last 1 Encounters:  08-13-15  (1.753 m)    Weight:   Wt Readings from  Last 1 Encounters:  09/21/15 207 lb 3.7 oz (94 kg)    Filed Weights   09/20/15 2000 09/21/15 0500 09/21/15 0900  Weight: 211 lb 10.3 oz (96 kg) 207 lb 3.7 oz (94 kg) 207 lb 3.7 oz (94 kg)    BMI:  Body mass index is 30.59 kg/(m^2).  Estimated Nutritional Needs:   Kcal:  2175-2535 kcals (BEE 1509, 1.2 AF, 1.2-1.4 IF) or 2490-2905 kcals (30-35 kcals/kg)   Protein:  125-166 g (1.5-2.0 g/kg) but likely closer to 166-208 g (2.0-2.5 g/kg)   Fluid:  1000 mL plus UOP  EDUCATION NEEDS:   No education needs identified at this  time  HIGH Care Level  Romelle Starcherate Shahiem Bedwell MS, RD, LDN [phone] Pager

## 2015-09-21 NOTE — Progress Notes (Signed)
Patient on hemodialysis unable to feed lunch at this time

## 2015-09-21 NOTE — Care Management (Signed)
Patient required 2 doses of D50 last pm for low blood sugar of 58 and 68. To be started on D10 continuous.  First 24 hours of calorie count is showing that patient is declining to take oral nutrition.  Turns her head, not opening mouth.  Husband could not persuade to eat.

## 2015-09-21 NOTE — Progress Notes (Signed)
Subjective:    Tracheal aspirate= pseudomonas, VRE in blood. ID following Patient remains critically ill Did not wake up to interact today No acute events    Objective:  Vital signs in last 24 hours:  Temp:  [98.4 F (36.9 C)-99.1 F (37.3 C)] 98.4 F (36.9 C) (11/02 0731) Pulse Rate:  [103-126] 104 (11/02 0600) Resp:  [0-34] 15 (11/02 0600) BP: (133-164)/(73-105) 146/105 mmHg (11/02 0600) SpO2:  [95 %-100 %] 97 % (11/02 0600) FiO2 (%):  [28 %] 28 % (11/02 0600) Weight:  [94 kg (207 lb 3.7 oz)-96 kg (211 lb 10.3 oz)] 94 kg (207 lb 3.7 oz) (11/02 0500)  Weight change: -1 kg (-2 lb 3.3 oz) Filed Weights   09/19/15 1548 09/20/15 2000 09/21/15 0500  Weight: 97 kg (213 lb 13.5 oz) 96 kg (211 lb 10.3 oz) 94 kg (207 lb 3.7 oz)    Intake/Output: I/O last 3 completed shifts: In: 1590.4 [NG/GT:829.2; IV Piggyback:761.2] Out: 0    Intake/Output this shift:     Physical Exam: General: No acute distress, critically ill  Head: [name]/AT eyes open, tracks in room, NG in place  Eyes: anicteric  Neck: Trach in place  Lungs:  Scattered rhonchi, normal effort  Heart: S1S2 +tachycardia  Abdomen:  Soft, nontender, BS present  Extremities: + dependent edema  Neurologic: Eyes closed. Did not interact today  Access:  R IJ permcath.       Basic Metabolic Panel:  Recent Labs Lab 09/14/15 1933  09/16/15 0424 09/16/15 0530 09/18/15 0554 09/20/15 0436 09/21/15 0459  NA  --   --  138 141 135 139 141  K  --   --  3.8 3.6 3.6 3.6 3.7  CL  --   --  101 102 98* 101 102  CO2  --   --  31 33* 31 32 32  GLUCOSE  --   --  146* 67 305* 164* 69  BUN  --   --  62* 44* 46* 37* 46*  CREATININE  --   --  1.25* 1.01* 1.30* 1.02* 1.26*  CALCIUM  --   < > 8.0* 7.9* 7.6* 7.7* 8.1*  PHOS 3.1  --   --   --   --   --   --   < > = values in this interval not displayed.  Liver Function Tests:  Recent Labs Lab 09/16/15 0424 09/16/15 0530 09/18/15 0554  AST 19 33 28  ALT 13* 20 20  ALKPHOS  1008* 1486* 1487*  BILITOT 0.5 0.8 0.6  PROT 4.4* 4.2* 4.1*  ALBUMIN 1.5* 1.8* 1.6*   No results for input(s): LIPASE, AMYLASE in the last 168 hours. No results for input(s): AMMONIA in the last 168 hours.  CBC:  Recent Labs Lab 09/16/15 0424 09/16/15 0530 09/18/15 0554 09/20/15 0436 09/21/15 0459  WBC 18.1* 11.9* 9.9 9.5 11.7*  NEUTROABS 15.8* 9.7*  --   --   --   HGB 8.2* 7.5* 7.2* 7.2* 7.6*  HCT 25.4* 24.0* 23.0* 23.4* 24.7*  MCV 101.5* 101.8* 102.6* 103.8* 104.1*  PLT 236 194 205 194 207    Cardiac Enzymes: No results for input(s): CKTOTAL, CKMB, CKMBINDEX, TROPONINI in the last 168 hours.  BNP: Invalid input(s): POCBNP  CBG:  Recent Labs Lab 09/21/15 0023 09/21/15 0024 09/21/15 0103 09/21/15 0603 09/21/15 0629  GLUCAP 63* 68 86 58* 84    Microbiology: Results for orders placed or performed during the hospital encounter of 2015/09/09  Blood Culture (routine x  2)     Status: None   Collection Time: 08/16/2015  8:51 AM  Result Value Ref Range Status   Specimen Description BLOOD [name]  Final   Special Requests BOTTLES DRAWN AEROBIC AND ANAEROBIC  3CC  Final   Culture NO GROWTH 5 DAYS  Final   Report Status 08/17/2015 FINAL  Final  Blood Culture (routine x 2)     Status: None   Collection Time: 08/11/2015  9:20 AM  Result Value Ref Range Status   Specimen Description BLOOD LEFT ARM  Final   Special Requests BOTTLES DRAWN AEROBIC AND ANAEROBIC  1CC  Final   Culture NO GROWTH 5 DAYS  Final   Report Status 08/17/2015 FINAL  Final  Wound culture     Status: None   Collection Time: 08/02/2015  9:20 AM  Result Value Ref Range Status   Specimen Description DECUBITIS  Final   Special Requests Normal  Final   Gram Stain   Final    FEW WBC SEEN MANY GRAM NEGATIVE RODS RARE GRAM POSITIVE COCCI    Culture   Final    HEAVY GROWTH ESCHERICHIA COLI MODERATE GROWTH ENTEROBACTER AEROGENES PROTEUS MIRABILIS HEAVY GROWTH ENTEROCOCCUS SPECIES VRE HAVE INTRINSIC RESISTANCE  TO MOST COMMONLY USED ANTIBIOTICS AND THE ABILITY TO ACQUIRE RESISTANCE TO MOST AVAILABLE ANTIBIOTICS.    Report Status 08/16/2015 FINAL  Final   Organism ID, Bacteria ESCHERICHIA COLI  Final   Organism ID, Bacteria ENTEROBACTER AEROGENES  Final   Organism ID, Bacteria PROTEUS MIRABILIS  Final   Organism ID, Bacteria ENTEROCOCCUS SPECIES  Final      Susceptibility   Enterobacter aerogenes - MIC*    CEFTAZIDIME <=1 SENSITIVE Sensitive     CEFAZOLIN >=64 RESISTANT Resistant     CEFTRIAXONE <=1 SENSITIVE Sensitive     CIPROFLOXACIN <=0.25 SENSITIVE Sensitive     GENTAMICIN <=1 SENSITIVE Sensitive     IMIPENEM 1 SENSITIVE Sensitive     TRIMETH/SULFA <=20 SENSITIVE Sensitive     * MODERATE GROWTH ENTEROBACTER AEROGENES   Escherichia coli - MIC*    AMPICILLIN <=2 SENSITIVE Sensitive     CEFTAZIDIME <=1 SENSITIVE Sensitive     CEFAZOLIN <=4 SENSITIVE Sensitive     CEFTRIAXONE <=1 SENSITIVE Sensitive     CIPROFLOXACIN <=0.25 SENSITIVE Sensitive     GENTAMICIN <=1 SENSITIVE Sensitive     IMIPENEM <=0.25 SENSITIVE Sensitive     TRIMETH/SULFA <=20 SENSITIVE Sensitive     * HEAVY GROWTH ESCHERICHIA COLI   Proteus mirabilis - MIC*    AMPICILLIN >=32 RESISTANT Resistant     CEFTAZIDIME <=1 SENSITIVE Sensitive     CEFAZOLIN 8 SENSITIVE Sensitive     CEFTRIAXONE <=1 SENSITIVE Sensitive     CIPROFLOXACIN <=0.25 SENSITIVE Sensitive     GENTAMICIN <=1 SENSITIVE Sensitive     IMIPENEM 1 SENSITIVE Sensitive     TRIMETH/SULFA <=20 SENSITIVE Sensitive     * PROTEUS MIRABILIS   Enterococcus species - MIC*    AMPICILLIN >=32 RESISTANT Resistant     VANCOMYCIN >=32 RESISTANT Resistant     GENTAMICIN SYNERGY SENSITIVE Sensitive     TETRACYCLINE Value in next row Resistant      RESISTANT>=16    * HEAVY GROWTH ENTEROCOCCUS SPECIES  MRSA PCR Screening     Status: None   Collection Time: 08/05/2015  2:38 PM  Result Value Ref Range Status   MRSA by PCR NEGATIVE NEGATIVE Final    Comment:        The  GeneXpert  MRSA Assay (FDA approved for NASAL specimens only), is one component of a comprehensive MRSA colonization surveillance program. It is not intended to diagnose MRSA infection nor to guide or monitor treatment for MRSA infections.   Blood culture (single)     Status: None   Collection Time: September 06, 2015  3:36 PM  Result Value Ref Range Status   Specimen Description BLOOD RIGHT ASSIST CONTROL  Final   Special Requests BOTTLES DRAWN AEROBIC AND ANAEROBIC  Final   Culture NO GROWTH 5 DAYS  Final   Report Status 08/17/2015 FINAL  Final  Wound culture     Status: None   Collection Time: 08/13/15 12:37 PM  Result Value Ref Range Status   Specimen Description WOUND  Final   Special Requests Normal  Final   Gram Stain   Final    FEW WBC SEEN TOO NUMEROUS TO COUNT GRAM NEGATIVE RODS FEW GRAM POSITIVE COCCI    Culture   Final    HEAVY GROWTH ESCHERICHIA COLI MODERATE GROWTH PROTEUS MIRABILIS LIGHT GROWTH KLEBSIELLA PNEUMONIAE MODERATE GROWTH ENTEROCOCCUS GALLINARUM CRITICAL RESULT CALLED TO, READ BACK BY AND VERIFIED WITH: [facility] BORBA AT 1042 08/16/15 DV    Report Status 08/17/2015 FINAL  Final   Organism ID, Bacteria ESCHERICHIA COLI  Final   Organism ID, Bacteria PROTEUS MIRABILIS  Final   Organism ID, Bacteria KLEBSIELLA PNEUMONIAE  Final   Organism ID, Bacteria ENTEROCOCCUS GALLINARUM  Final      Susceptibility   Escherichia coli - MIC*    AMPICILLIN >=32 RESISTANT Resistant     CEFTAZIDIME 4 RESISTANT Resistant     CEFAZOLIN >=64 RESISTANT Resistant     CEFTRIAXONE 16 RESISTANT Resistant     GENTAMICIN 2 SENSITIVE Sensitive     IMIPENEM >=16 RESISTANT Resistant     TRIMETH/SULFA <=20 SENSITIVE Sensitive     Extended ESBL POSITIVE Resistant     PIP/TAZO Value in next row Resistant      RESISTANT>=128    CIPROFLOXACIN Value in next row Sensitive      SENSITIVE<=0.25    * HEAVY GROWTH ESCHERICHIA COLI   Klebsiella pneumoniae - MIC*    AMPICILLIN Value in next  row Resistant      SENSITIVE<=0.25    CEFTAZIDIME Value in next row Resistant      SENSITIVE<=0.25    CEFAZOLIN Value in next row Resistant      SENSITIVE<=0.25    CEFTRIAXONE Value in next row Resistant      SENSITIVE<=0.25    CIPROFLOXACIN Value in next row Resistant      SENSITIVE<=0.25    GENTAMICIN Value in next row Sensitive      SENSITIVE<=0.25    IMIPENEM Value in next row Resistant      SENSITIVE<=0.25    TRIMETH/SULFA Value in next row Resistant      SENSITIVE<=0.25    PIP/TAZO Value in next row Resistant      RESISTANT>=128    * LIGHT GROWTH KLEBSIELLA PNEUMONIAE   Proteus mirabilis - MIC*    AMPICILLIN Value in next row Resistant      RESISTANT>=128    CEFTAZIDIME Value in next row Sensitive      RESISTANT>=128    CEFAZOLIN Value in next row Sensitive      RESISTANT>=128    CEFTRIAXONE Value in next row Sensitive      RESISTANT>=128    CIPROFLOXACIN Value in next row Sensitive      RESISTANT>=128    GENTAMICIN Value in next row Sensitive  RESISTANT>=128    IMIPENEM Value in next row Sensitive      RESISTANT>=128    TRIMETH/SULFA Value in next row Sensitive      RESISTANT>=128    PIP/TAZO Value in next row Sensitive      SENSITIVE<=4    * MODERATE GROWTH PROTEUS MIRABILIS   Enterococcus gallinarum - MIC*    AMPICILLIN Value in next row Resistant      SENSITIVE<=4    GENTAMICIN SYNERGY Value in next row Sensitive      SENSITIVE<=4    CIPROFLOXACIN Value in next row Resistant      RESISTANT>=8    TETRACYCLINE Value in next row Resistant      RESISTANT>=16    * MODERATE GROWTH ENTEROCOCCUS GALLINARUM  Wound culture     Status: None   Collection Time: 08/15/15  2:53 PM  Result Value Ref Range Status   Specimen Description WOUND  Final   Special Requests NONE  Final   Gram Stain FEW WBC SEEN NO ORGANISMS SEEN   Final   Culture NO GROWTH 3 DAYS  Final   Report Status 08/18/2015 FINAL  Final  C difficile quick scan w PCR reflex     Status: None    Collection Time: 08/17/15 11:34 AM  Result Value Ref Range Status   C Diff antigen NEGATIVE NEGATIVE Final   C Diff toxin NEGATIVE NEGATIVE Final   C Diff interpretation Negative for C. difficile  Final  Stool culture     Status: None   Collection Time: 09/03/15  3:51 PM  Result Value Ref Range Status   Specimen Description STOOL  Final   Special Requests Immunocompromised  Final   Culture   Final    NO SALMONELLA OR SHIGELLA ISOLATED No Pathogenic E. coli detected NO CAMPYLOBACTER DETECTED    Report Status 09/07/2015 FINAL  Final  C difficile quick scan w PCR reflex     Status: None   Collection Time: 09/13/15 12:51 AM  Result Value Ref Range Status   C Diff antigen NEGATIVE NEGATIVE Final   C Diff toxin NEGATIVE NEGATIVE Final   C Diff interpretation Negative for C. difficile  Final  Culture, blood (routine x 2)     Status: None (Preliminary result)   Collection Time: 09/16/15 11:24 AM  Result Value Ref Range Status   Specimen Description BLOOD LEFT HAND  Final   Special Requests BOTTLES DRAWN AEROBIC AND ANAEROBIC  1CC  Final   Culture NO GROWTH 4 DAYS  Final   Report Status PENDING  Incomplete  Culture, blood (routine x 2)     Status: None   Collection Time: 09/16/15 12:23 PM  Result Value Ref Range Status   Specimen Description BLOOD RIGHT HAND  Final   Special Requests BOTTLES DRAWN AEROBIC AND ANAEROBIC  1CC  Final   Culture  Setup Time   Final    GRAM POSITIVE COCCI AEROBIC BOTTLE ONLY CRITICAL RESULT CALLED TO, READ BACK BY AND VERIFIED WITH: TESS THOMAS,RN 09/17/2015 0631 BY JRS.    Culture   Final    ENTEROCOCCUS FAECALIS AEROBIC BOTTLE ONLY VRE HAVE INTRINSIC RESISTANCE TO MOST COMMONLY USED ANTIBIOTICS AND THE ABILITY TO ACQUIRE RESISTANCE TO MOST AVAILABLE ANTIBIOTICS. CRITICAL RESULT CALLED TO, READ BACK BY AND VERIFIED WITH: [facility] AT 1610 09/19/15 DV    Report Status 09/21/2015 FINAL  Final   Organism ID, Bacteria ENTEROCOCCUS FAECALIS  Final       Susceptibility   Enterococcus faecalis - MIC*  AMPICILLIN <=2 SENSITIVE Sensitive     LINEZOLID 2 SENSITIVE Sensitive     CIPROFLOXACIN Value in next row Resistant      RESISTANT>=8    TETRACYCLINE Value in next row Resistant      RESISTANT>=16    VANCOMYCIN Value in next row Resistant      RESISTANT>=32    GENTAMICIN SYNERGY Value in next row Resistant      RESISTANT>=32    * ENTEROCOCCUS FAECALIS  Culture, respiratory (NON-Expectorated)     Status: None   Collection Time: 09/16/15  3:50 PM  Result Value Ref Range Status   Specimen Description TRACHEAL ASPIRATE  Final   Special Requests Immunocompromised  Final   Gram Stain   Final    FEW WBC SEEN GOOD SPECIMEN - 80-90% WBCS RARE GRAM NEGATIVE RODS    Culture LIGHT GROWTH PSEUDOMONAS AERUGINOSA  Final   Report Status 09/19/2015 FINAL  Final   Organism ID, Bacteria PSEUDOMONAS AERUGINOSA  Final      Susceptibility   Pseudomonas aeruginosa - MIC*    CEFTAZIDIME 8 SENSITIVE Sensitive     CIPROFLOXACIN 2 INTERMEDIATE Intermediate     GENTAMICIN >=16 RESISTANT Resistant     IMIPENEM >=16 RESISTANT Resistant     PIP/TAZO Value in next row Sensitive      SENSITIVE32    CEFEPIME Value in next row Sensitive      SENSITIVE8    LEVOFLOXACIN Value in next row Resistant      RESISTANT>=8    * LIGHT GROWTH PSEUDOMONAS AERUGINOSA    Coagulation Studies: No results for input(s): LABPROT, INR in the last 72 hours.  Urinalysis: No results for input(s): COLORURINE, LABSPEC, PHURINE, GLUCOSEU, HGBUR, BILIRUBINUR, KETONESUR, PROTEINUR, UROBILINOGEN, NITRITE, LEUKOCYTESUR in the last 72 hours.  Invalid input(s): APPERANCEUR    Imaging: No results found.   Medications:     . antiseptic oral rinse  7 mL Mouth Rinse q12n4p  . chlorhexidine  15 mL Mouth Rinse BID  . DAPTOmycin (CUBICIN)  IV  560 mg Intravenous Q48H  . epoetin (EPOGEN/PROCRIT) injection  10,000 Units Intravenous Q M,W,F-HD  . folic acid  1 mg Oral Daily  .  heparin subcutaneous  5,000 Units Subcutaneous Q12H  . hydrocortisone  10 mg Per Tube BID  . lidocaine  1 patch Transdermal Q24H  . midodrine  5 mg Per Tube TID WC  . multivitamin  5 mL Oral Daily  . nystatin   Topical TID  . sodium chloride  10-40 mL Intracatheter Q12H  . sodium hypochlorite   Irrigation BID   sodium chloride, sodium chloride, acetaminophen (TYLENOL) oral liquid 160 mg/5 mL, HYDROmorphone (DILAUDID) injection, ipratropium-albuterol, morphine injection, oxyCODONE, sodium chloride  Assessment/ Plan:  49 y.o. 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 Admitted on 07/22/2015  1. End-stage renal disease on hemodialysis on HD MWF. The patient has been on dialysis since October of 2014. R IJ permcath.   - Plan on continuing hemodialysis on MWF schedule.  - midodine and albumin with treatment.   - We have  also used sodium modeling with treatments.  - Dialysis today. Goal UF 1000 cc  2. Anemia of CKD: pt continues to require periodic blood transfusions, hemoglobin of 7.2 yesterday.  - epo has not been as effective as hoped secondary due to chronic inflammation from decubitus ulcer/infection   continue epogen with HD. This has been ordered as a standing order.   3. Secondary hyperparathyroidism: PTH low at 55 - phos was acceptable at 3.1 - no binders  4. ID: VRE in blood Pseudomonas in tracheal  aspirate Treatment as per ID and ICU team  5. Generalized Dependent edema due to 3rd spacing - UF goal 1 kg with dialysis    LOS: 40 Courtney [name] 11/2/20168:30 AM

## 2015-09-21 NOTE — Progress Notes (Signed)
I attempted to feed patient. I called her name and told her it was time for breakfast. She shook her head no. I asked if she is ready to eat and she did not respond to me. I asked her to open her eyes and she would not. Unable to feed her due to she would not open her eyes and participate in conversation. Nurse Tech was in the room and I asked her to talk to patient she called patient's name and she opened her eyes. Nurse Tech asked her if she was ready to eat and she closed her eyes and would not open them.

## 2015-09-22 LAB — GLUCOSE, CAPILLARY
GLUCOSE-CAPILLARY: 143 mg/dL — AB (ref 65–99)
GLUCOSE-CAPILLARY: 79 mg/dL (ref 65–99)
GLUCOSE-CAPILLARY: 94 mg/dL (ref 65–99)

## 2015-09-22 MED ORDER — IPRATROPIUM-ALBUTEROL 0.5-2.5 (3) MG/3ML IN SOLN
3.0000 mL | RESPIRATORY_TRACT | Status: DC
  Administered 2015-09-22 – 2015-09-23 (×8): 3 mL via RESPIRATORY_TRACT
  Filled 2015-09-22 (×8): qty 3

## 2015-09-22 MED ORDER — MEGESTROL ACETATE 400 MG/10ML PO SUSP
400.0000 mg | Freq: Two times a day (BID) | ORAL | Status: DC
  Administered 2015-09-22 – 2015-10-10 (×37): 400 mg via ORAL
  Filled 2015-09-22 (×36): qty 10

## 2015-09-22 MED ORDER — FREE WATER
20.0000 mL | Status: DC
  Administered 2015-09-22 – 2015-12-06 (×429): 20 mL

## 2015-09-22 MED ORDER — VITAL AF 1.2 CAL PO LIQD
1000.0000 mL | ORAL | Status: DC
Start: 2015-09-22 — End: 2015-10-09
  Administered 2015-09-22 – 2015-10-08 (×13): 1000 mL

## 2015-09-22 MED ORDER — ACETYLCYSTEINE 20 % IN SOLN
3.0000 mL | RESPIRATORY_TRACT | Status: DC
  Administered 2015-09-22 (×2): 3 mL via RESPIRATORY_TRACT
  Administered 2015-09-22: 12:00:00 via RESPIRATORY_TRACT
  Administered 2015-09-22 – 2015-09-23 (×3): 4 mL via RESPIRATORY_TRACT
  Administered 2015-09-23: 3 mL via RESPIRATORY_TRACT
  Administered 2015-09-23: 4 mL via RESPIRATORY_TRACT
  Filled 2015-09-22 (×8): qty 4

## 2015-09-22 MED ORDER — PRO-STAT SUGAR FREE PO LIQD
30.0000 mL | ORAL | Status: DC
  Administered 2015-09-22 – 2015-10-10 (×103): 30 mL

## 2015-09-22 MED ORDER — VITAMIN B-1 100 MG PO TABS
100.0000 mg | ORAL_TABLET | Freq: Every day | ORAL | Status: DC
  Administered 2015-09-22: 100 mg via ORAL
  Filled 2015-09-22: qty 1

## 2015-09-22 MED ORDER — THIAMINE HCL 100 MG/ML IJ SOLN
100.0000 mg | Freq: Every day | INTRAMUSCULAR | Status: DC
  Administered 2015-09-23 – 2015-09-26 (×4): 100 mg via INTRAVENOUS
  Filled 2015-09-22 (×4): qty 2

## 2015-09-22 NOTE — Progress Notes (Signed)
Calorie Count Note  Diet: Dysphagia I, Nectar Thick Supplements: Sugar Free Mighty Shakes TID with meals, Magic Cup TID with meals Snacks: Yogurt and Puree Egg Salad at 10am, Pudding and Puree Chicken Salad at 3pm and 8pm  09/20/2015 Tuesday Breakfast: pt NPO and receiving TF, no meal tray yet Lunch : None Dinner: 1/2 teaspoon mashed potatoes, 1/2 teaspoon nectar apple juice Supplements: None  09/21/2015 Wednesday Breakfast: None  Lunch: None Dinner: 1/4 tsp apple juice Supplements: None  Snacks: None  09/22/2015 Thursday Breakfast:  None Lunch: None Snacks: None Supplements: None  Total intake over a 48-hour period: Calories: <50 kcals per day but nutrition intake essentially negligible or unmeasurable Protein: None   Pt meeting 0% of calorie and protein needs and taking essentially nothing by mouth  Pt does not demonstrate any interest in eating orally. Pt will not cooperate with regards to obtaining food preferences (previously preferences were discussed with pt). Pt closes eyes at meal times, will not open mouth, does not attempt to eat despite encouragement from nursing staff.   Pt is unable to meet nutritional needs orally; if aggressive intervention is warranted, recommend alternative means for nutrition such as a feeding tube.   Romelle Starcherate Leovardo Thoman MS, RD, LDN [phone] Pager

## 2015-09-22 NOTE — Progress Notes (Signed)
Physical Therapy Treatment Patient Details Name: Courtney [name] MRN: [id number] DOB: 22-Jun-1966 Today's Date: 09/22/2015    History of Present Illness Pt is a [age] F who has been in medical facilities (hosp, LTAC, rehab) for 2 yrs following gastric bypass surgery with multiple complications. Now with chronic trach, ESRD, profound debilitation, severe sacral pressure ulcer (stage IV). Was seemingly making progress and transferred to rehab facility approx one week prior to this admission. She [facility] ED [facility]/23 with AMS and hypotension. Admitted with sepsis related to infected sacral ulcer.  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  Patient transferred to CCU to med-surg floor during hospitalization, but experienced profound interdialytic hypotension requiring return transfer to CCU for use of pressors (now off).  New orders received to resume PT; cleared by RN for participation as tolerated.  Noted with R hip intertrochanteric fracture, recommended for conservative management per orthopedics.    PT Comments    Patient remains profoundly limited by multiple medical conditions, and inconsistently participates with skilled PT activities.  Provided patient continues to demonstrate some degree of active effort, skilled PT will continue with emphasis on bilat UE and L LE ROM for strengthening (as able), contracture prevention and prevention of further skin breakdown.  Will closely monitor response to and participation with treatment and overall medical condition to ensure appropriateness of continued PT efforts. Will continue PT POC x2 additional weeks with treatment frequency 2-6x/week as tolerated and medically appropriate.  Goal updated to reflect current PT efforts and inherent restrictions.   Follow Up Recommendations  LTACH;SNF      Equipment Recommendations       Recommendations for Other Services       Precautions / Restrictions Precautions Precautions: Fall Precaution Comments: stage 4 sacral ulcer, NG tube/NPO, trach, contact isolation, R IJ permcath, L IJ central line, log roll only (as needed for dressing changes/hygiene) and no ROM to R LE/no attempts at sitting Restrictions Weight Bearing Restrictions: No    Mobility  Bed Mobility                  Transfers                    Ambulation/Gait                 Stairs            Wheelchair Mobility    Modified Rankin (Stroke Patients Only)       Balance                                    Cognition                            Exercises      General Comments        Pertinent Vitals/Pain      Home Living                      Prior Function            PT Goals (current goals can now be found in the care plan section) Acute Rehab PT Goals Patient Stated Goal: patient unable/refusing to verbalize PT Goal Formulation: Patient unable to participate  in goal setting Time For Goal Achievement: 10/06/15 Potential to Achieve Goals: Poor    Frequency  Min 2X/week    PT Plan      Co-evaluation             End of Session           Time:  -     Charges:                       G Codes:      Doneisha Ivey H. Manson Passey, PT, DPT, NCS 09/22/2015, 11:12 AM [phone]

## 2015-09-22 NOTE — Progress Notes (Signed)
ANTIBIOTIC CONSULT NOTE - Follow Up  Pharmacy Consult for daptomycin  Indication: Bacteremia   Allergies  Allergen Reactions  . Contrast Media [Iodinated Diagnostic Agents] Anaphylaxis  . Ampicillin Rash    Patient Measurements: Height:  (175.3 cm) Weight: 202 lb 13.2 oz (92 kg) IBW/kg (Calculated) : 66.2  Vital Signs: Temp: 98.6 F (37 C) (11/03 1200) Temp Source: Oral (11/03 1200) BP: 129/87 mmHg (11/03 1200) Pulse Rate: 114 (11/03 1200) Intake/Output from previous day: 11/02 0701 - 11/03 0700 In: 1125 [I.V.:1125] Out: 1000  Intake/Output from this shift: Total I/O In: 310 [I.V.:250; NG/GT:60] Out: -   Labs:  Recent Labs  09/20/15 0436 09/21/15 0459  WBC 9.5 11.7*  HGB 7.2* 7.6*  PLT 194 207  CREATININE 1.02* 1.26*   Estimated Creatinine Clearance: 64.5 mL/min (by C-G formula based on Cr of 1.26). No results for input(s): VANCOTROUGH, VANCOPEAK, VANCORANDOM, GENTTROUGH, GENTPEAK, GENTRANDOM, TOBRATROUGH, TOBRAPEAK, TOBRARND, AMIKACINPEAK, AMIKACINTROU, AMIKACIN in the last 72 hours.   Microbiology: Recent Results (from the past 720 hour(s))  Stool culture     Status: None   Collection Time: 09/03/15  3:51 PM  Result Value Ref Range Status   Specimen Description STOOL  Final   Special Requests Immunocompromised  Final   Culture   Final    NO SALMONELLA OR SHIGELLA ISOLATED No Pathogenic E. coli detected NO CAMPYLOBACTER DETECTED    Report Status 09/07/2015 FINAL  Final  C difficile quick scan w PCR reflex     Status: None   Collection Time: 09/13/15 12:51 AM  Result Value Ref Range Status   C Diff antigen NEGATIVE NEGATIVE Final   C Diff toxin NEGATIVE NEGATIVE Final   C Diff interpretation Negative for C. difficile  Final  Culture, blood (routine x 2)     Status: None   Collection Time: 09/16/15 11:24 AM  Result Value Ref Range Status   Specimen Description BLOOD LEFT HAND  Final   Special Requests BOTTLES DRAWN AEROBIC AND ANAEROBIC  1CC   Final   Culture NO GROWTH 5 DAYS  Final   Report Status 09/21/2015 FINAL  Final  Culture, blood (routine x 2)     Status: None   Collection Time: 09/16/15 12:23 PM  Result Value Ref Range Status   Specimen Description BLOOD RIGHT HAND  Final   Special Requests BOTTLES DRAWN AEROBIC AND ANAEROBIC  1CC  Final   Culture  Setup Time   Final    GRAM POSITIVE COCCI AEROBIC BOTTLE ONLY CRITICAL RESULT CALLED TO, READ BACK BY AND VERIFIED WITH: TESS THOMAS,RN 09/17/2015 0631 BY JRS.    Culture   Final    ENTEROCOCCUS FAECALIS AEROBIC BOTTLE ONLY VRE HAVE INTRINSIC RESISTANCE TO MOST COMMONLY USED ANTIBIOTICS AND THE ABILITY TO ACQUIRE RESISTANCE TO MOST AVAILABLE ANTIBIOTICS. CRITICAL RESULT CALLED TO, READ BACK BY AND VERIFIED WITH: CHERYL SMITH AT 1610 09/19/15 DV    Report Status 09/21/2015 FINAL  Final   Organism ID, Bacteria ENTEROCOCCUS FAECALIS  Final      Susceptibility   Enterococcus faecalis - MIC*    AMPICILLIN <=2 SENSITIVE Sensitive     LINEZOLID 2 SENSITIVE Sensitive     CIPROFLOXACIN Value in next row Resistant      RESISTANT>=8    TETRACYCLINE Value in next row Resistant      RESISTANT>=16    VANCOMYCIN Value in next row Resistant      RESISTANT>=32    GENTAMICIN SYNERGY Value in next row Resistant  RESISTANT>=32    * ENTEROCOCCUS FAECALIS  Culture, respiratory (NON-Expectorated)     Status: None   Collection Time: 09/16/15  3:50 PM  Result Value Ref Range Status   Specimen Description TRACHEAL ASPIRATE  Final   Special Requests Immunocompromised  Final   Gram Stain   Final    FEW WBC SEEN GOOD SPECIMEN - 80-90% WBCS RARE GRAM NEGATIVE RODS    Culture LIGHT GROWTH PSEUDOMONAS AERUGINOSA  Final   Report Status 09/19/2015 FINAL  Final   Organism ID, Bacteria PSEUDOMONAS AERUGINOSA  Final      Susceptibility   Pseudomonas aeruginosa - MIC*    CEFTAZIDIME 8 SENSITIVE Sensitive     CIPROFLOXACIN 2 INTERMEDIATE Intermediate     GENTAMICIN >=16 RESISTANT  Resistant     IMIPENEM >=16 RESISTANT Resistant     PIP/TAZO Value in next row Sensitive      SENSITIVE32    CEFEPIME Value in next row Sensitive      SENSITIVE8    LEVOFLOXACIN Value in next row Resistant      RESISTANT>=8    * LIGHT GROWTH PSEUDOMONAS AERUGINOSA    Medical History: Past Medical History  Diagnosis Date  . Obesity   . Dyslipidemia   . Hypertension   . Coronary artery disease     s/p BMS 2010 LAD  . Dysrhythmia     ventricular tachycardia resolved after LAD stent and beta blocker  . Diabetes mellitus     with retinopathy, neuropathy and microalbuminemia  . ESRD (end stage renal disease) on dialysis     Assessment: 49 yo female admitted on 2015/01/27 with urosepsis and  infected sacral decubitus ulcer. Currently patient is being treated for 1 of 2 positive blood cultures growing VRE from 10/28. Dr. [name] requested daptomycin be initiated on patient.  She is an MWF HD patient. Daptomycin 6mg /kg every 48 hours was initiated.    Plan:  Will continue treatment with daptomycin 500mg  every 48 hours. Pharmacy will continue to monitor labs and renal function and make adjustments as needed. Recommend checking for increase in CPK levels at least once a week.   Charlies SilversMichael Rory Montel, PharmD

## 2015-09-22 NOTE — Progress Notes (Signed)
Attempted to feed pt breakfast. Offered the pt OJ and cream of wheat, pt was not cooperative. Pt refused to open eyes and continuously shakes head "no". Offered food again 10 minutes later received the same response.

## 2015-09-22 NOTE — Progress Notes (Signed)
PT Cancellation Note  Patient Details Name: Courtney [name] MRN: [id number] DOB: 1966-03-26   Cancelled Treatment:    Reason Eval/Treat Not Completed: Patient declined, no reason specified (Patient maintains eyes closed throughout treatment attempt.  Persistently pulling L UE away from therapist, resisting all attempts at act assist/passive ROM to L UE and constantly shaking head "no" at all attempts.  Will re-attempt at later time/date as medically appropriate and patient agreeable to participation.)   Nalayah Hitt H. Manson PasseyBrown, PT, DPT, NCS 09/22/2015, 11:01 AM [phone]

## 2015-09-22 NOTE — Progress Notes (Signed)
Nutrition Follow-up  DOCUMENTATION CODES:   Severe malnutrition in context of acute illness/injury  INTERVENTION:   Coordination of Care: 48-hour calorie count completed after lunch meal today. Pt essentially eating nothing orally for the past 48-hours. Discussed resuming TF via dobhoff today with MD [name], MD agrees with this plan. This will also likely improve glucose levels so that D10 infusion can be discontinued. MD also agrees with continuing oral diet at this time. EN: recommend restarting Vital 1.2 AF at previous rate of 50 ml/hr with Prostat 6 times weekly; goal rate is 65 ml/hr.  Meals and Snacks: Continuing current diet order per MD; will continue snacks between meals and supplements. Encouragement of po intake to continue. Nursing staff to continue to document po intake in I/O flow sheet and progress notes despite calorie count being complete. Discussed with Summer RN and Clinical biochemistBritney RN.   NUTRITION DIAGNOSIS:   Inadequate oral intake related to wound healing, acute illness, chronic illness as evidenced by NPO status, estimated needs. Being addressed as restarting TF, continuing diet  GOAL:   Patient will meet greater than or equal to 90% of their needs  MONITOR:    (Energy Intake, Anthropometrics, Digestive System, Electrolyte/Renal Profile, Glucose Profile)  REASON FOR ASSESSMENT:   Consult Enteral/tube feeding initiation and management  ASSESSMENT:     Persistent hypoglycemia despite D10 infusion, D10 increased to 60 ml/hr; received HD yesterday with 1 L of fluid removed, next HD on Friday. Calorie count completed today  Diet Order:  DIET - DYS 1 Room service appropriate?: Yes; Fluid consistency:: Nectar Thick   Energy Intake: separate calorie count note entered but po intake essentially 0% of all meals, snacks, supplements.   Skin:   (stage IV sacrum, stage III hip, unstageable foot; rectal ulcer from fleixseal, ulcer in nose from dobhoff)  Digestive System:  +yellow loose stools; 2-3 in past 24 hours. Noted loose stools continue despite TF being on hold.   Electrolyte and Renal Profile:  Recent Labs Lab 09/18/15 0554 09/20/15 0436 09/21/15 0459  BUN 46* 37* 46*  CREATININE 1.30* 1.02* 1.26*  NA 135 139 141  K 3.6 3.6 3.7   Glucose Profile:  Recent Labs  09/21/15 1729 09/21/15 1829 09/22/15 0727  GLUCAP 64* 83 94   Meds: D10 at 60 ml/hr (490 kcals), other meds reviewed  Height:   Ht Readings from Last 1 Encounters:  2015/02/21 5\' 9"  (1.753 m)    Weight:   Wt Readings from Last 1 Encounters:  09/22/15 202 lb 13.2 oz (92 kg)    Filed Weights   09/21/15 0500 09/21/15 0900 09/22/15 0500  Weight: 207 lb 3.7 oz (94 kg) 207 lb 3.7 oz (94 kg) 202 lb 13.2 oz (92 kg)    BMI:  Body mass index is 29.94 kg/(m^2).  Estimated Nutritional Needs:   Kcal:  2175-2535 kcals (BEE 1509, 1.2 AF, 1.2-1.4 IF) or 2490-2905 kcals (30-35 kcals/kg)   Protein:  125-166 g (1.5-2.0 g/kg) but likely closer to 166-208 g (2.0-2.5 g/kg)   Fluid:  1000 mL plus UOP  EDUCATION NEEDS:   No education needs identified at this time  HIGH Care Level  Romelle Starcherate Hasini Peachey MS, RD, LDN [phone] Pager

## 2015-09-22 NOTE — Progress Notes (Signed)
[facility] [place] Critical Care Medicine Progess Note  Name: Courtney [name] MRN: [id number] DOB: [birth year]    ADMISSION DATE:  08/16/15     INITIAL PRESENTATION:  17 F who has been in medical facilities (hosp, LTAC, rehab) for 2 yrs following gastric bypass surgery with multiple complications. Now with chronic trach, ESRD, profound debilitation, severe sacral pressure ulcer. Was seemingly making progress and transferred to rehab facility approx one week prior to this admission. She was sent to [facility] ED with AMS and hypotension. Working dx of severe sepsis/septic shock due to infected sacral pressure ulcer. Since admission. Her course is been very complicated with numerous complications including septic shock and GI bleeding.  INTERVAL HISTORY: The patient was continued on her oral feeding trial, however, her nutrition intake orally appears to continue to be very poor. Nurses report that she nods her head "No" when offered food. Patient desatted yesterday after pulling off her trach collar, chest x-ray obtained at the time showed increasing left lower lobe atelectasis, she also appears to have some mild increased dyspnea with increased work of breathing. She was bagged yesterday with subsequent improvement. She has had increased secretions per nursing. She nods her head yes and no to questions. She indicates to me that she has no new complaints today.  MAJOR EVENTS/TEST RESULTS: Admission 02/07/14-05/07/14 Admission 07/21/14-09/06/14 Discharged to [facility]. Pt had palliative consult at that time, were asked to sign off by husband.  09/23 CT head: NAD 09/23 EEG: no epileptiform activity 09/23 PRBCs for Hgb 6.4 09/24 bedside debridement of sacral wound. Abscess drained 09/25 Off vasopressors. More alert. No distress. Worsening thrombocytopenia. Vanc DC'd 09/29  Dr. Sampson Goon (I.D) excused from the case by patient's husband. 10/02 MRI -multiple infarcts 10/03 tracheal bleeding- transfused  platelets 10/03 hospitalist service excused from the case by patient's husband 10/03 Echocardiogram ejection fraction was 55-60%, pulmonary systolic pressure was 39 mmHg 40/98 restart TF's at lower rate, attempt reg HD  10/12 Transferred to med-surg floor. Remains on PCCM service 10/14 SLP eval: pt unable to tolerate PMV adequately 10/17-will re-attempt PM valve-discussed with Speech therapist 10/18 passed swallow eval-start pureed thick foods no thin liquids-continue NG feeds 10/19 transferred to step down for sepsis/aspiration pneumonia 10/19 cxr shows RLL opacity 10/21 sacral decub debride at bedside by surgery 10/22 started back on vasopressors while on HD 10/27 CT with osteo, R hip fx, unable to identify tip of dubhoff tube - sent for fluoro study 10/28 Ortho consultation: I do not feel that she is a surgical candidate. Therefore, I feel that it would best to manage this fracture nonsurgically and allow it to heal by itself over time, which it should.  10/28 Gen Surg consultation: Due to the lack of free air or free flow of contrast and the peritoneum there is no indication for any surgical intervention on this. Would recommend pulling back the feeding tube 1-2 cm. Would recheck an abdominal film to confirm no pneumoperitoneum in the morning. Absence of any changes okay to continue using Dobbhoff for feeding and medications. 10/28 gastrograffin study: The study confirms that the feeding tube pes perforated through the duodenum and the tip is within a cavity that fills with injected contrast. 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 History of sepsis from C. glabrata MRSA PCR  9/23 >> NEG Wound (swab) 9/23 >> multiple organisms Wound  (debridement) 9/24 >> Enterococcus, K. Pneumoniae, P. Mirabilis, VRE Wound 9/26 >> No growth Blood 9/23 >> NEG CDiff 9/27>>neg Stool Cx 10/15>> negative Cdiff 10/25>>neg Trach Aspirate 10/28>> light growth pseudomonas Blood 10/28 >> 1/2 GPC >>   ANTIMICROBIALS:  Aztreonam 9/23 >> 9/24 Vanc 9/23 >> 9/25 Vanc 9/26>>9/27 Daptomycin 9/27>> 10/12, 10/28>>  Meropenem 9/23 >> 10/14, 10/28 >> 10/31   VITAL SIGNS: Temp:  [97.8 F (36.6 C)-98.8 F (37.1 C)] 98.8 F (37.1 C) (11/03 0800) Pulse Rate:  [103-123] 108 (11/03 1000) Resp:  [0-32] 21 (11/03 1000) BP: (118-144)/(51-92) 118/91 mmHg (11/03 1000) SpO2:  [89 %-100 %] 100 % (11/03 1000) FiO2 (%):  [35 %-40 %] 35 % (11/03 0800) Weight:  [92 kg (202 lb 13.2 oz)] 92 kg (202 lb 13.2 oz) (11/03 0500) HEMODYNAMICS:   VENTILATOR SETTINGS: Vent Mode:  [-]  FiO2 (%):  [35 %-40 %] 35 % INTAKE / OUTPUT:  Intake/Output Summary (Last 24 hours) at 09/22/15 1044 Last data filed at 09/22/15 1000  Gross per 24 hour  Intake 1268.33 ml  Output   1000 ml  Net 268.33 ml    PHYSICAL EXAMINATION: Physical Examination:   VS: BP 118/91 mmHg  Pulse 108  Temp(Src) 98.8 F (37.1 C) (Oral)  Resp 21  Ht  (1.753 m)  Wt 92 kg (202 lb 13.2 oz)  BMI 29.94 kg/m2  SpO2 100%  LMP  (LMP Unknown)  General Appearance: No resp distress, coarse upper airway sounds  Neuro: profoundly diffusely weak HEENT: cushingoid facies, PERRLA, EOM intact Neck: trach site clean Pulmonary: clear anteriorly Cardiovascular: reg, + syst murmur Abdomen: soft, NT, +BS Extremities: warm, R>L UE edema    LABORATORY PANEL:   CBC  Recent Labs Lab 09/21/15 0459  WBC 11.7*  HGB 7.6*  HCT 24.7*  PLT 207    Chemistries   Recent Labs Lab 09/18/15 0554  09/21/15 0459  NA 135  < > 141  K 3.6  < > 3.7  CL 98*  < > 102  CO2 31  < > 32  GLUCOSE 305*  < > 69  BUN 46*  < > 46*  CREATININE 1.30*  < > 1.26*  CALCIUM 7.6*  < > 8.1*  AST 28  --   --   ALT  20  --   --   ALKPHOS 1487*  --   --   BILITOT 0.6  --   --   < > = values in this interval not displayed.   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 Discontinue bagging with 10 of PEEP, this can be continued as needed. If the patient has a desat episode.  CARDIOVASCULAR A:  Septic shock, resolved  Hemorrhagic shock, resolved Chronic hypotension with history of adrenal insufficiency.  P:  MAP goal > 55 mmHg Cont hydrocortisone and midodrine per tube  RENAL A:  ESRD  P:  Monitor BMET intermittently Monitor I/Os Correct electrolytes as indicated Intermittent HD per Renal Service (MWF)  GASTROINTESTINAL A:  LGIB, resolved Hx of C. Diff Stool incontinence   P:  Cont PPI Resume TFs 10/30  HEMATOLOGIC A:  Acute on chronic anemia S/p Thrombocytopenia - HIT panel negative 9/25, resolved P: DVT px: SCDs + SQ heparin Monitor CBC intermittently Transfuse per usual ICU guidelines  INFECTIOUS A:  Recurrent Severe sepsis, due to sacral decub, currently stable.  Sacral decubitus ulcer with abscess  P:  Monitor temp, WBC count Micro and abx as above  ENDOCRINE A:  -DM 2, controlled. The patient developed some hypoglycemia when the tube feed was stopped and therefore she was started on D10 because she did not take in an adequate amount of calories to maintain her blood glucose levels in the normal range.  -Chronic steroid therapy, for a history of of adrenal insufficiency. P:  Monitor CBGs q 8 hrs - resume SSI if > 180 Cont hydrocortisone @ 10 mg BID Will resume tube feeds today, and discontinue her D10 drip. We'll need  to monitor blood glucose levels.  MSK A: Right hip fx - small intertrochanteric R fx - pain management. Ortho Evra evaluated and felt not a surgical candidate at this time.   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  I personally spoke with the husband today and updated him via telephone.  -Mr. Monroe asked specifically about whether his wife is not eating because she does not have the motivation and simply does not want to eat versus not liking the food. He noted in the past that she had not eaten when she was in the hospital and she was started on Megace and thiamine, and this increased her appetite significantly. He understands there is an increased risk of blood clots with Megace, but is willing to try it, therefore, I have ordered Megace and thiamine to be started today. -Mr. Munroe also asked about bagging the patient, I explained that this is a relatively aggressive maneuver which we usually try to use when the patient is decompensating. It carries risks of lung injury and lung collapse. Therefore, we would reserve this to when the patient is decompensating with low oxygen saturations. I explained that we are starting the patient on round-the-clock nebulizers and Mucomyst to be combined with suctioning. -Mr. Monroe asked about whether the patient would benefit from incentive spirometry. I explained that given the tracheostomy. This would be unlikely to benefit her because of the presence of the trach. He asked about whether the patient could be put back on speaking valve. I spent we would try this once the patient's respiratory status  improved, currently she has a cuffed trach and in case we had put her back on the ventilator. We would try to speaking valve. Once her status improves.   Wells Guileseep Courtney [name],  M.D.   Critical Care Attestation.  I have personally obtained a history, examined the patient, evaluated laboratory and imaging results, formulated the assessment and plan and placed orders. The Patient requires high complexity decision making for assessment and support, frequent evaluation and titration of therapies, application of advanced monitoring technologies and extensive interpretation of multiple databases. The patient has critical illness that could lead imminently to failure of 1 or more organ systems and requires the highest level of physician preparedness to intervene.  Critical Care Time devoted to patient care services described in this note is 35 minutes and is exclusive of time spent in procedures.

## 2015-09-22 NOTE — Progress Notes (Signed)
Subjective:    Tracheal aspirate= pseudomonas, VRE in blood. ID following Patient remains critically ill Did not wake up to interact today Constantly shaking her head "no" Desats noted last evening.     Objective:  Vital signs in last 24 hours:  Temp:  [97.8 F (36.6 C)-98.8 F (37.1 C)] 98.8 F (37.1 C) (11/03 0800) Pulse Rate:  [103-123] 111 (11/03 0800) Resp:  [0-32] 23 (11/03 0900) BP: (122-146)/(51-92) 122/53 mmHg (11/03 0900) SpO2:  [89 %-100 %] 99 % (11/03 0800) FiO2 (%):  [35 %-40 %] 35 % (11/03 0800) Weight:  [92 kg (202 lb 13.2 oz)] 92 kg (202 lb 13.2 oz) (11/03 0500)  Weight change: -2 kg (-4 lb 6.6 oz) Filed Weights   09/21/15 0500 09/21/15 0900 09/22/15 0500  Weight: 94 kg (207 lb 3.7 oz) 94 kg (207 lb 3.7 oz) 92 kg (202 lb 13.2 oz)    Intake/Output: I/O last 3 completed shifts: In: 1125 [I.V.:1125] Out: 1000 [Other:1000]   Intake/Output this shift:  Total I/O In: 150 [I.V.:120; NG/GT:30] Out: -   Physical Exam: General: No acute distress, critically ill  Head: NG in place  Eyes: anicteric  Neck: Trach in place  Lungs:  Scattered rhonchi, normal effort  Heart: S1S2 +tachycardia  Abdomen:  Soft, nontender, BS present  Extremities: + dependent edema  Neurologic: Eyes closed. Did not interact today  Access:  R IJ permcath.       Basic Metabolic Panel:  Recent Labs Lab 09/16/15 0424 09/16/15 0530 09/18/15 0554 09/20/15 0436 09/21/15 0459  NA 138 141 135 139 141  K 3.8 3.6 3.6 3.6 3.7  CL 101 102 98* 101 102  CO2 31 33* 31 32 32  GLUCOSE 146* 67 305* 164* 69  BUN 62* 44* 46* 37* 46*  CREATININE 1.25* 1.01* 1.30* 1.02* 1.26*  CALCIUM 8.0* 7.9* 7.6* 7.7* 8.1*    Liver Function Tests:  Recent Labs Lab 09/16/15 0424 09/16/15 0530 09/18/15 0554  AST 19 33 28  ALT 13* 20 20  ALKPHOS 1008* 1486* 1487*  BILITOT 0.5 0.8 0.6  PROT 4.4* 4.2* 4.1*  ALBUMIN 1.5* 1.8* 1.6*   No results for input(s): LIPASE, AMYLASE in the last 168  hours. No results for input(s): AMMONIA in the last 168 hours.  CBC:  Recent Labs Lab 09/16/15 0424 09/16/15 0530 09/18/15 0554 09/20/15 0436 09/21/15 0459  WBC 18.1* 11.9* 9.9 9.5 11.7*  NEUTROABS 15.8* 9.7*  --   --   --   HGB 8.2* 7.5* 7.2* 7.2* 7.6*  HCT 25.4* 24.0* 23.0* 23.4* 24.7*  MCV 101.5* 101.8* 102.6* 103.8* 104.1*  PLT 236 194 205 194 207    Cardiac Enzymes:  Recent Labs Lab 09/21/15 0459  CKTOTAL 10*    BNP: Invalid input(s): POCBNP  CBG:  Recent Labs Lab 09/21/15 0750 09/21/15 1350 09/21/15 1729 09/21/15 1829 09/22/15 0727  GLUCAP 63* 62* 64* 83 94    Microbiology: Results for orders placed or performed during the hospital encounter of 04-20-2015  Blood Culture (routine x 2)     Status: None   Collection Time: 04-20-2015  8:51 AM  Result Value Ref Range Status   Specimen Description BLOOD Dolores HooseUNKO  Final   Special Requests BOTTLES DRAWN AEROBIC AND ANAEROBIC  3CC  Final   Culture NO GROWTH 5 DAYS  Final   Report Status 08/17/2015 FINAL  Final  Blood Culture (routine x 2)     Status: None   Collection Time: 04-20-2015  9:20 AM  Result Value Ref Range Status   Specimen Description BLOOD LEFT ARM  Final   Special Requests BOTTLES DRAWN AEROBIC AND ANAEROBIC  1CC  Final   Culture NO GROWTH 5 DAYS  Final   Report Status 08/17/2015 FINAL  Final  Wound culture     Status: None   Collection Time: 08/04/2015  9:20 AM  Result Value Ref Range Status   Specimen Description DECUBITIS  Final   Special Requests Normal  Final   Gram Stain   Final    FEW WBC SEEN MANY GRAM NEGATIVE RODS RARE GRAM POSITIVE COCCI    Culture   Final    HEAVY GROWTH ESCHERICHIA COLI MODERATE GROWTH ENTEROBACTER AEROGENES PROTEUS MIRABILIS HEAVY GROWTH ENTEROCOCCUS SPECIES VRE HAVE INTRINSIC RESISTANCE TO MOST COMMONLY USED ANTIBIOTICS AND THE ABILITY TO ACQUIRE RESISTANCE TO MOST AVAILABLE ANTIBIOTICS.    Report Status 08/16/2015 FINAL  Final   Organism ID, Bacteria  ESCHERICHIA COLI  Final   Organism ID, Bacteria ENTEROBACTER AEROGENES  Final   Organism ID, Bacteria PROTEUS MIRABILIS  Final   Organism ID, Bacteria ENTEROCOCCUS SPECIES  Final      Susceptibility   Enterobacter aerogenes - MIC*    CEFTAZIDIME <=1 SENSITIVE Sensitive     CEFAZOLIN >=64 RESISTANT Resistant     CEFTRIAXONE <=1 SENSITIVE Sensitive     CIPROFLOXACIN <=0.25 SENSITIVE Sensitive     GENTAMICIN <=1 SENSITIVE Sensitive     IMIPENEM 1 SENSITIVE Sensitive     TRIMETH/SULFA <=20 SENSITIVE Sensitive     * MODERATE GROWTH ENTEROBACTER AEROGENES   Escherichia coli - MIC*    AMPICILLIN <=2 SENSITIVE Sensitive     CEFTAZIDIME <=1 SENSITIVE Sensitive     CEFAZOLIN <=4 SENSITIVE Sensitive     CEFTRIAXONE <=1 SENSITIVE Sensitive     CIPROFLOXACIN <=0.25 SENSITIVE Sensitive     GENTAMICIN <=1 SENSITIVE Sensitive     IMIPENEM <=0.25 SENSITIVE Sensitive     TRIMETH/SULFA <=20 SENSITIVE Sensitive     * HEAVY GROWTH ESCHERICHIA COLI   Proteus mirabilis - MIC*    AMPICILLIN >=32 RESISTANT Resistant     CEFTAZIDIME <=1 SENSITIVE Sensitive     CEFAZOLIN 8 SENSITIVE Sensitive     CEFTRIAXONE <=1 SENSITIVE Sensitive     CIPROFLOXACIN <=0.25 SENSITIVE Sensitive     GENTAMICIN <=1 SENSITIVE Sensitive     IMIPENEM 1 SENSITIVE Sensitive     TRIMETH/SULFA <=20 SENSITIVE Sensitive     * PROTEUS MIRABILIS   Enterococcus species - MIC*    AMPICILLIN >=32 RESISTANT Resistant     VANCOMYCIN >=32 RESISTANT Resistant     GENTAMICIN SYNERGY SENSITIVE Sensitive     TETRACYCLINE Value in next row Resistant      RESISTANT>=16    * HEAVY GROWTH ENTEROCOCCUS SPECIES  MRSA PCR Screening     Status: None   Collection Time: 07/28/2015  2:38 PM  Result Value Ref Range Status   MRSA by PCR NEGATIVE NEGATIVE Final    Comment:        The GeneXpert MRSA Assay (FDA approved for NASAL specimens only), is one component of a comprehensive MRSA colonization surveillance program. It is not intended to diagnose  MRSA infection nor to guide or monitor treatment for MRSA infections.   Blood culture (single)     Status: None   Collection Time: 08/10/2015  3:36 PM  Result Value Ref Range Status   Specimen Description BLOOD RIGHT ASSIST CONTROL  Final   Special Requests BOTTLES DRAWN AEROBIC AND ANAEROBIC  Final   Culture NO GROWTH 5 DAYS  Final   Report Status 08/17/2015 FINAL  Final  Wound culture     Status: None   Collection Time: 08/13/15 12:37 PM  Result Value Ref Range Status   Specimen Description WOUND  Final   Special Requests Normal  Final   Gram Stain   Final    FEW WBC SEEN TOO NUMEROUS TO COUNT GRAM NEGATIVE RODS FEW GRAM POSITIVE COCCI    Culture   Final    HEAVY GROWTH ESCHERICHIA COLI MODERATE GROWTH PROTEUS MIRABILIS LIGHT GROWTH KLEBSIELLA PNEUMONIAE MODERATE GROWTH ENTEROCOCCUS GALLINARUM CRITICAL RESULT CALLED TO, READ BACK BY AND VERIFIED WITH: [facility] BORBA AT 1042 08/16/15 DV    Report Status 08/17/2015 FINAL  Final   Organism ID, Bacteria ESCHERICHIA COLI  Final   Organism ID, Bacteria PROTEUS MIRABILIS  Final   Organism ID, Bacteria KLEBSIELLA PNEUMONIAE  Final   Organism ID, Bacteria ENTEROCOCCUS GALLINARUM  Final      Susceptibility   Escherichia coli - MIC*    AMPICILLIN >=32 RESISTANT Resistant     CEFTAZIDIME 4 RESISTANT Resistant     CEFAZOLIN >=64 RESISTANT Resistant     CEFTRIAXONE 16 RESISTANT Resistant     GENTAMICIN 2 SENSITIVE Sensitive     IMIPENEM >=16 RESISTANT Resistant     TRIMETH/SULFA <=20 SENSITIVE Sensitive     Extended ESBL POSITIVE Resistant     PIP/TAZO Value in next row Resistant      RESISTANT>=128    CIPROFLOXACIN Value in next row Sensitive      SENSITIVE<=0.25    * HEAVY GROWTH ESCHERICHIA COLI   Klebsiella pneumoniae - MIC*    AMPICILLIN Value in next row Resistant      SENSITIVE<=0.25    CEFTAZIDIME Value in next row Resistant      SENSITIVE<=0.25    CEFAZOLIN Value in next row Resistant      SENSITIVE<=0.25     CEFTRIAXONE Value in next row Resistant      SENSITIVE<=0.25    CIPROFLOXACIN Value in next row Resistant      SENSITIVE<=0.25    GENTAMICIN Value in next row Sensitive      SENSITIVE<=0.25    IMIPENEM Value in next row Resistant      SENSITIVE<=0.25    TRIMETH/SULFA Value in next row Resistant      SENSITIVE<=0.25    PIP/TAZO Value in next row Resistant      RESISTANT>=128    * LIGHT GROWTH KLEBSIELLA PNEUMONIAE   Proteus mirabilis - MIC*    AMPICILLIN Value in next row Resistant      RESISTANT>=128    CEFTAZIDIME Value in next row Sensitive      RESISTANT>=128    CEFAZOLIN Value in next row Sensitive      RESISTANT>=128    CEFTRIAXONE Value in next row Sensitive      RESISTANT>=128    CIPROFLOXACIN Value in next row Sensitive      RESISTANT>=128    GENTAMICIN Value in next row Sensitive      RESISTANT>=128    IMIPENEM Value in next row Sensitive      RESISTANT>=128    TRIMETH/SULFA Value in next row Sensitive      RESISTANT>=128    PIP/TAZO Value in next row Sensitive      SENSITIVE<=4    * MODERATE GROWTH PROTEUS MIRABILIS   Enterococcus gallinarum - MIC*    AMPICILLIN Value in next row Resistant      SENSITIVE<=4  GENTAMICIN SYNERGY Value in next row Sensitive      SENSITIVE<=4    CIPROFLOXACIN Value in next row Resistant      RESISTANT>=8    TETRACYCLINE Value in next row Resistant      RESISTANT>=16    * MODERATE GROWTH ENTEROCOCCUS GALLINARUM  Wound culture     Status: None   Collection Time: 08/15/15  2:53 PM  Result Value Ref Range Status   Specimen Description WOUND  Final   Special Requests NONE  Final   Gram Stain FEW WBC SEEN NO ORGANISMS SEEN   Final   Culture NO GROWTH 3 DAYS  Final   Report Status 08/18/2015 FINAL  Final  C difficile quick scan w PCR reflex     Status: None   Collection Time: 08/17/15 11:34 AM  Result Value Ref Range Status   C Diff antigen NEGATIVE NEGATIVE Final   C Diff toxin NEGATIVE NEGATIVE Final   C Diff  interpretation Negative for C. difficile  Final  Stool culture     Status: None   Collection Time: 09/03/15  3:51 PM  Result Value Ref Range Status   Specimen Description STOOL  Final   Special Requests Immunocompromised  Final   Culture   Final    NO SALMONELLA OR SHIGELLA ISOLATED No Pathogenic E. coli detected NO CAMPYLOBACTER DETECTED    Report Status 09/07/2015 FINAL  Final  C difficile quick scan w PCR reflex     Status: None   Collection Time: 09/13/15 12:51 AM  Result Value Ref Range Status   C Diff antigen NEGATIVE NEGATIVE Final   C Diff toxin NEGATIVE NEGATIVE Final   C Diff interpretation Negative for C. difficile  Final  Culture, blood (routine x 2)     Status: None   Collection Time: 09/16/15 11:24 AM  Result Value Ref Range Status   Specimen Description BLOOD LEFT HAND  Final   Special Requests BOTTLES DRAWN AEROBIC AND ANAEROBIC  1CC  Final   Culture NO GROWTH 5 DAYS  Final   Report Status 09/21/2015 FINAL  Final  Culture, blood (routine x 2)     Status: None   Collection Time: 09/16/15 12:23 PM  Result Value Ref Range Status   Specimen Description BLOOD RIGHT HAND  Final   Special Requests BOTTLES DRAWN AEROBIC AND ANAEROBIC  1CC  Final   Culture  Setup Time   Final    GRAM POSITIVE COCCI AEROBIC BOTTLE ONLY CRITICAL RESULT CALLED TO, READ BACK BY AND VERIFIED WITH: TESS THOMAS,RN 09/17/2015 0631 BY JRS.    Culture   Final    ENTEROCOCCUS FAECALIS AEROBIC BOTTLE ONLY VRE HAVE INTRINSIC RESISTANCE TO MOST COMMONLY USED ANTIBIOTICS AND THE ABILITY TO ACQUIRE RESISTANCE TO MOST AVAILABLE ANTIBIOTICS. CRITICAL RESULT CALLED TO, READ BACK BY AND VERIFIED WITH: CHERYL SMITH AT 1610 09/19/15 DV    Report Status 09/21/2015 FINAL  Final   Organism ID, Bacteria ENTEROCOCCUS FAECALIS  Final      Susceptibility   Enterococcus faecalis - MIC*    AMPICILLIN <=2 SENSITIVE Sensitive     LINEZOLID 2 SENSITIVE Sensitive     CIPROFLOXACIN Value in next row Resistant       RESISTANT>=8    TETRACYCLINE Value in next row Resistant      RESISTANT>=16    VANCOMYCIN Value in next row Resistant      RESISTANT>=32    GENTAMICIN SYNERGY Value in next row Resistant      RESISTANT>=32    *  ENTEROCOCCUS FAECALIS  Culture, respiratory (NON-Expectorated)     Status: None   Collection Time: 09/16/15  3:50 PM  Result Value Ref Range Status   Specimen Description TRACHEAL ASPIRATE  Final   Special Requests Immunocompromised  Final   Gram Stain   Final    FEW WBC SEEN GOOD SPECIMEN - 80-90% WBCS RARE GRAM NEGATIVE RODS    Culture LIGHT GROWTH PSEUDOMONAS AERUGINOSA  Final   Report Status 09/19/2015 FINAL  Final   Organism ID, Bacteria PSEUDOMONAS AERUGINOSA  Final      Susceptibility   Pseudomonas aeruginosa - MIC*    CEFTAZIDIME 8 SENSITIVE Sensitive     CIPROFLOXACIN 2 INTERMEDIATE Intermediate     GENTAMICIN >=16 RESISTANT Resistant     IMIPENEM >=16 RESISTANT Resistant     PIP/TAZO Value in next row Sensitive      SENSITIVE32    CEFEPIME Value in next row Sensitive      SENSITIVE8    LEVOFLOXACIN Value in next row Resistant      RESISTANT>=8    * LIGHT GROWTH PSEUDOMONAS AERUGINOSA    Coagulation Studies: No results for input(s): LABPROT, INR in the last 72 hours.  Urinalysis: No results for input(s): COLORURINE, LABSPEC, PHURINE, GLUCOSEU, HGBUR, BILIRUBINUR, KETONESUR, PROTEINUR, UROBILINOGEN, NITRITE, LEUKOCYTESUR in the last 72 hours.  Invalid input(s): APPERANCEUR    Imaging: Dg Chest 1 View  09/21/2015  CLINICAL DATA:  Shortness of breath. EXAM: CHEST 1 VIEW COMPARISON:  Chest x-ray dated 09/18/2015 FINDINGS: Tracheostomy tube, feeding tube and 2 central catheters remain in place, unchanged. Persistent cardiomegaly. Pulmonary vascularity is normal. New atelectasis and consolidation at the left lung base medially with air bronchograms. IMPRESSION: New infiltrate and atelectasis at the left lung base. Electronically Signed   By: Francene Boyers  M.D.   On: 09/21/2015 15:17     Medications:   . dextrose 60 mL/hr at 09/22/15 0900   . antiseptic oral rinse  7 mL Mouth Rinse q12n4p  . chlorhexidine  15 mL Mouth Rinse BID  . DAPTOmycin (CUBICIN)  IV  500 mg Intravenous Q48H  . epoetin (EPOGEN/PROCRIT) injection  10,000 Units Intravenous Q M,W,F-HD  . folic acid  1 mg Oral Daily  . heparin subcutaneous  5,000 Units Subcutaneous Q12H  . hydrocortisone  10 mg Per Tube BID  . lidocaine  1 patch Transdermal Q24H  . midodrine  5 mg Per Tube TID WC  . multivitamin  5 mL Oral Daily  . nystatin   Topical TID  . sodium chloride  10-40 mL Intracatheter Q12H  . sodium hypochlorite   Irrigation BID   sodium chloride, sodium chloride, acetaminophen (TYLENOL) oral liquid 160 mg/5 mL, HYDROmorphone (DILAUDID) injection, ipratropium-albuterol, morphine injection, oxyCODONE, sodium chloride  Assessment/ Plan:  49 y.o. 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 Admitted on Aug 30, 2015  1. End-stage renal disease on hemodialysis on HD MWF. The patient has been on dialysis since October of 2014. R IJ permcath.   - Plan on continuing hemodialysis on MWF schedule.  - midodine and albumin  with treatment.   -  UF 1000 cc achieved with HD on Wednesday - Next HD Friday  2. Anemia of CKD: pt continues to require periodic blood transfusions, hemoglobin of 7.2 yesterday.  - epo has not  been as effective as hoped secondary due to chronic inflammation from decubitus ulcer/infection   continue epogen with HD. This has been ordered as a standing order.   3. Secondary hyperparathyroidism: PTH low at 55 - phos was acceptable at 3.1 - no binders  4. ID: VRE in blood Pseudomonas in tracheal aspirate Treatment as per ID and ICU team  5. Generalized Dependent edema due to 3rd spacing - UF with dialysis , as tolerated   LOS: 41 Craigory Toste 11/3/20169:27 AM

## 2015-09-22 NOTE — Consult Note (Signed)
Asked by infectious disease consultant to scrape bone for culture of sacrum. Family not present at this time therefore I will discuss this with them tomorrow and obtain consent at that time.

## 2015-09-22 NOTE — Progress Notes (Signed)
Lung Recruitment done with manuel ventilation bag times 15 breaths with peep valve set at 10cm, pt tolerated well

## 2015-09-23 LAB — BASIC METABOLIC PANEL
Anion gap: 4 — ABNORMAL LOW (ref 5–15)
BUN: 35 mg/dL — ABNORMAL HIGH (ref 6–20)
CHLORIDE: 102 mmol/L (ref 101–111)
CO2: 33 mmol/L — AB (ref 22–32)
CREATININE: 1.19 mg/dL — AB (ref 0.44–1.00)
Calcium: 7.8 mg/dL — ABNORMAL LOW (ref 8.9–10.3)
GFR calc non Af Amer: 52 mL/min — ABNORMAL LOW (ref >60)
GLUCOSE: 222 mg/dL — AB (ref 65–99)
Potassium: 3.7 mmol/L (ref 3.5–5.1)
Sodium: 139 mmol/L (ref 135–145)

## 2015-09-23 LAB — GLUCOSE, CAPILLARY
GLUCOSE-CAPILLARY: 219 mg/dL — AB (ref 65–99)
GLUCOSE-CAPILLARY: 172 mg/dL — AB (ref 65–99)
Glucose-Capillary: 151 mg/dL — ABNORMAL HIGH (ref 65–99)
Glucose-Capillary: 163 mg/dL — ABNORMAL HIGH (ref 65–99)
Glucose-Capillary: 183 mg/dL — ABNORMAL HIGH (ref 65–99)

## 2015-09-23 LAB — CBC
HEMATOCRIT: 23.6 % — AB (ref 35.0–47.0)
HEMOGLOBIN: 7.3 g/dL — AB (ref 12.0–16.0)
MCH: 32.5 pg (ref 26.0–34.0)
MCHC: 30.9 g/dL — AB (ref 32.0–36.0)
MCV: 105.1 fL — AB (ref 80.0–100.0)
Platelets: 199 10*3/uL (ref 150–440)
RBC: 2.24 MIL/uL — ABNORMAL LOW (ref 3.80–5.20)
RDW: 21.3 % — ABNORMAL HIGH (ref 11.5–14.5)
WBC: 13.6 10*3/uL — ABNORMAL HIGH (ref 3.6–11.0)

## 2015-09-23 MED ORDER — INSULIN ASPART 100 UNIT/ML ~~LOC~~ SOLN
0.0000 [IU] | SUBCUTANEOUS | Status: DC
  Administered 2015-09-23 (×3): 2 [IU] via SUBCUTANEOUS
  Administered 2015-09-24: 1 [IU] via SUBCUTANEOUS
  Administered 2015-09-24: 2 [IU] via SUBCUTANEOUS
  Administered 2015-09-25: 1 [IU] via SUBCUTANEOUS
  Administered 2015-09-25: 2 [IU] via SUBCUTANEOUS
  Administered 2015-09-26 – 2015-09-27 (×5): 1 [IU] via SUBCUTANEOUS
  Administered 2015-09-27: 05:00:00 via SUBCUTANEOUS
  Administered 2015-09-27 – 2015-10-06 (×28): 1 [IU] via SUBCUTANEOUS
  Administered 2015-10-06: 2 [IU] via SUBCUTANEOUS
  Administered 2015-10-06: 3 [IU] via SUBCUTANEOUS
  Administered 2015-10-07: 2 [IU] via SUBCUTANEOUS
  Administered 2015-10-07 – 2015-10-08 (×4): 1 [IU] via SUBCUTANEOUS
  Administered 2015-10-08: 2 [IU] via SUBCUTANEOUS
  Administered 2015-10-08: 1 [IU] via SUBCUTANEOUS
  Administered 2015-10-08: 3 [IU] via SUBCUTANEOUS
  Administered 2015-10-08: 1 [IU] via SUBCUTANEOUS
  Administered 2015-10-09: 2 [IU] via SUBCUTANEOUS
  Administered 2015-10-09 – 2015-10-13 (×10): 1 [IU] via SUBCUTANEOUS
  Administered 2015-10-13: 2 [IU] via SUBCUTANEOUS
  Administered 2015-10-13: 1 [IU] via SUBCUTANEOUS
  Administered 2015-10-14: 2 [IU] via SUBCUTANEOUS
  Administered 2015-10-14 (×2): 1 [IU] via SUBCUTANEOUS
  Administered 2015-10-14: 2 [IU] via SUBCUTANEOUS
  Administered 2015-10-15 (×4): 1 [IU] via SUBCUTANEOUS
  Administered 2015-10-15 – 2015-10-16 (×2): 2 [IU] via SUBCUTANEOUS
  Administered 2015-10-16 (×2): 1 [IU] via SUBCUTANEOUS
  Administered 2015-10-17: 2 [IU] via SUBCUTANEOUS
  Administered 2015-10-17: 1 [IU] via SUBCUTANEOUS
  Administered 2015-10-17: 2 [IU] via SUBCUTANEOUS
  Administered 2015-10-17 – 2015-10-18 (×3): 1 [IU] via SUBCUTANEOUS
  Administered 2015-10-18 (×2): 2 [IU] via SUBCUTANEOUS
  Administered 2015-10-18: 1 [IU] via SUBCUTANEOUS
  Administered 2015-10-19 (×2): 2 [IU] via SUBCUTANEOUS
  Administered 2015-10-19: 1 [IU] via SUBCUTANEOUS
  Administered 2015-10-19: 2 [IU] via SUBCUTANEOUS
  Administered 2015-10-19: 1 [IU] via SUBCUTANEOUS
  Administered 2015-10-20: 2 [IU] via SUBCUTANEOUS
  Administered 2015-10-20 (×2): 1 [IU] via SUBCUTANEOUS
  Administered 2015-10-21: 2 [IU] via SUBCUTANEOUS
  Administered 2015-10-21 – 2015-10-22 (×7): 1 [IU] via SUBCUTANEOUS
  Administered 2015-10-23: 2 [IU] via SUBCUTANEOUS
  Administered 2015-10-23 – 2015-10-24 (×4): 1 [IU] via SUBCUTANEOUS
  Administered 2015-10-24: 2 [IU] via SUBCUTANEOUS
  Administered 2015-10-24 – 2015-10-26 (×7): 1 [IU] via SUBCUTANEOUS
  Administered 2015-10-26: 2 [IU] via SUBCUTANEOUS
  Administered 2015-10-26 – 2015-11-01 (×12): 1 [IU] via SUBCUTANEOUS
  Administered 2015-11-02: 2 [IU] via SUBCUTANEOUS
  Administered 2015-11-02 (×2): 1 [IU] via SUBCUTANEOUS
  Administered 2015-11-03: 2 [IU] via SUBCUTANEOUS
  Administered 2015-11-03 – 2015-11-11 (×8): 1 [IU] via SUBCUTANEOUS
  Administered 2015-11-11: 2 [IU] via SUBCUTANEOUS
  Filled 2015-09-23 (×3): qty 1
  Filled 2015-09-23: qty 2
  Filled 2015-09-23: qty 3
  Filled 2015-09-23: qty 1
  Filled 2015-09-23: qty 2
  Filled 2015-09-23 (×6): qty 1
  Filled 2015-09-23: qty 2
  Filled 2015-09-23 (×4): qty 1
  Filled 2015-09-23: qty 2
  Filled 2015-09-23 (×2): qty 1
  Filled 2015-09-23: qty 2
  Filled 2015-09-23: qty 1
  Filled 2015-09-23: qty 2
  Filled 2015-09-23: qty 1
  Filled 2015-09-23: qty 2
  Filled 2015-09-23 (×3): qty 1
  Filled 2015-09-23 (×2): qty 2
  Filled 2015-09-23 (×8): qty 1
  Filled 2015-09-23: qty 2
  Filled 2015-09-23 (×2): qty 1
  Filled 2015-09-23: qty 3
  Filled 2015-09-23 (×3): qty 1
  Filled 2015-09-23: qty 5
  Filled 2015-09-23 (×3): qty 1
  Filled 2015-09-23: qty 2
  Filled 2015-09-23 (×5): qty 1
  Filled 2015-09-23 (×2): qty 2
  Filled 2015-09-23 (×9): qty 1
  Filled 2015-09-23: qty 3
  Filled 2015-09-23 (×8): qty 1
  Filled 2015-09-23 (×2): qty 2
  Filled 2015-09-23: qty 1
  Filled 2015-09-23: qty 3
  Filled 2015-09-23 (×5): qty 1
  Filled 2015-09-23: qty 2
  Filled 2015-09-23 (×3): qty 1
  Filled 2015-09-23: qty 2
  Filled 2015-09-23: qty 1
  Filled 2015-09-23 (×3): qty 2
  Filled 2015-09-23 (×3): qty 1
  Filled 2015-09-23: qty 2
  Filled 2015-09-23 (×2): qty 1
  Filled 2015-09-23: qty 3
  Filled 2015-09-23: qty 2
  Filled 2015-09-23 (×2): qty 1
  Filled 2015-09-23: qty 2
  Filled 2015-09-23 (×3): qty 1
  Filled 2015-09-23 (×2): qty 2
  Filled 2015-09-23 (×4): qty 1
  Filled 2015-09-23: qty 2
  Filled 2015-09-23: qty 1
  Filled 2015-09-23: qty 2
  Filled 2015-09-23 (×3): qty 1
  Filled 2015-09-23: qty 2
  Filled 2015-09-23 (×5): qty 1
  Filled 2015-09-23: qty 2
  Filled 2015-09-23 (×2): qty 1
  Filled 2015-09-23: qty 2
  Filled 2015-09-23 (×6): qty 1
  Filled 2015-09-23: qty 2
  Filled 2015-09-23 (×2): qty 1
  Filled 2015-09-23: qty 2
  Filled 2015-09-23: qty 1

## 2015-09-23 MED ORDER — ACETYLCYSTEINE 20 % IN SOLN
3.0000 mL | Freq: Four times a day (QID) | RESPIRATORY_TRACT | Status: DC
  Administered 2015-09-23 – 2015-09-25 (×6): 3 mL via RESPIRATORY_TRACT
  Administered 2015-09-25 (×2): via RESPIRATORY_TRACT
  Administered 2015-09-25 – 2015-09-26 (×2): 4 mL via RESPIRATORY_TRACT
  Administered 2015-09-26: 3 mL via RESPIRATORY_TRACT
  Filled 2015-09-23 (×10): qty 4

## 2015-09-23 MED ORDER — IPRATROPIUM-ALBUTEROL 0.5-2.5 (3) MG/3ML IN SOLN
3.0000 mL | Freq: Four times a day (QID) | RESPIRATORY_TRACT | Status: DC
  Administered 2015-09-23 – 2015-10-27 (×136): 3 mL via RESPIRATORY_TRACT
  Filled 2015-09-23 (×133): qty 3

## 2015-09-23 MED ORDER — ALBUMIN HUMAN 25 % IV SOLN
12.5000 g | Freq: Once | INTRAVENOUS | Status: AC
  Administered 2015-09-23: 12.5 g via INTRAVENOUS
  Filled 2015-09-23: qty 50

## 2015-09-23 NOTE — Progress Notes (Signed)
Pt set up to eat breakfast, pt nod in yes motion when asked if liked yogurt. This RN attempted to place yogurt at mouth of pt and pt began shaking head no motion, this RN then attempted to offers all foods on tray and pt cont to shake head no and would not open mouth

## 2015-09-23 NOTE — Progress Notes (Signed)
Nutrition Follow-up  DOCUMENTATION CODES:   Severe malnutrition in context of acute illness/injury  INTERVENTION:   EN: continue current TF regimen as tolerated; goal rate of 65 ml/hr Medical Food Supplement Therapy: continue supplements as ordered Meals and Snacks: Continue snacks between meals, cater to pt preferences as able Feeding Assistance: continue to assist and encourage pt at meal times and intake between meals as well  NUTRITION DIAGNOSIS:   Inadequate oral intake related to wound healing, acute illness, chronic illness as evidenced by NPO status, estimated needs.  GOAL:   Patient will meet greater than or equal to 90% of their needs  MONITOR:    (Energy Intake, Anthropometrics, Digestive System, Electrolyte/Renal Profile, Glucose Profile)  REASON FOR ASSESSMENT:   Consult Enteral/tube feeding initiation and management  ASSESSMENT:     Diet Order:  DIET - DYS 1 Room service appropriate?: Yes; Fluid consistency:: Nectar Thick   Energy Intake: pt continues to eat 0% at meal periods, no supplements, no snacks  EN: tolerating Vital 1.2 AF at rate of 50 ml/hr with Prostat  Skin:   (stage IV sacrum, stage III hip, unstageable foot; rectal ulcer from fleixseal, ulcer in nose from dobhoff)   Digestive System: no signs of TF intolerance, loose stool continue  Electrolyte and Renal Profile:  Recent Labs Lab 09/20/15 0436 09/21/15 0459 09/23/15 0503  BUN 37* 46* 35*  CREATININE 1.02* 1.26* 1.19*  NA 139 141 139  K 3.6 3.7 3.7   Glucose Profile:  Recent Labs  09/22/15 1616 09/23/15 0003 09/23/15 0722  GLUCAP 143* 183* 219*   Meds: noted megace restarted, ss novolog restarted at well, D10 order discontinued this AM, thiamine  Height:   Ht Readings from Last 1 Encounters:  November 01, 2015 5\' 9"  (1.753 m)    Weight:   Wt Readings from Last 1 Encounters:  09/23/15 209 lb 7 oz (95 kg)    Filed Weights   09/22/15 0500 09/23/15 0500 09/23/15 0930   Weight: 202 lb 13.2 oz (92 kg) 209 lb 7 oz (95 kg) 209 lb 7 oz (95 kg)     BMI:  Body mass index is 30.91 kg/(m^2).  Estimated Nutritional Needs:   Kcal:  2175-2535 kcals (BEE 1509, 1.2 AF, 1.2-1.4 IF) or 2490-2905 kcals (30-35 kcals/kg)   Protein:  125-166 g (1.5-2.0 g/kg) but likely closer to 166-208 g (2.0-2.5 g/kg)   Fluid:  1000 mL plus UOP  EDUCATION NEEDS:   No education needs identified at this time  HIGH Care Level  Romelle Starcherate Amiri Riechers MS, RD, LDN [phone] Pager

## 2015-09-23 NOTE — Progress Notes (Signed)
Post hd tx 

## 2015-09-23 NOTE — Progress Notes (Signed)
[facility] [place] Critical Care Medicine Progess Note  Name: Jadda Hunsucker MRN: [id number] DOB: [birth year]    ADMISSION DATE:  08/10/2015     INITIAL PRESENTATION:  55 F who has been in medical facilities (hosp, LTAC, rehab) for 2 yrs following gastric bypass surgery with multiple complications. Now with chronic trach, ESRD, profound debilitation, severe sacral pressure ulcer. Was seemingly making progress and transferred to rehab facility approx one week prior to this admission. She was sent to [facility] ED with AMS and hypotension. Working dx of severe sepsis/septic shock due to infected sacral pressure ulcer. Since admission. Her course is been very complicated with numerous complications including septic shock and GI bleeding. Currently, she has an NG tube in place, she takes in minimal by mouth nutrition, though she appears to be able to do this, however, she is refusing to eat.  INTERVAL HISTORY: The patient completed her oral feeding trial with calorie count. She was subsequently placed back on tube feeds, however, the case was discussed with the critical care RN and she continues to routinely refuses oral food intake. The patient is otherwise awake and alert, she appears in no acute distress. She is nonverbal at this time, but she has no particular complaints and nods her head "no" when asked if she is having any pain or other significant issues. We have continued nebulizer treatments with Mucomyst and DuoNeb nebs every 4 with frequent suctioning, per respiratory therapist and critical care RN. The patient continues to have some thick secretions that appeared to be normal in color.  The patient was started on Megace and thiamine, on 09/22/2015 per the husband's request.  MAJOR EVENTS/TEST RESULTS: Admission 02/07/14-05/07/14 Admission 07/21/14-09/06/14 Discharged to [facility]. Pt had palliative consult at that time, were asked to sign off by husband.  09/23 CT head: NAD 09/23 EEG: no  epileptiform activity 09/23 PRBCs for Hgb 6.4 09/24 bedside debridement of sacral wound. Abscess drained 09/25 Off vasopressors. More alert. No distress. Worsening thrombocytopenia. Vanc DC'd 09/29  Dr. Sampson Goon (I.D) excused from the case by patient's husband. 10/02 MRI -multiple infarcts 10/03 tracheal bleeding- transfused platelets 10/03 hospitalist service excused from the case by patient's husband 10/03 Echocardiogram ejection fraction was 55-60%, pulmonary systolic pressure was 39 mmHg 74/25 restart TF's at lower rate, attempt reg HD  10/12 Transferred to med-surg floor. Remains on PCCM service 10/14 SLP eval: pt unable to tolerate PMV adequately 10/17-will re-attempt PM valve-discussed with Speech therapist 10/18 passed swallow eval-start pureed thick foods no thin liquids-continue NG feeds 10/19 transferred to step down for sepsis/aspiration pneumonia 10/19 cxr shows RLL opacity 10/21 sacral decub debride at bedside by surgery 10/22 started back on vasopressors while on HD 10/27 CT with osteo, R hip fx, unable to identify tip of dubhoff tube - sent for fluoro study 10/28 Ortho consultation: I do not feel that she is a surgical candidate. Therefore, I feel that it would best to manage this fracture nonsurgically and allow it to heal by itself over time, which it should.  10/28 Gen Surg consultation: Due to the lack of free air or free flow of contrast and the peritoneum there is no indication for any surgical intervention on this. Would recommend pulling back the feeding tube 1-2 cm. Would recheck an abdominal film to confirm no pneumoperitoneum in the morning. Absence of any changes okay to continue using Dobbhoff for feeding and medications. 10/28 gastrograffin study: The study confirms that the feeding tube pes perforated through the duodenum and the tip is within  a cavity that fills with injected contrast. 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 Pneumoniae, P. Mirabilis, VRE Wound 9/26 >> No growth Blood 9/23 >> NEG CDiff 9/27>>neg Stool Cx 10/15>> negative Cdiff 10/25>>neg Trach Aspirate 10/28>> light growth pseudomonas Blood 10/28 >> 1/2 GPC >>  Sputum cultures obtained 09/22/15 due to mucus plugging, results are pending.   ANTIMICROBIALS:  Aztreonam 9/23 >> 9/24 Vanc 9/23 >> 9/25 Vanc 9/26>>9/27 Daptomycin 9/27>> 10/12, 10/28>>  Meropenem 9/23 >> 10/14, 10/28 >> 10/31   VITAL SIGNS: Temp:  [98.3 F (36.8 C)-98.8 F (37.1 C)] 98.3 F (36.8 C) (11/04 0930) Pulse Rate:  [111-126] 117 (11/04 1045) Resp:  [0-31] 12 (11/04 1045) BP: (85-139)/(31-97) 107/69 mmHg (11/04 1045) SpO2:  [92 %-100 %] 100 % (11/04 1045) FiO2 (%):  [30 %-35 %] 30 % (11/04 0750) Weight:  [95 kg (209 lb 7 oz)] 95 kg (209 lb 7 oz) (11/04 0930) HEMODYNAMICS:   VENTILATOR SETTINGS: Vent Mode:  [-]  FiO2 (%):  [30 %-35 %] 30 % INTAKE / OUTPUT:  Intake/Output Summary (Last 24 hours) at 09/23/15 1103 Last data filed at 09/23/15 0900  Gross per 24 hour  Intake   1510 ml  Output      0 ml  Net   1510 ml    PHYSICAL EXAMINATION: Physical Examination:   VS: BP 107/69 mmHg  Pulse 117  Temp(Src) 98.3 F (36.8 C) (Axillary)  Resp 12  Ht  (1.753 m)  Wt 95 kg (209 lb 7 oz)  BMI 30.91 kg/m2  SpO2 100%  LMP  (LMP Unknown)  General Appearance: No resp distress, coarse upper airway sounds  Neuro: profoundly diffusely weak HEENT: cushingoid facies, PERRLA, EOM intact Neck: trach site clean Pulmonary: clear anteriorly Cardiovascular: reg, + syst murmur Abdomen: soft, NT,  +BS Extremities: warm, R>L UE edema Examination of the sacral decubitus ulcer today showed a very deep ulcer approximately 7-8 cm across. The granulation tissue and otherwise good signs of healing. It appeared to be a stage IV ulcer.   LABORATORY PANEL:   CBC  Recent Labs Lab 09/23/15 0503  WBC 13.6*  HGB 7.3*  HCT 23.6*  PLT 199    Chemistries   Recent Labs Lab 09/18/15 0554  09/23/15 0503  NA 135  < > 139  K 3.6  < > 3.7  CL 98*  < > 102  CO2 31  < > 33*  GLUCOSE 305*  < > 222*  BUN 46*  < > 35*  CREATININE 1.30*  < > 1.19*  CALCIUM 7.6*  < > 7.8*  AST 28  --   --   ALT 20  --   --   ALKPHOS 1487*  --   --   BILITOT 0.6  --   --   < > = values in this interval not displayed.   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 BMET intermittently Monitor I/Os Correct electrolytes as indicated Intermittent HD per Renal Service (MWF)  GASTROINTESTINAL A:  LGIB, resolved Hx of C. Diff Stool incontinence   P:  Cont PPI Resume TFs 10/30  HEMATOLOGIC A:  Acute on chronic anemia S/p Thrombocytopenia - HIT panel negative 9/25, resolved P: DVT px: SCDs + SQ heparin Monitor CBC intermittently Transfuse per usual ICU guidelines  INFECTIOUS A:  Recurrent Severe sepsis, due to sacral decub, currently stable.  Sacral decubitus ulcer with abscess  P:  Monitor temp, WBC count Micro and abx as above  ENDOCRINE A:  -DM 2, controlled. The patient developed some hypoglycemia when the tube feed was stopped and therefore she was started on D10 because she did not take in an adequate amount of calories to maintain her blood glucose levels in the normal range.  -Chronic steroid  therapy, for a history of of adrenal insufficiency. P:  Monitor CBGs q 8 hrs - resume SSI if > 180 Cont hydrocortisone @ 10 mg BID Continue  tube feeds. We'll need to monitor blood glucose levels.  MSK A: Right hip fx - small intertrochanteric R fx - pain management. Ortho evaluated and felt not a surgical candidate at this time.   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  I personally spoke with the husband today and updated him via telephone.  -Mr. Monroe was updated on his wife's condition today, in particular her continued refusal of food. A sputum culture that was obtained yesterday is still pending, I let him know that the Megace and thiamine were started yesterday as we discussed. Otherwise, her medical condition is stable, and minimally changed from previous days. Her respiratory status is improved from where it was 2 days ago, and is now stable. He is hopeful that the addition of the Megace and thiamine will stimulate his wife's appetite and that hopefully she will start eating over the next couple of days.   Wells Guileseep Bernetta Sutley, M.D.   Critical Care Attestation.  Case was discussed with critical care RN, nurse manager, social worker, dietary, critical care pharmacist.  I have personally obtained a history, examined the patient, evaluated laboratory and imaging results, formulated the assessment and plan and placed orders. The Patient requires high complexity decision making for assessment and support, frequent evaluation and titration of therapies, application of advanced monitoring technologies and extensive interpretation of multiple databases. The patient has critical illness that could lead imminently to failure of 1 or more organ systems and requires the highest level of physician  preparedness to intervene.  Critical Care Time devoted to patient care services described in this note is 35 minutes and is exclusive of time spent in procedures.

## 2015-09-23 NOTE — Progress Notes (Signed)
Occupational Therapy Treatment Patient Details Name: Starlina Lapre MRN: [id number] DOB: 1966-06-28 Today's Date: 09/23/2015    History of present illness Pt is a 57 F who has been in medical facilities (hosp, LTAC, rehab) for 2 yrs following gastric bypass surgery with multiple complications. Now with chronic trach, ESRD, profound debilitation, severe sacral pressure ulcer (stage IV). Was seemingly making progress and transferred to rehab facility approx one week prior to this admission. She was sent to [facility] ED 9/23 with AMS and hypotension. Admitted with sepsis related to infected sacral ulcer.  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  Patient transferred to CCU to med-surg floor during hospitalization, but experienced profound interdialytic hypotension requiring return transfer to CCU for use of pressors (now off).  New orders received to resume PT; cleared by RN for participation as tolerated.  Noted with R hip intertrochanteric fracture, recommended for conservative management per orthopedics.   OT comments  Provided anti-edema massage to both hands. Also mobilization to wrist and finger joints and range of motion to wrist and hand. Patient left with nursing to change her and nursing reported that they will re-elevate her hands.  Follow Up Recommendations       Equipment Recommendations       Recommendations for Other Services      Precautions / Restrictions Precautions Precautions: Fall       Mobility Bed Mobility                  Transfers                      Balance                                   ADL                                                Vision                     Perception     Praxis      Cognition   Behavior During Therapy: Flat  affect                         Extremity/Trunk Assessment               Exercises     Shoulder Instructions       General Comments      Pertinent Vitals/ Pain          Home Living                                          Prior Functioning/Environment              Frequency       Progress Toward Goals  OT Goals(current goals can now be found in the care plan section)        Plan      Co-evaluation  End of Session     Activity Tolerance Patient limited by lethargy   Patient Left in bed (with nursing in to change her)   Nurse Communication          Time: [id number] OT Time Calculation (min): 12 min  Charges: OT General Charges $OT Visit: 1 Procedure OT Treatments $Therapeutic Activity: 8-22 mins Ocie CornfieldJohn M Shaylan Tutton, MS/OTR/L  [name], Nicie Milan M 09/23/2015, 3:51 PM

## 2015-09-23 NOTE — Progress Notes (Signed)
Pre-hd tx 

## 2015-09-23 NOTE — Progress Notes (Signed)
Subjective:   Patient remains critically ill Did not wake up to interact today Off pressors     Objective:  Vital signs in last 24 hours:  Temp:  [98.6 F (37 C)-98.8 F (37.1 C)] 98.8 F (37.1 C) (11/04 0750) Pulse Rate:  [108-126] 121 (11/04 0750) Resp:  [6-31] 16 (11/04 0750) BP: (85-134)/(45-97) 133/78 mmHg (11/04 0700) SpO2:  [92 %-100 %] 99 % (11/04 0750) FiO2 (%):  [30 %-35 %] 30 % (11/04 0400) Weight:  [95 kg (209 lb 7 oz)] 95 kg (209 lb 7 oz) (11/04 0500)  Weight change: 1 kg (2 lb 3.3 oz) Filed Weights   09/21/15 0900 09/22/15 0500 09/23/15 0500  Weight: 94 kg (207 lb 3.7 oz) 92 kg (202 lb 13.2 oz) 95 kg (209 lb 7 oz)    Intake/Output: I/O last 3 completed shifts: In: 2435 [I.V.:1125; NG/GT:1200; IV Piggyback:110] Out: -    Intake/Output this shift:     Physical Exam: General: No acute distress, critically ill  Head: NG in place  Eyes: anicteric  Neck: Trach in place  Lungs:  Scattered rhonchi, normal effort  Heart: S1S2 +tachycardia  Abdomen:  Soft, nontender, BS present  Extremities: + dependent edema  Neurologic: Eyes closed. Did not interact today  Access:  R IJ permcath.       Basic Metabolic Panel:  Recent Labs Lab 09/18/15 0554 09/20/15 0436 09/21/15 0459 09/23/15 0503  NA 135 139 141 139  K 3.6 3.6 3.7 3.7  CL 98* 101 102 102  CO2 31 32 32 33*  GLUCOSE 305* 164* 69 222*  BUN 46* 37* 46* 35*  CREATININE 1.30* 1.02* 1.26* 1.19*  CALCIUM 7.6* 7.7* 8.1* 7.8*    Liver Function Tests:  Recent Labs Lab 09/18/15 0554  AST 28  ALT 20  ALKPHOS 1487*  BILITOT 0.6  PROT 4.1*  ALBUMIN 1.6*   No results for input(s): LIPASE, AMYLASE in the last 168 hours. No results for input(s): AMMONIA in the last 168 hours.  CBC:  Recent Labs Lab 09/18/15 0554 09/20/15 0436 09/21/15 0459 09/23/15 0503  WBC 9.9 9.5 11.7* 13.6*  HGB 7.2* 7.2* 7.6* 7.3*  HCT 23.0* 23.4* 24.7* 23.6*  MCV 102.6* 103.8* 104.1* 105.1*  PLT 205 194 207 199     Cardiac Enzymes:  Recent Labs Lab 09/21/15 0459  CKTOTAL 10*    BNP: Invalid input(s): POCBNP  CBG:  Recent Labs Lab 09/21/15 2339 09/22/15 0727 09/22/15 1616 09/23/15 0003 09/23/15 0722  GLUCAP 79 94 143* 183* 219*    Microbiology: Results for orders placed or performed during the hospital encounter of 07/31/2015  Blood Culture (routine x 2)     Status: None   Collection Time: 07/27/2015  8:51 AM  Result Value Ref Range Status   Specimen Description BLOOD [name]  Final   Special Requests BOTTLES DRAWN AEROBIC AND ANAEROBIC  3CC  Final   Culture NO GROWTH 5 DAYS  Final   Report Status 08/17/2015 FINAL  Final  Blood Culture (routine x 2)     Status: None   Collection Time: 08/11/2015  9:20 AM  Result Value Ref Range Status   Specimen Description BLOOD LEFT ARM  Final   Special Requests BOTTLES DRAWN AEROBIC AND ANAEROBIC  1CC  Final   Culture NO GROWTH 5 DAYS  Final   Report Status 08/17/2015 FINAL  Final  Wound culture     Status: None   Collection Time: 07/21/2015  9:20 AM  Result Value Ref Range  Status   Specimen Description DECUBITIS  Final   Special Requests Normal  Final   Gram Stain   Final    FEW WBC SEEN MANY GRAM NEGATIVE RODS RARE GRAM POSITIVE COCCI    Culture   Final    HEAVY GROWTH ESCHERICHIA COLI MODERATE GROWTH ENTEROBACTER AEROGENES PROTEUS MIRABILIS HEAVY GROWTH ENTEROCOCCUS SPECIES VRE HAVE INTRINSIC RESISTANCE TO MOST COMMONLY USED ANTIBIOTICS AND THE ABILITY TO ACQUIRE RESISTANCE TO MOST AVAILABLE ANTIBIOTICS.    Report Status 08/16/2015 FINAL  Final   Organism ID, Bacteria ESCHERICHIA COLI  Final   Organism ID, Bacteria ENTEROBACTER AEROGENES  Final   Organism ID, Bacteria PROTEUS MIRABILIS  Final   Organism ID, Bacteria ENTEROCOCCUS SPECIES  Final      Susceptibility   Enterobacter aerogenes - MIC*    CEFTAZIDIME <=1 SENSITIVE Sensitive     CEFAZOLIN >=64 RESISTANT Resistant     CEFTRIAXONE <=1 SENSITIVE Sensitive     CIPROFLOXACIN  <=0.25 SENSITIVE Sensitive     GENTAMICIN <=1 SENSITIVE Sensitive     IMIPENEM 1 SENSITIVE Sensitive     TRIMETH/SULFA <=20 SENSITIVE Sensitive     * MODERATE GROWTH ENTEROBACTER AEROGENES   Escherichia coli - MIC*    AMPICILLIN <=2 SENSITIVE Sensitive     CEFTAZIDIME <=1 SENSITIVE Sensitive     CEFAZOLIN <=4 SENSITIVE Sensitive     CEFTRIAXONE <=1 SENSITIVE Sensitive     CIPROFLOXACIN <=0.25 SENSITIVE Sensitive     GENTAMICIN <=1 SENSITIVE Sensitive     IMIPENEM <=0.25 SENSITIVE Sensitive     TRIMETH/SULFA <=20 SENSITIVE Sensitive     * HEAVY GROWTH ESCHERICHIA COLI   Proteus mirabilis - MIC*    AMPICILLIN >=32 RESISTANT Resistant     CEFTAZIDIME <=1 SENSITIVE Sensitive     CEFAZOLIN 8 SENSITIVE Sensitive     CEFTRIAXONE <=1 SENSITIVE Sensitive     CIPROFLOXACIN <=0.25 SENSITIVE Sensitive     GENTAMICIN <=1 SENSITIVE Sensitive     IMIPENEM 1 SENSITIVE Sensitive     TRIMETH/SULFA <=20 SENSITIVE Sensitive     * PROTEUS MIRABILIS   Enterococcus species - MIC*    AMPICILLIN >=32 RESISTANT Resistant     VANCOMYCIN >=32 RESISTANT Resistant     GENTAMICIN SYNERGY SENSITIVE Sensitive     TETRACYCLINE Value in next row Resistant      RESISTANT>=16    * HEAVY GROWTH ENTEROCOCCUS SPECIES  MRSA PCR Screening     Status: None   Collection Time: 08/08/2015  2:38 PM  Result Value Ref Range Status   MRSA by PCR NEGATIVE NEGATIVE Final    Comment:        The GeneXpert MRSA Assay (FDA approved for NASAL specimens only), is one component of a comprehensive MRSA colonization surveillance program. It is not intended to diagnose MRSA infection nor to guide or monitor treatment for MRSA infections.   Blood culture (single)     Status: None   Collection Time: 08/19/2015  3:36 PM  Result Value Ref Range Status   Specimen Description BLOOD RIGHT ASSIST CONTROL  Final   Special Requests BOTTLES DRAWN AEROBIC AND ANAEROBIC 5ML  Final   Culture NO GROWTH 5 DAYS  Final   Report Status 08/17/2015  FINAL  Final  Wound culture     Status: None   Collection Time: 08/13/15 12:37 PM  Result Value Ref Range Status   Specimen Description WOUND  Final   Special Requests Normal  Final   Gram Stain   Final    FEW  WBC SEEN TOO NUMEROUS TO COUNT GRAM NEGATIVE RODS FEW GRAM POSITIVE COCCI    Culture   Final    HEAVY GROWTH ESCHERICHIA COLI MODERATE GROWTH PROTEUS MIRABILIS LIGHT GROWTH KLEBSIELLA PNEUMONIAE MODERATE GROWTH ENTEROCOCCUS GALLINARUM CRITICAL RESULT CALLED TO, READ BACK BY AND VERIFIED WITH: [facility] BORBA AT 1042 08/16/15 DV    Report Status 08/17/2015 FINAL  Final   Organism ID, Bacteria ESCHERICHIA COLI  Final   Organism ID, Bacteria PROTEUS MIRABILIS  Final   Organism ID, Bacteria KLEBSIELLA PNEUMONIAE  Final   Organism ID, Bacteria ENTEROCOCCUS GALLINARUM  Final      Susceptibility   Escherichia coli - MIC*    AMPICILLIN >=32 RESISTANT Resistant     CEFTAZIDIME 4 RESISTANT Resistant     CEFAZOLIN >=64 RESISTANT Resistant     CEFTRIAXONE 16 RESISTANT Resistant     GENTAMICIN 2 SENSITIVE Sensitive     IMIPENEM >=16 RESISTANT Resistant     TRIMETH/SULFA <=20 SENSITIVE Sensitive     Extended ESBL POSITIVE Resistant     PIP/TAZO Value in next row Resistant      RESISTANT>=128    CIPROFLOXACIN Value in next row Sensitive      SENSITIVE<=0.25    * HEAVY GROWTH ESCHERICHIA COLI   Klebsiella pneumoniae - MIC*    AMPICILLIN Value in next row Resistant      SENSITIVE<=0.25    CEFTAZIDIME Value in next row Resistant      SENSITIVE<=0.25    CEFAZOLIN Value in next row Resistant      SENSITIVE<=0.25    CEFTRIAXONE Value in next row Resistant      SENSITIVE<=0.25    CIPROFLOXACIN Value in next row Resistant      SENSITIVE<=0.25    GENTAMICIN Value in next row Sensitive      SENSITIVE<=0.25    IMIPENEM Value in next row Resistant      SENSITIVE<=0.25    TRIMETH/SULFA Value in next row Resistant      SENSITIVE<=0.25    PIP/TAZO Value in next row Resistant       RESISTANT>=128    * LIGHT GROWTH KLEBSIELLA PNEUMONIAE   Proteus mirabilis - MIC*    AMPICILLIN Value in next row Resistant      RESISTANT>=128    CEFTAZIDIME Value in next row Sensitive      RESISTANT>=128    CEFAZOLIN Value in next row Sensitive      RESISTANT>=128    CEFTRIAXONE Value in next row Sensitive      RESISTANT>=128    CIPROFLOXACIN Value in next row Sensitive      RESISTANT>=128    GENTAMICIN Value in next row Sensitive      RESISTANT>=128    IMIPENEM Value in next row Sensitive      RESISTANT>=128    TRIMETH/SULFA Value in next row Sensitive      RESISTANT>=128    PIP/TAZO Value in next row Sensitive      SENSITIVE<=4    * MODERATE GROWTH PROTEUS MIRABILIS   Enterococcus gallinarum - MIC*    AMPICILLIN Value in next row Resistant      SENSITIVE<=4    GENTAMICIN SYNERGY Value in next row Sensitive      SENSITIVE<=4    CIPROFLOXACIN Value in next row Resistant      RESISTANT>=8    TETRACYCLINE Value in next row Resistant      RESISTANT>=16    * MODERATE GROWTH ENTEROCOCCUS GALLINARUM  Wound culture     Status: None   Collection  Time: 08/15/15  2:53 PM  Result Value Ref Range Status   Specimen Description WOUND  Final   Special Requests NONE  Final   Gram Stain FEW WBC SEEN NO ORGANISMS SEEN   Final   Culture NO GROWTH 3 DAYS  Final   Report Status 08/18/2015 FINAL  Final  C difficile quick scan w PCR reflex     Status: None   Collection Time: 08/17/15 11:34 AM  Result Value Ref Range Status   C Diff antigen NEGATIVE NEGATIVE Final   C Diff toxin NEGATIVE NEGATIVE Final   C Diff interpretation Negative for C. difficile  Final  Stool culture     Status: None   Collection Time: 09/03/15  3:51 PM  Result Value Ref Range Status   Specimen Description STOOL  Final   Special Requests Immunocompromised  Final   Culture   Final    NO SALMONELLA OR SHIGELLA ISOLATED No Pathogenic E. coli detected NO CAMPYLOBACTER DETECTED    Report Status 09/07/2015 FINAL   Final  C difficile quick scan w PCR reflex     Status: None   Collection Time: 09/13/15 12:51 AM  Result Value Ref Range Status   C Diff antigen NEGATIVE NEGATIVE Final   C Diff toxin NEGATIVE NEGATIVE Final   C Diff interpretation Negative for C. difficile  Final  Culture, blood (routine x 2)     Status: None   Collection Time: 09/16/15 11:24 AM  Result Value Ref Range Status   Specimen Description BLOOD LEFT HAND  Final   Special Requests BOTTLES DRAWN AEROBIC AND ANAEROBIC  1CC  Final   Culture NO GROWTH 5 DAYS  Final   Report Status 09/21/2015 FINAL  Final  Culture, blood (routine x 2)     Status: None   Collection Time: 09/16/15 12:23 PM  Result Value Ref Range Status   Specimen Description BLOOD RIGHT HAND  Final   Special Requests BOTTLES DRAWN AEROBIC AND ANAEROBIC  1CC  Final   Culture  Setup Time   Final    GRAM POSITIVE COCCI AEROBIC BOTTLE ONLY CRITICAL RESULT CALLED TO, READ BACK BY AND VERIFIED WITH: TESS THOMAS,RN 09/17/2015 0631 BY JRS.    Culture   Final    ENTEROCOCCUS FAECALIS AEROBIC BOTTLE ONLY VRE HAVE INTRINSIC RESISTANCE TO MOST COMMONLY USED ANTIBIOTICS AND THE ABILITY TO ACQUIRE RESISTANCE TO MOST AVAILABLE ANTIBIOTICS. CRITICAL RESULT CALLED TO, READ BACK BY AND VERIFIED WITH: CHERYL SMITH AT 7829 09/19/15 DV    Report Status 09/21/2015 FINAL  Final   Organism ID, Bacteria ENTEROCOCCUS FAECALIS  Final      Susceptibility   Enterococcus faecalis - MIC*    AMPICILLIN <=2 SENSITIVE Sensitive     LINEZOLID 2 SENSITIVE Sensitive     CIPROFLOXACIN Value in next row Resistant      RESISTANT>=8    TETRACYCLINE Value in next row Resistant      RESISTANT>=16    VANCOMYCIN Value in next row Resistant      RESISTANT>=32    GENTAMICIN SYNERGY Value in next row Resistant      RESISTANT>=32    * ENTEROCOCCUS FAECALIS  Culture, respiratory (NON-Expectorated)     Status: None   Collection Time: 09/16/15  3:50 PM  Result Value Ref Range Status   Specimen  Description TRACHEAL ASPIRATE  Final   Special Requests Immunocompromised  Final   Gram Stain   Final    FEW WBC SEEN GOOD SPECIMEN - 80-90% WBCS RARE GRAM  NEGATIVE RODS    Culture LIGHT GROWTH PSEUDOMONAS AERUGINOSA  Final   Report Status 09/19/2015 FINAL  Final   Organism ID, Bacteria PSEUDOMONAS AERUGINOSA  Final      Susceptibility   Pseudomonas aeruginosa - MIC*    CEFTAZIDIME 8 SENSITIVE Sensitive     CIPROFLOXACIN 2 INTERMEDIATE Intermediate     GENTAMICIN >=16 RESISTANT Resistant     IMIPENEM >=16 RESISTANT Resistant     PIP/TAZO Value in next row Sensitive      SENSITIVE32    CEFEPIME Value in next row Sensitive      SENSITIVE8    LEVOFLOXACIN Value in next row Resistant      RESISTANT>=8    * LIGHT GROWTH PSEUDOMONAS AERUGINOSA  Culture, expectorated sputum-assessment     Status: None (Preliminary result)   Collection Time: 09/22/15  2:09 PM  Result Value Ref Range Status   Specimen Description ENDOTRACHEAL  Final   Special Requests Normal  Final   Sputum evaluation THIS SPECIMEN IS ACCEPTABLE FOR SPUTUM CULTURE  Final   Report Status PENDING  Incomplete    Coagulation Studies: No results for input(s): LABPROT, INR in the last 72 hours.  Urinalysis: No results for input(s): COLORURINE, LABSPEC, PHURINE, GLUCOSEU, HGBUR, BILIRUBINUR, KETONESUR, PROTEINUR, UROBILINOGEN, NITRITE, LEUKOCYTESUR in the last 72 hours.  Invalid input(s): APPERANCEUR    Imaging: Dg Chest 1 View  09/21/2015  CLINICAL DATA:  Shortness of breath. EXAM: CHEST 1 VIEW COMPARISON:  Chest x-ray dated 09/18/2015 FINDINGS: Tracheostomy tube, feeding tube and 2 central catheters remain in place, unchanged. Persistent cardiomegaly. Pulmonary vascularity is normal. New atelectasis and consolidation at the left lung base medially with air bronchograms. IMPRESSION: New infiltrate and atelectasis at the left lung base. Electronically Signed   By: Francene Boyers M.D.   On: 09/21/2015 15:17      Medications:   . feeding supplement (VITAL AF 1.2 CAL) 1,000 mL (09/22/15 1900)   . acetylcysteine  3 mL Nebulization Q4H  . antiseptic oral rinse  7 mL Mouth Rinse q12n4p  . chlorhexidine  15 mL Mouth Rinse BID  . DAPTOmycin (CUBICIN)  IV  500 mg Intravenous Q48H  . epoetin (EPOGEN/PROCRIT) injection  10,000 Units Intravenous Q M,W,F-HD  . feeding supplement (PRO-STAT SUGAR FREE 64)  30 mL Per Tube 6 times per day  . folic acid  1 mg Oral Daily  . free water  20 mL Per Tube 6 times per day  . heparin subcutaneous  5,000 Units Subcutaneous Q12H  . hydrocortisone  10 mg Per Tube BID  . ipratropium-albuterol  3 mL Nebulization Q4H  . lidocaine  1 patch Transdermal Q24H  . megestrol  400 mg Oral BID  . midodrine  5 mg Per Tube TID WC  . multivitamin  5 mL Oral Daily  . nystatin   Topical TID  . sodium chloride  10-40 mL Intracatheter Q12H  . sodium hypochlorite   Irrigation BID  . thiamine IV  100 mg Intravenous Daily   sodium chloride, sodium chloride, acetaminophen (TYLENOL) oral liquid 160 mg/5 mL, HYDROmorphone (DILAUDID) injection, morphine injection, oxyCODONE, sodium chloride  Assessment/ Plan:  49 y.o. 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 Admitted on Sep 03, 2015  1. End-stage renal disease on hemodialysis on HD MWF. The patient has been on dialysis since October of 2014. R IJ permcath.   - Plan on  continuing hemodialysis on MWF schedule.  - midodine and albumin with treatment.   -  UF 1000 cc achieved with HD on Wednesday -  HD today. UF as tolerated  2. Anemia of CKD: pt continues to require periodic blood transfusions, hemoglobin of 7.3 today - epo has not been as effective as hoped secondary due to chronic inflammation from decubitus ulcer/infection   continue epogen with HD. This has been ordered as a standing order.   3. Secondary hyperparathyroidism: PTH low at 55 - phos was acceptable at 3.1 - no binders  4. ID: VRE in blood Pseudomonas in tracheal aspirate Treatment as per ID and ICU team  5. Generalized Dependent edema due to 3rd spacing - UF with dialysis , as tolerated   LOS: 42 Krishna Heuer 11/4/20168:46 AM

## 2015-09-23 NOTE — Progress Notes (Signed)
Spoke with Dr Excell Seltzerooper and Micro lab Have ordered cultures for the bone curretage- ordered as tissue culture, anaerobic cx and fungal culture

## 2015-09-23 NOTE — Progress Notes (Signed)
I was asked by infectious disease consultant to perform a bone scraping of the sacrum for culture purposes. Patient is seen in the ICU but family is not present. I will see them later today.

## 2015-09-23 NOTE — Progress Notes (Signed)
HD tx start 

## 2015-09-23 NOTE — Progress Notes (Signed)
HD tx completed.

## 2015-09-23 NOTE — Progress Notes (Signed)
Inpatient Diabetes Program Recommendations  AACE/ADA: New Consensus Statement on Inpatient Glycemic Control (2015)  Target Ranges:  Prepandial:   less than 140 mg/dL      Peak postprandial:   less than 180 mg/dL (1-2 hours)      Critically ill patients:  140 - 180 mg/dL   Review of Glycemic Control  Results for [facility] Armanda HeritageMONROE, Caroleann (MRN [id number]) as of 09/23/2015 09:49  Ref. Range 09/22/2015 14:09 09/22/2015 16:16 09/23/2015 00:03 09/23/2015 05:03 09/23/2015 07:22  Glucose-Capillary Latest Ref Range: 65-99 mg/dL  409143 (H) 811183 (H)  914219 (H)    Diabetes history: Type 2 Outpatient Diabetes medications: Humalog 4 times per day based on sliding scale Current orders for Inpatient glycemic control: none  Inpatient Diabetes Program Recommendations: Patient started on tube feeds yesterday- blood sugars beginning to elevate. Consider starting Novolog correction insulin 0-9 units q4h  Susette RacerJulie Aaren Krog, RN, [place], [place]HA, CDE Diabetes Coordinator Inpatient Diabetes Program  [phone] (Team Pager) [phone] [facility] Office) 09/23/2015 9:51 AM

## 2015-09-23 NOTE — Progress Notes (Signed)
   09/23/15 1500  Clinical Encounter Type  Visited With Patient;Health care provider  Visit Type Follow-up  Consult/Referral To Chaplain  Spiritual Encounters  Spiritual Needs Other (Comment)  Stress Factors  Patient Stress Factors None identified  chaplain rounded in unit and went in to speak with patient. Patient appeared to smile when she saw me. Offered a compassionate presence. Chaplain Zanden Colver A. Kaysia Willard Ext. [phone]

## 2015-09-23 NOTE — Progress Notes (Signed)
ANTIBIOTIC CONSULT NOTE - Follow Up  Pharmacy Consult for daptomycin  Indication: Bacteremia   Allergies  Allergen Reactions  . Contrast Media [Iodinated Diagnostic Agents] Anaphylaxis  . Ampicillin Rash    Patient Measurements: Height: 5\' 9"  (175.3 cm) Weight: 209 lb 7 oz (95 kg) IBW/kg (Calculated) : 66.2  Vital Signs: Temp: 98.3 F (36.8 C) (11/04 0930) Temp Source: Axillary (11/04 0930) BP: 96/68 mmHg (11/04 1300) Pulse Rate: 115 (11/04 1300) Intake/Output from previous day: 11/03 0701 - 11/04 0700 In: 1650 [I.V.:340; NG/GT:1200; IV Piggyback:110] Out: -  Intake/Output from this shift: Total I/O In: 170 [NG/GT:170] Out: -   Labs:  Recent Labs  09/21/15 0459 09/23/15 0503  WBC 11.7* 13.6*  HGB 7.6* 7.3*  PLT 207 199  CREATININE 1.26* 1.19*   Estimated Creatinine Clearance: 69.4 mL/min (by C-G formula based on Cr of 1.19). No results for input(s): VANCOTROUGH, VANCOPEAK, VANCORANDOM, GENTTROUGH, GENTPEAK, GENTRANDOM, TOBRATROUGH, TOBRAPEAK, TOBRARND, AMIKACINPEAK, AMIKACINTROU, AMIKACIN in the last 72 hours.   Microbiology: Recent Results (from the past 720 hour(s))  Stool culture     Status: None   Collection Time: 09/03/15  3:51 PM  Result Value Ref Range Status   Specimen Description STOOL  Final   Special Requests Immunocompromised  Final   Culture   Final    NO SALMONELLA OR SHIGELLA ISOLATED No Pathogenic E. coli detected NO CAMPYLOBACTER DETECTED    Report Status 09/07/2015 FINAL  Final  C difficile quick scan w PCR reflex     Status: None   Collection Time: 09/13/15 12:51 AM  Result Value Ref Range Status   C Diff antigen NEGATIVE NEGATIVE Final   C Diff toxin NEGATIVE NEGATIVE Final   C Diff interpretation Negative for C. difficile  Final  Culture, blood (routine x 2)     Status: None   Collection Time: 09/16/15 11:24 AM  Result Value Ref Range Status   Specimen Description BLOOD LEFT HAND  Final   Special Requests BOTTLES DRAWN AEROBIC  AND ANAEROBIC  1CC  Final   Culture NO GROWTH 5 DAYS  Final   Report Status 09/21/2015 FINAL  Final  Culture, blood (routine x 2)     Status: None   Collection Time: 09/16/15 12:23 PM  Result Value Ref Range Status   Specimen Description BLOOD RIGHT HAND  Final   Special Requests BOTTLES DRAWN AEROBIC AND ANAEROBIC  1CC  Final   Culture  Setup Time   Final    GRAM POSITIVE COCCI AEROBIC BOTTLE ONLY CRITICAL RESULT CALLED TO, READ BACK BY AND VERIFIED WITH: TESS THOMAS,RN 09/17/2015 0631 BY JRS.    Culture   Final    ENTEROCOCCUS FAECALIS AEROBIC BOTTLE ONLY VRE HAVE INTRINSIC RESISTANCE TO MOST COMMONLY USED ANTIBIOTICS AND THE ABILITY TO ACQUIRE RESISTANCE TO MOST AVAILABLE ANTIBIOTICS. CRITICAL RESULT CALLED TO, READ BACK BY AND VERIFIED WITH: CHERYL SMITH AT [id number] 09/19/15 DV    Report Status 09/21/2015 FINAL  Final   Organism ID, Bacteria ENTEROCOCCUS FAECALIS  Final      Susceptibility   Enterococcus faecalis - MIC*    AMPICILLIN <=2 SENSITIVE Sensitive     LINEZOLID 2 SENSITIVE Sensitive     CIPROFLOXACIN Value in next row Resistant      RESISTANT>=8    TETRACYCLINE Value in next row Resistant      RESISTANT>=16    VANCOMYCIN Value in next row Resistant      RESISTANT>=32    GENTAMICIN SYNERGY Value in next row Resistant  RESISTANT>=32    * ENTEROCOCCUS FAECALIS  Culture, respiratory (NON-Expectorated)     Status: None   Collection Time: 09/16/15  3:50 PM  Result Value Ref Range Status   Specimen Description TRACHEAL ASPIRATE  Final   Special Requests Immunocompromised  Final   Gram Stain   Final    FEW WBC SEEN GOOD SPECIMEN - 80-90% WBCS RARE GRAM NEGATIVE RODS    Culture LIGHT GROWTH PSEUDOMONAS AERUGINOSA  Final   Report Status 09/19/2015 FINAL  Final   Organism ID, Bacteria PSEUDOMONAS AERUGINOSA  Final      Susceptibility   Pseudomonas aeruginosa - MIC*    CEFTAZIDIME 8 SENSITIVE Sensitive     CIPROFLOXACIN 2 INTERMEDIATE Intermediate     GENTAMICIN  >=16 RESISTANT Resistant     IMIPENEM >=16 RESISTANT Resistant     PIP/TAZO Value in next row Sensitive      SENSITIVE32    CEFEPIME Value in next row Sensitive      SENSITIVE8    LEVOFLOXACIN Value in next row Resistant      RESISTANT>=8    * LIGHT GROWTH PSEUDOMONAS AERUGINOSA  Culture, expectorated sputum-assessment     Status: None (Preliminary result)   Collection Time: 09/22/15  2:09 PM  Result Value Ref Range Status   Specimen Description ENDOTRACHEAL  Final   Special Requests Normal  Final   Sputum evaluation THIS SPECIMEN IS ACCEPTABLE FOR SPUTUM CULTURE  Final   Report Status PENDING  Incomplete  Culture, respiratory (NON-Expectorated)     Status: None (Preliminary result)   Collection Time: 09/22/15  2:09 PM  Result Value Ref Range Status   Specimen Description ENDOTRACHEAL  Final   Special Requests Normal Reflexed from [id number]  Final   Gram Stain PENDING  Incomplete   Culture HOLDING FOR POSSIBLE PATHOGEN  Final   Report Status PENDING  Incomplete    Medical History: Past Medical History  Diagnosis Date  . Obesity   . Dyslipidemia   . Hypertension   . Coronary artery disease     s/p BMS 2010 LAD  . Dysrhythmia     ventricular tachycardia resolved after LAD stent and beta blocker  . Diabetes mellitus     with retinopathy, neuropathy and microalbuminemia  . ESRD (end stage renal disease) on dialysis     Assessment: 49 yo female admitted on Sep 01, 2015 with urosepsis and  infected sacral decubitus ulcer. Currently patient is being treated for 1 of 2 positive blood cultures growing VRE from 10/28. Dr. Sampson Goon requested daptomycin be initiated on patient.  She is an MWF HD patient. Daptomycin /kg every 48 hours was initiated.    Plan:  Will continue treatment with daptomycin  every 48 hours. Pharmacy will continue to monitor labs and renal function and make adjustments as needed. Recommend checking for increase in CPK levels at least once a week.   Luisa Hart, PharmD

## 2015-09-23 NOTE — Clinical Social Work Note (Signed)
Patient still has bed at [facility] but patient is not stable for transfer and patient's hospitalization has been prolonged and complex. Family meeting to be held with physicians on Sunday.  York SpanielMonica Joseline Mccampbell MSW,LCSW [phone]

## 2015-09-24 LAB — BLOOD GAS, ARTERIAL
ACID-BASE EXCESS: 8.6 mmol/L — AB (ref 0.0–3.0)
ACID-BASE EXCESS: 8.9 mmol/L — AB (ref 0.0–3.0)
ALLENS TEST (PASS/FAIL): POSITIVE — AB
Allens test (pass/fail): POSITIVE — AB
BICARBONATE: 37.7 meq/L — AB (ref 21.0–28.0)
Bicarbonate: 35.6 mEq/L — ABNORMAL HIGH (ref 21.0–28.0)
FIO2: 100
FIO2: 50
LHR: 14 {breaths}/min
MECHVT: 500 mL
O2 SAT: 96.3 %
O2 Saturation: 97.4 %
PATIENT TEMPERATURE: 37
PCO2 ART: 90 mmHg — AB (ref 32.0–48.0)
PCO2 ART: 63 mmHg — AB (ref 32.0–48.0)
PEEP: 5 cmH2O
PH ART: 7.23 — AB (ref 7.350–7.450)
PH ART: 7.36 (ref 7.350–7.450)
PO2 ART: 87 mmHg (ref 83.0–108.0)
Patient temperature: 37
pO2, Arterial: 111 mmHg — ABNORMAL HIGH (ref 83.0–108.0)

## 2015-09-24 LAB — GLUCOSE, CAPILLARY
GLUCOSE-CAPILLARY: 93 mg/dL (ref 65–99)
Glucose-Capillary: 105 mg/dL — ABNORMAL HIGH (ref 65–99)
Glucose-Capillary: 114 mg/dL — ABNORMAL HIGH (ref 65–99)
Glucose-Capillary: 137 mg/dL — ABNORMAL HIGH (ref 65–99)
Glucose-Capillary: 164 mg/dL — ABNORMAL HIGH (ref 65–99)
Glucose-Capillary: 171 mg/dL — ABNORMAL HIGH (ref 65–99)
Glucose-Capillary: 93 mg/dL (ref 65–99)

## 2015-09-24 LAB — EXPECTORATED SPUTUM ASSESSMENT W REFEX TO RESP CULTURE

## 2015-09-24 LAB — EXPECTORATED SPUTUM ASSESSMENT W GRAM STAIN, RFLX TO RESP C: Special Requests: NORMAL

## 2015-09-24 MED ORDER — NOREPINEPHRINE 4 MG/250ML-% IV SOLN
INTRAVENOUS | Status: AC
  Administered 2015-09-24: 4 mg
  Filled 2015-09-24: qty 250

## 2015-09-24 MED ORDER — NOREPINEPHRINE 4 MG/250ML-% IV SOLN
2.0000 ug/min | INTRAVENOUS | Status: DC
  Administered 2015-09-24: 5 ug/min via INTRAVENOUS
  Administered 2015-09-25: 2 ug/min via INTRAVENOUS
  Filled 2015-09-24: qty 250

## 2015-09-24 MED ORDER — NALOXONE HCL 0.4 MG/ML IJ SOLN
INTRAMUSCULAR | Status: AC
  Administered 2015-09-24: 0.4 mg via INTRAVENOUS
  Filled 2015-09-24: qty 1

## 2015-09-24 MED ORDER — DEXTROSE 5 % IV SOLN
2.0000 ug/min | INTRAVENOUS | Status: DC
  Administered 2015-09-24: 3 ug/min via INTRAVENOUS
  Administered 2015-09-24: 10 ug/min via INTRAVENOUS

## 2015-09-24 MED ORDER — NALOXONE HCL 0.4 MG/ML IJ SOLN
0.4000 mg | Freq: Once | INTRAMUSCULAR | Status: AC
  Administered 2015-09-24: 0.4 mg via INTRAVENOUS

## 2015-09-24 NOTE — Progress Notes (Signed)
eLink Physician-Brief Progress Note Patient Name: Courtney GrinderHedda McMillian [name] DOB: 06-21-1966 MRN: [id number]   Date of Service  09/24/2015  HPI/Events of Note  ABG back on ventilator = 7.36/63/87/35.6. Tach cuff not staying inflated.   eICU Interventions  Continue current ventilator settings. Called Dr. Belia HemanKasa to change the trach. Dr. Belia HemanKasa says that Respiratory Therapy can change the trach and he will call them.      Intervention Category Major Interventions: Respiratory failure - evaluation and management  Sommer,Steven Eugene 09/24/2015, 6:35 PM

## 2015-09-24 NOTE — Progress Notes (Signed)
ANTIBIOTIC CONSULT NOTE - Follow Up  Pharmacy Consult for daptomycin  Indication: Bacteremia   Allergies  Allergen Reactions  . Contrast Media [Iodinated Diagnostic Agents] Anaphylaxis  . Ampicillin Rash    Patient Measurements: Height:  (175.3 cm) Weight: 200 lb 9.9 oz (91 kg) IBW/kg (Calculated) : 66.2  Vital Signs: Temp: 97.5 F (36.4 C) (11/05 0200) Temp Source: Axillary (11/05 0200) BP: 107/67 mmHg (11/05 0700) Pulse Rate: 110 (11/05 0700) Intake/Output from previous day: 11/04 0701 - 11/05 0700 In: 1260 [NG/GT:1260] Out: 347  Intake/Output from this shift:    Labs:  Recent Labs  09/23/15 0503  WBC 13.6*  HGB 7.3*  PLT 199  CREATININE 1.19*   Estimated Creatinine Clearance: 67.9 mL/min (by C-G formula based on Cr of 1.19). No results for input(s): VANCOTROUGH, VANCOPEAK, VANCORANDOM, GENTTROUGH, GENTPEAK, GENTRANDOM, TOBRATROUGH, TOBRAPEAK, TOBRARND, AMIKACINPEAK, AMIKACINTROU, AMIKACIN in the last 72 hours.   Microbiology: Recent Results (from the past 720 hour(s))  Stool culture     Status: None   Collection Time: 09/03/15  3:51 PM  Result Value Ref Range Status   Specimen Description STOOL  Final   Special Requests Immunocompromised  Final   Culture   Final    NO SALMONELLA OR SHIGELLA ISOLATED No Pathogenic E. coli detected NO CAMPYLOBACTER DETECTED    Report Status 09/07/2015 FINAL  Final  C difficile quick scan w PCR reflex     Status: None   Collection Time: 09/13/15 12:51 AM  Result Value Ref Range Status   C Diff antigen NEGATIVE NEGATIVE Final   C Diff toxin NEGATIVE NEGATIVE Final   C Diff interpretation Negative for C. difficile  Final  Culture, blood (routine x 2)     Status: None   Collection Time: 09/16/15 11:24 AM  Result Value Ref Range Status   Specimen Description BLOOD LEFT HAND  Final   Special Requests BOTTLES DRAWN AEROBIC AND ANAEROBIC  1CC  Final   Culture NO GROWTH 5 DAYS  Final   Report Status 09/21/2015 FINAL   Final  Culture, blood (routine x 2)     Status: None   Collection Time: 09/16/15 12:23 PM  Result Value Ref Range Status   Specimen Description BLOOD RIGHT HAND  Final   Special Requests BOTTLES DRAWN AEROBIC AND ANAEROBIC  1CC  Final   Culture  Setup Time   Final    GRAM POSITIVE COCCI AEROBIC BOTTLE ONLY CRITICAL RESULT CALLED TO, READ BACK BY AND VERIFIED WITH: TESS THOMAS,RN 09/17/2015 0631 BY JRS.    Culture   Final    ENTEROCOCCUS FAECALIS AEROBIC BOTTLE ONLY VRE HAVE INTRINSIC RESISTANCE TO MOST COMMONLY USED ANTIBIOTICS AND THE ABILITY TO ACQUIRE RESISTANCE TO MOST AVAILABLE ANTIBIOTICS. CRITICAL RESULT CALLED TO, READ BACK BY AND VERIFIED WITH: CHERYL SMITH AT 1191 09/19/15 DV    Report Status 09/21/2015 FINAL  Final   Organism ID, Bacteria ENTEROCOCCUS FAECALIS  Final      Susceptibility   Enterococcus faecalis - MIC*    AMPICILLIN <=2 SENSITIVE Sensitive     LINEZOLID 2 SENSITIVE Sensitive     CIPROFLOXACIN Value in next row Resistant      RESISTANT>=8    TETRACYCLINE Value in next row Resistant      RESISTANT>=16    VANCOMYCIN Value in next row Resistant      RESISTANT>=32    GENTAMICIN SYNERGY Value in next row Resistant      RESISTANT>=32    * ENTEROCOCCUS FAECALIS  Culture, respiratory (NON-Expectorated)  Status: None   Collection Time: 09/16/15  3:50 PM  Result Value Ref Range Status   Specimen Description TRACHEAL ASPIRATE  Final   Special Requests Immunocompromised  Final   Gram Stain   Final    FEW WBC SEEN GOOD SPECIMEN - 80-90% WBCS RARE GRAM NEGATIVE RODS    Culture LIGHT GROWTH PSEUDOMONAS AERUGINOSA  Final   Report Status 09/19/2015 FINAL  Final   Organism ID, Bacteria PSEUDOMONAS AERUGINOSA  Final      Susceptibility   Pseudomonas aeruginosa - MIC*    CEFTAZIDIME 8 SENSITIVE Sensitive     CIPROFLOXACIN 2 INTERMEDIATE Intermediate     GENTAMICIN >=16 RESISTANT Resistant     IMIPENEM >=16 RESISTANT Resistant     PIP/TAZO Value in next row  Sensitive      SENSITIVE32    CEFEPIME Value in next row Sensitive      SENSITIVE8    LEVOFLOXACIN Value in next row Resistant      RESISTANT>=8    * LIGHT GROWTH PSEUDOMONAS AERUGINOSA  Culture, expectorated sputum-assessment     Status: None   Collection Time: 09/22/15  2:09 PM  Result Value Ref Range Status   Specimen Description ENDOTRACHEAL  Final   Special Requests Normal  Final   Sputum evaluation THIS SPECIMEN IS ACCEPTABLE FOR SPUTUM CULTURE  Final   Report Status 09/24/2015 FINAL  Final  Culture, respiratory (NON-Expectorated)     Status: None (Preliminary result)   Collection Time: 09/22/15  2:09 PM  Result Value Ref Range Status   Specimen Description ENDOTRACHEAL  Final   Special Requests Normal Reflexed from [id number]  Final   Gram Stain PENDING  Incomplete   Culture   Final    MODERATE GROWTH GRAM NEGATIVE RODS IDENTIFICATION AND SUSCEPTIBILITIES TO FOLLOW    Report Status PENDING  Incomplete    Medical History: Past Medical History  Diagnosis Date  . Obesity   . Dyslipidemia   . Hypertension   . Coronary artery disease     s/p BMS 2010 LAD  . Dysrhythmia     ventricular tachycardia resolved after LAD stent and beta blocker  . Diabetes mellitus     with retinopathy, neuropathy and microalbuminemia  . ESRD (end stage renal disease) on dialysis     Assessment: 49 yo female admitted on 10-21-15 with urosepsis and  infected sacral decubitus ulcer. Currently patient is being treated for 1 of 2 positive blood cultures growing VRE from 10/28. Dr. [name] requested daptomycin be initiated on patient.  She is an MWF HD patient. Daptomycin 6mg /kg every 48 hours was initiated.    Plan:  Will continue treatment with daptomycin 500mg  every 48 hours. Pharmacy will continue to monitor labs and renal function and make adjustments as needed. Recommend checking for increase in CPK levels at least once a week.   Garlon HatchetJody Bianna Haran, PharmD Clinical Pharmacist  09/24/2015 10:39  AM

## 2015-09-24 NOTE — Progress Notes (Signed)
eLink Physician-Brief Progress Note Patient Name: Courtney [name] DOB: [birth year] MRN: [id number]   Date of Service  09/24/2015  HPI/Events of Note  No response to Narcan and now hypotensive. Patient with ESRD.   eICU Interventions  Will order: 1. Ventilation: 100%/PRVC 14/TV 8 mL/kg/P 5 2. ABG at 6 PM.  3. Norepinephrine IV infusion. Titrate to MAP >= 65.      Intervention Category Major Interventions: Respiratory failure - evaluation and management  Teodora Baumgarten Eugene 09/24/2015, 4:55 PM

## 2015-09-24 NOTE — Progress Notes (Signed)
eLink Physician-Brief Progress Note Patient Name: Courtney [name] DOB: 11-12-1966 MRN: [id number]   Date of Service  09/24/2015  HPI/Events of Note  PCO2 = 90 on 100% TC.   eICU Interventions  Will order: 1. Narcan 0.4 mg IV now.   If she does not perk up within 5 minutes post Narcan, she will need to go back on the ventilator.      Intervention Category Major Interventions: Hypercarbia - evaluation and management  [name] 09/24/2015, 4:41 PM

## 2015-09-24 NOTE — Progress Notes (Signed)
[facility] Critical Care Medicine Progess Note  Name: Courtney [name] MRN: [id number] DOB: Jan 20, 1966    ADMISSION DATE:  08/05/2015     INITIAL PRESENTATION:  49 AAF who has been in medical facilities (hosp, LTAC, rehab) for 2 yrs following gastric bypass surgery with multiple complications. Now with chronic trach, ESRD, profound debilitation, severe sacral pressure ulcer. Was seemingly making progress and transferred to rehab facility approx one week prior to this admission. She was sent to [facility] ED with AMS and hypotension. Working dx of severe sepsis/septic shock due to infected sacral pressure ulcer. Since admission. Her course is been very complicated with numerous complications including septic shock and GI bleeding. Currently, she has an NG tube in place, she takes in minimal by mouth nutrition, though she appears to be able to do this, however, she is refusing to eat. Patient with recurrent sepsis  INTERVAL HISTORY: The patient completed her oral feeding trial with calorie count. She was subsequently placed back on tube feeds, however, the case was discussed with the critical care RN and she continues to routinely refuses oral food intake. The patient is otherwise awake and alert, she appears in no acute distress. We have continued nebulizer treatments with Mucomyst and DuoNeb nebs every 4 with frequent suctioning, per respiratory therapist and critical care RN. The patient continues to have some thick secretions that appeared to be normal in color.  The patient was started on Megace and thiamine, on 09/22/2015 per the husband's request.  MAJOR EVENTS/TEST RESULTS: Admission 02/07/14-05/07/14 Admission 07/21/14-09/06/14 Discharged to [facility]. Pt had palliative consult at that time, were asked to sign off by husband.  09/23 CT head: NAD 09/23 EEG: no epileptiform activity 09/23 PRBCs for Hgb 6.4 09/24 bedside debridement of sacral wound. Abscess drained 09/25 Off vasopressors.  More alert. No distress. Worsening thrombocytopenia. Vanc DC'd 09/29  Dr. Sampson Goon (I.D) excused from the case by patient's husband. 10/02 MRI -multiple infarcts 10/03 tracheal bleeding- transfused platelets 10/03 hospitalist service excused from the case by patient's husband 10/03 Echocardiogram ejection fraction was 55-60%, pulmonary systolic pressure was 39 mmHg 13/08 restart TF's at lower rate, attempt reg HD  10/12 Transferred to med-surg floor. Remains on PCCM service 10/14 SLP eval: pt unable to tolerate PMV adequately 10/17-will re-attempt PM valve-discussed with Speech therapist 10/18 passed swallow eval-start pureed thick foods no thin liquids-continue NG feeds 10/19 transferred to step down for sepsis/aspiration pneumonia 10/19 cxr shows RLL opacity 10/21 sacral decub debride at bedside by surgery 10/22 started back on vasopressors while on HD 10/27 CT with osteo, R hip fx, unable to identify tip of dubhoff tube - sent for fluoro study 10/28 Ortho consultation: I do not feel that she is a surgical candidate. Therefore, I feel that it would best to manage this fracture nonsurgically and allow it to heal by itself over time, which it should.  10/28 Gen Surg consultation: Due to the lack of free air or free flow of contrast and the peritoneum there is no indication for any surgical intervention on this. Would recommend pulling back the feeding tube 1-2 cm. Would recheck an abdominal film to confirm no pneumoperitoneum in the morning. Absence of any changes okay to continue using Dobbhoff for feeding and medications. 10/28 gastrograffin study: The study confirms that the feeding tube pes perforated through the duodenum and the tip is within a cavity that fills with injected contrast. The cavity does appear walled off 11/4 refuses oral feeds, continue TF's CT reviewed: T8-T9 discitis/osteomyelitis, RT HIP  FRACTURE  INDWELLING DEVICES:: Trach (chronic) placed June 2014 Tunneled R IJ HD  cath (chronic) Tunneled L IJ CVL (chronic) L femoral A-line 9/23 >> 9/25  MICRO DATA: 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 Pneumoniae, P. Mirabilis, VRE Wound 9/26 >> No growth Blood 9/23 >> NEG CDiff 9/27>>neg Stool Cx 10/15>> negative Cdiff 10/25>>neg Trach Aspirate 10/28>> light growth pseudomonas Blood 10/28 >> 1/2 GPC >>  Sputum cultures obtained 09/22/15 due to mucus plugging, results are pending.  BONE TISSUE CULTURE PENDING  ANTIMICROBIALS:  Aztreonam 9/23 >> 9/24 Vanc 9/23 >> 9/25 Vanc 9/26>>9/27 Daptomycin 9/27>> 10/12, 10/28>>  Meropenem 9/23 >> 10/14, 10/28 >> 10/31   VITAL SIGNS: Temp:  [97.5 F (36.4 C)-98.3 F (36.8 C)] 97.5 F (36.4 C) (11/05 0200) Pulse Rate:  [110-123] 110 (11/05 0700) Resp:  [10-31] 24 (11/05 0700) BP: (81-139)/(23-109) 107/67 mmHg (11/05 0700) SpO2:  [86 %-100 %] 98 % (11/05 0700) FiO2 (%):  [28 %-40 %] 40 % (11/05 0100) Weight:  [200 lb 9.9 oz (91 kg)] 200 lb 9.9 oz (91 kg) (11/05 0500) HEMODYNAMICS:   VENTILATOR SETTINGS: Vent Mode:  [-]  FiO2 (%):  [28 %-40 %] 40 % INTAKE / OUTPUT:  Intake/Output Summary (Last 24 hours) at 09/24/15 0935 Last data filed at 09/24/15 0648  Gross per 24 hour  Intake   1090 ml  Output    347 ml  Net    743 ml    PHYSICAL EXAMINATION: Physical Examination:   VS: BP 107/67 mmHg  Pulse 110  Temp(Src) 97.5 F (36.4 C) (Axillary)  Resp 24  Ht 5\' 9"  (1.753 m)  Wt 200 lb 9.9 oz (91 kg)  BMI 29.61 kg/m2  SpO2 98%  LMP  (LMP Unknown)  General Appearance: No resp distress, coarse upper airway sounds  Neuro: profoundly diffusely weak HEENT: cushingoid facies, PERRLA, EOM intact Neck: trach site clean Pulmonary: clear anteriorly Cardiovascular: reg, + syst murmur Abdomen: soft, NT,  +BS Extremities: warm, R>L UE edema Examination of the sacral decubitus ulcer- showed a very deep ulcer approximately 7-8 cm across.   stage IV ulcer.   LABORATORY PANEL:   CBC  Recent Labs Lab 09/23/15 0503  WBC 13.6*  HGB 7.3*  HCT 23.6*  PLT 199    Chemistries   Recent Labs Lab 09/18/15 0554  09/23/15 0503  NA 135  < > 139  K 3.6  < > 3.7  CL 98*  < > 102  CO2 31  < > 33*  GLUCOSE 305*  < > 222*  BUN 46*  < > 35*  CREATININE 1.30*  < > 1.19*  CALCIUM 7.6*  < > 7.8*  AST 28  --   --   ALT 20  --   --   ALKPHOS 1487*  --   --   BILITOT 0.6  --   --   < > = values in this interval not displayed.   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 BMET intermittently Monitor I/Os Correct electrolytes as indicated Intermittent HD per Renal Service (MWF)  GASTROINTESTINAL A:  LGIB,  resolved Hx of C. Diff Stool incontinence  P:  Cont PPI Resume TFs 10/30  HEMATOLOGIC A:  Acute on chronic anemia S/p Thrombocytopenia - HIT panel negative 9/25, resolved P: DVT px: SCDs + SQ heparin Monitor CBC intermittently Transfuse per usual ICU guidelines  INFECTIOUS A:  Recurrent Severe sepsis, due to sacral decub, currently stable.  Sacral decubitus ulcer with abscess  P:  Monitor temp, WBC count Micro and abx as above  ENDOCRINE A:  -DM 2, controlled. The patient developed some hypoglycemia when the tube feed was stopped and therefore she was started on D10 because she did not take in an adequate amount of calories to maintain her blood glucose levels in the normal range.  -Chronic steroid therapy, for a history of of adrenal insufficiency. P:  Monitor CBGs q 8 hrs - resume  SSI if > 180 Cont hydrocortisone @ 10 mg BID Continue  tube feeds. We'll need to monitor blood glucose levels.  MSK A: Right hip fx - small intertrochanteric R fx - pain management. Ortho evaluated and felt not a surgical candidate at this time.   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  I personally spoke with the husband today and updated him via telephone.  -Mr. Monroe was updated on his wife's condition today, in particular her continued refusal of food. A sputum culture that was obtained,still pending, I let him know that the Megace and thiamine was.  He is hopeful that the addition of the Megace and thiamine will stimulate his wife's appetite and that hopefully she will start eating over the next couple of days.    I have personally obtained a history, examined the patient, evaluated Pertinent laboratory and [employer]/imaging results, and  formulated the assessment and plan   The Patient requires high complexity decision making for assessment and support, frequent evaluation and titration of therapies, application of advanced monitoring technologies and extensive interpretation of multiple databases. Critical Care Time devoted to patient care services described in this note is 45 minutes.   Overall, patient is critically ill, prognosis is guarded.  Patient with Multiorgan failure and at high risk for cardiac arrest and death. prognosis is very poor, patient with very poor chance of meaningful recovery. Patient will prob not walk again, patient with extensive osteomyelitis of the sacrum/coccyx/T8-T9 spine, will need PEG tube for long term    Lucie Leather, M.D.  [facility]  Medical Director [facility] Medical Director [facility] Cardio-Pulmonary  Department

## 2015-09-24 NOTE — Progress Notes (Signed)
Nutrition Follow-up  DOCUMENTATION CODES:   Severe malnutrition in context of acute illness/injury  INTERVENTION:  Coordination of care: Spoke with MD Kasa and wants to continue tube feeding at 68ml/hr at this time, goal rate is 40ml/hr with prostat 6 times per day.   NUTRITION DIAGNOSIS:   Inadequate oral intake related to wound healing, acute illness, chronic illness as evidenced by NPO status, estimated needs.    GOAL:   Patient will meet greater than or equal to 90% of their needs    MONITOR:    (Energy Intake, Anthropometrics, Digestive System, Electrolyte/Renal Profile, Glucose Profile)  REASON FOR ASSESSMENT:   Consult Enteral/tube feeding initiation and management  ASSESSMENT:      Chart reviewed   Current Nutrition: pt refusing to take po   Gastrointestinal Profile: Last BM: 11/4 times 4 noted   Scheduled Medications:  . acetylcysteine  3 mL Nebulization QID  . antiseptic oral rinse  7 mL Mouth Rinse q12n4p  . chlorhexidine  15 mL Mouth Rinse BID  . DAPTOmycin (CUBICIN)  IV  500 mg Intravenous Q48H  . epoetin (EPOGEN/PROCRIT) injection  10,000 Units Intravenous Q M,W,F-HD  . feeding supplement (PRO-STAT SUGAR FREE 64)  30 mL Per Tube 6 times per day  . folic acid  1 mg Oral Daily  . free water  20 mL Per Tube 6 times per day  . heparin subcutaneous  5,000 Units Subcutaneous Q12H  . hydrocortisone  10 mg Per Tube BID  . insulin aspart  0-9 Units Subcutaneous 6 times per day  . ipratropium-albuterol  3 mL Nebulization QID  . lidocaine  1 patch Transdermal Q24H  . megestrol  400 mg Oral BID  . midodrine  5 mg Per Tube TID WC  . multivitamin  5 mL Oral Daily  . nystatin   Topical TID  . sodium chloride  10-40 mL Intracatheter Q12H  . sodium hypochlorite   Irrigation BID  . thiamine IV  100 mg Intravenous Daily    Continuous Medications:  . feeding supplement (VITAL AF 1.2 CAL) 1,000 mL (09/23/15 1800)     Electrolyte/Renal Profile and  Glucose Profile:   Recent Labs Lab 09/20/15 0436 09/21/15 0459 09/23/15 0503  NA 139 141 139  K 3.6 3.7 3.7  CL 101 102 102  CO2 32 32 33*  BUN 37* 46* 35*  CREATININE 1.02* 1.26* 1.19*  CALCIUM 7.7* 8.1* 7.8*  GLUCOSE 164* 69 222*   Protein Profile:  Recent Labs Lab 09/18/15 0554  ALBUMIN 1.6*        Weight Trend since Admission: Filed Weights   09/23/15 0500 09/23/15 0930 09/24/15 0500  Weight: 209 lb 7 oz (95 kg) 209 lb 7 oz (95 kg) 200 lb 9.9 oz (91 kg)      Diet Order:  DIET - DYS 1 Room service appropriate?: Yes; Fluid consistency:: Nectar Thick  Skin:   (stage IV sacrum, stage III hip, unstageable foot; rectal ulcer from fleixseal, ulcer in nose from dobhoff)   Height:   Ht Readings from Last 1 Encounters:  14-Aug-2015  (1.753 m)    Weight:   Wt Readings from Last 1 Encounters:  09/24/15 200 lb 9.9 oz (91 kg)     BMI:  Body mass index is 29.61 kg/(m^2).  Estimated Nutritional Needs:   Kcal:  4098-1191 kcals (BEE 1509, 1.2 AF, 1.2-1.4 IF) or 2490-2905 kcals (30-35 kcals/kg)   Protein:  125-166 g (1.5-2.0 g/kg) but likely closer to 166-208 g (2.0-2.5 g/kg)  Fluid:  1000 mL plus UOP  EDUCATION NEEDS:   No education needs identified at this time  HIGH Care Level  Lashawna Poche B. Freida BusmanAllen, RD, LDN [phone])

## 2015-09-24 NOTE — Progress Notes (Signed)
Assumed care for patient after 1600. Patient was tachypnic with shallow respirations, oxygen saturation WNL. Patient unresponsive to painful stimuli, pupils equal reactive. Hypotensive, placed in trendelenburg (feedings held). Elink MD notified. ABG collected, resulted, narcan ordered and administered with no change in patient condition. Placed back on ventilator per respiratory therapist and Elink MD order. Levophed started to address hypotension. Patient opening eyes, not tracking at this time. Pupils equal reactive. Will continue to monitor. Repeat ABG ordered.

## 2015-09-24 NOTE — Progress Notes (Signed)
Subjective:   Patient remains critically ill Open eyes Off pressors No acute events     Objective:  Vital signs in last 24 hours:  Temp:  [97.5 F (36.4 C)-98.3 F (36.8 C)] 97.5 F (36.4 C) (11/05 0200) Pulse Rate:  [110-123] 110 (11/05 0700) Resp:  [10-31] 24 (11/05 0700) BP: (81-139)/(23-109) 107/67 mmHg (11/05 0700) SpO2:  [86 %-100 %] 98 % (11/05 0700) FiO2 (%):  [28 %-40 %] 40 % (11/05 0100) Weight:  [91 kg (200 lb 9.9 oz)-95 kg (209 lb 7 oz)] 91 kg (200 lb 9.9 oz) (11/05 0500)  Weight change: 0 kg (0 lb) Filed Weights   09/23/15 0500 09/23/15 0930 09/24/15 0500  Weight: 95 kg (209 lb 7 oz) 95 kg (209 lb 7 oz) 91 kg (200 lb 9.9 oz)    Intake/Output: I/O last 3 completed shifts: In: 2050 [NG/GT:2050] Out: 347 [Other:347]   Intake/Output this shift:     Physical Exam: General: No acute distress, critically ill  Head: NG in place  Eyes: anicteric  Neck: Trach in place  Lungs:  Scattered rhonchi, normal effort  Heart: S1S2 +tachycardia  Abdomen:  Soft, nontender, BS present  Extremities: + dependent edema  Neurologic: Opens eyes  Access:  R IJ permcath.       Basic Metabolic Panel:  Recent Labs Lab 09/18/15 0554 09/20/15 0436 09/21/15 0459 09/23/15 0503  NA 135 139 141 139  K 3.6 3.6 3.7 3.7  CL 98* 101 102 102  CO2 31 32 32 33*  GLUCOSE 305* 164* 69 222*  BUN 46* 37* 46* 35*  CREATININE 1.30* 1.02* 1.26* 1.19*  CALCIUM 7.6* 7.7* 8.1* 7.8*    Liver Function Tests:  Recent Labs Lab 09/18/15 0554  AST 28  ALT 20  ALKPHOS 1487*  BILITOT 0.6  PROT 4.1*  ALBUMIN 1.6*   No results for input(s): LIPASE, AMYLASE in the last 168 hours. No results for input(s): AMMONIA in the last 168 hours.  CBC:  Recent Labs Lab 09/18/15 0554 09/20/15 0436 09/21/15 0459 09/23/15 0503  WBC 9.9 9.5 11.7* 13.6*  HGB 7.2* 7.2* 7.6* 7.3*  HCT 23.0* 23.4* 24.7* 23.6*  MCV 102.6* 103.8* 104.1* 105.1*  PLT 205 194 207 199    Cardiac  Enzymes:  Recent Labs Lab 09/21/15 0459  CKTOTAL 10*    BNP: Invalid input(s): POCBNP  CBG:  Recent Labs Lab 09/23/15 1801 09/23/15 2108 09/24/15 0010 09/24/15 0420 09/24/15 0805  GLUCAP 163* 151* 105* 93 93    Microbiology: Results for orders placed or performed during the hospital encounter of 08/16/2015  Blood Culture (routine x 2)     Status: None   Collection Time: 07/24/2015  8:51 AM  Result Value Ref Range Status   Specimen Description BLOOD [name]  Final   Special Requests BOTTLES DRAWN AEROBIC AND ANAEROBIC  3CC  Final   Culture NO GROWTH 5 DAYS  Final   Report Status 08/17/2015 FINAL  Final  Blood Culture (routine x 2)     Status: None   Collection Time: 07/22/2015  9:20 AM  Result Value Ref Range Status   Specimen Description BLOOD LEFT ARM  Final   Special Requests BOTTLES DRAWN AEROBIC AND ANAEROBIC  1CC  Final   Culture NO GROWTH 5 DAYS  Final   Report Status 08/17/2015 FINAL  Final  Wound culture     Status: None   Collection Time: 07/22/2015  9:20 AM  Result Value Ref Range Status   Specimen Description DECUBITIS  Final   Special Requests Normal  Final   Gram Stain   Final    FEW WBC SEEN MANY GRAM NEGATIVE RODS RARE GRAM POSITIVE COCCI    Culture   Final    HEAVY GROWTH ESCHERICHIA COLI MODERATE GROWTH ENTEROBACTER AEROGENES PROTEUS MIRABILIS HEAVY GROWTH ENTEROCOCCUS SPECIES VRE HAVE INTRINSIC RESISTANCE TO MOST COMMONLY USED ANTIBIOTICS AND THE ABILITY TO ACQUIRE RESISTANCE TO MOST AVAILABLE ANTIBIOTICS.    Report Status 08/16/2015 FINAL  Final   Organism ID, Bacteria ESCHERICHIA COLI  Final   Organism ID, Bacteria ENTEROBACTER AEROGENES  Final   Organism ID, Bacteria PROTEUS MIRABILIS  Final   Organism ID, Bacteria ENTEROCOCCUS SPECIES  Final      Susceptibility   Enterobacter aerogenes - MIC*    CEFTAZIDIME <=1 SENSITIVE Sensitive     CEFAZOLIN >=64 RESISTANT Resistant     CEFTRIAXONE <=1 SENSITIVE Sensitive     CIPROFLOXACIN <=0.25  SENSITIVE Sensitive     GENTAMICIN <=1 SENSITIVE Sensitive     IMIPENEM 1 SENSITIVE Sensitive     TRIMETH/SULFA <=20 SENSITIVE Sensitive     * MODERATE GROWTH ENTEROBACTER AEROGENES   Escherichia coli - MIC*    AMPICILLIN <=2 SENSITIVE Sensitive     CEFTAZIDIME <=1 SENSITIVE Sensitive     CEFAZOLIN <=4 SENSITIVE Sensitive     CEFTRIAXONE <=1 SENSITIVE Sensitive     CIPROFLOXACIN <=0.25 SENSITIVE Sensitive     GENTAMICIN <=1 SENSITIVE Sensitive     IMIPENEM <=0.25 SENSITIVE Sensitive     TRIMETH/SULFA <=20 SENSITIVE Sensitive     * HEAVY GROWTH ESCHERICHIA COLI   Proteus mirabilis - MIC*    AMPICILLIN >=32 RESISTANT Resistant     CEFTAZIDIME <=1 SENSITIVE Sensitive     CEFAZOLIN 8 SENSITIVE Sensitive     CEFTRIAXONE <=1 SENSITIVE Sensitive     CIPROFLOXACIN <=0.25 SENSITIVE Sensitive     GENTAMICIN <=1 SENSITIVE Sensitive     IMIPENEM 1 SENSITIVE Sensitive     TRIMETH/SULFA <=20 SENSITIVE Sensitive     * PROTEUS MIRABILIS   Enterococcus species - MIC*    AMPICILLIN >=32 RESISTANT Resistant     VANCOMYCIN >=32 RESISTANT Resistant     GENTAMICIN SYNERGY SENSITIVE Sensitive     TETRACYCLINE Value in next row Resistant      RESISTANT>=16    * HEAVY GROWTH ENTEROCOCCUS SPECIES  MRSA PCR Screening     Status: None   Collection Time: 07/31/2015  2:38 PM  Result Value Ref Range Status   MRSA by PCR NEGATIVE NEGATIVE Final    Comment:        The GeneXpert MRSA Assay (FDA approved for NASAL specimens only), is one component of a comprehensive MRSA colonization surveillance program. It is not intended to diagnose MRSA infection nor to guide or monitor treatment for MRSA infections.   Blood culture (single)     Status: None   Collection Time: 08/18/2015  3:36 PM  Result Value Ref Range Status   Specimen Description BLOOD RIGHT ASSIST CONTROL  Final   Special Requests BOTTLES DRAWN AEROBIC AND ANAEROBIC  Final   Culture NO GROWTH 5 DAYS  Final   Report Status 08/17/2015 FINAL   Final  Wound culture     Status: None   Collection Time: 08/13/15 12:37 PM  Result Value Ref Range Status   Specimen Description WOUND  Final   Special Requests Normal  Final   Gram Stain   Final    FEW WBC SEEN TOO NUMEROUS TO COUNT GRAM  NEGATIVE RODS FEW GRAM POSITIVE COCCI    Culture   Final    HEAVY GROWTH ESCHERICHIA COLI MODERATE GROWTH PROTEUS MIRABILIS LIGHT GROWTH KLEBSIELLA PNEUMONIAE MODERATE GROWTH ENTEROCOCCUS GALLINARUM CRITICAL RESULT CALLED TO, READ BACK BY AND VERIFIED WITH: [facility] BORBA AT 1042 08/16/15 DV    Report Status 08/17/2015 FINAL  Final   Organism ID, Bacteria ESCHERICHIA COLI  Final   Organism ID, Bacteria PROTEUS MIRABILIS  Final   Organism ID, Bacteria KLEBSIELLA PNEUMONIAE  Final   Organism ID, Bacteria ENTEROCOCCUS GALLINARUM  Final      Susceptibility   Escherichia coli - MIC*    AMPICILLIN >=32 RESISTANT Resistant     CEFTAZIDIME 4 RESISTANT Resistant     CEFAZOLIN >=64 RESISTANT Resistant     CEFTRIAXONE 16 RESISTANT Resistant     GENTAMICIN 2 SENSITIVE Sensitive     IMIPENEM >=16 RESISTANT Resistant     TRIMETH/SULFA <=20 SENSITIVE Sensitive     Extended ESBL POSITIVE Resistant     PIP/TAZO Value in next row Resistant      RESISTANT>=128    CIPROFLOXACIN Value in next row Sensitive      SENSITIVE<=0.25    * HEAVY GROWTH ESCHERICHIA COLI   Klebsiella pneumoniae - MIC*    AMPICILLIN Value in next row Resistant      SENSITIVE<=0.25    CEFTAZIDIME Value in next row Resistant      SENSITIVE<=0.25    CEFAZOLIN Value in next row Resistant      SENSITIVE<=0.25    CEFTRIAXONE Value in next row Resistant      SENSITIVE<=0.25    CIPROFLOXACIN Value in next row Resistant      SENSITIVE<=0.25    GENTAMICIN Value in next row Sensitive      SENSITIVE<=0.25    IMIPENEM Value in next row Resistant      SENSITIVE<=0.25    TRIMETH/SULFA Value in next row Resistant      SENSITIVE<=0.25    PIP/TAZO Value in next row Resistant      RESISTANT>=128     * LIGHT GROWTH KLEBSIELLA PNEUMONIAE   Proteus mirabilis - MIC*    AMPICILLIN Value in next row Resistant      RESISTANT>=128    CEFTAZIDIME Value in next row Sensitive      RESISTANT>=128    CEFAZOLIN Value in next row Sensitive      RESISTANT>=128    CEFTRIAXONE Value in next row Sensitive      RESISTANT>=128    CIPROFLOXACIN Value in next row Sensitive      RESISTANT>=128    GENTAMICIN Value in next row Sensitive      RESISTANT>=128    IMIPENEM Value in next row Sensitive      RESISTANT>=128    TRIMETH/SULFA Value in next row Sensitive      RESISTANT>=128    PIP/TAZO Value in next row Sensitive      SENSITIVE<=4    * MODERATE GROWTH PROTEUS MIRABILIS   Enterococcus gallinarum - MIC*    AMPICILLIN Value in next row Resistant      SENSITIVE<=4    GENTAMICIN SYNERGY Value in next row Sensitive      SENSITIVE<=4    CIPROFLOXACIN Value in next row Resistant      RESISTANT>=8    TETRACYCLINE Value in next row Resistant      RESISTANT>=16    * MODERATE GROWTH ENTEROCOCCUS GALLINARUM  Wound culture     Status: None   Collection Time: 08/15/15  2:53 PM  Result  Value Ref Range Status   Specimen Description WOUND  Final   Special Requests NONE  Final   Gram Stain FEW WBC SEEN NO ORGANISMS SEEN   Final   Culture NO GROWTH 3 DAYS  Final   Report Status 08/18/2015 FINAL  Final  C difficile quick scan w PCR reflex     Status: None   Collection Time: 08/17/15 11:34 AM  Result Value Ref Range Status   C Diff antigen NEGATIVE NEGATIVE Final   C Diff toxin NEGATIVE NEGATIVE Final   C Diff interpretation Negative for C. difficile  Final  Stool culture     Status: None   Collection Time: 09/03/15  3:51 PM  Result Value Ref Range Status   Specimen Description STOOL  Final   Special Requests Immunocompromised  Final   Culture   Final    NO SALMONELLA OR SHIGELLA ISOLATED No Pathogenic E. coli detected NO CAMPYLOBACTER DETECTED    Report Status 09/07/2015 FINAL  Final  C  difficile quick scan w PCR reflex     Status: None   Collection Time: 09/13/15 12:51 AM  Result Value Ref Range Status   C Diff antigen NEGATIVE NEGATIVE Final   C Diff toxin NEGATIVE NEGATIVE Final   C Diff interpretation Negative for C. difficile  Final  Culture, blood (routine x 2)     Status: None   Collection Time: 09/16/15 11:24 AM  Result Value Ref Range Status   Specimen Description BLOOD LEFT HAND  Final   Special Requests BOTTLES DRAWN AEROBIC AND ANAEROBIC  1CC  Final   Culture NO GROWTH 5 DAYS  Final   Report Status 09/21/2015 FINAL  Final  Culture, blood (routine x 2)     Status: None   Collection Time: 09/16/15 12:23 PM  Result Value Ref Range Status   Specimen Description BLOOD RIGHT HAND  Final   Special Requests BOTTLES DRAWN AEROBIC AND ANAEROBIC  1CC  Final   Culture  Setup Time   Final    GRAM POSITIVE COCCI AEROBIC BOTTLE ONLY CRITICAL RESULT CALLED TO, READ BACK BY AND VERIFIED WITH: TESS THOMAS,RN 09/17/2015 0631 BY JRS.    Culture   Final    ENTEROCOCCUS FAECALIS AEROBIC BOTTLE ONLY VRE HAVE INTRINSIC RESISTANCE TO MOST COMMONLY USED ANTIBIOTICS AND THE ABILITY TO ACQUIRE RESISTANCE TO MOST AVAILABLE ANTIBIOTICS. CRITICAL RESULT CALLED TO, READ BACK BY AND VERIFIED WITH: CHERYL SMITH AT 1610 09/19/15 DV    Report Status 09/21/2015 FINAL  Final   Organism ID, Bacteria ENTEROCOCCUS FAECALIS  Final      Susceptibility   Enterococcus faecalis - MIC*    AMPICILLIN <=2 SENSITIVE Sensitive     LINEZOLID 2 SENSITIVE Sensitive     CIPROFLOXACIN Value in next row Resistant      RESISTANT>=8    TETRACYCLINE Value in next row Resistant      RESISTANT>=16    VANCOMYCIN Value in next row Resistant      RESISTANT>=32    GENTAMICIN SYNERGY Value in next row Resistant      RESISTANT>=32    * ENTEROCOCCUS FAECALIS  Culture, respiratory (NON-Expectorated)     Status: None   Collection Time: 09/16/15  3:50 PM  Result Value Ref Range Status   Specimen Description  TRACHEAL ASPIRATE  Final   Special Requests Immunocompromised  Final   Gram Stain   Final    FEW WBC SEEN GOOD SPECIMEN - 80-90% WBCS RARE GRAM NEGATIVE RODS    Culture LIGHT  GROWTH PSEUDOMONAS AERUGINOSA  Final   Report Status 09/19/2015 FINAL  Final   Organism ID, Bacteria PSEUDOMONAS AERUGINOSA  Final      Susceptibility   Pseudomonas aeruginosa - MIC*    CEFTAZIDIME 8 SENSITIVE Sensitive     CIPROFLOXACIN 2 INTERMEDIATE Intermediate     GENTAMICIN >=16 RESISTANT Resistant     IMIPENEM >=16 RESISTANT Resistant     PIP/TAZO Value in next row Sensitive      SENSITIVE32    CEFEPIME Value in next row Sensitive      SENSITIVE8    LEVOFLOXACIN Value in next row Resistant      RESISTANT>=8    * LIGHT GROWTH PSEUDOMONAS AERUGINOSA  Culture, expectorated sputum-assessment     Status: None (Preliminary result)   Collection Time: 09/22/15  2:09 PM  Result Value Ref Range Status   Specimen Description ENDOTRACHEAL  Final   Special Requests Normal  Final   Sputum evaluation THIS SPECIMEN IS ACCEPTABLE FOR SPUTUM CULTURE  Final   Report Status PENDING  Incomplete  Culture, respiratory (NON-Expectorated)     Status: None (Preliminary result)   Collection Time: 09/22/15  2:09 PM  Result Value Ref Range Status   Specimen Description ENDOTRACHEAL  Final   Special Requests Normal Reflexed from [id number]  Final   Gram Stain PENDING  Incomplete   Culture HOLDING FOR POSSIBLE PATHOGEN  Final   Report Status PENDING  Incomplete    Coagulation Studies: No results for input(s): LABPROT, INR in the last 72 hours.  Urinalysis: No results for input(s): COLORURINE, LABSPEC, PHURINE, GLUCOSEU, HGBUR, BILIRUBINUR, KETONESUR, PROTEINUR, UROBILINOGEN, NITRITE, LEUKOCYTESUR in the last 72 hours.  Invalid input(s): APPERANCEUR    Imaging: No results found.   Medications:   . feeding supplement (VITAL AF 1.2 CAL) 1,000 mL (09/23/15 1800)   . acetylcysteine  3 mL Nebulization QID  . antiseptic  oral rinse  7 mL Mouth Rinse q12n4p  . chlorhexidine  15 mL Mouth Rinse BID  . DAPTOmycin (CUBICIN)  IV  500 mg Intravenous Q48H  . epoetin (EPOGEN/PROCRIT) injection  10,000 Units Intravenous Q M,W,F-HD  . feeding supplement (PRO-STAT SUGAR FREE 64)  30 mL Per Tube 6 times per day  . folic acid  1 mg Oral Daily  . free water  20 mL Per Tube 6 times per day  . heparin subcutaneous  5,000 Units Subcutaneous Q12H  . hydrocortisone  10 mg Per Tube BID  . insulin aspart  0-9 Units Subcutaneous 6 times per day  . ipratropium-albuterol  3 mL Nebulization QID  . lidocaine  1 patch Transdermal Q24H  . megestrol  400 mg Oral BID  . midodrine  5 mg Per Tube TID WC  . multivitamin  5 mL Oral Daily  . nystatin   Topical TID  . sodium chloride  10-40 mL Intracatheter Q12H  . sodium hypochlorite   Irrigation BID  . thiamine IV  100 mg Intravenous Daily   sodium chloride, sodium chloride, acetaminophen (TYLENOL) oral liquid 160 mg/5 mL, HYDROmorphone (DILAUDID) injection, morphine injection, oxyCODONE, sodium chloride  Assessment/ Plan:  49 y.o. 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 Admitted on Jan 19, 2015  1. End-stage renal disease on hemodialysis on HD MWF. The patient has been on dialysis since October of 2014. R IJ permcath.   - Plan on continuing hemodialysis on MWF schedule.  - midodine and albumin with treatment.   -   UF 1000 cc achieved with HD on Wednesday and Friday -  Next HD on Monday. HD today. UF as tolerated  2. Anemia of CKD: pt continues to require periodic blood transfusions, hemoglobin of 7.3  - epo has not been as effective as hoped secondary due to chronic inflammation from decubitus ulcer/infection   continue epogen with HD. This has been ordered as a standing order.   3. Secondary hyperparathyroidism: PTH low at 55 - phos was acceptable at 3.1 - no binders  4. ID: VRE in blood Pseudomonas in tracheal aspirate Treatment as per ID and ICU team  5. Generalized Dependent edema due to 3rd spacing - UF with dialysis , as tolerated   LOS: 43 Gianmarco Roye 11/5/20168:36 AM

## 2015-09-24 NOTE — Progress Notes (Signed)
Trach tube changed without difficulty due to blown cuff.  No adverse reaction noted

## 2015-09-24 NOTE — Progress Notes (Signed)
eLink Physician-Brief Progress Note Patient Name: Lisette GrinderHedda McMillian Monroe DOB: Aug 27, 1966 MRN: [id number]   Date of Service  09/24/2015  HPI/Events of Note  Patient is less responsive than earlier today. Patient is on TC O2 and received Dilaudid within the hour for a dressing change.   eICU Interventions  Will order an ABG now to be sure that hypercarbia is not an issue.      Intervention Category Major Interventions: Change in mental status - evaluation and management  Jayvan Mcshan Eugene 09/24/2015, 4:21 PM

## 2015-09-25 ENCOUNTER — Inpatient Hospital Stay: Payer: 59

## 2015-09-25 LAB — CBC WITH DIFFERENTIAL/PLATELET
BASOS ABS: 0 10*3/uL (ref 0–0.1)
BASOS PCT: 0 %
EOS PCT: 0 %
Eosinophils Absolute: 0 10*3/uL (ref 0–0.7)
HEMATOCRIT: 23.4 % — AB (ref 35.0–47.0)
Hemoglobin: 7.1 g/dL — ABNORMAL LOW (ref 12.0–16.0)
Lymphocytes Relative: 5 %
Lymphs Abs: 0.6 10*3/uL — ABNORMAL LOW (ref 1.0–3.6)
MCH: 31.6 pg (ref 26.0–34.0)
MCHC: 30.3 g/dL — AB (ref 32.0–36.0)
MCV: 104.4 fL — ABNORMAL HIGH (ref 80.0–100.0)
MONO ABS: 0.9 10*3/uL (ref 0.2–0.9)
MONOS PCT: 7 %
NEUTROS ABS: 11.5 10*3/uL — AB (ref 1.4–6.5)
Neutrophils Relative %: 88 %
PLATELETS: 133 10*3/uL — AB (ref 150–440)
RBC: 2.25 MIL/uL — ABNORMAL LOW (ref 3.80–5.20)
RDW: 20.8 % — AB (ref 11.5–14.5)
WBC: 13 10*3/uL — ABNORMAL HIGH (ref 3.6–11.0)

## 2015-09-25 LAB — GLUCOSE, CAPILLARY
GLUCOSE-CAPILLARY: 101 mg/dL — AB (ref 65–99)
GLUCOSE-CAPILLARY: 114 mg/dL — AB (ref 65–99)
GLUCOSE-CAPILLARY: 138 mg/dL — AB (ref 65–99)
GLUCOSE-CAPILLARY: 161 mg/dL — AB (ref 65–99)
Glucose-Capillary: 142 mg/dL — ABNORMAL HIGH (ref 65–99)

## 2015-09-25 MED ORDER — STERILE WATER FOR INJECTION IJ SOLN
INTRAMUSCULAR | Status: AC
  Administered 2015-09-25: 10 mL
  Filled 2015-09-25: qty 10

## 2015-09-25 MED ORDER — ALTEPLASE 2 MG IJ SOLR
2.0000 mg | Freq: Once | INTRAMUSCULAR | Status: AC
  Administered 2015-09-25: 2 mg
  Filled 2015-09-25: qty 2

## 2015-09-25 NOTE — Progress Notes (Signed)
Patient's dobhoff occluded. Clogged dobhoff removed and 12 Fr dobhoff placed and x-ray obtained and shows tube is in the antral stomach.

## 2015-09-25 NOTE — Progress Notes (Signed)
ANTIBIOTIC CONSULT NOTE - Follow Up  Pharmacy Consult for daptomycin  Indication: Bacteremia   Allergies  Allergen Reactions  . Contrast Media [Iodinated Diagnostic Agents] Anaphylaxis  . Ampicillin Rash    Patient Measurements: Height:  (175.3 cm) Weight: 209 lb 7 oz (95 kg) IBW/kg (Calculated) : 66.2  Vital Signs: BP: 124/63 mmHg (11/06 0600) Pulse Rate: 121 (11/06 0500) Intake/Output from previous day: 11/05 0701 - 11/06 0700 In: 820 [I.V.:30; NG/GT:680; IV Piggyback:110] Out: -  Intake/Output from this shift:    Labs:  Recent Labs  09/23/15 0503  WBC 13.6*  HGB 7.3*  PLT 199  CREATININE 1.19*   Estimated Creatinine Clearance: 69.4 mL/min (by C-G formula based on Cr of 1.19). No results for input(s): VANCOTROUGH, VANCOPEAK, VANCORANDOM, GENTTROUGH, GENTPEAK, GENTRANDOM, TOBRATROUGH, TOBRAPEAK, TOBRARND, AMIKACINPEAK, AMIKACINTROU, AMIKACIN in the last 72 hours.   Microbiology: Recent Results (from the past 720 hour(s))  Stool culture     Status: None   Collection Time: 09/03/15  3:51 PM  Result Value Ref Range Status   Specimen Description STOOL  Final   Special Requests Immunocompromised  Final   Culture   Final    NO SALMONELLA OR SHIGELLA ISOLATED No Pathogenic E. coli detected NO CAMPYLOBACTER DETECTED    Report Status 09/07/2015 FINAL  Final  C difficile quick scan w PCR reflex     Status: None   Collection Time: 09/13/15 12:51 AM  Result Value Ref Range Status   C Diff antigen NEGATIVE NEGATIVE Final   C Diff toxin NEGATIVE NEGATIVE Final   C Diff interpretation Negative for C. difficile  Final  Culture, blood (routine x 2)     Status: None   Collection Time: 09/16/15 11:24 AM  Result Value Ref Range Status   Specimen Description BLOOD LEFT HAND  Final   Special Requests BOTTLES DRAWN AEROBIC AND ANAEROBIC  1CC  Final   Culture NO GROWTH 5 DAYS  Final   Report Status 09/21/2015 FINAL  Final  Culture, blood (routine x 2)     Status: None    Collection Time: 09/16/15 12:23 PM  Result Value Ref Range Status   Specimen Description BLOOD RIGHT HAND  Final   Special Requests BOTTLES DRAWN AEROBIC AND ANAEROBIC  1CC  Final   Culture  Setup Time   Final    GRAM POSITIVE COCCI AEROBIC BOTTLE ONLY CRITICAL RESULT CALLED TO, READ BACK BY AND VERIFIED WITH: TESS THOMAS,RN 09/17/2015 0631 BY JRS.    Culture   Final    ENTEROCOCCUS FAECALIS AEROBIC BOTTLE ONLY VRE HAVE INTRINSIC RESISTANCE TO MOST COMMONLY USED ANTIBIOTICS AND THE ABILITY TO ACQUIRE RESISTANCE TO MOST AVAILABLE ANTIBIOTICS. CRITICAL RESULT CALLED TO, READ BACK BY AND VERIFIED WITH: CHERYL SMITH AT 1610 09/19/15 DV    Report Status 09/21/2015 FINAL  Final   Organism ID, Bacteria ENTEROCOCCUS FAECALIS  Final      Susceptibility   Enterococcus faecalis - MIC*    AMPICILLIN <=2 SENSITIVE Sensitive     LINEZOLID 2 SENSITIVE Sensitive     CIPROFLOXACIN Value in next row Resistant      RESISTANT>=8    TETRACYCLINE Value in next row Resistant      RESISTANT>=16    VANCOMYCIN Value in next row Resistant      RESISTANT>=32    GENTAMICIN SYNERGY Value in next row Resistant      RESISTANT>=32    * ENTEROCOCCUS FAECALIS  Culture, respiratory (NON-Expectorated)     Status: None   Collection  Time: 09/16/15  3:50 PM  Result Value Ref Range Status   Specimen Description TRACHEAL ASPIRATE  Final   Special Requests Immunocompromised  Final   Gram Stain   Final    FEW WBC SEEN GOOD SPECIMEN - 80-90% WBCS RARE GRAM NEGATIVE RODS    Culture LIGHT GROWTH PSEUDOMONAS AERUGINOSA  Final   Report Status 09/19/2015 FINAL  Final   Organism ID, Bacteria PSEUDOMONAS AERUGINOSA  Final      Susceptibility   Pseudomonas aeruginosa - MIC*    CEFTAZIDIME 8 SENSITIVE Sensitive     CIPROFLOXACIN 2 INTERMEDIATE Intermediate     GENTAMICIN >=16 RESISTANT Resistant     IMIPENEM >=16 RESISTANT Resistant     PIP/TAZO Value in next row Sensitive      SENSITIVE32    CEFEPIME Value in next  row Sensitive      SENSITIVE8    LEVOFLOXACIN Value in next row Resistant      RESISTANT>=8    * LIGHT GROWTH PSEUDOMONAS AERUGINOSA  Culture, expectorated sputum-assessment     Status: None   Collection Time: 09/22/15  2:09 PM  Result Value Ref Range Status   Specimen Description ENDOTRACHEAL  Final   Special Requests Normal  Final   Sputum evaluation THIS SPECIMEN IS ACCEPTABLE FOR SPUTUM CULTURE  Final   Report Status 09/24/2015 FINAL  Final  Culture, respiratory (NON-Expectorated)     Status: None (Preliminary result)   Collection Time: 09/22/15  2:09 PM  Result Value Ref Range Status   Specimen Description ENDOTRACHEAL  Final   Special Requests Normal Reflexed from [id number]  Final   Gram Stain   Final    FEW WBC SEEN FEW GRAM NEGATIVE RODS POOR SPECIMEN - LESS THAN 70% WBCS    Culture   Final    MODERATE GROWTH GRAM NEGATIVE RODS IDENTIFICATION AND SUSCEPTIBILITIES TO FOLLOW    Report Status PENDING  Incomplete    Medical History: Past Medical History  Diagnosis Date  . Obesity   . Dyslipidemia   . Hypertension   . Coronary artery disease     s/p BMS 2010 LAD  . Dysrhythmia     ventricular tachycardia resolved after LAD stent and beta blocker  . Diabetes mellitus     with retinopathy, neuropathy and microalbuminemia  . ESRD (end stage renal disease) on dialysis     Assessment: 49 yo female admitted on 07/21/2015 with urosepsis and  infected sacral decubitus ulcer. Currently patient is being treated for 1 of 2 positive blood cultures growing VRE from 10/28. Dr. [name] requested daptomycin be initiated on patient.  She is an MWF HD patient. Daptomycin 6mg /kg every 48 hours was initiated.    Plan:  Will continue treatment with daptomycin 500mg  every 48 hours. Pharmacy will continue to monitor labs and renal function and make adjustments as needed. Recommend checking for increase in CPK levels at least once a week.   Garlon HatchetJody Jaymen Fetch, PharmD Clinical Pharmacist   09/25/2015 7:54 AM

## 2015-09-25 NOTE — Progress Notes (Signed)
[facility] Progess Note  Name: Courtney GrinderHedda McMillian [place] MRN: [id number] DOB: [birth year]    ADMISSION DATE:  07/21/2015     INITIAL PRESENTATION:  49 AAF who has been in medical facilities (hosp, LTAC, rehab) for 2 yrs following gastric bypass surgery with multiple complications. Now with chronic trach, ESRD, profound debilitation, severe sacral pressure ulcer. Was seemingly making progress and transferred to rehab facility approx one week prior to this admission. She was sent to [facility] ED with AMS and hypotension. Working dx of severe sepsis/septic shock due to infected sacral pressure ulcer. Since admission. Her course is been very complicated with numerous complications including septic shock and GI bleeding. Currently, she has an NG tube in place, she takes in minimal by mouth nutrition, though she appears to be able to do this, however, she is refusing to eat. Patient with recurrent sepsis  INTERVAL HISTORY: The patient completed her oral feeding trial with calorie count. She was subsequently placed back on tube feeds, however, the case was discussed with the critical care RN and she continues to routinely refuses oral food intake. The patient is otherwise awake and alert, she appears in no acute distress. We have continued nebulizer treatments with Mucomyst and DuoNeb nebs every 4 with frequent suctioning, per respiratory therapist and critical care RN. The patient continues to have some thick secretions that appeared to be normal in color. Placed on vasopressors and placed back on vent Trach changed out due to cuff leak  The patient was started on Megace and thiamine, on 09/22/2015 per the husband's request.  MAJOR EVENTS/TEST RESULTS: Admission 02/07/14-05/07/14 Admission 07/21/14-09/06/14 Discharged to [facility]. Pt had palliative consult at that time, were asked to sign off by husband.  09/23 CT head: NAD 09/23 EEG: no epileptiform activity 09/23 PRBCs for Hgb  6.4 09/24 bedside debridement of sacral wound. Abscess drained 09/25 Off vasopressors. More alert. No distress. Worsening thrombocytopenia. Vanc DC'd 09/29  Dr. [name] (I.D) excused from the case by patient's husband. 10/02 MRI -multiple infarcts 10/03 tracheal bleeding- transfused platelets 10/03 hospitalist service excused from the case by patient's husband 10/03 Echocardiogram ejection fraction was 55-60%, pulmonary systolic pressure was 39 mmHg 40/9810/08 restart TF's at lower rate, attempt reg HD  10/12 Transferred to med-surg floor. Remains on PCCM service 10/14 SLP eval: pt unable to tolerate PMV adequately 10/17-will re-attempt PM valve-discussed with Speech therapist 10/18 passed swallow eval-start pureed thick foods no thin liquids-continue NG feeds 10/19 transferred to step down for sepsis/aspiration pneumonia 10/19 cxr shows RLL opacity 10/21 sacral decub debride at bedside by surgery 10/22 started back on vasopressors while on HD 10/27 CT with osteo, R hip fx, unable to identify tip of dubhoff tube - sent for fluoro study 10/28 Ortho consultation: I do not feel that she is a surgical candidate. Therefore, I feel that it would best to manage this fracture nonsurgically and allow it to heal by itself over time, which it should.  10/28 Gen Surg consultation: Due to the lack of free air or free flow of contrast and the peritoneum there is no indication for any surgical intervention on this. Would recommend pulling back the feeding tube 1-2 cm. Would recheck an abdominal film to confirm no pneumoperitoneum in the morning. Absence of any changes okay to continue using Dobbhoff for feeding and medications. 10/28 gastrograffin study: The study confirms that the feeding tube pes perforated through the duodenum and the tip is within a cavity that fills with injected contrast. 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 Pneumoniae, P. Mirabilis, VRE Wound 9/26 >> No growth Blood 9/23 >> NEG CDiff 9/27>>neg Stool Cx 10/15>> negative Cdiff 10/25>>neg Trach Aspirate 10/28>> light growth pseudomonas Blood 10/28 >> 1/2 GPC >>  Sputum cultures obtained 09/22/15 due to mucus plugging, results are pending.  BONE TISSUE CULTURE PENDING  ANTIMICROBIALS:  Aztreonam 9/23 >> 9/24 Vanc 9/23 >> 9/25 Vanc 9/26>>9/27 Daptomycin 9/27>> 10/12, 10/28>>  Meropenem 9/23 >> 10/14, 10/28 >> 10/31   VITAL SIGNS: Temp:  [98 F (36.7 C)-98.6 F (37 C)] 98.6 F (37 C) (11/06 0800) Pulse Rate:  [109-123] 121 (11/06 0500) Resp:  [0-31] 16 (11/06 0600) BP: (54-140)/(13-81) 124/63 mmHg (11/06 0600) SpO2:  [94 %-100 %] 100 % (11/06 0500) FiO2 (%):  [35 %-100 %] 35 % (11/06 0437) Weight:  [209 lb 7 oz (95 kg)] 209 lb 7 oz (95 kg) (11/06 0500) HEMODYNAMICS:   VENTILATOR SETTINGS: Vent Mode:  [-] PRVC FiO2 (%):  [35 %-100 %] 35 % Set Rate:  [14 bmp] 14 bmp Vt Set:  [500 mL] 500 mL PEEP:  [5 cmH20] 5 cmH20 Plateau Pressure:  [21 cmH20] 21 cmH20 INTAKE / OUTPUT:  Intake/Output Summary (Last 24 hours) at 09/25/15 0839 Last data filed at 09/24/15 2132  Gross per 24 hour  Intake    820 ml  Output      0 ml  Net    820 ml    PHYSICAL EXAMINATION: Physical Examination:   VS: BP 124/63 mmHg  Pulse 121  Temp(Src) 98.6 F (37 C) (Axillary)  Resp 16  Ht  (1.753 m)  Wt 209 lb 7 oz (95 kg)  BMI 30.91 kg/m2  SpO2  100%  LMP  (LMP Unknown)  General Appearance: No resp distress, coarse upper airway sounds  Neuro: profoundly diffusely weak HEENT: cushingoid facies, PERRLA, EOM intact Neck: trach site clean Pulmonary: clear anteriorly Cardiovascular: reg, + syst murmur Abdomen: soft, NT, +BS Extremities: warm, R>L UE edema Examination of the sacral decubitus ulcer- showed a very deep ulcer approximately 7-8 cm across.   stage IV ulcer.   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 BMET intermittently Monitor I/Os Correct electrolytes as indicated Intermittent HD per Renal Service (MWF) -plan to wean off vent as tolerated, wean off vasopressors as tolerated  GASTROINTESTINAL A:  LGIB, resolved Hx  of C. Diff Stool incontinence  P:  Cont PPI Resume TFs 10/30 goal now at 50 cc/hr -will not be able to take oral feeds as she is on the vent  HEMATOLOGIC A:  Acute on chronic anemia S/p Thrombocytopenia - HIT panel negative 9/25, resolved P: DVT px: SCDs + SQ heparin Monitor CBC intermittently Transfuse per usual ICU guidelines  INFECTIOUS A:  Recurrent Severe sepsis, due to sacral decub, currently stable.  Sacral decubitus ulcer with abscess  P:  Monitor temp, WBC count Micro and abx as above  ENDOCRINE A:  -DM 2, controlled. The patient developed some hypoglycemia when the tube feed was stopped and  therefore she was started on D10 because she did not take in an adequate amount of calories to maintain her blood glucose levels in the normal range.  -Chronic steroid therapy, for a history of of adrenal insufficiency. P:  Monitor CBGs q 8 hrs - resume SSI if > 180 Cont hydrocortisone @ 10 mg BID Continue  tube feeds. We'll need to monitor blood glucose levels.  MSK A: Right hip fx - small intertrochanteric R fx - pain management. Ortho evaluated and felt not a surgical candidate at this time.   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    I have personally obtained a history, examined the patient, evaluated Pertinent laboratory and [employer]/imaging results, and  formulated the assessment and plan   The Patient requires high complexity decision making for assessment and support, frequent evaluation and titration of therapies, application of advanced monitoring technologies and extensive interpretation of multiple databases. Critical Care Time devoted to patient care services described in this note is 45 minutes.   Overall, patient is critically ill, prognosis is guarded.  Patient with Multiorgan failure and at high risk for cardiac arrest and death. prognosis is very poor, patient with very poor chance of meaningful recovery. Patient will prob not walk again, patient with extensive osteomyelitis of the sacrum/coccyx/T8-T9 spine, will need PEG tube for long term   I recommend Hospice and comfort care measures-patient with evidence of pain and suffering.   Lucie Leather, M.D.  [facility]  Medical Director [facility] Medical Director [facility] Cardio-Pulmonary Department

## 2015-09-25 NOTE — Progress Notes (Signed)
Subjective:   Patient remains critically ill Eyes closed Low dose of norepinephrine today No acute events Last dialysis was Friday.  Back on ventilator     Objective:  Vital signs in last 24 hours:  Temp:  [98 F (36.7 C)-98.6 F (37 C)] 98.6 F (37 C) (11/06 0800) Pulse Rate:  [109-126] 125 (11/06 0900) Resp:  [0-31] 18 (11/06 0900) BP: (54-140)/(13-81) 123/60 mmHg (11/06 0900) SpO2:  [94 %-100 %] 100 % (11/06 0900) FiO2 (%):  [35 %-100 %] 35 % (11/06 0845) Weight:  [95 kg (209 lb 7 oz)] 95 kg (209 lb 7 oz) (11/06 0500)  Weight change: 0 kg (0 lb) Filed Weights   09/23/15 0930 09/24/15 0500 09/25/15 0500  Weight: 95 kg (209 lb 7 oz) 91 kg (200 lb 9.9 oz) 95 kg (209 lb 7 oz)    Intake/Output: I/O last 3 completed shifts: In: 1460 [I.V.:30; NG/GT:1320; IV Piggyback:110] Out: -    Intake/Output this shift:     Physical Exam: General: No acute distress, critically ill  Head: NG in place  Eyes: anicteric  Neck: Trach in place  Lungs:  Scattered rhonchi, vent assisted  Heart: S1S2 +tachycardia  Abdomen:  Soft, nontender, BS present  Extremities: ++ dependent edema, anasarca  Neurologic:  did not wake up this morning   Access:  R IJ permcath.       Basic Metabolic Panel:  Recent Labs Lab 09/20/15 0436 09/21/15 0459 09/23/15 0503  NA 139 141 139  K 3.6 3.7 3.7  CL 101 102 102  CO2 32 32 33*  GLUCOSE 164* 69 222*  BUN 37* 46* 35*  CREATININE 1.02* 1.26* 1.19*  CALCIUM 7.7* 8.1* 7.8*    Liver Function Tests: No results for input(s): AST, ALT, ALKPHOS, BILITOT, PROT, ALBUMIN in the last 168 hours. No results for input(s): LIPASE, AMYLASE in the last 168 hours. No results for input(s): AMMONIA in the last 168 hours.  CBC:  Recent Labs Lab 09/20/15 0436 09/21/15 0459 09/23/15 0503  WBC 9.5 11.7* 13.6*  HGB 7.2* 7.6* 7.3*  HCT 23.4* 24.7* 23.6*  MCV 103.8* 104.1* 105.1*  PLT 194 207 199    Cardiac Enzymes:  Recent Labs Lab 09/21/15 0459   CKTOTAL 10*    BNP: Invalid input(s): POCBNP  CBG:  Recent Labs Lab 09/24/15 1611 09/24/15 2001 09/24/15 2345 09/25/15 0352 09/25/15 0806  GLUCAP 171* 164* 114* 101* 114*    Microbiology: Results for orders placed or performed during the hospital encounter of 08-24-2015  Blood Culture (routine x 2)     Status: None   Collection Time: 2015-08-24  8:51 AM  Result Value Ref Range Status   Specimen Description BLOOD [name]  Final   Special Requests BOTTLES DRAWN AEROBIC AND ANAEROBIC  3CC  Final   Culture NO GROWTH 5 DAYS  Final   Report Status 08/17/2015 FINAL  Final  Blood Culture (routine x 2)     Status: None   Collection Time: August 24, 2015  9:20 AM  Result Value Ref Range Status   Specimen Description BLOOD LEFT ARM  Final   Special Requests BOTTLES DRAWN AEROBIC AND ANAEROBIC  1CC  Final   Culture NO GROWTH 5 DAYS  Final   Report Status 08/17/2015 FINAL  Final  Wound culture     Status: None   Collection Time: August 24, 2015  9:20 AM  Result Value Ref Range Status   Specimen Description DECUBITIS  Final   Special Requests Normal  Final   Gram  Stain   Final    FEW WBC SEEN MANY GRAM NEGATIVE RODS RARE GRAM POSITIVE COCCI    Culture   Final    HEAVY GROWTH ESCHERICHIA COLI MODERATE GROWTH ENTEROBACTER AEROGENES PROTEUS MIRABILIS HEAVY GROWTH ENTEROCOCCUS SPECIES VRE HAVE INTRINSIC RESISTANCE TO MOST COMMONLY USED ANTIBIOTICS AND THE ABILITY TO ACQUIRE RESISTANCE TO MOST AVAILABLE ANTIBIOTICS.    Report Status 08/16/2015 FINAL  Final   Organism ID, Bacteria ESCHERICHIA COLI  Final   Organism ID, Bacteria ENTEROBACTER AEROGENES  Final   Organism ID, Bacteria PROTEUS MIRABILIS  Final   Organism ID, Bacteria ENTEROCOCCUS SPECIES  Final      Susceptibility   Enterobacter aerogenes - MIC*    CEFTAZIDIME <=1 SENSITIVE Sensitive     CEFAZOLIN >=64 RESISTANT Resistant     CEFTRIAXONE <=1 SENSITIVE Sensitive     CIPROFLOXACIN <=0.25 SENSITIVE Sensitive     GENTAMICIN <=1  SENSITIVE Sensitive     IMIPENEM 1 SENSITIVE Sensitive     TRIMETH/SULFA <=20 SENSITIVE Sensitive     * MODERATE GROWTH ENTEROBACTER AEROGENES   Escherichia coli - MIC*    AMPICILLIN <=2 SENSITIVE Sensitive     CEFTAZIDIME <=1 SENSITIVE Sensitive     CEFAZOLIN <=4 SENSITIVE Sensitive     CEFTRIAXONE <=1 SENSITIVE Sensitive     CIPROFLOXACIN <=0.25 SENSITIVE Sensitive     GENTAMICIN <=1 SENSITIVE Sensitive     IMIPENEM <=0.25 SENSITIVE Sensitive     TRIMETH/SULFA <=20 SENSITIVE Sensitive     * HEAVY GROWTH ESCHERICHIA COLI   Proteus mirabilis - MIC*    AMPICILLIN >=32 RESISTANT Resistant     CEFTAZIDIME <=1 SENSITIVE Sensitive     CEFAZOLIN 8 SENSITIVE Sensitive     CEFTRIAXONE <=1 SENSITIVE Sensitive     CIPROFLOXACIN <=0.25 SENSITIVE Sensitive     GENTAMICIN <=1 SENSITIVE Sensitive     IMIPENEM 1 SENSITIVE Sensitive     TRIMETH/SULFA <=20 SENSITIVE Sensitive     * PROTEUS MIRABILIS   Enterococcus species - MIC*    AMPICILLIN >=32 RESISTANT Resistant     VANCOMYCIN >=32 RESISTANT Resistant     GENTAMICIN SYNERGY SENSITIVE Sensitive     TETRACYCLINE Value in next row Resistant      RESISTANT>=16    * HEAVY GROWTH ENTEROCOCCUS SPECIES  MRSA PCR Screening     Status: None   Collection Time: 10-26-15  2:38 PM  Result Value Ref Range Status   MRSA by PCR NEGATIVE NEGATIVE Final    Comment:        The GeneXpert MRSA Assay (FDA approved for NASAL specimens only), is one component of a comprehensive MRSA colonization surveillance program. It is not intended to diagnose MRSA infection nor to guide or monitor treatment for MRSA infections.   Blood culture (single)     Status: None   Collection Time: 10-26-15  3:36 PM  Result Value Ref Range Status   Specimen Description BLOOD RIGHT ASSIST CONTROL  Final   Special Requests BOTTLES DRAWN AEROBIC AND ANAEROBIC 5ML  Final   Culture NO GROWTH 5 DAYS  Final   Report Status 08/17/2015 FINAL  Final  Wound culture     Status: None    Collection Time: 08/13/15 12:37 PM  Result Value Ref Range Status   Specimen Description WOUND  Final   Special Requests Normal  Final   Gram Stain   Final    FEW WBC SEEN TOO NUMEROUS TO COUNT GRAM NEGATIVE RODS FEW GRAM POSITIVE COCCI    Culture  Final    HEAVY GROWTH ESCHERICHIA COLI MODERATE GROWTH PROTEUS MIRABILIS LIGHT GROWTH KLEBSIELLA PNEUMONIAE MODERATE GROWTH ENTEROCOCCUS GALLINARUM CRITICAL RESULT CALLED TO, READ BACK BY AND VERIFIED WITH: [facility] BORBA AT 1042 08/16/15 DV    Report Status 08/17/2015 FINAL  Final   Organism ID, Bacteria ESCHERICHIA COLI  Final   Organism ID, Bacteria PROTEUS MIRABILIS  Final   Organism ID, Bacteria KLEBSIELLA PNEUMONIAE  Final   Organism ID, Bacteria ENTEROCOCCUS GALLINARUM  Final      Susceptibility   Escherichia coli - MIC*    AMPICILLIN >=32 RESISTANT Resistant     CEFTAZIDIME 4 RESISTANT Resistant     CEFAZOLIN >=64 RESISTANT Resistant     CEFTRIAXONE 16 RESISTANT Resistant     GENTAMICIN 2 SENSITIVE Sensitive     IMIPENEM >=16 RESISTANT Resistant     TRIMETH/SULFA <=20 SENSITIVE Sensitive     Extended ESBL POSITIVE Resistant     PIP/TAZO Value in next row Resistant      RESISTANT>=128    CIPROFLOXACIN Value in next row Sensitive      SENSITIVE<=0.25    * HEAVY GROWTH ESCHERICHIA COLI   Klebsiella pneumoniae - MIC*    AMPICILLIN Value in next row Resistant      SENSITIVE<=0.25    CEFTAZIDIME Value in next row Resistant      SENSITIVE<=0.25    CEFAZOLIN Value in next row Resistant      SENSITIVE<=0.25    CEFTRIAXONE Value in next row Resistant      SENSITIVE<=0.25    CIPROFLOXACIN Value in next row Resistant      SENSITIVE<=0.25    GENTAMICIN Value in next row Sensitive      SENSITIVE<=0.25    IMIPENEM Value in next row Resistant      SENSITIVE<=0.25    TRIMETH/SULFA Value in next row Resistant      SENSITIVE<=0.25    PIP/TAZO Value in next row Resistant      RESISTANT>=128    * LIGHT GROWTH KLEBSIELLA  PNEUMONIAE   Proteus mirabilis - MIC*    AMPICILLIN Value in next row Resistant      RESISTANT>=128    CEFTAZIDIME Value in next row Sensitive      RESISTANT>=128    CEFAZOLIN Value in next row Sensitive      RESISTANT>=128    CEFTRIAXONE Value in next row Sensitive      RESISTANT>=128    CIPROFLOXACIN Value in next row Sensitive      RESISTANT>=128    GENTAMICIN Value in next row Sensitive      RESISTANT>=128    IMIPENEM Value in next row Sensitive      RESISTANT>=128    TRIMETH/SULFA Value in next row Sensitive      RESISTANT>=128    PIP/TAZO Value in next row Sensitive      SENSITIVE<=4    * MODERATE GROWTH PROTEUS MIRABILIS   Enterococcus gallinarum - MIC*    AMPICILLIN Value in next row Resistant      SENSITIVE<=4    GENTAMICIN SYNERGY Value in next row Sensitive      SENSITIVE<=4    CIPROFLOXACIN Value in next row Resistant      RESISTANT>=8    TETRACYCLINE Value in next row Resistant      RESISTANT>=16    * MODERATE GROWTH ENTEROCOCCUS GALLINARUM  Wound culture     Status: None   Collection Time: 08/15/15  2:53 PM  Result Value Ref Range Status   Specimen Description WOUND  Final  Special Requests NONE  Final   Gram Stain FEW WBC SEEN NO ORGANISMS SEEN   Final   Culture NO GROWTH 3 DAYS  Final   Report Status 08/18/2015 FINAL  Final  C difficile quick scan w PCR reflex     Status: None   Collection Time: 08/17/15 11:34 AM  Result Value Ref Range Status   C Diff antigen NEGATIVE NEGATIVE Final   C Diff toxin NEGATIVE NEGATIVE Final   C Diff interpretation Negative for C. difficile  Final  Stool culture     Status: None   Collection Time: 09/03/15  3:51 PM  Result Value Ref Range Status   Specimen Description STOOL  Final   Special Requests Immunocompromised  Final   Culture   Final    NO SALMONELLA OR SHIGELLA ISOLATED No Pathogenic E. coli detected NO CAMPYLOBACTER DETECTED    Report Status 09/07/2015 FINAL  Final  C difficile quick scan w PCR reflex      Status: None   Collection Time: 09/13/15 12:51 AM  Result Value Ref Range Status   C Diff antigen NEGATIVE NEGATIVE Final   C Diff toxin NEGATIVE NEGATIVE Final   C Diff interpretation Negative for C. difficile  Final  Culture, blood (routine x 2)     Status: None   Collection Time: 09/16/15 11:24 AM  Result Value Ref Range Status   Specimen Description BLOOD LEFT HAND  Final   Special Requests BOTTLES DRAWN AEROBIC AND ANAEROBIC  1CC  Final   Culture NO GROWTH 5 DAYS  Final   Report Status 09/21/2015 FINAL  Final  Culture, blood (routine x 2)     Status: None   Collection Time: 09/16/15 12:23 PM  Result Value Ref Range Status   Specimen Description BLOOD RIGHT HAND  Final   Special Requests BOTTLES DRAWN AEROBIC AND ANAEROBIC  1CC  Final   Culture  Setup Time   Final    GRAM POSITIVE COCCI AEROBIC BOTTLE ONLY CRITICAL RESULT CALLED TO, READ BACK BY AND VERIFIED WITH: TESS THOMAS,RN 09/17/2015 0631 BY JRS.    Culture   Final    ENTEROCOCCUS FAECALIS AEROBIC BOTTLE ONLY VRE HAVE INTRINSIC RESISTANCE TO MOST COMMONLY USED ANTIBIOTICS AND THE ABILITY TO ACQUIRE RESISTANCE TO MOST AVAILABLE ANTIBIOTICS. CRITICAL RESULT CALLED TO, READ BACK BY AND VERIFIED WITH: CHERYL SMITH AT 5366 09/19/15 DV    Report Status 09/21/2015 FINAL  Final   Organism ID, Bacteria ENTEROCOCCUS FAECALIS  Final      Susceptibility   Enterococcus faecalis - MIC*    AMPICILLIN <=2 SENSITIVE Sensitive     LINEZOLID 2 SENSITIVE Sensitive     CIPROFLOXACIN Value in next row Resistant      RESISTANT>=8    TETRACYCLINE Value in next row Resistant      RESISTANT>=16    VANCOMYCIN Value in next row Resistant      RESISTANT>=32    GENTAMICIN SYNERGY Value in next row Resistant      RESISTANT>=32    * ENTEROCOCCUS FAECALIS  Culture, respiratory (NON-Expectorated)     Status: None   Collection Time: 09/16/15  3:50 PM  Result Value Ref Range Status   Specimen Description TRACHEAL ASPIRATE  Final   Special  Requests Immunocompromised  Final   Gram Stain   Final    FEW WBC SEEN GOOD SPECIMEN - 80-90% WBCS RARE GRAM NEGATIVE RODS    Culture LIGHT GROWTH PSEUDOMONAS AERUGINOSA  Final   Report Status 09/19/2015 FINAL  Final  Organism ID, Bacteria PSEUDOMONAS AERUGINOSA  Final      Susceptibility   Pseudomonas aeruginosa - MIC*    CEFTAZIDIME 8 SENSITIVE Sensitive     CIPROFLOXACIN 2 INTERMEDIATE Intermediate     GENTAMICIN >=16 RESISTANT Resistant     IMIPENEM >=16 RESISTANT Resistant     PIP/TAZO Value in next row Sensitive      SENSITIVE32    CEFEPIME Value in next row Sensitive      SENSITIVE8    LEVOFLOXACIN Value in next row Resistant      RESISTANT>=8    * LIGHT GROWTH PSEUDOMONAS AERUGINOSA  Culture, expectorated sputum-assessment     Status: None   Collection Time: 09/22/15  2:09 PM  Result Value Ref Range Status   Specimen Description ENDOTRACHEAL  Final   Special Requests Normal  Final   Sputum evaluation THIS SPECIMEN IS ACCEPTABLE FOR SPUTUM CULTURE  Final   Report Status 09/24/2015 FINAL  Final  Culture, respiratory (NON-Expectorated)     Status: None (Preliminary result)   Collection Time: 09/22/15  2:09 PM  Result Value Ref Range Status   Specimen Description ENDOTRACHEAL  Final   Special Requests Normal Reflexed from [id number]  Final   Gram Stain   Final    FEW WBC SEEN FEW GRAM NEGATIVE RODS POOR SPECIMEN - LESS THAN 70% WBCS    Culture   Final    MODERATE GROWTH GRAM NEGATIVE RODS IDENTIFICATION AND SUSCEPTIBILITIES TO FOLLOW    Report Status PENDING  Incomplete    Coagulation Studies: No results for input(s): LABPROT, INR in the last 72 hours.  Urinalysis: No results for input(s): COLORURINE, LABSPEC, PHURINE, GLUCOSEU, HGBUR, BILIRUBINUR, KETONESUR, PROTEINUR, UROBILINOGEN, NITRITE, LEUKOCYTESUR in the last 72 hours.  Invalid input(s): APPERANCEUR    Imaging: No results found.   Medications:   . feeding supplement (VITAL AF 1.2 CAL) 1,000 mL  (09/25/15 0424)  . norepinephrine 2 mcg/min (09/25/15 5784)   . acetylcysteine  3 mL Nebulization QID  . antiseptic oral rinse  7 mL Mouth Rinse q12n4p  . chlorhexidine  15 mL Mouth Rinse BID  . DAPTOmycin (CUBICIN)  IV  500 mg Intravenous Q48H  . epoetin (EPOGEN/PROCRIT) injection  10,000 Units Intravenous Q M,W,F-HD  . feeding supplement (PRO-STAT SUGAR FREE 64)  30 mL Per Tube 6 times per day  . folic acid  1 mg Oral Daily  . free water  20 mL Per Tube 6 times per day  . heparin subcutaneous  5,000 Units Subcutaneous Q12H  . hydrocortisone  10 mg Per Tube BID  . insulin aspart  0-9 Units Subcutaneous 6 times per day  . ipratropium-albuterol  3 mL Nebulization QID  . lidocaine  1 patch Transdermal Q24H  . megestrol  400 mg Oral BID  . midodrine  5 mg Per Tube TID WC  . multivitamin  5 mL Oral Daily  . nystatin   Topical TID  . sodium chloride  10-40 mL Intracatheter Q12H  . sodium hypochlorite   Irrigation BID  . thiamine IV  100 mg Intravenous Daily   sodium chloride, sodium chloride, acetaminophen (TYLENOL) oral liquid 160 mg/5 mL, HYDROmorphone (DILAUDID) injection, morphine injection, oxyCODONE, sodium chloride  Assessment/ Plan:  49 y.o. 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 Admitted on 09-02-2015  1. End-stage renal disease on hemodialysis on HD MWF. The patient has been on dialysis since October of  2014. R IJ permcath.   - Plan on continuing hemodialysis on MWF schedule.  - midodine and albumin with treatment.   -  UF 1000 cc achieved with HD on Wednesday and Friday -  Next HD on Monday. UF as tolerated.   2. Anemia of CKD: pt continues to require periodic blood transfusions, hemoglobin of 7.3  - epo has not been as effective as hoped secondary due to chronic inflammation from decubitus ulcer/infection   continue epogen with HD. This has been ordered as a standing order.   3. Secondary hyperparathyroidism: PTH low at 55 - phos was acceptable at 3.1 - no binders  4. ID: VRE in blood  Treatment as per ID and ICU team  5. Generalized Dependent edema/Anasarca due to 3rd spacing - UF with dialysis , as tolerated  6. Acute resp failure - back on vent as of AM 09/25/15  7. Stroke in early 08/2015   LOS: 44 Serene Kopf 11/6/20169:33 AM

## 2015-09-25 NOTE — Care Management (Signed)
Patient was placed on the ventilator 09/24/2015 after period of decreased responsiveness /respiratory acidosis.  Also requiring pressors.  Husband will be speaking with close family members about poor prognosis.  He will attempt to obtain responses from patient dealing with her wish for continued care in the presence of bedside nurses.

## 2015-09-26 LAB — RENAL FUNCTION PANEL
ALBUMIN: 1.7 g/dL — AB (ref 3.5–5.0)
ANION GAP: 5 (ref 5–15)
BUN: 69 mg/dL — ABNORMAL HIGH (ref 6–20)
CALCIUM: 7.8 mg/dL — AB (ref 8.9–10.3)
CO2: 31 mmol/L (ref 22–32)
Chloride: 103 mmol/L (ref 101–111)
Creatinine, Ser: 1.51 mg/dL — ABNORMAL HIGH (ref 0.44–1.00)
GFR calc non Af Amer: 39 mL/min — ABNORMAL LOW (ref >60)
GFR, EST AFRICAN AMERICAN: 45 mL/min — AB (ref >60)
Glucose, Bld: 150 mg/dL — ABNORMAL HIGH (ref 65–99)
PHOSPHORUS: 3.2 mg/dL (ref 2.5–4.6)
Potassium: 3.5 mmol/L (ref 3.5–5.1)
SODIUM: 139 mmol/L (ref 135–145)

## 2015-09-26 LAB — GLUCOSE, CAPILLARY
GLUCOSE-CAPILLARY: 116 mg/dL — AB (ref 65–99)
Glucose-Capillary: 121 mg/dL — ABNORMAL HIGH (ref 65–99)
Glucose-Capillary: 132 mg/dL — ABNORMAL HIGH (ref 65–99)
Glucose-Capillary: 147 mg/dL — ABNORMAL HIGH (ref 65–99)
Glucose-Capillary: 148 mg/dL — ABNORMAL HIGH (ref 65–99)

## 2015-09-26 LAB — CBC
HEMATOCRIT: 22.9 % — AB (ref 35.0–47.0)
HEMOGLOBIN: 7.2 g/dL — AB (ref 12.0–16.0)
MCH: 32.6 pg (ref 26.0–34.0)
MCHC: 31.6 g/dL — AB (ref 32.0–36.0)
MCV: 103.4 fL — ABNORMAL HIGH (ref 80.0–100.0)
Platelets: 141 10*3/uL — ABNORMAL LOW (ref 150–440)
RBC: 2.22 MIL/uL — ABNORMAL LOW (ref 3.80–5.20)
RDW: 20.6 % — AB (ref 11.5–14.5)
WBC: 12.1 10*3/uL — ABNORMAL HIGH (ref 3.6–11.0)

## 2015-09-26 LAB — PHOSPHORUS: Phosphorus: 3.1 mg/dL (ref 2.5–4.6)

## 2015-09-26 MED ORDER — VITAMIN B-1 100 MG PO TABS: 100.0000 mg | ORAL_TABLET | Freq: Every day | ORAL | Status: DC

## 2015-09-26 MED ORDER — PANTOPRAZOLE SODIUM 40 MG IV SOLR
40.0000 mg | INTRAVENOUS | Status: DC
  Administered 2015-09-26 – 2015-09-28 (×3): 40 mg via INTRAVENOUS
  Filled 2015-09-26 (×3): qty 40

## 2015-09-26 MED ORDER — ALBUMIN HUMAN 25 % IV SOLN
12.5000 g | Freq: Once | INTRAVENOUS | Status: AC
  Administered 2015-09-26: 12.5 g via INTRAVENOUS
  Filled 2015-09-26: qty 50

## 2015-09-26 MED ORDER — THIAMINE HCL 100 MG/ML IJ SOLN
100.0000 mg | Freq: Every day | INTRAMUSCULAR | Status: DC
  Administered 2015-09-27 – 2015-10-12 (×16): 100 mg via INTRAVENOUS
  Filled 2015-09-26 (×16): qty 2

## 2015-09-26 NOTE — Care Management (Signed)
Patient remains on ventilator but is off the pressors.  Unable to participate with physical therapy today.

## 2015-09-26 NOTE — Progress Notes (Signed)
OT Cancellation Note  Patient Details Name: Lisette GrinderHedda McMillian Monroe MRN: [id number] DOB: 01-05-1966   Cancelled Treatment:    Reason Eval/Treat Not Completed: Medical issues which prohibited therapy. They are recommending Hospice. Will hold Occupational Therapy    [name], Tracker Mance M 09/26/2015, 3:41 PM

## 2015-09-26 NOTE — Progress Notes (Signed)
[facility] [place] Critical Care Medicine Progess Note  Name: Courtney [name] MRN: [id number] DOB: Feb 26, 1966    ADMISSION DATE:  08/02/2015     INITIAL PRESENTATION:  49 AAF who has been in medical facilities (hosp, LTAC, rehab) for 2 yrs following gastric bypass surgery with multiple complications. Now with chronic trach, ESRD, profound debilitation, severe sacral pressure ulcer. Was seemingly making progress and transferred to rehab facility approx one week prior to this admission. She was sent to [facility] ED with AMS and hypotension. Working dx of severe sepsis/septic shock due to infected sacral pressure ulcer. Since admission. Her course is been very complicated with numerous complications including septic shock and GI bleeding. Currently, she has an NG tube in place, she takes in minimal by mouth nutrition, though she appears to be able to do this, however, she is refusing to eat. Patient with recurrent sepsis  INTERVAL HISTORY: Placed on vasopressors and placed back on vent 11/5, but was able to wean off levophed on 11/6 afternoon.  [name] changed out due to cuff leak on  11/5 PM, #6 Shiley cuffed  The patient was started on Megace and thiamine, on 09/22/2015 per the husband's request. 11/7>>husband at bedside updated on current plan. Plan for HD today, cont with TF, cont with daptomycin, awaiting bone cx and s/s from trach aspirate. Pressure in pilot balloon of trach in the 50s, if cont to be this high, might need to upsize trach.    MAJOR EVENTS/TEST RESULTS: Admission 02/07/14-05/07/14 Admission 07/21/14-09/06/14 Discharged to [facility]. Pt had palliative consult at that time, were asked to sign off by husband.  09/23 CT head: NAD 09/23 EEG: no epileptiform activity 09/23 PRBCs for Hgb 6.4 09/24 bedside debridement of sacral wound. Abscess drained 09/25 Off vasopressors. More alert. No distress. Worsening thrombocytopenia. Vanc DC'd 09/29  Dr. Sampson Goon (I.D) excused from the case by  patient's husband. 10/02 MRI -multiple infarcts 10/03 tracheal bleeding- transfused platelets 10/03 hospitalist service excused from the case by patient's husband 10/03 Echocardiogram ejection fraction was 55-60%, pulmonary systolic pressure was 39 mmHg 82/95 restart TF's at lower rate, attempt reg HD  10/12 Transferred to med-surg floor. Remains on PCCM service 10/14 SLP eval: pt unable to tolerate PMV adequately 10/17-will re-attempt PM valve-discussed with Speech therapist 10/18 passed swallow eval-start pureed thick foods no thin liquids-continue NG feeds 10/19 transferred to step down for sepsis/aspiration pneumonia 10/19 cxr shows RLL opacity 10/21 sacral decub debride at bedside by surgery 10/22 started back on vasopressors while on HD 10/27 CT with osteo, R hip fx, unable to identify tip of dubhoff tube - sent for fluoro study 10/28 Ortho consultation: I do not feel that she is a surgical candidate. Therefore, I feel that it would best to manage this fracture nonsurgically and allow it to heal by itself over time, which it should.  10/28 Gen Surg consultation: Due to the lack of free air or free flow of contrast and the peritoneum there is no indication for any surgical intervention on this. Would recommend pulling back the feeding tube 1-2 cm. Would recheck an abdominal film to confirm no pneumoperitoneum in the morning. Absence of any changes okay to continue using Dobbhoff for feeding and medications. 10/28 gastrograffin study: The study confirms that the feeding tube pes perforated through the duodenum and the tip is within a cavity that fills with injected contrast. The cavity does appear walled off 11/4 refuses oral feeds, continue TF's CT reviewed: T8-T9 discitis/osteomyelitis, RT HIP FRACTURE 11/5 placed back on vasopressors,  placed back on Vent due to resp acidosis, levophed turned off 11/6 afternoon 11/6 dubhoff occluded, removed and replaced, new dubhoff shows tube in the  antral stomach  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 Pneumoniae, P. Mirabilis, VRE Wound 9/26 >> No growth Blood 9/23 >> NEG CDiff 9/27>>neg Stool Cx 10/15>> negative Cdiff 10/25>>neg Trach Aspirate 10/28>> light growth pseudomonas Blood 10/28 >> 1/2 GPC >>  Sputum cultures obtained 09/22/15 due to mucus plugging>>Pseudomonas BONE TISSUE CULTURE PENDING  ANTIMICROBIALS:  Aztreonam 9/23 >> 9/24 Vanc 9/23 >> 9/25 Vanc 9/26>>9/27 Daptomycin 9/27>> 10/12, 10/28>>  Meropenem 9/23 >> 10/14, 10/28 >> 10/31   VITAL SIGNS: Temp:  [98 F (36.7 C)-98.7 F (37.1 C)] 98 F (36.7 C) (11/07 0743) Pulse Rate:  [110-125] 117 (11/07 0600) Resp:  [7-26] 20 (11/07 0600) BP: (98-136)/(51-80) 132/65 mmHg (11/07 0600) SpO2:  [87 %-100 %] 98 % (11/07 0600) FiO2 (%):  [35 %] 35 % (11/07 0300) Weight:  [209 lb 7 oz (95 kg)] 209 lb 7 oz (95 kg) (11/07 0526) HEMODYNAMICS:   VENTILATOR SETTINGS: Vent Mode:  [-] PRVC FiO2 (%):  [35 %] 35 % Set Rate:  [14 bmp] 14 bmp Vt Set:  [500 mL] 500 mL PEEP:  [5 cmH20] 5 cmH20 Plateau Pressure:  [15 cmH20] 15 cmH20 INTAKE / OUTPUT:  Intake/Output Summary (Last 24 hours) at 09/26/15 0809 Last data filed at 09/26/15 0600  Gross per 24 hour  Intake 1029.19 ml  Output      0 ml  Net 1029.19 ml    PHYSICAL EXAMINATION: Physical Examination:   VS: BP 132/65 mmHg  Pulse 117  Temp(Src) 98 F (36.7 C) (Axillary)  Resp 20  Ht  (1.753 m)  Wt 209 lb 7 oz (95 kg)  BMI 30.91 kg/m2  SpO2 98%  LMP  (LMP Unknown)  General Appearance: No resp distress, coarse upper airway sounds  Neuro:  profoundly diffusely weak HEENT: cushingoid facies, PERRLA, EOM intact Neck: trach site clean Pulmonary: clear anteriorly Cardiovascular: reg, + syst murmur Abdomen: soft, NT, +BS Extremities: warm, R>L UE edema Examination of the sacral decubitus ulcer- showed a very deep ulcer approximately 7-8 cm across.   stage IV ulcer.   LABORATORY PANEL:   CBC  Recent Labs Lab 09/26/15 0428  WBC 12.1*  HGB 7.2*  HCT 22.9*  PLT 141*    Chemistries   Recent Labs Lab 09/23/15 0503 09/26/15 0428  NA 139  --   K 3.7  --   CL 102  --   CO2 33*  --   GLUCOSE 222*  --   BUN 35*  --   CREATININE 1.19*  --   CALCIUM 7.8*  --   PHOS  --  3.1     CXR: NAD   IMPRESSION/PLAN: PULMONARY A: Chronic trach dependence-placed back on vent due to resp acidosis Trach changed 11/5 to #6 Shiley-Pressure in pilot balloon of trach in the 50s, if cont to be this high, might need to upsize trach.   History of recurrent hypercarbic resp failure History of DVT and pulmonary embolism History of obstructive sleep apnea P:  Continue every 4 hours DuoNeb's with Mucomyst and  frequent suctioning. BMET intermittently Monitor I/Os Correct electrolytes as indicated Intermittent HD per Renal Service (MWF) -plan to wean off vent as tolerated, wean off vasopressors as tolerated  GASTROINTESTINAL A:  LGIB, resolved Hx of C. Diff Stool incontinence  P:  Cont PPI Resume TFs 10/30 goal now at 50 cc/hr -will not be able to take oral feeds as she is on the vent  HEMATOLOGIC A:  Acute on chronic anemia S/p Thrombocytopenia - HIT panel negative 9/25, resolved P: DVT px: SCDs + SQ heparin Monitor CBC intermittently Transfuse per usual ICU guidelines  INFECTIOUS A:  Recurrent Severe sepsis, due to sacral decub, currently stable.  Sacral decubitus ulcer with abscess Currently awaiting culture results from bone scrapping and s/s of Pseudomonas from trach aspirate.  ID actively following  along, appreciate recs - currently on daptomycin  P:  Monitor temp, WBC count Micro and abx as above  ENDOCRINE A:  -DM 2, controlled. The patient developed some hypoglycemia when the tube feed was stopped and therefore she was started on D10 because she did not take in an adequate amount of calories to maintain her blood glucose levels in the normal range.  -Chronic steroid therapy, for a history of of adrenal insufficiency. P:  Monitor CBGs q 8 hrs - resume SSI if > 180 Cont hydrocortisone @ 10 mg BID Continue  tube feeds. We'll need to monitor blood glucose levels.  MSK A: Right hip fx - small intertrochanteric R fx - pain management. Ortho evaluated and felt not a surgical candidate at this time.   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 Belia HemanKasa and other members of the medical team has meet and discussed prognosis with patient's husband on 09/25/15 - Per Dr. Belia HemanKasa "I recommend Hospice and comfort care measures-patient with evidence of pain and suffering." - Per case manager: "Husband will be speaking with close family members about poor prognosis. He will attempt to obtain responses from patient dealing with her wish for continued care in the presence of bedside nurses." - at this time, continue with current medical care  I have personally obtained a history, examined the patient, evaluated Pertinent laboratory and Radio[employer]/imaging results, and  formulated the assessment and plan  The Patient requires high complexity decision making for assessment and support, frequent evaluation and titration of therapies, application of advanced monitoring technologies and extensive interpretation of multiple databases. Critical Care Time devoted to patient care services described in this note is 45 minutes.   Overall, patient is  critically ill, prognosis is guarded.  Patient with Multiorgan failure and at high risk for cardiac arrest and death. prognosis is very poor, patient with very poor chance of meaningful recovery. Patient with extensive osteomyelitis of the sacrum/coccyx/T8-T9 spine, will need PEG tube for long term.    Stephanie AcreVishal Jeren Dufrane, MD [facility] Pager [phone]- [phone] (please enter 7-digits) On Call Pager - [phone] (please enter 7-digits)

## 2015-09-26 NOTE — Progress Notes (Signed)
Nutrition Follow-up  DOCUMENTATION CODES:   Severe malnutrition in context of acute illness/injury  INTERVENTION:   EN: recommend continuing TF at rate of 50 ml/hr with Prostat 6 times per day, goal is 65 ml/hr   NUTRITION DIAGNOSIS:   Inadequate oral intake related to wound healing, acute illness, chronic illness as evidenced by NPO status, estimated needs.  GOAL:   Patient will meet greater than or equal to 90% of their needs  MONITOR:    (Energy Intake, Anthropometrics, Digestive System, Electrolyte/Renal Profile, Glucose Profile)  REASON FOR ASSESSMENT:   Consult Enteral/tube feeding initiation and management  ASSESSMENT:    Pt now on vent support via trach, required pressors over the weekend, plan for HD today  Diet Order: Dysphagia Diet discontinued over the weekend when pt placed on vent  EN: tolerating Vital 1.2 AF at rate of 50 ml/hr, Prostat 6 times per day      Digestive System:  Noted dobhoff clogged yesterday, dobhoff removed and new dobhoff tube replaced. Loose stool continues  Skin:   (stage IV sacrum, stage III hip, unstageable foot; rectal ulcer from fleixseal, ulcer in nose from dobhoff)  Electrolyte and Renal Profile:  Recent Labs Lab 09/20/15 0436 09/21/15 0459 09/23/15 0503 09/26/15 0428  BUN 37* 46* 35*  --   CREATININE 1.02* 1.26* 1.19*  --   NA 139 141 139  --   K 3.6 3.7 3.7  --   PHOS  --   --   --  3.1   Glucose Profile:  Recent Labs  09/26/15 0018 09/26/15 0417 09/26/15 0738  GLUCAP 147* 132* 148*   Meds: reviewed  Height:   Ht Readings from Last 1 Encounters:  Mar 09, 2015 5\' 9"  (1.753 m)    Weight:   Wt Readings from Last 1 Encounters:  09/26/15 209 lb 7 oz (95 kg)    BMI:  Body mass index is 30.91 kg/(m^2).  Estimated Nutritional Needs:   Kcal:  2175-2535 kcals (BEE 1509, 1.2 AF, 1.2-1.4 IF) or 2490-2905 kcals (30-35 kcals/kg)   Protein:  125-166 g (1.5-2.0 g/kg) but likely closer to 166-208 g (2.0-2.5 g/kg)    Fluid:  1000 mL plus UOP  EDUCATION NEEDS:   No education needs identified at this time  HIGH Care Level  Romelle Starcherate Bailie Christenbury MS, RD, LDN [phone] Pager

## 2015-09-26 NOTE — Progress Notes (Signed)
ANTIBIOTIC CONSULT NOTE - Follow Up  Pharmacy Consult for daptomycin  Indication: Bacteremia   Allergies  Allergen Reactions  . Contrast Media [Iodinated Diagnostic Agents] Anaphylaxis  . Ampicillin Rash    Patient Measurements: Height: 5\' 9"  (175.3 cm) Weight: 213 lb 13.5 oz (97 kg) IBW/kg (Calculated) : 66.2  Vital Signs: Temp: 98 F (36.7 C) (11/07 0743) Temp Source: Axillary (11/07 0743) BP: 97/64 mmHg (11/07 1200) Pulse Rate: 125 (11/07 1200) Intake/Output from previous day: 11/06 0701 - 11/07 0700 In: 1171.8 [I.V.:111.8; NG/GT:1060] Out: -  Intake/Output from this shift: Total I/O In: 200 [NG/GT:200] Out: -   Labs:  Recent Labs  09/25/15 1400 09/26/15 0428 09/26/15 1100  WBC 13.0* 12.1*  --   HGB 7.1* 7.2*  --   PLT 133* 141*  --   CREATININE  --   --  1.51*   Estimated Creatinine Clearance: 55.2 mL/min (by C-G formula based on Cr of 1.51). No results for input(s): VANCOTROUGH, VANCOPEAK, VANCORANDOM, GENTTROUGH, GENTPEAK, GENTRANDOM, TOBRATROUGH, TOBRAPEAK, TOBRARND, AMIKACINPEAK, AMIKACINTROU, AMIKACIN in the last 72 hours.   Microbiology: Recent Results (from the past 720 hour(s))  Stool culture     Status: None   Collection Time: 09/03/15  3:51 PM  Result Value Ref Range Status   Specimen Description STOOL  Final   Special Requests Immunocompromised  Final   Culture   Final    NO SALMONELLA OR SHIGELLA ISOLATED No Pathogenic E. coli detected NO CAMPYLOBACTER DETECTED    Report Status 09/07/2015 FINAL  Final  C difficile quick scan w PCR reflex     Status: None   Collection Time: 09/13/15 12:51 AM  Result Value Ref Range Status   C Diff antigen NEGATIVE NEGATIVE Final   C Diff toxin NEGATIVE NEGATIVE Final   C Diff interpretation Negative for C. difficile  Final  Culture, blood (routine x 2)     Status: None   Collection Time: 09/16/15 11:24 AM  Result Value Ref Range Status   Specimen Description BLOOD LEFT HAND  Final   Special Requests  BOTTLES DRAWN AEROBIC AND ANAEROBIC  1CC  Final   Culture NO GROWTH 5 DAYS  Final   Report Status 09/21/2015 FINAL  Final  Culture, blood (routine x 2)     Status: None   Collection Time: 09/16/15 12:23 PM  Result Value Ref Range Status   Specimen Description BLOOD RIGHT HAND  Final   Special Requests BOTTLES DRAWN AEROBIC AND ANAEROBIC  1CC  Final   Culture  Setup Time   Final    GRAM POSITIVE COCCI AEROBIC BOTTLE ONLY CRITICAL RESULT CALLED TO, READ BACK BY AND VERIFIED WITH: TESS THOMAS,RN 09/17/2015 0631 BY JRS.    Culture   Final    ENTEROCOCCUS FAECALIS AEROBIC BOTTLE ONLY VRE HAVE INTRINSIC RESISTANCE TO MOST COMMONLY USED ANTIBIOTICS AND THE ABILITY TO ACQUIRE RESISTANCE TO MOST AVAILABLE ANTIBIOTICS. CRITICAL RESULT CALLED TO, READ BACK BY AND VERIFIED WITH: CHERYL SMITH AT [id number] 09/19/15 DV    Report Status 09/21/2015 FINAL  Final   Organism ID, Bacteria ENTEROCOCCUS FAECALIS  Final      Susceptibility   Enterococcus faecalis - MIC*    AMPICILLIN <=2 SENSITIVE Sensitive     LINEZOLID 2 SENSITIVE Sensitive     CIPROFLOXACIN Value in next row Resistant      RESISTANT>=8    TETRACYCLINE Value in next row Resistant      RESISTANT>=16    VANCOMYCIN Value in next row Resistant  RESISTANT>=32    GENTAMICIN SYNERGY Value in next row Resistant      RESISTANT>=32    * ENTEROCOCCUS FAECALIS  Culture, respiratory (NON-Expectorated)     Status: None   Collection Time: 09/16/15  3:50 PM  Result Value Ref Range Status   Specimen Description TRACHEAL ASPIRATE  Final   Special Requests Immunocompromised  Final   Gram Stain   Final    FEW WBC SEEN GOOD SPECIMEN - 80-90% WBCS RARE GRAM NEGATIVE RODS    Culture LIGHT GROWTH PSEUDOMONAS AERUGINOSA  Final   Report Status 09/19/2015 FINAL  Final   Organism ID, Bacteria PSEUDOMONAS AERUGINOSA  Final      Susceptibility   Pseudomonas aeruginosa - MIC*    CEFTAZIDIME 8 SENSITIVE Sensitive     CIPROFLOXACIN 2 INTERMEDIATE  Intermediate     GENTAMICIN >=16 RESISTANT Resistant     IMIPENEM >=16 RESISTANT Resistant     PIP/TAZO Value in next row Sensitive      SENSITIVE32    CEFEPIME Value in next row Sensitive      SENSITIVE8    LEVOFLOXACIN Value in next row Resistant      RESISTANT>=8    * LIGHT GROWTH PSEUDOMONAS AERUGINOSA  Culture, expectorated sputum-assessment     Status: None   Collection Time: 09/22/15  2:09 PM  Result Value Ref Range Status   Specimen Description ENDOTRACHEAL  Final   Special Requests Normal  Final   Sputum evaluation THIS SPECIMEN IS ACCEPTABLE FOR SPUTUM CULTURE  Final   Report Status 09/24/2015 FINAL  Final  Culture, respiratory (NON-Expectorated)     Status: None (Preliminary result)   Collection Time: 09/22/15  2:09 PM  Result Value Ref Range Status   Specimen Description ENDOTRACHEAL  Final   Special Requests Normal Reflexed from [id number]  Final   Gram Stain   Final    FEW WBC SEEN FEW GRAM NEGATIVE RODS POOR SPECIMEN - LESS THAN 70% WBCS    Culture   Final    MODERATE GROWTH PSEUDOMONAS AERUGINOSA SUSCEPTIBILITIES TO FOLLOW    Report Status PENDING  Incomplete  Tissue culture     Status: None (Preliminary result)   Collection Time: 09/24/15  6:44 AM  Result Value Ref Range Status   Specimen Description BONE  Final   Special Requests Normal  Final   Gram Stain PENDING  Incomplete   Culture HOLDING FOR POSSIBLE PATHOGEN  Final   Report Status PENDING  Incomplete    Medical History: Past Medical History  Diagnosis Date  . Obesity   . Dyslipidemia   . Hypertension   . Coronary artery disease     s/p BMS 2010 LAD  . Dysrhythmia     ventricular tachycardia resolved after LAD stent and beta blocker  . Diabetes mellitus     with retinopathy, neuropathy and microalbuminemia  . ESRD (end stage renal disease) on dialysis     Assessment: 49 yo female admitted on 08/10/2015 with urosepsis and  infected sacral decubitus ulcer. Currently patient is being treated for  1 of 2 positive blood cultures growing VRE from 10/28.   Dr. Sampson Goon requested daptomycin be initiated on patient.  She is an MWF HD patient.   Daptomycin /kg every 48 hours was initiated.   WBC: 12.1 (down from 13), patient remains afebrile. CXR from 11/2 shows new infiltrate in LLL base. CrCl 55.2, Scr 1.51, patient has been intubated since 09/24/15.   Plan:  Will continue treatment with daptomycin  every 48 hours.  Pharmacy will continue to monitor labs and renal function and make adjustments as needed.  Recommend checking for increase in CPK levels at least once a week.   Cher Nakai, PharmD Pharmacy Resident 09/26/15 

## 2015-09-26 NOTE — Progress Notes (Signed)
PT Cancellation Note  Patient Details Name: Courtney GrinderHedda McMillian [name] MRN: [id number] DOB: 08-17-1966   Cancelled Treatment:    Reason Eval/Treat Not Completed: Medical issues which prohibited therapy (Per chart review, patient placed back on vent via trach 11/6; scheduled for dialysis this date.  Will hold PT this date due to medical status and re-attempt next date.  Per care team, physicians recommending hospice care/comfort measures at this time; husband considering goals of care/POC pending discussion with patient and other family members.  Will carefully monitor medical plan and POC to evaluate appropriateness of continued PT throughout remaining hospitalization.)  Demitri Kucinski H. Manson PasseyBrown, PT, DPT, NCS 09/26/2015, 2:56 PM [phone]

## 2015-09-26 NOTE — Progress Notes (Signed)
   09/26/15 1100  Clinical Encounter Type  Visited With Patient and family together;Health care provider  Visit Type Follow-up  Consult/Referral To Chaplain  Spiritual Encounters  Spiritual Needs Emotional  Stress Factors  Patient Stress Factors Not reviewed  Family Stress Factors Other (Comment)  Chaplain went to follow-up and check in with patient's spouse. When I first arrived around 8:45am he was unavailable as he was speaking with a doctor. When I returned we spoke about his feelings and thoughts. Tried to provide a compassionate presence and support. Chaplain Dyanne Yorks A. Magda Muise Ext. [phone]

## 2015-09-26 NOTE — Progress Notes (Signed)
Subjective:   Critically ill. Husband at bedside.  Seen and examined on hemodialysis. Hypotensive.  Off norepinephrine On ventilator.    Objective:  Vital signs in last 24 hours:  Temp:  [98 F (36.7 C)-98.5 F (36.9 C)] 98 F (36.7 C) (11/07 0743) Pulse Rate:  [110-125] 125 (11/07 1200) Resp:  [0-29] 24 (11/07 1200) BP: (92-140)/(51-87) 97/64 mmHg (11/07 1200) SpO2:  [87 %-100 %] 100 % (11/07 1100) FiO2 (%):  [35 %] 35 % (11/07 1214) Weight:  [95 kg (209 lb 7 oz)-97 kg (213 lb 13.5 oz)] 97 kg (213 lb 13.5 oz) (11/07 1100)  Weight change: 0 kg (0 lb) Filed Weights   09/25/15 0500 09/26/15 0526 09/26/15 1100  Weight: 95 kg (209 lb 7 oz) 95 kg (209 lb 7 oz) 97 kg (213 lb 13.5 oz)    Intake/Output: I/O last 3 completed shifts: In: 1627.1 [I.V.:437.1; NG/GT:1190] Out: -    Intake/Output this shift:  Total I/O In: 200 [NG/GT:200] Out: -   Physical Exam: General: No acute distress, critically ill  Head: NG in place  Eyes: anicteric  Neck: Trach in place  Lungs:  Scattered rhonchi, vent assisted PRVC  Heart: S1S2 +tachycardia  Abdomen:  Soft, nontender, BS present  Extremities: ++ dependent edema, anasarca  Neurologic:  did not wake up this morning   Access:  R IJ permcath.       Basic Metabolic Panel:  Recent Labs Lab 09/20/15 0436 09/21/15 0459 09/23/15 0503 09/26/15 0428 09/26/15 1100  NA 139 141 139  --  139  K 3.6 3.7 3.7  --  3.5  CL 101 102 102  --  103  CO2 32 32 33*  --  31  GLUCOSE 164* 69 222*  --  150*  BUN 37* 46* 35*  --  69*  CREATININE 1.02* 1.26* 1.19*  --  1.51*  CALCIUM 7.7* 8.1* 7.8*  --  7.8*  PHOS  --   --   --  3.1 3.2    Liver Function Tests:  Recent Labs Lab 09/26/15 1100  ALBUMIN 1.7*   No results for input(s): LIPASE, AMYLASE in the last 168 hours. No results for input(s): AMMONIA in the last 168 hours.  CBC:  Recent Labs Lab 09/20/15 0436 09/21/15 0459 09/23/15 0503 09/25/15 1400 09/26/15 0428  WBC 9.5  11.7* 13.6* 13.0* 12.1*  NEUTROABS  --   --   --  11.5*  --   HGB 7.2* 7.6* 7.3* 7.1* 7.2*  HCT 23.4* 24.7* 23.6* 23.4* 22.9*  MCV 103.8* 104.1* 105.1* 104.4* 103.4*  PLT 194 207 199 133* 141*    Cardiac Enzymes:  Recent Labs Lab 09/21/15 0459  CKTOTAL 10*    BNP: Invalid input(s): POCBNP  CBG:  Recent Labs Lab 09/25/15 1625 09/25/15 1957 09/26/15 0018 09/26/15 0417 09/26/15 0738  GLUCAP 138* 161* 147* 132* 148*    Microbiology: Results for orders placed or performed during the hospital encounter of 08/03/2015  Blood Culture (routine x 2)     Status: None   Collection Time: 08/18/2015  8:51 AM  Result Value Ref Range Status   Specimen Description BLOOD [name]  Final   Special Requests BOTTLES DRAWN AEROBIC AND ANAEROBIC  3CC  Final   Culture NO GROWTH 5 DAYS  Final   Report Status 08/17/2015 FINAL  Final  Blood Culture (routine x 2)     Status: None   Collection Time: 07/31/2015  9:20 AM  Result Value Ref Range Status  Specimen Description BLOOD LEFT ARM  Final   Special Requests BOTTLES DRAWN AEROBIC AND ANAEROBIC  1CC  Final   Culture NO GROWTH 5 DAYS  Final   Report Status 08/17/2015 FINAL  Final  Wound culture     Status: None   Collection Time: Oct 23, 2015  9:20 AM  Result Value Ref Range Status   Specimen Description DECUBITIS  Final   Special Requests Normal  Final   Gram Stain   Final    FEW WBC SEEN MANY GRAM NEGATIVE RODS RARE GRAM POSITIVE COCCI    Culture   Final    HEAVY GROWTH ESCHERICHIA COLI MODERATE GROWTH ENTEROBACTER AEROGENES PROTEUS MIRABILIS HEAVY GROWTH ENTEROCOCCUS SPECIES VRE HAVE INTRINSIC RESISTANCE TO MOST COMMONLY USED ANTIBIOTICS AND THE ABILITY TO ACQUIRE RESISTANCE TO MOST AVAILABLE ANTIBIOTICS.    Report Status 08/16/2015 FINAL  Final   Organism ID, Bacteria ESCHERICHIA COLI  Final   Organism ID, Bacteria ENTEROBACTER AEROGENES  Final   Organism ID, Bacteria PROTEUS MIRABILIS  Final   Organism ID, Bacteria ENTEROCOCCUS SPECIES   Final      Susceptibility   Enterobacter aerogenes - MIC*    CEFTAZIDIME <=1 SENSITIVE Sensitive     CEFAZOLIN >=64 RESISTANT Resistant     CEFTRIAXONE <=1 SENSITIVE Sensitive     CIPROFLOXACIN <=0.25 SENSITIVE Sensitive     GENTAMICIN <=1 SENSITIVE Sensitive     IMIPENEM 1 SENSITIVE Sensitive     TRIMETH/SULFA <=20 SENSITIVE Sensitive     * MODERATE GROWTH ENTEROBACTER AEROGENES   Escherichia coli - MIC*    AMPICILLIN <=2 SENSITIVE Sensitive     CEFTAZIDIME <=1 SENSITIVE Sensitive     CEFAZOLIN <=4 SENSITIVE Sensitive     CEFTRIAXONE <=1 SENSITIVE Sensitive     CIPROFLOXACIN <=0.25 SENSITIVE Sensitive     GENTAMICIN <=1 SENSITIVE Sensitive     IMIPENEM <=0.25 SENSITIVE Sensitive     TRIMETH/SULFA <=20 SENSITIVE Sensitive     * HEAVY GROWTH ESCHERICHIA COLI   Proteus mirabilis - MIC*    AMPICILLIN >=32 RESISTANT Resistant     CEFTAZIDIME <=1 SENSITIVE Sensitive     CEFAZOLIN 8 SENSITIVE Sensitive     CEFTRIAXONE <=1 SENSITIVE Sensitive     CIPROFLOXACIN <=0.25 SENSITIVE Sensitive     GENTAMICIN <=1 SENSITIVE Sensitive     IMIPENEM 1 SENSITIVE Sensitive     TRIMETH/SULFA <=20 SENSITIVE Sensitive     * PROTEUS MIRABILIS   Enterococcus species - MIC*    AMPICILLIN >=32 RESISTANT Resistant     VANCOMYCIN >=32 RESISTANT Resistant     GENTAMICIN SYNERGY SENSITIVE Sensitive     TETRACYCLINE Value in next row Resistant      RESISTANT>=16    * HEAVY GROWTH ENTEROCOCCUS SPECIES  MRSA PCR Screening     Status: None   Collection Time: Oct 23, 2015  2:38 PM  Result Value Ref Range Status   MRSA by PCR NEGATIVE NEGATIVE Final    Comment:        The GeneXpert MRSA Assay (FDA approved for NASAL specimens only), is one component of a comprehensive MRSA colonization surveillance program. It is not intended to diagnose MRSA infection nor to guide or monitor treatment for MRSA infections.   Blood culture (single)     Status: None   Collection Time: Oct 23, 2015  3:36 PM  Result Value Ref  Range Status   Specimen Description BLOOD RIGHT ASSIST CONTROL  Final   Special Requests BOTTLES DRAWN AEROBIC AND ANAEROBIC 5ML  Final   Culture NO GROWTH  5 DAYS  Final   Report Status 08/17/2015 FINAL  Final  Wound culture     Status: None   Collection Time: 08/13/15 12:37 PM  Result Value Ref Range Status   Specimen Description WOUND  Final   Special Requests Normal  Final   Gram Stain   Final    FEW WBC SEEN TOO NUMEROUS TO COUNT GRAM NEGATIVE RODS FEW GRAM POSITIVE COCCI    Culture   Final    HEAVY GROWTH ESCHERICHIA COLI MODERATE GROWTH PROTEUS MIRABILIS LIGHT GROWTH KLEBSIELLA PNEUMONIAE MODERATE GROWTH ENTEROCOCCUS GALLINARUM CRITICAL RESULT CALLED TO, READ BACK BY AND VERIFIED WITH: [facility]ORBA AT 1042 08/16/15 DV    Report Status 08/17/2015 FINAL  Final   Organism ID, Bacteria ESCHERICHIA COLI  Final   Organism ID, Bacteria PROTEUS MIRABILIS  Final   Organism ID, Bacteria KLEBSIELLA PNEUMONIAE  Final   Organism ID, Bacteria ENTEROCOCCUS GALLINARUM  Final      Susceptibility   Escherichia coli - MIC*    AMPICILLIN >=32 RESISTANT Resistant     CEFTAZIDIME 4 RESISTANT Resistant     CEFAZOLIN >=64 RESISTANT Resistant     CEFTRIAXONE 16 RESISTANT Resistant     GENTAMICIN 2 SENSITIVE Sensitive     IMIPENEM >=16 RESISTANT Resistant     TRIMETH/SULFA <=20 SENSITIVE Sensitive     Extended ESBL POSITIVE Resistant     PIP/TAZO Value in next row Resistant      RESISTANT>=128    CIPROFLOXACIN Value in next row Sensitive      SENSITIVE<=0.25    * HEAVY GROWTH ESCHERICHIA COLI   Klebsiella pneumoniae - MIC*    AMPICILLIN Value in next row Resistant      SENSITIVE<=0.25    CEFTAZIDIME Value in next row Resistant      SENSITIVE<=0.25    CEFAZOLIN Value in next row Resistant      SENSITIVE<=0.25    CEFTRIAXONE Value in next row Resistant      SENSITIVE<=0.25    CIPROFLOXACIN Value in next row Resistant      SENSITIVE<=0.25    GENTAMICIN Value in next row Sensitive       SENSITIVE<=0.25    IMIPENEM Value in next row Resistant      SENSITIVE<=0.25    TRIMETH/SULFA Value in next row Resistant      SENSITIVE<=0.25    PIP/TAZO Value in next row Resistant      RESISTANT>=128    * LIGHT GROWTH KLEBSIELLA PNEUMONIAE   Proteus mirabilis - MIC*    AMPICILLIN Value in next row Resistant      RESISTANT>=128    CEFTAZIDIME Value in next row Sensitive      RESISTANT>=128    CEFAZOLIN Value in next row Sensitive      RESISTANT>=128    CEFTRIAXONE Value in next row Sensitive      RESISTANT>=128    CIPROFLOXACIN Value in next row Sensitive      RESISTANT>=128    GENTAMICIN Value in next row Sensitive      RESISTANT>=128    IMIPENEM Value in next row Sensitive      RESISTANT>=128    TRIMETH/SULFA Value in next row Sensitive      RESISTANT>=128    PIP/TAZO Value in next row Sensitive      SENSITIVE<=4    * MODERATE GROWTH PROTEUS MIRABILIS   Enterococcus gallinarum - MIC*    AMPICILLIN Value in next row Resistant      SENSITIVE<=4    GENTAMICIN SYNERGY Value in  next row Sensitive      SENSITIVE<=4    CIPROFLOXACIN Value in next row Resistant      RESISTANT>=8    TETRACYCLINE Value in next row Resistant      RESISTANT>=16    * MODERATE GROWTH ENTEROCOCCUS GALLINARUM  Wound culture     Status: None   Collection Time: 08/15/15  2:53 PM  Result Value Ref Range Status   Specimen Description WOUND  Final   Special Requests NONE  Final   Gram Stain FEW WBC SEEN NO ORGANISMS SEEN   Final   Culture NO GROWTH 3 DAYS  Final   Report Status 08/18/2015 FINAL  Final  C difficile quick scan w PCR reflex     Status: None   Collection Time: 08/17/15 11:34 AM  Result Value Ref Range Status   C Diff antigen NEGATIVE NEGATIVE Final   C Diff toxin NEGATIVE NEGATIVE Final   C Diff interpretation Negative for C. difficile  Final  Stool culture     Status: None   Collection Time: 09/03/15  3:51 PM  Result Value Ref Range Status   Specimen Description STOOL  Final    Special Requests Immunocompromised  Final   Culture   Final    NO SALMONELLA OR SHIGELLA ISOLATED No Pathogenic E. coli detected NO CAMPYLOBACTER DETECTED    Report Status 09/07/2015 FINAL  Final  C difficile quick scan w PCR reflex     Status: None   Collection Time: 09/13/15 12:51 AM  Result Value Ref Range Status   C Diff antigen NEGATIVE NEGATIVE Final   C Diff toxin NEGATIVE NEGATIVE Final   C Diff interpretation Negative for C. difficile  Final  Culture, blood (routine x 2)     Status: None   Collection Time: 09/16/15 11:24 AM  Result Value Ref Range Status   Specimen Description BLOOD LEFT HAND  Final   Special Requests BOTTLES DRAWN AEROBIC AND ANAEROBIC  1CC  Final   Culture NO GROWTH 5 DAYS  Final   Report Status 09/21/2015 FINAL  Final  Culture, blood (routine x 2)     Status: None   Collection Time: 09/16/15 12:23 PM  Result Value Ref Range Status   Specimen Description BLOOD RIGHT HAND  Final   Special Requests BOTTLES DRAWN AEROBIC AND ANAEROBIC  1CC  Final   Culture  Setup Time   Final    GRAM POSITIVE COCCI AEROBIC BOTTLE ONLY CRITICAL RESULT CALLED TO, READ BACK BY AND VERIFIED WITH: TESS THOMAS,RN 09/17/2015 0631 BY JRS.    Culture   Final    ENTEROCOCCUS FAECALIS AEROBIC BOTTLE ONLY VRE HAVE INTRINSIC RESISTANCE TO MOST COMMONLY USED ANTIBIOTICS AND THE ABILITY TO ACQUIRE RESISTANCE TO MOST AVAILABLE ANTIBIOTICS. CRITICAL RESULT CALLED TO, READ BACK BY AND VERIFIED WITH: CHERYL SMITH AT 5284 09/19/15 DV    Report Status 09/21/2015 FINAL  Final   Organism ID, Bacteria ENTEROCOCCUS FAECALIS  Final      Susceptibility   Enterococcus faecalis - MIC*    AMPICILLIN <=2 SENSITIVE Sensitive     LINEZOLID 2 SENSITIVE Sensitive     CIPROFLOXACIN Value in next row Resistant      RESISTANT>=8    TETRACYCLINE Value in next row Resistant      RESISTANT>=16    VANCOMYCIN Value in next row Resistant      RESISTANT>=32    GENTAMICIN SYNERGY Value in next row  Resistant      RESISTANT>=32    * ENTEROCOCCUS FAECALIS  Culture, respiratory (NON-Expectorated)     Status: None   Collection Time: 09/16/15  3:50 PM  Result Value Ref Range Status   Specimen Description TRACHEAL ASPIRATE  Final   Special Requests Immunocompromised  Final   Gram Stain   Final    FEW WBC SEEN GOOD SPECIMEN - 80-90% WBCS RARE GRAM NEGATIVE RODS    Culture LIGHT GROWTH PSEUDOMONAS AERUGINOSA  Final   Report Status 09/19/2015 FINAL  Final   Organism ID, Bacteria PSEUDOMONAS AERUGINOSA  Final      Susceptibility   Pseudomonas aeruginosa - MIC*    CEFTAZIDIME 8 SENSITIVE Sensitive     CIPROFLOXACIN 2 INTERMEDIATE Intermediate     GENTAMICIN >=16 RESISTANT Resistant     IMIPENEM >=16 RESISTANT Resistant     PIP/TAZO Value in next row Sensitive      SENSITIVE32    CEFEPIME Value in next row Sensitive      SENSITIVE8    LEVOFLOXACIN Value in next row Resistant      RESISTANT>=8    * LIGHT GROWTH PSEUDOMONAS AERUGINOSA  Culture, expectorated sputum-assessment     Status: None   Collection Time: 09/22/15  2:09 PM  Result Value Ref Range Status   Specimen Description ENDOTRACHEAL  Final   Special Requests Normal  Final   Sputum evaluation THIS SPECIMEN IS ACCEPTABLE FOR SPUTUM CULTURE  Final   Report Status 09/24/2015 FINAL  Final  Culture, respiratory (NON-Expectorated)     Status: None (Preliminary result)   Collection Time: 09/22/15  2:09 PM  Result Value Ref Range Status   Specimen Description ENDOTRACHEAL  Final   Special Requests Normal Reflexed from [id number]  Final   Gram Stain   Final    FEW WBC SEEN FEW GRAM NEGATIVE RODS POOR SPECIMEN - LESS THAN 70% WBCS    Culture   Final    MODERATE GROWTH PSEUDOMONAS AERUGINOSA SUSCEPTIBILITIES TO FOLLOW    Report Status PENDING  Incomplete  Tissue culture     Status: None (Preliminary result)   Collection Time: 09/24/15  6:44 AM  Result Value Ref Range Status   Specimen Description BONE  Final   Special  Requests Normal  Final   Gram Stain PENDING  Incomplete   Culture HOLDING FOR POSSIBLE PATHOGEN  Final   Report Status PENDING  Incomplete    Coagulation Studies: No results for input(s): LABPROT, INR in the last 72 hours.  Urinalysis: No results for input(s): COLORURINE, LABSPEC, PHURINE, GLUCOSEU, HGBUR, BILIRUBINUR, KETONESUR, PROTEINUR, UROBILINOGEN, NITRITE, LEUKOCYTESUR in the last 72 hours.  Invalid input(s): APPERANCEUR    Imaging: Dg Abd 1 View  09/25/2015  CLINICAL DATA:  Feeding tube placement. EXAM: ABDOMEN - 1 VIEW COMPARISON:  CT scan 09/16/2015 FINDINGS: The feeding tube tip is in the antral region of the stomach. Stable intraperitoneal barium related to previous perforation. IMPRESSION: Feeding tube tip is in the antral region of the stomach. Electronically Signed   By: Rudie Meyer M.D.   On: 09/25/2015 15:31     Medications:   . feeding supplement (VITAL AF 1.2 CAL) 1,000 mL (09/25/15 0424)   . acetylcysteine  3 mL Nebulization QID  . albumin human  12.5 g Intravenous Once  . antiseptic oral rinse  7 mL Mouth Rinse q12n4p  . chlorhexidine  15 mL Mouth Rinse BID  . DAPTOmycin (CUBICIN)  IV  500 mg Intravenous Q48H  . epoetin (EPOGEN/PROCRIT) injection  10,000 Units Intravenous Q M,W,F-HD  . feeding supplement (PRO-STAT SUGAR FREE 64)  30 mL Per Tube 6 times per day  . folic acid  1 mg Oral Daily  . free water  20 mL Per Tube 6 times per day  . heparin subcutaneous  5,000 Units Subcutaneous Q12H  . hydrocortisone  10 mg Per Tube BID  . insulin aspart  0-9 Units Subcutaneous 6 times per day  . ipratropium-albuterol  3 mL Nebulization QID  . lidocaine  1 patch Transdermal Q24H  . megestrol  400 mg Oral BID  . midodrine  5 mg Per Tube TID WC  . multivitamin  5 mL Oral Daily  . nystatin   Topical TID  . pantoprazole (PROTONIX) IV  40 mg Intravenous Q24H  . sodium chloride  10-40 mL Intracatheter Q12H  . sodium hypochlorite   Irrigation BID  . [START ON  09/27/2015] thiamine IV  100 mg Intravenous Daily   sodium chloride, sodium chloride, acetaminophen (TYLENOL) oral liquid 160 mg/5 mL, HYDROmorphone (DILAUDID) injection, morphine injection, oxyCODONE, sodium chloride  Assessment/ Plan:  49 y.o. 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 Admitted on 07/25/2015  1. End-stage renal disease on hemodialysis on HD MWF. The patient has been on dialysis since October of 2014. R IJ permcath.   - Plan on continuing hemodialysis on MWF schedule.  - midodine and albumin with treatment.   -  UF 1000 cc achieved with HD on Wednesday and Friday of last week. Will attempt UF with treatment.   2. Anemia of CKD: pt continues to require periodic blood transfusions, hemoglobin of 7.2 - epo has not been as effective as hoped secondary due to chronic inflammation from decubitus ulcer/infection   continue epogen with HD. This has been ordered as a standing order.   3. Secondary hyperparathyroidism: PTH low at 55 - phos was acceptable at 3.1 - no binders  4. ID: VRE in blood  Treatment as per ID and ICU team  5. Generalized Dependent edema/Anasarca due to 3rd spacing - UF with dialysis , as tolerated  6. Acute resp failure - back on vent as of AM 09/25/15  7. Stroke in early 08/2015   LOS: 45 Thaddius Manes 11/7/201612:50 PM

## 2015-09-27 LAB — CK: CK TOTAL: 10 U/L — AB (ref 38–234)

## 2015-09-27 LAB — BLOOD GAS, ARTERIAL
Acid-Base Excess: 10.9 mmol/L — ABNORMAL HIGH (ref 0.0–3.0)
Allens test (pass/fail): POSITIVE — AB
Bicarbonate: 35.1 meq/L — ABNORMAL HIGH (ref 21.0–28.0)
FIO2: 35
MECHVT: 500 mL
O2 Saturation: 98.3 %
PEEP: 5 cmH2O
Patient temperature: 37
RATE: 14 {breaths}/min
pCO2 arterial: 45 mmHg (ref 32.0–48.0)
pH, Arterial: 7.5 — ABNORMAL HIGH (ref 7.350–7.450)
pO2, Arterial: 101 mmHg (ref 83.0–108.0)

## 2015-09-27 LAB — GLUCOSE, CAPILLARY
GLUCOSE-CAPILLARY: 120 mg/dL — AB (ref 65–99)
GLUCOSE-CAPILLARY: 116 mg/dL — AB (ref 65–99)
Glucose-Capillary: 112 mg/dL — ABNORMAL HIGH (ref 65–99)
Glucose-Capillary: 115 mg/dL — ABNORMAL HIGH (ref 65–99)
Glucose-Capillary: 121 mg/dL — ABNORMAL HIGH (ref 65–99)
Glucose-Capillary: 126 mg/dL — ABNORMAL HIGH (ref 65–99)
Glucose-Capillary: 139 mg/dL — ABNORMAL HIGH (ref 65–99)

## 2015-09-27 MED ORDER — SULFAMETHOXAZOLE-TRIMETHOPRIM 800-160 MG PO TABS
2.5000 | ORAL_TABLET | Freq: Every day | ORAL | Status: AC
  Administered 2015-09-27 – 2015-10-16 (×19): 2.5
  Administered 2015-10-17: 18:00:00
  Administered 2015-10-18 – 2015-11-07 (×20): 2.5
  Filled 2015-09-27 (×4): qty 3
  Filled 2015-09-27: qty 1
  Filled 2015-09-27: qty 2.5
  Filled 2015-09-27 (×2): qty 3
  Filled 2015-09-27: qty 2.5
  Filled 2015-09-27 (×3): qty 3
  Filled 2015-09-27: qty 2
  Filled 2015-09-27 (×4): qty 3
  Filled 2015-09-27: qty 2
  Filled 2015-09-27 (×3): qty 3
  Filled 2015-09-27: qty 1
  Filled 2015-09-27 (×3): qty 3
  Filled 2015-09-27: qty 1
  Filled 2015-09-27: qty 3
  Filled 2015-09-27: qty 1
  Filled 2015-09-27 (×8): qty 3
  Filled 2015-09-27: qty 1
  Filled 2015-09-27 (×3): qty 3
  Filled 2015-09-27: qty 1
  Filled 2015-09-27 (×3): qty 3
  Filled 2015-09-27: qty 1

## 2015-09-27 MED ORDER — SULFAMETHOXAZOLE-TRIMETHOPRIM 800-160 MG PO TABS: 5.0000 | ORAL_TABLET | ORAL | Status: DC

## 2015-09-27 NOTE — Evaluation (Signed)
Physical Therapy Re-Evaluation Patient Details Name: Courtney [name] MRN: [id number] DOB: Mar 12, 1966 Today's Date: 09/27/2015   History of Present Illness  Pt is a [age] F who has been in medical facilities (hosp, LTAC, rehab) for 2 yrs following gastric bypass surgery with multiple complications. Now with chronic trach, ESRD, profound debilitation, severe sacral pressure ulcer (stage IV). Was seemingly making progress and transferred to rehab facility approx one week prior to this admission. She was sent to [facility] ED 9/23 with AMS and hypotension. Admitted with sepsis related to infected sacral ulcer.  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  Patient transferred to CCU to med-surg floor during hospitalization, but experienced profound interdialytic hypotension requiring return transfer to CCU for use of pressors (now off); remains in CCU.  Noted with R hip intertrochanteric fracture, recommended for conservative management per orthopedics.  Hospital course continues to be complicated with patient requiring return to vent 11/5 for respiratory decline with use of pressors (now off); remains on vent, but alert and intermittently following commands.  New order received for continued PT as tolerated. Per care team, physicians recommending hospice care/comfort measures at this time; husband considering goals of care/POC pending discussion with patient and other family members, but wishes continued medical care at this time. Will carefully monitor medical plan and POC to evaluate appropriateness of continued PT throughout remaining hospitalization.   Clinical Impression  Upon evaluation, patient alert; visually tracks therapist.  Intermittent shakes head "no" during session when asked to actively participate with skilled activities, but does not physically  resist therapist this date.  Patient remains globally weak and deconditioned and profoundly limited by multiple medical conditions.  Demonstrates very limited active movement (spontaneous of L UE only) throughout this session.  Does appear to indicate some degree of pain when HOB elevated to appropriate elevation (30 degrees) for resumption of tube feeds; patient verbalizing "ouch" over vent/trach, but unable to localize or describe further.  Elevation stopped at 30 degrees to allow for patient comfort; RN informed/aware. Provided patient continues to demonstrate some degree of active effort and is medically appropriate for continued services, skilled PT will continue with emphasis on bilat UE and L LE ROM for strengthening (as able), contracture prevention and prevention of further skin breakdown. Will closely monitor response to and participation with treatment and overall medical condition, goals of care to ensure appropriateness of continued PT efforts.     Follow Up Recommendations LTACH;SNF    Equipment Recommendations       Recommendations for Other Services       Precautions / Restrictions Precautions Precautions: Fall Precaution Comments: stage 4 sacral ulcer, NG tube/NPO, trach, contact isolation, R IJ permcath, L IJ central line, log roll only (as needed for dressing changes/hygiene) and no ROM to R LE/no attempts at sitting, mechanical vent via trach Restrictions Weight Bearing Restrictions: No      Mobility  Bed Mobility               General bed mobility comments: Deferred/contraindicated  Transfers                 General transfer comment: Deferred/contraindicated  Ambulation/Gait                Stairs            Wheelchair Mobility    Modified Rankin (Stroke Patients Only)  Balance                                             Pertinent Vitals/Pain Pain Assessment: Faces Faces Pain Scale: Hurts even  more Pain Location: with repositioning of bed to 30 degrees elevation (due to running tube feeds), patient verbalizing "ouch" over vent/trach; unable to localize or describe to therapist.  Additional elevation beyond 30 degrees deferred for patient comfort. Pain Intervention(s): Limited activity within patient's tolerance;Monitored during session;Repositioned    Home Living Family/patient expects to be discharged to:: Skilled nursing facility                 Additional Comments: patient unable to provide information regarding PLOF or level of participation/function prior to hospitalization    Prior Function Level of Independence: Needs assistance         Comments: patient unable to provide information regarding PLOF or level of participation/function prior to hospitalization; will verify from family/resources as appropriate     Hand Dominance        Extremity/Trunk Assessment   Upper Extremity Assessment:  (R UE generally flaccid, 0/5, with generalized edema throughout extremity.  Suspect significant sensory deficits throughout extremity.  L UE with spontaneous movement at elbow, wrist, hand, at least 2/5, but patient without participation with formal asses)           Lower Extremity Assessment:  (R LE not assessed due to mobility restrictions related to hip fracture; L LE flaccid this date without patient attempts at active movement)         Communication   Communication: Tracheostomy (patient intermittent participating with conversation attempts via head nods/shakes)  Cognition Arousal/Alertness: Awake/alert Behavior During Therapy: Flat affect Overall Cognitive Status: Difficult to assess (visually tracks therapist, intermittent responds with head hod/shake)                      General Comments      Exercises Other Exercises Other Exercises: Passive ROM to bilat UEs, 1x10: gross grasp/release, wrist flex/ext, forearm pronation/supination, elbow  flex/ext, shoulder flex/ext/IR/ER.  Patient spontaneously moving L UE with at least 2/5 strength at times, but did not actively participate with ROM/therex this date. Other Exercises: Passive ROM to L LE, 1x10: ankle pumps, ankle circumduction, hip/knee flex/ext.  No active movement noted L LE this date.      Assessment/Plan    PT Assessment Patient needs continued PT services  PT Diagnosis Difficulty walking;Generalized weakness;Acute pain;Hemiplegia non-dominant side;Hemiplegia dominant side   PT Problem List Decreased strength;Decreased range of motion;Decreased activity tolerance;Decreased balance;Decreased mobility;Decreased coordination;Decreased cognition;Decreased knowledge of use of DME;Decreased safety awareness;Decreased knowledge of precautions;Cardiopulmonary status limiting activity;Impaired sensation;Decreased skin integrity;Pain  PT Treatment Interventions Therapeutic exercise;Manual techniques;Patient/family education;Therapeutic activities;Neuromuscular re-education;Cognitive remediation   PT Goals (Current goals can be found in the Care Plan section) Acute Rehab PT Goals PT Goal Formulation: Patient unable to participate in goal setting Time For Goal Achievement: 10/06/15 Potential to Achieve Goals: Poor    Frequency Min 2X/week   Barriers to discharge Decreased caregiver support      Co-evaluation               End of Session   Activity Tolerance: Patient limited by pain Patient left: in bed;with call bell/phone within reach Nurse Communication:  (indications of pain with HOB elevation)  Time: 1610-9604 PT Time Calculation (min) (ACUTE ONLY): 17 min   Charges:   PT Evaluation $PT Re-evaluation: 1 Procedure PT Treatments $Therapeutic Exercise: 8-22 mins   PT G Codes:        Courtney [name] H. Manson Passey, PT, DPT, NCS 09/27/2015, 4:42 PM [phone]

## 2015-09-27 NOTE — Progress Notes (Signed)
ID fu No fevers, wbc stable.  Had bone curretage by Dr Excell Seltzer with cxs now available  BP 107/59 mmHg  Pulse 120  Temp(Src) 99.2 F (37.3 C) (Axillary)  Resp 28  Ht  (1.753 m)  Wt 97.6 kg (215 lb 2.7 oz)  BMI 31.76 kg/m2  SpO2 100%  LMP  (LMP Unknown)   Specimen Description BONE      Special Requests Normal     Gram Stain --     Result:     MODERATE WBC SEEN  FEW GRAM NEGATIVE RODS      Culture --     Result:     MODERATE GROWTH ESCHERICHIA COLI  MODERATE GROWTH KLEBSIELLA PNEUMONIAE  ESBL-EXTENDED SPECTRUM BETA LACTAMASE-THE ORGANISM IS RESISTANT TO PENICILLINS, CEPHALOSPORINS AND AZTREONAM ACCORDING TO CLSI M100-S15 VOL.25 N01 JAN 2005. ORGANISM 1  This organism isolate is resistant to one or more antiotic agents in three or more antimicrobial categories. Suggest Infectious Disease consult.  ORGANISM 2  CRITICAL RESULT CALLED TO, READ BACK BY AND VERIFIED WITH: RN Mardene Celeste [facility] 09/27/15 1005AM      Report Status PENDING     Organism ID, Bacteria ESCHERICHIA COLI     Organism ID, Bacteria KLEBSIELLA PNEUMONIAE    Culture & Susceptibility    ESCHERICHIA COLI    Antibiotic Sensitivity Microscan Status   AMPICILLIN Resistant >=32 RESISTANT Final   Method: MIC   CEFAZOLIN Resistant >=64 RESISTANT Final   Method: MIC   CEFTAZIDIME Resistant 4 RESISTANT Final   Method: MIC   CEFTRIAXONE Resistant 8 RESISTANT Final   Method: MIC   Extended ESBL Resistant POSITIVE Final   Method: MIC   GENTAMICIN Sensitive <=1 SENSITIVE Final   Method: MIC   IMIPENEM Resistant 8 RESISTANT Final   Method: MIC   PIP/TAZO Resistant RESISTANT >=128 Final   Method: MIC   TRIMETH/SULFA Sensitive <=20 SENSITIVE Final   Method: MIC   Comments ESCHERICHIA COLI (MIC)   MODERATE GROWTH ESCHERICHIA COLI         KLEBSIELLA PNEUMONIAE    Antibiotic Sensitivity Microscan Status   AMPICILLIN Resistant >=32 RESISTANT Final   Method: MIC   CEFAZOLIN  Resistant >=64 RESISTANT Final   Method: MIC   CEFTAZIDIME Resistant >=64 RESISTANT Final   Method: MIC   CEFTRIAXONE Resistant >=64 RESISTANT Final   Method: MIC   CIPROFLOXACIN Resistant >=4 RESISTANT Final   Method: MIC   GENTAMICIN Resistant >=16 RESISTANT Final   Method: MIC   IMIPENEM Resistant >=16 RESISTANT Final   Method: MIC   PIP/TAZO Resistant RESISTANT >=128 Final   Method: MIC   TRIMETH/SULFA Sensitive <=20 SENSITIVE Final   Method: MIC   Comments KLEBSIELLA PNEUMONIAE (MIC)   MODERATE GROWTH KLEBSIELLA PNEUMONIAE            Prior wound cultures since admission 9/23 - VRE, Proteus, E coli and enterobacter  9/24 - E coli, Klebsiella (MDRO), Proteus and enterococcus 9/26 no growth 11/5 - bone currette - Kleb and E coli ESBL   Recs Sacral decub with underlying osteomyelitis- previous cultures have grown multiple drug resistant organisms included above however recent bone cx now growing E coli ESBL and Klebsiella Pna both of which are sensitive to bactrim. She has had several bedside debridements and is getting local wound care Would start bactrim -renally dosed for 6 week course.  Bacteremia - enterococcus 10/28 S/p 12 days of daptomycin Can stop daptomycin 11/10  Thank you very much for the consult. Will follow with  you.

## 2015-09-27 NOTE — Progress Notes (Signed)
[facility] Critical Care Medicine Progess Note  Name: Courtney [name] MRN: [id number] DOB: 06-Jul-1966    ADMISSION DATE:  August 25, 2015     INITIAL PRESENTATION:  49 AAF who has been in medical facilities (hosp, LTAC, rehab) for 2 yrs following gastric bypass surgery with multiple complications. Now with chronic trach, ESRD, profound debilitation, severe sacral pressure ulcer. Was seemingly making progress and transferred to rehab facility approx one week prior to this admission. She was sent to [facility] ED with AMS and hypotension. Working dx of severe sepsis/septic shock due to infected sacral pressure ulcer. Since admission. Her course is been very complicated with numerous complications including septic shock and GI bleeding. Currently, she has an NG tube in place, she takes in minimal by mouth nutrition, though she appears to be able to do this, however, she is refusing to eat. Patient with recurrent sepsis  INTERVAL HISTORY: Placed on vasopressors and placed back on vent 11/5, but was able to wean off levophed on 11/6 afternoon.  Courtney [name] changed out due to cuff leak on  11/5 PM, #6 Shiley cuffed  The patient was started on Megace and thiamine, on 09/22/2015 per the husband's request.  11/7>>husband at bedside updated on current plan. Plan for HD today, cont with TF, cont with daptomycin, awaiting bone cx and s/s from trach aspirate. Pressure in pilot balloon of trach in the 50s, if cont to be this high, might need to upsize trach.    11/8> no acute events overnight, mild thin secretions noted, was on the vent at 35% FiO2 during the night. Creatinine slightly worse compared to yesterday; had dialysis yesterday, 1 L removed. Sacral Bone culture shows Klebsiella pneumonia and Escherichia coli  MAJOR EVENTS/TEST RESULTS: Admission 02/07/14-05/07/14 Admission 07/21/14-09/06/14 Discharged to [facility]. Pt had palliative consult at that time, were asked to sign off by husband.  09/23 CT head:  NAD 09/23 EEG: no epileptiform activity 09/23 PRBCs for Hgb 6.4 09/24 bedside debridement of sacral wound. Abscess drained 09/25 Off vasopressors. More alert. No distress. Worsening thrombocytopenia. Vanc DC'd 09/29  Dr. Sampson Goon (I.D) excused from the case by patient's husband. 10/02 MRI -multiple infarcts 10/03 tracheal bleeding- transfused platelets 10/03 hospitalist service excused from the case by patient's husband 10/03 Echocardiogram ejection fraction was 55-60%, pulmonary systolic pressure was 39 mmHg 40/98 restart TF's at lower rate, attempt reg HD  10/12 Transferred to med-surg floor. Remains on PCCM service 10/14 SLP eval: pt unable to tolerate PMV adequately 10/17-will re-attempt PM valve-discussed with Speech therapist 10/18 passed swallow eval-start pureed thick foods no thin liquids-continue NG feeds 10/19 transferred to step down for sepsis/aspiration pneumonia 10/19 cxr shows RLL opacity 10/21 sacral decub debride at bedside by surgery 10/22 started back on vasopressors while on HD 10/27 CT with osteo, R hip fx, unable to identify tip of dubhoff tube - sent for fluoro study 10/28 Ortho consultation: I do not feel that she is a surgical candidate. Therefore, I feel that it would best to manage this fracture nonsurgically and allow it to heal by itself over time, which it should.  10/28 Gen Surg consultation: Due to the lack of free air or free flow of contrast and the peritoneum there is no indication for any surgical intervention on this. Would recommend pulling back the feeding tube 1-2 cm. Would recheck an abdominal film to confirm no pneumoperitoneum in the morning. Absence of any changes okay to continue using Dobbhoff for feeding and medications. 10/28 gastrograffin study: The study confirms that the feeding  tube pes perforated through the duodenum and the tip is within a cavity that fills with injected contrast. The cavity does appear walled off 11/4 refuses oral  feeds, continue TF's CT reviewed: T8-T9 discitis/osteomyelitis, RT HIP FRACTURE 11/5 placed back on vasopressors, placed back on Vent due to resp acidosis, levophed turned off 11/6 afternoon 11/6 dubhoff occluded, removed and replaced, new dubhoff shows tube in the antral stomach  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 Pneumoniae, P. Mirabilis, VRE Wound 9/26 >> No growth Blood 9/23 >> NEG CDiff 9/27>>neg Stool Cx 10/15>> negative Cdiff 10/25>>neg Trach Aspirate 10/28>> light growth pseudomonas Blood 10/28 >> 1/2 GPC >>  Sputum cultures obtained 09/22/15 due to mucus plugging>>Pseudomonas BONE TISSUE CULTURE PENDING  ANTIMICROBIALS:  Aztreonam 9/23 >> 9/24 Vanc 9/23 >> 9/25 Vanc 9/26>>9/27 Daptomycin 9/27>> 10/12, 10/28>>  Meropenem 9/23 >> 10/14, 10/28 >> 10/31   VITAL SIGNS: Temp:  [98.2 F (36.8 C)-99.3 F (37.4 C)] 99.2 F (37.3 C) (11/08 0739) Pulse Rate:  [109-130] 122 (11/08 1015) Resp:  [0-31] 23 (11/08 1015) BP: (92-129)/(54-77) 108/56 mmHg (11/08 1015) SpO2:  [90 %-100 %] 100 % (11/08 1015) FiO2 (%):  [35 %] 35 % (11/08 0800) Weight:  [213 lb 13.5 oz (97 kg)-215 lb 2.7 oz (97.6 kg)] 215 lb 2.7 oz (97.6 kg) (11/08 0500) HEMODYNAMICS:   VENTILATOR SETTINGS: Vent Mode:  [-] PRVC FiO2 (%):  [35 %] 35 % Set Rate:  [14 bmp] 14 bmp Vt Set:  [500 mL] 500 mL PEEP:  [5 cmH20] 5 cmH20 Pressure Support:  [15 cmH20] 15 cmH20 INTAKE / OUTPUT:  Intake/Output Summary (Last 24 hours) at 09/27/15 1021 Last data filed at 09/27/15 0200  Gross per 24 hour  Intake    920 ml  Output    441 ml  Net    479 ml     PHYSICAL EXAMINATION: Physical Examination:   VS: BP 108/56 mmHg  Pulse 122  Temp(Src) 99.2 F (37.3 C) (Axillary)  Resp 23  Ht 5\' 9"  (1.753 m)  Wt 215 lb 2.7 oz (97.6 kg)  BMI 31.76 kg/m2  SpO2 100%  LMP  (LMP Unknown)  General Appearance: No resp distress, coarse upper airway sounds  Neuro: profoundly diffusely weak HEENT: cushingoid facies, PERRLA, EOM intact Neck: trach site clean Pulmonary: clear anteriorly Cardiovascular: reg, + syst murmur Abdomen: soft, NT, +BS Extremities: warm, R>L UE edema Examination of the sacral decubitus ulcer- showed a very deep ulcer approximately 7-8 cm across.   stage IV ulcer.   LABORATORY PANEL:   CBC  Recent Labs Lab 09/26/15 0428  WBC 12.1*  HGB 7.2*  HCT 22.9*  PLT 141*    Chemistries   Recent Labs Lab 09/26/15 1100  NA 139  K 3.5  CL 103  CO2 31  GLUCOSE 150*  BUN 69*  CREATININE 1.51*  CALCIUM 7.8*  PHOS 3.2     CXR: NAD   IMPRESSION/PLAN: PULMONARY A: Chronic trach dependence-placed back on vent due to resp acidosis Trach changed 11/5 to #6 Shiley-Pressure in pilot balloon of trach in the 50s, if cont to be this high, might need to upsize trach.   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 BMET intermittently Monitor I/Os Correct electrolytes as indicated Intermittent HD per Renal Service (MWF) -plan to wean off vent as tolerated, wean off vasopressors as tolerated  GASTROINTESTINAL A:  LGIB, resolved Hx of C. Diff Stool incontinence  P:  Cont PPI Resume TFs 10/30 goal now at 50 cc/hr -will not be able to take oral feeds as she is on the vent  HEMATOLOGIC A:  Acute on chronic anemia S/p Thrombocytopenia - HIT panel negative 9/25, resolved P: DVT px: SCDs + SQ heparin Monitor CBC intermittently Transfuse per usual ICU  guidelines  INFECTIOUS A:  Recurrent Severe sepsis, due to sacral decub, currently stable.  Sacral decubitus ulcer with abscess Currently awaiting culture results from bone scrapping and s/s of Pseudomonas from trach aspirate.  ID actively following along, appreciate recs - currently on daptomycin  P:  Monitor temp, WBC count Micro and abx as above  ENDOCRINE A:  -DM 2, controlled. The patient developed some hypoglycemia when the tube feed was stopped and therefore she was started on D10 because she did not take in an adequate amount of calories to maintain her blood glucose levels in the normal range.  -Chronic steroid therapy, for a history of of adrenal insufficiency. P:  Monitor CBGs q 8 hrs - resume SSI if > 180 Cont hydrocortisone @ 10 mg BID Continue  tube feeds. We'll need to monitor blood glucose levels.  MSK A: Right hip fx - small intertrochanteric R fx - pain management. Ortho evaluated and felt not a surgical candidate at this time.   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 Belia Heman and other members of the medical team has meet and discussed prognosis with patient's husband on 09/25/15 - Per Dr. Belia Heman "I recommend Hospice and comfort care measures-patient with evidence of pain and suffering." - Per case manager: "Husband will be speaking with close family members about poor prognosis. He will attempt to obtain responses from patient dealing with her wish for continued care in the presence of bedside nurses." - at this time, continue with current medical care  I have personally obtained a history, examined the patient, evaluated Pertinent laboratory and Radio[employer]/imaging results, and  formulated the assessment and plan  The Patient requires high complexity decision making for assessment and support,  frequent evaluation and titration of therapies, application of advanced monitoring technologies and extensive interpretation of multiple databases. Critical Care Time devoted to patient care services described in this note is 45 minutes.   Overall, patient is critically ill, prognosis is guarded.  Patient with Multiorgan failure and at high risk for cardiac arrest and death. prognosis is very poor, patient with very poor chance of meaningful recovery. Patient with extensive osteomyelitis of the sacrum/coccyx/T8-T9 spine, will need PEG tube for long term.    Stephanie Acre, MD [facility] Pager [phone] (please enter 7-digits) On Call Pager - [phone] (please enter 7-digits)

## 2015-09-27 NOTE — Progress Notes (Signed)
Subjective:   Critically ill.  Hemodialysis yesterday. UF 441.  Off vasopressors. Tolerating tube feeds.    Objective:  Vital signs in last 24 hours:  Temp:  [98.2 F (36.8 C)-99.3 F (37.4 C)] 99.2 F (37.3 C) (11/08 0739) Pulse Rate:  [109-130] 114 (11/08 0700) Resp:  [0-31] 15 (11/08 0700) BP: (92-140)/(54-87) 125/65 mmHg (11/08 0700) SpO2:  [90 %-100 %] 100 % (11/08 0700) FiO2 (%):  [35 %] 35 % (11/08 0800) Weight:  [97 kg (213 lb 13.5 oz)-97.6 kg (215 lb 2.7 oz)] 97.6 kg (215 lb 2.7 oz) (11/08 0500)  Weight change: 2 kg (4 lb 6.6 oz) Filed Weights   09/26/15 1100 09/26/15 1440 09/27/15 0500  Weight: 97 kg (213 lb 13.5 oz) 97 kg (213 lb 13.5 oz) 97.6 kg (215 lb 2.7 oz)    Intake/Output: I/O last 3 completed shifts: In: 1470 [I.V.:10; NG/GT:1350; IV Piggyback:110] Out: 441 [Other:441]   Intake/Output this shift:     Physical Exam: General: No acute distress, critically ill  Head: NG in place, eyes open  Eyes: anicteric  Neck: Trach in place  Lungs:  Scattered rhonchi, vent assisted PRVC  Heart: S1S2 +tachycardia  Abdomen:  Soft, nontender, BS present  Extremities: ++ dependent edema, anasarca  Neurologic: Not waking up off sedation  Access:  R IJ permcath.       Basic Metabolic Panel:  Recent Labs Lab 09/21/15 0459 09/23/15 0503 09/26/15 0428 09/26/15 1100  NA 141 139  --  139  K 3.7 3.7  --  3.5  CL 102 102  --  103  CO2 32 33*  --  31  GLUCOSE 69 222*  --  150*  BUN 46* 35*  --  69*  CREATININE 1.26* 1.19*  --  1.51*  CALCIUM 8.1* 7.8*  --  7.8*  PHOS  --   --  3.1 3.2    Liver Function Tests:  Recent Labs Lab 09/26/15 1100  ALBUMIN 1.7*   No results for input(s): LIPASE, AMYLASE in the last 168 hours. No results for input(s): AMMONIA in the last 168 hours.  CBC:  Recent Labs Lab 09/21/15 0459 09/23/15 0503 09/25/15 1400 09/26/15 0428  WBC 11.7* 13.6* 13.0* 12.1*  NEUTROABS  --   --  11.5*  --   HGB 7.6* 7.3* 7.1* 7.2*  HCT  24.7* 23.6* 23.4* 22.9*  MCV 104.1* 105.1* 104.4* 103.4*  PLT 207 199 133* 141*    Cardiac Enzymes:  Recent Labs Lab 09/21/15 0459  CKTOTAL 10*    BNP: Invalid input(s): POCBNP  CBG:  Recent Labs Lab 09/26/15 0738 09/26/15 1558 09/26/15 2238 09/27/15 09/27/15 0356  GLUCAP 148* 116* 121* 115* 126*    Microbiology: Results for orders placed or performed during the hospital encounter of 08-02-15  Blood Culture (routine x 2)     Status: None   Collection Time: 08-02-15  8:51 AM  Result Value Ref Range Status   Specimen Description BLOOD Dolores HooseUNKO  Final   Special Requests BOTTLES DRAWN AEROBIC AND ANAEROBIC  3CC  Final   Culture NO GROWTH 5 DAYS  Final   Report Status 08/17/2015 FINAL  Final  Blood Culture (routine x 2)     Status: None   Collection Time: 08-02-15  9:20 AM  Result Value Ref Range Status   Specimen Description BLOOD LEFT ARM  Final   Special Requests BOTTLES DRAWN AEROBIC AND ANAEROBIC  1CC  Final   Culture NO GROWTH 5 DAYS  Final  Report Status 08/17/2015 FINAL  Final  Wound culture     Status: None   Collection Time: 08/16/2015  9:20 AM  Result Value Ref Range Status   Specimen Description DECUBITIS  Final   Special Requests Normal  Final   Gram Stain   Final    FEW WBC SEEN MANY GRAM NEGATIVE RODS RARE GRAM POSITIVE COCCI    Culture   Final    HEAVY GROWTH ESCHERICHIA COLI MODERATE GROWTH ENTEROBACTER AEROGENES PROTEUS MIRABILIS HEAVY GROWTH ENTEROCOCCUS SPECIES VRE HAVE INTRINSIC RESISTANCE TO MOST COMMONLY USED ANTIBIOTICS AND THE ABILITY TO ACQUIRE RESISTANCE TO MOST AVAILABLE ANTIBIOTICS.    Report Status 08/16/2015 FINAL  Final   Organism ID, Bacteria ESCHERICHIA COLI  Final   Organism ID, Bacteria ENTEROBACTER AEROGENES  Final   Organism ID, Bacteria PROTEUS MIRABILIS  Final   Organism ID, Bacteria ENTEROCOCCUS SPECIES  Final      Susceptibility   Enterobacter aerogenes - MIC*    CEFTAZIDIME <=1 SENSITIVE Sensitive     CEFAZOLIN >=64  RESISTANT Resistant     CEFTRIAXONE <=1 SENSITIVE Sensitive     CIPROFLOXACIN <=0.25 SENSITIVE Sensitive     GENTAMICIN <=1 SENSITIVE Sensitive     IMIPENEM 1 SENSITIVE Sensitive     TRIMETH/SULFA <=20 SENSITIVE Sensitive     * MODERATE GROWTH ENTEROBACTER AEROGENES   Escherichia coli - MIC*    AMPICILLIN <=2 SENSITIVE Sensitive     CEFTAZIDIME <=1 SENSITIVE Sensitive     CEFAZOLIN <=4 SENSITIVE Sensitive     CEFTRIAXONE <=1 SENSITIVE Sensitive     CIPROFLOXACIN <=0.25 SENSITIVE Sensitive     GENTAMICIN <=1 SENSITIVE Sensitive     IMIPENEM <=0.25 SENSITIVE Sensitive     TRIMETH/SULFA <=20 SENSITIVE Sensitive     * HEAVY GROWTH ESCHERICHIA COLI   Proteus mirabilis - MIC*    AMPICILLIN >=32 RESISTANT Resistant     CEFTAZIDIME <=1 SENSITIVE Sensitive     CEFAZOLIN 8 SENSITIVE Sensitive     CEFTRIAXONE <=1 SENSITIVE Sensitive     CIPROFLOXACIN <=0.25 SENSITIVE Sensitive     GENTAMICIN <=1 SENSITIVE Sensitive     IMIPENEM 1 SENSITIVE Sensitive     TRIMETH/SULFA <=20 SENSITIVE Sensitive     * PROTEUS MIRABILIS   Enterococcus species - MIC*    AMPICILLIN >=32 RESISTANT Resistant     VANCOMYCIN >=32 RESISTANT Resistant     GENTAMICIN SYNERGY SENSITIVE Sensitive     TETRACYCLINE Value in next row Resistant      RESISTANT>=16    * HEAVY GROWTH ENTEROCOCCUS SPECIES  MRSA PCR Screening     Status: None   Collection Time: 08/18/2015  2:38 PM  Result Value Ref Range Status   MRSA by PCR NEGATIVE NEGATIVE Final    Comment:        The GeneXpert MRSA Assay (FDA approved for NASAL specimens only), is one component of a comprehensive MRSA colonization surveillance program. It is not intended to diagnose MRSA infection nor to guide or monitor treatment for MRSA infections.   Blood culture (single)     Status: None   Collection Time: 08/10/2015  3:36 PM  Result Value Ref Range Status   Specimen Description BLOOD RIGHT ASSIST CONTROL  Final   Special Requests BOTTLES DRAWN AEROBIC AND  ANAEROBIC  Final   Culture NO GROWTH 5 DAYS  Final   Report Status 08/17/2015 FINAL  Final  Wound culture     Status: None   Collection Time: 08/13/15 12:37 PM  Result Value  Ref Range Status   Specimen Description WOUND  Final   Special Requests Normal  Final   Gram Stain   Final    FEW WBC SEEN TOO NUMEROUS TO COUNT GRAM NEGATIVE RODS FEW GRAM POSITIVE COCCI    Culture   Final    HEAVY GROWTH ESCHERICHIA COLI MODERATE GROWTH PROTEUS MIRABILIS LIGHT GROWTH KLEBSIELLA PNEUMONIAE MODERATE GROWTH ENTEROCOCCUS GALLINARUM CRITICAL RESULT CALLED TO, READ BACK BY AND VERIFIED WITH: [facility] BORBA AT 1042 08/16/15 DV    Report Status 08/17/2015 FINAL  Final   Organism ID, Bacteria ESCHERICHIA COLI  Final   Organism ID, Bacteria PROTEUS MIRABILIS  Final   Organism ID, Bacteria KLEBSIELLA PNEUMONIAE  Final   Organism ID, Bacteria ENTEROCOCCUS GALLINARUM  Final      Susceptibility   Escherichia coli - MIC*    AMPICILLIN >=32 RESISTANT Resistant     CEFTAZIDIME 4 RESISTANT Resistant     CEFAZOLIN >=64 RESISTANT Resistant     CEFTRIAXONE 16 RESISTANT Resistant     GENTAMICIN 2 SENSITIVE Sensitive     IMIPENEM >=16 RESISTANT Resistant     TRIMETH/SULFA <=20 SENSITIVE Sensitive     Extended ESBL POSITIVE Resistant     PIP/TAZO Value in next row Resistant      RESISTANT>=128    CIPROFLOXACIN Value in next row Sensitive      SENSITIVE<=0.25    * HEAVY GROWTH ESCHERICHIA COLI   Klebsiella pneumoniae - MIC*    AMPICILLIN Value in next row Resistant      SENSITIVE<=0.25    CEFTAZIDIME Value in next row Resistant      SENSITIVE<=0.25    CEFAZOLIN Value in next row Resistant      SENSITIVE<=0.25    CEFTRIAXONE Value in next row Resistant      SENSITIVE<=0.25    CIPROFLOXACIN Value in next row Resistant      SENSITIVE<=0.25    GENTAMICIN Value in next row Sensitive      SENSITIVE<=0.25    IMIPENEM Value in next row Resistant      SENSITIVE<=0.25    TRIMETH/SULFA Value in next row  Resistant      SENSITIVE<=0.25    PIP/TAZO Value in next row Resistant      RESISTANT>=128    * LIGHT GROWTH KLEBSIELLA PNEUMONIAE   Proteus mirabilis - MIC*    AMPICILLIN Value in next row Resistant      RESISTANT>=128    CEFTAZIDIME Value in next row Sensitive      RESISTANT>=128    CEFAZOLIN Value in next row Sensitive      RESISTANT>=128    CEFTRIAXONE Value in next row Sensitive      RESISTANT>=128    CIPROFLOXACIN Value in next row Sensitive      RESISTANT>=128    GENTAMICIN Value in next row Sensitive      RESISTANT>=128    IMIPENEM Value in next row Sensitive      RESISTANT>=128    TRIMETH/SULFA Value in next row Sensitive      RESISTANT>=128    PIP/TAZO Value in next row Sensitive      SENSITIVE<=4    * MODERATE GROWTH PROTEUS MIRABILIS   Enterococcus gallinarum - MIC*    AMPICILLIN Value in next row Resistant      SENSITIVE<=4    GENTAMICIN SYNERGY Value in next row Sensitive      SENSITIVE<=4    CIPROFLOXACIN Value in next row Resistant      RESISTANT>=8    TETRACYCLINE Value in next  row Resistant      RESISTANT>=16    * MODERATE GROWTH ENTEROCOCCUS GALLINARUM  Wound culture     Status: None   Collection Time: 08/15/15  2:53 PM  Result Value Ref Range Status   Specimen Description WOUND  Final   Special Requests NONE  Final   Gram Stain FEW WBC SEEN NO ORGANISMS SEEN   Final   Culture NO GROWTH 3 DAYS  Final   Report Status 08/18/2015 FINAL  Final  C difficile quick scan w PCR reflex     Status: None   Collection Time: 08/17/15 11:34 AM  Result Value Ref Range Status   C Diff antigen NEGATIVE NEGATIVE Final   C Diff toxin NEGATIVE NEGATIVE Final   C Diff interpretation Negative for C. difficile  Final  Stool culture     Status: None   Collection Time: 09/03/15  3:51 PM  Result Value Ref Range Status   Specimen Description STOOL  Final   Special Requests Immunocompromised  Final   Culture   Final    NO SALMONELLA OR SHIGELLA ISOLATED No Pathogenic  E. coli detected NO CAMPYLOBACTER DETECTED    Report Status 09/07/2015 FINAL  Final  C difficile quick scan w PCR reflex     Status: None   Collection Time: 09/13/15 12:51 AM  Result Value Ref Range Status   C Diff antigen NEGATIVE NEGATIVE Final   C Diff toxin NEGATIVE NEGATIVE Final   C Diff interpretation Negative for C. difficile  Final  Culture, blood (routine x 2)     Status: None   Collection Time: 09/16/15 11:24 AM  Result Value Ref Range Status   Specimen Description BLOOD LEFT HAND  Final   Special Requests BOTTLES DRAWN AEROBIC AND ANAEROBIC  1CC  Final   Culture NO GROWTH 5 DAYS  Final   Report Status 09/21/2015 FINAL  Final  Culture, blood (routine x 2)     Status: None   Collection Time: 09/16/15 12:23 PM  Result Value Ref Range Status   Specimen Description BLOOD RIGHT HAND  Final   Special Requests BOTTLES DRAWN AEROBIC AND ANAEROBIC  1CC  Final   Culture  Setup Time   Final    GRAM POSITIVE COCCI AEROBIC BOTTLE ONLY CRITICAL RESULT CALLED TO, READ BACK BY AND VERIFIED WITH: TESS THOMAS,RN 09/17/2015 0631 BY JRS.    Culture   Final    ENTEROCOCCUS FAECALIS AEROBIC BOTTLE ONLY VRE HAVE INTRINSIC RESISTANCE TO MOST COMMONLY USED ANTIBIOTICS AND THE ABILITY TO ACQUIRE RESISTANCE TO MOST AVAILABLE ANTIBIOTICS. CRITICAL RESULT CALLED TO, READ BACK BY AND VERIFIED WITH: CHERYL SMITH AT 1610 09/19/15 DV    Report Status 09/21/2015 FINAL  Final   Organism ID, Bacteria ENTEROCOCCUS FAECALIS  Final      Susceptibility   Enterococcus faecalis - MIC*    AMPICILLIN <=2 SENSITIVE Sensitive     LINEZOLID 2 SENSITIVE Sensitive     CIPROFLOXACIN Value in next row Resistant      RESISTANT>=8    TETRACYCLINE Value in next row Resistant      RESISTANT>=16    VANCOMYCIN Value in next row Resistant      RESISTANT>=32    GENTAMICIN SYNERGY Value in next row Resistant      RESISTANT>=32    * ENTEROCOCCUS FAECALIS  Culture, respiratory (NON-Expectorated)     Status: None    Collection Time: 09/16/15  3:50 PM  Result Value Ref Range Status   Specimen Description TRACHEAL ASPIRATE  Final   Special Requests Immunocompromised  Final   Gram Stain   Final    FEW WBC SEEN GOOD SPECIMEN - 80-90% WBCS RARE GRAM NEGATIVE RODS    Culture LIGHT GROWTH PSEUDOMONAS AERUGINOSA  Final   Report Status 09/19/2015 FINAL  Final   Organism ID, Bacteria PSEUDOMONAS AERUGINOSA  Final      Susceptibility   Pseudomonas aeruginosa - MIC*    CEFTAZIDIME 8 SENSITIVE Sensitive     CIPROFLOXACIN 2 INTERMEDIATE Intermediate     GENTAMICIN >=16 RESISTANT Resistant     IMIPENEM >=16 RESISTANT Resistant     PIP/TAZO Value in next row Sensitive      SENSITIVE32    CEFEPIME Value in next row Sensitive      SENSITIVE8    LEVOFLOXACIN Value in next row Resistant      RESISTANT>=8    * LIGHT GROWTH PSEUDOMONAS AERUGINOSA  Culture, expectorated sputum-assessment     Status: None   Collection Time: 09/22/15  2:09 PM  Result Value Ref Range Status   Specimen Description ENDOTRACHEAL  Final   Special Requests Normal  Final   Sputum evaluation THIS SPECIMEN IS ACCEPTABLE FOR SPUTUM CULTURE  Final   Report Status 09/24/2015 FINAL  Final  Culture, respiratory (NON-Expectorated)     Status: None (Preliminary result)   Collection Time: 09/22/15  2:09 PM  Result Value Ref Range Status   Specimen Description ENDOTRACHEAL  Final   Special Requests Normal Reflexed from [id number]  Final   Gram Stain   Final    FEW WBC SEEN FEW GRAM NEGATIVE RODS POOR SPECIMEN - LESS THAN 70% WBCS    Culture   Final    MODERATE GROWTH PSEUDOMONAS AERUGINOSA SUSCEPTIBILITIES TO FOLLOW    Report Status PENDING  Incomplete  Tissue culture     Status: None (Preliminary result)   Collection Time: 09/24/15  6:44 AM  Result Value Ref Range Status   Specimen Description BONE  Final   Special Requests Normal  Final   Gram Stain MODERATE WBC SEEN FEW GRAM NEGATIVE RODS   Final   Culture HOLDING FOR POSSIBLE  PATHOGEN  Final   Report Status PENDING  Incomplete    Coagulation Studies: No results for input(s): LABPROT, INR in the last 72 hours.  Urinalysis: No results for input(s): COLORURINE, LABSPEC, PHURINE, GLUCOSEU, HGBUR, BILIRUBINUR, KETONESUR, PROTEINUR, UROBILINOGEN, NITRITE, LEUKOCYTESUR in the last 72 hours.  Invalid input(s): APPERANCEUR    Imaging: Dg Abd 1 View  09/25/2015  CLINICAL DATA:  Feeding tube placement. EXAM: ABDOMEN - 1 VIEW COMPARISON:  CT scan 09/16/2015 FINDINGS: The feeding tube tip is in the antral region of the stomach. Stable intraperitoneal barium related to previous perforation. IMPRESSION: Feeding tube tip is in the antral region of the stomach. Electronically Signed   By: Rudie Meyer M.D.   On: 09/25/2015 15:31     Medications:   . feeding supplement (VITAL AF 1.2 CAL) 1,000 mL (09/25/15 0424)   . antiseptic oral rinse  7 mL Mouth Rinse q12n4p  . chlorhexidine  15 mL Mouth Rinse BID  . DAPTOmycin (CUBICIN)  IV  500 mg Intravenous Q48H  . epoetin (EPOGEN/PROCRIT) injection  10,000 Units Intravenous Q M,W,F-HD  . feeding supplement (PRO-STAT SUGAR FREE 64)  30 mL Per Tube 6 times per day  . folic acid  1 mg Oral Daily  . free water  20 mL Per Tube 6 times per day  . heparin subcutaneous  5,000 Units Subcutaneous Q12H  .  hydrocortisone  10 mg Per Tube BID  . insulin aspart  0-9 Units Subcutaneous 6 times per day  . ipratropium-albuterol  3 mL Nebulization QID  . lidocaine  1 patch Transdermal Q24H  . megestrol  400 mg Oral BID  . midodrine  5 mg Per Tube TID WC  . multivitamin  5 mL Oral Daily  . nystatin   Topical TID  . pantoprazole (PROTONIX) IV  40 mg Intravenous Q24H  . sodium chloride  10-40 mL Intracatheter Q12H  . sodium hypochlorite   Irrigation BID  . thiamine IV  100 mg Intravenous Daily   sodium chloride, sodium chloride, acetaminophen (TYLENOL) oral liquid 160 mg/5 mL, HYDROmorphone (DILAUDID) injection, morphine injection,  oxyCODONE, sodium chloride  Assessment/ Plan:  49 y.o. 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 Admitted on 2015-08-24  1. End-stage renal disease on hemodialysis on HD MWF. The patient has been on dialysis since October of 2014. R IJ permcath.  Tolerated dialysis yesterday with IV albumin for oncotic support - Plan on continuing hemodialysis on MWF schedule.  - midodine and albumin with next treatment.   -  UF 1000 cc achieved with HD on Wednesday and Friday of last week. Will attempt UF with treatment.   2. Anemia of CKD: pt continues to require periodic blood transfusions, hemoglobin of 7.2 from 11/7  continue epogen with HD. This has been ordered as a standing order.  Multiple transfusions during hospitalization  3. Secondary hyperparathyroidism: PTH low at 55 - phos was acceptable at 3.3 - no binders  4. Sepsis: VRE in blood - Treatment as per ID and ICU team: daptomycin  5. Generalized Dependent edema/Anasarca due to 3rd spacing - UF with dialysis with albumin IV and midodrine , as tolerated  6. Acute resp failure - back on vent as of AM 09/25/15 - Appreciate pulmonary input.    LOS: 46 Courtney [name] 11/8/20169:07 AM

## 2015-09-27 NOTE — Progress Notes (Signed)
   09/27/15 1939  Clinical Encounter Type  Visited With Patient  Visit Type Follow-up  Consult/Referral To Chaplain  Spiritual Encounters  Spiritual Needs Other (Comment)  Stress Factors  Patient Stress Factors Not reviewed  Chaplain rounded in the unit and offered a kind word and a compassionate presence to the patient. Chaplain Waunita Sandstrom A. Keyshia Orwick Ext. [phone]

## 2015-09-27 NOTE — Progress Notes (Signed)
ANTIBIOTIC CONSULT NOTE - Follow Up  Pharmacy Consult for daptomycin  Indication: Bacteremia   Allergies  Allergen Reactions  . Contrast Media [Iodinated Diagnostic Agents] Anaphylaxis  . Ampicillin Rash    Patient Measurements: Height:  (175.3 cm) Weight: 215 lb 2.7 oz (97.6 kg) IBW/kg (Calculated) : 66.2  Vital Signs: Temp: 99.2 F (37.3 C) (11/08 0739) Temp Source: Axillary (11/08 0739) BP: 107/59 mmHg (11/08 1100) Pulse Rate: 120 (11/08 1100) Intake/Output from previous day: 11/07 0701 - 11/08 0700 In: 1070 [I.V.:10; NG/GT:950; IV Piggyback:110] Out: 441   Labs:  Recent Labs  09/25/15 1400 09/26/15 0428 09/26/15 1100  WBC 13.0* 12.1*  --   HGB 7.1* 7.2*  --   PLT 133* 141*  --   CREATININE  --   --  1.51*   Estimated Creatinine Clearance: 55.4 mL/min (by C-G formula based on Cr of 1.51). No results for input(s): VANCOTROUGH, VANCOPEAK, VANCORANDOM, GENTTROUGH, GENTPEAK, GENTRANDOM, TOBRATROUGH, TOBRAPEAK, TOBRARND, AMIKACINPEAK, AMIKACINTROU, AMIKACIN in the last 72 hours.   Microbiology: Recent Results (from the past 720 hour(s))  Stool culture     Status: None   Collection Time: 09/03/15  3:51 PM  Result Value Ref Range Status   Specimen Description STOOL  Final   Special Requests Immunocompromised  Final   Culture   Final    NO SALMONELLA OR SHIGELLA ISOLATED No Pathogenic E. coli detected NO CAMPYLOBACTER DETECTED    Report Status 09/07/2015 FINAL  Final  C difficile quick scan w PCR reflex     Status: None   Collection Time: 09/13/15 12:51 AM  Result Value Ref Range Status   C Diff antigen NEGATIVE NEGATIVE Final   C Diff toxin NEGATIVE NEGATIVE Final   C Diff interpretation Negative for C. difficile  Final  Culture, blood (routine x 2)     Status: None   Collection Time: 09/16/15 11:24 AM  Result Value Ref Range Status   Specimen Description BLOOD LEFT HAND  Final   Special Requests BOTTLES DRAWN AEROBIC AND ANAEROBIC  1CC  Final   Culture NO GROWTH 5 DAYS  Final   Report Status 09/21/2015 FINAL  Final  Culture, blood (routine x 2)     Status: None   Collection Time: 09/16/15 12:23 PM  Result Value Ref Range Status   Specimen Description BLOOD RIGHT HAND  Final   Special Requests BOTTLES DRAWN AEROBIC AND ANAEROBIC  1CC  Final   Culture  Setup Time   Final    GRAM POSITIVE COCCI AEROBIC BOTTLE ONLY CRITICAL RESULT CALLED TO, READ BACK BY AND VERIFIED WITH: TESS THOMAS,RN 09/17/2015 0631 BY JRS.    Culture   Final    ENTEROCOCCUS FAECALIS AEROBIC BOTTLE ONLY VRE HAVE INTRINSIC RESISTANCE TO MOST COMMONLY USED ANTIBIOTICS AND THE ABILITY TO ACQUIRE RESISTANCE TO MOST AVAILABLE ANTIBIOTICS. CRITICAL RESULT CALLED TO, READ BACK BY AND VERIFIED WITH: CHERYL SMITH AT 7829 09/19/15 DV    Report Status 09/21/2015 FINAL  Final   Organism ID, Bacteria ENTEROCOCCUS FAECALIS  Final      Susceptibility   Enterococcus faecalis - MIC*    AMPICILLIN <=2 SENSITIVE Sensitive     LINEZOLID 2 SENSITIVE Sensitive     CIPROFLOXACIN Value in next row Resistant      RESISTANT>=8    TETRACYCLINE Value in next row Resistant      RESISTANT>=16    VANCOMYCIN Value in next row Resistant      RESISTANT>=32    GENTAMICIN SYNERGY Value in next  row Resistant      RESISTANT>=32    * ENTEROCOCCUS FAECALIS  Culture, respiratory (NON-Expectorated)     Status: None   Collection Time: 09/16/15  3:50 PM  Result Value Ref Range Status   Specimen Description TRACHEAL ASPIRATE  Final   Special Requests Immunocompromised  Final   Gram Stain   Final    FEW WBC SEEN GOOD SPECIMEN - 80-90% WBCS RARE GRAM NEGATIVE RODS    Culture LIGHT GROWTH PSEUDOMONAS AERUGINOSA  Final   Report Status 09/19/2015 FINAL  Final   Organism ID, Bacteria PSEUDOMONAS AERUGINOSA  Final      Susceptibility   Pseudomonas aeruginosa - MIC*    CEFTAZIDIME 8 SENSITIVE Sensitive     CIPROFLOXACIN 2 INTERMEDIATE Intermediate     GENTAMICIN >=16 RESISTANT Resistant      IMIPENEM >=16 RESISTANT Resistant     PIP/TAZO Value in next row Sensitive      SENSITIVE32    CEFEPIME Value in next row Sensitive      SENSITIVE8    LEVOFLOXACIN Value in next row Resistant      RESISTANT>=8    * LIGHT GROWTH PSEUDOMONAS AERUGINOSA  Culture, expectorated sputum-assessment     Status: None   Collection Time: 09/22/15  2:09 PM  Result Value Ref Range Status   Specimen Description ENDOTRACHEAL  Final   Special Requests Normal  Final   Sputum evaluation THIS SPECIMEN IS ACCEPTABLE FOR SPUTUM CULTURE  Final   Report Status 09/24/2015 FINAL  Final  Culture, respiratory (NON-Expectorated)     Status: None (Preliminary result)   Collection Time: 09/22/15  2:09 PM  Result Value Ref Range Status   Specimen Description ENDOTRACHEAL  Final   Special Requests Normal Reflexed from [id number]  Final   Gram Stain   Final    FEW WBC SEEN FEW GRAM NEGATIVE RODS POOR SPECIMEN - LESS THAN 70% WBCS    Culture   Final    MODERATE GROWTH PSEUDOMONAS AERUGINOSA SUSCEPTIBILITIES TO FOLLOW    Report Status PENDING  Incomplete  Tissue culture     Status: None (Preliminary result)   Collection Time: 09/24/15  6:44 AM  Result Value Ref Range Status   Specimen Description BONE  Final   Special Requests Normal  Final   Gram Stain MODERATE WBC SEEN FEW GRAM NEGATIVE RODS   Final   Culture   Final    MODERATE GROWTH ESCHERICHIA COLI MODERATE GROWTH KLEBSIELLA PNEUMONIAE ESBL-EXTENDED SPECTRUM BETA LACTAMASE-THE ORGANISM IS RESISTANT TO PENICILLINS, CEPHALOSPORINS AND AZTREONAM ACCORDING TO CLSI M100-S15 VOL.25 N01 JAN 2005. ORGANISM 1 This organism isolate is resistant to one or more antiotic agents in three or more antimicrobial categories.  Suggest Infectious Disease consult.   ORGANISM 2 CRITICAL RESULT CALLED TO, READ BACK BY AND VERIFIED WITH: RN Mardene Celeste [facility] 09/27/15 1005AM    Report Status PENDING  Incomplete   Organism ID, Bacteria ESCHERICHIA COLI  Final   Organism ID, Bacteria  KLEBSIELLA PNEUMONIAE  Final      Susceptibility   Escherichia coli - MIC*    AMPICILLIN >=32 RESISTANT Resistant     CEFTAZIDIME 4 RESISTANT Resistant     CEFAZOLIN >=64 RESISTANT Resistant     CEFTRIAXONE 8 RESISTANT Resistant     GENTAMICIN <=1 SENSITIVE Sensitive     IMIPENEM 8 RESISTANT Resistant     TRIMETH/SULFA <=20 SENSITIVE Sensitive     Extended ESBL POSITIVE Resistant     PIP/TAZO Value in next row Resistant  RESISTANT>=128    * MODERATE GROWTH ESCHERICHIA COLI   Klebsiella pneumoniae - MIC*    AMPICILLIN Value in next row Resistant      RESISTANT>=128    CEFTAZIDIME Value in next row Resistant      RESISTANT>=128    CEFAZOLIN Value in next row Resistant      RESISTANT>=128    CEFTRIAXONE Value in next row Resistant      RESISTANT>=128    CIPROFLOXACIN Value in next row Resistant      RESISTANT>=128    GENTAMICIN Value in next row Resistant      RESISTANT>=128    IMIPENEM Value in next row Resistant      RESISTANT>=128    TRIMETH/SULFA Value in next row Sensitive      RESISTANT>=128    PIP/TAZO Value in next row Resistant      RESISTANT>=128    * MODERATE GROWTH KLEBSIELLA PNEUMONIAE    Medical History: Past Medical History  Diagnosis Date  . Obesity   . Dyslipidemia   . Hypertension   . Coronary artery disease     s/p BMS 2010 LAD  . Dysrhythmia     ventricular tachycardia resolved after LAD stent and beta blocker  . Diabetes mellitus     with retinopathy, neuropathy and microalbuminemia  . ESRD (end stage renal disease) on dialysis     Assessment: 49 yo female admitted on 08/14/2015 with urosepsis and  infected sacral decubitus ulcer. Currently patient is being treated for 1 of 2 positive blood cultures growing VRE from 10/28.  Dr. [name] requested daptomycin be initiated on patient.  She is an MWF HD patient.  Daptomycin 6mg /kg every 48 hours was initiated.   WBC: 12.1 (down from 13), patient remains afebrile. CXR from 11/2 shows new  infiltrate in LLL base. CrCl 55.2, Scr 1.51, patient has been intubated since 09/24/15.  BCx2 should be draw today.   11/08 CK: 10   Plan:  Will continue treatment with daptomycin 500mg  every 48 hours. Pharmacy will continue to monitor labs and renal function and make adjustments as needed. CK today is 10, recommend checking a CPK level at least once a week while on daptomycin.   Courtney [name], PharmD Pharmacy Resident 09/27/15

## 2015-09-27 NOTE — Progress Notes (Signed)
Pt obs with left hand stretching Panda tubing when asked to let go pt looked at tubing and released, while in room with pt, pt stretched tubing x2 more attempts, safety mitten placed on L hand, this RN updated husband

## 2015-09-27 NOTE — Progress Notes (Signed)
CRITICAL VALUE ALERT  Critical value received:  Sacrum bone cx(+ MDOR organism,E choli, ESBL)  Date of notification:  11/8  Time of notification:  1000  Critical value read back:Yes.    Nurse who received alert:  Mardene CelesteJoanna  MD notified (1st page):  Mungal  Time of first page:  1005  MD notified (2nd page):  Time of second page:  Responding MD:  Dema SeverinMungal  Time MD responded: 1008

## 2015-09-27 NOTE — Progress Notes (Signed)
[name] called to give update on pt's bone cx labs,  Husband stated updated already by MD/

## 2015-09-28 LAB — TISSUE CULTURE: Special Requests: NORMAL

## 2015-09-28 LAB — GLUCOSE, CAPILLARY
GLUCOSE-CAPILLARY: 135 mg/dL — AB (ref 65–99)
GLUCOSE-CAPILLARY: 73 mg/dL (ref 65–99)
GLUCOSE-CAPILLARY: 114 mg/dL — AB (ref 65–99)
GLUCOSE-CAPILLARY: 109 mg/dL — AB (ref 65–99)
Glucose-Capillary: 82 mg/dL (ref 65–99)

## 2015-09-28 MED ORDER — LOPERAMIDE HCL 1 MG/5ML PO LIQD
2.0000 mg | Freq: Every day | ORAL | Status: DC | PRN
  Administered 2015-09-28 – 2015-10-03 (×5): 2 mg via ORAL
  Filled 2015-09-28 (×6): qty 10

## 2015-09-28 MED ORDER — ALBUMIN HUMAN 25 % IV SOLN
12.5000 g | Freq: Once | INTRAVENOUS | Status: AC
  Administered 2015-09-28: 12.5 g via INTRAVENOUS
  Filled 2015-09-28: qty 50

## 2015-09-28 MED ORDER — MORPHINE SULFATE (PF) 4 MG/ML IV SOLN
4.0000 mg | INTRAVENOUS | Status: DC | PRN
  Administered 2015-09-28 – 2015-10-22 (×39): 4 mg via INTRAVENOUS
  Administered 2015-10-22: 2 mg via INTRAVENOUS
  Administered 2015-10-27 – 2015-11-21 (×35): 4 mg via INTRAVENOUS
  Filled 2015-09-28 (×80): qty 1

## 2015-09-28 MED ORDER — PANTOPRAZOLE SODIUM 40 MG PO PACK
40.0000 mg | PACK | Freq: Every day | ORAL | Status: DC
Start: 2015-09-29 — End: 2015-12-06
  Administered 2015-09-29 – 2015-12-06 (×69): 40 mg
  Filled 2015-09-28 (×69): qty 20

## 2015-09-28 NOTE — Plan of Care (Signed)
Problem: Phase I Progression Outcomes Goal: Flu/PneumoVaccines if indicated Outcome: Not Met (add Reason) [name] stated he did not want pt to have or pneumonia vaccine while in the hospital.   Problem: Phase II Progression Outcomes Goal: Pain controlled on oral analgesia Outcome: Not Met (add Reason) Increased dosage of morphine today. Discussion of scheduled pain medications.

## 2015-09-28 NOTE — Progress Notes (Signed)
Pre-hd tx 

## 2015-09-28 NOTE — Progress Notes (Signed)
[facility] [place] Critical Care Medicine Progess Note  Name: Courtney [name] MRN: [id number] DOB: Dec 18, 1965    ADMISSION DATE:  08-27-15     INITIAL PRESENTATION:  49 AAF who has been in medical facilities (hosp, LTAC, rehab) for 2 yrs following gastric bypass surgery with multiple complications. Now with chronic trach, ESRD, profound debilitation, severe sacral pressure ulcer. Was seemingly making progress and transferred to rehab facility approx one week prior to this admission. She was sent to [facility] ED with AMS and hypotension. Working dx of severe sepsis/septic shock due to infected sacral pressure ulcer. Since admission. Her course is been very complicated with numerous complications including septic shock and GI bleeding. Currently, she has an NG tube in place, she takes in minimal by mouth nutrition, though she appears to be able to do this, however, she is refusing to eat. Patient with recurrent sepsis  INTERVAL HISTORY: Placed on vasopressors and placed back on vent 11/5, but was able to wean off levophed on 11/6 afternoon.  [name] changed out due to cuff leak on  11/5 PM, #6 Shiley cuffed  The patient was started on Megace and thiamine, on 09/22/2015 per the husband's request.  11/7>>husband at bedside updated on current plan. Plan for HD today, cont with TF, cont with daptomycin, awaiting bone cx and s/s from trach aspirate. Pressure in pilot balloon of trach in the 50s, if cont to be this high, might need to upsize trach.    11/8> no acute events overnight, mild thin secretions noted, was on the vent at 35% FiO2 during the night. Creatinine slightly worse compared to yesterday; had dialysis yesterday, 1 L removed. Sacral Bone culture shows Klebsiella pneumonia and Escherichia coli  11/9> placed on ventilator rate last night, today tolerating dialysis, goal removal is 1 L, we'll attempt trach collar trials today, patient more interactive, will open eyes and nod. Patient is  currently on Septra and daptomycin.  MAJOR EVENTS/TEST RESULTS: Admission 02/07/14-05/07/14 Admission 07/21/14-09/06/14 Discharged to [facility]. Pt had palliative consult at that time, were asked to sign off by husband.  09/23 CT head: NAD 09/23 EEG: no epileptiform activity 09/23 PRBCs for Hgb 6.4 09/24 bedside debridement of sacral wound. Abscess drained 09/25 Off vasopressors. More alert. No distress. Worsening thrombocytopenia. Vanc DC'd 09/29  Dr. Sampson Goon (I.D) excused from the case by patient's husband. 10/02 MRI -multiple infarcts 10/03 tracheal bleeding- transfused platelets 10/03 hospitalist service excused from the case by patient's husband 10/03 Echocardiogram ejection fraction was 55-60%, pulmonary systolic pressure was 39 mmHg 40/98 restart TF's at lower rate, attempt reg HD  10/12 Transferred to med-surg floor. Remains on PCCM service 10/14 SLP eval: pt unable to tolerate PMV adequately 10/17-will re-attempt PM valve-discussed with Speech therapist 10/18 passed swallow eval-start pureed thick foods no thin liquids-continue NG feeds 10/19 transferred to step down for sepsis/aspiration pneumonia 10/19 cxr shows RLL opacity 10/21 sacral decub debride at bedside by surgery 10/22 started back on vasopressors while on HD 10/27 CT with osteo, R hip fx, unable to identify tip of dubhoff tube - sent for fluoro study 10/28 Ortho consultation: I do not feel that she is a surgical candidate. Therefore, I feel that it would best to manage this fracture nonsurgically and allow it to heal by itself over time, which it should.  10/28 Gen Surg consultation: Due to the lack of free air or free flow of contrast and the peritoneum there is no indication for any surgical intervention on this. Would recommend pulling back the feeding  tube 1-2 cm. Would recheck an abdominal film to confirm no pneumoperitoneum in the morning. Absence of any changes okay to continue using Dobbhoff for feeding and  medications. 10/28 gastrograffin study: The study confirms that the feeding tube pes perforated through the duodenum and the tip is within a cavity that fills with injected contrast. 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 Pneumoniae, P. Mirabilis, VRE Wound 9/26 >> No growth Blood 9/23 >> NEG CDiff 9/27>>neg Stool Cx 10/15>> negative Cdiff 10/25>>neg Trach Aspirate 10/28>> light growth pseudomonas Blood 10/28 >> 1/2 GPC >>  Sputum cultures obtained 09/22/15 due to mucus plugging>>Pseudomonas BONE TISSUE Cx 11/3>>E.coli (ESBL), k. Pneumonia (daptomycin and septra)  ANTIMICROBIALS:  Aztreonam 9/23 >> 9/24 Vanc 9/23 >> 9/25 Vanc 9/26>>9/27 Daptomycin 9/27>> 10/12, 10/28>>  Meropenem 9/23 >> 10/14, 10/28 >> 10/31 Septra 11/8>>   VITAL SIGNS: Temp:  [98.8 F (37.1 C)-99.3 F (37.4 C)] 98.9 F (37.2 C) (11/09 1204) Pulse Rate:  [108-122] 120 (11/09 1204) Resp:  [0-30] 12 (11/09 1204) BP: (98-143)/(44-112) 125/82 mmHg (11/09 1204) SpO2:  [94 %-100 %] 100 % (11/09 1204) FiO2 (%):  [24 %-28 %] 28 % (11/09 1136) Weight:  [216 lb 0.8 oz (98 kg)] 216 lb 0.8 oz (98 kg) (11/09 0845) HEMODYNAMICS:   VENTILATOR SETTINGS: Vent Mode:  [-] PRVC FiO2 (%):  [24 %-28 %] 28 % Set Rate:  [14 bmp] 14 bmp Vt  Set:  [500 mL] 500 mL PEEP:  [5 cmH20] 5 cmH20 Pressure Support:  [15 cmH20] 15 cmH20 Plateau Pressure:  [20 cmH20-21 cmH20] 20 cmH20 INTAKE / OUTPUT:  Intake/Output Summary (Last 24 hours) at 09/28/15 1407 Last data filed at 09/28/15 1204  Gross per 24 hour  Intake    470 ml  Output   1000 ml  Net   -530 ml    PHYSICAL EXAMINATION: Physical Examination:   VS: BP 125/82 mmHg  Pulse 120  Temp(Src) 98.9 F (37.2 C) (Axillary)  Resp 12  Ht 5\' 9"  (1.753 m)  Wt 216 lb 0.8 oz (98 kg)  BMI 31.89 kg/m2  SpO2 100%  LMP  (LMP Unknown)  General Appearance: No resp distress, coarse upper airway sounds  Neuro: profoundly diffusely weak HEENT: cushingoid facies, PERRLA, EOM intact Neck: trach site clean Pulmonary: clear anteriorly Cardiovascular: reg, + syst murmur Abdomen: soft, NT, +BS Extremities: warm, R>L UE edema Examination of the sacral decubitus ulcer- showed a very deep ulcer approximately 7-8 cm across.   stage IV ulcer.   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 BMET intermittently Monitor I/Os Correct electrolytes as indicated Intermittent HD per Renal Service (MWF) -plan to wean off vent as tolerated  GASTROINTESTINAL A:  LGIB, resolved Hx of C. Diff Stool incontinence  P:  Cont PPI Resume TFs 10/30 goal now at 50 cc/hr -will not be able to take oral feeds as she is on the  vent  HEMATOLOGIC A:  Acute on chronic anemia S/p Thrombocytopenia - HIT panel negative 9/25, resolved P: DVT px: SCDs + SQ heparin Monitor CBC intermittently Transfuse per usual ICU guidelines  INFECTIOUS A:  Recurrent Severe sepsis, due to sacral decub, currently stable.  Sacral decubitus ulcer with abscess - on Septra and daptomycin ID actively following along, appreciate recs - currently on daptomycin & septra  P:  Monitor temp, WBC count Micro and abx as above  ENDOCRINE A:  -DM 2, controlled. The patient developed some hypoglycemia when the tube feed was stopped and therefore she was started on D10 because she did not take in an adequate amount of calories to maintain her blood glucose levels in the normal range.  -Chronic steroid therapy, for a history of of adrenal insufficiency. P:  Monitor CBGs q 8 hrs - resume SSI if > 180 Cont hydrocortisone @ 10 mg BID Continue  tube feeds. We'll need to monitor blood glucose levels.  MSK A: Right hip fx - small intertrochanteric R fx - pain management. Ortho evaluated and felt not a surgical candidate at this time.   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 Belia Heman and other members of the medical team has meet and discussed prognosis with patient's husband on 09/25/15 - Per Dr. Belia Heman "I recommend Hospice and comfort care measures-patient with evidence of pain and suffering." - Per case manager: "Husband will be speaking with close family members about poor prognosis. He will attempt to obtain responses from patient dealing with her wish for continued care in the presence of bedside nurses." - at this time, continue with current medical care  I have personally obtained a history, examined the patient, evaluated Pertinent laboratory and RadioGraphic/imaging  results, and  formulated the assessment and plan  The Patient requires high complexity decision making for assessment and support, frequent evaluation and titration of therapies, application of advanced monitoring technologies and extensive interpretation of multiple databases. Critical Care Time devoted to patient care services described in this note is 45 minutes.   Overall, patient is critically ill, prognosis is guarded.  Patient with Multiorgan failure and at high risk for cardiac arrest and death. prognosis is very poor, patient with very poor chance of meaningful recovery. Patient with extensive osteomyelitis of the sacrum/coccyx/T8-T9 spine, will need PEG tube for long term.    Stephanie Acre, MD [facility] Pager [phone] (please enter 7-digits) On Call Pager - [phone] (please enter 7-digits)

## 2015-09-28 NOTE — Care Management (Signed)
Cultures from bone biopsy present- Klebsisella and E coli.  Senstive to Bactrim per tube.  Remains on IV Daptomycin. Tube feeding at 50 cc/hr and continues with frequent loose stools.  Patient's husband states that patient has not indicated to him that she wants to discontinue treatment.  Reached out to nursing administration to inquire about ethics consult

## 2015-09-28 NOTE — Progress Notes (Signed)
Subjective:   Seen and examined on hemodialysis. IV albumin being administered to prevent hypotension during treatment.  Daptomycin to be stopped on 11/10 and started on renally dosed Bactrim Tolerating tube feeds.  Now with diarrhea  Off vasopressors and sedation.    Objective:  Vital signs in last 24 hours:  Temp:  [98.8 F (37.1 C)-99.3 F (37.4 C)] 98.9 F (37.2 C) (11/09 0845) Pulse Rate:  [108-122] 114 (11/09 0900) Resp:  [0-30] 0 (11/09 0900) BP: (107-143)/(56-82) 138/78 mmHg (11/09 0845) SpO2:  [97 %-100 %] 100 % (11/09 0900) FiO2 (%):  [24 %] 24 % (11/09 0338) Weight:  [98 kg (216 lb 0.8 oz)] 98 kg (216 lb 0.8 oz) (11/09 0845)  Weight change: 1 kg (2 lb 3.3 oz) Filed Weights   09/27/15 0500 09/28/15 0450 09/28/15 0845  Weight: 97.6 kg (215 lb 2.7 oz) 98 kg (216 lb 0.8 oz) 98 kg (216 lb 0.8 oz)    Intake/Output: I/O last 3 completed shifts: In: 870 [I.V.:20; NG/GT:850] Out: -    Intake/Output this shift:     Physical Exam: General: No acute distress, critically ill  Head: NG in place, eyes open  Eyes: anicteric  Neck: Trach in place  Lungs:  Scattered rhonchi, vent assisted PRVC FiO2 24%  Heart: S1S2 +tachycardia  Abdomen:  Soft, nontender, BS present  Extremities: ++ dependent edema, anasarca  Neurologic: Not waking up off sedation  Access:  R IJ permcath.       Basic Metabolic Panel:  Recent Labs Lab 09/23/15 0503 09/26/15 0428 09/26/15 1100  NA 139  --  139  K 3.7  --  3.5  CL 102  --  103  CO2 33*  --  31  GLUCOSE 222*  --  150*  BUN 35*  --  69*  CREATININE 1.19*  --  1.51*  CALCIUM 7.8*  --  7.8*  PHOS  --  3.1 3.2    Liver Function Tests:  Recent Labs Lab 09/26/15 1100  ALBUMIN 1.7*   No results for input(s): LIPASE, AMYLASE in the last 168 hours. No results for input(s): AMMONIA in the last 168 hours.  CBC:  Recent Labs Lab 09/23/15 0503 09/25/15 1400 09/26/15 0428  WBC 13.6* 13.0* 12.1*  NEUTROABS  --  11.5*  --    HGB 7.3* 7.1* 7.2*  HCT 23.6* 23.4* 22.9*  MCV 105.1* 104.4* 103.4*  PLT 199 133* 141*    Cardiac Enzymes:  Recent Labs Lab 09/27/15 1311  CKTOTAL 10*    BNP: Invalid input(s): POCBNP  CBG:  Recent Labs Lab 09/27/15 1621 09/27/15 2006 09/27/15 2347 09/28/15 0442 09/28/15 0749  GLUCAP 139* 112* 121* 135* 82    Microbiology: Results for orders placed or performed during the hospital encounter of 07/29/2015  Blood Culture (routine x 2)     Status: None   Collection Time: 08/18/2015  8:51 AM  Result Value Ref Range Status   Specimen Description BLOOD [name]  Final   Special Requests BOTTLES DRAWN AEROBIC AND ANAEROBIC  3CC  Final   Culture NO GROWTH 5 DAYS  Final   Report Status 08/17/2015 FINAL  Final  Blood Culture (routine x 2)     Status: None   Collection Time: 08/18/2015  9:20 AM  Result Value Ref Range Status   Specimen Description BLOOD LEFT ARM  Final   Special Requests BOTTLES DRAWN AEROBIC AND ANAEROBIC  1CC  Final   Culture NO GROWTH 5 DAYS  Final   Report  Status 08/17/2015 FINAL  Final  Wound culture     Status: None   Collection Time: Sep 10, 2015  9:20 AM  Result Value Ref Range Status   Specimen Description DECUBITIS  Final   Special Requests Normal  Final   Gram Stain   Final    FEW WBC SEEN MANY GRAM NEGATIVE RODS RARE GRAM POSITIVE COCCI    Culture   Final    HEAVY GROWTH ESCHERICHIA COLI MODERATE GROWTH ENTEROBACTER AEROGENES PROTEUS MIRABILIS HEAVY GROWTH ENTEROCOCCUS SPECIES VRE HAVE INTRINSIC RESISTANCE TO MOST COMMONLY USED ANTIBIOTICS AND THE ABILITY TO ACQUIRE RESISTANCE TO MOST AVAILABLE ANTIBIOTICS.    Report Status 08/16/2015 FINAL  Final   Organism ID, Bacteria ESCHERICHIA COLI  Final   Organism ID, Bacteria ENTEROBACTER AEROGENES  Final   Organism ID, Bacteria PROTEUS MIRABILIS  Final   Organism ID, Bacteria ENTEROCOCCUS SPECIES  Final      Susceptibility   Enterobacter aerogenes - MIC*    CEFTAZIDIME <=1 SENSITIVE Sensitive      CEFAZOLIN >=64 RESISTANT Resistant     CEFTRIAXONE <=1 SENSITIVE Sensitive     CIPROFLOXACIN <=0.25 SENSITIVE Sensitive     GENTAMICIN <=1 SENSITIVE Sensitive     IMIPENEM 1 SENSITIVE Sensitive     TRIMETH/SULFA <=20 SENSITIVE Sensitive     * MODERATE GROWTH ENTEROBACTER AEROGENES   Escherichia coli - MIC*    AMPICILLIN <=2 SENSITIVE Sensitive     CEFTAZIDIME <=1 SENSITIVE Sensitive     CEFAZOLIN <=4 SENSITIVE Sensitive     CEFTRIAXONE <=1 SENSITIVE Sensitive     CIPROFLOXACIN <=0.25 SENSITIVE Sensitive     GENTAMICIN <=1 SENSITIVE Sensitive     IMIPENEM <=0.25 SENSITIVE Sensitive     TRIMETH/SULFA <=20 SENSITIVE Sensitive     * HEAVY GROWTH ESCHERICHIA COLI   Proteus mirabilis - MIC*    AMPICILLIN >=32 RESISTANT Resistant     CEFTAZIDIME <=1 SENSITIVE Sensitive     CEFAZOLIN 8 SENSITIVE Sensitive     CEFTRIAXONE <=1 SENSITIVE Sensitive     CIPROFLOXACIN <=0.25 SENSITIVE Sensitive     GENTAMICIN <=1 SENSITIVE Sensitive     IMIPENEM 1 SENSITIVE Sensitive     TRIMETH/SULFA <=20 SENSITIVE Sensitive     * PROTEUS MIRABILIS   Enterococcus species - MIC*    AMPICILLIN >=32 RESISTANT Resistant     VANCOMYCIN >=32 RESISTANT Resistant     GENTAMICIN SYNERGY SENSITIVE Sensitive     TETRACYCLINE Value in next row Resistant      RESISTANT>=16    * HEAVY GROWTH ENTEROCOCCUS SPECIES  MRSA PCR Screening     Status: None   Collection Time: 2015/09/10  2:38 PM  Result Value Ref Range Status   MRSA by PCR NEGATIVE NEGATIVE Final    Comment:        The GeneXpert MRSA Assay (FDA approved for NASAL specimens only), is one component of a comprehensive MRSA colonization surveillance program. It is not intended to diagnose MRSA infection nor to guide or monitor treatment for MRSA infections.   Blood culture (single)     Status: None   Collection Time: September 10, 2015  3:36 PM  Result Value Ref Range Status   Specimen Description BLOOD RIGHT ASSIST CONTROL  Final   Special Requests BOTTLES DRAWN  AEROBIC AND ANAEROBIC  Final   Culture NO GROWTH 5 DAYS  Final   Report Status 08/17/2015 FINAL  Final  Wound culture     Status: None   Collection Time: 08/13/15 12:37 PM  Result Value Ref  Range Status   Specimen Description WOUND  Final   Special Requests Normal  Final   Gram Stain   Final    FEW WBC SEEN TOO NUMEROUS TO COUNT GRAM NEGATIVE RODS FEW GRAM POSITIVE COCCI    Culture   Final    HEAVY GROWTH ESCHERICHIA COLI MODERATE GROWTH PROTEUS MIRABILIS LIGHT GROWTH KLEBSIELLA PNEUMONIAE MODERATE GROWTH ENTEROCOCCUS GALLINARUM CRITICAL RESULT CALLED TO, READ BACK BY AND VERIFIED WITH: [facility] BORBA AT 1042 08/16/15 DV    Report Status 08/17/2015 FINAL  Final   Organism ID, Bacteria ESCHERICHIA COLI  Final   Organism ID, Bacteria PROTEUS MIRABILIS  Final   Organism ID, Bacteria KLEBSIELLA PNEUMONIAE  Final   Organism ID, Bacteria ENTEROCOCCUS GALLINARUM  Final      Susceptibility   Escherichia coli - MIC*    AMPICILLIN >=32 RESISTANT Resistant     CEFTAZIDIME 4 RESISTANT Resistant     CEFAZOLIN >=64 RESISTANT Resistant     CEFTRIAXONE 16 RESISTANT Resistant     GENTAMICIN 2 SENSITIVE Sensitive     IMIPENEM >=16 RESISTANT Resistant     TRIMETH/SULFA <=20 SENSITIVE Sensitive     Extended ESBL POSITIVE Resistant     PIP/TAZO Value in next row Resistant      RESISTANT>=128    CIPROFLOXACIN Value in next row Sensitive      SENSITIVE<=0.25    * HEAVY GROWTH ESCHERICHIA COLI   Klebsiella pneumoniae - MIC*    AMPICILLIN Value in next row Resistant      SENSITIVE<=0.25    CEFTAZIDIME Value in next row Resistant      SENSITIVE<=0.25    CEFAZOLIN Value in next row Resistant      SENSITIVE<=0.25    CEFTRIAXONE Value in next row Resistant      SENSITIVE<=0.25    CIPROFLOXACIN Value in next row Resistant      SENSITIVE<=0.25    GENTAMICIN Value in next row Sensitive      SENSITIVE<=0.25    IMIPENEM Value in next row Resistant      SENSITIVE<=0.25    TRIMETH/SULFA Value in  next row Resistant      SENSITIVE<=0.25    PIP/TAZO Value in next row Resistant      RESISTANT>=128    * LIGHT GROWTH KLEBSIELLA PNEUMONIAE   Proteus mirabilis - MIC*    AMPICILLIN Value in next row Resistant      RESISTANT>=128    CEFTAZIDIME Value in next row Sensitive      RESISTANT>=128    CEFAZOLIN Value in next row Sensitive      RESISTANT>=128    CEFTRIAXONE Value in next row Sensitive      RESISTANT>=128    CIPROFLOXACIN Value in next row Sensitive      RESISTANT>=128    GENTAMICIN Value in next row Sensitive      RESISTANT>=128    IMIPENEM Value in next row Sensitive      RESISTANT>=128    TRIMETH/SULFA Value in next row Sensitive      RESISTANT>=128    PIP/TAZO Value in next row Sensitive      SENSITIVE<=4    * MODERATE GROWTH PROTEUS MIRABILIS   Enterococcus gallinarum - MIC*    AMPICILLIN Value in next row Resistant      SENSITIVE<=4    GENTAMICIN SYNERGY Value in next row Sensitive      SENSITIVE<=4    CIPROFLOXACIN Value in next row Resistant      RESISTANT>=8    TETRACYCLINE Value in next row  Resistant      RESISTANT>=16    * MODERATE GROWTH ENTEROCOCCUS GALLINARUM  Wound culture     Status: None   Collection Time: 08/15/15  2:53 PM  Result Value Ref Range Status   Specimen Description WOUND  Final   Special Requests NONE  Final   Gram Stain FEW WBC SEEN NO ORGANISMS SEEN   Final   Culture NO GROWTH 3 DAYS  Final   Report Status 08/18/2015 FINAL  Final  C difficile quick scan w PCR reflex     Status: None   Collection Time: 08/17/15 11:34 AM  Result Value Ref Range Status   C Diff antigen NEGATIVE NEGATIVE Final   C Diff toxin NEGATIVE NEGATIVE Final   C Diff interpretation Negative for C. difficile  Final  Stool culture     Status: None   Collection Time: 09/03/15  3:51 PM  Result Value Ref Range Status   Specimen Description STOOL  Final   Special Requests Immunocompromised  Final   Culture   Final    NO SALMONELLA OR SHIGELLA ISOLATED No  Pathogenic E. coli detected NO CAMPYLOBACTER DETECTED    Report Status 09/07/2015 FINAL  Final  C difficile quick scan w PCR reflex     Status: None   Collection Time: 09/13/15 12:51 AM  Result Value Ref Range Status   C Diff antigen NEGATIVE NEGATIVE Final   C Diff toxin NEGATIVE NEGATIVE Final   C Diff interpretation Negative for C. difficile  Final  Culture, blood (routine x 2)     Status: None   Collection Time: 09/16/15 11:24 AM  Result Value Ref Range Status   Specimen Description BLOOD LEFT HAND  Final   Special Requests BOTTLES DRAWN AEROBIC AND ANAEROBIC  1CC  Final   Culture NO GROWTH 5 DAYS  Final   Report Status 09/21/2015 FINAL  Final  Culture, blood (routine x 2)     Status: None   Collection Time: 09/16/15 12:23 PM  Result Value Ref Range Status   Specimen Description BLOOD RIGHT HAND  Final   Special Requests BOTTLES DRAWN AEROBIC AND ANAEROBIC  1CC  Final   Culture  Setup Time   Final    GRAM POSITIVE COCCI AEROBIC BOTTLE ONLY CRITICAL RESULT CALLED TO, READ BACK BY AND VERIFIED WITH: TESS THOMAS,RN 09/17/2015 0631 BY JRS.    Culture   Final    ENTEROCOCCUS FAECALIS AEROBIC BOTTLE ONLY VRE HAVE INTRINSIC RESISTANCE TO MOST COMMONLY USED ANTIBIOTICS AND THE ABILITY TO ACQUIRE RESISTANCE TO MOST AVAILABLE ANTIBIOTICS. CRITICAL RESULT CALLED TO, READ BACK BY AND VERIFIED WITH: CHERYL SMITH AT 4098 09/19/15 DV    Report Status 09/21/2015 FINAL  Final   Organism ID, Bacteria ENTEROCOCCUS FAECALIS  Final      Susceptibility   Enterococcus faecalis - MIC*    AMPICILLIN <=2 SENSITIVE Sensitive     LINEZOLID 2 SENSITIVE Sensitive     CIPROFLOXACIN Value in next row Resistant      RESISTANT>=8    TETRACYCLINE Value in next row Resistant      RESISTANT>=16    VANCOMYCIN Value in next row Resistant      RESISTANT>=32    GENTAMICIN SYNERGY Value in next row Resistant      RESISTANT>=32    * ENTEROCOCCUS FAECALIS  Culture, respiratory (NON-Expectorated)     Status:  None   Collection Time: 09/16/15  3:50 PM  Result Value Ref Range Status   Specimen Description TRACHEAL ASPIRATE  Final  Special Requests Immunocompromised  Final   Gram Stain   Final    FEW WBC SEEN GOOD SPECIMEN - 80-90% WBCS RARE GRAM NEGATIVE RODS    Culture LIGHT GROWTH PSEUDOMONAS AERUGINOSA  Final   Report Status 09/19/2015 FINAL  Final   Organism ID, Bacteria PSEUDOMONAS AERUGINOSA  Final      Susceptibility   Pseudomonas aeruginosa - MIC*    CEFTAZIDIME 8 SENSITIVE Sensitive     CIPROFLOXACIN 2 INTERMEDIATE Intermediate     GENTAMICIN >=16 RESISTANT Resistant     IMIPENEM >=16 RESISTANT Resistant     PIP/TAZO Value in next row Sensitive      SENSITIVE32    CEFEPIME Value in next row Sensitive      SENSITIVE8    LEVOFLOXACIN Value in next row Resistant      RESISTANT>=8    * LIGHT GROWTH PSEUDOMONAS AERUGINOSA  Culture, expectorated sputum-assessment     Status: None   Collection Time: 09/22/15  2:09 PM  Result Value Ref Range Status   Specimen Description ENDOTRACHEAL  Final   Special Requests Normal  Final   Sputum evaluation THIS SPECIMEN IS ACCEPTABLE FOR SPUTUM CULTURE  Final   Report Status 09/24/2015 FINAL  Final  Culture, respiratory (NON-Expectorated)     Status: None (Preliminary result)   Collection Time: 09/22/15  2:09 PM  Result Value Ref Range Status   Specimen Description ENDOTRACHEAL  Final   Special Requests Normal Reflexed from [id number]  Final   Gram Stain   Final    FEW WBC SEEN FEW GRAM NEGATIVE RODS POOR SPECIMEN - LESS THAN 70% WBCS    Culture   Final    MODERATE GROWTH PSEUDOMONAS AERUGINOSA SUSCEPTIBILITIES TO FOLLOW    Report Status PENDING  Incomplete  Tissue culture     Status: None (Preliminary result)   Collection Time: 09/24/15  6:44 AM  Result Value Ref Range Status   Specimen Description BONE  Final   Special Requests Normal  Final   Gram Stain MODERATE WBC SEEN FEW GRAM NEGATIVE RODS   Final   Culture   Final     MODERATE GROWTH ESCHERICHIA COLI MODERATE GROWTH KLEBSIELLA PNEUMONIAE ESBL-EXTENDED SPECTRUM BETA LACTAMASE-THE ORGANISM IS RESISTANT TO PENICILLINS, CEPHALOSPORINS AND AZTREONAM ACCORDING TO CLSI [id number] VOL.25 N01 JAN 2005. ORGANISM 1 This organism isolate is resistant to one or more antiotic agents in three or more antimicrobial categories.  Suggest Infectious Disease consult.   ORGANISM 2 CRITICAL RESULT CALLED TO, READ BACK BY AND VERIFIED WITH: RN Mardene Celeste [facility] 09/27/15 1005AM    Report Status PENDING  Incomplete   Organism ID, Bacteria ESCHERICHIA COLI  Final   Organism ID, Bacteria KLEBSIELLA PNEUMONIAE  Final      Susceptibility   Escherichia coli - MIC*    AMPICILLIN >=32 RESISTANT Resistant     CEFTAZIDIME 4 RESISTANT Resistant     CEFAZOLIN >=64 RESISTANT Resistant     CEFTRIAXONE 8 RESISTANT Resistant     GENTAMICIN <=1 SENSITIVE Sensitive     IMIPENEM 8 RESISTANT Resistant     TRIMETH/SULFA <=20 SENSITIVE Sensitive     Extended ESBL POSITIVE Resistant     PIP/TAZO Value in next row Resistant      RESISTANT>=128    * MODERATE GROWTH ESCHERICHIA COLI   Klebsiella pneumoniae - MIC*    AMPICILLIN Value in next row Resistant      RESISTANT>=128    CEFTAZIDIME Value in next row Resistant      RESISTANT>=128  CEFAZOLIN Value in next row Resistant      RESISTANT>=128    CEFTRIAXONE Value in next row Resistant      RESISTANT>=128    CIPROFLOXACIN Value in next row Resistant      RESISTANT>=128    GENTAMICIN Value in next row Resistant      RESISTANT>=128    IMIPENEM Value in next row Resistant      RESISTANT>=128    TRIMETH/SULFA Value in next row Sensitive      RESISTANT>=128    PIP/TAZO Value in next row Resistant      RESISTANT>=128    * MODERATE GROWTH KLEBSIELLA PNEUMONIAE  Anaerobic culture     Status: None (Preliminary result)   Collection Time: 09/24/15  3:55 PM  Result Value Ref Range Status   Specimen Description BONE  Final   Special Requests Normal   Final   Culture PENDING  Incomplete   Report Status PENDING  Incomplete    Coagulation Studies: No results for input(s): LABPROT, INR in the last 72 hours.  Urinalysis: No results for input(s): COLORURINE, LABSPEC, PHURINE, GLUCOSEU, HGBUR, BILIRUBINUR, KETONESUR, PROTEINUR, UROBILINOGEN, NITRITE, LEUKOCYTESUR in the last 72 hours.  Invalid input(s): APPERANCEUR    Imaging: No results found.   Medications:   . feeding supplement (VITAL AF 1.2 CAL) 1,000 mL (09/27/15 1600)   . albumin human  12.5 g Intravenous Once  . antiseptic oral rinse  7 mL Mouth Rinse q12n4p  . chlorhexidine  15 mL Mouth Rinse BID  . DAPTOmycin (CUBICIN)  IV  500 mg Intravenous Q48H  . epoetin (EPOGEN/PROCRIT) injection  10,000 Units Intravenous Q M,W,F-HD  . feeding supplement (PRO-STAT SUGAR FREE 64)  30 mL Per Tube 6 times per day  . folic acid  1 mg Oral Daily  . free water  20 mL Per Tube 6 times per day  . heparin subcutaneous  5,000 Units Subcutaneous Q12H  . hydrocortisone  10 mg Per Tube BID  . insulin aspart  0-9 Units Subcutaneous 6 times per day  . ipratropium-albuterol  3 mL Nebulization QID  . lidocaine  1 patch Transdermal Q24H  . megestrol  400 mg Oral BID  . midodrine  5 mg Per Tube TID WC  . multivitamin  5 mL Oral Daily  . nystatin   Topical TID  . pantoprazole (PROTONIX) IV  40 mg Intravenous Q24H  . sodium chloride  10-40 mL Intracatheter Q12H  . sodium hypochlorite   Irrigation BID  . sulfamethoxazole-trimethoprim  2.5 tablet Per Tube q1800  . thiamine IV  100 mg Intravenous Daily   sodium chloride, sodium chloride, acetaminophen (TYLENOL) oral liquid 160 mg/5 mL, HYDROmorphone (DILAUDID) injection, morphine injection, oxyCODONE, sodium chloride  Assessment/ Plan:  49 y.o. 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 Admitted on Jul 02, 2015  1. End-stage renal disease on hemodialysis on HD MWF. The patient has been on dialysis since October of 2014. R IJ permcath.  Tolerated dialysis yesterday with IV albumin for oncotic support - Plan on continuing hemodialysis on MWF schedule.  -  midodine and albumin with treatment.  Also getting scheduled hydrocortisone.  -  UF goal of 1 litre  2. Anemia of CKD: pt continues to require periodic blood transfusions, hemoglobin of 7.2 from 11/7  continue epogen with HD. This has been ordered as a standing order.  Multiple transfusions during hospitalization  3. Secondary hyperparathyroidism: PTH low at 55 - phos was acceptable at 3.3 - no binders  4. Sepsis: VRE in blood - Treatment as per ID and ICU team: daptomycin to be completed on 11/10 and now started on trimethoprim/sulfamethoxazole.   5. Generalized Dependent edema/Anasarca due to 3rd spacing - UF with dialysis with albumin IV and midodrine , as tolerated  6. Acute resp failure - back on vent as of AM 09/25/15 - Appreciate pulmonary input.    LOS: 47 Tyee Vandevoorde 11/9/20169:07 AM

## 2015-09-28 NOTE — Progress Notes (Signed)
Post hd tx 

## 2015-09-28 NOTE — Progress Notes (Signed)
ANTIBIOTIC CONSULT NOTE - Follow Up  Pharmacy Consult for daptomycin  Indication: Bacteremia   Allergies  Allergen Reactions  . Contrast Media [Iodinated Diagnostic Agents] Anaphylaxis  . Ampicillin Rash    Patient Measurements: Height:  (175.3 cm) Weight: 216 lb 0.8 oz (98 kg) IBW/kg (Calculated) : 66.2  Vital Signs: Temp: 98.9 F (37.2 C) (11/09 0845) Temp Source: Axillary (11/09 0845) BP: 125/82 mmHg (11/09 1204) Pulse Rate: 120 (11/09 1204) Intake/Output from previous day: 11/08 0701 - 11/09 0700 In: 460 [I.V.:10; NG/GT:450] Out: -   Labs:  Recent Labs  09/25/15 1400 09/26/15 0428 09/26/15 1100  WBC 13.0* 12.1*  --   HGB 7.1* 7.2*  --   PLT 133* 141*  --   CREATININE  --   --  1.51*   Estimated Creatinine Clearance: 55.5 mL/min (by C-G formula based on Cr of 1.51). No results for input(s): VANCOTROUGH, VANCOPEAK, VANCORANDOM, GENTTROUGH, GENTPEAK, GENTRANDOM, TOBRATROUGH, TOBRAPEAK, TOBRARND, AMIKACINPEAK, AMIKACINTROU, AMIKACIN in the last 72 hours.   Microbiology: Recent Results (from the past 720 hour(s))  Stool culture     Status: None   Collection Time: 09/03/15  3:51 PM  Result Value Ref Range Status   Specimen Description STOOL  Final   Special Requests Immunocompromised  Final   Culture   Final    NO SALMONELLA OR SHIGELLA ISOLATED No Pathogenic E. coli detected NO CAMPYLOBACTER DETECTED    Report Status 09/07/2015 FINAL  Final  C difficile quick scan w PCR reflex     Status: None   Collection Time: 09/13/15 12:51 AM  Result Value Ref Range Status   C Diff antigen NEGATIVE NEGATIVE Final   C Diff toxin NEGATIVE NEGATIVE Final   C Diff interpretation Negative for C. difficile  Final  Culture, blood (routine x 2)     Status: None   Collection Time: 09/16/15 11:24 AM  Result Value Ref Range Status   Specimen Description BLOOD LEFT HAND  Final   Special Requests BOTTLES DRAWN AEROBIC AND ANAEROBIC  1CC  Final   Culture NO GROWTH 5 DAYS   Final   Report Status 09/21/2015 FINAL  Final  Culture, blood (routine x 2)     Status: None   Collection Time: 09/16/15 12:23 PM  Result Value Ref Range Status   Specimen Description BLOOD RIGHT HAND  Final   Special Requests BOTTLES DRAWN AEROBIC AND ANAEROBIC  1CC  Final   Culture  Setup Time   Final    GRAM POSITIVE COCCI AEROBIC BOTTLE ONLY CRITICAL RESULT CALLED TO, READ BACK BY AND VERIFIED WITH: TESS THOMAS,RN 09/17/2015 0631 BY JRS.    Culture   Final    ENTEROCOCCUS FAECALIS AEROBIC BOTTLE ONLY VRE HAVE INTRINSIC RESISTANCE TO MOST COMMONLY USED ANTIBIOTICS AND THE ABILITY TO ACQUIRE RESISTANCE TO MOST AVAILABLE ANTIBIOTICS. CRITICAL RESULT CALLED TO, READ BACK BY AND VERIFIED WITH: CHERYL SMITH AT 1610 09/19/15 DV    Report Status 09/21/2015 FINAL  Final   Organism ID, Bacteria ENTEROCOCCUS FAECALIS  Final      Susceptibility   Enterococcus faecalis - MIC*    AMPICILLIN <=2 SENSITIVE Sensitive     LINEZOLID 2 SENSITIVE Sensitive     CIPROFLOXACIN Value in next row Resistant      RESISTANT>=8    TETRACYCLINE Value in next row Resistant      RESISTANT>=16    VANCOMYCIN Value in next row Resistant      RESISTANT>=32    GENTAMICIN SYNERGY Value in next row  Resistant      RESISTANT>=32    * ENTEROCOCCUS FAECALIS  Culture, respiratory (NON-Expectorated)     Status: None   Collection Time: 09/16/15  3:50 PM  Result Value Ref Range Status   Specimen Description TRACHEAL ASPIRATE  Final   Special Requests Immunocompromised  Final   Gram Stain   Final    FEW WBC SEEN GOOD SPECIMEN - 80-90% WBCS RARE GRAM NEGATIVE RODS    Culture LIGHT GROWTH PSEUDOMONAS AERUGINOSA  Final   Report Status 09/19/2015 FINAL  Final   Organism ID, Bacteria PSEUDOMONAS AERUGINOSA  Final      Susceptibility   Pseudomonas aeruginosa - MIC*    CEFTAZIDIME 8 SENSITIVE Sensitive     CIPROFLOXACIN 2 INTERMEDIATE Intermediate     GENTAMICIN >=16 RESISTANT Resistant     IMIPENEM >=16 RESISTANT  Resistant     PIP/TAZO Value in next row Sensitive      SENSITIVE32    CEFEPIME Value in next row Sensitive      SENSITIVE8    LEVOFLOXACIN Value in next row Resistant      RESISTANT>=8    * LIGHT GROWTH PSEUDOMONAS AERUGINOSA  Culture, expectorated sputum-assessment     Status: None   Collection Time: 09/22/15  2:09 PM  Result Value Ref Range Status   Specimen Description ENDOTRACHEAL  Final   Special Requests Normal  Final   Sputum evaluation THIS SPECIMEN IS ACCEPTABLE FOR SPUTUM CULTURE  Final   Report Status 09/24/2015 FINAL  Final  Culture, respiratory (NON-Expectorated)     Status: None (Preliminary result)   Collection Time: 09/22/15  2:09 PM  Result Value Ref Range Status   Specimen Description ENDOTRACHEAL  Final   Special Requests Normal Reflexed from [id number]  Final   Gram Stain   Final    FEW WBC SEEN FEW GRAM NEGATIVE RODS POOR SPECIMEN - LESS THAN 70% WBCS    Culture   Final    MODERATE GROWTH PSEUDOMONAS AERUGINOSA SUSCEPTIBILITIES TO FOLLOW    Report Status PENDING  Incomplete  Tissue culture     Status: None   Collection Time: 09/24/15  6:44 AM  Result Value Ref Range Status   Specimen Description BONE  Final   Special Requests Normal  Final   Gram Stain MODERATE WBC SEEN FEW GRAM NEGATIVE RODS   Final   Culture   Final    MODERATE GROWTH ESCHERICHIA COLI MODERATE GROWTH KLEBSIELLA PNEUMONIAE ESBL-EXTENDED SPECTRUM BETA LACTAMASE-THE ORGANISM IS RESISTANT TO PENICILLINS, CEPHALOSPORINS AND AZTREONAM ACCORDING TO CLSI M100-S15 VOL.25 N01 JAN 2005. ORGANISM 1 This organism isolate is resistant to one or more antiotic agents in three or more antimicrobial categories.  Suggest Infectious Disease consult.   ORGANISM 2 CRITICAL RESULT CALLED TO, READ BACK BY AND VERIFIED WITH: RN Mardene Celeste LINDSAY 09/27/15 1005AM    Report Status 09/28/2015 FINAL  Final   Organism ID, Bacteria ESCHERICHIA COLI  Final   Organism ID, Bacteria KLEBSIELLA PNEUMONIAE  Final       Susceptibility   Escherichia coli - MIC*    AMPICILLIN >=32 RESISTANT Resistant     CEFTAZIDIME 4 RESISTANT Resistant     CEFAZOLIN >=64 RESISTANT Resistant     CEFTRIAXONE 8 RESISTANT Resistant     GENTAMICIN <=1 SENSITIVE Sensitive     IMIPENEM 8 RESISTANT Resistant     TRIMETH/SULFA <=20 SENSITIVE Sensitive     Extended ESBL POSITIVE Resistant     PIP/TAZO Value in next row Resistant      RESISTANT>=128    *  MODERATE GROWTH ESCHERICHIA COLI   Klebsiella pneumoniae - MIC*    AMPICILLIN Value in next row Resistant      RESISTANT>=128    CEFTAZIDIME Value in next row Resistant      RESISTANT>=128    CEFAZOLIN Value in next row Resistant      RESISTANT>=128    CEFTRIAXONE Value in next row Resistant      RESISTANT>=128    CIPROFLOXACIN Value in next row Resistant      RESISTANT>=128    GENTAMICIN Value in next row Resistant      RESISTANT>=128    IMIPENEM Value in next row Resistant      RESISTANT>=128    TRIMETH/SULFA Value in next row Sensitive      RESISTANT>=128    PIP/TAZO Value in next row Resistant      RESISTANT>=128    * MODERATE GROWTH KLEBSIELLA PNEUMONIAE  Anaerobic culture     Status: None (Preliminary result)   Collection Time: 09/24/15  3:55 PM  Result Value Ref Range Status   Specimen Description BONE  Final   Special Requests Normal  Final   Culture PENDING  Incomplete   Report Status PENDING  Incomplete    Medical History: Past Medical History  Diagnosis Date  . Obesity   . Dyslipidemia   . Hypertension   . Coronary artery disease     s/p BMS 2010 LAD  . Dysrhythmia     ventricular tachycardia resolved after LAD stent and beta blocker  . Diabetes mellitus     with retinopathy, neuropathy and microalbuminemia  . ESRD (end stage renal disease) on dialysis     Assessment: 49 yo female admitted on 08/01/2015 with urosepsis and  infected sacral decubitus ulcer. Currently patient is being treated for 1 of 2 positive blood cultures growing VRE from  10/28.  Dr. [name] requested daptomycin be initiated on patient.  She is an MWF HD patient.  Daptomycin 6mg /kg every 48 hours was initiated.   WBC: 12.1 (down from 13), patient remains afebrile. CXR from 11/2 shows new infiltrate in LLL base. CrCl 55.2, Scr 1.51, patient has been intubated since 09/24/15.  BCx2 pending.   11/08 CK: 10   Plan:  Will continue treatment with daptomycin 500mg  every 48 hours. Pharmacy will continue to monitor labs and renal function and make adjustments as needed.   According to Dr. Jarrett AblesFitzgerald's note Daptomycin will be discontinued 11/10.  Cher NakaiSheema Khristine Verno, PharmD Pharmacy Resident 09/27/15

## 2015-09-28 NOTE — Progress Notes (Signed)
Nutrition Follow-up  DOCUMENTATION CODES:   Severe malnutrition in context of acute illness/injury  INTERVENTION:   EN: recommend continuing current TF regimen while pt on trach collar trials; once stable off vent, recommend titrating to goal rate of 65 ml/hr as tolerated by pt. Continue Prostat q 4 hours, minimal free water flushes  NUTRITION DIAGNOSIS:   Inadequate oral intake related to wound healing, acute illness, chronic illness as evidenced by NPO status, estimated needs.  GOAL:   Patient will meet greater than or equal to 90% of their needs  MONITOR:    (Energy Intake, Anthropometrics, Digestive System, Electrolyte/Renal Profile, Glucose Profile)  REASON FOR ASSESSMENT:   Consult Enteral/tube feeding initiation and management  ASSESSMENT:    Trach collar trials as able, otherwise on vent via trach at present. Receiving HD this AM. Noted antibiotic regimen adjusted s/p bone cultures. Pain medications being adjust as well today; RN reports pt tearful, in pain  EN: tolerating Vital 1.2 AF at rate of 50 ml/hr with Prostat 6 packets per day  Digestive System:    persistent loose stool continues, more frequent per documentation; no other signs of TF intolerance  Skin:   (stage IV sacrum, stage III hip, unstageable foot; rectal ulcer from fleixseal, ulcer in nose from dobhoff)  Protein Profile:  Recent Labs Lab 09/26/15 1100  ALBUMIN 1.7*   Electrolyte and Renal Profile:  Recent Labs Lab 09/23/15 0503 09/26/15 0428 09/26/15 1100  BUN 35*  --  69*  CREATININE 1.19*  --  1.51*  NA 139  --  139  K 3.7  --  3.5  PHOS  --  3.1 3.2   Nutritional Anemia Profile:  CBC Latest Ref Rng 09/26/2015 09/25/2015 09/23/2015  WBC 3.6 - 11.0 K/uL 12.1(H) 13.0(H) 13.6(H)  Hemoglobin 12.0 - 16.0 g/dL 7.2(L) 7.1(L) 7.3(L)  Hematocrit 35.0 - 47.0 % 22.9(L) 23.4(L) 23.6(L)  Platelets 150 - 440 K/uL 141(L) 133(L) 199    Glucose Profile:  Recent Labs  09/28/15 0442  09/28/15 0749 09/28/15 1123  GLUCAP 135* 82 73   Meds: folic acid, dilaudid, ss novolog, imodium prn daily started today, midodrine, megace per husband's request, morphine, oxycodone  Height:   Ht Readings from Last 1 Encounters:  2015-06-05 5\' 9"  (1.753 m)    Weight:   Wt Readings from Last 1 Encounters:  09/28/15 216 lb 0.8 oz (98 kg)    Ideal Body Weight:     BMI:  Body mass index is 31.89 kg/(m^2).  Estimated Nutritional Needs:   Kcal:  2175-2535 kcals (BEE 1509, 1.2 AF, 1.2-1.4 IF) or 2490-2905 kcals (30-35 kcals/kg)   Protein:  125-166 g (1.5-2.0 g/kg) but likely closer to 166-208 g (2.0-2.5 g/kg)   Fluid:  1000 mL plus UOP  EDUCATION NEEDS:   No education needs identified at this time  HIGH Care Level  Romelle Starcherate Rynlee Lisbon MS, RD, LDN [phone] Pager

## 2015-09-28 NOTE — Progress Notes (Signed)
HD tx start 

## 2015-09-28 NOTE — Progress Notes (Signed)
HD tx completed.

## 2015-09-29 ENCOUNTER — Inpatient Hospital Stay: Payer: 59

## 2015-09-29 LAB — BASIC METABOLIC PANEL
ANION GAP: 4 — AB (ref 5–15)
BUN: 48 mg/dL — ABNORMAL HIGH (ref 6–20)
CALCIUM: 8 mg/dL — AB (ref 8.9–10.3)
CO2: 33 mmol/L — ABNORMAL HIGH (ref 22–32)
CREATININE: 0.99 mg/dL (ref 0.44–1.00)
Chloride: 104 mmol/L (ref 101–111)
GLUCOSE: 139 mg/dL — AB (ref 65–99)
Potassium: 3.3 mmol/L — ABNORMAL LOW (ref 3.5–5.1)
Sodium: 141 mmol/L (ref 135–145)

## 2015-09-29 LAB — BLOOD GAS, ARTERIAL
ACID-BASE EXCESS: 10.9 mmol/L — AB (ref 0.0–3.0)
Allens test (pass/fail): POSITIVE — AB
Bicarbonate: 35.7 mEq/L — ABNORMAL HIGH (ref 21.0–28.0)
FIO2: 0.3
O2 Saturation: 98.1 %
PEEP: 5 cmH2O
PH ART: 7.47 — AB (ref 7.350–7.450)
Patient temperature: 37
RATE: 15 resp/min
VT: 500 mL
pCO2 arterial: 49 mmHg — ABNORMAL HIGH (ref 32.0–48.0)
pO2, Arterial: 99 mmHg (ref 83.0–108.0)

## 2015-09-29 LAB — GLUCOSE, CAPILLARY
GLUCOSE-CAPILLARY: 125 mg/dL — AB (ref 65–99)
GLUCOSE-CAPILLARY: 132 mg/dL — AB (ref 65–99)
GLUCOSE-CAPILLARY: 113 mg/dL — AB (ref 65–99)
GLUCOSE-CAPILLARY: 123 mg/dL — AB (ref 65–99)
Glucose-Capillary: 120 mg/dL — ABNORMAL HIGH (ref 65–99)
Glucose-Capillary: 124 mg/dL — ABNORMAL HIGH (ref 65–99)
Glucose-Capillary: 120 mg/dL — ABNORMAL HIGH (ref 65–99)

## 2015-09-29 LAB — CBC
HEMATOCRIT: 22.5 % — AB (ref 35.0–47.0)
HEMOGLOBIN: 6.9 g/dL — AB (ref 12.0–16.0)
MCH: 32.3 pg (ref 26.0–34.0)
MCHC: 30.8 g/dL — AB (ref 32.0–36.0)
MCV: 104.6 fL — ABNORMAL HIGH (ref 80.0–100.0)
PLATELETS: 128 10*3/uL — AB (ref 150–440)
RBC: 2.15 MIL/uL — AB (ref 3.80–5.20)
RDW: 19.9 % — AB (ref 11.5–14.5)
WBC: 9.1 10*3/uL (ref 3.6–11.0)

## 2015-09-29 LAB — ANAEROBIC CULTURE: Special Requests: NORMAL

## 2015-09-29 LAB — PREPARE RBC (CROSSMATCH)

## 2015-09-29 MED ORDER — SODIUM CHLORIDE 0.9 % IV SOLN
Freq: Once | INTRAVENOUS | Status: AC
  Administered 2015-09-29: 18:00:00 via INTRAVENOUS

## 2015-09-29 MED ORDER — POTASSIUM CHLORIDE CRYS ER 20 MEQ PO TBCR
40.0000 meq | EXTENDED_RELEASE_TABLET | Freq: Once | ORAL | Status: AC
  Administered 2015-09-29: 40 meq via ORAL
  Filled 2015-09-29: qty 2

## 2015-09-29 NOTE — Progress Notes (Signed)
Subjective:   Hemodialysis yesterday. UF of 1 litre. IV albumin given.  Off sedation. Not responding to stimuli  No labs ?    Objective:  Vital signs in last 24 hours:  Temp:  [98 F (36.7 C)-98.9 F (37.2 C)] 98.4 F (36.9 C) (11/10 0730) Pulse Rate:  [102-123] 102 (11/10 0730) Resp:  [0-31] 29 (11/10 0730) BP: (98-139)/(44-112) 115/68 mmHg (11/10 0730) SpO2:  [94 %-100 %] 100 % (11/10 0730) FiO2 (%):  [24 %-40 %] 30 % (11/10 0438)  Weight change: 0 kg (0 lb) Filed Weights   09/27/15 0500 09/28/15 0450 09/28/15 0845  Weight: 97.6 kg (215 lb 2.7 oz) 98 kg (216 lb 0.8 oz) 98 kg (216 lb 0.8 oz)    Intake/Output: I/O last 3 completed shifts: In: 2046.7 [I.V.:20; NG/GT:1916.7; IV Piggyback:110] Out: 1002 [Other:1000; Stool:2]   Intake/Output this shift:     Physical Exam: General: No acute distress, critically ill  Head: NG in place, eyes open  Eyes: anicteric  Neck: Trach in place  Lungs:  Scattered rhonchi, vent assisted PRVC FiO2 24%  Heart: S1S2 +tachycardia  Abdomen:  Soft, nontender, BS present  Extremities: ++ dependent edema, anasarca  Neurologic: Not waking up off sedation  Access:  R IJ permcath.       Basic Metabolic Panel:  Recent Labs Lab 09/23/15 0503 09/26/15 0428 09/26/15 1100  NA 139  --  139  K 3.7  --  3.5  CL 102  --  103  CO2 33*  --  31  GLUCOSE 222*  --  150*  BUN 35*  --  69*  CREATININE 1.19*  --  1.51*  CALCIUM 7.8*  --  7.8*  PHOS  --  3.1 3.2    Liver Function Tests:  Recent Labs Lab 09/26/15 1100  ALBUMIN 1.7*   No results for input(s): LIPASE, AMYLASE in the last 168 hours. No results for input(s): AMMONIA in the last 168 hours.  CBC:  Recent Labs Lab 09/23/15 0503 09/25/15 1400 09/26/15 0428  WBC 13.6* 13.0* 12.1*  NEUTROABS  --  11.5*  --   HGB 7.3* 7.1* 7.2*  HCT 23.6* 23.4* 22.9*  MCV 105.1* 104.4* 103.4*  PLT 199 133* 141*    Cardiac Enzymes:  Recent Labs Lab 09/27/15 1311  CKTOTAL 10*     BNP: Invalid input(s): POCBNP  CBG:  Recent Labs Lab 09/28/15 1650 09/28/15 1957 09/29/15 0034 09/29/15 0441 09/29/15 0735  GLUCAP 114* 109* 120* 124* 125*    Microbiology: Results for orders placed or performed during the hospital encounter of August 28, 2015  Blood Culture (routine x 2)     Status: None   Collection Time: Aug 28, 2015  8:51 AM  Result Value Ref Range Status   Specimen Description BLOOD [name]  Final   Special Requests BOTTLES DRAWN AEROBIC AND ANAEROBIC  3CC  Final   Culture NO GROWTH 5 DAYS  Final   Report Status 08/17/2015 FINAL  Final  Blood Culture (routine x 2)     Status: None   Collection Time: 2015/08/28  9:20 AM  Result Value Ref Range Status   Specimen Description BLOOD LEFT ARM  Final   Special Requests BOTTLES DRAWN AEROBIC AND ANAEROBIC  1CC  Final   Culture NO GROWTH 5 DAYS  Final   Report Status 08/17/2015 FINAL  Final  Wound culture     Status: None   Collection Time: 08/28/2015  9:20 AM  Result Value Ref Range Status   Specimen Description DECUBITIS  Final   Special Requests Normal  Final   Gram Stain   Final    FEW WBC SEEN MANY GRAM NEGATIVE RODS RARE GRAM POSITIVE COCCI    Culture   Final    HEAVY GROWTH ESCHERICHIA COLI MODERATE GROWTH ENTEROBACTER AEROGENES PROTEUS MIRABILIS HEAVY GROWTH ENTEROCOCCUS SPECIES VRE HAVE INTRINSIC RESISTANCE TO MOST COMMONLY USED ANTIBIOTICS AND THE ABILITY TO ACQUIRE RESISTANCE TO MOST AVAILABLE ANTIBIOTICS.    Report Status 08/16/2015 FINAL  Final   Organism ID, Bacteria ESCHERICHIA COLI  Final   Organism ID, Bacteria ENTEROBACTER AEROGENES  Final   Organism ID, Bacteria PROTEUS MIRABILIS  Final   Organism ID, Bacteria ENTEROCOCCUS SPECIES  Final      Susceptibility   Enterobacter aerogenes - MIC*    CEFTAZIDIME <=1 SENSITIVE Sensitive     CEFAZOLIN >=64 RESISTANT Resistant     CEFTRIAXONE <=1 SENSITIVE Sensitive     CIPROFLOXACIN <=0.25 SENSITIVE Sensitive     GENTAMICIN <=1 SENSITIVE Sensitive      IMIPENEM 1 SENSITIVE Sensitive     TRIMETH/SULFA <=20 SENSITIVE Sensitive     * MODERATE GROWTH ENTEROBACTER AEROGENES   Escherichia coli - MIC*    AMPICILLIN <=2 SENSITIVE Sensitive     CEFTAZIDIME <=1 SENSITIVE Sensitive     CEFAZOLIN <=4 SENSITIVE Sensitive     CEFTRIAXONE <=1 SENSITIVE Sensitive     CIPROFLOXACIN <=0.25 SENSITIVE Sensitive     GENTAMICIN <=1 SENSITIVE Sensitive     IMIPENEM <=0.25 SENSITIVE Sensitive     TRIMETH/SULFA <=20 SENSITIVE Sensitive     * HEAVY GROWTH ESCHERICHIA COLI   Proteus mirabilis - MIC*    AMPICILLIN >=32 RESISTANT Resistant     CEFTAZIDIME <=1 SENSITIVE Sensitive     CEFAZOLIN 8 SENSITIVE Sensitive     CEFTRIAXONE <=1 SENSITIVE Sensitive     CIPROFLOXACIN <=0.25 SENSITIVE Sensitive     GENTAMICIN <=1 SENSITIVE Sensitive     IMIPENEM 1 SENSITIVE Sensitive     TRIMETH/SULFA <=20 SENSITIVE Sensitive     * PROTEUS MIRABILIS   Enterococcus species - MIC*    AMPICILLIN >=32 RESISTANT Resistant     VANCOMYCIN >=32 RESISTANT Resistant     GENTAMICIN SYNERGY SENSITIVE Sensitive     TETRACYCLINE Value in next row Resistant      RESISTANT>=16    * HEAVY GROWTH ENTEROCOCCUS SPECIES  MRSA PCR Screening     Status: None   Collection Time: 03/08/15  2:38 PM  Result Value Ref Range Status   MRSA by PCR NEGATIVE NEGATIVE Final    Comment:        The GeneXpert MRSA Assay (FDA approved for NASAL specimens only), is one component of a comprehensive MRSA colonization surveillance program. It is not intended to diagnose MRSA infection nor to guide or monitor treatment for MRSA infections.   Blood culture (single)     Status: None   Collection Time: 03/08/15  3:36 PM  Result Value Ref Range Status   Specimen Description BLOOD RIGHT ASSIST CONTROL  Final   Special Requests BOTTLES DRAWN AEROBIC AND ANAEROBIC 5ML  Final   Culture NO GROWTH 5 DAYS  Final   Report Status 08/17/2015 FINAL  Final  Wound culture     Status: None   Collection Time:  08/13/15 12:37 PM  Result Value Ref Range Status   Specimen Description WOUND  Final   Special Requests Normal  Final   Gram Stain   Final    FEW WBC SEEN TOO NUMEROUS TO COUNT GRAM  NEGATIVE RODS FEW GRAM POSITIVE COCCI    Culture   Final    HEAVY GROWTH ESCHERICHIA COLI MODERATE GROWTH PROTEUS MIRABILIS LIGHT GROWTH KLEBSIELLA PNEUMONIAE MODERATE GROWTH ENTEROCOCCUS GALLINARUM CRITICAL RESULT CALLED TO, READ BACK BY AND VERIFIED WITH: [facility] BORBA AT 1042 08/16/15 DV    Report Status 08/17/2015 FINAL  Final   Organism ID, Bacteria ESCHERICHIA COLI  Final   Organism ID, Bacteria PROTEUS MIRABILIS  Final   Organism ID, Bacteria KLEBSIELLA PNEUMONIAE  Final   Organism ID, Bacteria ENTEROCOCCUS GALLINARUM  Final      Susceptibility   Escherichia coli - MIC*    AMPICILLIN >=32 RESISTANT Resistant     CEFTAZIDIME 4 RESISTANT Resistant     CEFAZOLIN >=64 RESISTANT Resistant     CEFTRIAXONE 16 RESISTANT Resistant     GENTAMICIN 2 SENSITIVE Sensitive     IMIPENEM >=16 RESISTANT Resistant     TRIMETH/SULFA <=20 SENSITIVE Sensitive     Extended ESBL POSITIVE Resistant     PIP/TAZO Value in next row Resistant      RESISTANT>=128    CIPROFLOXACIN Value in next row Sensitive      SENSITIVE<=0.25    * HEAVY GROWTH ESCHERICHIA COLI   Klebsiella pneumoniae - MIC*    AMPICILLIN Value in next row Resistant      SENSITIVE<=0.25    CEFTAZIDIME Value in next row Resistant      SENSITIVE<=0.25    CEFAZOLIN Value in next row Resistant      SENSITIVE<=0.25    CEFTRIAXONE Value in next row Resistant      SENSITIVE<=0.25    CIPROFLOXACIN Value in next row Resistant      SENSITIVE<=0.25    GENTAMICIN Value in next row Sensitive      SENSITIVE<=0.25    IMIPENEM Value in next row Resistant      SENSITIVE<=0.25    TRIMETH/SULFA Value in next row Resistant      SENSITIVE<=0.25    PIP/TAZO Value in next row Resistant      RESISTANT>=128    * LIGHT GROWTH KLEBSIELLA PNEUMONIAE   Proteus  mirabilis - MIC*    AMPICILLIN Value in next row Resistant      RESISTANT>=128    CEFTAZIDIME Value in next row Sensitive      RESISTANT>=128    CEFAZOLIN Value in next row Sensitive      RESISTANT>=128    CEFTRIAXONE Value in next row Sensitive      RESISTANT>=128    CIPROFLOXACIN Value in next row Sensitive      RESISTANT>=128    GENTAMICIN Value in next row Sensitive      RESISTANT>=128    IMIPENEM Value in next row Sensitive      RESISTANT>=128    TRIMETH/SULFA Value in next row Sensitive      RESISTANT>=128    PIP/TAZO Value in next row Sensitive      SENSITIVE<=4    * MODERATE GROWTH PROTEUS MIRABILIS   Enterococcus gallinarum - MIC*    AMPICILLIN Value in next row Resistant      SENSITIVE<=4    GENTAMICIN SYNERGY Value in next row Sensitive      SENSITIVE<=4    CIPROFLOXACIN Value in next row Resistant      RESISTANT>=8    TETRACYCLINE Value in next row Resistant      RESISTANT>=16    * MODERATE GROWTH ENTEROCOCCUS GALLINARUM  Wound culture     Status: None   Collection Time: 08/15/15  2:53 PM  Result  Value Ref Range Status   Specimen Description WOUND  Final   Special Requests NONE  Final   Gram Stain FEW WBC SEEN NO ORGANISMS SEEN   Final   Culture NO GROWTH 3 DAYS  Final   Report Status 08/18/2015 FINAL  Final  C difficile quick scan w PCR reflex     Status: None   Collection Time: 08/17/15 11:34 AM  Result Value Ref Range Status   C Diff antigen NEGATIVE NEGATIVE Final   C Diff toxin NEGATIVE NEGATIVE Final   C Diff interpretation Negative for C. difficile  Final  Stool culture     Status: None   Collection Time: 09/03/15  3:51 PM  Result Value Ref Range Status   Specimen Description STOOL  Final   Special Requests Immunocompromised  Final   Culture   Final    NO SALMONELLA OR SHIGELLA ISOLATED No Pathogenic E. coli detected NO CAMPYLOBACTER DETECTED    Report Status 09/07/2015 FINAL  Final  C difficile quick scan w PCR reflex     Status: None    Collection Time: 09/13/15 12:51 AM  Result Value Ref Range Status   C Diff antigen NEGATIVE NEGATIVE Final   C Diff toxin NEGATIVE NEGATIVE Final   C Diff interpretation Negative for C. difficile  Final  Culture, blood (routine x 2)     Status: None   Collection Time: 09/16/15 11:24 AM  Result Value Ref Range Status   Specimen Description BLOOD LEFT HAND  Final   Special Requests BOTTLES DRAWN AEROBIC AND ANAEROBIC  1CC  Final   Culture NO GROWTH 5 DAYS  Final   Report Status 09/21/2015 FINAL  Final  Culture, blood (routine x 2)     Status: None   Collection Time: 09/16/15 12:23 PM  Result Value Ref Range Status   Specimen Description BLOOD RIGHT HAND  Final   Special Requests BOTTLES DRAWN AEROBIC AND ANAEROBIC  1CC  Final   Culture  Setup Time   Final    GRAM POSITIVE COCCI AEROBIC BOTTLE ONLY CRITICAL RESULT CALLED TO, READ BACK BY AND VERIFIED WITH: TESS THOMAS,RN 09/17/2015 0631 BY JRS.    Culture   Final    ENTEROCOCCUS FAECALIS AEROBIC BOTTLE ONLY VRE HAVE INTRINSIC RESISTANCE TO MOST COMMONLY USED ANTIBIOTICS AND THE ABILITY TO ACQUIRE RESISTANCE TO MOST AVAILABLE ANTIBIOTICS. CRITICAL RESULT CALLED TO, READ BACK BY AND VERIFIED WITH: CHERYL SMITH AT 1610 09/19/15 DV    Report Status 09/21/2015 FINAL  Final   Organism ID, Bacteria ENTEROCOCCUS FAECALIS  Final      Susceptibility   Enterococcus faecalis - MIC*    AMPICILLIN <=2 SENSITIVE Sensitive     LINEZOLID 2 SENSITIVE Sensitive     CIPROFLOXACIN Value in next row Resistant      RESISTANT>=8    TETRACYCLINE Value in next row Resistant      RESISTANT>=16    VANCOMYCIN Value in next row Resistant      RESISTANT>=32    GENTAMICIN SYNERGY Value in next row Resistant      RESISTANT>=32    * ENTEROCOCCUS FAECALIS  Culture, respiratory (NON-Expectorated)     Status: None   Collection Time: 09/16/15  3:50 PM  Result Value Ref Range Status   Specimen Description TRACHEAL ASPIRATE  Final   Special Requests  Immunocompromised  Final   Gram Stain   Final    FEW WBC SEEN GOOD SPECIMEN - 80-90% WBCS RARE GRAM NEGATIVE RODS    Culture LIGHT  GROWTH PSEUDOMONAS AERUGINOSA  Final   Report Status 09/19/2015 FINAL  Final   Organism ID, Bacteria PSEUDOMONAS AERUGINOSA  Final      Susceptibility   Pseudomonas aeruginosa - MIC*    CEFTAZIDIME 8 SENSITIVE Sensitive     CIPROFLOXACIN 2 INTERMEDIATE Intermediate     GENTAMICIN >=16 RESISTANT Resistant     IMIPENEM >=16 RESISTANT Resistant     PIP/TAZO Value in next row Sensitive      SENSITIVE32    CEFEPIME Value in next row Sensitive      SENSITIVE8    LEVOFLOXACIN Value in next row Resistant      RESISTANT>=8    * LIGHT GROWTH PSEUDOMONAS AERUGINOSA  Culture, expectorated sputum-assessment     Status: None   Collection Time: 09/22/15  2:09 PM  Result Value Ref Range Status   Specimen Description ENDOTRACHEAL  Final   Special Requests Normal  Final   Sputum evaluation THIS SPECIMEN IS ACCEPTABLE FOR SPUTUM CULTURE  Final   Report Status 09/24/2015 FINAL  Final  Culture, respiratory (NON-Expectorated)     Status: None (Preliminary result)   Collection Time: 09/22/15  2:09 PM  Result Value Ref Range Status   Specimen Description ENDOTRACHEAL  Final   Special Requests Normal Reflexed from [id number]  Final   Gram Stain   Final    FEW WBC SEEN FEW GRAM NEGATIVE RODS POOR SPECIMEN - LESS THAN 70% WBCS    Culture   Final    MODERATE GROWTH PSEUDOMONAS AERUGINOSA SUSCEPTIBILITIES TO FOLLOW    Report Status PENDING  Incomplete  Tissue culture     Status: None   Collection Time: 09/24/15  6:44 AM  Result Value Ref Range Status   Specimen Description BONE  Final   Special Requests Normal  Final   Gram Stain MODERATE WBC SEEN FEW GRAM NEGATIVE RODS   Final   Culture   Final    MODERATE GROWTH ESCHERICHIA COLI MODERATE GROWTH KLEBSIELLA PNEUMONIAE ESBL-EXTENDED SPECTRUM BETA LACTAMASE-THE ORGANISM IS RESISTANT TO PENICILLINS, CEPHALOSPORINS AND  AZTREONAM ACCORDING TO CLSI M100-S15 VOL.25 N01 JAN 2005. ORGANISM 1 This organism isolate is resistant to one or more antiotic agents in three or more antimicrobial categories.  Suggest Infectious Disease consult.   ORGANISM 2 CRITICAL RESULT CALLED TO, READ BACK BY AND VERIFIED WITH: RN Mardene Celeste LINDSAY 09/27/15 1005AM    Report Status 09/28/2015 FINAL  Final   Organism ID, Bacteria ESCHERICHIA COLI  Final   Organism ID, Bacteria KLEBSIELLA PNEUMONIAE  Final      Susceptibility   Escherichia coli - MIC*    AMPICILLIN >=32 RESISTANT Resistant     CEFTAZIDIME 4 RESISTANT Resistant     CEFAZOLIN >=64 RESISTANT Resistant     CEFTRIAXONE 8 RESISTANT Resistant     GENTAMICIN <=1 SENSITIVE Sensitive     IMIPENEM 8 RESISTANT Resistant     TRIMETH/SULFA <=20 SENSITIVE Sensitive     Extended ESBL POSITIVE Resistant     PIP/TAZO Value in next row Resistant      RESISTANT>=128    * MODERATE GROWTH ESCHERICHIA COLI   Klebsiella pneumoniae - MIC*    AMPICILLIN Value in next row Resistant      RESISTANT>=128    CEFTAZIDIME Value in next row Resistant      RESISTANT>=128    CEFAZOLIN Value in next row Resistant      RESISTANT>=128    CEFTRIAXONE Value in next row Resistant      RESISTANT>=128    CIPROFLOXACIN Value in  next row Resistant      RESISTANT>=128    GENTAMICIN Value in next row Resistant      RESISTANT>=128    IMIPENEM Value in next row Resistant      RESISTANT>=128    TRIMETH/SULFA Value in next row Sensitive      RESISTANT>=128    PIP/TAZO Value in next row Resistant      RESISTANT>=128    * MODERATE GROWTH KLEBSIELLA PNEUMONIAE  Anaerobic culture     Status: None   Collection Time: 09/24/15  3:55 PM  Result Value Ref Range Status   Specimen Description BONE  Final   Special Requests Normal  Final   Culture NO ANAEROBES ISOLATED  Final   Report Status 09/29/2015 FINAL  Final    Coagulation Studies: No results for input(s): LABPROT, INR in the last 72  hours.  Urinalysis: No results for input(s): COLORURINE, LABSPEC, PHURINE, GLUCOSEU, HGBUR, BILIRUBINUR, KETONESUR, PROTEINUR, UROBILINOGEN, NITRITE, LEUKOCYTESUR in the last 72 hours.  Invalid input(s): APPERANCEUR    Imaging: No results found.   Medications:   . feeding supplement (VITAL AF 1.2 CAL) 1,000 mL (09/28/15 2000)   . antiseptic oral rinse  7 mL Mouth Rinse q12n4p  . chlorhexidine  15 mL Mouth Rinse BID  . DAPTOmycin (CUBICIN)  IV  500 mg Intravenous Q48H  . epoetin (EPOGEN/PROCRIT) injection  10,000 Units Intravenous Q M,W,F-HD  . feeding supplement (PRO-STAT SUGAR FREE 64)  30 mL Per Tube 6 times per day  . folic acid  1 mg Oral Daily  . free water  20 mL Per Tube 6 times per day  . heparin subcutaneous  5,000 Units Subcutaneous Q12H  . hydrocortisone  10 mg Per Tube BID  . insulin aspart  0-9 Units Subcutaneous 6 times per day  . ipratropium-albuterol  3 mL Nebulization QID  . lidocaine  1 patch Transdermal Q24H  . megestrol  400 mg Oral BID  . midodrine  5 mg Per Tube TID WC  . multivitamin  5 mL Oral Daily  . nystatin   Topical TID  . pantoprazole sodium  40 mg Per Tube Daily  . sodium chloride  10-40 mL Intracatheter Q12H  . sodium hypochlorite   Irrigation BID  . sulfamethoxazole-trimethoprim  2.5 tablet Per Tube q1800  . thiamine IV  100 mg Intravenous Daily   sodium chloride, sodium chloride, acetaminophen (TYLENOL) oral liquid 160 mg/5 mL, HYDROmorphone (DILAUDID) injection, loperamide, morphine injection, oxyCODONE, sodium chloride  Assessment/ Plan:  49 y.o. 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 Admitted on 07/26/2015  1. End-stage renal disease on hemodialysis on HD MWF. The patient has been on dialysis since October of 2014. R IJ permcath.  Tolerated dialysis yesterday with IV albumin for oncotic support - Plan on continuing hemodialysis on MWF schedule. Next treatment for tomorrow.  - midodine and albumin with treatment.  Also getting scheduled hydrocortisone.   2. Anemia of CKD: pt continues to require periodic blood transfusions, hemoglobin of 7.2 from 11/7 - continue epogen with HD. This has  been ordered as a standing order.  Multiple transfusions during hospitalization  3. Secondary hyperparathyroidism: PTH low at 55 - phos was acceptable at 3.3 - no binders  4. Sepsis: VRE in blood. Bone cultures reviewed - Treatment as per ID and ICU team: daptomycin to be completed on 11/10 and now started on trimethoprim/sulfamethoxazole.   5. Generalized Dependent edema/Anasarca due to 3rd spacing - UF with dialysis with albumin IV and midodrine , as tolerated  6. Acute resp failure - reintubated 09/25/15 - Appreciate pulmonary input.    LOS: 48 Courtney [name] 11/10/20169:01 AM

## 2015-09-29 NOTE — Evaluation (Signed)
Occupational Therapy Evaluation Patient Details Name: Courtney [name] MRN: [id number] DOB: 1966/07/09 Today's Date: 09/29/2015    History of Present Illness This patient is a [age] female who has been in medical facilities  For the past 2 years after gastric bypass surgery with multiple complications. Now with chronic trach, ESRD, profound debilitation, severe sacral pressure ulcer (stage IV). She went to a rehab facility. She [facility] to [facility]  ED 9/23 with AMS and hypotension. 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 She remains in CCU.  Also with R hip intertrochanteric fracture, recommended for conservative management by orthopedics.  On 11/5 had to return to vent for respiratory decline.  Physicians recommending hospice care/comfort measures at this time; husband wants continued medical care.   Clinical Impression   Patient did not participate in this evaluation and shook her head no when ever asked to participate. Hands are elevated and look better than last time. Will continue to monitor.    Follow Up Recommendations  LTACH;SNF    Equipment Recommendations       Recommendations for Other Services       Precautions / Restrictions Precautions Precaution Comments: stage 4 sacral ulcer, NG tube/NPO, trach, contact isolation, R IJ permcath, L IJ central line, log roll only (as needed for dressing changes/hygiene) and no ROM to R LE/no attempts at sitting, mechanical vent via trach      Mobility Bed Mobility Overal bed mobility:  (+2 per Physical Therapy)                Transfers                      Balance                                            ADL                                               Vision     Perception     Praxis      Pertinent Vitals/Pain Pain  Intervention(s): Repositioned     Hand Dominance     Extremity/Trunk Assessment Upper Extremity Assessment Upper Extremity Assessment:  (Patient's upper extremies are elevated and edema present but much less than the other day. R UE non functional L UE has some movement ) but patient declined active movement when requested.   Lower Extremity Assessment Lower Extremity Assessment: Defer to PT evaluation       Communication Communication Communication: Tracheostomy   Cognition Arousal/Alertness: Lethargic (did not open eyes)                         General Comments       Exercises       Shoulder Instructions      Home Living Family/patient expects to be discharged to:: Skilled nursing facility                                        Prior  Functioning/Environment Level of Independence: Needs assistance    ADL's / Homemaking Assistance Needed: Dependent for all ADL Communication / Swallowing Assistance Needed: Has trach, shakes head no      OT Diagnosis: Generalized weakness;Acute pain;Hemiplegia non-dominant side;Paresis;Altered mental status   OT Problem List:     OT Treatment/Interventions:  (upper extremity restoration - ADL)    OT Goals(Current goals can be found in the care plan section) Acute Rehab OT Goals Patient Stated Goal: did not set goal  OT Frequency: Min 1X/week   Barriers to D/C:            Co-evaluation              End of Session    Activity Tolerance: Patient limited by lethargy (shook head no at every request) Patient left:     Time: 1610-9604 OT Time Calculation (min): 13 min Charges:  OT General Charges $OT Visit: 1 Procedure OT Evaluation $Initial OT Evaluation Tier I: 1 Procedure G-Codes:    Gwyndolyn Kaufman, MS/OTR/L  09/29/2015, 3:24 PM

## 2015-09-29 NOTE — Progress Notes (Signed)
[facility] Progess Note  Name: Courtney GrinderHedda McMillian [name] MRN: [id number] DOB: 08-21-1966    ADMISSION DATE:  07/24/2015     INITIAL PRESENTATION:  49 AAF who has been in medical facilities (hosp, LTAC, rehab) for 2 yrs following gastric bypass surgery with multiple complications. Now with chronic trach, ESRD, profound debilitation, severe sacral pressure ulcer. Was seemingly making progress and transferred to rehab facility approx one week prior to this admission. [facility] with A[facility] and hypotension. Working dx of severe sepsis/septic shock due to infected sacral pressure ulcer. Since admission. Her course is been very complicated with numerous complications including septic shock and GI bleeding. Currently, she has an NG tube in place, she takes in minimal by mouth nutrition, though she appears to be able to do this, however, she is refusing to eat. Patient with recurrent sepsis  INTERVAL HISTORY: Placed on vasopressors and placed back on vent 11/5, but was able to wean off levophed on 11/6 afternoon.  [name] changed out due to cuff leak on  11/5 PM, #6 Shiley cuffed  The patient was started on Megace and thiamine, on 09/22/2015 per the husband's request.  11/7>>husband at bedside updated on current plan. Plan for HD today, cont with TF, cont with daptomycin, awaiting bone cx and s/s from trach aspirate. Pressure in pilot balloon of trach in the 50s, if cont to be this high, might need to upsize trach.    11/8> no acute events overnight, mild thin secretions noted, was on the vent at 35% FiO2 during the night. Creatinine slightly worse compared to yesterday; had dialysis yesterday, 1 L removed. Sacral Bone culture shows Klebsiella pneumonia and Escherichia coli  11/9> placed on ventilator rate last night, today tolerating dialysis, goal removal is 1 L, we'll attempt trach collar trials today, patient more interactive, will open eyes and nod. Patient is  currently on Septra and daptomycin.  11/10>tolerated about 8 hrs off TCT yesterday, placed back on vent overnight, somnolent this morning but more alert as the day progresses.  HD with 1 liter removed yesterday, BP stable, not requiring pressors. RT report inc secretion, will obtain CXR and labs today.   MAJOR EVENTS/TEST RESULTS: Admission 02/07/14-05/07/14 Admission 07/21/14-09/06/14 Discharged to [facility]. Pt had palliative consult at that time, were asked to sign off by husband.  09/23 CT head: NAD 09/23 EEG: no epileptiform activity 09/23 PRBCs for Hgb 6.4 09/24 bedside debridement of sacral wound. Abscess drained 09/25 Off vasopressors. More alert. No distress. Worsening thrombocytopenia. Vanc DC'd 09/29  Dr. [name] (I.D) excused from the case by patient's husband. 10/02 MRI -multiple infarcts 10/03 tracheal bleeding- transfused platelets 10/03 hospitalist service excused from the case by patient's husband 10/03 Echocardiogram ejection fraction was 55-60%, pulmonary systolic pressure was 39 mmHg 78/2910/08 restart TF's at lower rate, attempt reg HD  10/12 Transferred to med-surg floor. Remains on PCCM service 10/14 SLP eval: pt unable to tolerate PMV adequately 10/17-will re-attempt PM valve-discussed with Speech therapist 10/18 passed swallow eval-start pureed thick foods no thin liquids-continue NG feeds 10/19 transferred to step down for sepsis/aspiration pneumonia 10/19 cxr shows RLL opacity 10/21 sacral decub debride at bedside by surgery 10/22 started back on vasopressors while on HD 10/27 CT with osteo, R hip fx, unable to identify tip of dubhoff tube - sent for fluoro study 10/28 Ortho consultation: I do not feel that she is a surgical candidate. Therefore, I feel that it would best to manage this fracture nonsurgically and allow  it to heal by itself over time, which it should.  10/28 Gen Surg consultation: Due to the lack of free air or free flow of contrast and the peritoneum  there is no indication for any surgical intervention on this. Would recommend pulling back the feeding tube 1-2 cm. Would recheck an abdominal film to confirm no pneumoperitoneum in the morning. Absence of any changes okay to continue using Dobbhoff for feeding and medications. 10/28 gastrograffin study: The study confirms that the feeding tube pes perforated through the duodenum and the tip is within a cavity that fills with injected contrast. 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 Pneumoniae, P. Mirabilis, VRE Wound 9/26 >> No growth Blood 9/23 >> NEG CDiff 9/27>>neg Stool Cx 10/15>> negative Cdiff 10/25>>neg Trach Aspirate 10/28>> light growth pseudomonas Blood 10/28 >> 1/2 GPC >>  Sputum cultures obtained 09/22/15 due to mucus plugging>>Pseudomonas BONE TISSUE Cx 11/3>>E.coli (ESBL), k. Pneumonia (daptomycin and septra)  ANTIMICROBIALS:  Aztreonam 9/23 >> 9/24 Vanc 9/23 >> 9/25 Vanc 9/26>>9/27 Daptomycin 9/27>> 10/12, 10/28>>  Meropenem 9/23 >> 10/14, 10/28 >> 10/31 Septra 11/8>>   VITAL SIGNS: Temp:  [98 F (36.7 C)-98.9 F (37.2 C)] 98.4 F (36.9 C) (11/10 0730) Pulse Rate:  [98-123] 113 (11/10 1000) Resp:  [0-31] 22 (11/10 1000) BP: (115-131)/(53-93) 120/55 mmHg (11/10 1000) SpO2:  [98  %-100 %] 100 % (11/10 1000) FiO2 (%):  [28 %-40 %] 30 % (11/10 0900) HEMODYNAMICS:   VENTILATOR SETTINGS: Vent Mode:  [-] PRVC FiO2 (%):  [28 %-40 %] 30 % Set Rate:  [15 bmp] 15 bmp Vt Set:  [500 mL] 500 mL PEEP:  [5 cmH20] 5 cmH20 INTAKE / OUTPUT:  Intake/Output Summary (Last 24 hours) at 09/29/15 1200 Last data filed at 09/29/15 1057  Gross per 24 hour  Intake 1256.67 ml  Output   1002 ml  Net 254.67 ml    PHYSICAL EXAMINATION: Physical Examination:   VS: BP 120/55 mmHg  Pulse 113  Temp(Src) 98.4 F (36.9 C) (Axillary)  Resp 22  Ht  (1.753 m)  Wt 216 lb 0.8 oz (98 kg)  BMI 31.89 kg/m2  SpO2 100%  LMP  (LMP Unknown)  General Appearance: No resp distress, coarse upper airway sounds, on the vent Neuro: profoundly diffusely weak, alert today, nodding head HEENT: cushingoid facies, PERRLA, EOM intact Neck: trach site clean Pulmonary: clear anteriorly Cardiovascular: reg, + syst murmur Abdomen: soft, NT, +BS Extremities: warm, R>L UE edema Examination of the sacral decubitus ulcer- showed a very deep ulcer approximately 7-8 cm across.   stage IV ulcer.   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 BMET intermittently Monitor I/Os Correct electrolytes as indicated Intermittent HD per Renal Service (MWF) -plan to wean off vent as tolerated, goal TCT today is 12 hrs -check CXR today and labs (CBC, BMP,  ABG).   GASTROINTESTINAL A:  LGIB, resolved Hx of C. Diff Stool incontinence  P:  Cont PPI Resume TFs 10/30 goal now at 50 cc/hr -will not be able to take oral feeds as she is on the vent  HEMATOLOGIC A:  Acute on chronic anemia S/p Thrombocytopenia - HIT panel negative 9/25, resolved P: DVT px: SCDs + SQ heparin Monitor CBC intermittently Transfuse per usual ICU guidelines  INFECTIOUS A:  Recurrent Severe sepsis, due to sacral decub, currently stable.  Sacral decubitus ulcer with abscess - on Septra and daptomycin ID actively following along, appreciate recs - currently on daptomycin & septra  P:  Monitor temp, WBC count Micro and abx as above  ENDOCRINE A:  -DM 2, controlled. The patient developed some hypoglycemia when the tube feed was stopped and therefore she was started on D10 because she did not take in an adequate amount of calories to maintain her blood glucose levels in the normal range.  -Chronic steroid therapy, for a history of of adrenal insufficiency. P:  Monitor CBGs q 8 hrs - resume SSI if > 180 Cont hydrocortisone @ 10 mg BID Continue  tube feeds. We'll need to monitor blood glucose levels.  MSK A: Right hip fx - small intertrochanteric R fx - pain management. Ortho evaluated and felt not a surgical candidate at this time.   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 Belia Heman and other members of the medical team has meet and discussed prognosis with patient's husband on 09/25/15 - Per Dr. Belia Heman "I recommend Hospice and comfort care measures-patient with evidence of pain and suffering." - Per case manager: "Husband will be speaking with close family members about poor prognosis. He will attempt to obtain responses from patient dealing with her wish for continued care in the presence  of bedside nurses." - at this time, continue with current medical care  I have personally obtained a history, examined the patient, evaluated Pertinent laboratory and RadioGraphic/imaging results, and  formulated the assessment and plan  The Patient requires high complexity decision making for assessment and support, frequent evaluation and titration of therapies, application of advanced monitoring technologies and extensive interpretation of multiple databases. Critical Care Time devoted to patient care services described in this note is 45 minutes.   Overall, patient is critically ill, prognosis is guarded.  Patient with Multiorgan failure and at high risk for cardiac arrest and death. prognosis is very poor, patient with very poor chance of meaningful recovery. Patient with extensive osteomyelitis of the sacrum/coccyx/T8-T9 spine, will need PEG tube for long term.    Stephanie Acre, MD [facility] Pager [phone] (please enter 7-digits) On Call Pager - [phone] (please enter 7-digits)

## 2015-09-30 LAB — TYPE AND SCREEN
ABO/RH(D): A NEG
ANTIBODY SCREEN: NEGATIVE
Unit division: 0

## 2015-09-30 LAB — RENAL FUNCTION PANEL
ANION GAP: 7 (ref 5–15)
Albumin: 1.8 g/dL — ABNORMAL LOW (ref 3.5–5.0)
BUN: 71 mg/dL — AB (ref 6–20)
CHLORIDE: 103 mmol/L (ref 101–111)
CO2: 33 mmol/L — AB (ref 22–32)
Calcium: 8.4 mg/dL — ABNORMAL LOW (ref 8.9–10.3)
Creatinine, Ser: 1.36 mg/dL — ABNORMAL HIGH (ref 0.44–1.00)
GFR calc Af Amer: 52 mL/min — ABNORMAL LOW (ref >60)
GFR calc non Af Amer: 45 mL/min — ABNORMAL LOW (ref >60)
GLUCOSE: 130 mg/dL — AB (ref 65–99)
POTASSIUM: 4.2 mmol/L (ref 3.5–5.1)
Phosphorus: 3.7 mg/dL (ref 2.5–4.6)
Sodium: 143 mmol/L (ref 135–145)

## 2015-09-30 LAB — CBC
HCT: 26.3 % — ABNORMAL LOW (ref 35.0–47.0)
HEMATOCRIT: 28.5 % — AB (ref 35.0–47.0)
HEMOGLOBIN: 9.1 g/dL — AB (ref 12.0–16.0)
Hemoglobin: 8.3 g/dL — ABNORMAL LOW (ref 12.0–16.0)
MCH: 31.6 pg (ref 26.0–34.0)
MCH: 32.2 pg (ref 26.0–34.0)
MCHC: 31.4 g/dL — AB (ref 32.0–36.0)
MCHC: 32.1 g/dL (ref 32.0–36.0)
MCV: 100.6 fL — AB (ref 80.0–100.0)
MCV: 100.3 fL — AB (ref 80.0–100.0)
PLATELETS: 143 10*3/uL — AB (ref 150–440)
Platelets: 171 10*3/uL (ref 150–440)
RBC: 2.62 MIL/uL — ABNORMAL LOW (ref 3.80–5.20)
RBC: 2.84 MIL/uL — ABNORMAL LOW (ref 3.80–5.20)
RDW: 22.2 % — AB (ref 11.5–14.5)
RDW: 22.2 % — AB (ref 11.5–14.5)
WBC: 8.6 10*3/uL (ref 3.6–11.0)
WBC: 10 10*3/uL (ref 3.6–11.0)

## 2015-09-30 LAB — GLUCOSE, CAPILLARY
GLUCOSE-CAPILLARY: 124 mg/dL — AB (ref 65–99)
GLUCOSE-CAPILLARY: 124 mg/dL — AB (ref 65–99)
GLUCOSE-CAPILLARY: 91 mg/dL (ref 65–99)
Glucose-Capillary: 123 mg/dL — ABNORMAL HIGH (ref 65–99)
Glucose-Capillary: 119 mg/dL — ABNORMAL HIGH (ref 65–99)

## 2015-09-30 MED ORDER — ALBUMIN HUMAN 25 % IV SOLN
25.0000 g | Freq: Once | INTRAVENOUS | Status: AC
  Administered 2015-09-30: 25 g via INTRAVENOUS
  Filled 2015-09-30: qty 100

## 2015-09-30 NOTE — Progress Notes (Signed)
Nutrition Follow-up  DOCUMENTATION CODES:   Severe malnutrition in context of acute illness/injury  INTERVENTION:   EN: recommend continuing TF at current rate of 50 ml/hr; once stable on trach collar, recommend increasing to goal of 65 ml/hr. Discussed with MD Mungal during ICU rounds, MD agrees with this plan. MD ok with increasing TF if pt continues to remains on trach collar over the next 24 hours. Continue to assess  NUTRITION DIAGNOSIS:   Inadequate oral intake related to wound healing, acute illness, chronic illness as evidenced by NPO status, estimated needs.  GOAL:   Patient will meet greater than or equal to 90% of their needs  MONITOR:    (Energy Intake, Anthropometrics, Digestive System, Electrolyte/Renal Profile, Glucose Profile)  REASON FOR ASSESSMENT:   Consult Enteral/tube feeding initiation and management  ASSESSMENT:    Pt on trach collar since yesterday afternoon, trial of PMV this AM, HD as scheduled  Diet Order:   NPO  EN: tolerating Vital 1.2 AF at rate of 50 ml/hr  Digestive System: no signs of TF intolerance, 2-3 loose stools per day  Skin:   (stage IV sacrum, stage III hip, unstageable foot; rectal ulcer from fleixseal, ulcer in nose from dobhoff)  Electrolyte and Renal Profile:  Recent Labs Lab 09/26/15 0428 09/26/15 1100 09/29/15 1210  BUN  --  69* 48*  CREATININE  --  1.51* 0.99  NA  --  139 141  K  --  3.5 3.3*  PHOS 3.1 3.2  --    Glucose Profile:  Recent Labs  09/30/15 0413 09/30/15 0732 09/30/15 1120  GLUCAP 124* 123* 119*   Meds: ss novolog, MVI, megace  Height:   Ht Readings from Last 1 Encounters:  07/25/2015 5\' 9"  (1.753 m)    Weight:   Wt Readings from Last 1 Encounters:  09/30/15 218 lb 4.1 oz (99 kg)    BMI:  Body mass index is 32.22 kg/(m^2).  Estimated Nutritional Needs:   Kcal:  2175-2535 kcals (BEE 1509, 1.2 AF, 1.2-1.4 IF) or 2490-2905 kcals (30-35 kcals/kg)   Protein:  125-166 g (1.5-2.0 g/kg)  but likely closer to 166-208 g (2.0-2.5 g/kg)   Fluid:  1000 mL plus UOP  EDUCATION NEEDS:   No education needs identified at this time  HIGH Care Level  Romelle Starcherate Gardy Montanari MS, RD, LDN [phone] Pager

## 2015-09-30 NOTE — Progress Notes (Signed)
Subjective:   Off vent, off sedation, following commands intermittently.   Hemodialysis for later today.    1 unit of PRBC Objective:  Vital signs in last 24 hours:  Temp:  [98.3 F (36.8 C)-98.9 F (37.2 C)] 98.7 F (37.1 C) (11/11 0741) Pulse Rate:  [97-116] 113 (11/11 0810) Resp:  [13-32] 26 (11/11 0810) BP: (108-141)/(55-88) 141/88 mmHg (11/11 0800) SpO2:  [90 %-100 %] 90 % (11/11 0810) FiO2 (%):  [28 %-30 %] 28 % (11/11 0810) Weight:  [99 kg (218 lb 4.1 oz)-103 kg (227 lb 1.2 oz)] 99 kg (218 lb 4.1 oz) (11/11 0500)  Weight change: 5 kg (11 lb 0.4 oz) Filed Weights   09/28/15 0845 09/29/15 2200 09/30/15 0500  Weight: 98 kg (216 lb 0.8 oz) 103 kg (227 lb 1.2 oz) 99 kg (218 lb 4.1 oz)    Intake/Output: I/O last 3 completed shifts: In: 2340 [I.V.:10; Blood:370; NG/GT:1960] Out: -    Intake/Output this shift:  Total I/O In: 70 [NG/GT:70] Out: -   Physical Exam: General: No acute distress, critically ill  Head: NG in place, eyes open  Eyes: anicteric  Neck: Tracheostomy  Lungs:  Scattered rhonchi,   Heart: S1S2 +tachycardia  Abdomen:  Soft, nontender, BS present  Extremities: + dependent edema, anasarca  Neurologic: Not waking up off sedation  Access:  R IJ permcath.       Basic Metabolic Panel:  Recent Labs Lab 09/26/15 0428 09/26/15 1100 09/29/15 1210  NA  --  139 141  K  --  3.5 3.3*  CL  --  103 104  CO2  --  31 33*  GLUCOSE  --  150* 139*  BUN  --  69* 48*  CREATININE  --  1.51* 0.99  CALCIUM  --  7.8* 8.0*  PHOS 3.1 3.2  --     Liver Function Tests:  Recent Labs Lab 09/26/15 1100  ALBUMIN 1.7*   No results for input(s): LIPASE, AMYLASE in the last 168 hours. No results for input(s): AMMONIA in the last 168 hours.  CBC:  Recent Labs Lab 09/25/15 1400 09/26/15 0428 09/29/15 1210 09/30/15 0522  WBC 13.0* 12.1* 9.1 8.6  NEUTROABS 11.5*  --   --   --   HGB 7.1* 7.2* 6.9* 8.3*  HCT 23.4* 22.9* 22.5* 26.3*  MCV 104.4* 103.4*  104.6* 100.6*  PLT 133* 141* 128* 143*    Cardiac Enzymes:  Recent Labs Lab 09/27/15 1311  CKTOTAL 10*    BNP: Invalid input(s): POCBNP  CBG:  Recent Labs Lab 09/29/15 1546 09/29/15 2035 09/29/15 2325 09/30/15 0413 09/30/15 0732  GLUCAP 113* 120* 123* 124* 123*    Microbiology: Results for orders placed or performed during the hospital encounter of 08/15/2015  Blood Culture (routine x 2)     Status: None   Collection Time: 08/15/2015  8:51 AM  Result Value Ref Range Status   Specimen Description BLOOD [name]  Final   Special Requests BOTTLES DRAWN AEROBIC AND ANAEROBIC  3CC  Final   Culture NO GROWTH 5 DAYS  Final   Report Status 08/17/2015 FINAL  Final  Blood Culture (routine x 2)     Status: None   Collection Time: 07/27/2015  9:20 AM  Result Value Ref Range Status   Specimen Description BLOOD LEFT ARM  Final   Special Requests BOTTLES DRAWN AEROBIC AND ANAEROBIC  1CC  Final   Culture NO GROWTH 5 DAYS  Final   Report Status 08/17/2015 FINAL  Final  Wound culture     Status: None   Collection Time: 07/25/2015  9:20 AM  Result Value Ref Range Status   Specimen Description DECUBITIS  Final   Special Requests Normal  Final   Gram Stain   Final    FEW WBC SEEN MANY GRAM NEGATIVE RODS RARE GRAM POSITIVE COCCI    Culture   Final    HEAVY GROWTH ESCHERICHIA COLI MODERATE GROWTH ENTEROBACTER AEROGENES PROTEUS MIRABILIS HEAVY GROWTH ENTEROCOCCUS SPECIES VRE HAVE INTRINSIC RESISTANCE TO MOST COMMONLY USED ANTIBIOTICS AND THE ABILITY TO ACQUIRE RESISTANCE TO MOST AVAILABLE ANTIBIOTICS.    Report Status 08/16/2015 FINAL  Final   Organism ID, Bacteria ESCHERICHIA COLI  Final   Organism ID, Bacteria ENTEROBACTER AEROGENES  Final   Organism ID, Bacteria PROTEUS MIRABILIS  Final   Organism ID, Bacteria ENTEROCOCCUS SPECIES  Final      Susceptibility   Enterobacter aerogenes - MIC*    CEFTAZIDIME <=1 SENSITIVE Sensitive     CEFAZOLIN >=64 RESISTANT Resistant     CEFTRIAXONE  <=1 SENSITIVE Sensitive     CIPROFLOXACIN <=0.25 SENSITIVE Sensitive     GENTAMICIN <=1 SENSITIVE Sensitive     IMIPENEM 1 SENSITIVE Sensitive     TRIMETH/SULFA <=20 SENSITIVE Sensitive     * MODERATE GROWTH ENTEROBACTER AEROGENES   Escherichia coli - MIC*    AMPICILLIN <=2 SENSITIVE Sensitive     CEFTAZIDIME <=1 SENSITIVE Sensitive     CEFAZOLIN <=4 SENSITIVE Sensitive     CEFTRIAXONE <=1 SENSITIVE Sensitive     CIPROFLOXACIN <=0.25 SENSITIVE Sensitive     GENTAMICIN <=1 SENSITIVE Sensitive     IMIPENEM <=0.25 SENSITIVE Sensitive     TRIMETH/SULFA <=20 SENSITIVE Sensitive     * HEAVY GROWTH ESCHERICHIA COLI   Proteus mirabilis - MIC*    AMPICILLIN >=32 RESISTANT Resistant     CEFTAZIDIME <=1 SENSITIVE Sensitive     CEFAZOLIN 8 SENSITIVE Sensitive     CEFTRIAXONE <=1 SENSITIVE Sensitive     CIPROFLOXACIN <=0.25 SENSITIVE Sensitive     GENTAMICIN <=1 SENSITIVE Sensitive     IMIPENEM 1 SENSITIVE Sensitive     TRIMETH/SULFA <=20 SENSITIVE Sensitive     * PROTEUS MIRABILIS   Enterococcus species - MIC*    AMPICILLIN >=32 RESISTANT Resistant     VANCOMYCIN >=32 RESISTANT Resistant     GENTAMICIN SYNERGY SENSITIVE Sensitive     TETRACYCLINE Value in next row Resistant      RESISTANT>=16    * HEAVY GROWTH ENTEROCOCCUS SPECIES  MRSA PCR Screening     Status: None   Collection Time: 08/11/2015  2:38 PM  Result Value Ref Range Status   MRSA by PCR NEGATIVE NEGATIVE Final    Comment:        The GeneXpert MRSA Assay (FDA approved for NASAL specimens only), is one component of a comprehensive MRSA colonization surveillance program. It is not intended to diagnose MRSA infection nor to guide or monitor treatment for MRSA infections.   Blood culture (single)     Status: None   Collection Time: 08/08/2015  3:36 PM  Result Value Ref Range Status   Specimen Description BLOOD RIGHT ASSIST CONTROL  Final   Special Requests BOTTLES DRAWN AEROBIC AND ANAEROBIC  Final   Culture NO GROWTH  5 DAYS  Final   Report Status 08/17/2015 FINAL  Final  Wound culture     Status: None   Collection Time: 08/13/15 12:37 PM  Result Value Ref Range Status   Specimen Description  WOUND  Final   Special Requests Normal  Final   Gram Stain   Final    FEW WBC SEEN TOO NUMEROUS TO COUNT GRAM NEGATIVE RODS FEW GRAM POSITIVE COCCI    Culture   Final    HEAVY GROWTH ESCHERICHIA COLI MODERATE GROWTH PROTEUS MIRABILIS LIGHT GROWTH KLEBSIELLA PNEUMONIAE MODERATE GROWTH ENTEROCOCCUS GALLINARUM CRITICAL RESULT CALLED TO, READ BACK BY AND VERIFIED WITH: [facility] BORBA AT 1042 08/16/15 DV    Report Status 08/17/2015 FINAL  Final   Organism ID, Bacteria ESCHERICHIA COLI  Final   Organism ID, Bacteria PROTEUS MIRABILIS  Final   Organism ID, Bacteria KLEBSIELLA PNEUMONIAE  Final   Organism ID, Bacteria ENTEROCOCCUS GALLINARUM  Final      Susceptibility   Escherichia coli - MIC*    AMPICILLIN >=32 RESISTANT Resistant     CEFTAZIDIME 4 RESISTANT Resistant     CEFAZOLIN >=64 RESISTANT Resistant     CEFTRIAXONE 16 RESISTANT Resistant     GENTAMICIN 2 SENSITIVE Sensitive     IMIPENEM >=16 RESISTANT Resistant     TRIMETH/SULFA <=20 SENSITIVE Sensitive     Extended ESBL POSITIVE Resistant     PIP/TAZO Value in next row Resistant      RESISTANT>=128    CIPROFLOXACIN Value in next row Sensitive      SENSITIVE<=0.25    * HEAVY GROWTH ESCHERICHIA COLI   Klebsiella pneumoniae - MIC*    AMPICILLIN Value in next row Resistant      SENSITIVE<=0.25    CEFTAZIDIME Value in next row Resistant      SENSITIVE<=0.25    CEFAZOLIN Value in next row Resistant      SENSITIVE<=0.25    CEFTRIAXONE Value in next row Resistant      SENSITIVE<=0.25    CIPROFLOXACIN Value in next row Resistant      SENSITIVE<=0.25    GENTAMICIN Value in next row Sensitive      SENSITIVE<=0.25    IMIPENEM Value in next row Resistant      SENSITIVE<=0.25    TRIMETH/SULFA Value in next row Resistant      SENSITIVE<=0.25    PIP/TAZO  Value in next row Resistant      RESISTANT>=128    * LIGHT GROWTH KLEBSIELLA PNEUMONIAE   Proteus mirabilis - MIC*    AMPICILLIN Value in next row Resistant      RESISTANT>=128    CEFTAZIDIME Value in next row Sensitive      RESISTANT>=128    CEFAZOLIN Value in next row Sensitive      RESISTANT>=128    CEFTRIAXONE Value in next row Sensitive      RESISTANT>=128    CIPROFLOXACIN Value in next row Sensitive      RESISTANT>=128    GENTAMICIN Value in next row Sensitive      RESISTANT>=128    IMIPENEM Value in next row Sensitive      RESISTANT>=128    TRIMETH/SULFA Value in next row Sensitive      RESISTANT>=128    PIP/TAZO Value in next row Sensitive      SENSITIVE<=4    * MODERATE GROWTH PROTEUS MIRABILIS   Enterococcus gallinarum - MIC*    AMPICILLIN Value in next row Resistant      SENSITIVE<=4    GENTAMICIN SYNERGY Value in next row Sensitive      SENSITIVE<=4    CIPROFLOXACIN Value in next row Resistant      RESISTANT>=8    TETRACYCLINE Value in next row Resistant  RESISTANT>=16    * MODERATE GROWTH ENTEROCOCCUS GALLINARUM  Wound culture     Status: None   Collection Time: 08/15/15  2:53 PM  Result Value Ref Range Status   Specimen Description WOUND  Final   Special Requests NONE  Final   Gram Stain FEW WBC SEEN NO ORGANISMS SEEN   Final   Culture NO GROWTH 3 DAYS  Final   Report Status 08/18/2015 FINAL  Final  C difficile quick scan w PCR reflex     Status: None   Collection Time: 08/17/15 11:34 AM  Result Value Ref Range Status   C Diff antigen NEGATIVE NEGATIVE Final   C Diff toxin NEGATIVE NEGATIVE Final   C Diff interpretation Negative for C. difficile  Final  Stool culture     Status: None   Collection Time: 09/03/15  3:51 PM  Result Value Ref Range Status   Specimen Description STOOL  Final   Special Requests Immunocompromised  Final   Culture   Final    NO SALMONELLA OR SHIGELLA ISOLATED No Pathogenic E. coli detected NO CAMPYLOBACTER DETECTED     Report Status 09/07/2015 FINAL  Final  C difficile quick scan w PCR reflex     Status: None   Collection Time: 09/13/15 12:51 AM  Result Value Ref Range Status   C Diff antigen NEGATIVE NEGATIVE Final   C Diff toxin NEGATIVE NEGATIVE Final   C Diff interpretation Negative for C. difficile  Final  Culture, blood (routine x 2)     Status: None   Collection Time: 09/16/15 11:24 AM  Result Value Ref Range Status   Specimen Description BLOOD LEFT HAND  Final   Special Requests BOTTLES DRAWN AEROBIC AND ANAEROBIC  1CC  Final   Culture NO GROWTH 5 DAYS  Final   Report Status 09/21/2015 FINAL  Final  Culture, blood (routine x 2)     Status: None   Collection Time: 09/16/15 12:23 PM  Result Value Ref Range Status   Specimen Description BLOOD RIGHT HAND  Final   Special Requests BOTTLES DRAWN AEROBIC AND ANAEROBIC  1CC  Final   Culture  Setup Time   Final    GRAM POSITIVE COCCI AEROBIC BOTTLE ONLY CRITICAL RESULT CALLED TO, READ BACK BY AND VERIFIED WITH: TESS THOMAS,RN 09/17/2015 0631 BY JRS.    Culture   Final    ENTEROCOCCUS FAECALIS AEROBIC BOTTLE ONLY VRE HAVE INTRINSIC RESISTANCE TO MOST COMMONLY USED ANTIBIOTICS AND THE ABILITY TO ACQUIRE RESISTANCE TO MOST AVAILABLE ANTIBIOTICS. CRITICAL RESULT CALLED TO, READ BACK BY AND VERIFIED WITH: CHERYL SMITH AT [id number] 09/19/15 DV    Report Status 09/21/2015 FINAL  Final   Organism ID, Bacteria ENTEROCOCCUS FAECALIS  Final      Susceptibility   Enterococcus faecalis - MIC*    AMPICILLIN <=2 SENSITIVE Sensitive     LINEZOLID 2 SENSITIVE Sensitive     CIPROFLOXACIN Value in next row Resistant      RESISTANT>=8    TETRACYCLINE Value in next row Resistant      RESISTANT>=16    VANCOMYCIN Value in next row Resistant      RESISTANT>=32    GENTAMICIN SYNERGY Value in next row Resistant      RESISTANT>=32    * ENTEROCOCCUS FAECALIS  Culture, respiratory (NON-Expectorated)     Status: None   Collection Time: 09/16/15  3:50 PM  Result Value  Ref Range Status   Specimen Description TRACHEAL ASPIRATE  Final   Special Requests Immunocompromised  Final   Gram Stain   Final    FEW WBC SEEN GOOD SPECIMEN - 80-90% WBCS RARE GRAM NEGATIVE RODS    Culture LIGHT GROWTH PSEUDOMONAS AERUGINOSA  Final   Report Status 09/19/2015 FINAL  Final   Organism ID, Bacteria PSEUDOMONAS AERUGINOSA  Final      Susceptibility   Pseudomonas aeruginosa - MIC*    CEFTAZIDIME 8 SENSITIVE Sensitive     CIPROFLOXACIN 2 INTERMEDIATE Intermediate     GENTAMICIN >=16 RESISTANT Resistant     IMIPENEM >=16 RESISTANT Resistant     PIP/TAZO Value in next row Sensitive      SENSITIVE32    CEFEPIME Value in next row Sensitive      SENSITIVE8    LEVOFLOXACIN Value in next row Resistant      RESISTANT>=8    * LIGHT GROWTH PSEUDOMONAS AERUGINOSA  Culture, expectorated sputum-assessment     Status: None   Collection Time: 09/22/15  2:09 PM  Result Value Ref Range Status   Specimen Description ENDOTRACHEAL  Final   Special Requests Normal  Final   Sputum evaluation THIS SPECIMEN IS ACCEPTABLE FOR SPUTUM CULTURE  Final   Report Status 09/24/2015 FINAL  Final  Culture, respiratory (NON-Expectorated)     Status: None (Preliminary result)   Collection Time: 09/22/15  2:09 PM  Result Value Ref Range Status   Specimen Description ENDOTRACHEAL  Final   Special Requests Normal Reflexed from [id number]  Final   Gram Stain   Final    FEW WBC SEEN FEW GRAM NEGATIVE RODS POOR SPECIMEN - LESS THAN 70% WBCS    Culture   Final    MODERATE GROWTH PSEUDOMONAS AERUGINOSA SUSCEPTIBILITIES TO FOLLOW    Report Status PENDING  Incomplete  Tissue culture     Status: None   Collection Time: 09/24/15  6:44 AM  Result Value Ref Range Status   Specimen Description BONE  Final   Special Requests Normal  Final   Gram Stain MODERATE WBC SEEN FEW GRAM NEGATIVE RODS   Final   Culture   Final    MODERATE GROWTH ESCHERICHIA COLI MODERATE GROWTH KLEBSIELLA PNEUMONIAE ESBL-EXTENDED  SPECTRUM BETA LACTAMASE-THE ORGANISM IS RESISTANT TO PENICILLINS, CEPHALOSPORINS AND AZTREONAM ACCORDING TO CLSI [id number] VOL.25 N01 JAN 2005. ORGANISM 1 This organism isolate is resistant to one or more antiotic agents in three or more antimicrobial categories.  Suggest Infectious Disease consult.   ORGANISM 2 CRITICAL RESULT CALLED TO, READ BACK BY AND VERIFIED WITH: RN Mardene Celeste LINDSAY 09/27/15 1005AM    Report Status 09/28/2015 FINAL  Final   Organism ID, Bacteria ESCHERICHIA COLI  Final   Organism ID, Bacteria KLEBSIELLA PNEUMONIAE  Final      Susceptibility   Escherichia coli - MIC*    AMPICILLIN >=32 RESISTANT Resistant     CEFTAZIDIME 4 RESISTANT Resistant     CEFAZOLIN >=64 RESISTANT Resistant     CEFTRIAXONE 8 RESISTANT Resistant     GENTAMICIN <=1 SENSITIVE Sensitive     IMIPENEM 8 RESISTANT Resistant     TRIMETH/SULFA <=20 SENSITIVE Sensitive     Extended ESBL POSITIVE Resistant     PIP/TAZO Value in next row Resistant      RESISTANT>=128    * MODERATE GROWTH ESCHERICHIA COLI   Klebsiella pneumoniae - MIC*    AMPICILLIN Value in next row Resistant      RESISTANT>=128    CEFTAZIDIME Value in next row Resistant      RESISTANT>=128    CEFAZOLIN Value in next row  Resistant      RESISTANT>=128    CEFTRIAXONE Value in next row Resistant      RESISTANT>=128    CIPROFLOXACIN Value in next row Resistant      RESISTANT>=128    GENTAMICIN Value in next row Resistant      RESISTANT>=128    IMIPENEM Value in next row Resistant      RESISTANT>=128    TRIMETH/SULFA Value in next row Sensitive      RESISTANT>=128    PIP/TAZO Value in next row Resistant      RESISTANT>=128    * MODERATE GROWTH KLEBSIELLA PNEUMONIAE  Anaerobic culture     Status: None   Collection Time: 09/24/15  3:55 PM  Result Value Ref Range Status   Specimen Description BONE  Final   Special Requests Normal  Final   Culture NO ANAEROBES ISOLATED  Final   Report Status 09/29/2015 FINAL  Final    Coagulation  Studies: No results for input(s): LABPROT, INR in the last 72 hours.  Urinalysis: No results for input(s): COLORURINE, LABSPEC, PHURINE, GLUCOSEU, HGBUR, BILIRUBINUR, KETONESUR, PROTEINUR, UROBILINOGEN, NITRITE, LEUKOCYTESUR in the last 72 hours.  Invalid input(s): APPERANCEUR    Imaging: Dg Chest Port 1 View  09/29/2015  CLINICAL DATA:  Respiratory failure, tracheostomy EXAM: PORTABLE CHEST 1 VIEW COMPARISON:  09/21/2015 chest radiograph. FINDINGS: Enteric tube enters the stomach with the tip not seen on this image. Right internal jugular central venous catheter terminates near the cavoatrial junction. Left internal jugular central venous catheter terminates in the middle third of the superior vena cava. Tracheostomy tube tip overlies the tracheal air column at the thoracic inlet. Stable cardiomediastinal silhouette with mild cardiomegaly. No pneumothorax. Trace left pleural effusion a increased. No right pleural effusion. No overt pulmonary edema. Patchy right lung base opacities, not appreciably changed. IMPRESSION: 1. Stable mild cardiomegaly without pulmonary edema. 2. Patchy right lung base opacities, not appreciably changed, favor atelectasis, cannot exclude aspiration or pneumonia. 3. Well-positioned lines and tubes. Electronically Signed   By: Delbert Phenix M.D.   On: 09/29/2015 14:53     Medications:   . feeding supplement (VITAL AF 1.2 CAL) 1,000 mL (09/29/15 2000)   . antiseptic oral rinse  7 mL Mouth Rinse q12n4p  . chlorhexidine  15 mL Mouth Rinse BID  . epoetin (EPOGEN/PROCRIT) injection  10,000 Units Intravenous Q M,W,F-HD  . feeding supplement (PRO-STAT SUGAR FREE 64)  30 mL Per Tube 6 times per day  . folic acid  1 mg Oral Daily  . free water  20 mL Per Tube 6 times per day  . heparin subcutaneous  5,000 Units Subcutaneous Q12H  . hydrocortisone  10 mg Per Tube BID  . insulin aspart  0-9 Units Subcutaneous 6 times per day  . ipratropium-albuterol  3 mL Nebulization QID   . lidocaine  1 patch Transdermal Q24H  . megestrol  400 mg Oral BID  . midodrine  5 mg Per Tube TID WC  . multivitamin  5 mL Oral Daily  . nystatin   Topical TID  . pantoprazole sodium  40 mg Per Tube Daily  . sodium chloride  10-40 mL Intracatheter Q12H  . sodium hypochlorite   Irrigation BID  . sulfamethoxazole-trimethoprim  2.5 tablet Per Tube q1800  . thiamine IV  100 mg Intravenous Daily   sodium chloride, sodium chloride, acetaminophen (TYLENOL) oral liquid 160 mg/5 mL, HYDROmorphone (DILAUDID) injection, loperamide, morphine injection, oxyCODONE, sodium chloride  Assessment/ Plan:  49 y.o. 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 Admitted on 08-16-15  1. End-stage renal disease on hemodialysis on HD MWF. The patient has been on dialysis since October of 2014. R IJ permcath.  Tolerated dialysis treatment with IV albumin for oncotic support.  - Plan on continuing hemodialysis on MWF schedule. Next treatment for later today.  - midodine and albumin with treatment.  Also getting scheduled hydrocortisone.   2. Anemia of CKD: pt continues to require periodic blood transfusions, hemoglobin of 7.2 from 11/7 - continue epogen with HD. This has been ordered as a standing order.  Multiple transfusions during hospitalization  3. Secondary hyperparathyroidism: PTH low at 55 - phos was acceptable at 3.3 - no  binders  4. Sepsis: VRE in blood. Bone cultures E. Coli and klebsiella - Treatment as per ID and ICU team: daptomycin to be completed on 11/10 and now started on trimethoprim/sulfamethoxazole.   5. Generalized Dependent edema/Anasarca due to 3rd spacing - UF with dialysis with albumin IV and midodrine , as tolerated  6. Acute resp failure - reintubated 09/25/15, extubated on 11/10 - Appreciate pulmonary input.    LOS: 49 Courtney [name] 11/11/20168:12 AM

## 2015-09-30 NOTE — Progress Notes (Signed)
   09/30/15 0900  Clinical Encounter Type  Visited With Patient;Health care provider  Visit Type Follow-up;Spiritual support  Consult/Referral To Chaplain  Spiritual Encounters  Spiritual Needs Emotional;Other (Comment)  Stress Factors  Patient Stress Factors None identified  Chaplain rounded in unit and went in with patient while she was finishing up a treatment. Sat with her, held her hand, talked with her and sang songs. She was attentive to me and stared into my eyes and at times shook her head from left to right. I asked what that meant but did not get a response. I continued to offer the minsitry of presence until she dozed off while I sang.   Chaplain Arlina Sabina A. Kahealani Yankovich Ext. [phone]

## 2015-09-30 NOTE — Progress Notes (Signed)
[facility] Critical Care Medicine Progess Note  Name: Courtney [name] MRN: [id number] DOB: 11-10-1966    ADMISSION DATE:  09/08/15     INITIAL PRESENTATION:  49 AAF who has been in medical facilities (hosp, LTAC, rehab) for 2 yrs following gastric bypass surgery with multiple complications. Now with chronic trach, ESRD, profound debilitation, severe sacral pressure ulcer. Was seemingly making progress and transferred to rehab facility approx one week prior to this admission. She was sent to [facility] ED with AMS and hypotension. Working dx of severe sepsis/septic shock due to infected sacral pressure ulcer. Since admission. Her course is been very complicated with numerous complications including septic shock and GI bleeding. Currently, she has an NG tube in place, she takes in minimal by mouth nutrition, though she appears to be able to do this, however, she is refusing to eat. Patient with recurrent sepsis  INTERVAL HISTORY: Placed on vasopressors and placed back on vent 11/5, but was able to wean off levophed on 11/6 afternoon.  [name] changed out due to cuff leak on  11/5 PM, #6 Shiley cuffed  The patient was started on Megace and thiamine, on 09/22/2015 per the husband's request.  11/7>>husband at bedside updated on current plan. Plan for HD today, cont with TF, cont with daptomycin, awaiting bone cx and s/s from trach aspirate. Pressure in pilot balloon of trach in the 50s, if cont to be this high, might need to upsize trach.    11/8> no acute events overnight, mild thin secretions noted, was on the vent at 35% FiO2 during the night. Creatinine slightly worse compared to yesterday; had dialysis yesterday, 1 L removed. Sacral Bone culture shows Klebsiella pneumonia and Escherichia coli  11/9> placed on ventilator rate last night, today tolerating dialysis, goal removal is 1 L, we'll attempt trach collar trials today, patient more interactive, will open eyes and nod. Patient is  currently on Septra and daptomycin.  11/10>tolerated about 8 hrs off TCT yesterday, placed back on vent overnight, somnolent this morning but more alert as the day progresses.  HD with 1 liter removed yesterday, BP stable, not requiring pressors. RT report inc secretion, will obtain CXR and labs today.   11/11>> patient scheduled for dialysis today, tolerated trach collar trials overnight, and still on trach collar trials. Chest x-ray with mild right lower lobe atelectasis, white cell count stable, patient interactive and awake, currently being treated with Septra for bone and tissue infection.  MAJOR EVENTS/TEST RESULTS: Admission 02/07/14-05/07/14 Admission 07/21/14-09/06/14 Discharged to [facility]. Pt had palliative consult at that time, were asked to sign off by husband.  09/23 CT head: NAD 09/23 EEG: no epileptiform activity 09/23 PRBCs for Hgb 6.4 09/24 bedside debridement of sacral wound. Abscess drained 09/25 Off vasopressors. More alert. No distress. Worsening thrombocytopenia. Vanc DC'd 09/29  Dr. Sampson Goon (I.D) excused from the case by patient's husband. 10/02 MRI -multiple infarcts 10/03 tracheal bleeding- transfused platelets 10/03 hospitalist service excused from the case by patient's husband 10/03 Echocardiogram ejection fraction was 55-60%, pulmonary systolic pressure was 39 mmHg 40/98 restart TF's at lower rate, attempt reg HD  10/12 Transferred to med-surg floor. Remains on PCCM service 10/14 SLP eval: pt unable to tolerate PMV adequately 10/17-will re-attempt PM valve-discussed with Speech therapist 10/18 passed swallow eval-start pureed thick foods no thin liquids-continue NG feeds 10/19 transferred to step down for sepsis/aspiration pneumonia 10/19 cxr shows RLL opacity 10/21 sacral decub debride at bedside by surgery 10/22 started back on vasopressors while on HD 10/27 CT  with osteo, R hip fx, unable to identify tip of dubhoff tube - sent for fluoro study 10/28 Ortho  consultation: I do not feel that she is a surgical candidate. Therefore, I feel that it would best to manage this fracture nonsurgically and allow it to heal by itself over time, which it should.  10/28 Gen Surg consultation: Due to the lack of free air or free flow of contrast and the peritoneum there is no indication for any surgical intervention on this. Would recommend pulling back the feeding tube 1-2 cm. Would recheck an abdominal film to confirm no pneumoperitoneum in the morning. Absence of any changes okay to continue using Dobbhoff for feeding and medications. 10/28 gastrograffin study: The study confirms that the feeding tube pes perforated through the duodenum and the tip is within a cavity that fills with injected contrast. 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 Pneumoniae, P. Mirabilis, VRE Wound 9/26 >> No growth Blood 9/23 >> NEG CDiff 9/27>>neg Stool Cx 10/15>> negative Cdiff 10/25>>neg Trach Aspirate 10/28>> light growth pseudomonas Blood 10/28 >> 1/2 GPC >>  Sputum cultures obtained 09/22/15 due to mucus plugging>>Pseudomonas BONE TISSUE Cx 11/3>>E.coli (ESBL), k. Pneumonia (daptomycin and septra)  ANTIMICROBIALS:  Aztreonam 9/23 >> 9/24 Vanc 9/23 >> 9/25 Vanc 9/26>>9/27 Daptomycin  9/27>> 10/12, 10/28>>  Meropenem 9/23 >> 10/14, 10/28 >> 10/31 Septra 11/8>>   VITAL SIGNS: Temp:  [98.3 F (36.8 C)-98.9 F (37.2 C)] 98.7 F (37.1 C) (11/11 0741) Pulse Rate:  [97-116] 113 (11/11 0810) Resp:  [13-32] 26 (11/11 0810) BP: (108-141)/(55-88) 141/88 mmHg (11/11 0800) SpO2:  [90 %-100 %] 90 % (11/11 0810) FiO2 (%):  [28 %-30 %] 28 % (11/11 0829) Weight:  [218 lb 4.1 oz (99 kg)-227 lb 1.2 oz (103 kg)] 218 lb 4.1 oz (99 kg) (11/11 0500) HEMODYNAMICS:   VENTILATOR SETTINGS: Vent Mode:  [-] PRVC FiO2 (%):  [28 %-30 %] 28 % Set Rate:  [15 bmp] 15 bmp Vt Set:  [500 mL] 500 mL PEEP:  [5 cmH20] 5 cmH20 Plateau Pressure:  [15 cmH20] 15 cmH20 INTAKE / OUTPUT:  Intake/Output Summary (Last 24 hours) at 09/30/15 0918 Last data filed at 09/30/15 0800  Gross per 24 hour  Intake   1660 ml  Output      0 ml  Net   1660 ml    PHYSICAL EXAMINATION: Physical Examination:   VS: BP 141/88 mmHg  Pulse 113  Temp(Src) 98.7 F (37.1 C) (Oral)  Resp 26  Ht 5\' 9"  (1.753 m)  Wt 218 lb 4.1 oz (99 kg)  BMI 32.22 kg/m2  SpO2 90%  LMP  (LMP Unknown)  General Appearance: No resp distress, coarse upper airway sounds, on the vent Neuro: profoundly diffusely weak, alert today, nodding head HEENT: cushingoid facies, PERRLA, EOM intact Neck: trach site clean Pulmonary: clear anteriorly Cardiovascular: reg, + syst murmur Abdomen: soft,  NT, +BS Extremities: warm, R>L UE edema Examination of the sacral decubitus ulcer- showed a very deep ulcer approximately 7-8 cm across.   stage IV ulcer.   LABORATORY PANEL:   CBC  Recent Labs Lab 09/30/15 0522  WBC 8.6  HGB 8.3*  HCT 26.3*  PLT 143*    Chemistries   Recent Labs Lab 09/26/15 1100 09/29/15 1210  NA 139 141  K 3.5 3.3*  CL 103 104  CO2 31 33*  GLUCOSE 150* 139*  BUN 69* 48*  CREATININE 1.51* 0.99  CALCIUM 7.8* 8.0*  PHOS 3.2  --      CXR: NAD   IMPRESSION/PLAN: PULMONARY A: Chronic trach  dependence-placed back on vent due to resp acidosis Trach changed 11/5 to #6 Shiley History of recurrent hypercarbic resp failure History of DVT and pulmonary embolism History of obstructive sleep apnea P:  Continue every 4 hours DuoNeb's with Mucomyst and frequent suctioning. BMET intermittently Monitor I/Os Correct electrolytes as indicated Intermittent HD per Renal Service (MWF) -plan to wean off vent as tolerated, goal TCT today is 12 hrs -check CXR today and labs (CBC, BMP, ABG).   GASTROINTESTINAL A:  LGIB, resolved Hx of C. Diff Stool incontinence  P:  Cont PPI - if patient able to tolerate TCT for a full 24hrs then restart tube feeds on TCT at 50cc.  Her goal TF ist 65 cc/hr, maybe we can get there in the next 2-3 days.  -will not be able to take oral feeds as she is on/off the vent  HEMATOLOGIC A:  Acute on chronic anemia S/p Thrombocytopenia - HIT panel negative 9/25, resolved P: DVT px: SCDs + SQ heparin Monitor CBC intermittently Transfuse per usual ICU guidelines  INFECTIOUS A:  Recurrent Severe sepsis, due to sacral decub, currently stable.  Sacral decubitus ulcer with abscess - on Septra and daptomycin ID actively following along, appreciate recs - currently on daptomycin & septra  P:  Monitor temp, WBC count Micro and abx as above  ENDOCRINE A:  -DM 2, controlled. The patient developed some hypoglycemia when the tube feed was stopped and therefore she was started on D10 because she did not take in an adequate amount of calories to maintain her blood glucose levels in the normal range.  -Chronic steroid therapy, for a history of of adrenal insufficiency. P:  Monitor CBGs q 8 hrs - resume SSI if > 180 Cont hydrocortisone @ 10 mg BID Continue  tube feeds. We'll need to monitor blood glucose levels.  MSK A: Right hip fx - small intertrochanteric R fx - pain management. Ortho evaluated and felt not a surgical candidate at this time.     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 Belia Heman and other members of the medical team has meet and discussed prognosis with patient's husband on 09/25/15 - Per Dr. Belia Heman "I recommend Hospice and comfort care measures-patient with evidence of pain and suffering." - Per case manager: "Husband will be speaking with close family members about poor prognosis. He will attempt to obtain responses from patient dealing with her wish for continued care in the presence of bedside nurses." - at this time, continue with current medical care  I have personally obtained a history, examined the patient, evaluated Pertinent laboratory and [employer]/imaging results, and  formulated the assessment and plan  The Patient requires high complexity  decision making for assessment and support, frequent evaluation and titration of therapies, application of advanced monitoring technologies and extensive interpretation of multiple databases. Critical Care Time devoted to patient care services described in this note is 45 minutes.   Overall, patient is critically ill, prognosis is guarded.  Patient with Multiorgan failure and at high risk for cardiac arrest and death. prognosis is very poor, patient with very poor chance of meaningful recovery. Patient with extensive osteomyelitis of the sacrum/coccyx/T8-T9 spine, will need PEG tube for long term.    Stephanie Acre, MD [facility] Pager [phone] (please enter 7-digits) On Call Pager - [phone] (please enter 7-digits)

## 2015-09-30 NOTE — Care Management (Signed)
Patient tolerated being off vent for approx 8 hours but placed back on for overnight.  Awaiting update from administration regarding the ethics consult that occurred earlier in the week.  Have updated [facility] casemanager

## 2015-09-30 NOTE — Progress Notes (Signed)
Physical Therapy Treatment Patient Details Name: Courtney [name] MRN: [id number] DOB: 1965-12-05 Today's Date: 09/30/2015    History of Present Illness Pt is a [age] F who has been in medical facilities (hosp, LTAC, rehab) for 2 yrs following gastric bypass surgery with multiple complications. Now with chronic trach, ESRD, profound debilitation, severe sacral pressure ulcer (stage IV). Was seemingly making progress and transferred to rehab facility approx one week prior to this admission. She was [facility] ED 9/23 with AMS and hypotension. Admitted with sepsis related to infected sacral ulcer.  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  Patient transferred to CCU to med-surg floor during hospitalization, but experienced profound interdialytic hypotension requiring return transfer to CCU for use of pressors (now off); remains in CCU.  Noted with R hip intertrochanteric fracture, recommended for conservative management per orthopedics.  Hospital course continues to be complicated with patient requiring return to vent 11/5 for respiratory decline with use of pressors (now off); extubated to trach collar 11/10; intermittently following commands.  Per care team, physicians recommending hospice care/comfort measures at this time; husband considering goals of care/POC pending discussion with patient and other family members, but wishes continued medical care at this time. Will carefully monitor medical plan and POC to evaluate appropriateness of continued PT throughout remaining hospitalization.    PT Comments    Patient with increased alertness this date, appropriately nodding yes/no throughout session.  Does actively participate with 2-3 reps of L UE therex as able, but begins shaking head as if disinterested midway throughout session.  Occasional  grimacing with movement of L LE, limited to pain-free ROM.  Repositioned to comfort with bilat UEs elevated on pillows end of session.   Follow Up Recommendations  LTACH;SNF     Equipment Recommendations       Recommendations for Other Services       Precautions / Restrictions Precautions Precautions: Fall Precaution Comments: stage 4 sacral ulcer, NG tube/NPO, trach, contact isolation, R IJ permcath, L IJ central line, log roll only (as needed for dressing changes/hygiene) and no ROM to R LE/no attempts at sitting Restrictions Weight Bearing Restrictions: Yes RLE Weight Bearing: Non weight bearing    Mobility  Bed Mobility                  Transfers                    Ambulation/Gait                 Stairs            Wheelchair Mobility    Modified Rankin (Stroke Patients Only)       Balance                                    Cognition Arousal/Alertness: Awake/alert Behavior During Therapy: Flat affect                        Exercises Other Exercises Other Exercises: Passive ROM to bilat UEs, 1x12: gross grasp/release, wrist flex/ext, forearm pronation/supination, elbow flex/ext, shoulder IR/ER and UE D1/D2 PNF patterns.  Patient spontaneously moving L UE with at least 3-/5 strength at times; did participate actively with 2-3 reps of L UE therex this date.  Other Exercises: Passive ROM to L LE, 1x12: ankle pumps, ankle circumduction, hip/knee flex/ext, hip abduct/adduct.  No active movement noted L LE this date.    General Comments        Pertinent Vitals/Pain Faces Pain Scale: Hurts little more Pain Location: occasional grimacing with movement/fatigue; unable to localize, describe Pain Descriptors / Indicators: Grimacing Pain Intervention(s): Limited activity within patient's tolerance;Monitored during session;Repositioned    Home Living                      Prior Function            PT  Goals (current goals can now be found in the care plan section) Acute Rehab PT Goals Patient Stated Goal: patient unable to verbalize/state goal PT Goal Formulation: Patient unable to participate in goal setting Time For Goal Achievement: 10/06/15 Potential to Achieve Goals: Poor Progress towards PT goals: Not progressing toward goals - comment (limited by medical status)    Frequency  Min 2X/week    PT Plan Current plan remains appropriate    Co-evaluation             End of Session   Activity Tolerance: Patient limited by pain Patient left: in bed;with call bell/phone within reach     Time: 0856-0919 PT Time Calculation (min) (ACUTE ONLY): 23 min  Charges:  $Therapeutic Exercise: 23-37 mins                    G Codes:      Lyndle Pang H. Manson Passey, PT, DPT, NCS 09/30/2015, 10:11 AM [phone]

## 2015-10-01 LAB — CULTURE, RESPIRATORY W GRAM STAIN

## 2015-10-01 LAB — GLUCOSE, CAPILLARY
GLUCOSE-CAPILLARY: 113 mg/dL — AB (ref 65–99)
Glucose-Capillary: 118 mg/dL — ABNORMAL HIGH (ref 65–99)
Glucose-Capillary: 125 mg/dL — ABNORMAL HIGH (ref 65–99)
Glucose-Capillary: 116 mg/dL — ABNORMAL HIGH (ref 65–99)
Glucose-Capillary: 131 mg/dL — ABNORMAL HIGH (ref 65–99)
Glucose-Capillary: 127 mg/dL — ABNORMAL HIGH (ref 65–99)

## 2015-10-01 LAB — CULTURE, RESPIRATORY: SPECIAL REQUESTS: NORMAL

## 2015-10-01 MED ORDER — ONDANSETRON HCL 4 MG/2ML IJ SOLN
4.0000 mg | Freq: Four times a day (QID) | INTRAMUSCULAR | Status: DC | PRN
  Administered 2015-10-19 – 2015-11-27 (×8): 4 mg via INTRAVENOUS
  Filled 2015-10-01 (×8): qty 2

## 2015-10-01 NOTE — Progress Notes (Signed)
Aerosol bottle changed. Emptied drain bag.

## 2015-10-01 NOTE — Progress Notes (Signed)
[facility] Progess Note  Name: Courtney GrinderHedda McMillian Monroe MRN: [id number] DOB: 08-21-1966    ADMISSION DATE:  07/24/2015     INITIAL PRESENTATION:  49 AAF who has been in medical facilities (hosp, LTAC, rehab) for 2 yrs following gastric bypass surgery with multiple complications. Now with chronic trach, ESRD, profound debilitation, severe sacral pressure ulcer. Was seemingly making progress and transferred to rehab facility approx one week prior to this admission. [facility] with [facility] and hypotension. Working dx of severe sepsis/septic shock due to infected sacral pressure ulcer. Since admission. Her course is been very complicated with numerous complications including septic shock and GI bleeding. Currently, she has an NG tube in place, she takes in minimal by mouth nutrition, though she appears to be able to do this, however, she is refusing to eat. Patient with recurrent sepsis  INTERVAL HISTORY: Placed on vasopressors and placed back on vent 11/5, but was able to wean off levophed on 11/6 afternoon.  [name] changed out due to cuff leak on  11/5 PM, #6 Shiley cuffed  The patient was started on Megace and thiamine, on 09/22/2015 per the husband's request.  11/7>>husband at bedside updated on current plan. Plan for HD today, cont with TF, cont with daptomycin, awaiting bone cx and s/s from trach aspirate. Pressure in pilot balloon of trach in the 50s, if cont to be this high, might need to upsize trach.    11/8> no acute events overnight, mild thin secretions noted, was on the vent at 35% FiO2 during the night. Creatinine slightly worse compared to yesterday; had dialysis yesterday, 1 L removed. Sacral Bone culture shows Klebsiella pneumonia and Escherichia coli  11/9> placed on ventilator rate last night, today tolerating dialysis, goal removal is 1 L, we'll attempt trach collar trials today, patient more interactive, will open eyes and nod. Patient is  currently on Septra and daptomycin.  11/10>tolerated about 8 hrs off TCT yesterday, placed back on vent overnight, somnolent this morning but more alert as the day progresses.  HD with 1 liter removed yesterday, BP stable, not requiring pressors. RT report inc secretion, will obtain CXR and labs today.   MAJOR EVENTS/TEST RESULTS: Admission 02/07/14-05/07/14 Admission 07/21/14-09/06/14 Discharged to [facility]. Pt had palliative consult at that time, were asked to sign off by husband.  09/23 CT head: NAD 09/23 EEG: no epileptiform activity 09/23 PRBCs for Hgb 6.4 09/24 bedside debridement of sacral wound. Abscess drained 09/25 Off vasopressors. More alert. No distress. Worsening thrombocytopenia. Vanc DC'd 09/29  Dr. [name] (I.D) excused from the case by patient's husband. 10/02 MRI -multiple infarcts 10/03 tracheal bleeding- transfused platelets 10/03 hospitalist service excused from the case by patient's husband 10/03 Echocardiogram ejection fraction was 55-60%, pulmonary systolic pressure was 39 mmHg 78/2910/08 restart TF's at lower rate, attempt reg HD  10/12 Transferred to med-surg floor. Remains on PCCM service 10/14 SLP eval: pt unable to tolerate PMV adequately 10/17-will re-attempt PM valve-discussed with Speech therapist 10/18 passed swallow eval-start pureed thick foods no thin liquids-continue NG feeds 10/19 transferred to step down for sepsis/aspiration pneumonia 10/19 cxr shows RLL opacity 10/21 sacral decub debride at bedside by surgery 10/22 started back on vasopressors while on HD 10/27 CT with osteo, R hip fx, unable to identify tip of dubhoff tube - sent for fluoro study 10/28 Ortho consultation: I do not feel that she is a surgical candidate. Therefore, I feel that it would best to manage this fracture nonsurgically and allow  it to heal by itself over time, which it should.  10/28 Gen Surg consultation: Due to the lack of free air or free flow of contrast and the peritoneum  there is no indication for any surgical intervention on this. Would recommend pulling back the feeding tube 1-2 cm. Would recheck an abdominal film to confirm no pneumoperitoneum in the morning. Absence of any changes okay to continue using Dobbhoff for feeding and medications. 10/28 gastrograffin study: The study confirms that the feeding tube pes perforated through the duodenum and the tip is within a cavity that fills with injected contrast. 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 Pneumoniae, P. Mirabilis, VRE Wound 9/26 >> No growth Blood 9/23 >> NEG CDiff 9/27>>neg Stool Cx 10/15>> negative Cdiff 10/25>>neg Trach Aspirate 10/28>> light growth pseudomonas Blood 10/28 >> 1/2 GPC >>  Sputum cultures obtained 09/22/15 due to mucus plugging>>Pseudomonas BONE TISSUE Cx 11/3>>E.coli (ESBL), k. Pneumonia (daptomycin and septra)  ANTIMICROBIALS:  Aztreonam 9/23 >> 9/24 Vanc 9/23 >> 9/25 Vanc 9/26>>9/27 Daptomycin 9/27>> 10/12, 10/28>>  Meropenem 9/23 >> 10/14, 10/28 >> 10/31 Septra 11/8>>   VITAL SIGNS: Temp:  [97.9 F (36.6 C)-98.4 F (36.9 C)] 98.4 F (36.9 C) (11/12 1600) Pulse Rate:  [104-114] 109 (11/12 1900) Resp:  [14-29] 25 (11/12 1900) BP: (106-141)/(50-111) 123/50 mmHg (11/12 1900) SpO2:   [93 %-100 %] 99 % (11/12 1900) FiO2 (%):  [40 %] 40 % (11/12 1800) Weight:  [100 kg (220 lb 7.4 oz)] 100 kg (220 lb 7.4 oz) (11/12 0500) HEMODYNAMICS:   VENTILATOR SETTINGS: Vent Mode:  [-]  FiO2 (%):  [40 %] 40 % INTAKE / OUTPUT:  Intake/Output Summary (Last 24 hours) at 10/01/15 2013 Last data filed at 10/01/15 1900  Gross per 24 hour  Intake   1600 ml  Output   1004 ml  Net    596 ml    PHYSICAL EXAMINATION: Physical Examination:   VS: BP 123/50 mmHg  Pulse 109  Temp(Src) 98.4 F (36.9 C) (Oral)  Resp 25  Ht  (1.753 m)  Wt 100 kg (220 lb 7.4 oz)  BMI 32.54 kg/m2  SpO2 99%  LMP  (LMP Unknown)  General Appearance: No resp distress, coarse upper airway sounds, on the vent Neuro: profoundly diffusely weak, alert today, nodding head HEENT: cushingoid facies, PERRLA, EOM intact Neck: trach site clean Pulmonary: clear anteriorly Cardiovascular: reg, + syst murmur Abdomen: soft, NT, +BS Extremities: warm, R>L UE edema Examination of the sacral decubitus ulcer- showed a very deep ulcer approximately 7-8 cm across.   stage IV ulcer.   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 BMET intermittently Monitor I/Os Correct electrolytes as indicated Intermittent HD per Renal Service (MWF) -plan to wean off vent as tolerated, goal TCT today is 12 hrs -check CXR today and labs (CBC, BMP, ABG).    GASTROINTESTINAL A:  LGIB, resolved Hx of C. Diff Stool incontinence  P:  Cont PPI Resume TFs 10/30 goal now at 50 cc/hr -will not be able to take oral feeds as she is on the vent  HEMATOLOGIC A:  Acute on chronic anemia S/p Thrombocytopenia - HIT panel negative 9/25, resolved P: DVT px: SCDs + SQ heparin Monitor CBC intermittently Transfuse per usual ICU guidelines  INFECTIOUS A:  Recurrent Severe sepsis, due to sacral decub, currently stable.  Sacral decubitus ulcer with abscess - on Septra and daptomycin ID actively following along, appreciate recs - currently on daptomycin & septra  P:  Monitor temp, WBC count Micro and abx as above  ENDOCRINE A:  -DM 2, controlled. The patient developed some hypoglycemia when the tube feed was stopped and therefore she was started on D10 because she did not take in an adequate amount of calories to maintain her blood glucose levels in the normal range.  -Chronic steroid therapy, for a history of of adrenal insufficiency. P:  Monitor CBGs q 8 hrs - resume SSI if > 180 Cont hydrocortisone @ 10 mg BID Continue  tube feeds. We'll need to monitor blood glucose levels.  MSK A: Right hip fx - small intertrochanteric R fx - pain management. Ortho evaluated and felt not a surgical candidate at this time.   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 Belia HemanKasa and other members of the medical team has meet and discussed prognosis with patient's husband on 09/25/15 - Per Dr. Belia HemanKasa "I recommend Hospice and comfort care measures-patient with evidence of pain and suffering." - Per case manager: "Husband will be speaking with close family members about poor prognosis. He will attempt to obtain responses from patient dealing with her wish for continued care in the presence of  bedside nurses." - at this time, continue with current medical care  10/01/15 No distress Tolerating ATC Husband @ bedside. Exam without change  Billy Fischeravid Othella Slappey, MD PCCM service Mobile [phone] Pager [phone]

## 2015-10-02 LAB — GLUCOSE, CAPILLARY
GLUCOSE-CAPILLARY: 133 mg/dL — AB (ref 65–99)
GLUCOSE-CAPILLARY: 115 mg/dL — AB (ref 65–99)
GLUCOSE-CAPILLARY: 119 mg/dL — AB (ref 65–99)
Glucose-Capillary: 106 mg/dL — ABNORMAL HIGH (ref 65–99)
Glucose-Capillary: 129 mg/dL — ABNORMAL HIGH (ref 65–99)
Glucose-Capillary: 136 mg/dL — ABNORMAL HIGH (ref 65–99)

## 2015-10-02 NOTE — Progress Notes (Signed)
Called [facility] and reported to CCM Pt had 50 beat run on V-tach. CCM stated he would check through her chart for electrolytes and call back.

## 2015-10-02 NOTE — Progress Notes (Signed)
Called husband to give update on pt having a run of V-tach lasting approx. 4 secs. He stated thank you for keeping him informed. Passed episode along to night nurse Verlon AuLeslie. We will continue to watch her closely.

## 2015-10-02 NOTE — Plan of Care (Addendum)
Problem: Phase II Progression Outcomes Goal: Dyspnea controlled w/progressive activity Outcome: Not Met (add Reason) Pt had increased WOB and desating into 69% while passing a mucus plug. Pt was suctioned and O2 increased to 95%

## 2015-10-02 NOTE — Progress Notes (Signed)
[facility] Progess Note  Name: Courtney [name] MRN: [id number] DOB: 08-21-1966    ADMISSION DATE:  07/24/2015     INITIAL PRESENTATION:  49 AAF who has been in medical facilities (hosp, LTAC, rehab) for 2 yrs following gastric bypass surgery with multiple complications. Now with chronic trach, ESRD, profound debilitation, severe sacral pressure ulcer. Was seemingly making progress and transferred to rehab facility approx one week prior to this admission. [facility] with [facility] and hypotension. Working dx of severe sepsis/septic shock due to infected sacral pressure ulcer. Since admission. Her course is been very complicated with numerous complications including septic shock and GI bleeding. Currently, she has an NG tube in place, she takes in minimal by mouth nutrition, though she appears to be able to do this, however, she is refusing to eat. Patient with recurrent sepsis  INTERVAL HISTORY: Placed on vasopressors and placed back on vent 11/5, but was able to wean off levophed on 11/6 afternoon.  Courtney [name] changed out due to cuff leak on  11/5 PM, #6 Shiley cuffed  The patient was started on Megace and thiamine, on 09/22/2015 per the husband's request.  11/7>>husband at bedside updated on current plan. Plan for HD today, cont with TF, cont with daptomycin, awaiting bone cx and s/s from trach aspirate. Pressure in pilot balloon of trach in the 50s, if cont to be this high, might need to upsize trach.    11/8> no acute events overnight, mild thin secretions noted, was on the vent at 35% FiO2 during the night. Creatinine slightly worse compared to yesterday; had dialysis yesterday, 1 L removed. Sacral Bone culture shows Klebsiella pneumonia and Escherichia coli  11/9> placed on ventilator rate last night, today tolerating dialysis, goal removal is 1 L, we'll attempt trach collar trials today, patient more interactive, will open eyes and nod. Patient is  currently on Septra and daptomycin.  11/10>tolerated about 8 hrs off TCT yesterday, placed back on vent overnight, somnolent this morning but more alert as the day progresses.  HD with 1 liter removed yesterday, BP stable, not requiring pressors. RT report inc secretion, will obtain CXR and labs today.   MAJOR EVENTS/TEST RESULTS: Admission 02/07/14-05/07/14 Admission 07/21/14-09/06/14 Discharged to [facility]. Pt had palliative consult at that time, were asked to sign off by husband.  09/23 CT head: NAD 09/23 EEG: no epileptiform activity 09/23 PRBCs for Hgb 6.4 09/24 bedside debridement of sacral wound. Abscess drained 09/25 Off vasopressors. More alert. No distress. Worsening thrombocytopenia. Vanc DC'd 09/29  Dr. [name] (I.D) excused from the case by patient's husband. 10/02 MRI -multiple infarcts 10/03 tracheal bleeding- transfused platelets 10/03 hospitalist service excused from the case by patient's husband 10/03 Echocardiogram ejection fraction was 55-60%, pulmonary systolic pressure was 39 mmHg 78/2910/08 restart TF's at lower rate, attempt reg HD  10/12 Transferred to med-surg floor. Remains on PCCM service 10/14 SLP eval: pt unable to tolerate PMV adequately 10/17-will re-attempt PM valve-discussed with Speech therapist 10/18 passed swallow eval-start pureed thick foods no thin liquids-continue NG feeds 10/19 transferred to step down for sepsis/aspiration pneumonia 10/19 cxr shows RLL opacity 10/21 sacral decub debride at bedside by surgery 10/22 started back on vasopressors while on HD 10/27 CT with osteo, R hip fx, unable to identify tip of dubhoff tube - sent for fluoro study 10/28 Ortho consultation: I do not feel that she is a surgical candidate. Therefore, I feel that it would best to manage this fracture nonsurgically and allow  it to heal by itself over time, which it should.  10/28 Gen Surg consultation: Due to the lack of free air or free flow of contrast and the peritoneum  there is no indication for any surgical intervention on this. Would recommend pulling back the feeding tube 1-2 cm. Would recheck an abdominal film to confirm no pneumoperitoneum in the morning. Absence of any changes okay to continue using Dobbhoff for feeding and medications. 10/28 gastrograffin study: The study confirms that the feeding tube pes perforated through the duodenum and the tip is within a cavity that fills with injected contrast. 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 Pneumoniae, P. Mirabilis, VRE Wound 9/26 >> No growth Blood 9/23 >> NEG CDiff 9/27>>neg Stool Cx 10/15>> negative Cdiff 10/25>>neg Trach Aspirate 10/28>> light growth pseudomonas Blood 10/28 >> 1/2 GPC >>  Sputum cultures obtained 09/22/15 due to mucus plugging>>Pseudomonas BONE TISSUE Cx 11/3>>E.coli (ESBL), k. Pneumonia (daptomycin and septra)  ANTIMICROBIALS:  Aztreonam 9/23 >> 9/24 Vanc 9/23 >> 9/25 Vanc 9/26>>9/27 Daptomycin 9/27>> 10/12, 10/28>>  Meropenem 9/23 >> 10/14, 10/28 >> 10/31 Septra 11/8>>   VITAL SIGNS: Temp:  [98 F (36.7 C)-99.1 F (37.3 C)] 98 F (36.7 C) (11/13 1600) Pulse Rate:  [103-117] 106 (11/13 1800) Resp:  [12-34] 24 (11/13 1800) BP: (101-154)/(46-91) 111/49 mmHg (11/13 1800) SpO2:  [65  %-100 %] 91 % (11/13 1800) FiO2 (%):  [40 %] 40 % (11/13 1537) Weight:  [99 kg (218 lb 4.1 oz)] 99 kg (218 lb 4.1 oz) (11/13 0400) HEMODYNAMICS:   VENTILATOR SETTINGS: Vent Mode:  [-]  FiO2 (%):  [40 %] 40 % INTAKE / OUTPUT:  Intake/Output Summary (Last 24 hours) at 10/02/15 1859 Last data filed at 10/02/15 1800  Gross per 24 hour  Intake   1810 ml  Output      0 ml  Net   1810 ml    PHYSICAL EXAMINATION: Physical Examination:   VS: BP 111/49 mmHg  Pulse 106  Temp(Src) 98 F (36.7 C) (Oral)  Resp 24  Ht 5\' 9"  (1.753 m)  Wt 99 kg (218 lb 4.1 oz)  BMI 32.22 kg/m2  SpO2 91%  LMP  (LMP Unknown)  General Appearance: No resp distress, coarse upper airway sounds, on the vent Neuro: profoundly diffusely weak, alert today, nodding head HEENT: cushingoid facies, PERRLA, EOM intact Neck: trach site clean Pulmonary: clear anteriorly Cardiovascular: reg, + syst murmur Abdomen: soft, NT, +BS Extremities: warm, R>L UE edema Examination of the sacral decubitus ulcer- showed a very deep ulcer approximately 7-8 cm across.   stage IV ulcer.   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 BMET intermittently Monitor I/Os Correct electrolytes as indicated Intermittent HD per Renal Service (MWF) -plan to wean off vent as tolerated, goal TCT today is 12 hrs -check CXR today and labs (CBC, BMP, ABG).   GASTROINTESTINAL A:   LGIB, resolved Hx of C. Diff Stool incontinence  P:  Cont PPI Resume TFs 10/30 goal now at 50 cc/hr -will not be able to take oral feeds as she is on the vent  HEMATOLOGIC A:  Acute on chronic anemia S/p Thrombocytopenia - HIT panel negative 9/25, resolved P: DVT px: SCDs + SQ heparin Monitor CBC intermittently Transfuse per usual ICU guidelines  INFECTIOUS A:  Recurrent Severe sepsis, due to sacral decub, currently stable.  Sacral decubitus ulcer with abscess - on Septra and daptomycin ID actively following along, appreciate recs - currently on daptomycin & septra  P:  Monitor temp, WBC count Micro and abx as above  ENDOCRINE A:  -DM 2, controlled. The patient developed some hypoglycemia when the tube feed was stopped and therefore she was started on D10 because she did not take in an adequate amount of calories to maintain her blood glucose levels in the normal range.  -Chronic steroid therapy, for a history of of adrenal insufficiency. P:  Monitor CBGs q 8 hrs - resume SSI if > 180 Cont hydrocortisone @ 10 mg BID Continue  tube feeds. We'll need to monitor blood glucose levels.  MSK A: Right hip fx - small intertrochanteric R fx - pain management. Ortho evaluated and felt not a surgical candidate at this time.   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 Belia Heman and other members of the medical team has meet and discussed prognosis with patient's husband on 09/25/15 - Per Dr. Belia Heman "I recommend Hospice and comfort care measures-patient with evidence of pain and suffering." - Per case manager: "Husband will be speaking with close family members about poor prognosis. He will attempt to obtain responses from patient dealing with her wish for continued care in the presence of bedside nurses." - at this  time, continue with current medical care  10/02/15 No distress. More interactive. Complains of pain in sacral region. Denies dyspnea Exam unchanged Tolerating ATC Exam without change Husband @ bedside. He requests follow up from ID and neuro tomorrow (11/14) Cont current plan as above. CBC, BMET ordered for 11/14  Billy Fischer, MD PCCM service Mobile [phone] Pager [phone]

## 2015-10-02 NOTE — Progress Notes (Signed)
Physical Therapy Treatment Patient Details Name: Lisette GrinderHedda McMillian Monroe MRN: [id number] DOB: 16-Jun-1966 Today's Date: 10/02/2015    History of Present Illness Pt is a [age] F who has been in medical facilities (hosp, LTAC, rehab) for 2 yrs following gastric bypass surgery with multiple complications. Now with chronic trach, ESRD, profound debilitation, severe sacral pressure ulcer (stage IV). Was seemingly making progress and transferred to rehab facility approx one week prior to this admission. She was [facility]23 with AMS and hypotension. Admitted with sepsis related to infected sacral ulcer.  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  Patient transferred to CCU to med-surg floor during hospitalization, but experienced profound interdialytic hypotension requiring return transfer to CCU for use of pressors (now off); remains in CCU.  Noted with R hip intertrochanteric fracture, recommended for conservative management per orthopedics.  Hospital course continues to be complicated with patient requiring return to vent 11/5 for respiratory decline with use of pressors (now off); extubated to trach collar 11/10; intermittently following commands.  Per care team, physicians recommending hospice care/comfort measures at this time; husband considering goals of care/POC pending discussion with patient and other family members, but wishes continued medical care at this time. Will carefully monitor medical plan and POC to evaluate appropriateness of continued PT throughout remaining hospitalization.    PT Comments    Pt able to show some minimal effort with U&LE exercises, but remains with flat affect and needs heavy assist and encouragement to participate.   Pt is in a tough situation with function and multiple medical issues.  Follow Up  Recommendations  LTACH;SNF     Equipment Recommendations       Recommendations for Other Services       Precautions / Restrictions Restrictions RLE Weight Bearing: Non weight bearing    Mobility  Bed Mobility               General bed mobility comments: Deferred/contraindicated  Transfers                    Ambulation/Gait                 Stairs            Wheelchair Mobility    Modified Rankin (Stroke Patients Only)       Balance                                    Cognition Arousal/Alertness: Awake/alert Behavior During Therapy: Flat affect Overall Cognitive Status: Difficult to assess                      Exercises General Exercises - Upper Extremity Elbow Flexion: AAROM;PROM;Both;10 reps General Exercises - Lower Extremity Ankle Circles/Pumps: PROM;Left;15 reps Heel Slides: Left;PROM;10 reps;AAROM Hip ABduction/ADduction: PROM;AAROM;10 reps;Left    General Comments        Pertinent Vitals/Pain Pain Assessment:  (unable to rate, indicates pain with most movement)    Home Living                      Prior Function            PT Goals (current goals can now be found in the care plan section) Progress towards PT goals:  (  pt very limited in level of participation)    Frequency  Min 2X/week    PT Plan Current plan remains appropriate    Co-evaluation             End of Session   Activity Tolerance: Patient limited by lethargy Patient left: with family/visitor present;in bed     Time: 1610-9604 PT Time Calculation (min) (ACUTE ONLY): 27 min  Charges:  $Therapeutic Exercise: 8-22 mins                    G Codes:     Loran Senters, PT, DPT [id number]  Malachi Pro 10/02/2015, 4:02 PM

## 2015-10-03 ENCOUNTER — Inpatient Hospital Stay: Payer: 59

## 2015-10-03 LAB — CBC
HEMATOCRIT: 27.4 % — AB (ref 35.0–47.0)
HEMOGLOBIN: 8.5 g/dL — AB (ref 12.0–16.0)
MCH: 31.9 pg (ref 26.0–34.0)
MCHC: 30.9 g/dL — ABNORMAL LOW (ref 32.0–36.0)
MCV: 103.4 fL — ABNORMAL HIGH (ref 80.0–100.0)
Platelets: 183 10*3/uL (ref 150–440)
RBC: 2.65 MIL/uL — ABNORMAL LOW (ref 3.80–5.20)
RDW: 20.2 % — AB (ref 11.5–14.5)
WBC: 12.2 10*3/uL — AB (ref 3.6–11.0)

## 2015-10-03 LAB — GLUCOSE, CAPILLARY
GLUCOSE-CAPILLARY: 114 mg/dL — AB (ref 65–99)
GLUCOSE-CAPILLARY: 123 mg/dL — AB (ref 65–99)
GLUCOSE-CAPILLARY: 125 mg/dL — AB (ref 65–99)
GLUCOSE-CAPILLARY: 126 mg/dL — AB (ref 65–99)
Glucose-Capillary: 107 mg/dL — ABNORMAL HIGH (ref 65–99)
Glucose-Capillary: 115 mg/dL — ABNORMAL HIGH (ref 65–99)
Glucose-Capillary: 132 mg/dL — ABNORMAL HIGH (ref 65–99)

## 2015-10-03 LAB — BASIC METABOLIC PANEL
Anion gap: 7 (ref 5–15)
BUN: 82 mg/dL — AB (ref 6–20)
CHLORIDE: 103 mmol/L (ref 101–111)
CO2: 34 mmol/L — AB (ref 22–32)
CREATININE: 1.39 mg/dL — AB (ref 0.44–1.00)
Calcium: 8.3 mg/dL — ABNORMAL LOW (ref 8.9–10.3)
GFR calc Af Amer: 50 mL/min — ABNORMAL LOW (ref >60)
GFR, EST NON AFRICAN AMERICAN: 43 mL/min — AB (ref >60)
GLUCOSE: 149 mg/dL — AB (ref 65–99)
Potassium: 4.3 mmol/L (ref 3.5–5.1)
Sodium: 144 mmol/L (ref 135–145)

## 2015-10-03 LAB — MAGNESIUM: Magnesium: 1.7 mg/dL (ref 1.7–2.4)

## 2015-10-03 LAB — PHOSPHORUS: PHOSPHORUS: 4.2 mg/dL (ref 2.5–4.6)

## 2015-10-03 MED ORDER — MENTHOL-ZINC OXIDE 0.44-20.6 % EX OINT
1.0000 "application " | TOPICAL_OINTMENT | CUTANEOUS | Status: DC | PRN
  Administered 2015-10-04 – 2015-10-07 (×2): 1 via CUTANEOUS
  Filled 2015-10-03 (×3): qty 1

## 2015-10-03 MED ORDER — ALBUMIN HUMAN 25 % IV SOLN
25.0000 g | Freq: Once | INTRAVENOUS | Status: AC
  Administered 2015-10-03: 25 g via INTRAVENOUS
  Filled 2015-10-03: qty 100

## 2015-10-03 MED ORDER — LOPERAMIDE HCL 1 MG/5ML PO LIQD
2.0000 mg | Freq: Every day | ORAL | Status: DC
  Administered 2015-10-04 – 2015-10-17 (×14): 2 mg via ORAL
  Filled 2015-10-03 (×14): qty 10

## 2015-10-03 NOTE — Progress Notes (Signed)
[facility] Progess Note  Name: Courtney GrinderHedda McMillian [name] MRN: [id number] DOB: 1966-10-23    ADMISSION DATE:  08/11/2015     INITIAL PRESENTATION:  49 AAF who has been in medical facilities (hosp, LTAC, rehab) for 2 yrs following gastric bypass surgery with multiple complications. Now with chronic trach, ESRD, profound debilitation, severe sacral pressure ulcer. Was seemingly making progress and transferred to rehab facility approx one week prior to this admission. [facility] with A[facility] and hypotension. Working dx of severe sepsis/septic shock due to infected sacral pressure ulcer. Since admission. Her course is been very complicated with numerous complications including septic shock and GI bleeding. Had to be placed on vent due to excessive secretions  INTERVAL HISTORY: Placed on vasopressors and placed back on vent 11/5, but was able to wean off levophed on 11/6 afternoon.  Courtney [name] changed out due to cuff leak on  11/5 PM, #6 Shiley cuffed  The patient was started on Megace and thiamine, on 09/22/2015 per the husband's request.  11/7>>husband at bedside updated on current plan. Plan for HD today, cont with TF, cont with daptomycin, awaiting bone cx and s/s from trach aspirate. Pressure in pilot balloon of trach in the 50s, if cont to be this high, might need to upsize trach.    11/8> no acute events overnight, mild thin secretions noted, was on the vent at 35% FiO2 during the night. Creatinine slightly worse compared to yesterday; had dialysis yesterday, 1 L removed. Sacral Bone culture shows Klebsiella pneumonia and Escherichia coli  11/9> placed on ventilator rate last night, today tolerating dialysis, goal removal is 1 L, we'll attempt trach collar trials today, patient more interactive, will open eyes and nod. Patient is currently on Septra and daptomycin.  11/10>tolerated about 8 hrs off TCT yesterday, placed back on vent overnight, somnolent this  morning but more alert as the day progresses.  HD with 1 liter removed yesterday, BP stable, not requiring pressors. RT report inc secretion, will obtain CXR and labs today.  11/14 currently on TCT, complains of back pain   MAJOR EVENTS/TEST RESULTS: Admission 02/07/14-05/07/14 Admission 07/21/14-09/06/14 Discharged to [facility]. Pt had palliative consult at that time, were asked to sign off by husband.  09/23 CT head: NAD 09/23 EEG: no epileptiform activity 09/23 PRBCs for Hgb 6.4 09/24 bedside debridement of sacral wound. Abscess drained 09/25 Off vasopressors. More alert. No distress. Worsening thrombocytopenia. Vanc DC'd 09/29  Dr. [name] (I.D) excused from the case by patient's husband. 10/02 MRI -multiple infarcts 10/03 tracheal bleeding- transfused platelets 10/03 hospitalist service excused from the case by patient's husband 10/03 Echocardiogram ejection fraction was 55-60%, pulmonary systolic pressure was 39 mmHg 62/1310/08 restart TF's at lower rate, attempt reg HD  10/12 Transferred to med-surg floor. Remains on PCCM service 10/14 SLP eval: pt unable to tolerate PMV adequately 10/17-will re-attempt PM valve-discussed with Speech therapist 10/18 passed swallow eval-start pureed thick foods no thin liquids-continue NG feeds 10/19 transferred to step down for sepsis/aspiration pneumonia 10/19 cxr shows RLL opacity 10/21 sacral decub debride at bedside by surgery 10/22 started back on vasopressors while on HD 10/27 CT with osteo, R hip fx, unable to identify tip of dubhoff tube - sent for fluoro study 10/28 Ortho consultation: I do not feel that she is a surgical candidate. Therefore, I feel that it would best to manage this fracture nonsurgically and allow it to heal by itself over time, which it should.  10/28 Gen Surg consultation:  Due to the lack of free air or free flow of contrast and the peritoneum there is no indication for any surgical intervention on this. Would recommend  pulling back the feeding tube 1-2 cm. Would recheck an abdominal film to confirm no pneumoperitoneum in the morning. Absence of any changes okay to continue using Dobbhoff for feeding and medications. 10/28 gastrograffin study: The study confirms that the feeding tube pes perforated through the duodenum and the tip is within a cavity that fills with injected contrast. 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 Pneumoniae, P. Mirabilis, VRE Wound 9/26 >> No growth Blood 9/23 >> NEG CDiff 9/27>>neg Stool Cx 10/15>> negative Cdiff 10/25>>neg Trach Aspirate 10/28>> light growth pseudomonas Blood 10/28 >> 1/2 GPC >>  Sputum cultures obtained 09/22/15 due to mucus plugging>>Pseudomonas BONE TISSUE Cx 11/3>>E.coli (ESBL), k. Pneumonia (daptomycin and septra)  ANTIMICROBIALS:  Aztreonam 9/23 >> 9/24 Vanc 9/23 >> 9/25 Vanc 9/26>>9/27 Daptomycin 9/27>> 10/12, 10/28>>  Meropenem 9/23 >> 10/14, 10/28 >> 10/31 Septra 11/8>>   VITAL SIGNS: Temp:  [97.9 F (36.6 C)-98.9 F (37.2 C)] 98.9 F (37.2 C) (11/14 0800) Pulse Rate:  [91-117] 97 (11/14 1315) Resp:  [9-34] 19 (11/14 1315) BP: (101-146)/(48-95) 117/48 mmHg (11/14 1315) SpO2:  [65 %-100 %] 100 % (11/14 1130) FiO2 (%):  [35 %-40 %] 35 % (11/14  1130) Weight:  [220 lb 7.4 oz (100 kg)] 220 lb 7.4 oz (100 kg) (11/14 0400) HEMODYNAMICS:   VENTILATOR SETTINGS: Vent Mode:  [-]  FiO2 (%):  [35 %-40 %] 35 % INTAKE / OUTPUT:  Intake/Output Summary (Last 24 hours) at 10/03/15 1333 Last data filed at 10/03/15 1100  Gross per 24 hour  Intake   1650 ml  Output      0 ml  Net   1650 ml    PHYSICAL EXAMINATION: Physical Examination:   VS: BP 117/48 mmHg  Pulse 97  Temp(Src) 98.9 F (37.2 C) (Axillary)  Resp 19  Ht  (1.753 m)  Wt 220 lb 7.4 oz (100 kg)  BMI 32.54 kg/m2  SpO2 100%  LMP  (LMP Unknown)  General Appearance: No resp distress, coarse upper airway sounds, off vent, on TCT Neuro: profoundly diffusely weak, alert today, nodding head HEENT: cushingoid facies, PERRLA, EOM intact Neck: trach site clean Pulmonary: clear anteriorly Cardiovascular: reg, + syst murmur Abdomen: soft, NT, +BS Extremities: warm, R>L UE edema Examination of the sacral decubitus ulcer- showed a very deep ulcer approximately 7-8 cm across.   stage IV ulcer.   LABORATORY PANEL:   CBC  Recent Labs Lab 10/03/15 0412  WBC 12.2*  HGB 8.5*  HCT 27.4*  PLT 183    Chemistries   Recent Labs Lab 10/03/15 0412  NA 144  K 4.3  CL 103  CO2 34*  GLUCOSE 149*  BUN 82*  CREATININE 1.39*  CALCIUM 8.3*  MG 1.7  PHOS 4.2     CXR: NAD   IMPRESSION/PLAN: PULMONARY A: Chronic trach dependence-placed back on vent due to resp acidosis Trach changed 11/5 to #6 Shiley History of recurrent hypercarbic resp failure History of DVT and pulmonary embolism History of obstructive sleep apnea P:  Continue every 4 hours DuoNeb's with Mucomyst and frequent suctioning. BMET intermittently Monitor I/Os Correct electrolytes as indicated Intermittent HD per Renal Service (MWF) -plan to wean off vent as tolerated, goal TCT today is 24 hrs -check CXR today and labs (CBC, BMP, ABG).   GASTROINTESTINAL A:  LGIB, resolved Hx of C.  Diff Stool incontinence  P:  Cont PPI Resume TFs 10/30 goal now at 50 cc/hr -will not be able to take oral feeds as she is on the vent  HEMATOLOGIC A:  Acute on chronic anemia S/p Thrombocytopenia - HIT panel negative 9/25, resolved P: DVT px: SCDs + SQ heparin Monitor CBC intermittently Transfuse per usual ICU guidelines  INFECTIOUS A:  Recurrent Severe sepsis, due to sacral decub, currently stable.  Sacral decubitus ulcer with abscess - on Septra and daptomycin ID actively following along, appreciate recs - currently on daptomycin & septra  P:  Monitor temp, WBC count Micro and abx as above  ENDOCRINE A:  -DM 2, controlled. The patient developed some hypoglycemia when the tube feed was stopped and therefore she was started on D10 because she did not take in an adequate amount of calories to maintain her blood glucose levels in the normal range.  -Chronic steroid therapy, for a history of of adrenal insufficiency. P:  Monitor CBGs q 8 hrs - resume SSI if > 180 Cont hydrocortisone @ 10 mg BID Continue  tube feeds. We'll need to monitor blood glucose levels.  MSK A: Right hip fx - small intertrochanteric R fx - pain management. Ortho evaluated and felt not a surgical candidate at this time.   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 He is satisfied with plan of action today.  The Patient requires high complexity decision making for assessment and support, frequent evaluation and titration of therapies, application of advanced monitoring technologies and extensive interpretation of multiple databases. Critical Care  Time devoted to patient care services described in this note is 35 minutes.   Overall, patient is critically ill, prognosis is guarded.    Lucie Leather, M.D.  [facility]  Medical Director [facility] Medical Director [facility] Cardio-Pulmonary Department

## 2015-10-03 NOTE — Progress Notes (Signed)
Subjective:  Pt seen at bedside. Due for HD today. Orders have been prepared. No significant change in status.   Objective:  Vital signs in last 24 hours:  Temp:  [97.9 F (36.6 C)-99.1 F (37.3 C)] 97.9 F (36.6 C) (11/14 0000) Pulse Rate:  [91-117] 100 (11/14 0500) Resp:  [12-34] 15 (11/14 0500) BP: (101-154)/(46-83) 116/63 mmHg (11/14 0500) SpO2:  [65 %-100 %] 99 % (11/14 0500) FiO2 (%):  [35 %-40 %] 35 % (11/14 0321) Weight:  [100 kg (220 lb 7.4 oz)] 100 kg (220 lb 7.4 oz) (11/14 0400)  Weight change: 1 kg (2 lb 3.3 oz) Filed Weights   10/01/15 0500 10/02/15 0400 10/03/15 0400  Weight: 100 kg (220 lb 7.4 oz) 99 kg (218 lb 4.1 oz) 100 kg (220 lb 7.4 oz)    Intake/Output: I/O last 3 completed shifts: In: 2490 [I.V.:20; NG/GT:2470] Out: -    Intake/Output this shift:     Physical Exam: General: No acute distress, chronically ill appaering  Head: NG in place, eyes open  Eyes: anicteric  Neck: Tracheostomy in place  Lungs:  Scattered rhonchi, normal effort  Heart: S1S2 +tachycardia  Abdomen:  Soft, nontender, BS present  Extremities: + dependent edema, anasarca  Neurologic: Awake, squeezed my hand with left hand  Access:  R IJ permcath.       Basic Metabolic Panel:  Recent Labs Lab 09/26/15 1100 09/29/15 1210 09/30/15 1828 10/03/15 0412  NA 139 141 143 144  K 3.5 3.3* 4.2 4.3  CL 103 104 103 103  CO2 31 33* 33* 34*  GLUCOSE 150* 139* 130* 149*  BUN 69* 48* 71* 82*  CREATININE 1.51* 0.99 1.36* 1.39*  CALCIUM 7.8* 8.0* 8.4* 8.3*  PHOS 3.2  --  3.7  --     Liver Function Tests:  Recent Labs Lab 09/26/15 1100 09/30/15 1828  ALBUMIN 1.7* 1.8*   No results for input(s): LIPASE, AMYLASE in the last 168 hours. No results for input(s): AMMONIA in the last 168 hours.  CBC:  Recent Labs Lab 09/29/15 1210 09/30/15 0522 09/30/15 1828 10/03/15 0412  WBC 9.1 8.6 10.0 12.2*  HGB 6.9* 8.3* 9.1* 8.5*  HCT 22.5* 26.3* 28.5* 27.4*  MCV 104.6*  100.6* 100.3* 103.4*  PLT 128* 143* 171 183    Cardiac Enzymes:  Recent Labs Lab 09/27/15 1311  CKTOTAL 10*    BNP: Invalid input(s): POCBNP  CBG:  Recent Labs Lab 10/02/15 1702 10/02/15 1955 10/03/15 0012 10/03/15 0357 10/03/15 0735  GLUCAP 115* 119* 115* 125* 123*    Microbiology: Results for orders placed or performed during the hospital encounter of 10-Sep-2015  Blood Culture (routine x 2)     Status: None   Collection Time: 2015/09/10  8:51 AM  Result Value Ref Range Status   Specimen Description BLOOD [name]  Final   Special Requests BOTTLES DRAWN AEROBIC AND ANAEROBIC  3CC  Final   Culture NO GROWTH 5 DAYS  Final   Report Status 08/17/2015 FINAL  Final  Blood Culture (routine x 2)     Status: None   Collection Time: 09/10/15  9:20 AM  Result Value Ref Range Status   Specimen Description BLOOD LEFT ARM  Final   Special Requests BOTTLES DRAWN AEROBIC AND ANAEROBIC  1CC  Final   Culture NO GROWTH 5 DAYS  Final   Report Status 08/17/2015 FINAL  Final  Wound culture     Status: None   Collection Time: Sep 10, 2015  9:20 AM  Result Value  Ref Range Status   Specimen Description DECUBITIS  Final   Special Requests Normal  Final   Gram Stain   Final    FEW WBC SEEN MANY GRAM NEGATIVE RODS RARE GRAM POSITIVE COCCI    Culture   Final    HEAVY GROWTH ESCHERICHIA COLI MODERATE GROWTH ENTEROBACTER AEROGENES PROTEUS MIRABILIS HEAVY GROWTH ENTEROCOCCUS SPECIES VRE HAVE INTRINSIC RESISTANCE TO MOST COMMONLY USED ANTIBIOTICS AND THE ABILITY TO ACQUIRE RESISTANCE TO MOST AVAILABLE ANTIBIOTICS.    Report Status 08/16/2015 FINAL  Final   Organism ID, Bacteria ESCHERICHIA COLI  Final   Organism ID, Bacteria ENTEROBACTER AEROGENES  Final   Organism ID, Bacteria PROTEUS MIRABILIS  Final   Organism ID, Bacteria ENTEROCOCCUS SPECIES  Final      Susceptibility   Enterobacter aerogenes - MIC*    CEFTAZIDIME <=1 SENSITIVE Sensitive     CEFAZOLIN >=64 RESISTANT Resistant      CEFTRIAXONE <=1 SENSITIVE Sensitive     CIPROFLOXACIN <=0.25 SENSITIVE Sensitive     GENTAMICIN <=1 SENSITIVE Sensitive     IMIPENEM 1 SENSITIVE Sensitive     TRIMETH/SULFA <=20 SENSITIVE Sensitive     * MODERATE GROWTH ENTEROBACTER AEROGENES   Escherichia coli - MIC*    AMPICILLIN <=2 SENSITIVE Sensitive     CEFTAZIDIME <=1 SENSITIVE Sensitive     CEFAZOLIN <=4 SENSITIVE Sensitive     CEFTRIAXONE <=1 SENSITIVE Sensitive     CIPROFLOXACIN <=0.25 SENSITIVE Sensitive     GENTAMICIN <=1 SENSITIVE Sensitive     IMIPENEM <=0.25 SENSITIVE Sensitive     TRIMETH/SULFA <=20 SENSITIVE Sensitive     * HEAVY GROWTH ESCHERICHIA COLI   Proteus mirabilis - MIC*    AMPICILLIN >=32 RESISTANT Resistant     CEFTAZIDIME <=1 SENSITIVE Sensitive     CEFAZOLIN 8 SENSITIVE Sensitive     CEFTRIAXONE <=1 SENSITIVE Sensitive     CIPROFLOXACIN <=0.25 SENSITIVE Sensitive     GENTAMICIN <=1 SENSITIVE Sensitive     IMIPENEM 1 SENSITIVE Sensitive     TRIMETH/SULFA <=20 SENSITIVE Sensitive     * PROTEUS MIRABILIS   Enterococcus species - MIC*    AMPICILLIN >=32 RESISTANT Resistant     VANCOMYCIN >=32 RESISTANT Resistant     GENTAMICIN SYNERGY SENSITIVE Sensitive     TETRACYCLINE Value in next row Resistant      RESISTANT>=16    * HEAVY GROWTH ENTEROCOCCUS SPECIES  MRSA PCR Screening     Status: None   Collection Time: 07/29/2015  2:38 PM  Result Value Ref Range Status   MRSA by PCR NEGATIVE NEGATIVE Final    Comment:        The GeneXpert MRSA Assay (FDA approved for NASAL specimens only), is one component of a comprehensive MRSA colonization surveillance program. It is not intended to diagnose MRSA infection nor to guide or monitor treatment for MRSA infections.   Blood culture (single)     Status: None   Collection Time: 07/27/2015  3:36 PM  Result Value Ref Range Status   Specimen Description BLOOD RIGHT ASSIST CONTROL  Final   Special Requests BOTTLES DRAWN AEROBIC AND ANAEROBIC  Final    Culture NO GROWTH 5 DAYS  Final   Report Status 08/17/2015 FINAL  Final  Wound culture     Status: None   Collection Time: 08/13/15 12:37 PM  Result Value Ref Range Status   Specimen Description WOUND  Final   Special Requests Normal  Final   Gram Stain   Final  FEW WBC SEEN TOO NUMEROUS TO COUNT GRAM NEGATIVE RODS FEW GRAM POSITIVE COCCI    Culture   Final    HEAVY GROWTH ESCHERICHIA COLI MODERATE GROWTH PROTEUS MIRABILIS LIGHT GROWTH KLEBSIELLA PNEUMONIAE MODERATE GROWTH ENTEROCOCCUS GALLINARUM CRITICAL RESULT CALLED TO, READ BACK BY AND VERIFIED WITH: [facility] BORBA AT 1042 08/16/15 DV    Report Status 08/17/2015 FINAL  Final   Organism ID, Bacteria ESCHERICHIA COLI  Final   Organism ID, Bacteria PROTEUS MIRABILIS  Final   Organism ID, Bacteria KLEBSIELLA PNEUMONIAE  Final   Organism ID, Bacteria ENTEROCOCCUS GALLINARUM  Final      Susceptibility   Escherichia coli - MIC*    AMPICILLIN >=32 RESISTANT Resistant     CEFTAZIDIME 4 RESISTANT Resistant     CEFAZOLIN >=64 RESISTANT Resistant     CEFTRIAXONE 16 RESISTANT Resistant     GENTAMICIN 2 SENSITIVE Sensitive     IMIPENEM >=16 RESISTANT Resistant     TRIMETH/SULFA <=20 SENSITIVE Sensitive     Extended ESBL POSITIVE Resistant     PIP/TAZO Value in next row Resistant      RESISTANT>=128    CIPROFLOXACIN Value in next row Sensitive      SENSITIVE<=0.25    * HEAVY GROWTH ESCHERICHIA COLI   Klebsiella pneumoniae - MIC*    AMPICILLIN Value in next row Resistant      SENSITIVE<=0.25    CEFTAZIDIME Value in next row Resistant      SENSITIVE<=0.25    CEFAZOLIN Value in next row Resistant      SENSITIVE<=0.25    CEFTRIAXONE Value in next row Resistant      SENSITIVE<=0.25    CIPROFLOXACIN Value in next row Resistant      SENSITIVE<=0.25    GENTAMICIN Value in next row Sensitive      SENSITIVE<=0.25    IMIPENEM Value in next row Resistant      SENSITIVE<=0.25    TRIMETH/SULFA Value in next row Resistant       SENSITIVE<=0.25    PIP/TAZO Value in next row Resistant      RESISTANT>=128    * LIGHT GROWTH KLEBSIELLA PNEUMONIAE   Proteus mirabilis - MIC*    AMPICILLIN Value in next row Resistant      RESISTANT>=128    CEFTAZIDIME Value in next row Sensitive      RESISTANT>=128    CEFAZOLIN Value in next row Sensitive      RESISTANT>=128    CEFTRIAXONE Value in next row Sensitive      RESISTANT>=128    CIPROFLOXACIN Value in next row Sensitive      RESISTANT>=128    GENTAMICIN Value in next row Sensitive      RESISTANT>=128    IMIPENEM Value in next row Sensitive      RESISTANT>=128    TRIMETH/SULFA Value in next row Sensitive      RESISTANT>=128    PIP/TAZO Value in next row Sensitive      SENSITIVE<=4    * MODERATE GROWTH PROTEUS MIRABILIS   Enterococcus gallinarum - MIC*    AMPICILLIN Value in next row Resistant      SENSITIVE<=4    GENTAMICIN SYNERGY Value in next row Sensitive      SENSITIVE<=4    CIPROFLOXACIN Value in next row Resistant      RESISTANT>=8    TETRACYCLINE Value in next row Resistant      RESISTANT>=16    * MODERATE GROWTH ENTEROCOCCUS GALLINARUM  Wound culture     Status: None  Collection Time: 08/15/15  2:53 PM  Result Value Ref Range Status   Specimen Description WOUND  Final   Special Requests NONE  Final   Gram Stain FEW WBC SEEN NO ORGANISMS SEEN   Final   Culture NO GROWTH 3 DAYS  Final   Report Status 08/18/2015 FINAL  Final  C difficile quick scan w PCR reflex     Status: None   Collection Time: 08/17/15 11:34 AM  Result Value Ref Range Status   C Diff antigen NEGATIVE NEGATIVE Final   C Diff toxin NEGATIVE NEGATIVE Final   C Diff interpretation Negative for C. difficile  Final  Stool culture     Status: None   Collection Time: 09/03/15  3:51 PM  Result Value Ref Range Status   Specimen Description STOOL  Final   Special Requests Immunocompromised  Final   Culture   Final    NO SALMONELLA OR SHIGELLA ISOLATED No Pathogenic E. coli  detected NO CAMPYLOBACTER DETECTED    Report Status 09/07/2015 FINAL  Final  C difficile quick scan w PCR reflex     Status: None   Collection Time: 09/13/15 12:51 AM  Result Value Ref Range Status   C Diff antigen NEGATIVE NEGATIVE Final   C Diff toxin NEGATIVE NEGATIVE Final   C Diff interpretation Negative for C. difficile  Final  Culture, blood (routine x 2)     Status: None   Collection Time: 09/16/15 11:24 AM  Result Value Ref Range Status   Specimen Description BLOOD LEFT HAND  Final   Special Requests BOTTLES DRAWN AEROBIC AND ANAEROBIC  1CC  Final   Culture NO GROWTH 5 DAYS  Final   Report Status 09/21/2015 FINAL  Final  Culture, blood (routine x 2)     Status: None   Collection Time: 09/16/15 12:23 PM  Result Value Ref Range Status   Specimen Description BLOOD RIGHT HAND  Final   Special Requests BOTTLES DRAWN AEROBIC AND ANAEROBIC  1CC  Final   Culture  Setup Time   Final    GRAM POSITIVE COCCI AEROBIC BOTTLE ONLY CRITICAL RESULT CALLED TO, READ BACK BY AND VERIFIED WITH: TESS THOMAS,RN 09/17/2015 0631 BY JRS.    Culture   Final    ENTEROCOCCUS FAECALIS AEROBIC BOTTLE ONLY VRE HAVE INTRINSIC RESISTANCE TO MOST COMMONLY USED ANTIBIOTICS AND THE ABILITY TO ACQUIRE RESISTANCE TO MOST AVAILABLE ANTIBIOTICS. CRITICAL RESULT CALLED TO, READ BACK BY AND VERIFIED WITH: CHERYL SMITH AT 0102 09/19/15 DV    Report Status 09/21/2015 FINAL  Final   Organism ID, Bacteria ENTEROCOCCUS FAECALIS  Final      Susceptibility   Enterococcus faecalis - MIC*    AMPICILLIN <=2 SENSITIVE Sensitive     LINEZOLID 2 SENSITIVE Sensitive     CIPROFLOXACIN Value in next row Resistant      RESISTANT>=8    TETRACYCLINE Value in next row Resistant      RESISTANT>=16    VANCOMYCIN Value in next row Resistant      RESISTANT>=32    GENTAMICIN SYNERGY Value in next row Resistant      RESISTANT>=32    * ENTEROCOCCUS FAECALIS  Culture, respiratory (NON-Expectorated)     Status: None   Collection  Time: 09/16/15  3:50 PM  Result Value Ref Range Status   Specimen Description TRACHEAL ASPIRATE  Final   Special Requests Immunocompromised  Final   Gram Stain   Final    FEW WBC SEEN GOOD SPECIMEN - 80-90% WBCS RARE  GRAM NEGATIVE RODS    Culture LIGHT GROWTH PSEUDOMONAS AERUGINOSA  Final   Report Status 09/19/2015 FINAL  Final   Organism ID, Bacteria PSEUDOMONAS AERUGINOSA  Final      Susceptibility   Pseudomonas aeruginosa - MIC*    CEFTAZIDIME 8 SENSITIVE Sensitive     CIPROFLOXACIN 2 INTERMEDIATE Intermediate     GENTAMICIN >=16 RESISTANT Resistant     IMIPENEM >=16 RESISTANT Resistant     PIP/TAZO Value in next row Sensitive      SENSITIVE32    CEFEPIME Value in next row Sensitive      SENSITIVE8    LEVOFLOXACIN Value in next row Resistant      RESISTANT>=8    * LIGHT GROWTH PSEUDOMONAS AERUGINOSA  Culture, expectorated sputum-assessment     Status: None   Collection Time: 09/22/15  2:09 PM  Result Value Ref Range Status   Specimen Description ENDOTRACHEAL  Final   Special Requests Normal  Final   Sputum evaluation THIS SPECIMEN IS ACCEPTABLE FOR SPUTUM CULTURE  Final   Report Status 09/24/2015 FINAL  Final  Culture, respiratory (NON-Expectorated)     Status: None   Collection Time: 09/22/15  2:09 PM  Result Value Ref Range Status   Specimen Description ENDOTRACHEAL  Final   Special Requests Normal Reflexed from [id number]  Final   Gram Stain   Final    FEW WBC SEEN FEW GRAM NEGATIVE RODS POOR SPECIMEN - LESS THAN 70% WBCS    Culture   Final    MODERATE GROWTH PSEUDOMONAS AERUGINOSA LIGHT GROWTH KLEBSIELLA PNEUMONIAE REFER TO SENSITIVITIES FROM PREVIOUS CULTURE FOR ORG 2    Report Status 10/01/2015 FINAL  Final   Organism ID, Bacteria PSEUDOMONAS AERUGINOSA  Final   Organism ID, Bacteria KLEBSIELLA PNEUMONIAE  Final      Susceptibility   Pseudomonas aeruginosa - MIC*    CEFTAZIDIME 4 SENSITIVE Sensitive     CIPROFLOXACIN 2 INTERMEDIATE Intermediate      GENTAMICIN >=16 RESISTANT Resistant     IMIPENEM >=16 RESISTANT Resistant     PIP/TAZO Value in next row Sensitive      SENSITIVE16    * MODERATE GROWTH PSEUDOMONAS AERUGINOSA  Tissue culture     Status: None   Collection Time: 09/24/15  6:44 AM  Result Value Ref Range Status   Specimen Description BONE  Final   Special Requests Normal  Final   Gram Stain MODERATE WBC SEEN FEW GRAM NEGATIVE RODS   Final   Culture   Final    MODERATE GROWTH ESCHERICHIA COLI MODERATE GROWTH KLEBSIELLA PNEUMONIAE ESBL-EXTENDED SPECTRUM BETA LACTAMASE-THE ORGANISM IS RESISTANT TO PENICILLINS, CEPHALOSPORINS AND AZTREONAM ACCORDING TO CLSI [id number] VOL.25 N01 JAN 2005. ORGANISM 1 This organism isolate is resistant to one or more antiotic agents in three or more antimicrobial categories.  Suggest Infectious Disease consult.   ORGANISM 2 CRITICAL RESULT CALLED TO, READ BACK BY AND VERIFIED WITH: RN Mardene CelesteJOANNA LINDSAY 09/27/15 1005AM    Report Status 09/28/2015 FINAL  Final   Organism ID, Bacteria ESCHERICHIA COLI  Final   Organism ID, Bacteria KLEBSIELLA PNEUMONIAE  Final      Susceptibility   Escherichia coli - MIC*    AMPICILLIN >=32 RESISTANT Resistant     CEFTAZIDIME 4 RESISTANT Resistant     CEFAZOLIN >=64 RESISTANT Resistant     CEFTRIAXONE 8 RESISTANT Resistant     GENTAMICIN <=1 SENSITIVE Sensitive     IMIPENEM 8 RESISTANT Resistant     TRIMETH/SULFA <=20 SENSITIVE Sensitive  Extended ESBL POSITIVE Resistant     PIP/TAZO Value in next row Resistant      RESISTANT>=128    * MODERATE GROWTH ESCHERICHIA COLI   Klebsiella pneumoniae - MIC*    AMPICILLIN Value in next row Resistant      RESISTANT>=128    CEFTAZIDIME Value in next row Resistant      RESISTANT>=128    CEFAZOLIN Value in next row Resistant      RESISTANT>=128    CEFTRIAXONE Value in next row Resistant      RESISTANT>=128    CIPROFLOXACIN Value in next row Resistant      RESISTANT>=128    GENTAMICIN Value in next row Resistant       RESISTANT>=128    IMIPENEM Value in next row Resistant      RESISTANT>=128    TRIMETH/SULFA Value in next row Sensitive      RESISTANT>=128    PIP/TAZO Value in next row Resistant      RESISTANT>=128    * MODERATE GROWTH KLEBSIELLA PNEUMONIAE  Anaerobic culture     Status: None   Collection Time: 09/24/15  3:55 PM  Result Value Ref Range Status   Specimen Description BONE  Final   Special Requests Normal  Final   Culture NO ANAEROBES ISOLATED  Final   Report Status 09/29/2015 FINAL  Final    Coagulation Studies: No results for input(s): LABPROT, INR in the last 72 hours.  Urinalysis: No results for input(s): COLORURINE, LABSPEC, PHURINE, GLUCOSEU, HGBUR, BILIRUBINUR, KETONESUR, PROTEINUR, UROBILINOGEN, NITRITE, LEUKOCYTESUR in the last 72 hours.  Invalid input(s): APPERANCEUR    Imaging: No results found.   Medications:   . feeding supplement (VITAL AF 1.2 CAL) 1,000 mL (10/02/15 1059)   . albumin human  25 g Intravenous Once  . antiseptic oral rinse  7 mL Mouth Rinse q12n4p  . chlorhexidine  15 mL Mouth Rinse BID  . epoetin (EPOGEN/PROCRIT) injection  10,000 Units Intravenous Q M,W,F-HD  . feeding supplement (PRO-STAT SUGAR FREE 64)  30 mL Per Tube 6 times per day  . folic acid  1 mg Oral Daily  . free water  20 mL Per Tube 6 times per day  . heparin subcutaneous  5,000 Units Subcutaneous Q12H  . hydrocortisone  10 mg Per Tube BID  . insulin aspart  0-9 Units Subcutaneous 6 times per day  . ipratropium-albuterol  3 mL Nebulization QID  . lidocaine  1 patch Transdermal Q24H  . megestrol  400 mg Oral BID  . midodrine  5 mg Per Tube TID WC  . multivitamin  5 mL Oral Daily  . nystatin   Topical TID  . pantoprazole sodium  40 mg Per Tube Daily  . sodium chloride  10-40 mL Intracatheter Q12H  . sodium hypochlorite   Irrigation BID  . sulfamethoxazole-trimethoprim  2.5 tablet Per Tube q1800  . thiamine IV  100 mg Intravenous Daily   sodium chloride, sodium  chloride, acetaminophen (TYLENOL) oral liquid 160 mg/5 mL, HYDROmorphone (DILAUDID) injection, loperamide, morphine injection, ondansetron (ZOFRAN) IV, oxyCODONE, sodium chloride  Assessment/ Plan:  49 y.o. 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 Admitted on 08/23/15  1. End-stage renal disease on hemodialysis on HD MWF. The patient has been on dialysis since October of 2014. R IJ permcath.  Tolerated dialysis treatment with IV albumin for oncotic support.  - Patient due for hemodialysis today. Orders have been prepared. We will use low blood flow rate, Midrin, albumin for blood pressure support.  2. Anemia of CKD: Hemoglobin currently 8.5. In part dilutional. Continue Epogen 10,000 units with dialysis.  3. Secondary hyperparathyroidism: PTH low at 55 -Phosphorus has been under good control. Patient not on binders.   4. Sepsis: VRE in blood. Bone cultures E. Coli and klebsiella - Treatment as per ID and ICU team: daptomycin to be completed on 11/10 and now started on trimethoprim/sulfamethoxazole.   5. Generalized Dependent edema/Anasarca due to 3rd spacing - Continue to use albumin with dialysis.  6. Acute resp failure - reintubated 09/25/15, extubated on 11/10 -Currently off the ventilator.  LOS: 52 Dally Oshel 11/14/20167:41 AM

## 2015-10-03 NOTE — Care Management (Signed)
Patient has been off the vent since 11/11.  Patient is requiring more frequent suctioning and has experience increase in frequency of desatting during suctioning. She has had about a 60 beat run of V Tach.  magnesium and phosphorus, CXR will be checked today.  Patient is experiencing more pain and there has been an increase in frequency of administration of pain medications.  Her sacral wound is enlarging, more tunneling with what appears to be eschar within the tunneled areas.  Will have wound care nurse to reassess.  Tube feedings are at 50 cc/hr.  It is felt that on the days that patient receives immodium, "her stools improve."  Currently ordered at daily prn.  Discussed changing the order to daily.  Administration has spoken with the ethics committee regarding this case and CM informed  that it has been recommended that all involved physicians meet address futility of this care- ? with the ethics committee.  CM is assuming that administration is arranging this meeting and  have reached out to to Dr Randa LynnLamb and Mrs Tiburcio PeaHarris to  confirm.  Discussed during rounds that attending will need to readdress the need for Gtube / Peg tube with patient's husband.

## 2015-10-03 NOTE — Progress Notes (Signed)
   10/03/15 1400  Clinical Encounter Type  Visited With Patient and family together  Visit Type Follow-up  Consult/Referral To Chaplain  Spiritual Encounters  Spiritual Needs Emotional  Stress Factors  Patient Stress Factors Not reviewed  Family Stress Factors Other (Comment)  Chaplain rounded in the unit and offered a compassionate presence and support to the patient's out of town family. Chaplain Khalise Billard A. Pete Schnitzer Ext. [phone]

## 2015-10-03 NOTE — Progress Notes (Signed)
Nutrition Follow-up  DOCUMENTATION CODES:   Severe malnutrition in context of acute illness/injury  INTERVENTION:   EN: Spoke with MD, Kasa regarding nutrition poc. MD wanting to maintain current goal rate of 94mL/hr of Vital 1.2AF at this time as ordered. MD made aware of goal rate of 35mL/hr to meet nutritional needs of pt. Will continue to monitor and make adjustments accordingly.   NUTRITION DIAGNOSIS:   Inadequate oral intake related to wound healing, acute illness, chronic illness as evidenced by NPO status, estimated needs.  GOAL:   Patient will meet greater than or equal to 90% of their needs  MONITOR:    (Energy Intake, Anthropometrics, Digestive System, Electrolyte/Renal Profile, Glucose Profile)  REASON FOR ASSESSMENT:   Consult Enteral/tube feeding initiation and management  ASSESSMENT:    Pt remains on trach collar, extubated from the vent on 11/11. Pt scheduled for HD again today.  Diet Order:   NPO   Current Nutrition: EN: tolerating Vital 1.2  AF at 21mL/hr   Gastrointestinal Profile: per Nsg imodium order has helped stools, MD to change order to scheduled daily instead of prn Last BM: 10/03/2015, loose yellow stool   Scheduled Medications:  . antiseptic oral rinse  7 mL Mouth Rinse q12n4p  . chlorhexidine  15 mL Mouth Rinse BID  . epoetin (EPOGEN/PROCRIT) injection  10,000 Units Intravenous Q M,W,F-HD  . feeding supplement (PRO-STAT SUGAR FREE 64)  30 mL Per Tube 6 times per day  . folic acid  1 mg Oral Daily  . free water  20 mL Per Tube 6 times per day  . heparin subcutaneous  5,000 Units Subcutaneous Q12H  . hydrocortisone  10 mg Per Tube BID  . insulin aspart  0-9 Units Subcutaneous 6 times per day  . ipratropium-albuterol  3 mL Nebulization QID  . lidocaine  1 patch Transdermal Q24H  . [START ON 10/04/2015] loperamide  2 mg Oral Daily  . megestrol  400 mg Oral BID  . midodrine  5 mg Per Tube TID WC  . multivitamin  5 mL Oral Daily  .  pantoprazole sodium  40 mg Per Tube Daily  . sodium chloride  10-40 mL Intracatheter Q12H  . sodium hypochlorite   Irrigation BID  . sulfamethoxazole-trimethoprim  2.5 tablet Per Tube q1800  . thiamine IV  100 mg Intravenous Daily    Continuous Medications:  . feeding supplement (VITAL AF 1.2 CAL) 1,000 mL (10/03/15 1035)     Electrolyte/Renal Profile and Glucose Profile:   Recent Labs Lab 09/29/15 1210 09/30/15 1828 10/03/15 0412  NA 141 143 144  K 3.3* 4.2 4.3  CL 104 103 103  CO2 33* 33* 34*  BUN 48* 71* 82*  CREATININE 0.99 1.36* 1.39*  CALCIUM 8.0* 8.4* 8.3*  MG  --   --  1.7  PHOS  --  3.7 4.2  GLUCOSE 139* 130* 149*   Protein Profile:  Recent Labs Lab 09/30/15 1828  ALBUMIN 1.8*     Weight Trend since Admission: Filed Weights   10/01/15 0500 10/02/15 0400 10/03/15 0400  Weight: 220 lb 7.4 oz (100 kg) 218 lb 4.1 oz (99 kg) 220 lb 7.4 oz (100 kg)     Skin:   (stage IV sacrum, stage III hip, unstageable foot; rectal ulcer from fleixseal, ulcer in nose from dobhoff)   Height:   Ht Readings from Last 1 Encounters:  08/13/2015  (1.753 m)    Weight:   Wt Readings from Last 1 Encounters:  10/03/15  220 lb 7.4 oz (100 kg)     BMI:  Body mass index is 32.54 kg/(m^2).  Estimated Nutritional Needs:   Kcal:  2175-2535 kcals (BEE 1509, 1.2 AF, 1.2-1.4 IF) or 2490-2905 kcals (30-35 kcals/kg)   Protein:  125-166 g (1.5-2.0 g/kg) but likely closer to 166-208 g (2.0-2.5 g/kg)   Fluid:  1000 mL plus UOP  EDUCATION NEEDS:   No education needs identified at this time   HIGH Care Level  Leda QuailAllyson Kathye Cipriani, RD, LDN Pager [phone]

## 2015-10-04 LAB — BASIC METABOLIC PANEL
ANION GAP: 5 (ref 5–15)
BUN: 62 mg/dL — AB (ref 6–20)
CALCIUM: 8.2 mg/dL — AB (ref 8.9–10.3)
CO2: 33 mmol/L — ABNORMAL HIGH (ref 22–32)
Chloride: 102 mmol/L (ref 101–111)
Creatinine, Ser: 1.08 mg/dL — ABNORMAL HIGH (ref 0.44–1.00)
GFR calc Af Amer: >60 mL/min (ref >60)
GFR, EST NON AFRICAN AMERICAN: 59 mL/min — AB (ref >60)
GLUCOSE: 107 mg/dL — AB (ref 65–99)
Potassium: 3.9 mmol/L (ref 3.5–5.1)
Sodium: 140 mmol/L (ref 135–145)

## 2015-10-04 LAB — CBC
HCT: 29.6 % — ABNORMAL LOW (ref 35.0–47.0)
Hemoglobin: 9.1 g/dL — ABNORMAL LOW (ref 12.0–16.0)
MCH: 31.5 pg (ref 26.0–34.0)
MCHC: 30.7 g/dL — ABNORMAL LOW (ref 32.0–36.0)
MCV: 102.7 fL — AB (ref 80.0–100.0)
PLATELETS: 205 10*3/uL (ref 150–440)
RBC: 2.89 MIL/uL — ABNORMAL LOW (ref 3.80–5.20)
RDW: 19.8 % — AB (ref 11.5–14.5)
WBC: 12.6 10*3/uL — AB (ref 3.6–11.0)

## 2015-10-04 LAB — PHOSPHORUS: Phosphorus: 3.4 mg/dL (ref 2.5–4.6)

## 2015-10-04 LAB — GLUCOSE, CAPILLARY
GLUCOSE-CAPILLARY: 134 mg/dL — AB (ref 65–99)
Glucose-Capillary: 105 mg/dL — ABNORMAL HIGH (ref 65–99)
Glucose-Capillary: 132 mg/dL — ABNORMAL HIGH (ref 65–99)
Glucose-Capillary: 138 mg/dL — ABNORMAL HIGH (ref 65–99)
Glucose-Capillary: 103 mg/dL — ABNORMAL HIGH (ref 65–99)

## 2015-10-04 LAB — MAGNESIUM: Magnesium: 1.8 mg/dL (ref 1.7–2.4)

## 2015-10-04 MED ORDER — MAGNESIUM SULFATE 2 GM/50ML IV SOLN
2.0000 g | Freq: Once | INTRAVENOUS | Status: AC
  Administered 2015-10-04: 2 g via INTRAVENOUS
  Filled 2015-10-04: qty 50

## 2015-10-04 NOTE — Clinical Social Work Note (Signed)
Patient's medical team is continuing to work with patient and her husband regarding treatment plan. CSW available for support if needed. York SpanielMonica Jaisen Wiltrout MSW,LCSW [phone]

## 2015-10-04 NOTE — Progress Notes (Signed)
   10/04/15 1600  Clinical Encounter Type  Visited With Patient and family together  Visit Type Follow-up  Consult/Referral To Chaplain  Spiritual Encounters  Spiritual Needs Emotional  Stress Factors  Patient Stress Factors None identified  Family Stress Factors None identified  Chaplain rounded in unit and offered a compassionate presence and support to patient and her friend. Introduced self and provided [employer] presence. Chaplain Donnis Phaneuf A. Anthonee Gelin Ext. [phone]

## 2015-10-04 NOTE — Progress Notes (Signed)
[facility]  Name: Courtney [name] MRN: [id number] DOB: May 27, 1966    ADMISSION DATE:  07/28/2015     INITIAL PRESENTATION:  49 AAF who has been in medical facilities (hosp, LTAC, rehab) for 2 yrs following gastric bypass surgery with multiple complications. Now with chronic trach, ESRD, profound debilitation, severe sacral pressure ulcer. Was seemingly making progress and transferred to rehab facility approx one week prior to this admission. She was sent to [facility] ED with AMS and hypotension. Working dx of severe sepsis/septic shock due to infected sacral pressure ulcer. Since admission. Her course is been very complicated with numerous complications including septic shock and GI bleeding. Had to be placed on vent due to excessive secretions, now on TCT  INTERVAL HISTORY: Placed on vasopressors and placed back on vent 11/5, but was able to wean off levophed on 11/6 afternoon.  [name] changed out due to cuff leak on  11/5 PM, #6 Shiley cuffed  The patient was started on Megace and thiamine, on 09/22/2015 per the husband's request.  11/7>>husband at bedside updated on current plan. Plan for HD today, cont with TF, cont with daptomycin, awaiting bone cx and s/s from trach aspirate. Pressure in pilot balloon of trach in the 50s, if cont to be this high, might need to upsize trach.    11/8> no acute events overnight, mild thin secretions noted, was on the vent at 35% FiO2 during the night. Creatinine slightly worse compared to yesterday; had dialysis yesterday, 1 L removed. Sacral Bone culture shows Klebsiella pneumonia and Escherichia coli  11/9> placed on ventilator rate last night, today tolerating dialysis, goal removal is 1 L, we'll attempt trach collar trials today, patient more interactive, will open eyes and nod. Patient is currently on Septra and daptomycin.  11/10>tolerated about 8 hrs off TCT yesterday, placed back on vent overnight, somnolent  this morning but more alert as the day progresses.  HD with 1 liter removed yesterday, BP stable, not requiring pressors. RT report inc secretion, will obtain CXR and labs today.  11/14 currently on TCT, complains of back pain   MAJOR EVENTS/TEST RESULTS: Admission 02/07/14-05/07/14 Admission 07/21/14-09/06/14 Discharged to [facility]. Pt had palliative consult at that time, were asked to sign off by husband.  09/23 CT head: NAD 09/23 EEG: no epileptiform activity 09/23 PRBCs for Hgb 6.4 09/24 bedside debridement of sacral wound. Abscess drained 09/25 Off vasopressors. More alert. No distress. Worsening thrombocytopenia. Vanc DC'd 09/29  Dr. Sampson Goon (I.D) excused from the case by patient's husband. 10/02 MRI -multiple infarcts 10/03 tracheal bleeding- transfused platelets 10/03 hospitalist service excused from the case by patient's husband 10/03 Echocardiogram ejection fraction was 55-60%, pulmonary systolic pressure was 39 mmHg 78/29 restart TF's at lower rate, attempt reg HD  10/12 Transferred to med-surg floor. Remains on PCCM service 10/14 SLP eval: pt unable to tolerate PMV adequately 10/17-will re-attempt PM valve-discussed with Speech therapist 10/18 passed swallow eval-start pureed thick foods no thin liquids-continue NG feeds 10/19 transferred to step down for sepsis/aspiration pneumonia 10/19 cxr shows RLL opacity 10/21 sacral decub debride at bedside by surgery 10/22 started back on vasopressors while on HD 10/27 CT with osteo, R hip fx, unable to identify tip of dubhoff tube - sent for fluoro study 10/28 Ortho consultation: I do not feel that she is a surgical candidate. Therefore, I feel that it would best to manage this fracture nonsurgically and allow it to heal by itself over time, which it should.  10/28  Gen Surg consultation: Due to the lack of free air or free flow of contrast and the peritoneum there is no indication for any surgical intervention on this. Would recommend  pulling back the feeding tube 1-2 cm. Would recheck an abdominal film to confirm no pneumoperitoneum in the morning. Absence of any changes okay to continue using Dobbhoff for feeding and medications. 10/28 gastrograffin study: The study confirms that the feeding tube pes perforated through the duodenum and the tip is within a cavity that fills with injected contrast. 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 Pneumoniae, P. Mirabilis, VRE Wound 9/26 >> No growth Blood 9/23 >> NEG CDiff 9/27>>neg Stool Cx 10/15>> negative Cdiff 10/25>>neg Trach Aspirate 10/28>> light growth pseudomonas Blood 10/28 >> 1/2 GPC >>  Sputum cultures obtained 09/22/15 due to mucus plugging>>Pseudomonas BONE TISSUE Cx 11/3>>E.coli (ESBL), k. Pneumonia (daptomycin and septra)  ANTIMICROBIALS:  Aztreonam 9/23 >> 9/24 Vanc 9/23 >> 9/25 Vanc 9/26>>9/27 Daptomycin 9/27>> 10/12, 10/28>>  Meropenem 9/23 >> 10/14, 10/28 >> 10/31 Septra 11/8>>   VITAL SIGNS: Temp:  [98.4 F (36.9 C)-99 F (37.2 C)] 98.4 F (36.9 C) (11/15 0200) Pulse Rate:  [94-110] 99 (11/15 0800) Resp:  [9-28] 22 (11/15 0700) BP: (103-142)/(43-111) 118/68 mmHg (11/15 0800) SpO2:  [93 %-100 %] 97 % (11/15 0800) FiO2 (%):  [35 %] 35 % (11/15  0400) HEMODYNAMICS:   VENTILATOR SETTINGS: Vent Mode:  [-]  FiO2 (%):  [35 %] 35 % INTAKE / OUTPUT:  Intake/Output Summary (Last 24 hours) at 10/04/15 0909 Last data filed at 10/04/15 0700  Gross per 24 hour  Intake   1690 ml  Output   1000 ml  Net    690 ml    PHYSICAL EXAMINATION: Physical Examination:   VS: BP 118/68 mmHg  Pulse 99  Temp(Src) 98.4 F (36.9 C) (Axillary)  Resp 22  Ht  (1.753 m)  Wt 220 lb 7.4 oz (100 kg)  BMI 32.54 kg/m2  SpO2 97%  LMP  (LMP Unknown)  General Appearance: No resp distress, coarse upper airway sounds, off vent, on TCT Neuro: profoundly diffusely weak, alert today, nodding head HEENT: cushingoid facies, PERRLA, EOM intact Neck: trach site clean Pulmonary: clear anteriorly Cardiovascular: reg, + syst murmur Abdomen: soft, NT, +BS Extremities: warm, R>L UE edema Examination of the sacral decubitus ulcer- showed a very deep ulcer approximately 7-8 cm across.   stage IV ulcer.   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 BMET intermittently Monitor I/Os Correct electrolytes as indicated Intermittent HD per Renal Service (MWF) -plan to wean off vent as tolerated, goal TCT today is 24 hrs -check CXR today and labs (CBC, BMP, ABG). As needed  GASTROINTESTINAL A:  LGIB, resolved Hx of C. Diff Stool incontinence  P:  Cont PPI Resume TFs 10/30 goal now at  50 cc/hr -will not be able to take oral feeds as she is on the vent  HEMATOLOGIC A:  Acute on chronic anemia S/p Thrombocytopenia - HIT panel negative 9/25, resolved P: DVT px: SCDs + SQ heparin Monitor CBC intermittently Transfuse per usual ICU guidelines  INFECTIOUS A:  Recurrent Severe sepsis, due to sacral decub, currently stable.  Sacral decubitus ulcer with abscess - on Septra and daptomycin ID actively following along, appreciate recs - currently on daptomycin & septra  P:  Monitor temp, WBC count Micro and abx as above  ENDOCRINE A:  -DM 2, controlled. The patient developed some hypoglycemia when the tube feed was stopped and therefore she was started on D10 because she did not take in an adequate amount of calories to maintain her blood glucose levels in the normal range.  -Chronic steroid therapy, for a history of of adrenal insufficiency. P:  Monitor CBGs q 8 hrs - resume SSI if > 180 Cont hydrocortisone @ 10 mg BID Continue  tube feeds. We'll need to monitor blood glucose levels.  MSK A: Right hip fx - small intertrochanteric R fx - pain management. Ortho evaluated and felt not a surgical candidate at this time.   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  The Patient requires high complexity decision making for assessment and support, frequent evaluation and titration of therapies, application of advanced monitoring technologies and extensive interpretation of multiple databases. Critical Care Time devoted to patient care services described in this note is 35 minutes.   Overall, patient is critically ill,  prognosis is guarded.    Lucie LeatherKurian David Dewain Platz, M.D.  [facility]  Medical Director [facility] Medical Director [facility]

## 2015-10-05 LAB — GLUCOSE, CAPILLARY
GLUCOSE-CAPILLARY: 119 mg/dL — AB (ref 65–99)
GLUCOSE-CAPILLARY: 121 mg/dL — AB (ref 65–99)
GLUCOSE-CAPILLARY: 128 mg/dL — AB (ref 65–99)
GLUCOSE-CAPILLARY: 133 mg/dL — AB (ref 65–99)
GLUCOSE-CAPILLARY: 137 mg/dL — AB (ref 65–99)
GLUCOSE-CAPILLARY: 87 mg/dL (ref 65–99)
Glucose-Capillary: 120 mg/dL — ABNORMAL HIGH (ref 65–99)

## 2015-10-05 MED ORDER — ALBUMIN HUMAN 25 % IV SOLN
25.0000 g | Freq: Once | INTRAVENOUS | Status: AC
  Administered 2015-10-05: 25 g via INTRAVENOUS
  Filled 2015-10-05: qty 100

## 2015-10-05 MED FILL — Menthol-Zinc Oxide Oint 0.44-20.625%: CUTANEOUS | Qty: 71 | Status: AC

## 2015-10-05 NOTE — Progress Notes (Signed)
[facility] Progess Note  Name: Courtney GrinderHedda McMillian Monroe MRN: [id number] DOB: Oct 23, 1966    ADMISSION DATE:  08/18/2015     INITIAL PRESENTATION:  49 AAF who has been in medical facilities (hosp, LTAC, rehab) for 2 yrs following gastric bypass surgery with multiple complications. Now with chronic trach, ESRD, profound debilitation, severe sacral pressure ulcer. Was seemingly making progress and transferred to rehab facility approx one week prior to this admission. She [facility] with AMS and hypotension. Working dx of severe sepsis/septic shock due to infected sacral pressure ulcer. Since admission. Her course is been very complicated with numerous complications including septic shock and GI bleeding. Had to be placed on vent due to excessive secretions, now on TCT-still with some secretions Patient refusing to take oral feeds, PT,ST and Neuro and Wound Care to follow up  INTERVAL HISTORY: Placed on vasopressors and placed back on vent 11/5, but was able to wean off levophed on 11/6 afternoon.   [name] changed out due to cuff leak on  11/5 PM, #6 Shiley cuffed, patient refusing oral intake  The patient was started on Megace and thiamine, on 09/22/2015 per the husband's request.  11/7>>husband at bedside updated on current plan. Plan for HD today, cont with TF, cont with daptomycin, awaiting bone cx and s/s from trach aspirate. Pressure in pilot balloon of trach in the 50s, if cont to be this high, might need to upsize trach.    11/8> no acute events overnight, mild thin secretions noted, was on the vent at 35% FiO2 during the night. Creatinine slightly worse compared to yesterday; had dialysis yesterday, 1 L removed. Sacral Bone culture shows Klebsiella pneumonia and Escherichia coli  11/9> placed on ventilator rate last night, today tolerating dialysis, goal removal is 1 L, we'll attempt trach collar trials today, patient more interactive, will open eyes and  nod. Patient is currently on Septra and daptomycin.  11/10>tolerated about 8 hrs off TCT yesterday, placed back on vent overnight, somnolent this morning but more alert as the day progresses.  HD with 1 liter removed yesterday, BP stable, not requiring pressors. RT report inc secretion, will obtain CXR and labs today.  11/14 currently on TCT, complains of back pain   MAJOR EVENTS/TEST RESULTS: Admission 02/07/14-05/07/14 Admission 07/21/14-09/06/14 Discharged to [facility]. Pt had palliative consult at that time, were asked to sign off by husband.  09/23 CT head: NAD 09/23 EEG: no epileptiform activity 09/23 PRBCs for Hgb 6.4 09/24 bedside debridement of sacral wound. Abscess drained 09/25 Off vasopressors. More alert. No distress. Worsening thrombocytopenia. Vanc DC'd 09/29  Dr. [name] (I.D) excused from the case by patient's husband. 10/02 MRI -multiple infarcts 10/03 tracheal bleeding- transfused platelets 10/03 hospitalist service excused from the case by patient's husband 10/03 Echocardiogram ejection fraction was 55-60%, pulmonary systolic pressure was 39 mmHg 62/1310/08 restart TF's at lower rate, attempt reg HD  10/12 Transferred to med-surg floor. Remains on PCCM service 10/14 SLP eval: pt unable to tolerate PMV adequately 10/17-will re-attempt PM valve-discussed with Speech therapist 10/18 passed swallow eval-start pureed thick foods no thin liquids-continue NG feeds 10/19 transferred to step down for sepsis/aspiration pneumonia 10/19 cxr shows RLL opacity 10/21 sacral decub debride at bedside by surgery 10/22 started back on vasopressors while on HD 10/27 CT with osteo, R hip fx, unable to identify tip of dubhoff tube - sent for fluoro study 10/28 Ortho consultation: I do not feel that she is a surgical candidate. Therefore, I feel  that it would best to manage this fracture nonsurgically and allow it to heal by itself over time, which it should.  10/28 Gen Surg consultation: Due to  the lack of free air or free flow of contrast and the peritoneum there is no indication for any surgical intervention on this. Would recommend pulling back the feeding tube 1-2 cm. Would recheck an abdominal film to confirm no pneumoperitoneum in the morning. Absence of any changes okay to continue using Dobbhoff for feeding and medications. 10/28 gastrograffin study: The study confirms that the feeding tube pes perforated through the duodenum and the tip is within a cavity that fills with injected contrast. 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 Pneumoniae, P. Mirabilis, VRE Wound 9/26 >> No growth Blood 9/23 >> NEG CDiff 9/27>>neg Stool Cx 10/15>> negative Cdiff 10/25>>neg Trach Aspirate 10/28>> light growth pseudomonas Blood 10/28 >> 1/2 GPC >>  Sputum cultures obtained 09/22/15 due to mucus plugging>>Pseudomonas BONE TISSUE Cx 11/3>>E.coli (ESBL), k. Pneumonia (daptomycin and septra)  ANTIMICROBIALS:  Aztreonam 9/23 >> 9/24 Vanc 9/23 >> 9/25 Vanc 9/26>>9/27 Daptomycin 9/27>> 10/12, 10/28>>  Meropenem 9/23 >> 10/14, 10/28 >> 10/31 Septra 11/8>>   VITAL SIGNS: Temp:  [97.7 F (36.5 C)-99.2 F (37.3 C)] 98.1 F (36.7 C) (11/16 0800) Pulse Rate:  [94-109] 96  (11/16 0600) Resp:  [0-30] 0 (11/16 0600) BP: (82-132)/(52-74) 114/60 mmHg (11/16 0600) SpO2:  [87 %-100 %] 96 % (11/16 0900) FiO2 (%):  [35 %] 35 % (11/16 0900) Weight:  [213 lb 13.5 oz (97 kg)-220 lb 7.4 oz (100 kg)] 220 lb 7.4 oz (100 kg) (11/16 0400) HEMODYNAMICS:   VENTILATOR SETTINGS: Vent Mode:  [-]  FiO2 (%):  [35 %] 35 % INTAKE / OUTPUT:  Intake/Output Summary (Last 24 hours) at 10/05/15 1140 Last data filed at 10/05/15 0600  Gross per 24 hour  Intake   1410 ml  Output      0 ml  Net   1410 ml    PHYSICAL EXAMINATION: Physical Examination:   VS: BP 114/60 mmHg  Pulse 96  Temp(Src) 98.1 F (36.7 C) (Axillary)  Resp 0  Ht  (1.753 m)  Wt 220 lb 7.4 oz (100 kg)  BMI 32.54 kg/m2  SpO2 96%  LMP  (LMP Unknown)  General Appearance: No resp distress, coarse upper airway sounds, off vent, on TCT Neuro: profoundly diffusely weak, alert today, nodding head HEENT: cushingoid facies, PERRLA, EOM intact Neck: trach site clean Pulmonary: clear anteriorly Cardiovascular: reg, + syst murmur Abdomen: soft, NT, +BS Extremities: warm, R>L UE edema Examination of the sacral decubitus ulcer- showed a very deep ulcer approximately 7-8 cm across.   stage IV ulcer.   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 BMET intermittently Monitor I/Os Correct electrolytes as indicated Intermittent HD per Renal  Service (MWF-dialysis today -check CXR today and labs (CBC, BMP, ABG). As needed  GASTROINTESTINAL A:  LGIB, resolved Hx of C. Diff Stool incontinence  P:  Cont PPI Resume TFs 10/30 goal now at 50 cc/hr  HEMATOLOGIC A:  Acute on chronic anemia S/p Thrombocytopenia - HIT panel negative 9/25, resolved P: DVT px: SCDs + SQ heparin Monitor CBC intermittently Transfuse per usual ICU guidelines  INFECTIOUS A:  Recurrent Severe sepsis, due to sacral decub, currently stable.  Sacral decubitus ulcer with abscess - on Septra and daptomycin ID actively following along, appreciate recs - currently on daptomycin & septra P:  Monitor temp, WBC count Micro and abx as above -follow ID recs  ENDOCRINE A:  -DM 2, controlled. The patient developed some hypoglycemia when the tube feed was stopped and therefore she was started on D10 because she did not take in an adequate amount of calories to maintain her blood glucose levels in the normal range.  -Chronic steroid therapy, for a history of of adrenal insufficiency. P:  Monitor CBGs q 8 hrs - resume SSI if > 180 Cont hydrocortisone @ 10 mg BID Continue  tube feeds. We'll need to monitor blood glucose levels.  MSK A: Right hip fx - small intertrochanteric R fx - pain management. Ortho evaluated and felt not a surgical candidate at this time.   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  The Patient requires high complexity decision making for assessment and support, frequent evaluation and titration of therapies,  application of advanced monitoring technologies and extensive interpretation of multiple databases. Critical Care Time devoted to patient care services described in this note is 35 minutes.   Overall, patient is critically ill, prognosis is guarded.    Lucie Leather, M.D.  [facility]  Medical Director [facility] Medical Director [facility] Cardio-Pulmonary Department

## 2015-10-05 NOTE — Care Management (Signed)
Have paged neuro to discuss assessment

## 2015-10-05 NOTE — Progress Notes (Signed)
NEUROLOGY NOTE  S: No changes today per nursing, family not at bedside but there is much improvement since last time  ROS unobtainable to aphasia  O: 98.1   114/60   96   18 No distress, overweight Normocephalic, oropharynx clear Supple, no JVD CTA B, no wheezing tachycardic, no murmur Diffuse edema, no C/C  Alert and follows most facial commands, trached and mute Pupils 4mm and sluggish, weak R corneal, good L corneal, R droop Localizes on L, trace movement on R  A/P: 1. 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 This will still cause permanent deficits of R hemiparesis and aphasia which have improved some since last time seen by me 2. Encephalopathy-appears resolved -  Start ASA 81mg  daily - no need for  amantadine 100mg  BID PO or thiamine now - did not see Hem/Onc consult and would consider - Try to keep Hgb > 8 - Keep MAP > 60 for good perfusion - Will update husband over the phone - Will sign off, please call with questions -  Needs outpatient f/u in 3 months

## 2015-10-05 NOTE — Progress Notes (Signed)
OT Cancellation Note  Patient Details Name: Courtney GrinderHedda McMillian Monroe MRN: [id number] DOB: 1966/10/02   Cancelled Treatment:    Reason Eval/Treat Not Completed: Patient at procedure or test/ unavailable. On dialysis.  [name], Cris Talavera M 10/05/2015, 5:04 PM

## 2015-10-05 NOTE — Progress Notes (Signed)
Nutrition Follow-up  DOCUMENTATION CODES:   Severe malnutrition in context of acute illness/injury  INTERVENTION:   EN: continue TF at current rate of 50 ml/hr as per MD order; discussed increasing TF rate with MD Belia HemanKasa, MD wanting to continue TF at current rate of 50 ml/hr Coordination of Care: discussed possibility of restarting diet with MD Kasa as pt now off vent for several days, on trach collar. MD agrees with having SLP come to reevaluate patient. Although, as previously documented with calorie count, pt will require nutrition support to meet nutritional needs even if diet able to be advanced   NUTRITION DIAGNOSIS:   Inadequate oral intake related to wound healing, acute illness, chronic illness as evidenced by NPO status, estimated needs.  GOAL:   Patient will meet greater than or equal to 90% of their needs  MONITOR:    (Energy Intake, Anthropometrics, Digestive System, Electrolyte/Renal Profile, Glucose Profile)  REASON FOR ASSESSMENT:   Consult Enteral/tube feeding initiation and management  ASSESSMENT:    Pt on trach collar, HD as scheduled  EN: tolerating Vital 1.2 AF at rate of 50 ml/hr, Prostat 6 times daily   Digestive System: pt with dobhoff in place since 9/26, abdomen soft/obese, BS hypoactive, pt with 1-3 loose BMs per day, imodium daily  Skin:   (stage IV sacrum, stage III hip, unstageable foot; rectal ulcer from fleixseal, ulcer in nose from dobhoff)   Electrolyte and Renal Profile:  Recent Labs Lab 09/30/15 1828 10/03/15 0412 10/04/15 0900  BUN 71* 82* 62*  CREATININE 1.36* 1.39* 1.08*  NA 143 144 140  K 4.2 4.3 3.9  MG  --  1.7 1.8  PHOS 3.7 4.2 3.4   Glucose Profile:  Recent Labs  10/05/15 0352 10/05/15 0750 10/05/15 1146  GLUCAP 119* 137* 133*   Meds: reviewed  Height:   Ht Readings from Last 1 Encounters:  07/21/2015 5\' 9"  (1.753 m)    Weight:   Wt Readings from Last 1 Encounters:  10/05/15 220 lb 7.4 oz (100 kg)     Filed Weights   10/03/15 0400 10/04/15 1230 10/05/15 0400  Weight: 220 lb 7.4 oz (100 kg) 213 lb 13.5 oz (97 kg) 220 lb 7.4 oz (100 kg)   BMI:  Body mass index is 32.54 kg/(m^2).  Estimated Nutritional Needs:   Kcal:  2175-2535 kcals (BEE 1509, 1.2 AF, 1.2-1.4 IF) or 2490-2905 kcals (30-35 kcals/kg)   Protein:  125-166 g (1.5-2.0 g/kg) but likely closer to 166-208 g (2.0-2.5 g/kg)   Fluid:  1000 mL plus UOP  EDUCATION NEEDS:   No education needs identified at this time  HIGH Care Level  Romelle Starcherate Siedah Sedor MS, RD, LDN [phone] Pager

## 2015-10-05 NOTE — Progress Notes (Signed)
PT Cancellation Note  Patient Details Name: Lisette GrinderHedda McMillian Monroe MRN: [id number] DOB: [birth year]   Cancelled Treatment:    Reason Eval/Treat Not Completed: Patient at procedure or test/unavailable (Treatment session attempted.  Patient currently receiving dialysis and unavailable for treatment session.  Will re-attempt next date as patient available and medically appropriate.)   Kairy Folsom H. Manson PasseyBrown, PT, DPT, NCS 10/05/2015, 3:30 PM [phone]

## 2015-10-05 NOTE — Progress Notes (Signed)
Subjective:  Pt due for HD today. We continue to use albumin, midodrine for blood pressure support.   Objective:  Vital signs in last 24 hours:  Temp:  [97.7 F (36.5 C)-99.2 F (37.3 C)] 98.1 F (36.7 C) (11/16 0800) Pulse Rate:  [94-109] 96 (11/16 0600) Resp:  [0-30] 0 (11/16 0600) BP: (82-132)/(51-74) 114/60 mmHg (11/16 0600) SpO2:  [87 %-100 %] 96 % (11/16 0900) FiO2 (%):  [35 %] 35 % (11/16 0900) Weight:  [97 kg (213 lb 13.5 oz)-100 kg (220 lb 7.4 oz)] 100 kg (220 lb 7.4 oz) (11/16 0400)  Weight change:  Filed Weights   10/03/15 0400 10/04/15 1230 10/05/15 0400  Weight: 100 kg (220 lb 7.4 oz) 97 kg (213 lb 13.5 oz) 100 kg (220 lb 7.4 oz)    Intake/Output: I/O last 3 completed shifts: In: 2540 [NG/GT:2540] Out: 0    Intake/Output this shift:     Physical Exam: General: No acute distress, chronically ill appearing  Head: NG in place, eyes open  Eyes: anicteric  Neck: Tracheostomy in place  Lungs:  Scattered rhonchi, normal effort  Heart: S1S2 +tachycardia  Abdomen:  Soft, nontender, BS present  Extremities: + dependent edema, anasarca  Neurologic: Awake, will follow simple commands  Access:  R IJ permcath.       Basic Metabolic Panel:  Recent Labs Lab 09/29/15 1210 09/30/15 1828 10/03/15 0412 10/04/15 0900  NA 141 143 144 140  K 3.3* 4.2 4.3 3.9  CL 104 103 103 102  CO2 33* 33* 34* 33*  GLUCOSE 139* 130* 149* 107*  BUN 48* 71* 82* 62*  CREATININE 0.99 1.36* 1.39* 1.08*  CALCIUM 8.0* 8.4* 8.3* 8.2*  MG  --   --  1.7 1.8  PHOS  --  3.7 4.2 3.4    Liver Function Tests:  Recent Labs Lab 09/30/15 1828  ALBUMIN 1.8*   No results for input(s): LIPASE, AMYLASE in the last 168 hours. No results for input(s): AMMONIA in the last 168 hours.  CBC:  Recent Labs Lab 09/29/15 1210 09/30/15 0522 09/30/15 1828 10/03/15 0412 10/04/15 0900  WBC 9.1 8.6 10.0 12.2* 12.6*  HGB 6.9* 8.3* 9.1* 8.5* 9.1*  HCT 22.5* 26.3* 28.5* 27.4* 29.6*  MCV 104.6*  100.6* 100.3* 103.4* 102.7*  PLT 128* 143* 171 183 205    Cardiac Enzymes: No results for input(s): CKTOTAL, CKMB, CKMBINDEX, TROPONINI in the last 168 hours.  BNP: Invalid input(s): POCBNP  CBG:  Recent Labs Lab 10/04/15 1602 10/04/15 2005 10/05/15 0011 10/05/15 0352 10/05/15 0750  GLUCAP 138* 103* 121* 119* 137*    Microbiology: Results for orders placed or performed during the hospital encounter of 08-16-2015  Blood Culture (routine x 2)     Status: None   Collection Time: 2015/08/16  8:51 AM  Result Value Ref Range Status   Specimen Description BLOOD [name]  Final   Special Requests BOTTLES DRAWN AEROBIC AND ANAEROBIC  3CC  Final   Culture NO GROWTH 5 DAYS  Final   Report Status 08/17/2015 FINAL  Final  Blood Culture (routine x 2)     Status: None   Collection Time: 2015-08-16  9:20 AM  Result Value Ref Range Status   Specimen Description BLOOD LEFT ARM  Final   Special Requests BOTTLES DRAWN AEROBIC AND ANAEROBIC  1CC  Final   Culture NO GROWTH 5 DAYS  Final   Report Status 08/17/2015 FINAL  Final  Wound culture     Status: None   Collection  Time: 08/10/2015  9:20 AM  Result Value Ref Range Status   Specimen Description DECUBITIS  Final   Special Requests Normal  Final   Gram Stain   Final    FEW WBC SEEN MANY GRAM NEGATIVE RODS RARE GRAM POSITIVE COCCI    Culture   Final    HEAVY GROWTH ESCHERICHIA COLI MODERATE GROWTH ENTEROBACTER AEROGENES PROTEUS MIRABILIS HEAVY GROWTH ENTEROCOCCUS SPECIES VRE HAVE INTRINSIC RESISTANCE TO MOST COMMONLY USED ANTIBIOTICS AND THE ABILITY TO ACQUIRE RESISTANCE TO MOST AVAILABLE ANTIBIOTICS.    Report Status 08/16/2015 FINAL  Final   Organism ID, Bacteria ESCHERICHIA COLI  Final   Organism ID, Bacteria ENTEROBACTER AEROGENES  Final   Organism ID, Bacteria PROTEUS MIRABILIS  Final   Organism ID, Bacteria ENTEROCOCCUS SPECIES  Final      Susceptibility   Enterobacter aerogenes - MIC*    CEFTAZIDIME <=1 SENSITIVE Sensitive      CEFAZOLIN >=64 RESISTANT Resistant     CEFTRIAXONE <=1 SENSITIVE Sensitive     CIPROFLOXACIN <=0.25 SENSITIVE Sensitive     GENTAMICIN <=1 SENSITIVE Sensitive     IMIPENEM 1 SENSITIVE Sensitive     TRIMETH/SULFA <=20 SENSITIVE Sensitive     * MODERATE GROWTH ENTEROBACTER AEROGENES   Escherichia coli - MIC*    AMPICILLIN <=2 SENSITIVE Sensitive     CEFTAZIDIME <=1 SENSITIVE Sensitive     CEFAZOLIN <=4 SENSITIVE Sensitive     CEFTRIAXONE <=1 SENSITIVE Sensitive     CIPROFLOXACIN <=0.25 SENSITIVE Sensitive     GENTAMICIN <=1 SENSITIVE Sensitive     IMIPENEM <=0.25 SENSITIVE Sensitive     TRIMETH/SULFA <=20 SENSITIVE Sensitive     * HEAVY GROWTH ESCHERICHIA COLI   Proteus mirabilis - MIC*    AMPICILLIN >=32 RESISTANT Resistant     CEFTAZIDIME <=1 SENSITIVE Sensitive     CEFAZOLIN 8 SENSITIVE Sensitive     CEFTRIAXONE <=1 SENSITIVE Sensitive     CIPROFLOXACIN <=0.25 SENSITIVE Sensitive     GENTAMICIN <=1 SENSITIVE Sensitive     IMIPENEM 1 SENSITIVE Sensitive     TRIMETH/SULFA <=20 SENSITIVE Sensitive     * PROTEUS MIRABILIS   Enterococcus species - MIC*    AMPICILLIN >=32 RESISTANT Resistant     VANCOMYCIN >=32 RESISTANT Resistant     GENTAMICIN SYNERGY SENSITIVE Sensitive     TETRACYCLINE Value in next row Resistant      RESISTANT>=16    * HEAVY GROWTH ENTEROCOCCUS SPECIES  MRSA PCR Screening     Status: None   Collection Time: 07/30/2015  2:38 PM  Result Value Ref Range Status   MRSA by PCR NEGATIVE NEGATIVE Final    Comment:        The GeneXpert MRSA Assay (FDA approved for NASAL specimens only), is one component of a comprehensive MRSA colonization surveillance program. It is not intended to diagnose MRSA infection nor to guide or monitor treatment for MRSA infections.   Blood culture (single)     Status: None   Collection Time: 08/07/2015  3:36 PM  Result Value Ref Range Status   Specimen Description BLOOD RIGHT ASSIST CONTROL  Final   Special Requests BOTTLES DRAWN  AEROBIC AND ANAEROBIC  Final   Culture NO GROWTH 5 DAYS  Final   Report Status 08/17/2015 FINAL  Final  Wound culture     Status: None   Collection Time: 08/13/15 12:37 PM  Result Value Ref Range Status   Specimen Description WOUND  Final   Special Requests Normal  Final  Gram Stain   Final    FEW WBC SEEN TOO NUMEROUS TO COUNT GRAM NEGATIVE RODS FEW GRAM POSITIVE COCCI    Culture   Final    HEAVY GROWTH ESCHERICHIA COLI MODERATE GROWTH PROTEUS MIRABILIS LIGHT GROWTH KLEBSIELLA PNEUMONIAE MODERATE GROWTH ENTEROCOCCUS GALLINARUM CRITICAL RESULT CALLED TO, READ BACK BY AND VERIFIED WITH: [facility] BORBA AT 1042 08/16/15 DV    Report Status 08/17/2015 FINAL  Final   Organism ID, Bacteria ESCHERICHIA COLI  Final   Organism ID, Bacteria PROTEUS MIRABILIS  Final   Organism ID, Bacteria KLEBSIELLA PNEUMONIAE  Final   Organism ID, Bacteria ENTEROCOCCUS GALLINARUM  Final      Susceptibility   Escherichia coli - MIC*    AMPICILLIN >=32 RESISTANT Resistant     CEFTAZIDIME 4 RESISTANT Resistant     CEFAZOLIN >=64 RESISTANT Resistant     CEFTRIAXONE 16 RESISTANT Resistant     GENTAMICIN 2 SENSITIVE Sensitive     IMIPENEM >=16 RESISTANT Resistant     TRIMETH/SULFA <=20 SENSITIVE Sensitive     Extended ESBL POSITIVE Resistant     PIP/TAZO Value in next row Resistant      RESISTANT>=128    CIPROFLOXACIN Value in next row Sensitive      SENSITIVE<=0.25    * HEAVY GROWTH ESCHERICHIA COLI   Klebsiella pneumoniae - MIC*    AMPICILLIN Value in next row Resistant      SENSITIVE<=0.25    CEFTAZIDIME Value in next row Resistant      SENSITIVE<=0.25    CEFAZOLIN Value in next row Resistant      SENSITIVE<=0.25    CEFTRIAXONE Value in next row Resistant      SENSITIVE<=0.25    CIPROFLOXACIN Value in next row Resistant      SENSITIVE<=0.25    GENTAMICIN Value in next row Sensitive      SENSITIVE<=0.25    IMIPENEM Value in next row Resistant      SENSITIVE<=0.25    TRIMETH/SULFA Value in  next row Resistant      SENSITIVE<=0.25    PIP/TAZO Value in next row Resistant      RESISTANT>=128    * LIGHT GROWTH KLEBSIELLA PNEUMONIAE   Proteus mirabilis - MIC*    AMPICILLIN Value in next row Resistant      RESISTANT>=128    CEFTAZIDIME Value in next row Sensitive      RESISTANT>=128    CEFAZOLIN Value in next row Sensitive      RESISTANT>=128    CEFTRIAXONE Value in next row Sensitive      RESISTANT>=128    CIPROFLOXACIN Value in next row Sensitive      RESISTANT>=128    GENTAMICIN Value in next row Sensitive      RESISTANT>=128    IMIPENEM Value in next row Sensitive      RESISTANT>=128    TRIMETH/SULFA Value in next row Sensitive      RESISTANT>=128    PIP/TAZO Value in next row Sensitive      SENSITIVE<=4    * MODERATE GROWTH PROTEUS MIRABILIS   Enterococcus gallinarum - MIC*    AMPICILLIN Value in next row Resistant      SENSITIVE<=4    GENTAMICIN SYNERGY Value in next row Sensitive      SENSITIVE<=4    CIPROFLOXACIN Value in next row Resistant      RESISTANT>=8    TETRACYCLINE Value in next row Resistant      RESISTANT>=16    * MODERATE GROWTH ENTEROCOCCUS GALLINARUM  Wound culture  Status: None   Collection Time: 08/15/15  2:53 PM  Result Value Ref Range Status   Specimen Description WOUND  Final   Special Requests NONE  Final   Gram Stain FEW WBC SEEN NO ORGANISMS SEEN   Final   Culture NO GROWTH 3 DAYS  Final   Report Status 08/18/2015 FINAL  Final  C difficile quick scan w PCR reflex     Status: None   Collection Time: 08/17/15 11:34 AM  Result Value Ref Range Status   C Diff antigen NEGATIVE NEGATIVE Final   C Diff toxin NEGATIVE NEGATIVE Final   C Diff interpretation Negative for C. difficile  Final  Stool culture     Status: None   Collection Time: 09/03/15  3:51 PM  Result Value Ref Range Status   Specimen Description STOOL  Final   Special Requests Immunocompromised  Final   Culture   Final    NO SALMONELLA OR SHIGELLA ISOLATED No  Pathogenic E. coli detected NO CAMPYLOBACTER DETECTED    Report Status 09/07/2015 FINAL  Final  C difficile quick scan w PCR reflex     Status: None   Collection Time: 09/13/15 12:51 AM  Result Value Ref Range Status   C Diff antigen NEGATIVE NEGATIVE Final   C Diff toxin NEGATIVE NEGATIVE Final   C Diff interpretation Negative for C. difficile  Final  Culture, blood (routine x 2)     Status: None   Collection Time: 09/16/15 11:24 AM  Result Value Ref Range Status   Specimen Description BLOOD LEFT HAND  Final   Special Requests BOTTLES DRAWN AEROBIC AND ANAEROBIC  1CC  Final   Culture NO GROWTH 5 DAYS  Final   Report Status 09/21/2015 FINAL  Final  Culture, blood (routine x 2)     Status: None   Collection Time: 09/16/15 12:23 PM  Result Value Ref Range Status   Specimen Description BLOOD RIGHT HAND  Final   Special Requests BOTTLES DRAWN AEROBIC AND ANAEROBIC  1CC  Final   Culture  Setup Time   Final    GRAM POSITIVE COCCI AEROBIC BOTTLE ONLY CRITICAL RESULT CALLED TO, READ BACK BY AND VERIFIED WITH: TESS THOMAS,RN 09/17/2015 0631 BY JRS.    Culture   Final    ENTEROCOCCUS FAECALIS AEROBIC BOTTLE ONLY VRE HAVE INTRINSIC RESISTANCE TO MOST COMMONLY USED ANTIBIOTICS AND THE ABILITY TO ACQUIRE RESISTANCE TO MOST AVAILABLE ANTIBIOTICS. CRITICAL RESULT CALLED TO, READ BACK BY AND VERIFIED WITH: CHERYL SMITH AT 4098 09/19/15 DV    Report Status 09/21/2015 FINAL  Final   Organism ID, Bacteria ENTEROCOCCUS FAECALIS  Final      Susceptibility   Enterococcus faecalis - MIC*    AMPICILLIN <=2 SENSITIVE Sensitive     LINEZOLID 2 SENSITIVE Sensitive     CIPROFLOXACIN Value in next row Resistant      RESISTANT>=8    TETRACYCLINE Value in next row Resistant      RESISTANT>=16    VANCOMYCIN Value in next row Resistant      RESISTANT>=32    GENTAMICIN SYNERGY Value in next row Resistant      RESISTANT>=32    * ENTEROCOCCUS FAECALIS  Culture, respiratory (NON-Expectorated)     Status:  None   Collection Time: 09/16/15  3:50 PM  Result Value Ref Range Status   Specimen Description TRACHEAL ASPIRATE  Final   Special Requests Immunocompromised  Final   Gram Stain   Final    FEW WBC SEEN GOOD SPECIMEN -  80-90% WBCS RARE GRAM NEGATIVE RODS    Culture LIGHT GROWTH PSEUDOMONAS AERUGINOSA  Final   Report Status 09/19/2015 FINAL  Final   Organism ID, Bacteria PSEUDOMONAS AERUGINOSA  Final      Susceptibility   Pseudomonas aeruginosa - MIC*    CEFTAZIDIME 8 SENSITIVE Sensitive     CIPROFLOXACIN 2 INTERMEDIATE Intermediate     GENTAMICIN >=16 RESISTANT Resistant     IMIPENEM >=16 RESISTANT Resistant     PIP/TAZO Value in next row Sensitive      SENSITIVE32    CEFEPIME Value in next row Sensitive      SENSITIVE8    LEVOFLOXACIN Value in next row Resistant      RESISTANT>=8    * LIGHT GROWTH PSEUDOMONAS AERUGINOSA  Culture, expectorated sputum-assessment     Status: None   Collection Time: 09/22/15  2:09 PM  Result Value Ref Range Status   Specimen Description ENDOTRACHEAL  Final   Special Requests Normal  Final   Sputum evaluation THIS SPECIMEN IS ACCEPTABLE FOR SPUTUM CULTURE  Final   Report Status 09/24/2015 FINAL  Final  Culture, respiratory (NON-Expectorated)     Status: None   Collection Time: 09/22/15  2:09 PM  Result Value Ref Range Status   Specimen Description ENDOTRACHEAL  Final   Special Requests Normal Reflexed from [id number]  Final   Gram Stain   Final    FEW WBC SEEN FEW GRAM NEGATIVE RODS POOR SPECIMEN - LESS THAN 70% WBCS    Culture   Final    MODERATE GROWTH PSEUDOMONAS AERUGINOSA LIGHT GROWTH KLEBSIELLA PNEUMONIAE REFER TO SENSITIVITIES FROM PREVIOUS CULTURE FOR ORG 2    Report Status 10/01/2015 FINAL  Final   Organism ID, Bacteria PSEUDOMONAS AERUGINOSA  Final   Organism ID, Bacteria KLEBSIELLA PNEUMONIAE  Final      Susceptibility   Pseudomonas aeruginosa - MIC*    CEFTAZIDIME 4 SENSITIVE Sensitive     CIPROFLOXACIN 2 INTERMEDIATE  Intermediate     GENTAMICIN >=16 RESISTANT Resistant     IMIPENEM >=16 RESISTANT Resistant     PIP/TAZO Value in next row Sensitive      SENSITIVE16    * MODERATE GROWTH PSEUDOMONAS AERUGINOSA  Tissue culture     Status: None   Collection Time: 09/24/15  6:44 AM  Result Value Ref Range Status   Specimen Description BONE  Final   Special Requests Normal  Final   Gram Stain MODERATE WBC SEEN FEW GRAM NEGATIVE RODS   Final   Culture   Final    MODERATE GROWTH ESCHERICHIA COLI MODERATE GROWTH KLEBSIELLA PNEUMONIAE ESBL-EXTENDED SPECTRUM BETA LACTAMASE-THE ORGANISM IS RESISTANT TO PENICILLINS, CEPHALOSPORINS AND AZTREONAM ACCORDING TO CLSI M100-S15 VOL.25 N01 JAN 2005. ORGANISM 1 This organism isolate is resistant to one or more antiotic agents in three or more antimicrobial categories.  Suggest Infectious Disease consult.   ORGANISM 2 CRITICAL RESULT CALLED TO, READ BACK BY AND VERIFIED WITH: RN Mardene Celeste LINDSAY 09/27/15 1005AM    Report Status 09/28/2015 FINAL  Final   Organism ID, Bacteria ESCHERICHIA COLI  Final   Organism ID, Bacteria KLEBSIELLA PNEUMONIAE  Final      Susceptibility   Escherichia coli - MIC*    AMPICILLIN >=32 RESISTANT Resistant     CEFTAZIDIME 4 RESISTANT Resistant     CEFAZOLIN >=64 RESISTANT Resistant     CEFTRIAXONE 8 RESISTANT Resistant     GENTAMICIN <=1 SENSITIVE Sensitive     IMIPENEM 8 RESISTANT Resistant     TRIMETH/SULFA <=20 SENSITIVE  Sensitive     Extended ESBL POSITIVE Resistant     PIP/TAZO Value in next row Resistant      RESISTANT>=128    * MODERATE GROWTH ESCHERICHIA COLI   Klebsiella pneumoniae - MIC*    AMPICILLIN Value in next row Resistant      RESISTANT>=128    CEFTAZIDIME Value in next row Resistant      RESISTANT>=128    CEFAZOLIN Value in next row Resistant      RESISTANT>=128    CEFTRIAXONE Value in next row Resistant      RESISTANT>=128    CIPROFLOXACIN Value in next row Resistant      RESISTANT>=128    GENTAMICIN Value in  next row Resistant      RESISTANT>=128    IMIPENEM Value in next row Resistant      RESISTANT>=128    TRIMETH/SULFA Value in next row Sensitive      RESISTANT>=128    PIP/TAZO Value in next row Resistant      RESISTANT>=128    * MODERATE GROWTH KLEBSIELLA PNEUMONIAE  Anaerobic culture     Status: None   Collection Time: 09/24/15  3:55 PM  Result Value Ref Range Status   Specimen Description BONE  Final   Special Requests Normal  Final   Culture NO ANAEROBES ISOLATED  Final   Report Status 09/29/2015 FINAL  Final    Coagulation Studies: No results for input(s): LABPROT, INR in the last 72 hours.  Urinalysis: No results for input(s): COLORURINE, LABSPEC, PHURINE, GLUCOSEU, HGBUR, BILIRUBINUR, KETONESUR, PROTEINUR, UROBILINOGEN, NITRITE, LEUKOCYTESUR in the last 72 hours.  Invalid input(s): APPERANCEUR    Imaging: No results found.   Medications:   . feeding supplement (VITAL AF 1.2 CAL) 1,000 mL (10/05/15 0644)   . albumin human  25 g Intravenous Once  . antiseptic oral rinse  7 mL Mouth Rinse q12n4p  . chlorhexidine  15 mL Mouth Rinse BID  . epoetin (EPOGEN/PROCRIT) injection  10,000 Units Intravenous Q M,W,F-HD  . feeding supplement (PRO-STAT SUGAR FREE 64)  30 mL Per Tube 6 times per day  . folic acid  1 mg Oral Daily  . free water  20 mL Per Tube 6 times per day  . heparin subcutaneous  5,000 Units Subcutaneous Q12H  . hydrocortisone  10 mg Per Tube BID  . insulin aspart  0-9 Units Subcutaneous 6 times per day  . ipratropium-albuterol  3 mL Nebulization QID  . lidocaine  1 patch Transdermal Q24H  . loperamide  2 mg Oral Daily  . megestrol  400 mg Oral BID  . midodrine  5 mg Per Tube TID WC  . multivitamin  5 mL Oral Daily  . pantoprazole sodium  40 mg Per Tube Daily  . sodium chloride  10-40 mL Intracatheter Q12H  . sodium hypochlorite   Irrigation BID  . sulfamethoxazole-trimethoprim  2.5 tablet Per Tube q1800  . thiamine IV  100 mg Intravenous Daily    sodium chloride, sodium chloride, acetaminophen (TYLENOL) oral liquid 160 mg/5 mL, HYDROmorphone (DILAUDID) injection, Menthol-Zinc Oxide, morphine injection, ondansetron (ZOFRAN) IV, oxyCODONE, sodium chloride  Assessment/ Plan:  49 y.o. 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 Admitted on 08/13/2015  1. End-stage renal disease on hemodialysis on HD MWF. The patient has been on dialysis since October of 2014. R IJ permcath.  Tolerated dialysis treatment with IV albumin for oncotic support.  - Pt due for HD again today, orders have been prepared, UF target 1kg.  2. Anemia of CKD: Hemoglobin up to 9.1 yetserday, continue Epogen 10,000 units with dialysis.  3. Secondary hyperparathyroidism: PTH low at 55 -continue to periodically check phos, recently phos has been well controlled.   4. Sepsis: VRE in blood. Bone cultures E. Coli and klebsiella - Treatment as per ID and ICU team: daptomycin to be completed on 11/10 and now started on trimethoprim/sulfamethoxazole.   5. Generalized Dependent edema/Anasarca due to 3rd spacing - Continue to use albumin with dialysis and gentle UF with HD.  6. Acute resp failure - reintubated 09/25/15, extubated on 11/10 -Currently off the ventilator.  LOS: 54 Jenner Rosier 11/16/201610:00 AM

## 2015-10-05 NOTE — Progress Notes (Signed)
Speech Language Pathology Treatment: Dysphagia;Passy Muir Speaking valve  Patient Details Name: Courtney [name] MRN: [id number] DOB: 08-10-1966 Today's Date: 10/05/2015 Time: 1101-1200 SLP Time Calculation (min) (ACUTE ONLY): 59 min  Assessment / Plan / Recommendation Clinical Impression  PMV Tx: pt seen for assessment of toleration of PMV; pt explained use of PMV for verbalizations/phonations in order to communicate her needs/thoughts to others. Pt initially shook her head "NO" when PMV was to be placed but then did not respond once given encouragement to wear. Trach cuff is deflated(baseline) and pt is able to tolerate such. PMV placed w/out difficulty. No significant increase in HR and no O2 desat or increased RR noted - min. increased respiratory effort c/b abdominal movement during breathing but this did not increase more w/ placement of the PMV. During wear, pt did not make any attempts to verbalize or phonate despite given max. verbal cues and model by SLP; no movements of lips or mouth noted. After ~20 mins. of placement, PMV was removed and pt allowed to rest. NSG and MD updated w/ rec./plan to wear the PMV during any po intake or family/visitors present - strict NSG supervision w/ PMV placement/wear. Encouragement to verbalize/communicate during PMV wear is rec'd.  Dysphagia Tx: while pt had the PMV placed, assessment of toleration of po's was conducted. Pt was NPO during the time of vent support; she continues w/ the NG placement. Per earlier MBSS results(w/ NG in place), pt was able to safely tolerate trials of puree foods and Nectar consistency liquids w/ strict aspiration precautions. Pt nodded her head "YES" to "something to swallow to wet your mouth" and was presented w/ TSP trial of nectar consistency liquid(juice). She swallowed the bolus trial w/ no overt oral phase deficits noted but ~2 multiple swallows noted post initial swallow. Unable to assess vocal quality d/t nonverbal;  no changes in ANS noted. When offered a second trial, pt shook her head "NO". When offered trials of puree(applesauce, pudding), pt shook her head "NO". Unable to assess toleration of po's further d/t pt's lack of desire to take anything po. Rec. attempt at pleasure po's by NSG in order to gage pt's desire for initiating TID meals - it would not be beneficial to force food on pt when she doesn't want it. If pt continues to take po's w/ NSG, then a diet could be ordered. Pt MUST wear the PMV w/ ANY po intake. NSG and MD updated and agreed.    HPI HPI: Pt is a 65 F who has been in medical facilities (hosp, LTAC, rehab) for 2 yrs following gastric bypass surgery with multiple complications. Now with chronic trach, ESRD, profound debilitation, severe sacral pressure ulcer. Was seemingly making progress and transferred to rehab facility approx one week prior to this admission. She was sent to [facility] ED with AMS and hypotension. Working dx of severe sepsis/septic shock due to infected sacral pressure ulcer. Also has R side externalized HD cath and tunneled L IJ CVL. She has history of resistant bacterial infections. She is on chronic steroids. Her husband indicates that she has been diagnosed with adrenal insuff at some point during the past 2 yrs. 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 Upon discussion w/ husband, pt was eating an oral diet at [employer] WITH the trach Capped; tolerated this adequately w/ no deficits. Pt has been assessed w/ PMV 09/02/15 during this admission and passed. She has tolerated placement and wear of PMV during tx  sessions, primarily w/ dysphagia tx sessions. Pt has been responsive to verbal stim at times keeping her eyes opened and looking toward SLP but at other sessions, pt has closed her eyes appeared to shut down to any interactions. Pt was placed back on vent ~10 days ago post dialysis session. Now back on TCT for ~1 week now. Pt has been able to  tolerate cuff deflation. Pt continues to have an NG in place for TFs for nutrition but was refusing po's on several occasions prior to, and post, recent return to vent support.       SLP Plan  Continue with current plan of care     Recommendations  Diet recommendations: Dysphagia 1 (puree);Nectar-thick liquid (pleasure po's from trays at this time) Liquids provided via: Teaspoon Medication Administration: Via alternative means (NG) Supervision: Staff to assist with self feeding;Full supervision/cueing for compensatory strategies;Trained caregiver to feed patient Compensations: Minimize environmental distractions;Slow rate;Small sips/bites;Multiple dry swallows after each bite/sip Postural Changes and/or Swallow Maneuvers: Seated upright 90 degrees (reflux precautions)      Patient may use Passy-Muir Speech Valve: Intermittently with supervision (may wear w/ NSG/family for few mins at a time; supervised) PMSV Supervision: Full       General recommendations: Rehab consult (TBD; Dietician following) Oral Care Recommendations: Oral care QID;Staff/trained caregiver to provide oral care Follow up Recommendations: Skilled Nursing facility (TBD) Plan: Continue with current plan of care    Jerilynn SomKatherine Darsha Zumstein, MS, [name]  Courtney [name] 10/05/2015, 3:53 PM

## 2015-10-06 LAB — GLUCOSE, CAPILLARY
GLUCOSE-CAPILLARY: 134 mg/dL — AB (ref 65–99)
GLUCOSE-CAPILLARY: 109 mg/dL — AB (ref 65–99)
GLUCOSE-CAPILLARY: 146 mg/dL — AB (ref 65–99)
Glucose-Capillary: 119 mg/dL — ABNORMAL HIGH (ref 65–99)
Glucose-Capillary: 156 mg/dL — ABNORMAL HIGH (ref 65–99)

## 2015-10-06 MED ORDER — AMANTADINE HCL 100 MG PO CAPS
100.0000 mg | ORAL_CAPSULE | Freq: Two times a day (BID) | ORAL | Status: DC
  Administered 2015-10-06 – 2015-10-10 (×10): 100 mg
  Filled 2015-10-06 (×11): qty 1

## 2015-10-06 MED ORDER — ASPIRIN 81 MG PO CHEW
81.0000 mg | CHEWABLE_TABLET | Freq: Every day | ORAL | Status: DC
  Administered 2015-10-06 – 2015-11-22 (×48): 81 mg via ORAL
  Filled 2015-10-06 (×48): qty 1

## 2015-10-06 NOTE — Progress Notes (Signed)
NEUROLOGY NOTE  S: Not really following today, no family at bedside  ROS unobtainable to aphasia  O: 98.1 114/60 96 18 No distress, overweight Normocephalic, oropharynx clear Supple, no JVD CTA B, no wheezing tachycardic, no murmur Diffuse edema, no C/C  Sleepy, opens and tracks but does not follow today Pupils 4mm and sluggish, weak R corneal, good L corneal, R droop Localizes on L, trace movement on R  A/P: 1. Multiple embolic acute infarcts- stable, Etiology still appears to be cardioembolic to me. This will still cause permanent deficits of R hemiparesis and aphasia which have improved some since last time seen by me 2. Encephalopathy-worsened today but these can often fluctuate - Start ASA 81mg  daily - no need for amantadine 100mg  BID PO or thiamine now but can restart if pt is not waking up - did not see Hem/Onc consult and would consider - Try to keep Hgb > 8 - Keep MAP > 60 for good perfusion - Will update husband over the phone - Will sign off, please call with questions - Needs outpatient f/u in 3 months

## 2015-10-06 NOTE — Consult Note (Signed)
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 Right trochanter Stage III and right lateral foot near fifth metatarsal, unstageable injury. All present on admission  Measurement:Sacrum: 20 cm x 15 cm x 4 cm with palpable bone in wound bed.90% pale pink tissue and 10% muscle/tendon. Undermining present from 8 to 12 o'clock, extending 1 cm. Impressive debridement has occurred since I last assessed the wound.  Right lateral foot 100% soft slough. Some autolysis occuring. Right trochanter 3.6 cm x 1 cm x 0.1 cm 10% devitalized tissue 90% pale pink tissue.   Drainage (amount, consistency, odor) Moderate serosanguinous drainage sacrum Minimal serosanguinous right foot.  Minimal serosanguinous to right hip  Periwound:INtact at this time.  Dressing procedure/placement/frequency: Cleanse right foot daily with NS and apply dry dressing.  Cleanse right hip with NS and pat gently dry. Apply Allevyn silicone border foam. Change every 3 days and PRN soilage Cleanse sacral ulcer with NS and pat gently dry. Gently fill wound bed with NS moistened gauze. Cover with 4x4 gauze and ABD pad. Change each shift and PRN soilage. Will discontinue Dakin's since wound is clean at this time.  WOC team will not follow but will remain available if needed further.  Maple HudsonKaren Abdoulie Tierce RN BSN CWON Pager [phone]

## 2015-10-06 NOTE — Evaluation (Signed)
Physical Therapy Re-Evaluation Patient Details Name: Courtney [name] MRN: [id number] DOB: 09-02-1966 Today's Date: 10/06/2015   History of Present Illness  Pt is a 62 F who has been in medical facilities (hosp, LTAC, rehab) for 2 yrs following gastric bypass surgery with multiple complications. Now with chronic trach, ESRD, profound debilitation, severe sacral pressure ulcer (stage IV). Was seemingly making progress and transferred to rehab facility approx one week prior to this admission. She was sent to [facility] ED 9/23 with AMS and hypotension. Admitted with sepsis related to infected sacral ulcer.  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  Patient transferred to CCU to med-surg floor during hospitalization, but experienced profound interdialytic hypotension requiring return transfer to CCU for use of pressors (now off); remains in CCU.  Noted with R hip intertrochanteric fracture, recommended for conservative management per orthopedics.  Hospital course continues to be complicated with patient requiring return to vent 11/5 for respiratory decline with use of pressors (now off); extubated to trach collar 11/10; limited ability/willingness to follow commands or actively participate in therapeutic interventions.  Per care team, physicians recommending hospice care/comfort measures at this time; husband considering goals of care/POC pending discussion with patient and other family members, but wishes continued medical care at this time. Will carefully monitor medical plan and POC to evaluate appropriateness of continued PT throughout remaining hospitalization.  Clinical Impression  Upon evaluation, patient does open eyes to voice/touch when therapist enters room, but does not visually track (in fact, tends to maintain gaze and head position away from  therapist) or attempt interaction with therapist.  Appears generally disinterested in therapy efforts.  Demonstrates absent active movement in bilat UEs, L LE this date (despite previously noted active strength in L UE) and shows very limited/no effort with therapeutic interventions.  R LE remains immobilized/NWB due to R hip fracture.  All interventions purely passive in nature this date.  Additional mobility assessment or progression beyond bed-level therex contraindicated at this time due to multiple medical co-morbidities. Does appear to indicate some degree of discomfort with flexion of L hip, though unable to localize/describe/quantify further.  Appears to resolve with repositioning to neutral. Provided patient will demonstrate some degree of active effort and is medically appropriate for continued services, skilled PT will continue with emphasis on bilat UE and L LE ROM for strengthening (as able), contracture prevention and prevention of further skin breakdown. Per Dr. Clovis Fredrickson request, will design LE HEP for husband to complete with patient during his visits.  If husband and medical team demonstrate ability to provide passive ROM and maintenance care (in absence of active effort/participation by patient) on consistent basis, will consider discontinuation of PT services during remaining hospitalization. At this time, will extend POC additional two weeks and continue to provide services 2x during weekdays and 1x on weekend as able and appropriate.   Will closely monitor response to and participation with treatment and overall medical condition, goals of care to ensure appropriateness of continued PT efforts.   Follow Up Recommendations LTACH;SNF    Equipment Recommendations       Recommendations for Other Services       Precautions / Restrictions Precautions Precautions: Fall Precaution Comments: stage 4 sacral ulcer, NG tube/NPO, trach, contact isolation, R IJ permcath, L IJ central line, log  roll only (as needed for dressing changes/hygiene) and no ROM to R LE/no attempts at sitting  Restrictions Weight Bearing Restrictions: Yes RLE Weight Bearing: Non weight bearing Other Position/Activity Restrictions: No ROM R LE      Mobility  Bed Mobility               General bed mobility comments: Deferred/contraindicated  Transfers                 General transfer comment: Deferred/contraindicated  Ambulation/Gait                Stairs            Wheelchair Mobility    Modified Rankin (Stroke Patients Only)       Balance Overall balance assessment:  (deferred/contraindicated due to medical condition)                                           Pertinent Vitals/Pain Pain Assessment: Faces Faces Pain Scale: Hurts little more Pain Location: L hip/back/LE? (patient unable to localize, describe, etc) Pain Descriptors / Indicators: Grimacing Pain Intervention(s): Limited activity within patient's tolerance;Monitored during session;Repositioned    Home Living Family/patient expects to be discharged to:: Skilled nursing facility                 Additional Comments: patient unable to provide information regarding PLOF or level of participation/function prior to hospitalization    Prior Function           Comments: patient unable to provide information regarding PLOF or level of participation/function prior to hospitalization; will verify from family/resources as appropriate     Hand Dominance        Extremity/Trunk Assessment   Upper Extremity Assessment:  (patient without active movement noted in either upper extremity this date, minimal/no attempts to initiate or participate with active assist therex of L UE (previously documented strength/active effort).  UEs less edematous than noted during previous ses)           Lower Extremity Assessment:  (L LE grossly flaccid without attempts at active movement  noted.  L ankle DF to neutral, L hip/knee flex to approx 70 degrees (limited by pain, indicated by facial grimacing).  Globally deconditioned.  R LE immobilized/NT secondary to R hip fracture)         Communication   Communication: Tracheostomy  Cognition Arousal/Alertness:  (awake, eyes opened during session) Behavior During Therapy: Flat affect (limited/no interaction or engagement with therapist.  Makes occasional eye contact, but does not track or make attempts at communcation by any method) Overall Cognitive Status: Difficult to assess                      General Comments      Exercises Other Exercises Other Exercises: Passive ROM to bilat UEs, 1x12: gross grasp/release, wrist flex/ext, forearm pronation/supination, elbow flex/ext/abduct/adduct, shoulder IR/ER. Absent active effort this date. Other Exercises: Passive ROM to L LE, 1x12: ankle pumps, ankle circumduction, hip/knee flex/ext, hip abduct/adduct.  Absent active movement noted L LE this date. Other Exercises: Does open eyes to voice/touch when therapist enters room, but does not visually track (in fact, tends to maintain gaze and head position away from therapist) or attempt interaction with therapist.  Appears generally disinterested in therapy efforts.      Assessment/Plan    PT Assessment Patient needs continued PT services  PT Diagnosis Difficulty walking;Generalized weakness;Acute pain;Hemiplegia non-dominant side;Hemiplegia dominant side  PT Problem List Decreased strength;Decreased range of motion;Decreased activity tolerance;Decreased balance;Decreased mobility;Decreased coordination;Decreased cognition;Decreased knowledge of use of DME;Decreased safety awareness;Decreased knowledge of precautions;Cardiopulmonary status limiting activity;Impaired sensation;Decreased skin integrity;Pain  PT Treatment Interventions Therapeutic exercise;Manual techniques;Patient/family education;Therapeutic  activities;Neuromuscular re-education;Cognitive remediation   PT Goals (Current goals can be found in the Care Plan section) Acute Rehab PT Goals Patient Stated Goal: patient unable to verbalize/state goal PT Goal Formulation: Patient unable to participate in goal setting Time For Goal Achievement: 10/20/15 Potential to Achieve Goals: Poor    Frequency Min 2X/week   Barriers to discharge Decreased caregiver support      Co-evaluation               End of Session   Activity Tolerance: Treatment limited secondary to medical complications (Comment) Patient left: in bed;with call bell/phone within reach           Time: 1503-1519 PT Time Calculation (min) (ACUTE ONLY): 16 min   Charges:   PT Evaluation $PT Re-evaluation: 1 Procedure PT Treatments $Therapeutic Exercise: 8-22 mins   PT G Codes:        Rahmah Mccamy H. Manson PasseyBrown, PT, DPT, NCS 10/06/2015, 3:46 PM [phone]

## 2015-10-06 NOTE — Clinical Social Work Note (Signed)
Nothing new for CSW to add at this point. Medical plan continues to be worked up. York SpanielMonica Cloe Sockwell MSW,LCSW [phone]

## 2015-10-06 NOTE — Progress Notes (Signed)
[facility] Critical Care Medicine Progess Note  Name: Courtney [name] MRN: [id number] DOB: 09/13/1966    ADMISSION DATE:  2015/09/01     INITIAL PRESENTATION:  49 AAF who has been in medical facilities (hosp, LTAC, rehab) for 2 yrs following gastric bypass surgery with multiple complications. Now with chronic trach, ESRD, profound debilitation, severe sacral pressure ulcer. Was seemingly making progress and transferred to rehab facility approx one week prior to this admission. She was sent to [facility] ED with AMS and hypotension. Working dx of severe sepsis/septic shock due to infected sacral pressure ulcer. Since admission. Her course is been very complicated with numerous complications including septic shock and GI bleeding. Had to be placed on vent due to excessive secretions, now on TCT-still with some secretions Patient refusing to take oral feeds, PT,ST and Neuro and Wound Care to follow up Continue supportive care and management, no new events from last night  INTERVAL HISTORY: Placed on vasopressors and placed back on vent 11/5, but was able to wean off levophed on 11/6 afternoon.   [name] changed out due to cuff leak on  11/5 PM, #6 Shiley cuffed, patient refusing oral intake  The patient was started on Megace and thiamine, on 09/22/2015 per the husband's request.  11/7>>husband at bedside updated on current plan. Plan for HD today, cont with TF, cont with daptomycin, awaiting bone cx and s/s from trach aspirate. Pressure in pilot balloon of trach in the 50s, if cont to be this high, might need to upsize trach.    11/8> no acute events overnight, mild thin secretions noted, was on the vent at 35% FiO2 during the night. Creatinine slightly worse compared to yesterday; had dialysis yesterday, 1 L removed. Sacral Bone culture shows Klebsiella pneumonia and Escherichia coli  11/9> placed on ventilator rate last night, today tolerating dialysis, goal removal is 1 L, we'll attempt  trach collar trials today, patient more interactive, will open eyes and nod. Patient is currently on Septra and daptomycin.  11/10>tolerated about 8 hrs off TCT yesterday, placed back on vent overnight, somnolent this morning but more alert as the day progresses.  HD with 1 liter removed yesterday, BP stable, not requiring pressors. RT report inc secretion, will obtain CXR and labs today.  11/14 currently on TCT, complains of back pain   MAJOR EVENTS/TEST RESULTS: Admission 02/07/14-05/07/14 Admission 07/21/14-09/06/14 Discharged to [facility]. Pt had palliative consult at that time, were asked to sign off by husband.  09/23 CT head: NAD 09/23 EEG: no epileptiform activity 09/23 PRBCs for Hgb 6.4 09/24 bedside debridement of sacral wound. Abscess drained 09/25 Off vasopressors. More alert. No distress. Worsening thrombocytopenia. Vanc DC'd 09/29  Dr. Sampson Goon (I.D) excused from the case by patient's husband. 10/02 MRI -multiple infarcts 10/03 tracheal bleeding- transfused platelets 10/03 hospitalist service excused from the case by patient's husband 10/03 Echocardiogram ejection fraction was 55-60%, pulmonary systolic pressure was 39 mmHg 78/29 restart TF's at lower rate, attempt reg HD  10/12 Transferred to med-surg floor. Remains on PCCM service 10/14 SLP eval: pt unable to tolerate PMV adequately 10/17-will re-attempt PM valve-discussed with Speech therapist 10/18 passed swallow eval-start pureed thick foods no thin liquids-continue NG feeds 10/19 transferred to step down for sepsis/aspiration pneumonia 10/19 cxr shows RLL opacity 10/21 sacral decub debride at bedside by surgery 10/22 started back on vasopressors while on HD 10/27 CT with osteo, R hip fx, unable to identify tip of dubhoff tube - sent for fluoro study 10/28 Ortho consultation: I do  not feel that she is a surgical candidate. Therefore, I feel that it would best to manage this fracture nonsurgically and allow it to heal by  itself over time, which it should.  10/28 Gen Surg consultation: Due to the lack of free air or free flow of contrast and the peritoneum there is no indication for any surgical intervention on this. Would recommend pulling back the feeding tube 1-2 cm. Would recheck an abdominal film to confirm no pneumoperitoneum in the morning. Absence of any changes okay to continue using Dobbhoff for feeding and medications. 10/28 gastrograffin study: The study confirms that the feeding tube pes perforated through the duodenum and the tip is within a cavity that fills with injected contrast. 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 Pneumoniae, P. Mirabilis, VRE Wound 9/26 >> No growth Blood 9/23 >> NEG CDiff 9/27>>neg Stool Cx 10/15>> negative Cdiff 10/25>>neg Trach Aspirate 10/28>> light growth pseudomonas Blood 10/28 >> 1/2 GPC >>  Sputum cultures obtained 09/22/15 due to mucus plugging>>Pseudomonas BONE TISSUE Cx 11/3>>E.coli (ESBL), k. Pneumonia (daptomycin and septra)  ANTIMICROBIALS:  Aztreonam 9/23 >> 9/24 Vanc 9/23 >> 9/25 Vanc 9/26>>9/27 Daptomycin 9/27>> 10/12, 10/28>>  Meropenem 9/23 >> 10/14, 10/28 >> 10/31 Septra 11/8>>   VITAL SIGNS: Temp:  [98 F (36.7 C)-98.7 F  (37.1 C)] 98 F (36.7 C) (11/17 0700) Pulse Rate:  [95-107] 105 (11/17 1100) Resp:  [0-28] 25 (11/17 1100) BP: (100-136)/(39-79) 126/50 mmHg (11/17 1100) SpO2:  [95 %-100 %] 99 % (11/17 1139) FiO2 (%):  [35 %] 35 % (11/17 1139) Weight:  [213 lb 13.5 oz (97 kg)] 213 lb 13.5 oz (97 kg) (11/17 0429) HEMODYNAMICS:   VENTILATOR SETTINGS: Vent Mode:  [-]  FiO2 (%):  [35 %] 35 % INTAKE / OUTPUT:  Intake/Output Summary (Last 24 hours) at 10/06/15 1445 Last data filed at 10/06/15 0000  Gross per 24 hour  Intake    550 ml  Output  -1000 ml  Net   1550 ml    PHYSICAL EXAMINATION: Physical Examination:   VS: BP 126/50 mmHg  Pulse 105  Temp(Src) 98 F (36.7 C) (Oral)  Resp 25  Ht  (1.753 m)  Wt 213 lb 13.5 oz (97 kg)  BMI 31.57 kg/m2  SpO2 99%  LMP  (LMP Unknown)  General Appearance: No resp distress, coarse upper airway sounds, off vent, on TCT Neuro: profoundly diffusely weak, alert today, nodding head HEENT: cushingoid facies, PERRLA, EOM intact Neck: trach site clean Pulmonary: clear anteriorly Cardiovascular: reg, + syst murmur Abdomen: soft, NT, +BS Extremities: warm, R>L UE edema Examination of the sacral decubitus ulcer- showed a very deep ulcer approximately 7-8 cm across.   stage IV ulcer.   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 BMET intermittently Monitor I/Os Correct electrolytes as  indicated Intermittent HD per Renal Service (MWF-dialysis today -check CXR today and labs (CBC, BMP, ABG). As needed  GASTROINTESTINAL A:  LGIB, resolved Hx of C. Diff Stool incontinence  P:  Cont PPI Resume TFs 10/30 goal now at 50 cc/hr  HEMATOLOGIC A:  Acute on chronic anemia S/p Thrombocytopenia - HIT panel negative 9/25, resolved P: DVT px: SCDs + SQ heparin Monitor CBC intermittently Transfuse per usual ICU guidelines  INFECTIOUS A:  Recurrent Severe sepsis, due to sacral decub, currently stable.  Sacral decubitus ulcer with abscess - on Septra and daptomycin ID actively following along, appreciate recs - currently on daptomycin & septra P:  Monitor temp, WBC count Micro and abx as above -follow ID recs  ENDOCRINE A:  -DM 2, controlled. The patient developed some hypoglycemia when the tube feed was stopped and therefore she was started on D10 because she did not take in an adequate amount of calories to maintain her blood glucose levels in the normal range.  -Chronic steroid therapy, for a history of of adrenal insufficiency. P:  Monitor CBGs q 8 hrs - resume SSI if > 180 Cont hydrocortisone @ 10 mg BID Continue  tube feeds. We'll need to monitor blood glucose levels.  MSK A: Right hip fx - small intertrochanteric R fx - pain management. Ortho evaluated and felt not a surgical candidate at this time.   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  The Patient requires high complexity decision making for assessment and support, frequent  evaluation and titration of therapies, application of advanced monitoring technologies and extensive interpretation of multiple databases. Critical Care Time devoted to patient care services described in this note is 35 minutes.   Overall, patient is critically ill, prognosis is guarded.    Lucie LeatherKurian David Omayra Tulloch, M.D.  [facility]  Medical Director [facility] Medical Director [facility]

## 2015-10-07 LAB — GLUCOSE, CAPILLARY
GLUCOSE-CAPILLARY: 120 mg/dL — AB (ref 65–99)
GLUCOSE-CAPILLARY: 125 mg/dL — AB (ref 65–99)
GLUCOSE-CAPILLARY: 142 mg/dL — AB (ref 65–99)
GLUCOSE-CAPILLARY: 188 mg/dL — AB (ref 65–99)
Glucose-Capillary: 137 mg/dL — ABNORMAL HIGH (ref 65–99)
Glucose-Capillary: 143 mg/dL — ABNORMAL HIGH (ref 65–99)
Glucose-Capillary: 158 mg/dL — ABNORMAL HIGH (ref 65–99)

## 2015-10-07 LAB — RENAL FUNCTION PANEL
ALBUMIN: 2.6 g/dL — AB (ref 3.5–5.0)
ANION GAP: 7 (ref 5–15)
BUN: 64 mg/dL — ABNORMAL HIGH (ref 6–20)
CALCIUM: 8.4 mg/dL — AB (ref 8.9–10.3)
CO2: 34 mmol/L — ABNORMAL HIGH (ref 22–32)
Chloride: 100 mmol/L — ABNORMAL LOW (ref 101–111)
Creatinine, Ser: 0.98 mg/dL (ref 0.44–1.00)
GFR calc Af Amer: >60 mL/min (ref >60)
GLUCOSE: 146 mg/dL — AB (ref 65–99)
PHOSPHORUS: 3.4 mg/dL (ref 2.5–4.6)
Potassium: 4.3 mmol/L (ref 3.5–5.1)
SODIUM: 141 mmol/L (ref 135–145)

## 2015-10-07 LAB — CBC
HEMATOCRIT: 28.5 % — AB (ref 35.0–47.0)
HEMOGLOBIN: 8.8 g/dL — AB (ref 12.0–16.0)
MCH: 32.1 pg (ref 26.0–34.0)
MCHC: 31 g/dL — AB (ref 32.0–36.0)
MCV: 103.4 fL — ABNORMAL HIGH (ref 80.0–100.0)
Platelets: 201 10*3/uL (ref 150–440)
RBC: 2.76 MIL/uL — AB (ref 3.80–5.20)
RDW: 18.7 % — ABNORMAL HIGH (ref 11.5–14.5)
WBC: 11.7 10*3/uL — ABNORMAL HIGH (ref 3.6–11.0)

## 2015-10-07 LAB — C DIFFICILE QUICK SCREEN W PCR REFLEX
C DIFFICILE (CDIFF) INTERP: NEGATIVE
C DIFFICILE (CDIFF) TOXIN: NEGATIVE
C Diff antigen: NEGATIVE

## 2015-10-07 MED ORDER — DEXTROSE 5 % IV SOLN: 0.0000 ug/min | INTRAVENOUS | Status: DC

## 2015-10-07 MED ORDER — ALBUMIN HUMAN 25 % IV SOLN
25.0000 g | Freq: Once | INTRAVENOUS | Status: AC
  Administered 2015-10-07: 25 g via INTRAVENOUS
  Filled 2015-10-07: qty 100

## 2015-10-07 MED ORDER — NOREPINEPHRINE 4 MG/250ML-% IV SOLN
0.0000 ug/min | INTRAVENOUS | Status: DC
  Administered 2015-10-07: 5 ug/min via INTRAVENOUS
  Administered 2015-11-09: 2 ug/min via INTRAVENOUS
  Administered 2015-11-16: 4 ug/min via INTRAVENOUS
  Filled 2015-10-07 (×5): qty 250

## 2015-10-07 NOTE — Progress Notes (Signed)
   10/07/15 1100  Clinical Encounter Type  Visited With Patient and family together  Visit Type Follow-up  Consult/Referral To Chaplain  Spiritual Encounters  Spiritual Needs Emotional  Stress Factors  Patient Stress Factors None identified  Family Stress Factors None identified  Chaplain rounded in the unit and went in and offered a compassionate presence and support to family. Chaplain Thekla Colborn A. Athalene Kolle Ext. [phone]

## 2015-10-07 NOTE — Progress Notes (Signed)
Subjective:  Patient had change in respiratory pattern this a.m. Patient to be placed back on ventilator. She is also due for hemodialysis today.   Objective:  Vital signs in last 24 hours:  Temp:  [97.6 F (36.4 C)-98.7 F (37.1 C)] 97.8 F (36.6 C) (11/18 0000) Pulse Rate:  [97-108] 104 (11/18 0600) Resp:  [7-29] 27 (11/18 0600) BP: (116-136)/(45-94) 130/75 mmHg (11/18 0600) SpO2:  [91 %-100 %] 98 % (11/18 0600) FiO2 (%):  [35 %-40 %] 40 % (11/17 2140)  Weight change:  Filed Weights   10/04/15 1230 10/05/15 0400 10/06/15 0429  Weight: 97 kg (213 lb 13.5 oz) 100 kg (220 lb 7.4 oz) 97 kg (213 lb 13.5 oz)    Intake/Output: I/O last 3 completed shifts: In: 2240 [NG/GT:2240] Out: -    Intake/Output this shift:  Total I/O In: 20 [I.V.:20] Out: -   Physical Exam: General: No acute distress, chronically ill appearing  Head: NG in place, OM moist  Eyes: anicteric  Neck: Tracheostomy in place  Lungs:  Scattered rhonchi, normal effort  Heart: S1S2 +tachycardia  Abdomen:  Soft, nontender, BS present  Extremities: + dependent edema, anasarca  Neurologic: Lethargic today  Access:  R IJ permcath.       Basic Metabolic Panel:  Recent Labs Lab 09/30/15 1828 10/03/15 0412 10/04/15 0900  NA 143 144 140  K 4.2 4.3 3.9  CL 103 103 102  CO2 33* 34* 33*  GLUCOSE 130* 149* 107*  BUN 71* 82* 62*  CREATININE 1.36* 1.39* 1.08*  CALCIUM 8.4* 8.3* 8.2*  MG  --  1.7 1.8  PHOS 3.7 4.2 3.4    Liver Function Tests:  Recent Labs Lab 09/30/15 1828  ALBUMIN 1.8*   No results for input(s): LIPASE, AMYLASE in the last 168 hours. No results for input(s): AMMONIA in the last 168 hours.  CBC:  Recent Labs Lab 09/30/15 1828 10/03/15 0412 10/04/15 0900  WBC 10.0 12.2* 12.6*  HGB 9.1* 8.5* 9.1*  HCT 28.5* 27.4* 29.6*  MCV 100.3* 103.4* 102.7*  PLT 171 183 205    Cardiac Enzymes: No results for input(s): CKTOTAL, CKMB, CKMBINDEX, TROPONINI in the last 168  hours.  BNP: Invalid input(s): POCBNP  CBG:  Recent Labs Lab 10/06/15 1610 10/06/15 2004 10/07/15 0017 10/07/15 0419 10/07/15 0739  GLUCAP 109* 146* 120* 137* 143*    Microbiology: Results for orders placed or performed during the hospital encounter of 07/29/2015  Blood Culture (routine x 2)     Status: None   Collection Time: 08/07/2015  8:51 AM  Result Value Ref Range Status   Specimen Description BLOOD [name]  Final   Special Requests BOTTLES DRAWN AEROBIC AND ANAEROBIC  3CC  Final   Culture NO GROWTH 5 DAYS  Final   Report Status 08/17/2015 FINAL  Final  Blood Culture (routine x 2)     Status: None   Collection Time: 08/14/2015  9:20 AM  Result Value Ref Range Status   Specimen Description BLOOD LEFT ARM  Final   Special Requests BOTTLES DRAWN AEROBIC AND ANAEROBIC  1CC  Final   Culture NO GROWTH 5 DAYS  Final   Report Status 08/17/2015 FINAL  Final  Wound culture     Status: None   Collection Time: 07/31/2015  9:20 AM  Result Value Ref Range Status   Specimen Description DECUBITIS  Final   Special Requests Normal  Final   Gram Stain   Final    FEW WBC SEEN MANY GRAM  NEGATIVE RODS RARE [id number] ime: 08/13/15 12:37 PM  Result Value Ref Range Status   Specimen Description WOUND  Final   Special Requests Normal  Final   Gram Stain   Final    FEW WBC SEEN TOO NUMEROUS TO COUNT GRAM NEGATIVE RODS FEW GRAM POSITIVE COCCI    Culture   Final    HEAVY GROWTH ESCHERICHIA COLI MODE0.86ATE [facility] [place] PROTEUS  MIRABILIS LIGHT GROWTH KLEBSIELLA PNEUMONIAE MODERATE GROWTH ENTEROCOCCUS GALLINARUM CRITICAL RESULT CALLED TO, READ BACK BY AND VERIFIED WITH: [facility] BORBA AT 1042 08/16/15 DV    Report Status 08/17/2015 FINAL  Final   Organism ID, Bacteria ESCHERICHIA COLI  Final   Organism ID, Bacteria PROTEUS MIRABILIS  Final   Organism ID, Bacteria KLEBSIELLA PNEUMONIAE  Final   Organism ID, Bacteria ENTEROCOCCUS GALLINARUM  Final      Susceptibility   Escherichia coli - MIC*    AMPICILLIN >=32 RESISTANT Resistant     CEFTAZIDIME 4 RESISTANT Resistant     CEFAZOLIN >=64 RESISTANT Resistant     CEFTRIAXONE 16 RESISTANT Resistant     GENTAMICIN 2 SENSITIVE Sensitive     IMIPENEM >=16 RESISTANT Resistant     TRIMETH/SULFA <=20 SENSITIVE Sensitive     Extended ESBL POSITIVE Resistant     PIP/TAZO Value in next row Resistant      RESISTANT>=128    CIPROFLOXACIN Value in next row Sensitive      SENSITIVE<=0.25    * HEAVY GROWTH ESCHERICHIA COLI   Klebsiella pneumoniae - MIC*    AMPICILLIN Value in next row Resistant      SENSITIVE<=0.25    CEFTAZIDIME Value in next row Resistant      SENSITIVE<=0.25    CEFAZOLIN Value in next row Resistant      SENSITIVE<=0.25    CEFTRIAXONE Value in next row Resistant      SENSITIVE<=0.25    CIPROFLOXACIN Value in next row Resistant      SENSITIVE<=0.25    GENTAMICIN Value in next row Sensitive      SENSITIVE<=0.25    IMIPENEM Value in next row Resistant      SENSITIVE<=0.25    TRIMETH/SULFA Value in next row Resistant      SENSITIVE<=0.25    PIP/TAZO Value in next row Resistant      RESISTANT>=128    * LIGHT GROWTH KLEBSIELLA PNEUMONIAE    Proteus mirabilis - MIC*    AMPICILLIN Value in next row Resistant      RESISTANT>=128    CEFTAZIDIME Value in next row Sensitive      RESISTANT>=128    CEFAZOLIN Value in next row Sensitive      RESISTANT>=128    CEFTRIAXONE Value in next row Sensitive      RESISTANT>=128    CIPROFLOXACIN Value in next row Sensitive      RESISTANT>=128    GENTAMICIN Value in next row Sensitive      RESISTANT>=128    IMIPENEM Value in next row Sensitive      RESISTANT>=128    TRIMETH/SULFA Value in next row Sensitive      RESISTANT>=128    PIP/TAZO Value in next row Sensitive      SENSITIVE<=4    * MODERATE GROWTH PROTEUS MIRABILIS   Enterococcus gallinarum - MIC*    AMPICILLIN Value in next row Resistant      SENSITIVE<=4    GENTAMICIN SYNERGY Value in next row Sensitive      SENSITIVE<=4    CIPROFLOXACIN Value in next row Resistant      RESISTANT>=8    TETRACYCLINE Value in next row Resistant      RESISTANT>=16    * MODERATE GROWTH ENTEROCOCCUS GALLINARUM  Wound culture     Status: None   Collection Time: 08/15/15  2:53 PM  Result Value Ref Range Status   Specimen Description WOUND  Final   Special Requests NONE  Final   Gram Stain FEW  WBC SEEN NO ORGANISMS SEEN   Final   Culture NO GROWTH 3 DAYS  Final   Report Status 08/18/2015 FINAL  Final  C difficile quick scan w PCR reflex     Status: None   Collection Time: 08/17/15 11:34 AM  Result Value Ref Range Status   C Diff antigen NEGATIVE NEGATIVE Final   C Diff toxin NEGATIVE NEGATIVE Final   C Diff interpretation Negative for C. difficile  Final  Stool culture     Status: None   Collection Time: 09/03/15  3:51 PM  Result Value Ref Range Status   Specimen Description STOOL  Final   Special Requests Immunocompromised  Final   Culture   Final    NO SALMONELLA OR SHIGELLA ISOLATED No Pathogenic E. coli detected NO CAMPYLOBACTER DETECTED    Report Status 09/07/2015 FINAL  Final  C difficile quick scan w PCR reflex     Status:  None   Collection Time: 09/13/15 12:51 AM  Result Value Ref Range Status   C Diff antigen NEGATIVE NEGATIVE Final   C Diff toxin NEGATIVE NEGATIVE Final   C Diff interpretation Negative for C. difficile  Final  Culture, blood (routine x 2)     Status: None   Collection Time: 09/16/15 11:24 AM  Result Value Ref Range Status   Specimen Description BLOOD LEFT HAND  Final   Special Requests BOTTLES DRAWN AEROBIC AND ANAEROBIC  1CC  Final   Culture NO GROWTH 5 DAYS  Final   Report Status 09/21/2015 FINAL  Final  Culture, blood (routine x 2)     Status: None   Collection Time: 09/16/15 12:23 PM  Result Value Ref Range Status   Specimen Description BLOOD RIGHT HAND  Final   Special Requests BOTTLES DRAWN AEROBIC AND ANAEROBIC  1CC  Final   Culture  Setup Time   Final    GRAM POSITIVE COCCI AEROBIC BOTTLE ONLY CRITICAL RESULT CALLED TO, READ BACK BY AND VERIFIED WITH: TESS THOMAS,RN 09/17/2015 0631 BY JRS.    Culture   Final    ENTEROCOCCUS FAECALIS AEROBIC BOTTLE ONLY VRE HAVE INTRINSIC RESISTANCE TO MOST COMMONLY USED ANTIBIOTICS AND THE ABILITY TO ACQUIRE RESISTANCE TO MOST AVAILABLE ANTIBIOTICS. CRITICAL RESULT CALLED TO, READ BACK BY AND VERIFIED WITH: CHERYL SMITH AT 4098 09/19/15 DV    Report Status 09/21/2015 FINAL  Final   Organism ID, Bacteria ENTEROCOCCUS FAECALIS  Final      Susceptibility   Enterococcus faecalis - MIC*    AMPICILLIN <=2 SENSITIVE Sensitive     LINEZOLID 2 SENSITIVE Sensitive     CIPROFLOXACIN Value in next row Resistant      RESISTANT>=8    TETRACYCLINE Value in next row Resistant      RESISTANT>=16    VANCOMYCIN Value in next row Resistant      RESISTANT>=32    GENTAMICIN SYNERGY Value in next row Resistant      RESISTANT>=32    * ENTEROCOCCUS FAECALIS  Culture, respiratory (NON-Expectorated)     Status: None   Collection Time: 09/16/15  3:50 PM  Result Value Ref Range Status   Specimen Description TRACHEAL ASPIRATE  Final   Special Requests  Immunocompromised  Final   Gram Stain   Final    FEW WBC SEEN GOOD SPECIMEN - 80-90% WBCS RARE GRAM NEGATIVE RODS    Culture LIGHT GROWTH PSEUDOMONAS AERUGINOSA  Final   Report Status 09/19/2015 FINAL  Final   Organism ID, Bacteria PSEUDOMONAS AERUGINOSA  Final  Susceptibility   Pseudomonas aeruginosa - MIC*    CEFTAZIDIME 8 SENSITIVE Sensitive     CIPROFLOXACIN 2 INTERMEDIATE Intermediate     GENTAMICIN >=16 RESISTANT Resistant     IMIPENEM >=16 RESISTANT Resistant     PIP/TAZO Value in next row Sensitive      SENSITIVE32    CEFEPIME Value in next row Sensitive      SENSITIVE8    LEVOFLOXACIN Value in next row Resistant      RESISTANT>=8    * LIGHT GROWTH PSEUDOMONAS AERUGINOSA  Culture, expectorated sputum-assessment     Status: None   Collection Time: 09/22/15  2:09 PM  Result Value Ref Range Status   Specimen Description ENDOTRACHEAL  Final   Special Requests Normal  Final   Sputum evaluation THIS SPECIMEN IS ACCEPTABLE FOR SPUTUM CULTURE  Final   Report Status 09/24/2015 FINAL  Final  Culture, respiratory (NON-Expectorated)     Status: None   Collection Time: 09/22/15  2:09 PM  Result Value Ref Range Status   Specimen Description ENDOTRACHEAL  Final   Special Requests Normal Reflexed from [id number]  Final   Gram Stain   Final    FEW WBC SEEN FEW GRAM NEGATIVE RODS POOR SPECIMEN - LESS THAN 70% WBCS    Culture   Final    MODERATE GROWTH PSEUDOMONAS AERUGINOSA LIGHT GROWTH KLEBSIELLA PNEUMONIAE REFER TO SENSITIVITIES FROM PREVIOUS CULTURE FOR ORG 2    Report Status 10/01/2015 FINAL  Final   Organism ID, Bacteria PSEUDOMONAS AERUGINOSA  Final   Organism ID, Bacteria KLEBSIELLA PNEUMONIAE  Final      Susceptibility   Pseudomonas aeruginosa - MIC*    CEFTAZIDIME 4 SENSITIVE Sensitive     CIPROFLOXACIN 2 INTERMEDIATE Intermediate     GENTAMICIN >=16 RESISTANT Resistant     IMIPENEM >=16 RESISTANT Resistant     PIP/TAZO Value in next row Sensitive      SENSITIVE16     * MODERATE GROWTH PSEUDOMONAS AERUGINOSA  Tissue culture     Status: None   Collection Time: 09/24/15  6:44 AM  Result Value Ref Range Status   Specimen Description BONE  Final   Special Requests Normal  Final   Gram Stain MODERATE WBC SEEN FEW GRAM NEGATIVE RODS   Final   Culture   Final    MODERATE GROWTH ESCHERICHIA COLI MODERATE GROWTH KLEBSIELLA PNEUMONIAE ESBL-EXTENDED SPECTRUM BETA LACTAMASE-THE ORGANISM IS RESISTANT TO PENICILLINS, CEPHALOSPORINS AND AZTREONAM ACCORDING TO CLSI M100-S15 VOL.25 N01 JAN 2005. ORGANISM 1 This organism isolate is resistant to one or more antiotic agents in three or more antimicrobial categories.  Suggest Infectious Disease consult.   ORGANISM 2 CRITICAL RESULT CALLED TO, READ BACK BY AND VERIFIED WITH: RN Mardene Celeste LINDSAY 09/27/15 1005AM    Report Status 09/28/2015 FINAL  Final   Organism ID, Bacteria ESCHERICHIA COLI  Final   Organism ID, Bacteria KLEBSIELLA PNEUMONIAE  Final      Susceptibility   Escherichia coli - MIC*    AMPICILLIN >=32 RESISTANT Resistant     CEFTAZIDIME 4 RESISTANT Resistant     CEFAZOLIN >=64 RESISTANT Resistant     CEFTRIAXONE 8 RESISTANT Resistant     GENTAMICIN <=1 SENSITIVE Sensitive     IMIPENEM 8 RESISTANT Resistant     TRIMETH/SULFA <=20 SENSITIVE Sensitive     Extended ESBL POSITIVE Resistant     PIP/TAZO Value in next row Resistant      RESISTANT>=128    * MODERATE GROWTH ESCHERICHIA COLI   Klebsiella pneumoniae -  MIC*    AMPICILLIN Value in next row Resistant      RESISTANT>=128    CEFTAZIDIME Value in next row Resistant      RESISTANT>=128    CEFAZOLIN Value in next row Resistant      RESISTANT>=128    CEFTRIAXONE Value in next row Resistant      RESISTANT>=128    CIPROFLOXACIN Value in next row Resistant      RESISTANT>=128    GENTAMICIN Value in next row Resistant      RESISTANT>=128    IMIPENEM Value in next row Resistant      RESISTANT>=128    TRIMETH/SULFA Value in next row Sensitive       RESISTANT>=128    PIP/TAZO Value in next row Resistant      RESISTANT>=128    * MODERATE GROWTH KLEBSIELLA PNEUMONIAE  Anaerobic culture     Status: None   Collection Time: 09/24/15  3:55 PM  Result Value Ref Range Status   Specimen Description BONE  Final   Special Requests Normal  Final   Culture NO ANAEROBES ISOLATED  Final   Report Status 09/29/2015 FINAL  Final    Coagulation Studies: No results for input(s): LABPROT, INR in the last 72 hours.  Urinalysis: No results for input(s): COLORURINE, LABSPEC, PHURINE, GLUCOSEU, HGBUR, BILIRUBINUR, KETONESUR, PROTEINUR, UROBILINOGEN, NITRITE, LEUKOCYTESUR in the last 72 hours.  Invalid input(s): APPERANCEUR    Imaging: No results found.   Medications:   . feeding supplement (VITAL AF 1.2 CAL) 1,000 mL (10/06/15 2000)   . albumin human  25 g Intravenous Once  . amantadine  100 mg Per Tube BID  . antiseptic oral rinse  7 mL Mouth Rinse q12n4p  . aspirin  81 mg Oral Daily  . chlorhexidine  15 mL Mouth Rinse BID  . epoetin (EPOGEN/PROCRIT) injection  10,000 Units Intravenous Q M,W,F-HD  . feeding supplement (PRO-STAT SUGAR FREE 64)  30 mL Per Tube 6 times per day  . folic acid  1 mg Oral Daily  . free water  20 mL Per Tube 6 times per day  . heparin subcutaneous  5,000 Units Subcutaneous Q12H  . hydrocortisone  10 mg Per Tube BID  . insulin aspart  0-9 Units Subcutaneous 6 times per day  . ipratropium-albuterol  3 mL Nebulization QID  . lidocaine  1 patch Transdermal Q24H  . loperamide  2 mg Oral Daily  . megestrol  400 mg Oral BID  . midodrine  5 mg Per Tube TID WC  . multivitamin  5 mL Oral Daily  . pantoprazole sodium  40 mg Per Tube Daily  . sodium chloride  10-40 mL Intracatheter Q12H  . sodium hypochlorite   Irrigation BID  . sulfamethoxazole-trimethoprim  2.5 tablet Per Tube q1800  . thiamine IV  100 mg Intravenous Daily   sodium chloride, sodium chloride, acetaminophen (TYLENOL) oral liquid 160 mg/5 mL,  HYDROmorphone (DILAUDID) injection, Menthol-Zinc Oxide, morphine injection, ondansetron (ZOFRAN) IV, oxyCODONE, sodium chloride  Assessment/ Plan:  49 y.o. 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 Admitted on 03-Sep-2015  1. End-stage renal disease on hemodialysis on HD MWF. The patient has been on dialysis since October of 2014. R IJ permcath.  Tolerated dialysis treatment with IV albumin for oncotic support.  - We will proceed with hemodialysis today. We will use albumin for blood pressure support as well as midodrine.  2. Anemia of CKD: Obtain CBC today. Continue Epogen.  3. Secondary hyperparathyroidism: PTH low at 37 -We will check phosphorus today.   4. Sepsis: VRE in blood. Bone cultures E. Coli and klebsiella - Treatment as per ID and ICU team: daptomycin completed on 11/10 and now started on trimethoprim/sulfamethoxazole.   5. Generalized Dependent edema/Anasarca due to 3rd spacing - Ultrafiltration target's have been conservative. Continue blood pressure support with albumin, Midrin, and low blood flow rates.  6. Acute resp failure - Worsening breathing pattern today. Patient to be placed back on ventilator today.  LOS: 56 Courtney [name] 11/18/20169:15 AM

## 2015-10-07 NOTE — Progress Notes (Signed)
OT Cancellation Note  Patient Details Name: Courtney [name] MRN: [id number] DOB: March 16, 1966   Cancelled Treatment:    Reason Eval/Treat Not Completed: Fatigue/lethargy limiting ability to participate:  Spoke with NSG who was in the room with her and had just completed wound care and cleaning her up.  NSG requested no therapy at this time due to lethargy and to attempt again later.  Courtney [name]    Courtney [name], OTR/L ascom [phone] 10/07/2015, 10:24 AM

## 2015-10-07 NOTE — Progress Notes (Signed)
[facility] [place] [facility] Medicine Progess Note  Name: Lisette GrinderHedda McMillian Monroe MRN: [id number] DOB: 1966-08-12    ADMISSION DATE:  08/05/2015   HPI: Patient with increased WOB, will place back on vent, obtain CXR and proceed HD as needed  INITIAL PRESENTATION:  49 AAF who has been in medical facilities (hosp, LTAC, rehab) for 2 yrs following gastric bypass surgery with multiple complications. Now with chronic trach, ESRD, profound debilitation, severe sacral pressure ulcer. Was seemingly making progress and transferred to rehab facility approx one week prior to this admission. She was [facility] with AMS and hypotension. Working dx of severe sepsis/septic shock due to infected sacral pressure ulcer. Since admission. Her course is been very complicated with numerous complications including septic shock and GI bleeding. Had to be placed on vent due to excessive secretions, now on TCT-still with some secretions   INTERVAL HISTORY: Placed on vasopressors and placed back on vent 11/5, but was able to wean off levophed on 11/6 afternoon.   [name] changed out due to cuff leak on  11/5 PM, #6 Shiley cuffed, patient refusing oral intake  The patient was started on Megace and thiamine, on 09/22/2015 per the husband's request.  11/7>>husband at bedside updated on current plan. Plan for HD today, cont with TF, cont with daptomycin, awaiting bone cx and s/s from trach aspirate. Pressure in pilot balloon of trach in the 50s, if cont to be this high, might need to upsize trach.    11/8> no acute events overnight, mild thin secretions noted, was on the vent at 35% FiO2 during the night. Creatinine slightly worse compared to yesterday; had dialysis yesterday, 1 L removed. Sacral Bone culture shows Klebsiella pneumonia and Escherichia coli  11/9> placed on ventilator rate last night, today tolerating dialysis, goal removal is 1 L, we'll attempt trach collar trials today, patient more interactive, will  open eyes and nod. Patient is currently on Septra and daptomycin.  11/10>tolerated about 8 hrs off TCT yesterday, placed back on vent overnight, somnolent this morning but more alert as the day progresses.  HD with 1 liter removed yesterday, BP stable, not requiring pressors. RT report inc secretion, will obtain CXR and labs today.  11/14 currently on TCT, complains of back pain   MAJOR EVENTS/TEST RESULTS: Admission 02/07/14-05/07/14 Admission 07/21/14-09/06/14 Discharged to [facility]. Pt had palliative consult at that time, were asked to sign off by husband.  09/23 CT head: NAD 09/23 EEG: no epileptiform activity 09/23 PRBCs for Hgb 6.4 09/24 bedside debridement of sacral wound. Abscess drained 09/25 Off vasopressors. More alert. No distress. Worsening thrombocytopenia. Vanc DC'd 09/29  Dr. [name] (I.D) excused from the case by patient's husband. 10/02 MRI -multiple infarcts 10/03 tracheal bleeding- transfused platelets 10/03 hospitalist service excused from the case by patient's husband 10/03 Echocardiogram ejection fraction was 55-60%, pulmonary systolic pressure was 39 mmHg 40/9810/08 restart TF's at lower rate, attempt reg HD  10/12 Transferred to med-surg floor. Remains on PCCM service 10/14 SLP eval: pt unable to tolerate PMV adequately 10/17-will re-attempt PM valve-discussed with Speech therapist 10/18 passed swallow eval-start pureed thick foods no thin liquids-continue NG feeds 10/19 transferred to step down for sepsis/aspiration pneumonia 10/19 cxr shows RLL opacity 10/21 sacral decub debride at bedside by surgery 10/22 started back on vasopressors while on HD 10/27 CT with osteo, R hip fx, unable to identify tip of dubhoff tube - sent for fluoro study 10/28 Ortho consultation: I do not feel that she is a surgical candidate. Therefore,  I feel that it would best to manage this fracture nonsurgically and allow it to heal by itself over time, which it should.  10/28 Gen Surg  consultation: Due to the lack of free air or free flow of contrast and the peritoneum there is no indication for any surgical intervention on this. Would recommend pulling back the feeding tube 1-2 cm. Would recheck an abdominal film to confirm no pneumoperitoneum in the morning. Absence of any changes okay to continue using Dobbhoff for feeding and medications. 10/28 gastrograffin study: The study confirms that the feeding tube pes perforated through the duodenum and the tip is within a cavity that fills with injected contrast. 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 Pneumoniae, P. Mirabilis, VRE Wound 9/26 >> No growth Blood 9/23 >> NEG CDiff 9/27>>neg Stool Cx 10/15>> negative Cdiff 10/25>>neg Trach Aspirate 10/28>> light growth pseudomonas Blood 10/28 >> 1/2 GPC >>  Sputum cultures obtained 09/22/15 due to mucus plugging>>Pseudomonas BONE TISSUE Cx 11/3>>E.coli (ESBL), k. Pneumonia (daptomycin and septra)  ANTIMICROBIALS:  Aztreonam 9/23 >> 9/24 Vanc 9/23 >> 9/25 Vanc 9/26>>9/27 Daptomycin 9/27>> 10/12, 10/28>>  Meropenem 9/23 >> 10/14, 10/28 >> 10/31 Septra 11/8>>   VITAL SIGNS: Temp:  [97.6 F (36.4 C)-98.7 F  (37.1 C)] 97.8 F (36.6 C) (11/18 0000) Pulse Rate:  [97-108] 104 (11/18 0600) Resp:  [7-29] 27 (11/18 0600) BP: (116-136)/(45-94) 130/75 mmHg (11/18 0600) SpO2:  [91 %-100 %] 98 % (11/18 0600) FiO2 (%):  [35 %-40 %] 40 % (11/17 2140) HEMODYNAMICS:   VENTILATOR SETTINGS: Vent Mode:  [-]  FiO2 (%):  [35 %-40 %] 40 % INTAKE / OUTPUT:  Intake/Output Summary (Last 24 hours) at 10/07/15 0859 Last data filed at 10/07/15 0600  Gross per 24 hour  Intake   1940 ml  Output      0 ml  Net   1940 ml    PHYSICAL EXAMINATION: Physical Examination:   VS: BP 130/75 mmHg  Pulse 104  Temp(Src) 97.8 F (36.6 C) (Oral)  Resp 27  Ht  (1.753 m)  Wt 213 lb 13.5 oz (97 kg)  BMI 31.57 kg/m2  SpO2 98%  LMP  (LMP Unknown)  General Appearance: No resp distress, coarse upper airway sounds, off vent, on TCT Neuro: profoundly diffusely weak, alert today, nodding head HEENT: cushingoid facies, PERRLA, EOM intact Neck: trach site clean Pulmonary: clear anteriorly Cardiovascular: reg, + syst murmur Abdomen: soft, NT, +BS Extremities: warm, R>L UE edema Examination of the sacral decubitus ulcer- showed a very deep ulcer approximately 7-8 cm across.   stage IV ulcer.   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 BMET intermittently Monitor I/Os Correct electrolytes as  indicated Intermittent HD per Renal Service (MWF-dialysis today) CXR today,   GASTROINTESTINAL A:  LGIB, resolved Hx of C. Diff Stool incontinence  P:  Cont PPI Resume TFs 10/30 goal now at 50 cc/hr  HEMATOLOGIC A:  Acute on chronic anemia S/p Thrombocytopenia - HIT panel negative 9/25, resolved P: DVT px: SCDs + SQ heparin Monitor CBC intermittently Transfuse per usual ICU guidelines  INFECTIOUS A:  Recurrent Severe sepsis, due to sacral decub, currently stable.  Sacral decubitus ulcer with abscess - on Septra and daptomycin ID actively following along, appreciate recs - currently on daptomycin & septra P:  Monitor temp, WBC count Micro and abx as above -follow ID recs  ENDOCRINE A:  -DM 2, controlled. The patient developed some hypoglycemia when the tube feed was stopped and therefore she was started on D10 because she did not take in an adequate amount of calories to maintain her blood glucose levels in the normal range.  -Chronic steroid therapy, for a history of of adrenal insufficiency. P:  Monitor CBGs q 8 hrs - resume SSI if > 180 Cont hydrocortisone @ 10 mg BID Continue  tube feeds. We'll need to monitor blood glucose levels.  MSK A: Right hip fx - small intertrochanteric R fx - pain management. Ortho evaluated and felt not a surgical candidate at this time.   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  The Patient requires high complexity decision making for assessment and support, frequent evaluation and titration of therapies,  application of advanced monitoring technologies and extensive interpretation of multiple databases. Critical Care Time devoted to patient care services described in this note is 35 minutes.   Overall, patient is critically ill, prognosis is guarded.    Lucie Leather, M.D.  [facility]  Medical Director [facility] Medical Director [facility] Cardio-Pulmonary Department

## 2015-10-07 NOTE — Progress Notes (Signed)
Nutrition Follow-up  DOCUMENTATION CODES:   Severe malnutrition in context of acute illness/injury  INTERVENTION:   EN: continue current TF regimen per MD orders Coordination of Care: ultimately, if aggressive care is continued, pt needs a permanent feeding tube (which per previous discussions with surgeons, pt will NOT be able to have placed here). Dobhoff tube as been in place since 9/26. Regardless of whether or not diet able to be advanced, pt has demonstrated inability to meet nutritional needs orally as previously documented and will require feeding tube for nutrition support. A feeding tube will not prevent pt from taking oral intake, if pt able and willing, but will allow nutritional intake to be optimized via alternative means. At this time, will continue to follow pt but will not document on pt daily as no further recommendations at this time. RD will continue to participate in interdisciplinary ICU rounds and provide recommendations as needed  NUTRITION DIAGNOSIS:   Inadequate oral intake related to wound healing, acute illness, chronic illness as evidenced by NPO status, estimated needs. Being addressed via TF  GOAL:   Patient will meet greater than or equal to 90% of their needs  MONITOR:    (Energy Intake, Anthropometrics, Digestive System, Electrolyte/Renal Profile, Glucose Profile)  REASON FOR ASSESSMENT:   Consult Enteral/tube feeding initiation and management  ASSESSMENT:    Pt place back on vent support via trach this AM, HD as scheduled, wound care as needed, working with therapy as able; per [employer] RN, pt now with continuous liquid stool  Diet Order:   no order, SLP following and allowing pleasure feeds at present  EN: tolerating Vital 1.2 AF at rate of 50 ml/hr, goal is 65 ml/hr; Prostat 6 times daily  Skin:   (stage IV sacrum, stage III hip, unstageable foot; rectal ulcer from fleixseal, ulcer in nose from dobhoff)   Digestive System: pt has been having  liquid stool since admission but per RN, pt now having continuous liquid stool, noted plan to check Cdiff  Electrolyte and Renal Profile:  Recent Labs Lab 09/30/15 1828 10/03/15 0412 10/04/15 0900  BUN 71* 82* 62*  CREATININE 1.36* 1.39* 1.08*  NA 143 144 140  K 4.2 4.3 3.9  MG  --  1.7 1.8  PHOS 3.7 4.2 3.4   Glucose Profile:  Recent Labs  10/07/15 0017 10/07/15 0419 10/07/15 0739  GLUCAP 120* 137* 143*   Meds: reviewed  Height:   Ht Readings from Last 1 Encounters:  2015-03-23 5\' 9"  (1.753 m)    Weight:   Wt Readings from Last 1 Encounters:  10/06/15 213 lb 13.5 oz (97 kg)    BMI:  Body mass index is 31.57 kg/(m^2).  Estimated Nutritional Needs:   Kcal:  2175-2535 kcals (BEE 1509, 1.2 AF, 1.2-1.4 IF) or 2490-2905 kcals (30-35 kcals/kg)   Protein:  125-166 g (1.5-2.0 g/kg) but likely closer to 166-208 g (2.0-2.5 g/kg)   Fluid:  1000 mL plus UOP  EDUCATION NEEDS:   No education needs identified at this time  LOW Care Level  Romelle Starcherate Sael Furches MS, RD, LDN [phone] Pager

## 2015-10-07 NOTE — Care Management (Signed)
Patient is back on the ventilator.

## 2015-10-08 LAB — GLUCOSE, CAPILLARY
GLUCOSE-CAPILLARY: 138 mg/dL — AB (ref 65–99)
GLUCOSE-CAPILLARY: 102 mg/dL — AB (ref 65–99)
Glucose-Capillary: 110 mg/dL — ABNORMAL HIGH (ref 65–99)
Glucose-Capillary: 123 mg/dL — ABNORMAL HIGH (ref 65–99)
Glucose-Capillary: 124 mg/dL — ABNORMAL HIGH (ref 65–99)
Glucose-Capillary: 132 mg/dL — ABNORMAL HIGH (ref 65–99)

## 2015-10-08 NOTE — Progress Notes (Signed)
Physical Therapy Treatment Patient Details Name: Lisette GrinderHedda McMillian Monroe MRN: [id number] DOB: 04/15/1966 Today's Date: 10/08/2015    History of Present Illness Pt is a [age] F who has been in medical facilities (hosp, LTAC, rehab) for 2 yrs following gastric bypass surgery with multiple complications. Now with chronic trach, ESRD, profound debilitation, severe sacral pressure ulcer (stage IV). Was seemingly making progress and transferred to rehab facility approx one week prior to this admission. [facility]/23 with AMS and hypotension. Admitted with sepsis related to infected sacral ulcer.  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  Patient transferred to CCU to med-surg floor during hospitalization, but experienced profound interdialytic hypotension requiring return transfer to CCU for use of pressors (now off); remains in CCU.  Noted with R hip intertrochanteric fracture, recommended for conservative management per orthopedics.  Hospital course continues to be complicated with patient requiring return to vent 11/5 for respiratory decline with use of pressors (now off); extubated to trach collar 11/10; limited ability/willingness to follow commands or actively participate in therapeutic interventions.  Per care team, physicians recommending hospice care/comfort measures at this time; husband considering goals of care/POC pending discussion with patient and other family members, but wishes continued medical care at this time. Will carefully monitor medical plan and POC to evaluate appropriateness of continued PT throughout remaining hospitalization.    PT Comments    Pt initially did not appear willing to participate with PT, but with encouragement she is able to show some minimal AAROM effort with L LE exercises.  Issued HEP and reviewed  with pt and husband, he is able to show good understanding and is very encouraging of pt to participate.  Pt did not show any ability to wiggle toes, pump ankles or engage quads/glutes but did show some AAROM with hip Adduction (very minimal Abd?) and with leg extension presses.    Follow Up Recommendations  LTACH;SNF     Equipment Recommendations       Recommendations for Other Services       Precautions / Restrictions Precautions Precautions: Fall Restrictions RLE Weight Bearing: Non weight bearing    Mobility  Bed Mobility               General bed mobility comments: Deferred/contraindicated  Transfers                    Ambulation/Gait                 Stairs            Wheelchair Mobility    Modified Rankin (Stroke Patients Only)       Balance                                    Cognition Arousal/Alertness: Awake/alert Behavior During Therapy: Flat affect (only minimally willing to look at PT)                        Exercises Total Joint Exercises Ankle Circles/Pumps: PROM;Both;Supine;20 reps Hip ABduction/ADduction: Left;PROM;20 reps (pt able to give some minimal effort with ) General Exercises - Lower Extremity Quad Sets:  (tried to encourage quad sets with lifting knee, pt unable) Gluteal Sets: 15 reps (encouraged pt to begin regular glute  set) Heel Slides: Left;15 reps;PROM    General Comments        Pertinent Vitals/Pain Pain Assessment:  (unable to rate, does not seem to indicate much pain with HEP)    Home Living                      Prior Function            PT Goals (current goals can now be found in the care plan section)      Frequency  Min 2X/week    PT Plan Current plan remains appropriate    Co-evaluation             End of Session   Activity Tolerance:  (pt marginally able to participate with PT) Patient left: in bed;with family/visitor present      Time: 1431-1459 PT Time Calculation (min) (ACUTE ONLY): 28 min  Charges:  $Therapeutic Exercise: 23-37 mins                    G Codes:     Loran Senters, PT, DPT [phone]  Malachi Pro 10/08/2015, 4:13 PM

## 2015-10-08 NOTE — Progress Notes (Signed)
   10/08/15 1300  Clinical Encounter Type  Visited With Patient and family together  Visit Type Follow-up  Referral From Nurse  Consult/Referral To Chaplain  Spiritual Encounters  Spiritual Needs Other (Comment) (Paged by nurse to speak to husband)  Paged by nurse to speak with husband. He advised that "the other chaplain know his situation". I told him that they would be in on Monday. He advised that there was nothing I could do for him at this time. Konrad PentaChap Rick Faline Langer  [phone]

## 2015-10-08 NOTE — Progress Notes (Signed)
Subjective:  Patient seen at bedside.   Back on the vent. Remains critically ill. Had HD yesterday.     Objective:  Vital signs in last 24 hours:  Temp:  [97.6 F (36.4 C)-99.4 F (37.4 C)] 97.6 F (36.4 C) (11/19 0500) Pulse Rate:  [98-123] 103 (11/19 0800) Resp:  [0-29] 4 (11/19 0800) BP: (74-141)/(28-107) 113/42 mmHg (11/19 0800) SpO2:  [86 %-100 %] 100 % (11/19 0831) FiO2 (%):  [30 %] 30 % (11/19 0831)  Weight change:  Filed Weights   10/04/15 1230 10/05/15 0400 10/06/15 0429  Weight: 97 kg (213 lb 13.5 oz) 100 kg (220 lb 7.4 oz) 97 kg (213 lb 13.5 oz)    Intake/Output: I/O last 3 completed shifts: In: 2254.1 [I.V.:284.1; NG/GT:1970] Out: -    Intake/Output this shift:     Physical Exam: General: No acute distress, chronically ill appearing  Head: NG in place, OM moist  Eyes: anicteric  Neck: Tracheostomy in place  Lungs:  Scattered rhonchi, normal effort  Heart: S1S2 +tachycardia  Abdomen:  Soft, nontender, BS present  Extremities: + dependent edema, anasarca  Neurologic: Lethargic but arousable  Access:  R IJ permcath.       Basic Metabolic Panel:  Recent Labs Lab 10/03/15 0412 10/04/15 0900 10/07/15 1600  NA 144 140 141  K 4.3 3.9 4.3  CL 103 102 100*  CO2 34* 33* 34*  GLUCOSE 149* 107* 146*  BUN 82* 62* 64*  CREATININE 1.39* 1.08* 0.98  CALCIUM 8.3* 8.2* 8.4*  MG 1.7 1.8  --   PHOS 4.2 3.4 3.4    Liver Function Tests:  Recent Labs Lab 10/07/15 1600  ALBUMIN 2.6*   No results for input(s): LIPASE, AMYLASE in the last 168 hours. No results for input(s): AMMONIA in the last 168 hours.  CBC:  Recent Labs Lab 10/03/15 0412 10/04/15 0900 10/07/15 1600  WBC 12.2* 12.6* 11.7*  HGB 8.5* 9.1* 8.8*  HCT 27.4* 29.6* 28.5*  MCV 103.4* 102.7* 103.4*  PLT 183 205 201    Cardiac Enzymes: No results for input(s): CKTOTAL, CKMB, CKMBINDEX, TROPONINI in the last 168 hours.  BNP: Invalid input(s): POCBNP  CBG:  Recent Labs Lab  10/07/15 1611 10/07/15 1949 10/07/15 2337 10/08/15 0415 10/08/15 0704  GLUCAP 125* 188* 158* 138* 123*    Microbiology: Results for orders placed or performed during the hospital encounter of 08/18/2015  Blood Culture (routine x 2)     Status: None   Collection Time: 08/09/2015  8:51 AM  Result Value Ref Range Status   Specimen Description BLOOD [name]  Final   Special Requests BOTTLES DRAWN AEROBIC AND ANAEROBIC  3CC  Final   Culture NO GROWTH 5 DAYS  Final   Report Status 08/17/2015 FINAL  Final  Blood Culture (routine x 2)     Status: None   Collection Time: 07/23/2015  9:20 AM  Result Value Ref Range Status   Specimen Description BLOOD LEFT ARM  Final   Special Requests BOTTLES DRAWN AEROBIC AND ANAEROBIC  1CC  Final   Culture NO GROWTH 5 DAYS  Final   Report Status 08/17/2015 FINAL  Final  Wound culture     Status: None   Collection Time: 08/07/2015  9:20 AM  Result Value Ref Range Status   Specimen Description DECUBITIS  Final   Special Requests Normal  Final   Gram Stain   Final    FEW WBC SEEN MANY GRAM NEGATIVE RODS RARE GRAM POSITIVE COCCI  Culture   Final    HEAVY GROWTH ESCHERICHIA COLI MODERATE GROWTH ENTEROBACTER AEROGENES PROTEUS MIRABILIS HEAVY GROWTH ENTEROCOCCUS SPECIES VRE HAVE INTRINSIC RESISTANCE TO MOST COMMONLY USED ANTIBIOTICS AND THE ABILITY TO ACQUIRE RESISTANCE TO MOST AVAILABLE ANTIBIOTICS.    Report Status 08/16/2015 FINAL  Final   Organism ID, Bacteria ESCHERICHIA COLI  Final   Organism ID, Bacteria ENTEROBACTER AEROGENES  Final   Organism ID, Bacteria PROTEUS MIRABILIS  Final   Organism ID, Bacteria ENTEROCOCCUS SPECIES  Final      Susceptibility   Enterobacter aerogenes - MIC*    CEFTAZIDIME <=1 SENSITIVE Sensitive     CEFAZOLIN >=64 RESISTANT Resistant     CEFTRIAXONE <=1 SENSITIVE Sensitive     CIPROFLOXACIN <=0.25 SENSITIVE Sensitive     GENTAMICIN <=1 SENSITIVE Sensitive     IMIPENEM 1 SENSITIVE Sensitive     TRIMETH/SULFA <=20  SENSITIVE Sensitive     * MODERATE GROWTH ENTEROBACTER AEROGENES   Escherichia coli - MIC*    AMPICILLIN <=2 SENSITIVE Sensitive     CEFTAZIDIME <=1 SENSITIVE Sensitive     CEFAZOLIN <=4 SENSITIVE Sensitive     CEFTRIAXONE <=1 SENSITIVE Sensitive     CIPROFLOXACIN <=0.25 SENSITIVE Sensitive     GENTAMICIN <=1 SENSITIVE Sensitive     IMIPENEM <=0.25 SENSITIVE Sensitive     TRIMETH/SULFA <=20 SENSITIVE Sensitive     * HEAVY GROWTH ESCHERICHIA COLI   Proteus mirabilis - MIC*    AMPICILLIN >=32 RESISTANT Resistant     CEFTAZIDIME <=1 SENSITIVE Sensitive     CEFAZOLIN 8 SENSITIVE Sensitive     CEFTRIAXONE <=1 SENSITIVE Sensitive     CIPROFLOXACIN <=0.25 SENSITIVE Sensitive     GENTAMICIN <=1 SENSITIVE Sensitive     IMIPENEM 1 SENSITIVE Sensitive     TRIMETH/SULFA <=20 SENSITIVE Sensitive     * PROTEUS MIRABILIS   Enterococcus species - MIC*    AMPICILLIN >=32 RESISTANT Resistant     VANCOMYCIN >=32 RESISTANT Resistant     GENTAMICIN SYNERGY SENSITIVE Sensitive     TETRACYCLINE Value in next row Resistant      RESISTANT>=16    * HEAVY GROWTH ENTEROCOCCUS SPECIES  MRSA PCR Screening     Status: None   Collection Time: 08/11/2015  2:38 PM  Result Value Ref Range Status   MRSA by PCR NEGATIVE NEGATIVE Final    Comment:        The GeneXpert MRSA Assay (FDA approved for NASAL specimens only), is one component of a comprehensive MRSA colonization surveillance program. It is not intended to diagnose MRSA infection nor to guide or monitor treatment for MRSA infections.   Blood culture (single)     Status: None   Collection Time: 07/21/2015  3:36 PM  Result Value Ref Range Status   Specimen Description BLOOD RIGHT ASSIST CONTROL  Final   Special Requests BOTTLES DRAWN AEROBIC AND ANAEROBIC  Final   Culture NO GROWTH 5 DAYS  Final   Report Status 08/17/2015 FINAL  Final  Wound culture     Status: None   Collection Time: 08/13/15 12:37 PM  Result Value Ref Range Status   Specimen  Description WOUND  Final   Special Requests Normal  Final   Gram Stain   Final    FEW WBC SEEN TOO NUMEROUS TO COUNT GRAM NEGATIVE RODS FEW GRAM POSITIVE COCCI    Culture   Final    HEAVY GROWTH ESCHERICHIA COLI MODERATE GROWTH PROTEUS MIRABILIS LIGHT GROWTH KLEBSIELLA PNEUMONIAE MODERATE GROWTH ENTEROCOCCUS GALLINARUM  CRITICAL RESULT CALLED TO, READ BACK BY AND VERIFIED WITH: [facility] BORBA AT 1042 08/16/15 DV    Report Status 08/17/2015 FINAL  Final   Organism ID, Bacteria ESCHERICHIA COLI  Final   Organism ID, Bacteria PROTEUS MIRABILIS  Final   Organism ID, Bacteria KLEBSIELLA PNEUMONIAE  Final   Organism ID, Bacteria ENTEROCOCCUS GALLINARUM  Final      Susceptibility   Escherichia coli - MIC*    AMPICILLIN >=32 RESISTANT Resistant     CEFTAZIDIME 4 RESISTANT Resistant     CEFAZOLIN >=64 RESISTANT Resistant     CEFTRIAXONE 16 RESISTANT Resistant     GENTAMICIN 2 SENSITIVE Sensitive     IMIPENEM >=16 RESISTANT Resistant     TRIMETH/SULFA <=20 SENSITIVE Sensitive     Extended ESBL POSITIVE Resistant     PIP/TAZO Value in next row Resistant      RESISTANT>=128    CIPROFLOXACIN Value in next row Sensitive      SENSITIVE<=0.25    * HEAVY GROWTH ESCHERICHIA COLI   Klebsiella pneumoniae - MIC*    AMPICILLIN Value in next row Resistant      SENSITIVE<=0.25    CEFTAZIDIME Value in next row Resistant      SENSITIVE<=0.25    CEFAZOLIN Value in next row Resistant      SENSITIVE<=0.25    CEFTRIAXONE Value in next row Resistant      SENSITIVE<=0.25    CIPROFLOXACIN Value in next row Resistant      SENSITIVE<=0.25    GENTAMICIN Value in next row Sensitive      SENSITIVE<=0.25    IMIPENEM Value in next row Resistant      SENSITIVE<=0.25    TRIMETH/SULFA Value in next row Resistant      SENSITIVE<=0.25    PIP/TAZO Value in next row Resistant      RESISTANT>=128    * LIGHT GROWTH KLEBSIELLA PNEUMONIAE   Proteus mirabilis - MIC*    AMPICILLIN Value in next row Resistant       RESISTANT>=128    CEFTAZIDIME Value in next row Sensitive      RESISTANT>=128    CEFAZOLIN Value in next row Sensitive      RESISTANT>=128    CEFTRIAXONE Value in next row Sensitive      RESISTANT>=128    CIPROFLOXACIN Value in next row Sensitive      RESISTANT>=128    GENTAMICIN Value in next row Sensitive      RESISTANT>=128    IMIPENEM Value in next row Sensitive      RESISTANT>=128    TRIMETH/SULFA Value in next row Sensitive      RESISTANT>=128    PIP/TAZO Value in next row Sensitive      SENSITIVE<=4    * MODERATE GROWTH PROTEUS MIRABILIS   Enterococcus gallinarum - MIC*    AMPICILLIN Value in next row Resistant      SENSITIVE<=4    GENTAMICIN SYNERGY Value in next row Sensitive      SENSITIVE<=4    CIPROFLOXACIN Value in next row Resistant      RESISTANT>=8    TETRACYCLINE Value in next row Resistant      RESISTANT>=16    * MODERATE GROWTH ENTEROCOCCUS GALLINARUM  Wound culture     Status: None   Collection Time: 08/15/15  2:53 PM  Result Value Ref Range Status   Specimen Description WOUND  Final   Special Requests NONE  Final   Gram Stain FEW WBC SEEN NO ORGANISMS SEEN   Final  Culture NO GROWTH 3 DAYS  Final   Report Status 08/18/2015 FINAL  Final  C difficile quick scan w PCR reflex     Status: None   Collection Time: 08/17/15 11:34 AM  Result Value Ref Range Status   C Diff antigen NEGATIVE NEGATIVE Final   C Diff toxin NEGATIVE NEGATIVE Final   C Diff interpretation Negative for C. difficile  Final  Stool culture     Status: None   Collection Time: 09/03/15  3:51 PM  Result Value Ref Range Status   Specimen Description STOOL  Final   Special Requests Immunocompromised  Final   Culture   Final    NO SALMONELLA OR SHIGELLA ISOLATED No Pathogenic E. coli detected NO CAMPYLOBACTER DETECTED    Report Status 09/07/2015 FINAL  Final  C difficile quick scan w PCR reflex     Status: None   Collection Time: 09/13/15 12:51 AM  Result Value Ref Range Status    C Diff antigen NEGATIVE NEGATIVE Final   C Diff toxin NEGATIVE NEGATIVE Final   C Diff interpretation Negative for C. difficile  Final  Culture, blood (routine x 2)     Status: None   Collection Time: 09/16/15 11:24 AM  Result Value Ref Range Status   Specimen Description BLOOD LEFT HAND  Final   Special Requests BOTTLES DRAWN AEROBIC AND ANAEROBIC  1CC  Final   Culture NO GROWTH 5 DAYS  Final   Report Status 09/21/2015 FINAL  Final  Culture, blood (routine x 2)     Status: None   Collection Time: 09/16/15 12:23 PM  Result Value Ref Range Status   Specimen Description BLOOD RIGHT HAND  Final   Special Requests BOTTLES DRAWN AEROBIC AND ANAEROBIC  1CC  Final   Culture  Setup Time   Final    GRAM POSITIVE COCCI AEROBIC BOTTLE ONLY CRITICAL RESULT CALLED TO, READ BACK BY AND VERIFIED WITH: TESS THOMAS,RN 09/17/2015 0631 BY JRS.    Culture   Final    ENTEROCOCCUS FAECALIS AEROBIC BOTTLE ONLY VRE HAVE INTRINSIC RESISTANCE TO MOST COMMONLY USED ANTIBIOTICS AND THE ABILITY TO ACQUIRE RESISTANCE TO MOST AVAILABLE ANTIBIOTICS. CRITICAL RESULT CALLED TO, READ BACK BY AND VERIFIED WITH: CHERYL SMITH AT 1610 09/19/15 DV    Report Status 09/21/2015 FINAL  Final   Organism ID, Bacteria ENTEROCOCCUS FAECALIS  Final      Susceptibility   Enterococcus faecalis - MIC*    AMPICILLIN <=2 SENSITIVE Sensitive     LINEZOLID 2 SENSITIVE Sensitive     CIPROFLOXACIN Value in next row Resistant      RESISTANT>=8    TETRACYCLINE Value in next row Resistant      RESISTANT>=16    VANCOMYCIN Value in next row Resistant      RESISTANT>=32    GENTAMICIN SYNERGY Value in next row Resistant      RESISTANT>=32    * ENTEROCOCCUS FAECALIS  Culture, respiratory (NON-Expectorated)     Status: None   Collection Time: 09/16/15  3:50 PM  Result Value Ref Range Status   Specimen Description TRACHEAL ASPIRATE  Final   Special Requests Immunocompromised  Final   Gram Stain   Final    FEW WBC SEEN GOOD SPECIMEN  - 80-90% WBCS RARE GRAM NEGATIVE RODS    Culture LIGHT GROWTH PSEUDOMONAS AERUGINOSA  Final   Report Status 09/19/2015 FINAL  Final   Organism ID, Bacteria PSEUDOMONAS AERUGINOSA  Final      Susceptibility   Pseudomonas aeruginosa -  MIC*    CEFTAZIDIME 8 SENSITIVE Sensitive     CIPROFLOXACIN 2 INTERMEDIATE Intermediate     GENTAMICIN >=16 RESISTANT Resistant     IMIPENEM >=16 RESISTANT Resistant     PIP/TAZO Value in next row Sensitive      SENSITIVE32    CEFEPIME Value in next row Sensitive      SENSITIVE8    LEVOFLOXACIN Value in next row Resistant      RESISTANT>=8    * LIGHT GROWTH PSEUDOMONAS AERUGINOSA  Culture, expectorated sputum-assessment     Status: None   Collection Time: 09/22/15  2:09 PM  Result Value Ref Range Status   Specimen Description ENDOTRACHEAL  Final   Special Requests Normal  Final   Sputum evaluation THIS SPECIMEN IS ACCEPTABLE FOR SPUTUM CULTURE  Final   Report Status 09/24/2015 FINAL  Final  Culture, respiratory (NON-Expectorated)     Status: None   Collection Time: 09/22/15  2:09 PM  Result Value Ref Range Status   Specimen Description ENDOTRACHEAL  Final   Special Requests Normal Reflexed from [id number]  Final   Gram Stain   Final    FEW WBC SEEN FEW GRAM NEGATIVE RODS POOR SPECIMEN - LESS THAN 70% WBCS    Culture   Final    MODERATE GROWTH PSEUDOMONAS AERUGINOSA LIGHT GROWTH KLEBSIELLA PNEUMONIAE REFER TO SENSITIVITIES FROM PREVIOUS CULTURE FOR ORG 2    Report Status 10/01/2015 FINAL  Final   Organism ID, Bacteria PSEUDOMONAS AERUGINOSA  Final   Organism ID, Bacteria KLEBSIELLA PNEUMONIAE  Final      Susceptibility   Pseudomonas aeruginosa - MIC*    CEFTAZIDIME 4 SENSITIVE Sensitive     CIPROFLOXACIN 2 INTERMEDIATE Intermediate     GENTAMICIN >=16 RESISTANT Resistant     IMIPENEM >=16 RESISTANT Resistant     PIP/TAZO Value in next row Sensitive      SENSITIVE16    * MODERATE GROWTH PSEUDOMONAS AERUGINOSA  Tissue culture     Status: None    Collection Time: 09/24/15  6:44 AM  Result Value Ref Range Status   Specimen Description BONE  Final   Special Requests Normal  Final   Gram Stain MODERATE WBC SEEN FEW GRAM NEGATIVE RODS   Final   Culture   Final    MODERATE GROWTH ESCHERICHIA COLI MODERATE GROWTH KLEBSIELLA PNEUMONIAE ESBL-EXTENDED SPECTRUM BETA LACTAMASE-THE ORGANISM IS RESISTANT TO PENICILLINS, CEPHALOSPORINS AND AZTREONAM ACCORDING TO CLSI M100-S15 VOL.25 N01 JAN 2005. ORGANISM 1 This organism isolate is resistant to one or more antiotic agents in three or more antimicrobial categories.  Suggest Infectious Disease consult.   ORGANISM 2 CRITICAL RESULT CALLED TO, READ BACK BY AND VERIFIED WITH: RN Mardene Celeste LINDSAY 09/27/15 1005AM    Report Status 09/28/2015 FINAL  Final   Organism ID, Bacteria ESCHERICHIA COLI  Final   Organism ID, Bacteria KLEBSIELLA PNEUMONIAE  Final      Susceptibility   Escherichia coli - MIC*    AMPICILLIN >=32 RESISTANT Resistant     CEFTAZIDIME 4 RESISTANT Resistant     CEFAZOLIN >=64 RESISTANT Resistant     CEFTRIAXONE 8 RESISTANT Resistant     GENTAMICIN <=1 SENSITIVE Sensitive     IMIPENEM 8 RESISTANT Resistant     TRIMETH/SULFA <=20 SENSITIVE Sensitive     Extended ESBL POSITIVE Resistant     PIP/TAZO Value in next row Resistant      RESISTANT>=128    * MODERATE GROWTH ESCHERICHIA COLI   Klebsiella pneumoniae - MIC*    AMPICILLIN Value  in next row Resistant      RESISTANT>=128    CEFTAZIDIME Value in next row Resistant      RESISTANT>=128    CEFAZOLIN Value in next row Resistant      RESISTANT>=128    CEFTRIAXONE Value in next row Resistant      RESISTANT>=128    CIPROFLOXACIN Value in next row Resistant      RESISTANT>=128    GENTAMICIN Value in next row Resistant      RESISTANT>=128    IMIPENEM Value in next row Resistant      RESISTANT>=128    TRIMETH/SULFA Value in next row Sensitive      RESISTANT>=128    PIP/TAZO Value in next row Resistant      RESISTANT>=128     * MODERATE GROWTH KLEBSIELLA PNEUMONIAE  Anaerobic culture     Status: None   Collection Time: 09/24/15  3:55 PM  Result Value Ref Range Status   Specimen Description BONE  Final   Special Requests Normal  Final   Culture NO ANAEROBES ISOLATED  Final   Report Status 09/29/2015 FINAL  Final  C difficile quick scan w PCR reflex     Status: None   Collection Time: 10/07/15  2:02 PM  Result Value Ref Range Status   C Diff antigen NEGATIVE NEGATIVE Final   C Diff toxin NEGATIVE NEGATIVE Final   C Diff interpretation Negative for C. difficile  Final    Coagulation Studies: No results for input(s): LABPROT, INR in the last 72 hours.  Urinalysis: No results for input(s): COLORURINE, LABSPEC, PHURINE, GLUCOSEU, HGBUR, BILIRUBINUR, KETONESUR, PROTEINUR, UROBILINOGEN, NITRITE, LEUKOCYTESUR in the last 72 hours.  Invalid input(s): APPERANCEUR    Imaging: No results found.   Medications:   . feeding supplement (VITAL AF 1.2 CAL) 1,000 mL (10/07/15 2332)  . norepinephrine Stopped (10/08/15 0530)   . amantadine  100 mg Per Tube BID  . antiseptic oral rinse  7 mL Mouth Rinse q12n4p  . aspirin  81 mg Oral Daily  . chlorhexidine  15 mL Mouth Rinse BID  . epoetin (EPOGEN/PROCRIT) injection  10,000 Units Intravenous Q M,W,F-HD  . feeding supplement (PRO-STAT SUGAR FREE 64)  30 mL Per Tube 6 times per day  . folic acid  1 mg Oral Daily  . free water  20 mL Per Tube 6 times per day  . heparin subcutaneous  5,000 Units Subcutaneous Q12H  . hydrocortisone  10 mg Per Tube BID  . insulin aspart  0-9 Units Subcutaneous 6 times per day  . ipratropium-albuterol  3 mL Nebulization QID  . lidocaine  1 patch Transdermal Q24H  . loperamide  2 mg Oral Daily  . megestrol  400 mg Oral BID  . midodrine  5 mg Per Tube TID WC  . multivitamin  5 mL Oral Daily  . pantoprazole sodium  40 mg Per Tube Daily  . sodium chloride  10-40 mL Intracatheter Q12H  . sodium hypochlorite   Irrigation BID  .  sulfamethoxazole-trimethoprim  2.5 tablet Per Tube q1800  . thiamine IV  100 mg Intravenous Daily   sodium chloride, sodium chloride, acetaminophen (TYLENOL) oral liquid 160 mg/5 mL, HYDROmorphone (DILAUDID) injection, Menthol-Zinc Oxide, morphine injection, ondansetron (ZOFRAN) IV, oxyCODONE, sodium chloride  Assessment/ Plan:  49 y.o. 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 Admitted on 07/24/2015  1. End-stage renal disease on hemodialysis on HD MWF. The patient has been on dialysis since October of 2014. R IJ permcath.  Tolerated dialysis treatment with IV albumin for oncotic support.  - Pt had HD yesterday, no acute indication for HD today, will plan for HD again tomorrow.  2. Anemia of CKD: Hgb currently 8.8, continue epogne with HD.  3. Secondary hyperparathyroidism: PTH low at 55 -phos 3.4 yesterday and acceptable.   4. Sepsis: VRE in blood. Bone cultures E. Coli and klebsiella - Treatment as per ID and ICU team: daptomycin completed on 11/10 and now started on trimethoprim/sulfamethoxazole.   5. Generalized Dependent edema/Anasarca due to 3rd spacing - had significantly low BP yesterday, was placed back on vent, pressors were also restarted during HD, therefore UF target not reached.  6. Acute resp failure - continue vent support.  LOS: 57 Kaitlan Bin 11/19/20168:51 AM

## 2015-10-08 NOTE — Progress Notes (Signed)
Patient in no distress. Easy to arouse during respiratory visit. Suctioned trach for small amount of secretions x2. Patient tolerated well. BBS coarse and equal. Vitals stable with suctioning. Will continue to monitor. Vent on standby should respiratory efforts change.

## 2015-10-08 NOTE — Progress Notes (Signed)
[facility] [place] Critical Care Medicine Progess Note  Name: Courtney [name] MRN: [id number] DOB: 10-10-1966    ADMISSION DATE:  08/20/2015     INITIAL PRESENTATION:  49 AAF who has been in medical facilities (hosp, LTAC, rehab) for 2 yrs following gastric bypass surgery with multiple complications. Now with chronic trach, ESRD, profound debilitation, severe sacral pressure ulcer. Was seemingly making progress and transferred to rehab facility approx one week prior to this admission. She was sent to [facility] ED with AMS and hypotension. Working dx of severe sepsis/septic shock due to infected sacral pressure ulcer. Since admission. Her course is been very complicated with numerous complications including septic shock and GI bleeding. Had to be placed on vent due to excessive secretions, now on TCT-still with some secretions   INTERVAL HISTORY:  The patient was placed back on the ventilator overnight due to difficulty breathing, she subsequently improved, then taken off the ventilator again. She appears to be awake and alert, however, she is nonverbal with the examiner.   [name] changed out due to cuff leak on  11/5 PM, #6 Shiley cuffed, patient refusing oral intake  The patient was started on Megace and thiamine, on 09/22/2015 per the husband's request.  11/7>>husband at bedside updated on current plan. Plan for HD today, cont with TF, cont with daptomycin, awaiting bone cx and s/s from trach aspirate. Pressure in pilot balloon of trach in the 50s, if cont to be this high, might need to upsize trach.    11/8> no acute events overnight, mild thin secretions noted, was on the vent at 35% FiO2 during the night. Creatinine slightly worse compared to yesterday; had dialysis yesterday, 1 L removed. Sacral Bone culture shows Klebsiella pneumonia and Escherichia coli  11/9> placed on ventilator rate last night, today tolerating dialysis, goal removal is 1 L, we'll attempt trach collar trials today,  patient more interactive, will open eyes and nod. Patient is currently on Septra and daptomycin.  11/10>tolerated about 8 hrs off TCT yesterday, placed back on vent overnight, somnolent this morning but more alert as the day progresses.  HD with 1 liter removed yesterday, BP stable, not requiring pressors. RT report inc secretion, will obtain CXR and labs today.  11/14 currently on TCT, complains of back pain   MAJOR EVENTS/TEST RESULTS: Admission 02/07/14-05/07/14 Admission 07/21/14-09/06/14 Discharged to [facility]. Pt had palliative consult at that time, were asked to sign off by husband.  09/23 CT head: NAD 09/23 EEG: no epileptiform activity 09/23 PRBCs for Hgb 6.4 09/24 bedside debridement of sacral wound. Abscess drained 09/25 Off vasopressors. More alert. No distress. Worsening thrombocytopenia. Vanc DC'd 09/29  Dr. Sampson Goon (I.D) excused from the case by patient's husband. 10/02 MRI -multiple infarcts 10/03 tracheal bleeding- transfused platelets 10/03 hospitalist service excused from the case by patient's husband 10/03 Echocardiogram ejection fraction was 55-60%, pulmonary systolic pressure was 39 mmHg 40/98 restart TF's at lower rate, attempt reg HD  10/12 Transferred to med-surg floor. Remains on PCCM service 10/14 SLP eval: pt unable to tolerate PMV adequately 10/17-will re-attempt PM valve-discussed with Speech therapist 10/18 passed swallow eval-start pureed thick foods no thin liquids-continue NG feeds 10/19 transferred to step down for sepsis/aspiration pneumonia 10/19 cxr shows RLL opacity 10/21 sacral decub debride at bedside by surgery 10/22 started back on vasopressors while on HD 10/27 CT with osteo, R hip fx, unable to identify tip of dubhoff tube - sent for fluoro study 10/28 Ortho consultation: I do not feel that she is a surgical  candidate. Therefore, I feel that it would best to manage this fracture nonsurgically and allow it to heal by itself over time, which it  should.  10/28 Gen Surg consultation: Due to the lack of free air or free flow of contrast and the peritoneum there is no indication for any surgical intervention on this. Would recommend pulling back the feeding tube 1-2 cm. Would recheck an abdominal film to confirm no pneumoperitoneum in the morning. Absence of any changes okay to continue using Dobbhoff for feeding and medications. 10/28 gastrograffin study: The study confirms that the feeding tube pes perforated through the duodenum and the tip is within a cavity that fills with injected contrast. 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 Pneumoniae, P. Mirabilis, VRE Wound 9/26 >> No growth Blood 9/23 >> NEG CDiff 9/27>>neg Stool Cx 10/15>> negative Cdiff 10/25>>neg Trach Aspirate 10/28>> light growth pseudomonas Blood 10/28 >> 1/2 GPC >>  Sputum cultures obtained 09/22/15 due to mucus plugging>>Pseudomonas BONE TISSUE Cx 11/3>>E.coli (ESBL), k. Pneumonia (daptomycin and septra)  ANTIMICROBIALS:  Aztreonam 9/23 >> 9/24 Vanc 9/23 >> 9/25 Vanc 9/26>>9/27 Daptomycin 9/27>> 10/12, 10/28>>  Meropenem 9/23 >> 10/14, 10/28 >> 10/31 Septra 11/8>>   VITAL SIGNS: Temp:   [97.6 F (36.4 C)-99.4 F (37.4 C)] 98.3 F (36.8 C) (11/19 0800) Pulse Rate:  [98-123] 109 (11/19 1300) Resp:  [3-24] 3 (11/19 1300) BP: (81-141)/(30-85) 106/74 mmHg (11/19 1300) SpO2:  [99 %-100 %] 100 % (11/19 1300) FiO2 (%):  [30 %-40 %] 40 % (11/19 1115) HEMODYNAMICS:   VENTILATOR SETTINGS: Vent Mode:  [-] PRVC FiO2 (%):  [30 %-40 %] 40 % Set Rate:  [15 bmp] 15 bmp Vt Set:  [500 mL] 500 mL PEEP:  [5 cmH20] 5 cmH20 INTAKE / OUTPUT:  Intake/Output Summary (Last 24 hours) at 10/08/15 1534 Last data filed at 10/08/15 1115  Gross per 24 hour  Intake 1421.61 ml  Output    100 ml  Net 1321.61 ml    PHYSICAL EXAMINATION: Physical Examination:   VS: BP 106/74 mmHg  Pulse 109  Temp(Src) 98.3 F (36.8 C) (Axillary)  Resp 3  Ht  (1.753 m)  Wt 213 lb 13.5 oz (97 kg)  BMI 31.57 kg/m2  SpO2 100%  LMP  (LMP Unknown)  General Appearance: No resp distress, coarse upper airway sounds, off vent, on TCT Neuro: profoundly diffusely weak, alert today, nodding head HEENT: cushingoid facies, PERRLA, EOM intact Neck: trach site clean Pulmonary: clear anteriorly Cardiovascular: reg, + syst murmur Abdomen: soft, NT, +BS Extremities: warm, R>L UE edema Examination of the sacral decubitus ulcer- showed a very deep ulcer approximately 7-8 cm across.   stage IV ulcer.   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 BMET intermittently Monitor I/Os Correct electrolytes as indicated Intermittent HD per Renal Service (MWF-dialysis today) CXR today,   GASTROINTESTINAL A:  LGIB, resolved Hx of C. Diff Stool incontinence  P:  Cont PPI Continue TFs   HEMATOLOGIC A:  Acute on chronic anemia S/p Thrombocytopenia - HIT panel negative 9/25, resolved P: DVT px: SCDs + SQ heparin Monitor CBC intermittently Transfuse per usual ICU guidelines  INFECTIOUS A:  Recurrent Severe sepsis, due to sacral decub, currently stable.  Sacral decubitus ulcer with abscess - on Septra and daptomycin ID actively following along, appreciate recs - currently on septra following bone biopsy. P:  Monitor temp, WBC count Micro and abx as above -follow ID recs  ENDOCRINE A:  -DM 2, controlled.  -Chronic steroid therapy, for a history of of adrenal insufficiency. P:  Monitor CBGs q 8 hrs - resume SSI if > 180 Cont hydrocortisone @ 10 mg BID Continue  tube feeds. Monitor blood glucose levels.  MSK A: Right hip fx - small intertrochanteric R fx - pain management. Ortho evaluated and felt not a surgical candidate at this time.   NEUROLOGIC-critical illness polyneuropathy A:  Acute encephalopathy, now with chronic metabolic encephalopathy. The patient appears to have waxing and waning mental status, she was evaluated by both the psychiatry and neurology services, and it was deemed that the patient lacks decisional capacity at this time. 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 I did explain that the patient's overall prognosis is poor and  chance for any sort of long-term recovery is extremely remote. I expressed my concern that the care we are providing is not going to get her better, and I do not believe that she is going to make any significant progress. I expressed the opinion that most people would not want to be kept alive in this condition. The patient's husband felt that her condition is special and unique and we should not treat her like other patients, that while most people would take a few days or weeks to recover, she would take many, many weeks to recover, but he did feel that in fact she was going to make a recovery. He also felt that when it was her time to go that God would take her and that he would not want to stop continuing her care until that time. He also expressed frustration and anger at what he termed as "everything that this hospital has done to me." He points  to multiple situations including her low blood pressure when she came in and the subsequent stroke that she suffered as reasons that "changed everything.", in regards to her medical status. He felt that before that she was doing much better and he wanted to show me pictures of her status, stating that  she was indeed much better before she was here. He points to various things such as Scripture, previous doctors telling him that his wife was going to do poorly, and Jesus dying on the cross as examples of why he believes that his wife is going to get better despite his being told otherwise by all the physicians on the medical team.  I expressed appreciation in hearing his point of view without condoning or agreeing with said point of view. However, it appears clear to me that he is not open to seeing things in any other way other than his own current point of view.   While I will continue to provide her care. I made it clear that I do not agree with his line of  reasoning, and I cannot condone what he has continued to do, nor do I believe that he is acting in her best interests.   Wells Guileseep Damoni Erker M.D.  Critical Care Attestation.  I have personally obtained a history, examined the patient, evaluated laboratory and imaging results, formulated the assessment and plan and placed orders. The Patient requires high complexity decision making for assessment and support, frequent evaluation and titration of therapies, application of advanced monitoring technologies and extensive interpretation of multiple databases. The patient has critical illness that could lead imminently to failure of 1 or more organ systems and requires the highest level of physician preparedness to intervene.  Critical Care Time devoted to patient care services described in this note is 35 minutes and is exclusive of time spent in procedures.

## 2015-10-09 ENCOUNTER — Inpatient Hospital Stay: Payer: 59

## 2015-10-09 LAB — CBC
HEMATOCRIT: 25.7 % — AB (ref 35.0–47.0)
Hemoglobin: 8 g/dL — ABNORMAL LOW (ref 12.0–16.0)
MCH: 31.8 pg (ref 26.0–34.0)
MCHC: 31 g/dL — AB (ref 32.0–36.0)
MCV: 102.7 fL — AB (ref 80.0–100.0)
Platelets: 191 10*3/uL (ref 150–440)
RBC: 2.5 MIL/uL — ABNORMAL LOW (ref 3.80–5.20)
RDW: 18.7 % — AB (ref 11.5–14.5)
WBC: 9.1 10*3/uL (ref 3.6–11.0)

## 2015-10-09 LAB — BASIC METABOLIC PANEL
Anion gap: 7 (ref 5–15)
BUN: 91 mg/dL — AB (ref 6–20)
CHLORIDE: 100 mmol/L — AB (ref 101–111)
CO2: 32 mmol/L (ref 22–32)
CREATININE: 1.35 mg/dL — AB (ref 0.44–1.00)
Calcium: 8.4 mg/dL — ABNORMAL LOW (ref 8.9–10.3)
GFR calc non Af Amer: 45 mL/min — ABNORMAL LOW (ref >60)
GFR, EST AFRICAN AMERICAN: 52 mL/min — AB (ref >60)
GLUCOSE: 123 mg/dL — AB (ref 65–99)
POTASSIUM: 4.7 mmol/L (ref 3.5–5.1)
Sodium: 139 mmol/L (ref 135–145)

## 2015-10-09 LAB — GLUCOSE, CAPILLARY
GLUCOSE-CAPILLARY: 134 mg/dL — AB (ref 65–99)
GLUCOSE-CAPILLARY: 125 mg/dL — AB (ref 65–99)
Glucose-Capillary: 119 mg/dL — ABNORMAL HIGH (ref 65–99)
Glucose-Capillary: 119 mg/dL — ABNORMAL HIGH (ref 65–99)
Glucose-Capillary: 117 mg/dL — ABNORMAL HIGH (ref 65–99)
Glucose-Capillary: 154 mg/dL — ABNORMAL HIGH (ref 65–99)

## 2015-10-09 MED ORDER — ZINC OXIDE 20 % EX OINT
TOPICAL_OINTMENT | CUTANEOUS | Status: DC | PRN
  Filled 2015-10-09: qty 28.35

## 2015-10-09 NOTE — Progress Notes (Signed)
In consultation with Dr. [name], ethics consult called to [facility] Ethics committee for support of [facility] ethics committee, goals of care and potential timeline for achieving goals of care.  Courtney [name] from the [facility] Ethics committee responded. He states he and the [facility] ethics committee will support the [facility] ethics committee to potentially arrange a meeting with the family, medical and nursing staff to discuss ways to optimize high quality, safe and effective care, discuss goals of care and a potential timeline to achieve outcomes.  Mr. Courtney [name] reports he will check back on Tuesday to set up a potential meeting with the family and others involved in the patient's care.

## 2015-10-09 NOTE — Progress Notes (Signed)
Order placed to D/C tube feedings. Verbal order given by Dr. [name] in am 11/20 and written in his notes from 11/20 am. TF discontinued at noon without complications.

## 2015-10-09 NOTE — Progress Notes (Signed)
Subjective:  Husband at bedside this AM. Currently off the ventilator.  Had HD on Friday. Seems to have more edema on exam at present.  Having diarrhea.     Objective:  Vital signs in last 24 hours:  Temp:  [98.1 F (36.7 C)-98.7 F (37.1 C)] 98.1 F (36.7 C) (11/19 2000) Pulse Rate:  [101-114] 114 (11/20 0600) Resp:  [3-30] 13 (11/20 0600) BP: (82-124)/(38-75) 111/40 mmHg (11/20 0600) SpO2:  [92 %-100 %] 97 % (11/20 0825) FiO2 (%):  [40 %] 40 % (11/20 0825)  Weight change:  Filed Weights   10/04/15 1230 10/05/15 0400 10/06/15 0429  Weight: 97 kg (213 lb 13.5 oz) 100 kg (220 lb 7.4 oz) 97 kg (213 lb 13.5 oz)    Intake/Output: I/O last 3 completed shifts: In: 2314 [I.V.:214; NG/GT:2100] Out: -    Intake/Output this shift:     Physical Exam: General: No acute distress, chronically ill appearing  Head: NG in place, OM moist  Eyes: anicteric  Neck: Tracheostomy in place  Lungs:  Scattered rhonchi, normal effort  Heart: S1S2 +tachycardia  Abdomen:  Soft, nontender, BS present  Extremities: + dependent edema, anasarca  Neurologic: Lethargic but arousable  Access:  R IJ permcath.       Basic Metabolic Panel:  Recent Labs Lab 10/03/15 0412 10/04/15 0900 10/07/15 1600 10/09/15 0506  NA 144 140 141 139  K 4.3 3.9 4.3 4.7  CL 103 102 100* 100*  CO2 34* 33* 34* 32  GLUCOSE 149* 107* 146* 123*  BUN 82* 62* 64* 91*  CREATININE 1.39* 1.08* 0.98 1.35*  CALCIUM 8.3* 8.2* 8.4* 8.4*  MG 1.7 1.8  --   --   PHOS 4.2 3.4 3.4  --     Liver Function Tests:  Recent Labs Lab 10/07/15 1600  ALBUMIN 2.6*   No results for input(s): LIPASE, AMYLASE in the last 168 hours. No results for input(s): AMMONIA in the last 168 hours.  CBC:  Recent Labs Lab 10/03/15 0412 10/04/15 0900 10/07/15 1600 10/09/15 0506  WBC 12.2* 12.6* 11.7* 9.1  HGB 8.5* 9.1* 8.8* 8.0*  HCT 27.4* 29.6* 28.5* 25.7*  MCV 103.4* 102.7* 103.4* 102.7*  PLT 183 205 201 191    Cardiac  Enzymes: No results for input(s): CKTOTAL, CKMB, CKMBINDEX, TROPONINI in the last 168 hours.  BNP: Invalid input(s): POCBNP  CBG:  Recent Labs Lab 10/08/15 1614 10/08/15 1934 10/08/15 2339 10/09/15 0324 10/09/15 0737  GLUCAP 102* 110* 124* 134* 119*    Microbiology: Results for orders placed or performed during the hospital encounter of 08-31-2015  Blood Culture (routine x 2)     Status: None   Collection Time: 2015-08-31  8:51 AM  Result Value Ref Range Status   Specimen Description BLOOD [name]  Final   Special Requests BOTTLES DRAWN AEROBIC AND ANAEROBIC  3CC  Final   Culture NO GROWTH 5 DAYS  Final   Report Status 08/17/2015 FINAL  Final  Blood Culture (routine x 2)     Status: None   Collection Time: 08/31/2015  9:20 AM  Result Value Ref Range Status   Specimen Description BLOOD LEFT ARM  Final   Special Requests BOTTLES DRAWN AEROBIC AND ANAEROBIC  1CC  Final   Culture NO GROWTH 5 DAYS  Final   Report Status 08/17/2015 FINAL  Final  Wound culture     Status: None   Collection Time: 08-31-2015  9:20 AM  Result Value Ref Range Status   Specimen Description DECUBITIS  Final   Special Requests Normal  Final   Gram Stain   Final    FEW WBC SEEN MANY GRAM NEGATIVE RODS RARE GRAM POSITIVE COCCI    Culture   Final    HEAVY GROWTH ESCHERICHIA COLI MODERATE GROWTH ENTEROBACTER AEROGENES PROTEUS MIRABILIS HEAVY GROWTH ENTEROCOCCUS SPECIES VRE HAVE INTRINSIC RESISTANCE TO MOST COMMONLY USED ANTIBIOTICS AND THE ABILITY TO ACQUIRE RESISTANCE TO MOST AVAILABLE ANTIBIOTICS.    Report Status 08/16/2015 FINAL  Final   Organism ID, Bacteria ESCHERICHIA COLI  Final   Organism ID, Bacteria ENTEROBACTER AEROGENES  Final   Organism ID, Bacteria PROTEUS MIRABILIS  Final   Organism ID, Bacteria ENTEROCOCCUS SPECIES  Final      Susceptibility   Enterobacter aerogenes - MIC*    CEFTAZIDIME <=1 SENSITIVE Sensitive     CEFAZOLIN >=64 RESISTANT Resistant     CEFTRIAXONE <=1 SENSITIVE  Sensitive     CIPROFLOXACIN <=0.25 SENSITIVE Sensitive     GENTAMICIN <=1 SENSITIVE Sensitive     IMIPENEM 1 SENSITIVE Sensitive     TRIMETH/SULFA <=20 SENSITIVE Sensitive     * MODERATE GROWTH ENTEROBACTER AEROGENES   Escherichia coli - MIC*    AMPICILLIN <=2 SENSITIVE Sensitive     CEFTAZIDIME <=1 SENSITIVE Sensitive     CEFAZOLIN <=4 SENSITIVE Sensitive     CEFTRIAXONE <=1 SENSITIVE Sensitive     CIPROFLOXACIN <=0.25 SENSITIVE Sensitive     GENTAMICIN <=1 SENSITIVE Sensitive     IMIPENEM <=0.25 SENSITIVE Sensitive     TRIMETH/SULFA <=20 SENSITIVE Sensitive     * HEAVY GROWTH ESCHERICHIA COLI   Proteus mirabilis - MIC*    AMPICILLIN >=32 RESISTANT Resistant     CEFTAZIDIME <=1 SENSITIVE Sensitive     CEFAZOLIN 8 SENSITIVE Sensitive     CEFTRIAXONE <=1 SENSITIVE Sensitive     CIPROFLOXACIN <=0.25 SENSITIVE Sensitive     GENTAMICIN <=1 SENSITIVE Sensitive     IMIPENEM 1 SENSITIVE Sensitive     TRIMETH/SULFA <=20 SENSITIVE Sensitive     * PROTEUS MIRABILIS   Enterococcus species - MIC*    AMPICILLIN >=32 RESISTANT Resistant     VANCOMYCIN >=32 RESISTANT Resistant     GENTAMICIN SYNERGY SENSITIVE Sensitive     TETRACYCLINE Value in next row Resistant      RESISTANT>=16    * HEAVY GROWTH ENTEROCOCCUS SPECIES  MRSA PCR Screening     Status: None   Collection Time: 07/24/2015  2:38 PM  Result Value Ref Range Status   MRSA by PCR NEGATIVE NEGATIVE Final    Comment:        The GeneXpert MRSA Assay (FDA approved for NASAL specimens only), is one component of a comprehensive MRSA colonization surveillance program. It is not intended to diagnose MRSA infection nor to guide or monitor treatment for MRSA infections.   Blood culture (single)     Status: None   Collection Time: 07/24/2015  3:36 PM  Result Value Ref Range Status   Specimen Description BLOOD RIGHT ASSIST CONTROL  Final   Special Requests BOTTLES DRAWN AEROBIC AND ANAEROBIC  Final   Culture NO GROWTH 5 DAYS  Final    Report Status 08/17/2015 FINAL  Final  Wound culture     Status: None   Collection Time: 08/13/15 12:37 PM  Result Value Ref Range Status   Specimen Description WOUND  Final   Special Requests Normal  Final   Gram Stain   Final    FEW WBC SEEN TOO NUMEROUS TO COUNT GRAM  NEGATIVE RODS FEW GRAM POSITIVE COCCI    Culture   Final    HEAVY GROWTH ESCHERICHIA COLI MODERATE GROWTH PROTEUS MIRABILIS LIGHT GROWTH KLEBSIELLA PNEUMONIAE MODERATE GROWTH ENTEROCOCCUS GALLINARUM CRITICAL RESULT CALLED TO, READ BACK BY AND VERIFIED WITH: [facility] BORBA AT 1042 08/16/15 DV    Report Status 08/17/2015 FINAL  Final   Organism ID, Bacteria ESCHERICHIA COLI  Final   Organism ID, Bacteria PROTEUS MIRABILIS  Final   Organism ID, Bacteria KLEBSIELLA PNEUMONIAE  Final   Organism ID, Bacteria ENTEROCOCCUS GALLINARUM  Final      Susceptibility   Escherichia coli - MIC*    AMPICILLIN >=32 RESISTANT Resistant     CEFTAZIDIME 4 RESISTANT Resistant     CEFAZOLIN >=64 RESISTANT Resistant     CEFTRIAXONE 16 RESISTANT Resistant     GENTAMICIN 2 SENSITIVE Sensitive     IMIPENEM >=16 RESISTANT Resistant     TRIMETH/SULFA <=20 SENSITIVE Sensitive     Extended ESBL POSITIVE Resistant     PIP/TAZO Value in next row Resistant      RESISTANT>=128    CIPROFLOXACIN Value in next row Sensitive      SENSITIVE<=0.25    * HEAVY GROWTH ESCHERICHIA COLI   Klebsiella pneumoniae - MIC*    AMPICILLIN Value in next row Resistant      SENSITIVE<=0.25    CEFTAZIDIME Value in next row Resistant      SENSITIVE<=0.25    CEFAZOLIN Value in next row Resistant      SENSITIVE<=0.25    CEFTRIAXONE Value in next row Resistant      SENSITIVE<=0.25    CIPROFLOXACIN Value in next row Resistant      SENSITIVE<=0.25    GENTAMICIN Value in next row Sensitive      SENSITIVE<=0.25    IMIPENEM Value in next row Resistant      SENSITIVE<=0.25    TRIMETH/SULFA Value in next row Resistant      SENSITIVE<=0.25    PIP/TAZO Value in next  row Resistant      RESISTANT>=128    * LIGHT GROWTH KLEBSIELLA PNEUMONIAE   Proteus mirabilis - MIC*    AMPICILLIN Value in next row Resistant      RESISTANT>=128    CEFTAZIDIME Value in next row Sensitive      RESISTANT>=128    CEFAZOLIN Value in next row Sensitive      RESISTANT>=128    CEFTRIAXONE Value in next row Sensitive      RESISTANT>=128    CIPROFLOXACIN Value in next row Sensitive      RESISTANT>=128    GENTAMICIN Value in next row Sensitive      RESISTANT>=128    IMIPENEM Value in next row Sensitive      RESISTANT>=128    TRIMETH/SULFA Value in next row Sensitive      RESISTANT>=128    PIP/TAZO Value in next row Sensitive      SENSITIVE<=4    * MODERATE GROWTH PROTEUS MIRABILIS   Enterococcus gallinarum - MIC*    AMPICILLIN Value in next row Resistant      SENSITIVE<=4    GENTAMICIN SYNERGY Value in next row Sensitive      SENSITIVE<=4    CIPROFLOXACIN Value in next row Resistant      RESISTANT>=8    TETRACYCLINE Value in next row Resistant      RESISTANT>=16    * MODERATE GROWTH ENTEROCOCCUS GALLINARUM  Wound culture     Status: None   Collection Time: 08/15/15  2:53 PM  Result  Value Ref Range Status   Specimen Description WOUND  Final   Special Requests NONE  Final   Gram Stain FEW WBC SEEN NO ORGANISMS SEEN   Final   Culture NO GROWTH 3 DAYS  Final   Report Status 08/18/2015 FINAL  Final  C difficile quick scan w PCR reflex     Status: None   Collection Time: 08/17/15 11:34 AM  Result Value Ref Range Status   C Diff antigen NEGATIVE NEGATIVE Final   C Diff toxin NEGATIVE NEGATIVE Final   C Diff interpretation Negative for C. difficile  Final  Stool culture     Status: None   Collection Time: 09/03/15  3:51 PM  Result Value Ref Range Status   Specimen Description STOOL  Final   Special Requests Immunocompromised  Final   Culture   Final    NO SALMONELLA OR SHIGELLA ISOLATED No Pathogenic E. coli detected NO CAMPYLOBACTER DETECTED    Report  Status 09/07/2015 FINAL  Final  C difficile quick scan w PCR reflex     Status: None   Collection Time: 09/13/15 12:51 AM  Result Value Ref Range Status   C Diff antigen NEGATIVE NEGATIVE Final   C Diff toxin NEGATIVE NEGATIVE Final   C Diff interpretation Negative for C. difficile  Final  Culture, blood (routine x 2)     Status: None   Collection Time: 09/16/15 11:24 AM  Result Value Ref Range Status   Specimen Description BLOOD LEFT HAND  Final   Special Requests BOTTLES DRAWN AEROBIC AND ANAEROBIC  1CC  Final   Culture NO GROWTH 5 DAYS  Final   Report Status 09/21/2015 FINAL  Final  Culture, blood (routine x 2)     Status: None   Collection Time: 09/16/15 12:23 PM  Result Value Ref Range Status   Specimen Description BLOOD RIGHT HAND  Final   Special Requests BOTTLES DRAWN AEROBIC AND ANAEROBIC  1CC  Final   Culture  Setup Time   Final    GRAM POSITIVE COCCI AEROBIC BOTTLE ONLY CRITICAL RESULT CALLED TO, READ BACK BY AND VERIFIED WITH: TESS THOMAS,RN 09/17/2015 0631 BY JRS.    Culture   Final    ENTEROCOCCUS FAECALIS AEROBIC BOTTLE ONLY VRE HAVE INTRINSIC RESISTANCE TO MOST COMMONLY USED ANTIBIOTICS AND THE ABILITY TO ACQUIRE RESISTANCE TO MOST AVAILABLE ANTIBIOTICS. CRITICAL RESULT CALLED TO, READ BACK BY AND VERIFIED WITH: CHERYL SMITH AT 6045 09/19/15 DV    Report Status 09/21/2015 FINAL  Final   Organism ID, Bacteria ENTEROCOCCUS FAECALIS  Final      Susceptibility   Enterococcus faecalis - MIC*    AMPICILLIN <=2 SENSITIVE Sensitive     LINEZOLID 2 SENSITIVE Sensitive     CIPROFLOXACIN Value in next row Resistant      RESISTANT>=8    TETRACYCLINE Value in next row Resistant      RESISTANT>=16    VANCOMYCIN Value in next row Resistant      RESISTANT>=32    GENTAMICIN SYNERGY Value in next row Resistant      RESISTANT>=32    * ENTEROCOCCUS FAECALIS  Culture, respiratory (NON-Expectorated)     Status: None   Collection Time: 09/16/15  3:50 PM  Result Value Ref Range  Status   Specimen Description TRACHEAL ASPIRATE  Final   Special Requests Immunocompromised  Final   Gram Stain   Final    FEW WBC SEEN GOOD SPECIMEN - 80-90% WBCS RARE GRAM NEGATIVE RODS    Culture LIGHT  GROWTH PSEUDOMONAS AERUGINOSA  Final   Report Status 09/19/2015 FINAL  Final   Organism ID, Bacteria PSEUDOMONAS AERUGINOSA  Final      Susceptibility   Pseudomonas aeruginosa - MIC*    CEFTAZIDIME 8 SENSITIVE Sensitive     CIPROFLOXACIN 2 INTERMEDIATE Intermediate     GENTAMICIN >=16 RESISTANT Resistant     IMIPENEM >=16 RESISTANT Resistant     PIP/TAZO Value in next row Sensitive      SENSITIVE32    CEFEPIME Value in next row Sensitive      SENSITIVE8    LEVOFLOXACIN Value in next row Resistant      RESISTANT>=8    * LIGHT GROWTH PSEUDOMONAS AERUGINOSA  Culture, expectorated sputum-assessment     Status: None   Collection Time: 09/22/15  2:09 PM  Result Value Ref Range Status   Specimen Description ENDOTRACHEAL  Final   Special Requests Normal  Final   Sputum evaluation THIS SPECIMEN IS ACCEPTABLE FOR SPUTUM CULTURE  Final   Report Status 09/24/2015 FINAL  Final  Culture, respiratory (NON-Expectorated)     Status: None   Collection Time: 09/22/15  2:09 PM  Result Value Ref Range Status   Specimen Description ENDOTRACHEAL  Final   Special Requests Normal Reflexed from [id number]  Final   Gram Stain   Final    FEW WBC SEEN FEW GRAM NEGATIVE RODS POOR SPECIMEN - LESS THAN 70% WBCS    Culture   Final    MODERATE GROWTH PSEUDOMONAS AERUGINOSA LIGHT GROWTH KLEBSIELLA PNEUMONIAE REFER TO SENSITIVITIES FROM PREVIOUS CULTURE FOR ORG 2    Report Status 10/01/2015 FINAL  Final   Organism ID, Bacteria PSEUDOMONAS AERUGINOSA  Final   Organism ID, Bacteria KLEBSIELLA PNEUMONIAE  Final      Susceptibility   Pseudomonas aeruginosa - MIC*    CEFTAZIDIME 4 SENSITIVE Sensitive     CIPROFLOXACIN 2 INTERMEDIATE Intermediate     GENTAMICIN >=16 RESISTANT Resistant     IMIPENEM >=16  RESISTANT Resistant     PIP/TAZO Value in next row Sensitive      SENSITIVE16    * MODERATE GROWTH PSEUDOMONAS AERUGINOSA  Tissue culture     Status: None   Collection Time: 09/24/15  6:44 AM  Result Value Ref Range Status   Specimen Description BONE  Final   Special Requests Normal  Final   Gram Stain MODERATE WBC SEEN FEW GRAM NEGATIVE RODS   Final   Culture   Final    MODERATE GROWTH ESCHERICHIA COLI MODERATE GROWTH KLEBSIELLA PNEUMONIAE ESBL-EXTENDED SPECTRUM BETA LACTAMASE-THE ORGANISM IS RESISTANT TO PENICILLINS, CEPHALOSPORINS AND AZTREONAM ACCORDING TO CLSI [id number] VOL.25 N01 JAN 2005. ORGANISM 1 This organism isolate is resistant to one or more antiotic agents in three or more antimicrobial categories.  Suggest Infectious Disease consult.   ORGANISM 2 CRITICAL RESULT CALLED TO, READ BACK BY AND VERIFIED WITH: RN Mardene Celeste LINDSAY 09/27/15 1005AM    Report Status 09/28/2015 FINAL  Final   Organism ID, Bacteria ESCHERICHIA COLI  Final   Organism ID, Bacteria KLEBSIELLA PNEUMONIAE  Final      Susceptibility   Escherichia coli - MIC*    AMPICILLIN >=32 RESISTANT Resistant     CEFTAZIDIME 4 RESISTANT Resistant     CEFAZOLIN >=64 RESISTANT Resistant     CEFTRIAXONE 8 RESISTANT Resistant     GENTAMICIN <=1 SENSITIVE Sensitive     IMIPENEM 8 RESISTANT Resistant     TRIMETH/SULFA <=20 SENSITIVE Sensitive     Extended ESBL POSITIVE Resistant  PIP/TAZO Value in next row Resistant      RESISTANT>=128    * MODERATE GROWTH ESCHERICHIA COLI   Klebsiella pneumoniae - MIC*    AMPICILLIN Value in next row Resistant      RESISTANT>=128    CEFTAZIDIME Value in next row Resistant      RESISTANT>=128    CEFAZOLIN Value in next row Resistant      RESISTANT>=128    CEFTRIAXONE Value in next row Resistant      RESISTANT>=128    CIPROFLOXACIN Value in next row Resistant      RESISTANT>=128    GENTAMICIN Value in next row Resistant      RESISTANT>=128    IMIPENEM Value in next row  Resistant      RESISTANT>=128    TRIMETH/SULFA Value in next row Sensitive      RESISTANT>=128    PIP/TAZO Value in next row Resistant      RESISTANT>=128    * MODERATE GROWTH KLEBSIELLA PNEUMONIAE  Anaerobic culture     Status: None   Collection Time: 09/24/15  3:55 PM  Result Value Ref Range Status   Specimen Description BONE  Final   Special Requests Normal  Final   Culture NO ANAEROBES ISOLATED  Final   Report Status 09/29/2015 FINAL  Final  C difficile quick scan w PCR reflex     Status: None   Collection Time: 10/07/15  2:02 PM  Result Value Ref Range Status   C Diff antigen NEGATIVE NEGATIVE Final   C Diff toxin NEGATIVE NEGATIVE Final   C Diff interpretation Negative for C. difficile  Final    Coagulation Studies: No results for input(s): LABPROT, INR in the last 72 hours.  Urinalysis: No results for input(s): COLORURINE, LABSPEC, PHURINE, GLUCOSEU, HGBUR, BILIRUBINUR, KETONESUR, PROTEINUR, UROBILINOGEN, NITRITE, LEUKOCYTESUR in the last 72 hours.  Invalid input(s): APPERANCEUR    Imaging: Dg Chest 1 View  10/09/2015  CLINICAL DATA:  Patient with respiratory failure. EXAM: CHEST 1 VIEW COMPARISON:  Chest radiograph 10/03/2015. FINDINGS: Central venous catheter tip projects over the right atrium. Tracheostomy tube tip projects over mid trachea. Left IJ central venous catheter tip projects over the superior vena cava. Enteric tube courses inferior to the diaphragm. Stable cardiomegaly. Low lung volumes. Unchanged heterogeneous opacities left greater than right lung bases. Small bilateral pleural effusions. No pneumothorax. IMPRESSION: Stable support apparatus. Cardiomegaly. Small bilateral pleural effusions. Heterogeneous opacities left lung base favored to represent atelectasis. Electronically Signed   By: Annia Beltrew  Davis M.D.   On: 10/09/2015 09:23     Medications:   . feeding supplement (VITAL AF 1.2 CAL) 1,000 mL (10/08/15 2235)  . norepinephrine Stopped (10/08/15 0530)    . amantadine  100 mg Per Tube BID  . antiseptic oral rinse  7 mL Mouth Rinse q12n4p  . aspirin  81 mg Oral Daily  . chlorhexidine  15 mL Mouth Rinse BID  . epoetin (EPOGEN/PROCRIT) injection  10,000 Units Intravenous Q M,W,F-HD  . feeding supplement (PRO-STAT SUGAR FREE 64)  30 mL Per Tube 6 times per day  . folic acid  1 mg Oral Daily  . free water  20 mL Per Tube 6 times per day  . heparin subcutaneous  5,000 Units Subcutaneous Q12H  . hydrocortisone  10 mg Per Tube BID  . insulin aspart  0-9 Units Subcutaneous 6 times per day  . ipratropium-albuterol  3 mL Nebulization QID  . lidocaine  1 patch Transdermal Q24H  . loperamide  2 mg  Oral Daily  . megestrol  400 mg Oral BID  . midodrine  5 mg Per Tube TID WC  . multivitamin  5 mL Oral Daily  . pantoprazole sodium  40 mg Per Tube Daily  . sodium chloride  10-40 mL Intracatheter Q12H  . sodium hypochlorite   Irrigation BID  . sulfamethoxazole-trimethoprim  2.5 tablet Per Tube q1800  . thiamine IV  100 mg Intravenous Daily   sodium chloride, sodium chloride, acetaminophen (TYLENOL) oral liquid 160 mg/5 mL, HYDROmorphone (DILAUDID) injection, morphine injection, ondansetron (ZOFRAN) IV, oxyCODONE, sodium chloride, zinc oxide  Assessment/ Plan:  49 y.o. 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 Admitted on 08/17/2015  1. End-stage renal disease on hemodialysis on HD  MWF. The patient has been on dialysis since October of 2014. R IJ permcath.  Tolerated dialysis treatment with IV albumin for oncotic support.  - Pt seen at bedside, now off the vent today.  No urgent indication for HD today, will plan for HD again tomorrow.  2. Anemia of CKD: hgb down to 8, continue epogen with HD.  3. Secondary hyperparathyroidism: PTH low at 55 -phos 3.4 at last check, no need for binders.   4. Sepsis: VRE in blood. Bone cultures E. Coli and klebsiella - Treatment as per ID and ICU team: daptomycin completed on 11/10 and now started on trimethoprim/sulfamethoxazole.   5. Generalized Dependent edema/Anasarca due to 3rd spacing - edema seems a bit more prominent, has had some difficulty tolerating UF at times, will plan for HD with HD tomorrow.  6. Acute resp failure - currently off the ventilator this AM, was on Friday and Saturday.  LOS: 58 Rexine Gowens 11/20/201610:50 AM

## 2015-10-09 NOTE — Progress Notes (Signed)
   10/09/15 1300  Clinical Encounter Type  Visited With Patient and family together  Visit Type Follow-up;Spiritual support  Consult/Referral To Chaplain  Spiritual Encounters  Spiritual Needs Emotional  Stress Factors  Patient Stress Factors Not reviewed  Family Stress Factors Other (Comment)  Chaplain rounded in the unit and offered a compassionate presence, a listening ear and support as applicable to patient and spouse. Chaplain Theo Krumholz A. Zierra Laroque Ext. [phone]

## 2015-10-09 NOTE — Progress Notes (Signed)
[facility] [place] [facility] Medicine Progess Note  Name: Courtney GrinderHedda McMillian [name] MRN: [id number] DOB: 08-24-1966    ADMISSION DATE:  2015-10-11     INITIAL PRESENTATION:  49 AAF who has been in medical facilities (hosp, LTAC, rehab) for 2 yrs following gastric bypass surgery with multiple complications. Now with chronic trach, ESRD, profound debilitation, severe sacral pressure ulcer. Was seemingly making progress and transferred to rehab facility approx one week prior to this admission. She [facility] with AMS and hypotension. Working dx of severe sepsis/septic shock due to infected sacral pressure ulcer. Since admission. Her course is been very complicated with numerous complications including septic shock and GI bleeding.    INTERVAL HISTORY:  The patient is off the ventilator this morning, she is back on trach collar. She is awake and alert and has no particular complaints. The patient's husband is in the room. He was updated on the patient's care. Our notes, the patient continued to have diarrhea overnight, patient has concerns about the patient's diarrhea and his tube feeds. When necessary asked me about potentially placing a rectal tube.   [name] changed out due to cuff leak on  11/5 PM, #6 Shiley cuffed,  The patient was started on Megace and thiamine, on 09/22/2015 per the husband's request.  11/7>>husband at bedside updated on current plan. Plan for HD today, cont with TF, cont with daptomycin, awaiting bone cx and s/s from trach aspirate. Pressure in pilot balloon of trach in the 50s, if cont to be this high, might need to upsize trach.    11/8> no acute events overnight, mild thin secretions noted, was on the vent at 35% FiO2 during the night. Creatinine slightly worse compared to yesterday; had dialysis yesterday, 1 L removed. Sacral Bone culture shows Klebsiella pneumonia and Escherichia coli  11/9> placed on ventilator rate last night, today tolerating dialysis, goal  removal is 1 L, we'll attempt trach collar trials today, patient more interactive, will open eyes and nod. Patient is currently on Septra and daptomycin.  11/10>tolerated about 8 hrs off TCT yesterday, placed back on vent overnight, somnolent this morning but more alert as the day progresses.  HD with 1 liter removed yesterday, BP stable, not requiring pressors. RT report inc secretion, will obtain CXR and labs today.  11/14 currently on TCT, complains of back pain   MAJOR EVENTS/TEST RESULTS: Admission 02/07/14-05/07/14 Admission 07/21/14-09/06/14 Discharged to [facility]. Pt had palliative consult at that time, were asked to sign off by husband.  09/23 CT head: NAD 09/23 EEG: no epileptiform activity 09/23 PRBCs for Hgb 6.4 09/24 bedside debridement of sacral wound. Abscess drained 09/25 Off vasopressors. More alert. No distress. Worsening thrombocytopenia. Vanc DC'd 09/29  Dr. [name] (I.D) excused from the case by patient's husband. 10/02 MRI -multiple infarcts 10/03 tracheal bleeding- transfused platelets 10/03 hospitalist service excused from the case by patient's husband 10/03 Echocardiogram ejection fraction was 55-60%, pulmonary systolic pressure was 39 mmHg 40/9810/08 restart TF's at lower rate, attempt reg HD  10/12 Transferred to med-surg floor. Remains on PCCM service 10/14 SLP eval: pt unable to tolerate PMV adequately 10/17-will re-attempt PM valve-discussed with Speech therapist 10/18 passed swallow eval-start pureed thick foods no thin liquids-continue NG feeds 10/19 transferred to step down for sepsis/aspiration pneumonia 10/19 cxr shows RLL opacity 10/21 sacral decub debride at bedside by surgery 10/22 started back on vasopressors while on HD 10/27 CT with osteo, R hip fx, unable to identify tip of dubhoff tube - sent for fluoro  study 10/28 Ortho consultation: I do not feel that she is a surgical candidate. Therefore, I feel that it would best to manage this fracture  nonsurgically and allow it to heal by itself over time, which it should.  10/28 Gen Surg consultation: Due to the lack of free air or free flow of contrast and the peritoneum there is no indication for any surgical intervention on this. Would recommend pulling back the feeding tube 1-2 cm. Would recheck an abdominal film to confirm no pneumoperitoneum in the morning. Absence of any changes okay to continue using Dobbhoff for feeding and medications. 10/28 gastrograffin study: The study confirms that the feeding tube pes perforated through the duodenum and the tip is within a cavity that fills with injected contrast. 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 Pneumoniae, P. Mirabilis, VRE Wound 9/26 >> No growth Blood 9/23 >> NEG CDiff 9/27>>neg Stool Cx 10/15>> negative Cdiff 10/25>>neg Trach Aspirate 10/28>> light growth pseudomonas Blood 10/28 >> 1/2 GPC >>  Sputum cultures obtained 09/22/15 due to mucus plugging>>Pseudomonas BONE TISSUE Cx 11/3>>E.coli (ESBL), k. Pneumonia (daptomycin and septra)  ANTIMICROBIALS:  Aztreonam 9/23 >> 9/24 Vanc 9/23 >> 9/25 Vanc 9/26>>9/27 Daptomycin 9/27>> 10/12, 10/28>>  Meropenem 9/23 >>  10/14, 10/28 >> 10/31 Septra 11/8>>   VITAL SIGNS: Temp:  [98.1 F (36.7 C)-98.7 F (37.1 C)] 98.1 F (36.7 C) (11/19 2000) Pulse Rate:  [101-114] 114 (11/20 0600) Resp:  [3-30] 13 (11/20 0600) BP: (82-124)/(35-85) 111/40 mmHg (11/20 0600) SpO2:  [92 %-100 %] 94 % (11/20 0600) FiO2 (%):  [30 %-40 %] 40 % (11/20 0316) HEMODYNAMICS:   VENTILATOR SETTINGS: Vent Mode:  [-]  FiO2 (%):  [30 %-40 %] 40 % INTAKE / OUTPUT:  Intake/Output Summary (Last 24 hours) at 10/09/15 0828 Last data filed at 10/09/15 0600  Gross per 24 hour  Intake   1380 ml  Output      0 ml  Net   1380 ml    PHYSICAL EXAMINATION: Physical Examination:   VS: BP 111/40 mmHg  Pulse 114  Temp(Src) 98.1 F (36.7 C) (Axillary)  Resp 13  Ht  (1.753 m)  Wt 97 kg (213 lb 13.5 oz)  BMI 31.57 kg/m2  SpO2 94%  LMP  (LMP Unknown)  General Appearance: No resp distress, coarse upper airway sounds, off vent, on TCT Neuro: profoundly diffusely weak, alert today, nodding head HEENT: cushingoid facies, PERRLA, EOM intact Neck: trach site clean Pulmonary: clear anteriorly Cardiovascular: reg, + syst murmur Abdomen: soft, NT, +BS Extremities: warm, R>L UE edema Examination of the sacral decubitus ulcer- showed a very deep ulcer approximately 7-8 cm across.   stage IV ulcer.   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 BMET intermittently Monitor I/Os Correct electrolytes as indicated Intermittent HD per Renal Service (MWF-dialysis today) CXR today,   GASTROINTESTINAL A:  LGIB, resolved Hx of C. Diff Stool incontinence  P:  Cont PPI Continued watery diarrhea, discussed with the care nurse and the husband at the bedside regarding possible options including placement of a rectal tube. Given her recent GI bleeding which was life-threatening due to rectal ulcer. I would not place a rectal tube at this time. Patient is currently on low-dose aspirin, for stroke prophylaxis at the husband's request. Our lasted a rectal pouch, which would not be inserted in the patient's body. I think this would be appropriate, and strike and appropriate balance between preventing contamination of her wound, and that increasing her risk of a bleed. In addition, I will put the tube feedings on hold to try to control the diarrhea better.  HEMATOLOGIC A:  Acute on chronic anemia S/p Thrombocytopenia - HIT panel negative 9/25, resolved P: DVT px: SCDs + SQ heparin Monitor CBC intermittently Transfuse per usual ICU guidelines  INFECTIOUS A:  Recurrent Severe sepsis, due to sacral decub, currently stable.  Sacral decubitus ulcer with abscess - on Septra  ID following along as needed, appreciate recs - currently on septra following bone biopsy. P:  Monitor temp, WBC count Micro and abx as above -follow ID recs  ENDOCRINE A:  -DM 2, controlled.  -Chronic steroid therapy, for a history of of adrenal insufficiency. P:  Monitor CBGs q 8 hrs - resume SSI if > 180 Cont hydrocortisone @ 10 mg BID Continue  tube feeds. Monitor blood glucose levels.  MSK A: Right hip fx - small intertrochanteric R fx - pain management. Ortho evaluated and felt not a surgical candidate at this  time.   NEUROLOGIC-critical illness polyneuropathy A:  Acute encephalopathy, now with chronic metabolic encephalopathy. The patient appears to have waxing and waning mental status, she was evaluated by both the psychiatry and neurology services, and it was deemed that the patient lacks decisional capacity at this time. 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  Critical Care Attestation.  I have personally obtained a history, examined the patient, evaluated laboratory and imaging results, formulated the assessment and plan and placed orders. The Patient requires high complexity decision making for assessment and support, frequent evaluation and titration of therapies, application of advanced monitoring technologies and extensive interpretation of multiple databases. The patient has critical illness that could lead imminently to failure of  1 or more organ systems and requires the highest level of physician preparedness to intervene.  Critical Care Time devoted to patient care services described in this note is 35 minutes and is exclusive of time spent in procedures.

## 2015-10-10 LAB — BLOOD GAS, ARTERIAL
ACID-BASE EXCESS: 9.2 mmol/L — AB (ref 0.0–3.0)
Allens test (pass/fail): POSITIVE — AB
BICARBONATE: 34.8 meq/L — AB (ref 21.0–28.0)
FIO2: 0.4
O2 Saturation: 99.4 %
PCO2 ART: 50 mmHg — AB (ref 32.0–48.0)
PH ART: 7.45 (ref 7.350–7.450)
PO2 ART: 153 mmHg — AB (ref 83.0–108.0)
Patient temperature: 37

## 2015-10-10 LAB — BASIC METABOLIC PANEL
Anion gap: 11 (ref 5–15)
BUN: 100 mg/dL — AB (ref 6–20)
CALCIUM: 8.5 mg/dL — AB (ref 8.9–10.3)
CO2: 31 mmol/L (ref 22–32)
Chloride: 99 mmol/L — ABNORMAL LOW (ref 101–111)
Creatinine, Ser: 1.63 mg/dL — ABNORMAL HIGH (ref 0.44–1.00)
GFR calc Af Amer: 41 mL/min — ABNORMAL LOW (ref >60)
GFR, EST NON AFRICAN AMERICAN: 36 mL/min — AB (ref >60)
GLUCOSE: 81 mg/dL (ref 65–99)
Potassium: 5.2 mmol/L — ABNORMAL HIGH (ref 3.5–5.1)
SODIUM: 141 mmol/L (ref 135–145)

## 2015-10-10 LAB — GLUCOSE, CAPILLARY
GLUCOSE-CAPILLARY: 68 mg/dL (ref 65–99)
GLUCOSE-CAPILLARY: 71 mg/dL (ref 65–99)
GLUCOSE-CAPILLARY: 83 mg/dL (ref 65–99)
GLUCOSE-CAPILLARY: 91 mg/dL (ref 65–99)
Glucose-Capillary: 76 mg/dL (ref 65–99)
Glucose-Capillary: 68 mg/dL (ref 65–99)
Glucose-Capillary: 76 mg/dL (ref 65–99)

## 2015-10-10 LAB — CBC
HCT: 26.6 % — ABNORMAL LOW (ref 35.0–47.0)
Hemoglobin: 8.4 g/dL — ABNORMAL LOW (ref 12.0–16.0)
MCH: 32.1 pg (ref 26.0–34.0)
MCHC: 31.6 g/dL — ABNORMAL LOW (ref 32.0–36.0)
MCV: 101.3 fL — AB (ref 80.0–100.0)
PLATELETS: 195 10*3/uL (ref 150–440)
RBC: 2.62 MIL/uL — AB (ref 3.80–5.20)
RDW: 18.6 % — AB (ref 11.5–14.5)
WBC: 7.7 10*3/uL (ref 3.6–11.0)

## 2015-10-10 MED ORDER — ALBUMIN HUMAN 25 % IV SOLN
25.0000 g | Freq: Once | INTRAVENOUS | Status: AC
  Administered 2015-10-10: 25 g via INTRAVENOUS
  Filled 2015-10-10: qty 100

## 2015-10-10 MED ORDER — PRO-STAT SUGAR FREE PO LIQD
30.0000 mL | Freq: Every day | ORAL | Status: DC
  Administered 2015-10-10 – 2015-10-18 (×46): 30 mL via ORAL
  Administered 2015-10-18: 12:00:00 via ORAL
  Administered 2015-10-19 – 2015-11-14 (×160): 30 mL via ORAL
  Administered 2015-11-15: 06:00:00 via ORAL
  Administered 2015-11-15 – 2015-11-23 (×42): 30 mL via ORAL

## 2015-10-10 MED ORDER — VITAL HIGH PROTEIN PO LIQD
1000.0000 mL | ORAL | Status: DC
  Administered 2015-10-10: 1000 mL

## 2015-10-10 MED ORDER — VITAL AF 1.2 CAL PO LIQD
1000.0000 mL | ORAL | Status: DC
  Administered 2015-10-10 – 2015-10-17 (×7): 1000 mL

## 2015-10-10 MED ORDER — OXYCODONE HCL 5 MG/5ML PO SOLN
5.0000 mg | Freq: Four times a day (QID) | ORAL | Status: DC | PRN
Start: 2015-10-10 — End: 2015-12-06
  Administered 2015-10-11 – 2015-12-03 (×50): 5 mg
  Filled 2015-10-10 (×53): qty 5

## 2015-10-10 NOTE — Progress Notes (Signed)
Nutrition Follow-up  DOCUMENTATION CODES:   Severe malnutrition in context of acute illness/injury  INTERVENTION:  EN: Consult placed per Dr. Sung AmabileSimonds to restart tube feeding.  Recommend vital 1.2 (elemental formula)at rate of 5450ml/hr with prostat times 6 per day. Will provide 2040 kcals and 195 g/d of protein per day to meet nutritional needs while on vent.    NUTRITION DIAGNOSIS:   Inadequate oral intake related to wound healing, acute illness, chronic illness as evidenced by NPO status, estimated needs.    GOAL:   Patient will meet greater than or equal to 90% of their needs    MONITOR:    (Energy Intake, Anthropometrics, Digestive System, Electrolyte/Renal Profile, Glucose Profile)  REASON FOR ASSESSMENT:   Consult Enteral/tube feeding initiation and management  ASSESSMENT:      Pt placed back on vent via trach, edema noted   Current Nutrition: Tube feeding off per MD order since noon on 11/20 secondary to diarrhea   Gastrointestinal Profile: Last BM: noted from 11/20 at 20:00 until 11/21 at 10:15 3 loose stool documented.   (tube feeding off during this time frame), lopermide continues   Scheduled Medications:  . albumin human  25 g Intravenous Once  . amantadine  100 mg Per Tube BID  . antiseptic oral rinse  7 mL Mouth Rinse q12n4p  . aspirin  81 mg Oral Daily  . chlorhexidine  15 mL Mouth Rinse BID  . epoetin (EPOGEN/PROCRIT) injection  10,000 Units Intravenous Q M,W,F-HD  . feeding supplement (VITAL HIGH PROTEIN)  1,000 mL Per Tube Q24H  . folic acid  1 mg Oral Daily  . free water  20 mL Per Tube 6 times per day  . heparin subcutaneous  5,000 Units Subcutaneous Q12H  . hydrocortisone  10 mg Per Tube BID  . insulin aspart  0-9 Units Subcutaneous 6 times per day  . ipratropium-albuterol  3 mL Nebulization QID  . lidocaine  1 patch Transdermal Q24H  . loperamide  2 mg Oral Daily  . midodrine  5 mg Per Tube TID WC  . multivitamin  5 mL Oral Daily  .  pantoprazole sodium  40 mg Per Tube Daily  . sodium chloride  10-40 mL Intracatheter Q12H  . sulfamethoxazole-trimethoprim  2.5 tablet Per Tube q1800  . thiamine IV  100 mg Intravenous Daily    Continuous Medications:  . norepinephrine Stopped (10/08/15 0530)     Electrolyte/Renal Profile and Glucose Profile:   Recent Labs Lab 10/04/15 0900 10/07/15 1600 10/09/15 0506 10/10/15 0613  NA 140 141 139 141  K 3.9 4.3 4.7 5.2*  CL 102 100* 100* 99*  CO2 33* 34* 32 31  BUN 62* 64* 91* 100*  CREATININE 1.08* 0.98 1.35* 1.63*  CALCIUM 8.2* 8.4* 8.4* 8.5*  MG 1.8  --   --   --   PHOS 3.4 3.4  --   --   GLUCOSE 107* 146* 123* 81   Protein Profile:  Recent Labs Lab 10/07/15 1600  ALBUMIN 2.6*      Weight Trend since Admission: Filed Weights   10/04/15 1230 10/05/15 0400 10/06/15 0429  Weight: 213 lb 13.5 oz (97 kg) 220 lb 7.4 oz (100 kg) 213 lb 13.5 oz (97 kg)      Diet Order:   NPO  Skin:   (stage IV sacrum, stage III hip, unstageable foot; rectal ulcer from fleixseal, ulcer in nose from dobhoff)  Last BM:  +medium BM 10/19  Height:   Ht Readings from  Last 1 Encounters:  08/02/2015  (1.753 m)    Weight:   Wt Readings from Last 1 Encounters:  10/06/15 213 lb 13.5 oz (97 kg)    Ideal Body Weight:     BMI:  Body mass index is 31.57 kg/(m^2).  Estimated Nutritional Needs:   Kcal:  TEE 1851 kcals/d (Ve 7.4, Tmax 37.4) Using current wt (97kg) edema noted (re-intubated)  Protein:  (1.5-2.0 g/kg)145-194 g/d but likely closer to 2.0-2.5 g/kg Using current wt of 97kg  Fluid:  + UOP  EDUCATION NEEDS:   No education needs identified at this time  MODERATE Care Level  Lyden Redner B. Freida Busman, RD, LDN [phone] (pager)

## 2015-10-10 NOTE — Progress Notes (Signed)
OT Cancellation Note  Patient Details Name: Courtney [name] MRN: [id number] DOB: 1966/05/26   Cancelled Treatment:     Received order from Dr Belia HemanKasa to discontinue OT evaluation.  Order acknowledged and pt is back on ventilator.  Please re-consult when pt is appropriate and medically stable for participation in OT.  Thank you.  [name],Courtney    Susanne BordersSusan [name], OTR/L ascom  [phone] 10/10/2015, 10:00 AM

## 2015-10-10 NOTE — Progress Notes (Signed)
Saturation 100%. Respirations noted 26 HR 98. Breathing is labored at this time. Patient was able to shake her head yes or no to my questions.  I asked her if she was feeling worse than earlier. She did indicate yes. I asked her if she wanted to go back on vent. She indicated no. Since her breathing is labored at this time, made the decision to put her back on by which she did eventually shake her in head that she agreed to such. Suctioned trach for small amount of secretions. Started out in pressure support mode only to find breathing remained labored therefore settled with volume and rate which patient immediately closed her eyes and calmed. Will continue to monitor. See flowsheet for vent documentation. Followed up with RN

## 2015-10-10 NOTE — Progress Notes (Signed)
[facility] [place] [facility] Progess Note  Name: Courtney GrinderHedda McMillian [name] MRN: [id number] DOB: 08-24-1966    ADMISSION DATE:  2015-10-11     INITIAL PRESENTATION:  49 AAF who has been in medical facilities (hosp, LTAC, rehab) for 2 yrs following gastric bypass surgery with multiple complications. Now with chronic trach, ESRD, profound debilitation, severe sacral pressure ulcer. Was seemingly making progress and transferred to rehab facility approx one week prior to this admission. [facility] with A[facility] and hypotension. Working dx of severe sepsis/septic shock due to infected sacral pressure ulcer. Since admission. Her course is been very complicated with numerous complications including septic shock and GI bleeding.    INTERVAL HISTORY:  The patient is off the ventilator this morning, she is back on trach collar. She is awake and alert and has no particular complaints. The patient's husband is in the room. He was updated on the patient's care. Our notes, the patient continued to have diarrhea overnight, patient has concerns about the patient's diarrhea and his tube feeds. When necessary asked me about potentially placing a rectal tube.   [name] changed out due to cuff leak on  11/5 PM, #6 Shiley cuffed,  The patient was started on Megace and thiamine, on 09/22/2015 per the husband's request.  11/7>>husband at bedside updated on current plan. Plan for HD today, cont with TF, cont with daptomycin, awaiting bone cx and s/s from trach aspirate. Pressure in pilot balloon of trach in the 50s, if cont to be this high, might need to upsize trach.    11/8> no acute events overnight, mild thin secretions noted, was on the vent at 35% FiO2 during the night. Creatinine slightly worse compared to yesterday; had dialysis yesterday, 1 L removed. Sacral Bone culture shows Klebsiella pneumonia and Escherichia coli  11/9> placed on ventilator rate last night, today tolerating dialysis, goal  removal is 1 L, we'll attempt trach collar trials today, patient more interactive, will open eyes and nod. Patient is currently on Septra and daptomycin.  11/10>tolerated about 8 hrs off TCT yesterday, placed back on vent overnight, somnolent this morning but more alert as the day progresses.  HD with 1 liter removed yesterday, BP stable, not requiring pressors. RT report inc secretion, will obtain CXR and labs today.  11/14 currently on TCT, complains of back pain   MAJOR EVENTS/TEST RESULTS: Admission 02/07/14-05/07/14 Admission 07/21/14-09/06/14 Discharged to [facility]. Pt had palliative consult at that time, were asked to sign off by husband.  09/23 CT head: NAD 09/23 EEG: no epileptiform activity 09/23 PRBCs for Hgb 6.4 09/24 bedside debridement of sacral wound. Abscess drained 09/25 Off vasopressors. More alert. No distress. Worsening thrombocytopenia. Vanc DC'd 09/29  Dr. [name] (I.D) excused from the case by patient's husband. 10/02 MRI -multiple infarcts 10/03 tracheal bleeding- transfused platelets 10/03 hospitalist service excused from the case by patient's husband 10/03 Echocardiogram ejection fraction was 55-60%, pulmonary systolic pressure was 39 mmHg 40/9810/08 restart TF's at lower rate, attempt reg HD  10/12 Transferred to med-surg floor. Remains on PCCM service 10/14 SLP eval: pt unable to tolerate PMV adequately 10/17-will re-attempt PM valve-discussed with Speech therapist 10/18 passed swallow eval-start pureed thick foods no thin liquids-continue NG feeds 10/19 transferred to step down for sepsis/aspiration pneumonia 10/19 cxr shows RLL opacity 10/21 sacral decub debride at bedside by surgery 10/22 started back on vasopressors while on HD 10/27 CT with osteo, R hip fx, unable to identify tip of dubhoff tube - sent for fluoro  study 10/28 Ortho consultation: I do not feel that she is a surgical candidate. Therefore, I feel that it would best to manage this fracture  nonsurgically and allow it to heal by itself over time, which it should.  10/28 Gen Surg consultation: Due to the lack of free air or free flow of contrast and the peritoneum there is no indication for any surgical intervention on this. Would recommend pulling back the feeding tube 1-2 cm. Would recheck an abdominal film to confirm no pneumoperitoneum in the morning. Absence of any changes okay to continue using Dobbhoff for feeding and medications. 10/28 gastrograffin study: The study confirms that the feeding tube pes perforated through the duodenum and the tip is within a cavity that fills with injected contrast. 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 Pneumoniae, P. Mirabilis, VRE Wound 9/26 >> No growth Blood 9/23 >> NEG CDiff 9/27>>neg Stool Cx 10/15>> negative Cdiff 10/25>>neg Trach Aspirate 10/28>> light growth pseudomonas Blood 10/28 >> 1/2 GPC >>  Sputum cultures obtained 09/22/15 due to mucus plugging>>Pseudomonas BONE TISSUE Cx 11/3>>E.coli (ESBL), k. Pneumonia (daptomycin and septra)  ANTIMICROBIALS:  Aztreonam 9/23 >> 9/24 Vanc 9/23 >> 9/25 Vanc 9/26>>9/27 Daptomycin 9/27>> 10/12, 10/28>>  Meropenem 9/23 >>  10/14, 10/28 >> 10/31 Septra 11/8>>   VITAL SIGNS: Temp:  [96.7 F (35.9 C)-99.3 F (37.4 C)] 96.7 F (35.9 C) (11/21 1410) Pulse Rate:  [85-101] 85 (11/21 1835) Resp:  [12-26] 14 (11/21 1835) BP: (78-123)/(35-95) 117/41 mmHg (11/21 1830) SpO2:  [97 %-100 %] 100 % (11/21 0600) FiO2 (%):  [30 %] 30 % (11/21 1453) HEMODYNAMICS:   VENTILATOR SETTINGS: Vent Mode:  [-] PRVC FiO2 (%):  [30 %] 30 % Set Rate:  [15 bmp] 15 bmp Vt Set:  [500 mL] 500 mL PEEP:  [5 cmH20] 5 cmH20 Plateau Pressure:  [15 cmH20] 15 cmH20 INTAKE / OUTPUT:  Intake/Output Summary (Last 24 hours) at 10/10/15 1927 Last data filed at 10/10/15 1835  Gross per 24 hour  Intake      0 ml  Output    590 ml  Net   -590 ml    PHYSICAL EXAMINATION: Physical Examination:   VS: BP 117/41 mmHg  Pulse 85  Temp(Src) 96.7 F (35.9 C) (Axillary)  Resp 14  Ht 5\' 9"  (1.753 m)  Wt 97 kg (213 lb 13.5 oz)  BMI 31.57 kg/m2  SpO2 100%  LMP  (LMP Unknown)  General Appearance: No resp distress, coarse upper airway sounds, off vent, on TCT Neuro: profoundly diffusely weak, alert today, nodding head HEENT: cushingoid facies, PERRLA, EOM intact Neck: trach site clean Pulmonary: clear anteriorly Cardiovascular: reg, + syst murmur Abdomen: soft, NT, +BS Extremities: warm, R>L UE edema Examination of the sacral decubitus ulcer- showed a very deep ulcer approximately 7-8 cm across.  stage IV ulcer.   LABORATORY PANEL:   CBC  Recent Labs Lab 10/10/15 0613  WBC 7.7  HGB 8.4*  HCT 26.6*  PLT 195    Chemistries   Recent Labs Lab 10/04/15 0900 10/07/15 1600  10/10/15 0613  NA 140 141  < > 141  K 3.9 4.3  < > 5.2*  CL 102 100*  < > 99*  CO2 33* 34*  < > 31  GLUCOSE 107* 146*  < > 81  BUN 62* 64*  < > 100*  CREATININE 1.08* 0.98  < > 1.63*  CALCIUM 8.2* 8.4*  < > 8.5*  MG 1.8  --   --   --   PHOS 3.4 3.4  --   --   < > = values in this interval not displayed.   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 BMET intermittently Monitor I/Os Correct electrolytes as indicated Intermittent HD per Renal Service (MWF-dialysis today) CXR today,   GASTROINTESTINAL A:  LGIB, resolved Hx of C. Diff Stool incontinence  P:  Cont PPI Continued watery diarrhea, discussed with the care nurse and the husband at the bedside regarding possible options including placement of a rectal tube. Given her recent GI bleeding which was life-threatening due to rectal ulcer. I would not place a rectal tube at this time. Patient is currently on low-dose aspirin, for stroke prophylaxis at the husband's request. Our lasted a rectal pouch, which would not be inserted in the patient's body. I think this would be appropriate, and strike and appropriate balance between preventing contamination of her wound, and that increasing her risk of a bleed. In addition, I will put the tube feedings on hold to try to control the diarrhea better.  HEMATOLOGIC A:  Acute on chronic anemia S/p Thrombocytopenia - HIT panel negative 9/25, resolved P: DVT px: SCDs + SQ heparin Monitor CBC intermittently Transfuse per usual ICU guidelines  INFECTIOUS A:  Recurrent Severe sepsis, due to sacral decub, currently stable.  Sacral decubitus ulcer with abscess - on Septra  ID following along as needed, appreciate recs - currently on septra following bone biopsy. P:  Monitor temp, WBC count Micro and abx as above -follow ID recs  ENDOCRINE A:  -DM 2, controlled.  -Chronic steroid therapy, for a history of of adrenal insufficiency. P:  Monitor CBGs q 8 hrs -  resume SSI if > 180 Cont hydrocortisone @ 10 mg BID Continue  tube feeds. Monitor blood glucose levels.  MSK A: Right hip fx - small intertrochanteric R fx - pain management. Ortho evaluated and felt not a surgical candidate at this time.   NEUROLOGIC-critical illness polyneuropathy A:  Acute encephalopathy, now with chronic metabolic encephalopathy. The patient appears to have waxing and waning mental status, she was evaluated by both the psychiatry and neurology services, and it was deemed that the patient lacks decisional capacity at this time. 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

## 2015-10-10 NOTE — Progress Notes (Signed)
Subjective:  Had to be placed back on ventilator last night BP is low this morning Continues to have significant edema    Objective:  Vital signs in last 24 hours:  Temp:  [98.5 F (36.9 C)-99.3 F (37.4 C)] 99.3 F (37.4 C) (11/21 0400) Pulse Rate:  [92-114] 92 (11/21 0600) Resp:  [16-31] 16 (11/21 0600) BP: (112-134)/(54-104) 112/59 mmHg (11/21 0600) SpO2:  [92 %-100 %] 100 % (11/21 0600) FiO2 (%):  [30 %-40 %] 30 % (11/21 0756)  Weight change:  Filed Weights   10/04/15 1230 10/05/15 0400 10/06/15 0429  Weight: 97 kg (213 lb 13.5 oz) 100 kg (220 lb 7.4 oz) 97 kg (213 lb 13.5 oz)    Intake/Output: I/O last 3 completed shifts: In: 1455.8 [I.V.:20; NG/GT:1435.8] Out: -    Intake/Output this shift:     Physical Exam: General: No acute distress, chronically ill appearing  Head: NG in place, OM moist  Eyes: anicteric  Neck: Tracheostomy in place, vent assisted  Lungs:  Scattered rhonchi, normal effort  Heart: S1S2 +tachycardia  Abdomen:  Soft, nontender, BS present  Extremities: + dependent edema, anasarca  Neurologic: Lethargic but arousable  Access:  R IJ permcath.       Basic Metabolic Panel:  Recent Labs Lab 10/04/15 0900 10/07/15 1600 10/09/15 0506 10/10/15 0613  NA 140 141 139 141  K 3.9 4.3 4.7 5.2*  CL 102 100* 100* 99*  CO2 33* 34* 32 31  GLUCOSE 107* 146* 123* 81  BUN 62* 64* 91* 100*  CREATININE 1.08* 0.98 1.35* 1.63*  CALCIUM 8.2* 8.4* 8.4* 8.5*  MG 1.8  --   --   --   PHOS 3.4 3.4  --   --     Liver Function Tests:  Recent Labs Lab 10/07/15 1600  ALBUMIN 2.6*   No results for input(s): LIPASE, AMYLASE in the last 168 hours. No results for input(s): AMMONIA in the last 168 hours.  CBC:  Recent Labs Lab 10/04/15 0900 10/07/15 1600 10/09/15 0506 10/10/15 0613  WBC 12.6* 11.7* 9.1 7.7  HGB 9.1* 8.8* 8.0* 8.4*  HCT 29.6* 28.5* 25.7* 26.6*  MCV 102.7* 103.4* 102.7* 101.3*  PLT 205 201 191 195    Cardiac Enzymes: No  results for input(s): CKTOTAL, CKMB, CKMBINDEX, TROPONINI in the last 168 hours.  BNP: Invalid input(s): POCBNP  CBG:  Recent Labs Lab 10/09/15 1642 10/09/15 2012 10/10/15 0018 10/10/15 0427 10/10/15 0725  GLUCAP 117* 154* 91 76 68    Microbiology: Results for orders placed or performed during the hospital encounter of 08/18/2015  Blood Culture (routine x 2)     Status: None   Collection Time: 08/11/2015  8:51 AM  Result Value Ref Range Status   Specimen Description BLOOD [name]  Final   Special Requests BOTTLES DRAWN AEROBIC AND ANAEROBIC  3CC  Final   Culture NO GROWTH 5 DAYS  Final   Report Status 08/17/2015 FINAL  Final  Blood Culture (routine x 2)     Status: None   Collection Time: 07/31/2015  9:20 AM  Result Value Ref Range Status   Specimen Description BLOOD LEFT ARM  Final   Special Requests BOTTLES DRAWN AEROBIC AND ANAEROBIC  1CC  Final   Culture NO GROWTH 5 DAYS  Final   Report Status 08/17/2015 FINAL  Final  Wound culture     Status: None   Collection Time: 07/24/2015  9:20 AM  Result Value Ref Range Status   Specimen Description DECUBITIS  Final   Special Requests Normal  Final   Gram Stain   Final    FEW WBC SEEN MANY GRAM NEGATIVE RODS RARE GRAM POSITIVE COCCI    Culture   Final    HEAVY GROWTH ESCHERICHIA COLI MODERATE GROWTH ENTEROBACTER AEROGENES PROTEUS MIRABILIS HEAVY GROWTH ENTEROCOCCUS SPECIES VRE HAVE INTRINSIC RESISTANCE TO MOST COMMONLY USED ANTIBIOTICS AND THE ABILITY TO ACQUIRE RESISTANCE TO MOST AVAILABLE ANTIBIOTICS.    Report Status 08/16/2015 FINAL  Final   Organism ID, Bacteria ESCHERICHIA COLI  Final   Organism ID, Bacteria ENTEROBACTER AEROGENES  Final   Organism ID, Bacteria PROTEUS MIRABILIS  Final   Organism ID, Bacteria ENTEROCOCCUS SPECIES  Final      Susceptibility   Enterobacter aerogenes - MIC*    CEFTAZIDIME <=1 SENSITIVE Sensitive     CEFAZOLIN >=64 RESISTANT Resistant     CEFTRIAXONE <=1 SENSITIVE Sensitive      CIPROFLOXACIN <=0.25 SENSITIVE Sensitive     GENTAMICIN <=1 SENSITIVE Sensitive     IMIPENEM 1 SENSITIVE Sensitive     TRIMETH/SULFA <=20 SENSITIVE Sensitive     * MODERATE GROWTH ENTEROBACTER AEROGENES   Escherichia coli - MIC*    AMPICILLIN <=2 SENSITIVE Sensitive     CEFTAZIDIME <=1 SENSITIVE Sensitive     CEFAZOLIN <=4 SENSITIVE Sensitive     CEFTRIAXONE <=1 SENSITIVE Sensitive     CIPROFLOXACIN <=0.25 SENSITIVE Sensitive     GENTAMICIN <=1 SENSITIVE Sensitive     IMIPENEM <=0.25 SENSITIVE Sensitive     TRIMETH/SULFA <=20 SENSITIVE Sensitive     * HEAVY GROWTH ESCHERICHIA COLI   Proteus mirabilis - MIC*    AMPICILLIN >=32 RESISTANT Resistant     CEFTAZIDIME <=1 SENSITIVE Sensitive     CEFAZOLIN 8 SENSITIVE Sensitive     CEFTRIAXONE <=1 SENSITIVE Sensitive     CIPROFLOXACIN <=0.25 SENSITIVE Sensitive     GENTAMICIN <=1 SENSITIVE Sensitive     IMIPENEM 1 SENSITIVE Sensitive     TRIMETH/SULFA <=20 SENSITIVE Sensitive     * PROTEUS MIRABILIS   Enterococcus species - MIC*    AMPICILLIN >=32 RESISTANT Resistant     VANCOMYCIN >=32 RESISTANT Resistant     GENTAMICIN SYNERGY SENSITIVE Sensitive     TETRACYCLINE Value in next row Resistant      RESISTANT>=16    * HEAVY GROWTH ENTEROCOCCUS SPECIES  MRSA PCR Screening     Status: None   Collection Time: 08-23-15  2:38 PM  Result Value Ref Range Status   MRSA by PCR NEGATIVE NEGATIVE Final    Comment:        The GeneXpert MRSA Assay (FDA approved for NASAL specimens only), is one component of a comprehensive MRSA colonization surveillance program. It is not intended to diagnose MRSA infection nor to guide or monitor treatment for MRSA infections.   Blood culture (single)     Status: None   Collection Time: 08-23-15  3:36 PM  Result Value Ref Range Status   Specimen Description BLOOD RIGHT ASSIST CONTROL  Final   Special Requests BOTTLES DRAWN AEROBIC AND ANAEROBIC  Final   Culture NO GROWTH 5 DAYS  Final   Report  Status 08/17/2015 FINAL  Final  Wound culture     Status: None   Collection Time: 08/13/15 12:37 PM  Result Value Ref Range Status   Specimen Description WOUND  Final   Special Requests Normal  Final   Gram Stain   Final    FEW WBC SEEN TOO NUMEROUS TO COUNT GRAM  NEGATIVE RODS FEW GRAM POSITIVE COCCI    Culture   Final    HEAVY GROWTH ESCHERICHIA COLI MODERATE GROWTH PROTEUS MIRABILIS LIGHT GROWTH KLEBSIELLA PNEUMONIAE MODERATE GROWTH ENTEROCOCCUS GALLINARUM CRITICAL RESULT CALLED TO, READ BACK BY AND VERIFIED WITH: [facility] BORBA AT 1042 08/16/15 DV    Report Status 08/17/2015 FINAL  Final   Organism ID, Bacteria ESCHERICHIA COLI  Final   Organism ID, Bacteria PROTEUS MIRABILIS  Final   Organism ID, Bacteria KLEBSIELLA PNEUMONIAE  Final   Organism ID, Bacteria ENTEROCOCCUS GALLINARUM  Final      Susceptibility   Escherichia coli - MIC*    AMPICILLIN >=32 RESISTANT Resistant     CEFTAZIDIME 4 RESISTANT Resistant     CEFAZOLIN >=64 RESISTANT Resistant     CEFTRIAXONE 16 RESISTANT Resistant     GENTAMICIN 2 SENSITIVE Sensitive     IMIPENEM >=16 RESISTANT Resistant     TRIMETH/SULFA <=20 SENSITIVE Sensitive     Extended ESBL POSITIVE Resistant     PIP/TAZO Value in next row Resistant      RESISTANT>=128    CIPROFLOXACIN Value in next row Sensitive      SENSITIVE<=0.25    * HEAVY GROWTH ESCHERICHIA COLI   Klebsiella pneumoniae - MIC*    AMPICILLIN Value in next row Resistant      SENSITIVE<=0.25    CEFTAZIDIME Value in next row Resistant      SENSITIVE<=0.25    CEFAZOLIN Value in next row Resistant      SENSITIVE<=0.25    CEFTRIAXONE Value in next row Resistant      SENSITIVE<=0.25    CIPROFLOXACIN Value in next row Resistant      SENSITIVE<=0.25    GENTAMICIN Value in next row Sensitive      SENSITIVE<=0.25    IMIPENEM Value in next row Resistant      SENSITIVE<=0.25    TRIMETH/SULFA Value in next row Resistant      SENSITIVE<=0.25    PIP/TAZO Value in next row  Resistant      RESISTANT>=128    * LIGHT GROWTH KLEBSIELLA PNEUMONIAE   Proteus mirabilis - MIC*    AMPICILLIN Value in next row Resistant      RESISTANT>=128    CEFTAZIDIME Value in next row Sensitive      RESISTANT>=128    CEFAZOLIN Value in next row Sensitive      RESISTANT>=128    CEFTRIAXONE Value in next row Sensitive      RESISTANT>=128    CIPROFLOXACIN Value in next row Sensitive      RESISTANT>=128    GENTAMICIN Value in next row Sensitive      RESISTANT>=128    IMIPENEM Value in next row Sensitive      RESISTANT>=128    TRIMETH/SULFA Value in next row Sensitive      RESISTANT>=128    PIP/TAZO Value in next row Sensitive      SENSITIVE<=4    * MODERATE GROWTH PROTEUS MIRABILIS   Enterococcus gallinarum - MIC*    AMPICILLIN Value in next row Resistant      SENSITIVE<=4    GENTAMICIN SYNERGY Value in next row Sensitive      SENSITIVE<=4    CIPROFLOXACIN Value in next row Resistant      RESISTANT>=8    TETRACYCLINE Value in next row Resistant      RESISTANT>=16    * MODERATE GROWTH ENTEROCOCCUS GALLINARUM  Wound culture     Status: None   Collection Time: 08/15/15  2:53 PM  Result  Value Ref Range Status   Specimen Description WOUND  Final   Special Requests NONE  Final   Gram Stain FEW WBC SEEN NO ORGANISMS SEEN   Final   Culture NO GROWTH 3 DAYS  Final   Report Status 08/18/2015 FINAL  Final  C difficile quick scan w PCR reflex     Status: None   Collection Time: 08/17/15 11:34 AM  Result Value Ref Range Status   C Diff antigen NEGATIVE NEGATIVE Final   C Diff toxin NEGATIVE NEGATIVE Final   C Diff interpretation Negative for C. difficile  Final  Stool culture     Status: None   Collection Time: 09/03/15  3:51 PM  Result Value Ref Range Status   Specimen Description STOOL  Final   Special Requests Immunocompromised  Final   Culture   Final    NO SALMONELLA OR SHIGELLA ISOLATED No Pathogenic E. coli detected NO CAMPYLOBACTER DETECTED    Report Status  09/07/2015 FINAL  Final  C difficile quick scan w PCR reflex     Status: None   Collection Time: 09/13/15 12:51 AM  Result Value Ref Range Status   C Diff antigen NEGATIVE NEGATIVE Final   C Diff toxin NEGATIVE NEGATIVE Final   C Diff interpretation Negative for C. difficile  Final  Culture, blood (routine x 2)     Status: None   Collection Time: 09/16/15 11:24 AM  Result Value Ref Range Status   Specimen Description BLOOD LEFT HAND  Final   Special Requests BOTTLES DRAWN AEROBIC AND ANAEROBIC  1CC  Final   Culture NO GROWTH 5 DAYS  Final   Report Status 09/21/2015 FINAL  Final  Culture, blood (routine x 2)     Status: None   Collection Time: 09/16/15 12:23 PM  Result Value Ref Range Status   Specimen Description BLOOD RIGHT HAND  Final   Special Requests BOTTLES DRAWN AEROBIC AND ANAEROBIC  1CC  Final   Culture  Setup Time   Final    GRAM POSITIVE COCCI AEROBIC BOTTLE ONLY CRITICAL RESULT CALLED TO, READ BACK BY AND VERIFIED WITH: TESS THOMAS,RN 09/17/2015 0631 BY JRS.    Culture   Final    ENTEROCOCCUS FAECALIS AEROBIC BOTTLE ONLY VRE HAVE INTRINSIC RESISTANCE TO MOST COMMONLY USED ANTIBIOTICS AND THE ABILITY TO ACQUIRE RESISTANCE TO MOST AVAILABLE ANTIBIOTICS. CRITICAL RESULT CALLED TO, READ BACK BY AND VERIFIED WITH: CHERYL SMITH AT 9147 09/19/15 DV    Report Status 09/21/2015 FINAL  Final   Organism ID, Bacteria ENTEROCOCCUS FAECALIS  Final      Susceptibility   Enterococcus faecalis - MIC*    AMPICILLIN <=2 SENSITIVE Sensitive     LINEZOLID 2 SENSITIVE Sensitive     CIPROFLOXACIN Value in next row Resistant      RESISTANT>=8    TETRACYCLINE Value in next row Resistant      RESISTANT>=16    VANCOMYCIN Value in next row Resistant      RESISTANT>=32    GENTAMICIN SYNERGY Value in next row Resistant      RESISTANT>=32    * ENTEROCOCCUS FAECALIS  Culture, respiratory (NON-Expectorated)     Status: None   Collection Time: 09/16/15  3:50 PM  Result Value Ref Range Status    Specimen Description TRACHEAL ASPIRATE  Final   Special Requests Immunocompromised  Final   Gram Stain   Final    FEW WBC SEEN GOOD SPECIMEN - 80-90% WBCS RARE GRAM NEGATIVE RODS    Culture LIGHT  GROWTH PSEUDOMONAS AERUGINOSA  Final   Report Status 09/19/2015 FINAL  Final   Organism ID, Bacteria PSEUDOMONAS AERUGINOSA  Final      Susceptibility   Pseudomonas aeruginosa - MIC*    CEFTAZIDIME 8 SENSITIVE Sensitive     CIPROFLOXACIN 2 INTERMEDIATE Intermediate     GENTAMICIN >=16 RESISTANT Resistant     IMIPENEM >=16 RESISTANT Resistant     PIP/TAZO Value in next row Sensitive      SENSITIVE32    CEFEPIME Value in next row Sensitive      SENSITIVE8    LEVOFLOXACIN Value in next row Resistant      RESISTANT>=8    * LIGHT GROWTH PSEUDOMONAS AERUGINOSA  Culture, expectorated sputum-assessment     Status: None   Collection Time: 09/22/15  2:09 PM  Result Value Ref Range Status   Specimen Description ENDOTRACHEAL  Final   Special Requests Normal  Final   Sputum evaluation THIS SPECIMEN IS ACCEPTABLE FOR SPUTUM CULTURE  Final   Report Status 09/24/2015 FINAL  Final  Culture, respiratory (NON-Expectorated)     Status: None   Collection Time: 09/22/15  2:09 PM  Result Value Ref Range Status   Specimen Description ENDOTRACHEAL  Final   Special Requests Normal Reflexed from [id number]  Final   Gram Stain   Final    FEW WBC SEEN FEW GRAM NEGATIVE RODS POOR SPECIMEN - LESS THAN 70% WBCS    Culture   Final    MODERATE GROWTH PSEUDOMONAS AERUGINOSA LIGHT GROWTH KLEBSIELLA PNEUMONIAE REFER TO SENSITIVITIES FROM PREVIOUS CULTURE FOR ORG 2    Report Status 10/01/2015 FINAL  Final   Organism ID, Bacteria PSEUDOMONAS AERUGINOSA  Final   Organism ID, Bacteria KLEBSIELLA PNEUMONIAE  Final      Susceptibility   Pseudomonas aeruginosa - MIC*    CEFTAZIDIME 4 SENSITIVE Sensitive     CIPROFLOXACIN 2 INTERMEDIATE Intermediate     GENTAMICIN >=16 RESISTANT Resistant     IMIPENEM >=16 RESISTANT  Resistant     PIP/TAZO Value in next row Sensitive      SENSITIVE16    * MODERATE GROWTH PSEUDOMONAS AERUGINOSA  Tissue culture     Status: None   Collection Time: 09/24/15  6:44 AM  Result Value Ref Range Status   Specimen Description BONE  Final   Special Requests Normal  Final   Gram Stain MODERATE WBC SEEN FEW GRAM NEGATIVE RODS   Final   Culture   Final    MODERATE GROWTH ESCHERICHIA COLI MODERATE GROWTH KLEBSIELLA PNEUMONIAE ESBL-EXTENDED SPECTRUM BETA LACTAMASE-THE ORGANISM IS RESISTANT TO PENICILLINS, CEPHALOSPORINS AND AZTREONAM ACCORDING TO CLSI M100-S15 VOL.25 N01 JAN 2005. ORGANISM 1 This organism isolate is resistant to one or more antiotic agents in three or more antimicrobial categories.  Suggest Infectious Disease consult.   ORGANISM 2 CRITICAL RESULT CALLED TO, READ BACK BY AND VERIFIED WITH: RN Mardene CelesteJOANNA LINDSAY 09/27/15 1005AM    Report Status 09/28/2015 FINAL  Final   Organism ID, Bacteria ESCHERICHIA COLI  Final   Organism ID, Bacteria KLEBSIELLA PNEUMONIAE  Final      Susceptibility   Escherichia coli - MIC*    AMPICILLIN >=32 RESISTANT Resistant     CEFTAZIDIME 4 RESISTANT Resistant     CEFAZOLIN >=64 RESISTANT Resistant     CEFTRIAXONE 8 RESISTANT Resistant     GENTAMICIN <=1 SENSITIVE Sensitive     IMIPENEM 8 RESISTANT Resistant     TRIMETH/SULFA <=20 SENSITIVE Sensitive     Extended ESBL POSITIVE Resistant  PIP/TAZO Value in next row Resistant      RESISTANT>=128    * MODERATE GROWTH ESCHERICHIA COLI   Klebsiella pneumoniae - MIC*    AMPICILLIN Value in next row Resistant      RESISTANT>=128    CEFTAZIDIME Value in next row Resistant      RESISTANT>=128    CEFAZOLIN Value in next row Resistant      RESISTANT>=128    CEFTRIAXONE Value in next row Resistant      RESISTANT>=128    CIPROFLOXACIN Value in next row Resistant      RESISTANT>=128    GENTAMICIN Value in next row Resistant      RESISTANT>=128    IMIPENEM Value in next row Resistant       RESISTANT>=128    TRIMETH/SULFA Value in next row Sensitive      RESISTANT>=128    PIP/TAZO Value in next row Resistant      RESISTANT>=128    * MODERATE GROWTH KLEBSIELLA PNEUMONIAE  Anaerobic culture     Status: None   Collection Time: 09/24/15  3:55 PM  Result Value Ref Range Status   Specimen Description BONE  Final   Special Requests Normal  Final   Culture NO ANAEROBES ISOLATED  Final   Report Status 09/29/2015 FINAL  Final  C difficile quick scan w PCR reflex     Status: None   Collection Time: 10/07/15  2:02 PM  Result Value Ref Range Status   C Diff antigen NEGATIVE NEGATIVE Final   C Diff toxin NEGATIVE NEGATIVE Final   C Diff interpretation Negative for C. difficile  Final    Coagulation Studies: No results for input(s): LABPROT, INR in the last 72 hours.  Urinalysis: No results for input(s): COLORURINE, LABSPEC, PHURINE, GLUCOSEU, HGBUR, BILIRUBINUR, KETONESUR, PROTEINUR, UROBILINOGEN, NITRITE, LEUKOCYTESUR in the last 72 hours.  Invalid input(s): APPERANCEUR    Imaging: Dg Chest 1 View  10/09/2015  CLINICAL DATA:  Patient with respiratory failure. EXAM: CHEST 1 VIEW COMPARISON:  Chest radiograph 10/03/2015. FINDINGS: Central venous catheter tip projects over the right atrium. Tracheostomy tube tip projects over mid trachea. Left IJ central venous catheter tip projects over the superior vena cava. Enteric tube courses inferior to the diaphragm. Stable cardiomegaly. Low lung volumes. Unchanged heterogeneous opacities left greater than right lung bases. Small bilateral pleural effusions. No pneumothorax. IMPRESSION: Stable support apparatus. Cardiomegaly. Small bilateral pleural effusions. Heterogeneous opacities left lung base favored to represent atelectasis. Electronically Signed   By: Annia Belt M.D.   On: 10/09/2015 09:23     Medications:   . norepinephrine Stopped (10/08/15 0530)   . albumin human  25 g Intravenous Once  . amantadine  100 mg Per Tube BID  .  antiseptic oral rinse  7 mL Mouth Rinse q12n4p  . aspirin  81 mg Oral Daily  . chlorhexidine  15 mL Mouth Rinse BID  . epoetin (EPOGEN/PROCRIT) injection  10,000 Units Intravenous Q M,W,F-HD  . feeding supplement (PRO-STAT SUGAR FREE 64)  30 mL Per Tube 6 times per day  . folic acid  1 mg Oral Daily  . free water  20 mL Per Tube 6 times per day  . heparin subcutaneous  5,000 Units Subcutaneous Q12H  . hydrocortisone  10 mg Per Tube BID  . insulin aspart  0-9 Units Subcutaneous 6 times per day  . ipratropium-albuterol  3 mL Nebulization QID  . lidocaine  1 patch Transdermal Q24H  . loperamide  2 mg Oral Daily  .  megestrol  400 mg Oral BID  . midodrine  5 mg Per Tube TID WC  . multivitamin  5 mL Oral Daily  . pantoprazole sodium  40 mg Per Tube Daily  . sodium chloride  10-40 mL Intracatheter Q12H  . sodium hypochlorite   Irrigation BID  . sulfamethoxazole-trimethoprim  2.5 tablet Per Tube q1800  . thiamine IV  100 mg Intravenous Daily   sodium chloride, sodium chloride, acetaminophen (TYLENOL) oral liquid 160 mg/5 mL, HYDROmorphone (DILAUDID) injection, morphine injection, ondansetron (ZOFRAN) IV, oxyCODONE, sodium chloride, zinc oxide  Assessment/ Plan:  49 y.o. 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 Admitted on 08/05/2015  1. End-stage renal disease on hemodialysis on HD MWF. The patient has been on dialysis since  October of 2014. R IJ permcath. Dialysis treatment with IV albumin for oncotic support.  - BUN noted to be high - high risk of hypotension during dialysis  - slow BFR  2. Anemia of CKD: hgb down to 8.4, continue epogen with HD.  3. Secondary hyperparathyroidism: PTH low at 55 (9/25) -phos 3.4 at last check, no need for binders.   4. Sepsis: VRE in blood. Bone cultures E. Coli and klebsiella - Treatment as per ID and ICU team: daptomycin completed on 11/10 and now started on trimethoprim/sulfamethoxazole.   5. Generalized Dependent edema/Anasarca due to 3rd spacing - edema seems a bit more prominent, has had some difficulty tolerating UF at times,  - dialysis today  6. Acute resp failure - back on ventilator .  LOS: 59 Londynn Sonoda 11/21/20168:39 AM

## 2015-10-11 LAB — GLUCOSE, CAPILLARY
GLUCOSE-CAPILLARY: 120 mg/dL — AB (ref 65–99)
Glucose-Capillary: 97 mg/dL (ref 65–99)
Glucose-Capillary: 98 mg/dL (ref 65–99)
Glucose-Capillary: 131 mg/dL — ABNORMAL HIGH (ref 65–99)
Glucose-Capillary: 148 mg/dL — ABNORMAL HIGH (ref 65–99)

## 2015-10-11 MED ORDER — ANTISEPTIC ORAL RINSE SOLUTION (CORINZ)
7.0000 mL | Freq: Four times a day (QID) | OROMUCOSAL | Status: DC
  Administered 2015-10-12 – 2015-12-06 (×217): 7 mL via OROMUCOSAL
  Filled 2015-10-11 (×72): qty 7

## 2015-10-11 MED ORDER — AMANTADINE HCL 50 MG/5ML PO SYRP
200.0000 mg | ORAL_SOLUTION | ORAL | Status: DC
Start: 2015-10-17 — End: 2015-11-07
  Administered 2015-10-17 – 2015-11-07 (×4): 200 mg
  Filled 2015-10-11 (×4): qty 20

## 2015-10-11 MED ORDER — CHLORHEXIDINE GLUCONATE 0.12% ORAL RINSE (MEDLINE KIT)
15.0000 mL | Freq: Two times a day (BID) | OROMUCOSAL | Status: DC
Start: 2015-10-11 — End: 2015-12-06
  Administered 2015-10-11 – 2015-12-06 (×114): 15 mL via OROMUCOSAL
  Filled 2015-10-11 (×73): qty 15

## 2015-10-11 MED ORDER — AMANTADINE HCL 50 MG/5ML PO SYRP
100.0000 mg | ORAL_SOLUTION | Freq: Two times a day (BID) | ORAL | Status: DC
Start: 2015-10-11 — End: 2015-10-11
  Filled 2015-10-11 (×2): qty 10

## 2015-10-11 NOTE — Progress Notes (Signed)
Physical Therapy Treatment Patient Details Name: Courtney [name] MRN: [id number] DOB: 19-Nov-1966 Today's Date: 10/11/2015    History of Present Illness Pt is a [age] F who has been in medical facilities (hosp, LTAC, rehab) for 2 yrs following gastric bypass surgery with multiple complications. Now with chronic trach, ESRD, profound debilitation, severe sacral pressure ulcer (stage IV). Was seemingly making progress and transferred to rehab facility approx one week prior to this admission. She was [facility] ED [facility]23 with AMS and hypotension. Admitted with sepsis related to infected sacral ulcer.  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  Patient transferred to CCU to med-surg floor during hospitalization, but experienced profound interdialytic hypotension requiring return transfer to CCU for use of pressors (now off); remains in CCU.  Noted with R hip intertrochanteric fracture, recommended for conservative management per orthopedics.  Hospital course continues to be complicated with patient requiring return to vent 11/5 for respiratory decline with use of pressors (now off); extubated to trach collar 11/10; limited ability/willingness to follow commands or actively participate in therapeutic interventions.  Per care team, physicians recommending hospice care/comfort measures at this time; husband considering goals of care/POC pending discussion with patient and other family members, but wishes continued medical care at this time. Will carefully monitor medical plan and POC to evaluate appropriateness of continued PT throughout remaining hospitalization.    PT Comments    Patient with good alertness throughout session.  Intermittently shaking head "no" throughout session, esp when active assist/effort requested from therapist.  Visibly  emotional/labile at times. Significant facial grimacing noted this date with any attempts at L LE movement, appears to be L hip vs. Knee, but patient unable to communicate/describe further.  Unable to tolerate >10 degrees passive movement; additional attempts deferred secondary to obvious pain.  RN informed/aware.   Follow Up Recommendations  LTACH;SNF     Equipment Recommendations       Recommendations for Other Services       Precautions / Restrictions Precautions Precautions: Fall Precaution Comments: stage 4 sacral ulcer, NG tube/NPO, trach, contact isolation, R IJ permcath, L IJ central line, log roll only (as needed for dressing changes/hygiene) and no ROM to R LE/no attempts at sitting Restrictions Weight Bearing Restrictions: Yes RLE Weight Bearing: Non weight bearing Other Position/Activity Restrictions: No ROM R LE    Mobility  Bed Mobility                  Transfers                    Ambulation/Gait                 Stairs            Wheelchair Mobility    Modified Rankin (Stroke Patients Only)       Balance                                    Cognition Arousal/Alertness: Awake/alert                          Exercises Other Exercises Other Exercises: Passive ROM to bilat UEs, 1x12: gross grasp/release, wrist flex/ext, forearm pronation/supination, elbow flex/ext/abduct/adduct, shoulder IR/ER.  No active assist ROM noted this date;  patient, at times, pulling L UE away from therapist and shaking head "no" throughout session. Other Exercises: Attempted L LE passive ROM.  Patient with significant facial grimacing with minimal attempts at (less than 10 degrees) passive movement--appears to be at hip vs. knee, but patient unable to articulate/localize/describe.  Significant generalized, pitting edema throughout bilat LEs noted.  further attempts at L LE deferred secondary to obvious pain.  RN  informed/aware.    General Comments        Pertinent Vitals/Pain Pain Assessment: Faces Faces Pain Scale: Hurts whole lot Pain Location: L hip/knee? Pain Descriptors / Indicators: Grimacing Pain Intervention(s): Limited activity within patient's tolerance;Monitored during session;Repositioned    Home Living                      Prior Function            PT Goals (current goals can now be found in the care plan section) Acute Rehab PT Goals Patient Stated Goal: patient unable to verbalize/state goal Time For Goal Achievement: 10/20/15 Potential to Achieve Goals: Poor Progress towards PT goals: Progressing toward goals    Frequency  Min 2X/week    PT Plan Current plan remains appropriate    Co-evaluation             End of Session   Activity Tolerance: Patient limited by pain Patient left: in bed;with call bell/phone within reach     Time: 1146-1158 PT Time Calculation (min) (ACUTE ONLY): 12 min  Charges:  $Therapeutic Exercise: 8-22 mins                    G Codes:      Courtney [name] H. Manson Passey, PT, DPT, NCS 10/11/2015, 1:35 PM [phone]

## 2015-10-11 NOTE — Progress Notes (Addendum)
[facility] [place] [facility] Medicine Progess Note  Name: Courtney GrinderHedda McMillian [name] MRN: [id number] DOB: 08-24-1966    ADMISSION DATE:  2015-10-11     INITIAL PRESENTATION:  49 AAF who has been in medical facilities (hosp, LTAC, rehab) for 2 yrs following gastric bypass surgery with multiple complications. Now with chronic trach, ESRD, profound debilitation, severe sacral pressure ulcer. Was seemingly making progress and transferred to rehab facility approx one week prior to this admission. She was [facility] with AMS and hypotension. Working dx of severe sepsis/septic shock due to infected sacral pressure ulcer. Since admission. Her course is been very complicated with numerous complications including septic shock and GI bleeding.    INTERVAL HISTORY:  The patient is off the ventilator this morning, she is back on trach collar. She is awake and alert and has no particular complaints. The patient's husband is in the room. He was updated on the patient's care. Our notes, the patient continued to have diarrhea overnight, patient has concerns about the patient's diarrhea and his tube feeds. When necessary asked me about potentially placing a rectal tube.   [name] changed out due to cuff leak on  11/5 PM, #6 Shiley cuffed,  The patient was started on Megace and thiamine, on 09/22/2015 per the husband's request.  11/7>>husband at bedside updated on current plan. Plan for HD today, cont with TF, cont with daptomycin, awaiting bone cx and s/s from trach aspirate. Pressure in pilot balloon of trach in the 50s, if cont to be this high, might need to upsize trach.    11/8> no acute events overnight, mild thin secretions noted, was on the vent at 35% FiO2 during the night. Creatinine slightly worse compared to yesterday; had dialysis yesterday, 1 L removed. Sacral Bone culture shows Klebsiella pneumonia and Escherichia coli  11/9> placed on ventilator rate last night, today tolerating dialysis, goal  removal is 1 L, we'll attempt trach collar trials today, patient more interactive, will open eyes and nod. Patient is currently on Septra and daptomycin.  11/10>tolerated about 8 hrs off TCT yesterday, placed back on vent overnight, somnolent this morning but more alert as the day progresses.  HD with 1 liter removed yesterday, BP stable, not requiring pressors. RT report inc secretion, will obtain CXR and labs today.  11/14 currently on TCT, complains of back pain   MAJOR EVENTS/TEST RESULTS: Admission 02/07/14-05/07/14 Admission 07/21/14-09/06/14 Discharged to [facility]. Pt had palliative consult at that time, were asked to sign off by husband.  09/23 CT head: NAD 09/23 EEG: no epileptiform activity 09/23 PRBCs for Hgb 6.4 09/24 bedside debridement of sacral wound. Abscess drained 09/25 Off vasopressors. More alert. No distress. Worsening thrombocytopenia. Vanc DC'd 09/29  Dr. [name] (I.D) excused from the case by patient's husband. 10/02 MRI -multiple infarcts 10/03 tracheal bleeding- transfused platelets 10/03 hospitalist service excused from the case by patient's husband 10/03 Echocardiogram ejection fraction was 55-60%, pulmonary systolic pressure was 39 mmHg 40/9810/08 restart TF's at lower rate, attempt reg HD  10/12 Transferred to med-surg floor. Remains on PCCM service 10/14 SLP eval: pt unable to tolerate PMV adequately 10/17-will re-attempt PM valve-discussed with Speech therapist 10/18 passed swallow eval-start pureed thick foods no thin liquids-continue NG feeds 10/19 transferred to step down for sepsis/aspiration pneumonia 10/19 cxr shows RLL opacity 10/21 sacral decub debride at bedside by surgery 10/22 started back on vasopressors while on HD 10/27 CT with osteo, R hip fx, unable to identify tip of dubhoff tube - sent for fluoro  study 10/28 Ortho consultation: I do not feel that she is a surgical candidate. Therefore, I feel that it would best to manage this fracture  nonsurgically and allow it to heal by itself over time, which it should.  10/28 Gen Surg consultation: Due to the lack of free air or free flow of contrast and the peritoneum there is no indication for any surgical intervention on this. Would recommend pulling back the feeding tube 1-2 cm. Would recheck an abdominal film to confirm no pneumoperitoneum in the morning. Absence of any changes okay to continue using Dobbhoff for feeding and medications. 10/28 gastrograffin study: The study confirms that the feeding tube pes perforated through the duodenum and the tip is within a cavity that fills with injected contrast. 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 Pneumoniae, P. Mirabilis, VRE Wound 9/26 >> No growth Blood 9/23 >> NEG CDiff 9/27>>neg Stool Cx 10/15>> negative Cdiff 10/25>>neg Trach Aspirate 10/28>> light growth pseudomonas Blood 10/28 >> 1/2 GPC >>  Sputum cultures obtained 09/22/15 due to mucus plugging>>Pseudomonas BONE TISSUE Cx 11/3>>E.coli (ESBL), k. Pneumonia (daptomycin and septra)  ANTIMICROBIALS:  Aztreonam 9/23 >> 9/24 Vanc 9/23 >> 9/25 Vanc 9/26>>9/27 Daptomycin 9/27>> 10/12, 10/28>>  Meropenem 9/23 >>  10/14, 10/28 >> 10/31 Septra 11/8>>   VITAL SIGNS: Temp:  [97 F (36.1 C)-99.1 F (37.3 C)] 97.7 F (36.5 C) (11/22 2000) Pulse Rate:  [102-115] 106 (11/22 2100) Resp:  [15-26] 24 (11/22 2100) BP: (85-128)/(35-90) 126/65 mmHg (11/22 2100) SpO2:  [96 %-100 %] 99 % (11/22 2100) FiO2 (%):  [30 %] 30 % (11/22 2100) HEMODYNAMICS:   VENTILATOR SETTINGS: Vent Mode:  [-] PSV FiO2 (%):  [30 %] 30 % Set Rate:  [15 bmp] 15 bmp Vt Set:  [500 mL] 500 mL PEEP:  [5 cmH20] 5 cmH20 Pressure Support:  [10 cmH20] 10 cmH20 INTAKE / OUTPUT:  Intake/Output Summary (Last 24 hours) at 10/11/15 2122 Last data filed at 10/11/15 2100  Gross per 24 hour  Intake   1560 ml  Output      0 ml  Net   1560 ml    PHYSICAL EXAMINATION: Physical Examination:   VS: BP 126/65 mmHg  Pulse 106  Temp(Src) 97.7 F (36.5 C) (Axillary)  Resp 24  Ht 5\' 9"  (1.753 m)  Wt 97 kg (213 lb 13.5 oz)  BMI 31.57 kg/m2  SpO2 99%  LMP  (LMP Unknown)  General Appearance: No resp distress, coarse upper airway sounds, off vent, on TCT Neuro: profoundly diffusely weak, alert today, nodding head HEENT: cushingoid facies, PERRLA, EOM intact Neck: trach site clean Pulmonary: clear anteriorly Cardiovascular: reg, + syst murmur Abdomen: soft, NT, +BS Extremities: warm, R>L UE edema Examination of the sacral decubitus ulcer- showed a very deep ulcer approximately 7-8 cm across.  stage IV ulcer.   LABORATORY PANEL:   CBC  Recent Labs Lab 10/10/15 0613  WBC 7.7  HGB 8.4*  HCT 26.6*  PLT 195    Chemistries   Recent Labs Lab 10/07/15 1600  10/10/15 0613  NA 141  < > 141  K 4.3  < > 5.2*  CL 100*  < > 99*  CO2 34*  < > 31  GLUCOSE 146*  < > 81  BUN 64*  < > 100*  CREATININE 0.98  < > 1.63*  CALCIUM 8.4*  < > 8.5*  PHOS 3.4  --   --   < > = values in this interval not displayed.   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 Diff Stool incontinence - husband has refused to allow rectal tube placement Acute on chronic anemia Thrombocytopenia, resolved - HIT panel negative 9/25 Recurrent Severe sepsis, due to sacral pressure ulcer ID following  DM 2, controlled.  Chronic steroid therapy, for a history of of adrenal insufficiency Incidental finding of R hip fx - conservative mgmt. Waxing and waning encephalopathy - she was evaluated by both the psychiatry and neurology services, and it was deemed that the patient lacks decisional capacity at this time. 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 Husband declined TEE Profound deconditioning-criticall illness neuropathy Chronic pain  Cont vent support - settings reviewed and/or adjusted Wean in PSV <> ATC as tolerated Cont vent bundle Daily SBT if/when meets criteria Monitor BMET intermittently Monitor I/Os Correct electrolytes as indicated Intermittent HD per Renal service Cont TFs as tolerated Cont SUP Cont DVT px Cont TMP-SMX per ID service DVT px: SQ heparin Monitor CBC intermittently Transfuse per usual ICU guidelines Cont analgesics PRN Cont to work on [employer] placement  Billy Fischer, MD PCCM service Mobile [phone] Pager [phone]

## 2015-10-11 NOTE — Progress Notes (Signed)
Nutrition Follow-up  DOCUMENTATION CODES:   Severe malnutrition in context of acute illness/injury  INTERVENTION:  EN: continue vital 1.2 at 61ml/hr with prostat times 6 at this time to meet nutritional needs.  Coordination of care: Will follow- up weekly, participate daily in ICU rounds and make recommendations accordingly.    NUTRITION DIAGNOSIS:   Inadequate oral intake related to wound healing, acute illness, chronic illness as evidenced by NPO status, estimated needs.    GOAL:   Patient will meet greater than or equal to 90% of their needs    MONITOR:    (Energy Intake, Anthropometrics, Digestive System, Electrolyte/Renal Profile, Glucose Profile)  REASON FOR ASSESSMENT:   Consult Enteral/tube feeding initiation and management  ASSESSMENT:      Pt remains on vent at this time.  Planning HD on Wednesday   Current Nutrition: pt tolerating tube feeding at this time   Gastrointestinal Profile: Last BM: loose stool noted in last 24 hr 3 documented loose stool   Scheduled Medications:  . [START ON 10/17/2015] amantadine  200 mg Per Tube Q7 days  . antiseptic oral rinse  7 mL Mouth Rinse q12n4p  . aspirin  81 mg Oral Daily  . chlorhexidine  15 mL Mouth Rinse BID  . epoetin (EPOGEN/PROCRIT) injection  10,000 Units Intravenous Q M,W,F-HD  . feeding supplement (PRO-STAT SUGAR FREE 64)  30 mL Oral 6 X Daily  . folic acid  1 mg Oral Daily  . free water  20 mL Per Tube 6 times per day  . heparin subcutaneous  5,000 Units Subcutaneous Q12H  . hydrocortisone  10 mg Per Tube BID  . insulin aspart  0-9 Units Subcutaneous 6 times per day  . ipratropium-albuterol  3 mL Nebulization QID  . lidocaine  1 patch Transdermal Q24H  . loperamide  2 mg Oral Daily  . midodrine  5 mg Per Tube TID WC  . multivitamin  5 mL Oral Daily  . pantoprazole sodium  40 mg Per Tube Daily  . sodium chloride  10-40 mL Intracatheter Q12H  . sulfamethoxazole-trimethoprim  2.5 tablet Per Tube  q1800  . thiamine IV  100 mg Intravenous Daily    Continuous Medications:  . feeding supplement (VITAL AF 1.2 CAL) 1,000 mL (10/11/15 0400)  . norepinephrine Stopped (10/08/15 0530)     Electrolyte/Renal Profile and Glucose Profile:   Recent Labs Lab 10/07/15 1600 10/09/15 0506 10/10/15 0613  NA 141 139 141  K 4.3 4.7 5.2*  CL 100* 100* 99*  CO2 34* 32 31  BUN 64* 91* 100*  CREATININE 0.98 1.35* 1.63*  CALCIUM 8.4* 8.4* 8.5*  PHOS 3.4  --   --   GLUCOSE 146* 123* 81   Protein Profile:  Recent Labs Lab 10/07/15 1600  ALBUMIN 2.6*      Weight Trend since Admission: Filed Weights   10/04/15 1230 10/05/15 0400 10/06/15 0429  Weight: 213 lb 13.5 oz (97 kg) 220 lb 7.4 oz (100 kg) 213 lb 13.5 oz (97 kg)      Diet Order:   NPO  Skin:   (stage IV sacrum, stage III hip, unstageable foot; rectal ulcer from fleixseal, ulcer in nose from dobhoff)  Last BM:  +medium BM 10/19  Height:   Ht Readings from Last 1 Encounters:  08/17/2015  (1.753 m)    Weight:   Wt Readings from Last 1 Encounters:  10/06/15 213 lb 13.5 oz (97 kg)    Ideal Body Weight:  BMI:  Body mass index is 31.57 kg/(m^2).  Estimated Nutritional Needs:   Kcal:  TEE 1851 kcals/d (Ve 7.4, Tmax 37.4) Using current wt (97kg) edema noted (re-intubated)  Protein:  (1.5-2.0 g/kg)145-194 g/d but likely closer to 2.0-2.5 g/kg Using current wt of 97kg  Fluid:  1000ml + UOP  EDUCATION NEEDS:   No education needs identified at this time  LOW Care Level  Kamorah Nevils B. Freida BusmanAllen, RD, LDN [phone])

## 2015-10-11 NOTE — Progress Notes (Signed)
Subjective:  Remains critically ill on ventilator BP borderline low Continues to have significant edema    Objective:  Vital signs in last 24 hours:  Temp:  [96.1 F (35.6 C)-98.9 F (37.2 C)] 97.6 F (36.4 C) (11/22 0400) Pulse Rate:  [85-111] 109 (11/22 0900) Resp:  [12-22] 22 (11/22 0900) BP: (78-117)/(35-90) 107/59 mmHg (11/22 0900) SpO2:  [97 %-100 %] 99 % (11/22 0900) FiO2 (%):  [30 %] 30 % (11/22 0900)  Weight change:  Filed Weights   10/04/15 1230 10/05/15 0400 10/06/15 0429  Weight: 97 kg (213 lb 13.5 oz) 100 kg (220 lb 7.4 oz) 97 kg (213 lb 13.5 oz)    Intake/Output: I/O last 3 completed shifts: In: 700 [NG/GT:700] Out: 590 [Other:590]   Intake/Output this shift:  Total I/O In: 100 [NG/GT:100] Out: -   Physical Exam: General: No acute distress, chronically ill appearing  Head: NG in place, OM moist  Eyes: anicteric  Neck: Tracheostomy in place, vent assisted  Lungs:  Scattered rhonchi, normal effort  Heart: S1S2 +tachycardia  Abdomen:  Soft, nontender,    Extremities: +++ dependent edema, anasarca  Neurologic: Lethargic but arousable  Access:  R IJ permcath.       Basic Metabolic Panel:  Recent Labs Lab 10/07/15 1600 10/09/15 0506 10/10/15 0613  NA 141 139 141  K 4.3 4.7 5.2*  CL 100* 100* 99*  CO2 34* 32 31  GLUCOSE 146* 123* 81  BUN 64* 91* 100*  CREATININE 0.98 1.35* 1.63*  CALCIUM 8.4* 8.4* 8.5*  PHOS 3.4  --   --     Liver Function Tests:  Recent Labs Lab 10/07/15 1600  ALBUMIN 2.6*   No results for input(s): LIPASE, AMYLASE in the last 168 hours. No results for input(s): AMMONIA in the last 168 hours.  CBC:  Recent Labs Lab 10/07/15 1600 10/09/15 0506 10/10/15 0613  WBC 11.7* 9.1 7.7  HGB 8.8* 8.0* 8.4*  HCT 28.5* 25.7* 26.6*  MCV 103.4* 102.7* 101.3*  PLT 201 191 195    Cardiac Enzymes: No results for input(s): CKTOTAL, CKMB, CKMBINDEX, TROPONINI in the last 168 hours.  BNP: Invalid input(s):  POCBNP  CBG:  Recent Labs Lab 10/10/15 1617 10/10/15 2007 10/10/15 2332 10/11/15 0326 10/11/15 0728  GLUCAP 68 71 83 97 98    Microbiology: Results for orders placed or performed during the hospital encounter of 07/25/2015  Blood Culture (routine x 2)     Status: None   Collection Time: 08/11/2015  8:51 AM  Result Value Ref Range Status   Specimen Description BLOOD Dolores HooseUNKO  Final   Special Requests BOTTLES DRAWN AEROBIC AND ANAEROBIC  3CC  Final   Culture NO GROWTH 5 DAYS  Final   Report Status 08/17/2015 FINAL  Final  Blood Culture (routine x 2)     Status: None   Collection Time: 07/21/2015  9:20 AM  Result Value Ref Range Status   Specimen Description BLOOD LEFT ARM  Final   Special Requests BOTTLES DRAWN AEROBIC AND ANAEROBIC  1CC  Final   Culture NO GROWTH 5 DAYS  Final   Report Status 08/17/2015 FINAL  Final  Wound culture     Status: None   Collection Time: 08/08/2015  9:20 AM  Result Value Ref Range Status   Specimen Description DECUBITIS  Final   Special Requests Normal  Final   Gram Stain   Final    FEW WBC SEEN MANY GRAM NEGATIVE RODS RARE GRAM POSITIVE COCCI  Culture   Final    HEAVY GROWTH ESCHERICHIA COLI MODERATE GROWTH ENTEROBACTER AEROGENES PROTEUS MIRABILIS HEAVY GROWTH ENTEROCOCCUS SPECIES VRE HAVE INTRINSIC RESISTANCE TO MOST COMMONLY USED ANTIBIOTICS AND THE ABILITY TO ACQUIRE RESISTANCE TO MOST AVAILABLE ANTIBIOTICS.    Report Status 08/16/2015 FINAL  Final   Organism ID, Bacteria ESCHERICHIA COLI  Final   Organism ID, Bacteria ENTEROBACTER AEROGENES  Final   Organism ID, Bacteria PROTEUS MIRABILIS  Final   Organism ID, Bacteria ENTEROCOCCUS SPECIES  Final      Susceptibility   Enterobacter aerogenes - MIC*    CEFTAZIDIME <=1 SENSITIVE Sensitive     CEFAZOLIN >=64 RESISTANT Resistant     CEFTRIAXONE <=1 SENSITIVE Sensitive     CIPROFLOXACIN <=0.25 SENSITIVE Sensitive     GENTAMICIN <=1 SENSITIVE Sensitive     IMIPENEM 1 SENSITIVE Sensitive      TRIMETH/SULFA <=20 SENSITIVE Sensitive     * MODERATE GROWTH ENTEROBACTER AEROGENES   Escherichia coli - MIC*    AMPICILLIN <=2 SENSITIVE Sensitive     CEFTAZIDIME <=1 SENSITIVE Sensitive     CEFAZOLIN <=4 SENSITIVE Sensitive     CEFTRIAXONE <=1 SENSITIVE Sensitive     CIPROFLOXACIN <=0.25 SENSITIVE Sensitive     GENTAMICIN <=1 SENSITIVE Sensitive     IMIPENEM <=0.25 SENSITIVE Sensitive     TRIMETH/SULFA <=20 SENSITIVE Sensitive     * HEAVY GROWTH ESCHERICHIA COLI   Proteus mirabilis - MIC*    AMPICILLIN >=32 RESISTANT Resistant     CEFTAZIDIME <=1 SENSITIVE Sensitive     CEFAZOLIN 8 SENSITIVE Sensitive     CEFTRIAXONE <=1 SENSITIVE Sensitive     CIPROFLOXACIN <=0.25 SENSITIVE Sensitive     GENTAMICIN <=1 SENSITIVE Sensitive     IMIPENEM 1 SENSITIVE Sensitive     TRIMETH/SULFA <=20 SENSITIVE Sensitive     * PROTEUS MIRABILIS   Enterococcus species - MIC*    AMPICILLIN >=32 RESISTANT Resistant     VANCOMYCIN >=32 RESISTANT Resistant     GENTAMICIN SYNERGY SENSITIVE Sensitive     TETRACYCLINE Value in next row Resistant      RESISTANT>=16    * HEAVY GROWTH ENTEROCOCCUS SPECIES  MRSA PCR Screening     Status: None   Collection Time: 08/09/2015  2:38 PM  Result Value Ref Range Status   MRSA by PCR NEGATIVE NEGATIVE Final    Comment:        The GeneXpert MRSA Assay (FDA approved for NASAL specimens only), is one component of a comprehensive MRSA colonization surveillance program. It is not intended to diagnose MRSA infection nor to guide or monitor treatment for MRSA infections.   Blood culture (single)     Status: None   Collection Time: 07/23/2015  3:36 PM  Result Value Ref Range Status   Specimen Description BLOOD RIGHT ASSIST CONTROL  Final   Special Requests BOTTLES DRAWN AEROBIC AND ANAEROBIC  Final   Culture NO GROWTH 5 DAYS  Final   Report Status 08/17/2015 FINAL  Final  Wound culture     Status: None   Collection Time: 08/13/15 12:37 PM  Result Value Ref Range  Status   Specimen Description WOUND  Final   Special Requests Normal  Final   Gram Stain   Final    FEW WBC SEEN TOO NUMEROUS TO COUNT GRAM NEGATIVE RODS FEW GRAM POSITIVE COCCI    Culture   Final    HEAVY GROWTH ESCHERICHIA COLI MODERATE GROWTH PROTEUS MIRABILIS LIGHT GROWTH KLEBSIELLA PNEUMONIAE MODERATE GROWTH ENTEROCOCCUS GALLINARUM  CRITICAL RESULT CALLED TO, READ BACK BY AND VERIFIED WITH: [facility] BORBA AT 1042 08/16/15 DV    Report Status 08/17/2015 FINAL  Final   Organism ID, Bacteria ESCHERICHIA COLI  Final   Organism ID, Bacteria PROTEUS MIRABILIS  Final   Organism ID, Bacteria KLEBSIELLA PNEUMONIAE  Final   Organism ID, Bacteria ENTEROCOCCUS GALLINARUM  Final      Susceptibility   Escherichia coli - MIC*    AMPICILLIN >=32 RESISTANT Resistant     CEFTAZIDIME 4 RESISTANT Resistant     CEFAZOLIN >=64 RESISTANT Resistant     CEFTRIAXONE 16 RESISTANT Resistant     GENTAMICIN 2 SENSITIVE Sensitive     IMIPENEM >=16 RESISTANT Resistant     TRIMETH/SULFA <=20 SENSITIVE Sensitive     Extended ESBL POSITIVE Resistant     PIP/TAZO Value in next row Resistant      RESISTANT>=128    CIPROFLOXACIN Value in next row Sensitive      SENSITIVE<=0.25    * HEAVY GROWTH ESCHERICHIA COLI   Klebsiella pneumoniae - MIC*    AMPICILLIN Value in next row Resistant      SENSITIVE<=0.25    CEFTAZIDIME Value in next row Resistant      SENSITIVE<=0.25    CEFAZOLIN Value in next row Resistant      SENSITIVE<=0.25    CEFTRIAXONE Value in next row Resistant      SENSITIVE<=0.25    CIPROFLOXACIN Value in next row Resistant      SENSITIVE<=0.25    GENTAMICIN Value in next row Sensitive      SENSITIVE<=0.25    IMIPENEM Value in next row Resistant      SENSITIVE<=0.25    TRIMETH/SULFA Value in next row Resistant      SENSITIVE<=0.25    PIP/TAZO Value in next row Resistant      RESISTANT>=128    * LIGHT GROWTH KLEBSIELLA PNEUMONIAE   Proteus mirabilis - MIC*    AMPICILLIN Value in next row  Resistant      RESISTANT>=128    CEFTAZIDIME Value in next row Sensitive      RESISTANT>=128    CEFAZOLIN Value in next row Sensitive      RESISTANT>=128    CEFTRIAXONE Value in next row Sensitive      RESISTANT>=128    CIPROFLOXACIN Value in next row Sensitive      RESISTANT>=128    GENTAMICIN Value in next row Sensitive      RESISTANT>=128    IMIPENEM Value in next row Sensitive      RESISTANT>=128    TRIMETH/SULFA Value in next row Sensitive      RESISTANT>=128    PIP/TAZO Value in next row Sensitive      SENSITIVE<=4    * MODERATE GROWTH PROTEUS MIRABILIS   Enterococcus gallinarum - MIC*    AMPICILLIN Value in next row Resistant      SENSITIVE<=4    GENTAMICIN SYNERGY Value in next row Sensitive      SENSITIVE<=4    CIPROFLOXACIN Value in next row Resistant      RESISTANT>=8    TETRACYCLINE Value in next row Resistant      RESISTANT>=16    * MODERATE GROWTH ENTEROCOCCUS GALLINARUM  Wound culture     Status: None   Collection Time: 08/15/15  2:53 PM  Result Value Ref Range Status   Specimen Description WOUND  Final   Special Requests NONE  Final   Gram Stain FEW WBC SEEN NO ORGANISMS SEEN   Final  Culture NO GROWTH 3 DAYS  Final   Report Status 08/18/2015 FINAL  Final  C difficile quick scan w PCR reflex     Status: None   Collection Time: 08/17/15 11:34 AM  Result Value Ref Range Status   C Diff antigen NEGATIVE NEGATIVE Final   C Diff toxin NEGATIVE NEGATIVE Final   C Diff interpretation Negative for C. difficile  Final  Stool culture     Status: None   Collection Time: 09/03/15  3:51 PM  Result Value Ref Range Status   Specimen Description STOOL  Final   Special Requests Immunocompromised  Final   Culture   Final    NO SALMONELLA OR SHIGELLA ISOLATED No Pathogenic E. coli detected NO CAMPYLOBACTER DETECTED    Report Status 09/07/2015 FINAL  Final  C difficile quick scan w PCR reflex     Status: None   Collection Time: 09/13/15 12:51 AM  Result Value  Ref Range Status   C Diff antigen NEGATIVE NEGATIVE Final   C Diff toxin NEGATIVE NEGATIVE Final   C Diff interpretation Negative for C. difficile  Final  Culture, blood (routine x 2)     Status: None   Collection Time: 09/16/15 11:24 AM  Result Value Ref Range Status   Specimen Description BLOOD LEFT HAND  Final   Special Requests BOTTLES DRAWN AEROBIC AND ANAEROBIC  1CC  Final   Culture NO GROWTH 5 DAYS  Final   Report Status 09/21/2015 FINAL  Final  Culture, blood (routine x 2)     Status: None   Collection Time: 09/16/15 12:23 PM  Result Value Ref Range Status   Specimen Description BLOOD RIGHT HAND  Final   Special Requests BOTTLES DRAWN AEROBIC AND ANAEROBIC  1CC  Final   Culture  Setup Time   Final    GRAM POSITIVE COCCI AEROBIC BOTTLE ONLY CRITICAL RESULT CALLED TO, READ BACK BY AND VERIFIED WITH: TESS THOMAS,RN 09/17/2015 0631 BY JRS.    Culture   Final    ENTEROCOCCUS FAECALIS AEROBIC BOTTLE ONLY VRE HAVE INTRINSIC RESISTANCE TO MOST COMMONLY USED ANTIBIOTICS AND THE ABILITY TO ACQUIRE RESISTANCE TO MOST AVAILABLE ANTIBIOTICS. CRITICAL RESULT CALLED TO, READ BACK BY AND VERIFIED WITH: CHERYL SMITH AT [id number] 09/19/15 DV    Report Status 09/21/2015 FINAL  Final   Organism ID, Bacteria ENTEROCOCCUS FAECALIS  Final      Susceptibility   Enterococcus faecalis - MIC*    AMPICILLIN <=2 SENSITIVE Sensitive     LINEZOLID 2 SENSITIVE Sensitive     CIPROFLOXACIN Value in next row Resistant      RESISTANT>=8    TETRACYCLINE Value in next row Resistant      RESISTANT>=16    VANCOMYCIN Value in next row Resistant      RESISTANT>=32    GENTAMICIN SYNERGY Value in next row Resistant      RESISTANT>=32    * ENTEROCOCCUS FAECALIS  Culture, respiratory (NON-Expectorated)     Status: None   Collection Time: 09/16/15  3:50 PM  Result Value Ref Range Status   Specimen Description TRACHEAL ASPIRATE  Final   Special Requests Immunocompromised  Final   Gram Stain   Final    FEW WBC  SEEN GOOD SPECIMEN - 80-90% WBCS RARE GRAM NEGATIVE RODS    Culture LIGHT GROWTH PSEUDOMONAS AERUGINOSA  Final   Report Status 09/19/2015 FINAL  Final   Organism ID, Bacteria PSEUDOMONAS AERUGINOSA  Final      Susceptibility   Pseudomonas aeruginosa -  MIC*    CEFTAZIDIME 8 SENSITIVE Sensitive     CIPROFLOXACIN 2 INTERMEDIATE Intermediate     GENTAMICIN >=16 RESISTANT Resistant     IMIPENEM >=16 RESISTANT Resistant     PIP/TAZO Value in next row Sensitive      SENSITIVE32    CEFEPIME Value in next row Sensitive      SENSITIVE8    LEVOFLOXACIN Value in next row Resistant      RESISTANT>=8    * LIGHT GROWTH PSEUDOMONAS AERUGINOSA  Culture, expectorated sputum-assessment     Status: None   Collection Time: 09/22/15  2:09 PM  Result Value Ref Range Status   Specimen Description ENDOTRACHEAL  Final   Special Requests Normal  Final   Sputum evaluation THIS SPECIMEN IS ACCEPTABLE FOR SPUTUM CULTURE  Final   Report Status 09/24/2015 FINAL  Final  Culture, respiratory (NON-Expectorated)     Status: None   Collection Time: 09/22/15  2:09 PM  Result Value Ref Range Status   Specimen Description ENDOTRACHEAL  Final   Special Requests Normal Reflexed from [id number]  Final   Gram Stain   Final    FEW WBC SEEN FEW GRAM NEGATIVE RODS POOR SPECIMEN - LESS THAN 70% WBCS    Culture   Final    MODERATE GROWTH PSEUDOMONAS AERUGINOSA LIGHT GROWTH KLEBSIELLA PNEUMONIAE REFER TO SENSITIVITIES FROM PREVIOUS CULTURE FOR ORG 2    Report Status 10/01/2015 FINAL  Final   Organism ID, Bacteria PSEUDOMONAS AERUGINOSA  Final   Organism ID, Bacteria KLEBSIELLA PNEUMONIAE  Final      Susceptibility   Pseudomonas aeruginosa - MIC*    CEFTAZIDIME 4 SENSITIVE Sensitive     CIPROFLOXACIN 2 INTERMEDIATE Intermediate     GENTAMICIN >=16 RESISTANT Resistant     IMIPENEM >=16 RESISTANT Resistant     PIP/TAZO Value in next row Sensitive      SENSITIVE16    * MODERATE GROWTH PSEUDOMONAS AERUGINOSA  Tissue  culture     Status: None   Collection Time: 09/24/15  6:44 AM  Result Value Ref Range Status   Specimen Description BONE  Final   Special Requests Normal  Final   Gram Stain MODERATE WBC SEEN FEW GRAM NEGATIVE RODS   Final   Culture   Final    MODERATE GROWTH ESCHERICHIA COLI MODERATE GROWTH KLEBSIELLA PNEUMONIAE ESBL-EXTENDED SPECTRUM BETA LACTAMASE-THE ORGANISM IS RESISTANT TO PENICILLINS, CEPHALOSPORINS AND AZTREONAM ACCORDING TO CLSI M100-S15 VOL.25 N01 JAN 2005. ORGANISM 1 This organism isolate is resistant to one or more antiotic agents in three or more antimicrobial categories.  Suggest Infectious Disease consult.   ORGANISM 2 CRITICAL RESULT CALLED TO, READ BACK BY AND VERIFIED WITH: RN Mardene Celeste LINDSAY 09/27/15 1005AM    Report Status 09/28/2015 FINAL  Final   Organism ID, Bacteria ESCHERICHIA COLI  Final   Organism ID, Bacteria KLEBSIELLA PNEUMONIAE  Final      Susceptibility   Escherichia coli - MIC*    AMPICILLIN >=32 RESISTANT Resistant     CEFTAZIDIME 4 RESISTANT Resistant     CEFAZOLIN >=64 RESISTANT Resistant     CEFTRIAXONE 8 RESISTANT Resistant     GENTAMICIN <=1 SENSITIVE Sensitive     IMIPENEM 8 RESISTANT Resistant     TRIMETH/SULFA <=20 SENSITIVE Sensitive     Extended ESBL POSITIVE Resistant     PIP/TAZO Value in next row Resistant      RESISTANT>=128    * MODERATE GROWTH ESCHERICHIA COLI   Klebsiella pneumoniae - MIC*    AMPICILLIN Value  in next row Resistant      RESISTANT>=128    CEFTAZIDIME Value in next row Resistant      RESISTANT>=128    CEFAZOLIN Value in next row Resistant      RESISTANT>=128    CEFTRIAXONE Value in next row Resistant      RESISTANT>=128    CIPROFLOXACIN Value in next row Resistant      RESISTANT>=128    GENTAMICIN Value in next row Resistant      RESISTANT>=128    IMIPENEM Value in next row Resistant      RESISTANT>=128    TRIMETH/SULFA Value in next row Sensitive      RESISTANT>=128    PIP/TAZO Value in next row Resistant       RESISTANT>=128    * MODERATE GROWTH KLEBSIELLA PNEUMONIAE  Anaerobic culture     Status: None   Collection Time: 09/24/15  3:55 PM  Result Value Ref Range Status   Specimen Description BONE  Final   Special Requests Normal  Final   Culture NO ANAEROBES ISOLATED  Final   Report Status 09/29/2015 FINAL  Final  C difficile quick scan w PCR reflex     Status: None   Collection Time: 10/07/15  2:02 PM  Result Value Ref Range Status   C Diff antigen NEGATIVE NEGATIVE Final   C Diff toxin NEGATIVE NEGATIVE Final   C Diff interpretation Negative for C. difficile  Final    Coagulation Studies: No results for input(s): LABPROT, INR in the last 72 hours.  Urinalysis: No results for input(s): COLORURINE, LABSPEC, PHURINE, GLUCOSEU, HGBUR, BILIRUBINUR, KETONESUR, PROTEINUR, UROBILINOGEN, NITRITE, LEUKOCYTESUR in the last 72 hours.  Invalid input(s): APPERANCEUR    Imaging: No results found.   Medications:   . feeding supplement (VITAL AF 1.2 CAL) 1,000 mL (10/11/15 0400)  . norepinephrine Stopped (10/08/15 0530)   . amantadine  100 mg Per Tube BID  . antiseptic oral rinse  7 mL Mouth Rinse q12n4p  . aspirin  81 mg Oral Daily  . chlorhexidine  15 mL Mouth Rinse BID  . epoetin (EPOGEN/PROCRIT) injection  10,000 Units Intravenous Q M,W,F-HD  . feeding supplement (PRO-STAT SUGAR FREE 64)  30 mL Oral 6 X Daily  . folic acid  1 mg Oral Daily  . free water  20 mL Per Tube 6 times per day  . heparin subcutaneous  5,000 Units Subcutaneous Q12H  . hydrocortisone  10 mg Per Tube BID  . insulin aspart  0-9 Units Subcutaneous 6 times per day  . ipratropium-albuterol  3 mL Nebulization QID  . lidocaine  1 patch Transdermal Q24H  . loperamide  2 mg Oral Daily  . midodrine  5 mg Per Tube TID WC  . multivitamin  5 mL Oral Daily  . pantoprazole sodium  40 mg Per Tube Daily  . sodium chloride  10-40 mL Intracatheter Q12H  . sulfamethoxazole-trimethoprim  2.5 tablet Per Tube q1800  .  thiamine IV  100 mg Intravenous Daily   sodium chloride, sodium chloride, acetaminophen (TYLENOL) oral liquid 160 mg/5 mL, HYDROmorphone (DILAUDID) injection, morphine injection, ondansetron (ZOFRAN) IV, oxyCODONE, sodium chloride, zinc oxide  Assessment/ Plan:  49 y.o. 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 Admitted on 08/11/2015  1. End-stage renal disease on hemodialysis on HD MWF. The patient has been on dialysis since October of 2014. R IJ permcath. Dialysis treatment with IV albumin for oncotic support.  - BUN noted to be high - high risk of hypotension during dialysis  - slow BFR - Next HD on wednesday  2. Anemia of CKD: hgb down to 8.4, continue epogen with HD.  3. Secondary hyperparathyroidism: PTH low at 55 (9/25) -phos 3.4 at last check, no need for binders.   4. Sepsis: VRE in blood. Bone cultures E. Coli and klebsiella - Treatment as per ID and ICU team: daptomycin completed on 11/10 and now on trimethoprim/sulfamethoxazole.   5. Generalized Dependent edema/Anasarca due to 3rd spacing -  Not able to take much fluid off due to intradialytic hypotention  6. Acute resp failure - back on ventilator .  LOS: 60 Ella Guillotte 11/22/20169:13 AM

## 2015-10-11 NOTE — Progress Notes (Signed)
MEDICATION RELATED CONSULT NOTE - INITIAL   Pharmacy Consult for Renal dosing  Allergies  Allergen Reactions  . Contrast Media [Iodinated Diagnostic Agents] Anaphylaxis  . Ampicillin Rash    Patient Measurements: Height:  (175.3 cm) Weight:  (unable to perform; scale on bed broken) IBW/kg (Calculated) : 66.2  Vital Signs: Temp: 98.4 F (36.9 C) (11/22 0822) Temp Source: Axillary (11/22 0822) BP: 106/50 mmHg (11/22 1000) Pulse Rate: 114 (11/22 1000) Intake/Output from previous day: 11/21 0701 - 11/22 0700 In: 700 [NG/GT:700] Out: 590  Intake/Output from this shift: Total I/O In: 230 [NG/GT:230] Out: -   Labs:  Recent Labs  10/09/15 0506 10/10/15 0613  WBC 9.1 7.7  HGB 8.0* 8.4*  HCT 25.7* 26.6*  PLT 191 195  CREATININE 1.35* 1.63*   Estimated Creatinine Clearance: 51.2 mL/min (by C-G formula based on Cr of 1.63).   Microbiology: Recent Results (from the past 720 hour(s))  C difficile quick scan w PCR reflex     Status: None   Collection Time: 09/13/15 12:51 AM  Result Value Ref Range Status   C Diff antigen NEGATIVE NEGATIVE Final   C Diff toxin NEGATIVE NEGATIVE Final   C Diff interpretation Negative for C. difficile  Final  Culture, blood (routine x 2)     Status: None   Collection Time: 09/16/15 11:24 AM  Result Value Ref Range Status   Specimen Description BLOOD LEFT HAND  Final   Special Requests BOTTLES DRAWN AEROBIC AND ANAEROBIC  1CC  Final   Culture NO GROWTH 5 DAYS  Final   Report Status 09/21/2015 FINAL  Final  Culture, blood (routine x 2)     Status: None   Collection Time: 09/16/15 12:23 PM  Result Value Ref Range Status   Specimen Description BLOOD RIGHT HAND  Final   Special Requests BOTTLES DRAWN AEROBIC AND ANAEROBIC  1CC  Final   Culture  Setup Time   Final    GRAM POSITIVE COCCI AEROBIC BOTTLE ONLY CRITICAL RESULT CALLED TO, READ BACK BY AND VERIFIED WITH: TESS THOMAS,RN 09/17/2015 0631 BY JRS.    Culture   Final   ENTEROCOCCUS FAECALIS AEROBIC BOTTLE ONLY VRE HAVE INTRINSIC RESISTANCE TO MOST COMMONLY USED ANTIBIOTICS AND THE ABILITY TO ACQUIRE RESISTANCE TO MOST AVAILABLE ANTIBIOTICS. CRITICAL RESULT CALLED TO, READ BACK BY AND VERIFIED WITH: CHERYL SMITH AT 1610 09/19/15 DV    Report Status 09/21/2015 FINAL  Final   Organism ID, Bacteria ENTEROCOCCUS FAECALIS  Final      Susceptibility   Enterococcus faecalis - MIC*    AMPICILLIN <=2 SENSITIVE Sensitive     LINEZOLID 2 SENSITIVE Sensitive     CIPROFLOXACIN Value in next row Resistant      RESISTANT>=8    TETRACYCLINE Value in next row Resistant      RESISTANT>=16    VANCOMYCIN Value in next row Resistant      RESISTANT>=32    GENTAMICIN SYNERGY Value in next row Resistant      RESISTANT>=32    * ENTEROCOCCUS FAECALIS  Culture, respiratory (NON-Expectorated)     Status: None   Collection Time: 09/16/15  3:50 PM  Result Value Ref Range Status   Specimen Description TRACHEAL ASPIRATE  Final   Special Requests Immunocompromised  Final   Gram Stain   Final    FEW WBC SEEN GOOD SPECIMEN - 80-90% WBCS RARE GRAM NEGATIVE RODS    Culture LIGHT GROWTH PSEUDOMONAS AERUGINOSA  Final   Report Status 09/19/2015 FINAL  Final  Organism ID, Bacteria PSEUDOMONAS AERUGINOSA  Final      Susceptibility   Pseudomonas aeruginosa - MIC*    CEFTAZIDIME 8 SENSITIVE Sensitive     CIPROFLOXACIN 2 INTERMEDIATE Intermediate     GENTAMICIN >=16 RESISTANT Resistant     IMIPENEM >=16 RESISTANT Resistant     PIP/TAZO Value in next row Sensitive      SENSITIVE32    CEFEPIME Value in next row Sensitive      SENSITIVE8    LEVOFLOXACIN Value in next row Resistant      RESISTANT>=8    * LIGHT GROWTH PSEUDOMONAS AERUGINOSA  Culture, expectorated sputum-assessment     Status: None   Collection Time: 09/22/15  2:09 PM  Result Value Ref Range Status   Specimen Description ENDOTRACHEAL  Final   Special Requests Normal  Final   Sputum evaluation THIS SPECIMEN IS  ACCEPTABLE FOR SPUTUM CULTURE  Final   Report Status 09/24/2015 FINAL  Final  Culture, respiratory (NON-Expectorated)     Status: None   Collection Time: 09/22/15  2:09 PM  Result Value Ref Range Status   Specimen Description ENDOTRACHEAL  Final   Special Requests Normal Reflexed from [id number]  Final   Gram Stain   Final    FEW WBC SEEN FEW GRAM NEGATIVE RODS POOR SPECIMEN - LESS THAN 70% WBCS    Culture   Final    MODERATE GROWTH PSEUDOMONAS AERUGINOSA LIGHT GROWTH KLEBSIELLA PNEUMONIAE REFER TO SENSITIVITIES FROM PREVIOUS CULTURE FOR ORG 2    Report Status 10/01/2015 FINAL  Final   Organism ID, Bacteria PSEUDOMONAS AERUGINOSA  Final   Organism ID, Bacteria KLEBSIELLA PNEUMONIAE  Final      Susceptibility   Pseudomonas aeruginosa - MIC*    CEFTAZIDIME 4 SENSITIVE Sensitive     CIPROFLOXACIN 2 INTERMEDIATE Intermediate     GENTAMICIN >=16 RESISTANT Resistant     IMIPENEM >=16 RESISTANT Resistant     PIP/TAZO Value in next row Sensitive      SENSITIVE16    * MODERATE GROWTH PSEUDOMONAS AERUGINOSA  Tissue culture     Status: None   Collection Time: 09/24/15  6:44 AM  Result Value Ref Range Status   Specimen Description BONE  Final   Special Requests Normal  Final   Gram Stain MODERATE WBC SEEN FEW GRAM NEGATIVE RODS   Final   Culture   Final    MODERATE GROWTH ESCHERICHIA COLI MODERATE GROWTH KLEBSIELLA PNEUMONIAE ESBL-EXTENDED SPECTRUM BETA LACTAMASE-THE ORGANISM IS RESISTANT TO PENICILLINS, CEPHALOSPORINS AND AZTREONAM ACCORDING TO CLSI [id number] VOL.25 N01 JAN 2005. ORGANISM 1 This organism isolate is resistant to one or more antiotic agents in three or more antimicrobial categories.  Suggest Infectious Disease consult.   ORGANISM 2 CRITICAL RESULT CALLED TO, READ BACK BY AND VERIFIED WITH: RN Mardene Celeste LINDSAY 09/27/15 1005AM    Report Status 09/28/2015 FINAL  Final   Organism ID, Bacteria ESCHERICHIA COLI  Final   Organism ID, Bacteria KLEBSIELLA PNEUMONIAE  Final       Susceptibility   Escherichia coli - MIC*    AMPICILLIN >=32 RESISTANT Resistant     CEFTAZIDIME 4 RESISTANT Resistant     CEFAZOLIN >=64 RESISTANT Resistant     CEFTRIAXONE 8 RESISTANT Resistant     GENTAMICIN <=1 SENSITIVE Sensitive     IMIPENEM 8 RESISTANT Resistant     TRIMETH/SULFA <=20 SENSITIVE Sensitive     Extended ESBL POSITIVE Resistant     PIP/TAZO Value in next row Resistant      RESISTANT>=128    *  MODERATE GROWTH ESCHERICHIA COLI   Klebsiella pneumoniae - MIC*    AMPICILLIN Value in next row Resistant      RESISTANT>=128    CEFTAZIDIME Value in next row Resistant      RESISTANT>=128    CEFAZOLIN Value in next row Resistant      RESISTANT>=128    CEFTRIAXONE Value in next row Resistant      RESISTANT>=128    CIPROFLOXACIN Value in next row Resistant      RESISTANT>=128    GENTAMICIN Value in next row Resistant      RESISTANT>=128    IMIPENEM Value in next row Resistant      RESISTANT>=128    TRIMETH/SULFA Value in next row Sensitive      RESISTANT>=128    PIP/TAZO Value in next row Resistant      RESISTANT>=128    * MODERATE GROWTH KLEBSIELLA PNEUMONIAE  Anaerobic culture     Status: None   Collection Time: 09/24/15  3:55 PM  Result Value Ref Range Status   Specimen Description BONE  Final   Special Requests Normal  Final   Culture NO ANAEROBES ISOLATED  Final   Report Status 09/29/2015 FINAL  Final  C difficile quick scan w PCR reflex     Status: None   Collection Time: 10/07/15  2:02 PM  Result Value Ref Range Status   C Diff antigen NEGATIVE NEGATIVE Final   C Diff toxin NEGATIVE NEGATIVE Final   C Diff interpretation Negative for C. difficile  Final    Medical History: Past Medical History  Diagnosis Date  . Obesity   . Dyslipidemia   . Hypertension   . Coronary artery disease     s/p BMS 2010 LAD  . Dysrhythmia     ventricular tachycardia resolved after LAD stent and beta blocker  . Diabetes mellitus     with retinopathy, neuropathy and  microalbuminemia  . ESRD (end stage renal disease) on dialysis     Assessment: 49 yo patient on HD. Pharmacy consulted for renal dosing of medications.   Plan:  Patient is currently on Amantadine 100mg  BID. Will dose adjust to correct HD dose of  200mg  every 7 days. Pharmacy will continue to monitor patients medications and dose adjust as needed.    Cher NakaiSheema Chason Mciver, PharmD Pharmacy Resident 10/11/2015 10:55 AM

## 2015-10-11 NOTE — Progress Notes (Signed)
Occupational Therapy Treatment Patient Details Name: Courtney [name] MRN: [id number] DOB: 05/13/1966 Today's Date: 10/11/2015    History of present illness Pt is a 75 F who has been in medical facilities (hosp, LTAC, rehab) for 2 yrs following gastric bypass surgery with multiple complications. Now with chronic trach, ESRD, profound debilitation, severe sacral pressure ulcer (stage IV). Was seemingly making progress and transferred to rehab facility approx one week prior to this admission. She was sent to [facility] ED 9/23 with AMS and hypotension. Admitted with sepsis related to infected sacral ulcer.  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  Patient transferred to CCU to med-surg floor during hospitalization, but experienced profound interdialytic hypotension requiring return transfer to CCU for use of pressors (now off); remains in CCU.  Noted with R hip intertrochanteric fracture, recommended for conservative management per orthopedics.  Hospital course continues to be complicated with patient requiring return to vent 11/5 for respiratory decline with use of pressors (now off); extubated to trach collar 11/10; limited ability/willingness to follow commands or actively participate in therapeutic interventions.  Per care team, physicians recommending hospice care/comfort measures at this time; husband considering goals of care/POC pending discussion with patient and other family members, but wishes continued medical care at this time. Will carefully monitor medical plan and POC to evaluate appropriateness of continued PT throughout remaining hospitalization.   OT comments  Entered the room and hands were elevated. Edema better, still some, more on right massage to both hands, elevated right hand some more. Patient willing to do a few hand pumps on  left to help edema.  Follow Up Recommendations       Equipment Recommendations       Recommendations for Other Services      Precautions / Restrictions Precautions Precautions: Fall Precaution Comments: stage 4 sacral ulcer, NG tube/NPO, trach, contact isolation, R IJ permcath, L IJ central line, log roll only (as needed for dressing changes/hygiene) and no ROM to R LE/no attempts at sitting Restrictions Weight Bearing Restrictions: Yes RLE Weight Bearing: Non weight bearing Other Position/Activity Restrictions: No ROM R LE       Mobility Bed Mobility                  Transfers                      Balance                                   ADL                                                Vision                     Perception     Praxis      Cognition                             Extremity/Trunk Assessment               Exercises   Shoulder Instructions       General  Comments      Pertinent Vitals/ Pain         Home Living                                          Prior Functioning/Environment              Frequency       Progress Toward Goals  OT Goals(current goals can now be found in the care plan section)     Acute Rehab OT Goals Patient Stated Goal: patient unable to verbalize/state goal  Plan      Co-evaluation                 End of Session     Activity Tolerance     Patient Left     Nurse Communication          Time: [id number] OT Time Calculation (min): 10 min  Charges: OT General Charges $OT Visit: 1 Procedure OT Treatments $Massage: 8-22 mins Courtney CornfieldJohn [name] Lynnel Zanetti, MS/OTR/L  Courtney CornfieldHuff, Breklyn Fabrizio [name] 10/11/2015, 4:15 PM

## 2015-10-12 LAB — RENAL FUNCTION PANEL
ALBUMIN: 2.2 g/dL — AB (ref 3.5–5.0)
Anion gap: 9 (ref 5–15)
BUN: 84 mg/dL — AB (ref 6–20)
CALCIUM: 8.5 mg/dL — AB (ref 8.9–10.3)
CO2: 32 mmol/L (ref 22–32)
Chloride: 101 mmol/L (ref 101–111)
Creatinine, Ser: 1.42 mg/dL — ABNORMAL HIGH (ref 0.44–1.00)
GFR calc Af Amer: 49 mL/min — ABNORMAL LOW (ref >60)
GFR calc non Af Amer: 42 mL/min — ABNORMAL LOW (ref >60)
GLUCOSE: 136 mg/dL — AB (ref 65–99)
PHOSPHORUS: 5.1 mg/dL — AB (ref 2.5–4.6)
Potassium: 4.7 mmol/L (ref 3.5–5.1)
SODIUM: 142 mmol/L (ref 135–145)

## 2015-10-12 LAB — GLUCOSE, CAPILLARY
GLUCOSE-CAPILLARY: 119 mg/dL — AB (ref 65–99)
GLUCOSE-CAPILLARY: 123 mg/dL — AB (ref 65–99)
GLUCOSE-CAPILLARY: 135 mg/dL — AB (ref 65–99)
GLUCOSE-CAPILLARY: 143 mg/dL — AB (ref 65–99)
Glucose-Capillary: 120 mg/dL — ABNORMAL HIGH (ref 65–99)
Glucose-Capillary: 138 mg/dL — ABNORMAL HIGH (ref 65–99)
Glucose-Capillary: 120 mg/dL — ABNORMAL HIGH (ref 65–99)

## 2015-10-12 LAB — CBC
HEMATOCRIT: 25.9 % — AB (ref 35.0–47.0)
HEMOGLOBIN: 8 g/dL — AB (ref 12.0–16.0)
MCH: 31.5 pg (ref 26.0–34.0)
MCHC: 30.7 g/dL — AB (ref 32.0–36.0)
MCV: 102.5 fL — AB (ref 80.0–100.0)
Platelets: 190 10*3/uL (ref 150–440)
RBC: 2.53 MIL/uL — ABNORMAL LOW (ref 3.80–5.20)
RDW: 18.2 % — AB (ref 11.5–14.5)
WBC: 8.7 10*3/uL (ref 3.6–11.0)

## 2015-10-12 MED ORDER — ALBUMIN HUMAN 25 % IV SOLN
25.0000 g | Freq: Once | INTRAVENOUS | Status: AC
  Administered 2015-10-12: 25 g via INTRAVENOUS
  Filled 2015-10-12: qty 100

## 2015-10-12 MED ORDER — ESCITALOPRAM OXALATE 10 MG PO TABS
10.0000 mg | ORAL_TABLET | Freq: Every day | ORAL | Status: DC
  Administered 2015-10-12 – 2015-11-23 (×43): 10 mg via ORAL
  Filled 2015-10-12 (×43): qty 1

## 2015-10-12 MED ORDER — CALCIUM ACETATE (PHOS BINDER) 667 MG/5ML PO SOLN
667.0000 mg | Freq: Three times a day (TID) | ORAL | Status: DC
  Administered 2015-10-12 – 2015-10-16 (×12): 667 mg via ORAL
  Filled 2015-10-12 (×33): qty 5

## 2015-10-12 MED ORDER — VITAMIN B-1 100 MG PO TABS
100.0000 mg | ORAL_TABLET | Freq: Every day | ORAL | Status: DC
  Administered 2015-10-13 – 2015-11-11 (×30): 100 mg
  Filled 2015-10-12 (×29): qty 1

## 2015-10-12 NOTE — Care Management (Signed)
Spoke with patient's husband.  Informed him [facility] and will be sending information.   Today patient is back on the ventilator, off pressors and tube feedings have been resumed at 50 cc/hr.  Discussed code status with husband.  Informed him that last week someone was turning on the light on  in patient's room and accidentally hit the code blue switch.  Discussed code status especially in regards to cardiac - pumping chest- shocking. Says that he is not in a place to make the call not to code patient.  He feels that patient still has a lot of fight left in her.  Discussed that as time goes by- patient is less able to "fight."    Mr Archie PattenMonroe also stated that he was not informed of  the code blue switch being pressed in error.   Discussed with him that flipping the wrong light switch is not something that needs to be reported to family.  He then relays that he does not always receive daily updates from rounding physicians.  Discussed that CM could try to investigate a way to get patient home maybe with private duty nurses but patient would have to stop dialysis.  Discussed that dialysis is keeping patient alive.  Mr Archie PattenMonroe stated when patient communicated to him that she was ready to go home - - to die- he would make sure arrangements are made- but Milly JakobHedda has not let him know that she is ready.  Reaffirmed that any time he is ready to make arrangements for patient to go home- just let this CM know.

## 2015-10-12 NOTE — Progress Notes (Signed)
Post Tx Vitals °

## 2015-10-12 NOTE — Progress Notes (Signed)
Pre-hd tx 

## 2015-10-12 NOTE — Progress Notes (Signed)
Subjective:  Remains critically ill back on ventilator BP borderline low Continues to have significant edema Patient seen during dialysis Tolerating well    HEMODIALYSIS FLOWSHEET:  Blood Flow Rate (mL/min): 300 mL/min Arterial Pressure (mmHg): -100 mmHg Venous Pressure (mmHg): 140 mmHg Transmembrane Pressure (mmHg): 50 mmHg Ultrafiltration Rate (mL/min): 240 mL/min Dialysate Flow Rate (mL/min): 600 ml/min Conductivity: Machine : 14.1 Conductivity: Machine : 14.1 Dialysis Fluid Bolus: Normal Saline Bolus Amount (mL): 250 mL Dialysate Change: Other (comment) Intra-Hemodialysis Comments: uf removed. RT at bedside with routine breathing tx and Dr. Thedore Mins at bedside assessing pt during routine rounds.      Objective:  Vital signs in last 24 hours:  Temp:  [97.2 F (36.2 C)-99.1 F (37.3 C)] 97.4 F (36.3 C) (11/23 0400) Pulse Rate:  [105-116] 114 (11/23 0900) Resp:  [16-28] 23 (11/23 0900) BP: (105-128)/(42-81) 109/42 mmHg (11/23 0900) SpO2:  [94 %-100 %] 97 % (11/23 0900) FiO2 (%):  [30 %] 30 % (11/23 0700)  Weight change:  Filed Weights   10/06/15 0429  Weight: 97 kg (213 lb 13.5 oz)    Intake/Output: I/O last 3 completed shifts: In: 2210 [NG/GT:2210] Out: 0    Intake/Output this shift:  Total I/O In: 100 [NG/GT:100] Out: -   Physical Exam: General: No acute distress, chronically ill appearing  Head: NG in place, OM moist  Eyes: anicteric  Neck: Tracheostomy in place, vent assisted  Lungs:  Scattered rhonchi, normal effort  Heart: S1S2 +tachycardia  Abdomen:  Soft, nontender,    Extremities: +++ dependent edema, anasarca  Neurologic: Lethargic but arousable  Access:  R IJ permcath.       Basic Metabolic Panel:  Recent Labs Lab 10/07/15 1600 10/09/15 0506 10/10/15 0613 10/12/15 0636  NA 141 139 141 142  K 4.3 4.7 5.2* 4.7  CL 100* 100* 99* 101  CO2 34* 32 31 32  GLUCOSE 146* 123* 81 136*  BUN 64* 91* 100* 84*  CREATININE 0.98  1.35* 1.63* 1.42*  CALCIUM 8.4* 8.4* 8.5* 8.5*  PHOS 3.4  --   --  5.1*    Liver Function Tests:  Recent Labs Lab 10/07/15 1600 10/12/15 0636  ALBUMIN 2.6* 2.2*   No results for input(s): LIPASE, AMYLASE in the last 168 hours. No results for input(s): AMMONIA in the last 168 hours.  CBC:  Recent Labs Lab 10/07/15 1600 10/09/15 0506 10/10/15 0613 10/12/15 0636  WBC 11.7* 9.1 7.7 8.7  HGB 8.8* 8.0* 8.4* 8.0*  HCT 28.5* 25.7* 26.6* 25.9*  MCV 103.4* 102.7* 101.3* 102.5*  PLT 201 191 195 190    Cardiac Enzymes: No results for input(s): CKTOTAL, CKMB, CKMBINDEX, TROPONINI in the last 168 hours.  BNP: Invalid input(s): POCBNP  CBG:  Recent Labs Lab 10/11/15 1604 10/11/15 2014 10/12/15 0003 10/12/15 0406 10/12/15 0721  GLUCAP 131* 148* 119* 123* 120*    Microbiology: Results for orders placed or performed during the hospital encounter of 07/29/2015  Blood Culture (routine x 2)     Status: None   Collection Time: 08/04/2015  8:51 AM  Result Value Ref Range Status   Specimen Description BLOOD [name]  Final   Special Requests BOTTLES DRAWN AEROBIC AND ANAEROBIC  3CC  Final   Culture NO GROWTH 5 DAYS  Final   Report Status 08/17/2015 FINAL  Final  Blood Culture (routine x 2)     Status: None   Collection Time: 07/22/2015  9:20 AM  Result Value Ref Range Status   Specimen Description  BLOOD LEFT ARM  Final   Special Requests BOTTLES DRAWN AEROBIC AND ANAEROBIC  1CC  Final   Culture NO GROWTH 5 DAYS  Final   Report Status 08/17/2015 FINAL  Final  Wound culture     Status: None   Collection Time: 08/06/2015  9:20 AM  Result Value Ref Range Status   Specimen Description DECUBITIS  Final   Special Requests Normal  Final   Gram Stain   Final    FEW WBC SEEN MANY GRAM NEGATIVE RODS RARE GRAM POSITIVE COCCI    Culture   Final    HEAVY GROWTH ESCHERICHIA COLI MODERATE GROWTH ENTEROBACTER AEROGENES PROTEUS MIRABILIS HEAVY GROWTH ENTEROCOCCUS SPECIES VRE HAVE INTRINSIC  RESISTANCE TO MOST COMMONLY USED ANTIBIOTICS AND THE ABILITY TO ACQUIRE RESISTANCE TO MOST AVAILABLE ANTIBIOTICS.    Report Status 08/16/2015 FINAL  Final   Organism ID, Bacteria ESCHERICHIA COLI  Final   Organism ID, Bacteria ENTEROBACTER AEROGENES  Final   Organism ID, Bacteria PROTEUS MIRABILIS  Final   Organism ID, Bacteria ENTEROCOCCUS SPECIES  Final      Susceptibility   Enterobacter aerogenes - MIC*    CEFTAZIDIME <=1 SENSITIVE Sensitive     CEFAZOLIN >=64 RESISTANT Resistant     CEFTRIAXONE <=1 SENSITIVE Sensitive     CIPROFLOXACIN <=0.25 SENSITIVE Sensitive     GENTAMICIN <=1 SENSITIVE Sensitive     IMIPENEM 1 SENSITIVE Sensitive     TRIMETH/SULFA <=20 SENSITIVE Sensitive     * MODERATE GROWTH ENTEROBACTER AEROGENES   Escherichia coli - MIC*    AMPICILLIN <=2 SENSITIVE Sensitive     CEFTAZIDIME <=1 SENSITIVE Sensitive     CEFAZOLIN <=4 SENSITIVE Sensitive     CEFTRIAXONE <=1 SENSITIVE Sensitive     CIPROFLOXACIN <=0.25 SENSITIVE Sensitive     GENTAMICIN <=1 SENSITIVE Sensitive     IMIPENEM <=0.25 SENSITIVE Sensitive     TRIMETH/SULFA <=20 SENSITIVE Sensitive     * HEAVY GROWTH ESCHERICHIA COLI   Proteus mirabilis - MIC*    AMPICILLIN >=32 RESISTANT Resistant     CEFTAZIDIME <=1 SENSITIVE Sensitive     CEFAZOLIN 8 SENSITIVE Sensitive     CEFTRIAXONE <=1 SENSITIVE Sensitive     CIPROFLOXACIN <=0.25 SENSITIVE Sensitive     GENTAMICIN <=1 SENSITIVE Sensitive     IMIPENEM 1 SENSITIVE Sensitive     TRIMETH/SULFA <=20 SENSITIVE Sensitive     * PROTEUS MIRABILIS   Enterococcus species - MIC*    AMPICILLIN >=32 RESISTANT Resistant     VANCOMYCIN >=32 RESISTANT Resistant     GENTAMICIN SYNERGY SENSITIVE Sensitive     TETRACYCLINE Value in next row Resistant      RESISTANT>=16    * HEAVY GROWTH ENTEROCOCCUS SPECIES  MRSA PCR Screening     Status: None   Collection Time: 08/17/2015  2:38 PM  Result Value Ref Range Status   MRSA by PCR NEGATIVE NEGATIVE Final    Comment:         The GeneXpert MRSA Assay (FDA approved for NASAL specimens only), is one component of a comprehensive MRSA colonization surveillance program. It is not intended to diagnose MRSA infection nor to guide or monitor treatment for MRSA infections.   Blood culture (single)     Status: None   Collection Time: 07/24/2015  3:36 PM  Result Value Ref Range Status   Specimen Description BLOOD RIGHT ASSIST CONTROL  Final   Special Requests BOTTLES DRAWN AEROBIC AND ANAEROBIC  Final   Culture NO GROWTH 5 DAYS  Final   Report Status 08/17/2015 FINAL  Final  Wound culture     Status: None   Collection Time: 08/13/15 12:37 PM  Result Value Ref Range Status   Specimen Description WOUND  Final   Special Requests Normal  Final   Gram Stain   Final    FEW WBC SEEN TOO NUMEROUS TO COUNT GRAM NEGATIVE RODS FEW GRAM POSITIVE COCCI    Culture   Final    HEAVY GROWTH ESCHERICHIA COLI MODERATE GROWTH PROTEUS MIRABILIS LIGHT GROWTH KLEBSIELLA PNEUMONIAE MODERATE GROWTH ENTEROCOCCUS GALLINARUM CRITICAL RESULT CALLED TO, READ BACK BY AND VERIFIED WITH: [facility] BORBA AT 1042 08/16/15 DV    Report Status 08/17/2015 FINAL  Final   Organism ID, Bacteria ESCHERICHIA COLI  Final   Organism ID, Bacteria PROTEUS MIRABILIS  Final   Organism ID, Bacteria KLEBSIELLA PNEUMONIAE  Final   Organism ID, Bacteria ENTEROCOCCUS GALLINARUM  Final      Susceptibility   Escherichia coli - MIC*    AMPICILLIN >=32 RESISTANT Resistant     CEFTAZIDIME 4 RESISTANT Resistant     CEFAZOLIN >=64 RESISTANT Resistant     CEFTRIAXONE 16 RESISTANT Resistant     GENTAMICIN 2 SENSITIVE Sensitive     IMIPENEM >=16 RESISTANT Resistant     TRIMETH/SULFA <=20 SENSITIVE Sensitive     Extended ESBL POSITIVE Resistant     PIP/TAZO Value in next row Resistant      RESISTANT>=128    CIPROFLOXACIN Value in next row Sensitive      SENSITIVE<=0.25    * HEAVY GROWTH ESCHERICHIA COLI   Klebsiella pneumoniae - MIC*    AMPICILLIN Value in  next row Resistant      SENSITIVE<=0.25    CEFTAZIDIME Value in next row Resistant      SENSITIVE<=0.25    CEFAZOLIN Value in next row Resistant      SENSITIVE<=0.25    CEFTRIAXONE Value in next row Resistant      SENSITIVE<=0.25    CIPROFLOXACIN Value in next row Resistant      SENSITIVE<=0.25    GENTAMICIN Value in next row Sensitive      SENSITIVE<=0.25    IMIPENEM Value in next row Resistant      SENSITIVE<=0.25    TRIMETH/SULFA Value in next row Resistant      SENSITIVE<=0.25    PIP/TAZO Value in next row Resistant      RESISTANT>=128    * LIGHT GROWTH KLEBSIELLA PNEUMONIAE   Proteus mirabilis - MIC*    AMPICILLIN Value in next row Resistant      RESISTANT>=128    CEFTAZIDIME Value in next row Sensitive      RESISTANT>=128    CEFAZOLIN Value in next row Sensitive      RESISTANT>=128    CEFTRIAXONE Value in next row Sensitive      RESISTANT>=128    CIPROFLOXACIN Value in next row Sensitive      RESISTANT>=128    GENTAMICIN Value in next row Sensitive      RESISTANT>=128    IMIPENEM Value in next row Sensitive      RESISTANT>=128    TRIMETH/SULFA Value in next row Sensitive      RESISTANT>=128    PIP/TAZO Value in next row Sensitive      SENSITIVE<=4    * MODERATE GROWTH PROTEUS MIRABILIS   Enterococcus gallinarum - MIC*    AMPICILLIN Value in next row Resistant      SENSITIVE<=4    GENTAMICIN SYNERGY Value in next row Sensitive  SENSITIVE<=4    CIPROFLOXACIN Value in next row Resistant      RESISTANT>=8    TETRACYCLINE Value in next row Resistant      RESISTANT>=16    * MODERATE GROWTH ENTEROCOCCUS GALLINARUM  Wound culture     Status: None   Collection Time: 08/15/15  2:53 PM  Result Value Ref Range Status   Specimen Description WOUND  Final   Special Requests NONE  Final   Gram Stain FEW WBC SEEN NO ORGANISMS SEEN   Final   Culture NO GROWTH 3 DAYS  Final   Report Status 08/18/2015 FINAL  Final  C difficile quick scan w PCR reflex     Status: None    Collection Time: 08/17/15 11:34 AM  Result Value Ref Range Status   C Diff antigen NEGATIVE NEGATIVE Final   C Diff toxin NEGATIVE NEGATIVE Final   C Diff interpretation Negative for C. difficile  Final  Stool culture     Status: None   Collection Time: 09/03/15  3:51 PM  Result Value Ref Range Status   Specimen Description STOOL  Final   Special Requests Immunocompromised  Final   Culture   Final    NO SALMONELLA OR SHIGELLA ISOLATED No Pathogenic E. coli detected NO CAMPYLOBACTER DETECTED    Report Status 09/07/2015 FINAL  Final  C difficile quick scan w PCR reflex     Status: None   Collection Time: 09/13/15 12:51 AM  Result Value Ref Range Status   C Diff antigen NEGATIVE NEGATIVE Final   C Diff toxin NEGATIVE NEGATIVE Final   C Diff interpretation Negative for C. difficile  Final  Culture, blood (routine x 2)     Status: None   Collection Time: 09/16/15 11:24 AM  Result Value Ref Range Status   Specimen Description BLOOD LEFT HAND  Final   Special Requests BOTTLES DRAWN AEROBIC AND ANAEROBIC  1CC  Final   Culture NO GROWTH 5 DAYS  Final   Report Status 09/21/2015 FINAL  Final  Culture, blood (routine x 2)     Status: None   Collection Time: 09/16/15 12:23 PM  Result Value Ref Range Status   Specimen Description BLOOD RIGHT HAND  Final   Special Requests BOTTLES DRAWN AEROBIC AND ANAEROBIC  1CC  Final   Culture  Setup Time   Final    GRAM POSITIVE COCCI AEROBIC BOTTLE ONLY CRITICAL RESULT CALLED TO, READ BACK BY AND VERIFIED WITH: TESS THOMAS,RN 09/17/2015 0631 BY JRS.    Culture   Final    ENTEROCOCCUS FAECALIS AEROBIC BOTTLE ONLY VRE HAVE INTRINSIC RESISTANCE TO MOST COMMONLY USED ANTIBIOTICS AND THE ABILITY TO ACQUIRE RESISTANCE TO MOST AVAILABLE ANTIBIOTICS. CRITICAL RESULT CALLED TO, READ BACK BY AND VERIFIED WITH: CHERYL SMITH AT [id number] 09/19/15 DV    Report Status 09/21/2015 FINAL  Final   Organism ID, Bacteria ENTEROCOCCUS FAECALIS  Final       Susceptibility   Enterococcus faecalis - MIC*    AMPICILLIN <=2 SENSITIVE Sensitive     LINEZOLID 2 SENSITIVE Sensitive     CIPROFLOXACIN Value in next row Resistant      RESISTANT>=8    TETRACYCLINE Value in next row Resistant      RESISTANT>=16    VANCOMYCIN Value in next row Resistant      RESISTANT>=32    GENTAMICIN SYNERGY Value in next row Resistant      RESISTANT>=32    * ENTEROCOCCUS FAECALIS  Culture, respiratory (NON-Expectorated)  Status: None   Collection Time: 09/16/15  3:50 PM  Result Value Ref Range Status   Specimen Description TRACHEAL ASPIRATE  Final   Special Requests Immunocompromised  Final   Gram Stain   Final    FEW WBC SEEN GOOD SPECIMEN - 80-90% WBCS RARE GRAM NEGATIVE RODS    Culture LIGHT GROWTH PSEUDOMONAS AERUGINOSA  Final   Report Status 09/19/2015 FINAL  Final   Organism ID, Bacteria PSEUDOMONAS AERUGINOSA  Final      Susceptibility   Pseudomonas aeruginosa - MIC*    CEFTAZIDIME 8 SENSITIVE Sensitive     CIPROFLOXACIN 2 INTERMEDIATE Intermediate     GENTAMICIN >=16 RESISTANT Resistant     IMIPENEM >=16 RESISTANT Resistant     PIP/TAZO Value in next row Sensitive      SENSITIVE32    CEFEPIME Value in next row Sensitive      SENSITIVE8    LEVOFLOXACIN Value in next row Resistant      RESISTANT>=8    * LIGHT GROWTH PSEUDOMONAS AERUGINOSA  Culture, expectorated sputum-assessment     Status: None   Collection Time: 09/22/15  2:09 PM  Result Value Ref Range Status   Specimen Description ENDOTRACHEAL  Final   Special Requests Normal  Final   Sputum evaluation THIS SPECIMEN IS ACCEPTABLE FOR SPUTUM CULTURE  Final   Report Status 09/24/2015 FINAL  Final  Culture, respiratory (NON-Expectorated)     Status: None   Collection Time: 09/22/15  2:09 PM  Result Value Ref Range Status   Specimen Description ENDOTRACHEAL  Final   Special Requests Normal Reflexed from [id number]  Final   Gram Stain   Final    FEW WBC SEEN FEW GRAM NEGATIVE RODS POOR  SPECIMEN - LESS THAN 70% WBCS    Culture   Final    MODERATE GROWTH PSEUDOMONAS AERUGINOSA LIGHT GROWTH KLEBSIELLA PNEUMONIAE REFER TO SENSITIVITIES FROM PREVIOUS CULTURE FOR ORG 2    Report Status 10/01/2015 FINAL  Final   Organism ID, Bacteria PSEUDOMONAS AERUGINOSA  Final   Organism ID, Bacteria KLEBSIELLA PNEUMONIAE  Final      Susceptibility   Pseudomonas aeruginosa - MIC*    CEFTAZIDIME 4 SENSITIVE Sensitive     CIPROFLOXACIN 2 INTERMEDIATE Intermediate     GENTAMICIN >=16 RESISTANT Resistant     IMIPENEM >=16 RESISTANT Resistant     PIP/TAZO Value in next row Sensitive      SENSITIVE16    * MODERATE GROWTH PSEUDOMONAS AERUGINOSA  Tissue culture     Status: None   Collection Time: 09/24/15  6:44 AM  Result Value Ref Range Status   Specimen Description BONE  Final   Special Requests Normal  Final   Gram Stain MODERATE WBC SEEN FEW GRAM NEGATIVE RODS   Final   Culture   Final    MODERATE GROWTH ESCHERICHIA COLI MODERATE GROWTH KLEBSIELLA PNEUMONIAE ESBL-EXTENDED SPECTRUM BETA LACTAMASE-THE ORGANISM IS RESISTANT TO PENICILLINS, CEPHALOSPORINS AND AZTREONAM ACCORDING TO CLSI [id number] VOL.25 N01 JAN 2005. ORGANISM 1 This organism isolate is resistant to one or more antiotic agents in three or more antimicrobial categories.  Suggest Infectious Disease consult.   ORGANISM 2 CRITICAL RESULT CALLED TO, READ BACK BY AND VERIFIED WITH: RN Mardene Celeste LINDSAY 09/27/15 1005AM    Report Status 09/28/2015 FINAL  Final   Organism ID, Bacteria ESCHERICHIA COLI  Final   Organism ID, Bacteria KLEBSIELLA PNEUMONIAE  Final      Susceptibility   Escherichia coli - MIC*    AMPICILLIN >=32 RESISTANT Resistant  CEFTAZIDIME 4 RESISTANT Resistant     CEFAZOLIN >=64 RESISTANT Resistant     CEFTRIAXONE 8 RESISTANT Resistant     GENTAMICIN <=1 SENSITIVE Sensitive     IMIPENEM 8 RESISTANT Resistant     TRIMETH/SULFA <=20 SENSITIVE Sensitive     Extended ESBL POSITIVE Resistant     PIP/TAZO Value  in next row Resistant      RESISTANT>=128    * MODERATE GROWTH ESCHERICHIA COLI   Klebsiella pneumoniae - MIC*    AMPICILLIN Value in next row Resistant      RESISTANT>=128    CEFTAZIDIME Value in next row Resistant      RESISTANT>=128    CEFAZOLIN Value in next row Resistant      RESISTANT>=128    CEFTRIAXONE Value in next row Resistant      RESISTANT>=128    CIPROFLOXACIN Value in next row Resistant      RESISTANT>=128    GENTAMICIN Value in next row Resistant      RESISTANT>=128    IMIPENEM Value in next row Resistant      RESISTANT>=128    TRIMETH/SULFA Value in next row Sensitive      RESISTANT>=128    PIP/TAZO Value in next row Resistant      RESISTANT>=128    * MODERATE GROWTH KLEBSIELLA PNEUMONIAE  Anaerobic culture     Status: None   Collection Time: 09/24/15  3:55 PM  Result Value Ref Range Status   Specimen Description BONE  Final   Special Requests Normal  Final   Culture NO ANAEROBES ISOLATED  Final   Report Status 09/29/2015 FINAL  Final  C difficile quick scan w PCR reflex     Status: None   Collection Time: 10/07/15  2:02 PM  Result Value Ref Range Status   C Diff antigen NEGATIVE NEGATIVE Final   C Diff toxin NEGATIVE NEGATIVE Final   C Diff interpretation Negative for C. difficile  Final    Coagulation Studies: No results for input(s): LABPROT, INR in the last 72 hours.  Urinalysis: No results for input(s): COLORURINE, LABSPEC, PHURINE, GLUCOSEU, HGBUR, BILIRUBINUR, KETONESUR, PROTEINUR, UROBILINOGEN, NITRITE, LEUKOCYTESUR in the last 72 hours.  Invalid input(s): APPERANCEUR    Imaging: No results found.   Medications:   . feeding supplement (VITAL AF 1.2 CAL) 1,000 mL (10/11/15 1827)  . norepinephrine Stopped (10/08/15 0530)   . [START ON 10/17/2015] amantadine  200 mg Per Tube Q7 days  . antiseptic oral rinse  7 mL Mouth Rinse QID  . aspirin  81 mg Oral Daily  . calcium acetate (Phos Binder)  667 mg Oral TID WC  . chlorhexidine gluconate   15 mL Mouth Rinse BID  . epoetin (EPOGEN/PROCRIT) injection  10,000 Units Intravenous Q M,W,F-HD  . feeding supplement (PRO-STAT SUGAR FREE 64)  30 mL Oral 6 X Daily  . folic acid  1 mg Oral Daily  . free water  20 mL Per Tube 6 times per day  . heparin subcutaneous  5,000 Units Subcutaneous Q12H  . hydrocortisone  10 mg Per Tube BID  . insulin aspart  0-9 Units Subcutaneous 6 times per day  . ipratropium-albuterol  3 mL Nebulization QID  . lidocaine  1 patch Transdermal Q24H  . loperamide  2 mg Oral Daily  . midodrine  5 mg Per Tube TID WC  . multivitamin  5 mL Oral Daily  . pantoprazole sodium  40 mg Per Tube Daily  . sodium chloride  10-40 mL Intracatheter Q12H  .  sulfamethoxazole-trimethoprim  2.5 tablet Per Tube q1800  . thiamine IV  100 mg Intravenous Daily   sodium chloride, sodium chloride, acetaminophen (TYLENOL) oral liquid 160 mg/5 mL, HYDROmorphone (DILAUDID) injection, morphine injection, ondansetron (ZOFRAN) IV, oxyCODONE, sodium chloride, zinc oxide  Assessment/ Plan:  49 y.o. 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 Admitted on 08/04/2015  1. End-stage renal disease on hemodialysis on HD MWF. The patient has been on dialysis since October of 2014. R IJ permcath. Dialysis treatment with IV albumin for oncotic support.  - BUN noted to be high - high risk of hypotension during dialysis  - slow BFR to  prevent hypotension. Also use low temp, iv albumin - Patient seen during dialysis Tolerating well   2. Anemia of CKD: hgb down to 8.0, continue epogen with HD.  3. Secondary hyperparathyroidism: PTH low at 55 (9/25) -phos 5.1 at last check,low dose phoslyra   4. Sepsis: VRE in blood. Bone cultures E. Coli and klebsiella - Treatment as per ID and ICU team: daptomycin completed on 11/10 and now on trimethoprim/sulfamethoxazole.   5. Generalized Dependent edema/Anasarca due to 3rd spacing -  Not able to take much fluid off due to intradialytic hypotention  6. Acute resp failure - back on ventilator .  LOS: 61 Courtney [name] 11/23/20169:06 AM

## 2015-10-12 NOTE — Progress Notes (Signed)
Post Dialysis Assessment.   

## 2015-10-12 NOTE — Progress Notes (Signed)
PT Cancellation Note  Patient Details Name: Lisette GrinderHedda McMillian Monroe MRN: [id number] DOB: Apr 02, 1966   Cancelled Treatment:    Reason Eval/Treat Not Completed: Patient at procedure or test/unavailable (Patient currently receiving dialysis and unavailable for participation with session.  Will re-attempt at later time/date as patient available and medically appropriate.)   Socorro Kanitz H. Manson PasseyBrown, PT, DPT, NCS 10/12/2015, 8:48 AM [phone]

## 2015-10-12 NOTE — Progress Notes (Signed)
Key Points: Use following P&T approved IV to PO antibiotic change policy.  Description contains the criteria that are approved Note: Policy Excludes:  Esophagectomy patientsPHARMACIST - PHYSICIAN COMMUNICATION DR:   [name] CONCERNING: IV to Oral Route Change Policy  RECOMMENDATION: This patient is receiving thiamine by the intravenous route.  Based on criteria approved by the Pharmacy and Therapeutics Committee, the intravenous medication(s) is/are being converted to the equivalent oral dose form(s).   DESCRIPTION: These criteria include:  The patient is eating (either orally or via tube) and/or has been taking other orally administered medications for a least 24 hours  The patient has no evidence of active gastrointestinal bleeding or impaired GI absorption (gastrectomy, short bowel, patient on TNA or NPO).  If you have questions about this conversion, please contact the Pharmacy Department  []   [phone]( [phone] )  [name] [x]   [phone]( [phone] )  [facility] []   [phone]( [phone] )  [facility] []   [phone] )  [facility] []   [phone]( [phone] )  [facility]   [name], Tajae Rybicki D, [facility] HospitalRPH 10/12/2015 12:10 PM

## 2015-10-12 NOTE — Progress Notes (Signed)
HD tx start 

## 2015-10-12 NOTE — Progress Notes (Signed)
MEDICATION RELATED CONSULT NOTE - INITIAL   Pharmacy Consult for Renal dosing  Allergies  Allergen Reactions  . Contrast Media [Iodinated Diagnostic Agents] Anaphylaxis  . Ampicillin Rash    Patient Measurements: Height: 5\' 9"  (175.3 cm) Weight:  (Bedscale broken) IBW/kg (Calculated) : 66.2  Vital Signs: Temp: 97.3 F (36.3 C) (11/23 1015) Temp Source: Axillary (11/23 1015) BP: 101/45 mmHg (11/23 1100) Pulse Rate: 116 (11/23 1100) Intake/Output from previous day: 11/22 0701 - 11/23 0700 In: 1600 [NG/GT:1600] Out: 0  Intake/Output from this shift: Total I/O In: 310 [NG/GT:310] Out: 501 [Other:500; Stool:1]  Labs:  Recent Labs  10/10/15 0613 10/12/15 0636  WBC 7.7 8.7  HGB 8.4* 8.0*  HCT 26.6* 25.9*  PLT 195 190  CREATININE 1.63* 1.42*  PHOS  --  5.1*  ALBUMIN  --  2.2*   Estimated Creatinine Clearance: 58.7 mL/min (by C-G formula based on Cr of 1.42).   Microbiology: Recent Results (from the past 720 hour(s))  C difficile quick scan w PCR reflex     Status: None   Collection Time: 09/13/15 12:51 AM  Result Value Ref Range Status   C Diff antigen NEGATIVE NEGATIVE Final   C Diff toxin NEGATIVE NEGATIVE Final   C Diff interpretation Negative for C. difficile  Final  Culture, blood (routine x 2)     Status: None   Collection Time: 09/16/15 11:24 AM  Result Value Ref Range Status   Specimen Description BLOOD LEFT HAND  Final   Special Requests BOTTLES DRAWN AEROBIC AND ANAEROBIC  1CC  Final   Culture NO GROWTH 5 DAYS  Final   Report Status 09/21/2015 FINAL  Final  Culture, blood (routine x 2)     Status: None   Collection Time: 09/16/15 12:23 PM  Result Value Ref Range Status   Specimen Description BLOOD RIGHT HAND  Final   Special Requests BOTTLES DRAWN AEROBIC AND ANAEROBIC  1CC  Final   Culture  Setup Time   Final    GRAM POSITIVE COCCI AEROBIC BOTTLE ONLY CRITICAL RESULT CALLED TO, READ BACK BY AND VERIFIED WITH: TESS THOMAS,RN 09/17/2015 0631 BY  JRS.    Culture   Final    ENTEROCOCCUS FAECALIS AEROBIC BOTTLE ONLY VRE HAVE INTRINSIC RESISTANCE TO MOST COMMONLY USED ANTIBIOTICS AND THE ABILITY TO ACQUIRE RESISTANCE TO MOST AVAILABLE ANTIBIOTICS. CRITICAL RESULT CALLED TO, READ BACK BY AND VERIFIED WITH: CHERYL SMITH AT [id number] 09/19/15 DV    Report Status 09/21/2015 FINAL  Final   Organism ID, Bacteria ENTEROCOCCUS FAECALIS  Final      Susceptibility   Enterococcus faecalis - MIC*    AMPICILLIN <=2 SENSITIVE Sensitive     LINEZOLID 2 SENSITIVE Sensitive     CIPROFLOXACIN Value in next row Resistant      RESISTANT>=8    TETRACYCLINE Value in next row Resistant      RESISTANT>=16    VANCOMYCIN Value in next row Resistant      RESISTANT>=32    GENTAMICIN SYNERGY Value in next row Resistant      RESISTANT>=32    * ENTEROCOCCUS FAECALIS  Culture, respiratory (NON-Expectorated)     Status: None   Collection Time: 09/16/15  3:50 PM  Result Value Ref Range Status   Specimen Description TRACHEAL ASPIRATE  Final   Special Requests Immunocompromised  Final   Gram Stain   Final    FEW WBC SEEN GOOD SPECIMEN - 80-90% WBCS RARE GRAM NEGATIVE RODS    Culture LIGHT GROWTH PSEUDOMONAS AERUGINOSA  Final   Report Status 09/19/2015 FINAL  Final   Organism ID, Bacteria PSEUDOMONAS AERUGINOSA  Final      Susceptibility   Pseudomonas aeruginosa - MIC*    CEFTAZIDIME 8 SENSITIVE Sensitive     CIPROFLOXACIN 2 INTERMEDIATE Intermediate     GENTAMICIN >=16 RESISTANT Resistant     IMIPENEM >=16 RESISTANT Resistant     PIP/TAZO Value in next row Sensitive      SENSITIVE32    CEFEPIME Value in next row Sensitive      SENSITIVE8    LEVOFLOXACIN Value in next row Resistant      RESISTANT>=8    * LIGHT GROWTH PSEUDOMONAS AERUGINOSA  Culture, expectorated sputum-assessment     Status: None   Collection Time: 09/22/15  2:09 PM  Result Value Ref Range Status   Specimen Description ENDOTRACHEAL  Final   Special Requests Normal  Final   Sputum  evaluation THIS SPECIMEN IS ACCEPTABLE FOR SPUTUM CULTURE  Final   Report Status 09/24/2015 FINAL  Final  Culture, respiratory (NON-Expectorated)     Status: None   Collection Time: 09/22/15  2:09 PM  Result Value Ref Range Status   Specimen Description ENDOTRACHEAL  Final   Special Requests Normal Reflexed from [id number]  Final   Gram Stain   Final    FEW WBC SEEN FEW GRAM NEGATIVE RODS POOR SPECIMEN - LESS THAN 70% WBCS    Culture   Final    MODERATE GROWTH PSEUDOMONAS AERUGINOSA LIGHT GROWTH KLEBSIELLA PNEUMONIAE REFER TO SENSITIVITIES FROM PREVIOUS CULTURE FOR ORG 2    Report Status 10/01/2015 FINAL  Final   Organism ID, Bacteria PSEUDOMONAS AERUGINOSA  Final   Organism ID, Bacteria KLEBSIELLA PNEUMONIAE  Final      Susceptibility   Pseudomonas aeruginosa - MIC*    CEFTAZIDIME 4 SENSITIVE Sensitive     CIPROFLOXACIN 2 INTERMEDIATE Intermediate     GENTAMICIN >=16 RESISTANT Resistant     IMIPENEM >=16 RESISTANT Resistant     PIP/TAZO Value in next row Sensitive      SENSITIVE16    * MODERATE GROWTH PSEUDOMONAS AERUGINOSA  Tissue culture     Status: None   Collection Time: 09/24/15  6:44 AM  Result Value Ref Range Status   Specimen Description BONE  Final   Special Requests Normal  Final   Gram Stain MODERATE WBC SEEN FEW GRAM NEGATIVE RODS   Final   Culture   Final    MODERATE GROWTH ESCHERICHIA COLI MODERATE GROWTH KLEBSIELLA PNEUMONIAE ESBL-EXTENDED SPECTRUM BETA LACTAMASE-THE ORGANISM IS RESISTANT TO PENICILLINS, CEPHALOSPORINS AND AZTREONAM ACCORDING TO CLSI [id number] VOL.25 N01 JAN 2005. ORGANISM 1 This organism isolate is resistant to one or more antiotic agents in three or more antimicrobial categories.  Suggest Infectious Disease consult.   ORGANISM 2 CRITICAL RESULT CALLED TO, READ BACK BY AND VERIFIED WITH: RN Mardene Celeste LINDSAY 09/27/15 1005AM    Report Status 09/28/2015 FINAL  Final   Organism ID, Bacteria ESCHERICHIA COLI  Final   Organism ID, Bacteria KLEBSIELLA  PNEUMONIAE  Final      Susceptibility   Escherichia coli - MIC*    AMPICILLIN >=32 RESISTANT Resistant     CEFTAZIDIME 4 RESISTANT Resistant     CEFAZOLIN >=64 RESISTANT Resistant     CEFTRIAXONE 8 RESISTANT Resistant     GENTAMICIN <=1 SENSITIVE Sensitive     IMIPENEM 8 RESISTANT Resistant     TRIMETH/SULFA <=20 SENSITIVE Sensitive     Extended ESBL POSITIVE Resistant     PIP/TAZO  Value in next row Resistant      RESISTANT>=128    * MODERATE GROWTH ESCHERICHIA COLI   Klebsiella pneumoniae - MIC*    AMPICILLIN Value in next row Resistant      RESISTANT>=128    CEFTAZIDIME Value in next row Resistant      RESISTANT>=128    CEFAZOLIN Value in next row Resistant      RESISTANT>=128    CEFTRIAXONE Value in next row Resistant      RESISTANT>=128    CIPROFLOXACIN Value in next row Resistant      RESISTANT>=128    GENTAMICIN Value in next row Resistant      RESISTANT>=128    IMIPENEM Value in next row Resistant      RESISTANT>=128    TRIMETH/SULFA Value in next row Sensitive      RESISTANT>=128    PIP/TAZO Value in next row Resistant      RESISTANT>=128    * MODERATE GROWTH KLEBSIELLA PNEUMONIAE  Anaerobic culture     Status: None   Collection Time: 09/24/15  3:55 PM  Result Value Ref Range Status   Specimen Description BONE  Final   Special Requests Normal  Final   Culture NO ANAEROBES ISOLATED  Final   Report Status 09/29/2015 FINAL  Final  C difficile quick scan w PCR reflex     Status: None   Collection Time: 10/07/15  2:02 PM  Result Value Ref Range Status   C Diff antigen NEGATIVE NEGATIVE Final   C Diff toxin NEGATIVE NEGATIVE Final   C Diff interpretation Negative for C. difficile  Final    Medical History: Past Medical History  Diagnosis Date  . Obesity   . Dyslipidemia   . Hypertension   . Coronary artery disease     s/p BMS 2010 LAD  . Dysrhythmia     ventricular tachycardia resolved after LAD stent and beta blocker  . Diabetes mellitus     with  retinopathy, neuropathy and microalbuminemia  . ESRD (end stage renal disease) on dialysis     Assessment: 49 yo patient on HD. Pharmacy consulted for renal dosing of medications.   Plan:  No medications require adjustment at present. Pharmacy will continue to monitor patients medications and dose adjust as needed.    Luisa Hart, PharmD 10/12/2015 12:05 PM

## 2015-10-12 NOTE — Progress Notes (Signed)
HD Tx Termination 

## 2015-10-12 NOTE — Therapy (Signed)
[facility] increased WOB and tachypnea while on dialysis. RR in PS/PEEEP 32 with accessory muscle use. RR decreased to 18 in PRVC. Patient appears to be resting comfortably at present.

## 2015-10-13 LAB — GLUCOSE, CAPILLARY
GLUCOSE-CAPILLARY: 100 mg/dL — AB (ref 65–99)
GLUCOSE-CAPILLARY: 131 mg/dL — AB (ref 65–99)
GLUCOSE-CAPILLARY: 135 mg/dL — AB (ref 65–99)
GLUCOSE-CAPILLARY: 154 mg/dL — AB (ref 65–99)
Glucose-Capillary: 124 mg/dL — ABNORMAL HIGH (ref 65–99)

## 2015-10-13 LAB — HEPATITIS B SURFACE ANTIGEN: Hepatitis B Surface Ag: NEGATIVE

## 2015-10-13 NOTE — Progress Notes (Signed)
Rested well during shift. Sinus tach with PVCs.  Remained on vent on rate control during shift. Tolerating tube feeds. Husband visited this afternoon and parents visited this evening.

## 2015-10-13 NOTE — Progress Notes (Addendum)
[facility] [place] [facility] Medicine Progess Note  Name: Courtney [name] MRN: [id number] DOB: 06/11/1966    ADMISSION DATE:  08/11/2015     INITIAL PRESENTATION:  49 AAF who has been in medical facilities (hosp, LTAC, rehab) for 2 yrs following gastric bypass surgery with multiple complications. Now with chronic trach, ESRD, profound debilitation, severe sacral pressure ulcer. Was seemingly making progress and transferred to rehab facility approx one week prior to this admission. She was sent to [facility] ED with AMS and hypotension. Working dx of severe sepsis/septic shock due to infected sacral pressure ulcer. Since admission. Her course is been very complicated with numerous complications including septic shock and GI bleeding.    INTERVAL HISTORY:  The patient is off the ventilator this morning, she is back on trach collar. She is awake and alert and has no particular complaints. The patient's husband is in the room. He was updated on the patient's care. Our notes, the patient continued to have diarrhea overnight, patient has concerns about the patient's diarrhea and his tube feeds. When necessary asked me about potentially placing a rectal tube.    Subjective: 11/24 >> awake and following commands, tolerating PRVC, will attempt PSV mode and cont with current medical plan. AM labs.   MAJOR EVENTS/TEST RESULTS: Admission 02/07/14-05/07/14 Admission 07/21/14-09/06/14 Discharged to [facility]. Pt had palliative consult at that time, were asked to sign off by husband.  09/23 CT head: NAD 09/23 EEG: no epileptiform activity 09/23 PRBCs for Hgb 6.4 09/24 bedside debridement of sacral wound. Abscess drained 09/25 Off vasopressors. More alert. No distress. Worsening thrombocytopenia. Vanc DC'd 09/29  Dr. Sampson Goon (I.D) excused from the case by patient's husband. 10/02 MRI -multiple infarcts 10/03 tracheal bleeding- transfused platelets 10/03 hospitalist service excused from the case by  patient's husband 10/03 Echocardiogram ejection fraction was 55-60%, pulmonary systolic pressure was 39 mmHg 78/29 restart TF's at lower rate, attempt reg HD  10/12 Transferred to med-surg floor. Remains on PCCM service 10/14 SLP eval: pt unable to tolerate PMV adequately 10/17-will re-attempt PM valve-discussed with Speech therapist 10/18 passed swallow eval-start pureed thick foods no thin liquids-continue NG feeds 10/19 transferred to step down for sepsis/aspiration pneumonia 10/19 cxr shows RLL opacity 10/21 sacral decub debride at bedside by surgery 10/22 started back on vasopressors while on HD 10/27 CT with osteo, R hip fx, unable to identify tip of dubhoff tube - sent for fluoro study 10/28 Ortho consultation: I do not feel that she is a surgical candidate. Therefore, I feel that it would best to manage this fracture nonsurgically and allow it to heal by itself over time, which it should.  10/28 Gen Surg consultation: Due to the lack of free air or free flow of contrast and the peritoneum there is no indication for any surgical intervention on this. Would recommend pulling back the feeding tube 1-2 cm. Would recheck an abdominal film to confirm no pneumoperitoneum in the morning. Absence of any changes okay to continue using Dobbhoff for feeding and medications. 10/28 gastrograffin study: The study confirms that the feeding tube pes perforated through the duodenum and the tip is within a cavity that fills with injected contrast. The cavity does appear walled off 11/4 refuses oral feeds, continue TF's CT reviewed: T8-T9 discitis/osteomyelitis, RT HIP FRACTURE 11/5 placed back on vasopressors, placed back on Vent due to resp acidosis, levophed turned off 11/6 afternoon 11/5 PM Trach changed out due to cuff leak, #6 Shiley cuffed, 11/6 dubhoff occluded, removed and replaced, new dubhoff shows  tube in the antral stomach 11/15 refusing to take oral feeds 11/18 placed back on Vent for increased  WOB,SOB  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 Pneumoniae, P. Mirabilis, VRE Wound 9/26 >> No growth Blood 9/23 >> NEG CDiff 9/27>>neg Stool Cx 10/15>> negative Cdiff 10/25>>neg Trach Aspirate 10/28>> light growth pseudomonas Blood 10/28 >> 1/2 GPC >>  Sputum cultures obtained 09/22/15 due to mucus plugging>>Pseudomonas BONE TISSUE Cx 11/3>>E.coli (ESBL), k. Pneumonia (daptomycin and septra)  ANTIMICROBIALS:  Aztreonam 9/23 >> 9/24 Vanc 9/23 >> 9/25 Vanc 9/26>>9/27 Daptomycin 9/27>> 10/12, 10/28>>  Meropenem 9/23 >> 10/14, 10/28 >> 10/31 Septra 11/8>>   VITAL SIGNS: Temp:  [97.5 F (36.4 C)-99.5 F (37.5 C)] 99.4 F (37.4 C) (11/24 0800) Pulse Rate:  [107-121] 117 (11/24 1000) Resp:  [17-31] 31 (11/24 1000) BP: (94-140)/(41-104) 101/85 mmHg (11/24 1000) SpO2:  [94 %-100 %] 97 % (11/24 1000) FiO2 (%):  [30 %] 30 % (11/24 0800) HEMODYNAMICS:   VENTILATOR SETTINGS: Vent Mode:  [-] PRVC FiO2 (%):  [30 %] 30 % Set Rate:  [15 bmp] 15 bmp Vt Set:  [500 mL] 500 mL PEEP:  [5 cmH20] 5 cmH20 INTAKE / OUTPUT:  Intake/Output Summary (Last 24 hours) at 10/13/15 1108 Last data filed at 10/13/15 1000  Gross per 24 hour  Intake   1830 ml  Output      2 ml  Net   1828 ml    PHYSICAL EXAMINATION: Physical Examination:   VS: BP 101/85 mmHg  Pulse 117  Temp(Src) 99.4 F (37.4 C) (Oral)  Resp 31  Ht 5\' 9"  (1.753 m)  Wt 213 lb 13.5 oz (97 kg)  BMI 31.57 kg/m2  SpO2 97%  LMP  (LMP Unknown)  General Appearance: No resp distress, coarse upper airway sounds, off vent, on TCT Neuro: profoundly diffusely weak, alert  today, nodding head HEENT: cushingoid facies, PERRLA, EOM intact Neck: trach site clean Pulmonary: clear anteriorly Cardiovascular: reg, + syst murmur Abdomen: soft, NT, +BS Extremities: warm, R>L UE edema Examination of the sacral decubitus ulcer- showed a very deep ulcer approximately 7-8 cm across.   stage IV ulcer.   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 Diff Stool incontinence - husband has refused to allow rectal tube placement Acute on chronic anemia Thrombocytopenia, resolved - HIT panel negative 9/25 Recurrent Severe sepsis, due to sacral pressure ulcer ID following  DM 2, controlled.  Chronic steroid therapy, for a history of of adrenal insufficiency Incidental finding of R hip fx - conservative mgmt. Waxing and waning encephalopathy - she was evaluated by both the psychiatry and neurology services, and it was deemed that the patient lacks decisional capacity at this time. 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 Husband declined TEE Profound deconditioning-criticall illness neuropathy Chronic pain  Cont vent support - settings reviewed and/or adjusted Wean in PSV <> ATC as tolerated Cont vent bundle Daily SBT if/when meets criteria Monitor BMET intermittently Monitor I/Os Correct electrolytes as indicated Intermittent HD per Renal service Cont TFs as tolerated Cont SUP Cont DVT px Cont TMP-SMX per ID service DVT px: SQ heparin Monitor CBC intermittently Transfuse per usual ICU guidelines Cont analgesics PRN Cont to work on [employer] placement   Husband updated via  telephone   I have personally obtained a history, examined the patient, evaluated laboratory and imaging results, formulated the assessment and plan and placed orders. CRITICAL CARE: The patient is critically ill with multiple organ systems failure and requires high complexity decision making for assessment and support, frequent evaluation and titration of therapies, application of advanced monitoring technologies and extensive interpretation of multiple databases.   Critical Care Time devoted to patient care services described in this note is 35 minutes.    Stephanie Acre, MD [facility] Pager [phone] (please enter 7-digits) On Call Pager - [phone] (please enter 7-digits)

## 2015-10-13 NOTE — Progress Notes (Signed)
Subjective:  Patient currently on the ventilator. Had hemodialysis yesterday. Care management notes also reviewed. Patient remains full CODE STATUS at this time.   Objective:  Vital signs in last 24 hours:  Temp:  [97.3 F (36.3 C)-99.5 F (37.5 C)] 99.4 F (37.4 C) (11/24 0800) Pulse Rate:  [99-119] 113 (11/24 0700) Resp:  [16-27] 20 (11/24 0700) BP: (94-140)/(37-118) 122/50 mmHg (11/24 0800) SpO2:  [93 %-100 %] 95 % (11/24 0700) FiO2 (%):  [30 %] 30 % (11/24 0800)  Weight change:  Filed Weights    Intake/Output: I/O last 3 completed shifts: In: 2410 [NG/GT:2410] Out: 503 [Other:500; Stool:3]   Intake/Output this shift:     Physical Exam: General: No acute distress, chronically ill appearing  Head: NG in place, OM moist  Eyes: anicteric  Neck: Tracheostomy in place, vent assisted  Lungs:  Scattered rhonchi, normal effort  Heart: S1S2 +tachycardia  Abdomen:  Soft, nontender  Extremities: +++ dependent edema, anasarca  Neurologic: Lethargic but arousable  Access:  R IJ permcath.       Basic Metabolic Panel:  Recent Labs Lab 10/07/15 1600 10/09/15 0506 10/10/15 0613 10/12/15 0636  NA 141 139 141 142  K 4.3 4.7 5.2* 4.7  CL 100* 100* 99* 101  CO2 34* 32 31 32  GLUCOSE 146* 123* 81 136*  BUN 64* 91* 100* 84*  CREATININE 0.98 1.35* 1.63* 1.42*  CALCIUM 8.4* 8.4* 8.5* 8.5*  PHOS 3.4  --   --  5.1*    Liver Function Tests:  Recent Labs Lab 10/07/15 1600 10/12/15 0636  ALBUMIN 2.6* 2.2*   No results for input(s): LIPASE, AMYLASE in the last 168 hours. No results for input(s): AMMONIA in the last 168 hours.  CBC:  Recent Labs Lab 10/07/15 1600 10/09/15 0506 10/10/15 0613 10/12/15 0636  WBC 11.7* 9.1 7.7 8.7  HGB 8.8* 8.0* 8.4* 8.0*  HCT 28.5* 25.7* 26.6* 25.9*  MCV 103.4* 102.7* 101.3* 102.5*  PLT 201 191 195 190    Cardiac Enzymes: No results for input(s): CKTOTAL, CKMB, CKMBINDEX, TROPONINI in the last 168 hours.  BNP: Invalid  input(s): POCBNP  CBG:  Recent Labs Lab 10/12/15 1626 10/12/15 1940 10/12/15 2345 10/13/15 0346 10/13/15 0736  GLUCAP 135* 120* 143* 100* 124*    Microbiology: Results for orders placed or performed during the hospital encounter of 08/11/2015  Blood Culture (routine x 2)     Status: None   Collection Time: 08/01/2015  8:51 AM  Result Value Ref Range Status   Specimen Description BLOOD [name]  Final   Special Requests BOTTLES DRAWN AEROBIC AND ANAEROBIC  3CC  Final   Culture NO GROWTH 5 DAYS  Final   Report Status 08/17/2015 FINAL  Final  Blood Culture (routine x 2)     Status: None   Collection Time: 07/31/2015  9:20 AM  Result Value Ref Range Status   Specimen Description BLOOD LEFT ARM  Final   Special Requests BOTTLES DRAWN AEROBIC AND ANAEROBIC  1CC  Final   Culture NO GROWTH 5 DAYS  Final   Report Status 08/17/2015 FINAL  Final  Wound culture     Status: None   Collection Time: 07/28/2015  9:20 AM  Result Value Ref Range Status   Specimen Description DECUBITIS  Final   Special Requests Normal  Final   Gram Stain   Final    FEW WBC SEEN MANY GRAM NEGATIVE RODS RARE GRAM POSITIVE COCCI    Culture   Final  HEAVY GROWTH ESCHERICHIA COLI MODERATE GROWTH ENTEROBACTER AEROGENES PROTEUS MIRABILIS HEAVY GROWTH ENTEROCOCCUS SPECIES VRE HAVE INTRINSIC RESISTANCE TO MOST COMMONLY USED ANTIBIOTICS AND THE ABILITY TO ACQUIRE RESISTANCE TO MOST AVAILABLE ANTIBIOTICS.    Report Status 08/16/2015 FINAL  Final   Organism ID, Bacteria ESCHERICHIA COLI  Final   Organism ID, Bacteria ENTEROBACTER AEROGENES  Final   Organism ID, Bacteria PROTEUS MIRABILIS  Final   Organism ID, Bacteria ENTEROCOCCUS SPECIES  Final      Susceptibility   Enterobacter aerogenes - MIC*    CEFTAZIDIME <=1 SENSITIVE Sensitive     CEFAZOLIN >=64 RESISTANT Resistant     CEFTRIAXONE <=1 SENSITIVE Sensitive     CIPROFLOXACIN <=0.25 SENSITIVE Sensitive     GENTAMICIN <=1 SENSITIVE Sensitive     IMIPENEM 1  SENSITIVE Sensitive     TRIMETH/SULFA <=20 SENSITIVE Sensitive     * MODERATE GROWTH ENTEROBACTER AEROGENES   Escherichia coli - MIC*    AMPICILLIN <=2 SENSITIVE Sensitive     CEFTAZIDIME <=1 SENSITIVE Sensitive     CEFAZOLIN <=4 SENSITIVE Sensitive     CEFTRIAXONE <=1 SENSITIVE Sensitive     CIPROFLOXACIN <=0.25 SENSITIVE Sensitive     GENTAMICIN <=1 SENSITIVE Sensitive     IMIPENEM <=0.25 SENSITIVE Sensitive     TRIMETH/SULFA <=20 SENSITIVE Sensitive     * HEAVY GROWTH ESCHERICHIA COLI   Proteus mirabilis - MIC*    AMPICILLIN >=32 RESISTANT Resistant     CEFTAZIDIME <=1 SENSITIVE Sensitive     CEFAZOLIN 8 SENSITIVE Sensitive     CEFTRIAXONE <=1 SENSITIVE Sensitive     CIPROFLOXACIN <=0.25 SENSITIVE Sensitive     GENTAMICIN <=1 SENSITIVE Sensitive     IMIPENEM 1 SENSITIVE Sensitive     TRIMETH/SULFA <=20 SENSITIVE Sensitive     * PROTEUS MIRABILIS   Enterococcus species - MIC*    AMPICILLIN >=32 RESISTANT Resistant     VANCOMYCIN >=32 RESISTANT Resistant     GENTAMICIN SYNERGY SENSITIVE Sensitive     TETRACYCLINE Value in next row Resistant      RESISTANT>=16    * HEAVY GROWTH ENTEROCOCCUS SPECIES  MRSA PCR Screening     Status: None   Collection Time: 08/13/2015  2:38 PM  Result Value Ref Range Status   MRSA by PCR NEGATIVE NEGATIVE Final    Comment:        The GeneXpert MRSA Assay (FDA approved for NASAL specimens only), is one component of a comprehensive MRSA colonization surveillance program. It is not intended to diagnose MRSA infection nor to guide or monitor treatment for MRSA infections.   Blood culture (single)     Status: None   Collection Time: 07/23/2015  3:36 PM  Result Value Ref Range Status   Specimen Description BLOOD RIGHT ASSIST CONTROL  Final   Special Requests BOTTLES DRAWN AEROBIC AND ANAEROBIC 5ML  Final   Culture NO GROWTH 5 DAYS  Final   Report Status 08/17/2015 FINAL  Final  Wound culture     Status: None   Collection Time: 08/13/15 12:37 PM   Result Value Ref Range Status   Specimen Description WOUND  Final   Special Requests Normal  Final   Gram Stain   Final    FEW WBC SEEN TOO NUMEROUS TO COUNT GRAM NEGATIVE RODS FEW GRAM POSITIVE COCCI    Culture   Final    HEAVY GROWTH ESCHERICHIA COLI MODERATE GROWTH PROTEUS MIRABILIS LIGHT GROWTH KLEBSIELLA PNEUMONIAE MODERATE GROWTH ENTEROCOCCUS GALLINARUM CRITICAL RESULT CALLED TO, READ BACK BY  AND VERIFIED WITH: Cleda Clarks AT 1042 08/16/15 DV    Report Status 08/17/2015 FINAL  Final   Organism ID, Bacteria ESCHERICHIA COLI  Final   Organism ID, Bacteria PROTEUS MIRABILIS  Final   Organism ID, Bacteria KLEBSIELLA PNEUMONIAE  Final   Organism ID, Bacteria ENTEROCOCCUS GALLINARUM  Final      Susceptibility   Escherichia coli - MIC*    AMPICILLIN >=32 RESISTANT Resistant     CEFTAZIDIME 4 RESISTANT Resistant     CEFAZOLIN >=64 RESISTANT Resistant     CEFTRIAXONE 16 RESISTANT Resistant     GENTAMICIN 2 SENSITIVE Sensitive     IMIPENEM >=16 RESISTANT Resistant     TRIMETH/SULFA <=20 SENSITIVE Sensitive     Extended ESBL POSITIVE Resistant     PIP/TAZO Value in next row Resistant      RESISTANT>=128    CIPROFLOXACIN Value in next row Sensitive      SENSITIVE<=0.25    * HEAVY GROWTH ESCHERICHIA COLI   Klebsiella pneumoniae - MIC*    AMPICILLIN Value in next row Resistant      SENSITIVE<=0.25    CEFTAZIDIME Value in next row Resistant      SENSITIVE<=0.25    CEFAZOLIN Value in next row Resistant      SENSITIVE<=0.25    CEFTRIAXONE Value in next row Resistant      SENSITIVE<=0.25    CIPROFLOXACIN Value in next row Resistant      SENSITIVE<=0.25    GENTAMICIN Value in next row Sensitive      SENSITIVE<=0.25    IMIPENEM Value in next row Resistant      SENSITIVE<=0.25    TRIMETH/SULFA Value in next row Resistant      SENSITIVE<=0.25    PIP/TAZO Value in next row Resistant      RESISTANT>=128    * LIGHT GROWTH KLEBSIELLA PNEUMONIAE   Proteus mirabilis - MIC*     AMPICILLIN Value in next row Resistant      RESISTANT>=128    CEFTAZIDIME Value in next row Sensitive      RESISTANT>=128    CEFAZOLIN Value in next row Sensitive      RESISTANT>=128    CEFTRIAXONE Value in next row Sensitive      RESISTANT>=128    CIPROFLOXACIN Value in next row Sensitive      RESISTANT>=128    GENTAMICIN Value in next row Sensitive      RESISTANT>=128    IMIPENEM Value in next row Sensitive      RESISTANT>=128    TRIMETH/SULFA Value in next row Sensitive      RESISTANT>=128    PIP/TAZO Value in next row Sensitive      SENSITIVE<=4    * MODERATE GROWTH PROTEUS MIRABILIS   Enterococcus gallinarum - MIC*    AMPICILLIN Value in next row Resistant      SENSITIVE<=4    GENTAMICIN SYNERGY Value in next row Sensitive      SENSITIVE<=4    CIPROFLOXACIN Value in next row Resistant      RESISTANT>=8    TETRACYCLINE Value in next row Resistant      RESISTANT>=16    * MODERATE GROWTH ENTEROCOCCUS GALLINARUM  Wound culture     Status: None   Collection Time: 08/15/15  2:53 PM  Result Value Ref Range Status   Specimen Description WOUND  Final   Special Requests NONE  Final   Gram Stain FEW WBC SEEN NO ORGANISMS SEEN   Final   Culture NO GROWTH 3 DAYS  Final   Report Status 08/18/2015 FINAL  Final  C difficile quick scan w PCR reflex     Status: None   Collection Time: 08/17/15 11:34 AM  Result Value Ref Range Status   C Diff antigen NEGATIVE NEGATIVE Final   C Diff toxin NEGATIVE NEGATIVE Final   C Diff interpretation Negative for C. difficile  Final  Stool culture     Status: None   Collection Time: 09/03/15  3:51 PM  Result Value Ref Range Status   Specimen Description STOOL  Final   Special Requests Immunocompromised  Final   Culture   Final    NO SALMONELLA OR SHIGELLA ISOLATED No Pathogenic E. coli detected NO CAMPYLOBACTER DETECTED    Report Status 09/07/2015 FINAL  Final  C difficile quick scan w PCR reflex     Status: None   Collection Time:  09/13/15 12:51 AM  Result Value Ref Range Status   C Diff antigen NEGATIVE NEGATIVE Final   C Diff toxin NEGATIVE NEGATIVE Final   C Diff interpretation Negative for C. difficile  Final  Culture, blood (routine x 2)     Status: None   Collection Time: 09/16/15 11:24 AM  Result Value Ref Range Status   Specimen Description BLOOD LEFT HAND  Final   Special Requests BOTTLES DRAWN AEROBIC AND ANAEROBIC  1CC  Final   Culture NO GROWTH 5 DAYS  Final   Report Status 09/21/2015 FINAL  Final  Culture, blood (routine x 2)     Status: None   Collection Time: 09/16/15 12:23 PM  Result Value Ref Range Status   Specimen Description BLOOD RIGHT HAND  Final   Special Requests BOTTLES DRAWN AEROBIC AND ANAEROBIC  1CC  Final   Culture  Setup Time   Final    GRAM POSITIVE COCCI AEROBIC BOTTLE ONLY CRITICAL RESULT CALLED TO, READ BACK BY AND VERIFIED WITH: TESS THOMAS,RN 09/17/2015 0631 BY JRS.    Culture   Final    ENTEROCOCCUS FAECALIS AEROBIC BOTTLE ONLY VRE HAVE INTRINSIC RESISTANCE TO MOST COMMONLY USED ANTIBIOTICS AND THE ABILITY TO ACQUIRE RESISTANCE TO MOST AVAILABLE ANTIBIOTICS. CRITICAL RESULT CALLED TO, READ BACK BY AND VERIFIED WITH: CHERYL SMITH AT 1191 09/19/15 DV    Report Status 09/21/2015 FINAL  Final   Organism ID, Bacteria ENTEROCOCCUS FAECALIS  Final      Susceptibility   Enterococcus faecalis - MIC*    AMPICILLIN <=2 SENSITIVE Sensitive     LINEZOLID 2 SENSITIVE Sensitive     CIPROFLOXACIN Value in next row Resistant      RESISTANT>=8    TETRACYCLINE Value in next row Resistant      RESISTANT>=16    VANCOMYCIN Value in next row Resistant      RESISTANT>=32    GENTAMICIN SYNERGY Value in next row Resistant      RESISTANT>=32    * ENTEROCOCCUS FAECALIS  Culture, respiratory (NON-Expectorated)     Status: None   Collection Time: 09/16/15  3:50 PM  Result Value Ref Range Status   Specimen Description TRACHEAL ASPIRATE  Final   Special Requests Immunocompromised  Final    Gram Stain   Final    FEW WBC SEEN GOOD SPECIMEN - 80-90% WBCS RARE GRAM NEGATIVE RODS    Culture LIGHT GROWTH PSEUDOMONAS AERUGINOSA  Final   Report Status 09/19/2015 FINAL  Final   Organism ID, Bacteria PSEUDOMONAS AERUGINOSA  Final      Susceptibility   Pseudomonas aeruginosa - MIC*    CEFTAZIDIME 8  SENSITIVE Sensitive     CIPROFLOXACIN 2 INTERMEDIATE Intermediate     GENTAMICIN >=16 RESISTANT Resistant     IMIPENEM >=16 RESISTANT Resistant     PIP/TAZO Value in next row Sensitive      SENSITIVE32    CEFEPIME Value in next row Sensitive      SENSITIVE8    LEVOFLOXACIN Value in next row Resistant      RESISTANT>=8    * LIGHT GROWTH PSEUDOMONAS AERUGINOSA  Culture, expectorated sputum-assessment     Status: None   Collection Time: 09/22/15  2:09 PM  Result Value Ref Range Status   Specimen Description ENDOTRACHEAL  Final   Special Requests Normal  Final   Sputum evaluation THIS SPECIMEN IS ACCEPTABLE FOR SPUTUM CULTURE  Final   Report Status 09/24/2015 FINAL  Final  Culture, respiratory (NON-Expectorated)     Status: None   Collection Time: 09/22/15  2:09 PM  Result Value Ref Range Status   Specimen Description ENDOTRACHEAL  Final   Special Requests Normal Reflexed from [id number]  Final   Gram Stain   Final    FEW WBC SEEN FEW GRAM NEGATIVE RODS POOR SPECIMEN - LESS THAN 70% WBCS    Culture   Final    MODERATE GROWTH PSEUDOMONAS AERUGINOSA LIGHT GROWTH KLEBSIELLA PNEUMONIAE REFER TO SENSITIVITIES FROM PREVIOUS CULTURE FOR ORG 2    Report Status 10/01/2015 FINAL  Final   Organism ID, Bacteria PSEUDOMONAS AERUGINOSA  Final   Organism ID, Bacteria KLEBSIELLA PNEUMONIAE  Final      Susceptibility   Pseudomonas aeruginosa - MIC*    CEFTAZIDIME 4 SENSITIVE Sensitive     CIPROFLOXACIN 2 INTERMEDIATE Intermediate     GENTAMICIN >=16 RESISTANT Resistant     IMIPENEM >=16 RESISTANT Resistant     PIP/TAZO Value in next row Sensitive      SENSITIVE16    * MODERATE GROWTH  PSEUDOMONAS AERUGINOSA  Tissue culture     Status: None   Collection Time: 09/24/15  6:44 AM  Result Value Ref Range Status   Specimen Description BONE  Final   Special Requests Normal  Final   Gram Stain MODERATE WBC SEEN FEW GRAM NEGATIVE RODS   Final   Culture   Final    MODERATE GROWTH ESCHERICHIA COLI MODERATE GROWTH KLEBSIELLA PNEUMONIAE ESBL-EXTENDED SPECTRUM BETA LACTAMASE-THE ORGANISM IS RESISTANT TO PENICILLINS, CEPHALOSPORINS AND AZTREONAM ACCORDING TO CLSI [id number] VOL.25 N01 JAN 2005. ORGANISM 1 This organism isolate is resistant to one or more antiotic agents in three or more antimicrobial categories.  Suggest Infectious Disease consult.   ORGANISM 2 CRITICAL RESULT CALLED TO, READ BACK BY AND VERIFIED WITH: RN Mardene Celeste LINDSAY 09/27/15 1005AM    Report Status 09/28/2015 FINAL  Final   Organism ID, Bacteria ESCHERICHIA COLI  Final   Organism ID, Bacteria KLEBSIELLA PNEUMONIAE  Final      Susceptibility   Escherichia coli - MIC*    AMPICILLIN >=32 RESISTANT Resistant     CEFTAZIDIME 4 RESISTANT Resistant     CEFAZOLIN >=64 RESISTANT Resistant     CEFTRIAXONE 8 RESISTANT Resistant     GENTAMICIN <=1 SENSITIVE Sensitive     IMIPENEM 8 RESISTANT Resistant     TRIMETH/SULFA <=20 SENSITIVE Sensitive     Extended ESBL POSITIVE Resistant     PIP/TAZO Value in next row Resistant      RESISTANT>=128    * MODERATE GROWTH ESCHERICHIA COLI   Klebsiella pneumoniae - MIC*    AMPICILLIN Value in next row Resistant  RESISTANT>=128    CEFTAZIDIME Value in next row Resistant      RESISTANT>=128    CEFAZOLIN Value in next row Resistant      RESISTANT>=128    CEFTRIAXONE Value in next row Resistant      RESISTANT>=128    CIPROFLOXACIN Value in next row Resistant      RESISTANT>=128    GENTAMICIN Value in next row Resistant      RESISTANT>=128    IMIPENEM Value in next row Resistant      RESISTANT>=128    TRIMETH/SULFA Value in next row Sensitive      RESISTANT>=128     PIP/TAZO Value in next row Resistant      RESISTANT>=128    * MODERATE GROWTH KLEBSIELLA PNEUMONIAE  Anaerobic culture     Status: None   Collection Time: 09/24/15  3:55 PM  Result Value Ref Range Status   Specimen Description BONE  Final   Special Requests Normal  Final   Culture NO ANAEROBES ISOLATED  Final   Report Status 09/29/2015 FINAL  Final  C difficile quick scan w PCR reflex     Status: None   Collection Time: 10/07/15  2:02 PM  Result Value Ref Range Status   C Diff antigen NEGATIVE NEGATIVE Final   C Diff toxin NEGATIVE NEGATIVE Final   C Diff interpretation Negative for C. difficile  Final    Coagulation Studies: No results for input(s): LABPROT, INR in the last 72 hours.  Urinalysis: No results for input(s): COLORURINE, LABSPEC, PHURINE, GLUCOSEU, HGBUR, BILIRUBINUR, KETONESUR, PROTEINUR, UROBILINOGEN, NITRITE, LEUKOCYTESUR in the last 72 hours.  Invalid input(s): APPERANCEUR    Imaging: No results found.   Medications:   . feeding supplement (VITAL AF 1.2 CAL) 1,000 mL (10/12/15 1327)  . norepinephrine Stopped (10/08/15 0530)   . [START ON 10/17/2015] amantadine  200 mg Per Tube Q7 days  . antiseptic oral rinse  7 mL Mouth Rinse QID  . aspirin  81 mg Oral Daily  . calcium acetate (Phos Binder)  667 mg Oral TID WC  . chlorhexidine gluconate  15 mL Mouth Rinse BID  . epoetin (EPOGEN/PROCRIT) injection  10,000 Units Intravenous Q M,W,F-HD  . escitalopram  10 mg Oral Daily  . feeding supplement (PRO-STAT SUGAR FREE 64)  30 mL Oral 6 X Daily  . folic acid  1 mg Oral Daily  . free water  20 mL Per Tube 6 times per day  . heparin subcutaneous  5,000 Units Subcutaneous Q12H  . hydrocortisone  10 mg Per Tube BID  . insulin aspart  0-9 Units Subcutaneous 6 times per day  . ipratropium-albuterol  3 mL Nebulization QID  . lidocaine  1 patch Transdermal Q24H  . loperamide  2 mg Oral Daily  . midodrine  5 mg Per Tube TID WC  . multivitamin  5 mL Oral Daily  .  pantoprazole sodium  40 mg Per Tube Daily  . sodium chloride  10-40 mL Intracatheter Q12H  . sulfamethoxazole-trimethoprim  2.5 tablet Per Tube q1800  . thiamine  100 mg Per Tube Daily   sodium chloride, sodium chloride, acetaminophen (TYLENOL) oral liquid 160 mg/5 mL, HYDROmorphone (DILAUDID) injection, morphine injection, ondansetron (ZOFRAN) IV, oxyCODONE, sodium chloride, zinc oxide  Assessment/ Plan:  49 y.o. 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 Admitted on 08/04/2015  1. End-stage renal disease on hemodialysis on HD MWF. The patient has been on dialysis since October of 2014. R IJ permcath. Dialysis treatment with IV albumin for oncotic support.  - Patient had hemodialysis yesterday. No acute indication for dialysis today. We will plan for dialysis again tomorrow using albumin and low blood flow rate for blood pressure support.  2. Anemia of CKD: Hemoglobin 8.0 at last check. Continue Epogen with dialysis.  3. Secondary hyperparathyroidism: PTH low at 55 (9/25) -Continue PhosLo hyperphosphatemia.   4. Sepsis: VRE in blood. Bone cultures E. Coli and klebsiella - Treatment as per ID and ICU team: daptomycin completed on 11/10 and now on trimethoprim/sulfamethoxazole.   5. Generalized Dependent edema/Anasarca due to 3rd spacing -  Hypotension limits how much fluid we can remove. Continue ultrafiltration with  dialysis using albumin.  6. Acute resp failure - Currently on the ventilator. Overall prognosis remains very guarded. .  LOS: 62 Lucio Litsey 11/24/20168:04 AM

## 2015-10-14 LAB — CBC WITH DIFFERENTIAL/PLATELET
BASOS ABS: 0 10*3/uL (ref 0–0.1)
BASOS PCT: 0 %
Eosinophils Absolute: 0.1 10*3/uL (ref 0–0.7)
Eosinophils Relative: 1 %
HEMATOCRIT: 25 % — AB (ref 35.0–47.0)
HEMOGLOBIN: 7.5 g/dL — AB (ref 12.0–16.0)
LYMPHS PCT: 4 %
Lymphs Abs: 0.5 10*3/uL — ABNORMAL LOW (ref 1.0–3.6)
MCH: 30.6 pg (ref 26.0–34.0)
MCHC: 30.2 g/dL — ABNORMAL LOW (ref 32.0–36.0)
MCV: 101.6 fL — AB (ref 80.0–100.0)
Monocytes Absolute: 0.8 10*3/uL (ref 0.2–0.9)
Monocytes Relative: 7 %
NEUTROS ABS: 10.9 10*3/uL — AB (ref 1.4–6.5)
NEUTROS PCT: 88 %
Platelets: 183 10*3/uL (ref 150–440)
RBC: 2.46 MIL/uL — AB (ref 3.80–5.20)
RDW: 18 % — ABNORMAL HIGH (ref 11.5–14.5)
WBC: 12.3 10*3/uL — ABNORMAL HIGH (ref 3.6–11.0)

## 2015-10-14 LAB — BASIC METABOLIC PANEL
ANION GAP: 8 (ref 5–15)
BUN: 74 mg/dL — ABNORMAL HIGH (ref 6–20)
CALCIUM: 8.5 mg/dL — AB (ref 8.9–10.3)
CO2: 32 mmol/L (ref 22–32)
Chloride: 105 mmol/L (ref 101–111)
Creatinine, Ser: 1.2 mg/dL — ABNORMAL HIGH (ref 0.44–1.00)
GFR, EST NON AFRICAN AMERICAN: 52 mL/min — AB (ref >60)
Glucose, Bld: 109 mg/dL — ABNORMAL HIGH (ref 65–99)
POTASSIUM: 4.2 mmol/L (ref 3.5–5.1)
Sodium: 145 mmol/L (ref 135–145)

## 2015-10-14 LAB — GLUCOSE, CAPILLARY
GLUCOSE-CAPILLARY: 129 mg/dL — AB (ref 65–99)
Glucose-Capillary: 106 mg/dL — ABNORMAL HIGH (ref 65–99)
Glucose-Capillary: 138 mg/dL — ABNORMAL HIGH (ref 65–99)
Glucose-Capillary: 165 mg/dL — ABNORMAL HIGH (ref 65–99)
Glucose-Capillary: 185 mg/dL — ABNORMAL HIGH (ref 65–99)
Glucose-Capillary: 93 mg/dL (ref 65–99)

## 2015-10-14 MED ORDER — ALBUMIN HUMAN 25 % IV SOLN
25.0000 g | Freq: Once | INTRAVENOUS | Status: AC
  Administered 2015-10-14: 25 g via INTRAVENOUS
  Filled 2015-10-14: qty 100

## 2015-10-14 NOTE — Progress Notes (Signed)
MEDICATION RELATED CONSULT NOTE - Follow up   Pharmacy Consult for Renal dosing  Allergies  Allergen Reactions  . Contrast Media [Iodinated Diagnostic Agents] Anaphylaxis  . Ampicillin Rash    Patient Measurements: Height: 5\' 9"  (175.3 cm) Weight:  (not able to weigh due to bed ) IBW/kg (Calculated) : 66.2  Vital Signs: Temp: 100 F (37.8 C) (11/25 1200) Temp Source: Axillary (11/25 1200) BP: 109/54 mmHg (11/25 1300) Pulse Rate: 117 (11/25 1300) Intake/Output from previous day: 11/24 0701 - 11/25 0700 In: 1470 [NG/GT:1470] Out: 2 [Stool:2] Intake/Output from this shift:    Labs:  Recent Labs  10/12/15 0636 10/14/15 0514  WBC 8.7 12.3*  HGB 8.0* 7.5*  HCT 25.9* 25.0*  PLT 190 183  CREATININE 1.42* 1.20*  PHOS 5.1*  --   ALBUMIN 2.2*  --    Estimated Creatinine Clearance: 69.5 mL/min (by C-G formula based on Cr of 1.2).   Microbiology: Recent Results (from the past 720 hour(s))  Culture, blood (routine x 2)     Status: None   Collection Time: 09/16/15 11:24 AM  Result Value Ref Range Status   Specimen Description BLOOD LEFT HAND  Final   Special Requests BOTTLES DRAWN AEROBIC AND ANAEROBIC  1CC  Final   Culture NO GROWTH 5 DAYS  Final   Report Status 09/21/2015 FINAL  Final  Culture, blood (routine x 2)     Status: None   Collection Time: 09/16/15 12:23 PM  Result Value Ref Range Status   Specimen Description BLOOD RIGHT HAND  Final   Special Requests BOTTLES DRAWN AEROBIC AND ANAEROBIC  1CC  Final   Culture  Setup Time   Final    GRAM POSITIVE COCCI AEROBIC BOTTLE ONLY CRITICAL RESULT CALLED TO, READ BACK BY AND VERIFIED WITH: TESS THOMAS,RN 09/17/2015 0631 BY JRS.    Culture   Final    ENTEROCOCCUS FAECALIS AEROBIC BOTTLE ONLY VRE HAVE INTRINSIC RESISTANCE TO MOST COMMONLY USED ANTIBIOTICS AND THE ABILITY TO ACQUIRE RESISTANCE TO MOST AVAILABLE ANTIBIOTICS. CRITICAL RESULT CALLED TO, READ BACK BY AND VERIFIED WITH: CHERYL SMITH AT [id number] 09/19/15 DV    Report Status 09/21/2015 FINAL  Final   Organism ID, Bacteria ENTEROCOCCUS FAECALIS  Final      Susceptibility   Enterococcus faecalis - MIC*    AMPICILLIN <=2 SENSITIVE Sensitive     LINEZOLID 2 SENSITIVE Sensitive     CIPROFLOXACIN Value in next row Resistant      RESISTANT>=8    TETRACYCLINE Value in next row Resistant      RESISTANT>=16    VANCOMYCIN Value in next row Resistant      RESISTANT>=32    GENTAMICIN SYNERGY Value in next row Resistant      RESISTANT>=32    * ENTEROCOCCUS FAECALIS  Culture, respiratory (NON-Expectorated)     Status: None   Collection Time: 09/16/15  3:50 PM  Result Value Ref Range Status   Specimen Description TRACHEAL ASPIRATE  Final   Special Requests Immunocompromised  Final   Gram Stain   Final    FEW WBC SEEN GOOD SPECIMEN - 80-90% WBCS RARE GRAM NEGATIVE RODS    Culture LIGHT GROWTH PSEUDOMONAS AERUGINOSA  Final   Report Status 09/19/2015 FINAL  Final   Organism ID, Bacteria PSEUDOMONAS AERUGINOSA  Final      Susceptibility   Pseudomonas aeruginosa - MIC*    CEFTAZIDIME 8 SENSITIVE Sensitive     CIPROFLOXACIN 2 INTERMEDIATE Intermediate     GENTAMICIN >=16 RESISTANT Resistant  IMIPENEM >=16 RESISTANT Resistant     PIP/TAZO Value in next row Sensitive      SENSITIVE32    CEFEPIME Value in next row Sensitive      SENSITIVE8    LEVOFLOXACIN Value in next row Resistant      RESISTANT>=8    * LIGHT GROWTH PSEUDOMONAS AERUGINOSA  Culture, expectorated sputum-assessment     Status: None   Collection Time: 09/22/15  2:09 PM  Result Value Ref Range Status   Specimen Description ENDOTRACHEAL  Final   Special Requests Normal  Final   Sputum evaluation THIS SPECIMEN IS ACCEPTABLE FOR SPUTUM CULTURE  Final   Report Status 09/24/2015 FINAL  Final  Culture, respiratory (NON-Expectorated)     Status: None   Collection Time: 09/22/15  2:09 PM  Result Value Ref Range Status   Specimen Description ENDOTRACHEAL  Final   Special Requests  Normal Reflexed from [id number]  Final   Gram Stain   Final    FEW WBC SEEN FEW GRAM NEGATIVE RODS POOR SPECIMEN - LESS THAN 70% WBCS    Culture   Final    MODERATE GROWTH PSEUDOMONAS AERUGINOSA LIGHT GROWTH KLEBSIELLA PNEUMONIAE REFER TO SENSITIVITIES FROM PREVIOUS CULTURE FOR ORG 2    Report Status 10/01/2015 FINAL  Final   Organism ID, Bacteria PSEUDOMONAS AERUGINOSA  Final   Organism ID, Bacteria KLEBSIELLA PNEUMONIAE  Final      Susceptibility   Pseudomonas aeruginosa - MIC*    CEFTAZIDIME 4 SENSITIVE Sensitive     CIPROFLOXACIN 2 INTERMEDIATE Intermediate     GENTAMICIN >=16 RESISTANT Resistant     IMIPENEM >=16 RESISTANT Resistant     PIP/TAZO Value in next row Sensitive      SENSITIVE16    * MODERATE GROWTH PSEUDOMONAS AERUGINOSA  Tissue culture     Status: None   Collection Time: 09/24/15  6:44 AM  Result Value Ref Range Status   Specimen Description BONE  Final   Special Requests Normal  Final   Gram Stain MODERATE WBC SEEN FEW GRAM NEGATIVE RODS   Final   Culture   Final    MODERATE GROWTH ESCHERICHIA COLI MODERATE GROWTH KLEBSIELLA PNEUMONIAE ESBL-EXTENDED SPECTRUM BETA LACTAMASE-THE ORGANISM IS RESISTANT TO PENICILLINS, CEPHALOSPORINS AND AZTREONAM ACCORDING TO CLSI M100-S15 VOL.25 N01 JAN 2005. ORGANISM 1 This organism isolate is resistant to one or more antiotic agents in three or more antimicrobial categories.  Suggest Infectious Disease consult.   ORGANISM 2 CRITICAL RESULT CALLED TO, READ BACK BY AND VERIFIED WITH: RN Mardene Celeste LINDSAY 09/27/15 1005AM    Report Status 09/28/2015 FINAL  Final   Organism ID, Bacteria ESCHERICHIA COLI  Final   Organism ID, Bacteria KLEBSIELLA PNEUMONIAE  Final      Susceptibility   Escherichia coli - MIC*    AMPICILLIN >=32 RESISTANT Resistant     CEFTAZIDIME 4 RESISTANT Resistant     CEFAZOLIN >=64 RESISTANT Resistant     CEFTRIAXONE 8 RESISTANT Resistant     GENTAMICIN <=1 SENSITIVE Sensitive     IMIPENEM 8 RESISTANT Resistant      TRIMETH/SULFA <=20 SENSITIVE Sensitive     Extended ESBL POSITIVE Resistant     PIP/TAZO Value in next row Resistant      RESISTANT>=128    * MODERATE GROWTH ESCHERICHIA COLI   Klebsiella pneumoniae - MIC*    AMPICILLIN Value in next row Resistant      RESISTANT>=128    CEFTAZIDIME Value in next row Resistant      RESISTANT>=128  CEFAZOLIN Value in next row Resistant      RESISTANT>=128    CEFTRIAXONE Value in next row Resistant      RESISTANT>=128    CIPROFLOXACIN Value in next row Resistant      RESISTANT>=128    GENTAMICIN Value in next row Resistant      RESISTANT>=128    IMIPENEM Value in next row Resistant      RESISTANT>=128    TRIMETH/SULFA Value in next row Sensitive      RESISTANT>=128    PIP/TAZO Value in next row Resistant      RESISTANT>=128    * MODERATE GROWTH KLEBSIELLA PNEUMONIAE  Anaerobic culture     Status: None   Collection Time: 09/24/15  3:55 PM  Result Value Ref Range Status   Specimen Description BONE  Final   Special Requests Normal  Final   Culture NO ANAEROBES ISOLATED  Final   Report Status 09/29/2015 FINAL  Final  C difficile quick scan w PCR reflex     Status: None   Collection Time: 10/07/15  2:02 PM  Result Value Ref Range Status   C Diff antigen NEGATIVE NEGATIVE Final   C Diff toxin NEGATIVE NEGATIVE Final   C Diff interpretation Negative for C. difficile  Final    Medical History: Past Medical History  Diagnosis Date  . Obesity   . Dyslipidemia   . Hypertension   . Coronary artery disease     s/p BMS 2010 LAD  . Dysrhythmia     ventricular tachycardia resolved after LAD stent and beta blocker  . Diabetes mellitus     with retinopathy, neuropathy and microalbuminemia  . ESRD (end stage renal disease) on dialysis     Assessment: 49 yo patient on HD. Pharmacy consulted for renal dosing of medications.   Plan:  No medications require adjustment at present. Pharmacy will continue to monitor patients medications and dose  adjust as needed.    Cher Nakai, PharmD Pharmacy Resident 10/14/2015 3:11 PM

## 2015-10-14 NOTE — Progress Notes (Signed)
Multiple family members at bedside.  PT appears comfortable.  Denies pain.

## 2015-10-14 NOTE — Progress Notes (Signed)
[facility] Medicine Progess Note  Name: Courtney GrinderHedda McMillian Monroe MRN: [id number] DOB: November 21, 1965    ADMISSION DATE:  07/28/2015     INITIAL PRESENTATION:  49 AAF who has been in medical facilities (hosp, LTAC, rehab) for 2 yrs following gastric bypass surgery with multiple complications. Now with chronic trach, ESRD, profound debilitation, severe sacral pressure ulcer. Was seemingly making progress and transferred to rehab facility approx one week prior to this admission. [facility] with [facility] and hypotension. Working dx of severe sepsis/septic shock due to infected sacral pressure ulcer. Since admission. Her course is been very complicated with numerous complications including septic shock and GI bleeding.    INTERVAL HISTORY:  The patient is off the ventilator this morning, she is back on trach collar. She is awake and alert and has no particular complaints. The patient's husband is in the room. He was updated on the patient's care. Our notes, the patient continued to have diarrhea overnight, patient has concerns about the patient's diarrhea and his tube feeds. When necessary asked me about potentially placing a rectal tube.    Subjective: 11/24 >> awake and following commands, tolerating PRVC, will attempt PSV mode and cont with current medical plan. AM labs.   11/25>>awake following command, plan for PSV mode, HD today. Husband updated.   MAJOR EVENTS/TEST RESULTS: Admission 02/07/14-05/07/14 Admission 07/21/14-09/06/14 Discharged to [facility]. Pt had palliative consult at that time, were asked to sign off by husband.  09/23 CT head: NAD 09/23 EEG: no epileptiform activity 09/23 PRBCs for Hgb 6.4 09/24 bedside debridement of sacral wound. Abscess drained 09/25 Off vasopressors. More alert. No distress. Worsening thrombocytopenia. Vanc DC'd 09/29  Dr. [name] (I.D) excused from the case by patient's husband. 10/02 MRI -multiple infarcts 10/03 tracheal  bleeding- transfused platelets 10/03 hospitalist service excused from the case by patient's husband 10/03 Echocardiogram ejection fraction was 55-60%, pulmonary systolic pressure was 39 mmHg 52/8410/08 restart TF's at lower rate, attempt reg HD  10/12 Transferred to med-surg floor. Remains on PCCM service 10/14 SLP eval: pt unable to tolerate PMV adequately 10/17-will re-attempt PM valve-discussed with Speech therapist 10/18 passed swallow eval-start pureed thick foods no thin liquids-continue NG feeds 10/19 transferred to step down for sepsis/aspiration pneumonia 10/19 cxr shows RLL opacity 10/21 sacral decub debride at bedside by surgery 10/22 started back on vasopressors while on HD 10/27 CT with osteo, R hip fx, unable to identify tip of dubhoff tube - sent for fluoro study 10/28 Ortho consultation: I do not feel that she is a surgical candidate. Therefore, I feel that it would best to manage this fracture nonsurgically and allow it to heal by itself over time, which it should.  10/28 Gen Surg consultation: Due to the lack of free air or free flow of contrast and the peritoneum there is no indication for any surgical intervention on this. Would recommend pulling back the feeding tube 1-2 cm. Would recheck an abdominal film to confirm no pneumoperitoneum in the morning. Absence of any changes okay to continue using Dobbhoff for feeding and medications. 10/28 gastrograffin study: The study confirms that the feeding tube pes perforated through the duodenum and the tip is within a cavity that fills with injected contrast. The cavity does appear walled off 11/4 refuses oral feeds, continue TF's CT reviewed: T8-T9 discitis/osteomyelitis, RT HIP FRACTURE 11/5 placed back on vasopressors, placed back on Vent due to resp acidosis, levophed turned off 11/6 afternoon 11/5 PM Trach changed out due to cuff  leak, #6 Shiley cuffed, 11/6 dubhoff occluded, removed and replaced, new dubhoff shows tube in the antral  stomach 11/15 refusing to take oral feeds 11/18 placed back on Vent for increased WOB,SOB  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 Pneumoniae, P. Mirabilis, VRE Wound 9/26 >> No growth Blood 9/23 >> NEG CDiff 9/27>>neg Stool Cx 10/15>> negative Cdiff 10/25>>neg Trach Aspirate 10/28>> light growth pseudomonas Blood 10/28 >> 1/2 GPC >>  Sputum cultures obtained 09/22/15 due to mucus plugging>>Pseudomonas BONE TISSUE Cx 11/3>>E.coli (ESBL), k. Pneumonia (daptomycin and septra)  ANTIMICROBIALS:  Aztreonam 9/23 >> 9/24 Vanc 9/23 >> 9/25 Vanc 9/26>>9/27 Daptomycin 9/27>> 10/12, 10/28>>  Meropenem 9/23 >> 10/14, 10/28 >> 10/31 Septra 11/8>>   VITAL SIGNS: Temp:  [98.5 F (36.9 C)-101 F (38.3 C)] 99.5 F (37.5 C) (11/25 0330) Pulse Rate:  [112-123] 118 (11/25 0900) Resp:  [16-27] 24 (11/25 0900) BP: (104-141)/(45-115) 123/83 mmHg (11/25 0900) SpO2:  [91 %-100 %] 97 % (11/25 0900) FiO2 (%):  [30 %] 30 % (11/25 0825) HEMODYNAMICS:   VENTILATOR SETTINGS: Vent Mode:  [-] PRVC FiO2 (%):  [30 %] 30 % Set Rate:  [15 bmp] 15 bmp Vt Set:  [500 mL] 500 mL PEEP:  [5 cmH20] 5 cmH20 INTAKE / OUTPUT:  Intake/Output Summary (Last 24 hours) at 10/14/15 1050 Last data filed at 10/14/15 0434  Gross per 24 hour  Intake   1040 ml  Output      2 ml  Net   1038 ml    PHYSICAL EXAMINATION: Physical Examination:   VS: BP 123/83 mmHg  Pulse 118  Temp(Src) 99.5 F (37.5 C) (Axillary)  Resp 24  Ht  (1.753 m)  Wt 213 lb 13.5 oz (97 kg)  BMI 31.57 kg/m2  SpO2 97%  LMP  (LMP Unknown)  General Appearance: No resp distress,  coarse upper airway sounds, off vent, on TCT Neuro: profoundly diffusely weak, alert today, nodding head HEENT: cushingoid facies, PERRLA, EOM intact Neck: trach site clean Pulmonary: clear anteriorly Cardiovascular: reg, + syst murmur Abdomen: soft, NT, +BS Extremities: warm, R>L UE edema Examination of the sacral decubitus ulcer- showed a very deep ulcer approximately 7-8 cm across.   stage IV ulcer.   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 Diff Stool incontinence - husband has refused to allow rectal tube placement Acute on chronic anemia Thrombocytopenia, resolved - HIT panel negative 9/25 Recurrent Severe sepsis, due to sacral pressure ulcer ID following  DM 2, controlled.  Chronic steroid therapy, for a history of of adrenal insufficiency Incidental finding of R hip fx - conservative mgmt. Waxing and waning encephalopathy - she  was evaluated by both the psychiatry and neurology services, and it was deemed that the patient lacks decisional capacity at this time. 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 Husband declined TEE Profound deconditioning-criticall illness neuropathy Chronic pain  Cont vent support - settings reviewed and/or adjusted Wean in PSV <> ATC as tolerated Cont vent bundle Daily SBT if/when meets criteria Monitor BMET intermittently Monitor I/Os Correct electrolytes as indicated Intermittent HD per Renal service Cont TFs as tolerated Cont SUP Cont DVT px Cont TMP-SMX per ID service DVT px: SQ  heparin Monitor CBC intermittently Transfuse per usual ICU guidelines Cont analgesics PRN Cont to work on [employer] placement   Husband updated via telephone   I have personally obtained a history, examined the patient, evaluated laboratory and imaging results, formulated the assessment and plan and placed orders. CRITICAL CARE: The patient is critically ill with multiple organ systems failure and requires high complexity decision making for assessment and support, frequent evaluation and titration of therapies, application of advanced monitoring technologies and extensive interpretation of multiple databases.   Critical Care Time devoted to patient care services described in this note is 35 minutes.    Stephanie Acre, MD [facility] Pager [phone] (please enter 7-digits) On Call Pager - [phone] (please enter 7-digits)

## 2015-10-14 NOTE — Progress Notes (Signed)
Pt care assumed, report received.  Pt resting on Vent, awake and appears alert.  Nodding head in what seems to be appropriate responses.  No family at bedside at this time.  See flowsheet for further details.

## 2015-10-14 NOTE — Progress Notes (Signed)
Subjective:  No significant change at present. Patient due for hemodialysis today. Remains in the intensive care unit.   Objective:  Vital signs in last 24 hours:  Temp:  [98.5 F (36.9 C)-101 F (38.3 C)] 99.5 F (37.5 C) (11/25 0330) Pulse Rate:  [112-123] 116 (11/25 0700) Resp:  [16-31] 23 (11/25 0700) BP: (101-141)/(45-115) 129/65 mmHg (11/25 0700) SpO2:  [91 %-100 %] 97 % (11/25 0700) FiO2 (%):  [30 %] 30 % (11/25 0434)  Weight change:  Filed Weights    Intake/Output: I/O last 3 completed shifts: In: 2280 [NG/GT:2280] Out: 2 [Stool:2]   Intake/Output this shift:     Physical Exam: General: No acute distress, chronically ill appearing  Head: NG in place, OM moist  Eyes: anicteric  Neck: Tracheostomy in place, vent assisted  Lungs:  Scattered rhonchi, normal effort  Heart: S1S2 +tachycardia  Abdomen:  Soft, nontender  Extremities: +++ dependent edema, anasarca  Neurologic: Lethargic but arousable  Access:  R IJ permcath.       Basic Metabolic Panel:  Recent Labs Lab 10/07/15 1600 10/09/15 0506 10/10/15 0613 10/12/15 0636 10/14/15 0514  NA 141 139 141 142 145  K 4.3 4.7 5.2* 4.7 4.2  CL 100* 100* 99* 101 105  CO2 34* 32 31 32 32  GLUCOSE 146* 123* 81 136* 109*  BUN 64* 91* 100* 84* 74*  CREATININE 0.98 1.35* 1.63* 1.42* 1.20*  CALCIUM 8.4* 8.4* 8.5* 8.5* 8.5*  PHOS 3.4  --   --  5.1*  --     Liver Function Tests:  Recent Labs Lab 10/07/15 1600 10/12/15 0636  ALBUMIN 2.6* 2.2*   No results for input(s): LIPASE, AMYLASE in the last 168 hours. No results for input(s): AMMONIA in the last 168 hours.  CBC:  Recent Labs Lab 10/07/15 1600 10/09/15 0506 10/10/15 0613 10/12/15 0636 10/14/15 0514  WBC 11.7* 9.1 7.7 8.7 12.3*  NEUTROABS  --   --   --   --  10.9*  HGB 8.8* 8.0* 8.4* 8.0* 7.5*  HCT 28.5* 25.7* 26.6* 25.9* 25.0*  MCV 103.4* 102.7* 101.3* 102.5* 101.6*  PLT 201 191 195 190 183    Cardiac Enzymes: No results for  input(s): CKTOTAL, CKMB, CKMBINDEX, TROPONINI in the last 168 hours.  BNP: Invalid input(s): POCBNP  CBG:  Recent Labs Lab 10/13/15 1559 10/13/15 2006 10/13/15 2358 10/14/15 0351 10/14/15 0747  GLUCAP 135* 154* 106* 93 129*    Microbiology: Results for orders placed or performed during the hospital encounter of 08/15/2015  Blood Culture (routine x 2)     Status: None   Collection Time: 08/11/2015  8:51 AM  Result Value Ref Range Status   Specimen Description BLOOD Dolores HooseUNKO  Final   Special Requests BOTTLES DRAWN AEROBIC AND ANAEROBIC  3CC  Final   Culture NO GROWTH 5 DAYS  Final   Report Status 08/17/2015 FINAL  Final  Blood Culture (routine x 2)     Status: None   Collection Time: 08/13/2015  9:20 AM  Result Value Ref Range Status   Specimen Description BLOOD LEFT ARM  Final   Special Requests BOTTLES DRAWN AEROBIC AND ANAEROBIC  1CC  Final   Culture NO GROWTH 5 DAYS  Final   Report Status 08/17/2015 FINAL  Final  Wound culture     Status: None   Collection Time: 08/11/2015  9:20 AM  Result Value Ref Range Status   Specimen Description DECUBITIS  Final   Special Requests Normal  Final  Gram Stain   Final    FEW WBC SEEN MANY GRAM NEGATIVE RODS RARE GRAM POSITIVE COCCI    Culture   Final    HEAVY GROWTH ESCHERICHIA COLI MODERATE GROWTH ENTEROBACTER AEROGENES PROTEUS MIRABILIS HEAVY GROWTH ENTEROCOCCUS SPECIES VRE HAVE INTRINSIC RESISTANCE TO MOST COMMONLY USED ANTIBIOTICS AND THE ABILITY TO ACQUIRE RESISTANCE TO MOST AVAILABLE ANTIBIOTICS.    Report Status 08/16/2015 FINAL  Final   Organism ID, Bacteria ESCHERICHIA COLI  Final   Organism ID, Bacteria ENTEROBACTER AEROGENES  Final   Organism ID, Bacteria PROTEUS MIRABILIS  Final   Organism ID, Bacteria ENTEROCOCCUS SPECIES  Final      Susceptibility   Enterobacter aerogenes - MIC*    CEFTAZIDIME <=1 SENSITIVE Sensitive     CEFAZOLIN >=64 RESISTANT Resistant     CEFTRIAXONE <=1 SENSITIVE Sensitive     CIPROFLOXACIN  <=0.25 SENSITIVE Sensitive     GENTAMICIN <=1 SENSITIVE Sensitive     IMIPENEM 1 SENSITIVE Sensitive     TRIMETH/SULFA <=20 SENSITIVE Sensitive     * MODERATE GROWTH ENTEROBACTER AEROGENES   Escherichia coli - MIC*    AMPICILLIN <=2 SENSITIVE Sensitive     CEFTAZIDIME <=1 SENSITIVE Sensitive     CEFAZOLIN <=4 SENSITIVE Sensitive     CEFTRIAXONE <=1 SENSITIVE Sensitive     CIPROFLOXACIN <=0.25 SENSITIVE Sensitive     GENTAMICIN <=1 SENSITIVE Sensitive     IMIPENEM <=0.25 SENSITIVE Sensitive     TRIMETH/SULFA <=20 SENSITIVE Sensitive     * HEAVY GROWTH ESCHERICHIA COLI   Proteus mirabilis - MIC*    AMPICILLIN >=32 RESISTANT Resistant     CEFTAZIDIME <=1 SENSITIVE Sensitive     CEFAZOLIN 8 SENSITIVE Sensitive     CEFTRIAXONE <=1 SENSITIVE Sensitive     CIPROFLOXACIN <=0.25 SENSITIVE Sensitive     GENTAMICIN <=1 SENSITIVE Sensitive     IMIPENEM 1 SENSITIVE Sensitive     TRIMETH/SULFA <=20 SENSITIVE Sensitive     * PROTEUS MIRABILIS   Enterococcus species - MIC*    AMPICILLIN >=32 RESISTANT Resistant     VANCOMYCIN >=32 RESISTANT Resistant     GENTAMICIN SYNERGY SENSITIVE Sensitive     TETRACYCLINE Value in next row Resistant      RESISTANT>=16    * HEAVY GROWTH ENTEROCOCCUS SPECIES  MRSA PCR Screening     Status: None   Collection Time: 07/21/2015  2:38 PM  Result Value Ref Range Status   MRSA by PCR NEGATIVE NEGATIVE Final    Comment:        The GeneXpert MRSA Assay (FDA approved for NASAL specimens only), is one component of a comprehensive MRSA colonization surveillance program. It is not intended to diagnose MRSA infection nor to guide or monitor treatment for MRSA infections.   Blood culture (single)     Status: None   Collection Time: 07/21/2015  3:36 PM  Result Value Ref Range Status   Specimen Description BLOOD RIGHT ASSIST CONTROL  Final   Special Requests BOTTLES DRAWN AEROBIC AND ANAEROBIC  Final   Culture NO GROWTH 5 DAYS  Final   Report Status 08/17/2015  FINAL  Final  Wound culture     Status: None   Collection Time: 08/13/15 12:37 PM  Result Value Ref Range Status   Specimen Description WOUND  Final   Special Requests Normal  Final   Gram Stain   Final    FEW WBC SEEN TOO NUMEROUS TO COUNT GRAM NEGATIVE RODS FEW GRAM POSITIVE COCCI    Culture  Final    HEAVY GROWTH ESCHERICHIA COLI MODERATE GROWTH PROTEUS MIRABILIS LIGHT GROWTH KLEBSIELLA PNEUMONIAE MODERATE GROWTH ENTEROCOCCUS GALLINARUM CRITICAL RESULT CALLED TO, READ BACK BY AND VERIFIED WITH: [facility] BORBA AT 1042 08/16/15 DV    Report Status 08/17/2015 FINAL  Final   Organism ID, Bacteria ESCHERICHIA COLI  Final   Organism ID, Bacteria PROTEUS MIRABILIS  Final   Organism ID, Bacteria KLEBSIELLA PNEUMONIAE  Final   Organism ID, Bacteria ENTEROCOCCUS GALLINARUM  Final      Susceptibility   Escherichia coli - MIC*    AMPICILLIN >=32 RESISTANT Resistant     CEFTAZIDIME 4 RESISTANT Resistant     CEFAZOLIN >=64 RESISTANT Resistant     CEFTRIAXONE 16 RESISTANT Resistant     GENTAMICIN 2 SENSITIVE Sensitive     IMIPENEM >=16 RESISTANT Resistant     TRIMETH/SULFA <=20 SENSITIVE Sensitive     Extended ESBL POSITIVE Resistant     PIP/TAZO Value in next row Resistant      RESISTANT>=128    CIPROFLOXACIN Value in next row Sensitive      SENSITIVE<=0.25    * HEAVY GROWTH ESCHERICHIA COLI   Klebsiella pneumoniae - MIC*    AMPICILLIN Value in next row Resistant      SENSITIVE<=0.25    CEFTAZIDIME Value in next row Resistant      SENSITIVE<=0.25    CEFAZOLIN Value in next row Resistant      SENSITIVE<=0.25    CEFTRIAXONE Value in next row Resistant      SENSITIVE<=0.25    CIPROFLOXACIN Value in next row Resistant      SENSITIVE<=0.25    GENTAMICIN Value in next row Sensitive      SENSITIVE<=0.25    IMIPENEM Value in next row Resistant      SENSITIVE<=0.25    TRIMETH/SULFA Value in next row Resistant      SENSITIVE<=0.25    PIP/TAZO Value in next row Resistant       RESISTANT>=128    * LIGHT GROWTH KLEBSIELLA PNEUMONIAE   Proteus mirabilis - MIC*    AMPICILLIN Value in next row Resistant      RESISTANT>=128    CEFTAZIDIME Value in next row Sensitive      RESISTANT>=128    CEFAZOLIN Value in next row Sensitive      RESISTANT>=128    CEFTRIAXONE Value in next row Sensitive      RESISTANT>=128    CIPROFLOXACIN Value in next row Sensitive      RESISTANT>=128    GENTAMICIN Value in next row Sensitive      RESISTANT>=128    IMIPENEM Value in next row Sensitive      RESISTANT>=128    TRIMETH/SULFA Value in next row Sensitive      RESISTANT>=128    PIP/TAZO Value in next row Sensitive      SENSITIVE<=4    * MODERATE GROWTH PROTEUS MIRABILIS   Enterococcus gallinarum - MIC*    AMPICILLIN Value in next row Resistant      SENSITIVE<=4    GENTAMICIN SYNERGY Value in next row Sensitive      SENSITIVE<=4    CIPROFLOXACIN Value in next row Resistant      RESISTANT>=8    TETRACYCLINE Value in next row Resistant      RESISTANT>=16    * MODERATE GROWTH ENTEROCOCCUS GALLINARUM  Wound culture     Status: None   Collection Time: 08/15/15  2:53 PM  Result Value Ref Range Status   Specimen Description WOUND  Final  Special Requests NONE  Final   Gram Stain FEW WBC SEEN NO ORGANISMS SEEN   Final   Culture NO GROWTH 3 DAYS  Final   Report Status 08/18/2015 FINAL  Final  C difficile quick scan w PCR reflex     Status: None   Collection Time: 08/17/15 11:34 AM  Result Value Ref Range Status   C Diff antigen NEGATIVE NEGATIVE Final   C Diff toxin NEGATIVE NEGATIVE Final   C Diff interpretation Negative for C. difficile  Final  Stool culture     Status: None   Collection Time: 09/03/15  3:51 PM  Result Value Ref Range Status   Specimen Description STOOL  Final   Special Requests Immunocompromised  Final   Culture   Final    NO SALMONELLA OR SHIGELLA ISOLATED No Pathogenic E. coli detected NO CAMPYLOBACTER DETECTED    Report Status 09/07/2015 FINAL   Final  C difficile quick scan w PCR reflex     Status: None   Collection Time: 09/13/15 12:51 AM  Result Value Ref Range Status   C Diff antigen NEGATIVE NEGATIVE Final   C Diff toxin NEGATIVE NEGATIVE Final   C Diff interpretation Negative for C. difficile  Final  Culture, blood (routine x 2)     Status: None   Collection Time: 09/16/15 11:24 AM  Result Value Ref Range Status   Specimen Description BLOOD LEFT HAND  Final   Special Requests BOTTLES DRAWN AEROBIC AND ANAEROBIC  1CC  Final   Culture NO GROWTH 5 DAYS  Final   Report Status 09/21/2015 FINAL  Final  Culture, blood (routine x 2)     Status: None   Collection Time: 09/16/15 12:23 PM  Result Value Ref Range Status   Specimen Description BLOOD RIGHT HAND  Final   Special Requests BOTTLES DRAWN AEROBIC AND ANAEROBIC  1CC  Final   Culture  Setup Time   Final    GRAM POSITIVE COCCI AEROBIC BOTTLE ONLY CRITICAL RESULT CALLED TO, READ BACK BY AND VERIFIED WITH: TESS THOMAS,RN 09/17/2015 0631 BY JRS.    Culture   Final    ENTEROCOCCUS FAECALIS AEROBIC BOTTLE ONLY VRE HAVE INTRINSIC RESISTANCE TO MOST COMMONLY USED ANTIBIOTICS AND THE ABILITY TO ACQUIRE RESISTANCE TO MOST AVAILABLE ANTIBIOTICS. CRITICAL RESULT CALLED TO, READ BACK BY AND VERIFIED WITH: CHERYL SMITH AT 8119 09/19/15 DV    Report Status 09/21/2015 FINAL  Final   Organism ID, Bacteria ENTEROCOCCUS FAECALIS  Final      Susceptibility   Enterococcus faecalis - MIC*    AMPICILLIN <=2 SENSITIVE Sensitive     LINEZOLID 2 SENSITIVE Sensitive     CIPROFLOXACIN Value in next row Resistant      RESISTANT>=8    TETRACYCLINE Value in next row Resistant      RESISTANT>=16    VANCOMYCIN Value in next row Resistant      RESISTANT>=32    GENTAMICIN SYNERGY Value in next row Resistant      RESISTANT>=32    * ENTEROCOCCUS FAECALIS  Culture, respiratory (NON-Expectorated)     Status: None   Collection Time: 09/16/15  3:50 PM  Result Value Ref Range Status   Specimen  Description TRACHEAL ASPIRATE  Final   Special Requests Immunocompromised  Final   Gram Stain   Final    FEW WBC SEEN GOOD SPECIMEN - 80-90% WBCS RARE GRAM NEGATIVE RODS    Culture LIGHT GROWTH PSEUDOMONAS AERUGINOSA  Final   Report Status 09/19/2015 FINAL  Final  Organism ID, Bacteria PSEUDOMONAS AERUGINOSA  Final      Susceptibility   Pseudomonas aeruginosa - MIC*    CEFTAZIDIME 8 SENSITIVE Sensitive     CIPROFLOXACIN 2 INTERMEDIATE Intermediate     GENTAMICIN >=16 RESISTANT Resistant     IMIPENEM >=16 RESISTANT Resistant     PIP/TAZO Value in next row Sensitive      SENSITIVE32    CEFEPIME Value in next row Sensitive      SENSITIVE8    LEVOFLOXACIN Value in next row Resistant      RESISTANT>=8    * LIGHT GROWTH PSEUDOMONAS AERUGINOSA  Culture, expectorated sputum-assessment     Status: None   Collection Time: 09/22/15  2:09 PM  Result Value Ref Range Status   Specimen Description ENDOTRACHEAL  Final   Special Requests Normal  Final   Sputum evaluation THIS SPECIMEN IS ACCEPTABLE FOR SPUTUM CULTURE  Final   Report Status 09/24/2015 FINAL  Final  Culture, respiratory (NON-Expectorated)     Status: None   Collection Time: 09/22/15  2:09 PM  Result Value Ref Range Status   Specimen Description ENDOTRACHEAL  Final   Special Requests Normal Reflexed from [id number]  Final   Gram Stain   Final    FEW WBC SEEN FEW GRAM NEGATIVE RODS POOR SPECIMEN - LESS THAN 70% WBCS    Culture   Final    MODERATE GROWTH PSEUDOMONAS AERUGINOSA LIGHT GROWTH KLEBSIELLA PNEUMONIAE REFER TO SENSITIVITIES FROM PREVIOUS CULTURE FOR ORG 2    Report Status 10/01/2015 FINAL  Final   Organism ID, Bacteria PSEUDOMONAS AERUGINOSA  Final   Organism ID, Bacteria KLEBSIELLA PNEUMONIAE  Final      Susceptibility   Pseudomonas aeruginosa - MIC*    CEFTAZIDIME 4 SENSITIVE Sensitive     CIPROFLOXACIN 2 INTERMEDIATE Intermediate     GENTAMICIN >=16 RESISTANT Resistant     IMIPENEM >=16 RESISTANT Resistant      PIP/TAZO Value in next row Sensitive      SENSITIVE16    * MODERATE GROWTH PSEUDOMONAS AERUGINOSA  Tissue culture     Status: None   Collection Time: 09/24/15  6:44 AM  Result Value Ref Range Status   Specimen Description BONE  Final   Special Requests Normal  Final   Gram Stain MODERATE WBC SEEN FEW GRAM NEGATIVE RODS   Final   Culture   Final    MODERATE GROWTH ESCHERICHIA COLI MODERATE GROWTH KLEBSIELLA PNEUMONIAE ESBL-EXTENDED SPECTRUM BETA LACTAMASE-THE ORGANISM IS RESISTANT TO PENICILLINS, CEPHALOSPORINS AND AZTREONAM ACCORDING TO CLSI [id number] VOL.25 N01 JAN 2005. ORGANISM 1 This organism isolate is resistant to one or more antiotic agents in three or more antimicrobial categories.  Suggest Infectious Disease consult.   ORGANISM 2 CRITICAL RESULT CALLED TO, READ BACK BY AND VERIFIED WITH: RN Mardene Celeste LINDSAY 09/27/15 1005AM    Report Status 09/28/2015 FINAL  Final   Organism ID, Bacteria ESCHERICHIA COLI  Final   Organism ID, Bacteria KLEBSIELLA PNEUMONIAE  Final      Susceptibility   Escherichia coli - MIC*    AMPICILLIN >=32 RESISTANT Resistant     CEFTAZIDIME 4 RESISTANT Resistant     CEFAZOLIN >=64 RESISTANT Resistant     CEFTRIAXONE 8 RESISTANT Resistant     GENTAMICIN <=1 SENSITIVE Sensitive     IMIPENEM 8 RESISTANT Resistant     TRIMETH/SULFA <=20 SENSITIVE Sensitive     Extended ESBL POSITIVE Resistant     PIP/TAZO Value in next row Resistant      RESISTANT>=128    *  MODERATE GROWTH ESCHERICHIA COLI   Klebsiella pneumoniae - MIC*    AMPICILLIN Value in next row Resistant      RESISTANT>=128    CEFTAZIDIME Value in next row Resistant      RESISTANT>=128    CEFAZOLIN Value in next row Resistant      RESISTANT>=128    CEFTRIAXONE Value in next row Resistant      RESISTANT>=128    CIPROFLOXACIN Value in next row Resistant      RESISTANT>=128    GENTAMICIN Value in next row Resistant      RESISTANT>=128    IMIPENEM Value in next row Resistant       RESISTANT>=128    TRIMETH/SULFA Value in next row Sensitive      RESISTANT>=128    PIP/TAZO Value in next row Resistant      RESISTANT>=128    * MODERATE GROWTH KLEBSIELLA PNEUMONIAE  Anaerobic culture     Status: None   Collection Time: 09/24/15  3:55 PM  Result Value Ref Range Status   Specimen Description BONE  Final   Special Requests Normal  Final   Culture NO ANAEROBES ISOLATED  Final   Report Status 09/29/2015 FINAL  Final  C difficile quick scan w PCR reflex     Status: None   Collection Time: 10/07/15  2:02 PM  Result Value Ref Range Status   C Diff antigen NEGATIVE NEGATIVE Final   C Diff toxin NEGATIVE NEGATIVE Final   C Diff interpretation Negative for C. difficile  Final    Coagulation Studies: No results for input(s): LABPROT, INR in the last 72 hours.  Urinalysis: No results for input(s): COLORURINE, LABSPEC, PHURINE, GLUCOSEU, HGBUR, BILIRUBINUR, KETONESUR, PROTEINUR, UROBILINOGEN, NITRITE, LEUKOCYTESUR in the last 72 hours.  Invalid input(s): APPERANCEUR    Imaging: No results found.   Medications:   . feeding supplement (VITAL AF 1.2 CAL) 1,000 mL (10/14/15 0523)  . norepinephrine Stopped (10/08/15 0530)   . albumin human  25 g Intravenous Once  . [START ON 10/17/2015] amantadine  200 mg Per Tube Q7 days  . antiseptic oral rinse  7 mL Mouth Rinse QID  . aspirin  81 mg Oral Daily  . calcium acetate (Phos Binder)  667 mg Oral TID WC  . chlorhexidine gluconate  15 mL Mouth Rinse BID  . epoetin (EPOGEN/PROCRIT) injection  10,000 Units Intravenous Q M,W,F-HD  . escitalopram  10 mg Oral Daily  . feeding supplement (PRO-STAT SUGAR FREE 64)  30 mL Oral 6 X Daily  . folic acid  1 mg Oral Daily  . free water  20 mL Per Tube 6 times per day  . heparin subcutaneous  5,000 Units Subcutaneous Q12H  . hydrocortisone  10 mg Per Tube BID  . insulin aspart  0-9 Units Subcutaneous 6 times per day  . ipratropium-albuterol  3 mL Nebulization QID  . lidocaine  1  patch Transdermal Q24H  . loperamide  2 mg Oral Daily  . midodrine  5 mg Per Tube TID WC  . multivitamin  5 mL Oral Daily  . pantoprazole sodium  40 mg Per Tube Daily  . sodium chloride  10-40 mL Intracatheter Q12H  . sulfamethoxazole-trimethoprim  2.5 tablet Per Tube q1800  . thiamine  100 mg Per Tube Daily   sodium chloride, sodium chloride, acetaminophen (TYLENOL) oral liquid 160 mg/5 mL, HYDROmorphone (DILAUDID) injection, morphine injection, ondansetron (ZOFRAN) IV, oxyCODONE, sodium chloride, zinc oxide  Assessment/ Plan:  49 y.o. 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. Admitted on 08-15-2015  1. End-stage renal disease on hemodialysis on HD MWF. The patient has been on dialysis since October of 2014. R IJ permcath. Dialysis treatment with IV albumin for oncotic support.  - Patient due for hemodialysis today. She will require support with albumin again. Ultrafiltration target is 0.5-1 kg today.  2. Anemia of CKD: Hemoglobin has drifted down to 7.5. We will continue Epogen for anemia support. Consider blood transfusion for hemoglobin of less than 7.  3. Secondary hyperparathyroidism: PTH low at 55 (9/25) -Follow-up serum phosphorus today. No indication for activated vitamin D.   4. Sepsis: VRE in blood. Bone cultures E. Coli and klebsiella - Treatment as per ID and ICU team: daptomycin completed on 11/10 and now  on trimethoprim/sulfamethoxazole.   5. Generalized Dependent edema/Anasarca due to 3rd spacing -  Ultrafiltration target 0.5-1 kg today. Use albumin for blood pressure support.  6. Acute resp failure - Patient remains on the ventilator at this point in time.   LOS: 63 Gerard Cantara 11/25/20167:51 AM

## 2015-10-14 NOTE — Progress Notes (Signed)
Nutrition Follow-up  DOCUMENTATION CODES:   Severe malnutrition in context of acute illness/injury  INTERVENTION:   EN: no change in poc at present, continue TF as tolerated as per MD orders; continue to participate in ICU rounds, document at least weekly  NUTRITION DIAGNOSIS:   Inadequate oral intake related to wound healing, acute illness, chronic illness as evidenced by NPO status, estimated needs.  GOAL:   Patient will meet greater than or equal to 90% of their needs  MONITOR:    (Energy Intake, Anthropometrics, Digestive System, Electrolyte/Renal Profile, Glucose Profile)  REASON FOR ASSESSMENT:   Consult Enteral/tube feeding initiation and management  ASSESSMENT:    Pt remains on vent via trach, trach collar trials as tolerated; HD as scheduled  EN: tolerating Vital 1.2 at rate of 50 ml/hr, Prostat q 4 hours via dobhoff     Skin:   (stage IV sacrum, stage III hip, unstageable foot; rectal ulcer from fleixseal, ulcer in nose from dobhoff)  Digestive System: last BM documented at 1800 on 11/24 (large loose brown BM), loose stool continues, 1-3 BMs per day on average  Electrolyte and Renal Profile:  Recent Labs Lab 10/07/15 1600  10/10/15 0613 10/12/15 0636 10/14/15 0514  BUN 64*  < > 100* 84* 74*  CREATININE 0.98  < > 1.63* 1.42* 1.20*  NA 141  < > 141 142 145  K 4.3  < > 5.2* 4.7 4.2  PHOS 3.4  --   --  5.1*  --   < > = values in this interval not displayed. Glucose Profile:  Recent Labs  10/14/15 0351 10/14/15 0747 10/14/15 1212  GLUCAP 93 129* 165*   Meds: reviewed  Height:   Ht Readings from Last 1 Encounters:  Aug 22, 2015 5\' 9"  (1.753 m)    Weight: last measured wt this admission 213 pounds on 11/17  Wt Readings from Last 1 Encounters:  01/31/12 319 lb 8 oz (144.924 kg)    BMI:  Body mass index is 31.57 kg/(m^2).  Estimated Nutritional Needs:   Kcal:  TEE 1851 kcals/d (Ve 7.4, Tmax 37.4) Using current wt (97kg) edema noted  (re-intubated)  Protein:  (1.5-2.0 g/kg)145-194 g/d but likely closer to 2.0-2.5 g/kg Using current wt of 97kg  Fluid:  1000ml + UOP  EDUCATION NEEDS:   No education needs identified at this time  LOW Care Level  Romelle Starcherate Calene Paradiso MS, RD, LDN [phone] Pager

## 2015-10-14 NOTE — Progress Notes (Signed)
Rested well during shift. Sinus tach with PVCs.Went into non-sustained SVT at 3:00 when she was turned onto her side to be cleaned up, SVT resolved Remained on vent on rate control during shift. Tolerating tube feeds. Daughter visited this evening.

## 2015-10-14 NOTE — Care Management (Signed)
Attempted to discuss [facility] referral with [facility].  It appears agency is closed for the holidays.  Investing the following facilities- [facility], [facility], [facility] [place]- left message., [facility] [place]- left message.

## 2015-10-15 LAB — GLUCOSE, CAPILLARY
Glucose-Capillary: 128 mg/dL — ABNORMAL HIGH (ref 65–99)
Glucose-Capillary: 124 mg/dL — ABNORMAL HIGH (ref 65–99)
Glucose-Capillary: 164 mg/dL — ABNORMAL HIGH (ref 65–99)
Glucose-Capillary: 126 mg/dL — ABNORMAL HIGH (ref 65–99)

## 2015-10-15 LAB — PHOSPHORUS: PHOSPHORUS: 1.9 mg/dL — AB (ref 2.5–4.6)

## 2015-10-15 MED ORDER — ALBUMIN HUMAN 25 % IV SOLN
12.5000 g | Freq: Once | INTRAVENOUS | Status: AC
  Administered 2015-10-15: 12.5 g via INTRAVENOUS
  Filled 2015-10-15: qty 50

## 2015-10-15 NOTE — Progress Notes (Signed)
Physical Therapy Treatment Patient Details Name: Courtney [name] MRN: [id number] DOB: 09-23-1966 Today's Date: 10/15/2015    History of Present Illness Pt is a 20 F who has been in medical facilities (hosp, LTAC, rehab) for 2 yrs following gastric bypass surgery with multiple complications. Now with chronic trach, ESRD, profound debilitation, severe sacral pressure ulcer (stage IV). Was seemingly making progress and transferred to rehab facility approx one week prior to this admission. She was sent to [facility] ED 9/23 with AMS and hypotension. Admitted with sepsis related to infected sacral ulcer.  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  Patient transferred to CCU to med-surg floor during hospitalization, but experienced profound interdialytic hypotension requiring return transfer to CCU for use of pressors (now off); remains in CCU.  Noted with R hip intertrochanteric fracture, recommended for conservative management per orthopedics.  Hospital course continues to be complicated with patient requiring return to vent 11/5 for respiratory decline with use of pressors (now off); extubated to trach collar 11/10; limited ability/willingness to follow commands or actively participate in therapeutic interventions.  Per care team, physicians recommending hospice care/comfort measures at this time; husband considering goals of care/POC pending discussion with patient and other family members, but wishes continued medical care at this time. Will carefully monitor medical plan and POC to evaluate appropriateness of continued PT throughout remaining hospitalization.    PT Comments    Pt continues to be very limited with what she is able to do with only very limited ability to give AAROM effort on the L (along with apparent pain during most acts) and no  active assist with very gentle R LE exercises (husband makes specific request to work with R side despite hip issues, pt shows no pain).  Pt continues to look uncomfortable with most acts.   Follow Up Recommendations  LTACH;SNF     Equipment Recommendations       Recommendations for Other Services       Precautions / Restrictions Precautions Precautions: Fall Restrictions RLE Weight Bearing: Non weight bearing    Mobility  Bed Mobility               General bed mobility comments: Deferred/contraindicated  Transfers                    Ambulation/Gait                 Stairs            Wheelchair Mobility    Modified Rankin (Stroke Patients Only)       Balance                                    Cognition Arousal/Alertness: Lethargic Behavior During Therapy: Flat affect (does appear to make better eye contact today)                        Exercises General Exercises - Lower Extremity Ankle Circles/Pumps: PROM;AAROM;10 reps;Both Quad Sets: AAROM;Both;10 reps Heel Slides: Both;10 reps;AROM Hip ABduction/ADduction: PROM;AAROM;10 reps;Both    General Comments        Pertinent Vitals/Pain Pain Assessment:  (pt with typical grimacing with exercises, L>R)    Home Living  Prior Function            PT Goals (current goals can now be found in the care plan section)      Frequency  Min 2X/week    PT Plan Current plan remains appropriate    Co-evaluation             End of Session   Activity Tolerance: Patient limited by pain Patient left: in bed;with family/visitor present     Time: 1325-1350 PT Time Calculation (min) (ACUTE ONLY): 25 min  Charges:  $Therapeutic Exercise: 23-37 mins                    G Codes:     Courtney SentersGalen Amandy [name], PT, DPT [phone]  Courtney ProGalen R Maliek [name] 10/15/2015, 4:25 PM

## 2015-10-15 NOTE — Progress Notes (Signed)
[facility] Critical Care Medicine Progess Note  Name: Courtney [name] MRN: [id number] DOB: 1965-12-20    ADMISSION DATE:  11-Sep-2015     INITIAL PRESENTATION:  49 AAF who has been in medical facilities (hosp, LTAC, rehab) for 2 yrs following gastric bypass surgery with multiple complications. Now with chronic trach, ESRD, profound debilitation, severe sacral pressure ulcer. Was seemingly making progress and transferred to rehab facility approx one week prior to this admission. She was sent to [facility] ED with AMS and hypotension. Working dx of severe sepsis/septic shock due to infected sacral pressure ulcer. Since admission. Her course is been very complicated with numerous complications including septic shock and GI bleeding.    INTERVAL HISTORY:  The patient is off the ventilator this morning, she is back on trach collar. She is awake and alert and has no particular complaints. The patient's husband is in the room. He was updated on the patient's care. Our notes, the patient continued to have diarrhea overnight, patient has concerns about the patient's diarrhea and his tube feeds. When necessary asked me about potentially placing a rectal tube.    Subjective: 11/24 >> awake and following commands, tolerating PRVC, will attempt PSV mode and cont with current medical plan. AM labs.   11/25>>awake following command, plan for PSV mode, HD today. Husband updated.   11/26>>awake and following commands, on FiO2 28%, no HD yesterday due to holiday schedule. Plan for 1L removal during HD today.  Mild temp elevation overnight, required tylenol, mild inc in WBC.   MAJOR EVENTS/TEST RESULTS: Admission 02/07/14-05/07/14 Admission 07/21/14-09/06/14 Discharged to [facility]. Pt had palliative consult at that time, were asked to sign off by husband.  09/23 CT head: NAD 09/23 EEG: no epileptiform activity 09/23 PRBCs for Hgb 6.4 09/24 bedside debridement of sacral wound. Abscess drained 09/25 Off  vasopressors. More alert. No distress. Worsening thrombocytopenia. Vanc DC'd 09/29  Dr. Sampson Goon (I.D) excused from the case by patient's husband. 10/02 MRI -multiple infarcts 10/03 tracheal bleeding- transfused platelets 10/03 hospitalist service excused from the case by patient's husband 10/03 Echocardiogram ejection fraction was 55-60%, pulmonary systolic pressure was 39 mmHg 40/98 restart TF's at lower rate, attempt reg HD  10/12 Transferred to med-surg floor. Remains on PCCM service 10/14 SLP eval: pt unable to tolerate PMV adequately 10/17-will re-attempt PM valve-discussed with Speech therapist 10/18 passed swallow eval-start pureed thick foods no thin liquids-continue NG feeds 10/19 transferred to step down for sepsis/aspiration pneumonia 10/19 cxr shows RLL opacity 10/21 sacral decub debride at bedside by surgery 10/22 started back on vasopressors while on HD 10/27 CT with osteo, R hip fx, unable to identify tip of dubhoff tube - sent for fluoro study 10/28 Ortho consultation: I do not feel that she is a surgical candidate. Therefore, I feel that it would best to manage this fracture nonsurgically and allow it to heal by itself over time, which it should.  10/28 Gen Surg consultation: Due to the lack of free air or free flow of contrast and the peritoneum there is no indication for any surgical intervention on this. Would recommend pulling back the feeding tube 1-2 cm. Would recheck an abdominal film to confirm no pneumoperitoneum in the morning. Absence of any changes okay to continue using Dobbhoff for feeding and medications. 10/28 gastrograffin study: The study confirms that the feeding tube pes perforated through the duodenum and the tip is within a cavity that fills with injected contrast. The cavity does appear walled off 11/4 refuses oral feeds, continue  TF's CT reviewed: T8-T9 discitis/osteomyelitis, RT HIP FRACTURE 11/5 placed back on vasopressors, placed back on Vent due to  resp acidosis, levophed turned off 11/6 afternoon 11/5 PM Trach changed out due to cuff leak, #6 Shiley cuffed, 11/6 dubhoff occluded, removed and replaced, new dubhoff shows tube in the antral stomach 11/15 refusing to take oral feeds 11/18 placed back on Vent for increased WOB,SOB  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 Pneumoniae, P. Mirabilis, VRE Wound 9/26 >> No growth Blood 9/23 >> NEG CDiff 9/27>>neg Stool Cx 10/15>> negative Cdiff 10/25>>neg Trach Aspirate 10/28>> light growth pseudomonas Blood 10/28 >> 1/2 GPC >>  Sputum cultures obtained 09/22/15 due to mucus plugging>>Pseudomonas BONE TISSUE Cx 11/3>>E.coli (ESBL), k. Pneumonia (daptomycin and septra)  ANTIMICROBIALS:  Aztreonam 9/23 >> 9/24 Vanc 9/23 >> 9/25 Vanc 9/26>>9/27 Daptomycin 9/27>> 10/12, 10/28>>  Meropenem 9/23 >> 10/14, 10/28 >> 10/31 Septra 11/8>>   VITAL SIGNS: Temp:  [99.3 F (37.4 C)-100.8 F (38.2 C)] 99.3 F (37.4 C) (11/26 0700) Pulse Rate:  [106-148] 106 (11/26 0930) Resp:  [17-39] 27 (11/26 0930) BP: (109-142)/(54-101) 119/66 mmHg (11/26 0930) SpO2:  [92 %-100 %] 97 % (11/26 0910) FiO2 (%):  [28 %-30 %] 28 % (11/26 0800) HEMODYNAMICS:   VENTILATOR SETTINGS: Vent Mode:  [-] PRVC FiO2 (%):  [28 %-30 %] 28 % Set Rate:  [14 bmp-15 bmp] 14 bmp Vt Set:  [500 mL] 500 mL PEEP:  [5 cmH20] 5 cmH20 INTAKE / OUTPUT:  Intake/Output Summary (Last 24 hours) at 10/15/15 0949 Last data filed at 10/15/15 0800  Gross per 24 hour  Intake    145 ml  Output      0 ml  Net    145 ml    PHYSICAL EXAMINATION: Physical Examination:   VS: BP  119/66 mmHg  Pulse 106  Temp(Src) 99.3 F (37.4 C) (Oral)  Resp 27  Ht 5\' 9"  (1.753 m)  Wt 213 lb 13.5 oz (97 kg)  BMI 31.57 kg/m2  SpO2 97%  LMP  (LMP Unknown)  General Appearance: No resp distress, coarse upper airway sounds, off vent, on TCT Neuro: profoundly diffusely weak, alert today, nodding head HEENT: cushingoid facies, PERRLA, EOM intact Neck: trach site clean Pulmonary: clear anteriorly Cardiovascular: reg, + syst murmur Abdomen: soft, NT, +BS Extremities: warm, R>L UE edema Examination of the sacral decubitus ulcer- showed a very deep ulcer approximately 7-8 cm across.   stage IV ulcer.   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 Diff Stool incontinence - husband has refused to allow rectal tube placement Acute on chronic anemia Thrombocytopenia, resolved - HIT panel negative 9/25 Recurrent  Severe sepsis, due to sacral pressure ulcer ID following  DM 2, controlled.  Chronic steroid therapy, for a history of of adrenal insufficiency Incidental finding of R hip fx - conservative mgmt. Waxing and waning encephalopathy - she was evaluated by both the psychiatry and neurology services, and it was deemed that the patient lacks decisional capacity at this time. 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 Husband declined TEE Profound deconditioning-criticall illness neuropathy Chronic pain  Cont vent support - settings reviewed and/or adjusted Wean in PSV <> ATC as tolerated Cont vent bundle Daily SBT if/when meets  criteria Monitor BMET intermittently Monitor I/Os Correct electrolytes as indicated Intermittent HD per Renal service Cont TFs as tolerated Cont SUP Cont DVT px Cont TMP-SMX per ID service DVT px: SQ heparin Monitor CBC intermittently Transfuse per usual ICU guidelines Cont analgesics PRN Cont to work on LTACH placement CXR and Labs in the AM - mild temp elevation overnight, mild inc in WBC  Husband updated via telephone   I have personally obtained a history, examined the patient, evaluated laboratory and imaging results, formulated the assessment and plan and placed orders. CRITICAL CARE: The patient is critically ill with multiple organ systems failure and requires high complexity decision making for assessment and support, frequent evaluation and titration of therapies, application of advanced monitoring technologies and extensive interpretation of multiple databases.   Critical Care Time devoted to patient care services described in this note is 35 minutes.    Stephanie Acre, MD  Pulmonary and Critical Care Pager [phone] (please enter 7-digits) On Call Pager - [phone] (please enter 7-digits)

## 2015-10-15 NOTE — Progress Notes (Signed)
Subjective:  Dialysis was unable to be performed yesterday secondary to nursing scheduling issue. Therefore patient is being dialyzed this a.m. Remains tachycardic and on the ventilator. Albumin to be given prior to dialysis. Remains on tube feeds.    Objective:  Vital signs in last 24 hours:  Temp:  [99.3 F (37.4 C)-100.8 F (38.2 C)] 99.3 F (37.4 C) (11/26 0700) Pulse Rate:  [109-148] 109 (11/26 0800) Resp:  [17-39] 23 (11/26 0800) BP: (109-142)/(54-101) 120/80 mmHg (11/26 0800) SpO2:  [92 %-100 %] 97 % (11/26 0800) FiO2 (%):  [28 %-30 %] 28 % (11/26 0800)  Weight change:  Filed Weights    Intake/Output: I/O last 3 completed shifts: In: 460 [I.V.:10; NG/GT:450] Out: 1 [Stool:1]   Intake/Output this shift:  Total I/O In: 135 [NG/GT:135] Out: -   Physical Exam: General: No acute distress, chronically ill appearing  Head: NG in place, OM moist  Eyes: anicteric  Neck: Tracheostomy in place, vent assisted  Lungs:  Scattered rhonchi, normal effort  Heart: S1S2 +tachycardia  Abdomen:  Soft, nontender  Extremities: +++ dependent edema, anasarca  Neurologic: Lethargic but arousable  Access:  R IJ permcath.       Basic Metabolic Panel:  Recent Labs Lab 10/09/15 0506 10/10/15 0613 10/12/15 0636 10/14/15 0514  NA 139 141 142 145  K 4.7 5.2* 4.7 4.2  CL 100* 99* 101 105  CO2 32 31 32 32  GLUCOSE 123* 81 136* 109*  BUN 91* 100* 84* 74*  CREATININE 1.35* 1.63* 1.42* 1.20*  CALCIUM 8.4* 8.5* 8.5* 8.5*  PHOS  --   --  5.1*  --     Liver Function Tests:  Recent Labs Lab 10/12/15 0636  ALBUMIN 2.2*   No results for input(s): LIPASE, AMYLASE in the last 168 hours. No results for input(s): AMMONIA in the last 168 hours.  CBC:  Recent Labs Lab 10/09/15 0506 10/10/15 0613 10/12/15 0636 10/14/15 0514  WBC 9.1 7.7 8.7 12.3*  NEUTROABS  --   --   --  10.9*  HGB 8.0* 8.4* 8.0* 7.5*  HCT 25.7* 26.6* 25.9* 25.0*  MCV 102.7* 101.3* 102.5* 101.6*  PLT  191 195 190 183    Cardiac Enzymes: No results for input(s): CKTOTAL, CKMB, CKMBINDEX, TROPONINI in the last 168 hours.  BNP: Invalid input(s): POCBNP  CBG:  Recent Labs Lab 10/14/15 0747 10/14/15 1212 10/14/15 1638 10/14/15 2202 10/15/15 0456  GLUCAP 129* 165* 185* 138* 128*    Microbiology: Results for orders placed or performed during the hospital encounter of 08/01/2015  Blood Culture (routine x 2)     Status: None   Collection Time: 07/31/2015  8:51 AM  Result Value Ref Range Status   Specimen Description BLOOD [name]  Final   Special Requests BOTTLES DRAWN AEROBIC AND ANAEROBIC  3CC  Final   Culture NO GROWTH 5 DAYS  Final   Report Status 08/17/2015 FINAL  Final  Blood Culture (routine x 2)     Status: None   Collection Time: 08/01/2015  9:20 AM  Result Value Ref Range Status   Specimen Description BLOOD LEFT ARM  Final   Special Requests BOTTLES DRAWN AEROBIC AND ANAEROBIC  1CC  Final   Culture NO GROWTH 5 DAYS  Final   Report Status 08/17/2015 FINAL  Final  Wound culture     Status: None   Collection Time: 08/06/2015  9:20 AM  Result Value Ref Range Status   Specimen Description DECUBITIS  Final   Special Requests  Normal  Final   Gram Stain   Final    FEW WBC SEEN MANY GRAM NEGATIVE RODS RARE GRAM POSITIVE COCCI    Culture   Final    HEAVY GROWTH ESCHERICHIA COLI MODERATE GROWTH ENTEROBACTER AEROGENES PROTEUS MIRABILIS HEAVY GROWTH ENTEROCOCCUS SPECIES VRE HAVE INTRINSIC RESISTANCE TO MOST COMMONLY USED ANTIBIOTICS AND THE ABILITY TO ACQUIRE RESISTANCE TO MOST AVAILABLE ANTIBIOTICS.    Report Status 08/16/2015 FINAL  Final   Organism ID, Bacteria ESCHERICHIA COLI  Final   Organism ID, Bacteria ENTEROBACTER AEROGENES  Final   Organism ID, Bacteria PROTEUS MIRABILIS  Final   Organism ID, Bacteria ENTEROCOCCUS SPECIES  Final      Susceptibility   Enterobacter aerogenes - MIC*    CEFTAZIDIME <=1 SENSITIVE Sensitive     CEFAZOLIN >=64 RESISTANT Resistant      CEFTRIAXONE <=1 SENSITIVE Sensitive     CIPROFLOXACIN <=0.25 SENSITIVE Sensitive     GENTAMICIN <=1 SENSITIVE Sensitive     IMIPENEM 1 SENSITIVE Sensitive     TRIMETH/SULFA <=20 SENSITIVE Sensitive     * MODERATE GROWTH ENTEROBACTER AEROGENES   Escherichia coli - MIC*    AMPICILLIN <=2 SENSITIVE Sensitive     CEFTAZIDIME <=1 SENSITIVE Sensitive     CEFAZOLIN <=4 SENSITIVE Sensitive     CEFTRIAXONE <=1 SENSITIVE Sensitive     CIPROFLOXACIN <=0.25 SENSITIVE Sensitive     GENTAMICIN <=1 SENSITIVE Sensitive     IMIPENEM <=0.25 SENSITIVE Sensitive     TRIMETH/SULFA <=20 SENSITIVE Sensitive     * HEAVY GROWTH ESCHERICHIA COLI   Proteus mirabilis - MIC*    AMPICILLIN >=32 RESISTANT Resistant     CEFTAZIDIME <=1 SENSITIVE Sensitive     CEFAZOLIN 8 SENSITIVE Sensitive     CEFTRIAXONE <=1 SENSITIVE Sensitive     CIPROFLOXACIN <=0.25 SENSITIVE Sensitive     GENTAMICIN <=1 SENSITIVE Sensitive     IMIPENEM 1 SENSITIVE Sensitive     TRIMETH/SULFA <=20 SENSITIVE Sensitive     * PROTEUS MIRABILIS   Enterococcus species - MIC*    AMPICILLIN >=32 RESISTANT Resistant     VANCOMYCIN >=32 RESISTANT Resistant     GENTAMICIN SYNERGY SENSITIVE Sensitive     TETRACYCLINE Value in next row Resistant      RESISTANT>=16    * HEAVY GROWTH ENTEROCOCCUS SPECIES  MRSA PCR Screening     Status: None   Collection Time: 08/11/2015  2:38 PM  Result Value Ref Range Status   MRSA by PCR NEGATIVE NEGATIVE Final    Comment:        The GeneXpert MRSA Assay (FDA approved for NASAL specimens only), is one component of a comprehensive MRSA colonization surveillance program. It is not intended to diagnose MRSA infection nor to guide or monitor treatment for MRSA infections.   Blood culture (single)     Status: None   Collection Time: 08/11/2015  3:36 PM  Result Value Ref Range Status   Specimen Description BLOOD RIGHT ASSIST CONTROL  Final   Special Requests BOTTLES DRAWN AEROBIC AND ANAEROBIC  Final    Culture NO GROWTH 5 DAYS  Final   Report Status 08/17/2015 FINAL  Final  Wound culture     Status: None   Collection Time: 08/13/15 12:37 PM  Result Value Ref Range Status   Specimen Description WOUND  Final   Special Requests Normal  Final   Gram Stain   Final    FEW WBC SEEN TOO NUMEROUS TO COUNT GRAM NEGATIVE RODS FEW GRAM POSITIVE  COCCI    Culture   Final    HEAVY GROWTH ESCHERICHIA COLI MODERATE GROWTH PROTEUS MIRABILIS LIGHT GROWTH KLEBSIELLA PNEUMONIAE MODERATE GROWTH ENTEROCOCCUS GALLINARUM CRITICAL RESULT CALLED TO, READ BACK BY AND VERIFIED WITH: [facility] BORBA AT 1042 08/16/15 DV    Report Status 08/17/2015 FINAL  Final   Organism ID, Bacteria ESCHERICHIA COLI  Final   Organism ID, Bacteria PROTEUS MIRABILIS  Final   Organism ID, Bacteria KLEBSIELLA PNEUMONIAE  Final   Organism ID, Bacteria ENTEROCOCCUS GALLINARUM  Final      Susceptibility   Escherichia coli - MIC*    AMPICILLIN >=32 RESISTANT Resistant     CEFTAZIDIME 4 RESISTANT Resistant     CEFAZOLIN >=64 RESISTANT Resistant     CEFTRIAXONE 16 RESISTANT Resistant     GENTAMICIN 2 SENSITIVE Sensitive     IMIPENEM >=16 RESISTANT Resistant     TRIMETH/SULFA <=20 SENSITIVE Sensitive     Extended ESBL POSITIVE Resistant     PIP/TAZO Value in next row Resistant      RESISTANT>=128    CIPROFLOXACIN Value in next row Sensitive      SENSITIVE<=0.25    * HEAVY GROWTH ESCHERICHIA COLI   Klebsiella pneumoniae - MIC*    AMPICILLIN Value in next row Resistant      SENSITIVE<=0.25    CEFTAZIDIME Value in next row Resistant      SENSITIVE<=0.25    CEFAZOLIN Value in next row Resistant      SENSITIVE<=0.25    CEFTRIAXONE Value in next row Resistant      SENSITIVE<=0.25    CIPROFLOXACIN Value in next row Resistant      SENSITIVE<=0.25    GENTAMICIN Value in next row Sensitive      SENSITIVE<=0.25    IMIPENEM Value in next row Resistant      SENSITIVE<=0.25    TRIMETH/SULFA Value in next row Resistant       SENSITIVE<=0.25    PIP/TAZO Value in next row Resistant      RESISTANT>=128    * LIGHT GROWTH KLEBSIELLA PNEUMONIAE   Proteus mirabilis - MIC*    AMPICILLIN Value in next row Resistant      RESISTANT>=128    CEFTAZIDIME Value in next row Sensitive      RESISTANT>=128    CEFAZOLIN Value in next row Sensitive      RESISTANT>=128    CEFTRIAXONE Value in next row Sensitive      RESISTANT>=128    CIPROFLOXACIN Value in next row Sensitive      RESISTANT>=128    GENTAMICIN Value in next row Sensitive      RESISTANT>=128    IMIPENEM Value in next row Sensitive      RESISTANT>=128    TRIMETH/SULFA Value in next row Sensitive      RESISTANT>=128    PIP/TAZO Value in next row Sensitive      SENSITIVE<=4    * MODERATE GROWTH PROTEUS MIRABILIS   Enterococcus gallinarum - MIC*    AMPICILLIN Value in next row Resistant      SENSITIVE<=4    GENTAMICIN SYNERGY Value in next row Sensitive      SENSITIVE<=4    CIPROFLOXACIN Value in next row Resistant      RESISTANT>=8    TETRACYCLINE Value in next row Resistant      RESISTANT>=16    * MODERATE GROWTH ENTEROCOCCUS GALLINARUM  Wound culture     Status: None   Collection Time: 08/15/15  2:53 PM  Result Value Ref Range Status  Specimen Description WOUND  Final   Special Requests NONE  Final   Gram Stain FEW WBC SEEN NO ORGANISMS SEEN   Final   Culture NO GROWTH 3 DAYS  Final   Report Status 08/18/2015 FINAL  Final  C difficile quick scan w PCR reflex     Status: None   Collection Time: 08/17/15 11:34 AM  Result Value Ref Range Status   C Diff antigen NEGATIVE NEGATIVE Final   C Diff toxin NEGATIVE NEGATIVE Final   C Diff interpretation Negative for C. difficile  Final  Stool culture     Status: None   Collection Time: 09/03/15  3:51 PM  Result Value Ref Range Status   Specimen Description STOOL  Final   Special Requests Immunocompromised  Final   Culture   Final    NO SALMONELLA OR SHIGELLA ISOLATED No Pathogenic E. coli  detected NO CAMPYLOBACTER DETECTED    Report Status 09/07/2015 FINAL  Final  C difficile quick scan w PCR reflex     Status: None   Collection Time: 09/13/15 12:51 AM  Result Value Ref Range Status   C Diff antigen NEGATIVE NEGATIVE Final   C Diff toxin NEGATIVE NEGATIVE Final   C Diff interpretation Negative for C. difficile  Final  Culture, blood (routine x 2)     Status: None   Collection Time: 09/16/15 11:24 AM  Result Value Ref Range Status   Specimen Description BLOOD LEFT HAND  Final   Special Requests BOTTLES DRAWN AEROBIC AND ANAEROBIC  1CC  Final   Culture NO GROWTH 5 DAYS  Final   Report Status 09/21/2015 FINAL  Final  Culture, blood (routine x 2)     Status: None   Collection Time: 09/16/15 12:23 PM  Result Value Ref Range Status   Specimen Description BLOOD RIGHT HAND  Final   Special Requests BOTTLES DRAWN AEROBIC AND ANAEROBIC  1CC  Final   Culture  Setup Time   Final    GRAM POSITIVE COCCI AEROBIC BOTTLE ONLY CRITICAL RESULT CALLED TO, READ BACK BY AND VERIFIED WITH: TESS THOMAS,RN 09/17/2015 0631 BY JRS.    Culture   Final    ENTEROCOCCUS FAECALIS AEROBIC BOTTLE ONLY VRE HAVE INTRINSIC RESISTANCE TO MOST COMMONLY USED ANTIBIOTICS AND THE ABILITY TO ACQUIRE RESISTANCE TO MOST AVAILABLE ANTIBIOTICS. CRITICAL RESULT CALLED TO, READ BACK BY AND VERIFIED WITH: CHERYL SMITH AT 1610 09/19/15 DV    Report Status 09/21/2015 FINAL  Final   Organism ID, Bacteria ENTEROCOCCUS FAECALIS  Final      Susceptibility   Enterococcus faecalis - MIC*    AMPICILLIN <=2 SENSITIVE Sensitive     LINEZOLID 2 SENSITIVE Sensitive     CIPROFLOXACIN Value in next row Resistant      RESISTANT>=8    TETRACYCLINE Value in next row Resistant      RESISTANT>=16    VANCOMYCIN Value in next row Resistant      RESISTANT>=32    GENTAMICIN SYNERGY Value in next row Resistant      RESISTANT>=32    * ENTEROCOCCUS FAECALIS  Culture, respiratory (NON-Expectorated)     Status: None   Collection  Time: 09/16/15  3:50 PM  Result Value Ref Range Status   Specimen Description TRACHEAL ASPIRATE  Final   Special Requests Immunocompromised  Final   Gram Stain   Final    FEW WBC SEEN GOOD SPECIMEN - 80-90% WBCS RARE GRAM NEGATIVE RODS    Culture LIGHT GROWTH PSEUDOMONAS AERUGINOSA  Final  Report Status 09/19/2015 FINAL  Final   Organism ID, Bacteria PSEUDOMONAS AERUGINOSA  Final      Susceptibility   Pseudomonas aeruginosa - MIC*    CEFTAZIDIME 8 SENSITIVE Sensitive     CIPROFLOXACIN 2 INTERMEDIATE Intermediate     GENTAMICIN >=16 RESISTANT Resistant     IMIPENEM >=16 RESISTANT Resistant     PIP/TAZO Value in next row Sensitive      SENSITIVE32    CEFEPIME Value in next row Sensitive      SENSITIVE8    LEVOFLOXACIN Value in next row Resistant      RESISTANT>=8    * LIGHT GROWTH PSEUDOMONAS AERUGINOSA  Culture, expectorated sputum-assessment     Status: None   Collection Time: 09/22/15  2:09 PM  Result Value Ref Range Status   Specimen Description ENDOTRACHEAL  Final   Special Requests Normal  Final   Sputum evaluation THIS SPECIMEN IS ACCEPTABLE FOR SPUTUM CULTURE  Final   Report Status 09/24/2015 FINAL  Final  Culture, respiratory (NON-Expectorated)     Status: None   Collection Time: 09/22/15  2:09 PM  Result Value Ref Range Status   Specimen Description ENDOTRACHEAL  Final   Special Requests Normal Reflexed from [id number]  Final   Gram Stain   Final    FEW WBC SEEN FEW GRAM NEGATIVE RODS POOR SPECIMEN - LESS THAN 70% WBCS    Culture   Final    MODERATE GROWTH PSEUDOMONAS AERUGINOSA LIGHT GROWTH KLEBSIELLA PNEUMONIAE REFER TO SENSITIVITIES FROM PREVIOUS CULTURE FOR ORG 2    Report Status 10/01/2015 FINAL  Final   Organism ID, Bacteria PSEUDOMONAS AERUGINOSA  Final   Organism ID, Bacteria KLEBSIELLA PNEUMONIAE  Final      Susceptibility   Pseudomonas aeruginosa - MIC*    CEFTAZIDIME 4 SENSITIVE Sensitive     CIPROFLOXACIN 2 INTERMEDIATE Intermediate      GENTAMICIN >=16 RESISTANT Resistant     IMIPENEM >=16 RESISTANT Resistant     PIP/TAZO Value in next row Sensitive      SENSITIVE16    * MODERATE GROWTH PSEUDOMONAS AERUGINOSA  Tissue culture     Status: None   Collection Time: 09/24/15  6:44 AM  Result Value Ref Range Status   Specimen Description BONE  Final   Special Requests Normal  Final   Gram Stain MODERATE WBC SEEN FEW GRAM NEGATIVE RODS   Final   Culture   Final    MODERATE GROWTH ESCHERICHIA COLI MODERATE GROWTH KLEBSIELLA PNEUMONIAE ESBL-EXTENDED SPECTRUM BETA LACTAMASE-THE ORGANISM IS RESISTANT TO PENICILLINS, CEPHALOSPORINS AND AZTREONAM ACCORDING TO CLSI [id number] VOL.25 N01 JAN 2005. ORGANISM 1 This organism isolate is resistant to one or more antiotic agents in three or more antimicrobial categories.  Suggest Infectious Disease consult.   ORGANISM 2 CRITICAL RESULT CALLED TO, READ BACK BY AND VERIFIED WITH: RN Mardene CelesteJOANNA LINDSAY 09/27/15 1005AM    Report Status 09/28/2015 FINAL  Final   Organism ID, Bacteria ESCHERICHIA COLI  Final   Organism ID, Bacteria KLEBSIELLA PNEUMONIAE  Final      Susceptibility   Escherichia coli - MIC*    AMPICILLIN >=32 RESISTANT Resistant     CEFTAZIDIME 4 RESISTANT Resistant     CEFAZOLIN >=64 RESISTANT Resistant     CEFTRIAXONE 8 RESISTANT Resistant     GENTAMICIN <=1 SENSITIVE Sensitive     IMIPENEM 8 RESISTANT Resistant     TRIMETH/SULFA <=20 SENSITIVE Sensitive     Extended ESBL POSITIVE Resistant     PIP/TAZO Value in next row  Resistant      RESISTANT>=128    * MODERATE GROWTH ESCHERICHIA COLI   Klebsiella pneumoniae - MIC*    AMPICILLIN Value in next row Resistant      RESISTANT>=128    CEFTAZIDIME Value in next row Resistant      RESISTANT>=128    CEFAZOLIN Value in next row Resistant      RESISTANT>=128    CEFTRIAXONE Value in next row Resistant      RESISTANT>=128    CIPROFLOXACIN Value in next row Resistant      RESISTANT>=128    GENTAMICIN Value in next row Resistant       RESISTANT>=128    IMIPENEM Value in next row Resistant      RESISTANT>=128    TRIMETH/SULFA Value in next row Sensitive      RESISTANT>=128    PIP/TAZO Value in next row Resistant      RESISTANT>=128    * MODERATE GROWTH KLEBSIELLA PNEUMONIAE  Anaerobic culture     Status: None   Collection Time: 09/24/15  3:55 PM  Result Value Ref Range Status   Specimen Description BONE  Final   Special Requests Normal  Final   Culture NO ANAEROBES ISOLATED  Final   Report Status 09/29/2015 FINAL  Final  C difficile quick scan w PCR reflex     Status: None   Collection Time: 10/07/15  2:02 PM  Result Value Ref Range Status   C Diff antigen NEGATIVE NEGATIVE Final   C Diff toxin NEGATIVE NEGATIVE Final   C Diff interpretation Negative for C. difficile  Final    Coagulation Studies: No results for input(s): LABPROT, INR in the last 72 hours.  Urinalysis: No results for input(s): COLORURINE, LABSPEC, PHURINE, GLUCOSEU, HGBUR, BILIRUBINUR, KETONESUR, PROTEINUR, UROBILINOGEN, NITRITE, LEUKOCYTESUR in the last 72 hours.  Invalid input(s): APPERANCEUR    Imaging: No results found.   Medications:   . feeding supplement (VITAL AF 1.2 CAL) 1,000 mL (10/14/15 0523)  . norepinephrine Stopped (10/08/15 0530)   . [START ON 10/17/2015] amantadine  200 mg Per Tube Q7 days  . antiseptic oral rinse  7 mL Mouth Rinse QID  . aspirin  81 mg Oral Daily  . calcium acetate (Phos Binder)  667 mg Oral TID WC  . chlorhexidine gluconate  15 mL Mouth Rinse BID  . epoetin (EPOGEN/PROCRIT) injection  10,000 Units Intravenous Q M,W,F-HD  . escitalopram  10 mg Oral Daily  . feeding supplement (PRO-STAT SUGAR FREE 64)  30 mL Oral 6 X Daily  . folic acid  1 mg Oral Daily  . free water  20 mL Per Tube 6 times per day  . heparin subcutaneous  5,000 Units Subcutaneous Q12H  . hydrocortisone  10 mg Per Tube BID  . insulin aspart  0-9 Units Subcutaneous 6 times per day  . ipratropium-albuterol  3 mL Nebulization  QID  . lidocaine  1 patch Transdermal Q24H  . loperamide  2 mg Oral Daily  . midodrine  5 mg Per Tube TID WC  . multivitamin  5 mL Oral Daily  . pantoprazole sodium  40 mg Per Tube Daily  . sodium chloride  10-40 mL Intracatheter Q12H  . sulfamethoxazole-trimethoprim  2.5 tablet Per Tube q1800  . thiamine  100 mg Per Tube Daily   sodium chloride, sodium chloride, acetaminophen (TYLENOL) oral liquid 160 mg/5 mL, HYDROmorphone (DILAUDID) injection, morphine injection, ondansetron (ZOFRAN) IV, oxyCODONE, sodium chloride, zinc oxide  Assessment/ Plan:  49 y.o. 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 Admitted on 07/28/2015  1. End-stage renal disease on hemodialysis on HD MWF. The patient has been on dialysis since October of 2014. R IJ permcath. Dialysis treatment with IV albumin for oncotic support.  - Patient was to receive dialysis yesterday however secondary to nursing scheduling issue dialysis exit be performed this a.m. Orders have been prepared. Albumin to be used at the start of dialysis.   2. Anemia of CKD: Continue Epogen with dialysis..  3. Secondary hyperparathyroidism: PTH low at 55 (9/25) -Patient has not required Binder therapy. Continue to follow phosphorus.   4. Sepsis: VRE in blood. Bone cultures E. Coli and klebsiella - Treatment as per ID and ICU team: daptomycin completed on 11/10 and now on  trimethoprim/sulfamethoxazole.   5. Generalized Dependent edema/Anasarca due to 3rd spacing -  As before we have had difficulty performing ultrafiltration.  We continue to use albumin, midodrine, low BFRs to help achieve UF.  6. Acute resp failure - on the vent with 28% fio2 at the moment.   LOS: 64 Jalasia Eskridge 11/26/20169:07 AM

## 2015-10-16 ENCOUNTER — Inpatient Hospital Stay: Payer: 59

## 2015-10-16 LAB — GLUCOSE, CAPILLARY
GLUCOSE-CAPILLARY: 108 mg/dL — AB (ref 65–99)
GLUCOSE-CAPILLARY: 141 mg/dL — AB (ref 65–99)
GLUCOSE-CAPILLARY: 97 mg/dL (ref 65–99)
Glucose-Capillary: 130 mg/dL — ABNORMAL HIGH (ref 65–99)
Glucose-Capillary: 153 mg/dL — ABNORMAL HIGH (ref 65–99)
Glucose-Capillary: 147 mg/dL — ABNORMAL HIGH (ref 65–99)
Glucose-Capillary: 117 mg/dL — ABNORMAL HIGH (ref 65–99)

## 2015-10-16 LAB — CBC WITH DIFFERENTIAL/PLATELET
Basophils Absolute: 0 10*3/uL (ref 0–0.1)
Basophils Relative: 0 %
Eosinophils Absolute: 0.1 10*3/uL (ref 0–0.7)
Eosinophils Relative: 1 %
HEMATOCRIT: 24.6 % — AB (ref 35.0–47.0)
HEMOGLOBIN: 7.4 g/dL — AB (ref 12.0–16.0)
LYMPHS ABS: 0.7 10*3/uL — AB (ref 1.0–3.6)
LYMPHS PCT: 6 %
MCH: 30.4 pg (ref 26.0–34.0)
MCHC: 30 g/dL — AB (ref 32.0–36.0)
MCV: 101.1 fL — AB (ref 80.0–100.0)
MONOS PCT: 7 %
Monocytes Absolute: 0.8 10*3/uL (ref 0.2–0.9)
NEUTROS ABS: 9.9 10*3/uL — AB (ref 1.4–6.5)
NEUTROS PCT: 86 %
Platelets: 169 10*3/uL (ref 150–440)
RBC: 2.43 MIL/uL — ABNORMAL LOW (ref 3.80–5.20)
RDW: 17.1 % — ABNORMAL HIGH (ref 11.5–14.5)
WBC: 11.6 10*3/uL — ABNORMAL HIGH (ref 3.6–11.0)

## 2015-10-16 LAB — BASIC METABOLIC PANEL
Anion gap: 10 (ref 5–15)
BUN: 66 mg/dL — AB (ref 6–20)
CHLORIDE: 102 mmol/L (ref 101–111)
CO2: 32 mmol/L (ref 22–32)
CREATININE: 1.22 mg/dL — AB (ref 0.44–1.00)
Calcium: 8.7 mg/dL — ABNORMAL LOW (ref 8.9–10.3)
GFR calc non Af Amer: 51 mL/min — ABNORMAL LOW (ref >60)
GFR, EST AFRICAN AMERICAN: 59 mL/min — AB (ref >60)
Glucose, Bld: 166 mg/dL — ABNORMAL HIGH (ref 65–99)
Potassium: 4.2 mmol/L (ref 3.5–5.1)
Sodium: 144 mmol/L (ref 135–145)

## 2015-10-16 LAB — PHOSPHORUS: Phosphorus: 2.2 mg/dL — ABNORMAL LOW (ref 2.5–4.6)

## 2015-10-16 MED ORDER — K PHOS MONO-SOD PHOS DI & MONO 155-852-130 MG PO TABS
250.0000 mg | ORAL_TABLET | Freq: Once | ORAL | Status: AC
  Administered 2015-10-16: 250 mg via ORAL
  Filled 2015-10-16: qty 1

## 2015-10-16 NOTE — Progress Notes (Signed)
Subjective:  Patient febrile this a.m. Also tachypnea noted. Patient had dialysis yesterday. Husband at bedside. Chest x-ray performed this a.m.    Objective:  Vital signs in last 24 hours:  Temp:  [97.9 F (36.6 C)-102.6 F (39.2 C)] 102.6 F (39.2 C) (11/27 0800) Pulse Rate:  [104-121] 116 (11/27 0800) Resp:  [16-37] 37 (11/27 0800) BP: (105-133)/(50-101) 118/69 mmHg (11/27 0800) SpO2:  [88 %-97 %] 89 % (11/27 0800) FiO2 (%):  [28 %] 28 % (11/27 0400)  Weight change:  Filed Weights    Intake/Output: I/O last 3 completed shifts: In: 1210 [I.V.:20; NG/GT:1190] Out: 951 [Emesis/NG output:1; Other:950]   Intake/Output this shift:     Physical Exam: General: No acute distress, chronically ill appearing  Head: NG in place, OM moist  Eyes: anicteric  Neck: Tracheostomy in place  Lungs:  Scattered rhonchi, tachypneic  Heart: S1S2 +tachycardia  Abdomen:  Soft, nontender  Extremities: +++ dependent edema, anasarca  Neurologic: Awake this AM.  Access:  R IJ permcath.       Basic Metabolic Panel:  Recent Labs Lab 10/10/15 0613 10/12/15 0636 10/14/15 0514 10/15/15 1223 10/16/15 0625  NA 141 142 145  --  144  K 5.2* 4.7 4.2  --  4.2  CL 99* 101 105  --  102  CO2 31 32 32  --  32  GLUCOSE 81 136* 109*  --  166*  BUN 100* 84* 74*  --  66*  CREATININE 1.63* 1.42* 1.20*  --  1.22*  CALCIUM 8.5* 8.5* 8.5*  --  8.7*  PHOS  --  5.1*  --  1.9* 2.2*    Liver Function Tests:  Recent Labs Lab 10/12/15 0636  ALBUMIN 2.2*   No results for input(s): LIPASE, AMYLASE in the last 168 hours. No results for input(s): AMMONIA in the last 168 hours.  CBC:  Recent Labs Lab 10/10/15 0613 10/12/15 0636 10/14/15 0514 10/16/15 0625  WBC 7.7 8.7 12.3* 11.6*  NEUTROABS  --   --  10.9* 9.9*  HGB 8.4* 8.0* 7.5* 7.4*  HCT 26.6* 25.9* 25.0* 24.6*  MCV 101.3* 102.5* 101.6* 101.1*  PLT 195 190 183 169    Cardiac Enzymes: No results for input(s): CKTOTAL, CKMB,  CKMBINDEX, TROPONINI in the last 168 hours.  BNP: Invalid input(s): POCBNP  CBG:  Recent Labs Lab 10/15/15 1630 10/15/15 1945 10/16/15 0103 10/16/15 0359 10/16/15 0725  GLUCAP 164* 126* 108* 130* 153*    Microbiology: Results for orders placed or performed during the hospital encounter of 09/10/2015  Blood Culture (routine x 2)     Status: None   Collection Time: Sep 10, 2015  8:51 AM  Result Value Ref Range Status   Specimen Description BLOOD [name]  Final   Special Requests BOTTLES DRAWN AEROBIC AND ANAEROBIC  3CC  Final   Culture NO GROWTH 5 DAYS  Final   Report Status 08/17/2015 FINAL  Final  Blood Culture (routine x 2)     Status: None   Collection Time: 09-10-2015  9:20 AM  Result Value Ref Range Status   Specimen Description BLOOD LEFT ARM  Final   Special Requests BOTTLES DRAWN AEROBIC AND ANAEROBIC  1CC  Final   Culture NO GROWTH 5 DAYS  Final   Report Status 08/17/2015 FINAL  Final  Wound culture     Status: None   Collection Time: 10-Sep-2015  9:20 AM  Result Value Ref Range Status   Specimen Description DECUBITIS  Final   Special Requests Normal  Final   Gram Stain   Final    FEW WBC SEEN MANY GRAM NEGATIVE RODS RARE GRAM POSITIVE COCCI    Culture   Final    HEAVY GROWTH ESCHERICHIA COLI MODERATE GROWTH ENTEROBACTER AEROGENES PROTEUS MIRABILIS HEAVY GROWTH ENTEROCOCCUS SPECIES VRE HAVE INTRINSIC RESISTANCE TO MOST COMMONLY USED ANTIBIOTICS AND THE ABILITY TO ACQUIRE RESISTANCE TO MOST AVAILABLE ANTIBIOTICS.    Report Status 08/16/2015 FINAL  Final   Organism ID, Bacteria ESCHERICHIA COLI  Final   Organism ID, Bacteria ENTEROBACTER AEROGENES  Final   Organism ID, Bacteria PROTEUS MIRABILIS  Final   Organism ID, Bacteria ENTEROCOCCUS SPECIES  Final      Susceptibility   Enterobacter aerogenes - MIC*    CEFTAZIDIME <=1 SENSITIVE Sensitive     CEFAZOLIN >=64 RESISTANT Resistant     CEFTRIAXONE <=1 SENSITIVE Sensitive     CIPROFLOXACIN <=0.25 SENSITIVE Sensitive      GENTAMICIN <=1 SENSITIVE Sensitive     IMIPENEM 1 SENSITIVE Sensitive     TRIMETH/SULFA <=20 SENSITIVE Sensitive     * MODERATE GROWTH ENTEROBACTER AEROGENES   Escherichia coli - MIC*    AMPICILLIN <=2 SENSITIVE Sensitive     CEFTAZIDIME <=1 SENSITIVE Sensitive     CEFAZOLIN <=4 SENSITIVE Sensitive     CEFTRIAXONE <=1 SENSITIVE Sensitive     CIPROFLOXACIN <=0.25 SENSITIVE Sensitive     GENTAMICIN <=1 SENSITIVE Sensitive     IMIPENEM <=0.25 SENSITIVE Sensitive     TRIMETH/SULFA <=20 SENSITIVE Sensitive     * HEAVY GROWTH ESCHERICHIA COLI   Proteus mirabilis - MIC*    AMPICILLIN >=32 RESISTANT Resistant     CEFTAZIDIME <=1 SENSITIVE Sensitive     CEFAZOLIN 8 SENSITIVE Sensitive     CEFTRIAXONE <=1 SENSITIVE Sensitive     CIPROFLOXACIN <=0.25 SENSITIVE Sensitive     GENTAMICIN <=1 SENSITIVE Sensitive     IMIPENEM 1 SENSITIVE Sensitive     TRIMETH/SULFA <=20 SENSITIVE Sensitive     * PROTEUS MIRABILIS   Enterococcus species - MIC*    AMPICILLIN >=32 RESISTANT Resistant     VANCOMYCIN >=32 RESISTANT Resistant     GENTAMICIN SYNERGY SENSITIVE Sensitive     TETRACYCLINE Value in next row Resistant      RESISTANT>=16    * HEAVY GROWTH ENTEROCOCCUS SPECIES  MRSA PCR Screening     Status: None   Collection Time: 07/21/2015  2:38 PM  Result Value Ref Range Status   MRSA by PCR NEGATIVE NEGATIVE Final    Comment:        The GeneXpert MRSA Assay (FDA approved for NASAL specimens only), is one component of a comprehensive MRSA colonization surveillance program. It is not intended to diagnose MRSA infection nor to guide or monitor treatment for MRSA infections.   Blood culture (single)     Status: None   Collection Time: 08/11/2015  3:36 PM  Result Value Ref Range Status   Specimen Description BLOOD RIGHT ASSIST CONTROL  Final   Special Requests BOTTLES DRAWN AEROBIC AND ANAEROBIC  Final   Culture NO GROWTH 5 DAYS  Final   Report Status 08/17/2015 FINAL  Final  Wound culture      Status: None   Collection Time: 08/13/15 12:37 PM  Result Value Ref Range Status   Specimen Description WOUND  Final   Special Requests Normal  Final   Gram Stain   Final    FEW WBC SEEN TOO NUMEROUS TO COUNT GRAM NEGATIVE RODS FEW GRAM POSITIVE COCCI  Culture   Final    HEAVY GROWTH ESCHERICHIA COLI MODERATE GROWTH PROTEUS MIRABILIS LIGHT GROWTH KLEBSIELLA PNEUMONIAE MODERATE GROWTH ENTEROCOCCUS GALLINARUM CRITICAL RESULT CALLED TO, READ BACK BY AND VERIFIED WITH: [facility] PsANDRA BORBA AT 1042 08/16/15 DV    Report Status 08/17/2015 FINAL  Final   Organism ID, Bacteria ESCHERICHIA COLI  Final   Organism ID, Bacteria PROTEUS MIRABILIS  Final   Organism ID, Bacteria KLEBSIELLA PNEUMONIAE  Final   Organism ID, Bacteria ENTEROCOCCUS GALLINARUM  Final      Susceptibility   Escherichia coli - MIC*    AMPICILLIN >=32 RESISTANT Resistant     CEFTAZIDIME 4 RESISTANT Resistant     CEFAZOLIN >=64 RESISTANT Resistant     CEFTRIAXONE 16 RESISTANT Resistant     GENTAMICIN 2 SENSITIVE Sensitive     IMIPENEM >=16 RESISTANT Resistant     TRIMETH/SULFA <=20 SENSITIVE Sensitive     Extended ESBL POSITIVE Resistant     PIP/TAZO Value in next row Resistant      RESISTANT>=128    CIPROFLOXACIN Value in next row Sensitive      SENSITIVE<=0.25    * HEAVY GROWTH ESCHERICHIA COLI   Klebsiella pneumoniae - MIC*    AMPICILLIN Value in next row Resistant      SENSITIVE<=0.25    CEFTAZIDIME Value in next row Resistant      SENSITIVE<=0.25    CEFAZOLIN Value in next row Resistant      SENSITIVE<=0.25    CEFTRIAXONE Value in next row Resistant      SENSITIVE<=0.25    CIPROFLOXACIN Value in next row Resistant      SENSITIVE<=0.25    GENTAMICIN Value in next row Sensitive      SENSITIVE<=0.25    IMIPENEM Value in next row Resistant      SENSITIVE<=0.25    TRIMETH/SULFA Value in next row Resistant      SENSITIVE<=0.25    PIP/TAZO Value in next row Resistant      RESISTANT>=128    * LIGHT GROWTH  KLEBSIELLA PNEUMONIAE   Proteus mirabilis - MIC*    AMPICILLIN Value in next row Resistant      RESISTANT>=128    CEFTAZIDIME Value in next row Sensitive      RESISTANT>=128    CEFAZOLIN Value in next row Sensitive      RESISTANT>=128    CEFTRIAXONE Value in next row Sensitive      RESISTANT>=128    CIPROFLOXACIN Value in next row Sensitive      RESISTANT>=128    GENTAMICIN Value in next row Sensitive      RESISTANT>=128    IMIPENEM Value in next row Sensitive      RESISTANT>=128    TRIMETH/SULFA Value in next row Sensitive      RESISTANT>=128    PIP/TAZO Value in next row Sensitive      SENSITIVE<=4    * MODERATE GROWTH PROTEUS MIRABILIS   Enterococcus gallinarum - MIC*    AMPICILLIN Value in next row Resistant      SENSITIVE<=4    GENTAMICIN SYNERGY Value in next row Sensitive      SENSITIVE<=4    CIPROFLOXACIN Value in next row Resistant      RESISTANT>=8    TETRACYCLINE Value in next row Resistant      RESISTANT>=16    * MODERATE GROWTH ENTEROCOCCUS GALLINARUM  Wound culture     Status: None   Collection Time: 08/15/15  2:53 PM  Result Value Ref Range Status   Specimen Description WOUND  Final   Special Requests NONE  Final   Gram Stain FEW WBC SEEN NO ORGANISMS SEEN   Final   Culture NO GROWTH 3 DAYS  Final   Report Status 08/18/2015 FINAL  Final  C difficile quick scan w PCR reflex     Status: None   Collection Time: 08/17/15 11:34 AM  Result Value Ref Range Status   C Diff antigen NEGATIVE NEGATIVE Final   C Diff toxin NEGATIVE NEGATIVE Final   C Diff interpretation Negative for C. difficile  Final  Stool culture     Status: None   Collection Time: 09/03/15  3:51 PM  Result Value Ref Range Status   Specimen Description STOOL  Final   Special Requests Immunocompromised  Final   Culture   Final    NO SALMONELLA OR SHIGELLA ISOLATED No Pathogenic E. coli detected NO CAMPYLOBACTER DETECTED    Report Status 09/07/2015 FINAL  Final  C difficile quick scan w  PCR reflex     Status: None   Collection Time: 09/13/15 12:51 AM  Result Value Ref Range Status   C Diff antigen NEGATIVE NEGATIVE Final   C Diff toxin NEGATIVE NEGATIVE Final   C Diff interpretation Negative for C. difficile  Final  Culture, blood (routine x 2)     Status: None   Collection Time: 09/16/15 11:24 AM  Result Value Ref Range Status   Specimen Description BLOOD LEFT HAND  Final   Special Requests BOTTLES DRAWN AEROBIC AND ANAEROBIC  1CC  Final   Culture NO GROWTH 5 DAYS  Final   Report Status 09/21/2015 FINAL  Final  Culture, blood (routine x 2)     Status: None   Collection Time: 09/16/15 12:23 PM  Result Value Ref Range Status   Specimen Description BLOOD RIGHT HAND  Final   Special Requests BOTTLES DRAWN AEROBIC AND ANAEROBIC  1CC  Final   Culture  Setup Time   Final    GRAM POSITIVE COCCI AEROBIC BOTTLE ONLY CRITICAL RESULT CALLED TO, READ BACK BY AND VERIFIED WITH: TESS THOMAS,RN 09/17/2015 0631 BY JRS.    Culture   Final    ENTEROCOCCUS FAECALIS AEROBIC BOTTLE ONLY VRE HAVE INTRINSIC RESISTANCE TO MOST COMMONLY USED ANTIBIOTICS AND THE ABILITY TO ACQUIRE RESISTANCE TO MOST AVAILABLE ANTIBIOTICS. CRITICAL RESULT CALLED TO, READ BACK BY AND VERIFIED WITH: CHERYL SMITH AT [id number] 09/19/15 DV    Report Status 09/21/2015 FINAL  Final   Organism ID, Bacteria ENTEROCOCCUS FAECALIS  Final      Susceptibility   Enterococcus faecalis - MIC*    AMPICILLIN <=2 SENSITIVE Sensitive     LINEZOLID 2 SENSITIVE Sensitive     CIPROFLOXACIN Value in next row Resistant      RESISTANT>=8    TETRACYCLINE Value in next row Resistant      RESISTANT>=16    VANCOMYCIN Value in next row Resistant      RESISTANT>=32    GENTAMICIN SYNERGY Value in next row Resistant      RESISTANT>=32    * ENTEROCOCCUS FAECALIS  Culture, respiratory (NON-Expectorated)     Status: None   Collection Time: 09/16/15  3:50 PM  Result Value Ref Range Status   Specimen Description TRACHEAL ASPIRATE  Final    Special Requests Immunocompromised  Final   Gram Stain   Final    FEW WBC SEEN GOOD SPECIMEN - 80-90% WBCS RARE GRAM NEGATIVE RODS    Culture LIGHT GROWTH PSEUDOMONAS AERUGINOSA  Final   Report Status 09/19/2015  FINAL  Final   Organism ID, Bacteria PSEUDOMONAS AERUGINOSA  Final      Susceptibility   Pseudomonas aeruginosa - MIC*    CEFTAZIDIME 8 SENSITIVE Sensitive     CIPROFLOXACIN 2 INTERMEDIATE Intermediate     GENTAMICIN >=16 RESISTANT Resistant     IMIPENEM >=16 RESISTANT Resistant     PIP/TAZO Value in next row Sensitive      SENSITIVE32    CEFEPIME Value in next row Sensitive      SENSITIVE8    LEVOFLOXACIN Value in next row Resistant      RESISTANT>=8    * LIGHT GROWTH PSEUDOMONAS AERUGINOSA  Culture, expectorated sputum-assessment     Status: None   Collection Time: 09/22/15  2:09 PM  Result Value Ref Range Status   Specimen Description ENDOTRACHEAL  Final   Special Requests Normal  Final   Sputum evaluation THIS SPECIMEN IS ACCEPTABLE FOR SPUTUM CULTURE  Final   Report Status 09/24/2015 FINAL  Final  Culture, respiratory (NON-Expectorated)     Status: None   Collection Time: 09/22/15  2:09 PM  Result Value Ref Range Status   Specimen Description ENDOTRACHEAL  Final   Special Requests Normal Reflexed from [id number]  Final   Gram Stain   Final    FEW WBC SEEN FEW GRAM NEGATIVE RODS POOR SPECIMEN - LESS THAN 70% WBCS    Culture   Final    MODERATE GROWTH PSEUDOMONAS AERUGINOSA LIGHT GROWTH KLEBSIELLA PNEUMONIAE REFER TO SENSITIVITIES FROM PREVIOUS CULTURE FOR ORG 2    Report Status 10/01/2015 FINAL  Final   Organism ID, Bacteria PSEUDOMONAS AERUGINOSA  Final   Organism ID, Bacteria KLEBSIELLA PNEUMONIAE  Final      Susceptibility   Pseudomonas aeruginosa - MIC*    CEFTAZIDIME 4 SENSITIVE Sensitive     CIPROFLOXACIN 2 INTERMEDIATE Intermediate     GENTAMICIN >=16 RESISTANT Resistant     IMIPENEM >=16 RESISTANT Resistant     PIP/TAZO Value in next row  Sensitive      SENSITIVE16    * MODERATE GROWTH PSEUDOMONAS AERUGINOSA  Tissue culture     Status: None   Collection Time: 09/24/15  6:44 AM  Result Value Ref Range Status   Specimen Description BONE  Final   Special Requests Normal  Final   Gram Stain MODERATE WBC SEEN FEW GRAM NEGATIVE RODS   Final   Culture   Final    MODERATE GROWTH ESCHERICHIA COLI MODERATE GROWTH KLEBSIELLA PNEUMONIAE ESBL-EXTENDED SPECTRUM BETA LACTAMASE-THE ORGANISM IS RESISTANT TO PENICILLINS, CEPHALOSPORINS AND AZTREONAM ACCORDING TO CLSI [id number] VOL.25 N01 JAN 2005. ORGANISM 1 This organism isolate is resistant to one or more antiotic agents in three or more antimicrobial categories.  Suggest Infectious Disease consult.   ORGANISM 2 CRITICAL RESULT CALLED TO, READ BACK BY AND VERIFIED WITH: RN Mardene Celeste LINDSAY 09/27/15 1005AM    Report Status 09/28/2015 FINAL  Final   Organism ID, Bacteria ESCHERICHIA COLI  Final   Organism ID, Bacteria KLEBSIELLA PNEUMONIAE  Final      Susceptibility   Escherichia coli - MIC*    AMPICILLIN >=32 RESISTANT Resistant     CEFTAZIDIME 4 RESISTANT Resistant     CEFAZOLIN >=64 RESISTANT Resistant     CEFTRIAXONE 8 RESISTANT Resistant     GENTAMICIN <=1 SENSITIVE Sensitive     IMIPENEM 8 RESISTANT Resistant     TRIMETH/SULFA <=20 SENSITIVE Sensitive     Extended ESBL POSITIVE Resistant     PIP/TAZO Value in next row Resistant  RESISTANT>=128    * MODERATE GROWTH ESCHERICHIA COLI   Klebsiella pneumoniae - MIC*    AMPICILLIN Value in next row Resistant      RESISTANT>=128    CEFTAZIDIME Value in next row Resistant      RESISTANT>=128    CEFAZOLIN Value in next row Resistant      RESISTANT>=128    CEFTRIAXONE Value in next row Resistant      RESISTANT>=128    CIPROFLOXACIN Value in next row Resistant      RESISTANT>=128    GENTAMICIN Value in next row Resistant      RESISTANT>=128    IMIPENEM Value in next row Resistant      RESISTANT>=128    TRIMETH/SULFA Value in  next row Sensitive      RESISTANT>=128    PIP/TAZO Value in next row Resistant      RESISTANT>=128    * MODERATE GROWTH KLEBSIELLA PNEUMONIAE  Anaerobic culture     Status: None   Collection Time: 09/24/15  3:55 PM  Result Value Ref Range Status   Specimen Description BONE  Final   Special Requests Normal  Final   Culture NO ANAEROBES ISOLATED  Final   Report Status 09/29/2015 FINAL  Final  C difficile quick scan w PCR reflex     Status: None   Collection Time: 10/07/15  2:02 PM  Result Value Ref Range Status   C Diff antigen NEGATIVE NEGATIVE Final   C Diff toxin NEGATIVE NEGATIVE Final   C Diff interpretation Negative for C. difficile  Final    Coagulation Studies: No results for input(s): LABPROT, INR in the last 72 hours.  Urinalysis: No results for input(s): COLORURINE, LABSPEC, PHURINE, GLUCOSEU, HGBUR, BILIRUBINUR, KETONESUR, PROTEINUR, UROBILINOGEN, NITRITE, LEUKOCYTESUR in the last 72 hours.  Invalid input(s): APPERANCEUR    Imaging: No results found.   Medications:   . feeding supplement (VITAL AF 1.2 CAL) 1,000 mL (10/15/15 1803)  . norepinephrine Stopped (10/08/15 0530)   . [START ON 10/17/2015] amantadine  200 mg Per Tube Q7 days  . antiseptic oral rinse  7 mL Mouth Rinse QID  . aspirin  81 mg Oral Daily  . calcium acetate (Phos Binder)  667 mg Oral TID WC  . chlorhexidine gluconate  15 mL Mouth Rinse BID  . epoetin (EPOGEN/PROCRIT) injection  10,000 Units Intravenous Q M,W,F-HD  . escitalopram  10 mg Oral Daily  . feeding supplement (PRO-STAT SUGAR FREE 64)  30 mL Oral 6 X Daily  . folic acid  1 mg Oral Daily  . free water  20 mL Per Tube 6 times per day  . heparin subcutaneous  5,000 Units Subcutaneous Q12H  . hydrocortisone  10 mg Per Tube BID  . insulin aspart  0-9 Units Subcutaneous 6 times per day  . ipratropium-albuterol  3 mL Nebulization QID  . lidocaine  1 patch Transdermal Q24H  . loperamide  2 mg Oral Daily  . midodrine  5 mg Per Tube TID  WC  . multivitamin  5 mL Oral Daily  . pantoprazole sodium  40 mg Per Tube Daily  . sodium chloride  10-40 mL Intracatheter Q12H  . sulfamethoxazole-trimethoprim  2.5 tablet Per Tube q1800  . thiamine  100 mg Per Tube Daily   sodium chloride, sodium chloride, acetaminophen (TYLENOL) oral liquid 160 mg/5 mL, HYDROmorphone (DILAUDID) injection, morphine injection, ondansetron (ZOFRAN) IV, oxyCODONE, sodium chloride, zinc oxide  Assessment/ Plan:  49 y.o. 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 Admitted on Aug 20, 2015  1. End-stage renal disease on hemodialysis on HD MWF. The patient has been on dialysis since October of 2014. R IJ permcath. Dialysis treatment with IV albumin for oncotic support.  - Patient completed dialysis yesterday.  Tolerated well. Next dialysis tomorrow.   2. Anemia of CKD: Hemoglobin 7.4 at last check. Continue Epogen with dialysis.  3. Secondary hyperparathyroidism: PTH low at 55 (9/25) -Phosphorous actually remains low at 2.2. No indication for binders.   4. Sepsis: VRE in blood. Bone cultures E. Coli and klebsiella - Treatment as per ID and ICU team: daptomycin completed on 11/10 and now on trimethoprim/sulfamethoxazole. Patient now spiking fevers again. Obtain blood cultures 2 sets today.  5. Generalized Dependent edema/Anasarca due to 3rd spacing -  Tolerated dialysis well yesterday.  Continued use albumin and midodrine for blood pressure support.  6. Acute resp failure - Patient with tachypnea this a.m. Follow-up chest x-ray results.   LOS: 65 Mattelyn Imhoff 11/27/20168:24 AM

## 2015-10-16 NOTE — Progress Notes (Signed)
[facility] [place] Critical Care Medicine Progess Note  Name: Courtney [name] MRN: [id number] DOB: 1966/03/25    ADMISSION DATE:  August 21, 2015     INITIAL PRESENTATION:  49 AAF who has been in medical facilities (hosp, LTAC, rehab) for 2 yrs following gastric bypass surgery with multiple complications. Now with chronic trach, ESRD, profound debilitation, severe sacral pressure ulcer. Was seemingly making progress and transferred to rehab facility approx one week prior to this admission. She was sent to [facility] ED with AMS and hypotension. Working dx of severe sepsis/septic shock due to infected sacral pressure ulcer. Since admission. Her course is been very complicated with numerous complications including septic shock and GI bleeding.    INTERVAL HISTORY:  The patient is off the ventilator this morning, she is back on trach collar. She is awake and alert and has no particular complaints. The patient's husband is in the room. He was updated on the patient's care. Our notes, the patient continued to have diarrhea overnight, patient has concerns about the patient's diarrhea and his tube feeds. When necessary asked me about potentially placing a rectal tube.    Subjective: 11/24 >> awake and following commands, tolerating PRVC, will attempt PSV mode and cont with current medical plan. AM labs.   11/25>>awake following command, plan for PSV mode, HD today. Husband updated.   11/26>>awake and following commands, on FiO2 28%, no HD yesterday due to holiday schedule. Plan for 1L removal during HD today.  Mild temp elevation overnight, required tylenol, mild inc in WBC.  11/27>>husband in room, inc wbc, fevers overnight, s/p HD yesterday. No TCT overnight. Minimal vent changes made overnight. If and when patient makes it back to trach collar, Husband inquired about intermittent MV , to "exercise and keep lung open."  MAJOR EVENTS/TEST RESULTS: Admission 02/07/14-05/07/14 Admission 07/21/14-09/06/14  Discharged to [facility]. Pt had palliative consult at that time, were asked to sign off by husband.  09/23 CT head: NAD 09/23 EEG: no epileptiform activity 09/23 PRBCs for Hgb 6.4 09/24 bedside debridement of sacral wound. Abscess drained 09/25 Off vasopressors. More alert. No distress. Worsening thrombocytopenia. Vanc DC'd 09/29  Dr. Sampson Goon (I.D) excused from the case by patient's husband. 10/02 MRI -multiple infarcts 10/03 tracheal bleeding- transfused platelets 10/03 hospitalist service excused from the case by patient's husband 10/03 Echocardiogram ejection fraction was 55-60%, pulmonary systolic pressure was 39 mmHg 40/98 restart TF's at lower rate, attempt reg HD  10/12 Transferred to med-surg floor. Remains on PCCM service 10/14 SLP eval: pt unable to tolerate PMV adequately 10/17-will re-attempt PM valve-discussed with Speech therapist 10/18 passed swallow eval-start pureed thick foods no thin liquids-continue NG feeds 10/19 transferred to step down for sepsis/aspiration pneumonia 10/19 cxr shows RLL opacity 10/21 sacral decub debride at bedside by surgery 10/22 started back on vasopressors while on HD 10/27 CT with osteo, R hip fx, unable to identify tip of dubhoff tube - sent for fluoro study 10/28 Ortho consultation: I do not feel that she is a surgical candidate. Therefore, I feel that it would best to manage this fracture nonsurgically and allow it to heal by itself over time, which it should.  10/28 Gen Surg consultation: Due to the lack of free air or free flow of contrast and the peritoneum there is no indication for any surgical intervention on this. Would recommend pulling back the feeding tube 1-2 cm. Would recheck an abdominal film to confirm no pneumoperitoneum in the morning. Absence of any changes okay to continue using Dobbhoff for feeding  and medications. 10/28 gastrograffin study: The study confirms that the feeding tube pes perforated through the duodenum and the  tip is within a cavity that fills with injected contrast. The cavity does appear walled off 11/4 refuses oral feeds, continue TF's CT reviewed: T8-T9 discitis/osteomyelitis, RT HIP FRACTURE 11/5 placed back on vasopressors, placed back on Vent due to resp acidosis, levophed turned off 11/6 afternoon 11/5 PM Trach changed out due to cuff leak, #6 Shiley cuffed, 11/6 dubhoff occluded, removed and replaced, new dubhoff shows tube in the antral stomach 11/15 refusing to take oral feeds 11/18 placed back on Vent for increased WOB,SOB  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 Pneumoniae, P. Mirabilis, VRE Wound 9/26 >> No growth Blood 9/23 >> NEG CDiff 9/27>>neg Stool Cx 10/15>> negative Cdiff 10/25>>neg Trach Aspirate 10/28>> light growth pseudomonas Blood 10/28 >> 1/2 GPC >>  Sputum cultures obtained 09/22/15 due to mucus plugging>>Pseudomonas BONE TISSUE Cx 11/3>>E.coli (ESBL), k. Pneumonia (daptomycin and septra) Bld Cx 11/27>> Trach Asp 11/27>>  ANTIMICROBIALS:  Aztreonam 9/23 >> 9/24 Vanc 9/23 >> 9/25 Vanc 9/26>>9/27 Daptomycin 9/27>> 10/12, 10/28>>  Meropenem 9/23 >> 10/14, 10/28 >> 10/31 Septra 11/8>> Zosyn 11/27>>  VITAL SIGNS: Temp:  [97.9 F (36.6 C)-102.6 F (39.2 C)] 102.6 F (39.2 C) (11/27 0800) Pulse Rate:  [104-121] 116 (11/27 0800) Resp:  [16-37] 37 (11/27 0800) BP: (105-133)/(50-101) 118/69 mmHg (11/27 0800) SpO2:  [88 %-97 %] 94 % (11/27 0821) FiO2 (%):  [28 %-35 %] 35 % (11/27 0821) HEMODYNAMICS:   VENTILATOR SETTINGS: Vent Mode:  [-] PRVC FiO2 (%):  [28 %-35 %] 35 % Set Rate:  [14 bmp] 14 bmp Vt  Set:  [500 mL] 500 mL PEEP:  [5 cmH20] 5 cmH20 Plateau Pressure:  [21 cmH20] 21 cmH20 INTAKE / OUTPUT:  Intake/Output Summary (Last 24 hours) at 10/16/15 1003 Last data filed at 10/15/15 2201  Gross per 24 hour  Intake    795 ml  Output    951 ml  Net   -156 ml    PHYSICAL EXAMINATION: Physical Examination:   VS: BP 118/69 mmHg  Pulse 116  Temp(Src) 102.6 F (39.2 C) (Axillary)  Resp 37  Ht 5\' 9"  (1.753 m)  Wt 213 lb 13.5 oz (97 kg)  BMI 31.57 kg/m2  SpO2 94%  LMP  (LMP Unknown)  General Appearance: No resp distress, coarse upper airway sounds, off vent, on TCT Neuro: profoundly diffusely weak, alert today, nodding head HEENT: cushingoid facies, PERRLA, EOM intact Neck: trach site clean Pulmonary: clear anteriorly Cardiovascular: reg, + syst murmur Abdomen: soft, NT, +BS Extremities: warm, R>L UE edema Examination of the sacral decubitus ulcer- showed a very deep ulcer approximately 7-8 cm across.   stage IV ulcer.   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 Diff Stool incontinence - husband has refused to allow rectal tube placement Acute on chronic anemia Thrombocytopenia, resolved - HIT panel negative 9/25 Recurrent Severe sepsis, due to sacral pressure ulcer ID following  DM 2, controlled.  Chronic steroid therapy, for a history of of adrenal insufficiency Incidental finding of R hip fx - conservative mgmt. Waxing and waning encephalopathy - she was evaluated by both the psychiatry and neurology services, and it was deemed that the patient lacks decisional capacity at this time. 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 Husband declined TEE Profound deconditioning-criticall illness neuropathy Chronic pain  Cont vent support - settings reviewed and/or adjusted Wean in PSV <> ATC as tolerated Cont vent bundle Daily SBT if/when meets criteria Monitor BMET intermittently Monitor I/Os Correct electrolytes as indicated Intermittent HD per Renal service Cont TFs as tolerated Cont SUP Cont DVT px Cont TMP-SMX per ID service DVT px: SQ heparin Monitor CBC intermittently Transfuse per usual ICU guidelines Cont analgesics PRN Cont to work on LTACH placement CXR with new LLL infiltrate, now with fever - cont with bactrim, start zosyn, obtain bld and trach cx  Husband updated in the room   I have personally obtained a history, examined the patient, evaluated laboratory and imaging results, formulated the assessment and plan and placed orders. CRITICAL CARE: The patient is critically ill with multiple organ systems failure and requires high complexity decision making for assessment and support, frequent evaluation and titration of therapies, application of advanced monitoring technologies and extensive interpretation of multiple databases.   Critical Care Time devoted to patient care services described in this note is 40 minutes.    Stephanie Acre, MD [facility] Pager [phone] (please enter 7-digits) On Call Pager - [phone] (please enter 7-digits)

## 2015-10-17 LAB — RENAL FUNCTION PANEL
ANION GAP: 10 (ref 5–15)
Albumin: 2.2 g/dL — ABNORMAL LOW (ref 3.5–5.0)
BUN: 93 mg/dL — AB (ref 6–20)
CALCIUM: 8.9 mg/dL (ref 8.9–10.3)
CO2: 33 mmol/L — ABNORMAL HIGH (ref 22–32)
Chloride: 101 mmol/L (ref 101–111)
Creatinine, Ser: 1.52 mg/dL — ABNORMAL HIGH (ref 0.44–1.00)
GFR calc Af Amer: 45 mL/min — ABNORMAL LOW (ref >60)
GFR calc non Af Amer: 39 mL/min — ABNORMAL LOW (ref >60)
GLUCOSE: 153 mg/dL — AB (ref 65–99)
PHOSPHORUS: 3.2 mg/dL (ref 2.5–4.6)
Potassium: 4.7 mmol/L (ref 3.5–5.1)
SODIUM: 144 mmol/L (ref 135–145)

## 2015-10-17 LAB — GLUCOSE, CAPILLARY
GLUCOSE-CAPILLARY: 131 mg/dL — AB (ref 65–99)
GLUCOSE-CAPILLARY: 137 mg/dL — AB (ref 65–99)
GLUCOSE-CAPILLARY: 141 mg/dL — AB (ref 65–99)
GLUCOSE-CAPILLARY: 155 mg/dL — AB (ref 65–99)
Glucose-Capillary: 112 mg/dL — ABNORMAL HIGH (ref 65–99)
Glucose-Capillary: 129 mg/dL — ABNORMAL HIGH (ref 65–99)
Glucose-Capillary: 141 mg/dL — ABNORMAL HIGH (ref 65–99)

## 2015-10-17 LAB — CBC
HEMATOCRIT: 24.6 % — AB (ref 35.0–47.0)
Hemoglobin: 7.3 g/dL — ABNORMAL LOW (ref 12.0–16.0)
MCH: 30.2 pg (ref 26.0–34.0)
MCHC: 29.8 g/dL — AB (ref 32.0–36.0)
MCV: 101.5 fL — ABNORMAL HIGH (ref 80.0–100.0)
Platelets: 166 10*3/uL (ref 150–440)
RBC: 2.43 MIL/uL — ABNORMAL LOW (ref 3.80–5.20)
RDW: 16.8 % — AB (ref 11.5–14.5)
WBC: 13.5 10*3/uL — ABNORMAL HIGH (ref 3.6–11.0)

## 2015-10-17 MED ORDER — ALBUMIN HUMAN 25 % IV SOLN
25.0000 g | Freq: Once | INTRAVENOUS | Status: AC
  Administered 2015-10-17: 25 g via INTRAVENOUS
  Filled 2015-10-17: qty 100

## 2015-10-17 MED ORDER — SACCHAROMYCES BOULARDII 250 MG PO CAPS
250.0000 mg | ORAL_CAPSULE | Freq: Two times a day (BID) | ORAL | Status: DC
  Administered 2015-10-17 – 2015-11-22 (×73): 250 mg via ORAL
  Filled 2015-10-17 (×71): qty 1

## 2015-10-17 NOTE — Progress Notes (Signed)
PT Cancellation Note  Patient Details Name: Courtney [name] MRN: [id number] DOB: 1966-05-21   Cancelled Treatment:    Reason Eval/Treat Not Completed: Patient at procedure or test/unavailable (Patient currently receiving dialysis; will re-attempt at later time/date as medically appropriate.)  Arlyn Buerkle H. Manson PasseyBrown, PT, DPT, NCS 10/17/2015, 3:51 PM [phone]

## 2015-10-17 NOTE — Progress Notes (Addendum)
[facility] [place] Critical Care Medicine Progess Note  Name: Courtney [name] MRN: [id number] DOB: [birth year]    ADMISSION DATE:  08/17/2015   INITIAL PRESENTATION:  49 AAF who has been in medical facilities (hosp, LTAC, rehab) for 2 yrs following gastric bypass surgery with multiple complications. Now with chronic trach, ESRD, profound debilitation, severe sacral pressure ulcer. Was seemingly making progress and transferred to rehab facility approx one week prior to this admission. She was sent to [facility] ED with AMS and hypotension. Working dx of severe sepsis/septic shock due to infected sacral pressure ulcer. Since admission. Her course is been very complicated with numerous complications including septic shock and GI bleeding.    INTERVAL HISTORY:  The patient continues to be on the ventilator this morning. She is awake and alert, but continues to be on the ventilator and cannot verbalize. She is continued to have watery stools.    MAJOR EVENTS/TEST RESULTS: Admission 02/07/14-05/07/14 Admission 07/21/14-09/06/14 Discharged to [facility]. Pt had palliative consult at that time, were asked to sign off by husband.  09/23 CT head: NAD 09/23 EEG: no epileptiform activity 09/23 PRBCs for Hgb 6.4 09/24 bedside debridement of sacral wound. Abscess drained 09/25 Off vasopressors. More alert. No distress. Worsening thrombocytopenia. Vanc DC'd 09/29  Dr. Sampson Goon (I.D) excused from the case by patient's husband. 10/02 MRI -multiple infarcts 10/03 tracheal bleeding- transfused platelets 10/03 hospitalist service excused from the case by patient's husband 10/03 Echocardiogram ejection fraction was 55-60%, pulmonary systolic pressure was 39 mmHg 40/98 restart TF's at lower rate, attempt reg HD  10/12 Transferred to med-surg floor. Remains on PCCM service 10/14 SLP eval: pt unable to tolerate PMV adequately 10/17-will re-attempt PM valve-discussed with Speech therapist 10/18 passed swallow  eval-start pureed thick foods no thin liquids-continue NG feeds 10/19 transferred to step down for sepsis/aspiration pneumonia 10/19 cxr shows RLL opacity 10/21 sacral decub debride at bedside by surgery 10/22 started back on vasopressors while on HD 10/27 CT with osteo, R hip fx, unable to identify tip of dubhoff tube - sent for fluoro study 10/28 Ortho consultation: I do not feel that she is a surgical candidate. Therefore, I feel that it would best to manage this fracture nonsurgically and allow it to heal by itself over time, which it should.  10/28 Gen Surg consultation: Due to the lack of free air or free flow of contrast and the peritoneum there is no indication for any surgical intervention on this. Would recommend pulling back the feeding tube 1-2 cm. Would recheck an abdominal film to confirm no pneumoperitoneum in the morning. Absence of any changes okay to continue using Dobbhoff for feeding and medications. 10/28 gastrograffin study: The study confirms that the feeding tube pes perforated through the duodenum and the tip is within a cavity that fills with injected contrast. The cavity does appear walled off 11/4 refuses oral feeds, continue TF's CT reviewed: T8-T9 discitis/osteomyelitis, RT HIP FRACTURE 11/5 placed back on vasopressors, placed back on Vent due to resp acidosis, levophed turned off 11/6 afternoon 11/5 PM Trach changed out due to cuff leak, #6 Shiley cuffed, 11/6 dubhoff occluded, removed and replaced, new dubhoff shows tube in the antral stomach 11/15 refusing to take oral feeds 11/18 placed back on Vent for increased WOB,SOB  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 History of  sepsis from C. glabrata MRSA PCR 9/23 >> NEG Wound (swab) 9/23 >>  multiple organisms Wound (debridement) 9/24 >> Enterococcus, K. Pneumoniae, P. Mirabilis, VRE Wound 9/26 >> No growth Blood 9/23 >> NEG CDiff 9/27>>neg Stool Cx 10/15>> negative Cdiff 10/25>>neg Trach Aspirate 10/28>> light growth pseudomonas Blood 10/28 >> 1/2 GPC >>  Sputum cultures obtained 09/22/15 due to mucus plugging>>Pseudomonas BONE TISSUE Cx 11/3>>E.coli (ESBL), k. Pneumonia (daptomycin and septra) Bld Cx 11/27>>negative thus far.  Trach Asp 11/27>>  ANTIMICROBIALS:  Aztreonam 9/23 >> 9/24 Vanc 9/23 >> 9/25 Vanc 9/26>>9/27 Daptomycin 9/27>> 10/12, 10/28>>  Meropenem 9/23 >> 10/14, 10/28 >> 10/31 Septra 11/8>> Zosyn 11/27>>off  VITAL SIGNS: Temp:  [99.5 F (37.5 C)-100.3 F (37.9 C)] 99.5 F (37.5 C) (11/27 1800) Pulse Rate:  [32-117] 110 (11/28 0600) Resp:  [21-34] 28 (11/28 0600) BP: (99-126)/(60-85) 109/66 mmHg (11/28 0600) SpO2:  [92 %-100 %] 95 % (11/28 0600) FiO2 (%):  [28 %-35 %] 28 % (11/28 0712) HEMODYNAMICS:   VENTILATOR SETTINGS: Vent Mode:  [-] PRVC FiO2 (%):  [28 %-35 %] 28 % Set Rate:  [14 bmp] 14 bmp Vt Set:  [500 mL] 500 mL PEEP:  [5 cmH20] 5 cmH20 INTAKE / OUTPUT:  Intake/Output Summary (Last 24 hours) at 10/17/15 1131 Last data filed at 10/17/15 1100  Gross per 24 hour  Intake    130 ml  Output      0 ml  Net    130 ml   PHYSICAL EXAMINATION: Physical Examination:   VS: BP 109/66 mmHg  Pulse 110  Temp(Src) 99.5 F (37.5 C) (Axillary)  Resp 28  Ht  (1.753 m)  Wt 97 kg (213 lb 13.5 oz)  BMI 31.57 kg/m2  SpO2 95%  LMP  (LMP Unknown)  General Appearance: No resp distress, coarse upper airway sounds, off vent, on TCT Neuro: profoundly diffusely weak, alert today, nodding head HEENT: cushingoid facies, PERRLA, EOM intact Neck: trach site clean Pulmonary: clear anteriorly Cardiovascular: reg, + syst murmur Abdomen: soft, NT, +BS Extremities: warm, R>L UE edema Examination of the sacral decubitus ulcer- showed a very deep  ulcer approximately 7-8 cm across.   stage IV ulcer.   LABORATORY PANEL:   CBC  Recent Labs Lab 10/16/15 0625  WBC 11.6*  HGB 7.4*  HCT 24.6*  PLT 169    Chemistries   Recent Labs Lab 10/16/15 0625  NA 144  K 4.2  CL 102  CO2 32  GLUCOSE 166*  BUN 66*  CREATININE 1.22*  CALCIUM 8.7*  PHOS 2.2*       IMPRESSION/PLAN:  Prolonged intermittent VDRF Review of chest x-ray from 10/16/2015 shows increasing left lower lobe atelectasis versus infiltrate.--Is concerning for possible recurrence of pneumonia. Will restart the patient on vancomycin and continue Zosyn, check CBC, recheck sputum culture. -Possible sepsis. Patient had a fever yesterday of 102.6.  The patient's stage IV decubitus ulcer appears to have worsened with a new fistulous tract leading to the ulcer.  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 Diff Stool incontinence - husband has refused to allow rectal tube placement Acute on chronic anemia Thrombocytopenia, resolved - HIT panel negative 9/25 Recurrent Severe sepsis, due to sacral pressure ulcer ID following  DM 2, controlled.  Chronic steroid therapy, for a history of of adrenal insufficiency Incidental finding of R hip fx - conservative mgmt. Waxing and waning encephalopathy - she was evaluated by both the psychiatry and neurology services, and it  was deemed that the patient lacks decisional capacity at this time. 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 Husband declined TEE Profound deconditioning-criticall illness neuropathy Chronic pain  Cont vent support - settings reviewed and/or adjusted Wean in PSV <> ATC as tolerated Cont vent bundle Daily SBT if/when meets criteria Monitor BMET intermittently Monitor I/Os Correct electrolytes as indicated Intermittent HD per Renal service Cont TFs as tolerated Cont SUP Cont DVT px Cont TMP-SMX per ID service DVT px: SQ  heparin Monitor CBC intermittently Transfuse per usual ICU guidelines Cont analgesics PRN Cont to work on [employer] CorporationLTACH placement   Husband updated at phone number [phone]. Discussed the husbands concern regarding the patients continued diarrhea, also brought up the patients new problems of recurrent pneumonia with sepsis, tachypnea.    Wells Guileseep Alter Moss M.D.   Critical Care Attestation.  I have personally obtained a history, examined the patient, evaluated laboratory and imaging results, formulated the assessment and plan and placed orders. The case was discussed with the critical care RN, nutrition, respiratory therapist, Associate Professorunit manager. The Patient requires high complexity decision making for assessment and support, frequent evaluation and titration of therapies, application of advanced monitoring technologies and extensive interpretation of multiple databases. The patient has critical illness that could lead imminently to failure of 1 or more organ systems and requires the highest level of physician preparedness to intervene.  Critical Care Time devoted to patient care services described in this note is 35 minutes and is exclusive of time spent in procedures.

## 2015-10-17 NOTE — Progress Notes (Signed)
   10/17/15 1300  Clinical Encounter Type  Visited With Patient  Visit Type Follow-up  Consult/Referral To Chaplain  Spiritual Encounters  Spiritual Needs Emotional  Stress Factors  Patient Stress Factors None identified  Chaplain rounded in the unit and offered a compassionate presence and support to the patient. Held her hand, observed and said a silent prayer. Chaplain Emarion Toral A. Tresia Revolorio Ext. [phone]

## 2015-10-17 NOTE — Progress Notes (Signed)
Subjective:  Highest temperature over the past 24 hours was 100.6. 2 sets of blood cultures are thus far negative. Patient due for hemodialysis today. She remains on the ventilator at this time.     Objective:  Vital signs in last 24 hours:  Temp:  [99.5 F (37.5 C)-100.6 F (38.1 C)] 99.5 F (37.5 C) (11/27 1800) Pulse Rate:  [32-117] 110 (11/28 0600) Resp:  [21-34] 28 (11/28 0600) BP: (99-126)/(54-85) 109/66 mmHg (11/28 0600) SpO2:  [92 %-100 %] 95 % (11/28 0600) FiO2 (%):  [28 %-35 %] 28 % (11/28 0712)  Weight change:  Filed Weights    Intake/Output: I/O last 3 completed shifts: In: 20 [I.V.:20] Out: -    Intake/Output this shift:     Physical Exam: General: No acute distress, chronically ill appearing  Head: NG in place, OM moist  Eyes: anicteric  Neck: Tracheostomy in place  Lungs:  Scattered rhonchi, vent assisted fio2 28%  Heart: S1S2 +tachycardia  Abdomen:  Soft, nontender  Extremities: 2+ dependent edema, anasarca  Neurologic: Lethargic but arousable  Access:  R IJ permcath.       Basic Metabolic Panel:  Recent Labs Lab 10/12/15 0636 10/14/15 0514 10/15/15 1223 10/16/15 0625  NA 142 145  --  144  K 4.7 4.2  --  4.2  CL 101 105  --  102  CO2 32 32  --  32  GLUCOSE 136* 109*  --  166*  BUN 84* 74*  --  66*  CREATININE 1.42* 1.20*  --  1.22*  CALCIUM 8.5* 8.5*  --  8.7*  PHOS 5.1*  --  1.9* 2.2*    Liver Function Tests:  Recent Labs Lab 10/12/15 0636  ALBUMIN 2.2*   No results for input(s): LIPASE, AMYLASE in the last 168 hours. No results for input(s): AMMONIA in the last 168 hours.  CBC:  Recent Labs Lab 10/12/15 0636 10/14/15 0514 10/16/15 0625  WBC 8.7 12.3* 11.6*  NEUTROABS  --  10.9* 9.9*  HGB 8.0* 7.5* 7.4*  HCT 25.9* 25.0* 24.6*  MCV 102.5* 101.6* 101.1*  PLT 190 183 169    Cardiac Enzymes: No results for input(s): CKTOTAL, CKMB, CKMBINDEX, TROPONINI in the last 168 hours.  BNP: Invalid input(s):  POCBNP  CBG:  Recent Labs Lab 10/16/15 1529 10/16/15 2117 10/17/15 0022 10/17/15 0457 10/17/15 0716  GLUCAP 141* 97 131* 137* 141*    Microbiology: Results for orders placed or performed during the hospital encounter of September 04, 2015  Blood Culture (routine x 2)     Status: None   Collection Time: 09/04/2015  8:51 AM  Result Value Ref Range Status   Specimen Description BLOOD [name]  Final   Special Requests BOTTLES DRAWN AEROBIC AND ANAEROBIC  3CC  Final   Culture NO GROWTH 5 DAYS  Final   Report Status 08/17/2015 FINAL  Final  Blood Culture (routine x 2)     Status: None   Collection Time: Sep 04, 2015  9:20 AM  Result Value Ref Range Status   Specimen Description BLOOD LEFT ARM  Final   Special Requests BOTTLES DRAWN AEROBIC AND ANAEROBIC  1CC  Final   Culture NO GROWTH 5 DAYS  Final   Report Status 08/17/2015 FINAL  Final  Wound culture     Status: None   Collection Time: 2015-09-04  9:20 AM  Result Value Ref Range Status   Specimen Description DECUBITIS  Final   Special Requests Normal  Final   Gram Stain   Final  FEW WBC SEEN MANY GRAM NEGATIVE RODS RARE GRAM POSITIVE COCCI    Culture   Final    HEAVY GROWTH ESCHERICHIA COLI MODERATE GROWTH ENTEROBACTER AEROGENES PROTEUS MIRABILIS HEAVY GROWTH ENTEROCOCCUS SPECIES VRE HAVE INTRINSIC RESISTANCE TO MOST COMMONLY USED ANTIBIOTICS AND THE ABILITY TO ACQUIRE RESISTANCE TO MOST AVAILABLE ANTIBIOTICS.    Report Status 08/16/2015 FINAL  Final   Organism ID, Bacteria ESCHERICHIA COLI  Final   Organism ID, Bacteria ENTEROBACTER AEROGENES  Final   Organism ID, Bacteria PROTEUS MIRABILIS  Final   Organism ID, Bacteria ENTEROCOCCUS SPECIES  Final      Susceptibility   Enterobacter aerogenes - MIC*    CEFTAZIDIME <=1 SENSITIVE Sensitive     CEFAZOLIN >=64 RESISTANT Resistant     CEFTRIAXONE <=1 SENSITIVE Sensitive     CIPROFLOXACIN <=0.25 SENSITIVE Sensitive     GENTAMICIN <=1 SENSITIVE Sensitive     IMIPENEM 1 SENSITIVE  Sensitive     TRIMETH/SULFA <=20 SENSITIVE Sensitive     * MODERATE GROWTH ENTEROBACTER AEROGENES   Escherichia coli - MIC*    AMPICILLIN <=2 SENSITIVE Sensitive     CEFTAZIDIME <=1 SENSITIVE Sensitive     CEFAZOLIN <=4 SENSITIVE Sensitive     CEFTRIAXONE <=1 SENSITIVE Sensitive     CIPROFLOXACIN <=0.25 SENSITIVE Sensitive     GENTAMICIN <=1 SENSITIVE Sensitive     IMIPENEM <=0.25 SENSITIVE Sensitive     TRIMETH/SULFA <=20 SENSITIVE Sensitive     * HEAVY GROWTH ESCHERICHIA COLI   Proteus mirabilis - MIC*    AMPICILLIN >=32 RESISTANT Resistant     CEFTAZIDIME <=1 SENSITIVE Sensitive     CEFAZOLIN 8 SENSITIVE Sensitive     CEFTRIAXONE <=1 SENSITIVE Sensitive     CIPROFLOXACIN <=0.25 SENSITIVE Sensitive     GENTAMICIN <=1 SENSITIVE Sensitive     IMIPENEM 1 SENSITIVE Sensitive     TRIMETH/SULFA <=20 SENSITIVE Sensitive     * PROTEUS MIRABILIS   Enterococcus species - MIC*    AMPICILLIN >=32 RESISTANT Resistant     VANCOMYCIN >=32 RESISTANT Resistant     GENTAMICIN SYNERGY SENSITIVE Sensitive     TETRACYCLINE Value in next row Resistant      RESISTANT>=16    * HEAVY GROWTH ENTEROCOCCUS SPECIES  MRSA PCR Screening     Status: None   Collection Time: 07/31/2015  2:38 PM  Result Value Ref Range Status   MRSA by PCR NEGATIVE NEGATIVE Final    Comment:        The GeneXpert MRSA Assay (FDA approved for NASAL specimens only), is one component of a comprehensive MRSA colonization surveillance program. It is not intended to diagnose MRSA infection nor to guide or monitor treatment for MRSA infections.   Blood culture (single)     Status: None   Collection Time: 07/26/2015  3:36 PM  Result Value Ref Range Status   Specimen Description BLOOD RIGHT ASSIST CONTROL  Final   Special Requests BOTTLES DRAWN AEROBIC AND ANAEROBIC  Final   Culture NO GROWTH 5 DAYS  Final   Report Status 08/17/2015 FINAL  Final  Wound culture     Status: None   Collection Time: 08/13/15 12:37 PM  Result  Value Ref Range Status   Specimen Description WOUND  Final   Special Requests Normal  Final   Gram Stain   Final    FEW WBC SEEN TOO NUMEROUS TO COUNT GRAM NEGATIVE RODS FEW GRAM POSITIVE COCCI    Culture   Final    HEAVY GROWTH  ESCHERICHIA COLI MODERATE GROWTH PROTEUS MIRABILIS LIGHT GROWTH KLEBSIELLA PNEUMONIAE MODERATE GROWTH ENTEROCOCCUS GALLINARUM CRITICAL RESULT CALLED TO, READ BACK BY AND VERIFIED WITH: [facility] LLCANDRA BORBA AT 1042 08/16/15 DV    Report Status 08/17/2015 FINAL  Final   Organism ID, Bacteria ESCHERICHIA COLI  Final   Organism ID, Bacteria PROTEUS MIRABILIS  Final   Organism ID, Bacteria KLEBSIELLA PNEUMONIAE  Final   Organism ID, Bacteria ENTEROCOCCUS GALLINARUM  Final      Susceptibility   Escherichia coli - MIC*    AMPICILLIN >=32 RESISTANT Resistant     CEFTAZIDIME 4 RESISTANT Resistant     CEFAZOLIN >=64 RESISTANT Resistant     CEFTRIAXONE 16 RESISTANT Resistant     GENTAMICIN 2 SENSITIVE Sensitive     IMIPENEM >=16 RESISTANT Resistant     TRIMETH/SULFA <=20 SENSITIVE Sensitive     Extended ESBL POSITIVE Resistant     PIP/TAZO Value in next row Resistant      RESISTANT>=128    CIPROFLOXACIN Value in next row Sensitive      SENSITIVE<=0.25    * HEAVY GROWTH ESCHERICHIA COLI   Klebsiella pneumoniae - MIC*    AMPICILLIN Value in next row Resistant      SENSITIVE<=0.25    CEFTAZIDIME Value in next row Resistant      SENSITIVE<=0.25    CEFAZOLIN Value in next row Resistant      SENSITIVE<=0.25    CEFTRIAXONE Value in next row Resistant      SENSITIVE<=0.25    CIPROFLOXACIN Value in next row Resistant      SENSITIVE<=0.25    GENTAMICIN Value in next row Sensitive      SENSITIVE<=0.25    IMIPENEM Value in next row Resistant      SENSITIVE<=0.25    TRIMETH/SULFA Value in next row Resistant      SENSITIVE<=0.25    PIP/TAZO Value in next row Resistant      RESISTANT>=128    * LIGHT GROWTH KLEBSIELLA PNEUMONIAE   Proteus mirabilis - MIC*    AMPICILLIN  Value in next row Resistant      RESISTANT>=128    CEFTAZIDIME Value in next row Sensitive      RESISTANT>=128    CEFAZOLIN Value in next row Sensitive      RESISTANT>=128    CEFTRIAXONE Value in next row Sensitive      RESISTANT>=128    CIPROFLOXACIN Value in next row Sensitive      RESISTANT>=128    GENTAMICIN Value in next row Sensitive      RESISTANT>=128    IMIPENEM Value in next row Sensitive      RESISTANT>=128    TRIMETH/SULFA Value in next row Sensitive      RESISTANT>=128    PIP/TAZO Value in next row Sensitive      SENSITIVE<=4    * MODERATE GROWTH PROTEUS MIRABILIS   Enterococcus gallinarum - MIC*    AMPICILLIN Value in next row Resistant      SENSITIVE<=4    GENTAMICIN SYNERGY Value in next row Sensitive      SENSITIVE<=4    CIPROFLOXACIN Value in next row Resistant      RESISTANT>=8    TETRACYCLINE Value in next row Resistant      RESISTANT>=16    * MODERATE GROWTH ENTEROCOCCUS GALLINARUM  Wound culture     Status: None   Collection Time: 08/15/15  2:53 PM  Result Value Ref Range Status   Specimen Description WOUND  Final   Special Requests NONE  Final  Gram Stain FEW WBC SEEN NO ORGANISMS SEEN   Final   Culture NO GROWTH 3 DAYS  Final   Report Status 08/18/2015 FINAL  Final  C difficile quick scan w PCR reflex     Status: None   Collection Time: 08/17/15 11:34 AM  Result Value Ref Range Status   C Diff antigen NEGATIVE NEGATIVE Final   C Diff toxin NEGATIVE NEGATIVE Final   C Diff interpretation Negative for C. difficile  Final  Stool culture     Status: None   Collection Time: 09/03/15  3:51 PM  Result Value Ref Range Status   Specimen Description STOOL  Final   Special Requests Immunocompromised  Final   Culture   Final    NO SALMONELLA OR SHIGELLA ISOLATED No Pathogenic E. coli detected NO CAMPYLOBACTER DETECTED    Report Status 09/07/2015 FINAL  Final  C difficile quick scan w PCR reflex     Status: None   Collection Time: 09/13/15 12:51 AM   Result Value Ref Range Status   C Diff antigen NEGATIVE NEGATIVE Final   C Diff toxin NEGATIVE NEGATIVE Final   C Diff interpretation Negative for C. difficile  Final  Culture, blood (routine x 2)     Status: None   Collection Time: 09/16/15 11:24 AM  Result Value Ref Range Status   Specimen Description BLOOD LEFT HAND  Final   Special Requests BOTTLES DRAWN AEROBIC AND ANAEROBIC  1CC  Final   Culture NO GROWTH 5 DAYS  Final   Report Status 09/21/2015 FINAL  Final  Culture, blood (routine x 2)     Status: None   Collection Time: 09/16/15 12:23 PM  Result Value Ref Range Status   Specimen Description BLOOD RIGHT HAND  Final   Special Requests BOTTLES DRAWN AEROBIC AND ANAEROBIC  1CC  Final   Culture  Setup Time   Final    GRAM POSITIVE COCCI AEROBIC BOTTLE ONLY CRITICAL RESULT CALLED TO, READ BACK BY AND VERIFIED WITH: TESS THOMAS,RN 09/17/2015 0631 BY JRS.    Culture   Final    ENTEROCOCCUS FAECALIS AEROBIC BOTTLE ONLY VRE HAVE INTRINSIC RESISTANCE TO MOST COMMONLY USED ANTIBIOTICS AND THE ABILITY TO ACQUIRE RESISTANCE TO MOST AVAILABLE ANTIBIOTICS. CRITICAL RESULT CALLED TO, READ BACK BY AND VERIFIED WITH: CHERYL SMITH AT 1610 09/19/15 DV    Report Status 09/21/2015 FINAL  Final   Organism ID, Bacteria ENTEROCOCCUS FAECALIS  Final      Susceptibility   Enterococcus faecalis - MIC*    AMPICILLIN <=2 SENSITIVE Sensitive     LINEZOLID 2 SENSITIVE Sensitive     CIPROFLOXACIN Value in next row Resistant      RESISTANT>=8    TETRACYCLINE Value in next row Resistant      RESISTANT>=16    VANCOMYCIN Value in next row Resistant      RESISTANT>=32    GENTAMICIN SYNERGY Value in next row Resistant      RESISTANT>=32    * ENTEROCOCCUS FAECALIS  Culture, respiratory (NON-Expectorated)     Status: None   Collection Time: 09/16/15  3:50 PM  Result Value Ref Range Status   Specimen Description TRACHEAL ASPIRATE  Final   Special Requests Immunocompromised  Final   Gram Stain   Final     FEW WBC SEEN GOOD SPECIMEN - 80-90% WBCS RARE GRAM NEGATIVE RODS    Culture LIGHT GROWTH PSEUDOMONAS AERUGINOSA  Final   Report Status 09/19/2015 FINAL  Final   Organism ID, Bacteria PSEUDOMONAS AERUGINOSA  Final      Susceptibility   Pseudomonas aeruginosa - MIC*    CEFTAZIDIME 8 SENSITIVE Sensitive     CIPROFLOXACIN 2 INTERMEDIATE Intermediate     GENTAMICIN >=16 RESISTANT Resistant     IMIPENEM >=16 RESISTANT Resistant     PIP/TAZO Value in next row Sensitive      SENSITIVE32    CEFEPIME Value in next row Sensitive      SENSITIVE8    LEVOFLOXACIN Value in next row Resistant      RESISTANT>=8    * LIGHT GROWTH PSEUDOMONAS AERUGINOSA  Culture, expectorated sputum-assessment     Status: None   Collection Time: 09/22/15  2:09 PM  Result Value Ref Range Status   Specimen Description ENDOTRACHEAL  Final   Special Requests Normal  Final   Sputum evaluation THIS SPECIMEN IS ACCEPTABLE FOR SPUTUM CULTURE  Final   Report Status 09/24/2015 FINAL  Final  Culture, respiratory (NON-Expectorated)     Status: None   Collection Time: 09/22/15  2:09 PM  Result Value Ref Range Status   Specimen Description ENDOTRACHEAL  Final   Special Requests Normal Reflexed from [id number]  Final   Gram Stain   Final    FEW WBC SEEN FEW GRAM NEGATIVE RODS POOR SPECIMEN - LESS THAN 70% WBCS    Culture   Final    MODERATE GROWTH PSEUDOMONAS AERUGINOSA LIGHT GROWTH KLEBSIELLA PNEUMONIAE REFER TO SENSITIVITIES FROM PREVIOUS CULTURE FOR ORG 2    Report Status 10/01/2015 FINAL  Final   Organism ID, Bacteria PSEUDOMONAS AERUGINOSA  Final   Organism ID, Bacteria KLEBSIELLA PNEUMONIAE  Final      Susceptibility   Pseudomonas aeruginosa - MIC*    CEFTAZIDIME 4 SENSITIVE Sensitive     CIPROFLOXACIN 2 INTERMEDIATE Intermediate     GENTAMICIN >=16 RESISTANT Resistant     IMIPENEM >=16 RESISTANT Resistant     PIP/TAZO Value in next row Sensitive      SENSITIVE16    * MODERATE GROWTH PSEUDOMONAS AERUGINOSA   Tissue culture     Status: None   Collection Time: 09/24/15  6:44 AM  Result Value Ref Range Status   Specimen Description BONE  Final   Special Requests Normal  Final   Gram Stain MODERATE WBC SEEN FEW GRAM NEGATIVE RODS   Final   Culture   Final    MODERATE GROWTH ESCHERICHIA COLI MODERATE GROWTH KLEBSIELLA PNEUMONIAE ESBL-EXTENDED SPECTRUM BETA LACTAMASE-THE ORGANISM IS RESISTANT TO PENICILLINS, CEPHALOSPORINS AND AZTREONAM ACCORDING TO CLSI [id number] VOL.25 N01 JAN 2005. ORGANISM 1 This organism isolate is resistant to one or more antiotic agents in three or more antimicrobial categories.  Suggest Infectious Disease consult.   ORGANISM 2 CRITICAL RESULT CALLED TO, READ BACK BY AND VERIFIED WITH: RN Mardene Celeste LINDSAY 09/27/15 1005AM    Report Status 09/28/2015 FINAL  Final   Organism ID, Bacteria ESCHERICHIA COLI  Final   Organism ID, Bacteria KLEBSIELLA PNEUMONIAE  Final      Susceptibility   Escherichia coli - MIC*    AMPICILLIN >=32 RESISTANT Resistant     CEFTAZIDIME 4 RESISTANT Resistant     CEFAZOLIN >=64 RESISTANT Resistant     CEFTRIAXONE 8 RESISTANT Resistant     GENTAMICIN <=1 SENSITIVE Sensitive     IMIPENEM 8 RESISTANT Resistant     TRIMETH/SULFA <=20 SENSITIVE Sensitive     Extended ESBL POSITIVE Resistant     PIP/TAZO Value in next row Resistant      RESISTANT>=128    * MODERATE GROWTH ESCHERICHIA  COLI   Klebsiella pneumoniae - MIC*    AMPICILLIN Value in next row Resistant      RESISTANT>=128    CEFTAZIDIME Value in next row Resistant      RESISTANT>=128    CEFAZOLIN Value in next row Resistant      RESISTANT>=128    CEFTRIAXONE Value in next row Resistant      RESISTANT>=128    CIPROFLOXACIN Value in next row Resistant      RESISTANT>=128    GENTAMICIN Value in next row Resistant      RESISTANT>=128    IMIPENEM Value in next row Resistant      RESISTANT>=128    TRIMETH/SULFA Value in next row Sensitive      RESISTANT>=128    PIP/TAZO Value in next row  Resistant      RESISTANT>=128    * MODERATE GROWTH KLEBSIELLA PNEUMONIAE  Anaerobic culture     Status: None   Collection Time: 09/24/15  3:55 PM  Result Value Ref Range Status   Specimen Description BONE  Final   Special Requests Normal  Final   Culture NO ANAEROBES ISOLATED  Final   Report Status 09/29/2015 FINAL  Final  C difficile quick scan w PCR reflex     Status: None   Collection Time: 10/07/15  2:02 PM  Result Value Ref Range Status   C Diff antigen NEGATIVE NEGATIVE Final   C Diff toxin NEGATIVE NEGATIVE Final   C Diff interpretation Negative for C. difficile  Final  Culture, blood (routine x 2)     Status: None (Preliminary result)   Collection Time: 10/16/15  9:52 AM  Result Value Ref Range Status   Specimen Description BLOOD LEFT HAND  Final   Special Requests   Final    BOTTLES DRAWN AEROBIC AND ANAEROBIC  AER 6CC ANA 4CC   Culture NO GROWTH < 24 HOURS  Final   Report Status PENDING  Incomplete  Culture, blood (routine x 2)     Status: None (Preliminary result)   Collection Time: 10/16/15 12:25 PM  Result Value Ref Range Status   Specimen Description BLOOD LEFT HAND  Final   Special Requests BOTTLES DRAWN AEROBIC AND ANAEROBIC  5CC  Final   Culture NO GROWTH < 24 HOURS  Final   Report Status PENDING  Incomplete    Coagulation Studies: No results for input(s): LABPROT, INR in the last 72 hours.  Urinalysis: No results for input(s): COLORURINE, LABSPEC, PHURINE, GLUCOSEU, HGBUR, BILIRUBINUR, KETONESUR, PROTEINUR, UROBILINOGEN, NITRITE, LEUKOCYTESUR in the last 72 hours.  Invalid input(s): APPERANCEUR    Imaging: Dg Chest Port 1 View  10/16/2015  CLINICAL DATA:  Respiratory failure EXAM: PORTABLE CHEST 1 VIEW COMPARISON:  10/09/2015 FINDINGS: Tracheostomy tube and NG tube are unchanged. LEFT central venous line and RIGHT hemodialysis catheter unchanged. Stable cardiac silhouette. There is new airspace disease in the LEFT lower lobe. Small bilateral pleural  effusions. No pneumothorax. IMPRESSION: 1. Stable support apparatus. 2. New LEFT lower lobe airspace disease representing infiltrate or potentially aspiration pneumonitis. Asymmetric edema would also be considered. These results will be called to the ordering clinician or representative by the Radiologist Assistant, and communication documented in the PACS or zVision Dashboard. Electronically Signed   By: Genevive Bi M.D.   On: 10/16/2015 08:34     Medications:   . feeding supplement (VITAL AF 1.2 CAL) 1,000 mL (10/15/15 1803)  . norepinephrine Stopped (10/08/15 0530)   . albumin human  25 g Intravenous Once  .  amantadine  200 mg Per Tube Q7 days  . antiseptic oral rinse  7 mL Mouth Rinse QID  . aspirin  81 mg Oral Daily  . calcium acetate (Phos Binder)  667 mg Oral TID WC  . chlorhexidine gluconate  15 mL Mouth Rinse BID  . epoetin (EPOGEN/PROCRIT) injection  10,000 Units Intravenous Q M,W,F-HD  . escitalopram  10 mg Oral Daily  . feeding supplement (PRO-STAT SUGAR FREE 64)  30 mL Oral 6 X Daily  . folic acid  1 mg Oral Daily  . free water  20 mL Per Tube 6 times per day  . heparin subcutaneous  5,000 Units Subcutaneous Q12H  . hydrocortisone  10 mg Per Tube BID  . insulin aspart  0-9 Units Subcutaneous 6 times per day  . ipratropium-albuterol  3 mL Nebulization QID  . lidocaine  1 patch Transdermal Q24H  . loperamide  2 mg Oral Daily  . midodrine  5 mg Per Tube TID WC  . multivitamin  5 mL Oral Daily  . pantoprazole sodium  40 mg Per Tube Daily  . sodium chloride  10-40 mL Intracatheter Q12H  . sulfamethoxazole-trimethoprim  2.5 tablet Per Tube q1800  . thiamine  100 mg Per Tube Daily   sodium chloride, sodium chloride, acetaminophen (TYLENOL) oral liquid 160 mg/5 mL, HYDROmorphone (DILAUDID) injection, morphine injection, ondansetron (ZOFRAN) IV, oxyCODONE, sodium chloride, zinc oxide  Assessment/ Plan:  49 y.o. 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 Admitted on 09-05-2015  1. End-stage renal disease on hemodialysis on HD MWF. The patient has been on dialysis since October of 2014. R IJ permcath. Dialysis treatment with IV albumin for oncotic support.  - Patient due for hemodialysis today. Orders have been prepared. Use albumin for blood pressure support and low blood flow rate..   2. Anemia of CKD: Hemoglobin overall remains low at 7.4. Continue Epogen with dialysis. Transfuse for hemoglobin of 7 or less..  3. Secondary hyperparathyroidism: PTH low at 55 (9/25) -Patient has not required Binder therapy as her phosphorus has remained low.   4. Sepsis: VRE in blood. Bone cultures E. Coli and klebsiella - Treatment as per ID and ICU team: daptomycin completed on 11/10 and now on trimethoprim/sulfamethoxazole. Patient now spiking fevers again. Blood cultures from yesterday are thus far negative.  5. Generalized Dependent edema/Anasarca due to 3rd spacing -  Continue ultrafiltration with hemodialysis as tolerated.  6. Acute resp failure - New infiltrate was noted in the left lower lobe yesterday. Could potentially represent aspiration. Antibiotic therapy per ID and pulmonary critical care.   LOS: 66 Courtney [name] 11/28/20169:31 AM

## 2015-10-17 NOTE — Progress Notes (Signed)
MEDICATION RELATED CONSULT NOTE - Follow up   Pharmacy Consult for Renal dosing  Allergies  Allergen Reactions  . Contrast Media [Iodinated Diagnostic Agents] Anaphylaxis  . Ampicillin Rash    Patient Measurements: Height:  (175.3 cm) Weight:  (not able to weigh due to bed ) IBW/kg (Calculated) : 66.2  Vital Signs: BP: 109/66 mmHg (11/28 0600) Pulse Rate: 110 (11/28 0600) Intake/Output from previous day: 11/27 0701 - 11/28 0700 In: 10 [I.V.:10] Out: -  Intake/Output from this shift: Total I/O In: 120 [NG/GT:120] Out: -   Labs:  Recent Labs  10/15/15 1223 10/16/15 0625  WBC  --  11.6*  HGB  --  7.4*  HCT  --  24.6*  PLT  --  169  CREATININE  --  1.22*  PHOS 1.9* 2.2*   Estimated Creatinine Clearance: 68.4 mL/min (by C-G formula based on Cr of 1.22).   Microbiology: Recent Results (from the past 720 hour(s))  Culture, expectorated sputum-assessment     Status: None   Collection Time: 09/22/15  2:09 PM  Result Value Ref Range Status   Specimen Description ENDOTRACHEAL  Final   Special Requests Normal  Final   Sputum evaluation THIS SPECIMEN IS ACCEPTABLE FOR SPUTUM CULTURE  Final   Report Status 09/24/2015 FINAL  Final  Culture, respiratory (NON-Expectorated)     Status: None   Collection Time: 09/22/15  2:09 PM  Result Value Ref Range Status   Specimen Description ENDOTRACHEAL  Final   Special Requests Normal Reflexed from [id number]  Final   Gram Stain   Final    FEW WBC SEEN FEW GRAM NEGATIVE RODS POOR SPECIMEN - LESS THAN 70% WBCS    Culture   Final    MODERATE GROWTH PSEUDOMONAS AERUGINOSA LIGHT GROWTH KLEBSIELLA PNEUMONIAE REFER TO SENSITIVITIES FROM PREVIOUS CULTURE FOR ORG 2    Report Status 10/01/2015 FINAL  Final   Organism ID, Bacteria PSEUDOMONAS AERUGINOSA  Final   Organism ID, Bacteria KLEBSIELLA PNEUMONIAE  Final      Susceptibility   Pseudomonas aeruginosa - MIC*    CEFTAZIDIME 4 SENSITIVE Sensitive     CIPROFLOXACIN 2  INTERMEDIATE Intermediate     GENTAMICIN >=16 RESISTANT Resistant     IMIPENEM >=16 RESISTANT Resistant     PIP/TAZO Value in next row Sensitive      SENSITIVE16    * MODERATE GROWTH PSEUDOMONAS AERUGINOSA  Tissue culture     Status: None   Collection Time: 09/24/15  6:44 AM  Result Value Ref Range Status   Specimen Description BONE  Final   Special Requests Normal  Final   Gram Stain MODERATE WBC SEEN FEW GRAM NEGATIVE RODS   Final   Culture   Final    MODERATE GROWTH ESCHERICHIA COLI MODERATE GROWTH KLEBSIELLA PNEUMONIAE ESBL-EXTENDED SPECTRUM BETA LACTAMASE-THE ORGANISM IS RESISTANT TO PENICILLINS, CEPHALOSPORINS AND AZTREONAM ACCORDING TO CLSI M100-S15 VOL.25 N01 JAN 2005. ORGANISM 1 This organism isolate is resistant to one or more antiotic agents in three or more antimicrobial categories.  Suggest Infectious Disease consult.   ORGANISM 2 CRITICAL RESULT CALLED TO, READ BACK BY AND VERIFIED WITH: RN Mardene Celeste LINDSAY 09/27/15 1005AM    Report Status 09/28/2015 FINAL  Final   Organism ID, Bacteria ESCHERICHIA COLI  Final   Organism ID, Bacteria KLEBSIELLA PNEUMONIAE  Final      Susceptibility   Escherichia coli - MIC*    AMPICILLIN >=32 RESISTANT Resistant     CEFTAZIDIME 4 RESISTANT Resistant     CEFAZOLIN >=  64 RESISTANT Resistant     CEFTRIAXONE 8 RESISTANT Resistant     GENTAMICIN <=1 SENSITIVE Sensitive     IMIPENEM 8 RESISTANT Resistant     TRIMETH/SULFA <=20 SENSITIVE Sensitive     Extended ESBL POSITIVE Resistant     PIP/TAZO Value in next row Resistant      RESISTANT>=128    * MODERATE GROWTH ESCHERICHIA COLI   Klebsiella pneumoniae - MIC*    AMPICILLIN Value in next row Resistant      RESISTANT>=128    CEFTAZIDIME Value in next row Resistant      RESISTANT>=128    CEFAZOLIN Value in next row Resistant      RESISTANT>=128    CEFTRIAXONE Value in next row Resistant      RESISTANT>=128    CIPROFLOXACIN Value in next row Resistant      RESISTANT>=128    GENTAMICIN  Value in next row Resistant      RESISTANT>=128    IMIPENEM Value in next row Resistant      RESISTANT>=128    TRIMETH/SULFA Value in next row Sensitive      RESISTANT>=128    PIP/TAZO Value in next row Resistant      RESISTANT>=128    * MODERATE GROWTH KLEBSIELLA PNEUMONIAE  Anaerobic culture     Status: None   Collection Time: 09/24/15  3:55 PM  Result Value Ref Range Status   Specimen Description BONE  Final   Special Requests Normal  Final   Culture NO ANAEROBES ISOLATED  Final   Report Status 09/29/2015 FINAL  Final  C difficile quick scan w PCR reflex     Status: None   Collection Time: 10/07/15  2:02 PM  Result Value Ref Range Status   C Diff antigen NEGATIVE NEGATIVE Final   C Diff toxin NEGATIVE NEGATIVE Final   C Diff interpretation Negative for C. difficile  Final  Culture, blood (routine x 2)     Status: None (Preliminary result)   Collection Time: 10/16/15  9:52 AM  Result Value Ref Range Status   Specimen Description BLOOD LEFT HAND  Final   Special Requests   Final    BOTTLES DRAWN AEROBIC AND ANAEROBIC  AER 6CC ANA 4CC   Culture NO GROWTH < 24 HOURS  Final   Report Status PENDING  Incomplete  Culture, blood (routine x 2)     Status: None (Preliminary result)   Collection Time: 10/16/15 12:25 PM  Result Value Ref Range Status   Specimen Description BLOOD LEFT HAND  Final   Special Requests BOTTLES DRAWN AEROBIC AND ANAEROBIC  5CC  Final   Culture NO GROWTH < 24 HOURS  Final   Report Status PENDING  Incomplete    Medical History: Past Medical History  Diagnosis Date  . Obesity   . Dyslipidemia   . Hypertension   . Coronary artery disease     s/p BMS 2010 LAD  . Dysrhythmia     ventricular tachycardia resolved after LAD stent and beta blocker  . Diabetes mellitus     with retinopathy, neuropathy and microalbuminemia  . ESRD (end stage renal disease) on dialysis     Assessment: 49 yo patient on HD. Pharmacy consulted for renal dosing of medications.    Plan:  No medications require adjustment at present. Pharmacy will continue to monitor patients medications and dose adjust as needed.    Luisa HartScott Chelcy Bolda, PharmD 10/17/2015 1:56 PM

## 2015-10-18 ENCOUNTER — Inpatient Hospital Stay: Payer: 59

## 2015-10-18 DIAGNOSIS — J9602 Acute respiratory failure with hypercapnia: Secondary | ICD-10-CM

## 2015-10-18 DIAGNOSIS — J189 Pneumonia, unspecified organism: Secondary | ICD-10-CM

## 2015-10-18 DIAGNOSIS — J9601 Acute respiratory failure with hypoxia: Secondary | ICD-10-CM

## 2015-10-18 LAB — BASIC METABOLIC PANEL
ANION GAP: 9 (ref 5–15)
BUN: 57 mg/dL — ABNORMAL HIGH (ref 6–20)
CALCIUM: 8.5 mg/dL — AB (ref 8.9–10.3)
CO2: 32 mmol/L (ref 22–32)
CREATININE: 1.05 mg/dL — AB (ref 0.44–1.00)
Chloride: 102 mmol/L (ref 101–111)
GLUCOSE: 126 mg/dL — AB (ref 65–99)
Potassium: 4.1 mmol/L (ref 3.5–5.1)
Sodium: 143 mmol/L (ref 135–145)

## 2015-10-18 LAB — BLOOD GAS, ARTERIAL
ALLENS TEST (PASS/FAIL): POSITIVE — AB
Acid-Base Excess: 9.9 mmol/L — ABNORMAL HIGH (ref 0.0–3.0)
Bicarbonate: 34.3 mEq/L — ABNORMAL HIGH (ref 21.0–28.0)
FIO2: 0.3
Mechanical Rate: 16
O2 SAT: 91.8 %
PEEP: 5 cmH2O
Patient temperature: 37
VT: 500 mL
pCO2 arterial: 44 mmHg (ref 32.0–48.0)
pH, Arterial: 7.5 — ABNORMAL HIGH (ref 7.350–7.450)
pO2, Arterial: 57 mmHg — ABNORMAL LOW (ref 83.0–108.0)

## 2015-10-18 LAB — GLUCOSE, CAPILLARY
GLUCOSE-CAPILLARY: 124 mg/dL — AB (ref 65–99)
GLUCOSE-CAPILLARY: 119 mg/dL — AB (ref 65–99)
GLUCOSE-CAPILLARY: 171 mg/dL — AB (ref 65–99)
GLUCOSE-CAPILLARY: 189 mg/dL — AB (ref 65–99)
Glucose-Capillary: 171 mg/dL — ABNORMAL HIGH (ref 65–99)

## 2015-10-18 LAB — CBC
HEMATOCRIT: 21.8 % — AB (ref 35.0–47.0)
Hemoglobin: 6.6 g/dL — ABNORMAL LOW (ref 12.0–16.0)
MCH: 30.3 pg (ref 26.0–34.0)
MCHC: 30.1 g/dL — AB (ref 32.0–36.0)
MCV: 100.4 fL — AB (ref 80.0–100.0)
PLATELETS: 151 10*3/uL (ref 150–440)
RBC: 2.17 MIL/uL — ABNORMAL LOW (ref 3.80–5.20)
RDW: 17.1 % — AB (ref 11.5–14.5)
WBC: 11.3 10*3/uL — AB (ref 3.6–11.0)

## 2015-10-18 LAB — EXPECTORATED SPUTUM ASSESSMENT W REFEX TO RESP CULTURE

## 2015-10-18 LAB — EXPECTORATED SPUTUM ASSESSMENT W GRAM STAIN, RFLX TO RESP C

## 2015-10-18 LAB — C DIFFICILE QUICK SCREEN W PCR REFLEX
C DIFFICILE (CDIFF) INTERP: NEGATIVE
C DIFFICILE (CDIFF) TOXIN: NEGATIVE
C Diff antigen: NEGATIVE

## 2015-10-18 LAB — PREPARE RBC (CROSSMATCH)

## 2015-10-18 LAB — CULTURE, FUNGUS WITHOUT SMEAR: Special Requests: NORMAL

## 2015-10-18 MED ORDER — LOPERAMIDE HCL 1 MG/5ML PO LIQD
2.0000 mg | Freq: Two times a day (BID) | ORAL | Status: DC
  Administered 2015-10-18 – 2015-10-19 (×5): 2 mg via ORAL
  Filled 2015-10-18 (×5): qty 10

## 2015-10-18 MED ORDER — ACETAMINOPHEN 10 MG/ML IV SOLN
1000.0000 mg | Freq: Four times a day (QID) | INTRAVENOUS | Status: AC
Start: 2015-10-18 — End: 2015-10-19
  Administered 2015-10-18 – 2015-10-19 (×4): 1000 mg via INTRAVENOUS
  Filled 2015-10-18 (×4): qty 100

## 2015-10-18 MED ORDER — LEVOFLOXACIN IN D5W 500 MG/100ML IV SOLN
500.0000 mg | INTRAVENOUS | Status: DC
  Administered 2015-10-19: 500 mg via INTRAVENOUS
  Filled 2015-10-18: qty 100

## 2015-10-18 MED ORDER — DAKINS (1/4 STRENGTH) 0.125 % EX SOLN
Freq: Two times a day (BID) | CUTANEOUS | Status: AC
  Administered 2015-10-18 – 2015-10-19 (×3)
  Administered 2015-10-20 (×2): 1
  Administered 2015-10-20: 10:00:00
  Filled 2015-10-18 (×2): qty 473

## 2015-10-18 MED ORDER — SODIUM CHLORIDE 0.9 % IV SOLN
Freq: Once | INTRAVENOUS | Status: AC
  Administered 2015-10-18: 16:00:00 via INTRAVENOUS

## 2015-10-18 MED ORDER — LEVOFLOXACIN IN D5W 750 MG/150ML IV SOLN
750.0000 mg | INTRAVENOUS | Status: AC
  Administered 2015-10-18: 750 mg via INTRAVENOUS
  Filled 2015-10-18: qty 150

## 2015-10-18 MED ORDER — JEVITY 1.5 CAL/FIBER PO LIQD
1000.0000 mL | ORAL | Status: DC
  Administered 2015-10-18 – 2015-11-02 (×18): 1000 mL

## 2015-10-18 NOTE — Progress Notes (Signed)
Nutrition Follow-up  DOCUMENTATION CODES:   Severe malnutrition in context of acute illness/injury  INTERVENTION:   EN: asked by MD [name] to change formula back to Jevity 1.5 per husband's request.  Pt has been experiencing loose stool for most of her admission (as documented as early as 9/26); pt with hx of C.diff colitis, on chronic antibiotic therapies, hx of gastric bypass with multiple complications as previously described.  Pt has previously been on Jevity this admission, at husband's request, and diarrhea did not improve. Changed TF back to the Vital and reportedly husband was happy with this change at that time. As previously documented, do not recommend changing from the Vital as this is  an elemental, peptide based formula designed for issues with GI intolerance, malabsorption/maldigestion; do not think that changing to Jevity will help with the diarrhea and in fact, may not be as well tolerated or well absorbed/digested by pt. MD Nicholos JohnsRamachandran is aware of this but gave orders forTF to be changed to Jevity 1.5 per husband request. If C.diff is positive, recommend changing TF back to Vital.  Will continue to participate in ICU rounds but will not document on pt daily as no further recommendations at this time  NUTRITION DIAGNOSIS:   Inadequate oral intake related to wound healing, acute illness, chronic illness as evidenced by NPO status, estimated needs.   GOAL:   Patient will meet greater than or equal to 90% of their needs  MONITOR:    (Energy Intake, Anthropometrics, Digestive System, Electrolyte/Renal Profile, Glucose Profile)  REASON FOR ASSESSMENT:   Consult Enteral/tube feeding initiation and management  ASSESSMENT:    Pt with worsening sacral wound, seen by wound care RN. Pt febrile,tachycardic; loose stools continue, C.diff pending.  Pt continues on trach via vent, HD as scheduled. Pt with anemia, possible to to slwo GI bleed vs critical care associated anemia,  will receive 1 unit of PRBC  EN: Vital 1.2 AF infusing at 50 ml/hr, Prostat q 4 hours via dobhoff; pt with a dobhoff tube in place since 9/26  Skin:   (stage IV sacrum, stage III hip, unstageable foot; rectal ulcer from fleixseal, ulcer in nose from dobhoff)  Digestive System: loose stools continue  Height:   Ht Readings from Last 1 Encounters:  2015/04/05 5\' 9"  (1.753 m)    Weight:   Wt Readings from Last 1 Encounters:  01/31/12 319 lb 8 oz (144.924 kg)    BMI:  Body mass index is 31.57 kg/(m^2).  Estimated Nutritional Needs:   Kcal:  TEE 1851 kcals/d (Ve 7.4, Tmax 37.4) Using current wt (97kg) edema noted (re-intubated)  Protein:  (1.5-2.0 g/kg)145-194 g/d but likely closer to 2.0-2.5 g/kg Using current wt of 97kg  Fluid:  1000ml + UOP  EDUCATION NEEDS:   No education needs identified at this time  LOW Care Level  Romelle Starcherate Lyndsi Altic MS, RD, LDN [phone] Pager

## 2015-10-18 NOTE — Clinical Social Work Note (Signed)
Clinical Social Worker is continuing to work with medical team, Wellsite geologistmedical director, RNCM for progression of care. CSW is available for support. CSW will continue to follow.   Dede QuerySarah Jhonnie Aliano, MSW, LCSW Clinical Social Worker  [phone]

## 2015-10-18 NOTE — Progress Notes (Addendum)
[facility] [place] [facility] Medicine Progess Note  Name: Courtney [name] MRN: [id number] DOB: 02/11/1966    ADMISSION DATE:  30-Aug-2015   INITIAL PRESENTATION:  49 AAF who has been in medical facilities (hosp, LTAC, rehab) for 2 yrs following gastric bypass surgery with multiple complications. Now with chronic trach, ESRD, profound debilitation, severe sacral pressure ulcer. Was seemingly making progress and transferred to rehab facility approx one week prior to this admission. She was sent to [facility] ED with AMS and hypotension. Working dx of severe sepsis/septic shock due to infected sacral pressure ulcer. Since admission. Her course is been very complicated with numerous complications including septic shock and GI bleeding.    INTERVAL HISTORY: The patient had fevers overnight, she is also developed tachypnea and tachycardia. This morning she is awake, she cannot follow commands. She simply nods no to all questions, she looks extremely fatigued. When asked to wiggle her toes or grasp hands. She nods no, she does not be seem to be able to do it. She continues to have diarrhea.    MAJOR EVENTS/TEST RESULTS: Admission 02/07/14-05/07/14 Admission 07/21/14-09/06/14 Discharged to [facility]. Pt had palliative consult at that time, were asked to sign off by husband.  09/23 CT head: NAD 09/23 EEG: no epileptiform activity 09/23 PRBCs for Hgb 6.4 09/24 bedside debridement of sacral wound. Abscess drained 09/25 Off vasopressors. More alert. No distress. Worsening thrombocytopenia. Vanc DC'd 09/29  Dr. Sampson Goon (I.D) excused from the case by patient's husband. 10/02 MRI -multiple infarcts 10/03 tracheal bleeding- transfused platelets 10/03 hospitalist service excused from the case by patient's husband 10/03 Echocardiogram ejection fraction was 55-60%, pulmonary systolic pressure was 39 mmHg 40/98 restart TF's at lower rate, attempt reg HD  10/12 Transferred to med-surg floor. Remains on PCCM  service 10/14 SLP eval: pt unable to tolerate PMV adequately 10/17-will re-attempt PM valve-discussed with Speech therapist 10/18 passed swallow eval-start pureed thick foods no thin liquids-continue NG feeds 10/19 transferred to step down for sepsis/aspiration pneumonia 10/19 cxr shows RLL opacity 10/21 sacral decub debride at bedside by surgery 10/22 started back on vasopressors while on HD 10/27 CT with osteo, R hip fx, unable to identify tip of dubhoff tube - sent for fluoro study 10/28 Ortho consultation: I do not feel that she is a surgical candidate. Therefore, I feel that it would best to manage this fracture nonsurgically and allow it to heal by itself over time, which it should.  10/28 Gen Surg consultation: Due to the lack of free air or free flow of contrast and the peritoneum there is no indication for any surgical intervention on this. Would recommend pulling back the feeding tube 1-2 cm. Would recheck an abdominal film to confirm no pneumoperitoneum in the morning. Absence of any changes okay to continue using Dobbhoff for feeding and medications. 10/28 gastrograffin study: The study confirms that the feeding tube pes perforated through the duodenum and the tip is within a cavity that fills with injected contrast. The cavity does appear walled off 11/4 refuses oral feeds, continue TF's CT reviewed: T8-T9 discitis/osteomyelitis, RT HIP FRACTURE 11/5 placed back on vasopressors, placed back on Vent due to resp acidosis, levophed turned off 11/6 afternoon 11/5 PM Trach changed out due to cuff leak, #6 Shiley cuffed, 11/6 dubhoff occluded, removed and replaced, new dubhoff shows tube in the antral stomach 11/15 refusing to take oral feeds 11/18 placed back on Vent for increased WOB,SOB. 11/28 Wound care as re -consulted, there appears to be a new pocket/fistula around the  area of her stage IV sacral wound. 10/18/2015; the patient continues to be vent dependent, she is tachypneic,  tachycardic and febrile, likely due to recurrent sepsis.    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 Pneumoniae, P. Mirabilis, VRE Wound 9/26 >> No growth Blood 9/23 >> NEG CDiff 9/27>>neg Stool Cx 10/15>> negative Cdiff 10/25>>neg Trach Aspirate 10/28>> light growth pseudomonas Blood 10/28 >> 1/2 GPC >>  Sputum cultures obtained 09/22/15 due to mucus plugging>>Pseudomonas BONE TISSUE Cx 11/3>>E.coli (ESBL), k. Pneumonia (daptomycin and septra) Bld Cx 11/27>>negative thus far.  Trach Asp 11/27>>  ANTIMICROBIALS:  Aztreonam 9/23 >> 9/24 Vanc 9/23 >> 9/25 Vanc 9/26>>9/27 Daptomycin 9/27>> 10/12, 10/28>> off Meropenem 9/23 >> 10/14, 10/28 >> 10/31 Septra 11/8>> Zosyn 11/27>>off. Zosyn 11/29, restarted.  VITAL SIGNS: Temp:  [98.6 F (37 C)-103.4 F (39.7 C)] 103.4 F (39.7 C) (11/29 0800) Pulse Rate:  [37-119] 39 (11/29 0800) Resp:  [27-41] 39 (11/29 0800) BP: (83-132)/(45-91) 116/54 mmHg (11/29 0800) SpO2:  [84 %-100 %] 100 % (11/29 0800) FiO2 (%):  [28 %-35 %] 35 % (11/29 0827) HEMODYNAMICS:   VENTILATOR SETTINGS: Vent Mode:  [-] PRVC FiO2 (%):  [28 %-35 %] 35 % Set Rate:  [14 bmp-16 bmp] 16 bmp Vt Set:  [500 mL] 500 mL PEEP:  [5 cmH20] 5 cmH20 INTAKE / OUTPUT:  Intake/Output Summary (Last 24 hours) at 10/18/15 0936 Last data filed at 10/18/15 0800  Gross per 24 hour  Intake 820.83 ml  Output    950 ml  Net -129.17 ml   PHYSICAL EXAMINATION: Physical Examination:   VS: BP 116/54 mmHg  Pulse 39  Temp(Src) 103.4 F (39.7 C) (Oral)  Resp 39  Ht 5\' 9"  (1.753 m)  Wt 97 kg (213 lb 13.5 oz)  BMI 31.57  kg/m2  SpO2 100%  LMP  (LMP Unknown)  General Appearance: No resp distress, coarse upper airway sounds, on vent Neuro: profoundly diffusely weak, alert today, nodding head HEENT: cushingoid facies, PERRLA, EOM intact Neck: trach site clean Pulmonary: clear anteriorly Cardiovascular: reg, + syst murmur Abdomen: soft, NT, +BS Extremities: warm, R>L UE edema    LABORATORY PANEL:   CBC  Recent Labs Lab 10/18/15 0640  WBC 11.3*  HGB 6.6*  HCT 21.8*  PLT 151    Chemistries   Recent Labs Lab 10/17/15 1545 10/18/15 0640  NA 144 143  K 4.7 4.1  CL 101 102  CO2 33* 32  GLUCOSE 153* 126*  BUN 93* 57*  CREATININE 1.52* 1.05*  CALCIUM 8.9 8.5*  PHOS 3.2  --        IMPRESSION/PLAN:  Recurrent sepsis, due to pneumonia versus decubitus ulcer, versus line sepsis. Repeat blood cultures are negative. Repeat sputum cultures are pending. I have restarted the patient on Zosyn. She has new anemia and I have ordered one unit of blood.  Anemia with macrocytosis. -Uncertain if this is due to chronic slow GI bleed or simple critical care associated anemia. She is artery receiving folate, thiamine, and multivitamin. Her anemia has not responded to oral medications, this may be due to her GI malabsorption.  Fever with sepsis. -Chest x-ray shows new left lower lobe infiltrate. In the  buttocks have been increased, sputum cultures are pending. The patient is once again, vent dependent. She has developed again recurrent sepsis due to her multiple possible sources.  Worsening sacral decubitus ulcer, stage IV, now with new fistulous tract area. -The patient is already had a sacral bone scraping, and has been placed on Septra for antibiotic, per ID recommendations. Her sacral decubitus does not appear to be improving and in fact appears to be worsening. In addition, she has several new smaller areas of tissue breakdown.  End-stage renal disease. -Continue on hemodialysis per  nephrology.  Diarrhea. -We'll discuss with nutrition regarding change tube feeds to previous formula. Which her husband feels did not cause diarrhea. We will increase loperamide 2 twice daily. The patient has developed GI bleed in the past with a rectal tube due to rectal ulcer.  This patient is now developing predictable and recurrent problems, her prognosis is very poor and it is virtually impossible at this point that she can achieve any foreseeable recovery given her altered medical medical issues as well as her very severe debility and deconditioning. I think have approached the point of medical futility.  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 Diff  Acute on chronic anemia Thrombocytopenia, resolved - HIT panel negative 9/25 Recurrent Severe sepsis, due to sacral pressure ulcer ID following  DM 2, controlled.  Chronic steroid therapy, for a history of of adrenal insufficiency Incidental finding of R hip fx - conservative mgmt. Waxing and waning encephalopathy - she was evaluated by both the psychiatry and neurology services, and it was deemed that the patient lacks decisional capacity at this time. 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 Husband declined TEE Profound deconditioning-criticall illness neuropathy Chronic pain  Cont vent support - settings reviewed and/or adjusted Wean in PSV <> ATC as tolerated Cont vent bundle Daily SBT if/when meets criteria Monitor BMET intermittently Monitor I/Os Correct electrolytes as indicated Intermittent HD per Renal service Cont TFs as tolerated Cont SUP Cont DVT px Cont TMP-SMX per ID service DVT px: SQ heparin Monitor CBC intermittently Transfuse per usual ICU guidelines Cont analgesics PRN Cont to work on [employer] placement   Husband updated at phone number [phone]. Discussed the patient's new onset of sepsis with tachypnea, tachycardia and fever.  Explained that we will restart antibiotics to treat the patient's new onset of sepsis. Explained the wound care nurses findings regarding worsening of the sacral decubitus ulcer, Mr. Bennye Alm feels that the ulcer is unchanged and wonders why the Dakin's wrap was not continued. Explained that the wound care nurse felt that the wound was worse compared to her last evaluation, and that Dakin's is a non-permanent treatment of the ulcer. I also expressed the husband that we are developing recurrent sepsis, I feel that the patient is not improving, she is extremely debilitated and deconditioned and barely able to lift the fingers on her left arm. Mr. Bennye Alm felt that she is not moving her left arm or moving her toes, as she is annoyed with all the staff members who keep coming in to do assessments on her. I relayed to him that I feel that we are developing recurrent problems that are never going to get better. He was in agreement with this, and feels that there may actually be a new issue.   Wells Guiles M.D.   Critical Care Attestation.  I have personally obtained a history, examined the patient, evaluated laboratory and imaging  results, formulated the assessment and plan and placed orders. The case was discussed with the critical care RN, nutrition, respiratory therapist, Associate Professor. The Patient requires high complexity decision making for assessment and support, frequent evaluation and titration of therapies, application of advanced monitoring technologies and extensive interpretation of multiple databases. The patient has critical illness that could lead imminently to failure of 1 or more organ systems and requires the highest level of physician preparedness to intervene.  Critical Care Time devoted to patient care services described in this note is 35 minutes and is exclusive of time spent in procedures.

## 2015-10-18 NOTE — Progress Notes (Signed)
ANTIBIOTIC CONSULT NOTE - INITIAL  Pharmacy Consult for Levaquin  Indication: pneumonia  Allergies  Allergen Reactions  . Contrast Media [Iodinated Diagnostic Agents] Anaphylaxis  . Ampicillin Rash    Patient Measurements: Height:  (175.3 cm) Weight:  (not able to weigh due to bed ) IBW/kg (Calculated) : 66.2  Vital Signs: Temp: 103.1 F (39.5 C) (11/29 1002) Temp Source: Oral (11/29 1002) BP: 122/73 mmHg (11/29 1000) Pulse Rate: 111 (11/29 1000) Intake/Output from previous day: 11/28 0701 - 11/29 0700 In: 808.3 [I.V.:30; NG/GT:778.3] Out: 950  Intake/Output from this shift: Total I/O In: 120 [NG/GT:120] Out: -   Labs:  Recent Labs  10/16/15 0625 10/17/15 1545 10/18/15 0640  WBC 11.6* 13.5* 11.3*  HGB 7.4* 7.3* 6.6*  PLT 169 166 151  CREATININE 1.22* 1.52* 1.05*   Estimated Creatinine Clearance: 79.4 mL/min (by C-G formula based on Cr of 1.05). No results for input(s): VANCOTROUGH, VANCOPEAK, VANCORANDOM, GENTTROUGH, GENTPEAK, GENTRANDOM, TOBRATROUGH, TOBRAPEAK, TOBRARND, AMIKACINPEAK, AMIKACINTROU, AMIKACIN in the last 72 hours.   Microbiology: Recent Results (from the past 720 hour(s))  Culture, expectorated sputum-assessment     Status: None   Collection Time: 09/22/15  2:09 PM  Result Value Ref Range Status   Specimen Description ENDOTRACHEAL  Final   Special Requests Normal  Final   Sputum evaluation THIS SPECIMEN IS ACCEPTABLE FOR SPUTUM CULTURE  Final   Report Status 09/24/2015 FINAL  Final  Culture, respiratory (NON-Expectorated)     Status: None   Collection Time: 09/22/15  2:09 PM  Result Value Ref Range Status   Specimen Description ENDOTRACHEAL  Final   Special Requests Normal Reflexed from [id number]  Final   Gram Stain   Final    FEW WBC SEEN FEW GRAM NEGATIVE RODS POOR SPECIMEN - LESS THAN 70% WBCS    Culture   Final    MODERATE GROWTH PSEUDOMONAS AERUGINOSA LIGHT GROWTH KLEBSIELLA PNEUMONIAE REFER TO SENSITIVITIES FROM PREVIOUS  CULTURE FOR ORG 2    Report Status 10/01/2015 FINAL  Final   Organism ID, Bacteria PSEUDOMONAS AERUGINOSA  Final   Organism ID, Bacteria KLEBSIELLA PNEUMONIAE  Final      Susceptibility   Pseudomonas aeruginosa - MIC*    CEFTAZIDIME 4 SENSITIVE Sensitive     CIPROFLOXACIN 2 INTERMEDIATE Intermediate     GENTAMICIN >=16 RESISTANT Resistant     IMIPENEM >=16 RESISTANT Resistant     PIP/TAZO Value in next row Sensitive      SENSITIVE16    * MODERATE GROWTH PSEUDOMONAS AERUGINOSA  Tissue culture     Status: None   Collection Time: 09/24/15  6:44 AM  Result Value Ref Range Status   Specimen Description BONE  Final   Special Requests Normal  Final   Gram Stain MODERATE WBC SEEN FEW GRAM NEGATIVE RODS   Final   Culture   Final    MODERATE GROWTH ESCHERICHIA COLI MODERATE GROWTH KLEBSIELLA PNEUMONIAE ESBL-EXTENDED SPECTRUM BETA LACTAMASE-THE ORGANISM IS RESISTANT TO PENICILLINS, CEPHALOSPORINS AND AZTREONAM ACCORDING TO CLSI [id number] VOL.25 N01 JAN 2005. ORGANISM 1 This organism isolate is resistant to one or more antiotic agents in three or more antimicrobial categories.  Suggest Infectious Disease consult.   ORGANISM 2 CRITICAL RESULT CALLED TO, READ BACK BY AND VERIFIED WITH: RN Mardene Celeste LINDSAY 09/27/15 1005AM    Report Status 09/28/2015 FINAL  Final   Organism ID, Bacteria ESCHERICHIA COLI  Final   Organism ID, Bacteria KLEBSIELLA PNEUMONIAE  Final      Susceptibility   Escherichia coli -  MIC*    AMPICILLIN >=32 RESISTANT Resistant     CEFTAZIDIME 4 RESISTANT Resistant     CEFAZOLIN >=64 RESISTANT Resistant     CEFTRIAXONE 8 RESISTANT Resistant     GENTAMICIN <=1 SENSITIVE Sensitive     IMIPENEM 8 RESISTANT Resistant     TRIMETH/SULFA <=20 SENSITIVE Sensitive     Extended ESBL POSITIVE Resistant     PIP/TAZO Value in next row Resistant      RESISTANT>=128    * MODERATE GROWTH ESCHERICHIA COLI   Klebsiella pneumoniae - MIC*    AMPICILLIN Value in next row Resistant       RESISTANT>=128    CEFTAZIDIME Value in next row Resistant      RESISTANT>=128    CEFAZOLIN Value in next row Resistant      RESISTANT>=128    CEFTRIAXONE Value in next row Resistant      RESISTANT>=128    CIPROFLOXACIN Value in next row Resistant      RESISTANT>=128    GENTAMICIN Value in next row Resistant      RESISTANT>=128    IMIPENEM Value in next row Resistant      RESISTANT>=128    TRIMETH/SULFA Value in next row Sensitive      RESISTANT>=128    PIP/TAZO Value in next row Resistant      RESISTANT>=128    * MODERATE GROWTH KLEBSIELLA PNEUMONIAE  Anaerobic culture     Status: None   Collection Time: 09/24/15  3:55 PM  Result Value Ref Range Status   Specimen Description BONE  Final   Special Requests Normal  Final   Culture NO ANAEROBES ISOLATED  Final   Report Status 09/29/2015 FINAL  Final  C difficile quick scan w PCR reflex     Status: None   Collection Time: 10/07/15  2:02 PM  Result Value Ref Range Status   C Diff antigen NEGATIVE NEGATIVE Final   C Diff toxin NEGATIVE NEGATIVE Final   C Diff interpretation Negative for C. difficile  Final  Culture, blood (routine x 2)     Status: None (Preliminary result)   Collection Time: 10/16/15  9:52 AM  Result Value Ref Range Status   Specimen Description BLOOD LEFT HAND  Final   Special Requests   Final    BOTTLES DRAWN AEROBIC AND ANAEROBIC  AER 6CC ANA 4CC   Culture NO GROWTH < 24 HOURS  Final   Report Status PENDING  Incomplete  Culture, blood (routine x 2)     Status: None (Preliminary result)   Collection Time: 10/16/15 12:25 PM  Result Value Ref Range Status   Specimen Description BLOOD LEFT HAND  Final   Special Requests BOTTLES DRAWN AEROBIC AND ANAEROBIC  5CC  Final   Culture NO GROWTH < 24 HOURS  Final   Report Status PENDING  Incomplete    Medical History: Past Medical History  Diagnosis Date  . Obesity   . Dyslipidemia   . Hypertension   . Coronary artery disease     s/p BMS 2010 LAD  .  Dysrhythmia     ventricular tachycardia resolved after LAD stent and beta blocker  . Diabetes mellitus     with retinopathy, neuropathy and microalbuminemia  . ESRD (end stage renal disease) on dialysis    Assessment: 49 yo female being treated for HCAP. Pharmacy consulted for dosing and monitoring of levofloxacin. Patient receives HD M,W,F  Plan:  Will start patient on  levofloxacin today and then  every 48 hours.  Will time dose for 1800, so that it follows her HD.  Pharmacy will continue to monitor labs and renal function and make adjustments as needed.   Cher NakaiSheema Marlina Cataldi, PharmD Pharmacy Resident 10/18/2015.

## 2015-10-18 NOTE — Plan of Care (Signed)
Problem: Phase I Progression Outcomes Goal: Progress activity as tolerated unless otherwise ordered Outcome: Not Progressing PROM and frequent turning in bed. Pt extremely weak. Goal: Discharge plan established Outcome: Not Progressing Care [facility] Goal: Tolerating diet Outcome: Progressing Tolerating TF. TF changed to Jevity 1.2 at 3750ml/hr. Loose light brownish yellow stools of very large amount. Test for C-diff negative today. Lomotil given x2 today.  Problem: Phase II Progression Outcomes Goal: Pain controlled on oral analgesia Outcome: Progressing Oxycodone and dilaudid prn given for pain with relief Goal: Dyspnea controlled w/progressive activity Outcome: Progressing Tachypneic and slightly labored with linen change and dressing changes Goal: ADLs completed with minimal assistance Outcome: Not Progressing Total assist with ADL's Goal: Other Phase II Outcomes/Goals Outcome: Progressing Pt anemia required 1 unit PRBC's today. No evidence of bleeding.  Wound care consult was done for sacral wound.  Changed to wet to dry Dakins solution.

## 2015-10-18 NOTE — Consult Note (Signed)
WOC wound consult note Reason for Consult: Wound type: Pressure Ulcer POA: Yes/No Measurement:Measurement:Sacrum: 20 cm x 15 cm x 4 cm with palpable bone in wound bed. 50% pale pink tissue and 10% muscle/tendon. Wound has now developed adherent yellow slough in wound bed. Undermining  still present from 8 to 12 o'clock, extending 1 cm.  Right lateral foot 100% slough with detaching eschar. Some autolysis occuring. Right trochanter 3.6 cm x 1 cm x 0.1 cm 10% devitalized tissue 90% pale pink tissue.  Patient continues to have increasing loose stools.  Drainage (amount, consistency, odor) Moderate serosanguinous drainage.  Musty odor.  Periwound:Intact Dressing procedure/placement/frequency:Cleanse right foot with NS and apply silicone foam dressing.Cahgne every 3 days.  Cleanse right hip with NS and pat gently dry. Apply Allevyn silicone border foam. Change every 3 days and PRn soilage Cleanse sacral ulcer with NS and pat gently dry. Gently fill wound bed with Dakin's moistened gauze. Cover with 4x4 gauze and ABD pad. Change each shift and PRN soilage.  Will not follow at this time.  Please re-consult if needed.  Maple HudsonKaren Jora Galluzzo RN BSN [facility]ON Pager [phone]

## 2015-10-18 NOTE — Progress Notes (Signed)
Subjective:  Fever noted again over night of 100.6.  Tachypneic again this AM.  Was having loose stools yesterday.   Objective:  Vital signs in last 24 hours:  Temp:  [98.4 F (36.9 C)-100.6 F (38.1 C)] 100.6 F (38.1 C) (11/28 2030) Pulse Rate:  [41-119] 112 (11/28 2030) Resp:  [19-38] 34 (11/28 2030) BP: (86-132)/(51-91) 110/71 mmHg (11/28 2030) SpO2:  [84 %-100 %] 100 % (11/28 2030) FiO2 (%):  [28 %-35 %] 35 % (11/29 0546)  Weight change:  Filed Weights    Intake/Output: I/O last 3 completed shifts: In: 858.3 [I.V.:30; NG/GT:828.3] Out: -    Intake/Output this shift:  Total I/O In: 10 [I.V.:10] Out: 950 [Other:950]  Physical Exam: General: No acute distress, chronically ill appearing  Head: NG in place, OM moist  Eyes: anicteric  Neck: Tracheostomy in place  Lungs:  Scattered rhonchi, vent assisted fio2 28%, tachypneic  Heart: S1S2 +tachycardia  Abdomen:  Soft, nontender  Extremities: 2+ dependent edema, anasarca  Neurologic: Lethargic but arousable  Access:  R IJ permcath.       Basic Metabolic Panel:  Recent Labs Lab 10/12/15 0636 10/14/15 0514 10/15/15 1223 10/16/15 0625 10/17/15 1545  NA 142 145  --  144 144  K 4.7 4.2  --  4.2 4.7  CL 101 105  --  102 101  CO2 32 32  --  32 33*  GLUCOSE 136* 109*  --  166* 153*  BUN 84* 74*  --  66* 93*  CREATININE 1.42* 1.20*  --  1.22* 1.52*  CALCIUM 8.5* 8.5*  --  8.7* 8.9  PHOS 5.1*  --  1.9* 2.2* 3.2    Liver Function Tests:  Recent Labs Lab 10/12/15 0636 10/17/15 1545  ALBUMIN 2.2* 2.2*   No results for input(s): LIPASE, AMYLASE in the last 168 hours. No results for input(s): AMMONIA in the last 168 hours.  CBC:  Recent Labs Lab 10/12/15 0636 10/14/15 0514 10/16/15 0625 10/17/15 1545  WBC 8.7 12.3* 11.6* 13.5*  NEUTROABS  --  10.9* 9.9*  --   HGB 8.0* 7.5* 7.4* 7.3*  HCT 25.9* 25.0* 24.6* 24.6*  MCV 102.5* 101.6* 101.1* 101.5*  PLT 190 183 169 166    Cardiac Enzymes: No  results for input(s): CKTOTAL, CKMB, CKMBINDEX, TROPONINI in the last 168 hours.  BNP: Invalid input(s): POCBNP  CBG:  Recent Labs Lab 10/17/15 1216 10/17/15 1621 10/17/15 2007 10/17/15 2343 10/18/15 0353  GLUCAP 155* 141* 112* 129* 124*    Microbiology: Results for orders placed or performed during the hospital encounter of 08/16/2015  Blood Culture (routine x 2)     Status: None   Collection Time: 08/02/2015  8:51 AM  Result Value Ref Range Status   Specimen Description BLOOD [name]  Final   Special Requests BOTTLES DRAWN AEROBIC AND ANAEROBIC  3CC  Final   Culture NO GROWTH 5 DAYS  Final   Report Status 08/17/2015 FINAL  Final  Blood Culture (routine x 2)     Status: None   Collection Time: 07/22/2015  9:20 AM  Result Value Ref Range Status   Specimen Description BLOOD LEFT ARM  Final   Special Requests BOTTLES DRAWN AEROBIC AND ANAEROBIC  1CC  Final   Culture NO GROWTH 5 DAYS  Final   Report Status 08/17/2015 FINAL  Final  Wound culture     Status: None   Collection Time: 08/01/2015  9:20 AM  Result Value Ref Range Status   Specimen Description DECUBITIS  Final   Special Requests Normal  Final   Gram Stain   Final    FEW WBC SEEN MANY GRAM NEGATIVE RODS RARE GRAM POSITIVE COCCI    Culture   Final    HEAVY GROWTH ESCHERICHIA COLI MODERATE GROWTH ENTEROBACTER AEROGENES PROTEUS MIRABILIS HEAVY GROWTH ENTEROCOCCUS SPECIES VRE HAVE INTRINSIC RESISTANCE TO MOST COMMONLY USED ANTIBIOTICS AND THE ABILITY TO ACQUIRE RESISTANCE TO MOST AVAILABLE ANTIBIOTICS.    Report Status 08/16/2015 FINAL  Final   Organism ID, Bacteria ESCHERICHIA COLI  Final   Organism ID, Bacteria ENTEROBACTER AEROGENES  Final   Organism ID, Bacteria PROTEUS MIRABILIS  Final   Organism ID, Bacteria ENTEROCOCCUS SPECIES  Final      Susceptibility   Enterobacter aerogenes - MIC*    CEFTAZIDIME <=1 SENSITIVE Sensitive     CEFAZOLIN >=64 RESISTANT Resistant     CEFTRIAXONE <=1 SENSITIVE Sensitive      CIPROFLOXACIN <=0.25 SENSITIVE Sensitive     GENTAMICIN <=1 SENSITIVE Sensitive     IMIPENEM 1 SENSITIVE Sensitive     TRIMETH/SULFA <=20 SENSITIVE Sensitive     * MODERATE GROWTH ENTEROBACTER AEROGENES   Escherichia coli - MIC*    AMPICILLIN <=2 SENSITIVE Sensitive     CEFTAZIDIME <=1 SENSITIVE Sensitive     CEFAZOLIN <=4 SENSITIVE Sensitive     CEFTRIAXONE <=1 SENSITIVE Sensitive     CIPROFLOXACIN <=0.25 SENSITIVE Sensitive     GENTAMICIN <=1 SENSITIVE Sensitive     IMIPENEM <=0.25 SENSITIVE Sensitive     TRIMETH/SULFA <=20 SENSITIVE Sensitive     * HEAVY GROWTH ESCHERICHIA COLI   Proteus mirabilis - MIC*    AMPICILLIN >=32 RESISTANT Resistant     CEFTAZIDIME <=1 SENSITIVE Sensitive     CEFAZOLIN 8 SENSITIVE Sensitive     CEFTRIAXONE <=1 SENSITIVE Sensitive     CIPROFLOXACIN <=0.25 SENSITIVE Sensitive     GENTAMICIN <=1 SENSITIVE Sensitive     IMIPENEM 1 SENSITIVE Sensitive     TRIMETH/SULFA <=20 SENSITIVE Sensitive     * PROTEUS MIRABILIS   Enterococcus species - MIC*    AMPICILLIN >=32 RESISTANT Resistant     VANCOMYCIN >=32 RESISTANT Resistant     GENTAMICIN SYNERGY SENSITIVE Sensitive     TETRACYCLINE Value in next row Resistant      RESISTANT>=16    * HEAVY GROWTH ENTEROCOCCUS SPECIES  MRSA PCR Screening     Status: None   Collection Time: 08-23-15  2:38 PM  Result Value Ref Range Status   MRSA by PCR NEGATIVE NEGATIVE Final    Comment:        The GeneXpert MRSA Assay (FDA approved for NASAL specimens only), is one component of a comprehensive MRSA colonization surveillance program. It is not intended to diagnose MRSA infection nor to guide or monitor treatment for MRSA infections.   Blood culture (single)     Status: None   Collection Time: 08-23-15  3:36 PM  Result Value Ref Range Status   Specimen Description BLOOD RIGHT ASSIST CONTROL  Final   Special Requests BOTTLES DRAWN AEROBIC AND ANAEROBIC  Final   Culture NO GROWTH 5 DAYS  Final   Report  Status 08/17/2015 FINAL  Final  Wound culture     Status: None   Collection Time: 08/13/15 12:37 PM  Result Value Ref Range Status   Specimen Description WOUND  Final   Special Requests Normal  Final   Gram Stain   Final    FEW WBC SEEN TOO NUMEROUS TO COUNT GRAM  NEGATIVE RODS FEW GRAM POSITIVE COCCI    Culture   Final    HEAVY GROWTH ESCHERICHIA COLI MODERATE GROWTH PROTEUS MIRABILIS LIGHT GROWTH KLEBSIELLA PNEUMONIAE MODERATE GROWTH ENTEROCOCCUS GALLINARUM CRITICAL RESULT CALLED TO, READ BACK BY AND VERIFIED WITH: [facility] BORBA AT 1042 08/16/15 DV    Report Status 08/17/2015 FINAL  Final   Organism ID, Bacteria ESCHERICHIA COLI  Final   Organism ID, Bacteria PROTEUS MIRABILIS  Final   Organism ID, Bacteria KLEBSIELLA PNEUMONIAE  Final   Organism ID, Bacteria ENTEROCOCCUS GALLINARUM  Final      Susceptibility   Escherichia coli - MIC*    AMPICILLIN >=32 RESISTANT Resistant     CEFTAZIDIME 4 RESISTANT Resistant     CEFAZOLIN >=64 RESISTANT Resistant     CEFTRIAXONE 16 RESISTANT Resistant     GENTAMICIN 2 SENSITIVE Sensitive     IMIPENEM >=16 RESISTANT Resistant     TRIMETH/SULFA <=20 SENSITIVE Sensitive     Extended ESBL POSITIVE Resistant     PIP/TAZO Value in next row Resistant      RESISTANT>=128    CIPROFLOXACIN Value in next row Sensitive      SENSITIVE<=0.25    * HEAVY GROWTH ESCHERICHIA COLI   Klebsiella pneumoniae - MIC*    AMPICILLIN Value in next row Resistant      SENSITIVE<=0.25    CEFTAZIDIME Value in next row Resistant      SENSITIVE<=0.25    CEFAZOLIN Value in next row Resistant      SENSITIVE<=0.25    CEFTRIAXONE Value in next row Resistant      SENSITIVE<=0.25    CIPROFLOXACIN Value in next row Resistant      SENSITIVE<=0.25    GENTAMICIN Value in next row Sensitive      SENSITIVE<=0.25    IMIPENEM Value in next row Resistant      SENSITIVE<=0.25    TRIMETH/SULFA Value in next row Resistant      SENSITIVE<=0.25    PIP/TAZO Value in next row  Resistant      RESISTANT>=128    * LIGHT GROWTH KLEBSIELLA PNEUMONIAE   Proteus mirabilis - MIC*    AMPICILLIN Value in next row Resistant      RESISTANT>=128    CEFTAZIDIME Value in next row Sensitive      RESISTANT>=128    CEFAZOLIN Value in next row Sensitive      RESISTANT>=128    CEFTRIAXONE Value in next row Sensitive      RESISTANT>=128    CIPROFLOXACIN Value in next row Sensitive      RESISTANT>=128    GENTAMICIN Value in next row Sensitive      RESISTANT>=128    IMIPENEM Value in next row Sensitive      RESISTANT>=128    TRIMETH/SULFA Value in next row Sensitive      RESISTANT>=128    PIP/TAZO Value in next row Sensitive      SENSITIVE<=4    * MODERATE GROWTH PROTEUS MIRABILIS   Enterococcus gallinarum - MIC*    AMPICILLIN Value in next row Resistant      SENSITIVE<=4    GENTAMICIN SYNERGY Value in next row Sensitive      SENSITIVE<=4    CIPROFLOXACIN Value in next row Resistant      RESISTANT>=8    TETRACYCLINE Value in next row Resistant      RESISTANT>=16    * MODERATE GROWTH ENTEROCOCCUS GALLINARUM  Wound culture     Status: None   Collection Time: 08/15/15  2:53 PM  Result  Value Ref Range Status   Specimen Description WOUND  Final   Special Requests NONE  Final   Gram Stain FEW WBC SEEN NO ORGANISMS SEEN   Final   Culture NO GROWTH 3 DAYS  Final   Report Status 08/18/2015 FINAL  Final  C difficile quick scan w PCR reflex     Status: None   Collection Time: 08/17/15 11:34 AM  Result Value Ref Range Status   C Diff antigen NEGATIVE NEGATIVE Final   C Diff toxin NEGATIVE NEGATIVE Final   C Diff interpretation Negative for C. difficile  Final  Stool culture     Status: None   Collection Time: 09/03/15  3:51 PM  Result Value Ref Range Status   Specimen Description STOOL  Final   Special Requests Immunocompromised  Final   Culture   Final    NO SALMONELLA OR SHIGELLA ISOLATED No Pathogenic E. coli detected NO CAMPYLOBACTER DETECTED    Report Status  09/07/2015 FINAL  Final  C difficile quick scan w PCR reflex     Status: None   Collection Time: 09/13/15 12:51 AM  Result Value Ref Range Status   C Diff antigen NEGATIVE NEGATIVE Final   C Diff toxin NEGATIVE NEGATIVE Final   C Diff interpretation Negative for C. difficile  Final  Culture, blood (routine x 2)     Status: None   Collection Time: 09/16/15 11:24 AM  Result Value Ref Range Status   Specimen Description BLOOD LEFT HAND  Final   Special Requests BOTTLES DRAWN AEROBIC AND ANAEROBIC  1CC  Final   Culture NO GROWTH 5 DAYS  Final   Report Status 09/21/2015 FINAL  Final  Culture, blood (routine x 2)     Status: None   Collection Time: 09/16/15 12:23 PM  Result Value Ref Range Status   Specimen Description BLOOD RIGHT HAND  Final   Special Requests BOTTLES DRAWN AEROBIC AND ANAEROBIC  1CC  Final   Culture  Setup Time   Final    GRAM POSITIVE COCCI AEROBIC BOTTLE ONLY CRITICAL RESULT CALLED TO, READ BACK BY AND VERIFIED WITH: TESS THOMAS,RN 09/17/2015 0631 BY JRS.    Culture   Final    ENTEROCOCCUS FAECALIS AEROBIC BOTTLE ONLY VRE HAVE INTRINSIC RESISTANCE TO MOST COMMONLY USED ANTIBIOTICS AND THE ABILITY TO ACQUIRE RESISTANCE TO MOST AVAILABLE ANTIBIOTICS. CRITICAL RESULT CALLED TO, READ BACK BY AND VERIFIED WITH: CHERYL SMITH AT 9147 09/19/15 DV    Report Status 09/21/2015 FINAL  Final   Organism ID, Bacteria ENTEROCOCCUS FAECALIS  Final      Susceptibility   Enterococcus faecalis - MIC*    AMPICILLIN <=2 SENSITIVE Sensitive     LINEZOLID 2 SENSITIVE Sensitive     CIPROFLOXACIN Value in next row Resistant      RESISTANT>=8    TETRACYCLINE Value in next row Resistant      RESISTANT>=16    VANCOMYCIN Value in next row Resistant      RESISTANT>=32    GENTAMICIN SYNERGY Value in next row Resistant      RESISTANT>=32    * ENTEROCOCCUS FAECALIS  Culture, respiratory (NON-Expectorated)     Status: None   Collection Time: 09/16/15  3:50 PM  Result Value Ref Range Status    Specimen Description TRACHEAL ASPIRATE  Final   Special Requests Immunocompromised  Final   Gram Stain   Final    FEW WBC SEEN GOOD SPECIMEN - 80-90% WBCS RARE GRAM NEGATIVE RODS    Culture LIGHT  GROWTH PSEUDOMONAS AERUGINOSA  Final   Report Status 09/19/2015 FINAL  Final   Organism ID, Bacteria PSEUDOMONAS AERUGINOSA  Final      Susceptibility   Pseudomonas aeruginosa - MIC*    CEFTAZIDIME 8 SENSITIVE Sensitive     CIPROFLOXACIN 2 INTERMEDIATE Intermediate     GENTAMICIN >=16 RESISTANT Resistant     IMIPENEM >=16 RESISTANT Resistant     PIP/TAZO Value in next row Sensitive      SENSITIVE32    CEFEPIME Value in next row Sensitive      SENSITIVE8    LEVOFLOXACIN Value in next row Resistant      RESISTANT>=8    * LIGHT GROWTH PSEUDOMONAS AERUGINOSA  Culture, expectorated sputum-assessment     Status: None   Collection Time: 09/22/15  2:09 PM  Result Value Ref Range Status   Specimen Description ENDOTRACHEAL  Final   Special Requests Normal  Final   Sputum evaluation THIS SPECIMEN IS ACCEPTABLE FOR SPUTUM CULTURE  Final   Report Status 09/24/2015 FINAL  Final  Culture, respiratory (NON-Expectorated)     Status: None   Collection Time: 09/22/15  2:09 PM  Result Value Ref Range Status   Specimen Description ENDOTRACHEAL  Final   Special Requests Normal Reflexed from [id number]  Final   Gram Stain   Final    FEW WBC SEEN FEW GRAM NEGATIVE RODS POOR SPECIMEN - LESS THAN 70% WBCS    Culture   Final    MODERATE GROWTH PSEUDOMONAS AERUGINOSA LIGHT GROWTH KLEBSIELLA PNEUMONIAE REFER TO SENSITIVITIES FROM PREVIOUS CULTURE FOR ORG 2    Report Status 10/01/2015 FINAL  Final   Organism ID, Bacteria PSEUDOMONAS AERUGINOSA  Final   Organism ID, Bacteria KLEBSIELLA PNEUMONIAE  Final      Susceptibility   Pseudomonas aeruginosa - MIC*    CEFTAZIDIME 4 SENSITIVE Sensitive     CIPROFLOXACIN 2 INTERMEDIATE Intermediate     GENTAMICIN >=16 RESISTANT Resistant     IMIPENEM >=16 RESISTANT  Resistant     PIP/TAZO Value in next row Sensitive      SENSITIVE16    * MODERATE GROWTH PSEUDOMONAS AERUGINOSA  Tissue culture     Status: None   Collection Time: 09/24/15  6:44 AM  Result Value Ref Range Status   Specimen Description BONE  Final   Special Requests Normal  Final   Gram Stain MODERATE WBC SEEN FEW GRAM NEGATIVE RODS   Final   Culture   Final    MODERATE GROWTH ESCHERICHIA COLI MODERATE GROWTH KLEBSIELLA PNEUMONIAE ESBL-EXTENDED SPECTRUM BETA LACTAMASE-THE ORGANISM IS RESISTANT TO PENICILLINS, CEPHALOSPORINS AND AZTREONAM ACCORDING TO CLSI [id number] VOL.25 N01 JAN 2005. ORGANISM 1 This organism isolate is resistant to one or more antiotic agents in three or more antimicrobial categories.  Suggest Infectious Disease consult.   ORGANISM 2 CRITICAL RESULT CALLED TO, READ BACK BY AND VERIFIED WITH: RN Mardene CelesteJOANNA LINDSAY 09/27/15 1005AM    Report Status 09/28/2015 FINAL  Final   Organism ID, Bacteria ESCHERICHIA COLI  Final   Organism ID, Bacteria KLEBSIELLA PNEUMONIAE  Final      Susceptibility   Escherichia coli - MIC*    AMPICILLIN >=32 RESISTANT Resistant     CEFTAZIDIME 4 RESISTANT Resistant     CEFAZOLIN >=64 RESISTANT Resistant     CEFTRIAXONE 8 RESISTANT Resistant     GENTAMICIN <=1 SENSITIVE Sensitive     IMIPENEM 8 RESISTANT Resistant     TRIMETH/SULFA <=20 SENSITIVE Sensitive     Extended ESBL POSITIVE Resistant  PIP/TAZO Value in next row Resistant      RESISTANT>=128    * MODERATE GROWTH ESCHERICHIA COLI   Klebsiella pneumoniae - MIC*    AMPICILLIN Value in next row Resistant      RESISTANT>=128    CEFTAZIDIME Value in next row Resistant      RESISTANT>=128    CEFAZOLIN Value in next row Resistant      RESISTANT>=128    CEFTRIAXONE Value in next row Resistant      RESISTANT>=128    CIPROFLOXACIN Value in next row Resistant      RESISTANT>=128    GENTAMICIN Value in next row Resistant      RESISTANT>=128    IMIPENEM Value in next row Resistant       RESISTANT>=128    TRIMETH/SULFA Value in next row Sensitive      RESISTANT>=128    PIP/TAZO Value in next row Resistant      RESISTANT>=128    * MODERATE GROWTH KLEBSIELLA PNEUMONIAE  Anaerobic culture     Status: None   Collection Time: 09/24/15  3:55 PM  Result Value Ref Range Status   Specimen Description BONE  Final   Special Requests Normal  Final   Culture NO ANAEROBES ISOLATED  Final   Report Status 09/29/2015 FINAL  Final  C difficile quick scan w PCR reflex     Status: None   Collection Time: 10/07/15  2:02 PM  Result Value Ref Range Status   C Diff antigen NEGATIVE NEGATIVE Final   C Diff toxin NEGATIVE NEGATIVE Final   C Diff interpretation Negative for C. difficile  Final  Culture, blood (routine x 2)     Status: None (Preliminary result)   Collection Time: 10/16/15  9:52 AM  Result Value Ref Range Status   Specimen Description BLOOD LEFT HAND  Final   Special Requests   Final    BOTTLES DRAWN AEROBIC AND ANAEROBIC  AER 6CC ANA 4CC   Culture NO GROWTH < 24 HOURS  Final   Report Status PENDING  Incomplete  Culture, blood (routine x 2)     Status: None (Preliminary result)   Collection Time: 10/16/15 12:25 PM  Result Value Ref Range Status   Specimen Description BLOOD LEFT HAND  Final   Special Requests BOTTLES DRAWN AEROBIC AND ANAEROBIC  5CC  Final   Culture NO GROWTH < 24 HOURS  Final   Report Status PENDING  Incomplete    Coagulation Studies: No results for input(s): LABPROT, INR in the last 72 hours.  Urinalysis: No results for input(s): COLORURINE, LABSPEC, PHURINE, GLUCOSEU, HGBUR, BILIRUBINUR, KETONESUR, PROTEINUR, UROBILINOGEN, NITRITE, LEUKOCYTESUR in the last 72 hours.  Invalid input(s): APPERANCEUR    Imaging: Dg Chest Port 1 View  10/16/2015  CLINICAL DATA:  Respiratory failure EXAM: PORTABLE CHEST 1 VIEW COMPARISON:  10/09/2015 FINDINGS: Tracheostomy tube and NG tube are unchanged. LEFT central venous line and RIGHT hemodialysis catheter  unchanged. Stable cardiac silhouette. There is new airspace disease in the LEFT lower lobe. Small bilateral pleural effusions. No pneumothorax. IMPRESSION: 1. Stable support apparatus. 2. New LEFT lower lobe airspace disease representing infiltrate or potentially aspiration pneumonitis. Asymmetric edema would also be considered. These results will be called to the ordering clinician or representative by the Radiologist Assistant, and communication documented in the PACS or zVision Dashboard. Electronically Signed   By: Genevive Bi M.D.   On: 10/16/2015 08:34     Medications:   . feeding supplement (VITAL AF 1.2 CAL) 1,000 mL (  10/17/15 1554)  . norepinephrine Stopped (10/08/15 0530)   . amantadine  200 mg Per Tube Q7 days  . antiseptic oral rinse  7 mL Mouth Rinse QID  . aspirin  81 mg Oral Daily  . chlorhexidine gluconate  15 mL Mouth Rinse BID  . epoetin (EPOGEN/PROCRIT) injection  10,000 Units Intravenous Q M,W,F-HD  . escitalopram  10 mg Oral Daily  . feeding supplement (PRO-STAT SUGAR FREE 64)  30 mL Oral 6 X Daily  . folic acid  1 mg Oral Daily  . free water  20 mL Per Tube 6 times per day  . heparin subcutaneous  5,000 Units Subcutaneous Q12H  . hydrocortisone  10 mg Per Tube BID  . insulin aspart  0-9 Units Subcutaneous 6 times per day  . ipratropium-albuterol  3 mL Nebulization QID  . lidocaine  1 patch Transdermal Q24H  . loperamide  2 mg Oral Daily  . midodrine  5 mg Per Tube TID WC  . multivitamin  5 mL Oral Daily  . pantoprazole sodium  40 mg Per Tube Daily  . saccharomyces boulardii  250 mg Oral BID  . sodium chloride  10-40 mL Intracatheter Q12H  . sulfamethoxazole-trimethoprim  2.5 tablet Per Tube q1800  . thiamine  100 mg Per Tube Daily   sodium chloride, sodium chloride, acetaminophen (TYLENOL) oral liquid 160 mg/5 mL, HYDROmorphone (DILAUDID) injection, morphine injection, ondansetron (ZOFRAN) IV, oxyCODONE, sodium chloride, zinc oxide  Assessment/ Plan:  49  y.o. 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, [facility]. Admitted on 09-Oct-2015  1. End-stage renal disease on hemodialysis on HD MWF. The patient has been on dialysis since October of 2014. R IJ permcath. Dialysis treatment with IV albumin for oncotic support.  - Patient had HD yesterday, no acute indication for HD today, will plan for HD again tomorrow.   2. Anemia of CKD: Hgb 7.3 yesterday, continue epogen with HD.   3. Secondary hyperparathyroidism: PTH low at 55 (9/25) - monitor phos off binders.   4. Sepsis: VRE in blood. Bone cultures E. Coli and klebsiella - Treatment as per ID and ICU team: daptomycin completed on 11/10 and now on trimethoprim/sulfamethoxazole.  -remains febrile, recent blood cultures negative, having loose stools, check C. Diff today.  5. Generalized Dependent edema/Anasarca due to 3rd spacing -  Continue ultrafiltration with hemodialysis as tolerated.  6. Acute resp failure - New infiltrate was noted in the left lower lobe on Xray. -abx management per pulm/cc and ID.   LOS: 67 Courtney [name] 11/29/20166:12 AM

## 2015-10-18 NOTE — Progress Notes (Signed)
PT Cancellation Note  Patient Details Name: Courtney [name] MRN: [id number] DOB: 1966/02/13   Cancelled Treatment:    Reason Eval/Treat Not Completed: Medical issues which prohibited therapy (Per chart review, patient noted with HgB below 7; physical activity contraindicated at this time.  Will hold therapy services this date and re-attempt as medically appropriate.)   Karam Dunson H. Manson PasseyBrown, PT, DPT, NCS 10/18/2015, 3:10 PM [phone]

## 2015-10-19 ENCOUNTER — Inpatient Hospital Stay: Payer: 59

## 2015-10-19 LAB — GLUCOSE, CAPILLARY
GLUCOSE-CAPILLARY: 162 mg/dL — AB (ref 65–99)
GLUCOSE-CAPILLARY: 156 mg/dL — AB (ref 65–99)
GLUCOSE-CAPILLARY: 141 mg/dL — AB (ref 65–99)
Glucose-Capillary: 120 mg/dL — ABNORMAL HIGH (ref 65–99)
Glucose-Capillary: 132 mg/dL — ABNORMAL HIGH (ref 65–99)
Glucose-Capillary: 181 mg/dL — ABNORMAL HIGH (ref 65–99)

## 2015-10-19 LAB — BASIC METABOLIC PANEL
ANION GAP: 9 (ref 5–15)
BUN: 76 mg/dL — AB (ref 6–20)
CHLORIDE: 100 mmol/L — AB (ref 101–111)
CO2: 33 mmol/L — ABNORMAL HIGH (ref 22–32)
Calcium: 8.5 mg/dL — ABNORMAL LOW (ref 8.9–10.3)
Creatinine, Ser: 1.27 mg/dL — ABNORMAL HIGH (ref 0.44–1.00)
GFR, EST AFRICAN AMERICAN: 56 mL/min — AB (ref >60)
GFR, EST NON AFRICAN AMERICAN: 48 mL/min — AB (ref >60)
Glucose, Bld: 183 mg/dL — ABNORMAL HIGH (ref 65–99)
POTASSIUM: 4.6 mmol/L (ref 3.5–5.1)
SODIUM: 142 mmol/L (ref 135–145)

## 2015-10-19 LAB — CBC
HCT: 24 % — ABNORMAL LOW (ref 35.0–47.0)
HEMOGLOBIN: 7.4 g/dL — AB (ref 12.0–16.0)
MCH: 29.8 pg (ref 26.0–34.0)
MCHC: 30.9 g/dL — ABNORMAL LOW (ref 32.0–36.0)
MCV: 96.6 fL (ref 80.0–100.0)
PLATELETS: 145 10*3/uL — AB (ref 150–440)
RBC: 2.48 MIL/uL — ABNORMAL LOW (ref 3.80–5.20)
RDW: 20.4 % — ABNORMAL HIGH (ref 11.5–14.5)
WBC: 12 10*3/uL — AB (ref 3.6–11.0)

## 2015-10-19 LAB — HEMOGLOBIN AND HEMATOCRIT, BLOOD
HEMATOCRIT: 22.5 % — AB (ref 35.0–47.0)
HEMOGLOBIN: 7 g/dL — AB (ref 12.0–16.0)

## 2015-10-19 MED ORDER — ALBUMIN HUMAN 25 % IV SOLN
25.0000 g | Freq: Once | INTRAVENOUS | Status: AC
  Administered 2015-10-19: 25 g via INTRAVENOUS
  Filled 2015-10-19: qty 100

## 2015-10-19 NOTE — Progress Notes (Signed)
Post HD Tx Assessment  

## 2015-10-19 NOTE — Progress Notes (Signed)
[facility] [place] Critical Care Medicine Progess Note  Name: Courtney [name] MRN: [id number] DOB: [birth year]    ADMISSION DATE:  07/24/2015   INITIAL PRESENTATION:  49 AAF who has been in medical facilities (hosp, LTAC, rehab) for 2 yrs following gastric bypass surgery with multiple complications. Now with chronic trach, ESRD, profound debilitation, severe sacral pressure ulcer. Was seemingly making progress and transferred to rehab facility approx one week prior to this admission. She was sent to [facility] ED with AMS and hypotension. Working dx of severe sepsis/septic shock due to infected sacral pressure ulcer. Since admission. Her course is been very complicated with numerous complications including septic shock and GI bleeding.    INTERVAL HISTORY: The patient had fevers overnight, she is also developed tachypnea and tachycardia. This morning she is awake, she cannot follow commands. She simply nods no to all questions, she looks extremely fatigued. When asked to wiggle her toes or grasp hands. She nods no, she does not be seem to be able to do it. She continues to have diarrhea after changing her tube feeds to Jevity, as requested by the patient and husband. Her fevers have resolved with IV Tylenol.    MAJOR EVENTS/TEST RESULTS: Admission 02/07/14-05/07/14 Admission 07/21/14-09/06/14 Discharged to [facility]. Pt had palliative consult at that time, were asked to sign off by husband.  09/23 CT head: NAD 09/23 EEG: no epileptiform activity 09/23 PRBCs for Hgb 6.4 09/24 bedside debridement of sacral wound. Abscess drained 09/25 Off vasopressors. More alert. No distress. Worsening thrombocytopenia. Vanc DC'd 09/29  Dr. Sampson Goon (I.D) excused from the case by patient's husband. 10/02 MRI -multiple infarcts 10/03 tracheal bleeding- transfused platelets 10/03 hospitalist service excused from the case by patient's husband 10/03 Echocardiogram ejection fraction was 55-60%, pulmonary systolic  pressure was 39 mmHg 10/08 restart TF's at lower rate, attempt reg HD  10/12 Transferred to med-surg floor. Remains on PCCM service 10/14 SLP eval: pt unable to tolerate PMV adequately 10/17-will re-attempt PM valve-discussed with Speech therapist 10/18 passed swallow eval-start pureed thick foods no thin liquids-continue NG feeds 10/19 transferred to step down for sepsis/aspiration pneumonia 10/19 cxr shows RLL opacity 10/21 sacral decub debride at bedside by surgery 10/22 started back on vasopressors while on HD 10/27 CT with osteo, R hip fx, unable to identify tip of dubhoff tube - sent for fluoro study 10/28 Ortho consultation: I do not feel that she is a surgical candidate. Therefore, I feel that it would best to manage this fracture nonsurgically and allow it to heal by itself over time, which it should.  10/28 Gen Surg consultation: Due to the lack of free air or free flow of contrast and the peritoneum there is no indication for any surgical intervention on this. Would recommend pulling back the feeding tube 1-2 cm. Would recheck an abdominal film to confirm no pneumoperitoneum in the morning. Absence of any changes okay to continue using Dobbhoff for feeding and medications. 10/28 gastrograffin study: The study confirms that the feeding tube pes perforated through the duodenum and the tip is within a cavity that fills with injected contrast. The cavity does appear walled off 11/4 refuses oral feeds, continue TF's CT reviewed: T8-T9 discitis/osteomyelitis, RT HIP FRACTURE 11/5 placed back on vasopressors, placed back on Vent due to resp acidosis, levophed turned off 11/6 afternoon 11/5 PM Trach changed out due to cuff leak, #6 Shiley cuffed, 11/6 dubhoff occluded, removed and replaced, new dubhoff shows tube in the antral stomach 11/15 refusing to take oral feeds 11/18 placed  back on Vent for increased WOB,SOB. 11/28 Wound care as re -consulted, there appears to be a new pocket/fistula  around the area of her stage IV sacral wound. 10/18/2015; the patient continues to be vent dependent, she is tachypneic, tachycardic and febrile, likely due to recurrent sepsis.  09/22/2015; fevers resolved with IV acetaminophen.  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 Pneumoniae, P. Mirabilis, VRE Wound 9/26 >> No growth Blood 9/23 >> NEG CDiff 9/27>>neg Stool Cx 10/15>> negative Cdiff 10/25>>neg Trach Aspirate 10/28>> light growth pseudomonas Blood 10/28 >> 1/2 GPC >>  Sputum cultures obtained 09/22/15 due to mucus plugging>>Pseudomonas BONE TISSUE Cx 11/3>>E.coli (ESBL), k. Pneumonia (daptomycin and septra) Bld Cx 11/27>>negative thus far.  Trach Asp 11/27>>  ANTIMICROBIALS:  Aztreonam 9/23 >> 9/24 Vanc 9/23 >> 9/25 Vanc 9/26>>9/27 Daptomycin 9/27>> 10/12, 10/28>> off Meropenem 9/23 >> 10/14, 10/28 >> 10/31 Septra 11/8>> Zosyn 11/27>>off. Zosyn 11/29, restarted.  VITAL SIGNS: Temp:  [95.8 F (35.4 C)-100.3 F (37.9 C)] 95.8 F (35.4 C) (11/30 0945) Pulse Rate:  [84-103] 97 (11/30 1330) Resp:  [11-32] 23 (11/30 1330) BP: (84-157)/(47-138) 96/63 mmHg (11/30 1330) SpO2:  [97 %-100 %] 100 % (11/30 1330) FiO2 (%):  [35 %] 35 % (11/30 0800) HEMODYNAMICS:   VENTILATOR SETTINGS: Vent Mode:  [-] PRVC FiO2 (%):  [35 %] 35 % Set Rate:  [16 bmp] 16 bmp Vt Set:  [500 mL] 500 mL PEEP:  [5 cmH20] 5 cmH20 INTAKE / OUTPUT:  Intake/Output Summary (Last 24 hours) at 10/19/15 1339 Last data filed at 10/19/15 0800  Gross per 24 hour  Intake   1550 ml  Output      0 ml  Net   1550 ml   PHYSICAL EXAMINATION: Physical  Examination:   VS: BP 96/63 mmHg  Pulse 97  Temp(Src) 95.8 F (35.4 C) (Axillary)  Resp 23  Ht  (1.753 m)  Wt 97 kg (213 lb 13.5 oz)  BMI 31.57 kg/m2  SpO2 100%  LMP  (LMP Unknown)  General Appearance: No resp distress, coarse upper airway sounds, on vent Neuro: profoundly diffusely weak, alert today, nodding head HEENT: cushingoid facies, PERRLA, EOM intact Neck: trach site clean Pulmonary: clear anteriorly Cardiovascular: reg, + syst murmur Abdomen: soft, NT, +BS Extremities: warm, R>L UE edema    LABORATORY PANEL:   CBC  Recent Labs Lab 10/19/15 0515  WBC 12.0*  HGB 7.4*  HCT 24.0*  PLT 145*    Chemistries   Recent Labs Lab 10/17/15 1545  10/19/15 0515  NA 144  < > 142  K 4.7  < > 4.6  CL 101  < > 100*  CO2 33*  < > 33*  GLUCOSE 153*  < > 183*  BUN 93*  < > 76*  CREATININE 1.52*  < > 1.27*  CALCIUM 8.9  < > 8.5*  PHOS 3.2  --   --   < > = values in this interval not displayed.     IMPRESSION/PLAN:  Recurrent sepsis, due to pneumonia versus decubitus ulcer, versus line sepsis. Repeat blood cultures are negative. Repeat sputum cultures are pending. I have restarted the patient on Zosyn. She has new anemia and  I have ordered one unit of blood.  Anemia with macrocytosis. -Uncertain if this is due to chronic slow GI bleed or simple critical care associated anemia. She has already receiving folate, thiamine, and multivitamin. Her anemia has not responded to oral medications, this may be due to her GI malabsorption.  Fever with sepsis. -Chest x-ray shows new left lower lobe infiltrate. In the buttocks have been increased, sputum cultures are pending. The patient is once again, vent dependent. She has developed again recurrent sepsis due to her multiple possible sources. Sputum cultures showing gram-negative rods. Further identification is pending.  Worsening sacral decubitus ulcer, stage IV, now with worsening. -The patient is already had a sacral  bone scraping, and has been placed on Septra for antibiotic, per ID recommendations. Her sacral decubitus does not appear to be improving and in fact appears to be worsening. In addition, she has several new smaller areas of tissue breakdown.  End-stage renal disease. -Continue on hemodialysis per nephrology.  Diarrhea. -The tube feeds were changed from vital back to Jevity as per the patient's husband's request. The patient has developed GI bleed in the past with a rectal tube due to rectal ulcer.  This patient is now developing predictable and recurrent problems, her prognosis is very poor and it is virtually impossible at this point that she can achieve any foreseeable recovery given her numerous medical issues as well as her very severe debility and deconditioning. I think we have approached the point of medical futility.   Additional Chronic issues and complicating factors during this admission.  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  LGIB C. Diff colitis.  Thrombocytopenia, resolved - HIT panel negative 9/25 Recurrent Severe sepsis, due to sacral pressure ulcer DM 2, controlled.  Chronic steroid therapy, for a history of of adrenal insufficiency Incidental finding of R hip fx - conservative mgmt. Waxing and waning encephalopathy - she was evaluated by both the psychiatry and neurology services, and it was deemed that the patient lacks decisional capacity at this time. 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 Husband declined TEE Profound deconditioning-criticall illness neuropathy Chronic pain   Husband updated at phone number [phone]. Discussed that the patient's fevers have responded to IV, acetaminophen, she continues to be vent dependent, she continues to have runny stools after changing her tube feeds. The sputum culture is growing out preliminary organisms and we will wait for identification, blood cultures were  negative. He would like to see if the new tube feed helps with the diarrhea, and await the identification of the respiratory cultures. Wells Guileseep Rondy Krupinski M.D.   Critical Care Attestation.  I have personally obtained a history, examined the patient, evaluated laboratory and imaging results, formulated the assessment and plan and placed orders. The case was discussed with the critical care RN, nutrition, respiratory therapist, Associate Professorunit manager. The Patient requires high complexity decision making for assessment and support, frequent evaluation and titration of therapies, application of advanced monitoring technologies and extensive interpretation of multiple databases. The patient has critical illness that could lead imminently to failure of 1 or more organ systems and requires the highest level of physician preparedness to intervene.  Critical Care Time devoted to patient care services described in this note is 35 minutes and is exclusive of time spent in procedures.

## 2015-10-19 NOTE — Progress Notes (Signed)
HD TX Initiation 

## 2015-10-19 NOTE — Progress Notes (Signed)
Pre HD Tx Assessment 

## 2015-10-19 NOTE — Progress Notes (Signed)
Subjective:  Tmax 103.4.  C. Diff test was negative. Remains critically ill and on the vent. Fio2 35%. Due for HD today. Blood cultures remain negative.  Objective:  Vital signs in last 24 hours:  Temp:  [98.6 F (37 C)-103.1 F (39.5 C)] 98.9 F (37.2 C) (11/30 0800) Pulse Rate:  [35-111] 85 (11/30 0800) Resp:  [11-35] 21 (11/30 0800) BP: (99-126)/(50-97) 114/71 mmHg (11/30 0800) SpO2:  [90 %-100 %] 100 % (11/30 0800) FiO2 (%):  [35 %] 35 % (11/30 0800)  Weight change:  Filed Weights    Intake/Output: I/O last 3 completed shifts: In: 2130 [I.V.:20; Blood:340; NG/GT:1220; IV Piggyback:550] Out: 950 [Other:950]   Intake/Output this shift:  Total I/O In: 50 [NG/GT:50] Out: -   Physical Exam: General: No acute distress, critically ill appearing  Head: NG in place, OM moist  Eyes: anicteric  Neck: Tracheostomy in place  Lungs:  Scattered rhonchi, vent assisted fio2 35%, tachypneic  Heart: S1S2 +tachycardia  Abdomen:  Soft, nontender  Extremities: 2+ dependent edema, anasarca  Neurologic: Lethargic but arousable  Access:  R IJ permcath.       Basic Metabolic Panel:  Recent Labs Lab 10/14/15 0514 10/15/15 1223 10/16/15 0625 10/17/15 1545 10/18/15 0640 10/19/15 0515  NA 145  --  144 144 143 142  K 4.2  --  4.2 4.7 4.1 4.6  CL 105  --  102 101 102 100*  CO2 32  --  32 33* 32 33*  GLUCOSE 109*  --  166* 153* 126* 183*  BUN 74*  --  66* 93* 57* 76*  CREATININE 1.20*  --  1.22* 1.52* 1.05* 1.27*  CALCIUM 8.5*  --  8.7* 8.9 8.5* 8.5*  PHOS  --  1.9* 2.2* 3.2  --   --     Liver Function Tests:  Recent Labs Lab 10/17/15 1545  ALBUMIN 2.2*   No results for input(s): LIPASE, AMYLASE in the last 168 hours. No results for input(s): AMMONIA in the last 168 hours.  CBC:  Recent Labs Lab 10/14/15 0514 10/16/15 0625 10/17/15 1545 10/18/15 0640 10/19/15 0145 10/19/15 0515  WBC 12.3* 11.6* 13.5* 11.3*  --  12.0*  NEUTROABS 10.9* 9.9*  --   --   --    --   HGB 7.5* 7.4* 7.3* 6.6* 7.0* 7.4*  HCT 25.0* 24.6* 24.6* 21.8* 22.5* 24.0*  MCV 101.6* 101.1* 101.5* 100.4*  --  96.6  PLT 183 169 166 151  --  145*    Cardiac Enzymes: No results for input(s): CKTOTAL, CKMB, CKMBINDEX, TROPONINI in the last 168 hours.  BNP: Invalid input(s): POCBNP  CBG:  Recent Labs Lab 10/18/15 1640 10/18/15 1955 10/19/15 0014 10/19/15 0412 10/19/15 0737  GLUCAP 171* 189* 181* 162* 156*    Microbiology: Results for orders placed or performed during the hospital encounter of 09/04/15  Blood Culture (routine x 2)     Status: None   Collection Time: 2015/09/04  8:51 AM  Result Value Ref Range Status   Specimen Description BLOOD [name]  Final   Special Requests BOTTLES DRAWN AEROBIC AND ANAEROBIC  3CC  Final   Culture NO GROWTH 5 DAYS  Final   Report Status 08/17/2015 FINAL  Final  Blood Culture (routine x 2)     Status: None   Collection Time: 2015-09-04  9:20 AM  Result Value Ref Range Status   Specimen Description BLOOD LEFT ARM  Final   Special Requests BOTTLES DRAWN AEROBIC AND ANAEROBIC  1CC  Final   Culture NO GROWTH 5 DAYS  Final   Report Status 08/17/2015 FINAL  Final  Wound culture     Status: None   Collection Time: 31-Aug-2015  9:20 AM  Result Value Ref Range Status   Specimen Description DECUBITIS  Final   Special Requests Normal  Final   Gram Stain   Final    FEW WBC SEEN MANY GRAM NEGATIVE RODS RARE GRAM POSITIVE COCCI    Culture   Final    HEAVY GROWTH ESCHERICHIA COLI MODERATE GROWTH ENTEROBACTER AEROGENES PROTEUS MIRABILIS HEAVY GROWTH ENTEROCOCCUS SPECIES VRE HAVE INTRINSIC RESISTANCE TO MOST COMMONLY USED ANTIBIOTICS AND THE ABILITY TO ACQUIRE RESISTANCE TO MOST AVAILABLE ANTIBIOTICS.    Report Status 08/16/2015 FINAL  Final   Organism ID, Bacteria ESCHERICHIA COLI  Final   Organism ID, Bacteria ENTEROBACTER AEROGENES  Final   Organism ID, Bacteria PROTEUS MIRABILIS  Final   Organism ID, Bacteria ENTEROCOCCUS SPECIES  Final       Susceptibility   Enterobacter aerogenes - MIC*    CEFTAZIDIME <=1 SENSITIVE Sensitive     CEFAZOLIN >=64 RESISTANT Resistant     CEFTRIAXONE <=1 SENSITIVE Sensitive     CIPROFLOXACIN <=0.25 SENSITIVE Sensitive     GENTAMICIN <=1 SENSITIVE Sensitive     IMIPENEM 1 SENSITIVE Sensitive     TRIMETH/SULFA <=20 SENSITIVE Sensitive     * MODERATE GROWTH ENTEROBACTER AEROGENES   Escherichia coli - MIC*    AMPICILLIN <=2 SENSITIVE Sensitive     CEFTAZIDIME <=1 SENSITIVE Sensitive     CEFAZOLIN <=4 SENSITIVE Sensitive     CEFTRIAXONE <=1 SENSITIVE Sensitive     CIPROFLOXACIN <=0.25 SENSITIVE Sensitive     GENTAMICIN <=1 SENSITIVE Sensitive     IMIPENEM <=0.25 SENSITIVE Sensitive     TRIMETH/SULFA <=20 SENSITIVE Sensitive     * HEAVY GROWTH ESCHERICHIA COLI   Proteus mirabilis - MIC*    AMPICILLIN >=32 RESISTANT Resistant     CEFTAZIDIME <=1 SENSITIVE Sensitive     CEFAZOLIN 8 SENSITIVE Sensitive     CEFTRIAXONE <=1 SENSITIVE Sensitive     CIPROFLOXACIN <=0.25 SENSITIVE Sensitive     GENTAMICIN <=1 SENSITIVE Sensitive     IMIPENEM 1 SENSITIVE Sensitive     TRIMETH/SULFA <=20 SENSITIVE Sensitive     * PROTEUS MIRABILIS   Enterococcus species - MIC*    AMPICILLIN >=32 RESISTANT Resistant     VANCOMYCIN >=32 RESISTANT Resistant     GENTAMICIN SYNERGY SENSITIVE Sensitive     TETRACYCLINE Value in next row Resistant      RESISTANT>=16    * HEAVY GROWTH ENTEROCOCCUS SPECIES  MRSA PCR Screening     Status: None   Collection Time: 31-Aug-2015  2:38 PM  Result Value Ref Range Status   MRSA by PCR NEGATIVE NEGATIVE Final    Comment:        The GeneXpert MRSA Assay (FDA approved for NASAL specimens only), is one component of a comprehensive MRSA colonization surveillance program. It is not intended to diagnose MRSA infection nor to guide or monitor treatment for MRSA infections.   Blood culture (single)     Status: None   Collection Time: 08/31/2015  3:36 PM  Result Value Ref Range  Status   Specimen Description BLOOD RIGHT ASSIST CONTROL  Final   Special Requests BOTTLES DRAWN AEROBIC AND ANAEROBIC  Final   Culture NO GROWTH 5 DAYS  Final   Report Status 08/17/2015 FINAL  Final  Wound culture  Status: None   Collection Time: 08/13/15 12:37 PM  Result Value Ref Range Status   Specimen Description WOUND  Final   Special Requests Normal  Final   Gram Stain   Final    FEW WBC SEEN TOO NUMEROUS TO COUNT GRAM NEGATIVE RODS FEW GRAM POSITIVE COCCI    Culture   Final    HEAVY GROWTH ESCHERICHIA COLI MODERATE GROWTH PROTEUS MIRABILIS LIGHT GROWTH KLEBSIELLA PNEUMONIAE MODERATE GROWTH ENTEROCOCCUS GALLINARUM CRITICAL RESULT CALLED TO, READ BACK BY AND VERIFIED WITH: [facility] BORBA AT 1042 08/16/15 DV    Report Status 08/17/2015 FINAL  Final   Organism ID, Bacteria ESCHERICHIA COLI  Final   Organism ID, Bacteria PROTEUS MIRABILIS  Final   Organism ID, Bacteria KLEBSIELLA PNEUMONIAE  Final   Organism ID, Bacteria ENTEROCOCCUS GALLINARUM  Final      Susceptibility   Escherichia coli - MIC*    AMPICILLIN >=32 RESISTANT Resistant     CEFTAZIDIME 4 RESISTANT Resistant     CEFAZOLIN >=64 RESISTANT Resistant     CEFTRIAXONE 16 RESISTANT Resistant     GENTAMICIN 2 SENSITIVE Sensitive     IMIPENEM >=16 RESISTANT Resistant     TRIMETH/SULFA <=20 SENSITIVE Sensitive     Extended ESBL POSITIVE Resistant     PIP/TAZO Value in next row Resistant      RESISTANT>=128    CIPROFLOXACIN Value in next row Sensitive      SENSITIVE<=0.25    * HEAVY GROWTH ESCHERICHIA COLI   Klebsiella pneumoniae - MIC*    AMPICILLIN Value in next row Resistant      SENSITIVE<=0.25    CEFTAZIDIME Value in next row Resistant      SENSITIVE<=0.25    CEFAZOLIN Value in next row Resistant      SENSITIVE<=0.25    CEFTRIAXONE Value in next row Resistant      SENSITIVE<=0.25    CIPROFLOXACIN Value in next row Resistant      SENSITIVE<=0.25    GENTAMICIN Value in next row Sensitive       SENSITIVE<=0.25    IMIPENEM Value in next row Resistant      SENSITIVE<=0.25    TRIMETH/SULFA Value in next row Resistant      SENSITIVE<=0.25    PIP/TAZO Value in next row Resistant      RESISTANT>=128    * LIGHT GROWTH KLEBSIELLA PNEUMONIAE   Proteus mirabilis - MIC*    AMPICILLIN Value in next row Resistant      RESISTANT>=128    CEFTAZIDIME Value in next row Sensitive      RESISTANT>=128    CEFAZOLIN Value in next row Sensitive      RESISTANT>=128    CEFTRIAXONE Value in next row Sensitive      RESISTANT>=128    CIPROFLOXACIN Value in next row Sensitive      RESISTANT>=128    GENTAMICIN Value in next row Sensitive      RESISTANT>=128    IMIPENEM Value in next row Sensitive      RESISTANT>=128    TRIMETH/SULFA Value in next row Sensitive      RESISTANT>=128    PIP/TAZO Value in next row Sensitive      SENSITIVE<=4    * MODERATE GROWTH PROTEUS MIRABILIS   Enterococcus gallinarum - MIC*    AMPICILLIN Value in next row Resistant      SENSITIVE<=4    GENTAMICIN SYNERGY Value in next row Sensitive      SENSITIVE<=4    CIPROFLOXACIN Value in next row Resistant  RESISTANT>=8    TETRACYCLINE Value in next row Resistant      RESISTANT>=16    * MODERATE GROWTH ENTEROCOCCUS GALLINARUM  Wound culture     Status: None   Collection Time: 08/15/15  2:53 PM  Result Value Ref Range Status   Specimen Description WOUND  Final   Special Requests NONE  Final   Gram Stain FEW WBC SEEN NO ORGANISMS SEEN   Final   Culture NO GROWTH 3 DAYS  Final   Report Status 08/18/2015 FINAL  Final  C difficile quick scan w PCR reflex     Status: None   Collection Time: 08/17/15 11:34 AM  Result Value Ref Range Status   C Diff antigen NEGATIVE NEGATIVE Final   C Diff toxin NEGATIVE NEGATIVE Final   C Diff interpretation Negative for C. difficile  Final  Stool culture     Status: None   Collection Time: 09/03/15  3:51 PM  Result Value Ref Range Status   Specimen Description STOOL  Final    Special Requests Immunocompromised  Final   Culture   Final    NO SALMONELLA OR SHIGELLA ISOLATED No Pathogenic E. coli detected NO CAMPYLOBACTER DETECTED    Report Status 09/07/2015 FINAL  Final  C difficile quick scan w PCR reflex     Status: None   Collection Time: 09/13/15 12:51 AM  Result Value Ref Range Status   C Diff antigen NEGATIVE NEGATIVE Final   C Diff toxin NEGATIVE NEGATIVE Final   C Diff interpretation Negative for C. difficile  Final  Culture, blood (routine x 2)     Status: None   Collection Time: 09/16/15 11:24 AM  Result Value Ref Range Status   Specimen Description BLOOD LEFT HAND  Final   Special Requests BOTTLES DRAWN AEROBIC AND ANAEROBIC  1CC  Final   Culture NO GROWTH 5 DAYS  Final   Report Status 09/21/2015 FINAL  Final  Culture, blood (routine x 2)     Status: None   Collection Time: 09/16/15 12:23 PM  Result Value Ref Range Status   Specimen Description BLOOD RIGHT HAND  Final   Special Requests BOTTLES DRAWN AEROBIC AND ANAEROBIC  1CC  Final   Culture  Setup Time   Final    GRAM POSITIVE COCCI AEROBIC BOTTLE ONLY CRITICAL RESULT CALLED TO, READ BACK BY AND VERIFIED WITH: TESS THOMAS,RN 09/17/2015 0631 BY JRS.    Culture   Final    ENTEROCOCCUS FAECALIS AEROBIC BOTTLE ONLY VRE HAVE INTRINSIC RESISTANCE TO MOST COMMONLY USED ANTIBIOTICS AND THE ABILITY TO ACQUIRE RESISTANCE TO MOST AVAILABLE ANTIBIOTICS. CRITICAL RESULT CALLED TO, READ BACK BY AND VERIFIED WITH: CHERYL SMITH AT 0454 09/19/15 DV    Report Status 09/21/2015 FINAL  Final   Organism ID, Bacteria ENTEROCOCCUS FAECALIS  Final      Susceptibility   Enterococcus faecalis - MIC*    AMPICILLIN <=2 SENSITIVE Sensitive     LINEZOLID 2 SENSITIVE Sensitive     CIPROFLOXACIN Value in next row Resistant      RESISTANT>=8    TETRACYCLINE Value in next row Resistant      RESISTANT>=16    VANCOMYCIN Value in next row Resistant      RESISTANT>=32    GENTAMICIN SYNERGY Value in next row  Resistant      RESISTANT>=32    * ENTEROCOCCUS FAECALIS  Culture, respiratory (NON-Expectorated)     Status: None   Collection Time: 09/16/15  3:50 PM  Result Value Ref Range  Status   Specimen Description TRACHEAL ASPIRATE  Final   Special Requests Immunocompromised  Final   Gram Stain   Final    FEW WBC SEEN GOOD SPECIMEN - 80-90% WBCS RARE GRAM NEGATIVE RODS    Culture LIGHT GROWTH PSEUDOMONAS AERUGINOSA  Final   Report Status 09/19/2015 FINAL  Final   Organism ID, Bacteria PSEUDOMONAS AERUGINOSA  Final      Susceptibility   Pseudomonas aeruginosa - MIC*    CEFTAZIDIME 8 SENSITIVE Sensitive     CIPROFLOXACIN 2 INTERMEDIATE Intermediate     GENTAMICIN >=16 RESISTANT Resistant     IMIPENEM >=16 RESISTANT Resistant     PIP/TAZO Value in next row Sensitive      SENSITIVE32    CEFEPIME Value in next row Sensitive      SENSITIVE8    LEVOFLOXACIN Value in next row Resistant      RESISTANT>=8    * LIGHT GROWTH PSEUDOMONAS AERUGINOSA  Culture, expectorated sputum-assessment     Status: None   Collection Time: 09/22/15  2:09 PM  Result Value Ref Range Status   Specimen Description ENDOTRACHEAL  Final   Special Requests Normal  Final   Sputum evaluation THIS SPECIMEN IS ACCEPTABLE FOR SPUTUM CULTURE  Final   Report Status 09/24/2015 FINAL  Final  Culture, respiratory (NON-Expectorated)     Status: None   Collection Time: 09/22/15  2:09 PM  Result Value Ref Range Status   Specimen Description ENDOTRACHEAL  Final   Special Requests Normal Reflexed from [id number]  Final   Gram Stain   Final    FEW WBC SEEN FEW GRAM NEGATIVE RODS POOR SPECIMEN - LESS THAN 70% WBCS    Culture   Final    MODERATE GROWTH PSEUDOMONAS AERUGINOSA LIGHT GROWTH KLEBSIELLA PNEUMONIAE REFER TO SENSITIVITIES FROM PREVIOUS CULTURE FOR ORG 2    Report Status 10/01/2015 FINAL  Final   Organism ID, Bacteria PSEUDOMONAS AERUGINOSA  Final   Organism ID, Bacteria KLEBSIELLA PNEUMONIAE  Final      Susceptibility    Pseudomonas aeruginosa - MIC*    CEFTAZIDIME 4 SENSITIVE Sensitive     CIPROFLOXACIN 2 INTERMEDIATE Intermediate     GENTAMICIN >=16 RESISTANT Resistant     IMIPENEM >=16 RESISTANT Resistant     PIP/TAZO Value in next row Sensitive      SENSITIVE16    * MODERATE GROWTH PSEUDOMONAS AERUGINOSA  Tissue culture     Status: None   Collection Time: 09/24/15  6:44 AM  Result Value Ref Range Status   Specimen Description BONE  Final   Special Requests Normal  Final   Gram Stain MODERATE WBC SEEN FEW GRAM NEGATIVE RODS   Final   Culture   Final    MODERATE GROWTH ESCHERICHIA COLI MODERATE GROWTH KLEBSIELLA PNEUMONIAE ESBL-EXTENDED SPECTRUM BETA LACTAMASE-THE ORGANISM IS RESISTANT TO PENICILLINS, CEPHALOSPORINS AND AZTREONAM ACCORDING TO CLSI M100-S15 VOL.25 N01 JAN 2005. ORGANISM 1 This organism isolate is resistant to one or more antiotic agents in three or more antimicrobial categories.  Suggest Infectious Disease consult.   ORGANISM 2 CRITICAL RESULT CALLED TO, READ BACK BY AND VERIFIED WITH: RN Mardene Celeste LINDSAY 09/27/15 1005AM    Report Status 09/28/2015 FINAL  Final   Organism ID, Bacteria ESCHERICHIA COLI  Final   Organism ID, Bacteria KLEBSIELLA PNEUMONIAE  Final      Susceptibility   Escherichia coli - MIC*    AMPICILLIN >=32 RESISTANT Resistant     CEFTAZIDIME 4 RESISTANT Resistant     CEFAZOLIN >=64 RESISTANT  Resistant     CEFTRIAXONE 8 RESISTANT Resistant     GENTAMICIN <=1 SENSITIVE Sensitive     IMIPENEM 8 RESISTANT Resistant     TRIMETH/SULFA <=20 SENSITIVE Sensitive     Extended ESBL POSITIVE Resistant     PIP/TAZO Value in next row Resistant      RESISTANT>=128    * MODERATE GROWTH ESCHERICHIA COLI   Klebsiella pneumoniae - MIC*    AMPICILLIN Value in next row Resistant      RESISTANT>=128    CEFTAZIDIME Value in next row Resistant      RESISTANT>=128    CEFAZOLIN Value in next row Resistant      RESISTANT>=128    CEFTRIAXONE Value in next row Resistant       RESISTANT>=128    CIPROFLOXACIN Value in next row Resistant      RESISTANT>=128    GENTAMICIN Value in next row Resistant      RESISTANT>=128    IMIPENEM Value in next row Resistant      RESISTANT>=128    TRIMETH/SULFA Value in next row Sensitive      RESISTANT>=128    PIP/TAZO Value in next row Resistant      RESISTANT>=128    * MODERATE GROWTH KLEBSIELLA PNEUMONIAE  Anaerobic culture     Status: None   Collection Time: 09/24/15  3:55 PM  Result Value Ref Range Status   Specimen Description BONE  Final   Special Requests Normal  Final   Culture NO ANAEROBES ISOLATED  Final   Report Status 09/29/2015 FINAL  Final  Culture, fungus without smear     Status: None   Collection Time: 09/24/15  3:55 PM  Result Value Ref Range Status   Specimen Description BONE  Final   Special Requests Normal  Final   Culture NO FUNGUS ISOLATED AFTER 24 DAYS  Final   Report Status 10/18/2015 FINAL  Final  C difficile quick scan w PCR reflex     Status: None   Collection Time: 10/07/15  2:02 PM  Result Value Ref Range Status   C Diff antigen NEGATIVE NEGATIVE Final   C Diff toxin NEGATIVE NEGATIVE Final   C Diff interpretation Negative for C. difficile  Final  Culture, blood (routine x 2)     Status: None (Preliminary result)   Collection Time: 10/16/15  9:52 AM  Result Value Ref Range Status   Specimen Description BLOOD LEFT HAND  Final   Special Requests   Final    BOTTLES DRAWN AEROBIC AND ANAEROBIC  AER 6CC ANA 4CC   Culture NO GROWTH 3 DAYS  Final   Report Status PENDING  Incomplete  Culture, blood (routine x 2)     Status: None (Preliminary result)   Collection Time: 10/16/15 12:25 PM  Result Value Ref Range Status   Specimen Description BLOOD LEFT HAND  Final   Special Requests BOTTLES DRAWN AEROBIC AND ANAEROBIC  5CC  Final   Culture NO GROWTH 3 DAYS  Final   Report Status PENDING  Incomplete  Culture, expectorated sputum-assessment     Status: None   Collection Time: 10/18/15  1:15  PM  Result Value Ref Range Status   Specimen Description SPUTUM  Final   Special Requests NONE  Final   Sputum evaluation THIS SPECIMEN IS ACCEPTABLE FOR SPUTUM CULTURE  Final   Report Status 10/18/2015 FINAL  Final  C difficile quick scan w PCR reflex     Status: None   Collection Time: 10/18/15  4:39  PM  Result Value Ref Range Status   C Diff antigen NEGATIVE NEGATIVE Final   C Diff toxin NEGATIVE NEGATIVE Final   C Diff interpretation Negative for C. difficile  Final    Coagulation Studies: No results for input(s): LABPROT, INR in the last 72 hours.  Urinalysis: No results for input(s): COLORURINE, LABSPEC, PHURINE, GLUCOSEU, HGBUR, BILIRUBINUR, KETONESUR, PROTEINUR, UROBILINOGEN, NITRITE, LEUKOCYTESUR in the last 72 hours.  Invalid input(s): APPERANCEUR    Imaging: Dg Chest 1 View  10/19/2015  CLINICAL DATA:  Shortness of Breath EXAM: CHEST 1 VIEW COMPARISON:  October 18, 2015 FINDINGS: Tracheostomy catheter tip is 6.4 cm above the carina. Right-sided central catheter tip is just beyond the cavoatrial junction in the right atrium. The left-sided central catheter tip is in the superior vena cava. Feeding tube tip is not seen; feeding tube tip is below the diaphragm. No pneumothorax. There is stable cardiomegaly with pulmonary venous hypertension. There is interstitial edema, more on the left than on the right, stable. There is patchy consolidation in the left lower lobe, stable. No new opacity. No adenopathy. IMPRESSION: Evidence of a degree of congestive heart failure. Question superimposed pneumonia left lower lobe. No new opacity. No change in cardiac silhouette. Tube and catheter positions as described. Electronically Signed   By: Bretta Bang III M.D.   On: 10/19/2015 07:17   Dg Chest 1 View  10/18/2015  CLINICAL DATA:  Dyspnea. EXAM: CHEST 1 VIEW COMPARISON:  October 16, 2015. FINDINGS: Stable cardiomediastinal silhouette. Tracheostomy and feeding tube are unchanged in  position. Right internal jugular dialysis catheter is unchanged in position. Left internal jugular central venous catheter is unchanged with distal tip in expected position of the SVC. No pneumothorax is noted. Mild central pulmonary vascular congestion is again noted. Stable left perihilar and basilar opacity is noted concerning for pneumonia or edema with possible minimal left pleural effusion. Stable mild right basilar opacity is noted. Bony thorax is intact. IMPRESSION: Stable support apparatus. Stable bilateral lung opacities, with left greater than right. Electronically Signed   By: Lupita Raider, M.D.   On: 10/18/2015 07:38     Medications:   . feeding supplement (JEVITY 1.5 CAL/FIBER) 1,000 mL (10/18/15 1202)  . norepinephrine Stopped (10/08/15 0530)   . albumin human  25 g Intravenous Once  . amantadine  200 mg Per Tube Q7 days  . antiseptic oral rinse  7 mL Mouth Rinse QID  . aspirin  81 mg Oral Daily  . chlorhexidine gluconate  15 mL Mouth Rinse BID  . epoetin (EPOGEN/PROCRIT) injection  10,000 Units Intravenous Q M,W,F-HD  . escitalopram  10 mg Oral Daily  . feeding supplement (PRO-STAT SUGAR FREE 64)  30 mL Oral 6 X Daily  . folic acid  1 mg Oral Daily  . free water  20 mL Per Tube 6 times per day  . heparin subcutaneous  5,000 Units Subcutaneous Q12H  . hydrocortisone  10 mg Per Tube BID  . insulin aspart  0-9 Units Subcutaneous 6 times per day  . ipratropium-albuterol  3 mL Nebulization QID  . levofloxacin (LEVAQUIN) IV  500 mg Intravenous Q48H  . lidocaine  1 patch Transdermal Q24H  . loperamide  2 mg Oral BID  . midodrine  5 mg Per Tube TID WC  . multivitamin  5 mL Oral Daily  . pantoprazole sodium  40 mg Per Tube Daily  . saccharomyces boulardii  250 mg Oral BID  . sodium chloride  10-40 mL Intracatheter  Q12H  . sodium hypochlorite   Irrigation BID  . sulfamethoxazole-trimethoprim  2.5 tablet Per Tube q1800  . thiamine  100 mg Per Tube Daily   sodium chloride,  sodium chloride, acetaminophen (TYLENOL) oral liquid 160 mg/5 mL, HYDROmorphone (DILAUDID) injection, morphine injection, ondansetron (ZOFRAN) IV, oxyCODONE, sodium chloride, zinc oxide  Assessment/ Plan:  49 y.o. 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 Admitted on 08/06/2015  1. End-stage renal disease on hemodialysis on HD MWF. The patient has been on dialysis since October of 2014. R IJ permcath. Dialysis treatment with IV albumin for oncotic support.  - Patient due for HD today, UF target 1kg using albumin, low BFRs.   2. Anemia of CKD: Hgb currently 7.4, continue epogen.   3. Secondary hyperparathyroidism: PTH low at 55 (9/25) - remains off phos binders, periodically monitor phos.   4. Sepsis: VRE in blood. Bone cultures E. Coli and klebsiella - Treatment as per ID and ICU team: daptomycin completed on 11/10 and now on trimethoprim/sulfamethoxazole and levaquin. -still having fevers, blood cultures and C. Diff toxin negative, sputum cultures taken.  Levaquin added at the moment.  5. Generalized Dependent edema/Anasarca due to 3rd spacing -  UF target 1kg.   6. Acute resp failure - New infiltrate was noted in the left lower lobe on Xray. -abx management per pulm/cc and ID.    LOS: 68 Courtney [name] 11/30/20168:32 AM

## 2015-10-19 NOTE — Progress Notes (Signed)
PT Cancellation Note  Patient Details Name: Courtney [name] MRN: [id number] DOB: [birth year]   Cancelled Treatment:    Reason Eval/Treat Not Completed: Patient at procedure or test/unavailable (Treatment session attempted.  Patient currently receiving dialysis; unavailable for participation with treatment session.  Will re-attempt at later time/date as medically appropriate.)   Breeze Angell H. Manson PasseyBrown, PT, DPT, NCS 10/19/2015, 10:38 AM [phone]

## 2015-10-19 NOTE — Progress Notes (Signed)
Pre Tx Documentation of Patient

## 2015-10-19 NOTE — Progress Notes (Signed)
Pre HD TX Documentation of Pt & Machine

## 2015-10-19 NOTE — Progress Notes (Signed)
Hd Tx Termination 

## 2015-10-19 NOTE — Progress Notes (Signed)
MEDICATION RELATED CONSULT NOTE - Follow up   Pharmacy Consult for Renal dosing  Allergies  Allergen Reactions  . Contrast Media [Iodinated Diagnostic Agents] Anaphylaxis  . Ampicillin Rash    Patient Measurements: Height: 5\' 9"  (175.3 cm) Weight:  (not able to weigh due to bed ) IBW/kg (Calculated) : 66.2  Vital Signs: Temp: 98.9 F (37.2 C) (11/30 0800) Temp Source: Oral (11/30 0800) BP: 114/71 mmHg (11/30 0800) Pulse Rate: 85 (11/30 0800) Intake/Output from previous day: 11/29 0701 - 11/30 0700 In: 2120 [I.V.:10; Blood:340; NG/GT:1220; IV Piggyback:550] Out: -  Intake/Output from this shift: Total I/O In: 50 [NG/GT:50] Out: -   Labs:  Recent Labs  10/17/15 1545 10/18/15 0640 10/19/15 0145 10/19/15 0515  WBC 13.5* 11.3*  --  12.0*  HGB 7.3* 6.6* 7.0* 7.4*  HCT 24.6* 21.8* 22.5* 24.0*  PLT 166 151  --  145*  CREATININE 1.52* 1.05*  --  1.27*  PHOS 3.2  --   --   --   ALBUMIN 2.2*  --   --   --    Estimated Creatinine Clearance: 65.7 mL/min (by C-G formula based on Cr of 1.27).   Microbiology: Recent Results (from the past 720 hour(s))  Culture, expectorated sputum-assessment     Status: None   Collection Time: 09/22/15  2:09 PM  Result Value Ref Range Status   Specimen Description ENDOTRACHEAL  Final   Special Requests Normal  Final   Sputum evaluation THIS SPECIMEN IS ACCEPTABLE FOR SPUTUM CULTURE  Final   Report Status 09/24/2015 FINAL  Final  Culture, respiratory (NON-Expectorated)     Status: None   Collection Time: 09/22/15  2:09 PM  Result Value Ref Range Status   Specimen Description ENDOTRACHEAL  Final   Special Requests Normal Reflexed from [id number]  Final   Gram Stain   Final    FEW WBC SEEN FEW GRAM NEGATIVE RODS POOR SPECIMEN - LESS THAN 70% WBCS    Culture   Final    MODERATE GROWTH PSEUDOMONAS AERUGINOSA LIGHT GROWTH KLEBSIELLA PNEUMONIAE REFER TO SENSITIVITIES FROM PREVIOUS CULTURE FOR ORG 2    Report Status 10/01/2015 FINAL   Final   Organism ID, Bacteria PSEUDOMONAS AERUGINOSA  Final   Organism ID, Bacteria KLEBSIELLA PNEUMONIAE  Final      Susceptibility   Pseudomonas aeruginosa - MIC*    CEFTAZIDIME 4 SENSITIVE Sensitive     CIPROFLOXACIN 2 INTERMEDIATE Intermediate     GENTAMICIN >=16 RESISTANT Resistant     IMIPENEM >=16 RESISTANT Resistant     PIP/TAZO Value in next row Sensitive      SENSITIVE16    * MODERATE GROWTH PSEUDOMONAS AERUGINOSA  Tissue culture     Status: None   Collection Time: 09/24/15  6:44 AM  Result Value Ref Range Status   Specimen Description BONE  Final   Special Requests Normal  Final   Gram Stain MODERATE WBC SEEN FEW GRAM NEGATIVE RODS   Final   Culture   Final    MODERATE GROWTH ESCHERICHIA COLI MODERATE GROWTH KLEBSIELLA PNEUMONIAE ESBL-EXTENDED SPECTRUM BETA LACTAMASE-THE ORGANISM IS RESISTANT TO PENICILLINS, CEPHALOSPORINS AND AZTREONAM ACCORDING TO CLSI M100-S15 VOL.25 N01 JAN 2005. ORGANISM 1 This organism isolate is resistant to one or more antiotic agents in three or more antimicrobial categories.  Suggest Infectious Disease consult.   ORGANISM 2 CRITICAL RESULT CALLED TO, READ BACK BY AND VERIFIED WITH: RN Mardene CelesteJOANNA LINDSAY 09/27/15 1005AM    Report Status 09/28/2015 FINAL  Final   Organism ID,  Bacteria ESCHERICHIA COLI  Final   Organism ID, Bacteria KLEBSIELLA PNEUMONIAE  Final      Susceptibility   Escherichia coli - MIC*    AMPICILLIN >=32 RESISTANT Resistant     CEFTAZIDIME 4 RESISTANT Resistant     CEFAZOLIN >=64 RESISTANT Resistant     CEFTRIAXONE 8 RESISTANT Resistant     GENTAMICIN <=1 SENSITIVE Sensitive     IMIPENEM 8 RESISTANT Resistant     TRIMETH/SULFA <=20 SENSITIVE Sensitive     Extended ESBL POSITIVE Resistant     PIP/TAZO Value in next row Resistant      RESISTANT>=128    * MODERATE GROWTH ESCHERICHIA COLI   Klebsiella pneumoniae - MIC*    AMPICILLIN Value in next row Resistant      RESISTANT>=128    CEFTAZIDIME Value in next row Resistant       RESISTANT>=128    CEFAZOLIN Value in next row Resistant      RESISTANT>=128    CEFTRIAXONE Value in next row Resistant      RESISTANT>=128    CIPROFLOXACIN Value in next row Resistant      RESISTANT>=128    GENTAMICIN Value in next row Resistant      RESISTANT>=128    IMIPENEM Value in next row Resistant      RESISTANT>=128    TRIMETH/SULFA Value in next row Sensitive      RESISTANT>=128    PIP/TAZO Value in next row Resistant      RESISTANT>=128    * MODERATE GROWTH KLEBSIELLA PNEUMONIAE  Anaerobic culture     Status: None   Collection Time: 09/24/15  3:55 PM  Result Value Ref Range Status   Specimen Description BONE  Final   Special Requests Normal  Final   Culture NO ANAEROBES ISOLATED  Final   Report Status 09/29/2015 FINAL  Final  Culture, fungus without smear     Status: None   Collection Time: 09/24/15  3:55 PM  Result Value Ref Range Status   Specimen Description BONE  Final   Special Requests Normal  Final   Culture NO FUNGUS ISOLATED AFTER 24 DAYS  Final   Report Status 10/18/2015 FINAL  Final  C difficile quick scan w PCR reflex     Status: None   Collection Time: 10/07/15  2:02 PM  Result Value Ref Range Status   C Diff antigen NEGATIVE NEGATIVE Final   C Diff toxin NEGATIVE NEGATIVE Final   C Diff interpretation Negative for C. difficile  Final  Culture, blood (routine x 2)     Status: None (Preliminary result)   Collection Time: 10/16/15  9:52 AM  Result Value Ref Range Status   Specimen Description BLOOD LEFT HAND  Final   Special Requests   Final    BOTTLES DRAWN AEROBIC AND ANAEROBIC  AER 6CC ANA 4CC   Culture NO GROWTH 3 DAYS  Final   Report Status PENDING  Incomplete  Culture, blood (routine x 2)     Status: None (Preliminary result)   Collection Time: 10/16/15 12:25 PM  Result Value Ref Range Status   Specimen Description BLOOD LEFT HAND  Final   Special Requests BOTTLES DRAWN AEROBIC AND ANAEROBIC  5CC  Final   Culture NO GROWTH 3 DAYS  Final    Report Status PENDING  Incomplete  Culture, expectorated sputum-assessment     Status: None   Collection Time: 10/18/15  1:15 PM  Result Value Ref Range Status   Specimen Description SPUTUM  Final  Special Requests NONE  Final   Sputum evaluation THIS SPECIMEN IS ACCEPTABLE FOR SPUTUM CULTURE  Final   Report Status 10/18/2015 FINAL  Final  C difficile quick scan w PCR reflex     Status: None   Collection Time: 10/18/15  4:39 PM  Result Value Ref Range Status   C Diff antigen NEGATIVE NEGATIVE Final   C Diff toxin NEGATIVE NEGATIVE Final   C Diff interpretation Negative for C. difficile  Final    Medical History: Past Medical History  Diagnosis Date  . Obesity   . Dyslipidemia   . Hypertension   . Coronary artery disease     s/p BMS 2010 LAD  . Dysrhythmia     ventricular tachycardia resolved after LAD stent and beta blocker  . Diabetes mellitus     with retinopathy, neuropathy and microalbuminemia  . ESRD (end stage renal disease) on dialysis     Assessment: 49 yo patient on HD. Pharmacy consulted for renal dosing of medications.   Plan:  No medications require adjustment at present. Pharmacy will continue to monitor patients medications and dose adjust as needed.    Cher Nakai, PharmD Pharmacy Resident 10/19/2015

## 2015-10-19 NOTE — Progress Notes (Signed)
This note also relates to the following rows which could not be included: Pulse Rate - Cannot attach notes to unvalidated device data Resp - Cannot attach notes to unvalidated device data SpO2 - Cannot attach notes to unvalidated device data End Tidal CO2 (EtCO2) - Cannot attach notes to unvalidated device data

## 2015-10-19 NOTE — Progress Notes (Signed)
ANTIBIOTIC CONSULT NOTE - Follow Up  Pharmacy Consult for Levaquin  Indication: pneumonia  Allergies  Allergen Reactions  . Contrast Media [Iodinated Diagnostic Agents] Anaphylaxis  . Ampicillin Rash    Patient Measurements: Height:  (175.3 cm) Weight:  (not able to weigh due to bed ) IBW/kg (Calculated) : 66.2  Vital Signs: Temp: 98.9 F (37.2 C) (11/30 0800) Temp Source: Oral (11/30 0800) BP: 114/71 mmHg (11/30 0800) Pulse Rate: 85 (11/30 0800) Intake/Output from previous day: 11/29 0701 - 11/30 0700 In: 2120 [I.V.:10; Blood:340; NG/GT:1220; IV Piggyback:550] Out: -  Intake/Output from this shift: Total I/O In: 50 [NG/GT:50] Out: -   Labs:  Recent Labs  10/17/15 1545 10/18/15 0640 10/19/15 0145 10/19/15 0515  WBC 13.5* 11.3*  --  12.0*  HGB 7.3* 6.6* 7.0* 7.4*  PLT 166 151  --  145*  CREATININE 1.52* 1.05*  --  1.27*   Estimated Creatinine Clearance: 65.7 mL/min (by C-G formula based on Cr of 1.27). No results for input(s): VANCOTROUGH, VANCOPEAK, VANCORANDOM, GENTTROUGH, GENTPEAK, GENTRANDOM, TOBRATROUGH, TOBRAPEAK, TOBRARND, AMIKACINPEAK, AMIKACINTROU, AMIKACIN in the last 72 hours.   Microbiology: Recent Results (from the past 720 hour(s))  Culture, expectorated sputum-assessment     Status: None   Collection Time: 09/22/15  2:09 PM  Result Value Ref Range Status   Specimen Description ENDOTRACHEAL  Final   Special Requests Normal  Final   Sputum evaluation THIS SPECIMEN IS ACCEPTABLE FOR SPUTUM CULTURE  Final   Report Status 09/24/2015 FINAL  Final  Culture, respiratory (NON-Expectorated)     Status: None   Collection Time: 09/22/15  2:09 PM  Result Value Ref Range Status   Specimen Description ENDOTRACHEAL  Final   Special Requests Normal Reflexed from [id number]  Final   Gram Stain   Final    FEW WBC SEEN FEW GRAM NEGATIVE RODS POOR SPECIMEN - LESS THAN 70% WBCS    Culture   Final    MODERATE GROWTH PSEUDOMONAS AERUGINOSA LIGHT GROWTH  KLEBSIELLA PNEUMONIAE REFER TO SENSITIVITIES FROM PREVIOUS CULTURE FOR ORG 2    Report Status 10/01/2015 FINAL  Final   Organism ID, Bacteria PSEUDOMONAS AERUGINOSA  Final   Organism ID, Bacteria KLEBSIELLA PNEUMONIAE  Final      Susceptibility   Pseudomonas aeruginosa - MIC*    CEFTAZIDIME 4 SENSITIVE Sensitive     CIPROFLOXACIN 2 INTERMEDIATE Intermediate     GENTAMICIN >=16 RESISTANT Resistant     IMIPENEM >=16 RESISTANT Resistant     PIP/TAZO Value in next row Sensitive      SENSITIVE16    * MODERATE GROWTH PSEUDOMONAS AERUGINOSA  Tissue culture     Status: None   Collection Time: 09/24/15  6:44 AM  Result Value Ref Range Status   Specimen Description BONE  Final   Special Requests Normal  Final   Gram Stain MODERATE WBC SEEN FEW GRAM NEGATIVE RODS   Final   Culture   Final    MODERATE GROWTH ESCHERICHIA COLI MODERATE GROWTH KLEBSIELLA PNEUMONIAE ESBL-EXTENDED SPECTRUM BETA LACTAMASE-THE ORGANISM IS RESISTANT TO PENICILLINS, CEPHALOSPORINS AND AZTREONAM ACCORDING TO CLSI M100-S15 VOL.25 N01 JAN 2005. ORGANISM 1 This organism isolate is resistant to one or more antiotic agents in three or more antimicrobial categories.  Suggest Infectious Disease consult.   ORGANISM 2 CRITICAL RESULT CALLED TO, READ BACK BY AND VERIFIED WITH: RN Mardene Celeste LINDSAY 09/27/15 1005AM    Report Status 09/28/2015 FINAL  Final   Organism ID, Bacteria ESCHERICHIA COLI  Final   Organism ID,  Bacteria KLEBSIELLA PNEUMONIAE  Final      Susceptibility   Escherichia coli - MIC*    AMPICILLIN >=32 RESISTANT Resistant     CEFTAZIDIME 4 RESISTANT Resistant     CEFAZOLIN >=64 RESISTANT Resistant     CEFTRIAXONE 8 RESISTANT Resistant     GENTAMICIN <=1 SENSITIVE Sensitive     IMIPENEM 8 RESISTANT Resistant     TRIMETH/SULFA <=20 SENSITIVE Sensitive     Extended ESBL POSITIVE Resistant     PIP/TAZO Value in next row Resistant      RESISTANT>=128    * MODERATE GROWTH ESCHERICHIA COLI   Klebsiella pneumoniae -  MIC*    AMPICILLIN Value in next row Resistant      RESISTANT>=128    CEFTAZIDIME Value in next row Resistant      RESISTANT>=128    CEFAZOLIN Value in next row Resistant      RESISTANT>=128    CEFTRIAXONE Value in next row Resistant      RESISTANT>=128    CIPROFLOXACIN Value in next row Resistant      RESISTANT>=128    GENTAMICIN Value in next row Resistant      RESISTANT>=128    IMIPENEM Value in next row Resistant      RESISTANT>=128    TRIMETH/SULFA Value in next row Sensitive      RESISTANT>=128    PIP/TAZO Value in next row Resistant      RESISTANT>=128    * MODERATE GROWTH KLEBSIELLA PNEUMONIAE  Anaerobic culture     Status: None   Collection Time: 09/24/15  3:55 PM  Result Value Ref Range Status   Specimen Description BONE  Final   Special Requests Normal  Final   Culture NO ANAEROBES ISOLATED  Final   Report Status 09/29/2015 FINAL  Final  Culture, fungus without smear     Status: None   Collection Time: 09/24/15  3:55 PM  Result Value Ref Range Status   Specimen Description BONE  Final   Special Requests Normal  Final   Culture NO FUNGUS ISOLATED AFTER 24 DAYS  Final   Report Status 10/18/2015 FINAL  Final  C difficile quick scan w PCR reflex     Status: None   Collection Time: 10/07/15  2:02 PM  Result Value Ref Range Status   C Diff antigen NEGATIVE NEGATIVE Final   C Diff toxin NEGATIVE NEGATIVE Final   C Diff interpretation Negative for C. difficile  Final  Culture, blood (routine x 2)     Status: None (Preliminary result)   Collection Time: 10/16/15  9:52 AM  Result Value Ref Range Status   Specimen Description BLOOD LEFT HAND  Final   Special Requests   Final    BOTTLES DRAWN AEROBIC AND ANAEROBIC  AER 6CC ANA 4CC   Culture NO GROWTH 3 DAYS  Final   Report Status PENDING  Incomplete  Culture, blood (routine x 2)     Status: None (Preliminary result)   Collection Time: 10/16/15 12:25 PM  Result Value Ref Range Status   Specimen Description BLOOD LEFT  HAND  Final   Special Requests BOTTLES DRAWN AEROBIC AND ANAEROBIC  5CC  Final   Culture NO GROWTH 3 DAYS  Final   Report Status PENDING  Incomplete  Culture, expectorated sputum-assessment     Status: None   Collection Time: 10/18/15  1:15 PM  Result Value Ref Range Status   Specimen Description SPUTUM  Final   Special Requests NONE  Final   Sputum  evaluation THIS SPECIMEN IS ACCEPTABLE FOR SPUTUM CULTURE  Final   Report Status 10/18/2015 FINAL  Final  C difficile quick scan w PCR reflex     Status: None   Collection Time: 10/18/15  4:39 PM  Result Value Ref Range Status   C Diff antigen NEGATIVE NEGATIVE Final   C Diff toxin NEGATIVE NEGATIVE Final   C Diff interpretation Negative for C. difficile  Final    Medical History: Past Medical History  Diagnosis Date  . Obesity   . Dyslipidemia   . Hypertension   . Coronary artery disease     s/p BMS 2010 LAD  . Dysrhythmia     ventricular tachycardia resolved after LAD stent and beta blocker  . Diabetes mellitus     with retinopathy, neuropathy and microalbuminemia  . ESRD (end stage renal disease) on dialysis    Assessment: 49 yo female being treated for HCAP. Pharmacy consulted for dosing and monitoring of levofloxacin. Patient receives HD M,W,F. Patient was given  x1 dose on 11/29.  Plan:  Will continue levofloxacin  every 48 hours , so that it follows her HD.  Pharmacy will continue to monitor labs and renal function and make adjustments as needed.   Cher Nakai, PharmD Pharmacy Resident 10/19/2015.

## 2015-10-19 NOTE — Progress Notes (Signed)
Post HD Tx Documentation 

## 2015-10-19 NOTE — Plan of Care (Signed)
Problem: Phase I Progression Outcomes Goal: Progress activity as tolerated unless otherwise ordered Outcome: Not Progressing Bedrest. PROM. Frequent turning and bathing. Pt only able to move left arm spontaneously Goal: Discharge plan established Outcome: Not Progressing Care Management continues to work on placement Goal: Tolerating diet Outcome: Progressing Jevity 1.2. At 8950ml/hr. Prostat given. Diarrhea stools negative for c-diff  Problem: Phase II Progression Outcomes Goal: Pain controlled on oral analgesia Outcome: Progressing Oxycodone for pain at1700 Goal: Dyspnea controlled w/progressive activity Outcome: Progressing Some dyspnea and tachypnea with dressing changes and linen changes. Goal: ADLs completed with minimal assistance Outcome: Not Progressing Pt fully dependent on all care

## 2015-10-20 LAB — GLUCOSE, CAPILLARY
GLUCOSE-CAPILLARY: 130 mg/dL — AB (ref 65–99)
GLUCOSE-CAPILLARY: 130 mg/dL — AB (ref 65–99)
GLUCOSE-CAPILLARY: 156 mg/dL — AB (ref 65–99)
Glucose-Capillary: 114 mg/dL — ABNORMAL HIGH (ref 65–99)
Glucose-Capillary: 117 mg/dL — ABNORMAL HIGH (ref 65–99)
Glucose-Capillary: 144 mg/dL — ABNORMAL HIGH (ref 65–99)
Glucose-Capillary: 93 mg/dL (ref 65–99)

## 2015-10-20 MED ORDER — LOPERAMIDE HCL 2 MG PO CAPS
2.0000 mg | ORAL_CAPSULE | ORAL | Status: DC | PRN
  Administered 2015-10-20: 2 mg via ORAL
  Filled 2015-10-20 (×4): qty 1

## 2015-10-20 MED ORDER — LOPERAMIDE HCL 2 MG PO CAPS: 2.0000 mg | ORAL_CAPSULE | ORAL | Status: DC | PRN

## 2015-10-20 MED ORDER — CEFTAZIDIME 1 G IJ SOLR
1.0000 g | Freq: Three times a day (TID) | INTRAMUSCULAR | Status: DC
  Filled 2015-10-20: qty 1

## 2015-10-20 MED ORDER — DEXTROSE 5 % IV SOLN
1.0000 g | INTRAVENOUS | Status: AC
  Administered 2015-10-20 – 2015-10-26 (×7): 1 g via INTRAVENOUS
  Filled 2015-10-20 (×7): qty 1

## 2015-10-20 NOTE — Progress Notes (Signed)
   10/20/15 1000  Vitals  BP (!) 81/34 mmHg  MAP (mmHg) 49  ECG Heart Rate 91  Resp 18  Oxygen Therapy  End Tidal CO2 (EtCO2) 45  MD Ram aware of low BPs, pt received pain meds during the night, pt is arousal to speech, and other VS stable, nursing will cont to monitor

## 2015-10-20 NOTE — Evaluation (Signed)
Physical Therapy Evaluation Patient Details Name: Courtney [name] MRN: [id number] DOB: [birth year] Today's Date: 10/20/2015   History of Present Illness  Pt is a 24 F who has been in medical facilities (hosp, LTAC, rehab) for 2 yrs following gastric bypass surgery with multiple complications. Now with chronic trach, ESRD, profound debilitation, severe sacral pressure ulcer (stage IV). Was seemingly making progress and transferred to rehab facility approx one week prior to this admission. She was sent to [facility] ED 9/23 with AMS and hypotension. Admitted with sepsis related to infected sacral ulcer.  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  Patient transferred to CCU to med-surg floor during hospitalization, but experienced profound interdialytic hypotension requiring return transfer to CCU for use of pressors (now off); remains in CCU.  Noted with R hip intertrochanteric fracture, recommended for conservative management per orthopedics.  Hospital course continues to be complicated with recurrent sepsis requiring patient on/off vent multiple times; currently on vent support with PEEP 5.  Limited ability/willingness to follow commands or actively participate in therapeutic interventions.  Per care team, physicians recommending hospice care/comfort measures at this time; husband considering goals of care/POC pending discussion with patient and other family members, but wishes continued medical care at this time. Will carefully monitor medical plan and POC to evaluate appropriateness of continued PT throughout remaining hospitalization.    Clinical Impression  Upon evaluation, patient does open eyes to voice/touch when therapist enters room, does visually track therapist this date.  Intermittently nods head yes/no, though question accuracy. Demonstrates  mild active movement in bilat UEs, L > R this date, though not adequate for any functional activity.  Shows limited active effort with bilat UE strength/ROM assessment, but fatigues very quickly.  R LE remains immobilized/NWB due to R hip fracture; will continue this until cleared by orthopedics. L LE remains fully flaccid with some degree of pain indicated (facial grimacing).  Additional mobility assessment or progression beyond bed-level therex contraindicated at this time due to multiple medical co-morbidities.   Husband has been educated in HEP for LE ROM and maintenance care.   Question benefit of repeat imaging to R LE and re-consult to orthopedics to evaluate current status of R hip and potential for progression of R LE therex/bed mobility.  Discussed with care manager (to review in rounding); attending MD not available. Provided patient will demonstrate some degree of willingness to participate, some degree of active effort and is medically appropriate for continued services, skilled PT will continue with emphasis on bilat UE and L LE ROM for strengthening (as able), contracture prevention and prevention of further skin breakdown.  At this time, will extend POC additional two weeks and continue to provide services 2x during weekdays and 1x on weekend as able and appropriate.  If patient consistently refuses or repeatedly shows disinterest or pain with PT efforts, will consider discontinuation of PT services during remaining hospitalization. Will closely monitor response to and participation with treatment and overall medical condition, goals of care to ensure appropriateness of continued PT efforts.    Follow Up Recommendations LTACH;SNF    Equipment Recommendations       Recommendations for Other Services       Precautions / Restrictions Precautions Precautions: Fall Precaution Comments: stage 4 sacral ulcer, NG tube/NPO, trach, contact isolation, R IJ permcath, L IJ central line, log  roll only (as needed for dressing changes/hygiene) and  no ROM to R LE/no attempts at sitting Restrictions Weight Bearing Restrictions: Yes RLE Weight Bearing: Non weight bearing Other Position/Activity Restrictions: No ROM R LE      Mobility  Bed Mobility               General bed mobility comments: Deferred/contraindicated  Transfers                 General transfer comment: Deferred/contraindicated  Ambulation/Gait                Stairs            Wheelchair Mobility    Modified Rankin (Stroke Patients Only)       Balance Overall balance assessment:  (deferred/contraindicated secondary to medical condition)                                           Pertinent Vitals/Pain Pain Assessment: Faces Faces Pain Scale: Hurts a little bit Pain Location: facial grimacing with movement of L ankle; unable to describe, localize Pain Descriptors / Indicators: Grimacing Pain Intervention(s): Limited activity within patient's tolerance;Monitored during session;Repositioned    Home Living Family/patient expects to be discharged to:: Skilled nursing facility                 Additional Comments: patient unable to provide information regarding PLOF or level of participation/function prior to hospitalization    Prior Function Level of Independence: Needs assistance   Gait / Transfers Assistance Needed: pt is bed bound and does not transfer at this time  ADL's / Homemaking Assistance Needed: Dependent for all ADL  Comments: patient unable to provide information regarding PLOF or level of participation/function prior to hospitalization; will verify from family/resources as appropriate     Hand Dominance        Extremity/Trunk Assessment   Upper Extremity Assessment:  (spontaneous movement of L UE only, 3-/5; trace movment noted R elbow flexion, R wrist extension and gross grasp, otherwise flaccid.)           Lower  Extremity Assessment:  (L LE grossly flaccid without attempts at active movement noted.  L ankle DF to neutral, hip/knee flexion to approx 70 degrees; minimal clinical indicators of pain this date.  R LE immobilized due to R hip fracture.)      Cervical / Trunk Assessment: Normal (maintains neutral alignment at this time)  Communication   Communication: Tracheostomy  Cognition Arousal/Alertness: Awake/alert Behavior During Therapy: Flat affect (visually following and attending to therapist this date) Overall Cognitive Status: Difficult to assess                      General Comments      Exercises Other Exercises Other Exercises: Passive ROM to bilat UEs, 1x10: gross grasp/release, wrist flex/ext, forearm pronation/supination, elbow flex/ext/abduct/adduct, shoulder IR/ER.  Small active efforts noted this date, but fatigues quickly and not adequate for functional activity.  No clinical indicators of pain noted. Other Exercises: L LE passive ROM, 1x10: ankle PF/DF, hip/knee flex/ext, hip abduct/adduct/IR/ER.  Facial grimacing with movement of L ankle Other Exercises: Opens eyes, tracks therapist and nods yes/no intermittently throughout session.      Assessment/Plan    PT Assessment Patient needs continued PT services  PT Diagnosis Difficulty walking;Generalized weakness;Acute pain;Hemiplegia non-dominant side;Hemiplegia dominant side   PT Problem List Decreased strength;Decreased range of motion;Decreased  activity tolerance;Decreased balance;Decreased mobility;Decreased coordination;Decreased cognition;Decreased knowledge of use of DME;Decreased safety awareness;Decreased knowledge of precautions;Cardiopulmonary status limiting activity;Impaired sensation;Decreased skin integrity;Pain  PT Treatment Interventions Therapeutic exercise;Manual techniques;Patient/family education;Therapeutic activities;Neuromuscular re-education;Cognitive remediation   PT Goals (Current goals can be  found in the Care Plan section) Acute Rehab PT Goals Patient Stated Goal: patient unable to verbalize/state goal PT Goal Formulation: Patient unable to participate in goal setting Time For Goal Achievement: 11/03/15 Potential to Achieve Goals: Poor    Frequency Min 2X/week   Barriers to discharge Decreased caregiver support      Co-evaluation               End of Session   Activity Tolerance: Patient limited by fatigue Patient left: in bed           Time: [id number] PT Time Calculation (min) (ACUTE ONLY): 20 min   Charges:   PT Evaluation $PT Re-evaluation: 1 Procedure PT Treatments $Therapeutic Exercise: 8-22 mins   PT G Codes:        Charlize Hathaway H. Manson PasseyBrown, PT, DPT, NCS 10/20/2015, 2:39 PM [phone]

## 2015-10-20 NOTE — Progress Notes (Signed)
[facility] Critical Care Medicine Progess Note  Name: Courtney GrinderHedda McMillian [name] MRN: [id number] DOB: Apr 03, 1966    ADMISSION DATE:  08/14/2015   INITIAL PRESENTATION:  49 AAF who has been in medical facilities (hosp, LTAC, rehab) for 2 yrs following gastric bypass surgery with multiple complications. Now with chronic trach, ESRD, profound debilitation, severe sacral pressure ulcer. Was seemingly making progress and transferred to rehab facility approx one week prior to this admission. [facility] with [facility] and hypotension. Working dx of severe sepsis/septic shock due to infected sacral pressure ulcer. Since admission. Her course is been very complicated with numerous complications including septic shock and GI bleeding.    INTERVAL HISTORY: The patient is arousable, but does not respond to my questions. She appears to be breathing comfortably on the ventilator, she is sleeping.    Summary of MAJOR EVENTS/TEST RESULTS: Admission 02/07/14-05/07/14 Admission 07/21/14-09/06/14 Discharged to [facility]. Pt had palliative consult at that time, were asked to sign off by husband.  09/23 CT head: NAD 09/23 EEG: no epileptiform activity 09/23 PRBCs for Hgb 6.4 09/24 bedside debridement of sacral wound. Abscess drained 09/25 Off vasopressors. More alert. No distress. Worsening thrombocytopenia. Vanc DC'd 09/29  Dr. [name] (I.D) excused from the case by patient's husband. 10/02 MRI -multiple infarcts 10/03 tracheal bleeding- transfused platelets 10/03 hospitalist service excused from the case by patient's husband 10/03 Echocardiogram ejection fraction was 55-60%, pulmonary systolic pressure was 39 mmHg 40/9810/08 restart TF's at lower rate, attempt reg HD  10/12 Transferred to med-surg floor. Remains on PCCM service 10/14 SLP eval: pt unable to tolerate PMV adequately 10/17-will re-attempt PM valve-discussed with Speech therapist 10/18 passed swallow eval-start pureed thick foods no thin  liquids-continue NG feeds 10/19 transferred to step down for sepsis/aspiration pneumonia 10/19 cxr shows RLL opacity 10/21 sacral decub debride at bedside by surgery 10/22 started back on vasopressors while on HD 10/27 CT with osteo, R hip fx, unable to identify tip of dubhoff tube - sent for fluoro study 10/28 Ortho consultation: I do not feel that she is a surgical candidate. Therefore, I feel that it would best to manage this fracture nonsurgically and allow it to heal by itself over time, which it should.  10/28 Gen Surg consultation: Due to the lack of free air or free flow of contrast and the peritoneum there is no indication for any surgical intervention on this. Would recommend pulling back the feeding tube 1-2 cm. Would recheck an abdominal film to confirm no pneumoperitoneum in the morning. Absence of any changes okay to continue using Dobbhoff for feeding and medications. 10/28 gastrograffin study: The study confirms that the feeding tube pes perforated through the duodenum and the tip is within a cavity that fills with injected contrast. The cavity does appear walled off 11/4 refuses oral feeds, continue TF's CT reviewed: T8-T9 discitis/osteomyelitis, RT HIP FRACTURE 11/5 placed back on vasopressors, placed back on Vent due to resp acidosis, levophed turned off 11/6 afternoon 11/5 PM Trach changed out due to cuff leak, #6 Shiley cuffed, 11/6 dubhoff occluded, removed and replaced, new dubhoff shows tube in the antral stomach 11/15 refusing to take oral feeds 11/18 placed back on Vent for increased WOB,SOB. 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 10/18/2015; the patient developed a new left basal pneumonia; with recurrent sepsis.  10/19/2015; fevers resolved with IV acetaminophen. Tube feeds changed from vital to Jevity.  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 Pneumoniae, P. Mirabilis, VRE Wound 9/26 >> No growth Blood 9/23 >> NEG CDiff 9/27>>neg Stool Cx 10/15>> negative Cdiff 10/25>>neg Trach Aspirate 10/28>> light growth pseudomonas Blood 10/28 >> 1/2 GPC >>  Sputum cultures obtained 09/22/15 due to mucus plugging>>Pseudomonas BONE TISSUE Cx 11/3>>E.coli (ESBL), k. Pneumonia (daptomycin and septra) Bld Cx 11/27>>negative thus far.  Trach Asp 11/27>>  ANTIMICROBIALS:  Aztreonam 9/23 >> 9/24 Vanc 9/23 >> 9/25 Vanc 9/26>>9/27 Daptomycin 9/27>> 10/12, 10/28>> off Meropenem 9/23 >> 10/14, 10/28 >> 10/31 Septra 11/8>> Zosyn 11/27>>off. Zosyn 11/29,>> 11/30. Levaquin 11/30>>  VITAL SIGNS: Temp:  [95.8 F (35.4 C)-99 F (37.2 C)] 97.9 F (36.6 C) (12/01 0400) Pulse Rate:  [87-106] 87 (12/01 0500) Resp:  [9-31] 23 (12/01 0700) BP: (79-157)/(34-138) 79/43 mmHg (12/01 0700) SpO2:  [93 %-100 %] 99 % (12/01 0842) FiO2 (%):  [35 %] 35 % (12/01 0846) HEMODYNAMICS:   VENTILATOR SETTINGS: Vent Mode:  [-] PRVC FiO2 (%):  [35 %] 35 % Set Rate:  [16 bmp] 16 bmp Vt Set:  [500 mL] 500 mL PEEP:  [5 cmH20] 5 cmH20 INTAKE / OUTPUT:  Intake/Output Summary (Last 24 hours) at 10/20/15 0916 Last data filed at 10/20/15 0600  Gross per 24 hour  Intake   1530 ml  Output   1000 ml  Net    530 ml   PHYSICAL EXAMINATION: Physical Examination:   VS: BP 79/43 mmHg  Pulse 87  Temp(Src) 97.9 F (36.6 C) (Axillary)  Resp 23  Ht  (1.753 m)  Wt 97 kg (213 lb 13.5 oz)  BMI 31.57 kg/m2  SpO2 99%  LMP  (LMP Unknown)  General Appearance: No resp distress, coarse upper airway sounds, on vent Neuro: profoundly diffusely  weak, alert today, nodding head HEENT: cushingoid facies, PERRLA, EOM intact Neck: trach site clean Pulmonary: clear anteriorly Cardiovascular: reg, + syst murmur Abdomen: soft, NT, +BS Extremities: warm, R>L UE edema    LABORATORY PANEL:   CBC  Recent Labs Lab 10/19/15 0515  WBC 12.0*  HGB 7.4*  HCT 24.0*  PLT 145*    Chemistries   Recent Labs Lab 10/17/15 1545  10/19/15 0515  NA 144  < > 142  K 4.7  < > 4.6  CL 101  < > 100*  CO2 33*  < > 33*  GLUCOSE 153*  < > 183*  BUN 93*  < > 76*  CREATININE 1.52*  < > 1.27*  CALCIUM 8.9  < > 8.5*  PHOS 3.2  --   --   < > = values in this interval not displayed.     IMPRESSION/PLAN (current major issues):  Recurrent sepsis, due to pneumonia.   Anemia with macrocytosis. -Uncertain if this is due to chronic slow GI bleed or simple critical care associated anemia. She has already receiving folate, thiamine, and multivitamin. Her anemia has not responded to oral medications, this may be due to her GI malabsorption.  Fever with sepsis. -Chest x-ray shows new left lower lobe infiltrate. Sputum cultures show pseudomonas. The patient is once again, vent dependent. She has developed again recurrent sepsis.Marland Kitchen Sputum cultures showing Pseudomonas, susceptibilities are  pending.  Worsening sacral decubitus ulcer, stage IV, now with worsening. -The patient is already had a sacral bone scraping, and has been placed on Septra for antibiotic, per ID recommendations. Her sacral decubitus does not appear to be improving and in fact appears to be worsening. In addition, she has several new smaller areas of tissue breakdown.  End-stage renal disease. -Continue on hemodialysis per nephrology.  Diarrhea. -This has improved since changing her loperamide 2 twice daily scheduled. In addition, we have changed the tube feeds to the previous tube feeds at the husband's request. Will decrease loperamide to when necessary.  Patient appears to be  stable for transfer to an LTAC facility at this point in time.   Additional Chronic issues and complicating factors during this admission.  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  Thrombocytopenia, resolved - HIT panel negative 9/25 Recurrent Severe sepsis, due to sacral pressure ulcer DM 2, controlled.  Chronic steroid therapy, for a history of of adrenal insufficiency Incidental finding of R hip fx - conservative mgmt. Waxing and waning encephalopathy - she was evaluated by both the psychiatry and neurology services, and it was deemed that the patient lacks decisional capacity at this time. 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 Husband declined TEE Profound deconditioning-criticall illness neuropathy Chronic pain   Husband updated at phone number [phone]. I informed him that the patient's diarrhea was improved and we could decrease the loperamide. Wells Guiles M.D.   Critical Care Attestation.  I have personally obtained a history, examined the patient, evaluated laboratory and imaging results, formulated the assessment and plan and placed orders. The case was discussed with the critical care RN, nutrition, respiratory therapist, Associate Professor. The Patient requires high complexity decision making for assessment and support, frequent evaluation and titration of therapies, application of advanced monitoring technologies and extensive interpretation of multiple databases. The patient has critical illness that could lead imminently to failure of 1 or more organ systems and requires the highest level of physician preparedness to intervene.  Critical Care Time devoted to patient care services described in this note is 35 minutes and is exclusive of time spent in procedures.

## 2015-10-20 NOTE — Progress Notes (Signed)
MEDICATION RELATED CONSULT NOTE - Follow up   Pharmacy Consult for Renal dosing  Allergies  Allergen Reactions  . Contrast Media [Iodinated Diagnostic Agents] Anaphylaxis  . Ampicillin Rash    Patient Measurements: Height: 5\' 9"  (175.3 cm) Weight:  (No bedscale) IBW/kg (Calculated) : 66.2  Vital Signs: Temp: 97.8 F (36.6 C) (12/01 0834) Temp Source: Axillary (12/01 0834) BP: 81/34 mmHg (12/01 1000) Pulse Rate: 87 (12/01 0500)  Recent Labs  10/17/15 1545 10/18/15 0640 10/19/15 0145 10/19/15 0515  WBC 13.5* 11.3*  --  12.0*  HGB 7.3* 6.6* 7.0* 7.4*  HCT 24.6* 21.8* 22.5* 24.0*  PLT 166 151  --  145*  CREATININE 1.52* 1.05*  --  1.27*  PHOS 3.2  --   --   --   ALBUMIN 2.2*  --   --   --    Estimated Creatinine Clearance: 65.7 mL/min (by C-G formula based on Cr of 1.27).   Assessment: 49 yo patient on HD. Pharmacy consulted for renal dosing of medications.   Plan:  No medications require adjustment at present. Pharmacy will continue to monitor patients medications and dose adjust as needed.    Cher NakaiSheema Sayde Lish, PharmD Pharmacy Resident 10/20/2015

## 2015-10-20 NOTE — Progress Notes (Signed)
ANTIBIOTIC CONSULT NOTE - INITIAL  Pharmacy Consult for Ceftazidime  Indication: pneumonia  Allergies  Allergen Reactions  . Contrast Media [Iodinated Diagnostic Agents] Anaphylaxis  . Ampicillin Rash   Patient Measurements: Height:  (175.3 cm) Weight:  (No bedscale) IBW/kg (Calculated) : 66.2  Vital Signs: Temp: 97.8 F (36.6 C) (12/01 0834) Temp Source: Axillary (12/01 0834) BP: 81/34 mmHg (12/01 1000) Pulse Rate: 87 (12/01 0500) Intake/Output from previous day: 11/30 0701 - 12/01 0700 In: 1780 [NG/GT:1580; IV Piggyback:200] Out: 1000    Recent Labs  10/17/15 1545 10/18/15 0640 10/19/15 0145 10/19/15 0515  WBC 13.5* 11.3*  --  12.0*  HGB 7.3* 6.6* 7.0* 7.4*  PLT 166 151  --  145*  CREATININE 1.52* 1.05*  --  1.27*   Estimated Creatinine Clearance: 65.7 mL/min (by C-G formula based on Cr of 1.27).   Microbiology: Recent Results (from the past 720 hour(s))  Culture, expectorated sputum-assessment     Status: None   Collection Time: 09/22/15  2:09 PM  Result Value Ref Range Status   Specimen Description ENDOTRACHEAL  Final   Special Requests Normal  Final   Sputum evaluation THIS SPECIMEN IS ACCEPTABLE FOR SPUTUM CULTURE  Final   Report Status 09/24/2015 FINAL  Final  Culture, respiratory (NON-Expectorated)     Status: None   Collection Time: 09/22/15  2:09 PM  Result Value Ref Range Status   Specimen Description ENDOTRACHEAL  Final   Special Requests Normal Reflexed from [id number]  Final   Gram Stain   Final    FEW WBC SEEN FEW GRAM NEGATIVE RODS POOR SPECIMEN - LESS THAN 70% WBCS    Culture   Final    MODERATE GROWTH PSEUDOMONAS AERUGINOSA LIGHT GROWTH KLEBSIELLA PNEUMONIAE REFER TO SENSITIVITIES FROM PREVIOUS CULTURE FOR ORG 2    Report Status 10/01/2015 FINAL  Final   Organism ID, Bacteria PSEUDOMONAS AERUGINOSA  Final   Organism ID, Bacteria KLEBSIELLA PNEUMONIAE  Final      Susceptibility   Pseudomonas aeruginosa - MIC*    CEFTAZIDIME 4  SENSITIVE Sensitive     CIPROFLOXACIN 2 INTERMEDIATE Intermediate     GENTAMICIN >=16 RESISTANT Resistant     IMIPENEM >=16 RESISTANT Resistant     PIP/TAZO Value in next row Sensitive      SENSITIVE16    * MODERATE GROWTH PSEUDOMONAS AERUGINOSA  Tissue culture     Status: None   Collection Time: 09/24/15  6:44 AM  Result Value Ref Range Status   Specimen Description BONE  Final   Special Requests Normal  Final   Gram Stain MODERATE WBC SEEN FEW GRAM NEGATIVE RODS   Final   Culture   Final    MODERATE GROWTH ESCHERICHIA COLI MODERATE GROWTH KLEBSIELLA PNEUMONIAE ESBL-EXTENDED SPECTRUM BETA LACTAMASE-THE ORGANISM IS RESISTANT TO PENICILLINS, CEPHALOSPORINS AND AZTREONAM ACCORDING TO CLSI [id number] VOL.25 N01 JAN 2005. ORGANISM 1 This organism isolate is resistant to one or more antiotic agents in three or more antimicrobial categories.  Suggest Infectious Disease consult.   ORGANISM 2 CRITICAL RESULT CALLED TO, READ BACK BY AND VERIFIED WITH: RN Mardene Celeste LINDSAY 09/27/15 1005AM    Report Status 09/28/2015 FINAL  Final   Organism ID, Bacteria ESCHERICHIA COLI  Final   Organism ID, Bacteria KLEBSIELLA PNEUMONIAE  Final      Susceptibility   Escherichia coli - MIC*    AMPICILLIN >=32 RESISTANT Resistant     CEFTAZIDIME 4 RESISTANT Resistant     CEFAZOLIN >=64 RESISTANT Resistant  CEFTRIAXONE 8 RESISTANT Resistant     GENTAMICIN <=1 SENSITIVE Sensitive     IMIPENEM 8 RESISTANT Resistant     TRIMETH/SULFA <=20 SENSITIVE Sensitive     Extended ESBL POSITIVE Resistant     PIP/TAZO Value in next row Resistant      RESISTANT>=128    * MODERATE GROWTH ESCHERICHIA COLI   Klebsiella pneumoniae - MIC*    AMPICILLIN Value in next row Resistant      RESISTANT>=128    CEFTAZIDIME Value in next row Resistant      RESISTANT>=128    CEFAZOLIN Value in next row Resistant      RESISTANT>=128    CEFTRIAXONE Value in next row Resistant      RESISTANT>=128    CIPROFLOXACIN Value in next row  Resistant      RESISTANT>=128    GENTAMICIN Value in next row Resistant      RESISTANT>=128    IMIPENEM Value in next row Resistant      RESISTANT>=128    TRIMETH/SULFA Value in next row Sensitive      RESISTANT>=128    PIP/TAZO Value in next row Resistant      RESISTANT>=128    * MODERATE GROWTH KLEBSIELLA PNEUMONIAE  Anaerobic culture     Status: None   Collection Time: 09/24/15  3:55 PM  Result Value Ref Range Status   Specimen Description BONE  Final   Special Requests Normal  Final   Culture NO ANAEROBES ISOLATED  Final   Report Status 09/29/2015 FINAL  Final  Culture, fungus without smear     Status: None   Collection Time: 09/24/15  3:55 PM  Result Value Ref Range Status   Specimen Description BONE  Final   Special Requests Normal  Final   Culture NO FUNGUS ISOLATED AFTER 24 DAYS  Final   Report Status 10/18/2015 FINAL  Final  C difficile quick scan w PCR reflex     Status: None   Collection Time: 10/07/15  2:02 PM  Result Value Ref Range Status   C Diff antigen NEGATIVE NEGATIVE Final   C Diff toxin NEGATIVE NEGATIVE Final   C Diff interpretation Negative for C. difficile  Final  Culture, blood (routine x 2)     Status: None (Preliminary result)   Collection Time: 10/16/15  9:52 AM  Result Value Ref Range Status   Specimen Description BLOOD LEFT HAND  Final   Special Requests   Final    BOTTLES DRAWN AEROBIC AND ANAEROBIC  AER 6CC ANA 4CC   Culture NO GROWTH 4 DAYS  Final   Report Status PENDING  Incomplete  Culture, blood (routine x 2)     Status: None (Preliminary result)   Collection Time: 10/16/15 12:25 PM  Result Value Ref Range Status   Specimen Description BLOOD LEFT HAND  Final   Special Requests BOTTLES DRAWN AEROBIC AND ANAEROBIC  5CC  Final   Culture NO GROWTH 4 DAYS  Final   Report Status PENDING  Incomplete  Culture, expectorated sputum-assessment     Status: None   Collection Time: 10/18/15  1:15 PM  Result Value Ref Range Status   Specimen  Description SPUTUM  Final   Special Requests NONE  Final   Sputum evaluation THIS SPECIMEN IS ACCEPTABLE FOR SPUTUM CULTURE  Final   Report Status 10/18/2015 FINAL  Final  Culture, respiratory (NON-Expectorated)     Status: None (Preliminary result)   Collection Time: 10/18/15  1:15 PM  Result Value Ref Range Status  Specimen Description SPUTUM  Final   Special Requests NONE Reflexed from [id number]  Final   Gram Stain   Final    GOOD SPECIMEN - 80-90% WBCS MODERATE WBC SEEN MANY GRAM NEGATIVE RODS    Culture   Final    HEAVY GROWTH PSEUDOMONAS AERUGINOSA MODERATE GROWTH GRAM NEGATIVE RODS IDENTIFICATION AND SUSCEPTIBILITIES TO FOLLOW    Report Status PENDING  Incomplete  C difficile quick scan w PCR reflex     Status: None   Collection Time: 10/18/15  4:39 PM  Result Value Ref Range Status   C Diff antigen NEGATIVE NEGATIVE Final   C Diff toxin NEGATIVE NEGATIVE Final   C Diff interpretation Negative for C. difficile  Final   Assessment: 49 yo female with ESRD receiving HD every MWF. Pharmacy consulted for dosing and monitoring of ceftazidime.   Patients sputum cultures showing heavy growth of Pseudomonas. Susceptibilities pending.  Susceptibilities from 10/01/15 shows sensitivity to ceftazidime and Zosyn. Patient has ampicillin allergy.   Plan:  Will start patient on Ceftazidime 1g every 24 hours for HD dosing.  Pharmacy will continue to monitor labs and susceptibilities.  Cher NakaiSheema Marialy Urbanczyk, PharmD Pharmacy Resident 10/20/2015 11:56 AM

## 2015-10-20 NOTE — Progress Notes (Signed)
Subjective:  Patient remains critically ill at this point in time. Had hemodialysis yesterday. You have achieved was 1 kg. Blood pressure noted as being low this a.m.    Objective:  Vital signs in last 24 hours:  Temp:  [95.8 F (35.4 C)-99 F (37.2 C)] 97.9 F (36.6 C) (12/01 0400) Pulse Rate:  [87-106] 87 (12/01 0500) Resp:  [9-31] 23 (12/01 0700) BP: (79-157)/(34-138) 79/43 mmHg (12/01 0700) SpO2:  [93 %-100 %] 99 % (12/01 0842) FiO2 (%):  [35 %] 35 % (12/01 0846)  Weight change:  Filed Weights    Intake/Output: I/O last 3 completed shifts: In: 2540 [I.V.:10; NG/GT:2130; IV Piggyback:400] Out: 1000 [Other:1000]   Intake/Output this shift:     Physical Exam: General: No acute distress, critically ill appearing  Head: NG in place, OM moist  Eyes: anicteric  Neck: Tracheostomy in place  Lungs:  Scattered rhonchi, vent assisted fio2 35%, tachypneic  Heart: S1S2 +tachycardia  Abdomen:  Soft, nontender  Extremities: 1+ dependent edema, anasarca  Neurologic: Lethargic but arousable  Access:  R IJ permcath.       Basic Metabolic Panel:  Recent Labs Lab 10/14/15 0514 10/15/15 1223 10/16/15 0625 10/17/15 1545 10/18/15 0640 10/19/15 0515  NA 145  --  144 144 143 142  K 4.2  --  4.2 4.7 4.1 4.6  CL 105  --  102 101 102 100*  CO2 32  --  32 33* 32 33*  GLUCOSE 109*  --  166* 153* 126* 183*  BUN 74*  --  66* 93* 57* 76*  CREATININE 1.20*  --  1.22* 1.52* 1.05* 1.27*  CALCIUM 8.5*  --  8.7* 8.9 8.5* 8.5*  PHOS  --  1.9* 2.2* 3.2  --   --     Liver Function Tests:  Recent Labs Lab 10/17/15 1545  ALBUMIN 2.2*   No results for input(s): LIPASE, AMYLASE in the last 168 hours. No results for input(s): AMMONIA in the last 168 hours.  CBC:  Recent Labs Lab 10/14/15 0514 10/16/15 0625 10/17/15 1545 10/18/15 0640 10/19/15 0145 10/19/15 0515  WBC 12.3* 11.6* 13.5* 11.3*  --  12.0*  NEUTROABS 10.9* 9.9*  --   --   --   --   HGB 7.5* 7.4* 7.3* 6.6*  7.0* 7.4*  HCT 25.0* 24.6* 24.6* 21.8* 22.5* 24.0*  MCV 101.6* 101.1* 101.5* 100.4*  --  96.6  PLT 183 169 166 151  --  145*    Cardiac Enzymes: No results for input(s): CKTOTAL, CKMB, CKMBINDEX, TROPONINI in the last 168 hours.  BNP: Invalid input(s): POCBNP  CBG:  Recent Labs Lab 10/19/15 1725 10/19/15 1941 10/20/15 0019 10/20/15 0354 10/20/15 0727  GLUCAP 120* 141* 93 114* 117*    Microbiology: Results for orders placed or performed during the hospital encounter of 08/05/2015  Blood Culture (routine x 2)     Status: None   Collection Time: 08/18/2015  8:51 AM  Result Value Ref Range Status   Specimen Description BLOOD [name]  Final   Special Requests BOTTLES DRAWN AEROBIC AND ANAEROBIC  3CC  Final   Culture NO GROWTH 5 DAYS  Final   Report Status 08/17/2015 FINAL  Final  Blood Culture (routine x 2)     Status: None   Collection Time: 08/03/2015  9:20 AM  Result Value Ref Range Status   Specimen Description BLOOD LEFT ARM  Final   Special Requests BOTTLES DRAWN AEROBIC AND ANAEROBIC  1CC  Final   Culture  NO GROWTH 5 DAYS  Final   Report Status 08/17/2015 FINAL  Final  Wound culture     Status: None   Collection Time: 08/09/2015  9:20 AM  Result Value Ref Range Status   Specimen Description DECUBITIS  Final   Special Requests Normal  Final   Gram Stain   Final    FEW WBC SEEN MANY GRAM NEGATIVE RODS RARE GRAM POSITIVE COCCI    Culture   Final    HEAVY GROWTH ESCHERICHIA COLI MODERATE GROWTH ENTEROBACTER AEROGENES PROTEUS MIRABILIS HEAVY GROWTH ENTEROCOCCUS SPECIES VRE HAVE INTRINSIC RESISTANCE TO MOST COMMONLY USED ANTIBIOTICS AND THE ABILITY TO ACQUIRE RESISTANCE TO MOST AVAILABLE ANTIBIOTICS.    Report Status 08/16/2015 FINAL  Final   Organism ID, Bacteria ESCHERICHIA COLI  Final   Organism ID, Bacteria ENTEROBACTER AEROGENES  Final   Organism ID, Bacteria PROTEUS MIRABILIS  Final   Organism ID, Bacteria ENTEROCOCCUS SPECIES  Final      Susceptibility    Enterobacter aerogenes - MIC*    CEFTAZIDIME <=1 SENSITIVE Sensitive     CEFAZOLIN >=64 RESISTANT Resistant     CEFTRIAXONE <=1 SENSITIVE Sensitive     CIPROFLOXACIN <=0.25 SENSITIVE Sensitive     GENTAMICIN <=1 SENSITIVE Sensitive     IMIPENEM 1 SENSITIVE Sensitive     TRIMETH/SULFA <=20 SENSITIVE Sensitive     * MODERATE GROWTH ENTEROBACTER AEROGENES   Escherichia coli - MIC*    AMPICILLIN <=2 SENSITIVE Sensitive     CEFTAZIDIME <=1 SENSITIVE Sensitive     CEFAZOLIN <=4 SENSITIVE Sensitive     CEFTRIAXONE <=1 SENSITIVE Sensitive     CIPROFLOXACIN <=0.25 SENSITIVE Sensitive     GENTAMICIN <=1 SENSITIVE Sensitive     IMIPENEM <=0.25 SENSITIVE Sensitive     TRIMETH/SULFA <=20 SENSITIVE Sensitive     * HEAVY GROWTH ESCHERICHIA COLI   Proteus mirabilis - MIC*    AMPICILLIN >=32 RESISTANT Resistant     CEFTAZIDIME <=1 SENSITIVE Sensitive     CEFAZOLIN 8 SENSITIVE Sensitive     CEFTRIAXONE <=1 SENSITIVE Sensitive     CIPROFLOXACIN <=0.25 SENSITIVE Sensitive     GENTAMICIN <=1 SENSITIVE Sensitive     IMIPENEM 1 SENSITIVE Sensitive     TRIMETH/SULFA <=20 SENSITIVE Sensitive     * PROTEUS MIRABILIS   Enterococcus species - MIC*    AMPICILLIN >=32 RESISTANT Resistant     VANCOMYCIN >=32 RESISTANT Resistant     GENTAMICIN SYNERGY SENSITIVE Sensitive     TETRACYCLINE Value in next row Resistant      RESISTANT>=16    * HEAVY GROWTH ENTEROCOCCUS SPECIES  MRSA PCR Screening     Status: None   Collection Time: 08/11/2015  2:38 PM  Result Value Ref Range Status   MRSA by PCR NEGATIVE NEGATIVE Final    Comment:        The GeneXpert MRSA Assay (FDA approved for NASAL specimens only), is one component of a comprehensive MRSA colonization surveillance program. It is not intended to diagnose MRSA infection nor to guide or monitor treatment for MRSA infections.   Blood culture (single)     Status: None   Collection Time: 07/26/2015  3:36 PM  Result Value Ref Range Status   Specimen  Description BLOOD RIGHT ASSIST CONTROL  Final   Special Requests BOTTLES DRAWN AEROBIC AND ANAEROBIC 5ML  Final   Culture NO GROWTH 5 DAYS  Final   Report Status 08/17/2015 FINAL  Final  Wound culture     Status: None  Collection Time: 08/13/15 12:37 PM  Result Value Ref Range Status   Specimen Description WOUND  Final   Special Requests Normal  Final   Gram Stain   Final    FEW WBC SEEN TOO NUMEROUS TO COUNT GRAM NEGATIVE RODS FEW GRAM POSITIVE COCCI    Culture   Final    HEAVY GROWTH ESCHERICHIA COLI MODERATE GROWTH PROTEUS MIRABILIS LIGHT GROWTH KLEBSIELLA PNEUMONIAE MODERATE GROWTH ENTEROCOCCUS GALLINARUM CRITICAL RESULT CALLED TO, READ BACK BY AND VERIFIED WITH: [facility] BORBA AT 1042 08/16/15 DV    Report Status 08/17/2015 FINAL  Final   Organism ID, Bacteria ESCHERICHIA COLI  Final   Organism ID, Bacteria PROTEUS MIRABILIS  Final   Organism ID, Bacteria KLEBSIELLA PNEUMONIAE  Final   Organism ID, Bacteria ENTEROCOCCUS GALLINARUM  Final      Susceptibility   Escherichia coli - MIC*    AMPICILLIN >=32 RESISTANT Resistant     CEFTAZIDIME 4 RESISTANT Resistant     CEFAZOLIN >=64 RESISTANT Resistant     CEFTRIAXONE 16 RESISTANT Resistant     GENTAMICIN 2 SENSITIVE Sensitive     IMIPENEM >=16 RESISTANT Resistant     TRIMETH/SULFA <=20 SENSITIVE Sensitive     Extended ESBL POSITIVE Resistant     PIP/TAZO Value in next row Resistant      RESISTANT>=128    CIPROFLOXACIN Value in next row Sensitive      SENSITIVE<=0.25    * HEAVY GROWTH ESCHERICHIA COLI   Klebsiella pneumoniae - MIC*    AMPICILLIN Value in next row Resistant      SENSITIVE<=0.25    CEFTAZIDIME Value in next row Resistant      SENSITIVE<=0.25    CEFAZOLIN Value in next row Resistant      SENSITIVE<=0.25    CEFTRIAXONE Value in next row Resistant      SENSITIVE<=0.25    CIPROFLOXACIN Value in next row Resistant      SENSITIVE<=0.25    GENTAMICIN Value in next row Sensitive      SENSITIVE<=0.25     IMIPENEM Value in next row Resistant      SENSITIVE<=0.25    TRIMETH/SULFA Value in next row Resistant      SENSITIVE<=0.25    PIP/TAZO Value in next row Resistant      RESISTANT>=128    * LIGHT GROWTH KLEBSIELLA PNEUMONIAE   Proteus mirabilis - MIC*    AMPICILLIN Value in next row Resistant      RESISTANT>=128    CEFTAZIDIME Value in next row Sensitive      RESISTANT>=128    CEFAZOLIN Value in next row Sensitive      RESISTANT>=128    CEFTRIAXONE Value in next row Sensitive      RESISTANT>=128    CIPROFLOXACIN Value in next row Sensitive      RESISTANT>=128    GENTAMICIN Value in next row Sensitive      RESISTANT>=128    IMIPENEM Value in next row Sensitive      RESISTANT>=128    TRIMETH/SULFA Value in next row Sensitive      RESISTANT>=128    PIP/TAZO Value in next row Sensitive      SENSITIVE<=4    * MODERATE GROWTH PROTEUS MIRABILIS   Enterococcus gallinarum - MIC*    AMPICILLIN Value in next row Resistant      SENSITIVE<=4    GENTAMICIN SYNERGY Value in next row Sensitive      SENSITIVE<=4    CIPROFLOXACIN Value in next row Resistant  RESISTANT>=8    TETRACYCLINE Value in next row Resistant      RESISTANT>=16    * MODERATE GROWTH ENTEROCOCCUS GALLINARUM  Wound culture     Status: None   Collection Time: 08/15/15  2:53 PM  Result Value Ref Range Status   Specimen Description WOUND  Final   Special Requests NONE  Final   Gram Stain FEW WBC SEEN NO ORGANISMS SEEN   Final   Culture NO GROWTH 3 DAYS  Final   Report Status 08/18/2015 FINAL  Final  C difficile quick scan w PCR reflex     Status: None   Collection Time: 08/17/15 11:34 AM  Result Value Ref Range Status   C Diff antigen NEGATIVE NEGATIVE Final   C Diff toxin NEGATIVE NEGATIVE Final   C Diff interpretation Negative for C. difficile  Final  Stool culture     Status: None   Collection Time: 09/03/15  3:51 PM  Result Value Ref Range Status   Specimen Description STOOL  Final   Special Requests  Immunocompromised  Final   Culture   Final    NO SALMONELLA OR SHIGELLA ISOLATED No Pathogenic E. coli detected NO CAMPYLOBACTER DETECTED    Report Status 09/07/2015 FINAL  Final  C difficile quick scan w PCR reflex     Status: None   Collection Time: 09/13/15 12:51 AM  Result Value Ref Range Status   C Diff antigen NEGATIVE NEGATIVE Final   C Diff toxin NEGATIVE NEGATIVE Final   C Diff interpretation Negative for C. difficile  Final  Culture, blood (routine x 2)     Status: None   Collection Time: 09/16/15 11:24 AM  Result Value Ref Range Status   Specimen Description BLOOD LEFT HAND  Final   Special Requests BOTTLES DRAWN AEROBIC AND ANAEROBIC  1CC  Final   Culture NO GROWTH 5 DAYS  Final   Report Status 09/21/2015 FINAL  Final  Culture, blood (routine x 2)     Status: None   Collection Time: 09/16/15 12:23 PM  Result Value Ref Range Status   Specimen Description BLOOD RIGHT HAND  Final   Special Requests BOTTLES DRAWN AEROBIC AND ANAEROBIC  1CC  Final   Culture  Setup Time   Final    GRAM POSITIVE COCCI AEROBIC BOTTLE ONLY CRITICAL RESULT CALLED TO, READ BACK BY AND VERIFIED WITH: TESS THOMAS,RN 09/17/2015 0631 BY JRS.    Culture   Final    ENTEROCOCCUS FAECALIS AEROBIC BOTTLE ONLY VRE HAVE INTRINSIC RESISTANCE TO MOST COMMONLY USED ANTIBIOTICS AND THE ABILITY TO ACQUIRE RESISTANCE TO MOST AVAILABLE ANTIBIOTICS. CRITICAL RESULT CALLED TO, READ BACK BY AND VERIFIED WITH: CHERYL SMITH AT 1610 09/19/15 DV    Report Status 09/21/2015 FINAL  Final   Organism ID, Bacteria ENTEROCOCCUS FAECALIS  Final      Susceptibility   Enterococcus faecalis - MIC*    AMPICILLIN <=2 SENSITIVE Sensitive     LINEZOLID 2 SENSITIVE Sensitive     CIPROFLOXACIN Value in next row Resistant      RESISTANT>=8    TETRACYCLINE Value in next row Resistant      RESISTANT>=16    VANCOMYCIN Value in next row Resistant      RESISTANT>=32    GENTAMICIN SYNERGY Value in next row Resistant       RESISTANT>=32    * ENTEROCOCCUS FAECALIS  Culture, respiratory (NON-Expectorated)     Status: None   Collection Time: 09/16/15  3:50 PM  Result Value Ref Range  Status   Specimen Description TRACHEAL ASPIRATE  Final   Special Requests Immunocompromised  Final   Gram Stain   Final    FEW WBC SEEN GOOD SPECIMEN - 80-90% WBCS RARE GRAM NEGATIVE RODS    Culture LIGHT GROWTH PSEUDOMONAS AERUGINOSA  Final   Report Status 09/19/2015 FINAL  Final   Organism ID, Bacteria PSEUDOMONAS AERUGINOSA  Final      Susceptibility   Pseudomonas aeruginosa - MIC*    CEFTAZIDIME 8 SENSITIVE Sensitive     CIPROFLOXACIN 2 INTERMEDIATE Intermediate     GENTAMICIN >=16 RESISTANT Resistant     IMIPENEM >=16 RESISTANT Resistant     PIP/TAZO Value in next row Sensitive      SENSITIVE32    CEFEPIME Value in next row Sensitive      SENSITIVE8    LEVOFLOXACIN Value in next row Resistant      RESISTANT>=8    * LIGHT GROWTH PSEUDOMONAS AERUGINOSA  Culture, expectorated sputum-assessment     Status: None   Collection Time: 09/22/15  2:09 PM  Result Value Ref Range Status   Specimen Description ENDOTRACHEAL  Final   Special Requests Normal  Final   Sputum evaluation THIS SPECIMEN IS ACCEPTABLE FOR SPUTUM CULTURE  Final   Report Status 09/24/2015 FINAL  Final  Culture, respiratory (NON-Expectorated)     Status: None   Collection Time: 09/22/15  2:09 PM  Result Value Ref Range Status   Specimen Description ENDOTRACHEAL  Final   Special Requests Normal Reflexed from [id number]  Final   Gram Stain   Final    FEW WBC SEEN FEW GRAM NEGATIVE RODS POOR SPECIMEN - LESS THAN 70% WBCS    Culture   Final    MODERATE GROWTH PSEUDOMONAS AERUGINOSA LIGHT GROWTH KLEBSIELLA PNEUMONIAE REFER TO SENSITIVITIES FROM PREVIOUS CULTURE FOR ORG 2    Report Status 10/01/2015 FINAL  Final   Organism ID, Bacteria PSEUDOMONAS AERUGINOSA  Final   Organism ID, Bacteria KLEBSIELLA PNEUMONIAE  Final      Susceptibility   Pseudomonas  aeruginosa - MIC*    CEFTAZIDIME 4 SENSITIVE Sensitive     CIPROFLOXACIN 2 INTERMEDIATE Intermediate     GENTAMICIN >=16 RESISTANT Resistant     IMIPENEM >=16 RESISTANT Resistant     PIP/TAZO Value in next row Sensitive      SENSITIVE16    * MODERATE GROWTH PSEUDOMONAS AERUGINOSA  Tissue culture     Status: None   Collection Time: 09/24/15  6:44 AM  Result Value Ref Range Status   Specimen Description BONE  Final   Special Requests Normal  Final   Gram Stain MODERATE WBC SEEN FEW GRAM NEGATIVE RODS   Final   Culture   Final    MODERATE GROWTH ESCHERICHIA COLI MODERATE GROWTH KLEBSIELLA PNEUMONIAE ESBL-EXTENDED SPECTRUM BETA LACTAMASE-THE ORGANISM IS RESISTANT TO PENICILLINS, CEPHALOSPORINS AND AZTREONAM ACCORDING TO CLSI M100-S15 VOL.25 N01 JAN 2005. ORGANISM 1 This organism isolate is resistant to one or more antiotic agents in three or more antimicrobial categories.  Suggest Infectious Disease consult.   ORGANISM 2 CRITICAL RESULT CALLED TO, READ BACK BY AND VERIFIED WITH: RN Mardene Celeste LINDSAY 09/27/15 1005AM    Report Status 09/28/2015 FINAL  Final   Organism ID, Bacteria ESCHERICHIA COLI  Final   Organism ID, Bacteria KLEBSIELLA PNEUMONIAE  Final      Susceptibility   Escherichia coli - MIC*    AMPICILLIN >=32 RESISTANT Resistant     CEFTAZIDIME 4 RESISTANT Resistant     CEFAZOLIN >=64 RESISTANT  Resistant     CEFTRIAXONE 8 RESISTANT Resistant     GENTAMICIN <=1 SENSITIVE Sensitive     IMIPENEM 8 RESISTANT Resistant     TRIMETH/SULFA <=20 SENSITIVE Sensitive     Extended ESBL POSITIVE Resistant     PIP/TAZO Value in next row Resistant      RESISTANT>=128    * MODERATE GROWTH ESCHERICHIA COLI   Klebsiella pneumoniae - MIC*    AMPICILLIN Value in next row Resistant      RESISTANT>=128    CEFTAZIDIME Value in next row Resistant      RESISTANT>=128    CEFAZOLIN Value in next row Resistant      RESISTANT>=128    CEFTRIAXONE Value in next row Resistant      RESISTANT>=128     CIPROFLOXACIN Value in next row Resistant      RESISTANT>=128    GENTAMICIN Value in next row Resistant      RESISTANT>=128    IMIPENEM Value in next row Resistant      RESISTANT>=128    TRIMETH/SULFA Value in next row Sensitive      RESISTANT>=128    PIP/TAZO Value in next row Resistant      RESISTANT>=128    * MODERATE GROWTH KLEBSIELLA PNEUMONIAE  Anaerobic culture     Status: None   Collection Time: 09/24/15  3:55 PM  Result Value Ref Range Status   Specimen Description BONE  Final   Special Requests Normal  Final   Culture NO ANAEROBES ISOLATED  Final   Report Status 09/29/2015 FINAL  Final  Culture, fungus without smear     Status: None   Collection Time: 09/24/15  3:55 PM  Result Value Ref Range Status   Specimen Description BONE  Final   Special Requests Normal  Final   Culture NO FUNGUS ISOLATED AFTER 24 DAYS  Final   Report Status 10/18/2015 FINAL  Final  C difficile quick scan w PCR reflex     Status: None   Collection Time: 10/07/15  2:02 PM  Result Value Ref Range Status   C Diff antigen NEGATIVE NEGATIVE Final   C Diff toxin NEGATIVE NEGATIVE Final   C Diff interpretation Negative for C. difficile  Final  Culture, blood (routine x 2)     Status: None (Preliminary result)   Collection Time: 10/16/15  9:52 AM  Result Value Ref Range Status   Specimen Description BLOOD LEFT HAND  Final   Special Requests   Final    BOTTLES DRAWN AEROBIC AND ANAEROBIC  AER 6CC ANA 4CC   Culture NO GROWTH 4 DAYS  Final   Report Status PENDING  Incomplete  Culture, blood (routine x 2)     Status: None (Preliminary result)   Collection Time: 10/16/15 12:25 PM  Result Value Ref Range Status   Specimen Description BLOOD LEFT HAND  Final   Special Requests BOTTLES DRAWN AEROBIC AND ANAEROBIC  5CC  Final   Culture NO GROWTH 4 DAYS  Final   Report Status PENDING  Incomplete  Culture, expectorated sputum-assessment     Status: None   Collection Time: 10/18/15  1:15 PM  Result Value  Ref Range Status   Specimen Description SPUTUM  Final   Special Requests NONE  Final   Sputum evaluation THIS SPECIMEN IS ACCEPTABLE FOR SPUTUM CULTURE  Final   Report Status 10/18/2015 FINAL  Final  Culture, respiratory (NON-Expectorated)     Status: None (Preliminary result)   Collection Time: 10/18/15  1:15 PM  Result Value Ref Range Status   Specimen Description SPUTUM  Final   Special Requests NONE Reflexed from [id number]  Final   Gram Stain   Final    GOOD SPECIMEN - 80-90% WBCS MODERATE WBC SEEN MANY GRAM NEGATIVE RODS    Culture   Final    HEAVY GROWTH PSEUDOMONAS AERUGINOSA MODERATE GROWTH GRAM NEGATIVE RODS IDENTIFICATION AND SUSCEPTIBILITIES TO FOLLOW    Report Status PENDING  Incomplete  C difficile quick scan w PCR reflex     Status: None   Collection Time: 10/18/15  4:39 PM  Result Value Ref Range Status   C Diff antigen NEGATIVE NEGATIVE Final   C Diff toxin NEGATIVE NEGATIVE Final   C Diff interpretation Negative for C. difficile  Final    Coagulation Studies: No results for input(s): LABPROT, INR in the last 72 hours.  Urinalysis: No results for input(s): COLORURINE, LABSPEC, PHURINE, GLUCOSEU, HGBUR, BILIRUBINUR, KETONESUR, PROTEINUR, UROBILINOGEN, NITRITE, LEUKOCYTESUR in the last 72 hours.  Invalid input(s): APPERANCEUR    Imaging: Dg Chest 1 View  10/19/2015  CLINICAL DATA:  Shortness of Breath EXAM: CHEST 1 VIEW COMPARISON:  October 18, 2015 FINDINGS: Tracheostomy catheter tip is 6.4 cm above the carina. Right-sided central catheter tip is just beyond the cavoatrial junction in the right atrium. The left-sided central catheter tip is in the superior vena cava. Feeding tube tip is not seen; feeding tube tip is below the diaphragm. No pneumothorax. There is stable cardiomegaly with pulmonary venous hypertension. There is interstitial edema, more on the left than on the right, stable. There is patchy consolidation in the left lower lobe, stable. No new  opacity. No adenopathy. IMPRESSION: Evidence of a degree of congestive heart failure. Question superimposed pneumonia left lower lobe. No new opacity. No change in cardiac silhouette. Tube and catheter positions as described. Electronically Signed   By: Bretta Bang III M.D.   On: 10/19/2015 07:17     Medications:   . feeding supplement (JEVITY 1.5 CAL/FIBER) 1,000 mL (10/19/15 1129)  . norepinephrine Stopped (10/08/15 0530)   . amantadine  200 mg Per Tube Q7 days  . antiseptic oral rinse  7 mL Mouth Rinse QID  . aspirin  81 mg Oral Daily  . chlorhexidine gluconate  15 mL Mouth Rinse BID  . epoetin (EPOGEN/PROCRIT) injection  10,000 Units Intravenous Q M,W,F-HD  . escitalopram  10 mg Oral Daily  . feeding supplement (PRO-STAT SUGAR FREE 64)  30 mL Oral 6 X Daily  . folic acid  1 mg Oral Daily  . free water  20 mL Per Tube 6 times per day  . heparin subcutaneous  5,000 Units Subcutaneous Q12H  . hydrocortisone  10 mg Per Tube BID  . insulin aspart  0-9 Units Subcutaneous 6 times per day  . ipratropium-albuterol  3 mL Nebulization QID  . levofloxacin (LEVAQUIN) IV  500 mg Intravenous Q48H  . lidocaine  1 patch Transdermal Q24H  . loperamide  2 mg Oral BID  . midodrine  5 mg Per Tube TID WC  . multivitamin  5 mL Oral Daily  . pantoprazole sodium  40 mg Per Tube Daily  . saccharomyces boulardii  250 mg Oral BID  . sodium chloride  10-40 mL Intracatheter Q12H  . sodium hypochlorite   Irrigation BID  . sulfamethoxazole-trimethoprim  2.5 tablet Per Tube q1800  . thiamine  100 mg Per Tube Daily   sodium chloride, sodium chloride, acetaminophen (TYLENOL) oral liquid 160 mg/5 mL, HYDROmorphone (DILAUDID)  injection, morphine injection, ondansetron (ZOFRAN) IV, oxyCODONE, sodium chloride, zinc oxide  Assessment/ Plan:  49 y.o. 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 Admitted on August 15, 2015  1. End-stage renal disease on hemodialysis on HD MWF. The patient has been on dialysis since October of 2014. R IJ permcath. Dialysis treatment with IV albumin for oncotic support.  -   patient completed dialysis yesterday. No acute indication for dialysis today. We will plan for analysis in tomorrow with ultrafiltration target of 1-1.5 kg.   2. Anemia of CKDhemoglobin was 7.4 at last check. We will maintain the patient on Epogen and transfuse as necessary. .   3. Secondary hyperparathyroidism: PTH low at 55 (9/25) - consider checking phosphorus tomorrow.    4. Sepsis: VRE in blood. Bone cultures E. Coli and klebsiella - Treatment as per ID and ICU team: daptomycin completed on 11/10 and now on trimethoprim/sulfamethoxazole and levaquin. -It appears that Pseudomonas is growing in the sputum. Management per ID and pulmonary critical care.  5. Generalized Dependent edema/Anasarca due to 3rd spacing -  UF target 1kg.   6. Acute resp failure - New infiltrate was noted in the left lower lobe on Xray. -Pseudomonas noted in the sputum.  -Continue ventilator support.     LOS: 69 Hadja Harral 12/1/20169:13 AM

## 2015-10-21 LAB — CBC
HEMATOCRIT: 22.2 % — AB (ref 35.0–47.0)
Hemoglobin: 6.7 g/dL — ABNORMAL LOW (ref 12.0–16.0)
MCH: 29.4 pg (ref 26.0–34.0)
MCHC: 30.4 g/dL — AB (ref 32.0–36.0)
MCV: 96.7 fL (ref 80.0–100.0)
Platelets: 172 10*3/uL (ref 150–440)
RBC: 2.29 MIL/uL — ABNORMAL LOW (ref 3.80–5.20)
RDW: 18.5 % — AB (ref 11.5–14.5)
WBC: 11.4 10*3/uL — ABNORMAL HIGH (ref 3.6–11.0)

## 2015-10-21 LAB — RENAL FUNCTION PANEL
Albumin: 2 g/dL — ABNORMAL LOW (ref 3.5–5.0)
Anion gap: 8 (ref 5–15)
BUN: 69 mg/dL — AB (ref 6–20)
CHLORIDE: 97 mmol/L — AB (ref 101–111)
CO2: 33 mmol/L — AB (ref 22–32)
Calcium: 8.3 mg/dL — ABNORMAL LOW (ref 8.9–10.3)
Creatinine, Ser: 1.06 mg/dL — ABNORMAL HIGH (ref 0.44–1.00)
GFR calc Af Amer: >60 mL/min (ref >60)
GFR calc non Af Amer: >60 mL/min (ref >60)
GLUCOSE: 139 mg/dL — AB (ref 65–99)
POTASSIUM: 4.8 mmol/L (ref 3.5–5.1)
Phosphorus: 3.6 mg/dL (ref 2.5–4.6)
Sodium: 138 mmol/L (ref 135–145)

## 2015-10-21 LAB — GLUCOSE, CAPILLARY
GLUCOSE-CAPILLARY: 156 mg/dL — AB (ref 65–99)
GLUCOSE-CAPILLARY: 129 mg/dL — AB (ref 65–99)
GLUCOSE-CAPILLARY: 140 mg/dL — AB (ref 65–99)
Glucose-Capillary: 135 mg/dL — ABNORMAL HIGH (ref 65–99)
Glucose-Capillary: 132 mg/dL — ABNORMAL HIGH (ref 65–99)
Glucose-Capillary: 104 mg/dL — ABNORMAL HIGH (ref 65–99)

## 2015-10-21 LAB — HEMOGLOBIN AND HEMATOCRIT, BLOOD
HCT: 26.1 % — ABNORMAL LOW (ref 35.0–47.0)
Hemoglobin: 8.2 g/dL — ABNORMAL LOW (ref 12.0–16.0)

## 2015-10-21 LAB — PHOSPHORUS: PHOSPHORUS: 3.5 mg/dL (ref 2.5–4.6)

## 2015-10-21 LAB — PREPARE RBC (CROSSMATCH)

## 2015-10-21 MED ORDER — SODIUM CHLORIDE 0.9 % IV SOLN
Freq: Once | INTRAVENOUS | Status: AC
  Administered 2015-10-21: 21:00:00 via INTRAVENOUS

## 2015-10-21 MED ORDER — ALBUMIN HUMAN 25 % IV SOLN
25.0000 g | Freq: Once | INTRAVENOUS | Status: AC
  Administered 2015-10-21: 25 g via INTRAVENOUS
  Filled 2015-10-21: qty 100

## 2015-10-21 NOTE — Care Management (Signed)
Have spoken with [facility] and unable to consider for admission due to lack of specific treatment goals.  [facility]- faxed some initial information. [facility] [place].  [facility]   [facility] [place].  [facility]The medical directors- Dr Anastasio Auerbachhang and Dr Lorenza CambridgeSyphard continue to follow.  Updated her on bed search for [facility].  Discussed that it is very difficult to identify specific treatment goals for this patient.  Discussed that CM will continue to search for facility that may better meet the patient's needs.  Currently she is afebrile , remains on vent.  Discussion regarding reimaging of right hip to evaluate healing of fracture. This may enable physical therapy to advance treatment if is healing sufficiently.   Have discussed with attending of need to proceed with finding facility/physician to pursue gtube but patient has not been able to maintain a period of stability.

## 2015-10-21 NOTE — Progress Notes (Signed)
Subjective:  Critical illness persist this a.m. Next time patient remains on the ventilator with FiO2 of 35%. Patient remains tachypnea Present. She is due for hemodialysis today and orders have been prepared. Ultrafiltration target will be 1 kg.  Objective:  Vital signs in last 24 hours:  Temp:  [97.7 F (36.5 C)-98.8 F (37.1 C)] 97.7 F (36.5 C) (12/02 0745) Pulse Rate:  [87-98] 95 (12/02 0700) Resp:  [18-31] 31 (12/02 0700) BP: (63-107)/(34-65) 106/50 mmHg (12/02 0700) SpO2:  [92 %-100 %] 100 % (12/02 0700) FiO2 (%):  [35 %] 35 % (12/02 0402)  Weight change:  Filed Weights    Intake/Output: I/O last 3 completed shifts: In: 2290 [I.V.:20; NG/GT:2220; IV Piggyback:50] Out: -    Intake/Output this shift:     Physical Exam: General: No acute distress, critically ill appearing  Head: NG in place, OM moist  Eyes: anicteric  Neck: Tracheostomy in place  Lungs:  Scattered rhonchi, vent assisted fio2 35%, tachypneic  Heart: S1S2 +tachycardia  Abdomen:  Soft, nontender  Extremities: 1+ dependent edema, anasarca  Neurologic: Awake, not consistently following commands  Access:  R IJ permcath.       Basic Metabolic Panel:  Recent Labs Lab 10/15/15 1223  10/16/15 0625 10/17/15 1545 10/18/15 0640 10/19/15 0515  NA  --   --  144 144 143 142  K  --   --  4.2 4.7 4.1 4.6  CL  --   --  102 101 102 100*  CO2  --   --  32 33* 32 33*  GLUCOSE  --   --  166* 153* 126* 183*  BUN  --   --  66* 93* 57* 76*  CREATININE  --   --  1.22* 1.52* 1.05* 1.27*  CALCIUM  --   < > 8.7* 8.9 8.5* 8.5*  PHOS 1.9*  --  2.2* 3.2  --   --   < > = values in this interval not displayed.  Liver Function Tests:  Recent Labs Lab 10/17/15 1545  ALBUMIN 2.2*   No results for input(s): LIPASE, AMYLASE in the last 168 hours. No results for input(s): AMMONIA in the last 168 hours.  CBC:  Recent Labs Lab 10/16/15 0625 10/17/15 1545 10/18/15 0640 10/19/15 0145 10/19/15 0515  WBC  11.6* 13.5* 11.3*  --  12.0*  NEUTROABS 9.9*  --   --   --   --   HGB 7.4* 7.3* 6.6* 7.0* 7.4*  HCT 24.6* 24.6* 21.8* 22.5* 24.0*  MCV 101.1* 101.5* 100.4*  --  96.6  PLT 169 166 151  --  145*    Cardiac Enzymes: No results for input(s): CKTOTAL, CKMB, CKMBINDEX, TROPONINI in the last 168 hours.  BNP: Invalid input(s): POCBNP  CBG:  Recent Labs Lab 10/20/15 1611 10/20/15 1945 10/20/15 2352 10/21/15 0422 10/21/15 0728  GLUCAP 130* 156* 144* 156* 135*    Microbiology: Results for orders placed or performed during the hospital encounter of 08-17-15  Blood Culture (routine x 2)     Status: None   Collection Time: 08-17-15  8:51 AM  Result Value Ref Range Status   Specimen Description BLOOD [name]  Final   Special Requests BOTTLES DRAWN AEROBIC AND ANAEROBIC  3CC  Final   Culture NO GROWTH 5 DAYS  Final   Report Status 08/17/2015 FINAL  Final  Blood Culture (routine x 2)     Status: None   Collection Time: 08-17-15  9:20 AM  Result Value Ref Range  Status   Specimen Description BLOOD LEFT ARM  Final   Special Requests BOTTLES DRAWN AEROBIC AND ANAEROBIC  1CC  Final   Culture NO GROWTH 5 DAYS  Final   Report Status 08/17/2015 FINAL  Final  Wound culture     Status: None   Collection Time: 2015-09-10  9:20 AM  Result Value Ref Range Status   Specimen Description DECUBITIS  Final   Special Requests Normal  Final   Gram Stain   Final    FEW WBC SEEN MANY GRAM NEGATIVE RODS RARE GRAM POSITIVE COCCI    Culture   Final    HEAVY GROWTH ESCHERICHIA COLI MODERATE GROWTH ENTEROBACTER AEROGENES PROTEUS MIRABILIS HEAVY GROWTH ENTEROCOCCUS SPECIES VRE HAVE INTRINSIC RESISTANCE TO MOST COMMONLY USED ANTIBIOTICS AND THE ABILITY TO ACQUIRE RESISTANCE TO MOST AVAILABLE ANTIBIOTICS.    Report Status 08/16/2015 FINAL  Final   Organism ID, Bacteria ESCHERICHIA COLI  Final   Organism ID, Bacteria ENTEROBACTER AEROGENES  Final   Organism ID, Bacteria PROTEUS MIRABILIS  Final   Organism  ID, Bacteria ENTEROCOCCUS SPECIES  Final      Susceptibility   Enterobacter aerogenes - MIC*    CEFTAZIDIME <=1 SENSITIVE Sensitive     CEFAZOLIN >=64 RESISTANT Resistant     CEFTRIAXONE <=1 SENSITIVE Sensitive     CIPROFLOXACIN <=0.25 SENSITIVE Sensitive     GENTAMICIN <=1 SENSITIVE Sensitive     IMIPENEM 1 SENSITIVE Sensitive     TRIMETH/SULFA <=20 SENSITIVE Sensitive     * MODERATE GROWTH ENTEROBACTER AEROGENES   Escherichia coli - MIC*    AMPICILLIN <=2 SENSITIVE Sensitive     CEFTAZIDIME <=1 SENSITIVE Sensitive     CEFAZOLIN <=4 SENSITIVE Sensitive     CEFTRIAXONE <=1 SENSITIVE Sensitive     CIPROFLOXACIN <=0.25 SENSITIVE Sensitive     GENTAMICIN <=1 SENSITIVE Sensitive     IMIPENEM <=0.25 SENSITIVE Sensitive     TRIMETH/SULFA <=20 SENSITIVE Sensitive     * HEAVY GROWTH ESCHERICHIA COLI   Proteus mirabilis - MIC*    AMPICILLIN >=32 RESISTANT Resistant     CEFTAZIDIME <=1 SENSITIVE Sensitive     CEFAZOLIN 8 SENSITIVE Sensitive     CEFTRIAXONE <=1 SENSITIVE Sensitive     CIPROFLOXACIN <=0.25 SENSITIVE Sensitive     GENTAMICIN <=1 SENSITIVE Sensitive     IMIPENEM 1 SENSITIVE Sensitive     TRIMETH/SULFA <=20 SENSITIVE Sensitive     * PROTEUS MIRABILIS   Enterococcus species - MIC*    AMPICILLIN >=32 RESISTANT Resistant     VANCOMYCIN >=32 RESISTANT Resistant     GENTAMICIN SYNERGY SENSITIVE Sensitive     TETRACYCLINE Value in next row Resistant      RESISTANT>=16    * HEAVY GROWTH ENTEROCOCCUS SPECIES  MRSA PCR Screening     Status: None   Collection Time: 09/10/2015  2:38 PM  Result Value Ref Range Status   MRSA by PCR NEGATIVE NEGATIVE Final    Comment:        The GeneXpert MRSA Assay (FDA approved for NASAL specimens only), is one component of a comprehensive MRSA colonization surveillance program. It is not intended to diagnose MRSA infection nor to guide or monitor treatment for MRSA infections.   Blood culture (single)     Status: None   Collection Time:  09/10/15  3:36 PM  Result Value Ref Range Status   Specimen Description BLOOD RIGHT ASSIST CONTROL  Final   Special Requests BOTTLES DRAWN AEROBIC AND ANAEROBIC  Final  Culture NO GROWTH 5 DAYS  Final   Report Status 08/17/2015 FINAL  Final  Wound culture     Status: None   Collection Time: 08/13/15 12:37 PM  Result Value Ref Range Status   Specimen Description WOUND  Final   Special Requests Normal  Final   Gram Stain   Final    FEW WBC SEEN TOO NUMEROUS TO COUNT GRAM NEGATIVE RODS FEW GRAM POSITIVE COCCI    Culture   Final    HEAVY GROWTH ESCHERICHIA COLI MODERATE GROWTH PROTEUS MIRABILIS LIGHT GROWTH KLEBSIELLA PNEUMONIAE MODERATE GROWTH ENTEROCOCCUS GALLINARUM CRITICAL RESULT CALLED TO, READ BACK BY AND VERIFIED WITH: [facility] LtdANDRA BORBA AT 1042 08/16/15 DV    Report Status 08/17/2015 FINAL  Final   Organism ID, Bacteria ESCHERICHIA COLI  Final   Organism ID, Bacteria PROTEUS MIRABILIS  Final   Organism ID, Bacteria KLEBSIELLA PNEUMONIAE  Final   Organism ID, Bacteria ENTEROCOCCUS GALLINARUM  Final      Susceptibility   Escherichia coli - MIC*    AMPICILLIN >=32 RESISTANT Resistant     CEFTAZIDIME 4 RESISTANT Resistant     CEFAZOLIN >=64 RESISTANT Resistant     CEFTRIAXONE 16 RESISTANT Resistant     GENTAMICIN 2 SENSITIVE Sensitive     IMIPENEM >=16 RESISTANT Resistant     TRIMETH/SULFA <=20 SENSITIVE Sensitive     Extended ESBL POSITIVE Resistant     PIP/TAZO Value in next row Resistant      RESISTANT>=128    CIPROFLOXACIN Value in next row Sensitive      SENSITIVE<=0.25    * HEAVY GROWTH ESCHERICHIA COLI   Klebsiella pneumoniae - MIC*    AMPICILLIN Value in next row Resistant      SENSITIVE<=0.25    CEFTAZIDIME Value in next row Resistant      SENSITIVE<=0.25    CEFAZOLIN Value in next row Resistant      SENSITIVE<=0.25    CEFTRIAXONE Value in next row Resistant      SENSITIVE<=0.25    CIPROFLOXACIN Value in next row Resistant      SENSITIVE<=0.25     GENTAMICIN Value in next row Sensitive      SENSITIVE<=0.25    IMIPENEM Value in next row Resistant      SENSITIVE<=0.25    TRIMETH/SULFA Value in next row Resistant      SENSITIVE<=0.25    PIP/TAZO Value in next row Resistant      RESISTANT>=128    * LIGHT GROWTH KLEBSIELLA PNEUMONIAE   Proteus mirabilis - MIC*    AMPICILLIN Value in next row Resistant      RESISTANT>=128    CEFTAZIDIME Value in next row Sensitive      RESISTANT>=128    CEFAZOLIN Value in next row Sensitive      RESISTANT>=128    CEFTRIAXONE Value in next row Sensitive      RESISTANT>=128    CIPROFLOXACIN Value in next row Sensitive      RESISTANT>=128    GENTAMICIN Value in next row Sensitive      RESISTANT>=128    IMIPENEM Value in next row Sensitive      RESISTANT>=128    TRIMETH/SULFA Value in next row Sensitive      RESISTANT>=128    PIP/TAZO Value in next row Sensitive      SENSITIVE<=4    * MODERATE GROWTH PROTEUS MIRABILIS   Enterococcus gallinarum - MIC*    AMPICILLIN Value in next row Resistant      SENSITIVE<=4    GENTAMICIN  SYNERGY Value in next row Sensitive      SENSITIVE<=4    CIPROFLOXACIN Value in next row Resistant      RESISTANT>=8    TETRACYCLINE Value in next row Resistant      RESISTANT>=16    * MODERATE GROWTH ENTEROCOCCUS GALLINARUM  Wound culture     Status: None   Collection Time: 08/15/15  2:53 PM  Result Value Ref Range Status   Specimen Description WOUND  Final   Special Requests NONE  Final   Gram Stain FEW WBC SEEN NO ORGANISMS SEEN   Final   Culture NO GROWTH 3 DAYS  Final   Report Status 08/18/2015 FINAL  Final  C difficile quick scan w PCR reflex     Status: None   Collection Time: 08/17/15 11:34 AM  Result Value Ref Range Status   C Diff antigen NEGATIVE NEGATIVE Final   C Diff toxin NEGATIVE NEGATIVE Final   C Diff interpretation Negative for C. difficile  Final  Stool culture     Status: None   Collection Time: 09/03/15  3:51 PM  Result Value Ref Range  Status   Specimen Description STOOL  Final   Special Requests Immunocompromised  Final   Culture   Final    NO SALMONELLA OR SHIGELLA ISOLATED No Pathogenic E. coli detected NO CAMPYLOBACTER DETECTED    Report Status 09/07/2015 FINAL  Final  C difficile quick scan w PCR reflex     Status: None   Collection Time: 09/13/15 12:51 AM  Result Value Ref Range Status   C Diff antigen NEGATIVE NEGATIVE Final   C Diff toxin NEGATIVE NEGATIVE Final   C Diff interpretation Negative for C. difficile  Final  Culture, blood (routine x 2)     Status: None   Collection Time: 09/16/15 11:24 AM  Result Value Ref Range Status   Specimen Description BLOOD LEFT HAND  Final   Special Requests BOTTLES DRAWN AEROBIC AND ANAEROBIC  1CC  Final   Culture NO GROWTH 5 DAYS  Final   Report Status 09/21/2015 FINAL  Final  Culture, blood (routine x 2)     Status: None   Collection Time: 09/16/15 12:23 PM  Result Value Ref Range Status   Specimen Description BLOOD RIGHT HAND  Final   Special Requests BOTTLES DRAWN AEROBIC AND ANAEROBIC  1CC  Final   Culture  Setup Time   Final    GRAM POSITIVE COCCI AEROBIC BOTTLE ONLY CRITICAL RESULT CALLED TO, READ BACK BY AND VERIFIED WITH: TESS THOMAS,RN 09/17/2015 0631 BY JRS.    Culture   Final    ENTEROCOCCUS FAECALIS AEROBIC BOTTLE ONLY VRE HAVE INTRINSIC RESISTANCE TO MOST COMMONLY USED ANTIBIOTICS AND THE ABILITY TO ACQUIRE RESISTANCE TO MOST AVAILABLE ANTIBIOTICS. CRITICAL RESULT CALLED TO, READ BACK BY AND VERIFIED WITH: CHERYL SMITH AT 1610 09/19/15 DV    Report Status 09/21/2015 FINAL  Final   Organism ID, Bacteria ENTEROCOCCUS FAECALIS  Final      Susceptibility   Enterococcus faecalis - MIC*    AMPICILLIN <=2 SENSITIVE Sensitive     LINEZOLID 2 SENSITIVE Sensitive     CIPROFLOXACIN Value in next row Resistant      RESISTANT>=8    TETRACYCLINE Value in next row Resistant      RESISTANT>=16    VANCOMYCIN Value in next row Resistant      RESISTANT>=32     GENTAMICIN SYNERGY Value in next row Resistant      RESISTANT>=32    *  ENTEROCOCCUS FAECALIS  Culture, respiratory (NON-Expectorated)     Status: None   Collection Time: 09/16/15  3:50 PM  Result Value Ref Range Status   Specimen Description TRACHEAL ASPIRATE  Final   Special Requests Immunocompromised  Final   Gram Stain   Final    FEW WBC SEEN GOOD SPECIMEN - 80-90% WBCS RARE GRAM NEGATIVE RODS    Culture LIGHT GROWTH PSEUDOMONAS AERUGINOSA  Final   Report Status 09/19/2015 FINAL  Final   Organism ID, Bacteria PSEUDOMONAS AERUGINOSA  Final      Susceptibility   Pseudomonas aeruginosa - MIC*    CEFTAZIDIME 8 SENSITIVE Sensitive     CIPROFLOXACIN 2 INTERMEDIATE Intermediate     GENTAMICIN >=16 RESISTANT Resistant     IMIPENEM >=16 RESISTANT Resistant     PIP/TAZO Value in next row Sensitive      SENSITIVE32    CEFEPIME Value in next row Sensitive      SENSITIVE8    LEVOFLOXACIN Value in next row Resistant      RESISTANT>=8    * LIGHT GROWTH PSEUDOMONAS AERUGINOSA  Culture, expectorated sputum-assessment     Status: None   Collection Time: 09/22/15  2:09 PM  Result Value Ref Range Status   Specimen Description ENDOTRACHEAL  Final   Special Requests Normal  Final   Sputum evaluation THIS SPECIMEN IS ACCEPTABLE FOR SPUTUM CULTURE  Final   Report Status 09/24/2015 FINAL  Final  Culture, respiratory (NON-Expectorated)     Status: None   Collection Time: 09/22/15  2:09 PM  Result Value Ref Range Status   Specimen Description ENDOTRACHEAL  Final   Special Requests Normal Reflexed from [id number]  Final   Gram Stain   Final    FEW WBC SEEN FEW GRAM NEGATIVE RODS POOR SPECIMEN - LESS THAN 70% WBCS    Culture   Final    MODERATE GROWTH PSEUDOMONAS AERUGINOSA LIGHT GROWTH KLEBSIELLA PNEUMONIAE REFER TO SENSITIVITIES FROM PREVIOUS CULTURE FOR ORG 2    Report Status 10/01/2015 FINAL  Final   Organism ID, Bacteria PSEUDOMONAS AERUGINOSA  Final   Organism ID, Bacteria KLEBSIELLA  PNEUMONIAE  Final      Susceptibility   Pseudomonas aeruginosa - MIC*    CEFTAZIDIME 4 SENSITIVE Sensitive     CIPROFLOXACIN 2 INTERMEDIATE Intermediate     GENTAMICIN >=16 RESISTANT Resistant     IMIPENEM >=16 RESISTANT Resistant     PIP/TAZO Value in next row Sensitive      SENSITIVE16    * MODERATE GROWTH PSEUDOMONAS AERUGINOSA  Tissue culture     Status: None   Collection Time: 09/24/15  6:44 AM  Result Value Ref Range Status   Specimen Description BONE  Final   Special Requests Normal  Final   Gram Stain MODERATE WBC SEEN FEW GRAM NEGATIVE RODS   Final   Culture   Final    MODERATE GROWTH ESCHERICHIA COLI MODERATE GROWTH KLEBSIELLA PNEUMONIAE ESBL-EXTENDED SPECTRUM BETA LACTAMASE-THE ORGANISM IS RESISTANT TO PENICILLINS, CEPHALOSPORINS AND AZTREONAM ACCORDING TO CLSI M100-S15 VOL.25 N01 JAN 2005. ORGANISM 1 This organism isolate is resistant to one or more antiotic agents in three or more antimicrobial categories.  Suggest Infectious Disease consult.   ORGANISM 2 CRITICAL RESULT CALLED TO, READ BACK BY AND VERIFIED WITH: RN Mardene Celeste LINDSAY 09/27/15 1005AM    Report Status 09/28/2015 FINAL  Final   Organism ID, Bacteria ESCHERICHIA COLI  Final   Organism ID, Bacteria KLEBSIELLA PNEUMONIAE  Final      Susceptibility   Escherichia  coli - MIC*    AMPICILLIN >=32 RESISTANT Resistant     CEFTAZIDIME 4 RESISTANT Resistant     CEFAZOLIN >=64 RESISTANT Resistant     CEFTRIAXONE 8 RESISTANT Resistant     GENTAMICIN <=1 SENSITIVE Sensitive     IMIPENEM 8 RESISTANT Resistant     TRIMETH/SULFA <=20 SENSITIVE Sensitive     Extended ESBL POSITIVE Resistant     PIP/TAZO Value in next row Resistant      RESISTANT>=128    * MODERATE GROWTH ESCHERICHIA COLI   Klebsiella pneumoniae - MIC*    AMPICILLIN Value in next row Resistant      RESISTANT>=128    CEFTAZIDIME Value in next row Resistant      RESISTANT>=128    CEFAZOLIN Value in next row Resistant      RESISTANT>=128    CEFTRIAXONE  Value in next row Resistant      RESISTANT>=128    CIPROFLOXACIN Value in next row Resistant      RESISTANT>=128    GENTAMICIN Value in next row Resistant      RESISTANT>=128    IMIPENEM Value in next row Resistant      RESISTANT>=128    TRIMETH/SULFA Value in next row Sensitive      RESISTANT>=128    PIP/TAZO Value in next row Resistant      RESISTANT>=128    * MODERATE GROWTH KLEBSIELLA PNEUMONIAE  Anaerobic culture     Status: None   Collection Time: 09/24/15  3:55 PM  Result Value Ref Range Status   Specimen Description BONE  Final   Special Requests Normal  Final   Culture NO ANAEROBES ISOLATED  Final   Report Status 09/29/2015 FINAL  Final  Culture, fungus without smear     Status: None   Collection Time: 09/24/15  3:55 PM  Result Value Ref Range Status   Specimen Description BONE  Final   Special Requests Normal  Final   Culture NO FUNGUS ISOLATED AFTER 24 DAYS  Final   Report Status 10/18/2015 FINAL  Final  C difficile quick scan w PCR reflex     Status: None   Collection Time: 10/07/15  2:02 PM  Result Value Ref Range Status   C Diff antigen NEGATIVE NEGATIVE Final   C Diff toxin NEGATIVE NEGATIVE Final   C Diff interpretation Negative for C. difficile  Final  Culture, blood (routine x 2)     Status: None (Preliminary result)   Collection Time: 10/16/15  9:52 AM  Result Value Ref Range Status   Specimen Description BLOOD LEFT HAND  Final   Special Requests   Final    BOTTLES DRAWN AEROBIC AND ANAEROBIC  AER 6CC ANA 4CC   Culture NO GROWTH 4 DAYS  Final   Report Status PENDING  Incomplete  Culture, blood (routine x 2)     Status: None (Preliminary result)   Collection Time: 10/16/15 12:25 PM  Result Value Ref Range Status   Specimen Description BLOOD LEFT HAND  Final   Special Requests BOTTLES DRAWN AEROBIC AND ANAEROBIC  5CC  Final   Culture NO GROWTH 4 DAYS  Final   Report Status PENDING  Incomplete  Culture, expectorated sputum-assessment     Status: None    Collection Time: 10/18/15  1:15 PM  Result Value Ref Range Status   Specimen Description SPUTUM  Final   Special Requests NONE  Final   Sputum evaluation THIS SPECIMEN IS ACCEPTABLE FOR SPUTUM CULTURE  Final   Report Status  10/18/2015 FINAL  Final  Culture, respiratory (NON-Expectorated)     Status: None (Preliminary result)   Collection Time: 10/18/15  1:15 PM  Result Value Ref Range Status   Specimen Description SPUTUM  Final   Special Requests NONE Reflexed from [id number]  Final   Gram Stain   Final    GOOD SPECIMEN - 80-90% WBCS MODERATE WBC SEEN MANY GRAM NEGATIVE RODS    Culture   Final    HEAVY GROWTH PSEUDOMONAS AERUGINOSA MODERATE GROWTH GRAM NEGATIVE RODS IDENTIFICATION AND SUSCEPTIBILITIES TO FOLLOW    Report Status PENDING  Incomplete  C difficile quick scan w PCR reflex     Status: None   Collection Time: 10/18/15  4:39 PM  Result Value Ref Range Status   C Diff antigen NEGATIVE NEGATIVE Final   C Diff toxin NEGATIVE NEGATIVE Final   C Diff interpretation Negative for C. difficile  Final    Coagulation Studies: No results for input(s): LABPROT, INR in the last 72 hours.  Urinalysis: No results for input(s): COLORURINE, LABSPEC, PHURINE, GLUCOSEU, HGBUR, BILIRUBINUR, KETONESUR, PROTEINUR, UROBILINOGEN, NITRITE, LEUKOCYTESUR in the last 72 hours.  Invalid input(s): APPERANCEUR    Imaging: No results found.   Medications:   . feeding supplement (JEVITY 1.5 CAL/FIBER) 1,000 mL (10/21/15 0604)  . norepinephrine Stopped (10/08/15 0530)   . albumin human  25 g Intravenous Once  . amantadine  200 mg Per Tube Q7 days  . antiseptic oral rinse  7 mL Mouth Rinse QID  . aspirin  81 mg Oral Daily  . cefTAZidime (FORTAZ)  IV  1 g Intravenous Q24H  . chlorhexidine gluconate  15 mL Mouth Rinse BID  . epoetin (EPOGEN/PROCRIT) injection  10,000 Units Intravenous Q M,W,F-HD  . escitalopram  10 mg Oral Daily  . feeding supplement (PRO-STAT SUGAR FREE 64)  30 mL Oral 6 X  Daily  . folic acid  1 mg Oral Daily  . free water  20 mL Per Tube 6 times per day  . heparin subcutaneous  5,000 Units Subcutaneous Q12H  . hydrocortisone  10 mg Per Tube BID  . insulin aspart  0-9 Units Subcutaneous 6 times per day  . ipratropium-albuterol  3 mL Nebulization QID  . lidocaine  1 patch Transdermal Q24H  . midodrine  5 mg Per Tube TID WC  . multivitamin  5 mL Oral Daily  . pantoprazole sodium  40 mg Per Tube Daily  . saccharomyces boulardii  250 mg Oral BID  . sodium chloride  10-40 mL Intracatheter Q12H  . sulfamethoxazole-trimethoprim  2.5 tablet Per Tube q1800  . thiamine  100 mg Per Tube Daily   sodium chloride, sodium chloride, acetaminophen (TYLENOL) oral liquid 160 mg/5 mL, HYDROmorphone (DILAUDID) injection, loperamide, morphine injection, ondansetron (ZOFRAN) IV, oxyCODONE, sodium chloride, zinc oxide  Assessment/ Plan:  49 y.o. 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 Admitted on 08-17-15  1. End-stage renal disease on hemodialysis on HD MWF. The patient has been on dialysis since October of 2014. R IJ permcath. Dialysis treatment with IV  albumin for oncotic support.  -  Patient due for hemodialysis today. Orders have been prepared. Ultrafiltration target is 1 kg. As before we will use albumin for blood pressure support.   2. Anemia of CKD.  Last hemoglobin was on 10/19/2015. Would consider  follow-up CBC. We have maintain the patient on Epogen however this does not appear to be very effective given her underlying severe morbidity. Patient has received transfusion in the past and we recommend blood transfusion for hemoglobin of 7 or less.  3. Secondary hyperparathyroidism: PTH low at 55 (9/25) - Check phosphorus today..    4. Sepsis: VRE in blood. Bone cultures E. Coli and klebsiella - Treatment as per ID and ICU team: daptomycin completed on 11/10 and now on trimethoprim/sulfamethoxazole and levaquin. -It appears that Pseudomonas is growing in the sputum. Patient currently on Ceptaz ceftazidime.  5. Generalized Dependent edema/Anasarca due to 3rd spacing -  UF target 1kg using albumin for BP support, will also use low BFRs.  6. Acute resp failure - New infiltrate was noted in the left lower lobe on Xray. -Pseudomonas noted in the sputum.  -Continue ventilator support, ceftazidime added to medication regimen.   LOS: 70 Tucker Steedley 12/2/20168:36 AM

## 2015-10-21 NOTE — Progress Notes (Signed)
[facility] Critical Care Medicine Progess Note  Name: Courtney [name] MRN: [id number] DOB: 01-18-1966    ADMISSION DATE:  08/04/2015   INITIAL PRESENTATION:  49 AAF who has been in medical facilities (hosp, LTAC, rehab) for 2 yrs following gastric bypass surgery with multiple complications. Now with chronic trach, ESRD, profound debilitation, severe sacral pressure ulcer. Was seemingly making progress and transferred to rehab facility approx one week prior to this admission. She was sent to [facility] ED with AMS and hypotension. Working dx of severe sepsis/septic shock due to infected sacral pressure ulcer. Since admission. Her course is been very complicated with numerous complications including septic shock and GI bleeding.    INTERVAL HISTORY: The patient is arousable, but does not respond to my questions. She appears to be breathing comfortably on the ventilator. She is awake.    Summary of MAJOR EVENTS/TEST RESULTS: Admission 02/07/14-05/07/14 Admission 07/21/14-09/06/14 Discharged to [facility]. Pt had palliative consult at that time, were asked to sign off by husband.  09/23 CT head: NAD 09/23 EEG: no epileptiform activity 09/23 PRBCs for Hgb 6.4 09/24 bedside debridement of sacral wound. Abscess drained 09/25 Off vasopressors. More alert. No distress. Worsening thrombocytopenia. Vanc DC'd 09/29  Dr. Sampson Goon (I.D) excused from the case by patient's husband. 10/02 MRI -multiple infarcts 10/03 tracheal bleeding- transfused platelets 10/03 hospitalist service excused from the case by patient's husband 10/03 Echocardiogram ejection fraction was 55-60%, pulmonary systolic pressure was 39 mmHg 41/32 restart TF's at lower rate, attempt reg HD  10/12 Transferred to med-surg floor. Remains on PCCM service 10/14 SLP eval: pt unable to tolerate PMV adequately 10/17-will re-attempt PM valve-discussed with Speech therapist 10/18 passed swallow eval-start pureed thick foods no thin  liquids-continue NG feeds 10/19 transferred to step down for sepsis/aspiration pneumonia 10/19 cxr shows RLL opacity 10/21 sacral decub debride at bedside by surgery 10/22 started back on vasopressors while on HD 10/27 CT with osteo, R hip fx, unable to identify tip of dubhoff tube - sent for fluoro study 10/28 Ortho consultation: I do not feel that she is a surgical candidate. Therefore, I feel that it would best to manage this fracture nonsurgically and allow it to heal by itself over time, which it should.  10/28 Gen Surg consultation: Due to the lack of free air or free flow of contrast and the peritoneum there is no indication for any surgical intervention on this. Would recommend pulling back the feeding tube 1-2 cm. Would recheck an abdominal film to confirm no pneumoperitoneum in the morning. Absence of any changes okay to continue using Dobbhoff for feeding and medications. 10/28 gastrograffin study: The study confirms that the feeding tube pes perforated through the duodenum and the tip is within a cavity that fills with injected contrast. The cavity does appear walled off 11/4 refuses oral feeds, continue TF's CT reviewed: T8-T9 discitis/osteomyelitis, RT HIP FRACTURE 11/5 placed back on vasopressors, placed back on Vent due to resp acidosis, levophed turned off 11/6 afternoon 11/5 PM Trach changed out due to cuff leak, #6 Shiley cuffed, 11/6 dubhoff occluded, removed and replaced, new dubhoff shows tube in the antral stomach 11/15 refusing to take oral feeds 11/18 placed back on Vent for increased WOB,SOB. 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 10/18/2015; the patient developed a new left basal pneumonia; with recurrent sepsis.  10/19/2015; fevers resolved with IV acetaminophen. Tube feeds changed from vital to Jevity.  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 Pneumoniae, P. Mirabilis, VRE Wound 9/26 >> No growth Blood 9/23 >> NEG CDiff 9/27>>neg Stool Cx 10/15>> negative Cdiff 10/25>>neg Trach Aspirate 10/28>> light growth pseudomonas Blood 10/28 >> 1/2 GPC >>  Sputum cultures obtained 09/22/15 due to mucus plugging>>Pseudomonas BONE TISSUE Cx 11/3>>E.coli (ESBL), k. Pneumonia (daptomycin and septra) Bld Cx 11/27>>negative thus far.  Trach Asp 11/27>> grew out Pseudomonas, and Klebsiella pneumonia.  ANTIMICROBIALS:  Aztreonam 9/23 >> 9/24 Vanc 9/23 >> 9/25 Vanc 9/26>>9/27 Daptomycin 9/27>> 10/12, 10/28>> off Meropenem 9/23 >> 10/14, 10/28 >> 10/31 Septra 11/8>> Zosyn 11/27,>> 11/30. Levaquin 11/29>> 11/29. Ceftaz 12/1>>  VITAL SIGNS: Temp:  [97.7 F (36.5 C)-98.8 F (37.1 C)] 97.7 F (36.5 C) (12/02 0745) Pulse Rate:  [87-98] 95 (12/02 0700) Resp:  [19-31] 31 (12/02 0700) BP: (63-107)/(36-65) 106/50 mmHg (12/02 0700) SpO2:  [92 %-100 %] 100 % (12/02 0857) FiO2 (%):  [35 %] 35 % (12/02 1157) HEMODYNAMICS:   VENTILATOR SETTINGS: Vent Mode:  [-] PRVC FiO2 (%):  [35 %] 35 % Set Rate:  [16 bmp] 16 bmp Vt Set:  [500 mL] 500 mL PEEP:  [5 cmH20] 5 cmH20 Plateau Pressure:  [20 cmH20-23 cmH20] 23 cmH20 INTAKE / OUTPUT:  Intake/Output Summary (Last 24 hours) at 10/21/15 1304 Last data filed at 10/21/15 0700  Gross per 24 hour  Intake   1010 ml  Output      0 ml  Net   1010 ml   PHYSICAL EXAMINATION: Physical Examination:   VS: BP 106/50 mmHg  Pulse 95  Temp(Src) 97.7 F (36.5 C) (Axillary)  Resp 31  Ht  (1.753 m)  Wt 97 kg (213 lb 13.5 oz)  BMI 31.57 kg/m2  SpO2 100%  LMP  (LMP Unknown)    General Appearance: No resp distress, coarse upper airway sounds, on vent Neuro: profoundly diffusely weak, alert today, nodding head HEENT: cushingoid facies, PERRLA, EOM intact Neck: trach site clean Pulmonary: clear anteriorly Cardiovascular: reg, + syst murmur Abdomen: soft, NT, +BS Extremities: warm, R>L UE edema    LABORATORY PANEL:   CBC  Recent Labs Lab 10/19/15 0515  WBC 12.0*  HGB 7.4*  HCT 24.0*  PLT 145*    Chemistries   Recent Labs Lab 10/17/15 1545  10/19/15 0515  NA 144  < > 142  K 4.7  < > 4.6  CL 101  < > 100*  CO2 33*  < > 33*  GLUCOSE 153*  < > 183*  BUN 93*  < > 76*  CREATININE 1.52*  < > 1.27*  CALCIUM 8.9  < > 8.5*  PHOS 3.2  --   --   < > = values in this interval not displayed.     IMPRESSION/PLAN (current major issues):  Recurrent sepsis, due to pneumonia.   Anemia with macrocytosis. -Uncertain if this is due to chronic slow GI bleed or simple critical care associated anemia. She has already receiving folate, thiamine, and multivitamin. Her anemia has not responded to oral medications, this may be due to her GI malabsorption.  Pneumonia with sepsis-better. -Chest x-ray shows new left lower lobe infiltrate. Sputum cultures show pseudomonas. The  patient is once again, vent dependent. She has developed again recurrent sepsis.Marland Kitchen. Sputum cultures showing Pseudomonas, and Klebsiella, currently covered with this. Ceftaz.  Worsening sacral decubitus ulcer, stage IV. -The patient is already had a sacral bone scraping, and has been placed on Septra for antibiotic, per ID recommendations. Her sacral decubitus does not appear to be improving and in fact appears to be worsening. In addition, she has several new smaller areas of tissue breakdown.  End-stage renal disease. -Continue on hemodialysis per nephrology.  Diarrhea. -This has improved since changing her loperamide 2 twice daily scheduled, and changing her tube feeds back to Jevity.  Continue loperamide to when necessary.  Patient appears to be stable for transfer to an LTAC facility at this point in time.   Additional Chronic issues and complicating factors during this admission.  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  Thrombocytopenia, resolved - HIT panel negative 9/25 Recurrent Severe sepsis, due to sacral pressure ulcer DM 2, controlled.  Chronic steroid therapy, for a history of of adrenal insufficiency Incidental finding of R hip fx - conservative mgmt. Waxing and waning encephalopathy - she was evaluated by both the psychiatry and neurology services, and it was deemed that the patient lacks decisional capacity at this time. 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 Husband declined TEE Profound deconditioning-criticall illness neuropathy Chronic pain   Husband updated at phone number [phone]. I informed him of the culture results, which was most likely the cause of her sepsis, which is now better after covering her with additional antibiotics. He had no additional questions. Wells Guileseep Ramere Downs M.D.   Critical Care Attestation.  I have personally obtained a history, examined the patient, evaluated laboratory and imaging results, formulated the assessment and plan and placed orders. The case was discussed with the critical care RN, nutrition, respiratory therapist, Associate Professorunit manager. The Patient requires high complexity decision making for assessment and support, frequent evaluation and titration of therapies, application of advanced monitoring technologies and extensive interpretation of multiple databases. The patient has critical illness that could lead imminently to failure of 1 or more organ systems and requires the highest level of physician preparedness to intervene.  Critical Care Time devoted to patient care services described in this note is 35 minutes and is exclusive of time spent in  procedures.

## 2015-10-21 NOTE — Progress Notes (Signed)
Occupational Therapy Treatment Patient Details Name: Courtney [name] MRN: [id number] DOB: 1966/04/18 Today's Date: 10/21/2015    History of present illness Pt is a 67 F who has been in medical facilities (hosp, LTAC, rehab) for 2 yrs following gastric bypass surgery with multiple complications. Now with chronic trach, ESRD, profound debilitation, severe sacral pressure ulcer (stage IV). Was seemingly making progress and transferred to rehab facility approx one week prior to this admission. She was sent to [facility] ED 9/23 with AMS and hypotension. Admitted with sepsis related to infected sacral ulcer.  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  Patient transferred to CCU to med-surg floor during hospitalization, but experienced profound interdialytic hypotension requiring return transfer to CCU for use of pressors (now off); remains in CCU.  Noted with R hip intertrochanteric fracture, recommended for conservative management per orthopedics.  Hospital course continues to be complicated with recurrent sepsis requiring patient on/off vent multiple times; currently on vent support with PEEP 5.  Limited ability/willingness to follow commands or actively participate in therapeutic interventions.  Per care team, physicians recommending hospice care/comfort measures at this time; husband considering goals of care/POC pending discussion with patient and other family members, but wishes continued medical care at this time. Will carefully monitor medical plan and POC to evaluate appropriateness of continued PT throughout remaining hospitalization.   OT comments  Patient agreed (shook head yes) to this Thereasa Parkin working with hands. Found hands to be positioned properly for edema control. Left hand does not show edema and right minimal edema compared to before.  Anti-edema massage given to left, stretching to both sets of fingers and elbow.    Follow Up Recommendations       Equipment Recommendations       Recommendations for Other Services      Precautions / Restrictions Precautions Precautions: Fall Precaution Comments: stage 4 sacral ulcer, NG tube/NPO, trach, contact isolation, R IJ permcath, L IJ central line, log roll only (as needed for dressing changes/hygiene) and no ROM to R LE/no attempts at sitting       Mobility Bed Mobility                  Transfers                      Balance                                   ADL                                                Vision                     Perception     Praxis      Cognition                             Extremity/Trunk Assessment               Exercises     Shoulder Instructions       General Comments      Pertinent Vitals/  Pain          Home Living                                          Prior Functioning/Environment              Frequency       Progress Toward Goals  OT Goals(current goals can now be found in the care plan section)        Plan      Co-evaluation                 End of Session     Activity Tolerance     Patient Left     Nurse Communication          Time: 1450-1500 OT Time Calculation (min): 10 min  Charges: OT General Charges $OT Visit: 1 Procedure OT Treatments $Massage: 8-22 mins  Gwyndolyn KaufmanHuff, Allis Quirarte M Pasco Marchitto M Ekam Besson, MS/OTR/L  10/21/2015, 3:10 PM

## 2015-10-21 NOTE — Progress Notes (Addendum)
Pt has blood consent on file, type and screen good until midnight tonight 10/21/15.  One unit of PRBC ordered for hemoglobin level of 6.7.  Hemodialysis nurse now in process of giving 1unit PRBC.  Pt is alert with no complaints of pain.

## 2015-10-21 NOTE — Progress Notes (Signed)
eLink Physician-Brief Progress Note Patient Name: Courtney GrinderHedda McMillian [name] DOB: [birth year] MRN: [id number]   Date of Service  10/21/2015  HPI/Events of Note  CBC Latest Ref Rng 10/21/2015 10/19/2015 10/19/2015  WBC 3.6 - 11.0 K/uL 11.4(H) 12.0(H) -  Hemoglobin 12.0 - 16.0 g/dL 6.7(L) 7.4(L) 7.0(L)  Hematocrit 35.0 - 47.0 % 22.2(L) 24.0(L) 22.5(L)  Platelets 150 - 440 K/uL 172 145(L) -      eICU Interventions  Will transfuse 1 unit PRBC.      Intervention Category Major Interventions: Other:  Jaylyne Breese 10/21/2015, 7:26 PM

## 2015-10-21 NOTE — Progress Notes (Signed)
Nutrition Follow-up  DOCUMENTATION CODES:   Severe malnutrition in context of acute illness/injury  INTERVENTION:   EN: continue TF as per MD orders; no further recommendations at this time. Will continue to follow along and participate in ICU rounds but will not document daily as no further recommendations at this time   NUTRITION DIAGNOSIS:   Inadequate oral intake related to wound healing, acute illness, chronic illness as evidenced by NPO status, estimated needs.  GOAL:   Patient will meet greater than or equal to 90% of their needs  MONITOR:    (Energy Intake, Anthropometrics, Digestive System, Electrolyte/Renal Profile, Glucose Profile)  REASON FOR ASSESSMENT:   Consult Enteral/tube feeding initiation and management  ASSESSMENT:   Pt remains on vent via trach, HD as scheduled, receiving frequent pain medication, no new updates   EN:   Jevity 1.5 at rate of 50 ml/hr, Prostat 6 times daily  Digestive System: noted documentation of at least 3-4 loose stools in past 24 hours, per documentation, no loose stool documented after receiving imodium at 2200 last night; 2 loose stools on 12/30, appears that stool improved temporarily after while on scheduled doses of imodium (2 mg BID) on 10/20, 10/30  Skin:   (stage IV sacrum, stage III hip, unstageable foot; rectal ulcer from fleixseal, ulcer in nose from dobhoff)  Meds: imodium prn, received one dose last night at 2200  Height:   Ht Readings from Last 1 Encounters:  07/24/2015 5\' 9"  (1.753 m)    Weight:   Wt Readings from Last 1 Encounters:  01/31/12 319 lb 8 oz (144.924 kg)    BMI:  Body mass index is 31.57 kg/(m^2).  Estimated Nutritional Needs:   Kcal:  TEE 1851 kcals/d (Ve 7.4, Tmax 37.4) Using current wt (97kg) edema noted (re-intubated)  Protein:  (1.5-2.0 g/kg)145-194 g/d but likely closer to 2.0-2.5 g/kg Using current wt of 97kg  Fluid:  1000ml + UOP  EDUCATION NEEDS:   No education needs identified  at this time  LOW Care Level  Romelle Starcherate Zamya Culhane MS, RD, LDN [phone] Pager

## 2015-10-21 NOTE — Progress Notes (Signed)
Notified eLink of hypotension.MS aware and will continue to monitor.

## 2015-10-21 NOTE — Progress Notes (Signed)
ANTIBIOTIC CONSULT NOTE - Follow up  Pharmacy Consult for Ceftazidime  Indication: pneumonia  Allergies  Allergen Reactions  . Contrast Media [Iodinated Diagnostic Agents] Anaphylaxis  . Ampicillin Rash   Patient Measurements: Height:  (175.3 cm) Weight:  (bed scale broken ) IBW/kg (Calculated) : 66.2  Vital Signs: Temp: 97.7 F (36.5 C) (12/02 0745) Temp Source: Axillary (12/02 0745) BP: 106/50 mmHg (12/02 0700) Pulse Rate: 95 (12/02 0700) Intake/Output from previous day: 12/01 0701 - 12/02 0700 In: 1310 [I.V.:20; NG/GT:1240; IV Piggyback:50] Out: -    Recent Labs  10/19/15 0145 10/19/15 0515  WBC  --  12.0*  HGB 7.0* 7.4*  PLT  --  145*  CREATININE  --  1.27*   Estimated Creatinine Clearance: 65.7 mL/min (by C-G formula based on Cr of 1.27).   Microbiology: Recent Results (from the past 720 hour(s))  Culture, expectorated sputum-assessment     Status: None   Collection Time: 09/22/15  2:09 PM  Result Value Ref Range Status   Specimen Description ENDOTRACHEAL  Final   Special Requests Normal  Final   Sputum evaluation THIS SPECIMEN IS ACCEPTABLE FOR SPUTUM CULTURE  Final   Report Status 09/24/2015 FINAL  Final  Culture, respiratory (NON-Expectorated)     Status: None   Collection Time: 09/22/15  2:09 PM  Result Value Ref Range Status   Specimen Description ENDOTRACHEAL  Final   Special Requests Normal Reflexed from [id number]  Final   Gram Stain   Final    FEW WBC SEEN FEW GRAM NEGATIVE RODS POOR SPECIMEN - LESS THAN 70% WBCS    Culture   Final    MODERATE GROWTH PSEUDOMONAS AERUGINOSA LIGHT GROWTH KLEBSIELLA PNEUMONIAE REFER TO SENSITIVITIES FROM PREVIOUS CULTURE FOR ORG 2    Report Status 10/01/2015 FINAL  Final   Organism ID, Bacteria PSEUDOMONAS AERUGINOSA  Final   Organism ID, Bacteria KLEBSIELLA PNEUMONIAE  Final      Susceptibility   Pseudomonas aeruginosa - MIC*    CEFTAZIDIME 4 SENSITIVE Sensitive     CIPROFLOXACIN 2 INTERMEDIATE  Intermediate     GENTAMICIN >=16 RESISTANT Resistant     IMIPENEM >=16 RESISTANT Resistant     PIP/TAZO Value in next row Sensitive      SENSITIVE16    * MODERATE GROWTH PSEUDOMONAS AERUGINOSA  Tissue culture     Status: None   Collection Time: 09/24/15  6:44 AM  Result Value Ref Range Status   Specimen Description BONE  Final   Special Requests Normal  Final   Gram Stain MODERATE WBC SEEN FEW GRAM NEGATIVE RODS   Final   Culture   Final    MODERATE GROWTH ESCHERICHIA COLI MODERATE GROWTH KLEBSIELLA PNEUMONIAE ESBL-EXTENDED SPECTRUM BETA LACTAMASE-THE ORGANISM IS RESISTANT TO PENICILLINS, CEPHALOSPORINS AND AZTREONAM ACCORDING TO CLSI M100-S15 VOL.25 N01 JAN 2005. ORGANISM 1 This organism isolate is resistant to one or more antiotic agents in three or more antimicrobial categories.  Suggest Infectious Disease consult.   ORGANISM 2 CRITICAL RESULT CALLED TO, READ BACK BY AND VERIFIED WITH: RN Mardene Celeste LINDSAY 09/27/15 1005AM    Report Status 09/28/2015 FINAL  Final   Organism ID, Bacteria ESCHERICHIA COLI  Final   Organism ID, Bacteria KLEBSIELLA PNEUMONIAE  Final      Susceptibility   Escherichia coli - MIC*    AMPICILLIN >=32 RESISTANT Resistant     CEFTAZIDIME 4 RESISTANT Resistant     CEFAZOLIN >=64 RESISTANT Resistant     CEFTRIAXONE 8 RESISTANT Resistant  GENTAMICIN <=1 SENSITIVE Sensitive     IMIPENEM 8 RESISTANT Resistant     TRIMETH/SULFA <=20 SENSITIVE Sensitive     Extended ESBL POSITIVE Resistant     PIP/TAZO Value in next row Resistant      RESISTANT>=128    * MODERATE GROWTH ESCHERICHIA COLI   Klebsiella pneumoniae - MIC*    AMPICILLIN Value in next row Resistant      RESISTANT>=128    CEFTAZIDIME Value in next row Resistant      RESISTANT>=128    CEFAZOLIN Value in next row Resistant      RESISTANT>=128    CEFTRIAXONE Value in next row Resistant      RESISTANT>=128    CIPROFLOXACIN Value in next row Resistant      RESISTANT>=128    GENTAMICIN Value in  next row Resistant      RESISTANT>=128    IMIPENEM Value in next row Resistant      RESISTANT>=128    TRIMETH/SULFA Value in next row Sensitive      RESISTANT>=128    PIP/TAZO Value in next row Resistant      RESISTANT>=128    * MODERATE GROWTH KLEBSIELLA PNEUMONIAE  Anaerobic culture     Status: None   Collection Time: 09/24/15  3:55 PM  Result Value Ref Range Status   Specimen Description BONE  Final   Special Requests Normal  Final   Culture NO ANAEROBES ISOLATED  Final   Report Status 09/29/2015 FINAL  Final  Culture, fungus without smear     Status: None   Collection Time: 09/24/15  3:55 PM  Result Value Ref Range Status   Specimen Description BONE  Final   Special Requests Normal  Final   Culture NO FUNGUS ISOLATED AFTER 24 DAYS  Final   Report Status 10/18/2015 FINAL  Final  C difficile quick scan w PCR reflex     Status: None   Collection Time: 10/07/15  2:02 PM  Result Value Ref Range Status   C Diff antigen NEGATIVE NEGATIVE Final   C Diff toxin NEGATIVE NEGATIVE Final   C Diff interpretation Negative for C. difficile  Final  Culture, blood (routine x 2)     Status: None (Preliminary result)   Collection Time: 10/16/15  9:52 AM  Result Value Ref Range Status   Specimen Description BLOOD LEFT HAND  Final   Special Requests   Final    BOTTLES DRAWN AEROBIC AND ANAEROBIC  AER 6CC ANA 4CC   Culture NO GROWTH 4 DAYS  Final   Report Status PENDING  Incomplete  Culture, blood (routine x 2)     Status: None (Preliminary result)   Collection Time: 10/16/15 12:25 PM  Result Value Ref Range Status   Specimen Description BLOOD LEFT HAND  Final   Special Requests BOTTLES DRAWN AEROBIC AND ANAEROBIC  5CC  Final   Culture NO GROWTH 4 DAYS  Final   Report Status PENDING  Incomplete  Culture, expectorated sputum-assessment     Status: None   Collection Time: 10/18/15  1:15 PM  Result Value Ref Range Status   Specimen Description SPUTUM  Final   Special Requests NONE  Final    Sputum evaluation THIS SPECIMEN IS ACCEPTABLE FOR SPUTUM CULTURE  Final   Report Status 10/18/2015 FINAL  Final  Culture, respiratory (NON-Expectorated)     Status: None (Preliminary result)   Collection Time: 10/18/15  1:15 PM  Result Value Ref Range Status   Specimen Description SPUTUM  Final  Special Requests NONE Reflexed from [id number]  Final   Gram Stain   Final    GOOD SPECIMEN - 80-90% WBCS MODERATE WBC SEEN MANY GRAM NEGATIVE RODS    Culture   Final    HEAVY GROWTH PSEUDOMONAS AERUGINOSA MODERATE GROWTH KLEBSIELLA PNEUMONIAE REPEATING SUSCEPTIBILITES FOR PSEUDOMONAS CRITICAL RESULT CALLED TO, READ BACK BY AND VERIFIED WITHRod Mae AT 1610 10/21/15 DV KLEBSIELLA PNEUMONIAE This organism isolate is resistant to one or more antiotic agents in three or more antimicrobial categories.  Suggest Infectious Disease  consult.      Report Status PENDING  Incomplete   Organism ID, Bacteria PSEUDOMONAS AERUGINOSA  Final   Organism ID, Bacteria KLEBSIELLA PNEUMONIAE  Final      Susceptibility   Klebsiella pneumoniae - MIC*    AMPICILLIN >=32 RESISTANT Resistant     CEFAZOLIN >=64 RESISTANT Resistant     CEFTRIAXONE >=64 RESISTANT Resistant     CIPROFLOXACIN >=4 RESISTANT Resistant     GENTAMICIN >=16 RESISTANT Resistant     IMIPENEM >=16 RESISTANT Resistant     NITROFURANTOIN >=512 RESISTANT Resistant     TRIMETH/SULFA <=20 SENSITIVE Sensitive     * MODERATE GROWTH KLEBSIELLA PNEUMONIAE  C difficile quick scan w PCR reflex     Status: None   Collection Time: 10/18/15  4:39 PM  Result Value Ref Range Status   C Diff antigen NEGATIVE NEGATIVE Final   C Diff toxin NEGATIVE NEGATIVE Final   C Diff interpretation Negative for C. difficile  Final   Assessment: 49 yo female with ESRD receiving HD every MWF. Pharmacy consulted for dosing and monitoring of ceftazidime.   Patients sputum cultures showing heavy growth of Pseudomonas. Susceptibilities still  pending.    Susceptibilities from 10/01/15 shows sensitivity to ceftazidime and Zosyn. Patient has ampicillin allergy.   Plan:  Will continue patient on Ceftazidime 1g every 24 hours for HD dosing.  Pharmacy will continue to monitor labs and susceptibilities.  Cher Nakai, PharmD Pharmacy Resident 10/21/2015 12:55 PM

## 2015-10-22 LAB — CULTURE, BLOOD (ROUTINE X 2)
CULTURE: NO GROWTH
Culture: NO GROWTH

## 2015-10-22 LAB — CBC
HEMATOCRIT: 25.2 % — AB (ref 35.0–47.0)
Hemoglobin: 7.8 g/dL — ABNORMAL LOW (ref 12.0–16.0)
MCH: 29.7 pg (ref 26.0–34.0)
MCHC: 31.2 g/dL — AB (ref 32.0–36.0)
MCV: 95.3 fL (ref 80.0–100.0)
PLATELETS: 170 10*3/uL (ref 150–440)
RBC: 2.64 MIL/uL — ABNORMAL LOW (ref 3.80–5.20)
RDW: 18.1 % — AB (ref 11.5–14.5)
WBC: 11.9 10*3/uL — ABNORMAL HIGH (ref 3.6–11.0)

## 2015-10-22 LAB — TYPE AND SCREEN
ABO/RH(D): A NEG
ABO/RH(D): A NEG
Antibody Screen: NEGATIVE
Antibody Screen: NEGATIVE
UNIT DIVISION: 0
Unit division: 0

## 2015-10-22 LAB — CULTURE, RESPIRATORY W GRAM STAIN

## 2015-10-22 LAB — GLUCOSE, CAPILLARY
GLUCOSE-CAPILLARY: 106 mg/dL — AB (ref 65–99)
GLUCOSE-CAPILLARY: 141 mg/dL — AB (ref 65–99)
Glucose-Capillary: 134 mg/dL — ABNORMAL HIGH (ref 65–99)
Glucose-Capillary: 118 mg/dL — ABNORMAL HIGH (ref 65–99)

## 2015-10-22 LAB — CULTURE, RESPIRATORY

## 2015-10-22 NOTE — Progress Notes (Addendum)
[facility] Critical Care Medicine Progess Note  Name: Courtney GrinderHedda McMillian [place] MRN: [id number] DOB: December 01, 1965    ADMISSION DATE:  July 23, 2015   INITIAL PRESENTATION:  49 AAF who has been in medical facilities (hosp, LTAC, rehab) for 2 yrs following gastric bypass surgery with multiple complications. Now with chronic trach, ESRD, profound debilitation, severe sacral pressure ulcer. Was seemingly making progress and transferred to rehab facility approx one week prior to this admission. She [facility] with AMS and hypotension. Working dx of severe sepsis/septic shock due to infected sacral pressure ulcer. Since admission. Her course is been very complicated with numerous complications including septic shock and GI bleeding.    INTERVAL HISTORY: The patient is arousable, but does not respond to my questions. She appears to be breathing comfortably on the ventilator. She is awake. On 35% FiO2, PRVC, PEEP 5.  Low hemoglobin noted yesterday, transfused 1 unit of packed red blood cells    Summary of MAJOR EVENTS/TEST RESULTS: Admission 02/07/14-05/07/14 Admission 07/21/14-09/06/14 Discharged to [facility]. Pt had palliative consult at that time, were asked to sign off by husband.  09/23 CT head: NAD 09/23 EEG: no epileptiform activity 09/23 PRBCs for Hgb 6.4 09/24 bedside debridement of sacral wound. Abscess drained 09/25 Off vasopressors. More alert. No distress. Worsening thrombocytopenia. Vanc DC'd 09/29  Dr. [name] (I.D) excused from the case by patient's husband. 10/02 MRI -multiple infarcts 10/03 tracheal bleeding- transfused platelets 10/03 hospitalist service excused from the case by patient's husband 10/03 Echocardiogram ejection fraction was 55-60%, pulmonary systolic pressure was 39 mmHg 40/9810/08 restart TF's at lower rate, attempt reg HD  10/12 Transferred to med-surg floor. Remains on PCCM service 10/14 SLP eval: pt unable to tolerate PMV adequately 10/17-will re-attempt PM  valve-discussed with Speech therapist 10/18 passed swallow eval-start pureed thick foods no thin liquids-continue NG feeds 10/19 transferred to step down for sepsis/aspiration pneumonia 10/19 cxr shows RLL opacity 10/21 sacral decub debride at bedside by surgery 10/22 started back on vasopressors while on HD 10/27 CT with osteo, R hip fx, unable to identify tip of dubhoff tube - sent for fluoro study 10/28 Ortho consultation: I do not feel that she is a surgical candidate. Therefore, I feel that it would best to manage this fracture nonsurgically and allow it to heal by itself over time, which it should.  10/28 Gen Surg consultation: Due to the lack of free air or free flow of contrast and the peritoneum there is no indication for any surgical intervention on this. Would recommend pulling back the feeding tube 1-2 cm. Would recheck an abdominal film to confirm no pneumoperitoneum in the morning. Absence of any changes okay to continue using Dobbhoff for feeding and medications. 10/28 gastrograffin study: The study confirms that the feeding tube pes perforated through the duodenum and the tip is within a cavity that fills with injected contrast. The cavity does appear walled off 11/4 refuses oral feeds, continue TF's CT reviewed: T8-T9 discitis/osteomyelitis, RT HIP FRACTURE 11/5 placed back on vasopressors, placed back on Vent due to resp acidosis, levophed turned off 11/6 afternoon 11/5 PM Trach changed out due to cuff leak, #6 Shiley cuffed, 11/6 dubhoff occluded, removed and replaced, new dubhoff shows tube in the antral stomach 11/15 refusing to take oral feeds 11/18 placed back on Vent for increased WOB,SOB. 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 10/18/2015; the patient developed a new left basal pneumonia;  with recurrent sepsis.  10/19/2015; fevers resolved with IV acetaminophen. Tube feeds changed from vital  to Jevity.    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 Pneumoniae, P. Mirabilis, VRE Wound 9/26 >> No growth Blood 9/23 >> NEG CDiff 9/27>>neg Stool Cx 10/15>> negative Cdiff 10/25>>neg Trach Aspirate 10/28>> light growth pseudomonas Blood 10/28 >> 1/2 GPC >>  Sputum cultures obtained 09/22/15 due to mucus plugging>>Pseudomonas BONE TISSUE Cx 11/3>>E.coli (ESBL), k. Pneumonia (daptomycin and septra) Bld Cx 11/27>>negative thus far.  Trach Asp 11/27>> grew out Pseudomonas, and Klebsiella pneumonia.  ANTIMICROBIALS:  Aztreonam 9/23 >> 9/24 Vanc 9/23 >> 9/25 Vanc 9/26>>9/27 Daptomycin 9/27>> 10/12, 10/28>> off Meropenem 9/23 >> 10/14, 10/28 >> 10/31 Septra 11/8>> Zosyn 11/27,>> 11/30. Levaquin 11/29>> 11/29. Ceftaz 12/1>>  VITAL SIGNS: Temp:  [97.9 F (36.6 C)-99 F (37.2 C)] 98.2 F (36.8 C) (12/03 0800) Pulse Rate:  [26-105] 92 (12/03 0900) Resp:  [18-30] 21 (12/03 0900) BP: (100-126)/(47-77) 115/66 mmHg (12/03 0900) SpO2:  [96 %-100 %] 96 % (12/03 0900) FiO2 (%):  [35 %] 35 % (12/03 0846) HEMODYNAMICS:   VENTILATOR SETTINGS: Vent Mode:  [-] PRVC FiO2 (%):  [35 %] 35 % Set Rate:  [16 bmp] 16 bmp Vt Set:  [500 mL] 500 mL PEEP:  [5 cmH20] 5 cmH20 Plateau Pressure:  [22 cmH20-23 cmH20] 22 cmH20 INTAKE / OUTPUT:  Intake/Output Summary (Last 24 hours) at 10/22/15 1124 Last data filed at 10/22/15 0900  Gross per 24 hour  Intake   1280 ml  Output   1077 ml  Net    203 ml   PHYSICAL EXAMINATION: Physical Examination:   VS: BP 115/66 mmHg  Pulse 92  Temp(Src) 98.2 F (36.8 C) (Axillary)  Resp 21  Ht 5'  9" (1.753 m)  Wt 213 lb 13.5 oz (97 kg)  BMI 31.57 kg/m2  SpO2 96%  LMP  (LMP Unknown)  General Appearance: No resp distress, coarse upper airway sounds, on vent Neuro: profoundly diffusely weak, alert today, nodding head HEENT: cushingoid facies, PERRLA, EOM intact Neck: trach site clean Pulmonary: clear anteriorly Cardiovascular: reg, + syst murmur Abdomen: soft, NT, +BS Extremities: warm, R>L UE edema    LABORATORY PANEL:   CBC  Recent Labs Lab 10/22/15 0457  WBC 11.9*  HGB 7.8*  HCT 25.2*  PLT 170    Chemistries   Recent Labs Lab 10/21/15 1720  NA 138  K 4.8  CL 97*  CO2 33*  GLUCOSE 139*  BUN 69*  CREATININE 1.06*  CALCIUM 8.3*  PHOS 3.5  3.6       IMPRESSION/PLAN (current major issues):  Recurrent sepsis, due to pneumonia.   Anemia with macrocytosis. -Uncertain if this is due to chronic slow GI bleed or simple critical care associated anemia. She has already receiving folate, thiamine, and multivitamin. Her anemia has not responded to oral medications, this may be due to her GI malabsorption.  Pneumonia with sepsis-better. -Chest x-ray shows new left lower lobe infiltrate. Sputum cultures show pseudomonas. The patient is once again, vent dependent. She has developed again recurrent sepsis.Marland Kitchen Sputum cultures showing Pseudomonas, and Klebsiella, currently covered with this. Ceftaz. Today has a good respiratory status compared  to other days, FiO2 is down to 35%, will try a PSV mode for about 10-15 minutes, if she tolerates this well, then will give a trach collar trial for 30-60 minutes max  Worsening sacral decubitus ulcer, stage IV. -The patient is already had a sacral bone scraping, and has been placed on Septra for antibiotic, per ID recommendations. Her sacral decubitus does not appear to be improving and in fact appears to be worsening. In addition, she has several new smaller areas of tissue breakdown.  End-stage renal disease. -Continue on  hemodialysis per nephrology.  Diarrhea. -This has improved since changing her loperamide 2 twice daily scheduled, and changing her tube feeds back to Jevity. Continue loperamide to when necessary.  Patient appears to be stable for transfer to an LTAC facility at this point in time.   Additional Chronic issues and complicating factors during this admission.  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  Thrombocytopenia, resolved - HIT panel negative 9/25 Recurrent Severe sepsis, due to sacral pressure ulcer DM 2, controlled.  Chronic steroid therapy, for a history of of adrenal insufficiency Incidental finding of R hip fx - conservative mgmt. Waxing and waning encephalopathy - she was evaluated by both the psychiatry and neurology services, and it was deemed that the patient lacks decisional capacity at this time. 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 Husband declined TEE Profound deconditioning-criticall illness neuropathy Chronic pain   Husband updated at bedside.   Stephanie AcreVishal Chayah Mckee, MD [place] [facility] Pager [phone]- [phone] (please enter 7-digits) On Call Pager - [phone] (please enter 7-digits)  Critical Care Attestation.  I have personally obtained a history, examined the patient, evaluated laboratory and imaging results, formulated the assessment and plan and placed orders. The case was discussed with the critical care RN, nutrition, respiratory therapist, Associate Professorunit manager. The Patient requires high complexity decision making for assessment and support, frequent evaluation and titration of therapies, application of advanced monitoring technologies and extensive interpretation of multiple databases. The patient has critical illness that could lead imminently to failure of 1 or more organ systems and requires the highest level of physician preparedness to intervene.  Critical Care Time devoted to patient  care services described in this note is 35 minutes and is exclusive of time spent in procedures.

## 2015-10-22 NOTE — Progress Notes (Signed)
Subjective:  Patient had hemodialysis yesterday. Tolerated well. Remains on the ventilator at present. FiO2 currently 35%. Husband at bedside and updated as to the patient's condition.  Objective:  Vital signs in last 24 hours:  Temp:  [97.8 F (36.6 C)-99 F (37.2 C)] 98.2 F (36.8 C) (12/03 0800) Pulse Rate:  [26-105] 92 (12/03 0900) Resp:  [18-30] 21 (12/03 0900) BP: (100-126)/(47-77) 115/66 mmHg (12/03 0900) SpO2:  [96 %-100 %] 96 % (12/03 0900) FiO2 (%):  [35 %] 35 % (12/03 0846)  Weight change:  Filed Weights    Intake/Output: I/O last 3 completed shifts: In: 1970 [I.V.:20; NG/GT:1800; IV Piggyback:150] Out: 1077 [Other:1077]   Intake/Output this shift:  Total I/O In: 170 [NG/GT:170] Out: -   Physical Exam: General: critically ill appearing  Head: NG in place, OM moist  Eyes: anicteric  Neck: Tracheostomy in place  Lungs:  Scattered rhonchi, vent assisted fio2 35%, tachypneic  Heart: S1S2 no rubs  Abdomen:  Soft, nontender  Extremities: 1+ dependent edema, anasarca  Neurologic: Awake, not consistently following commands  Access:  R IJ permcath.       Basic Metabolic Panel:  Recent Labs Lab 10/15/15 1223  10/16/15 0625 10/17/15 1545 10/18/15 0640 10/19/15 0515 10/21/15 1720  NA  --   --  144 144 143 142 138  K  --   --  4.2 4.7 4.1 4.6 4.8  CL  --   --  102 101 102 100* 97*  CO2  --   --  32 33* 32 33* 33*  GLUCOSE  --   --  166* 153* 126* 183* 139*  BUN  --   --  66* 93* 57* 76* 69*  CREATININE  --   --  1.22* 1.52* 1.05* 1.27* 1.06*  CALCIUM  --   < > 8.7* 8.9 8.5* 8.5* 8.3*  PHOS 1.9*  --  2.2* 3.2  --   --  3.5  3.6  < > = values in this interval not displayed.  Liver Function Tests:  Recent Labs Lab 10/17/15 1545 10/21/15 1720  ALBUMIN 2.2* 2.0*   No results for input(s): LIPASE, AMYLASE in the last 168 hours. No results for input(s): AMMONIA in the last 168 hours.  CBC:  Recent Labs Lab 10/16/15 0625 10/17/15 1545  10/18/15 0640 10/19/15 0145 10/19/15 0515 10/21/15 1720 10/21/15 2301 10/22/15 0457  WBC 11.6* 13.5* 11.3*  --  12.0* 11.4*  --  11.9*  NEUTROABS 9.9*  --   --   --   --   --   --   --   HGB 7.4* 7.3* 6.6* 7.0* 7.4* 6.7* 8.2* 7.8*  HCT 24.6* 24.6* 21.8* 22.5* 24.0* 22.2* 26.1* 25.2*  MCV 101.1* 101.5* 100.4*  --  96.6 96.7  --  95.3  PLT 169 166 151  --  145* 172  --  170    Cardiac Enzymes: No results for input(s): CKTOTAL, CKMB, CKMBINDEX, TROPONINI in the last 168 hours.  BNP: Invalid input(s): POCBNP  CBG:  Recent Labs Lab 10/21/15 1604 10/21/15 1931 10/21/15 2329 10/22/15 0334 10/22/15 0715  GLUCAP 132* 104* 140* 134* 118*    Microbiology: Results for orders placed or performed during the hospital encounter of 07/30/2015  Blood Culture (routine x 2)     Status: None   Collection Time: 08/08/2015  8:51 AM  Result Value Ref Range Status   Specimen Description BLOOD Dolores HooseUNKO  Final   Special Requests BOTTLES DRAWN AEROBIC AND ANAEROBIC  3CC  Final   Culture NO GROWTH 5 DAYS  Final   Report Status 08/17/2015 FINAL  Final  Blood Culture (routine x 2)     Status: None   Collection Time: 09/01/2015  9:20 AM  Result Value Ref Range Status   Specimen Description BLOOD LEFT ARM  Final   Special Requests BOTTLES DRAWN AEROBIC AND ANAEROBIC  1CC  Final   Culture NO GROWTH 5 DAYS  Final   Report Status 08/17/2015 FINAL  Final  Wound culture     Status: None   Collection Time: 09/01/15  9:20 AM  Result Value Ref Range Status   Specimen Description DECUBITIS  Final   Special Requests Normal  Final   Gram Stain   Final    FEW WBC SEEN MANY GRAM NEGATIVE RODS RARE GRAM POSITIVE COCCI    Culture   Final    HEAVY GROWTH ESCHERICHIA COLI MODERATE GROWTH ENTEROBACTER AEROGENES PROTEUS MIRABILIS HEAVY GROWTH ENTEROCOCCUS SPECIES VRE HAVE INTRINSIC RESISTANCE TO MOST COMMONLY USED ANTIBIOTICS AND THE ABILITY TO ACQUIRE RESISTANCE TO MOST AVAILABLE ANTIBIOTICS.    Report Status  08/16/2015 FINAL  Final   Organism ID, Bacteria ESCHERICHIA COLI  Final   Organism ID, Bacteria ENTEROBACTER AEROGENES  Final   Organism ID, Bacteria PROTEUS MIRABILIS  Final   Organism ID, Bacteria ENTEROCOCCUS SPECIES  Final      Susceptibility   Enterobacter aerogenes - MIC*    CEFTAZIDIME <=1 SENSITIVE Sensitive     CEFAZOLIN >=64 RESISTANT Resistant     CEFTRIAXONE <=1 SENSITIVE Sensitive     CIPROFLOXACIN <=0.25 SENSITIVE Sensitive     GENTAMICIN <=1 SENSITIVE Sensitive     IMIPENEM 1 SENSITIVE Sensitive     TRIMETH/SULFA <=20 SENSITIVE Sensitive     * MODERATE GROWTH ENTEROBACTER AEROGENES   Escherichia coli - MIC*    AMPICILLIN <=2 SENSITIVE Sensitive     CEFTAZIDIME <=1 SENSITIVE Sensitive     CEFAZOLIN <=4 SENSITIVE Sensitive     CEFTRIAXONE <=1 SENSITIVE Sensitive     CIPROFLOXACIN <=0.25 SENSITIVE Sensitive     GENTAMICIN <=1 SENSITIVE Sensitive     IMIPENEM <=0.25 SENSITIVE Sensitive     TRIMETH/SULFA <=20 SENSITIVE Sensitive     * HEAVY GROWTH ESCHERICHIA COLI   Proteus mirabilis - MIC*    AMPICILLIN >=32 RESISTANT Resistant     CEFTAZIDIME <=1 SENSITIVE Sensitive     CEFAZOLIN 8 SENSITIVE Sensitive     CEFTRIAXONE <=1 SENSITIVE Sensitive     CIPROFLOXACIN <=0.25 SENSITIVE Sensitive     GENTAMICIN <=1 SENSITIVE Sensitive     IMIPENEM 1 SENSITIVE Sensitive     TRIMETH/SULFA <=20 SENSITIVE Sensitive     * PROTEUS MIRABILIS   Enterococcus species - MIC*    AMPICILLIN >=32 RESISTANT Resistant     VANCOMYCIN >=32 RESISTANT Resistant     GENTAMICIN SYNERGY SENSITIVE Sensitive     TETRACYCLINE Value in next row Resistant      RESISTANT>=16    * HEAVY GROWTH ENTEROCOCCUS SPECIES  MRSA PCR Screening     Status: None   Collection Time: 09/01/2015  2:38 PM  Result Value Ref Range Status   MRSA by PCR NEGATIVE NEGATIVE Final    Comment:        The GeneXpert MRSA Assay (FDA approved for NASAL specimens only), is one component of a comprehensive MRSA  colonization surveillance program. It is not intended to diagnose MRSA infection nor to guide or monitor treatment for MRSA infections.   Blood culture (single)  Status: None   Collection Time: 08/08/2015  3:36 PM  Result Value Ref Range Status   Specimen Description BLOOD RIGHT ASSIST CONTROL  Final   Special Requests BOTTLES DRAWN AEROBIC AND ANAEROBIC  Final   Culture NO GROWTH 5 DAYS  Final   Report Status 08/17/2015 FINAL  Final  Wound culture     Status: None   Collection Time: 08/13/15 12:37 PM  Result Value Ref Range Status   Specimen Description WOUND  Final   Special Requests Normal  Final   Gram Stain   Final    FEW WBC SEEN TOO NUMEROUS TO COUNT GRAM NEGATIVE RODS FEW GRAM POSITIVE COCCI    Culture   Final    HEAVY GROWTH ESCHERICHIA COLI MODERATE GROWTH PROTEUS MIRABILIS LIGHT GROWTH KLEBSIELLA PNEUMONIAE MODERATE GROWTH ENTEROCOCCUS GALLINARUM CRITICAL RESULT CALLED TO, READ BACK BY AND VERIFIED WITH: [facility] BORBA AT 1042 08/16/15 DV    Report Status 08/17/2015 FINAL  Final   Organism ID, Bacteria ESCHERICHIA COLI  Final   Organism ID, Bacteria PROTEUS MIRABILIS  Final   Organism ID, Bacteria KLEBSIELLA PNEUMONIAE  Final   Organism ID, Bacteria ENTEROCOCCUS GALLINARUM  Final      Susceptibility   Escherichia coli - MIC*    AMPICILLIN >=32 RESISTANT Resistant     CEFTAZIDIME 4 RESISTANT Resistant     CEFAZOLIN >=64 RESISTANT Resistant     CEFTRIAXONE 16 RESISTANT Resistant     GENTAMICIN 2 SENSITIVE Sensitive     IMIPENEM >=16 RESISTANT Resistant     TRIMETH/SULFA <=20 SENSITIVE Sensitive     Extended ESBL POSITIVE Resistant     PIP/TAZO Value in next row Resistant      RESISTANT>=128    CIPROFLOXACIN Value in next row Sensitive      SENSITIVE<=0.25    * HEAVY GROWTH ESCHERICHIA COLI   Klebsiella pneumoniae - MIC*    AMPICILLIN Value in next row Resistant      SENSITIVE<=0.25    CEFTAZIDIME Value in next row Resistant      SENSITIVE<=0.25     CEFAZOLIN Value in next row Resistant      SENSITIVE<=0.25    CEFTRIAXONE Value in next row Resistant      SENSITIVE<=0.25    CIPROFLOXACIN Value in next row Resistant      SENSITIVE<=0.25    GENTAMICIN Value in next row Sensitive      SENSITIVE<=0.25    IMIPENEM Value in next row Resistant      SENSITIVE<=0.25    TRIMETH/SULFA Value in next row Resistant      SENSITIVE<=0.25    PIP/TAZO Value in next row Resistant      RESISTANT>=128    * LIGHT GROWTH KLEBSIELLA PNEUMONIAE   Proteus mirabilis - MIC*    AMPICILLIN Value in next row Resistant      RESISTANT>=128    CEFTAZIDIME Value in next row Sensitive      RESISTANT>=128    CEFAZOLIN Value in next row Sensitive      RESISTANT>=128    CEFTRIAXONE Value in next row Sensitive      RESISTANT>=128    CIPROFLOXACIN Value in next row Sensitive      RESISTANT>=128    GENTAMICIN Value in next row Sensitive      RESISTANT>=128    IMIPENEM Value in next row Sensitive      RESISTANT>=128    TRIMETH/SULFA Value in next row Sensitive      RESISTANT>=128    PIP/TAZO Value in next row  Sensitive      SENSITIVE<=4    * MODERATE GROWTH PROTEUS MIRABILIS   Enterococcus gallinarum - MIC*    AMPICILLIN Value in next row Resistant      SENSITIVE<=4    GENTAMICIN SYNERGY Value in next row Sensitive      SENSITIVE<=4    CIPROFLOXACIN Value in next row Resistant      RESISTANT>=8    TETRACYCLINE Value in next row Resistant      RESISTANT>=16    * MODERATE GROWTH ENTEROCOCCUS GALLINARUM  Wound culture     Status: None   Collection Time: 08/15/15  2:53 PM  Result Value Ref Range Status   Specimen Description WOUND  Final   Special Requests NONE  Final   Gram Stain FEW WBC SEEN NO ORGANISMS SEEN   Final   Culture NO GROWTH 3 DAYS  Final   Report Status 08/18/2015 FINAL  Final  C difficile quick scan w PCR reflex     Status: None   Collection Time: 08/17/15 11:34 AM  Result Value Ref Range Status   C Diff antigen NEGATIVE NEGATIVE Final    C Diff toxin NEGATIVE NEGATIVE Final   C Diff interpretation Negative for C. difficile  Final  Stool culture     Status: None   Collection Time: 09/03/15  3:51 PM  Result Value Ref Range Status   Specimen Description STOOL  Final   Special Requests Immunocompromised  Final   Culture   Final    NO SALMONELLA OR SHIGELLA ISOLATED No Pathogenic E. coli detected NO CAMPYLOBACTER DETECTED    Report Status 09/07/2015 FINAL  Final  C difficile quick scan w PCR reflex     Status: None   Collection Time: 09/13/15 12:51 AM  Result Value Ref Range Status   C Diff antigen NEGATIVE NEGATIVE Final   C Diff toxin NEGATIVE NEGATIVE Final   C Diff interpretation Negative for C. difficile  Final  Culture, blood (routine x 2)     Status: None   Collection Time: 09/16/15 11:24 AM  Result Value Ref Range Status   Specimen Description BLOOD LEFT HAND  Final   Special Requests BOTTLES DRAWN AEROBIC AND ANAEROBIC  1CC  Final   Culture NO GROWTH 5 DAYS  Final   Report Status 09/21/2015 FINAL  Final  Culture, blood (routine x 2)     Status: None   Collection Time: 09/16/15 12:23 PM  Result Value Ref Range Status   Specimen Description BLOOD RIGHT HAND  Final   Special Requests BOTTLES DRAWN AEROBIC AND ANAEROBIC  1CC  Final   Culture  Setup Time   Final    GRAM POSITIVE COCCI AEROBIC BOTTLE ONLY CRITICAL RESULT CALLED TO, READ BACK BY AND VERIFIED WITH: TESS THOMAS,RN 09/17/2015 0631 BY JRS.    Culture   Final    ENTEROCOCCUS FAECALIS AEROBIC BOTTLE ONLY VRE HAVE INTRINSIC RESISTANCE TO MOST COMMONLY USED ANTIBIOTICS AND THE ABILITY TO ACQUIRE RESISTANCE TO MOST AVAILABLE ANTIBIOTICS. CRITICAL RESULT CALLED TO, READ BACK BY AND VERIFIED WITH: CHERYL SMITH AT 5409 09/19/15 DV    Report Status 09/21/2015 FINAL  Final   Organism ID, Bacteria ENTEROCOCCUS FAECALIS  Final      Susceptibility   Enterococcus faecalis - MIC*    AMPICILLIN <=2 SENSITIVE Sensitive     LINEZOLID 2 SENSITIVE Sensitive      CIPROFLOXACIN Value in next row Resistant      RESISTANT>=8    TETRACYCLINE Value in next row Resistant  RESISTANT>=16    VANCOMYCIN Value in next row Resistant      RESISTANT>=32    GENTAMICIN SYNERGY Value in next row Resistant      RESISTANT>=32    * ENTEROCOCCUS FAECALIS  Culture, respiratory (NON-Expectorated)     Status: None   Collection Time: 09/16/15  3:50 PM  Result Value Ref Range Status   Specimen Description TRACHEAL ASPIRATE  Final   Special Requests Immunocompromised  Final   Gram Stain   Final    FEW WBC SEEN GOOD SPECIMEN - 80-90% WBCS RARE GRAM NEGATIVE RODS    Culture LIGHT GROWTH PSEUDOMONAS AERUGINOSA  Final   Report Status 09/19/2015 FINAL  Final   Organism ID, Bacteria PSEUDOMONAS AERUGINOSA  Final      Susceptibility   Pseudomonas aeruginosa - MIC*    CEFTAZIDIME 8 SENSITIVE Sensitive     CIPROFLOXACIN 2 INTERMEDIATE Intermediate     GENTAMICIN >=16 RESISTANT Resistant     IMIPENEM >=16 RESISTANT Resistant     PIP/TAZO Value in next row Sensitive      SENSITIVE32    CEFEPIME Value in next row Sensitive      SENSITIVE8    LEVOFLOXACIN Value in next row Resistant      RESISTANT>=8    * LIGHT GROWTH PSEUDOMONAS AERUGINOSA  Culture, expectorated sputum-assessment     Status: None   Collection Time: 09/22/15  2:09 PM  Result Value Ref Range Status   Specimen Description ENDOTRACHEAL  Final   Special Requests Normal  Final   Sputum evaluation THIS SPECIMEN IS ACCEPTABLE FOR SPUTUM CULTURE  Final   Report Status 09/24/2015 FINAL  Final  Culture, respiratory (NON-Expectorated)     Status: None   Collection Time: 09/22/15  2:09 PM  Result Value Ref Range Status   Specimen Description ENDOTRACHEAL  Final   Special Requests Normal Reflexed from [id number]  Final   Gram Stain   Final    FEW WBC SEEN FEW GRAM NEGATIVE RODS POOR SPECIMEN - LESS THAN 70% WBCS    Culture   Final    MODERATE GROWTH PSEUDOMONAS AERUGINOSA LIGHT GROWTH KLEBSIELLA  PNEUMONIAE REFER TO SENSITIVITIES FROM PREVIOUS CULTURE FOR ORG 2    Report Status 10/01/2015 FINAL  Final   Organism ID, Bacteria PSEUDOMONAS AERUGINOSA  Final   Organism ID, Bacteria KLEBSIELLA PNEUMONIAE  Final      Susceptibility   Pseudomonas aeruginosa - MIC*    CEFTAZIDIME 4 SENSITIVE Sensitive     CIPROFLOXACIN 2 INTERMEDIATE Intermediate     GENTAMICIN >=16 RESISTANT Resistant     IMIPENEM >=16 RESISTANT Resistant     PIP/TAZO Value in next row Sensitive      SENSITIVE16    * MODERATE GROWTH PSEUDOMONAS AERUGINOSA  Tissue culture     Status: None   Collection Time: 09/24/15  6:44 AM  Result Value Ref Range Status   Specimen Description BONE  Final   Special Requests Normal  Final   Gram Stain MODERATE WBC SEEN FEW GRAM NEGATIVE RODS   Final   Culture   Final    MODERATE GROWTH ESCHERICHIA COLI MODERATE GROWTH KLEBSIELLA PNEUMONIAE ESBL-EXTENDED SPECTRUM BETA LACTAMASE-THE ORGANISM IS RESISTANT TO PENICILLINS, CEPHALOSPORINS AND AZTREONAM ACCORDING TO CLSI M100-S15 VOL.25 N01 JAN 2005. ORGANISM 1 This organism isolate is resistant to one or more antiotic agents in three or more antimicrobial categories.  Suggest Infectious Disease consult.   ORGANISM 2 CRITICAL RESULT CALLED TO, READ BACK BY AND VERIFIED WITH: RN Mardene Celeste [facility] 09/27/15 1005AM  Report Status 09/28/2015 FINAL  Final   Organism ID, Bacteria ESCHERICHIA COLI  Final   Organism ID, Bacteria KLEBSIELLA PNEUMONIAE  Final      Susceptibility   Escherichia coli - MIC*    AMPICILLIN >=32 RESISTANT Resistant     CEFTAZIDIME 4 RESISTANT Resistant     CEFAZOLIN >=64 RESISTANT Resistant     CEFTRIAXONE 8 RESISTANT Resistant     GENTAMICIN <=1 SENSITIVE Sensitive     IMIPENEM 8 RESISTANT Resistant     TRIMETH/SULFA <=20 SENSITIVE Sensitive     Extended ESBL POSITIVE Resistant     PIP/TAZO Value in next row Resistant      RESISTANT>=128    * MODERATE GROWTH ESCHERICHIA COLI   Klebsiella pneumoniae - MIC*     AMPICILLIN Value in next row Resistant      RESISTANT>=128    CEFTAZIDIME Value in next row Resistant      RESISTANT>=128    CEFAZOLIN Value in next row Resistant      RESISTANT>=128    CEFTRIAXONE Value in next row Resistant      RESISTANT>=128    CIPROFLOXACIN Value in next row Resistant      RESISTANT>=128    GENTAMICIN Value in next row Resistant      RESISTANT>=128    IMIPENEM Value in next row Resistant      RESISTANT>=128    TRIMETH/SULFA Value in next row Sensitive      RESISTANT>=128    PIP/TAZO Value in next row Resistant      RESISTANT>=128    * MODERATE GROWTH KLEBSIELLA PNEUMONIAE  Anaerobic culture     Status: None   Collection Time: 09/24/15  3:55 PM  Result Value Ref Range Status   Specimen Description BONE  Final   Special Requests Normal  Final   Culture NO ANAEROBES ISOLATED  Final   Report Status 09/29/2015 FINAL  Final  Culture, fungus without smear     Status: None   Collection Time: 09/24/15  3:55 PM  Result Value Ref Range Status   Specimen Description BONE  Final   Special Requests Normal  Final   Culture NO FUNGUS ISOLATED AFTER 24 DAYS  Final   Report Status 10/18/2015 FINAL  Final  C difficile quick scan w PCR reflex     Status: None   Collection Time: 10/07/15  2:02 PM  Result Value Ref Range Status   C Diff antigen NEGATIVE NEGATIVE Final   C Diff toxin NEGATIVE NEGATIVE Final   C Diff interpretation Negative for C. difficile  Final  Culture, blood (routine x 2)     Status: None   Collection Time: 10/16/15  9:52 AM  Result Value Ref Range Status   Specimen Description BLOOD LEFT HAND  Final   Special Requests   Final    BOTTLES DRAWN AEROBIC AND ANAEROBIC  AER 6CC ANA 4CC   Culture NO GROWTH 6 DAYS  Final   Report Status 10/22/2015 FINAL  Final  Culture, blood (routine x 2)     Status: None   Collection Time: 10/16/15 12:25 PM  Result Value Ref Range Status   Specimen Description BLOOD LEFT HAND  Final   Special Requests BOTTLES DRAWN  AEROBIC AND ANAEROBIC  5CC  Final   Culture NO GROWTH 6 DAYS  Final   Report Status 10/22/2015 FINAL  Final  Culture, expectorated sputum-assessment     Status: None   Collection Time: 10/18/15  1:15 PM  Result Value Ref Range Status  Specimen Description SPUTUM  Final   Special Requests NONE  Final   Sputum evaluation THIS SPECIMEN IS ACCEPTABLE FOR SPUTUM CULTURE  Final   Report Status 10/18/2015 FINAL  Final  Culture, respiratory (NON-Expectorated)     Status: None (Preliminary result)   Collection Time: 10/18/15  1:15 PM  Result Value Ref Range Status   Specimen Description SPUTUM  Final   Special Requests NONE Reflexed from [id number]  Final   Gram Stain   Final    GOOD SPECIMEN - 80-90% WBCS MODERATE WBC SEEN MANY GRAM NEGATIVE RODS    Culture   Final    HEAVY GROWTH PSEUDOMONAS AERUGINOSA MODERATE GROWTH KLEBSIELLA PNEUMONIAE REPEATING SUSCEPTIBILITES FOR PSEUDOMONAS CRITICAL RESULT CALLED TO, READ BACK BY AND VERIFIED WITHRod Mae AT 7829 10/21/15 DV KLEBSIELLA PNEUMONIAE This organism isolate is resistant to one or more antiotic agents in three or more antimicrobial categories.  Suggest Infectious Disease  consult.      Report Status PENDING  Incomplete   Organism ID, Bacteria PSEUDOMONAS AERUGINOSA  Final   Organism ID, Bacteria KLEBSIELLA PNEUMONIAE  Final      Susceptibility   Klebsiella pneumoniae - MIC*    AMPICILLIN >=32 RESISTANT Resistant     CEFAZOLIN >=64 RESISTANT Resistant     CEFTRIAXONE >=64 RESISTANT Resistant     CIPROFLOXACIN >=4 RESISTANT Resistant     GENTAMICIN >=16 RESISTANT Resistant     IMIPENEM >=16 RESISTANT Resistant     NITROFURANTOIN >=512 RESISTANT Resistant     TRIMETH/SULFA <=20 SENSITIVE Sensitive     * MODERATE GROWTH KLEBSIELLA PNEUMONIAE  C difficile quick scan w PCR reflex     Status: None   Collection Time: 10/18/15  4:39 PM  Result Value Ref Range Status   C Diff antigen NEGATIVE NEGATIVE Final   C Diff toxin NEGATIVE  NEGATIVE Final   C Diff interpretation Negative for C. difficile  Final    Coagulation Studies: No results for input(s): LABPROT, INR in the last 72 hours.  Urinalysis: No results for input(s): COLORURINE, LABSPEC, PHURINE, GLUCOSEU, HGBUR, BILIRUBINUR, KETONESUR, PROTEINUR, UROBILINOGEN, NITRITE, LEUKOCYTESUR in the last 72 hours.  Invalid input(s): APPERANCEUR    Imaging: No results found.   Medications:   . feeding supplement (JEVITY 1.5 CAL/FIBER) 1,000 mL (10/22/15 0512)  . norepinephrine Stopped (10/08/15 0530)   . amantadine  200 mg Per Tube Q7 days  . antiseptic oral rinse  7 mL Mouth Rinse QID  . aspirin  81 mg Oral Daily  . cefTAZidime (FORTAZ)  IV  1 g Intravenous Q24H  . chlorhexidine gluconate  15 mL Mouth Rinse BID  . epoetin (EPOGEN/PROCRIT) injection  10,000 Units Intravenous Q M,W,F-HD  . escitalopram  10 mg Oral Daily  . feeding supplement (PRO-STAT SUGAR FREE 64)  30 mL Oral 6 X Daily  . folic acid  1 mg Oral Daily  . free water  20 mL Per Tube 6 times per day  . heparin subcutaneous  5,000 Units Subcutaneous Q12H  . hydrocortisone  10 mg Per Tube BID  . insulin aspart  0-9 Units Subcutaneous 6 times per day  . ipratropium-albuterol  3 mL Nebulization QID  . lidocaine  1 patch Transdermal Q24H  . midodrine  5 mg Per Tube TID WC  . multivitamin  5 mL Oral Daily  . pantoprazole sodium  40 mg Per Tube Daily  . saccharomyces boulardii  250 mg Oral BID  . sodium chloride  10-40 mL Intracatheter Q12H  .  sulfamethoxazole-trimethoprim  2.5 tablet Per Tube q1800  . thiamine  100 mg Per Tube Daily   sodium chloride, sodium chloride, acetaminophen (TYLENOL) oral liquid 160 mg/5 mL, HYDROmorphone (DILAUDID) injection, loperamide, morphine injection, ondansetron (ZOFRAN) IV, oxyCODONE, sodium chloride, zinc oxide  Assessment/ Plan:  49 y.o. 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 Admitted on Aug 25, 2015  1. End-stage renal disease on hemodialysis on HD MWF. The patient has been on dialysis since October of 2014. R IJ permcath. Dialysis treatment with IV albumin for oncotic support.  -  Patient completed dialysis yesterday. No acute indication today. Next dialysis on Monday.   2. Anemia of CKD. Most recent hemoglobin was found to be 7.8 posttransfusion. Continue to monitor CBC and continue Epogen for anemia of chronic kidney disease.  3. Secondary hyperparathyroidism: PTH low at 55 (9/25) - Phosphorous 3.5 and acceptable. She remains off Binder therapy.    4. Sepsis: VRE in blood. Bone cultures E. Coli and klebsiella - Treatment as per ID and ICU team: daptomycin completed on 11/10 and now on trimethoprim/sulfamethoxazole and levaquin. -Pseudomonas growing in the sputum. Continue ceftazidime.  5. Generalized Dependent edema/Anasarca due to 3rd spacing -  Continue conservative ultrafiltration with dialysis.  6. Acute resp failure - New infiltrate was noted in the left lower lobe on Xray. -Pseudomonas noted in the sputum.  -Continue ventilator support, ceftazidime added to medication regimen.   LOS: 71 Courtney [name] 12/3/201610:12 AM

## 2015-10-22 NOTE — Progress Notes (Signed)
Patient is alert and able to nod/shake head in response to questions. Medicated for pain x 1 today with improvement as well as premedication for dressing change. Tolerating tube feedings. Husband at bedside in afternoon. Resting quietly at this time. Will continue to monitor.

## 2015-10-22 NOTE — Progress Notes (Addendum)
MEDICATION RELATED CONSULT NOTE - Follow up   Pharmacy Consult for Renal dosing  Allergies  Allergen Reactions  . Contrast Media [Iodinated Diagnostic Agents] Anaphylaxis  . Ampicillin Rash    Patient Measurements: Height:  (SCALE BROKE) Weight:  (scales on bed not working) IBW/kg (Calculated) : 66.2  Vital Signs: Temp: 98.2 F (36.8 C) (12/03 0800) Temp Source: Axillary (12/03 0800) BP: 115/66 mmHg (12/03 0900) Pulse Rate: 92 (12/03 0900)  Recent Labs  10/21/15 1720 10/21/15 2301 10/22/15 0457  WBC 11.4*  --  11.9*  HGB 6.7* 8.2* 7.8*  HCT 22.2* 26.1* 25.2*  PLT 172  --  170  CREATININE 1.06*  --   --   PHOS 3.5  3.6  --   --   ALBUMIN 2.0*  --   --    Estimated Creatinine Clearance: 78.7 mL/min (by C-G formula based on Cr of 1.06).   Assessment: 49 yo patient on HD. Pharmacy consulted for renal dosing of medications.   Plan:  No medications require adjustment at present. Pharmacy will continue to monitor patients medications and dose adjust as needed.    Luisa HartScott Annayah Worthley, PharmD  10/22/2015

## 2015-10-22 NOTE — Progress Notes (Signed)
ANTIBIOTIC CONSULT NOTE - Follow up  Pharmacy Consult for Ceftazidime  Indication: pneumonia  Allergies  Allergen Reactions  . Contrast Media [Iodinated Diagnostic Agents] Anaphylaxis  . Ampicillin Rash   Patient Measurements: Height:  (SCALE BROKE) Weight:  (scales on bed not working) IBW/kg (Calculated) : 66.2  Vital Signs: Temp: 98.2 F (36.8 C) (12/03 0800) Temp Source: Axillary (12/03 0800) BP: 115/66 mmHg (12/03 0900) Pulse Rate: 92 (12/03 0900) Intake/Output from previous day: 12/02 0701 - 12/03 0700 In: 1310 [NG/GT:1160; IV Piggyback:150] Out: 1077    Recent Labs  10/21/15 1720 10/21/15 2301 10/22/15 0457  WBC 11.4*  --  11.9*  HGB 6.7* 8.2* 7.8*  PLT 172  --  170  CREATININE 1.06*  --   --    Estimated Creatinine Clearance: 78.7 mL/min (by C-G formula based on Cr of 1.06).   Microbiology: Recent Results (from the past 720 hour(s))  Culture, expectorated sputum-assessment     Status: None   Collection Time: 09/22/15  2:09 PM  Result Value Ref Range Status   Specimen Description ENDOTRACHEAL  Final   Special Requests Normal  Final   Sputum evaluation THIS SPECIMEN IS ACCEPTABLE FOR SPUTUM CULTURE  Final   Report Status 09/24/2015 FINAL  Final  Culture, respiratory (NON-Expectorated)     Status: None   Collection Time: 09/22/15  2:09 PM  Result Value Ref Range Status   Specimen Description ENDOTRACHEAL  Final   Special Requests Normal Reflexed from [id number]  Final   Gram Stain   Final    FEW WBC SEEN FEW GRAM NEGATIVE RODS POOR SPECIMEN - LESS THAN 70% WBCS    Culture   Final    MODERATE GROWTH PSEUDOMONAS AERUGINOSA LIGHT GROWTH KLEBSIELLA PNEUMONIAE REFER TO SENSITIVITIES FROM PREVIOUS CULTURE FOR ORG 2    Report Status 10/01/2015 FINAL  Final   Organism ID, Bacteria PSEUDOMONAS AERUGINOSA  Final   Organism ID, Bacteria KLEBSIELLA PNEUMONIAE  Final      Susceptibility   Pseudomonas aeruginosa - MIC*    CEFTAZIDIME 4 SENSITIVE Sensitive      CIPROFLOXACIN 2 INTERMEDIATE Intermediate     GENTAMICIN >=16 RESISTANT Resistant     IMIPENEM >=16 RESISTANT Resistant     PIP/TAZO Value in next row Sensitive      SENSITIVE16    * MODERATE GROWTH PSEUDOMONAS AERUGINOSA  Tissue culture     Status: None   Collection Time: 09/24/15  6:44 AM  Result Value Ref Range Status   Specimen Description BONE  Final   Special Requests Normal  Final   Gram Stain MODERATE WBC SEEN FEW GRAM NEGATIVE RODS   Final   Culture   Final    MODERATE GROWTH ESCHERICHIA COLI MODERATE GROWTH KLEBSIELLA PNEUMONIAE ESBL-EXTENDED SPECTRUM BETA LACTAMASE-THE ORGANISM IS RESISTANT TO PENICILLINS, CEPHALOSPORINS AND AZTREONAM ACCORDING TO CLSI M100-S15 VOL.25 N01 JAN 2005. ORGANISM 1 This organism isolate is resistant to one or more antiotic agents in three or more antimicrobial categories.  Suggest Infectious Disease consult.   ORGANISM 2 CRITICAL RESULT CALLED TO, READ BACK BY AND VERIFIED WITH: RN Mardene Celeste LINDSAY 09/27/15 1005AM    Report Status 09/28/2015 FINAL  Final   Organism ID, Bacteria ESCHERICHIA COLI  Final   Organism ID, Bacteria KLEBSIELLA PNEUMONIAE  Final      Susceptibility   Escherichia coli - MIC*    AMPICILLIN >=32 RESISTANT Resistant     CEFTAZIDIME 4 RESISTANT Resistant     CEFAZOLIN >=64 RESISTANT Resistant     CEFTRIAXONE  8 RESISTANT Resistant     GENTAMICIN <=1 SENSITIVE Sensitive     IMIPENEM 8 RESISTANT Resistant     TRIMETH/SULFA <=20 SENSITIVE Sensitive     Extended ESBL POSITIVE Resistant     PIP/TAZO Value in next row Resistant      RESISTANT>=128    * MODERATE GROWTH ESCHERICHIA COLI   Klebsiella pneumoniae - MIC*    AMPICILLIN Value in next row Resistant      RESISTANT>=128    CEFTAZIDIME Value in next row Resistant      RESISTANT>=128    CEFAZOLIN Value in next row Resistant      RESISTANT>=128    CEFTRIAXONE Value in next row Resistant      RESISTANT>=128    CIPROFLOXACIN Value in next row Resistant       RESISTANT>=128    GENTAMICIN Value in next row Resistant      RESISTANT>=128    IMIPENEM Value in next row Resistant      RESISTANT>=128    TRIMETH/SULFA Value in next row Sensitive      RESISTANT>=128    PIP/TAZO Value in next row Resistant      RESISTANT>=128    * MODERATE GROWTH KLEBSIELLA PNEUMONIAE  Anaerobic culture     Status: None   Collection Time: 09/24/15  3:55 PM  Result Value Ref Range Status   Specimen Description BONE  Final   Special Requests Normal  Final   Culture NO ANAEROBES ISOLATED  Final   Report Status 09/29/2015 FINAL  Final  Culture, fungus without smear     Status: None   Collection Time: 09/24/15  3:55 PM  Result Value Ref Range Status   Specimen Description BONE  Final   Special Requests Normal  Final   Culture NO FUNGUS ISOLATED AFTER 24 DAYS  Final   Report Status 10/18/2015 FINAL  Final  C difficile quick scan w PCR reflex     Status: None   Collection Time: 10/07/15  2:02 PM  Result Value Ref Range Status   C Diff antigen NEGATIVE NEGATIVE Final   C Diff toxin NEGATIVE NEGATIVE Final   C Diff interpretation Negative for C. difficile  Final  Culture, blood (routine x 2)     Status: None   Collection Time: 10/16/15  9:52 AM  Result Value Ref Range Status   Specimen Description BLOOD LEFT HAND  Final   Special Requests   Final    BOTTLES DRAWN AEROBIC AND ANAEROBIC  AER 6CC ANA 4CC   Culture NO GROWTH 6 DAYS  Final   Report Status 10/22/2015 FINAL  Final  Culture, blood (routine x 2)     Status: None   Collection Time: 10/16/15 12:25 PM  Result Value Ref Range Status   Specimen Description BLOOD LEFT HAND  Final   Special Requests BOTTLES DRAWN AEROBIC AND ANAEROBIC  5CC  Final   Culture NO GROWTH 6 DAYS  Final   Report Status 10/22/2015 FINAL  Final  Culture, expectorated sputum-assessment     Status: None   Collection Time: 10/18/15  1:15 PM  Result Value Ref Range Status   Specimen Description SPUTUM  Final   Special Requests NONE   Final   Sputum evaluation THIS SPECIMEN IS ACCEPTABLE FOR SPUTUM CULTURE  Final   Report Status 10/18/2015 FINAL  Final  Culture, respiratory (NON-Expectorated)     Status: None (Preliminary result)   Collection Time: 10/18/15  1:15 PM  Result Value Ref Range Status   Specimen  Description SPUTUM  Final   Special Requests NONE Reflexed from [id number]  Final   Gram Stain   Final    GOOD SPECIMEN - 80-90% WBCS MODERATE WBC SEEN MANY GRAM NEGATIVE RODS    Culture   Final    HEAVY GROWTH PSEUDOMONAS AERUGINOSA MODERATE GROWTH KLEBSIELLA PNEUMONIAE REPEATING SUSCEPTIBILITES FOR PSEUDOMONAS CRITICAL RESULT CALLED TO, READ BACK BY AND VERIFIED WITHRod Mae: JOANNA LINDSAY AT [id number] 10/21/15 DV KLEBSIELLA PNEUMONIAE This organism isolate is resistant to one or more antiotic agents in three or more antimicrobial categories.  Suggest Infectious Disease  consult.      Report Status PENDING  Incomplete   Organism ID, Bacteria PSEUDOMONAS AERUGINOSA  Final   Organism ID, Bacteria KLEBSIELLA PNEUMONIAE  Final      Susceptibility   Klebsiella pneumoniae - MIC*    AMPICILLIN >=32 RESISTANT Resistant     CEFAZOLIN >=64 RESISTANT Resistant     CEFTRIAXONE >=64 RESISTANT Resistant     CIPROFLOXACIN >=4 RESISTANT Resistant     GENTAMICIN >=16 RESISTANT Resistant     IMIPENEM >=16 RESISTANT Resistant     NITROFURANTOIN >=512 RESISTANT Resistant     TRIMETH/SULFA <=20 SENSITIVE Sensitive     * MODERATE GROWTH KLEBSIELLA PNEUMONIAE  C difficile quick scan w PCR reflex     Status: None   Collection Time: 10/18/15  4:39 PM  Result Value Ref Range Status   C Diff antigen NEGATIVE NEGATIVE Final   C Diff toxin NEGATIVE NEGATIVE Final   C Diff interpretation Negative for C. difficile  Final   Assessment: 49 yo female with ESRD receiving HD every MWF. Pharmacy consulted for dosing and monitoring of ceftazidime.   Patients sputum cultures showing heavy growth of Pseudomonas. Susceptibilities still  pending.    Susceptibilities from 10/01/15 shows sensitivity to ceftazidime and Zosyn. Patient has ampicillin allergy.   Plan:  Will continue patient on Ceftazidime 1g every 24 hours for HD dosing.  Pharmacy will continue to monitor labs and susceptibilities.  Luisa HartScott Kayline Sheer, PharmD   10/22/2015 10:19 AM

## 2015-10-23 LAB — GLUCOSE, CAPILLARY
GLUCOSE-CAPILLARY: 111 mg/dL — AB (ref 65–99)
GLUCOSE-CAPILLARY: 127 mg/dL — AB (ref 65–99)
GLUCOSE-CAPILLARY: 147 mg/dL — AB (ref 65–99)
Glucose-Capillary: 146 mg/dL — ABNORMAL HIGH (ref 65–99)
Glucose-Capillary: 127 mg/dL — ABNORMAL HIGH (ref 65–99)

## 2015-10-23 MED ORDER — ALBUMIN HUMAN 25 % IV SOLN
25.0000 g | Freq: Once | INTRAVENOUS | Status: AC
  Administered 2015-10-24: 25 g via INTRAVENOUS
  Filled 2015-10-23 (×2): qty 100

## 2015-10-23 MED ORDER — DAKINS (1/4 STRENGTH) 0.125 % EX SOLN
Freq: Two times a day (BID) | CUTANEOUS | Status: AC
  Administered 2015-10-23: 1
  Administered 2015-10-23 – 2015-10-25 (×3)
  Filled 2015-10-23 (×2): qty 473

## 2015-10-23 NOTE — Progress Notes (Signed)
[facility] Medicine Progess Note  Name: Courtney GrinderHedda McMillian [place] MRN: [id number] DOB: 1966/07/14    ADMISSION DATE:  07/28/2015   INITIAL PRESENTATION:  49 AAF who has been in medical facilities (hosp, LTAC, rehab) for 2 yrs following gastric bypass surgery with multiple complications. Now with chronic trach, ESRD, profound debilitation, severe sacral pressure ulcer. Was seemingly making progress and transferred to rehab facility approx one week prior to this admission. [facility] with AMS and hypotension. Working dx of severe sepsis/septic shock due to infected sacral pressure ulcer. Since admission. Her course is been very complicated with numerous complications including septic shock and GI bleeding.    INTERVAL HISTORY: The patient is arousable, does respond to my questions today. She appears to be breathing comfortably on the ventilator. She is awake. On 35% FiO2, PRVC, PEEP 5.  Tolerated about 3 hrs of PSV (10/5) yesterday.     Summary of MAJOR EVENTS/TEST RESULTS: Admission 02/07/14-05/07/14 Admission 07/21/14-09/06/14 Discharged to [facility]. Pt had palliative consult at that time, were asked to sign off by husband.  09/23 CT head: NAD 09/23 EEG: no epileptiform activity 09/23 PRBCs for Hgb 6.4 09/24 bedside debridement of sacral wound. Abscess drained 09/25 Off vasopressors. More alert. No distress. Worsening thrombocytopenia. Vanc DC'd 09/29  Dr. [name] (I.D) excused from the case by patient's husband. 10/02 MRI -multiple infarcts 10/03 tracheal bleeding- transfused platelets 10/03 hospitalist service excused from the case by patient's husband 10/03 Echocardiogram ejection fraction was 55-60%, pulmonary systolic pressure was 39 mmHg 78/2910/08 restart TF's at lower rate, attempt reg HD  10/12 Transferred to med-surg floor. Remains on PCCM service 10/14 SLP eval: pt unable to tolerate PMV adequately 10/17-will re-attempt PM valve-discussed with Speech  therapist 10/18 passed swallow eval-start pureed thick foods no thin liquids-continue NG feeds 10/19 transferred to step down for sepsis/aspiration pneumonia 10/19 cxr shows RLL opacity 10/21 sacral decub debride at bedside by surgery 10/22 started back on vasopressors while on HD 10/27 CT with osteo, R hip fx, unable to identify tip of dubhoff tube - sent for fluoro study 10/28 Ortho consultation: I do not feel that she is a surgical candidate. Therefore, I feel that it would best to manage this fracture nonsurgically and allow it to heal by itself over time, which it should.  10/28 Gen Surg consultation: Due to the lack of free air or free flow of contrast and the peritoneum there is no indication for any surgical intervention on this. Would recommend pulling back the feeding tube 1-2 cm. Would recheck an abdominal film to confirm no pneumoperitoneum in the morning. Absence of any changes okay to continue using Dobbhoff for feeding and medications. 10/28 gastrograffin study: The study confirms that the feeding tube pes perforated through the duodenum and the tip is within a cavity that fills with injected contrast. The cavity does appear walled off 11/4 refuses oral feeds, continue TF's CT reviewed: T8-T9 discitis/osteomyelitis, RT HIP FRACTURE 11/5 placed back on vasopressors, placed back on Vent due to resp acidosis, levophed turned off 11/6 afternoon 11/5 PM Trach changed out due to cuff leak, #6 Shiley cuffed, 11/6 dubhoff occluded, removed and replaced, new dubhoff shows tube in the antral stomach 11/15 refusing to take oral feeds 11/18 placed back on Vent for increased WOB,SOB. 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 10/18/2015; the patient developed a new left basal pneumonia; with recurrent sepsis.  10/19/2015; fevers resolved with IV acetaminophen. Tube feeds changed from vital to Jevity.    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 Pneumoniae, P. Mirabilis, VRE Wound 9/26 >> No growth Blood 9/23 >> NEG CDiff 9/27>>neg Stool Cx 10/15>> negative Cdiff 10/25>>neg Trach Aspirate 10/28>> light growth pseudomonas Blood 10/28 >> 1/2 GPC >>  Sputum cultures obtained 09/22/15 due to mucus plugging>>Pseudomonas BONE TISSUE Cx 11/3>>E.coli (ESBL), k. Pneumonia (daptomycin and septra) Bld Cx 11/27>>negative thus far.  Trach Asp 11/27>> grew out Pseudomonas, and Klebsiella pneumonia.  ANTIMICROBIALS:  Aztreonam 9/23 >> 9/24 Vanc 9/23 >> 9/25 Vanc 9/26>>9/27 Daptomycin 9/27>> 10/12, 10/28>> off Meropenem 9/23 >> 10/14, 10/28 >> 10/31 Septra 11/8>> Zosyn 11/27,>> 11/30. Levaquin 11/29>> 11/29. Ceftaz 12/1>>  VITAL SIGNS: Temp:  [98.4 F (36.9 C)-99.8 F (37.7 C)] 99.8 F (37.7 C) (12/04 0736) Pulse Rate:  [92-118] 104 (12/04 0700) Resp:  [21-41] 32 (12/04 0700) BP: (109-133)/(54-78) 133/78 mmHg (12/04 0700) SpO2:  [91 %-99 %] 97 % (12/04 0700) FiO2 (%):  [35 %] 35 % (12/04 0733) HEMODYNAMICS:   VENTILATOR SETTINGS: Vent Mode:  [-] PRVC FiO2 (%):  [35 %] 35 % Set Rate:  [16 bmp] 16 bmp Vt Set:  [500 mL] 500 mL PEEP:  [5 cmH20] 5 cmH20 Pressure Support:  [10 cmH20] 10 cmH20 INTAKE / OUTPUT:  Intake/Output Summary (Last 24 hours) at 10/23/15 0830 Last data filed at 10/23/15 0700  Gross per 24 hour  Intake   1300 ml  Output      0 ml  Net   1300 ml   PHYSICAL EXAMINATION: Physical Examination:   VS: BP 133/78 mmHg  Pulse 104  Temp(Src) 99.8 F (37.7 C) (Oral)  Resp 32  Ht  (1.753 m)  Wt 213 lb 13.5 oz (97  kg)  BMI 31.57 kg/m2  SpO2 97%  LMP  (LMP Unknown)  General Appearance: No resp distress, coarse upper airway sounds, on vent Neuro: profoundly diffusely weak, alert today, nodding head HEENT: cushingoid facies, PERRLA, EOM intact Neck: trach site clean Pulmonary: clear anteriorly Cardiovascular: reg, + syst murmur Abdomen: soft, NT, +BS Extremities: warm, R>L UE edema    LABORATORY PANEL:   CBC  Recent Labs Lab 10/22/15 0457  WBC 11.9*  HGB 7.8*  HCT 25.2*  PLT 170    Chemistries   Recent Labs Lab 10/21/15 1720  NA 138  K 4.8  CL 97*  CO2 33*  GLUCOSE 139*  BUN 69*  CREATININE 1.06*  CALCIUM 8.3*  PHOS 3.5  3.6     IMPRESSION/PLAN (current major issues):  Recurrent sepsis, due to pneumonia.   Anemia with macrocytosis. -Uncertain if this is due to chronic slow GI bleed or simple critical care associated anemia. She has already receiving folate, thiamine, and multivitamin. Her anemia has not responded to oral medications, this may be due to her GI malabsorption.  Pneumonia with sepsis-better. -Chest x-ray shows new left lower lobe infiltrate. Sputum cultures show pseudomonas. The patient is once again, vent dependent. She has developed again recurrent sepsis.Marland Kitchen Sputum cultures showing Pseudomonas, and Klebsiella, currently covered with this. Ceftaz. Will try another round of PSV followed by TCT today. Tolerated PSV (  10/5) for 3 hrs yesterday.  Worsening sacral decubitus ulcer, stage IV. -The patient is already had a sacral bone scraping, and has been placed on Septra for antibiotic, per ID recommendations. Her sacral decubitus does not appear to be improving and in fact appears to be worsening. In addition, she has several new smaller areas of tissue breakdown.  End-stage renal disease. -Continue on hemodialysis per nephrology.  Diarrhea. -This has improved since changing her loperamide 2 twice daily scheduled, and changing her tube feeds back to Jevity.  Continue loperamide to when necessary.  Patient appears to be stable for transfer to an LTAC facility at this point in time.   Additional Chronic issues and complicating factors during this admission.  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  Thrombocytopenia, resolved - HIT panel negative 9/25 Recurrent Severe sepsis, due to sacral pressure ulcer DM 2, controlled.  Chronic steroid therapy, for a history of of adrenal insufficiency Incidental finding of R hip fx - conservative mgmt. Waxing and waning encephalopathy - she was evaluated by both the psychiatry and neurology services, and it was deemed that the patient lacks decisional capacity at this time. 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 Husband declined TEE Profound deconditioning-criticall illness neuropathy Chronic pain   Husband updated at phone number [phone].   Stephanie Acre, MD [facility] Pager [phone] (please enter 7-digits) On Call Pager - [phone] (please enter 7-digits)  Critical Care Attestation.  I have personally obtained a history, examined the patient, evaluated laboratory and imaging results, formulated the assessment and plan and placed orders. The case was discussed with the critical care RN, nutrition, respiratory therapist, Associate Professor. The Patient requires high complexity decision making for assessment and support, frequent evaluation and titration of therapies, application of advanced monitoring technologies and extensive interpretation of multiple databases. The patient has critical illness that could lead imminently to failure of 1 or more organ systems and requires the highest level of physician preparedness to intervene.  Critical Care Time devoted to patient care services described in this note is 35 minutes and is exclusive of time spent in procedures.

## 2015-10-23 NOTE — Progress Notes (Signed)
ANTIBIOTIC CONSULT NOTE - Follow up  Pharmacy Consult for Ceftazidime  Indication: pneumonia  Allergies  Allergen Reactions  . Contrast Media [Iodinated Diagnostic Agents] Anaphylaxis  . Ampicillin Rash   Patient Measurements: Height:  (SCALE BROKE) Weight:  (scales on bed not working) IBW/kg (Calculated) : 66.2  Vital Signs: Temp: 100.8 F (38.2 C) (12/04 1000) Temp Source: Oral (12/04 1000) BP: 136/82 mmHg (12/04 1000) Pulse Rate: 105 (12/04 1000) Intake/Output from previous day: 12/03 0701 - 12/04 0700 In: 1420 [NG/GT:1370; IV Piggyback:50] Out: -    Recent Labs  10/21/15 1720 10/21/15 2301 10/22/15 0457  WBC 11.4*  --  11.9*  HGB 6.7* 8.2* 7.8*  PLT 172  --  170  CREATININE 1.06*  --   --    Estimated Creatinine Clearance: 78.7 mL/min (by C-G formula based on Cr of 1.06).   Microbiology: Recent Results (from the past 720 hour(s))  Tissue culture     Status: None   Collection Time: 09/24/15  6:44 AM  Result Value Ref Range Status   Specimen Description BONE  Final   Special Requests Normal  Final   Gram Stain MODERATE WBC SEEN FEW GRAM NEGATIVE RODS   Final   Culture   Final    MODERATE GROWTH ESCHERICHIA COLI MODERATE GROWTH KLEBSIELLA PNEUMONIAE ESBL-EXTENDED SPECTRUM BETA LACTAMASE-THE ORGANISM IS RESISTANT TO PENICILLINS, CEPHALOSPORINS AND AZTREONAM ACCORDING TO CLSI M100-S15 VOL.25 N01 JAN 2005. ORGANISM 1 This organism isolate is resistant to one or more antiotic agents in three or more antimicrobial categories.  Suggest Infectious Disease consult.   ORGANISM 2 CRITICAL RESULT CALLED TO, READ BACK BY AND VERIFIED WITH: RN Mardene Celeste LINDSAY 09/27/15 1005AM    Report Status 09/28/2015 FINAL  Final   Organism ID, Bacteria ESCHERICHIA COLI  Final   Organism ID, Bacteria KLEBSIELLA PNEUMONIAE  Final      Susceptibility   Escherichia coli - MIC*    AMPICILLIN >=32 RESISTANT Resistant     CEFTAZIDIME 4 RESISTANT Resistant     CEFAZOLIN >=64 RESISTANT  Resistant     CEFTRIAXONE 8 RESISTANT Resistant     GENTAMICIN <=1 SENSITIVE Sensitive     IMIPENEM 8 RESISTANT Resistant     TRIMETH/SULFA <=20 SENSITIVE Sensitive     Extended ESBL POSITIVE Resistant     PIP/TAZO Value in next row Resistant      RESISTANT>=128    * MODERATE GROWTH ESCHERICHIA COLI   Klebsiella pneumoniae - MIC*    AMPICILLIN Value in next row Resistant      RESISTANT>=128    CEFTAZIDIME Value in next row Resistant      RESISTANT>=128    CEFAZOLIN Value in next row Resistant      RESISTANT>=128    CEFTRIAXONE Value in next row Resistant      RESISTANT>=128    CIPROFLOXACIN Value in next row Resistant      RESISTANT>=128    GENTAMICIN Value in next row Resistant      RESISTANT>=128    IMIPENEM Value in next row Resistant      RESISTANT>=128    TRIMETH/SULFA Value in next row Sensitive      RESISTANT>=128    PIP/TAZO Value in next row Resistant      RESISTANT>=128    * MODERATE GROWTH KLEBSIELLA PNEUMONIAE  Anaerobic culture     Status: None   Collection Time: 09/24/15  3:55 PM  Result Value Ref Range Status   Specimen Description BONE  Final   Special Requests Normal  Final  Culture NO ANAEROBES ISOLATED  Final   Report Status 09/29/2015 FINAL  Final  Culture, fungus without smear     Status: None   Collection Time: 09/24/15  3:55 PM  Result Value Ref Range Status   Specimen Description BONE  Final   Special Requests Normal  Final   Culture NO FUNGUS ISOLATED AFTER 24 DAYS  Final   Report Status 10/18/2015 FINAL  Final  C difficile quick scan w PCR reflex     Status: None   Collection Time: 10/07/15  2:02 PM  Result Value Ref Range Status   C Diff antigen NEGATIVE NEGATIVE Final   C Diff toxin NEGATIVE NEGATIVE Final   C Diff interpretation Negative for C. difficile  Final  Culture, blood (routine x 2)     Status: None   Collection Time: 10/16/15  9:52 AM  Result Value Ref Range Status   Specimen Description BLOOD LEFT HAND  Final   Special  Requests   Final    BOTTLES DRAWN AEROBIC AND ANAEROBIC  AER 6CC ANA 4CC   Culture NO GROWTH 6 DAYS  Final   Report Status 10/22/2015 FINAL  Final  Culture, blood (routine x 2)     Status: None   Collection Time: 10/16/15 12:25 PM  Result Value Ref Range Status   Specimen Description BLOOD LEFT HAND  Final   Special Requests BOTTLES DRAWN AEROBIC AND ANAEROBIC  5CC  Final   Culture NO GROWTH 6 DAYS  Final   Report Status 10/22/2015 FINAL  Final  Culture, expectorated sputum-assessment     Status: None   Collection Time: 10/18/15  1:15 PM  Result Value Ref Range Status   Specimen Description SPUTUM  Final   Special Requests NONE  Final   Sputum evaluation THIS SPECIMEN IS ACCEPTABLE FOR SPUTUM CULTURE  Final   Report Status 10/18/2015 FINAL  Final  Culture, respiratory (NON-Expectorated)     Status: None   Collection Time: 10/18/15  1:15 PM  Result Value Ref Range Status   Specimen Description SPUTUM  Final   Special Requests NONE Reflexed from [id number]  Final   Gram Stain   Final    GOOD SPECIMEN - 80-90% WBCS MODERATE WBC SEEN MANY GRAM NEGATIVE RODS    Culture   Final    HEAVY GROWTH PSEUDOMONAS AERUGINOSA MODERATE GROWTH KLEBSIELLA PNEUMONIAE CRITICAL RESULT CALLED TO, READ BACK BY AND VERIFIED WITHRod Mae: JOANNA LINDSAY AT [id number] 10/21/15 DV KLEBSIELLA PNEUMONIAE This organism isolate is resistant to one or more antiotic agents in three or more antimicrobial categories.  Suggest Infectious Disease  consult.      Report Status 10/22/2015 FINAL  Final   Organism ID, Bacteria PSEUDOMONAS AERUGINOSA  Final   Organism ID, Bacteria KLEBSIELLA PNEUMONIAE  Final      Susceptibility   Klebsiella pneumoniae - MIC*    AMPICILLIN >=32 RESISTANT Resistant     CEFAZOLIN >=64 RESISTANT Resistant     CEFTRIAXONE >=64 RESISTANT Resistant     CIPROFLOXACIN >=4 RESISTANT Resistant     GENTAMICIN >=16 RESISTANT Resistant     IMIPENEM >=16 RESISTANT Resistant     NITROFURANTOIN >=512 RESISTANT  Resistant     TRIMETH/SULFA <=20 SENSITIVE Sensitive     * MODERATE GROWTH KLEBSIELLA PNEUMONIAE   Pseudomonas aeruginosa - MIC*    CEFTAZIDIME 4 SENSITIVE Sensitive     CIPROFLOXACIN >=4 RESISTANT Resistant     GENTAMICIN 8 INTERMEDIATE Intermediate     IMIPENEM >=16 RESISTANT Resistant  PIP/TAZO Value in next row Sensitive      SENSITIVE32    * HEAVY GROWTH PSEUDOMONAS AERUGINOSA  C difficile quick scan w PCR reflex     Status: None   Collection Time: 10/18/15  4:39 PM  Result Value Ref Range Status   C Diff antigen NEGATIVE NEGATIVE Final   C Diff toxin NEGATIVE NEGATIVE Final   C Diff interpretation Negative for C. difficile  Final   Assessment: 49 yo female with ESRD receiving HD every MWF. Pharmacy consulted for dosing and monitoring of ceftazidime.   Patients sputum cultures showing heavy growth of Pseudomonas. Susceptibilities still  pending.   Susceptibilities from 10/01/15 shows sensitivity to ceftazidime and Zosyn. Patient has ampicillin allergy.   Plan:  Will continue patient on Ceftazidime 1g every 24 hours for HD dosing.  Pharmacy will continue to monitor labs and susceptibilities.  Luisa Hart, PharmD   10/23/2015 10:41 AM

## 2015-10-23 NOTE — Plan of Care (Signed)
Problem: Phase I Progression Outcomes Goal: Progress activity as tolerated unless otherwise ordered Outcome: Progressing PT in to work with patient.  PROM done x3 today.  Very minimal muscle tension with legs and right arm noted during ROM exercises.. Goal: Discharge plan established Outcome: Not Progressing See Care Manager notes Goal: Tolerating diet Outcome: Progressing Tolerating Jevity at 5750ml/hr.  One  loose stool today  Problem: Phase II Progression Outcomes Goal: Pain controlled on oral analgesia Outcome: Progressing Dilaudid and oxycodone for pain.  Goal: Dyspnea controlled w/progressive activity Outcome: Progressing Tried on 35% /5p/10ps for one hour by RT.  Pt became more tachypneic and labored. Placed back on rate. Secretions are greenish from trach and from around trach site. On [place] Goal: ADLs completed with minimal assistance Outcome: Not Progressing Maximum assist with ADL's Goal: Activity at appropriate level-compared to baseline (UP IN CHAIR FOR HEMODIALYSIS)  Outcome: Progressing Bedrest with rotation on. PT  in to work with patient.  PROM done.  Minimal muscle tension of legs and left arm with PROM. Goal: Tolerating diet Outcome: Progressing Tolerating TF at 50 ml /hr. One loose stool today Goal: Other Phase II Outcomes/Goals Outcome: Progressing Wound care to sacrum done x 2 with wet to dry Dakins today. Wound with 90% granulation and 10% yellow slough.  Edges are rolled and tunneling noted.

## 2015-10-23 NOTE — Plan of Care (Deleted)
Problem: Phase I Progression Outcomes Goal: Flu/PneumoVaccines if indicated Outcome: Progressing Pt wants to take at discharge when feeling better Goal: Progress activity as tolerated unless otherwise ordered [facility]  Steady on feet.  Sats down to 80's with exertional dyspnea Goal: Discharge plan established Outcome: Progressing Plans to return home with husband Goal: Tolerating diet Outcome: Progressing Appetite fairly good. 75 % of trays taken  Problem: Phase II Progression Outcomes Goal: Pain controlled on oral analgesia Outcome: Progressing Norco scheduled relieves chestpain to a "2" Goal: Dyspnea controlled w/progressive activity Outcome: Progressing Pt and family state pt is much less dyspneic today. HFNC weaned to 65 % with sats 90-92%. Unable to maintain good sats at 60%.  Dry NPC.  Lungs with bibasilar fine crackles posteriorly. Posterior upper lung fields sound clear. Good diuresis with Lasix.  Pt states doctor told her she had some underlying lung disease besides her PNA. She has more questions about that for doctor in am.  Goal: ADLs completed with minimal assistance Outcome: Progressing Minimal assist with bath. Tolerated fairly well.

## 2015-10-23 NOTE — Progress Notes (Signed)
MEDICATION RELATED CONSULT NOTE - Follow up   Pharmacy Consult for Renal dosing  Allergies  Allergen Reactions  . Contrast Media [Iodinated Diagnostic Agents] Anaphylaxis  . Ampicillin Rash    Patient Measurements: Height:  (SCALE BROKE) Weight:  (scales on bed not working) IBW/kg (Calculated) : 66.2  Vital Signs: Temp: 100.8 F (38.2 C) (12/04 1000) Temp Source: Oral (12/04 1000) BP: 136/82 mmHg (12/04 1000) Pulse Rate: 105 (12/04 1000)  Recent Labs  10/21/15 1720 10/21/15 2301 10/22/15 0457  WBC 11.4*  --  11.9*  HGB 6.7* 8.2* 7.8*  HCT 22.2* 26.1* 25.2*  PLT 172  --  170  CREATININE 1.06*  --   --   PHOS 3.5  3.6  --   --   ALBUMIN 2.0*  --   --    Estimated Creatinine Clearance: 78.7 mL/min (by C-G formula based on Cr of 1.06).   Assessment: 49 yo patient on HD. Pharmacy consulted for renal dosing of medications.   Plan:  No medications require adjustment at present. Pharmacy will continue to monitor patients medications and dose adjust as needed.    Courtney [name], PharmD  10/23/2015

## 2015-10-23 NOTE — Progress Notes (Signed)
Physical Therapy Treatment Patient Details Name: Courtney [name] MRN: [id number] DOB: 08/12/1966 Today's Date: 10/23/2015    History of Present Illness Pt is a [age] F who has been in medical facilities (hosp, LTAC, rehab) for 2 yrs following gastric bypass surgery with multiple complications. Now with chronic trach, ESRD, profound debilitation, severe sacral pressure ulcer (stage IV). Was seemingly making progress and transferred to rehab facility approx one week prior to this admission. [facility] with AMS and hypotension. Admitted with sepsis related to infected sacral ulcer.  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  Patient transferred to CCU to med-surg floor during hospitalization, but experienced profound interdialytic hypotension requiring return transfer to CCU for use of pressors (now off); remains in CCU.  Noted with R hip intertrochanteric fracture, recommended for conservative management per orthopedics.  Hospital course continues to be complicated with recurrent sepsis requiring patient on/off vent multiple times; currently on vent support with PEEP 5.  Limited ability/willingness to follow commands or actively participate in therapeutic interventions.  Per care team, physicians recommending hospice care/comfort measures at this time; husband considering goals of care/POC pending discussion with patient and other family members, but wishes continued medical care at this time. Will carefully monitor medical plan and POC to evaluate appropriateness of continued PT throughout remaining hospitalization.    PT Comments    Pt continues to be very limited with her ability to participate with even very limited AAROM exercises.  She appears more interested in participating with some L LE effort than some previous sessions  but her severe weakness makes this difficult to assess.  Pt with less desperate look in her eyes today and very little shaking her head no.  Follow Up Recommendations  LTACH;SNF     Equipment Recommendations       Recommendations for Other Services       Precautions / Restrictions Precautions Precautions: Fall Restrictions RLE Weight Bearing: Non weight bearing    Mobility  Bed Mobility               General bed mobility comments: Deferred/contraindicated  Transfers                    Ambulation/Gait                 Stairs            Wheelchair Mobility    Modified Rankin (Stroke Patients Only)       Balance                                    Cognition Arousal/Alertness: Awake/alert Behavior During Therapy: Flat affect Overall Cognitive Status: Difficult to assess                      Exercises General Exercises - Lower Extremity Ankle Circles/Pumps: Left;15 reps;AAROM Quad Sets: Strengthening;15 reps;Left Gluteal Sets: Strengthening;15 reps;Left Heel Slides: PROM;AAROM;10 reps;Left Hip ABduction/ADduction: 15 reps;Left;AAROM;PROM    General Comments        Pertinent Vitals/Pain Pain Location: Pt seems to indicate less pain than she normally does with L hip/LE exercises    Home Living  Prior Function            PT Goals (current goals can now be found in the care plan section)      Frequency  Min 2X/week    PT Plan Current plan remains appropriate    Co-evaluation             End of Session   Activity Tolerance:  (pt seems more receptive to exercising today ) Patient left: in bed     Time: 9604-5409 PT Time Calculation (min) (ACUTE ONLY): 12 min  Charges:  $Therapeutic Exercise: 8-22 mins                    G Codes:     Loran Senters, PT, DPT [phone]  Malachi Pro 10/23/2015, 7:16 PM

## 2015-10-24 LAB — RENAL FUNCTION PANEL
ALBUMIN: 1.9 g/dL — AB (ref 3.5–5.0)
ANION GAP: 6 (ref 5–15)
BUN: 71 mg/dL — AB (ref 6–20)
CALCIUM: 8.3 mg/dL — AB (ref 8.9–10.3)
CO2: 35 mmol/L — AB (ref 22–32)
CREATININE: 1.1 mg/dL — AB (ref 0.44–1.00)
Chloride: 99 mmol/L — ABNORMAL LOW (ref 101–111)
GFR calc Af Amer: >60 mL/min (ref >60)
GFR calc non Af Amer: 58 mL/min — ABNORMAL LOW (ref >60)
GLUCOSE: 132 mg/dL — AB (ref 65–99)
PHOSPHORUS: 3.9 mg/dL (ref 2.5–4.6)
Potassium: 4.8 mmol/L (ref 3.5–5.1)
SODIUM: 140 mmol/L (ref 135–145)

## 2015-10-24 LAB — GLUCOSE, CAPILLARY
GLUCOSE-CAPILLARY: 129 mg/dL — AB (ref 65–99)
GLUCOSE-CAPILLARY: 137 mg/dL — AB (ref 65–99)
GLUCOSE-CAPILLARY: 143 mg/dL — AB (ref 65–99)
Glucose-Capillary: 140 mg/dL — ABNORMAL HIGH (ref 65–99)
Glucose-Capillary: 96 mg/dL (ref 65–99)

## 2015-10-24 LAB — CBC
HCT: 24.9 % — ABNORMAL LOW (ref 35.0–47.0)
Hemoglobin: 7.6 g/dL — ABNORMAL LOW (ref 12.0–16.0)
MCH: 29.6 pg (ref 26.0–34.0)
MCHC: 30.5 g/dL — AB (ref 32.0–36.0)
MCV: 97.3 fL (ref 80.0–100.0)
PLATELETS: 183 10*3/uL (ref 150–440)
RBC: 2.56 MIL/uL — ABNORMAL LOW (ref 3.80–5.20)
RDW: 17.2 % — AB (ref 11.5–14.5)
WBC: 7.4 10*3/uL (ref 3.6–11.0)

## 2015-10-24 MED ORDER — LOPERAMIDE HCL 2 MG PO CAPS
2.0000 mg | ORAL_CAPSULE | Freq: Two times a day (BID) | ORAL | Status: DC
  Administered 2015-10-24 – 2015-10-28 (×9): 2 mg via ORAL
  Filled 2015-10-24 (×6): qty 1

## 2015-10-24 NOTE — Progress Notes (Signed)
MEDICATION RELATED CONSULT NOTE - Follow up   Pharmacy Consult for Renal dosing  Allergies  Allergen Reactions  . Contrast Media [Iodinated Diagnostic Agents] Anaphylaxis  . Ampicillin Rash    Patient Measurements: Height:  (SCALE BROKE) Weight:  (scale on bed not working) IBW/kg (Calculated) : 66.2  Vital Signs: Temp: 98.5 F (36.9 C) (12/05 0800) Temp Source: Oral (12/05 0800) BP: 120/63 mmHg (12/05 1100) Pulse Rate: 93 (12/05 1100)  Recent Labs  10/21/15 1720 10/21/15 2301 10/22/15 0457  WBC 11.4*  --  11.9*  HGB 6.7* 8.2* 7.8*  HCT 22.2* 26.1* 25.2*  PLT 172  --  170  CREATININE 1.06*  --   --   PHOS 3.5  3.6  --   --   ALBUMIN 2.0*  --   --    Estimated Creatinine Clearance: 78.7 mL/min (by C-G formula based on Cr of 1.06).   Assessment: 49 yo patient on HD. Pharmacy consulted for renal dosing of medications.   Plan:  No medications require adjustment at present. Pharmacy will continue to monitor patients medications and dose adjust as needed.    Demetrius Charityeldrin D. Shaindel Sweeten, PharmD  10/24/2015

## 2015-10-24 NOTE — Progress Notes (Signed)
ANTIBIOTIC CONSULT NOTE - Follow up  Pharmacy Consult for Ceftazidime  Indication: pneumonia  Allergies  Allergen Reactions  . Contrast Media [Iodinated Diagnostic Agents] Anaphylaxis  . Ampicillin Rash   Patient Measurements: Height:  (SCALE BROKE) Weight:  (scale on bed not working) IBW/kg (Calculated) : 66.2  Vital Signs: Temp: 98.5 F (36.9 C) (12/05 0800) Temp Source: Oral (12/05 0800) BP: 120/63 mmHg (12/05 1100) Pulse Rate: 93 (12/05 1100) Intake/Output from previous day: 12/04 0701 - 12/05 0700 In: 1270 [I.V.:10; NG/GT:1260] Out: -    Recent Labs  10/21/15 1720 10/21/15 2301 10/22/15 0457  WBC 11.4*  --  11.9*  HGB 6.7* 8.2* 7.8*  PLT 172  --  170  CREATININE 1.06*  --   --    Estimated Creatinine Clearance: 78.7 mL/min (by C-G formula based on Cr of 1.06).   Microbiology: Recent Results (from the past 720 hour(s))  Anaerobic culture     Status: None   Collection Time: 09/24/15  3:55 PM  Result Value Ref Range Status   Specimen Description BONE  Final   Special Requests Normal  Final   Culture NO ANAEROBES ISOLATED  Final   Report Status 09/29/2015 FINAL  Final  Culture, fungus without smear     Status: None   Collection Time: 09/24/15  3:55 PM  Result Value Ref Range Status   Specimen Description BONE  Final   Special Requests Normal  Final   Culture NO FUNGUS ISOLATED AFTER 24 DAYS  Final   Report Status 10/18/2015 FINAL  Final  C difficile quick scan w PCR reflex     Status: None   Collection Time: 10/07/15  2:02 PM  Result Value Ref Range Status   C Diff antigen NEGATIVE NEGATIVE Final   C Diff toxin NEGATIVE NEGATIVE Final   C Diff interpretation Negative for C. difficile  Final  Culture, blood (routine x 2)     Status: None   Collection Time: 10/16/15  9:52 AM  Result Value Ref Range Status   Specimen Description BLOOD LEFT HAND  Final   Special Requests   Final    BOTTLES DRAWN AEROBIC AND ANAEROBIC  AER 6CC ANA 4CC   Culture NO  GROWTH 6 DAYS  Final   Report Status 10/22/2015 FINAL  Final  Culture, blood (routine x 2)     Status: None   Collection Time: 10/16/15 12:25 PM  Result Value Ref Range Status   Specimen Description BLOOD LEFT HAND  Final   Special Requests BOTTLES DRAWN AEROBIC AND ANAEROBIC  5CC  Final   Culture NO GROWTH 6 DAYS  Final   Report Status 10/22/2015 FINAL  Final  Culture, expectorated sputum-assessment     Status: None   Collection Time: 10/18/15  1:15 PM  Result Value Ref Range Status   Specimen Description SPUTUM  Final   Special Requests NONE  Final   Sputum evaluation THIS SPECIMEN IS ACCEPTABLE FOR SPUTUM CULTURE  Final   Report Status 10/18/2015 FINAL  Final  Culture, respiratory (NON-Expectorated)     Status: None   Collection Time: 10/18/15  1:15 PM  Result Value Ref Range Status   Specimen Description SPUTUM  Final   Special Requests NONE Reflexed from [id number]  Final   Gram Stain   Final    GOOD SPECIMEN - 80-90% WBCS MODERATE WBC SEEN MANY GRAM NEGATIVE RODS    Culture   Final    HEAVY GROWTH PSEUDOMONAS AERUGINOSA MODERATE GROWTH KLEBSIELLA PNEUMONIAE CRITICAL RESULT  CALLED TO, READ BACK BY AND VERIFIED WITHRod Mae: JOANNA LINDSAY AT [id number] 10/21/15 DV KLEBSIELLA PNEUMONIAE This organism isolate is resistant to one or more antiotic agents in three or more antimicrobial categories.  Suggest Infectious Disease  consult.      Report Status 10/22/2015 FINAL  Final   Organism ID, Bacteria PSEUDOMONAS AERUGINOSA  Final   Organism ID, Bacteria KLEBSIELLA PNEUMONIAE  Final      Susceptibility   Klebsiella pneumoniae - MIC*    AMPICILLIN >=32 RESISTANT Resistant     CEFAZOLIN >=64 RESISTANT Resistant     CEFTRIAXONE >=64 RESISTANT Resistant     CIPROFLOXACIN >=4 RESISTANT Resistant     GENTAMICIN >=16 RESISTANT Resistant     IMIPENEM >=16 RESISTANT Resistant     NITROFURANTOIN >=512 RESISTANT Resistant     TRIMETH/SULFA <=20 SENSITIVE Sensitive     * MODERATE GROWTH KLEBSIELLA  PNEUMONIAE   Pseudomonas aeruginosa - MIC*    CEFTAZIDIME 4 SENSITIVE Sensitive     CIPROFLOXACIN >=4 RESISTANT Resistant     GENTAMICIN 8 INTERMEDIATE Intermediate     IMIPENEM >=16 RESISTANT Resistant     PIP/TAZO Value in next row Sensitive      SENSITIVE32    * HEAVY GROWTH PSEUDOMONAS AERUGINOSA  C difficile quick scan w PCR reflex     Status: None   Collection Time: 10/18/15  4:39 PM  Result Value Ref Range Status   C Diff antigen NEGATIVE NEGATIVE Final   C Diff toxin NEGATIVE NEGATIVE Final   C Diff interpretation Negative for C. difficile  Final   Assessment: 49 yo female with ESRD receiving HD every MWF. Pharmacy consulted for dosing and monitoring of ceftazidime.   Patients sputum cultures showing heavy growth of Pseudomonas and Klebsiella.  Suspectibilites show Pseudomonas is sensitive to Ceftazidime and Klebsiella is sensitive to Septra which is currently on board as well.   Plan:  Will continue patient on Ceftazidime 1g every 24 hours for HD dosing.  Pharmacy will continue to monitor labs.  Demetrius Charityeldrin D. Lissa Rowles, PharmD   10/24/2015 1:51 PM

## 2015-10-24 NOTE — Progress Notes (Signed)
Brief Nutrition Note:   Pt with 4 very large loose bowel movements per Selena BattenKim RN in the last 24 hours (as of 10 am ICU rounds, pt had 1 during day shift yesterday, 2 during night shift last night and 1 this AM before ICU rounds). Discussed during ICU rounds. MD [name] changed imodium prn orders to scheduled BID. Jevity 1.5 continues at 50 ml/hr, Prostat 6 times daily. No change in TF orders made by MD. Continue to follow  Romelle Starcherate Lavance Beazer MS, RD, LDN [phone] Pager

## 2015-10-24 NOTE — Progress Notes (Signed)
Subjective:   Hemodialysis for later today.  No family at bedside.  PRVC FIO2 35% Tube feeds with free water flushes.   Objective:  Vital signs in last 24 hours:  Temp:  [97.9 F (36.6 C)-100.8 F (38.2 C)] 98.7 F (37.1 C) (12/05 0400) Pulse Rate:  [88-111] 88 (12/05 0700) Resp:  [0-35] 18 (12/05 0700) BP: (116-147)/(49-82) 120/74 mmHg (12/05 0700) SpO2:  [92 %-100 %] 100 % (12/05 0700) FiO2 (%):  [35 %] 35 % (12/05 0700)  Weight change:  Filed Weights    Intake/Output: I/O last 3 completed shifts: In: 1910 [I.V.:10; NG/GT:1900] Out: -    Intake/Output this shift:     Physical Exam: General: critically ill appearing  Head: NG in place, OM moist  Eyes: anicteric  Neck: Tracheostomy in place  Lungs:  Scattered rhonchi, vent assisted fio2 35%,   Heart: S1S2 no rubs  Abdomen:  Soft, nontender  Extremities: 1+ dependent edema, anasarca  Neurologic: Awake, not consistently following commands  Access:  R IJ permcath.       Basic Metabolic Panel:  Recent Labs Lab 10/17/15 1545 10/18/15 0640 10/19/15 0515 10/21/15 1720  NA 144 143 142 138  K 4.7 4.1 4.6 4.8  CL 101 102 100* 97*  CO2 33* 32 33* 33*  GLUCOSE 153* 126* 183* 139*  BUN 93* 57* 76* 69*  CREATININE 1.52* 1.05* 1.27* 1.06*  CALCIUM 8.9 8.5* 8.5* 8.3*  PHOS 3.2  --   --  3.5  3.6    Liver Function Tests:  Recent Labs Lab 10/17/15 1545 10/21/15 1720  ALBUMIN 2.2* 2.0*   No results for input(s): LIPASE, AMYLASE in the last 168 hours. No results for input(s): AMMONIA in the last 168 hours.  CBC:  Recent Labs Lab 10/17/15 1545 10/18/15 0640 10/19/15 0145 10/19/15 0515 10/21/15 1720 10/21/15 2301 10/22/15 0457  WBC 13.5* 11.3*  --  12.0* 11.4*  --  11.9*  HGB 7.3* 6.6* 7.0* 7.4* 6.7* 8.2* 7.8*  HCT 24.6* 21.8* 22.5* 24.0* 22.2* 26.1* 25.2*  MCV 101.5* 100.4*  --  96.6 96.7  --  95.3  PLT 166 151  --  145* 172  --  170    Cardiac Enzymes: No results for input(s): CKTOTAL,  CKMB, CKMBINDEX, TROPONINI in the last 168 hours.  BNP: Invalid input(s): POCBNP  CBG:  Recent Labs Lab 10/23/15 1646 10/23/15 1957 10/24/15 0014 10/24/15 0358 10/24/15 0719  GLUCAP 147* 127* 129* 140* 96    Microbiology: Results for orders placed or performed during the hospital encounter of 09-10-15  Blood Culture (routine x 2)     Status: None   Collection Time: September 10, 2015  8:51 AM  Result Value Ref Range Status   Specimen Description BLOOD [name]  Final   Special Requests BOTTLES DRAWN AEROBIC AND ANAEROBIC  3CC  Final   Culture NO GROWTH 5 DAYS  Final   Report Status 08/17/2015 FINAL  Final  Blood Culture (routine x 2)     Status: None   Collection Time: 2015-09-10  9:20 AM  Result Value Ref Range Status   Specimen Description BLOOD LEFT ARM  Final   Special Requests BOTTLES DRAWN AEROBIC AND ANAEROBIC  1CC  Final   Culture NO GROWTH 5 DAYS  Final   Report Status 08/17/2015 FINAL  Final  Wound culture     Status: None   Collection Time: 09-10-15  9:20 AM  Result Value Ref Range Status   Specimen Description DECUBITIS  Final   Special  Requests Normal  Final   Gram Stain   Final    FEW WBC SEEN MANY GRAM NEGATIVE RODS RARE GRAM POSITIVE COCCI    Culture   Final    HEAVY GROWTH ESCHERICHIA COLI MODERATE GROWTH ENTEROBACTER AEROGENES PROTEUS MIRABILIS HEAVY GROWTH ENTEROCOCCUS SPECIES VRE HAVE INTRINSIC RESISTANCE TO MOST COMMONLY USED ANTIBIOTICS AND THE ABILITY TO ACQUIRE RESISTANCE TO MOST AVAILABLE ANTIBIOTICS.    Report Status 08/16/2015 FINAL  Final   Organism ID, Bacteria ESCHERICHIA COLI  Final   Organism ID, Bacteria ENTEROBACTER AEROGENES  Final   Organism ID, Bacteria PROTEUS MIRABILIS  Final   Organism ID, Bacteria ENTEROCOCCUS SPECIES  Final      Susceptibility   Enterobacter aerogenes - MIC*    CEFTAZIDIME <=1 SENSITIVE Sensitive     CEFAZOLIN >=64 RESISTANT Resistant     CEFTRIAXONE <=1 SENSITIVE Sensitive     CIPROFLOXACIN <=0.25 SENSITIVE  Sensitive     GENTAMICIN <=1 SENSITIVE Sensitive     IMIPENEM 1 SENSITIVE Sensitive     TRIMETH/SULFA <=20 SENSITIVE Sensitive     * MODERATE GROWTH ENTEROBACTER AEROGENES   Escherichia coli - MIC*    AMPICILLIN <=2 SENSITIVE Sensitive     CEFTAZIDIME <=1 SENSITIVE Sensitive     CEFAZOLIN <=4 SENSITIVE Sensitive     CEFTRIAXONE <=1 SENSITIVE Sensitive     CIPROFLOXACIN <=0.25 SENSITIVE Sensitive     GENTAMICIN <=1 SENSITIVE Sensitive     IMIPENEM <=0.25 SENSITIVE Sensitive     TRIMETH/SULFA <=20 SENSITIVE Sensitive     * HEAVY GROWTH ESCHERICHIA COLI   Proteus mirabilis - MIC*    AMPICILLIN >=32 RESISTANT Resistant     CEFTAZIDIME <=1 SENSITIVE Sensitive     CEFAZOLIN 8 SENSITIVE Sensitive     CEFTRIAXONE <=1 SENSITIVE Sensitive     CIPROFLOXACIN <=0.25 SENSITIVE Sensitive     GENTAMICIN <=1 SENSITIVE Sensitive     IMIPENEM 1 SENSITIVE Sensitive     TRIMETH/SULFA <=20 SENSITIVE Sensitive     * PROTEUS MIRABILIS   Enterococcus species - MIC*    AMPICILLIN >=32 RESISTANT Resistant     VANCOMYCIN >=32 RESISTANT Resistant     GENTAMICIN SYNERGY SENSITIVE Sensitive     TETRACYCLINE Value in next row Resistant      RESISTANT>=16    * HEAVY GROWTH ENTEROCOCCUS SPECIES  MRSA PCR Screening     Status: None   Collection Time: 07/26/2015  2:38 PM  Result Value Ref Range Status   MRSA by PCR NEGATIVE NEGATIVE Final    Comment:        The GeneXpert MRSA Assay (FDA approved for NASAL specimens only), is one component of a comprehensive MRSA colonization surveillance program. It is not intended to diagnose MRSA infection nor to guide or monitor treatment for MRSA infections.   Blood culture (single)     Status: None   Collection Time: 08/02/2015  3:36 PM  Result Value Ref Range Status   Specimen Description BLOOD RIGHT ASSIST CONTROL  Final   Special Requests BOTTLES DRAWN AEROBIC AND ANAEROBIC  Final   Culture NO GROWTH 5 DAYS  Final   Report Status 08/17/2015 FINAL  Final   Wound culture     Status: None   Collection Time: 08/13/15 12:37 PM  Result Value Ref Range Status   Specimen Description WOUND  Final   Special Requests Normal  Final   Gram Stain   Final    FEW WBC SEEN TOO NUMEROUS TO COUNT GRAM NEGATIVE RODS FEW Romie Minus  POSITIVE COCCI    Culture   Final    HEAVY GROWTH ESCHERICHIA COLI MODERATE GROWTH PROTEUS MIRABILIS LIGHT GROWTH KLEBSIELLA PNEUMONIAE MODERATE GROWTH ENTEROCOCCUS GALLINARUM CRITICAL RESULT CALLED TO, READ BACK BY AND VERIFIED WITH: [facility] BORBA AT 1042 08/16/15 DV    Report Status 08/17/2015 FINAL  Final   Organism ID, Bacteria ESCHERICHIA COLI  Final   Organism ID, Bacteria PROTEUS MIRABILIS  Final   Organism ID, Bacteria KLEBSIELLA PNEUMONIAE  Final   Organism ID, Bacteria ENTEROCOCCUS GALLINARUM  Final      Susceptibility   Escherichia coli - MIC*    AMPICILLIN >=32 RESISTANT Resistant     CEFTAZIDIME 4 RESISTANT Resistant     CEFAZOLIN >=64 RESISTANT Resistant     CEFTRIAXONE 16 RESISTANT Resistant     GENTAMICIN 2 SENSITIVE Sensitive     IMIPENEM >=16 RESISTANT Resistant     TRIMETH/SULFA <=20 SENSITIVE Sensitive     Extended ESBL POSITIVE Resistant     PIP/TAZO Value in next row Resistant      RESISTANT>=128    CIPROFLOXACIN Value in next row Sensitive      SENSITIVE<=0.25    * HEAVY GROWTH ESCHERICHIA COLI   Klebsiella pneumoniae - MIC*    AMPICILLIN Value in next row Resistant      SENSITIVE<=0.25    CEFTAZIDIME Value in next row Resistant      SENSITIVE<=0.25    CEFAZOLIN Value in next row Resistant      SENSITIVE<=0.25    CEFTRIAXONE Value in next row Resistant      SENSITIVE<=0.25    CIPROFLOXACIN Value in next row Resistant      SENSITIVE<=0.25    GENTAMICIN Value in next row Sensitive      SENSITIVE<=0.25    IMIPENEM Value in next row Resistant      SENSITIVE<=0.25    TRIMETH/SULFA Value in next row Resistant      SENSITIVE<=0.25    PIP/TAZO Value in next row Resistant      RESISTANT>=128    *  LIGHT GROWTH KLEBSIELLA PNEUMONIAE   Proteus mirabilis - MIC*    AMPICILLIN Value in next row Resistant      RESISTANT>=128    CEFTAZIDIME Value in next row Sensitive      RESISTANT>=128    CEFAZOLIN Value in next row Sensitive      RESISTANT>=128    CEFTRIAXONE Value in next row Sensitive      RESISTANT>=128    CIPROFLOXACIN Value in next row Sensitive      RESISTANT>=128    GENTAMICIN Value in next row Sensitive      RESISTANT>=128    IMIPENEM Value in next row Sensitive      RESISTANT>=128    TRIMETH/SULFA Value in next row Sensitive      RESISTANT>=128    PIP/TAZO Value in next row Sensitive      SENSITIVE<=4    * MODERATE GROWTH PROTEUS MIRABILIS   Enterococcus gallinarum - MIC*    AMPICILLIN Value in next row Resistant      SENSITIVE<=4    GENTAMICIN SYNERGY Value in next row Sensitive      SENSITIVE<=4    CIPROFLOXACIN Value in next row Resistant      RESISTANT>=8    TETRACYCLINE Value in next row Resistant      RESISTANT>=16    * MODERATE GROWTH ENTEROCOCCUS GALLINARUM  Wound culture     Status: None   Collection Time: 08/15/15  2:53 PM  Result Value Ref Range Status  Specimen Description WOUND  Final   Special Requests NONE  Final   Gram Stain FEW WBC SEEN NO ORGANISMS SEEN   Final   Culture NO GROWTH 3 DAYS  Final   Report Status 08/18/2015 FINAL  Final  C difficile quick scan w PCR reflex     Status: None   Collection Time: 08/17/15 11:34 AM  Result Value Ref Range Status   C Diff antigen NEGATIVE NEGATIVE Final   C Diff toxin NEGATIVE NEGATIVE Final   C Diff interpretation Negative for C. difficile  Final  Stool culture     Status: None   Collection Time: 09/03/15  3:51 PM  Result Value Ref Range Status   Specimen Description STOOL  Final   Special Requests Immunocompromised  Final   Culture   Final    NO SALMONELLA OR SHIGELLA ISOLATED No Pathogenic E. coli detected NO CAMPYLOBACTER DETECTED    Report Status 09/07/2015 FINAL  Final  C difficile  quick scan w PCR reflex     Status: None   Collection Time: 09/13/15 12:51 AM  Result Value Ref Range Status   C Diff antigen NEGATIVE NEGATIVE Final   C Diff toxin NEGATIVE NEGATIVE Final   C Diff interpretation Negative for C. difficile  Final  Culture, blood (routine x 2)     Status: None   Collection Time: 09/16/15 11:24 AM  Result Value Ref Range Status   Specimen Description BLOOD LEFT HAND  Final   Special Requests BOTTLES DRAWN AEROBIC AND ANAEROBIC  1CC  Final   Culture NO GROWTH 5 DAYS  Final   Report Status 09/21/2015 FINAL  Final  Culture, blood (routine x 2)     Status: None   Collection Time: 09/16/15 12:23 PM  Result Value Ref Range Status   Specimen Description BLOOD RIGHT HAND  Final   Special Requests BOTTLES DRAWN AEROBIC AND ANAEROBIC  1CC  Final   Culture  Setup Time   Final    GRAM POSITIVE COCCI AEROBIC BOTTLE ONLY CRITICAL RESULT CALLED TO, READ BACK BY AND VERIFIED WITH: TESS THOMAS,RN 09/17/2015 0631 BY JRS.    Culture   Final    ENTEROCOCCUS FAECALIS AEROBIC BOTTLE ONLY VRE HAVE INTRINSIC RESISTANCE TO MOST COMMONLY USED ANTIBIOTICS AND THE ABILITY TO ACQUIRE RESISTANCE TO MOST AVAILABLE ANTIBIOTICS. CRITICAL RESULT CALLED TO, READ BACK BY AND VERIFIED WITH: CHERYL SMITH AT [id number] 09/19/15 DV    Report Status 09/21/2015 FINAL  Final   Organism ID, Bacteria ENTEROCOCCUS FAECALIS  Final      Susceptibility   Enterococcus faecalis - MIC*    AMPICILLIN <=2 SENSITIVE Sensitive     LINEZOLID 2 SENSITIVE Sensitive     CIPROFLOXACIN Value in next row Resistant      RESISTANT>=8    TETRACYCLINE Value in next row Resistant      RESISTANT>=16    VANCOMYCIN Value in next row Resistant      RESISTANT>=32    GENTAMICIN SYNERGY Value in next row Resistant      RESISTANT>=32    * ENTEROCOCCUS FAECALIS  Culture, respiratory (NON-Expectorated)     Status: None   Collection Time: 09/16/15  3:50 PM  Result Value Ref Range Status   Specimen Description TRACHEAL  ASPIRATE  Final   Special Requests Immunocompromised  Final   Gram Stain   Final    FEW WBC SEEN GOOD SPECIMEN - 80-90% WBCS RARE GRAM NEGATIVE RODS    Culture LIGHT GROWTH PSEUDOMONAS AERUGINOSA  Final  Report Status 09/19/2015 FINAL  Final   Organism ID, Bacteria PSEUDOMONAS AERUGINOSA  Final      Susceptibility   Pseudomonas aeruginosa - MIC*    CEFTAZIDIME 8 SENSITIVE Sensitive     CIPROFLOXACIN 2 INTERMEDIATE Intermediate     GENTAMICIN >=16 RESISTANT Resistant     IMIPENEM >=16 RESISTANT Resistant     PIP/TAZO Value in next row Sensitive      SENSITIVE32    CEFEPIME Value in next row Sensitive      SENSITIVE8    LEVOFLOXACIN Value in next row Resistant      RESISTANT>=8    * LIGHT GROWTH PSEUDOMONAS AERUGINOSA  Culture, expectorated sputum-assessment     Status: None   Collection Time: 09/22/15  2:09 PM  Result Value Ref Range Status   Specimen Description ENDOTRACHEAL  Final   Special Requests Normal  Final   Sputum evaluation THIS SPECIMEN IS ACCEPTABLE FOR SPUTUM CULTURE  Final   Report Status 09/24/2015 FINAL  Final  Culture, respiratory (NON-Expectorated)     Status: None   Collection Time: 09/22/15  2:09 PM  Result Value Ref Range Status   Specimen Description ENDOTRACHEAL  Final   Special Requests Normal Reflexed from [id number]  Final   Gram Stain   Final    FEW WBC SEEN FEW GRAM NEGATIVE RODS POOR SPECIMEN - LESS THAN 70% WBCS    Culture   Final    MODERATE GROWTH PSEUDOMONAS AERUGINOSA LIGHT GROWTH KLEBSIELLA PNEUMONIAE REFER TO SENSITIVITIES FROM PREVIOUS CULTURE FOR ORG 2    Report Status 10/01/2015 FINAL  Final   Organism ID, Bacteria PSEUDOMONAS AERUGINOSA  Final   Organism ID, Bacteria KLEBSIELLA PNEUMONIAE  Final      Susceptibility   Pseudomonas aeruginosa - MIC*    CEFTAZIDIME 4 SENSITIVE Sensitive     CIPROFLOXACIN 2 INTERMEDIATE Intermediate     GENTAMICIN >=16 RESISTANT Resistant     IMIPENEM >=16 RESISTANT Resistant     PIP/TAZO Value in  next row Sensitive      SENSITIVE16    * MODERATE GROWTH PSEUDOMONAS AERUGINOSA  Tissue culture     Status: None   Collection Time: 09/24/15  6:44 AM  Result Value Ref Range Status   Specimen Description BONE  Final   Special Requests Normal  Final   Gram Stain MODERATE WBC SEEN FEW GRAM NEGATIVE RODS   Final   Culture   Final    MODERATE GROWTH ESCHERICHIA COLI MODERATE GROWTH KLEBSIELLA PNEUMONIAE ESBL-EXTENDED SPECTRUM BETA LACTAMASE-THE ORGANISM IS RESISTANT TO PENICILLINS, CEPHALOSPORINS AND AZTREONAM ACCORDING TO CLSI M100-S15 VOL.25 N01 JAN 2005. ORGANISM 1 This organism isolate is resistant to one or more antiotic agents in three or more antimicrobial categories.  Suggest Infectious Disease consult.   ORGANISM 2 CRITICAL RESULT CALLED TO, READ BACK BY AND VERIFIED WITH: RN Mardene Celeste LINDSAY 09/27/15 1005AM    Report Status 09/28/2015 FINAL  Final   Organism ID, Bacteria ESCHERICHIA COLI  Final   Organism ID, Bacteria KLEBSIELLA PNEUMONIAE  Final      Susceptibility   Escherichia coli - MIC*    AMPICILLIN >=32 RESISTANT Resistant     CEFTAZIDIME 4 RESISTANT Resistant     CEFAZOLIN >=64 RESISTANT Resistant     CEFTRIAXONE 8 RESISTANT Resistant     GENTAMICIN <=1 SENSITIVE Sensitive     IMIPENEM 8 RESISTANT Resistant     TRIMETH/SULFA <=20 SENSITIVE Sensitive     Extended ESBL POSITIVE Resistant     PIP/TAZO Value in next row  Resistant      RESISTANT>=128    * MODERATE GROWTH ESCHERICHIA COLI   Klebsiella pneumoniae - MIC*    AMPICILLIN Value in next row Resistant      RESISTANT>=128    CEFTAZIDIME Value in next row Resistant      RESISTANT>=128    CEFAZOLIN Value in next row Resistant      RESISTANT>=128    CEFTRIAXONE Value in next row Resistant      RESISTANT>=128    CIPROFLOXACIN Value in next row Resistant      RESISTANT>=128    GENTAMICIN Value in next row Resistant      RESISTANT>=128    IMIPENEM Value in next row Resistant      RESISTANT>=128    TRIMETH/SULFA  Value in next row Sensitive      RESISTANT>=128    PIP/TAZO Value in next row Resistant      RESISTANT>=128    * MODERATE GROWTH KLEBSIELLA PNEUMONIAE  Anaerobic culture     Status: None   Collection Time: 09/24/15  3:55 PM  Result Value Ref Range Status   Specimen Description BONE  Final   Special Requests Normal  Final   Culture NO ANAEROBES ISOLATED  Final   Report Status 09/29/2015 FINAL  Final  Culture, fungus without smear     Status: None   Collection Time: 09/24/15  3:55 PM  Result Value Ref Range Status   Specimen Description BONE  Final   Special Requests Normal  Final   Culture NO FUNGUS ISOLATED AFTER 24 DAYS  Final   Report Status 10/18/2015 FINAL  Final  C difficile quick scan w PCR reflex     Status: None   Collection Time: 10/07/15  2:02 PM  Result Value Ref Range Status   C Diff antigen NEGATIVE NEGATIVE Final   C Diff toxin NEGATIVE NEGATIVE Final   C Diff interpretation Negative for C. difficile  Final  Culture, blood (routine x 2)     Status: None   Collection Time: 10/16/15  9:52 AM  Result Value Ref Range Status   Specimen Description BLOOD LEFT HAND  Final   Special Requests   Final    BOTTLES DRAWN AEROBIC AND ANAEROBIC  AER 6CC ANA 4CC   Culture NO GROWTH 6 DAYS  Final   Report Status 10/22/2015 FINAL  Final  Culture, blood (routine x 2)     Status: None   Collection Time: 10/16/15 12:25 PM  Result Value Ref Range Status   Specimen Description BLOOD LEFT HAND  Final   Special Requests BOTTLES DRAWN AEROBIC AND ANAEROBIC  5CC  Final   Culture NO GROWTH 6 DAYS  Final   Report Status 10/22/2015 FINAL  Final  Culture, expectorated sputum-assessment     Status: None   Collection Time: 10/18/15  1:15 PM  Result Value Ref Range Status   Specimen Description SPUTUM  Final   Special Requests NONE  Final   Sputum evaluation THIS SPECIMEN IS ACCEPTABLE FOR SPUTUM CULTURE  Final   Report Status 10/18/2015 FINAL  Final  Culture, respiratory  (NON-Expectorated)     Status: None   Collection Time: 10/18/15  1:15 PM  Result Value Ref Range Status   Specimen Description SPUTUM  Final   Special Requests NONE Reflexed from [id number]  Final   Gram Stain   Final    GOOD SPECIMEN - 80-90% WBCS MODERATE WBC SEEN MANY GRAM NEGATIVE RODS    Culture   Final  HEAVY GROWTH PSEUDOMONAS AERUGINOSA MODERATE GROWTH KLEBSIELLA PNEUMONIAE CRITICAL RESULT CALLED TO, READ BACK BY AND VERIFIED WITHRod Mae AT 1610 10/21/15 DV KLEBSIELLA PNEUMONIAE This organism isolate is resistant to one or more antiotic agents in three or more antimicrobial categories.  Suggest Infectious Disease  consult.      Report Status 10/22/2015 FINAL  Final   Organism ID, Bacteria PSEUDOMONAS AERUGINOSA  Final   Organism ID, Bacteria KLEBSIELLA PNEUMONIAE  Final      Susceptibility   Klebsiella pneumoniae - MIC*    AMPICILLIN >=32 RESISTANT Resistant     CEFAZOLIN >=64 RESISTANT Resistant     CEFTRIAXONE >=64 RESISTANT Resistant     CIPROFLOXACIN >=4 RESISTANT Resistant     GENTAMICIN >=16 RESISTANT Resistant     IMIPENEM >=16 RESISTANT Resistant     NITROFURANTOIN >=512 RESISTANT Resistant     TRIMETH/SULFA <=20 SENSITIVE Sensitive     * MODERATE GROWTH KLEBSIELLA PNEUMONIAE   Pseudomonas aeruginosa - MIC*    CEFTAZIDIME 4 SENSITIVE Sensitive     CIPROFLOXACIN >=4 RESISTANT Resistant     GENTAMICIN 8 INTERMEDIATE Intermediate     IMIPENEM >=16 RESISTANT Resistant     PIP/TAZO Value in next row Sensitive      SENSITIVE32    * HEAVY GROWTH PSEUDOMONAS AERUGINOSA  C difficile quick scan w PCR reflex     Status: None   Collection Time: 10/18/15  4:39 PM  Result Value Ref Range Status   C Diff antigen NEGATIVE NEGATIVE Final   C Diff toxin NEGATIVE NEGATIVE Final   C Diff interpretation Negative for C. difficile  Final    Coagulation Studies: No results for input(s): LABPROT, INR in the last 72 hours.  Urinalysis: No results for input(s):  COLORURINE, LABSPEC, PHURINE, GLUCOSEU, HGBUR, BILIRUBINUR, KETONESUR, PROTEINUR, UROBILINOGEN, NITRITE, LEUKOCYTESUR in the last 72 hours.  Invalid input(s): APPERANCEUR    Imaging: No results found.   Medications:   . feeding supplement (JEVITY 1.5 CAL/FIBER) 1,000 mL (10/24/15 0100)  . norepinephrine Stopped (10/08/15 0530)   . albumin human  25 g Intravenous Once  . amantadine  200 mg Per Tube Q7 days  . antiseptic oral rinse  7 mL Mouth Rinse QID  . aspirin  81 mg Oral Daily  . cefTAZidime (FORTAZ)  IV  1 g Intravenous Q24H  . chlorhexidine gluconate  15 mL Mouth Rinse BID  . epoetin (EPOGEN/PROCRIT) injection  10,000 Units Intravenous Q M,W,F-HD  . escitalopram  10 mg Oral Daily  . feeding supplement (PRO-STAT SUGAR FREE 64)  30 mL Oral 6 X Daily  . folic acid  1 mg Oral Daily  . free water  20 mL Per Tube 6 times per day  . heparin subcutaneous  5,000 Units Subcutaneous Q12H  . hydrocortisone  10 mg Per Tube BID  . insulin aspart  0-9 Units Subcutaneous 6 times per day  . ipratropium-albuterol  3 mL Nebulization QID  . lidocaine  1 patch Transdermal Q24H  . midodrine  5 mg Per Tube TID WC  . multivitamin  5 mL Oral Daily  . pantoprazole sodium  40 mg Per Tube Daily  . saccharomyces boulardii  250 mg Oral BID  . sodium chloride  10-40 mL Intracatheter Q12H  . sodium hypochlorite   Irrigation BID  . sulfamethoxazole-trimethoprim  2.5 tablet Per Tube q1800  . thiamine  100 mg Per Tube Daily   sodium chloride, sodium chloride, acetaminophen (TYLENOL) oral liquid 160 mg/5 mL, HYDROmorphone (DILAUDID) injection, loperamide,  morphine injection, ondansetron (ZOFRAN) IV, oxyCODONE, sodium chloride, zinc oxide  Assessment/ Plan:  49 y.o. 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 Admitted on 08/01/2015  1. End-stage renal disease on hemodialysis on HD MWF. The patient has been on dialysis since October of 2014. R IJ permcath. Dialysis treatment with IV albumin for oncotic support. Midodrine and hydrocortisone ordered for blood pressure support.  -  Monitor daily for dialysis need. Dialysis for later today. Continue MWF schedule.   2. Anemia of CKD. Most recent hemoglobin was found to be 7.8 posttransfusion.  - Continue to monitor CBC  - Continue scheduled Epogen for anemia of chronic kidney disease.  3. Secondary hyperparathyroidism: PTH low at 55 (9/25) - Phosphorous 3.5 and acceptable. She remains off Binder therapy.    4. Sepsis: VRE in blood. Bone cultures E. Coli and klebsiella - Treatment as per ID and ICU team: daptomycin completed on 11/10 and now on trimethoprim/sulfamethoxazole. -Pseudomonas growing in the sputum. Continue ceftazidime.  5. Acute resp failure -Pseudomonas noted in the sputum.  -Continue ventilator support, ceftazidime added to medication regimen. - Appreciate pulmonary input.    LOS: 73 Trisha Ken 12/5/20169:20 AM

## 2015-10-24 NOTE — Progress Notes (Signed)
[facility] Medicine Progess Note  Name: Courtney [name] MRN: [id number] DOB: December 01, 1965    ADMISSION DATE:  July 23, 2015   INITIAL PRESENTATION:  49 AAF who has been in medical facilities (hosp, LTAC, rehab) for 2 yrs following gastric bypass surgery with multiple complications. Now with chronic trach, ESRD, profound debilitation, severe sacral pressure ulcer. Was seemingly making progress and transferred to rehab facility approx one week prior to this admission. [facility] with AMS and hypotension. Working dx of severe sepsis/septic shock due to infected sacral pressure ulcer. Since admission. Her course is been very complicated with numerous complications including septic shock and GI bleeding.    INTERVAL HISTORY: The patient is arousable, but does not respond to my questions. She appears to be breathing comfortably on the ventilator. She is awake. On 35% FiO2, PRVC, PEEP 5.  Low hemoglobin noted yesterday, transfused 1 unit of packed red blood cells    Summary of MAJOR EVENTS/TEST RESULTS: Admission 02/07/14-05/07/14 Admission 07/21/14-09/06/14 Discharged to [facility]. Pt had palliative consult at that time, were asked to sign off by husband.  09/23 CT head: NAD 09/23 EEG: no epileptiform activity 09/23 PRBCs for Hgb 6.4 09/24 bedside debridement of sacral wound. Abscess drained 09/25 Off vasopressors. More alert. No distress. Worsening thrombocytopenia. Vanc DC'd 09/29  Dr. [name] (I.D) excused from the case by patient's husband. 10/02 MRI -multiple infarcts 10/03 tracheal bleeding- transfused platelets 10/03 hospitalist service excused from the case by patient's husband 10/03 Echocardiogram ejection fraction was 55-60%, pulmonary systolic pressure was 39 mmHg 40/9810/08 restart TF's at lower rate, attempt reg HD  10/12 Transferred to med-surg floor. Remains on PCCM service 10/14 SLP eval: pt unable to tolerate PMV adequately 10/17-will re-attempt PM  valve-discussed with Speech therapist 10/18 passed swallow eval-start pureed thick foods no thin liquids-continue NG feeds 10/19 transferred to step down for sepsis/aspiration pneumonia 10/19 cxr shows RLL opacity 10/21 sacral decub debride at bedside by surgery 10/22 started back on vasopressors while on HD 10/27 CT with osteo, R hip fx, unable to identify tip of dubhoff tube - sent for fluoro study 10/28 Ortho consultation: I do not feel that she is a surgical candidate. Therefore, I feel that it would best to manage this fracture nonsurgically and allow it to heal by itself over time, which it should.  10/28 Gen Surg consultation: Due to the lack of free air or free flow of contrast and the peritoneum there is no indication for any surgical intervention on this. Would recommend pulling back the feeding tube 1-2 cm. Would recheck an abdominal film to confirm no pneumoperitoneum in the morning. Absence of any changes okay to continue using Dobbhoff for feeding and medications. 10/28 gastrograffin study: The study confirms that the feeding tube pes perforated through the duodenum and the tip is within a cavity that fills with injected contrast. The cavity does appear walled off 11/4 refuses oral feeds, continue TF's CT reviewed: T8-T9 discitis/osteomyelitis, RT HIP FRACTURE 11/5 placed back on vasopressors, placed back on Vent due to resp acidosis, levophed turned off 11/6 afternoon 11/5 PM Trach changed out due to cuff leak, #6 Shiley cuffed, 11/6 dubhoff occluded, removed and replaced, new dubhoff shows tube in the antral stomach 11/15 refusing to take oral feeds 11/18 placed back on Vent for increased WOB,SOB. 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 10/18/2015; the patient developed a new left basal pneumonia;  with recurrent sepsis.  10/19/2015; fevers resolved with IV acetaminophen. Tube feeds changed from vital  to Jevity.    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 Pneumoniae, P. Mirabilis, VRE Wound 9/26 >> No growth Blood 9/23 >> NEG CDiff 9/27>>neg Stool Cx 10/15>> negative Cdiff 10/25>>neg Trach Aspirate 10/28>> light growth pseudomonas Blood 10/28 >> 1/2 GPC >>  Sputum cultures obtained 09/22/15 due to mucus plugging>>Pseudomonas BONE TISSUE Cx 11/3>>E.coli (ESBL), k. Pneumonia (daptomycin and septra) Bld Cx 11/27>>negative thus far.  Trach Asp 11/27>> grew out Pseudomonas, and Klebsiella pneumonia.  ANTIMICROBIALS:  Aztreonam 9/23 >> 9/24 Vanc 9/23 >> 9/25 Vanc 9/26>>9/27 Daptomycin 9/27>> 10/12, 10/28>> off Meropenem 9/23 >> 10/14, 10/28 >> 10/31 Septra 11/8>> Zosyn 11/27,>> 11/30. Levaquin 11/29>> 11/29. Ceftaz 12/1>>  VITAL SIGNS: Temp:  [97.9 F (36.6 C)-99.6 F (37.6 C)] 98.5 F (36.9 C) (12/05 0800) Pulse Rate:  [88-109] 93 (12/05 1100) Resp:  [0-31] 27 (12/05 1100) BP: (116-147)/(49-87) 120/63 mmHg (12/05 1100) SpO2:  [92 %-100 %] 100 % (12/05 1100) FiO2 (%):  [35 %] 35 % (12/05 1225) HEMODYNAMICS:   VENTILATOR SETTINGS: Vent Mode:  [-] Spontaneous FiO2 (%):  [35 %] 35 % Set Rate:  [16 bmp] 16 bmp Vt Set:  [500 mL] 500 mL PEEP:  [5 cmH20] 5 cmH20 Pressure Support:  [25 cmH20] 25 cmH20 INTAKE / OUTPUT:  Intake/Output Summary (Last 24 hours) at 10/24/15 1302 Last data filed at 10/24/15 0700  Gross per 24 hour  Intake    960 ml  Output      0 ml  Net    960 ml   PHYSICAL EXAMINATION: Physical Examination:   VS: BP 120/63 mmHg  Pulse 93  Temp(Src) 98.5 F (36.9 C) (Oral)  Resp 27  Ht   (1.753 m)  Wt 97 kg (213 lb 13.5 oz)  BMI 31.57 kg/m2  SpO2 100%  LMP  (LMP Unknown)  General Appearance: No resp distress, coarse upper airway sounds, on vent Neuro: profoundly diffusely weak, alert today, nodding head HEENT: cushingoid facies, PERRLA, EOM intact Neck: trach site clean Pulmonary: clear anteriorly Cardiovascular: reg, + syst murmur Abdomen: soft, NT, +BS Extremities: warm, R>L UE edema    LABORATORY PANEL:   CBC  Recent Labs Lab 10/22/15 0457  WBC 11.9*  HGB 7.8*  HCT 25.2*  PLT 170    Chemistries   Recent Labs Lab 10/21/15 1720  NA 138  K 4.8  CL 97*  CO2 33*  GLUCOSE 139*  BUN 69*  CREATININE 1.06*  CALCIUM 8.3*  PHOS 3.5  3.6       IMPRESSION/PLAN (current major issues):  Recurrent sepsis, due to pneumonia.   Anemia with macrocytosis. -Uncertain if this is due to chronic slow GI bleed or simple critical care associated anemia. She has already receiving folate, thiamine, and multivitamin. Her anemia has not responded to oral medications, this may be due to her GI malabsorption.  Pneumonia with sepsis-better. -Chest x-ray shows new left lower lobe infiltrate. Sputum cultures show pseudomonas. The patient is once again, vent dependent. She has developed again recurrent sepsis.Marland Kitchen Sputum cultures showing Pseudomonas, and Klebsiella, currently covered with this. Ceftaz. Weaning trial today.  Worsening sacral decubitus ulcer, stage IV. -The patient is already had a sacral bone scraping, and has been placed on Septra for antibiotic, per ID recommendations. Her sacral decubitus does not appear to be improving and in fact appears to be worsening. In addition, she has several new smaller areas of tissue breakdown.  End-stage renal disease. -Continue on hemodialysis per nephrology.  Diarrhea. -This has improved since changing her loperamide 2 twice daily scheduled, and changing her tube feeds back to Jevity. Continue loperamide to when  necessary.  Patient appears to be stable for transfer to an LTAC facility at this point in time.   Additional Chronic issues and complicating factors during this admission.  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  Thrombocytopenia, resolved - HIT panel negative 9/25 Recurrent Severe sepsis, due to sacral pressure ulcer DM 2, controlled.  Chronic steroid therapy, for a history of of adrenal insufficiency Incidental finding of R hip fx - conservative mgmt. Waxing and waning encephalopathy - she was evaluated by both the psychiatry and neurology services, and it was deemed that the patient lacks decisional capacity at this time. 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 Husband declined TEE Profound deconditioning-criticall illness neuropathy Chronic pain   Husband updated regarding patients status.    Wells Guiles, M.D.  [facility]   Critical Care Attestation.  I have personally obtained a history, examined the patient, evaluated laboratory and imaging results, formulated the assessment and plan and placed orders. The case was discussed with the critical care RN, nutrition, respiratory therapist, Associate Professor. The Patient requires high complexity decision making for assessment and support, frequent evaluation and titration of therapies, application of advanced monitoring technologies and extensive interpretation of multiple databases. The patient has critical illness that could lead imminently to failure of 1 or more organ systems and requires the highest level of physician preparedness to intervene.  Critical Care Time devoted to patient care services described in this note is 35 minutes and is exclusive of time spent in procedures.

## 2015-10-25 ENCOUNTER — Encounter: Payer: Self-pay | Admitting: Internal Medicine

## 2015-10-25 LAB — CBC
HCT: 24.2 % — ABNORMAL LOW (ref 35.0–47.0)
Hemoglobin: 7.6 g/dL — ABNORMAL LOW (ref 12.0–16.0)
MCH: 29.9 pg (ref 26.0–34.0)
MCHC: 31.2 g/dL — AB (ref 32.0–36.0)
MCV: 95.8 fL (ref 80.0–100.0)
PLATELETS: 195 10*3/uL (ref 150–440)
RBC: 2.53 MIL/uL — ABNORMAL LOW (ref 3.80–5.20)
RDW: 16.6 % — AB (ref 11.5–14.5)
WBC: 8 10*3/uL (ref 3.6–11.0)

## 2015-10-25 LAB — BASIC METABOLIC PANEL
ANION GAP: 7 (ref 5–15)
BUN: 60 mg/dL — AB (ref 6–20)
CALCIUM: 8.2 mg/dL — AB (ref 8.9–10.3)
CO2: 33 mmol/L — ABNORMAL HIGH (ref 22–32)
CREATININE: 1.04 mg/dL — AB (ref 0.44–1.00)
Chloride: 96 mmol/L — ABNORMAL LOW (ref 101–111)
GFR calc Af Amer: >60 mL/min (ref >60)
GLUCOSE: 126 mg/dL — AB (ref 65–99)
Potassium: 4.8 mmol/L (ref 3.5–5.1)
Sodium: 136 mmol/L (ref 135–145)

## 2015-10-25 LAB — GLUCOSE, CAPILLARY
GLUCOSE-CAPILLARY: 107 mg/dL — AB (ref 65–99)
GLUCOSE-CAPILLARY: 149 mg/dL — AB (ref 65–99)
Glucose-Capillary: 158 mg/dL — ABNORMAL HIGH (ref 65–99)
Glucose-Capillary: 114 mg/dL — ABNORMAL HIGH (ref 65–99)
Glucose-Capillary: 124 mg/dL — ABNORMAL HIGH (ref 65–99)
Glucose-Capillary: 142 mg/dL — ABNORMAL HIGH (ref 65–99)

## 2015-10-25 NOTE — Progress Notes (Signed)
MEDICATION RELATED CONSULT NOTE - Follow up   Pharmacy Consult for Renal dosing  Allergies  Allergen Reactions  . Contrast Media [Iodinated Diagnostic Agents] Anaphylaxis  . Ampicillin Rash    Patient Measurements: Height:  (SCALE BROKE) Weight:  (bed scale broken) IBW/kg (Calculated) : 66.2  Vital Signs: Temp: 98.4 F (36.9 C) (12/06 1400) Temp Source: Oral (12/06 1400) BP: 116/59 mmHg (12/06 1400) Pulse Rate: 103 (12/06 1400)  Recent Labs  10/24/15 2026 10/24/15 2119 10/25/15 0500  WBC 7.4  --  8.0  HGB 7.6*  --  7.6*  HCT 24.9*  --  24.2*  PLT 183  --  195  CREATININE  --  1.10* 1.04*  PHOS  --  3.9  --   ALBUMIN  --  1.9*  --    Estimated Creatinine Clearance: 80.2 mL/min (by C-G formula based on Cr of 1.04).   Assessment: 49 yo patient on HD. Pharmacy consulted for renal dosing of medications.   Plan:  No medications require adjustment at present. Pharmacy will continue to monitor patients medications and dose adjust as needed.    Luisa HartScott Tammie Ellsworth, PharmD  10/25/2015

## 2015-10-25 NOTE — Progress Notes (Signed)
[facility] Critical Care Medicine Progess Note  Name: Courtney [name] MRN: [id number] DOB: [birth year]    ADMISSION DATE:  07/24/2015   INITIAL PRESENTATION:  49 AAF who has been in medical facilities (hosp, LTAC, rehab) for 2 yrs following gastric bypass surgery with multiple complications. Now with chronic trach, ESRD, profound debilitation, severe sacral pressure ulcer. Was seemingly making progress and transferred to rehab facility approx one week prior to this admission. [facility] with AMS and hypotension. Working dx of severe sepsis/septic shock due to infected sacral pressure ulcer. Since admission. Her course is been very complicated with numerous complications including septic shock and GI bleeding.    INTERVAL HISTORY: The patient is arousable, tolerated a few hours of PSV yesterday along with removal 0.5L on HD. CXR in the AM.   Summary of MAJOR EVENTS/TEST RESULTS: Admission 02/07/14-05/07/14 Admission 07/21/14-09/06/14 Discharged to [facility]. Pt had palliative consult at that time, were asked to sign off by husband.  09/23 CT head: NAD 09/23 EEG: no epileptiform activity 09/23 PRBCs for Hgb 6.4 09/24 bedside debridement of sacral wound. Abscess drained 09/25 Off vasopressors. More alert. No distress. Worsening thrombocytopenia. Vanc DC'd 09/29  Dr. [name] (I.D) excused from the case by patient's husband. 10/02 MRI -multiple infarcts 10/03 tracheal bleeding- transfused platelets 10/03 hospitalist service excused from the case by patient's husband 10/03 Echocardiogram ejection fraction was 55-60%, pulmonary systolic pressure was 39 mmHg 06/6509/08 restart TF's at lower rate, attempt reg HD  10/12 Transferred to med-surg floor. Remains on PCCM service 10/14 SLP eval: pt unable to tolerate PMV adequately 10/17-will re-attempt PM valve-discussed with Speech therapist 10/18 passed swallow eval-start pureed thick foods no thin liquids-continue NG feeds 10/19  transferred to step down for sepsis/aspiration pneumonia 10/19 cxr shows RLL opacity 10/21 sacral decub debride at bedside by surgery 10/22 started back on vasopressors while on HD 10/27 CT with osteo, R hip fx, unable to identify tip of dubhoff tube - sent for fluoro study 10/28 Ortho consultation: I do not feel that she is a surgical candidate. Therefore, I feel that it would best to manage this fracture nonsurgically and allow it to heal by itself over time, which it should.  10/28 Gen Surg consultation: Due to the lack of free air or free flow of contrast and the peritoneum there is no indication for any surgical intervention on this. Would recommend pulling back the feeding tube 1-2 cm. Would recheck an abdominal film to confirm no pneumoperitoneum in the morning. Absence of any changes okay to continue using Dobbhoff for feeding and medications. 10/28 gastrograffin study: The study confirms that the feeding tube pes perforated through the duodenum and the tip is within a cavity that fills with injected contrast. The cavity does appear walled off 11/4 refuses oral feeds, continue TF's CT reviewed: T8-T9 discitis/osteomyelitis, RT HIP FRACTURE 11/5 placed back on vasopressors, placed back on Vent due to resp acidosis, levophed turned off 11/6 afternoon 11/5 PM Trach changed out due to cuff leak, #6 Shiley cuffed, 11/6 dubhoff occluded, removed and replaced, new dubhoff shows tube in the antral stomach 11/15 refusing to take oral feeds 11/18 placed back on Vent for increased WOB,SOB. 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 10/18/2015; the patient developed a new left basal pneumonia; with recurrent sepsis.  10/19/2015; fevers resolved with IV acetaminophen. Tube feeds changed from vital to Jevity.    INDWELLING DEVICES::  Trach (chronic) placed June 2014 Tunneled R IJ HD cath (chronic) Tunneled L IJ CVL  (chronic) L femoral A-line 9/23 >> 9/25  MICRO DATA: 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 Pneumoniae, P. Mirabilis, VRE Wound 9/26 >> No growth Blood 9/23 >> NEG CDiff 9/27>>neg Stool Cx 10/15>> negative Cdiff 10/25>>neg Trach Aspirate 10/28>> light growth pseudomonas Blood 10/28 >> 1/2 GPC >>  Sputum cultures obtained 09/22/15 due to mucus plugging>>Pseudomonas BONE TISSUE Cx 11/3>>E.coli (ESBL), k. Pneumonia (daptomycin and septra) Bld Cx 11/27>>negative thus far.  Trach Asp 11/27>> grew out Pseudomonas, and Klebsiella pneumonia.  ANTIMICROBIALS:  Aztreonam 9/23 >> 9/24 Vanc 9/23 >> 9/25 Vanc 9/26>>9/27 Daptomycin 9/27>> 10/12, 10/28>> off Meropenem 9/23 >> 10/14, 10/28 >> 10/31 Septra 11/8>> Zosyn 11/27,>> 11/30. Levaquin 11/29>> 11/29. Ceftaz 12/1>>  VITAL SIGNS: Temp:  [97.8 F (36.6 C)-98.3 F (36.8 C)] 98.2 F (36.8 C) (12/06 0800) Pulse Rate:  [93-99] 93 (12/06 0800) Resp:  [0-30] 22 (12/06 0800) BP: (100-136)/(38-108) 119/75 mmHg (12/06 0800) SpO2:  [99 %-100 %] 100 % (12/06 0800) FiO2 (%):  [35 %] 35 % (12/06 1150) HEMODYNAMICS:   VENTILATOR SETTINGS: Vent Mode:  [-] Spontaneous FiO2 (%):  [35 %] 35 % Set Rate:  [16 bmp] 16 bmp Vt Set:  [500 mL] 500 mL PEEP:  [5 cmH20] 5 cmH20 Pressure Support:  [15 cmH20-25 cmH20] 15 cmH20 Plateau Pressure:  [20 cmH20] 20 cmH20 INTAKE / OUTPUT:  Intake/Output Summary (Last 24 hours) at 10/25/15 1201 Last data filed at 10/25/15 1056  Gross per 24 hour  Intake   1335 ml  Output    600 ml  Net    735 ml   PHYSICAL EXAMINATION: Physical Examination:   VS: BP 119/75 mmHg  Pulse 93  Temp(Src) 98.2 F (36.8 C) (Oral)  Resp 22  Ht  (1.753 m)  Wt 213 lb 13.5 oz (97 kg)  BMI 31.57 kg/m2  SpO2 100%  LMP   (LMP Unknown)  General Appearance: No resp distress, coarse upper airway sounds, on vent Neuro: profoundly diffusely weak, alert today, sleeping but easily arousable HEENT: cushingoid facies, PERRLA, EOM intact Neck: trach site clean Pulmonary: clear anteriorly Cardiovascular: reg, + syst murmur Abdomen: soft, NT, +BS Extremities: warm, R>L UE edema    LABORATORY PANEL:   CBC  Recent Labs Lab 10/25/15 0500  WBC 8.0  HGB 7.6*  HCT 24.2*  PLT 195    Chemistries   Recent Labs Lab 10/24/15 2119 10/25/15 0500  NA 140 136  K 4.8 4.8  CL 99* 96*  CO2 35* 33*  GLUCOSE 132* 126*  BUN 71* 60*  CREATININE 1.10* 1.04*  CALCIUM 8.3* 8.2*  PHOS 3.9  --        IMPRESSION/PLAN (current major issues):  Recurrent sepsis, due to pneumonia.   Anemia with macrocytosis. -Uncertain if this is due to chronic slow GI bleed or simple critical care associated anemia. She has already receiving folate, thiamine, and multivitamin. Her anemia has not responded to oral medications, this may be due to her GI malabsorption.  Pneumonia with sepsis-better. -Chest x-ray shows new left lower lobe infiltrate. Sputum cultures show pseudomonas. The patient is once again, vent dependent. She has developed again recurrent sepsis.Marland Kitchen Sputum cultures showing Pseudomonas, and Klebsiella, currently covered with this. Ceftaz. Duration to be determined by ID Weaning trial  daily off MV  Worsening sacral decubitus ulcer, stage IV. -The patient is already had a sacral bone scraping, and has been placed on Septra for antibiotic, per ID recommendations. Her sacral decubitus does not appear to be improving and in fact appears to be worsening. In addition, she has several new smaller areas of tissue breakdown.  End-stage renal disease. -Continue on hemodialysis per nephrology.  Diarrhea. -This has improved since changing her loperamide 2 twice daily scheduled, and changing her tube feeds back to Jevity.  Continue loperamide to when necessary.  Patient appears to be stable for transfer to an LTAC facility at this point in time.   Additional Chronic issues and complicating factors during this admission.  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  Thrombocytopenia, resolved - HIT panel negative 9/25 Recurrent Severe sepsis, due to sacral pressure ulcer DM 2, controlled.  Chronic steroid therapy, for a history of of adrenal insufficiency Incidental finding of R hip fx - conservative mgmt. Waxing and waning encephalopathy - she was evaluated by both the psychiatry and neurology services, and it was deemed that the patient lacks decisional capacity at this time. 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 Husband declined TEE Profound deconditioning-criticall illness neuropathy Chronic pain   Husband updated regarding patients status.    Stephanie Acre, MD [facility] Pager [phone] (please enter 7-digits) On Call Pager - [phone] (please enter 7-digits)    Critical Care Attestation.  I have personally obtained a history, examined the patient, evaluated laboratory and imaging results, formulated the assessment and plan and placed orders. The case was discussed with the critical care RN, nutrition, respiratory therapist, Associate Professor. The Patient requires high complexity decision making for assessment and support, frequent evaluation and titration of therapies, application of advanced monitoring technologies and extensive interpretation of multiple databases. The patient has critical illness that could lead imminently to failure of 1 or more organ systems and requires the highest level of physician preparedness to intervene.  Critical Care Time devoted to patient care services described in this note is 35 minutes and is exclusive of time spent in procedures.

## 2015-10-25 NOTE — Progress Notes (Signed)
PT Cancellation Note  Patient Details Name: Lisette GrinderHedda McMillian Monroe MRN: [id number] DOB: 04/19/1966   Cancelled Treatment:    Reason Eval/Treat Not Completed: Patient declined, no reason specified (Treatment session attempted.  Patient repeatedly pulling L UE away from therapist, consistently shaking head "no" to all attempts at movement, all attempts at interaction.  Will re-attempt at later time/date as appropriate.)   Izza Bickle H. Manson PasseyBrown, PT, DPT, NCS 10/25/2015, 9:59 AM [phone]

## 2015-10-25 NOTE — Progress Notes (Signed)
ANTIBIOTIC CONSULT NOTE - Follow up  Pharmacy Consult for Ceftazidime  Indication: pneumonia  Allergies  Allergen Reactions  . Contrast Media [Iodinated Diagnostic Agents] Anaphylaxis  . Ampicillin Rash   Patient Measurements: Height:  (SCALE BROKE) Weight:  (bed scale broken) IBW/kg (Calculated) : 66.2  Vital Signs: Temp: 98.4 F (36.9 C) (12/06 1400) Temp Source: Oral (12/06 1400) BP: 116/59 mmHg (12/06 1400) Pulse Rate: 103 (12/06 1400) Intake/Output from previous day: 12/05 0701 - 12/06 0700 In: 1150 [NG/GT:950; IV Piggyback:200] Out: 600    Recent Labs  10/24/15 2026 10/24/15 2119 10/25/15 0500  WBC 7.4  --  8.0  HGB 7.6*  --  7.6*  PLT 183  --  195  CREATININE  --  1.10* 1.04*   Estimated Creatinine Clearance: 80.2 mL/min (by C-G formula based on Cr of 1.04).   Microbiology: Recent Results (from the past 720 hour(s))  C difficile quick scan w PCR reflex     Status: None   Collection Time: 10/07/15  2:02 PM  Result Value Ref Range Status   C Diff antigen NEGATIVE NEGATIVE Final   C Diff toxin NEGATIVE NEGATIVE Final   C Diff interpretation Negative for C. difficile  Final  Culture, blood (routine x 2)     Status: None   Collection Time: 10/16/15  9:52 AM  Result Value Ref Range Status   Specimen Description BLOOD LEFT HAND  Final   Special Requests   Final    BOTTLES DRAWN AEROBIC AND ANAEROBIC  AER 6CC ANA 4CC   Culture NO GROWTH 6 DAYS  Final   Report Status 10/22/2015 FINAL  Final  Culture, blood (routine x 2)     Status: None   Collection Time: 10/16/15 12:25 PM  Result Value Ref Range Status   Specimen Description BLOOD LEFT HAND  Final   Special Requests BOTTLES DRAWN AEROBIC AND ANAEROBIC  5CC  Final   Culture NO GROWTH 6 DAYS  Final   Report Status 10/22/2015 FINAL  Final  Culture, expectorated sputum-assessment     Status: None   Collection Time: 10/18/15  1:15 PM  Result Value Ref Range Status   Specimen Description SPUTUM  Final   Special Requests NONE  Final   Sputum evaluation THIS SPECIMEN IS ACCEPTABLE FOR SPUTUM CULTURE  Final   Report Status 10/18/2015 FINAL  Final  Culture, respiratory (NON-Expectorated)     Status: None   Collection Time: 10/18/15  1:15 PM  Result Value Ref Range Status   Specimen Description SPUTUM  Final   Special Requests NONE Reflexed from [id number]  Final   Gram Stain   Final    GOOD SPECIMEN - 80-90% WBCS MODERATE WBC SEEN MANY GRAM NEGATIVE RODS    Culture   Final    HEAVY GROWTH PSEUDOMONAS AERUGINOSA MODERATE GROWTH KLEBSIELLA PNEUMONIAE CRITICAL RESULT CALLED TO, READ BACK BY AND VERIFIED WITHRod Mae AT 1610 10/21/15 DV KLEBSIELLA PNEUMONIAE This organism isolate is resistant to one or more antiotic agents in three or more antimicrobial categories.  Suggest Infectious Disease  consult.      Report Status 10/22/2015 FINAL  Final   Organism ID, Bacteria PSEUDOMONAS AERUGINOSA  Final   Organism ID, Bacteria KLEBSIELLA PNEUMONIAE  Final      Susceptibility   Klebsiella pneumoniae - MIC*    AMPICILLIN >=32 RESISTANT Resistant     CEFAZOLIN >=64 RESISTANT Resistant     CEFTRIAXONE >=64 RESISTANT Resistant     CIPROFLOXACIN >=4 RESISTANT Resistant  GENTAMICIN >=16 RESISTANT Resistant     IMIPENEM >=16 RESISTANT Resistant     NITROFURANTOIN >=512 RESISTANT Resistant     TRIMETH/SULFA <=20 SENSITIVE Sensitive     * MODERATE GROWTH KLEBSIELLA PNEUMONIAE   Pseudomonas aeruginosa - MIC*    CEFTAZIDIME 4 SENSITIVE Sensitive     CIPROFLOXACIN >=4 RESISTANT Resistant     GENTAMICIN 8 INTERMEDIATE Intermediate     IMIPENEM >=16 RESISTANT Resistant     PIP/TAZO Value in next row Sensitive      SENSITIVE32    * HEAVY GROWTH PSEUDOMONAS AERUGINOSA  C difficile quick scan w PCR reflex     Status: None   Collection Time: 10/18/15  4:39 PM  Result Value Ref Range Status   C Diff antigen NEGATIVE NEGATIVE Final   C Diff toxin NEGATIVE NEGATIVE Final   C Diff interpretation  Negative for C. difficile  Final   Assessment: 49 yo female with ESRD receiving HD every MWF. Pharmacy consulted for dosing and monitoring of ceftazidime.   Patients sputum cultures showing heavy growth of Pseudomonas and Klebsiella.  Suspectibilites show Pseudomonas is sensitive to Ceftazidime and Klebsiella is sensitive to Septra which is currently on board as well.   Plan:  Will continue patient on Ceftazidime 1g every 24 hours for HD dosing.  Pharmacy will continue to monitor labs.  Luisa HartScott Kunal Levario, PharmD   10/25/2015 3:27 PM

## 2015-10-25 NOTE — Care Management (Signed)
Dr Dema SeverinMungal has provided two letters requested by patient's husband- one for disability and one regarding being called for federal jury case in January.  Did not receive any offers from referrals that were faxed out.  Will identify [place] in surrounding [place]  [place] and [place].  No plans for PEG tube insertion

## 2015-10-25 NOTE — Progress Notes (Signed)
Pt is grimacing when touched. When asked is she is in pain,  Pt nods. Asked if she needs pain medicine pt nods again. Pain meds given will continue to assess.

## 2015-10-25 NOTE — Progress Notes (Signed)
  [facility]        To whom it may concern:   [name], [place] 11/07/1966, is not medically fit to perform jury duty.  Patient is bed bound, critically ill, and mechanically ventilated.   Thank you,    ______________________________ Stephanie AcreVishal Doss Cybulski, MD [facility])

## 2015-10-25 NOTE — Progress Notes (Signed)
Pt is resting and denying pain or discomfort.   Goal: Tolerating diet Outcome: Progressing, Pt is tolerating Jevity 1.5 at 50 ml/hr. Had no stools on my shift.  Goal: Pain controlled on oral analgesia Outcome:  Progressing Dilaudid for dressing change, and oxycodone x1 to control pain  Goal: Other Phase II Outcomes/Goals Outcome: Progressing Wound care to sacrum done x 2 with wet to dry Dakins today. Wound with 90% granulation and 10% yellow slough. Edges are rolled and tunneling noted.

## 2015-10-25 NOTE — Progress Notes (Signed)
  [facility]        To whom it may concern:   Mrs. Courtney GrinderHedda McMillian [place], [place] [birth year], is not medically fit to manage her disability application or attend any disability appointments.  Patient is bed bound, critically ill, and mechanically ventilated.   Thank you,    ______________________________ Stephanie AcreVishal Montez Stryker, MD [facility])

## 2015-10-25 NOTE — Progress Notes (Signed)
Subjective:   Hemodialysis yesterday. UF of Hemodynamically stable.  Continues to have peripheral edema   Objective:  Vital signs in last 24 hours:  Temp:  [97.7 F (36.5 C)-98.3 F (36.8 C)] 98.2 F (36.8 C) (12/06 0800) Pulse Rate:  [93-102] 93 (12/06 0800) Resp:  [0-30] 22 (12/06 0800) BP: (100-136)/(38-108) 119/75 mmHg (12/06 0800) SpO2:  [99 %-100 %] 100 % (12/06 0800) FiO2 (%):  [35 %] 35 % (12/06 0856)  Weight change:  Filed Weights    Intake/Output: I/O last 3 completed shifts: In: 1810 [NG/GT:1610; IV Piggyback:200] Out: 600 [Other:600]   Intake/Output this shift:  Total I/O In: 165 [NG/GT:165] Out: -   Physical Exam: General: critically ill appearing  Head: NG in place, OM moist  Eyes: anicteric  Neck: Tracheostomy in place  Lungs:  Scattered rhonchi, vent assisted fio2 35%,   Heart: S1S2 no rubs  Abdomen:  Soft, nontender  Extremities: 1+ dependent edema, anasarca  Neurologic: Awake, not consistently following commands  Access:  R IJ permcath.       Basic Metabolic Panel:  Recent Labs Lab 10/19/15 0515 10/21/15 1720 10/24/15 2119 10/25/15 0500  NA 142 138 140 136  K 4.6 4.8 4.8 4.8  CL 100* 97* 99* 96*  CO2 33* 33* 35* 33*  GLUCOSE 183* 139* 132* 126*  BUN 76* 69* 71* 60*  CREATININE 1.27* 1.06* 1.10* 1.04*  CALCIUM 8.5* 8.3* 8.3* 8.2*  PHOS  --  3.5  3.6 3.9  --     Liver Function Tests:  Recent Labs Lab 10/21/15 1720 10/24/15 2119  ALBUMIN 2.0* 1.9*   No results for input(s): LIPASE, AMYLASE in the last 168 hours. No results for input(s): AMMONIA in the last 168 hours.  CBC:  Recent Labs Lab 10/19/15 0515 10/21/15 1720 10/21/15 2301 10/22/15 0457 10/24/15 2026 10/25/15 0500  WBC 12.0* 11.4*  --  11.9* 7.4 8.0  HGB 7.4* 6.7* 8.2* 7.8* 7.6* 7.6*  HCT 24.0* 22.2* 26.1* 25.2* 24.9* 24.2*  MCV 96.6 96.7  --  95.3 97.3 95.8  PLT 145* 172  --  170 183 195    Cardiac Enzymes: No results for input(s): CKTOTAL,  CKMB, CKMBINDEX, TROPONINI in the last 168 hours.  BNP: Invalid input(s): POCBNP  CBG:  Recent Labs Lab 10/24/15 1142 10/24/15 1621 10/24/15 2002 10/25/15 0458 10/25/15 0736  GLUCAP 137* 143* 158* 114* 107*    Microbiology: Results for orders placed or performed during the hospital encounter of 07/22/2015  Blood Culture (routine x 2)     Status: None   Collection Time: 07/31/2015  8:51 AM  Result Value Ref Range Status   Specimen Description BLOOD [name]  Final   Special Requests BOTTLES DRAWN AEROBIC AND ANAEROBIC  3CC  Final   Culture NO GROWTH 5 DAYS  Final   Report Status 08/17/2015 FINAL  Final  Blood Culture (routine x 2)     Status: None   Collection Time: 08/01/2015  9:20 AM  Result Value Ref Range Status   Specimen Description BLOOD LEFT ARM  Final   Special Requests BOTTLES DRAWN AEROBIC AND ANAEROBIC  1CC  Final   Culture NO GROWTH 5 DAYS  Final   Report Status 08/17/2015 FINAL  Final  Wound culture     Status: None   Collection Time: 08/02/2015  9:20 AM  Result Value Ref Range Status   Specimen Description DECUBITIS  Final   Special Requests Normal  Final   Gram Stain   Final  FEW WBC SEEN MANY GRAM NEGATIVE RODS RARE GRAM POSITIVE COCCI    Culture   Final    HEAVY GROWTH ESCHERICHIA COLI MODERATE GROWTH ENTEROBACTER AEROGENES PROTEUS MIRABILIS HEAVY GROWTH ENTEROCOCCUS SPECIES VRE HAVE INTRINSIC RESISTANCE TO MOST COMMONLY USED ANTIBIOTICS AND THE ABILITY TO ACQUIRE RESISTANCE TO MOST AVAILABLE ANTIBIOTICS.    Report Status 08/16/2015 FINAL  Final   Organism ID, Bacteria ESCHERICHIA COLI  Final   Organism ID, Bacteria ENTEROBACTER AEROGENES  Final   Organism ID, Bacteria PROTEUS MIRABILIS  Final   Organism ID, Bacteria ENTEROCOCCUS SPECIES  Final      Susceptibility   Enterobacter aerogenes - MIC*    CEFTAZIDIME <=1 SENSITIVE Sensitive     CEFAZOLIN >=64 RESISTANT Resistant     CEFTRIAXONE <=1 SENSITIVE Sensitive     CIPROFLOXACIN <=0.25 SENSITIVE  Sensitive     GENTAMICIN <=1 SENSITIVE Sensitive     IMIPENEM 1 SENSITIVE Sensitive     TRIMETH/SULFA <=20 SENSITIVE Sensitive     * MODERATE GROWTH ENTEROBACTER AEROGENES   Escherichia coli - MIC*    AMPICILLIN <=2 SENSITIVE Sensitive     CEFTAZIDIME <=1 SENSITIVE Sensitive     CEFAZOLIN <=4 SENSITIVE Sensitive     CEFTRIAXONE <=1 SENSITIVE Sensitive     CIPROFLOXACIN <=0.25 SENSITIVE Sensitive     GENTAMICIN <=1 SENSITIVE Sensitive     IMIPENEM <=0.25 SENSITIVE Sensitive     TRIMETH/SULFA <=20 SENSITIVE Sensitive     * HEAVY GROWTH ESCHERICHIA COLI   Proteus mirabilis - MIC*    AMPICILLIN >=32 RESISTANT Resistant     CEFTAZIDIME <=1 SENSITIVE Sensitive     CEFAZOLIN 8 SENSITIVE Sensitive     CEFTRIAXONE <=1 SENSITIVE Sensitive     CIPROFLOXACIN <=0.25 SENSITIVE Sensitive     GENTAMICIN <=1 SENSITIVE Sensitive     IMIPENEM 1 SENSITIVE Sensitive     TRIMETH/SULFA <=20 SENSITIVE Sensitive     * PROTEUS MIRABILIS   Enterococcus species - MIC*    AMPICILLIN >=32 RESISTANT Resistant     VANCOMYCIN >=32 RESISTANT Resistant     GENTAMICIN SYNERGY SENSITIVE Sensitive     TETRACYCLINE Value in next row Resistant      RESISTANT>=16    * HEAVY GROWTH ENTEROCOCCUS SPECIES  MRSA PCR Screening     Status: None   Collection Time: 07/28/2015  2:38 PM  Result Value Ref Range Status   MRSA by PCR NEGATIVE NEGATIVE Final    Comment:        The GeneXpert MRSA Assay (FDA approved for NASAL specimens only), is one component of a comprehensive MRSA colonization surveillance program. It is not intended to diagnose MRSA infection nor to guide or monitor treatment for MRSA infections.   Blood culture (single)     Status: None   Collection Time: 08/07/2015  3:36 PM  Result Value Ref Range Status   Specimen Description BLOOD RIGHT ASSIST CONTROL  Final   Special Requests BOTTLES DRAWN AEROBIC AND ANAEROBIC 5ML  Final   Culture NO GROWTH 5 DAYS  Final   Report Status 08/17/2015 FINAL  Final   Wound culture     Status: None   Collection Time: 08/13/15 12:37 PM  Result Value Ref Range Status   Specimen Description WOUND  Final   Special Requests Normal  Final   Gram Stain   Final    FEW WBC SEEN TOO NUMEROUS TO COUNT GRAM NEGATIVE RODS FEW GRAM POSITIVE COCCI    Culture   Final    HEAVY GROWTH  ESCHERICHIA COLI MODERATE GROWTH PROTEUS MIRABILIS LIGHT GROWTH KLEBSIELLA PNEUMONIAE MODERATE GROWTH ENTEROCOCCUS GALLINARUM CRITICAL RESULT CALLED TO, READ BACK BY AND VERIFIED WITH: [facility] BORBA AT 1042 08/16/15 DV    Report Status 08/17/2015 FINAL  Final   Organism ID, Bacteria ESCHERICHIA COLI  Final   Organism ID, Bacteria PROTEUS MIRABILIS  Final   Organism ID, Bacteria KLEBSIELLA PNEUMONIAE  Final   Organism ID, Bacteria ENTEROCOCCUS GALLINARUM  Final      Susceptibility   Escherichia coli - MIC*    AMPICILLIN >=32 RESISTANT Resistant     CEFTAZIDIME 4 RESISTANT Resistant     CEFAZOLIN >=64 RESISTANT Resistant     CEFTRIAXONE 16 RESISTANT Resistant     GENTAMICIN 2 SENSITIVE Sensitive     IMIPENEM >=16 RESISTANT Resistant     TRIMETH/SULFA <=20 SENSITIVE Sensitive     Extended ESBL POSITIVE Resistant     PIP/TAZO Value in next row Resistant      RESISTANT>=128    CIPROFLOXACIN Value in next row Sensitive      SENSITIVE<=0.25    * HEAVY GROWTH ESCHERICHIA COLI   Klebsiella pneumoniae - MIC*    AMPICILLIN Value in next row Resistant      SENSITIVE<=0.25    CEFTAZIDIME Value in next row Resistant      SENSITIVE<=0.25    CEFAZOLIN Value in next row Resistant      SENSITIVE<=0.25    CEFTRIAXONE Value in next row Resistant      SENSITIVE<=0.25    CIPROFLOXACIN Value in next row Resistant      SENSITIVE<=0.25    GENTAMICIN Value in next row Sensitive      SENSITIVE<=0.25    IMIPENEM Value in next row Resistant      SENSITIVE<=0.25    TRIMETH/SULFA Value in next row Resistant      SENSITIVE<=0.25    PIP/TAZO Value in next row Resistant      RESISTANT>=128    *  LIGHT GROWTH KLEBSIELLA PNEUMONIAE   Proteus mirabilis - MIC*    AMPICILLIN Value in next row Resistant      RESISTANT>=128    CEFTAZIDIME Value in next row Sensitive      RESISTANT>=128    CEFAZOLIN Value in next row Sensitive      RESISTANT>=128    CEFTRIAXONE Value in next row Sensitive      RESISTANT>=128    CIPROFLOXACIN Value in next row Sensitive      RESISTANT>=128    GENTAMICIN Value in next row Sensitive      RESISTANT>=128    IMIPENEM Value in next row Sensitive      RESISTANT>=128    TRIMETH/SULFA Value in next row Sensitive      RESISTANT>=128    PIP/TAZO Value in next row Sensitive      SENSITIVE<=4    * MODERATE GROWTH PROTEUS MIRABILIS   Enterococcus gallinarum - MIC*    AMPICILLIN Value in next row Resistant      SENSITIVE<=4    GENTAMICIN SYNERGY Value in next row Sensitive      SENSITIVE<=4    CIPROFLOXACIN Value in next row Resistant      RESISTANT>=8    TETRACYCLINE Value in next row Resistant      RESISTANT>=16    * MODERATE GROWTH ENTEROCOCCUS GALLINARUM  Wound culture     Status: None   Collection Time: 08/15/15  2:53 PM  Result Value Ref Range Status   Specimen Description WOUND  Final   Special Requests NONE  Final  Gram Stain FEW WBC SEEN NO ORGANISMS SEEN   Final   Culture NO GROWTH 3 DAYS  Final   Report Status 08/18/2015 FINAL  Final  C difficile quick scan w PCR reflex     Status: None   Collection Time: 08/17/15 11:34 AM  Result Value Ref Range Status   C Diff antigen NEGATIVE NEGATIVE Final   C Diff toxin NEGATIVE NEGATIVE Final   C Diff interpretation Negative for C. difficile  Final  Stool culture     Status: None   Collection Time: 09/03/15  3:51 PM  Result Value Ref Range Status   Specimen Description STOOL  Final   Special Requests Immunocompromised  Final   Culture   Final    NO SALMONELLA OR SHIGELLA ISOLATED No Pathogenic E. coli detected NO CAMPYLOBACTER DETECTED    Report Status 09/07/2015 FINAL  Final  C difficile  quick scan w PCR reflex     Status: None   Collection Time: 09/13/15 12:51 AM  Result Value Ref Range Status   C Diff antigen NEGATIVE NEGATIVE Final   C Diff toxin NEGATIVE NEGATIVE Final   C Diff interpretation Negative for C. difficile  Final  Culture, blood (routine x 2)     Status: None   Collection Time: 09/16/15 11:24 AM  Result Value Ref Range Status   Specimen Description BLOOD LEFT HAND  Final   Special Requests BOTTLES DRAWN AEROBIC AND ANAEROBIC  1CC  Final   Culture NO GROWTH 5 DAYS  Final   Report Status 09/21/2015 FINAL  Final  Culture, blood (routine x 2)     Status: None   Collection Time: 09/16/15 12:23 PM  Result Value Ref Range Status   Specimen Description BLOOD RIGHT HAND  Final   Special Requests BOTTLES DRAWN AEROBIC AND ANAEROBIC  1CC  Final   Culture  Setup Time   Final    GRAM POSITIVE COCCI AEROBIC BOTTLE ONLY CRITICAL RESULT CALLED TO, READ BACK BY AND VERIFIED WITH: TESS THOMAS,RN 09/17/2015 0631 BY JRS.    Culture   Final    ENTEROCOCCUS FAECALIS AEROBIC BOTTLE ONLY VRE HAVE INTRINSIC RESISTANCE TO MOST COMMONLY USED ANTIBIOTICS AND THE ABILITY TO ACQUIRE RESISTANCE TO MOST AVAILABLE ANTIBIOTICS. CRITICAL RESULT CALLED TO, READ BACK BY AND VERIFIED WITH: CHERYL SMITH AT 1610 09/19/15 DV    Report Status 09/21/2015 FINAL  Final   Organism ID, Bacteria ENTEROCOCCUS FAECALIS  Final      Susceptibility   Enterococcus faecalis - MIC*    AMPICILLIN <=2 SENSITIVE Sensitive     LINEZOLID 2 SENSITIVE Sensitive     CIPROFLOXACIN Value in next row Resistant      RESISTANT>=8    TETRACYCLINE Value in next row Resistant      RESISTANT>=16    VANCOMYCIN Value in next row Resistant      RESISTANT>=32    GENTAMICIN SYNERGY Value in next row Resistant      RESISTANT>=32    * ENTEROCOCCUS FAECALIS  Culture, respiratory (NON-Expectorated)     Status: None   Collection Time: 09/16/15  3:50 PM  Result Value Ref Range Status   Specimen Description TRACHEAL  ASPIRATE  Final   Special Requests Immunocompromised  Final   Gram Stain   Final    FEW WBC SEEN GOOD SPECIMEN - 80-90% WBCS RARE GRAM NEGATIVE RODS    Culture LIGHT GROWTH PSEUDOMONAS AERUGINOSA  Final   Report Status 09/19/2015 FINAL  Final   Organism ID, Bacteria PSEUDOMONAS AERUGINOSA  Final      Susceptibility   Pseudomonas aeruginosa - MIC*    CEFTAZIDIME 8 SENSITIVE Sensitive     CIPROFLOXACIN 2 INTERMEDIATE Intermediate     GENTAMICIN >=16 RESISTANT Resistant     IMIPENEM >=16 RESISTANT Resistant     PIP/TAZO Value in next row Sensitive      SENSITIVE32    CEFEPIME Value in next row Sensitive      SENSITIVE8    LEVOFLOXACIN Value in next row Resistant      RESISTANT>=8    * LIGHT GROWTH PSEUDOMONAS AERUGINOSA  Culture, expectorated sputum-assessment     Status: None   Collection Time: 09/22/15  2:09 PM  Result Value Ref Range Status   Specimen Description ENDOTRACHEAL  Final   Special Requests Normal  Final   Sputum evaluation THIS SPECIMEN IS ACCEPTABLE FOR SPUTUM CULTURE  Final   Report Status 09/24/2015 FINAL  Final  Culture, respiratory (NON-Expectorated)     Status: None   Collection Time: 09/22/15  2:09 PM  Result Value Ref Range Status   Specimen Description ENDOTRACHEAL  Final   Special Requests Normal Reflexed from [id number]  Final   Gram Stain   Final    FEW WBC SEEN FEW GRAM NEGATIVE RODS POOR SPECIMEN - LESS THAN 70% WBCS    Culture   Final    MODERATE GROWTH PSEUDOMONAS AERUGINOSA LIGHT GROWTH KLEBSIELLA PNEUMONIAE REFER TO SENSITIVITIES FROM PREVIOUS CULTURE FOR ORG 2    Report Status 10/01/2015 FINAL  Final   Organism ID, Bacteria PSEUDOMONAS AERUGINOSA  Final   Organism ID, Bacteria KLEBSIELLA PNEUMONIAE  Final      Susceptibility   Pseudomonas aeruginosa - MIC*    CEFTAZIDIME 4 SENSITIVE Sensitive     CIPROFLOXACIN 2 INTERMEDIATE Intermediate     GENTAMICIN >=16 RESISTANT Resistant     IMIPENEM >=16 RESISTANT Resistant     PIP/TAZO Value in  next row Sensitive      SENSITIVE16    * MODERATE GROWTH PSEUDOMONAS AERUGINOSA  Tissue culture     Status: None   Collection Time: 09/24/15  6:44 AM  Result Value Ref Range Status   Specimen Description BONE  Final   Special Requests Normal  Final   Gram Stain MODERATE WBC SEEN FEW GRAM NEGATIVE RODS   Final   Culture   Final    MODERATE GROWTH ESCHERICHIA COLI MODERATE GROWTH KLEBSIELLA PNEUMONIAE ESBL-EXTENDED SPECTRUM BETA LACTAMASE-THE ORGANISM IS RESISTANT TO PENICILLINS, CEPHALOSPORINS AND AZTREONAM ACCORDING TO CLSI M100-S15 VOL.25 N01 JAN 2005. ORGANISM 1 This organism isolate is resistant to one or more antiotic agents in three or more antimicrobial categories.  Suggest Infectious Disease consult.   ORGANISM 2 CRITICAL RESULT CALLED TO, READ BACK BY AND VERIFIED WITH: RN Mardene Celeste LINDSAY 09/27/15 1005AM    Report Status 09/28/2015 FINAL  Final   Organism ID, Bacteria ESCHERICHIA COLI  Final   Organism ID, Bacteria KLEBSIELLA PNEUMONIAE  Final      Susceptibility   Escherichia coli - MIC*    AMPICILLIN >=32 RESISTANT Resistant     CEFTAZIDIME 4 RESISTANT Resistant     CEFAZOLIN >=64 RESISTANT Resistant     CEFTRIAXONE 8 RESISTANT Resistant     GENTAMICIN <=1 SENSITIVE Sensitive     IMIPENEM 8 RESISTANT Resistant     TRIMETH/SULFA <=20 SENSITIVE Sensitive     Extended ESBL POSITIVE Resistant     PIP/TAZO Value in next row Resistant      RESISTANT>=128    * MODERATE GROWTH ESCHERICHIA  COLI   Klebsiella pneumoniae - MIC*    AMPICILLIN Value in next row Resistant      RESISTANT>=128    CEFTAZIDIME Value in next row Resistant      RESISTANT>=128    CEFAZOLIN Value in next row Resistant      RESISTANT>=128    CEFTRIAXONE Value in next row Resistant      RESISTANT>=128    CIPROFLOXACIN Value in next row Resistant      RESISTANT>=128    GENTAMICIN Value in next row Resistant      RESISTANT>=128    IMIPENEM Value in next row Resistant      RESISTANT>=128    TRIMETH/SULFA  Value in next row Sensitive      RESISTANT>=128    PIP/TAZO Value in next row Resistant      RESISTANT>=128    * MODERATE GROWTH KLEBSIELLA PNEUMONIAE  Anaerobic culture     Status: None   Collection Time: 09/24/15  3:55 PM  Result Value Ref Range Status   Specimen Description BONE  Final   Special Requests Normal  Final   Culture NO ANAEROBES ISOLATED  Final   Report Status 09/29/2015 FINAL  Final  Culture, fungus without smear     Status: None   Collection Time: 09/24/15  3:55 PM  Result Value Ref Range Status   Specimen Description BONE  Final   Special Requests Normal  Final   Culture NO FUNGUS ISOLATED AFTER 24 DAYS  Final   Report Status 10/18/2015 FINAL  Final  C difficile quick scan w PCR reflex     Status: None   Collection Time: 10/07/15  2:02 PM  Result Value Ref Range Status   C Diff antigen NEGATIVE NEGATIVE Final   C Diff toxin NEGATIVE NEGATIVE Final   C Diff interpretation Negative for C. difficile  Final  Culture, blood (routine x 2)     Status: None   Collection Time: 10/16/15  9:52 AM  Result Value Ref Range Status   Specimen Description BLOOD LEFT HAND  Final   Special Requests   Final    BOTTLES DRAWN AEROBIC AND ANAEROBIC  AER 6CC ANA 4CC   Culture NO GROWTH 6 DAYS  Final   Report Status 10/22/2015 FINAL  Final  Culture, blood (routine x 2)     Status: None   Collection Time: 10/16/15 12:25 PM  Result Value Ref Range Status   Specimen Description BLOOD LEFT HAND  Final   Special Requests BOTTLES DRAWN AEROBIC AND ANAEROBIC  5CC  Final   Culture NO GROWTH 6 DAYS  Final   Report Status 10/22/2015 FINAL  Final  Culture, expectorated sputum-assessment     Status: None   Collection Time: 10/18/15  1:15 PM  Result Value Ref Range Status   Specimen Description SPUTUM  Final   Special Requests NONE  Final   Sputum evaluation THIS SPECIMEN IS ACCEPTABLE FOR SPUTUM CULTURE  Final   Report Status 10/18/2015 FINAL  Final  Culture, respiratory  (NON-Expectorated)     Status: None   Collection Time: 10/18/15  1:15 PM  Result Value Ref Range Status   Specimen Description SPUTUM  Final   Special Requests NONE Reflexed from [id number]  Final   Gram Stain   Final    GOOD SPECIMEN - 80-90% WBCS MODERATE WBC SEEN MANY GRAM NEGATIVE RODS    Culture   Final    HEAVY GROWTH PSEUDOMONAS AERUGINOSA MODERATE GROWTH KLEBSIELLA PNEUMONIAE CRITICAL RESULT CALLED TO, READ BACK  BY AND VERIFIED WITHRod Mae AT 4098 10/21/15 DV KLEBSIELLA PNEUMONIAE This organism isolate is resistant to one or more antiotic agents in three or more antimicrobial categories.  Suggest Infectious Disease  consult.      Report Status 10/22/2015 FINAL  Final   Organism ID, Bacteria PSEUDOMONAS AERUGINOSA  Final   Organism ID, Bacteria KLEBSIELLA PNEUMONIAE  Final      Susceptibility   Klebsiella pneumoniae - MIC*    AMPICILLIN >=32 RESISTANT Resistant     CEFAZOLIN >=64 RESISTANT Resistant     CEFTRIAXONE >=64 RESISTANT Resistant     CIPROFLOXACIN >=4 RESISTANT Resistant     GENTAMICIN >=16 RESISTANT Resistant     IMIPENEM >=16 RESISTANT Resistant     NITROFURANTOIN >=512 RESISTANT Resistant     TRIMETH/SULFA <=20 SENSITIVE Sensitive     * MODERATE GROWTH KLEBSIELLA PNEUMONIAE   Pseudomonas aeruginosa - MIC*    CEFTAZIDIME 4 SENSITIVE Sensitive     CIPROFLOXACIN >=4 RESISTANT Resistant     GENTAMICIN 8 INTERMEDIATE Intermediate     IMIPENEM >=16 RESISTANT Resistant     PIP/TAZO Value in next row Sensitive      SENSITIVE32    * HEAVY GROWTH PSEUDOMONAS AERUGINOSA  C difficile quick scan w PCR reflex     Status: None   Collection Time: 10/18/15  4:39 PM  Result Value Ref Range Status   C Diff antigen NEGATIVE NEGATIVE Final   C Diff toxin NEGATIVE NEGATIVE Final   C Diff interpretation Negative for C. difficile  Final    Coagulation Studies: No results for input(s): LABPROT, INR in the last 72 hours.  Urinalysis: No results for input(s):  COLORURINE, LABSPEC, PHURINE, GLUCOSEU, HGBUR, BILIRUBINUR, KETONESUR, PROTEINUR, UROBILINOGEN, NITRITE, LEUKOCYTESUR in the last 72 hours.  Invalid input(s): APPERANCEUR    Imaging: No results found.   Medications:   . feeding supplement (JEVITY 1.5 CAL/FIBER) 1,000 mL (10/24/15 2200)  . norepinephrine Stopped (10/08/15 0530)   . amantadine  200 mg Per Tube Q7 days  . antiseptic oral rinse  7 mL Mouth Rinse QID  . aspirin  81 mg Oral Daily  . cefTAZidime (FORTAZ)  IV  1 g Intravenous Q24H  . chlorhexidine gluconate  15 mL Mouth Rinse BID  . epoetin (EPOGEN/PROCRIT) injection  10,000 Units Intravenous Q M,W,F-HD  . escitalopram  10 mg Oral Daily  . feeding supplement (PRO-STAT SUGAR FREE 64)  30 mL Oral 6 X Daily  . folic acid  1 mg Oral Daily  . free water  20 mL Per Tube 6 times per day  . heparin subcutaneous  5,000 Units Subcutaneous Q12H  . hydrocortisone  10 mg Per Tube BID  . insulin aspart  0-9 Units Subcutaneous 6 times per day  . ipratropium-albuterol  3 mL Nebulization QID  . lidocaine  1 patch Transdermal Q24H  . loperamide  2 mg Oral BID  . midodrine  5 mg Per Tube TID WC  . multivitamin  5 mL Oral Daily  . pantoprazole sodium  40 mg Per Tube Daily  . saccharomyces boulardii  250 mg Oral BID  . sodium chloride  10-40 mL Intracatheter Q12H  . sodium hypochlorite   Irrigation BID  . sulfamethoxazole-trimethoprim  2.5 tablet Per Tube q1800  . thiamine  100 mg Per Tube Daily   sodium chloride, sodium chloride, acetaminophen (TYLENOL) oral liquid 160 mg/5 mL, HYDROmorphone (DILAUDID) injection, loperamide, morphine injection, ondansetron (ZOFRAN) IV, oxyCODONE, sodium chloride, zinc oxide  Assessment/ Plan:  49  y.o. 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. Admitted on 08/09/2015  1. End-stage renal disease on hemodialysis on HD MWF. The patient has been on dialysis since October of 2014. R IJ permcath. Dialysis treatment with IV albumin for oncotic support. Midodrine and hydrocortisone ordered for blood pressure support.  -  Monitor daily for dialysis need. Dialysis yesterday, tolerated well with albumin, midodrine and hydrocortisone. UF of . Next treatment for tomorrow. Continue MWF schedule for now.   2. Anemia of CKD. Hemoglobin 7.6  - Continue to monitor CBC  - Continue scheduled Epogen for anemia of chronic kidney disease.  3. Secondary hyperparathyroidism: PTH low at 55 (9/25) - Phosphorous 3.9 and acceptable. She remains off Binder therapy.    4. Sepsis: VRE in blood. Bone cultures E. Coli and klebsiella - Treatment as per ID and ICU team: daptomycin completed on 11/10 and now on trimethoprim/sulfamethoxazole. -Pseudomonas growing in the sputum. Continue ceftazidime.  5. Acute resp failure -Pseudomonas noted in the sputum.  -Continue ventilator support, ceftazidime added to medication regimen. - Appreciate pulmonary input.    LOS: 74 [address] 12/6/20169:43 AM

## 2015-10-26 ENCOUNTER — Inpatient Hospital Stay: Payer: 59

## 2015-10-26 LAB — CBC
HEMATOCRIT: 25.8 % — AB (ref 35.0–47.0)
HEMOGLOBIN: 8 g/dL — AB (ref 12.0–16.0)
MCH: 29.9 pg (ref 26.0–34.0)
MCHC: 31.1 g/dL — ABNORMAL LOW (ref 32.0–36.0)
MCV: 96.1 fL (ref 80.0–100.0)
Platelets: 215 10*3/uL (ref 150–440)
RBC: 2.68 MIL/uL — AB (ref 3.80–5.20)
RDW: 16.6 % — AB (ref 11.5–14.5)
WBC: 9 10*3/uL (ref 3.6–11.0)

## 2015-10-26 LAB — RENAL FUNCTION PANEL
ALBUMIN: 2.2 g/dL — AB (ref 3.5–5.0)
ANION GAP: 9 (ref 5–15)
BUN: 83 mg/dL — ABNORMAL HIGH (ref 6–20)
CHLORIDE: 97 mmol/L — AB (ref 101–111)
CO2: 33 mmol/L — ABNORMAL HIGH (ref 22–32)
Calcium: 8.8 mg/dL — ABNORMAL LOW (ref 8.9–10.3)
Creatinine, Ser: 1.29 mg/dL — ABNORMAL HIGH (ref 0.44–1.00)
GFR, EST AFRICAN AMERICAN: 55 mL/min — AB (ref >60)
GFR, EST NON AFRICAN AMERICAN: 47 mL/min — AB (ref >60)
Glucose, Bld: 104 mg/dL — ABNORMAL HIGH (ref 65–99)
PHOSPHORUS: 4.5 mg/dL (ref 2.5–4.6)
POTASSIUM: 5.6 mmol/L — AB (ref 3.5–5.1)
Sodium: 139 mmol/L (ref 135–145)

## 2015-10-26 LAB — GLUCOSE, CAPILLARY
GLUCOSE-CAPILLARY: 105 mg/dL — AB (ref 65–99)
GLUCOSE-CAPILLARY: 106 mg/dL — AB (ref 65–99)
Glucose-Capillary: 137 mg/dL — ABNORMAL HIGH (ref 65–99)
Glucose-Capillary: 94 mg/dL (ref 65–99)
Glucose-Capillary: 111 mg/dL — ABNORMAL HIGH (ref 65–99)
Glucose-Capillary: 157 mg/dL — ABNORMAL HIGH (ref 65–99)

## 2015-10-26 NOTE — Progress Notes (Signed)
PT Cancellation Note  Patient Details Name: Courtney [name] MRN: [id number] DOB: 1966-05-02   Cancelled Treatment:    Reason Eval/Treat Not Completed: Patient at procedure or test/unavailable (Treatment session attempted; patient currently receiving dialysis.  Will re-attempt at later time/date as medically appropriate and patient available.)   Merve Hotard H. Manson PasseyBrown, PT, DPT, NCS 10/26/2015, 10:00 AM [phone]

## 2015-10-26 NOTE — Progress Notes (Signed)
Post HD Tx Assessment  

## 2015-10-26 NOTE — Progress Notes (Signed)
Pre HD Tx Assessment 

## 2015-10-26 NOTE — Progress Notes (Signed)
HD Tx Termination 

## 2015-10-26 NOTE — Progress Notes (Signed)
Pre HD Tx Report & Patient Assessment

## 2015-10-26 NOTE — Progress Notes (Signed)
Pt was seen to be grimacing and tearful after a bed change. When asked if she was in pain pt nodded. When asked if she would like some pain medicine pt nodded.  Pain medicine administered. Will continue to follow

## 2015-10-26 NOTE — Progress Notes (Signed)
Subjective:   No reported changes overnight. Hemodialysis for later today.   Objective:  Vital signs in last 24 hours:  Temp:  [97.9 F (36.6 C)-98.8 F (37.1 C)] 97.9 F (36.6 C) (12/07 0800) Pulse Rate:  [92-104] 93 (12/07 0800) Resp:  [0-36] 9 (12/07 0800) BP: (106-131)/(48-86) 119/69 mmHg (12/07 0800) SpO2:  [97 %-100 %] 97 % (12/07 0807) FiO2 (%):  [30 %-35 %] 30 % (12/07 0807)  Weight change:  Filed Weights    Intake/Output: I/O last 3 completed shifts: In: 2390 [I.V.:20; NG/GT:2220; IV Piggyback:150] Out: 600 [Other:600]   Intake/Output this shift:     Physical Exam: General: critically ill appearing  Head: NG in place, OM moist  Eyes: anicteric  Neck: Tracheostomy in place  Lungs:  Scattered rhonchi, vent assisted fio2 35%,   Heart: S1S2 no rubs  Abdomen:  Soft, nontender  Extremities: 1+ dependent edema, anasarca  Neurologic: Awake, not consistently following commands  Access:  R IJ permcath.       Basic Metabolic Panel:  Recent Labs Lab 10/21/15 1720 10/24/15 2119 10/25/15 0500  NA 138 140 136  K 4.8 4.8 4.8  CL 97* 99* 96*  CO2 33* 35* 33*  GLUCOSE 139* 132* 126*  BUN 69* 71* 60*  CREATININE 1.06* 1.10* 1.04*  CALCIUM 8.3* 8.3* 8.2*  PHOS 3.5  3.6 3.9  --     Liver Function Tests:  Recent Labs Lab 10/21/15 1720 10/24/15 2119  ALBUMIN 2.0* 1.9*   No results for input(s): LIPASE, AMYLASE in the last 168 hours. No results for input(s): AMMONIA in the last 168 hours.  CBC:  Recent Labs Lab 10/21/15 1720 10/21/15 2301 10/22/15 0457 10/24/15 2026 10/25/15 0500  WBC 11.4*  --  11.9* 7.4 8.0  HGB 6.7* 8.2* 7.8* 7.6* 7.6*  HCT 22.2* 26.1* 25.2* 24.9* 24.2*  MCV 96.7  --  95.3 97.3 95.8  PLT 172  --  170 183 195    Cardiac Enzymes: No results for input(s): CKTOTAL, CKMB, CKMBINDEX, TROPONINI in the last 168 hours.  BNP: Invalid input(s): POCBNP  CBG:  Recent Labs Lab 10/25/15 1604 10/25/15 1955 10/25/15 2351  10/26/15 0352 10/26/15 0739  GLUCAP 142* 149* 106* 137* 94    Microbiology: Results for orders placed or performed during the hospital encounter of 07/22/2015  Blood Culture (routine x 2)     Status: None   Collection Time: 07/26/2015  8:51 AM  Result Value Ref Range Status   Specimen Description BLOOD [name]  Final   Special Requests BOTTLES DRAWN AEROBIC AND ANAEROBIC  3CC  Final   Culture NO GROWTH 5 DAYS  Final   Report Status 08/17/2015 FINAL  Final  Blood Culture (routine x 2)     Status: None   Collection Time: 07/22/2015  9:20 AM  Result Value Ref Range Status   Specimen Description BLOOD LEFT ARM  Final   Special Requests BOTTLES DRAWN AEROBIC AND ANAEROBIC  1CC  Final   Culture NO GROWTH 5 DAYS  Final   Report Status 08/17/2015 FINAL  Final  Wound culture     Status: None   Collection Time: 08/02/2015  9:20 AM  Result Value Ref Range Status   Specimen Description DECUBITIS  Final   Special Requests Normal  Final   Gram Stain   Final    FEW WBC SEEN MANY GRAM NEGATIVE RODS RARE GRAM POSITIVE COCCI    Culture   Final    HEAVY GROWTH ESCHERICHIA COLI MODERATE GROWTH  ENTEROBACTER AEROGENES PROTEUS MIRABILIS HEAVY GROWTH ENTEROCOCCUS SPECIES VRE HAVE INTRINSIC RESISTANCE TO MOST COMMONLY USED ANTIBIOTICS AND THE ABILITY TO ACQUIRE RESISTANCE TO MOST AVAILABLE ANTIBIOTICS.    Report Status 08/16/2015 FINAL  Final   Organism ID, Bacteria ESCHERICHIA COLI  Final   Organism ID, Bacteria ENTEROBACTER AEROGENES  Final   Organism ID, Bacteria PROTEUS MIRABILIS  Final   Organism ID, Bacteria ENTEROCOCCUS SPECIES  Final      Susceptibility   Enterobacter aerogenes - MIC*    CEFTAZIDIME <=1 SENSITIVE Sensitive     CEFAZOLIN >=64 RESISTANT Resistant     CEFTRIAXONE <=1 SENSITIVE Sensitive     CIPROFLOXACIN <=0.25 SENSITIVE Sensitive     GENTAMICIN <=1 SENSITIVE Sensitive     IMIPENEM 1 SENSITIVE Sensitive     TRIMETH/SULFA <=20 SENSITIVE Sensitive     * MODERATE GROWTH  ENTEROBACTER AEROGENES   Escherichia coli - MIC*    AMPICILLIN <=2 SENSITIVE Sensitive     CEFTAZIDIME <=1 SENSITIVE Sensitive     CEFAZOLIN <=4 SENSITIVE Sensitive     CEFTRIAXONE <=1 SENSITIVE Sensitive     CIPROFLOXACIN <=0.25 SENSITIVE Sensitive     GENTAMICIN <=1 SENSITIVE Sensitive     IMIPENEM <=0.25 SENSITIVE Sensitive     TRIMETH/SULFA <=20 SENSITIVE Sensitive     * HEAVY GROWTH ESCHERICHIA COLI   Proteus mirabilis - MIC*    AMPICILLIN >=32 RESISTANT Resistant     CEFTAZIDIME <=1 SENSITIVE Sensitive     CEFAZOLIN 8 SENSITIVE Sensitive     CEFTRIAXONE <=1 SENSITIVE Sensitive     CIPROFLOXACIN <=0.25 SENSITIVE Sensitive     GENTAMICIN <=1 SENSITIVE Sensitive     IMIPENEM 1 SENSITIVE Sensitive     TRIMETH/SULFA <=20 SENSITIVE Sensitive     * PROTEUS MIRABILIS   Enterococcus species - MIC*    AMPICILLIN >=32 RESISTANT Resistant     VANCOMYCIN >=32 RESISTANT Resistant     GENTAMICIN SYNERGY SENSITIVE Sensitive     TETRACYCLINE Value in next row Resistant      RESISTANT>=16    * HEAVY GROWTH ENTEROCOCCUS SPECIES  MRSA PCR Screening     Status: None   Collection Time: 08/02/2015  2:38 PM  Result Value Ref Range Status   MRSA by PCR NEGATIVE NEGATIVE Final    Comment:        The GeneXpert MRSA Assay (FDA approved for NASAL specimens only), is one component of a comprehensive MRSA colonization surveillance program. It is not intended to diagnose MRSA infection nor to guide or monitor treatment for MRSA infections.   Blood culture (single)     Status: None   Collection Time: 07/31/2015  3:36 PM  Result Value Ref Range Status   Specimen Description BLOOD RIGHT ASSIST CONTROL  Final   Special Requests BOTTLES DRAWN AEROBIC AND ANAEROBIC  Final   Culture NO GROWTH 5 DAYS  Final   Report Status 08/17/2015 FINAL  Final  Wound culture     Status: None   Collection Time: 08/13/15 12:37 PM  Result Value Ref Range Status   Specimen Description WOUND  Final   Special  Requests Normal  Final   Gram Stain   Final    FEW WBC SEEN TOO NUMEROUS TO COUNT GRAM NEGATIVE RODS FEW GRAM POSITIVE COCCI    Culture   Final    HEAVY GROWTH ESCHERICHIA COLI MODERATE GROWTH PROTEUS MIRABILIS LIGHT GROWTH KLEBSIELLA PNEUMONIAE MODERATE GROWTH ENTEROCOCCUS GALLINARUM CRITICAL RESULT CALLED TO, READ BACK BY AND VERIFIED WITH: [employer] BORBA AT  1042 08/16/15 DV    Report Status 08/17/2015 FINAL  Final   Organism ID, Bacteria ESCHERICHIA COLI  Final   Organism ID, Bacteria PROTEUS MIRABILIS  Final   Organism ID, Bacteria KLEBSIELLA PNEUMONIAE  Final   Organism ID, Bacteria ENTEROCOCCUS GALLINARUM  Final      Susceptibility   Escherichia coli - MIC*    AMPICILLIN >=32 RESISTANT Resistant     CEFTAZIDIME 4 RESISTANT Resistant     CEFAZOLIN >=64 RESISTANT Resistant     CEFTRIAXONE 16 RESISTANT Resistant     GENTAMICIN 2 SENSITIVE Sensitive     IMIPENEM >=16 RESISTANT Resistant     TRIMETH/SULFA <=20 SENSITIVE Sensitive     Extended ESBL POSITIVE Resistant     PIP/TAZO Value in next row Resistant      RESISTANT>=128    CIPROFLOXACIN Value in next row Sensitive      SENSITIVE<=0.25    * HEAVY GROWTH ESCHERICHIA COLI   Klebsiella pneumoniae - MIC*    AMPICILLIN Value in next row Resistant      SENSITIVE<=0.25    CEFTAZIDIME Value in next row Resistant      SENSITIVE<=0.25    CEFAZOLIN Value in next row Resistant      SENSITIVE<=0.25    CEFTRIAXONE Value in next row Resistant      SENSITIVE<=0.25    CIPROFLOXACIN Value in next row Resistant      SENSITIVE<=0.25    GENTAMICIN Value in next row Sensitive      SENSITIVE<=0.25    IMIPENEM Value in next row Resistant      SENSITIVE<=0.25    TRIMETH/SULFA Value in next row Resistant      SENSITIVE<=0.25    PIP/TAZO Value in next row Resistant      RESISTANT>=128    * LIGHT GROWTH KLEBSIELLA PNEUMONIAE   Proteus mirabilis - MIC*    AMPICILLIN Value in next row Resistant      RESISTANT>=128    CEFTAZIDIME Value in  next row Sensitive      RESISTANT>=128    CEFAZOLIN Value in next row Sensitive      RESISTANT>=128    CEFTRIAXONE Value in next row Sensitive      RESISTANT>=128    CIPROFLOXACIN Value in next row Sensitive      RESISTANT>=128    GENTAMICIN Value in next row Sensitive      RESISTANT>=128    IMIPENEM Value in next row Sensitive      RESISTANT>=128    TRIMETH/SULFA Value in next row Sensitive      RESISTANT>=128    PIP/TAZO Value in next row Sensitive      SENSITIVE<=4    * MODERATE GROWTH PROTEUS MIRABILIS   Enterococcus gallinarum - MIC*    AMPICILLIN Value in next row Resistant      SENSITIVE<=4    GENTAMICIN SYNERGY Value in next row Sensitive      SENSITIVE<=4    CIPROFLOXACIN Value in next row Resistant      RESISTANT>=8    TETRACYCLINE Value in next row Resistant      RESISTANT>=16    * MODERATE GROWTH ENTEROCOCCUS GALLINARUM  Wound culture     Status: None   Collection Time: 08/15/15  2:53 PM  Result Value Ref Range Status   Specimen Description WOUND  Final   Special Requests NONE  Final   Gram Stain FEW WBC SEEN NO ORGANISMS SEEN   Final   Culture NO GROWTH 3 DAYS  Final   Report Status 08/18/2015  FINAL  Final  C difficile quick scan w PCR reflex     Status: None   Collection Time: 08/17/15 11:34 AM  Result Value Ref Range Status   C Diff antigen NEGATIVE NEGATIVE Final   C Diff toxin NEGATIVE NEGATIVE Final   C Diff interpretation Negative for C. difficile  Final  Stool culture     Status: None   Collection Time: 09/03/15  3:51 PM  Result Value Ref Range Status   Specimen Description STOOL  Final   Special Requests Immunocompromised  Final   Culture   Final    NO SALMONELLA OR SHIGELLA ISOLATED No Pathogenic E. coli detected NO CAMPYLOBACTER DETECTED    Report Status 09/07/2015 FINAL  Final  C difficile quick scan w PCR reflex     Status: None   Collection Time: 09/13/15 12:51 AM  Result Value Ref Range Status   C Diff antigen NEGATIVE NEGATIVE Final    C Diff toxin NEGATIVE NEGATIVE Final   C Diff interpretation Negative for C. difficile  Final  Culture, blood (routine x 2)     Status: None   Collection Time: 09/16/15 11:24 AM  Result Value Ref Range Status   Specimen Description BLOOD LEFT HAND  Final   Special Requests BOTTLES DRAWN AEROBIC AND ANAEROBIC  1CC  Final   Culture NO GROWTH 5 DAYS  Final   Report Status 09/21/2015 FINAL  Final  Culture, blood (routine x 2)     Status: None   Collection Time: 09/16/15 12:23 PM  Result Value Ref Range Status   Specimen Description BLOOD RIGHT HAND  Final   Special Requests BOTTLES DRAWN AEROBIC AND ANAEROBIC  1CC  Final   Culture  Setup Time   Final    GRAM POSITIVE COCCI AEROBIC BOTTLE ONLY CRITICAL RESULT CALLED TO, READ BACK BY AND VERIFIED WITH: TESS THOMAS,RN 09/17/2015 0631 BY JRS.    Culture   Final    ENTEROCOCCUS FAECALIS AEROBIC BOTTLE ONLY VRE HAVE INTRINSIC RESISTANCE TO MOST COMMONLY USED ANTIBIOTICS AND THE ABILITY TO ACQUIRE RESISTANCE TO MOST AVAILABLE ANTIBIOTICS. CRITICAL RESULT CALLED TO, READ BACK BY AND VERIFIED WITH: CHERYL SMITH AT 1610 09/19/15 DV    Report Status 09/21/2015 FINAL  Final   Organism ID, Bacteria ENTEROCOCCUS FAECALIS  Final      Susceptibility   Enterococcus faecalis - MIC*    AMPICILLIN <=2 SENSITIVE Sensitive     LINEZOLID 2 SENSITIVE Sensitive     CIPROFLOXACIN Value in next row Resistant      RESISTANT>=8    TETRACYCLINE Value in next row Resistant      RESISTANT>=16    VANCOMYCIN Value in next row Resistant      RESISTANT>=32    GENTAMICIN SYNERGY Value in next row Resistant      RESISTANT>=32    * ENTEROCOCCUS FAECALIS  Culture, respiratory (NON-Expectorated)     Status: None   Collection Time: 09/16/15  3:50 PM  Result Value Ref Range Status   Specimen Description TRACHEAL ASPIRATE  Final   Special Requests Immunocompromised  Final   Gram Stain   Final    FEW WBC SEEN GOOD SPECIMEN - 80-90% WBCS RARE GRAM NEGATIVE RODS     Culture LIGHT GROWTH PSEUDOMONAS AERUGINOSA  Final   Report Status 09/19/2015 FINAL  Final   Organism ID, Bacteria PSEUDOMONAS AERUGINOSA  Final      Susceptibility   Pseudomonas aeruginosa - MIC*    CEFTAZIDIME 8 SENSITIVE Sensitive  CIPROFLOXACIN 2 INTERMEDIATE Intermediate     GENTAMICIN >=16 RESISTANT Resistant     IMIPENEM >=16 RESISTANT Resistant     PIP/TAZO Value in next row Sensitive      SENSITIVE32    CEFEPIME Value in next row Sensitive      SENSITIVE8    LEVOFLOXACIN Value in next row Resistant      RESISTANT>=8    * LIGHT GROWTH PSEUDOMONAS AERUGINOSA  Culture, expectorated sputum-assessment     Status: None   Collection Time: 09/22/15  2:09 PM  Result Value Ref Range Status   Specimen Description ENDOTRACHEAL  Final   Special Requests Normal  Final   Sputum evaluation THIS SPECIMEN IS ACCEPTABLE FOR SPUTUM CULTURE  Final   Report Status 09/24/2015 FINAL  Final  Culture, respiratory (NON-Expectorated)     Status: None   Collection Time: 09/22/15  2:09 PM  Result Value Ref Range Status   Specimen Description ENDOTRACHEAL  Final   Special Requests Normal Reflexed from [id number]  Final   Gram Stain   Final    FEW WBC SEEN FEW GRAM NEGATIVE RODS POOR SPECIMEN - LESS THAN 70% WBCS    Culture   Final    MODERATE GROWTH PSEUDOMONAS AERUGINOSA LIGHT GROWTH KLEBSIELLA PNEUMONIAE REFER TO SENSITIVITIES FROM PREVIOUS CULTURE FOR ORG 2    Report Status 10/01/2015 FINAL  Final   Organism ID, Bacteria PSEUDOMONAS AERUGINOSA  Final   Organism ID, Bacteria KLEBSIELLA PNEUMONIAE  Final      Susceptibility   Pseudomonas aeruginosa - MIC*    CEFTAZIDIME 4 SENSITIVE Sensitive     CIPROFLOXACIN 2 INTERMEDIATE Intermediate     GENTAMICIN >=16 RESISTANT Resistant     IMIPENEM >=16 RESISTANT Resistant     PIP/TAZO Value in next row Sensitive      SENSITIVE16    * MODERATE GROWTH PSEUDOMONAS AERUGINOSA  Tissue culture     Status: None   Collection Time: 09/24/15  6:44 AM   Result Value Ref Range Status   Specimen Description BONE  Final   Special Requests Normal  Final   Gram Stain MODERATE WBC SEEN FEW GRAM NEGATIVE RODS   Final   Culture   Final    MODERATE GROWTH ESCHERICHIA COLI MODERATE GROWTH KLEBSIELLA PNEUMONIAE ESBL-EXTENDED SPECTRUM BETA LACTAMASE-THE ORGANISM IS RESISTANT TO PENICILLINS, CEPHALOSPORINS AND AZTREONAM ACCORDING TO CLSI M100-S15 VOL.25 N01 JAN 2005. ORGANISM 1 This organism isolate is resistant to one or more antiotic agents in three or more antimicrobial categories.  Suggest Infectious Disease consult.   ORGANISM 2 CRITICAL RESULT CALLED TO, READ BACK BY AND VERIFIED WITH: RN Mardene Celeste LINDSAY 09/27/15 1005AM    Report Status 09/28/2015 FINAL  Final   Organism ID, Bacteria ESCHERICHIA COLI  Final   Organism ID, Bacteria KLEBSIELLA PNEUMONIAE  Final      Susceptibility   Escherichia coli - MIC*    AMPICILLIN >=32 RESISTANT Resistant     CEFTAZIDIME 4 RESISTANT Resistant     CEFAZOLIN >=64 RESISTANT Resistant     CEFTRIAXONE 8 RESISTANT Resistant     GENTAMICIN <=1 SENSITIVE Sensitive     IMIPENEM 8 RESISTANT Resistant     TRIMETH/SULFA <=20 SENSITIVE Sensitive     Extended ESBL POSITIVE Resistant     PIP/TAZO Value in next row Resistant      RESISTANT>=128    * MODERATE GROWTH ESCHERICHIA COLI   Klebsiella pneumoniae - MIC*    AMPICILLIN Value in next row Resistant      RESISTANT>=128  CEFTAZIDIME Value in next row Resistant      RESISTANT>=128    CEFAZOLIN Value in next row Resistant      RESISTANT>=128    CEFTRIAXONE Value in next row Resistant      RESISTANT>=128    CIPROFLOXACIN Value in next row Resistant      RESISTANT>=128    GENTAMICIN Value in next row Resistant      RESISTANT>=128    IMIPENEM Value in next row Resistant      RESISTANT>=128    TRIMETH/SULFA Value in next row Sensitive      RESISTANT>=128    PIP/TAZO Value in next row Resistant      RESISTANT>=128    * MODERATE GROWTH KLEBSIELLA  PNEUMONIAE  Anaerobic culture     Status: None   Collection Time: 09/24/15  3:55 PM  Result Value Ref Range Status   Specimen Description BONE  Final   Special Requests Normal  Final   Culture NO ANAEROBES ISOLATED  Final   Report Status 09/29/2015 FINAL  Final  Culture, fungus without smear     Status: None   Collection Time: 09/24/15  3:55 PM  Result Value Ref Range Status   Specimen Description BONE  Final   Special Requests Normal  Final   Culture NO FUNGUS ISOLATED AFTER 24 DAYS  Final   Report Status 10/18/2015 FINAL  Final  C difficile quick scan w PCR reflex     Status: None   Collection Time: 10/07/15  2:02 PM  Result Value Ref Range Status   C Diff antigen NEGATIVE NEGATIVE Final   C Diff toxin NEGATIVE NEGATIVE Final   C Diff interpretation Negative for C. difficile  Final  Culture, blood (routine x 2)     Status: None   Collection Time: 10/16/15  9:52 AM  Result Value Ref Range Status   Specimen Description BLOOD LEFT HAND  Final   Special Requests   Final    BOTTLES DRAWN AEROBIC AND ANAEROBIC  AER 6CC ANA 4CC   Culture NO GROWTH 6 DAYS  Final   Report Status 10/22/2015 FINAL  Final  Culture, blood (routine x 2)     Status: None   Collection Time: 10/16/15 12:25 PM  Result Value Ref Range Status   Specimen Description BLOOD LEFT HAND  Final   Special Requests BOTTLES DRAWN AEROBIC AND ANAEROBIC  5CC  Final   Culture NO GROWTH 6 DAYS  Final   Report Status 10/22/2015 FINAL  Final  Culture, expectorated sputum-assessment     Status: None   Collection Time: 10/18/15  1:15 PM  Result Value Ref Range Status   Specimen Description SPUTUM  Final   Special Requests NONE  Final   Sputum evaluation THIS SPECIMEN IS ACCEPTABLE FOR SPUTUM CULTURE  Final   Report Status 10/18/2015 FINAL  Final  Culture, respiratory (NON-Expectorated)     Status: None   Collection Time: 10/18/15  1:15 PM  Result Value Ref Range Status   Specimen Description SPUTUM  Final   Special  Requests NONE Reflexed from [id number]  Final   Gram Stain   Final    GOOD SPECIMEN - 80-90% WBCS MODERATE WBC SEEN MANY GRAM NEGATIVE RODS    Culture   Final    HEAVY GROWTH PSEUDOMONAS AERUGINOSA MODERATE GROWTH KLEBSIELLA PNEUMONIAE CRITICAL RESULT CALLED TO, READ BACK BY AND VERIFIED WITHRod Mae: JOANNA LINDSAY AT [id number] 10/21/15 DV KLEBSIELLA PNEUMONIAE This organism isolate is resistant to one or more antiotic agents in three  or more antimicrobial categories.  Suggest Infectious Disease  consult.      Report Status 10/22/2015 FINAL  Final   Organism ID, Bacteria PSEUDOMONAS AERUGINOSA  Final   Organism ID, Bacteria KLEBSIELLA PNEUMONIAE  Final      Susceptibility   Klebsiella pneumoniae - MIC*    AMPICILLIN >=32 RESISTANT Resistant     CEFAZOLIN >=64 RESISTANT Resistant     CEFTRIAXONE >=64 RESISTANT Resistant     CIPROFLOXACIN >=4 RESISTANT Resistant     GENTAMICIN >=16 RESISTANT Resistant     IMIPENEM >=16 RESISTANT Resistant     NITROFURANTOIN >=512 RESISTANT Resistant     TRIMETH/SULFA <=20 SENSITIVE Sensitive     * MODERATE GROWTH KLEBSIELLA PNEUMONIAE   Pseudomonas aeruginosa - MIC*    CEFTAZIDIME 4 SENSITIVE Sensitive     CIPROFLOXACIN >=4 RESISTANT Resistant     GENTAMICIN 8 INTERMEDIATE Intermediate     IMIPENEM >=16 RESISTANT Resistant     PIP/TAZO Value in next row Sensitive      SENSITIVE32    * HEAVY GROWTH PSEUDOMONAS AERUGINOSA  C difficile quick scan w PCR reflex     Status: None   Collection Time: 10/18/15  4:39 PM  Result Value Ref Range Status   C Diff antigen NEGATIVE NEGATIVE Final   C Diff toxin NEGATIVE NEGATIVE Final   C Diff interpretation Negative for C. difficile  Final    Coagulation Studies: No results for input(s): LABPROT, INR in the last 72 hours.  Urinalysis: No results for input(s): COLORURINE, LABSPEC, PHURINE, GLUCOSEU, HGBUR, BILIRUBINUR, KETONESUR, PROTEINUR, UROBILINOGEN, NITRITE, LEUKOCYTESUR in the last 72 hours.  Invalid input(s):  APPERANCEUR    Imaging: Dg Chest Port 1 View  10/26/2015  CLINICAL DATA:  Respiratory failure, aspiration pneumonia, septic shock, CVA, history of coronary artery disease CHF and renal insufficiency EXAM: PORTABLE CHEST 1 VIEW COMPARISON:  Portable chest x-ray of October 19, 2015 FINDINGS: The lungs are adequately inflated. There are persistent confluent alveolar opacities bilaterally. The left hemidiaphragm is partially obscured but slightly better demonstrated than on the previous study. The cardiac silhouette remains enlarged. The central pulmonary vascularity remains engorged. A dual-lumen dialysis catheter is in stable position with the distal tip at the cavoatrial junction. The feeding tube tip projects below the inferior margin of the image. The left internal jugular venous catheter tip projects over the proximal SVC. The tracheostomy appliance tip lies at the level of the superior margin of the clavicular heads. IMPRESSION: Stable bilateral alveolar opacities compatible with pneumonia and/or pulmonary edema. Slight improved aeration of the left lower lobe. The support tubes are in reasonable position. Electronically Signed   By: David  [place] M.D.   On: 10/26/2015 07:15     Medications:   . feeding supplement (JEVITY 1.5 CAL/FIBER) 1,000 mL (10/25/15 1824)  . norepinephrine Stopped (10/08/15 0530)   . amantadine  200 mg Per Tube Q7 days  . antiseptic oral rinse  7 mL Mouth Rinse QID  . aspirin  81 mg Oral Daily  . cefTAZidime (FORTAZ)  IV  1 g Intravenous Q24H  . chlorhexidine gluconate  15 mL Mouth Rinse BID  . epoetin (EPOGEN/PROCRIT) injection  10,000 Units Intravenous Q M,W,F-HD  . escitalopram  10 mg Oral Daily  . feeding supplement (PRO-STAT SUGAR FREE 64)  30 mL Oral 6 X Daily  . folic acid  1 mg Oral Daily  . free water  20 mL Per Tube 6 times per day  . heparin subcutaneous  5,000 Units Subcutaneous  Q12H  . hydrocortisone  10 mg Per Tube BID  . insulin aspart  0-9 Units  Subcutaneous 6 times per day  . ipratropium-albuterol  3 mL Nebulization QID  . lidocaine  1 patch Transdermal Q24H  . loperamide  2 mg Oral BID  . midodrine  5 mg Per Tube TID WC  . multivitamin  5 mL Oral Daily  . pantoprazole sodium  40 mg Per Tube Daily  . saccharomyces boulardii  250 mg Oral BID  . sodium chloride  10-40 mL Intracatheter Q12H  . sodium hypochlorite   Irrigation BID  . sulfamethoxazole-trimethoprim  2.5 tablet Per Tube q1800  . thiamine  100 mg Per Tube Daily   sodium chloride, sodium chloride, acetaminophen (TYLENOL) oral liquid 160 mg/5 mL, HYDROmorphone (DILAUDID) injection, loperamide, morphine injection, ondansetron (ZOFRAN) IV, oxyCODONE, sodium chloride, zinc oxide  Assessment/ Plan:  49 y.o. 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 Admitted on 08-13-2015  1. End-stage renal disease on hemodialysis on HD MWF. The patient has been on dialysis since October of 2014. R IJ permcath. Dialysis treatment with IV albumin for oncotic support. Midodrine and hydrocortisone ordered for blood pressure support.  -  Monitor daily for dialysis need. Dialysis for later today: albumin, midodrine and hydrocortisone for hemodynamic support.  Continue MWF schedule for now.  UF goal of 0.5 to 1 kg.   2. Anemia of CKD. Hemoglobin 7.6  - Continue to monitor CBC  - Continue scheduled Epogen for anemia of chronic  kidney disease.  3. Secondary hyperparathyroidism: PTH low at 55 (9/25) - Phosphorous 3.9 and acceptable. She remains off Binder therapy.    4. Sepsis: VRE in blood. Bone cultures E. Coli and klebsiella - Treatment as per ID and ICU team: daptomycin completed on 11/10 and now on trimethoprim/sulfamethoxazole. -Pseudomonas growing in the sputum. Continue ceftazidime.  5. Acute resp failure -Pseudomonas noted in the sputum.  -Continue ventilator support, ceftazidime added to medication regimen. - Appreciate pulmonary input.    LOS: 75 Courtney [name] 12/7/20168:19 AM

## 2015-10-26 NOTE — Progress Notes (Signed)
Post HD Tx Documentation 

## 2015-10-26 NOTE — Progress Notes (Signed)
MEDICATION RELATED CONSULT NOTE - Follow up   Pharmacy Consult for Renal dosing  Allergies  Allergen Reactions  . Contrast Media [Iodinated Diagnostic Agents] Anaphylaxis  . Ampicillin Rash    Patient Measurements: Height:  (SCALE BROKE) Weight:  (Bedscale unavailable) IBW/kg (Calculated) : 66.2  Vital Signs: Temp: 96.4 F (35.8 C) (12/07 1315) Temp Source: Axillary (12/07 1315) BP: 135/95 mmHg (12/07 1315) Pulse Rate: 106 (12/07 1315)  Recent Labs  10/24/15 2026 10/24/15 2119 10/25/15 0500 10/26/15 1116  WBC 7.4  --  8.0 9.0  HGB 7.6*  --  7.6* 8.0*  HCT 24.9*  --  24.2* 25.8*  PLT 183  --  195 215  CREATININE  --  1.10* 1.04* 1.29*  PHOS  --  3.9  --  4.5  ALBUMIN  --  1.9*  --  2.2*   Estimated Creatinine Clearance: 64.7 mL/min (by C-G formula based on Cr of 1.29).   Assessment: 49 yo patient on HD. Pharmacy consulted for renal dosing of medications.   Plan:  No medications require adjustment at present. Pharmacy will continue to monitor patients medications and dose adjust as needed.    Luisa HartScott Laneisha Mino, PharmD  10/26/2015

## 2015-10-26 NOTE — Progress Notes (Addendum)
Pharmacy Antibiotic Time-Out Note  Courtney [name] is a 49 y.o. year-old female admitted on 09/05/15.  The patient is currently on ceftazidime for PNA.  Assessment/Plan: After discussion with Dr. Dema SeverinMungal, will d/c ceftazidime 12/8 for a total of 8 days of therapy.    Temp (24hrs), Avg:97.9 F (36.6 C), Min:96.4 F (35.8 C), Max:98.8 F (37.1 C)   Recent Labs Lab 10/21/15 1720 10/22/15 0457 10/24/15 2026 10/25/15 0500 10/26/15 1116  WBC 11.4* 11.9* 7.4 8.0 9.0    Recent Labs Lab 10/21/15 1720 10/24/15 2119 10/25/15 0500 10/26/15 1116  CREATININE 1.06* 1.10* 1.04* 1.29*   Estimated Creatinine Clearance: 64.7 mL/min (by C-G formula based on Cr of 1.29).   Antimicrobial allergies: ampicillin  Courtney [name], Courtney [name] D PharmD 10/26/2015 3:08 PM  12/08 Addendum Spoke to Dr. Dema SeverinMungal to day who wants to continue ceftazidime through 12/11 due to patiens's clinical condition.

## 2015-10-26 NOTE — Progress Notes (Signed)
Pre HD TX Machine & Pt Checks 

## 2015-10-26 NOTE — Progress Notes (Signed)
Pt is resting and denying pain or discomfort.   Goal: Tolerating diet Outcome: Progressing, Pt is tolerating Jevity 1.5 at 50 ml/hr. Had no stools on my shift.  Goal: Pain controlled on oral analgesia Outcome:  Progressing Dilaudid for dressing change, and oxycodone x2 to control pain  Goal: Other Phase II Outcomes/Goals Outcome: Progressing Wound care to sacrum done x 1 with wet to dry Dakins today. Wound with 90% granulation and 10% yellow slough.  Edges are rolled and tunneling noted.

## 2015-10-26 NOTE — Progress Notes (Signed)
[facility] [place] Critical Care Medicine Progess Note  Name: Courtney GrinderHedda McMillian [place] MRN: [id number] DOB: 12/20/1965    ADMISSION DATE:  08/19/2015   INITIAL PRESENTATION:  49 AAF who has been in medical facilities (hosp, LTAC, rehab) for 2 yrs following gastric bypass surgery with multiple complications. Now with chronic trach, ESRD, profound debilitation, severe sacral pressure ulcer. Was seemingly making progress and transferred to rehab facility approx one week prior to this admission. [facility] and hypotension. Working dx of severe sepsis/septic shock due to infected sacral pressure ulcer. Since admission. Her course is been very complicated with numerous complications including septic shock and GI bleeding.    INTERVAL HISTORY: The patient is arousable, tolerated 8hrs  PSV (12/5) yesterday. CXR with stable atlectasis/small effusions  Summary of MAJOR EVENTS/TEST RESULTS: Admission 02/07/14-05/07/14 Admission 07/21/14-09/06/14 Discharged to [facility]. Pt had palliative consult at that time, were asked to sign off by husband.  09/23 CT head: NAD 09/23 EEG: no epileptiform activity 09/23 PRBCs for Hgb 6.4 09/24 bedside debridement of sacral wound. Abscess drained 09/25 Off vasopressors. More alert. No distress. Worsening thrombocytopenia. Vanc DC'd 09/29  Dr. [name] (I.D) excused from the case by patient's husband. 10/02 MRI -multiple infarcts 10/03 tracheal bleeding- transfused platelets 10/03 hospitalist service excused from the case by patient's husband 10/03 Echocardiogram ejection fraction was 55-60%, pulmonary systolic pressure was 39 mmHg 78/4610/08 restart TF's at lower rate, attempt reg HD  10/12 Transferred to med-surg floor. Remains on PCCM service 10/14 SLP eval: pt unable to tolerate PMV adequately 10/17-will re-attempt PM valve-discussed with Speech therapist 10/18 passed swallow eval-start pureed thick foods no thin liquids-continue NG feeds 10/19  transferred to step down for sepsis/aspiration pneumonia 10/19 cxr shows RLL opacity 10/21 sacral decub debride at bedside by surgery 10/22 started back on vasopressors while on HD 10/27 CT with osteo, R hip fx, unable to identify tip of dubhoff tube - sent for fluoro study 10/28 Ortho consultation: I do not feel that she is a surgical candidate. Therefore, I feel that it would best to manage this fracture nonsurgically and allow it to heal by itself over time, which it should.  10/28 Gen Surg consultation: Due to the lack of free air or free flow of contrast and the peritoneum there is no indication for any surgical intervention on this. Would recommend pulling back the feeding tube 1-2 cm. Would recheck an abdominal film to confirm no pneumoperitoneum in the morning. Absence of any changes okay to continue using Dobbhoff for feeding and medications. 10/28 gastrograffin study: The study confirms that the feeding tube pes perforated through the duodenum and the tip is within a cavity that fills with injected contrast. The cavity does appear walled off 11/4 refuses oral feeds, continue TF's CT reviewed: T8-T9 discitis/osteomyelitis, RT HIP FRACTURE 11/5 placed back on vasopressors, placed back on Vent due to resp acidosis, levophed turned off 11/6 afternoon 11/5 PM Trach changed out due to cuff leak, #6 Shiley cuffed, 11/6 dubhoff occluded, removed and replaced, new dubhoff shows tube in the antral stomach 11/15 refusing to take oral feeds 11/18 placed back on Vent for increased WOB,SOB. 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 10/18/2015; the patient developed a new left basal pneumonia; with recurrent sepsis.  10/19/2015; fevers resolved with IV acetaminophen. Tube feeds changed from vital to Jevity.    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 Pneumoniae, P. Mirabilis, VRE Wound 9/26 >> No growth Blood 9/23 >> NEG CDiff 9/27>>neg Stool Cx 10/15>> negative Cdiff 10/25>>neg Trach Aspirate 10/28>> light growth pseudomonas Blood 10/28 >> 1/2 GPC >>  Sputum cultures obtained 09/22/15 due to mucus plugging>>Pseudomonas BONE TISSUE Cx 11/3>>E.coli (ESBL), k. Pneumonia (daptomycin and septra) Bld Cx 11/27>>negative thus far.  Trach Asp 11/27>> grew out Pseudomonas, and Klebsiella pneumonia.  ANTIMICROBIALS:  Aztreonam 9/23 >> 9/24 Vanc 9/23 >> 9/25 Vanc 9/26>>9/27 Daptomycin 9/27>> 10/12, 10/28>> off Meropenem 9/23 >> 10/14, 10/28 >> 10/31 Septra 11/8>> Zosyn 11/27,>> 11/30. Levaquin 11/29>> 11/29. Ceftaz 12/1>>  VITAL SIGNS: Temp:  [97.3 F (36.3 C)-98.8 F (37.1 C)] 97.3 F (36.3 C) (12/07 0900) Pulse Rate:  [92-104] 102 (12/07 1115) Resp:  [0-36] 28 (12/07 1115) BP: (106-131)/(36-77) 122/53 mmHg (12/07 1115) SpO2:  [97 %-100 %] 100 % (12/07 1115) FiO2 (%):  [30 %-35 %] 30 % (12/07 0833) HEMODYNAMICS:   VENTILATOR SETTINGS: Vent Mode:  [-] PSV;CPAP FiO2 (%):  [30 %-35 %] 30 % Set Rate:  [16 bmp] 16 bmp Vt Set:  [500 mL] 500 mL PEEP:  [5 cmH20] 5 cmH20 Pressure Support:  [12 cmH20-15 cmH20] 12 cmH20 Plateau Pressure:  [29 cmH20] 29 cmH20 INTAKE / OUTPUT:  Intake/Output Summary (Last 24 hours) at 10/26/15 1131 Last data filed at 10/26/15 1056  Gross per 24 hour  Intake   1555 ml  Output      0 ml  Net   1555 ml   PHYSICAL EXAMINATION: Physical Examination:   VS: BP 122/53 mmHg  Pulse 102  Temp(Src) 97.3 F (36.3 C) (Axillary)  Resp 28  Ht  (1.753 m)  Wt 213 lb 13.5 oz (97 kg)  BMI 31.57 kg/m2  SpO2  100%  LMP  (LMP Unknown)  General Appearance: No resp distress, coarse upper airway sounds, on vent Neuro: profoundly diffusely weak, alert today, sleeping but easily arousable HEENT: cushingoid facies, PERRLA, EOM intact Neck: trach site clean Pulmonary: clear anteriorly Cardiovascular: reg, + syst murmur Abdomen: soft, NT, +BS Extremities: warm, R>L UE edema    LABORATORY PANEL:   CBC  Recent Labs Lab 10/25/15 0500  WBC 8.0  HGB 7.6*  HCT 24.2*  PLT 195    Chemistries   Recent Labs Lab 10/24/15 2119 10/25/15 0500  NA 140 136  K 4.8 4.8  CL 99* 96*  CO2 35* 33*  GLUCOSE 132* 126*  BUN 71* 60*  CREATININE 1.10* 1.04*  CALCIUM 8.3* 8.2*  PHOS 3.9  --        IMPRESSION/PLAN (current major issues):  Recurrent sepsis, due to pneumonia.   Anemia with macrocytosis -Uncertain if this is due to chronic slow GI bleed or simple critical care associated anemia. She has already receiving folate, thiamine, and multivitamin. Her anemia has not responded to oral medications, this may be due to her GI malabsorption. Will also check Vit D levels (discussed with nephrology) - PTH, 25OH, 1,25OH Vit d  Pneumonia with sepsis-better. -Chest x-ray shows new left lower lobe infiltrate,which is now improving.  Sputum cultures show pseudomonas. The patient is once again, vent dependent. She has developed again recurrent sepsis.Marland Kitchen Sputum cultures showing Pseudomonas,  and Klebsiella, currently covered with this. Ceftaz. Duration to be determined by ID Weaning trial daily off MV - currently on PSV trials with plan to transition back to TCT  Worsening sacral decubitus ulcer, stage IV. -The patient is already had a sacral bone scraping, and has been placed on Septra for antibiotic, per ID recommendations. Her sacral decubitus does not appear to be improving and in fact appears to be worsening. In addition, she has several new smaller areas of tissue breakdown.  End-stage renal  disease. -Continue on hemodialysis per nephrology.  Diarrhea. -This has improved since changing her loperamide 2 twice daily scheduled, and changing her tube feeds back to Jevity. Continue loperamide to when necessary.  Patient appears to be stable for transfer to an LTAC facility at this point in time.   Additional Chronic issues and complicating factors during this admission.  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  Thrombocytopenia, resolved - HIT panel negative 9/25 Recurrent Severe sepsis, due to sacral pressure ulcer DM 2, controlled.  Chronic steroid therapy, for a history of of adrenal insufficiency Incidental finding of R hip fx - conservative mgmt. Waxing and waning encephalopathy - she was evaluated by both the psychiatry and neurology services, and it was deemed that the patient lacks decisional capacity at this time. 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 Husband declined TEE Profound deconditioning-criticall illness neuropathy Chronic pain   Husband updated regarding patients status.    Stephanie Acre, MD [facility] Pager [phone] (please enter 7-digits) On Call Pager - [phone] (please enter 7-digits)    Critical Care Attestation.  I have personally obtained a history, examined the patient, evaluated laboratory and imaging results, formulated the assessment and plan and placed orders. The case was discussed with the critical care RN, nutrition, respiratory therapist, Associate Professor. The Patient requires high complexity decision making for assessment and support, frequent evaluation and titration of therapies, application of advanced monitoring technologies and extensive interpretation of multiple databases. The patient has critical illness that could lead imminently to failure of 1 or more organ systems and requires the highest level of physician preparedness to  intervene.  Critical Care Time devoted to patient care services described in this note is 35 minutes and is exclusive of time spent in procedures.

## 2015-10-26 NOTE — Progress Notes (Signed)
ANTIBIOTIC CONSULT NOTE - Follow up  Pharmacy Consult for Ceftazidime  Indication: pneumonia  Allergies  Allergen Reactions  . Contrast Media [Iodinated Diagnostic Agents] Anaphylaxis  . Ampicillin Rash   Patient Measurements: Height:  (SCALE BROKE) Weight:  (Bedscale unavailable) IBW/kg (Calculated) : 66.2  Vital Signs: Temp: 96.4 F (35.8 C) (12/07 1315) Temp Source: Axillary (12/07 1315) BP: 135/95 mmHg (12/07 1315) Pulse Rate: 106 (12/07 1315) Intake/Output from previous day: 12/06 0701 - 12/07 0700 In: 1680 [I.V.:20; NG/GT:1610; IV Piggyback:50] Out: -    Recent Labs  10/24/15 2026 10/24/15 2119 10/25/15 0500 10/26/15 1116  WBC 7.4  --  8.0 9.0  HGB 7.6*  --  7.6* 8.0*  PLT 183  --  195 215  CREATININE  --  1.10* 1.04* 1.29*   Estimated Creatinine Clearance: 64.7 mL/min (by C-G formula based on Cr of 1.29).   Microbiology: Recent Results (from the past 720 hour(s))  C difficile quick scan w PCR reflex     Status: None   Collection Time: 10/07/15  2:02 PM  Result Value Ref Range Status   C Diff antigen NEGATIVE NEGATIVE Final   C Diff toxin NEGATIVE NEGATIVE Final   C Diff interpretation Negative for C. difficile  Final  Culture, blood (routine x 2)     Status: None   Collection Time: 10/16/15  9:52 AM  Result Value Ref Range Status   Specimen Description BLOOD LEFT HAND  Final   Special Requests   Final    BOTTLES DRAWN AEROBIC AND ANAEROBIC  AER 6CC ANA 4CC   Culture NO GROWTH 6 DAYS  Final   Report Status 10/22/2015 FINAL  Final  Culture, blood (routine x 2)     Status: None   Collection Time: 10/16/15 12:25 PM  Result Value Ref Range Status   Specimen Description BLOOD LEFT HAND  Final   Special Requests BOTTLES DRAWN AEROBIC AND ANAEROBIC  5CC  Final   Culture NO GROWTH 6 DAYS  Final   Report Status 10/22/2015 FINAL  Final  Culture, expectorated sputum-assessment     Status: None   Collection Time: 10/18/15  1:15 PM  Result Value Ref Range  Status   Specimen Description SPUTUM  Final   Special Requests NONE  Final   Sputum evaluation THIS SPECIMEN IS ACCEPTABLE FOR SPUTUM CULTURE  Final   Report Status 10/18/2015 FINAL  Final  Culture, respiratory (NON-Expectorated)     Status: None   Collection Time: 10/18/15  1:15 PM  Result Value Ref Range Status   Specimen Description SPUTUM  Final   Special Requests NONE Reflexed from [id number]  Final   Gram Stain   Final    GOOD SPECIMEN - 80-90% WBCS MODERATE WBC SEEN MANY GRAM NEGATIVE RODS    Culture   Final    HEAVY GROWTH PSEUDOMONAS AERUGINOSA MODERATE GROWTH KLEBSIELLA PNEUMONIAE CRITICAL RESULT CALLED TO, READ BACK BY AND VERIFIED WITHRod Mae AT 1610 10/21/15 DV KLEBSIELLA PNEUMONIAE This organism isolate is resistant to one or more antiotic agents in three or more antimicrobial categories.  Suggest Infectious Disease  consult.      Report Status 10/22/2015 FINAL  Final   Organism ID, Bacteria PSEUDOMONAS AERUGINOSA  Final   Organism ID, Bacteria KLEBSIELLA PNEUMONIAE  Final      Susceptibility   Klebsiella pneumoniae - MIC*    AMPICILLIN >=32 RESISTANT Resistant     CEFAZOLIN >=64 RESISTANT Resistant     CEFTRIAXONE >=64 RESISTANT Resistant  CIPROFLOXACIN >=4 RESISTANT Resistant     GENTAMICIN >=16 RESISTANT Resistant     IMIPENEM >=16 RESISTANT Resistant     NITROFURANTOIN >=512 RESISTANT Resistant     TRIMETH/SULFA <=20 SENSITIVE Sensitive     * MODERATE GROWTH KLEBSIELLA PNEUMONIAE   Pseudomonas aeruginosa - MIC*    CEFTAZIDIME 4 SENSITIVE Sensitive     CIPROFLOXACIN >=4 RESISTANT Resistant     GENTAMICIN 8 INTERMEDIATE Intermediate     IMIPENEM >=16 RESISTANT Resistant     PIP/TAZO Value in next row Sensitive      SENSITIVE32    * HEAVY GROWTH PSEUDOMONAS AERUGINOSA  C difficile quick scan w PCR reflex     Status: None   Collection Time: 10/18/15  4:39 PM  Result Value Ref Range Status   C Diff antigen NEGATIVE NEGATIVE Final   C Diff toxin  NEGATIVE NEGATIVE Final   C Diff interpretation Negative for C. difficile  Final   Assessment: 49 yo female with ESRD receiving HD every MWF. Pharmacy consulted for dosing and monitoring of ceftazidime.   Patients sputum cultures showing heavy growth of Pseudomonas and Klebsiella.  Suspectibilites show Pseudomonas is sensitive to Ceftazidime and Klebsiella is sensitive to Septra which is currently on board as well.   Plan:  Will continue patient on Ceftazidime 1g every 24 hours for HD dosing.  Pharmacy will continue to monitor labs.  Luisa HartScott Luman Holway, PharmD   10/26/2015 3:07 PM

## 2015-10-26 NOTE — Progress Notes (Signed)
HD Tx Initiation 

## 2015-10-27 LAB — GLUCOSE, CAPILLARY
GLUCOSE-CAPILLARY: 125 mg/dL — AB (ref 65–99)
GLUCOSE-CAPILLARY: 73 mg/dL (ref 65–99)
Glucose-Capillary: 127 mg/dL — ABNORMAL HIGH (ref 65–99)
Glucose-Capillary: 87 mg/dL (ref 65–99)
Glucose-Capillary: 94 mg/dL (ref 65–99)
Glucose-Capillary: 85 mg/dL (ref 65–99)

## 2015-10-27 MED ORDER — DEXTROSE 5 % IV SOLN
1.0000 g | INTRAVENOUS | Status: AC
  Administered 2015-10-27 – 2015-10-30 (×4): 1 g via INTRAVENOUS
  Filled 2015-10-27 (×4): qty 1

## 2015-10-27 MED ORDER — DAKINS (1/4 STRENGTH) 0.125 % EX SOLN
Freq: Two times a day (BID) | CUTANEOUS | Status: AC
  Administered 2015-10-27 – 2015-10-28 (×3)
  Administered 2015-10-28: 1
  Administered 2015-10-29 (×2)
  Filled 2015-10-27 (×3): qty 473

## 2015-10-27 MED ORDER — IPRATROPIUM-ALBUTEROL 0.5-2.5 (3) MG/3ML IN SOLN: 3.0000 mL | RESPIRATORY_TRACT | Status: DC | PRN

## 2015-10-27 NOTE — Progress Notes (Addendum)
[facility] [place] Critical Care Medicine Progess Note  Name: Courtney [name] MRN: [id number] DOB: 11-Dec-1965    ADMISSION DATE:  08/26/2015   INITIAL PRESENTATION:  49 AAF who has been in medical facilities (hosp, LTAC, rehab) for 2 yrs following gastric bypass surgery with multiple complications. Now with chronic trach, ESRD, profound debilitation, severe sacral pressure ulcer. Was seemingly making progress and transferred to rehab facility approx one week prior to this admission. She was sent to [facility] ED with AMS and hypotension. Working dx of severe sepsis/septic shock due to infected sacral pressure ulcer. Since admission. Her course is been very complicated with numerous complications including septic shock and GI bleeding.    INTERVAL HISTORY: Patient had 1L of fluid removed during HD yesterday, this morning placed on PSV 12/5, noted to have tachypnea, with whistling sound from trach site. RT inspected trach site, deflatted cuff and reinflatted cuff, not loss of pressure, no leak.  Patient placed back on PRVC, given morphine.   Summary of MAJOR EVENTS/TEST RESULTS: Admission 02/07/14-05/07/14 Admission 07/21/14-09/06/14 Discharged to [facility]. Pt had palliative consult at that time, were asked to sign off by husband.  09/23 CT head: NAD 09/23 EEG: no epileptiform activity 09/23 PRBCs for Hgb 6.4 09/24 bedside debridement of sacral wound. Abscess drained 09/25 Off vasopressors. More alert. No distress. Worsening thrombocytopenia. Vanc DC'd 09/29  Dr. Sampson Goon (I.D) excused from the case by patient's husband. 10/02 MRI -multiple infarcts 10/03 tracheal bleeding- transfused platelets 10/03 hospitalist service excused from the case by patient's husband 10/03 Echocardiogram ejection fraction was 55-60%, pulmonary systolic pressure was 39 mmHg 40/98 restart TF's at lower rate, attempt reg HD  10/12 Transferred to med-surg floor. Remains on PCCM service 10/14 SLP eval: pt unable to  tolerate PMV adequately 10/17-will re-attempt PM valve-discussed with Speech therapist 10/18 passed swallow eval-start pureed thick foods no thin liquids-continue NG feeds 10/19 transferred to step down for sepsis/aspiration pneumonia 10/19 cxr shows RLL opacity 10/21 sacral decub debride at bedside by surgery 10/22 started back on vasopressors while on HD 10/27 CT with osteo, R hip fx, unable to identify tip of dubhoff tube - sent for fluoro study 10/28 Ortho consultation: I do not feel that she is a surgical candidate. Therefore, I feel that it would best to manage this fracture nonsurgically and allow it to heal by itself over time, which it should.  10/28 Gen Surg consultation: Due to the lack of free air or free flow of contrast and the peritoneum there is no indication for any surgical intervention on this. Would recommend pulling back the feeding tube 1-2 cm. Would recheck an abdominal film to confirm no pneumoperitoneum in the morning. Absence of any changes okay to continue using Dobbhoff for feeding and medications. 10/28 gastrograffin study: The study confirms that the feeding tube pes perforated through the duodenum and the tip is within a cavity that fills with injected contrast. The cavity does appear walled off 11/4 refuses oral feeds, continue TF's CT reviewed: T8-T9 discitis/osteomyelitis, RT HIP FRACTURE 11/5 placed back on vasopressors, placed back on Vent due to resp acidosis, levophed turned off 11/6 afternoon 11/5 PM Trach changed out due to cuff leak, #6 Shiley cuffed, 11/6 dubhoff occluded, removed and replaced, new dubhoff shows tube in the antral stomach 11/15 refusing to take oral feeds 11/18 placed back on Vent for increased WOB,SOB. 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 10/18/2015; the patient developed  a new left basal pneumonia; with recurrent sepsis.  10/19/2015; fevers resolved with  IV acetaminophen. Tube feeds changed from vital to Jevity.  12/7 CXR with stable b/l opacities.    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 Pneumoniae, P. Mirabilis, VRE Wound 9/26 >> No growth Blood 9/23 >> NEG CDiff 9/27>>neg Stool Cx 10/15>> negative Cdiff 10/25>>neg Trach Aspirate 10/28>> light growth pseudomonas Blood 10/28 >> 1/2 GPC >>  Sputum cultures obtained 09/22/15 due to mucus plugging>>Pseudomonas BONE TISSUE Cx 11/3>>E.coli (ESBL), k. Pneumonia (daptomycin and septra) Bld Cx 11/27>>negative thus far.  Trach Asp 11/27>> grew out Pseudomonas, and Klebsiella pneumonia.  ANTIMICROBIALS:  Aztreonam 9/23 >> 9/24 Vanc 9/23 >> 9/25 Vanc 9/26>>9/27 Daptomycin 9/27>> 10/12, 10/28>> off Meropenem 9/23 >> 10/14, 10/28 >> 10/31 Septra 11/8>> Zosyn 11/27,>> 11/30. Levaquin 11/29>> 11/29. Ceftaz 12/1>>  VITAL SIGNS: Temp:  [96.4 F (35.8 C)-98.7 F (37.1 C)] 97.5 F (36.4 C) (12/08 0800) Pulse Rate:  [100-110] 106 (12/08 0400) Resp:  [8-38] 32 (12/08 1000) BP: (89-135)/(38-95) 115/68 mmHg (12/08 1000) SpO2:  [94 %-100 %] 99 % (12/08 1211) FiO2 (%):  [30 %] 30 % (12/08 1211) HEMODYNAMICS:   VENTILATOR SETTINGS: Vent Mode:  [-] PRVC FiO2 (%):  [30 %] 30 % Set Rate:  [16 bmp] 16 bmp Vt Set:  [500 mL] 500 mL PEEP:  [5 cmH20] 5 cmH20 Pressure Support:  [12 cmH20] 12 cmH20 INTAKE / OUTPUT:  Intake/Output Summary (Last 24 hours) at 10/27/15 1252 Last data filed at 10/26/15 2100  Gross per 24 hour  Intake    830 ml  Output   1000 ml  Net   -170 ml   PHYSICAL EXAMINATION: Physical Examination:   VS: BP  115/68 mmHg  Pulse 106  Temp(Src) 97.5 F (36.4 C) (Oral)  Resp 32  Ht 5\' 9"  (1.753 m)  Wt 213 lb 13.5 oz (97 kg)  BMI 31.57 kg/m2  SpO2 99%  LMP  (LMP Unknown)  General Appearance: No resp distress, coarse upper airway sounds, on vent Neuro: profoundly diffusely weak, alert today, sleeping but easily arousable HEENT: cushingoid facies, PERRLA, EOM intact Neck: trach site clean Pulmonary: clear anteriorly Cardiovascular: reg, + syst murmur Abdomen: soft, NT, +BS Extremities: warm, R>L UE edema    LABORATORY PANEL:   CBC  Recent Labs Lab 10/26/15 1116  WBC 9.0  HGB 8.0*  HCT 25.8*  PLT 215    Chemistries   Recent Labs Lab 10/26/15 1116  NA 139  K 5.6*  CL 97*  CO2 33*  GLUCOSE 104*  BUN 83*  CREATININE 1.29*  CALCIUM 8.8*  PHOS 4.5       IMPRESSION/PLAN (current major issues):  Recurrent sepsis, due to pneumonia.   Anemia with macrocytosis -Uncertain if this is due to chronic slow GI bleed or simple critical care associated anemia. She has already receiving folate, thiamine, and multivitamin. Her anemia has not responded to oral medications, this may be due to her GI malabsorption. Will also check Vit D levels (discussed with nephrology) - PTH, 25OH, 1,25OH Vit d  Pneumonia with sepsis-better. -Chest x-ray shows new left lower lobe infiltrate,which is now improving.  Sputum cultures show pseudomonas. The  patient is once again, vent dependent. She has developed again recurrent sepsis.Marland Kitchen Sputum cultures showing Pseudomonas, and Klebsiella, currently covered with this. Ceftaz total 10 days, stop date 12/11 Weaning trial daily off MV - currently on PSV trials with plan to transition back to TCT  Worsening sacral decubitus ulcer, stage IV. -The patient is already had a sacral bone scraping, and has been placed on Septra for antibiotic, per ID recommendations. Her sacral decubitus does not appear to be improving and in fact appears to be worsening. In  addition, she has several new smaller areas of tissue breakdown.  End-stage renal disease. -Continue on hemodialysis per nephrology.  Diarrhea. -This has improved since changing her loperamide 2 twice daily scheduled, and changing her tube feeds back to Jevity. Continue loperamide to when necessary.   Trach Care -changed on 11/5 to #6 Shiley due to cuff leak   Patient appears to be stable for transfer to an LTAC facility at this point in time.   Additional Chronic issues and complicating factors during this admission.  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  Thrombocytopenia, resolved - HIT panel negative 9/25 Recurrent Severe sepsis, due to sacral pressure ulcer DM 2, controlled.  Chronic steroid therapy, for a history of of adrenal insufficiency Incidental finding of R hip fx - conservative mgmt. Waxing and waning encephalopathy - she was evaluated by both the psychiatry and neurology services, and it was deemed that the patient lacks decisional capacity at this time. 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 Husband declined TEE Profound deconditioning-criticall illness neuropathy Chronic pain   LM on husband VM to call us for updates.  Stephanie Acre, MD [facility] Pager [phone] (please enter 7-digits) On Call Pager - [phone] (please enter 7-digits)    Critical Care Attestation.  I have personally obtained a history, examined the patient, evaluated laboratory and imaging results, formulated the assessment and plan and placed orders. The case was discussed with the critical care RN, nutrition, respiratory therapist, Associate Professor. The Patient requires high complexity decision making for assessment and support, frequent evaluation and titration of therapies, application of advanced monitoring technologies and extensive interpretation of multiple databases. The patient has  critical illness that could lead imminently to failure of 1 or more organ systems and requires the highest level of physician preparedness to intervene.  Critical Care Time devoted to patient care services described in this note is 35 minutes and is exclusive of time spent in procedures.

## 2015-10-27 NOTE — Progress Notes (Signed)
Notified Dr. Dema SeverinMungal of pt working to breathe. RR 33 using accessory muscles. He was also made aware of snoring sound coming from trach.Pt suctioned and repositioned.  Respiratory came to assess pt as well, and checked cuff pressure. No leak per respiratory therapists. New order to put pt back on PRVC mode and give 4mg  of morphine. Will continue to monitor.

## 2015-10-27 NOTE — Progress Notes (Signed)
MEDICATION RELATED CONSULT NOTE - Follow up   Pharmacy Consult for Renal dosing  Allergies  Allergen Reactions  . Contrast Media [Iodinated Diagnostic Agents] Anaphylaxis  . Ampicillin Rash    Patient Measurements: Height:  (SCALE BROKE) Weight:  (bed scale broken ) IBW/kg (Calculated) : 66.2  Vital Signs: Temp: 97.5 F (36.4 C) (12/08 0800) Temp Source: Oral (12/08 0800) BP: 115/68 mmHg (12/08 1000) Pulse Rate: 106 (12/08 0400)  Recent Labs  10/24/15 2026 10/24/15 2119 10/25/15 0500 10/26/15 1116  WBC 7.4  --  8.0 9.0  HGB 7.6*  --  7.6* 8.0*  HCT 24.9*  --  24.2* 25.8*  PLT 183  --  195 215  CREATININE  --  1.10* 1.04* 1.29*  PHOS  --  3.9  --  4.5  ALBUMIN  --  1.9*  --  2.2*   Estimated Creatinine Clearance: 64.7 mL/min (by C-G formula based on Cr of 1.29).   Assessment: 49 yo patient on HD. Pharmacy consulted for renal dosing of medications.   Plan:  No medications require adjustment at present. Pharmacy will continue to monitor patients medications and dose adjust as needed.    Luisa HartScott Marlaine Arey, PharmD  10/27/2015

## 2015-10-27 NOTE — Progress Notes (Signed)
ANTIBIOTIC CONSULT NOTE - Follow up  Pharmacy Consult for Ceftazidime  Indication: pneumonia  Allergies  Allergen Reactions  . Contrast Media [Iodinated Diagnostic Agents] Anaphylaxis  . Ampicillin Rash   Patient Measurements: Height:  (SCALE BROKE) Weight:  (bed scale broken ) IBW/kg (Calculated) : 66.2  Vital Signs: Temp: 97.5 F (36.4 C) (12/08 0800) Temp Source: Oral (12/08 0800) BP: 115/68 mmHg (12/08 1000) Pulse Rate: 106 (12/08 0400) Intake/Output from previous day: 12/07 0701 - 12/08 0700 In: 1310 [I.V.:20; NG/GT:1290] Out: 1000    Recent Labs  10/24/15 2026 10/24/15 2119 10/25/15 0500 10/26/15 1116  WBC 7.4  --  8.0 9.0  HGB 7.6*  --  7.6* 8.0*  PLT 183  --  195 215  CREATININE  --  1.10* 1.04* 1.29*   Estimated Creatinine Clearance: 64.7 mL/min (by C-G formula based on Cr of 1.29).   Microbiology: Recent Results (from the past 720 hour(s))  C difficile quick scan w PCR reflex     Status: None   Collection Time: 10/07/15  2:02 PM  Result Value Ref Range Status   C Diff antigen NEGATIVE NEGATIVE Final   C Diff toxin NEGATIVE NEGATIVE Final   C Diff interpretation Negative for C. difficile  Final  Culture, blood (routine x 2)     Status: None   Collection Time: 10/16/15  9:52 AM  Result Value Ref Range Status   Specimen Description BLOOD LEFT HAND  Final   Special Requests   Final    BOTTLES DRAWN AEROBIC AND ANAEROBIC  AER 6CC ANA 4CC   Culture NO GROWTH 6 DAYS  Final   Report Status 10/22/2015 FINAL  Final  Culture, blood (routine x 2)     Status: None   Collection Time: 10/16/15 12:25 PM  Result Value Ref Range Status   Specimen Description BLOOD LEFT HAND  Final   Special Requests BOTTLES DRAWN AEROBIC AND ANAEROBIC  5CC  Final   Culture NO GROWTH 6 DAYS  Final   Report Status 10/22/2015 FINAL  Final  Culture, expectorated sputum-assessment     Status: None   Collection Time: 10/18/15  1:15 PM  Result Value Ref Range Status   Specimen  Description SPUTUM  Final   Special Requests NONE  Final   Sputum evaluation THIS SPECIMEN IS ACCEPTABLE FOR SPUTUM CULTURE  Final   Report Status 10/18/2015 FINAL  Final  Culture, respiratory (NON-Expectorated)     Status: None   Collection Time: 10/18/15  1:15 PM  Result Value Ref Range Status   Specimen Description SPUTUM  Final   Special Requests NONE Reflexed from [id number]  Final   Gram Stain   Final    GOOD SPECIMEN - 80-90% WBCS MODERATE WBC SEEN MANY GRAM NEGATIVE RODS    Culture   Final    HEAVY GROWTH PSEUDOMONAS AERUGINOSA MODERATE GROWTH KLEBSIELLA PNEUMONIAE CRITICAL RESULT CALLED TO, READ BACK BY AND VERIFIED WITHRod Mae: JOANNA LINDSAY AT [phone] 10/21/15 DV KLEBSIELLA PNEUMONIAE This organism isolate is resistant to one or more antiotic agents in three or more antimicrobial categories.  Suggest Infectious Disease  consult.      Report Status 10/22/2015 FINAL  Final   Organism ID, Bacteria PSEUDOMONAS AERUGINOSA  Final   Organism ID, Bacteria KLEBSIELLA PNEUMONIAE  Final      Susceptibility   Klebsiella pneumoniae - MIC*    AMPICILLIN >=32 RESISTANT Resistant     CEFAZOLIN >=64 RESISTANT Resistant     CEFTRIAXONE >=64 RESISTANT Resistant  CIPROFLOXACIN >=4 RESISTANT Resistant     GENTAMICIN >=16 RESISTANT Resistant     IMIPENEM >=16 RESISTANT Resistant     NITROFURANTOIN >=512 RESISTANT Resistant     TRIMETH/SULFA <=20 SENSITIVE Sensitive     * MODERATE GROWTH KLEBSIELLA PNEUMONIAE   Pseudomonas aeruginosa - MIC*    CEFTAZIDIME 4 SENSITIVE Sensitive     CIPROFLOXACIN >=4 RESISTANT Resistant     GENTAMICIN 8 INTERMEDIATE Intermediate     IMIPENEM >=16 RESISTANT Resistant     PIP/TAZO Value in next row Sensitive      SENSITIVE32    * HEAVY GROWTH PSEUDOMONAS AERUGINOSA  C difficile quick scan w PCR reflex     Status: None   Collection Time: 10/18/15  4:39 PM  Result Value Ref Range Status   C Diff antigen NEGATIVE NEGATIVE Final   C Diff toxin NEGATIVE NEGATIVE Final    C Diff interpretation Negative for C. difficile  Final   Assessment: 49 yo female with ESRD receiving HD every MWF. Pharmacy consulted for dosing and monitoring of ceftazidime.   Patients sputum cultures showing heavy growth of Pseudomonas and Klebsiella.  Suspectibilites show Pseudomonas is sensitive to Ceftazidime and Klebsiella is sensitive to Septra which is currently on board as well.   Plan:  Will continue patient on Ceftazidime 1g every 24 hours for HD dosing.  Pharmacy will continue to monitor labs.  Luisa Hart, PharmD   10/27/2015 3:02 PM

## 2015-10-27 NOTE — Progress Notes (Signed)
[name] is alert. Remains on ventilator PRVC mode. Tube feeds changed but remains at same rate. Trach care performed. Moderate drainage. VSS. Wound care performed and sacral dressing changed as ordered. Afebrile. Plan for dialysis tomorrow.

## 2015-10-27 NOTE — Progress Notes (Signed)
PT Cancellation Note  Patient Details Name: Courtney GrinderHedda McMillian Monroe MRN: [id number] DOB: 10-Jul-1966   Cancelled Treatment:    Reason Eval/Treat Not Completed: Medical issues which prohibited therapy (Per discussion with primary RN, patient placed back on rate settings on vent due to increased WOB; issued morphine for comfort.  Recommends hold at this time to ensure stability of respiratory system prior to attempts at mobility.)  Denver Bentson H. Manson PasseyBrown, PT, DPT, NCS 10/27/2015, 11:45 AM [phone]

## 2015-10-27 NOTE — Consult Note (Signed)
WOC wound follow up Wound type:Chronic Stage IV pressure ulcer Ongoing WOC evals.  Last evaluation, was switched back to Dakin's solution for debridement due to increase in necrotic tissue in sacral wound.  This appears improved today.  Patient has loose stool today.  Measurement: sacrum 20 cm x 15 cm x 4 cm with palpable bone in wound bed. 75% pale pink tissue and 10% muscle/tendon. Wound has decreased adherent yellow slough in wound bed. Undermining still present from 8 to 12 o'clock, extending 1 cm.  Right lateral foot 100% slough with detaching eschar. Some autolysis occuring. Right trochanter 3.6 cm x 1 cm x 0.1 cm 10% devitalized tissue 90% pale pink tissue.  Patient continues to have increasing loose stools.  Drainage (amount, consistency, odor) Moderate serosanguinous drainage. Musty odor.  Periwound:Intact Dressing procedure/placement/frequency:Cleanse right foot with NS and apply silicone foam dressing.Change every 3 days.  Cleanse right hip with NS and pat gently dry. Apply Allevyn silicone border foam. Change every 3 days and PRn soilage Cleanse sacral ulcer with NS and pat gently dry. Gently fill wound bed with Dakin's moistened gauze. Cover with 4x4 gauze and ABD pad. Change each shift and PRN soilage.  Will not follow at this time. Please re-consult if needed.  Maple HudsonKaren Sha Amer RN BSN CWON Pager [phone]    Measurement: Wound bed: Drainage (amount, consistency, odor)  Periwound: Dressing procedure/placement/frequency: Conservative sharp wound debridement (CSWD performed at the bedside):

## 2015-10-27 NOTE — Progress Notes (Signed)
Subjective:   Hemodialysis yesterday. Tolerated treatment well. UF of 1 liter. 3K bath for K of 5.7  Objective:  Vital signs in last 24 hours:  Temp:  [96.4 F (35.8 C)-98.7 F (37.1 C)] 98.7 F (37.1 C) (12/08 0400) Pulse Rate:  [97-110] 106 (12/08 0400) Resp:  [8-38] 31 (12/08 0800) BP: (72-135)/(32-95) 115/46 mmHg (12/08 0800) SpO2:  [94 %-100 %] 100 % (12/08 0425) FiO2 (%):  [30 %] 30 % (12/08 0700)  Weight change:  Filed Weights    Intake/Output: I/O last 3 completed shifts: In: 2090 [I.V.:20; NG/GT:2070] Out: 1000 [Other:1000]   Intake/Output this shift:     Physical Exam: General: critically ill appearing  Head: NG in place, OM moist  Eyes: anicteric  Neck: Tracheostomy in place  Lungs:  Scattered rhonchi, vent assisted fio2 35%,   Heart: S1S2 no rubs  Abdomen:  Soft, nontender  Extremities: 1+ dependent edema, anasarca  Neurologic: Awake, not consistently following commands  Access:  R IJ permcath.       Basic Metabolic Panel:  Recent Labs Lab 10/21/15 1720 10/24/15 2119 10/25/15 0500 10/26/15 1116  NA 138 140 136 139  K 4.8 4.8 4.8 5.6*  CL 97* 99* 96* 97*  CO2 33* 35* 33* 33*  GLUCOSE 139* 132* 126* 104*  BUN 69* 71* 60* 83*  CREATININE 1.06* 1.10* 1.04* 1.29*  CALCIUM 8.3* 8.3* 8.2* 8.8*  PHOS 3.5  3.6 3.9  --  4.5    Liver Function Tests:  Recent Labs Lab 10/21/15 1720 10/24/15 2119 10/26/15 1116  ALBUMIN 2.0* 1.9* 2.2*   No results for input(s): LIPASE, AMYLASE in the last 168 hours. No results for input(s): AMMONIA in the last 168 hours.  CBC:  Recent Labs Lab 10/21/15 1720 10/21/15 2301 10/22/15 0457 10/24/15 2026 10/25/15 0500 10/26/15 1116  WBC 11.4*  --  11.9* 7.4 8.0 9.0  HGB 6.7* 8.2* 7.8* 7.6* 7.6* 8.0*  HCT 22.2* 26.1* 25.2* 24.9* 24.2* 25.8*  MCV 96.7  --  95.3 97.3 95.8 96.1  PLT 172  --  170 183 195 215    Cardiac Enzymes: No results for input(s): CKTOTAL, CKMB, CKMBINDEX, TROPONINI in the last 168  hours.  BNP: Invalid input(s): POCBNP  CBG:  Recent Labs Lab 10/26/15 1115 10/26/15 1609 10/26/15 2359 10/27/15 0354 10/27/15 0743  GLUCAP 111* 157* 105* 125* 73    Microbiology: Results for orders placed or performed during the hospital encounter of 08/01/2015  Blood Culture (routine x 2)     Status: None   Collection Time: 07/21/2015  8:51 AM  Result Value Ref Range Status   Specimen Description BLOOD [name]  Final   Special Requests BOTTLES DRAWN AEROBIC AND ANAEROBIC  3CC  Final   Culture NO GROWTH 5 DAYS  Final   Report Status 08/17/2015 FINAL  Final  Blood Culture (routine x 2)     Status: None   Collection Time: 08/04/2015  9:20 AM  Result Value Ref Range Status   Specimen Description BLOOD LEFT ARM  Final   Special Requests BOTTLES DRAWN AEROBIC AND ANAEROBIC  1CC  Final   Culture NO GROWTH 5 DAYS  Final   Report Status 08/17/2015 FINAL  Final  Wound culture     Status: None   Collection Time: 08/14/2015  9:20 AM  Result Value Ref Range Status   Specimen Description DECUBITIS  Final   Special Requests Normal  Final   Gram Stain   Final    FEW WBC SEEN  MANY GRAM NEGATIVE RODS RARE GRAM POSITIVE COCCI    Culture   Final    HEAVY GROWTH ESCHERICHIA COLI MODERATE GROWTH ENTEROBACTER AEROGENES PROTEUS MIRABILIS HEAVY GROWTH ENTEROCOCCUS SPECIES VRE HAVE INTRINSIC RESISTANCE TO MOST COMMONLY USED ANTIBIOTICS AND THE ABILITY TO ACQUIRE RESISTANCE TO MOST AVAILABLE ANTIBIOTICS.    Report Status 08/16/2015 FINAL  Final   Organism ID, Bacteria ESCHERICHIA COLI  Final   Organism ID, Bacteria ENTEROBACTER AEROGENES  Final   Organism ID, Bacteria PROTEUS MIRABILIS  Final   Organism ID, Bacteria ENTEROCOCCUS SPECIES  Final      Susceptibility   Enterobacter aerogenes - MIC*    CEFTAZIDIME <=1 SENSITIVE Sensitive     CEFAZOLIN >=64 RESISTANT Resistant     CEFTRIAXONE <=1 SENSITIVE Sensitive     CIPROFLOXACIN <=0.25 SENSITIVE Sensitive     GENTAMICIN <=1 SENSITIVE Sensitive      IMIPENEM 1 SENSITIVE Sensitive     TRIMETH/SULFA <=20 SENSITIVE Sensitive     * MODERATE GROWTH ENTEROBACTER AEROGENES   Escherichia coli - MIC*    AMPICILLIN <=2 SENSITIVE Sensitive     CEFTAZIDIME <=1 SENSITIVE Sensitive     CEFAZOLIN <=4 SENSITIVE Sensitive     CEFTRIAXONE <=1 SENSITIVE Sensitive     CIPROFLOXACIN <=0.25 SENSITIVE Sensitive     GENTAMICIN <=1 SENSITIVE Sensitive     IMIPENEM <=0.25 SENSITIVE Sensitive     TRIMETH/SULFA <=20 SENSITIVE Sensitive     * HEAVY GROWTH ESCHERICHIA COLI   Proteus mirabilis - MIC*    AMPICILLIN >=32 RESISTANT Resistant     CEFTAZIDIME <=1 SENSITIVE Sensitive     CEFAZOLIN 8 SENSITIVE Sensitive     CEFTRIAXONE <=1 SENSITIVE Sensitive     CIPROFLOXACIN <=0.25 SENSITIVE Sensitive     GENTAMICIN <=1 SENSITIVE Sensitive     IMIPENEM 1 SENSITIVE Sensitive     TRIMETH/SULFA <=20 SENSITIVE Sensitive     * PROTEUS MIRABILIS   Enterococcus species - MIC*    AMPICILLIN >=32 RESISTANT Resistant     VANCOMYCIN >=32 RESISTANT Resistant     GENTAMICIN SYNERGY SENSITIVE Sensitive     TETRACYCLINE Value in next row Resistant      RESISTANT>=16    * HEAVY GROWTH ENTEROCOCCUS SPECIES  MRSA PCR Screening     Status: None   Collection Time: 07/25/2015  2:38 PM  Result Value Ref Range Status   MRSA by PCR NEGATIVE NEGATIVE Final    Comment:        The GeneXpert MRSA Assay (FDA approved for NASAL specimens only), is one component of a comprehensive MRSA colonization surveillance program. It is not intended to diagnose MRSA infection nor to guide or monitor treatment for MRSA infections.   Blood culture (single)     Status: None   Collection Time: 08/09/2015  3:36 PM  Result Value Ref Range Status   Specimen Description BLOOD RIGHT ASSIST CONTROL  Final   Special Requests BOTTLES DRAWN AEROBIC AND ANAEROBIC 5ML  Final   Culture NO GROWTH 5 DAYS  Final   Report Status 08/17/2015 FINAL  Final  Wound culture     Status: None   Collection Time:  08/13/15 12:37 PM  Result Value Ref Range Status   Specimen Description WOUND  Final   Special Requests Normal  Final   Gram Stain   Final    FEW WBC SEEN TOO NUMEROUS TO COUNT GRAM NEGATIVE RODS FEW GRAM POSITIVE COCCI    Culture   Final    HEAVY GROWTH ESCHERICHIA COLI MODERATE  GROWTH PROTEUS MIRABILIS LIGHT GROWTH KLEBSIELLA PNEUMONIAE MODERATE GROWTH ENTEROCOCCUS GALLINARUM CRITICAL RESULT CALLED TO, READ BACK BY AND VERIFIED WITH: [facility] BORBA AT 1042 08/16/15 DV    Report Status 08/17/2015 FINAL  Final   Organism ID, Bacteria ESCHERICHIA COLI  Final   Organism ID, Bacteria PROTEUS MIRABILIS  Final   Organism ID, Bacteria KLEBSIELLA PNEUMONIAE  Final   Organism ID, Bacteria ENTEROCOCCUS GALLINARUM  Final      Susceptibility   Escherichia coli - MIC*    AMPICILLIN >=32 RESISTANT Resistant     CEFTAZIDIME 4 RESISTANT Resistant     CEFAZOLIN >=64 RESISTANT Resistant     CEFTRIAXONE 16 RESISTANT Resistant     GENTAMICIN 2 SENSITIVE Sensitive     IMIPENEM >=16 RESISTANT Resistant     TRIMETH/SULFA <=20 SENSITIVE Sensitive     Extended ESBL POSITIVE Resistant     PIP/TAZO Value in next row Resistant      RESISTANT>=128    CIPROFLOXACIN Value in next row Sensitive      SENSITIVE<=0.25    * HEAVY GROWTH ESCHERICHIA COLI   Klebsiella pneumoniae - MIC*    AMPICILLIN Value in next row Resistant      SENSITIVE<=0.25    CEFTAZIDIME Value in next row Resistant      SENSITIVE<=0.25    CEFAZOLIN Value in next row Resistant      SENSITIVE<=0.25    CEFTRIAXONE Value in next row Resistant      SENSITIVE<=0.25    CIPROFLOXACIN Value in next row Resistant      SENSITIVE<=0.25    GENTAMICIN Value in next row Sensitive      SENSITIVE<=0.25    IMIPENEM Value in next row Resistant      SENSITIVE<=0.25    TRIMETH/SULFA Value in next row Resistant      SENSITIVE<=0.25    PIP/TAZO Value in next row Resistant      RESISTANT>=128    * LIGHT GROWTH KLEBSIELLA PNEUMONIAE   Proteus  mirabilis - MIC*    AMPICILLIN Value in next row Resistant      RESISTANT>=128    CEFTAZIDIME Value in next row Sensitive      RESISTANT>=128    CEFAZOLIN Value in next row Sensitive      RESISTANT>=128    CEFTRIAXONE Value in next row Sensitive      RESISTANT>=128    CIPROFLOXACIN Value in next row Sensitive      RESISTANT>=128    GENTAMICIN Value in next row Sensitive      RESISTANT>=128    IMIPENEM Value in next row Sensitive      RESISTANT>=128    TRIMETH/SULFA Value in next row Sensitive      RESISTANT>=128    PIP/TAZO Value in next row Sensitive      SENSITIVE<=4    * MODERATE GROWTH PROTEUS MIRABILIS   Enterococcus gallinarum - MIC*    AMPICILLIN Value in next row Resistant      SENSITIVE<=4    GENTAMICIN SYNERGY Value in next row Sensitive      SENSITIVE<=4    CIPROFLOXACIN Value in next row Resistant      RESISTANT>=8    TETRACYCLINE Value in next row Resistant      RESISTANT>=16    * MODERATE GROWTH ENTEROCOCCUS GALLINARUM  Wound culture     Status: None   Collection Time: 08/15/15  2:53 PM  Result Value Ref Range Status   Specimen Description WOUND  Final   Special Requests NONE  Final   Gram  Stain FEW WBC SEEN NO ORGANISMS SEEN   Final   Culture NO GROWTH 3 DAYS  Final   Report Status 08/18/2015 FINAL  Final  C difficile quick scan w PCR reflex     Status: None   Collection Time: 08/17/15 11:34 AM  Result Value Ref Range Status   C Diff antigen NEGATIVE NEGATIVE Final   C Diff toxin NEGATIVE NEGATIVE Final   C Diff interpretation Negative for C. difficile  Final  Stool culture     Status: None   Collection Time: 09/03/15  3:51 PM  Result Value Ref Range Status   Specimen Description STOOL  Final   Special Requests Immunocompromised  Final   Culture   Final    NO SALMONELLA OR SHIGELLA ISOLATED No Pathogenic E. coli detected NO CAMPYLOBACTER DETECTED    Report Status 09/07/2015 FINAL  Final  C difficile quick scan w PCR reflex     Status: None    Collection Time: 09/13/15 12:51 AM  Result Value Ref Range Status   C Diff antigen NEGATIVE NEGATIVE Final   C Diff toxin NEGATIVE NEGATIVE Final   C Diff interpretation Negative for C. difficile  Final  Culture, blood (routine x 2)     Status: None   Collection Time: 09/16/15 11:24 AM  Result Value Ref Range Status   Specimen Description BLOOD LEFT HAND  Final   Special Requests BOTTLES DRAWN AEROBIC AND ANAEROBIC  1CC  Final   Culture NO GROWTH 5 DAYS  Final   Report Status 09/21/2015 FINAL  Final  Culture, blood (routine x 2)     Status: None   Collection Time: 09/16/15 12:23 PM  Result Value Ref Range Status   Specimen Description BLOOD RIGHT HAND  Final   Special Requests BOTTLES DRAWN AEROBIC AND ANAEROBIC  1CC  Final   Culture  Setup Time   Final    GRAM POSITIVE COCCI AEROBIC BOTTLE ONLY CRITICAL RESULT CALLED TO, READ BACK BY AND VERIFIED WITH: TESS THOMAS,RN 09/17/2015 0631 BY JRS.    Culture   Final    ENTEROCOCCUS FAECALIS AEROBIC BOTTLE ONLY VRE HAVE INTRINSIC RESISTANCE TO MOST COMMONLY USED ANTIBIOTICS AND THE ABILITY TO ACQUIRE RESISTANCE TO MOST AVAILABLE ANTIBIOTICS. CRITICAL RESULT CALLED TO, READ BACK BY AND VERIFIED WITH: CHERYL SMITH AT 1610 09/19/15 DV    Report Status 09/21/2015 FINAL  Final   Organism ID, Bacteria ENTEROCOCCUS FAECALIS  Final      Susceptibility   Enterococcus faecalis - MIC*    AMPICILLIN <=2 SENSITIVE Sensitive     LINEZOLID 2 SENSITIVE Sensitive     CIPROFLOXACIN Value in next row Resistant      RESISTANT>=8    TETRACYCLINE Value in next row Resistant      RESISTANT>=16    VANCOMYCIN Value in next row Resistant      RESISTANT>=32    GENTAMICIN SYNERGY Value in next row Resistant      RESISTANT>=32    * ENTEROCOCCUS FAECALIS  Culture, respiratory (NON-Expectorated)     Status: None   Collection Time: 09/16/15  3:50 PM  Result Value Ref Range Status   Specimen Description TRACHEAL ASPIRATE  Final   Special Requests  Immunocompromised  Final   Gram Stain   Final    FEW WBC SEEN GOOD SPECIMEN - 80-90% WBCS RARE GRAM NEGATIVE RODS    Culture LIGHT GROWTH PSEUDOMONAS AERUGINOSA  Final   Report Status 09/19/2015 FINAL  Final   Organism ID, Bacteria PSEUDOMONAS AERUGINOSA  Final      Susceptibility   Pseudomonas aeruginosa - MIC*    CEFTAZIDIME 8 SENSITIVE Sensitive     CIPROFLOXACIN 2 INTERMEDIATE Intermediate     GENTAMICIN >=16 RESISTANT Resistant     IMIPENEM >=16 RESISTANT Resistant     PIP/TAZO Value in next row Sensitive      SENSITIVE32    CEFEPIME Value in next row Sensitive      SENSITIVE8    LEVOFLOXACIN Value in next row Resistant      RESISTANT>=8    * LIGHT GROWTH PSEUDOMONAS AERUGINOSA  Culture, expectorated sputum-assessment     Status: None   Collection Time: 09/22/15  2:09 PM  Result Value Ref Range Status   Specimen Description ENDOTRACHEAL  Final   Special Requests Normal  Final   Sputum evaluation THIS SPECIMEN IS ACCEPTABLE FOR SPUTUM CULTURE  Final   Report Status 09/24/2015 FINAL  Final  Culture, respiratory (NON-Expectorated)     Status: None   Collection Time: 09/22/15  2:09 PM  Result Value Ref Range Status   Specimen Description ENDOTRACHEAL  Final   Special Requests Normal Reflexed from [id number]  Final   Gram Stain   Final    FEW WBC SEEN FEW GRAM NEGATIVE RODS POOR SPECIMEN - LESS THAN 70% WBCS    Culture   Final    MODERATE GROWTH PSEUDOMONAS AERUGINOSA LIGHT GROWTH KLEBSIELLA PNEUMONIAE REFER TO SENSITIVITIES FROM PREVIOUS CULTURE FOR ORG 2    Report Status 10/01/2015 FINAL  Final   Organism ID, Bacteria PSEUDOMONAS AERUGINOSA  Final   Organism ID, Bacteria KLEBSIELLA PNEUMONIAE  Final      Susceptibility   Pseudomonas aeruginosa - MIC*    CEFTAZIDIME 4 SENSITIVE Sensitive     CIPROFLOXACIN 2 INTERMEDIATE Intermediate     GENTAMICIN >=16 RESISTANT Resistant     IMIPENEM >=16 RESISTANT Resistant     PIP/TAZO Value in next row Sensitive      SENSITIVE16     * MODERATE GROWTH PSEUDOMONAS AERUGINOSA  Tissue culture     Status: None   Collection Time: 09/24/15  6:44 AM  Result Value Ref Range Status   Specimen Description BONE  Final   Special Requests Normal  Final   Gram Stain MODERATE WBC SEEN FEW GRAM NEGATIVE RODS   Final   Culture   Final    MODERATE GROWTH ESCHERICHIA COLI MODERATE GROWTH KLEBSIELLA PNEUMONIAE ESBL-EXTENDED SPECTRUM BETA LACTAMASE-THE ORGANISM IS RESISTANT TO PENICILLINS, CEPHALOSPORINS AND AZTREONAM ACCORDING TO CLSI M100-S15 VOL.25 N01 JAN 2005. ORGANISM 1 This organism isolate is resistant to one or more antiotic agents in three or more antimicrobial categories.  Suggest Infectious Disease consult.   ORGANISM 2 CRITICAL RESULT CALLED TO, READ BACK BY AND VERIFIED WITH: RN Mardene Celeste LINDSAY 09/27/15 1005AM    Report Status 09/28/2015 FINAL  Final   Organism ID, Bacteria ESCHERICHIA COLI  Final   Organism ID, Bacteria KLEBSIELLA PNEUMONIAE  Final      Susceptibility   Escherichia coli - MIC*    AMPICILLIN >=32 RESISTANT Resistant     CEFTAZIDIME 4 RESISTANT Resistant     CEFAZOLIN >=64 RESISTANT Resistant     CEFTRIAXONE 8 RESISTANT Resistant     GENTAMICIN <=1 SENSITIVE Sensitive     IMIPENEM 8 RESISTANT Resistant     TRIMETH/SULFA <=20 SENSITIVE Sensitive     Extended ESBL POSITIVE Resistant     PIP/TAZO Value in next row Resistant      RESISTANT>=128    * MODERATE GROWTH ESCHERICHIA  COLI   Klebsiella pneumoniae - MIC*    AMPICILLIN Value in next row Resistant      RESISTANT>=128    CEFTAZIDIME Value in next row Resistant      RESISTANT>=128    CEFAZOLIN Value in next row Resistant      RESISTANT>=128    CEFTRIAXONE Value in next row Resistant      RESISTANT>=128    CIPROFLOXACIN Value in next row Resistant      RESISTANT>=128    GENTAMICIN Value in next row Resistant      RESISTANT>=128    IMIPENEM Value in next row Resistant      RESISTANT>=128    TRIMETH/SULFA Value in next row Sensitive       RESISTANT>=128    PIP/TAZO Value in next row Resistant      RESISTANT>=128    * MODERATE GROWTH KLEBSIELLA PNEUMONIAE  Anaerobic culture     Status: None   Collection Time: 09/24/15  3:55 PM  Result Value Ref Range Status   Specimen Description BONE  Final   Special Requests Normal  Final   Culture NO ANAEROBES ISOLATED  Final   Report Status 09/29/2015 FINAL  Final  Culture, fungus without smear     Status: None   Collection Time: 09/24/15  3:55 PM  Result Value Ref Range Status   Specimen Description BONE  Final   Special Requests Normal  Final   Culture NO FUNGUS ISOLATED AFTER 24 DAYS  Final   Report Status 10/18/2015 FINAL  Final  C difficile quick scan w PCR reflex     Status: None   Collection Time: 10/07/15  2:02 PM  Result Value Ref Range Status   C Diff antigen NEGATIVE NEGATIVE Final   C Diff toxin NEGATIVE NEGATIVE Final   C Diff interpretation Negative for C. difficile  Final  Culture, blood (routine x 2)     Status: None   Collection Time: 10/16/15  9:52 AM  Result Value Ref Range Status   Specimen Description BLOOD LEFT HAND  Final   Special Requests   Final    BOTTLES DRAWN AEROBIC AND ANAEROBIC  AER 6CC ANA 4CC   Culture NO GROWTH 6 DAYS  Final   Report Status 10/22/2015 FINAL  Final  Culture, blood (routine x 2)     Status: None   Collection Time: 10/16/15 12:25 PM  Result Value Ref Range Status   Specimen Description BLOOD LEFT HAND  Final   Special Requests BOTTLES DRAWN AEROBIC AND ANAEROBIC  5CC  Final   Culture NO GROWTH 6 DAYS  Final   Report Status 10/22/2015 FINAL  Final  Culture, expectorated sputum-assessment     Status: None   Collection Time: 10/18/15  1:15 PM  Result Value Ref Range Status   Specimen Description SPUTUM  Final   Special Requests NONE  Final   Sputum evaluation THIS SPECIMEN IS ACCEPTABLE FOR SPUTUM CULTURE  Final   Report Status 10/18/2015 FINAL  Final  Culture, respiratory (NON-Expectorated)     Status: None   Collection  Time: 10/18/15  1:15 PM  Result Value Ref Range Status   Specimen Description SPUTUM  Final   Special Requests NONE Reflexed from [id number]  Final   Gram Stain   Final    GOOD SPECIMEN - 80-90% WBCS MODERATE WBC SEEN MANY GRAM NEGATIVE RODS    Culture   Final    HEAVY GROWTH PSEUDOMONAS AERUGINOSA MODERATE GROWTH KLEBSIELLA PNEUMONIAE CRITICAL RESULT CALLED TO, READ BACK  BY AND VERIFIED WITHRod Mae AT 1610 10/21/15 DV KLEBSIELLA PNEUMONIAE This organism isolate is resistant to one or more antiotic agents in three or more antimicrobial categories.  Suggest Infectious Disease  consult.      Report Status 10/22/2015 FINAL  Final   Organism ID, Bacteria PSEUDOMONAS AERUGINOSA  Final   Organism ID, Bacteria KLEBSIELLA PNEUMONIAE  Final      Susceptibility   Klebsiella pneumoniae - MIC*    AMPICILLIN >=32 RESISTANT Resistant     CEFAZOLIN >=64 RESISTANT Resistant     CEFTRIAXONE >=64 RESISTANT Resistant     CIPROFLOXACIN >=4 RESISTANT Resistant     GENTAMICIN >=16 RESISTANT Resistant     IMIPENEM >=16 RESISTANT Resistant     NITROFURANTOIN >=512 RESISTANT Resistant     TRIMETH/SULFA <=20 SENSITIVE Sensitive     * MODERATE GROWTH KLEBSIELLA PNEUMONIAE   Pseudomonas aeruginosa - MIC*    CEFTAZIDIME 4 SENSITIVE Sensitive     CIPROFLOXACIN >=4 RESISTANT Resistant     GENTAMICIN 8 INTERMEDIATE Intermediate     IMIPENEM >=16 RESISTANT Resistant     PIP/TAZO Value in next row Sensitive      SENSITIVE32    * HEAVY GROWTH PSEUDOMONAS AERUGINOSA  C difficile quick scan w PCR reflex     Status: None   Collection Time: 10/18/15  4:39 PM  Result Value Ref Range Status   C Diff antigen NEGATIVE NEGATIVE Final   C Diff toxin NEGATIVE NEGATIVE Final   C Diff interpretation Negative for C. difficile  Final    Coagulation Studies: No results for input(s): LABPROT, INR in the last 72 hours.  Urinalysis: No results for input(s): COLORURINE, LABSPEC, PHURINE, GLUCOSEU, HGBUR, BILIRUBINUR,  KETONESUR, PROTEINUR, UROBILINOGEN, NITRITE, LEUKOCYTESUR in the last 72 hours.  Invalid input(s): APPERANCEUR    Imaging: Dg Chest Port 1 View  10/26/2015  CLINICAL DATA:  Respiratory failure, aspiration pneumonia, septic shock, CVA, history of coronary artery disease CHF and renal insufficiency EXAM: PORTABLE CHEST 1 VIEW COMPARISON:  Portable chest x-ray of October 19, 2015 FINDINGS: The lungs are adequately inflated. There are persistent confluent alveolar opacities bilaterally. The left hemidiaphragm is partially obscured but slightly better demonstrated than on the previous study. The cardiac silhouette remains enlarged. The central pulmonary vascularity remains engorged. A dual-lumen dialysis catheter is in stable position with the distal tip at the cavoatrial junction. The feeding tube tip projects below the inferior margin of the image. The left internal jugular venous catheter tip projects over the proximal SVC. The tracheostomy appliance tip lies at the level of the superior margin of the clavicular heads. IMPRESSION: Stable bilateral alveolar opacities compatible with pneumonia and/or pulmonary edema. Slight improved aeration of the left lower lobe. The support tubes are in reasonable position. Electronically Signed   By: David  [place] M.D.   On: 10/26/2015 07:15     Medications:   . feeding supplement (JEVITY 1.5 CAL/FIBER) 1,000 mL (10/26/15 1620)  . norepinephrine Stopped (10/08/15 0530)   . amantadine  200 mg Per Tube Q7 days  . antiseptic oral rinse  7 mL Mouth Rinse QID  . aspirin  81 mg Oral Daily  . chlorhexidine gluconate  15 mL Mouth Rinse BID  . epoetin (EPOGEN/PROCRIT) injection  10,000 Units Intravenous Q M,W,F-HD  . escitalopram  10 mg Oral Daily  . feeding supplement (PRO-STAT SUGAR FREE 64)  30 mL Oral 6 X Daily  . folic acid  1 mg Oral Daily  . free water  20 mL  Per Tube 6 times per day  . heparin subcutaneous  5,000 Units Subcutaneous Q12H  . hydrocortisone   10 mg Per Tube BID  . insulin aspart  0-9 Units Subcutaneous 6 times per day  . ipratropium-albuterol  3 mL Nebulization QID  . lidocaine  1 patch Transdermal Q24H  . loperamide  2 mg Oral BID  . midodrine  5 mg Per Tube TID WC  . multivitamin  5 mL Oral Daily  . pantoprazole sodium  40 mg Per Tube Daily  . saccharomyces boulardii  250 mg Oral BID  . sodium chloride  10-40 mL Intracatheter Q12H  . sulfamethoxazole-trimethoprim  2.5 tablet Per Tube q1800  . thiamine  100 mg Per Tube Daily   sodium chloride, sodium chloride, acetaminophen (TYLENOL) oral liquid 160 mg/5 mL, HYDROmorphone (DILAUDID) injection, loperamide, morphine injection, ondansetron (ZOFRAN) IV, oxyCODONE, sodium chloride, zinc oxide  Assessment/ Plan:  49 y.o. 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 Admitted on 09/05/15  1. End-stage renal disease on hemodialysis on HD MWF. The patient has been on dialysis since October of 2014. R IJ permcath. Dialysis treatment with IV albumin for oncotic support. Midodrine and hydrocortisone ordered for blood pressure support.  -  Monitor daily for dialysis need. Dialysis for later today: albumin, midodrine and hydrocortisone for hemodynamic support.  Continue MWF schedule for now.  UF goal of 0.5 to 1 kg - tolerated well. May increase ultrafiltration goal on next treatment.   2. Anemia of CKD. Hemoglobin 8 -  Continue to monitor CBC  - Continue scheduled Epogen for anemia of chronic kidney disease.  3. Secondary hyperparathyroidism: PTH low at 55 (9/25). New PTH sent yesterday - Phosphorous 4.5 and acceptable. She remains off Binder therapy and activated vitamin D.    4. Sepsis: VRE in blood. Bone cultures E. Coli and klebsiella - Treatment as per ID and ICU team: daptomycin completed on 11/10 and now on trimethoprim/sulfamethoxazole. -Pseudomonas growing in the sputum. Continue ceftazidime.  5. Acute resp failure: mechanical ventilation, intubated -Pseudomonas noted in the sputum.  -Continue ventilator support, ceftazidime added to medication regimen. - Appreciate pulmonary input.    LOS: 76 Deeann Servidio 12/8/20168:06 AM

## 2015-10-28 LAB — RENAL FUNCTION PANEL
ALBUMIN: 2.1 g/dL — AB (ref 3.5–5.0)
Anion gap: 10 (ref 5–15)
BUN: 72 mg/dL — AB (ref 6–20)
CALCIUM: 8.7 mg/dL — AB (ref 8.9–10.3)
CO2: 33 mmol/L — ABNORMAL HIGH (ref 22–32)
Chloride: 97 mmol/L — ABNORMAL LOW (ref 101–111)
Creatinine, Ser: 1.25 mg/dL — ABNORMAL HIGH (ref 0.44–1.00)
GFR calc Af Amer: 57 mL/min — ABNORMAL LOW (ref >60)
GFR calc non Af Amer: 49 mL/min — ABNORMAL LOW (ref >60)
GLUCOSE: 152 mg/dL — AB (ref 65–99)
PHOSPHORUS: 4.7 mg/dL — AB (ref 2.5–4.6)
Potassium: 5.8 mmol/L — ABNORMAL HIGH (ref 3.5–5.1)
SODIUM: 140 mmol/L (ref 135–145)

## 2015-10-28 LAB — CALCITRIOL (1,25 DI-OH VIT D): Vit D, 1,25-Dihydroxy: 7.8 pg/mL — ABNORMAL LOW (ref 19.9–79.3)

## 2015-10-28 LAB — CBC
HCT: 25.1 % — ABNORMAL LOW (ref 35.0–47.0)
Hemoglobin: 7.9 g/dL — ABNORMAL LOW (ref 12.0–16.0)
MCH: 30.4 pg (ref 26.0–34.0)
MCHC: 31.7 g/dL — AB (ref 32.0–36.0)
MCV: 96 fL (ref 80.0–100.0)
Platelets: 248 10*3/uL (ref 150–440)
RBC: 2.62 MIL/uL — ABNORMAL LOW (ref 3.80–5.20)
RDW: 17.1 % — AB (ref 11.5–14.5)
WBC: 8.2 10*3/uL (ref 3.6–11.0)

## 2015-10-28 LAB — PARATHYROID HORMONE, INTACT (NO CA): PTH: 43 pg/mL (ref 15–65)

## 2015-10-28 LAB — VITAMIN D 25 HYDROXY (VIT D DEFICIENCY, FRACTURES): Vit D, 25-Hydroxy: 17.5 ng/mL — ABNORMAL LOW (ref 30.0–100.0)

## 2015-10-28 LAB — GLUCOSE, CAPILLARY
GLUCOSE-CAPILLARY: 111 mg/dL — AB (ref 65–99)
GLUCOSE-CAPILLARY: 115 mg/dL — AB (ref 65–99)
GLUCOSE-CAPILLARY: 121 mg/dL — AB (ref 65–99)
GLUCOSE-CAPILLARY: 94 mg/dL (ref 65–99)
GLUCOSE-CAPILLARY: 98 mg/dL (ref 65–99)
Glucose-Capillary: 119 mg/dL — ABNORMAL HIGH (ref 65–99)

## 2015-10-28 MED ORDER — LOPERAMIDE HCL 2 MG PO CAPS
2.0000 mg | ORAL_CAPSULE | Freq: Every day | ORAL | Status: DC | PRN
  Administered 2015-10-28: 2 mg via ORAL
  Filled 2015-10-28: qty 1

## 2015-10-28 NOTE — Progress Notes (Signed)
ANTIBIOTIC CONSULT NOTE - Follow up  Pharmacy Consult for Ceftazidime  Indication: pneumonia  Allergies  Allergen Reactions  . Contrast Media [Iodinated Diagnostic Agents] Anaphylaxis  . Ampicillin Rash   Patient Measurements: Height:  (SCALE BROKE) Weight: 224 lb 13.9 oz (102 kg) IBW/kg (Calculated) : 66.2  Vital Signs: Temp: 98.4 F (36.9 C) (12/09 1230) Temp Source: Axillary (12/09 1230) BP: 122/66 mmHg (12/09 1345) Pulse Rate: 102 (12/09 1345) Intake/Output from previous day: 12/08 0701 - 12/09 0700 In: 1330 [NG/GT:1330] Out: -    Recent Labs  10/26/15 1116 10/28/15 1050  WBC 9.0 8.2  HGB 8.0* 7.9*  PLT 215 248  CREATININE 1.29* 1.25*   Estimated Creatinine Clearance: 68.4 mL/min (by C-G formula based on Cr of 1.25).   Microbiology: Recent Results (from the past 720 hour(s))  C difficile quick scan w PCR reflex     Status: None   Collection Time: 10/07/15  2:02 PM  Result Value Ref Range Status   C Diff antigen NEGATIVE NEGATIVE Final   C Diff toxin NEGATIVE NEGATIVE Final   C Diff interpretation Negative for C. difficile  Final  Culture, blood (routine x 2)     Status: None   Collection Time: 10/16/15  9:52 AM  Result Value Ref Range Status   Specimen Description BLOOD LEFT HAND  Final   Special Requests   Final    BOTTLES DRAWN AEROBIC AND ANAEROBIC  AER 6CC ANA 4CC   Culture NO GROWTH 6 DAYS  Final   Report Status 10/22/2015 FINAL  Final  Culture, blood (routine x 2)     Status: None   Collection Time: 10/16/15 12:25 PM  Result Value Ref Range Status   Specimen Description BLOOD LEFT HAND  Final   Special Requests BOTTLES DRAWN AEROBIC AND ANAEROBIC  5CC  Final   Culture NO GROWTH 6 DAYS  Final   Report Status 10/22/2015 FINAL  Final  Culture, expectorated sputum-assessment     Status: None   Collection Time: 10/18/15  1:15 PM  Result Value Ref Range Status   Specimen Description SPUTUM  Final   Special Requests NONE  Final   Sputum evaluation  THIS SPECIMEN IS ACCEPTABLE FOR SPUTUM CULTURE  Final   Report Status 10/18/2015 FINAL  Final  Culture, respiratory (NON-Expectorated)     Status: None   Collection Time: 10/18/15  1:15 PM  Result Value Ref Range Status   Specimen Description SPUTUM  Final   Special Requests NONE Reflexed from [id number]  Final   Gram Stain   Final    GOOD SPECIMEN - 80-90% WBCS MODERATE WBC SEEN MANY GRAM NEGATIVE RODS    Culture   Final    HEAVY GROWTH PSEUDOMONAS AERUGINOSA MODERATE GROWTH KLEBSIELLA PNEUMONIAE CRITICAL RESULT CALLED TO, READ BACK BY AND VERIFIED WITHRod Mae AT 1610 10/21/15 DV KLEBSIELLA PNEUMONIAE This organism isolate is resistant to one or more antiotic agents in three or more antimicrobial categories.  Suggest Infectious Disease  consult.      Report Status 10/22/2015 FINAL  Final   Organism ID, Bacteria PSEUDOMONAS AERUGINOSA  Final   Organism ID, Bacteria KLEBSIELLA PNEUMONIAE  Final      Susceptibility   Klebsiella pneumoniae - MIC*    AMPICILLIN >=32 RESISTANT Resistant     CEFAZOLIN >=64 RESISTANT Resistant     CEFTRIAXONE >=64 RESISTANT Resistant     CIPROFLOXACIN >=4 RESISTANT Resistant     GENTAMICIN >=16 RESISTANT Resistant     IMIPENEM >=16 RESISTANT  Resistant     NITROFURANTOIN >=512 RESISTANT Resistant     TRIMETH/SULFA <=20 SENSITIVE Sensitive     * MODERATE GROWTH KLEBSIELLA PNEUMONIAE   Pseudomonas aeruginosa - MIC*    CEFTAZIDIME 4 SENSITIVE Sensitive     CIPROFLOXACIN >=4 RESISTANT Resistant     GENTAMICIN 8 INTERMEDIATE Intermediate     IMIPENEM >=16 RESISTANT Resistant     PIP/TAZO Value in next row Sensitive      SENSITIVE32    * HEAVY GROWTH PSEUDOMONAS AERUGINOSA  C difficile quick scan w PCR reflex     Status: None   Collection Time: 10/18/15  4:39 PM  Result Value Ref Range Status   C Diff antigen NEGATIVE NEGATIVE Final   C Diff toxin NEGATIVE NEGATIVE Final   C Diff interpretation Negative for C. difficile  Final   Assessment: 49  yo female with ESRD receiving HD every MWF. Pharmacy consulted for dosing and monitoring of ceftazidime.   Patients sputum cultures showing heavy growth of Pseudomonas and Klebsiella.  Suspectibilites show Pseudomonas is sensitive to Ceftazidime and Klebsiella is sensitive to Septra which is currently on board as well.   Plan:  Will continue patient on Ceftazidime 1g every 24 hours for HD dosing through 12/11.  Pharmacy will continue to monitor labs.  Luisa HartScott Floriene Jeschke, PharmD   10/28/2015 3:05 PM

## 2015-10-28 NOTE — Progress Notes (Signed)
[facility] [place] [facility] Medicine Progess Note  Name: Courtney GrinderHedda McMillian [name] MRN: [id number] DOB: 01-06-1966    ADMISSION DATE:  11-18-15   INITIAL PRESENTATION:  49 AAF who has been in medical facilities (hosp, LTAC, rehab) for 2 yrs following gastric bypass surgery with multiple complications. Now with chronic trach, ESRD, profound debilitation, severe sacral pressure ulcer. Was seemingly making progress and transferred to rehab facility approx one week prior to this admission. [facility] and hypot[facility]. Working dx of severe sepsis/septic shock due to infected sacral pressure ulcer. Since admission. Her course is been very complicated with numerous complications including septic shock and GI bleeding.    INTERVAL HISTORY: Patient doing much better this morning, more alert, noted to have some mild tachypnea when placed on lower settings of pressure support yesterday. Placed back on the vent for the majority of the day and night. We will attempt pressure support again today, as planned on a daily basis. I have spoken to the husband reintroduced ID of a possible PEG tube placement in the near future, he states that she does have a complex GI anatomy and the obstacle in the past was finding a surgeon suitable to place an NG tube, at this time he again would like patient to be liberated from the vent so that she may move forward to taking oral intake.  Summary of MAJOR EVENTS/TEST RESULTS: Admission 02/07/14-05/07/14 Admission 07/21/14-09/06/14 Discharged to [facility]. Pt had palliative consult at that time, were asked to sign off by husband.  09/23 CT head: NAD 09/23 EEG: no epileptiform activity 09/23 PRBCs for Hgb 6.4 09/24 bedside debridement of sacral wound. Abscess drained 09/25 Off vasopressors. More alert. No distress. Worsening thrombocytopenia. Vanc DC'd 09/29  Dr. [name] (I.D) excused from the case by patient's husband. 10/02 MRI -multiple infarcts 10/03  tracheal bleeding- transfused platelets 10/03 hospitalist service excused from the case by patient's husband 10/03 Echocardiogram ejection fraction was 55-60%, pulmonary systolic pressure was 39 mmHg 52/8410/08 restart TF's at lower rate, attempt reg HD  10/12 Transferred to med-surg floor. Remains on PCCM service 10/14 SLP eval: pt unable to tolerate PMV adequately 10/17-will re-attempt PM valve-discussed with Speech therapist 10/18 passed swallow eval-start pureed thick foods no thin liquids-continue NG feeds 10/19 transferred to step down for sepsis/aspiration pneumonia 10/19 cxr shows RLL opacity 10/21 sacral decub debride at bedside by surgery 10/22 started back on vasopressors while on HD 10/27 CT with osteo, R hip fx, unable to identify tip of dubhoff tube - sent for fluoro study 10/28 Ortho consultation: I do not feel that she is a surgical candidate. Therefore, I feel that it would best to manage this fracture nonsurgically and allow it to heal by itself over time, which it should.  10/28 Gen Surg consultation: Due to the lack of free air or free flow of contrast and the peritoneum there is no indication for any surgical intervention on this. Would recommend pulling back the feeding tube 1-2 cm. Would recheck an abdominal film to confirm no pneumoperitoneum in the morning. Absence of any changes okay to continue using Dobbhoff for feeding and medications. 10/28 gastrograffin study: The study confirms that the feeding tube pes perforated through the duodenum and the tip is within a cavity that fills with injected contrast. The cavity does appear walled off 11/4 refuses oral feeds, continue TF's CT reviewed: T8-T9 discitis/osteomyelitis, RT HIP FRACTURE 11/5 placed back on vasopressors, placed back on Vent due to resp acidosis, levophed turned off  11/6 afternoon 11/5 PM Trach changed out due to cuff leak, #6 Shiley cuffed, 11/6 dubhoff occluded, removed and replaced, new dubhoff shows tube in  the antral stomach 11/15 refusing to take oral feeds 11/18 placed back on Vent for increased WOB,SOB. 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 10/18/2015; the patient developed a new left basal pneumonia; with recurrent sepsis.  10/19/2015; fevers resolved with IV acetaminophen. Tube feeds changed from vital to Jevity.  12/7 CXR with stable b/l opacities.    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 Pneumoniae, P. Mirabilis, VRE Wound 9/26 >> No growth Blood 9/23 >> NEG CDiff 9/27>>neg Stool Cx 10/15>> negative Cdiff 10/25>>neg Trach Aspirate 10/28>> light growth pseudomonas Blood 10/28 >> 1/2 GPC >>  Sputum cultures obtained 09/22/15 due to mucus plugging>>Pseudomonas BONE TISSUE Cx 11/3>>E.coli (ESBL), k. Pneumonia (daptomycin and septra) Bld Cx 11/27>>negative thus far.  Trach Asp 11/27>> grew out Pseudomonas, and Klebsiella pneumonia.  ANTIMICROBIALS:  Aztreonam 9/23 >> 9/24 Vanc 9/23 >> 9/25 Vanc 9/26>>9/27 Daptomycin 9/27>> 10/12, 10/28>> off Meropenem 9/23 >> 10/14, 10/28 >> 10/31 Septra 11/8>> Zosyn 11/27,>> 11/30. Levaquin 11/29>> 11/29. Ceftaz 12/1>>  VITAL SIGNS: Temp:  [98.3 F (36.8 C)-98.7 F (37.1 C)] 98.7 F (37.1 C) (12/09 1045) Pulse Rate:  [93-107] 97 (12/09 1200) Resp:  [1-32] 21 (12/09 1200) BP: (108-136)/(31-84) 121/81 mmHg (12/09 1200) SpO2:  [95 %-100 %] 100 % (12/09 1000) FiO2 (%):  [30 %] 30 % (12/09 1215) Weight:  [224 lb 13.9 oz (102 kg)] 224 lb 13.9 oz (102 kg) (12/09 0445) HEMODYNAMICS:   VENTILATOR  SETTINGS: Vent Mode:  [-] PSV FiO2 (%):  [30 %] 30 % Set Rate:  [14 bmp-16 bmp] 14 bmp Vt Set:  [500 mL-530 mL] 530 mL PEEP:  [5 cmH20] 5 cmH20 Pressure Support:  [12 cmH20] 12 cmH20 INTAKE / OUTPUT:  Intake/Output Summary (Last 24 hours) at 10/28/15 1227 Last data filed at 10/28/15 1000  Gross per 24 hour  Intake   1210 ml  Output      0 ml  Net   1210 ml   PHYSICAL EXAMINATION: Physical Examination:   VS: BP 121/81 mmHg  Pulse 97  Temp(Src) 98.7 F (37.1 C) (Oral)  Resp 21  Ht 5\' 9"  (1.753 m)  Wt 224 lb 13.9 oz (102 kg)  BMI 33.19 kg/m2  SpO2 100%  LMP  (LMP Unknown)  General Appearance: No resp distress, coarse upper airway sounds, on vent Neuro: profoundly diffusely weak, alert today, sleeping but easily arousable HEENT: cushingoid facies, PERRLA, EOM intact Neck: trach site clean Pulmonary: clear anteriorly Cardiovascular: reg, + syst murmur Abdomen: soft, NT, +BS Extremities: warm, R>L UE edema    LABORATORY PANEL:   CBC  Recent Labs Lab 10/28/15 1050  WBC 8.2  HGB 7.9*  HCT 25.1*  PLT 248    Chemistries   Recent Labs Lab 10/28/15 1050  NA 140  K 5.8*  CL 97*  CO2 33*  GLUCOSE 152*  BUN 72*  CREATININE 1.25*  CALCIUM 8.7*  PHOS 4.7*       IMPRESSION/PLAN (current major  issues):  Recurrent sepsis, due to pneumonia.   Anemia with macrocytosis -Uncertain if this is due to chronic slow GI bleed or simple critical care associated anemia. She has already receiving folate, thiamine, and multivitamin. Her anemia has not responded to oral medications, this may be due to her GI malabsorption. Will also check Vit D levels (discussed with nephrology) - PTH, 25OH, 1,25OH Vit d  Pneumonia with sepsis-better. -Chest x-ray shows new left lower lobe infiltrate,which is now improving.  Sputum cultures show pseudomonas. The patient is once again, vent dependent. She has developed again recurrent sepsis.Marland Kitchen Sputum cultures showing Pseudomonas, and  Klebsiella, currently covered with this. Ceftaz total 10 days, stop date 12/11 Weaning trial daily off MV - currently on PSV trials with plan to transition back to TCT  Worsening sacral decubitus ulcer, stage IV. -The patient is already had a sacral bone scraping, and has been placed on Septra for antibiotic, per ID recommendations. Her sacral decubitus does not appear to be improving and in fact appears to be worsening. In addition, she has several new smaller areas of tissue breakdown.  End-stage renal disease. -Continue on hemodialysis per nephrology.  Diarrhea. -This has improved since changing her loperamide 2 twice daily scheduled, and changing her tube feeds back to Jevity. Continue loperamide to when necessary.   Trach Care -changed on 11/5 to #6 Shiley due to cuff leak   Patient appears to be stable for transfer to an LTAC facility at this point in time.   Additional Chronic issues and complicating factors during this admission.  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  Thrombocytopenia, resolved - HIT panel negative 9/25 Recurrent Severe sepsis, due to sacral pressure ulcer DM 2, controlled.  Chronic steroid therapy, for a history of of adrenal insufficiency Incidental finding of R hip fx - conservative mgmt. Waxing and waning encephalopathy - she was evaluated by both the psychiatry and neurology services, and it was deemed that the patient lacks decisional capacity at this time. 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 Husband declined TEE Profound deconditioning-criticall illness neuropathy Chronic pain   Updated husband via telephone  Stephanie Acre, MD [facility] Pager [phone] (please enter 7-digits) On Call Pager - [phone] (please enter 7-digits)    Critical Care Attestation.  I have personally obtained a history, examined the patient, evaluated laboratory  and imaging results, formulated the assessment and plan and placed orders. The case was discussed with the critical care RN, nutrition, respiratory therapist, Associate Professor. The Patient requires high complexity decision making for assessment and support, frequent evaluation and titration of therapies, application of advanced monitoring technologies and extensive interpretation of multiple databases. The patient has critical illness that could lead imminently to failure of 1 or more organ systems and requires the highest level of physician preparedness to intervene.  Critical Care Time devoted to patient care services described in this note is 35 minutes and is exclusive of time spent in procedures.

## 2015-10-28 NOTE — Care Management (Signed)
Discussed need to to readdress placement of PEG which may enable to assess for vent snf.

## 2015-10-28 NOTE — Progress Notes (Signed)
MEDICATION RELATED CONSULT NOTE - Follow up   Pharmacy Consult for Renal dosing  Allergies  Allergen Reactions  . Contrast Media [Iodinated Diagnostic Agents] Anaphylaxis  . Ampicillin Rash    Patient Measurements: Height:  (SCALE BROKE) Weight: 224 lb 13.9 oz (102 kg) IBW/kg (Calculated) : 66.2  Vital Signs: Temp: 98.4 F (36.9 C) (12/09 1230) Temp Source: Axillary (12/09 1230) BP: 122/66 mmHg (12/09 1345) Pulse Rate: 102 (12/09 1345)  Recent Labs  10/26/15 1116 10/28/15 1050  WBC 9.0 8.2  HGB 8.0* 7.9*  HCT 25.8* 25.1*  PLT 215 248  CREATININE 1.29* 1.25*  PHOS 4.5 4.7*  ALBUMIN 2.2* 2.1*   Estimated Creatinine Clearance: 68.4 mL/min (by C-G formula based on Cr of 1.25).   Assessment: 49 yo patient on HD. Pharmacy consulted for renal dosing of medications.   Plan:  No medications require adjustment at present. Pharmacy will continue to monitor patients medications and dose adjust as needed.    Luisa HartScott Lacosta Hargan, PharmD  10/28/2015

## 2015-10-28 NOTE — Progress Notes (Signed)
Subjective:   Hemodialysis on Wednesday. Hemodialysis later today.   Objective:  Vital signs in last 24 hours:  Temp:  [98.1 F (36.7 C)-98.6 F (37 C)] 98.6 F (37 C) (12/09 0757) Pulse Rate:  [93-99] 98 (12/09 0800) Resp:  [1-32] 28 (12/09 0800) BP: (111-132)/(31-78) 132/60 mmHg (12/09 0800) SpO2:  [95 %-100 %] 98 % (12/09 0800) FiO2 (%):  [30 %] 30 % (12/09 0845) Weight:  [102 kg (224 lb 13.9 oz)] 102 kg (224 lb 13.9 oz) (12/09 0445)  Weight change:  Filed Weights   10/28/15 0445  Weight: 102 kg (224 lb 13.9 oz)    Intake/Output: I/O last 3 completed shifts: In: 1450 [NG/GT:1450] Out: -    Intake/Output this shift:  Total I/O In: 70 [NG/GT:70] Out: -   Physical Exam: General: critically ill appearing  Head: NG in place, OM moist  Eyes: anicteric  Neck: Tracheostomy in place  Lungs:  Scattered rhonchi, vent assisted fio2 35%,   Heart: S1S2 no rubs  Abdomen:  Soft, nontender  Extremities: 1+ dependent edema, anasarca  Neurologic: Awake, not consistently following commands  Access:  R IJ permcath.       Basic Metabolic Panel:  Recent Labs Lab 10/21/15 1720 10/24/15 2119 10/25/15 0500 10/26/15 1116  NA 138 140 136 139  K 4.8 4.8 4.8 5.6*  CL 97* 99* 96* 97*  CO2 33* 35* 33* 33*  GLUCOSE 139* 132* 126* 104*  BUN 69* 71* 60* 83*  CREATININE 1.06* 1.10* 1.04* 1.29*  CALCIUM 8.3* 8.3* 8.2* 8.8*  PHOS 3.5  3.6 3.9  --  4.5    Liver Function Tests:  Recent Labs Lab 10/21/15 1720 10/24/15 2119 10/26/15 1116  ALBUMIN 2.0* 1.9* 2.2*   No results for input(s): LIPASE, AMYLASE in the last 168 hours. No results for input(s): AMMONIA in the last 168 hours.  CBC:  Recent Labs Lab 10/21/15 1720 10/21/15 2301 10/22/15 0457 10/24/15 2026 10/25/15 0500 10/26/15 1116  WBC 11.4*  --  11.9* 7.4 8.0 9.0  HGB 6.7* 8.2* 7.8* 7.6* 7.6* 8.0*  HCT 22.2* 26.1* 25.2* 24.9* 24.2* 25.8*  MCV 96.7  --  95.3 97.3 95.8 96.1  PLT 172  --  170 183 195 215     Cardiac Enzymes: No results for input(s): CKTOTAL, CKMB, CKMBINDEX, TROPONINI in the last 168 hours.  BNP: Invalid input(s): POCBNP  CBG:  Recent Labs Lab 10/27/15 1640 10/27/15 1952 10/28/15 0028 10/28/15 0431 10/28/15 0757  GLUCAP 94 85 94 121* 115*    Microbiology: Results for orders placed or performed during the hospital encounter of 08/11/2015  Blood Culture (routine x 2)     Status: None   Collection Time: 07/26/2015  8:51 AM  Result Value Ref Range Status   Specimen Description BLOOD Dolores HooseUNKO  Final   Special Requests BOTTLES DRAWN AEROBIC AND ANAEROBIC  3CC  Final   Culture NO GROWTH 5 DAYS  Final   Report Status 08/17/2015 FINAL  Final  Blood Culture (routine x 2)     Status: None   Collection Time: 07/30/2015  9:20 AM  Result Value Ref Range Status   Specimen Description BLOOD LEFT ARM  Final   Special Requests BOTTLES DRAWN AEROBIC AND ANAEROBIC  1CC  Final   Culture NO GROWTH 5 DAYS  Final   Report Status 08/17/2015 FINAL  Final  Wound culture     Status: None   Collection Time: 08/17/2015  9:20 AM  Result Value Ref Range Status  Specimen Description DECUBITIS  Final   Special Requests Normal  Final   Gram Stain   Final    FEW WBC SEEN MANY GRAM NEGATIVE RODS RARE GRAM POSITIVE COCCI    Culture   Final    HEAVY GROWTH ESCHERICHIA COLI MODERATE GROWTH ENTEROBACTER AEROGENES PROTEUS MIRABILIS HEAVY GROWTH ENTEROCOCCUS SPECIES VRE HAVE INTRINSIC RESISTANCE TO MOST COMMONLY USED ANTIBIOTICS AND THE ABILITY TO ACQUIRE RESISTANCE TO MOST AVAILABLE ANTIBIOTICS.    Report Status 08/16/2015 FINAL  Final   Organism ID, Bacteria ESCHERICHIA COLI  Final   Organism ID, Bacteria ENTEROBACTER AEROGENES  Final   Organism ID, Bacteria PROTEUS MIRABILIS  Final   Organism ID, Bacteria ENTEROCOCCUS SPECIES  Final      Susceptibility   Enterobacter aerogenes - MIC*    CEFTAZIDIME <=1 SENSITIVE Sensitive     CEFAZOLIN >=64 RESISTANT Resistant     CEFTRIAXONE <=1  SENSITIVE Sensitive     CIPROFLOXACIN <=0.25 SENSITIVE Sensitive     GENTAMICIN <=1 SENSITIVE Sensitive     IMIPENEM 1 SENSITIVE Sensitive     TRIMETH/SULFA <=20 SENSITIVE Sensitive     * MODERATE GROWTH ENTEROBACTER AEROGENES   Escherichia coli - MIC*    AMPICILLIN <=2 SENSITIVE Sensitive     CEFTAZIDIME <=1 SENSITIVE Sensitive     CEFAZOLIN <=4 SENSITIVE Sensitive     CEFTRIAXONE <=1 SENSITIVE Sensitive     CIPROFLOXACIN <=0.25 SENSITIVE Sensitive     GENTAMICIN <=1 SENSITIVE Sensitive     IMIPENEM <=0.25 SENSITIVE Sensitive     TRIMETH/SULFA <=20 SENSITIVE Sensitive     * HEAVY GROWTH ESCHERICHIA COLI   Proteus mirabilis - MIC*    AMPICILLIN >=32 RESISTANT Resistant     CEFTAZIDIME <=1 SENSITIVE Sensitive     CEFAZOLIN 8 SENSITIVE Sensitive     CEFTRIAXONE <=1 SENSITIVE Sensitive     CIPROFLOXACIN <=0.25 SENSITIVE Sensitive     GENTAMICIN <=1 SENSITIVE Sensitive     IMIPENEM 1 SENSITIVE Sensitive     TRIMETH/SULFA <=20 SENSITIVE Sensitive     * PROTEUS MIRABILIS   Enterococcus species - MIC*    AMPICILLIN >=32 RESISTANT Resistant     VANCOMYCIN >=32 RESISTANT Resistant     GENTAMICIN SYNERGY SENSITIVE Sensitive     TETRACYCLINE Value in next row Resistant      RESISTANT>=16    * HEAVY GROWTH ENTEROCOCCUS SPECIES  MRSA PCR Screening     Status: None   Collection Time: 08/06/2015  2:38 PM  Result Value Ref Range Status   MRSA by PCR NEGATIVE NEGATIVE Final    Comment:        The GeneXpert MRSA Assay (FDA approved for NASAL specimens only), is one component of a comprehensive MRSA colonization surveillance program. It is not intended to diagnose MRSA infection nor to guide or monitor treatment for MRSA infections.   Blood culture (single)     Status: None   Collection Time: 08/04/2015  3:36 PM  Result Value Ref Range Status   Specimen Description BLOOD RIGHT ASSIST CONTROL  Final   Special Requests BOTTLES DRAWN AEROBIC AND ANAEROBIC  Final   Culture NO GROWTH 5  DAYS  Final   Report Status 08/17/2015 FINAL  Final  Wound culture     Status: None   Collection Time: 08/13/15 12:37 PM  Result Value Ref Range Status   Specimen Description WOUND  Final   Special Requests Normal  Final   Gram Stain   Final    FEW WBC SEEN TOO  NUMEROUS TO COUNT GRAM NEGATIVE RODS FEW GRAM POSITIVE COCCI    Culture   Final    HEAVY GROWTH ESCHERICHIA COLI MODERATE GROWTH PROTEUS MIRABILIS LIGHT GROWTH KLEBSIELLA PNEUMONIAE MODERATE GROWTH ENTEROCOCCUS GALLINARUM CRITICAL RESULT CALLED TO, READ BACK BY AND VERIFIED WITH: [facility] BORBA AT 1042 08/16/15 DV    Report Status 08/17/2015 FINAL  Final   Organism ID, Bacteria ESCHERICHIA COLI  Final   Organism ID, Bacteria PROTEUS MIRABILIS  Final   Organism ID, Bacteria KLEBSIELLA PNEUMONIAE  Final   Organism ID, Bacteria ENTEROCOCCUS GALLINARUM  Final      Susceptibility   Escherichia coli - MIC*    AMPICILLIN >=32 RESISTANT Resistant     CEFTAZIDIME 4 RESISTANT Resistant     CEFAZOLIN >=64 RESISTANT Resistant     CEFTRIAXONE 16 RESISTANT Resistant     GENTAMICIN 2 SENSITIVE Sensitive     IMIPENEM >=16 RESISTANT Resistant     TRIMETH/SULFA <=20 SENSITIVE Sensitive     Extended ESBL POSITIVE Resistant     PIP/TAZO Value in next row Resistant      RESISTANT>=128    CIPROFLOXACIN Value in next row Sensitive      SENSITIVE<=0.25    * HEAVY GROWTH ESCHERICHIA COLI   Klebsiella pneumoniae - MIC*    AMPICILLIN Value in next row Resistant      SENSITIVE<=0.25    CEFTAZIDIME Value in next row Resistant      SENSITIVE<=0.25    CEFAZOLIN Value in next row Resistant      SENSITIVE<=0.25    CEFTRIAXONE Value in next row Resistant      SENSITIVE<=0.25    CIPROFLOXACIN Value in next row Resistant      SENSITIVE<=0.25    GENTAMICIN Value in next row Sensitive      SENSITIVE<=0.25    IMIPENEM Value in next row Resistant      SENSITIVE<=0.25    TRIMETH/SULFA Value in next row Resistant      SENSITIVE<=0.25    PIP/TAZO  Value in next row Resistant      RESISTANT>=128    * LIGHT GROWTH KLEBSIELLA PNEUMONIAE   Proteus mirabilis - MIC*    AMPICILLIN Value in next row Resistant      RESISTANT>=128    CEFTAZIDIME Value in next row Sensitive      RESISTANT>=128    CEFAZOLIN Value in next row Sensitive      RESISTANT>=128    CEFTRIAXONE Value in next row Sensitive      RESISTANT>=128    CIPROFLOXACIN Value in next row Sensitive      RESISTANT>=128    GENTAMICIN Value in next row Sensitive      RESISTANT>=128    IMIPENEM Value in next row Sensitive      RESISTANT>=128    TRIMETH/SULFA Value in next row Sensitive      RESISTANT>=128    PIP/TAZO Value in next row Sensitive      SENSITIVE<=4    * MODERATE GROWTH PROTEUS MIRABILIS   Enterococcus gallinarum - MIC*    AMPICILLIN Value in next row Resistant      SENSITIVE<=4    GENTAMICIN SYNERGY Value in next row Sensitive      SENSITIVE<=4    CIPROFLOXACIN Value in next row Resistant      RESISTANT>=8    TETRACYCLINE Value in next row Resistant      RESISTANT>=16    * MODERATE GROWTH ENTEROCOCCUS GALLINARUM  Wound culture     Status: None   Collection Time: 08/15/15  2:53 PM  Result Value Ref Range Status   Specimen Description WOUND  Final   Special Requests NONE  Final   Gram Stain FEW WBC SEEN NO ORGANISMS SEEN   Final   Culture NO GROWTH 3 DAYS  Final   Report Status 08/18/2015 FINAL  Final  C difficile quick scan w PCR reflex     Status: None   Collection Time: 08/17/15 11:34 AM  Result Value Ref Range Status   C Diff antigen NEGATIVE NEGATIVE Final   C Diff toxin NEGATIVE NEGATIVE Final   C Diff interpretation Negative for C. difficile  Final  Stool culture     Status: None   Collection Time: 09/03/15  3:51 PM  Result Value Ref Range Status   Specimen Description STOOL  Final   Special Requests Immunocompromised  Final   Culture   Final    NO SALMONELLA OR SHIGELLA ISOLATED No Pathogenic E. coli detected NO CAMPYLOBACTER DETECTED     Report Status 09/07/2015 FINAL  Final  C difficile quick scan w PCR reflex     Status: None   Collection Time: 09/13/15 12:51 AM  Result Value Ref Range Status   C Diff antigen NEGATIVE NEGATIVE Final   C Diff toxin NEGATIVE NEGATIVE Final   C Diff interpretation Negative for C. difficile  Final  Culture, blood (routine x 2)     Status: None   Collection Time: 09/16/15 11:24 AM  Result Value Ref Range Status   Specimen Description BLOOD LEFT HAND  Final   Special Requests BOTTLES DRAWN AEROBIC AND ANAEROBIC  1CC  Final   Culture NO GROWTH 5 DAYS  Final   Report Status 09/21/2015 FINAL  Final  Culture, blood (routine x 2)     Status: None   Collection Time: 09/16/15 12:23 PM  Result Value Ref Range Status   Specimen Description BLOOD RIGHT HAND  Final   Special Requests BOTTLES DRAWN AEROBIC AND ANAEROBIC  1CC  Final   Culture  Setup Time   Final    GRAM POSITIVE COCCI AEROBIC BOTTLE ONLY CRITICAL RESULT CALLED TO, READ BACK BY AND VERIFIED WITH: TESS THOMAS,RN 09/17/2015 0631 BY JRS.    Culture   Final    ENTEROCOCCUS FAECALIS AEROBIC BOTTLE ONLY VRE HAVE INTRINSIC RESISTANCE TO MOST COMMONLY USED ANTIBIOTICS AND THE ABILITY TO ACQUIRE RESISTANCE TO MOST AVAILABLE ANTIBIOTICS. CRITICAL RESULT CALLED TO, READ BACK BY AND VERIFIED WITH: CHERYL SMITH AT 4696 09/19/15 DV    Report Status 09/21/2015 FINAL  Final   Organism ID, Bacteria ENTEROCOCCUS FAECALIS  Final      Susceptibility   Enterococcus faecalis - MIC*    AMPICILLIN <=2 SENSITIVE Sensitive     LINEZOLID 2 SENSITIVE Sensitive     CIPROFLOXACIN Value in next row Resistant      RESISTANT>=8    TETRACYCLINE Value in next row Resistant      RESISTANT>=16    VANCOMYCIN Value in next row Resistant      RESISTANT>=32    GENTAMICIN SYNERGY Value in next row Resistant      RESISTANT>=32    * ENTEROCOCCUS FAECALIS  Culture, respiratory (NON-Expectorated)     Status: None   Collection Time: 09/16/15  3:50 PM  Result Value  Ref Range Status   Specimen Description TRACHEAL ASPIRATE  Final   Special Requests Immunocompromised  Final   Gram Stain   Final    FEW WBC SEEN GOOD SPECIMEN - 80-90% WBCS RARE GRAM NEGATIVE RODS  Culture LIGHT GROWTH PSEUDOMONAS AERUGINOSA  Final   Report Status 09/19/2015 FINAL  Final   Organism ID, Bacteria PSEUDOMONAS AERUGINOSA  Final      Susceptibility   Pseudomonas aeruginosa - MIC*    CEFTAZIDIME 8 SENSITIVE Sensitive     CIPROFLOXACIN 2 INTERMEDIATE Intermediate     GENTAMICIN >=16 RESISTANT Resistant     IMIPENEM >=16 RESISTANT Resistant     PIP/TAZO Value in next row Sensitive      SENSITIVE32    CEFEPIME Value in next row Sensitive      SENSITIVE8    LEVOFLOXACIN Value in next row Resistant      RESISTANT>=8    * LIGHT GROWTH PSEUDOMONAS AERUGINOSA  Culture, expectorated sputum-assessment     Status: None   Collection Time: 09/22/15  2:09 PM  Result Value Ref Range Status   Specimen Description ENDOTRACHEAL  Final   Special Requests Normal  Final   Sputum evaluation THIS SPECIMEN IS ACCEPTABLE FOR SPUTUM CULTURE  Final   Report Status 09/24/2015 FINAL  Final  Culture, respiratory (NON-Expectorated)     Status: None   Collection Time: 09/22/15  2:09 PM  Result Value Ref Range Status   Specimen Description ENDOTRACHEAL  Final   Special Requests Normal Reflexed from [id number]  Final   Gram Stain   Final    FEW WBC SEEN FEW GRAM NEGATIVE RODS POOR SPECIMEN - LESS THAN 70% WBCS    Culture   Final    MODERATE GROWTH PSEUDOMONAS AERUGINOSA LIGHT GROWTH KLEBSIELLA PNEUMONIAE REFER TO SENSITIVITIES FROM PREVIOUS CULTURE FOR ORG 2    Report Status 10/01/2015 FINAL  Final   Organism ID, Bacteria PSEUDOMONAS AERUGINOSA  Final   Organism ID, Bacteria KLEBSIELLA PNEUMONIAE  Final      Susceptibility   Pseudomonas aeruginosa - MIC*    CEFTAZIDIME 4 SENSITIVE Sensitive     CIPROFLOXACIN 2 INTERMEDIATE Intermediate     GENTAMICIN >=16 RESISTANT Resistant      IMIPENEM >=16 RESISTANT Resistant     PIP/TAZO Value in next row Sensitive      SENSITIVE16    * MODERATE GROWTH PSEUDOMONAS AERUGINOSA  Tissue culture     Status: None   Collection Time: 09/24/15  6:44 AM  Result Value Ref Range Status   Specimen Description BONE  Final   Special Requests Normal  Final   Gram Stain MODERATE WBC SEEN FEW GRAM NEGATIVE RODS   Final   Culture   Final    MODERATE GROWTH ESCHERICHIA COLI MODERATE GROWTH KLEBSIELLA PNEUMONIAE ESBL-EXTENDED SPECTRUM BETA LACTAMASE-THE ORGANISM IS RESISTANT TO PENICILLINS, CEPHALOSPORINS AND AZTREONAM ACCORDING TO CLSI M100-S15 VOL.25 N01 JAN 2005. ORGANISM 1 This organism isolate is resistant to one or more antiotic agents in three or more antimicrobial categories.  Suggest Infectious Disease consult.   ORGANISM 2 CRITICAL RESULT CALLED TO, READ BACK BY AND VERIFIED WITH: RN Mardene Celeste LINDSAY 09/27/15 1005AM    Report Status 09/28/2015 FINAL  Final   Organism ID, Bacteria ESCHERICHIA COLI  Final   Organism ID, Bacteria KLEBSIELLA PNEUMONIAE  Final      Susceptibility   Escherichia coli - MIC*    AMPICILLIN >=32 RESISTANT Resistant     CEFTAZIDIME 4 RESISTANT Resistant     CEFAZOLIN >=64 RESISTANT Resistant     CEFTRIAXONE 8 RESISTANT Resistant     GENTAMICIN <=1 SENSITIVE Sensitive     IMIPENEM 8 RESISTANT Resistant     TRIMETH/SULFA <=20 SENSITIVE Sensitive     Extended ESBL POSITIVE Resistant  PIP/TAZO Value in next row Resistant      RESISTANT>=128    * MODERATE GROWTH ESCHERICHIA COLI   Klebsiella pneumoniae - MIC*    AMPICILLIN Value in next row Resistant      RESISTANT>=128    CEFTAZIDIME Value in next row Resistant      RESISTANT>=128    CEFAZOLIN Value in next row Resistant      RESISTANT>=128    CEFTRIAXONE Value in next row Resistant      RESISTANT>=128    CIPROFLOXACIN Value in next row Resistant      RESISTANT>=128    GENTAMICIN Value in next row Resistant      RESISTANT>=128    IMIPENEM Value in  next row Resistant      RESISTANT>=128    TRIMETH/SULFA Value in next row Sensitive      RESISTANT>=128    PIP/TAZO Value in next row Resistant      RESISTANT>=128    * MODERATE GROWTH KLEBSIELLA PNEUMONIAE  Anaerobic culture     Status: None   Collection Time: 09/24/15  3:55 PM  Result Value Ref Range Status   Specimen Description BONE  Final   Special Requests Normal  Final   Culture NO ANAEROBES ISOLATED  Final   Report Status 09/29/2015 FINAL  Final  Culture, fungus without smear     Status: None   Collection Time: 09/24/15  3:55 PM  Result Value Ref Range Status   Specimen Description BONE  Final   Special Requests Normal  Final   Culture NO FUNGUS ISOLATED AFTER 24 DAYS  Final   Report Status 10/18/2015 FINAL  Final  C difficile quick scan w PCR reflex     Status: None   Collection Time: 10/07/15  2:02 PM  Result Value Ref Range Status   C Diff antigen NEGATIVE NEGATIVE Final   C Diff toxin NEGATIVE NEGATIVE Final   C Diff interpretation Negative for C. difficile  Final  Culture, blood (routine x 2)     Status: None   Collection Time: 10/16/15  9:52 AM  Result Value Ref Range Status   Specimen Description BLOOD LEFT HAND  Final   Special Requests   Final    BOTTLES DRAWN AEROBIC AND ANAEROBIC  AER 6CC ANA 4CC   Culture NO GROWTH 6 DAYS  Final   Report Status 10/22/2015 FINAL  Final  Culture, blood (routine x 2)     Status: None   Collection Time: 10/16/15 12:25 PM  Result Value Ref Range Status   Specimen Description BLOOD LEFT HAND  Final   Special Requests BOTTLES DRAWN AEROBIC AND ANAEROBIC  5CC  Final   Culture NO GROWTH 6 DAYS  Final   Report Status 10/22/2015 FINAL  Final  Culture, expectorated sputum-assessment     Status: None   Collection Time: 10/18/15  1:15 PM  Result Value Ref Range Status   Specimen Description SPUTUM  Final   Special Requests NONE  Final   Sputum evaluation THIS SPECIMEN IS ACCEPTABLE FOR SPUTUM CULTURE  Final   Report Status  10/18/2015 FINAL  Final  Culture, respiratory (NON-Expectorated)     Status: None   Collection Time: 10/18/15  1:15 PM  Result Value Ref Range Status   Specimen Description SPUTUM  Final   Special Requests NONE Reflexed from [id number]  Final   Gram Stain   Final    GOOD SPECIMEN - 80-90% WBCS MODERATE WBC SEEN MANY GRAM NEGATIVE RODS    Culture  Final    HEAVY GROWTH PSEUDOMONAS AERUGINOSA MODERATE GROWTH KLEBSIELLA PNEUMONIAE CRITICAL RESULT CALLED TO, READ BACK BY AND VERIFIED WITHRod Mae AT 1610 10/21/15 DV KLEBSIELLA PNEUMONIAE This organism isolate is resistant to one or more antiotic agents in three or more antimicrobial categories.  Suggest Infectious Disease  consult.      Report Status 10/22/2015 FINAL  Final   Organism ID, Bacteria PSEUDOMONAS AERUGINOSA  Final   Organism ID, Bacteria KLEBSIELLA PNEUMONIAE  Final      Susceptibility   Klebsiella pneumoniae - MIC*    AMPICILLIN >=32 RESISTANT Resistant     CEFAZOLIN >=64 RESISTANT Resistant     CEFTRIAXONE >=64 RESISTANT Resistant     CIPROFLOXACIN >=4 RESISTANT Resistant     GENTAMICIN >=16 RESISTANT Resistant     IMIPENEM >=16 RESISTANT Resistant     NITROFURANTOIN >=512 RESISTANT Resistant     TRIMETH/SULFA <=20 SENSITIVE Sensitive     * MODERATE GROWTH KLEBSIELLA PNEUMONIAE   Pseudomonas aeruginosa - MIC*    CEFTAZIDIME 4 SENSITIVE Sensitive     CIPROFLOXACIN >=4 RESISTANT Resistant     GENTAMICIN 8 INTERMEDIATE Intermediate     IMIPENEM >=16 RESISTANT Resistant     PIP/TAZO Value in next row Sensitive      SENSITIVE32    * HEAVY GROWTH PSEUDOMONAS AERUGINOSA  C difficile quick scan w PCR reflex     Status: None   Collection Time: 10/18/15  4:39 PM  Result Value Ref Range Status   C Diff antigen NEGATIVE NEGATIVE Final   C Diff toxin NEGATIVE NEGATIVE Final   C Diff interpretation Negative for C. difficile  Final    Coagulation Studies: No results for input(s): LABPROT, INR in the last 72  hours.  Urinalysis: No results for input(s): COLORURINE, LABSPEC, PHURINE, GLUCOSEU, HGBUR, BILIRUBINUR, KETONESUR, PROTEINUR, UROBILINOGEN, NITRITE, LEUKOCYTESUR in the last 72 hours.  Invalid input(s): APPERANCEUR    Imaging: No results found.   Medications:   . feeding supplement (JEVITY 1.5 CAL/FIBER) 1,000 mL (10/26/15 1620)  . norepinephrine Stopped (10/08/15 0530)   . amantadine  200 mg Per Tube Q7 days  . antiseptic oral rinse  7 mL Mouth Rinse QID  . aspirin  81 mg Oral Daily  . cefTAZidime (FORTAZ)  IV  1 g Intravenous Q24H  . chlorhexidine gluconate  15 mL Mouth Rinse BID  . epoetin (EPOGEN/PROCRIT) injection  10,000 Units Intravenous Q M,W,F-HD  . escitalopram  10 mg Oral Daily  . feeding supplement (PRO-STAT SUGAR FREE 64)  30 mL Oral 6 X Daily  . folic acid  1 mg Oral Daily  . free water  20 mL Per Tube 6 times per day  . heparin subcutaneous  5,000 Units Subcutaneous Q12H  . hydrocortisone  10 mg Per Tube BID  . insulin aspart  0-9 Units Subcutaneous 6 times per day  . lidocaine  1 patch Transdermal Q24H  . loperamide  2 mg Oral BID  . midodrine  5 mg Per Tube TID WC  . multivitamin  5 mL Oral Daily  . pantoprazole sodium  40 mg Per Tube Daily  . saccharomyces boulardii  250 mg Oral BID  . sodium chloride  10-40 mL Intracatheter Q12H  . sodium hypochlorite   Irrigation BID  . sulfamethoxazole-trimethoprim  2.5 tablet Per Tube q1800  . thiamine  100 mg Per Tube Daily   sodium chloride, sodium chloride, acetaminophen (TYLENOL) oral liquid 160 mg/5 mL, HYDROmorphone (DILAUDID) injection, ipratropium-albuterol, loperamide, morphine injection, ondansetron (ZOFRAN)  IV, oxyCODONE, sodium chloride, zinc oxide  Assessment/ Plan:  49 y.o. 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, [facility] and now to [facility] Admitted on 2015/08/26  1. End-stage renal disease on hemodialysis on HD MWF. The patient has been on dialysis since October of 2014. R IJ permcath. Dialysis treatment with IV albumin for oncotic support. Midodrine and hydrocortisone ordered for blood pressure support.  -  Monitor daily for dialysis need. Dialysis for later today: albumin, midodrine and hydrocortisone for hemodynamic support.  Continue MWF schedule for now.  UF goal of 0.5 to 1 kg - tolerated well. May increase ultrafiltration goal on today's treatment since has recently remained hemodynamically stable.   2. Anemia of CKD. Hemoglobin 8 - Continue to monitor CBC  - Continue scheduled Epogen for anemia of chronic kidney disease.  3. Secondary hyperparathyroidism: PTH low at 55 (9/25). New PTH sent - Phosphorous 4.5 and acceptable. She remains off Binder therapy and activated vitamin D.    4. Sepsis: VRE in blood. Bone cultures E. Coli and klebsiella - Treatment as per ID and ICU team: daptomycin completed on 11/10 and now on trimethoprim/sulfamethoxazole. -Pseudomonas growing in the sputum. Continue ceftazidime.  5. Acute resp failure: mechanical ventilation, intubated -Continue ventilator support - Appreciate pulmonary input.    LOS: 77 Courtney [name] 12/9/20169:35 AM

## 2015-10-28 NOTE — Plan of Care (Signed)
Problem: Phase II Progression Outcomes Goal: Activity at appropriate level-compared to baseline (UP IN CHAIR FOR HEMODIALYSIS)  Outcome: Not Met (add Reason) Patient not able to tolerate being oob to chair for dialysis

## 2015-10-28 NOTE — Clinical Social Work Note (Signed)
Patient continues with same course in ICU. Nothing for CSW to offer at this time. CSW will continue to follow in the event patient becomes able to discharge from hospital.  York SpanielMonica Tarrence Enck MSW,LCSW [phone]

## 2015-10-28 NOTE — Progress Notes (Signed)
Nutrition Follow-up  DOCUMENTATION CODES:   Severe malnutrition in context of acute illness/injury  INTERVENTION:   EN: continue TF as per MD order, no further recommendations at this time   NUTRITION DIAGNOSIS:   Inadequate oral intake related to wound healing, acute illness, chronic illness as evidenced by NPO status, estimated needs.  GOAL:   Patient will meet greater than or equal to 90% of their needs  MONITOR:    (Energy Intake, Anthropometrics, Digestive System, Electrolyte/Renal Profile, Glucose Profile)  REASON FOR ASSESSMENT:   Consult Enteral/tube feeding initiation and management  ASSESSMENT:    Pt remains on vent via trach, weaning trials as tolerated, HD as scheduled, next treatment today  EN: Jevity 1.5 at 50 ml/hr, Prostat 6 packets per day  Digestive System: loose stool, no stool thus far on this shift, pt was receiving scheduled imodium BID  Skin:   (stage IV sacrum, stage III hip, unstageable foot; rectal ulcer from fleixseal, ulcer in nose from dobhoff)   Meds: MVI, ss novolog, imodium changed to prn this AM,   Last BM:  +medium BM 10/19   Electrolyte and Renal Profile:  Recent Labs Lab 10/24/15 2119 10/25/15 0500 10/26/15 1116 10/28/15 1050  BUN 71* 60* 83* 72*  CREATININE 1.10* 1.04* 1.29* 1.25*  NA 140 136 139 140  K 4.8 4.8 5.6* 5.8*  PHOS 3.9  --  4.5 4.7*   Glucose Profile:  Recent Labs  10/28/15 0028 10/28/15 0431 10/28/15 0757  GLUCAP 94 121* 115*     Height:   Ht Readings from Last 1 Encounters:  01/25/12 5\' 9"  (1.753 m)    Weight:   Wt Readings from Last 1 Encounters:  10/28/15 224 lb 13.9 oz (102 kg)   BMI:  Body mass index is 33.19 kg/(m^2).  Estimated Nutritional Needs:   Kcal:  TEE 1851 kcals/d (Ve 7.4, Tmax 37.4) Using current wt (97kg) edema noted (re-intubated)  Protein:  (1.5-2.0 g/kg)145-194 g/d but likely closer to 2.0-2.5 g/kg Using current wt of 97kg  Fluid:  1000ml + UOP  EDUCATION NEEDS:    No education needs identified at this time  LOW Care Level  Romelle Starcherate Harles Evetts MS, RD, LDN [phone] Pager

## 2015-10-28 NOTE — Progress Notes (Signed)
Occupational Therapy Treatment Patient Details Name: Courtney [name] MRN: [id number] DOB: 1966/01/17 Today's Date: 10/28/2015    History of present illness Pt is a 21 F who has been in medical facilities (hosp, LTAC, rehab) for 2 yrs following gastric bypass surgery with multiple complications. Now with chronic trach, ESRD, profound debilitation, severe sacral pressure ulcer (stage IV). Was seemingly making progress and transferred to rehab facility approx one week prior to this admission. She was sent to [facility] ED 9/23 with AMS and hypotension. Admitted with sepsis related to infected sacral ulcer.  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  Patient transferred to CCU to med-surg floor during hospitalization, but experienced profound interdialytic hypotension requiring return transfer to CCU for use of pressors (now off); remains in CCU.  Noted with R hip intertrochanteric fracture, recommended for conservative management per orthopedics.  Hospital course continues to be complicated with recurrent sepsis requiring patient on/off vent multiple times; currently on vent support with PEEP 5.  Limited ability/willingness to follow commands or actively participate in therapeutic interventions.  Per care team, physicians recommending hospice care/comfort measures at this time; husband considering goals of care/POC pending discussion with patient and other family members, but wishes continued medical care at this time. Will carefully monitor medical plan and POC to evaluate appropriateness of continued PT throughout remaining hospitalization.   OT comments  Came to check patient's hands, left hand good, right hand shoes some edema. Repositioned hand in higher elevation and gave anti-edema massage.  Follow Up Recommendations       Equipment  Recommendations       Recommendations for Other Services      Precautions / Restrictions Precautions Precautions: Fall Precaution Comments: stage 4 sacral ulcer, NG tube/NPO, trach, contact isolation, R IJ permcath, L IJ central line, log roll only (as needed for dressing changes/hygiene) and no ROM to R LE/no attempts at sitting Restrictions RLE Weight Bearing: Non weight bearing Other Position/Activity Restrictions: No ROM R LE       Mobility Bed Mobility                  Transfers                      Balance                                   ADL                                                Vision                     Perception     Praxis      Cognition   Behavior During Therapy: Flat affect Overall Cognitive Status: Difficult to assess                       Extremity/Trunk Assessment               Exercises     Shoulder Instructions       General Comments      Pertinent Vitals/ Pain  Home Living                                          Prior Functioning/Environment              Frequency       Progress Toward Goals  OT Goals(current goals can now be found in the care plan section)        Plan      Co-evaluation                 End of Session     Activity Tolerance Patient limited by lethargy (sleeping)   Patient Left     Nurse Communication          Time: [id number] OT Time Calculation (min): 10 min  Charges: OT General Charges $OT Visit: 1 Procedure OT Treatments $Therapeutic Activity: 8-22 mins  Gwyndolyn KaufmanHuff, Vika Buske M Wilma Wuthrich M Ysabel Cowgill, MS/OTR/L  10/28/2015, 4:22 PM

## 2015-10-29 LAB — GLUCOSE, CAPILLARY
GLUCOSE-CAPILLARY: 123 mg/dL — AB (ref 65–99)
GLUCOSE-CAPILLARY: 133 mg/dL — AB (ref 65–99)
Glucose-Capillary: 106 mg/dL — ABNORMAL HIGH (ref 65–99)
Glucose-Capillary: 130 mg/dL — ABNORMAL HIGH (ref 65–99)
Glucose-Capillary: 134 mg/dL — ABNORMAL HIGH (ref 65–99)
Glucose-Capillary: 146 mg/dL — ABNORMAL HIGH (ref 65–99)

## 2015-10-29 LAB — PARATHYROID HORMONE, INTACT (NO CA): PTH: 50 pg/mL (ref 15–65)

## 2015-10-29 MED ORDER — LOPERAMIDE HCL 2 MG PO CAPS
2.0000 mg | ORAL_CAPSULE | Freq: Three times a day (TID) | ORAL | Status: DC | PRN
  Administered 2015-10-29 – 2015-10-31 (×5): 2 mg via ORAL
  Filled 2015-10-29 (×5): qty 1

## 2015-10-29 NOTE — Progress Notes (Signed)
MEDICATION RELATED CONSULT NOTE - Follow up   Pharmacy Consult for Renal dosing  Allergies  Allergen Reactions  . Contrast Media [Iodinated Diagnostic Agents] Anaphylaxis  . Ampicillin Rash    Patient Measurements: Height:  (SCALE BROKE) Weight: 229 lb 4.5 oz (104 kg) IBW/kg (Calculated) : 66.2  Vital Signs: Temp: 99.1 F (37.3 C) (12/10 0800) Temp Source: Oral (12/10 0800) BP: 112/84 mmHg (12/10 0800) Pulse Rate: 92 (12/10 0800)  Recent Labs  10/26/15 1116 10/28/15 1050  WBC 9.0 8.2  HGB 8.0* 7.9*  HCT 25.8* 25.1*  PLT 215 248  CREATININE 1.29* 1.25*  PHOS 4.5 4.7*  ALBUMIN 2.2* 2.1*   Estimated Creatinine Clearance: 69.1 mL/min (by C-G formula based on Cr of 1.25).   Assessment: 49 yo patient on HD. Pharmacy consulted for renal dosing of medications.   Plan:  No medications require adjustment at present. Pharmacy will continue to monitor patients medications and dose adjust as needed.    Demetrius Charityeldrin D. Callen Vancuren, PharmD   10/29/2015

## 2015-10-29 NOTE — Progress Notes (Signed)
Physical Therapy Treatment Patient Details Name: Carianna Lague MRN: [id number] DOB: 02-07-1966 Today's Date: 10/29/2015    History of Present Illness Pt is a 34 F who has been in medical facilities (hosp, LTAC, rehab) for 2 yrs following gastric bypass surgery with multiple complications. Now with chronic trach, ESRD, profound debilitation, severe sacral pressure ulcer (stage IV). Was seemingly making progress and transferred to rehab facility approx one week prior to this admission. She was sent to [facility] ED 9/23 with AMS and hypotension. Admitted with sepsis related to infected sacral ulcer.  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  Patient transferred to CCU to med-surg floor during hospitalization, but experienced profound interdialytic hypotension requiring return transfer to CCU for use of pressors (now off); remains in CCU.  Noted with R hip intertrochanteric fracture, recommended for conservative management per orthopedics.  Hospital course continues to be complicated with recurrent sepsis requiring patient on/off vent multiple times; currently on vent support with PEEP 5.  Limited ability/willingness to follow commands or actively participate in therapeutic interventions.  Per care team, physicians recommending hospice care/comfort measures at this time; husband considering goals of care/POC pending discussion with patient and other family members, but wishes continued medical care at this time. Will carefully monitor medical plan and POC to evaluate appropriateness of continued PT throughout remaining hospitalization.    PT Comments    Pt with only brief moments of willing participation today during session, spends much of her energy shaking head "no" and looking upset.  Pt needing a lot of cuing, encouragement, education and  education between reps/sets and generally does not appear to be able to give much of any AAROM effort with exercises today.  Pt seems more limited that her typical effort today.   Follow Up Recommendations  LTACH;SNF     Equipment Recommendations       Recommendations for Other Services       Precautions / Restrictions Precautions Precautions: Fall Restrictions RLE Weight Bearing: Non weight bearing    Mobility  Bed Mobility               General bed mobility comments: Deferred/contraindicated  Transfers                    Ambulation/Gait                 Stairs            Wheelchair Mobility    Modified Rankin (Stroke Patients Only)       Balance                                    Cognition Arousal/Alertness: Lethargic Behavior During Therapy: Flat affect                        Exercises General Exercises - Lower Extremity Ankle Circles/Pumps: PROM;Left;20 reps Quad Sets: Strengthening;10 reps Gluteal Sets: Strengthening;15 reps;Left Heel Slides: PROM;15 reps;Left Hip ABduction/ADduction: 15 reps;Left;AAROM;PROM    General Comments        Pertinent Vitals/Pain Pain Assessment:  (Pt with normal indications of pain with any movement)    Home Living                      Prior  Function            PT Goals (current goals can now be found in the care plan section) Progress towards PT goals: Not progressing toward goals - comment    Frequency  Min 2X/week    PT Plan Current plan remains appropriate    Co-evaluation             End of Session   Activity Tolerance: Patient limited by pain Patient left: in bed;with family/visitor present     Time: 1235-1301 PT Time Calculation (min) (ACUTE ONLY): 26 min  Charges:  $Therapeutic Exercise: 23-37 mins                    G Codes:     Loran SentersGalen Tanija Germani, PT, DPT [phone]  Malachi ProGalen R Nevaya Nagele 10/29/2015, 2:49 PM

## 2015-10-29 NOTE — Progress Notes (Signed)
RT in room to perform vent check and attempt to place patient in PSV.  Patient shaking her head no when told she would be placed in mode letting her breath on her own.  RT asked patient about going into PSV again and patient repeatedly shaking head no, RN Huntley DecSara in room.  RT will attempt to get patient to agree to PSV later today.

## 2015-10-29 NOTE — Progress Notes (Signed)
Spoke with Dr. [name] in person about patient's red streaks in bowel movements over the previous night. Blood pressure stable, heart rate normal sinus rhythm, maintaining O2 sat 100% with ventilator support, no sign of bowel movement this morning. MD ordered CBC for tomorrow morning and to continue to monitor patient status today. MD had earlier been notified about loose bowel movements over the night after scheduled imodium discontinued. MD increased frequency of prn imodium.

## 2015-10-29 NOTE — Progress Notes (Signed)
Patient will not open her eyes when RT speaks to her regarding going into PSV.   Patient has been in severe pain, RN Huntley DecSara to give pain meds.

## 2015-10-29 NOTE — Progress Notes (Signed)
[facility] Pro[facility]  Name: Courtney GrinderHedda McMillian Monroe MRN: [id number] DOB: August 16, 1966    ADMISSION DATE:  08/11/2015   INITIAL PRESENTATION:  49 AAF who has been in medical facilities (hosp, LTAC, rehab) for 2 yrs following gastric bypass surgery with multiple complications. Now with chronic trach, ESRD, profound debilitation, severe sacral pressure ulcer. Was seemingly making progress and transferred to rehab facility approx one week prior to this admission. She was [facility] with AMS and hypotension. Working dx of severe sepsis/septic shock due to infected sacral pressure ulcer. Since admission. Her course is been very complicated with numerous complications including septic shock and GI bleeding.    INTERVAL HISTORY: Patient doing much better this morning, more alert, noted to have some mild tachypnea when placed on lower settings of pressure support yesterday. Placed back on the vent for the majority of the day and night. We will attempt pressure support again today, as planned on a daily basis. I have spoken to the husband reintroduced ID of a possible PEG tube placement in the near future, he states that she does have a complex GI anatomy and the obstacle in the past was finding a surgeon suitable to place an NG tube, at this time he again would like patient to be liberated from the vent so that she may move forward to taking oral intake.  Summary of MAJOR EVENTS/TEST RESULTS: Admission 02/07/14-05/07/14 Admission 07/21/14-09/06/14 Discharged to [facility]. Pt had palliative consult at that time, were asked to sign off by husband.  09/23 CT head: NAD 09/23 EEG: no epileptiform activity 09/23 PRBCs for Hgb 6.4 09/24 bedside debridement of sacral wound. Abscess drained 09/25 Off vasopressors. More alert. No distress. Worsening thrombocytopenia. Vanc DC'd 09/29  Dr. [name] (I.D) excused from the case by patient's husband. 10/02 MRI -multiple infarcts 10/03  tracheal bleeding- transfused platelets 10/03 hospitalist service excused from the case by patient's husband 10/03 Echocardiogram ejection fraction was 55-60%, pulmonary systolic pressure was 39 mmHg 40/9810/08 restart TF's at lower rate, attempt reg HD  10/12 Transferred to med-surg floor. Remains on PCCM service 10/14 SLP eval: pt unable to tolerate PMV adequately 10/17-will re-attempt PM valve-discussed with Speech therapist 10/18 passed swallow eval-start pureed thick foods no thin liquids-continue NG feeds 10/19 transferred to step down for sepsis/aspiration pneumonia 10/19 cxr shows RLL opacity 10/21 sacral decub debride at bedside by surgery 10/22 started back on vasopressors while on HD 10/27 CT with osteo, R hip fx, unable to identify tip of dubhoff tube - sent for fluoro study 10/28 Ortho consultation: I do not feel that she is a surgical candidate. Therefore, I feel that it would best to manage this fracture nonsurgically and allow it to heal by itself over time, which it should.  10/28 Gen Surg consultation: Due to the lack of free air or free flow of contrast and the peritoneum there is no indication for any surgical intervention on this. Would recommend pulling back the feeding tube 1-2 cm. Would recheck an abdominal film to confirm no pneumoperitoneum in the morning. Absence of any changes okay to continue using Dobbhoff for feeding and medications. 10/28 gastrograffin study: The study confirms that the feeding tube pes perforated through the duodenum and the tip is within a cavity that fills with injected contrast. The cavity does appear walled off 11/4 refuses oral feeds, continue TF's CT reviewed: T8-T9 discitis/osteomyelitis, RT HIP FRACTURE 11/5 placed back on vasopressors, placed back on Vent due to resp acidosis, levophed turned off  11/6 afternoon 11/5 PM Trach changed out due to cuff leak, #6 Shiley cuffed, 11/6 dubhoff occluded, removed and replaced, new dubhoff shows tube in  the antral stomach 11/15 refusing to take oral feeds 11/18 placed back on Vent for increased WOB,SOB. 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 10/18/2015; the patient developed a new left basal pneumonia; with recurrent sepsis.  10/19/2015; fevers resolved with IV acetaminophen. Tube feeds changed from vital to Jevity.  12/7 CXR with stable b/l opacities.    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 Pneumoniae, P. Mirabilis, VRE Wound 9/26 >> No growth Blood 9/23 >> NEG CDiff 9/27>>neg Stool Cx 10/15>> negative Cdiff 10/25>>neg Trach Aspirate 10/28>> light growth pseudomonas Blood 10/28 >> 1/2 GPC >>  Sputum cultures obtained 09/22/15 due to mucus plugging>>Pseudomonas BONE TISSUE Cx 11/3>>E.coli (ESBL), k. Pneumonia (daptomycin and septra) Bld Cx 11/27>>negative thus far.  Trach Asp 11/27>> grew out Pseudomonas, and Klebsiella pneumonia.  ANTIMICROBIALS:  Aztreonam 9/23 >> 9/24 Vanc 9/23 >> 9/25 Vanc 9/26>>9/27 Daptomycin 9/27>> 10/12, 10/28>> off Meropenem 9/23 >> 10/14, 10/28 >> 10/31 Septra 11/8>> Zosyn 11/27,>> 11/30. Levaquin 11/29>> 11/29. Ceftaz 12/1>>  VITAL SIGNS: Temp:  [98.1 F (36.7 C)-99.1 F (37.3 C)] 99.1 F (37.3 C) (12/10 0800) Pulse Rate:  [90-107] 92 (12/10 0800) Resp:  [0-34] 0 (12/10 0800) BP: (97-136)/(45-116) 112/84 mmHg (12/10 0800) SpO2:  [96 %-100 %] 100 % (12/10 0800) FiO2 (%):  [30 %] 30 % (12/10 0817) Weight:  [104 kg (229 lb 4.5 oz)] 104 kg (229 lb 4.5 oz) (12/10 0500) HEMODYNAMICS:   VENTILATOR  SETTINGS: Vent Mode:  [-] PRVC FiO2 (%):  [30 %] 30 % Set Rate:  [14 bmp] 14 bmp Vt Set:  [530 mL] 530 mL PEEP:  [5 cmH20] 5 cmH20 Pressure Support:  [12 cmH20] 12 cmH20 Plateau Pressure:  [16 cmH20-18 cmH20] 18 cmH20 INTAKE / OUTPUT:  Intake/Output Summary (Last 24 hours) at 10/29/15 0842 Last data filed at 10/29/15 0800  Gross per 24 hour  Intake   1380 ml  Output   1500 ml  Net   -120 ml   PHYSICAL EXAMINATION: Physical Examination:   VS: BP 112/84 mmHg  Pulse 92  Temp(Src) 99.1 F (37.3 C) (Oral)  Resp 0  Ht  (1.753 m)  Wt 104 kg (229 lb 4.5 oz)  BMI 33.84 kg/m2  SpO2 100%  LMP  (LMP Unknown)  General Appearance: No resp distress, coarse upper airway sounds, on vent Neuro: profoundly diffusely weak, alert today, sleeping but easily arousable HEENT: cushingoid facies, PERRLA, EOM intact Neck: trach site clean Pulmonary: clear anteriorly Cardiovascular: reg, + syst murmur Abdomen: soft, NT, +BS Extremities: warm, R>L UE edema    LABORATORY PANEL:   CBC  Recent Labs Lab 10/28/15 1050  WBC 8.2  HGB 7.9*  HCT 25.1*  PLT 248    Chemistries   Recent Labs Lab 10/28/15 1050  NA 140  K 5.8*  CL 97*  CO2 33*  GLUCOSE 152*  BUN 72*  CREATININE 1.25*  CALCIUM 8.7*  PHOS 4.7*  IMPRESSION/PLAN (current major issues):  Recurrent sepsis, due to pneumonia.   Anemia with macrocytosis -Uncertain if this is due to chronic slow GI bleed or simple critical care associated anemia. She has already receiving folate, thiamine, and multivitamin. Her anemia has not responded to oral medications, this may be due to her GI malabsorption. Will also check Vit D levels (discussed with nephrology) - PTH, 25OH, 1,25OH Vit d  Pneumonia with sepsis-better. -Chest x-ray shows new left lower lobe infiltrate,which is now improving.  Sputum cultures show pseudomonas. The patient is once again, vent dependent. She has developed again recurrent sepsis.Marland Kitchen Sputum  cultures showing Pseudomonas, and Klebsiella, currently covered with this. Ceftaz total 10 days, stop date 12/11 Weaning trial daily off MV - currently on PSV trials with plan to transition back to TCT  Worsening sacral decubitus ulcer, stage IV. -The patient is already had a sacral bone scraping, and has been placed on Septra for antibiotic, per ID recommendations. Her sacral decubitus does not appear to be improving and in fact appears to be worsening. In addition, she has several new smaller areas of tissue breakdown.  End-stage renal disease. -Continue on hemodialysis per nephrology.  Diarrhea. -This has improved since changing her loperamide 2 twice daily scheduled, and changing her tube feeds back to Jevity. Continue loperamide to when necessary.   Trach Care -changed on 11/5 to #6 Shiley due to cuff leak   Patient appears to be stable for transfer to an LTAC facility at this point in time.   Additional Chronic issues and complicating factors during this admission.  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  Thrombocytopenia, resolved - HIT panel negative 9/25 Recurrent Severe sepsis, due to sacral pressure ulcer DM 2, controlled.  Chronic steroid therapy, for a history of of adrenal insufficiency Incidental finding of R hip fx - conservative mgmt. Waxing and waning encephalopathy - she was evaluated by both the psychiatry and neurology services, and it was deemed that the patient lacks decisional capacity at this time. 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 Husband declined TEE Profound deconditioning-criticall illness neuropathy Chronic pain   Updated husband via telephone  Wells Guiles, MD [facility] On Call Pager - [phone] (please enter 7-digits)    Critical Care Attestation.  I have personally obtained a history, examined the patient, evaluated laboratory and  imaging results, formulated the assessment and plan and placed orders. The case was discussed with the critical care RN, nutrition, respiratory therapist, Associate Professor. The Patient requires high complexity decision making for assessment and support, frequent evaluation and titration of therapies, application of advanced monitoring technologies and extensive interpretation of multiple databases. The patient has critical illness that could lead imminently to failure of 1 or more organ systems and requires the highest level of physician preparedness to intervene.  Critical Care Time devoted to patient care services described in this note is 35 minutes and is exclusive of time spent in procedures.

## 2015-10-29 NOTE — Progress Notes (Signed)
Subjective:   Hemodialysis on Wednesday. Hemodialysis later today.  No acute events Patient laying in bed watching TV Remains on Vent  Objective:  Vital signs in last 24 hours:  Temp:  [98.1 F (36.7 C)-99.1 F (37.3 C)] 99.1 F (37.3 C) (12/10 0800) Pulse Rate:  [90-107] 92 (12/10 0800) Resp:  [0-34] 0 (12/10 0800) BP: (97-136)/(45-116) 112/84 mmHg (12/10 0800) SpO2:  [96 %-100 %] 100 % (12/10 0800) FiO2 (%):  [30 %] 30 % (12/10 0817) Weight:  [104 kg (229 lb 4.5 oz)] 104 kg (229 lb 4.5 oz) (12/10 0500)  Weight change: 2 kg (4 lb 6.5 oz) Filed Weights   10/28/15 0445 10/29/15 0500  Weight: 102 kg (224 lb 13.9 oz) 104 kg (229 lb 4.5 oz)    Intake/Output: I/O last 3 completed shifts: In: 2160 [NG/GT:2110; IV Piggyback:50] Out: 1500 [Other:1500]   Intake/Output this shift:  Total I/O In: 220 [NG/GT:220] Out: -   Physical Exam: General: critically ill appearing  Head:  OM moist  Eyes: anicteric  Neck: Tracheostomy in place  Lungs:  Scattered rhonchi, vent assisted    Heart: S1S2 no rubs  Abdomen:  Soft, nontender  Extremities: 3+ dependent edema, anasarca  Neurologic: Awake, following simple commands today  Access:  R IJ permcath.       Basic Metabolic Panel:  Recent Labs Lab 10/24/15 2119 10/25/15 0500 10/26/15 1116 10/28/15 1050  NA 140 136 139 140  K 4.8 4.8 5.6* 5.8*  CL 99* 96* 97* 97*  CO2 35* 33* 33* 33*  GLUCOSE 132* 126* 104* 152*  BUN 71* 60* 83* 72*  CREATININE 1.10* 1.04* 1.29* 1.25*  CALCIUM 8.3* 8.2* 8.8* 8.7*  PHOS 3.9  --  4.5 4.7*    Liver Function Tests:  Recent Labs Lab 10/24/15 2119 10/26/15 1116 10/28/15 1050  ALBUMIN 1.9* 2.2* 2.1*   No results for input(s): LIPASE, AMYLASE in the last 168 hours. No results for input(s): AMMONIA in the last 168 hours.  CBC:  Recent Labs Lab 10/24/15 2026 10/25/15 0500 10/26/15 1116 10/28/15 1050  WBC 7.4 8.0 9.0 8.2  HGB 7.6* 7.6* 8.0* 7.9*  HCT 24.9* 24.2* 25.8* 25.1*   MCV 97.3 95.8 96.1 96.0  PLT 183 195 215 248    Cardiac Enzymes: No results for input(s): CKTOTAL, CKMB, CKMBINDEX, TROPONINI in the last 168 hours.  BNP: Invalid input(s): POCBNP  CBG:  Recent Labs Lab 10/28/15 1601 10/28/15 2026 10/28/15 2349 10/29/15 0428 10/29/15 0828  GLUCAP 111* 119* 130* 123* 106*    Microbiology: Results for orders placed or performed during the hospital encounter of 02/19/15  Blood Culture (routine x 2)     Status: None   Collection Time: 02/19/15  8:51 AM  Result Value Ref Range Status   Specimen Description BLOOD Dolores HooseUNKO  Final   Special Requests BOTTLES DRAWN AEROBIC AND ANAEROBIC  3CC  Final   Culture NO GROWTH 5 DAYS  Final   Report Status 08/17/2015 FINAL  Final  Blood Culture (routine x 2)     Status: None   Collection Time: 02/19/15  9:20 AM  Result Value Ref Range Status   Specimen Description BLOOD LEFT ARM  Final   Special Requests BOTTLES DRAWN AEROBIC AND ANAEROBIC  1CC  Final   Culture NO GROWTH 5 DAYS  Final   Report Status 08/17/2015 FINAL  Final  Wound culture     Status: None   Collection Time: 02/19/15  9:20 AM  Result Value Ref Range Status  Specimen Description DECUBITIS  Final   Special Requests Normal  Final   Gram Stain   Final    FEW WBC SEEN MANY GRAM NEGATIVE RODS RARE GRAM POSITIVE COCCI    Culture   Final    HEAVY GROWTH ESCHERICHIA COLI MODERATE GROWTH ENTEROBACTER AEROGENES PROTEUS MIRABILIS HEAVY GROWTH ENTEROCOCCUS SPECIES VRE HAVE INTRINSIC RESISTANCE TO MOST COMMONLY USED ANTIBIOTICS AND THE ABILITY TO ACQUIRE RESISTANCE TO MOST AVAILABLE ANTIBIOTICS.    Report Status 08/16/2015 FINAL  Final   Organism ID, Bacteria ESCHERICHIA COLI  Final   Organism ID, Bacteria ENTEROBACTER AEROGENES  Final   Organism ID, Bacteria PROTEUS MIRABILIS  Final   Organism ID, Bacteria ENTEROCOCCUS SPECIES  Final      Susceptibility   Enterobacter aerogenes - MIC*    CEFTAZIDIME <=1 SENSITIVE Sensitive     CEFAZOLIN  >=64 RESISTANT Resistant     CEFTRIAXONE <=1 SENSITIVE Sensitive     CIPROFLOXACIN <=0.25 SENSITIVE Sensitive     GENTAMICIN <=1 SENSITIVE Sensitive     IMIPENEM 1 SENSITIVE Sensitive     TRIMETH/SULFA <=20 SENSITIVE Sensitive     * MODERATE GROWTH ENTEROBACTER AEROGENES   Escherichia coli - MIC*    AMPICILLIN <=2 SENSITIVE Sensitive     CEFTAZIDIME <=1 SENSITIVE Sensitive     CEFAZOLIN <=4 SENSITIVE Sensitive     CEFTRIAXONE <=1 SENSITIVE Sensitive     CIPROFLOXACIN <=0.25 SENSITIVE Sensitive     GENTAMICIN <=1 SENSITIVE Sensitive     IMIPENEM <=0.25 SENSITIVE Sensitive     TRIMETH/SULFA <=20 SENSITIVE Sensitive     * HEAVY GROWTH ESCHERICHIA COLI   Proteus mirabilis - MIC*    AMPICILLIN >=32 RESISTANT Resistant     CEFTAZIDIME <=1 SENSITIVE Sensitive     CEFAZOLIN 8 SENSITIVE Sensitive     CEFTRIAXONE <=1 SENSITIVE Sensitive     CIPROFLOXACIN <=0.25 SENSITIVE Sensitive     GENTAMICIN <=1 SENSITIVE Sensitive     IMIPENEM 1 SENSITIVE Sensitive     TRIMETH/SULFA <=20 SENSITIVE Sensitive     * PROTEUS MIRABILIS   Enterococcus species - MIC*    AMPICILLIN >=32 RESISTANT Resistant     VANCOMYCIN >=32 RESISTANT Resistant     GENTAMICIN SYNERGY SENSITIVE Sensitive     TETRACYCLINE Value in next row Resistant      RESISTANT>=16    * HEAVY GROWTH ENTEROCOCCUS SPECIES  MRSA PCR Screening     Status: None   Collection Time: September 10, 2015  2:38 PM  Result Value Ref Range Status   MRSA by PCR NEGATIVE NEGATIVE Final    Comment:        The GeneXpert MRSA Assay (FDA approved for NASAL specimens only), is one component of a comprehensive MRSA colonization surveillance program. It is not intended to diagnose MRSA infection nor to guide or monitor treatment for MRSA infections.   Blood culture (single)     Status: None   Collection Time: 09-10-2015  3:36 PM  Result Value Ref Range Status   Specimen Description BLOOD RIGHT ASSIST CONTROL  Final   Special Requests BOTTLES DRAWN AEROBIC AND  ANAEROBIC  Final   Culture NO GROWTH 5 DAYS  Final   Report Status 08/17/2015 FINAL  Final  Wound culture     Status: None   Collection Time: 08/13/15 12:37 PM  Result Value Ref Range Status   Specimen Description WOUND  Final   Special Requests Normal  Final   Gram Stain   Final    FEW WBC SEEN TOO  NUMEROUS TO COUNT GRAM NEGATIVE RODS FEW GRAM POSITIVE COCCI    Culture   Final    HEAVY GROWTH ESCHERICHIA COLI MODERATE GROWTH PROTEUS MIRABILIS LIGHT GROWTH KLEBSIELLA PNEUMONIAE MODERATE GROWTH ENTEROCOCCUS GALLINARUM CRITICAL RESULT CALLED TO, READ BACK BY AND VERIFIED WITH: [facility] BORBA AT 1042 08/16/15 DV    Report Status 08/17/2015 FINAL  Final   Organism ID, Bacteria ESCHERICHIA COLI  Final   Organism ID, Bacteria PROTEUS MIRABILIS  Final   Organism ID, Bacteria KLEBSIELLA PNEUMONIAE  Final   Organism ID, Bacteria ENTEROCOCCUS GALLINARUM  Final      Susceptibility   Escherichia coli - MIC*    AMPICILLIN >=32 RESISTANT Resistant     CEFTAZIDIME 4 RESISTANT Resistant     CEFAZOLIN >=64 RESISTANT Resistant     CEFTRIAXONE 16 RESISTANT Resistant     GENTAMICIN 2 SENSITIVE Sensitive     IMIPENEM >=16 RESISTANT Resistant     TRIMETH/SULFA <=20 SENSITIVE Sensitive     Extended ESBL POSITIVE Resistant     PIP/TAZO Value in next row Resistant      RESISTANT>=128    CIPROFLOXACIN Value in next row Sensitive      SENSITIVE<=0.25    * HEAVY GROWTH ESCHERICHIA COLI   Klebsiella pneumoniae - MIC*    AMPICILLIN Value in next row Resistant      SENSITIVE<=0.25    CEFTAZIDIME Value in next row Resistant      SENSITIVE<=0.25    CEFAZOLIN Value in next row Resistant      SENSITIVE<=0.25    CEFTRIAXONE Value in next row Resistant      SENSITIVE<=0.25    CIPROFLOXACIN Value in next row Resistant      SENSITIVE<=0.25    GENTAMICIN Value in next row Sensitive      SENSITIVE<=0.25    IMIPENEM Value in next row Resistant      SENSITIVE<=0.25    TRIMETH/SULFA Value in next row  Resistant      SENSITIVE<=0.25    PIP/TAZO Value in next row Resistant      RESISTANT>=128    * LIGHT GROWTH KLEBSIELLA PNEUMONIAE   Proteus mirabilis - MIC*    AMPICILLIN Value in next row Resistant      RESISTANT>=128    CEFTAZIDIME Value in next row Sensitive      RESISTANT>=128    CEFAZOLIN Value in next row Sensitive      RESISTANT>=128    CEFTRIAXONE Value in next row Sensitive      RESISTANT>=128    CIPROFLOXACIN Value in next row Sensitive      RESISTANT>=128    GENTAMICIN Value in next row Sensitive      RESISTANT>=128    IMIPENEM Value in next row Sensitive      RESISTANT>=128    TRIMETH/SULFA Value in next row Sensitive      RESISTANT>=128    PIP/TAZO Value in next row Sensitive      SENSITIVE<=4    * MODERATE GROWTH PROTEUS MIRABILIS   Enterococcus gallinarum - MIC*    AMPICILLIN Value in next row Resistant      SENSITIVE<=4    GENTAMICIN SYNERGY Value in next row Sensitive      SENSITIVE<=4    CIPROFLOXACIN Value in next row Resistant      RESISTANT>=8    TETRACYCLINE Value in next row Resistant      RESISTANT>=16    * MODERATE GROWTH ENTEROCOCCUS GALLINARUM  Wound culture     Status: None   Collection Time: 08/15/15  2:53 PM  Result Value Ref Range Status   Specimen Description WOUND  Final   Special Requests NONE  Final   Gram Stain FEW WBC SEEN NO ORGANISMS SEEN   Final   Culture NO GROWTH 3 DAYS  Final   Report Status 08/18/2015 FINAL  Final  C difficile quick scan w PCR reflex     Status: None   Collection Time: 08/17/15 11:34 AM  Result Value Ref Range Status   C Diff antigen NEGATIVE NEGATIVE Final   C Diff toxin NEGATIVE NEGATIVE Final   C Diff interpretation Negative for C. difficile  Final  Stool culture     Status: None   Collection Time: 09/03/15  3:51 PM  Result Value Ref Range Status   Specimen Description STOOL  Final   Special Requests Immunocompromised  Final   Culture   Final    NO SALMONELLA OR SHIGELLA ISOLATED No Pathogenic  E. coli detected NO CAMPYLOBACTER DETECTED    Report Status 09/07/2015 FINAL  Final  C difficile quick scan w PCR reflex     Status: None   Collection Time: 09/13/15 12:51 AM  Result Value Ref Range Status   C Diff antigen NEGATIVE NEGATIVE Final   C Diff toxin NEGATIVE NEGATIVE Final   C Diff interpretation Negative for C. difficile  Final  Culture, blood (routine x 2)     Status: None   Collection Time: 09/16/15 11:24 AM  Result Value Ref Range Status   Specimen Description BLOOD LEFT HAND  Final   Special Requests BOTTLES DRAWN AEROBIC AND ANAEROBIC  1CC  Final   Culture NO GROWTH 5 DAYS  Final   Report Status 09/21/2015 FINAL  Final  Culture, blood (routine x 2)     Status: None   Collection Time: 09/16/15 12:23 PM  Result Value Ref Range Status   Specimen Description BLOOD RIGHT HAND  Final   Special Requests BOTTLES DRAWN AEROBIC AND ANAEROBIC  1CC  Final   Culture  Setup Time   Final    GRAM POSITIVE COCCI AEROBIC BOTTLE ONLY CRITICAL RESULT CALLED TO, READ BACK BY AND VERIFIED WITH: TESS THOMAS,RN 09/17/2015 0631 BY JRS.    Culture   Final    ENTEROCOCCUS FAECALIS AEROBIC BOTTLE ONLY VRE HAVE INTRINSIC RESISTANCE TO MOST COMMONLY USED ANTIBIOTICS AND THE ABILITY TO ACQUIRE RESISTANCE TO MOST AVAILABLE ANTIBIOTICS. CRITICAL RESULT CALLED TO, READ BACK BY AND VERIFIED WITH: CHERYL SMITH AT 4098 09/19/15 DV    Report Status 09/21/2015 FINAL  Final   Organism ID, Bacteria ENTEROCOCCUS FAECALIS  Final      Susceptibility   Enterococcus faecalis - MIC*    AMPICILLIN <=2 SENSITIVE Sensitive     LINEZOLID 2 SENSITIVE Sensitive     CIPROFLOXACIN Value in next row Resistant      RESISTANT>=8    TETRACYCLINE Value in next row Resistant      RESISTANT>=16    VANCOMYCIN Value in next row Resistant      RESISTANT>=32    GENTAMICIN SYNERGY Value in next row Resistant      RESISTANT>=32    * ENTEROCOCCUS FAECALIS  Culture, respiratory (NON-Expectorated)     Status: None    Collection Time: 09/16/15  3:50 PM  Result Value Ref Range Status   Specimen Description TRACHEAL ASPIRATE  Final   Special Requests Immunocompromised  Final   Gram Stain   Final    FEW WBC SEEN GOOD SPECIMEN - 80-90% WBCS RARE GRAM NEGATIVE RODS  Culture LIGHT GROWTH PSEUDOMONAS AERUGINOSA  Final   Report Status 09/19/2015 FINAL  Final   Organism ID, Bacteria PSEUDOMONAS AERUGINOSA  Final      Susceptibility   Pseudomonas aeruginosa - MIC*    CEFTAZIDIME 8 SENSITIVE Sensitive     CIPROFLOXACIN 2 INTERMEDIATE Intermediate     GENTAMICIN >=16 RESISTANT Resistant     IMIPENEM >=16 RESISTANT Resistant     PIP/TAZO Value in next row Sensitive      SENSITIVE32    CEFEPIME Value in next row Sensitive      SENSITIVE8    LEVOFLOXACIN Value in next row Resistant      RESISTANT>=8    * LIGHT GROWTH PSEUDOMONAS AERUGINOSA  Culture, expectorated sputum-assessment     Status: None   Collection Time: 09/22/15  2:09 PM  Result Value Ref Range Status   Specimen Description ENDOTRACHEAL  Final   Special Requests Normal  Final   Sputum evaluation THIS SPECIMEN IS ACCEPTABLE FOR SPUTUM CULTURE  Final   Report Status 09/24/2015 FINAL  Final  Culture, respiratory (NON-Expectorated)     Status: None   Collection Time: 09/22/15  2:09 PM  Result Value Ref Range Status   Specimen Description ENDOTRACHEAL  Final   Special Requests Normal Reflexed from [id number]  Final   Gram Stain   Final    FEW WBC SEEN FEW GRAM NEGATIVE RODS POOR SPECIMEN - LESS THAN 70% WBCS    Culture   Final    MODERATE GROWTH PSEUDOMONAS AERUGINOSA LIGHT GROWTH KLEBSIELLA PNEUMONIAE REFER TO SENSITIVITIES FROM PREVIOUS CULTURE FOR ORG 2    Report Status 10/01/2015 FINAL  Final   Organism ID, Bacteria PSEUDOMONAS AERUGINOSA  Final   Organism ID, Bacteria KLEBSIELLA PNEUMONIAE  Final      Susceptibility   Pseudomonas aeruginosa - MIC*    CEFTAZIDIME 4 SENSITIVE Sensitive     CIPROFLOXACIN 2 INTERMEDIATE Intermediate      GENTAMICIN >=16 RESISTANT Resistant     IMIPENEM >=16 RESISTANT Resistant     PIP/TAZO Value in next row Sensitive      SENSITIVE16    * MODERATE GROWTH PSEUDOMONAS AERUGINOSA  Tissue culture     Status: None   Collection Time: 09/24/15  6:44 AM  Result Value Ref Range Status   Specimen Description BONE  Final   Special Requests Normal  Final   Gram Stain MODERATE WBC SEEN FEW GRAM NEGATIVE RODS   Final   Culture   Final    MODERATE GROWTH ESCHERICHIA COLI MODERATE GROWTH KLEBSIELLA PNEUMONIAE ESBL-EXTENDED SPECTRUM BETA LACTAMASE-THE ORGANISM IS RESISTANT TO PENICILLINS, CEPHALOSPORINS AND AZTREONAM ACCORDING TO CLSI M100-S15 VOL.25 N01 JAN 2005. ORGANISM 1 This organism isolate is resistant to one or more antiotic agents in three or more antimicrobial categories.  Suggest Infectious Disease consult.   ORGANISM 2 CRITICAL RESULT CALLED TO, READ BACK BY AND VERIFIED WITH: RN Mardene Celeste LINDSAY 09/27/15 1005AM    Report Status 09/28/2015 FINAL  Final   Organism ID, Bacteria ESCHERICHIA COLI  Final   Organism ID, Bacteria KLEBSIELLA PNEUMONIAE  Final      Susceptibility   Escherichia coli - MIC*    AMPICILLIN >=32 RESISTANT Resistant     CEFTAZIDIME 4 RESISTANT Resistant     CEFAZOLIN >=64 RESISTANT Resistant     CEFTRIAXONE 8 RESISTANT Resistant     GENTAMICIN <=1 SENSITIVE Sensitive     IMIPENEM 8 RESISTANT Resistant     TRIMETH/SULFA <=20 SENSITIVE Sensitive     Extended ESBL POSITIVE Resistant  PIP/TAZO Value in next row Resistant      RESISTANT>=128    * MODERATE GROWTH ESCHERICHIA COLI   Klebsiella pneumoniae - MIC*    AMPICILLIN Value in next row Resistant      RESISTANT>=128    CEFTAZIDIME Value in next row Resistant      RESISTANT>=128    CEFAZOLIN Value in next row Resistant      RESISTANT>=128    CEFTRIAXONE Value in next row Resistant      RESISTANT>=128    CIPROFLOXACIN Value in next row Resistant      RESISTANT>=128    GENTAMICIN Value in next row Resistant       RESISTANT>=128    IMIPENEM Value in next row Resistant      RESISTANT>=128    TRIMETH/SULFA Value in next row Sensitive      RESISTANT>=128    PIP/TAZO Value in next row Resistant      RESISTANT>=128    * MODERATE GROWTH KLEBSIELLA PNEUMONIAE  Anaerobic culture     Status: None   Collection Time: 09/24/15  3:55 PM  Result Value Ref Range Status   Specimen Description BONE  Final   Special Requests Normal  Final   Culture NO ANAEROBES ISOLATED  Final   Report Status 09/29/2015 FINAL  Final  Culture, fungus without smear     Status: None   Collection Time: 09/24/15  3:55 PM  Result Value Ref Range Status   Specimen Description BONE  Final   Special Requests Normal  Final   Culture NO FUNGUS ISOLATED AFTER 24 DAYS  Final   Report Status 10/18/2015 FINAL  Final  C difficile quick scan w PCR reflex     Status: None   Collection Time: 10/07/15  2:02 PM  Result Value Ref Range Status   C Diff antigen NEGATIVE NEGATIVE Final   C Diff toxin NEGATIVE NEGATIVE Final   C Diff interpretation Negative for C. difficile  Final  Culture, blood (routine x 2)     Status: None   Collection Time: 10/16/15  9:52 AM  Result Value Ref Range Status   Specimen Description BLOOD LEFT HAND  Final   Special Requests   Final    BOTTLES DRAWN AEROBIC AND ANAEROBIC  AER 6CC ANA 4CC   Culture NO GROWTH 6 DAYS  Final   Report Status 10/22/2015 FINAL  Final  Culture, blood (routine x 2)     Status: None   Collection Time: 10/16/15 12:25 PM  Result Value Ref Range Status   Specimen Description BLOOD LEFT HAND  Final   Special Requests BOTTLES DRAWN AEROBIC AND ANAEROBIC  5CC  Final   Culture NO GROWTH 6 DAYS  Final   Report Status 10/22/2015 FINAL  Final  Culture, expectorated sputum-assessment     Status: None   Collection Time: 10/18/15  1:15 PM  Result Value Ref Range Status   Specimen Description SPUTUM  Final   Special Requests NONE  Final   Sputum evaluation THIS SPECIMEN IS ACCEPTABLE FOR  SPUTUM CULTURE  Final   Report Status 10/18/2015 FINAL  Final  Culture, respiratory (NON-Expectorated)     Status: None   Collection Time: 10/18/15  1:15 PM  Result Value Ref Range Status   Specimen Description SPUTUM  Final   Special Requests NONE Reflexed from [id number]  Final   Gram Stain   Final    GOOD SPECIMEN - 80-90% WBCS MODERATE WBC SEEN MANY GRAM NEGATIVE RODS    Culture  Final    HEAVY GROWTH PSEUDOMONAS AERUGINOSA MODERATE GROWTH KLEBSIELLA PNEUMONIAE CRITICAL RESULT CALLED TO, READ BACK BY AND VERIFIED WITHRod Mae AT 4540 10/21/15 DV KLEBSIELLA PNEUMONIAE This organism isolate is resistant to one or more antiotic agents in three or more antimicrobial categories.  Suggest Infectious Disease  consult.      Report Status 10/22/2015 FINAL  Final   Organism ID, Bacteria PSEUDOMONAS AERUGINOSA  Final   Organism ID, Bacteria KLEBSIELLA PNEUMONIAE  Final      Susceptibility   Klebsiella pneumoniae - MIC*    AMPICILLIN >=32 RESISTANT Resistant     CEFAZOLIN >=64 RESISTANT Resistant     CEFTRIAXONE >=64 RESISTANT Resistant     CIPROFLOXACIN >=4 RESISTANT Resistant     GENTAMICIN >=16 RESISTANT Resistant     IMIPENEM >=16 RESISTANT Resistant     NITROFURANTOIN >=512 RESISTANT Resistant     TRIMETH/SULFA <=20 SENSITIVE Sensitive     * MODERATE GROWTH KLEBSIELLA PNEUMONIAE   Pseudomonas aeruginosa - MIC*    CEFTAZIDIME 4 SENSITIVE Sensitive     CIPROFLOXACIN >=4 RESISTANT Resistant     GENTAMICIN 8 INTERMEDIATE Intermediate     IMIPENEM >=16 RESISTANT Resistant     PIP/TAZO Value in next row Sensitive      SENSITIVE32    * HEAVY GROWTH PSEUDOMONAS AERUGINOSA  C difficile quick scan w PCR reflex     Status: None   Collection Time: 10/18/15  4:39 PM  Result Value Ref Range Status   C Diff antigen NEGATIVE NEGATIVE Final   C Diff toxin NEGATIVE NEGATIVE Final   C Diff interpretation Negative for C. difficile  Final    Coagulation Studies: No results for input(s):  LABPROT, INR in the last 72 hours.  Urinalysis: No results for input(s): COLORURINE, LABSPEC, PHURINE, GLUCOSEU, HGBUR, BILIRUBINUR, KETONESUR, PROTEINUR, UROBILINOGEN, NITRITE, LEUKOCYTESUR in the last 72 hours.  Invalid input(s): APPERANCEUR    Imaging: No results found.   Medications:   . feeding supplement (JEVITY 1.5 CAL/FIBER) 1,000 mL (10/29/15 0813)  . norepinephrine Stopped (10/08/15 0530)   . amantadine  200 mg Per Tube Q7 days  . antiseptic oral rinse  7 mL Mouth Rinse QID  . aspirin  81 mg Oral Daily  . cefTAZidime (FORTAZ)  IV  1 g Intravenous Q24H  . chlorhexidine gluconate  15 mL Mouth Rinse BID  . epoetin (EPOGEN/PROCRIT) injection  10,000 Units Intravenous Q M,W,F-HD  . escitalopram  10 mg Oral Daily  . feeding supplement (PRO-STAT SUGAR FREE 64)  30 mL Oral 6 X Daily  . folic acid  1 mg Oral Daily  . free water  20 mL Per Tube 6 times per day  . heparin subcutaneous  5,000 Units Subcutaneous Q12H  . hydrocortisone  10 mg Per Tube BID  . insulin aspart  0-9 Units Subcutaneous 6 times per day  . lidocaine  1 patch Transdermal Q24H  . midodrine  5 mg Per Tube TID WC  . multivitamin  5 mL Oral Daily  . pantoprazole sodium  40 mg Per Tube Daily  . saccharomyces boulardii  250 mg Oral BID  . sodium chloride  10-40 mL Intracatheter Q12H  . sodium hypochlorite   Irrigation BID  . sulfamethoxazole-trimethoprim  2.5 tablet Per Tube q1800  . thiamine  100 mg Per Tube Daily   sodium chloride, sodium chloride, acetaminophen (TYLENOL) oral liquid 160 mg/5 mL, HYDROmorphone (DILAUDID) injection, ipratropium-albuterol, loperamide, morphine injection, ondansetron (ZOFRAN) IV, oxyCODONE, sodium chloride, zinc oxide  Assessment/  Plan:  49 y.o. 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 Admitted on August 18, 2015  1. End-stage renal disease on hemodialysis on HD MWF. The patient has been on dialysis since October of 2014. R IJ permcath. Dialysis treatment with IV albumin for oncotic support. Midodrine and hydrocortisone ordered for blood pressure support.  -  Monitor daily for dialysis need. Dialysis for later today: albumin, midodrine and hydrocortisone for hemodynamic support.    - Continue MWF schedule for now.  UF goal of 0.5 to 1 kg -  As tolerated - next HD on Monday.   2. Anemia of CKD. Hemoglobin 7.9 - Continue to monitor CBC  - Continue scheduled Epogen for anemia of chronic kidney disease.  3. Secondary hyperparathyroidism: PTH low at 55 (9/25). New PTH sent - Phosphorous 4.7 and acceptable. She remains off Binder therapy and activated vitamin D.    4. Sepsis: VRE in blood. Bone cultures E. Coli and klebsiella - Treatment as per ID and ICU team: daptomycin completed on 11/10 and now on high dose trimethoprim/sulfamethoxazole.   5. Acute resp failure:  Remains vent dependent   6. Generalized edema - Fluid removal with dialysis is limited due to hypotension   LOS: 78 Kamir Selover 12/10/20169:59 AM

## 2015-10-29 NOTE — Progress Notes (Signed)
ANTIBIOTIC CONSULT NOTE - Follow up  Pharmacy Consult for Ceftazidime  Indication: pneumonia  Allergies  Allergen Reactions  . Contrast Media [Iodinated Diagnostic Agents] Anaphylaxis  . Ampicillin Rash   Patient Measurements: Height:  (SCALE BROKE) Weight: 229 lb 4.5 oz (104 kg) IBW/kg (Calculated) : 66.2  Vital Signs: Temp: 99.1 F (37.3 C) (12/10 0800) Temp Source: Oral (12/10 0800) BP: 112/84 mmHg (12/10 0800) Pulse Rate: 92 (12/10 0800) Intake/Output from previous day: 12/09 0701 - 12/10 0700 In: 1310 [NG/GT:1260; IV Piggyback:50] Out: 1500    Recent Labs  10/26/15 1116 10/28/15 1050  WBC 9.0 8.2  HGB 8.0* 7.9*  PLT 215 248  CREATININE 1.29* 1.25*   Estimated Creatinine Clearance: 69.1 mL/min (by C-G formula based on Cr of 1.25).   Microbiology: Recent Results (from the past 720 hour(s))  C difficile quick scan w PCR reflex     Status: None   Collection Time: 10/07/15  2:02 PM  Result Value Ref Range Status   C Diff antigen NEGATIVE NEGATIVE Final   C Diff toxin NEGATIVE NEGATIVE Final   C Diff interpretation Negative for C. difficile  Final  Culture, blood (routine x 2)     Status: None   Collection Time: 10/16/15  9:52 AM  Result Value Ref Range Status   Specimen Description BLOOD LEFT HAND  Final   Special Requests   Final    BOTTLES DRAWN AEROBIC AND ANAEROBIC  AER 6CC ANA 4CC   Culture NO GROWTH 6 DAYS  Final   Report Status 10/22/2015 FINAL  Final  Culture, blood (routine x 2)     Status: None   Collection Time: 10/16/15 12:25 PM  Result Value Ref Range Status   Specimen Description BLOOD LEFT HAND  Final   Special Requests BOTTLES DRAWN AEROBIC AND ANAEROBIC  5CC  Final   Culture NO GROWTH 6 DAYS  Final   Report Status 10/22/2015 FINAL  Final  Culture, expectorated sputum-assessment     Status: None   Collection Time: 10/18/15  1:15 PM  Result Value Ref Range Status   Specimen Description SPUTUM  Final   Special Requests NONE  Final    Sputum evaluation THIS SPECIMEN IS ACCEPTABLE FOR SPUTUM CULTURE  Final   Report Status 10/18/2015 FINAL  Final  Culture, respiratory (NON-Expectorated)     Status: None   Collection Time: 10/18/15  1:15 PM  Result Value Ref Range Status   Specimen Description SPUTUM  Final   Special Requests NONE Reflexed from [id number]  Final   Gram Stain   Final    GOOD SPECIMEN - 80-90% WBCS MODERATE WBC SEEN MANY GRAM NEGATIVE RODS    Culture   Final    HEAVY GROWTH PSEUDOMONAS AERUGINOSA MODERATE GROWTH KLEBSIELLA PNEUMONIAE CRITICAL RESULT CALLED TO, READ BACK BY AND VERIFIED WITHRod Mae AT 9147 10/21/15 DV KLEBSIELLA PNEUMONIAE This organism isolate is resistant to one or more antiotic agents in three or more antimicrobial categories.  Suggest Infectious Disease  consult.      Report Status 10/22/2015 FINAL  Final   Organism ID, Bacteria PSEUDOMONAS AERUGINOSA  Final   Organism ID, Bacteria KLEBSIELLA PNEUMONIAE  Final      Susceptibility   Klebsiella pneumoniae - MIC*    AMPICILLIN >=32 RESISTANT Resistant     CEFAZOLIN >=64 RESISTANT Resistant     CEFTRIAXONE >=64 RESISTANT Resistant     CIPROFLOXACIN >=4 RESISTANT Resistant     GENTAMICIN >=16 RESISTANT Resistant     IMIPENEM >=  16 RESISTANT Resistant     NITROFURANTOIN >=512 RESISTANT Resistant     TRIMETH/SULFA <=20 SENSITIVE Sensitive     * MODERATE GROWTH KLEBSIELLA PNEUMONIAE   Pseudomonas aeruginosa - MIC*    CEFTAZIDIME 4 SENSITIVE Sensitive     CIPROFLOXACIN >=4 RESISTANT Resistant     GENTAMICIN 8 INTERMEDIATE Intermediate     IMIPENEM >=16 RESISTANT Resistant     PIP/TAZO Value in next row Sensitive      SENSITIVE32    * HEAVY GROWTH PSEUDOMONAS AERUGINOSA  C difficile quick scan w PCR reflex     Status: None   Collection Time: 10/18/15  4:39 PM  Result Value Ref Range Status   C Diff antigen NEGATIVE NEGATIVE Final   C Diff toxin NEGATIVE NEGATIVE Final   C Diff interpretation Negative for C. difficile  Final    Assessment: 49 yo female with ESRD receiving HD every MWF. Pharmacy consulted for dosing and monitoring of ceftazidime.   Patients sputum cultures showing heavy growth of Pseudomonas and Klebsiella.  Suspectibilites show Pseudomonas is sensitive to Ceftazidime and Klebsiella is sensitive to Septra which is currently on board as well.   Plan:  Will continue patient on Ceftazidime 1g every 24 hours for HD dosing through 12/11.  Pharmacy will continue to monitor labs.  Demetrius Charityeldrin D. Oumar Marcott, PharmD   10/29/2015 10:42 AM

## 2015-10-30 LAB — CBC
HEMATOCRIT: 24.6 % — AB (ref 35.0–47.0)
Hemoglobin: 7.7 g/dL — ABNORMAL LOW (ref 12.0–16.0)
MCH: 30.4 pg (ref 26.0–34.0)
MCHC: 31.3 g/dL — ABNORMAL LOW (ref 32.0–36.0)
MCV: 97.4 fL (ref 80.0–100.0)
Platelets: 249 10*3/uL (ref 150–440)
RBC: 2.53 MIL/uL — ABNORMAL LOW (ref 3.80–5.20)
RDW: 17.5 % — ABNORMAL HIGH (ref 11.5–14.5)
WBC: 8.1 10*3/uL (ref 3.6–11.0)

## 2015-10-30 LAB — GLUCOSE, CAPILLARY
GLUCOSE-CAPILLARY: 111 mg/dL — AB (ref 65–99)
GLUCOSE-CAPILLARY: 114 mg/dL — AB (ref 65–99)
GLUCOSE-CAPILLARY: 117 mg/dL — AB (ref 65–99)
Glucose-Capillary: 114 mg/dL — ABNORMAL HIGH (ref 65–99)
Glucose-Capillary: 145 mg/dL — ABNORMAL HIGH (ref 65–99)
Glucose-Capillary: 116 mg/dL — ABNORMAL HIGH (ref 65–99)

## 2015-10-30 LAB — POTASSIUM: POTASSIUM: 5.6 mmol/L — AB (ref 3.5–5.1)

## 2015-10-30 MED ORDER — ALBUMIN HUMAN 25 % IV SOLN
25.0000 g | Freq: Once | INTRAVENOUS | Status: AC
Start: 2015-10-31 — End: 2015-10-31
  Administered 2015-10-31: 25 g via INTRAVENOUS
  Filled 2015-10-30 (×5): qty 100

## 2015-10-30 MED ORDER — ALBUMIN HUMAN 25 % IV SOLN
25.0000 g | Freq: Once | INTRAVENOUS | Status: DC
  Filled 2015-10-30: qty 100

## 2015-10-30 NOTE — Progress Notes (Signed)
ANTIBIOTIC CONSULT NOTE - Follow up  Pharmacy Consult for Ceftazidime  Indication: pneumonia  Allergies  Allergen Reactions  . Contrast Media [Iodinated Diagnostic Agents] Anaphylaxis  . Ampicillin Rash   Patient Measurements: Height:  (SCALE BROKE) Weight: 229 lb 4.5 oz (104 kg) IBW/kg (Calculated) : 66.2  Vital Signs: Temp: 99.8 F (37.7 C) (12/11 0500) Temp Source: Oral (12/11 0500) BP: 123/70 mmHg (12/11 0600) Pulse Rate: 94 (12/11 0600) Intake/Output from previous day: 12/10 0701 - 12/11 0700 In: 1740 [NG/GT:1690; IV Piggyback:50] Out: -    Recent Labs  10/28/15 1050 10/30/15 0500  WBC 8.2 8.1  HGB 7.9* 7.7*  PLT 248 249  CREATININE 1.25*  --    Estimated Creatinine Clearance: 69.1 mL/min (by C-G formula based on Cr of 1.25).   Microbiology: Recent Results (from the past 720 hour(s))  C difficile quick scan w PCR reflex     Status: None   Collection Time: 10/07/15  2:02 PM  Result Value Ref Range Status   C Diff antigen NEGATIVE NEGATIVE Final   C Diff toxin NEGATIVE NEGATIVE Final   C Diff interpretation Negative for C. difficile  Final  Culture, blood (routine x 2)     Status: None   Collection Time: 10/16/15  9:52 AM  Result Value Ref Range Status   Specimen Description BLOOD LEFT HAND  Final   Special Requests   Final    BOTTLES DRAWN AEROBIC AND ANAEROBIC  AER 6CC ANA 4CC   Culture NO GROWTH 6 DAYS  Final   Report Status 10/22/2015 FINAL  Final  Culture, blood (routine x 2)     Status: None   Collection Time: 10/16/15 12:25 PM  Result Value Ref Range Status   Specimen Description BLOOD LEFT HAND  Final   Special Requests BOTTLES DRAWN AEROBIC AND ANAEROBIC  5CC  Final   Culture NO GROWTH 6 DAYS  Final   Report Status 10/22/2015 FINAL  Final  Culture, expectorated sputum-assessment     Status: None   Collection Time: 10/18/15  1:15 PM  Result Value Ref Range Status   Specimen Description SPUTUM  Final   Special Requests NONE  Final   Sputum  evaluation THIS SPECIMEN IS ACCEPTABLE FOR SPUTUM CULTURE  Final   Report Status 10/18/2015 FINAL  Final  Culture, respiratory (NON-Expectorated)     Status: None   Collection Time: 10/18/15  1:15 PM  Result Value Ref Range Status   Specimen Description SPUTUM  Final   Special Requests NONE Reflexed from [id number]  Final   Gram Stain   Final    GOOD SPECIMEN - 80-90% WBCS MODERATE WBC SEEN MANY GRAM NEGATIVE RODS    Culture   Final    HEAVY GROWTH PSEUDOMONAS AERUGINOSA MODERATE GROWTH KLEBSIELLA PNEUMONIAE CRITICAL RESULT CALLED TO, READ BACK BY AND VERIFIED WITHRod Mae AT 1610 10/21/15 DV KLEBSIELLA PNEUMONIAE This organism isolate is resistant to one or more antiotic agents in three or more antimicrobial categories.  Suggest Infectious Disease  consult.      Report Status 10/22/2015 FINAL  Final   Organism ID, Bacteria PSEUDOMONAS AERUGINOSA  Final   Organism ID, Bacteria KLEBSIELLA PNEUMONIAE  Final      Susceptibility   Klebsiella pneumoniae - MIC*    AMPICILLIN >=32 RESISTANT Resistant     CEFAZOLIN >=64 RESISTANT Resistant     CEFTRIAXONE >=64 RESISTANT Resistant     CIPROFLOXACIN >=4 RESISTANT Resistant     GENTAMICIN >=16 RESISTANT Resistant  IMIPENEM >=16 RESISTANT Resistant     NITROFURANTOIN >=512 RESISTANT Resistant     TRIMETH/SULFA <=20 SENSITIVE Sensitive     * MODERATE GROWTH KLEBSIELLA PNEUMONIAE   Pseudomonas aeruginosa - MIC*    CEFTAZIDIME 4 SENSITIVE Sensitive     CIPROFLOXACIN >=4 RESISTANT Resistant     GENTAMICIN 8 INTERMEDIATE Intermediate     IMIPENEM >=16 RESISTANT Resistant     PIP/TAZO Value in next row Sensitive      SENSITIVE32    * HEAVY GROWTH PSEUDOMONAS AERUGINOSA  C difficile quick scan w PCR reflex     Status: None   Collection Time: 10/18/15  4:39 PM  Result Value Ref Range Status   C Diff antigen NEGATIVE NEGATIVE Final   C Diff toxin NEGATIVE NEGATIVE Final   C Diff interpretation Negative for C. difficile  Final    Assessment: 49 yo female with ESRD receiving HD every MWF. Pharmacy consulted for dosing and monitoring of ceftazidime.   Patients sputum cultures showing heavy growth of Pseudomonas and Klebsiella.  Suspectibilites show Pseudomonas is sensitive to Ceftazidime and Klebsiella is sensitive to Septra which is currently on board as well.   Plan:  Will continue patient on Ceftazidime 1g every 24 hours for HD dosing through 12/11.  Pharmacy will continue to monitor labs.  Demetrius Charityeldrin D. Bria Sparr, PharmD   10/30/2015 7:55 AM

## 2015-10-30 NOTE — Progress Notes (Signed)
MEDICATION RELATED CONSULT NOTE - Follow up   Pharmacy Consult for Renal dosing  Allergies  Allergen Reactions  . Contrast Media [Iodinated Diagnostic Agents] Anaphylaxis  . Ampicillin Rash    Patient Measurements: Height:  (SCALE BROKE) Weight: 229 lb 4.5 oz (104 kg) IBW/kg (Calculated) : 66.2  Vital Signs: Temp: 99.8 F (37.7 C) (12/11 0500) Temp Source: Oral (12/11 0500) BP: 123/70 mmHg (12/11 0600) Pulse Rate: 94 (12/11 0600)  Recent Labs  10/28/15 1050 10/30/15 0500  WBC 8.2 8.1  HGB 7.9* 7.7*  HCT 25.1* 24.6*  PLT 248 249  CREATININE 1.25*  --   PHOS 4.7*  --   ALBUMIN 2.1*  --    Estimated Creatinine Clearance: 69.1 mL/min (by C-G formula based on Cr of 1.25).   Assessment: 49 yo patient on HD. Pharmacy consulted for renal dosing of medications.   Plan:  No medications require adjustment at present. Pharmacy will continue to monitor patients medications and dose adjust as needed.    Demetrius Charityeldrin D. Trey Bebee, PharmD   10/30/2015

## 2015-10-30 NOTE — Progress Notes (Signed)
Subjective:   No acute events Patient laying in bed watching TV Remains on Vent  Objective:  Vital signs in last 24 hours:  Temp:  [99 F (37.2 C)-99.8 F (37.7 C)] 99.4 F (37.4 C) (12/11 0800) Pulse Rate:  [91-108] 92 (12/11 0800) Resp:  [6-32] 15 (12/11 0800) BP: (101-130)/(54-89) 108/72 mmHg (12/11 0800) SpO2:  [94 %-99 %] 97 % (12/11 0800) FiO2 (%):  [30 %] 30 % (12/11 0809)  Weight change:  Filed Weights   10/28/15 0445 10/29/15 0500  Weight: 102 kg (224 lb 13.9 oz) 104 kg (229 lb 4.5 oz)    Intake/Output: I/O last 3 completed shifts: In: 2340 [NG/GT:2290; IV Piggyback:50] Out: -    Intake/Output this shift:     Physical Exam: General: critically ill appearing  Head/ENT:  OM moist, NG tube  Eyes: anicteric  Neck: Tracheostomy in place  Lungs:  Scattered rhonchi, vent assisted    Heart: S1S2 no rubs  Abdomen:  Soft, nontender  Extremities: 3+ dependent edema, anasarca  Neurologic: Awake, following simple commands today  Access:  R IJ permcath.       Basic Metabolic Panel:  Recent Labs Lab 10/24/15 2119 10/25/15 0500 10/26/15 1116 10/28/15 1050  NA 140 136 139 140  K 4.8 4.8 5.6* 5.8*  CL 99* 96* 97* 97*  CO2 35* 33* 33* 33*  GLUCOSE 132* 126* 104* 152*  BUN 71* 60* 83* 72*  CREATININE 1.10* 1.04* 1.29* 1.25*  CALCIUM 8.3* 8.2* 8.8* 8.7*  PHOS 3.9  --  4.5 4.7*    Liver Function Tests:  Recent Labs Lab 10/24/15 2119 10/26/15 1116 10/28/15 1050  ALBUMIN 1.9* 2.2* 2.1*   No results for input(s): LIPASE, AMYLASE in the last 168 hours. No results for input(s): AMMONIA in the last 168 hours.  CBC:  Recent Labs Lab 10/24/15 2026 10/25/15 0500 10/26/15 1116 10/28/15 1050 10/30/15 0500  WBC 7.4 8.0 9.0 8.2 8.1  HGB 7.6* 7.6* 8.0* 7.9* 7.7*  HCT 24.9* 24.2* 25.8* 25.1* 24.6*  MCV 97.3 95.8 96.1 96.0 97.4  PLT 183 195 215 248 249    Cardiac Enzymes: No results for input(s): CKTOTAL, CKMB, CKMBINDEX, TROPONINI in the last 168  hours.  BNP: Invalid input(s): POCBNP  CBG:  Recent Labs Lab 10/29/15 1634 10/29/15 2000 10/30/15 0026 10/30/15 0452 10/30/15 0724  GLUCAP 146* 134* 114* 117* 114*    Microbiology: Results for orders placed or performed during the hospital encounter of August 21, 2015  Blood Culture (routine x 2)     Status: None   Collection Time: 21-Aug-2015  8:51 AM  Result Value Ref Range Status   Specimen Description BLOOD [name]  Final   Special Requests BOTTLES DRAWN AEROBIC AND ANAEROBIC  3CC  Final   Culture NO GROWTH 5 DAYS  Final   Report Status 08/17/2015 FINAL  Final  Blood Culture (routine x 2)     Status: None   Collection Time: 2015/08/21  9:20 AM  Result Value Ref Range Status   Specimen Description BLOOD LEFT ARM  Final   Special Requests BOTTLES DRAWN AEROBIC AND ANAEROBIC  1CC  Final   Culture NO GROWTH 5 DAYS  Final   Report Status 08/17/2015 FINAL  Final  Wound culture     Status: None   Collection Time: 08/21/2015  9:20 AM  Result Value Ref Range Status   Specimen Description DECUBITIS  Final   Special Requests Normal  Final   Gram Stain   Final    FEW  WBC SEEN MANY GRAM NEGATIVE RODS RARE GRAM POSITIVE COCCI    Culture   Final    HEAVY GROWTH ESCHERICHIA COLI MODERATE GROWTH ENTEROBACTER AEROGENES PROTEUS MIRABILIS HEAVY GROWTH ENTEROCOCCUS SPECIES VRE HAVE INTRINSIC RESISTANCE TO MOST COMMONLY USED ANTIBIOTICS AND THE ABILITY TO ACQUIRE RESISTANCE TO MOST AVAILABLE ANTIBIOTICS.    Report Status 08/16/2015 FINAL  Final   Organism ID, Bacteria ESCHERICHIA COLI  Final   Organism ID, Bacteria ENTEROBACTER AEROGENES  Final   Organism ID, Bacteria PROTEUS MIRABILIS  Final   Organism ID, Bacteria ENTEROCOCCUS SPECIES  Final      Susceptibility   Enterobacter aerogenes - MIC*    CEFTAZIDIME <=1 SENSITIVE Sensitive     CEFAZOLIN >=64 RESISTANT Resistant     CEFTRIAXONE <=1 SENSITIVE Sensitive     CIPROFLOXACIN <=0.25 SENSITIVE Sensitive     GENTAMICIN <=1 SENSITIVE  Sensitive     IMIPENEM 1 SENSITIVE Sensitive     TRIMETH/SULFA <=20 SENSITIVE Sensitive     * MODERATE GROWTH ENTEROBACTER AEROGENES   Escherichia coli - MIC*    AMPICILLIN <=2 SENSITIVE Sensitive     CEFTAZIDIME <=1 SENSITIVE Sensitive     CEFAZOLIN <=4 SENSITIVE Sensitive     CEFTRIAXONE <=1 SENSITIVE Sensitive     CIPROFLOXACIN <=0.25 SENSITIVE Sensitive     GENTAMICIN <=1 SENSITIVE Sensitive     IMIPENEM <=0.25 SENSITIVE Sensitive     TRIMETH/SULFA <=20 SENSITIVE Sensitive     * HEAVY GROWTH ESCHERICHIA COLI   Proteus mirabilis - MIC*    AMPICILLIN >=32 RESISTANT Resistant     CEFTAZIDIME <=1 SENSITIVE Sensitive     CEFAZOLIN 8 SENSITIVE Sensitive     CEFTRIAXONE <=1 SENSITIVE Sensitive     CIPROFLOXACIN <=0.25 SENSITIVE Sensitive     GENTAMICIN <=1 SENSITIVE Sensitive     IMIPENEM 1 SENSITIVE Sensitive     TRIMETH/SULFA <=20 SENSITIVE Sensitive     * PROTEUS MIRABILIS   Enterococcus species - MIC*    AMPICILLIN >=32 RESISTANT Resistant     VANCOMYCIN >=32 RESISTANT Resistant     GENTAMICIN SYNERGY SENSITIVE Sensitive     TETRACYCLINE Value in next row Resistant      RESISTANT>=16    * HEAVY GROWTH ENTEROCOCCUS SPECIES  MRSA PCR Screening     Status: None   Collection Time: 07/24/2015  2:38 PM  Result Value Ref Range Status   MRSA by PCR NEGATIVE NEGATIVE Final    Comment:        The GeneXpert MRSA Assay (FDA approved for NASAL specimens only), is one component of a comprehensive MRSA colonization surveillance program. It is not intended to diagnose MRSA infection nor to guide or monitor treatment for MRSA infections.   Blood culture (single)     Status: None   Collection Time: 07/22/2015  3:36 PM  Result Value Ref Range Status   Specimen Description BLOOD RIGHT ASSIST CONTROL  Final   Special Requests BOTTLES DRAWN AEROBIC AND ANAEROBIC  Final   Culture NO GROWTH 5 DAYS  Final   Report Status 08/17/2015 FINAL  Final  Wound culture     Status: None    Collection Time: 08/13/15 12:37 PM  Result Value Ref Range Status   Specimen Description WOUND  Final   Special Requests Normal  Final   Gram Stain   Final    FEW WBC SEEN TOO NUMEROUS TO COUNT GRAM NEGATIVE RODS FEW GRAM POSITIVE COCCI    Culture   Final    HEAVY GROWTH ESCHERICHIA  COLI MODERATE GROWTH PROTEUS MIRABILIS LIGHT GROWTH KLEBSIELLA PNEUMONIAE MODERATE GROWTH ENTEROCOCCUS GALLINARUM CRITICAL RESULT CALLED TO, READ BACK BY AND VERIFIED WITH: [facility] BOR[facility] AT 1042 08/16/15 DV    Report Status 08/17/2015 FINAL  Final   Organism ID, Bacteria ESCHERICHIA COLI  Final   Organism ID, Bacteria PROTEUS MIRABILIS  Final   Organism ID, Bacteria KLEBSIELLA PNEUMONIAE  Final   Organism ID, Bacteria ENTEROCOCCUS GALLINARUM  Final      Susceptibility   Escherichia coli - MIC*    AMPICILLIN >=32 RESISTANT Resistant     CEFTAZIDIME 4 RESISTANT Resistant     CEFAZOLIN >=64 RESISTANT Resistant     CEFTRIAXONE 16 RESISTANT Resistant     GENTAMICIN 2 SENSITIVE Sensitive     IMIPENEM >=16 RESISTANT Resistant     TRIMETH/SULFA <=20 SENSITIVE Sensitive     Extended ESBL POSITIVE Resistant     PIP/TAZO Value in next row Resistant      RESISTANT>=128    CIPROFLOXACIN Value in next row Sensitive      SENSITIVE<=0.25    * HEAVY GROWTH ESCHERICHIA COLI   Klebsiella pneumoniae - MIC*    AMPICILLIN Value in next row Resistant      SENSITIVE<=0.25    CEFTAZIDIME Value in next row Resistant      SENSITIVE<=0.25    CEFAZOLIN Value in next row Resistant      SENSITIVE<=0.25    CEFTRIAXONE Value in next row Resistant      SENSITIVE<=0.25    CIPROFLOXACIN Value in next row Resistant      SENSITIVE<=0.25    GENTAMICIN Value in next row Sensitive      SENSITIVE<=0.25    IMIPENEM Value in next row Resistant      SENSITIVE<=0.25    TRIMETH/SULFA Value in next row Resistant      SENSITIVE<=0.25    PIP/TAZO Value in next row Resistant      RESISTANT>=128    * LIGHT GROWTH KLEBSIELLA PNEUMONIAE    Proteus mirabilis - MIC*    AMPICILLIN Value in next row Resistant      RESISTANT>=128    CEFTAZIDIME Value in next row Sensitive      RESISTANT>=128    CEFAZOLIN Value in next row Sensitive      RESISTANT>=128    CEFTRIAXONE Value in next row Sensitive      RESISTANT>=128    CIPROFLOXACIN Value in next row Sensitive      RESISTANT>=128    GENTAMICIN Value in next row Sensitive      RESISTANT>=128    IMIPENEM Value in next row Sensitive      RESISTANT>=128    TRIMETH/SULFA Value in next row Sensitive      RESISTANT>=128    PIP/TAZO Value in next row Sensitive      SENSITIVE<=4    * MODERATE GROWTH PROTEUS MIRABILIS   Enterococcus gallinarum - MIC*    AMPICILLIN Value in next row Resistant      SENSITIVE<=4    GENTAMICIN SYNERGY Value in next row Sensitive      SENSITIVE<=4    CIPROFLOXACIN Value in next row Resistant      RESISTANT>=8    TETRACYCLINE Value in next row Resistant      RESISTANT>=16    * MODERATE GROWTH ENTEROCOCCUS GALLINARUM  Wound culture     Status: None   Collection Time: 08/15/15  2:53 PM  Result Value Ref Range Status   Specimen Description WOUND  Final   Special Requests NONE  Final  Gram Stain FEW WBC SEEN NO ORGANISMS SEEN   Final   Culture NO GROWTH 3 DAYS  Final   Report Status 08/18/2015 FINAL  Final  C difficile quick scan w PCR reflex     Status: None   Collection Time: 08/17/15 11:34 AM  Result Value Ref Range Status   C Diff antigen NEGATIVE NEGATIVE Final   C Diff toxin NEGATIVE NEGATIVE Final   C Diff interpretation Negative for C. difficile  Final  Stool culture     Status: None   Collection Time: 09/03/15  3:51 PM  Result Value Ref Range Status   Specimen Description STOOL  Final   Special Requests Immunocompromised  Final   Culture   Final    NO SALMONELLA OR SHIGELLA ISOLATED No Pathogenic E. coli detected NO CAMPYLOBACTER DETECTED    Report Status 09/07/2015 FINAL  Final  C difficile quick scan w PCR reflex     Status:  None   Collection Time: 09/13/15 12:51 AM  Result Value Ref Range Status   C Diff antigen NEGATIVE NEGATIVE Final   C Diff toxin NEGATIVE NEGATIVE Final   C Diff interpretation Negative for C. difficile  Final  Culture, blood (routine x 2)     Status: None   Collection Time: 09/16/15 11:24 AM  Result Value Ref Range Status   Specimen Description BLOOD LEFT HAND  Final   Special Requests BOTTLES DRAWN AEROBIC AND ANAEROBIC  1CC  Final   Culture NO GROWTH 5 DAYS  Final   Report Status 09/21/2015 FINAL  Final  Culture, blood (routine x 2)     Status: None   Collection Time: 09/16/15 12:23 PM  Result Value Ref Range Status   Specimen Description BLOOD RIGHT HAND  Final   Special Requests BOTTLES DRAWN AEROBIC AND ANAEROBIC  1CC  Final   Culture  Setup Time   Final    GRAM POSITIVE COCCI AEROBIC BOTTLE ONLY CRITICAL RESULT CALLED TO, READ BACK BY AND VERIFIED WITH: TESS THOMAS,RN 09/17/2015 0631 BY JRS.    Culture   Final    ENTEROCOCCUS FAECALIS AEROBIC BOTTLE ONLY VRE HAVE INTRINSIC RESISTANCE TO MOST COMMONLY USED ANTIBIOTICS AND THE ABILITY TO ACQUIRE RESISTANCE TO MOST AVAILABLE ANTIBIOTICS. CRITICAL RESULT CALLED TO, READ BACK BY AND VERIFIED WITH: CHERYL SMITH AT 1610 09/19/15 DV    Report Status 09/21/2015 FINAL  Final   Organism ID, Bacteria ENTEROCOCCUS FAECALIS  Final      Susceptibility   Enterococcus faecalis - MIC*    AMPICILLIN <=2 SENSITIVE Sensitive     LINEZOLID 2 SENSITIVE Sensitive     CIPROFLOXACIN Value in next row Resistant      RESISTANT>=8    TETRACYCLINE Value in next row Resistant      RESISTANT>=16    VANCOMYCIN Value in next row Resistant      RESISTANT>=32    GENTAMICIN SYNERGY Value in next row Resistant      RESISTANT>=32    * ENTEROCOCCUS FAECALIS  Culture, respiratory (NON-Expectorated)     Status: None   Collection Time: 09/16/15  3:50 PM  Result Value Ref Range Status   Specimen Description TRACHEAL ASPIRATE  Final   Special Requests  Immunocompromised  Final   Gram Stain   Final    FEW WBC SEEN GOOD SPECIMEN - 80-90% WBCS RARE GRAM NEGATIVE RODS    Culture LIGHT GROWTH PSEUDOMONAS AERUGINOSA  Final   Report Status 09/19/2015 FINAL  Final   Organism ID, Bacteria PSEUDOMONAS AERUGINOSA  Final      Susceptibility   Pseudomonas aeruginosa - MIC*    CEFTAZIDIME 8 SENSITIVE Sensitive     CIPROFLOXACIN 2 INTERMEDIATE Intermediate     GENTAMICIN >=16 RESISTANT Resistant     IMIPENEM >=16 RESISTANT Resistant     PIP/TAZO Value in next row Sensitive      SENSITIVE32    CEFEPIME Value in next row Sensitive      SENSITIVE8    LEVOFLOXACIN Value in next row Resistant      RESISTANT>=8    * LIGHT GROWTH PSEUDOMONAS AERUGINOSA  Culture, expectorated sputum-assessment     Status: None   Collection Time: 09/22/15  2:09 PM  Result Value Ref Range Status   Specimen Description ENDOTRACHEAL  Final   Special Requests Normal  Final   Sputum evaluation THIS SPECIMEN IS ACCEPTABLE FOR SPUTUM CULTURE  Final   Report Status 09/24/2015 FINAL  Final  Culture, respiratory (NON-Expectorated)     Status: None   Collection Time: 09/22/15  2:09 PM  Result Value Ref Range Status   Specimen Description ENDOTRACHEAL  Final   Special Requests Normal Reflexed from [id number]  Final   Gram Stain   Final    FEW WBC SEEN FEW GRAM NEGATIVE RODS POOR SPECIMEN - LESS THAN 70% WBCS    Culture   Final    MODERATE GROWTH PSEUDOMONAS AERUGINOSA LIGHT GROWTH KLEBSIELLA PNEUMONIAE REFER TO SENSITIVITIES FROM PREVIOUS CULTURE FOR ORG 2    Report Status 10/01/2015 FINAL  Final   Organism ID, Bacteria PSEUDOMONAS AERUGINOSA  Final   Organism ID, Bacteria KLEBSIELLA PNEUMONIAE  Final      Susceptibility   Pseudomonas aeruginosa - MIC*    CEFTAZIDIME 4 SENSITIVE Sensitive     CIPROFLOXACIN 2 INTERMEDIATE Intermediate     GENTAMICIN >=16 RESISTANT Resistant     IMIPENEM >=16 RESISTANT Resistant     PIP/TAZO Value in next row Sensitive      SENSITIVE16     * MODERATE GROWTH PSEUDOMONAS AERUGINOSA  Tissue culture     Status: None   Collection Time: 09/24/15  6:44 AM  Result Value Ref Range Status   Specimen Description BONE  Final   Special Requests Normal  Final   Gram Stain MODERATE WBC SEEN FEW GRAM NEGATIVE RODS   Final   Culture   Final    MODERATE GROWTH ESCHERICHIA COLI MODERATE GROWTH KLEBSIELLA PNEUMONIAE ESBL-EXTENDED SPECTRUM BETA LACTAMASE-THE ORGANISM IS RESISTANT TO PENICILLINS, CEPHALOSPORINS AND AZTREONAM ACCORDING TO CLSI M100-S15 VOL.25 N01 JAN 2005. ORGANISM 1 This organism isolate is resistant to one or more antiotic agents in three or more antimicrobial categories.  Suggest Infectious Disease consult.   ORGANISM 2 CRITICAL RESULT CALLED TO, READ BACK BY AND VERIFIED WITH: RN Mardene Celeste LINDSAY 09/27/15 1005AM    Report Status 09/28/2015 FINAL  Final   Organism ID, Bacteria ESCHERICHIA COLI  Final   Organism ID, Bacteria KLEBSIELLA PNEUMONIAE  Final      Susceptibility   Escherichia coli - MIC*    AMPICILLIN >=32 RESISTANT Resistant     CEFTAZIDIME 4 RESISTANT Resistant     CEFAZOLIN >=64 RESISTANT Resistant     CEFTRIAXONE 8 RESISTANT Resistant     GENTAMICIN <=1 SENSITIVE Sensitive     IMIPENEM 8 RESISTANT Resistant     TRIMETH/SULFA <=20 SENSITIVE Sensitive     Extended ESBL POSITIVE Resistant     PIP/TAZO Value in next row Resistant      RESISTANT>=128    * MODERATE GROWTH ESCHERICHIA  COLI   Klebsiella pneumoniae - MIC*    AMPICILLIN Value in next row Resistant      RESISTANT>=128    CEFTAZIDIME Value in next row Resistant      RESISTANT>=128    CEFAZOLIN Value in next row Resistant      RESISTANT>=128    CEFTRIAXONE Value in next row Resistant      RESISTANT>=128    CIPROFLOXACIN Value in next row Resistant      RESISTANT>=128    GENTAMICIN Value in next row Resistant      RESISTANT>=128    IMIPENEM Value in next row Resistant      RESISTANT>=128    TRIMETH/SULFA Value in next row Sensitive       RESISTANT>=128    PIP/TAZO Value in next row Resistant      RESISTANT>=128    * MODERATE GROWTH KLEBSIELLA PNEUMONIAE  Anaerobic culture     Status: None   Collection Time: 09/24/15  3:55 PM  Result Value Ref Range Status   Specimen Description BONE  Final   Special Requests Normal  Final   Culture NO ANAEROBES ISOLATED  Final   Report Status 09/29/2015 FINAL  Final  Culture, fungus without smear     Status: None   Collection Time: 09/24/15  3:55 PM  Result Value Ref Range Status   Specimen Description BONE  Final   Special Requests Normal  Final   Culture NO FUNGUS ISOLATED AFTER 24 DAYS  Final   Report Status 10/18/2015 FINAL  Final  C difficile quick scan w PCR reflex     Status: None   Collection Time: 10/07/15  2:02 PM  Result Value Ref Range Status   C Diff antigen NEGATIVE NEGATIVE Final   C Diff toxin NEGATIVE NEGATIVE Final   C Diff interpretation Negative for C. difficile  Final  Culture, blood (routine x 2)     Status: None   Collection Time: 10/16/15  9:52 AM  Result Value Ref Range Status   Specimen Description BLOOD LEFT HAND  Final   Special Requests   Final    BOTTLES DRAWN AEROBIC AND ANAEROBIC  AER 6CC ANA 4CC   Culture NO GROWTH 6 DAYS  Final   Report Status 10/22/2015 FINAL  Final  Culture, blood (routine x 2)     Status: None   Collection Time: 10/16/15 12:25 PM  Result Value Ref Range Status   Specimen Description BLOOD LEFT HAND  Final   Special Requests BOTTLES DRAWN AEROBIC AND ANAEROBIC  5CC  Final   Culture NO GROWTH 6 DAYS  Final   Report Status 10/22/2015 FINAL  Final  Culture, expectorated sputum-assessment     Status: None   Collection Time: 10/18/15  1:15 PM  Result Value Ref Range Status   Specimen Description SPUTUM  Final   Special Requests NONE  Final   Sputum evaluation THIS SPECIMEN IS ACCEPTABLE FOR SPUTUM CULTURE  Final   Report Status 10/18/2015 FINAL  Final  Culture, respiratory (NON-Expectorated)     Status: None   Collection  Time: 10/18/15  1:15 PM  Result Value Ref Range Status   Specimen Description SPUTUM  Final   Special Requests NONE Reflexed from [id number]  Final   Gram Stain   Final    GOOD SPECIMEN - 80-90% WBCS MODERATE WBC SEEN MANY GRAM NEGATIVE RODS    Culture   Final    HEAVY GROWTH PSEUDOMONAS AERUGINOSA MODERATE GROWTH KLEBSIELLA PNEUMONIAE CRITICAL RESULT CALLED TO, READ BACK  BY AND VERIFIED WITHRod Mae AT 4098 10/21/15 DV KLEBSIELLA PNEUMONIAE This organism isolate is resistant to one or more antiotic agents in three or more antimicrobial categories.  Suggest Infectious Disease  consult.      Report Status 10/22/2015 FINAL  Final   Organism ID, Bacteria PSEUDOMONAS AERUGINOSA  Final   Organism ID, Bacteria KLEBSIELLA PNEUMONIAE  Final      Susceptibility   Klebsiella pneumoniae - MIC*    AMPICILLIN >=32 RESISTANT Resistant     CEFAZOLIN >=64 RESISTANT Resistant     CEFTRIAXONE >=64 RESISTANT Resistant     CIPROFLOXACIN >=4 RESISTANT Resistant     GENTAMICIN >=16 RESISTANT Resistant     IMIPENEM >=16 RESISTANT Resistant     NITROFURANTOIN >=512 RESISTANT Resistant     TRIMETH/SULFA <=20 SENSITIVE Sensitive     * MODERATE GROWTH KLEBSIELLA PNEUMONIAE   Pseudomonas aeruginosa - MIC*    CEFTAZIDIME 4 SENSITIVE Sensitive     CIPROFLOXACIN >=4 RESISTANT Resistant     GENTAMICIN 8 INTERMEDIATE Intermediate     IMIPENEM >=16 RESISTANT Resistant     PIP/TAZO Value in next row Sensitive      SENSITIVE32    * HEAVY GROWTH PSEUDOMONAS AERUGINOSA  C difficile quick scan w PCR reflex     Status: None   Collection Time: 10/18/15  4:39 PM  Result Value Ref Range Status   C Diff antigen NEGATIVE NEGATIVE Final   C Diff toxin NEGATIVE NEGATIVE Final   C Diff interpretation Negative for C. difficile  Final    Coagulation Studies: No results for input(s): LABPROT, INR in the last 72 hours.  Urinalysis: No results for input(s): COLORURINE, LABSPEC, PHURINE, GLUCOSEU, HGBUR, BILIRUBINUR,  KETONESUR, PROTEINUR, UROBILINOGEN, NITRITE, LEUKOCYTESUR in the last 72 hours.  Invalid input(s): APPERANCEUR    Imaging: No results found.   Medications:   . feeding supplement (JEVITY 1.5 CAL/FIBER) 1,000 mL (10/29/15 0813)  . norepinephrine Stopped (10/08/15 0530)   . amantadine  200 mg Per Tube Q7 days  . antiseptic oral rinse  7 mL Mouth Rinse QID  . aspirin  81 mg Oral Daily  . cefTAZidime (FORTAZ)  IV  1 g Intravenous Q24H  . chlorhexidine gluconate  15 mL Mouth Rinse BID  . epoetin (EPOGEN/PROCRIT) injection  10,000 Units Intravenous Q M,W,F-HD  . escitalopram  10 mg Oral Daily  . feeding supplement (PRO-STAT SUGAR FREE 64)  30 mL Oral 6 X Daily  . folic acid  1 mg Oral Daily  . free water  20 mL Per Tube 6 times per day  . heparin subcutaneous  5,000 Units Subcutaneous Q12H  . hydrocortisone  10 mg Per Tube BID  . insulin aspart  0-9 Units Subcutaneous 6 times per day  . lidocaine  1 patch Transdermal Q24H  . midodrine  5 mg Per Tube TID WC  . multivitamin  5 mL Oral Daily  . pantoprazole sodium  40 mg Per Tube Daily  . saccharomyces boulardii  250 mg Oral BID  . sodium chloride  10-40 mL Intracatheter Q12H  . sulfamethoxazole-trimethoprim  2.5 tablet Per Tube q1800  . thiamine  100 mg Per Tube Daily   sodium chloride, sodium chloride, acetaminophen (TYLENOL) oral liquid 160 mg/5 mL, HYDROmorphone (DILAUDID) injection, ipratropium-albuterol, loperamide, morphine injection, ondansetron (ZOFRAN) IV, oxyCODONE, sodium chloride, zinc oxide  Assessment/ Plan:  49 y.o. 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 Admitted on 08/02/2015  1. End-stage renal disease on hemodialysis on HD MWF. The patient has been on dialysis since October of 2014. R IJ permcath. Dialysis treatment with IV albumin for oncotic support. Midodrine and hydrocortisone ordered for blood pressure support.  -  Monitor daily for dialysis need. Dialysis for later today: albumin, midodrine and hydrocortisone for hemodynamic support.    - Continue MWF schedule for now.  UF goal of 0.5 to 1 kg -  As tolerated - next HD on Monday.   2. Anemia of CKD. Hemoglobin 7.7 - Continue to monitor CBC  - Continue scheduled Epogen for anemia of chronic kidney disease.  3. Secondary hyperparathyroidism: PTH low at 55 (9/25). New PTH sent - Phosphorous 4.7 and acceptable. She remains off Binder therapy and activated vitamin D.    4. Sepsis: VRE in blood. Bone cultures E. Coli and klebsiella - Treatment as per ID and ICU team: daptomycin completed on 11/10 and now on high dose trimethoprim/sulfamethoxazole.   5. Acute resp failure:  Remains vent dependent   6. Generalized edema - Fluid removal with dialysis is limited due to hypotension   LOS: 79 Courtney [name] 12/11/201610:03 AM

## 2015-10-30 NOTE — Progress Notes (Addendum)
Patient's husband and son in room to visit patient.   Upon entering room, RN observed that patient's husband and son are in room to visit patient.  Both husband and son not using contact precaution gown.  RN requested for family to wear gown while visiting but family refused.  Son is also eating in room which this RN reeducated family about the risk of eating inside a contact precaution room.  Son states "I am about done"  Husband states "If I were to get sick I would have gotten sick a long time ago."  Press photographerCharge nurse notified of situation.   Husband requested ice cream, RN once again reminded patient's husband of the reasons to restrict visitors eating food in patient's room while contact precaution is in effect.  Husband states that he is aware of the risks and is willing to take the risk of eating inside patient's room.  Charge Nurse, Florentina AddisonKatie and Nursing Supervisor, Oakwoodheryl consulted and aware.

## 2015-10-30 NOTE — Progress Notes (Signed)
[facility] [place] [facility] Medicine Progess Note  Name: Courtney GrinderHedda McMillian [name] MRN: [id number] DOB: 01-06-1966    ADMISSION DATE:  11-18-15   INITIAL PRESENTATION:  49 AAF who has been in medical facilities (hosp, LTAC, rehab) for 2 yrs following gastric bypass surgery with multiple complications. Now with chronic trach, ESRD, profound debilitation, severe sacral pressure ulcer. Was seemingly making progress and transferred to rehab facility approx one week prior to this admission. [facility] and hypotension. Working dx of severe sepsis/septic shock due to infected sacral pressure ulcer. Since admission. Her course is been very complicated with numerous complications including septic shock and GI bleeding.    INTERVAL HISTORY: Patient doing much better this morning, more alert, noted to have some mild tachypnea when placed on lower settings of pressure support yesterday. Placed back on the vent for the majority of the day and night. We will attempt pressure support again today, as planned on a daily basis. I have spoken to the husband reintroduced ID of a possible PEG tube placement in the near future, he states that she does have a complex GI anatomy and the obstacle in the past was finding a surgeon suitable to place an NG tube, at this time he again would like patient to be liberated from the vent so that she may move forward to taking oral intake.  Summary of MAJOR EVENTS/TEST RESULTS: Admission 02/07/14-05/07/14 Admission 07/21/14-09/06/14 Discharged to [facility]. Pt had palliative consult at that time, were asked to sign off by husband.  09/23 CT head: NAD 09/23 EEG: no epileptiform activity 09/23 PRBCs for Hgb 6.4 09/24 bedside debridement of sacral wound. Abscess drained 09/25 Off vasopressors. More alert. No distress. Worsening thrombocytopenia. Vanc DC'd 09/29  Dr. [name] (I.D) excused from the case by patient's husband. 10/02 MRI -multiple infarcts 10/03  tracheal bleeding- transfused platelets 10/03 hospitalist service excused from the case by patient's husband 10/03 Echocardiogram ejection fraction was 55-60%, pulmonary systolic pressure was 39 mmHg 52/8410/08 restart TF's at lower rate, attempt reg HD  10/12 Transferred to med-surg floor. Remains on PCCM service 10/14 SLP eval: pt unable to tolerate PMV adequately 10/17-will re-attempt PM valve-discussed with Speech therapist 10/18 passed swallow eval-start pureed thick foods no thin liquids-continue NG feeds 10/19 transferred to step down for sepsis/aspiration pneumonia 10/19 cxr shows RLL opacity 10/21 sacral decub debride at bedside by surgery 10/22 started back on vasopressors while on HD 10/27 CT with osteo, R hip fx, unable to identify tip of dubhoff tube - sent for fluoro study 10/28 Ortho consultation: I do not feel that she is a surgical candidate. Therefore, I feel that it would best to manage this fracture nonsurgically and allow it to heal by itself over time, which it should.  10/28 Gen Surg consultation: Due to the lack of free air or free flow of contrast and the peritoneum there is no indication for any surgical intervention on this. Would recommend pulling back the feeding tube 1-2 cm. Would recheck an abdominal film to confirm no pneumoperitoneum in the morning. Absence of any changes okay to continue using Dobbhoff for feeding and medications. 10/28 gastrograffin study: The study confirms that the feeding tube pes perforated through the duodenum and the tip is within a cavity that fills with injected contrast. The cavity does appear walled off 11/4 refuses oral feeds, continue TF's CT reviewed: T8-T9 discitis/osteomyelitis, RT HIP FRACTURE 11/5 placed back on vasopressors, placed back on Vent due to resp acidosis, levophed turned off  11/6 afternoon 11/5 PM Trach changed out due to cuff leak, #6 Shiley cuffed, 11/6 dubhoff occluded, removed and replaced, new dubhoff shows tube in  the antral stomach 11/15 refusing to take oral feeds 11/18 placed back on Vent for increased WOB,SOB. 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 10/18/2015; the patient developed a new left basal pneumonia; with recurrent sepsis.  10/19/2015; fevers resolved with IV acetaminophen. Tube feeds changed from vital to Jevity.  12/7 CXR with stable b/l opacities.    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 Pneumoniae, P. Mirabilis, VRE Wound 9/26 >> No growth Blood 9/23 >> NEG CDiff 9/27>>neg Stool Cx 10/15>> negative Cdiff 10/25>>neg Trach Aspirate 10/28>> light growth pseudomonas Blood 10/28 >> 1/2 GPC >>  Sputum cultures obtained 09/22/15 due to mucus plugging>>Pseudomonas BONE TISSUE Cx 11/3>>E.coli (ESBL), k. Pneumonia (daptomycin and septra) Bld Cx 11/27>>negative thus far.  Trach Asp 11/27>> grew out Pseudomonas, and Klebsiella pneumonia.  ANTIMICROBIALS:  Aztreonam 9/23 >> 9/24 Vanc 9/23 >> 9/25 Vanc 9/26>>9/27 Daptomycin 9/27>> 10/12, 10/28>> off Meropenem 9/23 >> 10/14, 10/28 >> 10/31 Septra 11/8>> Zosyn 11/27,>> 11/30. Levaquin 11/29>> 11/29. Ceftaz 12/1>>  VITAL SIGNS: Temp:  [99 F (37.2 C)-99.8 F (37.7 C)] 99.4 F (37.4 C) (12/11 0800) Pulse Rate:  [91-108] 94 (12/11 1000) Resp:  [6-32] 21 (12/11 1000) BP: (101-130)/(54-89) 120/63 mmHg (12/11 1000) SpO2:  [94 %-99 %] 97 % (12/11 1000) FiO2 (%):  [30 %] 30 % (12/11 0809) HEMODYNAMICS:   VENTILATOR SETTINGS: Vent Mode:  [-] PRVC FiO2 (%):  [30 %] 30 % Set Rate:  [14 bmp] 14  bmp Vt Set:  [530 mL] 530 mL PEEP:  [5 cmH20] 5 cmH20 Plateau Pressure:  [18 cmH20] 18 cmH20 INTAKE / OUTPUT:  Intake/Output Summary (Last 24 hours) at 10/30/15 1029 Last data filed at 10/30/15 1000  Gross per 24 hour  Intake   1640 ml  Output      0 ml  Net   1640 ml   PHYSICAL EXAMINATION: Physical Examination:   VS: BP 120/63 mmHg  Pulse 94  Temp(Src) 99.4 F (37.4 C) (Oral)  Resp 21  Ht  (1.753 m)  Wt 104 kg (229 lb 4.5 oz)  BMI 33.84 kg/m2  SpO2 97%  LMP  (LMP Unknown)  General Appearance: No resp distress, coarse upper airway sounds, on vent Neuro: profoundly diffusely weak, alert today, sleeping but easily arousable HEENT: cushingoid facies, PERRLA, EOM intact Neck: trach site clean Pulmonary: clear anteriorly Cardiovascular: reg, + syst murmur Abdomen: soft, NT, +BS Extremities: warm, R>L UE edema    LABORATORY PANEL:   CBC  Recent Labs Lab 10/30/15 0500  WBC 8.1  HGB 7.7*  HCT 24.6*  PLT 249    Chemistries   Recent Labs Lab 10/28/15 1050  NA 140  K 5.8*  CL 97*  CO2 33*  GLUCOSE 152*  BUN 72*  CREATININE 1.25*  CALCIUM 8.7*  PHOS 4.7*       IMPRESSION/PLAN (current major issues):  Recurrent sepsis, due to pneumonia.   Anemia with macrocytosis -Uncertain if this is due to  chronic slow GI bleed or simple critical care associated anemia. She has already receiving folate, thiamine, and multivitamin. Her anemia has not responded to oral medications, this may be due to her GI malabsorption. Will also check Vit D levels (discussed with nephrology) - PTH, 25OH, 1,25OH Vit d  Pneumonia with sepsis-better. -Chest x-ray shows new left lower lobe infiltrate,which is now improving.  Sputum cultures show pseudomonas. The patient is once again, vent dependent. She has developed again recurrent sepsis.Marland Kitchen Sputum cultures showing Pseudomonas, and Klebsiella, currently covered with this. Ceftaz total 10 days, stop date 12/11 Weaning trial  daily off MV - currently on PSV trials with plan to transition back to TCT  Worsening sacral decubitus ulcer, stage IV. -The patient is already had a sacral bone scraping, and has been placed on Septra for antibiotic, per ID recommendations. Her sacral decubitus does not appear to be improving and in fact appears to be worsening. In addition, she has several new smaller areas of tissue breakdown.  End-stage renal disease. -Continue on hemodialysis per nephrology.  Diarrhea. -This has improved since changing her loperamide 2 twice daily scheduled, and changing her tube feeds back to Jevity. Continue loperamide to when necessary.   Trach Care -changed on 11/5 to #6 Shiley due to cuff leak   Patient appears to be stable for transfer to an LTAC facility at this point in time.   Additional Chronic issues and complicating factors during this admission.  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  Thrombocytopenia, resolved - HIT panel negative 9/25 Recurrent Severe sepsis, due to sacral pressure ulcer DM 2, controlled.  Chronic steroid therapy, for a history of of adrenal insufficiency Incidental finding of R hip fx - conservative mgmt. Waxing and waning encephalopathy - she was evaluated by both the psychiatry and neurology services, and it was deemed that the patient lacks decisional capacity at this time. 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 Husband declined TEE Profound deconditioning-criticall illness neuropathy Chronic pain   Updated husband via telephone, there are no significant changes in her status or care plan today.   Wells Guiles, MD [facility] On Call Pager - [phone] (please enter [phone])    Critical Care Attestation.  I have personally obtained a history, examined the patient, evaluated laboratory and imaging results, formulated the assessment and plan and placed  orders. The case was discussed with the critical care RN, nutrition, respiratory therapist, Associate Professor. The Patient requires high complexity decision making for assessment and support, frequent evaluation and titration of therapies, application of advanced monitoring technologies and extensive interpretation of multiple databases. The patient has critical illness that could lead imminently to failure of 1 or more organ systems and requires the highest level of physician preparedness to intervene.  Critical Care Time devoted to patient care services described in this note is 35 minutes and is exclusive of time spent in procedures.

## 2015-10-30 NOTE — Progress Notes (Deleted)
Dr Ardyth Manam to notify RN in the presence of charge nurse, Florentina AddisonKatie of patient code status now as DNR, MD will consult hospice.  Per MD, patient is able to have pet canine in to ICU 20 to visit patient provided pet's rabies vaccine is up to date.

## 2015-10-31 LAB — GLUCOSE, CAPILLARY
GLUCOSE-CAPILLARY: 110 mg/dL — AB (ref 65–99)
GLUCOSE-CAPILLARY: 106 mg/dL — AB (ref 65–99)
Glucose-Capillary: 101 mg/dL — ABNORMAL HIGH (ref 65–99)
Glucose-Capillary: 110 mg/dL — ABNORMAL HIGH (ref 65–99)
Glucose-Capillary: 118 mg/dL — ABNORMAL HIGH (ref 65–99)
Glucose-Capillary: 118 mg/dL — ABNORMAL HIGH (ref 65–99)
Glucose-Capillary: 122 mg/dL — ABNORMAL HIGH (ref 65–99)

## 2015-10-31 LAB — RENAL FUNCTION PANEL
ALBUMIN: 2 g/dL — AB (ref 3.5–5.0)
Anion gap: 9 (ref 5–15)
BUN: 92 mg/dL — AB (ref 6–20)
CHLORIDE: 101 mmol/L (ref 101–111)
CO2: 34 mmol/L — AB (ref 22–32)
CREATININE: 1.43 mg/dL — AB (ref 0.44–1.00)
Calcium: 8.7 mg/dL — ABNORMAL LOW (ref 8.9–10.3)
GFR calc non Af Amer: 42 mL/min — ABNORMAL LOW (ref >60)
GFR, EST AFRICAN AMERICAN: 49 mL/min — AB (ref >60)
GLUCOSE: 109 mg/dL — AB (ref 65–99)
POTASSIUM: 6 mmol/L — AB (ref 3.5–5.1)
Phosphorus: 5.2 mg/dL — ABNORMAL HIGH (ref 2.5–4.6)
SODIUM: 144 mmol/L (ref 135–145)

## 2015-10-31 LAB — CBC
HEMATOCRIT: 24.4 % — AB (ref 35.0–47.0)
Hemoglobin: 7.7 g/dL — ABNORMAL LOW (ref 12.0–16.0)
MCH: 30.3 pg (ref 26.0–34.0)
MCHC: 31.6 g/dL — AB (ref 32.0–36.0)
MCV: 95.9 fL (ref 80.0–100.0)
PLATELETS: 257 10*3/uL (ref 150–440)
RBC: 2.54 MIL/uL — ABNORMAL LOW (ref 3.80–5.20)
RDW: 17.6 % — AB (ref 11.5–14.5)
WBC: 9.4 10*3/uL (ref 3.6–11.0)

## 2015-10-31 MED ORDER — LOPERAMIDE HCL 2 MG PO CAPS
2.0000 mg | ORAL_CAPSULE | Freq: Two times a day (BID) | ORAL | Status: DC
  Administered 2015-10-31 – 2015-11-11 (×21): 2 mg via ORAL
  Filled 2015-10-31 (×21): qty 1

## 2015-10-31 NOTE — Progress Notes (Signed)
Subjective:  Pt seen at bedside. Clothing and bedding being changed this AM. Pt appears to be in visible pain. Remains on the ventilator at present.   Objective:  Vital signs in last 24 hours:  Temp:  [99.2 F (37.3 C)-99.8 F (37.7 C)] 99.4 F (37.4 C) (12/12 0400) Pulse Rate:  [89-99] 97 (12/12 0700) Resp:  [14-28] 23 (12/12 0700) BP: (104-150)/(50-96) 110/80 mmHg (12/12 0700) SpO2:  [96 %-100 %] 97 % (12/12 0700) FiO2 (%):  [30 %] 30 % (12/12 0801)  Weight change:  Filed Weights   10/28/15 0445 10/29/15 0500  Weight: 102 kg (224 lb 13.9 oz) 104 kg (229 lb 4.5 oz)    Intake/Output: I/O last 3 completed shifts: In: 2130 [NG/GT:2130] Out: 1 [Stool:1]   Intake/Output this shift:     Physical Exam: General: critically ill appearing  Head/ENT:  OM moist, NG tube  Eyes: anicteric  Neck: Tracheostomy in place  Lungs:  Scattered rhonchi, vent assisted  fio2 30%  Heart: S1S2 no rubs  Abdomen:  Soft, nontender  Extremities: 2+ dependent edema, anasarca  Neurologic: Awake, grimacing in pain  Access:  R IJ permcath.       Basic Metabolic Panel:  Recent Labs Lab 10/24/15 2119 10/25/15 0500 10/26/15 1116 10/28/15 1050 10/30/15 1213 10/31/15 0448  NA 140 136 139 140  --  144  K 4.8 4.8 5.6* 5.8* 5.6* 6.0*  CL 99* 96* 97* 97*  --  101  CO2 35* 33* 33* 33*  --  34*  GLUCOSE 132* 126* 104* 152*  --  109*  BUN 71* 60* 83* 72*  --  92*  CREATININE 1.10* 1.04* 1.29* 1.25*  --  1.43*  CALCIUM 8.3* 8.2* 8.8* 8.7*  --  8.7*  PHOS 3.9  --  4.5 4.7*  --  5.2*    Liver Function Tests:  Recent Labs Lab 10/24/15 2119 10/26/15 1116 10/28/15 1050 10/31/15 0448  ALBUMIN 1.9* 2.2* 2.1* 2.0*   No results for input(s): LIPASE, AMYLASE in the last 168 hours. No results for input(s): AMMONIA in the last 168 hours.  CBC:  Recent Labs Lab 10/25/15 0500 10/26/15 1116 10/28/15 1050 10/30/15 0500 10/31/15 0448  WBC 8.0 9.0 8.2 8.1 9.4  HGB 7.6* 8.0* 7.9* 7.7* 7.7*   HCT 24.2* 25.8* 25.1* 24.6* 24.4*  MCV 95.8 96.1 96.0 97.4 95.9  PLT 195 215 248 249 257    Cardiac Enzymes: No results for input(s): CKTOTAL, CKMB, CKMBINDEX, TROPONINI in the last 168 hours.  BNP: Invalid input(s): POCBNP  CBG:  Recent Labs Lab 10/30/15 1555 10/30/15 2037 10/30/15 2356 10/31/15 0442 10/31/15 0752  GLUCAP 145* 116* 122* 101* 118*    Microbiology: Results for orders placed or performed during the hospital encounter of 08-31-15  Blood Culture (routine x 2)     Status: None   Collection Time: 08/31/15  8:51 AM  Result Value Ref Range Status   Specimen Description BLOOD [name]  Final   Special Requests BOTTLES DRAWN AEROBIC AND ANAEROBIC  3CC  Final   Culture NO GROWTH 5 DAYS  Final   Report Status 08/17/2015 FINAL  Final  Blood Culture (routine x 2)     Status: None   Collection Time: 08/31/2015  9:20 AM  Result Value Ref Range Status   Specimen Description BLOOD LEFT ARM  Final   Special Requests BOTTLES DRAWN AEROBIC AND ANAEROBIC  1CC  Final   Culture NO GROWTH 5 DAYS  Final   Report Status 08/17/2015  FINAL  Final  Wound culture     Status: None   Collection Time: 08/11/2015  9:20 AM  Result Value Ref Range Status   Specimen Description DECUBITIS  Final   Special Requests Normal  Final   Gram Stain   Final    FEW WBC SEEN MANY GRAM NEGATIVE RODS RARE GRAM POSITIVE COCCI    Culture   Final    HEAVY GROWTH ESCHERICHIA COLI MODERATE GROWTH ENTEROBACTER AEROGENES PROTEUS MIRABILIS HEAVY GROWTH ENTEROCOCCUS SPECIES VRE HAVE INTRINSIC RESISTANCE TO MOST COMMONLY USED ANTIBIOTICS AND THE ABILITY TO ACQUIRE RESISTANCE TO MOST AVAILABLE ANTIBIOTICS.    Report Status 08/16/2015 FINAL  Final   Organism ID, Bacteria ESCHERICHIA COLI  Final   Organism ID, Bacteria ENTEROBACTER AEROGENES  Final   Organism ID, Bacteria PROTEUS MIRABILIS  Final   Organism ID, Bacteria ENTEROCOCCUS SPECIES  Final      Susceptibility   Enterobacter aerogenes - MIC*     CEFTAZIDIME <=1 SENSITIVE Sensitive     CEFAZOLIN >=64 RESISTANT Resistant     CEFTRIAXONE <=1 SENSITIVE Sensitive     CIPROFLOXACIN <=0.25 SENSITIVE Sensitive     GENTAMICIN <=1 SENSITIVE Sensitive     IMIPENEM 1 SENSITIVE Sensitive     TRIMETH/SULFA <=20 SENSITIVE Sensitive     * MODERATE GROWTH ENTEROBACTER AEROGENES   Escherichia coli - MIC*    AMPICILLIN <=2 SENSITIVE Sensitive     CEFTAZIDIME <=1 SENSITIVE Sensitive     CEFAZOLIN <=4 SENSITIVE Sensitive     CEFTRIAXONE <=1 SENSITIVE Sensitive     CIPROFLOXACIN <=0.25 SENSITIVE Sensitive     GENTAMICIN <=1 SENSITIVE Sensitive     IMIPENEM <=0.25 SENSITIVE Sensitive     TRIMETH/SULFA <=20 SENSITIVE Sensitive     * HEAVY GROWTH ESCHERICHIA COLI   Proteus mirabilis - MIC*    AMPICILLIN >=32 RESISTANT Resistant     CEFTAZIDIME <=1 SENSITIVE Sensitive     CEFAZOLIN 8 SENSITIVE Sensitive     CEFTRIAXONE <=1 SENSITIVE Sensitive     CIPROFLOXACIN <=0.25 SENSITIVE Sensitive     GENTAMICIN <=1 SENSITIVE Sensitive     IMIPENEM 1 SENSITIVE Sensitive     TRIMETH/SULFA <=20 SENSITIVE Sensitive     * PROTEUS MIRABILIS   Enterococcus species - MIC*    AMPICILLIN >=32 RESISTANT Resistant     VANCOMYCIN >=32 RESISTANT Resistant     GENTAMICIN SYNERGY SENSITIVE Sensitive     TETRACYCLINE Value in next row Resistant      RESISTANT>=16    * HEAVY GROWTH ENTEROCOCCUS SPECIES  MRSA PCR Screening     Status: None   Collection Time: 08/05/2015  2:38 PM  Result Value Ref Range Status   MRSA by PCR NEGATIVE NEGATIVE Final    Comment:        The GeneXpert MRSA Assay (FDA approved for NASAL specimens only), is one component of a comprehensive MRSA colonization surveillance program. It is not intended to diagnose MRSA infection nor to guide or monitor treatment for MRSA infections.   Blood culture (single)     Status: None   Collection Time: 07/28/2015  3:36 PM  Result Value Ref Range Status   Specimen Description BLOOD RIGHT ASSIST CONTROL   Final   Special Requests BOTTLES DRAWN AEROBIC AND ANAEROBIC  Final   Culture NO GROWTH 5 DAYS  Final   Report Status 08/17/2015 FINAL  Final  Wound culture     Status: None   Collection Time: 08/13/15 12:37 PM  Result Value Ref Range Status  Specimen Description WOUND  Final   Special Requests Normal  Final   Gram Stain   Final    FEW WBC SEEN TOO NUMEROUS TO COUNT GRAM NEGATIVE RODS FEW GRAM POSITIVE COCCI    Culture   Final    HEAVY GROWTH ESCHERICHIA COLI MODERATE GROWTH PROTEUS MIRABILIS LIGHT GROWTH KLEBSIELLA PNEUMONIAE MODERATE GROWTH ENTEROCOCCUS GALLINARUM CRITICAL RESULT CALLED TO, READ BACK BY AND VERIFIED WITH: [facility] BORBA AT 1042 08/16/15 DV    Report Status 08/17/2015 FINAL  Final   Organism ID, Bacteria ESCHERICHIA COLI  Final   Organism ID, Bacteria PROTEUS MIRABILIS  Final   Organism ID, Bacteria KLEBSIELLA PNEUMONIAE  Final   Organism ID, Bacteria ENTEROCOCCUS GALLINARUM  Final      Susceptibility   Escherichia coli - MIC*    AMPICILLIN >=32 RESISTANT Resistant     CEFTAZIDIME 4 RESISTANT Resistant     CEFAZOLIN >=64 RESISTANT Resistant     CEFTRIAXONE 16 RESISTANT Resistant     GENTAMICIN 2 SENSITIVE Sensitive     IMIPENEM >=16 RESISTANT Resistant     TRIMETH/SULFA <=20 SENSITIVE Sensitive     Extended ESBL POSITIVE Resistant     PIP/TAZO Value in next row Resistant      RESISTANT>=128    CIPROFLOXACIN Value in next row Sensitive      SENSITIVE<=0.25    * HEAVY GROWTH ESCHERICHIA COLI   Klebsiella pneumoniae - MIC*    AMPICILLIN Value in next row Resistant      SENSITIVE<=0.25    CEFTAZIDIME Value in next row Resistant      SENSITIVE<=0.25    CEFAZOLIN Value in next row Resistant      SENSITIVE<=0.25    CEFTRIAXONE Value in next row Resistant      SENSITIVE<=0.25    CIPROFLOXACIN Value in next row Resistant      SENSITIVE<=0.25    GENTAMICIN Value in next row Sensitive      SENSITIVE<=0.25    IMIPENEM Value in next row Resistant       SENSITIVE<=0.25    TRIMETH/SULFA Value in next row Resistant      SENSITIVE<=0.25    PIP/TAZO Value in next row Resistant      RESISTANT>=128    * LIGHT GROWTH KLEBSIELLA PNEUMONIAE   Proteus mirabilis - MIC*    AMPICILLIN Value in next row Resistant      RESISTANT>=128    CEFTAZIDIME Value in next row Sensitive      RESISTANT>=128    CEFAZOLIN Value in next row Sensitive      RESISTANT>=128    CEFTRIAXONE Value in next row Sensitive      RESISTANT>=128    CIPROFLOXACIN Value in next row Sensitive      RESISTANT>=128    GENTAMICIN Value in next row Sensitive      RESISTANT>=128    IMIPENEM Value in next row Sensitive      RESISTANT>=128    TRIMETH/SULFA Value in next row Sensitive      RESISTANT>=128    PIP/TAZO Value in next row Sensitive      SENSITIVE<=4    * MODERATE GROWTH PROTEUS MIRABILIS   Enterococcus gallinarum - MIC*    AMPICILLIN Value in next row Resistant      SENSITIVE<=4    GENTAMICIN SYNERGY Value in next row Sensitive      SENSITIVE<=4    CIPROFLOXACIN Value in next row Resistant      RESISTANT>=8    TETRACYCLINE Value in next row Resistant  RESISTANT>=16    * MODERATE GROWTH ENTEROCOCCUS GALLINARUM  Wound culture     Status: None   Collection Time: 08/15/15  2:53 PM  Result Value Ref Range Status   Specimen Description WOUND  Final   Special Requests NONE  Final   Gram Stain FEW WBC SEEN NO ORGANISMS SEEN   Final   Culture NO GROWTH 3 DAYS  Final   Report Status 08/18/2015 FINAL  Final  C difficile quick scan w PCR reflex     Status: None   Collection Time: 08/17/15 11:34 AM  Result Value Ref Range Status   C Diff antigen NEGATIVE NEGATIVE Final   C Diff toxin NEGATIVE NEGATIVE Final   C Diff interpretation Negative for C. difficile  Final  Stool culture     Status: None   Collection Time: 09/03/15  3:51 PM  Result Value Ref Range Status   Specimen Description STOOL  Final   Special Requests Immunocompromised  Final   Culture   Final     NO SALMONELLA OR SHIGELLA ISOLATED No Pathogenic E. coli detected NO CAMPYLOBACTER DETECTED    Report Status 09/07/2015 FINAL  Final  C difficile quick scan w PCR reflex     Status: None   Collection Time: 09/13/15 12:51 AM  Result Value Ref Range Status   C Diff antigen NEGATIVE NEGATIVE Final   C Diff toxin NEGATIVE NEGATIVE Final   C Diff interpretation Negative for C. difficile  Final  Culture, blood (routine x 2)     Status: None   Collection Time: 09/16/15 11:24 AM  Result Value Ref Range Status   Specimen Description BLOOD LEFT HAND  Final   Special Requests BOTTLES DRAWN AEROBIC AND ANAEROBIC  1CC  Final   Culture NO GROWTH 5 DAYS  Final   Report Status 09/21/2015 FINAL  Final  Culture, blood (routine x 2)     Status: None   Collection Time: 09/16/15 12:23 PM  Result Value Ref Range Status   Specimen Description BLOOD RIGHT HAND  Final   Special Requests BOTTLES DRAWN AEROBIC AND ANAEROBIC  1CC  Final   Culture  Setup Time   Final    GRAM POSITIVE COCCI AEROBIC BOTTLE ONLY CRITICAL RESULT CALLED TO, READ BACK BY AND VERIFIED WITH: TESS THOMAS,RN 09/17/2015 0631 BY JRS.    Culture   Final    ENTEROCOCCUS FAECALIS AEROBIC BOTTLE ONLY VRE HAVE INTRINSIC RESISTANCE TO MOST COMMONLY USED ANTIBIOTICS AND THE ABILITY TO ACQUIRE RESISTANCE TO MOST AVAILABLE ANTIBIOTICS. CRITICAL RESULT CALLED TO, READ BACK BY AND VERIFIED WITH: CHERYL SMITH AT 1610 09/19/15 DV    Report Status 09/21/2015 FINAL  Final   Organism ID, Bacteria ENTEROCOCCUS FAECALIS  Final      Susceptibility   Enterococcus faecalis - MIC*    AMPICILLIN <=2 SENSITIVE Sensitive     LINEZOLID 2 SENSITIVE Sensitive     CIPROFLOXACIN Value in next row Resistant      RESISTANT>=8    TETRACYCLINE Value in next row Resistant      RESISTANT>=16    VANCOMYCIN Value in next row Resistant      RESISTANT>=32    GENTAMICIN SYNERGY Value in next row Resistant      RESISTANT>=32    * ENTEROCOCCUS FAECALIS  Culture,  respiratory (NON-Expectorated)     Status: None   Collection Time: 09/16/15  3:50 PM  Result Value Ref Range Status   Specimen Description TRACHEAL ASPIRATE  Final   Special Requests Immunocompromised  Final   Gram Stain   Final    FEW WBC SEEN GOOD SPECIMEN - 80-90% WBCS RARE GRAM NEGATIVE RODS    Culture LIGHT GROWTH PSEUDOMONAS AERUGINOSA  Final   Report Status 09/19/2015 FINAL  Final   Organism ID, Bacteria PSEUDOMONAS AERUGINOSA  Final      Susceptibility   Pseudomonas aeruginosa - MIC*    CEFTAZIDIME 8 SENSITIVE Sensitive     CIPROFLOXACIN 2 INTERMEDIATE Intermediate     GENTAMICIN >=16 RESISTANT Resistant     IMIPENEM >=16 RESISTANT Resistant     PIP/TAZO Value in next row Sensitive      SENSITIVE32    CEFEPIME Value in next row Sensitive      SENSITIVE8    LEVOFLOXACIN Value in next row Resistant      RESISTANT>=8    * LIGHT GROWTH PSEUDOMONAS AERUGINOSA  Culture, expectorated sputum-assessment     Status: None   Collection Time: 09/22/15  2:09 PM  Result Value Ref Range Status   Specimen Description ENDOTRACHEAL  Final   Special Requests Normal  Final   Sputum evaluation THIS SPECIMEN IS ACCEPTABLE FOR SPUTUM CULTURE  Final   Report Status 09/24/2015 FINAL  Final  Culture, respiratory (NON-Expectorated)     Status: None   Collection Time: 09/22/15  2:09 PM  Result Value Ref Range Status   Specimen Description ENDOTRACHEAL  Final   Special Requests Normal Reflexed from [id number]  Final   Gram Stain   Final    FEW WBC SEEN FEW GRAM NEGATIVE RODS POOR SPECIMEN - LESS THAN 70% WBCS    Culture   Final    MODERATE GROWTH PSEUDOMONAS AERUGINOSA LIGHT GROWTH KLEBSIELLA PNEUMONIAE REFER TO SENSITIVITIES FROM PREVIOUS CULTURE FOR ORG 2    Report Status 10/01/2015 FINAL  Final   Organism ID, Bacteria PSEUDOMONAS AERUGINOSA  Final   Organism ID, Bacteria KLEBSIELLA PNEUMONIAE  Final      Susceptibility   Pseudomonas aeruginosa - MIC*    CEFTAZIDIME 4 SENSITIVE Sensitive      CIPROFLOXACIN 2 INTERMEDIATE Intermediate     GENTAMICIN >=16 RESISTANT Resistant     IMIPENEM >=16 RESISTANT Resistant     PIP/TAZO Value in next row Sensitive      SENSITIVE16    * MODERATE GROWTH PSEUDOMONAS AERUGINOSA  Tissue culture     Status: None   Collection Time: 09/24/15  6:44 AM  Result Value Ref Range Status   Specimen Description BONE  Final   Special Requests Normal  Final   Gram Stain MODERATE WBC SEEN FEW GRAM NEGATIVE RODS   Final   Culture   Final    MODERATE GROWTH ESCHERICHIA COLI MODERATE GROWTH KLEBSIELLA PNEUMONIAE ESBL-EXTENDED SPECTRUM BETA LACTAMASE-THE ORGANISM IS RESISTANT TO PENICILLINS, CEPHALOSPORINS AND AZTREONAM ACCORDING TO CLSI M100-S15 VOL.25 N01 JAN 2005. ORGANISM 1 This organism isolate is resistant to one or more antiotic agents in three or more antimicrobial categories.  Suggest Infectious Disease consult.   ORGANISM 2 CRITICAL RESULT CALLED TO, READ BACK BY AND VERIFIED WITH: RN Mardene Celeste LINDSAY 09/27/15 1005AM    Report Status 09/28/2015 FINAL  Final   Organism ID, Bacteria ESCHERICHIA COLI  Final   Organism ID, Bacteria KLEBSIELLA PNEUMONIAE  Final      Susceptibility   Escherichia coli - MIC*    AMPICILLIN >=32 RESISTANT Resistant     CEFTAZIDIME 4 RESISTANT Resistant     CEFAZOLIN >=64 RESISTANT Resistant     CEFTRIAXONE 8 RESISTANT Resistant     GENTAMICIN <=1  SENSITIVE Sensitive     IMIPENEM 8 RESISTANT Resistant     TRIMETH/SULFA <=20 SENSITIVE Sensitive     Extended ESBL POSITIVE Resistant     PIP/TAZO Value in next row Resistant      RESISTANT>=128    * MODERATE GROWTH ESCHERICHIA COLI   Klebsiella pneumoniae - MIC*    AMPICILLIN Value in next row Resistant      RESISTANT>=128    CEFTAZIDIME Value in next row Resistant      RESISTANT>=128    CEFAZOLIN Value in next row Resistant      RESISTANT>=128    CEFTRIAXONE Value in next row Resistant      RESISTANT>=128    CIPROFLOXACIN Value in next row Resistant       RESISTANT>=128    GENTAMICIN Value in next row Resistant      RESISTANT>=128    IMIPENEM Value in next row Resistant      RESISTANT>=128    TRIMETH/SULFA Value in next row Sensitive      RESISTANT>=128    PIP/TAZO Value in next row Resistant      RESISTANT>=128    * MODERATE GROWTH KLEBSIELLA PNEUMONIAE  Anaerobic culture     Status: None   Collection Time: 09/24/15  3:55 PM  Result Value Ref Range Status   Specimen Description BONE  Final   Special Requests Normal  Final   Culture NO ANAEROBES ISOLATED  Final   Report Status 09/29/2015 FINAL  Final  Culture, fungus without smear     Status: None   Collection Time: 09/24/15  3:55 PM  Result Value Ref Range Status   Specimen Description BONE  Final   Special Requests Normal  Final   Culture NO FUNGUS ISOLATED AFTER 24 DAYS  Final   Report Status 10/18/2015 FINAL  Final  C difficile quick scan w PCR reflex     Status: None   Collection Time: 10/07/15  2:02 PM  Result Value Ref Range Status   C Diff antigen NEGATIVE NEGATIVE Final   C Diff toxin NEGATIVE NEGATIVE Final   C Diff interpretation Negative for C. difficile  Final  Culture, blood (routine x 2)     Status: None   Collection Time: 10/16/15  9:52 AM  Result Value Ref Range Status   Specimen Description BLOOD LEFT HAND  Final   Special Requests   Final    BOTTLES DRAWN AEROBIC AND ANAEROBIC  AER 6CC ANA 4CC   Culture NO GROWTH 6 DAYS  Final   Report Status 10/22/2015 FINAL  Final  Culture, blood (routine x 2)     Status: None   Collection Time: 10/16/15 12:25 PM  Result Value Ref Range Status   Specimen Description BLOOD LEFT HAND  Final   Special Requests BOTTLES DRAWN AEROBIC AND ANAEROBIC  5CC  Final   Culture NO GROWTH 6 DAYS  Final   Report Status 10/22/2015 FINAL  Final  Culture, expectorated sputum-assessment     Status: None   Collection Time: 10/18/15  1:15 PM  Result Value Ref Range Status   Specimen Description SPUTUM  Final   Special Requests NONE   Final   Sputum evaluation THIS SPECIMEN IS ACCEPTABLE FOR SPUTUM CULTURE  Final   Report Status 10/18/2015 FINAL  Final  Culture, respiratory (NON-Expectorated)     Status: None   Collection Time: 10/18/15  1:15 PM  Result Value Ref Range Status   Specimen Description SPUTUM  Final   Special Requests NONE Reflexed from  [id number]  Final   Gram Stain   Final    GOOD SPECIMEN - 80-90% WBCS MODERATE WBC SEEN MANY GRAM NEGATIVE RODS    Culture   Final    HEAVY GROWTH PSEUDOMONAS AERUGINOSA MODERATE GROWTH KLEBSIELLA PNEUMONIAE CRITICAL RESULT CALLED TO, READ BACK BY AND VERIFIED WITHRod Mae AT 0454 10/21/15 DV KLEBSIELLA PNEUMONIAE This organism isolate is resistant to one or more antiotic agents in three or more antimicrobial categories.  Suggest Infectious Disease  consult.      Report Status 10/22/2015 FINAL  Final   Organism ID, Bacteria PSEUDOMONAS AERUGINOSA  Final   Organism ID, Bacteria KLEBSIELLA PNEUMONIAE  Final      Susceptibility   Klebsiella pneumoniae - MIC*    AMPICILLIN >=32 RESISTANT Resistant     CEFAZOLIN >=64 RESISTANT Resistant     CEFTRIAXONE >=64 RESISTANT Resistant     CIPROFLOXACIN >=4 RESISTANT Resistant     GENTAMICIN >=16 RESISTANT Resistant     IMIPENEM >=16 RESISTANT Resistant     NITROFURANTOIN >=512 RESISTANT Resistant     TRIMETH/SULFA <=20 SENSITIVE Sensitive     * MODERATE GROWTH KLEBSIELLA PNEUMONIAE   Pseudomonas aeruginosa - MIC*    CEFTAZIDIME 4 SENSITIVE Sensitive     CIPROFLOXACIN >=4 RESISTANT Resistant     GENTAMICIN 8 INTERMEDIATE Intermediate     IMIPENEM >=16 RESISTANT Resistant     PIP/TAZO Value in next row Sensitive      SENSITIVE32    * HEAVY GROWTH PSEUDOMONAS AERUGINOSA  C difficile quick scan w PCR reflex     Status: None   Collection Time: 10/18/15  4:39 PM  Result Value Ref Range Status   C Diff antigen NEGATIVE NEGATIVE Final   C Diff toxin NEGATIVE NEGATIVE Final   C Diff interpretation Negative for C. difficile   Final    Coagulation Studies: No results for input(s): LABPROT, INR in the last 72 hours.  Urinalysis: No results for input(s): COLORURINE, LABSPEC, PHURINE, GLUCOSEU, HGBUR, BILIRUBINUR, KETONESUR, PROTEINUR, UROBILINOGEN, NITRITE, LEUKOCYTESUR in the last 72 hours.  Invalid input(s): APPERANCEUR    Imaging: No results found.   Medications:   . feeding supplement (JEVITY 1.5 CAL/FIBER) 1,000 mL (10/31/15 0934)  . norepinephrine Stopped (10/08/15 0530)   . albumin human  25 g Intravenous Once  . amantadine  200 mg Per Tube Q7 days  . antiseptic oral rinse  7 mL Mouth Rinse QID  . aspirin  81 mg Oral Daily  . chlorhexidine gluconate  15 mL Mouth Rinse BID  . epoetin (EPOGEN/PROCRIT) injection  10,000 Units Intravenous Q M,W,F-HD  . escitalopram  10 mg Oral Daily  . feeding supplement (PRO-STAT SUGAR FREE 64)  30 mL Oral 6 X Daily  . folic acid  1 mg Oral Daily  . free water  20 mL Per Tube 6 times per day  . heparin subcutaneous  5,000 Units Subcutaneous Q12H  . hydrocortisone  10 mg Per Tube BID  . insulin aspart  0-9 Units Subcutaneous 6 times per day  . lidocaine  1 patch Transdermal Q24H  . midodrine  5 mg Per Tube TID WC  . multivitamin  5 mL Oral Daily  . pantoprazole sodium  40 mg Per Tube Daily  . saccharomyces boulardii  250 mg Oral BID  . sodium chloride  10-40 mL Intracatheter Q12H  . sulfamethoxazole-trimethoprim  2.5 tablet Per Tube q1800  . thiamine  100 mg Per Tube Daily   sodium chloride, sodium chloride, acetaminophen (TYLENOL) oral  liquid 160 mg/5 mL, HYDROmorphone (DILAUDID) injection, ipratropium-albuterol, loperamide, morphine injection, ondansetron (ZOFRAN) IV, oxyCODONE, sodium chloride, zinc oxide  Assessment/ Plan:  49 y.o. 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 Admitted on 07/21/2015  1. End-stage renal disease on hemodialysis on HD MWF. The patient has been on dialysis since October of 2014. R IJ permcath. Dialysis treatment with IV albumin for oncotic support. Midodrine and hydrocortisone ordered for blood pressure support.  - Pt due for HD today, hyperkalemia noted.  May need to alter tube feeds if K remains an issue.  2. Anemia of CKD. Hemoglobin 7.7, continue epogen 10000 units IV with HD.  3. Secondary hyperparathyroidism: PTH 50, phos normal, continue to montior bone mineral metabolism parameters.   4. Sepsis: VRE in blood. Bone cultures E. Coli and klebsiella - Treatment as per ID and ICU team: daptomycin completed on 11/10 and now on high dose trimethoprim/sulfamethoxazole.   5. Acute resp failure:  fio2 30%, remains on the vent, had recent pseudomonas in the sputum.   6. Generalized edema - Fluid removal with dialysis is limited due to hypotension   LOS: 80 Barnard Sharps 12/12/20169:53 AM

## 2015-10-31 NOTE — Care Management (Signed)
Remains on full ventilatory support.  Husband did refuse pursuing PEG tube placement.  It is reported he verbally relayed the goal is for patient to be weaned from the ventilator and start taking oral nutrition.  It has been substantiated that patient is not able to take in enough oral nutrition to maintain nutrition.  This has been discussed with patient's husband on previous occassions.  Patient is not candidate for LTAC- no bed offers

## 2015-10-31 NOTE — Progress Notes (Signed)
[facility] Critical Care Medicine Progess Note  Name: Courtney [name] MRN: [id number] DOB: [birth year]    ADMISSION DATE:  08/03/2015   INITIAL PRESENTATION:  49 AAF who has been in medical facilities (hosp, LTAC, rehab) for 2 yrs following gastric bypass surgery with multiple complications. Now with chronic trach, ESRD, profound debilitation, severe sacral pressure ulcer. Was seemingly making progress and transferred to rehab facility approx one week prior to this admission. She was sent to [facility] ED with AMS and hypotension. Working dx of severe sepsis/septic shock due to infected sacral pressure ulcer. Since admission. Her course is been very complicated with numerous complications including septic shock and GI bleeding. Now with failure to wean from vent    INTERVAL HISTORY: alert, noted to have some mild tachypnea when placed on lower settings of pressure support. Placed back on the vent for the majority of the day and night. We will attempt pressure support again today,plan for HD today I have spoken to the husband reintroduced ID of a possible PEG tube placement in the near future, he states that she does have a complex GI anatomy and the obstacle in the past was finding a surgeon suitable to place an NG tube, at this time he again would like patient to be liberated from the vent so that she may move forward to taking oral intake.  Summary of MAJOR EVENTS/TEST RESULTS: Admission 02/07/14-05/07/14 Admission 07/21/14-09/06/14 Discharged to [facility]. Pt had palliative consult at that time, were asked to sign off by husband.  09/23 CT head: NAD 09/23 EEG: no epileptiform activity 09/23 PRBCs for Hgb 6.4 09/24 bedside debridement of sacral wound. Abscess drained 09/25 Off vasopressors. More alert. No distress. Worsening thrombocytopenia. Vanc DC'd 09/29  Dr. Sampson Goon (I.D) excused from the case by patient's husband. 10/02 MRI -multiple infarcts 10/03 tracheal bleeding- transfused  platelets 10/03 hospitalist service excused from the case by patient's husband 10/03 Echocardiogram ejection fraction was 55-60%, pulmonary systolic pressure was 39 mmHg 82/95 restart TF's at lower rate, attempt reg HD  10/12 Transferred to med-surg floor. Remains on PCCM service 10/14 SLP eval: pt unable to tolerate PMV adequately 10/17-will re-attempt PM valve-discussed with Speech therapist 10/18 passed swallow eval-start pureed thick foods no thin liquids-continue NG feeds 10/19 transferred to step down for sepsis/aspiration pneumonia 10/19 cxr shows RLL opacity 10/21 sacral decub debride at bedside by surgery 10/22 started back on vasopressors while on HD 10/27 CT with osteo, R hip fx, unable to identify tip of dubhoff tube - sent for fluoro study 10/28 Ortho consultation: I do not feel that she is a surgical candidate. Therefore, I feel that it would best to manage this fracture nonsurgically and allow it to heal by itself over time, which it should.  10/28 Gen Surg consultation: Due to the lack of free air or free flow of contrast and the peritoneum there is no indication for any surgical intervention on this. Would recommend pulling back the feeding tube 1-2 cm. Would recheck an abdominal film to confirm no pneumoperitoneum in the morning. Absence of any changes okay to continue using Dobbhoff for feeding and medications. 10/28 gastrograffin study: The study confirms that the feeding tube pes perforated through the duodenum and the tip is within a cavity that fills with injected contrast. The cavity does appear walled off 11/4 refuses oral feeds, continue TF's CT reviewed: T8-T9 discitis/osteomyelitis, RT HIP FRACTURE 11/5 placed back on vasopressors, placed back on Vent due to resp acidosis, levophed turned off 11/6 afternoon 11/5 PM  Trach changed out due to cuff leak, #6 Shiley cuffed, 11/6 dubhoff occluded, removed and replaced, new dubhoff shows tube in the antral stomach 11/15  refusing to take oral feeds 11/18 placed back on Vent for increased WOB,SOB. 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 10/18/2015; the patient developed a new left basal pneumonia; with recurrent sepsis.  10/19/2015; fevers resolved with IV acetaminophen. Tube feeds changed from vital to Jevity.  12/7 CXR with stable b/l opacities.    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 Pneumoniae, P. Mirabilis, VRE Wound 9/26 >> No growth Blood 9/23 >> NEG CDiff 9/27>>neg Stool Cx 10/15>> negative Cdiff 10/25>>neg Trach Aspirate 10/28>> light growth pseudomonas Blood 10/28 >> 1/2 GPC >>  Sputum cultures obtained 09/22/15 due to mucus plugging>>Pseudomonas BONE TISSUE Cx 11/3>>E.coli (ESBL), k. Pneumonia (daptomycin and septra) Bld Cx 11/27>>negative thus far.  Trach Asp 11/27>> grew out Pseudomonas, and Klebsiella pneumonia.  ANTIMICROBIALS:  Aztreonam 9/23 >> 9/24 Vanc 9/23 >> 9/25 Vanc 9/26>>9/27 Daptomycin 9/27>> 10/12, 10/28>> off Meropenem 9/23 >> 10/14, 10/28 >> 10/31 Septra 11/8>> Zosyn 11/27,>> 11/30. Levaquin 11/29>> 11/29. Ceftaz 12/1>>  VITAL SIGNS: Temp:  [99.2 F (37.3 C)-99.8 F (37.7 C)] 99.4 F (37.4 C) (12/12 0400) Pulse Rate:  [89-99] 97 (12/12 0700) Resp:  [14-28] 23 (12/12 0700) BP: (104-150)/(50-96) 110/80 mmHg (12/12 0700) SpO2:  [96 %-100 %] 97 % (12/12 0700) FiO2 (%):  [30 %] 30 % (12/12 0801) HEMODYNAMICS:   VENTILATOR SETTINGS: Vent Mode:  [-] PRVC FiO2 (%):  [30 %] 30 % Set Rate:  [14 bmp] 14 bmp Vt Set:  [530 mL]  530 mL PEEP:  [5 cmH20] 5 cmH20 INTAKE / OUTPUT:  Intake/Output Summary (Last 24 hours) at 10/31/15 0959 Last data filed at 10/31/15 0700  Gross per 24 hour  Intake   1350 ml  Output      0 ml  Net   1350 ml   PHYSICAL EXAMINATION: Physical Examination:   VS: BP 110/80 mmHg  Pulse 97  Temp(Src) 99.4 F (37.4 C) (Oral)  Resp 23  Ht  (1.753 m)  Wt 229 lb 4.5 oz (104 kg)  BMI 33.84 kg/m2  SpO2 97%  LMP  (LMP Unknown)  General Appearance: No resp distress, coarse upper airway sounds, on vent Neuro: profoundly diffusely weak, alert today, sleeping but easily arousable HEENT: cushingoid facies, PERRLA, EOM intact Neck: trach site clean Pulmonary: clear anteriorly Cardiovascular: reg, + syst murmur Abdomen: soft, NT, +BS Extremities: warm, R>L UE edema    LABORATORY PANEL:   CBC  Recent Labs Lab 10/31/15 0448  WBC 9.4  HGB 7.7*  HCT 24.4*  PLT 257    Chemistries   Recent Labs Lab 10/31/15 0448  NA 144  K 6.0*  CL 101  CO2 34*  GLUCOSE 109*  BUN 92*  CREATININE 1.43*  CALCIUM 8.7*  PHOS 5.2*       IMPRESSION/PLAN (current major issues):  Recurrent sepsis, due to pneumonia.   Anemia with macrocytosis -Uncertain if this is due to chronic slow GI bleed or simple critical care associated anemia. She  has already receiving folate, thiamine, and multivitamin. Her anemia has not responded to oral medications, this may be due to her GI malabsorption. Will also check Vit D levels (discussed with nephrology) - PTH, 25OH, 1,25OH Vit d  Pneumonia with sepsis-better. -Chest x-ray shows new left lower lobe infiltrate,which is now improving.  Sputum cultures show pseudomonas. The patient is once again, vent dependent. She has developed again recurrent sepsis.Marland Kitchen. Sputum cultures showing Pseudomonas, and Klebsiella, currently covered with this. Ceftaz total 10 days, stop date 12/11 Weaning trial daily off MV - currently on PSV trials with plan to transition back  to TCT  Worsening sacral decubitus ulcer, stage IV. -The patient is already had a sacral bone scraping, and has been placed on Septra for antibiotic, per ID recommendations. Her sacral decubitus does not appear to be improving and in fact appears to be worsening. In addition, she has several new smaller areas of tissue breakdown.  End-stage renal disease. -Continue on hemodialysis per nephrology.  Diarrhea. -This has improved since changing her loperamide 2 twice daily scheduled, and changing her tube feeds back to Jevity. Continue loperamide to when necessary.   Trach Care -changed on 11/5 to #6 Shiley due to cuff leak   Patient appears to be stable for transfer to an LTAC facility at this point in time.   Additional Chronic issues and complicating factors during this admission.  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  Thrombocytopenia, resolved - HIT panel negative 9/25 Recurrent Severe sepsis, due to sacral pressure ulcer DM 2, controlled.  Chronic steroid therapy, for a history of of adrenal insufficiency Incidental finding of R hip fx - conservative mgmt. Waxing and waning encephalopathy - she was evaluated by both the psychiatry and neurology services, and it was deemed that the patient lacks decisional capacity at this time. 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 Husband declined TEE Profound deconditioning-criticall illness neuropathy Chronic pain    The Patient requires high complexity decision making for assessment and support, frequent evaluation and titration of therapies, application of advanced monitoring technologies and extensive interpretation of multiple databases. Critical Care Time devoted to patient care services described in this note is 35 minutes.   Overall, patient is critically ill, prognosis is guarded.  Patient with Multiorgan failure and at high risk for cardiac arrest and death.     Lucie LeatherKurian David Melida Northington, M.D.  [facility]  Medical Director [facility] Medical Director [facility] Card[facility]-P[facility] Department

## 2015-10-31 NOTE — Progress Notes (Signed)
MEDICATION RELATED CONSULT NOTE - Follow up   Pharmacy Consult for Renal dosing  Allergies  Allergen Reactions  . Contrast Media [Iodinated Diagnostic Agents] Anaphylaxis  . Ampicillin Rash    Patient Measurements: Height:  (SCALE BROKE) Weight:  (UTA d/t not able to read bed scale readout.) IBW/kg (Calculated) : 66.2  Vital Signs: Temp: 98.7 F (37.1 C) (12/12 0900) Temp Source: Oral (12/12 0900) BP: 110/80 mmHg (12/12 0700) Pulse Rate: 97 (12/12 0700)  Recent Labs  10/30/15 0500 10/31/15 0448  WBC 8.1 9.4  HGB 7.7* 7.7*  HCT 24.6* 24.4*  PLT 249 257  CREATININE  --  1.43*  PHOS  --  5.2*  ALBUMIN  --  2.0*   Estimated Creatinine Clearance: 60.4 mL/min (by C-G formula based on Cr of 1.43).   Assessment: 49 yo patient on HD. Pharmacy consulted for renal dosing of medications.   Plan:  No medications require adjustment at present. Pharmacy will continue to monitor patients medications and dose adjust as needed.    Luisa HartScott Niyam Bisping, PharmD 10/31/2015

## 2015-11-01 LAB — GLUCOSE, CAPILLARY
GLUCOSE-CAPILLARY: 128 mg/dL — AB (ref 65–99)
GLUCOSE-CAPILLARY: 89 mg/dL (ref 65–99)
GLUCOSE-CAPILLARY: 97 mg/dL (ref 65–99)
GLUCOSE-CAPILLARY: 127 mg/dL — AB (ref 65–99)
GLUCOSE-CAPILLARY: 107 mg/dL — AB (ref 65–99)
Glucose-Capillary: 118 mg/dL — ABNORMAL HIGH (ref 65–99)

## 2015-11-01 LAB — BASIC METABOLIC PANEL
ANION GAP: 10 (ref 5–15)
BUN: 60 mg/dL — ABNORMAL HIGH (ref 6–20)
CALCIUM: 8.8 mg/dL — AB (ref 8.9–10.3)
CO2: 35 mmol/L — ABNORMAL HIGH (ref 22–32)
Chloride: 102 mmol/L (ref 101–111)
Creatinine, Ser: 1.11 mg/dL — ABNORMAL HIGH (ref 0.44–1.00)
GFR, EST NON AFRICAN AMERICAN: 57 mL/min — AB (ref >60)
Glucose, Bld: 126 mg/dL — ABNORMAL HIGH (ref 65–99)
POTASSIUM: 5.2 mmol/L — AB (ref 3.5–5.1)
Sodium: 147 mmol/L — ABNORMAL HIGH (ref 135–145)

## 2015-11-01 LAB — CBC
HCT: 26.4 % — ABNORMAL LOW (ref 35.0–47.0)
HEMOGLOBIN: 8 g/dL — AB (ref 12.0–16.0)
MCH: 29.6 pg (ref 26.0–34.0)
MCHC: 30.4 g/dL — ABNORMAL LOW (ref 32.0–36.0)
MCV: 97.4 fL (ref 80.0–100.0)
Platelets: 250 10*3/uL (ref 150–440)
RBC: 2.71 MIL/uL — AB (ref 3.80–5.20)
RDW: 18.8 % — ABNORMAL HIGH (ref 11.5–14.5)
WBC: 11.9 10*3/uL — ABNORMAL HIGH (ref 3.6–11.0)

## 2015-11-01 LAB — MAGNESIUM: MAGNESIUM: 2.1 mg/dL (ref 1.7–2.4)

## 2015-11-01 LAB — PHOSPHORUS: PHOSPHORUS: 4.3 mg/dL (ref 2.5–4.6)

## 2015-11-01 MED ORDER — DAKINS (1/4 STRENGTH) 0.125 % EX SOLN
Freq: Two times a day (BID) | CUTANEOUS | Status: DC
  Administered 2015-11-01 – 2015-11-03 (×6)
  Administered 2015-11-04: 1
  Administered 2015-11-05 – 2015-11-13 (×18)
  Administered 2015-11-13: 1
  Administered 2015-11-14 – 2015-11-24 (×20)
  Administered 2015-11-24: 1
  Administered 2015-11-25 – 2015-11-27 (×5)
  Administered 2015-11-27: 1
  Administered 2015-11-28 – 2015-12-06 (×17)
  Filled 2015-11-01 (×17): qty 473

## 2015-11-01 MED ORDER — ALTEPLASE 2 MG IJ SOLR
2.0000 mg | Freq: Once | INTRAMUSCULAR | Status: AC
  Administered 2015-11-01: 2 mg
  Filled 2015-11-01: qty 2

## 2015-11-01 MED ORDER — SODIUM CHLORIDE 0.9 % IJ SOLN
INTRAMUSCULAR | Status: AC
  Administered 2015-11-01: 10 mL
  Filled 2015-11-01: qty 10

## 2015-11-01 NOTE — Progress Notes (Signed)
MEDICATION RELATED CONSULT NOTE - Follow up   Pharmacy Consult for Renal dosing  Allergies  Allergen Reactions  . Contrast Media [Iodinated Diagnostic Agents] Anaphylaxis  . Ampicillin Rash    Patient Measurements: Height:  (SCALE BROKE) Weight:  (scales broken) IBW/kg (Calculated) : 66.2  Vital Signs: Temp: 98.6 F (37 C) (12/13 0800) Temp Source: Axillary (12/13 0800) BP: 129/70 mmHg (12/13 0600) Pulse Rate: 93 (12/13 0600)  Recent Labs  10/30/15 0500 10/31/15 0448  WBC 8.1 9.4  HGB 7.7* 7.7*  HCT 24.6* 24.4*  PLT 249 257  CREATININE  --  1.43*  PHOS  --  5.2*  ALBUMIN  --  2.0*   Estimated Creatinine Clearance: 60.4 mL/min (by C-G formula based on Cr of 1.43).   Assessment: 49 yo patient on HD. Pharmacy consulted for renal dosing of medications.   Plan:  No medications require adjustment at present. Pharmacy will continue to monitor patients medications and dose adjust as needed.    Luisa HartScott Verdun Rackley, PharmD 11/01/2015

## 2015-11-01 NOTE — Progress Notes (Signed)
Subjective:  Pt had HD yesterday. No new labs today. Was run on a 1K bath for a period of time yesterday. Remains on the vent at 30% fio2.   Objective:  Vital signs in last 24 hours:  Temp:  [98.2 F (36.8 C)-99.5 F (37.5 C)] 98.6 F (37 C) (12/13 0800) Pulse Rate:  [90-105] 93 (12/13 0600) Resp:  [0-35] 24 (12/13 0600) BP: (108-131)/(47-97) 129/70 mmHg (12/13 0600) SpO2:  [96 %-100 %] 98 % (12/13 0600) FiO2 (%):  [30 %] 30 % (12/13 0800)  Weight change:  Filed Weights   10/29/15 0500  Weight: 104 kg (229 lb 4.5 oz)    Intake/Output: I/O last 3 completed shifts: In: 2170 [NG/GT:2070; IV Piggyback:100] Out: 1900 [Other:1900]   Intake/Output this shift:  Total I/O In: 70 [NG/GT:70] Out: -   Physical Exam: General: critically ill appearing  Head/ENT:  OM moist, NG tube  Eyes: anicteric  Neck: Tracheostomy in place  Lungs:  Scattered rhonchi, vent assisted  fio2 30%  Heart: S1S2 no rubs  Abdomen:  Soft, nontender  Extremities: 2+ dependent edema, anasarca  Neurologic: Resting comfortably at the moment  Access:  R IJ permcath.       Basic Metabolic Panel:  Recent Labs Lab 10/26/15 1116 10/28/15 1050 10/30/15 1213 10/31/15 0448  NA 139 140  --  144  K 5.6* 5.8* 5.6* 6.0*  CL 97* 97*  --  101  CO2 33* 33*  --  34*  GLUCOSE 104* 152*  --  109*  BUN 83* 72*  --  92*  CREATININE 1.29* 1.25*  --  1.43*  CALCIUM 8.8* 8.7*  --  8.7*  PHOS 4.5 4.7*  --  5.2*    Liver Function Tests:  Recent Labs Lab 10/26/15 1116 10/28/15 1050 10/31/15 0448  ALBUMIN 2.2* 2.1* 2.0*   No results for input(s): LIPASE, AMYLASE in the last 168 hours. No results for input(s): AMMONIA in the last 168 hours.  CBC:  Recent Labs Lab 10/26/15 1116 10/28/15 1050 10/30/15 0500 10/31/15 0448  WBC 9.0 8.2 8.1 9.4  HGB 8.0* 7.9* 7.7* 7.7*  HCT 25.8* 25.1* 24.6* 24.4*  MCV 96.1 96.0 97.4 95.9  PLT 215 248 249 257    Cardiac Enzymes: No results for input(s): CKTOTAL,  CKMB, CKMBINDEX, TROPONINI in the last 168 hours.  BNP: Invalid input(s): POCBNP  CBG:  Recent Labs Lab 10/31/15 1615 10/31/15 1951 10/31/15 2354 11/01/15 0439 11/01/15 0725  GLUCAP 110* 106* 118* 128* 89    Microbiology: Results for orders placed or performed during the hospital encounter of 02-Sep-2015  Blood Culture (routine x 2)     Status: None   Collection Time: 09-02-15  8:51 AM  Result Value Ref Range Status   Specimen Description BLOOD [name]  Final   Special Requests BOTTLES DRAWN AEROBIC AND ANAEROBIC  3CC  Final   Culture NO GROWTH 5 DAYS  Final   Report Status 08/17/2015 FINAL  Final  Blood Culture (routine x 2)     Status: None   Collection Time: 09/02/15  9:20 AM  Result Value Ref Range Status   Specimen Description BLOOD LEFT ARM  Final   Special Requests BOTTLES DRAWN AEROBIC AND ANAEROBIC  1CC  Final   Culture NO GROWTH 5 DAYS  Final   Report Status 08/17/2015 FINAL  Final  Wound culture     Status: None   Collection Time: September 02, 2015  9:20 AM  Result Value Ref Range Status   Specimen Description  DECUBITIS  Final   Special Requests Normal  Final   Gram Stain   Final    FEW WBC SEEN MANY GRAM NEGATIVE RODS RARE GRAM POSITIVE COCCI    Culture   Final    HEAVY GROWTH ESCHERICHIA COLI MODERATE GROWTH ENTEROBACTER AEROGENES PROTEUS MIRABILIS HEAVY GROWTH ENTEROCOCCUS SPECIES VRE HAVE INTRINSIC RESISTANCE TO MOST COMMONLY USED ANTIBIOTICS AND THE ABILITY TO ACQUIRE RESISTANCE TO MOST AVAILABLE ANTIBIOTICS.    Report Status 08/16/2015 FINAL  Final   Organism ID, Bacteria ESCHERICHIA COLI  Final   Organism ID, Bacteria ENTEROBACTER AEROGENES  Final   Organism ID, Bacteria PROTEUS MIRABILIS  Final   Organism ID, Bacteria ENTEROCOCCUS SPECIES  Final      Susceptibility   Enterobacter aerogenes - MIC*    CEFTAZIDIME <=1 SENSITIVE Sensitive     CEFAZOLIN >=64 RESISTANT Resistant     CEFTRIAXONE <=1 SENSITIVE Sensitive     CIPROFLOXACIN <=0.25 SENSITIVE  Sensitive     GENTAMICIN <=1 SENSITIVE Sensitive     IMIPENEM 1 SENSITIVE Sensitive     TRIMETH/SULFA <=20 SENSITIVE Sensitive     * MODERATE GROWTH ENTEROBACTER AEROGENES   Escherichia coli - MIC*    AMPICILLIN <=2 SENSITIVE Sensitive     CEFTAZIDIME <=1 SENSITIVE Sensitive     CEFAZOLIN <=4 SENSITIVE Sensitive     CEFTRIAXONE <=1 SENSITIVE Sensitive     CIPROFLOXACIN <=0.25 SENSITIVE Sensitive     GENTAMICIN <=1 SENSITIVE Sensitive     IMIPENEM <=0.25 SENSITIVE Sensitive     TRIMETH/SULFA <=20 SENSITIVE Sensitive     * HEAVY GROWTH ESCHERICHIA COLI   Proteus mirabilis - MIC*    AMPICILLIN >=32 RESISTANT Resistant     CEFTAZIDIME <=1 SENSITIVE Sensitive     CEFAZOLIN 8 SENSITIVE Sensitive     CEFTRIAXONE <=1 SENSITIVE Sensitive     CIPROFLOXACIN <=0.25 SENSITIVE Sensitive     GENTAMICIN <=1 SENSITIVE Sensitive     IMIPENEM 1 SENSITIVE Sensitive     TRIMETH/SULFA <=20 SENSITIVE Sensitive     * PROTEUS MIRABILIS   Enterococcus species - MIC*    AMPICILLIN >=32 RESISTANT Resistant     VANCOMYCIN >=32 RESISTANT Resistant     GENTAMICIN SYNERGY SENSITIVE Sensitive     TETRACYCLINE Value in next row Resistant      RESISTANT>=16    * HEAVY GROWTH ENTEROCOCCUS SPECIES  MRSA PCR Screening     Status: None   Collection Time: 06-Sep-2015  2:38 PM  Result Value Ref Range Status   MRSA by PCR NEGATIVE NEGATIVE Final    Comment:        The GeneXpert MRSA Assay (FDA approved for NASAL specimens only), is one component of a comprehensive MRSA colonization surveillance program. It is not intended to diagnose MRSA infection nor to guide or monitor treatment for MRSA infections.   Blood culture (single)     Status: None   Collection Time: 09/06/15  3:36 PM  Result Value Ref Range Status   Specimen Description BLOOD RIGHT ASSIST CONTROL  Final   Special Requests BOTTLES DRAWN AEROBIC AND ANAEROBIC  Final   Culture NO GROWTH 5 DAYS  Final   Report Status 08/17/2015 FINAL  Final   Wound culture     Status: None   Collection Time: 08/13/15 12:37 PM  Result Value Ref Range Status   Specimen Description WOUND  Final   Special Requests Normal  Final   Gram Stain   Final    FEW WBC SEEN TOO NUMEROUS TO  COUNT GRAM NEGATIVE RODS FEW GRAM POSITIVE COCCI    Culture   Final    HEAVY GROWTH ESCHERICHIA COLI MODERATE GROWTH PROTEUS MIRABILIS LIGHT GROWTH KLEBSIELLA PNEUMONIAE MODERATE GROWTH ENTEROCOCCUS GALLINARUM CRITICAL RESULT CALLED TO, READ BACK BY AND VERIFIED WITH: [facility] BORBA AT 1042 08/16/15 DV    Report Status 08/17/2015 FINAL  Final   Organism ID, Bacteria ESCHERICHIA COLI  Final   Organism ID, Bacteria PROTEUS MIRABILIS  Final   Organism ID, Bacteria KLEBSIELLA PNEUMONIAE  Final   Organism ID, Bacteria ENTEROCOCCUS GALLINARUM  Final      Susceptibility   Escherichia coli - MIC*    AMPICILLIN >=32 RESISTANT Resistant     CEFTAZIDIME 4 RESISTANT Resistant     CEFAZOLIN >=64 RESISTANT Resistant     CEFTRIAXONE 16 RESISTANT Resistant     GENTAMICIN 2 SENSITIVE Sensitive     IMIPENEM >=16 RESISTANT Resistant     TRIMETH/SULFA <=20 SENSITIVE Sensitive     Extended ESBL POSITIVE Resistant     PIP/TAZO Value in next row Resistant      RESISTANT>=128    CIPROFLOXACIN Value in next row Sensitive      SENSITIVE<=0.25    * HEAVY GROWTH ESCHERICHIA COLI   Klebsiella pneumoniae - MIC*    AMPICILLIN Value in next row Resistant      SENSITIVE<=0.25    CEFTAZIDIME Value in next row Resistant      SENSITIVE<=0.25    CEFAZOLIN Value in next row Resistant      SENSITIVE<=0.25    CEFTRIAXONE Value in next row Resistant      SENSITIVE<=0.25    CIPROFLOXACIN Value in next row Resistant      SENSITIVE<=0.25    GENTAMICIN Value in next row Sensitive      SENSITIVE<=0.25    IMIPENEM Value in next row Resistant      SENSITIVE<=0.25    TRIMETH/SULFA Value in next row Resistant      SENSITIVE<=0.25    PIP/TAZO Value in next row Resistant      RESISTANT>=128    *  LIGHT GROWTH KLEBSIELLA PNEUMONIAE   Proteus mirabilis - MIC*    AMPICILLIN Value in next row Resistant      RESISTANT>=128    CEFTAZIDIME Value in next row Sensitive      RESISTANT>=128    CEFAZOLIN Value in next row Sensitive      RESISTANT>=128    CEFTRIAXONE Value in next row Sensitive      RESISTANT>=128    CIPROFLOXACIN Value in next row Sensitive      RESISTANT>=128    GENTAMICIN Value in next row Sensitive      RESISTANT>=128    IMIPENEM Value in next row Sensitive      RESISTANT>=128    TRIMETH/SULFA Value in next row Sensitive      RESISTANT>=128    PIP/TAZO Value in next row Sensitive      SENSITIVE<=4    * MODERATE GROWTH PROTEUS MIRABILIS   Enterococcus gallinarum - MIC*    AMPICILLIN Value in next row Resistant      SENSITIVE<=4    GENTAMICIN SYNERGY Value in next row Sensitive      SENSITIVE<=4    CIPROFLOXACIN Value in next row Resistant      RESISTANT>=8    TETRACYCLINE Value in next row Resistant      RESISTANT>=16    * MODERATE GROWTH ENTEROCOCCUS GALLINARUM  Wound culture     Status: None   Collection Time: 08/15/15  2:53 PM  Result Value Ref Range Status   Specimen Description WOUND  Final   Special Requests NONE  Final   Gram Stain FEW WBC SEEN NO ORGANISMS SEEN   Final   Culture NO GROWTH 3 DAYS  Final   Report Status 08/18/2015 FINAL  Final  C difficile quick scan w PCR reflex     Status: None   Collection Time: 08/17/15 11:34 AM  Result Value Ref Range Status   C Diff antigen NEGATIVE NEGATIVE Final   C Diff toxin NEGATIVE NEGATIVE Final   C Diff interpretation Negative for C. difficile  Final  Stool culture     Status: None   Collection Time: 09/03/15  3:51 PM  Result Value Ref Range Status   Specimen Description STOOL  Final   Special Requests Immunocompromised  Final   Culture   Final    NO SALMONELLA OR SHIGELLA ISOLATED No Pathogenic E. coli detected NO CAMPYLOBACTER DETECTED    Report Status 09/07/2015 FINAL  Final  C difficile  quick scan w PCR reflex     Status: None   Collection Time: 09/13/15 12:51 AM  Result Value Ref Range Status   C Diff antigen NEGATIVE NEGATIVE Final   C Diff toxin NEGATIVE NEGATIVE Final   C Diff interpretation Negative for C. difficile  Final  Culture, blood (routine x 2)     Status: None   Collection Time: 09/16/15 11:24 AM  Result Value Ref Range Status   Specimen Description BLOOD LEFT HAND  Final   Special Requests BOTTLES DRAWN AEROBIC AND ANAEROBIC  1CC  Final   Culture NO GROWTH 5 DAYS  Final   Report Status 09/21/2015 FINAL  Final  Culture, blood (routine x 2)     Status: None   Collection Time: 09/16/15 12:23 PM  Result Value Ref Range Status   Specimen Description BLOOD RIGHT HAND  Final   Special Requests BOTTLES DRAWN AEROBIC AND ANAEROBIC  1CC  Final   Culture  Setup Time   Final    GRAM POSITIVE COCCI AEROBIC BOTTLE ONLY CRITICAL RESULT CALLED TO, READ BACK BY AND VERIFIED WITH: TESS THOMAS,RN 09/17/2015 0631 BY JRS.    Culture   Final    ENTEROCOCCUS FAECALIS AEROBIC BOTTLE ONLY VRE HAVE INTRINSIC RESISTANCE TO MOST COMMONLY USED ANTIBIOTICS AND THE ABILITY TO ACQUIRE RESISTANCE TO MOST AVAILABLE ANTIBIOTICS. CRITICAL RESULT CALLED TO, READ BACK BY AND VERIFIED WITH: CHERYL SMITH AT [id number] 09/19/15 DV    Report Status 09/21/2015 FINAL  Final   Organism ID, Bacteria ENTEROCOCCUS FAECALIS  Final      Susceptibility   Enterococcus faecalis - MIC*    AMPICILLIN <=2 SENSITIVE Sensitive     LINEZOLID 2 SENSITIVE Sensitive     CIPROFLOXACIN Value in next row Resistant      RESISTANT>=8    TETRACYCLINE Value in next row Resistant      RESISTANT>=16    VANCOMYCIN Value in next row Resistant      RESISTANT>=32    GENTAMICIN SYNERGY Value in next row Resistant      RESISTANT>=32    * ENTEROCOCCUS FAECALIS  Culture, respiratory (NON-Expectorated)     Status: None   Collection Time: 09/16/15  3:50 PM  Result Value Ref Range Status   Specimen Description TRACHEAL  ASPIRATE  Final   Special Requests Immunocompromised  Final   Gram Stain   Final    FEW WBC SEEN GOOD SPECIMEN - 80-90% WBCS RARE GRAM NEGATIVE RODS    Culture  LIGHT GROWTH PSEUDOMONAS AERUGINOSA  Final   Report Status 09/19/2015 FINAL  Final   Organism ID, Bacteria PSEUDOMONAS AERUGINOSA  Final      Susceptibility   Pseudomonas aeruginosa - MIC*    CEFTAZIDIME 8 SENSITIVE Sensitive     CIPROFLOXACIN 2 INTERMEDIATE Intermediate     GENTAMICIN >=16 RESISTANT Resistant     IMIPENEM >=16 RESISTANT Resistant     PIP/TAZO Value in next row Sensitive      SENSITIVE32    CEFEPIME Value in next row Sensitive      SENSITIVE8    LEVOFLOXACIN Value in next row Resistant      RESISTANT>=8    * LIGHT GROWTH PSEUDOMONAS AERUGINOSA  Culture, expectorated sputum-assessment     Status: None   Collection Time: 09/22/15  2:09 PM  Result Value Ref Range Status   Specimen Description ENDOTRACHEAL  Final   Special Requests Normal  Final   Sputum evaluation THIS SPECIMEN IS ACCEPTABLE FOR SPUTUM CULTURE  Final   Report Status 09/24/2015 FINAL  Final  Culture, respiratory (NON-Expectorated)     Status: None   Collection Time: 09/22/15  2:09 PM  Result Value Ref Range Status   Specimen Description ENDOTRACHEAL  Final   Special Requests Normal Reflexed from [id number]  Final   Gram Stain   Final    FEW WBC SEEN FEW GRAM NEGATIVE RODS POOR SPECIMEN - LESS THAN 70% WBCS    Culture   Final    MODERATE GROWTH PSEUDOMONAS AERUGINOSA LIGHT GROWTH KLEBSIELLA PNEUMONIAE REFER TO SENSITIVITIES FROM PREVIOUS CULTURE FOR ORG 2    Report Status 10/01/2015 FINAL  Final   Organism ID, Bacteria PSEUDOMONAS AERUGINOSA  Final   Organism ID, Bacteria KLEBSIELLA PNEUMONIAE  Final      Susceptibility   Pseudomonas aeruginosa - MIC*    CEFTAZIDIME 4 SENSITIVE Sensitive     CIPROFLOXACIN 2 INTERMEDIATE Intermediate     GENTAMICIN >=16 RESISTANT Resistant     IMIPENEM >=16 RESISTANT Resistant     PIP/TAZO Value in  next row Sensitive      SENSITIVE16    * MODERATE GROWTH PSEUDOMONAS AERUGINOSA  Tissue culture     Status: None   Collection Time: 09/24/15  6:44 AM  Result Value Ref Range Status   Specimen Description BONE  Final   Special Requests Normal  Final   Gram Stain MODERATE WBC SEEN FEW GRAM NEGATIVE RODS   Final   Culture   Final    MODERATE GROWTH ESCHERICHIA COLI MODERATE GROWTH KLEBSIELLA PNEUMONIAE ESBL-EXTENDED SPECTRUM BETA LACTAMASE-THE ORGANISM IS RESISTANT TO PENICILLINS, CEPHALOSPORINS AND AZTREONAM ACCORDING TO CLSI M100-S15 VOL.25 N01 JAN 2005. ORGANISM 1 This organism isolate is resistant to one or more antiotic agents in three or more antimicrobial categories.  Suggest Infectious Disease consult.   ORGANISM 2 CRITICAL RESULT CALLED TO, READ BACK BY AND VERIFIED WITH: RN Mardene Celeste LINDSAY 09/27/15 1005AM    Report Status 09/28/2015 FINAL  Final   Organism ID, Bacteria ESCHERICHIA COLI  Final   Organism ID, Bacteria KLEBSIELLA PNEUMONIAE  Final      Susceptibility   Escherichia coli - MIC*    AMPICILLIN >=32 RESISTANT Resistant     CEFTAZIDIME 4 RESISTANT Resistant     CEFAZOLIN >=64 RESISTANT Resistant     CEFTRIAXONE 8 RESISTANT Resistant     GENTAMICIN <=1 SENSITIVE Sensitive     IMIPENEM 8 RESISTANT Resistant     TRIMETH/SULFA <=20 SENSITIVE Sensitive     Extended ESBL POSITIVE Resistant  PIP/TAZO Value in next row Resistant      RESISTANT>=128    * MODERATE GROWTH ESCHERICHIA COLI   Klebsiella pneumoniae - MIC*    AMPICILLIN Value in next row Resistant      RESISTANT>=128    CEFTAZIDIME Value in next row Resistant      RESISTANT>=128    CEFAZOLIN Value in next row Resistant      RESISTANT>=128    CEFTRIAXONE Value in next row Resistant      RESISTANT>=128    CIPROFLOXACIN Value in next row Resistant      RESISTANT>=128    GENTAMICIN Value in next row Resistant      RESISTANT>=128    IMIPENEM Value in next row Resistant      RESISTANT>=128    TRIMETH/SULFA  Value in next row Sensitive      RESISTANT>=128    PIP/TAZO Value in next row Resistant      RESISTANT>=128    * MODERATE GROWTH KLEBSIELLA PNEUMONIAE  Anaerobic culture     Status: None   Collection Time: 09/24/15  3:55 PM  Result Value Ref Range Status   Specimen Description BONE  Final   Special Requests Normal  Final   Culture NO ANAEROBES ISOLATED  Final   Report Status 09/29/2015 FINAL  Final  Culture, fungus without smear     Status: None   Collection Time: 09/24/15  3:55 PM  Result Value Ref Range Status   Specimen Description BONE  Final   Special Requests Normal  Final   Culture NO FUNGUS ISOLATED AFTER 24 DAYS  Final   Report Status 10/18/2015 FINAL  Final  C difficile quick scan w PCR reflex     Status: None   Collection Time: 10/07/15  2:02 PM  Result Value Ref Range Status   C Diff antigen NEGATIVE NEGATIVE Final   C Diff toxin NEGATIVE NEGATIVE Final   C Diff interpretation Negative for C. difficile  Final  Culture, blood (routine x 2)     Status: None   Collection Time: 10/16/15  9:52 AM  Result Value Ref Range Status   Specimen Description BLOOD LEFT HAND  Final   Special Requests   Final    BOTTLES DRAWN AEROBIC AND ANAEROBIC  AER 6CC ANA 4CC   Culture NO GROWTH 6 DAYS  Final   Report Status 10/22/2015 FINAL  Final  Culture, blood (routine x 2)     Status: None   Collection Time: 10/16/15 12:25 PM  Result Value Ref Range Status   Specimen Description BLOOD LEFT HAND  Final   Special Requests BOTTLES DRAWN AEROBIC AND ANAEROBIC  5CC  Final   Culture NO GROWTH 6 DAYS  Final   Report Status 10/22/2015 FINAL  Final  Culture, expectorated sputum-assessment     Status: None   Collection Time: 10/18/15  1:15 PM  Result Value Ref Range Status   Specimen Description SPUTUM  Final   Special Requests NONE  Final   Sputum evaluation THIS SPECIMEN IS ACCEPTABLE FOR SPUTUM CULTURE  Final   Report Status 10/18/2015 FINAL  Final  Culture, respiratory  (NON-Expectorated)     Status: None   Collection Time: 10/18/15  1:15 PM  Result Value Ref Range Status   Specimen Description SPUTUM  Final   Special Requests NONE Reflexed from [id number]  Final   Gram Stain   Final    GOOD SPECIMEN - 80-90% WBCS MODERATE WBC SEEN MANY GRAM NEGATIVE RODS    Culture  Final    HEAVY GROWTH PSEUDOMONAS AERUGINOSA MODERATE GROWTH KLEBSIELLA PNEUMONIAE CRITICAL RESULT CALLED TO, READ BACK BY AND VERIFIED WITHRod Mae AT 1610 10/21/15 DV KLEBSIELLA PNEUMONIAE This organism isolate is resistant to one or more antiotic agents in three or more antimicrobial categories.  Suggest Infectious Disease  consult.      Report Status 10/22/2015 FINAL  Final   Organism ID, Bacteria PSEUDOMONAS AERUGINOSA  Final   Organism ID, Bacteria KLEBSIELLA PNEUMONIAE  Final      Susceptibility   Klebsiella pneumoniae - MIC*    AMPICILLIN >=32 RESISTANT Resistant     CEFAZOLIN >=64 RESISTANT Resistant     CEFTRIAXONE >=64 RESISTANT Resistant     CIPROFLOXACIN >=4 RESISTANT Resistant     GENTAMICIN >=16 RESISTANT Resistant     IMIPENEM >=16 RESISTANT Resistant     NITROFURANTOIN >=512 RESISTANT Resistant     TRIMETH/SULFA <=20 SENSITIVE Sensitive     * MODERATE GROWTH KLEBSIELLA PNEUMONIAE   Pseudomonas aeruginosa - MIC*    CEFTAZIDIME 4 SENSITIVE Sensitive     CIPROFLOXACIN >=4 RESISTANT Resistant     GENTAMICIN 8 INTERMEDIATE Intermediate     IMIPENEM >=16 RESISTANT Resistant     PIP/TAZO Value in next row Sensitive      SENSITIVE32    * HEAVY GROWTH PSEUDOMONAS AERUGINOSA  C difficile quick scan w PCR reflex     Status: None   Collection Time: 10/18/15  4:39 PM  Result Value Ref Range Status   C Diff antigen NEGATIVE NEGATIVE Final   C Diff toxin NEGATIVE NEGATIVE Final   C Diff interpretation Negative for C. difficile  Final    Coagulation Studies: No results for input(s): LABPROT, INR in the last 72 hours.  Urinalysis: No results for input(s):  COLORURINE, LABSPEC, PHURINE, GLUCOSEU, HGBUR, BILIRUBINUR, KETONESUR, PROTEINUR, UROBILINOGEN, NITRITE, LEUKOCYTESUR in the last 72 hours.  Invalid input(s): APPERANCEUR    Imaging: No results found.   Medications:   . feeding supplement (JEVITY 1.5 CAL/FIBER) 1,000 mL (11/01/15 0520)  . norepinephrine Stopped (10/08/15 0530)   . amantadine  200 mg Per Tube Q7 days  . antiseptic oral rinse  7 mL Mouth Rinse QID  . aspirin  81 mg Oral Daily  . chlorhexidine gluconate  15 mL Mouth Rinse BID  . epoetin (EPOGEN/PROCRIT) injection  10,000 Units Intravenous Q M,W,F-HD  . escitalopram  10 mg Oral Daily  . feeding supplement (PRO-STAT SUGAR FREE 64)  30 mL Oral 6 X Daily  . folic acid  1 mg Oral Daily  . free water  20 mL Per Tube 6 times per day  . heparin subcutaneous  5,000 Units Subcutaneous Q12H  . hydrocortisone  10 mg Per Tube BID  . insulin aspart  0-9 Units Subcutaneous 6 times per day  . lidocaine  1 patch Transdermal Q24H  . loperamide  2 mg Oral BID  . midodrine  5 mg Per Tube TID WC  . multivitamin  5 mL Oral Daily  . pantoprazole sodium  40 mg Per Tube Daily  . saccharomyces boulardii  250 mg Oral BID  . sodium chloride  10-40 mL Intracatheter Q12H  . sulfamethoxazole-trimethoprim  2.5 tablet Per Tube q1800  . thiamine  100 mg Per Tube Daily   sodium chloride, sodium chloride, acetaminophen (TYLENOL) oral liquid 160 mg/5 mL, HYDROmorphone (DILAUDID) injection, ipratropium-albuterol, morphine injection, ondansetron (ZOFRAN) IV, oxyCODONE, sodium chloride, zinc oxide  Assessment/ Plan:  49 y.o. 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. Admitted on 07/24/2015  1. End-stage renal disease on hemodialysis on HD MWF. The patient has been on dialysis since October of 2014. R IJ permcath. Dialysis treatment with IV albumin for oncotic support. Midodrine and hydrocortisone ordered for blood pressure support.  - Pt completed HD yesterday, will reassess labs today to determine if she needs extra HD treament today given hyperkalemia.  2. Anemia of CKD. Continue epogen 10000 units IV with HD.   3. Secondary hyperparathyroidism: PTH 50, phos normal, continue to montior bone mineral metabolism parameters.   4. Sepsis: VRE in blood. Bone cultures E. Coli and klebsiella - Treatment as per ID and ICU team: daptomycin completed on 11/10 and now on high dose trimethoprim/sulfamethoxazole.   5. Acute resp failure:  -continue vent support at this time.    6. Generalized edema - had good fluid removal yesterday  7.  Hyperkalemia:  K high at 6 yesterday, was run against a 1k bath for a period of time, rechecking K today.    LOS: 81 Courtney [name] 12/13/20169:21 AM

## 2015-11-01 NOTE — Progress Notes (Signed)
[facility] Critical Care Medicine Progess Note  Name: Courtney [name] MRN: [id number] DOB: Jul 31, 1966    ADMISSION DATE:  07/26/2015   INITIAL PRESENTATION:  49 AAF who has been in medical facilities (hosp, LTAC, rehab) for 2 yrs following gastric bypass surgery with multiple complications. Now with chronic trach, ESRD, profound debilitation, severe sacral pressure ulcer. Was seemingly making progress and transferred to rehab facility approx one week prior to this admission. She was sent to [facility] ED with AMS and hypotension. Working dx of severe sepsis/septic shock due to infected sacral pressure ulcer. Since admission. Her course is been very complicated with numerous complications including septic shock and GI bleeding. Now with failure to wean from vent    INTERVAL HISTORY: alert, noted to have some mild tachypnea when placed on lower settings of pressure support. Placed back on the vent for the majority of the day and night. We will attempt pressure support again today,plan for HD today I have spoken to the husband reintroduced ID of a possible PEG tube placement in the near future, he states that she does have a complex GI anatomy and the obstacle in the past was finding a surgeon suitable to place an NG tube, at this time he again would like patient to be liberated from the vent so that she may move forward to taking oral intake.   difficulty weaning from vent, patient with HD yesterday due to elevated K  On PS mode-will attempt to wean as tolerated  Summary of MAJOR EVENTS/TEST RESULTS: Admission 02/07/14-05/07/14 Admission 07/21/14-09/06/14 Discharged to [facility]. Pt had palliative consult at that time, were asked to sign off by husband.  09/23 CT head: NAD 09/23 EEG: no epileptiform activity 09/23 PRBCs for Hgb 6.4 09/24 bedside debridement of sacral wound. Abscess drained 09/25 Off vasopressors. More alert. No distress. Worsening thrombocytopenia. Vanc DC'd 09/29  Dr.  Sampson Goon (I.D) excused from the case by patient's husband. 10/02 MRI -multiple infarcts 10/03 tracheal bleeding- transfused platelets 10/03 hospitalist service excused from the case by patient's husband 10/03 Echocardiogram ejection fraction was 55-60%, pulmonary systolic pressure was 39 mmHg 40/98 restart TF's at lower rate, attempt reg HD  10/12 Transferred to med-surg floor. Remains on PCCM service 10/14 SLP eval: pt unable to tolerate PMV adequately 10/17-will re-attempt PM valve-discussed with Speech therapist 10/18 passed swallow eval-start pureed thick foods no thin liquids-continue NG feeds 10/19 transferred to step down for sepsis/aspiration pneumonia 10/19 cxr shows RLL opacity 10/21 sacral decub debride at bedside by surgery 10/22 started back on vasopressors while on HD 10/27 CT with osteo, R hip fx, unable to identify tip of dubhoff tube - sent for fluoro study 10/28 Ortho consultation: I do not feel that she is a surgical candidate. Therefore, I feel that it would best to manage this fracture nonsurgically and allow it to heal by itself over time, which it should.  10/28 Gen Surg consultation: Due to the lack of free air or free flow of contrast and the peritoneum there is no indication for any surgical intervention on this. Would recommend pulling back the feeding tube 1-2 cm. Would recheck an abdominal film to confirm no pneumoperitoneum in the morning. Absence of any changes okay to continue using Dobbhoff for feeding and medications. 10/28 gastrograffin study: The study confirms that the feeding tube pes perforated through the duodenum and the tip is within a cavity that fills with injected contrast. The cavity does appear walled off 11/4 refuses oral feeds, continue TF's CT reviewed: T8-T9 discitis/osteomyelitis,  RT HIP FRACTURE 11/5 placed back on vasopressors, placed back on Vent due to resp acidosis, levophed turned off 11/6 afternoon 11/5 PM Trach changed out due to cuff  leak, #6 Shiley cuffed, 11/6 dubhoff occluded, removed and replaced, new dubhoff shows tube in the antral stomach 11/15 refusing to take oral feeds 11/18 placed back on Vent for increased WOB,SOB. 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 10/18/2015; the patient developed a new left basal pneumonia; with recurrent sepsis.  10/19/2015; fevers resolved with IV acetaminophen. Tube feeds changed from vital to Jevity.  12/7 CXR with stable b/l opacities.  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 Pneumoniae, P. Mirabilis, VRE Wound 9/26 >> No growth Blood 9/23 >> NEG CDiff 9/27>>neg Stool Cx 10/15>> negative Cdiff 10/25>>neg Trach Aspirate 10/28>> light growth pseudomonas Blood 10/28 >> 1/2 GPC >>  Sputum cultures obtained 09/22/15 due to mucus plugging>>Pseudomonas BONE TISSUE Cx 11/3>>E.coli (ESBL), k. Pneumonia (daptomycin and septra) Bld Cx 11/27>>negative thus far.  Trach Asp 11/27>> grew out Pseudomonas, and Klebsiella pneumonia.  ANTIMICROBIALS:  Aztreonam 9/23 >> 9/24 Vanc 9/23 >> 9/25 Vanc 9/26>>9/27 Daptomycin 9/27>> 10/12, 10/28>> off Meropenem 9/23 >> 10/14, 10/28 >> 10/31 Septra 11/8>> Zosyn 11/27,>> 11/30. Levaquin 11/29>> 11/29. Ceftaz 12/1>>  VITAL SIGNS: Temp:  [98.2 F (36.8 C)-99.5 F (37.5 C)] 98.6 F (37 C) (12/13 0800) Pulse Rate:  [90-105] 93 (12/13 0600) Resp:  [0-35] 24 (12/13 0600) BP: (108-131)/(47-97) 129/70 mmHg (12/13 0600) SpO2:  [96 %-100 %] 98 % (12/13 0600) FiO2  (%):  [30 %] 30 % (12/13 0800) HEMODYNAMICS:   VENTILATOR SETTINGS: Vent Mode:  [-] PSV FiO2 (%):  [30 %] 30 % Set Rate:  [14 bmp] 14 bmp Vt Set:  [530 mL] 530 mL PEEP:  [5 cmH20] 5 cmH20 Pressure Support:  [20 cmH20-25 cmH20] 20 cmH20 INTAKE / OUTPUT:  Intake/Output Summary (Last 24 hours) at 11/01/15 0924 Last data filed at 11/01/15 0800  Gross per 24 hour  Intake   1330 ml  Output   1900 ml  Net   -570 ml   PHYSICAL EXAMINATION: Physical Examination:   VS: BP 129/70 mmHg  Pulse 93  Temp(Src) 98.6 F (37 C) (Axillary)  Resp 24  Ht  (1.753 m)  Wt 229 lb 4.5 oz (104 kg)  BMI 33.84 kg/m2  SpO2 98%  LMP  (LMP Unknown)  General Appearance: No resp distress, coarse upper airway sounds, on vent Neuro: profoundly diffusely weak, alert today, sleeping but easily arousable HEENT: cushingoid facies, PERRLA, EOM intact Neck: trach site clean Pulmonary: clear anteriorly Cardiovascular: reg, + syst murmur Abdomen: soft, NT, +BS Extremities: warm, R>L UE edema +sacral wound    LABORATORY PANEL:   CBC  Recent Labs Lab 10/31/15 0448  WBC 9.4  HGB 7.7*  HCT 24.4*  PLT 257    Chemistries   Recent Labs Lab 10/31/15 0448  NA 144  K 6.0*  CL 101  CO2 34*  GLUCOSE 109*  BUN 92*  CREATININE 1.43*  CALCIUM 8.7*  PHOS 5.2*  IMPRESSION/PLAN (current major issues):  Recurrent sepsis, due to pneumonia.   Anemia with macrocytosis -Uncertain if this is due to chronic slow GI bleed or simple critical care associated anemia. She has already receiving folate, thiamine, and multivitamin. Her anemia has not responded to oral medications, this may be due to her GI malabsorption.   Pneumonia with sepsis-remains on vent support -Chest x-ray shows new left lower lobe infiltrate,which is now improving.  Sputum cultures show pseudomonas. The patient is once again, vent dependent. She has developed again recurrent sepsis.Marland Kitchen. Sputum cultures showing Pseudomonas,  and Klebsiella, currently covered with this. Ceftaz total 10 days, stop date 12/11 Weaning trial daily off MV - currently on PSV trials with plan to transition back to TCT  Worsening sacral decubitus ulcer, stage IV. With spin involvement -The patient is already had a sacral bone scraping, and has been placed on Septra for antibiotic, per ID recommendations. Her sacral decubitus does not appear to be improving and in fact appears to be worsening. In addition, she has several new smaller areas of tissue breakdown.  End-stage renal disease. -Continue on hemodialysis per nephrology.  Diarrhea. -loperamide 2 twice daily scheduled, and changing her tube feeds back to Jevity.   Trach Care -changed on 11/5 to #6 Shiley due to cuff leak   Patient appears to be stable for transfer to an LTAC facility at this point in time.   Additional Chronic issues and complicating factors during this admission.  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  Thrombocytopenia, resolved - HIT panel negative 9/25 Recurrent Severe sepsis, due to sacral pressure ulcer DM 2, controlled.  Chronic steroid therapy, for a history of of adrenal insufficiency Incidental finding of R hip fx - conservative mgmt. Waxing and waning encephalopathy - she was evaluated by both the psychiatry and neurology services, and it was deemed that the patient lacks decisional capacity at this time. 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 Husband declined TEE Profound deconditioning-criticall illness neuropathy Chronic pain    The Patient requires high complexity decision making for assessment and support, frequent evaluation and titration of therapies, application of advanced monitoring technologies and extensive interpretation of multiple databases. Critical Care Time devoted to patient care services described in this note is 35 minutes.   Overall, patient is critically  ill, prognosis is guarded.  Patient with Multiorgan failure and at high risk for cardiac arrest and death.    Lucie LeatherKurian David Harris Penton, M.D.  [facility]  Medical Director [facility] Medical Director [facility] Cardio[facility] Department

## 2015-11-01 NOTE — Progress Notes (Signed)
Physical Therapy Treatment Patient Details Name: Courtney [name] MRN: [id number] DOB: 12/29/1965 Today's Date: 11/01/2015    History of Present Illness Pt is a [age] F who has been in medical facilities (hosp, LTAC, rehab) for 2 yrs following gastric bypass surgery with multiple complications. Now with chronic trach, ESRD, profound debilitation, severe sacral pressure ulcer (stage IV). Was seemingly making progress and transferred to rehab facility approx one week prior to this admission. [facility] with AMS and hypotension. Admitted with sepsis related to infected sacral ulcer.  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  Patient transferred to CCU to med-surg floor during hospitalization, but experienced profound interdialytic hypotension requiring return transfer to CCU for use of pressors (now off); remains in CCU.  Noted with R hip intertrochanteric fracture, recommended for conservative management per orthopedics.  Hospital course continues to be complicated with recurrent sepsis requiring patient on/off vent multiple times; currently on vent support with PEEP 5.  Limited ability/willingness to follow commands or actively participate in therapeutic interventions.  Per care team, physicians recommending hospice care/comfort measures at this time; husband considering goals of care/POC pending discussion with patient and other family members, but wishes continued medical care at this time. Will carefully monitor medical plan and POC to evaluate appropriateness of continued PT throughout remaining hospitalization.    PT Comments    Patient with minimal/no active effort and participation with session this date, physically resisting therapist as able at times.  Intermittently shaking head "no', displaying facial grimacing with  attempts at  L LE ROM.    Follow Up Recommendations  LTACH;SNF     Equipment Recommendations       Recommendations for Other Services       Precautions / Restrictions Precautions Precautions: Fall Precaution Comments: stage 4 sacral ulcer, NG tube/NPO, trach, contact isolation, R IJ permcath, L IJ central line, log roll only (as needed for dressing changes/hygiene) and no ROM to R LE/no attempts at sitting Restrictions Weight Bearing Restrictions: Yes RLE Weight Bearing: Non weight bearing Other Position/Activity Restrictions: No ROM R LE    Mobility  Bed Mobility               General bed mobility comments: Deferred/contraindicated  Transfers                 General transfer comment: Deferred/contraindicated  Ambulation/Gait                 Stairs            Wheelchair Mobility    Modified Rankin (Stroke Patients Only)       Balance                                    Cognition Arousal/Alertness: Awake/alert Behavior During Therapy: Impulsive Overall Cognitive Status: Difficult to assess                      Exercises Other Exercises Other Exercises: Passive ROM to bilat UEs, 1x10: gross grasp/release, wrist flex/ext, forearm pronation/supination, elbow flex/ext, shoulder IR/ER.  Patient refusing/physically resisting attempts at L shoulder ROM this date; shaking head "no" to L UE.  No attempts at act effort/assist noted this date (despite spontaneous movement of L UE to manage covers during  session). Other Exercises: L LE passive ROM, 1x10: ankle PF/DF, hip/knee flex/ext, hip abduct/adduct/IR/ER.  Facial grimacing with movement of L hip/knee.  significant pitting edema noted throughout L LE.    General Comments        Pertinent Vitals/Pain Pain Assessment: Faces Faces Pain Scale: Hurts even more Pain Location: facial grimacing, esp with ROM to L LE Pain Descriptors / Indicators: Grimacing Pain  Intervention(s): Limited activity within patient's tolerance;Monitored during session;Repositioned    Home Living                      Prior Function            PT Goals (current goals can now be found in the care plan section) Acute Rehab PT Goals Patient Stated Goal: patient unable to verbalize/state goal PT Goal Formulation: Patient unable to participate in goal setting Time For Goal Achievement: 11/03/15 Potential to Achieve Goals: Poor Progress towards PT goals: Not progressing toward goals - comment (no attempts at active effort/participation; limited by medical co-morbidities)    Frequency  Min 2X/week    PT Plan Current plan remains appropriate    Co-evaluation             End of Session   Activity Tolerance: Patient limited by pain Patient left: in bed     Time: 1610-9604 PT Time Calculation (min) (ACUTE ONLY): 15 min  Charges:  $Therapeutic Exercise: 8-22 mins                    G Codes:      Courtney [name], PT, DPT, NCS 11/01/2015, 11:45 AM [phone]

## 2015-11-02 DIAGNOSIS — F4321 Adjustment disorder with depressed mood: Secondary | ICD-10-CM | POA: Insufficient documentation

## 2015-11-02 LAB — GLUCOSE, CAPILLARY
GLUCOSE-CAPILLARY: 113 mg/dL — AB (ref 65–99)
GLUCOSE-CAPILLARY: 116 mg/dL — AB (ref 65–99)
GLUCOSE-CAPILLARY: 151 mg/dL — AB (ref 65–99)
Glucose-Capillary: 130 mg/dL — ABNORMAL HIGH (ref 65–99)
Glucose-Capillary: 117 mg/dL — ABNORMAL HIGH (ref 65–99)
Glucose-Capillary: 127 mg/dL — ABNORMAL HIGH (ref 65–99)

## 2015-11-02 MED ORDER — ALBUMIN HUMAN 25 % IV SOLN
25.0000 g | Freq: Once | INTRAVENOUS | Status: AC
  Administered 2015-11-02: 25 g via INTRAVENOUS
  Filled 2015-11-02: qty 100

## 2015-11-02 NOTE — Progress Notes (Signed)
Pre-hd tx 

## 2015-11-02 NOTE — Consult Note (Signed)
[facility] Face-to-Face Psychiatry Consult   Reason for Consult:  Consult for this 49 year old woman with multiple severe medical problems currently in the critical care unit. 2 requests, one about capacity to make medical decisions and also about depression. Referring Physician: Kasa Patient Identification: Courtney [name] MRN:  [id number] Principal Diagnosis: Adjustment disorder Diagnosis:   Patient Active Problem List   Diagnosis Date Noted  . Aspiration pneumonia ([place]) [J69.0]   . Chronic respiratory failure ([place]) [J96.10]   . Dyspnea [R06.00]   . Respiratory failure ([place]) [J96.90]   . Unresponsive [R40.4]   . Cerebrovascular accident (CVA) ([place]) [I63.9]   . Protein-calorie malnutrition, severe ([place]) [E43] 08/26/2015  . Gastrointestinal bleeding [K92.2]   . Decubitus ulcer [L89.90]   . Septic shock ([place]) [A41.9, R65.21] 07/24/2015  . Pressure ulcer [L89.90] 08/14/2015  . Acute on chronic respiratory failure ([place]) [J96.20] 06/27/2015  . 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 Paroxysmal ventricular tachycardia-non sustained  [I47.2] 01/28/2012  . CAD (coronary artery disease) prior LAD BMS [I25.10] 01/28/2012  . Renal insufficiency [N28.9] 01/28/2012  . Chronic diastolic heart failure ([place]) [I50.32] 01/28/2012    Total Time spent with patient: 45 minutes  Subjective:   Courtney [name] is a 49 y.o. female patient admitted with the patient was unable to speak and therefore unable to give any real subjective information.Marland Kitchen  HPI:  49 year old woman who has been in the hospital for a very lengthy stay with multiple medical problems including pneumonia respiratory failure end-stage renal failure sepsis. Currently still in the critical care unit and on a ventilator through a tracheostomy. Attempted to communicate with the patient. Found the patient to be awake but only intermittently responsive. When I asked her to open her eyes I finally did get her to open her eyes for a  minute or so. She answered several questions with nods or shakes of her head. I am not entirely certain how accurate her responses were. She did attempt to do some communicating through speaking but I was unable to read her lips accurately. Patient appears to be unable or unwilling to use her hands to assist with communication. Therefore, the information I have is extremely limited. From what I could gather, the patient feels that she has not been given enough information about her medical condition. She would like to get more information about her medical condition. She believes that she has preferences about what should be done for her medical condition that she would like to be able to express. She does not feel currently like she understands everything that is going on with her medically. The patient does indicate that she has a feeling of being depressed. She seemed to indicate at least some feeling of at times wanting to give up. Was not able to give a clear answer to any more definitive questions about suicidality. Interestingly, when I suggested the possibility of treating her for depression she shook her head very vigorously and seemed to be very negative about it but I was not able to determine why she had that response.  Social history: From what I can tell patient is married. Family of been actively involved in her treatment.  Medical history: Extensive and not worth going over entirely in this context but obviously the patient is still critically ill and ventilator dependent.  Substance abuse history: No information available at on seeing anything in the chart to suggest that severed been an issue  Past Psychiatric History: I asked the patient if she had  ever been treated for depression in the past and she nodded her head yes. I was of course unable to get any more detail about it. I looked through the old chart and could not find anything to indicate that depression had been a problem in the  past or find anything on the problem list concerning it or any past medicine.  Risk to Self: Is patient at risk for suicide?: No Risk to Others:   Prior Inpatient Therapy:   Prior Outpatient Therapy:    Past Medical History:  Past Medical History  Diagnosis Date  . Obesity   . Dyslipidemia   . Hypertension   . Coronary artery disease     s/p BMS 2010 LAD  . Dysrhythmia     ventricular tachycardia resolved after LAD stent and beta blocker  . Diabetes mellitus     with retinopathy, neuropathy and microalbuminemia  . ESRD (end stage renal disease) on dialysis     Past Surgical History  Procedure Laterality Date  . Cholecystectomy  1990  . Pci lad  12/2010  . Colonoscopy  08/2011  . Left heart catheterization with coronary angiogram N/A 01/28/2012    Procedure: LEFT HEART CATHETERIZATION WITH CORONARY ANGIOGRAM;  Surgeon: Sinclair Grooms, MD;  Location: [facility] CATH LAB;  Service: Cardiovascular;  Laterality: N/A;  . Tracheostomy    . Gastric bypass     Family History:  Family History  Problem Relation Age of Onset  . Hypertension Father   . Diabetes Father   . Cancer Mother   . Diabetes Paternal Grandfather   . Hypertension Paternal Grandfather   . Diabetes Paternal Grandmother   . Hypertension Paternal Grandmother   . Cancer Maternal Grandmother     colon ca   Family Psychiatric  History: Unable to obtain any history about this and the circumstances Social History:  History  Alcohol Use No    Comment: occassional     History  Drug Use No    Social History   Social History  . Marital Status: Married    Spouse Name: N/A  . Number of Children: N/A  . Years of Education: N/A   Social History Main Topics  . Smoking status: Never Smoker   . Smokeless tobacco: Never Used  . Alcohol Use: No     Comment: occassional  . Drug Use: No  . Sexual Activity: Yes   Other Topics Concern  . None   Social History Narrative   Additional Social History:                           Allergies:   Allergies  Allergen Reactions  . Contrast Media [Iodinated Diagnostic Agents] Anaphylaxis  . Ampicillin Rash    Labs:  Results for orders placed or performed during the hospital encounter of 08/03/2015 (from the past 48 hour(s))  Glucose, capillary     Status: Abnormal   Collection Time: 10/31/15  7:51 PM  Result Value Ref Range   Glucose-Capillary 106 (H) 65 - 99 mg/dL  Glucose, capillary     Status: Abnormal   Collection Time: 10/31/15 11:54 PM  Result Value Ref Range   Glucose-Capillary 118 (H) 65 - 99 mg/dL  Glucose, capillary     Status: Abnormal   Collection Time: 11/01/15  4:39 AM  Result Value Ref Range   Glucose-Capillary 128 (H) 65 - 99 mg/dL  Glucose, capillary     Status: None   Collection  Time: 11/01/15  7:25 AM  Result Value Ref Range   Glucose-Capillary 89 65 - 99 mg/dL  Basic metabolic panel     Status: Abnormal   Collection Time: 11/01/15 11:09 AM  Result Value Ref Range   Sodium 147 (H) 135 - 145 mmol/L   Potassium 5.2 (H) 3.5 - 5.1 mmol/L   Chloride 102 101 - 111 mmol/L   CO2 35 (H) 22 - 32 mmol/L   Glucose, Bld 126 (H) 65 - 99 mg/dL   BUN 60 (H) 6 - 20 mg/dL   Creatinine, Ser 1.11 (H) 0.44 - 1.00 mg/dL   Calcium 8.8 (L) 8.9 - 10.3 mg/dL   GFR calc non Af Amer 57 (L) >60 mL/min   GFR calc Af Amer >60 >60 mL/min    Comment: (NOTE) The eGFR has been calculated using the CKD EPI equation. This calculation has not been validated in all clinical situations. eGFR's persistently <60 mL/min signify possible Chronic Kidney Disease.    Anion gap 10 5 - 15  CBC     Status: Abnormal   Collection Time: 11/01/15 11:09 AM  Result Value Ref Range   WBC 11.9 (H) 3.6 - 11.0 K/uL   RBC 2.71 (L) 3.80 - 5.20 MIL/uL   Hemoglobin 8.0 (L) 12.0 - 16.0 g/dL   HCT 26.4 (L) 35.0 - 47.0 %   MCV 97.4 80.0 - 100.0 fL   MCH 29.6 26.0 - 34.0 pg   MCHC 30.4 (L) 32.0 - 36.0 g/dL   RDW 18.8 (H) 11.5 - 14.5 %   Platelets 250 150 - 440 K/uL   Phosphorus     Status: None   Collection Time: 11/01/15 11:09 AM  Result Value Ref Range   Phosphorus 4.3 2.5 - 4.6 mg/dL  Magnesium     Status: None   Collection Time: 11/01/15 11:09 AM  Result Value Ref Range   Magnesium 2.1 1.7 - 2.4 mg/dL  Glucose, capillary     Status: None   Collection Time: 11/01/15 11:41 AM  Result Value Ref Range   Glucose-Capillary 97 65 - 99 mg/dL  Glucose, capillary     Status: Abnormal   Collection Time: 11/01/15  4:03 PM  Result Value Ref Range   Glucose-Capillary 127 (H) 65 - 99 mg/dL  Glucose, capillary     Status: Abnormal   Collection Time: 11/01/15  7:48 PM  Result Value Ref Range   Glucose-Capillary 107 (H) 65 - 99 mg/dL  Glucose, capillary     Status: Abnormal   Collection Time: 11/01/15 11:56 PM  Result Value Ref Range   Glucose-Capillary 118 (H) 65 - 99 mg/dL  Glucose, capillary     Status: Abnormal   Collection Time: 11/02/15  4:14 AM  Result Value Ref Range   Glucose-Capillary 130 (H) 65 - 99 mg/dL  Glucose, capillary     Status: Abnormal   Collection Time: 11/02/15  7:49 AM  Result Value Ref Range   Glucose-Capillary 117 (H) 65 - 99 mg/dL  Glucose, capillary     Status: Abnormal   Collection Time: 11/02/15 12:00 PM  Result Value Ref Range   Glucose-Capillary 113 (H) 65 - 99 mg/dL  Glucose, capillary     Status: Abnormal   Collection Time: 11/02/15  4:02 PM  Result Value Ref Range   Glucose-Capillary 116 (H) 65 - 99 mg/dL    Current Facility-Administered Medications  Medication Dose Route Frequency Provider Last Rate Last Dose  . 0.9 %  sodium chloride infusion  100 mL Intravenous PRN Munsoor Lateef, MD      . 0.9 %  sodium chloride infusion  100 mL Intravenous PRN Munsoor Lateef, MD      . acetaminophen (TYLENOL) solution 650 mg  650 mg Oral Q6H PRN Lance Coon, MD   650 mg at 10/30/15 2050  . amantadine (SYMMETREL) solution 200 mg  200 mg Per Tube Q7 days Wilhelmina Mcardle, MD   200 mg at 10/31/15 0000  . antiseptic oral  rinse solution (CORINZ)  7 mL Mouth Rinse QID Laverle Hobby, MD   7 mL at 11/02/15 1313  . aspirin chewable tablet 81 mg  81 mg Oral Daily Flora Lipps, MD   81 mg at 11/02/15 1311  . chlorhexidine gluconate (PERIDEX) 0.12 % solution 15 mL  15 mL Mouth Rinse BID Laverle Hobby, MD   15 mL at 11/02/15 0420  . epoetin alfa (EPOGEN,PROCRIT) injection 10,000 Units  10,000 Units Intravenous Q M,W,F-HD Laverle Hobby, MD   10,000 Units at 11/02/15 1044  . escitalopram (LEXAPRO) tablet 10 mg  10 mg Oral Daily Wilhelmina Mcardle, MD   10 mg at 11/02/15 1311  . feeding supplement (JEVITY 1.5 CAL/FIBER) liquid 1,000 mL  1,000 mL Per Tube Continuous Laverle Hobby, MD 50 mL/hr at 11/01/15 0520 1,000 mL at 11/01/15 0520  . feeding supplement (PRO-STAT SUGAR FREE 64) liquid 30 mL  30 mL Oral 6 X Daily Wilhelmina Mcardle, MD   30 mL at 11/02/15 1311  . folic acid (FOLVITE) tablet 1 mg  1 mg Oral Daily Laverle Hobby, MD   1 mg at 11/02/15 1311  . free water 20 mL  20 mL Per Tube 6 times per day Laverle Hobby, MD   20 mL at 11/02/15 1600  . heparin injection 5,000 Units  5,000 Units Subcutaneous Q12H Wilhelmina Mcardle, MD   5,000 Units at 11/02/15 1310  . hydrocortisone (CORTEF) tablet 10 mg  10 mg Per Tube BID Wilhelmina Mcardle, MD   10 mg at 11/02/15 1312  . HYDROmorphone (DILAUDID) injection 0.5 mg  0.5 mg Intravenous BID PRN Lance Coon, MD   0.5 mg at 11/02/15 1527  . insulin aspart (novoLOG) injection 0-9 Units  0-9 Units Subcutaneous 6 times per day Laverle Hobby, MD   1 Units at 11/02/15 0420  . ipratropium-albuterol (DUONEB) 0.5-2.5 (3) MG/3ML nebulizer solution 3 mL  3 mL Nebulization Q4H PRN Laverle Hobby, MD      . lidocaine (LIDODERM) 5 % 1 patch  1 patch Transdermal Q24H Anders Simmonds, MD   1 patch at 11/01/15 2101  . loperamide (IMODIUM) capsule 2 mg  2 mg Oral BID Flora Lipps, MD   2 mg at 11/02/15 1311  . midodrine (PROAMATINE) tablet 5 mg  5 mg Per Tube  TID WC Wilhelmina Mcardle, MD   5 mg at 11/02/15 1311  . morphine 4 MG/ML injection 4 mg  4 mg Intravenous Q4H PRN Vilinda Boehringer, MD   4 mg at 11/02/15 0814  . multivitamin liquid 5 mL  5 mL Oral Daily Laverle Hobby, MD   5 mL at 11/02/15 1312  . norepinephrine (LEVOPHED) 23m in D5W 2562mpremix infusion  0-40 mcg/min Intravenous Titrated PrLaverle HobbyMD   Stopped at 10/08/15 0530  . ondansetron (ZOFRAN) injection 4 mg  4 mg Intravenous Q6H PRN DaWilhelmina McardleMD   4 mg at 10/19/15 2121  . oxyCODONE (ROXICODONE) 5 MG/5ML solution 5 mg  5 mg  Per Tube Q6H PRN Wilhelmina Mcardle, MD   5 mg at 10/31/15 2109  . pantoprazole sodium (PROTONIX) 40 mg/20 mL oral suspension 40 mg  40 mg Per Tube Daily Vishal Mungal, MD   40 mg at 11/02/15 1310  . saccharomyces boulardii (FLORASTOR) capsule 250 mg  250 mg Oral BID Laverle Hobby, MD   250 mg at 11/02/15 1311  . sodium chloride 0.9 % injection 10-40 mL  10-40 mL Intracatheter Q12H Wilhelmina Mcardle, MD   10 mL at 11/02/15 1000  . sodium chloride 0.9 % injection 10-40 mL  10-40 mL Intracatheter PRN Wilhelmina Mcardle, MD   10 mL at 11/01/15 1241  . sodium hypochlorite (DAKIN'S 1/4 STRENGTH) topical solution   Irrigation BID Flora Lipps, MD      . sulfamethoxazole-trimethoprim (BACTRIM DS,SEPTRA DS) 800-160 MG per tablet 2.5 tablet  2.5 tablet Per Tube [id number] Flora Lipps, MD   2.5 tablet at 11/01/15 1800  . thiamine (VITAMIN B-1) tablet 100 mg  100 mg Per Tube Daily Laverle Hobby, MD   100 mg at 11/02/15 1312  . zinc oxide 20 % ointment   Topical PRN Flora Lipps, MD        Musculoskeletal: Strength & Muscle Tone: decreased Gait & Station: unable to stand Patient leans: N/A  Psychiatric Specialty Exam: Review of Systems  Unable to perform ROS: medical condition    Blood pressure 116/36, pulse 95, temperature 97.7 F (36.5 C), temperature source Axillary, resp. rate 24, height _0  (1.753 m), weight 104 kg (229 lb 4.5 oz), SpO2 100  %.Body mass index is 33.84 kg/(m^2).  General Appearance: Fairly Groomed  Engineer, water::  Minimal  Speech:  Nonexistent  Volume:  Negative  Mood:  Impossible to really express  Affect:  Seems dysphoric unhappy and at times to be in pain  Thought Process:  Impossible to tell  Orientation:  Other:  Impossible to know  Thought Content:  Can't determine  Suicidal Thoughts:  Possible although without further detail it's hard to say yes or no  Homicidal Thoughts:  Presumably no  Memory:  Not able to test  Judgement:  Other:  Can't really evaluate  Insight:  Unknown  Psychomotor Activity:  Negative  Concentration:  Poor  Recall:  Unknown  Fund of Knowledge:Unknown  Language: Unknown  Akathisia:  No  Handed:  Right  AIMS (if indicated):     Assets:  Desire for Improvement Social Support  ADL's:  Impaired  Cognition: Impaired,  Moderate  Sleep:      Treatment Plan Summary: Plan As to the first question, whether the patient has capacity, it is really impossible to, with the definitive general answer. I can say that she did appear to understand questions about knowing what is going on with her healthcare but even then I can't be entirely certain. Determining capacity requires ascertaining whether the patient can express an understanding of their condition, the pros and cons of something being offered to them and show inability to make a choice based on that. Without more communication I can't be certain of that in the abstract. If there is a specific decision that is being considered it would be easier to try and determine capacity because we can focus on that one single issue although even then communicating with only nods and shakes of the head makes it very limited. For now it is probably best to assume that the patient has capacity and to try to give her as much information  as possible and explain whatever is happening to her. If the situation arises in which she appears to be refusing  something that is thought to be medically important we can at that point reevaluate her ability to understand her decision. As to depression it is also very difficult to be sure. Given her degree of illness she will of course be unhappy. Given her low energy it's hard to evaluate whether to call it depression or not. She did indicate that she had some thoughts about giving up but that is not necessarily diagnostic. If the patient were agreeable, I would probably suggest at least very gently trying to add antidepressant medicine that would be safe under the circumstances, but she definitely shook her head no. Therefore I will put that aside and reevaluate her later. Will continue to follow  Disposition: Patient does not meet criteria for psychiatric inpatient admission. Supportive therapy provided about ongoing stressors.  John Clapacs 11/02/2015 6:08 PM Kasa

## 2015-11-02 NOTE — Progress Notes (Signed)
Subjective:  Pt seen during HD. Tolerating well thus far.   UF target 1kg.   Objective:  Vital signs in last 24 hours:  Temp:  [98.5 F (36.9 C)-99.6 F (37.6 C)] 98.5 F (36.9 C) (12/14 0812) Pulse Rate:  [89-105] 91 (12/14 0900) Resp:  [0-40] 6 (12/14 0900) BP: (110-135)/(38-119) 124/82 mmHg (12/14 0900) SpO2:  [94 %-100 %] 98 % (12/14 0900) FiO2 (%):  [30 %] 30 % (12/14 0848)  Weight change:  Filed Weights   10/29/15 0500  Weight: 104 kg (229 lb 4.5 oz)    Intake/Output: I/O last 3 completed shifts: In: 2110 [I.V.:30; NG/GT:2080] Out: -    Intake/Output this shift:  Total I/O In: 20 [NG/GT:20] Out: -   Physical Exam: General: critically ill appearing  Head/ENT:  OM moist, NG tube  Eyes: anicteric  Neck: Tracheostomy in place  Lungs:  Scattered rhonchi, vent assisted  fio2 30%  Heart: S1S2 no rubs  Abdomen:  Soft, nontender  Extremities: 2+ dependent edema, anasarca  Neurologic: Resting comfortably at the moment  Access:  R IJ permcath.       Basic Metabolic Panel:  Recent Labs Lab 10/26/15 1116 10/28/15 1050 10/30/15 1213 10/31/15 0448 11/01/15 1109  NA 139 140  --  144 147*  K 5.6* 5.8* 5.6* 6.0* 5.2*  CL 97* 97*  --  101 102  CO2 33* 33*  --  34* 35*  GLUCOSE 104* 152*  --  109* 126*  BUN 83* 72*  --  92* 60*  CREATININE 1.29* 1.25*  --  1.43* 1.11*  CALCIUM 8.8* 8.7*  --  8.7* 8.8*  MG  --   --   --   --  2.1  PHOS 4.5 4.7*  --  5.2* 4.3    Liver Function Tests:  Recent Labs Lab 10/26/15 1116 10/28/15 1050 10/31/15 0448  ALBUMIN 2.2* 2.1* 2.0*   No results for input(s): LIPASE, AMYLASE in the last 168 hours. No results for input(s): AMMONIA in the last 168 hours.  CBC:  Recent Labs Lab 10/26/15 1116 10/28/15 1050 10/30/15 0500 10/31/15 0448 11/01/15 1109  WBC 9.0 8.2 8.1 9.4 11.9*  HGB 8.0* 7.9* 7.7* 7.7* 8.0*  HCT 25.8* 25.1* 24.6* 24.4* 26.4*  MCV 96.1 96.0 97.4 95.9 97.4  PLT 215 248 249 257 250    Cardiac  Enzymes: No results for input(s): CKTOTAL, CKMB, CKMBINDEX, TROPONINI in the last 168 hours.  BNP: Invalid input(s): POCBNP  CBG:  Recent Labs Lab 11/01/15 1603 11/01/15 1948 11/01/15 2356 11/02/15 0414 11/02/15 0749  GLUCAP 127* 107* 118* 130* 117*    Microbiology: Results for orders placed or performed during the hospital encounter of 08-13-2015  Blood Culture (routine x 2)     Status: None   Collection Time: 2015-08-13  8:51 AM  Result Value Ref Range Status   Specimen Description BLOOD [name]  Final   Special Requests BOTTLES DRAWN AEROBIC AND ANAEROBIC  3CC  Final   Culture NO GROWTH 5 DAYS  Final   Report Status 08/17/2015 FINAL  Final  Blood Culture (routine x 2)     Status: None   Collection Time: 13-Aug-2015  9:20 AM  Result Value Ref Range Status   Specimen Description BLOOD LEFT ARM  Final   Special Requests BOTTLES DRAWN AEROBIC AND ANAEROBIC  1CC  Final   Culture NO GROWTH 5 DAYS  Final   Report Status 08/17/2015 FINAL  Final  Wound culture     Status: None  Collection Time: 07/30/2015  9:20 AM  Result Value Ref Range Status   Specimen Description DECUBITIS  Final   Special Requests Normal  Final   Gram Stain   Final    FEW WBC SEEN MANY GRAM NEGATIVE RODS RARE GRAM POSITIVE COCCI    Culture   Final    HEAVY GROWTH ESCHERICHIA COLI MODERATE GROWTH ENTEROBACTER AEROGENES PROTEUS MIRABILIS HEAVY GROWTH ENTEROCOCCUS SPECIES VRE HAVE INTRINSIC RESISTANCE TO MOST COMMONLY USED ANTIBIOTICS AND THE ABILITY TO ACQUIRE RESISTANCE TO MOST AVAILABLE ANTIBIOTICS.    Report Status 08/16/2015 FINAL  Final   Organism ID, Bacteria ESCHERICHIA COLI  Final   Organism ID, Bacteria ENTEROBACTER AEROGENES  Final   Organism ID, Bacteria PROTEUS MIRABILIS  Final   Organism ID, Bacteria ENTEROCOCCUS SPECIES  Final      Susceptibility   Enterobacter aerogenes - MIC*    CEFTAZIDIME <=1 SENSITIVE Sensitive     CEFAZOLIN >=64 RESISTANT Resistant     CEFTRIAXONE <=1 SENSITIVE  Sensitive     CIPROFLOXACIN <=0.25 SENSITIVE Sensitive     GENTAMICIN <=1 SENSITIVE Sensitive     IMIPENEM 1 SENSITIVE Sensitive     TRIMETH/SULFA <=20 SENSITIVE Sensitive     * MODERATE GROWTH ENTEROBACTER AEROGENES   Escherichia coli - MIC*    AMPICILLIN <=2 SENSITIVE Sensitive     CEFTAZIDIME <=1 SENSITIVE Sensitive     CEFAZOLIN <=4 SENSITIVE Sensitive     CEFTRIAXONE <=1 SENSITIVE Sensitive     CIPROFLOXACIN <=0.25 SENSITIVE Sensitive     GENTAMICIN <=1 SENSITIVE Sensitive     IMIPENEM <=0.25 SENSITIVE Sensitive     TRIMETH/SULFA <=20 SENSITIVE Sensitive     * HEAVY GROWTH ESCHERICHIA COLI   Proteus mirabilis - MIC*    AMPICILLIN >=32 RESISTANT Resistant     CEFTAZIDIME <=1 SENSITIVE Sensitive     CEFAZOLIN 8 SENSITIVE Sensitive     CEFTRIAXONE <=1 SENSITIVE Sensitive     CIPROFLOXACIN <=0.25 SENSITIVE Sensitive     GENTAMICIN <=1 SENSITIVE Sensitive     IMIPENEM 1 SENSITIVE Sensitive     TRIMETH/SULFA <=20 SENSITIVE Sensitive     * PROTEUS MIRABILIS   Enterococcus species - MIC*    AMPICILLIN >=32 RESISTANT Resistant     VANCOMYCIN >=32 RESISTANT Resistant     GENTAMICIN SYNERGY SENSITIVE Sensitive     TETRACYCLINE Value in next row Resistant      RESISTANT>=16    * HEAVY GROWTH ENTEROCOCCUS SPECIES  MRSA PCR Screening     Status: None   Collection Time: 08/01/2015  2:38 PM  Result Value Ref Range Status   MRSA by PCR NEGATIVE NEGATIVE Final    Comment:        The GeneXpert MRSA Assay (FDA approved for NASAL specimens only), is one component of a comprehensive MRSA colonization surveillance program. It is not intended to diagnose MRSA infection nor to guide or monitor treatment for MRSA infections.   Blood culture (single)     Status: None   Collection Time: 08/19/2015  3:36 PM  Result Value Ref Range Status   Specimen Description BLOOD RIGHT ASSIST CONTROL  Final   Special Requests BOTTLES DRAWN AEROBIC AND ANAEROBIC  Final   Culture NO GROWTH 5 DAYS  Final    Report Status 08/17/2015 FINAL  Final  Wound culture     Status: None   Collection Time: 08/13/15 12:37 PM  Result Value Ref Range Status   Specimen Description WOUND  Final   Special Requests Normal  Final  Gram Stain   Final    FEW WBC SEEN TOO NUMEROUS TO COUNT GRAM NEGATIVE RODS FEW GRAM POSITIVE COCCI    Culture   Final    HEAVY GROWTH ESCHERICHIA COLI MODERATE GROWTH PROTEUS MIRABILIS LIGHT GROWTH KLEBSIELLA PNEUMONIAE MODERATE GROWTH ENTEROCOCCUS GALLINARUM CRITICAL RESULT CALLED TO, READ BACK BY AND VERIFIED WITH: [facility] BORBA AT 1042 08/16/15 DV    Report Status 08/17/2015 FINAL  Final   Organism ID, Bacteria ESCHERICHIA COLI  Final   Organism ID, Bacteria PROTEUS MIRABILIS  Final   Organism ID, Bacteria KLEBSIELLA PNEUMONIAE  Final   Organism ID, Bacteria ENTEROCOCCUS GALLINARUM  Final      Susceptibility   Escherichia coli - MIC*    AMPICILLIN >=32 RESISTANT Resistant     CEFTAZIDIME 4 RESISTANT Resistant     CEFAZOLIN >=64 RESISTANT Resistant     CEFTRIAXONE 16 RESISTANT Resistant     GENTAMICIN 2 SENSITIVE Sensitive     IMIPENEM >=16 RESISTANT Resistant     TRIMETH/SULFA <=20 SENSITIVE Sensitive     Extended ESBL POSITIVE Resistant     PIP/TAZO Value in next row Resistant      RESISTANT>=128    CIPROFLOXACIN Value in next row Sensitive      SENSITIVE<=0.25    * HEAVY GROWTH ESCHERICHIA COLI   Klebsiella pneumoniae - MIC*    AMPICILLIN Value in next row Resistant      SENSITIVE<=0.25    CEFTAZIDIME Value in next row Resistant      SENSITIVE<=0.25    CEFAZOLIN Value in next row Resistant      SENSITIVE<=0.25    CEFTRIAXONE Value in next row Resistant      SENSITIVE<=0.25    CIPROFLOXACIN Value in next row Resistant      SENSITIVE<=0.25    GENTAMICIN Value in next row Sensitive      SENSITIVE<=0.25    IMIPENEM Value in next row Resistant      SENSITIVE<=0.25    TRIMETH/SULFA Value in next row Resistant      SENSITIVE<=0.25    PIP/TAZO Value in next  row Resistant      RESISTANT>=128    * LIGHT GROWTH KLEBSIELLA PNEUMONIAE   Proteus mirabilis - MIC*    AMPICILLIN Value in next row Resistant      RESISTANT>=128    CEFTAZIDIME Value in next row Sensitive      RESISTANT>=128    CEFAZOLIN Value in next row Sensitive      RESISTANT>=128    CEFTRIAXONE Value in next row Sensitive      RESISTANT>=128    CIPROFLOXACIN Value in next row Sensitive      RESISTANT>=128    GENTAMICIN Value in next row Sensitive      RESISTANT>=128    IMIPENEM Value in next row Sensitive      RESISTANT>=128    TRIMETH/SULFA Value in next row Sensitive      RESISTANT>=128    PIP/TAZO Value in next row Sensitive      SENSITIVE<=4    * MODERATE GROWTH PROTEUS MIRABILIS   Enterococcus gallinarum - MIC*    AMPICILLIN Value in next row Resistant      SENSITIVE<=4    GENTAMICIN SYNERGY Value in next row Sensitive      SENSITIVE<=4    CIPROFLOXACIN Value in next row Resistant      RESISTANT>=8    TETRACYCLINE Value in next row Resistant      RESISTANT>=16    * MODERATE GROWTH ENTEROCOCCUS GALLINARUM  Wound culture  Status: None   Collection Time: 08/15/15  2:53 PM  Result Value Ref Range Status   Specimen Description WOUND  Final   Special Requests NONE  Final   Gram Stain FEW WBC SEEN NO ORGANISMS SEEN   Final   Culture NO GROWTH 3 DAYS  Final   Report Status 08/18/2015 FINAL  Final  C difficile quick scan w PCR reflex     Status: None   Collection Time: 08/17/15 11:34 AM  Result Value Ref Range Status   C Diff antigen NEGATIVE NEGATIVE Final   C Diff toxin NEGATIVE NEGATIVE Final   C Diff interpretation Negative for C. difficile  Final  Stool culture     Status: None   Collection Time: 09/03/15  3:51 PM  Result Value Ref Range Status   Specimen Description STOOL  Final   Special Requests Immunocompromised  Final   Culture   Final    NO SALMONELLA OR SHIGELLA ISOLATED No Pathogenic E. coli detected NO CAMPYLOBACTER DETECTED    Report  Status 09/07/2015 FINAL  Final  C difficile quick scan w PCR reflex     Status: None   Collection Time: 09/13/15 12:51 AM  Result Value Ref Range Status   C Diff antigen NEGATIVE NEGATIVE Final   C Diff toxin NEGATIVE NEGATIVE Final   C Diff interpretation Negative for C. difficile  Final  Culture, blood (routine x 2)     Status: None   Collection Time: 09/16/15 11:24 AM  Result Value Ref Range Status   Specimen Description BLOOD LEFT HAND  Final   Special Requests BOTTLES DRAWN AEROBIC AND ANAEROBIC  1CC  Final   Culture NO GROWTH 5 DAYS  Final   Report Status 09/21/2015 FINAL  Final  Culture, blood (routine x 2)     Status: None   Collection Time: 09/16/15 12:23 PM  Result Value Ref Range Status   Specimen Description BLOOD RIGHT HAND  Final   Special Requests BOTTLES DRAWN AEROBIC AND ANAEROBIC  1CC  Final   Culture  Setup Time   Final    GRAM POSITIVE COCCI AEROBIC BOTTLE ONLY CRITICAL RESULT CALLED TO, READ BACK BY AND VERIFIED WITH: TESS THOMAS,RN 09/17/2015 0631 BY JRS.    Culture   Final    ENTEROCOCCUS FAECALIS AEROBIC BOTTLE ONLY VRE HAVE INTRINSIC RESISTANCE TO MOST COMMONLY USED ANTIBIOTICS AND THE ABILITY TO ACQUIRE RESISTANCE TO MOST AVAILABLE ANTIBIOTICS. CRITICAL RESULT CALLED TO, READ BACK BY AND VERIFIED WITH: CHERYL SMITH AT 4098 09/19/15 DV    Report Status 09/21/2015 FINAL  Final   Organism ID, Bacteria ENTEROCOCCUS FAECALIS  Final      Susceptibility   Enterococcus faecalis - MIC*    AMPICILLIN <=2 SENSITIVE Sensitive     LINEZOLID 2 SENSITIVE Sensitive     CIPROFLOXACIN Value in next row Resistant      RESISTANT>=8    TETRACYCLINE Value in next row Resistant      RESISTANT>=16    VANCOMYCIN Value in next row Resistant      RESISTANT>=32    GENTAMICIN SYNERGY Value in next row Resistant      RESISTANT>=32    * ENTEROCOCCUS FAECALIS  Culture, respiratory (NON-Expectorated)     Status: None   Collection Time: 09/16/15  3:50 PM  Result Value Ref Range  Status   Specimen Description TRACHEAL ASPIRATE  Final   Special Requests Immunocompromised  Final   Gram Stain   Final    FEW WBC SEEN GOOD SPECIMEN -  80-90% WBCS RARE GRAM NEGATIVE RODS    Culture LIGHT GROWTH PSEUDOMONAS AERUGINOSA  Final   Report Status 09/19/2015 FINAL  Final   Organism ID, Bacteria PSEUDOMONAS AERUGINOSA  Final      Susceptibility   Pseudomonas aeruginosa - MIC*    CEFTAZIDIME 8 SENSITIVE Sensitive     CIPROFLOXACIN 2 INTERMEDIATE Intermediate     GENTAMICIN >=16 RESISTANT Resistant     IMIPENEM >=16 RESISTANT Resistant     PIP/TAZO Value in next row Sensitive      SENSITIVE32    CEFEPIME Value in next row Sensitive      SENSITIVE8    LEVOFLOXACIN Value in next row Resistant      RESISTANT>=8    * LIGHT GROWTH PSEUDOMONAS AERUGINOSA  Culture, expectorated sputum-assessment     Status: None   Collection Time: 09/22/15  2:09 PM  Result Value Ref Range Status   Specimen Description ENDOTRACHEAL  Final   Special Requests Normal  Final   Sputum evaluation THIS SPECIMEN IS ACCEPTABLE FOR SPUTUM CULTURE  Final   Report Status 09/24/2015 FINAL  Final  Culture, respiratory (NON-Expectorated)     Status: None   Collection Time: 09/22/15  2:09 PM  Result Value Ref Range Status   Specimen Description ENDOTRACHEAL  Final   Special Requests Normal Reflexed from [id number]  Final   Gram Stain   Final    FEW WBC SEEN FEW GRAM NEGATIVE RODS POOR SPECIMEN - LESS THAN 70% WBCS    Culture   Final    MODERATE GROWTH PSEUDOMONAS AERUGINOSA LIGHT GROWTH KLEBSIELLA PNEUMONIAE REFER TO SENSITIVITIES FROM PREVIOUS CULTURE FOR ORG 2    Report Status 10/01/2015 FINAL  Final   Organism ID, Bacteria PSEUDOMONAS AERUGINOSA  Final   Organism ID, Bacteria KLEBSIELLA PNEUMONIAE  Final      Susceptibility   Pseudomonas aeruginosa - MIC*    CEFTAZIDIME 4 SENSITIVE Sensitive     CIPROFLOXACIN 2 INTERMEDIATE Intermediate     GENTAMICIN >=16 RESISTANT Resistant     IMIPENEM >=16  RESISTANT Resistant     PIP/TAZO Value in next row Sensitive      SENSITIVE16    * MODERATE GROWTH PSEUDOMONAS AERUGINOSA  Tissue culture     Status: None   Collection Time: 09/24/15  6:44 AM  Result Value Ref Range Status   Specimen Description BONE  Final   Special Requests Normal  Final   Gram Stain MODERATE WBC SEEN FEW GRAM NEGATIVE RODS   Final   Culture   Final    MODERATE GROWTH ESCHERICHIA COLI MODERATE GROWTH KLEBSIELLA PNEUMONIAE ESBL-EXTENDED SPECTRUM BETA LACTAMASE-THE ORGANISM IS RESISTANT TO PENICILLINS, CEPHALOSPORINS AND AZTREONAM ACCORDING TO CLSI M100-S15 VOL.25 N01 JAN 2005. ORGANISM 1 This organism isolate is resistant to one or more antiotic agents in three or more antimicrobial categories.  Suggest Infectious Disease consult.   ORGANISM 2 CRITICAL RESULT CALLED TO, READ BACK BY AND VERIFIED WITH: RN Mardene Celeste LINDSAY 09/27/15 1005AM    Report Status 09/28/2015 FINAL  Final   Organism ID, Bacteria ESCHERICHIA COLI  Final   Organism ID, Bacteria KLEBSIELLA PNEUMONIAE  Final      Susceptibility   Escherichia coli - MIC*    AMPICILLIN >=32 RESISTANT Resistant     CEFTAZIDIME 4 RESISTANT Resistant     CEFAZOLIN >=64 RESISTANT Resistant     CEFTRIAXONE 8 RESISTANT Resistant     GENTAMICIN <=1 SENSITIVE Sensitive     IMIPENEM 8 RESISTANT Resistant     TRIMETH/SULFA <=20 SENSITIVE  Sensitive     Extended ESBL POSITIVE Resistant     PIP/TAZO Value in next row Resistant      RESISTANT>=128    * MODERATE GROWTH ESCHERICHIA COLI   Klebsiella pneumoniae - MIC*    AMPICILLIN Value in next row Resistant      RESISTANT>=128    CEFTAZIDIME Value in next row Resistant      RESISTANT>=128    CEFAZOLIN Value in next row Resistant      RESISTANT>=128    CEFTRIAXONE Value in next row Resistant      RESISTANT>=128    CIPROFLOXACIN Value in next row Resistant      RESISTANT>=128    GENTAMICIN Value in next row Resistant      RESISTANT>=128    IMIPENEM Value in next row  Resistant      RESISTANT>=128    TRIMETH/SULFA Value in next row Sensitive      RESISTANT>=128    PIP/TAZO Value in next row Resistant      RESISTANT>=128    * MODERATE GROWTH KLEBSIELLA PNEUMONIAE  Anaerobic culture     Status: None   Collection Time: 09/24/15  3:55 PM  Result Value Ref Range Status   Specimen Description BONE  Final   Special Requests Normal  Final   Culture NO ANAEROBES ISOLATED  Final   Report Status 09/29/2015 FINAL  Final  Culture, fungus without smear     Status: None   Collection Time: 09/24/15  3:55 PM  Result Value Ref Range Status   Specimen Description BONE  Final   Special Requests Normal  Final   Culture NO FUNGUS ISOLATED AFTER 24 DAYS  Final   Report Status 10/18/2015 FINAL  Final  C difficile quick scan w PCR reflex     Status: None   Collection Time: 10/07/15  2:02 PM  Result Value Ref Range Status   C Diff antigen NEGATIVE NEGATIVE Final   C Diff toxin NEGATIVE NEGATIVE Final   C Diff interpretation Negative for C. difficile  Final  Culture, blood (routine x 2)     Status: None   Collection Time: 10/16/15  9:52 AM  Result Value Ref Range Status   Specimen Description BLOOD LEFT HAND  Final   Special Requests   Final    BOTTLES DRAWN AEROBIC AND ANAEROBIC  AER 6CC ANA 4CC   Culture NO GROWTH 6 DAYS  Final   Report Status 10/22/2015 FINAL  Final  Culture, blood (routine x 2)     Status: None   Collection Time: 10/16/15 12:25 PM  Result Value Ref Range Status   Specimen Description BLOOD LEFT HAND  Final   Special Requests BOTTLES DRAWN AEROBIC AND ANAEROBIC  5CC  Final   Culture NO GROWTH 6 DAYS  Final   Report Status 10/22/2015 FINAL  Final  Culture, expectorated sputum-assessment     Status: None   Collection Time: 10/18/15  1:15 PM  Result Value Ref Range Status   Specimen Description SPUTUM  Final   Special Requests NONE  Final   Sputum evaluation THIS SPECIMEN IS ACCEPTABLE FOR SPUTUM CULTURE  Final   Report Status 10/18/2015  FINAL  Final  Culture, respiratory (NON-Expectorated)     Status: None   Collection Time: 10/18/15  1:15 PM  Result Value Ref Range Status   Specimen Description SPUTUM  Final   Special Requests NONE Reflexed from [id number]  Final   Gram Stain   Final    GOOD SPECIMEN - 80-90%  WBCS MODERATE WBC SEEN MANY GRAM NEGATIVE RODS    Culture   Final    HEAVY GROWTH PSEUDOMONAS AERUGINOSA MODERATE GROWTH KLEBSIELLA PNEUMONIAE CRITICAL RESULT CALLED TO, READ BACK BY AND VERIFIED WITHRod Mae AT 1610 10/21/15 DV KLEBSIELLA PNEUMONIAE This organism isolate is resistant to one or more antiotic agents in three or more antimicrobial categories.  Suggest Infectious Disease  consult.      Report Status 10/22/2015 FINAL  Final   Organism ID, Bacteria PSEUDOMONAS AERUGINOSA  Final   Organism ID, Bacteria KLEBSIELLA PNEUMONIAE  Final      Susceptibility   Klebsiella pneumoniae - MIC*    AMPICILLIN >=32 RESISTANT Resistant     CEFAZOLIN >=64 RESISTANT Resistant     CEFTRIAXONE >=64 RESISTANT Resistant     CIPROFLOXACIN >=4 RESISTANT Resistant     GENTAMICIN >=16 RESISTANT Resistant     IMIPENEM >=16 RESISTANT Resistant     NITROFURANTOIN >=512 RESISTANT Resistant     TRIMETH/SULFA <=20 SENSITIVE Sensitive     * MODERATE GROWTH KLEBSIELLA PNEUMONIAE   Pseudomonas aeruginosa - MIC*    CEFTAZIDIME 4 SENSITIVE Sensitive     CIPROFLOXACIN >=4 RESISTANT Resistant     GENTAMICIN 8 INTERMEDIATE Intermediate     IMIPENEM >=16 RESISTANT Resistant     PIP/TAZO Value in next row Sensitive      SENSITIVE32    * HEAVY GROWTH PSEUDOMONAS AERUGINOSA  C difficile quick scan w PCR reflex     Status: None   Collection Time: 10/18/15  4:39 PM  Result Value Ref Range Status   C Diff antigen NEGATIVE NEGATIVE Final   C Diff toxin NEGATIVE NEGATIVE Final   C Diff interpretation Negative for C. difficile  Final    Coagulation Studies: No results for input(s): LABPROT, INR in the last 72  hours.  Urinalysis: No results for input(s): COLORURINE, LABSPEC, PHURINE, GLUCOSEU, HGBUR, BILIRUBINUR, KETONESUR, PROTEINUR, UROBILINOGEN, NITRITE, LEUKOCYTESUR in the last 72 hours.  Invalid input(s): APPERANCEUR    Imaging: No results found.   Medications:   . feeding supplement (JEVITY 1.5 CAL/FIBER) 1,000 mL (11/01/15 0520)  . norepinephrine Stopped (10/08/15 0530)   . albumin human  25 g Intravenous Once  . amantadine  200 mg Per Tube Q7 days  . antiseptic oral rinse  7 mL Mouth Rinse QID  . aspirin  81 mg Oral Daily  . chlorhexidine gluconate  15 mL Mouth Rinse BID  . epoetin (EPOGEN/PROCRIT) injection  10,000 Units Intravenous Q M,W,F-HD  . escitalopram  10 mg Oral Daily  . feeding supplement (PRO-STAT SUGAR FREE 64)  30 mL Oral 6 X Daily  . folic acid  1 mg Oral Daily  . free water  20 mL Per Tube 6 times per day  . heparin subcutaneous  5,000 Units Subcutaneous Q12H  . hydrocortisone  10 mg Per Tube BID  . insulin aspart  0-9 Units Subcutaneous 6 times per day  . lidocaine  1 patch Transdermal Q24H  . loperamide  2 mg Oral BID  . midodrine  5 mg Per Tube TID WC  . multivitamin  5 mL Oral Daily  . pantoprazole sodium  40 mg Per Tube Daily  . saccharomyces boulardii  250 mg Oral BID  . sodium chloride  10-40 mL Intracatheter Q12H  . sodium hypochlorite   Irrigation BID  . sulfamethoxazole-trimethoprim  2.5 tablet Per Tube q1800  . thiamine  100 mg Per Tube Daily   sodium chloride, sodium chloride, acetaminophen (TYLENOL) oral liquid  160 mg/5 mL, HYDROmorphone (DILAUDID) injection, ipratropium-albuterol, morphine injection, ondansetron (ZOFRAN) IV, oxyCODONE, sodium chloride, zinc oxide  Assessment/ Plan:  49 y.o. 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 Admitted on 08/03/2015  1. End-stage renal disease on hemodialysis on HD MWF. The patient has been on dialysis since October of 2014. R IJ permcath. Dialysis treatment with IV albumin for oncotic support. Midodrine and hydrocortisone ordered for blood pressure support.  - Pt seen during HD, tolerating well, UF target 1kg.  2. Anemia of CKD. Hgb up to 8.0, continue epogen 10000 units IV with HD.   3. Secondary hyperparathyroidism: PTH 50, phos normal, continue to montior bone mineral metabolism parameters.   4. Sepsis: VRE in blood. Bone cultures E. Coli and klebsiella - Treatment as per ID and ICU team: daptomycin completed on 11/10 and now on high dose trimethoprim/sulfamethoxazole.   5. Acute resp failure:  -remains on the ventilator.  FiO2 has been stable at 30% over the past several days, continue vent support  6. Generalized edema - UF target is 1kg with HD, will use albumin to help support BP during HD.  7.  Hyperkalemia:  K down to 5.2, will place on 2K bath.    LOS: 82 Courtney [name] 12/14/20169:45 AM

## 2015-11-02 NOTE — Progress Notes (Signed)
MEDICATION RELATED CONSULT NOTE - Follow up   Pharmacy Consult for Renal dosing  Allergies  Allergen Reactions  . Contrast Media [Iodinated Diagnostic Agents] Anaphylaxis  . Ampicillin Rash    Patient Measurements: Height:  (SCALE BROKE) Weight:  (scales broken) IBW/kg (Calculated) : 66.2  Vital Signs: Temp: 97.7 F (36.5 C) (12/14 1300) Temp Source: Axillary (12/14 1300) BP: 137/44 mmHg (12/14 1254) Pulse Rate: 97 (12/14 1254)  Recent Labs  10/31/15 0448 11/01/15 1109  WBC 9.4 11.9*  HGB 7.7* 8.0*  HCT 24.4* 26.4*  PLT 257 250  CREATININE 1.43* 1.11*  MG  --  2.1  PHOS 5.2* 4.3  ALBUMIN 2.0*  --    Estimated Creatinine Clearance: 77.8 mL/min (by C-G formula based on Cr of 1.11).   Assessment: 49 yo patient on HD. Pharmacy consulted for renal dosing of medications.   Plan:  No medications require adjustment at present. Pharmacy will continue to monitor patients medications and dose adjust as needed.    Luisa HartScott Cortavius Montesinos, PharmD 11/02/2015

## 2015-11-02 NOTE — Progress Notes (Signed)
[facility] Medicine Progess Note  Name: Courtney [name] MRN: [id number] DOB: [birth year]    ADMISSION DATE:  08/02/2015   INITIAL PRESENTATION:  49 AAF who has been in medical facilities (hosp, LTAC, rehab) for 2 yrs following gastric bypass surgery with multiple complications. Now with chronic trach, ESRD, profound debilitation, severe sacral pressure ulcer. Was seemingly making progress and transferred to rehab facility approx one week prior to this admission. She was sent to [facility] ED with AMS and hypotension. Working dx of severe sepsis/septic shock due to infected sacral pressure ulcer. Since admission. Her course is been very complicated with numerous complications including septic shock and GI bleeding. Now with failure to wean from vent    INTERVAL HISTORY: alert, noted to have some mild tachypnea when placed on lower settings of pressure support. Placed back on the vent for the majority of the day and night. We will attempt pressure support again today,plan for HD today I have spoken to the husband reintroduced ID of a possible PEG tube placement in the near future, he states that she does have a complex GI anatomy and the obstacle in the past was finding a surgeon suitable to place an NG tube, at this time he again would like patient to be liberated from the vent so that she may move forward to taking oral intake.   difficulty weaning from vent, placed on PRVC mode due to WOB will attempt to wean as tolerated HD as needed per nephrology  Summary of MAJOR EVENTS/TEST RESULTS: Admission 02/07/14-05/07/14 Admission 07/21/14-09/06/14 Discharged to [facility]. Pt had palliative consult at that time, were asked to sign off by husband.  09/23 CT head: NAD 09/23 EEG: no epileptiform activity 09/23 PRBCs for Hgb 6.4 09/24 bedside debridement of sacral wound. Abscess drained 09/25 Off vasopressors. More alert. No distress. Worsening thrombocytopenia. Vanc DC'd 09/29  Dr.  Sampson Goon (I.D) excused from the case by patient's husband. 10/02 MRI -multiple infarcts 10/03 tracheal bleeding- transfused platelets 10/03 hospitalist service excused from the case by patient's husband 10/03 Echocardiogram ejection fraction was 55-60%, pulmonary systolic pressure was 39 mmHg 40/98 restart TF's at lower rate, attempt reg HD  10/12 Transferred to med-surg floor. Remains on PCCM service 10/14 SLP eval: pt unable to tolerate PMV adequately 10/17-will re-attempt PM valve-discussed with Speech therapist 10/18 passed swallow eval-start pureed thick foods no thin liquids-continue NG feeds 10/19 transferred to step down for sepsis/aspiration pneumonia 10/19 cxr shows RLL opacity 10/21 sacral decub debride at bedside by surgery 10/22 started back on vasopressors while on HD 10/27 CT with osteo, R hip fx, unable to identify tip of dubhoff tube - sent for fluoro study 10/28 Ortho consultation: I do not feel that she is a surgical candidate. Therefore, I feel that it would best to manage this fracture nonsurgically and allow it to heal by itself over time, which it should.  10/28 Gen Surg consultation: Due to the lack of free air or free flow of contrast and the peritoneum there is no indication for any surgical intervention on this. Would recommend pulling back the feeding tube 1-2 cm. Would recheck an abdominal film to confirm no pneumoperitoneum in the morning. Absence of any changes okay to continue using Dobbhoff for feeding and medications. 10/28 gastrograffin study: The study confirms that the feeding tube pes perforated through the duodenum and the tip is within a cavity that fills with injected contrast. The cavity does appear walled off 11/4 refuses oral feeds, continue TF's CT reviewed: T8-T9  discitis/osteomyelitis, RT HIP FRACTURE 11/5 placed back on vasopressors, placed back on Vent due to resp acidosis, levophed turned off 11/6 afternoon 11/5 PM Trach changed out due to cuff  leak, #6 Shiley cuffed, 11/6 dubhoff occluded, removed and replaced, new dubhoff shows tube in the antral stomach 11/15 refusing to take oral feeds 11/18 placed back on Vent for increased WOB,SOB. 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 10/18/2015; the patient developed a new left basal pneumonia; with recurrent sepsis.  10/19/2015; fevers resolved with IV acetaminophen. Tube feeds changed from vital to Jevity.  12/7 CXR with stable b/l opacities.  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 Pneumoniae, P. Mirabilis, VRE Wound 9/26 >> No growth Blood 9/23 >> NEG CDiff 9/27>>neg Stool Cx 10/15>> negative Cdiff 10/25>>neg Trach Aspirate 10/28>> light growth pseudomonas Blood 10/28 >> 1/2 GPC >>  Sputum cultures obtained 09/22/15 due to mucus plugging>>Pseudomonas BONE TISSUE Cx 11/3>>E.coli (ESBL), k. Pneumonia (daptomycin and septra) Bld Cx 11/27>>negative thus far.  Trach Asp 11/27>> grew out Pseudomonas, and Klebsiella pneumonia.  ANTIMICROBIALS:  Aztreonam 9/23 >> 9/24 Vanc 9/23 >> 9/25 Vanc 9/26>>9/27 Daptomycin 9/27>> 10/12, 10/28>> off Meropenem 9/23 >> 10/14, 10/28 >> 10/31 Septra 11/8>> Zosyn 11/27,>> 11/30. Levaquin 11/29>> 11/29. Ceftaz 12/1>>  VITAL SIGNS: Temp:  [98.5 F (36.9 C)-99.6 F (37.6 C)] 99.6 F (37.6 C) (12/14 0900) Pulse Rate:  [88-105] 96 (12/14 1145) Resp:  [0-40] 12 (12/14 1145) BP: (110-140)/(44-119) 140/54 mmHg (12/14 1145) SpO2:  [94 %-100 %] 97 % (12/14  1145) FiO2 (%):  [30 %] 30 % (12/14 0848) HEMODYNAMICS:   VENTILATOR SETTINGS: Vent Mode:  [-] PRVC FiO2 (%):  [30 %] 30 % Set Rate:  [14 bmp] 14 bmp Vt Set:  [530 mL] 530 mL PEEP:  [5 cmH20] 5 cmH20 Pressure Support:  [20 cmH20] 20 cmH20 Plateau Pressure:  [20 cmH20] 20 cmH20 INTAKE / OUTPUT:  Intake/Output Summary (Last 24 hours) at 11/02/15 1157 Last data filed at 11/02/15 0800  Gross per 24 hour  Intake   1050 ml  Output      0 ml  Net   1050 ml   PHYSICAL EXAMINATION: Physical Examination:   VS: BP 140/54 mmHg  Pulse 96  Temp(Src) 99.6 F (37.6 C) (Oral)  Resp 12  Ht 5\' 9"  (1.753 m)  Wt 229 lb 4.5 oz (104 kg)  BMI 33.84 kg/m2  SpO2 97%  LMP  (LMP Unknown)  General Appearance: No resp distress, coarse upper airway sounds, on vent Neuro: profoundly diffusely weak, alert today, sleeping but easily arousable HEENT: cushingoid facies, PERRLA, EOM intact Neck: trach site clean Pulmonary: clear anteriorly Cardiovascular: reg, + syst murmur Abdomen: soft, NT, +BS Extremities: warm, R>L UE edema +sacral wound    LABORATORY PANEL:   CBC  Recent Labs Lab 11/01/15 1109  WBC 11.9*  HGB 8.0*  HCT 26.4*  PLT 250    Chemistries   Recent Labs Lab 11/01/15 1109  NA 147*  K 5.2*  CL 102  CO2 35*  GLUCOSE 126*  BUN  60*  CREATININE 1.11*  CALCIUM 8.8*  MG 2.1  PHOS 4.3       IMPRESSION/PLAN (current major issues):  Patient with extreme pain, Cried this AM, pain meds given, per nursing report patient is "tired of being sick"  And nods yes to this question, more pain meds given  Recurrent sepsis, due to pneumonia/sacral wound with chronic osteo of sacrum and  spine  Anemia with macrocytosis -Uncertain if this is due to chronic slow GI bleed or simple critical care associated anemia. She has already receiving folate, thiamine, and multivitamin. Her anemia has not responded to oral medications, this may be due to her GI malabsorption.   Pneumonia  with sepsis-remains on vent support -Chest x-ray shows new left lower lobe infiltrate,which is now improving.  Sputum cultures show pseudomonas. The patient is once again, vent dependent. She has developed again recurrent sepsis.Marland Kitchen Sputum cultures showing Pseudomonas, and Klebsiella, currently covered with this. Ceftaz total 10 days, stop date 12/11 Weaning trial daily off MV - currently on PSV trials with plan to transition back to TCT  Worsening sacral decubitus ulcer, stage IV. With spin involvement -The patient is already had a sacral bone scraping, and has been placed on Septra for antibiotic, per ID recommendations. Her sacral decubitus does not appear to be improving and in fact appears to be worsening. In addition, she has several new smaller areas of tissue breakdown.  End-stage renal disease. -Continue on hemodialysis per nephrology.  Diarrhea. -loperamide 2 twice daily scheduled, and changing her tube feeds back to Jevity.   Trach Care -changed on 11/5 to #6 Shiley due to cuff leak   Patient appears to be stable for transfer to an LTAC facility at this point in time.   Additional Chronic issues and complicating factors during this admission.  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  Thrombocytopenia, resolved - HIT panel negative 9/25 Recurrent Severe sepsis, due to sacral pressure ulcer DM 2, controlled.  Chronic steroid therapy, for a history of of adrenal insufficiency Incidental finding of R hip fx - conservative mgmt. Waxing and waning encephalopathy - she was evaluated by both the psychiatry and neurology services, and it was deemed that the patient lacks decisional capacity at this time. 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 Husband declined TEE Profound deconditioning-criticall illness neuropathy Chronic pain PNEUMONIA    The Patient requires high complexity decision making for assessment and  support, frequent evaluation and titration of therapies, application of advanced monitoring technologies and extensive interpretation of multiple databases. Critical Care Time devoted to patient care services described in this note is 35 minutes.   Overall, patient is critically ill, prognosis is guarded.  Patient with Multiorgan failure and at high risk for cardiac arrest and death.    Lucie Leather, M.D.  [facility]  Medical Director [facility] Medical Director [facility] Cardio-Pulmonary Department

## 2015-11-03 ENCOUNTER — Inpatient Hospital Stay: Payer: 59

## 2015-11-03 DIAGNOSIS — J9621 Acute and chronic respiratory failure with hypoxia: Secondary | ICD-10-CM

## 2015-11-03 LAB — GLUCOSE, CAPILLARY
Glucose-Capillary: 116 mg/dL — ABNORMAL HIGH (ref 65–99)
Glucose-Capillary: 90 mg/dL (ref 65–99)
Glucose-Capillary: 75 mg/dL (ref 65–99)
Glucose-Capillary: 117 mg/dL — ABNORMAL HIGH (ref 65–99)
Glucose-Capillary: 145 mg/dL — ABNORMAL HIGH (ref 65–99)
Glucose-Capillary: 151 mg/dL — ABNORMAL HIGH (ref 65–99)
Glucose-Capillary: 127 mg/dL — ABNORMAL HIGH (ref 65–99)

## 2015-11-03 MED ORDER — MIRTAZAPINE 15 MG PO TBDP
7.5000 mg | ORAL_TABLET | Freq: Every day | ORAL | Status: DC
  Administered 2015-11-03 – 2015-11-21 (×19): 7.5 mg via ORAL
  Filled 2015-11-03: qty 0.5
  Filled 2015-11-03: qty 1
  Filled 2015-11-03: qty 2
  Filled 2015-11-03 (×6): qty 1
  Filled 2015-11-03: qty 0.5
  Filled 2015-11-03: qty 1
  Filled 2015-11-03: qty 7
  Filled 2015-11-03 (×7): qty 1
  Filled 2015-11-03: qty 2

## 2015-11-03 NOTE — Progress Notes (Signed)
MEDICATION RELATED CONSULT NOTE - Follow up   Pharmacy Consult for Renal dosing  Allergies  Allergen Reactions  . Contrast Media [Iodinated Diagnostic Agents] Anaphylaxis  . Ampicillin Rash    Patient Measurements: Height:  (SCALE BROKE) Weight:  (bed scale broken) IBW/kg (Calculated) : 66.2  Vital Signs: Temp: 99.8 F (37.7 C) (12/15 0800) Temp Source: Oral (12/15 0800) BP: 122/46 mmHg (12/15 1200) Pulse Rate: 101 (12/15 1200)  Recent Labs  11/01/15 1109  WBC 11.9*  HGB 8.0*  HCT 26.4*  PLT 250  CREATININE 1.11*  MG 2.1  PHOS 4.3   Estimated Creatinine Clearance: 77.8 mL/min (by C-G formula based on Cr of 1.11).   Assessment: 49 yo patient on HD. Pharmacy consulted for renal dosing of medications.   Plan:  No medications require adjustment at present. Pharmacy will continue to monitor patients medications and dose adjust as needed.    Luisa HartScott Carlyle Achenbach, PharmD 11/03/2015

## 2015-11-03 NOTE — Consult Note (Signed)
  Psychiatry: Follow-up consult for this 49 year old woman with long hospital stay ventilator-dependent multiple severe illnesses. Reinterviewed the patient today. Reevaluated the chart. Spoke with the nurse on duty.  Patient gave me mixed signals indicating at one point that she felt a little better than yesterday but then indicating that she did not feel like she was making any progress. Again we could only communicate by nods and head shakes. Patient became sad as I talked about depression. It however seem to engage. Made eye contact.  Unable to get an effective review of systems. Patient indicates that she is feeling uncomfortable but not in any specific way.  Patient is awake and alert. Responsive to voice. Appears to be making attempts to respond appropriately to verbal questions. Affect flat and tearful. Very difficult to evaluate thought content.  As far as capacity, nursing told me this evening that it was her impression that there was concern about whether the patient's input was being solicited enough for decisions or if her family were making all of the decisions without consulting her. I will repeat and clarify that I can't absolutely determine capacity but I can say that she clearly seems to have preferences and opinions about things. She again indicates that she feels like she wants more information. I think it would be crucial with any kind of decision and really every day to directly address the patient and inform her of what the current situation is, whether there are any important decisions to be made, and what is expected of her. Certainly if she appears to indicate a disagreement on an important decision with what her family is suggesting that should be investigated much more closely and should be assumed that she has some capacity to make a decision.  I explained to the patient that with depression is frequently a good idea to treat for depression even if we are not absolutely certain  that a person has major depression if we think that it will improve their chances to recovery in a medical situation. Explained to her how important it is to keep her motivation and energy level up and her optimism. I asked her whether if I could hypothetically promise her that a medication would have no side effects and that it might have a 50% chance of helping her to feel better whether she would agree to take it. She nodded yes in answer to that. I described to her my suggestion that we start mirtazapine 7.5 mg SolTab at night. Describes some possible side effects. Described the possible benefit of trying to get onto a reasonable dose of antidepressant. Patient indicated she would be agreeable.  Discussed this after words with the nurse on duty. I am going to order 7.5 mg of mirtazapine SolTab which can be placed orally even in her current situation and she should be able to absorb the medication thus preventing her from needing to have a bolus push down the feeding tube. I will continue to follow-up.  Diagnosis depression NOS rule out adjustment disorder with depressed mood versus major depression

## 2015-11-03 NOTE — Progress Notes (Signed)
Subjective:  Patient remains critically ill at this point in time. She had hemodialysis yesterday. She remains on the ventilator with FiO2 of 30%. Remains on tube feeds and free water flush.   Objective:  Vital signs in last 24 hours:  Temp:  [97.7 F (36.5 C)-100.1 F (37.8 C)] 99.8 F (37.7 C) (12/15 0800) Pulse Rate:  [93-103] 103 (12/15 1000) Resp:  [0-37] 37 (12/15 1000) BP: (92-140)/(29-92) 118/50 mmHg (12/15 1000) SpO2:  [94 %-100 %] 100 % (12/15 1000) FiO2 (%):  [30 %] 30 % (12/15 0800)  Weight change:  Filed Weights    Intake/Output: I/O last 3 completed shifts: In: 1280 [I.V.:10; NG/GT:1270] Out: 1000 [Other:1000]   Intake/Output this shift:  Total I/O In: 170 [NG/GT:170] Out: -   Physical Exam: General: critically ill appearing  Head/ENT:  OM moist, NG tube  Eyes: anicteric  Neck: Tracheostomy in place  Lungs:  Scattered rhonchi, vent assisted  fio2 30%  Heart: S1S2 no rubs  Abdomen:  Soft, nontender, BS present  Extremities: 2+ dependent edema, anasarca  Neurologic: Lethargic but arousable today, not following commands  Access:  R IJ permcath.       Basic Metabolic Panel:  Recent Labs Lab 10/28/15 1050 10/30/15 1213 10/31/15 0448 11/01/15 1109  NA 140  --  144 147*  K 5.8* 5.6* 6.0* 5.2*  CL 97*  --  101 102  CO2 33*  --  34* 35*  GLUCOSE 152*  --  109* 126*  BUN 72*  --  92* 60*  CREATININE 1.25*  --  1.43* 1.11*  CALCIUM 8.7*  --  8.7* 8.8*  MG  --   --   --  2.1  PHOS 4.7*  --  5.2* 4.3    Liver Function Tests:  Recent Labs Lab 10/28/15 1050 10/31/15 0448  ALBUMIN 2.1* 2.0*   No results for input(s): LIPASE, AMYLASE in the last 168 hours. No results for input(s): AMMONIA in the last 168 hours.  CBC:  Recent Labs Lab 10/28/15 1050 10/30/15 0500 10/31/15 0448 11/01/15 1109  WBC 8.2 8.1 9.4 11.9*  HGB 7.9* 7.7* 7.7* 8.0*  HCT 25.1* 24.6* 24.4* 26.4*  MCV 96.0 97.4 95.9 97.4  PLT 248 249 257 250    Cardiac  Enzymes: No results for input(s): CKTOTAL, CKMB, CKMBINDEX, TROPONINI in the last 168 hours.  BNP: Invalid input(s): POCBNP  CBG:  Recent Labs Lab 11/02/15 1949 11/02/15 2341 11/03/15 0400 11/03/15 0557 11/03/15 0724  GLUCAP 127* 151* 116* 90 75    Microbiology: Results for orders placed or performed during the hospital encounter of 08-31-15  Blood Culture (routine x 2)     Status: None   Collection Time: 08/31/2015  8:51 AM  Result Value Ref Range Status   Specimen Description BLOOD [name]  Final   Special Requests BOTTLES DRAWN AEROBIC AND ANAEROBIC  3CC  Final   Culture NO GROWTH 5 DAYS  Final   Report Status 08/17/2015 FINAL  Final  Blood Culture (routine x 2)     Status: None   Collection Time: 08-31-15  9:20 AM  Result Value Ref Range Status   Specimen Description BLOOD LEFT ARM  Final   Special Requests BOTTLES DRAWN AEROBIC AND ANAEROBIC  1CC  Final   Culture NO GROWTH 5 DAYS  Final   Report Status 08/17/2015 FINAL  Final  Wound culture     Status: None   Collection Time: 08-31-2015  9:20 AM  Result Value Ref Range Status  Specimen Description DECUBITIS  Final   Special Requests Normal  Final   Gram Stain   Final    FEW WBC SEEN MANY GRAM NEGATIVE RODS RARE GRAM POSITIVE COCCI    Culture   Final    HEAVY GROWTH ESCHERICHIA COLI MODERATE GROWTH ENTEROBACTER AEROGENES PROTEUS MIRABILIS HEAVY GROWTH ENTEROCOCCUS SPECIES VRE HAVE INTRINSIC RESISTANCE TO MOST COMMONLY USED ANTIBIOTICS AND THE ABILITY TO ACQUIRE RESISTANCE TO MOST AVAILABLE ANTIBIOTICS.    Report Status 08/16/2015 FINAL  Final   Organism ID, Bacteria ESCHERICHIA COLI  Final   Organism ID, Bacteria ENTEROBACTER AEROGENES  Final   Organism ID, Bacteria PROTEUS MIRABILIS  Final   Organism ID, Bacteria ENTEROCOCCUS SPECIES  Final      Susceptibility   Enterobacter aerogenes - MIC*    CEFTAZIDIME <=1 SENSITIVE Sensitive     CEFAZOLIN >=64 RESISTANT Resistant     CEFTRIAXONE <=1 SENSITIVE Sensitive      CIPROFLOXACIN <=0.25 SENSITIVE Sensitive     GENTAMICIN <=1 SENSITIVE Sensitive     IMIPENEM 1 SENSITIVE Sensitive     TRIMETH/SULFA <=20 SENSITIVE Sensitive     * MODERATE GROWTH ENTEROBACTER AEROGENES   Escherichia coli - MIC*    AMPICILLIN <=2 SENSITIVE Sensitive     CEFTAZIDIME <=1 SENSITIVE Sensitive     CEFAZOLIN <=4 SENSITIVE Sensitive     CEFTRIAXONE <=1 SENSITIVE Sensitive     CIPROFLOXACIN <=0.25 SENSITIVE Sensitive     GENTAMICIN <=1 SENSITIVE Sensitive     IMIPENEM <=0.25 SENSITIVE Sensitive     TRIMETH/SULFA <=20 SENSITIVE Sensitive     * HEAVY GROWTH ESCHERICHIA COLI   Proteus mirabilis - MIC*    AMPICILLIN >=32 RESISTANT Resistant     CEFTAZIDIME <=1 SENSITIVE Sensitive     CEFAZOLIN 8 SENSITIVE Sensitive     CEFTRIAXONE <=1 SENSITIVE Sensitive     CIPROFLOXACIN <=0.25 SENSITIVE Sensitive     GENTAMICIN <=1 SENSITIVE Sensitive     IMIPENEM 1 SENSITIVE Sensitive     TRIMETH/SULFA <=20 SENSITIVE Sensitive     * PROTEUS MIRABILIS   Enterococcus species - MIC*    AMPICILLIN >=32 RESISTANT Resistant     VANCOMYCIN >=32 RESISTANT Resistant     GENTAMICIN SYNERGY SENSITIVE Sensitive     TETRACYCLINE Value in next row Resistant      RESISTANT>=16    * HEAVY GROWTH ENTEROCOCCUS SPECIES  MRSA PCR Screening     Status: None   Collection Time: Aug 18, 2015  2:38 PM  Result Value Ref Range Status   MRSA by PCR NEGATIVE NEGATIVE Final    Comment:        The GeneXpert MRSA Assay (FDA approved for NASAL specimens only), is one component of a comprehensive MRSA colonization surveillance program. It is not intended to diagnose MRSA infection nor to guide or monitor treatment for MRSA infections.   Blood culture (single)     Status: None   Collection Time: 08/18/15  3:36 PM  Result Value Ref Range Status   Specimen Description BLOOD RIGHT ASSIST CONTROL  Final   Special Requests BOTTLES DRAWN AEROBIC AND ANAEROBIC  Final   Culture NO GROWTH 5 DAYS  Final   Report  Status 08/17/2015 FINAL  Final  Wound culture     Status: None   Collection Time: 08/13/15 12:37 PM  Result Value Ref Range Status   Specimen Description WOUND  Final   Special Requests Normal  Final   Gram Stain   Final    FEW WBC SEEN TOO  NUMEROUS TO COUNT GRAM NEGATIVE RODS FEW GRAM POSITIVE COCCI    Culture   Final    HEAVY GROWTH ESCHERICHIA COLI MODERATE GROWTH PROTEUS MIRABILIS LIGHT GROWTH KLEBSIELLA PNEUMONIAE MODERATE GROWTH ENTEROCOCCUS GALLINARUM CRITICAL RESULT CALLED TO, READ BACK BY AND VERIFIED WITH: [facility] BORBA AT 1042 08/16/15 DV    Report Status 08/17/2015 FINAL  Final   Organism ID, Bacteria ESCHERICHIA COLI  Final   Organism ID, Bacteria PROTEUS MIRABILIS  Final   Organism ID, Bacteria KLEBSIELLA PNEUMONIAE  Final   Organism ID, Bacteria ENTEROCOCCUS GALLINARUM  Final      Susceptibility   Escherichia coli - MIC*    AMPICILLIN >=32 RESISTANT Resistant     CEFTAZIDIME 4 RESISTANT Resistant     CEFAZOLIN >=64 RESISTANT Resistant     CEFTRIAXONE 16 RESISTANT Resistant     GENTAMICIN 2 SENSITIVE Sensitive     IMIPENEM >=16 RESISTANT Resistant     TRIMETH/SULFA <=20 SENSITIVE Sensitive     Extended ESBL POSITIVE Resistant     PIP/TAZO Value in next row Resistant      RESISTANT>=128    CIPROFLOXACIN Value in next row Sensitive      SENSITIVE<=0.25    * HEAVY GROWTH ESCHERICHIA COLI   Klebsiella pneumoniae - MIC*    AMPICILLIN Value in next row Resistant      SENSITIVE<=0.25    CEFTAZIDIME Value in next row Resistant      SENSITIVE<=0.25    CEFAZOLIN Value in next row Resistant      SENSITIVE<=0.25    CEFTRIAXONE Value in next row Resistant      SENSITIVE<=0.25    CIPROFLOXACIN Value in next row Resistant      SENSITIVE<=0.25    GENTAMICIN Value in next row Sensitive      SENSITIVE<=0.25    IMIPENEM Value in next row Resistant      SENSITIVE<=0.25    TRIMETH/SULFA Value in next row Resistant      SENSITIVE<=0.25    PIP/TAZO Value in next row  Resistant      RESISTANT>=128    * LIGHT GROWTH KLEBSIELLA PNEUMONIAE   Proteus mirabilis - MIC*    AMPICILLIN Value in next row Resistant      RESISTANT>=128    CEFTAZIDIME Value in next row Sensitive      RESISTANT>=128    CEFAZOLIN Value in next row Sensitive      RESISTANT>=128    CEFTRIAXONE Value in next row Sensitive      RESISTANT>=128    CIPROFLOXACIN Value in next row Sensitive      RESISTANT>=128    GENTAMICIN Value in next row Sensitive      RESISTANT>=128    IMIPENEM Value in next row Sensitive      RESISTANT>=128    TRIMETH/SULFA Value in next row Sensitive      RESISTANT>=128    PIP/TAZO Value in next row Sensitive      SENSITIVE<=4    * MODERATE GROWTH PROTEUS MIRABILIS   Enterococcus gallinarum - MIC*    AMPICILLIN Value in next row Resistant      SENSITIVE<=4    GENTAMICIN SYNERGY Value in next row Sensitive      SENSITIVE<=4    CIPROFLOXACIN Value in next row Resistant      RESISTANT>=8    TETRACYCLINE Value in next row Resistant      RESISTANT>=16    * MODERATE GROWTH ENTEROCOCCUS GALLINARUM  Wound culture     Status: None   Collection Time: 08/15/15  2:53 PM  Result Value Ref Range Status   Specimen Description WOUND  Final   Special Requests NONE  Final   Gram Stain FEW WBC SEEN NO ORGANISMS SEEN   Final   Culture NO GROWTH 3 DAYS  Final   Report Status 08/18/2015 FINAL  Final  C difficile quick scan w PCR reflex     Status: None   Collection Time: 08/17/15 11:34 AM  Result Value Ref Range Status   C Diff antigen NEGATIVE NEGATIVE Final   C Diff toxin NEGATIVE NEGATIVE Final   C Diff interpretation Negative for C. difficile  Final  Stool culture     Status: None   Collection Time: 09/03/15  3:51 PM  Result Value Ref Range Status   Specimen Description STOOL  Final   Special Requests Immunocompromised  Final   Culture   Final    NO SALMONELLA OR SHIGELLA ISOLATED No Pathogenic E. coli detected NO CAMPYLOBACTER DETECTED    Report Status  09/07/2015 FINAL  Final  C difficile quick scan w PCR reflex     Status: None   Collection Time: 09/13/15 12:51 AM  Result Value Ref Range Status   C Diff antigen NEGATIVE NEGATIVE Final   C Diff toxin NEGATIVE NEGATIVE Final   C Diff interpretation Negative for C. difficile  Final  Culture, blood (routine x 2)     Status: None   Collection Time: 09/16/15 11:24 AM  Result Value Ref Range Status   Specimen Description BLOOD LEFT HAND  Final   Special Requests BOTTLES DRAWN AEROBIC AND ANAEROBIC  1CC  Final   Culture NO GROWTH 5 DAYS  Final   Report Status 09/21/2015 FINAL  Final  Culture, blood (routine x 2)     Status: None   Collection Time: 09/16/15 12:23 PM  Result Value Ref Range Status   Specimen Description BLOOD RIGHT HAND  Final   Special Requests BOTTLES DRAWN AEROBIC AND ANAEROBIC  1CC  Final   Culture  Setup Time   Final    GRAM POSITIVE COCCI AEROBIC BOTTLE ONLY CRITICAL RESULT CALLED TO, READ BACK BY AND VERIFIED WITH: TESS THOMAS,RN 09/17/2015 0631 BY JRS.    Culture   Final    ENTEROCOCCUS FAECALIS AEROBIC BOTTLE ONLY VRE HAVE INTRINSIC RESISTANCE TO MOST COMMONLY USED ANTIBIOTICS AND THE ABILITY TO ACQUIRE RESISTANCE TO MOST AVAILABLE ANTIBIOTICS. CRITICAL RESULT CALLED TO, READ BACK BY AND VERIFIED WITH: CHERYL SMITH AT 1610 09/19/15 DV    Report Status 09/21/2015 FINAL  Final   Organism ID, Bacteria ENTEROCOCCUS FAECALIS  Final      Susceptibility   Enterococcus faecalis - MIC*    AMPICILLIN <=2 SENSITIVE Sensitive     LINEZOLID 2 SENSITIVE Sensitive     CIPROFLOXACIN Value in next row Resistant      RESISTANT>=8    TETRACYCLINE Value in next row Resistant      RESISTANT>=16    VANCOMYCIN Value in next row Resistant      RESISTANT>=32    GENTAMICIN SYNERGY Value in next row Resistant      RESISTANT>=32    * ENTEROCOCCUS FAECALIS  Culture, respiratory (NON-Expectorated)     Status: None   Collection Time: 09/16/15  3:50 PM  Result Value Ref Range Status    Specimen Description TRACHEAL ASPIRATE  Final   Special Requests Immunocompromised  Final   Gram Stain   Final    FEW WBC SEEN GOOD SPECIMEN - 80-90% WBCS RARE GRAM NEGATIVE RODS  Culture LIGHT GROWTH PSEUDOMONAS AERUGINOSA  Final   Report Status 09/19/2015 FINAL  Final   Organism ID, Bacteria PSEUDOMONAS AERUGINOSA  Final      Susceptibility   Pseudomonas aeruginosa - MIC*    CEFTAZIDIME 8 SENSITIVE Sensitive     CIPROFLOXACIN 2 INTERMEDIATE Intermediate     GENTAMICIN >=16 RESISTANT Resistant     IMIPENEM >=16 RESISTANT Resistant     PIP/TAZO Value in next row Sensitive      SENSITIVE32    CEFEPIME Value in next row Sensitive      SENSITIVE8    LEVOFLOXACIN Value in next row Resistant      RESISTANT>=8    * LIGHT GROWTH PSEUDOMONAS AERUGINOSA  Culture, expectorated sputum-assessment     Status: None   Collection Time: 09/22/15  2:09 PM  Result Value Ref Range Status   Specimen Description ENDOTRACHEAL  Final   Special Requests Normal  Final   Sputum evaluation THIS SPECIMEN IS ACCEPTABLE FOR SPUTUM CULTURE  Final   Report Status 09/24/2015 FINAL  Final  Culture, respiratory (NON-Expectorated)     Status: None   Collection Time: 09/22/15  2:09 PM  Result Value Ref Range Status   Specimen Description ENDOTRACHEAL  Final   Special Requests Normal Reflexed from [id number]  Final   Gram Stain   Final    FEW WBC SEEN FEW GRAM NEGATIVE RODS POOR SPECIMEN - LESS THAN 70% WBCS    Culture   Final    MODERATE GROWTH PSEUDOMONAS AERUGINOSA LIGHT GROWTH KLEBSIELLA PNEUMONIAE REFER TO SENSITIVITIES FROM PREVIOUS CULTURE FOR ORG 2    Report Status 10/01/2015 FINAL  Final   Organism ID, Bacteria PSEUDOMONAS AERUGINOSA  Final   Organism ID, Bacteria KLEBSIELLA PNEUMONIAE  Final      Susceptibility   Pseudomonas aeruginosa - MIC*    CEFTAZIDIME 4 SENSITIVE Sensitive     CIPROFLOXACIN 2 INTERMEDIATE Intermediate     GENTAMICIN >=16 RESISTANT Resistant     IMIPENEM >=16 RESISTANT  Resistant     PIP/TAZO Value in next row Sensitive      SENSITIVE16    * MODERATE GROWTH PSEUDOMONAS AERUGINOSA  Tissue culture     Status: None   Collection Time: 09/24/15  6:44 AM  Result Value Ref Range Status   Specimen Description BONE  Final   Special Requests Normal  Final   Gram Stain MODERATE WBC SEEN FEW GRAM NEGATIVE RODS   Final   Culture   Final    MODERATE GROWTH ESCHERICHIA COLI MODERATE GROWTH KLEBSIELLA PNEUMONIAE ESBL-EXTENDED SPECTRUM BETA LACTAMASE-THE ORGANISM IS RESISTANT TO PENICILLINS, CEPHALOSPORINS AND AZTREONAM ACCORDING TO CLSI M100-S15 VOL.25 N01 JAN 2005. ORGANISM 1 This organism isolate is resistant to one or more antiotic agents in three or more antimicrobial categories.  Suggest Infectious Disease consult.   ORGANISM 2 CRITICAL RESULT CALLED TO, READ BACK BY AND VERIFIED WITH: RN Mardene CelesteJOANNA LINDSAY 09/27/15 1005AM    Report Status 09/28/2015 FINAL  Final   Organism ID, Bacteria ESCHERICHIA COLI  Final   Organism ID, Bacteria KLEBSIELLA PNEUMONIAE  Final      Susceptibility   Escherichia coli - MIC*    AMPICILLIN >=32 RESISTANT Resistant     CEFTAZIDIME 4 RESISTANT Resistant     CEFAZOLIN >=64 RESISTANT Resistant     CEFTRIAXONE 8 RESISTANT Resistant     GENTAMICIN <=1 SENSITIVE Sensitive     IMIPENEM 8 RESISTANT Resistant     TRIMETH/SULFA <=20 SENSITIVE Sensitive     Extended ESBL POSITIVE Resistant  PIP/TAZO Value in next row Resistant      RESISTANT>=128    * MODERATE GROWTH ESCHERICHIA COLI   Klebsiella pneumoniae - MIC*    AMPICILLIN Value in next row Resistant      RESISTANT>=128    CEFTAZIDIME Value in next row Resistant      RESISTANT>=128    CEFAZOLIN Value in next row Resistant      RESISTANT>=128    CEFTRIAXONE Value in next row Resistant      RESISTANT>=128    CIPROFLOXACIN Value in next row Resistant      RESISTANT>=128    GENTAMICIN Value in next row Resistant      RESISTANT>=128    IMIPENEM Value in next row Resistant       RESISTANT>=128    TRIMETH/SULFA Value in next row Sensitive      RESISTANT>=128    PIP/TAZO Value in next row Resistant      RESISTANT>=128    * MODERATE GROWTH KLEBSIELLA PNEUMONIAE  Anaerobic culture     Status: None   Collection Time: 09/24/15  3:55 PM  Result Value Ref Range Status   Specimen Description BONE  Final   Special Requests Normal  Final   Culture NO ANAEROBES ISOLATED  Final   Report Status 09/29/2015 FINAL  Final  Culture, fungus without smear     Status: None   Collection Time: 09/24/15  3:55 PM  Result Value Ref Range Status   Specimen Description BONE  Final   Special Requests Normal  Final   Culture NO FUNGUS ISOLATED AFTER 24 DAYS  Final   Report Status 10/18/2015 FINAL  Final  C difficile quick scan w PCR reflex     Status: None   Collection Time: 10/07/15  2:02 PM  Result Value Ref Range Status   C Diff antigen NEGATIVE NEGATIVE Final   C Diff toxin NEGATIVE NEGATIVE Final   C Diff interpretation Negative for C. difficile  Final  Culture, blood (routine x 2)     Status: None   Collection Time: 10/16/15  9:52 AM  Result Value Ref Range Status   Specimen Description BLOOD LEFT HAND  Final   Special Requests   Final    BOTTLES DRAWN AEROBIC AND ANAEROBIC  AER 6CC ANA 4CC   Culture NO GROWTH 6 DAYS  Final   Report Status 10/22/2015 FINAL  Final  Culture, blood (routine x 2)     Status: None   Collection Time: 10/16/15 12:25 PM  Result Value Ref Range Status   Specimen Description BLOOD LEFT HAND  Final   Special Requests BOTTLES DRAWN AEROBIC AND ANAEROBIC  5CC  Final   Culture NO GROWTH 6 DAYS  Final   Report Status 10/22/2015 FINAL  Final  Culture, expectorated sputum-assessment     Status: None   Collection Time: 10/18/15  1:15 PM  Result Value Ref Range Status   Specimen Description SPUTUM  Final   Special Requests NONE  Final   Sputum evaluation THIS SPECIMEN IS ACCEPTABLE FOR SPUTUM CULTURE  Final   Report Status 10/18/2015 FINAL  Final   Culture, respiratory (NON-Expectorated)     Status: None   Collection Time: 10/18/15  1:15 PM  Result Value Ref Range Status   Specimen Description SPUTUM  Final   Special Requests NONE Reflexed from [id number]  Final   Gram Stain   Final    GOOD SPECIMEN - 80-90% WBCS MODERATE WBC SEEN MANY GRAM NEGATIVE RODS    Culture  Final    HEAVY GROWTH PSEUDOMONAS AERUGINOSA MODERATE GROWTH KLEBSIELLA PNEUMONIAE CRITICAL RESULT CALLED TO, READ BACK BY AND VERIFIED WITHRod Mae AT 6045 10/21/15 DV KLEBSIELLA PNEUMONIAE This organism isolate is resistant to one or more antiotic agents in three or more antimicrobial categories.  Suggest Infectious Disease  consult.      Report Status 10/22/2015 FINAL  Final   Organism ID, Bacteria PSEUDOMONAS AERUGINOSA  Final   Organism ID, Bacteria KLEBSIELLA PNEUMONIAE  Final      Susceptibility   Klebsiella pneumoniae - MIC*    AMPICILLIN >=32 RESISTANT Resistant     CEFAZOLIN >=64 RESISTANT Resistant     CEFTRIAXONE >=64 RESISTANT Resistant     CIPROFLOXACIN >=4 RESISTANT Resistant     GENTAMICIN >=16 RESISTANT Resistant     IMIPENEM >=16 RESISTANT Resistant     NITROFURANTOIN >=512 RESISTANT Resistant     TRIMETH/SULFA <=20 SENSITIVE Sensitive     * MODERATE GROWTH KLEBSIELLA PNEUMONIAE   Pseudomonas aeruginosa - MIC*    CEFTAZIDIME 4 SENSITIVE Sensitive     CIPROFLOXACIN >=4 RESISTANT Resistant     GENTAMICIN 8 INTERMEDIATE Intermediate     IMIPENEM >=16 RESISTANT Resistant     PIP/TAZO Value in next row Sensitive      SENSITIVE32    * HEAVY GROWTH PSEUDOMONAS AERUGINOSA  C difficile quick scan w PCR reflex     Status: None   Collection Time: 10/18/15  4:39 PM  Result Value Ref Range Status   C Diff antigen NEGATIVE NEGATIVE Final   C Diff toxin NEGATIVE NEGATIVE Final   C Diff interpretation Negative for C. difficile  Final    Coagulation Studies: No results for input(s): LABPROT, INR in the last 72 hours.  Urinalysis: No results  for input(s): COLORURINE, LABSPEC, PHURINE, GLUCOSEU, HGBUR, BILIRUBINUR, KETONESUR, PROTEINUR, UROBILINOGEN, NITRITE, LEUKOCYTESUR in the last 72 hours.  Invalid input(s): APPERANCEUR    Imaging: No results found.   Medications:   . feeding supplement (JEVITY 1.5 CAL/FIBER) 1,000 mL (11/02/15 2333)  . norepinephrine Stopped (10/08/15 0530)   . amantadine  200 mg Per Tube Q7 days  . antiseptic oral rinse  7 mL Mouth Rinse QID  . aspirin  81 mg Oral Daily  . chlorhexidine gluconate  15 mL Mouth Rinse BID  . epoetin (EPOGEN/PROCRIT) injection  10,000 Units Intravenous Q M,W,F-HD  . escitalopram  10 mg Oral Daily  . feeding supplement (PRO-STAT SUGAR FREE 64)  30 mL Oral 6 X Daily  . folic acid  1 mg Oral Daily  . free water  20 mL Per Tube 6 times per day  . heparin subcutaneous  5,000 Units Subcutaneous Q12H  . hydrocortisone  10 mg Per Tube BID  . insulin aspart  0-9 Units Subcutaneous 6 times per day  . lidocaine  1 patch Transdermal Q24H  . loperamide  2 mg Oral BID  . midodrine  5 mg Per Tube TID WC  . multivitamin  5 mL Oral Daily  . pantoprazole sodium  40 mg Per Tube Daily  . saccharomyces boulardii  250 mg Oral BID  . sodium chloride  10-40 mL Intracatheter Q12H  . sodium hypochlorite   Irrigation BID  . sulfamethoxazole-trimethoprim  2.5 tablet Per Tube q1800  . thiamine  100 mg Per Tube Daily   sodium chloride, sodium chloride, acetaminophen (TYLENOL) oral liquid 160 mg/5 mL, HYDROmorphone (DILAUDID) injection, ipratropium-albuterol, morphine injection, ondansetron (ZOFRAN) IV, oxyCODONE, sodium chloride, zinc oxide  Assessment/ Plan:  49 y.o.  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 Admitted on 08/27/2015  1. End-stage renal disease on hemodialysis on HD MWF. The patient has been on dialysis since October of 2014. R IJ permcath. Dialysis treatment with IV albumin for oncotic support. Midodrine and hydrocortisone ordered for blood pressure support.  - Patient had hemodialysis yesterday. No acute indication for dialysis today. We will plan for hemodialysis again tomorrow with conservative ultrafiltration target's as she's had hypotension which limits significant ultrafiltration.  2. Anemia of CKD. Hemoglobin at last check was 8.0. Continue to monitor CBC periodically. We will maintain the patient on Epogen 10,000 units IV with dialysis.   3. Secondary hyperparathyroidism: PTH 50, phos normal, continue to montior bone mineral metabolism parameters.   4. Sepsis: VRE in blood. Bone cultures E. Coli and klebsiella - Treatment as per ID and ICU team: daptomycin completed on 11/10 and now on high dose trimethoprim/sulfamethoxazole.   5. Acute resp failure:  -Remains on the ventilator at this time. FiO2 remained stable at 30%. Pulmicort critical care following.   6. Generalized edema - Continue gentle ultrafiltration with dialysis as above. Patient requires low blood flow rates, albumin, and midodrine.  7.  Hyperkalemia:  Continue 2 potassium bath.   LOS: 83 Suni Jarnagin 12/15/201610:59 AM

## 2015-11-03 NOTE — Progress Notes (Signed)
PT Cancellation Note  Patient Details Name: Courtney GrinderHedda McMillian Monroe MRN: [id number] DOB: [birth year]   Cancelled Treatment:    Reason Eval/Treat Not Completed: Other (comment) (Treatment session attempted.  Patient repeatedly pulling L UE away from therapist, resisting movement attempts and adamantly shaking head "no" to therapist efforts.  Clearly refusing participation at this time.  Will re-attempt at later time/date as medically appropriate and patient agreeable.)   Antha Niday H. Manson PasseyBrown, PT, DPT, NCS 11/03/2015, 2:55 PM [phone]

## 2015-11-03 NOTE — Progress Notes (Signed)
Patient continues on ventilator on via 6shiley trach with minimal secretion via suction.  Patient able to nod and shake head to communicate at times, follows commands.  On no sedation.  PRN pain meds adminstered x1 for signs of pain and X1 for dressing change with improvement.  Vital signs stable, sinus rythm on cardiac monitor.  Tube feeding continues with no difficulty.  Patient is anuric.  Loose bm x1 this shift.  Patient had bilateral ultrasound of lower extremities today. Patient currently resting in no apparent distress.  See [facility] for further details.

## 2015-11-03 NOTE — Progress Notes (Signed)
[facility]  Name: Courtney GrinderHedda McMillian [name] MRN: [id number] DOB: 1966/07/22    ADMISSION DATE:  Aug 31, 2015   INITIAL PRESENTATION:  49 AAF who has been in medical facilities (hosp, LTAC, rehab) for 2 yrs following gastric bypass surgery with multiple complications. Now with chronic trach, ESRD, profound debilitation, severe sacral pressure ulcer. Was seemingly making progress and transferred to rehab facility approx one week prior to this admission. [facility] with AMS and hypotension. Working dx of severe sepsis/septic shock due to infected sacral pressure ulcer. Since admission. Her course is been very complicated with numerous complications including septic shock and GI bleeding. Now with failure to wean from vent    INTERVAL HISTORY: alert, noted to have some mild tachypnea when placed on lower settings of pressure support. Placed back on the vent for the majority of the day and night. We will attempt pressure support again today,plan for HD today Regarding PEG tube-husband states that she does have a complex GI anatomy and the obstacle in the past was finding a surgeon suitable to place an NG tube,    difficulty weaning from vent, placed on PRVC mode due to WOB will attempt to wean as tolerated HD as needed per nephrology Will obtain US Lower ext to assess for DVT  Summary of MAJOR EVENTS/TEST RESULTS: Admission 02/07/14-05/07/14 Admission 07/21/14-09/06/14 Discharged to [facility]. Pt had palliative consult at that time, were asked to sign off by husband.  09/23 CT head: NAD 09/23 EEG: no epileptiform activity 09/23 PRBCs for Hgb 6.4 09/24 bedside debridement of sacral wound. Abscess drained 09/25 Off vasopressors. More alert. No distress. Worsening thrombocytopenia. Vanc DC'd 09/29  Dr. [name] (I.D) excused from the case by patient's husband. 10/02 MRI -multiple infarcts 10/03 tracheal bleeding- transfused platelets 10/03  hospitalist service excused from the case by patient's husband 10/03 Echocardiogram ejection fraction was 55-60%, pulmonary systolic pressure was 39 mmHg 40/9810/08 restart TF's at lower rate, attempt reg HD  10/12 Transferred to med-surg floor. Remains on PCCM service 10/14 SLP eval: pt unable to tolerate PMV adequately 10/17-will re-attempt PM valve-discussed with Speech therapist 10/18 passed swallow eval-start pureed thick foods no thin liquids-continue NG feeds 10/19 transferred to step down for sepsis/aspiration pneumonia 10/19 cxr shows RLL opacity 10/21 sacral decub debride at bedside by surgery 10/22 started back on vasopressors while on HD 10/27 CT with osteo, R hip fx, unable to identify tip of dubhoff tube - sent for fluoro study 10/28 Ortho consultation: I do not feel that she is a surgical candidate. Therefore, I feel that it would best to manage this fracture nonsurgically and allow it to heal by itself over time, which it should.  10/28 Gen Surg consultation: Due to the lack of free air or free flow of contrast and the peritoneum there is no indication for any surgical intervention on this. Would recommend pulling back the feeding tube 1-2 cm. Would recheck an abdominal film to confirm no pneumoperitoneum in the morning. Absence of any changes okay to continue using Dobbhoff for feeding and medications. 10/28 gastrograffin study: The study confirms that the feeding tube pes perforated through the duodenum and the tip is within a cavity that fills with injected contrast. The cavity does appear walled off 11/4 refuses oral feeds, continue TF's CT reviewed: T8-T9 discitis/osteomyelitis, RT HIP FRACTURE 11/5 placed back on vasopressors, placed back on Vent due to resp acidosis, levophed turned off 11/6 afternoon 11/5 PM Trach changed out due to cuff  leak, #6 Shiley cuffed, 11/6 dubhoff occluded, removed and replaced, new dubhoff shows tube in the antral stomach 11/15 refusing to take oral  feeds 11/18 placed back on Vent for increased WOB,SOB. 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 10/18/2015; the patient developed a new left basal pneumonia; with recurrent sepsis.  10/19/2015; fevers resolved with IV acetaminophen. Tube feeds changed from vital to Jevity.  12/7 CXR with stable b/l opacities.  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 Pneumoniae, P. Mirabilis, VRE Wound 9/26 >> No growth Blood 9/23 >> NEG CDiff 9/27>>neg Stool Cx 10/15>> negative Cdiff 10/25>>neg Trach Aspirate 10/28>> light growth pseudomonas Blood 10/28 >> 1/2 GPC >>  Sputum cultures obtained 09/22/15 due to mucus plugging>>Pseudomonas BONE TISSUE Cx 11/3>>E.coli (ESBL), k. Pneumonia (daptomycin and septra) Bld Cx 11/27>>negative thus far.  Trach Asp 11/27>> grew out Pseudomonas, and Klebsiella pneumonia.  ANTIMICROBIALS:  Aztreonam 9/23 >> 9/24 Vanc 9/23 >> 9/25 Vanc 9/26>>9/27 Daptomycin 9/27>> 10/12, 10/28>> off Meropenem 9/23 >> 10/14, 10/28 >> 10/31 Septra 11/8>> Zosyn 11/27,>> 11/30. Levaquin 11/29>> 11/29. Ceftaz 12/1>>  VITAL SIGNS: Temp:  [97.7 F (36.5 C)-100.1 F (37.8 C)] 99.8 F (37.7 C) (12/15 0800) Pulse Rate:  [93-103] 103 (12/15 1000) Resp:  [0-37] 37 (12/15 1000) BP: (92-140)/(29-92) 118/50 mmHg (12/15 1000) SpO2:  [94 %-100 %] 100 % (12/15 1000) FiO2 (%):  [30 %] 30 % (12/15 0800) HEMODYNAMICS:   VENTILATOR SETTINGS: Vent Mode:  [-] PRVC FiO2 (%):  [30 %] 30 % Set Rate:  [14 bmp] 14  bmp Vt Set:  [530 mL] 530 mL PEEP:  [5 cmH20] 5 cmH20 INTAKE / OUTPUT:  Intake/Output Summary (Last 24 hours) at 11/03/15 1116 Last data filed at 11/03/15 1000  Gross per 24 hour  Intake    920 ml  Output   1000 ml  Net    -80 ml   PHYSICAL EXAMINATION: Physical Examination:   VS: BP 118/50 mmHg  Pulse 103  Temp(Src) 99.8 F (37.7 C) (Oral)  Resp 37  Ht  (1.753 m)  Wt 229 lb 4.5 oz (104 kg)  BMI 33.84 kg/m2  SpO2 100%  LMP  (LMP Unknown)  General Appearance: No resp distress, coarse upper airway sounds, on vent Neuro: profoundly diffusely weak, alert today, sleeping but easily arousable HEENT: cushingoid facies, PERRLA, EOM intact Neck: trach site clean Pulmonary: clear anteriorly Cardiovascular: reg, + syst murmur Abdomen: soft, NT, +BS Extremities: warm, R>L UE edema +sacral wound    LABORATORY PANEL:   CBC  Recent Labs Lab 11/01/15 1109  WBC 11.9*  HGB 8.0*  HCT 26.4*  PLT 250    Chemistries   Recent Labs Lab 11/01/15 1109  NA 147*  K 5.2*  CL 102  CO2 35*  GLUCOSE 126*  BUN 60*  CREATININE 1.11*  CALCIUM 8.8*  MG 2.1  PHOS 4.3       IMPRESSION/PLAN (current major issues):  Patient with extreme pain, pain meds given.  Recurrent sepsis, due to pneumonia/sacral wound with chronic osteo of sacrum and  spine  Anemia with macrocytosis -Uncertain if this is due to chronic slow GI bleed or simple critical care associated anemia. She has already receiving folate, thiamine, and multivitamin. Her anemia has not responded to oral medications, this may be due to her GI malabsorption.   Pneumonia with sepsis-remains on vent support -Chest x-ray shows new left lower lobe infiltrate,which is now improving.  Sputum cultures show pseudomonas. The patient is once again, vent dependent. She has developed again recurrent sepsis.Marland Kitchen Sputum cultures showing Pseudomonas, and Klebsiella, currently covered with this. Ceftaz total 10 days, stop date  12/11 Weaning trial daily off MV - currently on PRVC mode-willattempt PSV trials with plan to transition back to TCT  Worsening sacral decubitus ulcer, stage IV. With spin involvement -The patient is already had a sacral bone scraping, and has been placed on Septra for antibiotic, per ID recommendations. Her sacral decubitus does not appear to be improving and in fact appears to be worsening. In addition, she has several new smaller areas of tissue breakdown.  End-stage renal disease. -Continue on hemodialysis per nephrology.  Diarrhea. -loperamide 2 twice daily scheduled, and changing her tube feeds back to Jevity.   Trach Care -changed on 11/5 to #6 Shiley due to cuff leak   Patient appears to be stable for transfer to an LTAC facility at this point in time.   Additional Chronic issues and complicating factors during this admission.  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  Thrombocytopenia, resolved - HIT panel negative 9/25 Recurrent Severe sepsis, due to sacral pressure ulcer DM 2, controlled.  Chronic steroid therapy, for a history of of adrenal insufficiency Incidental finding of R hip fx - conservative mgmt. Waxing and waning encephalopathy - she was evaluated by both the psychiatry and neurology services, and it was deemed that the patient lacks decisional capacity at this time. 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 Husband declined TEE Profound deconditioning-criticall illness neuropathy Chronic pain PNEUMONIA    The Patient requires high complexity decision making for assessment and support, frequent evaluation and titration of therapies, application of advanced monitoring technologies and extensive interpretation of multiple databases. Critical Care Time devoted to patient care services described in this note is 35 minutes.   Overall, patient is critically ill, prognosis is guarded.  Patient with  Multiorgan failure and at high risk for cardiac arrest and death.    Courtney [name], M.D.  [facility]  Medical Director [facility] Medical Director [facility] Cardio-Pulmonary Department

## 2015-11-04 ENCOUNTER — Inpatient Hospital Stay: Payer: 59

## 2015-11-04 LAB — BASIC METABOLIC PANEL
ANION GAP: 9 (ref 5–15)
BUN: 76 mg/dL — AB (ref 6–20)
CALCIUM: 8.5 mg/dL — AB (ref 8.9–10.3)
CO2: 33 mmol/L — AB (ref 22–32)
Chloride: 103 mmol/L (ref 101–111)
Creatinine, Ser: 1.31 mg/dL — ABNORMAL HIGH (ref 0.44–1.00)
GFR calc Af Amer: 54 mL/min — ABNORMAL LOW (ref >60)
GFR, EST NON AFRICAN AMERICAN: 47 mL/min — AB (ref >60)
GLUCOSE: 132 mg/dL — AB (ref 65–99)
Potassium: 5 mmol/L (ref 3.5–5.1)
Sodium: 145 mmol/L (ref 135–145)

## 2015-11-04 LAB — GLUCOSE, CAPILLARY
GLUCOSE-CAPILLARY: 101 mg/dL — AB (ref 65–99)
GLUCOSE-CAPILLARY: 119 mg/dL — AB (ref 65–99)
GLUCOSE-CAPILLARY: 123 mg/dL — AB (ref 65–99)
GLUCOSE-CAPILLARY: 141 mg/dL — AB (ref 65–99)
GLUCOSE-CAPILLARY: 62 mg/dL — AB (ref 65–99)
GLUCOSE-CAPILLARY: 69 mg/dL (ref 65–99)
GLUCOSE-CAPILLARY: 70 mg/dL (ref 65–99)
GLUCOSE-CAPILLARY: 85 mg/dL (ref 65–99)
GLUCOSE-CAPILLARY: 94 mg/dL (ref 65–99)
Glucose-Capillary: 103 mg/dL — ABNORMAL HIGH (ref 65–99)
Glucose-Capillary: 95 mg/dL (ref 65–99)

## 2015-11-04 LAB — PHOSPHORUS: Phosphorus: 4.5 mg/dL (ref 2.5–4.6)

## 2015-11-04 LAB — CBC
HCT: 24.1 % — ABNORMAL LOW (ref 35.0–47.0)
HEMOGLOBIN: 7.3 g/dL — AB (ref 12.0–16.0)
MCH: 29.7 pg (ref 26.0–34.0)
MCHC: 30.3 g/dL — ABNORMAL LOW (ref 32.0–36.0)
MCV: 98.2 fL (ref 80.0–100.0)
Platelets: 213 10*3/uL (ref 150–440)
RBC: 2.45 MIL/uL — AB (ref 3.80–5.20)
RDW: 18.9 % — ABNORMAL HIGH (ref 11.5–14.5)
WBC: 15.7 10*3/uL — AB (ref 3.6–11.0)

## 2015-11-04 LAB — MAGNESIUM: Magnesium: 2.1 mg/dL (ref 1.7–2.4)

## 2015-11-04 MED ORDER — ALBUMIN HUMAN 25 % IV SOLN
25.0000 g | Freq: Once | INTRAVENOUS | Status: AC
  Administered 2015-11-04: 25 g via INTRAVENOUS
  Filled 2015-11-04: qty 100

## 2015-11-04 MED ORDER — SODIUM CHLORIDE 0.9 % IV BOLUS (SEPSIS)
500.0000 mL | Freq: Once | INTRAVENOUS | Status: AC
  Administered 2015-11-04: 500 mL via INTRAVENOUS

## 2015-11-04 MED ORDER — DEXTROSE 50 % IV SOLN
INTRAVENOUS | Status: AC
  Administered 2015-11-04: 25 mL
  Filled 2015-11-04: qty 50

## 2015-11-04 MED ORDER — DEXTROSE 50 % IV SOLN
25.0000 mL | Freq: Once | INTRAVENOUS | Status: AC
  Administered 2015-11-04: 25 mL via INTRAVENOUS
  Filled 2015-11-04: qty 50

## 2015-11-04 NOTE — Progress Notes (Addendum)
When giving patient her medication through her Dubhopff, she vomited a moderate amount of thick yellow emesis. MD is aware. Feedings are being held at this time and an abdominal xray to verify tube placement has been ordered.   Update: Patient's husband called back and has been updated. He stated that he has spoken to the doctor and is aware of the tube feedings being held for 24 hours. He has no new questions or concerns at this time. Spoke with husband at [phone]

## 2015-11-04 NOTE — Progress Notes (Signed)
eLink Physician-Brief Progress Note Patient Name: Courtney [name] DOB: [birth year] MRN: [id number]   Date of Service  11/04/2015  HPI/Events of Note  Hypotension - SBP = 70's. Nurse to give Midadrine dose scheduled soon.  eICU Interventions  Will order: 1. Bolus with 0.9 NaCl 500 mL IV over 30 minutes now.     Intervention Category Intermediate Interventions: Hypotension - evaluation and management  Sommer,Steven Eugene 11/04/2015, 3:37 PM

## 2015-11-04 NOTE — Consult Note (Signed)
  Psychiatry: Follow-up for this 49 year old woman in the critical care unit multiple severe medical problems. Tried to interview her again today briefly but she was quite sedated and not able to provide any new information. Chart reviewed. I spoke with case management as well to continue to help to clarify for myself how I could be helpful. It sounds like the treatment team has ongoing concerns about the appropriateness of continued intensive treatment given the multiple severe medical problems that she has which do not seem to be progressing positively. From what I'm given to understand they would like to know what the patient truly wants as far as medical treatment but are not clear on that. Particularly there is concern about end-of-life decisions.  End-of-life decisions at this point would require going into some detail with her about the options. My suggestion would be that if possible there be a scheduled meeting with a physician, nursing and the patient's next of kin along with the patient to try and very concretely hammer out what the expectations are and get some idea about what the patient's wishes are. If I can be of some help with that I would be happy to try. Meanwhile I will be away from the hospital this weekend and next week. If interval psychiatric assistance is needed please contact psychiatrist on call for consult. No other change to treatment plan continue the current initiation of very low dose mirtazapine

## 2015-11-04 NOTE — Progress Notes (Signed)
Subjective:  No significant change in status at present. Patient due for hemodialysis today. She remains on the ventilator at this time.   Objective:  Vital signs in last 24 hours:  Temp:  [97.7 F (36.5 C)-100.1 F (37.8 C)] 98.6 F (37 C) (12/16 0942) Pulse Rate:  [87-103] 93 (12/16 0700) Resp:  [19-37] 19 (12/16 0700) BP: (102-141)/(26-84) 104/38 mmHg (12/16 0700) SpO2:  [98 %-100 %] 100 % (12/16 0700) FiO2 (%):  [30 %] 30 % (12/16 0700) Weight:  [0.03 kg (1.1 oz)] 0.03 kg (1.1 oz) (12/16 0431)  Weight change:  Filed Weights   11/04/15 0431  Weight: 0.03 kg (1.1 oz)    Intake/Output: I/O last 3 completed shifts: In: 2020 [NG/GT:2020] Out: -    Intake/Output this shift:     Physical Exam: General: critically ill appearing  Head/ENT:  OM moist, NG tube  Eyes: anicteric  Neck: Tracheostomy in place  Lungs:  Scattered rhonchi, vent assisted  fio2 30%  Heart: S1S2 no rubs  Abdomen:  Soft, nontender, BS present  Extremities: 2+ dependent edema, anasarca  Neurologic: Lethargic but arousable today, not following commands  Access:  R IJ permcath.       Basic Metabolic Panel:  Recent Labs Lab 10/28/15 1050 10/30/15 1213 10/31/15 0448 11/01/15 1109  NA 140  --  144 147*  K 5.8* 5.6* 6.0* 5.2*  CL 97*  --  101 102  CO2 33*  --  34* 35*  GLUCOSE 152*  --  109* 126*  BUN 72*  --  92* 60*  CREATININE 1.25*  --  1.43* 1.11*  CALCIUM 8.7*  --  8.7* 8.8*  MG  --   --   --  2.1  PHOS 4.7*  --  5.2* 4.3    Liver Function Tests:  Recent Labs Lab 10/28/15 1050 10/31/15 0448  ALBUMIN 2.1* 2.0*   No results for input(s): LIPASE, AMYLASE in the last 168 hours. No results for input(s): AMMONIA in the last 168 hours.  CBC:  Recent Labs Lab 10/28/15 1050 10/30/15 0500 10/31/15 0448 11/01/15 1109 11/04/15 0548  WBC 8.2 8.1 9.4 11.9* 15.7*  HGB 7.9* 7.7* 7.7* 8.0* 7.3*  HCT 25.1* 24.6* 24.4* 26.4* 24.1*  MCV 96.0 97.4 95.9 97.4 98.2  PLT 248 249 257  250 213    Cardiac Enzymes: No results for input(s): CKTOTAL, CKMB, CKMBINDEX, TROPONINI in the last 168 hours.  BNP: Invalid input(s): POCBNP  CBG:  Recent Labs Lab 11/03/15 1557 11/03/15 1939 11/03/15 2325 11/04/15 0330 11/04/15 0731  GLUCAP 145* 151* 127* 119* 123*    Microbiology: Results for orders placed or performed during the hospital encounter of 07/26/2015  Blood Culture (routine x 2)     Status: None   Collection Time: 07/22/2015  8:51 AM  Result Value Ref Range Status   Specimen Description BLOOD Dolores HooseUNKO  Final   Special Requests BOTTLES DRAWN AEROBIC AND ANAEROBIC  3CC  Final   Culture NO GROWTH 5 DAYS  Final   Report Status 08/17/2015 FINAL  Final  Blood Culture (routine x 2)     Status: None   Collection Time: 08/17/2015  9:20 AM  Result Value Ref Range Status   Specimen Description BLOOD LEFT ARM  Final   Special Requests BOTTLES DRAWN AEROBIC AND ANAEROBIC  1CC  Final   Culture NO GROWTH 5 DAYS  Final   Report Status 08/17/2015 FINAL  Final  Wound culture     Status: None   Collection  Time: 08/14/2015  9:20 AM  Result Value Ref Range Status   Specimen Description DECUBITIS  Final   Special Requests Normal  Final   Gram Stain   Final    FEW WBC SEEN MANY GRAM NEGATIVE RODS RARE GRAM POSITIVE COCCI    Culture   Final    HEAVY GROWTH ESCHERICHIA COLI MODERATE GROWTH ENTEROBACTER AEROGENES PROTEUS MIRABILIS HEAVY GROWTH ENTEROCOCCUS SPECIES VRE HAVE INTRINSIC RESISTANCE TO MOST COMMONLY USED ANTIBIOTICS AND THE ABILITY TO ACQUIRE RESISTANCE TO MOST AVAILABLE ANTIBIOTICS.    Report Status 08/16/2015 FINAL  Final   Organism ID, Bacteria ESCHERICHIA COLI  Final   Organism ID, Bacteria ENTEROBACTER AEROGENES  Final   Organism ID, Bacteria PROTEUS MIRABILIS  Final   Organism ID, Bacteria ENTEROCOCCUS SPECIES  Final      Susceptibility   Enterobacter aerogenes - MIC*    CEFTAZIDIME <=1 SENSITIVE Sensitive     CEFAZOLIN >=64 RESISTANT Resistant      CEFTRIAXONE <=1 SENSITIVE Sensitive     CIPROFLOXACIN <=0.25 SENSITIVE Sensitive     GENTAMICIN <=1 SENSITIVE Sensitive     IMIPENEM 1 SENSITIVE Sensitive     TRIMETH/SULFA <=20 SENSITIVE Sensitive     * MODERATE GROWTH ENTEROBACTER AEROGENES   Escherichia coli - MIC*    AMPICILLIN <=2 SENSITIVE Sensitive     CEFTAZIDIME <=1 SENSITIVE Sensitive     CEFAZOLIN <=4 SENSITIVE Sensitive     CEFTRIAXONE <=1 SENSITIVE Sensitive     CIPROFLOXACIN <=0.25 SENSITIVE Sensitive     GENTAMICIN <=1 SENSITIVE Sensitive     IMIPENEM <=0.25 SENSITIVE Sensitive     TRIMETH/SULFA <=20 SENSITIVE Sensitive     * HEAVY GROWTH ESCHERICHIA COLI   Proteus mirabilis - MIC*    AMPICILLIN >=32 RESISTANT Resistant     CEFTAZIDIME <=1 SENSITIVE Sensitive     CEFAZOLIN 8 SENSITIVE Sensitive     CEFTRIAXONE <=1 SENSITIVE Sensitive     CIPROFLOXACIN <=0.25 SENSITIVE Sensitive     GENTAMICIN <=1 SENSITIVE Sensitive     IMIPENEM 1 SENSITIVE Sensitive     TRIMETH/SULFA <=20 SENSITIVE Sensitive     * PROTEUS MIRABILIS   Enterococcus species - MIC*    AMPICILLIN >=32 RESISTANT Resistant     VANCOMYCIN >=32 RESISTANT Resistant     GENTAMICIN SYNERGY SENSITIVE Sensitive     TETRACYCLINE Value in next row Resistant      RESISTANT>=16    * HEAVY GROWTH ENTEROCOCCUS SPECIES  MRSA PCR Screening     Status: None   Collection Time: 07/26/2015  2:38 PM  Result Value Ref Range Status   MRSA by PCR NEGATIVE NEGATIVE Final    Comment:        The GeneXpert MRSA Assay (FDA approved for NASAL specimens only), is one component of a comprehensive MRSA colonization surveillance program. It is not intended to diagnose MRSA infection nor to guide or monitor treatment for MRSA infections.   Blood culture (single)     Status: None   Collection Time: 07/27/2015  3:36 PM  Result Value Ref Range Status   Specimen Description BLOOD RIGHT ASSIST CONTROL  Final   Special Requests BOTTLES DRAWN AEROBIC AND ANAEROBIC  Final    Culture NO GROWTH 5 DAYS  Final   Report Status 08/17/2015 FINAL  Final  Wound culture     Status: None   Collection Time: 08/13/15 12:37 PM  Result Value Ref Range Status   Specimen Description WOUND  Final   Special Requests Normal  Final  Gram Stain   Final    FEW WBC SEEN TOO NUMEROUS TO COUNT GRAM NEGATIVE RODS FEW GRAM POSITIVE COCCI    Culture   Final    HEAVY GROWTH ESCHERICHIA COLI MODERATE GROWTH PROTEUS MIRABILIS LIGHT GROWTH KLEBSIELLA PNEUMONIAE MODERATE GROWTH ENTEROCOCCUS GALLINARUM CRITICAL RESULT CALLED TO, READ BACK BY AND VERIFIED WITH: [place] BORBA AT 1042 08/16/15 DV    Report Status 08/17/2015 FINAL  Final   Organism ID, Bacteria ESCHERICHIA COLI  Final   Organism ID, Bacteria PROTEUS MIRABILIS  Final   Organism ID, Bacteria KLEBSIELLA PNEUMONIAE  Final   Organism ID, Bacteria ENTEROCOCCUS GALLINARUM  Final      Susceptibility   Escherichia coli - MIC*    AMPICILLIN >=32 RESISTANT Resistant     CEFTAZIDIME 4 RESISTANT Resistant     CEFAZOLIN >=64 RESISTANT Resistant     CEFTRIAXONE 16 RESISTANT Resistant     GENTAMICIN 2 SENSITIVE Sensitive     IMIPENEM >=16 RESISTANT Resistant     TRIMETH/SULFA <=20 SENSITIVE Sensitive     Extended ESBL POSITIVE Resistant     PIP/TAZO Value in next row Resistant      RESISTANT>=128    CIPROFLOXACIN Value in next row Sensitive      SENSITIVE<=0.25    * HEAVY GROWTH ESCHERICHIA COLI   Klebsiella pneumoniae - MIC*    AMPICILLIN Value in next row Resistant      SENSITIVE<=0.25    CEFTAZIDIME Value in next row Resistant      SENSITIVE<=0.25    CEFAZOLIN Value in next row Resistant      SENSITIVE<=0.25    CEFTRIAXONE Value in next row Resistant      SENSITIVE<=0.25    CIPROFLOXACIN Value in next row Resistant      SENSITIVE<=0.25    GENTAMICIN Value in next row Sensitive      SENSITIVE<=0.25    IMIPENEM Value in next row Resistant      SENSITIVE<=0.25    TRIMETH/SULFA Value in next row Resistant       SENSITIVE<=0.25    PIP/TAZO Value in next row Resistant      RESISTANT>=128    * LIGHT GROWTH KLEBSIELLA PNEUMONIAE   Proteus mirabilis - MIC*    AMPICILLIN Value in next row Resistant      RESISTANT>=128    CEFTAZIDIME Value in next row Sensitive      RESISTANT>=128    CEFAZOLIN Value in next row Sensitive      RESISTANT>=128    CEFTRIAXONE Value in next row Sensitive      RESISTANT>=128    CIPROFLOXACIN Value in next row Sensitive      RESISTANT>=128    GENTAMICIN Value in next row Sensitive      RESISTANT>=128    IMIPENEM Value in next row Sensitive      RESISTANT>=128    TRIMETH/SULFA Value in next row Sensitive      RESISTANT>=128    PIP/TAZO Value in next row Sensitive      SENSITIVE<=4    * MODERATE GROWTH PROTEUS MIRABILIS   Enterococcus gallinarum - MIC*    AMPICILLIN Value in next row Resistant      SENSITIVE<=4    GENTAMICIN SYNERGY Value in next row Sensitive      SENSITIVE<=4    CIPROFLOXACIN Value in next row Resistant      RESISTANT>=8    TETRACYCLINE Value in next row Resistant      RESISTANT>=16    * MODERATE GROWTH ENTEROCOCCUS GALLINARUM  Wound culture  Status: None   Collection Time: 08/15/15  2:53 PM  Result Value Ref Range Status   Specimen Description WOUND  Final   Special Requests NONE  Final   Gram Stain FEW WBC SEEN NO ORGANISMS SEEN   Final   Culture NO GROWTH 3 DAYS  Final   Report Status 08/18/2015 FINAL  Final  C difficile quick scan w PCR reflex     Status: None   Collection Time: 08/17/15 11:34 AM  Result Value Ref Range Status   C Diff antigen NEGATIVE NEGATIVE Final   C Diff toxin NEGATIVE NEGATIVE Final   C Diff interpretation Negative for C. difficile  Final  Stool culture     Status: None   Collection Time: 09/03/15  3:51 PM  Result Value Ref Range Status   Specimen Description STOOL  Final   Special Requests Immunocompromised  Final   Culture   Final    NO SALMONELLA OR SHIGELLA ISOLATED No Pathogenic E. coli  detected NO CAMPYLOBACTER DETECTED    Report Status 09/07/2015 FINAL  Final  C difficile quick scan w PCR reflex     Status: None   Collection Time: 09/13/15 12:51 AM  Result Value Ref Range Status   C Diff antigen NEGATIVE NEGATIVE Final   C Diff toxin NEGATIVE NEGATIVE Final   C Diff interpretation Negative for C. difficile  Final  Culture, blood (routine x 2)     Status: None   Collection Time: 09/16/15 11:24 AM  Result Value Ref Range Status   Specimen Description BLOOD LEFT HAND  Final   Special Requests BOTTLES DRAWN AEROBIC AND ANAEROBIC  1CC  Final   Culture NO GROWTH 5 DAYS  Final   Report Status 09/21/2015 FINAL  Final  Culture, blood (routine x 2)     Status: None   Collection Time: 09/16/15 12:23 PM  Result Value Ref Range Status   Specimen Description BLOOD RIGHT HAND  Final   Special Requests BOTTLES DRAWN AEROBIC AND ANAEROBIC  1CC  Final   Culture  Setup Time   Final    GRAM POSITIVE COCCI AEROBIC BOTTLE ONLY CRITICAL RESULT CALLED TO, READ BACK BY AND VERIFIED WITH: TESS THOMAS,RN 09/17/2015 0631 BY JRS.    Culture   Final    ENTEROCOCCUS FAECALIS AEROBIC BOTTLE ONLY VRE HAVE INTRINSIC RESISTANCE TO MOST COMMONLY USED ANTIBIOTICS AND THE ABILITY TO ACQUIRE RESISTANCE TO MOST AVAILABLE ANTIBIOTICS. CRITICAL RESULT CALLED TO, READ BACK BY AND VERIFIED WITH: CHERYL SMITH AT 1610 09/19/15 DV    Report Status 09/21/2015 FINAL  Final   Organism ID, Bacteria ENTEROCOCCUS FAECALIS  Final      Susceptibility   Enterococcus faecalis - MIC*    AMPICILLIN <=2 SENSITIVE Sensitive     LINEZOLID 2 SENSITIVE Sensitive     CIPROFLOXACIN Value in next row Resistant      RESISTANT>=8    TETRACYCLINE Value in next row Resistant      RESISTANT>=16    VANCOMYCIN Value in next row Resistant      RESISTANT>=32    GENTAMICIN SYNERGY Value in next row Resistant      RESISTANT>=32    * ENTEROCOCCUS FAECALIS  Culture, respiratory (NON-Expectorated)     Status: None   Collection  Time: 09/16/15  3:50 PM  Result Value Ref Range Status   Specimen Description TRACHEAL ASPIRATE  Final   Special Requests Immunocompromised  Final   Gram Stain   Final    FEW WBC SEEN GOOD SPECIMEN -  80-90% WBCS RARE GRAM NEGATIVE RODS    Culture LIGHT GROWTH PSEUDOMONAS AERUGINOSA  Final   Report Status 09/19/2015 FINAL  Final   Organism ID, Bacteria PSEUDOMONAS AERUGINOSA  Final      Susceptibility   Pseudomonas aeruginosa - MIC*    CEFTAZIDIME 8 SENSITIVE Sensitive     CIPROFLOXACIN 2 INTERMEDIATE Intermediate     GENTAMICIN >=16 RESISTANT Resistant     IMIPENEM >=16 RESISTANT Resistant     PIP/TAZO Value in next row Sensitive      SENSITIVE32    CEFEPIME Value in next row Sensitive      SENSITIVE8    LEVOFLOXACIN Value in next row Resistant      RESISTANT>=8    * LIGHT GROWTH PSEUDOMONAS AERUGINOSA  Culture, expectorated sputum-assessment     Status: None   Collection Time: 09/22/15  2:09 PM  Result Value Ref Range Status   Specimen Description ENDOTRACHEAL  Final   Special Requests Normal  Final   Sputum evaluation THIS SPECIMEN IS ACCEPTABLE FOR SPUTUM CULTURE  Final   Report Status 09/24/2015 FINAL  Final  Culture, respiratory (NON-Expectorated)     Status: None   Collection Time: 09/22/15  2:09 PM  Result Value Ref Range Status   Specimen Description ENDOTRACHEAL  Final   Special Requests Normal Reflexed from [id number]  Final   Gram Stain   Final    FEW WBC SEEN FEW GRAM NEGATIVE RODS POOR SPECIMEN - LESS THAN 70% WBCS    Culture   Final    MODERATE GROWTH PSEUDOMONAS AERUGINOSA LIGHT GROWTH KLEBSIELLA PNEUMONIAE REFER TO SENSITIVITIES FROM PREVIOUS CULTURE FOR ORG 2    Report Status 10/01/2015 FINAL  Final   Organism ID, Bacteria PSEUDOMONAS AERUGINOSA  Final   Organism ID, Bacteria KLEBSIELLA PNEUMONIAE  Final      Susceptibility   Pseudomonas aeruginosa - MIC*    CEFTAZIDIME 4 SENSITIVE Sensitive     CIPROFLOXACIN 2 INTERMEDIATE Intermediate      GENTAMICIN >=16 RESISTANT Resistant     IMIPENEM >=16 RESISTANT Resistant     PIP/TAZO Value in next row Sensitive      SENSITIVE16    * MODERATE GROWTH PSEUDOMONAS AERUGINOSA  Tissue culture     Status: None   Collection Time: 09/24/15  6:44 AM  Result Value Ref Range Status   Specimen Description BONE  Final   Special Requests Normal  Final   Gram Stain MODERATE WBC SEEN FEW GRAM NEGATIVE RODS   Final   Culture   Final    MODERATE GROWTH ESCHERICHIA COLI MODERATE GROWTH KLEBSIELLA PNEUMONIAE ESBL-EXTENDED SPECTRUM BETA LACTAMASE-THE ORGANISM IS RESISTANT TO PENICILLINS, CEPHALOSPORINS AND AZTREONAM ACCORDING TO CLSI M100-S15 VOL.25 N01 JAN 2005. ORGANISM 1 This organism isolate is resistant to one or more antiotic agents in three or more antimicrobial categories.  Suggest Infectious Disease consult.   ORGANISM 2 CRITICAL RESULT CALLED TO, READ BACK BY AND VERIFIED WITH: RN Mardene Celeste LINDSAY 09/27/15 1005AM    Report Status 09/28/2015 FINAL  Final   Organism ID, Bacteria ESCHERICHIA COLI  Final   Organism ID, Bacteria KLEBSIELLA PNEUMONIAE  Final      Susceptibility   Escherichia coli - MIC*    AMPICILLIN >=32 RESISTANT Resistant     CEFTAZIDIME 4 RESISTANT Resistant     CEFAZOLIN >=64 RESISTANT Resistant     CEFTRIAXONE 8 RESISTANT Resistant     GENTAMICIN <=1 SENSITIVE Sensitive     IMIPENEM 8 RESISTANT Resistant     TRIMETH/SULFA <=20 SENSITIVE  Sensitive     Extended ESBL POSITIVE Resistant     PIP/TAZO Value in next row Resistant      RESISTANT>=128    * MODERATE GROWTH ESCHERICHIA COLI   Klebsiella pneumoniae - MIC*    AMPICILLIN Value in next row Resistant      RESISTANT>=128    CEFTAZIDIME Value in next row Resistant      RESISTANT>=128    CEFAZOLIN Value in next row Resistant      RESISTANT>=128    CEFTRIAXONE Value in next row Resistant      RESISTANT>=128    CIPROFLOXACIN Value in next row Resistant      RESISTANT>=128    GENTAMICIN Value in next row Resistant       RESISTANT>=128    IMIPENEM Value in next row Resistant      RESISTANT>=128    TRIMETH/SULFA Value in next row Sensitive      RESISTANT>=128    PIP/TAZO Value in next row Resistant      RESISTANT>=128    * MODERATE GROWTH KLEBSIELLA PNEUMONIAE  Anaerobic culture     Status: None   Collection Time: 09/24/15  3:55 PM  Result Value Ref Range Status   Specimen Description BONE  Final   Special Requests Normal  Final   Culture NO ANAEROBES ISOLATED  Final   Report Status 09/29/2015 FINAL  Final  Culture, fungus without smear     Status: None   Collection Time: 09/24/15  3:55 PM  Result Value Ref Range Status   Specimen Description BONE  Final   Special Requests Normal  Final   Culture NO FUNGUS ISOLATED AFTER 24 DAYS  Final   Report Status 10/18/2015 FINAL  Final  C difficile quick scan w PCR reflex     Status: None   Collection Time: 10/07/15  2:02 PM  Result Value Ref Range Status   C Diff antigen NEGATIVE NEGATIVE Final   C Diff toxin NEGATIVE NEGATIVE Final   C Diff interpretation Negative for C. difficile  Final  Culture, blood (routine x 2)     Status: None   Collection Time: 10/16/15  9:52 AM  Result Value Ref Range Status   Specimen Description BLOOD LEFT HAND  Final   Special Requests   Final    BOTTLES DRAWN AEROBIC AND ANAEROBIC  AER 6CC ANA 4CC   Culture NO GROWTH 6 DAYS  Final   Report Status 10/22/2015 FINAL  Final  Culture, blood (routine x 2)     Status: None   Collection Time: 10/16/15 12:25 PM  Result Value Ref Range Status   Specimen Description BLOOD LEFT HAND  Final   Special Requests BOTTLES DRAWN AEROBIC AND ANAEROBIC  5CC  Final   Culture NO GROWTH 6 DAYS  Final   Report Status 10/22/2015 FINAL  Final  Culture, expectorated sputum-assessment     Status: None   Collection Time: 10/18/15  1:15 PM  Result Value Ref Range Status   Specimen Description SPUTUM  Final   Special Requests NONE  Final   Sputum evaluation THIS SPECIMEN IS ACCEPTABLE FOR  SPUTUM CULTURE  Final   Report Status 10/18/2015 FINAL  Final  Culture, respiratory (NON-Expectorated)     Status: None   Collection Time: 10/18/15  1:15 PM  Result Value Ref Range Status   Specimen Description SPUTUM  Final   Special Requests NONE Reflexed from [id number]  Final   Gram Stain   Final    GOOD SPECIMEN - 80-90%  WBCS MODERATE WBC SEEN MANY GRAM NEGATIVE RODS    Culture   Final    HEAVY GROWTH PSEUDOMONAS AERUGINOSA MODERATE GROWTH KLEBSIELLA PNEUMONIAE CRITICAL RESULT CALLED TO, READ BACK BY AND VERIFIED WITHRod Mae AT 1610 10/21/15 DV KLEBSIELLA PNEUMONIAE This organism isolate is resistant to one or more antiotic agents in three or more antimicrobial categories.  Suggest Infectious Disease  consult.      Report Status 10/22/2015 FINAL  Final   Organism ID, Bacteria PSEUDOMONAS AERUGINOSA  Final   Organism ID, Bacteria KLEBSIELLA PNEUMONIAE  Final      Susceptibility   Klebsiella pneumoniae - MIC*    AMPICILLIN >=32 RESISTANT Resistant     CEFAZOLIN >=64 RESISTANT Resistant     CEFTRIAXONE >=64 RESISTANT Resistant     CIPROFLOXACIN >=4 RESISTANT Resistant     GENTAMICIN >=16 RESISTANT Resistant     IMIPENEM >=16 RESISTANT Resistant     NITROFURANTOIN >=512 RESISTANT Resistant     TRIMETH/SULFA <=20 SENSITIVE Sensitive     * MODERATE GROWTH KLEBSIELLA PNEUMONIAE   Pseudomonas aeruginosa - MIC*    CEFTAZIDIME 4 SENSITIVE Sensitive     CIPROFLOXACIN >=4 RESISTANT Resistant     GENTAMICIN 8 INTERMEDIATE Intermediate     IMIPENEM >=16 RESISTANT Resistant     PIP/TAZO Value in next row Sensitive      SENSITIVE32    * HEAVY GROWTH PSEUDOMONAS AERUGINOSA  C difficile quick scan w PCR reflex     Status: None   Collection Time: 10/18/15  4:39 PM  Result Value Ref Range Status   C Diff antigen NEGATIVE NEGATIVE Final   C Diff toxin NEGATIVE NEGATIVE Final   C Diff interpretation Negative for C. difficile  Final    Coagulation Studies: No results for input(s):  LABPROT, INR in the last 72 hours.  Urinalysis: No results for input(s): COLORURINE, LABSPEC, PHURINE, GLUCOSEU, HGBUR, BILIRUBINUR, KETONESUR, PROTEINUR, UROBILINOGEN, NITRITE, LEUKOCYTESUR in the last 72 hours.  Invalid input(s): APPERANCEUR    Imaging: US Venous Img Lower Bilateral  11/03/2015  CLINICAL DATA:  Swelling for 3 months. EXAM: BILATERAL LOWER EXTREMITY VENOUS DOPPLER ULTRASOUND TECHNIQUE: Gray-scale sonography with graded compression, as well as color Doppler and duplex ultrasound were performed to evaluate the lower extremity deep venous systems from the level of the common femoral vein and including the common femoral, femoral, profunda femoral, popliteal and calf veins including the posterior tibial, peroneal and gastrocnemius veins when visible. The superficial great saphenous vein was also interrogated. Spectral Doppler was utilized to evaluate flow at rest and with distal augmentation maneuvers in the common femoral, femoral and popliteal veins. COMPARISON:  08/28/2015 FINDINGS: RIGHT LOWER EXTREMITY Normal compressibility and color Doppler flow in the right common femoral vein. The right profunda femoral vein is patent without thrombus. There is a small amount of echogenic material along the wall of the proximal right femoral vein but this vessel appears to be compressible. This area is difficult to evaluate due to body habitus. Normal compressibility and color Doppler flow in the remainder of the right femoral vein and the right popliteal vein. Limited evaluation of the deep calf veins due to body habitus but no clear evidence for thrombus. LEFT LOWER EXTREMITY Normal compressibility at the left common femoral vein and left saphenofemoral junction without thrombus. Normal compressibility in the proximal left femoral vein. Limited evaluation of the mid and distal left femoral vein due to body habitus. Normal compressibility in the left popliteal vein without thrombus. Limited  evaluation of the  calf veins due to body habitus but no clear evidence for thrombus. There is color Doppler flow in the left common femoral vein, profunda femoral vein and visualized left femoral vein. Normal color Doppler flow and augmentation in left popliteal vein. Other Findings:  None. IMPRESSION: Technically challenging examination due to body habitus. No evidence for an acute DVT in either lower extremity. A small amount of echogenic material along the wall of the proximal right femoral vein is indeterminate. This could represent old nonocclusive thrombus. Electronically Signed   By: Richarda Overlie M.D.   On: 11/03/2015 12:56     Medications:   . feeding supplement (JEVITY 1.5 CAL/FIBER) 1,000 mL (11/02/15 2333)  . norepinephrine Stopped (10/08/15 0530)   . albumin human  25 g Intravenous Once  . amantadine  200 mg Per Tube Q7 days  . antiseptic oral rinse  7 mL Mouth Rinse QID  . aspirin  81 mg Oral Daily  . chlorhexidine gluconate  15 mL Mouth Rinse BID  . epoetin (EPOGEN/PROCRIT) injection  10,000 Units Intravenous Q M,W,F-HD  . escitalopram  10 mg Oral Daily  . feeding supplement (PRO-STAT SUGAR FREE 64)  30 mL Oral 6 X Daily  . folic acid  1 mg Oral Daily  . free water  20 mL Per Tube 6 times per day  . heparin subcutaneous  5,000 Units Subcutaneous Q12H  . hydrocortisone  10 mg Per Tube BID  . insulin aspart  0-9 Units Subcutaneous 6 times per day  . lidocaine  1 patch Transdermal Q24H  . loperamide  2 mg Oral BID  . midodrine  5 mg Per Tube TID WC  . mirtazapine  7.5 mg Oral QHS  . multivitamin  5 mL Oral Daily  . pantoprazole sodium  40 mg Per Tube Daily  . saccharomyces boulardii  250 mg Oral BID  . sodium chloride  10-40 mL Intracatheter Q12H  . sodium hypochlorite   Irrigation BID  . sulfamethoxazole-trimethoprim  2.5 tablet Per Tube q1800  . thiamine  100 mg Per Tube Daily   sodium chloride, sodium chloride, acetaminophen (TYLENOL) oral liquid 160 mg/5 mL,  HYDROmorphone (DILAUDID) injection, ipratropium-albuterol, morphine injection, ondansetron (ZOFRAN) IV, oxyCODONE, sodium chloride, zinc oxide  Assessment/ Plan:  49 y.o. 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. Admitted on 07/29/2015  1. End-stage renal disease on hemodialysis on HD MWF. The patient has been on dialysis since October of 2014. R IJ permcath. Dialysis treatment with IV albumin for oncotic support. Midodrine and hydrocortisone ordered for blood pressure support.  - Patient due for hemodialysis today. Target blood flow rate 300. Ultrafiltration target 1 kg. Continue albumen and midodrine.  2. Anemia of CKD. Hemoglobin down to 7.3. Continue Epogen with dialysis.   3. Secondary hyperparathyroidism: PTH 50, phos normal, continue to montior bone mineral metabolism parameters.   4. Sepsis: VRE in blood. Bone cultures E. Coli and klebsiella - Treatment as per ID and ICU team: daptomycin completed on 11/10 and now on high dose trimethoprim/sulfamethoxazole.   5. Acute resp failure:  -  Patient remains on the ventilator at this point in time. FiO2 30%. Has been difficult to wean off the ventilator.   6. Generalized edema - As above ultrafiltration target 1 kg today. We will continue to use midodrine for blood pressure support as well as albumin.  7.  Hyperkalemia:  Continue 2 potassium bath.   LOS: 84 Ellisha Bankson,  Rikia Sukhu 12/16/20169:47 AM

## 2015-11-04 NOTE — Progress Notes (Signed)
MEDICATION RELATED CONSULT NOTE - Follow up   Pharmacy Consult for Renal dosing  Allergies  Allergen Reactions  . Contrast Media [Iodinated Diagnostic Agents] Anaphylaxis  . Ampicillin Rash    Patient Measurements: Height:  (SCALE BROKE) Weight: 177 lb 11.1 oz (80.6 kg) IBW/kg (Calculated) : 66.2  Vital Signs: Temp: 97.9 F (36.6 C) (12/16 1553) Temp Source: Axillary (12/16 1553) BP: 105/78 mmHg (12/16 1600) Pulse Rate: 101 (12/16 1200)  Recent Labs  11/04/15 0548  WBC 15.7*  HGB 7.3*  HCT 24.1*  PLT 213  CREATININE 1.31*  MG 2.1  PHOS 4.5   Estimated Creatinine Clearance: 58.4 mL/min (by C-G formula based on Cr of 1.31).   Assessment: 49 yo patient on HD. Pharmacy consulted for renal dosing of medications.   Plan:  No medications require adjustment at present. Pharmacy will continue to monitor patients medications and dose adjust as needed.    Luisa HartScott Trammell Bowden, PharmD 11/04/2015

## 2015-11-04 NOTE — Progress Notes (Signed)
Nutrition Follow-up  DOCUMENTATION CODES:   Severe malnutrition in context of acute illness/injury  INTERVENTION:   EN: per MD Kasa, plan to keep pt NPO today (bowel rest) with plan to resume TF tomorrow; continue TF per MD orders, continue to follow along and provide assistance as needed. No further recommendations at this time Coordination of Care: asked Sarah RN regarding new weight for pt; apparently there is an issue with the bed scale, plan to replace current wt with previous wt and will attempt to weigh pt using lift if possible   NUTRITION DIAGNOSIS:   Inadequate oral intake related to wound healing, acute illness, chronic illness as evidenced by NPO status, estimated needs.  GOAL:   Patient will meet greater than or equal to 90% of their needs  MONITOR:    (Energy Intake, Anthropometrics, Digestive System, Electrolyte/Renal Profile, Glucose Profile)  REASON FOR ASSESSMENT:   Consult Enteral/tube feeding initiation and management  ASSESSMENT:    Pt remains on vent via trach, weaning trials as tolerated, HD as scheduled, evaluated by Psych again yesterday  Diet Order:   NPO  EN: Jevity 1.5 at 50 ml/hr, Prostat 6 packets daily, 20 mL free water q 4 hours; TF on hold this AM due to vomiting  Digestive System: abdominal xray negative and confirmed correct dobhoff placement, abdomen obese/soft, BS hypoactive; continues with loose stools, 1-3 per day per documentation despite imodium changed back to BID scheduled daily  Skin:   (stage IV sacrum, stage III hip, unstageable foot; rectal ulcer from fleixseal, ulcer in nose from dobhoff)  Meds:  ss novolog, MVI, lexapro, folic acid, imodium BID daily (restarted 12/12 as scheduled), midodrine, remeron started yesterday, morphine, florastor, protonix, oxycodone, thiamine  Height:   Ht Readings from Last 1 Encounters:  01/25/12 5\' 9"  (1.753 m)    Weight:   Wt Readings from Last 1 Encounters:  11/04/15 1.1 oz (0.03 kg)    BMI:  Body mass index is 0.01 kg/(m^2).  Estimated Nutritional Needs:   Kcal:  TEE 1851 kcals/d (Ve 7.4, Tmax 37.4) Using current wt (97kg) edema noted (re-intubated)  Protein:  (1.5-2.0 g/kg)145-194 g/d but likely closer to 2.0-2.5 g/kg Using current wt of 97kg  Fluid:  1000ml + UOP  EDUCATION NEEDS:   No education needs identified at this time  LOW Care Level  Romelle Starcherate Esther Bradstreet MS, RD, LDN [phone] Pager

## 2015-11-04 NOTE — Progress Notes (Signed)
[facility] [place] Critical Care Medicine Progess Note  Name: Courtney [name] MRN: [id number] DOB: 08/01/1966    ADMISSION DATE:  August 17, 2015   INITIAL PRESENTATION:  49 AAF who has been in medical facilities (hosp, LTAC, rehab) for 2 yrs following gastric bypass surgery with multiple complications. Now with chronic trach, ESRD, profound debilitation, severe sacral pressure ulcer. Was seemingly making progress and transferred to rehab facility approx one week prior to this admission. She was sent to [facility] ED with AMS and hypotension. Working dx of severe sepsis/septic shock due to infected sacral pressure ulcer. Since admission. Her course is been very complicated with numerous complications including septic shock and GI bleeding. Now with failure to wean from vent    INTERVAL HISTORY: alert, noted to have tachypnea when placed on lower settings of pressure support. Placed back on full  Vent support.   We NOT  attempt pressure support due to vomiting.   Regarding PEG tube-husband states that she does have a complex GI anatomy and the obstacle in the past was finding a surgeon suitable to place an NG tube,    difficulty weaning from vent, placed on PRVC mode due to WOB will attempt to wean as tolerated HD as needed per nephrology [place] LE neg for DVT  Summary of MAJOR EVENTS/TEST RESULTS: Admission 02/07/14-05/07/14 Admission 07/21/14-09/06/14 Discharged to [facility]. Pt had palliative consult at that time, were asked to sign off by husband.  09/23 CT head: NAD 09/23 EEG: no epileptiform activity 09/23 PRBCs for Hgb 6.4 09/24 bedside debridement of sacral wound. Abscess drained 09/25 Off vasopressors. More alert. No distress. Worsening thrombocytopenia. Vanc DC'd 09/29  Dr. Sampson Goon (I.D) excused from the case by patient's husband. 10/02 MRI -multiple infarcts 10/03 tracheal bleeding- transfused platelets 10/03 hospitalist service excused from the case by patient's husband 10/03  Echocardiogram ejection fraction was 55-60%, pulmonary systolic pressure was 39 mmHg 40/98 restart TF's at lower rate, attempt reg HD  10/12 Transferred to med-surg floor. Remains on PCCM service 10/14 SLP eval: pt unable to tolerate PMV adequately 10/17-will re-attempt PM valve-discussed with Speech therapist 10/18 passed swallow eval-start pureed thick foods no thin liquids-continue NG feeds 10/19 transferred to step down for sepsis/aspiration pneumonia 10/19 cxr shows RLL opacity 10/21 sacral decub debride at bedside by surgery 10/22 started back on vasopressors while on HD 10/27 CT with osteo, R hip fx, unable to identify tip of dubhoff tube - sent for fluoro study 10/28 Ortho consultation: I do not feel that she is a surgical candidate. Therefore, I feel that it would best to manage this fracture nonsurgically and allow it to heal by itself over time, which it should.  10/28 Gen Surg consultation: Due to the lack of free air or free flow of contrast and the peritoneum there is no indication for any surgical intervention on this. Would recommend pulling back the feeding tube 1-2 cm. Would recheck an abdominal film to confirm no pneumoperitoneum in the morning. Absence of any changes okay to continue using Dobbhoff for feeding and medications. 10/28 gastrograffin study: The study confirms that the feeding tube pes perforated through the duodenum and the tip is within a cavity that fills with injected contrast. The cavity does appear walled off 11/4 refuses oral feeds, continue TF's CT reviewed: T8-T9 discitis/osteomyelitis, RT HIP FRACTURE 11/5 placed back on vasopressors, placed back on Vent due to resp acidosis, levophed turned off 11/6 afternoon 11/5 PM Trach changed out due to cuff leak, #6 Shiley cuffed, 11/6 dubhoff occluded, removed and  replaced, new dubhoff shows tube in the antral stomach 11/15 refusing to take oral feeds 11/18 placed back on Vent for increased WOB,SOB. 11/28 Wound  care as re -consulted, there appears to be a new pocket/fistula around the area of her stage IV sacral wound. 10/18/2015; the patient developed a new left basal pneumonia; with recurrent sepsis.  10/19/2015; fevers resolved with IV acetaminophen. Tube feeds changed from vital to Jevity.  12/7 CXR with stable b/l opacities.  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 Pneumoniae, P. Mirabilis, VRE Wound 9/26 >> No growth Blood 9/23 >> NEG CDiff 9/27>>neg Stool Cx 10/15>> negative Cdiff 10/25>>neg Trach Aspirate 10/28>> light growth pseudomonas Blood 10/28 >> 1/2 GPC >>  Sputum cultures obtained 09/22/15 due to mucus plugging>>Pseudomonas BONE TISSUE Cx 11/3>>E.coli (ESBL), k. Pneumonia (daptomycin and septra) Bld Cx 11/27>>negative thus far.  Trach Asp 11/27>> grew out Pseudomonas, and Klebsiella pneumonia.  ANTIMICROBIALS:  Aztreonam 9/23 >> 9/24 Vanc 9/23 >> 9/25 Vanc 9/26>>9/27 Daptomycin 9/27>> 10/12, 10/28>> off Meropenem 9/23 >> 10/14, 10/28 >> 10/31 Septra 11/8>> Zosyn 11/27,>> 11/30. Levaquin 11/29>> 11/29. Ceftaz 12/1>>  VITAL SIGNS: Temp:  [97.7 F (36.5 C)-100.1 F (37.8 C)] 98.6 F (37 C) (12/16 0942) Pulse Rate:  [87-102] 95 (12/16 1100) Resp:  [17-34] 22 (12/16 1100) BP: (102-151)/(26-96) 114/96 mmHg (12/16 1100) SpO2:  [98 %-100 %] 99 % (12/16 1100) FiO2 (%):  [30 %] 30 % (12/16 0700) Weight:  [1.1 oz (0.03 kg)-177 lb 11.1 oz (80.6 kg)] 177 lb 11.1 oz (80.6 kg) (12/16 1000) HEMODYNAMICS:   VENTILATOR  SETTINGS: Vent Mode:  [-] PRVC FiO2 (%):  [30 %] 30 % Set Rate:  [14 bmp] 14 bmp Vt Set:  [530 mL] 530 mL PEEP:  [5 cmH20] 5 cmH20 INTAKE / OUTPUT:  Intake/Output Summary (Last 24 hours) at 11/04/15 1142 Last data filed at 11/04/15 0700  Gross per 24 hour  Intake   1160 ml  Output      0 ml  Net   1160 ml   PHYSICAL EXAMINATION: Physical Examination:   VS: BP 114/96 mmHg  Pulse 95  Temp(Src) 98.6 F (37 C) (Axillary)  Resp 22  Ht 5\' 9"  (1.753 m)  Wt 177 lb 11.1 oz (80.6 kg)  BMI 26.23 kg/m2  SpO2 99%  LMP  (LMP Unknown)  General Appearance: No resp distress, coarse upper airway sounds, on vent Neuro: profoundly diffusely weak, alert today, sleeping but easily arousable HEENT: cushingoid facies, PERRLA, EOM intact Neck: trach site clean Pulmonary: clear anteriorly Cardiovascular: reg, + syst murmur Abdomen: soft, NT, +BS Extremities: warm, R>L UE edema +sacral wound    LABORATORY PANEL:   CBC  Recent Labs Lab 11/04/15 0548  WBC 15.7*  HGB 7.3*  HCT 24.1*  PLT 213    Chemistries   Recent Labs Lab 11/01/15 1109  NA 147*  K 5.2*  CL 102  CO2 35*  GLUCOSE 126*  BUN 60*  CREATININE 1.11*  CALCIUM 8.8*  MG 2.1  PHOS 4.3       IMPRESSION/PLAN (current major issues):  Patient with extreme  pain, pain meds given.  Recurrent sepsis, due to pneumonia/sacral wound with chronic osteo of sacrum and  spine  Anemia with macrocytosis -Uncertain if this is due to chronic slow GI bleed or simple critical care associated anemia. She has already receiving folate, thiamine, and multivitamin. Her anemia has not responded to oral medications, this may be due to her GI malabsorption.   Pneumonia with sepsis-remains on vent support -Chest x-ray shows new left lower lobe infiltrate,which is now improving.  Sputum cultures show pseudomonas. The patient is once again, vent dependent. She has developed again recurrent sepsis.Marland Kitchen Sputum cultures showing Pseudomonas,  and Klebsiella, currently covered with this. Ceftaz total 10 days, stop date 12/11  Worsening sacral decubitus ulcer, stage IV. With spine involvement -The patient is already had a sacral bone scraping, and has been placed on Septra for antibiotic, per ID recommendations. Her sacral decubitus does not appear to be improving and in fact appears to be worsening. In addition, she has several new smaller areas of tissue breakdown.  End-stage renal disease. -Continue on hemodialysis per nephrology.  Diarrhea. -loperamide 2 twice daily scheduled   Trach Care -changed on 11/5 to #6 Shiley due to cuff leak  GI-now vomiting hold TF's for now  Patient appears to be stable for transfer to an LTAC facility at this point in time.   Additional Chronic issues and complicating factors during this admission.  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  Thrombocytopenia, resolved - HIT panel negative 9/25 Recurrent Severe sepsis, due to sacral pressure ulcer DM 2, controlled.  Chronic steroid therapy, for a history of of adrenal insufficiency Incidental finding of R hip fx - conservative mgmt. Waxing and waning encephalopathy - she was evaluated by both the psychiatry and neurology services, and it was deemed that the patient lacks decisional capacity at this time. 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 Husband declined TEE Profound deconditioning-criticall illness neuropathy Chronic pain PNEUMONIA    The Patient requires high complexity decision making for assessment and support, frequent evaluation and titration of therapies, application of advanced monitoring technologies and extensive interpretation of multiple databases. Critical Care Time devoted to patient care services described in this note is 35 minutes.   Overall, patient is critically ill, prognosis is guarded.  Patient with Multiorgan failure and at high risk for cardiac  arrest and death.   Will update husband over phone  Lucie Leather, M.D.  [facility]  Medical Director [facility] Medical Director [facility] Cardio-Pulmonary Department

## 2015-11-04 NOTE — Care Management (Signed)
There is a note present from psychiatry entered 12/15.  Asked for attending to contact CM to discuss content of note.  Tube feed on hold due to episode of vomiting.

## 2015-11-04 NOTE — Progress Notes (Addendum)
eLink Physician-Brief Progress Note Patient Name: Courtney GrinderHedda McMillian [name] DOB: [birth year] MRN: [id number]   Date of Service  11/04/2015  HPI/Events of Note  Hypoglycemia - Blood glucose = 69.  eICU Interventions  Will order Hypoglycemia protocol.      Intervention Category Intermediate Interventions: Other:  Lenell AntuSommer,Steven Eugene 11/04/2015, 4:43 PM

## 2015-11-04 NOTE — Progress Notes (Signed)
Patient's CBG 69. Paged MD and hypoglycemic protocol ordered. Will administer 25mL of D50 IV per protocol.

## 2015-11-04 NOTE — Plan of Care (Signed)
Problem: Phase I Progression Outcomes Goal: Tolerating diet Outcome: Progressing TF via dubb hoff tube at goal of 3050ml/hour.  Problem: Discharge Progression Outcomes Goal: Dyspnea controlled Outcome: Progressing Pt tolerated turning on vent with no distress. Goal: Pain controlled with appropriate interventions Outcome: Progressing Pt with c/o abd pain, prn morphine given. Appropriate response.  Goal: Hemodynamically stable Outcome: Not Progressing Pt with MAPs in the 50's occasionally.

## 2015-11-05 LAB — GLUCOSE, CAPILLARY
GLUCOSE-CAPILLARY: 108 mg/dL — AB (ref 65–99)
GLUCOSE-CAPILLARY: 75 mg/dL (ref 65–99)
GLUCOSE-CAPILLARY: 61 mg/dL — AB (ref 65–99)
GLUCOSE-CAPILLARY: 95 mg/dL (ref 65–99)
GLUCOSE-CAPILLARY: 84 mg/dL (ref 65–99)
GLUCOSE-CAPILLARY: 85 mg/dL (ref 65–99)
Glucose-Capillary: 66 mg/dL (ref 65–99)
Glucose-Capillary: 101 mg/dL — ABNORMAL HIGH (ref 65–99)
Glucose-Capillary: 79 mg/dL (ref 65–99)

## 2015-11-05 MED ORDER — JEVITY 1.5 CAL/FIBER PO LIQD
1000.0000 mL | ORAL | Status: DC
  Administered 2015-11-05 – 2015-11-07 (×2): 1000 mL

## 2015-11-05 MED ORDER — POLYVINYL ALCOHOL 1.4 % OP SOLN
1.0000 [drp] | OPHTHALMIC | Status: DC | PRN
  Filled 2015-11-05 (×2): qty 15

## 2015-11-05 MED ORDER — DEXTROSE-NACL 5-0.9 % IV SOLN
INTRAVENOUS | Status: DC
  Administered 2015-11-05 – 2015-11-17 (×6): via INTRAVENOUS

## 2015-11-05 MED ORDER — DEXTROSE 50 % IV SOLN
INTRAVENOUS | Status: AC
  Administered 2015-11-05: 50 mL
  Filled 2015-11-05: qty 50

## 2015-11-05 MED ORDER — DEXTROSE 50 % IV SOLN
INTRAVENOUS | Status: AC
  Administered 2015-11-05: 25 mL
  Filled 2015-11-05: qty 50

## 2015-11-05 NOTE — Progress Notes (Signed)
eLink Physician-Brief Progress Note Patient Name: Courtney [name] DOB: [birth year] MRN: [id number]   Date of Service  11/05/2015  HPI/Events of Note  Patient with episodes of hypoglycemia requiring dextrose IV.  TFs had been stopped earlier in day due to emesis.  Patient is ESRD.  eICU Interventions  Plan: Add D5NS at 25 cc/hr while off TFs.     Intervention Category Intermediate Interventions: Other:  DETERDING,ELIZABETH 11/05/2015, 2:34 AM

## 2015-11-05 NOTE — Progress Notes (Signed)
Hypoglycemic episodes X2 treated with IVP dextrose per protocol. Md notified see new orders.

## 2015-11-05 NOTE — Progress Notes (Signed)
MEDICATION RELATED CONSULT NOTE - Follow up   Pharmacy Consult for Renal dosing  Allergies  Allergen Reactions  . Contrast Media [Iodinated Diagnostic Agents] Anaphylaxis  . Ampicillin Rash    Patient Measurements: Ht 355ft 9in Height:  (SCALE BROKE) Weight: 177 lb 11.1 oz (80.6 kg) IBW/kg (Calculated) : 66.2  Vital Signs: Temp: 99.5 F (37.5 C) (12/17 1143) Temp Source: Axillary (12/17 1143) BP: 123/56 mmHg (12/17 1400) Pulse Rate: 86 (12/17 1400)  Recent Labs  11/04/15 0548  WBC 15.7*  HGB 7.3*  HCT 24.1*  PLT 213  CREATININE 1.31*  MG 2.1  PHOS 4.5   Estimated Creatinine Clearance: 58.4 mL/min (by C-G formula based on Cr of 1.31).   Assessment: 49 yo patient on HD. Pharmacy consulted for renal dosing of medications.   Plan:  No medications require adjustment at present. Pharmacy will continue to monitor patients medications and dose adjust as needed.    Bari MantisKristin Everlene Cunning PharmD Clinical Pharmacist 11/05/2015

## 2015-11-05 NOTE — Plan of Care (Signed)
Problem: Phase I Progression Outcomes Goal: Tolerating diet Outcome: Progressing Patient had episode of emesis on 12/16. Tube feedings held, will reinstate this morning (12/17) at 0930.

## 2015-11-05 NOTE — Progress Notes (Signed)
Subjective:   Hemodialysis yesterday. Tolerated treatment well. UF of .   No family at bedside.    Objective:  Vital signs in last 24 hours:  Temp:  [97.2 F (36.2 C)-99.5 F (37.5 C)] 99.5 F (37.5 C) (12/17 0744) Pulse Rate:  [85-101] 89 (12/17 0700) Resp:  [18-31] 25 (12/17 0700) BP: (63-151)/(25-109) 117/56 mmHg (12/17 0700) SpO2:  [92 %-100 %] 93 % (12/17 0700) FiO2 (%):  [30 %] 30 % (12/17 0809) Weight:  [80.6 kg (177 lb 11.1 oz)] 80.6 kg (177 lb 11.1 oz) (12/17 0206)  Weight change: 80.57 kg (177 lb 10 oz) Filed Weights   11/04/15 0431 11/04/15 1000 11/05/15 0206  Weight: 0.03 kg (1.1 oz) 80.6 kg (177 lb 11.1 oz) 80.6 kg (177 lb 11.1 oz)    Intake/Output: I/O last 3 completed shifts: In: 1553.3 [I.V.:133.3; Other:100; NG/GT:820; IV Piggyback:500] Out: 739 [Other:739]   Intake/Output this shift:     Physical Exam: General: critically ill appearing  Head/ENT:  OM moist, NG tube  Eyes: anicteric  Neck: Tracheostomy in place  Lungs:  Scattered rhonchi, vent assisted  fio2 40%  Heart: S1S2 no rubs  Abdomen:  Soft, nontender, BS present  Extremities: 2+ dependent edema, anasarca  Neurologic: Lethargic but arousable today, not following commands  Access:  R IJ permcath.       Basic Metabolic Panel:  Recent Labs Lab 10/30/15 1213 10/31/15 0448 11/01/15 1109 11/04/15 0548  NA  --  144 147* 145  K 5.6* 6.0* 5.2* 5.0  CL  --  101 102 103  CO2  --  34* 35* 33*  GLUCOSE  --  109* 126* 132*  BUN  --  92* 60* 76*  CREATININE  --  1.43* 1.11* 1.31*  CALCIUM  --  8.7* 8.8* 8.5*  MG  --   --  2.1 2.1  PHOS  --  5.2* 4.3 4.5    Liver Function Tests:  Recent Labs Lab 10/31/15 0448  ALBUMIN 2.0*   No results for input(s): LIPASE, AMYLASE in the last 168 hours. No results for input(s): AMMONIA in the last 168 hours.  CBC:  Recent Labs Lab 10/30/15 0500 10/31/15 0448 11/01/15 1109 11/04/15 0548  WBC 8.1 9.4 11.9* 15.7*  HGB 7.7* 7.7* 8.0*  7.3*  HCT 24.6* 24.4* 26.4* 24.1*  MCV 97.4 95.9 97.4 98.2  PLT 249 257 250 213    Cardiac Enzymes: No results for input(s): CKTOTAL, CKMB, CKMBINDEX, TROPONINI in the last 168 hours.  BNP: Invalid input(s): POCBNP  CBG:  Recent Labs Lab 11/05/15 0234 11/05/15 0341 11/05/15 0514 11/05/15 0559 11/05/15 0728  GLUCAP 108* 75 61* 95 84    Microbiology: Results for orders placed or performed during the hospital encounter of 07/28/2015  Blood Culture (routine x 2)     Status: None   Collection Time: 07/26/2015  8:51 AM  Result Value Ref Range Status   Specimen Description BLOOD [name]  Final   Special Requests BOTTLES DRAWN AEROBIC AND ANAEROBIC  3CC  Final   Culture NO GROWTH 5 DAYS  Final   Report Status 08/17/2015 FINAL  Final  Blood Culture (routine x 2)     Status: None   Collection Time: 08/07/2015  9:20 AM  Result Value Ref Range Status   Specimen Description BLOOD LEFT ARM  Final   Special Requests BOTTLES DRAWN AEROBIC AND ANAEROBIC  1CC  Final   Culture NO GROWTH 5 DAYS  Final   Report Status 08/17/2015 FINAL  Final  Wound culture     Status: None   Collection Time: 07/23/2015  9:20 AM  Result Value Ref Range Status   Specimen Description DECUBITIS  Final   Special Requests Normal  Final   Gram Stain   Final    FEW WBC SEEN MANY GRAM NEGATIVE RODS RARE GRAM POSITIVE COCCI    Culture   Final    HEAVY GROWTH ESCHERICHIA COLI MODERATE GROWTH ENTEROBACTER AEROGENES PROTEUS MIRABILIS HEAVY GROWTH ENTEROCOCCUS SPECIES VRE HAVE INTRINSIC RESISTANCE TO MOST COMMONLY USED ANTIBIOTICS AND THE ABILITY TO ACQUIRE RESISTANCE TO MOST AVAILABLE ANTIBIOTICS.    Report Status 08/16/2015 FINAL  Final   Organism ID, Bacteria ESCHERICHIA COLI  Final   Organism ID, Bacteria ENTEROBACTER AEROGENES  Final   Organism ID, Bacteria PROTEUS MIRABILIS  Final   Organism ID, Bacteria ENTEROCOCCUS SPECIES  Final      Susceptibility   Enterobacter aerogenes - MIC*    CEFTAZIDIME <=1  SENSITIVE Sensitive     CEFAZOLIN >=64 RESISTANT Resistant     CEFTRIAXONE <=1 SENSITIVE Sensitive     CIPROFLOXACIN <=0.25 SENSITIVE Sensitive     GENTAMICIN <=1 SENSITIVE Sensitive     IMIPENEM 1 SENSITIVE Sensitive     TRIMETH/SULFA <=20 SENSITIVE Sensitive     * MODERATE GROWTH ENTEROBACTER AEROGENES   Escherichia coli - MIC*    AMPICILLIN <=2 SENSITIVE Sensitive     CEFTAZIDIME <=1 SENSITIVE Sensitive     CEFAZOLIN <=4 SENSITIVE Sensitive     CEFTRIAXONE <=1 SENSITIVE Sensitive     CIPROFLOXACIN <=0.25 SENSITIVE Sensitive     GENTAMICIN <=1 SENSITIVE Sensitive     IMIPENEM <=0.25 SENSITIVE Sensitive     TRIMETH/SULFA <=20 SENSITIVE Sensitive     * HEAVY GROWTH ESCHERICHIA COLI   Proteus mirabilis - MIC*    AMPICILLIN >=32 RESISTANT Resistant     CEFTAZIDIME <=1 SENSITIVE Sensitive     CEFAZOLIN 8 SENSITIVE Sensitive     CEFTRIAXONE <=1 SENSITIVE Sensitive     CIPROFLOXACIN <=0.25 SENSITIVE Sensitive     GENTAMICIN <=1 SENSITIVE Sensitive     IMIPENEM 1 SENSITIVE Sensitive     TRIMETH/SULFA <=20 SENSITIVE Sensitive     * PROTEUS MIRABILIS   Enterococcus species - MIC*    AMPICILLIN >=32 RESISTANT Resistant     VANCOMYCIN >=32 RESISTANT Resistant     GENTAMICIN SYNERGY SENSITIVE Sensitive     TETRACYCLINE Value in next row Resistant      RESISTANT>=16    * HEAVY GROWTH ENTEROCOCCUS SPECIES  MRSA PCR Screening     Status: None   Collection Time: 07/25/2015  2:38 PM  Result Value Ref Range Status   MRSA by PCR NEGATIVE NEGATIVE Final    Comment:        The GeneXpert MRSA Assay (FDA approved for NASAL specimens only), is one component of a comprehensive MRSA colonization surveillance program. It is not intended to diagnose MRSA infection nor to guide or monitor treatment for MRSA infections.   Blood culture (single)     Status: None   Collection Time: 07/28/2015  3:36 PM  Result Value Ref Range Status   Specimen Description BLOOD RIGHT ASSIST CONTROL  Final   Special  Requests BOTTLES DRAWN AEROBIC AND ANAEROBIC  Final   Culture NO GROWTH 5 DAYS  Final   Report Status 08/17/2015 FINAL  Final  Wound culture     Status: None   Collection Time: 08/13/15 12:37 PM  Result Value Ref Range Status  Specimen Description WOUND  Final   Special Requests Normal  Final   Gram Stain   Final    FEW WBC SEEN TOO NUMEROUS TO COUNT GRAM NEGATIVE RODS FEW GRAM POSITIVE COCCI    Culture   Final    HEAVY GROWTH ESCHERICHIA COLI MODERATE GROWTH PROTEUS MIRABILIS LIGHT GROWTH KLEBSIELLA PNEUMONIAE MODERATE GROWTH ENTEROCOCCUS GALLINARUM CRITICAL RESULT CALLED TO, READ BACK BY AND VERIFIED WITH: [facility] BORBA AT 1042 08/16/15 DV    Report Status 08/17/2015 FINAL  Final   Organism ID, Bacteria ESCHERICHIA COLI  Final   Organism ID, Bacteria PROTEUS MIRABILIS  Final   Organism ID, Bacteria KLEBSIELLA PNEUMONIAE  Final   Organism ID, Bacteria ENTEROCOCCUS GALLINARUM  Final      Susceptibility   Escherichia coli - MIC*    AMPICILLIN >=32 RESISTANT Resistant     CEFTAZIDIME 4 RESISTANT Resistant     CEFAZOLIN >=64 RESISTANT Resistant     CEFTRIAXONE 16 RESISTANT Resistant     GENTAMICIN 2 SENSITIVE Sensitive     IMIPENEM >=16 RESISTANT Resistant     TRIMETH/SULFA <=20 SENSITIVE Sensitive     Extended ESBL POSITIVE Resistant     PIP/TAZO Value in next row Resistant      RESISTANT>=128    CIPROFLOXACIN Value in next row Sensitive      SENSITIVE<=0.25    * HEAVY GROWTH ESCHERICHIA COLI   Klebsiella pneumoniae - MIC*    AMPICILLIN Value in next row Resistant      SENSITIVE<=0.25    CEFTAZIDIME Value in next row Resistant      SENSITIVE<=0.25    CEFAZOLIN Value in next row Resistant      SENSITIVE<=0.25    CEFTRIAXONE Value in next row Resistant      SENSITIVE<=0.25    CIPROFLOXACIN Value in next row Resistant      SENSITIVE<=0.25    GENTAMICIN Value in next row Sensitive      SENSITIVE<=0.25    IMIPENEM Value in next row Resistant      SENSITIVE<=0.25     TRIMETH/SULFA Value in next row Resistant      SENSITIVE<=0.25    PIP/TAZO Value in next row Resistant      RESISTANT>=128    * LIGHT GROWTH KLEBSIELLA PNEUMONIAE   Proteus mirabilis - MIC*    AMPICILLIN Value in next row Resistant      RESISTANT>=128    CEFTAZIDIME Value in next row Sensitive      RESISTANT>=128    CEFAZOLIN Value in next row Sensitive      RESISTANT>=128    CEFTRIAXONE Value in next row Sensitive      RESISTANT>=128    CIPROFLOXACIN Value in next row Sensitive      RESISTANT>=128    GENTAMICIN Value in next row Sensitive      RESISTANT>=128    IMIPENEM Value in next row Sensitive      RESISTANT>=128    TRIMETH/SULFA Value in next row Sensitive      RESISTANT>=128    PIP/TAZO Value in next row Sensitive      SENSITIVE<=4    * MODERATE GROWTH PROTEUS MIRABILIS   Enterococcus gallinarum - MIC*    AMPICILLIN Value in next row Resistant      SENSITIVE<=4    GENTAMICIN SYNERGY Value in next row Sensitive      SENSITIVE<=4    CIPROFLOXACIN Value in next row Resistant      RESISTANT>=8    TETRACYCLINE Value in next row Resistant  RESISTANT>=16    * MODERATE GROWTH ENTEROCOCCUS GALLINARUM  Wound culture     Status: None   Collection Time: 08/15/15  2:53 PM  Result Value Ref Range Status   Specimen Description WOUND  Final   Special Requests NONE  Final   Gram Stain FEW WBC SEEN NO ORGANISMS SEEN   Final   Culture NO GROWTH 3 DAYS  Final   Report Status 08/18/2015 FINAL  Final  C difficile quick scan w PCR reflex     Status: None   Collection Time: 08/17/15 11:34 AM  Result Value Ref Range Status   C Diff antigen NEGATIVE NEGATIVE Final   C Diff toxin NEGATIVE NEGATIVE Final   C Diff interpretation Negative for C. difficile  Final  Stool culture     Status: None   Collection Time: 09/03/15  3:51 PM  Result Value Ref Range Status   Specimen Description STOOL  Final   Special Requests Immunocompromised  Final   Culture   Final    NO SALMONELLA OR  SHIGELLA ISOLATED No Pathogenic E. coli detected NO CAMPYLOBACTER DETECTED    Report Status 09/07/2015 FINAL  Final  C difficile quick scan w PCR reflex     Status: None   Collection Time: 09/13/15 12:51 AM  Result Value Ref Range Status   C Diff antigen NEGATIVE NEGATIVE Final   C Diff toxin NEGATIVE NEGATIVE Final   C Diff interpretation Negative for C. difficile  Final  Culture, blood (routine x 2)     Status: None   Collection Time: 09/16/15 11:24 AM  Result Value Ref Range Status   Specimen Description BLOOD LEFT HAND  Final   Special Requests BOTTLES DRAWN AEROBIC AND ANAEROBIC  1CC  Final   Culture NO GROWTH 5 DAYS  Final   Report Status 09/21/2015 FINAL  Final  Culture, blood (routine x 2)     Status: None   Collection Time: 09/16/15 12:23 PM  Result Value Ref Range Status   Specimen Description BLOOD RIGHT HAND  Final   Special Requests BOTTLES DRAWN AEROBIC AND ANAEROBIC  1CC  Final   Culture  Setup Time   Final    GRAM POSITIVE COCCI AEROBIC BOTTLE ONLY CRITICAL RESULT CALLED TO, READ BACK BY AND VERIFIED WITH: TESS THOMAS,RN 09/17/2015 0631 BY JRS.    Culture   Final    ENTEROCOCCUS FAECALIS AEROBIC BOTTLE ONLY VRE HAVE INTRINSIC RESISTANCE TO MOST COMMONLY USED ANTIBIOTICS AND THE ABILITY TO ACQUIRE RESISTANCE TO MOST AVAILABLE ANTIBIOTICS. CRITICAL RESULT CALLED TO, READ BACK BY AND VERIFIED WITH: CHERYL SMITH AT 1610 09/19/15 DV    Report Status 09/21/2015 FINAL  Final   Organism ID, Bacteria ENTEROCOCCUS FAECALIS  Final      Susceptibility   Enterococcus faecalis - MIC*    AMPICILLIN <=2 SENSITIVE Sensitive     LINEZOLID 2 SENSITIVE Sensitive     CIPROFLOXACIN Value in next row Resistant      RESISTANT>=8    TETRACYCLINE Value in next row Resistant      RESISTANT>=16    VANCOMYCIN Value in next row Resistant      RESISTANT>=32    GENTAMICIN SYNERGY Value in next row Resistant      RESISTANT>=32    * ENTEROCOCCUS FAECALIS  Culture, respiratory  (NON-Expectorated)     Status: None   Collection Time: 09/16/15  3:50 PM  Result Value Ref Range Status   Specimen Description TRACHEAL ASPIRATE  Final   Special Requests Immunocompromised  Final   Gram Stain   Final    FEW WBC SEEN GOOD SPECIMEN - 80-90% WBCS RARE GRAM NEGATIVE RODS    Culture LIGHT GROWTH PSEUDOMONAS AERUGINOSA  Final   Report Status 09/19/2015 FINAL  Final   Organism ID, Bacteria PSEUDOMONAS AERUGINOSA  Final      Susceptibility   Pseudomonas aeruginosa - MIC*    CEFTAZIDIME 8 SENSITIVE Sensitive     CIPROFLOXACIN 2 INTERMEDIATE Intermediate     GENTAMICIN >=16 RESISTANT Resistant     IMIPENEM >=16 RESISTANT Resistant     PIP/TAZO Value in next row Sensitive      SENSITIVE32    CEFEPIME Value in next row Sensitive      SENSITIVE8    LEVOFLOXACIN Value in next row Resistant      RESISTANT>=8    * LIGHT GROWTH PSEUDOMONAS AERUGINOSA  Culture, expectorated sputum-assessment     Status: None   Collection Time: 09/22/15  2:09 PM  Result Value Ref Range Status   Specimen Description ENDOTRACHEAL  Final   Special Requests Normal  Final   Sputum evaluation THIS SPECIMEN IS ACCEPTABLE FOR SPUTUM CULTURE  Final   Report Status 09/24/2015 FINAL  Final  Culture, respiratory (NON-Expectorated)     Status: None   Collection Time: 09/22/15  2:09 PM  Result Value Ref Range Status   Specimen Description ENDOTRACHEAL  Final   Special Requests Normal Reflexed from [id number]  Final   Gram Stain   Final    FEW WBC SEEN FEW GRAM NEGATIVE RODS POOR SPECIMEN - LESS THAN 70% WBCS    Culture   Final    MODERATE GROWTH PSEUDOMONAS AERUGINOSA LIGHT GROWTH KLEBSIELLA PNEUMONIAE REFER TO SENSITIVITIES FROM PREVIOUS CULTURE FOR ORG 2    Report Status 10/01/2015 FINAL  Final   Organism ID, Bacteria PSEUDOMONAS AERUGINOSA  Final   Organism ID, Bacteria KLEBSIELLA PNEUMONIAE  Final      Susceptibility   Pseudomonas aeruginosa - MIC*    CEFTAZIDIME 4 SENSITIVE Sensitive      CIPROFLOXACIN 2 INTERMEDIATE Intermediate     GENTAMICIN >=16 RESISTANT Resistant     IMIPENEM >=16 RESISTANT Resistant     PIP/TAZO Value in next row Sensitive      SENSITIVE16    * MODERATE GROWTH PSEUDOMONAS AERUGINOSA  Tissue culture     Status: None   Collection Time: 09/24/15  6:44 AM  Result Value Ref Range Status   Specimen Description BONE  Final   Special Requests Normal  Final   Gram Stain MODERATE WBC SEEN FEW GRAM NEGATIVE RODS   Final   Culture   Final    MODERATE GROWTH ESCHERICHIA COLI MODERATE GROWTH KLEBSIELLA PNEUMONIAE ESBL-EXTENDED SPECTRUM BETA LACTAMASE-THE ORGANISM IS RESISTANT TO PENICILLINS, CEPHALOSPORINS AND AZTREONAM ACCORDING TO CLSI M100-S15 VOL.25 N01 JAN 2005. ORGANISM 1 This organism isolate is resistant to one or more antiotic agents in three or more antimicrobial categories.  Suggest Infectious Disease consult.   ORGANISM 2 CRITICAL RESULT CALLED TO, READ BACK BY AND VERIFIED WITH: RN Mardene Celeste LINDSAY 09/27/15 1005AM    Report Status 09/28/2015 FINAL  Final   Organism ID, Bacteria ESCHERICHIA COLI  Final   Organism ID, Bacteria KLEBSIELLA PNEUMONIAE  Final      Susceptibility   Escherichia coli - MIC*    AMPICILLIN >=32 RESISTANT Resistant     CEFTAZIDIME 4 RESISTANT Resistant     CEFAZOLIN >=64 RESISTANT Resistant     CEFTRIAXONE 8 RESISTANT Resistant     GENTAMICIN <=1  SENSITIVE Sensitive     IMIPENEM 8 RESISTANT Resistant     TRIMETH/SULFA <=20 SENSITIVE Sensitive     Extended ESBL POSITIVE Resistant     PIP/TAZO Value in next row Resistant      RESISTANT>=128    * MODERATE GROWTH ESCHERICHIA COLI   Klebsiella pneumoniae - MIC*    AMPICILLIN Value in next row Resistant      RESISTANT>=128    CEFTAZIDIME Value in next row Resistant      RESISTANT>=128    CEFAZOLIN Value in next row Resistant      RESISTANT>=128    CEFTRIAXONE Value in next row Resistant      RESISTANT>=128    CIPROFLOXACIN Value in next row Resistant       RESISTANT>=128    GENTAMICIN Value in next row Resistant      RESISTANT>=128    IMIPENEM Value in next row Resistant      RESISTANT>=128    TRIMETH/SULFA Value in next row Sensitive      RESISTANT>=128    PIP/TAZO Value in next row Resistant      RESISTANT>=128    * MODERATE GROWTH KLEBSIELLA PNEUMONIAE  Anaerobic culture     Status: None   Collection Time: 09/24/15  3:55 PM  Result Value Ref Range Status   Specimen Description BONE  Final   Special Requests Normal  Final   Culture NO ANAEROBES ISOLATED  Final   Report Status 09/29/2015 FINAL  Final  Culture, fungus without smear     Status: None   Collection Time: 09/24/15  3:55 PM  Result Value Ref Range Status   Specimen Description BONE  Final   Special Requests Normal  Final   Culture NO FUNGUS ISOLATED AFTER 24 DAYS  Final   Report Status 10/18/2015 FINAL  Final  C difficile quick scan w PCR reflex     Status: None   Collection Time: 10/07/15  2:02 PM  Result Value Ref Range Status   C Diff antigen NEGATIVE NEGATIVE Final   C Diff toxin NEGATIVE NEGATIVE Final   C Diff interpretation Negative for C. difficile  Final  Culture, blood (routine x 2)     Status: None   Collection Time: 10/16/15  9:52 AM  Result Value Ref Range Status   Specimen Description BLOOD LEFT HAND  Final   Special Requests   Final    BOTTLES DRAWN AEROBIC AND ANAEROBIC  AER 6CC ANA 4CC   Culture NO GROWTH 6 DAYS  Final   Report Status 10/22/2015 FINAL  Final  Culture, blood (routine x 2)     Status: None   Collection Time: 10/16/15 12:25 PM  Result Value Ref Range Status   Specimen Description BLOOD LEFT HAND  Final   Special Requests BOTTLES DRAWN AEROBIC AND ANAEROBIC  5CC  Final   Culture NO GROWTH 6 DAYS  Final   Report Status 10/22/2015 FINAL  Final  Culture, expectorated sputum-assessment     Status: None   Collection Time: 10/18/15  1:15 PM  Result Value Ref Range Status   Specimen Description SPUTUM  Final   Special Requests NONE   Final   Sputum evaluation THIS SPECIMEN IS ACCEPTABLE FOR SPUTUM CULTURE  Final   Report Status 10/18/2015 FINAL  Final  Culture, respiratory (NON-Expectorated)     Status: None   Collection Time: 10/18/15  1:15 PM  Result Value Ref Range Status   Specimen Description SPUTUM  Final   Special Requests NONE Reflexed from  [id number]  Final   Gram Stain   Final    GOOD SPECIMEN - 80-90% WBCS MODERATE WBC SEEN MANY GRAM NEGATIVE RODS    Culture   Final    HEAVY GROWTH PSEUDOMONAS AERUGINOSA MODERATE GROWTH KLEBSIELLA PNEUMONIAE CRITICAL RESULT CALLED TO, READ BACK BY AND VERIFIED WITHRod Mae: JOANNA LINDSAY AT [id number] 10/21/15 DV KLEBSIELLA PNEUMONIAE This organism isolate is resistant to one or more antiotic agents in three or more antimicrobial categories.  Suggest Infectious Disease  consult.      Report Status 10/22/2015 FINAL  Final   Organism ID, Bacteria PSEUDOMONAS AERUGINOSA  Final   Organism ID, Bacteria KLEBSIELLA PNEUMONIAE  Final      Susceptibility   Klebsiella pneumoniae - MIC*    AMPICILLIN >=32 RESISTANT Resistant     CEFAZOLIN >=64 RESISTANT Resistant     CEFTRIAXONE >=64 RESISTANT Resistant     CIPROFLOXACIN >=4 RESISTANT Resistant     GENTAMICIN >=16 RESISTANT Resistant     IMIPENEM >=16 RESISTANT Resistant     NITROFURANTOIN >=512 RESISTANT Resistant     TRIMETH/SULFA <=20 SENSITIVE Sensitive     * MODERATE GROWTH KLEBSIELLA PNEUMONIAE   Pseudomonas aeruginosa - MIC*    CEFTAZIDIME 4 SENSITIVE Sensitive     CIPROFLOXACIN >=4 RESISTANT Resistant     GENTAMICIN 8 INTERMEDIATE Intermediate     IMIPENEM >=16 RESISTANT Resistant     PIP/TAZO Value in next row Sensitive      SENSITIVE32    * HEAVY GROWTH PSEUDOMONAS AERUGINOSA  C difficile quick scan w PCR reflex     Status: None   Collection Time: 10/18/15  4:39 PM  Result Value Ref Range Status   C Diff antigen NEGATIVE NEGATIVE Final   C Diff toxin NEGATIVE NEGATIVE Final   C Diff interpretation Negative for C. difficile   Final    Coagulation Studies: No results for input(s): LABPROT, INR in the last 72 hours.  Urinalysis: No results for input(s): COLORURINE, LABSPEC, PHURINE, GLUCOSEU, HGBUR, BILIRUBINUR, KETONESUR, PROTEINUR, UROBILINOGEN, NITRITE, LEUKOCYTESUR in the last 72 hours.  Invalid input(s): APPERANCEUR    Imaging: Dg Abd 1 View  11/04/2015  CLINICAL DATA:  Feeding tube placement EXAM: ABDOMEN - 1 VIEW COMPARISON:  September 25, 2015 FINDINGS: Feeding tube tip is in the body of the stomach. The bowel gas pattern is normal. There is extensive intraperitoneal barium, a stable finding. Sclerotic lesions are identified in the iliac crests, stable. IMPRESSION: Feeding tube tip in body of stomach. Areas of intraperitoneal barium are consistent with prior bowel perforation. Sclerotic areas in the bony structures 9 potentially be due to long-term chronic renal failure. Bony metastatic disease is a differential consideration, however. Electronically Signed   By: Bretta BangWilliam  Woodruff III M.D.   On: 11/04/2015 10:24   [place] Venous Img Lower Bilateral  11/03/2015  CLINICAL DATA:  Swelling for 3 months. EXAM: BILATERAL LOWER EXTREMITY VENOUS DOPPLER ULTRASOUND TECHNIQUE: Gray-scale sonography with graded compression, as well as color Doppler and duplex ultrasound were performed to evaluate the lower extremity deep venous systems from the level of the common femoral vein and including the common femoral, femoral, profunda femoral, popliteal and calf veins including the posterior tibial, peroneal and gastrocnemius veins when visible. The superficial great saphenous vein was also interrogated. Spectral Doppler was utilized to evaluate flow at rest and with distal augmentation maneuvers in the common femoral, femoral and popliteal veins. COMPARISON:  08/28/2015 FINDINGS: RIGHT LOWER EXTREMITY Normal compressibility and color Doppler flow in the right common femoral vein.  The right profunda femoral vein is patent without  thrombus. There is a small amount of echogenic material along the wall of the proximal right femoral vein but this vessel appears to be compressible. This area is difficult to evaluate due to body habitus. Normal compressibility and color Doppler flow in the remainder of the right femoral vein and the right popliteal vein. Limited evaluation of the deep calf veins due to body habitus but no clear evidence for thrombus. LEFT LOWER EXTREMITY Normal compressibility at the left common femoral vein and left saphenofemoral junction without thrombus. Normal compressibility in the proximal left femoral vein. Limited evaluation of the mid and distal left femoral vein due to body habitus. Normal compressibility in the left popliteal vein without thrombus. Limited evaluation of the calf veins due to body habitus but no clear evidence for thrombus. There is color Doppler flow in the left common femoral vein, profunda femoral vein and visualized left femoral vein. Normal color Doppler flow and augmentation in left popliteal vein. Other Findings:  None. IMPRESSION: Technically challenging examination due to body habitus. No evidence for an acute DVT in either lower extremity. A small amount of echogenic material along the wall of the proximal right femoral vein is indeterminate. This could represent old nonocclusive thrombus. Electronically Signed   By: Richarda Overlie M.D.   On: 11/03/2015 12:56     Medications:   . dextrose 5 % and 0.9% NaCl 25 mL/hr at 11/05/15 0600  . feeding supplement (JEVITY 1.5 CAL/FIBER) Stopped (11/04/15 0930)  . norepinephrine Stopped (10/08/15 0530)   . amantadine  200 mg Per Tube Q7 days  . antiseptic oral rinse  7 mL Mouth Rinse QID  . aspirin  81 mg Oral Daily  . chlorhexidine gluconate  15 mL Mouth Rinse BID  . epoetin (EPOGEN/PROCRIT) injection  10,000 Units Intravenous Q M,W,F-HD  . escitalopram  10 mg Oral Daily  . feeding supplement (PRO-STAT SUGAR FREE 64)  30 mL Oral 6 X Daily  .  folic acid  1 mg Oral Daily  . free water  20 mL Per Tube 6 times per day  . heparin subcutaneous  5,000 Units Subcutaneous Q12H  . hydrocortisone  10 mg Per Tube BID  . insulin aspart  0-9 Units Subcutaneous 6 times per day  . lidocaine  1 patch Transdermal Q24H  . loperamide  2 mg Oral BID  . midodrine  5 mg Per Tube TID WC  . mirtazapine  7.5 mg Oral QHS  . multivitamin  5 mL Oral Daily  . pantoprazole sodium  40 mg Per Tube Daily  . saccharomyces boulardii  250 mg Oral BID  . sodium chloride  10-40 mL Intracatheter Q12H  . sodium hypochlorite   Irrigation BID  . sulfamethoxazole-trimethoprim  2.5 tablet Per Tube q1800  . thiamine  100 mg Per Tube Daily   sodium chloride, sodium chloride, acetaminophen (TYLENOL) oral liquid 160 mg/5 mL, HYDROmorphone (DILAUDID) injection, ipratropium-albuterol, morphine injection, ondansetron (ZOFRAN) IV, oxyCODONE, sodium chloride, zinc oxide  Assessment/ Plan:  49 y.o. 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 Admitted on 2015-08-14  1. End-stage renal disease on hemodialysis on HD MWF. The patient has been on dialysis since October of 2014. R IJ permcath. Dialysis treatment with IV albumin for oncotic support. Midodrine and hydrocortisone ordered for blood pressure support.  - Continue MWF schedule. Treatment yesterday. Tolerated treatment well. Monitor daily for dialysis need.  Next treatment scheduled for Monday.   2. Anemia of CKD. Hemoglobin 7.3.  - Continue scheduled Epogen with dialysis.  - Low threshold for dialysis  3. Secondary hyperparathyroidism: PTH 50, phosphorus and calcium at goal.  - no indication for vitamin D agents  4. Sepsis: VRE in blood. Bone cultures E. Coli and klebsiella - Treatment as per ID and ICU team: daptomycin completed on 11/10 and now on high dose trimethoprim/sulfamethoxazole.   5. Acute resp failure:  -Patient remains on the ventilator at this point in time. FiO2 40%. - appreciate pulmonary input.    LOS: 85 Courtney [name] 12/17/20169:08 AM

## 2015-11-05 NOTE — Progress Notes (Addendum)
[facility] Critical Care Medicine Progess Note  Name: Courtney [name] MRN: [id number] DOB: 1966/07/22    ADMISSION DATE:  Sep 01, 2015   INITIAL PRESENTATION:  49 AAF who has been in medical facilities (hosp, LTAC, rehab) for 2 yrs following gastric bypass surgery with multiple complications. Now with chronic trach, ESRD, profound debilitation, severe sacral pressure ulcer. Was seemingly making progress and transferred to rehab facility approx one week prior to this admission. She was sent to [facility] ED with AMS and hypotension. Working dx of severe sepsis/septic shock due to infected sacral pressure ulcer. Since admission. Her course is been very complicated with numerous complications including septic shock and GI bleeding. Now with failure to wean from vent    INTERVAL HISTORY: Tube feeds held yesterday due to large amount of residuals, and actually vomiting tube feeds. Plan to restart tube feeds today and a half rate and increase by 5 mL per 8 hours as tolerated. Awake, alert, following commands this morning Review of notes overnight shows that she had mild episodes of hypoglycemia secondary to tube feeds me., Was started on low-dose D 5, given small bolus yesterday also for mild hypertension.      Regarding PEG tube-husband states that she does have a complex GI anatomy and the obstacle in the past was finding a surgeon suitable to place an NG tube,   HD as needed per nephrology [place] LE neg for DVT  Summary of MAJOR EVENTS/TEST RESULTS: Admission 02/07/14-05/07/14 Admission 07/21/14-09/06/14 Discharged to [facility]. Pt had palliative consult at that time, were asked to sign off by husband.  09/23 CT head: NAD 09/23 EEG: no epileptiform activity 09/23 PRBCs for Hgb 6.4 09/24 bedside debridement of sacral wound. Abscess drained 09/25 Off vasopressors. More alert. No distress. Worsening thrombocytopenia. Vanc DC'd 09/29  Dr. Sampson Goon (I.D) excused from the case by patient's  husband. 10/02 MRI -multiple infarcts 10/03 tracheal bleeding- transfused platelets 10/03 hospitalist service excused from the case by patient's husband 10/03 Echocardiogram ejection fraction was 55-60%, pulmonary systolic pressure was 39 mmHg 78/29 restart TF's at lower rate, attempt reg HD  10/12 Transferred to med-surg floor. Remains on PCCM service 10/14 SLP eval: pt unable to tolerate PMV adequately 10/17-will re-attempt PM valve-discussed with Speech therapist 10/18 passed swallow eval-start pureed thick foods no thin liquids-continue NG feeds 10/19 transferred to step down for sepsis/aspiration pneumonia 10/19 cxr shows RLL opacity 10/21 sacral decub debride at bedside by surgery 10/22 started back on vasopressors while on HD 10/27 CT with osteo, R hip fx, unable to identify tip of dubhoff tube - sent for fluoro study 10/28 Ortho consultation: I do not feel that she is a surgical candidate. Therefore, I feel that it would best to manage this fracture nonsurgically and allow it to heal by itself over time, which it should.  10/28 Gen Surg consultation: Due to the lack of free air or free flow of contrast and the peritoneum there is no indication for any surgical intervention on this. Would recommend pulling back the feeding tube 1-2 cm. Would recheck an abdominal film to confirm no pneumoperitoneum in the morning. Absence of any changes okay to continue using Dobbhoff for feeding and medications. 10/28 gastrograffin study: The study confirms that the feeding tube pes perforated through the duodenum and the tip is within a cavity that fills with injected contrast. The cavity does appear walled off 11/4 refuses oral feeds, continue TF's CT reviewed: T8-T9 discitis/osteomyelitis, RT HIP FRACTURE 11/5 placed back on vasopressors, placed back on Vent  due to resp acidosis, levophed turned off 11/6 afternoon 11/5 PM Trach changed out due to cuff leak, #6 Shiley cuffed, 11/6 dubhoff occluded,  removed and replaced, new dubhoff shows tube in the antral stomach 11/15 refusing to take oral feeds 11/18 placed back on Vent for increased WOB,SOB. 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 10/18/2015; the patient developed a new left basal pneumonia; with recurrent sepsis.  10/19/2015; fevers resolved with IV acetaminophen. Tube feeds changed from vital to Jevity.  12/7 CXR with stable b/l opacities.  12/12 elevated K s/p HD-remains on AC mode 12/16-vomiting-hold feeds and re-assess in next 24 hrs 12/17- TF restarted at 1/2 goal (goal = 50cc/per)  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 Pneumoniae, P. Mirabilis, VRE Wound 9/26 >> No growth Blood 9/23 >> NEG CDiff 9/27>>neg Stool Cx 10/15>> negative Cdiff 10/25>>neg Trach Aspirate 10/28>> light growth pseudomonas Blood 10/28 >> 1/2 GPC >>  Sputum cultures obtained 09/22/15 due to mucus plugging>>Pseudomonas BONE TISSUE Cx 11/3>>E.coli (ESBL), k. Pneumonia (daptomycin and septra) Bld Cx 11/27>>negative thus far.  Trach Asp 11/27>> grew out Pseudomonas, and Klebsiella pneumonia.  ANTIMICROBIALS:  Aztreonam 9/23 >> 9/24 Vanc 9/23 >> 9/25 Vanc 9/26>>9/27 Daptomycin 9/27>> 10/12, 10/28>> off Meropenem 9/23 >> 10/14, 10/28 >> 10/31 Septra 11/8>> Zosyn 11/27,>> 11/30. Levaquin 11/29>> 11/29. Ceftaz 12/1>>12/11 Bactrim - chronic  VITAL SIGNS: Temp:  [97.2 F (36.2 C)-99.5 F (37.5 C)] 99.5 F (37.5 C) (12/17 0744) Pulse Rate:  [85-101] 89 (12/17 0700) Resp:  [18-31] 25 (12/17 0700) BP:  (63-123)/(25-109) 117/56 mmHg (12/17 0700) SpO2:  [92 %-100 %] 93 % (12/17 0700) FiO2 (%):  [30 %] 30 % (12/17 0809) Weight:  [177 lb 11.1 oz (80.6 kg)] 177 lb 11.1 oz (80.6 kg) (12/17 0206) HEMODYNAMICS:   VENTILATOR SETTINGS: Vent Mode:  [-] PRVC FiO2 (%):  [30 %] 30 % Set Rate:  [14 bmp] 14 bmp Vt Set:  [530 mL] 530 mL PEEP:  [5 cmH20] 5 cmH20 INTAKE / OUTPUT:  Intake/Output Summary (Last 24 hours) at 11/05/15 1001 Last data filed at 11/05/15 0700  Gross per 24 hour  Intake 833.33 ml  Output    739 ml  Net  94.33 ml   PHYSICAL EXAMINATION: Physical Examination:   VS: BP 117/56 mmHg  Pulse 89  Temp(Src) 99.5 F (37.5 C) (Axillary)  Resp 25  Ht  (1.753 m)  Wt 177 lb 11.1 oz (80.6 kg)  BMI 26.23 kg/m2  SpO2 93%  LMP  (LMP Unknown)  General Appearance: No resp distress, coarse upper airway sounds, on vent Neuro: profoundly diffusely weak, alert today, awake HEENT: cushingoid facies, PERRLA, EOM intact Neck: trach site clean Pulmonary: clear anteriorly Cardiovascular: reg, + syst murmur Abdomen: soft, NT, +BS Extremities: warm, R>L UE edema +sacral wound    LABORATORY PANEL:   CBC  Recent Labs Lab 11/04/15 0548  WBC 15.7*  HGB 7.3*  HCT 24.1*  PLT 213    Chemistries   Recent Labs Lab 11/04/15 0548  NA 145  K 5.0  CL 103  CO2 33*  GLUCOSE 132*  BUN  76*  CREATININE 1.31*  CALCIUM 8.5*  MG 2.1  PHOS 4.5       IMPRESSION/PLAN (current major issues):  Patient with controlled pain today, nodding heading and more comfortable   Recurrent sepsis, due to pneumonia/sacral wound with chronic osteo of sacrum and  spine  Anemia with macrocytosis -Uncertain if this is due to chronic slow GI bleed or simple critical care associated anemia. She has already receiving folate, thiamine, and multivitamin. Her anemia has not responded to oral medications, this may be due to her GI malabsorption.  Pneumonia with sepsis-remains on vent  support -Chest x-ray shows new left lower lobe infiltrate,which is now improving.  Sputum cultures show pseudomonas. The patient is once again, vent dependent. She has developed again recurrent sepsis.Marland Kitchen. Sputum cultures showing Pseudomonas, and Klebsiella, currently covered with this. Ceftaz total 10 days, stop date 12/11. She is also on chronic Bactrim   Worsening sacral decubitus ulcer, stage IV. With spine involvement -The patient is already had a sacral bone scraping, and has been placed on Septra for antibiotic, per ID recommendations. Her sacral decubitus does not appear to be improving and in fact appears to be worsening. In addition, she has several new smaller areas of tissue breakdown.  End-stage renal disease. -Continue on hemodialysis per nephrology.  Diarrhea. -loperamide once daily scheduled   Trach Care -changed on 11/5 to #6 Shiley due to cuff leak  GI- vomiting on 12/16, TF held, restart low dose TF @25cc  on 12/17, adv by 5cc q8hrs to goal (50cc) as tolerated.   Patient appears to be stable for transfer to an LTAC facility at this point in time.   Additional Chronic issues and complicating factors during this admission.  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  Thrombocytopenia, resolved - HIT panel negative 9/25 Recurrent Severe sepsis, due to sacral pressure ulcer DM 2, controlled.  Chronic steroid therapy, for a history of of adrenal insufficiency Incidental finding of R hip fx - conservative mgmt. Waxing and waning encephalopathy - she was evaluated by both the psychiatry and neurology services, and it was deemed that the patient lacks decisional capacity at this time. 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 Husband declined TEE Profound deconditioning-criticall illness neuropathy Chronic pain PNEUMONIA  Left VM for husband  The Patient requires high complexity decision making for assessment and  support, frequent evaluation and titration of therapies, application of advanced monitoring technologies and extensive interpretation of multiple databases. Critical Care Time devoted to patient care services described in this note is 35 minutes.   Overall, patient is critically ill, prognosis is guarded.  Patient with Multiorgan failure and at high risk for cardiac arrest and death.   Stephanie AcreVishal Alexcia Schools, MD [place] [facility] Pager [phone] (please enter 7-digits) On Call Pager - [phone] (please enter 7-digits)

## 2015-11-06 ENCOUNTER — Inpatient Hospital Stay: Payer: 59

## 2015-11-06 LAB — EXPECTORATED SPUTUM ASSESSMENT W REFEX TO RESP CULTURE

## 2015-11-06 LAB — CBC
HEMATOCRIT: 21.2 % — AB (ref 35.0–47.0)
Hemoglobin: 6.3 g/dL — ABNORMAL LOW (ref 12.0–16.0)
MCH: 29.4 pg (ref 26.0–34.0)
MCHC: 29.9 g/dL — ABNORMAL LOW (ref 32.0–36.0)
MCV: 98.2 fL (ref 80.0–100.0)
PLATELETS: 208 10*3/uL (ref 150–440)
RBC: 2.16 MIL/uL — ABNORMAL LOW (ref 3.80–5.20)
RDW: 19.3 % — AB (ref 11.5–14.5)
WBC: 15.7 10*3/uL — AB (ref 3.6–11.0)

## 2015-11-06 LAB — EXPECTORATED SPUTUM ASSESSMENT W GRAM STAIN, RFLX TO RESP C

## 2015-11-06 LAB — GLUCOSE, CAPILLARY
GLUCOSE-CAPILLARY: 88 mg/dL (ref 65–99)
GLUCOSE-CAPILLARY: 103 mg/dL — AB (ref 65–99)
Glucose-Capillary: 91 mg/dL (ref 65–99)
Glucose-Capillary: 76 mg/dL (ref 65–99)
Glucose-Capillary: 81 mg/dL (ref 65–99)
Glucose-Capillary: 103 mg/dL — ABNORMAL HIGH (ref 65–99)

## 2015-11-06 LAB — PREPARE RBC (CROSSMATCH)

## 2015-11-06 MED ORDER — SODIUM CHLORIDE 0.9 % IV SOLN
Freq: Once | INTRAVENOUS | Status: AC
  Administered 2015-11-06: 11:00:00 via INTRAVENOUS

## 2015-11-06 MED ORDER — DEXTROSE 5 % IV SOLN
1.0000 g | INTRAVENOUS | Status: DC
  Administered 2015-11-06 – 2015-11-12 (×7): 1 g via INTRAVENOUS
  Filled 2015-11-06 (×8): qty 1

## 2015-11-06 NOTE — Progress Notes (Signed)
PT Cancellation Note  Patient Details Name: Courtney [name] MRN: [id number] DOB: 03-10-1966   Cancelled Treatment:    Reason Eval/Treat Not Completed: Patient's level of consciousness Spoke with husband who requests to hold PT today as she had a long night and "she needs to rest today."  Loran SentersGalen Wrigley Winborne, PT, DPT [phone]  Malachi ProGalen R Ajene Carchi 11/06/2015, 2:58 PM

## 2015-11-06 NOTE — Progress Notes (Signed)
OT Cancellation Note  Patient Details Name: Courtney GrinderHedda McMillian [name] MRN: [id number] DOB: [birth year]   Cancelled Treatment:    Reason Eval/Treat Not Completed: Patient declined, no reason specified.  [name] declined OT today stating "she needs a break today." Emphasized need to place RUE and hand on pillow which he did and reviewed edema massage.  Continue OT as pt is able.    Carli Lefevers   [name], OTR/L ascom [phone]  11/06/2015, 1:33 PM

## 2015-11-06 NOTE — Progress Notes (Signed)
[name]  Name: Courtney GrinderHedda McMillian [name] MRN: [id number] DOB: 12-23-1965    ADMISSION DATE:  2015-09-19   INITIAL PRESENTATION:  49 AAF who has been in medical facilities (hosp, LTAC, rehab) for 2 yrs following gastric bypass surgery with multiple complications. Now with chronic trach, ESRD, profound debilitation, severe sacral pressure ulcer. Was seemingly making progress and transferred to rehab facility approx one week prior to this admission. She was sent to [facility] ED with AMS and hypotension. Working dx of severe sepsis/septic shock due to infected sacral pressure ulcer. Since admission. Her course is been very complicated with numerous complications including septic shock and GI bleeding. Now with failure to wean from vent    INTERVAL HISTORY: Overnight had 2 episodes of fever, MAXIMUM TEMPERATURE 101.4, tube feeds restarted yesterday currently at 40 mL/h Still getting D5 normal saline at 25 mL per hour Hemoglobin down to less than 7, we'll be transfuse 1 unit of packed red blood cells Overall still with good interaction and following simple commands, also noted to have increase in white blood cell count. Blood, respiratory cultures reordered and patient will be started back on ceftaz again, due to recent history of pseudomonal and Klebsiella pneumonia.      Regarding PEG tube-husband states that she does have a complex GI anatomy and the obstacle in the past was finding a surgeon suitable to place an NG tube,   HD as needed per nephrology US LE neg for DVT  Summary of MAJOR EVENTS/TEST RESULTS: Admission 02/07/14-05/07/14 Admission 07/21/14-09/06/14 Discharged to [facility]. Pt had palliative consult at that time, were asked to sign off by husband.  09/23 CT head: NAD 09/23 EEG: no epileptiform activity 09/23 PRBCs for Hgb 6.4 09/24 bedside debridement of sacral wound. Abscess drained 09/25 Off vasopressors. More alert. No distress. Worsening  thrombocytopenia. Vanc DC'd 09/29  Dr. [name] (I.D) excused from the case by patient's husband. 10/02 MRI -multiple infarcts 10/03 tracheal bleeding- transfused platelets 10/03 hospitalist service excused from the case by patient's husband 10/03 Echocardiogram ejection fraction was 55-60%, pulmonary systolic pressure was 39 mmHg 40/9810/08 restart TF's at lower rate, attempt reg HD  10/12 Transferred to med-surg floor. Remains on PCCM service 10/14 SLP eval: pt unable to tolerate PMV adequately 10/17-will re-attempt PM valve-discussed with Speech therapist 10/18 passed swallow eval-start pureed thick foods no thin liquids-continue NG feeds 10/19 transferred to step down for sepsis/aspiration pneumonia 10/19 cxr shows RLL opacity 10/21 sacral decub debride at bedside by surgery 10/22 started back on vasopressors while on HD 10/27 CT with osteo, R hip fx, unable to identify tip of dubhoff tube - sent for fluoro study 10/28 Ortho consultation: I do not feel that she is a surgical candidate. Therefore, I feel that it would best to manage this fracture nonsurgically and allow it to heal by itself over time, which it should.  10/28 Gen Surg consultation: Due to the lack of free air or free flow of contrast and the peritoneum there is no indication for any surgical intervention on this. Would recommend pulling back the feeding tube 1-2 cm. Would recheck an abdominal film to confirm no pneumoperitoneum in the morning. Absence of any changes okay to continue using Dobbhoff for feeding and medications. 10/28 gastrograffin study: The study confirms that the feeding tube pes perforated through the duodenum and the tip is within a cavity that fills with injected contrast. The cavity does appear walled off 11/4 refuses oral feeds, continue TF's CT reviewed: T8-T9 discitis/osteomyelitis,  RT HIP FRACTURE 11/5 placed back on vasopressors, placed back on Vent due to resp acidosis, levophed turned off 11/6  afternoon 11/5 PM Trach changed out due to cuff leak, #6 Shiley cuffed, 11/6 dubhoff occluded, removed and replaced, new dubhoff shows tube in the antral stomach 11/15 refusing to take oral feeds 11/18 placed back on Vent for increased WOB,SOB. 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 10/18/2015; the patient developed a new left basal pneumonia; with recurrent sepsis.  10/19/2015; fevers resolved with IV acetaminophen. Tube feeds changed from vital to Jevity.  12/7 CXR with stable b/l opacities.  12/12 elevated K s/p HD-remains on AC mode 12/16-vomiting-hold feeds and re-assess in next 24 hrs 12/17- TF restarted at 1/2 goal (goal = 50cc/per) 12/18- elevated wbc, fever (101.4), recultured, restarted on ceftaz  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 Pneumoniae, P. Mirabilis, VRE Wound 9/26 >> No growth Blood 9/23 >> NEG CDiff 9/27>>neg Stool Cx 10/15>> negative Cdiff 10/25>>neg Trach Aspirate 10/28>> light growth pseudomonas Blood 10/28 >> 1/2 GPC >>  Sputum cultures obtained 09/22/15 due to mucus plugging>>Pseudomonas BONE TISSUE Cx 11/3>>E.coli (ESBL), k. Pneumonia (daptomycin and septra) Bld Cx 11/27>>negative thus far.  Trach Asp 11/27>> grew out Pseudomonas, and Klebsiella pneumonia. Trach Asp 12/18>> Bld Cx 12/18>>  ANTIMICROBIALS:  Aztreonam 9/23 >> 9/24 Vanc 9/23 >> 9/25 Vanc 9/26>>9/27 Daptomycin 9/27>> 10/12, 10/28>> off Meropenem 9/23 >> 10/14, 10/28 >> 10/31 Septra 11/8>> Zosyn 11/27,>> 11/30. Levaquin 11/29>> 11/29. Ceftaz  12/1>>12/11, 12/18>> Bactrim - chronic  VITAL SIGNS: Temp:  [98.8 F (37.1 C)-101.4 F (38.6 C)] 101.1 F (38.4 C) (12/18 0700) Pulse Rate:  [82-98] 95 (12/18 0833) Resp:  [10-31] 30 (12/18 0833) BP: (81-128)/(37-74) 93/58 mmHg (12/18 0833) SpO2:  [89 %-99 %] 92 % (12/18 0833) FiO2 (%):  [30 %-50 %] 40 % (12/18 0833) Weight:  [177 lb 11.1 oz (80.6 kg)] 177 lb 11.1 oz (80.6 kg) (12/18 0357) HEMODYNAMICS:   VENTILATOR SETTINGS: Vent Mode:  [-] Spontaneous FiO2 (%):  [30 %-50 %] 40 % Set Rate:  [14 bmp] 14 bmp Vt Set:  [530 mL] 530 mL PEEP:  [5 cmH20] 5 cmH20 Plateau Pressure:  [18 cmH20] 18 cmH20 INTAKE / OUTPUT:  Intake/Output Summary (Last 24 hours) at 11/06/15 0847 Last data filed at 11/06/15 0700  Gross per 24 hour  Intake    980 ml  Output      0 ml  Net    980 ml   PHYSICAL EXAMINATION: Physical Examination:   VS: BP 93/58 mmHg  Pulse 95  Temp(Src) 101.1 F (38.4 C) (Axillary)  Resp 30  Ht  (1.753 m)  Wt 177 lb 11.1 oz (80.6 kg)  BMI 26.23 kg/m2  SpO2 92%  LMP  (LMP Unknown)  General Appearance: No resp distress, coarse upper airway sounds, on vent Neuro: profoundly diffusely weak, alert today, awake HEENT: cushingoid facies, PERRLA, EOM intact Neck: trach site clean.  Thick greenish secretions from suctioning.  Pulmonary: clear anteriorly Cardiovascular: reg, + syst murmur Abdomen: soft, NT, +BS Extremities: warm, R>L UE edema +sacral wound  LABORATORY PANEL:   CBC  Recent Labs Lab 11/06/15 0819  WBC 15.7*  HGB 6.3*  HCT 21.2*  PLT 208    Chemistries   Recent Labs Lab 11/04/15 0548  NA 145  K 5.0  CL 103  CO2 33*  GLUCOSE 132*  BUN 76*  CREATININE 1.31*  CALCIUM 8.5*  MG 2.1  PHOS 4.5       IMPRESSION/PLAN (current major issues):  Patient with controlled pain today, following simple commands, interactive.   Recurrent sepsis, due to pneumonia/sacral wound with chronic osteo of sacrum and  spine  Anemia with  macrocytosis -Uncertain if this is due to chronic slow GI bleed or simple critical care associated anemia. She has already receiving folate, thiamine, and multivitamin. Her anemia has not responded to oral medications, this may be due to her GI malabsorption. - Hb 6.3>>1 u PRBC today  Pneumonia with sepsis-remains on vent support -Chest x-ray shows new left lower lobe infiltrate,which is now improving.  Sputum cultures show pseudomonas. The patient is once again, vent dependent. She has developed again recurrent sepsis.Marland Kitchen Sputum cultures showing Pseudomonas, and Klebsiella, currently covered with this. Ceftaz total 10 days, stop date 12/11. She is also on chronic Bactrim  12/18 - noted to have fever, elevated WBC, recultured and restarted on ceftaz CXR essentially unchanged from 1 week ago  Worsening sacral decubitus ulcer, stage IV. With spine involvement -The patient is already had a sacral bone scraping, and has been placed on Septra for antibiotic, per ID recommendations. Her sacral decubitus does not appear to be improving and in fact appears to be worsening. In addition, she has several new smaller areas of tissue breakdown.  End-stage renal disease. -Continue on hemodialysis per nephrology.  Diarrhea. -loperamide once daily scheduled   Trach Care -changed on 11/5 to #6 Shiley due to cuff leak  GI- vomiting on 12/16, TF held, restart low dose TF  on 12/17, adv by 5cc q8hrs to goal (50cc) as tolerated.   Patient appears to be stable for transfer to an LTAC facility at this point in time.   Additional Chronic issues and complicating factors during this admission.  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  Thrombocytopenia, resolved - HIT panel negative 9/25 Recurrent Severe sepsis, due to sacral pressure ulcer DM 2, controlled.  Chronic steroid therapy, for a history of of adrenal insufficiency Incidental finding of R hip fx -  conservative mgmt. Waxing and waning encephalopathy - she was evaluated by both the psychiatry and neurology services, and it was deemed that the patient lacks decisional capacity at this time. 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 Husband declined TEE Profound deconditioning-criticall illness neuropathy Chronic pain PNEUMONIA  Updated Husband via telephone.   The Patient requires high complexity decision making for assessment and support, frequent evaluation and titration of therapies, application of advanced monitoring technologies and extensive interpretation of multiple databases. Critical Care Time devoted to patient care services described in this note is 35 minutes.   Overall, patient is critically ill, prognosis is guarded.  Patient with Multiorgan failure and at high risk for cardiac arrest and death.   Stephanie Acre, MD [place] [facility] Pager [phone] (please enter 7-digits) On Call Pager - [phone] (please enter 7-digits)

## 2015-11-06 NOTE — Plan of Care (Signed)
Problem: Phase I Progression Outcomes Goal: Progress activity as tolerated unless otherwise ordered Outcome: Not Progressing Bedrest. Moves left arm purposefully and to command. Turned frequently in bed with max assist. Seen by PT for leg exercises. Goal: Discharge plan established Outcome: Not Progressing See [facility] notes Goal: Tolerating diet Outcome: Progressing Jevity Tube feed tolerated and increased 775ml/ 8 hrs.  Currently at 1140ml/hr. On moderate loose stool  Problem: Phase II Progression Outcomes Goal: Dyspnea controlled w/progressive activity Outcome: Progressing No dyspnea. Tracheal secretions yellow/tan thick.  Fevers past 12hrs. Tracheal aspirate and blood cultures done.  IV fortaz added. Goal: Activity at appropriate level-compared to baseline (UP IN CHAIR FOR HEMODIALYSIS)  Outcome: Progressing At baseline Goal: Other Phase II Outcomes/Goals Outcome: Progressing Hgb 6.3. One unit packed cells given.

## 2015-11-06 NOTE — Progress Notes (Signed)
Subjective:   Hypotensive this morning. Tmax 101.4 PRVC 40%   No family at bedside.    Objective:  Vital signs in last 24 hours:  Temp:  [98.8 F (37.1 C)-101.4 F (38.6 C)] 101.1 F (38.4 C) (12/18 0700) Pulse Rate:  [82-98] 95 (12/18 0833) Resp:  [10-31] 30 (12/18 0833) BP: (81-128)/(37-74) 93/58 mmHg (12/18 0833) SpO2:  [89 %-99 %] 92 % (12/18 0833) FiO2 (%):  [30 %-50 %] 40 % (12/18 0833) Weight:  [80.6 kg (177 lb 11.1 oz)] 80.6 kg (177 lb 11.1 oz) (12/18 0357)  Weight change: 0 kg (0 lb) Filed Weights   11/04/15 1000 11/05/15 0206 11/06/15 0357  Weight: 80.6 kg (177 lb 11.1 oz) 80.6 kg (177 lb 11.1 oz) 80.6 kg (177 lb 11.1 oz)    Intake/Output: I/O last 3 completed shifts: In: 1338.3 [I.V.:522.1; Other:100; NG/GT:716.3] Out: 739 [Other:739]   Intake/Output this shift:     Physical Exam: General: critically ill appearing  Head/ENT: OM moist, NG tube  Eyes: Anicteric, eyes open  Neck: Tracheostomy in place  Lungs:  Scattered rhonchi, vent assisted  PRVC fio2 40%  Heart: S1S2 no rubs  Abdomen:  Soft, nontender, BS present  Extremities: 2+ dependent edema, anasarca  Neurologic: Lethargic but arousable today, not following commands  Access:  R IJ permcath.       Basic Metabolic Panel:  Recent Labs Lab 10/30/15 1213 10/31/15 0448 11/01/15 1109 11/04/15 0548  NA  --  144 147* 145  K 5.6* 6.0* 5.2* 5.0  CL  --  101 102 103  CO2  --  34* 35* 33*  GLUCOSE  --  109* 126* 132*  BUN  --  92* 60* 76*  CREATININE  --  1.43* 1.11* 1.31*  CALCIUM  --  8.7* 8.8* 8.5*  MG  --   --  2.1 2.1  PHOS  --  5.2* 4.3 4.5    Liver Function Tests:  Recent Labs Lab 10/31/15 0448  ALBUMIN 2.0*   No results for input(s): LIPASE, AMYLASE in the last 168 hours. No results for input(s): AMMONIA in the last 168 hours.  CBC:  Recent Labs Lab 10/31/15 0448 11/01/15 1109 11/04/15 0548  WBC 9.4 11.9* 15.7*  HGB 7.7* 8.0* 7.3*  HCT 24.4* 26.4* 24.1*  MCV 95.9  97.4 98.2  PLT 257 250 213    Cardiac Enzymes: No results for input(s): CKTOTAL, CKMB, CKMBINDEX, TROPONINI in the last 168 hours.  BNP: Invalid input(s): POCBNP  CBG:  Recent Labs Lab 11/05/15 1057 11/05/15 1619 11/05/15 2051 11/06/15 0049 11/06/15 0355  GLUCAP 85 101* 79 91 88    Microbiology: Results for orders placed or performed during the hospital encounter of 2015/08/26  Blood Culture (routine x 2)     Status: None   Collection Time: 08-26-2015  8:51 AM  Result Value Ref Range Status   Specimen Description BLOOD [name]  Final   Special Requests BOTTLES DRAWN AEROBIC AND ANAEROBIC  3CC  Final   Culture NO GROWTH 5 DAYS  Final   Report Status 08/17/2015 FINAL  Final  Blood Culture (routine x 2)     Status: None   Collection Time: 08-26-2015  9:20 AM  Result Value Ref Range Status   Specimen Description BLOOD LEFT ARM  Final   Special Requests BOTTLES DRAWN AEROBIC AND ANAEROBIC  1CC  Final   Culture NO GROWTH 5 DAYS  Final   Report Status 08/17/2015 FINAL  Final  Wound culture  Status: None   Collection Time: 07/25/2015  9:20 AM  Result Value Ref Range Status   Specimen Description DECUBITIS  Final   Special Requests Normal  Final   Gram Stain   Final    FEW WBC SEEN MANY GRAM NEGATIVE RODS RARE GRAM POSITIVE COCCI    Culture   Final    HEAVY GROWTH ESCHERICHIA COLI MODERATE GROWTH ENTEROBACTER AEROGENES PROTEUS MIRABILIS HEAVY GROWTH ENTEROCOCCUS SPECIES VRE HAVE INTRINSIC RESISTANCE TO MOST COMMONLY USED ANTIBIOTICS AND THE ABILITY TO ACQUIRE RESISTANCE TO MOST AVAILABLE ANTIBIOTICS.    Report Status 08/16/2015 FINAL  Final   Organism ID, Bacteria ESCHERICHIA COLI  Final   Organism ID, Bacteria ENTEROBACTER AEROGENES  Final   Organism ID, Bacteria PROTEUS MIRABILIS  Final   Organism ID, Bacteria ENTEROCOCCUS SPECIES  Final      Susceptibility   Enterobacter aerogenes - MIC*    CEFTAZIDIME <=1 SENSITIVE Sensitive     CEFAZOLIN >=64 RESISTANT Resistant      CEFTRIAXONE <=1 SENSITIVE Sensitive     CIPROFLOXACIN <=0.25 SENSITIVE Sensitive     GENTAMICIN <=1 SENSITIVE Sensitive     IMIPENEM 1 SENSITIVE Sensitive     TRIMETH/SULFA <=20 SENSITIVE Sensitive     * MODERATE GROWTH ENTEROBACTER AEROGENES   Escherichia coli - MIC*    AMPICILLIN <=2 SENSITIVE Sensitive     CEFTAZIDIME <=1 SENSITIVE Sensitive     CEFAZOLIN <=4 SENSITIVE Sensitive     CEFTRIAXONE <=1 SENSITIVE Sensitive     CIPROFLOXACIN <=0.25 SENSITIVE Sensitive     GENTAMICIN <=1 SENSITIVE Sensitive     IMIPENEM <=0.25 SENSITIVE Sensitive     TRIMETH/SULFA <=20 SENSITIVE Sensitive     * HEAVY GROWTH ESCHERICHIA COLI   Proteus mirabilis - MIC*    AMPICILLIN >=32 RESISTANT Resistant     CEFTAZIDIME <=1 SENSITIVE Sensitive     CEFAZOLIN 8 SENSITIVE Sensitive     CEFTRIAXONE <=1 SENSITIVE Sensitive     CIPROFLOXACIN <=0.25 SENSITIVE Sensitive     GENTAMICIN <=1 SENSITIVE Sensitive     IMIPENEM 1 SENSITIVE Sensitive     TRIMETH/SULFA <=20 SENSITIVE Sensitive     * PROTEUS MIRABILIS   Enterococcus species - MIC*    AMPICILLIN >=32 RESISTANT Resistant     VANCOMYCIN >=32 RESISTANT Resistant     GENTAMICIN SYNERGY SENSITIVE Sensitive     TETRACYCLINE Value in next row Resistant      RESISTANT>=16    * HEAVY GROWTH ENTEROCOCCUS SPECIES  MRSA PCR Screening     Status: None   Collection Time: 08/11/2015  2:38 PM  Result Value Ref Range Status   MRSA by PCR NEGATIVE NEGATIVE Final    Comment:        The GeneXpert MRSA Assay (FDA approved for NASAL specimens only), is one component of a comprehensive MRSA colonization surveillance program. It is not intended to diagnose MRSA infection nor to guide or monitor treatment for MRSA infections.   Blood culture (single)     Status: None   Collection Time: 08/14/2015  3:36 PM  Result Value Ref Range Status   Specimen Description BLOOD RIGHT ASSIST CONTROL  Final   Special Requests BOTTLES DRAWN AEROBIC AND ANAEROBIC  Final    Culture NO GROWTH 5 DAYS  Final   Report Status 08/17/2015 FINAL  Final  Wound culture     Status: None   Collection Time: 08/13/15 12:37 PM  Result Value Ref Range Status   Specimen Description WOUND  Final   Special  Requests Normal  Final   Gram Stain   Final    FEW WBC SEEN TOO NUMEROUS TO COUNT GRAM NEGATIVE RODS FEW GRAM POSITIVE COCCI    Culture   Final    HEAVY GROWTH ESCHERICHIA COLI MODERATE GROWTH PROTEUS MIRABILIS LIGHT GROWTH KLEBSIELLA PNEUMONIAE MODERATE GROWTH ENTEROCOCCUS GALLINARUM CRITICAL RESULT CALLED TO, READ BACK BY AND VERIFIED WITH: [facility] BORBA AT 1042 08/16/15 DV    Report Status 08/17/2015 FINAL  Final   Organism ID, Bacteria ESCHERICHIA COLI  Final   Organism ID, Bacteria PROTEUS MIRABILIS  Final   Organism ID, Bacteria KLEBSIELLA PNEUMONIAE  Final   Organism ID, Bacteria ENTEROCOCCUS GALLINARUM  Final      Susceptibility   Escherichia coli - MIC*    AMPICILLIN >=32 RESISTANT Resistant     CEFTAZIDIME 4 RESISTANT Resistant     CEFAZOLIN >=64 RESISTANT Resistant     CEFTRIAXONE 16 RESISTANT Resistant     GENTAMICIN 2 SENSITIVE Sensitive     IMIPENEM >=16 RESISTANT Resistant     TRIMETH/SULFA <=20 SENSITIVE Sensitive     Extended ESBL POSITIVE Resistant     PIP/TAZO Value in next row Resistant      RESISTANT>=128    CIPROFLOXACIN Value in next row Sensitive      SENSITIVE<=0.25    * HEAVY GROWTH ESCHERICHIA COLI   Klebsiella pneumoniae - MIC*    AMPICILLIN Value in next row Resistant      SENSITIVE<=0.25    CEFTAZIDIME Value in next row Resistant      SENSITIVE<=0.25    CEFAZOLIN Value in next row Resistant      SENSITIVE<=0.25    CEFTRIAXONE Value in next row Resistant      SENSITIVE<=0.25    CIPROFLOXACIN Value in next row Resistant      SENSITIVE<=0.25    GENTAMICIN Value in next row Sensitive      SENSITIVE<=0.25    IMIPENEM Value in next row Resistant      SENSITIVE<=0.25    TRIMETH/SULFA Value in next row Resistant       SENSITIVE<=0.25    PIP/TAZO Value in next row Resistant      RESISTANT>=128    * LIGHT GROWTH KLEBSIELLA PNEUMONIAE   Proteus mirabilis - MIC*    AMPICILLIN Value in next row Resistant      RESISTANT>=128    CEFTAZIDIME Value in next row Sensitive      RESISTANT>=128    CEFAZOLIN Value in next row Sensitive      RESISTANT>=128    CEFTRIAXONE Value in next row Sensitive      RESISTANT>=128    CIPROFLOXACIN Value in next row Sensitive      RESISTANT>=128    GENTAMICIN Value in next row Sensitive      RESISTANT>=128    IMIPENEM Value in next row Sensitive      RESISTANT>=128    TRIMETH/SULFA Value in next row Sensitive      RESISTANT>=128    PIP/TAZO Value in next row Sensitive      SENSITIVE<=4    * MODERATE GROWTH PROTEUS MIRABILIS   Enterococcus gallinarum - MIC*    AMPICILLIN Value in next row Resistant      SENSITIVE<=4    GENTAMICIN SYNERGY Value in next row Sensitive      SENSITIVE<=4    CIPROFLOXACIN Value in next row Resistant      RESISTANT>=8    TETRACYCLINE Value in next row Resistant      RESISTANT>=16    * MODERATE  GROWTH ENTEROCOCCUS GALLINARUM  Wound culture     Status: None   Collection Time: 08/15/15  2:53 PM  Result Value Ref Range Status   Specimen Description WOUND  Final   Special Requests NONE  Final   Gram Stain FEW WBC SEEN NO ORGANISMS SEEN   Final   Culture NO GROWTH 3 DAYS  Final   Report Status 08/18/2015 FINAL  Final  C difficile quick scan w PCR reflex     Status: None   Collection Time: 08/17/15 11:34 AM  Result Value Ref Range Status   C Diff antigen NEGATIVE NEGATIVE Final   C Diff toxin NEGATIVE NEGATIVE Final   C Diff interpretation Negative for C. difficile  Final  Stool culture     Status: None   Collection Time: 09/03/15  3:51 PM  Result Value Ref Range Status   Specimen Description STOOL  Final   Special Requests Immunocompromised  Final   Culture   Final    NO SALMONELLA OR SHIGELLA ISOLATED No Pathogenic E. coli  detected NO CAMPYLOBACTER DETECTED    Report Status 09/07/2015 FINAL  Final  C difficile quick scan w PCR reflex     Status: None   Collection Time: 09/13/15 12:51 AM  Result Value Ref Range Status   C Diff antigen NEGATIVE NEGATIVE Final   C Diff toxin NEGATIVE NEGATIVE Final   C Diff interpretation Negative for C. difficile  Final  Culture, blood (routine x 2)     Status: None   Collection Time: 09/16/15 11:24 AM  Result Value Ref Range Status   Specimen Description BLOOD LEFT HAND  Final   Special Requests BOTTLES DRAWN AEROBIC AND ANAEROBIC  1CC  Final   Culture NO GROWTH 5 DAYS  Final   Report Status 09/21/2015 FINAL  Final  Culture, blood (routine x 2)     Status: None   Collection Time: 09/16/15 12:23 PM  Result Value Ref Range Status   Specimen Description BLOOD RIGHT HAND  Final   Special Requests BOTTLES DRAWN AEROBIC AND ANAEROBIC  1CC  Final   Culture  Setup Time   Final    GRAM POSITIVE COCCI AEROBIC BOTTLE ONLY CRITICAL RESULT CALLED TO, READ BACK BY AND VERIFIED WITH: TESS THOMAS,RN 09/17/2015 0631 BY JRS.    Culture   Final    ENTEROCOCCUS FAECALIS AEROBIC BOTTLE ONLY VRE HAVE INTRINSIC RESISTANCE TO MOST COMMONLY USED ANTIBIOTICS AND THE ABILITY TO ACQUIRE RESISTANCE TO MOST AVAILABLE ANTIBIOTICS. CRITICAL RESULT CALLED TO, READ BACK BY AND VERIFIED WITH: CHERYL SMITH AT 1610 09/19/15 DV    Report Status 09/21/2015 FINAL  Final   Organism ID, Bacteria ENTEROCOCCUS FAECALIS  Final      Susceptibility   Enterococcus faecalis - MIC*    AMPICILLIN <=2 SENSITIVE Sensitive     LINEZOLID 2 SENSITIVE Sensitive     CIPROFLOXACIN Value in next row Resistant      RESISTANT>=8    TETRACYCLINE Value in next row Resistant      RESISTANT>=16    VANCOMYCIN Value in next row Resistant      RESISTANT>=32    GENTAMICIN SYNERGY Value in next row Resistant      RESISTANT>=32    * ENTEROCOCCUS FAECALIS  Culture, respiratory (NON-Expectorated)     Status: None   Collection  Time: 09/16/15  3:50 PM  Result Value Ref Range Status   Specimen Description TRACHEAL ASPIRATE  Final   Special Requests Immunocompromised  Final   Gram Stain  Final    FEW WBC SEEN GOOD SPECIMEN - 80-90% WBCS RARE GRAM NEGATIVE RODS    Culture LIGHT GROWTH PSEUDOMONAS AERUGINOSA  Final   Report Status 09/19/2015 FINAL  Final   Organism ID, Bacteria PSEUDOMONAS AERUGINOSA  Final      Susceptibility   Pseudomonas aeruginosa - MIC*    CEFTAZIDIME 8 SENSITIVE Sensitive     CIPROFLOXACIN 2 INTERMEDIATE Intermediate     GENTAMICIN >=16 RESISTANT Resistant     IMIPENEM >=16 RESISTANT Resistant     PIP/TAZO Value in next row Sensitive      SENSITIVE32    CEFEPIME Value in next row Sensitive      SENSITIVE8    LEVOFLOXACIN Value in next row Resistant      RESISTANT>=8    * LIGHT GROWTH PSEUDOMONAS AERUGINOSA  Culture, expectorated sputum-assessment     Status: None   Collection Time: 09/22/15  2:09 PM  Result Value Ref Range Status   Specimen Description ENDOTRACHEAL  Final   Special Requests Normal  Final   Sputum evaluation THIS SPECIMEN IS ACCEPTABLE FOR SPUTUM CULTURE  Final   Report Status 09/24/2015 FINAL  Final  Culture, respiratory (NON-Expectorated)     Status: None   Collection Time: 09/22/15  2:09 PM  Result Value Ref Range Status   Specimen Description ENDOTRACHEAL  Final   Special Requests Normal Reflexed from [id number]  Final   Gram Stain   Final    FEW WBC SEEN FEW GRAM NEGATIVE RODS POOR SPECIMEN - LESS THAN 70% WBCS    Culture   Final    MODERATE GROWTH PSEUDOMONAS AERUGINOSA LIGHT GROWTH KLEBSIELLA PNEUMONIAE REFER TO SENSITIVITIES FROM PREVIOUS CULTURE FOR ORG 2    Report Status 10/01/2015 FINAL  Final   Organism ID, Bacteria PSEUDOMONAS AERUGINOSA  Final   Organism ID, Bacteria KLEBSIELLA PNEUMONIAE  Final      Susceptibility   Pseudomonas aeruginosa - MIC*    CEFTAZIDIME 4 SENSITIVE Sensitive     CIPROFLOXACIN 2 INTERMEDIATE Intermediate      GENTAMICIN >=16 RESISTANT Resistant     IMIPENEM >=16 RESISTANT Resistant     PIP/TAZO Value in next row Sensitive      SENSITIVE16    * MODERATE GROWTH PSEUDOMONAS AERUGINOSA  Tissue culture     Status: None   Collection Time: 09/24/15  6:44 AM  Result Value Ref Range Status   Specimen Description BONE  Final   Special Requests Normal  Final   Gram Stain MODERATE WBC SEEN FEW GRAM NEGATIVE RODS   Final   Culture   Final    MODERATE GROWTH ESCHERICHIA COLI MODERATE GROWTH KLEBSIELLA PNEUMONIAE ESBL-EXTENDED SPECTRUM BETA LACTAMASE-THE ORGANISM IS RESISTANT TO PENICILLINS, CEPHALOSPORINS AND AZTREONAM ACCORDING TO CLSI M100-S15 VOL.25 N01 JAN 2005. ORGANISM 1 This organism isolate is resistant to one or more antiotic agents in three or more antimicrobial categories.  Suggest Infectious Disease consult.   ORGANISM 2 CRITICAL RESULT CALLED TO, READ BACK BY AND VERIFIED WITH: RN Mardene CelesteJOANNA LINDSAY 09/27/15 1005AM    Report Status 09/28/2015 FINAL  Final   Organism ID, Bacteria ESCHERICHIA COLI  Final   Organism ID, Bacteria KLEBSIELLA PNEUMONIAE  Final      Susceptibility   Escherichia coli - MIC*    AMPICILLIN >=32 RESISTANT Resistant     CEFTAZIDIME 4 RESISTANT Resistant     CEFAZOLIN >=64 RESISTANT Resistant     CEFTRIAXONE 8 RESISTANT Resistant     GENTAMICIN <=1 SENSITIVE Sensitive     IMIPENEM  8 RESISTANT Resistant     TRIMETH/SULFA <=20 SENSITIVE Sensitive     Extended ESBL POSITIVE Resistant     PIP/TAZO Value in next row Resistant      RESISTANT>=128    * MODERATE GROWTH ESCHERICHIA COLI   Klebsiella pneumoniae - MIC*    AMPICILLIN Value in next row Resistant      RESISTANT>=128    CEFTAZIDIME Value in next row Resistant      RESISTANT>=128    CEFAZOLIN Value in next row Resistant      RESISTANT>=128    CEFTRIAXONE Value in next row Resistant      RESISTANT>=128    CIPROFLOXACIN Value in next row Resistant      RESISTANT>=128    GENTAMICIN Value in next row Resistant       RESISTANT>=128    IMIPENEM Value in next row Resistant      RESISTANT>=128    TRIMETH/SULFA Value in next row Sensitive      RESISTANT>=128    PIP/TAZO Value in next row Resistant      RESISTANT>=128    * MODERATE GROWTH KLEBSIELLA PNEUMONIAE  Anaerobic culture     Status: None   Collection Time: 09/24/15  3:55 PM  Result Value Ref Range Status   Specimen Description BONE  Final   Special Requests Normal  Final   Culture NO ANAEROBES ISOLATED  Final   Report Status 09/29/2015 FINAL  Final  Culture, fungus without smear     Status: None   Collection Time: 09/24/15  3:55 PM  Result Value Ref Range Status   Specimen Description BONE  Final   Special Requests Normal  Final   Culture NO FUNGUS ISOLATED AFTER 24 DAYS  Final   Report Status 10/18/2015 FINAL  Final  C difficile quick scan w PCR reflex     Status: None   Collection Time: 10/07/15  2:02 PM  Result Value Ref Range Status   C Diff antigen NEGATIVE NEGATIVE Final   C Diff toxin NEGATIVE NEGATIVE Final   C Diff interpretation Negative for C. difficile  Final  Culture, blood (routine x 2)     Status: None   Collection Time: 10/16/15  9:52 AM  Result Value Ref Range Status   Specimen Description BLOOD LEFT HAND  Final   Special Requests   Final    BOTTLES DRAWN AEROBIC AND ANAEROBIC  AER 6CC ANA 4CC   Culture NO GROWTH 6 DAYS  Final   Report Status 10/22/2015 FINAL  Final  Culture, blood (routine x 2)     Status: None   Collection Time: 10/16/15 12:25 PM  Result Value Ref Range Status   Specimen Description BLOOD LEFT HAND  Final   Special Requests BOTTLES DRAWN AEROBIC AND ANAEROBIC  5CC  Final   Culture NO GROWTH 6 DAYS  Final   Report Status 10/22/2015 FINAL  Final  Culture, expectorated sputum-assessment     Status: None   Collection Time: 10/18/15  1:15 PM  Result Value Ref Range Status   Specimen Description SPUTUM  Final   Special Requests NONE  Final   Sputum evaluation THIS SPECIMEN IS ACCEPTABLE FOR  SPUTUM CULTURE  Final   Report Status 10/18/2015 FINAL  Final  Culture, respiratory (NON-Expectorated)     Status: None   Collection Time: 10/18/15  1:15 PM  Result Value Ref Range Status   Specimen Description SPUTUM  Final   Special Requests NONE Reflexed from [id number]  Final   Gram Stain  Final    GOOD SPECIMEN - 80-90% WBCS MODERATE WBC SEEN MANY GRAM NEGATIVE RODS    Culture   Final    HEAVY GROWTH PSEUDOMONAS AERUGINOSA MODERATE GROWTH KLEBSIELLA PNEUMONIAE CRITICAL RESULT CALLED TO, READ BACK BY AND VERIFIED WITHRod Mae AT 1610 10/21/15 DV KLEBSIELLA PNEUMONIAE This organism isolate is resistant to one or more antiotic agents in three or more antimicrobial categories.  Suggest Infectious Disease  consult.      Report Status 10/22/2015 FINAL  Final   Organism ID, Bacteria PSEUDOMONAS AERUGINOSA  Final   Organism ID, Bacteria KLEBSIELLA PNEUMONIAE  Final      Susceptibility   Klebsiella pneumoniae - MIC*    AMPICILLIN >=32 RESISTANT Resistant     CEFAZOLIN >=64 RESISTANT Resistant     CEFTRIAXONE >=64 RESISTANT Resistant     CIPROFLOXACIN >=4 RESISTANT Resistant     GENTAMICIN >=16 RESISTANT Resistant     IMIPENEM >=16 RESISTANT Resistant     NITROFURANTOIN >=512 RESISTANT Resistant     TRIMETH/SULFA <=20 SENSITIVE Sensitive     * MODERATE GROWTH KLEBSIELLA PNEUMONIAE   Pseudomonas aeruginosa - MIC*    CEFTAZIDIME 4 SENSITIVE Sensitive     CIPROFLOXACIN >=4 RESISTANT Resistant     GENTAMICIN 8 INTERMEDIATE Intermediate     IMIPENEM >=16 RESISTANT Resistant     PIP/TAZO Value in next row Sensitive      SENSITIVE32    * HEAVY GROWTH PSEUDOMONAS AERUGINOSA  C difficile quick scan w PCR reflex     Status: None   Collection Time: 10/18/15  4:39 PM  Result Value Ref Range Status   C Diff antigen NEGATIVE NEGATIVE Final   C Diff toxin NEGATIVE NEGATIVE Final   C Diff interpretation Negative for C. difficile  Final    Coagulation Studies: No results for input(s):  LABPROT, INR in the last 72 hours.  Urinalysis: No results for input(s): COLORURINE, LABSPEC, PHURINE, GLUCOSEU, HGBUR, BILIRUBINUR, KETONESUR, PROTEINUR, UROBILINOGEN, NITRITE, LEUKOCYTESUR in the last 72 hours.  Invalid input(s): APPERANCEUR    Imaging: Dg Abd 1 View  11/04/2015  CLINICAL DATA:  Feeding tube placement EXAM: ABDOMEN - 1 VIEW COMPARISON:  September 25, 2015 FINDINGS: Feeding tube tip is in the body of the stomach. The bowel gas pattern is normal. There is extensive intraperitoneal barium, a stable finding. Sclerotic lesions are identified in the iliac crests, stable. IMPRESSION: Feeding tube tip in body of stomach. Areas of intraperitoneal barium are consistent with prior bowel perforation. Sclerotic areas in the bony structures 9 potentially be due to long-term chronic renal failure. Bony metastatic disease is a differential consideration, however. Electronically Signed   By: Bretta Bang III M.D.   On: 11/04/2015 10:24     Medications:   . dextrose 5 % and 0.9% NaCl 25 mL/hr at 11/06/15 0700  . feeding supplement (JEVITY 1.5 CAL/FIBER) 1,000 mL (11/05/15 1633)  . norepinephrine Stopped (10/08/15 0530)   . amantadine  200 mg Per Tube Q7 days  . antiseptic oral rinse  7 mL Mouth Rinse QID  . aspirin  81 mg Oral Daily  . chlorhexidine gluconate  15 mL Mouth Rinse BID  . epoetin (EPOGEN/PROCRIT) injection  10,000 Units Intravenous Q M,W,F-HD  . escitalopram  10 mg Oral Daily  . feeding supplement (PRO-STAT SUGAR FREE 64)  30 mL Oral 6 X Daily  . folic acid  1 mg Oral Daily  . free water  20 mL Per Tube 6 times per day  . heparin subcutaneous  5,000 Units Subcutaneous Q12H  . hydrocortisone  10 mg Per Tube BID  . insulin aspart  0-9 Units Subcutaneous 6 times per day  . lidocaine  1 patch Transdermal Q24H  . loperamide  2 mg Oral BID  . midodrine  5 mg Per Tube TID WC  . mirtazapine  7.5 mg Oral QHS  . multivitamin  5 mL Oral Daily  . pantoprazole sodium  40 mg  Per Tube Daily  . saccharomyces boulardii  250 mg Oral BID  . sodium chloride  10-40 mL Intracatheter Q12H  . sodium hypochlorite   Irrigation BID  . sulfamethoxazole-trimethoprim  2.5 tablet Per Tube q1800  . thiamine  100 mg Per Tube Daily   sodium chloride, sodium chloride, acetaminophen (TYLENOL) oral liquid 160 mg/5 mL, HYDROmorphone (DILAUDID) injection, ipratropium-albuterol, morphine injection, ondansetron (ZOFRAN) IV, oxyCODONE, polyvinyl alcohol, sodium chloride, zinc oxide  Assessment/ Plan:  49 y.o. 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 Admitted on 2015-08-18  1. End-stage renal disease on hemodialysis on HD MWF. The patient has been on dialysis since October of 2014. R IJ permcath. Dialysis treatment with IV albumin for oncotic support. Midodrine and hydrocortisone ordered for blood pressure support.  - Continue MWF schedule. Treatment for tomorrow. Monitor daily for dialysis need.   2. Anemia of CKD. Hemoglobin 6.3 - Continue scheduled Epogen with dialysis.  - Order 1 unit PRBC today.   3. Secondary hyperparathyroidism: PTH 50, phosphorus and calcium at goal.  - no indication for vitamin D agents  4. Sepsis: VRE in blood. Bone cultures E. Coli and klebsiella - again with fever, leukocytosis - Treatment as per ID and ICU team: daptomycin completed on 11/10 and now on high dose  trimethoprim/sulfamethoxazole. - Repeat sputum, blood cultures.    5. Acute resp failure:  -Patient remains on the ventilator at this point in time. FiO2 40%. - appreciate pulmonary input.    LOS: 86 Courtney [name] 12/18/20168:39 AM

## 2015-11-07 ENCOUNTER — Inpatient Hospital Stay (HOSPITAL_COMMUNITY)
Admit: 2015-11-07 | Discharge: 2015-11-07 | Disposition: A | Discharge status: Home / Self Care | Payer: 59 | Attending: Pulmonary Disease | Admitting: Pulmonary Disease

## 2015-11-07 DIAGNOSIS — R509 Fever, unspecified: Secondary | ICD-10-CM

## 2015-11-07 LAB — GLUCOSE, CAPILLARY
GLUCOSE-CAPILLARY: 104 mg/dL — AB (ref 65–99)
GLUCOSE-CAPILLARY: 99 mg/dL (ref 65–99)
GLUCOSE-CAPILLARY: 101 mg/dL — AB (ref 65–99)
Glucose-Capillary: 109 mg/dL — ABNORMAL HIGH (ref 65–99)
Glucose-Capillary: 113 mg/dL — ABNORMAL HIGH (ref 65–99)
Glucose-Capillary: 100 mg/dL — ABNORMAL HIGH (ref 65–99)

## 2015-11-07 LAB — CBC
HCT: 26 % — ABNORMAL LOW (ref 35.0–47.0)
HEMOGLOBIN: 7.9 g/dL — AB (ref 12.0–16.0)
MCH: 29.2 pg (ref 26.0–34.0)
MCHC: 30.4 g/dL — AB (ref 32.0–36.0)
MCV: 95.9 fL (ref 80.0–100.0)
Platelets: 209 10*3/uL (ref 150–440)
RBC: 2.71 MIL/uL — ABNORMAL LOW (ref 3.80–5.20)
RDW: 19.3 % — ABNORMAL HIGH (ref 11.5–14.5)
WBC: 14.7 10*3/uL — ABNORMAL HIGH (ref 3.6–11.0)

## 2015-11-07 LAB — RENAL FUNCTION PANEL
ANION GAP: 8 (ref 5–15)
Albumin: 1.9 g/dL — ABNORMAL LOW (ref 3.5–5.0)
BUN: 80 mg/dL — ABNORMAL HIGH (ref 6–20)
CO2: 34 mmol/L — AB (ref 22–32)
Calcium: 8.7 mg/dL — ABNORMAL LOW (ref 8.9–10.3)
Chloride: 104 mmol/L (ref 101–111)
Creatinine, Ser: 1.73 mg/dL — ABNORMAL HIGH (ref 0.44–1.00)
GFR calc non Af Amer: 33 mL/min — ABNORMAL LOW (ref >60)
GFR, EST AFRICAN AMERICAN: 39 mL/min — AB (ref >60)
GLUCOSE: 116 mg/dL — AB (ref 65–99)
PHOSPHORUS: 4.7 mg/dL — AB (ref 2.5–4.6)
POTASSIUM: 5 mmol/L (ref 3.5–5.1)
Sodium: 146 mmol/L — ABNORMAL HIGH (ref 135–145)

## 2015-11-07 LAB — TYPE AND SCREEN
ABO/RH(D): A NEG
Antibody Screen: NEGATIVE
UNIT DIVISION: 0

## 2015-11-07 MED ORDER — ALBUMIN HUMAN 25 % IV SOLN
12.5000 g | Freq: Once | INTRAVENOUS | Status: AC
  Administered 2015-11-07: 12.5 g via INTRAVENOUS
  Filled 2015-11-07: qty 50

## 2015-11-07 MED ORDER — ACETAMINOPHEN 160 MG/5ML PO SOLN
650.0000 mg | Freq: Four times a day (QID) | ORAL | Status: DC | PRN
  Administered 2015-11-11 – 2015-11-29 (×5): 650 mg
  Filled 2015-11-07 (×5): qty 20.3

## 2015-11-07 NOTE — Progress Notes (Signed)
Post hd tx 

## 2015-11-07 NOTE — Progress Notes (Signed)
Pre-hd tx 

## 2015-11-07 NOTE — Care Management (Signed)
Tube feeds were restarted 12/17 and rate is up to 40.   Temp uip to 101.4 12/18.  Cultures taken and IV Fortaz. Pericardial friction rib present.  Echo ordered today.  Awaiting results.

## 2015-11-07 NOTE — Plan of Care (Signed)
Problem: Phase I Progression Outcomes Goal: Progress activity as tolerated unless otherwise ordered Outcome: Not Progressing Bedrest. PROM x 4 extremities and AROM left arm.   Goal: Discharge plan established Outcome: Not Progressing See Care manager notes Goal: Tolerating diet Outcome: Progressing Tube feed at 145ml/hr.  Loose stool x1. No c/o nausea  Problem: Phase II Progression Outcomes Goal: Dyspnea controlled w/progressive activity Outcome: Progressing No dyspnea.  Tolerates bathing, dressing changes and linen changes without respiratory difficulty. Goal: ADLs completed with minimal assistance Outcome: Not Progressing Complete dependence with ADL's Goal: Activity at appropriate level-compared to baseline (UP IN CHAIR FOR HEMODIALYSIS)  Outcome: Not Progressing Activity has not progressed. Goal: Other Phase II Outcomes/Goals Outcome: Progressing HD in progress at this time.  BP tolerating treatment with albumin given. Pt on midodrine  Problem: Discharge Progression Outcomes Goal: Pain controlled with appropriate interventions Outcome: Progressing Pain controlled with Dilaudid prior to dressing change.

## 2015-11-07 NOTE — Progress Notes (Signed)
Stephanie AcreVishal Mungal, MD Physician Signed Critical Care Progress Notes 11/06/2015 8:47 AM    Expand All Collapse All    [facility] Progess Note  Name:Courtney [name] RUE:[id number]:[id number] DOB:[birth year]   ADMISSION DATE: 07/26/2015   INITIAL PRESENTATION:  49 AAF who has been in medical facilities (hosp, LTAC, rehab) for 2 yrs following gastric bypass surgery with multiple complications. Now with chronic trach, ESRD, profound debilitation, severe sacral pressure ulcer. Was seemingly making progress and transferred to rehab facility approx one week prior to this admission. She [facility] ED with AMS and hypotension. Working dx of severe sepsis/septic shock due to infected sacral pressure ulcer. Since admission. Her course is been very complicated with numerous complications including septic shock and GI bleeding. Now with failure to wean from vent    INTERVAL HISTORY: Overnight had 2 episodes of fever, MAXIMUM TEMPERATURE 101.4, tube feeds restarted yesterday currently at 40 mL/h Still getting D5 normal saline at 25 mL per hour Hemoglobin down to less than 7, we'll be transfuse 1 unit of packed red blood cells Overall still with good interaction and following simple commands, also noted to have increase in white blood cell count. Blood, respiratory cultures reordered and patient will be started back on ceftaz again, due to recent history of pseudomonal and Klebsiella pneumonia.     Regarding PEG tube-husband states that she does have a complex GI anatomy and the obstacle in the past was finding a surgeon suitable to place an NG tube,   HD as needed per nephrology US LE neg for DVT  Summary of MAJOR EVENTS/TEST RESULTS: Admission 02/07/14-05/07/14 Admission 07/21/14-09/06/14 Discharged to [facility]. Pt had palliative consult at that time, were asked to sign off by husband.  09/23 CT head: NAD 09/23 EEG: no epileptiform activity 09/23 PRBCs for  Hgb 6.4 09/24 bedside debridement of sacral wound. Abscess drained 09/25 Off vasopressors. More alert. No distress. Worsening thrombocytopenia. Vanc DC'd 09/29 Dr. [name] (I.D) excused from the case by patient's husband. 10/02 MRI -multiple infarcts 10/03 tracheal bleeding- transfused platelets 10/03 hospitalist service excused from the case by patient's husband 10/03 Echocardiogram ejection fraction was 55-60%, pulmonary systolic pressure was 39 mmHg 14/7810/08 restart TF's at lower rate, attempt reg HD  10/12 Transferred to med-surg floor. Remains on PCCM service 10/14 SLP eval: pt unable to tolerate PMV adequately 10/17-will re-attempt PM valve-discussed with Speech therapist 10/18 passed swallow eval-start pureed thick foods no thin liquids-continue NG feeds 10/19 transferred to step down for sepsis/aspiration pneumonia 10/19 cxr shows RLL opacity 10/21 sacral decub debride at bedside by surgery 10/22 started back on vasopressors while on HD 10/27 CT with osteo, R hip fx, unable to identify tip of dubhoff tube - sent for fluoro study 10/28 Ortho consultation: I do not feel that she is a surgical candidate. Therefore, I feel that it would best to manage this fracture nonsurgically and allow it to heal by itself over time, which it should.  10/28 Gen Surg consultation: Due to the lack of free air or free flow of contrast and the peritoneum there is no indication for any surgical intervention on this. Would recommend pulling back the feeding tube 1-2 cm. Would recheck an abdominal film to confirm no pneumoperitoneum in the morning. Absence of any changes okay to continue using Dobbhoff for feeding and medications. 10/28 gastrograffin study: The study confirms that the feeding tube pes perforated through the duodenum and the tip is within a cavity that fills with injected contrast. The  cavity does appear walled off 11/4 refuses oral feeds, continue TF's CT reviewed: T8-T9  discitis/osteomyelitis, RT HIP FRACTURE 11/5 placed back on vasopressors, placed back on Vent due to resp acidosis, levophed turned off 11/6 afternoon 11/5 PM Trach changed out due to cuff leak, #6 Shiley cuffed, 11/6 dubhoff occluded, removed and replaced, new dubhoff shows tube in the antral stomach 11/15 refusing to take oral feeds 11/18 placed back on Vent for increased WOB,SOB. 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 10/18/2015; the patient developed a new left basal pneumonia; with recurrent sepsis.  10/19/2015; fevers resolved with IV acetaminophen. Tube feeds changed from vital to Jevity.  12/7 CXR with stable b/l opacities.  12/12 elevated K s/p HD-remains on AC mode 12/16-vomiting-hold feeds and re-assess in next 24 hrs 12/17- TF restarted at 1/2 goal (goal = 50cc/per) 12/18- elevated wbc, fever (101.4), recultured, restarted on ceftaz 12/19 Pericardial friction rub. 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 Pneumoniae, P. Mirabilis, VRE Wound 9/26 >> No growth Blood 9/23 >> NEG CDiff 9/27>>neg Stool Cx 10/15>> negative Cdiff 10/25>>neg Trach Aspirate 10/28>> light growth pseudomonas Blood 10/28 >> 1/2 GPC >>  Sputum cultures obtained 09/22/15 due to mucus plugging>>Pseudomonas BONE TISSUE Cx 11/3>>E.coli (ESBL), k. Pneumonia (daptomycin and septra) Bld Cx 11/27>>negative thus far.  Trach Asp 11/27>> grew out Pseudomonas, and Klebsiella pneumonia. Trach Asp 12/18>> Bld Cx 12/18>>  ANTIMICROBIALS:  Aztreonam 9/23  >> 9/24 Vanc 9/23 >> 9/25 Vanc 9/26>>9/27 Daptomycin 9/27>> 10/12, 10/28>> off Meropenem 9/23 >> 10/14, 10/28 >> 10/31 Septra 11/8>> Zosyn 11/27,>> 11/30. Levaquin 11/29>> 11/29. Ceftaz 12/1>>12/11, 12/18>> Bactrim - chronic      SUBJ: WE events noted Recent fever noted She is presently awake and alert Flat affect No progress weaning  Filed Vitals:   11/07/15 1000 11/07/15 1200 11/07/15 1300 11/07/15 1400  BP: 105/53 93/45 90/47  101/57  Pulse: 88 80 80 81  Temp:  97.8 F (36.6 C)    TempSrc:  Axillary    Resp: 18 0 16 17  Height:      Weight:      SpO2: 98% 96% 98% 98%   Vent dependent Flat affect HEENT WNL, NGT present Trach site clean Chest clear anteriorly Regular rhythm, rate normal harsh pericardial friction rub Abdomen soft, + BS Ext cool, trace symmetric edema Severe sacral decubitus ulcer - site looks clean 12/19 Profoundly weak with muscle wasting  BMP Latest Ref Rng 11/04/2015 11/01/2015 10/31/2015  Glucose 65 - 99 mg/dL 161(W) 960(A) 540(J)  BUN 6 - 20 mg/dL 81(X) 91(Y) 78(G)  Creatinine 0.44 - 1.00 mg/dL 9.56(O) 1.30(Q) 6.57(Q)  Sodium 135 - 145 mmol/L 145 147(H) 144  Potassium 3.5 - 5.1 mmol/L 5.0 5.2(H) 6.0(H)  Chloride 101 - 111 mmol/L 103 102 101  CO2 22 - 32 mmol/L 33(H) 35(H) 34(H)  Calcium 8.9 - 10.3 mg/dL 4.6(N) 6.2(X) 5.2(W)    CBC Latest Ref Rng 11/06/2015 11/04/2015 11/01/2015  WBC 3.6 - 11.0 K/uL 15.7(H) 15.7(H) 11.9(H)  Hemoglobin 12.0 - 16.0 g/dL 6.3(L) 7.3(L) 8.0(L)  Hematocrit 35.0 - 47.0 % 21.2(L) 24.1(L)  26.4(L)  Platelets 150 - 440 K/uL 208 213 250    No new CXR  IMPRESSION: Very prolonged and complicated hospitalization after gastric bypass surgery 2014  Never returned home ESRD Recurrent severe sepsis Recurrent PNA H/O C diff H/O VTE DM2 - controlled Episodic hypoglycemia Chronic steroid therapy, for a history of of adrenal insufficiency Incidental finding of R hip fx - conservative mgmt. 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 Husband declined TEE Profound deconditioning Chronic pain New finding of pericardial friction rub 12/19  Cont vent support - settings reviewed and/or adjusted Cont vent bundle Daily SBT if/when meets criteria Cont HD per renal Monitor BMET intermittently Monitor I/Os Correct electrolytes as indicated DVT px: SQ heparin Monitor CBC intermittently Transfuse per usual ICU guidelines Cont TFs as tolerated Monitor temp, WBC count Micro and abx as above Echocardiogram 12/19   Billy Fischer, MD PCCM service Mobile [phone] Pager [phone]

## 2015-11-07 NOTE — Progress Notes (Signed)
Patient on vent. Initially on PSV however transitioned her back to rate and volume during the course of the night. Patient tolerated all well. Vitals remained stable. Suctioned several times during shift for thick yellow secretions from trach. Patient continues to have positional trach leak at times however tidal volumes remain adequate for this patient.

## 2015-11-07 NOTE — Progress Notes (Signed)
Nutrition Follow-up  DOCUMENTATION CODES:   Severe malnutrition in context of acute illness/injury  INTERVENTION:  EN: Jevity 1.5 to be increased to 36ml/hr per MD order today Will follow weekly and participate in daily ICU rounds.    NUTRITION DIAGNOSIS:   Inadequate oral intake related to wound healing, acute illness, chronic illness as evidenced by NPO status, estimated needs.    GOAL:   Patient will meet greater than or equal to 90% of their needs   MONITOR:    (Energy Intake, Anthropometrics, Digestive System, Electrolyte/Renal Profile, Glucose Profile)  REASON FOR ASSESSMENT:   Consult Enteral/tube feeding initiation and management  ASSESSMENT:      Pt tube feeding held secondary to vomiting, abdominal xray ordered on 12/16 following vomiting.  TF restarted on 12/17 at low rate and titrated to goal rate of 81ml/hr per MD order.  Tube feeding not increased last night secondary to pt compliant of nausea per RN, Rinaldo Cloud.    Tube feeding currently at 11ml/hr of Jevity 1.5 this am  Gastrointestinal Profile: Last BM: loose stool this am   Scheduled Medications:  . albumin human  12.5 g Intravenous Once  . antiseptic oral rinse  7 mL Mouth Rinse QID  . aspirin  81 mg Oral Daily  . cefTAZidime (FORTAZ)  IV  1 g Intravenous Q24H  . chlorhexidine gluconate  15 mL Mouth Rinse BID  . epoetin (EPOGEN/PROCRIT) injection  10,000 Units Intravenous Q M,W,F-HD  . escitalopram  10 mg Oral Daily  . feeding supplement (PRO-STAT SUGAR FREE 64)  30 mL Oral 6 X Daily  . folic acid  1 mg Oral Daily  . free water  20 mL Per Tube 6 times per day  . heparin subcutaneous  5,000 Units Subcutaneous Q12H  . hydrocortisone  10 mg Per Tube BID  . insulin aspart  0-9 Units Subcutaneous 6 times per day  . lidocaine  1 patch Transdermal Q24H  . loperamide  2 mg Oral BID  . midodrine  5 mg Per Tube TID WC  . mirtazapine  7.5 mg Oral QHS  . multivitamin  5 mL Oral Daily  . pantoprazole  sodium  40 mg Per Tube Daily  . saccharomyces boulardii  250 mg Oral BID  . sodium chloride  10-40 mL Intracatheter Q12H  . sodium hypochlorite   Irrigation BID  . sulfamethoxazole-trimethoprim  2.5 tablet Per Tube q1800  . thiamine  100 mg Per Tube Daily    Continuous Medications:  . dextrose 5 % and 0.9% NaCl Stopped (11/06/15 1156)  . feeding supplement (JEVITY 1.5 CAL/FIBER) 1,000 mL (11/05/15 1633)  . norepinephrine Stopped (10/08/15 0530)     Electrolyte/Renal Profile and Glucose Profile:   Recent Labs Lab 11/01/15 1109 11/04/15 0548  NA 147* 145  K 5.2* 5.0  CL 102 103  CO2 35* 33*  BUN 60* 76*  CREATININE 1.11* 1.31*  CALCIUM 8.8* 8.5*  MG 2.1 2.1  PHOS 4.3 4.5  GLUCOSE 126* 132*      Weight Trend since Admission: Filed Weights   11/05/15 0206 11/06/15 0357 11/07/15 0410  Weight: 177 lb 11.1 oz (80.6 kg) 177 lb 11.1 oz (80.6 kg) 177 lb 11.1 oz (80.6 kg)      Diet Order:   NPO  Skin:   (stage IV sacrum, stage III hip, unstageable foot; rectal ulcer from fleixseal, ulcer in nose from dobhoff)  Last BM:  +medium BM 10/19  Height:   Ht Readings from Last 1 Encounters:  01/25/12 5\' 9"  (1.753 m)    Weight:   Wt Readings from Last 1 Encounters:  11/07/15 177 lb 11.1 oz (80.6 kg)    Ideal Body Weight:     BMI:  Body mass index is 26.23 kg/(m^2).  Estimated Nutritional Needs:   Kcal:  TEE 1851 kcals/d (Ve 7.4, Tmax 37.4) Using current wt (97kg) edema noted (re-intubated)  Protein:  (1.5-2.0 g/kg)145-194 g/d but likely closer to 2.0-2.5 g/kg Using current wt of 97kg  Fluid:  1000ml + UOP  EDUCATION NEEDS:   No education needs identified at this time  LOW Care Level  Courtney [name] B. Freida BusmanAllen, RD, LDN [phone] (pager)

## 2015-11-07 NOTE — Progress Notes (Signed)
Subjective:   Hypotensive this morning. Tmax 101.1 yesterday morning Remains vent dependent.  No family at bedside.  Blood transfusion given yesterday   Objective:  Vital signs in last 24 hours:  Temp:  [97.7 F (36.5 C)-100.8 F (38.2 C)] 97.7 F (36.5 C) (12/19 0738) Pulse Rate:  [84-96] 84 (12/19 0700) Resp:  [16-34] 17 (12/19 0700) BP: (85-111)/(42-64) 92/53 mmHg (12/19 0700) SpO2:  [87 %-99 %] 97 % (12/19 0700) FiO2 (%):  [40 %] 40 % (12/19 0840) Weight:  [80.6 kg (177 lb 11.1 oz)] 80.6 kg (177 lb 11.1 oz) (12/19 0410)  Weight change: 0 kg (0 lb) Filed Weights   11/05/15 0206 11/06/15 0357 11/07/15 0410  Weight: 80.6 kg (177 lb 11.1 oz) 80.6 kg (177 lb 11.1 oz) 80.6 kg (177 lb 11.1 oz)    Intake/Output: I/O last 3 completed shifts: In: 1860 [I.V.:275; Blood:340; NG/GT:1195; IV Piggyback:50] Out: -    Intake/Output this shift:     Physical Exam: General: critically ill appearing  Head/ENT: OM moist, NG tube  Eyes: Anicteric, eyes open  Neck: Tracheostomy in place  Lungs:  Scattered rhonchi, vent assisted  PRVC fio2 40%  Heart: S1S2, + pericardial rub vs harsh murmur  Abdomen:  Soft, nontender, BS present  Extremities: 2+ dependent edema, anasarca  Neurologic: Lethargic  not following commands  Access:  R IJ permcath.       Basic Metabolic Panel:  Recent Labs Lab 11/01/15 1109 11/04/15 0548  NA 147* 145  K 5.2* 5.0  CL 102 103  CO2 35* 33*  GLUCOSE 126* 132*  BUN 60* 76*  CREATININE 1.11* 1.31*  CALCIUM 8.8* 8.5*  MG 2.1 2.1  PHOS 4.3 4.5    Liver Function Tests: No results for input(s): AST, ALT, ALKPHOS, BILITOT, PROT, ALBUMIN in the last 168 hours. No results for input(s): LIPASE, AMYLASE in the last 168 hours. No results for input(s): AMMONIA in the last 168 hours.  CBC:  Recent Labs Lab 11/01/15 1109 11/04/15 0548 11/06/15 0819  WBC 11.9* 15.7* 15.7*  HGB 8.0* 7.3* 6.3*  HCT 26.4* 24.1* 21.2*  MCV 97.4 98.2 98.2  PLT 250 213  208    Cardiac Enzymes: No results for input(s): CKTOTAL, CKMB, CKMBINDEX, TROPONINI in the last 168 hours.  BNP: Invalid input(s): POCBNP  CBG:  Recent Labs Lab 11/06/15 1711 11/06/15 1949 11/07/15 0108 11/07/15 0555 11/07/15 0749  GLUCAP 103* 103* 109* 104* 99    Microbiology: Results for orders placed or performed during the hospital encounter of 08/09/2015  Blood Culture (routine x 2)     Status: None   Collection Time: 07/24/2015  8:51 AM  Result Value Ref Range Status   Specimen Description BLOOD [name]  Final   Special Requests BOTTLES DRAWN AEROBIC AND ANAEROBIC  3CC  Final   Culture NO GROWTH 5 DAYS  Final   Report Status 08/17/2015 FINAL  Final  Blood Culture (routine x 2)     Status: None   Collection Time: 08/04/2015  9:20 AM  Result Value Ref Range Status   Specimen Description BLOOD LEFT ARM  Final   Special Requests BOTTLES DRAWN AEROBIC AND ANAEROBIC  1CC  Final   Culture NO GROWTH 5 DAYS  Final   Report Status 08/17/2015 FINAL  Final  Wound culture     Status: None   Collection Time: 08/11/2015  9:20 AM  Result Value Ref Range Status   Specimen Description DECUBITIS  Final   Special Requests Normal  Final  Gram Stain   Final    FEW WBC SEEN MANY GRAM NEGATIVE RODS RARE GRAM POSITIVE COCCI    Culture   Final    HEAVY GROWTH ESCHERICHIA COLI MODERATE GROWTH ENTEROBACTER AEROGENES PROTEUS MIRABILIS HEAVY GROWTH ENTEROCOCCUS SPECIES VRE HAVE INTRINSIC RESISTANCE TO MOST COMMONLY USED ANTIBIOTICS AND THE ABILITY TO ACQUIRE RESISTANCE TO MOST AVAILABLE ANTIBIOTICS.    Report Status 08/16/2015 FINAL  Final   Organism ID, Bacteria ESCHERICHIA COLI  Final   Organism ID, Bacteria ENTEROBACTER AEROGENES  Final   Organism ID, Bacteria PROTEUS MIRABILIS  Final   Organism ID, Bacteria ENTEROCOCCUS SPECIES  Final      Susceptibility   Enterobacter aerogenes - MIC*    CEFTAZIDIME <=1 SENSITIVE Sensitive     CEFAZOLIN >=64 RESISTANT Resistant     CEFTRIAXONE <=1  SENSITIVE Sensitive     CIPROFLOXACIN <=0.25 SENSITIVE Sensitive     GENTAMICIN <=1 SENSITIVE Sensitive     IMIPENEM 1 SENSITIVE Sensitive     TRIMETH/SULFA <=20 SENSITIVE Sensitive     * MODERATE GROWTH ENTEROBACTER AEROGENES   Escherichia coli - MIC*    AMPICILLIN <=2 SENSITIVE Sensitive     CEFTAZIDIME <=1 SENSITIVE Sensitive     CEFAZOLIN <=4 SENSITIVE Sensitive     CEFTRIAXONE <=1 SENSITIVE Sensitive     CIPROFLOXACIN <=0.25 SENSITIVE Sensitive     GENTAMICIN <=1 SENSITIVE Sensitive     IMIPENEM <=0.25 SENSITIVE Sensitive     TRIMETH/SULFA <=20 SENSITIVE Sensitive     * HEAVY GROWTH ESCHERICHIA COLI   Proteus mirabilis - MIC*    AMPICILLIN >=32 RESISTANT Resistant     CEFTAZIDIME <=1 SENSITIVE Sensitive     CEFAZOLIN 8 SENSITIVE Sensitive     CEFTRIAXONE <=1 SENSITIVE Sensitive     CIPROFLOXACIN <=0.25 SENSITIVE Sensitive     GENTAMICIN <=1 SENSITIVE Sensitive     IMIPENEM 1 SENSITIVE Sensitive     TRIMETH/SULFA <=20 SENSITIVE Sensitive     * PROTEUS MIRABILIS   Enterococcus species - MIC*    AMPICILLIN >=32 RESISTANT Resistant     VANCOMYCIN >=32 RESISTANT Resistant     GENTAMICIN SYNERGY SENSITIVE Sensitive     TETRACYCLINE Value in next row Resistant      RESISTANT>=16    * HEAVY GROWTH ENTEROCOCCUS SPECIES  MRSA PCR Screening     Status: None   Collection Time: 07/29/2015  2:38 PM  Result Value Ref Range Status   MRSA by PCR NEGATIVE NEGATIVE Final    Comment:        The GeneXpert MRSA Assay (FDA approved for NASAL specimens only), is one component of a comprehensive MRSA colonization surveillance program. It is not intended to diagnose MRSA infection nor to guide or monitor treatment for MRSA infections.   Blood culture (single)     Status: None   Collection Time: 07/30/2015  3:36 PM  Result Value Ref Range Status   Specimen Description BLOOD RIGHT ASSIST CONTROL  Final   Special Requests BOTTLES DRAWN AEROBIC AND ANAEROBIC  Final   Culture NO GROWTH 5  DAYS  Final   Report Status 08/17/2015 FINAL  Final  Wound culture     Status: None   Collection Time: 08/13/15 12:37 PM  Result Value Ref Range Status   Specimen Description WOUND  Final   Special Requests Normal  Final   Gram Stain   Final    FEW WBC SEEN TOO NUMEROUS TO COUNT GRAM NEGATIVE RODS FEW GRAM POSITIVE COCCI    Culture  Final    HEAVY GROWTH ESCHERICHIA COLI MODERATE GROWTH PROTEUS MIRABILIS LIGHT GROWTH KLEBSIELLA PNEUMONIAE MODERATE GROWTH ENTEROCOCCUS GALLINARUM CRITICAL RESULT CALLED TO, READ BACK BY AND VERIFIED WITH: [facility] BORBA AT 1042 08/16/15 DV    Report Status 08/17/2015 FINAL  Final   Organism ID, Bacteria ESCHERICHIA COLI  Final   Organism ID, Bacteria PROTEUS MIRABILIS  Final   Organism ID, Bacteria KLEBSIELLA PNEUMONIAE  Final   Organism ID, Bacteria ENTEROCOCCUS GALLINARUM  Final      Susceptibility   Escherichia coli - MIC*    AMPICILLIN >=32 RESISTANT Resistant     CEFTAZIDIME 4 RESISTANT Resistant     CEFAZOLIN >=64 RESISTANT Resistant     CEFTRIAXONE 16 RESISTANT Resistant     GENTAMICIN 2 SENSITIVE Sensitive     IMIPENEM >=16 RESISTANT Resistant     TRIMETH/SULFA <=20 SENSITIVE Sensitive     Extended ESBL POSITIVE Resistant     PIP/TAZO Value in next row Resistant      RESISTANT>=128    CIPROFLOXACIN Value in next row Sensitive      SENSITIVE<=0.25    * HEAVY GROWTH ESCHERICHIA COLI   Klebsiella pneumoniae - MIC*    AMPICILLIN Value in next row Resistant      SENSITIVE<=0.25    CEFTAZIDIME Value in next row Resistant      SENSITIVE<=0.25    CEFAZOLIN Value in next row Resistant      SENSITIVE<=0.25    CEFTRIAXONE Value in next row Resistant      SENSITIVE<=0.25    CIPROFLOXACIN Value in next row Resistant      SENSITIVE<=0.25    GENTAMICIN Value in next row Sensitive      SENSITIVE<=0.25    IMIPENEM Value in next row Resistant      SENSITIVE<=0.25    TRIMETH/SULFA Value in next row Resistant      SENSITIVE<=0.25    PIP/TAZO  Value in next row Resistant      RESISTANT>=128    * LIGHT GROWTH KLEBSIELLA PNEUMONIAE   Proteus mirabilis - MIC*    AMPICILLIN Value in next row Resistant      RESISTANT>=128    CEFTAZIDIME Value in next row Sensitive      RESISTANT>=128    CEFAZOLIN Value in next row Sensitive      RESISTANT>=128    CEFTRIAXONE Value in next row Sensitive      RESISTANT>=128    CIPROFLOXACIN Value in next row Sensitive      RESISTANT>=128    GENTAMICIN Value in next row Sensitive      RESISTANT>=128    IMIPENEM Value in next row Sensitive      RESISTANT>=128    TRIMETH/SULFA Value in next row Sensitive      RESISTANT>=128    PIP/TAZO Value in next row Sensitive      SENSITIVE<=4    * MODERATE GROWTH PROTEUS MIRABILIS   Enterococcus gallinarum - MIC*    AMPICILLIN Value in next row Resistant      SENSITIVE<=4    GENTAMICIN SYNERGY Value in next row Sensitive      SENSITIVE<=4    CIPROFLOXACIN Value in next row Resistant      RESISTANT>=8    TETRACYCLINE Value in next row Resistant      RESISTANT>=16    * MODERATE GROWTH ENTEROCOCCUS GALLINARUM  Wound culture     Status: None   Collection Time: 08/15/15  2:53 PM  Result Value Ref Range Status   Specimen Description WOUND  Final  Special Requests NONE  Final   Gram Stain FEW WBC SEEN NO ORGANISMS SEEN   Final   Culture NO GROWTH 3 DAYS  Final   Report Status 08/18/2015 FINAL  Final  C difficile quick scan w PCR reflex     Status: None   Collection Time: 08/17/15 11:34 AM  Result Value Ref Range Status   C Diff antigen NEGATIVE NEGATIVE Final   C Diff toxin NEGATIVE NEGATIVE Final   C Diff interpretation Negative for C. difficile  Final  Stool culture     Status: None   Collection Time: 09/03/15  3:51 PM  Result Value Ref Range Status   Specimen Description STOOL  Final   Special Requests Immunocompromised  Final   Culture   Final    NO SALMONELLA OR SHIGELLA ISOLATED No Pathogenic E. coli detected NO CAMPYLOBACTER DETECTED     Report Status 09/07/2015 FINAL  Final  C difficile quick scan w PCR reflex     Status: None   Collection Time: 09/13/15 12:51 AM  Result Value Ref Range Status   C Diff antigen NEGATIVE NEGATIVE Final   C Diff toxin NEGATIVE NEGATIVE Final   C Diff interpretation Negative for C. difficile  Final  Culture, blood (routine x 2)     Status: None   Collection Time: 09/16/15 11:24 AM  Result Value Ref Range Status   Specimen Description BLOOD LEFT HAND  Final   Special Requests BOTTLES DRAWN AEROBIC AND ANAEROBIC  1CC  Final   Culture NO GROWTH 5 DAYS  Final   Report Status 09/21/2015 FINAL  Final  Culture, blood (routine x 2)     Status: None   Collection Time: 09/16/15 12:23 PM  Result Value Ref Range Status   Specimen Description BLOOD RIGHT HAND  Final   Special Requests BOTTLES DRAWN AEROBIC AND ANAEROBIC  1CC  Final   Culture  Setup Time   Final    GRAM POSITIVE COCCI AEROBIC BOTTLE ONLY CRITICAL RESULT CALLED TO, READ BACK BY AND VERIFIED WITH: TESS THOMAS,RN 09/17/2015 0631 BY JRS.    Culture   Final    ENTEROCOCCUS FAECALIS AEROBIC BOTTLE ONLY VRE HAVE INTRINSIC RESISTANCE TO MOST COMMONLY USED ANTIBIOTICS AND THE ABILITY TO ACQUIRE RESISTANCE TO MOST AVAILABLE ANTIBIOTICS. CRITICAL RESULT CALLED TO, READ BACK BY AND VERIFIED WITH: CHERYL SMITH AT 7829 09/19/15 DV    Report Status 09/21/2015 FINAL  Final   Organism ID, Bacteria ENTEROCOCCUS FAECALIS  Final      Susceptibility   Enterococcus faecalis - MIC*    AMPICILLIN <=2 SENSITIVE Sensitive     LINEZOLID 2 SENSITIVE Sensitive     CIPROFLOXACIN Value in next row Resistant      RESISTANT>=8    TETRACYCLINE Value in next row Resistant      RESISTANT>=16    VANCOMYCIN Value in next row Resistant      RESISTANT>=32    GENTAMICIN SYNERGY Value in next row Resistant      RESISTANT>=32    * ENTEROCOCCUS FAECALIS  Culture, respiratory (NON-Expectorated)     Status: None   Collection Time: 09/16/15  3:50 PM  Result Value  Ref Range Status   Specimen Description TRACHEAL ASPIRATE  Final   Special Requests Immunocompromised  Final   Gram Stain   Final    FEW WBC SEEN GOOD SPECIMEN - 80-90% WBCS RARE GRAM NEGATIVE RODS    Culture LIGHT GROWTH PSEUDOMONAS AERUGINOSA  Final   Report Status 09/19/2015 FINAL  Final  Organism ID, Bacteria PSEUDOMONAS AERUGINOSA  Final      Susceptibility   Pseudomonas aeruginosa - MIC*    CEFTAZIDIME 8 SENSITIVE Sensitive     CIPROFLOXACIN 2 INTERMEDIATE Intermediate     GENTAMICIN >=16 RESISTANT Resistant     IMIPENEM >=16 RESISTANT Resistant     PIP/TAZO Value in next row Sensitive      SENSITIVE32    CEFEPIME Value in next row Sensitive      SENSITIVE8    LEVOFLOXACIN Value in next row Resistant      RESISTANT>=8    * LIGHT GROWTH PSEUDOMONAS AERUGINOSA  Culture, expectorated sputum-assessment     Status: None   Collection Time: 09/22/15  2:09 PM  Result Value Ref Range Status   Specimen Description ENDOTRACHEAL  Final   Special Requests Normal  Final   Sputum evaluation THIS SPECIMEN IS ACCEPTABLE FOR SPUTUM CULTURE  Final   Report Status 09/24/2015 FINAL  Final  Culture, respiratory (NON-Expectorated)     Status: None   Collection Time: 09/22/15  2:09 PM  Result Value Ref Range Status   Specimen Description ENDOTRACHEAL  Final   Special Requests Normal Reflexed from [id number]  Final   Gram Stain   Final    FEW WBC SEEN FEW GRAM NEGATIVE RODS POOR SPECIMEN - LESS THAN 70% WBCS    Culture   Final    MODERATE GROWTH PSEUDOMONAS AERUGINOSA LIGHT GROWTH KLEBSIELLA PNEUMONIAE REFER TO SENSITIVITIES FROM PREVIOUS CULTURE FOR ORG 2    Report Status 10/01/2015 FINAL  Final   Organism ID, Bacteria PSEUDOMONAS AERUGINOSA  Final   Organism ID, Bacteria KLEBSIELLA PNEUMONIAE  Final      Susceptibility   Pseudomonas aeruginosa - MIC*    CEFTAZIDIME 4 SENSITIVE Sensitive     CIPROFLOXACIN 2 INTERMEDIATE Intermediate     GENTAMICIN >=16 RESISTANT Resistant      IMIPENEM >=16 RESISTANT Resistant     PIP/TAZO Value in next row Sensitive      SENSITIVE16    * MODERATE GROWTH PSEUDOMONAS AERUGINOSA  Tissue culture     Status: None   Collection Time: 09/24/15  6:44 AM  Result Value Ref Range Status   Specimen Description BONE  Final   Special Requests Normal  Final   Gram Stain MODERATE WBC SEEN FEW GRAM NEGATIVE RODS   Final   Culture   Final    MODERATE GROWTH ESCHERICHIA COLI MODERATE GROWTH KLEBSIELLA PNEUMONIAE ESBL-EXTENDED SPECTRUM BETA LACTAMASE-THE ORGANISM IS RESISTANT TO PENICILLINS, CEPHALOSPORINS AND AZTREONAM ACCORDING TO CLSI M100-S15 VOL.25 N01 JAN 2005. ORGANISM 1 This organism isolate is resistant to one or more antiotic agents in three or more antimicrobial categories.  Suggest Infectious Disease consult.   ORGANISM 2 CRITICAL RESULT CALLED TO, READ BACK BY AND VERIFIED WITH: RN Mardene Celeste LINDSAY 09/27/15 1005AM    Report Status 09/28/2015 FINAL  Final   Organism ID, Bacteria ESCHERICHIA COLI  Final   Organism ID, Bacteria KLEBSIELLA PNEUMONIAE  Final      Susceptibility   Escherichia coli - MIC*    AMPICILLIN >=32 RESISTANT Resistant     CEFTAZIDIME 4 RESISTANT Resistant     CEFAZOLIN >=64 RESISTANT Resistant     CEFTRIAXONE 8 RESISTANT Resistant     GENTAMICIN <=1 SENSITIVE Sensitive     IMIPENEM 8 RESISTANT Resistant     TRIMETH/SULFA <=20 SENSITIVE Sensitive     Extended ESBL POSITIVE Resistant     PIP/TAZO Value in next row Resistant      RESISTANT>=128    *  MODERATE GROWTH ESCHERICHIA COLI   Klebsiella pneumoniae - MIC*    AMPICILLIN Value in next row Resistant      RESISTANT>=128    CEFTAZIDIME Value in next row Resistant      RESISTANT>=128    CEFAZOLIN Value in next row Resistant      RESISTANT>=128    CEFTRIAXONE Value in next row Resistant      RESISTANT>=128    CIPROFLOXACIN Value in next row Resistant      RESISTANT>=128    GENTAMICIN Value in next row Resistant      RESISTANT>=128    IMIPENEM Value in  next row Resistant      RESISTANT>=128    TRIMETH/SULFA Value in next row Sensitive      RESISTANT>=128    PIP/TAZO Value in next row Resistant      RESISTANT>=128    * MODERATE GROWTH KLEBSIELLA PNEUMONIAE  Anaerobic culture     Status: None   Collection Time: 09/24/15  3:55 PM  Result Value Ref Range Status   Specimen Description BONE  Final   Special Requests Normal  Final   Culture NO ANAEROBES ISOLATED  Final   Report Status 09/29/2015 FINAL  Final  Culture, fungus without smear     Status: None   Collection Time: 09/24/15  3:55 PM  Result Value Ref Range Status   Specimen Description BONE  Final   Special Requests Normal  Final   Culture NO FUNGUS ISOLATED AFTER 24 DAYS  Final   Report Status 10/18/2015 FINAL  Final  C difficile quick scan w PCR reflex     Status: None   Collection Time: 10/07/15  2:02 PM  Result Value Ref Range Status   C Diff antigen NEGATIVE NEGATIVE Final   C Diff toxin NEGATIVE NEGATIVE Final   C Diff interpretation Negative for C. difficile  Final  Culture, blood (routine x 2)     Status: None   Collection Time: 10/16/15  9:52 AM  Result Value Ref Range Status   Specimen Description BLOOD LEFT HAND  Final   Special Requests   Final    BOTTLES DRAWN AEROBIC AND ANAEROBIC  AER 6CC ANA 4CC   Culture NO GROWTH 6 DAYS  Final   Report Status 10/22/2015 FINAL  Final  Culture, blood (routine x 2)     Status: None   Collection Time: 10/16/15 12:25 PM  Result Value Ref Range Status   Specimen Description BLOOD LEFT HAND  Final   Special Requests BOTTLES DRAWN AEROBIC AND ANAEROBIC  5CC  Final   Culture NO GROWTH 6 DAYS  Final   Report Status 10/22/2015 FINAL  Final  Culture, expectorated sputum-assessment     Status: None   Collection Time: 10/18/15  1:15 PM  Result Value Ref Range Status   Specimen Description SPUTUM  Final   Special Requests NONE  Final   Sputum evaluation THIS SPECIMEN IS ACCEPTABLE FOR SPUTUM CULTURE  Final   Report Status  10/18/2015 FINAL  Final  Culture, respiratory (NON-Expectorated)     Status: None   Collection Time: 10/18/15  1:15 PM  Result Value Ref Range Status   Specimen Description SPUTUM  Final   Special Requests NONE Reflexed from [id number]  Final   Gram Stain   Final    GOOD SPECIMEN - 80-90% WBCS MODERATE WBC SEEN MANY GRAM NEGATIVE RODS    Culture   Final    HEAVY GROWTH PSEUDOMONAS AERUGINOSA MODERATE GROWTH KLEBSIELLA PNEUMONIAE CRITICAL RESULT CALLED  TO, READ BACK BY AND VERIFIED WITHRod Mae AT 1610 10/21/15 DV KLEBSIELLA PNEUMONIAE This organism isolate is resistant to one or more antiotic agents in three or more antimicrobial categories.  Suggest Infectious Disease  consult.      Report Status 10/22/2015 FINAL  Final   Organism ID, Bacteria PSEUDOMONAS AERUGINOSA  Final   Organism ID, Bacteria KLEBSIELLA PNEUMONIAE  Final      Susceptibility   Klebsiella pneumoniae - MIC*    AMPICILLIN >=32 RESISTANT Resistant     CEFAZOLIN >=64 RESISTANT Resistant     CEFTRIAXONE >=64 RESISTANT Resistant     CIPROFLOXACIN >=4 RESISTANT Resistant     GENTAMICIN >=16 RESISTANT Resistant     IMIPENEM >=16 RESISTANT Resistant     NITROFURANTOIN >=512 RESISTANT Resistant     TRIMETH/SULFA <=20 SENSITIVE Sensitive     * MODERATE GROWTH KLEBSIELLA PNEUMONIAE   Pseudomonas aeruginosa - MIC*    CEFTAZIDIME 4 SENSITIVE Sensitive     CIPROFLOXACIN >=4 RESISTANT Resistant     GENTAMICIN 8 INTERMEDIATE Intermediate     IMIPENEM >=16 RESISTANT Resistant     PIP/TAZO Value in next row Sensitive      SENSITIVE32    * HEAVY GROWTH PSEUDOMONAS AERUGINOSA  C difficile quick scan w PCR reflex     Status: None   Collection Time: 10/18/15  4:39 PM  Result Value Ref Range Status   C Diff antigen NEGATIVE NEGATIVE Final   C Diff toxin NEGATIVE NEGATIVE Final   C Diff interpretation Negative for C. difficile  Final  Culture, expectorated sputum-assessment     Status: None   Collection Time: 11/06/15   8:41 AM  Result Value Ref Range Status   Specimen Description EXPECTORATED SPUTUM  Final   Special Requests Immunocompromised  Final   Sputum evaluation THIS SPECIMEN IS ACCEPTABLE FOR SPUTUM CULTURE  Final   Report Status 11/06/2015 FINAL  Final    Coagulation Studies: No results for input(s): LABPROT, INR in the last 72 hours.  Urinalysis: No results for input(s): COLORURINE, LABSPEC, PHURINE, GLUCOSEU, HGBUR, BILIRUBINUR, KETONESUR, PROTEINUR, UROBILINOGEN, NITRITE, LEUKOCYTESUR in the last 72 hours.  Invalid input(s): APPERANCEUR    Imaging: Dg Chest 1 View  11/06/2015  CLINICAL DATA:  Patient unable to communicate. Follow-up chest x-ray. History of hypertension, coronary artery disease and IBD. EXAM: CHEST 1 VIEW COMPARISON:  Chest x-rays dated 10/26/2015, 10/19/2015 and 10/16/2015 FINDINGS: Tracheostomy remains well position. Right-sided dialysis catheter remains well positioned with tip in the expected region of the cavoatrial junction. Feeding tube passes below the diaphragm. Left IJ central line tip remains adequately positioned with tip in the upper SVC. Moderate cardiomegaly is unchanged. Overall cardiomediastinal silhouette is stable in size and configuration. The patchy bilateral airspace opacities, left greater than right, are unchanged in the short-term interval but increased compared to the earlier chest x-ray of 09/18/2015. Suspect small bilateral pleural effusions and bibasilar atelectasis. IMPRESSION: No new lung findings compared to most recent chest x-ray of 10/26/2015. Continued pulmonary edema pattern bilaterally, pneumonia less likely. Suspect superimposed atelectasis at each lung base and small bilateral pleural effusions. Tubes and lines are stable in position. Electronically Signed   By: [name] M.D.   On: 11/06/2015 10:30     Medications:   . dextrose 5 % and 0.9% NaCl Stopped (11/06/15 1156)  . feeding supplement (JEVITY 1.5 CAL/FIBER) 1,000 mL (11/05/15  1633)  . norepinephrine Stopped (10/08/15 0530)   . amantadine  200 mg Per Tube Q7 days  .  antiseptic oral rinse  7 mL Mouth Rinse QID  . aspirin  81 mg Oral Daily  . cefTAZidime (FORTAZ)  IV  1 g Intravenous Q24H  . chlorhexidine gluconate  15 mL Mouth Rinse BID  . epoetin (EPOGEN/PROCRIT) injection  10,000 Units Intravenous Q M,W,F-HD  . escitalopram  10 mg Oral Daily  . feeding supplement (PRO-STAT SUGAR FREE 64)  30 mL Oral 6 X Daily  . folic acid  1 mg Oral Daily  . free water  20 mL Per Tube 6 times per day  . heparin subcutaneous  5,000 Units Subcutaneous Q12H  . hydrocortisone  10 mg Per Tube BID  . insulin aspart  0-9 Units Subcutaneous 6 times per day  . lidocaine  1 patch Transdermal Q24H  . loperamide  2 mg Oral BID  . midodrine  5 mg Per Tube TID WC  . mirtazapine  7.5 mg Oral QHS  . multivitamin  5 mL Oral Daily  . pantoprazole sodium  40 mg Per Tube Daily  . saccharomyces boulardii  250 mg Oral BID  . sodium chloride  10-40 mL Intracatheter Q12H  . sodium hypochlorite   Irrigation BID  . sulfamethoxazole-trimethoprim  2.5 tablet Per Tube q1800  . thiamine  100 mg Per Tube Daily   acetaminophen (TYLENOL) oral liquid 160 mg/5 mL, HYDROmorphone (DILAUDID) injection, ipratropium-albuterol, morphine injection, ondansetron (ZOFRAN) IV, oxyCODONE, polyvinyl alcohol, sodium chloride, zinc oxide  Assessment/ Plan:  49 y.o. 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 Admitted on February 16, 2015  1. End-stage renal disease on hemodialysis on HD MWF. The patient has been on dialysis since October of 2014. R IJ permcath. Dialysis treatment with IV albumin for oncotic support. Midodrine and hydrocortisone ordered for blood pressure support.  - Continue MWF schedule.     - HD today  2. Anemia of CKD. Hemoglobin 6.3 - Continue scheduled Epogen with dialysis.  -  1 unit PRBC  Given 12/18  3. Acute resp failure:  -Patient remains on the ventilator at this point in time. FiO2 40%.  4. Sepsis: VRE in blood. Bone cultures E. Coli and klebsiella - again with fever, leukocytosis - Treatment as per ID and ICU team: daptomycin completed on 11/10 and now on high dose trimethoprim/sulfamethoxazole. - Repeat sputum, blood cultures results are pending   5. Secondary hyperparathyroidism: PTH 50, phosphorus and calcium at goal.  - no indication for vitamin D agents      LOS: 87 Kiwanna Spraker 12/19/20169:38 AM

## 2015-11-07 NOTE — Progress Notes (Signed)
MEDICATION RELATED CONSULT NOTE - Follow up   Pharmacy Consult for Renal dosing  Allergies  Allergen Reactions  . Contrast Media [Iodinated Diagnostic Agents] Anaphylaxis  . Ampicillin Rash    Patient Measurements: Ht 775ft 9in Height:  (SCALE BROKE) Weight: 177 lb 11.1 oz (80.6 kg) (sacle not working) IBW/kg (Calculated) : 66.2  Vital Signs: Temp: 97.8 F (36.6 C) (12/19 1200) Temp Source: Axillary (12/19 1200) BP: 93/45 mmHg (12/19 1200) Pulse Rate: 80 (12/19 1200)  Recent Labs  11/06/15 0819  WBC 15.7*  HGB 6.3*  HCT 21.2*  PLT 208   Estimated Creatinine Clearance: 58.4 mL/min (by C-G formula based on Cr of 1.31).   Assessment: 49 yo patient on HD. Pharmacy consulted for renal dosing of medications.   Plan:  No medications require adjustment at present. Pharmacy will continue to monitor patients medications and dose adjust as needed.    Luisa HartScott Solveig Fangman, PharmD  Clinical Pharmacist 11/07/2015

## 2015-11-07 NOTE — Progress Notes (Signed)
*  PRELIMINARY RESULTS* Echocardiogram 2D Echocardiogram has been performed.  Courtney [name] 11/07/2015, 3:10 PM

## 2015-11-08 DIAGNOSIS — I255 Ischemic cardiomyopathy: Secondary | ICD-10-CM

## 2015-11-08 LAB — GLUCOSE, CAPILLARY
GLUCOSE-CAPILLARY: 98 mg/dL (ref 65–99)
GLUCOSE-CAPILLARY: 101 mg/dL — AB (ref 65–99)
GLUCOSE-CAPILLARY: 131 mg/dL — AB (ref 65–99)
GLUCOSE-CAPILLARY: 142 mg/dL — AB (ref 65–99)
GLUCOSE-CAPILLARY: 122 mg/dL — AB (ref 65–99)
Glucose-Capillary: 83 mg/dL (ref 65–99)

## 2015-11-08 MED ORDER — ALBUMIN HUMAN 25 % IV SOLN
12.5000 g | Freq: Once | INTRAVENOUS | Status: AC
  Administered 2015-11-09: 12.5 g via INTRAVENOUS
  Filled 2015-11-08 (×2): qty 50

## 2015-11-08 NOTE — Progress Notes (Signed)
PT Cancellation Note  Patient Details Name: Courtney [name] MRN: [id number] DOB: [birth year]   Cancelled Treatment:    Reason Eval/Treat Not Completed: Patient declined, no reason specified (Patient easily arousable upon arrival to room.  Greets therapist with head nod and mouths "I'm good" when asked how she was doing today.  Repeatedly declined participation with ROM at this time, shaking head no to all attempts.  When asked if she would like therapist to stop coming by, she nodded head "no", and did shake head "yes" when asked if she would like therapist to try again another day.  Denied any needs at this time.  Appears more mentally clear and willing to attempt communication this date (mouthing words to therapist) Will make one additional attempt at PT; if patient continues to refuse/decline, will discontinue PT at this time due to lack of participation and lack of interest in therapy services.  Will continue to monitor. Of note, reviewed ECHO results from previous date suggestive of previous anterior wall MI.)   [name], PT, DPT, NCS 11/08/2015, 11:05 AM [phone]

## 2015-11-08 NOTE — Progress Notes (Signed)
MEDICATION RELATED CONSULT NOTE - Follow up   Pharmacy Consult for Renal dosing  Allergies  Allergen Reactions  . Contrast Media [Iodinated Diagnostic Agents] Anaphylaxis  . Ampicillin Rash    Patient Measurements: Ht 225ft 9in Height:  (SCALE BROKE) Weight: 177 lb 11.1 oz (80.6 kg) (bed scale not working) IBW/kg (Calculated) : 66.2  Vital Signs: Temp: 99.2 F (37.3 C) (12/20 1200) Temp Source: Axillary (12/20 1200) BP: 107/59 mmHg (12/20 1200) Pulse Rate: 94 (12/20 1300)  Recent Labs  11/06/15 0819 11/07/15 1750 11/07/15 1810  WBC 15.7* 14.7*  --   HGB 6.3* 7.9*  --   HCT 21.2* 26.0*  --   PLT 208 209  --   CREATININE  --   --  1.73*  PHOS  --   --  4.7*  ALBUMIN  --   --  1.9*   Estimated Creatinine Clearance: 44.2 mL/min (by C-G formula based on Cr of 1.73).   Assessment: 49 yo patient on HD. Pharmacy consulted for renal dosing of medications.   Plan:  No medications require adjustment at present. Pharmacy will continue to monitor patients medications and dose adjust as needed.    Luisa HartScott Irisha Grandmaison, PharmD  Clinical Pharmacist 11/08/2015

## 2015-11-08 NOTE — Progress Notes (Addendum)
Subjective:   Tolerated HD OK yesterday No complications 2 D echo is negative for vegetations Fever curve is improving Able to follow simple commands Reports some pain in the back   Objective:  Vital signs in last 24 hours:  Temp:  [97.7 F (36.5 C)-98.5 F (36.9 C)] 98.5 F (36.9 C) (12/20 0000) Pulse Rate:  [80-100] 95 (12/20 0700) Resp:  [0-28] 20 (12/20 0700) BP: (90-122)/(36-95) 101/47 mmHg (12/20 0700) SpO2:  [93 %-99 %] 94 % (12/20 0700) FiO2 (%):  [40 %] 40 % (12/20 0900) Weight:  [80.6 kg (177 lb 11.1 oz)] 80.6 kg (177 lb 11.1 oz) (12/20 0428)  Weight change: 0 kg (0 lb) Filed Weights   11/07/15 0410 11/07/15 1745 11/08/15 0428  Weight: 80.6 kg (177 lb 11.1 oz) 80.6 kg (177 lb 11.1 oz) 80.6 kg (177 lb 11.1 oz)    Intake/Output: I/O last 3 completed shifts: In: 1550 [NG/GT:1500; IV Piggyback:50] Out: 1500 [Other:1500]   Intake/Output this shift:  Total I/O In: 20 [I.V.:20] Out: -   Physical Exam: General: critically ill appearing  Head/ENT: OM moist, NG tube  Eyes: Anicteric, eyes open  Neck: Tracheostomy in place  Lungs:  Scattered rhonchi, vent assisted  PRVC fio2 40%  Heart: S1S2, + pericardial rub vs harsh murmur  Abdomen:  Soft, nontender, BS present  Extremities: 2+ dependent edema, anasarca  Neurologic: Lethargic  not following commands  Access:  R IJ permcath.       Basic Metabolic Panel:  Recent Labs Lab 11/01/15 1109 11/04/15 0548 11/07/15 1810  NA 147* 145 146*  K 5.2* 5.0 5.0  CL 102 103 104  CO2 35* 33* 34*  GLUCOSE 126* 132* 116*  BUN 60* 76* 80*  CREATININE 1.11* 1.31* 1.73*  CALCIUM 8.8* 8.5* 8.7*  MG 2.1 2.1  --   PHOS 4.3 4.5 4.7*    Liver Function Tests:  Recent Labs Lab 11/07/15 1810  ALBUMIN 1.9*   No results for input(s): LIPASE, AMYLASE in the last 168 hours. No results for input(s): AMMONIA in the last 168 hours.  CBC:  Recent Labs Lab 11/01/15 1109 11/04/15 0548 11/06/15 0819 11/07/15 1750   WBC 11.9* 15.7* 15.7* 14.7*  HGB 8.0* 7.3* 6.3* 7.9*  HCT 26.4* 24.1* 21.2* 26.0*  MCV 97.4 98.2 98.2 95.9  PLT 250 213 208 209    Cardiac Enzymes: No results for input(s): CKTOTAL, CKMB, CKMBINDEX, TROPONINI in the last 168 hours.  BNP: Invalid input(s): POCBNP  CBG:  Recent Labs Lab 11/07/15 1144 11/07/15 1615 11/07/15 2009 11/08/15 0017 11/08/15 0427  GLUCAP 113* 101* 100* 83 98    Microbiology: Results for orders placed or performed during the hospital encounter of September 10, 2015  Blood Culture (routine x 2)     Status: None   Collection Time: 09-10-2015  8:51 AM  Result Value Ref Range Status   Specimen Description BLOOD [name]  Final   Special Requests BOTTLES DRAWN AEROBIC AND ANAEROBIC  3CC  Final   Culture NO GROWTH 5 DAYS  Final   Report Status 08/17/2015 FINAL  Final  Blood Culture (routine x 2)     Status: None   Collection Time: 2015-09-10  9:20 AM  Result Value Ref Range Status   Specimen Description BLOOD LEFT ARM  Final   Special Requests BOTTLES DRAWN AEROBIC AND ANAEROBIC  1CC  Final   Culture NO GROWTH 5 DAYS  Final   Report Status 08/17/2015 FINAL  Final  Wound culture     Status:  None   Collection Time: 08/07/2015  9:20 AM  Result Value Ref Range Status   Specimen Description DECUBITIS  Final   Special Requests Normal  Final   Gram Stain   Final    FEW WBC SEEN MANY GRAM NEGATIVE RODS RARE GRAM POSITIVE COCCI    Culture   Final    HEAVY GROWTH ESCHERICHIA COLI MODERATE GROWTH ENTEROBACTER AEROGENES PROTEUS MIRABILIS HEAVY GROWTH ENTEROCOCCUS SPECIES VRE HAVE INTRINSIC RESISTANCE TO MOST COMMONLY USED ANTIBIOTICS AND THE ABILITY TO ACQUIRE RESISTANCE TO MOST AVAILABLE ANTIBIOTICS.    Report Status 08/16/2015 FINAL  Final   Organism ID, Bacteria ESCHERICHIA COLI  Final   Organism ID, Bacteria ENTEROBACTER AEROGENES  Final   Organism ID, Bacteria PROTEUS MIRABILIS  Final   Organism ID, Bacteria ENTEROCOCCUS SPECIES  Final      Susceptibility    Enterobacter aerogenes - MIC*    CEFTAZIDIME <=1 SENSITIVE Sensitive     CEFAZOLIN >=64 RESISTANT Resistant     CEFTRIAXONE <=1 SENSITIVE Sensitive     CIPROFLOXACIN <=0.25 SENSITIVE Sensitive     GENTAMICIN <=1 SENSITIVE Sensitive     IMIPENEM 1 SENSITIVE Sensitive     TRIMETH/SULFA <=20 SENSITIVE Sensitive     * MODERATE GROWTH ENTEROBACTER AEROGENES   Escherichia coli - MIC*    AMPICILLIN <=2 SENSITIVE Sensitive     CEFTAZIDIME <=1 SENSITIVE Sensitive     CEFAZOLIN <=4 SENSITIVE Sensitive     CEFTRIAXONE <=1 SENSITIVE Sensitive     CIPROFLOXACIN <=0.25 SENSITIVE Sensitive     GENTAMICIN <=1 SENSITIVE Sensitive     IMIPENEM <=0.25 SENSITIVE Sensitive     TRIMETH/SULFA <=20 SENSITIVE Sensitive     * HEAVY GROWTH ESCHERICHIA COLI   Proteus mirabilis - MIC*    AMPICILLIN >=32 RESISTANT Resistant     CEFTAZIDIME <=1 SENSITIVE Sensitive     CEFAZOLIN 8 SENSITIVE Sensitive     CEFTRIAXONE <=1 SENSITIVE Sensitive     CIPROFLOXACIN <=0.25 SENSITIVE Sensitive     GENTAMICIN <=1 SENSITIVE Sensitive     IMIPENEM 1 SENSITIVE Sensitive     TRIMETH/SULFA <=20 SENSITIVE Sensitive     * PROTEUS MIRABILIS   Enterococcus species - MIC*    AMPICILLIN >=32 RESISTANT Resistant     VANCOMYCIN >=32 RESISTANT Resistant     GENTAMICIN SYNERGY SENSITIVE Sensitive     TETRACYCLINE Value in next row Resistant      RESISTANT>=16    * HEAVY GROWTH ENTEROCOCCUS SPECIES  MRSA PCR Screening     Status: None   Collection Time: 07/28/2015  2:38 PM  Result Value Ref Range Status   MRSA by PCR NEGATIVE NEGATIVE Final    Comment:        The GeneXpert MRSA Assay (FDA approved for NASAL specimens only), is one component of a comprehensive MRSA colonization surveillance program. It is not intended to diagnose MRSA infection nor to guide or monitor treatment for MRSA infections.   Blood culture (single)     Status: None   Collection Time: 07/26/2015  3:36 PM  Result Value Ref Range Status   Specimen  Description BLOOD RIGHT ASSIST CONTROL  Final   Special Requests BOTTLES DRAWN AEROBIC AND ANAEROBIC  Final   Culture NO GROWTH 5 DAYS  Final   Report Status 08/17/2015 FINAL  Final  Wound culture     Status: None   Collection Time: 08/13/15 12:37 PM  Result Value Ref Range Status   Specimen Description WOUND  Final   Special Requests  Normal  Final   Gram Stain   Final    FEW WBC SEEN TOO NUMEROUS TO COUNT GRAM NEGATIVE RODS FEW GRAM POSITIVE COCCI    Culture   Final    HEAVY GROWTH ESCHERICHIA COLI MODERATE GROWTH PROTEUS MIRABILIS LIGHT GROWTH KLEBSIELLA PNEUMONIAE MODERATE GROWTH ENTEROCOCCUS GALLINARUM CRITICAL RESULT CALLED TO, READ BACK BY AND VERIFIED WITH: [facility] BORBA AT 1042 08/16/15 DV    Report Status 08/17/2015 FINAL  Final   Organism ID, Bacteria ESCHERICHIA COLI  Final   Organism ID, Bacteria PROTEUS MIRABILIS  Final   Organism ID, Bacteria KLEBSIELLA PNEUMONIAE  Final   Organism ID, Bacteria ENTEROCOCCUS GALLINARUM  Final      Susceptibility   Escherichia coli - MIC*    AMPICILLIN >=32 RESISTANT Resistant     CEFTAZIDIME 4 RESISTANT Resistant     CEFAZOLIN >=64 RESISTANT Resistant     CEFTRIAXONE 16 RESISTANT Resistant     GENTAMICIN 2 SENSITIVE Sensitive     IMIPENEM >=16 RESISTANT Resistant     TRIMETH/SULFA <=20 SENSITIVE Sensitive     Extended ESBL POSITIVE Resistant     PIP/TAZO Value in next row Resistant      RESISTANT>=128    CIPROFLOXACIN Value in next row Sensitive      SENSITIVE<=0.25    * HEAVY GROWTH ESCHERICHIA COLI   Klebsiella pneumoniae - MIC*    AMPICILLIN Value in next row Resistant      SENSITIVE<=0.25    CEFTAZIDIME Value in next row Resistant      SENSITIVE<=0.25    CEFAZOLIN Value in next row Resistant      SENSITIVE<=0.25    CEFTRIAXONE Value in next row Resistant      SENSITIVE<=0.25    CIPROFLOXACIN Value in next row Resistant      SENSITIVE<=0.25    GENTAMICIN Value in next row Sensitive      SENSITIVE<=0.25     IMIPENEM Value in next row Resistant      SENSITIVE<=0.25    TRIMETH/SULFA Value in next row Resistant      SENSITIVE<=0.25    PIP/TAZO Value in next row Resistant      RESISTANT>=128    * LIGHT GROWTH KLEBSIELLA PNEUMONIAE   Proteus mirabilis - MIC*    AMPICILLIN Value in next row Resistant      RESISTANT>=128    CEFTAZIDIME Value in next row Sensitive      RESISTANT>=128    CEFAZOLIN Value in next row Sensitive      RESISTANT>=128    CEFTRIAXONE Value in next row Sensitive      RESISTANT>=128    CIPROFLOXACIN Value in next row Sensitive      RESISTANT>=128    GENTAMICIN Value in next row Sensitive      RESISTANT>=128    IMIPENEM Value in next row Sensitive      RESISTANT>=128    TRIMETH/SULFA Value in next row Sensitive      RESISTANT>=128    PIP/TAZO Value in next row Sensitive      SENSITIVE<=4    * MODERATE GROWTH PROTEUS MIRABILIS   Enterococcus gallinarum - MIC*    AMPICILLIN Value in next row Resistant      SENSITIVE<=4    GENTAMICIN SYNERGY Value in next row Sensitive      SENSITIVE<=4    CIPROFLOXACIN Value in next row Resistant      RESISTANT>=8    TETRACYCLINE Value in next row Resistant      RESISTANT>=16    * MODERATE GROWTH  ENTEROCOCCUS GALLINARUM  Wound culture     Status: None   Collection Time: 08/15/15  2:53 PM  Result Value Ref Range Status   Specimen Description WOUND  Final   Special Requests NONE  Final   Gram Stain FEW WBC SEEN NO ORGANISMS SEEN   Final   Culture NO GROWTH 3 DAYS  Final   Report Status 08/18/2015 FINAL  Final  C difficile quick scan w PCR reflex     Status: None   Collection Time: 08/17/15 11:34 AM  Result Value Ref Range Status   C Diff antigen NEGATIVE NEGATIVE Final   C Diff toxin NEGATIVE NEGATIVE Final   C Diff interpretation Negative for C. difficile  Final  Stool culture     Status: None   Collection Time: 09/03/15  3:51 PM  Result Value Ref Range Status   Specimen Description STOOL  Final   Special Requests  Immunocompromised  Final   Culture   Final    NO SALMONELLA OR SHIGELLA ISOLATED No Pathogenic E. coli detected NO CAMPYLOBACTER DETECTED    Report Status 09/07/2015 FINAL  Final  C difficile quick scan w PCR reflex     Status: None   Collection Time: 09/13/15 12:51 AM  Result Value Ref Range Status   C Diff antigen NEGATIVE NEGATIVE Final   C Diff toxin NEGATIVE NEGATIVE Final   C Diff interpretation Negative for C. difficile  Final  Culture, blood (routine x 2)     Status: None   Collection Time: 09/16/15 11:24 AM  Result Value Ref Range Status   Specimen Description BLOOD LEFT HAND  Final   Special Requests BOTTLES DRAWN AEROBIC AND ANAEROBIC  1CC  Final   Culture NO GROWTH 5 DAYS  Final   Report Status 09/21/2015 FINAL  Final  Culture, blood (routine x 2)     Status: None   Collection Time: 09/16/15 12:23 PM  Result Value Ref Range Status   Specimen Description BLOOD RIGHT HAND  Final   Special Requests BOTTLES DRAWN AEROBIC AND ANAEROBIC  1CC  Final   Culture  Setup Time   Final    GRAM POSITIVE COCCI AEROBIC BOTTLE ONLY CRITICAL RESULT CALLED TO, READ BACK BY AND VERIFIED WITH: TESS THOMAS,RN 09/17/2015 0631 BY JRS.    Culture   Final    ENTEROCOCCUS FAECALIS AEROBIC BOTTLE ONLY VRE HAVE INTRINSIC RESISTANCE TO MOST COMMONLY USED ANTIBIOTICS AND THE ABILITY TO ACQUIRE RESISTANCE TO MOST AVAILABLE ANTIBIOTICS. CRITICAL RESULT CALLED TO, READ BACK BY AND VERIFIED WITH: CHERYL SMITH AT 1610 09/19/15 DV    Report Status 09/21/2015 FINAL  Final   Organism ID, Bacteria ENTEROCOCCUS FAECALIS  Final      Susceptibility   Enterococcus faecalis - MIC*    AMPICILLIN <=2 SENSITIVE Sensitive     LINEZOLID 2 SENSITIVE Sensitive     CIPROFLOXACIN Value in next row Resistant      RESISTANT>=8    TETRACYCLINE Value in next row Resistant      RESISTANT>=16    VANCOMYCIN Value in next row Resistant      RESISTANT>=32    GENTAMICIN SYNERGY Value in next row Resistant       RESISTANT>=32    * ENTEROCOCCUS FAECALIS  Culture, respiratory (NON-Expectorated)     Status: None   Collection Time: 09/16/15  3:50 PM  Result Value Ref Range Status   Specimen Description TRACHEAL ASPIRATE  Final   Special Requests Immunocompromised  Final   Gram Stain  Final    FEW WBC SEEN GOOD SPECIMEN - 80-90% WBCS RARE GRAM NEGATIVE RODS    Culture LIGHT GROWTH PSEUDOMONAS AERUGINOSA  Final   Report Status 09/19/2015 FINAL  Final   Organism ID, Bacteria PSEUDOMONAS AERUGINOSA  Final      Susceptibility   Pseudomonas aeruginosa - MIC*    CEFTAZIDIME 8 SENSITIVE Sensitive     CIPROFLOXACIN 2 INTERMEDIATE Intermediate     GENTAMICIN >=16 RESISTANT Resistant     IMIPENEM >=16 RESISTANT Resistant     PIP/TAZO Value in next row Sensitive      SENSITIVE32    CEFEPIME Value in next row Sensitive      SENSITIVE8    LEVOFLOXACIN Value in next row Resistant      RESISTANT>=8    * LIGHT GROWTH PSEUDOMONAS AERUGINOSA  Culture, expectorated sputum-assessment     Status: None   Collection Time: 09/22/15  2:09 PM  Result Value Ref Range Status   Specimen Description ENDOTRACHEAL  Final   Special Requests Normal  Final   Sputum evaluation THIS SPECIMEN IS ACCEPTABLE FOR SPUTUM CULTURE  Final   Report Status 09/24/2015 FINAL  Final  Culture, respiratory (NON-Expectorated)     Status: None   Collection Time: 09/22/15  2:09 PM  Result Value Ref Range Status   Specimen Description ENDOTRACHEAL  Final   Special Requests Normal Reflexed from [id number]  Final   Gram Stain   Final    FEW WBC SEEN FEW GRAM NEGATIVE RODS POOR SPECIMEN - LESS THAN 70% WBCS    Culture   Final    MODERATE GROWTH PSEUDOMONAS AERUGINOSA LIGHT GROWTH KLEBSIELLA PNEUMONIAE REFER TO SENSITIVITIES FROM PREVIOUS CULTURE FOR ORG 2    Report Status 10/01/2015 FINAL  Final   Organism ID, Bacteria PSEUDOMONAS AERUGINOSA  Final   Organism ID, Bacteria KLEBSIELLA PNEUMONIAE  Final      Susceptibility   Pseudomonas  aeruginosa - MIC*    CEFTAZIDIME 4 SENSITIVE Sensitive     CIPROFLOXACIN 2 INTERMEDIATE Intermediate     GENTAMICIN >=16 RESISTANT Resistant     IMIPENEM >=16 RESISTANT Resistant     PIP/TAZO Value in next row Sensitive      SENSITIVE16    * MODERATE GROWTH PSEUDOMONAS AERUGINOSA  Tissue culture     Status: None   Collection Time: 09/24/15  6:44 AM  Result Value Ref Range Status   Specimen Description BONE  Final   Special Requests Normal  Final   Gram Stain MODERATE WBC SEEN FEW GRAM NEGATIVE RODS   Final   Culture   Final    MODERATE GROWTH ESCHERICHIA COLI MODERATE GROWTH KLEBSIELLA PNEUMONIAE ESBL-EXTENDED SPECTRUM BETA LACTAMASE-THE ORGANISM IS RESISTANT TO PENICILLINS, CEPHALOSPORINS AND AZTREONAM ACCORDING TO CLSI M100-S15 VOL.25 N01 JAN 2005. ORGANISM 1 This organism isolate is resistant to one or more antiotic agents in three or more antimicrobial categories.  Suggest Infectious Disease consult.   ORGANISM 2 CRITICAL RESULT CALLED TO, READ BACK BY AND VERIFIED WITH: RN Mardene Celeste LINDSAY 09/27/15 1005AM    Report Status 09/28/2015 FINAL  Final   Organism ID, Bacteria ESCHERICHIA COLI  Final   Organism ID, Bacteria KLEBSIELLA PNEUMONIAE  Final      Susceptibility   Escherichia coli - MIC*    AMPICILLIN >=32 RESISTANT Resistant     CEFTAZIDIME 4 RESISTANT Resistant     CEFAZOLIN >=64 RESISTANT Resistant     CEFTRIAXONE 8 RESISTANT Resistant     GENTAMICIN <=1 SENSITIVE Sensitive     IMIPENEM  8 RESISTANT Resistant     TRIMETH/SULFA <=20 SENSITIVE Sensitive     Extended ESBL POSITIVE Resistant     PIP/TAZO Value in next row Resistant      RESISTANT>=128    * MODERATE GROWTH ESCHERICHIA COLI   Klebsiella pneumoniae - MIC*    AMPICILLIN Value in next row Resistant      RESISTANT>=128    CEFTAZIDIME Value in next row Resistant      RESISTANT>=128    CEFAZOLIN Value in next row Resistant      RESISTANT>=128    CEFTRIAXONE Value in next row Resistant      RESISTANT>=128     CIPROFLOXACIN Value in next row Resistant      RESISTANT>=128    GENTAMICIN Value in next row Resistant      RESISTANT>=128    IMIPENEM Value in next row Resistant      RESISTANT>=128    TRIMETH/SULFA Value in next row Sensitive      RESISTANT>=128    PIP/TAZO Value in next row Resistant      RESISTANT>=128    * MODERATE GROWTH KLEBSIELLA PNEUMONIAE  Anaerobic culture     Status: None   Collection Time: 09/24/15  3:55 PM  Result Value Ref Range Status   Specimen Description BONE  Final   Special Requests Normal  Final   Culture NO ANAEROBES ISOLATED  Final   Report Status 09/29/2015 FINAL  Final  Culture, fungus without smear     Status: None   Collection Time: 09/24/15  3:55 PM  Result Value Ref Range Status   Specimen Description BONE  Final   Special Requests Normal  Final   Culture NO FUNGUS ISOLATED AFTER 24 DAYS  Final   Report Status 10/18/2015 FINAL  Final  C difficile quick scan w PCR reflex     Status: None   Collection Time: 10/07/15  2:02 PM  Result Value Ref Range Status   C Diff antigen NEGATIVE NEGATIVE Final   C Diff toxin NEGATIVE NEGATIVE Final   C Diff interpretation Negative for C. difficile  Final  Culture, blood (routine x 2)     Status: None   Collection Time: 10/16/15  9:52 AM  Result Value Ref Range Status   Specimen Description BLOOD LEFT HAND  Final   Special Requests   Final    BOTTLES DRAWN AEROBIC AND ANAEROBIC  AER 6CC ANA 4CC   Culture NO GROWTH 6 DAYS  Final   Report Status 10/22/2015 FINAL  Final  Culture, blood (routine x 2)     Status: None   Collection Time: 10/16/15 12:25 PM  Result Value Ref Range Status   Specimen Description BLOOD LEFT HAND  Final   Special Requests BOTTLES DRAWN AEROBIC AND ANAEROBIC  5CC  Final   Culture NO GROWTH 6 DAYS  Final   Report Status 10/22/2015 FINAL  Final  Culture, expectorated sputum-assessment     Status: None   Collection Time: 10/18/15  1:15 PM  Result Value Ref Range Status   Specimen  Description SPUTUM  Final   Special Requests NONE  Final   Sputum evaluation THIS SPECIMEN IS ACCEPTABLE FOR SPUTUM CULTURE  Final   Report Status 10/18/2015 FINAL  Final  Culture, respiratory (NON-Expectorated)     Status: None   Collection Time: 10/18/15  1:15 PM  Result Value Ref Range Status   Specimen Description SPUTUM  Final   Special Requests NONE Reflexed from [id number]  Final   Gram Stain  Final    GOOD SPECIMEN - 80-90% WBCS MODERATE WBC SEEN MANY GRAM NEGATIVE RODS    Culture   Final    HEAVY GROWTH PSEUDOMONAS AERUGINOSA MODERATE GROWTH KLEBSIELLA PNEUMONIAE CRITICAL RESULT CALLED TO, READ BACK BY AND VERIFIED WITHRod Mae: JOANNA LINDSAY AT [phone] 10/21/15 DV KLEBSIELLA PNEUMONIAE This organism isolate is resistant to one or more antiotic agents in three or more antimicrobial categories.  Suggest Infectious Disease  consult.      Report Status 10/22/2015 FINAL  Final   Organism ID, Bacteria PSEUDOMONAS AERUGINOSA  Final   Organism ID, Bacteria KLEBSIELLA PNEUMONIAE  Final      Susceptibility   Klebsiella pneumoniae - MIC*    AMPICILLIN >=32 RESISTANT Resistant     CEFAZOLIN >=64 RESISTANT Resistant     CEFTRIAXONE >=64 RESISTANT Resistant     CIPROFLOXACIN >=4 RESISTANT Resistant     GENTAMICIN >=16 RESISTANT Resistant     IMIPENEM >=16 RESISTANT Resistant     NITROFURANTOIN >=512 RESISTANT Resistant     TRIMETH/SULFA <=20 SENSITIVE Sensitive     * MODERATE GROWTH KLEBSIELLA PNEUMONIAE   Pseudomonas aeruginosa - MIC*    CEFTAZIDIME 4 SENSITIVE Sensitive     CIPROFLOXACIN >=4 RESISTANT Resistant     GENTAMICIN 8 INTERMEDIATE Intermediate     IMIPENEM >=16 RESISTANT Resistant     PIP/TAZO Value in next row Sensitive      SENSITIVE32    * HEAVY GROWTH PSEUDOMONAS AERUGINOSA  C difficile quick scan w PCR reflex     Status: None   Collection Time: 10/18/15  4:39 PM  Result Value Ref Range Status   C Diff antigen NEGATIVE NEGATIVE Final   C Diff toxin NEGATIVE NEGATIVE Final    C Diff interpretation Negative for C. difficile  Final  Culture, blood (Routine X 2) w Reflex to ID Panel     Status: None (Preliminary result)   Collection Time: 11/06/15  8:27 AM  Result Value Ref Range Status   Specimen Description BLOOD RIGHT HAND  Final   Special Requests BOTTLES DRAWN AEROBIC AND ANAEROBIC 3CC  Final   Culture NO GROWTH 1 DAY  Final   Report Status PENDING  Incomplete  Culture, blood (Routine X 2) w Reflex to ID Panel     Status: None (Preliminary result)   Collection Time: 11/06/15  8:29 AM  Result Value Ref Range Status   Specimen Description BLOOD LEFT HAND  Final   Special Requests BOTTLES DRAWN AEROBIC AND ANAEROBIC  1CC  Final   Culture NO GROWTH 1 DAY  Final   Report Status PENDING  Incomplete  Culture, expectorated sputum-assessment     Status: None   Collection Time: 11/06/15  8:41 AM  Result Value Ref Range Status   Specimen Description EXPECTORATED SPUTUM  Final   Special Requests Immunocompromised  Final   Sputum evaluation THIS SPECIMEN IS ACCEPTABLE FOR SPUTUM CULTURE  Final   Report Status 11/06/2015 FINAL  Final  Culture, respiratory (NON-Expectorated)     Status: None (Preliminary result)   Collection Time: 11/06/15  8:41 AM  Result Value Ref Range Status   Specimen Description EXPECTORATED SPUTUM  Final   Special Requests Immunocompromised Reflexed from [id number]  Final   Gram Stain PENDING  Incomplete   Culture   Final    MODERATE GROWTH GRAM NEGATIVE RODS IDENTIFICATION AND SUSCEPTIBILITIES TO FOLLOW ONCE ISOLATED ISOLATING MULTIPLE ORGANISMS    Report Status PENDING  Incomplete    Coagulation Studies: No results for input(s): LABPROT, INR  in the last 72 hours.  Urinalysis: No results for input(s): COLORURINE, LABSPEC, PHURINE, GLUCOSEU, HGBUR, BILIRUBINUR, KETONESUR, PROTEINUR, UROBILINOGEN, NITRITE, LEUKOCYTESUR in the last 72 hours.  Invalid input(s): APPERANCEUR    Imaging: No results found.   Medications:   . dextrose  5 % and 0.9% NaCl Stopped (11/06/15 1156)  . feeding supplement (JEVITY 1.5 CAL/FIBER) 1,000 mL (11/07/15 2208)  . norepinephrine Stopped (10/08/15 0530)   . antiseptic oral rinse  7 mL Mouth Rinse QID  . aspirin  81 mg Oral Daily  . cefTAZidime (FORTAZ)  IV  1 g Intravenous Q24H  . chlorhexidine gluconate  15 mL Mouth Rinse BID  . epoetin (EPOGEN/PROCRIT) injection  10,000 Units Intravenous Q M,W,F-HD  . escitalopram  10 mg Oral Daily  . feeding supplement (PRO-STAT SUGAR FREE 64)  30 mL Oral 6 X Daily  . folic acid  1 mg Oral Daily  . free water  20 mL Per Tube 6 times per day  . heparin subcutaneous  5,000 Units Subcutaneous Q12H  . hydrocortisone  10 mg Per Tube BID  . insulin aspart  0-9 Units Subcutaneous 6 times per day  . lidocaine  1 patch Transdermal Q24H  . loperamide  2 mg Oral BID  . midodrine  5 mg Per Tube TID WC  . mirtazapine  7.5 mg Oral QHS  . multivitamin  5 mL Oral Daily  . pantoprazole sodium  40 mg Per Tube Daily  . saccharomyces boulardii  250 mg Oral BID  . sodium chloride  10-40 mL Intracatheter Q12H  . sodium hypochlorite   Irrigation BID  . sulfamethoxazole-trimethoprim  2.5 tablet Per Tube q1800  . thiamine  100 mg Per Tube Daily   acetaminophen (TYLENOL) oral liquid 160 mg/5 mL, HYDROmorphone (DILAUDID) injection, ipratropium-albuterol, morphine injection, ondansetron (ZOFRAN) IV, oxyCODONE, polyvinyl alcohol, sodium chloride, zinc oxide  Assessment/ Plan:  49 y.o. 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 Admitted on Aug 31, 2015  Echo: 11/07/19: moderately to severely reduced. The estimated ejection fraction was in the range of 30% to 35%. Regional wall motion abnormalities: Severe hypokinesis of the anterior and anteroseptal myocardium.  1. End-stage renal disease on hemodialysis on HD MWF. The patient has been on dialysis since October of 2014. R IJ permcath. Dialysis treatment with IV albumin for oncotic support. Midodrine and hydrocortisone ordered for blood pressure support.  - Continue MWF schedule.     - HD on WEDNESDAY  2. Anemia of CKD. Hemoglobin 6.3 - Continue scheduled Epogen with dialysis.  -  1 unit PRBC  Given 12/18  3. Acute resp failure:  -Patient remains on the ventilator at this point in time.   4. Sepsis: VRE in blood. Bone cultures E. Coli and klebsiella  - Treatment as per ID and ICU team:     5. Secondary hyperparathyroidism: PTH 50, phosphorus and calcium at goal.  - no indication for vitamin D agents      LOS: 88 Malijah Lietz 12/20/201610:30 AM

## 2015-11-08 NOTE — Progress Notes (Signed)
Stephanie AcreVishal Mungal, MD Physician Signed Critical Care Progress Notes 11/06/2015 8:47 AM    Expand All Collapse All    [facility] [place] Critical Care Medicine Progess Note  Name:Courtney [name] ZOX:[id number] DOB:09/14/1966   ADMISSION DATE: 07/15/2015   INITIAL PRESENTATION:  49 AAF who has been in medical facilities (hosp, LTAC, rehab) for 2 yrs following gastric bypass surgery with multiple complications. Now with chronic trach, ESRD, profound debilitation, severe sacral pressure ulcer. Was seemingly making progress and transferred to rehab facility approx one week prior to this admission. [facility] with AMS and hypotension. Working dx of severe sepsis/septic shock due to infected sacral pressure ulcer. Since admission. Her course is been very complicated with numerous complications including septic shock and GI bleeding. Now with failure to wean from vent    INTERVAL HISTORY: No new developments. No distress on full vent support this AM. Awakens to voice.   Summary of MAJOR EVENTS/TEST RESULTS: Admission 02/07/14-05/07/14 Admission 07/21/14-09/06/14 Discharged to [facility]. Pt had palliative consult at that time, were asked to sign off by husband.  09/23 CT head: NAD 09/23 EEG: no epileptiform activity 09/23 PRBCs for Hgb 6.4 09/24 bedside debridement of sacral wound. Abscess drained 09/25 Off vasopressors. More alert. No distress. Worsening thrombocytopenia. Vanc DC'd 09/29 Dr. [name] (I.D) excused from the case by patient's husband. 10/02 MRI -multiple infarcts 10/03 tracheal bleeding- transfused platelets 10/03 hospitalist service excused from the case by patient's husband 10/03 Echocardiogram ejection fraction was 55-60%, pulmonary systolic pressure was 39 mmHg 81/1910/08 restart TF's at lower rate, attempt reg HD  10/12 Transferred to med-surg floor. Remains on PCCM service 10/14 SLP eval: pt unable to tolerate PMV adequately 10/17-will re-attempt  PM valve-discussed with Speech therapist 10/18 passed swallow eval-start pureed thick foods no thin liquids-continue NG feeds 10/19 transferred to step down for sepsis/aspiration pneumonia 10/19 cxr shows RLL opacity 10/21 sacral decub debride at bedside by surgery 10/22 started back on vasopressors while on HD 10/27 CT with osteo, R hip fx, unable to identify tip of dubhoff tube - sent for fluoro study 10/28 Ortho consultation: I do not feel that she is a surgical candidate. Therefore, I feel that it would best to manage this fracture nonsurgically and allow it to heal by itself over time, which it should.  10/28 Gen Surg consultation: Due to the lack of free air or free flow of contrast and the peritoneum there is no indication for any surgical intervention on this. Would recommend pulling back the feeding tube 1-2 cm. Would recheck an abdominal film to confirm no pneumoperitoneum in the morning. Absence of any changes okay to continue using Dobbhoff for feeding and medications. 10/28 gastrograffin study: The study confirms that the feeding tube pes perforated through the duodenum and the tip is within a cavity that fills with injected contrast. The cavity does appear walled off 11/4 refuses oral feeds, continue TF's CT reviewed: T8-T9 discitis/osteomyelitis, RT HIP FRACTURE 11/5 placed back on vasopressors, placed back on Vent due to resp acidosis, levophed turned off 11/6 afternoon 11/5 PM Trach changed out due to cuff leak, #6 Shiley cuffed, 11/6 dubhoff occluded, removed and replaced, new dubhoff shows tube in the antral stomach 11/15 refusing to take oral feeds 11/18 placed back on Vent for increased WOB,SOB. 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 10/18/2015; the patient developed a new left basal pneumonia; with recurrent sepsis.  10/19/2015; fevers  resolved with IV acetaminophen. Tube feeds changed from  vital to Jevity.  12/7 CXR with stable b/l opacities.  12/12 elevated K s/p HD-remains on AC mode 12/16-vomiting-hold feeds and re-assess in next 24 hrs 12/17- TF restarted at 1/2 goal (goal = 50cc/per) 12/18- elevated wbc, fever (101.4), recultured, restarted on ceftaz 12/19 Pericardial friction rub. Echocardiogram:  LVEF 30-35%, anteroseptal hypokinesis or akinesis c/w anterior wall MI 12/20 Dr Sung Amabile discussed new echocardiogram findings with pt's husband and explained that this likely represents an anterior wall MI sometime in the previous couple of weeks. It was explained that she is not a candidate for any cardiology intervention. Code status was again discussed. Husband expressed understanding that she was not going to survive this illness in any favorable way but continues to request that she be full code status based on what he believes to be her wishes  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 Pneumoniae, P. Mirabilis, VRE Wound 9/26 >> No growth Blood 9/23 >> NEG CDiff 9/27>>neg Stool Cx 10/15>> negative Cdiff 10/25>>neg Trach Aspirate 10/28>> light growth pseudomonas Blood 10/28 >> 1/2 GPC >>  Sputum cultures obtained 09/22/15 due to mucus plugging>>Pseudomonas BONE TISSUE Cx 11/3>>E.coli (ESBL), k. Pneumonia (daptomycin and septra) Bld Cx 11/27>>negative thus far.  Trach Asp 11/27>> grew out Pseudomonas, and Klebsiella pneumonia. Trach Asp 12/18>> Bld Cx 12/18>>  ANTIMICROBIALS:  Aztreonam 9/23 >> 9/24 Vanc 9/23 >> 9/25 Vanc 9/26>>9/27 Daptomycin 9/27>> 10/12, 10/28>> off Meropenem 9/23 >> 10/14, 10/28 >>  10/31 Septra 11/8>> Zosyn 11/27,>> 11/30. Levaquin 11/29>> 11/29. Ceftaz 12/1>>12/11, 12/18>> Bactrim - chronic      Filed Vitals:   11/08/15 1000 11/08/15 1100 11/08/15 1200 11/08/15 1300  BP: 88/37 98/51 107/59   Pulse: 91 93 98 94  Temp:   99.2 F (37.3 C)   TempSrc:   Axillary   Resp: Height:      Weight:      SpO2: 96% 92% 93% 94%   Vent dependent Flat affect HEENT WNL, NGT present Trach site clean Chest clear anteriorly Regular rhythm, rate normal, pericardial friction rub less prominent Abdomen soft, + BS Ext cool, trace symmetric edema Severe sacral decubitus ulcer - site looks clean 12/19 Profoundly weak with muscle wasting  BMP Latest Ref Rng 11/07/2015 11/04/2015 11/01/2015  Glucose 65 - 99 mg/dL 960(A) 540(J) 811(B)  BUN 6 - 20 mg/dL 14(N) 82(N) 56(O)  Creatinine 0.44 - 1.00 mg/dL 1.30(Q) 6.57(Q) 4.69(G)  Sodium 135 - 145 mmol/L 146(H) 145 147(H)  Potassium 3.5 - 5.1 mmol/L 5.0 5.0 5.2(H)  Chloride 101 - 111 mmol/L 104 103 102  CO2 22 - 32 mmol/L 34(H) 33(H) 35(H)  Calcium 8.9 - 10.3 mg/dL 2.9(B) 2.8(U) 1.3(K)    CBC Latest Ref Rng 11/07/2015 11/06/2015 11/04/2015  WBC 3.6 - 11.0 K/uL 14.7(H) 15.7(H) 15.7(H)  Hemoglobin 12.0 - 16.0 g/dL 7.9(L) 6.3(L) 7.3(L)  Hematocrit 35.0 - 47.0 % 26.0(L) 21.2(L) 24.1(L)  Platelets 150 - 440 K/uL 209 208 213    No new CXR  IMPRESSION: Very prolonged and complicated hospitalization after gastric bypass surgery 2014  Never returned home ESRD Recurrent severe sepsis Recurrent PNA H/O C diff H/O  VTE DM2 - controlled Episodic hypoglycemia Chronic steroid therapy, for a history of of adrenal insufficiency Incidental finding of R hip fx - conservative mgmt. 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 Husband declined TEE Profound deconditioning Chronic pain New finding of pericardial friction rub 12/19 New finding of cardiomyopathy, likely post MI 12/19  Cont vent support - settings reviewed and/or adjusted Cont vent bundle Daily SBT if/when meets criteria Cont HD per renal Monitor BMET intermittently Monitor I/Os Correct electrolytes as indicated DVT px: SQ heparin Monitor CBC intermittently Transfuse per usual ICU guidelines Cont TFs as tolerated Monitor temp, WBC count Micro and abx as above   Billy Fischer, MD PCCM service Mobile [phone] Pager [phone]

## 2015-11-09 ENCOUNTER — Inpatient Hospital Stay: Payer: 59

## 2015-11-09 LAB — GLUCOSE, CAPILLARY
GLUCOSE-CAPILLARY: 106 mg/dL — AB (ref 65–99)
GLUCOSE-CAPILLARY: 85 mg/dL (ref 65–99)
Glucose-Capillary: 70 mg/dL (ref 65–99)
Glucose-Capillary: 74 mg/dL (ref 65–99)
Glucose-Capillary: 86 mg/dL (ref 65–99)
Glucose-Capillary: 87 mg/dL (ref 65–99)
Glucose-Capillary: 96 mg/dL (ref 65–99)

## 2015-11-09 LAB — COMPREHENSIVE METABOLIC PANEL
ALBUMIN: 2 g/dL — AB (ref 3.5–5.0)
ALT: 43 U/L (ref 14–54)
ANION GAP: 10 (ref 5–15)
AST: 68 U/L — AB (ref 15–41)
Alkaline Phosphatase: 1677 U/L — ABNORMAL HIGH (ref 38–126)
BILIRUBIN TOTAL: 1.2 mg/dL (ref 0.3–1.2)
BUN: 39 mg/dL — ABNORMAL HIGH (ref 6–20)
CHLORIDE: 100 mmol/L — AB (ref 101–111)
CO2: 33 mmol/L — ABNORMAL HIGH (ref 22–32)
Calcium: 8.4 mg/dL — ABNORMAL LOW (ref 8.9–10.3)
Creatinine, Ser: 0.85 mg/dL (ref 0.44–1.00)
GFR calc Af Amer: >60 mL/min (ref >60)
GLUCOSE: 92 mg/dL (ref 65–99)
POTASSIUM: 3.4 mmol/L — AB (ref 3.5–5.1)
Sodium: 143 mmol/L (ref 135–145)
TOTAL PROTEIN: 6.1 g/dL — AB (ref 6.5–8.1)

## 2015-11-09 LAB — CBC
HCT: 25.9 % — ABNORMAL LOW (ref 35.0–47.0)
HEMOGLOBIN: 7.6 g/dL — AB (ref 12.0–16.0)
MCH: 28.6 pg (ref 26.0–34.0)
MCHC: 29.5 g/dL — ABNORMAL LOW (ref 32.0–36.0)
MCV: 97 fL (ref 80.0–100.0)
PLATELETS: 216 10*3/uL (ref 150–440)
RBC: 2.68 MIL/uL — AB (ref 3.80–5.20)
RDW: 18.4 % — ABNORMAL HIGH (ref 11.5–14.5)
WBC: 17.7 10*3/uL — AB (ref 3.6–11.0)

## 2015-11-09 LAB — RENAL FUNCTION PANEL
ALBUMIN: 1.8 g/dL — AB (ref 3.5–5.0)
ANION GAP: 6 (ref 5–15)
BUN: 64 mg/dL — ABNORMAL HIGH (ref 6–20)
CALCIUM: 8.5 mg/dL — AB (ref 8.9–10.3)
CO2: 33 mmol/L — ABNORMAL HIGH (ref 22–32)
Chloride: 105 mmol/L (ref 101–111)
Creatinine, Ser: 1.41 mg/dL — ABNORMAL HIGH (ref 0.44–1.00)
GFR calc Af Amer: 49 mL/min — ABNORMAL LOW (ref >60)
GFR, EST NON AFRICAN AMERICAN: 43 mL/min — AB (ref >60)
GLUCOSE: 98 mg/dL (ref 65–99)
POTASSIUM: 4.3 mmol/L (ref 3.5–5.1)
Phosphorus: 3.3 mg/dL (ref 2.5–4.6)
SODIUM: 144 mmol/L (ref 135–145)

## 2015-11-09 LAB — TROPONIN I: TROPONIN I: 0.11 ng/mL — AB (ref <0.031)

## 2015-11-09 LAB — CK: CK TOTAL: 5 U/L — AB (ref 38–234)

## 2015-11-09 LAB — MAGNESIUM: MAGNESIUM: 1.8 mg/dL (ref 1.7–2.4)

## 2015-11-09 LAB — PHOSPHORUS: Phosphorus: 2.4 mg/dL — ABNORMAL LOW (ref 2.5–4.6)

## 2015-11-09 MED ORDER — DILTIAZEM HCL 25 MG/5ML IV SOLN
INTRAVENOUS | Status: AC
  Administered 2015-11-09: 10 mg via INTRAVENOUS
  Filled 2015-11-09: qty 5

## 2015-11-09 MED ORDER — DILTIAZEM HCL 25 MG/5ML IV SOLN
10.0000 mg | Freq: Once | INTRAVENOUS | Status: AC
  Administered 2015-11-09: 10 mg via INTRAVENOUS

## 2015-11-09 MED ORDER — AMIODARONE HCL IN DEXTROSE 360-4.14 MG/200ML-% IV SOLN
60.0000 mg/h | INTRAVENOUS | Status: DC
  Administered 2015-11-09 (×2): 60 mg/h via INTRAVENOUS
  Filled 2015-11-09 (×2): qty 200

## 2015-11-09 MED ORDER — AMIODARONE HCL IN DEXTROSE 360-4.14 MG/200ML-% IV SOLN
30.0000 mg/h | INTRAVENOUS | Status: DC
  Administered 2015-11-10: 30 mg/h via INTRAVENOUS
  Filled 2015-11-09 (×6): qty 200

## 2015-11-09 MED ORDER — SODIUM CHLORIDE 0.9 % IV BOLUS (SEPSIS)
250.0000 mL | Freq: Once | INTRAVENOUS | Status: AC
Start: 2015-11-09 — End: 2015-11-09
  Administered 2015-11-09: 250 mL via INTRAVENOUS

## 2015-11-09 MED ORDER — AMIODARONE LOAD VIA INFUSION
150.0000 mg | Freq: Once | INTRAVENOUS | Status: AC
  Administered 2015-11-09: 150 mg via INTRAVENOUS
  Filled 2015-11-09: qty 83.34

## 2015-11-09 NOTE — Progress Notes (Signed)
HD Tx Initiated 

## 2015-11-09 NOTE — Progress Notes (Addendum)
Update Patient developed rapid tachycardia into the 180s during dialysis. EKG shows A. fib/flutter, patient was given 10 mg of IV Cardizem with little response. Her mentation and level of alertness has not changed she's to responding to questions and moving her left arm.  Plan: -Stat CBC, CMP, magnesium, phosphorus, cardiac enzymes -Chest x-ray -May to start amiodarone drip if electrolyte are normal  Stephanie AcreVishal Daphna Lafuente, MD [place] [facility] Pager [phone]- [phone] (please enter 7-digits) On Call Pager - [phone] (please enter 7-digits)   Addendum Discussed case cardiology (Dr. Kirke CorinArida), recommended starting Amiodarone gtt. -orders placed.  Stephanie AcreVishal Zuha Dejonge, MD [place] [facility] Pager [phone]- [phone] (please enter 7-digits) On Call Pager - [phone] (please enter 7-digits)

## 2015-11-09 NOTE — Progress Notes (Signed)
ID e note Called by micro re her Kleb on sputum cx which is now pan resistant  I have asked lab to send to [facility] for testing to ceftazidime -avibactam, tigecycline and colisitin   If becomes unstable would start colisitin and tigecycline pending results

## 2015-11-09 NOTE — Progress Notes (Signed)
Pt vomited. Pt given Zofran 4mg . Tube feeds stopped. Dr. Dema SeverinMungal notified. Shaylen Nephew E 8:55 AM 11/09/2015

## 2015-11-09 NOTE — Progress Notes (Signed)
Post HD Tx Documentation

## 2015-11-09 NOTE — Progress Notes (Signed)
Post HD Tx Assessment  

## 2015-11-09 NOTE — Progress Notes (Signed)
MEDICATION RELATED CONSULT NOTE - Follow up   Pharmacy Consult for Renal dosing  Allergies  Allergen Reactions  . Contrast Media [Iodinated Diagnostic Agents] Anaphylaxis  . Ampicillin Rash    Patient Measurements: Ht 485ft 9in Height:  (SCALE BROKE) Weight: 177 lb 11.1 oz (80.6 kg) (bed scale not working) IBW/kg (Calculated) : 66.2  Vital Signs: Temp: 99.4 F (37.4 C) (12/21 0800) Temp Source: Axillary (12/21 0800) BP: 103/54 mmHg (12/21 1000) Pulse Rate: 87 (12/21 1000)  Recent Labs  11/07/15 1750 11/07/15 1810  WBC 14.7*  --   HGB 7.9*  --   HCT 26.0*  --   PLT 209  --   CREATININE  --  1.73*  PHOS  --  4.7*  ALBUMIN  --  1.9*   Estimated Creatinine Clearance: 44.2 mL/min (by C-G formula based on Cr of 1.73).   Assessment: 49 yo patient on HD. Pharmacy consulted for renal dosing of medications.   Plan:  No medications require adjustment at present. Pharmacy will continue to monitor and dose adjust as needed.    Luisa HartScott Diego Ulbricht, PharmD  Clinical Pharmacist 11/09/2015

## 2015-11-09 NOTE — Progress Notes (Signed)
Pre HD Tx Machine & Patient Checks 

## 2015-11-09 NOTE — Progress Notes (Signed)
HD Tx Early Termination

## 2015-11-09 NOTE — Progress Notes (Signed)
HD Pre Assessment  

## 2015-11-09 NOTE — Progress Notes (Addendum)
Stephanie Acre, MD Physician Signed Critical Care Progress Notes 11/06/2015 8:47 AM    Expand All Collapse All    [facility] Critical Care Medicine Progess Note  Name:Courtney [name] ZOX:[id number] DOB:October 29, 1966   ADMISSION DATE: 07/31/2015   INITIAL PRESENTATION:  49 AAF who has been in medical facilities (hosp, LTAC, rehab) for 2 yrs following gastric bypass surgery with multiple complications. Now with chronic trach, ESRD, profound debilitation, severe sacral pressure ulcer. Was seemingly making progress and transferred to rehab facility approx one week prior to this admission. She was sent to [facility] ED with AMS and hypotension. Working dx of severe sepsis/septic shock due to infected sacral pressure ulcer. Since admission. Her course is been very complicated with numerous complications including septic shock and GI bleeding. Now with failure to wean from vent and possible MI event, repeat ECHO EF 30-35, possible old MI, severe hypokinesis of the nnterior and anteroseptal myocardium     INTERVAL HISTORY: No new developments. No distress on full vent support this AM. Awakens to voice.   Summary of MAJOR EVENTS/TEST RESULTS: Admission 02/07/14-05/07/14 Admission 07/21/14-09/06/14 Discharged to [facility]. Pt had palliative consult at that time, were asked to sign off by husband.  09/23 CT head: NAD 09/23 EEG: no epileptiform activity 09/23 PRBCs for Hgb 6.4 09/24 bedside debridement of sacral wound. Abscess drained 09/25 Off vasopressors. More alert. No distress. Worsening thrombocytopenia. Vanc DC'd 09/29 Dr. Sampson Goon (I.D) excused from the case by patient's husband. 10/02 MRI -multiple infarcts 10/03 tracheal bleeding- transfused platelets 10/03 hospitalist service excused from the case by patient's husband 10/03 Echocardiogram ejection fraction was 55-60%, pulmonary systolic pressure was 39 mmHg 81/19 restart TF's at lower rate, attempt reg HD  10/12  Transferred to med-surg floor. Remains on PCCM service 10/14 SLP eval: pt unable to tolerate PMV adequately 10/17-will re-attempt PM valve-discussed with Speech therapist 10/18 passed swallow eval-start pureed thick foods no thin liquids-continue NG feeds 10/19 transferred to step down for sepsis/aspiration pneumonia 10/19 cxr shows RLL opacity 10/21 sacral decub debride at bedside by surgery 10/22 started back on vasopressors while on HD 10/27 CT with osteo, R hip fx, unable to identify tip of dubhoff tube - sent for fluoro study 10/28 Ortho consultation: I do not feel that she is a surgical candidate. Therefore, I feel that it would best to manage this fracture nonsurgically and allow it to heal by itself over time, which it should.  10/28 Gen Surg consultation: Due to the lack of free air or free flow of contrast and the peritoneum there is no indication for any surgical intervention on this. Would recommend pulling back the feeding tube 1-2 cm. Would recheck an abdominal film to confirm no pneumoperitoneum in the morning. Absence of any changes okay to continue using Dobbhoff for feeding and medications. 10/28 gastrograffin study: The study confirms that the feeding tube pes perforated through the duodenum and the tip is within a cavity that fills with injected contrast. The cavity does appear walled off 11/4 refuses oral feeds, continue TF's CT reviewed: T8-T9 discitis/osteomyelitis, RT HIP FRACTURE 11/5 placed back on vasopressors, placed back on Vent due to resp acidosis, levophed turned off 11/6 afternoon 11/5 PM Trach changed out due to cuff leak, #6 Shiley cuffed, 11/6 dubhoff occluded, removed and replaced, new dubhoff shows tube in the antral stomach 11/15 refusing to take oral feeds 11/18 placed back on Vent for increased WOB,SOB. 11/28 Wound care as re -consulted, there appears to be a new pocket/fistula around the area of  her stage IV sacral wound. 10/18/2015; the patient developed  a new left basal pneumonia; with recurrent sepsis.  10/19/2015; fevers resolved with IV acetaminophen. Tube feeds changed from vital to Jevity.  12/7 CXR with stable b/l opacities.  12/12 elevated K s/p HD-remains on AC mode 12/16-vomiting-hold feeds and re-assess in next 24 hrs 12/17- TF restarted at 1/2 goal (goal = 50cc/per) 12/18- elevated wbc, fever (101.4), recultured, restarted on ceftaz 12/19 Pericardial friction rub. Echocardiogram:  LVEF 30-35%, anteroseptal hypokinesis or akinesis c/w anterior wall MI 12/20 Dr Sung AmabileSimonds discussed new echocardiogram findings with pt's husband and explained that this likely represents an anterior wall MI sometime in the previous couple of weeks. It was explained that she is not a candidate for any cardiology intervention. Code status was again discussed. Husband expressed understanding that she was not going to survive this illness in any favorable way but continues to request that she be full code status based on what he believes to be her wishes 12/21 vomitied 50cc of TF - AM TF held, restart TF at 3pm @30cc   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 Pneumoniae, P. Mirabilis, VRE Wound 9/26 >> No growth Blood 9/23 >> NEG CDiff 9/27>>neg Stool Cx 10/15>> negative Cdiff 10/25>>neg Trach Aspirate 10/28>> light growth pseudomonas Blood 10/28 >> 1/2 GPC >>  Sputum cultures obtained 09/22/15 due to mucus plugging>>Pseudomonas BONE TISSUE Cx 11/3>>E.coli (ESBL), k. Pneumonia (daptomycin and septra) Bld Cx 11/27>>negative thus far.  Trach Asp 11/27>> grew out Pseudomonas, and Klebsiella  pneumonia. Trach Asp 12/18>> Bld Cx 12/18>>  ANTIMICROBIALS:  Aztreonam 9/23 >> 9/24 Vanc 9/23 >> 9/25 Vanc 9/26>>9/27 Daptomycin 9/27>> 10/12, 10/28>> off Meropenem 9/23 >> 10/14, 10/28 >> 10/31 Septra 11/8>> Zosyn 11/27,>> 11/30. Levaquin 11/29>> 11/29. Ceftaz 12/1>>12/11, 12/18>> Bactrim - chronic      Filed Vitals:   11/09/15 0800 11/09/15 0900 11/09/15 1000 11/09/15 1330  BP: 102/61 93/55 103/54 105/44  Pulse: 91 90 87 87  Temp: 99.4 F (37.4 C)     TempSrc: Axillary     Resp: 20 23 16 16   Height:      Weight:      SpO2: 96% 95% 96% 97%   Vent dependent Flat affect HEENT WNL, NGT present Trach site clean Chest clear anteriorly Regular rhythm, rate normal, could not appreciate friction rub today Abdomen soft, + BS Ext cool, trace symmetric edema Severe sacral decubitus ulcer - site looks clean 12/19 Profoundly weak with muscle wasting  BMP Latest Ref Rng 11/07/2015 11/04/2015 11/01/2015  Glucose 65 - 99 mg/dL 161(W116(H) 960(A132(H) 540(J126(H)  BUN 6 - 20 mg/dL 81(X80(H) 91(Y76(H) 78(G60(H)  Creatinine 0.44 - 1.00 mg/dL 9.56(O1.73(H) 1.30(Q1.31(H) 6.57(Q1.11(H)  Sodium 135 - 145 mmol/L 146(H) 145 147(H)  Potassium 3.5 - 5.1 mmol/L 5.0 5.0 5.2(H)  Chloride 101 - 111 mmol/L 104 103 102  CO2 22 - 32 mmol/L 34(H) 33(H) 35(H)  Calcium 8.9 - 10.3 mg/dL 4.6(N8.7(L) 6.2(X8.5(L) 5.2(W8.8(L)    CBC Latest Ref Rng 11/07/2015 11/06/2015 11/04/2015  WBC 3.6 - 11.0 K/uL 14.7(H) 15.7(H) 15.7(H)  Hemoglobin 12.0 - 16.0 g/dL 7.9(L) 6.3(L) 7.3(L)  Hematocrit 35.0 - 47.0 % 26.0(L) 21.2(L) 24.1(L)  Platelets 150 - 440 K/uL  209 208 213    No new CXR  IMPRESSION: Very prolonged and complicated hospitalization after gastric bypass surgery 2014  Never returned home ESRD Recurrent severe sepsis Recurrent PNA H/O C diff H/O VTE DM2 - controlled Episodic hypoglycemia Chronic steroid therapy, for a history of of adrenal insufficiency Incidental finding of R hip fx - conservative mgmt. 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 Husband declined TEE Profound deconditioning Chronic pain New finding of pericardial friction rub 12/19 New finding of cardiomyopathy, likely post MI 12/19  Cont vent support - settings reviewed and/or adjusted Cont vent bundle Daily SBT if/when meets criteria Cont HD per renal Monitor BMET intermittently Monitor I/Os Correct electrolytes as indicated DVT px: SQ heparin Monitor CBC intermittently Transfuse per usual ICU guidelines Cont TFs as tolerated Monitor temp, WBC count Micro and abx as above  Husband updated via phone  Critical Care time - 35 mins  Stephanie Acre, MD [place] [facility] Pager [phone] (please enter 7-digits) On Call Pager - [phone] (please enter 7-digits)

## 2015-11-10 DIAGNOSIS — I481 Persistent atrial fibrillation: Secondary | ICD-10-CM

## 2015-11-10 DIAGNOSIS — R000 Tachycardia, unspecified: Secondary | ICD-10-CM

## 2015-11-10 LAB — BASIC METABOLIC PANEL
ANION GAP: 11 (ref 5–15)
BUN: 47 mg/dL — ABNORMAL HIGH (ref 6–20)
CALCIUM: 8.1 mg/dL — AB (ref 8.9–10.3)
CO2: 31 mmol/L (ref 22–32)
Chloride: 96 mmol/L — ABNORMAL LOW (ref 101–111)
Creatinine, Ser: 1.23 mg/dL — ABNORMAL HIGH (ref 0.44–1.00)
GFR, EST AFRICAN AMERICAN: 58 mL/min — AB (ref >60)
GFR, EST NON AFRICAN AMERICAN: 50 mL/min — AB (ref >60)
Glucose, Bld: 279 mg/dL — ABNORMAL HIGH (ref 65–99)
Potassium: 3.6 mmol/L (ref 3.5–5.1)
SODIUM: 138 mmol/L (ref 135–145)

## 2015-11-10 LAB — CBC
HEMATOCRIT: 26.5 % — AB (ref 35.0–47.0)
HEMOGLOBIN: 7.9 g/dL — AB (ref 12.0–16.0)
MCH: 28.4 pg (ref 26.0–34.0)
MCHC: 29.8 g/dL — ABNORMAL LOW (ref 32.0–36.0)
MCV: 95.4 fL (ref 80.0–100.0)
Platelets: 230 10*3/uL (ref 150–440)
RBC: 2.78 MIL/uL — ABNORMAL LOW (ref 3.80–5.20)
RDW: 18.3 % — ABNORMAL HIGH (ref 11.5–14.5)
WBC: 23.1 10*3/uL — AB (ref 3.6–11.0)

## 2015-11-10 LAB — TROPONIN I
TROPONIN I: 0.28 ng/mL — AB (ref <0.031)
Troponin I: 0.08 ng/mL — ABNORMAL HIGH (ref <0.031)

## 2015-11-10 LAB — CK: CK TOTAL: 5 U/L — AB (ref 38–234)

## 2015-11-10 LAB — GLUCOSE, CAPILLARY
GLUCOSE-CAPILLARY: 95 mg/dL (ref 65–99)
GLUCOSE-CAPILLARY: 80 mg/dL (ref 65–99)
GLUCOSE-CAPILLARY: 102 mg/dL — AB (ref 65–99)
GLUCOSE-CAPILLARY: 100 mg/dL — AB (ref 65–99)

## 2015-11-10 LAB — HEPATITIS B SURFACE ANTIGEN: Hepatitis B Surface Ag: NEGATIVE

## 2015-11-10 MED ORDER — JEVITY 1.5 CAL/FIBER PO LIQD
1000.0000 mL | ORAL | Status: DC
  Administered 2015-11-10 – 2015-11-18 (×6): 1000 mL

## 2015-11-10 MED ORDER — AMIODARONE HCL 200 MG PO TABS
400.0000 mg | ORAL_TABLET | Freq: Two times a day (BID) | ORAL | Status: DC
  Administered 2015-11-10 – 2015-11-11 (×3): 400 mg via ORAL
  Filled 2015-11-10 (×3): qty 2

## 2015-11-10 NOTE — Progress Notes (Signed)
Patient: Courtney [name] / Admit Date: 06-11-15 / Date of Encounter: 11/10/2015, 1:24 PM   Subjective: Previously consulted on patient on 10/19 for possible TEE. 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 She has been in facilities for over a year. She presented with stage IV sacral decubiti. It was felt she was not hemodynamically stable for TEE (and her husband ultimately declined this). TTE showed EF of 55-60%, normal wall motion, GR1DD, mild MR, PASP 39 mm Hg. Since then, her course has been very complicated with numerous complications including septic shock and GIB. She currently has failure to wean from the vent.   She developed new onset Afib with RVR with HR into the 150s to 180s on 12/21 with troponin of 0.11-->0.08-->0.28. Repeat echo on 12/19 showed EF of 30-35%, severe HK of the anterior and anteroseptal myocardium.  Elevated CVP. This was concerning for an old anterior MI.  She now has pan-resistant Klebsiella in her sputum and is requiring Levophed. She converted to sinus rhythm with heart rates in the 70's to 80's on amiodarone gtt.   Review of Systems: Review of Systems  Unable to perform ROS: intubated     Objective: Telemetry: Episodes of Afib with RVR into the 150s to 180s on 12/21, currently in NSR in the 70s Physical Exam: Blood pressure 114/53, pulse 87, temperature 100.6 F (38.1 C), temperature source Axillary, resp. rate 32, height 5\' 9"  (1.753 m), weight 177 lb 11.1 oz (80.6 kg), SpO2 98 %. Body mass index is 26.23  kg/(m^2). General: Critically ill, in no acute distress. Head: Normocephalic, atraumatic, sclera non-icteric, no xanthomas, nares are without discharge. Neck: Negative for carotid bruits. JVP not elevated. Lungs: Decreased breath sounds bilaterally. Breathing is unlabored. Heart: RRR S1 S2 without murmurs, rubs, or gallops.  Abdomen: Soft, non-tender, non-distended with normoactive bowel sounds. No rebound/guarding. Extremities: No clubbing or cyanosis. No edema. Distal pedal pulses are 2+ and equal bilaterally. Neuro: Intubated. Psych:  Intubated.   Intake/Output Summary (Last 24 hours) at 11/10/15 1324 Last data filed at 11/10/15 1200  Gross per 24 hour  Intake 1486.37 ml  Output   -120 ml  Net 1606.37 ml    Inpatient Medications:  . antiseptic oral rinse  7 mL Mouth Rinse QID  . aspirin  81 mg Oral Daily  . cefTAZidime (FORTAZ)  IV  1 g Intravenous Q24H  . chlorhexidine gluconate  15 mL Mouth Rinse BID  . epoetin (EPOGEN/PROCRIT) injection  10,000 Units Intravenous Q M,W,F-HD  . escitalopram  10 mg Oral Daily  . feeding supplement (PRO-STAT SUGAR FREE 64)  30 mL Oral 6 X Daily  . folic acid  1 mg Oral Daily  . free water  20 mL Per Tube 6 times per day  . heparin subcutaneous  5,000 Units Subcutaneous Q12H  . hydrocortisone  10 mg Per Tube BID  . insulin aspart  0-9 Units Subcutaneous 6 times per day  . lidocaine  1 patch Transdermal Q24H  . loperamide  2 mg Oral BID  . midodrine  5 mg Per Tube TID WC  . mirtazapine  7.5 mg Oral QHS  . multivitamin  5 mL  Oral Daily  . pantoprazole sodium  40 mg Per Tube Daily  . saccharomyces boulardii  250 mg Oral BID  . sodium chloride  10-40 mL Intracatheter Q12H  . sodium hypochlorite   Irrigation BID  . thiamine  100 mg Per Tube Daily   Infusions:  . amiodarone 30 mg/hr (11/10/15 0700)  . dextrose 5 % and 0.9% NaCl 25 mL/hr at 11/10/15 0700  . feeding supplement (JEVITY 1.5 CAL/FIBER) 1,000 mL (11/10/15 0914)  . norepinephrine  3 mcg/min (11/10/15 1242)    Labs:  Recent Labs  11/09/15 1307 11/09/15 1534 11/10/15 0933  NA 144 143 138  K 4.3 3.4* 3.6  CL 105 100* 96*  CO2 33* 33* 31  GLUCOSE 98 92 279*  BUN 64* 39* 47*  CREATININE 1.41* 0.85 1.23*  CALCIUM 8.5* 8.4* 8.1*  MG  --  1.8  --   PHOS 3.3 2.4*  --     Recent Labs  11/09/15 1307 11/09/15 1534  AST  --  68*  ALT  --  43  ALKPHOS  --  1677*  BILITOT  --  1.2  PROT  --  6.1*  ALBUMIN 1.8* 2.0*    Recent Labs  11/09/15 1307 11/10/15 0930  WBC 17.7* 23.1*  HGB 7.6* 7.9*  HCT 25.9* 26.5*  MCV 97.0 95.4  PLT 216 230    Recent Labs  11/09/15 1534 11/10/15 0125 11/10/15 0930  CKTOTAL 5* 5*  --   TROPONINI 0.11* 0.08* 0.28*   Invalid input(s): POCBNP No results for input(s): HGBA1C in the last 72 hours.   Weights: Filed Weights   11/08/15 0428 11/09/15 0454  Weight: 177 lb 11.1 oz (80.6 kg) 177 lb 11.1 oz (80.6 kg)     Radiology/Studies:  Dg Chest 1 View  11/06/2015  CLINICAL DATA:  Patient unable to communicate. Follow-up chest x-ray. History of hypertension, coronary artery disease and IBD. EXAM: CHEST 1 VIEW COMPARISON:  Chest x-rays dated 10/26/2015, 10/19/2015 and 10/16/2015 FINDINGS: Tracheostomy remains well position. Right-sided dialysis catheter remains well positioned with tip in the expected region of the cavoatrial junction. Feeding tube passes below the diaphragm. Left IJ central line tip remains adequately positioned with tip in the upper SVC. Moderate cardiomegaly is unchanged. Overall cardiomediastinal silhouette is stable in size and configuration. The patchy bilateral airspace opacities, left greater than right, are unchanged in the short-term interval but increased compared to the earlier chest x-ray of 09/18/2015. Suspect small bilateral pleural effusions and bibasilar atelectasis. IMPRESSION: No new lung findings compared to most recent chest x-ray of 10/26/2015. Continued pulmonary edema pattern  bilaterally, pneumonia less likely. Suspect superimposed atelectasis at each lung base and small bilateral pleural effusions. Tubes and lines are stable in position. Electronically Signed   By: [name] M.D.   On: 11/06/2015 10:30   Dg Chest 1 View  10/19/2015  CLINICAL DATA:  Shortness of Breath EXAM: CHEST 1 VIEW COMPARISON:  October 18, 2015 FINDINGS: Tracheostomy catheter tip is 6.4 cm above the carina. Right-sided central catheter tip is just beyond the cavoatrial junction in the right atrium. The left-sided central catheter tip is in the superior vena cava. Feeding tube tip is not seen; feeding tube tip is below the diaphragm. No pneumothorax. There is stable cardiomegaly with pulmonary venous hypertension. There is interstitial edema, more on the left than on the right, stable. There is patchy consolidation in the left lower lobe, stable. No new opacity. No adenopathy. IMPRESSION: Evidence of a degree of congestive  heart failure. Question superimposed pneumonia left lower lobe. No new opacity. No change in cardiac silhouette. Tube and catheter positions as described. Electronically Signed   By: Bretta Bang III M.D.   On: 10/19/2015 07:17   Dg Chest 1 View  10/18/2015  CLINICAL DATA:  Dyspnea. EXAM: CHEST 1 VIEW COMPARISON:  October 16, 2015. FINDINGS: Stable cardiomediastinal silhouette. Tracheostomy and feeding tube are unchanged in position. Right internal jugular dialysis catheter is unchanged in position. Left internal jugular central venous catheter is unchanged with distal tip in expected position of the SVC. No pneumothorax is noted. Mild central pulmonary vascular congestion is again noted. Stable left perihilar and basilar opacity is noted concerning for pneumonia or edema with possible minimal left pleural effusion. Stable mild right basilar opacity is noted. Bony thorax is intact. IMPRESSION: Stable support apparatus. Stable bilateral lung opacities, with left greater than  right. Electronically Signed   By: Lupita Raider, M.D.   On: 10/18/2015 07:38   Dg Abd 1 View  11/04/2015  CLINICAL DATA:  Feeding tube placement EXAM: ABDOMEN - 1 VIEW COMPARISON:  September 25, 2015 FINDINGS: Feeding tube tip is in the body of the stomach. The bowel gas pattern is normal. There is extensive intraperitoneal barium, a stable finding. Sclerotic lesions are identified in the iliac crests, stable. IMPRESSION: Feeding tube tip in body of stomach. Areas of intraperitoneal barium are consistent with prior bowel perforation. Sclerotic areas in the bony structures 9 potentially be due to long-term chronic renal failure. Bony metastatic disease is a differential consideration, however. Electronically Signed   By: Bretta Bang III M.D.   On: 11/04/2015 10:24   US Venous Img Lower Bilateral  11/03/2015  CLINICAL DATA:  Swelling for 3 months. EXAM: BILATERAL LOWER EXTREMITY VENOUS DOPPLER ULTRASOUND TECHNIQUE: Gray-scale sonography with graded compression, as well as color Doppler and duplex ultrasound were performed to evaluate the lower extremity deep venous systems from the level of the common femoral vein and including the common femoral, femoral, profunda femoral, popliteal and calf veins including the posterior tibial, peroneal and gastrocnemius veins when visible. The superficial great saphenous vein was also interrogated. Spectral Doppler was utilized to evaluate flow at rest and with distal augmentation maneuvers in the common femoral, femoral and popliteal veins. COMPARISON:  08/28/2015 FINDINGS: RIGHT LOWER EXTREMITY Normal compressibility and color Doppler flow in the right common femoral vein. The right profunda femoral vein is patent without thrombus. There is a small amount of echogenic material along the wall of the proximal right femoral vein but this vessel appears to be compressible. This area is difficult to evaluate due to body habitus. Normal compressibility and color Doppler  flow in the remainder of the right femoral vein and the right popliteal vein. Limited evaluation of the deep calf veins due to body habitus but no clear evidence for thrombus. LEFT LOWER EXTREMITY Normal compressibility at the left common femoral vein and left saphenofemoral junction without thrombus. Normal compressibility in the proximal left femoral vein. Limited evaluation of the mid and distal left femoral vein due to body habitus. Normal compressibility in the left popliteal vein without thrombus. Limited evaluation of the calf veins due to body habitus but no clear evidence for thrombus. There is color Doppler flow in the left common femoral vein, profunda femoral vein and visualized left femoral vein. Normal color Doppler flow and augmentation in left popliteal vein. Other Findings:  None. IMPRESSION: Technically challenging examination due to body habitus. No evidence for an acute  DVT in either lower extremity. A small amount of echogenic material along the wall of the proximal right femoral vein is indeterminate. This could represent old nonocclusive thrombus. Electronically Signed   By: Richarda Overlie M.D.   On: 11/03/2015 12:56   Dg Chest Port 1 View  11/09/2015  CLINICAL DATA:  Respiratory failure EXAM: PORTABLE CHEST - 1 VIEW COMPARISON:  11/06/2015 FINDINGS: Cardiac shadow remains enlarged. Patchy infiltrates are noted bilaterally and stable. A left jugular PICC line, dialysis catheter, tracheostomy tube and feeding catheter are again seen. Mild deformity of the dialysis catheter is noted at its turned in the neck. Correlation with any limitations of flow is recommended. No bony abnormality is seen. IMPRESSION: Patchy infiltrates bilaterally. The overall appearance is stable from the prior study. Electronically Signed   By: Alcide Clever M.D.   On: 11/09/2015 16:06   Dg Chest Port 1 View  10/26/2015  CLINICAL DATA:  Respiratory failure, aspiration pneumonia, septic shock, CVA, history of coronary  artery disease CHF and renal insufficiency EXAM: PORTABLE CHEST 1 VIEW COMPARISON:  Portable chest x-ray of October 19, 2015 FINDINGS: The lungs are adequately inflated. There are persistent confluent alveolar opacities bilaterally. The left hemidiaphragm is partially obscured but slightly better demonstrated than on the previous study. The cardiac silhouette remains enlarged. The central pulmonary vascularity remains engorged. A dual-lumen dialysis catheter is in stable position with the distal tip at the cavoatrial junction. The feeding tube tip projects below the inferior margin of the image. The left internal jugular venous catheter tip projects over the proximal SVC. The tracheostomy appliance tip lies at the level of the superior margin of the clavicular heads. IMPRESSION: Stable bilateral alveolar opacities compatible with pneumonia and/or pulmonary edema. Slight improved aeration of the left lower lobe. The support tubes are in reasonable position. Electronically Signed   By: David  [place] M.D.   On: 10/26/2015 07:15   Dg Chest Port 1 View  10/16/2015  CLINICAL DATA:  Respiratory failure EXAM: PORTABLE CHEST 1 VIEW COMPARISON:  10/09/2015 FINDINGS: Tracheostomy tube and NG tube are unchanged. LEFT central venous line and RIGHT hemodialysis catheter unchanged. Stable cardiac silhouette. There is new airspace disease in the LEFT lower lobe. Small bilateral pleural effusions. No pneumothorax. IMPRESSION: 1. Stable support apparatus. 2. New LEFT lower lobe airspace disease representing infiltrate or potentially aspiration pneumonitis. Asymmetric edema would also be considered. These results will be called to the ordering clinician or representative by the Radiologist Assistant, and communication documented in the PACS or zVision Dashboard. Electronically Signed   By: Genevive Bi M.D.   On: 10/16/2015 08:34     Assessment and Plan   1. New onset Afib: -Currently in sinus rhythm with heart rate in  the 80s on amiodarone gtt -Will transition to po amiodarone 400 mg bid x 1 week, 200 mg bid x 1 week, then 200 mg daily thereafter -Given her recent history of GIB and anemia with hgb of 7.9 will hold on starting full dose anticoagulation   2. Elevated troponin: -Current elevation likely in the setting of her Afib with RVR -Conservative management at this time given the acuity of the patient's condition   3. Ischemic cardiomyopathy/presumed CAD: -Undermined when/if ischemic event occurred  -Volume management per dialysis -Requiring Levophed, which precludes usage of beta blocker at this time. Could start when/if able -Significant drop in EF between admission echo and echo on 12/19 -Would pursue ischemic evaluation with cardiac cath when medically stable -May need to consider pursuing  palliative care  4. Respiratory failure: -Vent per PCCM    Signed, Eula Listen, PA-C Pager: [phone] 11/10/2015, 1:24 PM

## 2015-11-10 NOTE — Progress Notes (Signed)
    Spoke with patient's husband. Questions were answered and listened to all concerns.   Eula Listenyan Malesha Suliman, PA-C 11/10/2015 2:20 PM

## 2015-11-10 NOTE — Progress Notes (Signed)
PT Cancellation Note  Patient Details Name: Courtney GrinderHedda McMillian Monroe MRN: [id number] DOB: 1965/12/21   Cancelled Treatment:    Reason Eval/Treat Not Completed: Medical issues which prohibited therapy (Chart reviewed for attempted treatment session.  Patient currently on amioderone drip (for new onset Afib during dialysis previous date) and levophed (for pressure support).  Contraindicated for mobility efforts at this time.  Will re-attempt at later time/date as patient medically appropriate and agreeable to treatment.)  Ezmeralda Stefanick H. Manson PasseyBrown, PT, DPT, NCS 11/10/2015, 3:30 PM [phone]

## 2015-11-10 NOTE — Progress Notes (Signed)
Dr Kirke CorinArida notified of a troponin level of 0.28 and that her husband would like to be updated on her cardiac status.

## 2015-11-10 NOTE — Progress Notes (Signed)
MEDICATION RELATED CONSULT NOTE - Follow up   Pharmacy Consult for Renal dosing  Allergies  Allergen Reactions  . Contrast Media [Iodinated Diagnostic Agents] Anaphylaxis  . Ampicillin Rash    Patient Measurements: Ht 455ft 9in Height:  (SCALE BROKE) Weight:  (No bedscale) IBW/kg (Calculated) : 66.2  Vital Signs: Temp: 98.3 F (36.8 C) (12/22 0500) Temp Source: Oral (12/22 0500) BP: 117/43 mmHg (12/22 0600) Pulse Rate: 82 (12/22 0600)  Recent Labs  11/07/15 1750  11/07/15 1810 11/09/15 1307 11/09/15 1534 11/10/15 0930 11/10/15 0933  WBC 14.7*  --   --  17.7*  --  23.1*  --   HGB 7.9*  --   --  7.6*  --  7.9*  --   HCT 26.0*  --   --  25.9*  --  26.5*  --   PLT 209  --   --  216  --  230  --   CREATININE  --   < > 1.73* 1.41* 0.85  --  1.23*  MG  --   --   --   --  1.8  --   --   PHOS  --   --  4.7* 3.3 2.4*  --   --   ALBUMIN  --   --  1.9* 1.8* 2.0*  --   --   PROT  --   --   --   --  6.1*  --   --   AST  --   --   --   --  68*  --   --   ALT  --   --   --   --  43  --   --   ALKPHOS  --   --   --   --  1677*  --   --   BILITOT  --   --   --   --  1.2  --   --   < > = values in this interval not displayed. Estimated Creatinine Clearance: 62.2 mL/min (by C-G formula based on Cr of 1.23).   Assessment: 49 yo patient on HD. Pharmacy consulted for renal dosing of medications.   Plan:  No medications require adjustment at present. Pharmacy will continue to monitor and dose adjust as needed.    Luisa HartScott Wendell Nicoson, PharmD  Clinical Pharmacist 11/10/2015

## 2015-11-10 NOTE — Progress Notes (Signed)
  HEMODIALYSIS FLOWSHEET:  Blood Flow Rate (mL/min): 400 mL/min Arterial Pressure (mmHg): -160 mmHg Venous Pressure (mmHg): 160 mmHg Transmembrane Pressure (mmHg): 30 mmHg Ultrafiltration Rate (mL/min): 60 mL/min Dialysate Flow Rate (mL/min): 600 ml/min Conductivity: Machine : 14 Conductivity: Machine : 14 Dialysis Fluid Bolus: Normal Saline Bolus Amount (mL): 200 mL Dialysate Change: 2K (K+ 5.0 & Dr Thedore MinsSingh aware) Intra-Hemodialysis Comments: Tx terminated d/t tachycardia with HR >150s & pt awake & c/o chest pain. Dr Leta SpellerSignh aware via phon eof early tx termination & ICU nurse at bedside with ICU MD.   Patient did ok at the start of dialysis but later became tachycardic and hypotensive Therefore treatment was stopped early  She remains on vent VS: 103/54  Hr 87  R 16 Gen: Critically ill Pulm: vent dependent cvs: pericardial rub Abdo: soft Extr: ++ edema Neuro: follows simple commands  Plan: ESRD- HD  Continue MWF Iv albumin support to prevent hypotension and assist with fluid removal Anasarca- unable to optimize volume status because of hemodynamic instability and hypotension

## 2015-11-10 NOTE — Progress Notes (Signed)
PULMONARY / CRITICAL CARE MEDICINE   Name: Courtney [name] MRN: [id number] DOB: October 28, 1966    ADMISSION DATE:  2015-08-19  BRIEF HISTORY: 49 AAF who has been in medical facilities (hosp, LTAC, rehab) for 2 yrs following gastric bypass surgery with multiple complications. Now with chronic trach, ESRD, profound debilitation, severe sacral pressure ulcer. Was seemingly making progress and transferred to rehab facility approx one week prior to this admission. She was sent to [facility] ED with AMS and hypotension. Working dx of severe sepsis/septic shock due to infected sacral pressure ulcer. Since admission. Her course is been very complicated with numerous complications including septic shock and GI bleeding. Now with failure to wean from vent and possible MI event, repeat ECHO EF 30-35, possible old MI, severe hypokinesis of the nnterior and anteroseptal myocardium   INTERVAL HISTORY: Yesterday developed a tachyarrhythmia during initiation of dialysis, dialysis stopped, got  of cardizem, mild improvement.  HR continued in 160-180s, started on amio gtt.  EKG with A.flutter.  Also, now with Pan resistant Klebsiella on sputum, ID following. Started on levophed, .   Summary of MAJOR EVENTS/TEST RESULTS: Admission 02/07/14-05/07/14 Admission 07/21/14-09/06/14 Discharged to [facility]. Pt had palliative consult at that time, were asked to sign off by husband.  09/23 CT head: NAD 09/23 EEG: no epileptiform activity 09/23 PRBCs for Hgb 6.4 09/24 bedside debridement of sacral wound. Abscess drained 09/25 Off vasopressors. More alert. No distress. Worsening thrombocytopenia. Vanc DC'd 09/29 Dr. Sampson Goon (I.D) excused from the case by patient's husband. 10/02 MRI -multiple infarcts 10/03 tracheal bleeding- transfused platelets 10/03 hospitalist service excused from the case by patient's husband 10/03 Echocardiogram ejection fraction was 55-60%, pulmonary systolic pressure was 39 mmHg 78/29  restart TF's at lower rate, attempt reg HD  10/12 Transferred to med-surg floor. Remains on PCCM service 10/14 SLP eval: pt unable to tolerate PMV adequately 10/17-will re-attempt PM valve-discussed with Speech therapist 10/18 passed swallow eval-start pureed thick foods no thin liquids-continue NG feeds 10/19 transferred to step down for sepsis/aspiration pneumonia 10/19 cxr shows RLL opacity 10/21 sacral decub debride at bedside by surgery 10/22 started back on vasopressors while on HD 10/27 CT with osteo, R hip fx, unable to identify tip of dubhoff tube - sent for fluoro study 10/28 Ortho consultation: I do not feel that she is a surgical candidate. Therefore, I feel that it would best to manage this fracture nonsurgically and allow it to heal by itself over time, which it should.  10/28 Gen Surg consultation: Due to the lack of free air or free flow of contrast and the peritoneum there is no indication for any surgical intervention on this. Would recommend pulling back the feeding tube 1-2 cm. Would recheck an abdominal film to confirm no pneumoperitoneum in the morning. Absence of any changes okay to continue using Dobbhoff for feeding and medications. 10/28 gastrograffin study: The study confirms that the feeding tube pes perforated through the duodenum and the tip is within a cavity that fills with injected contrast. The cavity does appear walled off 11/4 refuses oral feeds, continue TF's CT reviewed: T8-T9 discitis/osteomyelitis, RT HIP FRACTURE 11/5 placed back on vasopressors, placed back on Vent due to resp acidosis, levophed turned off 11/6 afternoon 11/5 PM Trach changed out due to cuff leak, #6 Shiley cuffed, 11/6 dubhoff occluded, removed and replaced, new dubhoff shows tube in the antral stomach 11/15 refusing to take oral feeds 11/18 placed back on Vent for increased WOB,SOB. 11/28 Wound care as re -consulted, there appears to  be a new pocket/fistula around the area of her stage  IV sacral wound. 10/18/2015; the patient developed a new left basal pneumonia; with recurrent sepsis.  10/19/2015; fevers resolved with IV acetaminophen. Tube feeds changed from vital to Jevity.  12/7 CXR with stable b/l opacities.  12/12 elevated K s/p HD-remains on AC mode 12/16-vomiting-hold feeds and re-assess in next 24 hrs 12/17- TF restarted at 1/2 goal (goal = 50cc/per) 12/18- elevated wbc, fever (101.4), recultured, restarted on ceftaz 12/19 Pericardial friction rub. Echocardiogram: LVEF 30-35%, anteroseptal hypokinesis or akinesis c/w anterior wall MI 12/20 Dr Sung Amabile discussed new echocardiogram findings with pt's husband and explained that this likely represents an anterior wall MI sometime in the previous couple of weeks. It was explained that she is not a candidate for any cardiology intervention. Code status was again discussed. Husband expressed understanding that she was not going to survive this illness in any favorable way but continues to request that she be full code status based on what he believes to be her wishes 12/21 vomitied 50cc of TF - AM TF held, restart TF at 3pm @30cc   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 Pneumoniae, P. Mirabilis, VRE Wound 9/26 >> No growth Blood 9/23 >> NEG CDiff 9/27>>neg Stool Cx 10/15>> negative Cdiff 10/25>>neg Trach Aspirate 10/28>> light growth pseudomonas Blood 10/28 >> 1/2 GPC >>  Sputum cultures obtained 09/22/15 due to mucus plugging>>Pseudomonas BONE TISSUE Cx 11/3>>E.coli (ESBL), k. Pneumonia (daptomycin and septra) Bld Cx 11/27>>negative thus far.  Trach Asp  11/27>> grew out Pseudomonas, and Klebsiella pneumonia. Trach Asp 12/18>>PAN RESISTANT Klebsiella, Pseudomonas Bld Cx 12/18>>  ANTIMICROBIALS:  Aztreonam 9/23 >> 9/24 Vanc 9/23 >> 9/25 Vanc 9/26>>9/27 Daptomycin 9/27>> 10/12, 10/28>> off Meropenem 9/23 >> 10/14, 10/28 >> 10/31 Septra 11/8>> Zosyn 11/27,>> 11/30. Levaquin 11/29>> 11/29. Ceftaz 12/1>>12/11, 12/18>> Bactrim - chronic  VITAL SIGNS: Temp:  [97.7 F (36.5 C)-99.4 F (37.4 C)] 98.3 F (36.8 C) (12/22 0500) Pulse Rate:  [71-156] 82 (12/22 0600) Resp:  [0-26] 21 (12/22 0600) BP: (76-127)/(36-100) 117/43 mmHg (12/22 0600) SpO2:  [88 %-100 %] 97 % (12/22 0600) FiO2 (%):  [40 %] 40 % (12/22 0358) HEMODYNAMICS:   VENTILATOR SETTINGS: Vent Mode:  [-] PRVC FiO2 (%):  [40 %] 40 % Set Rate:  [14 bmp] 14 bmp Vt Set:  [530 mL] 530 mL PEEP:  [5 cmH20] 5 cmH20 INTAKE / OUTPUT:  Intake/Output Summary (Last 24 hours) at 11/10/15 4782 Last data filed at 11/10/15 0400  Gross per 24 hour  Intake 1134.15 ml  Output   -119 ml  Net 1253.15 ml    ROS Unable to fully obtain, due to trach and critical illness Physical Exam  Vent dependent Flat affect HEENT WNL, NGT present Trach site clean Chest clear anteriorly Regular rhythm, rate normal, could not appreciate friction rub today Abdomen soft, + BS Ext cool, trace symmetric edema Severe sacral decubitus ulcer - site looks clean 12/21 Profoundly weak with muscle wasting  LABS:  CBC  Recent Labs Lab 11/06/15 0819 11/07/15 1750 11/09/15 1307  WBC 15.7* 14.7* 17.7*  HGB 6.3* 7.9* 7.6*  HCT 21.2* 26.0* 25.9*  PLT 208 209 216   Coag's  No results for input(s): APTT, INR in the last 168 hours. BMET  Recent Labs Lab 11/07/15 1810 11/09/15 1307 11/09/15 1534  NA 146* 144 143  K 5.0 4.3 3.4*  CL 104 105 100*  CO2 34* 33* 33*  BUN 80* 64* 39*  CREATININE 1.73* 1.41* 0.85  GLUCOSE 116* 98 92   Electrolytes  Recent Labs Lab 11/04/15 0548  11/07/15 1810 11/09/15 1307 11/09/15 1534  CALCIUM 8.5* 8.7* 8.5* 8.4*  MG 2.1  --   --  1.8  PHOS 4.5 4.7* 3.3 2.4*   Sepsis Markers No results for input(s): LATICACIDVEN, PROCALCITON, O2SATVEN in the last 168 hours. ABG No results for input(s): PHART, PCO2ART, PO2ART in the last 168 hours. Liver Enzymes  Recent Labs Lab 11/07/15 1810 11/09/15 1307 11/09/15 1534  AST  --   --  68*  ALT  --   --  43  ALKPHOS  --   --  1677*  BILITOT  --   --  1.2  ALBUMIN 1.9* 1.8* 2.0*   Cardiac Enzymes  Recent Labs Lab 11/09/15 1534 11/10/15 0125  TROPONINI 0.11* 0.08*   Glucose  Recent Labs Lab 11/09/15 0733 11/09/15 1137 11/09/15 1619 11/09/15 1950 11/09/15 2331 11/10/15 0418  GLUCAP 86 70 85 87 96 95    Imaging Dg Chest Port 1 View  11/09/2015  CLINICAL DATA:  Respiratory failure EXAM: PORTABLE CHEST - 1 VIEW COMPARISON:  11/06/2015 FINDINGS: Cardiac shadow remains enlarged. Patchy infiltrates are noted bilaterally and stable. A left jugular PICC line, dialysis catheter, tracheostomy tube and feeding catheter are again seen. Mild deformity of the dialysis catheter is noted at its turned in the neck. Correlation with any limitations of flow is recommended. No bony abnormality is seen. IMPRESSION: Patchy infiltrates bilaterally. The overall appearance is stable from the prior study. Electronically Signed   By: Alcide Clever M.D.   On: 11/09/2015 16:06    ASSESSMENT / PLAN: IMPRESSION: Very prolonged and complicated hospitalization after gastric bypass surgery 2014 Never returned home ESRD Recurrent severe sepsis - PAN resistant Klebisella in sputum Recurrent PNA H/O C diff H/O VTE DM2 - controlled Episodic hypoglycemia Chronic steroid therapy, for a history of of adrenal insufficiency Incidental finding of R hip fx - conservative mgmt. 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 Flutter.  Cards following  Husband updated via phone   Thank you for consulting [facility], Please feel free to contacts [place] with any questions at [phone] (please enter 7-digits).  I have personally obtained a history, examined the patient, evaluated laboratory and imaging results, formulated the assessment and plan and placed orders.  The Patient requires high complexity decision making for assessment and support, frequent evaluation and titration of therapies, application of advanced monitoring technologies and  extensive interpretation of multiple databases.  Critical Care Time devoted to patient care services described in this note is 35 minutes.   Overall, patient is critically ill, prognosis is guarded. Patient at high risk for cardiac arrest and death.    Stephanie AcreVishal Mariesha Venturella, MD [facility] Pager [phone] (please enter 7-digits) On Call Pager - [phone] (please enter 7-digits)  Note: This note was prepared with Dragon dictation along with smaller phrase technology. Any transcriptional errors that result from this process are unintentional.

## 2015-11-10 NOTE — Progress Notes (Signed)
Subjective:   Patient did not tolerate dialysis well yesterday She became tachycardic with heart rate over 150 and became hypotensive Treatment had to be stopped early  No fluid could be removed  This morning, her hemodynamics are more stable    Objective:  Vital signs in last 24 hours:  Temp:  [97.7 F (36.5 C)-99.1 F (37.3 C)] 98.3 F (36.8 C) (12/22 0500) Pulse Rate:  [71-156] 82 (12/22 0600) Resp:  [0-26] 21 (12/22 0600) BP: (76-127)/(36-100) 117/43 mmHg (12/22 0600) SpO2:  [88 %-100 %] 97 % (12/22 0600) FiO2 (%):  [40 %] 40 % (12/22 0358)  Weight change:  Filed Weights   11/08/15 0428 11/09/15 0454  Weight: 80.6 kg (177 lb 11.1 oz) 80.6 kg (177 lb 11.1 oz)    Intake/Output: I/O last 3 completed shifts: In: 1724.2 [I.V.:699.2; NG/GT:975; IV Piggyback:50] Out: -119 [Emesis/NG output:1]   Intake/Output this shift:  Total I/O In: 20 [I.V.:20] Out: -   Physical Exam: General: critically ill appearing  Head/ENT: OM moist, NG tube  Eyes: Anicteric, eyes open  Neck: Tracheostomy in place  Lungs:  Scattered rhonchi, vent assisted  PRVC fio2 40%  Heart: S1S2, + pericardial rub  Abdomen:  Soft, nontender, BS present  Extremities: 3+ dependent edema, anasarca  Neurologic:  able to follow simple commands  Access:  R IJ permcath.       Basic Metabolic Panel:  Recent Labs Lab 11/04/15 0548 11/07/15 1810 11/09/15 1307 11/09/15 1534  NA 145 146* 144 143  K 5.0 5.0 4.3 3.4*  CL 103 104 105 100*  CO2 33* 34* 33* 33*  GLUCOSE 132* 116* 98 92  BUN 76* 80* 64* 39*  CREATININE 1.31* 1.73* 1.41* 0.85  CALCIUM 8.5* 8.7* 8.5* 8.4*  MG 2.1  --   --  1.8  PHOS 4.5 4.7* 3.3 2.4*    Liver Function Tests:  Recent Labs Lab 11/07/15 1810 11/09/15 1307 11/09/15 1534  AST  --   --  68*  ALT  --   --  43  ALKPHOS  --   --  1677*  BILITOT  --   --  1.2  PROT  --   --  6.1*  ALBUMIN 1.9* 1.8* 2.0*   No results for input(s): LIPASE, AMYLASE in the last 168  hours. No results for input(s): AMMONIA in the last 168 hours.  CBC:  Recent Labs Lab 11/04/15 0548 11/06/15 0819 11/07/15 1750 11/09/15 1307  WBC 15.7* 15.7* 14.7* 17.7*  HGB 7.3* 6.3* 7.9* 7.6*  HCT 24.1* 21.2* 26.0* 25.9*  MCV 98.2 98.2 95.9 97.0  PLT 213 208 209 216    Cardiac Enzymes:  Recent Labs Lab 11/09/15 1534 11/10/15 0125  CKTOTAL 5* 5*  TROPONINI 0.11* 0.08*    BNP: Invalid input(s): POCBNP  CBG:  Recent Labs Lab 11/09/15 1619 11/09/15 1950 11/09/15 2331 11/10/15 0418 11/10/15 0746  GLUCAP 85 87 96 95 80    Microbiology: Results for orders placed or performed during the hospital encounter of 09/06/2015  Blood Culture (routine x 2)     Status: None   Collection Time: 09/06/15  8:51 AM  Result Value Ref Range Status   Specimen Description BLOOD [name]  Final   Special Requests BOTTLES DRAWN AEROBIC AND ANAEROBIC  3CC  Final   Culture NO GROWTH 5 DAYS  Final   Report Status 08/17/2015 FINAL  Final  Blood Culture (routine x 2)     Status: None   Collection Time: 09/06/2015  9:20  AM  Result Value Ref Range Status   Specimen Description BLOOD LEFT ARM  Final   Special Requests BOTTLES DRAWN AEROBIC AND ANAEROBIC  1CC  Final   Culture NO GROWTH 5 DAYS  Final   Report Status 08/17/2015 FINAL  Final  Wound culture     Status: None   Collection Time: 08/11/2015  9:20 AM  Result Value Ref Range Status   Specimen Description DECUBITIS  Final   Special Requests Normal  Final   Gram Stain   Final    FEW WBC SEEN MANY GRAM NEGATIVE RODS RARE GRAM POSITIVE COCCI    Culture   Final    HEAVY GROWTH ESCHERICHIA COLI MODERATE GROWTH ENTEROBACTER AEROGENES PROTEUS MIRABILIS HEAVY GROWTH ENTEROCOCCUS SPECIES VRE HAVE INTRINSIC RESISTANCE TO MOST COMMONLY USED ANTIBIOTICS AND THE ABILITY TO ACQUIRE RESISTANCE TO MOST AVAILABLE ANTIBIOTICS.    Report Status 08/16/2015 FINAL  Final   Organism ID, Bacteria ESCHERICHIA COLI  Final   Organism ID, Bacteria  ENTEROBACTER AEROGENES  Final   Organism ID, Bacteria PROTEUS MIRABILIS  Final   Organism ID, Bacteria ENTEROCOCCUS SPECIES  Final      Susceptibility   Enterobacter aerogenes - MIC*    CEFTAZIDIME <=1 SENSITIVE Sensitive     CEFAZOLIN >=64 RESISTANT Resistant     CEFTRIAXONE <=1 SENSITIVE Sensitive     CIPROFLOXACIN <=0.25 SENSITIVE Sensitive     GENTAMICIN <=1 SENSITIVE Sensitive     IMIPENEM 1 SENSITIVE Sensitive     TRIMETH/SULFA <=20 SENSITIVE Sensitive     * MODERATE GROWTH ENTEROBACTER AEROGENES   Escherichia coli - MIC*    AMPICILLIN <=2 SENSITIVE Sensitive     CEFTAZIDIME <=1 SENSITIVE Sensitive     CEFAZOLIN <=4 SENSITIVE Sensitive     CEFTRIAXONE <=1 SENSITIVE Sensitive     CIPROFLOXACIN <=0.25 SENSITIVE Sensitive     GENTAMICIN <=1 SENSITIVE Sensitive     IMIPENEM <=0.25 SENSITIVE Sensitive     TRIMETH/SULFA <=20 SENSITIVE Sensitive     * HEAVY GROWTH ESCHERICHIA COLI   Proteus mirabilis - MIC*    AMPICILLIN >=32 RESISTANT Resistant     CEFTAZIDIME <=1 SENSITIVE Sensitive     CEFAZOLIN 8 SENSITIVE Sensitive     CEFTRIAXONE <=1 SENSITIVE Sensitive     CIPROFLOXACIN <=0.25 SENSITIVE Sensitive     GENTAMICIN <=1 SENSITIVE Sensitive     IMIPENEM 1 SENSITIVE Sensitive     TRIMETH/SULFA <=20 SENSITIVE Sensitive     * PROTEUS MIRABILIS   Enterococcus species - MIC*    AMPICILLIN >=32 RESISTANT Resistant     VANCOMYCIN >=32 RESISTANT Resistant     GENTAMICIN SYNERGY SENSITIVE Sensitive     TETRACYCLINE Value in next row Resistant      RESISTANT>=16    * HEAVY GROWTH ENTEROCOCCUS SPECIES  MRSA PCR Screening     Status: None   Collection Time: 08/11/2015  2:38 PM  Result Value Ref Range Status   MRSA by PCR NEGATIVE NEGATIVE Final    Comment:        The GeneXpert MRSA Assay (FDA approved for NASAL specimens only), is one component of a comprehensive MRSA colonization surveillance program. It is not intended to diagnose MRSA infection nor to guide or monitor  treatment for MRSA infections.   Blood culture (single)     Status: None   Collection Time: 07/27/2015  3:36 PM  Result Value Ref Range Status   Specimen Description BLOOD RIGHT ASSIST CONTROL  Final   Special Requests BOTTLES DRAWN AEROBIC AND  ANAEROBIC  Final   Culture NO GROWTH 5 DAYS  Final   Report Status 08/17/2015 FINAL  Final  Wound culture     Status: None   Collection Time: 08/13/15 12:37 PM  Result Value Ref Range Status   Specimen Description WOUND  Final   Special Requests Normal  Final   Gram Stain   Final    FEW WBC SEEN TOO NUMEROUS TO COUNT GRAM NEGATIVE RODS FEW GRAM POSITIVE COCCI    Culture   Final    HEAVY GROWTH ESCHERICHIA COLI MODERATE GROWTH PROTEUS MIRABILIS LIGHT GROWTH KLEBSIELLA PNEUMONIAE MODERATE GROWTH ENTEROCOCCUS GALLINARUM CRITICAL RESULT CALLED TO, READ BACK BY AND VERIFIED WITH: [facility] BORBA AT 1042 08/16/15 DV    Report Status 08/17/2015 FINAL  Final   Organism ID, Bacteria ESCHERICHIA COLI  Final   Organism ID, Bacteria PROTEUS MIRABILIS  Final   Organism ID, Bacteria KLEBSIELLA PNEUMONIAE  Final   Organism ID, Bacteria ENTEROCOCCUS GALLINARUM  Final      Susceptibility   Escherichia coli - MIC*    AMPICILLIN >=32 RESISTANT Resistant     CEFTAZIDIME 4 RESISTANT Resistant     CEFAZOLIN >=64 RESISTANT Resistant     CEFTRIAXONE 16 RESISTANT Resistant     GENTAMICIN 2 SENSITIVE Sensitive     IMIPENEM >=16 RESISTANT Resistant     TRIMETH/SULFA <=20 SENSITIVE Sensitive     Extended ESBL POSITIVE Resistant     PIP/TAZO Value in next row Resistant      RESISTANT>=128    CIPROFLOXACIN Value in next row Sensitive      SENSITIVE<=0.25    * HEAVY GROWTH ESCHERICHIA COLI   Klebsiella pneumoniae - MIC*    AMPICILLIN Value in next row Resistant      SENSITIVE<=0.25    CEFTAZIDIME Value in next row Resistant      SENSITIVE<=0.25    CEFAZOLIN Value in next row Resistant      SENSITIVE<=0.25    CEFTRIAXONE Value in next row Resistant       SENSITIVE<=0.25    CIPROFLOXACIN Value in next row Resistant      SENSITIVE<=0.25    GENTAMICIN Value in next row Sensitive      SENSITIVE<=0.25    IMIPENEM Value in next row Resistant      SENSITIVE<=0.25    TRIMETH/SULFA Value in next row Resistant      SENSITIVE<=0.25    PIP/TAZO Value in next row Resistant      RESISTANT>=128    * LIGHT GROWTH KLEBSIELLA PNEUMONIAE   Proteus mirabilis - MIC*    AMPICILLIN Value in next row Resistant      RESISTANT>=128    CEFTAZIDIME Value in next row Sensitive      RESISTANT>=128    CEFAZOLIN Value in next row Sensitive      RESISTANT>=128    CEFTRIAXONE Value in next row Sensitive      RESISTANT>=128    CIPROFLOXACIN Value in next row Sensitive      RESISTANT>=128    GENTAMICIN Value in next row Sensitive      RESISTANT>=128    IMIPENEM Value in next row Sensitive      RESISTANT>=128    TRIMETH/SULFA Value in next row Sensitive      RESISTANT>=128    PIP/TAZO Value in next row Sensitive      SENSITIVE<=4    * MODERATE GROWTH PROTEUS MIRABILIS   Enterococcus gallinarum - MIC*    AMPICILLIN Value in next row Resistant  SENSITIVE<=4    GENTAMICIN SYNERGY Value in next row Sensitive      SENSITIVE<=4    CIPROFLOXACIN Value in next row Resistant      RESISTANT>=8    TETRACYCLINE Value in next row Resistant      RESISTANT>=16    * MODERATE GROWTH ENTEROCOCCUS GALLINARUM  Wound culture     Status: None   Collection Time: 08/15/15  2:53 PM  Result Value Ref Range Status   Specimen Description WOUND  Final   Special Requests NONE  Final   Gram Stain FEW WBC SEEN NO ORGANISMS SEEN   Final   Culture NO GROWTH 3 DAYS  Final   Report Status 08/18/2015 FINAL  Final  C difficile quick scan w PCR reflex     Status: None   Collection Time: 08/17/15 11:34 AM  Result Value Ref Range Status   C Diff antigen NEGATIVE NEGATIVE Final   C Diff toxin NEGATIVE NEGATIVE Final   C Diff interpretation Negative for C. difficile  Final  Stool  culture     Status: None   Collection Time: 09/03/15  3:51 PM  Result Value Ref Range Status   Specimen Description STOOL  Final   Special Requests Immunocompromised  Final   Culture   Final    NO SALMONELLA OR SHIGELLA ISOLATED No Pathogenic E. coli detected NO CAMPYLOBACTER DETECTED    Report Status 09/07/2015 FINAL  Final  C difficile quick scan w PCR reflex     Status: None   Collection Time: 09/13/15 12:51 AM  Result Value Ref Range Status   C Diff antigen NEGATIVE NEGATIVE Final   C Diff toxin NEGATIVE NEGATIVE Final   C Diff interpretation Negative for C. difficile  Final  Culture, blood (routine x 2)     Status: None   Collection Time: 09/16/15 11:24 AM  Result Value Ref Range Status   Specimen Description BLOOD LEFT HAND  Final   Special Requests BOTTLES DRAWN AEROBIC AND ANAEROBIC  1CC  Final   Culture NO GROWTH 5 DAYS  Final   Report Status 09/21/2015 FINAL  Final  Culture, blood (routine x 2)     Status: None   Collection Time: 09/16/15 12:23 PM  Result Value Ref Range Status   Specimen Description BLOOD RIGHT HAND  Final   Special Requests BOTTLES DRAWN AEROBIC AND ANAEROBIC  1CC  Final   Culture  Setup Time   Final    GRAM POSITIVE COCCI AEROBIC BOTTLE ONLY CRITICAL RESULT CALLED TO, READ BACK BY AND VERIFIED WITH: TESS THOMAS,RN 09/17/2015 0631 BY JRS.    Culture   Final    ENTEROCOCCUS FAECALIS AEROBIC BOTTLE ONLY VRE HAVE INTRINSIC RESISTANCE TO MOST COMMONLY USED ANTIBIOTICS AND THE ABILITY TO ACQUIRE RESISTANCE TO MOST AVAILABLE ANTIBIOTICS. CRITICAL RESULT CALLED TO, READ BACK BY AND VERIFIED WITH: CHERYL SMITH AT 1610 09/19/15 DV    Report Status 09/21/2015 FINAL  Final   Organism ID, Bacteria ENTEROCOCCUS FAECALIS  Final      Susceptibility   Enterococcus faecalis - MIC*    AMPICILLIN <=2 SENSITIVE Sensitive     LINEZOLID 2 SENSITIVE Sensitive     CIPROFLOXACIN Value in next row Resistant      RESISTANT>=8    TETRACYCLINE Value in next row  Resistant      RESISTANT>=16    VANCOMYCIN Value in next row Resistant      RESISTANT>=32    GENTAMICIN SYNERGY Value in next row Resistant  RESISTANT>=32    * ENTEROCOCCUS FAECALIS  Culture, respiratory (NON-Expectorated)     Status: None   Collection Time: 09/16/15  3:50 PM  Result Value Ref Range Status   Specimen Description TRACHEAL ASPIRATE  Final   Special Requests Immunocompromised  Final   Gram Stain   Final    FEW WBC SEEN GOOD SPECIMEN - 80-90% WBCS RARE GRAM NEGATIVE RODS    Culture LIGHT GROWTH PSEUDOMONAS AERUGINOSA  Final   Report Status 09/19/2015 FINAL  Final   Organism ID, Bacteria PSEUDOMONAS AERUGINOSA  Final      Susceptibility   Pseudomonas aeruginosa - MIC*    CEFTAZIDIME 8 SENSITIVE Sensitive     CIPROFLOXACIN 2 INTERMEDIATE Intermediate     GENTAMICIN >=16 RESISTANT Resistant     IMIPENEM >=16 RESISTANT Resistant     PIP/TAZO Value in next row Sensitive      SENSITIVE32    CEFEPIME Value in next row Sensitive      SENSITIVE8    LEVOFLOXACIN Value in next row Resistant      RESISTANT>=8    * LIGHT GROWTH PSEUDOMONAS AERUGINOSA  Culture, expectorated sputum-assessment     Status: None   Collection Time: 09/22/15  2:09 PM  Result Value Ref Range Status   Specimen Description ENDOTRACHEAL  Final   Special Requests Normal  Final   Sputum evaluation THIS SPECIMEN IS ACCEPTABLE FOR SPUTUM CULTURE  Final   Report Status 09/24/2015 FINAL  Final  Culture, respiratory (NON-Expectorated)     Status: None   Collection Time: 09/22/15  2:09 PM  Result Value Ref Range Status   Specimen Description ENDOTRACHEAL  Final   Special Requests Normal Reflexed from [id number]  Final   Gram Stain   Final    FEW WBC SEEN FEW GRAM NEGATIVE RODS POOR SPECIMEN - LESS THAN 70% WBCS    Culture   Final    MODERATE GROWTH PSEUDOMONAS AERUGINOSA LIGHT GROWTH KLEBSIELLA PNEUMONIAE REFER TO SENSITIVITIES FROM PREVIOUS CULTURE FOR ORG 2    Report Status 10/01/2015 FINAL   Final   Organism ID, Bacteria PSEUDOMONAS AERUGINOSA  Final   Organism ID, Bacteria KLEBSIELLA PNEUMONIAE  Final      Susceptibility   Pseudomonas aeruginosa - MIC*    CEFTAZIDIME 4 SENSITIVE Sensitive     CIPROFLOXACIN 2 INTERMEDIATE Intermediate     GENTAMICIN >=16 RESISTANT Resistant     IMIPENEM >=16 RESISTANT Resistant     PIP/TAZO Value in next row Sensitive      SENSITIVE16    * MODERATE GROWTH PSEUDOMONAS AERUGINOSA  Tissue culture     Status: None   Collection Time: 09/24/15  6:44 AM  Result Value Ref Range Status   Specimen Description BONE  Final   Special Requests Normal  Final   Gram Stain MODERATE WBC SEEN FEW GRAM NEGATIVE RODS   Final   Culture   Final    MODERATE GROWTH ESCHERICHIA COLI MODERATE GROWTH KLEBSIELLA PNEUMONIAE ESBL-EXTENDED SPECTRUM BETA LACTAMASE-THE ORGANISM IS RESISTANT TO PENICILLINS, CEPHALOSPORINS AND AZTREONAM ACCORDING TO CLSI M100-S15 VOL.25 N01 JAN 2005. ORGANISM 1 This organism isolate is resistant to one or more antiotic agents in three or more antimicrobial categories.  Suggest Infectious Disease consult.   ORGANISM 2 CRITICAL RESULT CALLED TO, READ BACK BY AND VERIFIED WITH: RN Mardene CelesteJOANNA LINDSAY 09/27/15 1005AM    Report Status 09/28/2015 FINAL  Final   Organism ID, Bacteria ESCHERICHIA COLI  Final   Organism ID, Bacteria KLEBSIELLA PNEUMONIAE  Final  Susceptibility   Escherichia coli - MIC*    AMPICILLIN >=32 RESISTANT Resistant     CEFTAZIDIME 4 RESISTANT Resistant     CEFAZOLIN >=64 RESISTANT Resistant     CEFTRIAXONE 8 RESISTANT Resistant     GENTAMICIN <=1 SENSITIVE Sensitive     IMIPENEM 8 RESISTANT Resistant     TRIMETH/SULFA <=20 SENSITIVE Sensitive     Extended ESBL POSITIVE Resistant     PIP/TAZO Value in next row Resistant      RESISTANT>=128    * MODERATE GROWTH ESCHERICHIA COLI   Klebsiella pneumoniae - MIC*    AMPICILLIN Value in next row Resistant      RESISTANT>=128    CEFTAZIDIME Value in next row Resistant       RESISTANT>=128    CEFAZOLIN Value in next row Resistant      RESISTANT>=128    CEFTRIAXONE Value in next row Resistant      RESISTANT>=128    CIPROFLOXACIN Value in next row Resistant      RESISTANT>=128    GENTAMICIN Value in next row Resistant      RESISTANT>=128    IMIPENEM Value in next row Resistant      RESISTANT>=128    TRIMETH/SULFA Value in next row Sensitive      RESISTANT>=128    PIP/TAZO Value in next row Resistant      RESISTANT>=128    * MODERATE GROWTH KLEBSIELLA PNEUMONIAE  Anaerobic culture     Status: None   Collection Time: 09/24/15  3:55 PM  Result Value Ref Range Status   Specimen Description BONE  Final   Special Requests Normal  Final   Culture NO ANAEROBES ISOLATED  Final   Report Status 09/29/2015 FINAL  Final  Culture, fungus without smear     Status: None   Collection Time: 09/24/15  3:55 PM  Result Value Ref Range Status   Specimen Description BONE  Final   Special Requests Normal  Final   Culture NO FUNGUS ISOLATED AFTER 24 DAYS  Final   Report Status 10/18/2015 FINAL  Final  C difficile quick scan w PCR reflex     Status: None   Collection Time: 10/07/15  2:02 PM  Result Value Ref Range Status   C Diff antigen NEGATIVE NEGATIVE Final   C Diff toxin NEGATIVE NEGATIVE Final   C Diff interpretation Negative for C. difficile  Final  Culture, blood (routine x 2)     Status: None   Collection Time: 10/16/15  9:52 AM  Result Value Ref Range Status   Specimen Description BLOOD LEFT HAND  Final   Special Requests   Final    BOTTLES DRAWN AEROBIC AND ANAEROBIC  AER 6CC ANA 4CC   Culture NO GROWTH 6 DAYS  Final   Report Status 10/22/2015 FINAL  Final  Culture, blood (routine x 2)     Status: None   Collection Time: 10/16/15 12:25 PM  Result Value Ref Range Status   Specimen Description BLOOD LEFT HAND  Final   Special Requests BOTTLES DRAWN AEROBIC AND ANAEROBIC  5CC  Final   Culture NO GROWTH 6 DAYS  Final   Report Status 10/22/2015 FINAL   Final  Culture, expectorated sputum-assessment     Status: None   Collection Time: 10/18/15  1:15 PM  Result Value Ref Range Status   Specimen Description SPUTUM  Final   Special Requests NONE  Final   Sputum evaluation THIS SPECIMEN IS ACCEPTABLE FOR SPUTUM CULTURE  Final  Report Status 10/18/2015 FINAL  Final  Culture, respiratory (NON-Expectorated)     Status: None   Collection Time: 10/18/15  1:15 PM  Result Value Ref Range Status   Specimen Description SPUTUM  Final   Special Requests NONE Reflexed from [id number]  Final   Gram Stain   Final    GOOD SPECIMEN - 80-90% WBCS MODERATE WBC SEEN MANY GRAM NEGATIVE RODS    Culture   Final    HEAVY GROWTH PSEUDOMONAS AERUGINOSA MODERATE GROWTH KLEBSIELLA PNEUMONIAE CRITICAL RESULT CALLED TO, READ BACK BY AND VERIFIED WITHRod Mae: JOANNA LINDSAY AT [id number] 10/21/15 DV KLEBSIELLA PNEUMONIAE This organism isolate is resistant to one or more antiotic agents in three or more antimicrobial categories.  Suggest Infectious Disease  consult.      Report Status 10/22/2015 FINAL  Final   Organism ID, Bacteria PSEUDOMONAS AERUGINOSA  Final   Organism ID, Bacteria KLEBSIELLA PNEUMONIAE  Final      Susceptibility   Klebsiella pneumoniae - MIC*    AMPICILLIN >=32 RESISTANT Resistant     CEFAZOLIN >=64 RESISTANT Resistant     CEFTRIAXONE >=64 RESISTANT Resistant     CIPROFLOXACIN >=4 RESISTANT Resistant     GENTAMICIN >=16 RESISTANT Resistant     IMIPENEM >=16 RESISTANT Resistant     NITROFURANTOIN >=512 RESISTANT Resistant     TRIMETH/SULFA <=20 SENSITIVE Sensitive     * MODERATE GROWTH KLEBSIELLA PNEUMONIAE   Pseudomonas aeruginosa - MIC*    CEFTAZIDIME 4 SENSITIVE Sensitive     CIPROFLOXACIN >=4 RESISTANT Resistant     GENTAMICIN 8 INTERMEDIATE Intermediate     IMIPENEM >=16 RESISTANT Resistant     PIP/TAZO Value in next row Sensitive      SENSITIVE32    * HEAVY GROWTH PSEUDOMONAS AERUGINOSA  C difficile quick scan w PCR reflex     Status: None    Collection Time: 10/18/15  4:39 PM  Result Value Ref Range Status   C Diff antigen NEGATIVE NEGATIVE Final   C Diff toxin NEGATIVE NEGATIVE Final   C Diff interpretation Negative for C. difficile  Final  Culture, blood (Routine X 2) w Reflex to ID Panel     Status: None (Preliminary result)   Collection Time: 11/06/15  8:27 AM  Result Value Ref Range Status   Specimen Description BLOOD RIGHT HAND  Final   Special Requests BOTTLES DRAWN AEROBIC AND ANAEROBIC 3CC  Final   Culture NO GROWTH 3 DAYS  Final   Report Status PENDING  Incomplete  Culture, blood (Routine X 2) w Reflex to ID Panel     Status: None (Preliminary result)   Collection Time: 11/06/15  8:29 AM  Result Value Ref Range Status   Specimen Description BLOOD LEFT HAND  Final   Special Requests BOTTLES DRAWN AEROBIC AND ANAEROBIC  1CC  Final   Culture NO GROWTH 3 DAYS  Final   Report Status PENDING  Incomplete  Culture, expectorated sputum-assessment     Status: None   Collection Time: 11/06/15  8:41 AM  Result Value Ref Range Status   Specimen Description EXPECTORATED SPUTUM  Final   Special Requests Immunocompromised  Final   Sputum evaluation THIS SPECIMEN IS ACCEPTABLE FOR SPUTUM CULTURE  Final   Report Status 11/06/2015 FINAL  Final  Culture, respiratory (NON-Expectorated)     Status: None (Preliminary result)   Collection Time: 11/06/15  8:41 AM  Result Value Ref Range Status   Specimen Description EXPECTORATED SPUTUM  Final   Special Requests Immunocompromised Reflexed from  [id number]  Final   Gram Stain   Final    MANY WBC SEEN MANY GRAM NEGATIVE RODS EXCELLENT SPECIMEN - 90-100% WBCS    Culture   Final    MODERATE GROWTH KLEBSIELLA PNEUMONIAE MODERATE GROWTH PSEUDOMONAS AERUGINOSA CRITICAL RESULT CALLED TO, READ BACK BY AND VERIFIED WITH: DR. FITZGERALD AT 1120 11/09/15 DV DR. FITZGERALD REQUESTED ORGANISM BE SENT OUT FOR TIGECYCLINE AND COLISTIN    Report Status PENDING  Incomplete   Organism ID, Bacteria  KLEBSIELLA PNEUMONIAE  Final   Organism ID, Bacteria PSEUDOMONAS AERUGINOSA  Final      Susceptibility   Klebsiella pneumoniae - MIC*    AMPICILLIN >=32 RESISTANT Resistant     CEFAZOLIN >=64 RESISTANT Resistant     CEFTRIAXONE 32 RESISTANT Resistant     CIPROFLOXACIN >=4 RESISTANT Resistant     GENTAMICIN >=16 RESISTANT Resistant     IMIPENEM >=16 RESISTANT Resistant     NITROFURANTOIN 256 RESISTANT Resistant     TRIMETH/SULFA >=320 RESISTANT Resistant     Extended ESBL POSITIVE Resistant     * MODERATE GROWTH KLEBSIELLA PNEUMONIAE   Pseudomonas aeruginosa - MIC*    CEFTAZIDIME >=64 RESISTANT Resistant     CIPROFLOXACIN 2 INTERMEDIATE Intermediate     GENTAMICIN >=16 RESISTANT Resistant     IMIPENEM >=16 RESISTANT Resistant     * MODERATE GROWTH PSEUDOMONAS AERUGINOSA    Coagulation Studies: No results for input(s): LABPROT, INR in the last 72 hours.  Urinalysis: No results for input(s): COLORURINE, LABSPEC, PHURINE, GLUCOSEU, HGBUR, BILIRUBINUR, KETONESUR, PROTEINUR, UROBILINOGEN, NITRITE, LEUKOCYTESUR in the last 72 hours.  Invalid input(s): APPERANCEUR    Imaging: Dg Chest Port 1 View  11/09/2015  CLINICAL DATA:  Respiratory failure EXAM: PORTABLE CHEST - 1 VIEW COMPARISON:  11/06/2015 FINDINGS: Cardiac shadow remains enlarged. Patchy infiltrates are noted bilaterally and stable. A left jugular PICC line, dialysis catheter, tracheostomy tube and feeding catheter are again seen. Mild deformity of the dialysis catheter is noted at its turned in the neck. Correlation with any limitations of flow is recommended. No bony abnormality is seen. IMPRESSION: Patchy infiltrates bilaterally. The overall appearance is stable from the prior study. Electronically Signed   By: Alcide Clever M.D.   On: 11/09/2015 16:06     Medications:   . amiodarone 30 mg/hr (11/10/15 0600)  . dextrose 5 % and 0.9% NaCl 25 mL/hr at 11/10/15 0600  . feeding supplement (JEVITY 1.5 CAL/FIBER) 1,000 mL  (11/10/15 0914)  . norepinephrine 4 mcg/min (11/10/15 0600)   . antiseptic oral rinse  7 mL Mouth Rinse QID  . aspirin  81 mg Oral Daily  . cefTAZidime (FORTAZ)  IV  1 g Intravenous Q24H  . chlorhexidine gluconate  15 mL Mouth Rinse BID  . epoetin (EPOGEN/PROCRIT) injection  10,000 Units Intravenous Q M,W,F-HD  . escitalopram  10 mg Oral Daily  . feeding supplement (PRO-STAT SUGAR FREE 64)  30 mL Oral 6 X Daily  . folic acid  1 mg Oral Daily  . free water  20 mL Per Tube 6 times per day  . heparin subcutaneous  5,000 Units Subcutaneous Q12H  . hydrocortisone  10 mg Per Tube BID  . insulin aspart  0-9 Units Subcutaneous 6 times per day  . lidocaine  1 patch Transdermal Q24H  . loperamide  2 mg Oral BID  . midodrine  5 mg Per Tube TID WC  . mirtazapine  7.5 mg Oral QHS  . multivitamin  5 mL Oral Daily  .  pantoprazole sodium  40 mg Per Tube Daily  . saccharomyces boulardii  250 mg Oral BID  . sodium chloride  10-40 mL Intracatheter Q12H  . sodium hypochlorite   Irrigation BID  . thiamine  100 mg Per Tube Daily   acetaminophen (TYLENOL) oral liquid 160 mg/5 mL, HYDROmorphone (DILAUDID) injection, ipratropium-albuterol, morphine injection, ondansetron (ZOFRAN) IV, oxyCODONE, polyvinyl alcohol, sodium chloride, zinc oxide  Assessment/ Plan:  49 y.o. 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 Admitted on 2015/09/06  Echo:  11/07/19: moderately to severely reduced. The estimated ejection fraction was in the range of 30% to 35%. Regional wall motion abnormalities: Severe hypokinesis of the anterior and anteroseptal myocardium.  1. End-stage renal disease on hemodialysis on HD MWF. The patient has been on dialysis since October of 2014. R IJ permcath. Dialysis treatment with IV albumin for oncotic support. Midodrine and hydrocortisone ordered for blood pressure support.  - Continue MWF schedule.     - Next treatment Friday - Did not do well with dialysis on Wednesday  2. Anemia of CKD. Hemoglobin 7.6 - Continue scheduled Epogen with dialysis.  -  1 unit PRBC  Given 12/18  3. Acute resp failure:  -Patient remains on the ventilator at this point in time.   4. Sepsis: VRE in blood. Bone cultures E. Coli and klebsiella  - Treatment as per ID and ICU team:     5. Secondary hyperparathyroidism: PTH 50, phosphorus and calcium at goal.  - no indication for vitamin D agents      LOS: 90 Elain Wixon 12/22/201610:08 AM

## 2015-11-11 DIAGNOSIS — I471 Supraventricular tachycardia: Secondary | ICD-10-CM | POA: Insufficient documentation

## 2015-11-11 DIAGNOSIS — Z9911 Dependence on respirator [ventilator] status: Secondary | ICD-10-CM | POA: Insufficient documentation

## 2015-11-11 DIAGNOSIS — N186 End stage renal disease: Secondary | ICD-10-CM | POA: Insufficient documentation

## 2015-11-11 LAB — CULTURE, BLOOD (ROUTINE X 2)
CULTURE: NO GROWTH
Culture: NO GROWTH

## 2015-11-11 LAB — CBC WITH DIFFERENTIAL/PLATELET
Basophils Absolute: 0 10*3/uL (ref 0–0.1)
Basophils Relative: 0 %
EOS PCT: 1 %
Eosinophils Absolute: 0.2 10*3/uL (ref 0–0.7)
HEMATOCRIT: 25.1 % — AB (ref 35.0–47.0)
HEMOGLOBIN: 7.6 g/dL — AB (ref 12.0–16.0)
LYMPHS ABS: 1.4 10*3/uL (ref 1.0–3.6)
LYMPHS PCT: 7 %
MCH: 29.3 pg (ref 26.0–34.0)
MCHC: 30.3 g/dL — ABNORMAL LOW (ref 32.0–36.0)
MCV: 96.6 fL (ref 80.0–100.0)
Monocytes Absolute: 1 10*3/uL — ABNORMAL HIGH (ref 0.2–0.9)
Monocytes Relative: 5 %
NEUTROS PCT: 87 %
Neutro Abs: 16.7 10*3/uL — ABNORMAL HIGH (ref 1.4–6.5)
Platelets: 217 10*3/uL (ref 150–440)
RBC: 2.6 MIL/uL — AB (ref 3.80–5.20)
RDW: 18.6 % — ABNORMAL HIGH (ref 11.5–14.5)
WBC: 19.3 10*3/uL — AB (ref 3.6–11.0)

## 2015-11-11 LAB — GLUCOSE, CAPILLARY
GLUCOSE-CAPILLARY: 158 mg/dL — AB (ref 65–99)
GLUCOSE-CAPILLARY: 109 mg/dL — AB (ref 65–99)
GLUCOSE-CAPILLARY: 136 mg/dL — AB (ref 65–99)
GLUCOSE-CAPILLARY: 124 mg/dL — AB (ref 65–99)
Glucose-Capillary: 119 mg/dL — ABNORMAL HIGH (ref 65–99)
Glucose-Capillary: 120 mg/dL — ABNORMAL HIGH (ref 65–99)
Glucose-Capillary: 129 mg/dL — ABNORMAL HIGH (ref 65–99)
Glucose-Capillary: 139 mg/dL — ABNORMAL HIGH (ref 65–99)

## 2015-11-11 LAB — BASIC METABOLIC PANEL
Anion gap: 6 (ref 5–15)
BUN: 61 mg/dL — AB (ref 6–20)
CHLORIDE: 104 mmol/L (ref 101–111)
CO2: 33 mmol/L — ABNORMAL HIGH (ref 22–32)
Calcium: 8.5 mg/dL — ABNORMAL LOW (ref 8.9–10.3)
Creatinine, Ser: 1.49 mg/dL — ABNORMAL HIGH (ref 0.44–1.00)
GFR calc Af Amer: 46 mL/min — ABNORMAL LOW (ref >60)
GFR calc non Af Amer: 40 mL/min — ABNORMAL LOW (ref >60)
Glucose, Bld: 153 mg/dL — ABNORMAL HIGH (ref 65–99)
POTASSIUM: 3.9 mmol/L (ref 3.5–5.1)
SODIUM: 143 mmol/L (ref 135–145)

## 2015-11-11 MED ORDER — AMIODARONE HCL 200 MG PO TABS
400.0000 mg | ORAL_TABLET | Freq: Two times a day (BID) | ORAL | Status: AC
Start: 2015-11-11 — End: 2015-11-17
  Administered 2015-11-11 – 2015-11-17 (×13): 400 mg
  Filled 2015-11-11 (×14): qty 2

## 2015-11-11 MED ORDER — ALBUMIN HUMAN 25 % IV SOLN
12.5000 g | Freq: Once | INTRAVENOUS | Status: AC
  Administered 2015-11-11: 12.5 g via INTRAVENOUS
  Filled 2015-11-11 (×2): qty 50

## 2015-11-11 MED ORDER — MIDODRINE HCL 5 MG PO TABS
10.0000 mg | ORAL_TABLET | Freq: Three times a day (TID) | ORAL | Status: DC
  Administered 2015-11-11 – 2015-12-06 (×77): 10 mg
  Filled 2015-11-11 (×78): qty 2

## 2015-11-11 MED ORDER — LOPERAMIDE HCL 2 MG PO CAPS
2.0000 mg | ORAL_CAPSULE | ORAL | Status: DC | PRN
Start: 2015-11-11 — End: 2015-12-06
  Administered 2015-11-11 – 2015-12-04 (×5): 2 mg via ORAL
  Filled 2015-11-11 (×5): qty 1

## 2015-11-11 NOTE — Progress Notes (Signed)
Occupational Therapy Treatment Patient Details Name: Courtney [name] MRN: [id number] DOB: 1965-11-26 Today's Date: 11/11/2015    History of present illness Pt is a 56 F who has been in medical facilities (hosp, LTAC, rehab) for 2 yrs following gastric bypass surgery with multiple complications. Now with chronic trach, ESRD, profound debilitation, severe sacral pressure ulcer (stage IV). Was seemingly making progress and transferred to rehab facility approx one week prior to this admission. She was sent to [facility] ED 9/23 with AMS and hypotension. Admitted with sepsis related to infected sacral ulcer.  Hospital course complicated by rectal ulcer and GIB (requiring transfusions) and persistent AMS with newly-diagnosed multi-infarct CVA.   Hospital course continues to be complicated with recurrent sepsis requiring patient on/off vent multiple times; currently on vent support.   OT comments  Follow up checking hands for edema in both hands. Today minimal and patient positioned properly. Gentle stretch to R UE, fingers beginning to flex in (not active) stretched fingers and replaced on pillow to encourage extension.  Follow Up Recommendations       Equipment Recommendations       Recommendations for Other Services      Precautions / Restrictions Precautions Precautions: Fall Precaution Comments: stage 4 sacral ulcer, NG tube/NPO, trach, contact isolation, R IJ permcath, L IJ central line, log roll only (as needed for dressing changes/hygiene) and no ROM to R LE/no attempts at sitting Restrictions Weight Bearing Restrictions: Yes RLE Weight Bearing: Non weight bearing Other Position/Activity Restrictions: No ROM R LE       Mobility Bed Mobility               General bed mobility comments: Deferred/contraindicated  Transfers                 General transfer comment: Deferred/contraindicated    Balance                                   ADL                                                 Vision                     Perception     Praxis      Cognition                          Extremity/Trunk Assessment         Exercises   Shoulder Instructions       General Comments      Pertinent Vitals/ Pain         Home Living        Prior Functioning/Environment    Frequency Min 1X/week     Progress Toward Goals  OT Goals(current goals can now be found in the care plan section)       Plan      Co-evaluation                 End of Session     Activity Tolerance     Patient Left     Nurse Communication          Time: 4782-9562 OT Time Calculation (min): 8 min  Charges: OT General Charges $OT Visit: 1 Procedure OT Treatments $Therapeutic Activity: 8-22 mins Ocie CornfieldJohn M Aaradhya Kysar, MS/OTR/L  [name], Jujuan Dugo M 11/11/2015, 5:09 PM

## 2015-11-11 NOTE — Progress Notes (Signed)
Nutrition Follow-up  DOCUMENTATION CODES:   Severe malnutrition in context of acute illness/injury  INTERVENTION:   EN: continue TF as tolerated as per MD orders   NUTRITION DIAGNOSIS:   Inadequate oral intake related to wound healing, acute illness, chronic illness as evidenced by NPO status, estimated needs.  GOAL:   Patient will meet greater than or equal to 90% of their needs  MONITOR:    (Energy Intake, Anthropometrics, Digestive System, Electrolyte/Renal Profile, Glucose Profile)  REASON FOR ASSESSMENT:   Consult Enteral/tube feeding initiation and management  ASSESSMENT:    Pt continues HD as scheduled, noted pt did not tolerate HD well on 12/21 (became tachycardic and hypotensive) and dialysis was cut short;  vent via trach. Pt with new findings of cardiomyopathy post  MI, cardiology consulted for new onset a.fib. Currently on levophed   EN: tolerating Jevity 1.5 TF at rate of 40 ml/hr, goal of 50 ml/hr, Prostat 6 times per day. Pt with episode of vomiting again on 12/21, TF restarted on 12/22 at rate of 30 ml/hr  Skin:   (stage IV sacrum, stage III hip, unstageable foot; rectal ulcer from fleixseal, ulcer in nose from dobhoff)  Last BM:  12/23  Digestive System: +2 BMs during the night, no BM on this shift thus far, tolerating TF today  Electrolyte and Renal Profile:  Recent Labs Lab 11/07/15 1810 11/09/15 1307 11/09/15 1534 11/10/15 0933 11/11/15 0558  BUN 80* 64* 39* 47* 61*  CREATININE 1.73* 1.41* 0.85 1.23* 1.49*  NA 146* 144 143 138 143  K 5.0 4.3 3.4* 3.6 3.9  MG  --   --  1.8  --   --   PHOS 4.7* 3.3 2.4*  --   --    Glucose Profile:  Recent Labs  11/11/15 0040 11/11/15 0408 11/11/15 0718  GLUCAP 120* 158* 139*   Meds: reviewed  Height:   Ht Readings from Last 1 Encounters:  01/25/12 5\' 9"  (1.753 m)    Weight:   Wt Readings from Last 1 Encounters:  11/09/15 177 lb 11.1 oz (80.6 kg)    Filed Weights   11/08/15 0428  11/09/15 0454  Weight: 177 lb 11.1 oz (80.6 kg) 177 lb 11.1 oz (80.6 kg)    BMI:  Body mass index is 26.23 kg/(m^2).  Estimated Nutritional Needs:   Kcal:  TEE 1851 kcals/d (Ve 7.4, Tmax 37.4) Using current wt (97kg) edema noted (re-intubated)  Protein:  (1.5-2.0 g/kg)145-194 g/d but likely closer to 2.0-2.5 g/kg Using current wt of 97kg  Fluid:  1000ml + UOP  EDUCATION NEEDS:   No education needs identified at this time  LOW Care Level  Courtney Starcherate Courtney Roskelley MS, RD, LDN [phone] Pager  [phone] Weekend/On-Call Pager

## 2015-11-11 NOTE — Evaluation (Signed)
Physical Therapy Re-Evaluation Patient Details Name: Courtney [name] MRN: [id number] DOB: [birth year] Today's Date: 11/11/2015   History of Present Illness  Pt is a 68 F who has been in medical facilities (hosp, LTAC, rehab) for 2 yrs following gastric bypass surgery with multiple complications. Now with chronic trach, ESRD, profound debilitation, severe sacral pressure ulcer (stage IV). Was seemingly making progress and transferred to rehab facility approx one week prior to this admission. She was sent to [facility] ED 9/23 with AMS and hypotension. Admitted with sepsis related to infected sacral ulcer.  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  Patient transferred to CCU to med-surg floor during hospitalization, but experienced profound interdialytic hypotension requiring return transfer to CCU for use of pressors (now off); remains in CCU.  Noted with R hip intertrochanteric fracture, recommended for conservative management per orthopedics.  Hospital course continues to be complicated with recurrent sepsis requiring patient on/off vent multiple times; currently on vent support with PEEP 5.  Now noted with anterior wall MI and significantly diminished EF (35%) since admission.  Intermittent ability/willingness to follow commands or actively participate in therapeutic interventions.  Per care team, physicians recommending hospice care/comfort measures at this time; husband considering goals of care/POC pending discussion with patient and other family members, but wishes continued medical care at this time. Will carefully monitor medical plan and POC to evaluate appropriateness of continued PT throughout remaining hospitalization.  Clinical Impression  Upon evaluation, patient alert and more responsive to therapist this date.  Consistently tracks  therapist, nods head yes/no and attempts to mouth words to communicate needs/wants.  Demonstrates some degree of active movement/effort at all joints bilat UEs this date, though significantly weak and deconditioned.  Greater spontaneous, purposeful movement of L > R UE.  L LE grossly flaccid without active contraction visible or palpable; significant pain noted (indicated via facial grimacing, patient mouthing 'ouch') with L hip/knee flexion beyond approx 30 degrees.  R LE remains immobilized due to R hip fracture (new imaging not completed throughout hospital course).  Additional mobility contraindicated at this time until cleared by orthopedics. Will continue to monitor patient's response to and participation with treatment sessions to determine continued POC at this time.    Follow Up Recommendations LTACH    Equipment Recommendations       Recommendations for Other Services       Precautions / Restrictions Precautions Precautions: Fall Precaution Comments: stage 4 sacral ulcer, NG tube/NPO, trach, contact isolation, R IJ permcath, L IJ central line, log roll only (as needed for dressing changes/hygiene) and no ROM to R LE/no attempts at sitting Restrictions Weight Bearing Restrictions: Yes RLE Weight Bearing: Non weight bearing Other Position/Activity Restrictions: No ROM R LE      Mobility  Bed Mobility               General bed mobility comments: Deferred/contraindicated  Transfers                 General transfer comment: Deferred/contraindicated  Ambulation/Gait                Stairs            Wheelchair Mobility    Modified Rankin (Stroke Patients Only)       Balance  Pertinent Vitals/Pain Pain Assessment: Faces Faces Pain Scale: Hurts even more Pain Location: facial grimacing, mouthing "ouch" with ROM to L LE Pain Descriptors / Indicators: Grimacing Pain  Intervention(s): Limited activity within patient's tolerance;Monitored during session;Repositioned;Patient requesting pain meds-RN notified    Home Living Family/patient expects to be discharged to:: Skilled nursing facility                 Additional Comments: patient unable to provide information regarding PLOF or level of participation/function prior to hospitalization    Prior Function Level of Independence: Needs assistance   Gait / Transfers Assistance Needed: pt is bed bound and does not transfer at this time  ADL's / Homemaking Assistance Needed: Dependent for all ADL  Comments: patient unable to provide information regarding PLOF or level of participation/function prior to hospitalization; will verify from family/resources as appropriate     Hand Dominance        Extremity/Trunk Assessment   Upper Extremity Assessment: Generalized weakness (active, purposeful movement of L > R UE, both globally deconditioned (L 3-/5, R 2+/5).  Does show some degree of active effort at alll joints bilat this date)           Lower Extremity Assessment: Generalized weakness (L LE generally flaccid with no attempts at active movement noted.  Significant facial grimacing noted with L hip/knee flexion beyond approx 30 degrees; tolerating ankle DF to neutral.  R LE remains immobilized/not tested due to previous R hip fracture)         Communication   Communication: Tracheostomy (patient now beginning to mouth words/needs at times; more consistently participating with yes/no questions, though still not 100% accurate)  Cognition Arousal/Alertness: Awake/alert Behavior During Therapy: Flat affect Overall Cognitive Status: Difficult to assess (improved alertness and efforts at communication noted this session)                      General Comments      Exercises Other Exercises Other Exercises: Act assist ROM to bilat UEs, 1x10: gross grasp/release, wrist flex/ext, forearm  pronation/supination, elbow flex/ext, shoulder IR/ER.  Patient noted with active effort at all joints bilat UEs this date, though significantly weak and deconditioned; more spontaneous, purposeful movement noted L Ue. Other Exercises: L LE passive ROM, 1x10: ankle PF/DF, hip/knee flex/ext (limited to approx 30 degrees secondary to pain), hip abduct/adduct/IR/ER.  Facial grimacing with movement of L hip/knee.  significant pitting edema noted throughout L LE.      Assessment/Plan    PT Assessment Patient needs continued PT services  PT Diagnosis Generalized weakness;Acute pain   PT Problem List Decreased strength;Decreased range of motion;Decreased activity tolerance;Decreased balance;Decreased mobility;Decreased coordination;Decreased cognition;Decreased knowledge of use of DME;Decreased safety awareness;Decreased knowledge of precautions;Cardiopulmonary status limiting activity;Impaired sensation;Decreased skin integrity;Pain  PT Treatment Interventions Therapeutic exercise;Manual techniques;Patient/family education;Therapeutic activities;Neuromuscular re-education;Cognitive remediation   PT Goals (Current goals can be found in the Care Plan section) Acute Rehab PT Goals Patient Stated Goal: patient unable to verbalize/state goal PT Goal Formulation: Patient unable to participate in goal setting Time For Goal Achievement: 11/25/15 Potential to Achieve Goals: Poor    Frequency Min 2X/week   Barriers to discharge Decreased caregiver support      Co-evaluation               End of Session   Activity Tolerance: Patient limited by pain Patient left: in bed;with family/visitor present Nurse Communication: Patient requests pain meds         Time:  [id number] PT Time Calculation (min) (ACUTE ONLY): 24 min   Charges:   PT Evaluation $PT Re-evaluation: 1 Procedure PT Treatments $Therapeutic Exercise: 8-22 mins   PT G Codes:        Shriyan Arakawa H. Manson PasseyBrown, PT, DPT, NCS 11/11/2015,  4:08 PM [phone]

## 2015-11-11 NOTE — Progress Notes (Signed)
Subjective:   Patient is doing poorly with dialysis today Blood pressure drops to the 70s and 80s but 15-20 minutes Temperature was decreased IV albumin was given to the patient Ultrafiltration was initially cut off   HEMODIALYSIS FLOWSHEET:  Blood Flow Rate (mL/min): 300 mL/min Arterial Pressure (mmHg): -110 mmHg Venous Pressure (mmHg): 140 mmHg Transmembrane Pressure (mmHg): 30 mmHg Ultrafiltration Rate (mL/min): 310 mL/min Dialysate Flow Rate (mL/min): 600 ml/min Conductivity: Machine : 141 Conductivity: Machine : 141 Dialysis Fluid Bolus: Normal Saline Bolus Amount (mL): 250 mL Dialysate Change: 2K (K+ 5.0 & Dr Thedore Mins aware) Intra-Hemodialysis Comments: TX DISCONTINUED D/T LOW BP      Objective:  Vital signs in last 24 hours:  Temp:  [98.3 F (36.8 C)-99.8 F (37.7 C)] 98.9 F (37.2 C) (12/23 0800) Pulse Rate:  [66-81] 71 (12/23 1000) Resp:  [0-26] 26 (12/23 1000) BP: (93-143)/(41-85) 99/47 mmHg (12/23 1152) SpO2:  [94 %-100 %] 99 % (12/23 1000) FiO2 (%):  [40 %] 40 % (12/23 1028)  Weight change:  Filed Weights   11/08/15 0428 11/09/15 0454  Weight: 80.6 kg (177 lb 11.1 oz) 80.6 kg (177 lb 11.1 oz)    Intake/Output: I/O last 3 completed shifts: In: 3157.5 [I.V.:2137.5; NG/GT:970; IV Piggyback:50] Out: -    Intake/Output this shift:  Total I/O In: 332.1 [I.V.:102.1; NG/GT:180; IV Piggyback:50] Out: 64 [Other:64]  Physical Exam: General: critically ill appearing  Head/ENT: OM moist, NG tube  Eyes: Anicteric, eyes open  Neck: Tracheostomy in place  Lungs:  Scattered rhonchi, vent assisted  PRVC fio2 40%  Heart: S1S2, + pericardial rub  Abdomen:  Soft, nontender, BS present  Extremities: 3+ dependent edema, anasarca  Neurologic:  able to follow simple commands  Access:  R IJ permcath.       Basic Metabolic Panel:  Recent Labs Lab 11/07/15 1810 11/09/15 1307 11/09/15 1534 11/10/15 0933 11/11/15 0558  NA 146* 144 143 138 143  K 5.0 4.3  3.4* 3.6 3.9  CL 104 105 100* 96* 104  CO2 34* 33* 33* 31 33*  GLUCOSE 116* 98 92 279* 153*  BUN 80* 64* 39* 47* 61*  CREATININE 1.73* 1.41* 0.85 1.23* 1.49*  CALCIUM 8.7* 8.5* 8.4* 8.1* 8.5*  MG  --   --  1.8  --   --   PHOS 4.7* 3.3 2.4*  --   --     Liver Function Tests:  Recent Labs Lab 11/07/15 1810 11/09/15 1307 11/09/15 1534  AST  --   --  68*  ALT  --   --  43  ALKPHOS  --   --  1677*  BILITOT  --   --  1.2  PROT  --   --  6.1*  ALBUMIN 1.9* 1.8* 2.0*   No results for input(s): LIPASE, AMYLASE in the last 168 hours. No results for input(s): AMMONIA in the last 168 hours.  CBC:  Recent Labs Lab 11/06/15 0819 11/07/15 1750 11/09/15 1307 11/10/15 0930 11/11/15 0558  WBC 15.7* 14.7* 17.7* 23.1* 19.3*  NEUTROABS  --   --   --   --  16.7*  HGB 6.3* 7.9* 7.6* 7.9* 7.6*  HCT 21.2* 26.0* 25.9* 26.5* 25.1*  MCV 98.2 95.9 97.0 95.4 96.6  PLT 208 209 216 230 217    Cardiac Enzymes:  Recent Labs Lab 11/09/15 1534 11/10/15 0125 11/10/15 0930  CKTOTAL 5* 5*  --   TROPONINI 0.11* 0.08* 0.28*    BNP: Invalid input(s): POCBNP  CBG:  Recent  Labs Lab 11/10/15 2027 11/11/15 0040 11/11/15 0408 11/11/15 0718 11/11/15 1203  GLUCAP 100* 120* 158* 139* 109*    Microbiology: Results for orders placed or performed during the hospital encounter of August 23, 2015  Blood Culture (routine x 2)     Status: None   Collection Time: Aug 23, 2015  8:51 AM  Result Value Ref Range Status   Specimen Description BLOOD [name]  Final   Special Requests BOTTLES DRAWN AEROBIC AND ANAEROBIC  3CC  Final   Culture NO GROWTH 5 DAYS  Final   Report Status 08/17/2015 FINAL  Final  Blood Culture (routine x 2)     Status: None   Collection Time: 08/23/15  9:20 AM  Result Value Ref Range Status   Specimen Description BLOOD LEFT ARM  Final   Special Requests BOTTLES DRAWN AEROBIC AND ANAEROBIC  1CC  Final   Culture NO GROWTH 5 DAYS  Final   Report Status 08/17/2015 FINAL  Final  Wound  culture     Status: None   Collection Time: August 23, 2015  9:20 AM  Result Value Ref Range Status   Specimen Description DECUBITIS  Final   Special Requests Normal  Final   Gram Stain   Final    FEW WBC SEEN MANY GRAM NEGATIVE RODS RARE GRAM POSITIVE COCCI    Culture   Final    HEAVY GROWTH ESCHERICHIA COLI MODERATE GROWTH ENTEROBACTER AEROGENES PROTEUS MIRABILIS HEAVY GROWTH ENTEROCOCCUS SPECIES VRE HAVE INTRINSIC RESISTANCE TO MOST COMMONLY USED ANTIBIOTICS AND THE ABILITY TO ACQUIRE RESISTANCE TO MOST AVAILABLE ANTIBIOTICS.    Report Status 08/16/2015 FINAL  Final   Organism ID, Bacteria ESCHERICHIA COLI  Final   Organism ID, Bacteria ENTEROBACTER AEROGENES  Final   Organism ID, Bacteria PROTEUS MIRABILIS  Final   Organism ID, Bacteria ENTEROCOCCUS SPECIES  Final      Susceptibility   Enterobacter aerogenes - MIC*    CEFTAZIDIME <=1 SENSITIVE Sensitive     CEFAZOLIN >=64 RESISTANT Resistant     CEFTRIAXONE <=1 SENSITIVE Sensitive     CIPROFLOXACIN <=0.25 SENSITIVE Sensitive     GENTAMICIN <=1 SENSITIVE Sensitive     IMIPENEM 1 SENSITIVE Sensitive     TRIMETH/SULFA <=20 SENSITIVE Sensitive     * MODERATE GROWTH ENTEROBACTER AEROGENES   Escherichia coli - MIC*    AMPICILLIN <=2 SENSITIVE Sensitive     CEFTAZIDIME <=1 SENSITIVE Sensitive     CEFAZOLIN <=4 SENSITIVE Sensitive     CEFTRIAXONE <=1 SENSITIVE Sensitive     CIPROFLOXACIN <=0.25 SENSITIVE Sensitive     GENTAMICIN <=1 SENSITIVE Sensitive     IMIPENEM <=0.25 SENSITIVE Sensitive     TRIMETH/SULFA <=20 SENSITIVE Sensitive     * HEAVY GROWTH ESCHERICHIA COLI   Proteus mirabilis - MIC*    AMPICILLIN >=32 RESISTANT Resistant     CEFTAZIDIME <=1 SENSITIVE Sensitive     CEFAZOLIN 8 SENSITIVE Sensitive     CEFTRIAXONE <=1 SENSITIVE Sensitive     CIPROFLOXACIN <=0.25 SENSITIVE Sensitive     GENTAMICIN <=1 SENSITIVE Sensitive     IMIPENEM 1 SENSITIVE Sensitive     TRIMETH/SULFA <=20 SENSITIVE Sensitive     * PROTEUS  MIRABILIS   Enterococcus species - MIC*    AMPICILLIN >=32 RESISTANT Resistant     VANCOMYCIN >=32 RESISTANT Resistant     GENTAMICIN SYNERGY SENSITIVE Sensitive     TETRACYCLINE Value in next row Resistant      RESISTANT>=16    * HEAVY GROWTH ENTEROCOCCUS SPECIES  MRSA PCR Screening  Status: None   Collection Time: 08/10/2015  2:38 PM  Result Value Ref Range Status   MRSA by PCR NEGATIVE NEGATIVE Final    Comment:        The GeneXpert MRSA Assay (FDA approved for NASAL specimens only), is one component of a comprehensive MRSA colonization surveillance program. It is not intended to diagnose MRSA infection nor to guide or monitor treatment for MRSA infections.   Blood culture (single)     Status: None   Collection Time: 07/29/2015  3:36 PM  Result Value Ref Range Status   Specimen Description BLOOD RIGHT ASSIST CONTROL  Final   Special Requests BOTTLES DRAWN AEROBIC AND ANAEROBIC  Final   Culture NO GROWTH 5 DAYS  Final   Report Status 08/17/2015 FINAL  Final  Wound culture     Status: None   Collection Time: 08/13/15 12:37 PM  Result Value Ref Range Status   Specimen Description WOUND  Final   Special Requests Normal  Final   Gram Stain   Final    FEW WBC SEEN TOO NUMEROUS TO COUNT GRAM NEGATIVE RODS FEW GRAM POSITIVE COCCI    Culture   Final    HEAVY GROWTH ESCHERICHIA COLI MODERATE GROWTH PROTEUS MIRABILIS LIGHT GROWTH KLEBSIELLA PNEUMONIAE MODERATE GROWTH ENTEROCOCCUS GALLINARUM CRITICAL RESULT CALLED TO, READ BACK BY AND VERIFIED WITH: [facility] BORBA AT 1042 08/16/15 DV    Report Status 08/17/2015 FINAL  Final   Organism ID, Bacteria ESCHERICHIA COLI  Final   Organism ID, Bacteria PROTEUS MIRABILIS  Final   Organism ID, Bacteria KLEBSIELLA PNEUMONIAE  Final   Organism ID, Bacteria ENTEROCOCCUS GALLINARUM  Final      Susceptibility   Escherichia coli - MIC*    AMPICILLIN >=32 RESISTANT Resistant     CEFTAZIDIME 4 RESISTANT Resistant     CEFAZOLIN >=64  RESISTANT Resistant     CEFTRIAXONE 16 RESISTANT Resistant     GENTAMICIN 2 SENSITIVE Sensitive     IMIPENEM >=16 RESISTANT Resistant     TRIMETH/SULFA <=20 SENSITIVE Sensitive     Extended ESBL POSITIVE Resistant     PIP/TAZO Value in next row Resistant      RESISTANT>=128    CIPROFLOXACIN Value in next row Sensitive      SENSITIVE<=0.25    * HEAVY GROWTH ESCHERICHIA COLI   Klebsiella pneumoniae - MIC*    AMPICILLIN Value in next row Resistant      SENSITIVE<=0.25    CEFTAZIDIME Value in next row Resistant      SENSITIVE<=0.25    CEFAZOLIN Value in next row Resistant      SENSITIVE<=0.25    CEFTRIAXONE Value in next row Resistant      SENSITIVE<=0.25    CIPROFLOXACIN Value in next row Resistant      SENSITIVE<=0.25    GENTAMICIN Value in next row Sensitive      SENSITIVE<=0.25    IMIPENEM Value in next row Resistant      SENSITIVE<=0.25    TRIMETH/SULFA Value in next row Resistant      SENSITIVE<=0.25    PIP/TAZO Value in next row Resistant      RESISTANT>=128    * LIGHT GROWTH KLEBSIELLA PNEUMONIAE   Proteus mirabilis - MIC*    AMPICILLIN Value in next row Resistant      RESISTANT>=128    CEFTAZIDIME Value in next row Sensitive      RESISTANT>=128    CEFAZOLIN Value in next row Sensitive      RESISTANT>=128  CEFTRIAXONE Value in next row Sensitive      RESISTANT>=128    CIPROFLOXACIN Value in next row Sensitive      RESISTANT>=128    GENTAMICIN Value in next row Sensitive      RESISTANT>=128    IMIPENEM Value in next row Sensitive      RESISTANT>=128    TRIMETH/SULFA Value in next row Sensitive      RESISTANT>=128    PIP/TAZO Value in next row Sensitive      SENSITIVE<=4    * MODERATE GROWTH PROTEUS MIRABILIS   Enterococcus gallinarum - MIC*    AMPICILLIN Value in next row Resistant      SENSITIVE<=4    GENTAMICIN SYNERGY Value in next row Sensitive      SENSITIVE<=4    CIPROFLOXACIN Value in next row Resistant      RESISTANT>=8    TETRACYCLINE Value in  next row Resistant      RESISTANT>=16    * MODERATE GROWTH ENTEROCOCCUS GALLINARUM  Wound culture     Status: None   Collection Time: 08/15/15  2:53 PM  Result Value Ref Range Status   Specimen Description WOUND  Final   Special Requests NONE  Final   Gram Stain FEW WBC SEEN NO ORGANISMS SEEN   Final   Culture NO GROWTH 3 DAYS  Final   Report Status 08/18/2015 FINAL  Final  C difficile quick scan w PCR reflex     Status: None   Collection Time: 08/17/15 11:34 AM  Result Value Ref Range Status   C Diff antigen NEGATIVE NEGATIVE Final   C Diff toxin NEGATIVE NEGATIVE Final   C Diff interpretation Negative for C. difficile  Final  Stool culture     Status: None   Collection Time: 09/03/15  3:51 PM  Result Value Ref Range Status   Specimen Description STOOL  Final   Special Requests Immunocompromised  Final   Culture   Final    NO SALMONELLA OR SHIGELLA ISOLATED No Pathogenic E. coli detected NO CAMPYLOBACTER DETECTED    Report Status 09/07/2015 FINAL  Final  C difficile quick scan w PCR reflex     Status: None   Collection Time: 09/13/15 12:51 AM  Result Value Ref Range Status   C Diff antigen NEGATIVE NEGATIVE Final   C Diff toxin NEGATIVE NEGATIVE Final   C Diff interpretation Negative for C. difficile  Final  Culture, blood (routine x 2)     Status: None   Collection Time: 09/16/15 11:24 AM  Result Value Ref Range Status   Specimen Description BLOOD LEFT HAND  Final   Special Requests BOTTLES DRAWN AEROBIC AND ANAEROBIC  1CC  Final   Culture NO GROWTH 5 DAYS  Final   Report Status 09/21/2015 FINAL  Final  Culture, blood (routine x 2)     Status: None   Collection Time: 09/16/15 12:23 PM  Result Value Ref Range Status   Specimen Description BLOOD RIGHT HAND  Final   Special Requests BOTTLES DRAWN AEROBIC AND ANAEROBIC  1CC  Final   Culture  Setup Time   Final    GRAM POSITIVE COCCI AEROBIC BOTTLE ONLY CRITICAL RESULT CALLED TO, READ BACK BY AND VERIFIED WITH: TESS  THOMAS,RN 09/17/2015 0631 BY JRS.    Culture   Final    ENTEROCOCCUS FAECALIS AEROBIC BOTTLE ONLY VRE HAVE INTRINSIC RESISTANCE TO MOST COMMONLY USED ANTIBIOTICS AND THE ABILITY TO ACQUIRE RESISTANCE TO MOST AVAILABLE ANTIBIOTICS. CRITICAL RESULT CALLED TO, READ BACK  BY AND VERIFIED WITH: Verlon AuCHERYL SMITH AT [id number] 09/19/15 DV    Report Status 09/21/2015 FINAL  Final   Organism ID, Bacteria ENTEROCOCCUS FAECALIS  Final      Susceptibility   Enterococcus faecalis - MIC*    AMPICILLIN <=2 SENSITIVE Sensitive     LINEZOLID 2 SENSITIVE Sensitive     CIPROFLOXACIN Value in next row Resistant      RESISTANT>=8    TETRACYCLINE Value in next row Resistant      RESISTANT>=16    VANCOMYCIN Value in next row Resistant      RESISTANT>=32    GENTAMICIN SYNERGY Value in next row Resistant      RESISTANT>=32    * ENTEROCOCCUS FAECALIS  Culture, respiratory (NON-Expectorated)     Status: None   Collection Time: 09/16/15  3:50 PM  Result Value Ref Range Status   Specimen Description TRACHEAL ASPIRATE  Final   Special Requests Immunocompromised  Final   Gram Stain   Final    FEW WBC SEEN GOOD SPECIMEN - 80-90% WBCS RARE GRAM NEGATIVE RODS    Culture LIGHT GROWTH PSEUDOMONAS AERUGINOSA  Final   Report Status 09/19/2015 FINAL  Final   Organism ID, Bacteria PSEUDOMONAS AERUGINOSA  Final      Susceptibility   Pseudomonas aeruginosa - MIC*    CEFTAZIDIME 8 SENSITIVE Sensitive     CIPROFLOXACIN 2 INTERMEDIATE Intermediate     GENTAMICIN >=16 RESISTANT Resistant     IMIPENEM >=16 RESISTANT Resistant     PIP/TAZO Value in next row Sensitive      SENSITIVE32    CEFEPIME Value in next row Sensitive      SENSITIVE8    LEVOFLOXACIN Value in next row Resistant      RESISTANT>=8    * LIGHT GROWTH PSEUDOMONAS AERUGINOSA  Culture, expectorated sputum-assessment     Status: None   Collection Time: 09/22/15  2:09 PM  Result Value Ref Range Status   Specimen Description ENDOTRACHEAL  Final   Special  Requests Normal  Final   Sputum evaluation THIS SPECIMEN IS ACCEPTABLE FOR SPUTUM CULTURE  Final   Report Status 09/24/2015 FINAL  Final  Culture, respiratory (NON-Expectorated)     Status: None   Collection Time: 09/22/15  2:09 PM  Result Value Ref Range Status   Specimen Description ENDOTRACHEAL  Final   Special Requests Normal Reflexed from [id number]  Final   Gram Stain   Final    FEW WBC SEEN FEW GRAM NEGATIVE RODS POOR SPECIMEN - LESS THAN 70% WBCS    Culture   Final    MODERATE GROWTH PSEUDOMONAS AERUGINOSA LIGHT GROWTH KLEBSIELLA PNEUMONIAE REFER TO SENSITIVITIES FROM PREVIOUS CULTURE FOR ORG 2    Report Status 10/01/2015 FINAL  Final   Organism ID, Bacteria PSEUDOMONAS AERUGINOSA  Final   Organism ID, Bacteria KLEBSIELLA PNEUMONIAE  Final      Susceptibility   Pseudomonas aeruginosa - MIC*    CEFTAZIDIME 4 SENSITIVE Sensitive     CIPROFLOXACIN 2 INTERMEDIATE Intermediate     GENTAMICIN >=16 RESISTANT Resistant     IMIPENEM >=16 RESISTANT Resistant     PIP/TAZO Value in next row Sensitive      SENSITIVE16    * MODERATE GROWTH PSEUDOMONAS AERUGINOSA  Tissue culture     Status: None   Collection Time: 09/24/15  6:44 AM  Result Value Ref Range Status   Specimen Description BONE  Final   Special Requests Normal  Final   Gram Stain MODERATE WBC SEEN FEW GRAM  NEGATIVE RODS   Final   Culture   Final    MODERATE GROWTH ESCHERICHIA COLI MODERATE GROWTH KLEBSIELLA PNEUMONIAE ESBL-EXTENDED SPECTRUM BETA LACTAMASE-THE ORGANISM IS RESISTANT TO PENICILLINS, CEPHALOSPORINS AND AZTREONAM ACCORDING TO CLSI M100-S15 VOL.25 N01 JAN 2005. ORGANISM 1 This organism isolate is resistant to one or more antiotic agents in three or more antimicrobial categories.  Suggest Infectious Disease consult.   ORGANISM 2 CRITICAL RESULT CALLED TO, READ BACK BY AND VERIFIED WITH: RN Mardene Celeste LINDSAY 09/27/15 1005AM    Report Status 09/28/2015 FINAL  Final   Organism ID, Bacteria ESCHERICHIA COLI  Final    Organism ID, Bacteria KLEBSIELLA PNEUMONIAE  Final      Susceptibility   Escherichia coli - MIC*    AMPICILLIN >=32 RESISTANT Resistant     CEFTAZIDIME 4 RESISTANT Resistant     CEFAZOLIN >=64 RESISTANT Resistant     CEFTRIAXONE 8 RESISTANT Resistant     GENTAMICIN <=1 SENSITIVE Sensitive     IMIPENEM 8 RESISTANT Resistant     TRIMETH/SULFA <=20 SENSITIVE Sensitive     Extended ESBL POSITIVE Resistant     PIP/TAZO Value in next row Resistant      RESISTANT>=128    * MODERATE GROWTH ESCHERICHIA COLI   Klebsiella pneumoniae - MIC*    AMPICILLIN Value in next row Resistant      RESISTANT>=128    CEFTAZIDIME Value in next row Resistant      RESISTANT>=128    CEFAZOLIN Value in next row Resistant      RESISTANT>=128    CEFTRIAXONE Value in next row Resistant      RESISTANT>=128    CIPROFLOXACIN Value in next row Resistant      RESISTANT>=128    GENTAMICIN Value in next row Resistant      RESISTANT>=128    IMIPENEM Value in next row Resistant      RESISTANT>=128    TRIMETH/SULFA Value in next row Sensitive      RESISTANT>=128    PIP/TAZO Value in next row Resistant      RESISTANT>=128    * MODERATE GROWTH KLEBSIELLA PNEUMONIAE  Anaerobic culture     Status: None   Collection Time: 09/24/15  3:55 PM  Result Value Ref Range Status   Specimen Description BONE  Final   Special Requests Normal  Final   Culture NO ANAEROBES ISOLATED  Final   Report Status 09/29/2015 FINAL  Final  Culture, fungus without smear     Status: None   Collection Time: 09/24/15  3:55 PM  Result Value Ref Range Status   Specimen Description BONE  Final   Special Requests Normal  Final   Culture NO FUNGUS ISOLATED AFTER 24 DAYS  Final   Report Status 10/18/2015 FINAL  Final  C difficile quick scan w PCR reflex     Status: None   Collection Time: 10/07/15  2:02 PM  Result Value Ref Range Status   C Diff antigen NEGATIVE NEGATIVE Final   C Diff toxin NEGATIVE NEGATIVE Final   C Diff interpretation  Negative for C. difficile  Final  Culture, blood (routine x 2)     Status: None   Collection Time: 10/16/15  9:52 AM  Result Value Ref Range Status   Specimen Description BLOOD LEFT HAND  Final   Special Requests   Final    BOTTLES DRAWN AEROBIC AND ANAEROBIC  AER 6CC ANA 4CC   Culture NO GROWTH 6 DAYS  Final   Report Status 10/22/2015 FINAL  Final  Culture, blood (routine x 2)     Status: None   Collection Time: 10/16/15 12:25 PM  Result Value Ref Range Status   Specimen Description BLOOD LEFT HAND  Final   Special Requests BOTTLES DRAWN AEROBIC AND ANAEROBIC  5CC  Final   Culture NO GROWTH 6 DAYS  Final   Report Status 10/22/2015 FINAL  Final  Culture, expectorated sputum-assessment     Status: None   Collection Time: 10/18/15  1:15 PM  Result Value Ref Range Status   Specimen Description SPUTUM  Final   Special Requests NONE  Final   Sputum evaluation THIS SPECIMEN IS ACCEPTABLE FOR SPUTUM CULTURE  Final   Report Status 10/18/2015 FINAL  Final  Culture, respiratory (NON-Expectorated)     Status: None   Collection Time: 10/18/15  1:15 PM  Result Value Ref Range Status   Specimen Description SPUTUM  Final   Special Requests NONE Reflexed from [id number]  Final   Gram Stain   Final    GOOD SPECIMEN - 80-90% WBCS MODERATE WBC SEEN MANY GRAM NEGATIVE RODS    Culture   Final    HEAVY GROWTH PSEUDOMONAS AERUGINOSA MODERATE GROWTH KLEBSIELLA PNEUMONIAE CRITICAL RESULT CALLED TO, READ BACK BY AND VERIFIED WITHRod Mae AT 1610 10/21/15 DV KLEBSIELLA PNEUMONIAE This organism isolate is resistant to one or more antiotic agents in three or more antimicrobial categories.  Suggest Infectious Disease  consult.      Report Status 10/22/2015 FINAL  Final   Organism ID, Bacteria PSEUDOMONAS AERUGINOSA  Final   Organism ID, Bacteria KLEBSIELLA PNEUMONIAE  Final      Susceptibility   Klebsiella pneumoniae - MIC*    AMPICILLIN >=32 RESISTANT Resistant     CEFAZOLIN >=64 RESISTANT  Resistant     CEFTRIAXONE >=64 RESISTANT Resistant     CIPROFLOXACIN >=4 RESISTANT Resistant     GENTAMICIN >=16 RESISTANT Resistant     IMIPENEM >=16 RESISTANT Resistant     NITROFURANTOIN >=512 RESISTANT Resistant     TRIMETH/SULFA <=20 SENSITIVE Sensitive     * MODERATE GROWTH KLEBSIELLA PNEUMONIAE   Pseudomonas aeruginosa - MIC*    CEFTAZIDIME 4 SENSITIVE Sensitive     CIPROFLOXACIN >=4 RESISTANT Resistant     GENTAMICIN 8 INTERMEDIATE Intermediate     IMIPENEM >=16 RESISTANT Resistant     PIP/TAZO Value in next row Sensitive      SENSITIVE32    * HEAVY GROWTH PSEUDOMONAS AERUGINOSA  C difficile quick scan w PCR reflex     Status: None   Collection Time: 10/18/15  4:39 PM  Result Value Ref Range Status   C Diff antigen NEGATIVE NEGATIVE Final   C Diff toxin NEGATIVE NEGATIVE Final   C Diff interpretation Negative for C. difficile  Final  Culture, blood (Routine X 2) w Reflex to ID Panel     Status: None   Collection Time: 11/06/15  8:27 AM  Result Value Ref Range Status   Specimen Description BLOOD RIGHT HAND  Final   Special Requests BOTTLES DRAWN AEROBIC AND ANAEROBIC 3CC  Final   Culture NO GROWTH 5 DAYS  Final   Report Status 11/11/2015 FINAL  Final  Culture, blood (Routine X 2) w Reflex to ID Panel     Status: None   Collection Time: 11/06/15  8:29 AM  Result Value Ref Range Status   Specimen Description BLOOD LEFT HAND  Final   Special Requests BOTTLES DRAWN AEROBIC AND ANAEROBIC  1CC  Final   Culture NO  GROWTH 5 DAYS  Final   Report Status 11/11/2015 FINAL  Final  Culture, expectorated sputum-assessment     Status: None   Collection Time: 11/06/15  8:41 AM  Result Value Ref Range Status   Specimen Description EXPECTORATED SPUTUM  Final   Special Requests Immunocompromised  Final   Sputum evaluation THIS SPECIMEN IS ACCEPTABLE FOR SPUTUM CULTURE  Final   Report Status 11/06/2015 FINAL  Final  Culture, respiratory (NON-Expectorated)     Status: None (Preliminary  result)   Collection Time: 11/06/15  8:41 AM  Result Value Ref Range Status   Specimen Description EXPECTORATED SPUTUM  Final   Special Requests Immunocompromised Reflexed from [id number]  Final   Gram Stain   Final    MANY WBC SEEN MANY GRAM NEGATIVE RODS EXCELLENT SPECIMEN - 90-100% WBCS    Culture   Final    MODERATE GROWTH KLEBSIELLA PNEUMONIAE MODERATE GROWTH PSEUDOMONAS AERUGINOSA CRITICAL RESULT CALLED TO, READ BACK BY AND VERIFIED WITH: DR. FITZGERALD AT 1120 11/09/15 DV DR. FITZGERALD REQUESTED ORGANISM BE SENT OUT FOR TIGECYCLINE AND COLISTIN Sent to [facility] for further susceptibility testing. 11/11/15 CTJ    Report Status PENDING  Incomplete   Organism ID, Bacteria KLEBSIELLA PNEUMONIAE  Final   Organism ID, Bacteria PSEUDOMONAS AERUGINOSA  Final      Susceptibility   Klebsiella pneumoniae - MIC*    AMPICILLIN >=32 RESISTANT Resistant     CEFAZOLIN >=64 RESISTANT Resistant     CEFTRIAXONE 32 RESISTANT Resistant     CIPROFLOXACIN >=4 RESISTANT Resistant     GENTAMICIN >=16 RESISTANT Resistant     IMIPENEM >=16 RESISTANT Resistant     NITROFURANTOIN 256 RESISTANT Resistant     TRIMETH/SULFA >=320 RESISTANT Resistant     Extended ESBL POSITIVE Resistant     * MODERATE GROWTH KLEBSIELLA PNEUMONIAE   Pseudomonas aeruginosa - MIC*    CEFTAZIDIME >=64 RESISTANT Resistant     CIPROFLOXACIN 2 INTERMEDIATE Intermediate     GENTAMICIN >=16 RESISTANT Resistant     IMIPENEM >=16 RESISTANT Resistant     * MODERATE GROWTH PSEUDOMONAS AERUGINOSA    Coagulation Studies: No results for input(s): LABPROT, INR in the last 72 hours.  Urinalysis: No results for input(s): COLORURINE, LABSPEC, PHURINE, GLUCOSEU, HGBUR, BILIRUBINUR, KETONESUR, PROTEINUR, UROBILINOGEN, NITRITE, LEUKOCYTESUR in the last 72 hours.  Invalid input(s): APPERANCEUR    Imaging: Dg Chest Port 1 View  11/09/2015  CLINICAL DATA:  Respiratory failure EXAM: PORTABLE CHEST - 1 VIEW COMPARISON:  11/06/2015  FINDINGS: Cardiac shadow remains enlarged. Patchy infiltrates are noted bilaterally and stable. A left jugular PICC line, dialysis catheter, tracheostomy tube and feeding catheter are again seen. Mild deformity of the dialysis catheter is noted at its turned in the neck. Correlation with any limitations of flow is recommended. No bony abnormality is seen. IMPRESSION: Patchy infiltrates bilaterally. The overall appearance is stable from the prior study. Electronically Signed   By: Alcide Clever M.D.   On: 11/09/2015 16:06     Medications:   . dextrose 5 % and 0.9% NaCl 10 mL/hr at 11/11/15 0959  . feeding supplement (JEVITY 1.5 CAL/FIBER) 1,000 mL (11/11/15 1406)  . norepinephrine 0 mcg/min (11/11/15 1407)   . amiodarone  400 mg Per Tube BID  . antiseptic oral rinse  7 mL Mouth Rinse QID  . aspirin  81 mg Oral Daily  . cefTAZidime (FORTAZ)  IV  1 g Intravenous Q24H  . chlorhexidine gluconate  15 mL Mouth Rinse BID  . epoetin (EPOGEN/PROCRIT) injection  10,000 Units Intravenous Q M,W,F-HD  . escitalopram  10 mg Oral Daily  . feeding supplement (PRO-STAT SUGAR FREE 64)  30 mL Oral 6 X Daily  . free water  20 mL Per Tube 6 times per day  . heparin subcutaneous  5,000 Units Subcutaneous Q12H  . hydrocortisone  10 mg Per Tube BID  . lidocaine  1 patch Transdermal Q24H  . midodrine  10 mg Per Tube TID WC  . mirtazapine  7.5 mg Oral QHS  . multivitamin  5 mL Oral Daily  . pantoprazole sodium  40 mg Per Tube Daily  . saccharomyces boulardii  250 mg Oral BID  . sodium chloride  10-40 mL Intracatheter Q12H  . sodium hypochlorite   Irrigation BID   acetaminophen (TYLENOL) oral liquid 160 mg/5 mL, HYDROmorphone (DILAUDID) injection, ipratropium-albuterol, loperamide, morphine injection, ondansetron (ZOFRAN) IV, oxyCODONE, polyvinyl alcohol, sodium chloride, zinc oxide  Assessment/ Plan:  49 y.o. 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 Admitted on 2015/09/07  Echo: 11/07/19: moderately to severely reduced. The estimated ejection fraction was in the range of 30% to 35%. Regional wall motion abnormalities: Severe hypokinesis of the anterior and anteroseptal myocardium.  1. End-stage renal disease on hemodialysis on HD MWF. The patient has been on dialysis since October of 2014. R IJ permcath. Dialysis treatment with IV albumin for oncotic support. Midodrine and hydrocortisone ordered for blood pressure support.  - Continue MWF schedule.     - Did not do well with dialysis on Wednesday - Did not do well with dialysis even today. Blood pressure decreased to the 70s and 80s. IV albumin was given and temperature decreased - No fluid was able to be removed  2. Anemia of CKD. Hemoglobin 7.6 - Continue scheduled Epogen with dialysis.  -  1 unit PRBC  Given 12/18  3. Acute resp failure:  -Patient remains on the ventilator at this point in time.   4. Sepsis: VRE in blood. Bone cultures E. Coli and klebsiella  - Treatment as per ID and ICU team:     5. Generalized edema/anasarca Patient is amenable to tolerate fluid removal with dialysis      LOS: 91 Kaye Mitro 12/23/20162:09 PM

## 2015-11-11 NOTE — Progress Notes (Signed)
MEDICATION RELATED CONSULT NOTE - Follow up   Pharmacy Consult for Renal dosing  Allergies  Allergen Reactions  . Contrast Media [Iodinated Diagnostic Agents] Anaphylaxis  . Ampicillin Rash    Patient Measurements: Ht 625ft 9in Height:  (SCALE BROKE) Weight:  (No bedscale) IBW/kg (Calculated) : 66.2  Vital Signs: Temp: 98.9 F (37.2 C) (12/23 0800) Temp Source: Axillary (12/23 0800) BP: 99/47 mmHg (12/23 1152) Pulse Rate: 71 (12/23 1000)  Recent Labs  11/09/15 1307 11/09/15 1534 11/10/15 0930 11/10/15 0933 11/11/15 0558  WBC 17.7*  --  23.1*  --  19.3*  HGB 7.6*  --  7.9*  --  7.6*  HCT 25.9*  --  26.5*  --  25.1*  PLT 216  --  230  --  217  CREATININE 1.41* 0.85  --  1.23* 1.49*  MG  --  1.8  --   --   --   PHOS 3.3 2.4*  --   --   --   ALBUMIN 1.8* 2.0*  --   --   --   PROT  --  6.1*  --   --   --   AST  --  68*  --   --   --   ALT  --  43  --   --   --   ALKPHOS  --  1677*  --   --   --   BILITOT  --  1.2  --   --   --    Estimated Creatinine Clearance: 51.3 mL/min (by C-G formula based on Cr of 1.49).   Assessment: 49 yo patient on HD. Pharmacy consulted for renal dosing of medications.   Plan:  No medications require adjustment at present. Pharmacy will continue to monitor and dose adjust as needed.    Luisa HartScott Liat Mayol, PharmD  Clinical Pharmacist 11/11/2015

## 2015-11-11 NOTE — Care Management (Signed)
Patient is awake and alert when CM visited.  Husband and son at the bedside decorating Christmas tree.  Patient is smiling and nodding yes.  WBC is 19.3.  Afebrile.

## 2015-11-11 NOTE — Progress Notes (Signed)
PULMONARY / CRITICAL CARE MEDICINE   Name: Courtney [name] MRN: [id number] DOB: 04-13-1966    ADMISSION DATE:  09/02/15  BRIEF HISTORY: 49 AAF who has been in medical facilities (hosp, LTAC, rehab) for 2 yrs following gastric bypass surgery with multiple complications. Now with chronic trach, ESRD, profound debilitation, severe sacral pressure ulcer. Was seemingly making progress and transferred to rehab facility approx one week prior to this admission. She was sent to [facility] ED with AMS and hypotension. Working dx of severe sepsis/septic shock due to infected sacral pressure ulcer. Since admission. Her course is been very complicated with numerous complications including septic shock and GI bleeding. Now with failure to wean from vent and possible MI event, repeat ECHO EF 30-35, possible old MI, severe hypokinesis of the nnterior and anteroseptal myocardium   INTERVAL HISTORY: No new issues. Remains on low dose NE. No further tachyarrhythmias reported  Summary of MAJOR EVENTS/TEST RESULTS: Admission 02/07/14-05/07/14 Admission 07/21/14-09/06/14 Discharged to [facility]. Pt had palliative consult at that time, were asked to sign off by husband.  09/23 CT head: NAD 09/23 EEG: no epileptiform activity 09/23 PRBCs for Hgb 6.4 09/24 bedside debridement of sacral wound. Abscess drained 09/25 Off vasopressors. More alert. No distress. Worsening thrombocytopenia. Vanc DC'd 09/29 Dr. Sampson Goon (I.D) excused from the case by patient's husband. 10/02 MRI -multiple infarcts 10/03 tracheal bleeding- transfused platelets 10/03 hospitalist service excused from the case by patient's husband 10/03 Echocardiogram ejection fraction was 55-60%, pulmonary systolic pressure was 39 mmHg 96/29 restart TF's at lower rate, attempt reg HD  10/12 Transferred to med-surg floor. Remains on PCCM service 10/14 SLP eval: pt unable to tolerate PMV adequately 10/17-will re-attempt PM valve-discussed with Speech  therapist 10/18 passed swallow eval-start pureed thick foods no thin liquids-continue NG feeds 10/19 transferred to step down for sepsis/aspiration pneumonia 10/19 cxr shows RLL opacity 10/21 sacral decub debride at bedside by surgery 10/22 started back on vasopressors while on HD 10/27 CT with osteo, R hip fx, unable to identify tip of dubhoff tube - sent for fluoro study 10/28 Ortho consultation: I do not feel that she is a surgical candidate. Therefore, I feel that it would best to manage this fracture nonsurgically and allow it to heal by itself over time, which it should.  10/28 Gen Surg consultation: Due to the lack of free air or free flow of contrast and the peritoneum there is no indication for any surgical intervention on this. Would recommend pulling back the feeding tube 1-2 cm. Would recheck an abdominal film to confirm no pneumoperitoneum in the morning. Absence of any changes okay to continue using Dobbhoff for feeding and medications. 10/28 gastrograffin study: The study confirms that the feeding tube pes perforated through the duodenum and the tip is within a cavity that fills with injected contrast. The cavity does appear walled off 11/4 refuses oral feeds, continue TF's CT reviewed: T8-T9 discitis/osteomyelitis, RT HIP FRACTURE 11/5 placed back on vasopressors, placed back on Vent due to resp acidosis, levophed turned off 11/6 afternoon 11/5 PM Trach changed out due to cuff leak, #6 Shiley cuffed, 11/6 dubhoff occluded, removed and replaced, new dubhoff shows tube in the antral stomach 11/15 refusing to take oral feeds 11/18 placed back on Vent for increased WOB,SOB. 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 10/18/2015; the patient developed a new left basal pneumonia; with recurrent sepsis.  10/19/2015; fevers resolved with IV acetaminophen. Tube  feeds changed from vital to Jevity.  12/7 CXR with  stable b/l opacities.  12/12 elevated K s/p HD-remains on AC mode 12/16-vomiting-hold feeds and re-assess in next 24 hrs 12/17- TF restarted at 1/2 goal (goal = 50cc/per) 12/18- elevated wbc, fever (101.4), recultured, restarted on ceftaz 12/19 Pericardial friction rub. Echocardiogram: LVEF 30-35%, anteroseptal hypokinesis or akinesis c/w anterior wall MI 12/20 Dr Sung Amabile discussed new echocardiogram findings with pt's husband and explained that this likely represents an anterior wall MI sometime in the previous couple of weeks. It was explained that she is not a candidate for any cardiology intervention. Code status was again discussed. 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 Pneumoniae, P. Mirabilis, VRE Wound 9/26 >> No growth Blood 9/23 >> NEG CDiff 9/27>>neg Stool Cx 10/15>> negative Cdiff 10/25>>neg Trach Aspirate 10/28>> light growth pseudomonas Blood 10/28 >> 1/2 GPC >>  Sputum cultures obtained 09/22/15 due to mucus plugging>>Pseudomonas BONE TISSUE Cx 11/3>>E.coli (ESBL), k. Pneumonia (daptomycin and septra) Bld Cx 11/27>>negative thus far.  Trach Asp 11/27>> grew out Pseudomonas, and Klebsiella pneumonia. Trach Asp 12/18>>PAN RESISTANT Klebsiella, Pseudomonas Bld Cx 12/18>>  ANTIMICROBIALS:  Aztreonam 9/23 >> 9/24 Vanc 9/23 >> 9/25 Vanc  9/26>>9/27 Daptomycin 9/27>> 10/12, 10/28>> off Meropenem 9/23 >> 10/14, 10/28 >> 10/31 Septra 11/8>> Zosyn 11/27,>> 11/30. Levaquin 11/29>> 11/29. Ceftaz 12/1>>12/11, 12/18>> Bactrim - chronic  VITAL SIGNS: Temp:  [98.3 F (36.8 C)-99.8 F (37.7 C)] 99 F (37.2 C) (12/23 1200) Pulse Rate:  [66-86] 77 (12/23 1400) Resp:  [0-26] 23 (12/23 1400) BP: (93-143)/(41-121) 108/51 mmHg (12/23 1400) SpO2:  [94 %-100 %] 95 % (12/23 1400) FiO2 (%):  [40 %] 40 % (12/23 1200) HEMODYNAMICS:   VENTILATOR SETTINGS: Vent Mode:  [-] PRVC FiO2 (%):  [40 %] 40 % Set Rate:  [14 bmp] 14 bmp Vt Set:  [530 mL] 530 mL PEEP:  [5 cmH20] 5 cmH20 Pressure Support:  [20 cmH20] 20 cmH20 INTAKE / OUTPUT:  Intake/Output Summary (Last 24 hours) at 11/11/15 1442 Last data filed at 11/11/15 1330  Gross per 24 hour  Intake 1926.48 ml  Output     64 ml  Net 1862.48 ml    Review of Systems  Unable to perform ROS  Unable to fully obtain, due to trach and critical illness Physical Exam  Vent dependent Flat affect HEENT WNL, NGT present Trach site clean Chest clear anteriorly Regular rhythm, rate normal, could not appreciate friction rub today Abdomen soft, + BS Ext cool, trace symmetric edema Severe sacral decubitus ulcer - site looks clean 12/21 Profoundly weak with muscle wasting  LABS:  CBC  Recent Labs Lab 11/09/15 1307 11/10/15 0930 11/11/15 0558  WBC 17.7* 23.1* 19.3*  HGB 7.6* 7.9* 7.6*  HCT 25.9* 26.5* 25.1*  PLT 216 230 217   Coag's No results for input(s): APTT, INR in the last 168 hours. BMET  Recent Labs Lab  11/09/15 1534 11/10/15 0933 11/11/15 0558  NA 143 138 143  K 3.4* 3.6 3.9  CL 100* 96* 104  CO2 33* 31 33*  BUN 39* 47* 61*  CREATININE 0.85 1.23* 1.49*  GLUCOSE 92 279* 153*   Electrolytes  Recent Labs Lab 11/07/15 1810 11/09/15 1307 11/09/15 1534 11/10/15 0933 11/11/15 0558  CALCIUM 8.7* 8.5* 8.4* 8.1* 8.5*  MG  --   --  1.8  --   --   PHOS  4.7* 3.3 2.4*  --   --    Sepsis Markers No results for input(s): LATICACIDVEN, PROCALCITON, O2SATVEN in the last 168 hours. ABG No results for input(s): PHART, PCO2ART, PO2ART in the last 168 hours. Liver Enzymes  Recent Labs Lab 11/07/15 1810 11/09/15 1307 11/09/15 1534  AST  --   --  68*  ALT  --   --  43  ALKPHOS  --   --  1677*  BILITOT  --   --  1.2  ALBUMIN 1.9* 1.8* 2.0*   Cardiac Enzymes  Recent Labs Lab 11/09/15 1534 11/10/15 0125 11/10/15 0930  TROPONINI 0.11* 0.08* 0.28*   Glucose  Recent Labs Lab 11/10/15 1613 11/10/15 2027 11/11/15 0040 11/11/15 0408 11/11/15 0718 11/11/15 1203  GLUCAP 129* 100* 120* 158* 139* 109*    Imaging No results found.  ASSESSMENT / PLAN: IMPRESSION: Very prolonged and complicated hospitalization after gastric bypass surgery 2014 Never returned home ESRD Recurrent severe sepsis - PAN resistant Klebisella in sputum Recurrent PNA H/O C diff H/O VTE DM2 - controlled Episodic hypoglycemia Chronic steroid therapy, for a history of of adrenal insufficiency Incidental finding of R hip fx - conservative mgmt. 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 Flutter.  Cards following  Billy Fischeravid Jeanette Moffatt, MD PCCM service Mobile [phone] Pager [phone]

## 2015-11-11 NOTE — Progress Notes (Signed)
Patient: Courtney [name] / Admit Date: 07/22/2015 / Date of Encounter: 11/11/2015, 8:06 AM   Subjective: No recurrent A-fib on po Amiodarone.   Review of Systems: Review of Systems  Unable to perform ROS: intubated     Objective: Telemetry: Episodes of Afib with RVR into the 150s to 180s on 12/21, currently in NSR in the 70s Physical Exam: Blood pressure 111/52, pulse 68, temperature 98.5 F (36.9 C), temperature source Oral, resp. rate 20, height 5\' 9"  (1.753 m), weight 177 lb 11.1 oz (80.6 kg), SpO2 98 %. Body mass index is 26.23 kg/(m^2). General: Critically ill, in no acute distress. Head: Normocephalic, atraumatic, sclera non-icteric, no xanthomas, nares are without discharge. Neck: Negative for carotid bruits. JVP not elevated. Lungs: Decreased breath sounds bilaterally. Breathing is unlabored. Heart: RRR S1 S2 without murmurs, rubs, or gallops.  Abdomen: Soft, non-tender, non-distended with normoactive bowel sounds. No rebound/guarding. Extremities: No clubbing or cyanosis. No edema. Distal pedal pulses are 2+ and equal bilaterally. Neuro: Intubated. Psych:  Intubated.   Intake/Output Summary (Last 24 hours) at 11/11/15 0806 Last data filed at 11/11/15 0600  Gross per 24 hour  Intake 2209.48 ml  Output      0 ml  Net 2209.48 ml    Inpatient Medications:  . amiodarone  400 mg Oral BID  . antiseptic oral rinse  7 mL Mouth Rinse QID  . aspirin  81 mg Oral Daily  . cefTAZidime (FORTAZ)  IV  1 g Intravenous Q24H  . chlorhexidine gluconate  15 mL Mouth Rinse BID  . epoetin (EPOGEN/PROCRIT) injection  10,000 Units Intravenous Q M,W,F-HD  . escitalopram  10 mg Oral Daily  . feeding supplement (PRO-STAT SUGAR FREE 64)  30 mL Oral 6 X Daily  . folic acid  1 mg Oral Daily  . free water  20 mL Per Tube 6 times per day  . heparin subcutaneous  5,000 Units Subcutaneous Q12H  . hydrocortisone  10 mg Per Tube BID  . insulin aspart  0-9 Units Subcutaneous 6 times per  day  . lidocaine  1 patch Transdermal Q24H  . loperamide  2 mg Oral BID  . midodrine  5 mg Per Tube TID WC  . mirtazapine  7.5 mg Oral QHS  . multivitamin  5 mL Oral Daily  . pantoprazole sodium  40 mg Per Tube Daily  . saccharomyces boulardii  250 mg Oral BID  . sodium chloride  10-40 mL Intracatheter Q12H  . sodium hypochlorite   Irrigation BID  . thiamine  100 mg Per Tube Daily   Infusions:  . dextrose 5 % and 0.9% NaCl 25 mL/hr at 11/11/15 0311  . feeding supplement (JEVITY 1.5 CAL/FIBER) 1,000 mL (11/11/15 0300)  . norepinephrine 2 mcg/min (11/11/15 0702)    Labs:  Recent Labs  11/09/15 1307 11/09/15 1534 11/10/15 0933 11/11/15 0558  NA 144 143 138 143  K 4.3 3.4* 3.6 3.9  CL 105 100* 96* 104  CO2 33* 33* 31 33*  GLUCOSE 98 92 279* 153*  BUN 64* 39* 47* 61*  CREATININE 1.41* 0.85 1.23* 1.49*  CALCIUM 8.5* 8.4* 8.1* 8.5*  MG  --  1.8  --   --   PHOS 3.3 2.4*  --   --     Recent Labs  11/09/15 1307 11/09/15 1534  AST  --  68*  ALT  --  43  ALKPHOS  --  1677*  BILITOT  --  1.2  PROT  --  6.1*  ALBUMIN 1.8* 2.0*    Recent Labs  11/10/15 0930 11/11/15 0558  WBC 23.1* 19.3*  NEUTROABS  --  16.7*  HGB 7.9* 7.6*  HCT 26.5* 25.1*  MCV 95.4 96.6  PLT 230 217    Recent Labs  11/09/15 1534 11/10/15 0125 11/10/15 0930  CKTOTAL 5* 5*  --   TROPONINI 0.11* 0.08* 0.28*   Invalid input(s): POCBNP No results for input(s): HGBA1C in the last 72 hours.   Weights: Filed Weights   11/08/15 0428 11/09/15 0454  Weight: 177 lb 11.1 oz (80.6 kg) 177 lb 11.1 oz (80.6 kg)     Radiology/Studies:  Dg Chest 1 View  11/06/2015  CLINICAL DATA:  Patient unable to communicate. Follow-up chest x-ray. History of hypertension, coronary artery disease and IBD. EXAM: CHEST 1 VIEW COMPARISON:  Chest x-rays dated 10/26/2015, 10/19/2015 and 10/16/2015 FINDINGS: Tracheostomy remains well position. Right-sided dialysis catheter remains well positioned with tip in the  expected region of the cavoatrial junction. Feeding tube passes below the diaphragm. Left IJ central line tip remains adequately positioned with tip in the upper SVC. Moderate cardiomegaly is unchanged. Overall cardiomediastinal silhouette is stable in size and configuration. The patchy bilateral airspace opacities, left greater than right, are unchanged in the short-term interval but increased compared to the earlier chest x-ray of 09/18/2015. Suspect small bilateral pleural effusions and bibasilar atelectasis. IMPRESSION: No new lung findings compared to most recent chest x-ray of 10/26/2015. Continued pulmonary edema pattern bilaterally, pneumonia less likely. Suspect superimposed atelectasis at each lung base and small bilateral pleural effusions. Tubes and lines are stable in position. Electronically Signed   By: Bary RichardStan  Maynard M.D.   On: 11/06/2015 10:30   Dg Chest 1 View  10/19/2015  CLINICAL DATA:  Shortness of Breath EXAM: CHEST 1 VIEW COMPARISON:  October 18, 2015 FINDINGS: Tracheostomy catheter tip is 6.4 cm above the carina. Right-sided central catheter tip is just beyond the cavoatrial junction in the right atrium. The left-sided central catheter tip is in the superior vena cava. Feeding tube tip is not seen; feeding tube tip is below the diaphragm. No pneumothorax. There is stable cardiomegaly with pulmonary venous hypertension. There is interstitial edema, more on the left than on the right, stable. There is patchy consolidation in the left lower lobe, stable. No new opacity. No adenopathy. IMPRESSION: Evidence of a degree of congestive heart failure. Question superimposed pneumonia left lower lobe. No new opacity. No change in cardiac silhouette. Tube and catheter positions as described. Electronically Signed   By: Bretta BangWilliam  Woodruff III M.D.   On: 10/19/2015 07:17   Dg Chest 1 View  10/18/2015  CLINICAL DATA:  Dyspnea. EXAM: CHEST 1 VIEW COMPARISON:  October 16, 2015. FINDINGS: Stable  cardiomediastinal silhouette. Tracheostomy and feeding tube are unchanged in position. Right internal jugular dialysis catheter is unchanged in position. Left internal jugular central venous catheter is unchanged with distal tip in expected position of the SVC. No pneumothorax is noted. Mild central pulmonary vascular congestion is again noted. Stable left perihilar and basilar opacity is noted concerning for pneumonia or edema with possible minimal left pleural effusion. Stable mild right basilar opacity is noted. Bony thorax is intact. IMPRESSION: Stable support apparatus. Stable bilateral lung opacities, with left greater than right. Electronically Signed   By: Lupita RaiderJames  Green Jr, M.D.   On: 10/18/2015 07:38   Dg Abd 1 View  11/04/2015  CLINICAL DATA:  Feeding tube placement EXAM: ABDOMEN - 1 VIEW COMPARISON:  September 25, 2015 FINDINGS: Feeding tube tip is in the body of the stomach. The bowel gas pattern is normal. There is extensive intraperitoneal barium, a stable finding. Sclerotic lesions are identified in the iliac crests, stable. IMPRESSION: Feeding tube tip in body of stomach. Areas of intraperitoneal barium are consistent with prior bowel perforation. Sclerotic areas in the bony structures 9 potentially be due to long-term chronic renal failure. Bony metastatic disease is a differential consideration, however. Electronically Signed   By: Bretta Bang III M.D.   On: 11/04/2015 10:24   US Venous Img Lower Bilateral  11/03/2015  CLINICAL DATA:  Swelling for 3 months. EXAM: BILATERAL LOWER EXTREMITY VENOUS DOPPLER ULTRASOUND TECHNIQUE: Gray-scale sonography with graded compression, as well as color Doppler and duplex ultrasound were performed to evaluate the lower extremity deep venous systems from the level of the common femoral vein and including the common femoral, femoral, profunda femoral, popliteal and calf veins including the posterior tibial, peroneal and gastrocnemius veins when visible.  The superficial great saphenous vein was also interrogated. Spectral Doppler was utilized to evaluate flow at rest and with distal augmentation maneuvers in the common femoral, femoral and popliteal veins. COMPARISON:  08/28/2015 FINDINGS: RIGHT LOWER EXTREMITY Normal compressibility and color Doppler flow in the right common femoral vein. The right profunda femoral vein is patent without thrombus. There is a small amount of echogenic material along the wall of the proximal right femoral vein but this vessel appears to be compressible. This area is difficult to evaluate due to body habitus. Normal compressibility and color Doppler flow in the remainder of the right femoral vein and the right popliteal vein. Limited evaluation of the deep calf veins due to body habitus but no clear evidence for thrombus. LEFT LOWER EXTREMITY Normal compressibility at the left common femoral vein and left saphenofemoral junction without thrombus. Normal compressibility in the proximal left femoral vein. Limited evaluation of the mid and distal left femoral vein due to body habitus. Normal compressibility in the left popliteal vein without thrombus. Limited evaluation of the calf veins due to body habitus but no clear evidence for thrombus. There is color Doppler flow in the left common femoral vein, profunda femoral vein and visualized left femoral vein. Normal color Doppler flow and augmentation in left popliteal vein. Other Findings:  None. IMPRESSION: Technically challenging examination due to body habitus. No evidence for an acute DVT in either lower extremity. A small amount of echogenic material along the wall of the proximal right femoral vein is indeterminate. This could represent old nonocclusive thrombus. Electronically Signed   By: Richarda Overlie M.D.   On: 11/03/2015 12:56   Dg Chest Port 1 View  11/09/2015  CLINICAL DATA:  Respiratory failure EXAM: PORTABLE CHEST - 1 VIEW COMPARISON:  11/06/2015 FINDINGS: Cardiac shadow  remains enlarged. Patchy infiltrates are noted bilaterally and stable. A left jugular PICC line, dialysis catheter, tracheostomy tube and feeding catheter are again seen. Mild deformity of the dialysis catheter is noted at its turned in the neck. Correlation with any limitations of flow is recommended. No bony abnormality is seen. IMPRESSION: Patchy infiltrates bilaterally. The overall appearance is stable from the prior study. Electronically Signed   By: Alcide Clever M.D.   On: 11/09/2015 16:06   Dg Chest Port 1 View  10/26/2015  CLINICAL DATA:  Respiratory failure, aspiration pneumonia, septic shock, CVA, history of coronary artery disease CHF and renal insufficiency EXAM: PORTABLE CHEST 1 VIEW COMPARISON:  Portable chest x-ray of October 19, 2015 FINDINGS:  The lungs are adequately inflated. There are persistent confluent alveolar opacities bilaterally. The left hemidiaphragm is partially obscured but slightly better demonstrated than on the previous study. The cardiac silhouette remains enlarged. The central pulmonary vascularity remains engorged. A dual-lumen dialysis catheter is in stable position with the distal tip at the cavoatrial junction. The feeding tube tip projects below the inferior margin of the image. The left internal jugular venous catheter tip projects over the proximal SVC. The tracheostomy appliance tip lies at the level of the superior margin of the clavicular heads. IMPRESSION: Stable bilateral alveolar opacities compatible with pneumonia and/or pulmonary edema. Slight improved aeration of the left lower lobe. The support tubes are in reasonable position. Electronically Signed   By: David  [place] M.D.   On: 10/26/2015 07:15   Dg Chest Port 1 View  10/16/2015  CLINICAL DATA:  Respiratory failure EXAM: PORTABLE CHEST 1 VIEW COMPARISON:  10/09/2015 FINDINGS: Tracheostomy tube and NG tube are unchanged. LEFT central venous line and RIGHT hemodialysis catheter unchanged. Stable cardiac  silhouette. There is new airspace disease in the LEFT lower lobe. Small bilateral pleural effusions. No pneumothorax. IMPRESSION: 1. Stable support apparatus. 2. New LEFT lower lobe airspace disease representing infiltrate or potentially aspiration pneumonitis. Asymmetric edema would also be considered. These results will be called to the ordering clinician or representative by the Radiologist Assistant, and communication documented in the PACS or zVision Dashboard. Electronically Signed   By: Genevive Bi M.D.   On: 10/16/2015 08:34     Assessment and Plan   1. New onset Afib: -Currently in sinus rhythm with heart rate. Off Amiodarone drip -Continue  po amiodarone 400 mg bid x 1 week, 200 mg bid x 1 week, then 200 mg daily thereafter -Given her recent history of GIB and anemia with hgb of 7.9 will hold on starting full dose anticoagulation   2. Elevated troponin: -Current elevation likely in the setting of her Afib with RVR -Conservative management at this time given the acuity of the patient's condition   3. Ischemic cardiomyopathy/presumed CAD: -Undermined when/if ischemic event occurred  -Volume management per dialysis -Requiring Levophed, which precludes usage of beta blocker at this time. Could start when/if able -Significant drop in EF between admission echo and echo on 12/19 - We can consider future ischemic evaluation if there is meaningful recovery.   4. Respiratory failure: -Vent per PCCM   Will sign off. Please call with questions.   Signed,  Lorine Bears, MD   11/11/2015, 8:06 AM

## 2015-11-12 ENCOUNTER — Inpatient Hospital Stay: Payer: 59

## 2015-11-12 DIAGNOSIS — Z8619 Personal history of other infectious and parasitic diseases: Secondary | ICD-10-CM

## 2015-11-12 DIAGNOSIS — I429 Cardiomyopathy, unspecified: Secondary | ICD-10-CM

## 2015-11-12 LAB — GLUCOSE, CAPILLARY
GLUCOSE-CAPILLARY: 164 mg/dL — AB (ref 65–99)
GLUCOSE-CAPILLARY: 161 mg/dL — AB (ref 65–99)

## 2015-11-12 NOTE — Progress Notes (Signed)
Subjective:   Patient did poorly with dialysis this past week No acute c/o today Able to acknowledge and follow simple commands   Objective:  Vital signs in last 24 hours:  Temp:  [98.2 F (36.8 C)-99.5 F (37.5 C)] 98.9 F (37.2 C) (12/24 0815) Pulse Rate:  [69-86] 77 (12/24 0700) Resp:  [3-26] 23 (12/24 0700) BP: (90-143)/(40-121) 96/48 mmHg (12/24 0700) SpO2:  [87 %-99 %] 96 % (12/24 0700) FiO2 (%):  [40 %] 40 % (12/24 0545)  Weight change:  Filed Weights   11/09/15 0454  Weight: 80.6 kg (177 lb 11.1 oz)    Intake/Output: I/O last 3 completed shifts: In: 2259 [I.V.:694; NG/GT:1515; IV Piggyback:50] Out: 64 [Other:64]   Intake/Output this shift:  Total I/O In: 110 [I.V.:10; NG/GT:50; IV Piggyback:50] Out: -   Physical Exam: General: critically ill appearing  Head/ENT: OM moist, NG tube  Eyes: Anicteric, eyes open  Neck: Tracheostomy in place  Lungs:  diffuse rhonchi, vent assisted  PRVC fio2 40%  Heart: S1S2, no pericardial rub noted now, 2/6 murmur  Abdomen:  Soft, nontender, BS present  Extremities: 3+ dependent edema, anasarca  Neurologic:  able to follow simple commands  Access:  R IJ permcath.       Basic Metabolic Panel:  Recent Labs Lab 11/07/15 1810 11/09/15 1307 11/09/15 1534 11/10/15 0933 11/11/15 0558  NA 146* 144 143 138 143  K 5.0 4.3 3.4* 3.6 3.9  CL 104 105 100* 96* 104  CO2 34* 33* 33* 31 33*  GLUCOSE 116* 98 92 279* 153*  BUN 80* 64* 39* 47* 61*  CREATININE 1.73* 1.41* 0.85 1.23* 1.49*  CALCIUM 8.7* 8.5* 8.4* 8.1* 8.5*  MG  --   --  1.8  --   --   PHOS 4.7* 3.3 2.4*  --   --     Liver Function Tests:  Recent Labs Lab 11/07/15 1810 11/09/15 1307 11/09/15 1534  AST  --   --  68*  ALT  --   --  43  ALKPHOS  --   --  1677*  BILITOT  --   --  1.2  PROT  --   --  6.1*  ALBUMIN 1.9* 1.8* 2.0*   No results for input(s): LIPASE, AMYLASE in the last 168 hours. No results for input(s): AMMONIA in the last 168  hours.  CBC:  Recent Labs Lab 11/06/15 0819 11/07/15 1750 11/09/15 1307 11/10/15 0930 11/11/15 0558  WBC 15.7* 14.7* 17.7* 23.1* 19.3*  NEUTROABS  --   --   --   --  16.7*  HGB 6.3* 7.9* 7.6* 7.9* 7.6*  HCT 21.2* 26.0* 25.9* 26.5* 25.1*  MCV 98.2 95.9 97.0 95.4 96.6  PLT 208 209 216 230 217    Cardiac Enzymes:  Recent Labs Lab 11/09/15 1534 11/10/15 0125 11/10/15 0930  CKTOTAL 5* 5*  --   TROPONINI 0.11* 0.08* 0.28*    BNP: Invalid input(s): POCBNP  CBG:  Recent Labs Lab 11/11/15 1203 11/11/15 1604 11/11/15 1956 11/11/15 2333 11/12/15 0749  GLUCAP 109* 136* 124* 119* 164*    Microbiology: Results for orders placed or performed during the hospital encounter of 2015/08/27  Blood Culture (routine x 2)     Status: None   Collection Time: 08-27-15  8:51 AM  Result Value Ref Range Status   Specimen Description BLOOD Courtney [name]  Final   Special Requests BOTTLES DRAWN AEROBIC AND ANAEROBIC  3CC  Final   Culture NO GROWTH 5 DAYS  Final  Report Status 08/17/2015 FINAL  Final  Blood Culture (routine x 2)     Status: None   Collection Time: 08/17/2015  9:20 AM  Result Value Ref Range Status   Specimen Description BLOOD LEFT ARM  Final   Special Requests BOTTLES DRAWN AEROBIC AND ANAEROBIC  1CC  Final   Culture NO GROWTH 5 DAYS  Final   Report Status 08/17/2015 FINAL  Final  Wound culture     Status: None   Collection Time: 08/04/2015  9:20 AM  Result Value Ref Range Status   Specimen Description DECUBITIS  Final   Special Requests Normal  Final   Gram Stain   Final    FEW WBC SEEN MANY GRAM NEGATIVE RODS RARE GRAM POSITIVE COCCI    Culture   Final    HEAVY GROWTH ESCHERICHIA COLI MODERATE GROWTH ENTEROBACTER AEROGENES PROTEUS MIRABILIS HEAVY GROWTH ENTEROCOCCUS SPECIES VRE HAVE INTRINSIC RESISTANCE TO MOST COMMONLY USED ANTIBIOTICS AND THE ABILITY TO ACQUIRE RESISTANCE TO MOST AVAILABLE ANTIBIOTICS.    Report Status 08/16/2015 FINAL  Final   Organism ID,  Bacteria ESCHERICHIA COLI  Final   Organism ID, Bacteria ENTEROBACTER AEROGENES  Final   Organism ID, Bacteria PROTEUS MIRABILIS  Final   Organism ID, Bacteria ENTEROCOCCUS SPECIES  Final      Susceptibility   Enterobacter aerogenes - MIC*    CEFTAZIDIME <=1 SENSITIVE Sensitive     CEFAZOLIN >=64 RESISTANT Resistant     CEFTRIAXONE <=1 SENSITIVE Sensitive     CIPROFLOXACIN <=0.25 SENSITIVE Sensitive     GENTAMICIN <=1 SENSITIVE Sensitive     IMIPENEM 1 SENSITIVE Sensitive     TRIMETH/SULFA <=20 SENSITIVE Sensitive     * MODERATE GROWTH ENTEROBACTER AEROGENES   Escherichia coli - MIC*    AMPICILLIN <=2 SENSITIVE Sensitive     CEFTAZIDIME <=1 SENSITIVE Sensitive     CEFAZOLIN <=4 SENSITIVE Sensitive     CEFTRIAXONE <=1 SENSITIVE Sensitive     CIPROFLOXACIN <=0.25 SENSITIVE Sensitive     GENTAMICIN <=1 SENSITIVE Sensitive     IMIPENEM <=0.25 SENSITIVE Sensitive     TRIMETH/SULFA <=20 SENSITIVE Sensitive     * HEAVY GROWTH ESCHERICHIA COLI   Proteus mirabilis - MIC*    AMPICILLIN >=32 RESISTANT Resistant     CEFTAZIDIME <=1 SENSITIVE Sensitive     CEFAZOLIN 8 SENSITIVE Sensitive     CEFTRIAXONE <=1 SENSITIVE Sensitive     CIPROFLOXACIN <=0.25 SENSITIVE Sensitive     GENTAMICIN <=1 SENSITIVE Sensitive     IMIPENEM 1 SENSITIVE Sensitive     TRIMETH/SULFA <=20 SENSITIVE Sensitive     * PROTEUS MIRABILIS   Enterococcus species - MIC*    AMPICILLIN >=32 RESISTANT Resistant     VANCOMYCIN >=32 RESISTANT Resistant     GENTAMICIN SYNERGY SENSITIVE Sensitive     TETRACYCLINE Value in next row Resistant      RESISTANT>=16    * HEAVY GROWTH ENTEROCOCCUS SPECIES  MRSA PCR Screening     Status: None   Collection Time: 08/10/2015  2:38 PM  Result Value Ref Range Status   MRSA by PCR NEGATIVE NEGATIVE Final    Comment:        The GeneXpert MRSA Assay (FDA approved for NASAL specimens only), is one component of a comprehensive MRSA colonization surveillance program. It is not intended to  diagnose MRSA infection nor to guide or monitor treatment for MRSA infections.   Blood culture (single)     Status: None   Collection Time: 08/08/2015  3:36  PM  Result Value Ref Range Status   Specimen Description BLOOD RIGHT ASSIST CONTROL  Final   Special Requests BOTTLES DRAWN AEROBIC AND ANAEROBIC 5ML  Final   Culture NO GROWTH 5 DAYS  Final   Report Status 08/17/2015 FINAL  Final  Wound culture     Status: None   Collection Time: 08/13/15 12:37 PM  Result Value Ref Range Status   Specimen Description WOUND  Final   Special Requests Normal  Final   Gram Stain   Final    FEW WBC SEEN TOO NUMEROUS TO COUNT GRAM NEGATIVE RODS FEW GRAM POSITIVE COCCI    Culture   Final    HEAVY GROWTH ESCHERICHIA COLI MODERATE GROWTH PROTEUS MIRABILIS LIGHT GROWTH KLEBSIELLA PNEUMONIAE MODERATE GROWTH ENTEROCOCCUS GALLINARUM CRITICAL RESULT CALLED TO, READ BACK BY AND VERIFIED WITH: [facility] AT 1042 08/16/15 DV    Report Status 08/17/2015 FINAL  Final   Organism ID, Bacteria ESCHERICHIA COLI  Final   Organism ID, Bacteria PROTEUS MIRABILIS  Final   Organism ID, Bacteria KLEBSIELLA PNEUMONIAE  Final   Organism ID, Bacteria ENTEROCOCCUS GALLINARUM  Final      Susceptibility   Escherichia coli - MIC*    AMPICILLIN >=32 RESISTANT Resistant     CEFTAZIDIME 4 RESISTANT Resistant     CEFAZOLIN >=64 RESISTANT Resistant     CEFTRIAXONE 16 RESISTANT Resistant     GENTAMICIN 2 SENSITIVE Sensitive     IMIPENEM >=16 RESISTANT Resistant     TRIMETH/SULFA <=20 SENSITIVE Sensitive     Extended ESBL POSITIVE Resistant     PIP/TAZO Value in next row Resistant      RESISTANT>=128    CIPROFLOXACIN Value in next row Sensitive      SENSITIVE<=0.25    * HEAVY GROWTH ESCHERICHIA COLI   Klebsiella pneumoniae - MIC*    AMPICILLIN Value in next row Resistant      SENSITIVE<=0.25    CEFTAZIDIME Value in next row Resistant      SENSITIVE<=0.25    CEFAZOLIN Value in next row Resistant      SENSITIVE<=0.25     CEFTRIAXONE Value in next row Resistant      SENSITIVE<=0.25    CIPROFLOXACIN Value in next row Resistant      SENSITIVE<=0.25    GENTAMICIN Value in next row Sensitive      SENSITIVE<=0.25    IMIPENEM Value in next row Resistant      SENSITIVE<=0.25    TRIMETH/SULFA Value in next row Resistant      SENSITIVE<=0.25    PIP/TAZO Value in next row Resistant      RESISTANT>=128    * LIGHT GROWTH KLEBSIELLA PNEUMONIAE   Proteus mirabilis - MIC*    AMPICILLIN Value in next row Resistant      RESISTANT>=128    CEFTAZIDIME Value in next row Sensitive      RESISTANT>=128    CEFAZOLIN Value in next row Sensitive      RESISTANT>=128    CEFTRIAXONE Value in next row Sensitive      RESISTANT>=128    CIPROFLOXACIN Value in next row Sensitive      RESISTANT>=128    GENTAMICIN Value in next row Sensitive      RESISTANT>=128    IMIPENEM Value in next row Sensitive      RESISTANT>=128    TRIMETH/SULFA Value in next row Sensitive      RESISTANT>=128    PIP/TAZO Value in next row Sensitive      SENSITIVE<=4    *  MODERATE GROWTH PROTEUS MIRABILIS   Enterococcus gallinarum - MIC*    AMPICILLIN Value in next row Resistant      SENSITIVE<=4    GENTAMICIN SYNERGY Value in next row Sensitive      SENSITIVE<=4    CIPROFLOXACIN Value in next row Resistant      RESISTANT>=8    TETRACYCLINE Value in next row Resistant      RESISTANT>=16    * MODERATE GROWTH ENTEROCOCCUS GALLINARUM  Wound culture     Status: None   Collection Time: 08/15/15  2:53 PM  Result Value Ref Range Status   Specimen Description WOUND  Final   Special Requests NONE  Final   Gram Stain FEW WBC SEEN NO ORGANISMS SEEN   Final   Culture NO GROWTH 3 DAYS  Final   Report Status 08/18/2015 FINAL  Final  C difficile quick scan w PCR reflex     Status: None   Collection Time: 08/17/15 11:34 AM  Result Value Ref Range Status   C Diff antigen NEGATIVE NEGATIVE Final   C Diff toxin NEGATIVE NEGATIVE Final   C Diff  interpretation Negative for C. difficile  Final  Stool culture     Status: None   Collection Time: 09/03/15  3:51 PM  Result Value Ref Range Status   Specimen Description STOOL  Final   Special Requests Immunocompromised  Final   Culture   Final    NO SALMONELLA OR SHIGELLA ISOLATED No Pathogenic E. coli detected NO CAMPYLOBACTER DETECTED    Report Status 09/07/2015 FINAL  Final  C difficile quick scan w PCR reflex     Status: None   Collection Time: 09/13/15 12:51 AM  Result Value Ref Range Status   C Diff antigen NEGATIVE NEGATIVE Final   C Diff toxin NEGATIVE NEGATIVE Final   C Diff interpretation Negative for C. difficile  Final  Culture, blood (routine x 2)     Status: None   Collection Time: 09/16/15 11:24 AM  Result Value Ref Range Status   Specimen Description BLOOD LEFT HAND  Final   Special Requests BOTTLES DRAWN AEROBIC AND ANAEROBIC  1CC  Final   Culture NO GROWTH 5 DAYS  Final   Report Status 09/21/2015 FINAL  Final  Culture, blood (routine x 2)     Status: None   Collection Time: 09/16/15 12:23 PM  Result Value Ref Range Status   Specimen Description BLOOD RIGHT HAND  Final   Special Requests BOTTLES DRAWN AEROBIC AND ANAEROBIC  1CC  Final   Culture  Setup Time   Final    GRAM POSITIVE COCCI AEROBIC BOTTLE ONLY CRITICAL RESULT CALLED TO, READ BACK BY AND VERIFIED WITH: TESS THOMAS,RN 09/17/2015 0631 BY JRS.    Culture   Final    ENTEROCOCCUS FAECALIS AEROBIC BOTTLE ONLY VRE HAVE INTRINSIC RESISTANCE TO MOST COMMONLY USED ANTIBIOTICS AND THE ABILITY TO ACQUIRE RESISTANCE TO MOST AVAILABLE ANTIBIOTICS. CRITICAL RESULT CALLED TO, READ BACK BY AND VERIFIED WITH: CHERYL SMITH AT 1610 09/19/15 DV    Report Status 09/21/2015 FINAL  Final   Organism ID, Bacteria ENTEROCOCCUS FAECALIS  Final      Susceptibility   Enterococcus faecalis - MIC*    AMPICILLIN <=2 SENSITIVE Sensitive     LINEZOLID 2 SENSITIVE Sensitive     CIPROFLOXACIN Value in next row Resistant       RESISTANT>=8    TETRACYCLINE Value in next row Resistant      RESISTANT>=16    VANCOMYCIN Value  in next row Resistant      RESISTANT>=32    GENTAMICIN SYNERGY Value in next row Resistant      RESISTANT>=32    * ENTEROCOCCUS FAECALIS  Culture, respiratory (NON-Expectorated)     Status: None   Collection Time: 09/16/15  3:50 PM  Result Value Ref Range Status   Specimen Description TRACHEAL ASPIRATE  Final   Special Requests Immunocompromised  Final   Gram Stain   Final    FEW WBC SEEN GOOD SPECIMEN - 80-90% WBCS RARE GRAM NEGATIVE RODS    Culture LIGHT GROWTH PSEUDOMONAS AERUGINOSA  Final   Report Status 09/19/2015 FINAL  Final   Organism ID, Bacteria PSEUDOMONAS AERUGINOSA  Final      Susceptibility   Pseudomonas aeruginosa - MIC*    CEFTAZIDIME 8 SENSITIVE Sensitive     CIPROFLOXACIN 2 INTERMEDIATE Intermediate     GENTAMICIN >=16 RESISTANT Resistant     IMIPENEM >=16 RESISTANT Resistant     PIP/TAZO Value in next row Sensitive      SENSITIVE32    CEFEPIME Value in next row Sensitive      SENSITIVE8    LEVOFLOXACIN Value in next row Resistant      RESISTANT>=8    * LIGHT GROWTH PSEUDOMONAS AERUGINOSA  Culture, expectorated sputum-assessment     Status: None   Collection Time: 09/22/15  2:09 PM  Result Value Ref Range Status   Specimen Description ENDOTRACHEAL  Final   Special Requests Normal  Final   Sputum evaluation THIS SPECIMEN IS ACCEPTABLE FOR SPUTUM CULTURE  Final   Report Status 09/24/2015 FINAL  Final  Culture, respiratory (NON-Expectorated)     Status: None   Collection Time: 09/22/15  2:09 PM  Result Value Ref Range Status   Specimen Description ENDOTRACHEAL  Final   Special Requests Normal Reflexed from [id number]  Final   Gram Stain   Final    FEW WBC SEEN FEW GRAM NEGATIVE RODS POOR SPECIMEN - LESS THAN 70% WBCS    Culture   Final    MODERATE GROWTH PSEUDOMONAS AERUGINOSA LIGHT GROWTH KLEBSIELLA PNEUMONIAE REFER TO SENSITIVITIES FROM PREVIOUS CULTURE  FOR ORG 2    Report Status 10/01/2015 FINAL  Final   Organism ID, Bacteria PSEUDOMONAS AERUGINOSA  Final   Organism ID, Bacteria KLEBSIELLA PNEUMONIAE  Final      Susceptibility   Pseudomonas aeruginosa - MIC*    CEFTAZIDIME 4 SENSITIVE Sensitive     CIPROFLOXACIN 2 INTERMEDIATE Intermediate     GENTAMICIN >=16 RESISTANT Resistant     IMIPENEM >=16 RESISTANT Resistant     PIP/TAZO Value in next row Sensitive      SENSITIVE16    * MODERATE GROWTH PSEUDOMONAS AERUGINOSA  Tissue culture     Status: None   Collection Time: 09/24/15  6:44 AM  Result Value Ref Range Status   Specimen Description BONE  Final   Special Requests Normal  Final   Gram Stain MODERATE WBC SEEN FEW GRAM NEGATIVE RODS   Final   Culture   Final    MODERATE GROWTH ESCHERICHIA COLI MODERATE GROWTH KLEBSIELLA PNEUMONIAE ESBL-EXTENDED SPECTRUM BETA LACTAMASE-THE ORGANISM IS RESISTANT TO PENICILLINS, CEPHALOSPORINS AND AZTREONAM ACCORDING TO CLSI M100-S15 VOL.25 N01 JAN 2005. ORGANISM 1 This organism isolate is resistant to one or more antiotic agents in three or more antimicrobial categories.  Suggest Infectious Disease consult.   ORGANISM 2 CRITICAL RESULT CALLED TO, READ BACK BY AND VERIFIED WITH: RN Mardene Celeste LINDSAY 09/27/15 1005AM    Report Status 09/28/2015  FINAL  Final   Organism ID, Bacteria ESCHERICHIA COLI  Final   Organism ID, Bacteria KLEBSIELLA PNEUMONIAE  Final      Susceptibility   Escherichia coli - MIC*    AMPICILLIN >=32 RESISTANT Resistant     CEFTAZIDIME 4 RESISTANT Resistant     CEFAZOLIN >=64 RESISTANT Resistant     CEFTRIAXONE 8 RESISTANT Resistant     GENTAMICIN <=1 SENSITIVE Sensitive     IMIPENEM 8 RESISTANT Resistant     TRIMETH/SULFA <=20 SENSITIVE Sensitive     Extended ESBL POSITIVE Resistant     PIP/TAZO Value in next row Resistant      RESISTANT>=128    * MODERATE GROWTH ESCHERICHIA COLI   Klebsiella pneumoniae - MIC*    AMPICILLIN Value in next row Resistant      RESISTANT>=128     CEFTAZIDIME Value in next row Resistant      RESISTANT>=128    CEFAZOLIN Value in next row Resistant      RESISTANT>=128    CEFTRIAXONE Value in next row Resistant      RESISTANT>=128    CIPROFLOXACIN Value in next row Resistant      RESISTANT>=128    GENTAMICIN Value in next row Resistant      RESISTANT>=128    IMIPENEM Value in next row Resistant      RESISTANT>=128    TRIMETH/SULFA Value in next row Sensitive      RESISTANT>=128    PIP/TAZO Value in next row Resistant      RESISTANT>=128    * MODERATE GROWTH KLEBSIELLA PNEUMONIAE  Anaerobic culture     Status: None   Collection Time: 09/24/15  3:55 PM  Result Value Ref Range Status   Specimen Description BONE  Final   Special Requests Normal  Final   Culture NO ANAEROBES ISOLATED  Final   Report Status 09/29/2015 FINAL  Final  Culture, fungus without smear     Status: None   Collection Time: 09/24/15  3:55 PM  Result Value Ref Range Status   Specimen Description BONE  Final   Special Requests Normal  Final   Culture NO FUNGUS ISOLATED AFTER 24 DAYS  Final   Report Status 10/18/2015 FINAL  Final  C difficile quick scan w PCR reflex     Status: None   Collection Time: 10/07/15  2:02 PM  Result Value Ref Range Status   C Diff antigen NEGATIVE NEGATIVE Final   C Diff toxin NEGATIVE NEGATIVE Final   C Diff interpretation Negative for C. difficile  Final  Culture, blood (routine x 2)     Status: None   Collection Time: 10/16/15  9:52 AM  Result Value Ref Range Status   Specimen Description BLOOD LEFT HAND  Final   Special Requests   Final    BOTTLES DRAWN AEROBIC AND ANAEROBIC  AER 6CC ANA 4CC   Culture NO GROWTH 6 DAYS  Final   Report Status 10/22/2015 FINAL  Final  Culture, blood (routine x 2)     Status: None   Collection Time: 10/16/15 12:25 PM  Result Value Ref Range Status   Specimen Description BLOOD LEFT HAND  Final   Special Requests BOTTLES DRAWN AEROBIC AND ANAEROBIC  5CC  Final   Culture NO GROWTH 6 DAYS   Final   Report Status 10/22/2015 FINAL  Final  Culture, expectorated sputum-assessment     Status: None   Collection Time: 10/18/15  1:15 PM  Result Value Ref Range Status   Specimen  Description SPUTUM  Final   Special Requests NONE  Final   Sputum evaluation THIS SPECIMEN IS ACCEPTABLE FOR SPUTUM CULTURE  Final   Report Status 10/18/2015 FINAL  Final  Culture, respiratory (NON-Expectorated)     Status: None   Collection Time: 10/18/15  1:15 PM  Result Value Ref Range Status   Specimen Description SPUTUM  Final   Special Requests NONE Reflexed from [id number]  Final   Gram Stain   Final    GOOD SPECIMEN - 80-90% WBCS MODERATE WBC SEEN MANY GRAM NEGATIVE RODS    Culture   Final    HEAVY GROWTH PSEUDOMONAS AERUGINOSA MODERATE GROWTH KLEBSIELLA PNEUMONIAE CRITICAL RESULT CALLED TO, READ BACK BY AND VERIFIED WITHRod Mae AT 0814 10/21/15 DV KLEBSIELLA PNEUMONIAE This organism isolate is resistant to one or more antiotic agents in three or more antimicrobial categories.  Suggest Infectious Disease  consult.      Report Status 10/22/2015 FINAL  Final   Organism ID, Bacteria PSEUDOMONAS AERUGINOSA  Final   Organism ID, Bacteria KLEBSIELLA PNEUMONIAE  Final      Susceptibility   Klebsiella pneumoniae - MIC*    AMPICILLIN >=32 RESISTANT Resistant     CEFAZOLIN >=64 RESISTANT Resistant     CEFTRIAXONE >=64 RESISTANT Resistant     CIPROFLOXACIN >=4 RESISTANT Resistant     GENTAMICIN >=16 RESISTANT Resistant     IMIPENEM >=16 RESISTANT Resistant     NITROFURANTOIN >=512 RESISTANT Resistant     TRIMETH/SULFA <=20 SENSITIVE Sensitive     * MODERATE GROWTH KLEBSIELLA PNEUMONIAE   Pseudomonas aeruginosa - MIC*    CEFTAZIDIME 4 SENSITIVE Sensitive     CIPROFLOXACIN >=4 RESISTANT Resistant     GENTAMICIN 8 INTERMEDIATE Intermediate     IMIPENEM >=16 RESISTANT Resistant     PIP/TAZO Value in next row Sensitive      SENSITIVE32    * HEAVY GROWTH PSEUDOMONAS AERUGINOSA  C difficile  quick scan w PCR reflex     Status: None   Collection Time: 10/18/15  4:39 PM  Result Value Ref Range Status   C Diff antigen NEGATIVE NEGATIVE Final   C Diff toxin NEGATIVE NEGATIVE Final   C Diff interpretation Negative for C. difficile  Final  Culture, blood (Routine X 2) w Reflex to ID Panel     Status: None   Collection Time: 11/06/15  8:27 AM  Result Value Ref Range Status   Specimen Description BLOOD RIGHT HAND  Final   Special Requests BOTTLES DRAWN AEROBIC AND ANAEROBIC 3CC  Final   Culture NO GROWTH 5 DAYS  Final   Report Status 11/11/2015 FINAL  Final  Culture, blood (Routine X 2) w Reflex to ID Panel     Status: None   Collection Time: 11/06/15  8:29 AM  Result Value Ref Range Status   Specimen Description BLOOD LEFT HAND  Final   Special Requests BOTTLES DRAWN AEROBIC AND ANAEROBIC  1CC  Final   Culture NO GROWTH 5 DAYS  Final   Report Status 11/11/2015 FINAL  Final  Culture, expectorated sputum-assessment     Status: None   Collection Time: 11/06/15  8:41 AM  Result Value Ref Range Status   Specimen Description EXPECTORATED SPUTUM  Final   Special Requests Immunocompromised  Final   Sputum evaluation THIS SPECIMEN IS ACCEPTABLE FOR SPUTUM CULTURE  Final   Report Status 11/06/2015 FINAL  Final  Culture, respiratory (NON-Expectorated)     Status: None (Preliminary result)   Collection Time: 11/06/15  8:41 AM  Result Value Ref Range Status   Specimen Description EXPECTORATED SPUTUM  Final   Special Requests Immunocompromised Reflexed from [id number]  Final   Gram Stain   Final    MANY WBC SEEN MANY GRAM NEGATIVE RODS EXCELLENT SPECIMEN - 90-100% WBCS    Culture   Final    MODERATE GROWTH KLEBSIELLA PNEUMONIAE MODERATE GROWTH PSEUDOMONAS AERUGINOSA CRITICAL RESULT CALLED TO, READ BACK BY AND VERIFIED WITH: DR. FITZGERALD AT 1120 11/09/15 DV DR. FITZGERALD REQUESTED ORGANISM BE SENT OUT FOR TIGECYCLINE AND COLISTIN Sent to [facility] for further susceptibility testing.  11/11/15 CTJ    Report Status PENDING  Incomplete   Organism ID, Bacteria KLEBSIELLA PNEUMONIAE  Final   Organism ID, Bacteria PSEUDOMONAS AERUGINOSA  Final      Susceptibility   Klebsiella pneumoniae - MIC*    AMPICILLIN >=32 RESISTANT Resistant     CEFAZOLIN >=64 RESISTANT Resistant     CEFTRIAXONE 32 RESISTANT Resistant     CIPROFLOXACIN >=4 RESISTANT Resistant     GENTAMICIN >=16 RESISTANT Resistant     IMIPENEM >=16 RESISTANT Resistant     NITROFURANTOIN 256 RESISTANT Resistant     TRIMETH/SULFA >=320 RESISTANT Resistant     Extended ESBL POSITIVE Resistant     * MODERATE GROWTH KLEBSIELLA PNEUMONIAE   Pseudomonas aeruginosa - MIC*    CEFTAZIDIME >=64 RESISTANT Resistant     CIPROFLOXACIN 2 INTERMEDIATE Intermediate     GENTAMICIN >=16 RESISTANT Resistant     IMIPENEM >=16 RESISTANT Resistant     * MODERATE GROWTH PSEUDOMONAS AERUGINOSA    Coagulation Studies: No results for input(s): LABPROT, INR in the last 72 hours.  Urinalysis: No results for input(s): COLORURINE, LABSPEC, PHURINE, GLUCOSEU, HGBUR, BILIRUBINUR, KETONESUR, PROTEINUR, UROBILINOGEN, NITRITE, LEUKOCYTESUR in the last 72 hours.  Invalid input(s): APPERANCEUR    Imaging: No results found.   Medications:   . dextrose 5 % and 0.9% NaCl 10 mL/hr at 11/12/15 0700  . feeding supplement (JEVITY 1.5 CAL/FIBER) 1,000 mL (11/11/15 1406)  . norepinephrine Stopped (11/12/15 0751)   . amiodarone  400 mg Per Tube BID  . antiseptic oral rinse  7 mL Mouth Rinse QID  . aspirin  81 mg Oral Daily  . cefTAZidime (FORTAZ)  IV  1 g Intravenous Q24H  . chlorhexidine gluconate  15 mL Mouth Rinse BID  . epoetin (EPOGEN/PROCRIT) injection  10,000 Units Intravenous Q M,W,F-HD  . escitalopram  10 mg Oral Daily  . feeding supplement (PRO-STAT SUGAR FREE 64)  30 mL Oral 6 X Daily  . free water  20 mL Per Tube 6 times per day  . heparin subcutaneous  5,000 Units Subcutaneous Q12H  . hydrocortisone  10 mg Per Tube BID  .  lidocaine  1 patch Transdermal Q24H  . midodrine  10 mg Per Tube TID WC  . mirtazapine  7.5 mg Oral QHS  . multivitamin  5 mL Oral Daily  . pantoprazole sodium  40 mg Per Tube Daily  . saccharomyces boulardii  250 mg Oral BID  . sodium chloride  10-40 mL Intracatheter Q12H  . sodium hypochlorite   Irrigation BID   acetaminophen (TYLENOL) oral liquid 160 mg/5 mL, HYDROmorphone (DILAUDID) injection, ipratropium-albuterol, loperamide, morphine injection, ondansetron (ZOFRAN) IV, oxyCODONE, polyvinyl alcohol, sodium chloride, zinc oxide  Assessment/ Plan:  49 y.o. 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 Admitted on 07/22/2015  Echo: 11/07/19: moderately to severely reduced. The estimated ejection fraction was in the range of 30% to 35%. Regional wall motion abnormalities: Severe hypokinesis of the anterior and anteroseptal myocardium.  1. End-stage renal disease on hemodialysis on HD MWF. The patient has been on dialysis since October of 2014. R IJ permcath. Dialysis treatment with IV albumin for oncotic support. Midodrine and hydrocortisone ordered for blood pressure support.  - Continue MWF schedule.     - Did not do well with dialysis this week  2. Anemia of CKD. Hemoglobin 7.6 - Continue scheduled Epogen with dialysis.  -  1 unit PRBC  Given 12/18  3. Acute resp failure:  -Patient remains on the ventilator at this  point in time.   4. Sepsis: VRE in blood. Bone cultures E. Coli and klebsiella  - Treatment as per ID and ICU team:     5. Generalized edema/anasarca Patient is not able to tolerate fluid removal with dialysis      LOS: 92 Kelisha Dall 12/24/20168:44 AM

## 2015-11-12 NOTE — Progress Notes (Addendum)
While in room with pt and husband, during dressing change pt crying uncontrollably, pt was given pain meds prior to dsg change, after dsg change this RN witnessed pt's husband holding right hand and manipulating hand, wrist and fingers, pt noted to have tears, pt clearly screamed "John", pts husband noted to be smiling/laughing and stated to this RN " you know why I do that.Marland Kitchen.Marland Kitchen.I know she will respond". This RN encouraged husband to "stop" due to pt just with increased pain during and after extensive dsg change, nursing will cont to monitor patient  Above event also witnessed by Dondra SpryGail, NT

## 2015-11-12 NOTE — Plan of Care (Signed)
Problem: Phase II Progression Outcomes Goal: Dyspnea controlled w/progressive activity Outcome: Progressing No dyspnea at rest..  Vent settings unchanged.  Tracheal secretions greenish yellow Goal: ADLs completed with minimal assistance Outcome: Not Progressing Totally dependent with ADL's Goal: Activity at appropriate level-compared to baseline (UP IN CHAIR FOR HEMODIALYSIS)  Outcome: Not Progressing Bedrest with PROM Goal: Discharge plan remains appropriate-arrangements made Outcome: Not Progressing See caremanager note

## 2015-11-12 NOTE — Progress Notes (Signed)
PULMONARY / CRITICAL CARE MEDICINE   Name: Courtney [name] MRN: [id number] DOB: May 06, 1966    ADMISSION DATE:  08/31/2015  BRIEF HISTORY: 49 AAF who has been in medical facilities (hosp, LTAC, rehab) for 2 yrs following gastric bypass surgery with multiple complications. Now with chronic trach, ESRD, profound debilitation, severe sacral pressure ulcer. Was seemingly making progress and transferred to rehab facility approx one week prior to this admission. She was sent to [facility] ED with AMS and hypotension. Working dx of severe sepsis/septic shock due to infected sacral pressure ulcer. Since admission. Her course is been very complicated with numerous complications including septic shock and GI bleeding. Now with failure to wean from vent and possible MI event, repeat ECHO EF 30-35, possible old MI, severe hypokinesis of the nnterior and anteroseptal myocardium   INTERVAL HISTORY: No new issues. Remains on low dose NE. No further tachyarrhythmias reported  Summary of MAJOR EVENTS/TEST RESULTS: Admission 02/07/14-05/07/14 Admission 07/21/14-09/06/14 Discharged to [facility]. Pt had palliative consult at that time, were asked to sign off by husband.  09/23 CT head: NAD 09/23 EEG: no epileptiform activity 09/23 PRBCs for Hgb 6.4 09/24 bedside debridement of sacral wound. Abscess drained 09/25 Off vasopressors. More alert. No distress. Worsening thrombocytopenia. Vanc DC'd 09/29 Dr. Sampson Goon (I.D) excused from the case by patient's husband. 10/02 MRI -multiple infarcts 10/03 tracheal bleeding- transfused platelets 10/03 hospitalist service excused from the case by patient's husband 10/03 Echocardiogram ejection fraction was 55-60%, pulmonary systolic pressure was 39 mmHg 21/30 restart TF's at lower rate, attempt reg HD  10/12 Transferred to med-surg floor. Remains on PCCM service 10/14 SLP eval: pt unable to tolerate PMV adequately 10/17-will re-attempt PM valve-discussed with Speech  therapist 10/18 passed swallow eval-start pureed thick foods no thin liquids-continue NG feeds 10/19 transferred to step down for sepsis/aspiration pneumonia 10/19 cxr shows RLL opacity 10/21 sacral decub debride at bedside by surgery 10/22 started back on vasopressors while on HD 10/27 CT with osteo, R hip fx, unable to identify tip of dubhoff tube - sent for fluoro study 10/28 Ortho consultation: I do not feel that she is a surgical candidate. Therefore, I feel that it would best to manage this fracture nonsurgically and allow it to heal by itself over time, which it should.  10/28 Gen Surg consultation: Due to the lack of free air or free flow of contrast and the peritoneum there is no indication for any surgical intervention on this. Would recommend pulling back the feeding tube 1-2 cm. Would recheck an abdominal film to confirm no pneumoperitoneum in the morning. Absence of any changes okay to continue using Dobbhoff for feeding and medications. 10/28 gastrograffin study: The study confirms that the feeding tube pes perforated through the duodenum and the tip is within a cavity that fills with injected contrast. The cavity does appear walled off 11/4 refuses oral feeds, continue TF's CT reviewed: T8-T9 discitis/osteomyelitis, RT HIP FRACTURE 11/5 placed back on vasopressors, placed back on Vent due to resp acidosis, levophed turned off 11/6 afternoon 11/5 PM Trach changed out due to cuff leak, #6 Shiley cuffed, 11/6 dubhoff occluded, removed and replaced, new dubhoff shows tube in the antral stomach 11/15 refusing to take oral feeds 11/18 placed back on Vent for increased WOB,SOB. 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 10/18/2015; the patient developed a new left basal pneumonia; with recurrent sepsis.  10/19/2015; fevers resolved with IV acetaminophen. Tube  feeds changed from vital to Jevity.  12/7 CXR with  stable b/l opacities.  12/12 elevated K s/p HD-remains on AC mode 12/16-vomiting-hold feeds and re-assess in next 24 hrs 12/17- TF restarted at 1/2 goal (goal = 50cc/per) 12/18- elevated wbc, fever (101.4), recultured, restarted on ceftaz 12/19 Pericardial friction rub. Echocardiogram: LVEF 30-35%, anteroseptal hypokinesis or akinesis c/w anterior wall MI 12/20 Dr Sung Amabile discussed new echocardiogram findings with pt's husband and explained that this likely represents an anterior wall MI sometime in the previous couple of weeks. It was explained that she is not a candidate for any cardiology intervention. Code status was again discussed. 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 Pneumoniae, P. Mirabilis, VRE Wound 9/26 >> No growth Blood 9/23 >> NEG CDiff 9/27>>neg Stool Cx 10/15>> negative Cdiff 10/25>>neg Trach Aspirate 10/28>> light growth pseudomonas Blood 10/28 >> 1/2 GPC >>  Sputum cultures obtained 09/22/15 due to mucus plugging>>Pseudomonas BONE TISSUE Cx 11/3>>E.coli (ESBL), k. Pneumonia (daptomycin and septra) Bld Cx 11/27>>negative thus far.  Trach Asp 11/27>> grew out Pseudomonas, and Klebsiella pneumonia. Trach Asp 12/18>>PAN RESISTANT Klebsiella, Pseudomonas Bld Cx 12/18>> NEG  ANTIMICROBIALS:  Aztreonam 9/23 >> 9/24 Vanc 9/23 >> 9/25 Vanc  9/26>>9/27 Daptomycin 9/27>> 10/12, 10/28>> off Meropenem 9/23 >> 10/14, 10/28 >> 10/31 Septra 11/8>> Zosyn 11/27,>> 11/30. Levaquin 11/29>> 11/29. Ceftaz 12/1>>12/11, 12/18>> 12/24 Bactrim - chronic  VITAL SIGNS: Temp:  [98.2 F (36.8 C)-99.5 F (37.5 C)] 99.4 F (37.4 C) (12/24 1314) Pulse Rate:  [69-82] 74 (12/24 1300) Resp:  [0-26] 21 (12/24 1300) BP: (90-117)/(40-57) 112/56 mmHg (12/24 1300) SpO2:  [87 %-100 %] 96 % (12/24 1300) FiO2 (%):  [40 %] 40 % (12/24 1200) HEMODYNAMICS:   VENTILATOR SETTINGS: Vent Mode:  [-] PRVC FiO2 (%):  [40 %] 40 % Set Rate:  [14 bmp] 14 bmp Vt Set:  [530 mL] 530 mL PEEP:  [5 cmH20] 5 cmH20 Plateau Pressure:  [25 cmH20-29 cmH20] 29 cmH20 INTAKE / OUTPUT:  Intake/Output Summary (Last 24 hours) at 11/12/15 1421 Last data filed at 11/12/15 1300  Gross per 24 hour  Intake 1455.17 ml  Output      0 ml  Net 1455.17 ml    Review of Systems  Unable to perform ROS  Unable to fully obtain, due to trach and critical illness Physical Exam  Vent dependent Flat affect HEENT WNL, NGT present Trach site clean Chest clear anteriorly Regular rhythm, rate normal, could not appreciate friction rub today Abdomen soft, + BS Ext cool, trace symmetric edema Severe sacral decubitus ulcer - site looks clean 12/21 Profoundly weak with muscle wasting  LABS:  CBC  Recent Labs Lab 11/09/15 1307 11/10/15 0930 11/11/15 0558  WBC 17.7* 23.1* 19.3*  HGB 7.6* 7.9* 7.6*  HCT 25.9* 26.5* 25.1*  PLT 216 230 217   Coag's No results for input(s): APTT, INR in the last 168 hours. BMET  Recent Labs Lab 11/09/15 1534 11/10/15 0933 11/11/15 0558  NA 143 138 143  K 3.4* 3.6 3.9  CL 100* 96* 104  CO2 33* 31 33*  BUN 39* 47* 61*  CREATININE 0.85 1.23* 1.49*  GLUCOSE 92 279* 153*   Electrolytes  Recent Labs Lab 11/07/15 1810 11/09/15 1307 11/09/15 1534 11/10/15 0933 11/11/15 0558  CALCIUM 8.7* 8.5* 8.4* 8.1* 8.5*  MG  --   --  1.8  --    --   PHOS 4.7* 3.3 2.4*  --   --    Sepsis Markers No results for input(s): LATICACIDVEN, PROCALCITON, O2SATVEN in the last 168 hours. ABG No results for input(s): PHART, PCO2ART, PO2ART in the last 168 hours. Liver Enzymes  Recent Labs Lab 11/07/15 1810 11/09/15 1307 11/09/15 1534  AST  --   --  68*  ALT  --   --  43  ALKPHOS  --   --  1677*  BILITOT  --   --  1.2  ALBUMIN 1.9* 1.8* 2.0*   Cardiac Enzymes  Recent Labs Lab 11/09/15 1534 11/10/15 0125 11/10/15 0930  TROPONINI 0.11* 0.08* 0.28*   Glucose  Recent Labs Lab 11/11/15 1203 11/11/15 1604 11/11/15 1956 11/11/15 2333 11/12/15 0749 11/12/15 1117  GLUCAP 109* 136* 124* 119* 164* 161*    Imaging Dg Chest Port 1 View  11/12/2015  CLINICAL DATA:  Respiratory failure EXAM: PORTABLE CHEST 1 VIEW COMPARISON:  11/09/2015 FINDINGS: Tracheostomy in good position. Left jugular central venous catheter tip in the SVC. Right jugular dialysis catheter at the cavoatrial junction. No pneumothorax Mild improvement in bilateral airspace disease consistent with edema and bibasilar atelectasis. Small bilateral effusions. IMPRESSION: Slight improvement in bilateral airspace disease consistent edema. Improvement in bibasilar atelectasis. Electronically Signed   By: Marlan Palauharles  Clark M.D.   On: 11/12/2015 09:03    ASSESSMENT / PLAN: IMPRESSION: Very prolonged and complicated hospitalization after gastric bypass surgery 2014 Never returned home ESRD Recurrent severe sepsis - PAN resistant Klebisella in sputum Recurrent PNA H/O C diff H/O VTE DM2 - controlled Episodic hypoglycemia Chronic steroid therapy, for a history of of adrenal insufficiency Incidental finding of R hip fx - conservative mgmt. 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 Flutter.  Cards following  Husband updated @ bedside. He continues to express unrealistic goals of care  Billy Fischeravid Hector Venne, MD PCCM service Mobile [phone] Pager [phone]

## 2015-11-13 LAB — GLUCOSE, CAPILLARY
GLUCOSE-CAPILLARY: 117 mg/dL — AB (ref 65–99)
GLUCOSE-CAPILLARY: 171 mg/dL — AB (ref 65–99)
Glucose-Capillary: 152 mg/dL — ABNORMAL HIGH (ref 65–99)
Glucose-Capillary: 171 mg/dL — ABNORMAL HIGH (ref 65–99)

## 2015-11-13 NOTE — Progress Notes (Signed)
Subjective:   Patient did poorly with dialysis this past week No acute c/o today Able to acknowledge  TF @ 50 cc/hr   Objective:  Vital signs in last 24 hours:  Temp:  [98.8 F (37.1 C)-99.5 F (37.5 C)] 98.8 F (37.1 C) (12/25 0400) Pulse Rate:  [68-82] 75 (12/25 0700) Resp:  [0-26] 23 (12/25 0700) BP: (112-134)/(46-85) 134/76 mmHg (12/25 0700) SpO2:  [95 %-100 %] 99 % (12/25 0700) FiO2 (%):  [40 %] 40 % (12/25 0422)  Weight change:  Filed Weights    Intake/Output: I/O last 3 completed shifts: In: 2575 [I.V.:370; NG/GT:2155; IV Piggyback:50] Out: -    Intake/Output this shift:     Physical Exam: General: critically ill appearing  Head/ENT: OM moist, NG tube  Eyes: Anicteric, eyes open  Neck: Tracheostomy in place  Lungs:  diffuse rhonchi, vent assisted     Heart: S1S2, no pericardial rub noted now, 2/6 murmur  Abdomen:  Soft, nontender, BS present  Extremities: 3+ dependent edema, anasarca  Neurologic: Awakens to touch  Access:  R IJ permcath.       Basic Metabolic Panel:  Recent Labs Lab 11/07/15 1810 11/09/15 1307 11/09/15 1534 11/10/15 0933 11/11/15 0558  NA 146* 144 143 138 143  K 5.0 4.3 3.4* 3.6 3.9  CL 104 105 100* 96* 104  CO2 34* 33* 33* 31 33*  GLUCOSE 116* 98 92 279* 153*  BUN 80* 64* 39* 47* 61*  CREATININE 1.73* 1.41* 0.85 1.23* 1.49*  CALCIUM 8.7* 8.5* 8.4* 8.1* 8.5*  MG  --   --  1.8  --   --   PHOS 4.7* 3.3 2.4*  --   --     Liver Function Tests:  Recent Labs Lab 11/07/15 1810 11/09/15 1307 11/09/15 1534  AST  --   --  68*  ALT  --   --  43  ALKPHOS  --   --  1677*  BILITOT  --   --  1.2  PROT  --   --  6.1*  ALBUMIN 1.9* 1.8* 2.0*   No results for input(s): LIPASE, AMYLASE in the last 168 hours. No results for input(s): AMMONIA in the last 168 hours.  CBC:  Recent Labs Lab 11/06/15 0819 11/07/15 1750 11/09/15 1307 11/10/15 0930 11/11/15 0558  WBC 15.7* 14.7* 17.7* 23.1* 19.3*  NEUTROABS  --   --   --    --  16.7*  HGB 6.3* 7.9* 7.6* 7.9* 7.6*  HCT 21.2* 26.0* 25.9* 26.5* 25.1*  MCV 98.2 95.9 97.0 95.4 96.6  PLT 208 209 216 230 217    Cardiac Enzymes:  Recent Labs Lab 11/09/15 1534 11/10/15 0125 11/10/15 0930  CKTOTAL 5* 5*  --   TROPONINI 0.11* 0.08* 0.28*    BNP: Invalid input(s): POCBNP  CBG:  Recent Labs Lab 11/11/15 2333 11/12/15 0749 11/12/15 1117 11/13/15 0011 11/13/15 0733  GLUCAP 119* 164* 161* 152* 171*    Microbiology: Results for orders placed or performed during the hospital encounter of 07/25/2015  Blood Culture (routine x 2)     Status: None   Collection Time: 08/19/2015  8:51 AM  Result Value Ref Range Status   Specimen Description BLOOD [name]  Final   Special Requests BOTTLES DRAWN AEROBIC AND ANAEROBIC  3CC  Final   Culture NO GROWTH 5 DAYS  Final   Report Status 08/17/2015 FINAL  Final  Blood Culture (routine x 2)     Status: None   Collection Time: 08/18/2015  9:20 AM  Result Value Ref Range Status   Specimen Description BLOOD LEFT ARM  Final   Special Requests BOTTLES DRAWN AEROBIC AND ANAEROBIC  1CC  Final   Culture NO GROWTH 5 DAYS  Final   Report Status 08/17/2015 FINAL  Final  Wound culture     Status: None   Collection Time: 07/29/2015  9:20 AM  Result Value Ref Range Status   Specimen Description DECUBITIS  Final   Special Requests Normal  Final   Gram Stain   Final    FEW WBC SEEN MANY GRAM NEGATIVE RODS RARE GRAM POSITIVE COCCI    Culture   Final    HEAVY GROWTH ESCHERICHIA COLI MODERATE GROWTH ENTEROBACTER AEROGENES PROTEUS MIRABILIS HEAVY GROWTH ENTEROCOCCUS SPECIES VRE HAVE INTRINSIC RESISTANCE TO MOST COMMONLY USED ANTIBIOTICS AND THE ABILITY TO ACQUIRE RESISTANCE TO MOST AVAILABLE ANTIBIOTICS.    Report Status 08/16/2015 FINAL  Final   Organism ID, Bacteria ESCHERICHIA COLI  Final   Organism ID, Bacteria ENTEROBACTER AEROGENES  Final   Organism ID, Bacteria PROTEUS MIRABILIS  Final   Organism ID, Bacteria ENTEROCOCCUS  SPECIES  Final      Susceptibility   Enterobacter aerogenes - MIC*    CEFTAZIDIME <=1 SENSITIVE Sensitive     CEFAZOLIN >=64 RESISTANT Resistant     CEFTRIAXONE <=1 SENSITIVE Sensitive     CIPROFLOXACIN <=0.25 SENSITIVE Sensitive     GENTAMICIN <=1 SENSITIVE Sensitive     IMIPENEM 1 SENSITIVE Sensitive     TRIMETH/SULFA <=20 SENSITIVE Sensitive     * MODERATE GROWTH ENTEROBACTER AEROGENES   Escherichia coli - MIC*    AMPICILLIN <=2 SENSITIVE Sensitive     CEFTAZIDIME <=1 SENSITIVE Sensitive     CEFAZOLIN <=4 SENSITIVE Sensitive     CEFTRIAXONE <=1 SENSITIVE Sensitive     CIPROFLOXACIN <=0.25 SENSITIVE Sensitive     GENTAMICIN <=1 SENSITIVE Sensitive     IMIPENEM <=0.25 SENSITIVE Sensitive     TRIMETH/SULFA <=20 SENSITIVE Sensitive     * HEAVY GROWTH ESCHERICHIA COLI   Proteus mirabilis - MIC*    AMPICILLIN >=32 RESISTANT Resistant     CEFTAZIDIME <=1 SENSITIVE Sensitive     CEFAZOLIN 8 SENSITIVE Sensitive     CEFTRIAXONE <=1 SENSITIVE Sensitive     CIPROFLOXACIN <=0.25 SENSITIVE Sensitive     GENTAMICIN <=1 SENSITIVE Sensitive     IMIPENEM 1 SENSITIVE Sensitive     TRIMETH/SULFA <=20 SENSITIVE Sensitive     * PROTEUS MIRABILIS   Enterococcus species - MIC*    AMPICILLIN >=32 RESISTANT Resistant     VANCOMYCIN >=32 RESISTANT Resistant     GENTAMICIN SYNERGY SENSITIVE Sensitive     TETRACYCLINE Value in next row Resistant      RESISTANT>=16    * HEAVY GROWTH ENTEROCOCCUS SPECIES  MRSA PCR Screening     Status: None   Collection Time: 07/23/2015  2:38 PM  Result Value Ref Range Status   MRSA by PCR NEGATIVE NEGATIVE Final    Comment:        The GeneXpert MRSA Assay (FDA approved for NASAL specimens only), is one component of a comprehensive MRSA colonization surveillance program. It is not intended to diagnose MRSA infection nor to guide or monitor treatment for MRSA infections.   Blood culture (single)     Status: None   Collection Time: 07/21/2015  3:36 PM  Result  Value Ref Range Status   Specimen Description BLOOD RIGHT ASSIST CONTROL  Final   Special Requests BOTTLES DRAWN AEROBIC  AND ANAEROBIC  Final   Culture NO GROWTH 5 DAYS  Final   Report Status 08/17/2015 FINAL  Final  Wound culture     Status: None   Collection Time: 08/13/15 12:37 PM  Result Value Ref Range Status   Specimen Description WOUND  Final   Special Requests Normal  Final   Gram Stain   Final    FEW WBC SEEN TOO NUMEROUS TO COUNT GRAM NEGATIVE RODS FEW GRAM POSITIVE COCCI    Culture   Final    HEAVY GROWTH ESCHERICHIA COLI MODERATE GROWTH PROTEUS MIRABILIS LIGHT GROWTH KLEBSIELLA PNEUMONIAE MODERATE GROWTH ENTEROCOCCUS GALLINARUM CRITICAL RESULT CALLED TO, READ BACK BY AND VERIFIED WITH: [facility] BORBA AT 1042 08/16/15 DV    Report Status 08/17/2015 FINAL  Final   Organism ID, Bacteria ESCHERICHIA COLI  Final   Organism ID, Bacteria PROTEUS MIRABILIS  Final   Organism ID, Bacteria KLEBSIELLA PNEUMONIAE  Final   Organism ID, Bacteria ENTEROCOCCUS GALLINARUM  Final      Susceptibility   Escherichia coli - MIC*    AMPICILLIN >=32 RESISTANT Resistant     CEFTAZIDIME 4 RESISTANT Resistant     CEFAZOLIN >=64 RESISTANT Resistant     CEFTRIAXONE 16 RESISTANT Resistant     GENTAMICIN 2 SENSITIVE Sensitive     IMIPENEM >=16 RESISTANT Resistant     TRIMETH/SULFA <=20 SENSITIVE Sensitive     Extended ESBL POSITIVE Resistant     PIP/TAZO Value in next row Resistant      RESISTANT>=128    CIPROFLOXACIN Value in next row Sensitive      SENSITIVE<=0.25    * HEAVY GROWTH ESCHERICHIA COLI   Klebsiella pneumoniae - MIC*    AMPICILLIN Value in next row Resistant      SENSITIVE<=0.25    CEFTAZIDIME Value in next row Resistant      SENSITIVE<=0.25    CEFAZOLIN Value in next row Resistant      SENSITIVE<=0.25    CEFTRIAXONE Value in next row Resistant      SENSITIVE<=0.25    CIPROFLOXACIN Value in next row Resistant      SENSITIVE<=0.25    GENTAMICIN Value in next row  Sensitive      SENSITIVE<=0.25    IMIPENEM Value in next row Resistant      SENSITIVE<=0.25    TRIMETH/SULFA Value in next row Resistant      SENSITIVE<=0.25    PIP/TAZO Value in next row Resistant      RESISTANT>=128    * LIGHT GROWTH KLEBSIELLA PNEUMONIAE   Proteus mirabilis - MIC*    AMPICILLIN Value in next row Resistant      RESISTANT>=128    CEFTAZIDIME Value in next row Sensitive      RESISTANT>=128    CEFAZOLIN Value in next row Sensitive      RESISTANT>=128    CEFTRIAXONE Value in next row Sensitive      RESISTANT>=128    CIPROFLOXACIN Value in next row Sensitive      RESISTANT>=128    GENTAMICIN Value in next row Sensitive      RESISTANT>=128    IMIPENEM Value in next row Sensitive      RESISTANT>=128    TRIMETH/SULFA Value in next row Sensitive      RESISTANT>=128    PIP/TAZO Value in next row Sensitive      SENSITIVE<=4    * MODERATE GROWTH PROTEUS MIRABILIS   Enterococcus gallinarum - MIC*    AMPICILLIN Value in next row Resistant  SENSITIVE<=4    GENTAMICIN SYNERGY Value in next row Sensitive      SENSITIVE<=4    CIPROFLOXACIN Value in next row Resistant      RESISTANT>=8    TETRACYCLINE Value in next row Resistant      RESISTANT>=16    * MODERATE GROWTH ENTEROCOCCUS GALLINARUM  Wound culture     Status: None   Collection Time: 08/15/15  2:53 PM  Result Value Ref Range Status   Specimen Description WOUND  Final   Special Requests NONE  Final   Gram Stain FEW WBC SEEN NO ORGANISMS SEEN   Final   Culture NO GROWTH 3 DAYS  Final   Report Status 08/18/2015 FINAL  Final  C difficile quick scan w PCR reflex     Status: None   Collection Time: 08/17/15 11:34 AM  Result Value Ref Range Status   C Diff antigen NEGATIVE NEGATIVE Final   C Diff toxin NEGATIVE NEGATIVE Final   C Diff interpretation Negative for C. difficile  Final  Stool culture     Status: None   Collection Time: 09/03/15  3:51 PM  Result Value Ref Range Status   Specimen Description  STOOL  Final   Special Requests Immunocompromised  Final   Culture   Final    NO SALMONELLA OR SHIGELLA ISOLATED No Pathogenic E. coli detected NO CAMPYLOBACTER DETECTED    Report Status 09/07/2015 FINAL  Final  C difficile quick scan w PCR reflex     Status: None   Collection Time: 09/13/15 12:51 AM  Result Value Ref Range Status   C Diff antigen NEGATIVE NEGATIVE Final   C Diff toxin NEGATIVE NEGATIVE Final   C Diff interpretation Negative for C. difficile  Final  Culture, blood (routine x 2)     Status: None   Collection Time: 09/16/15 11:24 AM  Result Value Ref Range Status   Specimen Description BLOOD LEFT HAND  Final   Special Requests BOTTLES DRAWN AEROBIC AND ANAEROBIC  1CC  Final   Culture NO GROWTH 5 DAYS  Final   Report Status 09/21/2015 FINAL  Final  Culture, blood (routine x 2)     Status: None   Collection Time: 09/16/15 12:23 PM  Result Value Ref Range Status   Specimen Description BLOOD RIGHT HAND  Final   Special Requests BOTTLES DRAWN AEROBIC AND ANAEROBIC  1CC  Final   Culture  Setup Time   Final    GRAM POSITIVE COCCI AEROBIC BOTTLE ONLY CRITICAL RESULT CALLED TO, READ BACK BY AND VERIFIED WITH: TESS THOMAS,RN 09/17/2015 0631 BY JRS.    Culture   Final    ENTEROCOCCUS FAECALIS AEROBIC BOTTLE ONLY VRE HAVE INTRINSIC RESISTANCE TO MOST COMMONLY USED ANTIBIOTICS AND THE ABILITY TO ACQUIRE RESISTANCE TO MOST AVAILABLE ANTIBIOTICS. CRITICAL RESULT CALLED TO, READ BACK BY AND VERIFIED WITH: CHERYL SMITH AT 1610 09/19/15 DV    Report Status 09/21/2015 FINAL  Final   Organism ID, Bacteria ENTEROCOCCUS FAECALIS  Final      Susceptibility   Enterococcus faecalis - MIC*    AMPICILLIN <=2 SENSITIVE Sensitive     LINEZOLID 2 SENSITIVE Sensitive     CIPROFLOXACIN Value in next row Resistant      RESISTANT>=8    TETRACYCLINE Value in next row Resistant      RESISTANT>=16    VANCOMYCIN Value in next row Resistant      RESISTANT>=32    GENTAMICIN SYNERGY Value in  next row Resistant  RESISTANT>=32    * ENTEROCOCCUS FAECALIS  Culture, respiratory (NON-Expectorated)     Status: None   Collection Time: 09/16/15  3:50 PM  Result Value Ref Range Status   Specimen Description TRACHEAL ASPIRATE  Final   Special Requests Immunocompromised  Final   Gram Stain   Final    FEW WBC SEEN GOOD SPECIMEN - 80-90% WBCS RARE GRAM NEGATIVE RODS    Culture LIGHT GROWTH PSEUDOMONAS AERUGINOSA  Final   Report Status 09/19/2015 FINAL  Final   Organism ID, Bacteria PSEUDOMONAS AERUGINOSA  Final      Susceptibility   Pseudomonas aeruginosa - MIC*    CEFTAZIDIME 8 SENSITIVE Sensitive     CIPROFLOXACIN 2 INTERMEDIATE Intermediate     GENTAMICIN >=16 RESISTANT Resistant     IMIPENEM >=16 RESISTANT Resistant     PIP/TAZO Value in next row Sensitive      SENSITIVE32    CEFEPIME Value in next row Sensitive      SENSITIVE8    LEVOFLOXACIN Value in next row Resistant      RESISTANT>=8    * LIGHT GROWTH PSEUDOMONAS AERUGINOSA  Culture, expectorated sputum-assessment     Status: None   Collection Time: 09/22/15  2:09 PM  Result Value Ref Range Status   Specimen Description ENDOTRACHEAL  Final   Special Requests Normal  Final   Sputum evaluation THIS SPECIMEN IS ACCEPTABLE FOR SPUTUM CULTURE  Final   Report Status 09/24/2015 FINAL  Final  Culture, respiratory (NON-Expectorated)     Status: None   Collection Time: 09/22/15  2:09 PM  Result Value Ref Range Status   Specimen Description ENDOTRACHEAL  Final   Special Requests Normal Reflexed from [id number]  Final   Gram Stain   Final    FEW WBC SEEN FEW GRAM NEGATIVE RODS POOR SPECIMEN - LESS THAN 70% WBCS    Culture   Final    MODERATE GROWTH PSEUDOMONAS AERUGINOSA LIGHT GROWTH KLEBSIELLA PNEUMONIAE REFER TO SENSITIVITIES FROM PREVIOUS CULTURE FOR ORG 2    Report Status 10/01/2015 FINAL  Final   Organism ID, Bacteria PSEUDOMONAS AERUGINOSA  Final   Organism ID, Bacteria KLEBSIELLA PNEUMONIAE  Final       Susceptibility   Pseudomonas aeruginosa - MIC*    CEFTAZIDIME 4 SENSITIVE Sensitive     CIPROFLOXACIN 2 INTERMEDIATE Intermediate     GENTAMICIN >=16 RESISTANT Resistant     IMIPENEM >=16 RESISTANT Resistant     PIP/TAZO Value in next row Sensitive      SENSITIVE16    * MODERATE GROWTH PSEUDOMONAS AERUGINOSA  Tissue culture     Status: None   Collection Time: 09/24/15  6:44 AM  Result Value Ref Range Status   Specimen Description BONE  Final   Special Requests Normal  Final   Gram Stain MODERATE WBC SEEN FEW GRAM NEGATIVE RODS   Final   Culture   Final    MODERATE GROWTH ESCHERICHIA COLI MODERATE GROWTH KLEBSIELLA PNEUMONIAE ESBL-EXTENDED SPECTRUM BETA LACTAMASE-THE ORGANISM IS RESISTANT TO PENICILLINS, CEPHALOSPORINS AND AZTREONAM ACCORDING TO CLSI M100-S15 VOL.25 N01 JAN 2005. ORGANISM 1 This organism isolate is resistant to one or more antiotic agents in three or more antimicrobial categories.  Suggest Infectious Disease consult.   ORGANISM 2 CRITICAL RESULT CALLED TO, READ BACK BY AND VERIFIED WITH: RN Mardene Celeste LINDSAY 09/27/15 1005AM    Report Status 09/28/2015 FINAL  Final   Organism ID, Bacteria ESCHERICHIA COLI  Final   Organism ID, Bacteria KLEBSIELLA PNEUMONIAE  Final  Susceptibility   Escherichia coli - MIC*    AMPICILLIN >=32 RESISTANT Resistant     CEFTAZIDIME 4 RESISTANT Resistant     CEFAZOLIN >=64 RESISTANT Resistant     CEFTRIAXONE 8 RESISTANT Resistant     GENTAMICIN <=1 SENSITIVE Sensitive     IMIPENEM 8 RESISTANT Resistant     TRIMETH/SULFA <=20 SENSITIVE Sensitive     Extended ESBL POSITIVE Resistant     PIP/TAZO Value in next row Resistant      RESISTANT>=128    * MODERATE GROWTH ESCHERICHIA COLI   Klebsiella pneumoniae - MIC*    AMPICILLIN Value in next row Resistant      RESISTANT>=128    CEFTAZIDIME Value in next row Resistant      RESISTANT>=128    CEFAZOLIN Value in next row Resistant      RESISTANT>=128    CEFTRIAXONE Value in next row  Resistant      RESISTANT>=128    CIPROFLOXACIN Value in next row Resistant      RESISTANT>=128    GENTAMICIN Value in next row Resistant      RESISTANT>=128    IMIPENEM Value in next row Resistant      RESISTANT>=128    TRIMETH/SULFA Value in next row Sensitive      RESISTANT>=128    PIP/TAZO Value in next row Resistant      RESISTANT>=128    * MODERATE GROWTH KLEBSIELLA PNEUMONIAE  Anaerobic culture     Status: None   Collection Time: 09/24/15  3:55 PM  Result Value Ref Range Status   Specimen Description BONE  Final   Special Requests Normal  Final   Culture NO ANAEROBES ISOLATED  Final   Report Status 09/29/2015 FINAL  Final  Culture, fungus without smear     Status: None   Collection Time: 09/24/15  3:55 PM  Result Value Ref Range Status   Specimen Description BONE  Final   Special Requests Normal  Final   Culture NO FUNGUS ISOLATED AFTER 24 DAYS  Final   Report Status 10/18/2015 FINAL  Final  C difficile quick scan w PCR reflex     Status: None   Collection Time: 10/07/15  2:02 PM  Result Value Ref Range Status   C Diff antigen NEGATIVE NEGATIVE Final   C Diff toxin NEGATIVE NEGATIVE Final   C Diff interpretation Negative for C. difficile  Final  Culture, blood (routine x 2)     Status: None   Collection Time: 10/16/15  9:52 AM  Result Value Ref Range Status   Specimen Description BLOOD LEFT HAND  Final   Special Requests   Final    BOTTLES DRAWN AEROBIC AND ANAEROBIC  AER 6CC ANA 4CC   Culture NO GROWTH 6 DAYS  Final   Report Status 10/22/2015 FINAL  Final  Culture, blood (routine x 2)     Status: None   Collection Time: 10/16/15 12:25 PM  Result Value Ref Range Status   Specimen Description BLOOD LEFT HAND  Final   Special Requests BOTTLES DRAWN AEROBIC AND ANAEROBIC  5CC  Final   Culture NO GROWTH 6 DAYS  Final   Report Status 10/22/2015 FINAL  Final  Culture, expectorated sputum-assessment     Status: None   Collection Time: 10/18/15  1:15 PM  Result Value  Ref Range Status   Specimen Description SPUTUM  Final   Special Requests NONE  Final   Sputum evaluation THIS SPECIMEN IS ACCEPTABLE FOR SPUTUM CULTURE  Final  Report Status 10/18/2015 FINAL  Final  Culture, respiratory (NON-Expectorated)     Status: None   Collection Time: 10/18/15  1:15 PM  Result Value Ref Range Status   Specimen Description SPUTUM  Final   Special Requests NONE Reflexed from [id number]  Final   Gram Stain   Final    GOOD SPECIMEN - 80-90% WBCS MODERATE WBC SEEN MANY GRAM NEGATIVE RODS    Culture   Final    HEAVY GROWTH PSEUDOMONAS AERUGINOSA MODERATE GROWTH KLEBSIELLA PNEUMONIAE CRITICAL RESULT CALLED TO, READ BACK BY AND VERIFIED WITHRod Mae AT 1610 10/21/15 DV KLEBSIELLA PNEUMONIAE This organism isolate is resistant to one or more antiotic agents in three or more antimicrobial categories.  Suggest Infectious Disease  consult.      Report Status 10/22/2015 FINAL  Final   Organism ID, Bacteria PSEUDOMONAS AERUGINOSA  Final   Organism ID, Bacteria KLEBSIELLA PNEUMONIAE  Final      Susceptibility   Klebsiella pneumoniae - MIC*    AMPICILLIN >=32 RESISTANT Resistant     CEFAZOLIN >=64 RESISTANT Resistant     CEFTRIAXONE >=64 RESISTANT Resistant     CIPROFLOXACIN >=4 RESISTANT Resistant     GENTAMICIN >=16 RESISTANT Resistant     IMIPENEM >=16 RESISTANT Resistant     NITROFURANTOIN >=512 RESISTANT Resistant     TRIMETH/SULFA <=20 SENSITIVE Sensitive     * MODERATE GROWTH KLEBSIELLA PNEUMONIAE   Pseudomonas aeruginosa - MIC*    CEFTAZIDIME 4 SENSITIVE Sensitive     CIPROFLOXACIN >=4 RESISTANT Resistant     GENTAMICIN 8 INTERMEDIATE Intermediate     IMIPENEM >=16 RESISTANT Resistant     PIP/TAZO Value in next row Sensitive      SENSITIVE32    * HEAVY GROWTH PSEUDOMONAS AERUGINOSA  C difficile quick scan w PCR reflex     Status: None   Collection Time: 10/18/15  4:39 PM  Result Value Ref Range Status   C Diff antigen NEGATIVE NEGATIVE Final   C Diff  toxin NEGATIVE NEGATIVE Final   C Diff interpretation Negative for C. difficile  Final  Culture, blood (Routine X 2) w Reflex to ID Panel     Status: None   Collection Time: 11/06/15  8:27 AM  Result Value Ref Range Status   Specimen Description BLOOD RIGHT HAND  Final   Special Requests BOTTLES DRAWN AEROBIC AND ANAEROBIC 3CC  Final   Culture NO GROWTH 5 DAYS  Final   Report Status 11/11/2015 FINAL  Final  Culture, blood (Routine X 2) w Reflex to ID Panel     Status: None   Collection Time: 11/06/15  8:29 AM  Result Value Ref Range Status   Specimen Description BLOOD LEFT HAND  Final   Special Requests BOTTLES DRAWN AEROBIC AND ANAEROBIC  1CC  Final   Culture NO GROWTH 5 DAYS  Final   Report Status 11/11/2015 FINAL  Final  Culture, expectorated sputum-assessment     Status: None   Collection Time: 11/06/15  8:41 AM  Result Value Ref Range Status   Specimen Description EXPECTORATED SPUTUM  Final   Special Requests Immunocompromised  Final   Sputum evaluation THIS SPECIMEN IS ACCEPTABLE FOR SPUTUM CULTURE  Final   Report Status 11/06/2015 FINAL  Final  Culture, respiratory (NON-Expectorated)     Status: None (Preliminary result)   Collection Time: 11/06/15  8:41 AM  Result Value Ref Range Status   Specimen Description EXPECTORATED SPUTUM  Final   Special Requests Immunocompromised Reflexed from [id number]  Final   Gram Stain   Final    MANY WBC SEEN MANY GRAM NEGATIVE RODS EXCELLENT SPECIMEN - 90-100% WBCS    Culture   Final    MODERATE GROWTH KLEBSIELLA PNEUMONIAE MODERATE GROWTH PSEUDOMONAS AERUGINOSA CRITICAL RESULT CALLED TO, READ BACK BY AND VERIFIED WITH: DR. FITZGERALD AT 1120 11/09/15 DV DR. FITZGERALD REQUESTED ORGANISM BE SENT OUT FOR TIGECYCLINE AND COLISTIN Sent to [facility] for further susceptibility testing. 11/11/15 CTJ    Report Status PENDING  Incomplete   Organism ID, Bacteria KLEBSIELLA PNEUMONIAE  Final   Organism ID, Bacteria PSEUDOMONAS AERUGINOSA  Final       Susceptibility   Klebsiella pneumoniae - MIC*    AMPICILLIN >=32 RESISTANT Resistant     CEFAZOLIN >=64 RESISTANT Resistant     CEFTRIAXONE 32 RESISTANT Resistant     CIPROFLOXACIN >=4 RESISTANT Resistant     GENTAMICIN >=16 RESISTANT Resistant     IMIPENEM >=16 RESISTANT Resistant     NITROFURANTOIN 256 RESISTANT Resistant     TRIMETH/SULFA >=320 RESISTANT Resistant     Extended ESBL POSITIVE Resistant     * MODERATE GROWTH KLEBSIELLA PNEUMONIAE   Pseudomonas aeruginosa - MIC*    CEFTAZIDIME >=64 RESISTANT Resistant     CIPROFLOXACIN 2 INTERMEDIATE Intermediate     GENTAMICIN >=16 RESISTANT Resistant     IMIPENEM >=16 RESISTANT Resistant     * MODERATE GROWTH PSEUDOMONAS AERUGINOSA    Coagulation Studies: No results for input(s): LABPROT, INR in the last 72 hours.  Urinalysis: No results for input(s): COLORURINE, LABSPEC, PHURINE, GLUCOSEU, HGBUR, BILIRUBINUR, KETONESUR, PROTEINUR, UROBILINOGEN, NITRITE, LEUKOCYTESUR in the last 72 hours.  Invalid input(s): APPERANCEUR    Imaging: Dg Chest Port 1 View  11/12/2015  CLINICAL DATA:  Respiratory failure EXAM: PORTABLE CHEST 1 VIEW COMPARISON:  11/09/2015 FINDINGS: Tracheostomy in good position. Left jugular central venous catheter tip in the SVC. Right jugular dialysis catheter at the cavoatrial junction. No pneumothorax Mild improvement in bilateral airspace disease consistent with edema and bibasilar atelectasis. Small bilateral effusions. IMPRESSION: Slight improvement in bilateral airspace disease consistent edema. Improvement in bibasilar atelectasis. Electronically Signed   By: Marlan Palau M.D.   On: 11/12/2015 09:03     Medications:   . dextrose 5 % and 0.9% NaCl 10 mL/hr at 11/12/15 1900  . feeding supplement (JEVITY 1.5 CAL/FIBER) 1,000 mL (11/12/15 1458)  . norepinephrine Stopped (11/12/15 0751)   . amiodarone  400 mg Per Tube BID  . antiseptic oral rinse  7 mL Mouth Rinse QID  . aspirin  81 mg Oral Daily  .  chlorhexidine gluconate  15 mL Mouth Rinse BID  . epoetin (EPOGEN/PROCRIT) injection  10,000 Units Intravenous Q M,W,F-HD  . escitalopram  10 mg Oral Daily  . feeding supplement (PRO-STAT SUGAR FREE 64)  30 mL Oral 6 X Daily  . free water  20 mL Per Tube 6 times per day  . heparin subcutaneous  5,000 Units Subcutaneous Q12H  . hydrocortisone  10 mg Per Tube BID  . lidocaine  1 patch Transdermal Q24H  . midodrine  10 mg Per Tube TID WC  . mirtazapine  7.5 mg Oral QHS  . multivitamin  5 mL Oral Daily  . pantoprazole sodium  40 mg Per Tube Daily  . saccharomyces boulardii  250 mg Oral BID  . sodium chloride  10-40 mL Intracatheter Q12H  . sodium hypochlorite   Irrigation BID   acetaminophen (TYLENOL) oral liquid 160 mg/5 mL, HYDROmorphone (DILAUDID) injection, ipratropium-albuterol, loperamide,  morphine injection, ondansetron (ZOFRAN) IV, oxyCODONE, polyvinyl alcohol, sodium chloride, zinc oxide  Assessment/ Plan:  49 y.o. 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 Admitted on 09/08/15  Echo: 11/07/19: moderately to severely reduced. The estimated ejection fraction was in the range of 30% to 35%. Regional wall motion abnormalities: Severe hypokinesis of the anterior and anteroseptal myocardium.  1. End-stage renal disease on hemodialysis on HD MWF. The patient has been on dialysis since October of 2014. R IJ permcath.  Dialysis treatment with IV albumin for oncotic support. Midodrine and hydrocortisone ordered for blood pressure support.  - Continue MWF schedule.     - Did not do well with dialysis this week - will try longer treatments with slower blood flows ~ 250 - 300  2. Anemia of CKD. Hemoglobin 7.6 - Continue scheduled Epogen with dialysis.  -  1 unit PRBC  Given 12/18  3. Acute resp failure:  -Patient remains ventilator dependent at this point in time.   4. Sepsis: VRE in blood. Bone cultures E. Coli and klebsiella  - Treatment as per ID and ICU team:     5. Generalized edema/anasarca Patient is not able to tolerate fluid removal with dialysis      LOS: [address] 12/25/20168:18 AM

## 2015-11-13 NOTE — Progress Notes (Signed)
PULMONARY / CRITICAL CARE MEDICINE   Name: Courtney [name] MRN: [id number] DOB: 1966-11-02    ADMISSION DATE:  2015/08/16  BRIEF HISTORY: 49 AAF who has been in medical facilities (hosp, LTAC, rehab) for 2 yrs following gastric bypass surgery with multiple complications. Now with chronic trach, ESRD, profound debilitation, severe sacral pressure ulcer. Was seemingly making progress and transferred to rehab facility approx one week prior to this admission. She was sent to [facility] ED with AMS and hypotension. Working dx of severe sepsis/septic shock due to infected sacral pressure ulcer. Since admission. Her course is been very complicated with numerous complications including septic shock and GI bleeding. Now with failure to wean from vent and possible MI event, repeat ECHO EF 30-35, possible old MI, severe hypokinesis of the nnterior and anteroseptal myocardium   INTERVAL HISTORY: Increased mucopurulent secretions. No distress  Summary of MAJOR EVENTS/TEST RESULTS: Admission 02/07/14-05/07/14 Admission 07/21/14-09/06/14 Discharged to [facility]. Pt had palliative consult at that time, were asked to sign off by husband.  09/23 CT head: NAD 09/23 EEG: no epileptiform activity 09/23 PRBCs for Hgb 6.4 09/24 bedside debridement of sacral wound. Abscess drained 09/25 Off vasopressors. More alert. No distress. Worsening thrombocytopenia. Vanc DC'd 09/29 Dr. Sampson Goon (I.D) excused from the case by patient's husband. 10/02 MRI -multiple infarcts 10/03 tracheal bleeding- transfused platelets 10/03 hospitalist service excused from the case by patient's husband 10/03 Echocardiogram ejection fraction was 55-60%, pulmonary systolic pressure was 39 mmHg 40/98 restart TF's at lower rate, attempt reg HD  10/12 Transferred to med-surg floor. Remains on PCCM service 10/14 SLP eval: pt unable to tolerate PMV adequately 10/17-will re-attempt PM valve-discussed with Speech therapist 10/18 passed swallow  eval-start pureed thick foods no thin liquids-continue NG feeds 10/19 transferred to step down for sepsis/aspiration pneumonia 10/19 cxr shows RLL opacity 10/21 sacral decub debride at bedside by surgery 10/22 started back on vasopressors while on HD 10/27 CT with osteo, R hip fx, unable to identify tip of dubhoff tube - sent for fluoro study 10/28 Ortho consultation: I do not feel that she is a surgical candidate. Therefore, I feel that it would best to manage this fracture nonsurgically and allow it to heal by itself over time, which it should.  10/28 Gen Surg consultation: Due to the lack of free air or free flow of contrast and the peritoneum there is no indication for any surgical intervention on this. Would recommend pulling back the feeding tube 1-2 cm. Would recheck an abdominal film to confirm no pneumoperitoneum in the morning. Absence of any changes okay to continue using Dobbhoff for feeding and medications. 10/28 gastrograffin study: The study confirms that the feeding tube pes perforated through the duodenum and the tip is within a cavity that fills with injected contrast. The cavity does appear walled off 11/4 refuses oral feeds, continue TF's CT reviewed: T8-T9 discitis/osteomyelitis, RT HIP FRACTURE 11/5 placed back on vasopressors, placed back on Vent due to resp acidosis, levophed turned off 11/6 afternoon 11/5 PM Trach changed out due to cuff leak, #6 Shiley cuffed, 11/6 dubhoff occluded, removed and replaced, new dubhoff shows tube in the antral stomach 11/15 refusing to take oral feeds 11/18 placed back on Vent for increased WOB,SOB. 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 10/18/2015; the patient developed a new left basal pneumonia; with recurrent sepsis.  10/19/2015; fevers resolved with IV acetaminophen. Tube feeds changed from vital to Jevity.  12/7 CXR with stable b/l opacities.  12/12  elevated K s/p HD-remains on AC mode 12/16-vomiting-hold feeds and re-assess in next 24 hrs 12/17- TF restarted at 1/2 goal (goal = 50cc/per) 12/18- elevated wbc, fever (101.4), recultured, restarted on ceftaz 12/19 Pericardial friction rub. Echocardiogram: LVEF 30-35%, anteroseptal hypokinesis or akinesis c/w anterior wall MI 12/20 Dr Sung AmabileSimonds discussed new echocardiogram findings with pt's husband and explained that this likely represents an anterior wall MI sometime in the previous couple of weeks. It was explained that she is not a candidate for any cardiology intervention. Code status was again discussed. Husband expressed understanding that she was not going to survive this illness in any favorable way but continues to request that she be full code status based on what he believes to be her wishes 12/21 vomitied 50cc of TF - AM TF held, restart TF at 3pm @30cc   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 Pneumoniae, P. Mirabilis, VRE Wound 9/26 >> No growth Blood 9/23 >> NEG CDiff 9/27>>neg Stool Cx 10/15>> negative Cdiff 10/25>>neg Trach Aspirate 10/28>> light growth pseudomonas Blood 10/28 >> 1/2 GPC >>  Sputum cultures obtained 09/22/15 due to mucus plugging>>Pseudomonas BONE TISSUE Cx 11/3>>E.coli (ESBL), k. Pneumonia (daptomycin and septra) Bld Cx 11/27>>negative thus far.  Trach Asp 11/27>> grew out Pseudomonas, and Klebsiella pneumonia. Trach Asp 12/18>>PAN RESISTANT Klebsiella, Pseudomonas Bld Cx 12/18>> NEG  ANTIMICROBIALS:  Aztreonam 9/23 >> 9/24 Vanc 9/23 >> 9/25 Vanc 9/26>>9/27 Daptomycin 9/27>> 10/12, 10/28>>  off Meropenem 9/23 >> 10/14, 10/28 >> 10/31 Septra 11/8>> Zosyn 11/27,>> 11/30. Levaquin 11/29>> 11/29. Ceftaz 12/1>>12/11, 12/18>> 12/24 Bactrim - chronic  VITAL SIGNS: Temp:  [98.8 F (37.1 C)-100.1 F (37.8 C)] 100.1 F (37.8 C) (12/25 1050) Pulse Rate:  [71-82] 75 (12/25 1100) Resp:  [8-27] 24 (12/25 1100) BP: (118-146)/(46-85) 146/72 mmHg (12/25 1100) SpO2:  [97 %-100 %] 97 % (12/25 1100) FiO2 (%):  [40 %] 40 % (12/25 0800) HEMODYNAMICS:   VENTILATOR SETTINGS: Vent Mode:  [-] PRVC FiO2 (%):  [40 %] 40 % Set Rate:  [14 bmp] 14 bmp Vt Set:  [530 mL] 530 mL PEEP:  [5 cmH20] 5 cmH20 INTAKE / OUTPUT:  Intake/Output Summary (Last 24 hours) at 11/13/15 1603 Last data filed at 11/13/15 1430  Gross per 24 hour  Intake   1855 ml  Output      0 ml  Net   1855 ml    Review of Systems  Unable to perform ROS  Unable to fully obtain, due to trach and critical illness Physical Exam  Vent dependent Flat affect HEENT WNL, NGT present Trach site clean Chest clear anteriorly Regular rhythm, rate normal, could not appreciate friction rub today Abdomen soft, + BS Ext cool, trace symmetric edema Severe sacral decubitus ulcer - site looks clean 12/21 Profoundly weak with muscle wasting  LABS:  CBC  Recent Labs Lab 11/09/15 1307 11/10/15 0930 11/11/15 0558  WBC 17.7* 23.1* 19.3*  HGB 7.6* 7.9* 7.6*  HCT 25.9* 26.5* 25.1*  PLT 216 230 217   Coag's No results for input(s): APTT, INR in the last 168 hours. BMET  Recent Labs Lab 11/09/15 1534 11/10/15 [id number] 11/11/15 [id number]  NA 143 138 143  K 3.4* 3.6 3.9  CL 100* 96* 104  CO2 33* 31 33*  BUN 39* 47* 61*  CREATININE 0.85 1.23* 1.49*  GLUCOSE 92 279* 153*   Electrolytes  Recent Labs Lab 11/07/15 1810 11/09/15 1307 11/09/15 1534 11/10/15 0933 11/11/15 0558  CALCIUM 8.7* 8.5* 8.4* 8.1* 8.5*  MG  --   --  1.8  --   --   PHOS 4.7* 3.3 2.4*  --   --    Sepsis Markers No results for input(s):  LATICACIDVEN, PROCALCITON, O2SATVEN in the last 168 hours. ABG No results for input(s): PHART, PCO2ART, PO2ART in the last 168 hours. Liver Enzymes  Recent Labs Lab 11/07/15 1810 11/09/15 1307 11/09/15 1534  AST  --   --  68*  ALT  --   --  43  ALKPHOS  --   --  1677*  BILITOT  --   --  1.2  ALBUMIN 1.9* 1.8* 2.0*   Cardiac Enzymes  Recent Labs Lab 11/09/15 1534 11/10/15 0125 11/10/15 0930  TROPONINI 0.11* 0.08* 0.28*   Glucose  Recent Labs Lab 11/11/15 1956 11/11/15 2333 11/12/15 0749 11/12/15 1117 11/13/15 0011 11/13/15 0733  GLUCAP 124* 119* 164* 161* 152* 171*    Imaging No results found.  ASSESSMENT / PLAN: IMPRESSION: Very prolonged and complicated hospitalization after gastric bypass surgery 2014 Never returned home ESRD Recurrent severe sepsis - PAN resistant Klebisella in sputum Recurrent PNA H/O C diff H/O VTE DM2 - controlled Episodic hypoglycemia Chronic steroid therapy, for a history of of adrenal insufficiency Incidental finding of R hip fx - conservative mgmt. 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 Flutter   Billy Fischer, MD PCCM  service Mobile [phone] Pager [phone]

## 2015-11-13 NOTE — Progress Notes (Signed)
MEDICATION RELATED CONSULT NOTE - Follow up   Pharmacy Consult for Renal dosing  Allergies  Allergen Reactions  . Contrast Media [Iodinated Diagnostic Agents] Anaphylaxis  . Ampicillin Rash    Patient Measurements: Ht 245ft 9in Height:  (SCALE BROKE) Weight:  (UTA d/t scale broken) IBW/kg (Calculated) : 66.2  Vital Signs: Temp: 99.8 F (37.7 C) (12/25 0800) Temp Source: Axillary (12/25 0800) BP: 138/70 mmHg (12/25 0800) Pulse Rate: 73 (12/25 0800)  Recent Labs  11/11/15 0558  WBC 19.3*  HGB 7.6*  HCT 25.1*  PLT 217  CREATININE 1.49*   Estimated Creatinine Clearance: 51.3 mL/min (by C-G formula based on Cr of 1.49).   Assessment: 49 yo patient on HD. Pharmacy consulted for renal dosing of medications.   Plan:  No medications require adjustment at present. Pharmacy will continue to monitor and dose adjust as needed.    Luisa HartScott Jasminemarie Sherrard, PharmD  Clinical Pharmacist 11/13/2015

## 2015-11-13 NOTE — Plan of Care (Signed)
Problem: Consults Goal: Respiratory Problems Patient Education See Patient Education Module for education specifics.  Outcome: Not Progressing Pt still on Vent. Increased thickened secretions.   Problem: Phase I Progression Outcomes Goal: Flu/PneumoVaccines if indicated Outcome: Not Met (add Reason) [name] stated he did not want pt to have vaccine at this time. Goal: Tolerating diet Outcome: Not Progressing Tube feedings held due to increased secretions colored the same as Jevity 1.5.  Problem: Phase II Progression Outcomes Goal: ADLs completed with minimal assistance Outcome: Not Progressing Pt still total care

## 2015-11-13 NOTE — Progress Notes (Signed)
Throughout shift, pt has had continuous thick dark brown light green secretions that has been suctioned multiple times via inline suctioning of trach. During dressing change, pt needed suctioning. Light brown secretions noted. Tube feeding held. Color of secretions matched color of Jevity 1.5. Paged on call CCM. Spoke with Dr. Sung AmabileSimonds in regards to secretions. He stated to hold tube feeding for now until he rounds on the patient tomorrow.

## 2015-11-13 NOTE — Progress Notes (Signed)
   11/13/15 1400  Clinical Encounter Type  Visited With Patient and family together;Health care provider  Visit Type Follow-up  Consult/Referral To Chaplain  Spiritual Encounters  Spiritual Needs Emotional  Stress Factors  Patient Stress Factors Not reviewed  Family Stress Factors Not reviewed  Chaplain rounded in the unit and offered a compassionate presence and support. Chaplain Ilaisaane Marts A. Danylah Holden Ext. [phone]

## 2015-11-14 LAB — RENAL FUNCTION PANEL
ALBUMIN: 1.6 g/dL — AB (ref 3.5–5.0)
ANION GAP: 10 (ref 5–15)
BUN: 87 mg/dL — ABNORMAL HIGH (ref 6–20)
CHLORIDE: 100 mmol/L — AB (ref 101–111)
CO2: 31 mmol/L (ref 22–32)
Calcium: 8.5 mg/dL — ABNORMAL LOW (ref 8.9–10.3)
Creatinine, Ser: 1.71 mg/dL — ABNORMAL HIGH (ref 0.44–1.00)
GFR calc Af Amer: 39 mL/min — ABNORMAL LOW (ref >60)
GFR, EST NON AFRICAN AMERICAN: 34 mL/min — AB (ref >60)
Glucose, Bld: 87 mg/dL (ref 65–99)
PHOSPHORUS: 4.7 mg/dL — AB (ref 2.5–4.6)
POTASSIUM: 4.8 mmol/L (ref 3.5–5.1)
Sodium: 141 mmol/L (ref 135–145)

## 2015-11-14 LAB — CBC
HCT: 24.3 % — ABNORMAL LOW (ref 35.0–47.0)
HEMATOCRIT: 24.1 % — AB (ref 35.0–47.0)
HEMOGLOBIN: 7.3 g/dL — AB (ref 12.0–16.0)
HEMOGLOBIN: 7.2 g/dL — AB (ref 12.0–16.0)
MCH: 28.5 pg (ref 26.0–34.0)
MCH: 28.1 pg (ref 26.0–34.0)
MCHC: 30.2 g/dL — ABNORMAL LOW (ref 32.0–36.0)
MCHC: 29.6 g/dL — ABNORMAL LOW (ref 32.0–36.0)
MCV: 94.3 fL (ref 80.0–100.0)
MCV: 94.9 fL (ref 80.0–100.0)
Platelets: 238 10*3/uL (ref 150–440)
Platelets: 242 10*3/uL (ref 150–440)
RBC: 2.57 MIL/uL — AB (ref 3.80–5.20)
RBC: 2.54 MIL/uL — ABNORMAL LOW (ref 3.80–5.20)
RDW: 18.4 % — AB (ref 11.5–14.5)
RDW: 18.7 % — ABNORMAL HIGH (ref 11.5–14.5)
WBC: 19.9 10*3/uL — ABNORMAL HIGH (ref 3.6–11.0)
WBC: 19.1 10*3/uL — AB (ref 3.6–11.0)

## 2015-11-14 LAB — GLUCOSE, CAPILLARY
GLUCOSE-CAPILLARY: 72 mg/dL (ref 65–99)
GLUCOSE-CAPILLARY: 68 mg/dL (ref 65–99)
Glucose-Capillary: 78 mg/dL (ref 65–99)
Glucose-Capillary: 76 mg/dL (ref 65–99)
Glucose-Capillary: 62 mg/dL — ABNORMAL LOW (ref 65–99)
Glucose-Capillary: 135 mg/dL — ABNORMAL HIGH (ref 65–99)

## 2015-11-14 MED ORDER — TIGECYCLINE 50 MG IV SOLR
50.0000 mg | Freq: Two times a day (BID) | INTRAVENOUS | Status: DC
  Administered 2015-11-15 – 2015-12-06 (×43): 50 mg via INTRAVENOUS
  Filled 2015-11-14 (×46): qty 100

## 2015-11-14 MED ORDER — SODIUM CHLORIDE 0.9 % IV SOLN
200.0000 mg | INTRAVENOUS | Status: DC
  Administered 2015-11-15 – 2015-11-16 (×2): 200 mg via INTRAVENOUS
  Filled 2015-11-14 (×5): qty 2.67

## 2015-11-14 MED ORDER — TIGECYCLINE 50 MG IV SOLR
100.0000 mg | Freq: Once | INTRAVENOUS | Status: AC
  Administered 2015-11-14: 100 mg via INTRAVENOUS
  Filled 2015-11-14: qty 100

## 2015-11-14 MED ORDER — DEXTROSE 50 % IV SOLN
50.0000 mL | Freq: Once | INTRAVENOUS | Status: AC
  Administered 2015-11-14: 50 mL via INTRAVENOUS
  Filled 2015-11-14: qty 50

## 2015-11-14 NOTE — Progress Notes (Signed)
Dr. Sung AmabileSimonds notified of heart rhythm ventricular trigeminy. No new orders received.

## 2015-11-14 NOTE — Progress Notes (Signed)
HD tx start 

## 2015-11-14 NOTE — Progress Notes (Signed)
ANTIBIOTIC CONSULT NOTE - INITIAL  Pharmacy Consult for Colistin and Tigecycline  Indication: MDR Respiratory Infection   Allergies  Allergen Reactions  . Contrast Media [Iodinated Diagnostic Agents] Anaphylaxis  . Ampicillin Rash   Patient Measurements: Height:  (SCALE BROKE) Weight:  (bed scale is broken) IBW/kg (Calculated) : 66.2  Vital Signs: Temp: 99.1 F (37.3 C) (12/26 0800) Temp Source: Oral (12/26 0800) BP: 134/79 mmHg (12/26 1300) Pulse Rate: 70 (12/26 0945) Intake/Output from previous day: 12/25 0701 - 12/26 0700 In: 915 [I.V.:240; NG/GT:675] Out: -  Intake/Output from this shift: Total I/O In: 90 [I.V.:70; NG/GT:20] Out: -   Recent Labs  11/14/15 0512 11/14/15 0855  WBC 19.9* 19.1*  HGB 7.3* 7.2*  PLT 238 242  CREATININE  --  1.71*   Estimated Creatinine Clearance: 44.7 mL/min (by C-G formula based on Cr of 1.71). No results for input(s): VANCOTROUGH, VANCOPEAK, VANCORANDOM, GENTTROUGH, GENTPEAK, GENTRANDOM, TOBRATROUGH, TOBRAPEAK, TOBRARND, AMIKACINPEAK, AMIKACINTROU, AMIKACIN in the last 72 hours.   Microbiology: Recent Results (from the past 720 hour(s))  Culture, blood (routine x 2)     Status: None   Collection Time: 10/16/15  9:52 AM  Result Value Ref Range Status   Specimen Description BLOOD LEFT HAND  Final   Special Requests   Final    BOTTLES DRAWN AEROBIC AND ANAEROBIC  AER 6CC ANA 4CC   Culture NO GROWTH 6 DAYS  Final   Report Status 10/22/2015 FINAL  Final  Culture, blood (routine x 2)     Status: None   Collection Time: 10/16/15 12:25 PM  Result Value Ref Range Status   Specimen Description BLOOD LEFT HAND  Final   Special Requests BOTTLES DRAWN AEROBIC AND ANAEROBIC  5CC  Final   Culture NO GROWTH 6 DAYS  Final   Report Status 10/22/2015 FINAL  Final  Culture, expectorated sputum-assessment     Status: None   Collection Time: 10/18/15  1:15 PM  Result Value Ref Range Status   Specimen Description SPUTUM  Final   Special  Requests NONE  Final   Sputum evaluation THIS SPECIMEN IS ACCEPTABLE FOR SPUTUM CULTURE  Final   Report Status 10/18/2015 FINAL  Final  Culture, respiratory (NON-Expectorated)     Status: None   Collection Time: 10/18/15  1:15 PM  Result Value Ref Range Status   Specimen Description SPUTUM  Final   Special Requests NONE Reflexed from [id number]  Final   Gram Stain   Final    GOOD SPECIMEN - 80-90% WBCS MODERATE WBC SEEN MANY GRAM NEGATIVE RODS    Culture   Final    HEAVY GROWTH PSEUDOMONAS AERUGINOSA MODERATE GROWTH KLEBSIELLA PNEUMONIAE CRITICAL RESULT CALLED TO, READ BACK BY AND VERIFIED WITHRod Mae AT 1610 10/21/15 DV KLEBSIELLA PNEUMONIAE This organism isolate is resistant to one or more antiotic agents in three or more antimicrobial categories.  Suggest Infectious Disease  consult.      Report Status 10/22/2015 FINAL  Final   Organism ID, Bacteria PSEUDOMONAS AERUGINOSA  Final   Organism ID, Bacteria KLEBSIELLA PNEUMONIAE  Final      Susceptibility   Klebsiella pneumoniae - MIC*    AMPICILLIN >=32 RESISTANT Resistant     CEFAZOLIN >=64 RESISTANT Resistant     CEFTRIAXONE >=64 RESISTANT Resistant     CIPROFLOXACIN >=4 RESISTANT Resistant     GENTAMICIN >=16 RESISTANT Resistant     IMIPENEM >=16 RESISTANT Resistant     NITROFURANTOIN >=512 RESISTANT Resistant     TRIMETH/SULFA <=20 SENSITIVE  Sensitive     * MODERATE GROWTH KLEBSIELLA PNEUMONIAE   Pseudomonas aeruginosa - MIC*    CEFTAZIDIME 4 SENSITIVE Sensitive     CIPROFLOXACIN >=4 RESISTANT Resistant     GENTAMICIN 8 INTERMEDIATE Intermediate     IMIPENEM >=16 RESISTANT Resistant     PIP/TAZO Value in next row Sensitive      SENSITIVE32    * HEAVY GROWTH PSEUDOMONAS AERUGINOSA  C difficile quick scan w PCR reflex     Status: None   Collection Time: 10/18/15  4:39 PM  Result Value Ref Range Status   C Diff antigen NEGATIVE NEGATIVE Final   C Diff toxin NEGATIVE NEGATIVE Final   C Diff interpretation Negative for  C. difficile  Final  Culture, blood (Routine X 2) w Reflex to ID Panel     Status: None   Collection Time: 11/06/15  8:27 AM  Result Value Ref Range Status   Specimen Description BLOOD RIGHT HAND  Final   Special Requests BOTTLES DRAWN AEROBIC AND ANAEROBIC 3CC  Final   Culture NO GROWTH 5 DAYS  Final   Report Status 11/11/2015 FINAL  Final  Culture, blood (Routine X 2) w Reflex to ID Panel     Status: None   Collection Time: 11/06/15  8:29 AM  Result Value Ref Range Status   Specimen Description BLOOD LEFT HAND  Final   Special Requests BOTTLES DRAWN AEROBIC AND ANAEROBIC  1CC  Final   Culture NO GROWTH 5 DAYS  Final   Report Status 11/11/2015 FINAL  Final  Culture, expectorated sputum-assessment     Status: None   Collection Time: 11/06/15  8:41 AM  Result Value Ref Range Status   Specimen Description EXPECTORATED SPUTUM  Final   Special Requests Immunocompromised  Final   Sputum evaluation THIS SPECIMEN IS ACCEPTABLE FOR SPUTUM CULTURE  Final   Report Status 11/06/2015 FINAL  Final  Culture, respiratory (NON-Expectorated)     Status: None (Preliminary result)   Collection Time: 11/06/15  8:41 AM  Result Value Ref Range Status   Specimen Description EXPECTORATED SPUTUM  Final   Special Requests Immunocompromised Reflexed from [id number]  Final   Gram Stain   Final    MANY WBC SEEN MANY GRAM NEGATIVE RODS EXCELLENT SPECIMEN - 90-100% WBCS    Culture   Final    MODERATE GROWTH KLEBSIELLA PNEUMONIAE MODERATE GROWTH PSEUDOMONAS AERUGINOSA CRITICAL RESULT CALLED TO, READ BACK BY AND VERIFIED WITH: DR. FITZGERALD AT 1120 11/09/15 DV DR. FITZGERALD REQUESTED ORGANISM BE SENT OUT FOR TIGECYCLINE AND COLISTIN Sent to [facility] for further susceptibility testing. 11/11/15 CTJ    Report Status PENDING  Incomplete   Organism ID, Bacteria KLEBSIELLA PNEUMONIAE  Final   Organism ID, Bacteria PSEUDOMONAS AERUGINOSA  Final      Susceptibility   Klebsiella pneumoniae - MIC*    AMPICILLIN >=32  RESISTANT Resistant     CEFAZOLIN >=64 RESISTANT Resistant     CEFTRIAXONE 32 RESISTANT Resistant     CIPROFLOXACIN >=4 RESISTANT Resistant     GENTAMICIN >=16 RESISTANT Resistant     IMIPENEM >=16 RESISTANT Resistant     NITROFURANTOIN 256 RESISTANT Resistant     TRIMETH/SULFA >=320 RESISTANT Resistant     Extended ESBL POSITIVE Resistant     * MODERATE GROWTH KLEBSIELLA PNEUMONIAE   Pseudomonas aeruginosa - MIC*    CEFTAZIDIME >=64 RESISTANT Resistant     CIPROFLOXACIN 2 INTERMEDIATE Intermediate     GENTAMICIN >=16 RESISTANT Resistant     IMIPENEM >=16  RESISTANT Resistant     * MODERATE GROWTH PSEUDOMONAS AERUGINOSA    Medical History: Past Medical History  Diagnosis Date  . Obesity   . Dyslipidemia   . Hypertension   . Coronary artery disease     s/p BMS 2010 LAD  . Dysrhythmia     ventricular tachycardia resolved after LAD stent and beta blocker  . Diabetes mellitus     with retinopathy, neuropathy and microalbuminemia  . ESRD (end stage renal disease) on dialysis    Assessment: 49 yo female with chronic trach, ESRD, and  profound debilitation, needing treatment for MDR Respiratory infection. Patient is on day 95 of hospital stay, and has been treated for multiple infections during this time.   11/06/15: Respiratory cultures showing Pan-resistant  Klebsiella pneumoniae and Pseudomonas aeruginosa  Sputum has been sent to [facility] for susceptibility testing per Dr. Jarrett Ables request.   Patient has increased respiratory secretions, leukocytosis and low grade fever   Plan:  Will start patient on Tigecycline  IV LD today, then begin tigecycline  IV q12h.   Patient receives HD MWF. Based on HD dosing of 2.5mg /kg q48 hrs, will start patient on Colistimethate  IV q48 hours. Will schedule dose for the evening, so that it follows her HD on MWF.   Patient should be monitored for nephrotoxicity and neurotoxicity.  Pharmacy to continue to follow per consult.       Cher Nakai, PharmD Pharmacy Resident  11/14/2015,3:03 PM

## 2015-11-14 NOTE — Progress Notes (Addendum)
PULMONARY / CRITICAL CARE MEDICINE   Name: Courtney [name] MRN: [id number] DOB: 1966/05/08    ADMISSION DATE:  09-07-15  BRIEF HISTORY: 49 AAF who has been in medical facilities (hosp, LTAC, rehab) for 2 yrs following gastric bypass surgery with multiple complications. Now with chronic trach, ESRD, profound debilitation, severe sacral pressure ulcer. Was seemingly making progress and transferred to rehab facility approx one week prior to this admission. She was sent to [facility] ED with AMS and hypotension. Working dx of severe sepsis/septic shock due to infected sacral pressure ulcer. Since admission. Her course is been very complicated with numerous complications including septic shock and GI bleeding. Now with failure to wean from vent and possible MI event, repeat ECHO EF 30-35, possible old MI, severe hypokinesis of the nnterior and anteroseptal myocardium   INTERVAL HISTORY: Increased mucopurulent secretions. No distress  Summary of MAJOR EVENTS/TEST RESULTS: Admission 02/07/14-05/07/14 Admission 07/21/14-09/06/14 Discharged to [facility]. Pt had palliative consult at that time, were asked to sign off by husband.  09/23 CT head: NAD 09/23 EEG: no epileptiform activity 09/23 PRBCs for Hgb 6.4 09/24 bedside debridement of sacral wound. Abscess drained 09/25 Off vasopressors. More alert. No distress. Worsening thrombocytopenia. Vanc DC'd 09/29 Dr. Sampson Goon (I.D) excused from the case by patient's husband. 10/02 MRI -multiple infarcts 10/03 tracheal bleeding- transfused platelets 10/03 hospitalist service excused from the case by patient's husband 10/03 Echocardiogram ejection fraction was 55-60%, pulmonary systolic pressure was 39 mmHg 40/98 restart TF's at lower rate, attempt reg HD  10/12 Transferred to med-surg floor. Remains on PCCM service 10/14 SLP eval: pt unable to tolerate PMV adequately 10/17-will re-attempt PM valve-discussed with Speech therapist 10/18 passed swallow  eval-start pureed thick foods no thin liquids-continue NG feeds 10/19 transferred to step down for sepsis/aspiration pneumonia 10/19 cxr shows RLL opacity 10/21 sacral decub debride at bedside by surgery 10/22 started back on vasopressors while on HD 10/27 CT with osteo, R hip fx, unable to identify tip of dubhoff tube - sent for fluoro study 10/28 Ortho consultation: I do not feel that she is a surgical candidate. Therefore, I feel that it would best to manage this fracture nonsurgically and allow it to heal by itself over time, which it should.  10/28 Gen Surg consultation: Due to the lack of free air or free flow of contrast and the peritoneum there is no indication for any surgical intervention on this. Would recommend pulling back the feeding tube 1-2 cm. Would recheck an abdominal film to confirm no pneumoperitoneum in the morning. Absence of any changes okay to continue using Dobbhoff for feeding and medications. 10/28 gastrograffin study: The study confirms that the feeding tube pes perforated through the duodenum and the tip is within a cavity that fills with injected contrast. The cavity does appear walled off 11/4 refuses oral feeds, continue TF's CT reviewed: T8-T9 discitis/osteomyelitis, RT HIP FRACTURE 11/5 placed back on vasopressors, placed back on Vent due to resp acidosis, levophed turned off 11/6 afternoon 11/5 PM Trach changed out due to cuff leak, #6 Shiley cuffed, 11/6 dubhoff occluded, removed and replaced, new dubhoff shows tube in the antral stomach 11/15 refusing to take oral feeds 11/18 placed back on Vent for increased WOB,SOB. 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 10/18/2015; the patient developed a new left basal pneumonia; with recurrent sepsis.  10/19/2015; fevers resolved with IV acetaminophen. Tube feeds changed from vital to Jevity.  12/7 CXR with stable b/l opacities.  12/12  elevated K s/p HD-remains on AC mode 12/16-vomiting-hold feeds and re-assess in next 24 hrs 12/17- TF restarted at 1/2 goal (goal = 50cc/per) 12/18- elevated wbc, fever (101.4), recultured, restarted on ceftaz 12/19 Pericardial friction rub. Echocardiogram: LVEF 30-35%, anteroseptal hypokinesis or akinesis c/w anterior wall MI 12/20 Dr Sung Amabile discussed new echocardiogram findings with pt's husband and explained that this likely represents an anterior wall MI sometime in the previous couple of weeks. It was explained that she is not a candidate for any cardiology intervention. Code status was again discussed. Husband expressed understanding that she was not going to survive this illness in any favorable way but continues to request that she be full code status based on what he believes to be her wishes 12/21 vomitied 50cc of TF - AM TF held, restart TF at 3pm  12/26 Persistent intermittent trach cuff leak. Distal XLT trach requested.   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 Pneumoniae, P. Mirabilis, VRE Wound 9/26 >> No growth Blood 9/23 >> NEG CDiff 9/27>>neg Stool Cx 10/15>> negative Cdiff 10/25>>neg Trach Aspirate 10/28>> light growth pseudomonas Blood 10/28 >> 1/2 GPC >>  Sputum cultures obtained 09/22/15 due to mucus plugging>>Pseudomonas BONE TISSUE Cx 11/3>>E.coli (ESBL), k. Pneumonia (daptomycin and septra) Bld Cx 11/27>>negative thus far.  Trach Asp 11/27>> grew out Pseudomonas, and Klebsiella pneumonia. Trach Asp 12/18>>PAN RESISTANT Klebsiella, Pseudomonas Bld Cx 12/18>> NEG 12/26 resp culture >>    ANTIMICROBIALS:  Aztreonam 9/23 >> 9/24 Vanc 9/23 >> 9/25 Vanc 9/26>>9/27 Daptomycin 9/27>> 10/12, 10/28>> off Meropenem 9/23 >> 10/14, 10/28 >> 10/31 Zosyn 11/27,>> 11/30. Levaquin 11/29>> 11/29. Ceftaz 12/1>>12/11, 12/18>> 12/24 Colisitin 12/26 >>  Tigecycline 12/26 >>  Septra 11/8>> chronic   VITAL SIGNS: Temp:  [98.5 F (36.9 C)-100.3 F (37.9 C)] 99.1 F (37.3 C) (12/26 0800) Pulse Rate:  [30-76] 70 (12/26 0945) Resp:  [0-26] 22 (12/26 1300) BP: (123-150)/(55-110) 134/79 mmHg (12/26 1300) SpO2:  [95 %-100 %] 98 % (12/26 1300) FiO2 (%):  [40 %] 40 % (12/26 1152) HEMODYNAMICS:   VENTILATOR SETTINGS: Vent Mode:  [-] PRVC FiO2 (%):  [40 %] 40 % Set Rate:  [14 bmp] 14 bmp Vt Set:  [530 mL] 530 mL PEEP:  [5 cmH20] 5 cmH20 INTAKE / OUTPUT:  Intake/Output Summary (Last 24 hours) at 11/14/15 1323 Last data filed at 11/14/15 1300  Gross per 24 hour  Intake    435 ml  Output      0 ml  Net    435 ml    Review of Systems  Unable to perform ROS  Unable to fully obtain, due to trach and critical illness Physical Exam  Vent dependent Flat affect HEENT WNL, NGT present Trach site clean Chest clear anteriorly Regular rhythm, rate normal, could not appreciate friction rub today Abdomen soft, + BS Ext cool, trace symmetric edema Severe sacral decubitus ulcer - site looks clean 12/21 Profoundly weak with muscle wasting  LABS:  CBC  Recent Labs Lab 11/11/15 0558 11/14/15 0512 11/14/15 0855  WBC 19.3* 19.9* 19.1*  HGB 7.6* 7.3* 7.2*  HCT 25.1* 24.3* 24.1*  PLT 217 238 242  Coag's No results for input(s): APTT, INR in the last 168 hours. BMET  Recent Labs Lab 11/10/15 0933 11/11/15 0558 11/14/15 0855  NA 138 143 141  K 3.6 3.9 4.8  CL 96* 104 100*  CO2 31 33* 31  BUN 47* 61* 87*  CREATININE 1.23* 1.49* 1.71*  GLUCOSE 279* 153* 87   Electrolytes  Recent Labs Lab 11/09/15 1307 11/09/15 1534 11/10/15 0933 11/11/15 0558 11/14/15 0855   CALCIUM 8.5* 8.4* 8.1* 8.5* 8.5*  MG  --  1.8  --   --   --   PHOS 3.3 2.4*  --   --  4.7*   Sepsis Markers No results for input(s): LATICACIDVEN, PROCALCITON, O2SATVEN in the last 168 hours. ABG No results for input(s): PHART, PCO2ART, PO2ART in the last 168 hours. Liver Enzymes  Recent Labs Lab 11/09/15 1307 11/09/15 1534 11/14/15 0855  AST  --  68*  --   ALT  --  43  --   ALKPHOS  --  1677*  --   BILITOT  --  1.2  --   ALBUMIN 1.8* 2.0* 1.6*   Cardiac Enzymes  Recent Labs Lab 11/09/15 1534 11/10/15 0125 11/10/15 0930  TROPONINI 0.11* 0.08* 0.28*   Glucose  Recent Labs Lab 11/13/15 0011 11/13/15 0733 11/13/15 1618 11/13/15 2342 11/14/15 0743 11/14/15 1202  GLUCAP 152* 171* 171* 117* 78 76    Imaging No results found.  ASSESSMENT / PLAN: IMPRESSION: Very prolonged and complicated hospitalization after gastric bypass surgery 2014 Never returned home ESRD Recurrent severe sepsis - PAN resistant Klebisella in sputum Recurrent PNA H/O C diff H/O VTE DM2 - controlled Episodic hypoglycemia Chronic steroid therapy, for a history of of adrenal insufficiency Incidental finding of R hip fx - conservative mgmt. 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 Flutter Recheck CXR 12/26 Recheck resp culture 12/26 ID consult 12/26  Colistin and tigecycline 12/26 per last recs of ID 12/21  Billy Fischeravid Jinelle Butchko, MD PCCM service Mobile [phone] Pager [phone]

## 2015-11-14 NOTE — Progress Notes (Signed)
Pre-hd tx 

## 2015-11-14 NOTE — Progress Notes (Signed)
Subjective:   Patient seen during dialysis Tolerating well  BFR 250 for longer treatment No acute c/o today Able to acknowledge  TF @ 50 cc/hr   Objective:  Vital signs in last 24 hours:  Temp:  [98.5 F (36.9 C)-100.3 F (37.9 C)] 99.1 F (37.3 C) (12/26 0800) Pulse Rate:  [30-84] 67 (12/26 0900) Resp:  [0-34] 21 (12/26 0900) BP: (123-146)/(58-95) 136/68 mmHg (12/26 0900) SpO2:  [95 %-100 %] 100 % (12/26 0900) FiO2 (%):  [40 %] 40 % (12/26 0830)  Weight change:  Filed Weights    Intake/Output: I/O last 3 completed shifts: In: 1965 [I.V.:360; NG/GT:1605] Out: -    Intake/Output this shift:  Total I/O In: 40 [I.V.:20; NG/GT:20] Out: -   Physical Exam: General: critically ill appearing  Head/ENT: OM moist, NG tube  Eyes: Anicteric, eyes open  Neck: Tracheostomy in place  Lungs:  diffuse rhonchi, vent assisted     Heart: NSR on vent  Abdomen:  Soft, nontender, BS present  Extremities: 3+ dependent edema, anasarca  Neurologic: Awakens to touch  Access:  R IJ permcath.       Basic Metabolic Panel:  Recent Labs Lab 11/07/15 1810 11/09/15 1307 11/09/15 1534 11/10/15 0933 11/11/15 0558  NA 146* 144 143 138 143  K 5.0 4.3 3.4* 3.6 3.9  CL 104 105 100* 96* 104  CO2 34* 33* 33* 31 33*  GLUCOSE 116* 98 92 279* 153*  BUN 80* 64* 39* 47* 61*  CREATININE 1.73* 1.41* 0.85 1.23* 1.49*  CALCIUM 8.7* 8.5* 8.4* 8.1* 8.5*  MG  --   --  1.8  --   --   PHOS 4.7* 3.3 2.4*  --   --     Liver Function Tests:  Recent Labs Lab 11/07/15 1810 11/09/15 1307 11/09/15 1534  AST  --   --  68*  ALT  --   --  43  ALKPHOS  --   --  1677*  BILITOT  --   --  1.2  PROT  --   --  6.1*  ALBUMIN 1.9* 1.8* 2.0*   No results for input(s): LIPASE, AMYLASE in the last 168 hours. No results for input(s): AMMONIA in the last 168 hours.  CBC:  Recent Labs Lab 11/07/15 1750 11/09/15 1307 11/10/15 0930 11/11/15 0558 11/14/15 0512  WBC 14.7* 17.7* 23.1* 19.3* 19.9*   NEUTROABS  --   --   --  16.7*  --   HGB 7.9* 7.6* 7.9* 7.6* 7.3*  HCT 26.0* 25.9* 26.5* 25.1* 24.3*  MCV 95.9 97.0 95.4 96.6 94.3  PLT 209 216 230 217 238    Cardiac Enzymes:  Recent Labs Lab 11/09/15 1534 11/10/15 0125 11/10/15 0930  CKTOTAL 5* 5*  --   TROPONINI 0.11* 0.08* 0.28*    BNP: Invalid input(s): POCBNP  CBG:  Recent Labs Lab 11/13/15 0011 11/13/15 0733 11/13/15 1618 11/13/15 2342 11/14/15 0743  GLUCAP 152* 171* 171* 117* 78    Microbiology: Results for orders placed or performed during the hospital encounter of 2015/09/07  Blood Culture (routine x 2)     Status: None   Collection Time: 09/07/15  8:51 AM  Result Value Ref Range Status   Specimen Description BLOOD [name]  Final   Special Requests BOTTLES DRAWN AEROBIC AND ANAEROBIC  3CC  Final   Culture NO GROWTH 5 DAYS  Final   Report Status 08/17/2015 FINAL  Final  Blood Culture (routine x 2)     Status: None  Collection Time: 18-Aug-2015  9:20 AM  Result Value Ref Range Status   Specimen Description BLOOD LEFT ARM  Final   Special Requests BOTTLES DRAWN AEROBIC AND ANAEROBIC  1CC  Final   Culture NO GROWTH 5 DAYS  Final   Report Status 08/17/2015 FINAL  Final  Wound culture     Status: None   Collection Time: August 18, 2015  9:20 AM  Result Value Ref Range Status   Specimen Description DECUBITIS  Final   Special Requests Normal  Final   Gram Stain   Final    FEW WBC SEEN MANY GRAM NEGATIVE RODS RARE GRAM POSITIVE COCCI    Culture   Final    HEAVY GROWTH ESCHERICHIA COLI MODERATE GROWTH ENTEROBACTER AEROGENES PROTEUS MIRABILIS HEAVY GROWTH ENTEROCOCCUS SPECIES VRE HAVE INTRINSIC RESISTANCE TO MOST COMMONLY USED ANTIBIOTICS AND THE ABILITY TO ACQUIRE RESISTANCE TO MOST AVAILABLE ANTIBIOTICS.    Report Status 08/16/2015 FINAL  Final   Organism ID, Bacteria ESCHERICHIA COLI  Final   Organism ID, Bacteria ENTEROBACTER AEROGENES  Final   Organism ID, Bacteria PROTEUS MIRABILIS  Final   Organism ID,  Bacteria ENTEROCOCCUS SPECIES  Final      Susceptibility   Enterobacter aerogenes - MIC*    CEFTAZIDIME <=1 SENSITIVE Sensitive     CEFAZOLIN >=64 RESISTANT Resistant     CEFTRIAXONE <=1 SENSITIVE Sensitive     CIPROFLOXACIN <=0.25 SENSITIVE Sensitive     GENTAMICIN <=1 SENSITIVE Sensitive     IMIPENEM 1 SENSITIVE Sensitive     TRIMETH/SULFA <=20 SENSITIVE Sensitive     * MODERATE GROWTH ENTEROBACTER AEROGENES   Escherichia coli - MIC*    AMPICILLIN <=2 SENSITIVE Sensitive     CEFTAZIDIME <=1 SENSITIVE Sensitive     CEFAZOLIN <=4 SENSITIVE Sensitive     CEFTRIAXONE <=1 SENSITIVE Sensitive     CIPROFLOXACIN <=0.25 SENSITIVE Sensitive     GENTAMICIN <=1 SENSITIVE Sensitive     IMIPENEM <=0.25 SENSITIVE Sensitive     TRIMETH/SULFA <=20 SENSITIVE Sensitive     * HEAVY GROWTH ESCHERICHIA COLI   Proteus mirabilis - MIC*    AMPICILLIN >=32 RESISTANT Resistant     CEFTAZIDIME <=1 SENSITIVE Sensitive     CEFAZOLIN 8 SENSITIVE Sensitive     CEFTRIAXONE <=1 SENSITIVE Sensitive     CIPROFLOXACIN <=0.25 SENSITIVE Sensitive     GENTAMICIN <=1 SENSITIVE Sensitive     IMIPENEM 1 SENSITIVE Sensitive     TRIMETH/SULFA <=20 SENSITIVE Sensitive     * PROTEUS MIRABILIS   Enterococcus species - MIC*    AMPICILLIN >=32 RESISTANT Resistant     VANCOMYCIN >=32 RESISTANT Resistant     GENTAMICIN SYNERGY SENSITIVE Sensitive     TETRACYCLINE Value in next row Resistant      RESISTANT>=16    * HEAVY GROWTH ENTEROCOCCUS SPECIES  MRSA PCR Screening     Status: None   Collection Time: Aug 18, 2015  2:38 PM  Result Value Ref Range Status   MRSA by PCR NEGATIVE NEGATIVE Final    Comment:        The GeneXpert MRSA Assay (FDA approved for NASAL specimens only), is one component of a comprehensive MRSA colonization surveillance program. It is not intended to diagnose MRSA infection nor to guide or monitor treatment for MRSA infections.   Blood culture (single)     Status: None   Collection Time:  Aug 18, 2015  3:36 PM  Result Value Ref Range Status   Specimen Description BLOOD RIGHT ASSIST CONTROL  Final   Special  Requests BOTTLES DRAWN AEROBIC AND ANAEROBIC  Final   Culture NO GROWTH 5 DAYS  Final   Report Status 08/17/2015 FINAL  Final  Wound culture     Status: None   Collection Time: 08/13/15 12:37 PM  Result Value Ref Range Status   Specimen Description WOUND  Final   Special Requests Normal  Final   Gram Stain   Final    FEW WBC SEEN TOO NUMEROUS TO COUNT GRAM NEGATIVE RODS FEW GRAM POSITIVE COCCI    Culture   Final    HEAVY GROWTH ESCHERICHIA COLI MODERATE GROWTH PROTEUS MIRABILIS LIGHT GROWTH KLEBSIELLA PNEUMONIAE MODERATE GROWTH ENTEROCOCCUS GALLINARUM CRITICAL RESULT CALLED TO, READ BACK BY AND VERIFIED WITH: [facility] BORBA AT 1042 08/16/15 DV    Report Status 08/17/2015 FINAL  Final   Organism ID, Bacteria ESCHERICHIA COLI  Final   Organism ID, Bacteria PROTEUS MIRABILIS  Final   Organism ID, Bacteria KLEBSIELLA PNEUMONIAE  Final   Organism ID, Bacteria ENTEROCOCCUS GALLINARUM  Final      Susceptibility   Escherichia coli - MIC*    AMPICILLIN >=32 RESISTANT Resistant     CEFTAZIDIME 4 RESISTANT Resistant     CEFAZOLIN >=64 RESISTANT Resistant     CEFTRIAXONE 16 RESISTANT Resistant     GENTAMICIN 2 SENSITIVE Sensitive     IMIPENEM >=16 RESISTANT Resistant     TRIMETH/SULFA <=20 SENSITIVE Sensitive     Extended ESBL POSITIVE Resistant     PIP/TAZO Value in next row Resistant      RESISTANT>=128    CIPROFLOXACIN Value in next row Sensitive      SENSITIVE<=0.25    * HEAVY GROWTH ESCHERICHIA COLI   Klebsiella pneumoniae - MIC*    AMPICILLIN Value in next row Resistant      SENSITIVE<=0.25    CEFTAZIDIME Value in next row Resistant      SENSITIVE<=0.25    CEFAZOLIN Value in next row Resistant      SENSITIVE<=0.25    CEFTRIAXONE Value in next row Resistant      SENSITIVE<=0.25    CIPROFLOXACIN Value in next row Resistant      SENSITIVE<=0.25     GENTAMICIN Value in next row Sensitive      SENSITIVE<=0.25    IMIPENEM Value in next row Resistant      SENSITIVE<=0.25    TRIMETH/SULFA Value in next row Resistant      SENSITIVE<=0.25    PIP/TAZO Value in next row Resistant      RESISTANT>=128    * LIGHT GROWTH KLEBSIELLA PNEUMONIAE   Proteus mirabilis - MIC*    AMPICILLIN Value in next row Resistant      RESISTANT>=128    CEFTAZIDIME Value in next row Sensitive      RESISTANT>=128    CEFAZOLIN Value in next row Sensitive      RESISTANT>=128    CEFTRIAXONE Value in next row Sensitive      RESISTANT>=128    CIPROFLOXACIN Value in next row Sensitive      RESISTANT>=128    GENTAMICIN Value in next row Sensitive      RESISTANT>=128    IMIPENEM Value in next row Sensitive      RESISTANT>=128    TRIMETH/SULFA Value in next row Sensitive      RESISTANT>=128    PIP/TAZO Value in next row Sensitive      SENSITIVE<=4    * MODERATE GROWTH PROTEUS MIRABILIS   Enterococcus gallinarum - MIC*    AMPICILLIN Value in next row  Resistant      SENSITIVE<=4    GENTAMICIN SYNERGY Value in next row Sensitive      SENSITIVE<=4    CIPROFLOXACIN Value in next row Resistant      RESISTANT>=8    TETRACYCLINE Value in next row Resistant      RESISTANT>=16    * MODERATE GROWTH ENTEROCOCCUS GALLINARUM  Wound culture     Status: None   Collection Time: 08/15/15  2:53 PM  Result Value Ref Range Status   Specimen Description WOUND  Final   Special Requests NONE  Final   Gram Stain FEW WBC SEEN NO ORGANISMS SEEN   Final   Culture NO GROWTH 3 DAYS  Final   Report Status 08/18/2015 FINAL  Final  C difficile quick scan w PCR reflex     Status: None   Collection Time: 08/17/15 11:34 AM  Result Value Ref Range Status   C Diff antigen NEGATIVE NEGATIVE Final   C Diff toxin NEGATIVE NEGATIVE Final   C Diff interpretation Negative for C. difficile  Final  Stool culture     Status: None   Collection Time: 09/03/15  3:51 PM  Result Value Ref Range  Status   Specimen Description STOOL  Final   Special Requests Immunocompromised  Final   Culture   Final    NO SALMONELLA OR SHIGELLA ISOLATED No Pathogenic E. coli detected NO CAMPYLOBACTER DETECTED    Report Status 09/07/2015 FINAL  Final  C difficile quick scan w PCR reflex     Status: None   Collection Time: 09/13/15 12:51 AM  Result Value Ref Range Status   C Diff antigen NEGATIVE NEGATIVE Final   C Diff toxin NEGATIVE NEGATIVE Final   C Diff interpretation Negative for C. difficile  Final  Culture, blood (routine x 2)     Status: None   Collection Time: 09/16/15 11:24 AM  Result Value Ref Range Status   Specimen Description BLOOD LEFT HAND  Final   Special Requests BOTTLES DRAWN AEROBIC AND ANAEROBIC  1CC  Final   Culture NO GROWTH 5 DAYS  Final   Report Status 09/21/2015 FINAL  Final  Culture, blood (routine x 2)     Status: None   Collection Time: 09/16/15 12:23 PM  Result Value Ref Range Status   Specimen Description BLOOD RIGHT HAND  Final   Special Requests BOTTLES DRAWN AEROBIC AND ANAEROBIC  1CC  Final   Culture  Setup Time   Final    GRAM POSITIVE COCCI AEROBIC BOTTLE ONLY CRITICAL RESULT CALLED TO, READ BACK BY AND VERIFIED WITH: TESS THOMAS,RN 09/17/2015 0631 BY JRS.    Culture   Final    ENTEROCOCCUS FAECALIS AEROBIC BOTTLE ONLY VRE HAVE INTRINSIC RESISTANCE TO MOST COMMONLY USED ANTIBIOTICS AND THE ABILITY TO ACQUIRE RESISTANCE TO MOST AVAILABLE ANTIBIOTICS. CRITICAL RESULT CALLED TO, READ BACK BY AND VERIFIED WITH: CHERYL SMITH AT 4098 09/19/15 DV    Report Status 09/21/2015 FINAL  Final   Organism ID, Bacteria ENTEROCOCCUS FAECALIS  Final      Susceptibility   Enterococcus faecalis - MIC*    AMPICILLIN <=2 SENSITIVE Sensitive     LINEZOLID 2 SENSITIVE Sensitive     CIPROFLOXACIN Value in next row Resistant      RESISTANT>=8    TETRACYCLINE Value in next row Resistant      RESISTANT>=16    VANCOMYCIN Value in next row Resistant      RESISTANT>=32     GENTAMICIN SYNERGY Value in next  row Resistant      RESISTANT>=32    * ENTEROCOCCUS FAECALIS  Culture, respiratory (NON-Expectorated)     Status: None   Collection Time: 09/16/15  3:50 PM  Result Value Ref Range Status   Specimen Description TRACHEAL ASPIRATE  Final   Special Requests Immunocompromised  Final   Gram Stain   Final    FEW WBC SEEN GOOD SPECIMEN - 80-90% WBCS RARE GRAM NEGATIVE RODS    Culture LIGHT GROWTH PSEUDOMONAS AERUGINOSA  Final   Report Status 09/19/2015 FINAL  Final   Organism ID, Bacteria PSEUDOMONAS AERUGINOSA  Final      Susceptibility   Pseudomonas aeruginosa - MIC*    CEFTAZIDIME 8 SENSITIVE Sensitive     CIPROFLOXACIN 2 INTERMEDIATE Intermediate     GENTAMICIN >=16 RESISTANT Resistant     IMIPENEM >=16 RESISTANT Resistant     PIP/TAZO Value in next row Sensitive      SENSITIVE32    CEFEPIME Value in next row Sensitive      SENSITIVE8    LEVOFLOXACIN Value in next row Resistant      RESISTANT>=8    * LIGHT GROWTH PSEUDOMONAS AERUGINOSA  Culture, expectorated sputum-assessment     Status: None   Collection Time: 09/22/15  2:09 PM  Result Value Ref Range Status   Specimen Description ENDOTRACHEAL  Final   Special Requests Normal  Final   Sputum evaluation THIS SPECIMEN IS ACCEPTABLE FOR SPUTUM CULTURE  Final   Report Status 09/24/2015 FINAL  Final  Culture, respiratory (NON-Expectorated)     Status: None   Collection Time: 09/22/15  2:09 PM  Result Value Ref Range Status   Specimen Description ENDOTRACHEAL  Final   Special Requests Normal Reflexed from [id number]  Final   Gram Stain   Final    FEW WBC SEEN FEW GRAM NEGATIVE RODS POOR SPECIMEN - LESS THAN 70% WBCS    Culture   Final    MODERATE GROWTH PSEUDOMONAS AERUGINOSA LIGHT GROWTH KLEBSIELLA PNEUMONIAE REFER TO SENSITIVITIES FROM PREVIOUS CULTURE FOR ORG 2    Report Status 10/01/2015 FINAL  Final   Organism ID, Bacteria PSEUDOMONAS AERUGINOSA  Final   Organism ID, Bacteria KLEBSIELLA  PNEUMONIAE  Final      Susceptibility   Pseudomonas aeruginosa - MIC*    CEFTAZIDIME 4 SENSITIVE Sensitive     CIPROFLOXACIN 2 INTERMEDIATE Intermediate     GENTAMICIN >=16 RESISTANT Resistant     IMIPENEM >=16 RESISTANT Resistant     PIP/TAZO Value in next row Sensitive      SENSITIVE16    * MODERATE GROWTH PSEUDOMONAS AERUGINOSA  Tissue culture     Status: None   Collection Time: 09/24/15  6:44 AM  Result Value Ref Range Status   Specimen Description BONE  Final   Special Requests Normal  Final   Gram Stain MODERATE WBC SEEN FEW GRAM NEGATIVE RODS   Final   Culture   Final    MODERATE GROWTH ESCHERICHIA COLI MODERATE GROWTH KLEBSIELLA PNEUMONIAE ESBL-EXTENDED SPECTRUM BETA LACTAMASE-THE ORGANISM IS RESISTANT TO PENICILLINS, CEPHALOSPORINS AND AZTREONAM ACCORDING TO CLSI M100-S15 VOL.25 N01 JAN 2005. ORGANISM 1 This organism isolate is resistant to one or more antiotic agents in three or more antimicrobial categories.  Suggest Infectious Disease consult.   ORGANISM 2 CRITICAL RESULT CALLED TO, READ BACK BY AND VERIFIED WITH: RN Mardene CelesteJOANNA LINDSAY 09/27/15 1005AM    Report Status 09/28/2015 FINAL  Final   Organism ID, Bacteria ESCHERICHIA COLI  Final   Organism ID, Bacteria KLEBSIELLA  PNEUMONIAE  Final      Susceptibility   Escherichia coli - MIC*    AMPICILLIN >=32 RESISTANT Resistant     CEFTAZIDIME 4 RESISTANT Resistant     CEFAZOLIN >=64 RESISTANT Resistant     CEFTRIAXONE 8 RESISTANT Resistant     GENTAMICIN <=1 SENSITIVE Sensitive     IMIPENEM 8 RESISTANT Resistant     TRIMETH/SULFA <=20 SENSITIVE Sensitive     Extended ESBL POSITIVE Resistant     PIP/TAZO Value in next row Resistant      RESISTANT>=128    * MODERATE GROWTH ESCHERICHIA COLI   Klebsiella pneumoniae - MIC*    AMPICILLIN Value in next row Resistant      RESISTANT>=128    CEFTAZIDIME Value in next row Resistant      RESISTANT>=128    CEFAZOLIN Value in next row Resistant      RESISTANT>=128    CEFTRIAXONE  Value in next row Resistant      RESISTANT>=128    CIPROFLOXACIN Value in next row Resistant      RESISTANT>=128    GENTAMICIN Value in next row Resistant      RESISTANT>=128    IMIPENEM Value in next row Resistant      RESISTANT>=128    TRIMETH/SULFA Value in next row Sensitive      RESISTANT>=128    PIP/TAZO Value in next row Resistant      RESISTANT>=128    * MODERATE GROWTH KLEBSIELLA PNEUMONIAE  Anaerobic culture     Status: None   Collection Time: 09/24/15  3:55 PM  Result Value Ref Range Status   Specimen Description BONE  Final   Special Requests Normal  Final   Culture NO ANAEROBES ISOLATED  Final   Report Status 09/29/2015 FINAL  Final  Culture, fungus without smear     Status: None   Collection Time: 09/24/15  3:55 PM  Result Value Ref Range Status   Specimen Description BONE  Final   Special Requests Normal  Final   Culture NO FUNGUS ISOLATED AFTER 24 DAYS  Final   Report Status 10/18/2015 FINAL  Final  C difficile quick scan w PCR reflex     Status: None   Collection Time: 10/07/15  2:02 PM  Result Value Ref Range Status   C Diff antigen NEGATIVE NEGATIVE Final   C Diff toxin NEGATIVE NEGATIVE Final   C Diff interpretation Negative for C. difficile  Final  Culture, blood (routine x 2)     Status: None   Collection Time: 10/16/15  9:52 AM  Result Value Ref Range Status   Specimen Description BLOOD LEFT HAND  Final   Special Requests   Final    BOTTLES DRAWN AEROBIC AND ANAEROBIC  AER 6CC ANA 4CC   Culture NO GROWTH 6 DAYS  Final   Report Status 10/22/2015 FINAL  Final  Culture, blood (routine x 2)     Status: None   Collection Time: 10/16/15 12:25 PM  Result Value Ref Range Status   Specimen Description BLOOD LEFT HAND  Final   Special Requests BOTTLES DRAWN AEROBIC AND ANAEROBIC  5CC  Final   Culture NO GROWTH 6 DAYS  Final   Report Status 10/22/2015 FINAL  Final  Culture, expectorated sputum-assessment     Status: None   Collection Time: 10/18/15  1:15  PM  Result Value Ref Range Status   Specimen Description SPUTUM  Final   Special Requests NONE  Final   Sputum evaluation THIS SPECIMEN IS  ACCEPTABLE FOR SPUTUM CULTURE  Final   Report Status 10/18/2015 FINAL  Final  Culture, respiratory (NON-Expectorated)     Status: None   Collection Time: 10/18/15  1:15 PM  Result Value Ref Range Status   Specimen Description SPUTUM  Final   Special Requests NONE Reflexed from [id number]  Final   Gram Stain   Final    GOOD SPECIMEN - 80-90% WBCS MODERATE WBC SEEN MANY GRAM NEGATIVE RODS    Culture   Final    HEAVY GROWTH PSEUDOMONAS AERUGINOSA MODERATE GROWTH KLEBSIELLA PNEUMONIAE CRITICAL RESULT CALLED TO, READ BACK BY AND VERIFIED WITHRod Mae AT 1610 10/21/15 DV KLEBSIELLA PNEUMONIAE This organism isolate is resistant to one or more antiotic agents in three or more antimicrobial categories.  Suggest Infectious Disease  consult.      Report Status 10/22/2015 FINAL  Final   Organism ID, Bacteria PSEUDOMONAS AERUGINOSA  Final   Organism ID, Bacteria KLEBSIELLA PNEUMONIAE  Final      Susceptibility   Klebsiella pneumoniae - MIC*    AMPICILLIN >=32 RESISTANT Resistant     CEFAZOLIN >=64 RESISTANT Resistant     CEFTRIAXONE >=64 RESISTANT Resistant     CIPROFLOXACIN >=4 RESISTANT Resistant     GENTAMICIN >=16 RESISTANT Resistant     IMIPENEM >=16 RESISTANT Resistant     NITROFURANTOIN >=512 RESISTANT Resistant     TRIMETH/SULFA <=20 SENSITIVE Sensitive     * MODERATE GROWTH KLEBSIELLA PNEUMONIAE   Pseudomonas aeruginosa - MIC*    CEFTAZIDIME 4 SENSITIVE Sensitive     CIPROFLOXACIN >=4 RESISTANT Resistant     GENTAMICIN 8 INTERMEDIATE Intermediate     IMIPENEM >=16 RESISTANT Resistant     PIP/TAZO Value in next row Sensitive      SENSITIVE32    * HEAVY GROWTH PSEUDOMONAS AERUGINOSA  C difficile quick scan w PCR reflex     Status: None   Collection Time: 10/18/15  4:39 PM  Result Value Ref Range Status   C Diff antigen NEGATIVE NEGATIVE  Final   C Diff toxin NEGATIVE NEGATIVE Final   C Diff interpretation Negative for C. difficile  Final  Culture, blood (Routine X 2) w Reflex to ID Panel     Status: None   Collection Time: 11/06/15  8:27 AM  Result Value Ref Range Status   Specimen Description BLOOD RIGHT HAND  Final   Special Requests BOTTLES DRAWN AEROBIC AND ANAEROBIC 3CC  Final   Culture NO GROWTH 5 DAYS  Final   Report Status 11/11/2015 FINAL  Final  Culture, blood (Routine X 2) w Reflex to ID Panel     Status: None   Collection Time: 11/06/15  8:29 AM  Result Value Ref Range Status   Specimen Description BLOOD LEFT HAND  Final   Special Requests BOTTLES DRAWN AEROBIC AND ANAEROBIC  1CC  Final   Culture NO GROWTH 5 DAYS  Final   Report Status 11/11/2015 FINAL  Final  Culture, expectorated sputum-assessment     Status: None   Collection Time: 11/06/15  8:41 AM  Result Value Ref Range Status   Specimen Description EXPECTORATED SPUTUM  Final   Special Requests Immunocompromised  Final   Sputum evaluation THIS SPECIMEN IS ACCEPTABLE FOR SPUTUM CULTURE  Final   Report Status 11/06/2015 FINAL  Final  Culture, respiratory (NON-Expectorated)     Status: None (Preliminary result)   Collection Time: 11/06/15  8:41 AM  Result Value Ref Range Status   Specimen Description EXPECTORATED SPUTUM  Final  Special Requests Immunocompromised Reflexed from [id number]  Final   Gram Stain   Final    MANY WBC SEEN MANY GRAM NEGATIVE RODS EXCELLENT SPECIMEN - 90-100% WBCS    Culture   Final    MODERATE GROWTH KLEBSIELLA PNEUMONIAE MODERATE GROWTH PSEUDOMONAS AERUGINOSA CRITICAL RESULT CALLED TO, READ BACK BY AND VERIFIED WITH: DR. FITZGERALD AT 1120 11/09/15 DV DR. FITZGERALD REQUESTED ORGANISM BE SENT OUT FOR TIGECYCLINE AND COLISTIN Sent to [facility] for further susceptibility testing. 11/11/15 CTJ    Report Status PENDING  Incomplete   Organism ID, Bacteria KLEBSIELLA PNEUMONIAE  Final   Organism ID, Bacteria PSEUDOMONAS  AERUGINOSA  Final      Susceptibility   Klebsiella pneumoniae - MIC*    AMPICILLIN >=32 RESISTANT Resistant     CEFAZOLIN >=64 RESISTANT Resistant     CEFTRIAXONE 32 RESISTANT Resistant     CIPROFLOXACIN >=4 RESISTANT Resistant     GENTAMICIN >=16 RESISTANT Resistant     IMIPENEM >=16 RESISTANT Resistant     NITROFURANTOIN 256 RESISTANT Resistant     TRIMETH/SULFA >=320 RESISTANT Resistant     Extended ESBL POSITIVE Resistant     * MODERATE GROWTH KLEBSIELLA PNEUMONIAE   Pseudomonas aeruginosa - MIC*    CEFTAZIDIME >=64 RESISTANT Resistant     CIPROFLOXACIN 2 INTERMEDIATE Intermediate     GENTAMICIN >=16 RESISTANT Resistant     IMIPENEM >=16 RESISTANT Resistant     * MODERATE GROWTH PSEUDOMONAS AERUGINOSA    Coagulation Studies: No results for input(s): LABPROT, INR in the last 72 hours.  Urinalysis: No results for input(s): COLORURINE, LABSPEC, PHURINE, GLUCOSEU, HGBUR, BILIRUBINUR, KETONESUR, PROTEINUR, UROBILINOGEN, NITRITE, LEUKOCYTESUR in the last 72 hours.  Invalid input(s): APPERANCEUR    Imaging: No results found.   Medications:   . dextrose 5 % and 0.9% NaCl 10 mL/hr at 11/12/15 1900  . feeding supplement (JEVITY 1.5 CAL/FIBER) Stopped (11/13/15 1430)  . norepinephrine Stopped (11/12/15 0751)   . amiodarone  400 mg Per Tube BID  . antiseptic oral rinse  7 mL Mouth Rinse QID  . aspirin  81 mg Oral Daily  . chlorhexidine gluconate  15 mL Mouth Rinse BID  . epoetin (EPOGEN/PROCRIT) injection  10,000 Units Intravenous Q M,W,F-HD  . escitalopram  10 mg Oral Daily  . feeding supplement (PRO-STAT SUGAR FREE 64)  30 mL Oral 6 X Daily  . free water  20 mL Per Tube 6 times per day  . heparin subcutaneous  5,000 Units Subcutaneous Q12H  . hydrocortisone  10 mg Per Tube BID  . lidocaine  1 patch Transdermal Q24H  . midodrine  10 mg Per Tube TID WC  . mirtazapine  7.5 mg Oral QHS  . multivitamin  5 mL Oral Daily  . pantoprazole sodium  40 mg Per Tube Daily  .  saccharomyces boulardii  250 mg Oral BID  . sodium chloride  10-40 mL Intracatheter Q12H  . sodium hypochlorite   Irrigation BID   acetaminophen (TYLENOL) oral liquid 160 mg/5 mL, HYDROmorphone (DILAUDID) injection, ipratropium-albuterol, loperamide, morphine injection, ondansetron (ZOFRAN) IV, oxyCODONE, polyvinyl alcohol, sodium chloride, zinc oxide  Assessment/ Plan:  49 y.o. 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 Admitted on 09/04/15  Echo: 11/07/19: moderately to severely reduced. The estimated ejection fraction was in the range of 30% to 35%. Regional wall motion abnormalities: Severe hypokinesis of the anterior and anteroseptal myocardium.  1. End-stage renal disease on hemodialysis on HD MWF. The patient has been on dialysis since October of 2014. R IJ permcath. Dialysis treatment with IV albumin for oncotic support. Midodrine and hydrocortisone ordered for blood pressure support.  - Continue MWF schedule.     - doing better today with slow BFR of 250. tx time 4 hrs; so far BP is OK without albumin - UF goal 1000 cc if tolerated    HEMODIALYSIS FLOWSHEET:  Blood Flow Rate (mL/min): 250 mL/min Arterial Pressure (mmHg): -60 mmHg Venous Pressure (mmHg): 110 mmHg Transmembrane Pressure (mmHg): 40 mmHg Ultrafiltration Rate (mL/min): 260 mL/min Dialysate Flow Rate (mL/min): 600 ml/min Conductivity: Machine :  15 Conductivity: Machine : 15 Dialysis Fluid Bolus: Normal Saline Bolus Amount (mL): 250 mL Dialysate Change: 2K (K+ 5.0 & Dr Thedore Mins aware) Intra-Hemodialysis Comments: 54ml.Marland KitchenMarland KitchenResting with eyes closed    2. Anemia of CKD. Hemoglobin 7.3 - Continue scheduled Epogen with dialysis.  -  1 unit PRBC  Given 12/18  3. Acute resp failure:  -Patient remains ventilator dependent at this point in time.   4. Sepsis: VRE in blood. Bone cultures E. Coli and klebsiella  - Treatment as per ID and ICU team:     5. Generalized edema/anasarca Patient is not able to tolerate fluid removal with dialysis      LOS: 94 Courtney [name] 12/26/20169:36 AM

## 2015-11-14 NOTE — Progress Notes (Signed)
Post hd tx 

## 2015-11-14 NOTE — Progress Notes (Signed)
HD tx completed.

## 2015-11-14 NOTE — Progress Notes (Signed)
MEDICATION RELATED CONSULT NOTE - Follow up   Pharmacy Consult for Renal dosing  Allergies  Allergen Reactions  . Contrast Media [Iodinated Diagnostic Agents] Anaphylaxis  . Ampicillin Rash   Patient Measurements: Ht 75ft 9in Height:  (SCALE BROKE) Weight:  (bed scale is broken) IBW/kg (Calculated) : 66.2  Vital Signs: Temp: 99.1 F (37.3 C) (12/26 0800) Temp Source: Oral (12/26 0800) BP: 134/79 mmHg (12/26 1300) Pulse Rate: 70 (12/26 0945)  Recent Labs  11/14/15 0512 11/14/15 0855  WBC 19.9* 19.1*  HGB 7.3* 7.2*  HCT 24.3* 24.1*  PLT 238 242  CREATININE  --  1.71*  PHOS  --  4.7*  ALBUMIN  --  1.6*   Estimated Creatinine Clearance: 44.7 mL/min (by C-G formula based on Cr of 1.71).  Assessment: 49 yo patient on HD. Pharmacy consulted for renal dosing of medications.   Plan:  No medications require adjustment at present. Pharmacy will continue to monitor and dose adjust as needed.    Cher NakaiSheema Feliz Lincoln, PharmD Pharmacy Resident 11/14/2015

## 2015-11-15 ENCOUNTER — Inpatient Hospital Stay: Payer: 59

## 2015-11-15 DIAGNOSIS — J9620 Acute and chronic respiratory failure, unspecified whether with hypoxia or hypercapnia: Secondary | ICD-10-CM

## 2015-11-15 LAB — CULTURE, RESPIRATORY W GRAM STAIN

## 2015-11-15 LAB — GLUCOSE, CAPILLARY
GLUCOSE-CAPILLARY: 84 mg/dL (ref 65–99)
Glucose-Capillary: 103 mg/dL — ABNORMAL HIGH (ref 65–99)
Glucose-Capillary: 74 mg/dL (ref 65–99)
Glucose-Capillary: 80 mg/dL (ref 65–99)
Glucose-Capillary: 95 mg/dL (ref 65–99)

## 2015-11-15 LAB — CULTURE, RESPIRATORY

## 2015-11-15 MED ORDER — AMIODARONE HCL 200 MG PO TABS
200.0000 mg | ORAL_TABLET | Freq: Every day | ORAL | Status: DC
Start: 2015-11-25 — End: 2015-11-22

## 2015-11-15 MED ORDER — AMIODARONE HCL 200 MG PO TABS
200.0000 mg | ORAL_TABLET | Freq: Two times a day (BID) | ORAL | Status: DC
  Administered 2015-11-18 – 2015-11-22 (×8): 200 mg
  Filled 2015-11-15 (×7): qty 1

## 2015-11-15 NOTE — Progress Notes (Signed)
Physical Therapy Treatment Patient Details Name: Courtney [name] MRN: [id number] DOB: 12-31-1965 Today's Date: 11/15/2015    History of Present Illness Pt is a 89 F who has been in medical facilities (hosp, LTAC, rehab) for 2 yrs following gastric bypass surgery with multiple complications. Now with chronic trach, ESRD, profound debilitation, severe sacral pressure ulcer (stage IV). Was seemingly making progress and transferred to rehab facility approx one week prior to this admission. [facility] with AMS and hypotension. Admitted with sepsis related to infected sacral ulcer.  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  Patient transferred to CCU to med-surg floor during hospitalization, but experienced profound interdialytic hypotension requiring return transfer to CCU for use of pressors (now off); remains in CCU.  Noted with R hip intertrochanteric fracture, recommended for conservative management per orthopedics.  Hospital course continues to be complicated with recurrent sepsis requiring patient on/off vent multiple times; currently on vent support with PEEP 5.  Now noted with anterior wall MI and significantly diminished EF (35%) since admission.  Intermittent ability/willingness to follow commands or actively participate in therapeutic interventions.  Per care team, physicians recommending hospice care/comfort measures at this time; husband considering goals of care/POC pending discussion with patient and other family members, but wishes continued medical care at this time. Will carefully monitor medical plan and POC to evaluate appropriateness of continued PT throughout remaining hospitalization.    PT Comments    Husband in room throughout session; reports intermittently assisting patient with ROM activities outside of  therapy.  Husband did suggest therapist complete ROM to R LE, but therapist reinforced current orders/recommendations in light of R LE fracture (conservative management, no ROM, no WBing)--husband reports having awareness of fracture to R LE prior to this hospitalization (at least year to year and a half).  States he/team continued to carry-on with plan as tolerated, as there were no other options (not candidate for repair). Patient generally fatigued this date; opens eyes when cued, but maintains eyes closed majority of session.   Follow Up Recommendations  LTACH     Equipment Recommendations       Recommendations for Other Services       Precautions / Restrictions Precautions Precautions: Fall Precaution Comments: stage 4 sacral ulcer, NG tube/NPO, trach, contact isolation, R IJ permcath, L IJ central line, log roll only (as needed for dressing changes/hygiene) and no ROM to R LE/no attempts at sitting Restrictions RLE Weight Bearing: Non weight bearing Other Position/Activity Restrictions: No ROM R LE    Mobility  Bed Mobility               General bed mobility comments: Deferred/contraindicated  Transfers                    Ambulation/Gait                 Stairs            Wheelchair Mobility    Modified Rankin (Stroke Patients Only)       Balance                                    Cognition Arousal/Alertness: Lethargic  Exercises Other Exercises Other Exercises: Act assist vs passive ROM to bilat UEs, 1x10: gross grasp/release, wrist flex/ext, forearm pronation/supination, elbow flex/ext, shoulder IR/ER.  Difficulty maintaining alertness for optimal active participation with therex this date. Other Exercises: L LE passive ROM, 1x10: ankle PF/DF, hip/knee flex/ext (limited to approx 45 degrees secondary to pain), hip abduct/adduct/IR/ER.  Facial grimacing with movement of L hip/knee.       General Comments        Pertinent Vitals/Pain Pain Assessment: Faces Faces Pain Scale: Hurts even more Pain Location: L LE with passive ROM efforts Pain Descriptors / Indicators: Guarding Pain Intervention(s): Limited activity within patient's tolerance;Monitored during session;Repositioned    Home Living                      Prior Function            PT Goals (current goals can now be found in the care plan section) Acute Rehab PT Goals Patient Stated Goal: patient unable to verbalize/state goal; husband continues with unrealistic expectations per discussion during session PT Goal Formulation: Patient unable to participate in goal setting Time For Goal Achievement: 11/25/15 Potential to Achieve Goals: Poor Progress towards PT goals: Not progressing toward goals - comment (limited by medical condition/co-morbidities)    Frequency  Min 2X/week    PT Plan Current plan remains appropriate    Co-evaluation             End of Session   Activity Tolerance: Patient limited by lethargy;Patient limited by pain Patient left: in bed;with family/visitor present     Time: 1157-1220 PT Time Calculation (min) (ACUTE ONLY): 23 min  Charges:  $Therapeutic Exercise: 23-37 mins                    G Codes:       Costantino Kohlbeck H. Manson Passey, PT, DPT, NCS 11/15/2015, 1:29 PM [phone]

## 2015-11-15 NOTE — Progress Notes (Signed)
Dr Bard HerbertSimmonds notified of pt's run of V tach from this a.m and during my shift, no new orders obtained. Will monitor pt closely.

## 2015-11-15 NOTE — Progress Notes (Signed)
ANTIBIOTIC CONSULT NOTE - INITIAL  Pharmacy Consult for Colistin and Tigecycline  Indication: MDR Respiratory Infection   Allergies  Allergen Reactions  . Contrast Media [Iodinated Diagnostic Agents] Anaphylaxis  . Ampicillin Rash   Patient Measurements: Height:  (SCALE BROKE) Weight:  (bed scale is broken) IBW/kg (Calculated) : 66.2  Vital Signs: Temp: 98.7 F (37.1 C) (12/27 1100) Temp Source: Oral (12/27 1100) BP: 128/58 mmHg (12/27 1000) Pulse Rate: 59 (12/27 1000) Intake/Output from previous day: 12/26 0701 - 12/27 0700 In: 250 [I.V.:230; NG/GT:20] Out: 1201 [Stool:1] Intake/Output from this shift: Total I/O In: 160 [I.V.:40; Other:120] Out: -   Recent Labs  11/14/15 0512 11/14/15 0855  WBC 19.9* 19.1*  HGB 7.3* 7.2*  PLT 238 242  CREATININE  --  1.71*   Estimated Creatinine Clearance: 44.7 mL/min (by C-G formula based on Cr of 1.71). No results for input(s): VANCOTROUGH, VANCOPEAK, VANCORANDOM, GENTTROUGH, GENTPEAK, GENTRANDOM, TOBRATROUGH, TOBRAPEAK, TOBRARND, AMIKACINPEAK, AMIKACINTROU, AMIKACIN in the last 72 hours.   Microbiology: Recent Results (from the past 720 hour(s))  Culture, expectorated sputum-assessment     Status: None   Collection Time: 10/18/15  1:15 PM  Result Value Ref Range Status   Specimen Description SPUTUM  Final   Special Requests NONE  Final   Sputum evaluation THIS SPECIMEN IS ACCEPTABLE FOR SPUTUM CULTURE  Final   Report Status 10/18/2015 FINAL  Final  Culture, respiratory (NON-Expectorated)     Status: None   Collection Time: 10/18/15  1:15 PM  Result Value Ref Range Status   Specimen Description SPUTUM  Final   Special Requests NONE Reflexed from [id number]  Final   Gram Stain   Final    GOOD SPECIMEN - 80-90% WBCS MODERATE WBC SEEN MANY GRAM NEGATIVE RODS    Culture   Final    HEAVY GROWTH PSEUDOMONAS AERUGINOSA MODERATE GROWTH KLEBSIELLA PNEUMONIAE CRITICAL RESULT CALLED TO, READ BACK BY AND VERIFIED WITHRod Mae: JOANNA  LINDSAY AT [id number] 10/21/15 DV KLEBSIELLA PNEUMONIAE This organism isolate is resistant to one or more antiotic agents in three or more antimicrobial categories.  Suggest Infectious Disease  consult.      Report Status 10/22/2015 FINAL  Final   Organism ID, Bacteria PSEUDOMONAS AERUGINOSA  Final   Organism ID, Bacteria KLEBSIELLA PNEUMONIAE  Final      Susceptibility   Klebsiella pneumoniae - MIC*    AMPICILLIN >=32 RESISTANT Resistant     CEFAZOLIN >=64 RESISTANT Resistant     CEFTRIAXONE >=64 RESISTANT Resistant     CIPROFLOXACIN >=4 RESISTANT Resistant     GENTAMICIN >=16 RESISTANT Resistant     IMIPENEM >=16 RESISTANT Resistant     NITROFURANTOIN >=512 RESISTANT Resistant     TRIMETH/SULFA <=20 SENSITIVE Sensitive     * MODERATE GROWTH KLEBSIELLA PNEUMONIAE   Pseudomonas aeruginosa - MIC*    CEFTAZIDIME 4 SENSITIVE Sensitive     CIPROFLOXACIN >=4 RESISTANT Resistant     GENTAMICIN 8 INTERMEDIATE Intermediate     IMIPENEM >=16 RESISTANT Resistant     PIP/TAZO Value in next row Sensitive      SENSITIVE32    * HEAVY GROWTH PSEUDOMONAS AERUGINOSA  C difficile quick scan w PCR reflex     Status: None   Collection Time: 10/18/15  4:39 PM  Result Value Ref Range Status   C Diff antigen NEGATIVE NEGATIVE Final   C Diff toxin NEGATIVE NEGATIVE Final   C Diff interpretation Negative for C. difficile  Final  Culture, blood (Routine X 2) w Reflex to ID  Panel     Status: None   Collection Time: 11/06/15  8:27 AM  Result Value Ref Range Status   Specimen Description BLOOD RIGHT HAND  Final   Special Requests BOTTLES DRAWN AEROBIC AND ANAEROBIC 3CC  Final   Culture NO GROWTH 5 DAYS  Final   Report Status 11/11/2015 FINAL  Final  Culture, blood (Routine X 2) w Reflex to ID Panel     Status: None   Collection Time: 11/06/15  8:29 AM  Result Value Ref Range Status   Specimen Description BLOOD LEFT HAND  Final   Special Requests BOTTLES DRAWN AEROBIC AND ANAEROBIC  1CC  Final   Culture NO  GROWTH 5 DAYS  Final   Report Status 11/11/2015 FINAL  Final  Culture, expectorated sputum-assessment     Status: None   Collection Time: 11/06/15  8:41 AM  Result Value Ref Range Status   Specimen Description EXPECTORATED SPUTUM  Final   Special Requests Immunocompromised  Final   Sputum evaluation THIS SPECIMEN IS ACCEPTABLE FOR SPUTUM CULTURE  Final   Report Status 11/06/2015 FINAL  Final  Culture, respiratory (NON-Expectorated)     Status: None (Preliminary result)   Collection Time: 11/06/15  8:41 AM  Result Value Ref Range Status   Specimen Description EXPECTORATED SPUTUM  Final   Special Requests Immunocompromised Reflexed from [id number]  Final   Gram Stain   Final    MANY WBC SEEN MANY GRAM NEGATIVE RODS EXCELLENT SPECIMEN - 90-100% WBCS    Culture   Final    MODERATE GROWTH KLEBSIELLA PNEUMONIAE MODERATE GROWTH PSEUDOMONAS AERUGINOSA CRITICAL RESULT CALLED TO, READ BACK BY AND VERIFIED WITH: DR. FITZGERALD AT 1120 11/09/15 DV DR. FITZGERALD REQUESTED ORGANISM BE SENT OUT FOR TIGECYCLINE AND COLISTIN Sent to [facility] for further susceptibility testing. 11/11/15 CTJ    Report Status PENDING  Incomplete   Organism ID, Bacteria KLEBSIELLA PNEUMONIAE  Final   Organism ID, Bacteria PSEUDOMONAS AERUGINOSA  Final      Susceptibility   Klebsiella pneumoniae - MIC*    AMPICILLIN >=32 RESISTANT Resistant     CEFAZOLIN >=64 RESISTANT Resistant     CEFTRIAXONE 32 RESISTANT Resistant     CIPROFLOXACIN >=4 RESISTANT Resistant     GENTAMICIN >=16 RESISTANT Resistant     IMIPENEM >=16 RESISTANT Resistant     NITROFURANTOIN 256 RESISTANT Resistant     TRIMETH/SULFA >=320 RESISTANT Resistant     Extended ESBL POSITIVE Resistant     * MODERATE GROWTH KLEBSIELLA PNEUMONIAE   Pseudomonas aeruginosa - MIC*    CEFTAZIDIME >=64 RESISTANT Resistant     CIPROFLOXACIN 2 INTERMEDIATE Intermediate     GENTAMICIN >=16 RESISTANT Resistant     IMIPENEM >=16 RESISTANT Resistant     * MODERATE GROWTH  PSEUDOMONAS AERUGINOSA  Culture, respiratory (NON-Expectorated)     Status: None (Preliminary result)   Collection Time: 11/14/15  2:07 PM  Result Value Ref Range Status   Specimen Description TRACHEAL ASPIRATE  Final   Special Requests NONE  Final   Gram Stain PENDING  Incomplete   Culture   Final    HEAVY GROWTH GRAM NEGATIVE RODS IDENTIFICATION AND SUSCEPTIBILITIES TO FOLLOW    Report Status PENDING  Incomplete    Medical History: Past Medical History  Diagnosis Date  . Obesity   . Dyslipidemia   . Hypertension   . Coronary artery disease     s/p BMS 2010 LAD  . Dysrhythmia     ventricular tachycardia resolved after LAD stent and  beta blocker  . Diabetes mellitus     with retinopathy, neuropathy and microalbuminemia  . ESRD (end stage renal disease) on dialysis    Assessment: 49 yo female with chronic trach, ESRD, and  profound debilitation, needing treatment for MDR Respiratory infection. Patient is on day 95 of hospital stay, and has been treated for multiple infections during this time.   11/06/15: Respiratory cultures showing Pan-resistant  Klebsiella pneumoniae and Pseudomonas aeruginosa  Sputum has been sent to [facility] for susceptibility testing per Dr. Jarrett Ables request.   Patient has increased respiratory secretions, leukocytosis and low grade fever   Plan:  Continue tigecycline  IV q12h.   Continue colistimethate  (2.5 mg/kg) IV q48 hours.   Patient should be monitored for nephrotoxicity and neurotoxicity.  Pharmacy to continue to follow per consult.      Luisa Hart, PharmD   11/15/2015,12:49 PM

## 2015-11-15 NOTE — Progress Notes (Signed)
ID e note Reviewed notes, labs, imaging.  She has bene started on colistin and tigecycline I spoke with the lab sensitivity testing is still pending for ceftazidime -avibactam, tigecycline and colisitin   Continue current abx.  Will adjust based on sensitivities.

## 2015-11-15 NOTE — Progress Notes (Signed)
Subjective:  Patient had hemodialysis yesterday. Tube feeds currently on hold. Remains on the ventilator with FiO2 40%. More somnolent this a.m.    Objective:  Vital signs in last 24 hours:  Temp:  [97.9 F (36.6 C)-98.8 F (37.1 C)] 98.5 F (36.9 C) (12/27 0500) Pulse Rate:  [57-87] 58 (12/27 0600) Resp:  [15-27] 20 (12/27 0600) BP: (114-150)/(38-110) 122/57 mmHg (12/27 0600) SpO2:  [97 %-100 %] 100 % (12/27 0600) FiO2 (%):  [40 %] 40 % (12/27 0829)  Weight change:  Filed Weights    Intake/Output: I/O last 3 completed shifts: In: 460 [I.V.:340; NG/GT:120] Out: 1201 [Other:1200; Stool:1]   Intake/Output this shift:     Physical Exam: General: critically ill appearing  Head/ENT: OM moist, NG tube  Eyes: Anicteric, eyes closed  Neck: Tracheostomy in place  Lungs:  bilatearl rhonchi, vent assisted fio2 40%  Heart: S1S2 no rubs  Abdomen:  Soft, nontender, BS present  Extremities: 2+ dependent edema, anasarca  Neurologic: Somnolent at the moment  Access:  R IJ permcath.       Basic Metabolic Panel:  Recent Labs Lab 11/09/15 1307 11/09/15 1534 11/10/15 0933 11/11/15 0558 11/14/15 0855  NA 144 143 138 143 141  K 4.3 3.4* 3.6 3.9 4.8  CL 105 100* 96* 104 100*  CO2 33* 33* 31 33* 31  GLUCOSE 98 92 279* 153* 87  BUN 64* 39* 47* 61* 87*  CREATININE 1.41* 0.85 1.23* 1.49* 1.71*  CALCIUM 8.5* 8.4* 8.1* 8.5* 8.5*  MG  --  1.8  --   --   --   PHOS 3.3 2.4*  --   --  4.7*    Liver Function Tests:  Recent Labs Lab 11/09/15 1307 11/09/15 1534 11/14/15 0855  AST  --  68*  --   ALT  --  43  --   ALKPHOS  --  1677*  --   BILITOT  --  1.2  --   PROT  --  6.1*  --   ALBUMIN 1.8* 2.0* 1.6*   No results for input(s): LIPASE, AMYLASE in the last 168 hours. No results for input(s): AMMONIA in the last 168 hours.  CBC:  Recent Labs Lab 11/09/15 1307 11/10/15 0930 11/11/15 0558 11/14/15 0512 11/14/15 0855  WBC 17.7* 23.1* 19.3* 19.9* 19.1*  NEUTROABS   --   --  16.7*  --   --   HGB 7.6* 7.9* 7.6* 7.3* 7.2*  HCT 25.9* 26.5* 25.1* 24.3* 24.1*  MCV 97.0 95.4 96.6 94.3 94.9  PLT 216 230 217 238 242    Cardiac Enzymes:  Recent Labs Lab 11/09/15 1534 11/10/15 0125 11/10/15 0930  CKTOTAL 5* 5*  --   TROPONINI 0.11* 0.08* 0.28*    BNP: Invalid input(s): POCBNP  CBG:  Recent Labs Lab 11/14/15 2051 11/14/15 2212 11/15/15 0016 11/15/15 0454 11/15/15 0749  GLUCAP 68 135* 103* 84 74    Microbiology: Results for orders placed or performed during the hospital encounter of 08/10/2015  Blood Culture (routine x 2)     Status: None   Collection Time: 07/28/2015  8:51 AM  Result Value Ref Range Status   Specimen Description BLOOD [name]  Final   Special Requests BOTTLES DRAWN AEROBIC AND ANAEROBIC  3CC  Final   Culture NO GROWTH 5 DAYS  Final   Report Status 08/17/2015 FINAL  Final  Blood Culture (routine x 2)     Status: None   Collection Time: 08/14/2015  9:20 AM  Result Value  Ref Range Status   Specimen Description BLOOD LEFT ARM  Final   Special Requests BOTTLES DRAWN AEROBIC AND ANAEROBIC  1CC  Final   Culture NO GROWTH 5 DAYS  Final   Report Status 08/17/2015 FINAL  Final  Wound culture     Status: None   Collection Time: 2015-02-18  9:20 AM  Result Value Ref Range Status   Specimen Description DECUBITIS  Final   Special Requests Normal  Final   Gram Stain   Final    FEW WBC SEEN MANY GRAM NEGATIVE RODS RARE GRAM POSITIVE COCCI    Culture   Final    HEAVY GROWTH ESCHERICHIA COLI MODERATE GROWTH ENTEROBACTER AEROGENES PROTEUS MIRABILIS HEAVY GROWTH ENTEROCOCCUS SPECIES VRE HAVE INTRINSIC RESISTANCE TO MOST COMMONLY USED ANTIBIOTICS AND THE ABILITY TO ACQUIRE RESISTANCE TO MOST AVAILABLE ANTIBIOTICS.    Report Status 08/16/2015 FINAL  Final   Organism ID, Bacteria ESCHERICHIA COLI  Final   Organism ID, Bacteria ENTEROBACTER AEROGENES  Final   Organism ID, Bacteria PROTEUS MIRABILIS  Final   Organism ID, Bacteria  ENTEROCOCCUS SPECIES  Final      Susceptibility   Enterobacter aerogenes - MIC*    CEFTAZIDIME <=1 SENSITIVE Sensitive     CEFAZOLIN >=64 RESISTANT Resistant     CEFTRIAXONE <=1 SENSITIVE Sensitive     CIPROFLOXACIN <=0.25 SENSITIVE Sensitive     GENTAMICIN <=1 SENSITIVE Sensitive     IMIPENEM 1 SENSITIVE Sensitive     TRIMETH/SULFA <=20 SENSITIVE Sensitive     * MODERATE GROWTH ENTEROBACTER AEROGENES   Escherichia coli - MIC*    AMPICILLIN <=2 SENSITIVE Sensitive     CEFTAZIDIME <=1 SENSITIVE Sensitive     CEFAZOLIN <=4 SENSITIVE Sensitive     CEFTRIAXONE <=1 SENSITIVE Sensitive     CIPROFLOXACIN <=0.25 SENSITIVE Sensitive     GENTAMICIN <=1 SENSITIVE Sensitive     IMIPENEM <=0.25 SENSITIVE Sensitive     TRIMETH/SULFA <=20 SENSITIVE Sensitive     * HEAVY GROWTH ESCHERICHIA COLI   Proteus mirabilis - MIC*    AMPICILLIN >=32 RESISTANT Resistant     CEFTAZIDIME <=1 SENSITIVE Sensitive     CEFAZOLIN 8 SENSITIVE Sensitive     CEFTRIAXONE <=1 SENSITIVE Sensitive     CIPROFLOXACIN <=0.25 SENSITIVE Sensitive     GENTAMICIN <=1 SENSITIVE Sensitive     IMIPENEM 1 SENSITIVE Sensitive     TRIMETH/SULFA <=20 SENSITIVE Sensitive     * PROTEUS MIRABILIS   Enterococcus species - MIC*    AMPICILLIN >=32 RESISTANT Resistant     VANCOMYCIN >=32 RESISTANT Resistant     GENTAMICIN SYNERGY SENSITIVE Sensitive     TETRACYCLINE Value in next row Resistant      RESISTANT>=16    * HEAVY GROWTH ENTEROCOCCUS SPECIES  MRSA PCR Screening     Status: None   Collection Time: 2015-02-18  2:38 PM  Result Value Ref Range Status   MRSA by PCR NEGATIVE NEGATIVE Final    Comment:        The GeneXpert MRSA Assay (FDA approved for NASAL specimens only), is one component of a comprehensive MRSA colonization surveillance program. It is not intended to diagnose MRSA infection nor to guide or monitor treatment for MRSA infections.   Blood culture (single)     Status: None   Collection Time: 2015-02-18  3:36  PM  Result Value Ref Range Status   Specimen Description BLOOD RIGHT ASSIST CONTROL  Final   Special Requests BOTTLES DRAWN AEROBIC AND ANAEROBIC 5ML  Final  Culture NO GROWTH 5 DAYS  Final   Report Status 08/17/2015 FINAL  Final  Wound culture     Status: None   Collection Time: 08/13/15 12:37 PM  Result Value Ref Range Status   Specimen Description WOUND  Final   Special Requests Normal  Final   Gram Stain   Final    FEW WBC SEEN TOO NUMEROUS TO COUNT GRAM NEGATIVE RODS FEW GRAM POSITIVE COCCI    Culture   Final    HEAVY GROWTH ESCHERICHIA COLI MODERATE GROWTH PROTEUS MIRABILIS LIGHT GROWTH KLEBSIELLA PNEUMONIAE MODERATE GROWTH ENTEROCOCCUS GALLINARUM CRITICAL RESULT CALLED TO, READ BACK BY AND VERIFIED WITH: [facility] BORBA AT 1042 08/16/15 DV    Report Status 08/17/2015 FINAL  Final   Organism ID, Bacteria ESCHERICHIA COLI  Final   Organism ID, Bacteria PROTEUS MIRABILIS  Final   Organism ID, Bacteria KLEBSIELLA PNEUMONIAE  Final   Organism ID, Bacteria ENTEROCOCCUS GALLINARUM  Final      Susceptibility   Escherichia coli - MIC*    AMPICILLIN >=32 RESISTANT Resistant     CEFTAZIDIME 4 RESISTANT Resistant     CEFAZOLIN >=64 RESISTANT Resistant     CEFTRIAXONE 16 RESISTANT Resistant     GENTAMICIN 2 SENSITIVE Sensitive     IMIPENEM >=16 RESISTANT Resistant     TRIMETH/SULFA <=20 SENSITIVE Sensitive     Extended ESBL POSITIVE Resistant     PIP/TAZO Value in next row Resistant      RESISTANT>=128    CIPROFLOXACIN Value in next row Sensitive      SENSITIVE<=0.25    * HEAVY GROWTH ESCHERICHIA COLI   Klebsiella pneumoniae - MIC*    AMPICILLIN Value in next row Resistant      SENSITIVE<=0.25    CEFTAZIDIME Value in next row Resistant      SENSITIVE<=0.25    CEFAZOLIN Value in next row Resistant      SENSITIVE<=0.25    CEFTRIAXONE Value in next row Resistant      SENSITIVE<=0.25    CIPROFLOXACIN Value in next row Resistant      SENSITIVE<=0.25    GENTAMICIN Value in next  row Sensitive      SENSITIVE<=0.25    IMIPENEM Value in next row Resistant      SENSITIVE<=0.25    TRIMETH/SULFA Value in next row Resistant      SENSITIVE<=0.25    PIP/TAZO Value in next row Resistant      RESISTANT>=128    * LIGHT GROWTH KLEBSIELLA PNEUMONIAE   Proteus mirabilis - MIC*    AMPICILLIN Value in next row Resistant      RESISTANT>=128    CEFTAZIDIME Value in next row Sensitive      RESISTANT>=128    CEFAZOLIN Value in next row Sensitive      RESISTANT>=128    CEFTRIAXONE Value in next row Sensitive      RESISTANT>=128    CIPROFLOXACIN Value in next row Sensitive      RESISTANT>=128    GENTAMICIN Value in next row Sensitive      RESISTANT>=128    IMIPENEM Value in next row Sensitive      RESISTANT>=128    TRIMETH/SULFA Value in next row Sensitive      RESISTANT>=128    PIP/TAZO Value in next row Sensitive      SENSITIVE<=4    * MODERATE GROWTH PROTEUS MIRABILIS   Enterococcus gallinarum - MIC*    AMPICILLIN Value in next row Resistant      SENSITIVE<=4    GENTAMICIN  SYNERGY Value in next row Sensitive      SENSITIVE<=4    CIPROFLOXACIN Value in next row Resistant      RESISTANT>=8    TETRACYCLINE Value in next row Resistant      RESISTANT>=16    * MODERATE GROWTH ENTEROCOCCUS GALLINARUM  Wound culture     Status: None   Collection Time: 08/15/15  2:53 PM  Result Value Ref Range Status   Specimen Description WOUND  Final   Special Requests NONE  Final   Gram Stain FEW WBC SEEN NO ORGANISMS SEEN   Final   Culture NO GROWTH 3 DAYS  Final   Report Status 08/18/2015 FINAL  Final  C difficile quick scan w PCR reflex     Status: None   Collection Time: 08/17/15 11:34 AM  Result Value Ref Range Status   C Diff antigen NEGATIVE NEGATIVE Final   C Diff toxin NEGATIVE NEGATIVE Final   C Diff interpretation Negative for C. difficile  Final  Stool culture     Status: None   Collection Time: 09/03/15  3:51 PM  Result Value Ref Range Status   Specimen  Description STOOL  Final   Special Requests Immunocompromised  Final   Culture   Final    NO SALMONELLA OR SHIGELLA ISOLATED No Pathogenic E. coli detected NO CAMPYLOBACTER DETECTED    Report Status 09/07/2015 FINAL  Final  C difficile quick scan w PCR reflex     Status: None   Collection Time: 09/13/15 12:51 AM  Result Value Ref Range Status   C Diff antigen NEGATIVE NEGATIVE Final   C Diff toxin NEGATIVE NEGATIVE Final   C Diff interpretation Negative for C. difficile  Final  Culture, blood (routine x 2)     Status: None   Collection Time: 09/16/15 11:24 AM  Result Value Ref Range Status   Specimen Description BLOOD LEFT HAND  Final   Special Requests BOTTLES DRAWN AEROBIC AND ANAEROBIC  1CC  Final   Culture NO GROWTH 5 DAYS  Final   Report Status 09/21/2015 FINAL  Final  Culture, blood (routine x 2)     Status: None   Collection Time: 09/16/15 12:23 PM  Result Value Ref Range Status   Specimen Description BLOOD RIGHT HAND  Final   Special Requests BOTTLES DRAWN AEROBIC AND ANAEROBIC  1CC  Final   Culture  Setup Time   Final    GRAM POSITIVE COCCI AEROBIC BOTTLE ONLY CRITICAL RESULT CALLED TO, READ BACK BY AND VERIFIED WITH: TESS THOMAS,RN 09/17/2015 0631 BY JRS.    Culture   Final    ENTEROCOCCUS FAECALIS AEROBIC BOTTLE ONLY VRE HAVE INTRINSIC RESISTANCE TO MOST COMMONLY USED ANTIBIOTICS AND THE ABILITY TO ACQUIRE RESISTANCE TO MOST AVAILABLE ANTIBIOTICS. CRITICAL RESULT CALLED TO, READ BACK BY AND VERIFIED WITH: CHERYL SMITH AT 1610 09/19/15 DV    Report Status 09/21/2015 FINAL  Final   Organism ID, Bacteria ENTEROCOCCUS FAECALIS  Final      Susceptibility   Enterococcus faecalis - MIC*    AMPICILLIN <=2 SENSITIVE Sensitive     LINEZOLID 2 SENSITIVE Sensitive     CIPROFLOXACIN Value in next row Resistant      RESISTANT>=8    TETRACYCLINE Value in next row Resistant      RESISTANT>=16    VANCOMYCIN Value in next row Resistant      RESISTANT>=32    GENTAMICIN  SYNERGY Value in next row Resistant      RESISTANT>=32    *  ENTEROCOCCUS FAECALIS  Culture, respiratory (NON-Expectorated)     Status: None   Collection Time: 09/16/15  3:50 PM  Result Value Ref Range Status   Specimen Description TRACHEAL ASPIRATE  Final   Special Requests Immunocompromised  Final   Gram Stain   Final    FEW WBC SEEN GOOD SPECIMEN - 80-90% WBCS RARE GRAM NEGATIVE RODS    Culture LIGHT GROWTH PSEUDOMONAS AERUGINOSA  Final   Report Status 09/19/2015 FINAL  Final   Organism ID, Bacteria PSEUDOMONAS AERUGINOSA  Final      Susceptibility   Pseudomonas aeruginosa - MIC*    CEFTAZIDIME 8 SENSITIVE Sensitive     CIPROFLOXACIN 2 INTERMEDIATE Intermediate     GENTAMICIN >=16 RESISTANT Resistant     IMIPENEM >=16 RESISTANT Resistant     PIP/TAZO Value in next row Sensitive      SENSITIVE32    CEFEPIME Value in next row Sensitive      SENSITIVE8    LEVOFLOXACIN Value in next row Resistant      RESISTANT>=8    * LIGHT GROWTH PSEUDOMONAS AERUGINOSA  Culture, expectorated sputum-assessment     Status: None   Collection Time: 09/22/15  2:09 PM  Result Value Ref Range Status   Specimen Description ENDOTRACHEAL  Final   Special Requests Normal  Final   Sputum evaluation THIS SPECIMEN IS ACCEPTABLE FOR SPUTUM CULTURE  Final   Report Status 09/24/2015 FINAL  Final  Culture, respiratory (NON-Expectorated)     Status: None   Collection Time: 09/22/15  2:09 PM  Result Value Ref Range Status   Specimen Description ENDOTRACHEAL  Final   Special Requests Normal Reflexed from [id number]  Final   Gram Stain   Final    FEW WBC SEEN FEW GRAM NEGATIVE RODS POOR SPECIMEN - LESS THAN 70% WBCS    Culture   Final    MODERATE GROWTH PSEUDOMONAS AERUGINOSA LIGHT GROWTH KLEBSIELLA PNEUMONIAE REFER TO SENSITIVITIES FROM PREVIOUS CULTURE FOR ORG 2    Report Status 10/01/2015 FINAL  Final   Organism ID, Bacteria PSEUDOMONAS AERUGINOSA  Final   Organism ID, Bacteria KLEBSIELLA PNEUMONIAE   Final      Susceptibility   Pseudomonas aeruginosa - MIC*    CEFTAZIDIME 4 SENSITIVE Sensitive     CIPROFLOXACIN 2 INTERMEDIATE Intermediate     GENTAMICIN >=16 RESISTANT Resistant     IMIPENEM >=16 RESISTANT Resistant     PIP/TAZO Value in next row Sensitive      SENSITIVE16    * MODERATE GROWTH PSEUDOMONAS AERUGINOSA  Tissue culture     Status: None   Collection Time: 09/24/15  6:44 AM  Result Value Ref Range Status   Specimen Description BONE  Final   Special Requests Normal  Final   Gram Stain MODERATE WBC SEEN FEW GRAM NEGATIVE RODS   Final   Culture   Final    MODERATE GROWTH ESCHERICHIA COLI MODERATE GROWTH KLEBSIELLA PNEUMONIAE ESBL-EXTENDED SPECTRUM BETA LACTAMASE-THE ORGANISM IS RESISTANT TO PENICILLINS, CEPHALOSPORINS AND AZTREONAM ACCORDING TO CLSI M100-S15 VOL.25 N01 JAN 2005. ORGANISM 1 This organism isolate is resistant to one or more antiotic agents in three or more antimicrobial categories.  Suggest Infectious Disease consult.   ORGANISM 2 CRITICAL RESULT CALLED TO, READ BACK BY AND VERIFIED WITH: RN Mardene Celeste LINDSAY 09/27/15 1005AM    Report Status 09/28/2015 FINAL  Final   Organism ID, Bacteria ESCHERICHIA COLI  Final   Organism ID, Bacteria KLEBSIELLA PNEUMONIAE  Final      Susceptibility   Escherichia  coli - MIC*    AMPICILLIN >=32 RESISTANT Resistant     CEFTAZIDIME 4 RESISTANT Resistant     CEFAZOLIN >=64 RESISTANT Resistant     CEFTRIAXONE 8 RESISTANT Resistant     GENTAMICIN <=1 SENSITIVE Sensitive     IMIPENEM 8 RESISTANT Resistant     TRIMETH/SULFA <=20 SENSITIVE Sensitive     Extended ESBL POSITIVE Resistant     PIP/TAZO Value in next row Resistant      RESISTANT>=128    * MODERATE GROWTH ESCHERICHIA COLI   Klebsiella pneumoniae - MIC*    AMPICILLIN Value in next row Resistant      RESISTANT>=128    CEFTAZIDIME Value in next row Resistant      RESISTANT>=128    CEFAZOLIN Value in next row Resistant      RESISTANT>=128    CEFTRIAXONE Value in  next row Resistant      RESISTANT>=128    CIPROFLOXACIN Value in next row Resistant      RESISTANT>=128    GENTAMICIN Value in next row Resistant      RESISTANT>=128    IMIPENEM Value in next row Resistant      RESISTANT>=128    TRIMETH/SULFA Value in next row Sensitive      RESISTANT>=128    PIP/TAZO Value in next row Resistant      RESISTANT>=128    * MODERATE GROWTH KLEBSIELLA PNEUMONIAE  Anaerobic culture     Status: None   Collection Time: 09/24/15  3:55 PM  Result Value Ref Range Status   Specimen Description BONE  Final   Special Requests Normal  Final   Culture NO ANAEROBES ISOLATED  Final   Report Status 09/29/2015 FINAL  Final  Culture, fungus without smear     Status: None   Collection Time: 09/24/15  3:55 PM  Result Value Ref Range Status   Specimen Description BONE  Final   Special Requests Normal  Final   Culture NO FUNGUS ISOLATED AFTER 24 DAYS  Final   Report Status 10/18/2015 FINAL  Final  C difficile quick scan w PCR reflex     Status: None   Collection Time: 10/07/15  2:02 PM  Result Value Ref Range Status   C Diff antigen NEGATIVE NEGATIVE Final   C Diff toxin NEGATIVE NEGATIVE Final   C Diff interpretation Negative for C. difficile  Final  Culture, blood (routine x 2)     Status: None   Collection Time: 10/16/15  9:52 AM  Result Value Ref Range Status   Specimen Description BLOOD LEFT HAND  Final   Special Requests   Final    BOTTLES DRAWN AEROBIC AND ANAEROBIC  AER 6CC ANA 4CC   Culture NO GROWTH 6 DAYS  Final   Report Status 10/22/2015 FINAL  Final  Culture, blood (routine x 2)     Status: None   Collection Time: 10/16/15 12:25 PM  Result Value Ref Range Status   Specimen Description BLOOD LEFT HAND  Final   Special Requests BOTTLES DRAWN AEROBIC AND ANAEROBIC  5CC  Final   Culture NO GROWTH 6 DAYS  Final   Report Status 10/22/2015 FINAL  Final  Culture, expectorated sputum-assessment     Status: None   Collection Time: 10/18/15  1:15 PM   Result Value Ref Range Status   Specimen Description SPUTUM  Final   Special Requests NONE  Final   Sputum evaluation THIS SPECIMEN IS ACCEPTABLE FOR SPUTUM CULTURE  Final   Report Status 10/18/2015 FINAL  Final  Culture, respiratory (NON-Expectorated)     Status: None   Collection Time: 10/18/15  1:15 PM  Result Value Ref Range Status   Specimen Description SPUTUM  Final   Special Requests NONE Reflexed from [id number]  Final   Gram Stain   Final    GOOD SPECIMEN - 80-90% WBCS MODERATE WBC SEEN MANY GRAM NEGATIVE RODS    Culture   Final    HEAVY GROWTH PSEUDOMONAS AERUGINOSA MODERATE GROWTH KLEBSIELLA PNEUMONIAE CRITICAL RESULT CALLED TO, READ BACK BY AND VERIFIED WITHRod Mae AT 4540 10/21/15 DV KLEBSIELLA PNEUMONIAE This organism isolate is resistant to one or more antiotic agents in three or more antimicrobial categories.  Suggest Infectious Disease  consult.      Report Status 10/22/2015 FINAL  Final   Organism ID, Bacteria PSEUDOMONAS AERUGINOSA  Final   Organism ID, Bacteria KLEBSIELLA PNEUMONIAE  Final      Susceptibility   Klebsiella pneumoniae - MIC*    AMPICILLIN >=32 RESISTANT Resistant     CEFAZOLIN >=64 RESISTANT Resistant     CEFTRIAXONE >=64 RESISTANT Resistant     CIPROFLOXACIN >=4 RESISTANT Resistant     GENTAMICIN >=16 RESISTANT Resistant     IMIPENEM >=16 RESISTANT Resistant     NITROFURANTOIN >=512 RESISTANT Resistant     TRIMETH/SULFA <=20 SENSITIVE Sensitive     * MODERATE GROWTH KLEBSIELLA PNEUMONIAE   Pseudomonas aeruginosa - MIC*    CEFTAZIDIME 4 SENSITIVE Sensitive     CIPROFLOXACIN >=4 RESISTANT Resistant     GENTAMICIN 8 INTERMEDIATE Intermediate     IMIPENEM >=16 RESISTANT Resistant     PIP/TAZO Value in next row Sensitive      SENSITIVE32    * HEAVY GROWTH PSEUDOMONAS AERUGINOSA  C difficile quick scan w PCR reflex     Status: None   Collection Time: 10/18/15  4:39 PM  Result Value Ref Range Status   C Diff antigen NEGATIVE NEGATIVE  Final   C Diff toxin NEGATIVE NEGATIVE Final   C Diff interpretation Negative for C. difficile  Final  Culture, blood (Routine X 2) w Reflex to ID Panel     Status: None   Collection Time: 11/06/15  8:27 AM  Result Value Ref Range Status   Specimen Description BLOOD RIGHT HAND  Final   Special Requests BOTTLES DRAWN AEROBIC AND ANAEROBIC 3CC  Final   Culture NO GROWTH 5 DAYS  Final   Report Status 11/11/2015 FINAL  Final  Culture, blood (Routine X 2) w Reflex to ID Panel     Status: None   Collection Time: 11/06/15  8:29 AM  Result Value Ref Range Status   Specimen Description BLOOD LEFT HAND  Final   Special Requests BOTTLES DRAWN AEROBIC AND ANAEROBIC  1CC  Final   Culture NO GROWTH 5 DAYS  Final   Report Status 11/11/2015 FINAL  Final  Culture, expectorated sputum-assessment     Status: None   Collection Time: 11/06/15  8:41 AM  Result Value Ref Range Status   Specimen Description EXPECTORATED SPUTUM  Final   Special Requests Immunocompromised  Final   Sputum evaluation THIS SPECIMEN IS ACCEPTABLE FOR SPUTUM CULTURE  Final   Report Status 11/06/2015 FINAL  Final  Culture, respiratory (NON-Expectorated)     Status: None (Preliminary result)   Collection Time: 11/06/15  8:41 AM  Result Value Ref Range Status   Specimen Description EXPECTORATED SPUTUM  Final   Special Requests Immunocompromised Reflexed from [id number]  Final   Gram Stain  Final    MANY WBC SEEN MANY GRAM NEGATIVE RODS EXCELLENT SPECIMEN - 90-100% WBCS    Culture   Final    MODERATE GROWTH KLEBSIELLA PNEUMONIAE MODERATE GROWTH PSEUDOMONAS AERUGINOSA CRITICAL RESULT CALLED TO, READ BACK BY AND VERIFIED WITH: DR. FITZGERALD AT 1120 11/09/15 DV DR. FITZGERALD REQUESTED ORGANISM BE SENT OUT FOR TIGECYCLINE AND COLISTIN Sent to [facility] for further susceptibility testing. 11/11/15 CTJ    Report Status PENDING  Incomplete   Organism ID, Bacteria KLEBSIELLA PNEUMONIAE  Final   Organism ID, Bacteria PSEUDOMONAS  AERUGINOSA  Final      Susceptibility   Klebsiella pneumoniae - MIC*    AMPICILLIN >=32 RESISTANT Resistant     CEFAZOLIN >=64 RESISTANT Resistant     CEFTRIAXONE 32 RESISTANT Resistant     CIPROFLOXACIN >=4 RESISTANT Resistant     GENTAMICIN >=16 RESISTANT Resistant     IMIPENEM >=16 RESISTANT Resistant     NITROFURANTOIN 256 RESISTANT Resistant     TRIMETH/SULFA >=320 RESISTANT Resistant     Extended ESBL POSITIVE Resistant     * MODERATE GROWTH KLEBSIELLA PNEUMONIAE   Pseudomonas aeruginosa - MIC*    CEFTAZIDIME >=64 RESISTANT Resistant     CIPROFLOXACIN 2 INTERMEDIATE Intermediate     GENTAMICIN >=16 RESISTANT Resistant     IMIPENEM >=16 RESISTANT Resistant     * MODERATE GROWTH PSEUDOMONAS AERUGINOSA    Coagulation Studies: No results for input(s): LABPROT, INR in the last 72 hours.  Urinalysis: No results for input(s): COLORURINE, LABSPEC, PHURINE, GLUCOSEU, HGBUR, BILIRUBINUR, KETONESUR, PROTEINUR, UROBILINOGEN, NITRITE, LEUKOCYTESUR in the last 72 hours.  Invalid input(s): APPERANCEUR    Imaging: Dg Chest Port 1 View  11/15/2015  CLINICAL DATA:  Respiratory failure. EXAM: PORTABLE CHEST 1 VIEW COMPARISON:  11/12/2015 . FINDINGS: Tracheostomy tube, left IJ line, right IJ dual-lumen catheter in stable position. Cardiomegaly with persistent unchanged bilateral pulmonary infiltrates/edema. Tiny pleural effusions cannot be excluded. No pneumothorax. IMPRESSION: 1. Lines and tubes in stable position. 2. Cardiomegaly with persistent bilateral pulmonary infiltrates/edema, no interim change. Tiny bilateral pleural effusions cannot be excluded. Electronically Signed   By: Maisie Fus  Register   On: 11/15/2015 07:30     Medications:   . dextrose 5 % and 0.9% NaCl 10 mL/hr at 11/14/15 1718  . feeding supplement (JEVITY 1.5 CAL/FIBER) Stopped (11/13/15 1430)  . norepinephrine Stopped (11/12/15 0751)   . amiodarone  400 mg Per Tube BID  . antiseptic oral rinse  7 mL Mouth Rinse QID   . aspirin  81 mg Oral Daily  . chlorhexidine gluconate  15 mL Mouth Rinse BID  . colistimethate (COLISTIN) IV  200 mg Intravenous Q48H  . epoetin (EPOGEN/PROCRIT) injection  10,000 Units Intravenous Q M,W,F-HD  . escitalopram  10 mg Oral Daily  . feeding supplement (PRO-STAT SUGAR FREE 64)  30 mL Oral 6 X Daily  . free water  20 mL Per Tube 6 times per day  . heparin subcutaneous  5,000 Units Subcutaneous Q12H  . hydrocortisone  10 mg Per Tube BID  . lidocaine  1 patch Transdermal Q24H  . midodrine  10 mg Per Tube TID WC  . mirtazapine  7.5 mg Oral QHS  . multivitamin  5 mL Oral Daily  . pantoprazole sodium  40 mg Per Tube Daily  . saccharomyces boulardii  250 mg Oral BID  . sodium chloride  10-40 mL Intracatheter Q12H  . sodium hypochlorite   Irrigation BID  . tigecycline (TYGACIL) IVPB  50 mg Intravenous Q12H  acetaminophen (TYLENOL) oral liquid 160 mg/5 mL, HYDROmorphone (DILAUDID) injection, ipratropium-albuterol, loperamide, morphine injection, ondansetron (ZOFRAN) IV, oxyCODONE, polyvinyl alcohol, sodium chloride, zinc oxide  Assessment/ Plan:  49 y.o. 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 Admitted on 08-19-15  Echo: 11/07/19: moderately to severely reduced. The estimated ejection fraction was in the range of 30% to 35%. Regional wall motion abnormalities: Severe hypokinesis of the anterior and  anteroseptal myocardium.  1. End-stage renal disease on hemodialysis on HD MWF. The patient has been on dialysis since October of 2014. R IJ permcath. Dialysis treatment with IV albumin for oncotic support. Midodrine and hydrocortisone ordered for blood pressure support.  - Patient had hemodialysis yesterday. No acute indication for this today. She has been tolerating lower blood flow rates better. We have also used intermittent albumin though this has not been necessary recently.  2. Anemia of CKD. Hemoglobin 7.3 - Patient continues to require when necessary blood transfusion. Hemoglobin currently 7.2. Continue Epogen with dialysis.  3. Acute resp failure:  -Patient has been on the ventilator for prolonged period of time. Continue ventilatory support FiO2 currently 40%.  4. Sepsis: VRE in blood. Bone cultures E. Coli and klebsiella  - Treatment as per ID and ICU team:     5. Generalized edema/anasarca We have struggled with ultrafiltration recently. We may need to consider use of albumin again.      LOS: 95 Courtney [name] 12/27/20168:43 AM

## 2015-11-15 NOTE — Progress Notes (Signed)
PULMONARY / CRITICAL CARE MEDICINE   Name: Courtney [name] MRN: [id number] DOB: [birth year]    ADMISSION DATE:  08/06/2015  BRIEF HISTORY: 49 AAF who has been in medical facilities (hosp, LTAC, rehab) for 2 yrs following gastric bypass surgery with multiple complications. Now with chronic trach, ESRD, profound debilitation, severe sacral pressure ulcer. Was seemingly making progress and transferred to rehab facility approx one week prior to this admission. She was sent to [facility] ED with AMS and hypotension. Working dx of severe sepsis/septic shock due to infected sacral pressure ulcer. Since admission. Her course is been very complicated with numerous complications including septic shock and GI bleeding. Now with failure to wean from vent and possible MI event, repeat ECHO EF 30-35, possible old MI, severe hypokinesis of the nnterior and anteroseptal myocardium   INTERVAL HISTORY: Increased mucopurulent secretions. No distress  Summary of MAJOR EVENTS/TEST RESULTS: Admission 02/07/14-05/07/14 Admission 07/21/14-09/06/14 Discharged to [facility]. Pt had palliative consult at that time, were asked to sign off by husband.  09/23 CT head: NAD 09/23 EEG: no epileptiform activity 09/23 PRBCs for Hgb 6.4 09/24 bedside debridement of sacral wound. Abscess drained 09/25 Off vasopressors. More alert. No distress. Worsening thrombocytopenia. Vanc DC'd 09/29 Dr. Sampson Goon (I.D) excused from the case by patient's husband. 10/02 MRI -multiple infarcts 10/03 tracheal bleeding- transfused platelets 10/03 hospitalist service excused from the case by patient's husband 10/03 Echocardiogram ejection fraction was 55-60%, pulmonary systolic pressure was 39 mmHg 11/91 restart TF's at lower rate, attempt reg HD  10/12 Transferred to med-surg floor. Remains on PCCM service 10/14 SLP eval: pt unable to tolerate PMV adequately 10/17-will re-attempt PM valve-discussed with Speech therapist 10/18 passed swallow  eval-start pureed thick foods no thin liquids-continue NG feeds 10/19 transferred to step down for sepsis/aspiration pneumonia 10/19 cxr shows RLL opacity 10/21 sacral decub debride at bedside by surgery 10/22 started back on vasopressors while on HD 10/27 CT with osteo, R hip fx, unable to identify tip of dubhoff tube - sent for fluoro study 10/28 Ortho consultation: I do not feel that she is a surgical candidate. Therefore, I feel that it would best to manage this fracture nonsurgically and allow it to heal by itself over time, which it should.  10/28 Gen Surg consultation: Due to the lack of free air or free flow of contrast and the peritoneum there is no indication for any surgical intervention on this. Would recommend pulling back the feeding tube 1-2 cm. Would recheck an abdominal film to confirm no pneumoperitoneum in the morning. Absence of any changes okay to continue using Dobbhoff for feeding and medications. 10/28 gastrograffin study: The study confirms that the feeding tube pes perforated through the duodenum and the tip is within a cavity that fills with injected contrast. The cavity does appear walled off 11/4 refuses oral feeds, continue TF's CT reviewed: T8-T9 discitis/osteomyelitis, RT HIP FRACTURE 11/5 placed back on vasopressors, placed back on Vent due to resp acidosis, levophed turned off 11/6 afternoon 11/5 PM Trach changed out due to cuff leak, #6 Shiley cuffed, 11/6 dubhoff occluded, removed and replaced, new dubhoff shows tube in the antral stomach 11/15 refusing to take oral feeds 11/18 placed back on Vent for increased WOB,SOB. 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 10/18/2015; the patient developed a new left basal pneumonia; with recurrent sepsis.  10/19/2015; fevers resolved with IV acetaminophen. Tube feeds changed from vital to Jevity.  12/7 CXR with stable b/l opacities.  12/12  elevated K s/p HD-remains on AC mode 12/16-vomiting-hold feeds and re-assess in next 24 hrs 12/17- TF restarted at 1/2 goal (goal = 50cc/per) 12/18- elevated wbc, fever (101.4), recultured, restarted on ceftaz 12/19 Pericardial friction rub. Echocardiogram: LVEF 30-35%, anteroseptal hypokinesis or akinesis c/w anterior wall MI 12/20 Dr Sung AmabileSimonds discussed new echocardiogram findings with pt's husband and explained that this likely represents an anterior wall MI sometime in the previous couple of weeks. It was explained that she is not a candidate for any cardiology intervention. Code status was again discussed. Husband expressed understanding that she was not going to survive this illness in any favorable way but continues to request that she be full code status based on what he believes to be her wishes 12/21 vomitied 50cc of TF - AM TF held, restart TF at 3pm @30cc  12/26 Persistent intermittent trach cuff leak. Distal XLT trach requested.  12/27 15 beat NSVT  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 Pneumoniae, P. Mirabilis, VRE Wound 9/26 >> No growth Blood 9/23 >> NEG CDiff 9/27>>neg Stool Cx 10/15>> negative Cdiff 10/25>>neg Trach Aspirate 10/28>> light growth pseudomonas Blood 10/28 >> 1/2 GPC >>  Sputum cultures obtained 09/22/15 due to mucus plugging>>Pseudomonas BONE TISSUE Cx 11/3>>E.coli (ESBL), k. Pneumonia (daptomycin and septra) Bld Cx 11/27>>negative thus far.  Trach Asp 11/27>> grew out Pseudomonas, and Klebsiella pneumonia. Trach Asp 12/18>>PAN RESISTANT Klebsiella, Pseudomonas Bld Cx 12/18>> NEG 12/26 resp culture  >>   ANTIMICROBIALS:  Aztreonam 9/23 >> 9/24 Vanc 9/23 >> 9/25 Vanc 9/26>>9/27 Daptomycin 9/27>> 10/12, 10/28>> off Meropenem 9/23 >> 10/14, 10/28 >> 10/31 Zosyn 11/27,>> 11/30. Levaquin 11/29>> 11/29. Ceftaz 12/1>>12/11, 12/18>> 12/24 Colisitin 12/26 >>  Tigecycline 12/26 >>  Septra 11/8>> chronic   VITAL SIGNS: Temp:  [98.2 F (36.8 C)-98.9 F (37.2 C)] 98.7 F (37.1 C) (12/27 1100) Pulse Rate:  [57-74] 60 (12/27 1300) Resp:  [16-27] 17 (12/27 1300) BP: (114-137)/(38-94) 117/43 mmHg (12/27 1300) SpO2:  [97 %-100 %] 100 % (12/27 1300) FiO2 (%):  [40 %] 40 % (12/27 1217) HEMODYNAMICS:   VENTILATOR SETTINGS: Vent Mode:  [-] PRVC FiO2 (%):  [40 %] 40 % Set Rate:  [14 bmp] 14 bmp Vt Set:  [530 mL] 530 mL PEEP:  [5 cmH20] 5 cmH20 Plateau Pressure:  [23 cmH20-31 cmH20] 31 cmH20 INTAKE / OUTPUT:  Intake/Output Summary (Last 24 hours) at 11/15/15 1538 Last data filed at 11/15/15 1000  Gross per 24 hour  Intake    300 ml  Output      0 ml  Net    300 ml    Review of Systems  Unable to perform ROS  Unable to fully obtain, due to trach and critical illness Physical Exam  Vent dependent Somonlent HEENT WNL, NGT present Trach site clean Diffuse rhonchi Reg with occ extrasystoles, no M Abdomen soft, + BS Ext cool, trace symmetric edema, marked muscle wasting Severe sacral decubitus ulcer - site looked clean 12/21  LABS:  CBC  Recent Labs Lab 11/11/15 0558 11/14/15 0512 11/14/15 0855  WBC 19.3* 19.9* 19.1*  HGB 7.6* 7.3* 7.2*  HCT 25.1* 24.3* 24.1*  PLT  217 238 242   Coag's No results for input(s): APTT, INR in the last 168 hours. BMET  Recent Labs Lab 11/10/15 0933 11/11/15 0558 11/14/15 0855  NA 138 143 141  K 3.6 3.9 4.8  CL 96* 104 100*  CO2 31 33* 31  BUN 47* 61* 87*  CREATININE 1.23* 1.49* 1.71*  GLUCOSE 279* 153* 87   Electrolytes  Recent Labs Lab 11/09/15 1307 11/09/15 1534 11/10/15 0933 11/11/15 0558 11/14/15 0855   CALCIUM 8.5* 8.4* 8.1* 8.5* 8.5*  MG  --  1.8  --   --   --   PHOS 3.3 2.4*  --   --  4.7*   Sepsis Markers No results for input(s): LATICACIDVEN, PROCALCITON, O2SATVEN in the last 168 hours. ABG No results for input(s): PHART, PCO2ART, PO2ART in the last 168 hours. Liver Enzymes  Recent Labs Lab 11/09/15 1307 11/09/15 1534 11/14/15 0855  AST  --  68*  --   ALT  --  43  --   ALKPHOS  --  1677*  --   BILITOT  --  1.2  --   ALBUMIN 1.8* 2.0* 1.6*   Cardiac Enzymes  Recent Labs Lab 11/09/15 1534 11/10/15 0125 11/10/15 0930  TROPONINI 0.11* 0.08* 0.28*   Glucose  Recent Labs Lab 11/14/15 2024 11/14/15 2051 11/14/15 2212 11/15/15 0016 11/15/15 0454 11/15/15 0749  GLUCAP 62* 68 135* 103* 84 74    Imaging Dg Chest Port 1 View  11/15/2015  CLINICAL DATA:  Respiratory failure. EXAM: PORTABLE CHEST 1 VIEW COMPARISON:  11/12/2015 . FINDINGS: Tracheostomy tube, left IJ line, right IJ dual-lumen catheter in stable position. Cardiomegaly with persistent unchanged bilateral pulmonary infiltrates/edema. Tiny pleural effusions cannot be excluded. No pneumothorax. IMPRESSION: 1. Lines and tubes in stable position. 2. Cardiomegaly with persistent bilateral pulmonary infiltrates/edema, no interim change. Tiny bilateral pleural effusions cannot be excluded. Electronically Signed   By: Maisie Fus  Register   On: 11/15/2015 07:30    ASSESSMENT / PLAN: IMPRESSION: Very prolonged and complicated hospitalization after gastric bypass surgery 2014 Never returned home ESRD Recurrent severe sepsis - PAN resistant Klebisella in sputum Recurrent PNA H/O C diff H/O VTE DM2 - controlled Episodic hypoglycemia Chronic steroid therapy, for a history of of adrenal insufficiency Incidental finding of R hip fx - conservative mgmt. 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 Husband declined TEE Profound deconditioning Chronic pain New finding of pericardial friction rub 12/19 New finding of cardiomyopathy, likely post MI 12/19 Atrial flutter HCAP with MDR Klebs and pseudomonas   Cont vent support - settings reviewed and/or adjusted Cont vent bundle Daily SBT if/when meets criteria Cont HD per renal Monitor BMET intermittently Monitor I/Os Correct electrolytes as indicated DVT px: SQ heparin Monitor CBC intermittently Transfuse per usual ICU guidelines Cont TFs as tolerated Monitor temp, WBC count Micro and abx as above ID following - appreciate recs On amio gtt for A. Flutter Recheck CXR 12/26 Recheck resp culture 12/26 ID consult 12/26 Colistin and tigecycline 12/26 per last recs of ID 12/21  Billy Fischer, MD PCCM service Mobile [phone] Pager [phone]

## 2015-11-15 NOTE — Progress Notes (Addendum)
MEDICATION RELATED CONSULT NOTE - Follow up   Pharmacy Consult for Renal dosing  Allergies  Allergen Reactions  . Contrast Media [Iodinated Diagnostic Agents] Anaphylaxis  . Ampicillin Rash   Patient Measurements: Ht 375ft 9in Height:  (SCALE BROKE) Weight:  (bed scale is broken) IBW/kg (Calculated) : 66.2  Vital Signs: Temp: 98.7 F (37.1 C) (12/27 1100) Temp Source: Oral (12/27 1100) BP: 128/58 mmHg (12/27 1000) Pulse Rate: 59 (12/27 1000)  Recent Labs  11/14/15 0512 11/14/15 0855  WBC 19.9* 19.1*  HGB 7.3* 7.2*  HCT 24.3* 24.1*  PLT 238 242  CREATININE  --  1.71*  PHOS  --  4.7*  ALBUMIN  --  1.6*   Estimated Creatinine Clearance: 44.7 mL/min (by C-G formula based on Cr of 1.71).  Assessment: 49 yo patient on HD. Pharmacy consulted for renal dosing of medications.   Plan:  No medications require adjustment at present. Pharmacy will continue to monitor and dose adjust as needed.    Luisa HartScott Karuna Balducci, PharmD   11/15/2015

## 2015-11-16 LAB — CULTURE, RESPIRATORY W GRAM STAIN

## 2015-11-16 LAB — GLUCOSE, CAPILLARY
GLUCOSE-CAPILLARY: 94 mg/dL (ref 65–99)
Glucose-Capillary: 75 mg/dL (ref 65–99)
Glucose-Capillary: 62 mg/dL — ABNORMAL LOW (ref 65–99)

## 2015-11-16 MED ORDER — NOREPINEPHRINE BITARTRATE 1 MG/ML IV SOLN
0.0000 ug/min | INTRAVENOUS | Status: DC
  Filled 2015-11-16 (×2): qty 4

## 2015-11-16 MED ORDER — ALBUMIN HUMAN 25 % IV SOLN
25.0000 g | Freq: Once | INTRAVENOUS | Status: AC
  Administered 2015-11-16: 25 g via INTRAVENOUS
  Filled 2015-11-16 (×2): qty 100

## 2015-11-16 NOTE — Progress Notes (Signed)
eLink Physician-Brief Progress Note Patient Name: Lisette GrinderHedda McMillian Monroe DOB: 08-16-1966 MRN: [id number]   Date of Service  11/16/2015  HPI/Events of Note  Hypoglycemia - Blood glucose = 62. Enteral feeds on hold.   eICU Interventions  Will increase D5 0.9 NaCl IV infusion to 40 mL/hour.      Intervention Category Intermediate Interventions: Other:  Lenell AntuSommer,Jumana Paccione Eugene 11/16/2015, 8:20 PM

## 2015-11-16 NOTE — Progress Notes (Signed)
Pt vomited after receiving her meds in the feeding tube. Tube feed on hold for now.

## 2015-11-16 NOTE — Progress Notes (Signed)
ANTIBIOTIC CONSULT NOTE - INITIAL  Pharmacy Consult for Colistin and Tigecycline  Indication: MDR Respiratory Infection   Allergies  Allergen Reactions  . Contrast Media [Iodinated Diagnostic Agents] Anaphylaxis  . Ampicillin Rash   Patient Measurements: Height:  (SCALE BROKE) Weight:  (unable to weigh, bed scale broken) IBW/kg (Calculated) : 66.2  Vital Signs: Temp: 99.5 F (37.5 C) (12/28 1200) Temp Source: Axillary (12/28 0915) BP: 111/48 mmHg (12/28 1300) Pulse Rate: 89 (12/28 1300) Intake/Output from previous day: 12/27 0701 - 12/28 0700 In: 1170 [I.V.:260; NG/GT:590; IV Piggyback:200] Out: -  Intake/Output from this shift: Total I/O In: 200 [I.V.:30; NG/GT:170] Out: -   Recent Labs  11/14/15 0512 11/14/15 0855  WBC 19.9* 19.1*  HGB 7.3* 7.2*  PLT 238 242  CREATININE  --  1.71*   Estimated Creatinine Clearance: 44.7 mL/min (by C-G formula based on Cr of 1.71). No results for input(s): VANCOTROUGH, VANCOPEAK, VANCORANDOM, GENTTROUGH, GENTPEAK, GENTRANDOM, TOBRATROUGH, TOBRAPEAK, TOBRARND, AMIKACINPEAK, AMIKACINTROU, AMIKACIN in the last 72 hours.   Microbiology: Recent Results (from the past 720 hour(s))  Culture, expectorated sputum-assessment     Status: None   Collection Time: 10/18/15  1:15 PM  Result Value Ref Range Status   Specimen Description SPUTUM  Final   Special Requests NONE  Final   Sputum evaluation THIS SPECIMEN IS ACCEPTABLE FOR SPUTUM CULTURE  Final   Report Status 10/18/2015 FINAL  Final  Culture, respiratory (NON-Expectorated)     Status: None   Collection Time: 10/18/15  1:15 PM  Result Value Ref Range Status   Specimen Description SPUTUM  Final   Special Requests NONE Reflexed from [id number]  Final   Gram Stain   Final    GOOD SPECIMEN - 80-90% WBCS MODERATE WBC SEEN MANY GRAM NEGATIVE RODS    Culture   Final    HEAVY GROWTH PSEUDOMONAS AERUGINOSA MODERATE GROWTH KLEBSIELLA PNEUMONIAE CRITICAL RESULT CALLED TO, READ BACK BY AND  VERIFIED WITHRod Mae AT 1610 10/21/15 DV KLEBSIELLA PNEUMONIAE This organism isolate is resistant to one or more antiotic agents in three or more antimicrobial categories.  Suggest Infectious Disease  consult.      Report Status 10/22/2015 FINAL  Final   Organism ID, Bacteria PSEUDOMONAS AERUGINOSA  Final   Organism ID, Bacteria KLEBSIELLA PNEUMONIAE  Final      Susceptibility   Klebsiella pneumoniae - MIC*    AMPICILLIN >=32 RESISTANT Resistant     CEFAZOLIN >=64 RESISTANT Resistant     CEFTRIAXONE >=64 RESISTANT Resistant     CIPROFLOXACIN >=4 RESISTANT Resistant     GENTAMICIN >=16 RESISTANT Resistant     IMIPENEM >=16 RESISTANT Resistant     NITROFURANTOIN >=512 RESISTANT Resistant     TRIMETH/SULFA <=20 SENSITIVE Sensitive     * MODERATE GROWTH KLEBSIELLA PNEUMONIAE   Pseudomonas aeruginosa - MIC*    CEFTAZIDIME 4 SENSITIVE Sensitive     CIPROFLOXACIN >=4 RESISTANT Resistant     GENTAMICIN 8 INTERMEDIATE Intermediate     IMIPENEM >=16 RESISTANT Resistant     PIP/TAZO Value in next row Sensitive      SENSITIVE32    * HEAVY GROWTH PSEUDOMONAS AERUGINOSA  C difficile quick scan w PCR reflex     Status: None   Collection Time: 10/18/15  4:39 PM  Result Value Ref Range Status   C Diff antigen NEGATIVE NEGATIVE Final   C Diff toxin NEGATIVE NEGATIVE Final   C Diff interpretation Negative for C. difficile  Final  Culture, blood (Routine X 2)  w Reflex to ID Panel     Status: None   Collection Time: 11/06/15  8:27 AM  Result Value Ref Range Status   Specimen Description BLOOD RIGHT HAND  Final   Special Requests BOTTLES DRAWN AEROBIC AND ANAEROBIC 3CC  Final   Culture NO GROWTH 5 DAYS  Final   Report Status 11/11/2015 FINAL  Final  Culture, blood (Routine X 2) w Reflex to ID Panel     Status: None   Collection Time: 11/06/15  8:29 AM  Result Value Ref Range Status   Specimen Description BLOOD LEFT HAND  Final   Special Requests BOTTLES DRAWN AEROBIC AND ANAEROBIC  1CC   Final   Culture NO GROWTH 5 DAYS  Final   Report Status 11/11/2015 FINAL  Final  Culture, expectorated sputum-assessment     Status: None   Collection Time: 11/06/15  8:41 AM  Result Value Ref Range Status   Specimen Description EXPECTORATED SPUTUM  Final   Special Requests Immunocompromised  Final   Sputum evaluation THIS SPECIMEN IS ACCEPTABLE FOR SPUTUM CULTURE  Final   Report Status 11/06/2015 FINAL  Final  Culture, respiratory (NON-Expectorated)     Status: None   Collection Time: 11/06/15  8:41 AM  Result Value Ref Range Status   Specimen Description EXPECTORATED SPUTUM  Final   Special Requests Immunocompromised Reflexed from [id number]  Final   Gram Stain   Final    MANY WBC SEEN MANY GRAM NEGATIVE RODS EXCELLENT SPECIMEN - 90-100% WBCS    Culture   Final    MODERATE GROWTH KLEBSIELLA PNEUMONIAE MODERATE GROWTH PSEUDOMONAS AERUGINOSA CRITICAL RESULT CALLED TO, READ BACK BY AND VERIFIED WITH: DR. FITZGERALD AT 1120 11/09/15 DV DR. FITZGERALD REQUESTED ORGANISM BE SENT OUT FOR TIGECYCLINE CEFT/AVIBACTAM AND COLISTIN Sent to [facility] for further susceptibility testing. 11/11/15 CTJ    Report Status 11/15/2015 FINAL  Final   Organism ID, Bacteria KLEBSIELLA PNEUMONIAE  Final   Organism ID, Bacteria PSEUDOMONAS AERUGINOSA  Final      Susceptibility   Klebsiella pneumoniae - MIC*    AMPICILLIN >=32 RESISTANT Resistant     CEFAZOLIN >=64 RESISTANT Resistant     CEFTRIAXONE 32 RESISTANT Resistant     CIPROFLOXACIN >=4 RESISTANT Resistant     GENTAMICIN >=16 RESISTANT Resistant     IMIPENEM >=16 RESISTANT Resistant     NITROFURANTOIN 256 RESISTANT Resistant     TRIMETH/SULFA >=320 RESISTANT Resistant     Extended ESBL POSITIVE Resistant     * MODERATE GROWTH KLEBSIELLA PNEUMONIAE   Pseudomonas aeruginosa - MIC*    CEFTAZIDIME >=64 RESISTANT Resistant     CIPROFLOXACIN 2 INTERMEDIATE Intermediate     GENTAMICIN >=16 RESISTANT Resistant     IMIPENEM >=16 RESISTANT Resistant      * MODERATE GROWTH PSEUDOMONAS AERUGINOSA  Culture, respiratory (NON-Expectorated)     Status: None   Collection Time: 11/14/15  2:07 PM  Result Value Ref Range Status   Specimen Description TRACHEAL ASPIRATE  Final   Special Requests NONE  Final   Gram Stain   Final    EXCELLENT SPECIMEN - 90-100% WBCS MANY WBC SEEN MODERATE GRAM NEGATIVE RODS    Culture   Final    HEAVY GROWTH PSEUDOMONAS AERUGINOSA CRITICAL VALUE NOTED.  VALUE IS CONSISTENT WITH PREVIOUSLY REPORTED AND CALLED VALUE.    Report Status 11/16/2015 FINAL  Final   Organism ID, Bacteria PSEUDOMONAS AERUGINOSA  Final      Susceptibility   Pseudomonas aeruginosa - MIC*  CEFTAZIDIME >=64 RESISTANT Resistant     CIPROFLOXACIN 2 INTERMEDIATE Intermediate     GENTAMICIN >=16 RESISTANT Resistant     IMIPENEM >=16 RESISTANT Resistant     PIP/TAZO Value in next row Resistant      RESISTANT>=128    TOBRAMYCIN Value in next row Sensitive      SENSITIVE2    * HEAVY GROWTH PSEUDOMONAS AERUGINOSA    Medical History: Past Medical History  Diagnosis Date  . Obesity   . Dyslipidemia   . Hypertension   . Coronary artery disease     s/p BMS 2010 LAD  . Dysrhythmia     ventricular tachycardia resolved after LAD stent and beta blocker  . Diabetes mellitus     with retinopathy, neuropathy and microalbuminemia  . ESRD (end stage renal disease) on dialysis    Assessment: 49 yo female with chronic trach, ESRD, and  profound debilitation, needing treatment for MDR Respiratory infection. Patient is on day 80 of hospital stay, and has been treated for multiple infections during this time.   11/06/15: Respiratory cultures showing Pan-resistant  Klebsiella pneumoniae and Pseudomonas aeruginosa  Sputum has been sent to [facility] for susceptibility testing per Dr. Jarrett Ables request.   Patient has increased respiratory secretions, leukocytosis and low grade fever   Plan:  Continue tigecycline  IV q12h.   Continue  colistimethate  (2.5 mg/kg) IV q48 hours.   Patient should be monitored for nephrotoxicity and neurotoxicity.  Pharmacy to continue to follow per consult.      Luisa Hart, PharmD   11/16/2015,1:13 PM

## 2015-11-16 NOTE — Progress Notes (Signed)
PULMONARY / CRITICAL CARE MEDICINE   Name: Courtney GrinderHedda McMillian Monroe MRN: [id number] DOB: [birth year]    ADMISSION DATE:  08/06/2015  BRIEF HISTORY: 49 AAF who has been in medical facilities (hosp, LTAC, rehab) for 2 yrs following gastric bypass surgery with multiple complications. Now with chronic trach, ESRD, profound debilitation, severe sacral pressure ulcer. Was seemingly making progress and transferred to rehab facility approx one week prior to this admission. She was sent [facility] ED with AMS and hypotension. Working dx of severe sepsis/septic shock due to infected sacral pressure ulcer. Since admission. Her course is been very complicated with numerous complications including septic shock and GI bleeding. Now with failure to wean from vent and possible MI event, repeat ECHO EF 30-35, possible old MI, severe hypokinesis of the nnterior and anteroseptal myocardium   INTERVAL HISTORY: Increased mucopurulent secretions. Still remains on vent, +distress from pain, refused bath time and refused HD Placed on HD today  Summary of MAJOR EVENTS/TEST RESULTS: Admission 02/07/14-05/07/14 Admission 07/21/14-09/06/14 Discharged to [facility]. Pt had palliative consult at that time, were asked to sign off by husband.  09/23 CT head: NAD 09/23 EEG: no epileptiform activity 09/23 PRBCs for Hgb 6.4 09/24 bedside debridement of sacral wound. Abscess drained 09/25 Off vasopressors. More alert. No distress. Worsening thrombocytopenia. Vanc DC'd 09/29 Dr. [name] (I.D) excused from the case by patient's husband. 10/02 MRI -multiple infarcts 10/03 tracheal bleeding- transfused platelets 10/03 hospitalist service excused from the case by patient's husband 10/03 Echocardiogram ejection fraction was 55-60%, pulmonary systolic pressure was 39 mmHg 40/9810/08 restart TF's at lower rate, attempt reg HD  10/12 Transferred to med-surg floor. Remains on PCCM service 10/14 SLP eval: pt unable to tolerate PMV  adequately 10/17-will re-attempt PM valve-discussed with Speech therapist 10/18 passed swallow eval-start pureed thick foods no thin liquids-continue NG feeds 10/19 transferred to step down for sepsis/aspiration pneumonia 10/19 cxr shows RLL opacity 10/21 sacral decub debride at bedside by surgery 10/22 started back on vasopressors while on HD 10/27 CT with osteo, R hip fx, unable to identify tip of dubhoff tube - sent for fluoro study 10/28 Ortho consultation: I do not feel that she is a surgical candidate. Therefore, I feel that it would best to manage this fracture nonsurgically and allow it to heal by itself over time, which it should.  10/28 Gen Surg consultation: Due to the lack of free air or free flow of contrast and the peritoneum there is no indication for any surgical intervention on this. Would recommend pulling back the feeding tube 1-2 cm. Would recheck an abdominal film to confirm no pneumoperitoneum in the morning. Absence of any changes okay to continue using Dobbhoff for feeding and medications. 10/28 gastrograffin study: The study confirms that the feeding tube pes perforated through the duodenum and the tip is within a cavity that fills with injected contrast. The cavity does appear walled off 11/4 refuses oral feeds, continue TF's CT reviewed: T8-T9 discitis/osteomyelitis, RT HIP FRACTURE 11/5 placed back on vasopressors, placed back on Vent due to resp acidosis, levophed turned off 11/6 afternoon 11/5 PM Trach changed out due to cuff leak, #6 Shiley cuffed, 11/6 dubhoff occluded, removed and replaced, new dubhoff shows tube in the antral stomach 11/15 refusing to take oral feeds 11/18 placed back on Vent for increased WOB,SOB. 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 10/18/2015; the patient developed a new left basal pneumonia; with recurrent sepsis.  10/19/2015; fevers resolved with IV  acetaminophen. Tube feeds changed from vital to Jevity.  12/7 CXR with stable b/l opacities.  12/12 elevated K s/p HD-remains on AC mode 12/16-vomiting-hold feeds and re-assess in next 24 hrs 12/17- TF restarted at 1/2 goal (goal = 50cc/per) 12/18- elevated wbc, fever (101.4), recultured, restarted on ceftaz 12/19 Pericardial friction rub. Echocardiogram: LVEF 30-35%, anteroseptal hypokinesis or akinesis c/w anterior wall MI 12/20 Dr Sung Amabile discussed new echocardiogram findings with pt's husband and explained that this likely represents an anterior wall MI sometime in the previous couple of weeks. It was explained that she is not a candidate for any cardiology intervention. Code status was again discussed. Husband expressed understanding that she was not going to survive this illness in any favorable way but continues to request that she be full code status based on what he believes to be her wishes 12/21 vomitied 50cc of TF - AM TF held, restart TF at 3pm  12/26 Persistent intermittent trach cuff leak. Distal XLT trach requested.  12/27 15 beat NSVT 12/28 patient refused HD and refused Bath time  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 Pneumoniae, P. Mirabilis, VRE Wound 9/26 >> No growth Blood 9/23 >> NEG CDiff 9/27>>neg Stool Cx 10/15>> negative Cdiff 10/25>>neg Trach Aspirate 10/28>> light growth pseudomonas Blood 10/28 >> 1/2 GPC >>  Sputum cultures obtained 09/22/15 due to mucus plugging>>Pseudomonas BONE TISSUE Cx 11/3>>E.coli (ESBL), k. Pneumonia (daptomycin and septra) Bld Cx 11/27>>negative thus far.  Trach Asp  11/27>> grew out Pseudomonas, and Klebsiella pneumonia. Trach Asp 12/18>>PAN RESISTANT Klebsiella, Pseudomonas Bld Cx 12/18>> NEG 12/26 resp culture >> pseudomonas  ANTIMICROBIALS:  Aztreonam 9/23 >> 9/24 Vanc 9/23 >> 9/25 Vanc 9/26>>9/27 Daptomycin 9/27>> 10/12, 10/28>> off Meropenem 9/23 >> 10/14, 10/28 >> 10/31 Zosyn 11/27,>> 11/30. Levaquin 11/29>> 11/29. Ceftaz 12/1>>12/11, 12/18>> 12/24 Colisitin 12/26 >>  Tigecycline 12/26 >>  Septra 11/8>> chronic   VITAL SIGNS: Temp:  [98.3 F (36.8 C)-100.3 F (37.9 C)] 99.5 F (37.5 C) (12/28 1200) Pulse Rate:  [63-89] 89 (12/28 1315) Resp:  [0-34] 31 (12/28 1315) BP: (90-139)/(32-71) 101/42 mmHg (12/28 1315) SpO2:  [94 %-100 %] 99 % (12/28 0926) FiO2 (%):  [40 %] 40 % (12/28 1200) HEMODYNAMICS:   VENTILATOR SETTINGS: Vent Mode:  [-] PRVC FiO2 (%):  [40 %] 40 % Set Rate:  [14 bmp] 14 bmp Vt Set:  [530 mL] 530 mL PEEP:  [5 cmH20] 5 cmH20 Plateau Pressure:  [35 cmH20] 35 cmH20 INTAKE / OUTPUT:  Intake/Output Summary (Last 24 hours) at 11/16/15 1320 Last data filed at 11/16/15 1000  Gross per 24 hour  Intake   1210 ml  Output      0 ml  Net   1210 ml    Review of Systems  Unable to perform ROS  Unable to fully obtain, due to trach and critical illness Physical Exam  Vent dependent Somonlent HEENT WNL, NGT present Trach site clean Diffuse rhonchi Reg with occ extrasystoles, no M Abdomen soft, + BS Ext cool, trace symmetric edema, marked muscle wasting Severe sacral decubitus ulcer -   LABS:  CBC  Recent Labs Lab 11/11/15 0558 11/14/15 0512 11/14/15 0855  WBC 19.3* 19.9* 19.1*  HGB 7.6* 7.3* 7.2*  HCT 25.1* 24.3* 24.1*  PLT 217 238 242   Coag's No results for input(s): APTT, INR in the last 168 hours. BMET  Recent Labs Lab 11/10/15 0933 11/11/15 0558 11/14/15 0855  NA 138 143 141  K 3.6 3.9 4.8  CL 96* 104 100*  CO2 31 33* 31  BUN 47* 61* 87*  CREATININE 1.23* 1.49* 1.71*  GLUCOSE 279*  153* 87   Electrolytes  Recent Labs Lab 11/09/15 1534 11/10/15 0933 11/11/15 0558 11/14/15 0855  CALCIUM 8.4* 8.1* 8.5* 8.5*  MG 1.8  --   --   --   PHOS 2.4*  --   --  4.7*   Sepsis Markers No results for input(s): LATICACIDVEN, PROCALCITON, O2SATVEN in the last 168 hours. ABG No results for input(s): PHART, PCO2ART, PO2ART in the last 168 hours. Liver Enzymes  Recent Labs Lab 11/09/15 1534 11/14/15 0855  AST 68*  --   ALT 43  --   ALKPHOS 1677*  --   BILITOT 1.2  --   ALBUMIN 2.0* 1.6*   Cardiac Enzymes  Recent Labs Lab 11/09/15 1534 11/10/15 0125 11/10/15 0930  TROPONINI 0.11* 0.08* 0.28*   Glucose  Recent Labs Lab 11/15/15 0016 11/15/15 0454 11/15/15 0749 11/15/15 1617 11/15/15 2346 11/16/15 0717  GLUCAP 103* 84 74 80 95 94    Imaging No results found.  ASSESSMENT / PLAN: IMPRESSION: Very prolonged and complicated hospitalization after gastric bypass surgery 2014 Never returned home ESRD Recurrent severe sepsis - PAN resistant Klebisella in sputum Recurrent PNA-now with pseudomonas H/O C diff H/O VTE DM2 - controlled Episodic hypoglycemia Chronic steroid therapy, for a history of of adrenal insufficiency Incidental finding of R hip fx - conservative mgmt. 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 Husband declined TEE Profound deconditioning Chronic pain New finding of pericardial friction rub 12/19 New finding of cardiomyopathy, likely post MI 12/19 Atrial flutter HCAP with MDR Klebs and pseudomonas   Cont vent support - settings reviewed and/or adjusted Cont vent bundle Daily SBT if/when meets criteria Cont HD per renal Monitor BMET intermittently Monitor I/Os Correct electrolytes as indicated DVT px:  SQ heparin Monitor CBC intermittently Transfuse per usual ICU guidelines Cont TFs as tolerated Monitor temp, WBC count Micro and abx as above ID following - appreciate recs On oral amiodarone gtt for A. Flutter Recheck CXR 12/26 Recheck resp culture 12/26 ID consult 12/26 Colistin and tigecycline 12/26 per last recs of ID 12/21 Pain meds as needed  The Patient requires high complexity decision making for assessment and support, frequent evaluation and titration of therapies, application of advanced monitoring technologies and extensive interpretation of multiple databases. Critical Care Time devoted to patient care services described in this note is 45 minutes.   Overall, patient is critically ill, prognosis is guarded.  Patient with Multiorgan failure and at high risk for cardiac arrest and death.    Lucie Leather, M.D.  [facility]  Medical Director [facility] Medical Director [facility] Cardio-Pulmonary Department

## 2015-11-16 NOTE — Progress Notes (Signed)
Dialysis complete.  [name] at bedside. Pt's bp is lower that what the husband wants.  Dr Belia Hemankasa informed.  There was an albumin order for during HD, but HD nurse did not feel it was needed.  Dr Belia Hemankasa states to give albumin now. If no improvkent, we will start Levo.  No albumin in pyxis.  Charge nurse aware and is contacting pharmacy.  [name] is aware.

## 2015-11-16 NOTE — Progress Notes (Signed)
BG trending down as tube feeds held due to vomiting this am . E link contacted and informed and IV fluids increased as a result. Pt to remain off tube feeds for am until re eval in am

## 2015-11-16 NOTE — Progress Notes (Signed)
Pt underwent HD as prescribed without any problems.  Vitals remained stable throughout tx.  UF goal achieved as prescribed.  Denies any pain or any other discomforts at this time. Report given to ICU nurse. Pt stable.

## 2015-11-16 NOTE — Progress Notes (Signed)
Picked up care from Courtney [name]. Husband concerned regarding blood pressure. MAP 65. Will call ICU pager so husband can voice concern.

## 2015-11-16 NOTE — Progress Notes (Signed)
MEDICATION RELATED CONSULT NOTE - Follow up   Pharmacy Consult for Renal dosing  Allergies  Allergen Reactions  . Contrast Media [Iodinated Diagnostic Agents] Anaphylaxis  . Ampicillin Rash   Patient Measurements: Ht 85ft 9in Height:  (SCALE BROKE) Weight:  (unable to weigh, bed scale broken) IBW/kg (Calculated) : 66.2  Vital Signs: Temp: 99.5 F (37.5 C) (12/28 1200) Temp Source: Axillary (12/28 0915) BP: 111/48 mmHg (12/28 1300) Pulse Rate: 89 (12/28 1300)  Recent Labs  11/14/15 0512 11/14/15 0855  WBC 19.9* 19.1*  HGB 7.3* 7.2*  HCT 24.3* 24.1*  PLT 238 242  CREATININE  --  1.71*  PHOS  --  4.7*  ALBUMIN  --  1.6*   Estimated Creatinine Clearance: 44.7 mL/min (by C-G formula based on Cr of 1.71).  Assessment: 49 yo patient on HD. Pharmacy consulted for renal dosing of medications.   Plan:  No medications require adjustment at present. Pharmacy will continue to monitor and dose adjust as needed.    Luisa HartScott Randye Treichler, PharmD   11/16/2015

## 2015-11-16 NOTE — Progress Notes (Signed)
Subjective:  Patient seen at bedside. Remains on the ventilator. Due for hemodialysis today. Blood pressure currently 99/57.  Objective:  Vital signs in last 24 hours:  Temp:  [98.3 F (36.8 C)-100.3 F (37.9 C)] 99.9 F (37.7 C) (12/28 0300) Pulse Rate:  [59-75] 70 (12/28 0600) Resp:  [0-28] 0 (12/28 0600) BP: (99-139)/(35-71) 99/57 mmHg (12/28 0600) SpO2:  [94 %-100 %] 99 % (12/28 0600) FiO2 (%):  [40 %] 40 % (12/28 0429)  Weight change:  Filed Weights    Intake/Output: I/O last 3 completed shifts: In: 1250 [I.V.:370; Other:120; NG/GT:560; IV Piggyback:200] Out: -    Intake/Output this shift:     Physical Exam: General: critically ill appearing  Head/ENT: OM moist, NG tube  Eyes: Anicteric, eyes open  Neck: Tracheostomy in place  Lungs:  bilatearl rhonchi, vent assisted fio2 40%  Heart: S1S2 no rubs  Abdomen:  Soft, nontender, BS present  Extremities: 2+ dependent edema, anasarca  Neurologic: Awake, alert, nods yes/no  Access:  R IJ permcath.       Basic Metabolic Panel:  Recent Labs Lab 11/09/15 1307 11/09/15 1534 11/10/15 0933 11/11/15 0558 11/14/15 0855  NA 144 143 138 143 141  K 4.3 3.4* 3.6 3.9 4.8  CL 105 100* 96* 104 100*  CO2 33* 33* 31 33* 31  GLUCOSE 98 92 279* 153* 87  BUN 64* 39* 47* 61* 87*  CREATININE 1.41* 0.85 1.23* 1.49* 1.71*  CALCIUM 8.5* 8.4* 8.1* 8.5* 8.5*  MG  --  1.8  --   --   --   PHOS 3.3 2.4*  --   --  4.7*    Liver Function Tests:  Recent Labs Lab 11/09/15 1307 11/09/15 1534 11/14/15 0855  AST  --  68*  --   ALT  --  43  --   ALKPHOS  --  1677*  --   BILITOT  --  1.2  --   PROT  --  6.1*  --   ALBUMIN 1.8* 2.0* 1.6*   No results for input(s): LIPASE, AMYLASE in the last 168 hours. No results for input(s): AMMONIA in the last 168 hours.  CBC:  Recent Labs Lab 11/09/15 1307 11/10/15 0930 11/11/15 0558 11/14/15 0512 11/14/15 0855  WBC 17.7* 23.1* 19.3* 19.9* 19.1*  NEUTROABS  --   --  16.7*  --    --   HGB 7.6* 7.9* 7.6* 7.3* 7.2*  HCT 25.9* 26.5* 25.1* 24.3* 24.1*  MCV 97.0 95.4 96.6 94.3 94.9  PLT 216 230 217 238 242    Cardiac Enzymes:  Recent Labs Lab 11/09/15 1534 11/10/15 0125 11/10/15 0930  CKTOTAL 5* 5*  --   TROPONINI 0.11* 0.08* 0.28*    BNP: Invalid input(s): POCBNP  CBG:  Recent Labs Lab 11/15/15 0454 11/15/15 0749 11/15/15 1617 11/15/15 2346 11/16/15 0717  GLUCAP 84 74 80 95 94    Microbiology: Results for orders placed or performed during the hospital encounter of August 28, 2015  Blood Culture (routine x 2)     Status: None   Collection Time: Aug 28, 2015  8:51 AM  Result Value Ref Range Status   Specimen Description BLOOD [name]  Final   Special Requests BOTTLES DRAWN AEROBIC AND ANAEROBIC  3CC  Final   Culture NO GROWTH 5 DAYS  Final   Report Status 08/17/2015 FINAL  Final  Blood Culture (routine x 2)     Status: None   Collection Time: 08/28/2015  9:20 AM  Result Value Ref Range Status  Specimen Description BLOOD LEFT ARM  Final   Special Requests BOTTLES DRAWN AEROBIC AND ANAEROBIC  1CC  Final   Culture NO GROWTH 5 DAYS  Final   Report Status 08/17/2015 FINAL  Final  Wound culture     Status: None   Collection Time: 07/31/2015  9:20 AM  Result Value Ref Range Status   Specimen Description DECUBITIS  Final   Special Requests Normal  Final   Gram Stain   Final    FEW WBC SEEN MANY GRAM NEGATIVE RODS RARE GRAM POSITIVE COCCI    Culture   Final    HEAVY GROWTH ESCHERICHIA COLI MODERATE GROWTH ENTEROBACTER AEROGENES PROTEUS MIRABILIS HEAVY GROWTH ENTEROCOCCUS SPECIES VRE HAVE INTRINSIC RESISTANCE TO MOST COMMONLY USED ANTIBIOTICS AND THE ABILITY TO ACQUIRE RESISTANCE TO MOST AVAILABLE ANTIBIOTICS.    Report Status 08/16/2015 FINAL  Final   Organism ID, Bacteria ESCHERICHIA COLI  Final   Organism ID, Bacteria ENTEROBACTER AEROGENES  Final   Organism ID, Bacteria PROTEUS MIRABILIS  Final   Organism ID, Bacteria ENTEROCOCCUS SPECIES  Final       Susceptibility   Enterobacter aerogenes - MIC*    CEFTAZIDIME <=1 SENSITIVE Sensitive     CEFAZOLIN >=64 RESISTANT Resistant     CEFTRIAXONE <=1 SENSITIVE Sensitive     CIPROFLOXACIN <=0.25 SENSITIVE Sensitive     GENTAMICIN <=1 SENSITIVE Sensitive     IMIPENEM 1 SENSITIVE Sensitive     TRIMETH/SULFA <=20 SENSITIVE Sensitive     * MODERATE GROWTH ENTEROBACTER AEROGENES   Escherichia coli - MIC*    AMPICILLIN <=2 SENSITIVE Sensitive     CEFTAZIDIME <=1 SENSITIVE Sensitive     CEFAZOLIN <=4 SENSITIVE Sensitive     CEFTRIAXONE <=1 SENSITIVE Sensitive     CIPROFLOXACIN <=0.25 SENSITIVE Sensitive     GENTAMICIN <=1 SENSITIVE Sensitive     IMIPENEM <=0.25 SENSITIVE Sensitive     TRIMETH/SULFA <=20 SENSITIVE Sensitive     * HEAVY GROWTH ESCHERICHIA COLI   Proteus mirabilis - MIC*    AMPICILLIN >=32 RESISTANT Resistant     CEFTAZIDIME <=1 SENSITIVE Sensitive     CEFAZOLIN 8 SENSITIVE Sensitive     CEFTRIAXONE <=1 SENSITIVE Sensitive     CIPROFLOXACIN <=0.25 SENSITIVE Sensitive     GENTAMICIN <=1 SENSITIVE Sensitive     IMIPENEM 1 SENSITIVE Sensitive     TRIMETH/SULFA <=20 SENSITIVE Sensitive     * PROTEUS MIRABILIS   Enterococcus species - MIC*    AMPICILLIN >=32 RESISTANT Resistant     VANCOMYCIN >=32 RESISTANT Resistant     GENTAMICIN SYNERGY SENSITIVE Sensitive     TETRACYCLINE Value in next row Resistant      RESISTANT>=16    * HEAVY GROWTH ENTEROCOCCUS SPECIES  MRSA PCR Screening     Status: None   Collection Time: 08/13/2015  2:38 PM  Result Value Ref Range Status   MRSA by PCR NEGATIVE NEGATIVE Final    Comment:        The GeneXpert MRSA Assay (FDA approved for NASAL specimens only), is one component of a comprehensive MRSA colonization surveillance program. It is not intended to diagnose MRSA infection nor to guide or monitor treatment for MRSA infections.   Blood culture (single)     Status: None   Collection Time: 08/19/2015  3:36 PM  Result Value Ref Range Status    Specimen Description BLOOD RIGHT ASSIST CONTROL  Final   Special Requests BOTTLES DRAWN AEROBIC AND ANAEROBIC  Final   Culture NO GROWTH  5 DAYS  Final   Report Status 08/17/2015 FINAL  Final  Wound culture     Status: None   Collection Time: 08/13/15 12:37 PM  Result Value Ref Range Status   Specimen Description WOUND  Final   Special Requests Normal  Final   Gram Stain   Final    FEW WBC SEEN TOO NUMEROUS TO COUNT GRAM NEGATIVE RODS FEW GRAM POSITIVE COCCI    Culture   Final    HEAVY GROWTH ESCHERICHIA COLI MODERATE GROWTH PROTEUS MIRABILIS LIGHT GROWTH KLEBSIELLA PNEUMONIAE MODERATE GROWTH ENTEROCOCCUS GALLINARUM CRITICAL RESULT CALLED TO, READ BACK BY AND VERIFIED WITH: [facility]ORBA AT 1042 08/16/15 DV    Report Status 08/17/2015 FINAL  Final   Organism ID, Bacteria ESCHERICHIA COLI  Final   Organism ID, Bacteria PROTEUS MIRABILIS  Final   Organism ID, Bacteria KLEBSIELLA PNEUMONIAE  Final   Organism ID, Bacteria ENTEROCOCCUS GALLINARUM  Final      Susceptibility   Escherichia coli - MIC*    AMPICILLIN >=32 RESISTANT Resistant     CEFTAZIDIME 4 RESISTANT Resistant     CEFAZOLIN >=64 RESISTANT Resistant     CEFTRIAXONE 16 RESISTANT Resistant     GENTAMICIN 2 SENSITIVE Sensitive     IMIPENEM >=16 RESISTANT Resistant     TRIMETH/SULFA <=20 SENSITIVE Sensitive     Extended ESBL POSITIVE Resistant     PIP/TAZO Value in next row Resistant      RESISTANT>=128    CIPROFLOXACIN Value in next row Sensitive      SENSITIVE<=0.25    * HEAVY GROWTH ESCHERICHIA COLI   Klebsiella pneumoniae - MIC*    AMPICILLIN Value in next row Resistant      SENSITIVE<=0.25    CEFTAZIDIME Value in next row Resistant      SENSITIVE<=0.25    CEFAZOLIN Value in next row Resistant      SENSITIVE<=0.25    CEFTRIAXONE Value in next row Resistant      SENSITIVE<=0.25    CIPROFLOXACIN Value in next row Resistant      SENSITIVE<=0.25    GENTAMICIN Value in next row Sensitive       SENSITIVE<=0.25    IMIPENEM Value in next row Resistant      SENSITIVE<=0.25    TRIMETH/SULFA Value in next row Resistant      SENSITIVE<=0.25    PIP/TAZO Value in next row Resistant      RESISTANT>=128    * LIGHT GROWTH KLEBSIELLA PNEUMONIAE   Proteus mirabilis - MIC*    AMPICILLIN Value in next row Resistant      RESISTANT>=128    CEFTAZIDIME Value in next row Sensitive      RESISTANT>=128    CEFAZOLIN Value in next row Sensitive      RESISTANT>=128    CEFTRIAXONE Value in next row Sensitive      RESISTANT>=128    CIPROFLOXACIN Value in next row Sensitive      RESISTANT>=128    GENTAMICIN Value in next row Sensitive      RESISTANT>=128    IMIPENEM Value in next row Sensitive      RESISTANT>=128    TRIMETH/SULFA Value in next row Sensitive      RESISTANT>=128    PIP/TAZO Value in next row Sensitive      SENSITIVE<=4    * MODERATE GROWTH PROTEUS MIRABILIS   Enterococcus gallinarum - MIC*    AMPICILLIN Value in next row Resistant      SENSITIVE<=4    GENTAMICIN SYNERGY Value in  next row Sensitive      SENSITIVE<=4    CIPROFLOXACIN Value in next row Resistant      RESISTANT>=8    TETRACYCLINE Value in next row Resistant      RESISTANT>=16    * MODERATE GROWTH ENTEROCOCCUS GALLINARUM  Wound culture     Status: None   Collection Time: 08/15/15  2:53 PM  Result Value Ref Range Status   Specimen Description WOUND  Final   Special Requests NONE  Final   Gram Stain FEW WBC SEEN NO ORGANISMS SEEN   Final   Culture NO GROWTH 3 DAYS  Final   Report Status 08/18/2015 FINAL  Final  C difficile quick scan w PCR reflex     Status: None   Collection Time: 08/17/15 11:34 AM  Result Value Ref Range Status   C Diff antigen NEGATIVE NEGATIVE Final   C Diff toxin NEGATIVE NEGATIVE Final   C Diff interpretation Negative for C. difficile  Final  Stool culture     Status: None   Collection Time: 09/03/15  3:51 PM  Result Value Ref Range Status   Specimen Description STOOL  Final    Special Requests Immunocompromised  Final   Culture   Final    NO SALMONELLA OR SHIGELLA ISOLATED No Pathogenic E. coli detected NO CAMPYLOBACTER DETECTED    Report Status 09/07/2015 FINAL  Final  C difficile quick scan w PCR reflex     Status: None   Collection Time: 09/13/15 12:51 AM  Result Value Ref Range Status   C Diff antigen NEGATIVE NEGATIVE Final   C Diff toxin NEGATIVE NEGATIVE Final   C Diff interpretation Negative for C. difficile  Final  Culture, blood (routine x 2)     Status: None   Collection Time: 09/16/15 11:24 AM  Result Value Ref Range Status   Specimen Description BLOOD LEFT HAND  Final   Special Requests BOTTLES DRAWN AEROBIC AND ANAEROBIC  1CC  Final   Culture NO GROWTH 5 DAYS  Final   Report Status 09/21/2015 FINAL  Final  Culture, blood (routine x 2)     Status: None   Collection Time: 09/16/15 12:23 PM  Result Value Ref Range Status   Specimen Description BLOOD RIGHT HAND  Final   Special Requests BOTTLES DRAWN AEROBIC AND ANAEROBIC  1CC  Final   Culture  Setup Time   Final    GRAM POSITIVE COCCI AEROBIC BOTTLE ONLY CRITICAL RESULT CALLED TO, READ BACK BY AND VERIFIED WITH: TESS THOMAS,RN 09/17/2015 0631 BY JRS.    Culture   Final    ENTEROCOCCUS FAECALIS AEROBIC BOTTLE ONLY VRE HAVE INTRINSIC RESISTANCE TO MOST COMMONLY USED ANTIBIOTICS AND THE ABILITY TO ACQUIRE RESISTANCE TO MOST AVAILABLE ANTIBIOTICS. CRITICAL RESULT CALLED TO, READ BACK BY AND VERIFIED WITH: CHERYL SMITH AT 8119 09/19/15 DV    Report Status 09/21/2015 FINAL  Final   Organism ID, Bacteria ENTEROCOCCUS FAECALIS  Final      Susceptibility   Enterococcus faecalis - MIC*    AMPICILLIN <=2 SENSITIVE Sensitive     LINEZOLID 2 SENSITIVE Sensitive     CIPROFLOXACIN Value in next row Resistant      RESISTANT>=8    TETRACYCLINE Value in next row Resistant      RESISTANT>=16    VANCOMYCIN Value in next row Resistant      RESISTANT>=32    GENTAMICIN SYNERGY Value in next row  Resistant      RESISTANT>=32    * ENTEROCOCCUS FAECALIS  Culture, respiratory (NON-Expectorated)     Status: None   Collection Time: 09/16/15  3:50 PM  Result Value Ref Range Status   Specimen Description TRACHEAL ASPIRATE  Final   Special Requests Immunocompromised  Final   Gram Stain   Final    FEW WBC SEEN GOOD SPECIMEN - 80-90% WBCS RARE GRAM NEGATIVE RODS    Culture LIGHT GROWTH PSEUDOMONAS AERUGINOSA  Final   Report Status 09/19/2015 FINAL  Final   Organism ID, Bacteria PSEUDOMONAS AERUGINOSA  Final      Susceptibility   Pseudomonas aeruginosa - MIC*    CEFTAZIDIME 8 SENSITIVE Sensitive     CIPROFLOXACIN 2 INTERMEDIATE Intermediate     GENTAMICIN >=16 RESISTANT Resistant     IMIPENEM >=16 RESISTANT Resistant     PIP/TAZO Value in next row Sensitive      SENSITIVE32    CEFEPIME Value in next row Sensitive      SENSITIVE8    LEVOFLOXACIN Value in next row Resistant      RESISTANT>=8    * LIGHT GROWTH PSEUDOMONAS AERUGINOSA  Culture, expectorated sputum-assessment     Status: None   Collection Time: 09/22/15  2:09 PM  Result Value Ref Range Status   Specimen Description ENDOTRACHEAL  Final   Special Requests Normal  Final   Sputum evaluation THIS SPECIMEN IS ACCEPTABLE FOR SPUTUM CULTURE  Final   Report Status 09/24/2015 FINAL  Final  Culture, respiratory (NON-Expectorated)     Status: None   Collection Time: 09/22/15  2:09 PM  Result Value Ref Range Status   Specimen Description ENDOTRACHEAL  Final   Special Requests Normal Reflexed from [id number]  Final   Gram Stain   Final    FEW WBC SEEN FEW GRAM NEGATIVE RODS POOR SPECIMEN - LESS THAN 70% WBCS    Culture   Final    MODERATE GROWTH PSEUDOMONAS AERUGINOSA LIGHT GROWTH KLEBSIELLA PNEUMONIAE REFER TO SENSITIVITIES FROM PREVIOUS CULTURE FOR ORG 2    Report Status 10/01/2015 FINAL  Final   Organism ID, Bacteria PSEUDOMONAS AERUGINOSA  Final   Organism ID, Bacteria KLEBSIELLA PNEUMONIAE  Final      Susceptibility    Pseudomonas aeruginosa - MIC*    CEFTAZIDIME 4 SENSITIVE Sensitive     CIPROFLOXACIN 2 INTERMEDIATE Intermediate     GENTAMICIN >=16 RESISTANT Resistant     IMIPENEM >=16 RESISTANT Resistant     PIP/TAZO Value in next row Sensitive      SENSITIVE16    * MODERATE GROWTH PSEUDOMONAS AERUGINOSA  Tissue culture     Status: None   Collection Time: 09/24/15  6:44 AM  Result Value Ref Range Status   Specimen Description BONE  Final   Special Requests Normal  Final   Gram Stain MODERATE WBC SEEN FEW GRAM NEGATIVE RODS   Final   Culture   Final    MODERATE GROWTH ESCHERICHIA COLI MODERATE GROWTH KLEBSIELLA PNEUMONIAE ESBL-EXTENDED SPECTRUM BETA LACTAMASE-THE ORGANISM IS RESISTANT TO PENICILLINS, CEPHALOSPORINS AND AZTREONAM ACCORDING TO CLSI M100-S15 VOL.25 N01 JAN 2005. ORGANISM 1 This organism isolate is resistant to one or more antiotic agents in three or more antimicrobial categories.  Suggest Infectious Disease consult.   ORGANISM 2 CRITICAL RESULT CALLED TO, READ BACK BY AND VERIFIED WITH: RN Mardene Celeste LINDSAY 09/27/15 1005AM    Report Status 09/28/2015 FINAL  Final   Organism ID, Bacteria ESCHERICHIA COLI  Final   Organism ID, Bacteria KLEBSIELLA PNEUMONIAE  Final      Susceptibility   Escherichia coli - MIC*  AMPICILLIN >=32 RESISTANT Resistant     CEFTAZIDIME 4 RESISTANT Resistant     CEFAZOLIN >=64 RESISTANT Resistant     CEFTRIAXONE 8 RESISTANT Resistant     GENTAMICIN <=1 SENSITIVE Sensitive     IMIPENEM 8 RESISTANT Resistant     TRIMETH/SULFA <=20 SENSITIVE Sensitive     Extended ESBL POSITIVE Resistant     PIP/TAZO Value in next row Resistant      RESISTANT>=128    * MODERATE GROWTH ESCHERICHIA COLI   Klebsiella pneumoniae - MIC*    AMPICILLIN Value in next row Resistant      RESISTANT>=128    CEFTAZIDIME Value in next row Resistant      RESISTANT>=128    CEFAZOLIN Value in next row Resistant      RESISTANT>=128    CEFTRIAXONE Value in next row Resistant       RESISTANT>=128    CIPROFLOXACIN Value in next row Resistant      RESISTANT>=128    GENTAMICIN Value in next row Resistant      RESISTANT>=128    IMIPENEM Value in next row Resistant      RESISTANT>=128    TRIMETH/SULFA Value in next row Sensitive      RESISTANT>=128    PIP/TAZO Value in next row Resistant      RESISTANT>=128    * MODERATE GROWTH KLEBSIELLA PNEUMONIAE  Anaerobic culture     Status: None   Collection Time: 09/24/15  3:55 PM  Result Value Ref Range Status   Specimen Description BONE  Final   Special Requests Normal  Final   Culture NO ANAEROBES ISOLATED  Final   Report Status 09/29/2015 FINAL  Final  Culture, fungus without smear     Status: None   Collection Time: 09/24/15  3:55 PM  Result Value Ref Range Status   Specimen Description BONE  Final   Special Requests Normal  Final   Culture NO FUNGUS ISOLATED AFTER 24 DAYS  Final   Report Status 10/18/2015 FINAL  Final  C difficile quick scan w PCR reflex     Status: None   Collection Time: 10/07/15  2:02 PM  Result Value Ref Range Status   C Diff antigen NEGATIVE NEGATIVE Final   C Diff toxin NEGATIVE NEGATIVE Final   C Diff interpretation Negative for C. difficile  Final  Culture, blood (routine x 2)     Status: None   Collection Time: 10/16/15  9:52 AM  Result Value Ref Range Status   Specimen Description BLOOD LEFT HAND  Final   Special Requests   Final    BOTTLES DRAWN AEROBIC AND ANAEROBIC  AER 6CC ANA 4CC   Culture NO GROWTH 6 DAYS  Final   Report Status 10/22/2015 FINAL  Final  Culture, blood (routine x 2)     Status: None   Collection Time: 10/16/15 12:25 PM  Result Value Ref Range Status   Specimen Description BLOOD LEFT HAND  Final   Special Requests BOTTLES DRAWN AEROBIC AND ANAEROBIC  5CC  Final   Culture NO GROWTH 6 DAYS  Final   Report Status 10/22/2015 FINAL  Final  Culture, expectorated sputum-assessment     Status: None   Collection Time: 10/18/15  1:15 PM  Result Value Ref Range Status    Specimen Description SPUTUM  Final   Special Requests NONE  Final   Sputum evaluation THIS SPECIMEN IS ACCEPTABLE FOR SPUTUM CULTURE  Final   Report Status 10/18/2015 FINAL  Final  Culture, respiratory (NON-Expectorated)  Status: None   Collection Time: 10/18/15  1:15 PM  Result Value Ref Range Status   Specimen Description SPUTUM  Final   Special Requests NONE Reflexed from [id number]  Final   Gram Stain   Final    GOOD SPECIMEN - 80-90% WBCS MODERATE WBC SEEN MANY GRAM NEGATIVE RODS    Culture   Final    HEAVY GROWTH PSEUDOMONAS AERUGINOSA MODERATE GROWTH KLEBSIELLA PNEUMONIAE CRITICAL RESULT CALLED TO, READ BACK BY AND VERIFIED WITHRod Mae AT 1610 10/21/15 DV KLEBSIELLA PNEUMONIAE This organism isolate is resistant to one or more antiotic agents in three or more antimicrobial categories.  Suggest Infectious Disease  consult.      Report Status 10/22/2015 FINAL  Final   Organism ID, Bacteria PSEUDOMONAS AERUGINOSA  Final   Organism ID, Bacteria KLEBSIELLA PNEUMONIAE  Final      Susceptibility   Klebsiella pneumoniae - MIC*    AMPICILLIN >=32 RESISTANT Resistant     CEFAZOLIN >=64 RESISTANT Resistant     CEFTRIAXONE >=64 RESISTANT Resistant     CIPROFLOXACIN >=4 RESISTANT Resistant     GENTAMICIN >=16 RESISTANT Resistant     IMIPENEM >=16 RESISTANT Resistant     NITROFURANTOIN >=512 RESISTANT Resistant     TRIMETH/SULFA <=20 SENSITIVE Sensitive     * MODERATE GROWTH KLEBSIELLA PNEUMONIAE   Pseudomonas aeruginosa - MIC*    CEFTAZIDIME 4 SENSITIVE Sensitive     CIPROFLOXACIN >=4 RESISTANT Resistant     GENTAMICIN 8 INTERMEDIATE Intermediate     IMIPENEM >=16 RESISTANT Resistant     PIP/TAZO Value in next row Sensitive      SENSITIVE32    * HEAVY GROWTH PSEUDOMONAS AERUGINOSA  C difficile quick scan w PCR reflex     Status: None   Collection Time: 10/18/15  4:39 PM  Result Value Ref Range Status   C Diff antigen NEGATIVE NEGATIVE Final   C Diff toxin NEGATIVE  NEGATIVE Final   C Diff interpretation Negative for C. difficile  Final  Culture, blood (Routine X 2) w Reflex to ID Panel     Status: None   Collection Time: 11/06/15  8:27 AM  Result Value Ref Range Status   Specimen Description BLOOD RIGHT HAND  Final   Special Requests BOTTLES DRAWN AEROBIC AND ANAEROBIC 3CC  Final   Culture NO GROWTH 5 DAYS  Final   Report Status 11/11/2015 FINAL  Final  Culture, blood (Routine X 2) w Reflex to ID Panel     Status: None   Collection Time: 11/06/15  8:29 AM  Result Value Ref Range Status   Specimen Description BLOOD LEFT HAND  Final   Special Requests BOTTLES DRAWN AEROBIC AND ANAEROBIC  1CC  Final   Culture NO GROWTH 5 DAYS  Final   Report Status 11/11/2015 FINAL  Final  Culture, expectorated sputum-assessment     Status: None   Collection Time: 11/06/15  8:41 AM  Result Value Ref Range Status   Specimen Description EXPECTORATED SPUTUM  Final   Special Requests Immunocompromised  Final   Sputum evaluation THIS SPECIMEN IS ACCEPTABLE FOR SPUTUM CULTURE  Final   Report Status 11/06/2015 FINAL  Final  Culture, respiratory (NON-Expectorated)     Status: None   Collection Time: 11/06/15  8:41 AM  Result Value Ref Range Status   Specimen Description EXPECTORATED SPUTUM  Final   Special Requests Immunocompromised Reflexed from [id number]  Final   Gram Stain   Final    MANY WBC SEEN MANY GRAM  NEGATIVE RODS EXCELLENT SPECIMEN - 90-100% WBCS    Culture   Final    MODERATE GROWTH KLEBSIELLA PNEUMONIAE MODERATE GROWTH PSEUDOMONAS AERUGINOSA CRITICAL RESULT CALLED TO, READ BACK BY AND VERIFIED WITH: DR. FITZGERALD AT 1120 11/09/15 DV DR. FITZGERALD REQUESTED ORGANISM BE SENT OUT FOR TIGECYCLINE CEFT/AVIBACTAM AND COLISTIN Sent to [facility] for further susceptibility testing. 11/11/15 CTJ    Report Status 11/15/2015 FINAL  Final   Organism ID, Bacteria KLEBSIELLA PNEUMONIAE  Final   Organism ID, Bacteria PSEUDOMONAS AERUGINOSA  Final      Susceptibility    Klebsiella pneumoniae - MIC*    AMPICILLIN >=32 RESISTANT Resistant     CEFAZOLIN >=64 RESISTANT Resistant     CEFTRIAXONE 32 RESISTANT Resistant     CIPROFLOXACIN >=4 RESISTANT Resistant     GENTAMICIN >=16 RESISTANT Resistant     IMIPENEM >=16 RESISTANT Resistant     NITROFURANTOIN 256 RESISTANT Resistant     TRIMETH/SULFA >=320 RESISTANT Resistant     Extended ESBL POSITIVE Resistant     * MODERATE GROWTH KLEBSIELLA PNEUMONIAE   Pseudomonas aeruginosa - MIC*    CEFTAZIDIME >=64 RESISTANT Resistant     CIPROFLOXACIN 2 INTERMEDIATE Intermediate     GENTAMICIN >=16 RESISTANT Resistant     IMIPENEM >=16 RESISTANT Resistant     * MODERATE GROWTH PSEUDOMONAS AERUGINOSA  Culture, respiratory (NON-Expectorated)     Status: None (Preliminary result)   Collection Time: 11/14/15  2:07 PM  Result Value Ref Range Status   Specimen Description TRACHEAL ASPIRATE  Final   Special Requests NONE  Final   Gram Stain PENDING  Incomplete   Culture   Final    HEAVY GROWTH GRAM NEGATIVE RODS IDENTIFICATION AND SUSCEPTIBILITIES TO FOLLOW    Report Status PENDING  Incomplete    Coagulation Studies: No results for input(s): LABPROT, INR in the last 72 hours.  Urinalysis: No results for input(s): COLORURINE, LABSPEC, PHURINE, GLUCOSEU, HGBUR, BILIRUBINUR, KETONESUR, PROTEINUR, UROBILINOGEN, NITRITE, LEUKOCYTESUR in the last 72 hours.  Invalid input(s): APPERANCEUR    Imaging: Dg Chest Port 1 View  11/15/2015  CLINICAL DATA:  Respiratory failure. EXAM: PORTABLE CHEST 1 VIEW COMPARISON:  11/12/2015 . FINDINGS: Tracheostomy tube, left IJ line, right IJ dual-lumen catheter in stable position. Cardiomegaly with persistent unchanged bilateral pulmonary infiltrates/edema. Tiny pleural effusions cannot be excluded. No pneumothorax. IMPRESSION: 1. Lines and tubes in stable position. 2. Cardiomegaly with persistent bilateral pulmonary infiltrates/edema, no interim change. Tiny bilateral pleural effusions  cannot be excluded. Electronically Signed   By: Maisie Fus  Register   On: 11/15/2015 07:30     Medications:   . dextrose 5 % and 0.9% NaCl 10 mL/hr at 11/15/15 2000  . feeding supplement (JEVITY 1.5 CAL/FIBER) 1,000 mL (11/15/15 1154)  . norepinephrine Stopped (11/12/15 0751)   . [START ON 11/18/2015] amiodarone  200 mg Per Tube BID   Followed by  . [START ON 11/25/2015] amiodarone  200 mg Per Tube Daily  . amiodarone  400 mg Per Tube BID  . antiseptic oral rinse  7 mL Mouth Rinse QID  . aspirin  81 mg Oral Daily  . chlorhexidine gluconate  15 mL Mouth Rinse BID  . colistimethate (COLISTIN) IV  200 mg Intravenous Q48H  . epoetin (EPOGEN/PROCRIT) injection  10,000 Units Intravenous Q M,W,F-HD  . escitalopram  10 mg Oral Daily  . feeding supplement (PRO-STAT SUGAR FREE 64)  30 mL Oral 6 X Daily  . free water  20 mL Per Tube 6 times per day  .  heparin subcutaneous  5,000 Units Subcutaneous Q12H  . hydrocortisone  10 mg Per Tube BID  . lidocaine  1 patch Transdermal Q24H  . midodrine  10 mg Per Tube TID WC  . mirtazapine  7.5 mg Oral QHS  . multivitamin  5 mL Oral Daily  . pantoprazole sodium  40 mg Per Tube Daily  . saccharomyces boulardii  250 mg Oral BID  . sodium chloride  10-40 mL Intracatheter Q12H  . sodium hypochlorite   Irrigation BID  . tigecycline (TYGACIL) IVPB  50 mg Intravenous Q12H   acetaminophen (TYLENOL) oral liquid 160 mg/5 mL, HYDROmorphone (DILAUDID) injection, ipratropium-albuterol, loperamide, morphine injection, ondansetron (ZOFRAN) IV, oxyCODONE, polyvinyl alcohol, sodium chloride, zinc oxide  Assessment/ Plan:  49 y.o. 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 Admitted on 08/10/2015  Echo: 11/07/19: moderately to severely reduced. The estimated ejection fraction was in the range of 30% to 35%. Regional wall motion abnormalities: Severe hypokinesis of the anterior and anteroseptal myocardium.  1. End-stage renal disease on hemodialysis on HD MWF. The patient has been on dialysis since October of 2014. R IJ permcath. Dialysis treatment with IV albumin for oncotic support. Midodrine and hydrocortisone ordered for blood pressure support.  - Patient due for hemodialysis today. We will prepare orders. We have maintained blood flow rate in the 200s recently. Given relatively low blood pressure we will add back albumin today.  2. Anemia of CKD.  - Hemoglobin remains low despite use of Epogen and periodic blood transfusions. Most recent hemoglobin 7.2. Transfuse for hemoglobin of 7 or less.  3. Acute resp failure:  -Remains on the ventilator at this time with FiO2 40%.  4. Sepsis: VRE in blood. Bone cultures E. Coli and klebsiella  - Currently on colistimethate and  tigecycline.   5. Generalized edema/anasarca Continue midodrine. In addition we will add albumin back today for blood pressure support during dialysis for ultrafiltration.      LOS: 96 Courtney [name] 12/28/20167:45 AM

## 2015-11-17 DIAGNOSIS — F4321 Adjustment disorder with depressed mood: Secondary | ICD-10-CM

## 2015-11-17 DIAGNOSIS — J151 Pneumonia due to Pseudomonas: Secondary | ICD-10-CM

## 2015-11-17 LAB — CBC
HCT: 38.1 % (ref 35.0–47.0)
HEMOGLOBIN: 11 g/dL — AB (ref 12.0–16.0)
MCH: 29.1 pg (ref 26.0–34.0)
MCHC: 28.9 g/dL — AB (ref 32.0–36.0)
MCV: 100.4 fL — ABNORMAL HIGH (ref 80.0–100.0)
Platelets: 199 10*3/uL (ref 150–440)
RBC: 3.79 MIL/uL — ABNORMAL LOW (ref 3.80–5.20)
RDW: 19.9 % — AB (ref 11.5–14.5)
WBC: 34.3 10*3/uL — ABNORMAL HIGH (ref 3.6–11.0)

## 2015-11-17 LAB — GLUCOSE, CAPILLARY
GLUCOSE-CAPILLARY: 58 mg/dL — AB (ref 65–99)
GLUCOSE-CAPILLARY: 85 mg/dL (ref 65–99)
GLUCOSE-CAPILLARY: 93 mg/dL (ref 65–99)
Glucose-Capillary: 86 mg/dL (ref 65–99)
Glucose-Capillary: 62 mg/dL — ABNORMAL LOW (ref 65–99)
Glucose-Capillary: 70 mg/dL (ref 65–99)

## 2015-11-17 MED ORDER — DEXTROSE 10 % IV SOLN
INTRAVENOUS | Status: DC
Start: 2015-11-17 — End: 2015-12-09
  Administered 2015-11-17 – 2015-12-07 (×12): via INTRAVENOUS

## 2015-11-17 MED ORDER — DEXTROSE 50 % IV SOLN
25.0000 mL | Freq: Once | INTRAVENOUS | Status: AC
  Administered 2015-11-17: 25 mL via INTRAVENOUS

## 2015-11-17 MED ORDER — DEXTROSE 50 % IV SOLN
INTRAVENOUS | Status: AC
  Administered 2015-11-17: 25 mL via INTRAVENOUS
  Filled 2015-11-17: qty 50

## 2015-11-17 MED ORDER — DEXTROSE 50 % IV SOLN
INTRAVENOUS | Status: AC
Start: 2015-11-17 — End: 2015-11-17
  Administered 2015-11-17: 50 mL via INTRAVENOUS
  Filled 2015-11-17: qty 50

## 2015-11-17 MED ORDER — DEXTROSE 50 % IV SOLN
1.0000 | Freq: Once | INTRAVENOUS | Status: AC
  Administered 2015-11-17: 50 mL via INTRAVENOUS

## 2015-11-17 NOTE — Progress Notes (Signed)
Patient currently on Sizewize bed for decub ulcers and wounds. Unable to weigh patient on current bed. Called and ordered replacement bed. Return # [id number] Exchange # [id number]

## 2015-11-17 NOTE — Progress Notes (Signed)
ANTIBIOTIC CONSULT NOTE - INITIAL  Pharmacy Consult for Colistin and Tigecycline  Indication: MDR Respiratory Infection   Allergies  Allergen Reactions  . Contrast Media [Iodinated Diagnostic Agents] Anaphylaxis  . Ampicillin Rash   Patient Measurements: Height:  (SCALE BROKE) Weight:  (unable to weigh bed is broken) IBW/kg (Calculated) : 66.2  Vital Signs: Temp: 98.1 F (36.7 C) (12/29 0800) Temp Source: Axillary (12/29 0800) BP: 108/48 mmHg (12/29 0800) Pulse Rate: 70 (12/29 0800) Intake/Output from previous day: 12/28 0701 - 12/29 0700 In: 1145.2 [I.V.:575.2; NG/GT:410; IV Piggyback:100] Out: 1615 [Emesis/NG output:115] Intake/Output from this shift: Total I/O In: 299.2 [I.V.:279.2; NG/GT:20] Out: -   Recent Labs  11/16/15 1013  WBC 34.3*  HGB 11.0*  PLT 199   Estimated Creatinine Clearance: 44.7 mL/min (by C-G formula based on Cr of 1.71). No results for input(s): VANCOTROUGH, VANCOPEAK, VANCORANDOM, GENTTROUGH, GENTPEAK, GENTRANDOM, TOBRATROUGH, TOBRAPEAK, TOBRARND, AMIKACINPEAK, AMIKACINTROU, AMIKACIN in the last 72 hours.   Microbiology: Recent Results (from the past 720 hour(s))  Culture, expectorated sputum-assessment     Status: None   Collection Time: 10/18/15  1:15 PM  Result Value Ref Range Status   Specimen Description SPUTUM  Final   Special Requests NONE  Final   Sputum evaluation THIS SPECIMEN IS ACCEPTABLE FOR SPUTUM CULTURE  Final   Report Status 10/18/2015 FINAL  Final  Culture, respiratory (NON-Expectorated)     Status: None   Collection Time: 10/18/15  1:15 PM  Result Value Ref Range Status   Specimen Description SPUTUM  Final   Special Requests NONE Reflexed from [id number]  Final   Gram Stain   Final    GOOD SPECIMEN - 80-90% WBCS MODERATE WBC SEEN MANY GRAM NEGATIVE RODS    Culture   Final    HEAVY GROWTH PSEUDOMONAS AERUGINOSA MODERATE GROWTH KLEBSIELLA PNEUMONIAE CRITICAL RESULT CALLED TO, READ BACK BY AND VERIFIED WITHRod Mae: JOANNA  LINDSAY AT [id number] 10/21/15 DV KLEBSIELLA PNEUMONIAE This organism isolate is resistant to one or more antiotic agents in three or more antimicrobial categories.  Suggest Infectious Disease  consult.      Report Status 10/22/2015 FINAL  Final   Organism ID, Bacteria PSEUDOMONAS AERUGINOSA  Final   Organism ID, Bacteria KLEBSIELLA PNEUMONIAE  Final      Susceptibility   Klebsiella pneumoniae - MIC*    AMPICILLIN >=32 RESISTANT Resistant     CEFAZOLIN >=64 RESISTANT Resistant     CEFTRIAXONE >=64 RESISTANT Resistant     CIPROFLOXACIN >=4 RESISTANT Resistant     GENTAMICIN >=16 RESISTANT Resistant     IMIPENEM >=16 RESISTANT Resistant     NITROFURANTOIN >=512 RESISTANT Resistant     TRIMETH/SULFA <=20 SENSITIVE Sensitive     * MODERATE GROWTH KLEBSIELLA PNEUMONIAE   Pseudomonas aeruginosa - MIC*    CEFTAZIDIME 4 SENSITIVE Sensitive     CIPROFLOXACIN >=4 RESISTANT Resistant     GENTAMICIN 8 INTERMEDIATE Intermediate     IMIPENEM >=16 RESISTANT Resistant     PIP/TAZO Value in next row Sensitive      SENSITIVE32    * HEAVY GROWTH PSEUDOMONAS AERUGINOSA  C difficile quick scan w PCR reflex     Status: None   Collection Time: 10/18/15  4:39 PM  Result Value Ref Range Status   C Diff antigen NEGATIVE NEGATIVE Final   C Diff toxin NEGATIVE NEGATIVE Final   C Diff interpretation Negative for C. difficile  Final  Culture, blood (Routine X 2) w Reflex to ID Panel     Status:  None   Collection Time: 11/06/15  8:27 AM  Result Value Ref Range Status   Specimen Description BLOOD RIGHT HAND  Final   Special Requests BOTTLES DRAWN AEROBIC AND ANAEROBIC 3CC  Final   Culture NO GROWTH 5 DAYS  Final   Report Status 11/11/2015 FINAL  Final  Culture, blood (Routine X 2) w Reflex to ID Panel     Status: None   Collection Time: 11/06/15  8:29 AM  Result Value Ref Range Status   Specimen Description BLOOD LEFT HAND  Final   Special Requests BOTTLES DRAWN AEROBIC AND ANAEROBIC  1CC  Final   Culture NO  GROWTH 5 DAYS  Final   Report Status 11/11/2015 FINAL  Final  Culture, expectorated sputum-assessment     Status: None   Collection Time: 11/06/15  8:41 AM  Result Value Ref Range Status   Specimen Description EXPECTORATED SPUTUM  Final   Special Requests Immunocompromised  Final   Sputum evaluation THIS SPECIMEN IS ACCEPTABLE FOR SPUTUM CULTURE  Final   Report Status 11/06/2015 FINAL  Final  Culture, respiratory (NON-Expectorated)     Status: None   Collection Time: 11/06/15  8:41 AM  Result Value Ref Range Status   Specimen Description EXPECTORATED SPUTUM  Final   Special Requests Immunocompromised Reflexed from [id number]  Final   Gram Stain   Final    MANY WBC SEEN MANY GRAM NEGATIVE RODS EXCELLENT SPECIMEN - 90-100% WBCS    Culture   Final    MODERATE GROWTH KLEBSIELLA PNEUMONIAE MODERATE GROWTH PSEUDOMONAS AERUGINOSA CRITICAL RESULT CALLED TO, READ BACK BY AND VERIFIED WITH: DR. FITZGERALD AT 1120 11/09/15 DV DR. FITZGERALD REQUESTED ORGANISM BE SENT OUT FOR TIGECYCLINE CEFT/AVIBACTAM AND COLISTIN Sent to [facility] for further susceptibility testing. 11/11/15 CTJ    Report Status 11/15/2015 FINAL  Final   Organism ID, Bacteria KLEBSIELLA PNEUMONIAE  Final   Organism ID, Bacteria PSEUDOMONAS AERUGINOSA  Final      Susceptibility   Klebsiella pneumoniae - MIC*    AMPICILLIN >=32 RESISTANT Resistant     CEFAZOLIN >=64 RESISTANT Resistant     CEFTRIAXONE 32 RESISTANT Resistant     CIPROFLOXACIN >=4 RESISTANT Resistant     GENTAMICIN >=16 RESISTANT Resistant     IMIPENEM >=16 RESISTANT Resistant     NITROFURANTOIN 256 RESISTANT Resistant     TRIMETH/SULFA >=320 RESISTANT Resistant     Extended ESBL POSITIVE Resistant     * MODERATE GROWTH KLEBSIELLA PNEUMONIAE   Pseudomonas aeruginosa - MIC*    CEFTAZIDIME >=64 RESISTANT Resistant     CIPROFLOXACIN 2 INTERMEDIATE Intermediate     GENTAMICIN >=16 RESISTANT Resistant     IMIPENEM >=16 RESISTANT Resistant     * MODERATE GROWTH  PSEUDOMONAS AERUGINOSA  Culture, respiratory (NON-Expectorated)     Status: None   Collection Time: 11/14/15  2:07 PM  Result Value Ref Range Status   Specimen Description TRACHEAL ASPIRATE  Final   Special Requests NONE  Final   Gram Stain   Final    EXCELLENT SPECIMEN - 90-100% WBCS MANY WBC SEEN MODERATE GRAM NEGATIVE RODS    Culture   Final    HEAVY GROWTH PSEUDOMONAS AERUGINOSA CRITICAL VALUE NOTED.  VALUE IS CONSISTENT WITH PREVIOUSLY REPORTED AND CALLED VALUE.    Report Status 11/16/2015 FINAL  Final   Organism ID, Bacteria PSEUDOMONAS AERUGINOSA  Final      Susceptibility   Pseudomonas aeruginosa - MIC*    CEFTAZIDIME >=64 RESISTANT Resistant     CIPROFLOXACIN 2  INTERMEDIATE Intermediate     GENTAMICIN >=16 RESISTANT Resistant     IMIPENEM >=16 RESISTANT Resistant     PIP/TAZO Value in next row Resistant      RESISTANT>=128    TOBRAMYCIN Value in next row Sensitive      SENSITIVE2    * HEAVY GROWTH PSEUDOMONAS AERUGINOSA    Medical History: Past Medical History  Diagnosis Date  . Obesity   . Dyslipidemia   . Hypertension   . Coronary artery disease     s/p BMS 2010 LAD  . Dysrhythmia     ventricular tachycardia resolved after LAD stent and beta blocker  . Diabetes mellitus     with retinopathy, neuropathy and microalbuminemia  . ESRD (end stage renal disease) on dialysis    Assessment: 49 yo female with chronic trach, ESRD, and  profound debilitation, needing treatment for MDR Respiratory infection. Patient is on day 71 of hospital stay, and has been treated for multiple infections during this time.   11/06/15: Respiratory cultures showing Pan-resistant  Klebsiella pneumoniae and Pseudomonas aeruginosa  Sputum has been sent to [facility] for susceptibility testing per Dr. Jarrett Ables request.   Patient has increased respiratory secretions, leukocytosis and low grade fever   Plan:  Sensitivity results for Avycaz, tigecycline, and colistin are pending.    Continue tigecycline  IV q12h.   Continue colistimethate  (2.5 mg/kg) IV q48 hours.   Patient should be monitored for nephrotoxicity and neurotoxicity.  Pharmacy to continue to follow per consult.      Luisa Hart, PharmD   11/17/2015,12:27 PM

## 2015-11-17 NOTE — Progress Notes (Signed)
PULMONARY / CRITICAL CARE MEDICINE   Name: Courtney GrinderHedda McMillian [name] MRN: [id number] DOB: September 07, 1966    ADMISSION DATE:  08/10/2015  BRIEF HISTORY: 49 AAF who has been in medical facilities (hosp, LTAC, rehab) for 2 yrs following gastric bypass surgery with multiple complications. Now with chronic trach, ESRD, profound debilitation, severe sacral pressure ulcer. Was seemingly making progress and transferred to rehab facility approx one week prior to this admission. [facility] with AMS and hypotension. Working dx of severe sepsis/septic shock due to infected sacral pressure ulcer. Since admission. Her course is been very complicated with numerous complications including septic shock and GI bleeding. Now with failure to wean from vent and possible MI event, repeat ECHO EF 30-35, possible old MI, severe hypokinesis of the nnterior and anteroseptal myocardium   INTERVAL HISTORY: Patient with Low FSBS, on D5 infusion, stopped TF's due to gagging and vomiting.   Summary of MAJOR EVENTS/TEST RESULTS: Admission 02/07/14-05/07/14 Admission 07/21/14-09/06/14 Discharged to [facility]. Pt had palliative consult at that time, were asked to sign off by husband.  09/23 CT head: NAD 09/23 EEG: no epileptiform activity 09/23 PRBCs for Hgb 6.4 09/24 bedside debridement of sacral wound. Abscess drained 09/25 Off vasopressors. More alert. No distress. Worsening thrombocytopenia. Vanc DC'd 09/29 Dr. [name] (I.D) excused from the case by patient's husband. 10/02 MRI -multiple infarcts 10/03 tracheal bleeding- transfused platelets 10/03 hospitalist service excused from the case by patient's husband 10/03 Echocardiogram ejection fraction was 55-60%, pulmonary systolic pressure was 39 mmHg 40/9810/08 restart TF's at lower rate, attempt reg HD  10/12 Transferred to med-surg floor. Remains on PCCM service 10/14 SLP eval: pt unable to tolerate PMV adequately 10/17-will re-attempt PM valve-discussed with Speech  therapist 10/18 passed swallow eval-start pureed thick foods no thin liquids-continue NG feeds 10/19 transferred to step down for sepsis/aspiration pneumonia 10/19 cxr shows RLL opacity 10/21 sacral decub debride at bedside by surgery 10/22 started back on vasopressors while on HD 10/27 CT with osteo, R hip fx, unable to identify tip of dubhoff tube - sent for fluoro study 10/28 Ortho consultation: I do not feel that she is a surgical candidate. Therefore, I feel that it would best to manage this fracture nonsurgically and allow it to heal by itself over time, which it should.  10/28 Gen Surg consultation: Due to the lack of free air or free flow of contrast and the peritoneum there is no indication for any surgical intervention on this. Would recommend pulling back the feeding tube 1-2 cm. Would recheck an abdominal film to confirm no pneumoperitoneum in the morning. Absence of any changes okay to continue using Dobbhoff for feeding and medications. 10/28 gastrograffin study: The study confirms that the feeding tube pes perforated through the duodenum and the tip is within a cavity that fills with injected contrast. The cavity does appear walled off 11/4 refuses oral feeds, continue TF's CT reviewed: T8-T9 discitis/osteomyelitis, RT HIP FRACTURE 11/5 placed back on vasopressors, placed back on Vent due to resp acidosis, levophed turned off 11/6 afternoon 11/5 PM Trach changed out due to cuff leak, #6 Shiley cuffed, 11/6 dubhoff occluded, removed and replaced, new dubhoff shows tube in the antral stomach 11/15 refusing to take oral feeds 11/18 placed back on Vent for increased WOB,SOB. 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 10/18/2015; the patient developed a new left basal pneumonia; with recurrent sepsis.  10/19/2015; fevers resolved with  IV acetaminophen. Tube feeds changed from vital to Jevity.  12/7 CXR with  stable b/l opacities.  12/12 elevated K s/p HD-remains on AC mode 12/16-vomiting-hold feeds and re-assess in next 24 hrs 12/17- TF restarted at 1/2 goal (goal = 50cc/per) 12/18- elevated wbc, fever (101.4), recultured, restarted on ceftaz 12/19 Pericardial friction rub. Echocardiogram: LVEF 30-35%, anteroseptal hypokinesis or akinesis c/w anterior wall MI 12/20 Dr Sung Amabile discussed new echocardiogram findings with pt's husband and explained that this likely represents an anterior wall MI sometime in the previous couple of weeks. It was explained that she is not a candidate for any cardiology intervention. Code status was again discussed. Husband expressed understanding that she was not going to survive this illness in any favorable way but continues to request that she be full code status based on what he believes to be her wishes 12/21 vomitied 50cc of TF - AM TF held, restart TF at 3pm  12/26 Persistent intermittent trach cuff leak. Distal XLT trach requested.  12/27 15 beat NSVT 12/28 patient refused HD and refused Bath time 12/29 D10 infusion for hypoglycemia, TF's on hold due to intolerance  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 Pneumoniae, P. Mirabilis, VRE Wound 9/26 >> No growth Blood 9/23 >> NEG CDiff 9/27>>neg Stool Cx 10/15>> negative Cdiff 10/25>>neg Trach Aspirate 10/28>> light growth pseudomonas Blood 10/28 >> 1/2 GPC >>  Sputum cultures obtained 09/22/15 due to mucus plugging>>Pseudomonas BONE TISSUE Cx 11/3>>E.coli (ESBL), k. Pneumonia (daptomycin and septra) Bld Cx 11/27>>negative thus far.  Trach Asp  11/27>> grew out Pseudomonas, and Klebsiella pneumonia. Trach Asp 12/18>>PAN RESISTANT Klebsiella, Pseudomonas Bld Cx 12/18>> NEG 12/26 resp culture >> pseudomonas  ANTIMICROBIALS:  Aztreonam 9/23 >> 9/24 Vanc 9/23 >> 9/25 Vanc 9/26>>9/27 Daptomycin 9/27>> 10/12, 10/28>> off Meropenem 9/23 >> 10/14, 10/28 >> 10/31 Zosyn 11/27,>> 11/30. Levaquin 11/29>> 11/29. Ceftaz 12/1>>12/11, 12/18>> 12/24 Colisitin 12/26 >>  Tigecycline 12/26 >>  Septra 11/8>> chronic   VITAL SIGNS: Temp:  [98.1 F (36.7 C)-100.7 F (38.2 C)] 98.1 F (36.7 C) (12/29 0800) Pulse Rate:  [66-91] 70 (12/29 0800) Resp:  [0-32] 20 (12/29 0800) BP: (85-117)/(23-83) 108/48 mmHg (12/29 0800) SpO2:  [92 %-100 %] 100 % (12/29 0800) FiO2 (%):  [40 %] 40 % (12/29 0810) HEMODYNAMICS:   VENTILATOR SETTINGS: Vent Mode:  [-] PRVC FiO2 (%):  [40 %] 40 % Set Rate:  [14 bmp] 14 bmp Vt Set:  [530 mL] 530 mL PEEP:  [5 cmH20] 5 cmH20 INTAKE / OUTPUT:  Intake/Output Summary (Last 24 hours) at 11/17/15 1121 Last data filed at 11/17/15 1111  Gross per 24 hour  Intake 1204.34 ml  Output   1615 ml  Net -410.66 ml    Review of Systems  Unable to perform ROS  Unable to fully obtain, due to trach and critical illness Physical Exam  Vent dependent Somonlent HEENT WNL, NGT present Trach site clean Diffuse rhonchi Reg with occ extrasystoles, no M Abdomen soft, + BS Ext cool, trace symmetric edema, marked muscle wasting Severe sacral decubitus ulcer-   LABS:  CBC  Recent Labs Lab 11/14/15 0512 11/14/15 0855 11/16/15 1013  WBC 19.9* 19.1* 34.3*  HGB 7.3* 7.2*  11.0*  HCT 24.3* 24.1* 38.1  PLT 238 242 199   Coag's No results for input(s): APTT, INR in the last 168 hours. BMET  Recent Labs Lab 11/11/15 0558 11/14/15 0855  NA 143 141  K 3.9 4.8  CL 104 100*  CO2 33* 31  BUN 61* 87*  CREATININE 1.49* 1.71*  GLUCOSE 153* 87   Electrolytes  Recent Labs Lab 11/11/15 0558 11/14/15 0855   CALCIUM 8.5* 8.5*  PHOS  --  4.7*   Sepsis Markers No results for input(s): LATICACIDVEN, PROCALCITON, O2SATVEN in the last 168 hours. ABG No results for input(s): PHART, PCO2ART, PO2ART in the last 168 hours. Liver Enzymes  Recent Labs Lab 11/14/15 0855  ALBUMIN 1.6*   Cardiac Enzymes No results for input(s): TROPONINI, PROBNP in the last 168 hours. Glucose  Recent Labs Lab 11/16/15 1703 11/16/15 1939 11/17/15 0044 11/17/15 0156 11/17/15 0631 11/17/15 0712  GLUCAP 75 62* 58* 86 62* 70    Imaging No results found.  ASSESSMENT / PLAN: IMPRESSION: Very prolonged and complicated hospitalization after gastric bypass surgery 2014 Never returned home ESRD Recurrent severe sepsis - PAN resistant Klebisella in sputum Recurrent PNA-now with pseudomonas H/O C diff H/O VTE DM2 - controlled Episodic hypoglycemia Chronic steroid therapy, for a history of of adrenal insufficiency Incidental finding of R hip fx - conservative mgmt. 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 Husband declined TEE Profound deconditioning Chronic pain New finding of pericardial friction rub 12/19 New finding of cardiomyopathy, likely post MI 12/19 Atrial flutter HCAP with MDR Klebs and pseudomonas HYPOGLYCEMIA  Cont vent support - settings reviewed and/or adjusted Cont vent bundle Daily SBT if/when meets criteria Cont HD per renal Monitor BMET intermittently Monitor I/Os Correct electrolytes as indicated DVT px: SQ heparin Monitor CBC intermittently Transfuse per usual ICU guidelines HOLD TF's for now Monitor temp, WBC count Micro and abx as above ID following - appreciate recs On oral amiodarone gtt for A. Flutter Recheck CXR 12/26 Recheck resp culture  12/26 ID consult 12/26 Colistin and tigecycline 12/26 per last recs of ID 12/21 Pain meds as needed D10 Infusion for hypoglycemia  Aggressive vent wean today, patient being kept alive with Vent and HD  The Patient requires high complexity decision making for assessment and support, frequent evaluation and titration of therapies, application of advanced monitoring technologies and extensive interpretation of multiple databases. Critical Care Time devoted to patient care services described in this note is 45 minutes.   Overall, patient is critically ill, prognosis is guarded.  Patient with Multiorgan failure and at high risk for cardiac arrest and death.    Lucie Leather, M.D.  [facility]  Medical Director [facility] Medical Director [facility] Cardio-Pulmonary Department

## 2015-11-17 NOTE — Progress Notes (Signed)
Dr Belia HemanKasa made aware about fixed bed scale and new weight recorded. According to the last weight recorded and the new weight today the patient has gained 51 lbs.

## 2015-11-17 NOTE — Progress Notes (Signed)
Subjective:  Patient completed HD yesterday. UF achieved was 1.1kg.  Resting comfortably this AM.  Vent settings curretnly 40% fio2 5 of PEEP.  Objective:  Vital signs in last 24 hours:  Temp:  [98.1 F (36.7 C)-100.7 F (38.2 C)] 98.1 F (36.7 C) (12/29 0500) Pulse Rate:  [66-91] 68 (12/29 0500) Resp:  [0-34] 14 (12/29 0500) BP: (85-123)/(23-83) 107/51 mmHg (12/29 0500) SpO2:  [92 %-100 %] 100 % (12/29 0500) FiO2 (%):  [40 %] 40 % (12/29 0810)  Weight change:  Filed Weights    Intake/Output: I/O last 3 completed shifts: In: 1180 [I.V.:210; Other:60; NG/GT:910] Out: 1615 [Emesis/NG output:115; Other:1500]   Intake/Output this shift:     Physical Exam: General: critically ill appearing  Head/ENT: OM moist, NG tube  Eyes: Anicteric, eyes closed  Neck: Tracheostomy in place  Lungs:  bilatearl rhonchi, vent assisted fio2 40% PEEP 5  Heart: S1S2 no rubs  Abdomen:  Soft, nontender, BS present  Extremities: 2+ dependent edema, anasarca  Neurologic: Resting comfortably at the moment  Access:  R IJ permcath.       Basic Metabolic Panel:  Recent Labs Lab 11/10/15 0933 11/11/15 0558 11/14/15 0855  NA 138 143 141  K 3.6 3.9 4.8  CL 96* 104 100*  CO2 31 33* 31  GLUCOSE 279* 153* 87  BUN 47* 61* 87*  CREATININE 1.23* 1.49* 1.71*  CALCIUM 8.1* 8.5* 8.5*  PHOS  --   --  4.7*    Liver Function Tests:  Recent Labs Lab 11/14/15 0855  ALBUMIN 1.6*   No results for input(s): LIPASE, AMYLASE in the last 168 hours. No results for input(s): AMMONIA in the last 168 hours.  CBC:  Recent Labs Lab 11/10/15 0930 11/11/15 0558 11/14/15 0512 11/14/15 0855 11/16/15 1013  WBC 23.1* 19.3* 19.9* 19.1* 34.3*  NEUTROABS  --  16.7*  --   --   --   HGB 7.9* 7.6* 7.3* 7.2* 11.0*  HCT 26.5* 25.1* 24.3* 24.1* 38.1  MCV 95.4 96.6 94.3 94.9 100.4*  PLT 230 217 238 242 199    Cardiac Enzymes:  Recent Labs Lab 11/10/15 0930  TROPONINI 0.28*    BNP: Invalid  input(s): POCBNP  CBG:  Recent Labs Lab 11/16/15 1939 11/17/15 0044 11/17/15 0156 11/17/15 0631 11/17/15 0712  GLUCAP 62* 58* 86 62* 70    Microbiology: Results for orders placed or performed during the hospital encounter of 07/27/2015  Blood Culture (routine x 2)     Status: None   Collection Time: 07/29/2015  8:51 AM  Result Value Ref Range Status   Specimen Description BLOOD [name]  Final   Special Requests BOTTLES DRAWN AEROBIC AND ANAEROBIC  3CC  Final   Culture NO GROWTH 5 DAYS  Final   Report Status 08/17/2015 FINAL  Final  Blood Culture (routine x 2)     Status: None   Collection Time: 08/17/2015  9:20 AM  Result Value Ref Range Status   Specimen Description BLOOD LEFT ARM  Final   Special Requests BOTTLES DRAWN AEROBIC AND ANAEROBIC  1CC  Final   Culture NO GROWTH 5 DAYS  Final   Report Status 08/17/2015 FINAL  Final  Wound culture     Status: None   Collection Time: 08/08/2015  9:20 AM  Result Value Ref Range Status   Specimen Description DECUBITIS  Final   Special Requests Normal  Final   Gram Stain   Final    FEW WBC SEEN MANY GRAM NEGATIVE RODS  RARE GRAM POSITIVE COCCI    Culture   Final    HEAVY GROWTH ESCHERICHIA COLI MODERATE GROWTH ENTEROBACTER AEROGENES PROTEUS MIRABILIS HEAVY GROWTH ENTEROCOCCUS SPECIES VRE HAVE INTRINSIC RESISTANCE TO MOST COMMONLY USED ANTIBIOTICS AND THE ABILITY TO ACQUIRE RESISTANCE TO MOST AVAILABLE ANTIBIOTICS.    Report Status 08/16/2015 FINAL  Final   Organism ID, Bacteria ESCHERICHIA COLI  Final   Organism ID, Bacteria ENTEROBACTER AEROGENES  Final   Organism ID, Bacteria PROTEUS MIRABILIS  Final   Organism ID, Bacteria ENTEROCOCCUS SPECIES  Final      Susceptibility   Enterobacter aerogenes - MIC*    CEFTAZIDIME <=1 SENSITIVE Sensitive     CEFAZOLIN >=64 RESISTANT Resistant     CEFTRIAXONE <=1 SENSITIVE Sensitive     CIPROFLOXACIN <=0.25 SENSITIVE Sensitive     GENTAMICIN <=1 SENSITIVE Sensitive     IMIPENEM 1 SENSITIVE  Sensitive     TRIMETH/SULFA <=20 SENSITIVE Sensitive     * MODERATE GROWTH ENTEROBACTER AEROGENES   Escherichia coli - MIC*    AMPICILLIN <=2 SENSITIVE Sensitive     CEFTAZIDIME <=1 SENSITIVE Sensitive     CEFAZOLIN <=4 SENSITIVE Sensitive     CEFTRIAXONE <=1 SENSITIVE Sensitive     CIPROFLOXACIN <=0.25 SENSITIVE Sensitive     GENTAMICIN <=1 SENSITIVE Sensitive     IMIPENEM <=0.25 SENSITIVE Sensitive     TRIMETH/SULFA <=20 SENSITIVE Sensitive     * HEAVY GROWTH ESCHERICHIA COLI   Proteus mirabilis - MIC*    AMPICILLIN >=32 RESISTANT Resistant     CEFTAZIDIME <=1 SENSITIVE Sensitive     CEFAZOLIN 8 SENSITIVE Sensitive     CEFTRIAXONE <=1 SENSITIVE Sensitive     CIPROFLOXACIN <=0.25 SENSITIVE Sensitive     GENTAMICIN <=1 SENSITIVE Sensitive     IMIPENEM 1 SENSITIVE Sensitive     TRIMETH/SULFA <=20 SENSITIVE Sensitive     * PROTEUS MIRABILIS   Enterococcus species - MIC*    AMPICILLIN >=32 RESISTANT Resistant     VANCOMYCIN >=32 RESISTANT Resistant     GENTAMICIN SYNERGY SENSITIVE Sensitive     TETRACYCLINE Value in next row Resistant      RESISTANT>=16    * HEAVY GROWTH ENTEROCOCCUS SPECIES  MRSA PCR Screening     Status: None   Collection Time: 07/22/2015  2:38 PM  Result Value Ref Range Status   MRSA by PCR NEGATIVE NEGATIVE Final    Comment:        The GeneXpert MRSA Assay (FDA approved for NASAL specimens only), is one component of a comprehensive MRSA colonization surveillance program. It is not intended to diagnose MRSA infection nor to guide or monitor treatment for MRSA infections.   Blood culture (single)     Status: None   Collection Time: 07/22/2015  3:36 PM  Result Value Ref Range Status   Specimen Description BLOOD RIGHT ASSIST CONTROL  Final   Special Requests BOTTLES DRAWN AEROBIC AND ANAEROBIC  Final   Culture NO GROWTH 5 DAYS  Final   Report Status 08/17/2015 FINAL  Final  Wound culture     Status: None   Collection Time: 08/13/15 12:37 PM  Result  Value Ref Range Status   Specimen Description WOUND  Final   Special Requests Normal  Final   Gram Stain   Final    FEW WBC SEEN TOO NUMEROUS TO COUNT GRAM NEGATIVE RODS FEW GRAM POSITIVE COCCI    Culture   Final    HEAVY GROWTH ESCHERICHIA COLI MODERATE GROWTH PROTEUS MIRABILIS LIGHT  GROWTH KLEBSIELLA PNEUMONIAE MODERATE GROWTH ENTEROCOCCUS GALLINARUM CRITICAL RESULT CALLED TO, READ BACK BY AND VERIFIED WITH: [facility] BORBA AT 1042 08/16/15 DV    Report Status 08/17/2015 FINAL  Final   Organism ID, Bacteria ESCHERICHIA COLI  Final   Organism ID, Bacteria PROTEUS MIRABILIS  Final   Organism ID, Bacteria KLEBSIELLA PNEUMONIAE  Final   Organism ID, Bacteria ENTEROCOCCUS GALLINARUM  Final      Susceptibility   Escherichia coli - MIC*    AMPICILLIN >=32 RESISTANT Resistant     CEFTAZIDIME 4 RESISTANT Resistant     CEFAZOLIN >=64 RESISTANT Resistant     CEFTRIAXONE 16 RESISTANT Resistant     GENTAMICIN 2 SENSITIVE Sensitive     IMIPENEM >=16 RESISTANT Resistant     TRIMETH/SULFA <=20 SENSITIVE Sensitive     Extended ESBL POSITIVE Resistant     PIP/TAZO Value in next row Resistant      RESISTANT>=128    CIPROFLOXACIN Value in next row Sensitive      SENSITIVE<=0.25    * HEAVY GROWTH ESCHERICHIA COLI   Klebsiella pneumoniae - MIC*    AMPICILLIN Value in next row Resistant      SENSITIVE<=0.25    CEFTAZIDIME Value in next row Resistant      SENSITIVE<=0.25    CEFAZOLIN Value in next row Resistant      SENSITIVE<=0.25    CEFTRIAXONE Value in next row Resistant      SENSITIVE<=0.25    CIPROFLOXACIN Value in next row Resistant      SENSITIVE<=0.25    GENTAMICIN Value in next row Sensitive      SENSITIVE<=0.25    IMIPENEM Value in next row Resistant      SENSITIVE<=0.25    TRIMETH/SULFA Value in next row Resistant      SENSITIVE<=0.25    PIP/TAZO Value in next row Resistant      RESISTANT>=128    * LIGHT GROWTH KLEBSIELLA PNEUMONIAE   Proteus mirabilis - MIC*    AMPICILLIN  Value in next row Resistant      RESISTANT>=128    CEFTAZIDIME Value in next row Sensitive      RESISTANT>=128    CEFAZOLIN Value in next row Sensitive      RESISTANT>=128    CEFTRIAXONE Value in next row Sensitive      RESISTANT>=128    CIPROFLOXACIN Value in next row Sensitive      RESISTANT>=128    GENTAMICIN Value in next row Sensitive      RESISTANT>=128    IMIPENEM Value in next row Sensitive      RESISTANT>=128    TRIMETH/SULFA Value in next row Sensitive      RESISTANT>=128    PIP/TAZO Value in next row Sensitive      SENSITIVE<=4    * MODERATE GROWTH PROTEUS MIRABILIS   Enterococcus gallinarum - MIC*    AMPICILLIN Value in next row Resistant      SENSITIVE<=4    GENTAMICIN SYNERGY Value in next row Sensitive      SENSITIVE<=4    CIPROFLOXACIN Value in next row Resistant      RESISTANT>=8    TETRACYCLINE Value in next row Resistant      RESISTANT>=16    * MODERATE GROWTH ENTEROCOCCUS GALLINARUM  Wound culture     Status: None   Collection Time: 08/15/15  2:53 PM  Result Value Ref Range Status   Specimen Description WOUND  Final   Special Requests NONE  Final   Gram Stain FEW WBC SEEN  NO ORGANISMS SEEN   Final   Culture NO GROWTH 3 DAYS  Final   Report Status 08/18/2015 FINAL  Final  C difficile quick scan w PCR reflex     Status: None   Collection Time: 08/17/15 11:34 AM  Result Value Ref Range Status   C Diff antigen NEGATIVE NEGATIVE Final   C Diff toxin NEGATIVE NEGATIVE Final   C Diff interpretation Negative for C. difficile  Final  Stool culture     Status: None   Collection Time: 09/03/15  3:51 PM  Result Value Ref Range Status   Specimen Description STOOL  Final   Special Requests Immunocompromised  Final   Culture   Final    NO SALMONELLA OR SHIGELLA ISOLATED No Pathogenic E. coli detected NO CAMPYLOBACTER DETECTED    Report Status 09/07/2015 FINAL  Final  C difficile quick scan w PCR reflex     Status: None   Collection Time: 09/13/15 12:51 AM   Result Value Ref Range Status   C Diff antigen NEGATIVE NEGATIVE Final   C Diff toxin NEGATIVE NEGATIVE Final   C Diff interpretation Negative for C. difficile  Final  Culture, blood (routine x 2)     Status: None   Collection Time: 09/16/15 11:24 AM  Result Value Ref Range Status   Specimen Description BLOOD LEFT HAND  Final   Special Requests BOTTLES DRAWN AEROBIC AND ANAEROBIC  1CC  Final   Culture NO GROWTH 5 DAYS  Final   Report Status 09/21/2015 FINAL  Final  Culture, blood (routine x 2)     Status: None   Collection Time: 09/16/15 12:23 PM  Result Value Ref Range Status   Specimen Description BLOOD RIGHT HAND  Final   Special Requests BOTTLES DRAWN AEROBIC AND ANAEROBIC  1CC  Final   Culture  Setup Time   Final    GRAM POSITIVE COCCI AEROBIC BOTTLE ONLY CRITICAL RESULT CALLED TO, READ BACK BY AND VERIFIED WITH: TESS THOMAS,RN 09/17/2015 0631 BY JRS.    Culture   Final    ENTEROCOCCUS FAECALIS AEROBIC BOTTLE ONLY VRE HAVE INTRINSIC RESISTANCE TO MOST COMMONLY USED ANTIBIOTICS AND THE ABILITY TO ACQUIRE RESISTANCE TO MOST AVAILABLE ANTIBIOTICS. CRITICAL RESULT CALLED TO, READ BACK BY AND VERIFIED WITH: CHERYL SMITH AT [id number] 09/19/15 DV    Report Status 09/21/2015 FINAL  Final   Organism ID, Bacteria ENTEROCOCCUS FAECALIS  Final      Susceptibility   Enterococcus faecalis - MIC*    AMPICILLIN <=2 SENSITIVE Sensitive     LINEZOLID 2 SENSITIVE Sensitive     CIPROFLOXACIN Value in next row Resistant      RESISTANT>=8    TETRACYCLINE Value in next row Resistant      RESISTANT>=16    VANCOMYCIN Value in next row Resistant      RESISTANT>=32    GENTAMICIN SYNERGY Value in next row Resistant      RESISTANT>=32    * ENTEROCOCCUS FAECALIS  Culture, respiratory (NON-Expectorated)     Status: None   Collection Time: 09/16/15  3:50 PM  Result Value Ref Range Status   Specimen Description TRACHEAL ASPIRATE  Final   Special Requests Immunocompromised  Final   Gram Stain   Final     FEW WBC SEEN GOOD SPECIMEN - 80-90% WBCS RARE GRAM NEGATIVE RODS    Culture LIGHT GROWTH PSEUDOMONAS AERUGINOSA  Final   Report Status 09/19/2015 FINAL  Final   Organism ID, Bacteria PSEUDOMONAS AERUGINOSA  Final  Susceptibility   Pseudomonas aeruginosa - MIC*    CEFTAZIDIME 8 SENSITIVE Sensitive     CIPROFLOXACIN 2 INTERMEDIATE Intermediate     GENTAMICIN >=16 RESISTANT Resistant     IMIPENEM >=16 RESISTANT Resistant     PIP/TAZO Value in next row Sensitive      SENSITIVE32    CEFEPIME Value in next row Sensitive      SENSITIVE8    LEVOFLOXACIN Value in next row Resistant      RESISTANT>=8    * LIGHT GROWTH PSEUDOMONAS AERUGINOSA  Culture, expectorated sputum-assessment     Status: None   Collection Time: 09/22/15  2:09 PM  Result Value Ref Range Status   Specimen Description ENDOTRACHEAL  Final   Special Requests Normal  Final   Sputum evaluation THIS SPECIMEN IS ACCEPTABLE FOR SPUTUM CULTURE  Final   Report Status 09/24/2015 FINAL  Final  Culture, respiratory (NON-Expectorated)     Status: None   Collection Time: 09/22/15  2:09 PM  Result Value Ref Range Status   Specimen Description ENDOTRACHEAL  Final   Special Requests Normal Reflexed from [id number]  Final   Gram Stain   Final    FEW WBC SEEN FEW GRAM NEGATIVE RODS POOR SPECIMEN - LESS THAN 70% WBCS    Culture   Final    MODERATE GROWTH PSEUDOMONAS AERUGINOSA LIGHT GROWTH KLEBSIELLA PNEUMONIAE REFER TO SENSITIVITIES FROM PREVIOUS CULTURE FOR ORG 2    Report Status 10/01/2015 FINAL  Final   Organism ID, Bacteria PSEUDOMONAS AERUGINOSA  Final   Organism ID, Bacteria KLEBSIELLA PNEUMONIAE  Final      Susceptibility   Pseudomonas aeruginosa - MIC*    CEFTAZIDIME 4 SENSITIVE Sensitive     CIPROFLOXACIN 2 INTERMEDIATE Intermediate     GENTAMICIN >=16 RESISTANT Resistant     IMIPENEM >=16 RESISTANT Resistant     PIP/TAZO Value in next row Sensitive      SENSITIVE16    * MODERATE GROWTH PSEUDOMONAS AERUGINOSA   Tissue culture     Status: None   Collection Time: 09/24/15  6:44 AM  Result Value Ref Range Status   Specimen Description BONE  Final   Special Requests Normal  Final   Gram Stain MODERATE WBC SEEN FEW GRAM NEGATIVE RODS   Final   Culture   Final    MODERATE GROWTH ESCHERICHIA COLI MODERATE GROWTH KLEBSIELLA PNEUMONIAE ESBL-EXTENDED SPECTRUM BETA LACTAMASE-THE ORGANISM IS RESISTANT TO PENICILLINS, CEPHALOSPORINS AND AZTREONAM ACCORDING TO CLSI M100-S15 VOL.25 N01 JAN 2005. ORGANISM 1 This organism isolate is resistant to one or more antiotic agents in three or more antimicrobial categories.  Suggest Infectious Disease consult.   ORGANISM 2 CRITICAL RESULT CALLED TO, READ BACK BY AND VERIFIED WITH: RN Mardene Celeste LINDSAY 09/27/15 1005AM    Report Status 09/28/2015 FINAL  Final   Organism ID, Bacteria ESCHERICHIA COLI  Final   Organism ID, Bacteria KLEBSIELLA PNEUMONIAE  Final      Susceptibility   Escherichia coli - MIC*    AMPICILLIN >=32 RESISTANT Resistant     CEFTAZIDIME 4 RESISTANT Resistant     CEFAZOLIN >=64 RESISTANT Resistant     CEFTRIAXONE 8 RESISTANT Resistant     GENTAMICIN <=1 SENSITIVE Sensitive     IMIPENEM 8 RESISTANT Resistant     TRIMETH/SULFA <=20 SENSITIVE Sensitive     Extended ESBL POSITIVE Resistant     PIP/TAZO Value in next row Resistant      RESISTANT>=128    * MODERATE GROWTH ESCHERICHIA COLI   Klebsiella pneumoniae -  MIC*    AMPICILLIN Value in next row Resistant      RESISTANT>=128    CEFTAZIDIME Value in next row Resistant      RESISTANT>=128    CEFAZOLIN Value in next row Resistant      RESISTANT>=128    CEFTRIAXONE Value in next row Resistant      RESISTANT>=128    CIPROFLOXACIN Value in next row Resistant      RESISTANT>=128    GENTAMICIN Value in next row Resistant      RESISTANT>=128    IMIPENEM Value in next row Resistant      RESISTANT>=128    TRIMETH/SULFA Value in next row Sensitive      RESISTANT>=128    PIP/TAZO Value in next row  Resistant      RESISTANT>=128    * MODERATE GROWTH KLEBSIELLA PNEUMONIAE  Anaerobic culture     Status: None   Collection Time: 09/24/15  3:55 PM  Result Value Ref Range Status   Specimen Description BONE  Final   Special Requests Normal  Final   Culture NO ANAEROBES ISOLATED  Final   Report Status 09/29/2015 FINAL  Final  Culture, fungus without smear     Status: None   Collection Time: 09/24/15  3:55 PM  Result Value Ref Range Status   Specimen Description BONE  Final   Special Requests Normal  Final   Culture NO FUNGUS ISOLATED AFTER 24 DAYS  Final   Report Status 10/18/2015 FINAL  Final  C difficile quick scan w PCR reflex     Status: None   Collection Time: 10/07/15  2:02 PM  Result Value Ref Range Status   C Diff antigen NEGATIVE NEGATIVE Final   C Diff toxin NEGATIVE NEGATIVE Final   C Diff interpretation Negative for C. difficile  Final  Culture, blood (routine x 2)     Status: None   Collection Time: 10/16/15  9:52 AM  Result Value Ref Range Status   Specimen Description BLOOD LEFT HAND  Final   Special Requests   Final    BOTTLES DRAWN AEROBIC AND ANAEROBIC  AER 6CC ANA 4CC   Culture NO GROWTH 6 DAYS  Final   Report Status 10/22/2015 FINAL  Final  Culture, blood (routine x 2)     Status: None   Collection Time: 10/16/15 12:25 PM  Result Value Ref Range Status   Specimen Description BLOOD LEFT HAND  Final   Special Requests BOTTLES DRAWN AEROBIC AND ANAEROBIC  5CC  Final   Culture NO GROWTH 6 DAYS  Final   Report Status 10/22/2015 FINAL  Final  Culture, expectorated sputum-assessment     Status: None   Collection Time: 10/18/15  1:15 PM  Result Value Ref Range Status   Specimen Description SPUTUM  Final   Special Requests NONE  Final   Sputum evaluation THIS SPECIMEN IS ACCEPTABLE FOR SPUTUM CULTURE  Final   Report Status 10/18/2015 FINAL  Final  Culture, respiratory (NON-Expectorated)     Status: None   Collection Time: 10/18/15  1:15 PM  Result Value Ref  Range Status   Specimen Description SPUTUM  Final   Special Requests NONE Reflexed from [id number]  Final   Gram Stain   Final    GOOD SPECIMEN - 80-90% WBCS MODERATE WBC SEEN MANY GRAM NEGATIVE RODS    Culture   Final    HEAVY GROWTH PSEUDOMONAS AERUGINOSA MODERATE GROWTH KLEBSIELLA PNEUMONIAE CRITICAL RESULT CALLED TO, READ BACK BY AND VERIFIED WITH: Rod Mae  AT 1610 10/21/15 DV KLEBSIELLA PNEUMONIAE This organism isolate is resistant to one or more antiotic agents in three or more antimicrobial categories.  Suggest Infectious Disease  consult.      Report Status 10/22/2015 FINAL  Final   Organism ID, Bacteria PSEUDOMONAS AERUGINOSA  Final   Organism ID, Bacteria KLEBSIELLA PNEUMONIAE  Final      Susceptibility   Klebsiella pneumoniae - MIC*    AMPICILLIN >=32 RESISTANT Resistant     CEFAZOLIN >=64 RESISTANT Resistant     CEFTRIAXONE >=64 RESISTANT Resistant     CIPROFLOXACIN >=4 RESISTANT Resistant     GENTAMICIN >=16 RESISTANT Resistant     IMIPENEM >=16 RESISTANT Resistant     NITROFURANTOIN >=512 RESISTANT Resistant     TRIMETH/SULFA <=20 SENSITIVE Sensitive     * MODERATE GROWTH KLEBSIELLA PNEUMONIAE   Pseudomonas aeruginosa - MIC*    CEFTAZIDIME 4 SENSITIVE Sensitive     CIPROFLOXACIN >=4 RESISTANT Resistant     GENTAMICIN 8 INTERMEDIATE Intermediate     IMIPENEM >=16 RESISTANT Resistant     PIP/TAZO Value in next row Sensitive      SENSITIVE32    * HEAVY GROWTH PSEUDOMONAS AERUGINOSA  C difficile quick scan w PCR reflex     Status: None   Collection Time: 10/18/15  4:39 PM  Result Value Ref Range Status   C Diff antigen NEGATIVE NEGATIVE Final   C Diff toxin NEGATIVE NEGATIVE Final   C Diff interpretation Negative for C. difficile  Final  Culture, blood (Routine X 2) w Reflex to ID Panel     Status: None   Collection Time: 11/06/15  8:27 AM  Result Value Ref Range Status   Specimen Description BLOOD RIGHT HAND  Final   Special Requests BOTTLES DRAWN AEROBIC AND  ANAEROBIC 3CC  Final   Culture NO GROWTH 5 DAYS  Final   Report Status 11/11/2015 FINAL  Final  Culture, blood (Routine X 2) w Reflex to ID Panel     Status: None   Collection Time: 11/06/15  8:29 AM  Result Value Ref Range Status   Specimen Description BLOOD LEFT HAND  Final   Special Requests BOTTLES DRAWN AEROBIC AND ANAEROBIC  1CC  Final   Culture NO GROWTH 5 DAYS  Final   Report Status 11/11/2015 FINAL  Final  Culture, expectorated sputum-assessment     Status: None   Collection Time: 11/06/15  8:41 AM  Result Value Ref Range Status   Specimen Description EXPECTORATED SPUTUM  Final   Special Requests Immunocompromised  Final   Sputum evaluation THIS SPECIMEN IS ACCEPTABLE FOR SPUTUM CULTURE  Final   Report Status 11/06/2015 FINAL  Final  Culture, respiratory (NON-Expectorated)     Status: None   Collection Time: 11/06/15  8:41 AM  Result Value Ref Range Status   Specimen Description EXPECTORATED SPUTUM  Final   Special Requests Immunocompromised Reflexed from [id number]  Final   Gram Stain   Final    MANY WBC SEEN MANY GRAM NEGATIVE RODS EXCELLENT SPECIMEN - 90-100% WBCS    Culture   Final    MODERATE GROWTH KLEBSIELLA PNEUMONIAE MODERATE GROWTH PSEUDOMONAS AERUGINOSA CRITICAL RESULT CALLED TO, READ BACK BY AND VERIFIED WITH: DR. FITZGERALD AT 1120 11/09/15 DV DR. FITZGERALD REQUESTED ORGANISM BE SENT OUT FOR TIGECYCLINE CEFT/AVIBACTAM AND COLISTIN Sent to [facility] for further susceptibility testing. 11/11/15 CTJ    Report Status 11/15/2015 FINAL  Final   Organism ID, Bacteria KLEBSIELLA PNEUMONIAE  Final   Organism ID, Bacteria  PSEUDOMONAS AERUGINOSA  Final      Susceptibility   Klebsiella pneumoniae - MIC*    AMPICILLIN >=32 RESISTANT Resistant     CEFAZOLIN >=64 RESISTANT Resistant     CEFTRIAXONE 32 RESISTANT Resistant     CIPROFLOXACIN >=4 RESISTANT Resistant     GENTAMICIN >=16 RESISTANT Resistant     IMIPENEM >=16 RESISTANT Resistant     NITROFURANTOIN 256  RESISTANT Resistant     TRIMETH/SULFA >=320 RESISTANT Resistant     Extended ESBL POSITIVE Resistant     * MODERATE GROWTH KLEBSIELLA PNEUMONIAE   Pseudomonas aeruginosa - MIC*    CEFTAZIDIME >=64 RESISTANT Resistant     CIPROFLOXACIN 2 INTERMEDIATE Intermediate     GENTAMICIN >=16 RESISTANT Resistant     IMIPENEM >=16 RESISTANT Resistant     * MODERATE GROWTH PSEUDOMONAS AERUGINOSA  Culture, respiratory (NON-Expectorated)     Status: None   Collection Time: 11/14/15  2:07 PM  Result Value Ref Range Status   Specimen Description TRACHEAL ASPIRATE  Final   Special Requests NONE  Final   Gram Stain   Final    EXCELLENT SPECIMEN - 90-100% WBCS MANY WBC SEEN MODERATE GRAM NEGATIVE RODS    Culture   Final    HEAVY GROWTH PSEUDOMONAS AERUGINOSA CRITICAL VALUE NOTED.  VALUE IS CONSISTENT WITH PREVIOUSLY REPORTED AND CALLED VALUE.    Report Status 11/16/2015 FINAL  Final   Organism ID, Bacteria PSEUDOMONAS AERUGINOSA  Final      Susceptibility   Pseudomonas aeruginosa - MIC*    CEFTAZIDIME >=64 RESISTANT Resistant     CIPROFLOXACIN 2 INTERMEDIATE Intermediate     GENTAMICIN >=16 RESISTANT Resistant     IMIPENEM >=16 RESISTANT Resistant     PIP/TAZO Value in next row Resistant      RESISTANT>=128    TOBRAMYCIN Value in next row Sensitive      SENSITIVE2    * HEAVY GROWTH PSEUDOMONAS AERUGINOSA    Coagulation Studies: No results for input(s): LABPROT, INR in the last 72 hours.  Urinalysis: No results for input(s): COLORURINE, LABSPEC, PHURINE, GLUCOSEU, HGBUR, BILIRUBINUR, KETONESUR, PROTEINUR, UROBILINOGEN, NITRITE, LEUKOCYTESUR in the last 72 hours.  Invalid input(s): APPERANCEUR    Imaging: No results found.   Medications:   . dextrose 5 % and 0.9% NaCl 50 mL/hr at 11/17/15 0517  . feeding supplement (JEVITY 1.5 CAL/FIBER) 1,000 mL (11/15/15 1154)  . norepinephrine (LEVOPHED) Adult infusion     . [START ON 11/18/2015] amiodarone  200 mg Per Tube BID   Followed by   . [START ON 11/25/2015] amiodarone  200 mg Per Tube Daily  . amiodarone  400 mg Per Tube BID  . antiseptic oral rinse  7 mL Mouth Rinse QID  . aspirin  81 mg Oral Daily  . chlorhexidine gluconate  15 mL Mouth Rinse BID  . colistimethate (COLISTIN) IV  200 mg Intravenous Q48H  . epoetin (EPOGEN/PROCRIT) injection  10,000 Units Intravenous Q M,W,F-HD  . escitalopram  10 mg Oral Daily  . feeding supplement (PRO-STAT SUGAR FREE 64)  30 mL Oral 6 X Daily  . free water  20 mL Per Tube 6 times per day  . heparin subcutaneous  5,000 Units Subcutaneous Q12H  . hydrocortisone  10 mg Per Tube BID  . lidocaine  1 patch Transdermal Q24H  . midodrine  10 mg Per Tube TID WC  . mirtazapine  7.5 mg Oral QHS  . multivitamin  5 mL Oral Daily  . pantoprazole sodium  40 mg  Per Tube Daily  . saccharomyces boulardii  250 mg Oral BID  . sodium chloride  10-40 mL Intracatheter Q12H  . sodium hypochlorite   Irrigation BID  . tigecycline (TYGACIL) IVPB  50 mg Intravenous Q12H   acetaminophen (TYLENOL) oral liquid 160 mg/5 mL, HYDROmorphone (DILAUDID) injection, ipratropium-albuterol, loperamide, morphine injection, ondansetron (ZOFRAN) IV, oxyCODONE, polyvinyl alcohol, sodium chloride, zinc oxide  Assessment/ Plan:  50 y.o. 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 Admitted on Aug 26, 2015  Echo: 11/07/19: moderately to severely reduced. The  estimated ejection fraction was in the range of 30% to 35%. Regional wall motion abnormalities: Severe hypokinesis of the anterior and anteroseptal myocardium.  1. End-stage renal disease on hemodialysis on HD MWF. The patient has been on dialysis since October of 2014. R IJ permcath. Dialysis treatment with IV albumin for oncotic support. Midodrine and hydrocortisone ordered for blood pressure support.  - Patient had HD yesterday, UF was 1.1kg, no acute indication for HD today, will plan for HD again tomorrow.  Albumin was used yesterday, continue to use as needed.  2. Anemia of CKD.  - Hemoglobin remains low despite use of Epogen and periodic blood transfusions.  - last hgb was 11, ? If erroneous.  3. Acute resp failure:  -prolonged course on ventilator, fio2 currently 40% with PEEP of 5.   4. Sepsis: VRE in blood. Bone cultures E. Coli and klebsiella  - Currently on colistimethate and  tigecycline.   5. Generalized edema/anasarca Difficult to achieve more than 1-1.5kg of UF per HD treatment.  We are currently using low BFRs (250), midodrine, albumin to help achieve UF.        LOS: 97 Jaylon Grode 12/29/20168:37 AM

## 2015-11-17 NOTE — Progress Notes (Signed)
MEDICATION RELATED CONSULT NOTE - Follow up   Pharmacy Consult for Renal dosing  Allergies  Allergen Reactions  . Contrast Media [Iodinated Diagnostic Agents] Anaphylaxis  . Ampicillin Rash   Patient Measurements: Ht 595ft 9in Height:  (SCALE BROKE) Weight:  (unable to weigh bed is broken) IBW/kg (Calculated) : 66.2  Vital Signs: Temp: 98.1 F (36.7 C) (12/29 0800) Temp Source: Axillary (12/29 0800) BP: 108/48 mmHg (12/29 0800) Pulse Rate: 70 (12/29 0800)  Recent Labs  11/16/15 1013  WBC 34.3*  HGB 11.0*  HCT 38.1  PLT 199   Estimated Creatinine Clearance: 44.7 mL/min (by C-G formula based on Cr of 1.71).  Assessment: 49 yo patient on HD. Pharmacy consulted for renal dosing of medications.   Plan:  No medications require adjustment at present. Pharmacy will continue to monitor and dose adjust as needed.    Luisa HartScott Vinicio Lynk, PharmD   11/17/2015

## 2015-11-17 NOTE — Progress Notes (Signed)
eLink Physician-Brief Progress Note Patient Name: Courtney [name] DOB: 25-Aug-1966 MRN: [id number]   Date of Service  11/17/2015  HPI/Events of Note  Persistent hypoglycemia after d/c of TFs.  Now has CBG of 58  eICU Interventions  Plan: 1 amp of D50 IVP now Increase IVFs with dextrose to 50 cc/hr Continue to monitor blood sugars     Intervention Category Intermediate Interventions: Other:  Chevi Lim 11/17/2015, 12:59 AM

## 2015-11-17 NOTE — Progress Notes (Signed)
BG 58 at midnight. Tube feeds off due to vomiting 12/27, IV fluids were at 40 , E link notified and d50 pushed IV and fluids increased to 50 mls and BG rechecked , now 81. Will continue to monitor. MAP above 55 at this time, Levophed is now off. Medicated earlier for mild fever with tylenol po, now temp decreased from 100.7 to 99.4

## 2015-11-18 LAB — BASIC METABOLIC PANEL
ANION GAP: 6 (ref 5–15)
BUN: 41 mg/dL — ABNORMAL HIGH (ref 6–20)
CALCIUM: 8.1 mg/dL — AB (ref 8.9–10.3)
CO2: 30 mmol/L (ref 22–32)
Chloride: 101 mmol/L (ref 101–111)
Creatinine, Ser: 0.98 mg/dL (ref 0.44–1.00)
Glucose, Bld: 105 mg/dL — ABNORMAL HIGH (ref 65–99)
Potassium: 3.2 mmol/L — ABNORMAL LOW (ref 3.5–5.1)
SODIUM: 137 mmol/L (ref 135–145)

## 2015-11-18 LAB — CBC
HCT: 29 % — ABNORMAL LOW (ref 35.0–47.0)
Hemoglobin: 8.7 g/dL — ABNORMAL LOW (ref 12.0–16.0)
MCH: 28.6 pg (ref 26.0–34.0)
MCHC: 30.1 g/dL — ABNORMAL LOW (ref 32.0–36.0)
MCV: 95 fL (ref 80.0–100.0)
PLATELETS: 280 10*3/uL (ref 150–440)
RBC: 3.05 MIL/uL — AB (ref 3.80–5.20)
RDW: 19.5 % — ABNORMAL HIGH (ref 11.5–14.5)
WBC: 31.3 10*3/uL — AB (ref 3.6–11.0)

## 2015-11-18 LAB — COMPREHENSIVE METABOLIC PANEL
ALBUMIN: 1.6 g/dL — AB (ref 3.5–5.0)
ALT: 13 U/L — ABNORMAL LOW (ref 14–54)
ANION GAP: 10 (ref 5–15)
AST: 17 U/L (ref 15–41)
Alkaline Phosphatase: 1309 U/L — ABNORMAL HIGH (ref 38–126)
BILIRUBIN TOTAL: 0.7 mg/dL (ref 0.3–1.2)
BUN: 58 mg/dL — AB (ref 6–20)
CHLORIDE: 101 mmol/L (ref 101–111)
CO2: 28 mmol/L (ref 22–32)
Calcium: 8.2 mg/dL — ABNORMAL LOW (ref 8.9–10.3)
Creatinine, Ser: 1.29 mg/dL — ABNORMAL HIGH (ref 0.44–1.00)
GFR calc Af Amer: 55 mL/min — ABNORMAL LOW (ref >60)
GFR, EST NON AFRICAN AMERICAN: 47 mL/min — AB (ref >60)
GLUCOSE: 109 mg/dL — AB (ref 65–99)
POTASSIUM: 3.7 mmol/L (ref 3.5–5.1)
Sodium: 139 mmol/L (ref 135–145)
TOTAL PROTEIN: 5.6 g/dL — AB (ref 6.5–8.1)

## 2015-11-18 LAB — GLUCOSE, CAPILLARY
GLUCOSE-CAPILLARY: 103 mg/dL — AB (ref 65–99)
GLUCOSE-CAPILLARY: 106 mg/dL — AB (ref 65–99)
GLUCOSE-CAPILLARY: 109 mg/dL — AB (ref 65–99)
GLUCOSE-CAPILLARY: 99 mg/dL (ref 65–99)

## 2015-11-18 LAB — PHOSPHORUS: PHOSPHORUS: 4.1 mg/dL (ref 2.5–4.6)

## 2015-11-18 LAB — MAGNESIUM
MAGNESIUM: 1.6 mg/dL — AB (ref 1.7–2.4)
Magnesium: 1.7 mg/dL (ref 1.7–2.4)

## 2015-11-18 LAB — TROPONIN I: TROPONIN I: 0.06 ng/mL — AB (ref <0.031)

## 2015-11-18 MED ORDER — SODIUM CHLORIDE 0.9 % IV SOLN
250.0000 mg | INTRAVENOUS | Status: DC
  Administered 2015-11-18 – 2015-12-06 (×10): 250 mg via INTRAVENOUS
  Filled 2015-11-18 (×10): qty 3.33

## 2015-11-18 MED ORDER — JEVITY 1.5 CAL/FIBER PO LIQD
1000.0000 mL | ORAL | Status: DC
  Administered 2015-11-20 (×2): 1000 mL

## 2015-11-18 MED ORDER — METOPROLOL TARTRATE 25 MG PO TABS
12.5000 mg | ORAL_TABLET | Freq: Two times a day (BID) | ORAL | Status: DC
  Administered 2015-11-18 – 2015-11-22 (×7): 12.5 mg via ORAL
  Filled 2015-11-18 (×7): qty 1

## 2015-11-18 MED ORDER — TOBRAMYCIN 300 MG/5ML IN NEBU
300.0000 mg | INHALATION_SOLUTION | Freq: Two times a day (BID) | RESPIRATORY_TRACT | Status: DC
  Administered 2015-11-18 – 2015-12-06 (×37): 300 mg via RESPIRATORY_TRACT
  Filled 2015-11-18 (×45): qty 5

## 2015-11-18 MED ORDER — ALBUMIN HUMAN 25 % IV SOLN
12.5000 g | Freq: Once | INTRAVENOUS | Status: AC
  Administered 2015-11-18: 12.5 g via INTRAVENOUS
  Filled 2015-11-18: qty 50

## 2015-11-18 MED ORDER — POTASSIUM CHLORIDE 10 MEQ/50ML IV SOLN
10.0000 meq | INTRAVENOUS | Status: AC
  Administered 2015-11-18 (×2): 10 meq via INTRAVENOUS
  Filled 2015-11-18 (×2): qty 50

## 2015-11-18 MED ORDER — MAGNESIUM SULFATE 2 GM/50ML IV SOLN
2.0000 g | Freq: Once | INTRAVENOUS | Status: AC
  Administered 2015-11-18: 2 g via INTRAVENOUS
  Filled 2015-11-18: qty 50

## 2015-11-18 MED ORDER — AMIODARONE IV BOLUS ONLY 150 MG/100ML
150.0000 mg | Freq: Once | INTRAVENOUS | Status: AC
  Administered 2015-11-18: 150 mg via INTRAVENOUS
  Filled 2015-11-18: qty 100

## 2015-11-18 NOTE — Care Management (Addendum)
Spoke with Tomasa Randarol Harris today about what has been verbally reported to CM.  Patient has nodded 'No' when asked if wanted a bath or dialysis.She nodded yes when asked if she wanted staff "to tell Jonny RuizJohn she did have a bath" when she had just declined) and she nodded yes.   She "blew a kiss" to a night last.     She has a significant increase in secretions and requiring frequent suctioning.  Copious amount of sputum being suctioned.   Latest sputum culture:    pseudomonas aerginosa.  Tobramycin nebulizer treatment started today and will renain on Colistin and Tigecycline until sensitivity testing is complete.   Have discussed psych's recommendation to encourage patient  to participate in care decisions.  Discussed another functional neuro assessment to determine if there has been improvement in patient's ability to process information.  Discussed it is a known fact that patient's abilities can change from day to day.. hour to hour and some days/times patient is very engaged and other times she is not.  Patient is not taking in any nutrition now.  Tube feedings stopped due to vomiting.  Currently on D10  in hopes of keeping blood sugar up. Discussed the outcome of the ethics committee meeting that was to be held with team members and physicians is not known.  Team members are verbalizing that the care that is being provided is not promoting well being but prolonging pain and misery.  Continuing to provide futile care to this patient is causing  caregiver stress due to morale and ethic values of team members. A code blue was called earlier this evening.  Patient when into a significant run of ventricular tachycardia.  She converted back to sinus rhythm spontaneously - she did not require electric shock.  This CM contacted patient's husband to report the event: CM had to clarify that - after being suctioned- HOB 30 degrees.... experienced episode of ventricular tachycardia- Code Blue was called..patient was not shocked.   Labs are being drawn.  medications being reviewed... He asked that the nurse that is assigned to patient tonight be familiar with patient.  Informed him that the nurse tonight will be Cathy.  John said he would not be able to come to hospital tonight as he is down with a cold.  He will try to come tomorrow.  Discussed need to protect Mekaylah from further infections .  John verbalized understanding and says if he is able to come, he will stay a short while and just stand at the door with a maks.

## 2015-11-18 NOTE — Progress Notes (Signed)
Pt continues having copious amounts of yellow, thick secretions; therefore, ET tube suctioning performed. Following ET tube suctioning pts cardiac rhythm has changed to pulsatile ventricular tachycardia. Pt became unresponsive. A code was called and E Link was notified Dr Cherly BeachSomers cameraed in to pts room. ED MD came to bedside for assessment. Pt spontaneously converted to NSR with multifocal PVC's. No shock or chest compressions were delivered. Per Dr Cherly BeachSomers verbal order via Pola CornELink one time Amiodarone bolus administered. BMP and CBC results pending. Troponin results of 0.06 were reported to Janifer AdieMarsha T., Dr Cherly BeachSomers nurse. Dr Cherly BeachSomers unavailable at the time; Mindi JunkerMarsha T. Will report results when he is available. Husband notified by care manager, Elana AlmNan of the change in pts condition. Per Dr Belia HemanKasa, there are plans to replace current trach with a distal XLT. Due to pt instability dressing changes were not performed, will make night shift nurse aware when pt becomes stable will address pts dressing changes at this time.

## 2015-11-18 NOTE — Progress Notes (Signed)
ANTIBIOTIC CONSULT NOTE - follow Up  Pharmacy Consult for Colistin and Tigecycline  Indication: MDR Respiratory Infection   Allergies  Allergen Reactions  . Contrast Media [Iodinated Diagnostic Agents] Anaphylaxis  . Ampicillin Rash   Patient Measurements: Height:  (SCALE BROKE) Weight: 227 lb 1.2 oz (103 kg) IBW/kg (Calculated) : 66.2  Vital Signs: Temp: 98.2 F (36.8 C) (12/30 1200) Temp Source: Axillary (12/30 1200) BP: 125/76 mmHg (12/30 1300) Pulse Rate: 159 (12/30 1200) Intake/Output from previous day: 12/29 0701 - 12/30 0700 In: 1550 [I.V.:1290; NG/GT:60; IV Piggyback:200] Out: -  Intake/Output from this shift: Total I/O In: 40 [I.V.:20; NG/GT:20] Out: -1000   Recent Labs  11/16/15 1013 11/18/15 0830 11/18/15 1205  WBC 34.3*  --  31.3*  HGB 11.0*  --  8.7*  PLT 199  --  280  CREATININE  --  1.29*  --    Estimated Creatinine Clearance: 66.6 mL/min (by C-G formula based on Cr of 1.29). No results for input(s): VANCOTROUGH, VANCOPEAK, VANCORANDOM, GENTTROUGH, GENTPEAK, GENTRANDOM, TOBRATROUGH, TOBRAPEAK, TOBRARND, AMIKACINPEAK, AMIKACINTROU, AMIKACIN in the last 72 hours.   Microbiology: Recent Results (from the past 720 hour(s))  Culture, blood (Routine X 2) w Reflex to ID Panel     Status: None   Collection Time: 11/06/15  8:27 AM  Result Value Ref Range Status   Specimen Description BLOOD RIGHT HAND  Final   Special Requests BOTTLES DRAWN AEROBIC AND ANAEROBIC 3CC  Final   Culture NO GROWTH 5 DAYS  Final   Report Status 11/11/2015 FINAL  Final  Culture, blood (Routine X 2) w Reflex to ID Panel     Status: None   Collection Time: 11/06/15  8:29 AM  Result Value Ref Range Status   Specimen Description BLOOD LEFT HAND  Final   Special Requests BOTTLES DRAWN AEROBIC AND ANAEROBIC  1CC  Final   Culture NO GROWTH 5 DAYS  Final   Report Status 11/11/2015 FINAL  Final  Culture, expectorated sputum-assessment     Status: None   Collection Time: 11/06/15  8:41  AM  Result Value Ref Range Status   Specimen Description EXPECTORATED SPUTUM  Final   Special Requests Immunocompromised  Final   Sputum evaluation THIS SPECIMEN IS ACCEPTABLE FOR SPUTUM CULTURE  Final   Report Status 11/06/2015 FINAL  Final  Culture, respiratory (NON-Expectorated)     Status: None   Collection Time: 11/06/15  8:41 AM  Result Value Ref Range Status   Specimen Description EXPECTORATED SPUTUM  Final   Special Requests Immunocompromised Reflexed from [id number]  Final   Gram Stain   Final    MANY WBC SEEN MANY GRAM NEGATIVE RODS EXCELLENT SPECIMEN - 90-100% WBCS    Culture   Final    MODERATE GROWTH KLEBSIELLA PNEUMONIAE MODERATE GROWTH PSEUDOMONAS AERUGINOSA CRITICAL RESULT CALLED TO, READ BACK BY AND VERIFIED WITH: DR. FITZGERALD AT 1120 11/09/15 DV DR. FITZGERALD REQUESTED ORGANISM BE SENT OUT FOR TIGECYCLINE CEFT/AVIBACTAM AND COLISTIN Sent to [facility] for further susceptibility testing. 11/11/15 CTJ    Report Status 11/15/2015 FINAL  Final   Organism ID, Bacteria KLEBSIELLA PNEUMONIAE  Final   Organism ID, Bacteria PSEUDOMONAS AERUGINOSA  Final      Susceptibility   Klebsiella pneumoniae - MIC*    AMPICILLIN >=32 RESISTANT Resistant     CEFAZOLIN >=64 RESISTANT Resistant     CEFTRIAXONE 32 RESISTANT Resistant     CIPROFLOXACIN >=4 RESISTANT Resistant     GENTAMICIN >=16 RESISTANT Resistant     IMIPENEM >=16  RESISTANT Resistant     NITROFURANTOIN 256 RESISTANT Resistant     TRIMETH/SULFA >=320 RESISTANT Resistant     Extended ESBL POSITIVE Resistant     * MODERATE GROWTH KLEBSIELLA PNEUMONIAE   Pseudomonas aeruginosa - MIC*    CEFTAZIDIME >=64 RESISTANT Resistant     CIPROFLOXACIN 2 INTERMEDIATE Intermediate     GENTAMICIN >=16 RESISTANT Resistant     IMIPENEM >=16 RESISTANT Resistant     * MODERATE GROWTH PSEUDOMONAS AERUGINOSA  Culture, respiratory (NON-Expectorated)     Status: None   Collection Time: 11/14/15  2:07 PM  Result Value Ref Range Status    Specimen Description TRACHEAL ASPIRATE  Final   Special Requests NONE  Final   Gram Stain   Final    EXCELLENT SPECIMEN - 90-100% WBCS MANY WBC SEEN MODERATE GRAM NEGATIVE RODS    Culture   Final    HEAVY GROWTH PSEUDOMONAS AERUGINOSA CRITICAL VALUE NOTED.  VALUE IS CONSISTENT WITH PREVIOUSLY REPORTED AND CALLED VALUE.    Report Status 11/16/2015 FINAL  Final   Organism ID, Bacteria PSEUDOMONAS AERUGINOSA  Final      Susceptibility   Pseudomonas aeruginosa - MIC*    CEFTAZIDIME >=64 RESISTANT Resistant     CIPROFLOXACIN 2 INTERMEDIATE Intermediate     GENTAMICIN >=16 RESISTANT Resistant     IMIPENEM >=16 RESISTANT Resistant     PIP/TAZO Value in next row Resistant      RESISTANT>=128    TOBRAMYCIN Value in next row Sensitive      SENSITIVE2    * HEAVY GROWTH PSEUDOMONAS AERUGINOSA    Medical History: Past Medical History  Diagnosis Date  . Obesity   . Dyslipidemia   . Hypertension   . Coronary artery disease     s/p BMS 2010 LAD  . Dysrhythmia     ventricular tachycardia resolved after LAD stent and beta blocker  . Diabetes mellitus     with retinopathy, neuropathy and microalbuminemia  . ESRD (end stage renal disease) on dialysis    Assessment: 49 yo female with chronic trach, ESRD, and  profound debilitation, needing treatment for MDR Respiratory infection. Patient is on day 93 of hospital stay, and has been treated for multiple infections during this time.   11/06/15: Respiratory cultures showing Pan-resistant  Klebsiella pneumoniae and Pseudomonas aeruginosa  Sputum has been sent to [facility] for susceptibility testing per Dr. Blane Ohara request.   Patient has increased respiratory secretions, leukocytosis and low grade fever   12/30: Alk phos elevated at 1309  Plan:  Sensitivity results for Avycaz, tigecycline, and colistin still  pending.   Continue tigecycline '50mg'$  IV q12h.   Patients weight has been updated to 103kg. Increased  colistimethate  To '250mg'$   (2.5 mg/kg) IV q48 hours.   Patient should be monitored for nephrotoxicity and neurotoxicity.  Pharmacy to continue to follow per consult.      Nancy Fetter, PharmD Pharmacy Resident 11/18/2015 1:47 PM

## 2015-11-18 NOTE — Progress Notes (Signed)
Subjective:  Seen and examined on hemodialysis. Tolerating treatment well. UF goal of 1.   PRVC 40%  Wbc 34.3 K - new wbc pending.   Objective:  Vital signs in last 24 hours:  Temp:  [97.5 F (36.4 C)-98.8 F (37.1 C)] 97.5 F (36.4 C) (12/30 0858) Pulse Rate:  [67-80] 80 (12/30 0858) Resp:  [0-31] 16 (12/30 0900) BP: (113-136)/(48-101) 121/101 mmHg (12/30 0930) SpO2:  [99 %-100 %] 100 % (12/30 0903) FiO2 (%):  [40 %] 40 % (12/30 0903) Weight:  [103 kg (227 lb 1.2 oz)] 103 kg (227 lb 1.2 oz) (12/30 0459)  Weight change:  Filed Weights   11/17/15 1400 11/18/15 0459  Weight: 103 kg (227 lb 1.2 oz) 103 kg (227 lb 1.2 oz)    Intake/Output: I/O last 3 completed shifts: In: 1455.2 [I.V.:1135.2; Other:60; NG/GT:60; IV Piggyback:200] Out: 0    Intake/Output this shift:     Physical Exam: General: critically ill appearing  Head/ENT: OM moist, NG tube  Eyes: Anicteric, eyes closed  Neck: Tracheostomy in place  Lungs:  bilatearl rhonchi, vent assisted fio2 40% PEEP 5  Heart: S1S2 no rubs  Abdomen:  Soft, nontender, BS present  Extremities: 2+ dependent edema, anasarca  Neurologic: Resting comfortably at the moment  Access:  R IJ permcath.       Basic Metabolic Panel:  Recent Labs Lab 11/14/15 0855  NA 141  K 4.8  CL 100*  CO2 31  GLUCOSE 87  BUN 87*  CREATININE 1.71*  CALCIUM 8.5*  PHOS 4.7*    Liver Function Tests:  Recent Labs Lab 11/14/15 0855  ALBUMIN 1.6*   No results for input(s): LIPASE, AMYLASE in the last 168 hours. No results for input(s): AMMONIA in the last 168 hours.  CBC:  Recent Labs Lab 11/14/15 0512 11/14/15 0855 11/16/15 1013  WBC 19.9* 19.1* 34.3*  HGB 7.3* 7.2* 11.0*  HCT 24.3* 24.1* 38.1  MCV 94.3 94.9 100.4*  PLT 238 242 199    Cardiac Enzymes: No results for input(s): CKTOTAL, CKMB, CKMBINDEX, TROPONINI in the last 168 hours.  BNP: Invalid input(s): POCBNP  CBG:  Recent Labs Lab 11/17/15 0631  11/17/15 0712 11/17/15 1655 11/17/15 2352 11/18/15 0743  GLUCAP 62* 70 85 93 103*    Microbiology: Results for orders placed or performed during the hospital encounter of 07/27/2015  Blood Culture (routine x 2)     Status: None   Collection Time: 07/24/2015  8:51 AM  Result Value Ref Range Status   Specimen Description BLOOD [name]  Final   Special Requests BOTTLES DRAWN AEROBIC AND ANAEROBIC  3CC  Final   Culture NO GROWTH 5 DAYS  Final   Report Status 08/17/2015 FINAL  Final  Blood Culture (routine x 2)     Status: None   Collection Time: 07/24/2015  9:20 AM  Result Value Ref Range Status   Specimen Description BLOOD LEFT ARM  Final   Special Requests BOTTLES DRAWN AEROBIC AND ANAEROBIC  1CC  Final   Culture NO GROWTH 5 DAYS  Final   Report Status 08/17/2015 FINAL  Final  Wound culture     Status: None   Collection Time: 08/08/2015  9:20 AM  Result Value Ref Range Status   Specimen Description DECUBITIS  Final   Special Requests Normal  Final   Gram Stain   Final    FEW WBC SEEN MANY GRAM NEGATIVE RODS RARE GRAM POSITIVE COCCI    Culture   Final  HEAVY GROWTH ESCHERICHIA COLI MODERATE GROWTH ENTEROBACTER AEROGENES PROTEUS MIRABILIS HEAVY GROWTH ENTEROCOCCUS SPECIES VRE HAVE INTRINSIC RESISTANCE TO MOST COMMONLY USED ANTIBIOTICS AND THE ABILITY TO ACQUIRE RESISTANCE TO MOST AVAILABLE ANTIBIOTICS.    Report Status 08/16/2015 FINAL  Final   Organism ID, Bacteria ESCHERICHIA COLI  Final   Organism ID, Bacteria ENTEROBACTER AEROGENES  Final   Organism ID, Bacteria PROTEUS MIRABILIS  Final   Organism ID, Bacteria ENTEROCOCCUS SPECIES  Final      Susceptibility   Enterobacter aerogenes - MIC*    CEFTAZIDIME <=1 SENSITIVE Sensitive     CEFAZOLIN >=64 RESISTANT Resistant     CEFTRIAXONE <=1 SENSITIVE Sensitive     CIPROFLOXACIN <=0.25 SENSITIVE Sensitive     GENTAMICIN <=1 SENSITIVE Sensitive     IMIPENEM 1 SENSITIVE Sensitive     TRIMETH/SULFA <=20 SENSITIVE Sensitive     *  MODERATE GROWTH ENTEROBACTER AEROGENES   Escherichia coli - MIC*    AMPICILLIN <=2 SENSITIVE Sensitive     CEFTAZIDIME <=1 SENSITIVE Sensitive     CEFAZOLIN <=4 SENSITIVE Sensitive     CEFTRIAXONE <=1 SENSITIVE Sensitive     CIPROFLOXACIN <=0.25 SENSITIVE Sensitive     GENTAMICIN <=1 SENSITIVE Sensitive     IMIPENEM <=0.25 SENSITIVE Sensitive     TRIMETH/SULFA <=20 SENSITIVE Sensitive     * HEAVY GROWTH ESCHERICHIA COLI   Proteus mirabilis - MIC*    AMPICILLIN >=32 RESISTANT Resistant     CEFTAZIDIME <=1 SENSITIVE Sensitive     CEFAZOLIN 8 SENSITIVE Sensitive     CEFTRIAXONE <=1 SENSITIVE Sensitive     CIPROFLOXACIN <=0.25 SENSITIVE Sensitive     GENTAMICIN <=1 SENSITIVE Sensitive     IMIPENEM 1 SENSITIVE Sensitive     TRIMETH/SULFA <=20 SENSITIVE Sensitive     * PROTEUS MIRABILIS   Enterococcus species - MIC*    AMPICILLIN >=32 RESISTANT Resistant     VANCOMYCIN >=32 RESISTANT Resistant     GENTAMICIN SYNERGY SENSITIVE Sensitive     TETRACYCLINE Value in next row Resistant      RESISTANT>=16    * HEAVY GROWTH ENTEROCOCCUS SPECIES  MRSA PCR Screening     Status: None   Collection Time: 08/06/2015  2:38 PM  Result Value Ref Range Status   MRSA by PCR NEGATIVE NEGATIVE Final    Comment:        The GeneXpert MRSA Assay (FDA approved for NASAL specimens only), is one component of a comprehensive MRSA colonization surveillance program. It is not intended to diagnose MRSA infection nor to guide or monitor treatment for MRSA infections.   Blood culture (single)     Status: None   Collection Time: 08/15/2015  3:36 PM  Result Value Ref Range Status   Specimen Description BLOOD RIGHT ASSIST CONTROL  Final   Special Requests BOTTLES DRAWN AEROBIC AND ANAEROBIC  Final   Culture NO GROWTH 5 DAYS  Final   Report Status 08/17/2015 FINAL  Final  Wound culture     Status: None   Collection Time: 08/13/15 12:37 PM  Result Value Ref Range Status   Specimen Description WOUND  Final    Special Requests Normal  Final   Gram Stain   Final    FEW WBC SEEN TOO NUMEROUS TO COUNT GRAM NEGATIVE RODS FEW GRAM POSITIVE COCCI    Culture   Final    HEAVY GROWTH ESCHERICHIA COLI MODERATE GROWTH PROTEUS MIRABILIS LIGHT GROWTH KLEBSIELLA PNEUMONIAE MODERATE GROWTH ENTEROCOCCUS GALLINARUM CRITICAL RESULT CALLED TO, READ BACK BY  AND VERIFIED WITH: Cleda Clarks AT 1042 08/16/15 DV    Report Status 08/17/2015 FINAL  Final   Organism ID, Bacteria ESCHERICHIA COLI  Final   Organism ID, Bacteria PROTEUS MIRABILIS  Final   Organism ID, Bacteria KLEBSIELLA PNEUMONIAE  Final   Organism ID, Bacteria ENTEROCOCCUS GALLINARUM  Final      Susceptibility   Escherichia coli - MIC*    AMPICILLIN >=32 RESISTANT Resistant     CEFTAZIDIME 4 RESISTANT Resistant     CEFAZOLIN >=64 RESISTANT Resistant     CEFTRIAXONE 16 RESISTANT Resistant     GENTAMICIN 2 SENSITIVE Sensitive     IMIPENEM >=16 RESISTANT Resistant     TRIMETH/SULFA <=20 SENSITIVE Sensitive     Extended ESBL POSITIVE Resistant     PIP/TAZO Value in next row Resistant      RESISTANT>=128    CIPROFLOXACIN Value in next row Sensitive      SENSITIVE<=0.25    * HEAVY GROWTH ESCHERICHIA COLI   Klebsiella pneumoniae - MIC*    AMPICILLIN Value in next row Resistant      SENSITIVE<=0.25    CEFTAZIDIME Value in next row Resistant      SENSITIVE<=0.25    CEFAZOLIN Value in next row Resistant      SENSITIVE<=0.25    CEFTRIAXONE Value in next row Resistant      SENSITIVE<=0.25    CIPROFLOXACIN Value in next row Resistant      SENSITIVE<=0.25    GENTAMICIN Value in next row Sensitive      SENSITIVE<=0.25    IMIPENEM Value in next row Resistant      SENSITIVE<=0.25    TRIMETH/SULFA Value in next row Resistant      SENSITIVE<=0.25    PIP/TAZO Value in next row Resistant      RESISTANT>=128    * LIGHT GROWTH KLEBSIELLA PNEUMONIAE   Proteus mirabilis - MIC*    AMPICILLIN Value in next row Resistant      RESISTANT>=128     CEFTAZIDIME Value in next row Sensitive      RESISTANT>=128    CEFAZOLIN Value in next row Sensitive      RESISTANT>=128    CEFTRIAXONE Value in next row Sensitive      RESISTANT>=128    CIPROFLOXACIN Value in next row Sensitive      RESISTANT>=128    GENTAMICIN Value in next row Sensitive      RESISTANT>=128    IMIPENEM Value in next row Sensitive      RESISTANT>=128    TRIMETH/SULFA Value in next row Sensitive      RESISTANT>=128    PIP/TAZO Value in next row Sensitive      SENSITIVE<=4    * MODERATE GROWTH PROTEUS MIRABILIS   Enterococcus gallinarum - MIC*    AMPICILLIN Value in next row Resistant      SENSITIVE<=4    GENTAMICIN SYNERGY Value in next row Sensitive      SENSITIVE<=4    CIPROFLOXACIN Value in next row Resistant      RESISTANT>=8    TETRACYCLINE Value in next row Resistant      RESISTANT>=16    * MODERATE GROWTH ENTEROCOCCUS GALLINARUM  Wound culture     Status: None   Collection Time: 08/15/15  2:53 PM  Result Value Ref Range Status   Specimen Description WOUND  Final   Special Requests NONE  Final   Gram Stain FEW WBC SEEN NO ORGANISMS SEEN   Final   Culture NO GROWTH 3 DAYS  Final   Report Status 08/18/2015 FINAL  Final  C difficile quick scan w PCR reflex     Status: None   Collection Time: 08/17/15 11:34 AM  Result Value Ref Range Status   C Diff antigen NEGATIVE NEGATIVE Final   C Diff toxin NEGATIVE NEGATIVE Final   C Diff interpretation Negative for C. difficile  Final  Stool culture     Status: None   Collection Time: 09/03/15  3:51 PM  Result Value Ref Range Status   Specimen Description STOOL  Final   Special Requests Immunocompromised  Final   Culture   Final    NO SALMONELLA OR SHIGELLA ISOLATED No Pathogenic E. coli detected NO CAMPYLOBACTER DETECTED    Report Status 09/07/2015 FINAL  Final  C difficile quick scan w PCR reflex     Status: None   Collection Time: 09/13/15 12:51 AM  Result Value Ref Range Status   C Diff antigen  NEGATIVE NEGATIVE Final   C Diff toxin NEGATIVE NEGATIVE Final   C Diff interpretation Negative for C. difficile  Final  Culture, blood (routine x 2)     Status: None   Collection Time: 09/16/15 11:24 AM  Result Value Ref Range Status   Specimen Description BLOOD LEFT HAND  Final   Special Requests BOTTLES DRAWN AEROBIC AND ANAEROBIC  1CC  Final   Culture NO GROWTH 5 DAYS  Final   Report Status 09/21/2015 FINAL  Final  Culture, blood (routine x 2)     Status: None   Collection Time: 09/16/15 12:23 PM  Result Value Ref Range Status   Specimen Description BLOOD RIGHT HAND  Final   Special Requests BOTTLES DRAWN AEROBIC AND ANAEROBIC  1CC  Final   Culture  Setup Time   Final    GRAM POSITIVE COCCI AEROBIC BOTTLE ONLY CRITICAL RESULT CALLED TO, READ BACK BY AND VERIFIED WITH: TESS THOMAS,RN 09/17/2015 0631 BY JRS.    Culture   Final    ENTEROCOCCUS FAECALIS AEROBIC BOTTLE ONLY VRE HAVE INTRINSIC RESISTANCE TO MOST COMMONLY USED ANTIBIOTICS AND THE ABILITY TO ACQUIRE RESISTANCE TO MOST AVAILABLE ANTIBIOTICS. CRITICAL RESULT CALLED TO, READ BACK BY AND VERIFIED WITH: CHERYL SMITH AT 1610 09/19/15 DV    Report Status 09/21/2015 FINAL  Final   Organism ID, Bacteria ENTEROCOCCUS FAECALIS  Final      Susceptibility   Enterococcus faecalis - MIC*    AMPICILLIN <=2 SENSITIVE Sensitive     LINEZOLID 2 SENSITIVE Sensitive     CIPROFLOXACIN Value in next row Resistant      RESISTANT>=8    TETRACYCLINE Value in next row Resistant      RESISTANT>=16    VANCOMYCIN Value in next row Resistant      RESISTANT>=32    GENTAMICIN SYNERGY Value in next row Resistant      RESISTANT>=32    * ENTEROCOCCUS FAECALIS  Culture, respiratory (NON-Expectorated)     Status: None   Collection Time: 09/16/15  3:50 PM  Result Value Ref Range Status   Specimen Description TRACHEAL ASPIRATE  Final   Special Requests Immunocompromised  Final   Gram Stain   Final    FEW WBC SEEN GOOD SPECIMEN - 80-90%  WBCS RARE GRAM NEGATIVE RODS    Culture LIGHT GROWTH PSEUDOMONAS AERUGINOSA  Final   Report Status 09/19/2015 FINAL  Final   Organism ID, Bacteria PSEUDOMONAS AERUGINOSA  Final      Susceptibility   Pseudomonas aeruginosa - MIC*    CEFTAZIDIME 8  SENSITIVE Sensitive     CIPROFLOXACIN 2 INTERMEDIATE Intermediate     GENTAMICIN >=16 RESISTANT Resistant     IMIPENEM >=16 RESISTANT Resistant     PIP/TAZO Value in next row Sensitive      SENSITIVE32    CEFEPIME Value in next row Sensitive      SENSITIVE8    LEVOFLOXACIN Value in next row Resistant      RESISTANT>=8    * LIGHT GROWTH PSEUDOMONAS AERUGINOSA  Culture, expectorated sputum-assessment     Status: None   Collection Time: 09/22/15  2:09 PM  Result Value Ref Range Status   Specimen Description ENDOTRACHEAL  Final   Special Requests Normal  Final   Sputum evaluation THIS SPECIMEN IS ACCEPTABLE FOR SPUTUM CULTURE  Final   Report Status 09/24/2015 FINAL  Final  Culture, respiratory (NON-Expectorated)     Status: None   Collection Time: 09/22/15  2:09 PM  Result Value Ref Range Status   Specimen Description ENDOTRACHEAL  Final   Special Requests Normal Reflexed from [id number]  Final   Gram Stain   Final    FEW WBC SEEN FEW GRAM NEGATIVE RODS POOR SPECIMEN - LESS THAN 70% WBCS    Culture   Final    MODERATE GROWTH PSEUDOMONAS AERUGINOSA LIGHT GROWTH KLEBSIELLA PNEUMONIAE REFER TO SENSITIVITIES FROM PREVIOUS CULTURE FOR ORG 2    Report Status 10/01/2015 FINAL  Final   Organism ID, Bacteria PSEUDOMONAS AERUGINOSA  Final   Organism ID, Bacteria KLEBSIELLA PNEUMONIAE  Final      Susceptibility   Pseudomonas aeruginosa - MIC*    CEFTAZIDIME 4 SENSITIVE Sensitive     CIPROFLOXACIN 2 INTERMEDIATE Intermediate     GENTAMICIN >=16 RESISTANT Resistant     IMIPENEM >=16 RESISTANT Resistant     PIP/TAZO Value in next row Sensitive      SENSITIVE16    * MODERATE GROWTH PSEUDOMONAS AERUGINOSA  Tissue culture     Status: None    Collection Time: 09/24/15  6:44 AM  Result Value Ref Range Status   Specimen Description BONE  Final   Special Requests Normal  Final   Gram Stain MODERATE WBC SEEN FEW GRAM NEGATIVE RODS   Final   Culture   Final    MODERATE GROWTH ESCHERICHIA COLI MODERATE GROWTH KLEBSIELLA PNEUMONIAE ESBL-EXTENDED SPECTRUM BETA LACTAMASE-THE ORGANISM IS RESISTANT TO PENICILLINS, CEPHALOSPORINS AND AZTREONAM ACCORDING TO CLSI M100-S15 VOL.25 N01 JAN 2005. ORGANISM 1 This organism isolate is resistant to one or more antiotic agents in three or more antimicrobial categories.  Suggest Infectious Disease consult.   ORGANISM 2 CRITICAL RESULT CALLED TO, READ BACK BY AND VERIFIED WITH: RN Mardene Celeste LINDSAY 09/27/15 1005AM    Report Status 09/28/2015 FINAL  Final   Organism ID, Bacteria ESCHERICHIA COLI  Final   Organism ID, Bacteria KLEBSIELLA PNEUMONIAE  Final      Susceptibility   Escherichia coli - MIC*    AMPICILLIN >=32 RESISTANT Resistant     CEFTAZIDIME 4 RESISTANT Resistant     CEFAZOLIN >=64 RESISTANT Resistant     CEFTRIAXONE 8 RESISTANT Resistant     GENTAMICIN <=1 SENSITIVE Sensitive     IMIPENEM 8 RESISTANT Resistant     TRIMETH/SULFA <=20 SENSITIVE Sensitive     Extended ESBL POSITIVE Resistant     PIP/TAZO Value in next row Resistant      RESISTANT>=128    * MODERATE GROWTH ESCHERICHIA COLI   Klebsiella pneumoniae - MIC*    AMPICILLIN Value in next row Resistant  RESISTANT>=128    CEFTAZIDIME Value in next row Resistant      RESISTANT>=128    CEFAZOLIN Value in next row Resistant      RESISTANT>=128    CEFTRIAXONE Value in next row Resistant      RESISTANT>=128    CIPROFLOXACIN Value in next row Resistant      RESISTANT>=128    GENTAMICIN Value in next row Resistant      RESISTANT>=128    IMIPENEM Value in next row Resistant      RESISTANT>=128    TRIMETH/SULFA Value in next row Sensitive      RESISTANT>=128    PIP/TAZO Value in next row Resistant      RESISTANT>=128    *  MODERATE GROWTH KLEBSIELLA PNEUMONIAE  Anaerobic culture     Status: None   Collection Time: 09/24/15  3:55 PM  Result Value Ref Range Status   Specimen Description BONE  Final   Special Requests Normal  Final   Culture NO ANAEROBES ISOLATED  Final   Report Status 09/29/2015 FINAL  Final  Culture, fungus without smear     Status: None   Collection Time: 09/24/15  3:55 PM  Result Value Ref Range Status   Specimen Description BONE  Final   Special Requests Normal  Final   Culture NO FUNGUS ISOLATED AFTER 24 DAYS  Final   Report Status 10/18/2015 FINAL  Final  C difficile quick scan w PCR reflex     Status: None   Collection Time: 10/07/15  2:02 PM  Result Value Ref Range Status   C Diff antigen NEGATIVE NEGATIVE Final   C Diff toxin NEGATIVE NEGATIVE Final   C Diff interpretation Negative for C. difficile  Final  Culture, blood (routine x 2)     Status: None   Collection Time: 10/16/15  9:52 AM  Result Value Ref Range Status   Specimen Description BLOOD LEFT HAND  Final   Special Requests   Final    BOTTLES DRAWN AEROBIC AND ANAEROBIC  AER 6CC ANA 4CC   Culture NO GROWTH 6 DAYS  Final   Report Status 10/22/2015 FINAL  Final  Culture, blood (routine x 2)     Status: None   Collection Time: 10/16/15 12:25 PM  Result Value Ref Range Status   Specimen Description BLOOD LEFT HAND  Final   Special Requests BOTTLES DRAWN AEROBIC AND ANAEROBIC  5CC  Final   Culture NO GROWTH 6 DAYS  Final   Report Status 10/22/2015 FINAL  Final  Culture, expectorated sputum-assessment     Status: None   Collection Time: 10/18/15  1:15 PM  Result Value Ref Range Status   Specimen Description SPUTUM  Final   Special Requests NONE  Final   Sputum evaluation THIS SPECIMEN IS ACCEPTABLE FOR SPUTUM CULTURE  Final   Report Status 10/18/2015 FINAL  Final  Culture, respiratory (NON-Expectorated)     Status: None   Collection Time: 10/18/15  1:15 PM  Result Value Ref Range Status   Specimen Description  SPUTUM  Final   Special Requests NONE Reflexed from [id number]  Final   Gram Stain   Final    GOOD SPECIMEN - 80-90% WBCS MODERATE WBC SEEN MANY GRAM NEGATIVE RODS    Culture   Final    HEAVY GROWTH PSEUDOMONAS AERUGINOSA MODERATE GROWTH KLEBSIELLA PNEUMONIAE CRITICAL RESULT CALLED TO, READ BACK BY AND VERIFIED WITHRod Mae: JOANNA LINDSAY AT [id number] 10/21/15 DV KLEBSIELLA PNEUMONIAE This organism isolate is resistant to one or more  antiotic agents in three or more antimicrobial categories.  Suggest Infectious Disease  consult.      Report Status 10/22/2015 FINAL  Final   Organism ID, Bacteria PSEUDOMONAS AERUGINOSA  Final   Organism ID, Bacteria KLEBSIELLA PNEUMONIAE  Final      Susceptibility   Klebsiella pneumoniae - MIC*    AMPICILLIN >=32 RESISTANT Resistant     CEFAZOLIN >=64 RESISTANT Resistant     CEFTRIAXONE >=64 RESISTANT Resistant     CIPROFLOXACIN >=4 RESISTANT Resistant     GENTAMICIN >=16 RESISTANT Resistant     IMIPENEM >=16 RESISTANT Resistant     NITROFURANTOIN >=512 RESISTANT Resistant     TRIMETH/SULFA <=20 SENSITIVE Sensitive     * MODERATE GROWTH KLEBSIELLA PNEUMONIAE   Pseudomonas aeruginosa - MIC*    CEFTAZIDIME 4 SENSITIVE Sensitive     CIPROFLOXACIN >=4 RESISTANT Resistant     GENTAMICIN 8 INTERMEDIATE Intermediate     IMIPENEM >=16 RESISTANT Resistant     PIP/TAZO Value in next row Sensitive      SENSITIVE32    * HEAVY GROWTH PSEUDOMONAS AERUGINOSA  C difficile quick scan w PCR reflex     Status: None   Collection Time: 10/18/15  4:39 PM  Result Value Ref Range Status   C Diff antigen NEGATIVE NEGATIVE Final   C Diff toxin NEGATIVE NEGATIVE Final   C Diff interpretation Negative for C. difficile  Final  Culture, blood (Routine X 2) w Reflex to ID Panel     Status: None   Collection Time: 11/06/15  8:27 AM  Result Value Ref Range Status   Specimen Description BLOOD RIGHT HAND  Final   Special Requests BOTTLES DRAWN AEROBIC AND ANAEROBIC 3CC  Final   Culture NO  GROWTH 5 DAYS  Final   Report Status 11/11/2015 FINAL  Final  Culture, blood (Routine X 2) w Reflex to ID Panel     Status: None   Collection Time: 11/06/15  8:29 AM  Result Value Ref Range Status   Specimen Description BLOOD LEFT HAND  Final   Special Requests BOTTLES DRAWN AEROBIC AND ANAEROBIC  1CC  Final   Culture NO GROWTH 5 DAYS  Final   Report Status 11/11/2015 FINAL  Final  Culture, expectorated sputum-assessment     Status: None   Collection Time: 11/06/15  8:41 AM  Result Value Ref Range Status   Specimen Description EXPECTORATED SPUTUM  Final   Special Requests Immunocompromised  Final   Sputum evaluation THIS SPECIMEN IS ACCEPTABLE FOR SPUTUM CULTURE  Final   Report Status 11/06/2015 FINAL  Final  Culture, respiratory (NON-Expectorated)     Status: None   Collection Time: 11/06/15  8:41 AM  Result Value Ref Range Status   Specimen Description EXPECTORATED SPUTUM  Final   Special Requests Immunocompromised Reflexed from [id number]  Final   Gram Stain   Final    MANY WBC SEEN MANY GRAM NEGATIVE RODS EXCELLENT SPECIMEN - 90-100% WBCS    Culture   Final    MODERATE GROWTH KLEBSIELLA PNEUMONIAE MODERATE GROWTH PSEUDOMONAS AERUGINOSA CRITICAL RESULT CALLED TO, READ BACK BY AND VERIFIED WITH: DR. FITZGERALD AT 1120 11/09/15 DV DR. FITZGERALD REQUESTED ORGANISM BE SENT OUT FOR TIGECYCLINE CEFT/AVIBACTAM AND COLISTIN Sent to [facility] for further susceptibility testing. 11/11/15 CTJ    Report Status 11/15/2015 FINAL  Final   Organism ID, Bacteria KLEBSIELLA PNEUMONIAE  Final   Organism ID, Bacteria PSEUDOMONAS AERUGINOSA  Final      Susceptibility   Klebsiella pneumoniae -  MIC*    AMPICILLIN >=32 RESISTANT Resistant     CEFAZOLIN >=64 RESISTANT Resistant     CEFTRIAXONE 32 RESISTANT Resistant     CIPROFLOXACIN >=4 RESISTANT Resistant     GENTAMICIN >=16 RESISTANT Resistant     IMIPENEM >=16 RESISTANT Resistant     NITROFURANTOIN 256 RESISTANT Resistant     TRIMETH/SULFA  >=320 RESISTANT Resistant     Extended ESBL POSITIVE Resistant     * MODERATE GROWTH KLEBSIELLA PNEUMONIAE   Pseudomonas aeruginosa - MIC*    CEFTAZIDIME >=64 RESISTANT Resistant     CIPROFLOXACIN 2 INTERMEDIATE Intermediate     GENTAMICIN >=16 RESISTANT Resistant     IMIPENEM >=16 RESISTANT Resistant     * MODERATE GROWTH PSEUDOMONAS AERUGINOSA  Culture, respiratory (NON-Expectorated)     Status: None   Collection Time: 11/14/15  2:07 PM  Result Value Ref Range Status   Specimen Description TRACHEAL ASPIRATE  Final   Special Requests NONE  Final   Gram Stain   Final    EXCELLENT SPECIMEN - 90-100% WBCS MANY WBC SEEN MODERATE GRAM NEGATIVE RODS    Culture   Final    HEAVY GROWTH PSEUDOMONAS AERUGINOSA CRITICAL VALUE NOTED.  VALUE IS CONSISTENT WITH PREVIOUSLY REPORTED AND CALLED VALUE.    Report Status 11/16/2015 FINAL  Final   Organism ID, Bacteria PSEUDOMONAS AERUGINOSA  Final      Susceptibility   Pseudomonas aeruginosa - MIC*    CEFTAZIDIME >=64 RESISTANT Resistant     CIPROFLOXACIN 2 INTERMEDIATE Intermediate     GENTAMICIN >=16 RESISTANT Resistant     IMIPENEM >=16 RESISTANT Resistant     PIP/TAZO Value in next row Resistant      RESISTANT>=128    TOBRAMYCIN Value in next row Sensitive      SENSITIVE2    * HEAVY GROWTH PSEUDOMONAS AERUGINOSA    Coagulation Studies: No results for input(s): LABPROT, INR in the last 72 hours.  Urinalysis: No results for input(s): COLORURINE, LABSPEC, PHURINE, GLUCOSEU, HGBUR, BILIRUBINUR, KETONESUR, PROTEINUR, UROBILINOGEN, NITRITE, LEUKOCYTESUR in the last 72 hours.  Invalid input(s): APPERANCEUR    Imaging: No results found.   Medications:   . dextrose 50 mL/hr at 11/18/15 0600  . feeding supplement (JEVITY 1.5 CAL/FIBER) 1,000 mL (11/15/15 1154)  . norepinephrine (LEVOPHED) Adult infusion     . albumin human  12.5 g Intravenous Once  . amiodarone  200 mg Per Tube BID   Followed by  . [START ON 11/25/2015] amiodarone   200 mg Per Tube Daily  . antiseptic oral rinse  7 mL Mouth Rinse QID  . aspirin  81 mg Oral Daily  . chlorhexidine gluconate  15 mL Mouth Rinse BID  . colistimethate (COLISTIN) IV  200 mg Intravenous Q48H  . epoetin (EPOGEN/PROCRIT) injection  10,000 Units Intravenous Q M,W,F-HD  . escitalopram  10 mg Oral Daily  . feeding supplement (PRO-STAT SUGAR FREE 64)  30 mL Oral 6 X Daily  . free water  20 mL Per Tube 6 times per day  . heparin subcutaneous  5,000 Units Subcutaneous Q12H  . hydrocortisone  10 mg Per Tube BID  . lidocaine  1 patch Transdermal Q24H  . midodrine  10 mg Per Tube TID WC  . mirtazapine  7.5 mg Oral QHS  . multivitamin  5 mL Oral Daily  . pantoprazole sodium  40 mg Per Tube Daily  . saccharomyces boulardii  250 mg Oral BID  . sodium chloride  10-40 mL Intracatheter Q12H  .  sodium hypochlorite   Irrigation BID  . tigecycline (TYGACIL) IVPB  50 mg Intravenous Q12H   acetaminophen (TYLENOL) oral liquid 160 mg/5 mL, HYDROmorphone (DILAUDID) injection, ipratropium-albuterol, loperamide, morphine injection, ondansetron (ZOFRAN) IV, oxyCODONE, polyvinyl alcohol, sodium chloride, zinc oxide  Assessment/ Plan:  49 y.o. 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 Admitted on 08/09/2015  Echo: 11/07/19: EF of 30% to 35%.   1. End-stage renal disease on hemodialysis on HD MWF. The patient has been on dialysis  since October of 2014. R IJ permcath.  - Dialysis treatment with IV albumin for oncotic support.  - Midodrine and hydrocortisone ordered for blood pressure support.  - Seen and examined on hemodialysis. Tolerating treatment well.  - Continue MWF schedule, monitor daily for dailysis treatment.   2. Anemia of CKD.  - Epogen and periodic blood transfusions.   3. Acute resp failure: ventilator dependent -prolonged course on ventilator, fio2 currently 40% with PEEP of 5.   4. Sepsis: VRE in blood. Bone cultures E. Coli and klebsiella. Elevated wbc, pending wbc.  - Currently on colistimethate and  Tigecycline.  5. Secondary Hyperparathyroidism: PTH 50, phos 4.1 - not currently on a binder or vitamin D agent.     LOS: 98 Damyra Luscher 12/30/20169:37 AM

## 2015-11-18 NOTE — Progress Notes (Signed)
Nutrition Follow-up  DOCUMENTATION CODES:   Severe malnutrition in context of acute illness/injury  INTERVENTION:   EN: TF being adjusted by MD; initially this AM, plan was to start TF at 10 ml/hr and titrate by 10 mL q 24 hours as tolerated to rate of 50 ml/hr but TF now on hold due to vomiting post reinitation of TF at 10 ml/hr this AM. Pt appears to be unable to tolerate even trophic feedings of TF at this time. If aggressive intervention is wanted,  recommend abdominal xray for further evaluation (confirm tube placement, assess for ileus/obstruction, etc). If unable to tolerate EN, only other option would be TPN which husband has previously reported he does not want. Will continue to follow but no other recommendations at this time  NUTRITION DIAGNOSIS:   Inadequate oral intake related to wound healing, acute illness, chronic illness as evidenced by NPO status, estimated needs.  GOAL:   Patient will meet greater than or equal to 90% of their needs  MONITOR:    (Energy Intake, Anthropometrics, Digestive System, Electrolyte/Renal Profile, Glucose Profile)  REASON FOR ASSESSMENT:   Consult Enteral/tube feeding initiation and management  ASSESSMENT:   TF restarted at 10 ml/hr this AM, pt found later with emesis on gown and TF stopped. TF has been off most of the week due to episodes of vomiting/gagging. D5 had to be started while TF on hold due to hypoglycemia, changed to D10 due to continued hypoglycemic episodes. Pt continues with HD as tolerated, vent via trach  EN: TF on hold  Skin:   (stage IV sacrum, stage III hip, unstageable foot; rectal ulcer from fleixseal, ulcer in nose from dobhoff)  Digestive System: last BM yesterday, N/V at times  Electrolyte and Renal Profile:  Recent Labs Lab 11/14/15 0855 11/18/15 0830  BUN 87* 58*  CREATININE 1.71* 1.29*  NA 141 139  K 4.8 3.7  MG  --  1.7  PHOS 4.7* 4.1   Glucose Profile:  Recent Labs  11/17/15 2352  11/18/15 0743 11/18/15 1146  GLUCAP 93 103* 106*   Nutritional Anemia Profile:  CBC Latest Ref Rng 11/16/2015 11/14/2015 11/14/2015  WBC 3.6 - 11.0 K/uL 34.3(H) 19.1(H) 19.9(H)  Hemoglobin 12.0 - 16.0 g/dL 11.0(L) 7.2(L) 7.3(L)  Hematocrit 35.0 - 47.0 % 38.1 24.1(L) 24.3(L)  Platelets 150 - 440 K/uL 199 242 238    Meds: D10 at 50 ml/hr  Height:   Ht Readings from Last 1 Encounters:  01/25/12 5\' 9"  (1.753 m)    Weight:   Wt Readings from Last 1 Encounters:  11/18/15 227 lb 1.2 oz (103 kg)    Filed Weights   11/17/15 1400 11/18/15 0459  Weight: 227 lb 1.2 oz (103 kg) 227 lb 1.2 oz (103 kg)    BMI:  Body mass index is 33.52 kg/(m^2).  Estimated Nutritional Needs:   Kcal:  TEE 1851 kcals/d (Ve 7.4, Tmax 37.4) Using current wt (97kg) edema noted (re-intubated)  Protein:  (1.5-2.0 g/kg)145-194 g/d but likely closer to 2.0-2.5 g/kg Using current wt of 97kg  Fluid:  1000ml + UOP  EDUCATION NEEDS:   No education needs identified at this time   LOW Care Level  Romelle Starcherate Abbigaile Rockman MS, RD, LDN [phone] Pager  [phone] Weekend/On-Call Pager

## 2015-11-18 NOTE — Progress Notes (Signed)
   11/18/15 1800  Clinical Encounter Type  Visited With Patient;Health care provider  Visit Type Code  Consult/Referral To Chaplain  [facility].  Pt was slowly responsive when I arrived.  No family present at the time, but nursing staff were on the phone with pt's husband during that time.  Pt appeared to be more stable when I left.  Asbury Automotive GroupChaplain Kei Mcelhiney-pager [phone]

## 2015-11-18 NOTE — Progress Notes (Signed)
ID E note Reviewed notes, labs, imaging.  She has been on colistin and tigecycline I spoke with the lab sensitivity testing is still pending for ceftazidime -avibactam, tigecycline and colisitin  I spoke to Dr Belia HemanKasa Since the most recent cx is only growing pseudomonas will start inhaled tobra to target this (the kleb pna was resistant to tobramycin) while continuing colistin and tigecycline pending further sensitivity testing

## 2015-11-18 NOTE — Progress Notes (Addendum)
eLink Physician-Brief Progress Note Patient Name: Lisette GrinderHedda McMillian Monroe DOB: [birth year] MRN: [id number]   Date of Service  11/18/2015  HPI/Events of Note  Long run on NSVT which broke spontaneously. Now in NSR (HR = 78) with multifocal PVC's. Patient is already on Amiodarone PO for AFIB.  eICU Interventions  Will order: 1. BMP and Mg++ level now.  2. Bolus with Amiodarone 150 mg IV now.  3. Cycle Troponins. 4. 12 Lead EKG now.      Intervention Category Major Interventions: Arrhythmia - evaluation and management  Nitasha Jewel Eugene 11/18/2015, 6:08 PM

## 2015-11-18 NOTE — Progress Notes (Addendum)
eLink Physician-Brief Progress Note Patient Name: Courtney GrinderHedda McMillian [name] DOB: 28-Sep-1966 MRN: [id number]   Date of Service  11/18/2015  HPI/Events of Note  Multiple issues: 1. Troponin #1 = 0.06. EKG -  Normal sinus rhythm. Cannot rule out Inferior infarct (cited on or before 09-Nov-2015). ST & T wave abnormality, consider anterolateral ischemia.  Prolonged QT.  Demand ischemia? Already on ASA2. K+ = 3.2, Mg++ = 1.6 and Creatinine = 0.98  eICU Interventions  Will order: 1. Replete K+ and Mg++. 2. Metoprolol 12.5 mg PO now and BID.  3. Trend troponin. Will defer on Heparin IV infusion for now.     Intervention Category Intermediate Interventions: Diagnostic test evaluation Minor Interventions: Routine modifications to care plan (e.g. PRN medications for pain, fever)  Courtney [name] 11/18/2015, 7:20 PM

## 2015-11-18 NOTE — ED Provider Notes (Signed)
[facility] Department of Emergency Medicine   Code Blue CONSULT NOTE  Chief Complaint: Cardiac arrest/unresponsive   Level V Caveat: Unresponsive  History of present illness: I was contacted by the hospital for a CODE BLUE cardiac arrest upstairs and presented to the patient's bedside.   The patient had been suctioned, and had sudden onset of ventricular tachycardia. A code was called and she was "unresponsive".  ROS: Unable to obtain, Level V caveat  Scheduled Meds: . amiodarone  150 mg Intravenous Once  . amiodarone  200 mg Per Tube BID   Followed by  . [START ON 11/25/2015] amiodarone  200 mg Per Tube Daily  . antiseptic oral rinse  7 mL Mouth Rinse QID  . aspirin  81 mg Oral Daily  . chlorhexidine gluconate  15 mL Mouth Rinse BID  . colistimethate (COLISTIN) IV  250 mg Intravenous Q48H  . epoetin (EPOGEN/PROCRIT) injection  10,000 Units Intravenous Q M,W,F-HD  . escitalopram  10 mg Oral Daily  . feeding supplement (PRO-STAT SUGAR FREE 64)  30 mL Oral 6 X Daily  . free water  20 mL Per Tube 6 times per day  . heparin subcutaneous  5,000 Units Subcutaneous Q12H  . hydrocortisone  10 mg Per Tube BID  . lidocaine  1 patch Transdermal Q24H  . midodrine  10 mg Per Tube TID WC  . mirtazapine  7.5 mg Oral QHS  . multivitamin  5 mL Oral Daily  . pantoprazole sodium  40 mg Per Tube Daily  . saccharomyces boulardii  250 mg Oral BID  . sodium chloride  10-40 mL Intracatheter Q12H  . sodium hypochlorite   Irrigation BID  . tigecycline (TYGACIL) IVPB  50 mg Intravenous Q12H  . tobramycin (PF)  300 mg Nebulization BID   Continuous Infusions: . dextrose 50 mL/hr at 11/18/15 0700  . feeding supplement (JEVITY 1.5 CAL/FIBER)    . norepinephrine (LEVOPHED) Adult infusion     PRN Meds:.acetaminophen (TYLENOL) oral liquid 160 mg/5 mL, HYDROmorphone (DILAUDID) injection, ipratropium-albuterol, loperamide, morphine injection, ondansetron (ZOFRAN) IV, oxyCODONE,  polyvinyl alcohol, sodium chloride, zinc oxide Past Medical History  Diagnosis Date  . Obesity   . Dyslipidemia   . Hypertension   . Coronary artery disease     s/p BMS 2010 LAD  . Dysrhythmia     ventricular tachycardia resolved after LAD stent and beta blocker  . Diabetes mellitus     with retinopathy, neuropathy and microalbuminemia  . ESRD (end stage renal disease) on dialysis    Past Surgical History  Procedure Laterality Date  . Cholecystectomy  1990  . Pci lad  12/2010  . Colonoscopy  08/2011  . Left heart catheterization with coronary angiogram N/A 01/28/2012    Procedure: LEFT HEART CATHETERIZATION WITH CORONARY ANGIOGRAM;  Surgeon: Lesleigh Noe, MD;  Location: [facility] CATH LAB;  Service: Cardiovascular;  Laterality: N/A;  . Tracheostomy    . Gastric bypass     Social History   Social History  . Marital Status: Married    Spouse Name: N/A  . Number of Children: N/A  . Years of Education: N/A   Occupational History  . Not on file.   Social History Main Topics  . Smoking status: Never Smoker   . Smokeless tobacco: Never Used  . Alcohol Use: No     Comment: occassional  . Drug Use: No  . Sexual Activity: Yes   Other Topics Concern  . Not on file   Social  History Narrative   Allergies  Allergen Reactions  . Contrast Media [Iodinated Diagnostic Agents] Anaphylaxis  . Ampicillin Rash    Last set of Vital Signs (not current) Filed Vitals:   11/18/15 1200 11/18/15 1300  BP: 84/51 125/76  Pulse: 159   Temp: 98.2 F (36.8 C)   Resp: 23 21      Physical Exam Gen: unresponsive Cardiovascular: Strong carotid pulse approximately 80 bpm  Resp: apneic. Breath sounds equal bilaterally with end-tidal CO2 normal, with good end-tidal waveform at approximately 40. Patient is on ventilator tracheostomy in place. Abd: Obese and nondistended Neuro: GCS 3, unresponsive to pain  Neck: No crepitus  Musculoskeletal: No deformity  Skin: warm  Procedures     CRITICAL CARE Performed by: Sharyn CreamerQUALE, Mahin Guardia Total critical care time: 10 Critical care time was exclusive of separately billable procedures and treating other patients. Critical care was necessary to treat or prevent imminent or life-threatening deterioration. Critical care was time spent personally by me on the following activities: development of treatment plan with patient and/or surrogate as well as nursing, discussions with consultants, evaluation of patient's response to treatment, examination of patient, obtaining history from patient or surrogate, ordering and performing treatments and interventions, ordering and review of laboratory studies, ordering and review of radiographic studies, pulse oximetry and re-evaluation of patient's condition.  Cardiopulmonary Resuscitation (CPR) Procedure Note  Medical Decision making  Arrived to the bedside. Patient had a spontaneous conversion from V. tach which I reviewed on telemetry back to normal sinus rhythm and had a pulse present. Dr. Dellie CatholicSommers of the eICU discussed with me, reviewed exam, pulse, ventilator and respiratory exam, and he assumed care of the patient. Appears the patient likely had a brief episode of V. tach with spontaneous conversion back to normal sinus rhythm. Ongoing care and disposition per the ICU physician who assumed care. Direct verbal communication with me.  Assessment and Plan  Ventricular tachycardia, resolved    Sharyn CreamerMark Tobiah Celestine, MD 11/18/15 [phone]

## 2015-11-18 NOTE — Progress Notes (Signed)
Resumed tube feeding this morning at 10 ml/hr. Pt found with emesis on gown. Tube feeding was immediately suspended, in-line suction was performed with moderate yellow, thick secretion return. Bilateral rhonchi to auscultation. Dr Belia HemanKasa was informed. Pt appears to be more comfortable after suctioning. Will continue to monitor.

## 2015-11-18 NOTE — Progress Notes (Signed)
PT Cancellation Note  Patient Details Name: Courtney [name] MRN: [name] DOB: 24-May-1966   Cancelled Treatment:    Reason Eval/Treat Not Completed: Patient at procedure or test/unavailable (Treatment attempted; patient currently receiving dialysis and unavailable for participation.  Will re-attempt at later time/date as patient available and medically appropriate.)   Guillermo Nehring H. Manson PasseyBrown, PT, DPT, NCS 11/18/2015, 11:40 AM [phone]

## 2015-11-19 LAB — CBC WITH DIFFERENTIAL/PLATELET
BASOS PCT: 1 %
Basophils Absolute: 0.1 10*3/uL (ref 0–0.1)
EOS ABS: 0.1 10*3/uL (ref 0–0.7)
EOS PCT: 1 %
HCT: 24.6 % — ABNORMAL LOW (ref 35.0–47.0)
HEMOGLOBIN: 7.5 g/dL — AB (ref 12.0–16.0)
Lymphocytes Relative: 7 %
Lymphs Abs: 1.5 10*3/uL (ref 1.0–3.6)
MCH: 28.6 pg (ref 26.0–34.0)
MCHC: 30.3 g/dL — ABNORMAL LOW (ref 32.0–36.0)
MCV: 94.5 fL (ref 80.0–100.0)
Monocytes Absolute: 1.6 10*3/uL — ABNORMAL HIGH (ref 0.2–0.9)
Monocytes Relative: 8 %
NEUTROS PCT: 83 %
Neutro Abs: 17.2 10*3/uL — ABNORMAL HIGH (ref 1.4–6.5)
PLATELETS: 229 10*3/uL (ref 150–440)
RBC: 2.61 MIL/uL — AB (ref 3.80–5.20)
RDW: 19.4 % — ABNORMAL HIGH (ref 11.5–14.5)
WBC: 20.5 10*3/uL — AB (ref 3.6–11.0)

## 2015-11-19 LAB — BASIC METABOLIC PANEL WITH GFR
Anion gap: 9 (ref 5–15)
BUN: 45 mg/dL — ABNORMAL HIGH (ref 6–20)
CO2: 27 mmol/L (ref 22–32)
Calcium: 8.1 mg/dL — ABNORMAL LOW (ref 8.9–10.3)
Chloride: 100 mmol/L — ABNORMAL LOW (ref 101–111)
Creatinine, Ser: 1.06 mg/dL — ABNORMAL HIGH (ref 0.44–1.00)
GFR calc Af Amer: >60 mL/min (ref >60)
GFR calc non Af Amer: >60 mL/min (ref >60)
Glucose, Bld: 83 mg/dL (ref 65–99)
Potassium: 3.5 mmol/L (ref 3.5–5.1)
Sodium: 136 mmol/L (ref 135–145)

## 2015-11-19 LAB — GLUCOSE, CAPILLARY
GLUCOSE-CAPILLARY: 80 mg/dL (ref 65–99)
GLUCOSE-CAPILLARY: 88 mg/dL (ref 65–99)
Glucose-Capillary: 79 mg/dL (ref 65–99)

## 2015-11-19 LAB — TROPONIN I
TROPONIN I: 0.05 ng/mL — AB (ref <0.031)
TROPONIN I: 0.13 ng/mL — AB (ref <0.031)

## 2015-11-19 LAB — MAGNESIUM: MAGNESIUM: 2.1 mg/dL (ref 1.7–2.4)

## 2015-11-19 MED ORDER — MAGNESIUM SULFATE 2 GM/50ML IV SOLN
2.0000 g | Freq: Once | INTRAVENOUS | Status: AC
  Administered 2015-11-19: 2 g via INTRAVENOUS
  Filled 2015-11-19: qty 50

## 2015-11-19 MED ORDER — POTASSIUM CHLORIDE 10 MEQ/50ML IV SOLN
10.0000 meq | INTRAVENOUS | Status: AC
  Administered 2015-11-19 (×2): 10 meq via INTRAVENOUS
  Filled 2015-11-19 (×2): qty 50

## 2015-11-19 NOTE — Progress Notes (Signed)
eLink Physician-Brief Progress Note Patient Name: Courtney GrinderHedda McMillian [name] DOB: 1966/04/11 MRN: [id number]   Date of Service  11/19/2015  HPI/Events of Note  Runs of non-sustained VT.  This had occurred earlier and 2 gm of mag and 20 mEq of potassium given along with 150 mg amio.  eICU Interventions  Plan; 20 mEq KCL 2gms Mag Continue to monitor     Intervention Category Major Interventions: Arrhythmia - evaluation and management  Brigid Vandekamp 11/19/2015, 3:40 AM

## 2015-11-19 NOTE — Progress Notes (Signed)
MEDICATION RELATED CONSULT NOTE - Follow up   Pharmacy Consult for Renal dosing  Allergies  Allergen Reactions  . Contrast Media [Iodinated Diagnostic Agents] Anaphylaxis  . Ampicillin Rash   Patient Measurements: Ht 795ft 9in Height:  (SCALE BROKE) Weight: 231 lb 7.7 oz (105 kg) IBW/kg (Calculated) : 66.2  Vital Signs: Temp: 98.1 F (36.7 C) (12/31 0400) Temp Source: Axillary (12/31 0400) BP: 97/44 mmHg (12/31 0600) Pulse Rate: 54 (12/31 0600)  Recent Labs  11/16/15 1013 11/18/15 0830 11/18/15 1205 11/18/15 1810 11/19/15 0630  WBC 34.3*  --  31.3*  --  20.5*  HGB 11.0*  --  8.7*  --  7.5*  HCT 38.1  --  29.0*  --  24.6*  PLT 199  --  280  --  229  CREATININE  --  1.29*  --  0.98 1.06*  MG  --  1.7  --  1.6* 2.1  PHOS  --  4.1  --   --   --   ALBUMIN  --  1.6*  --   --   --   PROT  --  5.6*  --   --   --   AST  --  17  --   --   --   ALT  --  13*  --   --   --   ALKPHOS  --  1309*  --   --   --   BILITOT  --  0.7  --   --   --    Estimated Creatinine Clearance: 81.9 mL/min (by C-G formula based on Cr of 1.06).  Assessment: 49 yo patient on HD. Pharmacy consulted for renal dosing of medications.   Plan:  No medications require adjustment at present. Pharmacy will continue to monitor and dose adjust as needed.    Luisa HartScott Regan Mcbryar, PharmD   11/19/2015

## 2015-11-19 NOTE — Progress Notes (Signed)
ANTIBIOTIC CONSULT NOTE - follow Up  Pharmacy Consult for Colistin and Tigecycline  Indication: MDR Respiratory Infection   Allergies  Allergen Reactions  . Contrast Media [Iodinated Diagnostic Agents] Anaphylaxis  . Ampicillin Rash   Patient Measurements: Height:  (SCALE BROKE) Weight: 231 lb 7.7 oz (105 kg) IBW/kg (Calculated) : 66.2  Vital Signs: Temp: 98.1 F (36.7 C) (12/31 0400) Temp Source: Axillary (12/31 0400) BP: 97/44 mmHg (12/31 0600) Pulse Rate: 54 (12/31 0600) Intake/Output from previous day: 12/30 0701 - 12/31 0700 In: 1190 [I.V.:620; NG/GT:20; IV Piggyback:550] Out: -1000  Intake/Output from this shift:    Recent Labs  11/16/15 1013 11/18/15 0830 11/18/15 1205 11/18/15 1810 11/19/15 0630  WBC 34.3*  --  31.3*  --  20.5*  HGB 11.0*  --  8.7*  --  7.5*  PLT 199  --  280  --  229  CREATININE  --  1.29*  --  0.98 1.06*   Estimated Creatinine Clearance: 81.9 mL/min (by C-G formula based on Cr of 1.06). No results for input(s): VANCOTROUGH, VANCOPEAK, VANCORANDOM, GENTTROUGH, GENTPEAK, GENTRANDOM, TOBRATROUGH, TOBRAPEAK, TOBRARND, AMIKACINPEAK, AMIKACINTROU, AMIKACIN in the last 72 hours.   Microbiology: Recent Results (from the past 720 hour(s))  Culture, blood (Routine X 2) w Reflex to ID Panel     Status: None   Collection Time: 11/06/15  8:27 AM  Result Value Ref Range Status   Specimen Description BLOOD RIGHT HAND  Final   Special Requests BOTTLES DRAWN AEROBIC AND ANAEROBIC 3CC  Final   Culture NO GROWTH 5 DAYS  Final   Report Status 11/11/2015 FINAL  Final  Culture, blood (Routine X 2) w Reflex to ID Panel     Status: None   Collection Time: 11/06/15  8:29 AM  Result Value Ref Range Status   Specimen Description BLOOD LEFT HAND  Final   Special Requests BOTTLES DRAWN AEROBIC AND ANAEROBIC  1CC  Final   Culture NO GROWTH 5 DAYS  Final   Report Status 11/11/2015 FINAL  Final  Culture, expectorated sputum-assessment     Status: None    Collection Time: 11/06/15  8:41 AM  Result Value Ref Range Status   Specimen Description EXPECTORATED SPUTUM  Final   Special Requests Immunocompromised  Final   Sputum evaluation THIS SPECIMEN IS ACCEPTABLE FOR SPUTUM CULTURE  Final   Report Status 11/06/2015 FINAL  Final  Culture, respiratory (NON-Expectorated)     Status: None   Collection Time: 11/06/15  8:41 AM  Result Value Ref Range Status   Specimen Description EXPECTORATED SPUTUM  Final   Special Requests Immunocompromised Reflexed from [id number]  Final   Gram Stain   Final    MANY WBC SEEN MANY GRAM NEGATIVE RODS EXCELLENT SPECIMEN - 90-100% WBCS    Culture   Final    MODERATE GROWTH KLEBSIELLA PNEUMONIAE MODERATE GROWTH PSEUDOMONAS AERUGINOSA CRITICAL RESULT CALLED TO, READ BACK BY AND VERIFIED WITH: DR. FITZGERALD AT 1120 11/09/15 DV DR. FITZGERALD REQUESTED ORGANISM BE SENT OUT FOR TIGECYCLINE CEFT/AVIBACTAM AND COLISTIN Sent to [facility] for further susceptibility testing. 11/11/15 CTJ    Report Status 11/15/2015 FINAL  Final   Organism ID, Bacteria KLEBSIELLA PNEUMONIAE  Final   Organism ID, Bacteria PSEUDOMONAS AERUGINOSA  Final      Susceptibility   Klebsiella pneumoniae - MIC*    AMPICILLIN >=32 RESISTANT Resistant     CEFAZOLIN >=64 RESISTANT Resistant     CEFTRIAXONE 32 RESISTANT Resistant     CIPROFLOXACIN >=4 RESISTANT Resistant  GENTAMICIN >=16 RESISTANT Resistant     IMIPENEM >=16 RESISTANT Resistant     NITROFURANTOIN 256 RESISTANT Resistant     TRIMETH/SULFA >=320 RESISTANT Resistant     Extended ESBL POSITIVE Resistant     * MODERATE GROWTH KLEBSIELLA PNEUMONIAE   Pseudomonas aeruginosa - MIC*    CEFTAZIDIME >=64 RESISTANT Resistant     CIPROFLOXACIN 2 INTERMEDIATE Intermediate     GENTAMICIN >=16 RESISTANT Resistant     IMIPENEM >=16 RESISTANT Resistant     * MODERATE GROWTH PSEUDOMONAS AERUGINOSA  Culture, respiratory (NON-Expectorated)     Status: None   Collection Time: 11/14/15  2:07 PM   Result Value Ref Range Status   Specimen Description TRACHEAL ASPIRATE  Final   Special Requests NONE  Final   Gram Stain   Final    EXCELLENT SPECIMEN - 90-100% WBCS MANY WBC SEEN MODERATE GRAM NEGATIVE RODS    Culture   Final    HEAVY GROWTH PSEUDOMONAS AERUGINOSA CRITICAL VALUE NOTED.  VALUE IS CONSISTENT WITH PREVIOUSLY REPORTED AND CALLED VALUE.    Report Status 11/16/2015 FINAL  Final   Organism ID, Bacteria PSEUDOMONAS AERUGINOSA  Final      Susceptibility   Pseudomonas aeruginosa - MIC*    CEFTAZIDIME >=64 RESISTANT Resistant     CIPROFLOXACIN 2 INTERMEDIATE Intermediate     GENTAMICIN >=16 RESISTANT Resistant     IMIPENEM >=16 RESISTANT Resistant     PIP/TAZO Value in next row Resistant      RESISTANT>=128    TOBRAMYCIN Value in next row Sensitive      SENSITIVE2    * HEAVY GROWTH PSEUDOMONAS AERUGINOSA    Medical History: Past Medical History  Diagnosis Date  . Obesity   . Dyslipidemia   . Hypertension   . Coronary artery disease     s/p BMS 2010 LAD  . Dysrhythmia     ventricular tachycardia resolved after LAD stent and beta blocker  . Diabetes mellitus     with retinopathy, neuropathy and microalbuminemia  . ESRD (end stage renal disease) on dialysis    Assessment: 49 yo female with chronic trach, ESRD, and  profound debilitation, needing treatment for MDR Respiratory infection. Patient is on day 76 of hospital stay, and has been treated for multiple infections during this time.   11/06/15: Respiratory cultures showing Pan-resistant  Klebsiella pneumoniae and Pseudomonas aeruginosa  Sputum has been sent to [facility] for susceptibility testing per Dr. Blane Ohara request.   Patient has increased respiratory secretions, leukocytosis and low grade fever   12/30: Alk phos elevated at 1309  Plan:  Sensitivity results for Avycaz, tigecycline, and colistin still  pending.   Continue tigecycline '50mg'$  IV q12h.   Patients weight has been updated to 103kg.  Increased  colistimethate  To '250mg'$  (2.5 mg/kg) IV q48 hours.   Patient should be monitored for nephrotoxicity and neurotoxicity.  Pharmacy to continue to follow per consult.      Ulice Dash, PharmD   11/19/2015 8:31 AM

## 2015-11-19 NOTE — Progress Notes (Signed)
PULMONARY / CRITICAL CARE MEDICINE   Name: Courtney [name] MRN: [id number] DOB: 1966-05-20    ADMISSION DATE:  2015/09/05  BRIEF HISTORY: 49 AAF who has been in medical facilities (hosp, LTAC, rehab) for 2 yrs following gastric bypass surgery with multiple complications. Now with chronic trach, ESRD, profound debilitation, severe sacral pressure ulcer. Was seemingly making progress and transferred to rehab facility approx one week prior to this admission. She was sent to [facility] ED with AMS and hypotension. Working dx of severe sepsis/septic shock due to infected sacral pressure ulcer. Since admission. Her course is been very complicated with numerous complications including septic shock and GI bleeding. Now with failure to wean from vent and possible MI event, repeat ECHO EF 30-35, possible old MI, severe hypokinesis of the nnterior and anteroseptal myocardium   INTERVAL HISTORY: CODE BLUE CALLED yesterday after trach suctioning-went into Vtach Husband updated last night Now on full vent support TF's on hold, still vomiting  Summary of MAJOR EVENTS/TEST RESULTS: Admission 02/07/14-05/07/14 Admission 07/21/14-09/06/14 Discharged to [facility]. Pt had palliative consult at that time, were asked to sign off by husband.  09/23 CT head: NAD 09/23 EEG: no epileptiform activity 09/23 PRBCs for Hgb 6.4 09/24 bedside debridement of sacral wound. Abscess drained 09/25 Off vasopressors. More alert. No distress. Worsening thrombocytopenia. Vanc DC'd 09/29 Dr. Sampson Goon (I.D) excused from the case by patient's husband. 10/02 MRI -multiple infarcts 10/03 tracheal bleeding- transfused platelets 10/03 hospitalist service excused from the case by patient's husband 10/03 Echocardiogram ejection fraction was 55-60%, pulmonary systolic pressure was 39 mmHg 11/91 restart TF's at lower rate, attempt reg HD  10/12 Transferred to med-surg floor. Remains on PCCM service 10/14 SLP eval: pt unable to tolerate  PMV adequately 10/17-will re-attempt PM valve-discussed with Speech therapist 10/18 passed swallow eval-start pureed thick foods no thin liquids-continue NG feeds 10/19 transferred to step down for sepsis/aspiration pneumonia 10/19 cxr shows RLL opacity 10/21 sacral decub debride at bedside by surgery 10/22 started back on vasopressors while on HD 10/27 CT with osteo, R hip fx, unable to identify tip of dubhoff tube - sent for fluoro study 10/28 Ortho consultation: I do not feel that she is a surgical candidate. Therefore, I feel that it would best to manage this fracture nonsurgically and allow it to heal by itself over time, which it should.  10/28 Gen Surg consultation: Due to the lack of free air or free flow of contrast and the peritoneum there is no indication for any surgical intervention on this. Would recommend pulling back the feeding tube 1-2 cm. Would recheck an abdominal film to confirm no pneumoperitoneum in the morning. Absence of any changes okay to continue using Dobbhoff for feeding and medications. 10/28 gastrograffin study: The study confirms that the feeding tube pes perforated through the duodenum and the tip is within a cavity that fills with injected contrast. The cavity does appear walled off 11/4 refuses oral feeds, continue TF's CT reviewed: T8-T9 discitis/osteomyelitis, RT HIP FRACTURE 11/5 placed back on vasopressors, placed back on Vent due to resp acidosis, levophed turned off 11/6 afternoon 11/5 PM Trach changed out due to cuff leak, #6 Shiley cuffed, 11/6 dubhoff occluded, removed and replaced, new dubhoff shows tube in the antral stomach 11/15 refusing to take oral feeds 11/18 placed back on Vent for increased WOB,SOB. 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 10/18/2015; the patient developed a new left basal pneumonia;  with recurrent sepsis.  10/19/2015; fevers resolved with IV  acetaminophen. Tube feeds changed from vital to Jevity.  12/7 CXR with stable b/l opacities.  12/12 elevated K s/p HD-remains on AC mode 12/16-vomiting-hold feeds and re-assess in next 24 hrs 12/17- TF restarted at 1/2 goal (goal = 50cc/per) 12/18- elevated wbc, fever (101.4), recultured, restarted on ceftaz 12/19 Pericardial friction rub. Echocardiogram: LVEF 30-35%, anteroseptal hypokinesis or akinesis c/w anterior wall MI 12/20 Dr Sung AmabileSimonds discussed new echocardiogram findings with pt's husband and explained that this likely represents an anterior wall MI sometime in the previous couple of weeks. It was explained that she is not a candidate for any cardiology intervention. Code status was again discussed. Husband expressed understanding that she was not going to survive this illness in any favorable way but continues to request that she be full code status based on what he believes to be her wishes 12/21 vomitied 50cc of TF - AM TF held, restart TF at 3pm @30cc  12/26 Persistent intermittent trach cuff leak. Distal XLT trach requested.  12/27 15 beat NSVT 12/28 patient refused HD and refused Bath time 12/29 D10 infusion for hypoglycemia, TF's on hold due to intolerance 12/20 CODE BLUE  VTACH  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 Pneumoniae, P. Mirabilis, VRE Wound 9/26 >> No growth Blood 9/23 >> NEG CDiff 9/27>>neg Stool Cx 10/15>> negative Cdiff 10/25>>neg Trach Aspirate 10/28>> light growth pseudomonas Blood 10/28 >> 1/2 GPC >>  Sputum cultures obtained 09/22/15 due to mucus plugging>>Pseudomonas BONE TISSUE Cx  11/3>>E.coli (ESBL), k. Pneumonia (daptomycin and septra) Bld Cx 11/27>>negative thus far.  Trach Asp 11/27>> grew out Pseudomonas, and Klebsiella pneumonia. Trach Asp 12/18>>PAN RESISTANT Klebsiella, Pseudomonas Bld Cx 12/18>> NEG 12/26 resp culture >> pseudomonas  ANTIMICROBIALS:  Aztreonam 9/23 >> 9/24 Vanc 9/23 >> 9/25 Vanc 9/26>>9/27 Daptomycin 9/27>> 10/12, 10/28>> off Meropenem 9/23 >> 10/14, 10/28 >> 10/31 Zosyn 11/27,>> 11/30. Levaquin 11/29>> 11/29. Ceftaz 12/1>>12/11, 12/18>> 12/24 Colisitin 12/26 >>  Tigecycline 12/26 >>  Septra 11/8>> chronic   VITAL SIGNS: Temp:  [98 F (36.7 C)-98.2 F (36.8 C)] 98.1 F (36.7 C) (12/31 0400) Pulse Rate:  [44-159] 60 (12/31 0852) Resp:  [9-34] 19 (12/31 0600) BP: (84-135)/(44-106) 119/50 mmHg (12/31 0852) SpO2:  [57 %-100 %] 100 % (12/31 0600) FiO2 (%):  [40 %] 40 % (12/31 0846) Weight:  [231 lb 7.7 oz (105 kg)] 231 lb 7.7 oz (105 kg) (12/31 0425) HEMODYNAMICS:   VENTILATOR SETTINGS: Vent Mode:  [-] PRVC FiO2 (%):  [40 %] 40 % Set Rate:  [14 bmp] 14 bmp Vt Set:  [530 mL] 530 mL PEEP:  [5 cmH20] 5 cmH20 Pressure Support:  [12 cmH20] 12 cmH20 Plateau Pressure:  [29 cmH20-31 cmH20] 29 cmH20 INTAKE / OUTPUT:  Intake/Output Summary (Last 24 hours) at 11/19/15 0859 Last data filed at 11/19/15 0600  Gross per 24 hour  Intake   1190 ml  Output  -1000 ml  Net   2190 ml    Review of Systems  Unable to perform ROS  Unable to fully obtain, due to trach and critical illness Physical Exam  Vent dependent Somonlent HEENT WNL, NGT present Trach site clean Diffuse  rhonchi Reg with occ extrasystoles, no M Abdomen soft, + BS Ext cool, trace symmetric edema, marked muscle wasting Severe sacral decubitus ulcer-   LABS:  CBC  Recent Labs Lab 11/16/15 1013 11/18/15 1205 11/19/15 0630  WBC 34.3* 31.3* 20.5*  HGB 11.0* 8.7* 7.5*  HCT 38.1 29.0* 24.6*  PLT 199 280 229   Coag's No results for input(s): APTT, INR  in the last 168 hours. BMET  Recent Labs Lab 11/18/15 0830 11/18/15 1810 11/19/15 0630  NA 139 137 136  K 3.7 3.2* 3.5  CL 101 101 100*  CO2 BUN 58* 41* 45*  CREATININE 1.29* 0.98 1.06*  GLUCOSE 109* 105* 83   Electrolytes  Recent Labs Lab 11/14/15 0855 11/18/15 0830 11/18/15 1810 11/19/15 0630  CALCIUM 8.5* 8.2* 8.1* 8.1*  MG  --  1.7 1.6* 2.1  PHOS 4.7* 4.1  --   --    Sepsis Markers No results for input(s): LATICACIDVEN, PROCALCITON, O2SATVEN in the last 168 hours. ABG No results for input(s): PHART, PCO2ART, PO2ART in the last 168 hours. Liver Enzymes  Recent Labs Lab 11/14/15 0855 11/18/15 0830  AST  --  17  ALT  --  13*  ALKPHOS  --  1309*  BILITOT  --  0.7  ALBUMIN 1.6* 1.6*   Cardiac Enzymes  Recent Labs Lab 11/18/15 1810 11/19/15 0031 11/19/15 0630  TROPONINI 0.06* 0.05* 0.13*   Glucose  Recent Labs Lab 11/18/15 0743 11/18/15 1146 11/18/15 1606 11/18/15 1804 11/18/15 2353 11/19/15 0721  GLUCAP 103* 106* 109* 99 88 79    Imaging No results found.  ASSESSMENT / PLAN: IMPRESSION: Very prolonged and complicated hospitalization after gastric bypass surgery 2014 Never returned home ESRD Recurrent severe sepsis - PAN resistant Klebisella in sputum Recurrent PNA-now with pseudomonas H/O C diff H/O VTE DM2 - controlled Episodic hypoglycemia Chronic steroid therapy, for a history of of adrenal insufficiency Incidental finding of R hip fx - conservative mgmt. 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 Husband declined TEE Profound deconditioning Chronic pain New finding of pericardial friction rub 12/19 New finding of cardiomyopathy, likely post MI 12/19 Atrial flutter HCAP with MDR Klebs and  pseudomonas HYPOGLYCEMIA  Cont vent support - settings reviewed and/or adjusted Cont vent bundle Daily SBT if/when meets criteria Cont HD per renal Monitor BMET intermittently Monitor I/Os Correct electrolytes as indicated DVT px: SQ heparin Monitor CBC intermittently Transfuse per usual ICU guidelines HOLD TF's for now Monitor temp, WBC count Micro and abx as above ID following - appreciate recs On oral amiodarone  for A. Flutter Recheck CXR 12/26 Recheck resp culture 12/26 ID consult 12/26 Colistin and tigecycline 12/26 per last recs of ID 12/21 Pain meds as needed D10 Infusion for hypoglycemia  patient being kept alive with Vent and HD, prognosis is very poor, no meaningful recovery, will NOT change trach at this time due to near cardiac arrest  Recommend Comfort care measures, husband unreasonable. Patient with terminal medical conditions   The Patient requires high complexity decision making for assessment and support, frequent evaluation and titration of therapies, application of advanced monitoring technologies and extensive interpretation of multiple databases. Critical Care Time devoted to patient care services described in this note is 35 minutes.   Overall, patient is critically ill, prognosis is guarded.  Patient with Multiorgan failure and at high risk for cardiac arrest and death.    Lucie Leather, M.D.  [facility]  Medical Director [facility] Medical Director [facility] Cardio-Pulmonary Department

## 2015-11-19 NOTE — Progress Notes (Signed)
Subjective:  Hemodialysis yesterday. Tolerated treatment well. UF of 1 litre.  Nonsustained v-tach yesterday. Repleted potassium and magnesium    Objective:  Vital signs in last 24 hours:  Temp:  [98 F (36.7 C)-98.2 F (36.8 C)] 98.1 F (36.7 C) (12/31 0400) Pulse Rate:  [44-159] 60 (12/31 0852) Resp:  [9-34] 19 (12/31 0600) BP: (84-134)/(44-106) 119/50 mmHg (12/31 0852) SpO2:  [57 %-100 %] 100 % (12/31 0600) FiO2 (%):  [40 %] 40 % (12/31 0846) Weight:  [105 kg (231 lb 7.7 oz)] 105 kg (231 lb 7.7 oz) (12/31 0425)  Weight change: 2 kg (4 lb 6.6 oz) Filed Weights   11/17/15 1400 11/18/15 0459 11/19/15 0425  Weight: 103 kg (227 lb 1.2 oz) 103 kg (227 lb 1.2 oz) 105 kg (231 lb 7.7 oz)    Intake/Output: I/O last 3 completed shifts: In: 1940 [I.V.:1270; NG/GT:20; IV Piggyback:650] Out: -1000    Intake/Output this shift:     Physical Exam: General: critically ill appearing  Head/ENT: OM moist, NG tube  Eyes: Anicteric, eyes closed  Neck: Tracheostomy in place  Lungs:  bilatearl rhonchi, vent assisted PRVC fio2 40% PEEP 5  Heart: S1S2 no rubs  Abdomen:  Soft, nontender, BS present  Extremities: 2+ dependent edema, anasarca  Neurologic: Resting comfortably at the moment  Access:  R IJ permcath.       Basic Metabolic Panel:  Recent Labs Lab 11/14/15 0855 11/18/15 0830 11/18/15 1810 11/19/15 0630  NA 141 139 137 136  K 4.8 3.7 3.2* 3.5  CL 100* 101 101 100*  CO2 31 28 30 27   GLUCOSE 87 109* 105* 83  BUN 87* 58* 41* 45*  CREATININE 1.71* 1.29* 0.98 1.06*  CALCIUM 8.5* 8.2* 8.1* 8.1*  MG  --  1.7 1.6* 2.1  PHOS 4.7* 4.1  --   --     Liver Function Tests:  Recent Labs Lab 11/14/15 0855 11/18/15 0830  AST  --  17  ALT  --  13*  ALKPHOS  --  1309*  BILITOT  --  0.7  PROT  --  5.6*  ALBUMIN 1.6* 1.6*   No results for input(s): LIPASE, AMYLASE in the last 168 hours. No results for input(s): AMMONIA in the last 168 hours.  CBC:  Recent Labs Lab  11/14/15 0512 11/14/15 0855 11/16/15 1013 11/18/15 1205 11/19/15 0630  WBC 19.9* 19.1* 34.3* 31.3* 20.5*  NEUTROABS  --   --   --   --  17.2*  HGB 7.3* 7.2* 11.0* 8.7* 7.5*  HCT 24.3* 24.1* 38.1 29.0* 24.6*  MCV 94.3 94.9 100.4* 95.0 94.5  PLT 238 242 199 280 229    Cardiac Enzymes:  Recent Labs Lab 11/18/15 1810 11/19/15 0031 11/19/15 0630  TROPONINI 0.06* 0.05* 0.13*    BNP: Invalid input(s): POCBNP  CBG:  Recent Labs Lab 11/18/15 1146 11/18/15 1606 11/18/15 1804 11/18/15 2353 11/19/15 0721  GLUCAP 106* 109* 99 88 79    Microbiology: Results for orders placed or performed during the hospital encounter of 08/09/2015  Blood Culture (routine x 2)     Status: None   Collection Time: 08/14/2015  8:51 AM  Result Value Ref Range Status   Specimen Description BLOOD [name]  Final   Special Requests BOTTLES DRAWN AEROBIC AND ANAEROBIC  3CC  Final   Culture NO GROWTH 5 DAYS  Final   Report Status 08/17/2015 FINAL  Final  Blood Culture (routine x 2)     Status: None   Collection  Time: 08/18/2015  9:20 AM  Result Value Ref Range Status   Specimen Description BLOOD LEFT ARM  Final   Special Requests BOTTLES DRAWN AEROBIC AND ANAEROBIC  1CC  Final   Culture NO GROWTH 5 DAYS  Final   Report Status 08/17/2015 FINAL  Final  Wound culture     Status: None   Collection Time: 08/04/2015  9:20 AM  Result Value Ref Range Status   Specimen Description DECUBITIS  Final   Special Requests Normal  Final   Gram Stain   Final    FEW WBC SEEN MANY GRAM NEGATIVE RODS RARE GRAM POSITIVE COCCI    Culture   Final    HEAVY GROWTH ESCHERICHIA COLI MODERATE GROWTH ENTEROBACTER AEROGENES PROTEUS MIRABILIS HEAVY GROWTH ENTEROCOCCUS SPECIES VRE HAVE INTRINSIC RESISTANCE TO MOST COMMONLY USED ANTIBIOTICS AND THE ABILITY TO ACQUIRE RESISTANCE TO MOST AVAILABLE ANTIBIOTICS.    Report Status 08/16/2015 FINAL  Final   Organism ID, Bacteria ESCHERICHIA COLI  Final   Organism ID, Bacteria  ENTEROBACTER AEROGENES  Final   Organism ID, Bacteria PROTEUS MIRABILIS  Final   Organism ID, Bacteria ENTEROCOCCUS SPECIES  Final      Susceptibility   Enterobacter aerogenes - MIC*    CEFTAZIDIME <=1 SENSITIVE Sensitive     CEFAZOLIN >=64 RESISTANT Resistant     CEFTRIAXONE <=1 SENSITIVE Sensitive     CIPROFLOXACIN <=0.25 SENSITIVE Sensitive     GENTAMICIN <=1 SENSITIVE Sensitive     IMIPENEM 1 SENSITIVE Sensitive     TRIMETH/SULFA <=20 SENSITIVE Sensitive     * MODERATE GROWTH ENTEROBACTER AEROGENES   Escherichia coli - MIC*    AMPICILLIN <=2 SENSITIVE Sensitive     CEFTAZIDIME <=1 SENSITIVE Sensitive     CEFAZOLIN <=4 SENSITIVE Sensitive     CEFTRIAXONE <=1 SENSITIVE Sensitive     CIPROFLOXACIN <=0.25 SENSITIVE Sensitive     GENTAMICIN <=1 SENSITIVE Sensitive     IMIPENEM <=0.25 SENSITIVE Sensitive     TRIMETH/SULFA <=20 SENSITIVE Sensitive     * HEAVY GROWTH ESCHERICHIA COLI   Proteus mirabilis - MIC*    AMPICILLIN >=32 RESISTANT Resistant     CEFTAZIDIME <=1 SENSITIVE Sensitive     CEFAZOLIN 8 SENSITIVE Sensitive     CEFTRIAXONE <=1 SENSITIVE Sensitive     CIPROFLOXACIN <=0.25 SENSITIVE Sensitive     GENTAMICIN <=1 SENSITIVE Sensitive     IMIPENEM 1 SENSITIVE Sensitive     TRIMETH/SULFA <=20 SENSITIVE Sensitive     * PROTEUS MIRABILIS   Enterococcus species - MIC*    AMPICILLIN >=32 RESISTANT Resistant     VANCOMYCIN >=32 RESISTANT Resistant     GENTAMICIN SYNERGY SENSITIVE Sensitive     TETRACYCLINE Value in next row Resistant      RESISTANT>=16    * HEAVY GROWTH ENTEROCOCCUS SPECIES  MRSA PCR Screening     Status: None   Collection Time: 07/29/2015  2:38 PM  Result Value Ref Range Status   MRSA by PCR NEGATIVE NEGATIVE Final    Comment:        The GeneXpert MRSA Assay (FDA approved for NASAL specimens only), is one component of a comprehensive MRSA colonization surveillance program. It is not intended to diagnose MRSA infection nor to guide or monitor  treatment for MRSA infections.   Blood culture (single)     Status: None   Collection Time: 08/16/2015  3:36 PM  Result Value Ref Range Status   Specimen Description BLOOD RIGHT ASSIST CONTROL  Final   Special Requests  BOTTLES DRAWN AEROBIC AND ANAEROBIC  Final   Culture NO GROWTH 5 DAYS  Final   Report Status 08/17/2015 FINAL  Final  Wound culture     Status: None   Collection Time: 08/13/15 12:37 PM  Result Value Ref Range Status   Specimen Description WOUND  Final   Special Requests Normal  Final   Gram Stain   Final    FEW WBC SEEN TOO NUMEROUS TO COUNT GRAM NEGATIVE RODS FEW GRAM POSITIVE COCCI    Culture   Final    HEAVY GROWTH ESCHERICHIA COLI MODERATE GROWTH PROTEUS MIRABILIS LIGHT GROWTH KLEBSIELLA PNEUMONIAE MODERATE GROWTH ENTEROCOCCUS GALLINARUM CRITICAL RESULT CALLED TO, READ BACK BY AND VERIFIED WITH: [facility] BORBA AT 1042 08/16/15 DV    Report Status 08/17/2015 FINAL  Final   Organism ID, Bacteria ESCHERICHIA COLI  Final   Organism ID, Bacteria PROTEUS MIRABILIS  Final   Organism ID, Bacteria KLEBSIELLA PNEUMONIAE  Final   Organism ID, Bacteria ENTEROCOCCUS GALLINARUM  Final      Susceptibility   Escherichia coli - MIC*    AMPICILLIN >=32 RESISTANT Resistant     CEFTAZIDIME 4 RESISTANT Resistant     CEFAZOLIN >=64 RESISTANT Resistant     CEFTRIAXONE 16 RESISTANT Resistant     GENTAMICIN 2 SENSITIVE Sensitive     IMIPENEM >=16 RESISTANT Resistant     TRIMETH/SULFA <=20 SENSITIVE Sensitive     Extended ESBL POSITIVE Resistant     PIP/TAZO Value in next row Resistant      RESISTANT>=128    CIPROFLOXACIN Value in next row Sensitive      SENSITIVE<=0.25    * HEAVY GROWTH ESCHERICHIA COLI   Klebsiella pneumoniae - MIC*    AMPICILLIN Value in next row Resistant      SENSITIVE<=0.25    CEFTAZIDIME Value in next row Resistant      SENSITIVE<=0.25    CEFAZOLIN Value in next row Resistant      SENSITIVE<=0.25    CEFTRIAXONE Value in next row Resistant       SENSITIVE<=0.25    CIPROFLOXACIN Value in next row Resistant      SENSITIVE<=0.25    GENTAMICIN Value in next row Sensitive      SENSITIVE<=0.25    IMIPENEM Value in next row Resistant      SENSITIVE<=0.25    TRIMETH/SULFA Value in next row Resistant      SENSITIVE<=0.25    PIP/TAZO Value in next row Resistant      RESISTANT>=128    * LIGHT GROWTH KLEBSIELLA PNEUMONIAE   Proteus mirabilis - MIC*    AMPICILLIN Value in next row Resistant      RESISTANT>=128    CEFTAZIDIME Value in next row Sensitive      RESISTANT>=128    CEFAZOLIN Value in next row Sensitive      RESISTANT>=128    CEFTRIAXONE Value in next row Sensitive      RESISTANT>=128    CIPROFLOXACIN Value in next row Sensitive      RESISTANT>=128    GENTAMICIN Value in next row Sensitive      RESISTANT>=128    IMIPENEM Value in next row Sensitive      RESISTANT>=128    TRIMETH/SULFA Value in next row Sensitive      RESISTANT>=128    PIP/TAZO Value in next row Sensitive      SENSITIVE<=4    * MODERATE GROWTH PROTEUS MIRABILIS   Enterococcus gallinarum - MIC*    AMPICILLIN Value in next row Resistant  SENSITIVE<=4    GENTAMICIN SYNERGY Value in next row Sensitive      SENSITIVE<=4    CIPROFLOXACIN Value in next row Resistant      RESISTANT>=8    TETRACYCLINE Value in next row Resistant      RESISTANT>=16    * MODERATE GROWTH ENTEROCOCCUS GALLINARUM  Wound culture     Status: None   Collection Time: 08/15/15  2:53 PM  Result Value Ref Range Status   Specimen Description WOUND  Final   Special Requests NONE  Final   Gram Stain FEW WBC SEEN NO ORGANISMS SEEN   Final   Culture NO GROWTH 3 DAYS  Final   Report Status 08/18/2015 FINAL  Final  C difficile quick scan w PCR reflex     Status: None   Collection Time: 08/17/15 11:34 AM  Result Value Ref Range Status   C Diff antigen NEGATIVE NEGATIVE Final   C Diff toxin NEGATIVE NEGATIVE Final   C Diff interpretation Negative for C. difficile  Final  Stool  culture     Status: None   Collection Time: 09/03/15  3:51 PM  Result Value Ref Range Status   Specimen Description STOOL  Final   Special Requests Immunocompromised  Final   Culture   Final    NO SALMONELLA OR SHIGELLA ISOLATED No Pathogenic E. coli detected NO CAMPYLOBACTER DETECTED    Report Status 09/07/2015 FINAL  Final  C difficile quick scan w PCR reflex     Status: None   Collection Time: 09/13/15 12:51 AM  Result Value Ref Range Status   C Diff antigen NEGATIVE NEGATIVE Final   C Diff toxin NEGATIVE NEGATIVE Final   C Diff interpretation Negative for C. difficile  Final  Culture, blood (routine x 2)     Status: None   Collection Time: 09/16/15 11:24 AM  Result Value Ref Range Status   Specimen Description BLOOD LEFT HAND  Final   Special Requests BOTTLES DRAWN AEROBIC AND ANAEROBIC  1CC  Final   Culture NO GROWTH 5 DAYS  Final   Report Status 09/21/2015 FINAL  Final  Culture, blood (routine x 2)     Status: None   Collection Time: 09/16/15 12:23 PM  Result Value Ref Range Status   Specimen Description BLOOD RIGHT HAND  Final   Special Requests BOTTLES DRAWN AEROBIC AND ANAEROBIC  1CC  Final   Culture  Setup Time   Final    GRAM POSITIVE COCCI AEROBIC BOTTLE ONLY CRITICAL RESULT CALLED TO, READ BACK BY AND VERIFIED WITH: TESS THOMAS,RN 09/17/2015 0631 BY JRS.    Culture   Final    ENTEROCOCCUS FAECALIS AEROBIC BOTTLE ONLY VRE HAVE INTRINSIC RESISTANCE TO MOST COMMONLY USED ANTIBIOTICS AND THE ABILITY TO ACQUIRE RESISTANCE TO MOST AVAILABLE ANTIBIOTICS. CRITICAL RESULT CALLED TO, READ BACK BY AND VERIFIED WITH: CHERYL SMITH AT 1610 09/19/15 DV    Report Status 09/21/2015 FINAL  Final   Organism ID, Bacteria ENTEROCOCCUS FAECALIS  Final      Susceptibility   Enterococcus faecalis - MIC*    AMPICILLIN <=2 SENSITIVE Sensitive     LINEZOLID 2 SENSITIVE Sensitive     CIPROFLOXACIN Value in next row Resistant      RESISTANT>=8    TETRACYCLINE Value in next row  Resistant      RESISTANT>=16    VANCOMYCIN Value in next row Resistant      RESISTANT>=32    GENTAMICIN SYNERGY Value in next row Resistant  RESISTANT>=32    * ENTEROCOCCUS FAECALIS  Culture, respiratory (NON-Expectorated)     Status: None   Collection Time: 09/16/15  3:50 PM  Result Value Ref Range Status   Specimen Description TRACHEAL ASPIRATE  Final   Special Requests Immunocompromised  Final   Gram Stain   Final    FEW WBC SEEN GOOD SPECIMEN - 80-90% WBCS RARE GRAM NEGATIVE RODS    Culture LIGHT GROWTH PSEUDOMONAS AERUGINOSA  Final   Report Status 09/19/2015 FINAL  Final   Organism ID, Bacteria PSEUDOMONAS AERUGINOSA  Final      Susceptibility   Pseudomonas aeruginosa - MIC*    CEFTAZIDIME 8 SENSITIVE Sensitive     CIPROFLOXACIN 2 INTERMEDIATE Intermediate     GENTAMICIN >=16 RESISTANT Resistant     IMIPENEM >=16 RESISTANT Resistant     PIP/TAZO Value in next row Sensitive      SENSITIVE32    CEFEPIME Value in next row Sensitive      SENSITIVE8    LEVOFLOXACIN Value in next row Resistant      RESISTANT>=8    * LIGHT GROWTH PSEUDOMONAS AERUGINOSA  Culture, expectorated sputum-assessment     Status: None   Collection Time: 09/22/15  2:09 PM  Result Value Ref Range Status   Specimen Description ENDOTRACHEAL  Final   Special Requests Normal  Final   Sputum evaluation THIS SPECIMEN IS ACCEPTABLE FOR SPUTUM CULTURE  Final   Report Status 09/24/2015 FINAL  Final  Culture, respiratory (NON-Expectorated)     Status: None   Collection Time: 09/22/15  2:09 PM  Result Value Ref Range Status   Specimen Description ENDOTRACHEAL  Final   Special Requests Normal Reflexed from [id number]  Final   Gram Stain   Final    FEW WBC SEEN FEW GRAM NEGATIVE RODS POOR SPECIMEN - LESS THAN 70% WBCS    Culture   Final    MODERATE GROWTH PSEUDOMONAS AERUGINOSA LIGHT GROWTH KLEBSIELLA PNEUMONIAE REFER TO SENSITIVITIES FROM PREVIOUS CULTURE FOR ORG 2    Report Status 10/01/2015 FINAL   Final   Organism ID, Bacteria PSEUDOMONAS AERUGINOSA  Final   Organism ID, Bacteria KLEBSIELLA PNEUMONIAE  Final      Susceptibility   Pseudomonas aeruginosa - MIC*    CEFTAZIDIME 4 SENSITIVE Sensitive     CIPROFLOXACIN 2 INTERMEDIATE Intermediate     GENTAMICIN >=16 RESISTANT Resistant     IMIPENEM >=16 RESISTANT Resistant     PIP/TAZO Value in next row Sensitive      SENSITIVE16    * MODERATE GROWTH PSEUDOMONAS AERUGINOSA  Tissue culture     Status: None   Collection Time: 09/24/15  6:44 AM  Result Value Ref Range Status   Specimen Description BONE  Final   Special Requests Normal  Final   Gram Stain MODERATE WBC SEEN FEW GRAM NEGATIVE RODS   Final   Culture   Final    MODERATE GROWTH ESCHERICHIA COLI MODERATE GROWTH KLEBSIELLA PNEUMONIAE ESBL-EXTENDED SPECTRUM BETA LACTAMASE-THE ORGANISM IS RESISTANT TO PENICILLINS, CEPHALOSPORINS AND AZTREONAM ACCORDING TO CLSI M100-S15 VOL.25 N01 JAN 2005. ORGANISM 1 This organism isolate is resistant to one or more antiotic agents in three or more antimicrobial categories.  Suggest Infectious Disease consult.   ORGANISM 2 CRITICAL RESULT CALLED TO, READ BACK BY AND VERIFIED WITH: RN Mardene CelesteJOANNA LINDSAY 09/27/15 1005AM    Report Status 09/28/2015 FINAL  Final   Organism ID, Bacteria ESCHERICHIA COLI  Final   Organism ID, Bacteria KLEBSIELLA PNEUMONIAE  Final  Susceptibility   Escherichia coli - MIC*    AMPICILLIN >=32 RESISTANT Resistant     CEFTAZIDIME 4 RESISTANT Resistant     CEFAZOLIN >=64 RESISTANT Resistant     CEFTRIAXONE 8 RESISTANT Resistant     GENTAMICIN <=1 SENSITIVE Sensitive     IMIPENEM 8 RESISTANT Resistant     TRIMETH/SULFA <=20 SENSITIVE Sensitive     Extended ESBL POSITIVE Resistant     PIP/TAZO Value in next row Resistant      RESISTANT>=128    * MODERATE GROWTH ESCHERICHIA COLI   Klebsiella pneumoniae - MIC*    AMPICILLIN Value in next row Resistant      RESISTANT>=128    CEFTAZIDIME Value in next row Resistant       RESISTANT>=128    CEFAZOLIN Value in next row Resistant      RESISTANT>=128    CEFTRIAXONE Value in next row Resistant      RESISTANT>=128    CIPROFLOXACIN Value in next row Resistant      RESISTANT>=128    GENTAMICIN Value in next row Resistant      RESISTANT>=128    IMIPENEM Value in next row Resistant      RESISTANT>=128    TRIMETH/SULFA Value in next row Sensitive      RESISTANT>=128    PIP/TAZO Value in next row Resistant      RESISTANT>=128    * MODERATE GROWTH KLEBSIELLA PNEUMONIAE  Anaerobic culture     Status: None   Collection Time: 09/24/15  3:55 PM  Result Value Ref Range Status   Specimen Description BONE  Final   Special Requests Normal  Final   Culture NO ANAEROBES ISOLATED  Final   Report Status 09/29/2015 FINAL  Final  Culture, fungus without smear     Status: None   Collection Time: 09/24/15  3:55 PM  Result Value Ref Range Status   Specimen Description BONE  Final   Special Requests Normal  Final   Culture NO FUNGUS ISOLATED AFTER 24 DAYS  Final   Report Status 10/18/2015 FINAL  Final  C difficile quick scan w PCR reflex     Status: None   Collection Time: 10/07/15  2:02 PM  Result Value Ref Range Status   C Diff antigen NEGATIVE NEGATIVE Final   C Diff toxin NEGATIVE NEGATIVE Final   C Diff interpretation Negative for C. difficile  Final  Culture, blood (routine x 2)     Status: None   Collection Time: 10/16/15  9:52 AM  Result Value Ref Range Status   Specimen Description BLOOD LEFT HAND  Final   Special Requests   Final    BOTTLES DRAWN AEROBIC AND ANAEROBIC  AER 6CC ANA 4CC   Culture NO GROWTH 6 DAYS  Final   Report Status 10/22/2015 FINAL  Final  Culture, blood (routine x 2)     Status: None   Collection Time: 10/16/15 12:25 PM  Result Value Ref Range Status   Specimen Description BLOOD LEFT HAND  Final   Special Requests BOTTLES DRAWN AEROBIC AND ANAEROBIC  5CC  Final   Culture NO GROWTH 6 DAYS  Final   Report Status 10/22/2015 FINAL   Final  Culture, expectorated sputum-assessment     Status: None   Collection Time: 10/18/15  1:15 PM  Result Value Ref Range Status   Specimen Description SPUTUM  Final   Special Requests NONE  Final   Sputum evaluation THIS SPECIMEN IS ACCEPTABLE FOR SPUTUM CULTURE  Final  Report Status 10/18/2015 FINAL  Final  Culture, respiratory (NON-Expectorated)     Status: None   Collection Time: 10/18/15  1:15 PM  Result Value Ref Range Status   Specimen Description SPUTUM  Final   Special Requests NONE Reflexed from [id number]  Final   Gram Stain   Final    GOOD SPECIMEN - 80-90% WBCS MODERATE WBC SEEN MANY GRAM NEGATIVE RODS    Culture   Final    HEAVY GROWTH PSEUDOMONAS AERUGINOSA MODERATE GROWTH KLEBSIELLA PNEUMONIAE CRITICAL RESULT CALLED TO, READ BACK BY AND VERIFIED WITHRod Mae AT 4098 10/21/15 DV KLEBSIELLA PNEUMONIAE This organism isolate is resistant to one or more antiotic agents in three or more antimicrobial categories.  Suggest Infectious Disease  consult.      Report Status 10/22/2015 FINAL  Final   Organism ID, Bacteria PSEUDOMONAS AERUGINOSA  Final   Organism ID, Bacteria KLEBSIELLA PNEUMONIAE  Final      Susceptibility   Klebsiella pneumoniae - MIC*    AMPICILLIN >=32 RESISTANT Resistant     CEFAZOLIN >=64 RESISTANT Resistant     CEFTRIAXONE >=64 RESISTANT Resistant     CIPROFLOXACIN >=4 RESISTANT Resistant     GENTAMICIN >=16 RESISTANT Resistant     IMIPENEM >=16 RESISTANT Resistant     NITROFURANTOIN >=512 RESISTANT Resistant     TRIMETH/SULFA <=20 SENSITIVE Sensitive     * MODERATE GROWTH KLEBSIELLA PNEUMONIAE   Pseudomonas aeruginosa - MIC*    CEFTAZIDIME 4 SENSITIVE Sensitive     CIPROFLOXACIN >=4 RESISTANT Resistant     GENTAMICIN 8 INTERMEDIATE Intermediate     IMIPENEM >=16 RESISTANT Resistant     PIP/TAZO Value in next row Sensitive      SENSITIVE32    * HEAVY GROWTH PSEUDOMONAS AERUGINOSA  C difficile quick scan w PCR reflex     Status: None    Collection Time: 10/18/15  4:39 PM  Result Value Ref Range Status   C Diff antigen NEGATIVE NEGATIVE Final   C Diff toxin NEGATIVE NEGATIVE Final   C Diff interpretation Negative for C. difficile  Final  Culture, blood (Routine X 2) w Reflex to ID Panel     Status: None   Collection Time: 11/06/15  8:27 AM  Result Value Ref Range Status   Specimen Description BLOOD RIGHT HAND  Final   Special Requests BOTTLES DRAWN AEROBIC AND ANAEROBIC 3CC  Final   Culture NO GROWTH 5 DAYS  Final   Report Status 11/11/2015 FINAL  Final  Culture, blood (Routine X 2) w Reflex to ID Panel     Status: None   Collection Time: 11/06/15  8:29 AM  Result Value Ref Range Status   Specimen Description BLOOD LEFT HAND  Final   Special Requests BOTTLES DRAWN AEROBIC AND ANAEROBIC  1CC  Final   Culture NO GROWTH 5 DAYS  Final   Report Status 11/11/2015 FINAL  Final  Culture, expectorated sputum-assessment     Status: None   Collection Time: 11/06/15  8:41 AM  Result Value Ref Range Status   Specimen Description EXPECTORATED SPUTUM  Final   Special Requests Immunocompromised  Final   Sputum evaluation THIS SPECIMEN IS ACCEPTABLE FOR SPUTUM CULTURE  Final   Report Status 11/06/2015 FINAL  Final  Culture, respiratory (NON-Expectorated)     Status: None   Collection Time: 11/06/15  8:41 AM  Result Value Ref Range Status   Specimen Description EXPECTORATED SPUTUM  Final   Special Requests Immunocompromised Reflexed from [id number]  Final  Gram Stain   Final    MANY WBC SEEN MANY GRAM NEGATIVE RODS EXCELLENT SPECIMEN - 90-100% WBCS    Culture   Final    MODERATE GROWTH KLEBSIELLA PNEUMONIAE MODERATE GROWTH PSEUDOMONAS AERUGINOSA CRITICAL RESULT CALLED TO, READ BACK BY AND VERIFIED WITH: DR. FITZGERALD AT 1120 11/09/15 DV DR. FITZGERALD REQUESTED ORGANISM BE SENT OUT FOR TIGECYCLINE CEFT/AVIBACTAM AND COLISTIN Sent to [facility] for further susceptibility testing. 11/11/15 CTJ    Report Status 11/15/2015 FINAL   Final   Organism ID, Bacteria KLEBSIELLA PNEUMONIAE  Final   Organism ID, Bacteria PSEUDOMONAS AERUGINOSA  Final      Susceptibility   Klebsiella pneumoniae - MIC*    AMPICILLIN >=32 RESISTANT Resistant     CEFAZOLIN >=64 RESISTANT Resistant     CEFTRIAXONE 32 RESISTANT Resistant     CIPROFLOXACIN >=4 RESISTANT Resistant     GENTAMICIN >=16 RESISTANT Resistant     IMIPENEM >=16 RESISTANT Resistant     NITROFURANTOIN 256 RESISTANT Resistant     TRIMETH/SULFA >=320 RESISTANT Resistant     Extended ESBL POSITIVE Resistant     * MODERATE GROWTH KLEBSIELLA PNEUMONIAE   Pseudomonas aeruginosa - MIC*    CEFTAZIDIME >=64 RESISTANT Resistant     CIPROFLOXACIN 2 INTERMEDIATE Intermediate     GENTAMICIN >=16 RESISTANT Resistant     IMIPENEM >=16 RESISTANT Resistant     * MODERATE GROWTH PSEUDOMONAS AERUGINOSA  Culture, respiratory (NON-Expectorated)     Status: None   Collection Time: 11/14/15  2:07 PM  Result Value Ref Range Status   Specimen Description TRACHEAL ASPIRATE  Final   Special Requests NONE  Final   Gram Stain   Final    EXCELLENT SPECIMEN - 90-100% WBCS MANY WBC SEEN MODERATE GRAM NEGATIVE RODS    Culture   Final    HEAVY GROWTH PSEUDOMONAS AERUGINOSA CRITICAL VALUE NOTED.  VALUE IS CONSISTENT WITH PREVIOUSLY REPORTED AND CALLED VALUE.    Report Status 11/16/2015 FINAL  Final   Organism ID, Bacteria PSEUDOMONAS AERUGINOSA  Final      Susceptibility   Pseudomonas aeruginosa - MIC*    CEFTAZIDIME >=64 RESISTANT Resistant     CIPROFLOXACIN 2 INTERMEDIATE Intermediate     GENTAMICIN >=16 RESISTANT Resistant     IMIPENEM >=16 RESISTANT Resistant     PIP/TAZO Value in next row Resistant      RESISTANT>=128    TOBRAMYCIN Value in next row Sensitive      SENSITIVE2    * HEAVY GROWTH PSEUDOMONAS AERUGINOSA    Coagulation Studies: No results for input(s): LABPROT, INR in the last 72 hours.  Urinalysis: No results for input(s): COLORURINE, LABSPEC, PHURINE, GLUCOSEU,  HGBUR, BILIRUBINUR, KETONESUR, PROTEINUR, UROBILINOGEN, NITRITE, LEUKOCYTESUR in the last 72 hours.  Invalid input(s): APPERANCEUR    Imaging: No results found.   Medications:   . dextrose 50 mL/hr at 11/19/15 0225  . feeding supplement (JEVITY 1.5 CAL/FIBER)    . norepinephrine (LEVOPHED) Adult infusion     . amiodarone  200 mg Per Tube BID   Followed by  . [START ON 11/25/2015] amiodarone  200 mg Per Tube Daily  . antiseptic oral rinse  7 mL Mouth Rinse QID  . aspirin  81 mg Oral Daily  . chlorhexidine gluconate  15 mL Mouth Rinse BID  . colistimethate (COLISTIN) IV  250 mg Intravenous Q48H  . epoetin (EPOGEN/PROCRIT) injection  10,000 Units Intravenous Q M,W,F-HD  . escitalopram  10 mg Oral Daily  . feeding supplement (PRO-STAT SUGAR FREE 64)  30 mL Oral 6 X Daily  . free water  20 mL Per Tube 6 times per day  . heparin subcutaneous  5,000 Units Subcutaneous Q12H  . hydrocortisone  10 mg Per Tube BID  . lidocaine  1 patch Transdermal Q24H  . metoprolol tartrate  12.5 mg Oral BID  . midodrine  10 mg Per Tube TID WC  . mirtazapine  7.5 mg Oral QHS  . multivitamin  5 mL Oral Daily  . pantoprazole sodium  40 mg Per Tube Daily  . saccharomyces boulardii  250 mg Oral BID  . sodium chloride  10-40 mL Intracatheter Q12H  . sodium hypochlorite   Irrigation BID  . tigecycline (TYGACIL) IVPB  50 mg Intravenous Q12H  . tobramycin (PF)  300 mg Nebulization BID   acetaminophen (TYLENOL) oral liquid 160 mg/5 mL, HYDROmorphone (DILAUDID) injection, ipratropium-albuterol, loperamide, morphine injection, ondansetron (ZOFRAN) IV, oxyCODONE, polyvinyl alcohol, sodium chloride, zinc oxide  Assessment/ Plan:  49 y.o. 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 Admitted on 07/27/2015  Echo: 11/07/19: EF of 30% to 35%.   1. End-stage renal disease on hemodialysis on HD MWF. The patient has been on dialysis since October of 2014. R IJ permcath.  - Dialysis treatment with IV albumin for oncotic support.  - Midodrine and hydrocortisone ordered for blood pressure support.  - Hemodialysis yesterday, tolerated treatment well  - Continue MWF schedule, monitor daily for dailysis treatment.   2. Anemia of CKD. Hemoglobin 7.5, unclear where reading of 11 came from  - Epogen and periodic blood transfusions.   3. Acute resp failure: ventilator dependent -prolonged course on ventilator, fio2 currently 40% with PEEP of 5.   4. Sepsis: pseudomonas in tracheal aspirate - tobramycin and tigecycline.   5. Secondary Hyperparathyroidism: PTH 50, phos 4.1 - not currently on a binder or vitamin D agent.     LOS: 99 Courtney [name] 12/31/20169:16 AM

## 2015-11-19 NOTE — Progress Notes (Signed)
Run of non-sustained VT.  Patient alert at time.  Nurse present in room when VT started during dressing change.  B/P stable.

## 2015-11-20 LAB — CBC WITH DIFFERENTIAL/PLATELET
BASOS PCT: 1 %
Basophils Absolute: 0.1 10*3/uL (ref 0–0.1)
EOS ABS: 0.2 10*3/uL (ref 0–0.7)
EOS PCT: 1 %
HCT: 24.8 % — ABNORMAL LOW (ref 35.0–47.0)
Hemoglobin: 7.3 g/dL — ABNORMAL LOW (ref 12.0–16.0)
LYMPHS PCT: 6 %
Lymphs Abs: 1.1 10*3/uL (ref 1.0–3.6)
MCH: 27.8 pg (ref 26.0–34.0)
MCHC: 29.6 g/dL — AB (ref 32.0–36.0)
MCV: 94 fL (ref 80.0–100.0)
MONO ABS: 1.1 10*3/uL — AB (ref 0.2–0.9)
Monocytes Relative: 6 %
NEUTROS ABS: 18.2 10*3/uL — AB (ref 1.4–6.5)
Neutrophils Relative %: 88 %
PLATELETS: 229 10*3/uL (ref 150–440)
RBC: 2.64 MIL/uL — ABNORMAL LOW (ref 3.80–5.20)
RDW: 19.4 % — AB (ref 11.5–14.5)
WBC: 20.7 10*3/uL — AB (ref 3.6–11.0)

## 2015-11-20 LAB — GLUCOSE, CAPILLARY
GLUCOSE-CAPILLARY: 104 mg/dL — AB (ref 65–99)
GLUCOSE-CAPILLARY: 108 mg/dL — AB (ref 65–99)
GLUCOSE-CAPILLARY: 94 mg/dL (ref 65–99)
Glucose-Capillary: 126 mg/dL — ABNORMAL HIGH (ref 65–99)

## 2015-11-20 LAB — PREPARE RBC (CROSSMATCH)

## 2015-11-20 MED ORDER — SODIUM CHLORIDE 0.9 % IV SOLN
Freq: Once | INTRAVENOUS | Status: AC
  Administered 2015-11-20: 16:00:00 via INTRAVENOUS

## 2015-11-20 NOTE — Progress Notes (Signed)
ANTIBIOTIC CONSULT NOTE - follow Up  Pharmacy Consult for Colistin and Tigecycline  Indication: MDR Respiratory Infection   Allergies  Allergen Reactions  . Contrast Media [Iodinated Diagnostic Agents] Anaphylaxis  . Ampicillin Rash   Patient Measurements: Height:  (SCALE BROKE) Weight: 233 lb 11 oz (106 kg) IBW/kg (Calculated) : 66.2  Vital Signs: Temp: 98 F (36.7 C) (01/01 0800) Temp Source: Axillary (01/01 0800) BP: 113/67 mmHg (01/01 0800) Pulse Rate: 63 (01/01 0800) Intake/Output from previous day: 12/31 0701 - 01/01 0700 In: 1450 [I.V.:1250; IV Piggyback:200] Out: -  Intake/Output from this shift:    Recent Labs  11/18/15 0830 11/18/15 1205 11/18/15 1810 11/19/15 0630  WBC  --  31.3*  --  20.5*  HGB  --  8.7*  --  7.5*  PLT  --  280  --  229  CREATININE 1.29*  --  0.98 1.06*   Estimated Creatinine Clearance: 82.3 mL/min (by C-G formula based on Cr of 1.06). No results for input(s): VANCOTROUGH, VANCOPEAK, VANCORANDOM, GENTTROUGH, GENTPEAK, GENTRANDOM, TOBRATROUGH, TOBRAPEAK, TOBRARND, AMIKACINPEAK, AMIKACINTROU, AMIKACIN in the last 72 hours.   Microbiology: Recent Results (from the past 720 hour(s))  Culture, blood (Routine X 2) w Reflex to ID Panel     Status: None   Collection Time: 11/06/15  8:27 AM  Result Value Ref Range Status   Specimen Description BLOOD RIGHT HAND  Final   Special Requests BOTTLES DRAWN AEROBIC AND ANAEROBIC 3CC  Final   Culture NO GROWTH 5 DAYS  Final   Report Status 11/11/2015 FINAL  Final  Culture, blood (Routine X 2) w Reflex to ID Panel     Status: None   Collection Time: 11/06/15  8:29 AM  Result Value Ref Range Status   Specimen Description BLOOD LEFT HAND  Final   Special Requests BOTTLES DRAWN AEROBIC AND ANAEROBIC  1CC  Final   Culture NO GROWTH 5 DAYS  Final   Report Status 11/11/2015 FINAL  Final  Culture, expectorated sputum-assessment     Status: None   Collection Time: 11/06/15  8:41 AM  Result Value Ref Range  Status   Specimen Description EXPECTORATED SPUTUM  Final   Special Requests Immunocompromised  Final   Sputum evaluation THIS SPECIMEN IS ACCEPTABLE FOR SPUTUM CULTURE  Final   Report Status 11/06/2015 FINAL  Final  Culture, respiratory (NON-Expectorated)     Status: None   Collection Time: 11/06/15  8:41 AM  Result Value Ref Range Status   Specimen Description EXPECTORATED SPUTUM  Final   Special Requests Immunocompromised Reflexed from [id number]  Final   Gram Stain   Final    MANY WBC SEEN MANY GRAM NEGATIVE RODS EXCELLENT SPECIMEN - 90-100% WBCS    Culture   Final    MODERATE GROWTH KLEBSIELLA PNEUMONIAE MODERATE GROWTH PSEUDOMONAS AERUGINOSA CRITICAL RESULT CALLED TO, READ BACK BY AND VERIFIED WITH: DR. FITZGERALD AT 1120 11/09/15 DV DR. FITZGERALD REQUESTED ORGANISM BE SENT OUT FOR TIGECYCLINE CEFT/AVIBACTAM AND COLISTIN Sent to [facility] for further susceptibility testing. 11/11/15 CTJ    Report Status 11/15/2015 FINAL  Final   Organism ID, Bacteria KLEBSIELLA PNEUMONIAE  Final   Organism ID, Bacteria PSEUDOMONAS AERUGINOSA  Final      Susceptibility   Klebsiella pneumoniae - MIC*    AMPICILLIN >=32 RESISTANT Resistant     CEFAZOLIN >=64 RESISTANT Resistant     CEFTRIAXONE 32 RESISTANT Resistant     CIPROFLOXACIN >=4 RESISTANT Resistant     GENTAMICIN >=16 RESISTANT Resistant  IMIPENEM >=16 RESISTANT Resistant     NITROFURANTOIN 256 RESISTANT Resistant     TRIMETH/SULFA >=320 RESISTANT Resistant     Extended ESBL POSITIVE Resistant     * MODERATE GROWTH KLEBSIELLA PNEUMONIAE   Pseudomonas aeruginosa - MIC*    CEFTAZIDIME >=64 RESISTANT Resistant     CIPROFLOXACIN 2 INTERMEDIATE Intermediate     GENTAMICIN >=16 RESISTANT Resistant     IMIPENEM >=16 RESISTANT Resistant     * MODERATE GROWTH PSEUDOMONAS AERUGINOSA  Culture, respiratory (NON-Expectorated)     Status: None   Collection Time: 11/14/15  2:07 PM  Result Value Ref Range Status   Specimen Description TRACHEAL  ASPIRATE  Final   Special Requests NONE  Final   Gram Stain   Final    EXCELLENT SPECIMEN - 90-100% WBCS MANY WBC SEEN MODERATE GRAM NEGATIVE RODS    Culture   Final    HEAVY GROWTH PSEUDOMONAS AERUGINOSA CRITICAL VALUE NOTED.  VALUE IS CONSISTENT WITH PREVIOUSLY REPORTED AND CALLED VALUE.    Report Status 11/16/2015 FINAL  Final   Organism ID, Bacteria PSEUDOMONAS AERUGINOSA  Final      Susceptibility   Pseudomonas aeruginosa - MIC*    CEFTAZIDIME >=64 RESISTANT Resistant     CIPROFLOXACIN 2 INTERMEDIATE Intermediate     GENTAMICIN >=16 RESISTANT Resistant     IMIPENEM >=16 RESISTANT Resistant     PIP/TAZO Value in next row Resistant      RESISTANT>=128    TOBRAMYCIN Value in next row Sensitive      SENSITIVE2    * HEAVY GROWTH PSEUDOMONAS AERUGINOSA    Medical History: Past Medical History  Diagnosis Date  . Obesity   . Dyslipidemia   . Hypertension   . Coronary artery disease     s/p BMS 2010 LAD  . Dysrhythmia     ventricular tachycardia resolved after LAD stent and beta blocker  . Diabetes mellitus     with retinopathy, neuropathy and microalbuminemia  . 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 Patient is on day 97 of hospital stay, and has been treated for multiple infections during this time.   11/06/15: Respiratory cultures showing Pan-resistant  Klebsiella pneumoniae and Pseudomonas aeruginosa  Sputum has been sent to [facility] for susceptibility testing per Dr. Fitzgerald's request.   Patient has increased respiratory secretions, leukocytosis and low grade fever   12/30: Alk phos elevated at 1309  Plan:  Sensitivity results for Avycaz, tigecycline, and colistin still  pending.   Continue tigecycline 50mg IV q12h.   Continue  colistimethate 250mg (~2.5 mg/kg) IV q48 hours.   Patient should be monitored for nephrotoxicity  and neurotoxicity.  Pharmacy to continue to follow per consult.      Teldrin D. James, PharmD   11/20/2015 9:01 AM  

## 2015-11-20 NOTE — Progress Notes (Signed)
eLink Physician-Brief Progress Note Patient Name: Courtney GrinderHedda McMillian [name] DOB: 12-10-1965 MRN: [id number]   Date of Service  11/20/2015  HPI/Events of Note  PVCs noted.  Frequent.  eICU Interventions  bmet and mg checked      Intervention Category Major Interventions: Electrolyte abnormality - evaluation and management  Shan Levansatrick Dexter Signor 11/20/2015, 4:48 PM

## 2015-11-20 NOTE — Progress Notes (Signed)
PULMONARY / CRITICAL CARE MEDICINE   Name: Courtney [name] MRN: [id number] DOB: 1966-08-31    ADMISSION DATE:  08/29/2015  BRIEF HISTORY: 49 AAF who has been in medical facilities (hosp, LTAC, rehab) for 2 yrs following gastric bypass surgery with multiple complications. Now with chronic trach, ESRD, profound debilitation, severe sacral pressure ulcer. Was seemingly making progress and transferred to rehab facility approx one week prior to this admission. She was sent to [facility] ED with AMS and hypotension. Working dx of severe sepsis/septic shock due to infected sacral pressure ulcer. Since admission. Her course is been very complicated with numerous complications including septic shock and GI bleeding. Now with failure to wean from vent and possible MI event, repeat ECHO EF 30-35, possible old MI, severe hypokinesis of the nnterior and anteroseptal myocardium   INTERVAL HISTORY: intermittent V tach on oral amiodarone Now on full vent support TF's to be restarted at low dose Inhaled tobrramycin for resistant pseudomonas  Summary of MAJOR EVENTS/TEST RESULTS: Admission 02/07/14-05/07/14 Admission 07/21/14-09/06/14 Discharged to [facility]. Pt had palliative consult at that time, were asked to sign off by husband.  09/23 CT head: NAD 09/23 EEG: no epileptiform activity 09/23 PRBCs for Hgb 6.4 09/24 bedside debridement of sacral wound. Abscess drained 09/25 Off vasopressors. More alert. No distress. Worsening thrombocytopenia. Vanc DC'd 09/29 Dr. Sampson Goon (I.D) excused from the case by patient's husband. 10/02 MRI -multiple infarcts 10/03 tracheal bleeding- transfused platelets 10/03 hospitalist service excused from the case by patient's husband 10/03 Echocardiogram ejection fraction was 55-60%, pulmonary systolic pressure was 39 mmHg 40/98 restart TF's at lower rate, attempt reg HD  10/12 Transferred to med-surg floor. Remains on PCCM service 10/14 SLP eval: pt unable to tolerate PMV  adequately 10/17-will re-attempt PM valve-discussed with Speech therapist 10/18 passed swallow eval-start pureed thick foods no thin liquids-continue NG feeds 10/19 transferred to step down for sepsis/aspiration pneumonia 10/19 cxr shows RLL opacity 10/21 sacral decub debride at bedside by surgery 10/22 started back on vasopressors while on HD 10/27 CT with osteo, R hip fx, unable to identify tip of dubhoff tube - sent for fluoro study 10/28 Ortho consultation: I do not feel that she is a surgical candidate. Therefore, I feel that it would best to manage this fracture nonsurgically and allow it to heal by itself over time, which it should.  10/28 Gen Surg consultation: Due to the lack of free air or free flow of contrast and the peritoneum there is no indication for any surgical intervention on this. Would recommend pulling back the feeding tube 1-2 cm. Would recheck an abdominal film to confirm no pneumoperitoneum in the morning. Absence of any changes okay to continue using Dobbhoff for feeding and medications. 10/28 gastrograffin study: The study confirms that the feeding tube pes perforated through the duodenum and the tip is within a cavity that fills with injected contrast. The cavity does appear walled off 11/4 refuses oral feeds, continue TF's CT reviewed: T8-T9 discitis/osteomyelitis, RT HIP FRACTURE 11/5 placed back on vasopressors, placed back on Vent due to resp acidosis, levophed turned off 11/6 afternoon 11/5 PM Trach changed out due to cuff leak, #6 Shiley cuffed, 11/6 dubhoff occluded, removed and replaced, new dubhoff shows tube in the antral stomach 11/15 refusing to take oral feeds 11/18 placed back on Vent for increased WOB,SOB. 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 10/18/2015; the patient developed a new left basal pneumonia;  with recurrent sepsis.  10/19/2015; fevers resolved with IV  acetaminophen. Tube feeds changed from vital to Jevity.  12/7 CXR with stable b/l opacities.  12/12 elevated K s/p HD-remains on AC mode 12/16-vomiting-hold feeds and re-assess in next 24 hrs 12/17- TF restarted at 1/2 goal (goal = 50cc/per) 12/18- elevated wbc, fever (101.4), recultured, restarted on ceftaz 12/19 Pericardial friction rub. Echocardiogram: LVEF 30-35%, anteroseptal hypokinesis or akinesis c/w anterior wall MI 12/20 Dr Sung AmabileSimonds discussed new echocardiogram findings with pt's husband and explained that this likely represents an anterior wall MI sometime in the previous couple of weeks. It was explained that she is not a candidate for any cardiology intervention. Code status was again discussed. Husband expressed understanding that she was not going to survive this illness in any favorable way but continues to request that she be full code status based on what he believes to be her wishes 12/21 vomitied 50cc of TF - AM TF held, restart TF at 3pm @30cc  12/26 Persistent intermittent trach cuff leak. Distal XLT trach requested.  12/27 15 beat NSVT 12/28 patient refused HD and refused Bath time 12/29 D10 infusion for hypoglycemia, TF's on hold due to intolerance 12/30 CODE BLUE  VTACH 1/1 transfuse 2 units of blood   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 Pneumoniae, P. Mirabilis, VRE Wound 9/26 >> No growth Blood 9/23 >> NEG CDiff 9/27>>neg Stool Cx 10/15>> negative Cdiff 10/25>>neg Trach Aspirate 10/28>> light growth pseudomonas Blood 10/28 >> 1/2 GPC >>  Sputum cultures obtained 09/22/15 due to mucus  plugging>>Pseudomonas BONE TISSUE Cx 11/3>>E.coli (ESBL), k. Pneumonia (daptomycin and septra) Bld Cx 11/27>>negative thus far.  Trach Asp 11/27>> grew out Pseudomonas, and Klebsiella pneumonia. Trach Asp 12/18>>PAN RESISTANT Klebsiella, Pseudomonas Bld Cx 12/18>> NEG 12/26 resp culture >> pseudomonas  ANTIMICROBIALS:  Aztreonam 9/23 >> 9/24 Vanc 9/23 >> 9/25 Vanc 9/26>>9/27 Daptomycin 9/27>> 10/12, 10/28>> off Meropenem 9/23 >> 10/14, 10/28 >> 10/31 Zosyn 11/27,>> 11/30. Levaquin 11/29>> 11/29. Ceftaz 12/1>>12/11, 12/18>> 12/24 Colisitin 12/26 >>  Tigecycline 12/26 >>  Septra 11/8>> chronic Inhaled tobramycin 12/30>>  VITAL SIGNS: Temp:  [97.6 F (36.4 C)-98.3 F (36.8 C)] 98 F (36.7 C) (01/01 0800) Pulse Rate:  [42-66] 66 (01/01 0935) Resp:  [0-21] 21 (01/01 0800) BP: (105-141)/(40-76) 124/69 mmHg (01/01 0935) SpO2:  [94 %-100 %] 94 % (01/01 0814) FiO2 (%):  [40 %] 40 % (01/01 0833) Weight:  [233 lb 11 oz (106 kg)] 233 lb 11 oz (106 kg) (01/01 0443) HEMODYNAMICS:   VENTILATOR SETTINGS: Vent Mode:  [-] PRVC FiO2 (%):  [40 %] 40 % Set Rate:  [14 bmp-22 bmp] 22 bmp Vt Set:  [530 mL] 530 mL PEEP:  [5 cmH20] 5 cmH20 Plateau Pressure:  [27 cmH20] 27 cmH20 INTAKE / OUTPUT:  Intake/Output Summary (Last 24 hours) at 11/20/15 1108 Last data filed at 11/20/15 0945  Gross per 24 hour  Intake   1220 ml  Output      0 ml  Net   1220 ml    Review of Systems  Unable to perform ROS  Unable to fully obtain, due to trach and critical illness Physical Exam  Vent dependent Somonlent HEENT WNL,  NGT present Trach site clean Diffuse rhonchi Reg with occ extrasystoles, no M Abdomen soft, + BS Ext cool, trace symmetric edema, marked muscle wasting Severe sacral decubitus ulcer-   LABS:  CBC  Recent Labs Lab 11/18/15 1205 11/19/15 0630 11/20/15 0948  WBC 31.3* 20.5* 20.7*  HGB 8.7* 7.5* 7.3*  HCT 29.0* 24.6* 24.8*  PLT 280 229 229   Coag's No results for  input(s): APTT, INR in the last 168 hours. BMET  Recent Labs Lab 11/18/15 0830 11/18/15 1810 11/19/15 0630  NA 139 137 136  K 3.7 3.2* 3.5  CL 101 101 100*  CO2 28 30 27   BUN 58* 41* 45*  CREATININE 1.29* 0.98 1.06*  GLUCOSE 109* 105* 83   Electrolytes  Recent Labs Lab 11/14/15 0855 11/18/15 0830 11/18/15 1810 11/19/15 0630  CALCIUM 8.5* 8.2* 8.1* 8.1*  MG  --  1.7 1.6* 2.1  PHOS 4.7* 4.1  --   --    Sepsis Markers No results for input(s): LATICACIDVEN, PROCALCITON, O2SATVEN in the last 168 hours. ABG No results for input(s): PHART, PCO2ART, PO2ART in the last 168 hours. Liver Enzymes  Recent Labs Lab 11/14/15 0855 11/18/15 0830  AST  --  17  ALT  --  13*  ALKPHOS  --  1309*  BILITOT  --  0.7  ALBUMIN 1.6* 1.6*   Cardiac Enzymes  Recent Labs Lab 11/18/15 1810 11/19/15 0031 11/19/15 0630  TROPONINI 0.06* 0.05* 0.13*   Glucose  Recent Labs Lab 11/18/15 1804 11/18/15 2353 11/19/15 0721 11/19/15 1107 11/20/15 0016 11/20/15 0704  GLUCAP 99 88 79 80 94 104*    Imaging No results found.  ASSESSMENT / PLAN: IMPRESSION: Very prolonged and complicated hospitalization after gastric bypass surgery 2014 Never returned home ESRD Recurrent severe sepsis - PAN resistant Klebisella in sputum Recurrent PNA-now with pseudomonas H/O C diff H/O VTE DM2 - controlled Episodic hypoglycemia Chronic steroid therapy, for a history of of adrenal insufficiency Incidental finding of R hip fx - conservative mgmt. 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 Husband declined TEE Profound deconditioning Chronic pain New finding of pericardial friction rub 12/19 New finding of cardiomyopathy, likely post MI 12/19 Atrial flutter HCAP with  MDR Klebs and pseudomonas HYPOGLYCEMIA  Cont vent support - settings reviewed and/or adjusted Cont vent bundle Daily SBT if/when meets criteria Cont HD per renal Monitor BMET intermittently Monitor I/Os Correct electrolytes as indicated DVT px: SQ heparin Monitor CBC intermittently Transfuse per usual ICU guidelines HOLD TF's for now Monitor temp, WBC count Micro and abx as above ID following - appreciate recs On oral amiodarone  for A. Flutter Recheck CXR 12/26 Recheck resp culture 12/26 ID consult 12/26 Colistin and tigecycline 12/26 per last recs of ID 12/21 Pain meds as needed D10 Infusion for hypoglycemia HGB 7.3-will transfuse 2 units of pRBCS    patient being kept alive with Vent and HD, prognosis is very poor, no meaningful recovery, will NOT change trach at this time due to near cardiac arrest  Recommend Comfort care measures, husband unreasonable. Patient with terminal medical conditions   The Patient requires high complexity decision making for assessment and support, frequent evaluation and titration of therapies, application of advanced monitoring technologies and extensive interpretation of multiple databases. Critical Care Time devoted to patient care services described in  this note is 35 minutes.   Overall, patient is critically ill, prognosis is guarded.  Patient with Multiorgan failure and at high risk for cardiac arrest and death.    Lucie Leather, M.D.  [facility]  Medical Director [facility] Medical Director [facility] Cardio-Pulmonary Department

## 2015-11-20 NOTE — Progress Notes (Signed)
Subjective:   No more v-tach.  Increased peripheral edema. Dopplers planned  Objective:  Vital signs in last 24 hours:  Temp:  [97.6 F (36.4 C)-98.3 F (36.8 C)] 98 F (36.7 C) (01/01 0800) Pulse Rate:  [42-66] 66 (01/01 0935) Resp:  [0-21] 21 (01/01 0800) BP: (77-141)/(40-76) 124/69 mmHg (01/01 0935) SpO2:  [94 %-100 %] 94 % (01/01 0814) FiO2 (%):  [40 %] 40 % (01/01 0833) Weight:  [106 kg (233 lb 11 oz)] 106 kg (233 lb 11 oz) (01/01 0443)  Weight change: 1 kg (2 lb 3.3 oz) Filed Weights   11/18/15 0459 11/19/15 0425 11/20/15 0443  Weight: 103 kg (227 lb 1.2 oz) 105 kg (231 lb 7.7 oz) 106 kg (233 lb 11 oz)    Intake/Output: I/O last 3 completed shifts: In: 2550 [I.V.:1800; IV Piggyback:750] Out: -    Intake/Output this shift:     Physical Exam: General: critically ill appearing  Head/ENT: OM moist, NG tube  Eyes: Anicteric, eyes closed  Neck: Tracheostomy in place  Lungs:  bilatearl rhonchi, vent assisted PRVC fio2 40% PEEP 5  Heart: S1S2 no rubs  Abdomen:  Soft, nontender, BS present  Extremities: 2+ dependent edema, anasarca  Neurologic: Resting comfortably at the moment  Access:  R IJ permcath.       Basic Metabolic Panel:  Recent Labs Lab 11/14/15 0855 11/18/15 0830 11/18/15 1810 11/19/15 0630  NA 141 139 137 136  K 4.8 3.7 3.2* 3.5  CL 100* 101 101 100*  CO2 31 28 30 27   GLUCOSE 87 109* 105* 83  BUN 87* 58* 41* 45*  CREATININE 1.71* 1.29* 0.98 1.06*  CALCIUM 8.5* 8.2* 8.1* 8.1*  MG  --  1.7 1.6* 2.1  PHOS 4.7* 4.1  --   --     Liver Function Tests:  Recent Labs Lab 11/14/15 0855 11/18/15 0830  AST  --  17  ALT  --  13*  ALKPHOS  --  1309*  BILITOT  --  0.7  PROT  --  5.6*  ALBUMIN 1.6* 1.6*   No results for input(s): LIPASE, AMYLASE in the last 168 hours. No results for input(s): AMMONIA in the last 168 hours.  CBC:  Recent Labs Lab 11/14/15 0512 11/14/15 0855 11/16/15 1013 11/18/15 1205 11/19/15 0630  WBC 19.9* 19.1*  34.3* 31.3* 20.5*  NEUTROABS  --   --   --   --  17.2*  HGB 7.3* 7.2* 11.0* 8.7* 7.5*  HCT 24.3* 24.1* 38.1 29.0* 24.6*  MCV 94.3 94.9 100.4* 95.0 94.5  PLT 238 242 199 280 229    Cardiac Enzymes:  Recent Labs Lab 11/18/15 1810 11/19/15 0031 11/19/15 0630  TROPONINI 0.06* 0.05* 0.13*    BNP: Invalid input(s): POCBNP  CBG:  Recent Labs Lab 11/18/15 2353 11/19/15 0721 11/19/15 1107 11/20/15 0016 11/20/15 0704  GLUCAP 88 79 80 94 104*    Microbiology: Results for orders placed or performed during the hospital encounter of 08/15/2015  Blood Culture (routine x 2)     Status: None   Collection Time: 07/22/2015  8:51 AM  Result Value Ref Range Status   Specimen Description BLOOD [name]  Final   Special Requests BOTTLES DRAWN AEROBIC AND ANAEROBIC  3CC  Final   Culture NO GROWTH 5 DAYS  Final   Report Status 08/17/2015 FINAL  Final  Blood Culture (routine x 2)     Status: None   Collection Time: 08/05/2015  9:20 AM  Result Value Ref Range  Status   Specimen Description BLOOD LEFT ARM  Final   Special Requests BOTTLES DRAWN AEROBIC AND ANAEROBIC  1CC  Final   Culture NO GROWTH 5 DAYS  Final   Report Status 08/17/2015 FINAL  Final  Wound culture     Status: None   Collection Time: 07/26/2015  9:20 AM  Result Value Ref Range Status   Specimen Description DECUBITIS  Final   Special Requests Normal  Final   Gram Stain   Final    FEW WBC SEEN MANY GRAM NEGATIVE RODS RARE GRAM POSITIVE COCCI    Culture   Final    HEAVY GROWTH ESCHERICHIA COLI MODERATE GROWTH ENTEROBACTER AEROGENES PROTEUS MIRABILIS HEAVY GROWTH ENTEROCOCCUS SPECIES VRE HAVE INTRINSIC RESISTANCE TO MOST COMMONLY USED ANTIBIOTICS AND THE ABILITY TO ACQUIRE RESISTANCE TO MOST AVAILABLE ANTIBIOTICS.    Report Status 08/16/2015 FINAL  Final   Organism ID, Bacteria ESCHERICHIA COLI  Final   Organism ID, Bacteria ENTEROBACTER AEROGENES  Final   Organism ID, Bacteria PROTEUS MIRABILIS  Final   Organism ID,  Bacteria ENTEROCOCCUS SPECIES  Final      Susceptibility   Enterobacter aerogenes - MIC*    CEFTAZIDIME <=1 SENSITIVE Sensitive     CEFAZOLIN >=64 RESISTANT Resistant     CEFTRIAXONE <=1 SENSITIVE Sensitive     CIPROFLOXACIN <=0.25 SENSITIVE Sensitive     GENTAMICIN <=1 SENSITIVE Sensitive     IMIPENEM 1 SENSITIVE Sensitive     TRIMETH/SULFA <=20 SENSITIVE Sensitive     * MODERATE GROWTH ENTEROBACTER AEROGENES   Escherichia coli - MIC*    AMPICILLIN <=2 SENSITIVE Sensitive     CEFTAZIDIME <=1 SENSITIVE Sensitive     CEFAZOLIN <=4 SENSITIVE Sensitive     CEFTRIAXONE <=1 SENSITIVE Sensitive     CIPROFLOXACIN <=0.25 SENSITIVE Sensitive     GENTAMICIN <=1 SENSITIVE Sensitive     IMIPENEM <=0.25 SENSITIVE Sensitive     TRIMETH/SULFA <=20 SENSITIVE Sensitive     * HEAVY GROWTH ESCHERICHIA COLI   Proteus mirabilis - MIC*    AMPICILLIN >=32 RESISTANT Resistant     CEFTAZIDIME <=1 SENSITIVE Sensitive     CEFAZOLIN 8 SENSITIVE Sensitive     CEFTRIAXONE <=1 SENSITIVE Sensitive     CIPROFLOXACIN <=0.25 SENSITIVE Sensitive     GENTAMICIN <=1 SENSITIVE Sensitive     IMIPENEM 1 SENSITIVE Sensitive     TRIMETH/SULFA <=20 SENSITIVE Sensitive     * PROTEUS MIRABILIS   Enterococcus species - MIC*    AMPICILLIN >=32 RESISTANT Resistant     VANCOMYCIN >=32 RESISTANT Resistant     GENTAMICIN SYNERGY SENSITIVE Sensitive     TETRACYCLINE Value in next row Resistant      RESISTANT>=16    * HEAVY GROWTH ENTEROCOCCUS SPECIES  MRSA PCR Screening     Status: None   Collection Time: 07/25/2015  2:38 PM  Result Value Ref Range Status   MRSA by PCR NEGATIVE NEGATIVE Final    Comment:        The GeneXpert MRSA Assay (FDA approved for NASAL specimens only), is one component of a comprehensive MRSA colonization surveillance program. It is not intended to diagnose MRSA infection nor to guide or monitor treatment for MRSA infections.   Blood culture (single)     Status: None   Collection Time:  07/23/2015  3:36 PM  Result Value Ref Range Status   Specimen Description BLOOD RIGHT ASSIST CONTROL  Final   Special Requests BOTTLES DRAWN AEROBIC AND ANAEROBIC  Final  Culture NO GROWTH 5 DAYS  Final   Report Status 08/17/2015 FINAL  Final  Wound culture     Status: None   Collection Time: 08/13/15 12:37 PM  Result Value Ref Range Status   Specimen Description WOUND  Final   Special Requests Normal  Final   Gram Stain   Final    FEW WBC SEEN TOO NUMEROUS TO COUNT GRAM NEGATIVE RODS FEW GRAM POSITIVE COCCI    Culture   Final    HEAVY GROWTH ESCHERICHIA COLI MODERATE GROWTH PROTEUS MIRABILIS LIGHT GROWTH KLEBSIELLA PNEUMONIAE MODERATE GROWTH ENTEROCOCCUS GALLINARUM CRITICAL RESULT CALLED TO, READ BACK BY AND VERIFIED WITH: [facility] LtdANDRA BORBA AT 1042 08/16/15 DV    Report Status 08/17/2015 FINAL  Final   Organism ID, Bacteria ESCHERICHIA COLI  Final   Organism ID, Bacteria PROTEUS MIRABILIS  Final   Organism ID, Bacteria KLEBSIELLA PNEUMONIAE  Final   Organism ID, Bacteria ENTEROCOCCUS GALLINARUM  Final      Susceptibility   Escherichia coli - MIC*    AMPICILLIN >=32 RESISTANT Resistant     CEFTAZIDIME 4 RESISTANT Resistant     CEFAZOLIN >=64 RESISTANT Resistant     CEFTRIAXONE 16 RESISTANT Resistant     GENTAMICIN 2 SENSITIVE Sensitive     IMIPENEM >=16 RESISTANT Resistant     TRIMETH/SULFA <=20 SENSITIVE Sensitive     Extended ESBL POSITIVE Resistant     PIP/TAZO Value in next row Resistant      RESISTANT>=128    CIPROFLOXACIN Value in next row Sensitive      SENSITIVE<=0.25    * HEAVY GROWTH ESCHERICHIA COLI   Klebsiella pneumoniae - MIC*    AMPICILLIN Value in next row Resistant      SENSITIVE<=0.25    CEFTAZIDIME Value in next row Resistant      SENSITIVE<=0.25    CEFAZOLIN Value in next row Resistant      SENSITIVE<=0.25    CEFTRIAXONE Value in next row Resistant      SENSITIVE<=0.25    CIPROFLOXACIN Value in next row Resistant      SENSITIVE<=0.25     GENTAMICIN Value in next row Sensitive      SENSITIVE<=0.25    IMIPENEM Value in next row Resistant      SENSITIVE<=0.25    TRIMETH/SULFA Value in next row Resistant      SENSITIVE<=0.25    PIP/TAZO Value in next row Resistant      RESISTANT>=128    * LIGHT GROWTH KLEBSIELLA PNEUMONIAE   Proteus mirabilis - MIC*    AMPICILLIN Value in next row Resistant      RESISTANT>=128    CEFTAZIDIME Value in next row Sensitive      RESISTANT>=128    CEFAZOLIN Value in next row Sensitive      RESISTANT>=128    CEFTRIAXONE Value in next row Sensitive      RESISTANT>=128    CIPROFLOXACIN Value in next row Sensitive      RESISTANT>=128    GENTAMICIN Value in next row Sensitive      RESISTANT>=128    IMIPENEM Value in next row Sensitive      RESISTANT>=128    TRIMETH/SULFA Value in next row Sensitive      RESISTANT>=128    PIP/TAZO Value in next row Sensitive      SENSITIVE<=4    * MODERATE GROWTH PROTEUS MIRABILIS   Enterococcus gallinarum - MIC*    AMPICILLIN Value in next row Resistant      SENSITIVE<=4    GENTAMICIN  SYNERGY Value in next row Sensitive      SENSITIVE<=4    CIPROFLOXACIN Value in next row Resistant      RESISTANT>=8    TETRACYCLINE Value in next row Resistant      RESISTANT>=16    * MODERATE GROWTH ENTEROCOCCUS GALLINARUM  Wound culture     Status: None   Collection Time: 08/15/15  2:53 PM  Result Value Ref Range Status   Specimen Description WOUND  Final   Special Requests NONE  Final   Gram Stain FEW WBC SEEN NO ORGANISMS SEEN   Final   Culture NO GROWTH 3 DAYS  Final   Report Status 08/18/2015 FINAL  Final  C difficile quick scan w PCR reflex     Status: None   Collection Time: 08/17/15 11:34 AM  Result Value Ref Range Status   C Diff antigen NEGATIVE NEGATIVE Final   C Diff toxin NEGATIVE NEGATIVE Final   C Diff interpretation Negative for C. difficile  Final  Stool culture     Status: None   Collection Time: 09/03/15  3:51 PM  Result Value Ref Range  Status   Specimen Description STOOL  Final   Special Requests Immunocompromised  Final   Culture   Final    NO SALMONELLA OR SHIGELLA ISOLATED No Pathogenic E. coli detected NO CAMPYLOBACTER DETECTED    Report Status 09/07/2015 FINAL  Final  C difficile quick scan w PCR reflex     Status: None   Collection Time: 09/13/15 12:51 AM  Result Value Ref Range Status   C Diff antigen NEGATIVE NEGATIVE Final   C Diff toxin NEGATIVE NEGATIVE Final   C Diff interpretation Negative for C. difficile  Final  Culture, blood (routine x 2)     Status: None   Collection Time: 09/16/15 11:24 AM  Result Value Ref Range Status   Specimen Description BLOOD LEFT HAND  Final   Special Requests BOTTLES DRAWN AEROBIC AND ANAEROBIC  1CC  Final   Culture NO GROWTH 5 DAYS  Final   Report Status 09/21/2015 FINAL  Final  Culture, blood (routine x 2)     Status: None   Collection Time: 09/16/15 12:23 PM  Result Value Ref Range Status   Specimen Description BLOOD RIGHT HAND  Final   Special Requests BOTTLES DRAWN AEROBIC AND ANAEROBIC  1CC  Final   Culture  Setup Time   Final    GRAM POSITIVE COCCI AEROBIC BOTTLE ONLY CRITICAL RESULT CALLED TO, READ BACK BY AND VERIFIED WITH: TESS THOMAS,RN 09/17/2015 0631 BY JRS.    Culture   Final    ENTEROCOCCUS FAECALIS AEROBIC BOTTLE ONLY VRE HAVE INTRINSIC RESISTANCE TO MOST COMMONLY USED ANTIBIOTICS AND THE ABILITY TO ACQUIRE RESISTANCE TO MOST AVAILABLE ANTIBIOTICS. CRITICAL RESULT CALLED TO, READ BACK BY AND VERIFIED WITH: CHERYL SMITH AT 1610 09/19/15 DV    Report Status 09/21/2015 FINAL  Final   Organism ID, Bacteria ENTEROCOCCUS FAECALIS  Final      Susceptibility   Enterococcus faecalis - MIC*    AMPICILLIN <=2 SENSITIVE Sensitive     LINEZOLID 2 SENSITIVE Sensitive     CIPROFLOXACIN Value in next row Resistant      RESISTANT>=8    TETRACYCLINE Value in next row Resistant      RESISTANT>=16    VANCOMYCIN Value in next row Resistant      RESISTANT>=32     GENTAMICIN SYNERGY Value in next row Resistant      RESISTANT>=32    *  ENTEROCOCCUS FAECALIS  Culture, respiratory (NON-Expectorated)     Status: None   Collection Time: 09/16/15  3:50 PM  Result Value Ref Range Status   Specimen Description TRACHEAL ASPIRATE  Final   Special Requests Immunocompromised  Final   Gram Stain   Final    FEW WBC SEEN GOOD SPECIMEN - 80-90% WBCS RARE GRAM NEGATIVE RODS    Culture LIGHT GROWTH PSEUDOMONAS AERUGINOSA  Final   Report Status 09/19/2015 FINAL  Final   Organism ID, Bacteria PSEUDOMONAS AERUGINOSA  Final      Susceptibility   Pseudomonas aeruginosa - MIC*    CEFTAZIDIME 8 SENSITIVE Sensitive     CIPROFLOXACIN 2 INTERMEDIATE Intermediate     GENTAMICIN >=16 RESISTANT Resistant     IMIPENEM >=16 RESISTANT Resistant     PIP/TAZO Value in next row Sensitive      SENSITIVE32    CEFEPIME Value in next row Sensitive      SENSITIVE8    LEVOFLOXACIN Value in next row Resistant      RESISTANT>=8    * LIGHT GROWTH PSEUDOMONAS AERUGINOSA  Culture, expectorated sputum-assessment     Status: None   Collection Time: 09/22/15  2:09 PM  Result Value Ref Range Status   Specimen Description ENDOTRACHEAL  Final   Special Requests Normal  Final   Sputum evaluation THIS SPECIMEN IS ACCEPTABLE FOR SPUTUM CULTURE  Final   Report Status 09/24/2015 FINAL  Final  Culture, respiratory (NON-Expectorated)     Status: None   Collection Time: 09/22/15  2:09 PM  Result Value Ref Range Status   Specimen Description ENDOTRACHEAL  Final   Special Requests Normal Reflexed from [id number]  Final   Gram Stain   Final    FEW WBC SEEN FEW GRAM NEGATIVE RODS POOR SPECIMEN - LESS THAN 70% WBCS    Culture   Final    MODERATE GROWTH PSEUDOMONAS AERUGINOSA LIGHT GROWTH KLEBSIELLA PNEUMONIAE REFER TO SENSITIVITIES FROM PREVIOUS CULTURE FOR ORG 2    Report Status 10/01/2015 FINAL  Final   Organism ID, Bacteria PSEUDOMONAS AERUGINOSA  Final   Organism ID, Bacteria KLEBSIELLA  PNEUMONIAE  Final      Susceptibility   Pseudomonas aeruginosa - MIC*    CEFTAZIDIME 4 SENSITIVE Sensitive     CIPROFLOXACIN 2 INTERMEDIATE Intermediate     GENTAMICIN >=16 RESISTANT Resistant     IMIPENEM >=16 RESISTANT Resistant     PIP/TAZO Value in next row Sensitive      SENSITIVE16    * MODERATE GROWTH PSEUDOMONAS AERUGINOSA  Tissue culture     Status: None   Collection Time: 09/24/15  6:44 AM  Result Value Ref Range Status   Specimen Description BONE  Final   Special Requests Normal  Final   Gram Stain MODERATE WBC SEEN FEW GRAM NEGATIVE RODS   Final   Culture   Final    MODERATE GROWTH ESCHERICHIA COLI MODERATE GROWTH KLEBSIELLA PNEUMONIAE ESBL-EXTENDED SPECTRUM BETA LACTAMASE-THE ORGANISM IS RESISTANT TO PENICILLINS, CEPHALOSPORINS AND AZTREONAM ACCORDING TO CLSI M100-S15 VOL.25 N01 JAN 2005. ORGANISM 1 This organism isolate is resistant to one or more antiotic agents in three or more antimicrobial categories.  Suggest Infectious Disease consult.   ORGANISM 2 CRITICAL RESULT CALLED TO, READ BACK BY AND VERIFIED WITH: RN Mardene Celeste LINDSAY 09/27/15 1005AM    Report Status 09/28/2015 FINAL  Final   Organism ID, Bacteria ESCHERICHIA COLI  Final   Organism ID, Bacteria KLEBSIELLA PNEUMONIAE  Final      Susceptibility   Escherichia  coli - MIC*    AMPICILLIN >=32 RESISTANT Resistant     CEFTAZIDIME 4 RESISTANT Resistant     CEFAZOLIN >=64 RESISTANT Resistant     CEFTRIAXONE 8 RESISTANT Resistant     GENTAMICIN <=1 SENSITIVE Sensitive     IMIPENEM 8 RESISTANT Resistant     TRIMETH/SULFA <=20 SENSITIVE Sensitive     Extended ESBL POSITIVE Resistant     PIP/TAZO Value in next row Resistant      RESISTANT>=128    * MODERATE GROWTH ESCHERICHIA COLI   Klebsiella pneumoniae - MIC*    AMPICILLIN Value in next row Resistant      RESISTANT>=128    CEFTAZIDIME Value in next row Resistant      RESISTANT>=128    CEFAZOLIN Value in next row Resistant      RESISTANT>=128    CEFTRIAXONE  Value in next row Resistant      RESISTANT>=128    CIPROFLOXACIN Value in next row Resistant      RESISTANT>=128    GENTAMICIN Value in next row Resistant      RESISTANT>=128    IMIPENEM Value in next row Resistant      RESISTANT>=128    TRIMETH/SULFA Value in next row Sensitive      RESISTANT>=128    PIP/TAZO Value in next row Resistant      RESISTANT>=128    * MODERATE GROWTH KLEBSIELLA PNEUMONIAE  Anaerobic culture     Status: None   Collection Time: 09/24/15  3:55 PM  Result Value Ref Range Status   Specimen Description BONE  Final   Special Requests Normal  Final   Culture NO ANAEROBES ISOLATED  Final   Report Status 09/29/2015 FINAL  Final  Culture, fungus without smear     Status: None   Collection Time: 09/24/15  3:55 PM  Result Value Ref Range Status   Specimen Description BONE  Final   Special Requests Normal  Final   Culture NO FUNGUS ISOLATED AFTER 24 DAYS  Final   Report Status 10/18/2015 FINAL  Final  C difficile quick scan w PCR reflex     Status: None   Collection Time: 10/07/15  2:02 PM  Result Value Ref Range Status   C Diff antigen NEGATIVE NEGATIVE Final   C Diff toxin NEGATIVE NEGATIVE Final   C Diff interpretation Negative for C. difficile  Final  Culture, blood (routine x 2)     Status: None   Collection Time: 10/16/15  9:52 AM  Result Value Ref Range Status   Specimen Description BLOOD LEFT HAND  Final   Special Requests   Final    BOTTLES DRAWN AEROBIC AND ANAEROBIC  AER 6CC ANA 4CC   Culture NO GROWTH 6 DAYS  Final   Report Status 10/22/2015 FINAL  Final  Culture, blood (routine x 2)     Status: None   Collection Time: 10/16/15 12:25 PM  Result Value Ref Range Status   Specimen Description BLOOD LEFT HAND  Final   Special Requests BOTTLES DRAWN AEROBIC AND ANAEROBIC  5CC  Final   Culture NO GROWTH 6 DAYS  Final   Report Status 10/22/2015 FINAL  Final  Culture, expectorated sputum-assessment     Status: None   Collection Time: 10/18/15  1:15  PM  Result Value Ref Range Status   Specimen Description SPUTUM  Final   Special Requests NONE  Final   Sputum evaluation THIS SPECIMEN IS ACCEPTABLE FOR SPUTUM CULTURE  Final   Report Status 10/18/2015 FINAL  Final  Culture, respiratory (NON-Expectorated)     Status: None   Collection Time: 10/18/15  1:15 PM  Result Value Ref Range Status   Specimen Description SPUTUM  Final   Special Requests NONE Reflexed from [id number]  Final   Gram Stain   Final    GOOD SPECIMEN - 80-90% WBCS MODERATE WBC SEEN MANY GRAM NEGATIVE RODS    Culture   Final    HEAVY GROWTH PSEUDOMONAS AERUGINOSA MODERATE GROWTH KLEBSIELLA PNEUMONIAE CRITICAL RESULT CALLED TO, READ BACK BY AND VERIFIED WITHRod Mae AT 1610 10/21/15 DV KLEBSIELLA PNEUMONIAE This organism isolate is resistant to one or more antiotic agents in three or more antimicrobial categories.  Suggest Infectious Disease  consult.      Report Status 10/22/2015 FINAL  Final   Organism ID, Bacteria PSEUDOMONAS AERUGINOSA  Final   Organism ID, Bacteria KLEBSIELLA PNEUMONIAE  Final      Susceptibility   Klebsiella pneumoniae - MIC*    AMPICILLIN >=32 RESISTANT Resistant     CEFAZOLIN >=64 RESISTANT Resistant     CEFTRIAXONE >=64 RESISTANT Resistant     CIPROFLOXACIN >=4 RESISTANT Resistant     GENTAMICIN >=16 RESISTANT Resistant     IMIPENEM >=16 RESISTANT Resistant     NITROFURANTOIN >=512 RESISTANT Resistant     TRIMETH/SULFA <=20 SENSITIVE Sensitive     * MODERATE GROWTH KLEBSIELLA PNEUMONIAE   Pseudomonas aeruginosa - MIC*    CEFTAZIDIME 4 SENSITIVE Sensitive     CIPROFLOXACIN >=4 RESISTANT Resistant     GENTAMICIN 8 INTERMEDIATE Intermediate     IMIPENEM >=16 RESISTANT Resistant     PIP/TAZO Value in next row Sensitive      SENSITIVE32    * HEAVY GROWTH PSEUDOMONAS AERUGINOSA  C difficile quick scan w PCR reflex     Status: None   Collection Time: 10/18/15  4:39 PM  Result Value Ref Range Status   C Diff antigen NEGATIVE NEGATIVE  Final   C Diff toxin NEGATIVE NEGATIVE Final   C Diff interpretation Negative for C. difficile  Final  Culture, blood (Routine X 2) w Reflex to ID Panel     Status: None   Collection Time: 11/06/15  8:27 AM  Result Value Ref Range Status   Specimen Description BLOOD RIGHT HAND  Final   Special Requests BOTTLES DRAWN AEROBIC AND ANAEROBIC 3CC  Final   Culture NO GROWTH 5 DAYS  Final   Report Status 11/11/2015 FINAL  Final  Culture, blood (Routine X 2) w Reflex to ID Panel     Status: None   Collection Time: 11/06/15  8:29 AM  Result Value Ref Range Status   Specimen Description BLOOD LEFT HAND  Final   Special Requests BOTTLES DRAWN AEROBIC AND ANAEROBIC  1CC  Final   Culture NO GROWTH 5 DAYS  Final   Report Status 11/11/2015 FINAL  Final  Culture, expectorated sputum-assessment     Status: None   Collection Time: 11/06/15  8:41 AM  Result Value Ref Range Status   Specimen Description EXPECTORATED SPUTUM  Final   Special Requests Immunocompromised  Final   Sputum evaluation THIS SPECIMEN IS ACCEPTABLE FOR SPUTUM CULTURE  Final   Report Status 11/06/2015 FINAL  Final  Culture, respiratory (NON-Expectorated)     Status: None   Collection Time: 11/06/15  8:41 AM  Result Value Ref Range Status   Specimen Description EXPECTORATED SPUTUM  Final   Special Requests Immunocompromised Reflexed from [id number]  Final   Gram Stain  Final    MANY WBC SEEN MANY GRAM NEGATIVE RODS EXCELLENT SPECIMEN - 90-100% WBCS    Culture   Final    MODERATE GROWTH KLEBSIELLA PNEUMONIAE MODERATE GROWTH PSEUDOMONAS AERUGINOSA CRITICAL RESULT CALLED TO, READ BACK BY AND VERIFIED WITH: DR. FITZGERALD AT 1120 11/09/15 DV DR. FITZGERALD REQUESTED ORGANISM BE SENT OUT FOR TIGECYCLINE CEFT/AVIBACTAM AND COLISTIN Sent to [facility] for further susceptibility testing. 11/11/15 CTJ    Report Status 11/15/2015 FINAL  Final   Organism ID, Bacteria KLEBSIELLA PNEUMONIAE  Final   Organism ID, Bacteria PSEUDOMONAS  AERUGINOSA  Final      Susceptibility   Klebsiella pneumoniae - MIC*    AMPICILLIN >=32 RESISTANT Resistant     CEFAZOLIN >=64 RESISTANT Resistant     CEFTRIAXONE 32 RESISTANT Resistant     CIPROFLOXACIN >=4 RESISTANT Resistant     GENTAMICIN >=16 RESISTANT Resistant     IMIPENEM >=16 RESISTANT Resistant     NITROFURANTOIN 256 RESISTANT Resistant     TRIMETH/SULFA >=320 RESISTANT Resistant     Extended ESBL POSITIVE Resistant     * MODERATE GROWTH KLEBSIELLA PNEUMONIAE   Pseudomonas aeruginosa - MIC*    CEFTAZIDIME >=64 RESISTANT Resistant     CIPROFLOXACIN 2 INTERMEDIATE Intermediate     GENTAMICIN >=16 RESISTANT Resistant     IMIPENEM >=16 RESISTANT Resistant     * MODERATE GROWTH PSEUDOMONAS AERUGINOSA  Culture, respiratory (NON-Expectorated)     Status: None   Collection Time: 11/14/15  2:07 PM  Result Value Ref Range Status   Specimen Description TRACHEAL ASPIRATE  Final   Special Requests NONE  Final   Gram Stain   Final    EXCELLENT SPECIMEN - 90-100% WBCS MANY WBC SEEN MODERATE GRAM NEGATIVE RODS    Culture   Final    HEAVY GROWTH PSEUDOMONAS AERUGINOSA CRITICAL VALUE NOTED.  VALUE IS CONSISTENT WITH PREVIOUSLY REPORTED AND CALLED VALUE.    Report Status 11/16/2015 FINAL  Final   Organism ID, Bacteria PSEUDOMONAS AERUGINOSA  Final      Susceptibility   Pseudomonas aeruginosa - MIC*    CEFTAZIDIME >=64 RESISTANT Resistant     CIPROFLOXACIN 2 INTERMEDIATE Intermediate     GENTAMICIN >=16 RESISTANT Resistant     IMIPENEM >=16 RESISTANT Resistant     PIP/TAZO Value in next row Resistant      RESISTANT>=128    TOBRAMYCIN Value in next row Sensitive      SENSITIVE2    * HEAVY GROWTH PSEUDOMONAS AERUGINOSA    Coagulation Studies: No results for input(s): LABPROT, INR in the last 72 hours.  Urinalysis: No results for input(s): COLORURINE, LABSPEC, PHURINE, GLUCOSEU, HGBUR, BILIRUBINUR, KETONESUR, PROTEINUR, UROBILINOGEN, NITRITE, LEUKOCYTESUR in the last 72  hours.  Invalid input(s): APPERANCEUR    Imaging: No results found.   Medications:   . dextrose 50 mL/hr at 11/20/15 0700  . feeding supplement (JEVITY 1.5 CAL/FIBER)    . norepinephrine (LEVOPHED) Adult infusion Stopped (11/19/15 1043)   . amiodarone  200 mg Per Tube BID   Followed by  . [START ON 11/25/2015] amiodarone  200 mg Per Tube Daily  . antiseptic oral rinse  7 mL Mouth Rinse QID  . aspirin  81 mg Oral Daily  . chlorhexidine gluconate  15 mL Mouth Rinse BID  . colistimethate (COLISTIN) IV  250 mg Intravenous Q48H  . epoetin (EPOGEN/PROCRIT) injection  10,000 Units Intravenous Q M,W,F-HD  . escitalopram  10 mg Oral Daily  . feeding supplement (PRO-STAT SUGAR FREE 64)  30 mL  Oral 6 X Daily  . free water  20 mL Per Tube 6 times per day  . heparin subcutaneous  5,000 Units Subcutaneous Q12H  . hydrocortisone  10 mg Per Tube BID  . lidocaine  1 patch Transdermal Q24H  . metoprolol tartrate  12.5 mg Oral BID  . midodrine  10 mg Per Tube TID WC  . mirtazapine  7.5 mg Oral QHS  . multivitamin  5 mL Oral Daily  . pantoprazole sodium  40 mg Per Tube Daily  . saccharomyces boulardii  250 mg Oral BID  . sodium chloride  10-40 mL Intracatheter Q12H  . sodium hypochlorite   Irrigation BID  . tigecycline (TYGACIL) IVPB  50 mg Intravenous Q12H  . tobramycin (PF)  300 mg Nebulization BID   acetaminophen (TYLENOL) oral liquid 160 mg/5 mL, HYDROmorphone (DILAUDID) injection, ipratropium-albuterol, loperamide, morphine injection, ondansetron (ZOFRAN) IV, oxyCODONE, polyvinyl alcohol, sodium chloride, zinc oxide  Assessment/ Plan:  50 y.o. 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 Admitted on 08/09/2015  Echo: 11/07/19: EF of 30% to 35%.   1. End-stage renal disease on hemodialysis on HD MWF. The patient has been on dialysis since October of 2014. R IJ permcath.  - Dialysis treatment with IV albumin for oncotic support.  - Midodrine and hydrocortisone ordered for blood pressure support.  - Continue MWF schedule, monitor daily for dailysis treatment. Next treatment for tomorrow. Will attempt more UF.   2. Anemia of CKD. Hemoglobin 7.5, unclear where reading of 11 came from  - Epogen and periodic blood transfusions.   3. Acute resp failure: ventilator dependent -prolonged course on ventilator, fio2 currently 40% with PEEP of 5.   4. Sepsis: pseudomonas in tracheal aspirate - tobramycin inhaled and tigecycline.   5. Secondary Hyperparathyroidism: PTH 50, phos 4.1 - not currently on a binder or vitamin D agent.     LOS: 100 [employer] 1/1/20179:44 AM

## 2015-11-20 NOTE — Progress Notes (Signed)
MEDICATION RELATED CONSULT NOTE - Follow up   Pharmacy Consult for Renal dosing  Allergies  Allergen Reactions  . Contrast Media [Iodinated Diagnostic Agents] Anaphylaxis  . Ampicillin Rash   Patient Measurements: Ht 405ft 9in Height:  (SCALE BROKE) Weight: 233 lb 11 oz (106 kg) IBW/kg (Calculated) : 66.2  Vital Signs: Temp: 98 F (36.7 C) (01/01 0800) Temp Source: Axillary (01/01 0800) BP: 113/67 mmHg (01/01 0800) Pulse Rate: 63 (01/01 0800)  Recent Labs  11/18/15 0830 11/18/15 1205 11/18/15 1810 11/19/15 0630  WBC  --  31.3*  --  20.5*  HGB  --  8.7*  --  7.5*  HCT  --  29.0*  --  24.6*  PLT  --  280  --  229  CREATININE 1.29*  --  0.98 1.06*  MG 1.7  --  1.6* 2.1  PHOS 4.1  --   --   --   ALBUMIN 1.6*  --   --   --   PROT 5.6*  --   --   --   AST 17  --   --   --   ALT 13*  --   --   --   ALKPHOS 1309*  --   --   --   BILITOT 0.7  --   --   --    Estimated Creatinine Clearance: 82.3 mL/min (by C-G formula based on Cr of 1.06).  Assessment: 50 yo patient on HD. Pharmacy consulted for renal dosing of medications.   Plan:  No medications require adjustment at present. Pharmacy will continue to monitor and dose adjust as needed.    Demetrius Charityeldrin D. Kaulder Zahner, PharmD   11/20/2015

## 2015-11-20 NOTE — Progress Notes (Signed)
Pt has ordered 2 units of PRBC.  One completed by day shift nurse Nehemiah SettleBrooke, rn.  Completion time at 19:04.

## 2015-11-21 ENCOUNTER — Inpatient Hospital Stay: Payer: 59

## 2015-11-21 DIAGNOSIS — J9601 Acute respiratory failure with hypoxia: Secondary | ICD-10-CM | POA: Diagnosis not present

## 2015-11-21 LAB — CBC
HCT: 32.1 % — ABNORMAL LOW (ref 35.0–47.0)
Hemoglobin: 10.1 g/dL — ABNORMAL LOW (ref 12.0–16.0)
MCH: 28.8 pg (ref 26.0–34.0)
MCHC: 31.6 g/dL — ABNORMAL LOW (ref 32.0–36.0)
MCV: 91.2 fL (ref 80.0–100.0)
PLATELETS: 170 10*3/uL (ref 150–440)
RBC: 3.52 MIL/uL — AB (ref 3.80–5.20)
RDW: 19.5 % — ABNORMAL HIGH (ref 11.5–14.5)
WBC: 24 10*3/uL — AB (ref 3.6–11.0)

## 2015-11-21 LAB — BRAIN NATRIURETIC PEPTIDE: B Natriuretic Peptide: >4500 pg/mL — ABNORMAL HIGH (ref 0.0–100.0)

## 2015-11-21 LAB — CBC WITH DIFFERENTIAL/PLATELET
BASOS ABS: 0.1 10*3/uL (ref 0–0.1)
Basophils Relative: 0 %
EOS ABS: 0.1 10*3/uL (ref 0–0.7)
HCT: 30.8 % — ABNORMAL LOW (ref 35.0–47.0)
Hemoglobin: 9.7 g/dL — ABNORMAL LOW (ref 12.0–16.0)
Lymphs Abs: 1.4 10*3/uL (ref 1.0–3.6)
MCH: 29 pg (ref 26.0–34.0)
MCHC: 31.7 g/dL — ABNORMAL LOW (ref 32.0–36.0)
MCV: 91.5 fL (ref 80.0–100.0)
Monocytes Absolute: 1.2 10*3/uL — ABNORMAL HIGH (ref 0.2–0.9)
Monocytes Relative: 6 %
NEUTROS ABS: 17.1 10*3/uL — AB (ref 1.4–6.5)
PLATELETS: 186 10*3/uL (ref 150–440)
RBC: 3.36 MIL/uL — AB (ref 3.80–5.20)
RDW: 19.5 % — ABNORMAL HIGH (ref 11.5–14.5)
WBC: 19.9 10*3/uL — AB (ref 3.6–11.0)

## 2015-11-21 LAB — APTT: APTT: 41 s — AB (ref 24–36)

## 2015-11-21 LAB — BLOOD GAS, ARTERIAL
ALLENS TEST (PASS/FAIL): POSITIVE — AB
Acid-Base Excess: 4.4 mmol/L — ABNORMAL HIGH (ref 0.0–3.0)
Bicarbonate: 29.8 mEq/L — ABNORMAL HIGH (ref 21.0–28.0)
FIO2: 1
MECHANICAL RATE: 14
O2 Saturation: 95.5 %
PATIENT TEMPERATURE: 37
PCO2 ART: 47 mmHg (ref 32.0–48.0)
PEEP: 8 cmH2O
PO2 ART: 78 mmHg — AB (ref 83.0–108.0)
RATE: 14 resp/min
VT: 530 mL
pH, Arterial: 7.41 (ref 7.350–7.450)

## 2015-11-21 LAB — TROPONIN I: TROPONIN I: 0.05 ng/mL — AB (ref <0.031)

## 2015-11-21 LAB — BASIC METABOLIC PANEL
ANION GAP: 7 (ref 5–15)
Anion gap: 8 (ref 5–15)
BUN: 67 mg/dL — ABNORMAL HIGH (ref 6–20)
BUN: 72 mg/dL — ABNORMAL HIGH (ref 6–20)
CALCIUM: 8 mg/dL — AB (ref 8.9–10.3)
CALCIUM: 8 mg/dL — AB (ref 8.9–10.3)
CHLORIDE: 97 mmol/L — AB (ref 101–111)
CO2: 28 mmol/L (ref 22–32)
CO2: 28 mmol/L (ref 22–32)
CREATININE: 1.25 mg/dL — AB (ref 0.44–1.00)
CREATININE: 1.46 mg/dL — AB (ref 0.44–1.00)
Chloride: 96 mmol/L — ABNORMAL LOW (ref 101–111)
GFR calc Af Amer: 47 mL/min — ABNORMAL LOW (ref >60)
GFR calc non Af Amer: 49 mL/min — ABNORMAL LOW (ref >60)
GFR, EST AFRICAN AMERICAN: 57 mL/min — AB (ref >60)
GFR, EST NON AFRICAN AMERICAN: 41 mL/min — AB (ref >60)
GLUCOSE: 133 mg/dL — AB (ref 65–99)
GLUCOSE: 117 mg/dL — AB (ref 65–99)
Potassium: 3.6 mmol/L (ref 3.5–5.1)
Potassium: 3.6 mmol/L (ref 3.5–5.1)
Sodium: 133 mmol/L — ABNORMAL LOW (ref 135–145)
Sodium: 131 mmol/L — ABNORMAL LOW (ref 135–145)

## 2015-11-21 LAB — CREATININE, SERUM
CREATININE: 1.42 mg/dL — AB (ref 0.44–1.00)
GFR calc Af Amer: 49 mL/min — ABNORMAL LOW (ref >60)
GFR calc non Af Amer: 42 mL/min — ABNORMAL LOW (ref >60)

## 2015-11-21 LAB — GLUCOSE, CAPILLARY
GLUCOSE-CAPILLARY: 109 mg/dL — AB (ref 65–99)
GLUCOSE-CAPILLARY: 99 mg/dL (ref 65–99)
Glucose-Capillary: 115 mg/dL — ABNORMAL HIGH (ref 65–99)
Glucose-Capillary: 104 mg/dL — ABNORMAL HIGH (ref 65–99)

## 2015-11-21 LAB — MAGNESIUM
Magnesium: 2.1 mg/dL (ref 1.7–2.4)
Magnesium: 2.1 mg/dL (ref 1.7–2.4)

## 2015-11-21 LAB — PROTIME-INR
INR: 1.57
PROTHROMBIN TIME: 18.8 s — AB (ref 11.4–15.0)

## 2015-11-21 MED ORDER — HEPARIN (PORCINE) IN NACL 100-0.45 UNIT/ML-% IJ SOLN
1700.0000 [IU]/h | INTRAMUSCULAR | Status: DC
  Administered 2015-11-21: 1450 [IU]/h via INTRAVENOUS
  Administered 2015-11-22: 1700 [IU]/h via INTRAVENOUS
  Filled 2015-11-21 (×2): qty 250

## 2015-11-21 MED ORDER — ALBUMIN HUMAN 25 % IV SOLN
12.5000 g | Freq: Once | INTRAVENOUS | Status: DC
  Filled 2015-11-21: qty 50

## 2015-11-21 NOTE — Progress Notes (Signed)
Second unit of blood completed at 23:40.  Pt showed no signs of reaction.

## 2015-11-21 NOTE — Progress Notes (Signed)
ANTICOAGULATION CONSULT NOTE - Initial Consult  Pharmacy Consult for Heparin Drip Indication: pulmonary embolus  Allergies  Allergen Reactions  . Contrast Media [Iodinated Diagnostic Agents] Anaphylaxis  . Ampicillin Rash    Patient Measurements: Height:  (SCALE BROKE) Weight: 235 lb 14.3 oz (107 kg) IBW/kg (Calculated) : 66.2 Heparin Dosing Weight: 90 kg  Vital Signs: Temp: 97.4 F (36.3 C) (01/02 1900) Temp Source: Axillary (01/02 1900) BP: 116/82 mmHg (01/02 1900) Pulse Rate: 64 (01/02 1900)  Labs:  Recent Labs  11/19/15 0031  11/19/15 0630 11/20/15 0948 11/21/15 0147 11/21/15 0434 11/21/15 1026 11/21/15 1318  HGB  --   < > 7.5* 7.3*  --   --  9.7*  --   HCT  --   --  24.6* 24.8*  --   --  30.8*  --   PLT  --   --  229 229  --   --  186  --   CREATININE  --   < > 1.06*  --  1.25* 1.42*  --  1.46*  TROPONINI 0.05*  --  0.13*  --   --   --   --   --   < > = values in this interval not displayed.  Estimated Creatinine Clearance: 60 mL/min (by C-G formula based on Cr of 1.46).   Medical History: Past Medical History  Diagnosis Date  . Obesity   . Dyslipidemia   . Hypertension   . Coronary artery disease     s/p BMS 2010 LAD  . Dysrhythmia     ventricular tachycardia resolved after LAD stent and beta blocker  . Diabetes mellitus     with retinopathy, neuropathy and microalbuminemia  . ESRD (end stage renal disease) on dialysis     Medications:  Scheduled:  . albumin human  12.5 g Intravenous Once  . amiodarone  200 mg Per Tube BID   Followed by  . [START ON 11/25/2015] amiodarone  200 mg Per Tube Daily  . antiseptic oral rinse  7 mL Mouth Rinse QID  . aspirin  81 mg Oral Daily  . chlorhexidine gluconate  15 mL Mouth Rinse BID  . colistimethate (COLISTIN) IV  250 mg Intravenous Q48H  . epoetin (EPOGEN/PROCRIT) injection  10,000 Units Intravenous Q M,W,F-HD  . escitalopram  10 mg Oral Daily  . feeding supplement (PRO-STAT SUGAR FREE 64)  30 mL Oral 6  X Daily  . free water  20 mL Per Tube 6 times per day  . hydrocortisone  10 mg Per Tube BID  . lidocaine  1 patch Transdermal Q24H  . metoprolol tartrate  12.5 mg Oral BID  . midodrine  10 mg Per Tube TID WC  . mirtazapine  7.5 mg Oral QHS  . multivitamin  5 mL Oral Daily  . pantoprazole sodium  40 mg Per Tube Daily  . saccharomyces boulardii  250 mg Oral BID  . sodium chloride  10-40 mL Intracatheter Q12H  . sodium hypochlorite   Irrigation BID  . tigecycline (TYGACIL) IVPB  50 mg Intravenous Q12H  . tobramycin (PF)  300 mg Nebulization BID   Infusions:  . dextrose 50 mL/hr at 11/20/15 0700  . feeding supplement (JEVITY 1.5 CAL/FIBER) 1,000 mL (11/20/15 1941)  . heparin    . norepinephrine (LEVOPHED) Adult infusion Stopped (11/19/15 1043)    Assessment: Pharmacy consulted to initiate and titrate heparin drip in a 50 yo female in ICU due to possible pulmonary embolism.  Per consult note, no  bolus.   Baseline labs pending.  Est CrCl~60 mL/min, plts: 186, Hgb: 9.7  Goal of Therapy:  Heparin level 0.3-0.7 units/ml Monitor platelets by anticoagulation protocol: Yes   Plan:  Start heparin infusion at 1450 units/hr Check anti-Xa level in 6 hours and daily while on heparin Continue to monitor H&H and platelets  Catie Chiao G 11/21/2015,8:14 PM

## 2015-11-21 NOTE — Progress Notes (Signed)
eLink Physician-Brief Progress Note Patient Name: Courtney GrinderHedda McMillian [name] DOB: 02/10/1966 MRN: [id number]   Date of Service  11/21/2015  HPI/Events of Note  Contacted patient's husband Courtney [name](Courtney [name]) to make him aware of my concern for possible DVT/PE and patient has h/o DVT/PE previously at outside hospital. Informed he we would be starting systemic anticoagulation with heparin drip. Discussed risk of arrest with attempted change of tracheostomy tomorrow.  eICU Interventions  Continue current plan of care.     Intervention Category Major Interventions: Respiratory failure - evaluation and management  Lawanda CousinsJennings Colbe Viviano 11/21/2015, 9:41 PM

## 2015-11-21 NOTE — Progress Notes (Signed)
PT Cancellation Note  Patient Details Name: [name] MRN: [id number] DOB: 1966-08-11   Cancelled Treatment:    Reason Eval/Treat Not Completed: Patient at procedure or test/unavailable (Treatment session attempted.  Patient currently with nursing for dressing change, and scheduled for dialysis in PM.  Will re-attempt next date as patient available and medically appropriate.  Of note, noted CODE BLUE 12/30 due to vtach; per Dr. Belia HemanKasa, okay to continue PT as patient tolerates.)  Dru Laurel H. Manson PasseyBrown, PT, DPT, NCS 11/21/2015, 12:19 PM [phone]

## 2015-11-21 NOTE — Progress Notes (Signed)
eLink Physician-Brief Progress Note Patient Name: Courtney GrinderHedda McMillian [name] DOB: 04-04-1966 MRN: [id number]   Date of Service  11/21/2015  HPI/Events of Note  Acute worsening of hypoxic respiratory failure on ABG and no change in CXR. Concern for possible PE. Patient has anaphylaxis with contrast.  eICU Interventions  1. Stat EKG 2. Troponin I, BNP, PT, PTT , cbc stat 3. Stat TTE 4. Stat Venous Duplex bilat LE 5. Heparin gtt per pharmacy protocol without bolus 6. RN to notify husband of concern & change in status     Intervention Category Major Interventions: Respiratory failure - evaluation and management  Lawanda CousinsJennings Zebbie Ace 11/21/2015, 7:55 PM

## 2015-11-21 NOTE — Progress Notes (Addendum)
Paged Dr. Mortimer Fries regarding pt HR in the mid 50's. Pt is still responding and BP is normal. Ordered to get met b and magnesium checked. Awaiting results.

## 2015-11-21 NOTE — Progress Notes (Signed)
Tx started as prescribed. VSS. Pt does not appear to be in any distress. Generalized edema.  C/O pain all over. ICU nurse notified.  Acesss / lines visible.  Will continue to monitor throughout tx.  Family at bedside.

## 2015-11-21 NOTE — Progress Notes (Signed)
Spoke with Dr. Belia HemanKasa in rounds about changing patient trach. He said that will call ENT to assess.

## 2015-11-21 NOTE — Progress Notes (Signed)
Spoke with Mr. Courtney [name] at [phone] concerning condition of spouse.  Explained to him the worsening condition of spouse and the changes noted in her respiratory condition.  Explained to him that I had [facility] in [name] and he is very concerned over CourtneyWong's condition.  A stat ekg has been done, labs have been drawn and a heparin drip will be started.  There is a concern for a possible PE.  According to Mr. Courtney [name], there are "plans to change the trach out", "she has a problem with fluid build up, that's probably the problem".  According to Mr. Courtney [name], "[facility] and if we were to go back 2 1/2 years ago, we could see what they did" and "if one lung quits, there's always the other one."  Mr Courtney [name] feels that wife should be checked for clots in legs more, "every 2-3 days".  Again Mr. Courtney [name] was invited to come and speak via camera with Dr. Jamison [name] but did not want to.  He did say that if Dr. Jamison [name] wanted to, he could give him a call at the above number.  Otherwise he would speak to a physician tomorrow. I then relayed the information to Dr. Jamison [name].

## 2015-11-21 NOTE — Progress Notes (Signed)
Subjective:   Hemodialysis for later today.   Objective:  Vital signs in last 24 hours:  Temp:  [97.6 F (36.4 C)-98.7 F (37.1 C)] 97.6 F (36.4 C) (01/02 0800) Pulse Rate:  [31-65] 61 (01/02 1000) Resp:  [10-27] 24 (01/02 1000) BP: (106-131)/(40-94) 123/84 mmHg (01/02 1000) SpO2:  [90 %-100 %] 94 % (01/02 1000) FiO2 (%):  [40 %] 40 % (01/02 0810) Weight:  [107 kg (235 lb 14.3 oz)] 107 kg (235 lb 14.3 oz) (01/02 0500)  Weight change: 1 kg (2 lb 3.3 oz) Filed Weights   11/19/15 0425 11/20/15 0443 11/21/15 0500  Weight: 105 kg (231 lb 7.7 oz) 106 kg (233 lb 11 oz) 107 kg (235 lb 14.3 oz)    Intake/Output: I/O last 3 completed shifts: In: 2668.3 [I.V.:1790; Blood:350; NG/GT:225; IV Piggyback:303.3] Out: -    Intake/Output this shift:     Physical Exam: General: critically ill appearing  Head/ENT: OM moist, NG tube  Eyes: Anicteric, eyes closed  Neck: Tracheostomy in place  Lungs:  bilatearl rhonchi, vent assisted PRVC fio2 40% PEEP 5  Heart: S1S2 no rubs  Abdomen:  Soft, nontender, BS present  Extremities: 2+ dependent edema, anasarca  Neurologic: Resting comfortably at the moment  Access:  R IJ permcath.       Basic Metabolic Panel:  Recent Labs Lab 11/18/15 0830 11/18/15 1810 11/19/15 0630 11/21/15 0147 11/21/15 0434  NA 139 137 136 133*  --   K 3.7 3.2* 3.5 3.6  --   CL 101 101 100* 97*  --   CO2 28 30 27 28   --   GLUCOSE 109* 105* 83 133*  --   BUN 58* 41* 45* 67*  --   CREATININE 1.29* 0.98 1.06* 1.25* 1.42*  CALCIUM 8.2* 8.1* 8.1* 8.0*  --   MG 1.7 1.6* 2.1 2.1  --   PHOS 4.1  --   --   --   --     Liver Function Tests:  Recent Labs Lab 11/18/15 0830  AST 17  ALT 13*  ALKPHOS 1309*  BILITOT 0.7  PROT 5.6*  ALBUMIN 1.6*   No results for input(s): LIPASE, AMYLASE in the last 168 hours. No results for input(s): AMMONIA in the last 168 hours.  CBC:  Recent Labs Lab 11/16/15 1013 11/18/15 1205 11/19/15 0630 11/20/15 0948  WBC  34.3* 31.3* 20.5* 20.7*  NEUTROABS  --   --  17.2* 18.2*  HGB 11.0* 8.7* 7.5* 7.3*  HCT 38.1 29.0* 24.6* 24.8*  MCV 100.4* 95.0 94.5 94.0  PLT 199 280 229 229    Cardiac Enzymes:  Recent Labs Lab 11/18/15 1810 11/19/15 0031 11/19/15 0630  TROPONINI 0.06* 0.05* 0.13*    BNP: Invalid input(s): POCBNP  CBG:  Recent Labs Lab 11/20/15 0016 11/20/15 0704 11/20/15 1108 11/20/15 2337 11/21/15 0736  GLUCAP 94 104* 108* 126* 115*    Microbiology: Results for orders placed or performed during the hospital encounter of 09/10/2015  Blood Culture (routine x 2)     Status: None   Collection Time: 09-10-2015  8:51 AM  Result Value Ref Range Status   Specimen Description BLOOD [name]  Final   Special Requests BOTTLES DRAWN AEROBIC AND ANAEROBIC  3CC  Final   Culture NO GROWTH 5 DAYS  Final   Report Status 08/17/2015 FINAL  Final  Blood Culture (routine x 2)     Status: None   Collection Time: 10-Sep-2015  9:20 AM  Result Value Ref Range Status  Specimen Description BLOOD LEFT ARM  Final   Special Requests BOTTLES DRAWN AEROBIC AND ANAEROBIC  1CC  Final   Culture NO GROWTH 5 DAYS  Final   Report Status 08/17/2015 FINAL  Final  Wound culture     Status: None   Collection Time: 07/28/2015  9:20 AM  Result Value Ref Range Status   Specimen Description DECUBITIS  Final   Special Requests Normal  Final   Gram Stain   Final    FEW WBC SEEN MANY GRAM NEGATIVE RODS RARE GRAM POSITIVE COCCI    Culture   Final    HEAVY GROWTH ESCHERICHIA COLI MODERATE GROWTH ENTEROBACTER AEROGENES PROTEUS MIRABILIS HEAVY GROWTH ENTEROCOCCUS SPECIES VRE HAVE INTRINSIC RESISTANCE TO MOST COMMONLY USED ANTIBIOTICS AND THE ABILITY TO ACQUIRE RESISTANCE TO MOST AVAILABLE ANTIBIOTICS.    Report Status 08/16/2015 FINAL  Final   Organism ID, Bacteria ESCHERICHIA COLI  Final   Organism ID, Bacteria ENTEROBACTER AEROGENES  Final   Organism ID, Bacteria PROTEUS MIRABILIS  Final   Organism ID, Bacteria  ENTEROCOCCUS SPECIES  Final      Susceptibility   Enterobacter aerogenes - MIC*    CEFTAZIDIME <=1 SENSITIVE Sensitive     CEFAZOLIN >=64 RESISTANT Resistant     CEFTRIAXONE <=1 SENSITIVE Sensitive     CIPROFLOXACIN <=0.25 SENSITIVE Sensitive     GENTAMICIN <=1 SENSITIVE Sensitive     IMIPENEM 1 SENSITIVE Sensitive     TRIMETH/SULFA <=20 SENSITIVE Sensitive     * MODERATE GROWTH ENTEROBACTER AEROGENES   Escherichia coli - MIC*    AMPICILLIN <=2 SENSITIVE Sensitive     CEFTAZIDIME <=1 SENSITIVE Sensitive     CEFAZOLIN <=4 SENSITIVE Sensitive     CEFTRIAXONE <=1 SENSITIVE Sensitive     CIPROFLOXACIN <=0.25 SENSITIVE Sensitive     GENTAMICIN <=1 SENSITIVE Sensitive     IMIPENEM <=0.25 SENSITIVE Sensitive     TRIMETH/SULFA <=20 SENSITIVE Sensitive     * HEAVY GROWTH ESCHERICHIA COLI   Proteus mirabilis - MIC*    AMPICILLIN >=32 RESISTANT Resistant     CEFTAZIDIME <=1 SENSITIVE Sensitive     CEFAZOLIN 8 SENSITIVE Sensitive     CEFTRIAXONE <=1 SENSITIVE Sensitive     CIPROFLOXACIN <=0.25 SENSITIVE Sensitive     GENTAMICIN <=1 SENSITIVE Sensitive     IMIPENEM 1 SENSITIVE Sensitive     TRIMETH/SULFA <=20 SENSITIVE Sensitive     * PROTEUS MIRABILIS   Enterococcus species - MIC*    AMPICILLIN >=32 RESISTANT Resistant     VANCOMYCIN >=32 RESISTANT Resistant     GENTAMICIN SYNERGY SENSITIVE Sensitive     TETRACYCLINE Value in next row Resistant      RESISTANT>=16    * HEAVY GROWTH ENTEROCOCCUS SPECIES  MRSA PCR Screening     Status: None   Collection Time: 08/16/2015  2:38 PM  Result Value Ref Range Status   MRSA by PCR NEGATIVE NEGATIVE Final    Comment:        The GeneXpert MRSA Assay (FDA approved for NASAL specimens only), is one component of a comprehensive MRSA colonization surveillance program. It is not intended to diagnose MRSA infection nor to guide or monitor treatment for MRSA infections.   Blood culture (single)     Status: None   Collection Time: 07/23/2015  3:36  PM  Result Value Ref Range Status   Specimen Description BLOOD RIGHT ASSIST CONTROL  Final   Special Requests BOTTLES DRAWN AEROBIC AND ANAEROBIC  Final   Culture NO GROWTH  5 DAYS  Final   Report Status 08/17/2015 FINAL  Final  Wound culture     Status: None   Collection Time: 08/13/15 12:37 PM  Result Value Ref Range Status   Specimen Description WOUND  Final   Special Requests Normal  Final   Gram Stain   Final    FEW WBC SEEN TOO NUMEROUS TO COUNT GRAM NEGATIVE RODS FEW GRAM POSITIVE COCCI    Culture   Final    HEAVY GROWTH ESCHERICHIA COLI MODERATE GROWTH PROTEUS MIRABILIS LIGHT GROWTH KLEBSIELLA PNEUMONIAE MODERATE GROWTH ENTEROCOCCUS GALLINARUM CRITICAL RESULT CALLED TO, READ BACK BY AND VERIFIED WITH: [facility] BORBA AT 1042 08/16/15 DV    Report Status 08/17/2015 FINAL  Final   Organism ID, Bacteria ESCHERICHIA COLI  Final   Organism ID, Bacteria PROTEUS MIRABILIS  Final   Organism ID, Bacteria KLEBSIELLA PNEUMONIAE  Final   Organism ID, Bacteria ENTEROCOCCUS GALLINARUM  Final      Susceptibility   Escherichia coli - MIC*    AMPICILLIN >=32 RESISTANT Resistant     CEFTAZIDIME 4 RESISTANT Resistant     CEFAZOLIN >=64 RESISTANT Resistant     CEFTRIAXONE 16 RESISTANT Resistant     GENTAMICIN 2 SENSITIVE Sensitive     IMIPENEM >=16 RESISTANT Resistant     TRIMETH/SULFA <=20 SENSITIVE Sensitive     Extended ESBL POSITIVE Resistant     PIP/TAZO Value in next row Resistant      RESISTANT>=128    CIPROFLOXACIN Value in next row Sensitive      SENSITIVE<=0.25    * HEAVY GROWTH ESCHERICHIA COLI   Klebsiella pneumoniae - MIC*    AMPICILLIN Value in next row Resistant      SENSITIVE<=0.25    CEFTAZIDIME Value in next row Resistant      SENSITIVE<=0.25    CEFAZOLIN Value in next row Resistant      SENSITIVE<=0.25    CEFTRIAXONE Value in next row Resistant      SENSITIVE<=0.25    CIPROFLOXACIN Value in next row Resistant      SENSITIVE<=0.25    GENTAMICIN Value in next  row Sensitive      SENSITIVE<=0.25    IMIPENEM Value in next row Resistant      SENSITIVE<=0.25    TRIMETH/SULFA Value in next row Resistant      SENSITIVE<=0.25    PIP/TAZO Value in next row Resistant      RESISTANT>=128    * LIGHT GROWTH KLEBSIELLA PNEUMONIAE   Proteus mirabilis - MIC*    AMPICILLIN Value in next row Resistant      RESISTANT>=128    CEFTAZIDIME Value in next row Sensitive      RESISTANT>=128    CEFAZOLIN Value in next row Sensitive      RESISTANT>=128    CEFTRIAXONE Value in next row Sensitive      RESISTANT>=128    CIPROFLOXACIN Value in next row Sensitive      RESISTANT>=128    GENTAMICIN Value in next row Sensitive      RESISTANT>=128    IMIPENEM Value in next row Sensitive      RESISTANT>=128    TRIMETH/SULFA Value in next row Sensitive      RESISTANT>=128    PIP/TAZO Value in next row Sensitive      SENSITIVE<=4    * MODERATE GROWTH PROTEUS MIRABILIS   Enterococcus gallinarum - MIC*    AMPICILLIN Value in next row Resistant      SENSITIVE<=4    GENTAMICIN SYNERGY Value in  next row Sensitive      SENSITIVE<=4    CIPROFLOXACIN Value in next row Resistant      RESISTANT>=8    TETRACYCLINE Value in next row Resistant      RESISTANT>=16    * MODERATE GROWTH ENTEROCOCCUS GALLINARUM  Wound culture     Status: None   Collection Time: 08/15/15  2:53 PM  Result Value Ref Range Status   Specimen Description WOUND  Final   Special Requests NONE  Final   Gram Stain FEW WBC SEEN NO ORGANISMS SEEN   Final   Culture NO GROWTH 3 DAYS  Final   Report Status 08/18/2015 FINAL  Final  C difficile quick scan w PCR reflex     Status: None   Collection Time: 08/17/15 11:34 AM  Result Value Ref Range Status   C Diff antigen NEGATIVE NEGATIVE Final   C Diff toxin NEGATIVE NEGATIVE Final   C Diff interpretation Negative for C. difficile  Final  Stool culture     Status: None   Collection Time: 09/03/15  3:51 PM  Result Value Ref Range Status   Specimen  Description STOOL  Final   Special Requests Immunocompromised  Final   Culture   Final    NO SALMONELLA OR SHIGELLA ISOLATED No Pathogenic E. coli detected NO CAMPYLOBACTER DETECTED    Report Status 09/07/2015 FINAL  Final  C difficile quick scan w PCR reflex     Status: None   Collection Time: 09/13/15 12:51 AM  Result Value Ref Range Status   C Diff antigen NEGATIVE NEGATIVE Final   C Diff toxin NEGATIVE NEGATIVE Final   C Diff interpretation Negative for C. difficile  Final  Culture, blood (routine x 2)     Status: None   Collection Time: 09/16/15 11:24 AM  Result Value Ref Range Status   Specimen Description BLOOD LEFT HAND  Final   Special Requests BOTTLES DRAWN AEROBIC AND ANAEROBIC  1CC  Final   Culture NO GROWTH 5 DAYS  Final   Report Status 09/21/2015 FINAL  Final  Culture, blood (routine x 2)     Status: None   Collection Time: 09/16/15 12:23 PM  Result Value Ref Range Status   Specimen Description BLOOD RIGHT HAND  Final   Special Requests BOTTLES DRAWN AEROBIC AND ANAEROBIC  1CC  Final   Culture  Setup Time   Final    GRAM POSITIVE COCCI AEROBIC BOTTLE ONLY CRITICAL RESULT CALLED TO, READ BACK BY AND VERIFIED WITH: TESS THOMAS,RN 09/17/2015 0631 BY JRS.    Culture   Final    ENTEROCOCCUS FAECALIS AEROBIC BOTTLE ONLY VRE HAVE INTRINSIC RESISTANCE TO MOST COMMONLY USED ANTIBIOTICS AND THE ABILITY TO ACQUIRE RESISTANCE TO MOST AVAILABLE ANTIBIOTICS. CRITICAL RESULT CALLED TO, READ BACK BY AND VERIFIED WITH: CHERYL SMITH AT [id number] 09/19/15 DV    Report Status 09/21/2015 FINAL  Final   Organism ID, Bacteria ENTEROCOCCUS FAECALIS  Final      Susceptibility   Enterococcus faecalis - MIC*    AMPICILLIN <=2 SENSITIVE Sensitive     LINEZOLID 2 SENSITIVE Sensitive     CIPROFLOXACIN Value in next row Resistant      RESISTANT>=8    TETRACYCLINE Value in next row Resistant      RESISTANT>=16    VANCOMYCIN Value in next row Resistant      RESISTANT>=32    GENTAMICIN  SYNERGY Value in next row Resistant      RESISTANT>=32    * ENTEROCOCCUS FAECALIS  Culture, respiratory (NON-Expectorated)     Status: None   Collection Time: 09/16/15  3:50 PM  Result Value Ref Range Status   Specimen Description TRACHEAL ASPIRATE  Final   Special Requests Immunocompromised  Final   Gram Stain   Final    FEW WBC SEEN GOOD SPECIMEN - 80-90% WBCS RARE GRAM NEGATIVE RODS    Culture LIGHT GROWTH PSEUDOMONAS AERUGINOSA  Final   Report Status 09/19/2015 FINAL  Final   Organism ID, Bacteria PSEUDOMONAS AERUGINOSA  Final      Susceptibility   Pseudomonas aeruginosa - MIC*    CEFTAZIDIME 8 SENSITIVE Sensitive     CIPROFLOXACIN 2 INTERMEDIATE Intermediate     GENTAMICIN >=16 RESISTANT Resistant     IMIPENEM >=16 RESISTANT Resistant     PIP/TAZO Value in next row Sensitive      SENSITIVE32    CEFEPIME Value in next row Sensitive      SENSITIVE8    LEVOFLOXACIN Value in next row Resistant      RESISTANT>=8    * LIGHT GROWTH PSEUDOMONAS AERUGINOSA  Culture, expectorated sputum-assessment     Status: None   Collection Time: 09/22/15  2:09 PM  Result Value Ref Range Status   Specimen Description ENDOTRACHEAL  Final   Special Requests Normal  Final   Sputum evaluation THIS SPECIMEN IS ACCEPTABLE FOR SPUTUM CULTURE  Final   Report Status 09/24/2015 FINAL  Final  Culture, respiratory (NON-Expectorated)     Status: None   Collection Time: 09/22/15  2:09 PM  Result Value Ref Range Status   Specimen Description ENDOTRACHEAL  Final   Special Requests Normal Reflexed from [id number]  Final   Gram Stain   Final    FEW WBC SEEN FEW GRAM NEGATIVE RODS POOR SPECIMEN - LESS THAN 70% WBCS    Culture   Final    MODERATE GROWTH PSEUDOMONAS AERUGINOSA LIGHT GROWTH KLEBSIELLA PNEUMONIAE REFER TO SENSITIVITIES FROM PREVIOUS CULTURE FOR ORG 2    Report Status 10/01/2015 FINAL  Final   Organism ID, Bacteria PSEUDOMONAS AERUGINOSA  Final   Organism ID, Bacteria KLEBSIELLA PNEUMONIAE   Final      Susceptibility   Pseudomonas aeruginosa - MIC*    CEFTAZIDIME 4 SENSITIVE Sensitive     CIPROFLOXACIN 2 INTERMEDIATE Intermediate     GENTAMICIN >=16 RESISTANT Resistant     IMIPENEM >=16 RESISTANT Resistant     PIP/TAZO Value in next row Sensitive      SENSITIVE16    * MODERATE GROWTH PSEUDOMONAS AERUGINOSA  Tissue culture     Status: None   Collection Time: 09/24/15  6:44 AM  Result Value Ref Range Status   Specimen Description BONE  Final   Special Requests Normal  Final   Gram Stain MODERATE WBC SEEN FEW GRAM NEGATIVE RODS   Final   Culture   Final    MODERATE GROWTH ESCHERICHIA COLI MODERATE GROWTH KLEBSIELLA PNEUMONIAE ESBL-EXTENDED SPECTRUM BETA LACTAMASE-THE ORGANISM IS RESISTANT TO PENICILLINS, CEPHALOSPORINS AND AZTREONAM ACCORDING TO CLSI M100-S15 VOL.25 N01 JAN 2005. ORGANISM 1 This organism isolate is resistant to one or more antiotic agents in three or more antimicrobial categories.  Suggest Infectious Disease consult.   ORGANISM 2 CRITICAL RESULT CALLED TO, READ BACK BY AND VERIFIED WITH: RN Mardene Celeste LINDSAY 09/27/15 1005AM    Report Status 09/28/2015 FINAL  Final   Organism ID, Bacteria ESCHERICHIA COLI  Final   Organism ID, Bacteria KLEBSIELLA PNEUMONIAE  Final      Susceptibility   Escherichia coli - MIC*  AMPICILLIN >=32 RESISTANT Resistant     CEFTAZIDIME 4 RESISTANT Resistant     CEFAZOLIN >=64 RESISTANT Resistant     CEFTRIAXONE 8 RESISTANT Resistant     GENTAMICIN <=1 SENSITIVE Sensitive     IMIPENEM 8 RESISTANT Resistant     TRIMETH/SULFA <=20 SENSITIVE Sensitive     Extended ESBL POSITIVE Resistant     PIP/TAZO Value in next row Resistant      RESISTANT>=128    * MODERATE GROWTH ESCHERICHIA COLI   Klebsiella pneumoniae - MIC*    AMPICILLIN Value in next row Resistant      RESISTANT>=128    CEFTAZIDIME Value in next row Resistant      RESISTANT>=128    CEFAZOLIN Value in next row Resistant      RESISTANT>=128    CEFTRIAXONE Value in  next row Resistant      RESISTANT>=128    CIPROFLOXACIN Value in next row Resistant      RESISTANT>=128    GENTAMICIN Value in next row Resistant      RESISTANT>=128    IMIPENEM Value in next row Resistant      RESISTANT>=128    TRIMETH/SULFA Value in next row Sensitive      RESISTANT>=128    PIP/TAZO Value in next row Resistant      RESISTANT>=128    * MODERATE GROWTH KLEBSIELLA PNEUMONIAE  Anaerobic culture     Status: None   Collection Time: 09/24/15  3:55 PM  Result Value Ref Range Status   Specimen Description BONE  Final   Special Requests Normal  Final   Culture NO ANAEROBES ISOLATED  Final   Report Status 09/29/2015 FINAL  Final  Culture, fungus without smear     Status: None   Collection Time: 09/24/15  3:55 PM  Result Value Ref Range Status   Specimen Description BONE  Final   Special Requests Normal  Final   Culture NO FUNGUS ISOLATED AFTER 24 DAYS  Final   Report Status 10/18/2015 FINAL  Final  C difficile quick scan w PCR reflex     Status: None   Collection Time: 10/07/15  2:02 PM  Result Value Ref Range Status   C Diff antigen NEGATIVE NEGATIVE Final   C Diff toxin NEGATIVE NEGATIVE Final   C Diff interpretation Negative for C. difficile  Final  Culture, blood (routine x 2)     Status: None   Collection Time: 10/16/15  9:52 AM  Result Value Ref Range Status   Specimen Description BLOOD LEFT HAND  Final   Special Requests   Final    BOTTLES DRAWN AEROBIC AND ANAEROBIC  AER 6CC ANA 4CC   Culture NO GROWTH 6 DAYS  Final   Report Status 10/22/2015 FINAL  Final  Culture, blood (routine x 2)     Status: None   Collection Time: 10/16/15 12:25 PM  Result Value Ref Range Status   Specimen Description BLOOD LEFT HAND  Final   Special Requests BOTTLES DRAWN AEROBIC AND ANAEROBIC  5CC  Final   Culture NO GROWTH 6 DAYS  Final   Report Status 10/22/2015 FINAL  Final  Culture, expectorated sputum-assessment     Status: None   Collection Time: 10/18/15  1:15 PM   Result Value Ref Range Status   Specimen Description SPUTUM  Final   Special Requests NONE  Final   Sputum evaluation THIS SPECIMEN IS ACCEPTABLE FOR SPUTUM CULTURE  Final   Report Status 10/18/2015 FINAL  Final  Culture, respiratory (NON-Expectorated)  Status: None   Collection Time: 10/18/15  1:15 PM  Result Value Ref Range Status   Specimen Description SPUTUM  Final   Special Requests NONE Reflexed from [id number]  Final   Gram Stain   Final    GOOD SPECIMEN - 80-90% WBCS MODERATE WBC SEEN MANY GRAM NEGATIVE RODS    Culture   Final    HEAVY GROWTH PSEUDOMONAS AERUGINOSA MODERATE GROWTH KLEBSIELLA PNEUMONIAE CRITICAL RESULT CALLED TO, READ BACK BY AND VERIFIED WITHRod Mae AT 1610 10/21/15 DV KLEBSIELLA PNEUMONIAE This organism isolate is resistant to one or more antiotic agents in three or more antimicrobial categories.  Suggest Infectious Disease  consult.      Report Status 10/22/2015 FINAL  Final   Organism ID, Bacteria PSEUDOMONAS AERUGINOSA  Final   Organism ID, Bacteria KLEBSIELLA PNEUMONIAE  Final      Susceptibility   Klebsiella pneumoniae - MIC*    AMPICILLIN >=32 RESISTANT Resistant     CEFAZOLIN >=64 RESISTANT Resistant     CEFTRIAXONE >=64 RESISTANT Resistant     CIPROFLOXACIN >=4 RESISTANT Resistant     GENTAMICIN >=16 RESISTANT Resistant     IMIPENEM >=16 RESISTANT Resistant     NITROFURANTOIN >=512 RESISTANT Resistant     TRIMETH/SULFA <=20 SENSITIVE Sensitive     * MODERATE GROWTH KLEBSIELLA PNEUMONIAE   Pseudomonas aeruginosa - MIC*    CEFTAZIDIME 4 SENSITIVE Sensitive     CIPROFLOXACIN >=4 RESISTANT Resistant     GENTAMICIN 8 INTERMEDIATE Intermediate     IMIPENEM >=16 RESISTANT Resistant     PIP/TAZO Value in next row Sensitive      SENSITIVE32    * HEAVY GROWTH PSEUDOMONAS AERUGINOSA  C difficile quick scan w PCR reflex     Status: None   Collection Time: 10/18/15  4:39 PM  Result Value Ref Range Status   C Diff antigen NEGATIVE NEGATIVE  Final   C Diff toxin NEGATIVE NEGATIVE Final   C Diff interpretation Negative for C. difficile  Final  Culture, blood (Routine X 2) w Reflex to ID Panel     Status: None   Collection Time: 11/06/15  8:27 AM  Result Value Ref Range Status   Specimen Description BLOOD RIGHT HAND  Final   Special Requests BOTTLES DRAWN AEROBIC AND ANAEROBIC 3CC  Final   Culture NO GROWTH 5 DAYS  Final   Report Status 11/11/2015 FINAL  Final  Culture, blood (Routine X 2) w Reflex to ID Panel     Status: None   Collection Time: 11/06/15  8:29 AM  Result Value Ref Range Status   Specimen Description BLOOD LEFT HAND  Final   Special Requests BOTTLES DRAWN AEROBIC AND ANAEROBIC  1CC  Final   Culture NO GROWTH 5 DAYS  Final   Report Status 11/11/2015 FINAL  Final  Culture, expectorated sputum-assessment     Status: None   Collection Time: 11/06/15  8:41 AM  Result Value Ref Range Status   Specimen Description EXPECTORATED SPUTUM  Final   Special Requests Immunocompromised  Final   Sputum evaluation THIS SPECIMEN IS ACCEPTABLE FOR SPUTUM CULTURE  Final   Report Status 11/06/2015 FINAL  Final  Culture, respiratory (NON-Expectorated)     Status: None   Collection Time: 11/06/15  8:41 AM  Result Value Ref Range Status   Specimen Description EXPECTORATED SPUTUM  Final   Special Requests Immunocompromised Reflexed from [id number]  Final   Gram Stain   Final    MANY WBC SEEN MANY GRAM  NEGATIVE RODS EXCELLENT SPECIMEN - 90-100% WBCS    Culture   Final    MODERATE GROWTH KLEBSIELLA PNEUMONIAE MODERATE GROWTH PSEUDOMONAS AERUGINOSA CRITICAL RESULT CALLED TO, READ BACK BY AND VERIFIED WITH: DR. FITZGERALD AT 1120 11/09/15 DV DR. FITZGERALD REQUESTED ORGANISM BE SENT OUT FOR TIGECYCLINE CEFT/AVIBACTAM AND COLISTIN Sent to [facility] for further susceptibility testing. 11/11/15 CTJ    Report Status 11/15/2015 FINAL  Final   Organism ID, Bacteria KLEBSIELLA PNEUMONIAE  Final   Organism ID, Bacteria PSEUDOMONAS  AERUGINOSA  Final      Susceptibility   Klebsiella pneumoniae - MIC*    AMPICILLIN >=32 RESISTANT Resistant     CEFAZOLIN >=64 RESISTANT Resistant     CEFTRIAXONE 32 RESISTANT Resistant     CIPROFLOXACIN >=4 RESISTANT Resistant     GENTAMICIN >=16 RESISTANT Resistant     IMIPENEM >=16 RESISTANT Resistant     NITROFURANTOIN 256 RESISTANT Resistant     TRIMETH/SULFA >=320 RESISTANT Resistant     Extended ESBL POSITIVE Resistant     * MODERATE GROWTH KLEBSIELLA PNEUMONIAE   Pseudomonas aeruginosa - MIC*    CEFTAZIDIME >=64 RESISTANT Resistant     CIPROFLOXACIN 2 INTERMEDIATE Intermediate     GENTAMICIN >=16 RESISTANT Resistant     IMIPENEM >=16 RESISTANT Resistant     * MODERATE GROWTH PSEUDOMONAS AERUGINOSA  Culture, respiratory (NON-Expectorated)     Status: None   Collection Time: 11/14/15  2:07 PM  Result Value Ref Range Status   Specimen Description TRACHEAL ASPIRATE  Final   Special Requests NONE  Final   Gram Stain   Final    EXCELLENT SPECIMEN - 90-100% WBCS MANY WBC SEEN MODERATE GRAM NEGATIVE RODS    Culture   Final    HEAVY GROWTH PSEUDOMONAS AERUGINOSA CRITICAL VALUE NOTED.  VALUE IS CONSISTENT WITH PREVIOUSLY REPORTED AND CALLED VALUE.    Report Status 11/16/2015 FINAL  Final   Organism ID, Bacteria PSEUDOMONAS AERUGINOSA  Final      Susceptibility   Pseudomonas aeruginosa - MIC*    CEFTAZIDIME >=64 RESISTANT Resistant     CIPROFLOXACIN 2 INTERMEDIATE Intermediate     GENTAMICIN >=16 RESISTANT Resistant     IMIPENEM >=16 RESISTANT Resistant     PIP/TAZO Value in next row Resistant      RESISTANT>=128    TOBRAMYCIN Value in next row Sensitive      SENSITIVE2    * HEAVY GROWTH PSEUDOMONAS AERUGINOSA    Coagulation Studies: No results for input(s): LABPROT, INR in the last 72 hours.  Urinalysis: No results for input(s): COLORURINE, LABSPEC, PHURINE, GLUCOSEU, HGBUR, BILIRUBINUR, KETONESUR, PROTEINUR, UROBILINOGEN, NITRITE, LEUKOCYTESUR in the last 72  hours.  Invalid input(s): APPERANCEUR    Imaging: No results found.   Medications:   . dextrose 50 mL/hr at 11/20/15 0700  . feeding supplement (JEVITY 1.5 CAL/FIBER) 1,000 mL (11/20/15 1941)  . norepinephrine (LEVOPHED) Adult infusion Stopped (11/19/15 1043)   . albumin human  12.5 g Intravenous Once  . amiodarone  200 mg Per Tube BID   Followed by  . [START ON 11/25/2015] amiodarone  200 mg Per Tube Daily  . antiseptic oral rinse  7 mL Mouth Rinse QID  . aspirin  81 mg Oral Daily  . chlorhexidine gluconate  15 mL Mouth Rinse BID  . colistimethate (COLISTIN) IV  250 mg Intravenous Q48H  . epoetin (EPOGEN/PROCRIT) injection  10,000 Units Intravenous Q M,W,F-HD  . escitalopram  10 mg Oral Daily  . feeding supplement (PRO-STAT SUGAR FREE 64)  30 mL Oral 6 X Daily  . free water  20 mL Per Tube 6 times per day  . heparin subcutaneous  5,000 Units Subcutaneous Q12H  . hydrocortisone  10 mg Per Tube BID  . lidocaine  1 patch Transdermal Q24H  . metoprolol tartrate  12.5 mg Oral BID  . midodrine  10 mg Per Tube TID WC  . mirtazapine  7.5 mg Oral QHS  . multivitamin  5 mL Oral Daily  . pantoprazole sodium  40 mg Per Tube Daily  . saccharomyces boulardii  250 mg Oral BID  . sodium chloride  10-40 mL Intracatheter Q12H  . sodium hypochlorite   Irrigation BID  . tigecycline (TYGACIL) IVPB  50 mg Intravenous Q12H  . tobramycin (PF)  300 mg Nebulization BID   acetaminophen (TYLENOL) oral liquid 160 mg/5 mL, HYDROmorphone (DILAUDID) injection, ipratropium-albuterol, loperamide, morphine injection, ondansetron (ZOFRAN) IV, oxyCODONE, polyvinyl alcohol, sodium chloride, zinc oxide  Assessment/ Plan:  50 y.o. 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 Admitted on 08/17/2015  Echo: 11/07/19: EF of 30% to 35%.   1. End-stage renal disease on hemodialysis on HD MWF. The patient has been on dialysis since October of 2014. R IJ permcath.  - Dialysis treatment with IV albumin for oncotic support.  - Midodrine and hydrocortisone ordered for blood pressure support.  - Continue MWF schedule, monitor daily for dailysis treatment. Next treatment for later today. Will attempt more UF.   2. Anemia of CKD. Hemoglobin 7.3 - Epogen and periodic blood transfusions.   3. Acute resp failure: ventilator dependent -prolonged course on ventilator, fio2 currently 40% with PEEP of 5.   4. Sepsis: pseudomonas in tracheal aspirate - tobramycin inhaled and tigecycline.   5. Secondary Hyperparathyroidism: PTH 50, phos 4.1 - not currently on a binder or vitamin D agent.     LOS: 101 Tyner Codner 1/2/201712:03 PM

## 2015-11-21 NOTE — Progress Notes (Addendum)
ANTIBIOTIC CONSULT NOTE - follow Up  Pharmacy Consult for Colistin and Tigecycline  Indication: MDR Respiratory Infection   Allergies  Allergen Reactions  . Contrast Media [Iodinated Diagnostic Agents] Anaphylaxis  . Ampicillin Rash   Patient Measurements: Height:  (SCALE BROKE) Weight: 235 lb 14.3 oz (107 kg) IBW/kg (Calculated) : 66.2  Vital Signs: Temp: 97.6 F (36.4 C) (01/02 0800) Temp Source: Axillary (01/02 0800) BP: 123/84 mmHg (01/02 1000) Pulse Rate: 61 (01/02 1000) Intake/Output from previous day: 01/01 0701 - 01/02 0700 In: 1968.3 [I.V.:1190; Blood:350; NG/GT:225; IV Piggyback:203.3] Out: -  Intake/Output from this shift:    Recent Labs  11/19/15 0630 11/20/15 0948 11/21/15 0147 11/21/15 0434  WBC 20.5* 20.7*  --   --   HGB 7.5* 7.3*  --   --   PLT 229 229  --   --   CREATININE 1.06*  --  1.25* 1.42*   Estimated Creatinine Clearance: 61.7 mL/min (by C-G formula based on Cr of 1.42). No results for input(s): VANCOTROUGH, VANCOPEAK, VANCORANDOM, GENTTROUGH, GENTPEAK, GENTRANDOM, TOBRATROUGH, TOBRAPEAK, TOBRARND, AMIKACINPEAK, AMIKACINTROU, AMIKACIN in the last 72 hours.   Microbiology: Recent Results (from the past 720 hour(s))  Culture, blood (Routine X 2) w Reflex to ID Panel     Status: None   Collection Time: 11/06/15  8:27 AM  Result Value Ref Range Status   Specimen Description BLOOD RIGHT HAND  Final   Special Requests BOTTLES DRAWN AEROBIC AND ANAEROBIC 3CC  Final   Culture NO GROWTH 5 DAYS  Final   Report Status 11/11/2015 FINAL  Final  Culture, blood (Routine X 2) w Reflex to ID Panel     Status: None   Collection Time: 11/06/15  8:29 AM  Result Value Ref Range Status   Specimen Description BLOOD LEFT HAND  Final   Special Requests BOTTLES DRAWN AEROBIC AND ANAEROBIC  1CC  Final   Culture NO GROWTH 5 DAYS  Final   Report Status 11/11/2015 FINAL  Final  Culture, expectorated sputum-assessment     Status: None   Collection Time: 11/06/15   8:41 AM  Result Value Ref Range Status   Specimen Description EXPECTORATED SPUTUM  Final   Special Requests Immunocompromised  Final   Sputum evaluation THIS SPECIMEN IS ACCEPTABLE FOR SPUTUM CULTURE  Final   Report Status 11/06/2015 FINAL  Final  Culture, respiratory (NON-Expectorated)     Status: None   Collection Time: 11/06/15  8:41 AM  Result Value Ref Range Status   Specimen Description EXPECTORATED SPUTUM  Final   Special Requests Immunocompromised Reflexed from [id number]  Final   Gram Stain   Final    MANY WBC SEEN MANY GRAM NEGATIVE RODS EXCELLENT SPECIMEN - 90-100% WBCS    Culture   Final    MODERATE GROWTH KLEBSIELLA PNEUMONIAE MODERATE GROWTH PSEUDOMONAS AERUGINOSA CRITICAL RESULT CALLED TO, READ BACK BY AND VERIFIED WITH: DR. FITZGERALD AT 1120 11/09/15 DV DR. FITZGERALD REQUESTED ORGANISM BE SENT OUT FOR TIGECYCLINE CEFT/AVIBACTAM AND COLISTIN Sent to [facility] for further susceptibility testing. 11/11/15 CTJ    Report Status 11/15/2015 FINAL  Final   Organism ID, Bacteria KLEBSIELLA PNEUMONIAE  Final   Organism ID, Bacteria PSEUDOMONAS AERUGINOSA  Final      Susceptibility   Klebsiella pneumoniae - MIC*    AMPICILLIN >=32 RESISTANT Resistant     CEFAZOLIN >=64 RESISTANT Resistant     CEFTRIAXONE 32 RESISTANT Resistant     CIPROFLOXACIN >=4 RESISTANT Resistant     GENTAMICIN >=16 RESISTANT Resistant  IMIPENEM >=16 RESISTANT Resistant     NITROFURANTOIN 256 RESISTANT Resistant     TRIMETH/SULFA >=320 RESISTANT Resistant     Extended ESBL POSITIVE Resistant     * MODERATE GROWTH KLEBSIELLA PNEUMONIAE   Pseudomonas aeruginosa - MIC*    CEFTAZIDIME >=64 RESISTANT Resistant     CIPROFLOXACIN 2 INTERMEDIATE Intermediate     GENTAMICIN >=16 RESISTANT Resistant     IMIPENEM >=16 RESISTANT Resistant     * MODERATE GROWTH PSEUDOMONAS AERUGINOSA  Culture, respiratory (NON-Expectorated)     Status: None   Collection Time: 11/14/15  2:07 PM  Result Value Ref Range Status    Specimen Description TRACHEAL ASPIRATE  Final   Special Requests NONE  Final   Gram Stain   Final    EXCELLENT SPECIMEN - 90-100% WBCS MANY WBC SEEN MODERATE GRAM NEGATIVE RODS    Culture   Final    HEAVY GROWTH PSEUDOMONAS AERUGINOSA CRITICAL VALUE NOTED.  VALUE IS CONSISTENT WITH PREVIOUSLY REPORTED AND CALLED VALUE.    Report Status 11/16/2015 FINAL  Final   Organism ID, Bacteria PSEUDOMONAS AERUGINOSA  Final      Susceptibility   Pseudomonas aeruginosa - MIC*    CEFTAZIDIME >=64 RESISTANT Resistant     CIPROFLOXACIN 2 INTERMEDIATE Intermediate     GENTAMICIN >=16 RESISTANT Resistant     IMIPENEM >=16 RESISTANT Resistant     PIP/TAZO Value in next row Resistant      RESISTANT>=128    TOBRAMYCIN Value in next row Sensitive      SENSITIVE2    * HEAVY GROWTH PSEUDOMONAS AERUGINOSA    Medical History: Past Medical History  Diagnosis Date  . Obesity   . Dyslipidemia   . Hypertension   . Coronary artery disease     s/p BMS 2010 LAD  . Dysrhythmia     ventricular tachycardia resolved after LAD stent and beta blocker  . Diabetes mellitus     with retinopathy, neuropathy and microalbuminemia  . 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 Patient is on day 78 of hospital stay, and has been treated for multiple infections during this time.   11/06/15: Respiratory cultures showing Pan-resistant  Klebsiella pneumoniae and Pseudomonas aeruginosa  Sputum has been sent to [facility] for susceptibility testing per Dr. Blane Ohara request.   Patient has increased respiratory secretions, leukocytosis and low grade fever   12/30: Alk phos elevated at 1309  Plan:  Sensitivity results for Avycaz, tigecycline, and colistin still  pending.   Continue tigecycline '50mg'$  IV q12h.   Continue  colistimethate '250mg'$  (~2.5 mg/kg) IV q48 hours.   Patient should  be monitored for nephrotoxicity and neurotoxicity.  Pharmacy to continue to follow per consult.      Ulice Dash, PharmD    11/21/2015 1:20 PM

## 2015-11-21 NOTE — Progress Notes (Signed)
OT Cancellation Note  Patient Details Name: Courtney [name] MRN: [id number] DOB: Sep 10, 1966   Cancelled Treatment:    Reason Eval/Treat Not Completed: Other (comment). Treatment attempt, patient getting dressing changed. Will re-try again.  [name], [name] M 11/21/2015, 11:59 AM

## 2015-11-21 NOTE — Progress Notes (Signed)
Paged Dr. Wynelle Linkkolluru about sodium of 131. Physician aware. No new orders.

## 2015-11-21 NOTE — Progress Notes (Signed)
Pt. O2 sats dropped to 85 on 40%. Pt. Was suctioned, lavaged and bagged. No significant sections were obtained. The elink Dr. Was called and an order for 100% and 8 peep was given as well as an ABG.

## 2015-11-21 NOTE — Progress Notes (Signed)
MEDICATION RELATED CONSULT NOTE - Follow up   Pharmacy Consult for Renal dosing  Allergies  Allergen Reactions  . Contrast Media [Iodinated Diagnostic Agents] Anaphylaxis  . Ampicillin Rash   Patient Measurements: Ht 685ft 9in Height:  (SCALE BROKE) Weight: 235 lb 14.3 oz (107 kg) IBW/kg (Calculated) : 66.2  Vital Signs: Temp: 97.6 F (36.4 C) (01/02 0800) Temp Source: Axillary (01/02 0800) BP: 123/84 mmHg (01/02 1000) Pulse Rate: 61 (01/02 1000)  Recent Labs  11/18/15 1810 11/19/15 0630 11/20/15 0948 11/21/15 0147 11/21/15 0434  WBC  --  20.5* 20.7*  --   --   HGB  --  7.5* 7.3*  --   --   HCT  --  24.6* 24.8*  --   --   PLT  --  229 229  --   --   CREATININE 0.98 1.06*  --  1.25* 1.42*  MG 1.6* 2.1  --  2.1  --    Estimated Creatinine Clearance: 61.7 mL/min (by C-G formula based on Cr of 1.42).  Assessment: 50 yo patient on HD. Pharmacy consulted for renal dosing of medications.   Plan:  No medications require adjustment at present. Pharmacy will continue to monitor and dose adjust as needed.     Courtney [name], PharmD   11/21/2015

## 2015-11-21 NOTE — Progress Notes (Signed)
ANTICOAGULATION CONSULT NOTE - Initial Consult  Pharmacy Consult for Heparin Drip Indication: pulmonary embolus  Allergies  Allergen Reactions  . Contrast Media [Iodinated Diagnostic Agents] Anaphylaxis  . Ampicillin Rash    Patient Measurements: Height:  (SCALE BROKE) Weight: 235 lb 14.3 oz (107 kg) IBW/kg (Calculated) : 66.2 Heparin Dosing Weight: 90 kg  Vital Signs: Temp: 97.4 F (36.3 C) (01/02 1900) Temp Source: Axillary (01/02 1900) BP: 116/82 mmHg (01/02 1900) Pulse Rate: 64 (01/02 1900)  Labs:  Recent Labs  11/19/15 0031  11/19/15 0630 11/20/15 0948 11/21/15 0147 11/21/15 0434 11/21/15 1026 11/21/15 1318 11/21/15 2012  HGB  --   < > 7.5* 7.3*  --   --  9.7*  --  10.1*  HCT  --   < > 24.6* 24.8*  --   --  30.8*  --  32.1*  PLT  --   < > 229 229  --   --  186  --  170  APTT  --   --   --   --   --   --   --   --  41*  LABPROT  --   --   --   --   --   --   --   --  18.8*  INR  --   --   --   --   --   --   --   --  1.57  CREATININE  --   < > 1.06*  --  1.25* 1.42*  --  1.46*  --   TROPONINI 0.05*  --  0.13*  --   --   --   --   --  0.05*  < > = values in this interval not displayed.  Estimated Creatinine Clearance: 60 mL/min (by C-G formula based on Cr of 1.46).   Medical History: Past Medical History  Diagnosis Date  . Obesity   . Dyslipidemia   . Hypertension   . Coronary artery disease     s/p BMS 2010 LAD  . Dysrhythmia     ventricular tachycardia resolved after LAD stent and beta blocker  . Diabetes mellitus     with retinopathy, neuropathy and microalbuminemia  . ESRD (end stage renal disease) on dialysis     Medications:  Scheduled:  . albumin human  12.5 g Intravenous Once  . amiodarone  200 mg Per Tube BID   Followed by  . [START ON 11/25/2015] amiodarone  200 mg Per Tube Daily  . antiseptic oral rinse  7 mL Mouth Rinse QID  . aspirin  81 mg Oral Daily  . chlorhexidine gluconate  15 mL Mouth Rinse BID  . colistimethate (COLISTIN)  IV  250 mg Intravenous Q48H  . epoetin (EPOGEN/PROCRIT) injection  10,000 Units Intravenous Q M,W,F-HD  . escitalopram  10 mg Oral Daily  . feeding supplement (PRO-STAT SUGAR FREE 64)  30 mL Oral 6 X Daily  . free water  20 mL Per Tube 6 times per day  . hydrocortisone  10 mg Per Tube BID  . lidocaine  1 patch Transdermal Q24H  . metoprolol tartrate  12.5 mg Oral BID  . midodrine  10 mg Per Tube TID WC  . mirtazapine  7.5 mg Oral QHS  . multivitamin  5 mL Oral Daily  . pantoprazole sodium  40 mg Per Tube Daily  . saccharomyces boulardii  250 mg Oral BID  . sodium chloride  10-40 mL Intracatheter Q12H  .  sodium hypochlorite   Irrigation BID  . tigecycline (TYGACIL) IVPB  50 mg Intravenous Q12H  . tobramycin (PF)  300 mg Nebulization BID   Infusions:  . dextrose 50 mL/hr at 11/20/15 0700  . feeding supplement (JEVITY 1.5 CAL/FIBER) 1,000 mL (11/20/15 1941)  . heparin 1,450 Units/hr (11/21/15 2106)  . norepinephrine (LEVOPHED) Adult infusion Stopped (11/19/15 1043)    Assessment: Pharmacy consulted to initiate and titrate heparin drip in a 50 yo female in ICU due to possible pulmonary embolism.  Per consult note, no bolus.   Baseline labs pending.  Est CrCl~60 mL/min, plts: 186, Hgb: 9.7  Goal of Therapy:  Heparin level 0.3-0.7 units/ml Monitor platelets by anticoagulation protocol: Yes   Plan:  Start heparin infusion at 1450 units/hr Check anti-Xa level in 6 hours and daily while on heparin Continue to monitor H&H and platelets.  Will draw 1st HL on 12/3 @ 3:00.   Erabella Kuipers D 11/21/2015,9:12 PM

## 2015-11-21 NOTE — Progress Notes (Signed)
PULMONARY / CRITICAL CARE MEDICINE   Name: Rexanna Louthan MRN: [id number] DOB: 07-26-1966    ADMISSION DATE:  08-22-2015  BRIEF HISTORY: 49 AAF who has been in medical facilities (hosp, LTAC, rehab) for 2 yrs following gastric bypass surgery with multiple complications. Now with chronic trach, ESRD, profound debilitation, severe sacral pressure ulcer. Was seemingly making progress and transferred to rehab facility approx one week prior to this admission. She was sent to [facility] ED with AMS and hypotension. Working dx of severe sepsis/septic shock due to infected sacral pressure ulcer. Since admission. Her course is been very complicated with numerous complications including septic shock and GI bleeding. Now with failure to wean from vent and possible MI event, repeat ECHO EF 30-35, possible old MI, severe hypokinesis of the nnterior and anteroseptal myocardium   INTERVAL HISTORY: intermittent V tach on oral amiodarone Now on full vent support TF's to be restarted at low dose Inhaled tobrramycin for resistant pseudomonas S/p blood transfusion yesterday-CBC pending today  Summary of MAJOR EVENTS/TEST RESULTS: Admission 02/07/14-05/07/14 Admission 07/21/14-09/06/14 Discharged to [facility]. Pt had palliative consult at that time, were asked to sign off by husband.  09/23 CT head: NAD 09/23 EEG: no epileptiform activity 09/23 PRBCs for Hgb 6.4 09/24 bedside debridement of sacral wound. Abscess drained 09/25 Off vasopressors. More alert. No distress. Worsening thrombocytopenia. Vanc DC'd 09/29 Dr. Sampson Goon (I.D) excused from the case by patient's husband. 10/02 MRI -multiple infarcts 10/03 tracheal bleeding- transfused platelets 10/03 hospitalist service excused from the case by patient's husband 10/03 Echocardiogram ejection fraction was 55-60%, pulmonary systolic pressure was 39 mmHg 11/91 restart TF's at lower rate, attempt reg HD  10/12 Transferred to med-surg floor. Remains on PCCM  service 10/14 SLP eval: pt unable to tolerate PMV adequately 10/17-will re-attempt PM valve-discussed with Speech therapist 10/18 passed swallow eval-start pureed thick foods no thin liquids-continue NG feeds 10/19 transferred to step down for sepsis/aspiration pneumonia 10/19 cxr shows RLL opacity 10/21 sacral decub debride at bedside by surgery 10/22 started back on vasopressors while on HD 10/27 CT with osteo, R hip fx, unable to identify tip of dubhoff tube - sent for fluoro study 10/28 Ortho consultation: I do not feel that she is a surgical candidate. Therefore, I feel that it would best to manage this fracture nonsurgically and allow it to heal by itself over time, which it should.  10/28 Gen Surg consultation: Due to the lack of free air or free flow of contrast and the peritoneum there is no indication for any surgical intervention on this. Would recommend pulling back the feeding tube 1-2 cm. Would recheck an abdominal film to confirm no pneumoperitoneum in the morning. Absence of any changes okay to continue using Dobbhoff for feeding and medications. 10/28 gastrograffin study: The study confirms that the feeding tube pes perforated through the duodenum and the tip is within a cavity that fills with injected contrast. The cavity does appear walled off 11/4 refuses oral feeds, continue TF's CT reviewed: T8-T9 discitis/osteomyelitis, RT HIP FRACTURE 11/5 placed back on vasopressors, placed back on Vent due to resp acidosis, levophed turned off 11/6 afternoon 11/5 PM Trach changed out due to cuff leak, #6 Shiley cuffed, 11/6 dubhoff occluded, removed and replaced, new dubhoff shows tube in the antral stomach 11/15 refusing to take oral feeds 11/18 placed back on Vent for increased WOB,SOB. 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 10/18/2015; the patient  developed a new left basal pneumonia; with recurrent  sepsis.  10/19/2015; fevers resolved with IV acetaminophen. Tube feeds changed from vital to Jevity.  12/7 CXR with stable b/l opacities.  12/12 elevated K s/p HD-remains on AC mode 12/16-vomiting-hold feeds and re-assess in next 24 hrs 12/17- TF restarted at 1/2 goal (goal = 50cc/per) 12/18- elevated wbc, fever (101.4), recultured, restarted on ceftaz 12/19 Pericardial friction rub. Echocardiogram: LVEF 30-35%, anteroseptal hypokinesis or akinesis c/w anterior wall MI 12/20 Dr Sung Amabile discussed new echocardiogram findings with pt's husband and explained that this likely represents an anterior wall MI sometime in the previous couple of weeks. It was explained that she is not a candidate for any cardiology intervention. Code status was again discussed. Husband expressed understanding that she was not going to survive this illness in any favorable way but continues to request that she be full code status based on what he believes to be her wishes 12/21 vomitied 50cc of TF - AM TF held, restart TF at 3pm @30cc  12/26 Persistent intermittent trach cuff leak. Distal XLT trach requested.  12/27 15 beat NSVT 12/28 patient refused HD and refused Bath time 12/29 D10 infusion for hypoglycemia, TF's on hold due to intolerance 12/30 CODE BLUE  VTACH 1/1 transfuse 2 units of blood   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 Pneumoniae, P. Mirabilis, VRE Wound 9/26 >> No growth Blood 9/23 >> NEG CDiff 9/27>>neg Stool Cx 10/15>> negative Cdiff 10/25>>neg Trach Aspirate 10/28>> light growth pseudomonas Blood 10/28 >> 1/2 GPC >>  Sputum  cultures obtained 09/22/15 due to mucus plugging>>Pseudomonas BONE TISSUE Cx 11/3>>E.coli (ESBL), k. Pneumonia (daptomycin and septra) Bld Cx 11/27>>negative thus far.  Trach Asp 11/27>> grew out Pseudomonas, and Klebsiella pneumonia. Trach Asp 12/18>>PAN RESISTANT Klebsiella, Pseudomonas Bld Cx 12/18>> NEG 12/26 resp culture >> pseudomonas  ANTIMICROBIALS:  Aztreonam 9/23 >> 9/24 Vanc 9/23 >> 9/25 Vanc 9/26>>9/27 Daptomycin 9/27>> 10/12, 10/28>> off Meropenem 9/23 >> 10/14, 10/28 >> 10/31 Zosyn 11/27,>> 11/30. Levaquin 11/29>> 11/29. Ceftaz 12/1>>12/11, 12/18>> 12/24 Colisitin 12/26 >>  Tigecycline 12/26 >>  Septra 11/8>> chronic Inhaled tobramycin 12/30>>  VITAL SIGNS: Temp:  [97.6 F (36.4 C)-98.7 F (37.1 C)] 97.6 F (36.4 C) (01/02 0800) Pulse Rate:  [31-70] 62 (01/02 0800) Resp:  [10-27] 21 (01/02 0800) BP: (103-131)/(40-94) 116/75 mmHg (01/02 0800) SpO2:  [90 %-100 %] 96 % (01/02 0800) FiO2 (%):  [40 %] 40 % (01/02 0808) Weight:  [235 lb 14.3 oz (107 kg)] 235 lb 14.3 oz (107 kg) (01/02 0500) HEMODYNAMICS:   VENTILATOR SETTINGS: Vent Mode:  [-] PRVC FiO2 (%):  [40 %] 40 % Set Rate:  [14 bmp] 14 bmp Vt Set:  [530 mL] 530 mL PEEP:  [5 cmH20] 5 cmH20 Pressure Support:  [5 cmH20] 5 cmH20 INTAKE / OUTPUT:  Intake/Output Summary (Last 24 hours) at 11/21/15 0908 Last data filed at 11/21/15 0600  Gross per 24 hour  Intake 1968.33 ml  Output      0 ml  Net 1968.33 ml    Review of Systems  Unable to perform ROS  Unable to fully obtain, due to trach and critical illness Physical Exam  Vent dependent Somonlent HEENT  WNL, NGT present Trach site clean Diffuse rhonchi Reg with occ extrasystoles, no M Abdomen soft, + BS Ext cool, trace symmetric edema, marked muscle wasting Severe sacral decubitus ulcer-   LABS:  CBC  Recent Labs Lab 11/18/15 1205 11/19/15 0630 11/20/15 0948  WBC 31.3* 20.5* 20.7*  HGB 8.7* 7.5* 7.3*  HCT 29.0* 24.6* 24.8*  PLT  280 229 229   Coag's No results for input(s): APTT, INR in the last 168 hours. BMET  Recent Labs Lab 11/18/15 1810 11/19/15 0630 11/21/15 0147 11/21/15 0434  NA 137 136 133*  --   K 3.2* 3.5 3.6  --   CL 101 100* 97*  --   CO2 30 27 28   --   BUN 41* 45* 67*  --   CREATININE 0.98 1.06* 1.25* 1.42*  GLUCOSE 105* 83 133*  --    Electrolytes  Recent Labs Lab 11/18/15 0830 11/18/15 1810 11/19/15 0630 11/21/15 0147  CALCIUM 8.2* 8.1* 8.1* 8.0*  MG 1.7 1.6* 2.1 2.1  PHOS 4.1  --   --   --    Sepsis Markers No results for input(s): LATICACIDVEN, PROCALCITON, O2SATVEN in the last 168 hours. ABG No results for input(s): PHART, PCO2ART, PO2ART in the last 168 hours. Liver Enzymes  Recent Labs Lab 11/18/15 0830  AST 17  ALT 13*  ALKPHOS 1309*  BILITOT 0.7  ALBUMIN 1.6*   Cardiac Enzymes  Recent Labs Lab 11/18/15 1810 11/19/15 0031 11/19/15 0630  TROPONINI 0.06* 0.05* 0.13*   Glucose  Recent Labs Lab 11/19/15 1107 11/20/15 0016 11/20/15 0704 11/20/15 1108 11/20/15 2337 11/21/15 0736  GLUCAP 80 94 104* 108* 126* 115*    Imaging No results found.  ASSESSMENT / PLAN: IMPRESSION: Very prolonged and complicated hospitalization after gastric bypass surgery 2014 Never returned home ESRD Recurrent severe sepsis - PAN resistant Klebisella in sputum Recurrent PNA-now with pseudomonas H/O C diff H/O VTE DM2 - controlled Episodic hypoglycemia Chronic steroid therapy, for a history of of adrenal insufficiency Incidental finding of R hip fx - conservative mgmt. 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 Husband declined TEE Profound deconditioning Chronic pain New finding of pericardial friction rub 12/19 New finding of  cardiomyopathy, likely post MI 12/19 Atrial flutter HCAP with MDR Klebs and pseudomonas HYPOGLYCEMIA  Cont vent support - settings reviewed and/or adjusted Cont vent bundle Daily SBT if/when meets criteria Cont HD per renal Monitor BMET intermittently Monitor I/Os Correct electrolytes as indicated DVT px: SQ heparin Monitor CBC intermittently Transfuse per usual ICU guidelines restart TF's at low rate Monitor temp, WBC count Micro and abx as above ID following - appreciate recs On oral amiodarone  for A. Flutter Recheck CXR 12/26 Recheck resp culture 12/26 ID consult 12/26 Colistin and tigecycline 12/26 per last recs of ID 12/21 Pain meds as needed D10 Infusion for hypoglycemia HGB 7.3-will transfuse 2 units of pRBCS    patient being kept alive with Vent and HD, prognosis is very poor, no meaningful recovery, will NOT change trach at this time due to near cardiac arrest  Recommend Comfort care measures, husband unreasonable. Patient with terminal medical conditions   The Patient requires high complexity decision making for assessment and support, frequent evaluation and titration of therapies, application of advanced monitoring technologies and extensive interpretation of multiple databases. Critical Care  Time devoted to patient care services described in this note is 35 minutes.   Overall, patient is critically ill, prognosis is guarded.  Patient with Multiorgan failure and at high risk for cardiac arrest and death.    Lucie LeatherKurian David Miner Koral, M.D.  [facility]  Medical Director [facility] Medical Director [facility]

## 2015-11-21 NOTE — Progress Notes (Signed)
Patient husband refused to where gown and gloves into room. PPE required for patient is on contact precautions. Educated husband of risks. He still refused.

## 2015-11-21 NOTE — Progress Notes (Signed)
Mr Archie PattenMonroe, pt's husband, called to check on pt before going to bed.  Mr. Archie PattenMonroe asked what had been done since we spoke last and if there were any changes.  I told him that the respiratory issue that we had talked about before and that he was previously aware of had not changed.  He then asked what her hr was and bp was and stated that as long as that is maintained she should be ok.  And, to "set her up to 40-45 degrees in bed and that might make a difference."

## 2015-11-21 NOTE — Progress Notes (Addendum)
Pt will open eyes when spoken to. C/o pain. PRN morphine given. Dressing changes done. Vent setting changed to 90% O2 with a PEEP of 8 per physician due to low o2 sats. CXr, and abg ordered. Inner cannula changed as well. l

## 2015-11-22 ENCOUNTER — Inpatient Hospital Stay (HOSPITAL_COMMUNITY)
Admit: 2015-11-22 | Discharge: 2015-11-22 | Disposition: A | Discharge status: Home / Self Care | Payer: 59 | Attending: Pulmonary Disease | Admitting: Pulmonary Disease

## 2015-11-22 DIAGNOSIS — A498 Other bacterial infections of unspecified site: Secondary | ICD-10-CM

## 2015-11-22 DIAGNOSIS — Z1624 Resistance to multiple antibiotics: Secondary | ICD-10-CM

## 2015-11-22 DIAGNOSIS — I255 Ischemic cardiomyopathy: Secondary | ICD-10-CM

## 2015-11-22 LAB — TYPE AND SCREEN
ABO/RH(D): A NEG
ANTIBODY SCREEN: NEGATIVE
Unit division: 0
Unit division: 0

## 2015-11-22 LAB — GLUCOSE, CAPILLARY
Glucose-Capillary: 97 mg/dL (ref 65–99)
Glucose-Capillary: 73 mg/dL (ref 65–99)
Glucose-Capillary: 103 mg/dL — ABNORMAL HIGH (ref 65–99)
Glucose-Capillary: 93 mg/dL (ref 65–99)
Glucose-Capillary: 96 mg/dL (ref 65–99)

## 2015-11-22 LAB — TROPONIN I
TROPONIN I: 0.16 ng/mL — AB (ref <0.031)
Troponin I: 0.06 ng/mL — ABNORMAL HIGH (ref <0.031)

## 2015-11-22 LAB — CBC
HEMATOCRIT: 30.7 % — AB (ref 35.0–47.0)
Hemoglobin: 9.6 g/dL — ABNORMAL LOW (ref 12.0–16.0)
MCH: 28.3 pg (ref 26.0–34.0)
MCHC: 31.2 g/dL — AB (ref 32.0–36.0)
MCV: 90.5 fL (ref 80.0–100.0)
Platelets: 153 10*3/uL (ref 150–440)
RBC: 3.39 MIL/uL — ABNORMAL LOW (ref 3.80–5.20)
RDW: 19.2 % — AB (ref 11.5–14.5)
WBC: 18.1 10*3/uL — ABNORMAL HIGH (ref 3.6–11.0)

## 2015-11-22 LAB — HEPARIN LEVEL (UNFRACTIONATED)

## 2015-11-22 MED ORDER — HEPARIN SODIUM (PORCINE) 5000 UNIT/ML IJ SOLN
5000.0000 [IU] | Freq: Two times a day (BID) | INTRAMUSCULAR | Status: DC
  Administered 2015-11-22 – 2015-12-05 (×27): 5000 [IU] via SUBCUTANEOUS
  Filled 2015-11-22 (×27): qty 1

## 2015-11-22 MED ORDER — HYDROMORPHONE HCL 1 MG/ML IJ SOLN
0.5000 mg | INTRAMUSCULAR | Status: DC | PRN
  Administered 2015-11-22 – 2015-11-29 (×14): 1 mg via INTRAVENOUS
  Administered 2015-11-29 – 2015-11-30 (×2): 0.5 mg via INTRAVENOUS
  Administered 2015-12-01 – 2015-12-06 (×13): 1 mg via INTRAVENOUS
  Filled 2015-11-22 (×29): qty 1

## 2015-11-22 MED ORDER — METOPROLOL TARTRATE 25 MG PO TABS
12.5000 mg | ORAL_TABLET | Freq: Two times a day (BID) | ORAL | Status: DC
  Administered 2015-11-22: 12.5 mg
  Filled 2015-11-22 (×2): qty 1

## 2015-11-22 MED ORDER — AMIODARONE HCL 200 MG PO TABS
100.0000 mg | ORAL_TABLET | Freq: Every day | ORAL | Status: DC
  Administered 2015-11-22 – 2015-12-06 (×15): 100 mg
  Filled 2015-11-22 (×2): qty 1
  Filled 2015-11-22: qty 2
  Filled 2015-11-22 (×12): qty 1

## 2015-11-22 MED ORDER — MIRTAZAPINE 15 MG PO TBDP
7.5000 mg | ORAL_TABLET | Freq: Every day | ORAL | Status: DC
  Administered 2015-11-22 – 2015-12-05 (×14): 7.5 mg
  Filled 2015-11-22: qty 2
  Filled 2015-11-22 (×12): qty 1
  Filled 2015-11-22: qty 2

## 2015-11-22 MED ORDER — ADULT MULTIVITAMIN LIQUID CH
5.0000 mL | Freq: Every day | ORAL | Status: DC
  Administered 2015-11-23 – 2015-12-06 (×14): 5 mL
  Filled 2015-11-22 (×14): qty 5

## 2015-11-22 MED ORDER — ASPIRIN 81 MG PO CHEW
81.0000 mg | CHEWABLE_TABLET | Freq: Every day | ORAL | Status: DC
  Administered 2015-11-23 – 2015-12-06 (×14): 81 mg
  Filled 2015-11-22 (×15): qty 1

## 2015-11-22 NOTE — Progress Notes (Signed)
Patients husband called and wanted to know what specific antibiotics she is currently on. Patient is resting at this time. Will continue to assess and monitor.

## 2015-11-22 NOTE — Progress Notes (Signed)
ID E note I have been intermittently following patient since admission Sept 23rd.  She has had recurrent multidrug resistant Pneumonia as well as a severe sacral decubitus ulcer, profound malnutrition and debilitation , ESRD on hemodialysis and vent dependent respiratory failure.  She has had progressive decline since admission with numerous complications including acute CVA, Gi bleed, cardiac arrest, MI, Cardiomyopathy.   She is now infected with almost pan resistant Klebsiella pneumoniae.  I agree with Dr Sung AmabileSimonds assessment that her prognosis is very poor without hope for functional recovery.  I feel further intensive care is futile and would suggest palliative care.

## 2015-11-22 NOTE — Progress Notes (Signed)
ANTICOAGULATION CONSULT NOTE - Initial Consult  Pharmacy Consult for Heparin Drip Indication: pulmonary embolus  Allergies  Allergen Reactions  . Contrast Media [Iodinated Diagnostic Agents] Anaphylaxis  . Ampicillin Rash    Patient Measurements: Height:  (SCALE BROKE) Weight: 224 lb 13.9 oz (102 kg) IBW/kg (Calculated) : 66.2 Heparin Dosing Weight: 90 kg  Vital Signs: Temp: 97.4 F (36.3 C) (01/03 0500) Temp Source: Axillary (01/03 0500) BP: 114/41 mmHg (01/03 0600) Pulse Rate: 66 (01/03 0600)  Labs:  Recent Labs  11/20/15 0948 11/21/15 0147 11/21/15 0434 11/21/15 1026 11/21/15 1318 11/21/15 2012 11/22/15 0202 11/22/15 0512  HGB 7.3*  --   --  9.7*  --  10.1*  --   --   HCT 24.8*  --   --  30.8*  --  32.1*  --   --   PLT 229  --   --  186  --  170  --   --   APTT  --   --   --   --   --  41*  --   --   LABPROT  --   --   --   --   --  18.8*  --   --   INR  --   --   --   --   --  1.57  --   --   HEPARINUNFRC  --   --   --   --   --   --   --  <0.10*  CREATININE  --  1.25* 1.42*  --  1.46*  --   --   --   TROPONINI  --   --   --   --   --  0.05* 0.16*  --     Estimated Creatinine Clearance: 58.6 mL/min (by C-G formula based on Cr of 1.46).   Medical History: Past Medical History  Diagnosis Date  . Obesity   . Dyslipidemia   . Hypertension   . Coronary artery disease     s/p BMS 2010 LAD  . Dysrhythmia     ventricular tachycardia resolved after LAD stent and beta blocker  . Diabetes mellitus     with retinopathy, neuropathy and microalbuminemia  . ESRD (end stage renal disease) on dialysis     Medications:  Scheduled:  . albumin human  12.5 g Intravenous Once  . amiodarone  200 mg Per Tube BID   Followed by  . [START ON 11/25/2015] amiodarone  200 mg Per Tube Daily  . antiseptic oral rinse  7 mL Mouth Rinse QID  . aspirin  81 mg Oral Daily  . chlorhexidine gluconate  15 mL Mouth Rinse BID  . colistimethate (COLISTIN) IV  250 mg Intravenous Q48H   . epoetin (EPOGEN/PROCRIT) injection  10,000 Units Intravenous Q M,W,F-HD  . escitalopram  10 mg Oral Daily  . feeding supplement (PRO-STAT SUGAR FREE 64)  30 mL Oral 6 X Daily  . free water  20 mL Per Tube 6 times per day  . hydrocortisone  10 mg Per Tube BID  . lidocaine  1 patch Transdermal Q24H  . metoprolol tartrate  12.5 mg Oral BID  . midodrine  10 mg Per Tube TID WC  . mirtazapine  7.5 mg Oral QHS  . multivitamin  5 mL Oral Daily  . pantoprazole sodium  40 mg Per Tube Daily  . saccharomyces boulardii  250 mg Oral BID  . sodium chloride  10-40 mL Intracatheter Q12H  .  sodium hypochlorite   Irrigation BID  . tigecycline (TYGACIL) IVPB  50 mg Intravenous Q12H  . tobramycin (PF)  300 mg Nebulization BID   Infusions:  . dextrose 50 mL/hr at 11/20/15 0700  . feeding supplement (JEVITY 1.5 CAL/FIBER) Stopped (11/22/15 0200)  . heparin 1,450 Units/hr (11/21/15 2106)  . norepinephrine (LEVOPHED) Adult infusion Stopped (11/19/15 1043)    Assessment: Pharmacy consulted to initiate and titrate heparin drip in a 50 yo female in ICU due to possible pulmonary embolism.  Per consult note, no bolus.   Baseline labs pending.  Est CrCl~60 mL/min, plts: 186, Hgb: 9.7  Goal of Therapy:  Heparin level 0.3-0.7 units/ml Monitor platelets by anticoagulation protocol: Yes   Plan:  Heparin level subtherapeutic. Spoke with RN who says solution is infusing appropriately. Increase rate to 1700 units/hr, will recheck level in 6 hours.  Carola Frost, Pharm.D., BCPS Clinical Pharmacist 11/22/2015,6:32 AM

## 2015-11-22 NOTE — Progress Notes (Signed)
PT Cancellation Note  Patient Details Name: Courtney [name] MRN: [id number] DOB: July 10, 1966   Cancelled Treatment:    Reason Eval/Treat Not Completed: Medical issues which prohibited therapy (Per chart review, patient with continued decline in respiratory status; now being treated with heparin drip for suspected PE.  Per policy, will hold activity x48 hours to all for full anticoagulation. However, question benefit of continued PT at this time given continued medical decline.  Will discuss with MD and proceed as medically appropriate.)  Parag Dorton H. Manson PasseyBrown, PT, DPT, NCS 11/22/2015, 8:19 AM [phone]

## 2015-11-22 NOTE — Progress Notes (Signed)
Pt continues to guppy breath on vent.  Pt will respond to voice but at this time appears to be too weak to follow commands.  Pt now on heparin drip per Dr. Jamison NeighborNestor for concern of possible PE. No bolus was given.Troponins were also ordered. Ultrasound of bilateral legs was ordered and completed.  Results in chart.  Tube feeds were stopped due to the concern of pt having thick tan secretions during the ET tube suctioning.  Mr. Courtney [name] was updated.

## 2015-11-22 NOTE — Progress Notes (Signed)
Spoke with elink concerning amount of secretions when pt was suctioned.  Pt had a moderate amount of thick tan secretions, what appeared to possibly be tube feeds.  Per elink, ok to stop tube feeds at this time.

## 2015-11-22 NOTE — Consult Note (Signed)
Courtney [name], Courtney [name] [id number] 04-04-1966 Shane CrutchPradeep Ramachandran, MD  Reason for Consult: Questionable need to change tracheostomy tube  HPI: 50 year old female with extensive past medical history I have en route reviewed the entire chart. She has been trach dependent for quite some time continues to require ventilatory assistance. There is a question about whether a different tracheostomy tube would improve her outcome.  Allergies:  Allergies  Allergen Reactions  . Contrast Media [Iodinated Diagnostic Agents] Anaphylaxis  . Ampicillin Rash    ROS: She is intubated and sedated  PMH:  Past Medical History  Diagnosis Date  . Obesity   . Dyslipidemia   . Hypertension   . Coronary artery disease     s/p BMS 2010 LAD  . Dysrhythmia     ventricular tachycardia resolved after LAD stent and beta blocker  . Diabetes mellitus     with retinopathy, neuropathy and microalbuminemia  . ESRD (end stage renal disease) on dialysis     FH:  Family History  Problem Relation Age of Onset  . Hypertension Father   . Diabetes Father   . Cancer Mother   . Diabetes Paternal Grandfather   . Hypertension Paternal Grandfather   . Diabetes Paternal Grandmother   . Hypertension Paternal Grandmother   . Cancer Maternal Grandmother     colon ca    SH:  Social History   Social History  . Marital Status: Married    Spouse Name: N/A  . Number of Children: N/A  . Years of Education: N/A   Occupational History  . Not on file.   Social History Main Topics  . Smoking status: Never Smoker   . Smokeless tobacco: Never Used  . Alcohol Use: No     Comment: occassional  . Drug Use: No  . Sexual Activity: Yes   Other Topics Concern  . Not on file   Social History Narrative    PSH:  Past Surgical History  Procedure Laterality Date  . Cholecystectomy  1990  . Pci lad  12/2010  . Colonoscopy  08/2011  . Left heart catheterization with coronary angiogram N/A 01/28/2012    Procedure: LEFT  HEART CATHETERIZATION WITH CORONARY ANGIOGRAM;  Surgeon: Lesleigh NoeHenry W Smith III, MD;  [facility];  Service: Cardiovascular;  Laterality: N/A;  . Tracheostomy    . Gastric bypass      Physical  Exam: Intubated and sedated EAC/TMs normal BL. Oral cavity, lips, gums, ororpharynx normal with no masses or lesions. Skin warm and dry. Nasal cavity without polyps or purulence. External nose and ears without masses or lesions. EOMI, PERRLA. She is currently has a #6 Shiley tracheostomy tube in position. There is no evidence of leak around the cuff. The skin overlying the trach tube is slightly macerated due to positioning of her head. There is no obvious infection.   A/P: Trach dependence-there does not appear to be any mechanical issue with this tracheostomy tube. She appears to be ventilating fine mechanically and I do not think that a different tracheostomy tube would improve her outcome. I would recommend simply duoderm of the surrounding skin around the tracheostomy site and repositioning of the head so that she is not flexed over the plastic edge of the tracheostomy tube. This patient was evaluated in the ICU.   Darl Kuss T 11/22/2015 7:25 AM

## 2015-11-22 NOTE — Progress Notes (Signed)
PT Cancellation Note  Patient Details Name: Lisette GrinderHedda McMillian Monroe MRN: [id number] DOB: Jul 02, 1966   Cancelled Treatment:    Reason Eval/Treat Not Completed: Medical issues which prohibited therapy (Per chart review, patient with continued medical decline.  PT order discontinued by physician.  Will sign-off at this time.)   Medora Roorda H. Manson PasseyBrown, PT, DPT, NCS 11/22/2015, 2:44 PM [phone]

## 2015-11-22 NOTE — Progress Notes (Signed)
MEDICATION RELATED CONSULT NOTE - Follow up   Pharmacy Consult for Renal dosing  Allergies  Allergen Reactions  . Contrast Media [Iodinated Diagnostic Agents] Anaphylaxis  . Ampicillin Rash   Patient Measurements: Ht 165ft 9in Height:  (SCALE BROKE) Weight: 224 lb 13.9 oz (102 kg) IBW/kg (Calculated) : 66.2  Vital Signs: Temp: 97.4 F (36.3 C) (01/03 1200) Temp Source: Oral (01/03 1200) BP: 107/68 mmHg (01/03 1200) Pulse Rate: 57 (01/03 1200)  Recent Labs  11/21/15 0147 11/21/15 0434 11/21/15 1026 11/21/15 1318 11/21/15 2012 11/22/15 0925  WBC  --   --  19.9*  --  24.0* 18.1*  HGB  --   --  9.7*  --  10.1* 9.6*  HCT  --   --  30.8*  --  32.1* 30.7*  PLT  --   --  186  --  170 153  APTT  --   --   --   --  41*  --   CREATININE 1.25* 1.42*  --  1.46*  --   --   MG 2.1  --   --  2.1  --   --    Estimated Creatinine Clearance: 58.6 mL/min (by C-G formula based on Cr of 1.46).  Assessment: 50 yo patient on HD. Pharmacy consulted for renal dosing of medications.   Plan:  No medications require adjustment at present. Pharmacy will continue to monitor and dose adjust as needed.     Luisa HartScott Jama Mcmiller, PharmD   11/22/2015

## 2015-11-22 NOTE — Progress Notes (Signed)
PULMONARY / CRITICAL CARE MEDICINE   Name: Courtney [name] MRN: [id number] DOB: 1966-03-17    ADMISSION DATE:  09/07/15  BRIEF HISTORY: 49 AAF who has been in medical facilities (hosp, LTAC, rehab) for 2 yrs following gastric bypass surgery with multiple complications. Now with chronic trach, ESRD, profound debilitation, severe sacral pressure ulcer. Was seemingly making progress and transferred to rehab facility approx one week prior to this admission. She was sent to [facility] ED with AMS and hypotension. Working dx of severe sepsis/septic shock due to infected sacral pressure ulcer. Since admission. Her course is been very complicated with numerous complications including septic shock and GI bleeding. Now with failure to wean from vent and possible MI event, repeat ECHO EF 30-35, possible old MI, severe hypokinesis of the nnterior and anteroseptal myocardium   INTERVAL HISTORY: intermittent V tach on oral amiodarone Now on full vent support TF's to be restarted at low dose Inhaled tobrramycin for resistant pseudomonas S/p blood transfusion yesterday-CBC pending today  Summary of MAJOR EVENTS/TEST RESULTS: Admission 02/07/14-05/07/14 Admission 07/21/14-09/06/14 Discharged to [facility]. Pt had palliative consult at that time, were asked to sign off by husband.  09/23 CT head: NAD 09/23 EEG: no epileptiform activity 09/23 PRBCs for Hgb 6.4 09/24 bedside debridement of sacral wound. Abscess drained 09/25 Off vasopressors. More alert. No distress. Worsening thrombocytopenia. Vanc DC'd 09/29 Dr. Sampson Goon (I.D) excused from the case by patient's husband. 10/02 MRI -multiple infarcts 10/03 tracheal bleeding- transfused platelets 10/03 hospitalist service excused from the case by patient's husband 10/03 Echocardiogram ejection fraction was 55-60%, pulmonary systolic pressure was 39 mmHg 40/98 restart TF's at lower rate, attempt reg HD  10/12 Transferred to med-surg floor. Remains on PCCM  service 10/14 SLP eval: pt unable to tolerate PMV adequately 10/17-will re-attempt PM valve-discussed with Speech therapist 10/18 passed swallow eval-start pureed thick foods no thin liquids-continue NG feeds 10/19 transferred to step down for sepsis/aspiration pneumonia 10/19 cxr shows RLL opacity 10/21 sacral decub debride at bedside by surgery 10/22 started back on vasopressors while on HD 10/27 CT with osteo, R hip fx, unable to identify tip of dubhoff tube - sent for fluoro study 10/28 Ortho consultation: I do not feel that she is a surgical candidate. Therefore, I feel that it would best to manage this fracture nonsurgically and allow it to heal by itself over time, which it should.  10/28 Gen Surg consultation: Due to the lack of free air or free flow of contrast and the peritoneum there is no indication for any surgical intervention on this. Would recommend pulling back the feeding tube 1-2 cm. Would recheck an abdominal film to confirm no pneumoperitoneum in the morning. Absence of any changes okay to continue using Dobbhoff for feeding and medications. 10/28 gastrograffin study: The study confirms that the feeding tube pes perforated through the duodenum and the tip is within a cavity that fills with injected contrast. The cavity does appear walled off 11/4 refuses oral feeds, continue TF's CT reviewed: T8-T9 discitis/osteomyelitis, RT HIP FRACTURE 11/5 placed back on vasopressors, placed back on Vent due to resp acidosis, levophed turned off 11/6 afternoon 11/5 PM Trach changed out due to cuff leak, #6 Shiley cuffed, 11/6 dubhoff occluded, removed and replaced, new dubhoff shows tube in the antral stomach 11/15 refusing to take oral feeds 11/18 placed back on Vent for increased WOB,SOB. 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 10/18/2015; the patient  developed a new left basal pneumonia; with recurrent  sepsis.  10/19/2015; fevers resolved with IV acetaminophen. Tube feeds changed from vital to Jevity.  12/7 CXR with stable b/l opacities.  12/12 elevated K s/p HD-remains on AC mode 12/16-vomiting-hold feeds and re-assess in next 24 hrs 12/17- TF restarted at 1/2 goal (goal = 50cc/per) 12/18- elevated wbc, fever (101.4), recultured, restarted on ceftaz 12/19 Pericardial friction rub. Echocardiogram: LVEF 30-35%, anteroseptal hypokinesis or akinesis c/w anterior wall MI 12/20 Dr Sung Amabile discussed new echocardiogram findings with pt's husband and explained that this likely represents an anterior wall MI sometime in the previous couple of weeks. It was explained that she is not a candidate for any cardiology intervention. Code status was again discussed. Husband expressed understanding that she was not going to survive this illness in any favorable way but continues to request that she be full code status based on what he believes to be her wishes 12/21 vomitied 50cc of TF - AM TF held, restart TF at 3pm @30cc  12/26 Persistent intermittent trach cuff leak. Distal XLT trach requested.  12/27 15 beat NSVT 12/28 patient refused HD and refused Bath time 12/29 D10 infusion for hypoglycemia, TF's on hold due to intolerance 12/30 CODE BLUE  VTACH 01/01 transfuse 2 units of blood 01/02 increased respiratory distress, copious purulent secretions 01/02 LE venous US: No DVT 01/03 Repeat TTE: The cavity size was mildly dilated. Systolic function was moderately to severely reduced. The estimated ejection fraction was in the range of 30% to 35%. Diffuse hypokinesis. Moderate to severe hypokinesis of the anteroseptal, anterior, and apical myocardium. Hypokinesis of the anterior myocardium   INDWELLING DEVICES:: Trach (chronic) placed June 2014 Tunneled R IJ HD cath (chronic) Tunneled L IJ CVL (chronic) L femoral A-line 9/23 >> 9/25  MICRO DATA: History of carbopenem resistant enterococcus and  recurrent c. diff from previous hospitalizations. 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 Pneumoniae, P. Mirabilis, VRE Wound 9/26 >> No growth Blood 9/23 >> NEG CDiff 9/27>>neg Stool Cx 10/15>> negative Cdiff 10/25>>neg Trach Aspirate 10/28>> light growth pseudomonas Blood 10/28 >> 1/2 GPC >>  Sputum cultures obtained 09/22/15 due to mucus plugging>>Pseudomonas BONE TISSUE Cx 11/3>>E.coli (ESBL), k. Pneumonia (daptomycin and septra) Bld Cx 11/27>>negative thus far.  Trach Asp 11/27>> grew out Pseudomonas, and Klebsiella pneumonia. Trach Asp 12/18>>PAN RESISTANT Klebsiella, Pseudomonas Bld Cx 12/18>> NEG 12/26 resp culture >> pseudomonas  ANTIMICROBIALS:  Aztreonam 9/23 >> 9/24 Vanc 9/23 >> 9/25 Vanc 9/26>>9/27 Daptomycin 9/27>> 10/12, 10/28>> off Meropenem 9/23 >> 10/14, 10/28 >> 10/31 Zosyn 11/27,>> 11/30. Levaquin 11/29>> 11/29. Ceftaz 12/1>>12/11, 12/18>> 12/24 Colisitin 12/26 >>  Tigecycline 12/26 >>  Inhaled tobramycin 12/30>>  VITAL SIGNS: Temp:  [93.6 F (34.2 C)-98 F (36.7 C)] 97.4 F (36.3 C) (01/03 1200) Pulse Rate:  [56-69] 57 (01/03 1200) Resp:  [0-33] 21 (01/03 1200) BP: (83-123)/(41-82) 107/68 mmHg (01/03 1200) SpO2:  [83 %-100 %] 100 % (01/03 1200) FiO2 (%):  [40 %-100 %] 50 % (01/03 1344) Weight:  [224 lb 13.9 oz (102 kg)-235 lb 14.3 oz (107 kg)] 224 lb 13.9 oz (102 kg) (01/03 0500) HEMODYNAMICS:   VENTILATOR SETTINGS: Vent Mode:  [-] PRVC FiO2 (%):  [40 %-100 %] 50 % Set Rate:  [14 bmp] 14 bmp Vt Set:  [500 mL-530 mL] 500 mL PEEP:  [5 cmH20-8 cmH20] 8 cmH20 INTAKE / OUTPUT:  Intake/Output Summary (Last 24 hours) at 11/22/15  1350 Last data filed at 11/22/15 1000  Gross per 24 hour  Intake 2135.59 ml  Output  -1000 ml  Net 3135.59 ml    Review of Systems  Unable to perform ROS  Unable to fully obtain, due to trach and critical  illness Physical Exam  Vent dependent Somnolent HEENT WNL, NGT present Trach site clean Diffuse rhonchi Reg with occ extrasystoles, no M Abdomen soft, + BS Ext cool, trace symmetric edema, marked muscle wasting Severe sacral decubitus ulcer-   LABS:  CBC  Recent Labs Lab 11/21/15 1026 11/21/15 2012 11/22/15 0925  WBC 19.9* 24.0* 18.1*  HGB 9.7* 10.1* 9.6*  HCT 30.8* 32.1* 30.7*  PLT 186 170 153   Coag's  Recent Labs Lab 11/21/15 2012  APTT 41*  INR 1.57   BMET  Recent Labs Lab 11/19/15 0630 11/21/15 0147 11/21/15 0434 11/21/15 1318  NA 136 133*  --  131*  K 3.5 3.6  --  3.6  CL 100* 97*  --  96*  CO2 27 28  --  28  BUN 45* 67*  --  72*  CREATININE 1.06* 1.25* 1.42* 1.46*  GLUCOSE 83 133*  --  117*   Electrolytes  Recent Labs Lab 11/18/15 0830  11/19/15 0630 11/21/15 0147 11/21/15 1318  CALCIUM 8.2*  < > 8.1* 8.0* 8.0*  MG 1.7  < > 2.1 2.1 2.1  PHOS 4.1  --   --   --   --   < > = values in this interval not displayed. Sepsis Markers No results for input(s): LATICACIDVEN, PROCALCITON, O2SATVEN in the last 168 hours. ABG  Recent Labs Lab 11/21/15 1745  PHART 7.41  PCO2ART 47  PO2ART 78*   Liver Enzymes  Recent Labs Lab 11/18/15 0830  AST 17  ALT 13*  ALKPHOS 1309*  BILITOT 0.7  ALBUMIN 1.6*   Cardiac Enzymes  Recent Labs Lab 11/21/15 2012 11/22/15 0202 11/22/15 0925  TROPONINI 0.05* 0.16* 0.06*   Glucose  Recent Labs Lab 11/21/15 1133 11/21/15 1628 11/21/15 2016 11/22/15 0202 11/22/15 0731 11/22/15 1132  GLUCAP 109* 104* 99 97 73 103*    Imaging US Venous Img Lower Bilateral  11/22/2015  CLINICAL DATA:  Hypoxia. EXAM: BILATERAL LOWER EXTREMITY VENOUS DOPPLER ULTRASOUND TECHNIQUE: Gray-scale sonography with graded compression, as well as color Doppler and duplex ultrasound were performed to evaluate the lower extremity deep venous systems from the level of the common femoral vein and including the common femoral,  femoral, profunda femoral, popliteal and calf veins including the posterior tibial, peroneal and gastrocnemius veins when visible. The superficial great saphenous vein was also interrogated. Spectral Doppler was utilized to evaluate flow at rest and with distal augmentation maneuvers in the common femoral, femoral and popliteal veins. COMPARISON:  Bilateral lower extremity duplex 11/03/2015, 08/28/2015 FINDINGS: RIGHT LOWER EXTREMITY Common Femoral Vein: No evidence of thrombus. Normal compressibility, respiratory phasicity and response to augmentation. Saphenofemoral Junction: No evidence of thrombus. Normal compressibility and flow on color Doppler imaging. Profunda Femoral Vein: No evidence of thrombus. Normal compressibility and flow on color Doppler imaging. Femoral Vein: No evidence of thrombus. Normal compressibility, respiratory phasicity and response to augmentation. Popliteal Vein: No evidence of thrombus. Normal compressibility, respiratory phasicity and response to augmentation. Calf Veins: Poorly visualized due to edema. No evidence of thrombus. Normal compressibility and flow on color Doppler imaging. Superficial Great Saphenous Vein: No evidence of thrombus. Normal compressibility and flow on color Doppler imaging. Venous Reflux:  None. Other Findings:   Subcutaneous  edema noted. LEFT LOWER EXTREMITY Common Femoral Vein: No evidence of thrombus. Normal compressibility, respiratory phasicity and response to augmentation. Saphenofemoral Junction: No evidence of thrombus. Normal compressibility and flow on color Doppler imaging. Profunda Femoral Vein: No evidence of thrombus. Normal compressibility and flow on color Doppler imaging. Femoral Vein: No evidence of thrombus. Normal compressibility, respiratory phasicity and response to augmentation. Popliteal Vein: No evidence of thrombus. Normal compressibility, respiratory phasicity and response to augmentation. Calf Veins: Limited due to subcutaneous  edema. No evidence of thrombus. Normal compressibility and flow on color Doppler imaging. Superficial Great Saphenous Vein: No evidence of thrombus. Normal compressibility and flow on color Doppler imaging. Venous Reflux:  None. Other Findings:  Subcutaneous edema noted. IMPRESSION: No evidence of bilateral lower extremity deep venous thrombosis. Soft tissue edema is seen. Electronically Signed   By: Rubye OaksMelanie  Ehinger M.D.   On: 11/22/2015 00:34   Dg Chest Port 1 View  11/21/2015  CLINICAL DATA:  Acute on chronic respiratory failure. EXAM: PORTABLE CHEST 1 VIEW COMPARISON:  November 15, 2015. FINDINGS: Stable cardiomegaly. No pneumothorax is noted. Tracheostomy and feeding tube are unchanged in position. Right internal jugular dialysis catheter is unchanged with distal tip in expected position of cavoatrial junction. Stable bilateral lung opacities are noted concerning for pneumonia or edema. Minimal bilateral pleural effusions are noted. Bony thorax is unremarkable. IMPRESSION: Stable support apparatus. Stable bilateral lung opacities are noted concerning for edema or pneumonia. Minimal bilateral pleural effusions are noted as well. Electronically Signed   By: Lupita RaiderJames  Green Jr, M.D.   On: 11/21/2015 17:48    ASSESSMENT / PLAN: IMPRESSION: Very prolonged and complicated hospitalization after gastric bypass surgery 2014 Never returned home ESRD Recurrent severe sepsis - PAN resistant Klebisella in sputum Recurrent PNA-now with pseudomonas H/O C diff H/O VTE DM2 - controlled Episodic hypoglycemia Chronic steroid therapy, for a history of of adrenal insufficiency Incidental finding of R hip fx - conservative mgmt. 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 Husband declined  TEE Profound deconditioning Chronic pain New finding of pericardial friction rub 12/19 - resolved New finding of cardiomyopathy, likely post MI 12/19  PAF/flutter Sinus bradycardia HCAP with MDR Donalee CitrinKlebs and pseudomonas Chronic anemia  Cont vent support - settings reviewed and/or adjusted Cont vent bundle Daily SBT if/when meets criteria Cont HD per renal Monitor BMET intermittently Monitor I/Os Correct electrolytes as indicated DVT px: SQ heparin Monitor CBC intermittently Transfuse per usual guidelines Cont TF's at low rate Monitor temp, WBC count Micro and abx as above ID following - appreciate recs Amiodarone dose adjusted - 100 mg per tube daily Increase opiates Decrease D10 Infusion for hypoglycemia     Prognosis remains very poor without hope for functional recovery. I spoke with husband again 01/03 and again recommend comfort care measures. He continues to believe that she desires full aggressive care  CCM time: 45 mins The above time includes time spent in consultation with patient and/or family members and reviewing care plan on multidisciplinary rounds  Billy Fischeravid Gopal Malter, MD PCCM service Mobile [phone] Pager [phone]

## 2015-11-22 NOTE — Progress Notes (Signed)
Subjective:   Hemodialysis yesterday. Tolerated treatment well. UF of 1 litre  Objective:  Vital signs in last 24 hours:  Temp:  [93.6 F (34.2 C)-98 F (36.7 C)] 97.7 F (36.5 C) (01/03 0800) Pulse Rate:  [56-69] 62 (01/03 0800) Resp:  [0-33] 24 (01/03 0800) BP: (83-123)/(41-84) 109/60 mmHg (01/03 0800) SpO2:  [83 %-100 %] 100 % (01/03 0800) FiO2 (%):  [40 %-100 %] 70 % (01/03 0800) Weight:  [102 kg (224 lb 13.9 oz)-107 kg (235 lb 14.3 oz)] 102 kg (224 lb 13.9 oz) (01/03 0500)  Weight change: 0 kg (0 lb) Filed Weights   11/21/15 0500 11/21/15 1540 11/22/15 0500  Weight: 107 kg (235 lb 14.3 oz) 107 kg (235 lb 14.3 oz) 102 kg (224 lb 13.9 oz)    Intake/Output: I/O last 3 completed shifts: In: 2952.4 [I.V.:1929.1; Blood:350; NG/GT:270; IV Piggyback:403.3] Out: -1000    Intake/Output this shift:  Total I/O In: 80 [NG/GT:80] Out: -   Physical Exam: General: critically ill appearing  Head/ENT: OM moist, NG tube  Eyes: Anicteric, eyes closed  Neck: Tracheostomy in place  Lungs:  bilatearl rhonchi, vent assisted PRVC fio2 40% PEEP 5  Heart: S1S2 no rubs  Abdomen:  Soft, nontender, BS present  Extremities: 2+ dependent edema, anasarca  Neurologic: Resting comfortably at the moment  Access:  R IJ permcath.       Basic Metabolic Panel:  Recent Labs Lab 11/18/15 0830 11/18/15 1810 11/19/15 0630 11/21/15 0147 11/21/15 0434 11/21/15 1318  NA 139 137 136 133*  --  131*  K 3.7 3.2* 3.5 3.6  --  3.6  CL 101 101 100* 97*  --  96*  CO2 28 30 27 28   --  28  GLUCOSE 109* 105* 83 133*  --  117*  BUN 58* 41* 45* 67*  --  72*  CREATININE 1.29* 0.98 1.06* 1.25* 1.42* 1.46*  CALCIUM 8.2* 8.1* 8.1* 8.0*  --  8.0*  MG 1.7 1.6* 2.1 2.1  --  2.1  PHOS 4.1  --   --   --   --   --     Liver Function Tests:  Recent Labs Lab 11/18/15 0830  AST 17  ALT 13*  ALKPHOS 1309*  BILITOT 0.7  PROT 5.6*  ALBUMIN 1.6*   No results for input(s): LIPASE, AMYLASE in the last 168  hours. No results for input(s): AMMONIA in the last 168 hours.  CBC:  Recent Labs Lab 11/18/15 1205 11/19/15 0630 11/20/15 0948 11/21/15 1026 11/21/15 2012  WBC 31.3* 20.5* 20.7* 19.9* 24.0*  NEUTROABS  --  17.2* 18.2* 17.1*  --   HGB 8.7* 7.5* 7.3* 9.7* 10.1*  HCT 29.0* 24.6* 24.8* 30.8* 32.1*  MCV 95.0 94.5 94.0 91.5 91.2  PLT 280 229 229 186 170    Cardiac Enzymes:  Recent Labs Lab 11/18/15 1810 11/19/15 0031 11/19/15 0630 11/21/15 2012 11/22/15 0202  TROPONINI 0.06* 0.05* 0.13* 0.05* 0.16*    BNP: Invalid input(s): POCBNP  CBG:  Recent Labs Lab 11/21/15 1133 11/21/15 1628 11/21/15 2016 11/22/15 0202 11/22/15 0731  GLUCAP 109* 104* 99 97 73    Microbiology: Results for orders placed or performed during the hospital encounter of 08-Sep-2015  Blood Culture (routine x 2)     Status: None   Collection Time: 08-Sep-2015  8:51 AM  Result Value Ref Range Status   Specimen Description BLOOD [name]  Final   Special Requests BOTTLES DRAWN AEROBIC AND ANAEROBIC  3CC  Final  Culture NO GROWTH 5 DAYS  Final   Report Status 08/17/2015 FINAL  Final  Blood Culture (routine x 2)     Status: None   Collection Time: 15-Aug-2015  9:20 AM  Result Value Ref Range Status   Specimen Description BLOOD LEFT ARM  Final   Special Requests BOTTLES DRAWN AEROBIC AND ANAEROBIC  1CC  Final   Culture NO GROWTH 5 DAYS  Final   Report Status 08/17/2015 FINAL  Final  Wound culture     Status: None   Collection Time: 08/15/2015  9:20 AM  Result Value Ref Range Status   Specimen Description DECUBITIS  Final   Special Requests Normal  Final   Gram Stain   Final    FEW WBC SEEN MANY GRAM NEGATIVE RODS RARE GRAM POSITIVE COCCI    Culture   Final    HEAVY GROWTH ESCHERICHIA COLI MODERATE GROWTH ENTEROBACTER AEROGENES PROTEUS MIRABILIS HEAVY GROWTH ENTEROCOCCUS SPECIES VRE HAVE INTRINSIC RESISTANCE TO MOST COMMONLY USED ANTIBIOTICS AND THE ABILITY TO ACQUIRE RESISTANCE TO MOST AVAILABLE  ANTIBIOTICS.    Report Status 08/16/2015 FINAL  Final   Organism ID, Bacteria ESCHERICHIA COLI  Final   Organism ID, Bacteria ENTEROBACTER AEROGENES  Final   Organism ID, Bacteria PROTEUS MIRABILIS  Final   Organism ID, Bacteria ENTEROCOCCUS SPECIES  Final      Susceptibility   Enterobacter aerogenes - MIC*    CEFTAZIDIME <=1 SENSITIVE Sensitive     CEFAZOLIN >=64 RESISTANT Resistant     CEFTRIAXONE <=1 SENSITIVE Sensitive     CIPROFLOXACIN <=0.25 SENSITIVE Sensitive     GENTAMICIN <=1 SENSITIVE Sensitive     IMIPENEM 1 SENSITIVE Sensitive     TRIMETH/SULFA <=20 SENSITIVE Sensitive     * MODERATE GROWTH ENTEROBACTER AEROGENES   Escherichia coli - MIC*    AMPICILLIN <=2 SENSITIVE Sensitive     CEFTAZIDIME <=1 SENSITIVE Sensitive     CEFAZOLIN <=4 SENSITIVE Sensitive     CEFTRIAXONE <=1 SENSITIVE Sensitive     CIPROFLOXACIN <=0.25 SENSITIVE Sensitive     GENTAMICIN <=1 SENSITIVE Sensitive     IMIPENEM <=0.25 SENSITIVE Sensitive     TRIMETH/SULFA <=20 SENSITIVE Sensitive     * HEAVY GROWTH ESCHERICHIA COLI   Proteus mirabilis - MIC*    AMPICILLIN >=32 RESISTANT Resistant     CEFTAZIDIME <=1 SENSITIVE Sensitive     CEFAZOLIN 8 SENSITIVE Sensitive     CEFTRIAXONE <=1 SENSITIVE Sensitive     CIPROFLOXACIN <=0.25 SENSITIVE Sensitive     GENTAMICIN <=1 SENSITIVE Sensitive     IMIPENEM 1 SENSITIVE Sensitive     TRIMETH/SULFA <=20 SENSITIVE Sensitive     * PROTEUS MIRABILIS   Enterococcus species - MIC*    AMPICILLIN >=32 RESISTANT Resistant     VANCOMYCIN >=32 RESISTANT Resistant     GENTAMICIN SYNERGY SENSITIVE Sensitive     TETRACYCLINE Value in next row Resistant      RESISTANT>=16    * HEAVY GROWTH ENTEROCOCCUS SPECIES  MRSA PCR Screening     Status: None   Collection Time: August 15, 2015  2:38 PM  Result Value Ref Range Status   MRSA by PCR NEGATIVE NEGATIVE Final    Comment:        The GeneXpert MRSA Assay (FDA approved for NASAL specimens only), is one component of  a comprehensive MRSA colonization surveillance program. It is not intended to diagnose MRSA infection nor to guide or monitor treatment for MRSA infections.   Blood culture (single)  Status: None   Collection Time: 08/03/2015  3:36 PM  Result Value Ref Range Status   Specimen Description BLOOD RIGHT ASSIST CONTROL  Final   Special Requests BOTTLES DRAWN AEROBIC AND ANAEROBIC  Final   Culture NO GROWTH 5 DAYS  Final   Report Status 08/17/2015 FINAL  Final  Wound culture     Status: None   Collection Time: 08/13/15 12:37 PM  Result Value Ref Range Status   Specimen Description WOUND  Final   Special Requests Normal  Final   Gram Stain   Final    FEW WBC SEEN TOO NUMEROUS TO COUNT GRAM NEGATIVE RODS FEW GRAM POSITIVE COCCI    Culture   Final    HEAVY GROWTH ESCHERICHIA COLI MODERATE GROWTH PROTEUS MIRABILIS LIGHT GROWTH KLEBSIELLA PNEUMONIAE MODERATE GROWTH ENTEROCOCCUS GALLINARUM CRITICAL RESULT CALLED TO, READ BACK BY AND VERIFIED WITH: [facility] BORBA AT 1042 08/16/15 DV    Report Status 08/17/2015 FINAL  Final   Organism ID, Bacteria ESCHERICHIA COLI  Final   Organism ID, Bacteria PROTEUS MIRABILIS  Final   Organism ID, Bacteria KLEBSIELLA PNEUMONIAE  Final   Organism ID, Bacteria ENTEROCOCCUS GALLINARUM  Final      Susceptibility   Escherichia coli - MIC*    AMPICILLIN >=32 RESISTANT Resistant     CEFTAZIDIME 4 RESISTANT Resistant     CEFAZOLIN >=64 RESISTANT Resistant     CEFTRIAXONE 16 RESISTANT Resistant     GENTAMICIN 2 SENSITIVE Sensitive     IMIPENEM >=16 RESISTANT Resistant     TRIMETH/SULFA <=20 SENSITIVE Sensitive     Extended ESBL POSITIVE Resistant     PIP/TAZO Value in next row Resistant      RESISTANT>=128    CIPROFLOXACIN Value in next row Sensitive      SENSITIVE<=0.25    * HEAVY GROWTH ESCHERICHIA COLI   Klebsiella pneumoniae - MIC*    AMPICILLIN Value in next row Resistant      SENSITIVE<=0.25    CEFTAZIDIME Value in next row Resistant       SENSITIVE<=0.25    CEFAZOLIN Value in next row Resistant      SENSITIVE<=0.25    CEFTRIAXONE Value in next row Resistant      SENSITIVE<=0.25    CIPROFLOXACIN Value in next row Resistant      SENSITIVE<=0.25    GENTAMICIN Value in next row Sensitive      SENSITIVE<=0.25    IMIPENEM Value in next row Resistant      SENSITIVE<=0.25    TRIMETH/SULFA Value in next row Resistant      SENSITIVE<=0.25    PIP/TAZO Value in next row Resistant      RESISTANT>=128    * LIGHT GROWTH KLEBSIELLA PNEUMONIAE   Proteus mirabilis - MIC*    AMPICILLIN Value in next row Resistant      RESISTANT>=128    CEFTAZIDIME Value in next row Sensitive      RESISTANT>=128    CEFAZOLIN Value in next row Sensitive      RESISTANT>=128    CEFTRIAXONE Value in next row Sensitive      RESISTANT>=128    CIPROFLOXACIN Value in next row Sensitive      RESISTANT>=128    GENTAMICIN Value in next row Sensitive      RESISTANT>=128    IMIPENEM Value in next row Sensitive      RESISTANT>=128    TRIMETH/SULFA Value in next row Sensitive      RESISTANT>=128    PIP/TAZO Value in next row  Sensitive      SENSITIVE<=4    * MODERATE GROWTH PROTEUS MIRABILIS   Enterococcus gallinarum - MIC*    AMPICILLIN Value in next row Resistant      SENSITIVE<=4    GENTAMICIN SYNERGY Value in next row Sensitive      SENSITIVE<=4    CIPROFLOXACIN Value in next row Resistant      RESISTANT>=8    TETRACYCLINE Value in next row Resistant      RESISTANT>=16    * MODERATE GROWTH ENTEROCOCCUS GALLINARUM  Wound culture     Status: None   Collection Time: 08/15/15  2:53 PM  Result Value Ref Range Status   Specimen Description WOUND  Final   Special Requests NONE  Final   Gram Stain FEW WBC SEEN NO ORGANISMS SEEN   Final   Culture NO GROWTH 3 DAYS  Final   Report Status 08/18/2015 FINAL  Final  C difficile quick scan w PCR reflex     Status: None   Collection Time: 08/17/15 11:34 AM  Result Value Ref Range Status   C Diff antigen  NEGATIVE NEGATIVE Final   C Diff toxin NEGATIVE NEGATIVE Final   C Diff interpretation Negative for C. difficile  Final  Stool culture     Status: None   Collection Time: 09/03/15  3:51 PM  Result Value Ref Range Status   Specimen Description STOOL  Final   Special Requests Immunocompromised  Final   Culture   Final    NO SALMONELLA OR SHIGELLA ISOLATED No Pathogenic E. coli detected NO CAMPYLOBACTER DETECTED    Report Status 09/07/2015 FINAL  Final  C difficile quick scan w PCR reflex     Status: None   Collection Time: 09/13/15 12:51 AM  Result Value Ref Range Status   C Diff antigen NEGATIVE NEGATIVE Final   C Diff toxin NEGATIVE NEGATIVE Final   C Diff interpretation Negative for C. difficile  Final  Culture, blood (routine x 2)     Status: None   Collection Time: 09/16/15 11:24 AM  Result Value Ref Range Status   Specimen Description BLOOD LEFT HAND  Final   Special Requests BOTTLES DRAWN AEROBIC AND ANAEROBIC  1CC  Final   Culture NO GROWTH 5 DAYS  Final   Report Status 09/21/2015 FINAL  Final  Culture, blood (routine x 2)     Status: None   Collection Time: 09/16/15 12:23 PM  Result Value Ref Range Status   Specimen Description BLOOD RIGHT HAND  Final   Special Requests BOTTLES DRAWN AEROBIC AND ANAEROBIC  1CC  Final   Culture  Setup Time   Final    GRAM POSITIVE COCCI AEROBIC BOTTLE ONLY CRITICAL RESULT CALLED TO, READ BACK BY AND VERIFIED WITH: TESS THOMAS,RN 09/17/2015 0631 BY JRS.    Culture   Final    ENTEROCOCCUS FAECALIS AEROBIC BOTTLE ONLY VRE HAVE INTRINSIC RESISTANCE TO MOST COMMONLY USED ANTIBIOTICS AND THE ABILITY TO ACQUIRE RESISTANCE TO MOST AVAILABLE ANTIBIOTICS. CRITICAL RESULT CALLED TO, READ BACK BY AND VERIFIED WITH: CHERYL SMITH AT 1610 09/19/15 DV    Report Status 09/21/2015 FINAL  Final   Organism ID, Bacteria ENTEROCOCCUS FAECALIS  Final      Susceptibility   Enterococcus faecalis - MIC*    AMPICILLIN <=2 SENSITIVE Sensitive     LINEZOLID  2 SENSITIVE Sensitive     CIPROFLOXACIN Value in next row Resistant      RESISTANT>=8    TETRACYCLINE Value in next row Resistant  RESISTANT>=16    VANCOMYCIN Value in next row Resistant      RESISTANT>=32    GENTAMICIN SYNERGY Value in next row Resistant      RESISTANT>=32    * ENTEROCOCCUS FAECALIS  Culture, respiratory (NON-Expectorated)     Status: None   Collection Time: 09/16/15  3:50 PM  Result Value Ref Range Status   Specimen Description TRACHEAL ASPIRATE  Final   Special Requests Immunocompromised  Final   Gram Stain   Final    FEW WBC SEEN GOOD SPECIMEN - 80-90% WBCS RARE GRAM NEGATIVE RODS    Culture LIGHT GROWTH PSEUDOMONAS AERUGINOSA  Final   Report Status 09/19/2015 FINAL  Final   Organism ID, Bacteria PSEUDOMONAS AERUGINOSA  Final      Susceptibility   Pseudomonas aeruginosa - MIC*    CEFTAZIDIME 8 SENSITIVE Sensitive     CIPROFLOXACIN 2 INTERMEDIATE Intermediate     GENTAMICIN >=16 RESISTANT Resistant     IMIPENEM >=16 RESISTANT Resistant     PIP/TAZO Value in next row Sensitive      SENSITIVE32    CEFEPIME Value in next row Sensitive      SENSITIVE8    LEVOFLOXACIN Value in next row Resistant      RESISTANT>=8    * LIGHT GROWTH PSEUDOMONAS AERUGINOSA  Culture, expectorated sputum-assessment     Status: None   Collection Time: 09/22/15  2:09 PM  Result Value Ref Range Status   Specimen Description ENDOTRACHEAL  Final   Special Requests Normal  Final   Sputum evaluation THIS SPECIMEN IS ACCEPTABLE FOR SPUTUM CULTURE  Final   Report Status 09/24/2015 FINAL  Final  Culture, respiratory (NON-Expectorated)     Status: None   Collection Time: 09/22/15  2:09 PM  Result Value Ref Range Status   Specimen Description ENDOTRACHEAL  Final   Special Requests Normal Reflexed from [id number]  Final   Gram Stain   Final    FEW WBC SEEN FEW GRAM NEGATIVE RODS POOR SPECIMEN - LESS THAN 70% WBCS    Culture   Final    MODERATE GROWTH PSEUDOMONAS AERUGINOSA LIGHT  GROWTH KLEBSIELLA PNEUMONIAE REFER TO SENSITIVITIES FROM PREVIOUS CULTURE FOR ORG 2    Report Status 10/01/2015 FINAL  Final   Organism ID, Bacteria PSEUDOMONAS AERUGINOSA  Final   Organism ID, Bacteria KLEBSIELLA PNEUMONIAE  Final      Susceptibility   Pseudomonas aeruginosa - MIC*    CEFTAZIDIME 4 SENSITIVE Sensitive     CIPROFLOXACIN 2 INTERMEDIATE Intermediate     GENTAMICIN >=16 RESISTANT Resistant     IMIPENEM >=16 RESISTANT Resistant     PIP/TAZO Value in next row Sensitive      SENSITIVE16    * MODERATE GROWTH PSEUDOMONAS AERUGINOSA  Tissue culture     Status: None   Collection Time: 09/24/15  6:44 AM  Result Value Ref Range Status   Specimen Description BONE  Final   Special Requests Normal  Final   Gram Stain MODERATE WBC SEEN FEW GRAM NEGATIVE RODS   Final   Culture   Final    MODERATE GROWTH ESCHERICHIA COLI MODERATE GROWTH KLEBSIELLA PNEUMONIAE ESBL-EXTENDED SPECTRUM BETA LACTAMASE-THE ORGANISM IS RESISTANT TO PENICILLINS, CEPHALOSPORINS AND AZTREONAM ACCORDING TO CLSI M100-S15 VOL.25 N01 JAN 2005. ORGANISM 1 This organism isolate is resistant to one or more antiotic agents in three or more antimicrobial categories.  Suggest Infectious Disease consult.   ORGANISM 2 CRITICAL RESULT CALLED TO, READ BACK BY AND VERIFIED WITH: RN Mardene Celeste [facility] 09/27/15 1005AM  Report Status 09/28/2015 FINAL  Final   Organism ID, Bacteria ESCHERICHIA COLI  Final   Organism ID, Bacteria KLEBSIELLA PNEUMONIAE  Final      Susceptibility   Escherichia coli - MIC*    AMPICILLIN >=32 RESISTANT Resistant     CEFTAZIDIME 4 RESISTANT Resistant     CEFAZOLIN >=64 RESISTANT Resistant     CEFTRIAXONE 8 RESISTANT Resistant     GENTAMICIN <=1 SENSITIVE Sensitive     IMIPENEM 8 RESISTANT Resistant     TRIMETH/SULFA <=20 SENSITIVE Sensitive     Extended ESBL POSITIVE Resistant     PIP/TAZO Value in next row Resistant      RESISTANT>=128    * MODERATE GROWTH ESCHERICHIA COLI   Klebsiella  pneumoniae - MIC*    AMPICILLIN Value in next row Resistant      RESISTANT>=128    CEFTAZIDIME Value in next row Resistant      RESISTANT>=128    CEFAZOLIN Value in next row Resistant      RESISTANT>=128    CEFTRIAXONE Value in next row Resistant      RESISTANT>=128    CIPROFLOXACIN Value in next row Resistant      RESISTANT>=128    GENTAMICIN Value in next row Resistant      RESISTANT>=128    IMIPENEM Value in next row Resistant      RESISTANT>=128    TRIMETH/SULFA Value in next row Sensitive      RESISTANT>=128    PIP/TAZO Value in next row Resistant      RESISTANT>=128    * MODERATE GROWTH KLEBSIELLA PNEUMONIAE  Anaerobic culture     Status: None   Collection Time: 09/24/15  3:55 PM  Result Value Ref Range Status   Specimen Description BONE  Final   Special Requests Normal  Final   Culture NO ANAEROBES ISOLATED  Final   Report Status 09/29/2015 FINAL  Final  Culture, fungus without smear     Status: None   Collection Time: 09/24/15  3:55 PM  Result Value Ref Range Status   Specimen Description BONE  Final   Special Requests Normal  Final   Culture NO FUNGUS ISOLATED AFTER 24 DAYS  Final   Report Status 10/18/2015 FINAL  Final  C difficile quick scan w PCR reflex     Status: None   Collection Time: 10/07/15  2:02 PM  Result Value Ref Range Status   C Diff antigen NEGATIVE NEGATIVE Final   C Diff toxin NEGATIVE NEGATIVE Final   C Diff interpretation Negative for C. difficile  Final  Culture, blood (routine x 2)     Status: None   Collection Time: 10/16/15  9:52 AM  Result Value Ref Range Status   Specimen Description BLOOD LEFT HAND  Final   Special Requests   Final    BOTTLES DRAWN AEROBIC AND ANAEROBIC  AER 6CC ANA 4CC   Culture NO GROWTH 6 DAYS  Final   Report Status 10/22/2015 FINAL  Final  Culture, blood (routine x 2)     Status: None   Collection Time: 10/16/15 12:25 PM  Result Value Ref Range Status   Specimen Description BLOOD LEFT HAND  Final   Special  Requests BOTTLES DRAWN AEROBIC AND ANAEROBIC  5CC  Final   Culture NO GROWTH 6 DAYS  Final   Report Status 10/22/2015 FINAL  Final  Culture, expectorated sputum-assessment     Status: None   Collection Time: 10/18/15  1:15 PM  Result Value Ref Range Status  Specimen Description SPUTUM  Final   Special Requests NONE  Final   Sputum evaluation THIS SPECIMEN IS ACCEPTABLE FOR SPUTUM CULTURE  Final   Report Status 10/18/2015 FINAL  Final  Culture, respiratory (NON-Expectorated)     Status: None   Collection Time: 10/18/15  1:15 PM  Result Value Ref Range Status   Specimen Description SPUTUM  Final   Special Requests NONE Reflexed from [id number]  Final   Gram Stain   Final    GOOD SPECIMEN - 80-90% WBCS MODERATE WBC SEEN MANY GRAM NEGATIVE RODS    Culture   Final    HEAVY GROWTH PSEUDOMONAS AERUGINOSA MODERATE GROWTH KLEBSIELLA PNEUMONIAE CRITICAL RESULT CALLED TO, READ BACK BY AND VERIFIED WITHRod Mae AT 1610 10/21/15 DV KLEBSIELLA PNEUMONIAE This organism isolate is resistant to one or more antiotic agents in three or more antimicrobial categories.  Suggest Infectious Disease  consult.      Report Status 10/22/2015 FINAL  Final   Organism ID, Bacteria PSEUDOMONAS AERUGINOSA  Final   Organism ID, Bacteria KLEBSIELLA PNEUMONIAE  Final      Susceptibility   Klebsiella pneumoniae - MIC*    AMPICILLIN >=32 RESISTANT Resistant     CEFAZOLIN >=64 RESISTANT Resistant     CEFTRIAXONE >=64 RESISTANT Resistant     CIPROFLOXACIN >=4 RESISTANT Resistant     GENTAMICIN >=16 RESISTANT Resistant     IMIPENEM >=16 RESISTANT Resistant     NITROFURANTOIN >=512 RESISTANT Resistant     TRIMETH/SULFA <=20 SENSITIVE Sensitive     * MODERATE GROWTH KLEBSIELLA PNEUMONIAE   Pseudomonas aeruginosa - MIC*    CEFTAZIDIME 4 SENSITIVE Sensitive     CIPROFLOXACIN >=4 RESISTANT Resistant     GENTAMICIN 8 INTERMEDIATE Intermediate     IMIPENEM >=16 RESISTANT Resistant     PIP/TAZO Value in next row  Sensitive      SENSITIVE32    * HEAVY GROWTH PSEUDOMONAS AERUGINOSA  C difficile quick scan w PCR reflex     Status: None   Collection Time: 10/18/15  4:39 PM  Result Value Ref Range Status   C Diff antigen NEGATIVE NEGATIVE Final   C Diff toxin NEGATIVE NEGATIVE Final   C Diff interpretation Negative for C. difficile  Final  Culture, blood (Routine X 2) w Reflex to ID Panel     Status: None   Collection Time: 11/06/15  8:27 AM  Result Value Ref Range Status   Specimen Description BLOOD RIGHT HAND  Final   Special Requests BOTTLES DRAWN AEROBIC AND ANAEROBIC 3CC  Final   Culture NO GROWTH 5 DAYS  Final   Report Status 11/11/2015 FINAL  Final  Culture, blood (Routine X 2) w Reflex to ID Panel     Status: None   Collection Time: 11/06/15  8:29 AM  Result Value Ref Range Status   Specimen Description BLOOD LEFT HAND  Final   Special Requests BOTTLES DRAWN AEROBIC AND ANAEROBIC  1CC  Final   Culture NO GROWTH 5 DAYS  Final   Report Status 11/11/2015 FINAL  Final  Culture, expectorated sputum-assessment     Status: None   Collection Time: 11/06/15  8:41 AM  Result Value Ref Range Status   Specimen Description EXPECTORATED SPUTUM  Final   Special Requests Immunocompromised  Final   Sputum evaluation THIS SPECIMEN IS ACCEPTABLE FOR SPUTUM CULTURE  Final   Report Status 11/06/2015 FINAL  Final  Culture, respiratory (NON-Expectorated)     Status: None   Collection Time: 11/06/15  8:41 AM  Result Value Ref Range Status   Specimen Description EXPECTORATED SPUTUM  Final   Special Requests Immunocompromised Reflexed from [id number]  Final   Gram Stain   Final    MANY WBC SEEN MANY GRAM NEGATIVE RODS EXCELLENT SPECIMEN - 90-100% WBCS    Culture   Final    MODERATE GROWTH KLEBSIELLA PNEUMONIAE MODERATE GROWTH PSEUDOMONAS AERUGINOSA CRITICAL RESULT CALLED TO, READ BACK BY AND VERIFIED WITH: DR. FITZGERALD AT 1120 11/09/15 DV DR. FITZGERALD REQUESTED ORGANISM BE SENT OUT FOR TIGECYCLINE  CEFT/AVIBACTAM AND COLISTIN Sent to [facility] for further susceptibility testing. 11/11/15 CTJ    Report Status 11/15/2015 FINAL  Final   Organism ID, Bacteria KLEBSIELLA PNEUMONIAE  Final   Organism ID, Bacteria PSEUDOMONAS AERUGINOSA  Final      Susceptibility   Klebsiella pneumoniae - MIC*    AMPICILLIN >=32 RESISTANT Resistant     CEFAZOLIN >=64 RESISTANT Resistant     CEFTRIAXONE 32 RESISTANT Resistant     CIPROFLOXACIN >=4 RESISTANT Resistant     GENTAMICIN >=16 RESISTANT Resistant     IMIPENEM >=16 RESISTANT Resistant     NITROFURANTOIN 256 RESISTANT Resistant     TRIMETH/SULFA >=320 RESISTANT Resistant     Extended ESBL POSITIVE Resistant     * MODERATE GROWTH KLEBSIELLA PNEUMONIAE   Pseudomonas aeruginosa - MIC*    CEFTAZIDIME >=64 RESISTANT Resistant     CIPROFLOXACIN 2 INTERMEDIATE Intermediate     GENTAMICIN >=16 RESISTANT Resistant     IMIPENEM >=16 RESISTANT Resistant     * MODERATE GROWTH PSEUDOMONAS AERUGINOSA  Culture, respiratory (NON-Expectorated)     Status: None   Collection Time: 11/14/15  2:07 PM  Result Value Ref Range Status   Specimen Description TRACHEAL ASPIRATE  Final   Special Requests NONE  Final   Gram Stain   Final    EXCELLENT SPECIMEN - 90-100% WBCS MANY WBC SEEN MODERATE GRAM NEGATIVE RODS    Culture   Final    HEAVY GROWTH PSEUDOMONAS AERUGINOSA CRITICAL VALUE NOTED.  VALUE IS CONSISTENT WITH PREVIOUSLY REPORTED AND CALLED VALUE.    Report Status 11/16/2015 FINAL  Final   Organism ID, Bacteria PSEUDOMONAS AERUGINOSA  Final      Susceptibility   Pseudomonas aeruginosa - MIC*    CEFTAZIDIME >=64 RESISTANT Resistant     CIPROFLOXACIN 2 INTERMEDIATE Intermediate     GENTAMICIN >=16 RESISTANT Resistant     IMIPENEM >=16 RESISTANT Resistant     PIP/TAZO Value in next row Resistant      RESISTANT>=128    TOBRAMYCIN Value in next row Sensitive      SENSITIVE2    * HEAVY GROWTH PSEUDOMONAS AERUGINOSA    Coagulation Studies:  Recent  Labs  11/21/15 04/25/11  LABPROT 18.8*  INR 1.57    Urinalysis: No results for input(s): COLORURINE, LABSPEC, PHURINE, GLUCOSEU, HGBUR, BILIRUBINUR, KETONESUR, PROTEINUR, UROBILINOGEN, NITRITE, LEUKOCYTESUR in the last 72 hours.  Invalid input(s): APPERANCEUR    Imaging: US Venous Img Lower Bilateral  11/22/2015  CLINICAL DATA:  Hypoxia. EXAM: BILATERAL LOWER EXTREMITY VENOUS DOPPLER ULTRASOUND TECHNIQUE: Gray-scale sonography with graded compression, as well as color Doppler and duplex ultrasound were performed to evaluate the lower extremity deep venous systems from the level of the common femoral vein and including the common femoral, femoral, profunda femoral, popliteal and calf veins including the posterior tibial, peroneal and gastrocnemius veins when visible. The superficial great saphenous vein was also interrogated. Spectral Doppler was utilized to evaluate flow at rest and with distal  augmentation maneuvers in the common femoral, femoral and popliteal veins. COMPARISON:  Bilateral lower extremity duplex 11/03/2015, 08/28/2015 FINDINGS: RIGHT LOWER EXTREMITY Common Femoral Vein: No evidence of thrombus. Normal compressibility, respiratory phasicity and response to augmentation. Saphenofemoral Junction: No evidence of thrombus. Normal compressibility and flow on color Doppler imaging. Profunda Femoral Vein: No evidence of thrombus. Normal compressibility and flow on color Doppler imaging. Femoral Vein: No evidence of thrombus. Normal compressibility, respiratory phasicity and response to augmentation. Popliteal Vein: No evidence of thrombus. Normal compressibility, respiratory phasicity and response to augmentation. Calf Veins: Poorly visualized due to edema. No evidence of thrombus. Normal compressibility and flow on color Doppler imaging. Superficial Great Saphenous Vein: No evidence of thrombus. Normal compressibility and flow on color Doppler imaging. Venous Reflux:  None. Other Findings:    Subcutaneous edema noted. LEFT LOWER EXTREMITY Common Femoral Vein: No evidence of thrombus. Normal compressibility, respiratory phasicity and response to augmentation. Saphenofemoral Junction: No evidence of thrombus. Normal compressibility and flow on color Doppler imaging. Profunda Femoral Vein: No evidence of thrombus. Normal compressibility and flow on color Doppler imaging. Femoral Vein: No evidence of thrombus. Normal compressibility, respiratory phasicity and response to augmentation. Popliteal Vein: No evidence of thrombus. Normal compressibility, respiratory phasicity and response to augmentation. Calf Veins: Limited due to subcutaneous edema. No evidence of thrombus. Normal compressibility and flow on color Doppler imaging. Superficial Great Saphenous Vein: No evidence of thrombus. Normal compressibility and flow on color Doppler imaging. Venous Reflux:  None. Other Findings:  Subcutaneous edema noted. IMPRESSION: No evidence of bilateral lower extremity deep venous thrombosis. Soft tissue edema is seen. Electronically Signed   By: Rubye Oaks M.D.   On: 11/22/2015 00:34   Dg Chest Port 1 View  11/21/2015  CLINICAL DATA:  Acute on chronic respiratory failure. EXAM: PORTABLE CHEST 1 VIEW COMPARISON:  November 15, 2015. FINDINGS: Stable cardiomegaly. No pneumothorax is noted. Tracheostomy and feeding tube are unchanged in position. Right internal jugular dialysis catheter is unchanged with distal tip in expected position of cavoatrial junction. Stable bilateral lung opacities are noted concerning for pneumonia or edema. Minimal bilateral pleural effusions are noted. Bony thorax is unremarkable. IMPRESSION: Stable support apparatus. Stable bilateral lung opacities are noted concerning for edema or pneumonia. Minimal bilateral pleural effusions are noted as well. Electronically Signed   By: Lupita Raider, M.D.   On: 11/21/2015 17:48     Medications:   . dextrose 50 mL/hr at 11/20/15 0700  .  feeding supplement (JEVITY 1.5 CAL/FIBER) Stopped (11/22/15 0200)  . heparin 1,700 Units/hr (11/22/15 4034)  . norepinephrine (LEVOPHED) Adult infusion Stopped (11/19/15 1043)   . albumin human  12.5 g Intravenous Once  . amiodarone  200 mg Per Tube BID   Followed by  . [START ON 11/25/2015] amiodarone  200 mg Per Tube Daily  . antiseptic oral rinse  7 mL Mouth Rinse QID  . aspirin  81 mg Oral Daily  . chlorhexidine gluconate  15 mL Mouth Rinse BID  . colistimethate (COLISTIN) IV  250 mg Intravenous Q48H  . epoetin (EPOGEN/PROCRIT) injection  10,000 Units Intravenous Q M,W,F-HD  . escitalopram  10 mg Oral Daily  . feeding supplement (PRO-STAT SUGAR FREE 64)  30 mL Oral 6 X Daily  . free water  20 mL Per Tube 6 times per day  . hydrocortisone  10 mg Per Tube BID  . lidocaine  1 patch Transdermal Q24H  . metoprolol tartrate  12.5 mg Oral BID  . midodrine  10 mg Per Tube TID WC  . mirtazapine  7.5 mg Oral QHS  . multivitamin  5 mL Oral Daily  . pantoprazole sodium  40 mg Per Tube Daily  . saccharomyces boulardii  250 mg Oral BID  . sodium chloride  10-40 mL Intracatheter Q12H  . sodium hypochlorite   Irrigation BID  . tigecycline (TYGACIL) IVPB  50 mg Intravenous Q12H  . tobramycin (PF)  300 mg Nebulization BID   acetaminophen (TYLENOL) oral liquid 160 mg/5 mL, HYDROmorphone (DILAUDID) injection, ipratropium-albuterol, loperamide, morphine injection, ondansetron (ZOFRAN) IV, oxyCODONE, polyvinyl alcohol, sodium chloride, zinc oxide  Assessment/ Plan:  50 y.o. 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 Admitted on Aug 19, 2015  Echo: 11/07/19: EF of 30% to 35%.   1. End-stage renal disease on hemodialysis on HD MWF. The patient has been on dialysis since October of 2014. R IJ permcath.  - Dialysis treatment with IV albumin for oncotic support.  - Midodrine and hydrocortisone ordered for blood pressure support.  - Continue MWF schedule, monitor daily for dailysis treatment. Next treatment for Wednesday.   2. Anemia of CKD. Hemoglobin 9.6. Status post PRBC transfusion on 1/1 - Epogen and periodic blood transfusions. Not a candidate for IV iron  3. Acute resp failure: ventilator dependent -prolonged course on ventilator, fio2 currently 40% with PEEP of 5.  - Appreciate pulmonary input. ENT recently evaluated tracheostomy  4. Sepsis: pseudomonas in tracheal aspirate - tobramycin inhaled and tigecycline.   5. Secondary Hyperparathyroidism: PTH 50, phos 4.1 - not currently on a binder or vitamin D agent.     LOS: 102 Courtney [name] 1/3/20179:42 AM

## 2015-11-22 NOTE — Progress Notes (Signed)
ANTIBIOTIC CONSULT NOTE - follow Up  Pharmacy Consult for Colistin and Tigecycline  Indication: MDR Respiratory Infection   Allergies  Allergen Reactions  . Contrast Media [Iodinated Diagnostic Agents] Anaphylaxis  . Ampicillin Rash   Patient Measurements: Height:  (SCALE BROKE) Weight: 224 lb 13.9 oz (102 kg) IBW/kg (Calculated) : 66.2  Vital Signs: Temp: 97.4 F (36.3 C) (01/03 1200) Temp Source: Oral (01/03 1200) BP: 107/68 mmHg (01/03 1200) Pulse Rate: 57 (01/03 1200) Intake/Output from previous day: 01/02 0701 - 01/03 0700 In: 1714.6 [I.V.:1394.6; NG/GT:120; IV Piggyback:200] Out: -1000  Intake/Output from this shift: Total I/O In: 421 [I.V.:201; NG/GT:220] Out: -   Recent Labs  11/21/15 0147 11/21/15 0434 11/21/15 1026 11/21/15 1318 11/21/15 2012 11/22/15 0925  WBC  --   --  19.9*  --  24.0* 18.1*  HGB  --   --  9.7*  --  10.1* 9.6*  PLT  --   --  186  --  170 153  CREATININE 1.25* 1.42*  --  1.46*  --   --    Estimated Creatinine Clearance: 58.6 mL/min (by C-G formula based on Cr of 1.46). No results for input(s): VANCOTROUGH, VANCOPEAK, VANCORANDOM, GENTTROUGH, GENTPEAK, GENTRANDOM, TOBRATROUGH, TOBRAPEAK, TOBRARND, AMIKACINPEAK, AMIKACINTROU, AMIKACIN in the last 72 hours.   Microbiology: Recent Results (from the past 720 hour(s))  Culture, blood (Routine X 2) w Reflex to ID Panel     Status: None   Collection Time: 11/06/15  8:27 AM  Result Value Ref Range Status   Specimen Description BLOOD RIGHT HAND  Final   Special Requests BOTTLES DRAWN AEROBIC AND ANAEROBIC 3CC  Final   Culture NO GROWTH 5 DAYS  Final   Report Status 11/11/2015 FINAL  Final  Culture, blood (Routine X 2) w Reflex to ID Panel     Status: None   Collection Time: 11/06/15  8:29 AM  Result Value Ref Range Status   Specimen Description BLOOD LEFT HAND  Final   Special Requests BOTTLES DRAWN AEROBIC AND ANAEROBIC  1CC  Final   Culture NO GROWTH 5 DAYS  Final   Report Status  11/11/2015 FINAL  Final  Culture, expectorated sputum-assessment     Status: None   Collection Time: 11/06/15  8:41 AM  Result Value Ref Range Status   Specimen Description EXPECTORATED SPUTUM  Final   Special Requests Immunocompromised  Final   Sputum evaluation THIS SPECIMEN IS ACCEPTABLE FOR SPUTUM CULTURE  Final   Report Status 11/06/2015 FINAL  Final  Culture, respiratory (NON-Expectorated)     Status: None   Collection Time: 11/06/15  8:41 AM  Result Value Ref Range Status   Specimen Description EXPECTORATED SPUTUM  Final   Special Requests Immunocompromised Reflexed from [id number]  Final   Gram Stain   Final    MANY WBC SEEN MANY GRAM NEGATIVE RODS EXCELLENT SPECIMEN - 90-100% WBCS    Culture   Final    MODERATE GROWTH KLEBSIELLA PNEUMONIAE MODERATE GROWTH PSEUDOMONAS AERUGINOSA CRITICAL RESULT CALLED TO, READ BACK BY AND VERIFIED WITH: DR. FITZGERALD AT 1120 11/09/15 DV DR. FITZGERALD REQUESTED ORGANISM BE SENT OUT FOR TIGECYCLINE CEFT/AVIBACTAM AND COLISTIN Sent to [place] for further susceptibility testing. 11/11/15 CTJ    Report Status 11/15/2015 FINAL  Final   Organism ID, Bacteria KLEBSIELLA PNEUMONIAE  Final   Organism ID, Bacteria PSEUDOMONAS AERUGINOSA  Final      Susceptibility   Klebsiella pneumoniae - MIC*    AMPICILLIN >=32 RESISTANT Resistant     CEFAZOLIN >=64  RESISTANT Resistant     CEFTRIAXONE 32 RESISTANT Resistant     CIPROFLOXACIN >=4 RESISTANT Resistant     GENTAMICIN >=16 RESISTANT Resistant     IMIPENEM >=16 RESISTANT Resistant     NITROFURANTOIN 256 RESISTANT Resistant     TRIMETH/SULFA >=320 RESISTANT Resistant     Extended ESBL POSITIVE Resistant     * MODERATE GROWTH KLEBSIELLA PNEUMONIAE   Pseudomonas aeruginosa - MIC*    CEFTAZIDIME >=64 RESISTANT Resistant     CIPROFLOXACIN 2 INTERMEDIATE Intermediate     GENTAMICIN >=16 RESISTANT Resistant     IMIPENEM >=16 RESISTANT Resistant     * MODERATE GROWTH PSEUDOMONAS AERUGINOSA  Culture,  respiratory (NON-Expectorated)     Status: None   Collection Time: 11/14/15  2:07 PM  Result Value Ref Range Status   Specimen Description TRACHEAL ASPIRATE  Final   Special Requests NONE  Final   Gram Stain   Final    EXCELLENT SPECIMEN - 90-100% WBCS MANY WBC SEEN MODERATE GRAM NEGATIVE RODS    Culture   Final    HEAVY GROWTH PSEUDOMONAS AERUGINOSA CRITICAL VALUE NOTED.  VALUE IS CONSISTENT WITH PREVIOUSLY REPORTED AND CALLED VALUE.    Report Status 11/16/2015 FINAL  Final   Organism ID, Bacteria PSEUDOMONAS AERUGINOSA  Final      Susceptibility   Pseudomonas aeruginosa - MIC*    CEFTAZIDIME >=64 RESISTANT Resistant     CIPROFLOXACIN 2 INTERMEDIATE Intermediate     GENTAMICIN >=16 RESISTANT Resistant     IMIPENEM >=16 RESISTANT Resistant     PIP/TAZO Value in next row Resistant      RESISTANT>=128    TOBRAMYCIN Value in next row Sensitive      SENSITIVE2    * HEAVY GROWTH PSEUDOMONAS AERUGINOSA    Medical History: Past Medical History  Diagnosis Date  . Obesity   . Dyslipidemia   . Hypertension   . Coronary artery disease     s/p BMS 2010 LAD  . Dysrhythmia     ventricular tachycardia resolved after LAD stent and beta blocker  . Diabetes mellitus     with retinopathy, neuropathy and microalbuminemia  . 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 Patient is on day 51 of hospital stay, and has been treated for multiple infections during this time.   11/06/15: Respiratory cultures showing Pan-resistant  Klebsiella pneumoniae and Pseudomonas aeruginosa  Sputum has been [facility] to [facility] for susceptibility testing per Dr. Blane Ohara request.   Patient has increased respiratory secretions, leukocytosis and low grade fever   12/30: Alk phos elevated at 1309  Plan:  Sensitivity results for Avycaz, tigecycline, and colistin still  pending.    Continue tigecycline '50mg'$  IV q12h.   Continue  colistimethate '250mg'$  (~2.5 mg/kg) IV q48 hours.   Patient should be monitored for nephrotoxicity and neurotoxicity.  Pharmacy to continue to follow per consult.      Ulice Dash, PharmD Clinical Pharmacist   11/22/2015 12:58 PM

## 2015-11-22 NOTE — Progress Notes (Signed)
Patients husband called to get an update. Let him know that there were no real changes. Tube feedings remain on hold due to secretions. Currently giving her a bath and changing her dressing to sacral area. He requested that Dr. Sung AmabileSimonds give him a call with an update. Passed that information forward to the physician. Will continue to assess and monitor.

## 2015-11-23 ENCOUNTER — Inpatient Hospital Stay: Payer: 59

## 2015-11-23 LAB — RENAL FUNCTION PANEL
ALBUMIN: 1.5 g/dL — AB (ref 3.5–5.0)
Anion gap: 6 (ref 5–15)
BUN: 72 mg/dL — ABNORMAL HIGH (ref 6–20)
CHLORIDE: 96 mmol/L — AB (ref 101–111)
CO2: 29 mmol/L (ref 22–32)
CREATININE: 1.45 mg/dL — AB (ref 0.44–1.00)
Calcium: 8.1 mg/dL — ABNORMAL LOW (ref 8.9–10.3)
GFR, EST AFRICAN AMERICAN: 48 mL/min — AB (ref >60)
GFR, EST NON AFRICAN AMERICAN: 41 mL/min — AB (ref >60)
Glucose, Bld: 101 mg/dL — ABNORMAL HIGH (ref 65–99)
PHOSPHORUS: 5.2 mg/dL — AB (ref 2.5–4.6)
POTASSIUM: 3.8 mmol/L (ref 3.5–5.1)
Sodium: 131 mmol/L — ABNORMAL LOW (ref 135–145)

## 2015-11-23 LAB — CBC
HEMATOCRIT: 31.7 % — AB (ref 35.0–47.0)
Hemoglobin: 9.8 g/dL — ABNORMAL LOW (ref 12.0–16.0)
MCH: 28.3 pg (ref 26.0–34.0)
MCHC: 31.1 g/dL — ABNORMAL LOW (ref 32.0–36.0)
MCV: 91.1 fL (ref 80.0–100.0)
PLATELETS: 119 10*3/uL — AB (ref 150–440)
RBC: 3.48 MIL/uL — AB (ref 3.80–5.20)
RDW: 19.3 % — ABNORMAL HIGH (ref 11.5–14.5)
WBC: 10.6 10*3/uL (ref 3.6–11.0)

## 2015-11-23 LAB — GLUCOSE, CAPILLARY
GLUCOSE-CAPILLARY: 71 mg/dL (ref 65–99)
GLUCOSE-CAPILLARY: 77 mg/dL (ref 65–99)
GLUCOSE-CAPILLARY: 87 mg/dL (ref 65–99)
Glucose-Capillary: 81 mg/dL (ref 65–99)
Glucose-Capillary: 88 mg/dL (ref 65–99)
Glucose-Capillary: 92 mg/dL (ref 65–99)

## 2015-11-23 MED ORDER — ALBUTEROL SULFATE (2.5 MG/3ML) 0.083% IN NEBU: 2.5000 mg | INHALATION_SOLUTION | RESPIRATORY_TRACT | Status: DC | PRN

## 2015-11-23 MED ORDER — ESCITALOPRAM OXALATE 10 MG PO TABS
10.0000 mg | ORAL_TABLET | Freq: Every day | ORAL | Status: DC
Start: 2015-11-24 — End: 2015-12-06
  Administered 2015-11-24 – 2015-12-06 (×13): 10 mg
  Filled 2015-11-23 (×13): qty 1

## 2015-11-23 MED ORDER — ALBUTEROL SULFATE (2.5 MG/3ML) 0.083% IN NEBU
2.5000 mg | INHALATION_SOLUTION | Freq: Four times a day (QID) | RESPIRATORY_TRACT | Status: DC
  Administered 2015-11-23 – 2015-12-09 (×62): 2.5 mg via RESPIRATORY_TRACT
  Filled 2015-11-23 (×63): qty 3

## 2015-11-23 MED ORDER — ALBUMIN HUMAN 25 % IV SOLN
12.5000 g | Freq: Once | INTRAVENOUS | Status: DC
  Filled 2015-11-23: qty 50

## 2015-11-23 MED ORDER — PRO-STAT SUGAR FREE PO LIQD
30.0000 mL | Freq: Every day | ORAL | Status: DC
  Administered 2015-11-23 – 2015-12-06 (×80): 30 mL

## 2015-11-23 MED ORDER — DEXTROSE 50 % IV SOLN: 50.0000 mL | INTRAVENOUS | Status: DC

## 2015-11-23 NOTE — Progress Notes (Signed)
[name] called and was updated on Pt's current condition. He also wanted to know if the Pt had ever been given meropenem or zobactram. He had me look through Pt's previous orders since admission and tell him if she ever had.

## 2015-11-23 NOTE — Progress Notes (Signed)
Subjective:   Hemodialysis for later today.  Off tube feeds Wbc trending down  Objective:  Vital signs in last 24 hours:  Temp:  [97.3 F (36.3 C)-97.7 F (36.5 C)] 97.7 F (36.5 C) (01/04 0800) Pulse Rate:  [33-63] 42 (01/04 0800) Resp:  [0-23] 23 (01/04 0800) BP: (92-123)/(49-105) 123/85 mmHg (01/04 0800) SpO2:  [94 %-100 %] 99 % (01/04 0800) FiO2 (%):  [40 %-60 %] 40 % (01/04 0800)  Weight change:  Filed Weights   11/21/15 0500 11/21/15 1540 11/22/15 0500  Weight: 107 kg (235 lb 14.3 oz) 107 kg (235 lb 14.3 oz) 102 kg (224 lb 13.9 oz)    Intake/Output: I/O last 3 completed shifts: In: 3239.8 [I.V.:2186.5; NG/GT:550; IV Piggyback:503.3] Out: -    Intake/Output this shift:  Total I/O In: 80 [NG/GT:80] Out: -   Physical Exam: General: critically ill appearing  Head/ENT: OM moist, NG tube  Eyes: Anicteric, eyes closed  Neck: Tracheostomy in place  Lungs:  bilatearl rhonchi, vent assisted PRVC fio2 40%  Heart: S1S2 no rubs  Abdomen:  Soft, nontender, BS present  Extremities: 2+ dependent edema, anasarca  Neurologic: Resting comfortably at the moment  Access:  R IJ permcath.       Basic Metabolic Panel:  Recent Labs Lab 11/18/15 0830 11/18/15 1810 11/19/15 0630 11/21/15 0147 11/21/15 0434 11/21/15 1318  NA 139 137 136 133*  --  131*  K 3.7 3.2* 3.5 3.6  --  3.6  CL 101 101 100* 97*  --  96*  CO2 28 30 27 28   --  28  GLUCOSE 109* 105* 83 133*  --  117*  BUN 58* 41* 45* 67*  --  72*  CREATININE 1.29* 0.98 1.06* 1.25* 1.42* 1.46*  CALCIUM 8.2* 8.1* 8.1* 8.0*  --  8.0*  MG 1.7 1.6* 2.1 2.1  --  2.1  PHOS 4.1  --   --   --   --   --     Liver Function Tests:  Recent Labs Lab 11/18/15 0830  AST 17  ALT 13*  ALKPHOS 1309*  BILITOT 0.7  PROT 5.6*  ALBUMIN 1.6*   No results for input(s): LIPASE, AMYLASE in the last 168 hours. No results for input(s): AMMONIA in the last 168 hours.  CBC:  Recent Labs Lab 11/19/15 0630 11/20/15 0948  11/21/15 1026 11/21/15 2012 11/22/15 0925  WBC 20.5* 20.7* 19.9* 24.0* 18.1*  NEUTROABS 17.2* 18.2* 17.1*  --   --   HGB 7.5* 7.3* 9.7* 10.1* 9.6*  HCT 24.6* 24.8* 30.8* 32.1* 30.7*  MCV 94.5 94.0 91.5 91.2 90.5  PLT 229 229 186 170 153    Cardiac Enzymes:  Recent Labs Lab 11/19/15 0031 11/19/15 0630 11/21/15 2012 11/22/15 0202 11/22/15 0925  TROPONINI 0.05* 0.13* 0.05* 0.16* 0.06*    BNP: Invalid input(s): POCBNP  CBG:  Recent Labs Lab 11/22/15 1132 11/22/15 1654 11/22/15 2018 11/23/15 0035 11/23/15 0733  GLUCAP 103* 93 96 92 81    Microbiology: Results for orders placed or performed during the hospital encounter of Sep 10, 2015  Blood Culture (routine x 2)     Status: None   Collection Time: 10-Sep-2015  8:51 AM  Result Value Ref Range Status   Specimen Description BLOOD [name]  Final   Special Requests BOTTLES DRAWN AEROBIC AND ANAEROBIC  3CC  Final   Culture NO GROWTH 5 DAYS  Final   Report Status 08/17/2015 FINAL  Final  Blood Culture (routine x 2)  Status: None   Collection Time: 08/13/2015  9:20 AM  Result Value Ref Range Status   Specimen Description BLOOD LEFT ARM  Final   Special Requests BOTTLES DRAWN AEROBIC AND ANAEROBIC  1CC  Final   Culture NO GROWTH 5 DAYS  Final   Report Status 08/17/2015 FINAL  Final  Wound culture     Status: None   Collection Time: 07/25/2015  9:20 AM  Result Value Ref Range Status   Specimen Description DECUBITIS  Final   Special Requests Normal  Final   Gram Stain   Final    FEW WBC SEEN MANY GRAM NEGATIVE RODS RARE GRAM POSITIVE COCCI    Culture   Final    HEAVY GROWTH ESCHERICHIA COLI MODERATE GROWTH ENTEROBACTER AEROGENES PROTEUS MIRABILIS HEAVY GROWTH ENTEROCOCCUS SPECIES VRE HAVE INTRINSIC RESISTANCE TO MOST COMMONLY USED ANTIBIOTICS AND THE ABILITY TO ACQUIRE RESISTANCE TO MOST AVAILABLE ANTIBIOTICS.    Report Status 08/16/2015 FINAL  Final   Organism ID, Bacteria ESCHERICHIA COLI  Final   Organism ID,  Bacteria ENTEROBACTER AEROGENES  Final   Organism ID, Bacteria PROTEUS MIRABILIS  Final   Organism ID, Bacteria ENTEROCOCCUS SPECIES  Final      Susceptibility   Enterobacter aerogenes - MIC*    CEFTAZIDIME <=1 SENSITIVE Sensitive     CEFAZOLIN >=64 RESISTANT Resistant     CEFTRIAXONE <=1 SENSITIVE Sensitive     CIPROFLOXACIN <=0.25 SENSITIVE Sensitive     GENTAMICIN <=1 SENSITIVE Sensitive     IMIPENEM 1 SENSITIVE Sensitive     TRIMETH/SULFA <=20 SENSITIVE Sensitive     * MODERATE GROWTH ENTEROBACTER AEROGENES   Escherichia coli - MIC*    AMPICILLIN <=2 SENSITIVE Sensitive     CEFTAZIDIME <=1 SENSITIVE Sensitive     CEFAZOLIN <=4 SENSITIVE Sensitive     CEFTRIAXONE <=1 SENSITIVE Sensitive     CIPROFLOXACIN <=0.25 SENSITIVE Sensitive     GENTAMICIN <=1 SENSITIVE Sensitive     IMIPENEM <=0.25 SENSITIVE Sensitive     TRIMETH/SULFA <=20 SENSITIVE Sensitive     * HEAVY GROWTH ESCHERICHIA COLI   Proteus mirabilis - MIC*    AMPICILLIN >=32 RESISTANT Resistant     CEFTAZIDIME <=1 SENSITIVE Sensitive     CEFAZOLIN 8 SENSITIVE Sensitive     CEFTRIAXONE <=1 SENSITIVE Sensitive     CIPROFLOXACIN <=0.25 SENSITIVE Sensitive     GENTAMICIN <=1 SENSITIVE Sensitive     IMIPENEM 1 SENSITIVE Sensitive     TRIMETH/SULFA <=20 SENSITIVE Sensitive     * PROTEUS MIRABILIS   Enterococcus species - MIC*    AMPICILLIN >=32 RESISTANT Resistant     VANCOMYCIN >=32 RESISTANT Resistant     GENTAMICIN SYNERGY SENSITIVE Sensitive     TETRACYCLINE Value in next row Resistant      RESISTANT>=16    * HEAVY GROWTH ENTEROCOCCUS SPECIES  MRSA PCR Screening     Status: None   Collection Time: 08/16/2015  2:38 PM  Result Value Ref Range Status   MRSA by PCR NEGATIVE NEGATIVE Final    Comment:        The GeneXpert MRSA Assay (FDA approved for NASAL specimens only), is one component of a comprehensive MRSA colonization surveillance program. It is not intended to diagnose MRSA infection nor to guide  or monitor treatment for MRSA infections.   Blood culture (single)     Status: None   Collection Time: 08/08/2015  3:36 PM  Result Value Ref Range Status   Specimen Description BLOOD RIGHT ASSIST CONTROL  Final   Special Requests BOTTLES DRAWN AEROBIC AND ANAEROBIC  Final   Culture NO GROWTH 5 DAYS  Final   Report Status 08/17/2015 FINAL  Final  Wound culture     Status: None   Collection Time: 08/13/15 12:37 PM  Result Value Ref Range Status   Specimen Description WOUND  Final   Special Requests Normal  Final   Gram Stain   Final    FEW WBC SEEN TOO NUMEROUS TO COUNT GRAM NEGATIVE RODS FEW GRAM POSITIVE COCCI    Culture   Final    HEAVY GROWTH ESCHERICHIA COLI MODERATE GROWTH PROTEUS MIRABILIS LIGHT GROWTH KLEBSIELLA PNEUMONIAE MODERATE GROWTH ENTEROCOCCUS GALLINARUM CRITICAL RESULT CALLED TO, READ BACK BY AND VERIFIED WITH: [facility] BORBA AT 1042 08/16/15 DV    Report Status 08/17/2015 FINAL  Final   Organism ID, Bacteria ESCHERICHIA COLI  Final   Organism ID, Bacteria PROTEUS MIRABILIS  Final   Organism ID, Bacteria KLEBSIELLA PNEUMONIAE  Final   Organism ID, Bacteria ENTEROCOCCUS GALLINARUM  Final      Susceptibility   Escherichia coli - MIC*    AMPICILLIN >=32 RESISTANT Resistant     CEFTAZIDIME 4 RESISTANT Resistant     CEFAZOLIN >=64 RESISTANT Resistant     CEFTRIAXONE 16 RESISTANT Resistant     GENTAMICIN 2 SENSITIVE Sensitive     IMIPENEM >=16 RESISTANT Resistant     TRIMETH/SULFA <=20 SENSITIVE Sensitive     Extended ESBL POSITIVE Resistant     PIP/TAZO Value in next row Resistant      RESISTANT>=128    CIPROFLOXACIN Value in next row Sensitive      SENSITIVE<=0.25    * HEAVY GROWTH ESCHERICHIA COLI   Klebsiella pneumoniae - MIC*    AMPICILLIN Value in next row Resistant      SENSITIVE<=0.25    CEFTAZIDIME Value in next row Resistant      SENSITIVE<=0.25    CEFAZOLIN Value in next row Resistant      SENSITIVE<=0.25    CEFTRIAXONE Value in next row  Resistant      SENSITIVE<=0.25    CIPROFLOXACIN Value in next row Resistant      SENSITIVE<=0.25    GENTAMICIN Value in next row Sensitive      SENSITIVE<=0.25    IMIPENEM Value in next row Resistant      SENSITIVE<=0.25    TRIMETH/SULFA Value in next row Resistant      SENSITIVE<=0.25    PIP/TAZO Value in next row Resistant      RESISTANT>=128    * LIGHT GROWTH KLEBSIELLA PNEUMONIAE   Proteus mirabilis - MIC*    AMPICILLIN Value in next row Resistant      RESISTANT>=128    CEFTAZIDIME Value in next row Sensitive      RESISTANT>=128    CEFAZOLIN Value in next row Sensitive      RESISTANT>=128    CEFTRIAXONE Value in next row Sensitive      RESISTANT>=128    CIPROFLOXACIN Value in next row Sensitive      RESISTANT>=128    GENTAMICIN Value in next row Sensitive      RESISTANT>=128    IMIPENEM Value in next row Sensitive      RESISTANT>=128    TRIMETH/SULFA Value in next row Sensitive      RESISTANT>=128    PIP/TAZO Value in next row Sensitive      SENSITIVE<=4    * MODERATE GROWTH PROTEUS MIRABILIS   Enterococcus gallinarum - MIC*    AMPICILLIN  Value in next row Resistant      SENSITIVE<=4    GENTAMICIN SYNERGY Value in next row Sensitive      SENSITIVE<=4    CIPROFLOXACIN Value in next row Resistant      RESISTANT>=8    TETRACYCLINE Value in next row Resistant      RESISTANT>=16    * MODERATE GROWTH ENTEROCOCCUS GALLINARUM  Wound culture     Status: None   Collection Time: 08/15/15  2:53 PM  Result Value Ref Range Status   Specimen Description WOUND  Final   Special Requests NONE  Final   Gram Stain FEW WBC SEEN NO ORGANISMS SEEN   Final   Culture NO GROWTH 3 DAYS  Final   Report Status 08/18/2015 FINAL  Final  C difficile quick scan w PCR reflex     Status: None   Collection Time: 08/17/15 11:34 AM  Result Value Ref Range Status   C Diff antigen NEGATIVE NEGATIVE Final   C Diff toxin NEGATIVE NEGATIVE Final   C Diff interpretation Negative for C. difficile   Final  Stool culture     Status: None   Collection Time: 09/03/15  3:51 PM  Result Value Ref Range Status   Specimen Description STOOL  Final   Special Requests Immunocompromised  Final   Culture   Final    NO SALMONELLA OR SHIGELLA ISOLATED No Pathogenic E. coli detected NO CAMPYLOBACTER DETECTED    Report Status 09/07/2015 FINAL  Final  C difficile quick scan w PCR reflex     Status: None   Collection Time: 09/13/15 12:51 AM  Result Value Ref Range Status   C Diff antigen NEGATIVE NEGATIVE Final   C Diff toxin NEGATIVE NEGATIVE Final   C Diff interpretation Negative for C. difficile  Final  Culture, blood (routine x 2)     Status: None   Collection Time: 09/16/15 11:24 AM  Result Value Ref Range Status   Specimen Description BLOOD LEFT HAND  Final   Special Requests BOTTLES DRAWN AEROBIC AND ANAEROBIC  1CC  Final   Culture NO GROWTH 5 DAYS  Final   Report Status 09/21/2015 FINAL  Final  Culture, blood (routine x 2)     Status: None   Collection Time: 09/16/15 12:23 PM  Result Value Ref Range Status   Specimen Description BLOOD RIGHT HAND  Final   Special Requests BOTTLES DRAWN AEROBIC AND ANAEROBIC  1CC  Final   Culture  Setup Time   Final    GRAM POSITIVE COCCI AEROBIC BOTTLE ONLY CRITICAL RESULT CALLED TO, READ BACK BY AND VERIFIED WITH: TESS THOMAS,RN 09/17/2015 0631 BY JRS.    Culture   Final    ENTEROCOCCUS FAECALIS AEROBIC BOTTLE ONLY VRE HAVE INTRINSIC RESISTANCE TO MOST COMMONLY USED ANTIBIOTICS AND THE ABILITY TO ACQUIRE RESISTANCE TO MOST AVAILABLE ANTIBIOTICS. CRITICAL RESULT CALLED TO, READ BACK BY AND VERIFIED WITH: CHERYL SMITH AT 9604 09/19/15 DV    Report Status 09/21/2015 FINAL  Final   Organism ID, Bacteria ENTEROCOCCUS FAECALIS  Final      Susceptibility   Enterococcus faecalis - MIC*    AMPICILLIN <=2 SENSITIVE Sensitive     LINEZOLID 2 SENSITIVE Sensitive     CIPROFLOXACIN Value in next row Resistant      RESISTANT>=8    TETRACYCLINE Value in  next row Resistant      RESISTANT>=16    VANCOMYCIN Value in next row Resistant      RESISTANT>=32    GENTAMICIN  SYNERGY Value in next row Resistant      RESISTANT>=32    * ENTEROCOCCUS FAECALIS  Culture, respiratory (NON-Expectorated)     Status: None   Collection Time: 09/16/15  3:50 PM  Result Value Ref Range Status   Specimen Description TRACHEAL ASPIRATE  Final   Special Requests Immunocompromised  Final   Gram Stain   Final    FEW WBC SEEN GOOD SPECIMEN - 80-90% WBCS RARE GRAM NEGATIVE RODS    Culture LIGHT GROWTH PSEUDOMONAS AERUGINOSA  Final   Report Status 09/19/2015 FINAL  Final   Organism ID, Bacteria PSEUDOMONAS AERUGINOSA  Final      Susceptibility   Pseudomonas aeruginosa - MIC*    CEFTAZIDIME 8 SENSITIVE Sensitive     CIPROFLOXACIN 2 INTERMEDIATE Intermediate     GENTAMICIN >=16 RESISTANT Resistant     IMIPENEM >=16 RESISTANT Resistant     PIP/TAZO Value in next row Sensitive      SENSITIVE32    CEFEPIME Value in next row Sensitive      SENSITIVE8    LEVOFLOXACIN Value in next row Resistant      RESISTANT>=8    * LIGHT GROWTH PSEUDOMONAS AERUGINOSA  Culture, expectorated sputum-assessment     Status: None   Collection Time: 09/22/15  2:09 PM  Result Value Ref Range Status   Specimen Description ENDOTRACHEAL  Final   Special Requests Normal  Final   Sputum evaluation THIS SPECIMEN IS ACCEPTABLE FOR SPUTUM CULTURE  Final   Report Status 09/24/2015 FINAL  Final  Culture, respiratory (NON-Expectorated)     Status: None   Collection Time: 09/22/15  2:09 PM  Result Value Ref Range Status   Specimen Description ENDOTRACHEAL  Final   Special Requests Normal Reflexed from [id number]  Final   Gram Stain   Final    FEW WBC SEEN FEW GRAM NEGATIVE RODS POOR SPECIMEN - LESS THAN 70% WBCS    Culture   Final    MODERATE GROWTH PSEUDOMONAS AERUGINOSA LIGHT GROWTH KLEBSIELLA PNEUMONIAE REFER TO SENSITIVITIES FROM PREVIOUS CULTURE FOR ORG 2    Report Status 10/01/2015  FINAL  Final   Organism ID, Bacteria PSEUDOMONAS AERUGINOSA  Final   Organism ID, Bacteria KLEBSIELLA PNEUMONIAE  Final      Susceptibility   Pseudomonas aeruginosa - MIC*    CEFTAZIDIME 4 SENSITIVE Sensitive     CIPROFLOXACIN 2 INTERMEDIATE Intermediate     GENTAMICIN >=16 RESISTANT Resistant     IMIPENEM >=16 RESISTANT Resistant     PIP/TAZO Value in next row Sensitive      SENSITIVE16    * MODERATE GROWTH PSEUDOMONAS AERUGINOSA  Tissue culture     Status: None   Collection Time: 09/24/15  6:44 AM  Result Value Ref Range Status   Specimen Description BONE  Final   Special Requests Normal  Final   Gram Stain MODERATE WBC SEEN FEW GRAM NEGATIVE RODS   Final   Culture   Final    MODERATE GROWTH ESCHERICHIA COLI MODERATE GROWTH KLEBSIELLA PNEUMONIAE ESBL-EXTENDED SPECTRUM BETA LACTAMASE-THE ORGANISM IS RESISTANT TO PENICILLINS, CEPHALOSPORINS AND AZTREONAM ACCORDING TO CLSI M100-S15 VOL.25 N01 JAN 2005. ORGANISM 1 This organism isolate is resistant to one or more antiotic agents in three or more antimicrobial categories.  Suggest Infectious Disease consult.   ORGANISM 2 CRITICAL RESULT CALLED TO, READ BACK BY AND VERIFIED WITH: RN Mardene Celeste LINDSAY 09/27/15 1005AM    Report Status 09/28/2015 FINAL  Final   Organism ID, Bacteria ESCHERICHIA COLI  Final  Organism ID, Bacteria KLEBSIELLA PNEUMONIAE  Final      Susceptibility   Escherichia coli - MIC*    AMPICILLIN >=32 RESISTANT Resistant     CEFTAZIDIME 4 RESISTANT Resistant     CEFAZOLIN >=64 RESISTANT Resistant     CEFTRIAXONE 8 RESISTANT Resistant     GENTAMICIN <=1 SENSITIVE Sensitive     IMIPENEM 8 RESISTANT Resistant     TRIMETH/SULFA <=20 SENSITIVE Sensitive     Extended ESBL POSITIVE Resistant     PIP/TAZO Value in next row Resistant      RESISTANT>=128    * MODERATE GROWTH ESCHERICHIA COLI   Klebsiella pneumoniae - MIC*    AMPICILLIN Value in next row Resistant      RESISTANT>=128    CEFTAZIDIME Value in next row  Resistant      RESISTANT>=128    CEFAZOLIN Value in next row Resistant      RESISTANT>=128    CEFTRIAXONE Value in next row Resistant      RESISTANT>=128    CIPROFLOXACIN Value in next row Resistant      RESISTANT>=128    GENTAMICIN Value in next row Resistant      RESISTANT>=128    IMIPENEM Value in next row Resistant      RESISTANT>=128    TRIMETH/SULFA Value in next row Sensitive      RESISTANT>=128    PIP/TAZO Value in next row Resistant      RESISTANT>=128    * MODERATE GROWTH KLEBSIELLA PNEUMONIAE  Anaerobic culture     Status: None   Collection Time: 09/24/15  3:55 PM  Result Value Ref Range Status   Specimen Description BONE  Final   Special Requests Normal  Final   Culture NO ANAEROBES ISOLATED  Final   Report Status 09/29/2015 FINAL  Final  Culture, fungus without smear     Status: None   Collection Time: 09/24/15  3:55 PM  Result Value Ref Range Status   Specimen Description BONE  Final   Special Requests Normal  Final   Culture NO FUNGUS ISOLATED AFTER 24 DAYS  Final   Report Status 10/18/2015 FINAL  Final  C difficile quick scan w PCR reflex     Status: None   Collection Time: 10/07/15  2:02 PM  Result Value Ref Range Status   C Diff antigen NEGATIVE NEGATIVE Final   C Diff toxin NEGATIVE NEGATIVE Final   C Diff interpretation Negative for C. difficile  Final  Culture, blood (routine x 2)     Status: None   Collection Time: 10/16/15  9:52 AM  Result Value Ref Range Status   Specimen Description BLOOD LEFT HAND  Final   Special Requests   Final    BOTTLES DRAWN AEROBIC AND ANAEROBIC  AER 6CC ANA 4CC   Culture NO GROWTH 6 DAYS  Final   Report Status 10/22/2015 FINAL  Final  Culture, blood (routine x 2)     Status: None   Collection Time: 10/16/15 12:25 PM  Result Value Ref Range Status   Specimen Description BLOOD LEFT HAND  Final   Special Requests BOTTLES DRAWN AEROBIC AND ANAEROBIC  5CC  Final   Culture NO GROWTH 6 DAYS  Final   Report Status  10/22/2015 FINAL  Final  Culture, expectorated sputum-assessment     Status: None   Collection Time: 10/18/15  1:15 PM  Result Value Ref Range Status   Specimen Description SPUTUM  Final   Special Requests NONE  Final   Sputum  evaluation THIS SPECIMEN IS ACCEPTABLE FOR SPUTUM CULTURE  Final   Report Status 10/18/2015 FINAL  Final  Culture, respiratory (NON-Expectorated)     Status: None   Collection Time: 10/18/15  1:15 PM  Result Value Ref Range Status   Specimen Description SPUTUM  Final   Special Requests NONE Reflexed from [id number]  Final   Gram Stain   Final    GOOD SPECIMEN - 80-90% WBCS MODERATE WBC SEEN MANY GRAM NEGATIVE RODS    Culture   Final    HEAVY GROWTH PSEUDOMONAS AERUGINOSA MODERATE GROWTH KLEBSIELLA PNEUMONIAE CRITICAL RESULT CALLED TO, READ BACK BY AND VERIFIED WITHRod Mae AT 1610 10/21/15 DV KLEBSIELLA PNEUMONIAE This organism isolate is resistant to one or more antiotic agents in three or more antimicrobial categories.  Suggest Infectious Disease  consult.      Report Status 10/22/2015 FINAL  Final   Organism ID, Bacteria PSEUDOMONAS AERUGINOSA  Final   Organism ID, Bacteria KLEBSIELLA PNEUMONIAE  Final      Susceptibility   Klebsiella pneumoniae - MIC*    AMPICILLIN >=32 RESISTANT Resistant     CEFAZOLIN >=64 RESISTANT Resistant     CEFTRIAXONE >=64 RESISTANT Resistant     CIPROFLOXACIN >=4 RESISTANT Resistant     GENTAMICIN >=16 RESISTANT Resistant     IMIPENEM >=16 RESISTANT Resistant     NITROFURANTOIN >=512 RESISTANT Resistant     TRIMETH/SULFA <=20 SENSITIVE Sensitive     * MODERATE GROWTH KLEBSIELLA PNEUMONIAE   Pseudomonas aeruginosa - MIC*    CEFTAZIDIME 4 SENSITIVE Sensitive     CIPROFLOXACIN >=4 RESISTANT Resistant     GENTAMICIN 8 INTERMEDIATE Intermediate     IMIPENEM >=16 RESISTANT Resistant     PIP/TAZO Value in next row Sensitive      SENSITIVE32    * HEAVY GROWTH PSEUDOMONAS AERUGINOSA  C difficile quick scan w PCR reflex      Status: None   Collection Time: 10/18/15  4:39 PM  Result Value Ref Range Status   C Diff antigen NEGATIVE NEGATIVE Final   C Diff toxin NEGATIVE NEGATIVE Final   C Diff interpretation Negative for C. difficile  Final  Culture, blood (Routine X 2) w Reflex to ID Panel     Status: None   Collection Time: 11/06/15  8:27 AM  Result Value Ref Range Status   Specimen Description BLOOD RIGHT HAND  Final   Special Requests BOTTLES DRAWN AEROBIC AND ANAEROBIC 3CC  Final   Culture NO GROWTH 5 DAYS  Final   Report Status 11/11/2015 FINAL  Final  Culture, blood (Routine X 2) w Reflex to ID Panel     Status: None   Collection Time: 11/06/15  8:29 AM  Result Value Ref Range Status   Specimen Description BLOOD LEFT HAND  Final   Special Requests BOTTLES DRAWN AEROBIC AND ANAEROBIC  1CC  Final   Culture NO GROWTH 5 DAYS  Final   Report Status 11/11/2015 FINAL  Final  Culture, expectorated sputum-assessment     Status: None   Collection Time: 11/06/15  8:41 AM  Result Value Ref Range Status   Specimen Description EXPECTORATED SPUTUM  Final   Special Requests Immunocompromised  Final   Sputum evaluation THIS SPECIMEN IS ACCEPTABLE FOR SPUTUM CULTURE  Final   Report Status 11/06/2015 FINAL  Final  Culture, respiratory (NON-Expectorated)     Status: None   Collection Time: 11/06/15  8:41 AM  Result Value Ref Range Status   Specimen Description EXPECTORATED SPUTUM  Final   Special Requests Immunocompromised Reflexed from [id number]  Final   Gram Stain   Final    MANY WBC SEEN MANY GRAM NEGATIVE RODS EXCELLENT SPECIMEN - 90-100% WBCS    Culture   Final    MODERATE GROWTH KLEBSIELLA PNEUMONIAE MODERATE GROWTH PSEUDOMONAS AERUGINOSA CRITICAL RESULT CALLED TO, READ BACK BY AND VERIFIED WITH: DR. FITZGERALD AT 1120 11/09/15 DV DR. FITZGERALD REQUESTED ORGANISM BE SENT OUT FOR TIGECYCLINE CEFT/AVIBACTAM AND COLISTIN Sent to [facility] for further susceptibility testing. 11/11/15 CTJ    Report Status  11/15/2015 FINAL  Final   Organism ID, Bacteria KLEBSIELLA PNEUMONIAE  Final   Organism ID, Bacteria PSEUDOMONAS AERUGINOSA  Final      Susceptibility   Klebsiella pneumoniae - MIC*    AMPICILLIN >=32 RESISTANT Resistant     CEFAZOLIN >=64 RESISTANT Resistant     CEFTRIAXONE 32 RESISTANT Resistant     CIPROFLOXACIN >=4 RESISTANT Resistant     GENTAMICIN >=16 RESISTANT Resistant     IMIPENEM >=16 RESISTANT Resistant     NITROFURANTOIN 256 RESISTANT Resistant     TRIMETH/SULFA >=320 RESISTANT Resistant     Extended ESBL POSITIVE Resistant     * MODERATE GROWTH KLEBSIELLA PNEUMONIAE   Pseudomonas aeruginosa - MIC*    CEFTAZIDIME >=64 RESISTANT Resistant     CIPROFLOXACIN 2 INTERMEDIATE Intermediate     GENTAMICIN >=16 RESISTANT Resistant     IMIPENEM >=16 RESISTANT Resistant     * MODERATE GROWTH PSEUDOMONAS AERUGINOSA  Culture, respiratory (NON-Expectorated)     Status: None   Collection Time: 11/14/15  2:07 PM  Result Value Ref Range Status   Specimen Description TRACHEAL ASPIRATE  Final   Special Requests NONE  Final   Gram Stain   Final    EXCELLENT SPECIMEN - 90-100% WBCS MANY WBC SEEN MODERATE GRAM NEGATIVE RODS    Culture   Final    HEAVY GROWTH PSEUDOMONAS AERUGINOSA CRITICAL VALUE NOTED.  VALUE IS CONSISTENT WITH PREVIOUSLY REPORTED AND CALLED VALUE.    Report Status 11/16/2015 FINAL  Final   Organism ID, Bacteria PSEUDOMONAS AERUGINOSA  Final      Susceptibility   Pseudomonas aeruginosa - MIC*    CEFTAZIDIME >=64 RESISTANT Resistant     CIPROFLOXACIN 2 INTERMEDIATE Intermediate     GENTAMICIN >=16 RESISTANT Resistant     IMIPENEM >=16 RESISTANT Resistant     PIP/TAZO Value in next row Resistant      RESISTANT>=128    TOBRAMYCIN Value in next row Sensitive      SENSITIVE2    * HEAVY GROWTH PSEUDOMONAS AERUGINOSA    Coagulation Studies:  Recent Labs  11/21/15 2012  LABPROT 18.8*  INR 1.57    Urinalysis: No results for input(s): COLORURINE, LABSPEC,  PHURINE, GLUCOSEU, HGBUR, BILIRUBINUR, KETONESUR, PROTEINUR, UROBILINOGEN, NITRITE, LEUKOCYTESUR in the last 72 hours.  Invalid input(s): APPERANCEUR    Imaging: [place] Venous Img Lower Bilateral  11/22/2015  CLINICAL DATA:  Hypoxia. EXAM: BILATERAL LOWER EXTREMITY VENOUS DOPPLER ULTRASOUND TECHNIQUE: Gray-scale sonography with graded compression, as well as color Doppler and duplex ultrasound were performed to evaluate the lower extremity deep venous systems from the level of the common femoral vein and including the common femoral, femoral, profunda femoral, popliteal and calf veins including the posterior tibial, peroneal and gastrocnemius veins when visible. The superficial great saphenous vein was also interrogated. Spectral Doppler was utilized to evaluate flow at rest and with distal augmentation maneuvers in the common femoral, femoral and popliteal veins. COMPARISON:  Bilateral lower extremity  duplex 11/03/2015, 08/28/2015 FINDINGS: RIGHT LOWER EXTREMITY Common Femoral Vein: No evidence of thrombus. Normal compressibility, respiratory phasicity and response to augmentation. Saphenofemoral Junction: No evidence of thrombus. Normal compressibility and flow on color Doppler imaging. Profunda Femoral Vein: No evidence of thrombus. Normal compressibility and flow on color Doppler imaging. Femoral Vein: No evidence of thrombus. Normal compressibility, respiratory phasicity and response to augmentation. Popliteal Vein: No evidence of thrombus. Normal compressibility, respiratory phasicity and response to augmentation. Calf Veins: Poorly visualized due to edema. No evidence of thrombus. Normal compressibility and flow on color Doppler imaging. Superficial Great Saphenous Vein: No evidence of thrombus. Normal compressibility and flow on color Doppler imaging. Venous Reflux:  None. Other Findings:   Subcutaneous edema noted. LEFT LOWER EXTREMITY Common Femoral Vein: No evidence of thrombus. Normal compressibility,  respiratory phasicity and response to augmentation. Saphenofemoral Junction: No evidence of thrombus. Normal compressibility and flow on color Doppler imaging. Profunda Femoral Vein: No evidence of thrombus. Normal compressibility and flow on color Doppler imaging. Femoral Vein: No evidence of thrombus. Normal compressibility, respiratory phasicity and response to augmentation. Popliteal Vein: No evidence of thrombus. Normal compressibility, respiratory phasicity and response to augmentation. Calf Veins: Limited due to subcutaneous edema. No evidence of thrombus. Normal compressibility and flow on color Doppler imaging. Superficial Great Saphenous Vein: No evidence of thrombus. Normal compressibility and flow on color Doppler imaging. Venous Reflux:  None. Other Findings:  Subcutaneous edema noted. IMPRESSION: No evidence of bilateral lower extremity deep venous thrombosis. Soft tissue edema is seen. Electronically Signed   By: Rubye Oaks M.D.   On: 11/22/2015 00:34   Dg Chest Port 1 View  11/21/2015  CLINICAL DATA:  Acute on chronic respiratory failure. EXAM: PORTABLE CHEST 1 VIEW COMPARISON:  November 15, 2015. FINDINGS: Stable cardiomegaly. No pneumothorax is noted. Tracheostomy and feeding tube are unchanged in position. Right internal jugular dialysis catheter is unchanged with distal tip in expected position of cavoatrial junction. Stable bilateral lung opacities are noted concerning for pneumonia or edema. Minimal bilateral pleural effusions are noted. Bony thorax is unremarkable. IMPRESSION: Stable support apparatus. Stable bilateral lung opacities are noted concerning for edema or pneumonia. Minimal bilateral pleural effusions are noted as well. Electronically Signed   By: Lupita Raider, M.D.   On: 11/21/2015 17:48     Medications:   . dextrose 25 mL/hr at 11/22/15 1205  . feeding supplement (JEVITY 1.5 CAL/FIBER) Stopped (11/22/15 0200)   . albumin human  12.5 g Intravenous Once  .  albumin human  12.5 g Intravenous Once  . amiodarone  100 mg Per Tube Daily  . antiseptic oral rinse  7 mL Mouth Rinse QID  . aspirin  81 mg Per Tube Daily  . chlorhexidine gluconate  15 mL Mouth Rinse BID  . colistimethate (COLISTIN) IV  250 mg Intravenous Q48H  . epoetin (EPOGEN/PROCRIT) injection  10,000 Units Intravenous Q M,W,F-HD  . escitalopram  10 mg Oral Daily  . feeding supplement (PRO-STAT SUGAR FREE 64)  30 mL Oral 6 X Daily  . free water  20 mL Per Tube 6 times per day  . heparin subcutaneous  5,000 Units Subcutaneous Q12H  . hydrocortisone  10 mg Per Tube BID  . lidocaine  1 patch Transdermal Q24H  . metoprolol tartrate  12.5 mg Per Tube BID  . midodrine  10 mg Per Tube TID WC  . mirtazapine  7.5 mg Per Tube QHS  . multivitamin  5 mL Per Tube Daily  . pantoprazole  sodium  40 mg Per Tube Daily  . sodium chloride  10-40 mL Intracatheter Q12H  . sodium hypochlorite   Irrigation BID  . tigecycline (TYGACIL) IVPB  50 mg Intravenous Q12H  . tobramycin (PF)  300 mg Nebulization BID   acetaminophen (TYLENOL) oral liquid 160 mg/5 mL, HYDROmorphone (DILAUDID) injection, ipratropium-albuterol, loperamide, ondansetron (ZOFRAN) IV, oxyCODONE, polyvinyl alcohol, sodium chloride, zinc oxide  Assessment/ Plan:  50 y.o. 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 Admitted on 08/17/2015  Echo: 11/07/19: EF of 30% to 35%.    1. End-stage renal disease on hemodialysis on HD MWF. The patient has been on dialysis since October of 2014. R IJ permcath.  - Dialysis treatment with IV albumin for oncotic support.  - Midodrine and hydrocortisone ordered for blood pressure support.  - Continue MWF schedule, monitor daily for dailysis treatment. Orders prepared for today.    2. Anemia of CKD. Hemoglobin 9.6. Status post PRBC transfusion on 1/1. Multiple transfusions during admission - Epogen and periodic blood transfusions. Not a candidate for IV iron. Epo ordered for today on HD treatment.   3. Acute resp failure: ventilator dependent -prolonged course on ventilator, fio2 currently 40% with PEEP of 5.  - Appreciate pulmonary input. ENT recently evaluated tracheostomy  4. Sepsis: pseudomonas in tracheal aspirate - tobramycin inhaled and tigecycline.   5. Secondary Hyperparathyroidism: PTH 50, phos 4.1 - not currently on a binder or vitamin D agent.     LOS: 103 Kharlie Bring 1/4/20179:26 AM

## 2015-11-23 NOTE — Progress Notes (Signed)
HD tx ended. Pt tolerated tx. Vitals remianed stable throughout tx. Pt does not appear to be in any distress. Dsg c/d/i. End capped and locked. UF goal achieved . Report given to ICU nurse.  Pt stable

## 2015-11-23 NOTE — Progress Notes (Signed)
MEDICATION RELATED CONSULT NOTE - Follow up   Pharmacy Consult for Renal dosing  Allergies  Allergen Reactions  . Contrast Media [Iodinated Diagnostic Agents] Anaphylaxis  . Ampicillin Rash   Patient Measurements: Ht 225ft 9in Height:  (SCALE BROKE) Weight: 224 lb 13.9 oz (102 kg) IBW/kg (Calculated) : 66.2  Vital Signs: Temp: 97.7 F (36.5 C) (01/04 0800) Temp Source: Oral (01/04 0800) BP: 118/61 mmHg (01/04 1000) Pulse Rate: 57 (01/04 1000)  Recent Labs  11/21/15 0147 11/21/15 0434 11/21/15 1026 11/21/15 1318 11/21/15 2012 11/22/15 0925  WBC  --   --  19.9*  --  24.0* 18.1*  HGB  --   --  9.7*  --  10.1* 9.6*  HCT  --   --  30.8*  --  32.1* 30.7*  PLT  --   --  186  --  170 153  APTT  --   --   --   --  41*  --   CREATININE 1.25* 1.42*  --  1.46*  --   --   MG 2.1  --   --  2.1  --   --    Estimated Creatinine Clearance: 58.6 mL/min (by C-G formula based on Cr of 1.46).  Assessment: 50 yo patient on HD. Pharmacy consulted for renal dosing of medications.   Plan:  No medications require adjustment at present. Pharmacy will continue to monitor and dose adjust as needed.     Luisa HartScott Nikira Kushnir, PharmD   11/23/2015

## 2015-11-23 NOTE — Progress Notes (Signed)
HD tx started as prescribed. Vitals stable. Pt does not appear to be in any distress.  Sleeping at this time.  Generalized edema noted.  Access / lines visible.  Will continue to monitor throughout tx.

## 2015-11-23 NOTE — Progress Notes (Signed)
ANTIBIOTIC CONSULT NOTE - follow Up  Pharmacy Consult for Colistin and Tigecycline  Indication: MDR Respiratory Infection   Allergies  Allergen Reactions  . Contrast Media [Iodinated Diagnostic Agents] Anaphylaxis  . Ampicillin Rash   Patient Measurements: Height:  (SCALE BROKE) Weight: 224 lb 13.9 oz (102 kg) IBW/kg (Calculated) : 66.2  Vital Signs: Temp: 97.7 F (36.5 C) (01/04 0800) Temp Source: Oral (01/04 0800) BP: 118/61 mmHg (01/04 1000) Pulse Rate: 57 (01/04 1000) Intake/Output from previous day: 01/03 0701 - 01/04 0700 In: 1555.2 [I.V.:791.9; NG/GT:460; IV Piggyback:303.3] Out: -  Intake/Output from this shift: Total I/O In: 80 [NG/GT:80] Out: -   Recent Labs  11/21/15 0147 11/21/15 0434 11/21/15 1026 11/21/15 1318 11/21/15 2012 11/22/15 0925  WBC  --   --  19.9*  --  24.0* 18.1*  HGB  --   --  9.7*  --  10.1* 9.6*  PLT  --   --  186  --  170 153  CREATININE 1.25* 1.42*  --  1.46*  --   --    Estimated Creatinine Clearance: 58.6 mL/min (by C-G formula based on Cr of 1.46). No results for input(s): VANCOTROUGH, VANCOPEAK, VANCORANDOM, GENTTROUGH, GENTPEAK, GENTRANDOM, TOBRATROUGH, TOBRAPEAK, TOBRARND, AMIKACINPEAK, AMIKACINTROU, AMIKACIN in the last 72 hours.   Microbiology: Recent Results (from the past 720 hour(s))  Culture, blood (Routine X 2) w Reflex to ID Panel     Status: None   Collection Time: 11/06/15  8:27 AM  Result Value Ref Range Status   Specimen Description BLOOD RIGHT HAND  Final   Special Requests BOTTLES DRAWN AEROBIC AND ANAEROBIC 3CC  Final   Culture NO GROWTH 5 DAYS  Final   Report Status 11/11/2015 FINAL  Final  Culture, blood (Routine X 2) w Reflex to ID Panel     Status: None   Collection Time: 11/06/15  8:29 AM  Result Value Ref Range Status   Specimen Description BLOOD LEFT HAND  Final   Special Requests BOTTLES DRAWN AEROBIC AND ANAEROBIC  1CC  Final   Culture NO GROWTH 5 DAYS  Final   Report Status 11/11/2015 FINAL   Final  Culture, expectorated sputum-assessment     Status: None   Collection Time: 11/06/15  8:41 AM  Result Value Ref Range Status   Specimen Description EXPECTORATED SPUTUM  Final   Special Requests Immunocompromised  Final   Sputum evaluation THIS SPECIMEN IS ACCEPTABLE FOR SPUTUM CULTURE  Final   Report Status 11/06/2015 FINAL  Final  Culture, respiratory (NON-Expectorated)     Status: None   Collection Time: 11/06/15  8:41 AM  Result Value Ref Range Status   Specimen Description EXPECTORATED SPUTUM  Final   Special Requests Immunocompromised Reflexed from [id number]  Final   Gram Stain   Final    MANY WBC SEEN MANY GRAM NEGATIVE RODS EXCELLENT SPECIMEN - 90-100% WBCS    Culture   Final    MODERATE GROWTH KLEBSIELLA PNEUMONIAE MODERATE GROWTH PSEUDOMONAS AERUGINOSA CRITICAL RESULT CALLED TO, READ BACK BY AND VERIFIED WITH: DR. FITZGERALD AT 1120 11/09/15 DV DR. FITZGERALD REQUESTED ORGANISM BE SENT OUT FOR TIGECYCLINE CEFT/AVIBACTAM AND COLISTIN Sent to [place] for further susceptibility testing. 11/11/15 CTJ    Report Status 11/15/2015 FINAL  Final   Organism ID, Bacteria KLEBSIELLA PNEUMONIAE  Final   Organism ID, Bacteria PSEUDOMONAS AERUGINOSA  Final      Susceptibility   Klebsiella pneumoniae - MIC*    AMPICILLIN >=32 RESISTANT Resistant     CEFAZOLIN >=64 RESISTANT  Resistant     CEFTRIAXONE 32 RESISTANT Resistant     CIPROFLOXACIN >=4 RESISTANT Resistant     GENTAMICIN >=16 RESISTANT Resistant     IMIPENEM >=16 RESISTANT Resistant     NITROFURANTOIN 256 RESISTANT Resistant     TRIMETH/SULFA >=320 RESISTANT Resistant     Extended ESBL POSITIVE Resistant     * MODERATE GROWTH KLEBSIELLA PNEUMONIAE   Pseudomonas aeruginosa - MIC*    CEFTAZIDIME >=64 RESISTANT Resistant     CIPROFLOXACIN 2 INTERMEDIATE Intermediate     GENTAMICIN >=16 RESISTANT Resistant     IMIPENEM >=16 RESISTANT Resistant     * MODERATE GROWTH PSEUDOMONAS AERUGINOSA  Culture, respiratory  (NON-Expectorated)     Status: None   Collection Time: 11/14/15  2:07 PM  Result Value Ref Range Status   Specimen Description TRACHEAL ASPIRATE  Final   Special Requests NONE  Final   Gram Stain   Final    EXCELLENT SPECIMEN - 90-100% WBCS MANY WBC SEEN MODERATE GRAM NEGATIVE RODS    Culture   Final    HEAVY GROWTH PSEUDOMONAS AERUGINOSA CRITICAL VALUE NOTED.  VALUE IS CONSISTENT WITH PREVIOUSLY REPORTED AND CALLED VALUE.    Report Status 11/16/2015 FINAL  Final   Organism ID, Bacteria PSEUDOMONAS AERUGINOSA  Final      Susceptibility   Pseudomonas aeruginosa - MIC*    CEFTAZIDIME >=64 RESISTANT Resistant     CIPROFLOXACIN 2 INTERMEDIATE Intermediate     GENTAMICIN >=16 RESISTANT Resistant     IMIPENEM >=16 RESISTANT Resistant     PIP/TAZO Value in next row Resistant      RESISTANT>=128    TOBRAMYCIN Value in next row Sensitive      SENSITIVE2    * HEAVY GROWTH PSEUDOMONAS AERUGINOSA    Medical History: Past Medical History  Diagnosis Date  . Obesity   . Dyslipidemia   . Hypertension   . Coronary artery disease     s/p BMS 2010 LAD  . Dysrhythmia     ventricular tachycardia resolved after LAD stent and beta blocker  . Diabetes mellitus     with retinopathy, neuropathy and microalbuminemia  . 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 Patient is on day 104 of hospital stay, and has been treated for multiple infections during this time.   11/06/15: Respiratory cultures showing Pan-resistant  Klebsiella pneumoniae and Pseudomonas aeruginosa  Sputum has been sent to [facility] for susceptibility testing per Dr. Blane Ohara request.   Patient has increased respiratory secretions, leukocytosis and low grade fever   12/30: Alk phos elevated at 1309  Plan:  Per microbiology, Klebsiella is sensitive to Tygacil. Sensitivity results for Avycaz  and colistin still  pending.   Continue tigecycline 57m IV q12h.   Continue  colistimethate 2533m(~2.5 mg/kg) IV q48 hours.   Patient should be monitored for nephrotoxicity and neurotoxicity.  Pharmacy to continue to follow per consult.      ScUlice DashPharmD Clinical Pharmacist   11/23/2015 11:23 AM

## 2015-11-23 NOTE — Progress Notes (Signed)
Patients spouse called for update. He wanted to know how much oxygen she is now on, which is 40%, he questioned about her being down on 100%. I let him know that she was titrated down over a period of 2 days. He then wanted to know if she was still on a pain patch, she is still on the lidocaine patch which he was referring to which I confirmed she was still on. He wanted to know if we could move that patch from her right hip to above the sacral wound. Let him know that I would discuss that with the physician. He also had questions about the antibiotics that she has been on as he wants to know if there is a better combination of medications to help with her infection. He requested that we please keep her head of bed up to 40-45% to assist with her breathing efforts. He requested that Dr. Sung AmabileSimonds and renal physician please call him to discuss her care. He reported that she had to have a chest tube a couple of years back and that he wondered if she needs a "needle to pull the fluid out". Let him know that would be a discussion for the physician and that I would let them know to give him a call.

## 2015-11-23 NOTE — Progress Notes (Signed)
PULMONARY / CRITICAL CARE MEDICINE   Name: Courtney [name] MRN: [id number] DOB: 22-Feb-1966    ADMISSION DATE:  07/25/2015  BRIEF HISTORY: 49 AAF who has been in medical facilities (hosp, LTAC, rehab) for 2 yrs following gastric bypass surgery with multiple complications. Now with chronic trach, ESRD, profound debilitation, severe sacral pressure ulcer. Was seemingly making progress and transferred to rehab facility approx one week prior to this admission. She was sent to [facility] ED with AMS and hypotension. Working dx of severe sepsis/septic shock due to infected sacral pressure ulcer. Since admission. Her course is been very complicated with numerous complications including septic shock and GI bleeding. Now with failure to wean from vent and possible MI event, repeat ECHO EF 30-35, possible old MI, severe hypokinesis of the nnterior and anteroseptal myocardium   SUBJ: RASS 0. Appears uncomfortable. Communicating appropriately with RN this AM.   Summary of MAJOR EVENTS/TEST RESULTS: Admission 02/07/14-05/07/14 Admission 07/21/14-09/06/14 Discharged to [facility]. Pt had palliative consult at that time, were asked to sign off by husband.  09/23 CT head: NAD 09/23 EEG: no epileptiform activity 09/23 PRBCs for Hgb 6.4 09/24 bedside debridement of sacral wound. Abscess drained 09/25 Off vasopressors. More alert. No distress. Worsening thrombocytopenia. Vanc DC'd 09/29 Dr. Sampson Goon (I.D) excused from the case by patient's husband. 10/02 MRI -multiple infarcts 10/03 tracheal bleeding- transfused platelets 10/03 hospitalist service excused from the case by patient's husband 10/03 Echocardiogram ejection fraction was 55-60%, pulmonary systolic pressure was 39 mmHg 95/62 restart TF's at lower rate, attempt reg HD  10/12 Transferred to med-surg floor. Remains on PCCM service 10/14 SLP eval: pt unable to tolerate PMV adequately 10/17-will re-attempt PM valve-discussed with Speech therapist 10/18  passed swallow eval-start pureed thick foods no thin liquids-continue NG feeds 10/19 transferred to step down for sepsis/aspiration pneumonia 10/19 cxr shows RLL opacity 10/21 sacral decub debride at bedside by surgery 10/22 started back on vasopressors while on HD 10/27 CT with osteo, R hip fx, unable to identify tip of dubhoff tube - sent for fluoro study 10/28 Ortho consultation: I do not feel that she is a surgical candidate. Therefore, I feel that it would best to manage this fracture nonsurgically and allow it to heal by itself over time, which it should.  10/28 Gen Surg consultation: Due to the lack of free air or free flow of contrast and the peritoneum there is no indication for any surgical intervention on this. Would recommend pulling back the feeding tube 1-2 cm. Would recheck an abdominal film to confirm no pneumoperitoneum in the morning. Absence of any changes okay to continue using Dobbhoff for feeding and medications. 10/28 gastrograffin study: The study confirms that the feeding tube pes perforated through the duodenum and the tip is within a cavity that fills with injected contrast. The cavity does appear walled off 11/4 refuses oral feeds, continue TF's CT reviewed: T8-T9 discitis/osteomyelitis, RT HIP FRACTURE 11/5 placed back on vasopressors, placed back on Vent due to resp acidosis, levophed turned off 11/6 afternoon 11/5 PM Trach changed out due to cuff leak, #6 Shiley cuffed, 11/6 dubhoff occluded, removed and replaced, new dubhoff shows tube in the antral stomach 11/15 refusing to take oral feeds 11/18 placed back on Vent for increased WOB,SOB. 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 10/18/2015; the patient developed a new left basal pneumonia; with recurrent sepsis.  10/19/2015; fevers resolved with IV acetaminophen. Tube feeds changed  from vital to Jevity.  12/7 CXR with stable b/l  opacities.  12/12 elevated K s/p HD-remains on AC mode 12/16-vomiting-hold feeds and re-assess in next 24 hrs 12/17- TF restarted at 1/2 goal (goal = 50cc/per) 12/18- elevated wbc, fever (101.4), recultured, restarted on ceftaz 12/19 Pericardial friction rub. Echocardiogram: LVEF 30-35%, anteroseptal hypokinesis or akinesis c/w anterior wall MI 12/20 Dr Sung Amabile discussed new echocardiogram findings with pt's husband and explained that this likely represents an anterior wall MI sometime in the previous couple of weeks. It was explained that she is not a candidate for any cardiology intervention. Code status was again discussed. Husband expressed understanding that she was not going to survive this illness in any favorable way but continues to request that she be full code status based on what he believes to be her wishes 12/21 vomitied 50cc of TF - AM TF held, restart TF at 3pm @30cc  12/26 Persistent intermittent trach cuff leak. Distal XLT trach requested.  12/27 15 beat NSVT 12/28 patient refused HD and refused Bath time 12/29 D10 infusion for hypoglycemia, TF's on hold due to intolerance 12/30 CODE BLUE  VTACH 01/01 transfuse 2 units of blood 01/02 increased respiratory distress, copious purulent secretions 01/02 LE venous US: No DVT 01/03 Repeat TTE: The cavity size was mildly dilated. Systolic function was moderately to severely reduced. The estimated ejection fraction was in the range of 30% to 35%. Diffuse hypokinesis. Moderate to severe hypokinesis of the anteroseptal, anterior, and apical myocardium. Hypokinesis of the anterior myocardium   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 History of sepsis from C. glabrata MRSA PCR 9/23 >> NEG Wound (swab) 9/23 >> multiple  organisms Wound (debridement) 9/24 >> Enterococcus, K. Pneumoniae, P. Mirabilis, VRE Wound 9/26 >> No growth Blood 9/23 >> NEG CDiff 9/27>>neg Stool Cx 10/15>> negative Cdiff 10/25>>neg Trach Aspirate 10/28>> light growth pseudomonas Blood 10/28 >> 1/2 GPC >>  Sputum cultures obtained 09/22/15 due to mucus plugging>>Pseudomonas BONE TISSUE Cx 11/3>>E.coli (ESBL), k. Pneumonia (daptomycin and septra) Bld Cx 11/27>>negative thus far.  Trach Asp 11/27>> grew out Pseudomonas, and Klebsiella pneumonia. Trach Asp 12/18>> MDR Klebsiella, Pseudomonas Bld Cx 12/18>> NEG 12/26 resp culture >> MDR pseudomonas  ANTIMICROBIALS:  Aztreonam 9/23 >> 9/24 Vanc 9/23 >> 9/25 Vanc 9/26>>9/27 Daptomycin 9/27>> 10/12, 10/28>> off Meropenem 9/23 >> 10/14, 10/28 >> 10/31 Zosyn 11/27,>> 11/30. Levaquin 11/29>> 11/29. Ceftaz 12/1>>12/11, 12/18>> 12/24 Colisitin 12/26 >>  Tigecycline 12/26 >>  Nebulized tobramycin 12/30 >>  VITAL SIGNS: Temp:  [97.3 F (36.3 C)-97.7 F (36.5 C)] 97.7 F (36.5 C) (01/04 0800) Pulse Rate:  [33-63] 57 (01/04 1000) Resp:  [15-23] 20 (01/04 1000) BP: (92-123)/(49-105) 118/61 mmHg (01/04 1000) SpO2:  [94 %-100 %] 97 % (01/04 1000) FiO2 (%):  [40 %-60 %] 40 % (01/04 0800) HEMODYNAMICS:   VENTILATOR SETTINGS: Vent Mode:  [-] PRVC FiO2 (%):  [40 %-60 %] 40 % Set Rate:  [14 bmp] 14 bmp Vt Set:  [530 mL] 530 mL PEEP:  [8 cmH20] 8 cmH20 Plateau Pressure:  [38 cmH20] 38 cmH20 INTAKE / OUTPUT:  Intake/Output Summary (Last 24 hours) at 11/23/15 1137 Last data filed at 11/23/15 0800  Gross per 24 hour  Intake 1147.23 ml  Output      0 ml  Net 1147.23 ml    Review  of Systems  Unable to perform ROS  Physical Exam  RASS 0, intermittent trach tube cuff leak, intermittent dyssynchrony with vent HEENT; Cushingoid facies, temporal wasting, NGT present Trach site clean Diffuse rhonchi Reg with occ extrasystoles, no M Abdomen soft, + BS Ext cool, trace symmetric  edema, marked muscle wasting Severe sacral decubitus ulcer-   LABS:  CBC  Recent Labs Lab 11/21/15 1026 11/21/15 2012 11/22/15 0925  WBC 19.9* 24.0* 18.1*  HGB 9.7* 10.1* 9.6*  HCT 30.8* 32.1* 30.7*  PLT 186 170 153   Coag's  Recent Labs Lab 11/21/15 2012  APTT 41*  INR 1.57   BMET  Recent Labs Lab 11/19/15 0630 11/21/15 0147 11/21/15 0434 11/21/15 1318  NA 136 133*  --  131*  K 3.5 3.6  --  3.6  CL 100* 97*  --  96*  CO2 27 28  --  28  BUN 45* 67*  --  72*  CREATININE 1.06* 1.25* 1.42* 1.46*  GLUCOSE 83 133*  --  117*   Electrolytes  Recent Labs Lab 11/18/15 0830  11/19/15 0630 11/21/15 0147 11/21/15 1318  CALCIUM 8.2*  < > 8.1* 8.0* 8.0*  MG 1.7  < > 2.1 2.1 2.1  PHOS 4.1  --   --   --   --   < > = values in this interval not displayed. Sepsis Markers No results for input(s): LATICACIDVEN, PROCALCITON, O2SATVEN in the last 168 hours. ABG  Recent Labs Lab 11/21/15 1745  PHART 7.41  PCO2ART 47  PO2ART 78*   Liver Enzymes  Recent Labs Lab 11/18/15 0830  AST 17  ALT 13*  ALKPHOS 1309*  BILITOT 0.7  ALBUMIN 1.6*   Cardiac Enzymes  Recent Labs Lab 11/21/15 2012 11/22/15 0202 11/22/15 0925  TROPONINI 0.05* 0.16* 0.06*   Glucose  Recent Labs Lab 11/22/15 0731 11/22/15 1132 11/22/15 1654 11/22/15 2018 11/23/15 0035 11/23/15 0733  GLUCAP 73 103* 93 96 92 81    Imaging No results found.  ASSESSMENT / PLAN: IMPRESSION: Very prolonged and complicated hospitalization after gastric bypass surgery 2014 Never returned home ESRD Recurrent severe sepsis - PAN resistant Klebisella in sputum Recurrent PNA-now with pseudomonas H/O C diff H/O VTE DM2 - controlled Episodic hypoglycemia Chronic steroid therapy, for a history of of adrenal insufficiency Incidental finding of R hip fx - conservative mgmt. 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 Husband declined TEE Profound deconditioning Chronic pain New finding of pericardial friction rub 12/19 - resolved New finding of cardiomyopathy, likely post MI 12/19  PAF/flutter Sinus bradycardia HCAP with MDR Donalee CitrinKlebs and pseudomonas Chronic anemia Severe protein - calorie malnutrition TF intolerance  Cont vent support - settings reviewed and/or adjusted Cont vent bundle Cont HD per renal Monitor BMET intermittently Monitor I/Os Correct electrolytes as indicated DVT px: SQ heparin Monitor CBC intermittently Transfuse per usual guidelines Resume TF's at low rate Monitor temp, WBC count Micro and abx as above ID following - appreciate recs Amiodarone dose adjusted - 100 mg per tube daily DC metoprolol Increase opiates Cont D10 Infusion for hypoglycemia Check NGT position - KUB ordered Resume TFs @ 10 cc/hr   Prognosis remains very poor without hope for functional recovery.   CCM time: 30 mins The above time includes time spent in consultation with patient and/or family  members and reviewing care plan on multidisciplinary rounds  Billy Fischer, MD PCCM service Mobile [phone] Pager [phone]

## 2015-11-24 LAB — MISC LABCORP TEST (SEND OUT): Labcorp test code: 88013

## 2015-11-24 LAB — GLUCOSE, CAPILLARY
GLUCOSE-CAPILLARY: 92 mg/dL (ref 65–99)
GLUCOSE-CAPILLARY: 85 mg/dL (ref 65–99)
Glucose-Capillary: 90 mg/dL (ref 65–99)
Glucose-Capillary: 83 mg/dL (ref 65–99)

## 2015-11-24 MED ORDER — VITAL AF 1.2 CAL PO LIQD
1000.0000 mL | ORAL | Status: DC
  Administered 2015-11-24 – 2015-11-29 (×4): 1000 mL

## 2015-11-24 MED ORDER — SODIUM CHLORIDE 0.9 % IV SOLN
200.0000 mg | Freq: Three times a day (TID) | INTRAVENOUS | Status: DC
Start: 2015-11-24 — End: 2015-11-28
  Administered 2015-11-24 – 2015-11-28 (×13): 200 mg via INTRAVENOUS
  Filled 2015-11-24 (×16): qty 4

## 2015-11-24 NOTE — Progress Notes (Signed)
Pt temperature 93.1 axillary, notified Dr. Sung AmabileSimonds.  Order for warming blanket, blanket applied to pt.

## 2015-11-24 NOTE — Progress Notes (Signed)
Tube feedings have arrived from dining services.  Zach (night shift RN) is going to start tube feedings.

## 2015-11-24 NOTE — Progress Notes (Signed)
MEDICATION RELATED CONSULT NOTE - Follow up   Pharmacy Consult for Renal dosing  Allergies  Allergen Reactions  . Contrast Media [Iodinated Diagnostic Agents] Anaphylaxis  . Ampicillin Rash   Patient Measurements: Ht 395ft 9in Height:  (SCALE BROKE) Weight: 224 lb 13.9 oz (102 kg) IBW/kg (Calculated) : 66.2  Vital Signs: Temp: 98.1 F (36.7 C) (01/05 1600) Temp Source: Axillary (01/05 1600) BP: 107/58 mmHg (01/05 1600) Pulse Rate: 72 (01/05 1600)  Recent Labs  11/21/15 2012 11/22/15 0925 11/23/15 1339  WBC 24.0* 18.1* 10.6  HGB 10.1* 9.6* 9.8*  HCT 32.1* 30.7* 31.7*  PLT 170 153 119*  APTT 41*  --   --   CREATININE  --   --  1.45*  PHOS  --   --  5.2*  ALBUMIN  --   --  1.5*   Estimated Creatinine Clearance: 59 mL/min (by C-G formula based on Cr of 1.45).  Assessment: 50 yo patient on HD. Pharmacy consulted for renal dosing of medications.   Plan:  No medications require adjustment at present. Pharmacy will continue to monitor and dose adjust as needed.     Charlies SilversMichael Simpson, PharmD   11/24/2015

## 2015-11-24 NOTE — Progress Notes (Signed)
Subjective:   Hemodialysis yesterday. UF of 2.5 litres with albumin given.   Objective:  Vital signs in last 24 hours:  Temp:  [97.3 F (36.3 C)-97.8 F (36.6 C)] 97.5 F (36.4 C) (01/05 0406) Pulse Rate:  [34-72] 65 (01/05 0800) Resp:  [0-26] 26 (01/05 0800) BP: (84-130)/(33-116) 125/56 mmHg (01/05 0800) SpO2:  [90 %-100 %] 100 % (01/05 0800) FiO2 (%):  [40 %] 40 % (01/05 0858)  Weight change:  Filed Weights   11/21/15 0500 11/21/15 1540 11/22/15 0500  Weight: 107 kg (235 lb 14.3 oz) 107 kg (235 lb 14.3 oz) 102 kg (224 lb 13.9 oz)    Intake/Output: I/O last 3 completed shifts: In: 1558.3 [I.V.:875; NG/GT:380; IV Piggyback:303.3] Out: -1500    Intake/Output this shift:     Physical Exam: General: critically ill appearing  Head/ENT: OM moist, NG tube  Eyes: Anicteric, eyes closed  Neck: Tracheostomy in place  Lungs:  bilatearl rhonchi, vent assisted PRVC fio2 40%  Heart: S1S2 no rubs  Abdomen:  Soft, nontender, BS present  Extremities: 2+ dependent edema, anasarca  Neurologic: Resting comfortably at the moment  Access:  R IJ permcath.       Basic Metabolic Panel:  Recent Labs Lab 11/18/15 0830 11/18/15 1810 11/19/15 0630 11/21/15 0147 11/21/15 0434 11/21/15 1318 11/23/15 1339  NA 139 137 136 133*  --  131* 131*  K 3.7 3.2* 3.5 3.6  --  3.6 3.8  CL 101 101 100* 97*  --  96* 96*  CO2 28 30 27 28   --  28 29  GLUCOSE 109* 105* 83 133*  --  117* 101*  BUN 58* 41* 45* 67*  --  72* 72*  CREATININE 1.29* 0.98 1.06* 1.25* 1.42* 1.46* 1.45*  CALCIUM 8.2* 8.1* 8.1* 8.0*  --  8.0* 8.1*  MG 1.7 1.6* 2.1 2.1  --  2.1  --   PHOS 4.1  --   --   --   --   --  5.2*    Liver Function Tests:  Recent Labs Lab 11/18/15 0830 11/23/15 1339  AST 17  --   ALT 13*  --   ALKPHOS 1309*  --   BILITOT 0.7  --   PROT 5.6*  --   ALBUMIN 1.6* 1.5*   No results for input(s): LIPASE, AMYLASE in the last 168 hours. No results for input(s): AMMONIA in the last 168  hours.  CBC:  Recent Labs Lab 11/19/15 0630 11/20/15 0948 11/21/15 1026 11/21/15 2012 11/22/15 0925 11/23/15 1339  WBC 20.5* 20.7* 19.9* 24.0* 18.1* 10.6  NEUTROABS 17.2* 18.2* 17.1*  --   --   --   HGB 7.5* 7.3* 9.7* 10.1* 9.6* 9.8*  HCT 24.6* 24.8* 30.8* 32.1* 30.7* 31.7*  MCV 94.5 94.0 91.5 91.2 90.5 91.1  PLT 229 229 186 170 153 119*    Cardiac Enzymes:  Recent Labs Lab 11/19/15 0031 11/19/15 0630 11/21/15 2012 11/22/15 0202 11/22/15 0925  TROPONINI 0.05* 0.13* 0.05* 0.16* 0.06*    BNP: Invalid input(s): POCBNP  CBG:  Recent Labs Lab 11/23/15 1601 11/23/15 1956 11/23/15 2315 11/24/15 0437 11/24/15 0730  GLUCAP 71 77 87 92 90    Microbiology: Results for orders placed or performed during the hospital encounter of September 09, 2015  Blood Culture (routine x 2)     Status: None   Collection Time: 09-09-2015  8:51 AM  Result Value Ref Range Status   Specimen Description BLOOD [name]  Final   Special Requests BOTTLES DRAWN  AEROBIC AND ANAEROBIC  3CC  Final   Culture NO GROWTH 5 DAYS  Final   Report Status 08/17/2015 FINAL  Final  Blood Culture (routine x 2)     Status: None   Collection Time: September 02, 2015  9:20 AM  Result Value Ref Range Status   Specimen Description BLOOD LEFT ARM  Final   Special Requests BOTTLES DRAWN AEROBIC AND ANAEROBIC  1CC  Final   Culture NO GROWTH 5 DAYS  Final   Report Status 08/17/2015 FINAL  Final  Wound culture     Status: None   Collection Time: 09/02/2015  9:20 AM  Result Value Ref Range Status   Specimen Description DECUBITIS  Final   Special Requests Normal  Final   Gram Stain   Final    FEW WBC SEEN MANY GRAM NEGATIVE RODS RARE GRAM POSITIVE COCCI    Culture   Final    HEAVY GROWTH ESCHERICHIA COLI MODERATE GROWTH ENTEROBACTER AEROGENES PROTEUS MIRABILIS HEAVY GROWTH ENTEROCOCCUS SPECIES VRE HAVE INTRINSIC RESISTANCE TO MOST COMMONLY USED ANTIBIOTICS AND THE ABILITY TO ACQUIRE RESISTANCE TO MOST AVAILABLE ANTIBIOTICS.     Report Status 08/16/2015 FINAL  Final   Organism ID, Bacteria ESCHERICHIA COLI  Final   Organism ID, Bacteria ENTEROBACTER AEROGENES  Final   Organism ID, Bacteria PROTEUS MIRABILIS  Final   Organism ID, Bacteria ENTEROCOCCUS SPECIES  Final      Susceptibility   Enterobacter aerogenes - MIC*    CEFTAZIDIME <=1 SENSITIVE Sensitive     CEFAZOLIN >=64 RESISTANT Resistant     CEFTRIAXONE <=1 SENSITIVE Sensitive     CIPROFLOXACIN <=0.25 SENSITIVE Sensitive     GENTAMICIN <=1 SENSITIVE Sensitive     IMIPENEM 1 SENSITIVE Sensitive     TRIMETH/SULFA <=20 SENSITIVE Sensitive     * MODERATE GROWTH ENTEROBACTER AEROGENES   Escherichia coli - MIC*    AMPICILLIN <=2 SENSITIVE Sensitive     CEFTAZIDIME <=1 SENSITIVE Sensitive     CEFAZOLIN <=4 SENSITIVE Sensitive     CEFTRIAXONE <=1 SENSITIVE Sensitive     CIPROFLOXACIN <=0.25 SENSITIVE Sensitive     GENTAMICIN <=1 SENSITIVE Sensitive     IMIPENEM <=0.25 SENSITIVE Sensitive     TRIMETH/SULFA <=20 SENSITIVE Sensitive     * HEAVY GROWTH ESCHERICHIA COLI   Proteus mirabilis - MIC*    AMPICILLIN >=32 RESISTANT Resistant     CEFTAZIDIME <=1 SENSITIVE Sensitive     CEFAZOLIN 8 SENSITIVE Sensitive     CEFTRIAXONE <=1 SENSITIVE Sensitive     CIPROFLOXACIN <=0.25 SENSITIVE Sensitive     GENTAMICIN <=1 SENSITIVE Sensitive     IMIPENEM 1 SENSITIVE Sensitive     TRIMETH/SULFA <=20 SENSITIVE Sensitive     * PROTEUS MIRABILIS   Enterococcus species - MIC*    AMPICILLIN >=32 RESISTANT Resistant     VANCOMYCIN >=32 RESISTANT Resistant     GENTAMICIN SYNERGY SENSITIVE Sensitive     TETRACYCLINE Value in next row Resistant      RESISTANT>=16    * HEAVY GROWTH ENTEROCOCCUS SPECIES  MRSA PCR Screening     Status: None   Collection Time: 09/02/15  2:38 PM  Result Value Ref Range Status   MRSA by PCR NEGATIVE NEGATIVE Final    Comment:        The GeneXpert MRSA Assay (FDA approved for NASAL specimens only), is one component of a comprehensive MRSA  colonization surveillance program. It is not intended to diagnose MRSA infection nor to guide or monitor treatment for MRSA infections.  Blood culture (single)     Status: None   Collection Time: Apr 11, 2015  3:36 PM  Result Value Ref Range Status   Specimen Description BLOOD RIGHT ASSIST CONTROL  Final   Special Requests BOTTLES DRAWN AEROBIC AND ANAEROBIC 5ML  Final   Culture NO GROWTH 5 DAYS  Final   Report Status 08/17/2015 FINAL  Final  Wound culture     Status: None   Collection Time: 08/13/15 12:37 PM  Result Value Ref Range Status   Specimen Description WOUND  Final   Special Requests Normal  Final   Gram Stain   Final    FEW WBC SEEN TOO NUMEROUS TO COUNT GRAM NEGATIVE RODS FEW GRAM POSITIVE COCCI    Culture   Final    HEAVY GROWTH ESCHERICHIA COLI MODERATE GROWTH PROTEUS MIRABILIS LIGHT GROWTH KLEBSIELLA PNEUMONIAE MODERATE GROWTH ENTEROCOCCUS GALLINARUM CRITICAL RESULT CALLED TO, READ BACK BY AND VERIFIED WITH: [facility] LLC[facility]DRA BORBA AT 1042 08/16/15 DV    Report Status 08/17/2015 FINAL  Final   Organism ID, Bacteria ESCHERICHIA COLI  Final   Organism ID, Bacteria PROTEUS MIRABILIS  Final   Organism ID, Bacteria KLEBSIELLA PNEUMONIAE  Final   Organism ID, Bacteria ENTEROCOCCUS GALLINARUM  Final      Susceptibility   Escherichia coli - MIC*    AMPICILLIN >=32 RESISTANT Resistant     CEFTAZIDIME 4 RESISTANT Resistant     CEFAZOLIN >=64 RESISTANT Resistant     CEFTRIAXONE 16 RESISTANT Resistant     GENTAMICIN 2 SENSITIVE Sensitive     IMIPENEM >=16 RESISTANT Resistant     TRIMETH/SULFA <=20 SENSITIVE Sensitive     Extended ESBL POSITIVE Resistant     PIP/TAZO Value in next row Resistant      RESISTANT>=128    CIPROFLOXACIN Value in next row Sensitive      SENSITIVE<=0.25    * HEAVY GROWTH ESCHERICHIA COLI   Klebsiella pneumoniae - MIC*    AMPICILLIN Value in next row Resistant      SENSITIVE<=0.25    CEFTAZIDIME Value in next row Resistant      SENSITIVE<=0.25     CEFAZOLIN Value in next row Resistant      SENSITIVE<=0.25    CEFTRIAXONE Value in next row Resistant      SENSITIVE<=0.25    CIPROFLOXACIN Value in next row Resistant      SENSITIVE<=0.25    GENTAMICIN Value in next row Sensitive      SENSITIVE<=0.25    IMIPENEM Value in next row Resistant      SENSITIVE<=0.25    TRIMETH/SULFA Value in next row Resistant      SENSITIVE<=0.25    PIP/TAZO Value in next row Resistant      RESISTANT>=128    * LIGHT GROWTH KLEBSIELLA PNEUMONIAE   Proteus mirabilis - MIC*    AMPICILLIN Value in next row Resistant      RESISTANT>=128    CEFTAZIDIME Value in next row Sensitive      RESISTANT>=128    CEFAZOLIN Value in next row Sensitive      RESISTANT>=128    CEFTRIAXONE Value in next row Sensitive      RESISTANT>=128    CIPROFLOXACIN Value in next row Sensitive      RESISTANT>=128    GENTAMICIN Value in next row Sensitive      RESISTANT>=128    IMIPENEM Value in next row Sensitive      RESISTANT>=128    TRIMETH/SULFA Value in next row Sensitive      RESISTANT>=128  PIP/TAZO Value in next row Sensitive      SENSITIVE<=4    * MODERATE GROWTH PROTEUS MIRABILIS   Enterococcus gallinarum - MIC*    AMPICILLIN Value in next row Resistant      SENSITIVE<=4    GENTAMICIN SYNERGY Value in next row Sensitive      SENSITIVE<=4    CIPROFLOXACIN Value in next row Resistant      RESISTANT>=8    TETRACYCLINE Value in next row Resistant      RESISTANT>=16    * MODERATE GROWTH ENTEROCOCCUS GALLINARUM  Wound culture     Status: None   Collection Time: 08/15/15  2:53 PM  Result Value Ref Range Status   Specimen Description WOUND  Final   Special Requests NONE  Final   Gram Stain FEW WBC SEEN NO ORGANISMS SEEN   Final   Culture NO GROWTH 3 DAYS  Final   Report Status 08/18/2015 FINAL  Final  C difficile quick scan w PCR reflex     Status: None   Collection Time: 08/17/15 11:34 AM  Result Value Ref Range Status   C Diff antigen NEGATIVE NEGATIVE Final    C Diff toxin NEGATIVE NEGATIVE Final   C Diff interpretation Negative for C. difficile  Final  Stool culture     Status: None   Collection Time: 09/03/15  3:51 PM  Result Value Ref Range Status   Specimen Description STOOL  Final   Special Requests Immunocompromised  Final   Culture   Final    NO SALMONELLA OR SHIGELLA ISOLATED No Pathogenic E. coli detected NO CAMPYLOBACTER DETECTED    Report Status 09/07/2015 FINAL  Final  C difficile quick scan w PCR reflex     Status: None   Collection Time: 09/13/15 12:51 AM  Result Value Ref Range Status   C Diff antigen NEGATIVE NEGATIVE Final   C Diff toxin NEGATIVE NEGATIVE Final   C Diff interpretation Negative for C. difficile  Final  Culture, blood (routine x 2)     Status: None   Collection Time: 09/16/15 11:24 AM  Result Value Ref Range Status   Specimen Description BLOOD LEFT HAND  Final   Special Requests BOTTLES DRAWN AEROBIC AND ANAEROBIC  1CC  Final   Culture NO GROWTH 5 DAYS  Final   Report Status 09/21/2015 FINAL  Final  Culture, blood (routine x 2)     Status: None   Collection Time: 09/16/15 12:23 PM  Result Value Ref Range Status   Specimen Description BLOOD RIGHT HAND  Final   Special Requests BOTTLES DRAWN AEROBIC AND ANAEROBIC  1CC  Final   Culture  Setup Time   Final    GRAM POSITIVE COCCI AEROBIC BOTTLE ONLY CRITICAL RESULT CALLED TO, READ BACK BY AND VERIFIED WITH: TESS THOMAS,RN 09/17/2015 0631 BY JRS.    Culture   Final    ENTEROCOCCUS FAECALIS AEROBIC BOTTLE ONLY VRE HAVE INTRINSIC RESISTANCE TO MOST COMMONLY USED ANTIBIOTICS AND THE ABILITY TO ACQUIRE RESISTANCE TO MOST AVAILABLE ANTIBIOTICS. CRITICAL RESULT CALLED TO, READ BACK BY AND VERIFIED WITH: CHERYL SMITH AT 1610 09/19/15 DV    Report Status 09/21/2015 FINAL  Final   Organism ID, Bacteria ENTEROCOCCUS FAECALIS  Final      Susceptibility   Enterococcus faecalis - MIC*    AMPICILLIN <=2 SENSITIVE Sensitive     LINEZOLID 2 SENSITIVE Sensitive      CIPROFLOXACIN Value in next row Resistant      RESISTANT>=8    TETRACYCLINE  Value in next row Resistant      RESISTANT>=16    VANCOMYCIN Value in next row Resistant      RESISTANT>=32    GENTAMICIN SYNERGY Value in next row Resistant      RESISTANT>=32    * ENTEROCOCCUS FAECALIS  Culture, respiratory (NON-Expectorated)     Status: None   Collection Time: 09/16/15  3:50 PM  Result Value Ref Range Status   Specimen Description TRACHEAL ASPIRATE  Final   Special Requests Immunocompromised  Final   Gram Stain   Final    FEW WBC SEEN GOOD SPECIMEN - 80-90% WBCS RARE GRAM NEGATIVE RODS    Culture LIGHT GROWTH PSEUDOMONAS AERUGINOSA  Final   Report Status 09/19/2015 FINAL  Final   Organism ID, Bacteria PSEUDOMONAS AERUGINOSA  Final      Susceptibility   Pseudomonas aeruginosa - MIC*    CEFTAZIDIME 8 SENSITIVE Sensitive     CIPROFLOXACIN 2 INTERMEDIATE Intermediate     GENTAMICIN >=16 RESISTANT Resistant     IMIPENEM >=16 RESISTANT Resistant     PIP/TAZO Value in next row Sensitive      SENSITIVE32    CEFEPIME Value in next row Sensitive      SENSITIVE8    LEVOFLOXACIN Value in next row Resistant      RESISTANT>=8    * LIGHT GROWTH PSEUDOMONAS AERUGINOSA  Culture, expectorated sputum-assessment     Status: None   Collection Time: 09/22/15  2:09 PM  Result Value Ref Range Status   Specimen Description ENDOTRACHEAL  Final   Special Requests Normal  Final   Sputum evaluation THIS SPECIMEN IS ACCEPTABLE FOR SPUTUM CULTURE  Final   Report Status 09/24/2015 FINAL  Final  Culture, respiratory (NON-Expectorated)     Status: None   Collection Time: 09/22/15  2:09 PM  Result Value Ref Range Status   Specimen Description ENDOTRACHEAL  Final   Special Requests Normal Reflexed from [id number]  Final   Gram Stain   Final    FEW WBC SEEN FEW GRAM NEGATIVE RODS POOR SPECIMEN - LESS THAN 70% WBCS    Culture   Final    MODERATE GROWTH PSEUDOMONAS AERUGINOSA LIGHT GROWTH KLEBSIELLA  PNEUMONIAE REFER TO SENSITIVITIES FROM PREVIOUS CULTURE FOR ORG 2    Report Status 10/01/2015 FINAL  Final   Organism ID, Bacteria PSEUDOMONAS AERUGINOSA  Final   Organism ID, Bacteria KLEBSIELLA PNEUMONIAE  Final      Susceptibility   Pseudomonas aeruginosa - MIC*    CEFTAZIDIME 4 SENSITIVE Sensitive     CIPROFLOXACIN 2 INTERMEDIATE Intermediate     GENTAMICIN >=16 RESISTANT Resistant     IMIPENEM >=16 RESISTANT Resistant     PIP/TAZO Value in next row Sensitive      SENSITIVE16    * MODERATE GROWTH PSEUDOMONAS AERUGINOSA  Tissue culture     Status: None   Collection Time: 09/24/15  6:44 AM  Result Value Ref Range Status   Specimen Description BONE  Final   Special Requests Normal  Final   Gram Stain MODERATE WBC SEEN FEW GRAM NEGATIVE RODS   Final   Culture   Final    MODERATE GROWTH ESCHERICHIA COLI MODERATE GROWTH KLEBSIELLA PNEUMONIAE ESBL-EXTENDED SPECTRUM BETA LACTAMASE-THE ORGANISM IS RESISTANT TO PENICILLINS, CEPHALOSPORINS AND AZTREONAM ACCORDING TO CLSI M100-S15 VOL.25 N01 JAN 2005. ORGANISM 1 This organism isolate is resistant to one or more antiotic agents in three or more antimicrobial categories.  Suggest Infectious Disease consult.   ORGANISM 2 CRITICAL RESULT CALLED TO, READ  BACK BY AND VERIFIED WITH: RN Mardene Celeste LINDSAY 09/27/15 1005AM    Report Status 09/28/2015 FINAL  Final   Organism ID, Bacteria ESCHERICHIA COLI  Final   Organism ID, Bacteria KLEBSIELLA PNEUMONIAE  Final      Susceptibility   Escherichia coli - MIC*    AMPICILLIN >=32 RESISTANT Resistant     CEFTAZIDIME 4 RESISTANT Resistant     CEFAZOLIN >=64 RESISTANT Resistant     CEFTRIAXONE 8 RESISTANT Resistant     GENTAMICIN <=1 SENSITIVE Sensitive     IMIPENEM 8 RESISTANT Resistant     TRIMETH/SULFA <=20 SENSITIVE Sensitive     Extended ESBL POSITIVE Resistant     PIP/TAZO Value in next row Resistant      RESISTANT>=128    * MODERATE GROWTH ESCHERICHIA COLI   Klebsiella pneumoniae - MIC*     AMPICILLIN Value in next row Resistant      RESISTANT>=128    CEFTAZIDIME Value in next row Resistant      RESISTANT>=128    CEFAZOLIN Value in next row Resistant      RESISTANT>=128    CEFTRIAXONE Value in next row Resistant      RESISTANT>=128    CIPROFLOXACIN Value in next row Resistant      RESISTANT>=128    GENTAMICIN Value in next row Resistant      RESISTANT>=128    IMIPENEM Value in next row Resistant      RESISTANT>=128    TRIMETH/SULFA Value in next row Sensitive      RESISTANT>=128    PIP/TAZO Value in next row Resistant      RESISTANT>=128    * MODERATE GROWTH KLEBSIELLA PNEUMONIAE  Anaerobic culture     Status: None   Collection Time: 09/24/15  3:55 PM  Result Value Ref Range Status   Specimen Description BONE  Final   Special Requests Normal  Final   Culture NO ANAEROBES ISOLATED  Final   Report Status 09/29/2015 FINAL  Final  Culture, fungus without smear     Status: None   Collection Time: 09/24/15  3:55 PM  Result Value Ref Range Status   Specimen Description BONE  Final   Special Requests Normal  Final   Culture NO FUNGUS ISOLATED AFTER 24 DAYS  Final   Report Status 10/18/2015 FINAL  Final  C difficile quick scan w PCR reflex     Status: None   Collection Time: 10/07/15  2:02 PM  Result Value Ref Range Status   C Diff antigen NEGATIVE NEGATIVE Final   C Diff toxin NEGATIVE NEGATIVE Final   C Diff interpretation Negative for C. difficile  Final  Culture, blood (routine x 2)     Status: None   Collection Time: 10/16/15  9:52 AM  Result Value Ref Range Status   Specimen Description BLOOD LEFT HAND  Final   Special Requests   Final    BOTTLES DRAWN AEROBIC AND ANAEROBIC  AER 6CC ANA 4CC   Culture NO GROWTH 6 DAYS  Final   Report Status 10/22/2015 FINAL  Final  Culture, blood (routine x 2)     Status: None   Collection Time: 10/16/15 12:25 PM  Result Value Ref Range Status   Specimen Description BLOOD LEFT HAND  Final   Special Requests BOTTLES DRAWN  AEROBIC AND ANAEROBIC  5CC  Final   Culture NO GROWTH 6 DAYS  Final   Report Status 10/22/2015 FINAL  Final  Culture, expectorated sputum-assessment     Status: None  Collection Time: 10/18/15  1:15 PM  Result Value Ref Range Status   Specimen Description SPUTUM  Final   Special Requests NONE  Final   Sputum evaluation THIS SPECIMEN IS ACCEPTABLE FOR SPUTUM CULTURE  Final   Report Status 10/18/2015 FINAL  Final  Culture, respiratory (NON-Expectorated)     Status: None   Collection Time: 10/18/15  1:15 PM  Result Value Ref Range Status   Specimen Description SPUTUM  Final   Special Requests NONE Reflexed from [id number]  Final   Gram Stain   Final    GOOD SPECIMEN - 80-90% WBCS MODERATE WBC SEEN MANY GRAM NEGATIVE RODS    Culture   Final    HEAVY GROWTH PSEUDOMONAS AERUGINOSA MODERATE GROWTH KLEBSIELLA PNEUMONIAE CRITICAL RESULT CALLED TO, READ BACK BY AND VERIFIED WITHRod Mae AT 4540 10/21/15 DV KLEBSIELLA PNEUMONIAE This organism isolate is resistant to one or more antiotic agents in three or more antimicrobial categories.  Suggest Infectious Disease  consult.      Report Status 10/22/2015 FINAL  Final   Organism ID, Bacteria PSEUDOMONAS AERUGINOSA  Final   Organism ID, Bacteria KLEBSIELLA PNEUMONIAE  Final      Susceptibility   Klebsiella pneumoniae - MIC*    AMPICILLIN >=32 RESISTANT Resistant     CEFAZOLIN >=64 RESISTANT Resistant     CEFTRIAXONE >=64 RESISTANT Resistant     CIPROFLOXACIN >=4 RESISTANT Resistant     GENTAMICIN >=16 RESISTANT Resistant     IMIPENEM >=16 RESISTANT Resistant     NITROFURANTOIN >=512 RESISTANT Resistant     TRIMETH/SULFA <=20 SENSITIVE Sensitive     * MODERATE GROWTH KLEBSIELLA PNEUMONIAE   Pseudomonas aeruginosa - MIC*    CEFTAZIDIME 4 SENSITIVE Sensitive     CIPROFLOXACIN >=4 RESISTANT Resistant     GENTAMICIN 8 INTERMEDIATE Intermediate     IMIPENEM >=16 RESISTANT Resistant     PIP/TAZO Value in next row Sensitive       SENSITIVE32    * HEAVY GROWTH PSEUDOMONAS AERUGINOSA  C difficile quick scan w PCR reflex     Status: None   Collection Time: 10/18/15  4:39 PM  Result Value Ref Range Status   C Diff antigen NEGATIVE NEGATIVE Final   C Diff toxin NEGATIVE NEGATIVE Final   C Diff interpretation Negative for C. difficile  Final  Culture, blood (Routine X 2) w Reflex to ID Panel     Status: None   Collection Time: 11/06/15  8:27 AM  Result Value Ref Range Status   Specimen Description BLOOD RIGHT HAND  Final   Special Requests BOTTLES DRAWN AEROBIC AND ANAEROBIC 3CC  Final   Culture NO GROWTH 5 DAYS  Final   Report Status 11/11/2015 FINAL  Final  Culture, blood (Routine X 2) w Reflex to ID Panel     Status: None   Collection Time: 11/06/15  8:29 AM  Result Value Ref Range Status   Specimen Description BLOOD LEFT HAND  Final   Special Requests BOTTLES DRAWN AEROBIC AND ANAEROBIC  1CC  Final   Culture NO GROWTH 5 DAYS  Final   Report Status 11/11/2015 FINAL  Final  Culture, expectorated sputum-assessment     Status: None   Collection Time: 11/06/15  8:41 AM  Result Value Ref Range Status   Specimen Description EXPECTORATED SPUTUM  Final   Special Requests Immunocompromised  Final   Sputum evaluation THIS SPECIMEN IS ACCEPTABLE FOR SPUTUM CULTURE  Final   Report Status 11/06/2015 FINAL  Final  Culture,  respiratory (NON-Expectorated)     Status: None   Collection Time: 11/06/15  8:41 AM  Result Value Ref Range Status   Specimen Description EXPECTORATED SPUTUM  Final   Special Requests Immunocompromised Reflexed from [id number]  Final   Gram Stain   Final    MANY WBC SEEN MANY GRAM NEGATIVE RODS EXCELLENT SPECIMEN - 90-100% WBCS    Culture   Final    MODERATE GROWTH KLEBSIELLA PNEUMONIAE MODERATE GROWTH PSEUDOMONAS AERUGINOSA CRITICAL RESULT CALLED TO, READ BACK BY AND VERIFIED WITH: DR. FITZGERALD AT 1120 11/09/15 DV DR. FITZGERALD REQUESTED ORGANISM BE SENT OUT FOR TIGECYCLINE CEFT/AVIBACTAM AND  COLISTIN Sent to [facility] for further susceptibility testing. 11/11/15 CTJ    Report Status 11/15/2015 FINAL  Final   Organism ID, Bacteria KLEBSIELLA PNEUMONIAE  Final   Organism ID, Bacteria PSEUDOMONAS AERUGINOSA  Final      Susceptibility   Klebsiella pneumoniae - MIC*    AMPICILLIN >=32 RESISTANT Resistant     CEFAZOLIN >=64 RESISTANT Resistant     CEFTRIAXONE 32 RESISTANT Resistant     CIPROFLOXACIN >=4 RESISTANT Resistant     GENTAMICIN >=16 RESISTANT Resistant     IMIPENEM >=16 RESISTANT Resistant     NITROFURANTOIN 256 RESISTANT Resistant     TRIMETH/SULFA >=320 RESISTANT Resistant     Extended ESBL POSITIVE Resistant     * MODERATE GROWTH KLEBSIELLA PNEUMONIAE   Pseudomonas aeruginosa - MIC*    CEFTAZIDIME >=64 RESISTANT Resistant     CIPROFLOXACIN 2 INTERMEDIATE Intermediate     GENTAMICIN >=16 RESISTANT Resistant     IMIPENEM >=16 RESISTANT Resistant     * MODERATE GROWTH PSEUDOMONAS AERUGINOSA  Culture, respiratory (NON-Expectorated)     Status: None   Collection Time: 11/14/15  2:07 PM  Result Value Ref Range Status   Specimen Description TRACHEAL ASPIRATE  Final   Special Requests NONE  Final   Gram Stain   Final    EXCELLENT SPECIMEN - 90-100% WBCS MANY WBC SEEN MODERATE GRAM NEGATIVE RODS    Culture   Final    HEAVY GROWTH PSEUDOMONAS AERUGINOSA CRITICAL VALUE NOTED.  VALUE IS CONSISTENT WITH PREVIOUSLY REPORTED AND CALLED VALUE.    Report Status 11/16/2015 FINAL  Final   Organism ID, Bacteria PSEUDOMONAS AERUGINOSA  Final      Susceptibility   Pseudomonas aeruginosa - MIC*    CEFTAZIDIME >=64 RESISTANT Resistant     CIPROFLOXACIN 2 INTERMEDIATE Intermediate     GENTAMICIN >=16 RESISTANT Resistant     IMIPENEM >=16 RESISTANT Resistant     PIP/TAZO Value in next row Resistant      RESISTANT>=128    TOBRAMYCIN Value in next row Sensitive      SENSITIVE2    * HEAVY GROWTH PSEUDOMONAS AERUGINOSA    Coagulation Studies:  Recent Labs  11/21/15 2012   LABPROT 18.8*  INR 1.57    Urinalysis: No results for input(s): COLORURINE, LABSPEC, PHURINE, GLUCOSEU, HGBUR, BILIRUBINUR, KETONESUR, PROTEINUR, UROBILINOGEN, NITRITE, LEUKOCYTESUR in the last 72 hours.  Invalid input(s): APPERANCEUR    Imaging: Dg Abd Portable 1v  11/23/2015  CLINICAL DATA:  Evaluation of impaired orogastric feeding tube. EXAM: PORTABLE ABDOMEN - 1 VIEW COMPARISON:  11/04/2015 FINDINGS: Feeding tube tip is in the antrum of the stomach, essentially unchanged since the prior exam. No visible kink in the visualized portion of the tube. Bowel gas pattern is normal. Scattered areas of intraperitoneal barium appear unchanged. Chronic degenerative changes in the lower lumbar spine. Osteopenia of the pelvis and hips. IMPRESSION:  No acute abnormality. No change in the appearance or position of the visualized portion of the feeding tube. Electronically Signed   By: Francene Boyers M.D.   On: 11/23/2015 11:55     Medications:   . dextrose 25 mL/hr at 11/23/15 2312  . feeding supplement (JEVITY 1.5 CAL/FIBER) Stopped (11/22/15 0200)   . albumin human  12.5 g Intravenous Once  . albumin human  12.5 g Intravenous Once  . albuterol  2.5 mg Nebulization Q6H  . amiodarone  100 mg Per Tube Daily  . antiseptic oral rinse  7 mL Mouth Rinse QID  . aspirin  81 mg Per Tube Daily  . chlorhexidine gluconate  15 mL Mouth Rinse BID  . colistimethate (COLISTIN) IV  250 mg Intravenous Q48H  . epoetin (EPOGEN/PROCRIT) injection  10,000 Units Intravenous Q M,W,F-HD  . escitalopram  10 mg Per Tube Daily  . feeding supplement (PRO-STAT SUGAR FREE 64)  30 mL Per Tube 6 X Daily  . free water  20 mL Per Tube 6 times per day  . heparin subcutaneous  5,000 Units Subcutaneous Q12H  . hydrocortisone  10 mg Per Tube BID  . lidocaine  1 patch Transdermal Q24H  . midodrine  10 mg Per Tube TID WC  . mirtazapine  7.5 mg Per Tube QHS  . multivitamin  5 mL Per Tube Daily  . pantoprazole sodium  40 mg Per  Tube Daily  . sodium chloride  10-40 mL Intracatheter Q12H  . sodium hypochlorite   Irrigation BID  . tigecycline (TYGACIL) IVPB  50 mg Intravenous Q12H  . tobramycin (PF)  300 mg Nebulization BID   acetaminophen (TYLENOL) oral liquid 160 mg/5 mL, albuterol, HYDROmorphone (DILAUDID) injection, loperamide, ondansetron (ZOFRAN) IV, oxyCODONE, polyvinyl alcohol, sodium chloride, zinc oxide  Assessment/ Plan:  50 y.o. 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 Admitted on 08/17/2015  Echo: 11/07/19: EF of 30% to 35%.   1. End-stage renal disease on hemodialysis on HD MWF. The patient has been on dialysis since October of 2014. R IJ permcath.  - Dialysis treatment with IV albumin for oncotic support.  - Midodrine and hydrocortisone ordered for blood pressure support.  - Continue MWF schedule, monitor daily for dailysis treatment. Next dialysis for tomorrow.    2. Anemia of CKD. Hemoglobin 9.8. Multiple transfusions during admission most recently on 1/1 - Epogen and periodic blood transfusions. Not a candidate for IV iron.   3. Acute resp failure: ventilator dependent -prolonged course on ventilator, fio2 currently 40% with PEEP of 5.  - Appreciate pulmonary input. ENT recently evaluated tracheostomy  4. Sepsis: pseudomonas in tracheal aspirate - tobramycin inhaled and tigecycline.   5. Secondary Hyperparathyroidism: PTH  50, phos 5.2 - not currently on a binder or vitamin D agent.     LOS: 104 Courtney [name] 1/5/20179:26 AM

## 2015-11-24 NOTE — Progress Notes (Signed)
   11/24/15 1400  Clinical Encounter Type  Visited With Patient and family together  Visit Type Follow-up  Consult/Referral To Chaplain  Spiritual Encounters  Spiritual Needs Other (Comment)  Stress Factors  Patient Stress Factors Not reviewed  Family Stress Factors Not reviewed  Chaplain rounded in the unit and passed by the patient's room. Saw that she had two visitors and acknowledged the patient with a wave and a smile. The patient looked over at me and waved and smiled. She appeared appreciative of her time with friends/family. Chaplain Zriyah Kopplin A. Valda Christenson Ext. [phone]

## 2015-11-24 NOTE — Progress Notes (Signed)
PULMONARY / CRITICAL CARE MEDICINE   Name: Courtney [name] MRN: [id number] DOB: Sep 06, 1966    ADMISSION DATE:  07/23/2015  BRIEF HISTORY: 49 AAF who has been in medical facilities (hosp, LTAC, rehab) for 2 yrs following gastric bypass surgery with multiple complications. Now with chronic trach, ESRD, profound debilitation, severe sacral pressure ulcer. Was seemingly making progress and transferred to rehab facility approx one week prior to this admission. She was sent to [facility] ED with AMS and hypotension. Working dx of severe sepsis/septic shock due to infected sacral pressure ulcer. Since admission. Her course is been very complicated with numerous complications including septic shock and GI bleeding. Now with failure to wean from vent and possible MI event, repeat ECHO EF 30-35, possible old MI, severe hypokinesis of the nnterior and anteroseptal myocardium   SUBJ: RASS 0. Reasonably comfortable. Communicating appropriately with RN this AM. Hypothermic this AM - Bair Hugger ordered  Summary of MAJOR EVENTS/TEST RESULTS: Admission 02/07/14-05/07/14 Admission 07/21/14-09/06/14 Discharged to [facility]. Pt had palliative consult at that time, were asked to sign off by husband.  09/23 CT head: NAD 09/23 EEG: no epileptiform activity 09/23 PRBCs for Hgb 6.4 09/24 bedside debridement of sacral wound. Abscess drained 09/25 Off vasopressors. More alert. No distress. Worsening thrombocytopenia. Vanc DC'd 09/29 Dr. Sampson Goon (I.D) excused from the case by patient's husband. 10/02 MRI -multiple infarcts 10/03 tracheal bleeding- transfused platelets 10/03 hospitalist service excused from the case by patient's husband 10/03 Echocardiogram ejection fraction was 55-60%, pulmonary systolic pressure was 39 mmHg 13/08 restart TF's at lower rate, attempt reg HD  10/12 Transferred to med-surg floor. Remains on PCCM service 10/14 SLP eval: pt unable to tolerate PMV adequately 10/17-will re-attempt PM  valve-discussed with Speech therapist 10/18 passed swallow eval-start pureed thick foods no thin liquids-continue NG feeds 10/19 transferred to step down for sepsis/aspiration pneumonia 10/19 cxr shows RLL opacity 10/21 sacral decub debride at bedside by surgery 10/22 started back on vasopressors while on HD 10/27 CT with osteo, R hip fx, unable to identify tip of dubhoff tube - sent for fluoro study 10/28 Ortho consultation: I do not feel that she is a surgical candidate. Therefore, I feel that it would best to manage this fracture nonsurgically and allow it to heal by itself over time, which it should.  10/28 Gen Surg consultation: Due to the lack of free air or free flow of contrast and the peritoneum there is no indication for any surgical intervention on this. Would recommend pulling back the feeding tube 1-2 cm. Would recheck an abdominal film to confirm no pneumoperitoneum in the morning. Absence of any changes okay to continue using Dobbhoff for feeding and medications. 10/28 gastrograffin study: The study confirms that the feeding tube pes perforated through the duodenum and the tip is within a cavity that fills with injected contrast. The cavity does appear walled off 11/4 refuses oral feeds, continue TF's CT reviewed: T8-T9 discitis/osteomyelitis, RT HIP FRACTURE 11/5 placed back on vasopressors, placed back on Vent due to resp acidosis, levophed turned off 11/6 afternoon 11/5 PM Trach changed out due to cuff leak, #6 Shiley cuffed, 11/6 dubhoff occluded, removed and replaced, new dubhoff shows tube in the antral stomach 11/15 refusing to take oral feeds 11/18 placed back on Vent for increased WOB,SOB. 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 10/18/2015; the patient developed a new left basal pneumonia; with recurrent sepsis.  10/19/2015; fevers resolved  with IV acetaminophen. Tube feeds changed from vital  to Jevity.  12/7 CXR with stable b/l opacities.  12/12 elevated K s/p HD-remains on AC mode 12/16-vomiting-hold feeds and re-assess in next 24 hrs 12/17- TF restarted at 1/2 goal (goal = 50cc/per) 12/18- elevated wbc, fever (101.4), recultured, restarted on ceftaz 12/19 Pericardial friction rub. Echocardiogram: LVEF 30-35%, anteroseptal hypokinesis or akinesis c/w anterior wall MI 12/20 Dr Sung AmabileSimonds discussed new echocardiogram findings with pt's husband and explained that this likely represents an anterior wall MI sometime in the previous couple of weeks. It was explained that she is not a candidate for any cardiology intervention. Code status was again discussed. Husband expressed understanding that she was not going to survive this illness in any favorable way but continues to request that she be full code status based on what he believes to be her wishes 12/21 vomitied 50cc of TF - AM TF held, restart TF at 3pm @30cc  12/26 Persistent intermittent trach cuff leak. Distal XLT trach requested.  12/27 15 beat NSVT 12/28 patient refused HD and refused Bath time 12/29 D10 infusion for hypoglycemia, TF's on hold due to intolerance 12/30 CODE BLUE  VTACH 01/01 transfuse 2 units of blood 01/02 increased respiratory distress, copious purulent secretions 01/02 LE venous US: No DVT 01/03 Repeat TTE: The cavity size was mildly dilated. Systolic function was moderately to severely reduced. The estimated ejection fraction was in the range of 30% to 35%. Diffuse hypokinesis. Moderate to severe hypokinesis of the anteroseptal, anterior, and apical myocardium. Hypokinesis of the anterior myocardium 01/05 Hypothermic - Bair Hugger ordered  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 History of  sepsis from C. glabrata MRSA PCR 9/23 >> NEG Wound (swab) 9/23 >> multiple organisms Wound (debridement) 9/24 >> Enterococcus, K. Pneumoniae, P. Mirabilis, VRE Wound 9/26 >> No growth Blood 9/23 >> NEG CDiff 9/27>>neg Stool Cx 10/15>> negative Cdiff 10/25>>neg Trach Aspirate 10/28>> light growth pseudomonas Blood 10/28 >> 1/2 GPC >>  Sputum cultures obtained 09/22/15 due to mucus plugging>>Pseudomonas BONE TISSUE Cx 11/3>>E.coli (ESBL), k. Pneumonia (daptomycin and septra) Bld Cx 11/27>>negative thus far.  Trach Asp 11/27>> grew out Pseudomonas, and Klebsiella pneumonia. Trach Asp 12/18>> MDR Klebsiella, Pseudomonas Bld Cx 12/18>> NEG 12/26 resp culture >> MDR pseudomonas  ANTIMICROBIALS:  Aztreonam 9/23 >> 9/24 Vanc 9/23 >> 9/25 Vanc 9/26>>9/27 Daptomycin 9/27>> 10/12, 10/28>> off Meropenem 9/23 >> 10/14, 10/28 >> 10/31 Zosyn 11/27,>> 11/30. Levaquin 11/29>> 11/29. Ceftaz 12/1>>12/11, 12/18>> 12/24 Colisitin 12/26 >>  Tigecycline 12/26 >>  Nebulized tobramycin 12/30 >>  VITAL SIGNS: Temp:  [93.1 F (33.9 C)-97.8 F (36.6 C)] 94 F (34.4 C) (01/05 1100) Pulse Rate:  [34-72] 66 (01/05 1200) Resp:  [13-27] 19 (01/05 1200) BP: (107-130)/(33-116) 127/60 mmHg (01/05 1200) SpO2:  [90 %-100 %] 99 % (01/05 1200) FiO2 (%):  [40 %] 40 % (01/05 1124) HEMODYNAMICS:   VENTILATOR SETTINGS: Vent Mode:  [-] PRVC FiO2 (%):  [40 %] 40 % Set Rate:  [14 bmp] 14 bmp Vt Set:  [500 mL-530 mL] 500 mL PEEP:  [5 cmH20-8 cmH20] 5 cmH20 Plateau Pressure:  [23 cmH20] 23 cmH20 INTAKE / OUTPUT:  Intake/Output Summary (Last 24 hours) at 11/24/15 1405 Last data filed at 11/24/15 1206  Gross per 24 hour  Intake    910 ml  Output  -  1500 ml  Net   2410 ml    Review of Systems  Unable to perform ROS  Physical Exam  RASS 0, intermittent trach tube cuff leak, intermittent dyssynchrony with vent HEENT; Cushingoid facies, temporal wasting, NGT present Trach site clean Diffuse  rhonchi Reg with occ extrasystoles, no M Abdomen soft, + BS Ext cool, trace symmetric edema, marked muscle wasting Severe sacral decubitus ulcer-   LABS:  CBC  Recent Labs Lab 11/21/15 2012 11/22/15 0925 11/23/15 1339  WBC 24.0* 18.1* 10.6  HGB 10.1* 9.6* 9.8*  HCT 32.1* 30.7* 31.7*  PLT 170 153 119*   Coag's  Recent Labs Lab 11/21/15 2012  APTT 41*  INR 1.57   BMET  Recent Labs Lab 11/21/15 0147 11/21/15 0434 11/21/15 1318 11/23/15 1339  NA 133*  --  131* 131*  K 3.6  --  3.6 3.8  CL 97*  --  96* 96*  CO2 28  --  28 29  BUN 67*  --  72* 72*  CREATININE 1.25* 1.42* 1.46* 1.45*  GLUCOSE 133*  --  117* 101*   Electrolytes  Recent Labs Lab 11/18/15 0830  11/19/15 0630 11/21/15 0147 11/21/15 1318 11/23/15 1339  CALCIUM 8.2*  < > 8.1* 8.0* 8.0* 8.1*  MG 1.7  < > 2.1 2.1 2.1  --   PHOS 4.1  --   --   --   --  5.2*  < > = values in this interval not displayed. Sepsis Markers No results for input(s): LATICACIDVEN, PROCALCITON, O2SATVEN in the last 168 hours. ABG  Recent Labs Lab 11/21/15 1745  PHART 7.41  PCO2ART 47  PO2ART 78*   Liver Enzymes  Recent Labs Lab 11/18/15 0830 11/23/15 1339  AST 17  --   ALT 13*  --   ALKPHOS 1309*  --   BILITOT 0.7  --   ALBUMIN 1.6* 1.5*   Cardiac Enzymes  Recent Labs Lab 11/21/15 2012 11/22/15 0202 11/22/15 0925  TROPONINI 0.05* 0.16* 0.06*   Glucose  Recent Labs Lab 11/23/15 1601 11/23/15 1956 11/23/15 2315 11/24/15 0437 11/24/15 0730 11/24/15 1126  GLUCAP 71 77 87 92 90 85    Imaging No results found.  ASSESSMENT / PLAN: IMPRESSION: Very prolonged and complicated hospitalization after gastric bypass surgery 2014 Never returned home ESRD Recurrent severe sepsis - PAN resistant Klebisella in sputum Recurrent PNA-now with pseudomonas H/O C diff H/O VTE DM2 - controlled Episodic hypoglycemia Chronic steroid therapy, for a history of of adrenal  insufficiency Incidental finding of R hip fx - conservative mgmt. 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 Husband declined TEE Profound deconditioning Chronic pain New finding of pericardial friction rub 12/19 - resolved New finding of cardiomyopathy, likely post MI 12/19  PAF/flutter Sinus bradycardia HCAP with MDR Donalee Citrin and pseudomonas Chronic anemia Severe protein - calorie malnutrition TF intolerance  Cont vent support - settings reviewed and/or adjusted Cont vent bundle Cont HD per renal Monitor BMET intermittently Monitor I/Os Correct electrolytes as indicated DVT px: SQ heparin Monitor CBC intermittently Transfuse per usual guidelines Resume TF's at low rate Monitor temp, WBC count Micro and abx as above ID following - appreciate recs Cont Amiodarone - 100 mg per tube daily DC metoprolol Cont opiates PRN Cont D10 Infusion for hypoglycemia Resume TFs @ 10 cc/hr  Prognosis remains very poor without hope for functional recovery.   The above time includes time spent in consultation with patient and/or family members and reviewing care plan on multidisciplinary rounds  Billy Fischer, MD PCCM service Mobile [phone] Pager [phone]

## 2015-11-24 NOTE — Progress Notes (Signed)
Pt awake at times and follows simple commands, nods head to questions.  NSR with pvc's, lungs coarse.  Tube feedings to be started, awaiting Vital formula to arrive from dining services.  Remains on D10 drip for blood sugar control.  Anuric (pt baseline).  Pt was hypothermic this am (93), remains on warming blanket, temperature bact at 98.1.  Pt bathed this shift, sacrum dressing changed and trach care performed this shift.

## 2015-11-24 NOTE — Progress Notes (Signed)
Nutrition Follow-up  DOCUMENTATION CODES:   Severe malnutrition in context of acute illness/injury  INTERVENTION:   EN: discussed nutritional poc during ICU rounds; MD Simonds agreeable to starting TF back at rate of 10 ml/hr, also discussed changing formula back to an elemental formula as this may be better tolerated by pt. MD agreeable to this as well. Recommend changing TF to Vital 1.2 AF at rate of 10 ml/hr, goal rate of 60 ml/hr; continue Prostat as ordered. Per discussion during ICU rounds, MD also plans to start erythromycin for delayed gastric empyting   NUTRITION DIAGNOSIS:   Inadequate oral intake related to wound healing, acute illness, chronic illness as evidenced by NPO status, estimated needs.   GOAL:   Patient will meet greater than or equal to 90% of their needs  MONITOR:    (Energy Intake, Anthropometrics, Digestive System, Electrolyte/Renal Profile, Glucose Profile)  REASON FOR ASSESSMENT:   Consult Enteral/tube feeding initiation and management  ASSESSMENT:    Pt remains on vent via trach, HD as scheduled. Pt hypothermic this AM, warming blanket applied. TF on hold for >1 week, continues on D10 infusion to maintain FSBS  EN: TF remains on hold, active order for Jevity 1.5 at 10 ml/hr remains  Digestive System: abdominal xray today confirmed placement of dobhoff tube (located in antrum), no other acute abnormalities. Pt continues with watery brown/yellow stool despite being off TF, noted imodium changed back to prn, probiotic has been discontinued again  Skin:   (stage IV sacrum, stage III hip, unstageable foot; rectal ulcer from fleixseal, ulcer in nose from dobhoff)  Electrolyte and Renal Profile:  Recent Labs Lab 11/18/15 0830  11/19/15 0630 11/21/15 0147 11/21/15 0434 11/21/15 1318 11/23/15 1339  BUN 58*  < > 45* 67*  --  72* 72*  CREATININE 1.29*  < > 1.06* 1.25* 1.42* 1.46* 1.45*  NA 139  < > 136 133*  --  131* 131*  K 3.7  < > 3.5 3.6  --   3.6 3.8  MG 1.7  < > 2.1 2.1  --  2.1  --   PHOS 4.1  --   --   --   --   --  5.2*  < > = values in this interval not displayed. Glucose Profile:   Recent Labs  11/23/15 2315 11/24/15 0437 11/24/15 0730  GLUCAP 87 92 90   Meds:  . albumin human  12.5 g Intravenous Once  . albumin human  12.5 g Intravenous Once  . albuterol  2.5 mg Nebulization Q6H  . amiodarone  100 mg Per Tube Daily  . antiseptic oral rinse  7 mL Mouth Rinse QID  . aspirin  81 mg Per Tube Daily  . chlorhexidine gluconate  15 mL Mouth Rinse BID  . colistimethate (COLISTIN) IV  250 mg Intravenous Q48H  . epoetin (EPOGEN/PROCRIT) injection  10,000 Units Intravenous Q M,W,F-HD  . erythromycin  200 mg Intravenous 3 times per day  . escitalopram  10 mg Per Tube Daily  . feeding supplement (PRO-STAT SUGAR FREE 64)  30 mL Per Tube 6 X Daily  . free water  20 mL Per Tube 6 times per day  . heparin subcutaneous  5,000 Units Subcutaneous Q12H  . hydrocortisone  10 mg Per Tube BID  . lidocaine  1 patch Transdermal Q24H  . midodrine  10 mg Per Tube TID WC  . mirtazapine  7.5 mg Per Tube QHS  . multivitamin  5 mL Per Tube Daily  .  pantoprazole sodium  40 mg Per Tube Daily  . sodium chloride  10-40 mL Intracatheter Q12H  . sodium hypochlorite   Irrigation BID  . tigecycline (TYGACIL) IVPB  50 mg Intravenous Q12H  . tobramycin (PF)  300 mg Nebulization BID   Height:   Ht Readings from Last 1 Encounters:  01/25/12 5\' 9"  (1.753 m)    Weight:   Wt Readings from Last 1 Encounters:  11/22/15 224 lb 13.9 oz (102 kg)   Filed Weights   11/21/15 0500 11/21/15 1540 11/22/15 0500  Weight: 235 lb 14.3 oz (107 kg) 235 lb 14.3 oz (107 kg) 224 lb 13.9 oz (102 kg)    BMI:  Body mass index is 33.19 kg/(m^2).  Estimated Nutritional Needs:   Kcal:  [id number] kcals (30-35 kcals/kg) using IBW   Protein:  (1.5-2.0 g/kg)145-194 g/d but likely closer to 2.0-2.5 g/kg Using current wt of 97kg  Fluid:  1000ml + UOP  EDUCATION  NEEDS:   No education needs identified at this time  LOW Care Level  Romelle Starcherate Giovanna Kemmerer MS, RD, LDN [phone] Pager  [phone] Weekend/On-Call Pager

## 2015-11-24 NOTE — Progress Notes (Signed)
ANTIBIOTIC CONSULT NOTE - follow Up  Pharmacy Consult for Colistin and Tigecycline  Indication: MDR Respiratory Infection   Allergies  Allergen Reactions  . Contrast Media [Iodinated Diagnostic Agents] Anaphylaxis  . Ampicillin Rash   Patient Measurements: Height:  (SCALE BROKE) Weight: 224 lb 13.9 oz (102 kg) IBW/kg (Calculated) : 66.2  Vital Signs: Temp: 98.1 F (36.7 C) (01/05 1600) Temp Source: Axillary (01/05 1600) BP: 107/58 mmHg (01/05 1600) Pulse Rate: 72 (01/05 1600) Intake/Output from previous day: 01/04 0701 - 01/05 0700 In: 1055 [I.V.:575; NG/GT:380; IV Piggyback:100] Out: -1500  Intake/Output from this shift: Total I/O In: 190 [I.V.:150; NG/GT:40] Out: -   Recent Labs  11/21/15 2012 11/22/15 0925 11/23/15 1339  WBC 24.0* 18.1* 10.6  HGB 10.1* 9.6* 9.8*  PLT 170 153 119*  CREATININE  --   --  1.45*   Estimated Creatinine Clearance: 59 mL/min (by C-G formula based on Cr of 1.45). No results for input(s): VANCOTROUGH, VANCOPEAK, VANCORANDOM, GENTTROUGH, GENTPEAK, GENTRANDOM, TOBRATROUGH, TOBRAPEAK, TOBRARND, AMIKACINPEAK, AMIKACINTROU, AMIKACIN in the last 72 hours.   Microbiology: Recent Results (from the past 720 hour(s))  Culture, blood (Routine X 2) w Reflex to ID Panel     Status: None   Collection Time: 11/06/15  8:27 AM  Result Value Ref Range Status   Specimen Description BLOOD RIGHT HAND  Final   Special Requests BOTTLES DRAWN AEROBIC AND ANAEROBIC 3CC  Final   Culture NO GROWTH 5 DAYS  Final   Report Status 11/11/2015 FINAL  Final  Culture, blood (Routine X 2) w Reflex to ID Panel     Status: None   Collection Time: 11/06/15  8:29 AM  Result Value Ref Range Status   Specimen Description BLOOD LEFT HAND  Final   Special Requests BOTTLES DRAWN AEROBIC AND ANAEROBIC  1CC  Final   Culture NO GROWTH 5 DAYS  Final   Report Status 11/11/2015 FINAL  Final  Culture, expectorated sputum-assessment     Status: None   Collection Time: 11/06/15  8:41  AM  Result Value Ref Range Status   Specimen Description EXPECTORATED SPUTUM  Final   Special Requests Immunocompromised  Final   Sputum evaluation THIS SPECIMEN IS ACCEPTABLE FOR SPUTUM CULTURE  Final   Report Status 11/06/2015 FINAL  Final  Culture, respiratory (NON-Expectorated)     Status: None   Collection Time: 11/06/15  8:41 AM  Result Value Ref Range Status   Specimen Description EXPECTORATED SPUTUM  Final   Special Requests Immunocompromised Reflexed from [id number]  Final   Gram Stain   Final    MANY WBC SEEN MANY GRAM NEGATIVE RODS EXCELLENT SPECIMEN - 90-100% WBCS    Culture   Final    MODERATE GROWTH KLEBSIELLA PNEUMONIAE MODERATE GROWTH PSEUDOMONAS AERUGINOSA CRITICAL RESULT CALLED TO, READ BACK BY AND VERIFIED WITH: DR. FITZGERALD AT 1120 11/09/15 DV DR. FITZGERALD REQUESTED ORGANISM BE SENT OUT FOR TIGECYCLINE CEFT/AVIBACTAM AND COLISTIN Sent to [facility] for further susceptibility testing. 11/11/15 CTJ    Report Status 11/15/2015 FINAL  Final   Organism ID, Bacteria KLEBSIELLA PNEUMONIAE  Final   Organism ID, Bacteria PSEUDOMONAS AERUGINOSA  Final      Susceptibility   Klebsiella pneumoniae - MIC*    AMPICILLIN >=32 RESISTANT Resistant     CEFAZOLIN >=64 RESISTANT Resistant     CEFTRIAXONE 32 RESISTANT Resistant     CIPROFLOXACIN >=4 RESISTANT Resistant     GENTAMICIN >=16 RESISTANT Resistant     IMIPENEM >=16 RESISTANT Resistant  NITROFURANTOIN 256 RESISTANT Resistant     TRIMETH/SULFA >=320 RESISTANT Resistant     Extended ESBL POSITIVE Resistant     * MODERATE GROWTH KLEBSIELLA PNEUMONIAE   Pseudomonas aeruginosa - MIC*    CEFTAZIDIME >=64 RESISTANT Resistant     CIPROFLOXACIN 2 INTERMEDIATE Intermediate     GENTAMICIN >=16 RESISTANT Resistant     IMIPENEM >=16 RESISTANT Resistant     * MODERATE GROWTH PSEUDOMONAS AERUGINOSA  Culture, respiratory (NON-Expectorated)     Status: None   Collection Time: 11/14/15  2:07 PM  Result Value Ref Range Status    Specimen Description TRACHEAL ASPIRATE  Final   Special Requests NONE  Final   Gram Stain   Final    EXCELLENT SPECIMEN - 90-100% WBCS MANY WBC SEEN MODERATE GRAM NEGATIVE RODS    Culture   Final    HEAVY GROWTH PSEUDOMONAS AERUGINOSA CRITICAL VALUE NOTED.  VALUE IS CONSISTENT WITH PREVIOUSLY REPORTED AND CALLED VALUE.    Report Status 11/16/2015 FINAL  Final   Organism ID, Bacteria PSEUDOMONAS AERUGINOSA  Final      Susceptibility   Pseudomonas aeruginosa - MIC*    CEFTAZIDIME >=64 RESISTANT Resistant     CIPROFLOXACIN 2 INTERMEDIATE Intermediate     GENTAMICIN >=16 RESISTANT Resistant     IMIPENEM >=16 RESISTANT Resistant     PIP/TAZO Value in next row Resistant      RESISTANT>=128    TOBRAMYCIN Value in next row Sensitive      SENSITIVE2    * HEAVY GROWTH PSEUDOMONAS AERUGINOSA    Medical History: Past Medical History  Diagnosis Date  . Obesity   . Dyslipidemia   . Hypertension   . Coronary artery disease     s/p BMS 2010 LAD  . Dysrhythmia     ventricular tachycardia resolved after LAD stent and beta blocker  . Diabetes mellitus     with retinopathy, neuropathy and microalbuminemia  . 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 Patient is on day 105 of hospital stay, and has been treated for multiple infections during this time.   11/06/15: Respiratory cultures showing Pan-resistant  Klebsiella pneumoniae and Pseudomonas aeruginosa  Sputum has been sent to [facility] for susceptibility testing per Dr. Blane Ohara request.   Patient has increased respiratory secretions, leukocytosis and low grade fever   12/30: Alk phos elevated at 1309  Plan:  Per microbiology, Klebsiella is sensitive to Tygacil. Sensitivity results for Avycaz and colistin still  pending.   Continue tigecycline '50mg'$  IV q12h.   Continue  colistimethate '250mg'$  (~2.5  mg/kg) IV q48 hours.   Patient should be monitored for nephrotoxicity and neurotoxicity.  Pharmacy to continue to follow per consult.      Currie Paris, PharmD Clinical Pharmacist   11/24/2015 4:38 PM

## 2015-11-25 LAB — RENAL FUNCTION PANEL
ALBUMIN: 1.5 g/dL — AB (ref 3.5–5.0)
Anion gap: 9 (ref 5–15)
BUN: 72 mg/dL — AB (ref 6–20)
CALCIUM: 7.8 mg/dL — AB (ref 8.9–10.3)
CO2: 27 mmol/L (ref 22–32)
Chloride: 97 mmol/L — ABNORMAL LOW (ref 101–111)
Creatinine, Ser: 1.13 mg/dL — ABNORMAL HIGH (ref 0.44–1.00)
GFR calc Af Amer: >60 mL/min (ref >60)
GFR, EST NON AFRICAN AMERICAN: 56 mL/min — AB (ref >60)
GLUCOSE: 78 mg/dL (ref 65–99)
PHOSPHORUS: 4.9 mg/dL — AB (ref 2.5–4.6)
POTASSIUM: 3.5 mmol/L (ref 3.5–5.1)
SODIUM: 133 mmol/L — AB (ref 135–145)

## 2015-11-25 LAB — CBC
HEMATOCRIT: 29.2 % — AB (ref 35.0–47.0)
Hemoglobin: 9 g/dL — ABNORMAL LOW (ref 12.0–16.0)
MCH: 28.1 pg (ref 26.0–34.0)
MCHC: 30.6 g/dL — AB (ref 32.0–36.0)
MCV: 91.8 fL (ref 80.0–100.0)
PLATELETS: 99 10*3/uL — AB (ref 150–440)
RBC: 3.19 MIL/uL — ABNORMAL LOW (ref 3.80–5.20)
RDW: 19.5 % — AB (ref 11.5–14.5)
WBC: 12.8 10*3/uL — AB (ref 3.6–11.0)

## 2015-11-25 LAB — GLUCOSE, CAPILLARY
GLUCOSE-CAPILLARY: 107 mg/dL — AB (ref 65–99)
GLUCOSE-CAPILLARY: 77 mg/dL (ref 65–99)
GLUCOSE-CAPILLARY: 90 mg/dL (ref 65–99)
Glucose-Capillary: 89 mg/dL (ref 65–99)

## 2015-11-25 MED ORDER — ALBUMIN HUMAN 25 % IV SOLN
12.5000 g | Freq: Once | INTRAVENOUS | Status: AC
  Administered 2015-11-25: 12.5 g via INTRAVENOUS
  Filled 2015-11-25: qty 50

## 2015-11-25 NOTE — Progress Notes (Signed)
MEDICATION RELATED CONSULT NOTE - Follow up   Pharmacy Consult for Renal dosing  Allergies  Allergen Reactions  . Contrast Media [Iodinated Diagnostic Agents] Anaphylaxis  . Ampicillin Rash   Patient Measurements: Ht 195ft 9in Height:  (SCALE BROKE) Weight: 224 lb 13.9 oz (102 kg) IBW/kg (Calculated) : 66.2  Vital Signs: Temp: 97.5 F (36.4 C) (01/06 1134) Temp Source: Axillary (01/06 1134) BP: 102/57 mmHg (01/06 0900) Pulse Rate: 79 (01/06 0900)  Recent Labs  11/23/15 1339 11/25/15 0938  WBC 10.6 12.8*  HGB 9.8* 9.0*  HCT 31.7* 29.2*  PLT 119* 99*  CREATININE 1.45* 1.13*  PHOS 5.2* 4.9*  ALBUMIN 1.5* 1.5*   Estimated Creatinine Clearance: 75.7 mL/min (by C-G formula based on Cr of 1.13).  Assessment: 50 yo patient on HD. Pharmacy consulted for renal dosing of medications.   Plan:  No medications require adjustment at present. Pharmacy will continue to monitor and dose adjust as needed.     Charlies SilversMichael Simpson, PharmD   11/25/2015

## 2015-11-25 NOTE — Progress Notes (Signed)
ANTIBIOTIC CONSULT NOTE - follow Up  Pharmacy Consult for Colistin and Tigecycline  Indication: MDR Respiratory Infection   Allergies  Allergen Reactions  . Contrast Media [Iodinated Diagnostic Agents] Anaphylaxis  . Ampicillin Rash   Patient Measurements: Height:  (SCALE BROKE) Weight: 224 lb 13.9 oz (102 kg) IBW/kg (Calculated) : 66.2  Vital Signs: Temp: 97.5 F (36.4 C) (01/06 1134) Temp Source: Axillary (01/06 1134) BP: 102/57 mmHg (01/06 0900) Pulse Rate: 79 (01/06 0900) Intake/Output from previous day: 01/05 0701 - 01/06 0700 In: 1440 [I.V.:622.5; NG/GT:217.5; IV Piggyback:600] Out: -  Intake/Output from this shift: Total I/O In: 90 [I.V.:50; NG/GT:40] Out: -2000   Recent Labs  11/23/15 1339 11/25/15 0938  WBC 10.6 12.8*  HGB 9.8* 9.0*  PLT 119* 99*  CREATININE 1.45* 1.13*   Estimated Creatinine Clearance: 75.7 mL/min (by C-G formula based on Cr of 1.13). No results for input(s): VANCOTROUGH, VANCOPEAK, VANCORANDOM, GENTTROUGH, GENTPEAK, GENTRANDOM, TOBRATROUGH, TOBRAPEAK, TOBRARND, AMIKACINPEAK, AMIKACINTROU, AMIKACIN in the last 72 hours.   Microbiology: Recent Results (from the past 720 hour(s))  Culture, blood (Routine X 2) w Reflex to ID Panel     Status: None   Collection Time: 11/06/15  8:27 AM  Result Value Ref Range Status   Specimen Description BLOOD RIGHT HAND  Final   Special Requests BOTTLES DRAWN AEROBIC AND ANAEROBIC 3CC  Final   Culture NO GROWTH 5 DAYS  Final   Report Status 11/11/2015 FINAL  Final  Culture, blood (Routine X 2) w Reflex to ID Panel     Status: None   Collection Time: 11/06/15  8:29 AM  Result Value Ref Range Status   Specimen Description BLOOD LEFT HAND  Final   Special Requests BOTTLES DRAWN AEROBIC AND ANAEROBIC  1CC  Final   Culture NO GROWTH 5 DAYS  Final   Report Status 11/11/2015 FINAL  Final  Culture, expectorated sputum-assessment     Status: None   Collection Time: 11/06/15  8:41 AM  Result Value Ref Range  Status   Specimen Description EXPECTORATED SPUTUM  Final   Special Requests Immunocompromised  Final   Sputum evaluation THIS SPECIMEN IS ACCEPTABLE FOR SPUTUM CULTURE  Final   Report Status 11/06/2015 FINAL  Final  Culture, respiratory (NON-Expectorated)     Status: None   Collection Time: 11/06/15  8:41 AM  Result Value Ref Range Status   Specimen Description EXPECTORATED SPUTUM  Final   Special Requests Immunocompromised Reflexed from [id number]  Final   Gram Stain   Final    MANY WBC SEEN MANY GRAM NEGATIVE RODS EXCELLENT SPECIMEN - 90-100% WBCS    Culture   Final    MODERATE GROWTH KLEBSIELLA PNEUMONIAE MODERATE GROWTH PSEUDOMONAS AERUGINOSA CRITICAL RESULT CALLED TO, READ BACK BY AND VERIFIED WITH: DR. FITZGERALD AT 1120 11/09/15 DV DR. FITZGERALD REQUESTED ORGANISM BE SENT OUT FOR TIGECYCLINE CEFT/AVIBACTAM AND COLISTIN Sent to [facility] for further susceptibility testing. 11/11/15 CTJ    Report Status 11/15/2015 FINAL  Final   Organism ID, Bacteria KLEBSIELLA PNEUMONIAE  Final   Organism ID, Bacteria PSEUDOMONAS AERUGINOSA  Final      Susceptibility   Klebsiella pneumoniae - MIC*    AMPICILLIN >=32 RESISTANT Resistant     CEFAZOLIN >=64 RESISTANT Resistant     CEFTRIAXONE 32 RESISTANT Resistant     CIPROFLOXACIN >=4 RESISTANT Resistant     GENTAMICIN >=16 RESISTANT Resistant     IMIPENEM >=16 RESISTANT Resistant     NITROFURANTOIN 256 RESISTANT Resistant     TRIMETH/SULFA >=320  RESISTANT Resistant     Extended ESBL POSITIVE Resistant     * MODERATE GROWTH KLEBSIELLA PNEUMONIAE   Pseudomonas aeruginosa - MIC*    CEFTAZIDIME >=64 RESISTANT Resistant     CIPROFLOXACIN 2 INTERMEDIATE Intermediate     GENTAMICIN >=16 RESISTANT Resistant     IMIPENEM >=16 RESISTANT Resistant     * MODERATE GROWTH PSEUDOMONAS AERUGINOSA  Culture, respiratory (NON-Expectorated)     Status: None   Collection Time: 11/14/15  2:07 PM  Result Value Ref Range Status   Specimen Description TRACHEAL  ASPIRATE  Final   Special Requests NONE  Final   Gram Stain   Final    EXCELLENT SPECIMEN - 90-100% WBCS MANY WBC SEEN MODERATE GRAM NEGATIVE RODS    Culture   Final    HEAVY GROWTH PSEUDOMONAS AERUGINOSA CRITICAL VALUE NOTED.  VALUE IS CONSISTENT WITH PREVIOUSLY REPORTED AND CALLED VALUE.    Report Status 11/16/2015 FINAL  Final   Organism ID, Bacteria PSEUDOMONAS AERUGINOSA  Final      Susceptibility   Pseudomonas aeruginosa - MIC*    CEFTAZIDIME >=64 RESISTANT Resistant     CIPROFLOXACIN 2 INTERMEDIATE Intermediate     GENTAMICIN >=16 RESISTANT Resistant     IMIPENEM >=16 RESISTANT Resistant     PIP/TAZO Value in next row Resistant      RESISTANT>=128    TOBRAMYCIN Value in next row Sensitive      SENSITIVE2    * HEAVY GROWTH PSEUDOMONAS AERUGINOSA    Medical History: Past Medical History  Diagnosis Date  . Obesity   . Dyslipidemia   . Hypertension   . Coronary artery disease     s/p BMS 2010 LAD  . Dysrhythmia     ventricular tachycardia resolved after LAD stent and beta blocker  . Diabetes mellitus     with retinopathy, neuropathy and microalbuminemia  . 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 Patient is on day 105 of hospital stay, and has been treated for multiple infections during this time.   11/06/15: Respiratory cultures showing Pan-resistant  Klebsiella pneumoniae and Pseudomonas aeruginosa  Sputum has been sent to [facility] for susceptibility testing per Dr. Blane Ohara request.   Patient has increased respiratory secretions, leukocytosis and low grade fever   12/30: Alk phos elevated at 1309  Plan:  Per microbiology, Klebsiella is sensitive to Tygacil. Sensitivity results for Avycaz and colistin are still pending.   Continue tigecycline '50mg'$  IV q12h.   Continue  colistimethate '250mg'$  (~2.5 mg/kg) IV q48 hours.    Patient should be monitored for nephrotoxicity and neurotoxicity.  Pharmacy to continue to follow per consult.      Ulice Dash, PharmD Clinical Pharmacist   11/25/2015 12:59 PM

## 2015-11-25 NOTE — Progress Notes (Signed)
Subjective:   Seen and examined on hemodialysis. Tolerating treatment well.   Objective:  Vital signs in last 24 hours:  Temp:  [93.1 F (33.9 C)-98.4 F (36.9 C)] 97.5 F (36.4 C) (01/06 0745) Pulse Rate:  [62-80] 79 (01/06 0900) Resp:  [15-23] 20 (01/06 0900) BP: (92-129)/(43-84) 102/57 mmHg (01/06 0900) SpO2:  [94 %-100 %] 94 % (01/06 0900) FiO2 (%):  [40 %] 40 % (01/06 0801)  Weight change:  Filed Weights   11/21/15 0500 11/21/15 1540 11/22/15 0500  Weight: 107 kg (235 lb 14.3 oz) 107 kg (235 lb 14.3 oz) 102 kg (224 lb 13.9 oz)    Intake/Output: I/O last 3 completed shifts: In: 1775 [I.V.:897.5; NG/GT:277.5; IV Piggyback:600] Out: -    Intake/Output this shift:  Total I/O In: 90 [I.V.:50; NG/GT:40] Out: -   Physical Exam: General: critically ill appearing  Head/ENT: OM moist, NG tube  Eyes: Anicteric, eyes closed  Neck: Tracheostomy in place  Lungs:  bilatearl rhonchi, vent assisted PRVC fio2 40%  Heart: S1S2 no rubs  Abdomen:  Soft, nontender, BS present  Extremities: 2+ dependent edema, anasarca  Neurologic: Resting comfortably at the moment  Access:  R IJ permcath.       Basic Metabolic Panel:  Recent Labs Lab 11/18/15 1810 11/19/15 0630 11/21/15 0147 11/21/15 0434 11/21/15 1318 11/23/15 1339  NA 137 136 133*  --  131* 131*  K 3.2* 3.5 3.6  --  3.6 3.8  CL 101 100* 97*  --  96* 96*  CO2 30 27 28   --  28 29  GLUCOSE 105* 83 133*  --  117* 101*  BUN 41* 45* 67*  --  72* 72*  CREATININE 0.98 1.06* 1.25* 1.42* 1.46* 1.45*  CALCIUM 8.1* 8.1* 8.0*  --  8.0* 8.1*  MG 1.6* 2.1 2.1  --  2.1  --   PHOS  --   --   --   --   --  5.2*    Liver Function Tests:  Recent Labs Lab 11/23/15 1339  ALBUMIN 1.5*   No results for input(s): LIPASE, AMYLASE in the last 168 hours. No results for input(s): AMMONIA in the last 168 hours.  CBC:  Recent Labs Lab 11/19/15 0630 11/20/15 0948 11/21/15 1026 11/21/15 2012 11/22/15 0925 11/23/15 1339  WBC  20.5* 20.7* 19.9* 24.0* 18.1* 10.6  NEUTROABS 17.2* 18.2* 17.1*  --   --   --   HGB 7.5* 7.3* 9.7* 10.1* 9.6* 9.8*  HCT 24.6* 24.8* 30.8* 32.1* 30.7* 31.7*  MCV 94.5 94.0 91.5 91.2 90.5 91.1  PLT 229 229 186 170 153 119*    Cardiac Enzymes:  Recent Labs Lab 11/19/15 0031 11/19/15 0630 11/21/15 2012 11/22/15 0202 11/22/15 0925  TROPONINI 0.05* 0.13* 0.05* 0.16* 0.06*    BNP: Invalid input(s): POCBNP  CBG:  Recent Labs Lab 11/24/15 0730 11/24/15 1126 11/24/15 1612 11/25/15 0055 11/25/15 0746  GLUCAP 90 85 83 77 89    Microbiology: Results for orders placed or performed during the hospital encounter of 07/22/2015  Blood Culture (routine x 2)     Status: None   Collection Time: 08/18/2015  8:51 AM  Result Value Ref Range Status   Specimen Description BLOOD [name]  Final   Special Requests BOTTLES DRAWN AEROBIC AND ANAEROBIC  3CC  Final   Culture NO GROWTH 5 DAYS  Final   Report Status 08/17/2015 FINAL  Final  Blood Culture (routine x 2)     Status: None   Collection  Time: 08/09/2015  9:20 AM  Result Value Ref Range Status   Specimen Description BLOOD LEFT ARM  Final   Special Requests BOTTLES DRAWN AEROBIC AND ANAEROBIC  1CC  Final   Culture NO GROWTH 5 DAYS  Final   Report Status 08/17/2015 FINAL  Final  Wound culture     Status: None   Collection Time: 07/30/2015  9:20 AM  Result Value Ref Range Status   Specimen Description DECUBITIS  Final   Special Requests Normal  Final   Gram Stain   Final    FEW WBC SEEN MANY GRAM NEGATIVE RODS RARE GRAM POSITIVE COCCI    Culture   Final    HEAVY GROWTH ESCHERICHIA COLI MODERATE GROWTH ENTEROBACTER AEROGENES PROTEUS MIRABILIS HEAVY GROWTH ENTEROCOCCUS SPECIES VRE HAVE INTRINSIC RESISTANCE TO MOST COMMONLY USED ANTIBIOTICS AND THE ABILITY TO ACQUIRE RESISTANCE TO MOST AVAILABLE ANTIBIOTICS.    Report Status 08/16/2015 FINAL  Final   Organism ID, Bacteria ESCHERICHIA COLI  Final   Organism ID, Bacteria ENTEROBACTER  AEROGENES  Final   Organism ID, Bacteria PROTEUS MIRABILIS  Final   Organism ID, Bacteria ENTEROCOCCUS SPECIES  Final      Susceptibility   Enterobacter aerogenes - MIC*    CEFTAZIDIME <=1 SENSITIVE Sensitive     CEFAZOLIN >=64 RESISTANT Resistant     CEFTRIAXONE <=1 SENSITIVE Sensitive     CIPROFLOXACIN <=0.25 SENSITIVE Sensitive     GENTAMICIN <=1 SENSITIVE Sensitive     IMIPENEM 1 SENSITIVE Sensitive     TRIMETH/SULFA <=20 SENSITIVE Sensitive     * MODERATE GROWTH ENTEROBACTER AEROGENES   Escherichia coli - MIC*    AMPICILLIN <=2 SENSITIVE Sensitive     CEFTAZIDIME <=1 SENSITIVE Sensitive     CEFAZOLIN <=4 SENSITIVE Sensitive     CEFTRIAXONE <=1 SENSITIVE Sensitive     CIPROFLOXACIN <=0.25 SENSITIVE Sensitive     GENTAMICIN <=1 SENSITIVE Sensitive     IMIPENEM <=0.25 SENSITIVE Sensitive     TRIMETH/SULFA <=20 SENSITIVE Sensitive     * HEAVY GROWTH ESCHERICHIA COLI   Proteus mirabilis - MIC*    AMPICILLIN >=32 RESISTANT Resistant     CEFTAZIDIME <=1 SENSITIVE Sensitive     CEFAZOLIN 8 SENSITIVE Sensitive     CEFTRIAXONE <=1 SENSITIVE Sensitive     CIPROFLOXACIN <=0.25 SENSITIVE Sensitive     GENTAMICIN <=1 SENSITIVE Sensitive     IMIPENEM 1 SENSITIVE Sensitive     TRIMETH/SULFA <=20 SENSITIVE Sensitive     * PROTEUS MIRABILIS   Enterococcus species - MIC*    AMPICILLIN >=32 RESISTANT Resistant     VANCOMYCIN >=32 RESISTANT Resistant     GENTAMICIN SYNERGY SENSITIVE Sensitive     TETRACYCLINE Value in next row Resistant      RESISTANT>=16    * HEAVY GROWTH ENTEROCOCCUS SPECIES  MRSA PCR Screening     Status: None   Collection Time: 07/24/2015  2:38 PM  Result Value Ref Range Status   MRSA by PCR NEGATIVE NEGATIVE Final    Comment:        The GeneXpert MRSA Assay (FDA approved for NASAL specimens only), is one component of a comprehensive MRSA colonization surveillance program. It is not intended to diagnose MRSA infection nor to guide or monitor treatment for MRSA  infections.   Blood culture (single)     Status: None   Collection Time: 08/09/2015  3:36 PM  Result Value Ref Range Status   Specimen Description BLOOD RIGHT ASSIST CONTROL  Final   Special Requests  BOTTLES DRAWN AEROBIC AND ANAEROBIC  Final   Culture NO GROWTH 5 DAYS  Final   Report Status 08/17/2015 FINAL  Final  Wound culture     Status: None   Collection Time: 08/13/15 12:37 PM  Result Value Ref Range Status   Specimen Description WOUND  Final   Special Requests Normal  Final   Gram Stain   Final    FEW WBC SEEN TOO NUMEROUS TO COUNT GRAM NEGATIVE RODS FEW GRAM POSITIVE COCCI    Culture   Final    HEAVY GROWTH ESCHERICHIA COLI MODERATE GROWTH PROTEUS MIRABILIS LIGHT GROWTH KLEBSIELLA PNEUMONIAE MODERATE GROWTH ENTEROCOCCUS GALLINARUM CRITICAL RESULT CALLED TO, READ BACK BY AND VERIFIED WITH: [facility] BORBA AT 1042 08/16/15 DV    Report Status 08/17/2015 FINAL  Final   Organism ID, Bacteria ESCHERICHIA COLI  Final   Organism ID, Bacteria PROTEUS MIRABILIS  Final   Organism ID, Bacteria KLEBSIELLA PNEUMONIAE  Final   Organism ID, Bacteria ENTEROCOCCUS GALLINARUM  Final      Susceptibility   Escherichia coli - MIC*    AMPICILLIN >=32 RESISTANT Resistant     CEFTAZIDIME 4 RESISTANT Resistant     CEFAZOLIN >=64 RESISTANT Resistant     CEFTRIAXONE 16 RESISTANT Resistant     GENTAMICIN 2 SENSITIVE Sensitive     IMIPENEM >=16 RESISTANT Resistant     TRIMETH/SULFA <=20 SENSITIVE Sensitive     Extended ESBL POSITIVE Resistant     PIP/TAZO Value in next row Resistant      RESISTANT>=128    CIPROFLOXACIN Value in next row Sensitive      SENSITIVE<=0.25    * HEAVY GROWTH ESCHERICHIA COLI   Klebsiella pneumoniae - MIC*    AMPICILLIN Value in next row Resistant      SENSITIVE<=0.25    CEFTAZIDIME Value in next row Resistant      SENSITIVE<=0.25    CEFAZOLIN Value in next row Resistant      SENSITIVE<=0.25    CEFTRIAXONE Value in next row Resistant      SENSITIVE<=0.25     CIPROFLOXACIN Value in next row Resistant      SENSITIVE<=0.25    GENTAMICIN Value in next row Sensitive      SENSITIVE<=0.25    IMIPENEM Value in next row Resistant      SENSITIVE<=0.25    TRIMETH/SULFA Value in next row Resistant      SENSITIVE<=0.25    PIP/TAZO Value in next row Resistant      RESISTANT>=128    * LIGHT GROWTH KLEBSIELLA PNEUMONIAE   Proteus mirabilis - MIC*    AMPICILLIN Value in next row Resistant      RESISTANT>=128    CEFTAZIDIME Value in next row Sensitive      RESISTANT>=128    CEFAZOLIN Value in next row Sensitive      RESISTANT>=128    CEFTRIAXONE Value in next row Sensitive      RESISTANT>=128    CIPROFLOXACIN Value in next row Sensitive      RESISTANT>=128    GENTAMICIN Value in next row Sensitive      RESISTANT>=128    IMIPENEM Value in next row Sensitive      RESISTANT>=128    TRIMETH/SULFA Value in next row Sensitive      RESISTANT>=128    PIP/TAZO Value in next row Sensitive      SENSITIVE<=4    * MODERATE GROWTH PROTEUS MIRABILIS   Enterococcus gallinarum - MIC*    AMPICILLIN Value in next row Resistant  SENSITIVE<=4    GENTAMICIN SYNERGY Value in next row Sensitive      SENSITIVE<=4    CIPROFLOXACIN Value in next row Resistant      RESISTANT>=8    TETRACYCLINE Value in next row Resistant      RESISTANT>=16    * MODERATE GROWTH ENTEROCOCCUS GALLINARUM  Wound culture     Status: None   Collection Time: 08/15/15  2:53 PM  Result Value Ref Range Status   Specimen Description WOUND  Final   Special Requests NONE  Final   Gram Stain FEW WBC SEEN NO ORGANISMS SEEN   Final   Culture NO GROWTH 3 DAYS  Final   Report Status 08/18/2015 FINAL  Final  C difficile quick scan w PCR reflex     Status: None   Collection Time: 08/17/15 11:34 AM  Result Value Ref Range Status   C Diff antigen NEGATIVE NEGATIVE Final   C Diff toxin NEGATIVE NEGATIVE Final   C Diff interpretation Negative for C. difficile  Final  Stool culture     Status:  None   Collection Time: 09/03/15  3:51 PM  Result Value Ref Range Status   Specimen Description STOOL  Final   Special Requests Immunocompromised  Final   Culture   Final    NO SALMONELLA OR SHIGELLA ISOLATED No Pathogenic E. coli detected NO CAMPYLOBACTER DETECTED    Report Status 09/07/2015 FINAL  Final  C difficile quick scan w PCR reflex     Status: None   Collection Time: 09/13/15 12:51 AM  Result Value Ref Range Status   C Diff antigen NEGATIVE NEGATIVE Final   C Diff toxin NEGATIVE NEGATIVE Final   C Diff interpretation Negative for C. difficile  Final  Culture, blood (routine x 2)     Status: None   Collection Time: 09/16/15 11:24 AM  Result Value Ref Range Status   Specimen Description BLOOD LEFT HAND  Final   Special Requests BOTTLES DRAWN AEROBIC AND ANAEROBIC  1CC  Final   Culture NO GROWTH 5 DAYS  Final   Report Status 09/21/2015 FINAL  Final  Culture, blood (routine x 2)     Status: None   Collection Time: 09/16/15 12:23 PM  Result Value Ref Range Status   Specimen Description BLOOD RIGHT HAND  Final   Special Requests BOTTLES DRAWN AEROBIC AND ANAEROBIC  1CC  Final   Culture  Setup Time   Final    GRAM POSITIVE COCCI AEROBIC BOTTLE ONLY CRITICAL RESULT CALLED TO, READ BACK BY AND VERIFIED WITH: TESS THOMAS,RN 09/17/2015 0631 BY JRS.    Culture   Final    ENTEROCOCCUS FAECALIS AEROBIC BOTTLE ONLY VRE HAVE INTRINSIC RESISTANCE TO MOST COMMONLY USED ANTIBIOTICS AND THE ABILITY TO ACQUIRE RESISTANCE TO MOST AVAILABLE ANTIBIOTICS. CRITICAL RESULT CALLED TO, READ BACK BY AND VERIFIED WITH: CHERYL SMITH AT 1610 09/19/15 DV    Report Status 09/21/2015 FINAL  Final   Organism ID, Bacteria ENTEROCOCCUS FAECALIS  Final      Susceptibility   Enterococcus faecalis - MIC*    AMPICILLIN <=2 SENSITIVE Sensitive     LINEZOLID 2 SENSITIVE Sensitive     CIPROFLOXACIN Value in next row Resistant      RESISTANT>=8    TETRACYCLINE Value in next row Resistant       RESISTANT>=16    VANCOMYCIN Value in next row Resistant      RESISTANT>=32    GENTAMICIN SYNERGY Value in next row Resistant  RESISTANT>=32    * ENTEROCOCCUS FAECALIS  Culture, respiratory (NON-Expectorated)     Status: None   Collection Time: 09/16/15  3:50 PM  Result Value Ref Range Status   Specimen Description TRACHEAL ASPIRATE  Final   Special Requests Immunocompromised  Final   Gram Stain   Final    FEW WBC SEEN GOOD SPECIMEN - 80-90% WBCS RARE GRAM NEGATIVE RODS    Culture LIGHT GROWTH PSEUDOMONAS AERUGINOSA  Final   Report Status 09/19/2015 FINAL  Final   Organism ID, Bacteria PSEUDOMONAS AERUGINOSA  Final      Susceptibility   Pseudomonas aeruginosa - MIC*    CEFTAZIDIME 8 SENSITIVE Sensitive     CIPROFLOXACIN 2 INTERMEDIATE Intermediate     GENTAMICIN >=16 RESISTANT Resistant     IMIPENEM >=16 RESISTANT Resistant     PIP/TAZO Value in next row Sensitive      SENSITIVE32    CEFEPIME Value in next row Sensitive      SENSITIVE8    LEVOFLOXACIN Value in next row Resistant      RESISTANT>=8    * LIGHT GROWTH PSEUDOMONAS AERUGINOSA  Culture, expectorated sputum-assessment     Status: None   Collection Time: 09/22/15  2:09 PM  Result Value Ref Range Status   Specimen Description ENDOTRACHEAL  Final   Special Requests Normal  Final   Sputum evaluation THIS SPECIMEN IS ACCEPTABLE FOR SPUTUM CULTURE  Final   Report Status 09/24/2015 FINAL  Final  Culture, respiratory (NON-Expectorated)     Status: None   Collection Time: 09/22/15  2:09 PM  Result Value Ref Range Status   Specimen Description ENDOTRACHEAL  Final   Special Requests Normal Reflexed from [id number]  Final   Gram Stain   Final    FEW WBC SEEN FEW GRAM NEGATIVE RODS POOR SPECIMEN - LESS THAN 70% WBCS    Culture   Final    MODERATE GROWTH PSEUDOMONAS AERUGINOSA LIGHT GROWTH KLEBSIELLA PNEUMONIAE REFER TO SENSITIVITIES FROM PREVIOUS CULTURE FOR ORG 2    Report Status 10/01/2015 FINAL  Final   Organism  ID, Bacteria PSEUDOMONAS AERUGINOSA  Final   Organism ID, Bacteria KLEBSIELLA PNEUMONIAE  Final      Susceptibility   Pseudomonas aeruginosa - MIC*    CEFTAZIDIME 4 SENSITIVE Sensitive     CIPROFLOXACIN 2 INTERMEDIATE Intermediate     GENTAMICIN >=16 RESISTANT Resistant     IMIPENEM >=16 RESISTANT Resistant     PIP/TAZO Value in next row Sensitive      SENSITIVE16    * MODERATE GROWTH PSEUDOMONAS AERUGINOSA  Tissue culture     Status: None   Collection Time: 09/24/15  6:44 AM  Result Value Ref Range Status   Specimen Description BONE  Final   Special Requests Normal  Final   Gram Stain MODERATE WBC SEEN FEW GRAM NEGATIVE RODS   Final   Culture   Final    MODERATE GROWTH ESCHERICHIA COLI MODERATE GROWTH KLEBSIELLA PNEUMONIAE ESBL-EXTENDED SPECTRUM BETA LACTAMASE-THE ORGANISM IS RESISTANT TO PENICILLINS, CEPHALOSPORINS AND AZTREONAM ACCORDING TO CLSI M100-S15 VOL.25 N01 JAN 2005. ORGANISM 1 This organism isolate is resistant to one or more antiotic agents in three or more antimicrobial categories.  Suggest Infectious Disease consult.   ORGANISM 2 CRITICAL RESULT CALLED TO, READ BACK BY AND VERIFIED WITH: RN Mardene CelesteJOANNA LINDSAY 09/27/15 1005AM    Report Status 09/28/2015 FINAL  Final   Organism ID, Bacteria ESCHERICHIA COLI  Final   Organism ID, Bacteria KLEBSIELLA PNEUMONIAE  Final  Susceptibility   Escherichia coli - MIC*    AMPICILLIN >=32 RESISTANT Resistant     CEFTAZIDIME 4 RESISTANT Resistant     CEFAZOLIN >=64 RESISTANT Resistant     CEFTRIAXONE 8 RESISTANT Resistant     GENTAMICIN <=1 SENSITIVE Sensitive     IMIPENEM 8 RESISTANT Resistant     TRIMETH/SULFA <=20 SENSITIVE Sensitive     Extended ESBL POSITIVE Resistant     PIP/TAZO Value in next row Resistant      RESISTANT>=128    * MODERATE GROWTH ESCHERICHIA COLI   Klebsiella pneumoniae - MIC*    AMPICILLIN Value in next row Resistant      RESISTANT>=128    CEFTAZIDIME Value in next row Resistant      RESISTANT>=128     CEFAZOLIN Value in next row Resistant      RESISTANT>=128    CEFTRIAXONE Value in next row Resistant      RESISTANT>=128    CIPROFLOXACIN Value in next row Resistant      RESISTANT>=128    GENTAMICIN Value in next row Resistant      RESISTANT>=128    IMIPENEM Value in next row Resistant      RESISTANT>=128    TRIMETH/SULFA Value in next row Sensitive      RESISTANT>=128    PIP/TAZO Value in next row Resistant      RESISTANT>=128    * MODERATE GROWTH KLEBSIELLA PNEUMONIAE  Anaerobic culture     Status: None   Collection Time: 09/24/15  3:55 PM  Result Value Ref Range Status   Specimen Description BONE  Final   Special Requests Normal  Final   Culture NO ANAEROBES ISOLATED  Final   Report Status 09/29/2015 FINAL  Final  Culture, fungus without smear     Status: None   Collection Time: 09/24/15  3:55 PM  Result Value Ref Range Status   Specimen Description BONE  Final   Special Requests Normal  Final   Culture NO FUNGUS ISOLATED AFTER 24 DAYS  Final   Report Status 10/18/2015 FINAL  Final  C difficile quick scan w PCR reflex     Status: None   Collection Time: 10/07/15  2:02 PM  Result Value Ref Range Status   C Diff antigen NEGATIVE NEGATIVE Final   C Diff toxin NEGATIVE NEGATIVE Final   C Diff interpretation Negative for C. difficile  Final  Culture, blood (routine x 2)     Status: None   Collection Time: 10/16/15  9:52 AM  Result Value Ref Range Status   Specimen Description BLOOD LEFT HAND  Final   Special Requests   Final    BOTTLES DRAWN AEROBIC AND ANAEROBIC  AER 6CC ANA 4CC   Culture NO GROWTH 6 DAYS  Final   Report Status 10/22/2015 FINAL  Final  Culture, blood (routine x 2)     Status: None   Collection Time: 10/16/15 12:25 PM  Result Value Ref Range Status   Specimen Description BLOOD LEFT HAND  Final   Special Requests BOTTLES DRAWN AEROBIC AND ANAEROBIC  5CC  Final   Culture NO GROWTH 6 DAYS  Final   Report Status 10/22/2015 FINAL  Final  Culture,  expectorated sputum-assessment     Status: None   Collection Time: 10/18/15  1:15 PM  Result Value Ref Range Status   Specimen Description SPUTUM  Final   Special Requests NONE  Final   Sputum evaluation THIS SPECIMEN IS ACCEPTABLE FOR SPUTUM CULTURE  Final  Report Status 10/18/2015 FINAL  Final  Culture, respiratory (NON-Expectorated)     Status: None   Collection Time: 10/18/15  1:15 PM  Result Value Ref Range Status   Specimen Description SPUTUM  Final   Special Requests NONE Reflexed from [id number]  Final   Gram Stain   Final    GOOD SPECIMEN - 80-90% WBCS MODERATE WBC SEEN MANY GRAM NEGATIVE RODS    Culture   Final    HEAVY GROWTH PSEUDOMONAS AERUGINOSA MODERATE GROWTH KLEBSIELLA PNEUMONIAE CRITICAL RESULT CALLED TO, READ BACK BY AND VERIFIED WITHRod Mae AT 1478 10/21/15 DV KLEBSIELLA PNEUMONIAE This organism isolate is resistant to one or more antiotic agents in three or more antimicrobial categories.  Suggest Infectious Disease  consult.      Report Status 10/22/2015 FINAL  Final   Organism ID, Bacteria PSEUDOMONAS AERUGINOSA  Final   Organism ID, Bacteria KLEBSIELLA PNEUMONIAE  Final      Susceptibility   Klebsiella pneumoniae - MIC*    AMPICILLIN >=32 RESISTANT Resistant     CEFAZOLIN >=64 RESISTANT Resistant     CEFTRIAXONE >=64 RESISTANT Resistant     CIPROFLOXACIN >=4 RESISTANT Resistant     GENTAMICIN >=16 RESISTANT Resistant     IMIPENEM >=16 RESISTANT Resistant     NITROFURANTOIN >=512 RESISTANT Resistant     TRIMETH/SULFA <=20 SENSITIVE Sensitive     * MODERATE GROWTH KLEBSIELLA PNEUMONIAE   Pseudomonas aeruginosa - MIC*    CEFTAZIDIME 4 SENSITIVE Sensitive     CIPROFLOXACIN >=4 RESISTANT Resistant     GENTAMICIN 8 INTERMEDIATE Intermediate     IMIPENEM >=16 RESISTANT Resistant     PIP/TAZO Value in next row Sensitive      SENSITIVE32    * HEAVY GROWTH PSEUDOMONAS AERUGINOSA  C difficile quick scan w PCR reflex     Status: None   Collection Time:  10/18/15  4:39 PM  Result Value Ref Range Status   C Diff antigen NEGATIVE NEGATIVE Final   C Diff toxin NEGATIVE NEGATIVE Final   C Diff interpretation Negative for C. difficile  Final  Culture, blood (Routine X 2) w Reflex to ID Panel     Status: None   Collection Time: 11/06/15  8:27 AM  Result Value Ref Range Status   Specimen Description BLOOD RIGHT HAND  Final   Special Requests BOTTLES DRAWN AEROBIC AND ANAEROBIC 3CC  Final   Culture NO GROWTH 5 DAYS  Final   Report Status 11/11/2015 FINAL  Final  Culture, blood (Routine X 2) w Reflex to ID Panel     Status: None   Collection Time: 11/06/15  8:29 AM  Result Value Ref Range Status   Specimen Description BLOOD LEFT HAND  Final   Special Requests BOTTLES DRAWN AEROBIC AND ANAEROBIC  1CC  Final   Culture NO GROWTH 5 DAYS  Final   Report Status 11/11/2015 FINAL  Final  Culture, expectorated sputum-assessment     Status: None   Collection Time: 11/06/15  8:41 AM  Result Value Ref Range Status   Specimen Description EXPECTORATED SPUTUM  Final   Special Requests Immunocompromised  Final   Sputum evaluation THIS SPECIMEN IS ACCEPTABLE FOR SPUTUM CULTURE  Final   Report Status 11/06/2015 FINAL  Final  Culture, respiratory (NON-Expectorated)     Status: None   Collection Time: 11/06/15  8:41 AM  Result Value Ref Range Status   Specimen Description EXPECTORATED SPUTUM  Final   Special Requests Immunocompromised Reflexed from [id number]  Final  Gram Stain   Final    MANY WBC SEEN MANY GRAM NEGATIVE RODS EXCELLENT SPECIMEN - 90-100% WBCS    Culture   Final    MODERATE GROWTH KLEBSIELLA PNEUMONIAE MODERATE GROWTH PSEUDOMONAS AERUGINOSA CRITICAL RESULT CALLED TO, READ BACK BY AND VERIFIED WITH: DR. FITZGERALD AT 1120 11/09/15 DV DR. FITZGERALD REQUESTED ORGANISM BE SENT OUT FOR TIGECYCLINE CEFT/AVIBACTAM AND COLISTIN Sent to [facility] for further susceptibility testing. 11/11/15 CTJ    Report Status 11/15/2015 FINAL  Final   Organism  ID, Bacteria KLEBSIELLA PNEUMONIAE  Final   Organism ID, Bacteria PSEUDOMONAS AERUGINOSA  Final      Susceptibility   Klebsiella pneumoniae - MIC*    AMPICILLIN >=32 RESISTANT Resistant     CEFAZOLIN >=64 RESISTANT Resistant     CEFTRIAXONE 32 RESISTANT Resistant     CIPROFLOXACIN >=4 RESISTANT Resistant     GENTAMICIN >=16 RESISTANT Resistant     IMIPENEM >=16 RESISTANT Resistant     NITROFURANTOIN 256 RESISTANT Resistant     TRIMETH/SULFA >=320 RESISTANT Resistant     Extended ESBL POSITIVE Resistant     * MODERATE GROWTH KLEBSIELLA PNEUMONIAE   Pseudomonas aeruginosa - MIC*    CEFTAZIDIME >=64 RESISTANT Resistant     CIPROFLOXACIN 2 INTERMEDIATE Intermediate     GENTAMICIN >=16 RESISTANT Resistant     IMIPENEM >=16 RESISTANT Resistant     * MODERATE GROWTH PSEUDOMONAS AERUGINOSA  Culture, respiratory (NON-Expectorated)     Status: None   Collection Time: 11/14/15  2:07 PM  Result Value Ref Range Status   Specimen Description TRACHEAL ASPIRATE  Final   Special Requests NONE  Final   Gram Stain   Final    EXCELLENT SPECIMEN - 90-100% WBCS MANY WBC SEEN MODERATE GRAM NEGATIVE RODS    Culture   Final    HEAVY GROWTH PSEUDOMONAS AERUGINOSA CRITICAL VALUE NOTED.  VALUE IS CONSISTENT WITH PREVIOUSLY REPORTED AND CALLED VALUE.    Report Status 11/16/2015 FINAL  Final   Organism ID, Bacteria PSEUDOMONAS AERUGINOSA  Final      Susceptibility   Pseudomonas aeruginosa - MIC*    CEFTAZIDIME >=64 RESISTANT Resistant     CIPROFLOXACIN 2 INTERMEDIATE Intermediate     GENTAMICIN >=16 RESISTANT Resistant     IMIPENEM >=16 RESISTANT Resistant     PIP/TAZO Value in next row Resistant      RESISTANT>=128    TOBRAMYCIN Value in next row Sensitive      SENSITIVE2    * HEAVY GROWTH PSEUDOMONAS AERUGINOSA    Coagulation Studies: No results for input(s): LABPROT, INR in the last 72 hours.  Urinalysis: No results for input(s): COLORURINE, LABSPEC, PHURINE, GLUCOSEU, HGBUR, BILIRUBINUR,  KETONESUR, PROTEINUR, UROBILINOGEN, NITRITE, LEUKOCYTESUR in the last 72 hours.  Invalid input(s): APPERANCEUR    Imaging: Dg Abd Portable 1v  11/23/2015  CLINICAL DATA:  Evaluation of impaired orogastric feeding tube. EXAM: PORTABLE ABDOMEN - 1 VIEW COMPARISON:  11/04/2015 FINDINGS: Feeding tube tip is in the antrum of the stomach, essentially unchanged since the prior exam. No visible kink in the visualized portion of the tube. Bowel gas pattern is normal. Scattered areas of intraperitoneal barium appear unchanged. Chronic degenerative changes in the lower lumbar spine. Osteopenia of the pelvis and hips. IMPRESSION: No acute abnormality. No change in the appearance or position of the visualized portion of the feeding tube. Electronically Signed   By: Francene Boyers M.D.   On: 11/23/2015 11:55     Medications:   . dextrose 25 mL/hr at 11/25/15  0900  . feeding supplement (VITAL AF 1.2 CAL) 1,000 mL (11/25/15 0900)   . albumin human  12.5 g Intravenous Once  . albumin human  12.5 g Intravenous Once  . albumin human  12.5 g Intravenous Once  . albuterol  2.5 mg Nebulization Q6H  . amiodarone  100 mg Per Tube Daily  . antiseptic oral rinse  7 mL Mouth Rinse QID  . aspirin  81 mg Per Tube Daily  . chlorhexidine gluconate  15 mL Mouth Rinse BID  . colistimethate (COLISTIN) IV  250 mg Intravenous Q48H  . epoetin (EPOGEN/PROCRIT) injection  10,000 Units Intravenous Q M,W,F-HD  . erythromycin  200 mg Intravenous 3 times per day  . escitalopram  10 mg Per Tube Daily  . feeding supplement (PRO-STAT SUGAR FREE 64)  30 mL Per Tube 6 X Daily  . free water  20 mL Per Tube 6 times per day  . heparin subcutaneous  5,000 Units Subcutaneous Q12H  . hydrocortisone  10 mg Per Tube BID  . lidocaine  1 patch Transdermal Q24H  . midodrine  10 mg Per Tube TID WC  . mirtazapine  7.5 mg Per Tube QHS  . multivitamin  5 mL Per Tube Daily  . pantoprazole sodium  40 mg Per Tube Daily  . sodium chloride  10-40  mL Intracatheter Q12H  . sodium hypochlorite   Irrigation BID  . tigecycline (TYGACIL) IVPB  50 mg Intravenous Q12H  . tobramycin (PF)  300 mg Nebulization BID   acetaminophen (TYLENOL) oral liquid 160 mg/5 mL, albuterol, HYDROmorphone (DILAUDID) injection, loperamide, ondansetron (ZOFRAN) IV, oxyCODONE, polyvinyl alcohol, sodium chloride, zinc oxide  Assessment/ Plan:  50 y.o. 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 Admitted on 08/11/2015  Echo: 11/07/19: EF of 30% to 35%.   1. End-stage renal disease on hemodialysis on HD MWF. The patient has been on dialysis since October of 2014. R IJ permcath.  - Dialysis treatment with IV albumin for oncotic support.  - Midodrine and hydrocortisone ordered for blood pressure support.  - Continue MWF schedule, monitor daily for dailysis treatment.    2. Anemia of CKD. Hemoglobin 9.8. Multiple transfusions during admission most recently on 1/1 - Epogen and periodic blood transfusions. Not a candidate for IV iron.   3. Acute resp failure: ventilator dependent -prolonged course on ventilator, fio2 currently 40% with PEEP of 5.  - Appreciate pulmonary input. ENT recently evaluated tracheostomy  4. Sepsis: pseudomonas in tracheal aspirate - tobramycin inhaled  - tigecycline.   5. Secondary Hyperparathyroidism: PTH 50, phos 5.2 - not currently on a binder or vitamin D agent.      LOS: 105 Courtney [name] 1/6/20179:36 AM

## 2015-11-25 NOTE — Progress Notes (Signed)
PULMONARY / CRITICAL CARE MEDICINE   Name: Courtney [name] MRN: [id number] DOB: Apr 28, 1966    ADMISSION DATE:  08/01/2015  BRIEF HISTORY: 49 AAF who has been in medical facilities (hosp, LTAC, rehab) for 2 yrs following gastric bypass surgery with multiple complications. Now with chronic trach, ESRD, profound debilitation, severe sacral pressure ulcer. Was seemingly making progress and transferred to rehab facility approx one week prior to this admission. She was sent to [facility] ED with AMS and hypotension. Working dx of severe sepsis/septic shock due to infected sacral pressure ulcer. Since admission. Her course is been very complicated with numerous complications including septic shock and GI bleeding. Now with failure to wean from vent and possible MI event, repeat ECHO EF 30-35, possible old MI, severe hypokinesis of the nnterior and anteroseptal myocardium   SUBJ: RASS -1. HD today  Summary of MAJOR EVENTS/TEST RESULTS: Admission 02/07/14-05/07/14 Admission 07/21/14-09/06/14 Discharged to [facility]. Pt had palliative consult at that time, were asked to sign off by husband.  09/23 CT head: NAD 09/23 EEG: no epileptiform activity 09/23 PRBCs for Hgb 6.4 09/24 bedside debridement of sacral wound. Abscess drained 09/25 Off vasopressors. More alert. No distress. Worsening thrombocytopenia. Vanc DC'd 09/29 Dr. [name] (I.D) excused from the case by patient's husband. 10/02 MRI -multiple infarcts 10/03 tracheal bleeding- transfused platelets 10/03 hospitalist service excused from the case by patient's husband 10/03 Echocardiogram ejection fraction was 55-60%, pulmonary systolic pressure was 39 mmHg 40/9810/08 restart TF's at lower rate, attempt reg HD  10/12 Transferred to med-surg floor. Remains on PCCM service 10/14 SLP eval: pt unable to tolerate PMV adequately 10/17-will re-attempt PM valve-discussed with Speech therapist 10/18 passed swallow eval-start pureed thick foods no thin  liquids-continue NG feeds 10/19 transferred to step down for sepsis/aspiration pneumonia 10/19 cxr shows RLL opacity 10/21 sacral decub debride at bedside by surgery 10/22 started back on vasopressors while on HD 10/27 CT with osteo, R hip fx, unable to identify tip of dubhoff tube - sent for fluoro study 10/28 Ortho consultation: I do not feel that she is a surgical candidate. Therefore, I feel that it would best to manage this fracture nonsurgically and allow it to heal by itself over time, which it should.  10/28 Gen Surg consultation: Due to the lack of free air or free flow of contrast and the peritoneum there is no indication for any surgical intervention on this. Would recommend pulling back the feeding tube 1-2 cm. Would recheck an abdominal film to confirm no pneumoperitoneum in the morning. Absence of any changes okay to continue using Dobbhoff for feeding and medications. 10/28 gastrograffin study: The study confirms that the feeding tube pes perforated through the duodenum and the tip is within a cavity that fills with injected contrast. The cavity does appear walled off 11/4 refuses oral feeds, continue TF's CT reviewed: T8-T9 discitis/osteomyelitis, RT HIP FRACTURE 11/5 placed back on vasopressors, placed back on Vent due to resp acidosis, levophed turned off 11/6 afternoon 11/5 PM Trach changed out due to cuff leak, #6 Shiley cuffed, 11/6 dubhoff occluded, removed and replaced, new dubhoff shows tube in the antral stomach 11/15 refusing to take oral feeds 11/18 placed back on Vent for increased WOB,SOB. 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 10/18/2015; the patient developed a new left basal pneumonia; with recurrent sepsis.  10/19/2015; fevers resolved with IV acetaminophen. Tube feeds changed from vital to Jevity.  12/7 CXR  with stable b/l opacities.  12/12 elevated K s/p HD-remains on AC  mode 12/16-vomiting-hold feeds and re-assess in next 24 hrs 12/17- TF restarted at 1/2 goal (goal = 50cc/per) 12/18- elevated wbc, fever (101.4), recultured, restarted on ceftaz 12/19 Pericardial friction rub. Echocardiogram: LVEF 30-35%, anteroseptal hypokinesis or akinesis c/w anterior wall MI 12/20 Dr Sung Amabile discussed new echocardiogram findings with pt's husband and explained that this likely represents an anterior wall MI sometime in the previous couple of weeks. It was explained that she is not a candidate for any cardiology intervention. Code status was again discussed. Husband expressed understanding that she was not going to survive this illness in any favorable way but continues to request that she be full code status based on what he believes to be her wishes 12/21 vomitied 50cc of TF - AM TF held, restart TF at 3pm @30cc  12/26 Persistent intermittent trach cuff leak. Distal XLT trach requested.  12/27 15 beat NSVT 12/28 patient refused HD and refused Bath time 12/29 D10 infusion for hypoglycemia, TF's on hold due to intolerance 12/30 CODE BLUE  VTACH 01/01 transfuse 2 units of blood 01/02 increased respiratory distress, copious purulent secretions 01/02 LE venous US: No DVT 01/03 Repeat TTE: The cavity size was mildly dilated. Systolic function was moderately to severely reduced. The estimated ejection fraction was in the range of 30% to 35%. Diffuse hypokinesis. Moderate to severe hypokinesis of the anteroseptal, anterior, and apical myocardium. Hypokinesis of the anterior myocardium 01/05 Hypothermic - Bair Hugger ordered  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 History of sepsis from C. glabrata MRSA PCR 9/23 >> NEG Wound (swab) 9/23 >> multiple  organisms Wound (debridement) 9/24 >> Enterococcus, K. Pneumoniae, P. Mirabilis, VRE Wound 9/26 >> No growth Blood 9/23 >> NEG CDiff 9/27>>neg Stool Cx 10/15>> negative Cdiff 10/25>>neg Trach Aspirate 10/28>> light growth pseudomonas Blood 10/28 >> 1/2 GPC >>  Sputum cultures obtained 09/22/15 due to mucus plugging>>Pseudomonas BONE TISSUE Cx 11/3>>E.coli (ESBL), k. Pneumonia (daptomycin and septra) Bld Cx 11/27>>negative thus far.  Trach Asp 11/27>> grew out Pseudomonas, and Klebsiella pneumonia. Trach Asp 12/18>> MDR Klebsiella, Pseudomonas Bld Cx 12/18>> NEG 12/26 resp culture >> MDR pseudomonas  ANTIMICROBIALS:  Aztreonam 9/23 >> 9/24 Vanc 9/23 >> 9/25 Vanc 9/26>>9/27 Daptomycin 9/27>> 10/12, 10/28>> off Meropenem 9/23 >> 10/14, 10/28 >> 10/31 Zosyn 11/27,>> 11/30. Levaquin 11/29>> 11/29. Ceftaz 12/1>>12/11, 12/18>> 12/24 Colisitin 12/26 >>  Tigecycline 12/26 >>  Nebulized tobramycin 12/30 >>  VITAL SIGNS: Temp:  [97.5 F (36.4 C)-98.4 F (36.9 C)] 97.5 F (36.4 C) (01/06 1134) Pulse Rate:  [72-80] 79 (01/06 0900) Resp:  [16-23] 20 (01/06 0900) BP: (92-129)/(43-64) 102/57 mmHg (01/06 0900) SpO2:  [94 %-100 %] 94 % (01/06 0900) FiO2 (%):  [40 %] 40 % (01/06 1317) HEMODYNAMICS:   VENTILATOR SETTINGS: Vent Mode:  [-] PRVC FiO2 (%):  [40 %] 40 % Set Rate:  [14 bmp] 14 bmp Vt Set:  [500 mL] 500 mL PEEP:  [5 cmH20] 5 cmH20 INTAKE / OUTPUT:  Intake/Output Summary (Last 24 hours) at 11/25/15 1515 Last data filed at 11/25/15 1100  Gross per 24 hour  Intake   1340 ml  Output  -2000 ml  Net   3340 ml    Review of Systems  Unable to perform ROS  Physical Exam  RASS 0, intermittent trach tube cuff leak, intermittent dyssynchrony with vent HEENT; Cushingoid facies, temporal wasting, NGT present Trach site clean Diffuse rhonchi Reg with occ extrasystoles, no M Abdomen soft, + BS Ext cool, trace symmetric edema, marked muscle wasting Severe sacral decubitus  ulcer-   LABS:  CBC  Recent Labs Lab 11/22/15 0925 11/23/15 1339 11/25/15 0938  WBC 18.1* 10.6 12.8*  HGB 9.6* 9.8* 9.0*  HCT 30.7* 31.7* 29.2*  PLT 153 119* 99*   Coag's  Recent Labs Lab 11/21/15 2012  APTT 41*  INR 1.57   BMET  Recent Labs Lab 11/21/15 1318 11/23/15 1339 11/25/15 0938  NA 131* 131* 133*  K 3.6 3.8 3.5  CL 96* 96* 97*  CO2 28 29 27   BUN 72* 72* 72*  CREATININE 1.46* 1.45* 1.13*  GLUCOSE 117* 101* 78   Electrolytes  Recent Labs Lab 11/19/15 0630 11/21/15 0147 11/21/15 1318 11/23/15 1339 11/25/15 0938  CALCIUM 8.1* 8.0* 8.0* 8.1* 7.8*  MG 2.1 2.1 2.1  --   --   PHOS  --   --   --  5.2* 4.9*   Sepsis Markers No results for input(s): LATICACIDVEN, PROCALCITON, O2SATVEN in the last 168 hours. ABG  Recent Labs Lab 11/21/15 1745  PHART 7.41  PCO2ART 47  PO2ART 78*   Liver Enzymes  Recent Labs Lab 11/23/15 1339 11/25/15 0938  ALBUMIN 1.5* 1.5*   Cardiac Enzymes  Recent Labs Lab 11/21/15 2012 11/22/15 0202 11/22/15 0925  TROPONINI 0.05* 0.16* 0.06*   Glucose  Recent Labs Lab 11/24/15 0437 11/24/15 0730 11/24/15 1126 11/24/15 1612 11/25/15 0055 11/25/15 0746  GLUCAP 92 90 85 83 77 89    Imaging No results found.  ASSESSMENT / PLAN: IMPRESSION: Very prolonged and complicated hospitalization after gastric bypass surgery 2014 Never returned home ESRD Recurrent severe sepsis - PAN resistant Klebisella in sputum Recurrent PNA-now with pseudomonas H/O C diff H/O VTE DM2 - controlled Episodic hypoglycemia Chronic steroid therapy, for a history of of adrenal insufficiency Incidental finding of R hip fx - conservative mgmt. 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 Husband declined  TEE Profound deconditioning Chronic pain New finding of pericardial friction rub 12/19 - resolved New finding of cardiomyopathy, likely post MI 12/19  PAF/flutter Sinus bradycardia HCAP with MDR Donalee Citrin and pseudomonas Chronic anemia Severe protein - calorie malnutrition TF intolerance Mild thrombocytopenia 01/06  Cont vent support - settings reviewed and/or adjusted Cont vent bundle Cont HD per renal Monitor BMET intermittently Monitor I/Os Correct electrolytes as indicated DVT px: SQ heparin Monitor CBC intermittently Transfuse per usual guidelines Resume TF's at low rate Monitor temp, WBC count Micro and abx as above ID following - appreciate recs Cont Amiodarone - 100 mg per tube daily DC metoprolol Cont opiates PRN Cont D10 Infusion for hypoglycemia Resume TFs @ 10 cc/hr   Prognosis remains very poor without hope for functional recovery.   The above time includes time spent in consultation with patient and/or family members and reviewing care plan on multidisciplinary rounds  Billy Fischer, MD PCCM service Mobile [phone] Pager [phone]

## 2015-11-26 LAB — GLUCOSE, CAPILLARY
GLUCOSE-CAPILLARY: 129 mg/dL — AB (ref 65–99)
Glucose-Capillary: 106 mg/dL — ABNORMAL HIGH (ref 65–99)

## 2015-11-26 NOTE — Progress Notes (Signed)
ANTIBIOTIC CONSULT NOTE - FOLLOW UP   Pharmacy Consult for Colistin and Tigecycline  Indication: MDR Respiratory Infection   Allergies  Allergen Reactions  . Contrast Media [Iodinated Diagnostic Agents] Anaphylaxis  . Ampicillin Rash   Patient Measurements: Height:  (SCALE BROKE) Weight: 224 lb 13.9 oz (102 kg) IBW/kg (Calculated) : 66.2  Vital Signs: Temp: 98.7 F (37.1 C) (01/07 0400) Temp Source: Axillary (01/07 0400) BP: 123/74 mmHg (01/07 0500) Pulse Rate: 80 (01/07 0500) Intake/Output from previous day: 01/06 0701 - 01/07 0700 In: 1361.5 [I.V.:575; NG/GT:686.5; IV Piggyback:100] Out: -2000  Intake/Output from this shift:    Recent Labs  11/23/15 1339 11/25/15 0938  WBC 10.6 12.8*  HGB 9.8* 9.0*  PLT 119* 99*  CREATININE 1.45* 1.13*   Estimated Creatinine Clearance: 75.7 mL/min (by C-G formula based on Cr of 1.13). No results for input(s): VANCOTROUGH, VANCOPEAK, VANCORANDOM, GENTTROUGH, GENTPEAK, GENTRANDOM, TOBRATROUGH, TOBRAPEAK, TOBRARND, AMIKACINPEAK, AMIKACINTROU, AMIKACIN in the last 72 hours.   Microbiology: Recent Results (from the past 720 hour(s))  Culture, blood (Routine X 2) w Reflex to ID Panel     Status: None   Collection Time: 11/06/15  8:27 AM  Result Value Ref Range Status   Specimen Description BLOOD RIGHT HAND  Final   Special Requests BOTTLES DRAWN AEROBIC AND ANAEROBIC 3CC  Final   Culture NO GROWTH 5 DAYS  Final   Report Status 11/11/2015 FINAL  Final  Culture, blood (Routine X 2) w Reflex to ID Panel     Status: None   Collection Time: 11/06/15  8:29 AM  Result Value Ref Range Status   Specimen Description BLOOD LEFT HAND  Final   Special Requests BOTTLES DRAWN AEROBIC AND ANAEROBIC  1CC  Final   Culture NO GROWTH 5 DAYS  Final   Report Status 11/11/2015 FINAL  Final  Culture, expectorated sputum-assessment     Status: None   Collection Time: 11/06/15  8:41 AM  Result Value Ref Range Status   Specimen Description EXPECTORATED  SPUTUM  Final   Special Requests Immunocompromised  Final   Sputum evaluation THIS SPECIMEN IS ACCEPTABLE FOR SPUTUM CULTURE  Final   Report Status 11/06/2015 FINAL  Final  Culture, respiratory (NON-Expectorated)     Status: None   Collection Time: 11/06/15  8:41 AM  Result Value Ref Range Status   Specimen Description EXPECTORATED SPUTUM  Final   Special Requests Immunocompromised Reflexed from [id number]  Final   Gram Stain   Final    MANY WBC SEEN MANY GRAM NEGATIVE RODS EXCELLENT SPECIMEN - 90-100% WBCS    Culture   Final    MODERATE GROWTH KLEBSIELLA PNEUMONIAE MODERATE GROWTH PSEUDOMONAS AERUGINOSA CRITICAL RESULT CALLED TO, READ BACK BY AND VERIFIED WITH: DR. FITZGERALD AT 1120 11/09/15 DV DR. FITZGERALD REQUESTED ORGANISM BE SENT OUT FOR TIGECYCLINE CEFT/AVIBACTAM AND COLISTIN Sent to [facility] for further susceptibility testing. 11/11/15 CTJ    Report Status 11/15/2015 FINAL  Final   Organism ID, Bacteria KLEBSIELLA PNEUMONIAE  Final   Organism ID, Bacteria PSEUDOMONAS AERUGINOSA  Final      Susceptibility   Klebsiella pneumoniae - MIC*    AMPICILLIN >=32 RESISTANT Resistant     CEFAZOLIN >=64 RESISTANT Resistant     CEFTRIAXONE 32 RESISTANT Resistant     CIPROFLOXACIN >=4 RESISTANT Resistant     GENTAMICIN >=16 RESISTANT Resistant     IMIPENEM >=16 RESISTANT Resistant     NITROFURANTOIN 256 RESISTANT Resistant     TRIMETH/SULFA >=320 RESISTANT Resistant  Extended ESBL POSITIVE Resistant     * MODERATE GROWTH KLEBSIELLA PNEUMONIAE   Pseudomonas aeruginosa - MIC*    CEFTAZIDIME >=64 RESISTANT Resistant     CIPROFLOXACIN 2 INTERMEDIATE Intermediate     GENTAMICIN >=16 RESISTANT Resistant     IMIPENEM >=16 RESISTANT Resistant     * MODERATE GROWTH PSEUDOMONAS AERUGINOSA  Culture, respiratory (NON-Expectorated)     Status: None   Collection Time: 11/14/15  2:07 PM  Result Value Ref Range Status   Specimen Description TRACHEAL ASPIRATE  Final   Special Requests NONE   Final   Gram Stain   Final    EXCELLENT SPECIMEN - 90-100% WBCS MANY WBC SEEN MODERATE GRAM NEGATIVE RODS    Culture   Final    HEAVY GROWTH PSEUDOMONAS AERUGINOSA CRITICAL VALUE NOTED.  VALUE IS CONSISTENT WITH PREVIOUSLY REPORTED AND CALLED VALUE.    Report Status 11/16/2015 FINAL  Final   Organism ID, Bacteria PSEUDOMONAS AERUGINOSA  Final      Susceptibility   Pseudomonas aeruginosa - MIC*    CEFTAZIDIME >=64 RESISTANT Resistant     CIPROFLOXACIN 2 INTERMEDIATE Intermediate     GENTAMICIN >=16 RESISTANT Resistant     IMIPENEM >=16 RESISTANT Resistant     PIP/TAZO Value in next row Resistant      RESISTANT>=128    TOBRAMYCIN Value in next row Sensitive      SENSITIVE2    * HEAVY GROWTH PSEUDOMONAS AERUGINOSA    Medical History: Past Medical History  Diagnosis Date  . Obesity   . Dyslipidemia   . Hypertension   . Coronary artery disease     s/p BMS 2010 LAD  . Dysrhythmia     ventricular tachycardia resolved after LAD stent and beta blocker  . Diabetes mellitus     with retinopathy, neuropathy and microalbuminemia  . 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 Patient is on day 105 of hospital stay, and has been treated for multiple infections during this time.   11/06/15: Respiratory cultures showing Pan-resistant  Klebsiella pneumoniae and Pseudomonas aeruginosa  Sputum has been sent to [facility] for susceptibility testing per Dr. Blane Ohara request.   Patient has increased respiratory secretions, leukocytosis and low grade fever   12/30: Alk phos elevated at 1309  Plan:  Per microbiology, Klebsiella is sensitive to Tygacil. Sensitivity results for Avycaz and colistin are still pending.   Continue tigecycline 75m IV q12h.   Continue  colistimethate 2566m(~2.5 mg/kg) IV q48 hours.   Patient should be monitored for nephrotoxicity  and neurotoxicity.  Pharmacy to continue to follow per consult.      TeLarene BeachPharmD  Clinical Pharmacist   11/26/2015 9:54 AM

## 2015-11-26 NOTE — Progress Notes (Signed)
MEDICATION RELATED CONSULT NOTE - Follow up   Pharmacy Consult for Renal dosing  Allergies  Allergen Reactions  . Contrast Media [Iodinated Diagnostic Agents] Anaphylaxis  . Ampicillin Rash   Patient Measurements: Ht 405ft 9in Height:  (SCALE BROKE) Weight: 224 lb 13.9 oz (102 kg) IBW/kg (Calculated) : 66.2  Vital Signs: Temp: 98.7 F (37.1 C) (01/07 0400) Temp Source: Axillary (01/07 0400) BP: 123/74 mmHg (01/07 0500) Pulse Rate: 80 (01/07 0500)  Recent Labs  11/23/15 1339 11/25/15 0938  WBC 10.6 12.8*  HGB 9.8* 9.0*  HCT 31.7* 29.2*  PLT 119* 99*  CREATININE 1.45* 1.13*  PHOS 5.2* 4.9*  ALBUMIN 1.5* 1.5*   Estimated Creatinine Clearance: 75.7 mL/min (by C-G formula based on Cr of 1.13).  Assessment: 50 yo patient on HD. Pharmacy consulted for renal dosing of medications.   Plan:  No medications require adjustment at present. Pharmacy will continue to monitor and dose adjust as needed.     Demetrius Charityeldrin D. Carol Theys, PharmD   11/26/2015

## 2015-11-26 NOTE — Progress Notes (Signed)
PULMONARY / CRITICAL CARE MEDICINE   Name: Courtney [name] MRN: [id number] DOB: 1966/11/04    ADMISSION DATE:  07/23/2015  BRIEF HISTORY: 49 AAF who has been in medical facilities (hosp, LTAC, rehab) for 2 yrs following gastric bypass surgery with multiple complications. Now with chronic trach, ESRD, profound debilitation, severe sacral pressure ulcer. Was seemingly making progress and transferred to rehab facility approx one week prior to this admission. She was sent to [facility] ED with AMS and hypotension. Working dx of severe sepsis/septic shock due to infected sacral pressure ulcer. Since admission. Her course is been very complicated with numerous complications including septic shock and GI bleeding. Now with failure to wean from vent and possible MI event, repeat ECHO EF 30-35, possible old MI, severe hypokinesis of the nnterior and anteroseptal myocardium   SUBJ: [name] is lethargic today, she cannot provide a history, she is arousable to physical stimulation.  Summary of MAJOR EVENTS/TEST RESULTS: Admission 02/07/14-05/07/14 Admission 07/21/14-09/06/14 Discharged to [facility]. Pt had palliative consult at that time, were asked to sign off by husband.  09/23 CT head: NAD 09/23 EEG: no epileptiform activity 09/23 PRBCs for Hgb 6.4 09/24 bedside debridement of sacral wound. Abscess drained 09/25 Off vasopressors. More alert. No distress. Worsening thrombocytopenia. Vanc DC'd 09/29 Dr. Sampson Goon (I.D) excused from the case by patient's husband. 10/02 MRI -multiple infarcts 10/03 tracheal bleeding- transfused platelets 10/03 hospitalist service excused from the case by patient's husband 10/03 Echocardiogram ejection fraction was 55-60%, pulmonary systolic pressure was 39 mmHg 40/98 restart TF's at lower rate, attempt reg HD  10/12 Transferred to med-surg floor. Remains on PCCM service 10/14 SLP eval: pt unable to tolerate PMV adequately 10/17-will re-attempt PM valve-discussed with  Speech therapist 10/18 passed swallow eval-start pureed thick foods no thin liquids-continue NG feeds 10/19 transferred to step down for sepsis/aspiration pneumonia 10/19 cxr shows RLL opacity 10/21 sacral decub debride at bedside by surgery 10/22 started back on vasopressors while on HD 10/27 CT with osteo, R hip fx, unable to identify tip of dubhoff tube - sent for fluoro study 10/28 Ortho consultation: I do not feel that she is a surgical candidate. Therefore, I feel that it would best to manage this fracture nonsurgically and allow it to heal by itself over time, which it should.  10/28 Gen Surg consultation: Due to the lack of free air or free flow of contrast and the peritoneum there is no indication for any surgical intervention on this. Would recommend pulling back the feeding tube 1-2 cm. Would recheck an abdominal film to confirm no pneumoperitoneum in the morning. Absence of any changes okay to continue using Dobbhoff for feeding and medications. 10/28 gastrograffin study: The study confirms that the feeding tube pes perforated through the duodenum and the tip is within a cavity that fills with injected contrast. The cavity does appear walled off 11/4 refuses oral feeds, continue TF's CT reviewed: T8-T9 discitis/osteomyelitis, RT HIP FRACTURE 11/5 placed back on vasopressors, placed back on Vent due to resp acidosis, levophed turned off 11/6 afternoon 11/5 PM Trach changed out due to cuff leak, #6 Shiley cuffed, 11/6 dubhoff occluded, removed and replaced, new dubhoff shows tube in the antral stomach 11/15 refusing to take oral feeds 11/18 placed back on Vent for increased WOB,SOB. 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 10/18/2015; the patient developed a new left basal pneumonia; with recurrent sepsis.  10/19/2015; fevers resolved with IV  acetaminophen. Tube feeds changed from vital to Jevity.  12/7  CXR with stable b/l opacities.  12/12 elevated K s/p HD-remains on AC mode 12/16-vomiting-hold feeds and re-assess in next 24 hrs 12/17- TF restarted at 1/2 goal (goal = 50cc/per) 12/18- elevated wbc, fever (101.4), recultured, restarted on ceftaz 12/19 Pericardial friction rub. Echocardiogram: LVEF 30-35%, anteroseptal hypokinesis or akinesis c/w anterior wall MI 12/20 Dr Sung Amabile discussed new echocardiogram findings with pt's husband and explained that this likely represents an anterior wall MI sometime in the previous couple of weeks. It was explained that she is not a candidate for any cardiology intervention. Code status was again discussed. Husband expressed understanding that she was not going to survive this illness in any favorable way but continues to request that she be full code status based on what he believes to be her wishes 12/21 vomitied 50cc of TF - AM TF held, restart TF at 3pm @30cc  12/26 Persistent intermittent trach cuff leak. Distal XLT trach requested.  12/27 15 beat NSVT 12/28 patient refused HD and refused Bath time 12/29 D10 infusion for hypoglycemia, TF's on hold due to intolerance 12/30 CODE BLUE  VTACH 01/01 transfuse 2 units of blood 01/02 increased respiratory distress, copious purulent secretions 01/02 LE venous US: No DVT 01/03 Repeat TTE: The cavity size was mildly dilated. Systolic function was moderately to severely reduced. The estimated ejection fraction was in the range of 30% to 35%. Diffuse hypokinesis. Moderate to severe hypokinesis of the anteroseptal, anterior, and apical myocardium. Hypokinesis of the anterior myocardium 01/05 Hypothermic - Bair Hugger ordered  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 History of sepsis from C.  glabrata MRSA PCR 9/23 >> NEG Wound (swab) 9/23 >> multiple organisms Wound (debridement) 9/24 >> Enterococcus, K. Pneumoniae, P. Mirabilis, VRE Wound 9/26 >> No growth Blood 9/23 >> NEG CDiff 9/27>>neg Stool Cx 10/15>> negative Cdiff 10/25>>neg Trach Aspirate 10/28>> light growth pseudomonas Blood 10/28 >> 1/2 GPC >>  Sputum cultures obtained 09/22/15 due to mucus plugging>>Pseudomonas BONE TISSUE Cx 11/3>>E.coli (ESBL), k. Pneumonia (daptomycin and septra) Bld Cx 11/27>>negative thus far.  Trach Asp 11/27>> grew out Pseudomonas, and Klebsiella pneumonia. Trach Asp 12/18>> MDR Klebsiella, Pseudomonas Bld Cx 12/18>> NEG 12/26 resp culture >> MDR pseudomonas  ANTIMICROBIALS:  Aztreonam 9/23 >> 9/24 Vanc 9/23 >> 9/25 Vanc 9/26>>9/27 Daptomycin 9/27>> 10/12, 10/28>> off Meropenem 9/23 >> 10/14, 10/28 >> 10/31 Zosyn 11/27,>> 11/30. Levaquin 11/29>> 11/29. Ceftaz 12/1>>12/11, 12/18>> 12/24 Colisitin 12/26 >>  Tigecycline 12/26 >>  Nebulized tobramycin 12/30 >>  VITAL SIGNS: Temp:  [97.4 F (36.3 C)-99.1 F (37.3 C)] 98.5 F (36.9 C) (01/07 1200) Pulse Rate:  [32-83] 40 (01/07 1445) Resp:  [18-29] 18 (01/07 1445) BP: (88-142)/(36-113) 126/51 mmHg (01/07 1445) SpO2:  [95 %-100 %] 99 % (01/07 1445) FiO2 (%):  [40 %] 40 % (01/07 1430) HEMODYNAMICS:   VENTILATOR SETTINGS: Vent Mode:  [-] PRVC FiO2 (%):  [40 %] 40 % Set Rate:  [14 bmp] 14 bmp Vt Set:  [500 mL] 500 mL PEEP:  [5 cmH20] 5 cmH20 Plateau Pressure:  [17 cmH20-31 cmH20] 31 cmH20 INTAKE / OUTPUT:  Intake/Output Summary (Last 24 hours) at 11/26/15 1552 Last data filed at 11/26/15 1405  Gross per 24 hour  Intake 1961.5 ml  Output      0 ml  Net 1961.5 ml    Review of Systems  Unable to perform ROS  Physical Exam  RASS 0, intermittent trach tube cuff leak continues today,  HEENT; Cushingoid facies, temporal wasting, NGT present. Tube feeds are infusing. Trach site clean Diffuse rhonchi Reg with occ  extrasystoles, no M Abdomen soft, + BS Ext cool, trace symmetric edema, marked muscle wasting Severe sacral decubitus ulcer-   LABS:  CBC  Recent Labs Lab 11/22/15 0925 11/23/15 1339 11/25/15 0938  WBC 18.1* 10.6 12.8*  HGB 9.6* 9.8* 9.0*  HCT 30.7* 31.7* 29.2*  PLT 153 119* 99*   Coag's  Recent Labs Lab 11/21/15 2012  APTT 41*  INR 1.57   BMET  Recent Labs Lab 11/21/15 1318 11/23/15 1339 11/25/15 0938  NA 131* 131* 133*  K 3.6 3.8 3.5  CL 96* 96* 97*  CO2 28 29 27   BUN 72* 72* 72*  CREATININE 1.46* 1.45* 1.13*  GLUCOSE 117* 101* 78   Electrolytes  Recent Labs Lab 11/21/15 0147 11/21/15 1318 11/23/15 1339 11/25/15 0938  CALCIUM 8.0* 8.0* 8.1* 7.8*  MG 2.1 2.1  --   --   PHOS  --   --  5.2* 4.9*   Sepsis Markers No results for input(s): LATICACIDVEN, PROCALCITON, O2SATVEN in the last 168 hours. ABG  Recent Labs Lab 11/21/15 1745  PHART 7.41  PCO2ART 47  PO2ART 78*   Liver Enzymes  Recent Labs Lab 11/23/15 1339 11/25/15 0938  ALBUMIN 1.5* 1.5*   Cardiac Enzymes  Recent Labs Lab 11/21/15 2012 11/22/15 0202 11/22/15 0925  TROPONINI 0.05* 0.16* 0.06*   Glucose  Recent Labs Lab 11/24/15 1612 11/25/15 0055 11/25/15 0746 11/25/15 1604 11/25/15 2342 11/26/15 0816  GLUCAP 83 77 89 90 107* 106*    Imaging No results found.  ASSESSMENT / PLAN: IMPRESSION: Very prolonged and complicated hospitalization after gastric bypass surgery 2014 Never returned home ESRD Recurrent severe sepsis - PAN resistant Klebisella in sputum Recurrent PNA-now with pseudomonas H/O C diff H/O VTE DM2 - controlled Episodic hypoglycemia Chronic steroid therapy, for a history of of adrenal insufficiency Incidental finding of R hip fx - conservative mgmt. 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 Husband declined TEE Profound deconditioning Chronic pain New finding of pericardial friction rub 12/19 - resolved New finding of cardiomyopathy, likely post MI 12/19  PAF/flutter Sinus bradycardia HCAP with MDR Donalee CitrinKlebs and pseudomonas Chronic anemia Severe protein - calorie malnutrition TF intolerance Mild thrombocytopenia 01/06  Cont vent support - settings reviewed and/or adjusted Cont vent bundle Cont HD per renal Monitor BMET intermittently Monitor I/Os Correct electrolytes as indicated DVT px: SQ heparin Monitor CBC intermittently Transfuse per usual guidelines Resume TF's at low rate Monitor temp, WBC count Micro and abx as above ID following - appreciate recs Cont Amiodarone - 100 mg per tube daily DC metoprolol Cont opiates PRN Cont D10 Infusion for hypoglycemia Resume TFs @ 10 cc/hr   Prognosis remains very poor without hope for functional recovery.   The above time includes time spent in consultation with patient and/or family members and reviewing care plan on multidisciplinary rounds    Shane CrutchPradeep Vana Arif, MD [facility]       Critical Care Attestation.  I have personally obtained a history, examined the patient, evaluated laboratory and imaging results,  formulated the assessment and plan and placed orders. The Patient requires high complexity decision making for assessment and support, frequent evaluation and titration of therapies, application of advanced monitoring technologies and extensive interpretation of multiple databases. The patient has critical illness that could lead imminently to failure of 1 or more organ systems and requires the highest level of physician preparedness to intervene.  Critical Care Time devoted to patient care services described in this note is 35 minutes and is exclusive of time spent in procedures.

## 2015-11-26 NOTE — Progress Notes (Signed)
Subjective:   Hemodialysis yesterday. Tolerated treatment well. UF 2 litres  Objective:  Vital signs in last 24 hours:  Temp:  [97.4 F (36.3 C)-99.1 F (37.3 C)] 98.5 F (36.9 C) (01/07 1200) Pulse Rate:  [32-83] 40 (01/07 1445) Resp:  [18-29] 18 (01/07 1445) BP: (88-142)/(36-113) 126/51 mmHg (01/07 1445) SpO2:  [95 %-100 %] 99 % (01/07 1445) FiO2 (%):  [40 %] 40 % (01/07 1430)  Weight change:  Filed Weights   11/21/15 0500 11/21/15 1540 11/22/15 0500  Weight: 107 kg (235 lb 14.3 oz) 107 kg (235 lb 14.3 oz) 102 kg (224 lb 13.9 oz)    Intake/Output: I/O last 3 completed shifts: In: 2404 [I.V.:900; NG/GT:904; IV Piggyback:600] Out: -2000    Intake/Output this shift:  Total I/O In: 795 [I.V.:175; NG/GT:520; IV Piggyback:100] Out: -   Physical Exam: General: critically ill appearing  Head/ENT: OM moist, NG tube  Eyes: Anicteric, eyes closed  Neck: Tracheostomy in place  Lungs:  bilatearl rhonchi, vent assisted PRVC fio2 40%  Heart: S1S2 no rubs  Abdomen:  Soft, nontender, BS present  Extremities: 2+ dependent edema, anasarca  Neurologic: Resting comfortably at the moment  Access:  R IJ permcath.       Basic Metabolic Panel:  Recent Labs Lab 11/21/15 0147 11/21/15 0434 11/21/15 1318 11/23/15 1339 11/25/15 0938  NA 133*  --  131* 131* 133*  K 3.6  --  3.6 3.8 3.5  CL 97*  --  96* 96* 97*  CO2 28  --  28 29 27   GLUCOSE 133*  --  117* 101* 78  BUN 67*  --  72* 72* 72*  CREATININE 1.25* 1.42* 1.46* 1.45* 1.13*  CALCIUM 8.0*  --  8.0* 8.1* 7.8*  MG 2.1  --  2.1  --   --   PHOS  --   --   --  5.2* 4.9*    Liver Function Tests:  Recent Labs Lab 11/23/15 1339 11/25/15 0938  ALBUMIN 1.5* 1.5*   No results for input(s): LIPASE, AMYLASE in the last 168 hours. No results for input(s): AMMONIA in the last 168 hours.  CBC:  Recent Labs Lab 11/20/15 0948 11/21/15 1026 11/21/15 2012 11/22/15 0925 11/23/15 1339 11/25/15 0938  WBC 20.7* 19.9* 24.0*  18.1* 10.6 12.8*  NEUTROABS 18.2* 17.1*  --   --   --   --   HGB 7.3* 9.7* 10.1* 9.6* 9.8* 9.0*  HCT 24.8* 30.8* 32.1* 30.7* 31.7* 29.2*  MCV 94.0 91.5 91.2 90.5 91.1 91.8  PLT 229 186 170 153 119* 99*    Cardiac Enzymes:  Recent Labs Lab 11/21/15 2012 11/22/15 0202 11/22/15 0925  TROPONINI 0.05* 0.16* 0.06*    BNP: Invalid input(s): POCBNP  CBG:  Recent Labs Lab 11/25/15 0055 11/25/15 0746 11/25/15 1604 11/25/15 2342 11/26/15 0816  GLUCAP 77 89 90 107* 106*    Microbiology: Results for orders placed or performed during the hospital encounter of 08-20-2015  Blood Culture (routine x 2)     Status: None   Collection Time: August 20, 2015  8:51 AM  Result Value Ref Range Status   Specimen Description BLOOD [name]  Final   Special Requests BOTTLES DRAWN AEROBIC AND ANAEROBIC  3CC  Final   Culture NO GROWTH 5 DAYS  Final   Report Status 08/17/2015 FINAL  Final  Blood Culture (routine x 2)     Status: None   Collection Time: 08/20/15  9:20 AM  Result Value Ref Range Status   Specimen  Description BLOOD LEFT ARM  Final   Special Requests BOTTLES DRAWN AEROBIC AND ANAEROBIC  1CC  Final   Culture NO GROWTH 5 DAYS  Final   Report Status 08/17/2015 FINAL  Final  Wound culture     Status: None   Collection Time: 2015-08-27  9:20 AM  Result Value Ref Range Status   Specimen Description DECUBITIS  Final   Special Requests Normal  Final   Gram Stain   Final    FEW WBC SEEN MANY GRAM NEGATIVE RODS RARE GRAM POSITIVE COCCI    Culture   Final    HEAVY GROWTH ESCHERICHIA COLI MODERATE GROWTH ENTEROBACTER AEROGENES PROTEUS MIRABILIS HEAVY GROWTH ENTEROCOCCUS SPECIES VRE HAVE INTRINSIC RESISTANCE TO MOST COMMONLY USED ANTIBIOTICS AND THE ABILITY TO ACQUIRE RESISTANCE TO MOST AVAILABLE ANTIBIOTICS.    Report Status 08/16/2015 FINAL  Final   Organism ID, Bacteria ESCHERICHIA COLI  Final   Organism ID, Bacteria ENTEROBACTER AEROGENES  Final   Organism ID, Bacteria PROTEUS MIRABILIS   Final   Organism ID, Bacteria ENTEROCOCCUS SPECIES  Final      Susceptibility   Enterobacter aerogenes - MIC*    CEFTAZIDIME <=1 SENSITIVE Sensitive     CEFAZOLIN >=64 RESISTANT Resistant     CEFTRIAXONE <=1 SENSITIVE Sensitive     CIPROFLOXACIN <=0.25 SENSITIVE Sensitive     GENTAMICIN <=1 SENSITIVE Sensitive     IMIPENEM 1 SENSITIVE Sensitive     TRIMETH/SULFA <=20 SENSITIVE Sensitive     * MODERATE GROWTH ENTEROBACTER AEROGENES   Escherichia coli - MIC*    AMPICILLIN <=2 SENSITIVE Sensitive     CEFTAZIDIME <=1 SENSITIVE Sensitive     CEFAZOLIN <=4 SENSITIVE Sensitive     CEFTRIAXONE <=1 SENSITIVE Sensitive     CIPROFLOXACIN <=0.25 SENSITIVE Sensitive     GENTAMICIN <=1 SENSITIVE Sensitive     IMIPENEM <=0.25 SENSITIVE Sensitive     TRIMETH/SULFA <=20 SENSITIVE Sensitive     * HEAVY GROWTH ESCHERICHIA COLI   Proteus mirabilis - MIC*    AMPICILLIN >=32 RESISTANT Resistant     CEFTAZIDIME <=1 SENSITIVE Sensitive     CEFAZOLIN 8 SENSITIVE Sensitive     CEFTRIAXONE <=1 SENSITIVE Sensitive     CIPROFLOXACIN <=0.25 SENSITIVE Sensitive     GENTAMICIN <=1 SENSITIVE Sensitive     IMIPENEM 1 SENSITIVE Sensitive     TRIMETH/SULFA <=20 SENSITIVE Sensitive     * PROTEUS MIRABILIS   Enterococcus species - MIC*    AMPICILLIN >=32 RESISTANT Resistant     VANCOMYCIN >=32 RESISTANT Resistant     GENTAMICIN SYNERGY SENSITIVE Sensitive     TETRACYCLINE Value in next row Resistant      RESISTANT>=16    * HEAVY GROWTH ENTEROCOCCUS SPECIES  MRSA PCR Screening     Status: None   Collection Time: 2015-08-27  2:38 PM  Result Value Ref Range Status   MRSA by PCR NEGATIVE NEGATIVE Final    Comment:        The GeneXpert MRSA Assay (FDA approved for NASAL specimens only), is one component of a comprehensive MRSA colonization surveillance program. It is not intended to diagnose MRSA infection nor to guide or monitor treatment for MRSA infections.   Blood culture (single)     Status: None    Collection Time: 08/27/2015  3:36 PM  Result Value Ref Range Status   Specimen Description BLOOD RIGHT ASSIST CONTROL  Final   Special Requests BOTTLES DRAWN AEROBIC AND ANAEROBIC  Final   Culture NO GROWTH 5  DAYS  Final   Report Status 08/17/2015 FINAL  Final  Wound culture     Status: None   Collection Time: 08/13/15 12:37 PM  Result Value Ref Range Status   Specimen Description WOUND  Final   Special Requests Normal  Final   Gram Stain   Final    FEW WBC SEEN TOO NUMEROUS TO COUNT GRAM NEGATIVE RODS FEW GRAM POSITIVE COCCI    Culture   Final    HEAVY GROWTH ESCHERICHIA COLI MODERATE GROWTH PROTEUS MIRABILIS LIGHT GROWTH KLEBSIELLA PNEUMONIAE MODERATE GROWTH ENTEROCOCCUS GALLINARUM CRITICAL RESULT CALLED TO, READ BACK BY AND VERIFIED WITH: [facility] BORBA AT 1042 08/16/15 DV    Report Status 08/17/2015 FINAL  Final   Organism ID, Bacteria ESCHERICHIA COLI  Final   Organism ID, Bacteria PROTEUS MIRABILIS  Final   Organism ID, Bacteria KLEBSIELLA PNEUMONIAE  Final   Organism ID, Bacteria ENTEROCOCCUS GALLINARUM  Final      Susceptibility   Escherichia coli - MIC*    AMPICILLIN >=32 RESISTANT Resistant     CEFTAZIDIME 4 RESISTANT Resistant     CEFAZOLIN >=64 RESISTANT Resistant     CEFTRIAXONE 16 RESISTANT Resistant     GENTAMICIN 2 SENSITIVE Sensitive     IMIPENEM >=16 RESISTANT Resistant     TRIMETH/SULFA <=20 SENSITIVE Sensitive     Extended ESBL POSITIVE Resistant     PIP/TAZO Value in next row Resistant      RESISTANT>=128    CIPROFLOXACIN Value in next row Sensitive      SENSITIVE<=0.25    * HEAVY GROWTH ESCHERICHIA COLI   Klebsiella pneumoniae - MIC*    AMPICILLIN Value in next row Resistant      SENSITIVE<=0.25    CEFTAZIDIME Value in next row Resistant      SENSITIVE<=0.25    CEFAZOLIN Value in next row Resistant      SENSITIVE<=0.25    CEFTRIAXONE Value in next row Resistant      SENSITIVE<=0.25    CIPROFLOXACIN Value in next row Resistant       SENSITIVE<=0.25    GENTAMICIN Value in next row Sensitive      SENSITIVE<=0.25    IMIPENEM Value in next row Resistant      SENSITIVE<=0.25    TRIMETH/SULFA Value in next row Resistant      SENSITIVE<=0.25    PIP/TAZO Value in next row Resistant      RESISTANT>=128    * LIGHT GROWTH KLEBSIELLA PNEUMONIAE   Proteus mirabilis - MIC*    AMPICILLIN Value in next row Resistant      RESISTANT>=128    CEFTAZIDIME Value in next row Sensitive      RESISTANT>=128    CEFAZOLIN Value in next row Sensitive      RESISTANT>=128    CEFTRIAXONE Value in next row Sensitive      RESISTANT>=128    CIPROFLOXACIN Value in next row Sensitive      RESISTANT>=128    GENTAMICIN Value in next row Sensitive      RESISTANT>=128    IMIPENEM Value in next row Sensitive      RESISTANT>=128    TRIMETH/SULFA Value in next row Sensitive      RESISTANT>=128    PIP/TAZO Value in next row Sensitive      SENSITIVE<=4    * MODERATE GROWTH PROTEUS MIRABILIS   Enterococcus gallinarum - MIC*    AMPICILLIN Value in next row Resistant      SENSITIVE<=4    GENTAMICIN SYNERGY Value in next  row Sensitive      SENSITIVE<=4    CIPROFLOXACIN Value in next row Resistant      RESISTANT>=8    TETRACYCLINE Value in next row Resistant      RESISTANT>=16    * MODERATE GROWTH ENTEROCOCCUS GALLINARUM  Wound culture     Status: None   Collection Time: 08/15/15  2:53 PM  Result Value Ref Range Status   Specimen Description WOUND  Final   Special Requests NONE  Final   Gram Stain FEW WBC SEEN NO ORGANISMS SEEN   Final   Culture NO GROWTH 3 DAYS  Final   Report Status 08/18/2015 FINAL  Final  C difficile quick scan w PCR reflex     Status: None   Collection Time: 08/17/15 11:34 AM  Result Value Ref Range Status   C Diff antigen NEGATIVE NEGATIVE Final   C Diff toxin NEGATIVE NEGATIVE Final   C Diff interpretation Negative for C. difficile  Final  Stool culture     Status: None   Collection Time: 09/03/15  3:51 PM  Result  Value Ref Range Status   Specimen Description STOOL  Final   Special Requests Immunocompromised  Final   Culture   Final    NO SALMONELLA OR SHIGELLA ISOLATED No Pathogenic E. coli detected NO CAMPYLOBACTER DETECTED    Report Status 09/07/2015 FINAL  Final  C difficile quick scan w PCR reflex     Status: None   Collection Time: 09/13/15 12:51 AM  Result Value Ref Range Status   C Diff antigen NEGATIVE NEGATIVE Final   C Diff toxin NEGATIVE NEGATIVE Final   C Diff interpretation Negative for C. difficile  Final  Culture, blood (routine x 2)     Status: None   Collection Time: 09/16/15 11:24 AM  Result Value Ref Range Status   Specimen Description BLOOD LEFT HAND  Final   Special Requests BOTTLES DRAWN AEROBIC AND ANAEROBIC  1CC  Final   Culture NO GROWTH 5 DAYS  Final   Report Status 09/21/2015 FINAL  Final  Culture, blood (routine x 2)     Status: None   Collection Time: 09/16/15 12:23 PM  Result Value Ref Range Status   Specimen Description BLOOD RIGHT HAND  Final   Special Requests BOTTLES DRAWN AEROBIC AND ANAEROBIC  1CC  Final   Culture  Setup Time   Final    GRAM POSITIVE COCCI AEROBIC BOTTLE ONLY CRITICAL RESULT CALLED TO, READ BACK BY AND VERIFIED WITH: TESS THOMAS,RN 09/17/2015 0631 BY JRS.    Culture   Final    ENTEROCOCCUS FAECALIS AEROBIC BOTTLE ONLY VRE HAVE INTRINSIC RESISTANCE TO MOST COMMONLY USED ANTIBIOTICS AND THE ABILITY TO ACQUIRE RESISTANCE TO MOST AVAILABLE ANTIBIOTICS. CRITICAL RESULT CALLED TO, READ BACK BY AND VERIFIED WITH: CHERYL SMITH AT 1610 09/19/15 DV    Report Status 09/21/2015 FINAL  Final   Organism ID, Bacteria ENTEROCOCCUS FAECALIS  Final      Susceptibility   Enterococcus faecalis - MIC*    AMPICILLIN <=2 SENSITIVE Sensitive     LINEZOLID 2 SENSITIVE Sensitive     CIPROFLOXACIN Value in next row Resistant      RESISTANT>=8    TETRACYCLINE Value in next row Resistant      RESISTANT>=16    VANCOMYCIN Value in next row Resistant       RESISTANT>=32    GENTAMICIN SYNERGY Value in next row Resistant      RESISTANT>=32    * ENTEROCOCCUS FAECALIS  Culture, respiratory (NON-Expectorated)     Status: None   Collection Time: 09/16/15  3:50 PM  Result Value Ref Range Status   Specimen Description TRACHEAL ASPIRATE  Final   Special Requests Immunocompromised  Final   Gram Stain   Final    FEW WBC SEEN GOOD SPECIMEN - 80-90% WBCS RARE GRAM NEGATIVE RODS    Culture LIGHT GROWTH PSEUDOMONAS AERUGINOSA  Final   Report Status 09/19/2015 FINAL  Final   Organism ID, Bacteria PSEUDOMONAS AERUGINOSA  Final      Susceptibility   Pseudomonas aeruginosa - MIC*    CEFTAZIDIME 8 SENSITIVE Sensitive     CIPROFLOXACIN 2 INTERMEDIATE Intermediate     GENTAMICIN >=16 RESISTANT Resistant     IMIPENEM >=16 RESISTANT Resistant     PIP/TAZO Value in next row Sensitive      SENSITIVE32    CEFEPIME Value in next row Sensitive      SENSITIVE8    LEVOFLOXACIN Value in next row Resistant      RESISTANT>=8    * LIGHT GROWTH PSEUDOMONAS AERUGINOSA  Culture, expectorated sputum-assessment     Status: None   Collection Time: 09/22/15  2:09 PM  Result Value Ref Range Status   Specimen Description ENDOTRACHEAL  Final   Special Requests Normal  Final   Sputum evaluation THIS SPECIMEN IS ACCEPTABLE FOR SPUTUM CULTURE  Final   Report Status 09/24/2015 FINAL  Final  Culture, respiratory (NON-Expectorated)     Status: None   Collection Time: 09/22/15  2:09 PM  Result Value Ref Range Status   Specimen Description ENDOTRACHEAL  Final   Special Requests Normal Reflexed from [id number]  Final   Gram Stain   Final    FEW WBC SEEN FEW GRAM NEGATIVE RODS POOR SPECIMEN - LESS THAN 70% WBCS    Culture   Final    MODERATE GROWTH PSEUDOMONAS AERUGINOSA LIGHT GROWTH KLEBSIELLA PNEUMONIAE REFER TO SENSITIVITIES FROM PREVIOUS CULTURE FOR ORG 2    Report Status 10/01/2015 FINAL  Final   Organism ID, Bacteria PSEUDOMONAS AERUGINOSA  Final   Organism ID,  Bacteria KLEBSIELLA PNEUMONIAE  Final      Susceptibility   Pseudomonas aeruginosa - MIC*    CEFTAZIDIME 4 SENSITIVE Sensitive     CIPROFLOXACIN 2 INTERMEDIATE Intermediate     GENTAMICIN >=16 RESISTANT Resistant     IMIPENEM >=16 RESISTANT Resistant     PIP/TAZO Value in next row Sensitive      SENSITIVE16    * MODERATE GROWTH PSEUDOMONAS AERUGINOSA  Tissue culture     Status: None   Collection Time: 09/24/15  6:44 AM  Result Value Ref Range Status   Specimen Description BONE  Final   Special Requests Normal  Final   Gram Stain MODERATE WBC SEEN FEW GRAM NEGATIVE RODS   Final   Culture   Final    MODERATE GROWTH ESCHERICHIA COLI MODERATE GROWTH KLEBSIELLA PNEUMONIAE ESBL-EXTENDED SPECTRUM BETA LACTAMASE-THE ORGANISM IS RESISTANT TO PENICILLINS, CEPHALOSPORINS AND AZTREONAM ACCORDING TO CLSI M100-S15 VOL.25 N01 JAN 2005. ORGANISM 1 This organism isolate is resistant to one or more antiotic agents in three or more antimicrobial categories.  Suggest Infectious Disease consult.   ORGANISM 2 CRITICAL RESULT CALLED TO, READ BACK BY AND VERIFIED WITH: RN Mardene Celeste LINDSAY 09/27/15 1005AM    Report Status 09/28/2015 FINAL  Final   Organism ID, Bacteria ESCHERICHIA COLI  Final   Organism ID, Bacteria KLEBSIELLA PNEUMONIAE  Final      Susceptibility   Escherichia coli - MIC*  AMPICILLIN >=32 RESISTANT Resistant     CEFTAZIDIME 4 RESISTANT Resistant     CEFAZOLIN >=64 RESISTANT Resistant     CEFTRIAXONE 8 RESISTANT Resistant     GENTAMICIN <=1 SENSITIVE Sensitive     IMIPENEM 8 RESISTANT Resistant     TRIMETH/SULFA <=20 SENSITIVE Sensitive     Extended ESBL POSITIVE Resistant     PIP/TAZO Value in next row Resistant      RESISTANT>=128    * MODERATE GROWTH ESCHERICHIA COLI   Klebsiella pneumoniae - MIC*    AMPICILLIN Value in next row Resistant      RESISTANT>=128    CEFTAZIDIME Value in next row Resistant      RESISTANT>=128    CEFAZOLIN Value in next row Resistant       RESISTANT>=128    CEFTRIAXONE Value in next row Resistant      RESISTANT>=128    CIPROFLOXACIN Value in next row Resistant      RESISTANT>=128    GENTAMICIN Value in next row Resistant      RESISTANT>=128    IMIPENEM Value in next row Resistant      RESISTANT>=128    TRIMETH/SULFA Value in next row Sensitive      RESISTANT>=128    PIP/TAZO Value in next row Resistant      RESISTANT>=128    * MODERATE GROWTH KLEBSIELLA PNEUMONIAE  Anaerobic culture     Status: None   Collection Time: 09/24/15  3:55 PM  Result Value Ref Range Status   Specimen Description BONE  Final   Special Requests Normal  Final   Culture NO ANAEROBES ISOLATED  Final   Report Status 09/29/2015 FINAL  Final  Culture, fungus without smear     Status: None   Collection Time: 09/24/15  3:55 PM  Result Value Ref Range Status   Specimen Description BONE  Final   Special Requests Normal  Final   Culture NO FUNGUS ISOLATED AFTER 24 DAYS  Final   Report Status 10/18/2015 FINAL  Final  C difficile quick scan w PCR reflex     Status: None   Collection Time: 10/07/15  2:02 PM  Result Value Ref Range Status   C Diff antigen NEGATIVE NEGATIVE Final   C Diff toxin NEGATIVE NEGATIVE Final   C Diff interpretation Negative for C. difficile  Final  Culture, blood (routine x 2)     Status: None   Collection Time: 10/16/15  9:52 AM  Result Value Ref Range Status   Specimen Description BLOOD LEFT HAND  Final   Special Requests   Final    BOTTLES DRAWN AEROBIC AND ANAEROBIC  AER 6CC ANA 4CC   Culture NO GROWTH 6 DAYS  Final   Report Status 10/22/2015 FINAL  Final  Culture, blood (routine x 2)     Status: None   Collection Time: 10/16/15 12:25 PM  Result Value Ref Range Status   Specimen Description BLOOD LEFT HAND  Final   Special Requests BOTTLES DRAWN AEROBIC AND ANAEROBIC  5CC  Final   Culture NO GROWTH 6 DAYS  Final   Report Status 10/22/2015 FINAL  Final  Culture, expectorated sputum-assessment     Status: None    Collection Time: 10/18/15  1:15 PM  Result Value Ref Range Status   Specimen Description SPUTUM  Final   Special Requests NONE  Final   Sputum evaluation THIS SPECIMEN IS ACCEPTABLE FOR SPUTUM CULTURE  Final   Report Status 10/18/2015 FINAL  Final  Culture, respiratory (NON-Expectorated)  Status: None   Collection Time: 10/18/15  1:15 PM  Result Value Ref Range Status   Specimen Description SPUTUM  Final   Special Requests NONE Reflexed from [id number]  Final   Gram Stain   Final    GOOD SPECIMEN - 80-90% WBCS MODERATE WBC SEEN MANY GRAM NEGATIVE RODS    Culture   Final    HEAVY GROWTH PSEUDOMONAS AERUGINOSA MODERATE GROWTH KLEBSIELLA PNEUMONIAE CRITICAL RESULT CALLED TO, READ BACK BY AND VERIFIED WITHRod Mae AT 4098 10/21/15 DV KLEBSIELLA PNEUMONIAE This organism isolate is resistant to one or more antiotic agents in three or more antimicrobial categories.  Suggest Infectious Disease  consult.      Report Status 10/22/2015 FINAL  Final   Organism ID, Bacteria PSEUDOMONAS AERUGINOSA  Final   Organism ID, Bacteria KLEBSIELLA PNEUMONIAE  Final      Susceptibility   Klebsiella pneumoniae - MIC*    AMPICILLIN >=32 RESISTANT Resistant     CEFAZOLIN >=64 RESISTANT Resistant     CEFTRIAXONE >=64 RESISTANT Resistant     CIPROFLOXACIN >=4 RESISTANT Resistant     GENTAMICIN >=16 RESISTANT Resistant     IMIPENEM >=16 RESISTANT Resistant     NITROFURANTOIN >=512 RESISTANT Resistant     TRIMETH/SULFA <=20 SENSITIVE Sensitive     * MODERATE GROWTH KLEBSIELLA PNEUMONIAE   Pseudomonas aeruginosa - MIC*    CEFTAZIDIME 4 SENSITIVE Sensitive     CIPROFLOXACIN >=4 RESISTANT Resistant     GENTAMICIN 8 INTERMEDIATE Intermediate     IMIPENEM >=16 RESISTANT Resistant     PIP/TAZO Value in next row Sensitive      SENSITIVE32    * HEAVY GROWTH PSEUDOMONAS AERUGINOSA  C difficile quick scan w PCR reflex     Status: None   Collection Time: 10/18/15  4:39 PM  Result Value Ref Range Status    C Diff antigen NEGATIVE NEGATIVE Final   C Diff toxin NEGATIVE NEGATIVE Final   C Diff interpretation Negative for C. difficile  Final  Culture, blood (Routine X 2) w Reflex to ID Panel     Status: None   Collection Time: 11/06/15  8:27 AM  Result Value Ref Range Status   Specimen Description BLOOD RIGHT HAND  Final   Special Requests BOTTLES DRAWN AEROBIC AND ANAEROBIC 3CC  Final   Culture NO GROWTH 5 DAYS  Final   Report Status 11/11/2015 FINAL  Final  Culture, blood (Routine X 2) w Reflex to ID Panel     Status: None   Collection Time: 11/06/15  8:29 AM  Result Value Ref Range Status   Specimen Description BLOOD LEFT HAND  Final   Special Requests BOTTLES DRAWN AEROBIC AND ANAEROBIC  1CC  Final   Culture NO GROWTH 5 DAYS  Final   Report Status 11/11/2015 FINAL  Final  Culture, expectorated sputum-assessment     Status: None   Collection Time: 11/06/15  8:41 AM  Result Value Ref Range Status   Specimen Description EXPECTORATED SPUTUM  Final   Special Requests Immunocompromised  Final   Sputum evaluation THIS SPECIMEN IS ACCEPTABLE FOR SPUTUM CULTURE  Final   Report Status 11/06/2015 FINAL  Final  Culture, respiratory (NON-Expectorated)     Status: None   Collection Time: 11/06/15  8:41 AM  Result Value Ref Range Status   Specimen Description EXPECTORATED SPUTUM  Final   Special Requests Immunocompromised Reflexed from [id number]  Final   Gram Stain   Final    MANY WBC SEEN MANY GRAM  NEGATIVE RODS EXCELLENT SPECIMEN - 90-100% WBCS    Culture   Final    MODERATE GROWTH KLEBSIELLA PNEUMONIAE MODERATE GROWTH PSEUDOMONAS AERUGINOSA CRITICAL RESULT CALLED TO, READ BACK BY AND VERIFIED WITH: DR. FITZGERALD AT 1120 11/09/15 DV DR. FITZGERALD REQUESTED ORGANISM BE SENT OUT FOR TIGECYCLINE CEFT/AVIBACTAM AND COLISTIN Sent to [facility] for further susceptibility testing. 11/11/15 CTJ    Report Status 11/15/2015 FINAL  Final   Organism ID, Bacteria KLEBSIELLA PNEUMONIAE  Final   Organism  ID, Bacteria PSEUDOMONAS AERUGINOSA  Final      Susceptibility   Klebsiella pneumoniae - MIC*    AMPICILLIN >=32 RESISTANT Resistant     CEFAZOLIN >=64 RESISTANT Resistant     CEFTRIAXONE 32 RESISTANT Resistant     CIPROFLOXACIN >=4 RESISTANT Resistant     GENTAMICIN >=16 RESISTANT Resistant     IMIPENEM >=16 RESISTANT Resistant     NITROFURANTOIN 256 RESISTANT Resistant     TRIMETH/SULFA >=320 RESISTANT Resistant     Extended ESBL POSITIVE Resistant     * MODERATE GROWTH KLEBSIELLA PNEUMONIAE   Pseudomonas aeruginosa - MIC*    CEFTAZIDIME >=64 RESISTANT Resistant     CIPROFLOXACIN 2 INTERMEDIATE Intermediate     GENTAMICIN >=16 RESISTANT Resistant     IMIPENEM >=16 RESISTANT Resistant     * MODERATE GROWTH PSEUDOMONAS AERUGINOSA  Culture, respiratory (NON-Expectorated)     Status: None   Collection Time: 11/14/15  2:07 PM  Result Value Ref Range Status   Specimen Description TRACHEAL ASPIRATE  Final   Special Requests NONE  Final   Gram Stain   Final    EXCELLENT SPECIMEN - 90-100% WBCS MANY WBC SEEN MODERATE GRAM NEGATIVE RODS    Culture   Final    HEAVY GROWTH PSEUDOMONAS AERUGINOSA CRITICAL VALUE NOTED.  VALUE IS CONSISTENT WITH PREVIOUSLY REPORTED AND CALLED VALUE.    Report Status 11/16/2015 FINAL  Final   Organism ID, Bacteria PSEUDOMONAS AERUGINOSA  Final      Susceptibility   Pseudomonas aeruginosa - MIC*    CEFTAZIDIME >=64 RESISTANT Resistant     CIPROFLOXACIN 2 INTERMEDIATE Intermediate     GENTAMICIN >=16 RESISTANT Resistant     IMIPENEM >=16 RESISTANT Resistant     PIP/TAZO Value in next row Resistant      RESISTANT>=128    TOBRAMYCIN Value in next row Sensitive      SENSITIVE2    * HEAVY GROWTH PSEUDOMONAS AERUGINOSA    Coagulation Studies: No results for input(s): LABPROT, INR in the last 72 hours.  Urinalysis: No results for input(s): COLORURINE, LABSPEC, PHURINE, GLUCOSEU, HGBUR, BILIRUBINUR, KETONESUR, PROTEINUR, UROBILINOGEN, NITRITE,  LEUKOCYTESUR in the last 72 hours.  Invalid input(s): APPERANCEUR    Imaging: No results found.   Medications:   . dextrose 25 mL/hr at 11/26/15 0700  . feeding supplement (VITAL AF 1.2 CAL) 1,000 mL (11/26/15 0200)   . albumin human  12.5 g Intravenous Once  . albumin human  12.5 g Intravenous Once  . albuterol  2.5 mg Nebulization Q6H  . amiodarone  100 mg Per Tube Daily  . antiseptic oral rinse  7 mL Mouth Rinse QID  . aspirin  81 mg Per Tube Daily  . chlorhexidine gluconate  15 mL Mouth Rinse BID  . colistimethate (COLISTIN) IV  250 mg Intravenous Q48H  . epoetin (EPOGEN/PROCRIT) injection  10,000 Units Intravenous Q M,W,F-HD  . erythromycin  200 mg Intravenous 3 times per day  . escitalopram  10 mg Per Tube Daily  . feeding  supplement (PRO-STAT SUGAR FREE 64)  30 mL Per Tube 6 X Daily  . free water  20 mL Per Tube 6 times per day  . heparin subcutaneous  5,000 Units Subcutaneous Q12H  . hydrocortisone  10 mg Per Tube BID  . lidocaine  1 patch Transdermal Q24H  . midodrine  10 mg Per Tube TID WC  . mirtazapine  7.5 mg Per Tube QHS  . multivitamin  5 mL Per Tube Daily  . pantoprazole sodium  40 mg Per Tube Daily  . sodium chloride  10-40 mL Intracatheter Q12H  . sodium hypochlorite   Irrigation BID  . tigecycline (TYGACIL) IVPB  50 mg Intravenous Q12H  . tobramycin (PF)  300 mg Nebulization BID   acetaminophen (TYLENOL) oral liquid 160 mg/5 mL, albuterol, HYDROmorphone (DILAUDID) injection, loperamide, ondansetron (ZOFRAN) IV, oxyCODONE, polyvinyl alcohol, sodium chloride, zinc oxide  Assessment/ Plan:  50 y.o. 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 Admitted on 2015/09/09  Echo: 11/07/19: EF of 30% to 35%.   1. End-stage renal disease on hemodialysis on HD MWF. The patient has been on dialysis since October of 2014. R IJ permcath.  - Dialysis treatment with IV albumin for oncotic support.  - Midodrine and hydrocortisone ordered for blood pressure support.  - Continue MWF schedule, monitor daily for dailysis treatment.    2. Anemia of CKD. Hemoglobin 9.8. Multiple transfusions during admission most recently on 1/1 - Epogen and periodic blood transfusions. Not a candidate for IV iron.   3. Acute resp failure: ventilator dependent -prolonged course on ventilator, fio2 currently 40% with PEEP of 5.  - Appreciate pulmonary input. ENT recently evaluated tracheostomy  4. Sepsis: pseudomonas in tracheal aspirate - tobramycin inhaled  - tigecycline and colistimethate   5. Secondary Hyperparathyroidism: PTH 50, phos 5.2 - not currently on a binder or vitamin D agent.     LOS: 106 Lincy Belles 1/7/20173:35 PM

## 2015-11-26 NOTE — Progress Notes (Signed)
Pt tube feeding turned down to 30 ml/hr. Was having trouble with nausea and indicated to RT that she was about to vomit. RT was able to suction patient to prevent incident and pt indicated she felt better. Will continue to monitor at 30 for now. Does not seem to be tolerating 40 per this nurse observation and dayshift nurse observation.

## 2015-11-27 LAB — GLUCOSE, CAPILLARY
GLUCOSE-CAPILLARY: 114 mg/dL — AB (ref 65–99)
GLUCOSE-CAPILLARY: 128 mg/dL — AB (ref 65–99)
Glucose-Capillary: 120 mg/dL — ABNORMAL HIGH (ref 65–99)
Glucose-Capillary: 111 mg/dL — ABNORMAL HIGH (ref 65–99)
Glucose-Capillary: 108 mg/dL — ABNORMAL HIGH (ref 65–99)
Glucose-Capillary: 134 mg/dL — ABNORMAL HIGH (ref 65–99)

## 2015-11-27 LAB — CBC
HCT: 29.6 % — ABNORMAL LOW (ref 35.0–47.0)
Hemoglobin: 9.1 g/dL — ABNORMAL LOW (ref 12.0–16.0)
MCH: 29.4 pg (ref 26.0–34.0)
MCHC: 30.7 g/dL — AB (ref 32.0–36.0)
MCV: 95.9 fL (ref 80.0–100.0)
PLATELETS: 92 10*3/uL — AB (ref 150–440)
RBC: 3.09 MIL/uL — ABNORMAL LOW (ref 3.80–5.20)
RDW: 19.4 % — AB (ref 11.5–14.5)
WBC: 15.7 10*3/uL — AB (ref 3.6–11.0)

## 2015-11-27 NOTE — Progress Notes (Addendum)
Noted patient had increased work of breathing as evidence of head bobbing and guppy breathing. Currently O2 saturation is still at 100%. Asked pt if she was in pain and she nodded her head. She opened her eyes when nurse told her that she had her pain medicine and was about to give it to her. Pt had an unfocused look and eyes were rolled back. PRN order of Oxycodone was given.    UPDATE: Decrease head movement. O2 saturation still at 100%. Eyes closed. Pt still has slight head bobbing but movement is markedly improve.

## 2015-11-27 NOTE — Progress Notes (Signed)
ANTIBIOTIC CONSULT NOTE - FOLLOW UP   Pharmacy Consult for Colistin and Tigecycline  Indication: MDR Respiratory Infection   Allergies  Allergen Reactions  . Contrast Media [Iodinated Diagnostic Agents] Anaphylaxis  . Ampicillin Rash   Patient Measurements: Height:  (SCALE BROKE) Weight: 224 lb 13.9 oz (102 kg) IBW/kg (Calculated) : 66.2  Vital Signs: Temp: 97.7 F (36.5 C) (01/08 0800) Temp Source: Oral (01/08 0800) BP: 132/80 mmHg (01/08 0900) Pulse Rate: 41 (01/08 0900) Intake/Output from previous day: 01/07 0701 - 01/08 0700 In: 2953.3 [I.V.:600; NG/GT:1550; IV Piggyback:803.3] Out: -  Intake/Output from this shift: Total I/O In: 140 [I.V.:60; NG/GT:80] Out: -   Recent Labs  11/25/15 0938 11/27/15 0436  WBC 12.8* 15.7*  HGB 9.0* 9.1*  PLT 99* 92*  CREATININE 1.13*  --    Estimated Creatinine Clearance: 75.7 mL/min (by C-G formula based on Cr of 1.13). No results for input(s): VANCOTROUGH, VANCOPEAK, VANCORANDOM, GENTTROUGH, GENTPEAK, GENTRANDOM, TOBRATROUGH, TOBRAPEAK, TOBRARND, AMIKACINPEAK, AMIKACINTROU, AMIKACIN in the last 72 hours.   Microbiology: Recent Results (from the past 720 hour(s))  Culture, blood (Routine X 2) w Reflex to ID Panel     Status: None   Collection Time: 11/06/15  8:27 AM  Result Value Ref Range Status   Specimen Description BLOOD RIGHT HAND  Final   Special Requests BOTTLES DRAWN AEROBIC AND ANAEROBIC 3CC  Final   Culture NO GROWTH 5 DAYS  Final   Report Status 11/11/2015 FINAL  Final  Culture, blood (Routine X 2) w Reflex to ID Panel     Status: None   Collection Time: 11/06/15  8:29 AM  Result Value Ref Range Status   Specimen Description BLOOD LEFT HAND  Final   Special Requests BOTTLES DRAWN AEROBIC AND ANAEROBIC  1CC  Final   Culture NO GROWTH 5 DAYS  Final   Report Status 11/11/2015 FINAL  Final  Culture, expectorated sputum-assessment     Status: None   Collection Time: 11/06/15  8:41 AM  Result Value Ref Range Status    Specimen Description EXPECTORATED SPUTUM  Final   Special Requests Immunocompromised  Final   Sputum evaluation THIS SPECIMEN IS ACCEPTABLE FOR SPUTUM CULTURE  Final   Report Status 11/06/2015 FINAL  Final  Culture, respiratory (NON-Expectorated)     Status: None   Collection Time: 11/06/15  8:41 AM  Result Value Ref Range Status   Specimen Description EXPECTORATED SPUTUM  Final   Special Requests Immunocompromised Reflexed from [id number]  Final   Gram Stain   Final    MANY WBC SEEN MANY GRAM NEGATIVE RODS EXCELLENT SPECIMEN - 90-100% WBCS    Culture   Final    MODERATE GROWTH KLEBSIELLA PNEUMONIAE MODERATE GROWTH PSEUDOMONAS AERUGINOSA CRITICAL RESULT CALLED TO, READ BACK BY AND VERIFIED WITH: DR. FITZGERALD AT 1120 11/09/15 DV DR. FITZGERALD REQUESTED ORGANISM BE SENT OUT FOR TIGECYCLINE CEFT/AVIBACTAM AND COLISTIN Sent to [facility] for further susceptibility testing. 11/11/15 CTJ    Report Status 11/15/2015 FINAL  Final   Organism ID, Bacteria KLEBSIELLA PNEUMONIAE  Final   Organism ID, Bacteria PSEUDOMONAS AERUGINOSA  Final      Susceptibility   Klebsiella pneumoniae - MIC*    AMPICILLIN >=32 RESISTANT Resistant     CEFAZOLIN >=64 RESISTANT Resistant     CEFTRIAXONE 32 RESISTANT Resistant     CIPROFLOXACIN >=4 RESISTANT Resistant     GENTAMICIN >=16 RESISTANT Resistant     IMIPENEM >=16 RESISTANT Resistant     NITROFURANTOIN 256 RESISTANT Resistant  TRIMETH/SULFA >=320 RESISTANT Resistant     Extended ESBL POSITIVE Resistant     * MODERATE GROWTH KLEBSIELLA PNEUMONIAE   Pseudomonas aeruginosa - MIC*    CEFTAZIDIME >=64 RESISTANT Resistant     CIPROFLOXACIN 2 INTERMEDIATE Intermediate     GENTAMICIN >=16 RESISTANT Resistant     IMIPENEM >=16 RESISTANT Resistant     * MODERATE GROWTH PSEUDOMONAS AERUGINOSA  Culture, respiratory (NON-Expectorated)     Status: None   Collection Time: 11/14/15  2:07 PM  Result Value Ref Range Status   Specimen Description TRACHEAL  ASPIRATE  Final   Special Requests NONE  Final   Gram Stain   Final    EXCELLENT SPECIMEN - 90-100% WBCS MANY WBC SEEN MODERATE GRAM NEGATIVE RODS    Culture   Final    HEAVY GROWTH PSEUDOMONAS AERUGINOSA CRITICAL VALUE NOTED.  VALUE IS CONSISTENT WITH PREVIOUSLY REPORTED AND CALLED VALUE.    Report Status 11/16/2015 FINAL  Final   Organism ID, Bacteria PSEUDOMONAS AERUGINOSA  Final      Susceptibility   Pseudomonas aeruginosa - MIC*    CEFTAZIDIME >=64 RESISTANT Resistant     CIPROFLOXACIN 2 INTERMEDIATE Intermediate     GENTAMICIN >=16 RESISTANT Resistant     IMIPENEM >=16 RESISTANT Resistant     PIP/TAZO Value in next row Resistant      RESISTANT>=128    TOBRAMYCIN Value in next row Sensitive      SENSITIVE2    * HEAVY GROWTH PSEUDOMONAS AERUGINOSA    Medical History: Past Medical History  Diagnosis Date  . Obesity   . Dyslipidemia   . Hypertension   . Coronary artery disease     s/p BMS 2010 LAD  . Dysrhythmia     ventricular tachycardia resolved after LAD stent and beta blocker  . Diabetes mellitus     with retinopathy, neuropathy and microalbuminemia  . 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 Patient is on day 105 of hospital stay, and has been treated for multiple infections during this time.   11/06/15: Respiratory cultures showing Pan-resistant  Klebsiella pneumoniae and Pseudomonas aeruginosa  Sputum has been sent to [facility] for susceptibility testing per Dr. Blane Ohara request.   Patient has increased respiratory secretions, leukocytosis and low grade fever   12/30: Alk phos elevated at 1309  Plan:  Per microbiology, Klebsiella is sensitive to Tygacil. Sensitivity results for Avycaz and colistin are still pending.   Continue tigecycline '50mg'$  IV q12h.   Continue  colistimethate '250mg'$  (~2.5 mg/kg) IV q48 hours.    Patient should be monitored for nephrotoxicity and neurotoxicity.  Pharmacy to continue to follow per consult.      Larene Beach, PharmD  Clinical Pharmacist   11/27/2015 10:20 AM

## 2015-11-27 NOTE — Progress Notes (Signed)
Subjective:   Wbc 15.7 (12.8) D10W at 25mL/hr  Bradycardia this morning.   Unable to tolerate more than 35 on tube feeds.   Objective:  Vital signs in last 24 hours:  Temp:  [97.4 F (36.3 C)-98.8 F (37.1 C)] 97.7 F (36.5 C) (01/08 0800) Pulse Rate:  [32-83] 41 (01/08 0900) Resp:  [14-29] 20 (01/08 0000) BP: (88-142)/(44-85) 132/80 mmHg (01/08 0900) SpO2:  [97 %-100 %] 100 % (01/08 0900) FiO2 (%):  [40 %] 40 % (01/08 0851)  Weight change:  Filed Weights   11/21/15 0500 11/21/15 1540 11/22/15 0500  Weight: 107 kg (235 lb 14.3 oz) 107 kg (235 lb 14.3 oz) 102 kg (224 lb 13.9 oz)    Intake/Output: I/O last 3 completed shifts: In: 3923.3 [I.V.:900; NG/GT:2020; IV Piggyback:1003.3] Out: -    Intake/Output this shift:  Total I/O In: 140 [I.V.:60; NG/GT:80] Out: -   Physical Exam: General: critically ill   Head/ENT: Moist oral mucosal membrane, +NG tube  Eyes: Anicteric, eyes closed  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       Basic Metabolic Panel:  Recent Labs Lab 11/21/15 0147 11/21/15 0434 11/21/15 1318 11/23/15 1339 11/25/15 0938  NA 133*  --  131* 131* 133*  K 3.6  --  3.6 3.8 3.5  CL 97*  --  96* 96* 97*  CO2 28  --  28 29 27   GLUCOSE 133*  --  117* 101* 78  BUN 67*  --  72* 72* 72*  CREATININE 1.25* 1.42* 1.46* 1.45* 1.13*  CALCIUM 8.0*  --  8.0* 8.1* 7.8*  MG 2.1  --  2.1  --   --   PHOS  --   --   --  5.2* 4.9*    Liver Function Tests:  Recent Labs Lab 11/23/15 1339 11/25/15 0938  ALBUMIN 1.5* 1.5*   No results for input(s): LIPASE, AMYLASE in the last 168 hours. No results for input(s): AMMONIA in the last 168 hours.  CBC:  Recent Labs Lab 11/21/15 1026 11/21/15 2012 11/22/15 0925 11/23/15 1339 11/25/15 0938  11/27/15 0436  WBC 19.9* 24.0* 18.1* 10.6 12.8* 15.7*  NEUTROABS 17.1*  --   --   --   --   --   HGB 9.7* 10.1* 9.6* 9.8* 9.0* 9.1*  HCT 30.8* 32.1* 30.7* 31.7* 29.2* 29.6*  MCV 91.5 91.2 90.5 91.1 91.8 95.9  PLT 186 170 153 119* 99* 92*    Cardiac Enzymes:  Recent Labs Lab 11/21/15 2012 11/22/15 0202 11/22/15 0925  TROPONINI 0.05* 0.16* 0.06*    BNP: Invalid input(s): POCBNP  CBG:  Recent Labs Lab 11/26/15 0816 11/26/15 1658 11/27/15 0023 11/27/15 0444 11/27/15 0741  GLUCAP 106* 129* 120* 111* 108*    Microbiology: Results for orders placed or performed during the hospital encounter of 08/17/2015  Blood Culture (routine x 2)     Status: None   Collection Time: 07/23/2015  8:51 AM  Result Value Ref Range Status   Specimen Description BLOOD [name]  Final   Special Requests BOTTLES DRAWN AEROBIC AND ANAEROBIC  3CC  Final   Culture NO GROWTH 5 DAYS  Final   Report Status 08/17/2015 FINAL  Final  Blood Culture (routine x 2)     Status: None   Collection Time: 08/02/2015  9:20 AM  Result Value Ref Range Status   Specimen Description BLOOD LEFT ARM  Final   Special Requests BOTTLES DRAWN AEROBIC AND ANAEROBIC  1CC  Final   Culture NO GROWTH 5 DAYS  Final   Report Status 08/17/2015 FINAL  Final  Wound culture     Status: None   Collection Time: 07/29/2015  9:20 AM  Result Value Ref Range Status   Specimen Description DECUBITIS  Final   Special Requests Normal  Final   Gram Stain   Final    FEW WBC SEEN MANY GRAM NEGATIVE RODS RARE GRAM POSITIVE COCCI    Culture   Final    HEAVY GROWTH ESCHERICHIA COLI MODERATE GROWTH ENTEROBACTER AEROGENES PROTEUS MIRABILIS HEAVY GROWTH ENTEROCOCCUS SPECIES VRE HAVE INTRINSIC RESISTANCE TO MOST COMMONLY USED ANTIBIOTICS AND THE ABILITY TO ACQUIRE RESISTANCE TO MOST AVAILABLE ANTIBIOTICS.    Report Status 08/16/2015 FINAL  Final   Organism ID, Bacteria ESCHERICHIA COLI  Final   Organism ID, Bacteria ENTEROBACTER AEROGENES  Final    Organism ID, Bacteria PROTEUS MIRABILIS  Final   Organism ID, Bacteria ENTEROCOCCUS SPECIES  Final      Susceptibility   Enterobacter aerogenes - MIC*    CEFTAZIDIME <=1 SENSITIVE Sensitive     CEFAZOLIN >=64 RESISTANT Resistant     CEFTRIAXONE <=1 SENSITIVE Sensitive     CIPROFLOXACIN <=0.25 SENSITIVE Sensitive     GENTAMICIN <=1 SENSITIVE Sensitive     IMIPENEM 1 SENSITIVE Sensitive     TRIMETH/SULFA <=20 SENSITIVE Sensitive     * MODERATE GROWTH ENTEROBACTER AEROGENES   Escherichia coli - MIC*    AMPICILLIN <=2 SENSITIVE Sensitive     CEFTAZIDIME <=1 SENSITIVE Sensitive     CEFAZOLIN <=4 SENSITIVE Sensitive     CEFTRIAXONE <=1 SENSITIVE Sensitive     CIPROFLOXACIN <=0.25 SENSITIVE Sensitive     GENTAMICIN <=1 SENSITIVE Sensitive     IMIPENEM <=0.25 SENSITIVE Sensitive     TRIMETH/SULFA <=20 SENSITIVE Sensitive     * HEAVY GROWTH ESCHERICHIA COLI   Proteus mirabilis - MIC*    AMPICILLIN >=32 RESISTANT Resistant     CEFTAZIDIME <=1 SENSITIVE Sensitive     CEFAZOLIN 8 SENSITIVE Sensitive     CEFTRIAXONE <=1 SENSITIVE Sensitive     CIPROFLOXACIN <=0.25 SENSITIVE Sensitive     GENTAMICIN <=1 SENSITIVE Sensitive     IMIPENEM 1 SENSITIVE Sensitive     TRIMETH/SULFA <=20 SENSITIVE Sensitive     * PROTEUS MIRABILIS   Enterococcus species - MIC*    AMPICILLIN >=32 RESISTANT Resistant     VANCOMYCIN >=32 RESISTANT Resistant     GENTAMICIN SYNERGY SENSITIVE Sensitive     TETRACYCLINE Value in next row Resistant      RESISTANT>=16    * HEAVY GROWTH ENTEROCOCCUS SPECIES  MRSA PCR Screening     Status: None   Collection Time: 08/01/2015  2:38 PM  Result Value Ref Range Status   MRSA by PCR NEGATIVE NEGATIVE Final    Comment:        The GeneXpert MRSA Assay (FDA approved for NASAL specimens only), is one component of a comprehensive MRSA colonization surveillance program. It is not intended to diagnose MRSA infection nor to guide or monitor treatment for MRSA infections.    Blood culture (single)     Status: None   Collection Time: 08/04/2015  3:36 PM  Result Value Ref Range Status   Specimen Description BLOOD RIGHT ASSIST CONTROL  Final   Special Requests BOTTLES DRAWN AEROBIC  AND ANAEROBIC  Final   Culture NO GROWTH 5 DAYS  Final   Report Status 08/17/2015 FINAL  Final  Wound culture     Status: None   Collection Time: 08/13/15 12:37 PM  Result Value Ref Range Status   Specimen Description WOUND  Final   Special Requests Normal  Final   Gram Stain   Final    FEW WBC SEEN TOO NUMEROUS TO COUNT GRAM NEGATIVE RODS FEW GRAM POSITIVE COCCI    Culture   Final    HEAVY GROWTH ESCHERICHIA COLI MODERATE GROWTH PROTEUS MIRABILIS LIGHT GROWTH KLEBSIELLA PNEUMONIAE MODERATE GROWTH ENTEROCOCCUS GALLINARUM CRITICAL RESULT CALLED TO, READ BACK BY AND VERIFIED WITH: [facility] BORBA AT 1042 08/16/15 DV    Report Status 08/17/2015 FINAL  Final   Organism ID, Bacteria ESCHERICHIA COLI  Final   Organism ID, Bacteria PROTEUS MIRABILIS  Final   Organism ID, Bacteria KLEBSIELLA PNEUMONIAE  Final   Organism ID, Bacteria ENTEROCOCCUS GALLINARUM  Final      Susceptibility   Escherichia coli - MIC*    AMPICILLIN >=32 RESISTANT Resistant     CEFTAZIDIME 4 RESISTANT Resistant     CEFAZOLIN >=64 RESISTANT Resistant     CEFTRIAXONE 16 RESISTANT Resistant     GENTAMICIN 2 SENSITIVE Sensitive     IMIPENEM >=16 RESISTANT Resistant     TRIMETH/SULFA <=20 SENSITIVE Sensitive     Extended ESBL POSITIVE Resistant     PIP/TAZO Value in next row Resistant      RESISTANT>=128    CIPROFLOXACIN Value in next row Sensitive      SENSITIVE<=0.25    * HEAVY GROWTH ESCHERICHIA COLI   Klebsiella pneumoniae - MIC*    AMPICILLIN Value in next row Resistant      SENSITIVE<=0.25    CEFTAZIDIME Value in next row Resistant      SENSITIVE<=0.25    CEFAZOLIN Value in next row Resistant      SENSITIVE<=0.25    CEFTRIAXONE Value in next row Resistant      SENSITIVE<=0.25    CIPROFLOXACIN  Value in next row Resistant      SENSITIVE<=0.25    GENTAMICIN Value in next row Sensitive      SENSITIVE<=0.25    IMIPENEM Value in next row Resistant      SENSITIVE<=0.25    TRIMETH/SULFA Value in next row Resistant      SENSITIVE<=0.25    PIP/TAZO Value in next row Resistant      RESISTANT>=128    * LIGHT GROWTH KLEBSIELLA PNEUMONIAE   Proteus mirabilis - MIC*    AMPICILLIN Value in next row Resistant      RESISTANT>=128    CEFTAZIDIME Value in next row Sensitive      RESISTANT>=128    CEFAZOLIN Value in next row Sensitive      RESISTANT>=128    CEFTRIAXONE Value in next row Sensitive      RESISTANT>=128    CIPROFLOXACIN Value in next row Sensitive      RESISTANT>=128    GENTAMICIN Value in next row Sensitive      RESISTANT>=128    IMIPENEM Value in next row Sensitive      RESISTANT>=128    TRIMETH/SULFA Value in next row Sensitive      RESISTANT>=128    PIP/TAZO Value in next row Sensitive      SENSITIVE<=4    * MODERATE GROWTH PROTEUS MIRABILIS   Enterococcus gallinarum - MIC*    AMPICILLIN Value in next row Resistant  SENSITIVE<=4    GENTAMICIN SYNERGY Value in next row Sensitive      SENSITIVE<=4    CIPROFLOXACIN Value in next row Resistant      RESISTANT>=8    TETRACYCLINE Value in next row Resistant      RESISTANT>=16    * MODERATE GROWTH ENTEROCOCCUS GALLINARUM  Wound culture     Status: None   Collection Time: 08/15/15  2:53 PM  Result Value Ref Range Status   Specimen Description WOUND  Final   Special Requests NONE  Final   Gram Stain FEW WBC SEEN NO ORGANISMS SEEN   Final   Culture NO GROWTH 3 DAYS  Final   Report Status 08/18/2015 FINAL  Final  C difficile quick scan w PCR reflex     Status: None   Collection Time: 08/17/15 11:34 AM  Result Value Ref Range Status   C Diff antigen NEGATIVE NEGATIVE Final   C Diff toxin NEGATIVE NEGATIVE Final   C Diff interpretation Negative for C. difficile  Final  Stool culture     Status: None    Collection Time: 09/03/15  3:51 PM  Result Value Ref Range Status   Specimen Description STOOL  Final   Special Requests Immunocompromised  Final   Culture   Final    NO SALMONELLA OR SHIGELLA ISOLATED No Pathogenic E. coli detected NO CAMPYLOBACTER DETECTED    Report Status 09/07/2015 FINAL  Final  C difficile quick scan w PCR reflex     Status: None   Collection Time: 09/13/15 12:51 AM  Result Value Ref Range Status   C Diff antigen NEGATIVE NEGATIVE Final   C Diff toxin NEGATIVE NEGATIVE Final   C Diff interpretation Negative for C. difficile  Final  Culture, blood (routine x 2)     Status: None   Collection Time: 09/16/15 11:24 AM  Result Value Ref Range Status   Specimen Description BLOOD LEFT HAND  Final   Special Requests BOTTLES DRAWN AEROBIC AND ANAEROBIC  1CC  Final   Culture NO GROWTH 5 DAYS  Final   Report Status 09/21/2015 FINAL  Final  Culture, blood (routine x 2)     Status: None   Collection Time: 09/16/15 12:23 PM  Result Value Ref Range Status   Specimen Description BLOOD RIGHT HAND  Final   Special Requests BOTTLES DRAWN AEROBIC AND ANAEROBIC  1CC  Final   Culture  Setup Time   Final    GRAM POSITIVE COCCI AEROBIC BOTTLE ONLY CRITICAL RESULT CALLED TO, READ BACK BY AND VERIFIED WITH: TESS THOMAS,RN 09/17/2015 0631 BY JRS.    Culture   Final    ENTEROCOCCUS FAECALIS AEROBIC BOTTLE ONLY VRE HAVE INTRINSIC RESISTANCE TO MOST COMMONLY USED ANTIBIOTICS AND THE ABILITY TO ACQUIRE RESISTANCE TO MOST AVAILABLE ANTIBIOTICS. CRITICAL RESULT CALLED TO, READ BACK BY AND VERIFIED WITH: CHERYL SMITH AT 1610 09/19/15 DV    Report Status 09/21/2015 FINAL  Final   Organism ID, Bacteria ENTEROCOCCUS FAECALIS  Final      Susceptibility   Enterococcus faecalis - MIC*    AMPICILLIN <=2 SENSITIVE Sensitive     LINEZOLID 2 SENSITIVE Sensitive     CIPROFLOXACIN Value in next row Resistant      RESISTANT>=8    TETRACYCLINE Value in next row Resistant      RESISTANT>=16     VANCOMYCIN Value in next row Resistant      RESISTANT>=32    GENTAMICIN SYNERGY Value in next row Resistant  RESISTANT>=32    * ENTEROCOCCUS FAECALIS  Culture, respiratory (NON-Expectorated)     Status: None   Collection Time: 09/16/15  3:50 PM  Result Value Ref Range Status   Specimen Description TRACHEAL ASPIRATE  Final   Special Requests Immunocompromised  Final   Gram Stain   Final    FEW WBC SEEN GOOD SPECIMEN - 80-90% WBCS RARE GRAM NEGATIVE RODS    Culture LIGHT GROWTH PSEUDOMONAS AERUGINOSA  Final   Report Status 09/19/2015 FINAL  Final   Organism ID, Bacteria PSEUDOMONAS AERUGINOSA  Final      Susceptibility   Pseudomonas aeruginosa - MIC*    CEFTAZIDIME 8 SENSITIVE Sensitive     CIPROFLOXACIN 2 INTERMEDIATE Intermediate     GENTAMICIN >=16 RESISTANT Resistant     IMIPENEM >=16 RESISTANT Resistant     PIP/TAZO Value in next row Sensitive      SENSITIVE32    CEFEPIME Value in next row Sensitive      SENSITIVE8    LEVOFLOXACIN Value in next row Resistant      RESISTANT>=8    * LIGHT GROWTH PSEUDOMONAS AERUGINOSA  Culture, expectorated sputum-assessment     Status: None   Collection Time: 09/22/15  2:09 PM  Result Value Ref Range Status   Specimen Description ENDOTRACHEAL  Final   Special Requests Normal  Final   Sputum evaluation THIS SPECIMEN IS ACCEPTABLE FOR SPUTUM CULTURE  Final   Report Status 09/24/2015 FINAL  Final  Culture, respiratory (NON-Expectorated)     Status: None   Collection Time: 09/22/15  2:09 PM  Result Value Ref Range Status   Specimen Description ENDOTRACHEAL  Final   Special Requests Normal Reflexed from [id number]  Final   Gram Stain   Final    FEW WBC SEEN FEW GRAM NEGATIVE RODS POOR SPECIMEN - LESS THAN 70% WBCS    Culture   Final    MODERATE GROWTH PSEUDOMONAS AERUGINOSA LIGHT GROWTH KLEBSIELLA PNEUMONIAE REFER TO SENSITIVITIES FROM PREVIOUS CULTURE FOR ORG 2    Report Status 10/01/2015 FINAL  Final   Organism ID, Bacteria  PSEUDOMONAS AERUGINOSA  Final   Organism ID, Bacteria KLEBSIELLA PNEUMONIAE  Final      Susceptibility   Pseudomonas aeruginosa - MIC*    CEFTAZIDIME 4 SENSITIVE Sensitive     CIPROFLOXACIN 2 INTERMEDIATE Intermediate     GENTAMICIN >=16 RESISTANT Resistant     IMIPENEM >=16 RESISTANT Resistant     PIP/TAZO Value in next row Sensitive      SENSITIVE16    * MODERATE GROWTH PSEUDOMONAS AERUGINOSA  Tissue culture     Status: None   Collection Time: 09/24/15  6:44 AM  Result Value Ref Range Status   Specimen Description BONE  Final   Special Requests Normal  Final   Gram Stain MODERATE WBC SEEN FEW GRAM NEGATIVE RODS   Final   Culture   Final    MODERATE GROWTH ESCHERICHIA COLI MODERATE GROWTH KLEBSIELLA PNEUMONIAE ESBL-EXTENDED SPECTRUM BETA LACTAMASE-THE ORGANISM IS RESISTANT TO PENICILLINS, CEPHALOSPORINS AND AZTREONAM ACCORDING TO CLSI [id number] VOL.25 N01 JAN 2005. ORGANISM 1 This organism isolate is resistant to one or more antiotic agents in three or more antimicrobial categories.  Suggest Infectious Disease consult.   ORGANISM 2 CRITICAL RESULT CALLED TO, READ BACK BY AND VERIFIED WITH: RN Mardene Celeste LINDSAY 09/27/15 1005AM    Report Status 09/28/2015 FINAL  Final   Organism ID, Bacteria ESCHERICHIA COLI  Final   Organism ID, Bacteria KLEBSIELLA PNEUMONIAE  Final  Susceptibility   Escherichia coli - MIC*    AMPICILLIN >=32 RESISTANT Resistant     CEFTAZIDIME 4 RESISTANT Resistant     CEFAZOLIN >=64 RESISTANT Resistant     CEFTRIAXONE 8 RESISTANT Resistant     GENTAMICIN <=1 SENSITIVE Sensitive     IMIPENEM 8 RESISTANT Resistant     TRIMETH/SULFA <=20 SENSITIVE Sensitive     Extended ESBL POSITIVE Resistant     PIP/TAZO Value in next row Resistant      RESISTANT>=128    * MODERATE GROWTH ESCHERICHIA COLI   Klebsiella pneumoniae - MIC*    AMPICILLIN Value in next row Resistant      RESISTANT>=128    CEFTAZIDIME Value in next row Resistant      RESISTANT>=128     CEFAZOLIN Value in next row Resistant      RESISTANT>=128    CEFTRIAXONE Value in next row Resistant      RESISTANT>=128    CIPROFLOXACIN Value in next row Resistant      RESISTANT>=128    GENTAMICIN Value in next row Resistant      RESISTANT>=128    IMIPENEM Value in next row Resistant      RESISTANT>=128    TRIMETH/SULFA Value in next row Sensitive      RESISTANT>=128    PIP/TAZO Value in next row Resistant      RESISTANT>=128    * MODERATE GROWTH KLEBSIELLA PNEUMONIAE  Anaerobic culture     Status: None   Collection Time: 09/24/15  3:55 PM  Result Value Ref Range Status   Specimen Description BONE  Final   Special Requests Normal  Final   Culture NO ANAEROBES ISOLATED  Final   Report Status 09/29/2015 FINAL  Final  Culture, fungus without smear     Status: None   Collection Time: 09/24/15  3:55 PM  Result Value Ref Range Status   Specimen Description BONE  Final   Special Requests Normal  Final   Culture NO FUNGUS ISOLATED AFTER 24 DAYS  Final   Report Status 10/18/2015 FINAL  Final  C difficile quick scan w PCR reflex     Status: None   Collection Time: 10/07/15  2:02 PM  Result Value Ref Range Status   C Diff antigen NEGATIVE NEGATIVE Final   C Diff toxin NEGATIVE NEGATIVE Final   C Diff interpretation Negative for C. difficile  Final  Culture, blood (routine x 2)     Status: None   Collection Time: 10/16/15  9:52 AM  Result Value Ref Range Status   Specimen Description BLOOD LEFT HAND  Final   Special Requests   Final    BOTTLES DRAWN AEROBIC AND ANAEROBIC  AER 6CC ANA 4CC   Culture NO GROWTH 6 DAYS  Final   Report Status 10/22/2015 FINAL  Final  Culture, blood (routine x 2)     Status: None   Collection Time: 10/16/15 12:25 PM  Result Value Ref Range Status   Specimen Description BLOOD LEFT HAND  Final   Special Requests BOTTLES DRAWN AEROBIC AND ANAEROBIC  5CC  Final   Culture NO GROWTH 6 DAYS  Final   Report Status 10/22/2015 FINAL  Final  Culture,  expectorated sputum-assessment     Status: None   Collection Time: 10/18/15  1:15 PM  Result Value Ref Range Status   Specimen Description SPUTUM  Final   Special Requests NONE  Final   Sputum evaluation THIS SPECIMEN IS ACCEPTABLE FOR SPUTUM CULTURE  Final  Report Status 10/18/2015 FINAL  Final  Culture, respiratory (NON-Expectorated)     Status: None   Collection Time: 10/18/15  1:15 PM  Result Value Ref Range Status   Specimen Description SPUTUM  Final   Special Requests NONE Reflexed from [id number]  Final   Gram Stain   Final    GOOD SPECIMEN - 80-90% WBCS MODERATE WBC SEEN MANY GRAM NEGATIVE RODS    Culture   Final    HEAVY GROWTH PSEUDOMONAS AERUGINOSA MODERATE GROWTH KLEBSIELLA PNEUMONIAE CRITICAL RESULT CALLED TO, READ BACK BY AND VERIFIED WITHRod Mae AT 4098 10/21/15 DV KLEBSIELLA PNEUMONIAE This organism isolate is resistant to one or more antiotic agents in three or more antimicrobial categories.  Suggest Infectious Disease  consult.      Report Status 10/22/2015 FINAL  Final   Organism ID, Bacteria PSEUDOMONAS AERUGINOSA  Final   Organism ID, Bacteria KLEBSIELLA PNEUMONIAE  Final      Susceptibility   Klebsiella pneumoniae - MIC*    AMPICILLIN >=32 RESISTANT Resistant     CEFAZOLIN >=64 RESISTANT Resistant     CEFTRIAXONE >=64 RESISTANT Resistant     CIPROFLOXACIN >=4 RESISTANT Resistant     GENTAMICIN >=16 RESISTANT Resistant     IMIPENEM >=16 RESISTANT Resistant     NITROFURANTOIN >=512 RESISTANT Resistant     TRIMETH/SULFA <=20 SENSITIVE Sensitive     * MODERATE GROWTH KLEBSIELLA PNEUMONIAE   Pseudomonas aeruginosa - MIC*    CEFTAZIDIME 4 SENSITIVE Sensitive     CIPROFLOXACIN >=4 RESISTANT Resistant     GENTAMICIN 8 INTERMEDIATE Intermediate     IMIPENEM >=16 RESISTANT Resistant     PIP/TAZO Value in next row Sensitive      SENSITIVE32    * HEAVY GROWTH PSEUDOMONAS AERUGINOSA  C difficile quick scan w PCR reflex     Status: None   Collection Time:  10/18/15  4:39 PM  Result Value Ref Range Status   C Diff antigen NEGATIVE NEGATIVE Final   C Diff toxin NEGATIVE NEGATIVE Final   C Diff interpretation Negative for C. difficile  Final  Culture, blood (Routine X 2) w Reflex to ID Panel     Status: None   Collection Time: 11/06/15  8:27 AM  Result Value Ref Range Status   Specimen Description BLOOD RIGHT HAND  Final   Special Requests BOTTLES DRAWN AEROBIC AND ANAEROBIC 3CC  Final   Culture NO GROWTH 5 DAYS  Final   Report Status 11/11/2015 FINAL  Final  Culture, blood (Routine X 2) w Reflex to ID Panel     Status: None   Collection Time: 11/06/15  8:29 AM  Result Value Ref Range Status   Specimen Description BLOOD LEFT HAND  Final   Special Requests BOTTLES DRAWN AEROBIC AND ANAEROBIC  1CC  Final   Culture NO GROWTH 5 DAYS  Final   Report Status 11/11/2015 FINAL  Final  Culture, expectorated sputum-assessment     Status: None   Collection Time: 11/06/15  8:41 AM  Result Value Ref Range Status   Specimen Description EXPECTORATED SPUTUM  Final   Special Requests Immunocompromised  Final   Sputum evaluation THIS SPECIMEN IS ACCEPTABLE FOR SPUTUM CULTURE  Final   Report Status 11/06/2015 FINAL  Final  Culture, respiratory (NON-Expectorated)     Status: None   Collection Time: 11/06/15  8:41 AM  Result Value Ref Range Status   Specimen Description EXPECTORATED SPUTUM  Final   Special Requests Immunocompromised Reflexed from [id number]  Final  Gram Stain   Final    MANY WBC SEEN MANY GRAM NEGATIVE RODS EXCELLENT SPECIMEN - 90-100% WBCS    Culture   Final    MODERATE GROWTH KLEBSIELLA PNEUMONIAE MODERATE GROWTH PSEUDOMONAS AERUGINOSA CRITICAL RESULT CALLED TO, READ BACK BY AND VERIFIED WITH: DR. FITZGERALD AT 1120 11/09/15 DV DR. FITZGERALD REQUESTED ORGANISM BE SENT OUT FOR TIGECYCLINE CEFT/AVIBACTAM AND COLISTIN Sent to [facility] for further susceptibility testing. 11/11/15 CTJ    Report Status 11/15/2015 FINAL  Final   Organism  ID, Bacteria KLEBSIELLA PNEUMONIAE  Final   Organism ID, Bacteria PSEUDOMONAS AERUGINOSA  Final      Susceptibility   Klebsiella pneumoniae - MIC*    AMPICILLIN >=32 RESISTANT Resistant     CEFAZOLIN >=64 RESISTANT Resistant     CEFTRIAXONE 32 RESISTANT Resistant     CIPROFLOXACIN >=4 RESISTANT Resistant     GENTAMICIN >=16 RESISTANT Resistant     IMIPENEM >=16 RESISTANT Resistant     NITROFURANTOIN 256 RESISTANT Resistant     TRIMETH/SULFA >=320 RESISTANT Resistant     Extended ESBL POSITIVE Resistant     * MODERATE GROWTH KLEBSIELLA PNEUMONIAE   Pseudomonas aeruginosa - MIC*    CEFTAZIDIME >=64 RESISTANT Resistant     CIPROFLOXACIN 2 INTERMEDIATE Intermediate     GENTAMICIN >=16 RESISTANT Resistant     IMIPENEM >=16 RESISTANT Resistant     * MODERATE GROWTH PSEUDOMONAS AERUGINOSA  Culture, respiratory (NON-Expectorated)     Status: None   Collection Time: 11/14/15  2:07 PM  Result Value Ref Range Status   Specimen Description TRACHEAL ASPIRATE  Final   Special Requests NONE  Final   Gram Stain   Final    EXCELLENT SPECIMEN - 90-100% WBCS MANY WBC SEEN MODERATE GRAM NEGATIVE RODS    Culture   Final    HEAVY GROWTH PSEUDOMONAS AERUGINOSA CRITICAL VALUE NOTED.  VALUE IS CONSISTENT WITH PREVIOUSLY REPORTED AND CALLED VALUE.    Report Status 11/16/2015 FINAL  Final   Organism ID, Bacteria PSEUDOMONAS AERUGINOSA  Final      Susceptibility   Pseudomonas aeruginosa - MIC*    CEFTAZIDIME >=64 RESISTANT Resistant     CIPROFLOXACIN 2 INTERMEDIATE Intermediate     GENTAMICIN >=16 RESISTANT Resistant     IMIPENEM >=16 RESISTANT Resistant     PIP/TAZO Value in next row Resistant      RESISTANT>=128    TOBRAMYCIN Value in next row Sensitive      SENSITIVE2    * HEAVY GROWTH PSEUDOMONAS AERUGINOSA    Coagulation Studies: No results for input(s): LABPROT, INR in the last 72 hours.  Urinalysis: No results for input(s): COLORURINE, LABSPEC, PHURINE, GLUCOSEU, HGBUR, BILIRUBINUR,  KETONESUR, PROTEINUR, UROBILINOGEN, NITRITE, LEUKOCYTESUR in the last 72 hours.  Invalid input(s): APPERANCEUR    Imaging: No results found.   Medications:   . dextrose 25 mL/hr at 11/27/15 0313  . feeding supplement (VITAL AF 1.2 CAL) 1,000 mL (11/27/15 0558)   . albumin human  12.5 g Intravenous Once  . albumin human  12.5 g Intravenous Once  . albuterol  2.5 mg Nebulization Q6H  . amiodarone  100 mg Per Tube Daily  . antiseptic oral rinse  7 mL Mouth Rinse QID  . aspirin  81 mg Per Tube Daily  . chlorhexidine gluconate  15 mL Mouth Rinse BID  . colistimethate (COLISTIN) IV  250 mg Intravenous Q48H  . epoetin (EPOGEN/PROCRIT) injection  10,000 Units Intravenous Q M,W,F-HD  . erythromycin  200 mg Intravenous 3 times per  day  . escitalopram  10 mg Per Tube Daily  . feeding supplement (PRO-STAT SUGAR FREE 64)  30 mL Per Tube 6 X Daily  . free water  20 mL Per Tube 6 times per day  . heparin subcutaneous  5,000 Units Subcutaneous Q12H  . hydrocortisone  10 mg Per Tube BID  . lidocaine  1 patch Transdermal Q24H  . midodrine  10 mg Per Tube TID WC  . mirtazapine  7.5 mg Per Tube QHS  . multivitamin  5 mL Per Tube Daily  . pantoprazole sodium  40 mg Per Tube Daily  . sodium chloride  10-40 mL Intracatheter Q12H  . sodium hypochlorite   Irrigation BID  . tigecycline (TYGACIL) IVPB  50 mg Intravenous Q12H  . tobramycin (PF)  300 mg Nebulization BID   acetaminophen (TYLENOL) oral liquid 160 mg/5 mL, albuterol, HYDROmorphone (DILAUDID) injection, loperamide, ondansetron (ZOFRAN) IV, oxyCODONE, polyvinyl alcohol, sodium chloride, zinc oxide  Assessment/ Plan:  50 y.o. 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 Admitted on 20-Aug-2015  Echo: 11/07/19: EF of 30% to 35%.   1. End-stage renal disease on hemodialysis on HD MWF. The patient has been on dialysis since October of 2014. R IJ permcath.  - Dialysis treatment with IV albumin for oncotic support.  - Midodrine and hydrocortisone ordered for blood pressure support.  - Continue MWF schedule, monitor daily for dailysis treatment.  Next treatment scheduled for tomorrow.   2. Anemia of CKD. Hemoglobin 9.1. Multiple transfusions during admission most recently on 1/1 - Epogen and periodic blood transfusions. Not a candidate for IV iron.   3. Acute resp failure: ventilator dependent -prolonged course on ventilator, fio2 currently 40% with PEEP of 5.  - Appreciate pulmonary input. ENT recently evaluated tracheostomy  4. Sepsis: pseudomonas in tracheal aspirate - tobramycin inhaled  - tigecycline, erythromycin and colistimethate   5. Secondary Hyperparathyroidism: PTH 50, phos 5.2 - not currently on a binder or vitamin D agent.     LOS: 107 Courtney [name] 1/8/201710:44 AM

## 2015-11-27 NOTE — Progress Notes (Signed)
MEDICATION RELATED CONSULT NOTE - Follow up   Pharmacy Consult for Renal dosing  Allergies  Allergen Reactions  . Contrast Media [Iodinated Diagnostic Agents] Anaphylaxis  . Ampicillin Rash   Patient Measurements: Ht 195ft 9in Height:  (SCALE BROKE) Weight: 224 lb 13.9 oz (102 kg) IBW/kg (Calculated) : 66.2  Vital Signs: Temp: 97.7 F (36.5 C) (01/08 0800) Temp Source: Oral (01/08 0800) BP: 132/80 mmHg (01/08 0900) Pulse Rate: 41 (01/08 0900)  Recent Labs  11/25/15 0938 11/27/15 0436  WBC 12.8* 15.7*  HGB 9.0* 9.1*  HCT 29.2* 29.6*  PLT 99* 92*  CREATININE 1.13*  --   PHOS 4.9*  --   ALBUMIN 1.5*  --    Estimated Creatinine Clearance: 75.7 mL/min (by C-G formula based on Cr of 1.13).  Assessment: 50 yo patient on HD. Pharmacy consulted for renal dosing of medications.   Plan:  No medications require adjustment at present. Pharmacy will continue to monitor and dose adjust as needed.     Courtney [name], PharmD   11/27/2015

## 2015-11-27 NOTE — Progress Notes (Signed)
PULMONARY / CRITICAL CARE MEDICINE   Name: Courtney [name] MRN: [id number] DOB: June 07, 1966    ADMISSION DATE:  08/08/2015  BRIEF HISTORY: 49 AAF who has been in medical facilities (hosp, LTAC, rehab) for 2 yrs following gastric bypass surgery with multiple complications. Now with chronic trach, ESRD, profound debilitation, severe sacral pressure ulcer. Was seemingly making progress and transferred to rehab facility approx one week prior to this admission. She was sent to [facility] ED with AMS and hypotension. Working dx of severe sepsis/septic shock due to infected sacral pressure ulcer. Since admission. Her course is been very complicated with numerous complications including septic shock and GI bleeding. Now with failure to wean from vent and possible MI event, repeat ECHO EF 30-35, possible old MI, severe hypokinesis of the nnterior and anteroseptal myocardium   SUBJ: lethargic today, she cannot provide a history, she is arousable to physical stimulation.  Summary of MAJOR EVENTS/TEST RESULTS: Admission 02/07/14-05/07/14 Admission 07/21/14-09/06/14 Discharged to [facility]. Pt had palliative consult at that time, were asked to sign off by husband.  09/23 CT head: NAD 09/23 EEG: no epileptiform activity 09/23 PRBCs for Hgb 6.4 09/24 bedside debridement of sacral wound. Abscess drained 09/25 Off vasopressors. More alert. No distress. Worsening thrombocytopenia. Vanc DC'd 09/29 Dr. Sampson Goon (I.D) excused from the case by patient's husband. 10/02 MRI -multiple infarcts 10/03 tracheal bleeding- transfused platelets 10/03 hospitalist service excused from the case by patient's husband 10/03 Echocardiogram ejection fraction was 55-60%, pulmonary systolic pressure was 39 mmHg 40/98 restart TF's at lower rate, attempt reg HD  10/12 Transferred to med-surg floor. Remains on PCCM service 10/14 SLP eval: pt unable to tolerate PMV adequately 10/17-will re-attempt PM valve-discussed with Speech  therapist 10/18 passed swallow eval-start pureed thick foods no thin liquids-continue NG feeds 10/19 transferred to step down for sepsis/aspiration pneumonia 10/19 cxr shows RLL opacity 10/21 sacral decub debride at bedside by surgery 10/22 started back on vasopressors while on HD 10/27 CT with osteo, R hip fx, unable to identify tip of dubhoff tube - sent for fluoro study 10/28 Ortho consultation: I do not feel that she is a surgical candidate. Therefore, I feel that it would best to manage this fracture nonsurgically and allow it to heal by itself over time, which it should.  10/28 Gen Surg consultation: Due to the lack of free air or free flow of contrast and the peritoneum there is no indication for any surgical intervention on this. Would recommend pulling back the feeding tube 1-2 cm. Would recheck an abdominal film to confirm no pneumoperitoneum in the morning. Absence of any changes okay to continue using Dobbhoff for feeding and medications. 10/28 gastrograffin study: The study confirms that the feeding tube pes perforated through the duodenum and the tip is within a cavity that fills with injected contrast. The cavity does appear walled off 11/4 refuses oral feeds, continue TF's CT reviewed: T8-T9 discitis/osteomyelitis, RT HIP FRACTURE 11/5 placed back on vasopressors, placed back on Vent due to resp acidosis, levophed turned off 11/6 afternoon 11/5 PM Trach changed out due to cuff leak, #6 Shiley cuffed, 11/6 dubhoff occluded, removed and replaced, new dubhoff shows tube in the antral stomach 11/15 refusing to take oral feeds 11/18 placed back on Vent for increased WOB,SOB. 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 10/18/2015; the patient developed a new left basal pneumonia; with recurrent sepsis.  10/19/2015; fevers resolved with IV acetaminophen. Tube  feeds changed from vital to Jevity.  12/7 CXR with  stable b/l opacities.  12/12 elevated K s/p HD-remains on AC mode 12/16-vomiting-hold feeds and re-assess in next 24 hrs 12/17- TF restarted at 1/2 goal (goal = 50cc/per) 12/18- elevated wbc, fever (101.4), recultured, restarted on ceftaz 12/19 Pericardial friction rub. Echocardiogram: LVEF 30-35%, anteroseptal hypokinesis or akinesis c/w anterior wall MI 12/20 Dr Sung Amabile discussed new echocardiogram findings with pt's husband and explained that this likely represents an anterior wall MI sometime in the previous couple of weeks. It was explained that she is not a candidate for any cardiology intervention. Code status was again discussed. Husband expressed understanding that she was not going to survive this illness in any favorable way but continues to request that she be full code status based on what he believes to be her wishes 12/21 vomitied 50cc of TF - AM TF held, restart TF at 3pm @30cc  12/26 Persistent intermittent trach cuff leak. Distal XLT trach requested.  12/27 15 beat NSVT 12/28 patient refused HD and refused Bath time 12/29 D10 infusion for hypoglycemia, TF's on hold due to intolerance 12/30 CODE BLUE  VTACH 01/01 transfuse 2 units of blood 01/02 increased respiratory distress, copious purulent secretions 01/02 LE venous US: No DVT 01/03 Repeat TTE: The cavity size was mildly dilated. Systolic function was moderately to severely reduced. The estimated ejection fraction was in the range of 30% to 35%. Diffuse hypokinesis. Moderate to severe hypokinesis of the anteroseptal, anterior, and apical myocardium. Hypokinesis of the anterior myocardium 01/05 Hypothermic - Bair Hugger ordered  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 History of sepsis from C.  glabrata MRSA PCR 9/23 >> NEG Wound (swab) 9/23 >> multiple organisms Wound (debridement) 9/24 >> Enterococcus, K. Pneumoniae, P. Mirabilis, VRE Wound 9/26 >> No growth Blood 9/23 >> NEG CDiff 9/27>>neg Stool Cx 10/15>> negative Cdiff 10/25>>neg Trach Aspirate 10/28>> light growth pseudomonas Blood 10/28 >> 1/2 GPC >>  Sputum cultures obtained 09/22/15 due to mucus plugging>>Pseudomonas BONE TISSUE Cx 11/3>>E.coli (ESBL), k. Pneumonia (daptomycin and septra) Bld Cx 11/27>>negative thus far.  Trach Asp 11/27>> grew out Pseudomonas, and Klebsiella pneumonia. Trach Asp 12/18>> MDR Klebsiella, Pseudomonas Bld Cx 12/18>> NEG 12/26 resp culture >> MDR pseudomonas  ANTIMICROBIALS:  Aztreonam 9/23 >> 9/24 Vanc 9/23 >> 9/25 Vanc 9/26>>9/27 Daptomycin 9/27>> 10/12, 10/28>> off Meropenem 9/23 >> 10/14, 10/28 >> 10/31 Zosyn 11/27,>> 11/30. Levaquin 11/29>> 11/29. Ceftaz 12/1>>12/11, 12/18>> 12/24 Colisitin 12/26 >>  Tigecycline 12/26 >>  Nebulized tobramycin 12/30 >>  VITAL SIGNS: Temp:  [97.4 F (36.3 C)-98.8 F (37.1 C)] 97.4 F (36.3 C) (01/08 1200) Pulse Rate:  [40-83] 40 (01/08 1200) Resp:  [14-23] 18 (01/08 1200) BP: (108-142)/(46-85) 127/66 mmHg (01/08 1200) SpO2:  [98 %-100 %] 99 % (01/08 1200) FiO2 (%):  [40 %] 40 % (01/08 1152) HEMODYNAMICS:   VENTILATOR SETTINGS: Vent Mode:  [-] PRVC FiO2 (%):  [40 %] 40 % Set Rate:  [14 bmp] 14 bmp Vt Set:  [500 mL] 500 mL PEEP:  [5 cmH20] 5 cmH20 Plateau Pressure:  [32 cmH20-39 cmH20] 39 cmH20 INTAKE / OUTPUT:  Intake/Output Summary (Last 24 hours) at 11/27/15 1425 Last data filed at 11/27/15 1200  Gross per 24 hour  Intake 2438.33 ml  Output      0 ml  Net  2438.33 ml    Review of Systems  Unable to perform ROS  Physical Exam  RASS 0, intermittent trach tube cuff leak continues today,  HEENT; Cushingoid facies, temporal wasting, NGT present. Tube feeds are infusing. Trach site clean Diffuse rhonchi Reg with occ  extrasystoles, no M Abdomen soft, + BS Ext cool, trace symmetric edema, marked muscle wasting Severe sacral decubitus ulcer-   LABS:  CBC  Recent Labs Lab 11/23/15 1339 11/25/15 0938 11/27/15 0436  WBC 10.6 12.8* 15.7*  HGB 9.8* 9.0* 9.1*  HCT 31.7* 29.2* 29.6*  PLT 119* 99* 92*   Coag's  Recent Labs Lab 11/21/15 2012  APTT 41*  INR 1.57   BMET  Recent Labs Lab 11/21/15 1318 11/23/15 1339 11/25/15 0938  NA 131* 131* 133*  K 3.6 3.8 3.5  CL 96* 96* 97*  CO2 28 29 27   BUN 72* 72* 72*  CREATININE 1.46* 1.45* 1.13*  GLUCOSE 117* 101* 78   Electrolytes  Recent Labs Lab 11/21/15 0147 11/21/15 1318 11/23/15 1339 11/25/15 0938  CALCIUM 8.0* 8.0* 8.1* 7.8*  MG 2.1 2.1  --   --   PHOS  --   --  5.2* 4.9*   Sepsis Markers No results for input(s): LATICACIDVEN, PROCALCITON, O2SATVEN in the last 168 hours. ABG  Recent Labs Lab 11/21/15 1745  PHART 7.41  PCO2ART 47  PO2ART 78*   Liver Enzymes  Recent Labs Lab 11/23/15 1339 11/25/15 0938  ALBUMIN 1.5* 1.5*   Cardiac Enzymes  Recent Labs Lab 11/21/15 2012 11/22/15 0202 11/22/15 0925  TROPONINI 0.05* 0.16* 0.06*   Glucose  Recent Labs Lab 11/26/15 0816 11/26/15 1658 11/27/15 0023 11/27/15 0444 11/27/15 0741 11/27/15 1209  GLUCAP 106* 129* 120* 111* 108* 114*    Imaging No results found.  ASSESSMENT / PLAN: IMPRESSION: Very prolonged and complicated hospitalization after gastric bypass surgery 2014 Never returned home ESRD Recurrent severe sepsis - PAN resistant Klebisella in sputum Recurrent PNA-now with pseudomonas H/O C diff H/O VTE DM2 - controlled Episodic hypoglycemia Chronic steroid therapy, for a history of of adrenal insufficiency Incidental finding of R hip fx - conservative mgmt. 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 Husband declined TEE Profound deconditioning Chronic pain New finding of pericardial friction rub 12/19 - resolved New finding of cardiomyopathy, likely post MI 12/19  PAF/flutter Sinus bradycardia HCAP with MDR Donalee Citrin and pseudomonas Chronic anemia Severe protein - calorie malnutrition TF intolerance Mild thrombocytopenia 01/06  Cont vent support - settings reviewed and/or adjusted Cont vent bundle Cont HD per renal Monitor BMET intermittently Monitor I/Os Correct electrolytes as indicated DVT px: SQ heparin Monitor CBC intermittently Transfuse per usual guidelines Resume TF's at low rate Monitor temp, WBC count Micro and abx as above ID following - appreciate recs Cont Amiodarone - 100 mg per tube daily DC metoprolol Cont opiates PRN Cont D10 Infusion for hypoglycemia Resume TFs @ 10 cc/hr   Prognosis remains very poor without hope for functional recovery. I left a phone message on husbands VM.   The above time includes time spent in consultation with patient and/or family members and reviewing care plan on multidisciplinary rounds    Shane Crutch, MD [facility]       Critical Care Attestation.  I have personally obtained a history, examined  the patient, evaluated laboratory and imaging results, formulated the assessment and plan and placed orders. The Patient requires high complexity decision making for assessment and support, frequent evaluation and titration of therapies, application of advanced monitoring technologies and extensive interpretation of multiple databases. The patient has critical illness that could lead imminently to failure of 1 or more organ systems and requires the highest level of physician preparedness to intervene.  Critical Care Time devoted to patient care services described in this note is 35 minutes and is  exclusive of time spent in procedures.

## 2015-11-27 NOTE — Progress Notes (Signed)
3 cc air added to tracheal tube cuff to prevent leak

## 2015-11-27 NOTE — Plan of Care (Signed)
Problem: Phase I Progression Outcomes Goal: Code status addressed with pt/family Outcome: Completed/Met Date Met:  11/27/15 [name] maintains the decision to keep patient a Full code even after discussion with physician that pt is guarded.  Goal: Baseline oxygen/pH stable Outcome: Progressing Pt at 40% FiO2 Goal: Patient tolerating nututrition at goal Outcome: Not Progressing Pt maintained at 30 cc/hr due to nausea and gas.  Goal: Progressing towards optiumm acitivities Outcome: Not Progressing Pt is futile Goal: Patient tolerating weaning plan Outcome: Not Progressing Unable to wean from ventilator Goal: Voiding-avoid urinary catheter unless indicated Outcome: Not Applicable Date Met:  62/83/66 Pt is anuric

## 2015-11-28 LAB — RENAL FUNCTION PANEL
ALBUMIN: 1.6 g/dL — AB (ref 3.5–5.0)
Anion gap: 8 (ref 5–15)
BUN: 102 mg/dL — AB (ref 6–20)
CALCIUM: 8 mg/dL — AB (ref 8.9–10.3)
CO2: 26 mmol/L (ref 22–32)
CREATININE: 1.45 mg/dL — AB (ref 0.44–1.00)
Chloride: 95 mmol/L — ABNORMAL LOW (ref 101–111)
GFR calc Af Amer: 48 mL/min — ABNORMAL LOW (ref >60)
GFR, EST NON AFRICAN AMERICAN: 41 mL/min — AB (ref >60)
GLUCOSE: 109 mg/dL — AB (ref 65–99)
PHOSPHORUS: 6.1 mg/dL — AB (ref 2.5–4.6)
Potassium: 3.8 mmol/L (ref 3.5–5.1)
SODIUM: 129 mmol/L — AB (ref 135–145)

## 2015-11-28 LAB — CBC
HCT: 29.8 % — ABNORMAL LOW (ref 35.0–47.0)
HCT: 29.6 % — ABNORMAL LOW (ref 35.0–47.0)
Hemoglobin: 9.1 g/dL — ABNORMAL LOW (ref 12.0–16.0)
Hemoglobin: 9 g/dL — ABNORMAL LOW (ref 12.0–16.0)
MCH: 28.8 pg (ref 26.0–34.0)
MCH: 28.8 pg (ref 26.0–34.0)
MCHC: 30.7 g/dL — ABNORMAL LOW (ref 32.0–36.0)
MCHC: 30.4 g/dL — AB (ref 32.0–36.0)
MCV: 93.8 fL (ref 80.0–100.0)
MCV: 94.8 fL (ref 80.0–100.0)
PLATELETS: 94 10*3/uL — AB (ref 150–440)
PLATELETS: 81 10*3/uL — AB (ref 150–440)
RBC: 3.17 MIL/uL — AB (ref 3.80–5.20)
RBC: 3.12 MIL/uL — ABNORMAL LOW (ref 3.80–5.20)
RDW: 19.6 % — ABNORMAL HIGH (ref 11.5–14.5)
RDW: 19.1 % — AB (ref 11.5–14.5)
WBC: 15.6 10*3/uL — AB (ref 3.6–11.0)
WBC: 19.6 10*3/uL — AB (ref 3.6–11.0)

## 2015-11-28 LAB — GLUCOSE, CAPILLARY
GLUCOSE-CAPILLARY: 109 mg/dL — AB (ref 65–99)
Glucose-Capillary: 93 mg/dL (ref 65–99)

## 2015-11-28 MED ORDER — CETYLPYRIDINIUM CHLORIDE 0.05 % MT LIQD
7.0000 mL | Freq: Two times a day (BID) | OROMUCOSAL | Status: DC
  Administered 2015-11-28 – 2015-12-06 (×17): 7 mL via OROMUCOSAL

## 2015-11-28 MED ORDER — DEXTROSE 5 % IV SOLN
0.0000 ug/min | INTRAVENOUS | Status: DC
  Administered 2015-11-28: 3 ug/min via INTRAVENOUS
  Administered 2015-11-29: 4 ug/min via INTRAVENOUS
  Administered 2015-12-02: 10 ug/min via INTRAVENOUS
  Administered 2015-12-03: 14 ug/min via INTRAVENOUS
  Administered 2015-12-04: 3 ug/min via INTRAVENOUS
  Filled 2015-11-28 (×6): qty 16

## 2015-11-28 NOTE — Progress Notes (Signed)
Subjective:  Patient remains critically ill Did not respond to voice commands today Per nurse, she will open eyes at times.  BP remains borderline   Objective:  Vital signs in last 24 hours:  Temp:  [93.7 F (34.3 C)-97.4 F (36.3 C)] 93.7 F (34.3 C) (01/09 0728) Pulse Rate:  [36-80] 37 (01/09 0800) Resp:  [14-21] 14 (01/09 0800) BP: (93-139)/(49-84) 109/73 mmHg (01/09 0800) SpO2:  [99 %-100 %] 100 % (01/09 0921) FiO2 (%):  [40 %] 40 % (01/09 0921)  Weight change:  Filed Weights   11/21/15 0500 11/21/15 1540 11/22/15 0500  Weight: 107 kg (235 lb 14.3 oz) 107 kg (235 lb 14.3 oz) 102 kg (224 lb 13.9 oz)    Intake/Output: I/O last 3 completed shifts: In: 3518.3 [I.V.:885; NG/GT:1930; IV Piggyback:703.3] Out: 5 [Emesis/NG output:5]   Intake/Output this shift:     Physical Exam: General: critically ill   Head/ENT: Moist oral mucosal membrane, +NG tube  Eyes: Anicteric, eyes closed  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       Basic Metabolic Panel:  Recent Labs Lab 11/21/15 1318 11/23/15 1339 11/25/15 0938  NA 131* 131* 133*  K 3.6 3.8 3.5  CL 96* 96* 97*  CO2 28 29 27   GLUCOSE 117* 101* 78  BUN 72* 72* 72*  CREATININE 1.46* 1.45* 1.13*  CALCIUM 8.0* 8.1* 7.8*  MG 2.1  --   --   PHOS  --  5.2* 4.9*    Liver Function Tests:  Recent Labs Lab 11/23/15 1339 11/25/15 0938  ALBUMIN 1.5* 1.5*   No results for input(s): LIPASE, AMYLASE in the last 168 hours. No results for input(s): AMMONIA in the last 168 hours.  CBC:  Recent Labs Lab 11/21/15 1026  11/22/15 0925 11/23/15 1339 11/25/15 0938 11/27/15 0436 11/28/15 0458  WBC 19.9*  < > 18.1* 10.6 12.8* 15.7* 15.6*  NEUTROABS 17.1*  --   --   --   --   --   --   HGB 9.7*   < > 9.6* 9.8* 9.0* 9.1* 9.1*  HCT 30.8*  < > 30.7* 31.7* 29.2* 29.6* 29.8*  MCV 91.5  < > 90.5 91.1 91.8 95.9 93.8  PLT 186  < > 153 119* 99* 92* 94*  < > = values in this interval not displayed.  Cardiac Enzymes:  Recent Labs Lab 11/21/15 2012 11/22/15 0202 11/22/15 0925  TROPONINI 0.05* 0.16* 0.06*    BNP: Invalid input(s): POCBNP  CBG:  Recent Labs Lab 11/27/15 0741 11/27/15 1209 11/27/15 1502 11/27/15 2326 11/28/15 0749  GLUCAP 108* 114* 128* 134* 109*    Microbiology: Results for orders placed or performed during the hospital encounter of 08/10/2015  Blood Culture (routine x 2)     Status: None   Collection Time: 08/11/2015  8:51 AM  Result Value Ref Range Status   Specimen Description BLOOD [name]  Final   Special Requests BOTTLES DRAWN AEROBIC AND ANAEROBIC  3CC  Final   Culture NO GROWTH 5 DAYS  Final   Report Status 08/17/2015 FINAL  Final  Blood Culture (routine x 2)     Status: None   Collection Time: 07/30/2015  9:20 AM  Result Value Ref Range Status   Specimen Description BLOOD LEFT ARM  Final  Special Requests BOTTLES DRAWN AEROBIC AND ANAEROBIC  1CC  Final   Culture NO GROWTH 5 DAYS  Final   Report Status 08/17/2015 FINAL  Final  Wound culture     Status: None   Collection Time: 07/26/2015  9:20 AM  Result Value Ref Range Status   Specimen Description DECUBITIS  Final   Special Requests Normal  Final   Gram Stain   Final    FEW WBC SEEN MANY GRAM NEGATIVE RODS RARE GRAM POSITIVE COCCI    Culture   Final    HEAVY GROWTH ESCHERICHIA COLI MODERATE GROWTH ENTEROBACTER AEROGENES PROTEUS MIRABILIS HEAVY GROWTH ENTEROCOCCUS SPECIES VRE HAVE INTRINSIC RESISTANCE TO MOST COMMONLY USED ANTIBIOTICS AND THE ABILITY TO ACQUIRE RESISTANCE TO MOST AVAILABLE ANTIBIOTICS.    Report Status 08/16/2015 FINAL  Final   Organism ID, Bacteria ESCHERICHIA COLI  Final   Organism ID, Bacteria ENTEROBACTER AEROGENES  Final   Organism ID, Bacteria PROTEUS MIRABILIS  Final    Organism ID, Bacteria ENTEROCOCCUS SPECIES  Final      Susceptibility   Enterobacter aerogenes - MIC*    CEFTAZIDIME <=1 SENSITIVE Sensitive     CEFAZOLIN >=64 RESISTANT Resistant     CEFTRIAXONE <=1 SENSITIVE Sensitive     CIPROFLOXACIN <=0.25 SENSITIVE Sensitive     GENTAMICIN <=1 SENSITIVE Sensitive     IMIPENEM 1 SENSITIVE Sensitive     TRIMETH/SULFA <=20 SENSITIVE Sensitive     * MODERATE GROWTH ENTEROBACTER AEROGENES   Escherichia coli - MIC*    AMPICILLIN <=2 SENSITIVE Sensitive     CEFTAZIDIME <=1 SENSITIVE Sensitive     CEFAZOLIN <=4 SENSITIVE Sensitive     CEFTRIAXONE <=1 SENSITIVE Sensitive     CIPROFLOXACIN <=0.25 SENSITIVE Sensitive     GENTAMICIN <=1 SENSITIVE Sensitive     IMIPENEM <=0.25 SENSITIVE Sensitive     TRIMETH/SULFA <=20 SENSITIVE Sensitive     * HEAVY GROWTH ESCHERICHIA COLI   Proteus mirabilis - MIC*    AMPICILLIN >=32 RESISTANT Resistant     CEFTAZIDIME <=1 SENSITIVE Sensitive     CEFAZOLIN 8 SENSITIVE Sensitive     CEFTRIAXONE <=1 SENSITIVE Sensitive     CIPROFLOXACIN <=0.25 SENSITIVE Sensitive     GENTAMICIN <=1 SENSITIVE Sensitive     IMIPENEM 1 SENSITIVE Sensitive     TRIMETH/SULFA <=20 SENSITIVE Sensitive     * PROTEUS MIRABILIS   Enterococcus species - MIC*    AMPICILLIN >=32 RESISTANT Resistant     VANCOMYCIN >=32 RESISTANT Resistant     GENTAMICIN SYNERGY SENSITIVE Sensitive     TETRACYCLINE Value in next row Resistant      RESISTANT>=16    * HEAVY GROWTH ENTEROCOCCUS SPECIES  MRSA PCR Screening     Status: None   Collection Time: 08/11/2015  2:38 PM  Result Value Ref Range Status   MRSA by PCR NEGATIVE NEGATIVE Final    Comment:        The GeneXpert MRSA Assay (FDA approved for NASAL specimens only), is one component of a comprehensive MRSA colonization surveillance program. It is not intended to diagnose MRSA infection nor to guide or monitor treatment for MRSA infections.   Blood culture (single)     Status: None    Collection Time: 07/27/2015  3:36 PM  Result Value Ref Range Status   Specimen Description BLOOD RIGHT ASSIST CONTROL  Final   Special Requests BOTTLES DRAWN AEROBIC AND ANAEROBIC  Final   Culture NO GROWTH 5 DAYS  Final   Report Status 08/17/2015  FINAL  Final  Wound culture     Status: None   Collection Time: 08/13/15 12:37 PM  Result Value Ref Range Status   Specimen Description WOUND  Final   Special Requests Normal  Final   Gram Stain   Final    FEW WBC SEEN TOO NUMEROUS TO COUNT GRAM NEGATIVE RODS FEW GRAM POSITIVE COCCI    Culture   Final    HEAVY GROWTH ESCHERICHIA COLI MODERATE GROWTH PROTEUS MIRABILIS LIGHT GROWTH KLEBSIELLA PNEUMONIAE MODERATE GROWTH ENTEROCOCCUS GALLINARUM CRITICAL RESULT CALLED TO, READ BACK BY AND VERIFIED WITH: [facility] BORBA AT 1042 08/16/15 DV    Report Status 08/17/2015 FINAL  Final   Organism ID, Bacteria ESCHERICHIA COLI  Final   Organism ID, Bacteria PROTEUS MIRABILIS  Final   Organism ID, Bacteria KLEBSIELLA PNEUMONIAE  Final   Organism ID, Bacteria ENTEROCOCCUS GALLINARUM  Final      Susceptibility   Escherichia coli - MIC*    AMPICILLIN >=32 RESISTANT Resistant     CEFTAZIDIME 4 RESISTANT Resistant     CEFAZOLIN >=64 RESISTANT Resistant     CEFTRIAXONE 16 RESISTANT Resistant     GENTAMICIN 2 SENSITIVE Sensitive     IMIPENEM >=16 RESISTANT Resistant     TRIMETH/SULFA <=20 SENSITIVE Sensitive     Extended ESBL POSITIVE Resistant     PIP/TAZO Value in next row Resistant      RESISTANT>=128    CIPROFLOXACIN Value in next row Sensitive      SENSITIVE<=0.25    * HEAVY GROWTH ESCHERICHIA COLI   Klebsiella pneumoniae - MIC*    AMPICILLIN Value in next row Resistant      SENSITIVE<=0.25    CEFTAZIDIME Value in next row Resistant      SENSITIVE<=0.25    CEFAZOLIN Value in next row Resistant      SENSITIVE<=0.25    CEFTRIAXONE Value in next row Resistant      SENSITIVE<=0.25    CIPROFLOXACIN Value in next row Resistant       SENSITIVE<=0.25    GENTAMICIN Value in next row Sensitive      SENSITIVE<=0.25    IMIPENEM Value in next row Resistant      SENSITIVE<=0.25    TRIMETH/SULFA Value in next row Resistant      SENSITIVE<=0.25    PIP/TAZO Value in next row Resistant      RESISTANT>=128    * LIGHT GROWTH KLEBSIELLA PNEUMONIAE   Proteus mirabilis - MIC*    AMPICILLIN Value in next row Resistant      RESISTANT>=128    CEFTAZIDIME Value in next row Sensitive      RESISTANT>=128    CEFAZOLIN Value in next row Sensitive      RESISTANT>=128    CEFTRIAXONE Value in next row Sensitive      RESISTANT>=128    CIPROFLOXACIN Value in next row Sensitive      RESISTANT>=128    GENTAMICIN Value in next row Sensitive      RESISTANT>=128    IMIPENEM Value in next row Sensitive      RESISTANT>=128    TRIMETH/SULFA Value in next row Sensitive      RESISTANT>=128    PIP/TAZO Value in next row Sensitive      SENSITIVE<=4    * MODERATE GROWTH PROTEUS MIRABILIS   Enterococcus gallinarum - MIC*    AMPICILLIN Value in next row Resistant      SENSITIVE<=4    GENTAMICIN SYNERGY Value in next row Sensitive      SENSITIVE<=4  CIPROFLOXACIN Value in next row Resistant      RESISTANT>=8    TETRACYCLINE Value in next row Resistant      RESISTANT>=16    * MODERATE GROWTH ENTEROCOCCUS GALLINARUM  Wound culture     Status: None   Collection Time: 08/15/15  2:53 PM  Result Value Ref Range Status   Specimen Description WOUND  Final   Special Requests NONE  Final   Gram Stain FEW WBC SEEN NO ORGANISMS SEEN   Final   Culture NO GROWTH 3 DAYS  Final   Report Status 08/18/2015 FINAL  Final  C difficile quick scan w PCR reflex     Status: None   Collection Time: 08/17/15 11:34 AM  Result Value Ref Range Status   C Diff antigen NEGATIVE NEGATIVE Final   C Diff toxin NEGATIVE NEGATIVE Final   C Diff interpretation Negative for C. difficile  Final  Stool culture     Status: None   Collection Time: 09/03/15  3:51 PM  Result  Value Ref Range Status   Specimen Description STOOL  Final   Special Requests Immunocompromised  Final   Culture   Final    NO SALMONELLA OR SHIGELLA ISOLATED No Pathogenic E. coli detected NO CAMPYLOBACTER DETECTED    Report Status 09/07/2015 FINAL  Final  C difficile quick scan w PCR reflex     Status: None   Collection Time: 09/13/15 12:51 AM  Result Value Ref Range Status   C Diff antigen NEGATIVE NEGATIVE Final   C Diff toxin NEGATIVE NEGATIVE Final   C Diff interpretation Negative for C. difficile  Final  Culture, blood (routine x 2)     Status: None   Collection Time: 09/16/15 11:24 AM  Result Value Ref Range Status   Specimen Description BLOOD LEFT HAND  Final   Special Requests BOTTLES DRAWN AEROBIC AND ANAEROBIC  1CC  Final   Culture NO GROWTH 5 DAYS  Final   Report Status 09/21/2015 FINAL  Final  Culture, blood (routine x 2)     Status: None   Collection Time: 09/16/15 12:23 PM  Result Value Ref Range Status   Specimen Description BLOOD RIGHT HAND  Final   Special Requests BOTTLES DRAWN AEROBIC AND ANAEROBIC  1CC  Final   Culture  Setup Time   Final    GRAM POSITIVE COCCI AEROBIC BOTTLE ONLY CRITICAL RESULT CALLED TO, READ BACK BY AND VERIFIED WITH: TESS THOMAS,RN 09/17/2015 0631 BY JRS.    Culture   Final    ENTEROCOCCUS FAECALIS AEROBIC BOTTLE ONLY VRE HAVE INTRINSIC RESISTANCE TO MOST COMMONLY USED ANTIBIOTICS AND THE ABILITY TO ACQUIRE RESISTANCE TO MOST AVAILABLE ANTIBIOTICS. CRITICAL RESULT CALLED TO, READ BACK BY AND VERIFIED WITH: CHERYL SMITH AT 1610 09/19/15 DV    Report Status 09/21/2015 FINAL  Final   Organism ID, Bacteria ENTEROCOCCUS FAECALIS  Final      Susceptibility   Enterococcus faecalis - MIC*    AMPICILLIN <=2 SENSITIVE Sensitive     LINEZOLID 2 SENSITIVE Sensitive     CIPROFLOXACIN Value in next row Resistant      RESISTANT>=8    TETRACYCLINE Value in next row Resistant      RESISTANT>=16    VANCOMYCIN Value in next row Resistant       RESISTANT>=32    GENTAMICIN SYNERGY Value in next row Resistant      RESISTANT>=32    * ENTEROCOCCUS FAECALIS  Culture, respiratory (NON-Expectorated)     Status: None  Collection Time: 09/16/15  3:50 PM  Result Value Ref Range Status   Specimen Description TRACHEAL ASPIRATE  Final   Special Requests Immunocompromised  Final   Gram Stain   Final    FEW WBC SEEN GOOD SPECIMEN - 80-90% WBCS RARE GRAM NEGATIVE RODS    Culture LIGHT GROWTH PSEUDOMONAS AERUGINOSA  Final   Report Status 09/19/2015 FINAL  Final   Organism ID, Bacteria PSEUDOMONAS AERUGINOSA  Final      Susceptibility   Pseudomonas aeruginosa - MIC*    CEFTAZIDIME 8 SENSITIVE Sensitive     CIPROFLOXACIN 2 INTERMEDIATE Intermediate     GENTAMICIN >=16 RESISTANT Resistant     IMIPENEM >=16 RESISTANT Resistant     PIP/TAZO Value in next row Sensitive      SENSITIVE32    CEFEPIME Value in next row Sensitive      SENSITIVE8    LEVOFLOXACIN Value in next row Resistant      RESISTANT>=8    * LIGHT GROWTH PSEUDOMONAS AERUGINOSA  Culture, expectorated sputum-assessment     Status: None   Collection Time: 09/22/15  2:09 PM  Result Value Ref Range Status   Specimen Description ENDOTRACHEAL  Final   Special Requests Normal  Final   Sputum evaluation THIS SPECIMEN IS ACCEPTABLE FOR SPUTUM CULTURE  Final   Report Status 09/24/2015 FINAL  Final  Culture, respiratory (NON-Expectorated)     Status: None   Collection Time: 09/22/15  2:09 PM  Result Value Ref Range Status   Specimen Description ENDOTRACHEAL  Final   Special Requests Normal Reflexed from [id number]  Final   Gram Stain   Final    FEW WBC SEEN FEW GRAM NEGATIVE RODS POOR SPECIMEN - LESS THAN 70% WBCS    Culture   Final    MODERATE GROWTH PSEUDOMONAS AERUGINOSA LIGHT GROWTH KLEBSIELLA PNEUMONIAE REFER TO SENSITIVITIES FROM PREVIOUS CULTURE FOR ORG 2    Report Status 10/01/2015 FINAL  Final   Organism ID, Bacteria PSEUDOMONAS AERUGINOSA  Final   Organism ID,  Bacteria KLEBSIELLA PNEUMONIAE  Final      Susceptibility   Pseudomonas aeruginosa - MIC*    CEFTAZIDIME 4 SENSITIVE Sensitive     CIPROFLOXACIN 2 INTERMEDIATE Intermediate     GENTAMICIN >=16 RESISTANT Resistant     IMIPENEM >=16 RESISTANT Resistant     PIP/TAZO Value in next row Sensitive      SENSITIVE16    * MODERATE GROWTH PSEUDOMONAS AERUGINOSA  Tissue culture     Status: None   Collection Time: 09/24/15  6:44 AM  Result Value Ref Range Status   Specimen Description BONE  Final   Special Requests Normal  Final   Gram Stain MODERATE WBC SEEN FEW GRAM NEGATIVE RODS   Final   Culture   Final    MODERATE GROWTH ESCHERICHIA COLI MODERATE GROWTH KLEBSIELLA PNEUMONIAE ESBL-EXTENDED SPECTRUM BETA LACTAMASE-THE ORGANISM IS RESISTANT TO PENICILLINS, CEPHALOSPORINS AND AZTREONAM ACCORDING TO CLSI M100-S15 VOL.25 N01 JAN 2005. ORGANISM 1 This organism isolate is resistant to one or more antiotic agents in three or more antimicrobial categories.  Suggest Infectious Disease consult.   ORGANISM 2 CRITICAL RESULT CALLED TO, READ BACK BY AND VERIFIED WITH: RN Mardene Celeste LINDSAY 09/27/15 1005AM    Report Status 09/28/2015 FINAL  Final   Organism ID, Bacteria ESCHERICHIA COLI  Final   Organism ID, Bacteria KLEBSIELLA PNEUMONIAE  Final      Susceptibility   Escherichia coli - MIC*    AMPICILLIN >=32 RESISTANT Resistant  CEFTAZIDIME 4 RESISTANT Resistant     CEFAZOLIN >=64 RESISTANT Resistant     CEFTRIAXONE 8 RESISTANT Resistant     GENTAMICIN <=1 SENSITIVE Sensitive     IMIPENEM 8 RESISTANT Resistant     TRIMETH/SULFA <=20 SENSITIVE Sensitive     Extended ESBL POSITIVE Resistant     PIP/TAZO Value in next row Resistant      RESISTANT>=128    * MODERATE GROWTH ESCHERICHIA COLI   Klebsiella pneumoniae - MIC*    AMPICILLIN Value in next row Resistant      RESISTANT>=128    CEFTAZIDIME Value in next row Resistant      RESISTANT>=128    CEFAZOLIN Value in next row Resistant       RESISTANT>=128    CEFTRIAXONE Value in next row Resistant      RESISTANT>=128    CIPROFLOXACIN Value in next row Resistant      RESISTANT>=128    GENTAMICIN Value in next row Resistant      RESISTANT>=128    IMIPENEM Value in next row Resistant      RESISTANT>=128    TRIMETH/SULFA Value in next row Sensitive      RESISTANT>=128    PIP/TAZO Value in next row Resistant      RESISTANT>=128    * MODERATE GROWTH KLEBSIELLA PNEUMONIAE  Anaerobic culture     Status: None   Collection Time: 09/24/15  3:55 PM  Result Value Ref Range Status   Specimen Description BONE  Final   Special Requests Normal  Final   Culture NO ANAEROBES ISOLATED  Final   Report Status 09/29/2015 FINAL  Final  Culture, fungus without smear     Status: None   Collection Time: 09/24/15  3:55 PM  Result Value Ref Range Status   Specimen Description BONE  Final   Special Requests Normal  Final   Culture NO FUNGUS ISOLATED AFTER 24 DAYS  Final   Report Status 10/18/2015 FINAL  Final  C difficile quick scan w PCR reflex     Status: None   Collection Time: 10/07/15  2:02 PM  Result Value Ref Range Status   C Diff antigen NEGATIVE NEGATIVE Final   C Diff toxin NEGATIVE NEGATIVE Final   C Diff interpretation Negative for C. difficile  Final  Culture, blood (routine x 2)     Status: None   Collection Time: 10/16/15  9:52 AM  Result Value Ref Range Status   Specimen Description BLOOD LEFT HAND  Final   Special Requests   Final    BOTTLES DRAWN AEROBIC AND ANAEROBIC  AER 6CC ANA 4CC   Culture NO GROWTH 6 DAYS  Final   Report Status 10/22/2015 FINAL  Final  Culture, blood (routine x 2)     Status: None   Collection Time: 10/16/15 12:25 PM  Result Value Ref Range Status   Specimen Description BLOOD LEFT HAND  Final   Special Requests BOTTLES DRAWN AEROBIC AND ANAEROBIC  5CC  Final   Culture NO GROWTH 6 DAYS  Final   Report Status 10/22/2015 FINAL  Final  Culture, expectorated sputum-assessment     Status: None    Collection Time: 10/18/15  1:15 PM  Result Value Ref Range Status   Specimen Description SPUTUM  Final   Special Requests NONE  Final   Sputum evaluation THIS SPECIMEN IS ACCEPTABLE FOR SPUTUM CULTURE  Final   Report Status 10/18/2015 FINAL  Final  Culture, respiratory (NON-Expectorated)     Status: None  Collection Time: 10/18/15  1:15 PM  Result Value Ref Range Status   Specimen Description SPUTUM  Final   Special Requests NONE Reflexed from [id number]  Final   Gram Stain   Final    GOOD SPECIMEN - 80-90% WBCS MODERATE WBC SEEN MANY GRAM NEGATIVE RODS    Culture   Final    HEAVY GROWTH PSEUDOMONAS AERUGINOSA MODERATE GROWTH KLEBSIELLA PNEUMONIAE CRITICAL RESULT CALLED TO, READ BACK BY AND VERIFIED WITHRod Mae AT 1610 10/21/15 DV KLEBSIELLA PNEUMONIAE This organism isolate is resistant to one or more antiotic agents in three or more antimicrobial categories.  Suggest Infectious Disease  consult.      Report Status 10/22/2015 FINAL  Final   Organism ID, Bacteria PSEUDOMONAS AERUGINOSA  Final   Organism ID, Bacteria KLEBSIELLA PNEUMONIAE  Final      Susceptibility   Klebsiella pneumoniae - MIC*    AMPICILLIN >=32 RESISTANT Resistant     CEFAZOLIN >=64 RESISTANT Resistant     CEFTRIAXONE >=64 RESISTANT Resistant     CIPROFLOXACIN >=4 RESISTANT Resistant     GENTAMICIN >=16 RESISTANT Resistant     IMIPENEM >=16 RESISTANT Resistant     NITROFURANTOIN >=512 RESISTANT Resistant     TRIMETH/SULFA <=20 SENSITIVE Sensitive     * MODERATE GROWTH KLEBSIELLA PNEUMONIAE   Pseudomonas aeruginosa - MIC*    CEFTAZIDIME 4 SENSITIVE Sensitive     CIPROFLOXACIN >=4 RESISTANT Resistant     GENTAMICIN 8 INTERMEDIATE Intermediate     IMIPENEM >=16 RESISTANT Resistant     PIP/TAZO Value in next row Sensitive      SENSITIVE32    * HEAVY GROWTH PSEUDOMONAS AERUGINOSA  C difficile quick scan w PCR reflex     Status: None   Collection Time: 10/18/15  4:39 PM  Result Value Ref Range Status    C Diff antigen NEGATIVE NEGATIVE Final   C Diff toxin NEGATIVE NEGATIVE Final   C Diff interpretation Negative for C. difficile  Final  Culture, blood (Routine X 2) w Reflex to ID Panel     Status: None   Collection Time: 11/06/15  8:27 AM  Result Value Ref Range Status   Specimen Description BLOOD RIGHT HAND  Final   Special Requests BOTTLES DRAWN AEROBIC AND ANAEROBIC 3CC  Final   Culture NO GROWTH 5 DAYS  Final   Report Status 11/11/2015 FINAL  Final  Culture, blood (Routine X 2) w Reflex to ID Panel     Status: None   Collection Time: 11/06/15  8:29 AM  Result Value Ref Range Status   Specimen Description BLOOD LEFT HAND  Final   Special Requests BOTTLES DRAWN AEROBIC AND ANAEROBIC  1CC  Final   Culture NO GROWTH 5 DAYS  Final   Report Status 11/11/2015 FINAL  Final  Culture, expectorated sputum-assessment     Status: None   Collection Time: 11/06/15  8:41 AM  Result Value Ref Range Status   Specimen Description EXPECTORATED SPUTUM  Final   Special Requests Immunocompromised  Final   Sputum evaluation THIS SPECIMEN IS ACCEPTABLE FOR SPUTUM CULTURE  Final   Report Status 11/06/2015 FINAL  Final  Culture, respiratory (NON-Expectorated)     Status: None   Collection Time: 11/06/15  8:41 AM  Result Value Ref Range Status   Specimen Description EXPECTORATED SPUTUM  Final   Special Requests Immunocompromised Reflexed from [id number]  Final   Gram Stain   Final    MANY WBC SEEN MANY GRAM NEGATIVE RODS EXCELLENT SPECIMEN -  90-100% WBCS    Culture   Final    MODERATE GROWTH KLEBSIELLA PNEUMONIAE MODERATE GROWTH PSEUDOMONAS AERUGINOSA CRITICAL RESULT CALLED TO, READ BACK BY AND VERIFIED WITH: DR. FITZGERALD AT 1120 11/09/15 DV DR. FITZGERALD REQUESTED ORGANISM BE SENT OUT FOR TIGECYCLINE CEFT/AVIBACTAM AND COLISTIN Sent to [facility] for further susceptibility testing. 11/11/15 CTJ    Report Status 11/15/2015 FINAL  Final   Organism ID, Bacteria KLEBSIELLA PNEUMONIAE  Final   Organism  ID, Bacteria PSEUDOMONAS AERUGINOSA  Final      Susceptibility   Klebsiella pneumoniae - MIC*    AMPICILLIN >=32 RESISTANT Resistant     CEFAZOLIN >=64 RESISTANT Resistant     CEFTRIAXONE 32 RESISTANT Resistant     CIPROFLOXACIN >=4 RESISTANT Resistant     GENTAMICIN >=16 RESISTANT Resistant     IMIPENEM >=16 RESISTANT Resistant     NITROFURANTOIN 256 RESISTANT Resistant     TRIMETH/SULFA >=320 RESISTANT Resistant     Extended ESBL POSITIVE Resistant     * MODERATE GROWTH KLEBSIELLA PNEUMONIAE   Pseudomonas aeruginosa - MIC*    CEFTAZIDIME >=64 RESISTANT Resistant     CIPROFLOXACIN 2 INTERMEDIATE Intermediate     GENTAMICIN >=16 RESISTANT Resistant     IMIPENEM >=16 RESISTANT Resistant     * MODERATE GROWTH PSEUDOMONAS AERUGINOSA  Culture, respiratory (NON-Expectorated)     Status: None   Collection Time: 11/14/15  2:07 PM  Result Value Ref Range Status   Specimen Description TRACHEAL ASPIRATE  Final   Special Requests NONE  Final   Gram Stain   Final    EXCELLENT SPECIMEN - 90-100% WBCS MANY WBC SEEN MODERATE GRAM NEGATIVE RODS    Culture   Final    HEAVY GROWTH PSEUDOMONAS AERUGINOSA CRITICAL VALUE NOTED.  VALUE IS CONSISTENT WITH PREVIOUSLY REPORTED AND CALLED VALUE.    Report Status 11/16/2015 FINAL  Final   Organism ID, Bacteria PSEUDOMONAS AERUGINOSA  Final      Susceptibility   Pseudomonas aeruginosa - MIC*    CEFTAZIDIME >=64 RESISTANT Resistant     CIPROFLOXACIN 2 INTERMEDIATE Intermediate     GENTAMICIN >=16 RESISTANT Resistant     IMIPENEM >=16 RESISTANT Resistant     PIP/TAZO Value in next row Resistant      RESISTANT>=128    TOBRAMYCIN Value in next row Sensitive      SENSITIVE2    * HEAVY GROWTH PSEUDOMONAS AERUGINOSA    Coagulation Studies: No results for input(s): LABPROT, INR in the last 72 hours.  Urinalysis: No results for input(s): COLORURINE, LABSPEC, PHURINE, GLUCOSEU, HGBUR, BILIRUBINUR, KETONESUR, PROTEINUR, UROBILINOGEN, NITRITE,  LEUKOCYTESUR in the last 72 hours.  Invalid input(s): APPERANCEUR    Imaging: No results found.   Medications:   . dextrose 25 mL/hr at 11/28/15 0800  . feeding supplement (VITAL AF 1.2 CAL) 1,000 mL (11/28/15 0800)   . albumin human  12.5 g Intravenous Once  . albumin human  12.5 g Intravenous Once  . albuterol  2.5 mg Nebulization Q6H  . amiodarone  100 mg Per Tube Daily  . antiseptic oral rinse  7 mL Mouth Rinse QID  . aspirin  81 mg Per Tube Daily  . chlorhexidine gluconate  15 mL Mouth Rinse BID  . colistimethate (COLISTIN) IV  250 mg Intravenous Q48H  . epoetin (EPOGEN/PROCRIT) injection  10,000 Units Intravenous Q M,W,F-HD  . erythromycin  200 mg Intravenous 3 times per day  . escitalopram  10 mg Per Tube Daily  . feeding supplement (PRO-STAT SUGAR FREE 64)  30 mL Per Tube 6 X Daily  . free water  20 mL Per Tube 6 times per day  . heparin subcutaneous  5,000 Units Subcutaneous Q12H  . hydrocortisone  10 mg Per Tube BID  . lidocaine  1 patch Transdermal Q24H  . midodrine  10 mg Per Tube TID WC  . mirtazapine  7.5 mg Per Tube QHS  . multivitamin  5 mL Per Tube Daily  . pantoprazole sodium  40 mg Per Tube Daily  . sodium chloride  10-40 mL Intracatheter Q12H  . sodium hypochlorite   Irrigation BID  . tigecycline (TYGACIL) IVPB  50 mg Intravenous Q12H  . tobramycin (PF)  300 mg Nebulization BID   acetaminophen (TYLENOL) oral liquid 160 mg/5 mL, albuterol, HYDROmorphone (DILAUDID) injection, loperamide, ondansetron (ZOFRAN) IV, oxyCODONE, polyvinyl alcohol, sodium chloride, zinc oxide  Assessment/ Plan:  50 y.o. 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 Admitted on 08/30/15  Echo: 11/07/19: EF of 30% to 35%.   1. End-stage renal disease on hemodialysis on HD MWF. The patient has been on dialysis since October of 2014. R IJ permcath.  - Dialysis treatment with IV albumin for oncotic support. Slow BFR   - Midodrine and hydrocortisone ordered for blood pressure support.  - Continue MWF schedule, monitor daily for dailysis treatment.  - Patient is doing poorly with IHD. - In my opinion she will not be able to get well enough for an outpatient dialysis placement  2. Anemia of CKD. Hemoglobin 9.1. Multiple transfusions during admission most recently on 1/1 - Epogen and periodic blood transfusions. Not a candidate for IV iron.   3. Acute resp failure: ventilator dependent -prolonged course on ventilator, fio2 currently 40% with PEEP of 5.    4. Sepsis: pseudomonas in tracheal aspirate - tobramycin inhaled  - tigecycline, erythromycin    5. Secondary Hyperparathyroidism: PTH 50, phos 5.2 - not currently on a binder or vitamin D agent.     LOS: 108 Almendra Loria 1/9/20179:40 AM

## 2015-11-28 NOTE — Progress Notes (Signed)
Tegecycline not given at 1800 as pt was receiving hemodialysis.  Reported off to Tess (night rn) and Tess will give med when HD is finished.

## 2015-11-28 NOTE — Progress Notes (Signed)
Pt is lethargic, does arouse to voice and follows commands.  NSR with PVC's.  LUngs rhonchi.  Tolerating tube feedings at 30 ml/hr, two bowel movements this shift. Hypothermic this morning (67F), warming blanket applied at that time and has been in place all day, temp with blanket is 97.8.  Pt started on levophed in order for pt to tolerate HD as pt BP was high 80's to low 90's.  Anuric.  HD currently running.

## 2015-11-28 NOTE — Progress Notes (Signed)
PULMONARY / CRITICAL CARE MEDICINE   Name: Courtney [name] MRN: [id number] DOB: 1965-12-24    ADMISSION DATE:  08/09/2015  BRIEF HISTORY: 49 AAF who has been in medical facilities (hosp, LTAC, rehab) for 2 yrs following gastric bypass surgery with multiple complications. Now with chronic trach, ESRD, profound debilitation, severe sacral pressure ulcer. Was seemingly making progress and transferred to rehab facility approx one week prior to this admission. She was sent to [facility] ED with AMS and hypotension. Working dx of severe sepsis/septic shock due to infected sacral pressure ulcer. Since admission. Her course is been very complicated with numerous complications including septic shock and GI bleeding. Now with failure to wean from vent and possible MI event, repeat ECHO EF 30-35, possible old MI, severe hypokinesis of the nnterior and anteroseptal myocardium   SUBJ: lethargic today, she cannot provide a history, she is arousable to physical stimulation. Placed on bear hugger for hypothermia Remains on vent-unable to wean  Summary of MAJOR EVENTS/TEST RESULTS: Admission 02/07/14-05/07/14 Admission 07/21/14-09/06/14 Discharged to [facility]. Pt had palliative consult at that time, were asked to sign off by husband.  09/23 CT head: NAD 09/23 EEG: no epileptiform activity 09/23 PRBCs for Hgb 6.4 09/24 bedside debridement of sacral wound. Abscess drained 09/25 Off vasopressors. More alert. No distress. Worsening thrombocytopenia. Vanc DC'd 09/29 Dr. Sampson Goon (I.D) excused from the case by patient's husband. 10/02 MRI -multiple infarcts 10/03 tracheal bleeding- transfused platelets 10/03 hospitalist service excused from the case by patient's husband 10/03 Echocardiogram ejection fraction was 55-60%, pulmonary systolic pressure was 39 mmHg 40/98 restart TF's at lower rate, attempt reg HD  10/12 Transferred to med-surg floor. Remains on PCCM service 10/14 SLP eval: pt unable to tolerate PMV  adequately 10/17-will re-attempt PM valve-discussed with Speech therapist 10/18 passed swallow eval-start pureed thick foods no thin liquids-continue NG feeds 10/19 transferred to step down for sepsis/aspiration pneumonia 10/19 cxr shows RLL opacity 10/21 sacral decub debride at bedside by surgery 10/22 started back on vasopressors while on HD 10/27 CT with osteo, R hip fx, unable to identify tip of dubhoff tube - sent for fluoro study 10/28 Ortho consultation: I do not feel that she is a surgical candidate. Therefore, I feel that it would best to manage this fracture nonsurgically and allow it to heal by itself over time, which it should.  10/28 Gen Surg consultation: Due to the lack of free air or free flow of contrast and the peritoneum there is no indication for any surgical intervention on this. Would recommend pulling back the feeding tube 1-2 cm. Would recheck an abdominal film to confirm no pneumoperitoneum in the morning. Absence of any changes okay to continue using Dobbhoff for feeding and medications. 10/28 gastrograffin study: The study confirms that the feeding tube pes perforated through the duodenum and the tip is within a cavity that fills with injected contrast. The cavity does appear walled off 11/4 refuses oral feeds, continue TF's CT reviewed: T8-T9 discitis/osteomyelitis, RT HIP FRACTURE 11/5 placed back on vasopressors, placed back on Vent due to resp acidosis, levophed turned off 11/6 afternoon 11/5 PM Trach changed out due to cuff leak, #6 Shiley cuffed, 11/6 dubhoff occluded, removed and replaced, new dubhoff shows tube in the antral stomach 11/15 refusing to take oral feeds 11/18 placed back on Vent for increased WOB,SOB. 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 10/18/2015; the patient developed a new left basal pneumonia;  with recurrent sepsis.  10/19/2015; fevers resolved with IV  acetaminophen. Tube feeds changed from vital to Jevity.  12/7 CXR with stable b/l opacities.  12/12 elevated K s/p HD-remains on AC mode 12/16-vomiting-hold feeds and re-assess in next 24 hrs 12/17- TF restarted at 1/2 goal (goal = 50cc/per) 12/18- elevated wbc, fever (101.4), recultured, restarted on ceftaz 12/19 Pericardial friction rub. Echocardiogram: LVEF 30-35%, anteroseptal hypokinesis or akinesis c/w anterior wall MI 12/20 Dr Sung AmabileSimonds discussed new echocardiogram findings with pt's husband and explained that this likely represents an anterior wall MI sometime in the previous couple of weeks. It was explained that she is not a candidate for any cardiology intervention. Code status was again discussed. Husband expressed understanding that she was not going to survive this illness in any favorable way but continues to request that she be full code status based on what he believes to be her wishes 12/21 vomitied 50cc of TF - AM TF held, restart TF at 3pm @30cc  12/26 Persistent intermittent trach cuff leak. Distal XLT trach requested.  12/27 15 beat NSVT 12/28 patient refused HD and refused Bath time 12/29 D10 infusion for hypoglycemia, TF's on hold due to intolerance 12/30 CODE BLUE  VTACH 01/01 transfuse 2 units of blood 01/02 increased respiratory distress, copious purulent secretions 01/02 LE venous US: No DVT 01/03 Repeat TTE: The cavity size was mildly dilated. Systolic function was moderately to severely reduced. The estimated ejection fraction was in the range of 30% to 35%. Diffuse hypokinesis. Moderate to severe hypokinesis of the anteroseptal, anterior, and apical myocardium. Hypokinesis of the anterior myocardium 01/05 Hypothermic - Bair Hugger ordered  INDWELLING DEVICES:: Trach (chronic) placed June 2014 Tunneled R IJ HD cath (chronic) Tunneled L IJ CVL (chronic) L femoral A-line 9/23 >> 9/25  MICRO DATA: History of carbopenem resistant enterococcus and recurrent c.  diff from previous hospitalizations. 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 Pneumoniae, P. Mirabilis, VRE Wound 9/26 >> No growth Blood 9/23 >> NEG CDiff 9/27>>neg Stool Cx 10/15>> negative Cdiff 10/25>>neg Trach Aspirate 10/28>> light growth pseudomonas Blood 10/28 >> 1/2 GPC >>  Sputum cultures obtained 09/22/15 due to mucus plugging>>Pseudomonas BONE TISSUE Cx 11/3>>E.coli (ESBL), k. Pneumonia (daptomycin and septra) Bld Cx 11/27>>negative thus far.  Trach Asp 11/27>> grew out Pseudomonas, and Klebsiella pneumonia. Trach Asp 12/18>> MDR Klebsiella, Pseudomonas Bld Cx 12/18>> NEG 12/26 resp culture >> MDR pseudomonas  ANTIMICROBIALS:  Aztreonam 9/23 >> 9/24 Vanc 9/23 >> 9/25 Vanc 9/26>>9/27 Daptomycin 9/27>> 10/12, 10/28>> off Meropenem 9/23 >> 10/14, 10/28 >> 10/31 Zosyn 11/27,>> 11/30. Levaquin 11/29>> 11/29. Ceftaz 12/1>>12/11, 12/18>> 12/24 Colisitin 12/26 >>  Tigecycline 12/26 >>  Nebulized tobramycin 12/30 >>  VITAL SIGNS: Temp:  [93.7 F (34.3 C)-97.4 F (36.3 C)] 94.1 F (34.5 C) (01/09 1100) Pulse Rate:  [34-78] 36 (01/09 1300) Resp:  [14-18] 14 (01/09 1300) BP: (80-138)/(46-84) 105/46 mmHg (01/09 1300) SpO2:  [98 %-100 %] 99 % (01/09 1300) FiO2 (%):  [40 %] 40 % (01/09 1100) HEMODYNAMICS:   VENTILATOR SETTINGS: Vent Mode:  [-] PRVC FiO2 (%):  [40 %] 40 % Set Rate:  [14 bmp] 14 bmp Vt Set:  [500 mL] 500 mL PEEP:  [5 cmH20] 5 cmH20 Plateau Pressure:  [31 cmH20] 31 cmH20 INTAKE / OUTPUT:  Intake/Output Summary (Last 24 hours) at 11/28/15 1318 Last data filed at 11/28/15 0600  Gross per 24 hour  Intake   1625  ml  Output      5 ml  Net   1620 ml    Review of Systems  Unable to perform ROS  Physical Exam  RASS 0, intermittent trach tube cuff leak continues today,  HEENT; Cushingoid facies, temporal wasting, NGT present. Tube feeds are  infusing. Trach site clean Diffuse rhonchi Reg with occ extrasystoles, no M Abdomen soft, + BS Ext cool, trace symmetric edema, marked muscle wasting Severe sacral decubitus ulcer- to the spine  LABS:  CBC  Recent Labs Lab 11/25/15 0938 11/27/15 0436 11/28/15 0458  WBC 12.8* 15.7* 15.6*  HGB 9.0* 9.1* 9.1*  HCT 29.2* 29.6* 29.8*  PLT 99* 92* 94*   Coag's  Recent Labs Lab 11/21/15 2012  APTT 41*  INR 1.57   BMET  Recent Labs Lab 11/23/15 1339 11/25/15 0938  NA 131* 133*  K 3.8 3.5  CL 96* 97*  CO2 29 27  BUN 72* 72*  CREATININE 1.45* 1.13*  GLUCOSE 101* 78   Electrolytes  Recent Labs Lab 11/23/15 1339 11/25/15 0938  CALCIUM 8.1* 7.8*  PHOS 5.2* 4.9*   Sepsis Markers No results for input(s): LATICACIDVEN, PROCALCITON, O2SATVEN in the last 168 hours. ABG  Recent Labs Lab 11/21/15 1745  PHART 7.41  PCO2ART 47  PO2ART 78*   Liver Enzymes  Recent Labs Lab 11/23/15 1339 11/25/15 0938  ALBUMIN 1.5* 1.5*   Cardiac Enzymes  Recent Labs Lab 11/21/15 2012 11/22/15 0202 11/22/15 0925  TROPONINI 0.05* 0.16* 0.06*   Glucose  Recent Labs Lab 11/27/15 0444 11/27/15 0741 11/27/15 1209 11/27/15 1502 11/27/15 2326 11/28/15 0749  GLUCAP 111* 108* 114* 128* 134* 109*    Imaging No results found.  ASSESSMENT / PLAN: IMPRESSION: Very prolonged and complicated hospitalization after gastric bypass surgery 2014 Never returned home ESRD Recurrent severe sepsis - PAN resistant Klebisella in sputum Recurrent PNA-now with pseudomonas H/O C diff H/O VTE DM2 - controlled Episodic hypoglycemia Chronic steroid therapy, for a history of of adrenal insufficiency Incidental finding of R hip fx - conservative mgmt. 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 Husband declined TEE Profound deconditioning Chronic pain New finding of pericardial friction rub 12/19 - resolved New finding of cardiomyopathy, likely post MI 12/19  PAF/flutter Sinus bradycardia HCAP with MDR Donalee Citrin and pseudomonas Chronic anemia Severe protein - calorie malnutrition TF intolerance Mild thrombocytopenia 01/06  Cont vent support - settings reviewed and/or adjusted Cont vent bundle Cont HD per renal Monitor BMET intermittently Monitor I/Os Correct electrolytes as indicated DVT px: SQ heparin Monitor CBC intermittently Transfuse per usual guidelines Monitor temp, WBC count Micro and abx as above ID following - appreciate recs Cont Amiodarone - 100 mg per tube daily DC metoprolol Cont opiates PRN Cont D10 Infusion for hypoglycemia TFs @  30 cc/hr   Prognosis is very poor without hope for functional recovery. Patient is dialysis and Vent dependent with severe sacral decub ulcer with chronic bone infection and spinal infection with underlying CHF and CVA with recurrent pneumonia.   This patient in my opinion is deemed Futile. Husband understands but unrealistic of goals of care  Patient with intermittent delirium with chronic sepsis and can not make decisions for herself.   Would recommend DNR status and Comfort care measures  I have personally obtained a history, examined the  patient, evaluated Pertinent laboratory and RadioGraphic/imaging results, and  formulated the assessment and plan   The Patient requires high complexity decision making for assessment and support, frequent evaluation and titration of therapies, application of advanced monitoring technologies and extensive interpretation of multiple databases. Critical Care Time devoted to patient care services described in this note is 35 minutes.   Overall, patient is critically ill, prognosis is guarded.  Patient with Multiorgan failure and at high risk for cardiac  arrest and death.    Lucie Leather, M.D.  [facility]  Medical Director [facility] Medical Director [facility] Cardio-Pulmonary Department

## 2015-11-28 NOTE — Progress Notes (Signed)
Pt bolus is finished, bp now 77/67(73).  Pt is awake and alert.  Notified Dr. Elisabeth PigeonVachhani.  Per Dr Elisabeth PigeonVachhani he will come and see pt and call pt niece (POA) to see if they would like aggressive treatment with vasopressors as pt is DNR.

## 2015-11-29 LAB — GLUCOSE, CAPILLARY
GLUCOSE-CAPILLARY: 84 mg/dL (ref 65–99)
GLUCOSE-CAPILLARY: 95 mg/dL (ref 65–99)
Glucose-Capillary: 115 mg/dL — ABNORMAL HIGH (ref 65–99)

## 2015-11-29 NOTE — Progress Notes (Signed)
On 11/26/2015 the ENT physician stated that he did not want us to place patient on a pillow due to it rubbing under her chin and causing an abrasion. Patient given a bath and her wounds were measured, cleaned, and dressings applied. Patient given pain medication prior to this and she denied being in any discomfort while turning her on side. Will continue to assess and monitor.

## 2015-11-29 NOTE — Progress Notes (Addendum)
Nutrition Follow-up  DOCUMENTATION CODES:   Severe malnutrition in context of acute illness/injury; now progressed to chronic in nature  INTERVENTION:   Coordination of Care: discussed nutritional poc with MD Kasa; pt is severely malnourished with multiple co-morbidities which include recurrent severe sepsis on chronic antibiotic therapies, severe sacral decub with exposed bone, chronic VDRF, ESRD on HD, pt also with chronic diarrhea and hx of gastric sleeve with questionable SIPS procedure; pt now with poor tolerance of TF via dobhoff with TF being maintained at rate of 30 ml/hr per MD order and pt continues to require D10 infusion to prevent hypoglycemia (another indication of poor nutritional status); current TF at current rate provides 864 kcals, 108 g of protein. Pt is receiving additional kcals and protein via Prostat supplementation (protein modular). Recommended goal rate of TF is 60 ml/hr. Pt has been with limited nutrition support since Christmas Day due to periods of vomiting and intolerance to TF.  Again, Husband does not want TPN or permanent feeding tube placed and oral intake is not possible at this time (although unrealistic that pt would be able to meet nutritional needs via oral intake alone at this point). It is also unrealistic that pt can receive TF via dobhoff tube indefinitely. Unlikely that pt will be able to improve medically without adequate nutrition but no further nutritional recommendations at this time; RD will sign off.  RD is available for assistance as needed, may be re-consulted as appropriate. RD is also present in ICU rounds and can provide recommendations as needed but will no longer follow pt. This was discussed with MD Kasa and he is agreeable with this plan   ASSESSMENT:    Pt remains on vent via trach, unable to wean. Pt continues with HD as tolerated but currently on levophed for hypotension, 2L fluid removed yesterday. Pt continues on D10 infusion to maintain  glucose levels. Pt with dobhoff in place for nutrition support since 9/26.   Diet Order:    NPO  EN: Vital 1.2 AF infusing at rate of 30 ml/hr; goal rate in order is 60 ml/hr. Titration to rate of 40 ml/hr had been attempted but pt reporting nausea, feeling like she was going to vomit and TF rate turned back to 30 ml/hr. Pt has continued episodes of nausea/vomiting, although none in several days.  MD has not wanted to titrate TF past 30 ml/hr and no plans to titrate at present  Digestive System: +loose stools  Skin:   (stage IV sacrum, stage III hip, unstageable foot; rectal ulcer from fleixseal, ulcer in nose from dobhoff)  Electrolyte and Renal Profile:  Recent Labs Lab 11/23/15 1339 11/25/15 0938 11/28/15 1348  BUN 72* 72* 102*  CREATININE 1.45* 1.13* 1.45*  NA 131* 133* 129*  K 3.8 3.5 3.8  PHOS 5.2* 4.9* 6.1*   Glucose Profile:  Recent Labs  11/28/15 1611 11/29/15 0023 11/29/15 0728  GLUCAP 93 95 84   Meds: D10 at 25 ml/hr, remeron, MVI, zofran, ;evophed,   Height:   Ht Readings from Last 1 Encounters:  01/25/12 5\' 9"  (1.753 m)    Weight:   Wt Readings from Last 1 Encounters:  11/22/15 224 lb 13.9 oz (102 kg)    Filed Weights   11/21/15 0500 11/21/15 1540 11/22/15 0500  Weight: 235 lb 14.3 oz (107 kg) 235 lb 14.3 oz (107 kg) 224 lb 13.9 oz (102 kg)    BMI:  Body mass index is 33.19 kg/(m^2).  Estimated Nutritional Needs:  Kcal:  1610-9604 kcals (30-35 kcals/kg) using IBW  Protein:  (1.5-2.0 g/kg)145-194 g/d but likely closer to 2.0-2.5 g/kg Using current wt of 97kg  Fluid:  + UOP  EDUCATION NEEDS:   No education needs identified at this time   Signed off.   Romelle Starcher MS, RD, LDN [phone] Pager  [phone] Weekend/On-Call Pager

## 2015-11-29 NOTE — Progress Notes (Signed)
PULMONARY / CRITICAL CARE MEDICINE   Name: Courtney [name] MRN: [id number] DOB: September 12, 1966    ADMISSION DATE:  August 17, 2015  BRIEF HISTORY: 49 AAF who has been in medical facilities (hosp, LTAC, rehab) for 2 yrs following gastric bypass surgery with multiple complications. Now with chronic trach, ESRD, profound debilitation, severe sacral pressure ulcer. Was seemingly making progress and transferred to rehab facility approx one week prior to this admission. She was sent to [facility] ED with AMS and hypotension. Working dx of severe sepsis/septic shock due to infected sacral pressure ulcer. Since admission. Her course is been very complicated with numerous complications including septic shock and GI bleeding. Now with failure to wean from vent and possible MI event, repeat ECHO EF 30-35, possible old MI, severe hypokinesis of the anterior and anteroseptal myocardium   SUBJ: lethargic today, she cannot provide a history, she is arousable to physical stimulation. Placed on bear hugger for hypothermia Remains on vent-unable to wean Placed on vasopressors, guppy breathing Patient given morphine for dressing changes of sacral wound Husband updated and notified of patients clinical condition  Summary of MAJOR EVENTS/TEST RESULTS: Admission 02/07/14-05/07/14 Admission 07/21/14-09/06/14 Discharged to [facility]. Pt had palliative consult at that time, were asked to sign off by husband.  09/23 CT head: NAD 09/23 EEG: no epileptiform activity 09/23 PRBCs for Hgb 6.4 09/24 bedside debridement of sacral wound. Abscess drained 09/25 Off vasopressors. More alert. No distress. Worsening thrombocytopenia. Vanc DC'd 09/29 Dr. Sampson Goon (I.D) excused from the case by patient's husband. 10/02 MRI -multiple infarcts 10/03 tracheal bleeding- transfused platelets 10/03 hospitalist service excused from the case by patient's husband 10/03 Echocardiogram ejection fraction was 55-60%, pulmonary systolic pressure was  39 mmHg 10/08 restart TF's at lower rate, attempt reg HD  10/12 Transferred to med-surg floor. Remains on PCCM service 10/14 SLP eval: pt unable to tolerate PMV adequately 10/17-will re-attempt PM valve-discussed with Speech therapist 10/18 passed swallow eval-start pureed thick foods no thin liquids-continue NG feeds 10/19 transferred to step down for sepsis/aspiration pneumonia 10/19 cxr shows RLL opacity 10/21 sacral decub debride at bedside by surgery 10/22 started back on vasopressors while on HD 10/27 CT with osteo, R hip fx, unable to identify tip of dubhoff tube - sent for fluoro study 10/28 Ortho consultation: I do not feel that she is a surgical candidate. Therefore, I feel that it would best to manage this fracture nonsurgically and allow it to heal by itself over time, which it should.  10/28 Gen Surg consultation: Due to the lack of free air or free flow of contrast and the peritoneum there is no indication for any surgical intervention on this. Would recommend pulling back the feeding tube 1-2 cm. Would recheck an abdominal film to confirm no pneumoperitoneum in the morning. Absence of any changes okay to continue using Dobbhoff for feeding and medications. 10/28 gastrograffin study: The study confirms that the feeding tube pes perforated through the duodenum and the tip is within a cavity that fills with injected contrast. The cavity does appear walled off 11/4 refuses oral feeds, continue TF's CT reviewed: T8-T9 discitis/osteomyelitis, RT HIP FRACTURE 11/5 placed back on vasopressors, placed back on Vent due to resp acidosis, levophed turned off 11/6 afternoon 11/5 PM Trach changed out due to cuff leak, #6 Shiley cuffed, 11/6 dubhoff occluded, removed and replaced, new dubhoff shows tube in the antral stomach 11/15 refusing to take oral feeds 11/18 placed back on Vent for increased WOB,SOB. 11/28 Wound care as re -consulted, there appears to  be a new pocket/fistula around the  area of her stage IV sacral wound. 10/18/2015; the patient developed a new left basal pneumonia; with recurrent sepsis.  10/19/2015; fevers resolved with IV acetaminophen. Tube feeds changed from vital to Jevity.  12/7 CXR with stable b/l opacities.  12/12 elevated K s/p HD-remains on AC mode 12/16-vomiting-hold feeds and re-assess in next 24 hrs 12/17- TF restarted at 1/2 goal (goal = 50cc/per) 12/18- elevated wbc, fever (101.4), recultured, restarted on ceftaz 12/19 Pericardial friction rub. Echocardiogram: LVEF 30-35%, anteroseptal hypokinesis or akinesis c/w anterior wall MI 12/20 Dr Sung AmabileSimonds discussed new echocardiogram findings with pt's husband and explained that this likely represents an anterior wall MI sometime in the previous couple of weeks. It was explained that she is not a candidate for any cardiology intervention. Code status was again discussed. Husband expressed understanding that she was not going to survive this illness in any favorable way but continues to request that she be full code status based on what he believes to be her wishes 12/21 vomitied 50cc of TF - AM TF held, restart TF at 3pm @30cc  12/26 Persistent intermittent trach cuff leak. Distal XLT trach requested.  12/27 15 beat NSVT 12/28 patient refused HD and refused Bath time 12/29 D10 infusion for hypoglycemia, TF's on hold due to intolerance 12/30 CODE BLUE  VTACH 01/01 transfuse 2 units of blood 01/02 increased respiratory distress, copious purulent secretions 01/02 LE venous US: No DVT 01/03 Repeat TTE: The cavity size was mildly dilated. Systolic function was moderately to severely reduced. The estimated ejection fraction was in the range of 30% to 35%. Diffuse hypokinesis. Moderate to severe hypokinesis of the anteroseptal, anterior, and apical myocardium. Hypokinesis of the anterior myocardium 01/05 Hypothermic - Bair Hugger ordered 01/10-placed back on vasopressors for septic shock  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 Pneumoniae, P. Mirabilis, VRE Wound 9/26 >> No growth Blood 9/23 >> NEG CDiff 9/27>>neg Stool Cx 10/15>> negative Cdiff 10/25>>neg Trach Aspirate 10/28>> light growth pseudomonas Blood 10/28 >> 1/2 GPC >>  Sputum cultures obtained 09/22/15 due to mucus plugging>>Pseudomonas BONE TISSUE Cx 11/3>>E.coli (ESBL), k. Pneumonia (daptomycin and septra) Bld Cx 11/27>>negative thus far.  Trach Asp 11/27>> grew out Pseudomonas, and Klebsiella pneumonia. Trach Asp 12/18>> MDR Klebsiella, Pseudomonas Bld Cx 12/18>> NEG 12/26 resp culture >> MDR pseudomonas  ANTIMICROBIALS:  Aztreonam 9/23 >> 9/24 Vanc 9/23 >> 9/25 Vanc 9/26>>9/27 Daptomycin 9/27>> 10/12, 10/28>> off Meropenem 9/23 >> 10/14, 10/28 >> 10/31 Zosyn 11/27,>> 11/30. Levaquin 11/29>> 11/29. Ceftaz 12/1>>12/11, 12/18>> 12/24 Colisitin 12/26 >>  Tigecycline 12/26 >>  Nebulized tobramycin 12/30 >>  VITAL SIGNS: Temp:  [97.4 F (36.3 C)-99.5 F (37.5 C)] 98.2 F (36.8 C) (01/10 1200) Pulse Rate:  [39-85] 73 (01/10 1430) Resp:  [11-23] 15 (01/10 1430) BP: (78-137)/(35-100) 102/56 mmHg (01/10 1430) SpO2:  [96 %-100 %] 100 % (01/10 1430) FiO2 (%):  [40 %] 40 % (01/10 1532) HEMODYNAMICS:   VENTILATOR SETTINGS: Vent Mode:  [-] PRVC FiO2 (%):  [40 %] 40 % Set Rate:  [14 bmp] 14 bmp Vt Set:  [500 mL] 500 mL PEEP:  [5 cmH20] 5 cmH20 Plateau Pressure:  [29 cmH20-33 cmH20]  29 cmH20 INTAKE / OUTPUT:  Intake/Output Summary (Last 24 hours) at 11/29/15 1559 Last data filed at 11/29/15 1400  Gross per 24 hour  Intake 2221.3 ml  Output  -2000  ml  Net 4221.3 ml    Review of Systems  Unable to perform ROS  Physical Exam  RASS 0, intermittent trach tube cuff leak,vent graphics look good, no air leak  HEENT; Cushingoid facies, temporal wasting, NGT present. Tube feeds are infusing. Trach site clean Diffuse rhonchi Reg with occ extrasystoles, no M Abdomen soft, + BS Ext cool, trace symmetric edema, marked muscle wasting Severe sacral decubitus ulcer- to the spine  LABS:  CBC  Recent Labs Lab 11/27/15 0436 11/28/15 0458 11/28/15 1348  WBC 15.7* 15.6* 19.6*  HGB 9.1* 9.1* 9.0*  HCT 29.6* 29.8* 29.6*  PLT 92* 94* 81*   Coag's No results for input(s): APTT, INR in the last 168 hours. BMET  Recent Labs Lab 11/23/15 1339 11/25/15 0938 11/28/15 1348  NA 131* 133* 129*  K 3.8 3.5 3.8  CL 96* 97* 95*  CO2 29 27 26   BUN 72* 72* 102*  CREATININE 1.45* 1.13* 1.45*  GLUCOSE 101* 78 109*   Electrolytes  Recent Labs Lab 11/23/15 1339 11/25/15 0938 11/28/15 1348  CALCIUM 8.1* 7.8* 8.0*  PHOS 5.2* 4.9* 6.1*   Sepsis Markers No results for input(s): LATICACIDVEN, PROCALCITON, O2SATVEN in the last 168 hours. ABG No results for input(s): PHART, PCO2ART, PO2ART in the last 168 hours. Liver Enzymes  Recent Labs Lab 11/23/15 1339 11/25/15 0938 11/28/15 1348  ALBUMIN 1.5* 1.5* 1.6*   Cardiac Enzymes No results for input(s): TROPONINI, PROBNP in the last 168 hours. Glucose  Recent Labs Lab 11/27/15 1502 11/27/15 2326 11/28/15 0749 11/28/15 1611 11/29/15 0023 11/29/15 0728  GLUCAP 128* 134* 109* 93 95 84    Imaging No results found.  ASSESSMENT / PLAN: IMPRESSION: Very prolonged and complicated hospitalization after gastric bypass surgery 2014 Never returned home ESRD-hd as tolerated Recurrent severe sepsis - PAN resistant Klebisella in sputum Recurrent PNA-now with pseudomonas H/O C diff H/O VTE DM2 - controlled Episodic hypoglycemia Chronic steroid therapy, for a history  of of adrenal insufficiency Incidental finding of R hip fx - conservative mgmt. 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 Husband declined TEE Profound deconditioning Chronic pain New finding of pericardial friction rub 12/19 - resolved New finding of cardiomyopathy, likely post MI 12/19  PAF/flutter Sinus bradycardia HCAP with MDR Donalee Citrin and pseudomonas Chronic anemia Severe protein - calorie malnutrition TF intolerance Mild thrombocytopenia 01/06  Cont vent support - settings reviewed and/or adjusted Cont vent bundle Cont HD per renal Monitor BMET intermittently Monitor I/Os Correct electrolytes as indicated DVT px: SQ heparin Monitor CBC intermittently Transfuse per usual guidelines Monitor temp, WBC count Micro and abx as above ID following - appreciate recs Cont Amiodarone - 100 mg per tube daily DC metoprolol Cont opiates PRN Cont D10 Infusion for hypoglycemia TFs @  30 cc/hr Vasopressors to keep MAP>60   Prognosis is very poor without hope for functional recovery. Patient is dialysis and Vent dependent with severe sacral decub ulcer with chronic bone infection and spinal infection with underlying CHF and CVA with recurrent pneumonia.   This patient in my opinion is deemed Futile. Husband understands but unrealistic of goals of care  Patient with intermittent delirium with chronic sepsis and can not make decisions for herself.   Would recommend DNR status and Comfort care measures  I have personally obtained a history, examined the patient, evaluated Pertinent laboratory and RadioGraphic/imaging results, and  formulated the assessment and plan   The Patient requires high complexity decision making for assessment and support, frequent evaluation and  titration of therapies, application of advanced monitoring technologies and extensive interpretation of multiple databases. Critical Care Time devoted to patient care services described in this note is 35 minutes.   Overall, patient is critically ill, prognosis is guarded.  Patient with Multiorgan failure and at high risk for cardiac arrest and death.    Lucie Leather, M.D.  [facility]  Medical Director [facility] Medical Director [facility] Cardio-Pulmonary Department

## 2015-11-29 NOTE — Progress Notes (Signed)
Subjective:  Patient remains critically ill Did not respond to voice commands today BP remains borderline low 2000 cc removed with HD yesterday   HEMODIALYSIS FLOWSHEET: 1/9  Blood Flow Rate (mL/min): 400 mL/min Arterial Pressure (mmHg): -130 mmHg Venous Pressure (mmHg): 170 mmHg Transmembrane Pressure (mmHg): 50 mmHg Ultrafiltration Rate (mL/min): 630 mL/min Dialysate Flow Rate (mL/min): 600 ml/min Conductivity: Machine : 14 Conductivity: Machine : 14 Dialysis Fluid Bolus: Normal Saline Bolus Amount (mL): 250 mL Dialysate Change: Other (comment) (3k) Intra-Hemodialysis Comments: removed, tx edned. pt risnback, tolerated tx    Objective:  Vital signs in last 24 hours:  Temp:  [97.4 F (36.3 C)-99.5 F (37.5 C)] 99.5 F (37.5 C) (01/10 0800) Pulse Rate:  [36-85] 77 (01/10 0900) Resp:  [11-23] 17 (01/10 0900) BP: (78-137)/(38-100) 104/54 mmHg (01/10 0900) SpO2:  [96 %-100 %] 98 % (01/10 0900) FiO2 (%):  [40 %] 40 % (01/10 0834)  Weight change:  Filed Weights   11/21/15 0500 11/21/15 1540 11/22/15 0500  Weight: 107 kg (235 lb 14.3 oz) 107 kg (235 lb 14.3 oz) 102 kg (224 lb 13.9 oz)    Intake/Output: I/O last 3 completed shifts: In: 2884.7 [I.V.:933.7; ZO/XW:9604.5; IV Piggyback:503.3] Out: -1995 [Emesis/NG output:5]   Intake/Output this shift:  Total I/O In: 117.6 [I.V.:57.6; NG/GT:60] Out: -   Physical Exam: General: critically ill   Head/ENT: Moist oral mucosal membrane, +NG tube  Eyes: Anicteric, eyes closed  Neck: Tracheostomy in place  Lungs:  vent assisted PRVC fio2 40%  Heart: Regular with ectopic beats  Abdomen:  Soft, nontender, BS present  Extremities:   anasarca  Neurologic: Resting comfortably,    Access:  R IJ permcath.       Basic Metabolic Panel:  Recent Labs Lab 11/23/15 1339 11/25/15 0938 11/28/15 1348  NA 131* 133* 129*  K 3.8 3.5 3.8  CL 96* 97* 95*  CO2 29 27 26   GLUCOSE 101* 78 109*  BUN 72* 72* 102*  CREATININE  1.45* 1.13* 1.45*  CALCIUM 8.1* 7.8* 8.0*  PHOS 5.2* 4.9* 6.1*    Liver Function Tests:  Recent Labs Lab 11/23/15 1339 11/25/15 0938 11/28/15 1348  ALBUMIN 1.5* 1.5* 1.6*   No results for input(s): LIPASE, AMYLASE in the last 168 hours. No results for input(s): AMMONIA in the last 168 hours.  CBC:  Recent Labs Lab 11/23/15 1339 11/25/15 0938 11/27/15 0436 11/28/15 0458 11/28/15 1348  WBC 10.6 12.8* 15.7* 15.6* 19.6*  HGB 9.8* 9.0* 9.1* 9.1* 9.0*  HCT 31.7* 29.2* 29.6* 29.8* 29.6*  MCV 91.1 91.8 95.9 93.8 94.8  PLT 119* 99* 92* 94* 81*    Cardiac Enzymes: No results for input(s): CKTOTAL, CKMB, CKMBINDEX, TROPONINI in the last 168 hours.  BNP: Invalid input(s): POCBNP  CBG:  Recent Labs Lab 11/27/15 2326 11/28/15 0749 11/28/15 1611 11/29/15 0023 11/29/15 0728  GLUCAP 134* 109* 93 95 84    Microbiology: Results for orders placed or performed during the hospital encounter of 08/16/2015  Blood Culture (routine x 2)     Status: None   Collection Time: 07/27/2015  8:51 AM  Result Value Ref Range Status   Specimen Description BLOOD [name]  Final   Special Requests BOTTLES DRAWN AEROBIC AND ANAEROBIC  3CC  Final   Culture NO GROWTH 5 DAYS  Final   Report Status 08/17/2015 FINAL  Final  Blood Culture (routine x 2)     Status: None   Collection Time: 08/11/2015  9:20 AM  Result Value Ref Range  Status   Specimen Description BLOOD LEFT ARM  Final   Special Requests BOTTLES DRAWN AEROBIC AND ANAEROBIC  1CC  Final   Culture NO GROWTH 5 DAYS  Final   Report Status 08/17/2015 FINAL  Final  Wound culture     Status: None   Collection Time: 2015-09-09  9:20 AM  Result Value Ref Range Status   Specimen Description DECUBITIS  Final   Special Requests Normal  Final   Gram Stain   Final    FEW WBC SEEN MANY GRAM NEGATIVE RODS RARE GRAM POSITIVE COCCI    Culture   Final    HEAVY GROWTH ESCHERICHIA COLI MODERATE GROWTH ENTEROBACTER AEROGENES PROTEUS MIRABILIS HEAVY  GROWTH ENTEROCOCCUS SPECIES VRE HAVE INTRINSIC RESISTANCE TO MOST COMMONLY USED ANTIBIOTICS AND THE ABILITY TO ACQUIRE RESISTANCE TO MOST AVAILABLE ANTIBIOTICS.    Report Status 08/16/2015 FINAL  Final   Organism ID, Bacteria ESCHERICHIA COLI  Final   Organism ID, Bacteria ENTEROBACTER AEROGENES  Final   Organism ID, Bacteria PROTEUS MIRABILIS  Final   Organism ID, Bacteria ENTEROCOCCUS SPECIES  Final      Susceptibility   Enterobacter aerogenes - MIC*    CEFTAZIDIME <=1 SENSITIVE Sensitive     CEFAZOLIN >=64 RESISTANT Resistant     CEFTRIAXONE <=1 SENSITIVE Sensitive     CIPROFLOXACIN <=0.25 SENSITIVE Sensitive     GENTAMICIN <=1 SENSITIVE Sensitive     IMIPENEM 1 SENSITIVE Sensitive     TRIMETH/SULFA <=20 SENSITIVE Sensitive     * MODERATE GROWTH ENTEROBACTER AEROGENES   Escherichia coli - MIC*    AMPICILLIN <=2 SENSITIVE Sensitive     CEFTAZIDIME <=1 SENSITIVE Sensitive     CEFAZOLIN <=4 SENSITIVE Sensitive     CEFTRIAXONE <=1 SENSITIVE Sensitive     CIPROFLOXACIN <=0.25 SENSITIVE Sensitive     GENTAMICIN <=1 SENSITIVE Sensitive     IMIPENEM <=0.25 SENSITIVE Sensitive     TRIMETH/SULFA <=20 SENSITIVE Sensitive     * HEAVY GROWTH ESCHERICHIA COLI   Proteus mirabilis - MIC*    AMPICILLIN >=32 RESISTANT Resistant     CEFTAZIDIME <=1 SENSITIVE Sensitive     CEFAZOLIN 8 SENSITIVE Sensitive     CEFTRIAXONE <=1 SENSITIVE Sensitive     CIPROFLOXACIN <=0.25 SENSITIVE Sensitive     GENTAMICIN <=1 SENSITIVE Sensitive     IMIPENEM 1 SENSITIVE Sensitive     TRIMETH/SULFA <=20 SENSITIVE Sensitive     * PROTEUS MIRABILIS   Enterococcus species - MIC*    AMPICILLIN >=32 RESISTANT Resistant     VANCOMYCIN >=32 RESISTANT Resistant     GENTAMICIN SYNERGY SENSITIVE Sensitive     TETRACYCLINE Value in next row Resistant      RESISTANT>=16    * HEAVY GROWTH ENTEROCOCCUS SPECIES  MRSA PCR Screening     Status: None   Collection Time: Sep 09, 2015  2:38 PM  Result Value Ref Range Status   MRSA  by PCR NEGATIVE NEGATIVE Final    Comment:        The GeneXpert MRSA Assay (FDA approved for NASAL specimens only), is one component of a comprehensive MRSA colonization surveillance program. It is not intended to diagnose MRSA infection nor to guide or monitor treatment for MRSA infections.   Blood culture (single)     Status: None   Collection Time: 09/09/15  3:36 PM  Result Value Ref Range Status   Specimen Description BLOOD RIGHT ASSIST CONTROL  Final   Special Requests BOTTLES DRAWN AEROBIC AND ANAEROBIC  Final  Culture NO GROWTH 5 DAYS  Final   Report Status 08/17/2015 FINAL  Final  Wound culture     Status: None   Collection Time: 08/13/15 12:37 PM  Result Value Ref Range Status   Specimen Description WOUND  Final   Special Requests Normal  Final   Gram Stain   Final    FEW WBC SEEN TOO NUMEROUS TO COUNT GRAM NEGATIVE RODS FEW GRAM POSITIVE COCCI    Culture   Final    HEAVY GROWTH ESCHERICHIA COLI MODERATE GROWTH PROTEUS MIRABILIS LIGHT GROWTH KLEBSIELLA PNEUMONIAE MODERATE GROWTH ENTEROCOCCUS GALLINARUM CRITICAL RESULT CALLED TO, READ BACK BY AND VERIFIED WITH: [facility] BORBA AT 1042 08/16/15 DV    Report Status 08/17/2015 FINAL  Final   Organism ID, Bacteria ESCHERICHIA COLI  Final   Organism ID, Bacteria PROTEUS MIRABILIS  Final   Organism ID, Bacteria KLEBSIELLA PNEUMONIAE  Final   Organism ID, Bacteria ENTEROCOCCUS GALLINARUM  Final      Susceptibility   Escherichia coli - MIC*    AMPICILLIN >=32 RESISTANT Resistant     CEFTAZIDIME 4 RESISTANT Resistant     CEFAZOLIN >=64 RESISTANT Resistant     CEFTRIAXONE 16 RESISTANT Resistant     GENTAMICIN 2 SENSITIVE Sensitive     IMIPENEM >=16 RESISTANT Resistant     TRIMETH/SULFA <=20 SENSITIVE Sensitive     Extended ESBL POSITIVE Resistant     PIP/TAZO Value in next row Resistant      RESISTANT>=128    CIPROFLOXACIN Value in next row Sensitive      SENSITIVE<=0.25    * HEAVY GROWTH ESCHERICHIA COLI    Klebsiella pneumoniae - MIC*    AMPICILLIN Value in next row Resistant      SENSITIVE<=0.25    CEFTAZIDIME Value in next row Resistant      SENSITIVE<=0.25    CEFAZOLIN Value in next row Resistant      SENSITIVE<=0.25    CEFTRIAXONE Value in next row Resistant      SENSITIVE<=0.25    CIPROFLOXACIN Value in next row Resistant      SENSITIVE<=0.25    GENTAMICIN Value in next row Sensitive      SENSITIVE<=0.25    IMIPENEM Value in next row Resistant      SENSITIVE<=0.25    TRIMETH/SULFA Value in next row Resistant      SENSITIVE<=0.25    PIP/TAZO Value in next row Resistant      RESISTANT>=128    * LIGHT GROWTH KLEBSIELLA PNEUMONIAE   Proteus mirabilis - MIC*    AMPICILLIN Value in next row Resistant      RESISTANT>=128    CEFTAZIDIME Value in next row Sensitive      RESISTANT>=128    CEFAZOLIN Value in next row Sensitive      RESISTANT>=128    CEFTRIAXONE Value in next row Sensitive      RESISTANT>=128    CIPROFLOXACIN Value in next row Sensitive      RESISTANT>=128    GENTAMICIN Value in next row Sensitive      RESISTANT>=128    IMIPENEM Value in next row Sensitive      RESISTANT>=128    TRIMETH/SULFA Value in next row Sensitive      RESISTANT>=128    PIP/TAZO Value in next row Sensitive      SENSITIVE<=4    * MODERATE GROWTH PROTEUS MIRABILIS   Enterococcus gallinarum - MIC*    AMPICILLIN Value in next row Resistant      SENSITIVE<=4    GENTAMICIN  SYNERGY Value in next row Sensitive      SENSITIVE<=4    CIPROFLOXACIN Value in next row Resistant      RESISTANT>=8    TETRACYCLINE Value in next row Resistant      RESISTANT>=16    * MODERATE GROWTH ENTEROCOCCUS GALLINARUM  Wound culture     Status: None   Collection Time: 08/15/15  2:53 PM  Result Value Ref Range Status   Specimen Description WOUND  Final   Special Requests NONE  Final   Gram Stain FEW WBC SEEN NO ORGANISMS SEEN   Final   Culture NO GROWTH 3 DAYS  Final   Report Status 08/18/2015 FINAL  Final   C difficile quick scan w PCR reflex     Status: None   Collection Time: 08/17/15 11:34 AM  Result Value Ref Range Status   C Diff antigen NEGATIVE NEGATIVE Final   C Diff toxin NEGATIVE NEGATIVE Final   C Diff interpretation Negative for C. difficile  Final  Stool culture     Status: None   Collection Time: 09/03/15  3:51 PM  Result Value Ref Range Status   Specimen Description STOOL  Final   Special Requests Immunocompromised  Final   Culture   Final    NO SALMONELLA OR SHIGELLA ISOLATED No Pathogenic E. coli detected NO CAMPYLOBACTER DETECTED    Report Status 09/07/2015 FINAL  Final  C difficile quick scan w PCR reflex     Status: None   Collection Time: 09/13/15 12:51 AM  Result Value Ref Range Status   C Diff antigen NEGATIVE NEGATIVE Final   C Diff toxin NEGATIVE NEGATIVE Final   C Diff interpretation Negative for C. difficile  Final  Culture, blood (routine x 2)     Status: None   Collection Time: 09/16/15 11:24 AM  Result Value Ref Range Status   Specimen Description BLOOD LEFT HAND  Final   Special Requests BOTTLES DRAWN AEROBIC AND ANAEROBIC  1CC  Final   Culture NO GROWTH 5 DAYS  Final   Report Status 09/21/2015 FINAL  Final  Culture, blood (routine x 2)     Status: None   Collection Time: 09/16/15 12:23 PM  Result Value Ref Range Status   Specimen Description BLOOD RIGHT HAND  Final   Special Requests BOTTLES DRAWN AEROBIC AND ANAEROBIC  1CC  Final   Culture  Setup Time   Final    GRAM POSITIVE COCCI AEROBIC BOTTLE ONLY CRITICAL RESULT CALLED TO, READ BACK BY AND VERIFIED WITH: TESS THOMAS,RN 09/17/2015 0631 BY JRS.    Culture   Final    ENTEROCOCCUS FAECALIS AEROBIC BOTTLE ONLY VRE HAVE INTRINSIC RESISTANCE TO MOST COMMONLY USED ANTIBIOTICS AND THE ABILITY TO ACQUIRE RESISTANCE TO MOST AVAILABLE ANTIBIOTICS. CRITICAL RESULT CALLED TO, READ BACK BY AND VERIFIED WITH: CHERYL SMITH AT [id number] 09/19/15 DV    Report Status 09/21/2015 FINAL  Final   Organism ID,  Bacteria ENTEROCOCCUS FAECALIS  Final      Susceptibility   Enterococcus faecalis - MIC*    AMPICILLIN <=2 SENSITIVE Sensitive     LINEZOLID 2 SENSITIVE Sensitive     CIPROFLOXACIN Value in next row Resistant      RESISTANT>=8    TETRACYCLINE Value in next row Resistant      RESISTANT>=16    VANCOMYCIN Value in next row Resistant      RESISTANT>=32    GENTAMICIN SYNERGY Value in next row Resistant      RESISTANT>=32    *  ENTEROCOCCUS FAECALIS  Culture, respiratory (NON-Expectorated)     Status: None   Collection Time: 09/16/15  3:50 PM  Result Value Ref Range Status   Specimen Description TRACHEAL ASPIRATE  Final   Special Requests Immunocompromised  Final   Gram Stain   Final    FEW WBC SEEN GOOD SPECIMEN - 80-90% WBCS RARE GRAM NEGATIVE RODS    Culture LIGHT GROWTH PSEUDOMONAS AERUGINOSA  Final   Report Status 09/19/2015 FINAL  Final   Organism ID, Bacteria PSEUDOMONAS AERUGINOSA  Final      Susceptibility   Pseudomonas aeruginosa - MIC*    CEFTAZIDIME 8 SENSITIVE Sensitive     CIPROFLOXACIN 2 INTERMEDIATE Intermediate     GENTAMICIN >=16 RESISTANT Resistant     IMIPENEM >=16 RESISTANT Resistant     PIP/TAZO Value in next row Sensitive      SENSITIVE32    CEFEPIME Value in next row Sensitive      SENSITIVE8    LEVOFLOXACIN Value in next row Resistant      RESISTANT>=8    * LIGHT GROWTH PSEUDOMONAS AERUGINOSA  Culture, expectorated sputum-assessment     Status: None   Collection Time: 09/22/15  2:09 PM  Result Value Ref Range Status   Specimen Description ENDOTRACHEAL  Final   Special Requests Normal  Final   Sputum evaluation THIS SPECIMEN IS ACCEPTABLE FOR SPUTUM CULTURE  Final   Report Status 09/24/2015 FINAL  Final  Culture, respiratory (NON-Expectorated)     Status: None   Collection Time: 09/22/15  2:09 PM  Result Value Ref Range Status   Specimen Description ENDOTRACHEAL  Final   Special Requests Normal Reflexed from [id number]  Final   Gram Stain   Final     FEW WBC SEEN FEW GRAM NEGATIVE RODS POOR SPECIMEN - LESS THAN 70% WBCS    Culture   Final    MODERATE GROWTH PSEUDOMONAS AERUGINOSA LIGHT GROWTH KLEBSIELLA PNEUMONIAE REFER TO SENSITIVITIES FROM PREVIOUS CULTURE FOR ORG 2    Report Status 10/01/2015 FINAL  Final   Organism ID, Bacteria PSEUDOMONAS AERUGINOSA  Final   Organism ID, Bacteria KLEBSIELLA PNEUMONIAE  Final      Susceptibility   Pseudomonas aeruginosa - MIC*    CEFTAZIDIME 4 SENSITIVE Sensitive     CIPROFLOXACIN 2 INTERMEDIATE Intermediate     GENTAMICIN >=16 RESISTANT Resistant     IMIPENEM >=16 RESISTANT Resistant     PIP/TAZO Value in next row Sensitive      SENSITIVE16    * MODERATE GROWTH PSEUDOMONAS AERUGINOSA  Tissue culture     Status: None   Collection Time: 09/24/15  6:44 AM  Result Value Ref Range Status   Specimen Description BONE  Final   Special Requests Normal  Final   Gram Stain MODERATE WBC SEEN FEW GRAM NEGATIVE RODS   Final   Culture   Final    MODERATE GROWTH ESCHERICHIA COLI MODERATE GROWTH KLEBSIELLA PNEUMONIAE ESBL-EXTENDED SPECTRUM BETA LACTAMASE-THE ORGANISM IS RESISTANT TO PENICILLINS, CEPHALOSPORINS AND AZTREONAM ACCORDING TO CLSI M100-S15 VOL.25 N01 JAN 2005. ORGANISM 1 This organism isolate is resistant to one or more antiotic agents in three or more antimicrobial categories.  Suggest Infectious Disease consult.   ORGANISM 2 CRITICAL RESULT CALLED TO, READ BACK BY AND VERIFIED WITH: RN Mardene Celeste LINDSAY 09/27/15 1005AM    Report Status 09/28/2015 FINAL  Final   Organism ID, Bacteria ESCHERICHIA COLI  Final   Organism ID, Bacteria KLEBSIELLA PNEUMONIAE  Final      Susceptibility   Escherichia  coli - MIC*    AMPICILLIN >=32 RESISTANT Resistant     CEFTAZIDIME 4 RESISTANT Resistant     CEFAZOLIN >=64 RESISTANT Resistant     CEFTRIAXONE 8 RESISTANT Resistant     GENTAMICIN <=1 SENSITIVE Sensitive     IMIPENEM 8 RESISTANT Resistant     TRIMETH/SULFA <=20 SENSITIVE Sensitive     Extended  ESBL POSITIVE Resistant     PIP/TAZO Value in next row Resistant      RESISTANT>=128    * MODERATE GROWTH ESCHERICHIA COLI   Klebsiella pneumoniae - MIC*    AMPICILLIN Value in next row Resistant      RESISTANT>=128    CEFTAZIDIME Value in next row Resistant      RESISTANT>=128    CEFAZOLIN Value in next row Resistant      RESISTANT>=128    CEFTRIAXONE Value in next row Resistant      RESISTANT>=128    CIPROFLOXACIN Value in next row Resistant      RESISTANT>=128    GENTAMICIN Value in next row Resistant      RESISTANT>=128    IMIPENEM Value in next row Resistant      RESISTANT>=128    TRIMETH/SULFA Value in next row Sensitive      RESISTANT>=128    PIP/TAZO Value in next row Resistant      RESISTANT>=128    * MODERATE GROWTH KLEBSIELLA PNEUMONIAE  Anaerobic culture     Status: None   Collection Time: 09/24/15  3:55 PM  Result Value Ref Range Status   Specimen Description BONE  Final   Special Requests Normal  Final   Culture NO ANAEROBES ISOLATED  Final   Report Status 09/29/2015 FINAL  Final  Culture, fungus without smear     Status: None   Collection Time: 09/24/15  3:55 PM  Result Value Ref Range Status   Specimen Description BONE  Final   Special Requests Normal  Final   Culture NO FUNGUS ISOLATED AFTER 24 DAYS  Final   Report Status 10/18/2015 FINAL  Final  C difficile quick scan w PCR reflex     Status: None   Collection Time: 10/07/15  2:02 PM  Result Value Ref Range Status   C Diff antigen NEGATIVE NEGATIVE Final   C Diff toxin NEGATIVE NEGATIVE Final   C Diff interpretation Negative for C. difficile  Final  Culture, blood (routine x 2)     Status: None   Collection Time: 10/16/15  9:52 AM  Result Value Ref Range Status   Specimen Description BLOOD LEFT HAND  Final   Special Requests   Final    BOTTLES DRAWN AEROBIC AND ANAEROBIC  AER 6CC ANA 4CC   Culture NO GROWTH 6 DAYS  Final   Report Status 10/22/2015 FINAL  Final  Culture, blood (routine x 2)      Status: None   Collection Time: 10/16/15 12:25 PM  Result Value Ref Range Status   Specimen Description BLOOD LEFT HAND  Final   Special Requests BOTTLES DRAWN AEROBIC AND ANAEROBIC  5CC  Final   Culture NO GROWTH 6 DAYS  Final   Report Status 10/22/2015 FINAL  Final  Culture, expectorated sputum-assessment     Status: None   Collection Time: 10/18/15  1:15 PM  Result Value Ref Range Status   Specimen Description SPUTUM  Final   Special Requests NONE  Final   Sputum evaluation THIS SPECIMEN IS ACCEPTABLE FOR SPUTUM CULTURE  Final   Report Status 10/18/2015 FINAL  Final  Culture, respiratory (NON-Expectorated)     Status: None   Collection Time: 10/18/15  1:15 PM  Result Value Ref Range Status   Specimen Description SPUTUM  Final   Special Requests NONE Reflexed from [id number]  Final   Gram Stain   Final    GOOD SPECIMEN - 80-90% WBCS MODERATE WBC SEEN MANY GRAM NEGATIVE RODS    Culture   Final    HEAVY GROWTH PSEUDOMONAS AERUGINOSA MODERATE GROWTH KLEBSIELLA PNEUMONIAE CRITICAL RESULT CALLED TO, READ BACK BY AND VERIFIED WITHRod Mae AT 1610 10/21/15 DV KLEBSIELLA PNEUMONIAE This organism isolate is resistant to one or more antiotic agents in three or more antimicrobial categories.  Suggest Infectious Disease  consult.      Report Status 10/22/2015 FINAL  Final   Organism ID, Bacteria PSEUDOMONAS AERUGINOSA  Final   Organism ID, Bacteria KLEBSIELLA PNEUMONIAE  Final      Susceptibility   Klebsiella pneumoniae - MIC*    AMPICILLIN >=32 RESISTANT Resistant     CEFAZOLIN >=64 RESISTANT Resistant     CEFTRIAXONE >=64 RESISTANT Resistant     CIPROFLOXACIN >=4 RESISTANT Resistant     GENTAMICIN >=16 RESISTANT Resistant     IMIPENEM >=16 RESISTANT Resistant     NITROFURANTOIN >=512 RESISTANT Resistant     TRIMETH/SULFA <=20 SENSITIVE Sensitive     * MODERATE GROWTH KLEBSIELLA PNEUMONIAE   Pseudomonas aeruginosa - MIC*    CEFTAZIDIME 4 SENSITIVE Sensitive     CIPROFLOXACIN  >=4 RESISTANT Resistant     GENTAMICIN 8 INTERMEDIATE Intermediate     IMIPENEM >=16 RESISTANT Resistant     PIP/TAZO Value in next row Sensitive      SENSITIVE32    * HEAVY GROWTH PSEUDOMONAS AERUGINOSA  C difficile quick scan w PCR reflex     Status: None   Collection Time: 10/18/15  4:39 PM  Result Value Ref Range Status   C Diff antigen NEGATIVE NEGATIVE Final   C Diff toxin NEGATIVE NEGATIVE Final   C Diff interpretation Negative for C. difficile  Final  Culture, blood (Routine X 2) w Reflex to ID Panel     Status: None   Collection Time: 11/06/15  8:27 AM  Result Value Ref Range Status   Specimen Description BLOOD RIGHT HAND  Final   Special Requests BOTTLES DRAWN AEROBIC AND ANAEROBIC 3CC  Final   Culture NO GROWTH 5 DAYS  Final   Report Status 11/11/2015 FINAL  Final  Culture, blood (Routine X 2) w Reflex to ID Panel     Status: None   Collection Time: 11/06/15  8:29 AM  Result Value Ref Range Status   Specimen Description BLOOD LEFT HAND  Final   Special Requests BOTTLES DRAWN AEROBIC AND ANAEROBIC  1CC  Final   Culture NO GROWTH 5 DAYS  Final   Report Status 11/11/2015 FINAL  Final  Culture, expectorated sputum-assessment     Status: None   Collection Time: 11/06/15  8:41 AM  Result Value Ref Range Status   Specimen Description EXPECTORATED SPUTUM  Final   Special Requests Immunocompromised  Final   Sputum evaluation THIS SPECIMEN IS ACCEPTABLE FOR SPUTUM CULTURE  Final   Report Status 11/06/2015 FINAL  Final  Culture, respiratory (NON-Expectorated)     Status: None   Collection Time: 11/06/15  8:41 AM  Result Value Ref Range Status   Specimen Description EXPECTORATED SPUTUM  Final   Special Requests Immunocompromised Reflexed from [id number]  Final   Gram Stain  Final    MANY WBC SEEN MANY GRAM NEGATIVE RODS EXCELLENT SPECIMEN - 90-100% WBCS    Culture   Final    MODERATE GROWTH KLEBSIELLA PNEUMONIAE MODERATE GROWTH PSEUDOMONAS AERUGINOSA CRITICAL RESULT CALLED  TO, READ BACK BY AND VERIFIED WITH: DR. FITZGERALD AT 1120 11/09/15 DV DR. FITZGERALD REQUESTED ORGANISM BE SENT OUT FOR TIGECYCLINE CEFT/AVIBACTAM AND COLISTIN Sent to [facility] for further susceptibility testing. 11/11/15 CTJ    Report Status 11/15/2015 FINAL  Final   Organism ID, Bacteria KLEBSIELLA PNEUMONIAE  Final   Organism ID, Bacteria PSEUDOMONAS AERUGINOSA  Final      Susceptibility   Klebsiella pneumoniae - MIC*    AMPICILLIN >=32 RESISTANT Resistant     CEFAZOLIN >=64 RESISTANT Resistant     CEFTRIAXONE 32 RESISTANT Resistant     CIPROFLOXACIN >=4 RESISTANT Resistant     GENTAMICIN >=16 RESISTANT Resistant     IMIPENEM >=16 RESISTANT Resistant     NITROFURANTOIN 256 RESISTANT Resistant     TRIMETH/SULFA >=320 RESISTANT Resistant     Extended ESBL POSITIVE Resistant     * MODERATE GROWTH KLEBSIELLA PNEUMONIAE   Pseudomonas aeruginosa - MIC*    CEFTAZIDIME >=64 RESISTANT Resistant     CIPROFLOXACIN 2 INTERMEDIATE Intermediate     GENTAMICIN >=16 RESISTANT Resistant     IMIPENEM >=16 RESISTANT Resistant     * MODERATE GROWTH PSEUDOMONAS AERUGINOSA  Culture, respiratory (NON-Expectorated)     Status: None   Collection Time: 11/14/15  2:07 PM  Result Value Ref Range Status   Specimen Description TRACHEAL ASPIRATE  Final   Special Requests NONE  Final   Gram Stain   Final    EXCELLENT SPECIMEN - 90-100% WBCS MANY WBC SEEN MODERATE GRAM NEGATIVE RODS    Culture   Final    HEAVY GROWTH PSEUDOMONAS AERUGINOSA CRITICAL VALUE NOTED.  VALUE IS CONSISTENT WITH PREVIOUSLY REPORTED AND CALLED VALUE.    Report Status 11/16/2015 FINAL  Final   Organism ID, Bacteria PSEUDOMONAS AERUGINOSA  Final      Susceptibility   Pseudomonas aeruginosa - MIC*    CEFTAZIDIME >=64 RESISTANT Resistant     CIPROFLOXACIN 2 INTERMEDIATE Intermediate     GENTAMICIN >=16 RESISTANT Resistant     IMIPENEM >=16 RESISTANT Resistant     PIP/TAZO Value in next row Resistant      RESISTANT>=128     TOBRAMYCIN Value in next row Sensitive      SENSITIVE2    * HEAVY GROWTH PSEUDOMONAS AERUGINOSA    Coagulation Studies: No results for input(s): LABPROT, INR in the last 72 hours.  Urinalysis: No results for input(s): COLORURINE, LABSPEC, PHURINE, GLUCOSEU, HGBUR, BILIRUBINUR, KETONESUR, PROTEINUR, UROBILINOGEN, NITRITE, LEUKOCYTESUR in the last 72 hours.  Invalid input(s): APPERANCEUR    Imaging: No results found.   Medications:   . dextrose 25 mL/hr at 11/29/15 0700  . feeding supplement (VITAL AF 1.2 CAL) 1,000 mL (11/29/15 0700)  . norepinephrine (LEVOPHED) Adult infusion 4 mcg/min (11/29/15 0700)   . albumin human  12.5 g Intravenous Once  . albumin human  12.5 g Intravenous Once  . albuterol  2.5 mg Nebulization Q6H  . amiodarone  100 mg Per Tube Daily  . antiseptic oral rinse  7 mL Mouth Rinse BID  . antiseptic oral rinse  7 mL Mouth Rinse QID  . aspirin  81 mg Per Tube Daily  . chlorhexidine gluconate  15 mL Mouth Rinse BID  . colistimethate (COLISTIN) IV  250 mg Intravenous Q48H  . epoetin (EPOGEN/PROCRIT)  injection  10,000 Units Intravenous Q M,W,F-HD  . escitalopram  10 mg Per Tube Daily  . feeding supplement (PRO-STAT SUGAR FREE 64)  30 mL Per Tube 6 X Daily  . free water  20 mL Per Tube 6 times per day  . heparin subcutaneous  5,000 Units Subcutaneous Q12H  . hydrocortisone  10 mg Per Tube BID  . lidocaine  1 patch Transdermal Q24H  . midodrine  10 mg Per Tube TID WC  . mirtazapine  7.5 mg Per Tube QHS  . multivitamin  5 mL Per Tube Daily  . pantoprazole sodium  40 mg Per Tube Daily  . sodium chloride  10-40 mL Intracatheter Q12H  . sodium hypochlorite   Irrigation BID  . tigecycline (TYGACIL) IVPB  50 mg Intravenous Q12H  . tobramycin (PF)  300 mg Nebulization BID   acetaminophen (TYLENOL) oral liquid 160 mg/5 mL, albuterol, HYDROmorphone (DILAUDID) injection, loperamide, ondansetron (ZOFRAN) IV, oxyCODONE, polyvinyl alcohol, sodium chloride, zinc  oxide  Assessment/ Plan:  50 y.o. 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 Admitted on August 28, 2015  Echo: 11/07/19: EF of 30% to 35%.   1. End-stage renal disease on hemodialysis on HD MWF. The patient has been on dialysis since October of 2014. R IJ permcath.  - Dialysis treatment with IV albumin for oncotic support. Slow BFR   - Midodrine and hydrocortisone ordered for blood pressure support.  - Continue MWF schedule, monitor daily for dialysis treatment.  -Overall prognosis is extremely poor - In my opinion she will not be able to get well enough for an outpatient dialysis placement  2. Anemia of CKD. Hemoglobin 9.0. Multiple transfusions during admission   - Epogen and periodic blood transfusions. Not a candidate for IV iron.   3. Acute resp failure: ventilator dependent -prolonged course on ventilator, fio2 currently 40% with PEEP of 5.    4. Sepsis: pseudomonas in tracheal aspirate - tobramycin inhaled  - tigecycline, erythromycin    5. Secondary Hyperparathyroidism: PTH 50, phos 5.2 - not currently on a binder or vitamin D agent.   6. Anasarca - UF as with HD as tolerated    LOS: 109 Courtney [name] 1/10/201712:08 PM

## 2015-11-30 LAB — CBC WITH DIFFERENTIAL/PLATELET
Basophils Absolute: 0 10*3/uL (ref 0–0.1)
Basophils Relative: 0 %
Eosinophils Absolute: 0 10*3/uL (ref 0–0.7)
HCT: 29.7 % — ABNORMAL LOW (ref 35.0–47.0)
Hemoglobin: 8.9 g/dL — ABNORMAL LOW (ref 12.0–16.0)
LYMPHS ABS: 0.7 10*3/uL — AB (ref 1.0–3.6)
MCH: 29.3 pg (ref 26.0–34.0)
MCHC: 30.1 g/dL — AB (ref 32.0–36.0)
MCV: 97.5 fL (ref 80.0–100.0)
MONO ABS: 1 10*3/uL — AB (ref 0.2–0.9)
Neutro Abs: 11.4 10*3/uL — ABNORMAL HIGH (ref 1.4–6.5)
Neutrophils Relative %: 86 %
PLATELETS: 68 10*3/uL — AB (ref 150–440)
RBC: 3.04 MIL/uL — AB (ref 3.80–5.20)
RDW: 20.9 % — AB (ref 11.5–14.5)
WBC: 13.3 10*3/uL — ABNORMAL HIGH (ref 3.6–11.0)

## 2015-11-30 LAB — BASIC METABOLIC PANEL
Anion gap: 5 (ref 5–15)
BUN: 95 mg/dL — AB (ref 6–20)
CO2: 27 mmol/L (ref 22–32)
CREATININE: 1.38 mg/dL — AB (ref 0.44–1.00)
Calcium: 8 mg/dL — ABNORMAL LOW (ref 8.9–10.3)
Chloride: 97 mmol/L — ABNORMAL LOW (ref 101–111)
GFR calc Af Amer: 51 mL/min — ABNORMAL LOW (ref >60)
GFR, EST NON AFRICAN AMERICAN: 44 mL/min — AB (ref >60)
GLUCOSE: 140 mg/dL — AB (ref 65–99)
POTASSIUM: 3.9 mmol/L (ref 3.5–5.1)
Sodium: 129 mmol/L — ABNORMAL LOW (ref 135–145)

## 2015-11-30 LAB — GLUCOSE, CAPILLARY
GLUCOSE-CAPILLARY: 107 mg/dL — AB (ref 65–99)
Glucose-Capillary: 113 mg/dL — ABNORMAL HIGH (ref 65–99)
Glucose-Capillary: 139 mg/dL — ABNORMAL HIGH (ref 65–99)
Glucose-Capillary: 142 mg/dL — ABNORMAL HIGH (ref 65–99)

## 2015-11-30 MED ORDER — ALBUMIN HUMAN 25 % IV SOLN
12.5000 g | Freq: Once | INTRAVENOUS | Status: AC
  Administered 2015-11-30: 12.5 g via INTRAVENOUS
  Filled 2015-11-30: qty 50

## 2015-11-30 NOTE — Progress Notes (Signed)
PULMONARY / CRITICAL CARE MEDICINE   Name: Courtney [name] MRN: [id number] DOB: 1966/07/03    ADMISSION DATE:  2015/04/09  BRIEF HISTORY: 49 AAF who has been in medical facilities (hosp, LTAC, rehab) for 2 yrs following gastric bypass surgery with multiple complications. Now with chronic trach, ESRD, profound debilitation, severe sacral pressure ulcer. Was seemingly making progress and transferred to rehab facility approx one week prior to this admission. She was sent to [facility] ED with AMS and hypotension. Working dx of severe sepsis/septic shock due to infected sacral pressure ulcer. Since admission. Her course is been very complicated with numerous complications including septic shock and GI bleeding. Now with failure to wean from vent and possible MI event, repeat ECHO EF 30-35, possible old MI, severe hypokinesis of the anterior and anteroseptal myocardium   SUBJ: lethargic today, she cannot provide a history, she is arousable to physical stimulation. Placed on bear hugger for hypothermia Remains on vent-unable to wean Placed on vasopressors, guppy breathing Patient given morphine for dressing changes of sacral wound Plan for PS mode trial  Summary of MAJOR EVENTS/TEST RESULTS: Admission 02/07/14-05/07/14 Admission 07/21/14-09/06/14 Discharged to [facility]. Pt had palliative consult at that time, were asked to sign off by husband.  09/23 CT head: NAD 09/23 EEG: no epileptiform activity 09/23 PRBCs for Hgb 6.4 09/24 bedside debridement of sacral wound. Abscess drained 09/25 Off vasopressors. More alert. No distress. Worsening thrombocytopenia. Vanc DC'd 09/29 Dr. [name] (I.D) excused from the case by patient's husband. 10/02 MRI -multiple infarcts 10/03 tracheal bleeding- transfused platelets 10/03 hospitalist service excused from the case by patient's husband 10/03 Echocardiogram ejection fraction was 55-60%, pulmonary systolic pressure was 39 mmHg 40/9810/08 restart TF's at lower  rate, attempt reg HD  10/12 Transferred to med-surg floor. Remains on PCCM service 10/14 SLP eval: pt unable to tolerate PMV adequately 10/17-will re-attempt PM valve-discussed with Speech therapist 10/18 passed swallow eval-start pureed thick foods no thin liquids-continue NG feeds 10/19 transferred to step down for sepsis/aspiration pneumonia 10/19 cxr shows RLL opacity 10/21 sacral decub debride at bedside by surgery 10/22 started back on vasopressors while on HD 10/27 CT with osteo, R hip fx, unable to identify tip of dubhoff tube - sent for fluoro study 10/28 Ortho consultation: I do not feel that she is a surgical candidate. Therefore, I feel that it would best to manage this fracture nonsurgically and allow it to heal by itself over time, which it should.  10/28 Gen Surg consultation: Due to the lack of free air or free flow of contrast and the peritoneum there is no indication for any surgical intervention on this. Would recommend pulling back the feeding tube 1-2 cm. Would recheck an abdominal film to confirm no pneumoperitoneum in the morning. Absence of any changes okay to continue using Dobbhoff for feeding and medications. 10/28 gastrograffin study: The study confirms that the feeding tube pes perforated through the duodenum and the tip is within a cavity that fills with injected contrast. The cavity does appear walled off 11/4 refuses oral feeds, continue TF's CT reviewed: T8-T9 discitis/osteomyelitis, RT HIP FRACTURE 11/5 placed back on vasopressors, placed back on Vent due to resp acidosis, levophed turned off 11/6 afternoon 11/5 PM Trach changed out due to cuff leak, #6 Shiley cuffed, 11/6 dubhoff occluded, removed and replaced, new dubhoff shows tube in the antral stomach 11/15 refusing to take oral feeds 11/18 placed back on Vent for increased WOB,SOB. 11/28 Wound care as re -consulted, there appears to be a new  pocket/fistula around the area of her stage IV sacral  wound. 10/18/2015; the patient developed a new left basal pneumonia; with recurrent sepsis.  10/19/2015; fevers resolved with IV acetaminophen. Tube feeds changed from vital to Jevity.  12/7 CXR with stable b/l opacities.  12/12 elevated K s/p HD-remains on AC mode 12/16-vomiting-hold feeds and re-assess in next 24 hrs 12/17- TF restarted at 1/2 goal (goal = 50cc/per) 12/18- elevated wbc, fever (101.4), recultured, restarted on ceftaz 12/19 Pericardial friction rub. Echocardiogram: LVEF 30-35%, anteroseptal hypokinesis or akinesis c/w anterior wall MI 12/20 Dr Sung Amabile discussed new echocardiogram findings with pt's husband and explained that this likely represents an anterior wall MI sometime in the previous couple of weeks. It was explained that she is not a candidate for any cardiology intervention. Code status was again discussed. Husband expressed understanding that she was not going to survive this illness in any favorable way but continues to request that she be full code status based on what he believes to be her wishes 12/21 vomitied 50cc of TF - AM TF held, restart TF at 3pm @30cc  12/26 Persistent intermittent trach cuff leak. Distal XLT trach requested.  12/27 15 beat NSVT 12/28 patient refused HD and refused Bath time 12/29 D10 infusion for hypoglycemia, TF's on hold due to intolerance 12/30 CODE BLUE  VTACH 01/01 transfuse 2 units of blood 01/02 increased respiratory distress, copious purulent secretions 01/02 LE venous US: No DVT 01/03 Repeat TTE: The cavity size was mildly dilated. Systolic function was moderately to severely reduced. The estimated ejection fraction was in the range of 30% to 35%. Diffuse hypokinesis. Moderate to severe hypokinesis of the anteroseptal, anterior, and apical myocardium. Hypokinesis of the anterior myocardium 01/05 Hypothermic - Bair Hugger ordered 01/10-placed back on vasopressors for septic shock  INDWELLING DEVICES:: Trach (chronic) placed  June 2014 Tunneled R IJ HD cath (chronic) Tunneled L IJ CVL (chronic) L femoral A-line 9/23 >> 9/25  MICRO DATA: 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 Pneumoniae, P. Mirabilis, VRE Wound 9/26 >> No growth Blood 9/23 >> NEG CDiff 9/27>>neg Stool Cx 10/15>> negative Cdiff 10/25>>neg Trach Aspirate 10/28>> light growth pseudomonas Blood 10/28 >> 1/2 GPC >>  Sputum cultures obtained 09/22/15 due to mucus plugging>>Pseudomonas BONE TISSUE Cx 11/3>>E.coli (ESBL), k. Pneumonia (daptomycin and septra) Bld Cx 11/27>>negative thus far.  Trach Asp 11/27>> grew out Pseudomonas, and Klebsiella pneumonia. Trach Asp 12/18>> MDR Klebsiella, Pseudomonas Bld Cx 12/18>> NEG 12/26 resp culture >> MDR pseudomonas  ANTIMICROBIALS:  Aztreonam 9/23 >> 9/24 Vanc 9/23 >> 9/25 Vanc 9/26>>9/27 Daptomycin 9/27>> 10/12, 10/28>> off Meropenem 9/23 >> 10/14, 10/28 >> 10/31 Zosyn 11/27,>> 11/30. Levaquin 11/29>> 11/29. Ceftaz 12/1>>12/11, 12/18>> 12/24 Colisitin 12/26 >>  Tigecycline 12/26 >>  Nebulized tobramycin 12/30 >>  VITAL SIGNS: Temp:  [95.6 F (35.3 C)-98.2 F (36.8 C)] 97.6 F (36.4 C) (01/11 0400) Pulse Rate:  [35-77] 40 (01/11 0700) Resp:  [5-22] 16 (01/11 0700) BP: (72-136)/(35-87) 131/69 mmHg (01/11 0700) SpO2:  [98 %-100 %] 100 % (01/11 0700) FiO2 (%):  [40 %] 40 % (01/11 0417) HEMODYNAMICS:   VENTILATOR SETTINGS: Vent Mode:  [-] PRVC FiO2 (%):  [40 %] 40 % Set Rate:  [14 bmp] 14 bmp Vt Set:  [500 mL] 500 mL PEEP:  [5 cmH20] 5 cmH20 Plateau Pressure:  [29 cmH20-33 cmH20] 29 cmH20 INTAKE /  OUTPUT:  Intake/Output Summary (Last 24 hours) at 11/30/15 0815 Last data filed at 11/30/15 0700  Gross per 24 hour  Intake 2014.37 ml  Output      0 ml  Net 2014.37 ml    Review of  Systems  Unable to perform ROS  Physical Exam  RASS 0, intermittent trach tube cuff leak,vent graphics look good, no air leak  HEENT; Cushingoid facies, temporal wasting, NGT present. Tube feeds are infusing. Trach site clean Diffuse rhonchi Reg with occ extrasystoles, no M Abdomen soft, + BS Ext cool, trace symmetric edema, marked muscle wasting Severe sacral decubitus ulcer- to the spine  LABS:  CBC  Recent Labs Lab 11/27/15 0436 11/28/15 0458 11/28/15 1348  WBC 15.7* 15.6* 19.6*  HGB 9.1* 9.1* 9.0*  HCT 29.6* 29.8* 29.6*  PLT 92* 94* 81*   Coag's No results for input(s): APTT, INR in the last 168 hours. BMET  Recent Labs Lab 11/23/15 1339 11/25/15 0938 11/28/15 1348  NA 131* 133* 129*  K 3.8 3.5 3.8  CL 96* 97* 95*  CO2 29 27 26   BUN 72* 72* 102*  CREATININE 1.45* 1.13* 1.45*  GLUCOSE 101* 78 109*   Electrolytes  Recent Labs Lab 11/23/15 1339 11/25/15 0938 11/28/15 1348  CALCIUM 8.1* 7.8* 8.0*  PHOS 5.2* 4.9* 6.1*   Sepsis Markers No results for input(s): LATICACIDVEN, PROCALCITON, O2SATVEN in the last 168 hours. ABG No results for input(s): PHART, PCO2ART, PO2ART in the last 168 hours. Liver Enzymes  Recent Labs Lab 11/23/15 1339 11/25/15 0938 11/28/15 1348  ALBUMIN 1.5* 1.5* 1.6*   Cardiac Enzymes No results for input(s): TROPONINI, PROBNP in the last 168 hours. Glucose  Recent Labs Lab 11/28/15 1611 11/29/15 0023 11/29/15 0728 11/29/15 1608 11/30/15 0028 11/30/15 0748  GLUCAP 93 95 84 115* 107* 113*    Imaging No results found.  ASSESSMENT / PLAN: IMPRESSION: Very prolonged and complicated hospitalization after gastric bypass surgery 2014 Never returned home ESRD-hd as tolerated Recurrent severe sepsis - PAN resistant Klebisella in sputum Recurrent PNA-now with pseudomonas H/O C diff H/O VTE DM2 - controlled Episodic hypoglycemia Chronic steroid therapy, for a history of of adrenal  insufficiency Incidental finding of R hip fx - conservative mgmt. 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 Husband declined TEE Profound deconditioning Chronic pain New finding of pericardial friction rub 12/19 - resolved New finding of cardiomyopathy, likely post MI 12/19  PAF/flutter Sinus bradycardia HCAP with MDR Donalee Citrin and pseudomonas Chronic anemia Severe protein - calorie malnutrition TF intolerance Mild thrombocytopenia 01/06  Cont vent support - settings reviewed and/or adjusted Cont vent bundle Cont HD per renal Monitor BMET intermittently Monitor I/Os Correct electrolytes as indicated DVT px: SQ heparin Monitor CBC intermittently Transfuse per usual guidelines Monitor temp, WBC count Micro and abx as above ID following - appreciate recs Cont Amiodarone - 100 mg per tube daily DC metoprolol Cont opiates PRN Cont D10 Infusion for hypoglycemia TFs @  30 cc/hr Vasopressors to keep MAP>60   Prognosis is very poor without hope for functional recovery. Patient is dialysis and Vent dependent with severe sacral decub ulcer with chronic bone infection and spinal infection with underlying CHF and CVA with recurrent pneumonia.   This patient in my opinion is deemed Futile. Husband understands but unrealistic of goals of care  Patient with intermittent delirium with chronic sepsis and can not make decisions for herself.   Would recommend DNR status and Comfort care measures  I have personally obtained a history, examined the patient, evaluated Pertinent laboratory and RadioGraphic/imaging results, and  formulated the assessment and plan   The Patient requires high complexity decision making for assessment and support, frequent evaluation and titration of  therapies, application of advanced monitoring technologies and extensive interpretation of multiple databases. Critical Care Time devoted to patient care services described in this note is 35 minutes.   Overall, patient is critically ill, prognosis is guarded.  Patient with Multiorgan failure and at high risk for cardiac arrest and death.    Lucie Leather, M.D.  [facility]  Medical Director [facility] Medical Director [facility] Cardio-Pulmonary Department

## 2015-11-30 NOTE — Progress Notes (Signed)
Post HD TX Notes 

## 2015-11-30 NOTE — Progress Notes (Signed)
Pre HD Tx Assessment 

## 2015-11-30 NOTE — Progress Notes (Signed)
Pre HD Tx Machine & Patient Checks 

## 2015-11-30 NOTE — Progress Notes (Signed)
HD Tx Initiation 

## 2015-11-30 NOTE — Progress Notes (Signed)
Subjective:  Patient remains critically ill Did not respond to voice commands today BP remains borderline low 2000 cc removed with HD on monday Due for HD today   HEMODIALYSIS FLOWSHEET: 1/9  Blood Flow Rate (mL/min): 400 mL/min Arterial Pressure (mmHg): -130 mmHg Venous Pressure (mmHg): 170 mmHg Transmembrane Pressure (mmHg): 50 mmHg Ultrafiltration Rate (mL/min): 630 mL/min Dialysate Flow Rate (mL/min): 600 ml/min Conductivity: Machine : 14 Conductivity: Machine : 14 Dialysis Fluid Bolus: Normal Saline Bolus Amount (mL): 250 mL Dialysate Change: Other (comment) (3k) Intra-Hemodialysis Comments: removed, tx edned. pt risnback, tolerated tx    Objective:  Vital signs in last 24 hours:  Temp:  [95.6 F (35.3 C)-98.2 F (36.8 C)] 96.9 F (36.1 C) (01/11 0835) Pulse Rate:  [35-77] 38 (01/11 0800) Resp:  [5-22] 14 (01/11 0800) BP: (72-136)/(35-87) 126/57 mmHg (01/11 0800) SpO2:  [96 %-100 %] 96 % (01/11 0921) FiO2 (%):  [28 %-40 %] 28 % (01/11 0921)  Weight change:  Filed Weights   11/21/15 0500 11/21/15 1540 11/22/15 0500  Weight: 107 kg (235 lb 14.3 oz) 107 kg (235 lb 14.3 oz) 102 kg (224 lb 13.9 oz)    Intake/Output: I/O last 3 completed shifts: In: 3882.9 [I.V.:1341.9; NG/GT:2037.7; IV Piggyback:503.3] Out: -2000    Intake/Output this shift:  Total I/O In: 55 [I.V.:25; NG/GT:30] Out: -   Physical Exam: General: critically ill   Head/ENT: Moist oral mucosal membrane, +NG tube  Eyes: Anicteric, eyes closed  Neck: Tracheostomy in place  Lungs:  vent assisted PRVC fio2 40%  Heart: Regular with ectopic beats  Abdomen:  Soft, nontender, BS present  Extremities:   anasarca  Neurologic: Resting comfortably,    Access:  R IJ permcath.       Basic Metabolic Panel:  Recent Labs Lab 11/23/15 1339 11/25/15 0938 11/28/15 1348  NA 131* 133* 129*  K 3.8 3.5 3.8  CL 96* 97* 95*  CO2 29 27 26   GLUCOSE 101* 78 109*  BUN 72* 72* 102*  CREATININE  1.45* 1.13* 1.45*  CALCIUM 8.1* 7.8* 8.0*  PHOS 5.2* 4.9* 6.1*    Liver Function Tests:  Recent Labs Lab 11/23/15 1339 11/25/15 0938 11/28/15 1348  ALBUMIN 1.5* 1.5* 1.6*   No results for input(s): LIPASE, AMYLASE in the last 168 hours. No results for input(s): AMMONIA in the last 168 hours.  CBC:  Recent Labs Lab 11/23/15 1339 11/25/15 0938 11/27/15 0436 11/28/15 0458 11/28/15 1348  WBC 10.6 12.8* 15.7* 15.6* 19.6*  HGB 9.8* 9.0* 9.1* 9.1* 9.0*  HCT 31.7* 29.2* 29.6* 29.8* 29.6*  MCV 91.1 91.8 95.9 93.8 94.8  PLT 119* 99* 92* 94* 81*    Cardiac Enzymes: No results for input(s): CKTOTAL, CKMB, CKMBINDEX, TROPONINI in the last 168 hours.  BNP: Invalid input(s): POCBNP  CBG:  Recent Labs Lab 11/29/15 0023 11/29/15 0728 11/29/15 1608 11/30/15 0028 11/30/15 0748  GLUCAP 95 84 115* 107* 113*    Microbiology: Results for orders placed or performed during the hospital encounter of 08/19/2015  Blood Culture (routine x 2)     Status: None   Collection Time: 07/29/2015  8:51 AM  Result Value Ref Range Status   Specimen Description BLOOD [name]  Final   Special Requests BOTTLES DRAWN AEROBIC AND ANAEROBIC  3CC  Final   Culture NO GROWTH 5 DAYS  Final   Report Status 08/17/2015 FINAL  Final  Blood Culture (routine x 2)     Status: None   Collection Time: 08/07/2015  9:20 AM  Result Value Ref Range Status   Specimen Description BLOOD LEFT ARM  Final   Special Requests BOTTLES DRAWN AEROBIC AND ANAEROBIC  1CC  Final   Culture NO GROWTH 5 DAYS  Final   Report Status 08/17/2015 FINAL  Final  Wound culture     Status: None   Collection Time: 19-Aug-2015  9:20 AM  Result Value Ref Range Status   Specimen Description DECUBITIS  Final   Special Requests Normal  Final   Gram Stain   Final    FEW WBC SEEN MANY GRAM NEGATIVE RODS RARE GRAM POSITIVE COCCI    Culture   Final    HEAVY GROWTH ESCHERICHIA COLI MODERATE GROWTH ENTEROBACTER AEROGENES PROTEUS MIRABILIS HEAVY  GROWTH ENTEROCOCCUS SPECIES VRE HAVE INTRINSIC RESISTANCE TO MOST COMMONLY USED ANTIBIOTICS AND THE ABILITY TO ACQUIRE RESISTANCE TO MOST AVAILABLE ANTIBIOTICS.    Report Status 08/16/2015 FINAL  Final   Organism ID, Bacteria ESCHERICHIA COLI  Final   Organism ID, Bacteria ENTEROBACTER AEROGENES  Final   Organism ID, Bacteria PROTEUS MIRABILIS  Final   Organism ID, Bacteria ENTEROCOCCUS SPECIES  Final      Susceptibility   Enterobacter aerogenes - MIC*    CEFTAZIDIME <=1 SENSITIVE Sensitive     CEFAZOLIN >=64 RESISTANT Resistant     CEFTRIAXONE <=1 SENSITIVE Sensitive     CIPROFLOXACIN <=0.25 SENSITIVE Sensitive     GENTAMICIN <=1 SENSITIVE Sensitive     IMIPENEM 1 SENSITIVE Sensitive     TRIMETH/SULFA <=20 SENSITIVE Sensitive     * MODERATE GROWTH ENTEROBACTER AEROGENES   Escherichia coli - MIC*    AMPICILLIN <=2 SENSITIVE Sensitive     CEFTAZIDIME <=1 SENSITIVE Sensitive     CEFAZOLIN <=4 SENSITIVE Sensitive     CEFTRIAXONE <=1 SENSITIVE Sensitive     CIPROFLOXACIN <=0.25 SENSITIVE Sensitive     GENTAMICIN <=1 SENSITIVE Sensitive     IMIPENEM <=0.25 SENSITIVE Sensitive     TRIMETH/SULFA <=20 SENSITIVE Sensitive     * HEAVY GROWTH ESCHERICHIA COLI   Proteus mirabilis - MIC*    AMPICILLIN >=32 RESISTANT Resistant     CEFTAZIDIME <=1 SENSITIVE Sensitive     CEFAZOLIN 8 SENSITIVE Sensitive     CEFTRIAXONE <=1 SENSITIVE Sensitive     CIPROFLOXACIN <=0.25 SENSITIVE Sensitive     GENTAMICIN <=1 SENSITIVE Sensitive     IMIPENEM 1 SENSITIVE Sensitive     TRIMETH/SULFA <=20 SENSITIVE Sensitive     * PROTEUS MIRABILIS   Enterococcus species - MIC*    AMPICILLIN >=32 RESISTANT Resistant     VANCOMYCIN >=32 RESISTANT Resistant     GENTAMICIN SYNERGY SENSITIVE Sensitive     TETRACYCLINE Value in next row Resistant      RESISTANT>=16    * HEAVY GROWTH ENTEROCOCCUS SPECIES  MRSA PCR Screening     Status: None   Collection Time: 08/19/2015  2:38 PM  Result Value Ref Range Status   MRSA  by PCR NEGATIVE NEGATIVE Final    Comment:        The GeneXpert MRSA Assay (FDA approved for NASAL specimens only), is one component of a comprehensive MRSA colonization surveillance program. It is not intended to diagnose MRSA infection nor to guide or monitor treatment for MRSA infections.   Blood culture (single)     Status: None   Collection Time: 2015/08/19  3:36 PM  Result Value Ref Range Status   Specimen Description BLOOD RIGHT ASSIST CONTROL  Final   Special Requests BOTTLES DRAWN AEROBIC AND ANAEROBIC  Final   Culture NO GROWTH 5 DAYS  Final   Report Status 08/17/2015 FINAL  Final  Wound culture     Status: None   Collection Time: 08/13/15 12:37 PM  Result Value Ref Range Status   Specimen Description WOUND  Final   Special Requests Normal  Final   Gram Stain   Final    FEW WBC SEEN TOO NUMEROUS TO COUNT GRAM NEGATIVE RODS FEW GRAM POSITIVE COCCI    Culture   Final    HEAVY GROWTH ESCHERICHIA COLI MODERATE GROWTH PROTEUS MIRABILIS LIGHT GROWTH KLEBSIELLA PNEUMONIAE MODERATE GROWTH ENTEROCOCCUS GALLINARUM CRITICAL RESULT CALLED TO, READ BACK BY AND VERIFIED WITH: [facility] [place] BORBA AT 1042 08/16/15 DV    Report Status 08/17/2015 FINAL  Final   Organism ID, Bacteria ESCHERICHIA COLI  Final   Organism ID, Bacteria PROTEUS MIRABILIS  Final   Organism ID, Bacteria KLEBSIELLA PNEUMONIAE  Final   Organism ID, Bacteria ENTEROCOCCUS GALLINARUM  Final      Susceptibility   Escherichia coli - MIC*    AMPICILLIN >=32 RESISTANT Resistant     CEFTAZIDIME 4 RESISTANT Resistant     CEFAZOLIN >=64 RESISTANT Resistant     CEFTRIAXONE 16 RESISTANT Resistant     GENTAMICIN 2 SENSITIVE Sensitive     IMIPENEM >=16 RESISTANT Resistant     TRIMETH/SULFA <=20 SENSITIVE Sensitive     Extended ESBL POSITIVE Resistant     PIP/TAZO Value in next row Resistant      RESISTANT>=128    CIPROFLOXACIN Value in next row Sensitive      SENSITIVE<=0.25    * HEAVY GROWTH ESCHERICHIA COLI    Klebsiella pneumoniae - MIC*    AMPICILLIN Value in next row Resistant      SENSITIVE<=0.25    CEFTAZIDIME Value in next row Resistant      SENSITIVE<=0.25    CEFAZOLIN Value in next row Resistant      SENSITIVE<=0.25    CEFTRIAXONE Value in next row Resistant      SENSITIVE<=0.25    CIPROFLOXACIN Value in next row Resistant      SENSITIVE<=0.25    GENTAMICIN Value in next row Sensitive      SENSITIVE<=0.25    IMIPENEM Value in next row Resistant      SENSITIVE<=0.25    TRIMETH/SULFA Value in next row Resistant      SENSITIVE<=0.25    PIP/TAZO Value in next row Resistant      RESISTANT>=128    * LIGHT GROWTH KLEBSIELLA PNEUMONIAE   Proteus mirabilis - MIC*    AMPICILLIN Value in next row Resistant      RESISTANT>=128    CEFTAZIDIME Value in next row Sensitive      RESISTANT>=128    CEFAZOLIN Value in next row Sensitive      RESISTANT>=128    CEFTRIAXONE Value in next row Sensitive      RESISTANT>=128    CIPROFLOXACIN Value in next row Sensitive      RESISTANT>=128    GENTAMICIN Value in next row Sensitive      RESISTANT>=128    IMIPENEM Value in next row Sensitive      RESISTANT>=128    TRIMETH/SULFA Value in next row Sensitive      RESISTANT>=128    PIP/TAZO Value in next row Sensitive      SENSITIVE<=4    * MODERATE GROWTH PROTEUS MIRABILIS   Enterococcus gallinarum - MIC*    AMPICILLIN Value in next row Resistant      SENSITIVE<=4  GENTAMICIN SYNERGY Value in next row Sensitive      SENSITIVE<=4    CIPROFLOXACIN Value in next row Resistant      RESISTANT>=8    TETRACYCLINE Value in next row Resistant      RESISTANT>=16    * MODERATE GROWTH ENTEROCOCCUS GALLINARUM  Wound culture     Status: None   Collection Time: 08/15/15  2:53 PM  Result Value Ref Range Status   Specimen Description WOUND  Final   Special Requests NONE  Final   Gram Stain FEW WBC SEEN NO ORGANISMS SEEN   Final   Culture NO GROWTH 3 DAYS  Final   Report Status 08/18/2015 FINAL  Final   C difficile quick scan w PCR reflex     Status: None   Collection Time: 08/17/15 11:34 AM  Result Value Ref Range Status   C Diff antigen NEGATIVE NEGATIVE Final   C Diff toxin NEGATIVE NEGATIVE Final   C Diff interpretation Negative for C. difficile  Final  Stool culture     Status: None   Collection Time: 09/03/15  3:51 PM  Result Value Ref Range Status   Specimen Description STOOL  Final   Special Requests Immunocompromised  Final   Culture   Final    NO SALMONELLA OR SHIGELLA ISOLATED No Pathogenic E. coli detected NO CAMPYLOBACTER DETECTED    Report Status 09/07/2015 FINAL  Final  C difficile quick scan w PCR reflex     Status: None   Collection Time: 09/13/15 12:51 AM  Result Value Ref Range Status   C Diff antigen NEGATIVE NEGATIVE Final   C Diff toxin NEGATIVE NEGATIVE Final   C Diff interpretation Negative for C. difficile  Final  Culture, blood (routine x 2)     Status: None   Collection Time: 09/16/15 11:24 AM  Result Value Ref Range Status   Specimen Description BLOOD LEFT HAND  Final   Special Requests BOTTLES DRAWN AEROBIC AND ANAEROBIC  1CC  Final   Culture NO GROWTH 5 DAYS  Final   Report Status 09/21/2015 FINAL  Final  Culture, blood (routine x 2)     Status: None   Collection Time: 09/16/15 12:23 PM  Result Value Ref Range Status   Specimen Description BLOOD RIGHT HAND  Final   Special Requests BOTTLES DRAWN AEROBIC AND ANAEROBIC  1CC  Final   Culture  Setup Time   Final    GRAM POSITIVE COCCI AEROBIC BOTTLE ONLY CRITICAL RESULT CALLED TO, READ BACK BY AND VERIFIED WITH: TESS THOMAS,RN 09/17/2015 0631 BY JRS.    Culture   Final    ENTEROCOCCUS FAECALIS AEROBIC BOTTLE ONLY VRE HAVE INTRINSIC RESISTANCE TO MOST COMMONLY USED ANTIBIOTICS AND THE ABILITY TO ACQUIRE RESISTANCE TO MOST AVAILABLE ANTIBIOTICS. CRITICAL RESULT CALLED TO, READ BACK BY AND VERIFIED WITH: CHERYL SMITH AT 1610 09/19/15 DV    Report Status 09/21/2015 FINAL  Final   Organism ID,  Bacteria ENTEROCOCCUS FAECALIS  Final      Susceptibility   Enterococcus faecalis - MIC*    AMPICILLIN <=2 SENSITIVE Sensitive     LINEZOLID 2 SENSITIVE Sensitive     CIPROFLOXACIN Value in next row Resistant      RESISTANT>=8    TETRACYCLINE Value in next row Resistant      RESISTANT>=16    VANCOMYCIN Value in next row Resistant      RESISTANT>=32    GENTAMICIN SYNERGY Value in next row Resistant      RESISTANT>=32    *  ENTEROCOCCUS FAECALIS  Culture, respiratory (NON-Expectorated)     Status: None   Collection Time: 09/16/15  3:50 PM  Result Value Ref Range Status   Specimen Description TRACHEAL ASPIRATE  Final   Special Requests Immunocompromised  Final   Gram Stain   Final    FEW WBC SEEN GOOD SPECIMEN - 80-90% WBCS RARE GRAM NEGATIVE RODS    Culture LIGHT GROWTH PSEUDOMONAS AERUGINOSA  Final   Report Status 09/19/2015 FINAL  Final   Organism ID, Bacteria PSEUDOMONAS AERUGINOSA  Final      Susceptibility   Pseudomonas aeruginosa - MIC*    CEFTAZIDIME 8 SENSITIVE Sensitive     CIPROFLOXACIN 2 INTERMEDIATE Intermediate     GENTAMICIN >=16 RESISTANT Resistant     IMIPENEM >=16 RESISTANT Resistant     PIP/TAZO Value in next row Sensitive      SENSITIVE32    CEFEPIME Value in next row Sensitive      SENSITIVE8    LEVOFLOXACIN Value in next row Resistant      RESISTANT>=8    * LIGHT GROWTH PSEUDOMONAS AERUGINOSA  Culture, expectorated sputum-assessment     Status: None   Collection Time: 09/22/15  2:09 PM  Result Value Ref Range Status   Specimen Description ENDOTRACHEAL  Final   Special Requests Normal  Final   Sputum evaluation THIS SPECIMEN IS ACCEPTABLE FOR SPUTUM CULTURE  Final   Report Status 09/24/2015 FINAL  Final  Culture, respiratory (NON-Expectorated)     Status: None   Collection Time: 09/22/15  2:09 PM  Result Value Ref Range Status   Specimen Description ENDOTRACHEAL  Final   Special Requests Normal Reflexed from [id number]  Final   Gram Stain   Final     FEW WBC SEEN FEW GRAM NEGATIVE RODS POOR SPECIMEN - LESS THAN 70% WBCS    Culture   Final    MODERATE GROWTH PSEUDOMONAS AERUGINOSA LIGHT GROWTH KLEBSIELLA PNEUMONIAE REFER TO SENSITIVITIES FROM PREVIOUS CULTURE FOR ORG 2    Report Status 10/01/2015 FINAL  Final   Organism ID, Bacteria PSEUDOMONAS AERUGINOSA  Final   Organism ID, Bacteria KLEBSIELLA PNEUMONIAE  Final      Susceptibility   Pseudomonas aeruginosa - MIC*    CEFTAZIDIME 4 SENSITIVE Sensitive     CIPROFLOXACIN 2 INTERMEDIATE Intermediate     GENTAMICIN >=16 RESISTANT Resistant     IMIPENEM >=16 RESISTANT Resistant     PIP/TAZO Value in next row Sensitive      SENSITIVE16    * MODERATE GROWTH PSEUDOMONAS AERUGINOSA  Tissue culture     Status: None   Collection Time: 09/24/15  6:44 AM  Result Value Ref Range Status   Specimen Description BONE  Final   Special Requests Normal  Final   Gram Stain MODERATE WBC SEEN FEW GRAM NEGATIVE RODS   Final   Culture   Final    MODERATE GROWTH ESCHERICHIA COLI MODERATE GROWTH KLEBSIELLA PNEUMONIAE ESBL-EXTENDED SPECTRUM BETA LACTAMASE-THE ORGANISM IS RESISTANT TO PENICILLINS, CEPHALOSPORINS AND AZTREONAM ACCORDING TO CLSI M100-S15 VOL.25 N01 JAN 2005. ORGANISM 1 This organism isolate is resistant to one or more antiotic agents in three or more antimicrobial categories.  Suggest Infectious Disease consult.   ORGANISM 2 CRITICAL RESULT CALLED TO, READ BACK BY AND VERIFIED WITH: RN Mardene Celeste LINDSAY 09/27/15 1005AM    Report Status 09/28/2015 FINAL  Final   Organism ID, Bacteria ESCHERICHIA COLI  Final   Organism ID, Bacteria KLEBSIELLA PNEUMONIAE  Final      Susceptibility   Escherichia  coli - MIC*    AMPICILLIN >=32 RESISTANT Resistant     CEFTAZIDIME 4 RESISTANT Resistant     CEFAZOLIN >=64 RESISTANT Resistant     CEFTRIAXONE 8 RESISTANT Resistant     GENTAMICIN <=1 SENSITIVE Sensitive     IMIPENEM 8 RESISTANT Resistant     TRIMETH/SULFA <=20 SENSITIVE Sensitive     Extended  ESBL POSITIVE Resistant     PIP/TAZO Value in next row Resistant      RESISTANT>=128    * MODERATE GROWTH ESCHERICHIA COLI   Klebsiella pneumoniae - MIC*    AMPICILLIN Value in next row Resistant      RESISTANT>=128    CEFTAZIDIME Value in next row Resistant      RESISTANT>=128    CEFAZOLIN Value in next row Resistant      RESISTANT>=128    CEFTRIAXONE Value in next row Resistant      RESISTANT>=128    CIPROFLOXACIN Value in next row Resistant      RESISTANT>=128    GENTAMICIN Value in next row Resistant      RESISTANT>=128    IMIPENEM Value in next row Resistant      RESISTANT>=128    TRIMETH/SULFA Value in next row Sensitive      RESISTANT>=128    PIP/TAZO Value in next row Resistant      RESISTANT>=128    * MODERATE GROWTH KLEBSIELLA PNEUMONIAE  Anaerobic culture     Status: None   Collection Time: 09/24/15  3:55 PM  Result Value Ref Range Status   Specimen Description BONE  Final   Special Requests Normal  Final   Culture NO ANAEROBES ISOLATED  Final   Report Status 09/29/2015 FINAL  Final  Culture, fungus without smear     Status: None   Collection Time: 09/24/15  3:55 PM  Result Value Ref Range Status   Specimen Description BONE  Final   Special Requests Normal  Final   Culture NO FUNGUS ISOLATED AFTER 24 DAYS  Final   Report Status 10/18/2015 FINAL  Final  C difficile quick scan w PCR reflex     Status: None   Collection Time: 10/07/15  2:02 PM  Result Value Ref Range Status   C Diff antigen NEGATIVE NEGATIVE Final   C Diff toxin NEGATIVE NEGATIVE Final   C Diff interpretation Negative for C. difficile  Final  Culture, blood (routine x 2)     Status: None   Collection Time: 10/16/15  9:52 AM  Result Value Ref Range Status   Specimen Description BLOOD LEFT HAND  Final   Special Requests   Final    BOTTLES DRAWN AEROBIC AND ANAEROBIC  AER 6CC ANA 4CC   Culture NO GROWTH 6 DAYS  Final   Report Status 10/22/2015 FINAL  Final  Culture, blood (routine x 2)      Status: None   Collection Time: 10/16/15 12:25 PM  Result Value Ref Range Status   Specimen Description BLOOD LEFT HAND  Final   Special Requests BOTTLES DRAWN AEROBIC AND ANAEROBIC  5CC  Final   Culture NO GROWTH 6 DAYS  Final   Report Status 10/22/2015 FINAL  Final  Culture, expectorated sputum-assessment     Status: None   Collection Time: 10/18/15  1:15 PM  Result Value Ref Range Status   Specimen Description SPUTUM  Final   Special Requests NONE  Final   Sputum evaluation THIS SPECIMEN IS ACCEPTABLE FOR SPUTUM CULTURE  Final   Report Status 10/18/2015 FINAL  Final  Culture, respiratory (NON-Expectorated)     Status: None   Collection Time: 10/18/15  1:15 PM  Result Value Ref Range Status   Specimen Description SPUTUM  Final   Special Requests NONE Reflexed from [id number]  Final   Gram Stain   Final    GOOD SPECIMEN - 80-90% WBCS MODERATE WBC SEEN MANY GRAM NEGATIVE RODS    Culture   Final    HEAVY GROWTH PSEUDOMONAS AERUGINOSA MODERATE GROWTH KLEBSIELLA PNEUMONIAE CRITICAL RESULT CALLED TO, READ BACK BY AND VERIFIED WITHRod Mae AT 4098 10/21/15 DV KLEBSIELLA PNEUMONIAE This organism isolate is resistant to one or more antiotic agents in three or more antimicrobial categories.  Suggest Infectious Disease  consult.      Report Status 10/22/2015 FINAL  Final   Organism ID, Bacteria PSEUDOMONAS AERUGINOSA  Final   Organism ID, Bacteria KLEBSIELLA PNEUMONIAE  Final      Susceptibility   Klebsiella pneumoniae - MIC*    AMPICILLIN >=32 RESISTANT Resistant     CEFAZOLIN >=64 RESISTANT Resistant     CEFTRIAXONE >=64 RESISTANT Resistant     CIPROFLOXACIN >=4 RESISTANT Resistant     GENTAMICIN >=16 RESISTANT Resistant     IMIPENEM >=16 RESISTANT Resistant     NITROFURANTOIN >=512 RESISTANT Resistant     TRIMETH/SULFA <=20 SENSITIVE Sensitive     * MODERATE GROWTH KLEBSIELLA PNEUMONIAE   Pseudomonas aeruginosa - MIC*    CEFTAZIDIME 4 SENSITIVE Sensitive     CIPROFLOXACIN  >=4 RESISTANT Resistant     GENTAMICIN 8 INTERMEDIATE Intermediate     IMIPENEM >=16 RESISTANT Resistant     PIP/TAZO Value in next row Sensitive      SENSITIVE32    * HEAVY GROWTH PSEUDOMONAS AERUGINOSA  C difficile quick scan w PCR reflex     Status: None   Collection Time: 10/18/15  4:39 PM  Result Value Ref Range Status   C Diff antigen NEGATIVE NEGATIVE Final   C Diff toxin NEGATIVE NEGATIVE Final   C Diff interpretation Negative for C. difficile  Final  Culture, blood (Routine X 2) w Reflex to ID Panel     Status: None   Collection Time: 11/06/15  8:27 AM  Result Value Ref Range Status   Specimen Description BLOOD RIGHT HAND  Final   Special Requests BOTTLES DRAWN AEROBIC AND ANAEROBIC 3CC  Final   Culture NO GROWTH 5 DAYS  Final   Report Status 11/11/2015 FINAL  Final  Culture, blood (Routine X 2) w Reflex to ID Panel     Status: None   Collection Time: 11/06/15  8:29 AM  Result Value Ref Range Status   Specimen Description BLOOD LEFT HAND  Final   Special Requests BOTTLES DRAWN AEROBIC AND ANAEROBIC  1CC  Final   Culture NO GROWTH 5 DAYS  Final   Report Status 11/11/2015 FINAL  Final  Culture, expectorated sputum-assessment     Status: None   Collection Time: 11/06/15  8:41 AM  Result Value Ref Range Status   Specimen Description EXPECTORATED SPUTUM  Final   Special Requests Immunocompromised  Final   Sputum evaluation THIS SPECIMEN IS ACCEPTABLE FOR SPUTUM CULTURE  Final   Report Status 11/06/2015 FINAL  Final  Culture, respiratory (NON-Expectorated)     Status: None   Collection Time: 11/06/15  8:41 AM  Result Value Ref Range Status   Specimen Description EXPECTORATED SPUTUM  Final   Special Requests Immunocompromised Reflexed from [id number]  Final   Gram Stain  Final    MANY WBC SEEN MANY GRAM NEGATIVE RODS EXCELLENT SPECIMEN - 90-100% WBCS    Culture   Final    MODERATE GROWTH KLEBSIELLA PNEUMONIAE MODERATE GROWTH PSEUDOMONAS AERUGINOSA CRITICAL RESULT CALLED  TO, READ BACK BY AND VERIFIED WITH: DR. FITZGERALD AT 1120 11/09/15 DV DR. FITZGERALD REQUESTED ORGANISM BE SENT OUT FOR TIGECYCLINE CEFT/AVIBACTAM AND COLISTIN Sent to [facility] for further susceptibility testing. 11/11/15 CTJ    Report Status 11/15/2015 FINAL  Final   Organism ID, Bacteria KLEBSIELLA PNEUMONIAE  Final   Organism ID, Bacteria PSEUDOMONAS AERUGINOSA  Final      Susceptibility   Klebsiella pneumoniae - MIC*    AMPICILLIN >=32 RESISTANT Resistant     CEFAZOLIN >=64 RESISTANT Resistant     CEFTRIAXONE 32 RESISTANT Resistant     CIPROFLOXACIN >=4 RESISTANT Resistant     GENTAMICIN >=16 RESISTANT Resistant     IMIPENEM >=16 RESISTANT Resistant     NITROFURANTOIN 256 RESISTANT Resistant     TRIMETH/SULFA >=320 RESISTANT Resistant     Extended ESBL POSITIVE Resistant     * MODERATE GROWTH KLEBSIELLA PNEUMONIAE   Pseudomonas aeruginosa - MIC*    CEFTAZIDIME >=64 RESISTANT Resistant     CIPROFLOXACIN 2 INTERMEDIATE Intermediate     GENTAMICIN >=16 RESISTANT Resistant     IMIPENEM >=16 RESISTANT Resistant     * MODERATE GROWTH PSEUDOMONAS AERUGINOSA  Culture, respiratory (NON-Expectorated)     Status: None   Collection Time: 11/14/15  2:07 PM  Result Value Ref Range Status   Specimen Description TRACHEAL ASPIRATE  Final   Special Requests NONE  Final   Gram Stain   Final    EXCELLENT SPECIMEN - 90-100% WBCS MANY WBC SEEN MODERATE GRAM NEGATIVE RODS    Culture   Final    HEAVY GROWTH PSEUDOMONAS AERUGINOSA CRITICAL VALUE NOTED.  VALUE IS CONSISTENT WITH PREVIOUSLY REPORTED AND CALLED VALUE.    Report Status 11/16/2015 FINAL  Final   Organism ID, Bacteria PSEUDOMONAS AERUGINOSA  Final      Susceptibility   Pseudomonas aeruginosa - MIC*    CEFTAZIDIME >=64 RESISTANT Resistant     CIPROFLOXACIN 2 INTERMEDIATE Intermediate     GENTAMICIN >=16 RESISTANT Resistant     IMIPENEM >=16 RESISTANT Resistant     PIP/TAZO Value in next row Resistant      RESISTANT>=128     TOBRAMYCIN Value in next row Sensitive      SENSITIVE2    * HEAVY GROWTH PSEUDOMONAS AERUGINOSA    Coagulation Studies: No results for input(s): LABPROT, INR in the last 72 hours.  Urinalysis: No results for input(s): COLORURINE, LABSPEC, PHURINE, GLUCOSEU, HGBUR, BILIRUBINUR, KETONESUR, PROTEINUR, UROBILINOGEN, NITRITE, LEUKOCYTESUR in the last 72 hours.  Invalid input(s): APPERANCEUR    Imaging: No results found.   Medications:   . dextrose 25 mL/hr at 11/30/15 0800  . feeding supplement (VITAL AF 1.2 CAL) 1,000 mL (11/30/15 0800)  . norepinephrine (LEVOPHED) Adult infusion 2.987 mcg/min (11/30/15 0700)   . albumin human  12.5 g Intravenous Once  . albumin human  12.5 g Intravenous Once  . albuterol  2.5 mg Nebulization Q6H  . amiodarone  100 mg Per Tube Daily  . antiseptic oral rinse  7 mL Mouth Rinse BID  . antiseptic oral rinse  7 mL Mouth Rinse QID  . aspirin  81 mg Per Tube Daily  . chlorhexidine gluconate  15 mL Mouth Rinse BID  . colistimethate (COLISTIN) IV  250 mg Intravenous Q48H  . epoetin (EPOGEN/PROCRIT)  injection  10,000 Units Intravenous Q M,W,F-HD  . escitalopram  10 mg Per Tube Daily  . feeding supplement (PRO-STAT SUGAR FREE 64)  30 mL Per Tube 6 X Daily  . free water  20 mL Per Tube 6 times per day  . heparin subcutaneous  5,000 Units Subcutaneous Q12H  . hydrocortisone  10 mg Per Tube BID  . lidocaine  1 patch Transdermal Q24H  . midodrine  10 mg Per Tube TID WC  . mirtazapine  7.5 mg Per Tube QHS  . multivitamin  5 mL Per Tube Daily  . pantoprazole sodium  40 mg Per Tube Daily  . sodium chloride  10-40 mL Intracatheter Q12H  . sodium hypochlorite   Irrigation BID  . tigecycline (TYGACIL) IVPB  50 mg Intravenous Q12H  . tobramycin (PF)  300 mg Nebulization BID   acetaminophen (TYLENOL) oral liquid 160 mg/5 mL, albuterol, HYDROmorphone (DILAUDID) injection, loperamide, ondansetron (ZOFRAN) IV, oxyCODONE, polyvinyl alcohol, sodium chloride, zinc  oxide  Assessment/ Plan:  50 y.o. 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 Admitted on August 20, 2015  Echo: 11/07/19: EF of 30% to 35%.   1. End-stage renal disease on hemodialysis on HD MWF. The patient has been on dialysis since October of 2014. R IJ permcath.  - Dialysis treatment with IV albumin for oncotic support. Slow BFR   - Midodrine and hydrocortisone ordered for blood pressure support.  - Continue MWF schedule, monitor daily for dialysis treatment.  - Overall prognosis is extremely poor - In my opinion she will not be able to get well enough for an outpatient dialysis placement  2. Anemia of CKD. Hemoglobin 9.0. Multiple transfusions during admission   - Epogen and periodic blood transfusions. Not a candidate for IV iron due to concurrent infections  3. Acute resp failure: ventilator dependent -prolonged course on ventilator, fio2 currently 40% with PEEP of 5.    4. Sepsis: pseudomonas in tracheal aspirate - tobramycin inhaled  - tigecycline, erythromycin    5. Secondary Hyperparathyroidism: PTH 50, phos 6.1 - not currently on a binder or vitamin D agent.   6. Anasarca - UF as with HD as tolerated    LOS: 110 Nellie Pester 1/11/20179:39 AM

## 2015-11-30 NOTE — Progress Notes (Signed)
Pt has been more withdrawn today.  Grips to commands with left hand. Nods yes/no to pain. HD done this afternoon with 1L removed. Some periods of increased ectopy and levophed increased from to 4 mcg during HD.  Dilaudid x1 for c/o pain. Labs sent during dialysis and results pending

## 2015-11-30 NOTE — Progress Notes (Signed)
HD Tx Termination 

## 2015-12-01 DIAGNOSIS — J986 Disorders of diaphragm: Secondary | ICD-10-CM

## 2015-12-01 LAB — GLUCOSE, CAPILLARY
GLUCOSE-CAPILLARY: 134 mg/dL — AB (ref 65–99)
Glucose-Capillary: 132 mg/dL — ABNORMAL HIGH (ref 65–99)
Glucose-Capillary: 150 mg/dL — ABNORMAL HIGH (ref 65–99)

## 2015-12-01 MED ORDER — VITAL AF 1.2 CAL PO LIQD
1000.0000 mL | ORAL | Status: DC
  Administered 2015-12-03 – 2015-12-04 (×2): 1000 mL

## 2015-12-01 NOTE — Progress Notes (Signed)
PULMONARY / CRITICAL CARE MEDICINE   Name: Courtney [name] MRN: [id number] DOB: [birth year]    ADMISSION DATE:  November 21, 2014  BRIEF HISTORY: 49 AAF who has been in medical facilities (hosp, LTAC, rehab) for 2 yrs following gastric bypass surgery with multiple complications. Now with chronic trach, ESRD, profound debilitation, severe sacral pressure ulcer. Was seemingly making progress and transferred to rehab facility approx one week prior to this admission. She was sent to [facility] ED with AMS and hypotension. Working dx of severe sepsis/septic shock due to infected sacral pressure ulcer. Since admission. Her course is been very complicated with numerous complications including septic shock and GI bleeding. Now with failure to wean from vent and possible MI event, repeat ECHO EF 30-35, possible old MI, severe hypokinesis of the anterior and anteroseptal myocardium   SUBJ: lethargic today, she cannot provide a history, she is arousable to physical stimulation. Remains on vent-unable to wean Placed on vasopressors, guppy breathing Failed  PS mode trial, wound dressing again today-pain meds given H/h stable, WBC 19>>13  Summary of MAJOR EVENTS/TEST RESULTS: Admission 02/07/14-05/07/14 Admission 07/21/14-09/06/14 Discharged to [facility]. Pt had palliative consult at that time, were asked to sign off by husband.  09/23 CT head: NAD 09/23 EEG: no epileptiform activity 09/23 PRBCs for Hgb 6.4 09/24 bedside debridement of sacral wound. Abscess drained 09/25 Off vasopressors. More alert. No distress. Worsening thrombocytopenia. Vanc DC'd 09/29 Dr. [name] (I.D) excused from the case by patient's husband. 10/02 MRI -multiple infarcts 10/03 tracheal bleeding- transfused platelets 10/03 hospitalist service excused from the case by patient's husband 10/03 Echocardiogram ejection fraction was 55-60%, pulmonary systolic pressure was 39 mmHg 57/8410/08 restart TF's at lower rate, attempt reg HD  10/12  Transferred to med-surg floor. Remains on PCCM service 10/14 SLP eval: pt unable to tolerate PMV adequately 10/17-will re-attempt PM valve-discussed with Speech therapist 10/18 passed swallow eval-start pureed thick foods no thin liquids-continue NG feeds 10/19 transferred to step down for sepsis/aspiration pneumonia 10/19 cxr shows RLL opacity 10/21 sacral decub debride at bedside by surgery 10/22 started back on vasopressors while on HD 10/27 CT with osteo, R hip fx, unable to identify tip of dubhoff tube - sent for fluoro study 10/28 Ortho consultation: I do not feel that she is a surgical candidate. Therefore, I feel that it would best to manage this fracture nonsurgically and allow it to heal by itself over time, which it should.  10/28 Gen Surg consultation: Due to the lack of free air or free flow of contrast and the peritoneum there is no indication for any surgical intervention on this. Would recommend pulling back the feeding tube 1-2 cm. Would recheck an abdominal film to confirm no pneumoperitoneum in the morning. Absence of any changes okay to continue using Dobbhoff for feeding and medications. 10/28 gastrograffin study: The study confirms that the feeding tube pes perforated through the duodenum and the tip is within a cavity that fills with injected contrast. The cavity does appear walled off 11/4 refuses oral feeds, continue TF's CT reviewed: T8-T9 discitis/osteomyelitis, RT HIP FRACTURE 11/5 placed back on vasopressors, placed back on Vent due to resp acidosis, levophed turned off 11/6 afternoon 11/5 PM Trach changed out due to cuff leak, #6 Shiley cuffed, 11/6 dubhoff occluded, removed and replaced, new dubhoff shows tube in the antral stomach 11/15 refusing to take oral feeds 11/18 placed back on Vent for increased WOB,SOB. 11/28 Wound care as re -consulted, there appears to be a new pocket/fistula around the area of  her stage IV sacral wound. 10/18/2015; the patient developed  a new left basal pneumonia; with recurrent sepsis.  10/19/2015; fevers resolved with IV acetaminophen. Tube feeds changed from vital to Jevity.  12/7 CXR with stable b/l opacities.  12/12 elevated K s/p HD-remains on AC mode 12/16-vomiting-hold feeds and re-assess in next 24 hrs 12/17- TF restarted at 1/2 goal (goal = 50cc/per) 12/18- elevated wbc, fever (101.4), recultured, restarted on ceftaz 12/19 Pericardial friction rub. Echocardiogram: LVEF 30-35%, anteroseptal hypokinesis or akinesis c/w anterior wall MI 12/20 Dr Sung Amabile discussed new echocardiogram findings with pt's husband and explained that this likely represents an anterior wall MI sometime in the previous couple of weeks. It was explained that she is not a candidate for any cardiology intervention. Code status was again discussed. Husband expressed understanding that she was not going to survive this illness in any favorable way but continues to request that she be full code status based on what he believes to be her wishes 12/21 vomitied 50cc of TF - AM TF held, restart TF at 3pm @30cc  12/26 Persistent intermittent trach cuff leak. Distal XLT trach requested.  12/27 15 beat NSVT 12/28 patient refused HD and refused Bath time 12/29 D10 infusion for hypoglycemia, TF's on hold due to intolerance 12/30 CODE BLUE  VTACH 01/01 transfuse 2 units of blood 01/02 increased respiratory distress, copious purulent secretions 01/02 LE venous US: No DVT 01/03 Repeat TTE: The cavity size was mildly dilated. Systolic function was moderately to severely reduced. The estimated ejection fraction was in the range of 30% to 35%. Diffuse hypokinesis. Moderate to severe hypokinesis of the anteroseptal, anterior, and apical myocardium. Hypokinesis of the anterior myocardium 01/05 Hypothermic - Bair Hugger ordered 01/10-placed back on vasopressors for septic shock 1/11-failed SBT due to resp muscle fatigue  INDWELLING DEVICES:: Trach (chronic)  placed June 2014 Tunneled R IJ HD cath (chronic) Tunneled L IJ CVL (chronic) L femoral A-line 9/23 >> 9/25  MICRO DATA: 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 Pneumoniae, P. Mirabilis, VRE Wound 9/26 >> No growth Blood 9/23 >> NEG CDiff 9/27>>neg Stool Cx 10/15>> negative Cdiff 10/25>>neg Trach Aspirate 10/28>> light growth pseudomonas Blood 10/28 >> 1/2 GPC >>  Sputum cultures obtained 09/22/15 due to mucus plugging>>Pseudomonas BONE TISSUE Cx 11/3>>E.coli (ESBL), k. Pneumonia (daptomycin and septra) Bld Cx 11/27>>negative thus far.  Trach Asp 11/27>> grew out Pseudomonas, and Klebsiella pneumonia. Trach Asp 12/18>> MDR Klebsiella, Pseudomonas Bld Cx 12/18>> NEG 12/26 resp culture >> MDR pseudomonas  ANTIMICROBIALS:  Aztreonam 9/23 >> 9/24 Vanc 9/23 >> 9/25 Vanc 9/26>>9/27 Daptomycin 9/27>> 10/12, 10/28>> off Meropenem 9/23 >> 10/14, 10/28 >> 10/31 Zosyn 11/27,>> 11/30. Levaquin 11/29>> 11/29. Ceftaz 12/1>>12/11, 12/18>> 12/24 Colisitin 12/26 >>  Tigecycline 12/26 >>  Nebulized tobramycin 12/30 >>  VITAL SIGNS: Temp:  [94.8 F (34.9 C)-97.6 F (36.4 C)] 97.2 F (36.2 C) (01/12 0800) Pulse Rate:  [38-80] 71 (01/12 0930) Resp:  [14-42] 14 (01/12 0930) BP: (86-144)/(35-79) 109/50 mmHg (01/12 0930) SpO2:  [92 %-100 %] 97 % (01/12 0930) FiO2 (%):  [28 %] 28 % (01/12 0804) HEMODYNAMICS:   VENTILATOR SETTINGS: Vent Mode:  [-] PRVC FiO2 (%):  [28 %] 28 % Set Rate:  [14 bmp] 14 bmp Vt Set:  [500 mL] 500 mL PEEP:  [5 cmH20] 5 cmH20 INTAKE / OUTPUT:  Intake/Output Summary (Last  24 hours) at 12/01/15 1441 Last data filed at 12/01/15 0900  Gross per 24 hour  Intake 1466.15 ml  Output   1000 ml  Net 466.15 ml    Review of Systems  Unable to perform  ROS  Physical Exam  RASS 0, intermittent trach tube cuff leak,vent graphics look good, no air leak  HEENT; Cushingoid facies, temporal wasting, NGT present. Tube feeds are infusing. Trach site clean Diffuse rhonchi Reg with occ extrasystoles, no M Abdomen soft, + BS Ext cool, trace symmetric edema, marked muscle wasting Severe sacral decubitus ulcer- to the spine  LABS:  CBC  Recent Labs Lab 11/28/15 0458 11/28/15 1348 11/30/15 1633  WBC 15.6* 19.6* 13.3*  HGB 9.1* 9.0* 8.9*  HCT 29.8* 29.6* 29.7*  PLT 94* 81* 68*   Coag's No results for input(s): APTT, INR in the last 168 hours. BMET  Recent Labs Lab 11/25/15 0938 11/28/15 1348 11/30/15 1633  NA 133* 129* 129*  K 3.5 3.8 3.9  CL 97* 95* 97*  CO2 27 26 27   BUN 72* 102* 95*  CREATININE 1.13* 1.45* 1.38*  GLUCOSE 78 109* 140*   Electrolytes  Recent Labs Lab 11/25/15 0938 11/28/15 1348 11/30/15 1633  CALCIUM 7.8* 8.0* 8.0*  PHOS 4.9* 6.1*  --    Sepsis Markers No results for input(s): LATICACIDVEN, PROCALCITON, O2SATVEN in the last 168 hours. ABG No results for input(s): PHART, PCO2ART, PO2ART in the last 168 hours. Liver Enzymes  Recent Labs Lab 11/25/15 0938 11/28/15 1348  ALBUMIN 1.5* 1.6*   Cardiac Enzymes No results for input(s): TROPONINI, PROBNP in the last 168 hours. Glucose  Recent Labs Lab 11/29/15 1608 11/30/15 0028 11/30/15 0748 11/30/15 1612 11/30/15 2324 12/01/15 0726  GLUCAP 115* 107* 113* 139* 142* 134*    Imaging No results found.  ASSESSMENT / PLAN: IMPRESSION: Very prolonged and complicated hospitalization after gastric bypass surgery 2014 Never returned home ESRD-hd as tolerated Recurrent severe sepsis - PAN resistant Klebisella in sputum Recurrent PNA-now with pseudomonas H/O C diff H/O VTE DM2 - controlled Episodic hypoglycemia Chronic steroid therapy, for a history of of adrenal insufficiency Incidental finding of R hip fx - conservative  mgmt. 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 Husband declined TEE Profound deconditioning Chronic pain New finding of pericardial friction rub 12/19 - resolved New finding of cardiomyopathy, likely post MI 12/19  PAF/flutter Sinus bradycardia HCAP with MDR Donalee Citrin and pseudomonas Chronic anemia Severe protein - calorie malnutrition TF intolerance Mild thrombocytopenia 01/06  Cont vent support - settings reviewed and/or adjusted Cont vent bundle Cont HD per renal Monitor BMET intermittently Monitor I/Os Correct electrolytes as indicated DVT px: SQ heparin Monitor CBC intermittently Transfuse per usual guidelines Monitor temp, WBC count Micro and abx as above ID following - appreciate recs Cont Amiodarone - 100 mg per tube daily DC metoprolol Cont opiates PRN Cont D10 Infusion for hypoglycemia TFs @  30 cc/hr Vasopressors to keep MAP>60   Prognosis is very poor without hope for functional recovery. Patient is dialysis and Vent dependent with severe sacral decub ulcer with chronic bone infection and spinal infection with underlying CHF and CVA with recurrent pneumonia.   This patient in my opinion is deemed Futile. Husband understands but unrealistic  of goals of care  Patient with intermittent delirium with chronic sepsis and can not make decisions for herself.   Would recommend DNR status and Comfort care measures  I have personally obtained a history, examined the patient, evaluated Pertinent laboratory and RadioGraphic/imaging results, and  formulated the assessment and plan   The Patient requires high complexity decision making for assessment and support, frequent  evaluation and titration of therapies, application of advanced monitoring technologies and extensive interpretation of multiple databases. Critical Care Time devoted to patient care services described in this note is 35 minutes.   Overall, patient is critically ill, prognosis is guarded.  Patient with Multiorgan failure and at high risk for cardiac arrest and death.    Lucie Leather, M.D.  [facility]  Medical Director [facility] Medical Director [facility] Cardio-Pulmonary Department

## 2015-12-01 NOTE — Progress Notes (Signed)
ANTIBIOTIC CONSULT NOTE - follow Up  Pharmacy Consult for Colistin and Tigecycline  Indication: MDR Respiratory Infection   Allergies  Allergen Reactions  . Contrast Media [Iodinated Diagnostic Agents] Anaphylaxis  . Ampicillin Rash   Patient Measurements: Height:  (SCALE BROKE) Weight:  (No bedscale) IBW/kg (Calculated) : 66.2  Vital Signs: Temp: 98.2 F (36.8 C) (01/12 1900) Temp Source: Axillary (01/12 1900) BP: 120/75 mmHg (01/12 2200) Pulse Rate: 34 (01/12 2200) Intake/Output from previous day: 01/11 0701 - 01/12 0700 In: 1749.1 [I.V.:719.1; NG/GT:780; IV Piggyback:250] Out: 1000  Intake/Output from this shift: Total I/O In: 140 [I.V.:20; NG/GT:20; IV Piggyback:100] Out: -   Recent Labs  11/30/15 1633  WBC 13.3*  HGB 8.9*  PLT 68*  CREATININE 1.38*   Estimated Creatinine Clearance: 62 mL/min (by C-G formula based on Cr of 1.38). No results for input(s): VANCOTROUGH, VANCOPEAK, VANCORANDOM, GENTTROUGH, GENTPEAK, GENTRANDOM, TOBRATROUGH, TOBRAPEAK, TOBRARND, AMIKACINPEAK, AMIKACINTROU, AMIKACIN in the last 72 hours.   Microbiology: Recent Results (from the past 720 hour(s))  Culture, blood (Routine X 2) w Reflex to ID Panel     Status: None   Collection Time: 11/06/15  8:27 AM  Result Value Ref Range Status   Specimen Description BLOOD RIGHT HAND  Final   Special Requests BOTTLES DRAWN AEROBIC AND ANAEROBIC 3CC  Final   Culture NO GROWTH 5 DAYS  Final   Report Status 11/11/2015 FINAL  Final  Culture, blood (Routine X 2) w Reflex to ID Panel     Status: None   Collection Time: 11/06/15  8:29 AM  Result Value Ref Range Status   Specimen Description BLOOD LEFT HAND  Final   Special Requests BOTTLES DRAWN AEROBIC AND ANAEROBIC  1CC  Final   Culture NO GROWTH 5 DAYS  Final   Report Status 11/11/2015 FINAL  Final  Culture, expectorated sputum-assessment     Status: None   Collection Time: 11/06/15  8:41 AM  Result Value Ref Range Status   Specimen Description  EXPECTORATED SPUTUM  Final   Special Requests Immunocompromised  Final   Sputum evaluation THIS SPECIMEN IS ACCEPTABLE FOR SPUTUM CULTURE  Final   Report Status 11/06/2015 FINAL  Final  Culture, respiratory (NON-Expectorated)     Status: None   Collection Time: 11/06/15  8:41 AM  Result Value Ref Range Status   Specimen Description EXPECTORATED SPUTUM  Final   Special Requests Immunocompromised Reflexed from [id number]  Final   Gram Stain   Final    MANY WBC SEEN MANY GRAM NEGATIVE RODS EXCELLENT SPECIMEN - 90-100% WBCS    Culture   Final    MODERATE GROWTH KLEBSIELLA PNEUMONIAE MODERATE GROWTH PSEUDOMONAS AERUGINOSA CRITICAL RESULT CALLED TO, READ BACK BY AND VERIFIED WITH: DR. FITZGERALD AT 1120 11/09/15 DV DR. FITZGERALD REQUESTED ORGANISM BE SENT OUT FOR TIGECYCLINE CEFT/AVIBACTAM AND COLISTIN Sent to [place] for further susceptibility testing. 11/11/15 CTJ    Report Status 11/15/2015 FINAL  Final   Organism ID, Bacteria KLEBSIELLA PNEUMONIAE  Final   Organism ID, Bacteria PSEUDOMONAS AERUGINOSA  Final      Susceptibility   Klebsiella pneumoniae - MIC*    AMPICILLIN >=32 RESISTANT Resistant     CEFAZOLIN >=64 RESISTANT Resistant     CEFTRIAXONE 32 RESISTANT Resistant     CIPROFLOXACIN >=4 RESISTANT Resistant     GENTAMICIN >=16 RESISTANT Resistant     IMIPENEM >=16 RESISTANT Resistant     NITROFURANTOIN 256 RESISTANT Resistant     TRIMETH/SULFA >=320 RESISTANT Resistant     Extended  ESBL POSITIVE Resistant     * MODERATE GROWTH KLEBSIELLA PNEUMONIAE   Pseudomonas aeruginosa - MIC*    CEFTAZIDIME >=64 RESISTANT Resistant     CIPROFLOXACIN 2 INTERMEDIATE Intermediate     GENTAMICIN >=16 RESISTANT Resistant     IMIPENEM >=16 RESISTANT Resistant     * MODERATE GROWTH PSEUDOMONAS AERUGINOSA  Culture, respiratory (NON-Expectorated)     Status: None   Collection Time: 11/14/15  2:07 PM  Result Value Ref Range Status   Specimen Description TRACHEAL ASPIRATE  Final   Special  Requests NONE  Final   Gram Stain   Final    EXCELLENT SPECIMEN - 90-100% WBCS MANY WBC SEEN MODERATE GRAM NEGATIVE RODS    Culture   Final    HEAVY GROWTH PSEUDOMONAS AERUGINOSA CRITICAL VALUE NOTED.  VALUE IS CONSISTENT WITH PREVIOUSLY REPORTED AND CALLED VALUE.    Report Status 11/16/2015 FINAL  Final   Organism ID, Bacteria PSEUDOMONAS AERUGINOSA  Final      Susceptibility   Pseudomonas aeruginosa - MIC*    CEFTAZIDIME >=64 RESISTANT Resistant     CIPROFLOXACIN 2 INTERMEDIATE Intermediate     GENTAMICIN >=16 RESISTANT Resistant     IMIPENEM >=16 RESISTANT Resistant     PIP/TAZO Value in next row Resistant      RESISTANT>=128    TOBRAMYCIN Value in next row Sensitive      SENSITIVE2    * HEAVY GROWTH PSEUDOMONAS AERUGINOSA    Medical History: Past Medical History  Diagnosis Date  . Obesity   . Dyslipidemia   . Hypertension   . Coronary artery disease     s/p BMS 2010 LAD  . Dysrhythmia     ventricular tachycardia resolved after LAD stent and beta blocker  . Diabetes mellitus     with retinopathy, neuropathy and microalbuminemia  . 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 Patient is on day 112 of hospital stay, and has been treated for multiple infections during this time.   11/06/15: Respiratory cultures showing Pan-resistant  Klebsiella pneumoniae and Pseudomonas aeruginosa  Sputum has been sent to [facility] for susceptibility testing per Dr. Blane Ohara request.   Patient has increased respiratory secretions, leukocytosis and low grade fever   12/30: Alk phos elevated at 1309  Plan:  Per microbiology, Klebsiella is sensitive to Tygacil. Sensitivity results for Avycaz and colistin are still pending.   Continue tigecycline 52m IV q12h.   Continue  colistimethate 2534m(~2.5 mg/kg) IV q48 hours.   Patient should be monitored for  nephrotoxicity and neurotoxicity.  Pharmacy to continue to follow per consult.      ScUlice DashPharmD Clinical Pharmacist   12/01/2015 11:14 PM

## 2015-12-01 NOTE — Progress Notes (Signed)
MEDICATION RELATED CONSULT NOTE - Follow up   Pharmacy Consult for Renal dosing  Allergies  Allergen Reactions  . Contrast Media [Iodinated Diagnostic Agents] Anaphylaxis  . Ampicillin Rash   Patient Measurements: Ht 315ft 9in Height:  (SCALE BROKE) Weight:  (No bedscale) IBW/kg (Calculated) : 66.2  Vital Signs: Temp: 98.2 F (36.8 C) (01/12 1900) Temp Source: Axillary (01/12 1900) BP: 120/75 mmHg (01/12 2200) Pulse Rate: 34 (01/12 2200)  Recent Labs  11/30/15 1633  WBC 13.3*  HGB 8.9*  HCT 29.7*  PLT 68*  CREATININE 1.38*   Estimated Creatinine Clearance: 62 mL/min (by C-G formula based on Cr of 1.38).  Assessment: 50 yo patient on HD. Pharmacy consulted for renal dosing of medications.   Plan:  No medications require adjustment at present. Pharmacy will continue to monitor and dose adjust as needed.     Charlies SilversMichael Simpson, PharmD   12/01/2015

## 2015-12-01 NOTE — Progress Notes (Signed)
Subjective:  Patient remains critically ill Did not respond to voice commands today BP remains borderline low 2000 cc removed with HD on Monday, 1500 on Wednesday   HEMODIALYSIS FLOWSHEET: 1/11  Blood Flow Rate (mL/min): 250 mL/min Arterial Pressure (mmHg): -120 mmHg Venous Pressure (mmHg): 180 mmHg Transmembrane Pressure (mmHg): 40 mmHg Ultrafiltration Rate (mL/min): 350 mL/min Dialysate Flow Rate (mL/min): 600 ml/min Conductivity: Machine : 14.1 Conductivity: Machine : 14.1 Dialysis Fluid Bolus: Normal Saline Bolus Amount (mL): 200 mL Dialysate Change: Other (comment) (3k) Intra-Hemodialysis Comments: uf removed & tx terminated with rinseabck. PT remains on vent via intubation & opens eyes easily to verbal stimuli.     Objective:  Vital signs in last 24 hours:  Temp:  [94.8 F (34.9 C)-97.6 F (36.4 C)] 97.5 F (36.4 C) (01/12 1200) Pulse Rate:  [34-80] 71 (01/12 1545) Resp:  [9-42] 25 (01/12 1545) BP: (86-144)/(32-79) 117/49 mmHg (01/12 1545) SpO2:  [92 %-100 %] 96 % (01/12 1545) FiO2 (%):  [28 %] 28 % (01/12 1500)  Weight change:  Filed Weights   11/21/15 0500 11/21/15 1540 11/22/15 0500  Weight: 107 kg (235 lb 14.3 oz) 107 kg (235 lb 14.3 oz) 102 kg (224 lb 13.9 oz)    Intake/Output: I/O last 3 completed shifts: In: 2626.6 [I.V.:1056.6; NG/GT:1320; IV Piggyback:250] Out: 1000 [Other:1000]   Intake/Output this shift:  Total I/O In: 502.4 [I.V.:222.4; NG/GT:280] Out: -   Physical Exam: General: critically ill   Head/ENT: Moist oral mucosal membrane, +NG tube  Eyes: Anicteric, eyes closed  Neck: Tracheostomy in place  Lungs:  vent assisted PRVC fio2 40%  Heart: Regular with ectopic beats  Abdomen:  Soft, nontender, BS present  Extremities:   anasarca  Neurologic: Resting comfortably,    Access:  R IJ permcath.       Basic Metabolic Panel:  Recent Labs Lab 11/25/15 0938 11/28/15 1348 11/30/15 1633  NA 133* 129* 129*  K 3.5 3.8 3.9   CL 97* 95* 97*  CO2 27 26 27   GLUCOSE 78 109* 140*  BUN 72* 102* 95*  CREATININE 1.13* 1.45* 1.38*  CALCIUM 7.8* 8.0* 8.0*  PHOS 4.9* 6.1*  --     Liver Function Tests:  Recent Labs Lab 11/25/15 0938 11/28/15 1348  ALBUMIN 1.5* 1.6*   No results for input(s): LIPASE, AMYLASE in the last 168 hours. No results for input(s): AMMONIA in the last 168 hours.  CBC:  Recent Labs Lab 11/25/15 0938 11/27/15 0436 11/28/15 0458 11/28/15 1348 11/30/15 1633  WBC 12.8* 15.7* 15.6* 19.6* 13.3*  NEUTROABS  --   --   --   --  11.4*  HGB 9.0* 9.1* 9.1* 9.0* 8.9*  HCT 29.2* 29.6* 29.8* 29.6* 29.7*  MCV 91.8 95.9 93.8 94.8 97.5  PLT 99* 92* 94* 81* 68*    Cardiac Enzymes: No results for input(s): CKTOTAL, CKMB, CKMBINDEX, TROPONINI in the last 168 hours.  BNP: Invalid input(s): POCBNP  CBG:  Recent Labs Lab 11/30/15 0748 11/30/15 1612 11/30/15 2324 12/01/15 0726 12/01/15 1603  GLUCAP 113* 139* 142* 134* 132*    Microbiology: Results for orders placed or performed during the hospital encounter of Aug 24, 2015  Blood Culture (routine x 2)     Status: None   Collection Time: 08-24-15  8:51 AM  Result Value Ref Range Status   Specimen Description BLOOD [name]  Final   Special Requests BOTTLES DRAWN AEROBIC AND ANAEROBIC  3CC  Final   Culture NO GROWTH 5 DAYS  Final  Report Status 08/17/2015 FINAL  Final  Blood Culture (routine x 2)     Status: None   Collection Time: 07/28/2015  9:20 AM  Result Value Ref Range Status   Specimen Description BLOOD LEFT ARM  Final   Special Requests BOTTLES DRAWN AEROBIC AND ANAEROBIC  1CC  Final   Culture NO GROWTH 5 DAYS  Final   Report Status 08/17/2015 FINAL  Final  Wound culture     Status: None   Collection Time: 08/02/2015  9:20 AM  Result Value Ref Range Status   Specimen Description DECUBITIS  Final   Special Requests Normal  Final   Gram Stain   Final    FEW WBC SEEN MANY GRAM NEGATIVE RODS RARE GRAM POSITIVE COCCI    Culture    Final    HEAVY GROWTH ESCHERICHIA COLI MODERATE GROWTH ENTEROBACTER AEROGENES PROTEUS MIRABILIS HEAVY GROWTH ENTEROCOCCUS SPECIES VRE HAVE INTRINSIC RESISTANCE TO MOST COMMONLY USED ANTIBIOTICS AND THE ABILITY TO ACQUIRE RESISTANCE TO MOST AVAILABLE ANTIBIOTICS.    Report Status 08/16/2015 FINAL  Final   Organism ID, Bacteria ESCHERICHIA COLI  Final   Organism ID, Bacteria ENTEROBACTER AEROGENES  Final   Organism ID, Bacteria PROTEUS MIRABILIS  Final   Organism ID, Bacteria ENTEROCOCCUS SPECIES  Final      Susceptibility   Enterobacter aerogenes - MIC*    CEFTAZIDIME <=1 SENSITIVE Sensitive     CEFAZOLIN >=64 RESISTANT Resistant     CEFTRIAXONE <=1 SENSITIVE Sensitive     CIPROFLOXACIN <=0.25 SENSITIVE Sensitive     GENTAMICIN <=1 SENSITIVE Sensitive     IMIPENEM 1 SENSITIVE Sensitive     TRIMETH/SULFA <=20 SENSITIVE Sensitive     * MODERATE GROWTH ENTEROBACTER AEROGENES   Escherichia coli - MIC*    AMPICILLIN <=2 SENSITIVE Sensitive     CEFTAZIDIME <=1 SENSITIVE Sensitive     CEFAZOLIN <=4 SENSITIVE Sensitive     CEFTRIAXONE <=1 SENSITIVE Sensitive     CIPROFLOXACIN <=0.25 SENSITIVE Sensitive     GENTAMICIN <=1 SENSITIVE Sensitive     IMIPENEM <=0.25 SENSITIVE Sensitive     TRIMETH/SULFA <=20 SENSITIVE Sensitive     * HEAVY GROWTH ESCHERICHIA COLI   Proteus mirabilis - MIC*    AMPICILLIN >=32 RESISTANT Resistant     CEFTAZIDIME <=1 SENSITIVE Sensitive     CEFAZOLIN 8 SENSITIVE Sensitive     CEFTRIAXONE <=1 SENSITIVE Sensitive     CIPROFLOXACIN <=0.25 SENSITIVE Sensitive     GENTAMICIN <=1 SENSITIVE Sensitive     IMIPENEM 1 SENSITIVE Sensitive     TRIMETH/SULFA <=20 SENSITIVE Sensitive     * PROTEUS MIRABILIS   Enterococcus species - MIC*    AMPICILLIN >=32 RESISTANT Resistant     VANCOMYCIN >=32 RESISTANT Resistant     GENTAMICIN SYNERGY SENSITIVE Sensitive     TETRACYCLINE Value in next row Resistant      RESISTANT>=16    * HEAVY GROWTH ENTEROCOCCUS SPECIES  MRSA  PCR Screening     Status: None   Collection Time: 08/02/2015  2:38 PM  Result Value Ref Range Status   MRSA by PCR NEGATIVE NEGATIVE Final    Comment:        The GeneXpert MRSA Assay (FDA approved for NASAL specimens only), is one component of a comprehensive MRSA colonization surveillance program. It is not intended to diagnose MRSA infection nor to guide or monitor treatment for MRSA infections.   Blood culture (single)     Status: None   Collection Time: 08/02/2015  3:36  PM  Result Value Ref Range Status   Specimen Description BLOOD RIGHT ASSIST CONTROL  Final   Special Requests BOTTLES DRAWN AEROBIC AND ANAEROBIC  Final   Culture NO GROWTH 5 DAYS  Final   Report Status 08/17/2015 FINAL  Final  Wound culture     Status: None   Collection Time: 08/13/15 12:37 PM  Result Value Ref Range Status   Specimen Description WOUND  Final   Special Requests Normal  Final   Gram Stain   Final    FEW WBC SEEN TOO NUMEROUS TO COUNT GRAM NEGATIVE RODS FEW GRAM POSITIVE COCCI    Culture   Final    HEAVY GROWTH ESCHERICHIA COLI MODERATE GROWTH PROTEUS MIRABILIS LIGHT GROWTH KLEBSIELLA PNEUMONIAE MODERATE GROWTH ENTEROCOCCUS GALLINARUM CRITICAL RESULT CALLED TO, READ BACK BY AND VERIFIED WITH: [facility] BORBA AT 1042 08/16/15 DV    Report Status 08/17/2015 FINAL  Final   Organism ID, Bacteria ESCHERICHIA COLI  Final   Organism ID, Bacteria PROTEUS MIRABILIS  Final   Organism ID, Bacteria KLEBSIELLA PNEUMONIAE  Final   Organism ID, Bacteria ENTEROCOCCUS GALLINARUM  Final      Susceptibility   Escherichia coli - MIC*    AMPICILLIN >=32 RESISTANT Resistant     CEFTAZIDIME 4 RESISTANT Resistant     CEFAZOLIN >=64 RESISTANT Resistant     CEFTRIAXONE 16 RESISTANT Resistant     GENTAMICIN 2 SENSITIVE Sensitive     IMIPENEM >=16 RESISTANT Resistant     TRIMETH/SULFA <=20 SENSITIVE Sensitive     Extended ESBL POSITIVE Resistant     PIP/TAZO Value in next row Resistant      RESISTANT>=128     CIPROFLOXACIN Value in next row Sensitive      SENSITIVE<=0.25    * HEAVY GROWTH ESCHERICHIA COLI   Klebsiella pneumoniae - MIC*    AMPICILLIN Value in next row Resistant      SENSITIVE<=0.25    CEFTAZIDIME Value in next row Resistant      SENSITIVE<=0.25    CEFAZOLIN Value in next row Resistant      SENSITIVE<=0.25    CEFTRIAXONE Value in next row Resistant      SENSITIVE<=0.25    CIPROFLOXACIN Value in next row Resistant      SENSITIVE<=0.25    GENTAMICIN Value in next row Sensitive      SENSITIVE<=0.25    IMIPENEM Value in next row Resistant      SENSITIVE<=0.25    TRIMETH/SULFA Value in next row Resistant      SENSITIVE<=0.25    PIP/TAZO Value in next row Resistant      RESISTANT>=128    * LIGHT GROWTH KLEBSIELLA PNEUMONIAE   Proteus mirabilis - MIC*    AMPICILLIN Value in next row Resistant      RESISTANT>=128    CEFTAZIDIME Value in next row Sensitive      RESISTANT>=128    CEFAZOLIN Value in next row Sensitive      RESISTANT>=128    CEFTRIAXONE Value in next row Sensitive      RESISTANT>=128    CIPROFLOXACIN Value in next row Sensitive      RESISTANT>=128    GENTAMICIN Value in next row Sensitive      RESISTANT>=128    IMIPENEM Value in next row Sensitive      RESISTANT>=128    TRIMETH/SULFA Value in next row Sensitive      RESISTANT>=128    PIP/TAZO Value in next row Sensitive      SENSITIVE<=4    *  MODERATE GROWTH PROTEUS MIRABILIS   Enterococcus gallinarum - MIC*    AMPICILLIN Value in next row Resistant      SENSITIVE<=4    GENTAMICIN SYNERGY Value in next row Sensitive      SENSITIVE<=4    CIPROFLOXACIN Value in next row Resistant      RESISTANT>=8    TETRACYCLINE Value in next row Resistant      RESISTANT>=16    * MODERATE GROWTH ENTEROCOCCUS GALLINARUM  Wound culture     Status: None   Collection Time: 08/15/15  2:53 PM  Result Value Ref Range Status   Specimen Description WOUND  Final   Special Requests NONE  Final   Gram Stain FEW WBC  SEEN NO ORGANISMS SEEN   Final   Culture NO GROWTH 3 DAYS  Final   Report Status 08/18/2015 FINAL  Final  C difficile quick scan w PCR reflex     Status: None   Collection Time: 08/17/15 11:34 AM  Result Value Ref Range Status   C Diff antigen NEGATIVE NEGATIVE Final   C Diff toxin NEGATIVE NEGATIVE Final   C Diff interpretation Negative for C. difficile  Final  Stool culture     Status: None   Collection Time: 09/03/15  3:51 PM  Result Value Ref Range Status   Specimen Description STOOL  Final   Special Requests Immunocompromised  Final   Culture   Final    NO SALMONELLA OR SHIGELLA ISOLATED No Pathogenic E. coli detected NO CAMPYLOBACTER DETECTED    Report Status 09/07/2015 FINAL  Final  C difficile quick scan w PCR reflex     Status: None   Collection Time: 09/13/15 12:51 AM  Result Value Ref Range Status   C Diff antigen NEGATIVE NEGATIVE Final   C Diff toxin NEGATIVE NEGATIVE Final   C Diff interpretation Negative for C. difficile  Final  Culture, blood (routine x 2)     Status: None   Collection Time: 09/16/15 11:24 AM  Result Value Ref Range Status   Specimen Description BLOOD LEFT HAND  Final   Special Requests BOTTLES DRAWN AEROBIC AND ANAEROBIC  1CC  Final   Culture NO GROWTH 5 DAYS  Final   Report Status 09/21/2015 FINAL  Final  Culture, blood (routine x 2)     Status: None   Collection Time: 09/16/15 12:23 PM  Result Value Ref Range Status   Specimen Description BLOOD RIGHT HAND  Final   Special Requests BOTTLES DRAWN AEROBIC AND ANAEROBIC  1CC  Final   Culture  Setup Time   Final    GRAM POSITIVE COCCI AEROBIC BOTTLE ONLY CRITICAL RESULT CALLED TO, READ BACK BY AND VERIFIED WITH: TESS THOMAS,RN 09/17/2015 0631 BY JRS.    Culture   Final    ENTEROCOCCUS FAECALIS AEROBIC BOTTLE ONLY VRE HAVE INTRINSIC RESISTANCE TO MOST COMMONLY USED ANTIBIOTICS AND THE ABILITY TO ACQUIRE RESISTANCE TO MOST AVAILABLE ANTIBIOTICS. CRITICAL RESULT CALLED TO, READ BACK BY AND  VERIFIED WITH: CHERYL SMITH AT [id number] 09/19/15 DV    Report Status 09/21/2015 FINAL  Final   Organism ID, Bacteria ENTEROCOCCUS FAECALIS  Final      Susceptibility   Enterococcus faecalis - MIC*    AMPICILLIN <=2 SENSITIVE Sensitive     LINEZOLID 2 SENSITIVE Sensitive     CIPROFLOXACIN Value in next row Resistant      RESISTANT>=8    TETRACYCLINE Value in next row Resistant      RESISTANT>=16    VANCOMYCIN Value  in next row Resistant      RESISTANT>=32    GENTAMICIN SYNERGY Value in next row Resistant      RESISTANT>=32    * ENTEROCOCCUS FAECALIS  Culture, respiratory (NON-Expectorated)     Status: None   Collection Time: 09/16/15  3:50 PM  Result Value Ref Range Status   Specimen Description TRACHEAL ASPIRATE  Final   Special Requests Immunocompromised  Final   Gram Stain   Final    FEW WBC SEEN GOOD SPECIMEN - 80-90% WBCS RARE GRAM NEGATIVE RODS    Culture LIGHT GROWTH PSEUDOMONAS AERUGINOSA  Final   Report Status 09/19/2015 FINAL  Final   Organism ID, Bacteria PSEUDOMONAS AERUGINOSA  Final      Susceptibility   Pseudomonas aeruginosa - MIC*    CEFTAZIDIME 8 SENSITIVE Sensitive     CIPROFLOXACIN 2 INTERMEDIATE Intermediate     GENTAMICIN >=16 RESISTANT Resistant     IMIPENEM >=16 RESISTANT Resistant     PIP/TAZO Value in next row Sensitive      SENSITIVE32    CEFEPIME Value in next row Sensitive      SENSITIVE8    LEVOFLOXACIN Value in next row Resistant      RESISTANT>=8    * LIGHT GROWTH PSEUDOMONAS AERUGINOSA  Culture, expectorated sputum-assessment     Status: None   Collection Time: 09/22/15  2:09 PM  Result Value Ref Range Status   Specimen Description ENDOTRACHEAL  Final   Special Requests Normal  Final   Sputum evaluation THIS SPECIMEN IS ACCEPTABLE FOR SPUTUM CULTURE  Final   Report Status 09/24/2015 FINAL  Final  Culture, respiratory (NON-Expectorated)     Status: None   Collection Time: 09/22/15  2:09 PM  Result Value Ref Range Status   Specimen  Description ENDOTRACHEAL  Final   Special Requests Normal Reflexed from [id number]  Final   Gram Stain   Final    FEW WBC SEEN FEW GRAM NEGATIVE RODS POOR SPECIMEN - LESS THAN 70% WBCS    Culture   Final    MODERATE GROWTH PSEUDOMONAS AERUGINOSA LIGHT GROWTH KLEBSIELLA PNEUMONIAE REFER TO SENSITIVITIES FROM PREVIOUS CULTURE FOR ORG 2    Report Status 10/01/2015 FINAL  Final   Organism ID, Bacteria PSEUDOMONAS AERUGINOSA  Final   Organism ID, Bacteria KLEBSIELLA PNEUMONIAE  Final      Susceptibility   Pseudomonas aeruginosa - MIC*    CEFTAZIDIME 4 SENSITIVE Sensitive     CIPROFLOXACIN 2 INTERMEDIATE Intermediate     GENTAMICIN >=16 RESISTANT Resistant     IMIPENEM >=16 RESISTANT Resistant     PIP/TAZO Value in next row Sensitive      SENSITIVE16    * MODERATE GROWTH PSEUDOMONAS AERUGINOSA  Tissue culture     Status: None   Collection Time: 09/24/15  6:44 AM  Result Value Ref Range Status   Specimen Description BONE  Final   Special Requests Normal  Final   Gram Stain MODERATE WBC SEEN FEW GRAM NEGATIVE RODS   Final   Culture   Final    MODERATE GROWTH ESCHERICHIA COLI MODERATE GROWTH KLEBSIELLA PNEUMONIAE ESBL-EXTENDED SPECTRUM BETA LACTAMASE-THE ORGANISM IS RESISTANT TO PENICILLINS, CEPHALOSPORINS AND AZTREONAM ACCORDING TO CLSI [id number] VOL.25 N01 JAN 2005. ORGANISM 1 This organism isolate is resistant to one or more antiotic agents in three or more antimicrobial categories.  Suggest Infectious Disease consult.   ORGANISM 2 CRITICAL RESULT CALLED TO, READ BACK BY AND VERIFIED WITH: RN Mardene Celeste LINDSAY 09/27/15 1005AM    Report Status 09/28/2015  FINAL  Final   Organism ID, Bacteria ESCHERICHIA COLI  Final   Organism ID, Bacteria KLEBSIELLA PNEUMONIAE  Final      Susceptibility   Escherichia coli - MIC*    AMPICILLIN >=32 RESISTANT Resistant     CEFTAZIDIME 4 RESISTANT Resistant     CEFAZOLIN >=64 RESISTANT Resistant     CEFTRIAXONE 8 RESISTANT Resistant     GENTAMICIN <=1  SENSITIVE Sensitive     IMIPENEM 8 RESISTANT Resistant     TRIMETH/SULFA <=20 SENSITIVE Sensitive     Extended ESBL POSITIVE Resistant     PIP/TAZO Value in next row Resistant      RESISTANT>=128    * MODERATE GROWTH ESCHERICHIA COLI   Klebsiella pneumoniae - MIC*    AMPICILLIN Value in next row Resistant      RESISTANT>=128    CEFTAZIDIME Value in next row Resistant      RESISTANT>=128    CEFAZOLIN Value in next row Resistant      RESISTANT>=128    CEFTRIAXONE Value in next row Resistant      RESISTANT>=128    CIPROFLOXACIN Value in next row Resistant      RESISTANT>=128    GENTAMICIN Value in next row Resistant      RESISTANT>=128    IMIPENEM Value in next row Resistant      RESISTANT>=128    TRIMETH/SULFA Value in next row Sensitive      RESISTANT>=128    PIP/TAZO Value in next row Resistant      RESISTANT>=128    * MODERATE GROWTH KLEBSIELLA PNEUMONIAE  Anaerobic culture     Status: None   Collection Time: 09/24/15  3:55 PM  Result Value Ref Range Status   Specimen Description BONE  Final   Special Requests Normal  Final   Culture NO ANAEROBES ISOLATED  Final   Report Status 09/29/2015 FINAL  Final  Culture, fungus without smear     Status: None   Collection Time: 09/24/15  3:55 PM  Result Value Ref Range Status   Specimen Description BONE  Final   Special Requests Normal  Final   Culture NO FUNGUS ISOLATED AFTER 24 DAYS  Final   Report Status 10/18/2015 FINAL  Final  C difficile quick scan w PCR reflex     Status: None   Collection Time: 10/07/15  2:02 PM  Result Value Ref Range Status   C Diff antigen NEGATIVE NEGATIVE Final   C Diff toxin NEGATIVE NEGATIVE Final   C Diff interpretation Negative for C. difficile  Final  Culture, blood (routine x 2)     Status: None   Collection Time: 10/16/15  9:52 AM  Result Value Ref Range Status   Specimen Description BLOOD LEFT HAND  Final   Special Requests   Final    BOTTLES DRAWN AEROBIC AND ANAEROBIC  AER 6CC ANA 4CC    Culture NO GROWTH 6 DAYS  Final   Report Status 10/22/2015 FINAL  Final  Culture, blood (routine x 2)     Status: None   Collection Time: 10/16/15 12:25 PM  Result Value Ref Range Status   Specimen Description BLOOD LEFT HAND  Final   Special Requests BOTTLES DRAWN AEROBIC AND ANAEROBIC  5CC  Final   Culture NO GROWTH 6 DAYS  Final   Report Status 10/22/2015 FINAL  Final  Culture, expectorated sputum-assessment     Status: None   Collection Time: 10/18/15  1:15 PM  Result Value Ref Range Status   Specimen  Description SPUTUM  Final   Special Requests NONE  Final   Sputum evaluation THIS SPECIMEN IS ACCEPTABLE FOR SPUTUM CULTURE  Final   Report Status 10/18/2015 FINAL  Final  Culture, respiratory (NON-Expectorated)     Status: None   Collection Time: 10/18/15  1:15 PM  Result Value Ref Range Status   Specimen Description SPUTUM  Final   Special Requests NONE Reflexed from [id number]  Final   Gram Stain   Final    GOOD SPECIMEN - 80-90% WBCS MODERATE WBC SEEN MANY GRAM NEGATIVE RODS    Culture   Final    HEAVY GROWTH PSEUDOMONAS AERUGINOSA MODERATE GROWTH KLEBSIELLA PNEUMONIAE CRITICAL RESULT CALLED TO, READ BACK BY AND VERIFIED WITHRod Mae AT 5409 10/21/15 DV KLEBSIELLA PNEUMONIAE This organism isolate is resistant to one or more antiotic agents in three or more antimicrobial categories.  Suggest Infectious Disease  consult.      Report Status 10/22/2015 FINAL  Final   Organism ID, Bacteria PSEUDOMONAS AERUGINOSA  Final   Organism ID, Bacteria KLEBSIELLA PNEUMONIAE  Final      Susceptibility   Klebsiella pneumoniae - MIC*    AMPICILLIN >=32 RESISTANT Resistant     CEFAZOLIN >=64 RESISTANT Resistant     CEFTRIAXONE >=64 RESISTANT Resistant     CIPROFLOXACIN >=4 RESISTANT Resistant     GENTAMICIN >=16 RESISTANT Resistant     IMIPENEM >=16 RESISTANT Resistant     NITROFURANTOIN >=512 RESISTANT Resistant     TRIMETH/SULFA <=20 SENSITIVE Sensitive     * MODERATE GROWTH  KLEBSIELLA PNEUMONIAE   Pseudomonas aeruginosa - MIC*    CEFTAZIDIME 4 SENSITIVE Sensitive     CIPROFLOXACIN >=4 RESISTANT Resistant     GENTAMICIN 8 INTERMEDIATE Intermediate     IMIPENEM >=16 RESISTANT Resistant     PIP/TAZO Value in next row Sensitive      SENSITIVE32    * HEAVY GROWTH PSEUDOMONAS AERUGINOSA  C difficile quick scan w PCR reflex     Status: None   Collection Time: 10/18/15  4:39 PM  Result Value Ref Range Status   C Diff antigen NEGATIVE NEGATIVE Final   C Diff toxin NEGATIVE NEGATIVE Final   C Diff interpretation Negative for C. difficile  Final  Culture, blood (Routine X 2) w Reflex to ID Panel     Status: None   Collection Time: 11/06/15  8:27 AM  Result Value Ref Range Status   Specimen Description BLOOD RIGHT HAND  Final   Special Requests BOTTLES DRAWN AEROBIC AND ANAEROBIC 3CC  Final   Culture NO GROWTH 5 DAYS  Final   Report Status 11/11/2015 FINAL  Final  Culture, blood (Routine X 2) w Reflex to ID Panel     Status: None   Collection Time: 11/06/15  8:29 AM  Result Value Ref Range Status   Specimen Description BLOOD LEFT HAND  Final   Special Requests BOTTLES DRAWN AEROBIC AND ANAEROBIC  1CC  Final   Culture NO GROWTH 5 DAYS  Final   Report Status 11/11/2015 FINAL  Final  Culture, expectorated sputum-assessment     Status: None   Collection Time: 11/06/15  8:41 AM  Result Value Ref Range Status   Specimen Description EXPECTORATED SPUTUM  Final   Special Requests Immunocompromised  Final   Sputum evaluation THIS SPECIMEN IS ACCEPTABLE FOR SPUTUM CULTURE  Final   Report Status 11/06/2015 FINAL  Final  Culture, respiratory (NON-Expectorated)     Status: None   Collection Time: 11/06/15  8:41  AM  Result Value Ref Range Status   Specimen Description EXPECTORATED SPUTUM  Final   Special Requests Immunocompromised Reflexed from [id number]  Final   Gram Stain   Final    MANY WBC SEEN MANY GRAM NEGATIVE RODS EXCELLENT SPECIMEN - 90-100% WBCS    Culture    Final    MODERATE GROWTH KLEBSIELLA PNEUMONIAE MODERATE GROWTH PSEUDOMONAS AERUGINOSA CRITICAL RESULT CALLED TO, READ BACK BY AND VERIFIED WITH: DR. FITZGERALD AT 1120 11/09/15 DV DR. FITZGERALD REQUESTED ORGANISM BE SENT OUT FOR TIGECYCLINE CEFT/AVIBACTAM AND COLISTIN Sent to [facility] for further susceptibility testing. 11/11/15 CTJ    Report Status 11/15/2015 FINAL  Final   Organism ID, Bacteria KLEBSIELLA PNEUMONIAE  Final   Organism ID, Bacteria PSEUDOMONAS AERUGINOSA  Final      Susceptibility   Klebsiella pneumoniae - MIC*    AMPICILLIN >=32 RESISTANT Resistant     CEFAZOLIN >=64 RESISTANT Resistant     CEFTRIAXONE 32 RESISTANT Resistant     CIPROFLOXACIN >=4 RESISTANT Resistant     GENTAMICIN >=16 RESISTANT Resistant     IMIPENEM >=16 RESISTANT Resistant     NITROFURANTOIN 256 RESISTANT Resistant     TRIMETH/SULFA >=320 RESISTANT Resistant     Extended ESBL POSITIVE Resistant     * MODERATE GROWTH KLEBSIELLA PNEUMONIAE   Pseudomonas aeruginosa - MIC*    CEFTAZIDIME >=64 RESISTANT Resistant     CIPROFLOXACIN 2 INTERMEDIATE Intermediate     GENTAMICIN >=16 RESISTANT Resistant     IMIPENEM >=16 RESISTANT Resistant     * MODERATE GROWTH PSEUDOMONAS AERUGINOSA  Culture, respiratory (NON-Expectorated)     Status: None   Collection Time: 11/14/15  2:07 PM  Result Value Ref Range Status   Specimen Description TRACHEAL ASPIRATE  Final   Special Requests NONE  Final   Gram Stain   Final    EXCELLENT SPECIMEN - 90-100% WBCS MANY WBC SEEN MODERATE GRAM NEGATIVE RODS    Culture   Final    HEAVY GROWTH PSEUDOMONAS AERUGINOSA CRITICAL VALUE NOTED.  VALUE IS CONSISTENT WITH PREVIOUSLY REPORTED AND CALLED VALUE.    Report Status 11/16/2015 FINAL  Final   Organism ID, Bacteria PSEUDOMONAS AERUGINOSA  Final      Susceptibility   Pseudomonas aeruginosa - MIC*    CEFTAZIDIME >=64 RESISTANT Resistant     CIPROFLOXACIN 2 INTERMEDIATE Intermediate     GENTAMICIN >=16 RESISTANT Resistant      IMIPENEM >=16 RESISTANT Resistant     PIP/TAZO Value in next row Resistant      RESISTANT>=128    TOBRAMYCIN Value in next row Sensitive      SENSITIVE2    * HEAVY GROWTH PSEUDOMONAS AERUGINOSA    Coagulation Studies: No results for input(s): LABPROT, INR in the last 72 hours.  Urinalysis: No results for input(s): COLORURINE, LABSPEC, PHURINE, GLUCOSEU, HGBUR, BILIRUBINUR, KETONESUR, PROTEINUR, UROBILINOGEN, NITRITE, LEUKOCYTESUR in the last 72 hours.  Invalid input(s): APPERANCEUR    Imaging: No results found.   Medications:   . dextrose 25 mL/hr at 12/01/15 0840  . feeding supplement (VITAL AF 1.2 CAL) 1,000 mL (12/01/15 1601)  . norepinephrine (LEVOPHED) Adult infusion 3 mcg/min (12/01/15 0700)   . albuterol  2.5 mg Nebulization Q6H  . amiodarone  100 mg Per Tube Daily  . antiseptic oral rinse  7 mL Mouth Rinse BID  . antiseptic oral rinse  7 mL Mouth Rinse QID  . aspirin  81 mg Per Tube Daily  . chlorhexidine gluconate  15 mL Mouth Rinse BID  .  colistimethate (COLISTIN) IV  250 mg Intravenous Q48H  . epoetin (EPOGEN/PROCRIT) injection  10,000 Units Intravenous Q M,W,F-HD  . escitalopram  10 mg Per Tube Daily  . feeding supplement (PRO-STAT SUGAR FREE 64)  30 mL Per Tube 6 X Daily  . free water  20 mL Per Tube 6 times per day  . heparin subcutaneous  5,000 Units Subcutaneous Q12H  . hydrocortisone  10 mg Per Tube BID  . lidocaine  1 patch Transdermal Q24H  . midodrine  10 mg Per Tube TID WC  . mirtazapine  7.5 mg Per Tube QHS  . multivitamin  5 mL Per Tube Daily  . pantoprazole sodium  40 mg Per Tube Daily  . sodium chloride  10-40 mL Intracatheter Q12H  . sodium hypochlorite   Irrigation BID  . tigecycline (TYGACIL) IVPB  50 mg Intravenous Q12H  . tobramycin (PF)  300 mg Nebulization BID   acetaminophen (TYLENOL) oral liquid 160 mg/5 mL, albuterol, HYDROmorphone (DILAUDID) injection, loperamide, ondansetron (ZOFRAN) IV, oxyCODONE, polyvinyl alcohol, sodium  chloride, zinc oxide  Assessment/ Plan:  50 y.o. 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 Admitted on 08/19/2015  Echo: 11/07/19: EF of 30% to 35%.   1. End-stage renal disease on hemodialysis on HD MWF. The patient has been on dialysis since October of 2014. R IJ permcath.  - Dialysis treatment with IV albumin for oncotic support. Slow BFR   - Midodrine and hydrocortisone ordered for blood pressure support.  - Continue MWF schedule, monitor daily for dialysis treatment.  - Overall prognosis is extremely poor - In my opinion she will not be able to get well enough for an outpatient dialysis placement  2. Anemia of CKD. Hemoglobin 8.9. Multiple transfusions during admission   - Epogen and periodic blood transfusions. Not a candidate for IV iron due to concurrent infections  3. Acute resp failure: ventilator dependent -prolonged course on ventilator, fio2 currently 40% with PEEP of 5.    4. Anasarca - UF as with HD as tolerated  5. Sepsis: pseudomonas in tracheal aspirate - tobramycin inhaled  - tigecycline, erythromycin    6. Secondary Hyperparathyroidism: PTH 50, phos 6.1 - not currently on a binder or vitamin D agent.        LOS: 111 Courtney [name] 1/12/20174:22 PM

## 2015-12-02 LAB — GLUCOSE, CAPILLARY
GLUCOSE-CAPILLARY: 117 mg/dL — AB (ref 65–99)
GLUCOSE-CAPILLARY: 172 mg/dL — AB (ref 65–99)
Glucose-Capillary: 134 mg/dL — ABNORMAL HIGH (ref 65–99)

## 2015-12-02 LAB — RENAL FUNCTION PANEL
ALBUMIN: 1.6 g/dL — AB (ref 3.5–5.0)
Anion gap: 7 (ref 5–15)
BUN: 95 mg/dL — AB (ref 6–20)
CALCIUM: 8.2 mg/dL — AB (ref 8.9–10.3)
CO2: 29 mmol/L (ref 22–32)
CREATININE: 1.34 mg/dL — AB (ref 0.44–1.00)
Chloride: 96 mmol/L — ABNORMAL LOW (ref 101–111)
GFR calc Af Amer: 53 mL/min — ABNORMAL LOW (ref >60)
GFR, EST NON AFRICAN AMERICAN: 45 mL/min — AB (ref >60)
GLUCOSE: 186 mg/dL — AB (ref 65–99)
PHOSPHORUS: 5.6 mg/dL — AB (ref 2.5–4.6)
POTASSIUM: 3.7 mmol/L (ref 3.5–5.1)
SODIUM: 132 mmol/L — AB (ref 135–145)

## 2015-12-02 LAB — CBC
HCT: 30.4 % — ABNORMAL LOW (ref 35.0–47.0)
Hemoglobin: 8.8 g/dL — ABNORMAL LOW (ref 12.0–16.0)
MCH: 28.7 pg (ref 26.0–34.0)
MCHC: 29.1 g/dL — AB (ref 32.0–36.0)
MCV: 98.8 fL (ref 80.0–100.0)
PLATELETS: 58 10*3/uL — AB (ref 150–440)
RBC: 3.08 MIL/uL — ABNORMAL LOW (ref 3.80–5.20)
RDW: 21.4 % — AB (ref 11.5–14.5)
WBC: 13.5 10*3/uL — AB (ref 3.6–11.0)

## 2015-12-02 MED ORDER — ALBUMIN HUMAN 25 % IV SOLN
12.5000 g | Freq: Once | INTRAVENOUS | Status: AC
  Administered 2015-12-02: 12.5 g via INTRAVENOUS
  Filled 2015-12-02: qty 50

## 2015-12-02 NOTE — Progress Notes (Signed)
ANTIBIOTIC CONSULT NOTE - follow Up  Pharmacy Consult for Colistin and Tigecycline  Indication: MDR Respiratory Infection   Allergies  Allergen Reactions  . Contrast Media [Iodinated Diagnostic Agents] Anaphylaxis  . Ampicillin Rash   Patient Measurements: Height:  (SCALE BROKE) Weight:  (No bedscale) IBW/kg (Calculated) : 66.2  Vital Signs: Temp: 97.4 F (36.3 C) (01/13 0800) Temp Source: Oral (01/13 0800) BP: 123/56 mmHg (01/13 1415) Pulse Rate: 69 (01/13 1415) Intake/Output from previous day: 01/12 0701 - 01/13 0700 In: 1724 [I.V.:704.5; NG/GT:819.5; IV Piggyback:200] Out: -  Intake/Output from this shift: Total I/O In: 615.2 [I.V.:195.2; NG/GT:420] Out: -   Recent Labs  11/30/15 1633  WBC 13.3*  HGB 8.9*  PLT 68*  CREATININE 1.38*   Estimated Creatinine Clearance: 62 mL/min (by C-G formula based on Cr of 1.38). No results for input(s): VANCOTROUGH, VANCOPEAK, VANCORANDOM, GENTTROUGH, GENTPEAK, GENTRANDOM, TOBRATROUGH, TOBRAPEAK, TOBRARND, AMIKACINPEAK, AMIKACINTROU, AMIKACIN in the last 72 hours.   Microbiology: Recent Results (from the past 720 hour(s))  Culture, blood (Routine X 2) w Reflex to ID Panel     Status: None   Collection Time: 11/06/15  8:27 AM  Result Value Ref Range Status   Specimen Description BLOOD RIGHT HAND  Final   Special Requests BOTTLES DRAWN AEROBIC AND ANAEROBIC 3CC  Final   Culture NO GROWTH 5 DAYS  Final   Report Status 11/11/2015 FINAL  Final  Culture, blood (Routine X 2) w Reflex to ID Panel     Status: None   Collection Time: 11/06/15  8:29 AM  Result Value Ref Range Status   Specimen Description BLOOD LEFT HAND  Final   Special Requests BOTTLES DRAWN AEROBIC AND ANAEROBIC  1CC  Final   Culture NO GROWTH 5 DAYS  Final   Report Status 11/11/2015 FINAL  Final  Culture, expectorated sputum-assessment     Status: None   Collection Time: 11/06/15  8:41 AM  Result Value Ref Range Status   Specimen Description EXPECTORATED SPUTUM   Final   Special Requests Immunocompromised  Final   Sputum evaluation THIS SPECIMEN IS ACCEPTABLE FOR SPUTUM CULTURE  Final   Report Status 11/06/2015 FINAL  Final  Culture, respiratory (NON-Expectorated)     Status: None   Collection Time: 11/06/15  8:41 AM  Result Value Ref Range Status   Specimen Description EXPECTORATED SPUTUM  Final   Special Requests Immunocompromised Reflexed from [id number]  Final   Gram Stain   Final    MANY WBC SEEN MANY GRAM NEGATIVE RODS EXCELLENT SPECIMEN - 90-100% WBCS    Culture   Final    MODERATE GROWTH KLEBSIELLA PNEUMONIAE MODERATE GROWTH PSEUDOMONAS AERUGINOSA CRITICAL RESULT CALLED TO, READ BACK BY AND VERIFIED WITH: DR. FITZGERALD AT 1120 11/09/15 DV DR. FITZGERALD REQUESTED ORGANISM BE SENT OUT FOR TIGECYCLINE CEFT/AVIBACTAM AND COLISTIN Sent to [facility] for further susceptibility testing. 11/11/15 CTJ    Report Status 11/15/2015 FINAL  Final   Organism ID, Bacteria KLEBSIELLA PNEUMONIAE  Final   Organism ID, Bacteria PSEUDOMONAS AERUGINOSA  Final      Susceptibility   Klebsiella pneumoniae - MIC*    AMPICILLIN >=32 RESISTANT Resistant     CEFAZOLIN >=64 RESISTANT Resistant     CEFTRIAXONE 32 RESISTANT Resistant     CIPROFLOXACIN >=4 RESISTANT Resistant     GENTAMICIN >=16 RESISTANT Resistant     IMIPENEM >=16 RESISTANT Resistant     NITROFURANTOIN 256 RESISTANT Resistant     TRIMETH/SULFA >=320 RESISTANT Resistant     Extended ESBL POSITIVE  Resistant     * MODERATE GROWTH KLEBSIELLA PNEUMONIAE   Pseudomonas aeruginosa - MIC*    CEFTAZIDIME >=64 RESISTANT Resistant     CIPROFLOXACIN 2 INTERMEDIATE Intermediate     GENTAMICIN >=16 RESISTANT Resistant     IMIPENEM >=16 RESISTANT Resistant     * MODERATE GROWTH PSEUDOMONAS AERUGINOSA  Culture, respiratory (NON-Expectorated)     Status: None   Collection Time: 11/14/15  2:07 PM  Result Value Ref Range Status   Specimen Description TRACHEAL ASPIRATE  Final   Special Requests NONE  Final    Gram Stain   Final    EXCELLENT SPECIMEN - 90-100% WBCS MANY WBC SEEN MODERATE GRAM NEGATIVE RODS    Culture   Final    HEAVY GROWTH PSEUDOMONAS AERUGINOSA CRITICAL VALUE NOTED.  VALUE IS CONSISTENT WITH PREVIOUSLY REPORTED AND CALLED VALUE.    Report Status 11/16/2015 FINAL  Final   Organism ID, Bacteria PSEUDOMONAS AERUGINOSA  Final      Susceptibility   Pseudomonas aeruginosa - MIC*    CEFTAZIDIME >=64 RESISTANT Resistant     CIPROFLOXACIN 2 INTERMEDIATE Intermediate     GENTAMICIN >=16 RESISTANT Resistant     IMIPENEM >=16 RESISTANT Resistant     PIP/TAZO Value in next row Resistant      RESISTANT>=128    TOBRAMYCIN Value in next row Sensitive      SENSITIVE2    * HEAVY GROWTH PSEUDOMONAS AERUGINOSA    Medical History: Past Medical History  Diagnosis Date  . Obesity   . Dyslipidemia   . Hypertension   . Coronary artery disease     s/p BMS 2010 LAD  . Dysrhythmia     ventricular tachycardia resolved after LAD stent and beta blocker  . Diabetes mellitus     with retinopathy, neuropathy and microalbuminemia  . 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 Patient is on day 113 of hospital stay, and has been treated for multiple infections during this time.   Plan:  Per microbiology, Klebsiella is sensitive to Tygacil. Sensitivity results for Avycaz and colistin are still pending.   Continue tigecycline 50mg  IV q12h.   Continue  colistimethate 250mg  (~2.5 mg/kg) IV q48 hours.   Patient should be monitored for nephrotoxicity and neurotoxicity.  Pharmacy to continue to follow per consult.      Charlies Silvers, PharmD 12/02/2015 3:30 PM

## 2015-12-02 NOTE — Care Management (Signed)
Patient now on pressors, and ventilator.  Failed spontaneous breathing trial.   Respirations described as agonal.  Husband is aware and patient remains a full code

## 2015-12-02 NOTE — Progress Notes (Signed)
PULMONARY / CRITICAL CARE MEDICINE   Name: Courtney [name] MRN: [id number] DOB: 05/14/1966    ADMISSION DATE:  09-04-2015  BRIEF HISTORY: 49 AAF who has been in medical facilities (hosp, LTAC, rehab) for 2 yrs following gastric bypass surgery with multiple complications. Now with chronic trach, ESRD, profound debilitation, severe sacral pressure ulcer. Was seemingly making progress and transferred to rehab facility approx one week prior to this admission. She was sent to [facility] ED with AMS and hypotension. Working dx of severe sepsis/septic shock due to infected sacral pressure ulcer. Since admission. Her course is been very complicated with numerous complications including septic shock and GI bleeding. Now with failure to wean from vent and possible MI event, repeat ECHO EF 30-35, possible old MI, severe hypokinesis of the anterior and anteroseptal myocardium   SUBJ: Patient with acute shock, hypoxia Remains on vent-unable to wean Placed on vasopressors, guppy breathing Will likley code today-husband notified and updated   Summary of MAJOR EVENTS/TEST RESULTS: Admission 02/07/14-05/07/14 Admission 07/21/14-09/06/14 Discharged to [facility]. Pt had palliative consult at that time, were asked to sign off by husband.  09/23 CT head: NAD 09/23 EEG: no epileptiform activity 09/23 PRBCs for Hgb 6.4 09/24 bedside debridement of sacral wound. Abscess drained 09/25 Off vasopressors. More alert. No distress. Worsening thrombocytopenia. Vanc DC'd 09/29 Dr. Sampson Goon (I.D) excused from the case by patient's husband. 10/02 MRI -multiple infarcts 10/03 tracheal bleeding- transfused platelets 10/03 hospitalist service excused from the case by patient's husband 10/03 Echocardiogram ejection fraction was 55-60%, pulmonary systolic pressure was 39 mmHg 40/98 restart TF's at lower rate, attempt reg HD  10/12 Transferred to med-surg floor. Remains on PCCM service 10/14 SLP eval: pt unable to tolerate  PMV adequately 10/17-will re-attempt PM valve-discussed with Speech therapist 10/18 passed swallow eval-start pureed thick foods no thin liquids-continue NG feeds 10/19 transferred to step down for sepsis/aspiration pneumonia 10/19 cxr shows RLL opacity 10/21 sacral decub debride at bedside by surgery 10/22 started back on vasopressors while on HD 10/27 CT with osteo, R hip fx, unable to identify tip of dubhoff tube - sent for fluoro study 10/28 Ortho consultation: I do not feel that she is a surgical candidate. Therefore, I feel that it would best to manage this fracture nonsurgically and allow it to heal by itself over time, which it should.  10/28 Gen Surg consultation: Due to the lack of free air or free flow of contrast and the peritoneum there is no indication for any surgical intervention on this. Would recommend pulling back the feeding tube 1-2 cm. Would recheck an abdominal film to confirm no pneumoperitoneum in the morning. Absence of any changes okay to continue using Dobbhoff for feeding and medications. 10/28 gastrograffin study: The study confirms that the feeding tube pes perforated through the duodenum and the tip is within a cavity that fills with injected contrast. The cavity does appear walled off 11/4 refuses oral feeds, continue TF's CT reviewed: T8-T9 discitis/osteomyelitis, RT HIP FRACTURE 11/5 placed back on vasopressors, placed back on Vent due to resp acidosis, levophed turned off 11/6 afternoon 11/5 PM Trach changed out due to cuff leak, #6 Shiley cuffed, 11/6 dubhoff occluded, removed and replaced, new dubhoff shows tube in the antral stomach 11/15 refusing to take oral feeds 11/18 placed back on Vent for increased WOB,SOB. 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 10/18/2015; the patient developed a new left basal pneumonia; with  recurrent sepsis.  10/19/2015; fevers resolved with IV  acetaminophen. Tube feeds changed from vital to Jevity.  12/7 CXR with stable b/l opacities.  12/12 elevated K s/p HD-remains on AC mode 12/16-vomiting-hold feeds and re-assess in next 24 hrs 12/17- TF restarted at 1/2 goal (goal = 50cc/per) 12/18- elevated wbc, fever (101.4), recultured, restarted on ceftaz 12/19 Pericardial friction rub. Echocardiogram: LVEF 30-35%, anteroseptal hypokinesis or akinesis c/w anterior wall MI 12/20 Dr Sung Amabile discussed new echocardiogram findings with pt's husband and explained that this likely represents an anterior wall MI sometime in the previous couple of weeks. It was explained that she is not a candidate for any cardiology intervention. Code status was again discussed. Husband expressed understanding that she was not going to survive this illness in any favorable way but continues to request that she be full code status based on what he believes to be her wishes 12/21 vomitied 50cc of TF - AM TF held, restart TF at 3pm @30cc  12/26 Persistent intermittent trach cuff leak. Distal XLT trach requested.  12/27 15 beat NSVT 12/28 patient refused HD and refused Bath time 12/29 D10 infusion for hypoglycemia, TF's on hold due to intolerance 12/30 CODE BLUE  VTACH 01/01 transfuse 2 units of blood 01/02 increased respiratory distress, copious purulent secretions 01/02 LE venous US: No DVT 01/03 Repeat TTE: The cavity size was mildly dilated. Systolic function was moderately to severely reduced. The estimated ejection fraction was in the range of 30% to 35%. Diffuse hypokinesis. Moderate to severe hypokinesis of the anteroseptal, anterior, and apical myocardium. Hypokinesis of the anterior myocardium 01/05 Hypothermic - Bair Hugger ordered 01/10-placed back on vasopressors for septic shock 1/11-failed SBT due to resp muscle fatigue 1/13-worsening shock and hypoxia, agonal respirations  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 Pneumoniae, P. Mirabilis, VRE Wound 9/26 >> No growth Blood 9/23 >> NEG CDiff 9/27>>neg Stool Cx 10/15>> negative Cdiff 10/25>>neg Trach Aspirate 10/28>> light growth pseudomonas Blood 10/28 >> 1/2 GPC >>  Sputum cultures obtained 09/22/15 due to mucus plugging>>Pseudomonas BONE TISSUE Cx 11/3>>E.coli (ESBL), k. Pneumonia (daptomycin and septra) Bld Cx 11/27>>negative thus far.  Trach Asp 11/27>> grew out Pseudomonas, and Klebsiella pneumonia. Trach Asp 12/18>> MDR Klebsiella, Pseudomonas Bld Cx 12/18>> NEG 12/26 resp culture >> MDR pseudomonas  ANTIMICROBIALS:  Aztreonam 9/23 >> 9/24 Vanc 9/23 >> 9/25 Vanc 9/26>>9/27 Daptomycin 9/27>> 10/12, 10/28>> off Meropenem 9/23 >> 10/14, 10/28 >> 10/31 Zosyn 11/27,>> 11/30. Levaquin 11/29>> 11/29. Ceftaz 12/1>>12/11, 12/18>> 12/24 Colisitin 12/26 >>  Tigecycline 12/26 >>  Nebulized tobramycin 12/30 >>  VITAL SIGNS: Temp:  [96.6 F (35.9 C)-98.2 F (36.8 C)] 97.4 F (36.3 C) (01/13 0800) Pulse Rate:  [34-72] 54 (01/13 1000) Resp:  [9-31] 17 (01/13 1000) BP: (77-135)/(39-95) 109/65 mmHg (01/13 1000) SpO2:  [94 %-100 %] 97 % (01/13 1000) FiO2 (%):  [28 %] 28 % (01/13 0929) HEMODYNAMICS:   VENTILATOR SETTINGS: Vent Mode:  [-] PRVC FiO2 (%):  [28 %] 28 % Set Rate:  [14 bmp] 14 bmp Vt Set:  [500 mL] 500 mL PEEP:  [5 cmH20] 5 cmH20 Plateau Pressure:  [15 cmH20-24 cmH20] 15 cmH20 INTAKE / OUTPUT:  Intake/Output Summary (Last 24  hours) at 12/02/15 1136 Last data filed at 12/02/15 1000  Gross per 24 hour  Intake 1713.48 ml  Output      0 ml  Net 1713.48 ml    Review of Systems  Unable to perform  ROS  Physical Exam  RASS 0, intermittent trach tube cuff leak,vent graphics look good, no air leak  HEENT; Cushingoid facies, temporal wasting, NGT present. Tube feeds are infusing. Trach site clean Diffuse rhonchi Reg with occ extrasystoles, no M Abdomen soft, + BS Ext cool, trace symmetric edema, marked muscle wasting Severe sacral decubitus ulcer- to the spine  LABS:  CBC  Recent Labs Lab 11/28/15 0458 11/28/15 1348 11/30/15 1633  WBC 15.6* 19.6* 13.3*  HGB 9.1* 9.0* 8.9*  HCT 29.8* 29.6* 29.7*  PLT 94* 81* 68*   Coag's No results for input(s): APTT, INR in the last 168 hours. BMET  Recent Labs Lab 11/28/15 1348 11/30/15 1633  NA 129* 129*  K 3.8 3.9  CL 95* 97*  CO2 26 27  BUN 102* 95*  CREATININE 1.45* 1.38*  GLUCOSE 109* 140*   Electrolytes  Recent Labs Lab 11/28/15 1348 11/30/15 1633  CALCIUM 8.0* 8.0*  PHOS 6.1*  --    Sepsis Markers No results for input(s): LATICACIDVEN, PROCALCITON, O2SATVEN in the last 168 hours. ABG No results for input(s): PHART, PCO2ART, PO2ART in the last 168 hours. Liver Enzymes  Recent Labs Lab 11/28/15 1348  ALBUMIN 1.6*   Cardiac Enzymes No results for input(s): TROPONINI, PROBNP in the last 168 hours. Glucose  Recent Labs Lab 11/30/15 1612 11/30/15 2324 12/01/15 0726 12/01/15 1603 12/01/15 2334 12/02/15 0729  GLUCAP 139* 142* 134* 132* 150* 134*    Imaging No results found.  ASSESSMENT / PLAN: IMPRESSION: Very prolonged and complicated hospitalization after gastric bypass surgery 2014 Never returned home ESRD-hd as tolerated Recurrent severe sepsis - PAN resistant Klebisella in sputum Recurrent PNA-now with pseudomonas H/O C diff H/O VTE DM2 - controlled Episodic hypoglycemia Chronic steroid therapy, for a history of of adrenal insufficiency Incidental finding of R hip fx - conservative mgmt. 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 Husband declined TEE Profound deconditioning Chronic pain New finding of pericardial friction rub 12/19 - resolved New finding of cardiomyopathy, likely post MI 12/19  PAF/flutter Sinus bradycardia HCAP with MDR Donalee CitrinKlebs and pseudomonas Chronic anemia Severe protein - calorie malnutrition TF intolerance Mild thrombocytopenia 01/06  Cont vent support - settings reviewed and/or adjusted Cont vent bundle Cont HD per renal Monitor BMET intermittently Monitor I/Os Correct electrolytes as indicated DVT px: SQ heparin Monitor CBC intermittently Transfuse per usual guidelines Monitor temp, WBC count Micro and abx as above ID following - appreciate recs Cont Amiodarone - 100 mg per tube daily DC metoprolol Cont opiates PRN Cont D10 Infusion for hypoglycemia TFs @  30 cc/hr Vasopressors to keep MAP>60   Prognosis is very poor without hope for functional recovery. Patient is dialysis and Vent dependent with severe sacral decub ulcer with chronic bone infection and spinal infection with underlying CHF and CVA with recurrent pneumonia.   This patient in my opinion is deemed Futile. Husband understands but unrealistic of goals of care  Patient with intermittent delirium with chronic sepsis and can  not make decisions for herself.   Would recommend DNR status and Comfort care measures  I have personally obtained a history, examined the patient, evaluated Pertinent laboratory and RadioGraphic/imaging results, and  formulated the assessment and plan   The Patient requires high complexity decision making for assessment and support, frequent evaluation and titration of therapies, application of advanced monitoring  technologies and extensive interpretation of multiple databases. Critical Care Time devoted to patient care services described in this note is 35 minutes.   Overall, patient is critically ill, prognosis is guarded.  Patient with Multiorgan failure and at high risk for cardiac arrest and death.    Lucie Leather, M.D.  [facility]  Medical Director [facility] Medical Director [facility] Cardio-Pulmonary Department

## 2015-12-02 NOTE — Progress Notes (Signed)
Subjective:  Patient remains critically ill Small eye movement to voice. Did not follow commands BP remains borderline low; iv levophed 2000 cc removed with HD on Monday, 1500 on Wednesday   HEMODIALYSIS FLOWSHEET: 1/11  Blood Flow Rate (mL/min): 250 mL/min Arterial Pressure (mmHg): -120 mmHg Venous Pressure (mmHg): 180 mmHg Transmembrane Pressure (mmHg): 40 mmHg Ultrafiltration Rate (mL/min): 350 mL/min Dialysate Flow Rate (mL/min): 600 ml/min Conductivity: Machine : 14.1 Conductivity: Machine : 14.1 Dialysis Fluid Bolus: Normal Saline Bolus Amount (mL): 200 mL Dialysate Change: Other (comment) (3k) Intra-Hemodialysis Comments: uf removed & tx terminated with rinseabck. PT remains on vent via intubation & opens eyes easily to verbal stimuli.     Objective:  Vital signs in last 24 hours:  Temp:  [96.6 F (35.9 C)-98.2 F (36.8 C)] 97.4 F (36.3 C) (01/13 0800) Pulse Rate:  [34-72] 68 (01/13 0800) Resp:  [9-31] 16 (01/13 0800) BP: (77-135)/(32-78) 114/66 mmHg (01/13 0800) SpO2:  [94 %-100 %] 99 % (01/13 0800) FiO2 (%):  [28 %] 28 % (01/13 0800)  Weight change:  Filed Weights   11/21/15 0500 11/21/15 1540 11/22/15 0500  Weight: 107 kg (235 lb 14.3 oz) 107 kg (235 lb 14.3 oz) 102 kg (224 lb 13.9 oz)    Intake/Output: I/O last 3 completed shifts: In: 2662.4 [I.V.:1052.9; NG/GT:1209.5; IV Piggyback:400] Out: 1000 [Other:1000]   Intake/Output this shift:     Physical Exam: General: critically ill   Head/ENT: Moist oral mucosal membrane, +NG tube  Eyes: Anicteric, eyes closed  Neck: Tracheostomy in place  Lungs:  vent assisted , diffuse crackles  Heart: Regular with ectopic beats  Abdomen:  Soft, nontender,    Extremities:   anasarca  Neurologic: Resting comfortably,    Access:  R IJ permcath.       Basic Metabolic Panel:  Recent Labs Lab 11/25/15 0938 11/28/15 1348 11/30/15 1633  NA 133* 129* 129*  K 3.5 3.8 3.9  CL 97* 95* 97*  CO2 27 26  27   GLUCOSE 78 109* 140*  BUN 72* 102* 95*  CREATININE 1.13* 1.45* 1.38*  CALCIUM 7.8* 8.0* 8.0*  PHOS 4.9* 6.1*  --     Liver Function Tests:  Recent Labs Lab 11/25/15 0938 11/28/15 1348  ALBUMIN 1.5* 1.6*   No results for input(s): LIPASE, AMYLASE in the last 168 hours. No results for input(s): AMMONIA in the last 168 hours.  CBC:  Recent Labs Lab 11/25/15 0938 11/27/15 0436 11/28/15 0458 11/28/15 1348 11/30/15 1633  WBC 12.8* 15.7* 15.6* 19.6* 13.3*  NEUTROABS  --   --   --   --  11.4*  HGB 9.0* 9.1* 9.1* 9.0* 8.9*  HCT 29.2* 29.6* 29.8* 29.6* 29.7*  MCV 91.8 95.9 93.8 94.8 97.5  PLT 99* 92* 94* 81* 68*    Cardiac Enzymes: No results for input(s): CKTOTAL, CKMB, CKMBINDEX, TROPONINI in the last 168 hours.  BNP: Invalid input(s): POCBNP  CBG:  Recent Labs Lab 11/30/15 2324 12/01/15 0726 12/01/15 1603 12/01/15 2334 12/02/15 0729  GLUCAP 142* 134* 132* 150* 134*    Microbiology: Results for orders placed or performed during the hospital encounter of August 22, 2015  Blood Culture (routine x 2)     Status: None   Collection Time: 08-22-2015  8:51 AM  Result Value Ref Range Status   Specimen Description BLOOD [name]  Final   Special Requests BOTTLES DRAWN AEROBIC AND ANAEROBIC  3CC  Final   Culture NO GROWTH 5 DAYS  Final   Report Status 08/17/2015  FINAL  Final  Blood Culture (routine x 2)     Status: None   Collection Time: 27-Aug-2015  9:20 AM  Result Value Ref Range Status   Specimen Description BLOOD LEFT ARM  Final   Special Requests BOTTLES DRAWN AEROBIC AND ANAEROBIC  1CC  Final   Culture NO GROWTH 5 DAYS  Final   Report Status 08/17/2015 FINAL  Final  Wound culture     Status: None   Collection Time: 08/27/15  9:20 AM  Result Value Ref Range Status   Specimen Description DECUBITIS  Final   Special Requests Normal  Final   Gram Stain   Final    FEW WBC SEEN MANY GRAM NEGATIVE RODS RARE GRAM POSITIVE COCCI    Culture   Final    HEAVY GROWTH  ESCHERICHIA COLI MODERATE GROWTH ENTEROBACTER AEROGENES PROTEUS MIRABILIS HEAVY GROWTH ENTEROCOCCUS SPECIES VRE HAVE INTRINSIC RESISTANCE TO MOST COMMONLY USED ANTIBIOTICS AND THE ABILITY TO ACQUIRE RESISTANCE TO MOST AVAILABLE ANTIBIOTICS.    Report Status 08/16/2015 FINAL  Final   Organism ID, Bacteria ESCHERICHIA COLI  Final   Organism ID, Bacteria ENTEROBACTER AEROGENES  Final   Organism ID, Bacteria PROTEUS MIRABILIS  Final   Organism ID, Bacteria ENTEROCOCCUS SPECIES  Final      Susceptibility   Enterobacter aerogenes - MIC*    CEFTAZIDIME <=1 SENSITIVE Sensitive     CEFAZOLIN >=64 RESISTANT Resistant     CEFTRIAXONE <=1 SENSITIVE Sensitive     CIPROFLOXACIN <=0.25 SENSITIVE Sensitive     GENTAMICIN <=1 SENSITIVE Sensitive     IMIPENEM 1 SENSITIVE Sensitive     TRIMETH/SULFA <=20 SENSITIVE Sensitive     * MODERATE GROWTH ENTEROBACTER AEROGENES   Escherichia coli - MIC*    AMPICILLIN <=2 SENSITIVE Sensitive     CEFTAZIDIME <=1 SENSITIVE Sensitive     CEFAZOLIN <=4 SENSITIVE Sensitive     CEFTRIAXONE <=1 SENSITIVE Sensitive     CIPROFLOXACIN <=0.25 SENSITIVE Sensitive     GENTAMICIN <=1 SENSITIVE Sensitive     IMIPENEM <=0.25 SENSITIVE Sensitive     TRIMETH/SULFA <=20 SENSITIVE Sensitive     * HEAVY GROWTH ESCHERICHIA COLI   Proteus mirabilis - MIC*    AMPICILLIN >=32 RESISTANT Resistant     CEFTAZIDIME <=1 SENSITIVE Sensitive     CEFAZOLIN 8 SENSITIVE Sensitive     CEFTRIAXONE <=1 SENSITIVE Sensitive     CIPROFLOXACIN <=0.25 SENSITIVE Sensitive     GENTAMICIN <=1 SENSITIVE Sensitive     IMIPENEM 1 SENSITIVE Sensitive     TRIMETH/SULFA <=20 SENSITIVE Sensitive     * PROTEUS MIRABILIS   Enterococcus species - MIC*    AMPICILLIN >=32 RESISTANT Resistant     VANCOMYCIN >=32 RESISTANT Resistant     GENTAMICIN SYNERGY SENSITIVE Sensitive     TETRACYCLINE Value in next row Resistant      RESISTANT>=16    * HEAVY GROWTH ENTEROCOCCUS SPECIES  MRSA PCR Screening     Status:  None   Collection Time: 2015-08-27  2:38 PM  Result Value Ref Range Status   MRSA by PCR NEGATIVE NEGATIVE Final    Comment:        The GeneXpert MRSA Assay (FDA approved for NASAL specimens only), is one component of a comprehensive MRSA colonization surveillance program. It is not intended to diagnose MRSA infection nor to guide or monitor treatment for MRSA infections.   Blood culture (single)     Status: None   Collection Time: 2015/08/27  3:36 PM  Result  Value Ref Range Status   Specimen Description BLOOD RIGHT ASSIST CONTROL  Final   Special Requests BOTTLES DRAWN AEROBIC AND ANAEROBIC  Final   Culture NO GROWTH 5 DAYS  Final   Report Status 08/17/2015 FINAL  Final  Wound culture     Status: None   Collection Time: 08/13/15 12:37 PM  Result Value Ref Range Status   Specimen Description WOUND  Final   Special Requests Normal  Final   Gram Stain   Final    FEW WBC SEEN TOO NUMEROUS TO COUNT GRAM NEGATIVE RODS FEW GRAM POSITIVE COCCI    Culture   Final    HEAVY GROWTH ESCHERICHIA COLI MODERATE GROWTH PROTEUS MIRABILIS LIGHT GROWTH KLEBSIELLA PNEUMONIAE MODERATE GROWTH ENTEROCOCCUS GALLINARUM CRITICAL RESULT CALLED TO, READ BACK BY AND VERIFIED WITH: [facility] BORBA AT 1042 08/16/15 DV    Report Status 08/17/2015 FINAL  Final   Organism ID, Bacteria ESCHERICHIA COLI  Final   Organism ID, Bacteria PROTEUS MIRABILIS  Final   Organism ID, Bacteria KLEBSIELLA PNEUMONIAE  Final   Organism ID, Bacteria ENTEROCOCCUS GALLINARUM  Final      Susceptibility   Escherichia coli - MIC*    AMPICILLIN >=32 RESISTANT Resistant     CEFTAZIDIME 4 RESISTANT Resistant     CEFAZOLIN >=64 RESISTANT Resistant     CEFTRIAXONE 16 RESISTANT Resistant     GENTAMICIN 2 SENSITIVE Sensitive     IMIPENEM >=16 RESISTANT Resistant     TRIMETH/SULFA <=20 SENSITIVE Sensitive     Extended ESBL POSITIVE Resistant     PIP/TAZO Value in next row Resistant      RESISTANT>=128    CIPROFLOXACIN Value in  next row Sensitive      SENSITIVE<=0.25    * HEAVY GROWTH ESCHERICHIA COLI   Klebsiella pneumoniae - MIC*    AMPICILLIN Value in next row Resistant      SENSITIVE<=0.25    CEFTAZIDIME Value in next row Resistant      SENSITIVE<=0.25    CEFAZOLIN Value in next row Resistant      SENSITIVE<=0.25    CEFTRIAXONE Value in next row Resistant      SENSITIVE<=0.25    CIPROFLOXACIN Value in next row Resistant      SENSITIVE<=0.25    GENTAMICIN Value in next row Sensitive      SENSITIVE<=0.25    IMIPENEM Value in next row Resistant      SENSITIVE<=0.25    TRIMETH/SULFA Value in next row Resistant      SENSITIVE<=0.25    PIP/TAZO Value in next row Resistant      RESISTANT>=128    * LIGHT GROWTH KLEBSIELLA PNEUMONIAE   Proteus mirabilis - MIC*    AMPICILLIN Value in next row Resistant      RESISTANT>=128    CEFTAZIDIME Value in next row Sensitive      RESISTANT>=128    CEFAZOLIN Value in next row Sensitive      RESISTANT>=128    CEFTRIAXONE Value in next row Sensitive      RESISTANT>=128    CIPROFLOXACIN Value in next row Sensitive      RESISTANT>=128    GENTAMICIN Value in next row Sensitive      RESISTANT>=128    IMIPENEM Value in next row Sensitive      RESISTANT>=128    TRIMETH/SULFA Value in next row Sensitive      RESISTANT>=128    PIP/TAZO Value in next row Sensitive      SENSITIVE<=4    * MODERATE  GROWTH PROTEUS MIRABILIS   Enterococcus gallinarum - MIC*    AMPICILLIN Value in next row Resistant      SENSITIVE<=4    GENTAMICIN SYNERGY Value in next row Sensitive      SENSITIVE<=4    CIPROFLOXACIN Value in next row Resistant      RESISTANT>=8    TETRACYCLINE Value in next row Resistant      RESISTANT>=16    * MODERATE GROWTH ENTEROCOCCUS GALLINARUM  Wound culture     Status: None   Collection Time: 08/15/15  2:53 PM  Result Value Ref Range Status   Specimen Description WOUND  Final   Special Requests NONE  Final   Gram Stain FEW WBC SEEN NO ORGANISMS SEEN    Final   Culture NO GROWTH 3 DAYS  Final   Report Status 08/18/2015 FINAL  Final  C difficile quick scan w PCR reflex     Status: None   Collection Time: 08/17/15 11:34 AM  Result Value Ref Range Status   C Diff antigen NEGATIVE NEGATIVE Final   C Diff toxin NEGATIVE NEGATIVE Final   C Diff interpretation Negative for C. difficile  Final  Stool culture     Status: None   Collection Time: 09/03/15  3:51 PM  Result Value Ref Range Status   Specimen Description STOOL  Final   Special Requests Immunocompromised  Final   Culture   Final    NO SALMONELLA OR SHIGELLA ISOLATED No Pathogenic E. coli detected NO CAMPYLOBACTER DETECTED    Report Status 09/07/2015 FINAL  Final  C difficile quick scan w PCR reflex     Status: None   Collection Time: 09/13/15 12:51 AM  Result Value Ref Range Status   C Diff antigen NEGATIVE NEGATIVE Final   C Diff toxin NEGATIVE NEGATIVE Final   C Diff interpretation Negative for C. difficile  Final  Culture, blood (routine x 2)     Status: None   Collection Time: 09/16/15 11:24 AM  Result Value Ref Range Status   Specimen Description BLOOD LEFT HAND  Final   Special Requests BOTTLES DRAWN AEROBIC AND ANAEROBIC  1CC  Final   Culture NO GROWTH 5 DAYS  Final   Report Status 09/21/2015 FINAL  Final  Culture, blood (routine x 2)     Status: None   Collection Time: 09/16/15 12:23 PM  Result Value Ref Range Status   Specimen Description BLOOD RIGHT HAND  Final   Special Requests BOTTLES DRAWN AEROBIC AND ANAEROBIC  1CC  Final   Culture  Setup Time   Final    GRAM POSITIVE COCCI AEROBIC BOTTLE ONLY CRITICAL RESULT CALLED TO, READ BACK BY AND VERIFIED WITH: TESS THOMAS,RN 09/17/2015 0631 BY JRS.    Culture   Final    ENTEROCOCCUS FAECALIS AEROBIC BOTTLE ONLY VRE HAVE INTRINSIC RESISTANCE TO MOST COMMONLY USED ANTIBIOTICS AND THE ABILITY TO ACQUIRE RESISTANCE TO MOST AVAILABLE ANTIBIOTICS. CRITICAL RESULT CALLED TO, READ BACK BY AND VERIFIED WITH: CHERYL SMITH  AT 1610 09/19/15 DV    Report Status 09/21/2015 FINAL  Final   Organism ID, Bacteria ENTEROCOCCUS FAECALIS  Final      Susceptibility   Enterococcus faecalis - MIC*    AMPICILLIN <=2 SENSITIVE Sensitive     LINEZOLID 2 SENSITIVE Sensitive     CIPROFLOXACIN Value in next row Resistant      RESISTANT>=8    TETRACYCLINE Value in next row Resistant      RESISTANT>=16    VANCOMYCIN Value in  next row Resistant      RESISTANT>=32    GENTAMICIN SYNERGY Value in next row Resistant      RESISTANT>=32    * ENTEROCOCCUS FAECALIS  Culture, respiratory (NON-Expectorated)     Status: None   Collection Time: 09/16/15  3:50 PM  Result Value Ref Range Status   Specimen Description TRACHEAL ASPIRATE  Final   Special Requests Immunocompromised  Final   Gram Stain   Final    FEW WBC SEEN GOOD SPECIMEN - 80-90% WBCS RARE GRAM NEGATIVE RODS    Culture LIGHT GROWTH PSEUDOMONAS AERUGINOSA  Final   Report Status 09/19/2015 FINAL  Final   Organism ID, Bacteria PSEUDOMONAS AERUGINOSA  Final      Susceptibility   Pseudomonas aeruginosa - MIC*    CEFTAZIDIME 8 SENSITIVE Sensitive     CIPROFLOXACIN 2 INTERMEDIATE Intermediate     GENTAMICIN >=16 RESISTANT Resistant     IMIPENEM >=16 RESISTANT Resistant     PIP/TAZO Value in next row Sensitive      SENSITIVE32    CEFEPIME Value in next row Sensitive      SENSITIVE8    LEVOFLOXACIN Value in next row Resistant      RESISTANT>=8    * LIGHT GROWTH PSEUDOMONAS AERUGINOSA  Culture, expectorated sputum-assessment     Status: None   Collection Time: 09/22/15  2:09 PM  Result Value Ref Range Status   Specimen Description ENDOTRACHEAL  Final   Special Requests Normal  Final   Sputum evaluation THIS SPECIMEN IS ACCEPTABLE FOR SPUTUM CULTURE  Final   Report Status 09/24/2015 FINAL  Final  Culture, respiratory (NON-Expectorated)     Status: None   Collection Time: 09/22/15  2:09 PM  Result Value Ref Range Status   Specimen Description ENDOTRACHEAL  Final    Special Requests Normal Reflexed from [id number]  Final   Gram Stain   Final    FEW WBC SEEN FEW GRAM NEGATIVE RODS POOR SPECIMEN - LESS THAN 70% WBCS    Culture   Final    MODERATE GROWTH PSEUDOMONAS AERUGINOSA LIGHT GROWTH KLEBSIELLA PNEUMONIAE REFER TO SENSITIVITIES FROM PREVIOUS CULTURE FOR ORG 2    Report Status 10/01/2015 FINAL  Final   Organism ID, Bacteria PSEUDOMONAS AERUGINOSA  Final   Organism ID, Bacteria KLEBSIELLA PNEUMONIAE  Final      Susceptibility   Pseudomonas aeruginosa - MIC*    CEFTAZIDIME 4 SENSITIVE Sensitive     CIPROFLOXACIN 2 INTERMEDIATE Intermediate     GENTAMICIN >=16 RESISTANT Resistant     IMIPENEM >=16 RESISTANT Resistant     PIP/TAZO Value in next row Sensitive      SENSITIVE16    * MODERATE GROWTH PSEUDOMONAS AERUGINOSA  Tissue culture     Status: None   Collection Time: 09/24/15  6:44 AM  Result Value Ref Range Status   Specimen Description BONE  Final   Special Requests Normal  Final   Gram Stain MODERATE WBC SEEN FEW GRAM NEGATIVE RODS   Final   Culture   Final    MODERATE GROWTH ESCHERICHIA COLI MODERATE GROWTH KLEBSIELLA PNEUMONIAE ESBL-EXTENDED SPECTRUM BETA LACTAMASE-THE ORGANISM IS RESISTANT TO PENICILLINS, CEPHALOSPORINS AND AZTREONAM ACCORDING TO CLSI [id number] VOL.25 N01 JAN 2005. ORGANISM 1 This organism isolate is resistant to one or more antiotic agents in three or more antimicrobial categories.  Suggest Infectious Disease consult.   ORGANISM 2 CRITICAL RESULT CALLED TO, READ BACK BY AND VERIFIED WITH: RN Mardene Celeste LINDSAY 09/27/15 1005AM    Report Status 09/28/2015 FINAL  Final   Organism ID, Bacteria ESCHERICHIA COLI  Final   Organism ID, Bacteria KLEBSIELLA PNEUMONIAE  Final      Susceptibility   Escherichia coli - MIC*    AMPICILLIN >=32 RESISTANT Resistant     CEFTAZIDIME 4 RESISTANT Resistant     CEFAZOLIN >=64 RESISTANT Resistant     CEFTRIAXONE 8 RESISTANT Resistant     GENTAMICIN <=1 SENSITIVE Sensitive     IMIPENEM 8  RESISTANT Resistant     TRIMETH/SULFA <=20 SENSITIVE Sensitive     Extended ESBL POSITIVE Resistant     PIP/TAZO Value in next row Resistant      RESISTANT>=128    * MODERATE GROWTH ESCHERICHIA COLI   Klebsiella pneumoniae - MIC*    AMPICILLIN Value in next row Resistant      RESISTANT>=128    CEFTAZIDIME Value in next row Resistant      RESISTANT>=128    CEFAZOLIN Value in next row Resistant      RESISTANT>=128    CEFTRIAXONE Value in next row Resistant      RESISTANT>=128    CIPROFLOXACIN Value in next row Resistant      RESISTANT>=128    GENTAMICIN Value in next row Resistant      RESISTANT>=128    IMIPENEM Value in next row Resistant      RESISTANT>=128    TRIMETH/SULFA Value in next row Sensitive      RESISTANT>=128    PIP/TAZO Value in next row Resistant      RESISTANT>=128    * MODERATE GROWTH KLEBSIELLA PNEUMONIAE  Anaerobic culture     Status: None   Collection Time: 09/24/15  3:55 PM  Result Value Ref Range Status   Specimen Description BONE  Final   Special Requests Normal  Final   Culture NO ANAEROBES ISOLATED  Final   Report Status 09/29/2015 FINAL  Final  Culture, fungus without smear     Status: None   Collection Time: 09/24/15  3:55 PM  Result Value Ref Range Status   Specimen Description BONE  Final   Special Requests Normal  Final   Culture NO FUNGUS ISOLATED AFTER 24 DAYS  Final   Report Status 10/18/2015 FINAL  Final  C difficile quick scan w PCR reflex     Status: None   Collection Time: 10/07/15  2:02 PM  Result Value Ref Range Status   C Diff antigen NEGATIVE NEGATIVE Final   C Diff toxin NEGATIVE NEGATIVE Final   C Diff interpretation Negative for C. difficile  Final  Culture, blood (routine x 2)     Status: None   Collection Time: 10/16/15  9:52 AM  Result Value Ref Range Status   Specimen Description BLOOD LEFT HAND  Final   Special Requests   Final    BOTTLES DRAWN AEROBIC AND ANAEROBIC  AER 6CC ANA 4CC   Culture NO GROWTH 6 DAYS  Final    Report Status 10/22/2015 FINAL  Final  Culture, blood (routine x 2)     Status: None   Collection Time: 10/16/15 12:25 PM  Result Value Ref Range Status   Specimen Description BLOOD LEFT HAND  Final   Special Requests BOTTLES DRAWN AEROBIC AND ANAEROBIC  5CC  Final   Culture NO GROWTH 6 DAYS  Final   Report Status 10/22/2015 FINAL  Final  Culture, expectorated sputum-assessment     Status: None   Collection Time: 10/18/15  1:15 PM  Result Value Ref Range Status   Specimen Description SPUTUM  Final   Special Requests NONE  Final   Sputum evaluation THIS SPECIMEN IS ACCEPTABLE FOR SPUTUM CULTURE  Final   Report Status 10/18/2015 FINAL  Final  Culture, respiratory (NON-Expectorated)     Status: None   Collection Time: 10/18/15  1:15 PM  Result Value Ref Range Status   Specimen Description SPUTUM  Final   Special Requests NONE Reflexed from [id number]  Final   Gram Stain   Final    GOOD SPECIMEN - 80-90% WBCS MODERATE WBC SEEN MANY GRAM NEGATIVE RODS    Culture   Final    HEAVY GROWTH PSEUDOMONAS AERUGINOSA MODERATE GROWTH KLEBSIELLA PNEUMONIAE CRITICAL RESULT CALLED TO, READ BACK BY AND VERIFIED WITHRod Mae AT 1610 10/21/15 DV KLEBSIELLA PNEUMONIAE This organism isolate is resistant to one or more antiotic agents in three or more antimicrobial categories.  Suggest Infectious Disease  consult.      Report Status 10/22/2015 FINAL  Final   Organism ID, Bacteria PSEUDOMONAS AERUGINOSA  Final   Organism ID, Bacteria KLEBSIELLA PNEUMONIAE  Final      Susceptibility   Klebsiella pneumoniae - MIC*    AMPICILLIN >=32 RESISTANT Resistant     CEFAZOLIN >=64 RESISTANT Resistant     CEFTRIAXONE >=64 RESISTANT Resistant     CIPROFLOXACIN >=4 RESISTANT Resistant     GENTAMICIN >=16 RESISTANT Resistant     IMIPENEM >=16 RESISTANT Resistant     NITROFURANTOIN >=512 RESISTANT Resistant     TRIMETH/SULFA <=20 SENSITIVE Sensitive     * MODERATE GROWTH KLEBSIELLA PNEUMONIAE   Pseudomonas  aeruginosa - MIC*    CEFTAZIDIME 4 SENSITIVE Sensitive     CIPROFLOXACIN >=4 RESISTANT Resistant     GENTAMICIN 8 INTERMEDIATE Intermediate     IMIPENEM >=16 RESISTANT Resistant     PIP/TAZO Value in next row Sensitive      SENSITIVE32    * HEAVY GROWTH PSEUDOMONAS AERUGINOSA  C difficile quick scan w PCR reflex     Status: None   Collection Time: 10/18/15  4:39 PM  Result Value Ref Range Status   C Diff antigen NEGATIVE NEGATIVE Final   C Diff toxin NEGATIVE NEGATIVE Final   C Diff interpretation Negative for C. difficile  Final  Culture, blood (Routine X 2) w Reflex to ID Panel     Status: None   Collection Time: 11/06/15  8:27 AM  Result Value Ref Range Status   Specimen Description BLOOD RIGHT HAND  Final   Special Requests BOTTLES DRAWN AEROBIC AND ANAEROBIC 3CC  Final   Culture NO GROWTH 5 DAYS  Final   Report Status 11/11/2015 FINAL  Final  Culture, blood (Routine X 2) w Reflex to ID Panel     Status: None   Collection Time: 11/06/15  8:29 AM  Result Value Ref Range Status   Specimen Description BLOOD LEFT HAND  Final   Special Requests BOTTLES DRAWN AEROBIC AND ANAEROBIC  1CC  Final   Culture NO GROWTH 5 DAYS  Final   Report Status 11/11/2015 FINAL  Final  Culture, expectorated sputum-assessment     Status: None   Collection Time: 11/06/15  8:41 AM  Result Value Ref Range Status   Specimen Description EXPECTORATED SPUTUM  Final   Special Requests Immunocompromised  Final   Sputum evaluation THIS SPECIMEN IS ACCEPTABLE FOR SPUTUM CULTURE  Final   Report Status 11/06/2015 FINAL  Final  Culture, respiratory (NON-Expectorated)     Status: None   Collection Time: 11/06/15  8:41 AM  Result  Value Ref Range Status   Specimen Description EXPECTORATED SPUTUM  Final   Special Requests Immunocompromised Reflexed from [id number]  Final   Gram Stain   Final    MANY WBC SEEN MANY GRAM NEGATIVE RODS EXCELLENT SPECIMEN - 90-100% WBCS    Culture   Final    MODERATE GROWTH KLEBSIELLA  PNEUMONIAE MODERATE GROWTH PSEUDOMONAS AERUGINOSA CRITICAL RESULT CALLED TO, READ BACK BY AND VERIFIED WITH: DR. FITZGERALD AT 1120 11/09/15 DV DR. FITZGERALD REQUESTED ORGANISM BE SENT OUT FOR TIGECYCLINE CEFT/AVIBACTAM AND COLISTIN Sent to [facility] for further susceptibility testing. 11/11/15 CTJ    Report Status 11/15/2015 FINAL  Final   Organism ID, Bacteria KLEBSIELLA PNEUMONIAE  Final   Organism ID, Bacteria PSEUDOMONAS AERUGINOSA  Final      Susceptibility   Klebsiella pneumoniae - MIC*    AMPICILLIN >=32 RESISTANT Resistant     CEFAZOLIN >=64 RESISTANT Resistant     CEFTRIAXONE 32 RESISTANT Resistant     CIPROFLOXACIN >=4 RESISTANT Resistant     GENTAMICIN >=16 RESISTANT Resistant     IMIPENEM >=16 RESISTANT Resistant     NITROFURANTOIN 256 RESISTANT Resistant     TRIMETH/SULFA >=320 RESISTANT Resistant     Extended ESBL POSITIVE Resistant     * MODERATE GROWTH KLEBSIELLA PNEUMONIAE   Pseudomonas aeruginosa - MIC*    CEFTAZIDIME >=64 RESISTANT Resistant     CIPROFLOXACIN 2 INTERMEDIATE Intermediate     GENTAMICIN >=16 RESISTANT Resistant     IMIPENEM >=16 RESISTANT Resistant     * MODERATE GROWTH PSEUDOMONAS AERUGINOSA  Culture, respiratory (NON-Expectorated)     Status: None   Collection Time: 11/14/15  2:07 PM  Result Value Ref Range Status   Specimen Description TRACHEAL ASPIRATE  Final   Special Requests NONE  Final   Gram Stain   Final    EXCELLENT SPECIMEN - 90-100% WBCS MANY WBC SEEN MODERATE GRAM NEGATIVE RODS    Culture   Final    HEAVY GROWTH PSEUDOMONAS AERUGINOSA CRITICAL VALUE NOTED.  VALUE IS CONSISTENT WITH PREVIOUSLY REPORTED AND CALLED VALUE.    Report Status 11/16/2015 FINAL  Final   Organism ID, Bacteria PSEUDOMONAS AERUGINOSA  Final      Susceptibility   Pseudomonas aeruginosa - MIC*    CEFTAZIDIME >=64 RESISTANT Resistant     CIPROFLOXACIN 2 INTERMEDIATE Intermediate     GENTAMICIN >=16 RESISTANT Resistant     IMIPENEM >=16 RESISTANT  Resistant     PIP/TAZO Value in next row Resistant      RESISTANT>=128    TOBRAMYCIN Value in next row Sensitive      SENSITIVE2    * HEAVY GROWTH PSEUDOMONAS AERUGINOSA    Coagulation Studies: No results for input(s): LABPROT, INR in the last 72 hours.  Urinalysis: No results for input(s): COLORURINE, LABSPEC, PHURINE, GLUCOSEU, HGBUR, BILIRUBINUR, KETONESUR, PROTEINUR, UROBILINOGEN, NITRITE, LEUKOCYTESUR in the last 72 hours.  Invalid input(s): APPERANCEUR    Imaging: No results found.   Medications:   . dextrose 25 mL/hr at 12/01/15 0840  . feeding supplement (VITAL AF 1.2 CAL) 1,000 mL (12/01/15 1601)  . norepinephrine (LEVOPHED) Adult infusion 2 mcg/min (12/02/15 [id number])   . albuterol  2.5 mg Nebulization Q6H  . amiodarone  100 mg Per Tube Daily  . antiseptic oral rinse  7 mL Mouth Rinse BID  . antiseptic oral rinse  7 mL Mouth Rinse QID  . aspirin  81 mg Per Tube Daily  . chlorhexidine gluconate  15 mL Mouth Rinse BID  . colistimethate (COLISTIN)  IV  250 mg Intravenous Q48H  . epoetin (EPOGEN/PROCRIT) injection  10,000 Units Intravenous Q M,W,F-HD  . escitalopram  10 mg Per Tube Daily  . feeding supplement (PRO-STAT SUGAR FREE 64)  30 mL Per Tube 6 X Daily  . free water  20 mL Per Tube 6 times per day  . heparin subcutaneous  5,000 Units Subcutaneous Q12H  . hydrocortisone  10 mg Per Tube BID  . lidocaine  1 patch Transdermal Q24H  . midodrine  10 mg Per Tube TID WC  . mirtazapine  7.5 mg Per Tube QHS  . multivitamin  5 mL Per Tube Daily  . pantoprazole sodium  40 mg Per Tube Daily  . sodium chloride  10-40 mL Intracatheter Q12H  . sodium hypochlorite   Irrigation BID  . tigecycline (TYGACIL) IVPB  50 mg Intravenous Q12H  . tobramycin (PF)  300 mg Nebulization BID   acetaminophen (TYLENOL) oral liquid 160 mg/5 mL, albuterol, HYDROmorphone (DILAUDID) injection, loperamide, ondansetron (ZOFRAN) IV, oxyCODONE, polyvinyl alcohol, sodium chloride, zinc  oxide  Assessment/ Plan:  50 y.o. 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 Admitted on 2015/09/10  Echo: 11/07/19: EF of 30% to 35%.   1. End-stage renal disease on hemodialysis on HD MWF. The patient has been on dialysis since October of 2014. R IJ permcath.  - Dialysis treatment with IV albumin for oncotic support. Slow BFR   - Midodrine and hydrocortisone ordered for blood pressure support.  - Continue MWF schedule,   - Overall prognosis is extremely poor- Focus on patient comfort measures. - In my opinion she will not be able to get well enough for an outpatient dialysis placement - recommend DNR  2. Anemia of CKD. Hemoglobin 8.9. Multiple transfusions during admission   - Epogen and periodic blood transfusions. Not a candidate for IV iron due to concurrent infections  3. Acute resp failure: ventilator dependent -prolonged course on ventilator,     4. Anasarca - UF as with HD as tolerated  5. Other:  - Sepsis:  Multi drug resistant organism - large st 4 decubitus - poor nutrition         LOS: 112 Deacon Gadbois 1/13/20178:54 AM

## 2015-12-02 NOTE — Progress Notes (Signed)
MEDICATION RELATED CONSULT NOTE - Follow up   Pharmacy Consult for Renal dosing  Allergies  Allergen Reactions  . Contrast Media [Iodinated Diagnostic Agents] Anaphylaxis  . Ampicillin Rash   Patient Measurements: Ht 475ft 9in Height:  (SCALE BROKE) Weight:  (No bedscale) IBW/kg (Calculated) : 66.2  Vital Signs: Temp: 97.4 F (36.3 C) (01/13 0800) Temp Source: Oral (01/13 0800) BP: 123/56 mmHg (01/13 1415) Pulse Rate: 69 (01/13 1415)  Recent Labs  11/30/15 1633  WBC 13.3*  HGB 8.9*  HCT 29.7*  PLT 68*  CREATININE 1.38*   Estimated Creatinine Clearance: 62 mL/min (by C-G formula based on Cr of 1.38).  Assessment: 50 yo patient on HD. Pharmacy consulted for renal dosing of medications.   Plan:  No medications require adjustment at present. Pharmacy will continue to monitor and dose adjust as needed.     Charlies SilversMichael Laronda Lisby, PharmD   12/02/2015

## 2015-12-03 LAB — CBC
HEMATOCRIT: 28 % — AB (ref 35.0–47.0)
Hemoglobin: 8.5 g/dL — ABNORMAL LOW (ref 12.0–16.0)
MCH: 29.7 pg (ref 26.0–34.0)
MCHC: 30.3 g/dL — ABNORMAL LOW (ref 32.0–36.0)
MCV: 98 fL (ref 80.0–100.0)
Platelets: 58 10*3/uL — ABNORMAL LOW (ref 150–440)
RBC: 2.86 MIL/uL — AB (ref 3.80–5.20)
RDW: 21.3 % — ABNORMAL HIGH (ref 11.5–14.5)
WBC: 15.5 10*3/uL — AB (ref 3.6–11.0)

## 2015-12-03 LAB — GLUCOSE, CAPILLARY
GLUCOSE-CAPILLARY: 146 mg/dL — AB (ref 65–99)
Glucose-Capillary: 106 mg/dL — ABNORMAL HIGH (ref 65–99)
Glucose-Capillary: 104 mg/dL — ABNORMAL HIGH (ref 65–99)

## 2015-12-03 NOTE — Progress Notes (Signed)
Subjective:  Patient remains critically ill Opens eyes to verbal stimuli but did not follow commands BP remains borderline low; iv levophed 2000 cc removed with HD on Monday, 1500 on Wednesday Net UF 0 on Friday   HEMODIALYSIS FLOWSHEET: 1/13  Blood Flow Rate (mL/min): 250 mL/min Arterial Pressure (mmHg): -70 mmHg Venous Pressure (mmHg): 110 mmHg Transmembrane Pressure (mmHg): 40 mmHg Ultrafiltration Rate (mL/min): 400 mL/min Dialysate Flow Rate (mL/min): 600 ml/min Conductivity: Machine : 13.5 Conductivity: Machine : 13.5 Dialysis Fluid Bolus: Normal Saline Bolus Amount (mL): 250 mL Dialysate Change: Other (comment) (3k) Intra-Hemodialysis Comments: HD COMPLETED (TOTAL UF )     Objective:  Vital signs in last 24 hours:  Temp:  [97.4 F (36.3 C)-99.6 F (37.6 C)] 98.8 F (37.1 C) (01/14 1100) Pulse Rate:  [34-91] 84 (01/14 1215) Resp:  [10-37] 15 (01/14 1215) BP: (50-146)/(28-105) 119/55 mmHg (01/14 1215) SpO2:  [89 %-100 %] 91 % (01/14 1215) FiO2 (%):  [30 %-40 %] 40 % (01/14 1228)  Weight change:  Filed Weights   11/21/15 0500 11/21/15 1540 11/22/15 0500  Weight: 107 kg (235 lb 14.3 oz) 107 kg (235 lb 14.3 oz) 102 kg (224 lb 13.9 oz)    Intake/Output: I/O last 3 completed shifts: In: 3260.3 [I.V.:1163.6; NG/GT:1490; IV Piggyback:606.7] Out: 0    Intake/Output this shift:  Total I/O In: 608.1 [I.V.:178.1; NG/GT:430] Out: -   Physical Exam: General: critically ill   Head/ENT: Moist oral mucosal membrane, +NG tube  Eyes: Anicteric, eyes closed  Neck: Tracheostomy in place  Lungs:  vent assisted , diffuse crackles  Heart: Regular with ectopic beats  Abdomen:  Soft, nontender,    Extremities:   anasarca  Neurologic: Resting comfortably,    Access:  R IJ permcath.       Basic Metabolic Panel:  Recent Labs Lab 11/28/15 1348 11/30/15 1633 12/02/15 1609  NA 129* 129* 132*  K 3.8 3.9 3.7  CL 95* 97* 96*  CO2 26 27 29   GLUCOSE 109* 140* 186*   BUN 102* 95* 95*  CREATININE 1.45* 1.38* 1.34*  CALCIUM 8.0* 8.0* 8.2*  PHOS 6.1*  --  5.6*    Liver Function Tests:  Recent Labs Lab 11/28/15 1348 12/02/15 1609  ALBUMIN 1.6* 1.6*   No results for input(s): LIPASE, AMYLASE in the last 168 hours. No results for input(s): AMMONIA in the last 168 hours.  CBC:  Recent Labs Lab 11/28/15 0458 11/28/15 1348 11/30/15 1633 12/02/15 1609 12/03/15 0552  WBC 15.6* 19.6* 13.3* 13.5* 15.5*  NEUTROABS  --   --  11.4*  --   --   HGB 9.1* 9.0* 8.9* 8.8* 8.5*  HCT 29.8* 29.6* 29.7* 30.4* 28.0*  MCV 93.8 94.8 97.5 98.8 98.0  PLT 94* 81* 68* 58* 58*    Cardiac Enzymes: No results for input(s): CKTOTAL, CKMB, CKMBINDEX, TROPONINI in the last 168 hours.  BNP: Invalid input(s): POCBNP  CBG:  Recent Labs Lab 12/01/15 2334 12/02/15 0729 12/02/15 1552 12/02/15 2339 12/03/15 0752  GLUCAP 150* 134* 172* 117* 106*    Microbiology: Results for orders placed or performed during the hospital encounter of 08/01/2015  Blood Culture (routine x 2)     Status: None   Collection Time: 07/25/2015  8:51 AM  Result Value Ref Range Status   Specimen Description BLOOD [name]  Final   Special Requests BOTTLES DRAWN AEROBIC AND ANAEROBIC  3CC  Final   Culture NO GROWTH 5 DAYS  Final   Report Status 08/17/2015 FINAL  Final  Blood Culture (routine x 2)     Status: None   Collection Time: 08/18/2015  9:20 AM  Result Value Ref Range Status   Specimen Description BLOOD LEFT ARM  Final   Special Requests BOTTLES DRAWN AEROBIC AND ANAEROBIC  1CC  Final   Culture NO GROWTH 5 DAYS  Final   Report Status 08/17/2015 FINAL  Final  Wound culture     Status: None   Collection Time: 08/04/2015  9:20 AM  Result Value Ref Range Status   Specimen Description DECUBITIS  Final   Special Requests Normal  Final   Gram Stain   Final    FEW WBC SEEN MANY GRAM NEGATIVE RODS RARE GRAM POSITIVE COCCI    Culture   Final    HEAVY GROWTH ESCHERICHIA COLI MODERATE  GROWTH ENTEROBACTER AEROGENES PROTEUS MIRABILIS HEAVY GROWTH ENTEROCOCCUS SPECIES VRE HAVE INTRINSIC RESISTANCE TO MOST COMMONLY USED ANTIBIOTICS AND THE ABILITY TO ACQUIRE RESISTANCE TO MOST AVAILABLE ANTIBIOTICS.    Report Status 08/16/2015 FINAL  Final   Organism ID, Bacteria ESCHERICHIA COLI  Final   Organism ID, Bacteria ENTEROBACTER AEROGENES  Final   Organism ID, Bacteria PROTEUS MIRABILIS  Final   Organism ID, Bacteria ENTEROCOCCUS SPECIES  Final      Susceptibility   Enterobacter aerogenes - MIC*    CEFTAZIDIME <=1 SENSITIVE Sensitive     CEFAZOLIN >=64 RESISTANT Resistant     CEFTRIAXONE <=1 SENSITIVE Sensitive     CIPROFLOXACIN <=0.25 SENSITIVE Sensitive     GENTAMICIN <=1 SENSITIVE Sensitive     IMIPENEM 1 SENSITIVE Sensitive     TRIMETH/SULFA <=20 SENSITIVE Sensitive     * MODERATE GROWTH ENTEROBACTER AEROGENES   Escherichia coli - MIC*    AMPICILLIN <=2 SENSITIVE Sensitive     CEFTAZIDIME <=1 SENSITIVE Sensitive     CEFAZOLIN <=4 SENSITIVE Sensitive     CEFTRIAXONE <=1 SENSITIVE Sensitive     CIPROFLOXACIN <=0.25 SENSITIVE Sensitive     GENTAMICIN <=1 SENSITIVE Sensitive     IMIPENEM <=0.25 SENSITIVE Sensitive     TRIMETH/SULFA <=20 SENSITIVE Sensitive     * HEAVY GROWTH ESCHERICHIA COLI   Proteus mirabilis - MIC*    AMPICILLIN >=32 RESISTANT Resistant     CEFTAZIDIME <=1 SENSITIVE Sensitive     CEFAZOLIN 8 SENSITIVE Sensitive     CEFTRIAXONE <=1 SENSITIVE Sensitive     CIPROFLOXACIN <=0.25 SENSITIVE Sensitive     GENTAMICIN <=1 SENSITIVE Sensitive     IMIPENEM 1 SENSITIVE Sensitive     TRIMETH/SULFA <=20 SENSITIVE Sensitive     * PROTEUS MIRABILIS   Enterococcus species - MIC*    AMPICILLIN >=32 RESISTANT Resistant     VANCOMYCIN >=32 RESISTANT Resistant     GENTAMICIN SYNERGY SENSITIVE Sensitive     TETRACYCLINE Value in next row Resistant      RESISTANT>=16    * HEAVY GROWTH ENTEROCOCCUS SPECIES  MRSA PCR Screening     Status: None   Collection Time:  08/19/2015  2:38 PM  Result Value Ref Range Status   MRSA by PCR NEGATIVE NEGATIVE Final    Comment:        The GeneXpert MRSA Assay (FDA approved for NASAL specimens only), is one component of a comprehensive MRSA colonization surveillance program. It is not intended to diagnose MRSA infection nor to guide or monitor treatment for MRSA infections.   Blood culture (single)     Status: None   Collection Time: 07/22/2015  3:36 PM  Result Value Ref  Range Status   Specimen Description BLOOD RIGHT ASSIST CONTROL  Final   Special Requests BOTTLES DRAWN AEROBIC AND ANAEROBIC 5ML  Final   Culture NO GROWTH 5 DAYS  Final   Report Status 08/17/2015 FINAL  Final  Wound culture     Status: None   Collection Time: 08/13/15 12:37 PM  Result Value Ref Range Status   Specimen Description WOUND  Final   Special Requests Normal  Final   Gram Stain   Final    FEW WBC SEEN TOO NUMEROUS TO COUNT GRAM NEGATIVE RODS FEW GRAM POSITIVE COCCI    Culture   Final    HEAVY GROWTH ESCHERICHIA COLI MODERATE GROWTH PROTEUS MIRABILIS LIGHT GROWTH KLEBSIELLA PNEUMONIAE MODERATE GROWTH ENTEROCOCCUS GALLINARUM CRITICAL RESULT CALLED TO, READ BACK BY AND VERIFIED WITH: [facility] AT 1042 08/16/15 DV    Report Status 08/17/2015 FINAL  Final   Organism ID, Bacteria ESCHERICHIA COLI  Final   Organism ID, Bacteria PROTEUS MIRABILIS  Final   Organism ID, Bacteria KLEBSIELLA PNEUMONIAE  Final   Organism ID, Bacteria ENTEROCOCCUS GALLINARUM  Final      Susceptibility   Escherichia coli - MIC*    AMPICILLIN >=32 RESISTANT Resistant     CEFTAZIDIME 4 RESISTANT Resistant     CEFAZOLIN >=64 RESISTANT Resistant     CEFTRIAXONE 16 RESISTANT Resistant     GENTAMICIN 2 SENSITIVE Sensitive     IMIPENEM >=16 RESISTANT Resistant     TRIMETH/SULFA <=20 SENSITIVE Sensitive     Extended ESBL POSITIVE Resistant     PIP/TAZO Value in next row Resistant      RESISTANT>=128    CIPROFLOXACIN Value in next row Sensitive       SENSITIVE<=0.25    * HEAVY GROWTH ESCHERICHIA COLI   Klebsiella pneumoniae - MIC*    AMPICILLIN Value in next row Resistant      SENSITIVE<=0.25    CEFTAZIDIME Value in next row Resistant      SENSITIVE<=0.25    CEFAZOLIN Value in next row Resistant      SENSITIVE<=0.25    CEFTRIAXONE Value in next row Resistant      SENSITIVE<=0.25    CIPROFLOXACIN Value in next row Resistant      SENSITIVE<=0.25    GENTAMICIN Value in next row Sensitive      SENSITIVE<=0.25    IMIPENEM Value in next row Resistant      SENSITIVE<=0.25    TRIMETH/SULFA Value in next row Resistant      SENSITIVE<=0.25    PIP/TAZO Value in next row Resistant      RESISTANT>=128    * LIGHT GROWTH KLEBSIELLA PNEUMONIAE   Proteus mirabilis - MIC*    AMPICILLIN Value in next row Resistant      RESISTANT>=128    CEFTAZIDIME Value in next row Sensitive      RESISTANT>=128    CEFAZOLIN Value in next row Sensitive      RESISTANT>=128    CEFTRIAXONE Value in next row Sensitive      RESISTANT>=128    CIPROFLOXACIN Value in next row Sensitive      RESISTANT>=128    GENTAMICIN Value in next row Sensitive      RESISTANT>=128    IMIPENEM Value in next row Sensitive      RESISTANT>=128    TRIMETH/SULFA Value in next row Sensitive      RESISTANT>=128    PIP/TAZO Value in next row Sensitive      SENSITIVE<=4    * MODERATE GROWTH PROTEUS  MIRABILIS   Enterococcus gallinarum - MIC*    AMPICILLIN Value in next row Resistant      SENSITIVE<=4    GENTAMICIN SYNERGY Value in next row Sensitive      SENSITIVE<=4    CIPROFLOXACIN Value in next row Resistant      RESISTANT>=8    TETRACYCLINE Value in next row Resistant      RESISTANT>=16    * MODERATE GROWTH ENTEROCOCCUS GALLINARUM  Wound culture     Status: None   Collection Time: 08/15/15  2:53 PM  Result Value Ref Range Status   Specimen Description WOUND  Final   Special Requests NONE  Final   Gram Stain FEW WBC SEEN NO ORGANISMS SEEN   Final   Culture NO GROWTH 3  DAYS  Final   Report Status 08/18/2015 FINAL  Final  C difficile quick scan w PCR reflex     Status: None   Collection Time: 08/17/15 11:34 AM  Result Value Ref Range Status   C Diff antigen NEGATIVE NEGATIVE Final   C Diff toxin NEGATIVE NEGATIVE Final   C Diff interpretation Negative for C. difficile  Final  Stool culture     Status: None   Collection Time: 09/03/15  3:51 PM  Result Value Ref Range Status   Specimen Description STOOL  Final   Special Requests Immunocompromised  Final   Culture   Final    NO SALMONELLA OR SHIGELLA ISOLATED No Pathogenic E. coli detected NO CAMPYLOBACTER DETECTED    Report Status 09/07/2015 FINAL  Final  C difficile quick scan w PCR reflex     Status: None   Collection Time: 09/13/15 12:51 AM  Result Value Ref Range Status   C Diff antigen NEGATIVE NEGATIVE Final   C Diff toxin NEGATIVE NEGATIVE Final   C Diff interpretation Negative for C. difficile  Final  Culture, blood (routine x 2)     Status: None   Collection Time: 09/16/15 11:24 AM  Result Value Ref Range Status   Specimen Description BLOOD LEFT HAND  Final   Special Requests BOTTLES DRAWN AEROBIC AND ANAEROBIC  1CC  Final   Culture NO GROWTH 5 DAYS  Final   Report Status 09/21/2015 FINAL  Final  Culture, blood (routine x 2)     Status: None   Collection Time: 09/16/15 12:23 PM  Result Value Ref Range Status   Specimen Description BLOOD RIGHT HAND  Final   Special Requests BOTTLES DRAWN AEROBIC AND ANAEROBIC  1CC  Final   Culture  Setup Time   Final    GRAM POSITIVE COCCI AEROBIC BOTTLE ONLY CRITICAL RESULT CALLED TO, READ BACK BY AND VERIFIED WITH: TESS THOMAS,RN 09/17/2015 0631 BY JRS.    Culture   Final    ENTEROCOCCUS FAECALIS AEROBIC BOTTLE ONLY VRE HAVE INTRINSIC RESISTANCE TO MOST COMMONLY USED ANTIBIOTICS AND THE ABILITY TO ACQUIRE RESISTANCE TO MOST AVAILABLE ANTIBIOTICS. CRITICAL RESULT CALLED TO, READ BACK BY AND VERIFIED WITH: CHERYL SMITH AT [id number] 09/19/15 DV     Report Status 09/21/2015 FINAL  Final   Organism ID, Bacteria ENTEROCOCCUS FAECALIS  Final      Susceptibility   Enterococcus faecalis - MIC*    AMPICILLIN <=2 SENSITIVE Sensitive     LINEZOLID 2 SENSITIVE Sensitive     CIPROFLOXACIN Value in next row Resistant      RESISTANT>=8    TETRACYCLINE Value in next row Resistant      RESISTANT>=16    VANCOMYCIN Value in next row  Resistant      RESISTANT>=32    GENTAMICIN SYNERGY Value in next row Resistant      RESISTANT>=32    * ENTEROCOCCUS FAECALIS  Culture, respiratory (NON-Expectorated)     Status: None   Collection Time: 09/16/15  3:50 PM  Result Value Ref Range Status   Specimen Description TRACHEAL ASPIRATE  Final   Special Requests Immunocompromised  Final   Gram Stain   Final    FEW WBC SEEN GOOD SPECIMEN - 80-90% WBCS RARE GRAM NEGATIVE RODS    Culture LIGHT GROWTH PSEUDOMONAS AERUGINOSA  Final   Report Status 09/19/2015 FINAL  Final   Organism ID, Bacteria PSEUDOMONAS AERUGINOSA  Final      Susceptibility   Pseudomonas aeruginosa - MIC*    CEFTAZIDIME 8 SENSITIVE Sensitive     CIPROFLOXACIN 2 INTERMEDIATE Intermediate     GENTAMICIN >=16 RESISTANT Resistant     IMIPENEM >=16 RESISTANT Resistant     PIP/TAZO Value in next row Sensitive      SENSITIVE32    CEFEPIME Value in next row Sensitive      SENSITIVE8    LEVOFLOXACIN Value in next row Resistant      RESISTANT>=8    * LIGHT GROWTH PSEUDOMONAS AERUGINOSA  Culture, expectorated sputum-assessment     Status: None   Collection Time: 09/22/15  2:09 PM  Result Value Ref Range Status   Specimen Description ENDOTRACHEAL  Final   Special Requests Normal  Final   Sputum evaluation THIS SPECIMEN IS ACCEPTABLE FOR SPUTUM CULTURE  Final   Report Status 09/24/2015 FINAL  Final  Culture, respiratory (NON-Expectorated)     Status: None   Collection Time: 09/22/15  2:09 PM  Result Value Ref Range Status   Specimen Description ENDOTRACHEAL  Final   Special Requests Normal  Reflexed from [id number]  Final   Gram Stain   Final    FEW WBC SEEN FEW GRAM NEGATIVE RODS POOR SPECIMEN - LESS THAN 70% WBCS    Culture   Final    MODERATE GROWTH PSEUDOMONAS AERUGINOSA LIGHT GROWTH KLEBSIELLA PNEUMONIAE REFER TO SENSITIVITIES FROM PREVIOUS CULTURE FOR ORG 2    Report Status 10/01/2015 FINAL  Final   Organism ID, Bacteria PSEUDOMONAS AERUGINOSA  Final   Organism ID, Bacteria KLEBSIELLA PNEUMONIAE  Final      Susceptibility   Pseudomonas aeruginosa - MIC*    CEFTAZIDIME 4 SENSITIVE Sensitive     CIPROFLOXACIN 2 INTERMEDIATE Intermediate     GENTAMICIN >=16 RESISTANT Resistant     IMIPENEM >=16 RESISTANT Resistant     PIP/TAZO Value in next row Sensitive      SENSITIVE16    * MODERATE GROWTH PSEUDOMONAS AERUGINOSA  Tissue culture     Status: None   Collection Time: 09/24/15  6:44 AM  Result Value Ref Range Status   Specimen Description BONE  Final   Special Requests Normal  Final   Gram Stain MODERATE WBC SEEN FEW GRAM NEGATIVE RODS   Final   Culture   Final    MODERATE GROWTH ESCHERICHIA COLI MODERATE GROWTH KLEBSIELLA PNEUMONIAE ESBL-EXTENDED SPECTRUM BETA LACTAMASE-THE ORGANISM IS RESISTANT TO PENICILLINS, CEPHALOSPORINS AND AZTREONAM ACCORDING TO CLSI M100-S15 VOL.25 N01 JAN 2005. ORGANISM 1 This organism isolate is resistant to one or more antiotic agents in three or more antimicrobial categories.  Suggest Infectious Disease consult.   ORGANISM 2 CRITICAL RESULT CALLED TO, READ BACK BY AND VERIFIED WITH: RN Mardene Celeste LINDSAY 09/27/15 1005AM    Report Status 09/28/2015 FINAL  Final  Organism ID, Bacteria ESCHERICHIA COLI  Final   Organism ID, Bacteria KLEBSIELLA PNEUMONIAE  Final      Susceptibility   Escherichia coli - MIC*    AMPICILLIN >=32 RESISTANT Resistant     CEFTAZIDIME 4 RESISTANT Resistant     CEFAZOLIN >=64 RESISTANT Resistant     CEFTRIAXONE 8 RESISTANT Resistant     GENTAMICIN <=1 SENSITIVE Sensitive     IMIPENEM 8 RESISTANT Resistant      TRIMETH/SULFA <=20 SENSITIVE Sensitive     Extended ESBL POSITIVE Resistant     PIP/TAZO Value in next row Resistant      RESISTANT>=128    * MODERATE GROWTH ESCHERICHIA COLI   Klebsiella pneumoniae - MIC*    AMPICILLIN Value in next row Resistant      RESISTANT>=128    CEFTAZIDIME Value in next row Resistant      RESISTANT>=128    CEFAZOLIN Value in next row Resistant      RESISTANT>=128    CEFTRIAXONE Value in next row Resistant      RESISTANT>=128    CIPROFLOXACIN Value in next row Resistant      RESISTANT>=128    GENTAMICIN Value in next row Resistant      RESISTANT>=128    IMIPENEM Value in next row Resistant      RESISTANT>=128    TRIMETH/SULFA Value in next row Sensitive      RESISTANT>=128    PIP/TAZO Value in next row Resistant      RESISTANT>=128    * MODERATE GROWTH KLEBSIELLA PNEUMONIAE  Anaerobic culture     Status: None   Collection Time: 09/24/15  3:55 PM  Result Value Ref Range Status   Specimen Description BONE  Final   Special Requests Normal  Final   Culture NO ANAEROBES ISOLATED  Final   Report Status 09/29/2015 FINAL  Final  Culture, fungus without smear     Status: None   Collection Time: 09/24/15  3:55 PM  Result Value Ref Range Status   Specimen Description BONE  Final   Special Requests Normal  Final   Culture NO FUNGUS ISOLATED AFTER 24 DAYS  Final   Report Status 10/18/2015 FINAL  Final  C difficile quick scan w PCR reflex     Status: None   Collection Time: 10/07/15  2:02 PM  Result Value Ref Range Status   C Diff antigen NEGATIVE NEGATIVE Final   C Diff toxin NEGATIVE NEGATIVE Final   C Diff interpretation Negative for C. difficile  Final  Culture, blood (routine x 2)     Status: None   Collection Time: 10/16/15  9:52 AM  Result Value Ref Range Status   Specimen Description BLOOD LEFT HAND  Final   Special Requests   Final    BOTTLES DRAWN AEROBIC AND ANAEROBIC  AER 6CC ANA 4CC   Culture NO GROWTH 6 DAYS  Final   Report Status  10/22/2015 FINAL  Final  Culture, blood (routine x 2)     Status: None   Collection Time: 10/16/15 12:25 PM  Result Value Ref Range Status   Specimen Description BLOOD LEFT HAND  Final   Special Requests BOTTLES DRAWN AEROBIC AND ANAEROBIC  5CC  Final   Culture NO GROWTH 6 DAYS  Final   Report Status 10/22/2015 FINAL  Final  Culture, expectorated sputum-assessment     Status: None   Collection Time: 10/18/15  1:15 PM  Result Value Ref Range Status   Specimen Description SPUTUM  Final  Special Requests NONE  Final   Sputum evaluation THIS SPECIMEN IS ACCEPTABLE FOR SPUTUM CULTURE  Final   Report Status 10/18/2015 FINAL  Final  Culture, respiratory (NON-Expectorated)     Status: None   Collection Time: 10/18/15  1:15 PM  Result Value Ref Range Status   Specimen Description SPUTUM  Final   Special Requests NONE Reflexed from [id number]  Final   Gram Stain   Final    GOOD SPECIMEN - 80-90% WBCS MODERATE WBC SEEN MANY GRAM NEGATIVE RODS    Culture   Final    HEAVY GROWTH PSEUDOMONAS AERUGINOSA MODERATE GROWTH KLEBSIELLA PNEUMONIAE CRITICAL RESULT CALLED TO, READ BACK BY AND VERIFIED WITHRod Mae AT 5784 10/21/15 DV KLEBSIELLA PNEUMONIAE This organism isolate is resistant to one or more antiotic agents in three or more antimicrobial categories.  Suggest Infectious Disease  consult.      Report Status 10/22/2015 FINAL  Final   Organism ID, Bacteria PSEUDOMONAS AERUGINOSA  Final   Organism ID, Bacteria KLEBSIELLA PNEUMONIAE  Final      Susceptibility   Klebsiella pneumoniae - MIC*    AMPICILLIN >=32 RESISTANT Resistant     CEFAZOLIN >=64 RESISTANT Resistant     CEFTRIAXONE >=64 RESISTANT Resistant     CIPROFLOXACIN >=4 RESISTANT Resistant     GENTAMICIN >=16 RESISTANT Resistant     IMIPENEM >=16 RESISTANT Resistant     NITROFURANTOIN >=512 RESISTANT Resistant     TRIMETH/SULFA <=20 SENSITIVE Sensitive     * MODERATE GROWTH KLEBSIELLA PNEUMONIAE   Pseudomonas aeruginosa - MIC*     CEFTAZIDIME 4 SENSITIVE Sensitive     CIPROFLOXACIN >=4 RESISTANT Resistant     GENTAMICIN 8 INTERMEDIATE Intermediate     IMIPENEM >=16 RESISTANT Resistant     PIP/TAZO Value in next row Sensitive      SENSITIVE32    * HEAVY GROWTH PSEUDOMONAS AERUGINOSA  C difficile quick scan w PCR reflex     Status: None   Collection Time: 10/18/15  4:39 PM  Result Value Ref Range Status   C Diff antigen NEGATIVE NEGATIVE Final   C Diff toxin NEGATIVE NEGATIVE Final   C Diff interpretation Negative for C. difficile  Final  Culture, blood (Routine X 2) w Reflex to ID Panel     Status: None   Collection Time: 11/06/15  8:27 AM  Result Value Ref Range Status   Specimen Description BLOOD RIGHT HAND  Final   Special Requests BOTTLES DRAWN AEROBIC AND ANAEROBIC 3CC  Final   Culture NO GROWTH 5 DAYS  Final   Report Status 11/11/2015 FINAL  Final  Culture, blood (Routine X 2) w Reflex to ID Panel     Status: None   Collection Time: 11/06/15  8:29 AM  Result Value Ref Range Status   Specimen Description BLOOD LEFT HAND  Final   Special Requests BOTTLES DRAWN AEROBIC AND ANAEROBIC  1CC  Final   Culture NO GROWTH 5 DAYS  Final   Report Status 11/11/2015 FINAL  Final  Culture, expectorated sputum-assessment     Status: None   Collection Time: 11/06/15  8:41 AM  Result Value Ref Range Status   Specimen Description EXPECTORATED SPUTUM  Final   Special Requests Immunocompromised  Final   Sputum evaluation THIS SPECIMEN IS ACCEPTABLE FOR SPUTUM CULTURE  Final   Report Status 11/06/2015 FINAL  Final  Culture, respiratory (NON-Expectorated)     Status: None   Collection Time: 11/06/15  8:41 AM  Result Value Ref Range  Status   Specimen Description EXPECTORATED SPUTUM  Final   Special Requests Immunocompromised Reflexed from [id number]  Final   Gram Stain   Final    MANY WBC SEEN MANY GRAM NEGATIVE RODS EXCELLENT SPECIMEN - 90-100% WBCS    Culture   Final    MODERATE GROWTH KLEBSIELLA  PNEUMONIAE MODERATE GROWTH PSEUDOMONAS AERUGINOSA CRITICAL RESULT CALLED TO, READ BACK BY AND VERIFIED WITH: DR. FITZGERALD AT 1120 11/09/15 DV DR. FITZGERALD REQUESTED ORGANISM BE SENT OUT FOR TIGECYCLINE CEFT/AVIBACTAM AND COLISTIN Sent to [facility] for further susceptibility testing. 11/11/15 CTJ    Report Status 11/15/2015 FINAL  Final   Organism ID, Bacteria KLEBSIELLA PNEUMONIAE  Final   Organism ID, Bacteria PSEUDOMONAS AERUGINOSA  Final      Susceptibility   Klebsiella pneumoniae - MIC*    AMPICILLIN >=32 RESISTANT Resistant     CEFAZOLIN >=64 RESISTANT Resistant     CEFTRIAXONE 32 RESISTANT Resistant     CIPROFLOXACIN >=4 RESISTANT Resistant     GENTAMICIN >=16 RESISTANT Resistant     IMIPENEM >=16 RESISTANT Resistant     NITROFURANTOIN 256 RESISTANT Resistant     TRIMETH/SULFA >=320 RESISTANT Resistant     Extended ESBL POSITIVE Resistant     * MODERATE GROWTH KLEBSIELLA PNEUMONIAE   Pseudomonas aeruginosa - MIC*    CEFTAZIDIME >=64 RESISTANT Resistant     CIPROFLOXACIN 2 INTERMEDIATE Intermediate     GENTAMICIN >=16 RESISTANT Resistant     IMIPENEM >=16 RESISTANT Resistant     * MODERATE GROWTH PSEUDOMONAS AERUGINOSA  Culture, respiratory (NON-Expectorated)     Status: None   Collection Time: 11/14/15  2:07 PM  Result Value Ref Range Status   Specimen Description TRACHEAL ASPIRATE  Final   Special Requests NONE  Final   Gram Stain   Final    EXCELLENT SPECIMEN - 90-100% WBCS MANY WBC SEEN MODERATE GRAM NEGATIVE RODS    Culture   Final    HEAVY GROWTH PSEUDOMONAS AERUGINOSA CRITICAL VALUE NOTED.  VALUE IS CONSISTENT WITH PREVIOUSLY REPORTED AND CALLED VALUE.    Report Status 11/16/2015 FINAL  Final   Organism ID, Bacteria PSEUDOMONAS AERUGINOSA  Final      Susceptibility   Pseudomonas aeruginosa - MIC*    CEFTAZIDIME >=64 RESISTANT Resistant     CIPROFLOXACIN 2 INTERMEDIATE Intermediate     GENTAMICIN >=16 RESISTANT Resistant     IMIPENEM >=16 RESISTANT  Resistant     PIP/TAZO Value in next row Resistant      RESISTANT>=128    TOBRAMYCIN Value in next row Sensitive      SENSITIVE2    * HEAVY GROWTH PSEUDOMONAS AERUGINOSA    Coagulation Studies: No results for input(s): LABPROT, INR in the last 72 hours.  Urinalysis: No results for input(s): COLORURINE, LABSPEC, PHURINE, GLUCOSEU, HGBUR, BILIRUBINUR, KETONESUR, PROTEINUR, UROBILINOGEN, NITRITE, LEUKOCYTESUR in the last 72 hours.  Invalid input(s): APPERANCEUR    Imaging: No results found.   Medications:   . dextrose 25 mL/hr at 12/03/15 0700  . feeding supplement (VITAL AF 1.2 CAL) 1,000 mL (12/03/15 0800)  . norepinephrine (LEVOPHED) Adult infusion 12 mcg/min (12/03/15 1200)   . albuterol  2.5 mg Nebulization Q6H  . amiodarone  100 mg Per Tube Daily  . antiseptic oral rinse  7 mL Mouth Rinse BID  . antiseptic oral rinse  7 mL Mouth Rinse QID  . aspirin  81 mg Per Tube Daily  . chlorhexidine gluconate  15 mL Mouth Rinse BID  . colistimethate (COLISTIN) IV  250  mg Intravenous Q48H  . epoetin (EPOGEN/PROCRIT) injection  10,000 Units Intravenous Q M,W,F-HD  . escitalopram  10 mg Per Tube Daily  . feeding supplement (PRO-STAT SUGAR FREE 64)  30 mL Per Tube 6 X Daily  . free water  20 mL Per Tube 6 times per day  . heparin subcutaneous  5,000 Units Subcutaneous Q12H  . hydrocortisone  10 mg Per Tube BID  . lidocaine  1 patch Transdermal Q24H  . midodrine  10 mg Per Tube TID WC  . mirtazapine  7.5 mg Per Tube QHS  . multivitamin  5 mL Per Tube Daily  . pantoprazole sodium  40 mg Per Tube Daily  . sodium chloride  10-40 mL Intracatheter Q12H  . sodium hypochlorite   Irrigation BID  . tigecycline (TYGACIL) IVPB  50 mg Intravenous Q12H  . tobramycin (PF)  300 mg Nebulization BID   acetaminophen (TYLENOL) oral liquid 160 mg/5 mL, albuterol, HYDROmorphone (DILAUDID) injection, loperamide, ondansetron (ZOFRAN) IV, oxyCODONE, polyvinyl alcohol, sodium chloride, zinc  oxide  Assessment/ Plan:  50 y.o. 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 Admitted on 19-Aug-2015  Echo: 11/07/19: EF of 30% to 35%.   1. End-stage renal disease on hemodialysis on HD MWF. The patient has been on dialysis since October of 2014. R IJ permcath.  - Dialysis treatment with IV albumin for oncotic support. Slow BFR   - Midodrine and hydrocortisone ordered for blood pressure support.  - Continue MWF schedule,  Did not do well with HD this past Friday. No fluid coud be removed despite iv albumin, decreased temp and increased dose of iv levophed - Overall prognosis is extremely poor- Focus on patient comfort measures. - In my opinion she will not be able to get well enough for an outpatient dialysis placement - recommend DNR  2. Anemia of CKD. Hemoglobin 8.9. Multiple transfusions during admission   - Epogen and periodic blood transfusions. Not a candidate for IV iron due to concurrent infections  3. Acute resp failure: ventilator dependent -prolonged course on ventilator,     4. Anasarca - UF as with HD as tolerated  5. Other:  - Sepsis:  Multi drug resistant organism - large st 4 decubitus - poor nutrition         LOS: 113 Bekka Qian 1/14/201712:42 PM

## 2015-12-03 NOTE — Progress Notes (Signed)
PULMONARY / CRITICAL CARE MEDICINE   Name: Courtney GrinderHedda McMillian [name] MRN: [id number] DOB: January 13, 1966    ADMISSION DATE:  08/02/2015  BRIEF HISTORY: 49 AAF who has been in medical facilities (hosp, LTAC, rehab) for 2 yrs following gastric bypass surgery with multiple complications. Now with chronic trach, ESRD, profound debilitation, severe sacral pressure ulcer. Was seemingly making progress and transferred to rehab facility approx one week prior to this admission. She was sent to [facility] ED with AMS and hypotension. Working dx of severe sepsis/septic shock due to infected sacral pressure ulcer. Since admission. Her course is been very complicated with numerous complications including septic shock and GI bleeding. Now with failure to wean from vent and possible MI event, repeat ECHO EF 30-35, possible old MI, severe hypokinesis of the anterior and anteroseptal myocardium   SUBJ: Patient with acute shock, hypoxia on 1/13, required levophed, now levophed wean to 888mcg/hr.  Stable on vent this morning. Received HD yesterday, no fluid removed. Nodding head this morning, but not moving any extremities.    Summary of MAJOR EVENTS/TEST RESULTS: Admission 02/07/14-05/07/14 Admission 07/21/14-09/06/14 Discharged to [facility]. Pt had palliative consult at that time, were asked to sign off by husband.  09/23 CT head: NAD 09/23 EEG: no epileptiform activity 09/23 PRBCs for Hgb 6.4 09/24 bedside debridement of sacral wound. Abscess drained 09/25 Off vasopressors. More alert. No distress. Worsening thrombocytopenia. Vanc DC'd 09/29 Dr. [name] (I.D) excused from the case by patient's husband. 10/02 MRI -multiple infarcts 10/03 tracheal bleeding- transfused platelets 10/03 hospitalist service excused from the case by patient's husband 10/03 Echocardiogram ejection fraction was 55-60%, pulmonary systolic pressure was 39 mmHg 95/2810/08 restart TF's at lower rate, attempt reg HD  10/12 Transferred to med-surg floor.  Remains on PCCM service 10/14 SLP eval: pt unable to tolerate PMV adequately 10/17-will re-attempt PM valve-discussed with Speech therapist 10/18 passed swallow eval-start pureed thick foods no thin liquids-continue NG feeds 10/19 transferred to step down for sepsis/aspiration pneumonia 10/19 cxr shows RLL opacity 10/21 sacral decub debride at bedside by surgery 10/22 started back on vasopressors while on HD 10/27 CT with osteo, R hip fx, unable to identify tip of dubhoff tube - sent for fluoro study 10/28 Ortho consultation: I do not feel that she is a surgical candidate. Therefore, I feel that it would best to manage this fracture nonsurgically and allow it to heal by itself over time, which it should.  10/28 Gen Surg consultation: Due to the lack of free air or free flow of contrast and the peritoneum there is no indication for any surgical intervention on this. Would recommend pulling back the feeding tube 1-2 cm. Would recheck an abdominal film to confirm no pneumoperitoneum in the morning. Absence of any changes okay to continue using Dobbhoff for feeding and medications. 10/28 gastrograffin study: The study confirms that the feeding tube pes perforated through the duodenum and the tip is within a cavity that fills with injected contrast. The cavity does appear walled off 11/4 refuses oral feeds, continue TF's CT reviewed: T8-T9 discitis/osteomyelitis, RT HIP FRACTURE 11/5 placed back on vasopressors, placed back on Vent due to resp acidosis, levophed turned off 11/6 afternoon 11/5 PM Trach changed out due to cuff leak, #6 Shiley cuffed, 11/6 dubhoff occluded, removed and replaced, new dubhoff shows tube in the antral stomach 11/15 refusing to take oral feeds 11/18 placed back on Vent for increased WOB,SOB. 11/28 Wound care as re -consulted, there appears to be a new pocket/fistula around the area of her  stage IV sacral wound. 10/18/2015; the patient developed a new left basal pneumonia;  with recurrent sepsis.  10/19/2015; fevers resolved with IV acetaminophen. Tube feeds changed from vital to Jevity.  12/7 CXR with stable b/l opacities.  12/12 elevated K s/p HD-remains on AC mode 12/16-vomiting-hold feeds and re-assess in next 24 hrs 12/17- TF restarted at 1/2 goal (goal = 50cc/per) 12/18- elevated wbc, fever (101.4), recultured, restarted on ceftaz 12/19 Pericardial friction rub. Echocardiogram: LVEF 30-35%, anteroseptal hypokinesis or akinesis c/w anterior wall MI 12/20 Dr Sung Amabile discussed new echocardiogram findings with pt's husband and explained that this likely represents an anterior wall MI sometime in the previous couple of weeks. It was explained that she is not a candidate for any cardiology intervention. Code status was again discussed. Husband expressed understanding that she was not going to survive this illness in any favorable way but continues to request that she be full code status based on what he believes to be her wishes 12/21 vomitied 50cc of TF - AM TF held, restart TF at 3pm @30cc  12/26 Persistent intermittent trach cuff leak. Distal XLT trach requested.  12/27 15 beat NSVT 12/28 patient refused HD and refused Bath time 12/29 D10 infusion for hypoglycemia, TF's on hold due to intolerance 12/30 CODE BLUE  VTACH 01/01 transfuse 2 units of blood 01/02 increased respiratory distress, copious purulent secretions 01/02 LE venous US: No DVT 01/03 Repeat TTE: The cavity size was mildly dilated. Systolic function was moderately to severely reduced. The estimated ejection fraction was in the range of 30% to 35%. Diffuse hypokinesis. Moderate to severe hypokinesis of the anteroseptal, anterior, and apical myocardium. Hypokinesis of the anterior myocardium 01/05 Hypothermic - Bair Hugger ordered 01/10-placed back on vasopressors for septic shock 1/11-failed SBT due to resp muscle fatigue 1/13-worsening shock and hypoxia, agonal respirations  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 Pneumoniae, P. Mirabilis, VRE Wound 9/26 >> No growth Blood 9/23 >> NEG CDiff 9/27>>neg Stool Cx 10/15>> negative Cdiff 10/25>>neg Trach Aspirate 10/28>> light growth pseudomonas Blood 10/28 >> 1/2 GPC >>  Sputum cultures obtained 09/22/15 due to mucus plugging>>Pseudomonas BONE TISSUE Cx 11/3>>E.coli (ESBL), k. Pneumonia (daptomycin and septra) Bld Cx 11/27>>negative thus far.  Trach Asp 11/27>> grew out Pseudomonas, and Klebsiella pneumonia. Trach Asp 12/18>> MDR Klebsiella, Pseudomonas Bld Cx 12/18>> NEG 12/26 resp culture >> MDR pseudomonas  ANTIMICROBIALS:  Aztreonam 9/23 >> 9/24 Vanc 9/23 >> 9/25 Vanc 9/26>>9/27 Daptomycin 9/27>> 10/12, 10/28>> off Meropenem 9/23 >> 10/14, 10/28 >> 10/31 Zosyn 11/27,>> 11/30. Levaquin 11/29>> 11/29. Ceftaz 12/1>>12/11, 12/18>> 12/24 Colisitin 12/26 >>  Tigecycline 12/26 >>  Nebulized tobramycin 12/30 >>  VITAL SIGNS: Temp:  [97.4 F (36.3 C)-99.6 F (37.6 C)] 99.1 F (37.3 C) (01/14 0800) Pulse Rate:  [33-91] 86 (01/14 0845) Resp:  [10-37] 22 (01/14 0845) BP: (50-146)/(26-105) 120/52 mmHg (01/14 0845) SpO2:  [91 %-100 %] 94 % (01/14 0845) FiO2 (%):  [28 %-30 %] 30 % (01/14 0845) HEMODYNAMICS:   VENTILATOR SETTINGS: Vent Mode:  [-] PRVC FiO2 (%):  [28 %-30 %] 30 % Set Rate:  [14 bmp] 14 bmp Vt Set:  [500 mL] 500 mL PEEP:  [5 cmH20] 5 cmH20  Plateau Pressure:  [15 cmH20] 15 cmH20 INTAKE / OUTPUT:  Intake/Output Summary (Last 24 hours) at 12/03/15 0917 Last data filed at 12/03/15 0800  Gross per 24 hour  Intake 2151.88 ml  Output      0  ml  Net 2151.88 ml    Review of Systems  Unable to perform ROS: critical illness   Physical Exam  Constitutional:  Critical ill appearance  HENT:  Head: Normocephalic and atraumatic.  Right Ear: External ear normal.  Left Ear: External ear normal.  Eyes: Conjunctivae are normal. Pupils are equal, round, and reactive to light. Right eye exhibits no discharge. Left eye exhibits no discharge.  Neck: Neck supple. No tracheal deviation present. No thyromegaly present.  Cardiovascular: Regular rhythm, normal heart sounds and intact distal pulses.   No murmur heard. Pulmonary/Chest: She has no wheezes.  Tracheostomy - on MV  Abdominal: Soft. Bowel sounds are normal. She exhibits no distension.  Musculoskeletal: She exhibits edema.  Ext cool, trace symmetric edema, marked muscle wasting Severe sacral decubitus ulcer- to the spine  Skin: Skin is warm.  Nursing note and vitals reviewed.   RASS 0, intermittent trach tube cuff leak,vent graphics look good, no air leak  HEENT; Cushingoid facies, temporal wasting, NGT present. Tube feeds are infusing. Trach site clean Diffuse rhonchi Reg with occ extrasystoles, no M Abdomen soft, + BS Ext cool, trace symmetric edema, marked muscle wasting Severe sacral decubitus ulcer- to the spine  LABS:  CBC  Recent Labs Lab 11/30/15 1633 12/02/15 1609 12/03/15 0552  WBC 13.3* 13.5* 15.5*  HGB 8.9* 8.8* 8.5*  HCT 29.7* 30.4* 28.0*  PLT 68* 58* 58*   Coag's No results for input(s): APTT, INR in the last 168 hours. BMET  Recent Labs Lab 11/28/15 1348 11/30/15 1633 12/02/15 1609  NA 129* 129* 132*  K 3.8 3.9 3.7  CL 95* 97* 96*  CO2 26 27 29   BUN 102* 95* 95*  CREATININE 1.45* 1.38* 1.34*  GLUCOSE 109* 140* 186*   Electrolytes  Recent Labs Lab 11/28/15 1348 11/30/15 1633 12/02/15 1609  CALCIUM 8.0* 8.0* 8.2*  PHOS 6.1*  --  5.6*   Sepsis Markers No results for input(s): LATICACIDVEN, PROCALCITON, O2SATVEN in the last  168 hours. ABG No results for input(s): PHART, PCO2ART, PO2ART in the last 168 hours. Liver Enzymes  Recent Labs Lab 11/28/15 1348 12/02/15 1609  ALBUMIN 1.6* 1.6*   Cardiac Enzymes No results for input(s): TROPONINI, PROBNP in the last 168 hours. Glucose  Recent Labs Lab 12/01/15 1603 12/01/15 2334 12/02/15 0729 12/02/15 1552 12/02/15 2339 12/03/15 0752  GLUCAP 132* 150* 134* 172* 117* 106*    Imaging No results found.  ASSESSMENT / PLAN: IMPRESSION: Very prolonged and complicated hospitalization after gastric bypass surgery 2014 Never returned home ESRD-hd as tolerated Recurrent severe sepsis - PAN resistant Klebisella in sputum Recurrent PNA-now with pseudomonas H/O C diff H/O VTE DM2 - controlled Episodic hypoglycemia Chronic steroid therapy, for a history of of adrenal insufficiency Incidental finding of R hip fx - conservative mgmt. 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 Husband declined TEE Profound deconditioning Chronic pain New finding of pericardial friction rub 12/19 - resolved New finding of cardiomyopathy, likely post MI 12/19  PAF/flutter Sinus bradycardia HCAP with MDR Donalee Citrin and pseudomonas Chronic anemia Severe protein - calorie malnutrition TF intolerance Mild thrombocytopenia 01/06  Cont vent support - settings reviewed and/or adjusted Cont vent bundle Cont HD per renal Monitor BMET intermittently Monitor I/Os Correct electrolytes as indicated DVT px: SQ heparin Monitor CBC intermittently Transfuse per usual guidelines Monitor temp, WBC count Micro and abx as above ID following - appreciate recs Cont Amiodarone - 100  mg per tube daily DC metoprolol Cont opiates PRN Cont D10 Infusion for hypoglycemia TFs @  30 cc/hr Vasopressors to keep MAP>60   Left VM for husband via telephone.  Prognosis is very poor without hope for functional recovery. Patient is dialysis and Vent dependent with severe sacral decub ulcer with chronic bone infection and spinal infection with underlying CHF and CVA with recurrent pneumonia.   This patient in my opinion is deemed Futile. Husband understands but unrealistic of goals of care  Patient with intermittent delirium with chronic sepsis and can not make decisions for herself.   Would recommend DNR status and Comfort care measures  I have personally obtained a history, examined the patient, evaluated Pertinent laboratory and RadioGraphic/imaging results, and  formulated the assessment and plan   The Patient requires high complexity decision making for assessment and support, frequent evaluation and titration of therapies, application of advanced monitoring technologies and extensive interpretation of multiple databases. Critical Care Time devoted to patient care services described in this note is 35 minutes.   Overall, patient is critically ill, prognosis is guarded.  Patient with Multiorgan failure and at high risk for cardiac arrest and death.    Stephanie Acre, MD [facility] Pager [phone] (please enter 7-digits) On Call Pager - [phone] (please enter 7-digits)

## 2015-12-03 NOTE — Progress Notes (Signed)
ANTIBIOTIC CONSULT NOTE - follow Up  Pharmacy Consult for Colistin and Tigecycline  Indication: MDR Respiratory Infection   Allergies  Allergen Reactions  . Contrast Media [Iodinated Diagnostic Agents] Anaphylaxis  . Ampicillin Rash   Patient Measurements: Height:  (SCALE BROKE) Weight:  (No bedscale) IBW/kg (Calculated) : 66.2  Vital Signs: Temp: 98.9 F (37.2 C) (01/14 1200) Temp Source: Axillary (01/14 1200) BP: 114/47 mmHg (01/14 1315) Pulse Rate: 84 (01/14 1315) Intake/Output from previous day: 01/13 0701 - 01/14 0700 In: 2269.4 [I.V.:792.7; NG/GT:1070; IV Piggyback:406.7] Out: 0  Intake/Output from this shift: Total I/O In: 674.4 [I.V.:214.4; NG/GT:460] Out: -   Recent Labs  11/30/15 1633 12/02/15 1609 12/03/15 0552  WBC 13.3* 13.5* 15.5*  HGB 8.9* 8.8* 8.5*  PLT 68* 58* 58*  CREATININE 1.38* 1.34*  --    Estimated Creatinine Clearance: 63.8 mL/min (by C-G formula based on Cr of 1.34). No results for input(s): VANCOTROUGH, VANCOPEAK, VANCORANDOM, GENTTROUGH, GENTPEAK, GENTRANDOM, TOBRATROUGH, TOBRAPEAK, TOBRARND, AMIKACINPEAK, AMIKACINTROU, AMIKACIN in the last 72 hours.   Microbiology: Recent Results (from the past 720 hour(s))  Culture, blood (Routine X 2) w Reflex to ID Panel     Status: None   Collection Time: 11/06/15  8:27 AM  Result Value Ref Range Status   Specimen Description BLOOD RIGHT HAND  Final   Special Requests BOTTLES DRAWN AEROBIC AND ANAEROBIC 3CC  Final   Culture NO GROWTH 5 DAYS  Final   Report Status 11/11/2015 FINAL  Final  Culture, blood (Routine X 2) w Reflex to ID Panel     Status: None   Collection Time: 11/06/15  8:29 AM  Result Value Ref Range Status   Specimen Description BLOOD LEFT HAND  Final   Special Requests BOTTLES DRAWN AEROBIC AND ANAEROBIC  1CC  Final   Culture NO GROWTH 5 DAYS  Final   Report Status 11/11/2015 FINAL  Final  Culture, expectorated sputum-assessment     Status: None   Collection Time: 11/06/15   8:41 AM  Result Value Ref Range Status   Specimen Description EXPECTORATED SPUTUM  Final   Special Requests Immunocompromised  Final   Sputum evaluation THIS SPECIMEN IS ACCEPTABLE FOR SPUTUM CULTURE  Final   Report Status 11/06/2015 FINAL  Final  Culture, respiratory (NON-Expectorated)     Status: None   Collection Time: 11/06/15  8:41 AM  Result Value Ref Range Status   Specimen Description EXPECTORATED SPUTUM  Final   Special Requests Immunocompromised Reflexed from [id number]  Final   Gram Stain   Final    MANY WBC SEEN MANY GRAM NEGATIVE RODS EXCELLENT SPECIMEN - 90-100% WBCS    Culture   Final    MODERATE GROWTH KLEBSIELLA PNEUMONIAE MODERATE GROWTH PSEUDOMONAS AERUGINOSA CRITICAL RESULT CALLED TO, READ BACK BY AND VERIFIED WITH: DR. FITZGERALD AT 1120 11/09/15 DV DR. FITZGERALD REQUESTED ORGANISM BE SENT OUT FOR TIGECYCLINE CEFT/AVIBACTAM AND COLISTIN Sent to [facility] for further susceptibility testing. 11/11/15 CTJ    Report Status 11/15/2015 FINAL  Final   Organism ID, Bacteria KLEBSIELLA PNEUMONIAE  Final   Organism ID, Bacteria PSEUDOMONAS AERUGINOSA  Final      Susceptibility   Klebsiella pneumoniae - MIC*    AMPICILLIN >=32 RESISTANT Resistant     CEFAZOLIN >=64 RESISTANT Resistant     CEFTRIAXONE 32 RESISTANT Resistant     CIPROFLOXACIN >=4 RESISTANT Resistant     GENTAMICIN >=16 RESISTANT Resistant     IMIPENEM >=16 RESISTANT Resistant     NITROFURANTOIN 256 RESISTANT Resistant  TRIMETH/SULFA >=320 RESISTANT Resistant     Extended ESBL POSITIVE Resistant     * MODERATE GROWTH KLEBSIELLA PNEUMONIAE   Pseudomonas aeruginosa - MIC*    CEFTAZIDIME >=64 RESISTANT Resistant     CIPROFLOXACIN 2 INTERMEDIATE Intermediate     GENTAMICIN >=16 RESISTANT Resistant     IMIPENEM >=16 RESISTANT Resistant     * MODERATE GROWTH PSEUDOMONAS AERUGINOSA  Culture, respiratory (NON-Expectorated)     Status: None   Collection Time: 11/14/15  2:07 PM  Result Value Ref Range Status    Specimen Description TRACHEAL ASPIRATE  Final   Special Requests NONE  Final   Gram Stain   Final    EXCELLENT SPECIMEN - 90-100% WBCS MANY WBC SEEN MODERATE GRAM NEGATIVE RODS    Culture   Final    HEAVY GROWTH PSEUDOMONAS AERUGINOSA CRITICAL VALUE NOTED.  VALUE IS CONSISTENT WITH PREVIOUSLY REPORTED AND CALLED VALUE.    Report Status 11/16/2015 FINAL  Final   Organism ID, Bacteria PSEUDOMONAS AERUGINOSA  Final      Susceptibility   Pseudomonas aeruginosa - MIC*    CEFTAZIDIME >=64 RESISTANT Resistant     CIPROFLOXACIN 2 INTERMEDIATE Intermediate     GENTAMICIN >=16 RESISTANT Resistant     IMIPENEM >=16 RESISTANT Resistant     PIP/TAZO Value in next row Resistant      RESISTANT>=128    TOBRAMYCIN Value in next row Sensitive      SENSITIVE2    * HEAVY GROWTH PSEUDOMONAS AERUGINOSA    Medical History: Past Medical History  Diagnosis Date  . Obesity   . Dyslipidemia   . Hypertension   . Coronary artery disease     s/p BMS 2010 LAD  . Dysrhythmia     ventricular tachycardia resolved after LAD stent and beta blocker  . Diabetes mellitus     with retinopathy, neuropathy and microalbuminemia  . 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 Patient is on day 113 of hospital stay, and has been treated for multiple infections during this time.   Plan:  Per microbiology, Klebsiella is sensitive to Tygacil. Sensitivity results for Avycaz and colistin are still pending.   Continue tigecycline 50mg  IV q12h.   Continue  colistimethate 250mg  (~2.5 mg/kg) IV q48 hours.   Patient should be monitored for nephrotoxicity and neurotoxicity.  Pharmacy to continue to follow per consult.      Bari MantisKristin Trelon Plush, PharmD 12/03/2015 4:16 PM

## 2015-12-03 NOTE — Progress Notes (Signed)
MEDICATION RELATED CONSULT NOTE - Follow up   Pharmacy Consult for Renal dosing  Allergies  Allergen Reactions  . Contrast Media [Iodinated Diagnostic Agents] Anaphylaxis  . Ampicillin Rash   Patient Measurements: Ht 265ft 9in Height:  (SCALE BROKE) Weight:  (No bedscale) IBW/kg (Calculated) : 66.2  Vital Signs: Temp: 98.9 F (37.2 C) (01/14 1200) Temp Source: Axillary (01/14 1200) BP: 114/47 mmHg (01/14 1315) Pulse Rate: 84 (01/14 1315)  Recent Labs  11/30/15 1633 12/02/15 1609 12/03/15 0552  WBC 13.3* 13.5* 15.5*  HGB 8.9* 8.8* 8.5*  HCT 29.7* 30.4* 28.0*  PLT 68* 58* 58*  CREATININE 1.38* 1.34*  --   PHOS  --  5.6*  --   ALBUMIN  --  1.6*  --    Estimated Creatinine Clearance: 63.8 mL/min (by C-G formula based on Cr of 1.34).  Assessment: 50 yo patient on HD MWF. Pharmacy consulted for renal dosing of medications.   Plan:  No medications require adjustment at present. Pharmacy will continue to monitor and dose adjust as needed.    Bari MantisKristin Arleatha Philipps PharmD Clinical Pharmacist 12/03/2015

## 2015-12-04 LAB — GLUCOSE, CAPILLARY
GLUCOSE-CAPILLARY: 123 mg/dL — AB (ref 65–99)
Glucose-Capillary: 145 mg/dL — ABNORMAL HIGH (ref 65–99)
Glucose-Capillary: 151 mg/dL — ABNORMAL HIGH (ref 65–99)

## 2015-12-04 MED ORDER — ALBUMIN HUMAN 25 % IV SOLN
12.5000 g | Freq: Once | INTRAVENOUS | Status: AC
  Administered 2015-12-04: 12.5 g via INTRAVENOUS
  Filled 2015-12-04: qty 50

## 2015-12-04 NOTE — Progress Notes (Signed)
ANTIBIOTIC CONSULT NOTE - follow Up  Pharmacy Consult for Colistin and Tigecycline  Indication: MDR Respiratory Infection   Allergies  Allergen Reactions  . Contrast Media [Iodinated Diagnostic Agents] Anaphylaxis  . Ampicillin Rash   Patient Measurements: Height:  (SCALE BROKE) Weight:  (No bedscale) IBW/kg (Calculated) : 66.2  Vital Signs: Temp: 97.6 F (36.4 C) (01/15 1600) Temp Source: Axillary (01/15 1230) BP: 112/47 mmHg (01/15 1630) Pulse Rate: 37 (01/15 1630) Intake/Output from previous day: 01/14 0701 - 01/15 0700 In: 2380.5 [I.V.:810.5; NG/GT:1370; IV Piggyback:200] Out: -  Intake/Output from this shift: Total I/O In: 504.3 [I.V.:34.3; NG/GT:470] Out: -   Recent Labs  12/02/15 1609 12/03/15 0552  WBC 13.5* 15.5*  HGB 8.8* 8.5*  PLT 58* 58*  CREATININE 1.34*  --    Estimated Creatinine Clearance: 63.8 mL/min (by C-G formula based on Cr of 1.34). No results for input(s): VANCOTROUGH, VANCOPEAK, VANCORANDOM, GENTTROUGH, GENTPEAK, GENTRANDOM, TOBRATROUGH, TOBRAPEAK, TOBRARND, AMIKACINPEAK, AMIKACINTROU, AMIKACIN in the last 72 hours.   Microbiology: Recent Results (from the past 720 hour(s))  Culture, blood (Routine X 2) w Reflex to ID Panel     Status: None   Collection Time: 11/06/15  8:27 AM  Result Value Ref Range Status   Specimen Description BLOOD RIGHT HAND  Final   Special Requests BOTTLES DRAWN AEROBIC AND ANAEROBIC 3CC  Final   Culture NO GROWTH 5 DAYS  Final   Report Status 11/11/2015 FINAL  Final  Culture, blood (Routine X 2) w Reflex to ID Panel     Status: None   Collection Time: 11/06/15  8:29 AM  Result Value Ref Range Status   Specimen Description BLOOD LEFT HAND  Final   Special Requests BOTTLES DRAWN AEROBIC AND ANAEROBIC  1CC  Final   Culture NO GROWTH 5 DAYS  Final   Report Status 11/11/2015 FINAL  Final  Culture, expectorated sputum-assessment     Status: None   Collection Time: 11/06/15  8:41 AM  Result Value Ref Range Status   Specimen Description EXPECTORATED SPUTUM  Final   Special Requests Immunocompromised  Final   Sputum evaluation THIS SPECIMEN IS ACCEPTABLE FOR SPUTUM CULTURE  Final   Report Status 11/06/2015 FINAL  Final  Culture, respiratory (NON-Expectorated)     Status: None   Collection Time: 11/06/15  8:41 AM  Result Value Ref Range Status   Specimen Description EXPECTORATED SPUTUM  Final   Special Requests Immunocompromised Reflexed from [id number]  Final   Gram Stain   Final    MANY WBC SEEN MANY GRAM NEGATIVE RODS EXCELLENT SPECIMEN - 90-100% WBCS    Culture   Final    MODERATE GROWTH KLEBSIELLA PNEUMONIAE MODERATE GROWTH PSEUDOMONAS AERUGINOSA CRITICAL RESULT CALLED TO, READ BACK BY AND VERIFIED WITH: DR. FITZGERALD AT 1120 11/09/15 DV DR. FITZGERALD REQUESTED ORGANISM BE SENT OUT FOR TIGECYCLINE CEFT/AVIBACTAM AND COLISTIN Sent to [facility] for further susceptibility testing. 11/11/15 CTJ    Report Status 11/15/2015 FINAL  Final   Organism ID, Bacteria KLEBSIELLA PNEUMONIAE  Final   Organism ID, Bacteria PSEUDOMONAS AERUGINOSA  Final      Susceptibility   Klebsiella pneumoniae - MIC*    AMPICILLIN >=32 RESISTANT Resistant     CEFAZOLIN >=64 RESISTANT Resistant     CEFTRIAXONE 32 RESISTANT Resistant     CIPROFLOXACIN >=4 RESISTANT Resistant     GENTAMICIN >=16 RESISTANT Resistant     IMIPENEM >=16 RESISTANT Resistant     NITROFURANTOIN 256 RESISTANT Resistant     TRIMETH/SULFA >=320 RESISTANT Resistant  Extended ESBL POSITIVE Resistant     * MODERATE GROWTH KLEBSIELLA PNEUMONIAE   Pseudomonas aeruginosa - MIC*    CEFTAZIDIME >=64 RESISTANT Resistant     CIPROFLOXACIN 2 INTERMEDIATE Intermediate     GENTAMICIN >=16 RESISTANT Resistant     IMIPENEM >=16 RESISTANT Resistant     * MODERATE GROWTH PSEUDOMONAS AERUGINOSA  Culture, respiratory (NON-Expectorated)     Status: None   Collection Time: 11/14/15  2:07 PM  Result Value Ref Range Status   Specimen Description TRACHEAL ASPIRATE   Final   Special Requests NONE  Final   Gram Stain   Final    EXCELLENT SPECIMEN - 90-100% WBCS MANY WBC SEEN MODERATE GRAM NEGATIVE RODS    Culture   Final    HEAVY GROWTH PSEUDOMONAS AERUGINOSA CRITICAL VALUE NOTED.  VALUE IS CONSISTENT WITH PREVIOUSLY REPORTED AND CALLED VALUE.    Report Status 11/16/2015 FINAL  Final   Organism ID, Bacteria PSEUDOMONAS AERUGINOSA  Final      Susceptibility   Pseudomonas aeruginosa - MIC*    CEFTAZIDIME >=64 RESISTANT Resistant     CIPROFLOXACIN 2 INTERMEDIATE Intermediate     GENTAMICIN >=16 RESISTANT Resistant     IMIPENEM >=16 RESISTANT Resistant     PIP/TAZO Value in next row Resistant      RESISTANT>=128    TOBRAMYCIN Value in next row Sensitive      SENSITIVE2    * HEAVY GROWTH PSEUDOMONAS AERUGINOSA    Medical History: Past Medical History  Diagnosis Date  . Obesity   . Dyslipidemia   . Hypertension   . Coronary artery disease     s/p BMS 2010 LAD  . Dysrhythmia     ventricular tachycardia resolved after LAD stent and beta blocker  . Diabetes mellitus     with retinopathy, neuropathy and microalbuminemia  . 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 Patient is on day 113 of hospital stay, and has been treated for multiple infections during this time.   Plan:  Per microbiology, Klebsiella is sensitive to Tygacil. Sensitivity results for Avycaz and colistin are still pending.   Continue tigecycline 50mg  IV q12h.   Continue  colistimethate 250mg  (~2.5 mg/kg) IV q48 hours.   Patient should be monitored for nephrotoxicity and neurotoxicity.  Pharmacy to continue to follow per consult.      Bari Mantis, PharmD 12/04/2015 5:14 PM

## 2015-12-04 NOTE — Progress Notes (Signed)
Subjective:  Patient remains critically ill, on pressors and ventilator dependent did not follow commands BP remains borderline low; iv levophed 2000 cc removed with HD on Monday, 1500 on Wednesday Net UF 0 on Friday due to hypotension during dialysis    Objective:  Vital signs in last 24 hours:  Temp:  [97.7 F (36.5 C)-98.9 F (37.2 C)] 97.7 F (36.5 C) (01/15 0815) Pulse Rate:  [38-87] 38 (01/15 1115) Resp:  [0-41] 15 (01/15 1115) BP: (86-145)/(35-80) 122/69 mmHg (01/15 1115) SpO2:  [90 %-100 %] 98 % (01/15 1115) FiO2 (%):  [30 %-40 %] 35 % (01/15 1115)  Weight change:  Filed Weights   11/21/15 0500 11/21/15 1540 11/22/15 0500  Weight: 107 kg (235 lb 14.3 oz) 107 kg (235 lb 14.3 oz) 102 kg (224 lb 13.9 oz)    Intake/Output: I/O last 3 completed shifts: In: 3719.3 [I.V.:1242.6; NG/GT:1870; IV Piggyback:606.7] Out: 0    Intake/Output this shift:  Total I/O In: 318.6 [I.V.:18.6; NG/GT:300] Out: -   Physical Exam: General: critically ill   Head/ENT: Moist oral mucosal membrane, +NG tube  Eyes: Anicteric, eyes closed  Neck: Tracheostomy in place  Lungs:  vent assisted , diffuse crackles,   Heart: Regular with ectopic beats  Abdomen:  Soft,    Extremities:   anasarca  Neurologic: Resting comfortably,  Did not respond to verbal stimulus   Access:  R IJ permcath.       Basic Metabolic Panel:  Recent Labs Lab 11/28/15 1348 11/30/15 1633 12/02/15 1609  NA 129* 129* 132*  K 3.8 3.9 3.7  CL 95* 97* 96*  CO2 26 27 29   GLUCOSE 109* 140* 186*  BUN 102* 95* 95*  CREATININE 1.45* 1.38* 1.34*  CALCIUM 8.0* 8.0* 8.2*  PHOS 6.1*  --  5.6*    Liver Function Tests:  Recent Labs Lab 11/28/15 1348 12/02/15 1609  ALBUMIN 1.6* 1.6*   No results for input(s): LIPASE, AMYLASE in the last 168 hours. No results for input(s): AMMONIA in the last 168 hours.  CBC:  Recent Labs Lab 11/28/15 0458 11/28/15 1348 11/30/15 1633 12/02/15 1609 12/03/15 0552  WBC  15.6* 19.6* 13.3* 13.5* 15.5*  NEUTROABS  --   --  11.4*  --   --   HGB 9.1* 9.0* 8.9* 8.8* 8.5*  HCT 29.8* 29.6* 29.7* 30.4* 28.0*  MCV 93.8 94.8 97.5 98.8 98.0  PLT 94* 81* 68* 58* 58*    Cardiac Enzymes: No results for input(s): CKTOTAL, CKMB, CKMBINDEX, TROPONINI in the last 168 hours.  BNP: Invalid input(s): POCBNP  CBG:  Recent Labs Lab 12/02/15 2339 12/03/15 0752 12/03/15 1604 12/03/15 2340 12/04/15 0740  GLUCAP 117* 106* 104* 146* 145*    Microbiology: Results for orders placed or performed during the hospital encounter of 08/07/2015  Blood Culture (routine x 2)     Status: None   Collection Time: 08/11/2015  8:51 AM  Result Value Ref Range Status   Specimen Description BLOOD [name]  Final   Special Requests BOTTLES DRAWN AEROBIC AND ANAEROBIC  3CC  Final   Culture NO GROWTH 5 DAYS  Final   Report Status 08/17/2015 FINAL  Final  Blood Culture (routine x 2)     Status: None   Collection Time: 08/04/2015  9:20 AM  Result Value Ref Range Status   Specimen Description BLOOD LEFT ARM  Final   Special Requests BOTTLES DRAWN AEROBIC AND ANAEROBIC  1CC  Final   Culture NO GROWTH 5 DAYS  Final  Report Status 08/17/2015 FINAL  Final  Wound culture     Status: None   Collection Time: 2015/03/31  9:20 AM  Result Value Ref Range Status   Specimen Description DECUBITIS  Final   Special Requests Normal  Final   Gram Stain   Final    FEW WBC SEEN MANY GRAM NEGATIVE RODS RARE GRAM POSITIVE COCCI    Culture   Final    HEAVY GROWTH ESCHERICHIA COLI MODERATE GROWTH ENTEROBACTER AEROGENES PROTEUS MIRABILIS HEAVY GROWTH ENTEROCOCCUS SPECIES VRE HAVE INTRINSIC RESISTANCE TO MOST COMMONLY USED ANTIBIOTICS AND THE ABILITY TO ACQUIRE RESISTANCE TO MOST AVAILABLE ANTIBIOTICS.    Report Status 08/16/2015 FINAL  Final   Organism ID, Bacteria ESCHERICHIA COLI  Final   Organism ID, Bacteria ENTEROBACTER AEROGENES  Final   Organism ID, Bacteria PROTEUS MIRABILIS  Final   Organism ID,  Bacteria ENTEROCOCCUS SPECIES  Final      Susceptibility   Enterobacter aerogenes - MIC*    CEFTAZIDIME <=1 SENSITIVE Sensitive     CEFAZOLIN >=64 RESISTANT Resistant     CEFTRIAXONE <=1 SENSITIVE Sensitive     CIPROFLOXACIN <=0.25 SENSITIVE Sensitive     GENTAMICIN <=1 SENSITIVE Sensitive     IMIPENEM 1 SENSITIVE Sensitive     TRIMETH/SULFA <=20 SENSITIVE Sensitive     * MODERATE GROWTH ENTEROBACTER AEROGENES   Escherichia coli - MIC*    AMPICILLIN <=2 SENSITIVE Sensitive     CEFTAZIDIME <=1 SENSITIVE Sensitive     CEFAZOLIN <=4 SENSITIVE Sensitive     CEFTRIAXONE <=1 SENSITIVE Sensitive     CIPROFLOXACIN <=0.25 SENSITIVE Sensitive     GENTAMICIN <=1 SENSITIVE Sensitive     IMIPENEM <=0.25 SENSITIVE Sensitive     TRIMETH/SULFA <=20 SENSITIVE Sensitive     * HEAVY GROWTH ESCHERICHIA COLI   Proteus mirabilis - MIC*    AMPICILLIN >=32 RESISTANT Resistant     CEFTAZIDIME <=1 SENSITIVE Sensitive     CEFAZOLIN 8 SENSITIVE Sensitive     CEFTRIAXONE <=1 SENSITIVE Sensitive     CIPROFLOXACIN <=0.25 SENSITIVE Sensitive     GENTAMICIN <=1 SENSITIVE Sensitive     IMIPENEM 1 SENSITIVE Sensitive     TRIMETH/SULFA <=20 SENSITIVE Sensitive     * PROTEUS MIRABILIS   Enterococcus species - MIC*    AMPICILLIN >=32 RESISTANT Resistant     VANCOMYCIN >=32 RESISTANT Resistant     GENTAMICIN SYNERGY SENSITIVE Sensitive     TETRACYCLINE Value in next row Resistant      RESISTANT>=16    * HEAVY GROWTH ENTEROCOCCUS SPECIES  MRSA PCR Screening     Status: None   Collection Time: 2015/03/31  2:38 PM  Result Value Ref Range Status   MRSA by PCR NEGATIVE NEGATIVE Final    Comment:        The GeneXpert MRSA Assay (FDA approved for NASAL specimens only), is one component of a comprehensive MRSA colonization surveillance program. It is not intended to diagnose MRSA infection nor to guide or monitor treatment for MRSA infections.   Blood culture (single)     Status: None   Collection Time:  2015/03/31  3:36 PM  Result Value Ref Range Status   Specimen Description BLOOD RIGHT ASSIST CONTROL  Final   Special Requests BOTTLES DRAWN AEROBIC AND ANAEROBIC 5ML  Final   Culture NO GROWTH 5 DAYS  Final   Report Status 08/17/2015 FINAL  Final  Wound culture     Status: None   Collection Time: 08/13/15 12:37 PM  Result Value  Ref Range Status   Specimen Description WOUND  Final   Special Requests Normal  Final   Gram Stain   Final    FEW WBC SEEN TOO NUMEROUS TO COUNT GRAM NEGATIVE RODS FEW GRAM POSITIVE COCCI    Culture   Final    HEAVY GROWTH ESCHERICHIA COLI MODERATE GROWTH PROTEUS MIRABILIS LIGHT GROWTH KLEBSIELLA PNEUMONIAE MODERATE GROWTH ENTEROCOCCUS GALLINARUM CRITICAL RESULT CALLED TO, READ BACK BY AND VERIFIED WITH: [facility] BORBA AT 1042 08/16/15 DV    Report Status 08/17/2015 FINAL  Final   Organism ID, Bacteria ESCHERICHIA COLI  Final   Organism ID, Bacteria PROTEUS MIRABILIS  Final   Organism ID, Bacteria KLEBSIELLA PNEUMONIAE  Final   Organism ID, Bacteria ENTEROCOCCUS GALLINARUM  Final      Susceptibility   Escherichia coli - MIC*    AMPICILLIN >=32 RESISTANT Resistant     CEFTAZIDIME 4 RESISTANT Resistant     CEFAZOLIN >=64 RESISTANT Resistant     CEFTRIAXONE 16 RESISTANT Resistant     GENTAMICIN 2 SENSITIVE Sensitive     IMIPENEM >=16 RESISTANT Resistant     TRIMETH/SULFA <=20 SENSITIVE Sensitive     Extended ESBL POSITIVE Resistant     PIP/TAZO Value in next row Resistant      RESISTANT>=128    CIPROFLOXACIN Value in next row Sensitive      SENSITIVE<=0.25    * HEAVY GROWTH ESCHERICHIA COLI   Klebsiella pneumoniae - MIC*    AMPICILLIN Value in next row Resistant      SENSITIVE<=0.25    CEFTAZIDIME Value in next row Resistant      SENSITIVE<=0.25    CEFAZOLIN Value in next row Resistant      SENSITIVE<=0.25    CEFTRIAXONE Value in next row Resistant      SENSITIVE<=0.25    CIPROFLOXACIN Value in next row Resistant      SENSITIVE<=0.25     GENTAMICIN Value in next row Sensitive      SENSITIVE<=0.25    IMIPENEM Value in next row Resistant      SENSITIVE<=0.25    TRIMETH/SULFA Value in next row Resistant      SENSITIVE<=0.25    PIP/TAZO Value in next row Resistant      RESISTANT>=128    * LIGHT GROWTH KLEBSIELLA PNEUMONIAE   Proteus mirabilis - MIC*    AMPICILLIN Value in next row Resistant      RESISTANT>=128    CEFTAZIDIME Value in next row Sensitive      RESISTANT>=128    CEFAZOLIN Value in next row Sensitive      RESISTANT>=128    CEFTRIAXONE Value in next row Sensitive      RESISTANT>=128    CIPROFLOXACIN Value in next row Sensitive      RESISTANT>=128    GENTAMICIN Value in next row Sensitive      RESISTANT>=128    IMIPENEM Value in next row Sensitive      RESISTANT>=128    TRIMETH/SULFA Value in next row Sensitive      RESISTANT>=128    PIP/TAZO Value in next row Sensitive      SENSITIVE<=4    * MODERATE GROWTH PROTEUS MIRABILIS   Enterococcus gallinarum - MIC*    AMPICILLIN Value in next row Resistant      SENSITIVE<=4    GENTAMICIN SYNERGY Value in next row Sensitive      SENSITIVE<=4    CIPROFLOXACIN Value in next row Resistant      RESISTANT>=8    TETRACYCLINE Value in next  row Resistant      RESISTANT>=16    * MODERATE GROWTH ENTEROCOCCUS GALLINARUM  Wound culture     Status: None   Collection Time: 08/15/15  2:53 PM  Result Value Ref Range Status   Specimen Description WOUND  Final   Special Requests NONE  Final   Gram Stain FEW WBC SEEN NO ORGANISMS SEEN   Final   Culture NO GROWTH 3 DAYS  Final   Report Status 08/18/2015 FINAL  Final  C difficile quick scan w PCR reflex     Status: None   Collection Time: 08/17/15 11:34 AM  Result Value Ref Range Status   C Diff antigen NEGATIVE NEGATIVE Final   C Diff toxin NEGATIVE NEGATIVE Final   C Diff interpretation Negative for C. difficile  Final  Stool culture     Status: None   Collection Time: 09/03/15  3:51 PM  Result Value Ref Range  Status   Specimen Description STOOL  Final   Special Requests Immunocompromised  Final   Culture   Final    NO SALMONELLA OR SHIGELLA ISOLATED No Pathogenic E. coli detected NO CAMPYLOBACTER DETECTED    Report Status 09/07/2015 FINAL  Final  C difficile quick scan w PCR reflex     Status: None   Collection Time: 09/13/15 12:51 AM  Result Value Ref Range Status   C Diff antigen NEGATIVE NEGATIVE Final   C Diff toxin NEGATIVE NEGATIVE Final   C Diff interpretation Negative for C. difficile  Final  Culture, blood (routine x 2)     Status: None   Collection Time: 09/16/15 11:24 AM  Result Value Ref Range Status   Specimen Description BLOOD LEFT HAND  Final   Special Requests BOTTLES DRAWN AEROBIC AND ANAEROBIC  1CC  Final   Culture NO GROWTH 5 DAYS  Final   Report Status 09/21/2015 FINAL  Final  Culture, blood (routine x 2)     Status: None   Collection Time: 09/16/15 12:23 PM  Result Value Ref Range Status   Specimen Description BLOOD RIGHT HAND  Final   Special Requests BOTTLES DRAWN AEROBIC AND ANAEROBIC  1CC  Final   Culture  Setup Time   Final    GRAM POSITIVE COCCI AEROBIC BOTTLE ONLY CRITICAL RESULT CALLED TO, READ BACK BY AND VERIFIED WITH: TESS THOMAS,RN 09/17/2015 0631 BY JRS.    Culture   Final    ENTEROCOCCUS FAECALIS AEROBIC BOTTLE ONLY VRE HAVE INTRINSIC RESISTANCE TO MOST COMMONLY USED ANTIBIOTICS AND THE ABILITY TO ACQUIRE RESISTANCE TO MOST AVAILABLE ANTIBIOTICS. CRITICAL RESULT CALLED TO, READ BACK BY AND VERIFIED WITH: CHERYL SMITH AT 1610 09/19/15 DV    Report Status 09/21/2015 FINAL  Final   Organism ID, Bacteria ENTEROCOCCUS FAECALIS  Final      Susceptibility   Enterococcus faecalis - MIC*    AMPICILLIN <=2 SENSITIVE Sensitive     LINEZOLID 2 SENSITIVE Sensitive     CIPROFLOXACIN Value in next row Resistant      RESISTANT>=8    TETRACYCLINE Value in next row Resistant      RESISTANT>=16    VANCOMYCIN Value in next row Resistant      RESISTANT>=32     GENTAMICIN SYNERGY Value in next row Resistant      RESISTANT>=32    * ENTEROCOCCUS FAECALIS  Culture, respiratory (NON-Expectorated)     Status: None   Collection Time: 09/16/15  3:50 PM  Result Value Ref Range Status   Specimen Description TRACHEAL ASPIRATE  Final   Special Requests Immunocompromised  Final   Gram Stain   Final    FEW WBC SEEN GOOD SPECIMEN - 80-90% WBCS RARE GRAM NEGATIVE RODS    Culture LIGHT GROWTH PSEUDOMONAS AERUGINOSA  Final   Report Status 09/19/2015 FINAL  Final   Organism ID, Bacteria PSEUDOMONAS AERUGINOSA  Final      Susceptibility   Pseudomonas aeruginosa - MIC*    CEFTAZIDIME 8 SENSITIVE Sensitive     CIPROFLOXACIN 2 INTERMEDIATE Intermediate     GENTAMICIN >=16 RESISTANT Resistant     IMIPENEM >=16 RESISTANT Resistant     PIP/TAZO Value in next row Sensitive      SENSITIVE32    CEFEPIME Value in next row Sensitive      SENSITIVE8    LEVOFLOXACIN Value in next row Resistant      RESISTANT>=8    * LIGHT GROWTH PSEUDOMONAS AERUGINOSA  Culture, expectorated sputum-assessment     Status: None   Collection Time: 09/22/15  2:09 PM  Result Value Ref Range Status   Specimen Description ENDOTRACHEAL  Final   Special Requests Normal  Final   Sputum evaluation THIS SPECIMEN IS ACCEPTABLE FOR SPUTUM CULTURE  Final   Report Status 09/24/2015 FINAL  Final  Culture, respiratory (NON-Expectorated)     Status: None   Collection Time: 09/22/15  2:09 PM  Result Value Ref Range Status   Specimen Description ENDOTRACHEAL  Final   Special Requests Normal Reflexed from [id number]  Final   Gram Stain   Final    FEW WBC SEEN FEW GRAM NEGATIVE RODS POOR SPECIMEN - LESS THAN 70% WBCS    Culture   Final    MODERATE GROWTH PSEUDOMONAS AERUGINOSA LIGHT GROWTH KLEBSIELLA PNEUMONIAE REFER TO SENSITIVITIES FROM PREVIOUS CULTURE FOR ORG 2    Report Status 10/01/2015 FINAL  Final   Organism ID, Bacteria PSEUDOMONAS AERUGINOSA  Final   Organism ID, Bacteria KLEBSIELLA  PNEUMONIAE  Final      Susceptibility   Pseudomonas aeruginosa - MIC*    CEFTAZIDIME 4 SENSITIVE Sensitive     CIPROFLOXACIN 2 INTERMEDIATE Intermediate     GENTAMICIN >=16 RESISTANT Resistant     IMIPENEM >=16 RESISTANT Resistant     PIP/TAZO Value in next row Sensitive      SENSITIVE16    * MODERATE GROWTH PSEUDOMONAS AERUGINOSA  Tissue culture     Status: None   Collection Time: 09/24/15  6:44 AM  Result Value Ref Range Status   Specimen Description BONE  Final   Special Requests Normal  Final   Gram Stain MODERATE WBC SEEN FEW GRAM NEGATIVE RODS   Final   Culture   Final    MODERATE GROWTH ESCHERICHIA COLI MODERATE GROWTH KLEBSIELLA PNEUMONIAE ESBL-EXTENDED SPECTRUM BETA LACTAMASE-THE ORGANISM IS RESISTANT TO PENICILLINS, CEPHALOSPORINS AND AZTREONAM ACCORDING TO CLSI M100-S15 VOL.25 N01 JAN 2005. ORGANISM 1 This organism isolate is resistant to one or more antiotic agents in three or more antimicrobial categories.  Suggest Infectious Disease consult.   ORGANISM 2 CRITICAL RESULT CALLED TO, READ BACK BY AND VERIFIED WITH: RN Mardene Celeste LINDSAY 09/27/15 1005AM    Report Status 09/28/2015 FINAL  Final   Organism ID, Bacteria ESCHERICHIA COLI  Final   Organism ID, Bacteria KLEBSIELLA PNEUMONIAE  Final      Susceptibility   Escherichia coli - MIC*    AMPICILLIN >=32 RESISTANT Resistant     CEFTAZIDIME 4 RESISTANT Resistant     CEFAZOLIN >=64 RESISTANT Resistant     CEFTRIAXONE 8 RESISTANT  Resistant     GENTAMICIN <=1 SENSITIVE Sensitive     IMIPENEM 8 RESISTANT Resistant     TRIMETH/SULFA <=20 SENSITIVE Sensitive     Extended ESBL POSITIVE Resistant     PIP/TAZO Value in next row Resistant      RESISTANT>=128    * MODERATE GROWTH ESCHERICHIA COLI   Klebsiella pneumoniae - MIC*    AMPICILLIN Value in next row Resistant      RESISTANT>=128    CEFTAZIDIME Value in next row Resistant      RESISTANT>=128    CEFAZOLIN Value in next row Resistant      RESISTANT>=128    CEFTRIAXONE  Value in next row Resistant      RESISTANT>=128    CIPROFLOXACIN Value in next row Resistant      RESISTANT>=128    GENTAMICIN Value in next row Resistant      RESISTANT>=128    IMIPENEM Value in next row Resistant      RESISTANT>=128    TRIMETH/SULFA Value in next row Sensitive      RESISTANT>=128    PIP/TAZO Value in next row Resistant      RESISTANT>=128    * MODERATE GROWTH KLEBSIELLA PNEUMONIAE  Anaerobic culture     Status: None   Collection Time: 09/24/15  3:55 PM  Result Value Ref Range Status   Specimen Description BONE  Final   Special Requests Normal  Final   Culture NO ANAEROBES ISOLATED  Final   Report Status 09/29/2015 FINAL  Final  Culture, fungus without smear     Status: None   Collection Time: 09/24/15  3:55 PM  Result Value Ref Range Status   Specimen Description BONE  Final   Special Requests Normal  Final   Culture NO FUNGUS ISOLATED AFTER 24 DAYS  Final   Report Status 10/18/2015 FINAL  Final  C difficile quick scan w PCR reflex     Status: None   Collection Time: 10/07/15  2:02 PM  Result Value Ref Range Status   C Diff antigen NEGATIVE NEGATIVE Final   C Diff toxin NEGATIVE NEGATIVE Final   C Diff interpretation Negative for C. difficile  Final  Culture, blood (routine x 2)     Status: None   Collection Time: 10/16/15  9:52 AM  Result Value Ref Range Status   Specimen Description BLOOD LEFT HAND  Final   Special Requests   Final    BOTTLES DRAWN AEROBIC AND ANAEROBIC  AER 6CC ANA 4CC   Culture NO GROWTH 6 DAYS  Final   Report Status 10/22/2015 FINAL  Final  Culture, blood (routine x 2)     Status: None   Collection Time: 10/16/15 12:25 PM  Result Value Ref Range Status   Specimen Description BLOOD LEFT HAND  Final   Special Requests BOTTLES DRAWN AEROBIC AND ANAEROBIC  5CC  Final   Culture NO GROWTH 6 DAYS  Final   Report Status 10/22/2015 FINAL  Final  Culture, expectorated sputum-assessment     Status: None   Collection Time: 10/18/15  1:15  PM  Result Value Ref Range Status   Specimen Description SPUTUM  Final   Special Requests NONE  Final   Sputum evaluation THIS SPECIMEN IS ACCEPTABLE FOR SPUTUM CULTURE  Final   Report Status 10/18/2015 FINAL  Final  Culture, respiratory (NON-Expectorated)     Status: None   Collection Time: 10/18/15  1:15 PM  Result Value Ref Range Status   Specimen Description SPUTUM  Final  Special Requests NONE Reflexed from [id number]  Final   Gram Stain   Final    GOOD SPECIMEN - 80-90% WBCS MODERATE WBC SEEN MANY GRAM NEGATIVE RODS    Culture   Final    HEAVY GROWTH PSEUDOMONAS AERUGINOSA MODERATE GROWTH KLEBSIELLA PNEUMONIAE CRITICAL RESULT CALLED TO, READ BACK BY AND VERIFIED WITHRod Mae AT 1610 10/21/15 DV KLEBSIELLA PNEUMONIAE This organism isolate is resistant to one or more antiotic agents in three or more antimicrobial categories.  Suggest Infectious Disease  consult.      Report Status 10/22/2015 FINAL  Final   Organism ID, Bacteria PSEUDOMONAS AERUGINOSA  Final   Organism ID, Bacteria KLEBSIELLA PNEUMONIAE  Final      Susceptibility   Klebsiella pneumoniae - MIC*    AMPICILLIN >=32 RESISTANT Resistant     CEFAZOLIN >=64 RESISTANT Resistant     CEFTRIAXONE >=64 RESISTANT Resistant     CIPROFLOXACIN >=4 RESISTANT Resistant     GENTAMICIN >=16 RESISTANT Resistant     IMIPENEM >=16 RESISTANT Resistant     NITROFURANTOIN >=512 RESISTANT Resistant     TRIMETH/SULFA <=20 SENSITIVE Sensitive     * MODERATE GROWTH KLEBSIELLA PNEUMONIAE   Pseudomonas aeruginosa - MIC*    CEFTAZIDIME 4 SENSITIVE Sensitive     CIPROFLOXACIN >=4 RESISTANT Resistant     GENTAMICIN 8 INTERMEDIATE Intermediate     IMIPENEM >=16 RESISTANT Resistant     PIP/TAZO Value in next row Sensitive      SENSITIVE32    * HEAVY GROWTH PSEUDOMONAS AERUGINOSA  C difficile quick scan w PCR reflex     Status: None   Collection Time: 10/18/15  4:39 PM  Result Value Ref Range Status   C Diff antigen NEGATIVE NEGATIVE  Final   C Diff toxin NEGATIVE NEGATIVE Final   C Diff interpretation Negative for C. difficile  Final  Culture, blood (Routine X 2) w Reflex to ID Panel     Status: None   Collection Time: 11/06/15  8:27 AM  Result Value Ref Range Status   Specimen Description BLOOD RIGHT HAND  Final   Special Requests BOTTLES DRAWN AEROBIC AND ANAEROBIC 3CC  Final   Culture NO GROWTH 5 DAYS  Final   Report Status 11/11/2015 FINAL  Final  Culture, blood (Routine X 2) w Reflex to ID Panel     Status: None   Collection Time: 11/06/15  8:29 AM  Result Value Ref Range Status   Specimen Description BLOOD LEFT HAND  Final   Special Requests BOTTLES DRAWN AEROBIC AND ANAEROBIC  1CC  Final   Culture NO GROWTH 5 DAYS  Final   Report Status 11/11/2015 FINAL  Final  Culture, expectorated sputum-assessment     Status: None   Collection Time: 11/06/15  8:41 AM  Result Value Ref Range Status   Specimen Description EXPECTORATED SPUTUM  Final   Special Requests Immunocompromised  Final   Sputum evaluation THIS SPECIMEN IS ACCEPTABLE FOR SPUTUM CULTURE  Final   Report Status 11/06/2015 FINAL  Final  Culture, respiratory (NON-Expectorated)     Status: None   Collection Time: 11/06/15  8:41 AM  Result Value Ref Range Status   Specimen Description EXPECTORATED SPUTUM  Final   Special Requests Immunocompromised Reflexed from [id number]  Final   Gram Stain   Final    MANY WBC SEEN MANY GRAM NEGATIVE RODS EXCELLENT SPECIMEN - 90-100% WBCS    Culture   Final    MODERATE GROWTH KLEBSIELLA PNEUMONIAE MODERATE GROWTH PSEUDOMONAS AERUGINOSA  CRITICAL RESULT CALLED TO, READ BACK BY AND VERIFIED WITH: DR. FITZGERALD AT 1120 11/09/15 DV DR. FITZGERALD REQUESTED ORGANISM BE SENT OUT FOR TIGECYCLINE CEFT/AVIBACTAM AND COLISTIN Sent to [facility] for further susceptibility testing. 11/11/15 CTJ    Report Status 11/15/2015 FINAL  Final   Organism ID, Bacteria KLEBSIELLA PNEUMONIAE  Final   Organism ID, Bacteria PSEUDOMONAS  AERUGINOSA  Final      Susceptibility   Klebsiella pneumoniae - MIC*    AMPICILLIN >=32 RESISTANT Resistant     CEFAZOLIN >=64 RESISTANT Resistant     CEFTRIAXONE 32 RESISTANT Resistant     CIPROFLOXACIN >=4 RESISTANT Resistant     GENTAMICIN >=16 RESISTANT Resistant     IMIPENEM >=16 RESISTANT Resistant     NITROFURANTOIN 256 RESISTANT Resistant     TRIMETH/SULFA >=320 RESISTANT Resistant     Extended ESBL POSITIVE Resistant     * MODERATE GROWTH KLEBSIELLA PNEUMONIAE   Pseudomonas aeruginosa - MIC*    CEFTAZIDIME >=64 RESISTANT Resistant     CIPROFLOXACIN 2 INTERMEDIATE Intermediate     GENTAMICIN >=16 RESISTANT Resistant     IMIPENEM >=16 RESISTANT Resistant     * MODERATE GROWTH PSEUDOMONAS AERUGINOSA  Culture, respiratory (NON-Expectorated)     Status: None   Collection Time: 11/14/15  2:07 PM  Result Value Ref Range Status   Specimen Description TRACHEAL ASPIRATE  Final   Special Requests NONE  Final   Gram Stain   Final    EXCELLENT SPECIMEN - 90-100% WBCS MANY WBC SEEN MODERATE GRAM NEGATIVE RODS    Culture   Final    HEAVY GROWTH PSEUDOMONAS AERUGINOSA CRITICAL VALUE NOTED.  VALUE IS CONSISTENT WITH PREVIOUSLY REPORTED AND CALLED VALUE.    Report Status 11/16/2015 FINAL  Final   Organism ID, Bacteria PSEUDOMONAS AERUGINOSA  Final      Susceptibility   Pseudomonas aeruginosa - MIC*    CEFTAZIDIME >=64 RESISTANT Resistant     CIPROFLOXACIN 2 INTERMEDIATE Intermediate     GENTAMICIN >=16 RESISTANT Resistant     IMIPENEM >=16 RESISTANT Resistant     PIP/TAZO Value in next row Resistant      RESISTANT>=128    TOBRAMYCIN Value in next row Sensitive      SENSITIVE2    * HEAVY GROWTH PSEUDOMONAS AERUGINOSA    Coagulation Studies: No results for input(s): LABPROT, INR in the last 72 hours.  Urinalysis: No results for input(s): COLORURINE, LABSPEC, PHURINE, GLUCOSEU, HGBUR, BILIRUBINUR, KETONESUR, PROTEINUR, UROBILINOGEN, NITRITE, LEUKOCYTESUR in the last 72  hours.  Invalid input(s): APPERANCEUR    Imaging: No results found.   Medications:   . dextrose 25 mL/hr at 12/04/15 0555  . feeding supplement (VITAL AF 1.2 CAL) 1,000 mL (12/04/15 0908)  . norepinephrine (LEVOPHED) Adult infusion 6 mcg/min (12/04/15 1115)   . albuterol  2.5 mg Nebulization Q6H  . amiodarone  100 mg Per Tube Daily  . antiseptic oral rinse  7 mL Mouth Rinse BID  . antiseptic oral rinse  7 mL Mouth Rinse QID  . aspirin  81 mg Per Tube Daily  . chlorhexidine gluconate  15 mL Mouth Rinse BID  . colistimethate (COLISTIN) IV  250 mg Intravenous Q48H  . epoetin (EPOGEN/PROCRIT) injection  10,000 Units Intravenous Q M,W,F-HD  . escitalopram  10 mg Per Tube Daily  . feeding supplement (PRO-STAT SUGAR FREE 64)  30 mL Per Tube 6 X Daily  . free water  20 mL Per Tube 6 times per day  . heparin subcutaneous  5,000 Units  Subcutaneous Q12H  . hydrocortisone  10 mg Per Tube BID  . lidocaine  1 patch Transdermal Q24H  . midodrine  10 mg Per Tube TID WC  . mirtazapine  7.5 mg Per Tube QHS  . multivitamin  5 mL Per Tube Daily  . pantoprazole sodium  40 mg Per Tube Daily  . sodium chloride  10-40 mL Intracatheter Q12H  . sodium hypochlorite   Irrigation BID  . tigecycline (TYGACIL) IVPB  50 mg Intravenous Q12H  . tobramycin (PF)  300 mg Nebulization BID   acetaminophen (TYLENOL) oral liquid 160 mg/5 mL, albuterol, HYDROmorphone (DILAUDID) injection, loperamide, ondansetron (ZOFRAN) IV, oxyCODONE, polyvinyl alcohol, sodium chloride, zinc oxide  Assessment/ Plan:  50 y.o. 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 Admitted on 07/28/2015  Echo: 11/07/19: EF of 30% to 35%.   1. End-stage renal disease on hemodialysis on HD MWF. The patient has been on dialysis since October of 2014. R IJ permcath.  - Dialysis treatment with IV albumin for oncotic support. Slow BFR   - Midodrine and hydrocortisone ordered for blood pressure support.  - Continue MWF schedule,  Did not do well with HD this past Friday. No fluid coud be removed despite iv albumin, decreased temp and increased dose of iv levophed - Overall prognosis is extremely poor- Focus on patient comfort measures. - In my opinion she will not be able to get well enough for an outpatient dialysis placement - recommend DNR  2. Anemia of CKD. Hemoglobin 8.5. Multiple transfusions during admission   - Epogen and periodic blood transfusions. Not a candidate for IV iron due to concurrent infections  3. Acute resp failure: ventilator dependent -prolonged course on ventilator,     4. Anasarca - UF as with HD as tolerated  5. Other:  - Sepsis:  Multi drug resistant organism - large st 4 decubitus - poor nutrition         LOS: 114 Kahliyah Dick 1/15/201711:40 AM

## 2015-12-04 NOTE — Progress Notes (Addendum)
PULMONARY / CRITICAL CARE MEDICINE   Name: Courtney [name] MRN: [id number] DOB: August 28, 1966    ADMISSION DATE:  07/24/2015  BRIEF HISTORY: 49 AAF who has been in medical facilities (hosp, LTAC, rehab) for 2 yrs following gastric bypass surgery with multiple complications. Now with chronic trach, ESRD, profound debilitation, severe sacral pressure ulcer. Was seemingly making progress and transferred to rehab facility approx one week prior to this admission. She was sent to [facility] ED with AMS and hypotension. Working dx of severe sepsis/septic shock due to infected sacral pressure ulcer. Since admission. Her course is been very complicated with numerous complications including septic shock and GI bleeding. Now with failure to wean from vent and possible MI event, repeat ECHO EF 30-35, possible old MI, severe hypokinesis of the anterior and anteroseptal myocardium   SUBJ: Stable overnight, no acute events, Levophed down to , only opening eyes. Fio2 35%.  Labs and CXR in the AM. Leukocytosis overnight, will cont with current abx.    Summary of MAJOR EVENTS/TEST RESULTS: Admission 02/07/14-05/07/14 Admission 07/21/14-09/06/14 Discharged to [facility]. Pt had palliative consult at that time, were asked to sign off by husband.  09/23 CT head: NAD 09/23 EEG: no epileptiform activity 09/23 PRBCs for Hgb 6.4 09/24 bedside debridement of sacral wound. Abscess drained 09/25 Off vasopressors. More alert. No distress. Worsening thrombocytopenia. Vanc DC'd 09/29 Dr. Sampson Goon (I.D) excused from the case by patient's husband. 10/02 MRI -multiple infarcts 10/03 tracheal bleeding- transfused platelets 10/03 hospitalist service excused from the case by patient's husband 10/03 Echocardiogram ejection fraction was 55-60%, pulmonary systolic pressure was 39 mmHg 40/98 restart TF's at lower rate, attempt reg HD  10/12 Transferred to med-surg floor. Remains on PCCM service 10/14 SLP eval: pt unable to  tolerate PMV adequately 10/17-will re-attempt PM valve-discussed with Speech therapist 10/18 passed swallow eval-start pureed thick foods no thin liquids-continue NG feeds 10/19 transferred to step down for sepsis/aspiration pneumonia 10/19 cxr shows RLL opacity 10/21 sacral decub debride at bedside by surgery 10/22 started back on vasopressors while on HD 10/27 CT with osteo, R hip fx, unable to identify tip of dubhoff tube - sent for fluoro study 10/28 Ortho consultation: I do not feel that she is a surgical candidate. Therefore, I feel that it would best to manage this fracture nonsurgically and allow it to heal by itself over time, which it should.  10/28 Gen Surg consultation: Due to the lack of free air or free flow of contrast and the peritoneum there is no indication for any surgical intervention on this. Would recommend pulling back the feeding tube 1-2 cm. Would recheck an abdominal film to confirm no pneumoperitoneum in the morning. Absence of any changes okay to continue using Dobbhoff for feeding and medications. 10/28 gastrograffin study: The study confirms that the feeding tube pes perforated through the duodenum and the tip is within a cavity that fills with injected contrast. The cavity does appear walled off 11/4 refuses oral feeds, continue TF's CT reviewed: T8-T9 discitis/osteomyelitis, RT HIP FRACTURE 11/5 placed back on vasopressors, placed back on Vent due to resp acidosis, levophed turned off 11/6 afternoon 11/5 PM Trach changed out due to cuff leak, #6 Shiley cuffed, 11/6 dubhoff occluded, removed and replaced, new dubhoff shows tube in the antral stomach 11/15 refusing to take oral feeds 11/18 placed back on Vent for increased WOB,SOB. 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 10/18/2015; the patient  developed a new left basal pneumonia; with recurrent sepsis.  10/19/2015; fevers resolved  with IV acetaminophen. Tube feeds changed from vital to Jevity.  12/7 CXR with stable b/l opacities.  12/12 elevated K s/p HD-remains on AC mode 12/16-vomiting-hold feeds and re-assess in next 24 hrs 12/17- TF restarted at 1/2 goal (goal = 50cc/per) 12/18- elevated wbc, fever (101.4), recultured, restarted on ceftaz 12/19 Pericardial friction rub. Echocardiogram: LVEF 30-35%, anteroseptal hypokinesis or akinesis c/w anterior wall MI 12/20 Dr Sung Amabile discussed new echocardiogram findings with pt's husband and explained that this likely represents an anterior wall MI sometime in the previous couple of weeks. It was explained that she is not a candidate for any cardiology intervention. Code status was again discussed. Husband expressed understanding that she was not going to survive this illness in any favorable way but continues to request that she be full code status based on what he believes to be her wishes 12/21 vomitied 50cc of TF - AM TF held, restart TF at 3pm @30cc  12/26 Persistent intermittent trach cuff leak. Distal XLT trach requested.  12/27 15 beat NSVT 12/28 patient refused HD and refused Bath time 12/29 D10 infusion for hypoglycemia, TF's on hold due to intolerance 12/30 CODE BLUE  VTACH 01/01 transfuse 2 units of blood 01/02 increased respiratory distress, copious purulent secretions 01/02 LE venous US: No DVT 01/03 Repeat TTE: The cavity size was mildly dilated. Systolic function was moderately to severely reduced. The estimated ejection fraction was in the range of 30% to 35%. Diffuse hypokinesis. Moderate to severe hypokinesis of the anteroseptal, anterior, and apical myocardium. Hypokinesis of the anterior myocardium 01/05 Hypothermic - Bair Hugger ordered 01/10-placed back on vasopressors for septic shock 1/11-failed SBT due to resp muscle fatigue 1/13-worsening shock and hypoxia, agonal respirations  INDWELLING DEVICES:: Trach (chronic) placed June 2014 Tunneled R IJ  HD cath (chronic) Tunneled L IJ CVL (chronic) L femoral A-line 9/23 >> 9/25  MICRO DATA: 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 Pneumoniae, P. Mirabilis, VRE Wound 9/26 >> No growth Blood 9/23 >> NEG CDiff 9/27>>neg Stool Cx 10/15>> negative Cdiff 10/25>>neg Trach Aspirate 10/28>> light growth pseudomonas Blood 10/28 >> 1/2 GPC >>  Sputum cultures obtained 09/22/15 due to mucus plugging>>Pseudomonas BONE TISSUE Cx 11/3>>E.coli (ESBL), k. Pneumonia (daptomycin and septra) Bld Cx 11/27>>negative thus far.  Trach Asp 11/27>> grew out Pseudomonas, and Klebsiella pneumonia. Trach Asp 12/18>> MDR Klebsiella, Pseudomonas Bld Cx 12/18>> NEG 12/26 resp culture >> MDR pseudomonas  ANTIMICROBIALS:  Aztreonam 9/23 >> 9/24 Vanc 9/23 >> 9/25 Vanc 9/26>>9/27 Daptomycin 9/27>> 10/12, 10/28>> off Meropenem 9/23 >> 10/14, 10/28 >> 10/31 Zosyn 11/27,>> 11/30. Levaquin 11/29>> 11/29. Ceftaz 12/1>>12/11, 12/18>> 12/24 Colisitin 12/26 >>  Tigecycline 12/26 >>  Nebulized tobramycin 12/30 >>  VITAL SIGNS: Temp:  [97.7 F (36.5 C)-98.9 F (37.2 C)] 97.7 F (36.5 C) (01/15 0815) Pulse Rate:  [39-87] 39 (01/15 0915) Resp:  [0-41] 14 (01/15 0915) BP: (86-145)/(39-80) 110/41 mmHg (01/15 0915) SpO2:  [89 %-100 %] 95 % (01/15 0915) FiO2 (%):  [30 %-40 %] 35 % (01/15 0816) HEMODYNAMICS:   VENTILATOR SETTINGS: Vent Mode:  [-] PRVC FiO2 (%):  [30 %-40 %] 35 % Set Rate:  [14 bmp] 14 bmp Vt Set:  [500 mL] 500 mL PEEP:  [5 cmH20] 5 cmH20 INTAKE / OUTPUT:  Intake/Output Summary (Last  24 hours) at 12/04/15 0956 Last data filed at 12/04/15 0700  Gross per 24 hour  Intake 2180.77 ml  Output      0 ml  Net 2180.77 ml    Review of Systems  Unable to perform ROS: critical illness   Physical  Exam  Constitutional:  Critical ill appearance  HENT:  Head: Normocephalic and atraumatic.  Right Ear: External ear normal.  Left Ear: External ear normal.  Eyes: Conjunctivae are normal. Pupils are equal, round, and reactive to light. Right eye exhibits no discharge. Left eye exhibits no discharge.  Neck: Neck supple. No tracheal deviation present. No thyromegaly present.  Cardiovascular: Regular rhythm, normal heart sounds and intact distal pulses.   No murmur heard. Pulmonary/Chest: She has no wheezes.  Tracheostomy - on MV  Abdominal: Soft. Bowel sounds are normal. She exhibits no distension.  Musculoskeletal: She exhibits edema.  Ext cool, trace symmetric edema, marked muscle wasting Severe sacral decubitus ulcer- to the spine  Skin: Skin is warm.  Nursing note and vitals reviewed.   RASS 0, intermittent trach tube cuff leak,vent graphics look good, no air leak  HEENT; Cushingoid facies, temporal wasting, NGT present. Tube feeds are infusing. Trach site clean Diffuse rhonchi Reg with occ extrasystoles, no M Abdomen soft, + BS Ext cool, trace symmetric edema, marked muscle wasting Severe sacral decubitus ulcer- to the spine  LABS:  CBC  Recent Labs Lab 11/30/15 1633 12/02/15 1609 12/03/15 0552  WBC 13.3* 13.5* 15.5*  HGB 8.9* 8.8* 8.5*  HCT 29.7* 30.4* 28.0*  PLT 68* 58* 58*   Coag's No results for input(s): APTT, INR in the last 168 hours. BMET  Recent Labs Lab 11/28/15 1348 11/30/15 1633 12/02/15 1609  NA 129* 129* 132*  K 3.8 3.9 3.7  CL 95* 97* 96*  CO2 26 27 29   BUN 102* 95* 95*  CREATININE 1.45* 1.38* 1.34*  GLUCOSE 109* 140* 186*   Electrolytes  Recent Labs Lab 11/28/15 1348 11/30/15 1633 12/02/15 1609  CALCIUM 8.0* 8.0* 8.2*  PHOS 6.1*  --  5.6*   Sepsis Markers No results for input(s): LATICACIDVEN, PROCALCITON, O2SATVEN in the last 168 hours. ABG No results for input(s): PHART, PCO2ART, PO2ART in the last 168 hours. Liver  Enzymes  Recent Labs Lab 11/28/15 1348 12/02/15 1609  ALBUMIN 1.6* 1.6*   Cardiac Enzymes No results for input(s): TROPONINI, PROBNP in the last 168 hours. Glucose  Recent Labs Lab 12/02/15 1552 12/02/15 2339 12/03/15 0752 12/03/15 1604 12/03/15 2340 12/04/15 0740  GLUCAP 172* 117* 106* 104* 146* 145*    Imaging No results found.  ASSESSMENT / PLAN: IMPRESSION: Very prolonged and complicated hospitalization after gastric bypass surgery 2014 Never returned home ESRD-hd as tolerated Recurrent severe sepsis - PAN resistant Klebisella in sputum Recurrent PNA-now with pseudomonas H/O C diff H/O VTE DM2 - controlled Episodic hypoglycemia Chronic steroid therapy, for a history of of adrenal insufficiency Incidental finding of R hip fx - conservative mgmt. 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 Husband declined TEE Profound deconditioning Chronic pain New finding of pericardial friction rub 12/19 - resolved New finding of cardiomyopathy, likely post MI 12/19  PAF/flutter Sinus bradycardia HCAP with MDR Donalee Citrin and pseudomonas Chronic anemia Severe protein -  calorie malnutrition TF intolerance Thrombocytopenia 01/13 - may need to consider stopping heparin and ASA if plts continues to be low on lab recheck in the AM.  Cont vent support - settings reviewed and/or adjusted Cont vent bundle Cont HD per renal Monitor BMET intermittently Monitor I/Os Correct electrolytes as indicated DVT px: SQ heparin Monitor CBC intermittently Transfuse per usual guidelines Monitor temp, WBC count Micro and abx as above ID following - appreciate recs Cont Amiodarone -  100 mg per tube daily DC metoprolol Cont opiates PRN Cont D10 Infusion for hypoglycemia TFs @  30 cc/hr Vasopressors to keep MAP>60   Updated husband via telephone and at bedside - husband asked about neuro stimulant since patient has been more somnolent with depressed mentation, Dr. Heidi DachZeilykman (neuro) input appreciated - he stated that patient is high risk for seizures and stimulant can be readdressed during the week if clinically stable.   Prognosis is very poor without hope for functional recovery. Patient is dialysis and Vent dependent with severe sacral decub ulcer with chronic bone infection and spinal infection with underlying CHF and CVA with recurrent pneumonia.   This patient in my opinion is deemed Futile. Husband understands but unrealistic of goals of care  Patient with intermittent delirium with chronic sepsis and can not make decisions for herself.   Would recommend DNR status and Comfort care measures  I have personally obtained a history, examined the patient, evaluated Pertinent laboratory and RadioGraphic/imaging results, and  formulated the assessment and plan   The Patient requires high complexity decision making for assessment and support, frequent evaluation and titration of therapies, application of advanced monitoring technologies and extensive interpretation of multiple databases. Critical Care Time devoted to patient care services described in this note is 35 minutes.   Overall, patient is critically ill, prognosis is guarded.  Patient with Multiorgan failure and at high risk for cardiac arrest and death.    Stephanie AcreVishal Heyli Min, MD [facility] Pager [phone]- [phone] (please enter 7-digits) On Call Pager - [phone] (please enter 7-digits)

## 2015-12-04 NOTE — Progress Notes (Signed)
MEDICATION RELATED CONSULT NOTE - Follow up   Pharmacy Consult for Renal dosing/HD  Allergies  Allergen Reactions  . Contrast Media [Iodinated Diagnostic Agents] Anaphylaxis  . Ampicillin Rash   Patient Measurements: Ht 785ft 9in Height:  (SCALE BROKE) Weight:  (No bedscale) IBW/kg (Calculated) : 66.2  Vital Signs: Temp: 97.6 F (36.4 C) (01/15 1600) Temp Source: Axillary (01/15 1230) BP: 112/47 mmHg (01/15 1630) Pulse Rate: 37 (01/15 1630)  Recent Labs  12/02/15 1609 12/03/15 0552  WBC 13.5* 15.5*  HGB 8.8* 8.5*  HCT 30.4* 28.0*  PLT 58* 58*  CREATININE 1.34*  --   PHOS 5.6*  --   ALBUMIN 1.6*  --    Estimated Creatinine Clearance: 63.8 mL/min (by C-G formula based on Cr of 1.34).  Assessment: 50 yo patient on HD MWF. Pharmacy consulted for renal dosing of medications.   Plan:  No medications require adjustment at present. Pharmacy will continue to monitor and dose adjust as needed.    Bari MantisKristin Heidemarie Goodnow PharmD Clinical Pharmacist 12/04/2015
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
## 2015-12-05 ENCOUNTER — Inpatient Hospital Stay: Payer: 59

## 2015-12-05 LAB — COMPREHENSIVE METABOLIC PANEL
ALBUMIN: 1.7 g/dL — AB (ref 3.5–5.0)
ALT: 34 U/L (ref 14–54)
ANION GAP: 6 (ref 5–15)
AST: 43 U/L — ABNORMAL HIGH (ref 15–41)
Alkaline Phosphatase: 2594 U/L — ABNORMAL HIGH (ref 38–126)
BUN: 106 mg/dL — ABNORMAL HIGH (ref 6–20)
CO2: 29 mmol/L (ref 22–32)
Calcium: 8 mg/dL — ABNORMAL LOW (ref 8.9–10.3)
Chloride: 96 mmol/L — ABNORMAL LOW (ref 101–111)
Creatinine, Ser: 1.36 mg/dL — ABNORMAL HIGH (ref 0.44–1.00)
GFR calc Af Amer: 52 mL/min — ABNORMAL LOW (ref >60)
GFR calc non Af Amer: 45 mL/min — ABNORMAL LOW (ref >60)
GLUCOSE: 124 mg/dL — AB (ref 65–99)
POTASSIUM: 3.9 mmol/L (ref 3.5–5.1)
SODIUM: 131 mmol/L — AB (ref 135–145)
Total Bilirubin: 1.2 mg/dL (ref 0.3–1.2)
Total Protein: 4.9 g/dL — ABNORMAL LOW (ref 6.5–8.1)

## 2015-12-05 LAB — CBC WITH DIFFERENTIAL/PLATELET
BASOS ABS: 0.1 10*3/uL (ref 0–0.1)
Basophils Relative: 1 %
Eosinophils Absolute: 0.1 10*3/uL (ref 0–0.7)
Eosinophils Relative: 1 %
HEMATOCRIT: 23.7 % — AB (ref 35.0–47.0)
Hemoglobin: 7.2 g/dL — ABNORMAL LOW (ref 12.0–16.0)
Lymphs Abs: 0.9 10*3/uL — ABNORMAL LOW (ref 1.0–3.6)
MCH: 29.8 pg (ref 26.0–34.0)
MCHC: 30.2 g/dL — ABNORMAL LOW (ref 32.0–36.0)
MCV: 98.7 fL (ref 80.0–100.0)
MONO ABS: 1.3 10*3/uL — AB (ref 0.2–0.9)
NEUTROS ABS: 12.7 10*3/uL — AB (ref 1.4–6.5)
Neutrophils Relative %: 83 %
Platelets: 62 10*3/uL — ABNORMAL LOW (ref 150–440)
RBC: 2.4 MIL/uL — ABNORMAL LOW (ref 3.80–5.20)
RDW: 21.7 % — AB (ref 11.5–14.5)
WBC: 15.1 10*3/uL — ABNORMAL HIGH (ref 3.6–11.0)

## 2015-12-05 LAB — GLUCOSE, CAPILLARY
GLUCOSE-CAPILLARY: 123 mg/dL — AB (ref 65–99)
Glucose-Capillary: 133 mg/dL — ABNORMAL HIGH (ref 65–99)
Glucose-Capillary: 118 mg/dL — ABNORMAL HIGH (ref 65–99)

## 2015-12-05 LAB — PREALBUMIN: PREALBUMIN: 4.3 mg/dL — AB (ref 18–38)

## 2015-12-05 LAB — MAGNESIUM: Magnesium: 2 mg/dL (ref 1.7–2.4)

## 2015-12-05 LAB — PHOSPHORUS: Phosphorus: 5.4 mg/dL — ABNORMAL HIGH (ref 2.5–4.6)

## 2015-12-05 NOTE — Progress Notes (Signed)
ANTIBIOTIC CONSULT NOTE - follow Up  Pharmacy Consult for Colistin and Tigecycline  Indication: MDR Respiratory Infection   Allergies  Allergen Reactions  . Contrast Media [Iodinated Diagnostic Agents] Anaphylaxis  . Ampicillin Rash   Patient Measurements: Height:  (SCALE BROKE) Weight:  (Beds scale broken) IBW/kg (Calculated) : 66.2  Vital Signs: Temp: 97.8 F (36.6 C) (01/16 1306) Temp Source: Axillary (01/16 1306) BP: 121/58 mmHg (01/16 1306) Pulse Rate: 78 (01/16 1306) Intake/Output from previous day: 01/15 0701 - 01/16 0700 In: 2157 [I.V.:683.6; NG/GT:1120; IV Piggyback:353.3] Out: -  Intake/Output from this shift: Total I/O In: 174.1 [I.V.:84.1; NG/GT:90] Out: 500 [Other:500]  Recent Labs  12/02/15 1609 12/03/15 0552 12/05/15 0538  WBC 13.5* 15.5* 15.1*  HGB 8.8* 8.5* 7.2*  PLT 58* 58* 62*  CREATININE 1.34*  --  1.36*   Estimated Creatinine Clearance: 62.9 mL/min (by C-G formula based on Cr of 1.36). No results for input(s): VANCOTROUGH, VANCOPEAK, VANCORANDOM, GENTTROUGH, GENTPEAK, GENTRANDOM, TOBRATROUGH, TOBRAPEAK, TOBRARND, AMIKACINPEAK, AMIKACINTROU, AMIKACIN in the last 72 hours.   Microbiology: Recent Results (from the past 720 hour(s))  Culture, blood (Routine X 2) w Reflex to ID Panel     Status: None   Collection Time: 11/06/15  8:27 AM  Result Value Ref Range Status   Specimen Description BLOOD RIGHT HAND  Final   Special Requests BOTTLES DRAWN AEROBIC AND ANAEROBIC 3CC  Final   Culture NO GROWTH 5 DAYS  Final   Report Status 11/11/2015 FINAL  Final  Culture, blood (Routine X 2) w Reflex to ID Panel     Status: None   Collection Time: 11/06/15  8:29 AM  Result Value Ref Range Status   Specimen Description BLOOD LEFT HAND  Final   Special Requests BOTTLES DRAWN AEROBIC AND ANAEROBIC  1CC  Final   Culture NO GROWTH 5 DAYS  Final   Report Status 11/11/2015 FINAL  Final  Culture, expectorated sputum-assessment     Status: None   Collection  Time: 11/06/15  8:41 AM  Result Value Ref Range Status   Specimen Description EXPECTORATED SPUTUM  Final   Special Requests Immunocompromised  Final   Sputum evaluation THIS SPECIMEN IS ACCEPTABLE FOR SPUTUM CULTURE  Final   Report Status 11/06/2015 FINAL  Final  Culture, respiratory (NON-Expectorated)     Status: None   Collection Time: 11/06/15  8:41 AM  Result Value Ref Range Status   Specimen Description EXPECTORATED SPUTUM  Final   Special Requests Immunocompromised Reflexed from [id number]  Final   Gram Stain   Final    MANY WBC SEEN MANY GRAM NEGATIVE RODS EXCELLENT SPECIMEN - 90-100% WBCS    Culture   Final    MODERATE GROWTH KLEBSIELLA PNEUMONIAE MODERATE GROWTH PSEUDOMONAS AERUGINOSA CRITICAL RESULT CALLED TO, READ BACK BY AND VERIFIED WITH: DR. FITZGERALD AT 1120 11/09/15 DV DR. FITZGERALD REQUESTED ORGANISM BE SENT OUT FOR TIGECYCLINE CEFT/AVIBACTAM AND COLISTIN Sent to [facility] for further susceptibility testing. 11/11/15 CTJ    Report Status 11/15/2015 FINAL  Final   Organism ID, Bacteria KLEBSIELLA PNEUMONIAE  Final   Organism ID, Bacteria PSEUDOMONAS AERUGINOSA  Final      Susceptibility   Klebsiella pneumoniae - MIC*    AMPICILLIN >=32 RESISTANT Resistant     CEFAZOLIN >=64 RESISTANT Resistant     CEFTRIAXONE 32 RESISTANT Resistant     CIPROFLOXACIN >=4 RESISTANT Resistant     GENTAMICIN >=16 RESISTANT Resistant     IMIPENEM >=16 RESISTANT Resistant     NITROFURANTOIN 256 RESISTANT Resistant  TRIMETH/SULFA >=320 RESISTANT Resistant     Extended ESBL POSITIVE Resistant     * MODERATE GROWTH KLEBSIELLA PNEUMONIAE   Pseudomonas aeruginosa - MIC*    CEFTAZIDIME >=64 RESISTANT Resistant     CIPROFLOXACIN 2 INTERMEDIATE Intermediate     GENTAMICIN >=16 RESISTANT Resistant     IMIPENEM >=16 RESISTANT Resistant     * MODERATE GROWTH PSEUDOMONAS AERUGINOSA  Culture, respiratory (NON-Expectorated)     Status: None   Collection Time: 11/14/15  2:07 PM  Result Value  Ref Range Status   Specimen Description TRACHEAL ASPIRATE  Final   Special Requests NONE  Final   Gram Stain   Final    EXCELLENT SPECIMEN - 90-100% WBCS MANY WBC SEEN MODERATE GRAM NEGATIVE RODS    Culture   Final    HEAVY GROWTH PSEUDOMONAS AERUGINOSA CRITICAL VALUE NOTED.  VALUE IS CONSISTENT WITH PREVIOUSLY REPORTED AND CALLED VALUE.    Report Status 11/16/2015 FINAL  Final   Organism ID, Bacteria PSEUDOMONAS AERUGINOSA  Final      Susceptibility   Pseudomonas aeruginosa - MIC*    CEFTAZIDIME >=64 RESISTANT Resistant     CIPROFLOXACIN 2 INTERMEDIATE Intermediate     GENTAMICIN >=16 RESISTANT Resistant     IMIPENEM >=16 RESISTANT Resistant     PIP/TAZO Value in next row Resistant      RESISTANT>=128    TOBRAMYCIN Value in next row Sensitive      SENSITIVE2    * HEAVY GROWTH PSEUDOMONAS AERUGINOSA    Medical History: Past Medical History  Diagnosis Date  . Obesity   . Dyslipidemia   . Hypertension   . Coronary artery disease     s/p BMS 2010 LAD  . Dysrhythmia     ventricular tachycardia resolved after LAD stent and beta blocker  . Diabetes mellitus     with retinopathy, neuropathy and microalbuminemia  . 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 Patient is on day 113 of hospital stay, and has been treated for multiple infections during this time.   Plan:  Per microbiology, Klebsiella is sensitive to Tygacil. Sensitivity results for Avycaz and colistin are still pending.   Continue tigecycline 50mg  IV q12h.   Continue  colistimethate 250mg  (~2.5 mg/kg) IV q48 hours.   Patient should be monitored for nephrotoxicity and neurotoxicity.  Pharmacy to continue to follow per consult.      Luisa Hart, PharmD Clinical Pharmacist   12/05/2015 1:40 PM

## 2015-12-05 NOTE — Progress Notes (Signed)
Subjective:  Patient remains quite critically ill. Currently on levo fed. Remains on the ventilator with FiO2 of 35% and PEEP of 5. Showing agonal pattern respirations.  Objective:  Vital signs in last 24 hours:  Temp:  [97.6 F (36.4 C)-97.8 F (36.6 C)] 97.8 F (36.6 C) (01/16 0400) Pulse Rate:  [36-83] 39 (01/16 0700) Resp:  [0-21] 0 (01/16 0700) BP: (90-171)/(35-69) 109/53 mmHg (01/16 0700) SpO2:  [96 %-100 %] 99 % (01/16 0843) FiO2 (%):  [35 %] 35 % (01/16 0843)  Weight change:  Filed Weights   11/21/15 0500 11/21/15 1540 11/22/15 0500  Weight: 107 kg (235 lb 14.3 oz) 107 kg (235 lb 14.3 oz) 102 kg (224 lb 13.9 oz)    Intake/Output: I/O last 3 completed shifts: In: 3246.2 [I.V.:1062.8; NG/GT:1730; IV Piggyback:453.3] Out: -    Intake/Output this shift:     Physical Exam: General: critically ill   Head/ENT: Moist oral mucosal membrane, +NG tube  Eyes: Anicteric, eyes open  Neck: Tracheostomy in place  Lungs:  vent assisted , diffuse crackles  Heart: S1S2 no rubs  Abdomen:  Soft, NT, BS present  Extremities:   Anasarca, 3+ in LE's  Neurologic: Eyes open but not following commands   Access:  R IJ permcath.       Basic Metabolic Panel:  Recent Labs Lab 11/28/15 1348 11/30/15 1633 12/02/15 1609 12/05/15 0538  NA 129* 129* 132* 131*  K 3.8 3.9 3.7 3.9  CL 95* 97* 96* 96*  CO2 26 27 29 29   GLUCOSE 109* 140* 186* 124*  BUN 102* 95* 95* 106*  CREATININE 1.45* 1.38* 1.34* 1.36*  CALCIUM 8.0* 8.0* 8.2* 8.0*  MG  --   --   --  2.0  PHOS 6.1*  --  5.6* 5.4*    Liver Function Tests:  Recent Labs Lab 11/28/15 1348 12/02/15 1609 12/05/15 0538  AST  --   --  43*  ALT  --   --  34  ALKPHOS  --   --  2594*  BILITOT  --   --  1.2  PROT  --   --  4.9*  ALBUMIN 1.6* 1.6* 1.7*   No results for input(s): LIPASE, AMYLASE in the last 168 hours. No results for input(s): AMMONIA in the last 168 hours.  CBC:  Recent Labs Lab 11/28/15 1348  11/30/15 1633 12/02/15 1609 12/03/15 0552 12/05/15 0538  WBC 19.6* 13.3* 13.5* 15.5* 15.1*  NEUTROABS  --  11.4*  --   --  12.7*  HGB 9.0* 8.9* 8.8* 8.5* 7.2*  HCT 29.6* 29.7* 30.4* 28.0* 23.7*  MCV 94.8 97.5 98.8 98.0 98.7  PLT 81* 68* 58* 58* 62*    Cardiac Enzymes: No results for input(s): CKTOTAL, CKMB, CKMBINDEX, TROPONINI in the last 168 hours.  BNP: Invalid input(s): POCBNP  CBG:  Recent Labs Lab 12/03/15 2340 12/04/15 0740 12/04/15 1551 12/04/15 2332 12/05/15 0750  GLUCAP 146* 145* 151* 123* 133*    Microbiology: Results for orders placed or performed during the hospital encounter of 06-Sep-2015  Blood Culture (routine x 2)     Status: None   Collection Time: 09/06/2015  8:51 AM  Result Value Ref Range Status   Specimen Description BLOOD [name]  Final   Special Requests BOTTLES DRAWN AEROBIC AND ANAEROBIC  3CC  Final   Culture NO GROWTH 5 DAYS  Final   Report Status 08/17/2015 FINAL  Final  Blood Culture (routine x 2)     Status: None  Collection Time: 12-28-2014  9:20 AM  Result Value Ref Range Status   Specimen Description BLOOD LEFT ARM  Final   Special Requests BOTTLES DRAWN AEROBIC AND ANAEROBIC  1CC  Final   Culture NO GROWTH 5 DAYS  Final   Report Status 08/17/2015 FINAL  Final  Wound culture     Status: None   Collection Time: 12-28-2014  9:20 AM  Result Value Ref Range Status   Specimen Description DECUBITIS  Final   Special Requests Normal  Final   Gram Stain   Final    FEW WBC SEEN MANY GRAM NEGATIVE RODS RARE GRAM POSITIVE COCCI    Culture   Final    HEAVY GROWTH ESCHERICHIA COLI MODERATE GROWTH ENTEROBACTER AEROGENES PROTEUS MIRABILIS HEAVY GROWTH ENTEROCOCCUS SPECIES VRE HAVE INTRINSIC RESISTANCE TO MOST COMMONLY USED ANTIBIOTICS AND THE ABILITY TO ACQUIRE RESISTANCE TO MOST AVAILABLE ANTIBIOTICS.    Report Status 08/16/2015 FINAL  Final   Organism ID, Bacteria ESCHERICHIA COLI  Final   Organism ID, Bacteria ENTEROBACTER AEROGENES  Final    Organism ID, Bacteria PROTEUS MIRABILIS  Final   Organism ID, Bacteria ENTEROCOCCUS SPECIES  Final      Susceptibility   Enterobacter aerogenes - MIC*    CEFTAZIDIME <=1 SENSITIVE Sensitive     CEFAZOLIN >=64 RESISTANT Resistant     CEFTRIAXONE <=1 SENSITIVE Sensitive     CIPROFLOXACIN <=0.25 SENSITIVE Sensitive     GENTAMICIN <=1 SENSITIVE Sensitive     IMIPENEM 1 SENSITIVE Sensitive     TRIMETH/SULFA <=20 SENSITIVE Sensitive     * MODERATE GROWTH ENTEROBACTER AEROGENES   Escherichia coli - MIC*    AMPICILLIN <=2 SENSITIVE Sensitive     CEFTAZIDIME <=1 SENSITIVE Sensitive     CEFAZOLIN <=4 SENSITIVE Sensitive     CEFTRIAXONE <=1 SENSITIVE Sensitive     CIPROFLOXACIN <=0.25 SENSITIVE Sensitive     GENTAMICIN <=1 SENSITIVE Sensitive     IMIPENEM <=0.25 SENSITIVE Sensitive     TRIMETH/SULFA <=20 SENSITIVE Sensitive     * HEAVY GROWTH ESCHERICHIA COLI   Proteus mirabilis - MIC*    AMPICILLIN >=32 RESISTANT Resistant     CEFTAZIDIME <=1 SENSITIVE Sensitive     CEFAZOLIN 8 SENSITIVE Sensitive     CEFTRIAXONE <=1 SENSITIVE Sensitive     CIPROFLOXACIN <=0.25 SENSITIVE Sensitive     GENTAMICIN <=1 SENSITIVE Sensitive     IMIPENEM 1 SENSITIVE Sensitive     TRIMETH/SULFA <=20 SENSITIVE Sensitive     * PROTEUS MIRABILIS   Enterococcus species - MIC*    AMPICILLIN >=32 RESISTANT Resistant     VANCOMYCIN >=32 RESISTANT Resistant     GENTAMICIN SYNERGY SENSITIVE Sensitive     TETRACYCLINE Value in next row Resistant      RESISTANT>=16    * HEAVY GROWTH ENTEROCOCCUS SPECIES  MRSA PCR Screening     Status: None   Collection Time: 12-28-2014  2:38 PM  Result Value Ref Range Status   MRSA by PCR NEGATIVE NEGATIVE Final    Comment:        The GeneXpert MRSA Assay (FDA approved for NASAL specimens only), is one component of a comprehensive MRSA colonization surveillance program. It is not intended to diagnose MRSA infection nor to guide or monitor treatment for MRSA infections.    Blood culture (single)     Status: None   Collection Time: 12-28-2014  3:36 PM  Result Value Ref Range Status   Specimen Description BLOOD RIGHT ASSIST CONTROL  Final   Special  Requests BOTTLES DRAWN AEROBIC AND ANAEROBIC  Final   Culture NO GROWTH 5 DAYS  Final   Report Status 08/17/2015 FINAL  Final  Wound culture     Status: None   Collection Time: 08/13/15 12:37 PM  Result Value Ref Range Status   Specimen Description WOUND  Final   Special Requests Normal  Final   Gram Stain   Final    FEW WBC SEEN TOO NUMEROUS TO COUNT GRAM NEGATIVE RODS FEW GRAM POSITIVE COCCI    Culture   Final    HEAVY GROWTH ESCHERICHIA COLI MODERATE GROWTH PROTEUS MIRABILIS LIGHT GROWTH KLEBSIELLA PNEUMONIAE MODERATE GROWTH ENTEROCOCCUS GALLINARUM CRITICAL RESULT CALLED TO, READ BACK BY AND VERIFIED WITH: [facility] BORBA AT 1042 08/16/15 DV    Report Status 08/17/2015 FINAL  Final   Organism ID, Bacteria ESCHERICHIA COLI  Final   Organism ID, Bacteria PROTEUS MIRABILIS  Final   Organism ID, Bacteria KLEBSIELLA PNEUMONIAE  Final   Organism ID, Bacteria ENTEROCOCCUS GALLINARUM  Final      Susceptibility   Escherichia coli - MIC*    AMPICILLIN >=32 RESISTANT Resistant     CEFTAZIDIME 4 RESISTANT Resistant     CEFAZOLIN >=64 RESISTANT Resistant     CEFTRIAXONE 16 RESISTANT Resistant     GENTAMICIN 2 SENSITIVE Sensitive     IMIPENEM >=16 RESISTANT Resistant     TRIMETH/SULFA <=20 SENSITIVE Sensitive     Extended ESBL POSITIVE Resistant     PIP/TAZO Value in next row Resistant      RESISTANT>=128    CIPROFLOXACIN Value in next row Sensitive      SENSITIVE<=0.25    * HEAVY GROWTH ESCHERICHIA COLI   Klebsiella pneumoniae - MIC*    AMPICILLIN Value in next row Resistant      SENSITIVE<=0.25    CEFTAZIDIME Value in next row Resistant      SENSITIVE<=0.25    CEFAZOLIN Value in next row Resistant      SENSITIVE<=0.25    CEFTRIAXONE Value in next row Resistant      SENSITIVE<=0.25    CIPROFLOXACIN  Value in next row Resistant      SENSITIVE<=0.25    GENTAMICIN Value in next row Sensitive      SENSITIVE<=0.25    IMIPENEM Value in next row Resistant      SENSITIVE<=0.25    TRIMETH/SULFA Value in next row Resistant      SENSITIVE<=0.25    PIP/TAZO Value in next row Resistant      RESISTANT>=128    * LIGHT GROWTH KLEBSIELLA PNEUMONIAE   Proteus mirabilis - MIC*    AMPICILLIN Value in next row Resistant      RESISTANT>=128    CEFTAZIDIME Value in next row Sensitive      RESISTANT>=128    CEFAZOLIN Value in next row Sensitive      RESISTANT>=128    CEFTRIAXONE Value in next row Sensitive      RESISTANT>=128    CIPROFLOXACIN Value in next row Sensitive      RESISTANT>=128    GENTAMICIN Value in next row Sensitive      RESISTANT>=128    IMIPENEM Value in next row Sensitive      RESISTANT>=128    TRIMETH/SULFA Value in next row Sensitive      RESISTANT>=128    PIP/TAZO Value in next row Sensitive      SENSITIVE<=4    * MODERATE GROWTH PROTEUS MIRABILIS   Enterococcus gallinarum - MIC*    AMPICILLIN Value in next row  Resistant      SENSITIVE<=4    GENTAMICIN SYNERGY Value in next row Sensitive      SENSITIVE<=4    CIPROFLOXACIN Value in next row Resistant      RESISTANT>=8    TETRACYCLINE Value in next row Resistant      RESISTANT>=16    * MODERATE GROWTH ENTEROCOCCUS GALLINARUM  Wound culture     Status: None   Collection Time: 08/15/15  2:53 PM  Result Value Ref Range Status   Specimen Description WOUND  Final   Special Requests NONE  Final   Gram Stain FEW WBC SEEN NO ORGANISMS SEEN   Final   Culture NO GROWTH 3 DAYS  Final   Report Status 08/18/2015 FINAL  Final  C difficile quick scan w PCR reflex     Status: None   Collection Time: 08/17/15 11:34 AM  Result Value Ref Range Status   C Diff antigen NEGATIVE NEGATIVE Final   C Diff toxin NEGATIVE NEGATIVE Final   C Diff interpretation Negative for C. difficile  Final  Stool culture     Status: None    Collection Time: 09/03/15  3:51 PM  Result Value Ref Range Status   Specimen Description STOOL  Final   Special Requests Immunocompromised  Final   Culture   Final    NO SALMONELLA OR SHIGELLA ISOLATED No Pathogenic E. coli detected NO CAMPYLOBACTER DETECTED    Report Status 09/07/2015 FINAL  Final  C difficile quick scan w PCR reflex     Status: None   Collection Time: 09/13/15 12:51 AM  Result Value Ref Range Status   C Diff antigen NEGATIVE NEGATIVE Final   C Diff toxin NEGATIVE NEGATIVE Final   C Diff interpretation Negative for C. difficile  Final  Culture, blood (routine x 2)     Status: None   Collection Time: 09/16/15 11:24 AM  Result Value Ref Range Status   Specimen Description BLOOD LEFT HAND  Final   Special Requests BOTTLES DRAWN AEROBIC AND ANAEROBIC  1CC  Final   Culture NO GROWTH 5 DAYS  Final   Report Status 09/21/2015 FINAL  Final  Culture, blood (routine x 2)     Status: None   Collection Time: 09/16/15 12:23 PM  Result Value Ref Range Status   Specimen Description BLOOD RIGHT HAND  Final   Special Requests BOTTLES DRAWN AEROBIC AND ANAEROBIC  1CC  Final   Culture  Setup Time   Final    GRAM POSITIVE COCCI AEROBIC BOTTLE ONLY CRITICAL RESULT CALLED TO, READ BACK BY AND VERIFIED WITH: TESS THOMAS,RN 09/17/2015 0631 BY JRS.    Culture   Final    ENTEROCOCCUS FAECALIS AEROBIC BOTTLE ONLY VRE HAVE INTRINSIC RESISTANCE TO MOST COMMONLY USED ANTIBIOTICS AND THE ABILITY TO ACQUIRE RESISTANCE TO MOST AVAILABLE ANTIBIOTICS. CRITICAL RESULT CALLED TO, READ BACK BY AND VERIFIED WITH: CHERYL SMITH AT 1610 09/19/15 DV    Report Status 09/21/2015 FINAL  Final   Organism ID, Bacteria ENTEROCOCCUS FAECALIS  Final      Susceptibility   Enterococcus faecalis - MIC*    AMPICILLIN <=2 SENSITIVE Sensitive     LINEZOLID 2 SENSITIVE Sensitive     CIPROFLOXACIN Value in next row Resistant      RESISTANT>=8    TETRACYCLINE Value in next row Resistant      RESISTANT>=16     VANCOMYCIN Value in next row Resistant      RESISTANT>=32    GENTAMICIN SYNERGY Value in next  row Resistant      RESISTANT>=32    * ENTEROCOCCUS FAECALIS  Culture, respiratory (NON-Expectorated)     Status: None   Collection Time: 09/16/15  3:50 PM  Result Value Ref Range Status   Specimen Description TRACHEAL ASPIRATE  Final   Special Requests Immunocompromised  Final   Gram Stain   Final    FEW WBC SEEN GOOD SPECIMEN - 80-90% WBCS RARE GRAM NEGATIVE RODS    Culture LIGHT GROWTH PSEUDOMONAS AERUGINOSA  Final   Report Status 09/19/2015 FINAL  Final   Organism ID, Bacteria PSEUDOMONAS AERUGINOSA  Final      Susceptibility   Pseudomonas aeruginosa - MIC*    CEFTAZIDIME 8 SENSITIVE Sensitive     CIPROFLOXACIN 2 INTERMEDIATE Intermediate     GENTAMICIN >=16 RESISTANT Resistant     IMIPENEM >=16 RESISTANT Resistant     PIP/TAZO Value in next row Sensitive      SENSITIVE32    CEFEPIME Value in next row Sensitive      SENSITIVE8    LEVOFLOXACIN Value in next row Resistant      RESISTANT>=8    * LIGHT GROWTH PSEUDOMONAS AERUGINOSA  Culture, expectorated sputum-assessment     Status: None   Collection Time: 09/22/15  2:09 PM  Result Value Ref Range Status   Specimen Description ENDOTRACHEAL  Final   Special Requests Normal  Final   Sputum evaluation THIS SPECIMEN IS ACCEPTABLE FOR SPUTUM CULTURE  Final   Report Status 09/24/2015 FINAL  Final  Culture, respiratory (NON-Expectorated)     Status: None   Collection Time: 09/22/15  2:09 PM  Result Value Ref Range Status   Specimen Description ENDOTRACHEAL  Final   Special Requests Normal Reflexed from [id number]  Final   Gram Stain   Final    FEW WBC SEEN FEW GRAM NEGATIVE RODS POOR SPECIMEN - LESS THAN 70% WBCS    Culture   Final    MODERATE GROWTH PSEUDOMONAS AERUGINOSA LIGHT GROWTH KLEBSIELLA PNEUMONIAE REFER TO SENSITIVITIES FROM PREVIOUS CULTURE FOR ORG 2    Report Status 10/01/2015 FINAL  Final   Organism ID, Bacteria  PSEUDOMONAS AERUGINOSA  Final   Organism ID, Bacteria KLEBSIELLA PNEUMONIAE  Final      Susceptibility   Pseudomonas aeruginosa - MIC*    CEFTAZIDIME 4 SENSITIVE Sensitive     CIPROFLOXACIN 2 INTERMEDIATE Intermediate     GENTAMICIN >=16 RESISTANT Resistant     IMIPENEM >=16 RESISTANT Resistant     PIP/TAZO Value in next row Sensitive      SENSITIVE16    * MODERATE GROWTH PSEUDOMONAS AERUGINOSA  Tissue culture     Status: None   Collection Time: 09/24/15  6:44 AM  Result Value Ref Range Status   Specimen Description BONE  Final   Special Requests Normal  Final   Gram Stain MODERATE WBC SEEN FEW GRAM NEGATIVE RODS   Final   Culture   Final    MODERATE GROWTH ESCHERICHIA COLI MODERATE GROWTH KLEBSIELLA PNEUMONIAE ESBL-EXTENDED SPECTRUM BETA LACTAMASE-THE ORGANISM IS RESISTANT TO PENICILLINS, CEPHALOSPORINS AND AZTREONAM ACCORDING TO CLSI M100-S15 VOL.25 N01 JAN 2005. ORGANISM 1 This organism isolate is resistant to one or more antiotic agents in three or more antimicrobial categories.  Suggest Infectious Disease consult.   ORGANISM 2 CRITICAL RESULT CALLED TO, READ BACK BY AND VERIFIED WITH: RN Mardene Celeste LINDSAY 09/27/15 1005AM    Report Status 09/28/2015 FINAL  Final   Organism ID, Bacteria ESCHERICHIA COLI  Final   Organism ID, Bacteria KLEBSIELLA  PNEUMONIAE  Final      Susceptibility   Escherichia coli - MIC*    AMPICILLIN >=32 RESISTANT Resistant     CEFTAZIDIME 4 RESISTANT Resistant     CEFAZOLIN >=64 RESISTANT Resistant     CEFTRIAXONE 8 RESISTANT Resistant     GENTAMICIN <=1 SENSITIVE Sensitive     IMIPENEM 8 RESISTANT Resistant     TRIMETH/SULFA <=20 SENSITIVE Sensitive     Extended ESBL POSITIVE Resistant     PIP/TAZO Value in next row Resistant      RESISTANT>=128    * MODERATE GROWTH ESCHERICHIA COLI   Klebsiella pneumoniae - MIC*    AMPICILLIN Value in next row Resistant      RESISTANT>=128    CEFTAZIDIME Value in next row Resistant      RESISTANT>=128     CEFAZOLIN Value in next row Resistant      RESISTANT>=128    CEFTRIAXONE Value in next row Resistant      RESISTANT>=128    CIPROFLOXACIN Value in next row Resistant      RESISTANT>=128    GENTAMICIN Value in next row Resistant      RESISTANT>=128    IMIPENEM Value in next row Resistant      RESISTANT>=128    TRIMETH/SULFA Value in next row Sensitive      RESISTANT>=128    PIP/TAZO Value in next row Resistant      RESISTANT>=128    * MODERATE GROWTH KLEBSIELLA PNEUMONIAE  Anaerobic culture     Status: None   Collection Time: 09/24/15  3:55 PM  Result Value Ref Range Status   Specimen Description BONE  Final   Special Requests Normal  Final   Culture NO ANAEROBES ISOLATED  Final   Report Status 09/29/2015 FINAL  Final  Culture, fungus without smear     Status: None   Collection Time: 09/24/15  3:55 PM  Result Value Ref Range Status   Specimen Description BONE  Final   Special Requests Normal  Final   Culture NO FUNGUS ISOLATED AFTER 24 DAYS  Final   Report Status 10/18/2015 FINAL  Final  C difficile quick scan w PCR reflex     Status: None   Collection Time: 10/07/15  2:02 PM  Result Value Ref Range Status   C Diff antigen NEGATIVE NEGATIVE Final   C Diff toxin NEGATIVE NEGATIVE Final   C Diff interpretation Negative for C. difficile  Final  Culture, blood (routine x 2)     Status: None   Collection Time: 10/16/15  9:52 AM  Result Value Ref Range Status   Specimen Description BLOOD LEFT HAND  Final   Special Requests   Final    BOTTLES DRAWN AEROBIC AND ANAEROBIC  AER 6CC ANA 4CC   Culture NO GROWTH 6 DAYS  Final   Report Status 10/22/2015 FINAL  Final  Culture, blood (routine x 2)     Status: None   Collection Time: 10/16/15 12:25 PM  Result Value Ref Range Status   Specimen Description BLOOD LEFT HAND  Final   Special Requests BOTTLES DRAWN AEROBIC AND ANAEROBIC  5CC  Final   Culture NO GROWTH 6 DAYS  Final   Report Status 10/22/2015 FINAL  Final  Culture,  expectorated sputum-assessment     Status: None   Collection Time: 10/18/15  1:15 PM  Result Value Ref Range Status   Specimen Description SPUTUM  Final   Special Requests NONE  Final   Sputum evaluation THIS SPECIMEN IS  ACCEPTABLE FOR SPUTUM CULTURE  Final   Report Status 10/18/2015 FINAL  Final  Culture, respiratory (NON-Expectorated)     Status: None   Collection Time: 10/18/15  1:15 PM  Result Value Ref Range Status   Specimen Description SPUTUM  Final   Special Requests NONE Reflexed from [id number]  Final   Gram Stain   Final    GOOD SPECIMEN - 80-90% WBCS MODERATE WBC SEEN MANY GRAM NEGATIVE RODS    Culture   Final    HEAVY GROWTH PSEUDOMONAS AERUGINOSA MODERATE GROWTH KLEBSIELLA PNEUMONIAE CRITICAL RESULT CALLED TO, READ BACK BY AND VERIFIED WITHRod Mae AT 7829 10/21/15 DV KLEBSIELLA PNEUMONIAE This organism isolate is resistant to one or more antiotic agents in three or more antimicrobial categories.  Suggest Infectious Disease  consult.      Report Status 10/22/2015 FINAL  Final   Organism ID, Bacteria PSEUDOMONAS AERUGINOSA  Final   Organism ID, Bacteria KLEBSIELLA PNEUMONIAE  Final      Susceptibility   Klebsiella pneumoniae - MIC*    AMPICILLIN >=32 RESISTANT Resistant     CEFAZOLIN >=64 RESISTANT Resistant     CEFTRIAXONE >=64 RESISTANT Resistant     CIPROFLOXACIN >=4 RESISTANT Resistant     GENTAMICIN >=16 RESISTANT Resistant     IMIPENEM >=16 RESISTANT Resistant     NITROFURANTOIN >=512 RESISTANT Resistant     TRIMETH/SULFA <=20 SENSITIVE Sensitive     * MODERATE GROWTH KLEBSIELLA PNEUMONIAE   Pseudomonas aeruginosa - MIC*    CEFTAZIDIME 4 SENSITIVE Sensitive     CIPROFLOXACIN >=4 RESISTANT Resistant     GENTAMICIN 8 INTERMEDIATE Intermediate     IMIPENEM >=16 RESISTANT Resistant     PIP/TAZO Value in next row Sensitive      SENSITIVE32    * HEAVY GROWTH PSEUDOMONAS AERUGINOSA  C difficile quick scan w PCR reflex     Status: None   Collection Time:  10/18/15  4:39 PM  Result Value Ref Range Status   C Diff antigen NEGATIVE NEGATIVE Final   C Diff toxin NEGATIVE NEGATIVE Final   C Diff interpretation Negative for C. difficile  Final  Culture, blood (Routine X 2) w Reflex to ID Panel     Status: None   Collection Time: 11/06/15  8:27 AM  Result Value Ref Range Status   Specimen Description BLOOD RIGHT HAND  Final   Special Requests BOTTLES DRAWN AEROBIC AND ANAEROBIC 3CC  Final   Culture NO GROWTH 5 DAYS  Final   Report Status 11/11/2015 FINAL  Final  Culture, blood (Routine X 2) w Reflex to ID Panel     Status: None   Collection Time: 11/06/15  8:29 AM  Result Value Ref Range Status   Specimen Description BLOOD LEFT HAND  Final   Special Requests BOTTLES DRAWN AEROBIC AND ANAEROBIC  1CC  Final   Culture NO GROWTH 5 DAYS  Final   Report Status 11/11/2015 FINAL  Final  Culture, expectorated sputum-assessment     Status: None   Collection Time: 11/06/15  8:41 AM  Result Value Ref Range Status   Specimen Description EXPECTORATED SPUTUM  Final   Special Requests Immunocompromised  Final   Sputum evaluation THIS SPECIMEN IS ACCEPTABLE FOR SPUTUM CULTURE  Final   Report Status 11/06/2015 FINAL  Final  Culture, respiratory (NON-Expectorated)     Status: None   Collection Time: 11/06/15  8:41 AM  Result Value Ref Range Status   Specimen Description EXPECTORATED SPUTUM  Final   Special  Requests Immunocompromised Reflexed from [id number]  Final   Gram Stain   Final    MANY WBC SEEN MANY GRAM NEGATIVE RODS EXCELLENT SPECIMEN - 90-100% WBCS    Culture   Final    MODERATE GROWTH KLEBSIELLA PNEUMONIAE MODERATE GROWTH PSEUDOMONAS AERUGINOSA CRITICAL RESULT CALLED TO, READ BACK BY AND VERIFIED WITH: DR. FITZGERALD AT 1120 11/09/15 DV DR. FITZGERALD REQUESTED ORGANISM BE SENT OUT FOR TIGECYCLINE CEFT/AVIBACTAM AND COLISTIN Sent to [facility] for further susceptibility testing. 11/11/15 CTJ    Report Status 11/15/2015 FINAL  Final   Organism  ID, Bacteria KLEBSIELLA PNEUMONIAE  Final   Organism ID, Bacteria PSEUDOMONAS AERUGINOSA  Final      Susceptibility   Klebsiella pneumoniae - MIC*    AMPICILLIN >=32 RESISTANT Resistant     CEFAZOLIN >=64 RESISTANT Resistant     CEFTRIAXONE 32 RESISTANT Resistant     CIPROFLOXACIN >=4 RESISTANT Resistant     GENTAMICIN >=16 RESISTANT Resistant     IMIPENEM >=16 RESISTANT Resistant     NITROFURANTOIN 256 RESISTANT Resistant     TRIMETH/SULFA >=320 RESISTANT Resistant     Extended ESBL POSITIVE Resistant     * MODERATE GROWTH KLEBSIELLA PNEUMONIAE   Pseudomonas aeruginosa - MIC*    CEFTAZIDIME >=64 RESISTANT Resistant     CIPROFLOXACIN 2 INTERMEDIATE Intermediate     GENTAMICIN >=16 RESISTANT Resistant     IMIPENEM >=16 RESISTANT Resistant     * MODERATE GROWTH PSEUDOMONAS AERUGINOSA  Culture, respiratory (NON-Expectorated)     Status: None   Collection Time: 11/14/15  2:07 PM  Result Value Ref Range Status   Specimen Description TRACHEAL ASPIRATE  Final   Special Requests NONE  Final   Gram Stain   Final    EXCELLENT SPECIMEN - 90-100% WBCS MANY WBC SEEN MODERATE GRAM NEGATIVE RODS    Culture   Final    HEAVY GROWTH PSEUDOMONAS AERUGINOSA CRITICAL VALUE NOTED.  VALUE IS CONSISTENT WITH PREVIOUSLY REPORTED AND CALLED VALUE.    Report Status 11/16/2015 FINAL  Final   Organism ID, Bacteria PSEUDOMONAS AERUGINOSA  Final      Susceptibility   Pseudomonas aeruginosa - MIC*    CEFTAZIDIME >=64 RESISTANT Resistant     CIPROFLOXACIN 2 INTERMEDIATE Intermediate     GENTAMICIN >=16 RESISTANT Resistant     IMIPENEM >=16 RESISTANT Resistant     PIP/TAZO Value in next row Resistant      RESISTANT>=128    TOBRAMYCIN Value in next row Sensitive      SENSITIVE2    * HEAVY GROWTH PSEUDOMONAS AERUGINOSA    Coagulation Studies: No results for input(s): LABPROT, INR in the last 72 hours.  Urinalysis: No results for input(s): COLORURINE, LABSPEC, PHURINE, GLUCOSEU, HGBUR, BILIRUBINUR,  KETONESUR, PROTEINUR, UROBILINOGEN, NITRITE, LEUKOCYTESUR in the last 72 hours.  Invalid input(s): APPERANCEUR    Imaging: Dg Chest Port 1 View  12/05/2015  CLINICAL DATA:  Respiratory failure. EXAM: PORTABLE CHEST 1 VIEW COMPARISON:  11/21/2015. FINDINGS: Tracheostomy tube, left IJ line, right IJ dual-lumen catheter, and feeding tube in stable position. Persistent cardiomegaly . Diffuse bilateral pulmonary infiltrates again noted. Slight interim improvement. No pleural effusion or pneumothorax. IMPRESSION: 1. Lines and tubes in stable position. 2. Cardiomegaly with diffuse bilateral pulmonary infiltrates again noted. Slight improvement. Findings suggesting slight improvement of pulmonary edema and/or pneumonia. Electronically Signed   By: Maisie Fus  Register   On: 12/05/2015 07:39     Medications:   . dextrose 25 mL/hr at 12/04/15 0555  . feeding supplement (VITAL AF  1.2 CAL) 1,000 mL (12/04/15 0908)  . norepinephrine (LEVOPHED) Adult infusion 3 mcg/min (12/05/15 0630)   . albuterol  2.5 mg Nebulization Q6H  . amiodarone  100 mg Per Tube Daily  . antiseptic oral rinse  7 mL Mouth Rinse BID  . antiseptic oral rinse  7 mL Mouth Rinse QID  . aspirin  81 mg Per Tube Daily  . chlorhexidine gluconate  15 mL Mouth Rinse BID  . colistimethate (COLISTIN) IV  250 mg Intravenous Q48H  . epoetin (EPOGEN/PROCRIT) injection  10,000 Units Intravenous Q M,W,F-HD  . escitalopram  10 mg Per Tube Daily  . feeding supplement (PRO-STAT SUGAR FREE 64)  30 mL Per Tube 6 X Daily  . free water  20 mL Per Tube 6 times per day  . heparin subcutaneous  5,000 Units Subcutaneous Q12H  . hydrocortisone  10 mg Per Tube BID  . lidocaine  1 patch Transdermal Q24H  . midodrine  10 mg Per Tube TID WC  . mirtazapine  7.5 mg Per Tube QHS  . multivitamin  5 mL Per Tube Daily  . pantoprazole sodium  40 mg Per Tube Daily  . sodium chloride  10-40 mL Intracatheter Q12H  . sodium hypochlorite   Irrigation BID  . tigecycline  (TYGACIL) IVPB  50 mg Intravenous Q12H  . tobramycin (PF)  300 mg Nebulization BID   acetaminophen (TYLENOL) oral liquid 160 mg/5 mL, albuterol, HYDROmorphone (DILAUDID) injection, loperamide, ondansetron (ZOFRAN) IV, oxyCODONE, polyvinyl alcohol, sodium chloride, zinc oxide  Assessment/ Plan:  50 y.o. 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 Admitted on 08/10/2015  Echo: 11/07/19: EF of 30% to 35%.   1. End-stage renal disease on hemodialysis on HD MWF. The patient has been on dialysis since October of 2014. R IJ permcath.  - Did not do well with HD this past Friday. No fluid coud be removed despite iv albumin, decreased temp and increased dose of iv levophed - Patient due for hemodialysis today. We will continue to use low blood flow rates along with pressor support during dialysis. Unclear if she will be able to tolerate any ultrafiltration.  2. Anemia of CKD. Hemoglobin 8.5. Multiple transfusions during admission   - Hemoglobin currently down to 7.2. Consider transfusion for hemoglobin of 7 or less.  3. Acute resp failure: ventilator dependent -Remains on the ventilator. FiO2 35% with PEEP of 5.   4. Anasarca - Patient unable to tolerate ultra filtration with dialysis on Friday. Target today is 1 kg however unclear if she will be able to tolerate this as well.  5. Other:  - Sepsis:  Multi drug resistant organism -  large st 4 decubitus - poor nutrition - not a candidate for outpatient dialysis.         LOS: 115 Kristoffer Bala 1/16/20179:43 AM

## 2015-12-05 NOTE — Progress Notes (Signed)
Hemodialysis complete 

## 2015-12-05 NOTE — Progress Notes (Signed)
Increased Levophed to 6 mcg per hour to support MAP goal during dialysis.

## 2015-12-05 NOTE — Progress Notes (Signed)
Pre-hd tx 

## 2015-12-05 NOTE — Progress Notes (Signed)
Called by RN for pt vent alarms. Pt PIP 47-52, ETCO2 61. Suctioned pt trach, small amount of mucus, changed HME, bagged pt with 100% AMBU with ease, no plugging noted. Pt still has agonal respirations, Spo2 98%, RR 20. RN aware of interventions.

## 2015-12-05 NOTE — Progress Notes (Signed)
MEDICATION RELATED CONSULT NOTE - Follow up   Pharmacy Consult for Renal dosing/HD  Allergies  Allergen Reactions  . Contrast Media [Iodinated Diagnostic Agents] Anaphylaxis  . Ampicillin Rash   Patient Measurements: Ht 515ft 9in Height:  (SCALE BROKE) Weight:  (Beds scale broken) IBW/kg (Calculated) : 66.2  Vital Signs: Temp: 97.8 F (36.6 C) (01/16 1306) Temp Source: Axillary (01/16 1306) BP: 121/58 mmHg (01/16 1306) Pulse Rate: 78 (01/16 1306)  Recent Labs  12/02/15 1609 12/03/15 0552 12/05/15 0538  WBC 13.5* 15.5* 15.1*  HGB 8.8* 8.5* 7.2*  HCT 30.4* 28.0* 23.7*  PLT 58* 58* 62*  CREATININE 1.34*  --  1.36*  MG  --   --  2.0  PHOS 5.6*  --  5.4*  ALBUMIN 1.6*  --  1.7*  PROT  --   --  4.9*  AST  --   --  43*  ALT  --   --  34  ALKPHOS  --   --  2594*  BILITOT  --   --  1.2   Estimated Creatinine Clearance: 62.9 mL/min (by C-G formula based on Cr of 1.36).  Assessment: 50 yo patient on HD MWF. Pharmacy consulted for renal dosing of medications.   Plan:  No medications require adjustment at present. Pharmacy will continue to monitor and dose adjust as needed.    Luisa HartScott Cylas Falzone, PharmD Clinical Pharmacist  Clinical Pharmacist 12/05/2015

## 2015-12-05 NOTE — Progress Notes (Signed)
Pt is agonal breathing. Lungs are rhonchus. Pt has been suctioned by this RN and the RT. RT attempted bagging the pt, no mucus plug present. Sats remain 98/99%. This RN has had to continue titrating up on the Levophed to maintain MAP of 65. Will continue to monitor.

## 2015-12-05 NOTE — Progress Notes (Signed)
PULMONARY / CRITICAL CARE MEDICINE   Name: Courtney [name] MRN: [id number] DOB: January 02, 1966    ADMISSION DATE:  07/26/2015  BRIEF HISTORY: 49 AAF who has been in medical facilities (hosp, LTAC, rehab) for 2 yrs following gastric bypass surgery with multiple complications. Now with chronic trach, ESRD, profound debilitation, severe sacral pressure ulcer. Was seemingly making progress and transferred to rehab facility approx one week prior to this admission. She was sent to [facility] ED with AMS and hypotension. Working dx of severe sepsis/septic shock due to infected sacral pressure ulcer. Since admission. Her course is been very complicated with numerous complications including septic shock and GI bleeding. Now with failure to wean from vent and possible MI event, repeat ECHO EF 30-35, possible old MI, severe hypokinesis of the anterior and anteroseptal myocardium   SUBJ:   The patient is minimally responsive today. She opens eyes but does not follow commands. Her breathing appears dyspneic. Platelets are low today, heparin is stopped.  Summary of MAJOR EVENTS/TEST RESULTS: Admission 02/07/14-05/07/14 Admission 07/21/14-09/06/14 Discharged to [facility]. Pt had palliative consult at that time, were asked to sign off by husband.  09/23 CT head: NAD 09/23 EEG: no epileptiform activity 09/23 PRBCs for Hgb 6.4 09/24 bedside debridement of sacral wound. Abscess drained 09/25 Off vasopressors. More alert. No distress. Worsening thrombocytopenia. Vanc DC'd 09/29 Dr. Sampson Goon (I.D) excused from the case by patient's husband. 10/02 MRI -multiple infarcts 10/03 tracheal bleeding- transfused platelets 10/03 hospitalist service excused from the case by patient's husband 10/03 Echocardiogram ejection fraction was 55-60%, pulmonary systolic pressure was 39 mmHg 40/98 restart TF's at lower rate, attempt reg HD  10/12 Transferred to med-surg floor. Remains on PCCM service 10/14 SLP eval: pt unable to  tolerate PMV adequately 10/17-will re-attempt PM valve-discussed with Speech therapist 10/18 passed swallow eval-start pureed thick foods no thin liquids-continue NG feeds 10/19 transferred to step down for sepsis/aspiration pneumonia 10/19 cxr shows RLL opacity 10/21 sacral decub debride at bedside by surgery 10/22 started back on vasopressors while on HD 10/27 CT with osteo, R hip fx, unable to identify tip of dubhoff tube - sent for fluoro study 10/28 Ortho consultation: I do not feel that she is a surgical candidate. Therefore, I feel that it would best to manage this fracture nonsurgically and allow it to heal by itself over time, which it should.  10/28 Gen Surg consultation: Due to the lack of free air or free flow of contrast and the peritoneum there is no indication for any surgical intervention on this. Would recommend pulling back the feeding tube 1-2 cm. Would recheck an abdominal film to confirm no pneumoperitoneum in the morning. Absence of any changes okay to continue using Dobbhoff for feeding and medications. 10/28 gastrograffin study: The study confirms that the feeding tube pes perforated through the duodenum and the tip is within a cavity that fills with injected contrast. The cavity does appear walled off 11/4 refuses oral feeds, continue TF's CT reviewed: T8-T9 discitis/osteomyelitis, RT HIP FRACTURE 11/5 placed back on vasopressors, placed back on Vent due to resp acidosis, levophed turned off 11/6 afternoon 11/5 PM Trach changed out due to cuff leak, #6 Shiley cuffed, 11/6 dubhoff occluded, removed and replaced, new dubhoff shows tube in the antral stomach 11/15 refusing to take oral feeds 11/18 placed back on Vent for increased WOB,SOB. 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 10/18/2015; the patient developed a new  left basal pneumonia; with recurrent sepsis.  10/19/2015; fevers resolved  with IV acetaminophen. Tube feeds changed from vital to Jevity.  12/7 CXR with stable b/l opacities.  12/12 elevated K s/p HD-remains on AC mode 12/16-vomiting-hold feeds and re-assess in next 24 hrs 12/17- TF restarted at 1/2 goal (goal = 50cc/per) 12/18- elevated wbc, fever (101.4), recultured, restarted on ceftaz 12/19 Pericardial friction rub. Echocardiogram: LVEF 30-35%, anteroseptal hypokinesis or akinesis c/w anterior wall MI 12/20 Dr Sung Amabile discussed new echocardiogram findings with pt's husband and explained that this likely represents an anterior wall MI sometime in the previous couple of weeks. It was explained that she is not a candidate for any cardiology intervention. Code status was again discussed. Husband expressed understanding that she was not going to survive this illness in any favorable way but continues to request that she be full code status based on what he believes to be her wishes 12/21 vomitied 50cc of TF - AM TF held, restart TF at 3pm @30cc  12/26 Persistent intermittent trach cuff leak. Distal XLT trach requested.  12/27 15 beat NSVT 12/28 patient refused HD and refused Bath time 12/29 D10 infusion for hypoglycemia, TF's on hold due to intolerance 12/30 CODE BLUE  VTACH 01/01 transfuse 2 units of blood 01/02 increased respiratory distress, copious purulent secretions 01/02 LE venous US: No DVT 01/03 Repeat TTE: The cavity size was mildly dilated. Systolic function was moderately to severely reduced. The estimated ejection fraction was in the range of 30% to 35%. Diffuse hypokinesis. Moderate to severe hypokinesis of the anteroseptal, anterior, and apical myocardium. Hypokinesis of the anterior myocardium 01/05 Hypothermic - Bair Hugger ordered 01/10-placed back on vasopressors for septic shock 1/11-failed SBT due to resp muscle fatigue 1/13-worsening shock and hypoxia, agonal respirations  INDWELLING DEVICES:: Trach (chronic) placed June 2014 Tunneled R IJ  HD cath (chronic) Tunneled L IJ CVL (chronic) L femoral A-line 9/23 >> 9/25  MICRO DATA: 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 Pneumoniae, P. Mirabilis, VRE Wound 9/26 >> No growth Blood 9/23 >> NEG CDiff 9/27>>neg Stool Cx 10/15>> negative Cdiff 10/25>>neg Trach Aspirate 10/28>> light growth pseudomonas Blood 10/28 >> 1/2 GPC >>  Sputum cultures obtained 09/22/15 due to mucus plugging>>Pseudomonas BONE TISSUE Cx 11/3>>E.coli (ESBL), k. Pneumonia (daptomycin and septra) Bld Cx 11/27>>negative thus far.  Trach Asp 11/27>> grew out Pseudomonas, and Klebsiella pneumonia. Trach Asp 12/18>> MDR Klebsiella, Pseudomonas Bld Cx 12/18>> NEG 12/26 resp culture >> MDR pseudomonas  ANTIMICROBIALS:  Aztreonam 9/23 >> 9/24 Vanc 9/23 >> 9/25 Vanc 9/26>>9/27 Daptomycin 9/27>> 10/12, 10/28>> off Meropenem 9/23 >> 10/14, 10/28 >> 10/31 Zosyn 11/27,>> 11/30. Levaquin 11/29>> 11/29. Ceftaz 12/1>>12/11, 12/18>> 12/24 Colisitin 12/26 >>  Tigecycline 12/26 >>  Nebulized tobramycin 12/30 >>  VITAL SIGNS: Temp:  [97.6 F (36.4 C)-97.8 F (36.6 C)] 97.7 F (36.5 C) (01/16 0945) Pulse Rate:  [36-83] 76 (01/16 1100) Resp:  [0-21] 16 (01/16 1100) BP: (90-171)/(35-69) 108/36 mmHg (01/16 1100) SpO2:  [95 %-100 %] 95 % (01/16 1100) FiO2 (%):  [35 %] 35 % (01/16 0843) HEMODYNAMICS:   VENTILATOR SETTINGS: Vent Mode:  [-] PRVC FiO2 (%):  [35 %] 35 % Set Rate:  [14 bmp] 14 bmp Vt Set:  [500 mL] 500 mL PEEP:  [5 cmH20-8 cmH20] 5 cmH20 Plateau Pressure:  [21 cmH20] 21 cmH20 INTAKE / OUTPUT:  Intake/Output Summary (Last 24 hours) at 12/05/15 1114 Last data filed at 12/05/15 1000  Gross per 24 hour  Intake 1912.49 ml  Output      0 ml  Net 1912.49 ml    Review of Systems  Unable to perform  ROS: critical illness   Physical Exam  Constitutional:  Critical ill appearance  HENT:  Head: Normocephalic and atraumatic.  Right Ear: External ear normal.  Left Ear: External ear normal.  Eyes: Conjunctivae are normal. Pupils are equal, round, and reactive to light. Right eye exhibits no discharge. Left eye exhibits no discharge.  Neck: Neck supple. No tracheal deviation present. No thyromegaly present.  Cardiovascular: Regular rhythm, normal heart sounds and intact distal pulses.   No murmur heard. Pulmonary/Chest: She has no wheezes.  Tracheostomy - on MV  Abdominal: Soft. Bowel sounds are normal. She exhibits no distension.  Musculoskeletal: She exhibits edema.  Ext cool, trace symmetric edema, marked muscle wasting Severe sacral decubitus ulcer- to the spine  Skin: Skin is warm.  Nursing note and vitals reviewed.   RASS 0, intermittent trach tube cuff leak,vent graphics look good, no air leak  HEENT; Cushingoid facies, temporal wasting, NGT present. Tube feeds are infusing. Trach site clean Diffuse rhonchi Reg with occ extrasystoles, no M Abdomen soft, + BS Ext cool, trace symmetric edema, marked muscle wasting Severe sacral decubitus ulcer- to the spine  LABS:  CBC  Recent Labs Lab 12/02/15 1609 12/03/15 0552 12/05/15 0538  WBC 13.5* 15.5* 15.1*  HGB 8.8* 8.5* 7.2*  HCT 30.4* 28.0* 23.7*  PLT 58* 58* 62*   Coag's No results for input(s): APTT, INR in the last 168 hours. BMET  Recent Labs Lab 11/30/15 1633 12/02/15 1609 12/05/15 0538  NA 129* 132* 131*  K 3.9 3.7 3.9  CL 97* 96* 96*  CO2 27 29 29   BUN 95* 95* 106*  CREATININE 1.38* 1.34* 1.36*  GLUCOSE 140* 186* 124*   Electrolytes  Recent Labs Lab 11/28/15 1348 11/30/15 1633 12/02/15 1609 12/05/15 0538  CALCIUM 8.0* 8.0* 8.2* 8.0*  MG  --   --   --  2.0  PHOS 6.1*  --  5.6* 5.4*   Sepsis Markers No results for input(s): LATICACIDVEN, PROCALCITON, O2SATVEN in the last 168  hours. ABG No results for input(s): PHART, PCO2ART, PO2ART in the last 168 hours. Liver Enzymes  Recent Labs Lab 11/28/15 1348 12/02/15 1609 12/05/15 0538  AST  --   --  43*  ALT  --   --  34  ALKPHOS  --   --  2594*  BILITOT  --   --  1.2  ALBUMIN 1.6* 1.6* 1.7*   Cardiac Enzymes No results for input(s): TROPONINI, PROBNP in the last 168 hours. Glucose  Recent Labs Lab 12/03/15 1604 12/03/15 2340 12/04/15 0740 12/04/15 1551 12/04/15 2332 12/05/15 0750  GLUCAP 104* 146* 145* 151* 123* 133*    Imaging Dg Chest Port 1 View  12/05/2015  CLINICAL DATA:  Respiratory failure. EXAM: PORTABLE CHEST 1 VIEW COMPARISON:  11/21/2015. FINDINGS: Tracheostomy tube, left IJ line, right IJ dual-lumen catheter, and feeding tube in stable position. Persistent cardiomegaly . Diffuse bilateral pulmonary infiltrates again noted. Slight interim improvement. No pleural effusion or pneumothorax. IMPRESSION: 1. Lines and tubes in stable position. 2. Cardiomegaly with diffuse bilateral pulmonary infiltrates again noted. Slight improvement. Findings suggesting slight improvement of pulmonary edema and/or pneumonia. Electronically Signed   By: Maisie Fushomas  Register   On: 12/05/2015 07:39    ASSESSMENT /  PLAN: IMPRESSION: Very prolonged and complicated hospitalization after gastric bypass surgery 2014 Never returned home ESRD-hd as tolerated Recurrent severe sepsis - PAN resistant Klebisella in sputum Recurrent PNA-now with pseudomonas H/O C diff H/O VTE DM2 - controlled Episodic hypoglycemia Chronic steroid therapy, for a history of of adrenal insufficiency Incidental finding of R hip fx - conservative mgmt. 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 Husband declined TEE Profound deconditioning Chronic pain New finding of pericardial friction rub 12/19 - resolved New finding of cardiomyopathy, likely post MI 12/19  PAF/flutter Sinus bradycardia HCAP with MDR Donalee Citrin and pseudomonas Chronic anemia Severe protein - calorie malnutrition TF intolerance Thrombocytopenia 01/13 - may need to consider stopping heparin and ASA if plts continues to be low on lab recheck in the AM.  Cont vent support - settings reviewed and/or adjusted Cont vent bundle Cont HD per renal Monitor BMET intermittently Monitor I/Os Correct electrolytes as indicated DVT px: SQ heparin Monitor CBC intermittently Transfuse per usual guidelines Monitor temp, WBC count Micro and abx as above ID following - appreciate recs Cont Amiodarone - 100 mg per tube daily DC metoprolol Cont opiates PRN Cont D10 Infusion for hypoglycemia TFs @  30 cc/hr Vasopressors to keep MAP>60   Updated husband via telephone  - he is updated about the patient's poor respiratory and neuro status. 2. Continue the current course of therapy.  Prognosis is very poor without hope for functional recovery. Patient is dialysis and Vent dependent with severe sacral decub ulcer with chronic bone infection and spinal infection with underlying CHF and CVA with recurrent pneumonia.   This patient in my opinion is deemed Futile. Husband understands but unrealistic about goals of care   Wells Guiles, M.D.   Critical Care Attestation.  I have personally obtained a history, examined the patient, evaluated laboratory and imaging results, formulated the assessment and plan and placed orders. The Patient requires high complexity decision making for assessment and support, frequent evaluation and titration of therapies, application of advanced monitoring technologies and extensive interpretation of multiple databases. The patient  has critical illness that could lead imminently to failure of 1 or more organ systems and requires the highest level of physician preparedness to intervene.  Critical Care Time devoted to patient care services described in this note is 35 minutes and is exclusive of time spent in procedures.

## 2015-12-05 NOTE — Progress Notes (Signed)
This note also relates to the following rows which could not be included: Pulse Rate - Cannot attach notes to unvalidated device data Resp - Cannot attach notes to unvalidated device data BP - Cannot attach notes to unvalidated device data SpO2 - Cannot attach notes to unvalidated device data End Tidal CO2 (EtCO2) - Cannot attach notes to unvalidated device data   Hemodialysis start

## 2015-12-06 LAB — GLUCOSE, CAPILLARY
GLUCOSE-CAPILLARY: 146 mg/dL — AB (ref 65–99)
GLUCOSE-CAPILLARY: 150 mg/dL — AB (ref 65–99)
Glucose-Capillary: 125 mg/dL — ABNORMAL HIGH (ref 65–99)

## 2015-12-06 MED ORDER — TOBRAMYCIN 300 MG/5ML IN NEBU
300.0000 mg | INHALATION_SOLUTION | Freq: Two times a day (BID) | RESPIRATORY_TRACT | Status: DC
  Administered 2015-12-07 – 2015-12-09 (×4): 300 mg via RESPIRATORY_TRACT
  Filled 2015-12-06 (×12): qty 5

## 2015-12-06 MED ORDER — ZINC OXIDE 20 % EX OINT
TOPICAL_OINTMENT | CUTANEOUS | Status: DC | PRN
  Filled 2015-12-06: qty 28.35

## 2015-12-06 MED ORDER — OXYCODONE HCL 5 MG/5ML PO SOLN
5.0000 mg | Freq: Four times a day (QID) | ORAL | Status: DC | PRN
  Administered 2015-12-07 – 2015-12-09 (×3): 5 mg
  Filled 2015-12-06 (×3): qty 5

## 2015-12-06 MED ORDER — MIRTAZAPINE 15 MG PO TBDP
7.5000 mg | ORAL_TABLET | Freq: Every day | ORAL | Status: DC
  Administered 2015-12-07: 7.5 mg
  Administered 2015-12-07: 15 mg
  Administered 2015-12-08: 7.5 mg
  Filled 2015-12-06 (×3): qty 1

## 2015-12-06 MED ORDER — MIDODRINE HCL 5 MG PO TABS
10.0000 mg | ORAL_TABLET | Freq: Three times a day (TID) | ORAL | Status: DC
  Administered 2015-12-07 – 2015-12-09 (×7): 10 mg
  Filled 2015-12-06 (×7): qty 2

## 2015-12-06 MED ORDER — VITAL AF 1.2 CAL PO LIQD
1000.0000 mL | ORAL | Status: DC
  Administered 2015-12-07 – 2015-12-08 (×3): 1000 mL

## 2015-12-06 MED ORDER — SODIUM CHLORIDE 0.9 % IJ SOLN
10.0000 mL | Freq: Two times a day (BID) | INTRAMUSCULAR | Status: DC
  Administered 2015-12-07 – 2015-12-08 (×2): 10 mL

## 2015-12-06 MED ORDER — TIGECYCLINE 50 MG IV SOLR
50.0000 mg | Freq: Two times a day (BID) | INTRAVENOUS | Status: DC
Start: 2015-12-07 — End: 2015-12-09
  Administered 2015-12-07 – 2015-12-09 (×5): 50 mg via INTRAVENOUS
  Filled 2015-12-06 (×6): qty 100

## 2015-12-06 MED ORDER — POLYVINYL ALCOHOL 1.4 % OP SOLN
1.0000 [drp] | OPHTHALMIC | Status: DC | PRN
  Filled 2015-12-06: qty 15

## 2015-12-06 MED ORDER — HYDROMORPHONE HCL 1 MG/ML IJ SOLN
0.5000 mg | INTRAMUSCULAR | Status: DC | PRN
  Administered 2015-12-07 – 2015-12-08 (×3): 1 mg via INTRAVENOUS
  Filled 2015-12-06 (×3): qty 1

## 2015-12-06 MED ORDER — CHLORHEXIDINE GLUCONATE 0.12% ORAL RINSE (MEDLINE KIT)
15.0000 mL | Freq: Two times a day (BID) | OROMUCOSAL | Status: DC
  Administered 2015-12-07 – 2015-12-08 (×4): 15 mL via OROMUCOSAL
  Filled 2015-12-06 (×7): qty 15

## 2015-12-06 MED ORDER — SODIUM CHLORIDE 0.9 % IV SOLN
250.0000 mg | INTRAVENOUS | Status: DC
  Administered 2015-12-08: 250 mg via INTRAVENOUS
  Filled 2015-12-06: qty 3.33

## 2015-12-06 MED ORDER — LOPERAMIDE HCL 2 MG PO CAPS
2.0000 mg | ORAL_CAPSULE | ORAL | Status: DC | PRN
  Administered 2015-12-07 – 2015-12-09 (×3): 2 mg via ORAL
  Filled 2015-12-06 (×4): qty 1

## 2015-12-06 MED ORDER — CETYLPYRIDINIUM CHLORIDE 0.05 % MT LIQD
7.0000 mL | Freq: Two times a day (BID) | OROMUCOSAL | Status: DC
  Administered 2015-12-07 – 2015-12-08 (×2): 7 mL via OROMUCOSAL

## 2015-12-06 MED ORDER — NOREPINEPHRINE BITARTRATE 1 MG/ML IV SOLN
0.0000 ug/min | INTRAVENOUS | Status: DC
  Administered 2015-12-07: 15 ug/min via INTRAVENOUS
  Administered 2015-12-08: 35 ug/min via INTRAVENOUS
  Administered 2015-12-08 (×2): 25 ug/min via INTRAVENOUS
  Administered 2015-12-09: 33 ug/min via INTRAVENOUS
  Filled 2015-12-06 (×6): qty 16

## 2015-12-06 MED ORDER — SODIUM CHLORIDE 0.9 % IJ SOLN: 10.0000 mL | INTRAMUSCULAR | Status: DC | PRN

## 2015-12-06 MED ORDER — HYDROCORTISONE 10 MG PO TABS
10.0000 mg | ORAL_TABLET | Freq: Two times a day (BID) | ORAL | Status: DC
  Administered 2015-12-07 – 2015-12-08 (×5): 10 mg
  Filled 2015-12-06 (×5): qty 1

## 2015-12-06 MED ORDER — DAKINS (1/4 STRENGTH) 0.125 % EX SOLN
Freq: Two times a day (BID) | CUTANEOUS | Status: DC
  Administered 2015-12-07 – 2015-12-08 (×4)
  Filled 2015-12-06 (×2): qty 473

## 2015-12-06 MED ORDER — FREE WATER
20.0000 mL | Status: DC
Start: 2015-12-07 — End: 2015-12-09
  Administered 2015-12-07 – 2015-12-09 (×15): 20 mL

## 2015-12-06 MED ORDER — EPOETIN ALFA 10000 UNIT/ML IJ SOLN
10000.0000 [IU] | INTRAMUSCULAR | Status: DC
  Administered 2015-12-07: 10000 [IU] via INTRAVENOUS
  Filled 2015-12-06 (×2): qty 1

## 2015-12-06 MED ORDER — PRO-STAT SUGAR FREE PO LIQD
30.0000 mL | Freq: Every day | ORAL | Status: DC
  Administered 2015-12-07 – 2015-12-09 (×14): 30 mL

## 2015-12-06 MED ORDER — ESCITALOPRAM OXALATE 10 MG PO TABS
10.0000 mg | ORAL_TABLET | Freq: Every day | ORAL | Status: DC
  Administered 2015-12-07 – 2015-12-08 (×2): 10 mg
  Filled 2015-12-06 (×2): qty 1

## 2015-12-06 MED ORDER — AMIODARONE HCL 200 MG PO TABS
100.0000 mg | ORAL_TABLET | Freq: Every day | ORAL | Status: DC
  Administered 2015-12-07 – 2015-12-08 (×2): 100 mg
  Filled 2015-12-06 (×2): qty 1

## 2015-12-06 MED ORDER — ASPIRIN 81 MG PO CHEW
81.0000 mg | CHEWABLE_TABLET | Freq: Every day | ORAL | Status: DC
  Administered 2015-12-07 – 2015-12-08 (×2): 81 mg
  Filled 2015-12-06 (×2): qty 1

## 2015-12-06 MED ORDER — ONDANSETRON HCL 4 MG/2ML IJ SOLN: 4.0000 mg | Freq: Four times a day (QID) | INTRAMUSCULAR | Status: DC | PRN

## 2015-12-06 MED ORDER — PANTOPRAZOLE SODIUM 40 MG PO PACK
40.0000 mg | PACK | Freq: Every day | ORAL | Status: DC
  Administered 2015-12-07 – 2015-12-08 (×2): 40 mg
  Filled 2015-12-06 (×2): qty 20

## 2015-12-06 MED ORDER — ADULT MULTIVITAMIN LIQUID CH
5.0000 mL | Freq: Every day | ORAL | Status: DC
  Administered 2015-12-07 – 2015-12-08 (×2): 5 mL
  Filled 2015-12-06 (×3): qty 5

## 2015-12-06 MED ORDER — ANTISEPTIC ORAL RINSE SOLUTION (CORINZ)
7.0000 mL | Freq: Four times a day (QID) | OROMUCOSAL | Status: DC
  Administered 2015-12-07 – 2015-12-09 (×10): 7 mL via OROMUCOSAL
  Filled 2015-12-06 (×14): qty 7

## 2015-12-06 MED ORDER — ALBUTEROL SULFATE (2.5 MG/3ML) 0.083% IN NEBU
2.5000 mg | INHALATION_SOLUTION | RESPIRATORY_TRACT | Status: DC | PRN
Start: 2015-12-06 — End: 2015-12-09

## 2015-12-06 MED ORDER — LIDOCAINE 5 % EX PTCH
1.0000 | MEDICATED_PATCH | CUTANEOUS | Status: DC
  Administered 2015-12-07 – 2015-12-08 (×2): 1 via TRANSDERMAL
  Filled 2015-12-06 (×3): qty 1

## 2015-12-06 NOTE — Progress Notes (Signed)
MEDICATION RELATED CONSULT NOTE - Follow up   Pharmacy Consult for Renal dosing/HD  Allergies  Allergen Reactions  . Contrast Media [Iodinated Diagnostic Agents] Anaphylaxis  . Ampicillin Rash   Patient Measurements: Ht 40ft 9in Height:  (SCALE BROKE) Weight:  (Beds scale broken) IBW/kg (Calculated) : 66.2  Vital Signs: Temp: 97.7 F (36.5 C) (01/17 1200) Temp Source: Oral (01/17 1200) BP: 125/46 mmHg (01/17 1200) Pulse Rate: 75 (01/17 1200)  Recent Labs  12/05/15 0538  WBC 15.1*  HGB 7.2*  HCT 23.7*  PLT 62*  CREATININE 1.36*  MG 2.0  PHOS 5.4*  ALBUMIN 1.7*  PROT 4.9*  AST 43*  ALT 34  ALKPHOS 2594*  BILITOT 1.2   Estimated Creatinine Clearance: 62.9 mL/min (by C-G formula based on Cr of 1.36).  Assessment: 50 yo patient on HD MWF. Pharmacy consulted for renal dosing of medications.   Plan:  No medications require adjustment at present. Pharmacy will continue to monitor and dose adjust as needed.    Luisa Hart, PharmD Clinical Pharmacist  Clinical Pharmacist 12/06/2015

## 2015-12-06 NOTE — Progress Notes (Signed)
PULMONARY / CRITICAL CARE MEDICINE   Name: Courtney [name] MRN: [id number] DOB: Jun 27, 1966    ADMISSION DATE:  2015-09-03  BRIEF HISTORY: 49 AAF who has been in medical facilities (hosp, LTAC, rehab) for 2 yrs following gastric bypass surgery with multiple complications. Now with chronic trach, ESRD, profound debilitation, severe sacral pressure ulcer. Was seemingly making progress and transferred to rehab facility approx one week prior to this admission. She was sent to [facility] ED with AMS and hypotension. Working dx of severe sepsis/septic shock due to infected sacral pressure ulcer. Since admission. Her course is been very complicated with numerous complications including septic shock and GI bleeding. Now with failure to wean from vent and possible MI event, repeat ECHO EF 30-35, possible old MI, severe hypokinesis of the anterior and anteroseptal myocardium   SUBJ:   The patient is minimally responsive today. She opens eyes but does not follow commands. Her breathing appears dyspneic. Platelets are low today, heparin was stopped.  Summary of MAJOR EVENTS/TEST RESULTS: Admission 02/07/14-05/07/14 Admission 07/21/14-09/06/14 Discharged to [facility]. Pt had palliative consult at that time, were asked to sign off by husband.  09/23 CT head: NAD 09/23 EEG: no epileptiform activity 09/23 PRBCs for Hgb 6.4 09/24 bedside debridement of sacral wound. Abscess drained 09/25 Off vasopressors. More alert. No distress. Worsening thrombocytopenia. Vanc DC'd 09/29 Dr. Sampson Goon (I.D) excused from the case by patient's husband. 10/02 MRI -multiple infarcts 10/03 tracheal bleeding- transfused platelets 10/03 hospitalist service excused from the case by patient's husband 10/03 Echocardiogram ejection fraction was 55-60%, pulmonary systolic pressure was 39 mmHg 40/98 restart TF's at lower rate, attempt reg HD  10/12 Transferred to med-surg floor. Remains on PCCM service 10/14 SLP eval: pt unable to  tolerate PMV adequately 10/17-will re-attempt PM valve-discussed with Speech therapist 10/18 passed swallow eval-start pureed thick foods no thin liquids-continue NG feeds 10/19 transferred to step down for sepsis/aspiration pneumonia 10/19 cxr shows RLL opacity 10/21 sacral decub debride at bedside by surgery 10/22 started back on vasopressors while on HD 10/27 CT with osteo, R hip fx, unable to identify tip of dubhoff tube - sent for fluoro study 10/28 Ortho consultation: I do not feel that she is a surgical candidate. Therefore, I feel that it would best to manage this fracture nonsurgically and allow it to heal by itself over time, which it should.  10/28 Gen Surg consultation: Due to the lack of free air or free flow of contrast and the peritoneum there is no indication for any surgical intervention on this. Would recommend pulling back the feeding tube 1-2 cm. Would recheck an abdominal film to confirm no pneumoperitoneum in the morning. Absence of any changes okay to continue using Dobbhoff for feeding and medications. 10/28 gastrograffin study: The study confirms that the feeding tube pes perforated through the duodenum and the tip is within a cavity that fills with injected contrast. The cavity does appear walled off 11/4 refuses oral feeds, continue TF's CT reviewed: T8-T9 discitis/osteomyelitis, RT HIP FRACTURE 11/5 placed back on vasopressors, placed back on Vent due to resp acidosis, levophed turned off 11/6 afternoon 11/5 PM Trach changed out due to cuff leak, #6 Shiley cuffed, 11/6 dubhoff occluded, removed and replaced, new dubhoff shows tube in the antral stomach 11/15 refusing to take oral feeds 11/18 placed back on Vent for increased WOB,SOB. 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 10/18/2015; the patient developed a new  left basal pneumonia; with recurrent sepsis.  10/19/2015; fevers resolved  with IV acetaminophen. Tube feeds changed from vital to Jevity.  12/7 CXR with stable b/l opacities.  12/12 elevated K s/p HD-remains on AC mode 12/16-vomiting-hold feeds and re-assess in next 24 hrs 12/17- TF restarted at 1/2 goal (goal = 50cc/per) 12/18- elevated wbc, fever (101.4), recultured, restarted on ceftaz 12/19 Pericardial friction rub. Echocardiogram: LVEF 30-35%, anteroseptal hypokinesis or akinesis c/w anterior wall MI 12/20 Dr Sung Amabile discussed new echocardiogram findings with pt's husband and explained that this likely represents an anterior wall MI sometime in the previous couple of weeks. It was explained that she is not a candidate for any cardiology intervention. Code status was again discussed. Husband expressed understanding that she was not going to survive this illness in any favorable way but continues to request that she be full code status based on what he believes to be her wishes 12/21 vomitied 50cc of TF - AM TF held, restart TF at 3pm @30cc  12/26 Persistent intermittent trach cuff leak. Distal XLT trach requested.  12/27 15 beat NSVT 12/28 patient refused HD and refused Bath time 12/29 D10 infusion for hypoglycemia, TF's on hold due to intolerance 12/30 CODE BLUE  VTACH 01/01 transfuse 2 units of blood 01/02 increased respiratory distress, copious purulent secretions 01/02 LE venous US: No DVT 01/03 Repeat TTE: The cavity size was mildly dilated. Systolic function was moderately to severely reduced. The estimated ejection fraction was in the range of 30% to 35%. Diffuse hypokinesis. Moderate to severe hypokinesis of the anteroseptal, anterior, and apical myocardium. Hypokinesis of the anterior myocardium 01/05 Hypothermic - Bair Hugger ordered 01/10-placed back on vasopressors for septic shock 1/11-failed SBT due to resp muscle fatigue 1/13-worsening shock and hypoxia, agonal respirations  INDWELLING DEVICES:: Trach (chronic) placed June 2014 Tunneled R IJ  HD cath (chronic) Tunneled L IJ CVL (chronic) L femoral A-line 9/23 >> 9/25  MICRO DATA: 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 Pneumoniae, P. Mirabilis, VRE Wound 9/26 >> No growth Blood 9/23 >> NEG CDiff 9/27>>neg Stool Cx 10/15>> negative Cdiff 10/25>>neg Trach Aspirate 10/28>> light growth pseudomonas Blood 10/28 >> 1/2 GPC >>  Sputum cultures obtained 09/22/15 due to mucus plugging>>Pseudomonas BONE TISSUE Cx 11/3>>E.coli (ESBL), k. Pneumonia (daptomycin and septra) Bld Cx 11/27>>negative thus far.  Trach Asp 11/27>> grew out Pseudomonas, and Klebsiella pneumonia. Trach Asp 12/18>> MDR Klebsiella, Pseudomonas Bld Cx 12/18>> NEG 12/26 resp culture >> MDR pseudomonas  ANTIMICROBIALS:  Aztreonam 9/23 >> 9/24 Vanc 9/23 >> 9/25 Vanc 9/26>>9/27 Daptomycin 9/27>> 10/12, 10/28>> off Meropenem 9/23 >> 10/14, 10/28 >> 10/31 Zosyn 11/27,>> 11/30. Levaquin 11/29>> 11/29. Ceftaz 12/1>>12/11, 12/18>> 12/24 Colisitin 12/26 >>  Tigecycline 12/26 >>  Nebulized tobramycin 12/30 >>  VITAL SIGNS: Temp:  [96.8 F (36 C)-98.1 F (36.7 C)] 97.7 F (36.5 C) (01/17 1200) Pulse Rate:  [38-78] 39 (01/17 1300) Resp:  [0-17] 0 (01/17 1300) BP: (89-125)/(32-99) 111/56 mmHg (01/17 1300) SpO2:  [96 %-100 %] 98 % (01/17 1300) FiO2 (%):  [35 %] 35 % (01/17 1200) HEMODYNAMICS:   VENTILATOR SETTINGS: Vent Mode:  [-] PRVC FiO2 (%):  [35 %] 35 % Set Rate:  [14 bmp] 14 bmp Vt Set:  [500 mL] 500 mL PEEP:  [5 cmH20] 5 cmH20 INTAKE / OUTPUT:  Intake/Output Summary (Last 24 hours) at 12/06/15 1312  Last data filed at 12/06/15 1300  Gross per 24 hour  Intake 1961.51 ml  Output      0 ml  Net 1961.51 ml    Review of Systems  Unable to perform ROS: critical illness   Physical Exam   Constitutional:  Critical ill appearance  HENT:  Head: Normocephalic and atraumatic.  Right Ear: External ear normal.  Left Ear: External ear normal.  Eyes: Conjunctivae are normal. Pupils are equal, round, and reactive to light. Right eye exhibits no discharge. Left eye exhibits no discharge.  Neck: Neck supple. No tracheal deviation present. No thyromegaly present.  Cardiovascular: Regular rhythm, normal heart sounds and intact distal pulses.   No murmur heard. Pulmonary/Chest: She has no wheezes.  Tracheostomy - on MV  Abdominal: Soft. Bowel sounds are normal. She exhibits no distension.  Musculoskeletal: She exhibits edema.  Ext cool, trace symmetric edema, marked muscle wasting Severe sacral decubitus ulcer- to the spine  Skin: Skin is warm.  Nursing note and vitals reviewed.   RASS 0, intermittent trach tube cuff leak,vent graphics look good, no air leak  HEENT; Cushingoid facies, temporal wasting, NGT present. Tube feeds are infusing. Trach site clean Diffuse rhonchi Reg with occ extrasystoles, no M Abdomen soft, + BS Ext cool, trace symmetric edema, marked muscle wasting Severe sacral decubitus ulcer- to the spine  LABS:  CBC  Recent Labs Lab 12/02/15 1609 12/03/15 0552 12/05/15 0538  WBC 13.5* 15.5* 15.1*  HGB 8.8* 8.5* 7.2*  HCT 30.4* 28.0* 23.7*  PLT 58* 58* 62*   Coag's No results for input(s): APTT, INR in the last 168 hours. BMET  Recent Labs Lab 11/30/15 1633 12/02/15 1609 12/05/15 0538  NA 129* 132* 131*  K 3.9 3.7 3.9  CL 97* 96* 96*  CO2 BUN 95* 95* 106*  CREATININE 1.38* 1.34* 1.36*  GLUCOSE 140* 186* 124*   Electrolytes  Recent Labs Lab 11/30/15 1633 12/02/15 1609 12/05/15 0538  CALCIUM 8.0* 8.2* 8.0*  MG  --   --  2.0  PHOS  --  5.6* 5.4*   Sepsis Markers No results for input(s): LATICACIDVEN, PROCALCITON, O2SATVEN in the last 168 hours. ABG No results for input(s): PHART, PCO2ART, PO2ART in the last 168  hours. Liver Enzymes  Recent Labs Lab 12/02/15 1609 12/05/15 0538  AST  --  43*  ALT  --  34  ALKPHOS  --  2594*  BILITOT  --  1.2  ALBUMIN 1.6* 1.7*   Cardiac Enzymes No results for input(s): TROPONINI, PROBNP in the last 168 hours. Glucose  Recent Labs Lab 12/04/15 2332 12/05/15 0750 12/05/15 1643 12/05/15 2348 12/06/15 0726 12/06/15 1152  GLUCAP 123* 133* 118* 123* 125* 146*    Imaging No results found.  ASSESSMENT / PLAN: IMPRESSION: Very prolonged and complicated hospitalization after gastric bypass surgery 2014 Never returned home ESRD-hd as tolerated Recurrent severe sepsis - PAN resistant Klebisella in sputum Recurrent PNA-now with pseudomonas H/O C diff H/O VTE DM2 - controlled Episodic hypoglycemia Chronic steroid therapy, for a history of of adrenal insufficiency Incidental finding of R hip fx - conservative mgmt. 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 Husband declined TEE Profound deconditioning Chronic pain New finding of pericardial  friction rub 12/19 - resolved New finding of cardiomyopathy, likely post MI 12/19  PAF/flutter Sinus bradycardia HCAP with MDR Donalee Citrin and pseudomonas Chronic anemia Severe protein - calorie malnutrition TF intolerance Thrombocytopenia 01/13 - may need to consider stopping heparin and ASA if plts continues to be low on lab recheck in the AM.  Cont vent support - settings reviewed and/or adjusted Cont vent bundle Cont HD per renal Monitor BMET intermittently Monitor I/Os Correct electrolytes as indicated DVT px: SQ heparin Monitor CBC intermittently Transfuse per usual guidelines Monitor temp, WBC  count Micro and abx as above ID following - appreciate recs Cont Amiodarone - 100 mg per tube daily DC metoprolol Cont opiates PRN Cont D10 Infusion for hypoglycemia TFs @  30 cc/hr Vasopressors to keep MAP>60   Updated husband via telephone  - he is updated about the patient's poor respiratory and neuro status. 2. Continue the current course of therapy.  Prognosis is very poor without hope for functional recovery. Patient is dialysis and Vent dependent with severe sacral decub ulcer with chronic bone infection and spinal infection with underlying CHF and CVA with recurrent pneumonia.   This patient in my opinion is deemed Futile. Husband understands but unrealistic about goals of care. Today Mr. Bennye Alm is demanding an EEG. I asked why he would like an EEG, he says that he would like to know definitively whether his wife has had a stroke. I informed him that this test will not tell provide that information. I informed him that the results would not change our management. He said he would like to know if the EEG shows slowing. I asked whether he would like to change management based on this, he confirms that it will not change his management,   he said that he would still want to continue current measures regardless of the results.  I informed him that as I do not think that the test will be useful or add anything to her care, I will not order it. He informed me that he will simply go call other doctors until he gets someone who will agree to order the EEG.    Wells Guiles, M.D.   Critical Care Attestation.  I have personally obtained a history, examined the patient, evaluated laboratory and imaging results, formulated the assessment and plan and placed orders. The Patient requires high complexity decision making for assessment and support, frequent evaluation and titration of therapies, application of advanced monitoring technologies and extensive interpretation of multiple databases.  The patient has critical illness that could lead imminently to failure of 1 or more organ systems and requires the highest level of physician preparedness to intervene.  Critical Care Time devoted to patient care services described in this note is 35 minutes and is exclusive of time spent in procedures.

## 2015-12-06 NOTE — Progress Notes (Signed)
Notified Mr. Courtney [name] that I spoke with Dr. Ardyth Man and that neurologist consult for EEG was not indicated at this time. Will continue to assess and monitor.

## 2015-12-06 NOTE — Progress Notes (Signed)
Patients husband called the charge nurse and requested who is the neurologist on call and wanted to have a repeat EEG. Called Mr. Orlean Holtrop and he stated that he wanted an EEG to be done. I let him know that in most cases EEG is used to determine brain death and at this point we know that is not the case. He then requested who the neurologist is on call and I let him know that I would check into this and get back with him. I need to see if we have an active consult still open with neurology. Will continue to assess and monitor.

## 2015-12-06 NOTE — Progress Notes (Signed)
ANTIBIOTIC CONSULT NOTE - follow Up  Pharmacy Consult for Colistin and Tigecycline  Indication: MDR Respiratory Infection   Allergies  Allergen Reactions  . Contrast Media [Iodinated Diagnostic Agents] Anaphylaxis  . Ampicillin Rash   Patient Measurements: Height:  (SCALE BROKE) Weight:  (Beds scale broken) IBW/kg (Calculated) : 66.2  Vital Signs: Temp: 97.7 F (36.5 C) (01/17 1200) Temp Source: Oral (01/17 1200) BP: 125/46 mmHg (01/17 1200) Pulse Rate: 75 (01/17 1200) Intake/Output from previous day: 01/16 0701 - 01/17 0700 In: 1707.8 [I.V.:747.8; NG/GT:760; IV Piggyback:200] Out: 500  Intake/Output from this shift: Total I/O In: 265.2 [I.V.:145.2; NG/GT:120] Out: -   Recent Labs  12/05/15 0538  WBC 15.1*  HGB 7.2*  PLT 62*  CREATININE 1.36*   Estimated Creatinine Clearance: 62.9 mL/min (by C-G formula based on Cr of 1.36). No results for input(s): VANCOTROUGH, VANCOPEAK, VANCORANDOM, GENTTROUGH, GENTPEAK, GENTRANDOM, TOBRATROUGH, TOBRAPEAK, TOBRARND, AMIKACINPEAK, AMIKACINTROU, AMIKACIN in the last 72 hours.   Microbiology: Recent Results (from the past 720 hour(s))  Culture, respiratory (NON-Expectorated)     Status: None   Collection Time: 11/14/15  2:07 PM  Result Value Ref Range Status   Specimen Description TRACHEAL ASPIRATE  Final   Special Requests NONE  Final   Gram Stain   Final    EXCELLENT SPECIMEN - 90-100% WBCS MANY WBC SEEN MODERATE GRAM NEGATIVE RODS    Culture   Final    HEAVY GROWTH PSEUDOMONAS AERUGINOSA CRITICAL VALUE NOTED.  VALUE IS CONSISTENT WITH PREVIOUSLY REPORTED AND CALLED VALUE.    Report Status 11/16/2015 FINAL  Final   Organism ID, Bacteria PSEUDOMONAS AERUGINOSA  Final      Susceptibility   Pseudomonas aeruginosa - MIC*    CEFTAZIDIME >=64 RESISTANT Resistant     CIPROFLOXACIN 2 INTERMEDIATE Intermediate     GENTAMICIN >=16 RESISTANT Resistant     IMIPENEM >=16 RESISTANT Resistant     PIP/TAZO Value in next row  Resistant      RESISTANT>=128    TOBRAMYCIN Value in next row Sensitive      SENSITIVE2    * HEAVY GROWTH PSEUDOMONAS AERUGINOSA    Medical History: Past Medical History  Diagnosis Date  . Obesity   . Dyslipidemia   . Hypertension   . Coronary artery disease     s/p BMS 2010 LAD  . Dysrhythmia     ventricular tachycardia resolved after LAD stent and beta blocker  . Diabetes mellitus     with retinopathy, neuropathy and microalbuminemia  . 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 Patient is on day 117 of hospital stay, and has been treated for multiple infections during this time.   Plan:  Per microbiology, Klebsiella is sensitive to Tygacil. Sensitivity results for Avycaz and colistin are still pending.   Continue tigecycline  IV q12h.   Continue  colistimethate  (~2.5 mg/kg) IV q48 hours.   Patient should be monitored for nephrotoxicity and neurotoxicity.  Pharmacy to continue to follow per consult.      Luisa Hart, PharmD Clinical Pharmacist   12/06/2015 1:02 PM

## 2015-12-06 NOTE — Progress Notes (Signed)
Subjective:  Patient seen at bedside. Remains critically ill at this point in time. Had hemodialysis yesterday and ultrafiltration achieved was 0.5 kg. She remains on Levophed this a.m.  Objective:  Vital signs in last 24 hours:  Temp:  [96.8 F (36 C)-98.1 F (36.7 C)] 96.8 F (36 C) (01/17 0400) Pulse Rate:  [37-79] 77 (01/17 0600) Resp:  [0-20] 0 (01/17 0600) BP: (89-131)/(32-81) 96/67 mmHg (01/17 0600) SpO2:  [95 %-100 %] 98 % (01/17 0600) FiO2 (%):  [35 %] 35 % (01/17 0400)  Weight change:  Filed Weights   11/22/15 0500  Weight: 102 kg (224 lb 13.9 oz)    Intake/Output: I/O last 3 completed shifts: In: 2629.9 [I.V.:1016.6; NG/GT:1160; IV Piggyback:453.3] Out: 500 [Other:500]   Intake/Output this shift:     Physical Exam: General: critically ill   Head/ENT: Moist oral mucosal membrane, +NG tube  Eyes: Anicteric, eyes open  Neck: Tracheostomy in place  Lungs:  vent assisted , diffuse crackles  Heart: S1S2 no rubs  Abdomen:  Soft, NT, BS present  Extremities: Anasarca, 3+ in LE's  Neurologic: Eyes open but not following commands   Access:  R IJ permcath.       Basic Metabolic Panel:  Recent Labs Lab 11/30/15 1633 12/02/15 1609 12/05/15 0538  NA 129* 132* 131*  K 3.9 3.7 3.9  CL 97* 96* 96*  CO2 27 29 29   GLUCOSE 140* 186* 124*  BUN 95* 95* 106*  CREATININE 1.38* 1.34* 1.36*  CALCIUM 8.0* 8.2* 8.0*  MG  --   --  2.0  PHOS  --  5.6* 5.4*    Liver Function Tests:  Recent Labs Lab 12/02/15 1609 12/05/15 0538  AST  --  43*  ALT  --  34  ALKPHOS  --  2594*  BILITOT  --  1.2  PROT  --  4.9*  ALBUMIN 1.6* 1.7*   No results for input(s): LIPASE, AMYLASE in the last 168 hours. No results for input(s): AMMONIA in the last 168 hours.  CBC:  Recent Labs Lab 11/30/15 1633 12/02/15 1609 12/03/15 0552 12/05/15 0538  WBC 13.3* 13.5* 15.5* 15.1*  NEUTROABS 11.4*  --   --  12.7*  HGB 8.9* 8.8* 8.5* 7.2*  HCT 29.7* 30.4* 28.0* 23.7*  MCV  97.5 98.8 98.0 98.7  PLT 68* 58* 58* 62*    Cardiac Enzymes: No results for input(s): CKTOTAL, CKMB, CKMBINDEX, TROPONINI in the last 168 hours.  BNP: Invalid input(s): POCBNP  CBG:  Recent Labs Lab 12/04/15 1551 12/04/15 2332 12/05/15 0750 12/05/15 1643 12/05/15 2348  GLUCAP 151* 123* 133* 118* 123*    Microbiology: Results for orders placed or performed during the hospital encounter of 10-Sep-2015  Blood Culture (routine x 2)     Status: None   Collection Time: 09-10-15  8:51 AM  Result Value Ref Range Status   Specimen Description BLOOD [name]  Final   Special Requests BOTTLES DRAWN AEROBIC AND ANAEROBIC  3CC  Final   Culture NO GROWTH 5 DAYS  Final   Report Status 08/17/2015 FINAL  Final  Blood Culture (routine x 2)     Status: None   Collection Time: 10-Sep-2015  9:20 AM  Result Value Ref Range Status   Specimen Description BLOOD LEFT ARM  Final   Special Requests BOTTLES DRAWN AEROBIC AND ANAEROBIC  1CC  Final   Culture NO GROWTH 5 DAYS  Final   Report Status 08/17/2015 FINAL  Final  Wound culture  Status: None   Collection Time: 09/04/2015  9:20 AM  Result Value Ref Range Status   Specimen Description DECUBITIS  Final   Special Requests Normal  Final   Gram Stain   Final    FEW WBC SEEN MANY GRAM NEGATIVE RODS RARE GRAM POSITIVE COCCI    Culture   Final    HEAVY GROWTH ESCHERICHIA COLI MODERATE GROWTH ENTEROBACTER AEROGENES PROTEUS MIRABILIS HEAVY GROWTH ENTEROCOCCUS SPECIES VRE HAVE INTRINSIC RESISTANCE TO MOST COMMONLY USED ANTIBIOTICS AND THE ABILITY TO ACQUIRE RESISTANCE TO MOST AVAILABLE ANTIBIOTICS.    Report Status 08/16/2015 FINAL  Final   Organism ID, Bacteria ESCHERICHIA COLI  Final   Organism ID, Bacteria ENTEROBACTER AEROGENES  Final   Organism ID, Bacteria PROTEUS MIRABILIS  Final   Organism ID, Bacteria ENTEROCOCCUS SPECIES  Final      Susceptibility   Enterobacter aerogenes - MIC*    CEFTAZIDIME <=1 SENSITIVE Sensitive     CEFAZOLIN >=64  RESISTANT Resistant     CEFTRIAXONE <=1 SENSITIVE Sensitive     CIPROFLOXACIN <=0.25 SENSITIVE Sensitive     GENTAMICIN <=1 SENSITIVE Sensitive     IMIPENEM 1 SENSITIVE Sensitive     TRIMETH/SULFA <=20 SENSITIVE Sensitive     * MODERATE GROWTH ENTEROBACTER AEROGENES   Escherichia coli - MIC*    AMPICILLIN <=2 SENSITIVE Sensitive     CEFTAZIDIME <=1 SENSITIVE Sensitive     CEFAZOLIN <=4 SENSITIVE Sensitive     CEFTRIAXONE <=1 SENSITIVE Sensitive     CIPROFLOXACIN <=0.25 SENSITIVE Sensitive     GENTAMICIN <=1 SENSITIVE Sensitive     IMIPENEM <=0.25 SENSITIVE Sensitive     TRIMETH/SULFA <=20 SENSITIVE Sensitive     * HEAVY GROWTH ESCHERICHIA COLI   Proteus mirabilis - MIC*    AMPICILLIN >=32 RESISTANT Resistant     CEFTAZIDIME <=1 SENSITIVE Sensitive     CEFAZOLIN 8 SENSITIVE Sensitive     CEFTRIAXONE <=1 SENSITIVE Sensitive     CIPROFLOXACIN <=0.25 SENSITIVE Sensitive     GENTAMICIN <=1 SENSITIVE Sensitive     IMIPENEM 1 SENSITIVE Sensitive     TRIMETH/SULFA <=20 SENSITIVE Sensitive     * PROTEUS MIRABILIS   Enterococcus species - MIC*    AMPICILLIN >=32 RESISTANT Resistant     VANCOMYCIN >=32 RESISTANT Resistant     GENTAMICIN SYNERGY SENSITIVE Sensitive     TETRACYCLINE Value in next row Resistant      RESISTANT>=16    * HEAVY GROWTH ENTEROCOCCUS SPECIES  MRSA PCR Screening     Status: None   Collection Time: 09-04-15  2:38 PM  Result Value Ref Range Status   MRSA by PCR NEGATIVE NEGATIVE Final    Comment:        The GeneXpert MRSA Assay (FDA approved for NASAL specimens only), is one component of a comprehensive MRSA colonization surveillance program. It is not intended to diagnose MRSA infection nor to guide or monitor treatment for MRSA infections.   Blood culture (single)     Status: None   Collection Time: 2015-09-04  3:36 PM  Result Value Ref Range Status   Specimen Description BLOOD RIGHT ASSIST CONTROL  Final   Special Requests BOTTLES DRAWN AEROBIC AND  ANAEROBIC  Final   Culture NO GROWTH 5 DAYS  Final   Report Status 08/17/2015 FINAL  Final  Wound culture     Status: None   Collection Time: 08/13/15 12:37 PM  Result Value Ref Range Status   Specimen Description WOUND  Final   Special  Requests Normal  Final   Gram Stain   Final    FEW WBC SEEN TOO NUMEROUS TO COUNT GRAM NEGATIVE RODS FEW GRAM POSITIVE COCCI    Culture   Final    HEAVY GROWTH ESCHERICHIA COLI MODERATE GROWTH PROTEUS MIRABILIS LIGHT GROWTH KLEBSIELLA PNEUMONIAE MODERATE GROWTH ENTEROCOCCUS GALLINARUM CRITICAL RESULT CALLED TO, READ BACK BY AND VERIFIED WITH: [facility] AT 1042 08/16/15 DV    Report Status 08/17/2015 FINAL  Final   Organism ID, Bacteria ESCHERICHIA COLI  Final   Organism ID, Bacteria PROTEUS MIRABILIS  Final   Organism ID, Bacteria KLEBSIELLA PNEUMONIAE  Final   Organism ID, Bacteria ENTEROCOCCUS GALLINARUM  Final      Susceptibility   Escherichia coli - MIC*    AMPICILLIN >=32 RESISTANT Resistant     CEFTAZIDIME 4 RESISTANT Resistant     CEFAZOLIN >=64 RESISTANT Resistant     CEFTRIAXONE 16 RESISTANT Resistant     GENTAMICIN 2 SENSITIVE Sensitive     IMIPENEM >=16 RESISTANT Resistant     TRIMETH/SULFA <=20 SENSITIVE Sensitive     Extended ESBL POSITIVE Resistant     PIP/TAZO Value in next row Resistant      RESISTANT>=128    CIPROFLOXACIN Value in next row Sensitive      SENSITIVE<=0.25    * HEAVY GROWTH ESCHERICHIA COLI   Klebsiella pneumoniae - MIC*    AMPICILLIN Value in next row Resistant      SENSITIVE<=0.25    CEFTAZIDIME Value in next row Resistant      SENSITIVE<=0.25    CEFAZOLIN Value in next row Resistant      SENSITIVE<=0.25    CEFTRIAXONE Value in next row Resistant      SENSITIVE<=0.25    CIPROFLOXACIN Value in next row Resistant      SENSITIVE<=0.25    GENTAMICIN Value in next row Sensitive      SENSITIVE<=0.25    IMIPENEM Value in next row Resistant      SENSITIVE<=0.25    TRIMETH/SULFA Value in next row  Resistant      SENSITIVE<=0.25    PIP/TAZO Value in next row Resistant      RESISTANT>=128    * LIGHT GROWTH KLEBSIELLA PNEUMONIAE   Proteus mirabilis - MIC*    AMPICILLIN Value in next row Resistant      RESISTANT>=128    CEFTAZIDIME Value in next row Sensitive      RESISTANT>=128    CEFAZOLIN Value in next row Sensitive      RESISTANT>=128    CEFTRIAXONE Value in next row Sensitive      RESISTANT>=128    CIPROFLOXACIN Value in next row Sensitive      RESISTANT>=128    GENTAMICIN Value in next row Sensitive      RESISTANT>=128    IMIPENEM Value in next row Sensitive      RESISTANT>=128    TRIMETH/SULFA Value in next row Sensitive      RESISTANT>=128    PIP/TAZO Value in next row Sensitive      SENSITIVE<=4    * MODERATE GROWTH PROTEUS MIRABILIS   Enterococcus gallinarum - MIC*    AMPICILLIN Value in next row Resistant      SENSITIVE<=4    GENTAMICIN SYNERGY Value in next row Sensitive      SENSITIVE<=4    CIPROFLOXACIN Value in next row Resistant      RESISTANT>=8    TETRACYCLINE Value in next row Resistant      RESISTANT>=16    * MODERATE  GROWTH ENTEROCOCCUS GALLINARUM  Wound culture     Status: None   Collection Time: 08/15/15  2:53 PM  Result Value Ref Range Status   Specimen Description WOUND  Final   Special Requests NONE  Final   Gram Stain FEW WBC SEEN NO ORGANISMS SEEN   Final   Culture NO GROWTH 3 DAYS  Final   Report Status 08/18/2015 FINAL  Final  C difficile quick scan w PCR reflex     Status: None   Collection Time: 08/17/15 11:34 AM  Result Value Ref Range Status   C Diff antigen NEGATIVE NEGATIVE Final   C Diff toxin NEGATIVE NEGATIVE Final   C Diff interpretation Negative for C. difficile  Final  Stool culture     Status: None   Collection Time: 09/03/15  3:51 PM  Result Value Ref Range Status   Specimen Description STOOL  Final   Special Requests Immunocompromised  Final   Culture   Final    NO SALMONELLA OR SHIGELLA ISOLATED No Pathogenic  E. coli detected NO CAMPYLOBACTER DETECTED    Report Status 09/07/2015 FINAL  Final  C difficile quick scan w PCR reflex     Status: None   Collection Time: 09/13/15 12:51 AM  Result Value Ref Range Status   C Diff antigen NEGATIVE NEGATIVE Final   C Diff toxin NEGATIVE NEGATIVE Final   C Diff interpretation Negative for C. difficile  Final  Culture, blood (routine x 2)     Status: None   Collection Time: 09/16/15 11:24 AM  Result Value Ref Range Status   Specimen Description BLOOD LEFT HAND  Final   Special Requests BOTTLES DRAWN AEROBIC AND ANAEROBIC  1CC  Final   Culture NO GROWTH 5 DAYS  Final   Report Status 09/21/2015 FINAL  Final  Culture, blood (routine x 2)     Status: None   Collection Time: 09/16/15 12:23 PM  Result Value Ref Range Status   Specimen Description BLOOD RIGHT HAND  Final   Special Requests BOTTLES DRAWN AEROBIC AND ANAEROBIC  1CC  Final   Culture  Setup Time   Final    GRAM POSITIVE COCCI AEROBIC BOTTLE ONLY CRITICAL RESULT CALLED TO, READ BACK BY AND VERIFIED WITH: TESS THOMAS,RN 09/17/2015 0631 BY JRS.    Culture   Final    ENTEROCOCCUS FAECALIS AEROBIC BOTTLE ONLY VRE HAVE INTRINSIC RESISTANCE TO MOST COMMONLY USED ANTIBIOTICS AND THE ABILITY TO ACQUIRE RESISTANCE TO MOST AVAILABLE ANTIBIOTICS. CRITICAL RESULT CALLED TO, READ BACK BY AND VERIFIED WITH: CHERYL SMITH AT 4098 09/19/15 DV    Report Status 09/21/2015 FINAL  Final   Organism ID, Bacteria ENTEROCOCCUS FAECALIS  Final      Susceptibility   Enterococcus faecalis - MIC*    AMPICILLIN <=2 SENSITIVE Sensitive     LINEZOLID 2 SENSITIVE Sensitive     CIPROFLOXACIN Value in next row Resistant      RESISTANT>=8    TETRACYCLINE Value in next row Resistant      RESISTANT>=16    VANCOMYCIN Value in next row Resistant      RESISTANT>=32    GENTAMICIN SYNERGY Value in next row Resistant      RESISTANT>=32    * ENTEROCOCCUS FAECALIS  Culture, respiratory (NON-Expectorated)     Status: None    Collection Time: 09/16/15  3:50 PM  Result Value Ref Range Status   Specimen Description TRACHEAL ASPIRATE  Final   Special Requests Immunocompromised  Final   Gram Stain  Final    FEW WBC SEEN GOOD SPECIMEN - 80-90% WBCS RARE GRAM NEGATIVE RODS    Culture LIGHT GROWTH PSEUDOMONAS AERUGINOSA  Final   Report Status 09/19/2015 FINAL  Final   Organism ID, Bacteria PSEUDOMONAS AERUGINOSA  Final      Susceptibility   Pseudomonas aeruginosa - MIC*    CEFTAZIDIME 8 SENSITIVE Sensitive     CIPROFLOXACIN 2 INTERMEDIATE Intermediate     GENTAMICIN >=16 RESISTANT Resistant     IMIPENEM >=16 RESISTANT Resistant     PIP/TAZO Value in next row Sensitive      SENSITIVE32    CEFEPIME Value in next row Sensitive      SENSITIVE8    LEVOFLOXACIN Value in next row Resistant      RESISTANT>=8    * LIGHT GROWTH PSEUDOMONAS AERUGINOSA  Culture, expectorated sputum-assessment     Status: None   Collection Time: 09/22/15  2:09 PM  Result Value Ref Range Status   Specimen Description ENDOTRACHEAL  Final   Special Requests Normal  Final   Sputum evaluation THIS SPECIMEN IS ACCEPTABLE FOR SPUTUM CULTURE  Final   Report Status 09/24/2015 FINAL  Final  Culture, respiratory (NON-Expectorated)     Status: None   Collection Time: 09/22/15  2:09 PM  Result Value Ref Range Status   Specimen Description ENDOTRACHEAL  Final   Special Requests Normal Reflexed from [id number]  Final   Gram Stain   Final    FEW WBC SEEN FEW GRAM NEGATIVE RODS POOR SPECIMEN - LESS THAN 70% WBCS    Culture   Final    MODERATE GROWTH PSEUDOMONAS AERUGINOSA LIGHT GROWTH KLEBSIELLA PNEUMONIAE REFER TO SENSITIVITIES FROM PREVIOUS CULTURE FOR ORG 2    Report Status 10/01/2015 FINAL  Final   Organism ID, Bacteria PSEUDOMONAS AERUGINOSA  Final   Organism ID, Bacteria KLEBSIELLA PNEUMONIAE  Final      Susceptibility   Pseudomonas aeruginosa - MIC*    CEFTAZIDIME 4 SENSITIVE Sensitive     CIPROFLOXACIN 2 INTERMEDIATE Intermediate      GENTAMICIN >=16 RESISTANT Resistant     IMIPENEM >=16 RESISTANT Resistant     PIP/TAZO Value in next row Sensitive      SENSITIVE16    * MODERATE GROWTH PSEUDOMONAS AERUGINOSA  Tissue culture     Status: None   Collection Time: 09/24/15  6:44 AM  Result Value Ref Range Status   Specimen Description BONE  Final   Special Requests Normal  Final   Gram Stain MODERATE WBC SEEN FEW GRAM NEGATIVE RODS   Final   Culture   Final    MODERATE GROWTH ESCHERICHIA COLI MODERATE GROWTH KLEBSIELLA PNEUMONIAE ESBL-EXTENDED SPECTRUM BETA LACTAMASE-THE ORGANISM IS RESISTANT TO PENICILLINS, CEPHALOSPORINS AND AZTREONAM ACCORDING TO CLSI M100-S15 VOL.25 N01 JAN 2005. ORGANISM 1 This organism isolate is resistant to one or more antiotic agents in three or more antimicrobial categories.  Suggest Infectious Disease consult.   ORGANISM 2 CRITICAL RESULT CALLED TO, READ BACK BY AND VERIFIED WITH: RN Mardene CelesteJOANNA LINDSAY 09/27/15 1005AM    Report Status 09/28/2015 FINAL  Final   Organism ID, Bacteria ESCHERICHIA COLI  Final   Organism ID, Bacteria KLEBSIELLA PNEUMONIAE  Final      Susceptibility   Escherichia coli - MIC*    AMPICILLIN >=32 RESISTANT Resistant     CEFTAZIDIME 4 RESISTANT Resistant     CEFAZOLIN >=64 RESISTANT Resistant     CEFTRIAXONE 8 RESISTANT Resistant     GENTAMICIN <=1 SENSITIVE Sensitive     IMIPENEM  8 RESISTANT Resistant     TRIMETH/SULFA <=20 SENSITIVE Sensitive     Extended ESBL POSITIVE Resistant     PIP/TAZO Value in next row Resistant      RESISTANT>=128    * MODERATE GROWTH ESCHERICHIA COLI   Klebsiella pneumoniae - MIC*    AMPICILLIN Value in next row Resistant      RESISTANT>=128    CEFTAZIDIME Value in next row Resistant      RESISTANT>=128    CEFAZOLIN Value in next row Resistant      RESISTANT>=128    CEFTRIAXONE Value in next row Resistant      RESISTANT>=128    CIPROFLOXACIN Value in next row Resistant      RESISTANT>=128    GENTAMICIN Value in next row Resistant       RESISTANT>=128    IMIPENEM Value in next row Resistant      RESISTANT>=128    TRIMETH/SULFA Value in next row Sensitive      RESISTANT>=128    PIP/TAZO Value in next row Resistant      RESISTANT>=128    * MODERATE GROWTH KLEBSIELLA PNEUMONIAE  Anaerobic culture     Status: None   Collection Time: 09/24/15  3:55 PM  Result Value Ref Range Status   Specimen Description BONE  Final   Special Requests Normal  Final   Culture NO ANAEROBES ISOLATED  Final   Report Status 09/29/2015 FINAL  Final  Culture, fungus without smear     Status: None   Collection Time: 09/24/15  3:55 PM  Result Value Ref Range Status   Specimen Description BONE  Final   Special Requests Normal  Final   Culture NO FUNGUS ISOLATED AFTER 24 DAYS  Final   Report Status 10/18/2015 FINAL  Final  C difficile quick scan w PCR reflex     Status: None   Collection Time: 10/07/15  2:02 PM  Result Value Ref Range Status   C Diff antigen NEGATIVE NEGATIVE Final   C Diff toxin NEGATIVE NEGATIVE Final   C Diff interpretation Negative for C. difficile  Final  Culture, blood (routine x 2)     Status: None   Collection Time: 10/16/15  9:52 AM  Result Value Ref Range Status   Specimen Description BLOOD LEFT HAND  Final   Special Requests   Final    BOTTLES DRAWN AEROBIC AND ANAEROBIC  AER 6CC ANA 4CC   Culture NO GROWTH 6 DAYS  Final   Report Status 10/22/2015 FINAL  Final  Culture, blood (routine x 2)     Status: None   Collection Time: 10/16/15 12:25 PM  Result Value Ref Range Status   Specimen Description BLOOD LEFT HAND  Final   Special Requests BOTTLES DRAWN AEROBIC AND ANAEROBIC  5CC  Final   Culture NO GROWTH 6 DAYS  Final   Report Status 10/22/2015 FINAL  Final  Culture, expectorated sputum-assessment     Status: None   Collection Time: 10/18/15  1:15 PM  Result Value Ref Range Status   Specimen Description SPUTUM  Final   Special Requests NONE  Final   Sputum evaluation THIS SPECIMEN IS ACCEPTABLE FOR  SPUTUM CULTURE  Final   Report Status 10/18/2015 FINAL  Final  Culture, respiratory (NON-Expectorated)     Status: None   Collection Time: 10/18/15  1:15 PM  Result Value Ref Range Status   Specimen Description SPUTUM  Final   Special Requests NONE Reflexed from [id number]  Final   Gram Stain  Final    GOOD SPECIMEN - 80-90% WBCS MODERATE WBC SEEN MANY GRAM NEGATIVE RODS    Culture   Final    HEAVY GROWTH PSEUDOMONAS AERUGINOSA MODERATE GROWTH KLEBSIELLA PNEUMONIAE CRITICAL RESULT CALLED TO, READ BACK BY AND VERIFIED WITHRod Mae AT 1610 10/21/15 DV KLEBSIELLA PNEUMONIAE This organism isolate is resistant to one or more antiotic agents in three or more antimicrobial categories.  Suggest Infectious Disease  consult.      Report Status 10/22/2015 FINAL  Final   Organism ID, Bacteria PSEUDOMONAS AERUGINOSA  Final   Organism ID, Bacteria KLEBSIELLA PNEUMONIAE  Final      Susceptibility   Klebsiella pneumoniae - MIC*    AMPICILLIN >=32 RESISTANT Resistant     CEFAZOLIN >=64 RESISTANT Resistant     CEFTRIAXONE >=64 RESISTANT Resistant     CIPROFLOXACIN >=4 RESISTANT Resistant     GENTAMICIN >=16 RESISTANT Resistant     IMIPENEM >=16 RESISTANT Resistant     NITROFURANTOIN >=512 RESISTANT Resistant     TRIMETH/SULFA <=20 SENSITIVE Sensitive     * MODERATE GROWTH KLEBSIELLA PNEUMONIAE   Pseudomonas aeruginosa - MIC*    CEFTAZIDIME 4 SENSITIVE Sensitive     CIPROFLOXACIN >=4 RESISTANT Resistant     GENTAMICIN 8 INTERMEDIATE Intermediate     IMIPENEM >=16 RESISTANT Resistant     PIP/TAZO Value in next row Sensitive      SENSITIVE32    * HEAVY GROWTH PSEUDOMONAS AERUGINOSA  C difficile quick scan w PCR reflex     Status: None   Collection Time: 10/18/15  4:39 PM  Result Value Ref Range Status   C Diff antigen NEGATIVE NEGATIVE Final   C Diff toxin NEGATIVE NEGATIVE Final   C Diff interpretation Negative for C. difficile  Final  Culture, blood (Routine X 2) w Reflex to ID Panel      Status: None   Collection Time: 11/06/15  8:27 AM  Result Value Ref Range Status   Specimen Description BLOOD RIGHT HAND  Final   Special Requests BOTTLES DRAWN AEROBIC AND ANAEROBIC 3CC  Final   Culture NO GROWTH 5 DAYS  Final   Report Status 11/11/2015 FINAL  Final  Culture, blood (Routine X 2) w Reflex to ID Panel     Status: None   Collection Time: 11/06/15  8:29 AM  Result Value Ref Range Status   Specimen Description BLOOD LEFT HAND  Final   Special Requests BOTTLES DRAWN AEROBIC AND ANAEROBIC  1CC  Final   Culture NO GROWTH 5 DAYS  Final   Report Status 11/11/2015 FINAL  Final  Culture, expectorated sputum-assessment     Status: None   Collection Time: 11/06/15  8:41 AM  Result Value Ref Range Status   Specimen Description EXPECTORATED SPUTUM  Final   Special Requests Immunocompromised  Final   Sputum evaluation THIS SPECIMEN IS ACCEPTABLE FOR SPUTUM CULTURE  Final   Report Status 11/06/2015 FINAL  Final  Culture, respiratory (NON-Expectorated)     Status: None   Collection Time: 11/06/15  8:41 AM  Result Value Ref Range Status   Specimen Description EXPECTORATED SPUTUM  Final   Special Requests Immunocompromised Reflexed from [id number]  Final   Gram Stain   Final    MANY WBC SEEN MANY GRAM NEGATIVE RODS EXCELLENT SPECIMEN - 90-100% WBCS    Culture   Final    MODERATE GROWTH KLEBSIELLA PNEUMONIAE MODERATE GROWTH PSEUDOMONAS AERUGINOSA CRITICAL RESULT CALLED TO, READ BACK BY AND VERIFIED WITH: DR. FITZGERALD AT 1120  11/09/15 DV DR. FITZGERALD REQUESTED ORGANISM BE SENT OUT FOR TIGECYCLINE CEFT/AVIBACTAM AND COLISTIN Sent to [facility] for further susceptibility testing. 11/11/15 CTJ    Report Status 11/15/2015 FINAL  Final   Organism ID, Bacteria KLEBSIELLA PNEUMONIAE  Final   Organism ID, Bacteria PSEUDOMONAS AERUGINOSA  Final      Susceptibility   Klebsiella pneumoniae - MIC*    AMPICILLIN >=32 RESISTANT Resistant     CEFAZOLIN >=64 RESISTANT Resistant     CEFTRIAXONE  32 RESISTANT Resistant     CIPROFLOXACIN >=4 RESISTANT Resistant     GENTAMICIN >=16 RESISTANT Resistant     IMIPENEM >=16 RESISTANT Resistant     NITROFURANTOIN 256 RESISTANT Resistant     TRIMETH/SULFA >=320 RESISTANT Resistant     Extended ESBL POSITIVE Resistant     * MODERATE GROWTH KLEBSIELLA PNEUMONIAE   Pseudomonas aeruginosa - MIC*    CEFTAZIDIME >=64 RESISTANT Resistant     CIPROFLOXACIN 2 INTERMEDIATE Intermediate     GENTAMICIN >=16 RESISTANT Resistant     IMIPENEM >=16 RESISTANT Resistant     * MODERATE GROWTH PSEUDOMONAS AERUGINOSA  Culture, respiratory (NON-Expectorated)     Status: None   Collection Time: 11/14/15  2:07 PM  Result Value Ref Range Status   Specimen Description TRACHEAL ASPIRATE  Final   Special Requests NONE  Final   Gram Stain   Final    EXCELLENT SPECIMEN - 90-100% WBCS MANY WBC SEEN MODERATE GRAM NEGATIVE RODS    Culture   Final    HEAVY GROWTH PSEUDOMONAS AERUGINOSA CRITICAL VALUE NOTED.  VALUE IS CONSISTENT WITH PREVIOUSLY REPORTED AND CALLED VALUE.    Report Status 11/16/2015 FINAL  Final   Organism ID, Bacteria PSEUDOMONAS AERUGINOSA  Final      Susceptibility   Pseudomonas aeruginosa - MIC*    CEFTAZIDIME >=64 RESISTANT Resistant     CIPROFLOXACIN 2 INTERMEDIATE Intermediate     GENTAMICIN >=16 RESISTANT Resistant     IMIPENEM >=16 RESISTANT Resistant     PIP/TAZO Value in next row Resistant      RESISTANT>=128    TOBRAMYCIN Value in next row Sensitive      SENSITIVE2    * HEAVY GROWTH PSEUDOMONAS AERUGINOSA    Coagulation Studies: No results for input(s): LABPROT, INR in the last 72 hours.  Urinalysis: No results for input(s): COLORURINE, LABSPEC, PHURINE, GLUCOSEU, HGBUR, BILIRUBINUR, KETONESUR, PROTEINUR, UROBILINOGEN, NITRITE, LEUKOCYTESUR in the last 72 hours.  Invalid input(s): APPERANCEUR    Imaging: Dg Chest Port 1 View  12/05/2015  CLINICAL DATA:  Respiratory failure. EXAM: PORTABLE CHEST 1 VIEW COMPARISON:   11/21/2015. FINDINGS: Tracheostomy tube, left IJ line, right IJ dual-lumen catheter, and feeding tube in stable position. Persistent cardiomegaly . Diffuse bilateral pulmonary infiltrates again noted. Slight interim improvement. No pleural effusion or pneumothorax. IMPRESSION: 1. Lines and tubes in stable position. 2. Cardiomegaly with diffuse bilateral pulmonary infiltrates again noted. Slight improvement. Findings suggesting slight improvement of pulmonary edema and/or pneumonia. Electronically Signed   By: Maisie Fus  Register   On: 12/05/2015 07:39     Medications:   . dextrose 25 mL/hr at 12/04/15 0555  . feeding supplement (VITAL AF 1.2 CAL) 1,000 mL (12/04/15 0908)  . norepinephrine (LEVOPHED) Adult infusion 12 mcg/min (12/06/15 0600)   . albuterol  2.5 mg Nebulization Q6H  . amiodarone  100 mg Per Tube Daily  . antiseptic oral rinse  7 mL Mouth Rinse BID  . antiseptic oral rinse  7 mL Mouth Rinse QID  . aspirin  81  mg Per Tube Daily  . chlorhexidine gluconate  15 mL Mouth Rinse BID  . colistimethate (COLISTIN) IV  250 mg Intravenous Q48H  . epoetin (EPOGEN/PROCRIT) injection  10,000 Units Intravenous Q M,W,F-HD  . escitalopram  10 mg Per Tube Daily  . feeding supplement (PRO-STAT SUGAR FREE 64)  30 mL Per Tube 6 X Daily  . free water  20 mL Per Tube 6 times per day  . hydrocortisone  10 mg Per Tube BID  . lidocaine  1 patch Transdermal Q24H  . midodrine  10 mg Per Tube TID WC  . mirtazapine  7.5 mg Per Tube QHS  . multivitamin  5 mL Per Tube Daily  . pantoprazole sodium  40 mg Per Tube Daily  . sodium chloride  10-40 mL Intracatheter Q12H  . sodium hypochlorite   Irrigation BID  . tigecycline (TYGACIL) IVPB  50 mg Intravenous Q12H  . tobramycin (PF)  300 mg Nebulization BID   acetaminophen (TYLENOL) oral liquid 160 mg/5 mL, albuterol, HYDROmorphone (DILAUDID) injection, loperamide, ondansetron (ZOFRAN) IV, oxyCODONE, polyvinyl alcohol, sodium chloride, zinc oxide  Assessment/  Plan:  50 y.o. 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 Admitted on 2015/09/08  Echo: 11/07/19: EF of 30% to 35%.   1. End-stage renal disease on hemodialysis on HD MWF. The patient has been on dialysis since October of 2014. R IJ permcath.  - Patient completed hemodialysis yesterday. Ultrafiltration achieved was only 0.5 kg. No urgent indication for dialysis today. We will plan for dialysis again tomorrow.  2. Anemia of CKD. Most recent hemoglobin was 7.2. Patient has required multiple blood transfusions this admission. Continue to monitor CBC and transfuse for hemoglobin of 7 or less.  3. Acute resp failure: ventilator dependent -Has been very difficult to wean from the ventilator. Continue ventilator support.   4. Anasarca - Still has significant generalized edema. You have achieved yesterday was only 0.5 kg despite use of Levophed.  5. Other:  - Sepsis:  Multi drug resistant organism - large st 4 decubitus - poor nutrition - not a candidate for outpatient dialysis.         LOS: 116 Dovid Bartko 1/17/20177:22 AM

## 2015-12-07 LAB — GLUCOSE, CAPILLARY
GLUCOSE-CAPILLARY: 134 mg/dL — AB (ref 65–99)
GLUCOSE-CAPILLARY: 139 mg/dL — AB (ref 65–99)
Glucose-Capillary: 130 mg/dL — ABNORMAL HIGH (ref 65–99)
Glucose-Capillary: 137 mg/dL — ABNORMAL HIGH (ref 65–99)
Glucose-Capillary: 142 mg/dL — ABNORMAL HIGH (ref 65–99)

## 2015-12-07 LAB — CBC
HEMATOCRIT: 27.5 % — AB (ref 35.0–47.0)
Hemoglobin: 8.2 g/dL — ABNORMAL LOW (ref 12.0–16.0)
MCH: 30 pg (ref 26.0–34.0)
MCHC: 29.7 g/dL — AB (ref 32.0–36.0)
MCV: 101 fL — ABNORMAL HIGH (ref 80.0–100.0)
Platelets: 59 10*3/uL — ABNORMAL LOW (ref 150–440)
RBC: 2.72 MIL/uL — ABNORMAL LOW (ref 3.80–5.20)
RDW: 22 % — AB (ref 11.5–14.5)
WBC: 18.6 10*3/uL — ABNORMAL HIGH (ref 3.6–11.0)

## 2015-12-07 MED ORDER — ALBUMIN HUMAN 25 % IV SOLN
12.5000 g | Freq: Once | INTRAVENOUS | Status: AC
  Administered 2015-12-07: 12.5 g via INTRAVENOUS
  Filled 2015-12-07: qty 50

## 2015-12-07 NOTE — Consult Note (Signed)
Courtney, [name] [id number] February 18, 1966 Sandi Mealy, MD  Reason for Consult: Courtney [name] leak Requesting Physician: Shane Crutch, MD Consulting Physician: Sandi Mealy, MD  HPI: This 50 y.o. year old female was admitted on 09/09/2015 for Hypokalemia [E87.6] Decubitus ulcer [L89.90] Unresponsive [R40.4] Hypotension, unspecified hypotension type [I95.9]. Patient has respiratory failure, vent dependent, and is requiring high pressures, which has created some challenges with maintaining a seal with her trach. She has a #6 Shiley trach currently. I am consulted to assess and consider an increased trach size.  Medications:  Current Facility-Administered Medications  Medication Dose Route Frequency Provider Last Rate Last Dose  . acetaminophen (TYLENOL) solution 650 mg  650 mg Per Tube Q6H PRN Shane Crutch, MD   650 mg at 11/29/15 2354  . albuterol (PROVENTIL) (2.5 MG/3ML) 0.083% nebulizer solution 2.5 mg  2.5 mg Nebulization Q6H Shane Crutch, MD   2.5 mg at 12/07/15 1411  . albuterol (PROVENTIL) (2.5 MG/3ML) 0.083% nebulizer solution 2.5 mg  2.5 mg Nebulization Q3H PRN Shane Crutch, MD      . amiodarone (PACERONE) tablet 100 mg  100 mg Per Tube Daily Shane Crutch, MD   100 mg at 12/07/15 1137  . antiseptic oral rinse (CPC / CETYLPYRIDINIUM CHLORIDE 0.05%) solution 7 mL  7 mL Mouth Rinse BID Shane Crutch, MD   0 mL at 12/07/15 1141  . antiseptic oral rinse solution (CORINZ)  7 mL Mouth Rinse QID Shane Crutch, MD   7 mL at 12/07/15 1618  . aspirin chewable tablet 81 mg  81 mg Per Tube Daily Shane Crutch, MD   81 mg at 12/07/15 1138  . chlorhexidine gluconate (PERIDEX) 0.12 % solution 15 mL  15 mL Mouth Rinse BID Shane Crutch, MD   15 mL at 12/07/15 0751  . [START ON 12/08/2015] colistimethate (COLYMYCIN) 250 mg in sodium chloride 0.9 % 100 mL IVPB  250 mg Intravenous Q48H Shane Crutch, MD      . dextrose 10 %  infusion   Intravenous Continuous Shane Crutch, MD 25 mL/hr at 12/07/15 0700    . epoetin alfa (EPOGEN,PROCRIT) injection 10,000 Units  10,000 Units Intravenous Q M,W,F-HD Shane Crutch, MD   10,000 Units at 12/07/15 1013  . escitalopram (LEXAPRO) tablet 10 mg  10 mg Per Tube Daily Shane Crutch, MD   10 mg at 12/07/15 1137  . feeding supplement (PRO-STAT SUGAR FREE 64) liquid 30 mL  30 mL Per Tube 6 X Daily Shane Crutch, MD   30 mL at 12/07/15 1733  . feeding supplement (VITAL AF 1.2 CAL) liquid 1,000 mL  1,000 mL Per Tube Continuous Shane Crutch, MD 30 mL/hr at 12/07/15 0700 1,000 mL at 12/07/15 0700  . free water 20 mL  20 mL Per Tube 6 times per day Shane Crutch, MD   20 mL at 12/07/15 1618  . hydrocortisone (CORTEF) tablet 10 mg  10 mg Per Tube BID Shane Crutch, MD   10 mg at 12/07/15 1137  . HYDROmorphone (DILAUDID) injection 0.5-1 mg  0.5-1 mg Intravenous Q2H PRN Shane Crutch, MD   1 mg at 12/07/15 1225  . lidocaine (LIDODERM) 5 % 1 patch  1 patch Transdermal Q24H Shane Crutch, MD      . loperamide (IMODIUM) capsule 2 mg  2 mg Oral PRN Shane Crutch, MD   2 mg at 12/07/15 0216  . midodrine (PROAMATINE) tablet 10 mg  10 mg Per Tube TID WC Shane Crutch, MD   10 mg at 12/07/15 1618  .  mirtazapine (REMERON SOL-TAB) disintegrating tablet 7.5 mg  7.5 mg Per Tube QHS Shane Crutch, MD   7.5 mg at 12/07/15 0002  . multivitamin liquid 5 mL  5 mL Per Tube Daily Shane Crutch, MD   5 mL at 12/07/15 1137  . norepinephrine (LEVOPHED) 16 mg in dextrose 5 % 250 mL (0.064 mg/mL) infusion  0-40 mcg/min Intravenous Titrated Shane Crutch, MD 17.8 mL/hr at 12/07/15 1645 19 mcg/min at 12/07/15 1645  . ondansetron (ZOFRAN) injection 4 mg  4 mg Intravenous Q6H PRN Shane Crutch, MD      . oxyCODONE (ROXICODONE) 5 MG/5ML solution 5 mg  5 mg Per Tube Q6H PRN Shane Crutch, MD   5 mg at 12/07/15 1720   . pantoprazole sodium (PROTONIX) 40 mg/20 mL oral suspension 40 mg  40 mg Per Tube Daily Shane Crutch, MD   40 mg at 12/07/15 1140  . polyvinyl alcohol (LIQUIFILM TEARS) 1.4 % ophthalmic solution 1 drop  1 drop Both Eyes PRN Shane Crutch, MD      . sodium chloride 0.9 % injection 10-40 mL  10-40 mL Intracatheter PRN Shane Crutch, MD      . sodium chloride 0.9 % injection 10-40 mL  10-40 mL Intracatheter Q12H Shane Crutch, MD   10 mL at 12/07/15 1141  . sodium hypochlorite (DAKIN'S 1/4 STRENGTH) topical solution   Irrigation BID Shane Crutch, MD      . tigecycline (TYGACIL) 50 mg in sodium chloride 0.9 % 100 mL IVPB  50 mg Intravenous Q12H Shane Crutch, MD   50 mg at 12/07/15 1733  . tobramycin (PF) (TOBI) nebulizer solution 300 mg  300 mg Nebulization BID Shane Crutch, MD   300 mg at 12/07/15 0859  . zinc oxide 20 % ointment   Topical PRN Shane Crutch, MD      .  Medications Prior to Admission  Medication Sig Dispense Refill  . b complex-vitamin c-folic acid (NEPHRO-VITE) 0.8 MG TABS tablet Take 1 tablet by mouth every evening.    . carvedilol (COREG) 25 MG tablet Take 25 mg by mouth every evening.    . collagenase (SANTYL) ointment Apply 1 application topically daily.    . Darbepoetin Alfa (ARANESP) 100 MCG/0.5ML SOSY injection Inject 100 mcg into the skin every 7 (seven) days. On Saturday at dialysis    . escitalopram (LEXAPRO) 5 MG tablet Take 5 mg by mouth at bedtime.    . heparin 5000 UNIT/ML injection Inject 5,000 Units into the skin every 8 (eight) hours.    . insulin lispro (HUMALOG) 100 UNIT/ML injection Inject into the skin 4 (four) times daily -  before meals and at bedtime. Per sliding scale    . insulin lispro (HUMALOG) 100 UNIT/ML injection Inject into the skin 3 (three) times daily. Per sliding scale    . lidocaine (LIDODERM) 5 % Place 1 patch onto the skin daily. Remove & Discard patch within 12 hours or as directed  by MD    . lisinopril (PRINIVIL,ZESTRIL) 40 MG tablet Take 40 mg by mouth every evening.    . megestrol (MEGACE) 400 MG/10ML suspension Take 800 mg by mouth 2 (two) times daily.    . mirtazapine (REMERON) 15 MG tablet Take 15 mg by mouth at bedtime.    Marland Kitchen oxyCODONE (OXY IR/ROXICODONE) 5 MG immediate release tablet Take 5 mg by mouth every 8 (eight) hours.    Marland Kitchen oxyCODONE (OXY IR/ROXICODONE) 5 MG immediate release tablet Take 5 mg by mouth every 8 (eight) hours  as needed for moderate pain.    . potassium chloride SA (K-DUR,KLOR-CON) 20 MEQ tablet Take 20 mEq by mouth every evening.    . predniSONE (DELTASONE) 10 MG tablet Take 10 mg by mouth 2 (two) times daily.    . psyllium (METAMUCIL) 58.6 % powder Take 1 packet by mouth 2 (two) times daily.    Marland Kitchen thiamine (VITAMIN B-1) 100 MG tablet Take 100 mg by mouth every evening.    . [EXPIRED] vancomycin (VANCOCIN) 50 mg/mL oral solution Take 125 mg by mouth every evening.      Allergies:  Allergies  Allergen Reactions  . Contrast Media [Iodinated Diagnostic Agents] Anaphylaxis  . Ampicillin Rash    PMH:  Past Medical History  Diagnosis Date  . Obesity   . Dyslipidemia   . Hypertension   . Coronary artery disease     s/p BMS 2010 LAD  . Dysrhythmia     ventricular tachycardia resolved after LAD stent and beta blocker  . Diabetes mellitus     with retinopathy, neuropathy and microalbuminemia  . ESRD (end stage renal disease) on dialysis     Fam Hx:  Family History  Problem Relation Age of Onset  . Hypertension Father   . Diabetes Father   . Cancer Mother   . Diabetes Paternal Grandfather   . Hypertension Paternal Grandfather   . Diabetes Paternal Grandmother   . Hypertension Paternal Grandmother   . Cancer Maternal Grandmother     colon ca    Soc Hx:  Social History   Social History  . Marital Status: Married    Spouse Name: N/A  . Number of Children: N/A  . Years of Education: N/A   Occupational History  . Not on file.    Social History Main Topics  . Smoking status: Never Smoker   . Smokeless tobacco: Never Used  . Alcohol Use: No     Comment: occassional  . Drug Use: No  . Sexual Activity: Yes   Other Topics Concern  . Not on file   Social History Narrative    PSH:  Past Surgical History  Procedure Laterality Date  . Cholecystectomy  1990  . Pci lad  12/2010  . Colonoscopy  08/2011  . Left heart catheterization with coronary angiogram N/A 01/28/2012    Procedure: LEFT HEART CATHETERIZATION WITH CORONARY ANGIOGRAM;  Surgeon: Lesleigh Noe, MD;  Location[facility] CATH LAB;  Service: Cardiovascular;  Laterality: N/A;  . Tracheostomy    . Gastric bypass    . Procedures since admission: No admission procedures for hospital encounter.  ROS: Unable to obtain. PHYSICAL EXAM  Vitals: Blood pressure 109/52, pulse 41, temperature 97.5 F (36.4 C), temperature source Rectal, resp. rate 0, height 5\' 9"  (1.753 m), weight 102 kg (224 lb 13.9 oz), SpO2 100 %.. General: Sedated patient on ventilator, moderately obese Mood: Unable to assess. Patient sedated, on the vent. Orientation: Patient sedated, on the vent. Vocal Quality: Unable to assess. Patient sedated, on the vent. head and Face: NCAT. No facial asymmetry. No visible skin lesions. No significant facial scars. No tenderness with sinus percussion. Facial strength normal and symmetric. Ears: External ears with normal landmarks, no lesions. External auditory canals free of infection, cerumen impaction or lesions. Tympanic membranes intact with good landmarks and normal mobility on pneumatic otoscopy. No middle ear effusion. Hearing: Unable to assess. Patient sedated, on the vent. Nose: External nose normal with midline dorsum and no lesions or deformity. Nasal Cavity reveals essentially  midline septum with normal inferior turbinates. No significant mucosal congestion or erythema. Nasal secretions are minimal and clear. No polyps seen on anterior  rhinoscopy. Oral Cavity/ Oropharynx: Lips are normal with no lesions. Teeth no frank dental caries. Gingiva healthy with no lesions or gingivitis. Oropharynx including tongue, buccal mucosa, floor of mouth, hard and soft palate, uvula and posterior pharynx free of exudates, erythema or lesions with normal symmetry and hydration.  Indirect Laryngoscopy/Nasopharyngoscopy: Visualization of the larynx, hypopharynx and nasopharynx is not possible in this setting with routine examination. Neck: Supple and symmetric with no palpable masses, tenderness or crepitance. The trachea is midline. Thyroid gland is soft, nontender and symmetric with no masses or enlargement. Parotid and submandibular glands are soft, nontender and symmetric, without masses. Courtney [name] site shows a well formed trach site, no significant granulation tissue, and a #6 Shiley trach in place. The cuff is up and appears to hold air. Some air escape is noted with ventilation, as I can hear air escaping into the throat. Adding air to the cuff does resolve this. Lymphatic: Cervical lymph nodes are without palpable lymphadenopathy or tenderness. Respiratory: Breathing with ventilatory assistance Cardiovascular: Carotid pulse shows regular rate and rhythm Neurologic: Unable to assess. Patient sedated, on the vent. Eyes: Unable to assess. Patient sedated, on the vent.  MEDICAL DECISION MAKING: Data Review:  Results for orders placed or performed during the hospital encounter of 08/17/2015 (from the past 48 hour(s))  Glucose, capillary     Status: Abnormal   Collection Time: 12/05/15 11:48 PM  Result Value Ref Range   Glucose-Capillary 123 (H) 65 - 99 mg/dL  Glucose, capillary     Status: Abnormal   Collection Time: 12/06/15  7:26 AM  Result Value Ref Range   Glucose-Capillary 125 (H) 65 - 99 mg/dL  Glucose, capillary     Status: Abnormal   Collection Time: 12/06/15 11:52 AM  Result Value Ref Range   Glucose-Capillary 146 (H) 65 - 99 mg/dL   Glucose, capillary     Status: Abnormal   Collection Time: 12/06/15  5:03 PM  Result Value Ref Range   Glucose-Capillary 150 (H) 65 - 99 mg/dL  Glucose, capillary     Status: Abnormal   Collection Time: 12/07/15  1:41 AM  Result Value Ref Range   Glucose-Capillary 134 (H) 65 - 99 mg/dL  CBC     Status: Abnormal   Collection Time: 12/07/15  4:33 AM  Result Value Ref Range   WBC 18.6 (H) 3.6 - 11.0 K/uL   RBC 2.72 (L) 3.80 - 5.20 MIL/uL   Hemoglobin 8.2 (L) 12.0 - 16.0 g/dL   HCT 16.1 (L) 09.6 - 04.5 %   MCV 101.0 (H) 80.0 - 100.0 fL   MCH 30.0 26.0 - 34.0 pg   MCHC 29.7 (L) 32.0 - 36.0 g/dL   RDW 40.9 (H) 81.1 - 91.4 %   Platelets 59 (L) 150 - 440 K/uL  Glucose, capillary     Status: Abnormal   Collection Time: 12/07/15  7:31 AM  Result Value Ref Range   Glucose-Capillary 139 (H) 65 - 99 mg/dL  Glucose, capillary     Status: Abnormal   Collection Time: 12/07/15 11:40 AM  Result Value Ref Range   Glucose-Capillary 142 (H) 65 - 99 mg/dL  Glucose, capillary     Status: Abnormal   Collection Time: 12/07/15  4:29 PM  Result Value Ref Range   Glucose-Capillary 130 (H) 65 - 99 mg/dL  . No results found.Marland Kitchen  ASSESSMENT: Patient with respiratory failure, vent dependent, requiring high vent pressures, and some trach leak  PLAN: Replaced the #6 Shiley with a #8 Shiley cuffed trach with minimal difficulty. For her size, a #8 trach should be adequate. No leak note after inflation of cuff.   Sandi Mealy, MD 12/07/2015 5:40 PM

## 2015-12-07 NOTE — Progress Notes (Signed)
HD TX Initiation 

## 2015-12-07 NOTE — Progress Notes (Signed)
Pre HD Tx Assessment 

## 2015-12-07 NOTE — Progress Notes (Signed)
PULMONARY / CRITICAL CARE MEDICINE   Name: Courtney [name] MRN: [id number] DOB: 1966-07-12    ADMISSION DATE:  2015/08/17  BRIEF HISTORY: 49 AAF who has been in medical facilities (hosp, LTAC, rehab) for 2 yrs following gastric bypass surgery with multiple complications. Now with chronic trach, ESRD, profound debilitation, severe sacral pressure ulcer. Was seemingly making progress and transferred to rehab facility approx one week prior to this admission. She was sent to [facility] ED with AMS and hypotension. Working dx of severe sepsis/septic shock due to infected sacral pressure ulcer. Since admission. Her course is been very complicated with numerous complications including septic shock and GI bleeding. Now with failure to wean from vent and possible MI event, repeat ECHO EF 30-35, possible old MI, severe hypokinesis of the anterior and anteroseptal myocardium   SUBJ:   The patient is minimally responsive today, 1/18.  there are no significant changes in the patient today. She continues to do poorly and is minimally responsive.  Summary of MAJOR EVENTS/TEST RESULTS: Admission 02/07/14-05/07/14 Admission 07/21/14-09/06/14 Discharged to [facility]. Pt had palliative consult at that time, were asked to sign off by husband.  09/23 CT head: NAD 09/23 EEG: no epileptiform activity 09/23 PRBCs for Hgb 6.4 09/24 bedside debridement of sacral wound. Abscess drained 09/25 Off vasopressors. More alert. No distress. Worsening thrombocytopenia. Vanc DC'd 09/29 Dr. Sampson Goon (I.D) excused from the case by patient's husband. 10/02 MRI -multiple infarcts 10/03 tracheal bleeding- transfused platelets 10/03 hospitalist service excused from the case by patient's husband 10/03 Echocardiogram ejection fraction was 55-60%, pulmonary systolic pressure was 39 mmHg 82/95 restart TF's at lower rate, attempt reg HD  10/12 Transferred to med-surg floor. Remains on PCCM service 10/14 SLP eval: pt unable to tolerate  PMV adequately 10/17-will re-attempt PM valve-discussed with Speech therapist 10/18 passed swallow eval-start pureed thick foods no thin liquids-continue NG feeds 10/19 transferred to step down for sepsis/aspiration pneumonia 10/19 cxr shows RLL opacity 10/21 sacral decub debride at bedside by surgery 10/22 started back on vasopressors while on HD 10/27 CT with osteo, R hip fx, unable to identify tip of dubhoff tube - sent for fluoro study 10/28 Ortho consultation: I do not feel that she is a surgical candidate. Therefore, I feel that it would best to manage this fracture nonsurgically and allow it to heal by itself over time, which it should.  10/28 Gen Surg consultation: Due to the lack of free air or free flow of contrast and the peritoneum there is no indication for any surgical intervention on this. Would recommend pulling back the feeding tube 1-2 cm. Would recheck an abdominal film to confirm no pneumoperitoneum in the morning. Absence of any changes okay to continue using Dobbhoff for feeding and medications. 10/28 gastrograffin study: The study confirms that the feeding tube pes perforated through the duodenum and the tip is within a cavity that fills with injected contrast. The cavity does appear walled off 11/4 refuses oral feeds, continue TF's CT reviewed: T8-T9 discitis/osteomyelitis, RT HIP FRACTURE 11/5 placed back on vasopressors, placed back on Vent due to resp acidosis, levophed turned off 11/6 afternoon 11/5 PM Trach changed out due to cuff leak, #6 Shiley cuffed, 11/6 dubhoff occluded, removed and replaced, new dubhoff shows tube in the antral stomach 11/15 refusing to take oral feeds 11/18 placed back on Vent for increased WOB,SOB. 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 10/18/2015; the patient developed a  new left basal pneumonia; with recurrent sepsis.  10/19/2015; fevers resolved with IV  acetaminophen. Tube feeds changed from vital to Jevity.  12/7 CXR with stable b/l opacities.  12/12 elevated K s/p HD-remains on AC mode 12/16-vomiting-hold feeds and re-assess in next 24 hrs 12/17- TF restarted at 1/2 goal (goal = 50cc/per) 12/18- elevated wbc, fever (101.4), recultured, restarted on ceftaz 12/19 Pericardial friction rub. Echocardiogram: LVEF 30-35%, anteroseptal hypokinesis or akinesis c/w anterior wall MI 12/20 Dr Sung Amabile discussed new echocardiogram findings with pt's husband and explained that this likely represents an anterior wall MI sometime in the previous couple of weeks. It was explained that she is not a candidate for any cardiology intervention. Code status was again discussed. Husband expressed understanding that she was not going to survive this illness in any favorable way but continues to request that she be full code status based on what he believes to be her wishes 12/21 vomitied 50cc of TF - AM TF held, restart TF at 3pm  12/26 Persistent intermittent trach cuff leak. Distal XLT trach requested.  12/27 15 beat NSVT 12/28 patient refused HD and refused Bath time 12/29 D10 infusion for hypoglycemia, TF's on hold due to intolerance 12/30 CODE BLUE  VTACH 01/01 transfuse 2 units of blood 01/02 increased respiratory distress, copious purulent secretions 01/02 LE venous US: No DVT 01/03 Repeat TTE: The cavity size was mildly dilated. Systolic function was moderately to severely reduced. The estimated ejection fraction was in the range of 30% to 35%. Diffuse hypokinesis. Moderate to severe hypokinesis of the anteroseptal, anterior, and apical myocardium. Hypokinesis of the anterior myocardium 01/05 Hypothermic - Bair Hugger ordered 01/10-placed back on vasopressors for septic shock 1/11-failed SBT due to resp muscle fatigue 1/13-worsening shock and hypoxia, agonal respirations  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 Pneumoniae, P. Mirabilis, VRE Wound 9/26 >> No growth Blood 9/23 >> NEG CDiff 9/27>>neg Stool Cx 10/15>> negative Cdiff 10/25>>neg Trach Aspirate 10/28>> light growth pseudomonas Blood 10/28 >> 1/2 GPC >>  Sputum cultures obtained 09/22/15 due to mucus plugging>>Pseudomonas BONE TISSUE Cx 11/3>>E.coli (ESBL), k. Pneumonia (daptomycin and septra) Bld Cx 11/27>>negative thus far.  Trach Asp 11/27>> grew out Pseudomonas, and Klebsiella pneumonia. Trach Asp 12/18>> MDR Klebsiella, Pseudomonas Bld Cx 12/18>> NEG 12/26 resp culture >> MDR pseudomonas  ANTIMICROBIALS:  Aztreonam 9/23 >> 9/24 Vanc 9/23 >> 9/25 Vanc 9/26>>9/27 Daptomycin 9/27>> 10/12, 10/28>> off Meropenem 9/23 >> 10/14, 10/28 >> 10/31 Zosyn 11/27,>> 11/30. Levaquin 11/29>> 11/29. Ceftaz 12/1>>12/11, 12/18>> 12/24 Colisitin 12/26 >>  Tigecycline 12/26 >>  Nebulized tobramycin 12/30 >>  VITAL SIGNS: Temp:  [96.1 F (35.6 C)-97.7 F (36.5 C)] 97.7 F (36.5 C) (01/18 0800) Pulse Rate:  [31-79] 76 (01/18 0800) Resp:  [0-27] 15 (01/18 0800) BP: (77-129)/(35-96) 120/56 mmHg (01/18 0800) SpO2:  [92 %-100 %] 97 % (01/18 0900) FiO2 (%):  [35 %] 35 % (01/18 0900) HEMODYNAMICS:   VENTILATOR SETTINGS: Vent Mode:  [-] PRVC FiO2 (%):  [35 %] 35 % Set Rate:  [14 bmp] 14 bmp Vt Set:  [500 mL] 500 mL PEEP:  [5 cmH20] 5 cmH20 Plateau Pressure:  [18 cmH20] 18 cmH20 INTAKE / OUTPUT:  Intake/Output Summary (Last 24 hours) at 12/07/15 1610 Last data filed at 12/07/15 0700  Gross per 24 hour  Intake 1675.3 ml  Output      0 ml  Net 1675.3 ml    Review of Systems  Unable to perform ROS: critical  illness   Physical Exam  Constitutional:  Critical ill appearance  HENT:  Head: Normocephalic and atraumatic.  Right Ear: External ear normal.  Left Ear: External ear normal.  Eyes: Conjunctivae are normal. Pupils are equal, round, and reactive to light. Right eye exhibits no discharge. Left eye exhibits no discharge.  Neck: Neck supple. No tracheal deviation present. No thyromegaly present.  Cardiovascular: Regular rhythm, normal heart sounds and intact distal pulses.   No murmur heard. Pulmonary/Chest: She has no wheezes.  Tracheostomy - on MV  Abdominal: Soft. Bowel sounds are normal. She exhibits no distension.  Musculoskeletal: She exhibits edema.  Ext cool, trace symmetric edema, marked muscle wasting Severe sacral decubitus ulcer- to the spine  Skin: Skin is warm.  Nursing note and vitals reviewed.   RASS 0, intermittent trach tube cuff leak,vent graphics look good, no air leak  HEENT; Cushingoid facies, temporal wasting, NGT present. Tube feeds are infusing. Trach site clean Diffuse rhonchi Reg with occ extrasystoles, no M Abdomen soft, + BS Ext cool, trace symmetric edema, marked muscle wasting Severe sacral decubitus ulcer- to the spine  LABS:  CBC  Recent Labs Lab 12/03/15 0552 12/05/15 0538 12/07/15 0433  WBC 15.5* 15.1* 18.6*  HGB 8.5* 7.2* 8.2*  HCT 28.0* 23.7* 27.5*  PLT 58* 62* 59*   Coag's No results for input(s): APTT, INR in the last 168 hours. BMET  Recent Labs Lab 11/30/15 1633 12/02/15 1609 12/05/15 0538  NA 129* 132* 131*  K 3.9 3.7 3.9  CL 97* 96* 96*  CO2 27 29 29   BUN 95* 95* 106*  CREATININE 1.38* 1.34* 1.36*  GLUCOSE 140* 186* 124*   Electrolytes  Recent Labs Lab 11/30/15 1633 12/02/15 1609 12/05/15 0538  CALCIUM 8.0* 8.2* 8.0*  MG  --   --  2.0  PHOS  --  5.6* 5.4*   Sepsis Markers No results for input(s): LATICACIDVEN, PROCALCITON, O2SATVEN in the last 168 hours. ABG No results for input(s): PHART, PCO2ART,  PO2ART in the last 168 hours. Liver Enzymes  Recent Labs Lab 12/02/15 1609 12/05/15 0538  AST  --  43*  ALT  --  34  ALKPHOS  --  2594*  BILITOT  --  1.2  ALBUMIN 1.6* 1.7*   Cardiac Enzymes No results for input(s): TROPONINI, PROBNP in the last 168 hours. Glucose  Recent Labs Lab 12/05/15 2348 12/06/15 0726 12/06/15 1152 12/06/15 1703 12/07/15 0141 12/07/15 0731  GLUCAP 123* 125* 146* 150* 134* 139*    Imaging No results found.  ASSESSMENT / PLAN: IMPRESSION: Very prolonged and complicated hospitalization after gastric bypass surgery 2014 Never returned home ESRD-hd as tolerated Recurrent severe sepsis - PAN resistant Klebisella in sputum Recurrent PNA-now with pseudomonas H/O C diff H/O VTE DM2 - controlled Episodic hypoglycemia Chronic steroid therapy, for a history of of adrenal insufficiency Incidental finding of R hip fx - conservative mgmt. 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 Husband declined TEE  Profound deconditioning Chronic pain New finding of pericardial friction rub 12/19 - resolved New finding of cardiomyopathy, likely post MI 12/19  PAF/flutter Sinus bradycardia HCAP with MDR Donalee Citrin and pseudomonas Chronic anemia Severe protein - calorie malnutrition TF intolerance Thrombocytopenia 01/13 - may need to consider stopping heparin and ASA if plts continues to be low on lab recheck in the AM.  Cont vent support - settings reviewed and/or adjusted Cont vent bundle Cont HD per renal Monitor BMET intermittently Monitor I/Os Correct electrolytes as indicated DVT px: SQ heparin Monitor CBC intermittently Transfuse per usual  guidelines Monitor temp, WBC count Micro and abx as above ID following - appreciate recs Cont Amiodarone - 100 mg per tube daily DC metoprolol Cont opiates PRN Cont D10 Infusion for hypoglycemia TFs @  30 cc/hr Vasopressors to keep MAP>60   Updated husband via telephone  - he is updated about the patient's poor respiratory and neuro status. 2. Continue the current course of therapy.  Prognosis is very poor without hope for functional recovery. Patient is dialysis and Vent dependent with severe sacral decub ulcer with chronic bone infection and spinal infection with underlying CHF and CVA with recurrent pneumonia.   This patient in my opinion is deemed Futile. Left message on husbands VM that there are no significant changes in the patient today. She continues to do poorly and is minimally responsive.    Wells Guiles, M.D.   Critical Care Attestation.  I have personally obtained a history, examined the patient, evaluated laboratory and imaging results, formulated the assessment and plan and placed orders. The Patient requires high complexity decision making for assessment and support, frequent evaluation and titration of therapies, application of advanced monitoring technologies and extensive interpretation of multiple databases. The patient has critical illness that could lead imminently to failure of 1 or more organ systems and requires the highest level of physician preparedness to intervene.  Critical Care Time devoted to patient care services described in this note is 35 minutes and is exclusive of time spent in procedures.

## 2015-12-07 NOTE — Progress Notes (Signed)
Called [facility] and spoke with Nurse Vikki Ports. Informed of Pt's BP in need of frequent titration to maintain MAP>65. Nurse Vikki Ports spoke with [facility] physician Dr. Vassie Loll which stated to titrate via systolic BP > 90.

## 2015-12-07 NOTE — Progress Notes (Signed)
Subjective:  Patient due for hemodialysis today. Blood pressure appears to be a bit better this a.m. Overall remains critically ill without significant change however. Still on pressors. Continues on tube feeds. Objective:  Vital signs in last 24 hours:  Temp:  [96.1 F (35.6 C)-97.7 F (36.5 C)] 97.7 F (36.5 C) (01/18 0800) Pulse Rate:  [31-79] 76 (01/18 0800) Resp:  [0-27] 15 (01/18 0800) BP: (77-129)/(35-96) 120/56 mmHg (01/18 0800) SpO2:  [92 %-100 %] 97 % (01/18 0900) FiO2 (%):  [35 %] 35 % (01/18 0900)  Weight change:  Filed Weights   11/22/15 0500  Weight: 102 kg (224 lb 13.9 oz)    Intake/Output: I/O last 3 completed shifts: In: 2760.7 [I.V.:1281.7; NG/GT:1279; IV Piggyback:200] Out: -    Intake/Output this shift:     Physical Exam: General: critically ill   Head/ENT: Moist oral mucosal membrane, +NG tube  Eyes: Anicteric, eyes open  Neck: Tracheostomy in place  Lungs:  vent assisted , diffuse crackles  Heart: S1S2 no rubs  Abdomen:  Soft, NT, BS present  Extremities: Anasarca, 3+ in LE's  Neurologic: Eyes open but not following commands   Access:  R IJ permcath.       Basic Metabolic Panel:  Recent Labs Lab 11/30/15 1633 12/02/15 1609 12/05/15 0538  NA 129* 132* 131*  K 3.9 3.7 3.9  CL 97* 96* 96*  CO2 GLUCOSE 140* 186* 124*  BUN 95* 95* 106*  CREATININE 1.38* 1.34* 1.36*  CALCIUM 8.0* 8.2* 8.0*  MG  --   --  2.0  PHOS  --  5.6* 5.4*    Liver Function Tests:  Recent Labs Lab 12/02/15 1609 12/05/15 0538  AST  --  43*  ALT  --  34  ALKPHOS  --  2594*  BILITOT  --  1.2  PROT  --  4.9*  ALBUMIN 1.6* 1.7*   No results for input(s): LIPASE, AMYLASE in the last 168 hours. No results for input(s): AMMONIA in the last 168 hours.  CBC:  Recent Labs Lab 11/30/15 1633 12/02/15 1609 12/03/15 0552 12/05/15 0538 12/07/15 0433  WBC 13.3* 13.5* 15.5* 15.1* 18.6*  NEUTROABS 11.4*  --   --  12.7*  --   HGB 8.9* 8.8* 8.5*  7.2* 8.2*  HCT 29.7* 30.4* 28.0* 23.7* 27.5*  MCV 97.5 98.8 98.0 98.7 101.0*  PLT 68* 58* 58* 62* 59*    Cardiac Enzymes: No results for input(s): CKTOTAL, CKMB, CKMBINDEX, TROPONINI in the last 168 hours.  BNP: Invalid input(s): POCBNP  CBG:  Recent Labs Lab 12/06/15 0726 12/06/15 1152 12/06/15 1703 12/07/15 0141 12/07/15 0731  GLUCAP 125* 146* 150* 134* 139*    Microbiology: Results for orders placed or performed during the hospital encounter of Sep 09, 2015  Blood Culture (routine x 2)     Status: None   Collection Time: September 09, 2015  8:51 AM  Result Value Ref Range Status   Specimen Description BLOOD [name]  Final   Special Requests BOTTLES DRAWN AEROBIC AND ANAEROBIC  3CC  Final   Culture NO GROWTH 5 DAYS  Final   Report Status 08/17/2015 FINAL  Final  Blood Culture (routine x 2)     Status: None   Collection Time: Sep 09, 2015  9:20 AM  Result Value Ref Range Status   Specimen Description BLOOD LEFT ARM  Final   Special Requests BOTTLES DRAWN AEROBIC AND ANAEROBIC  1CC  Final   Culture NO GROWTH 5 DAYS  Final   Report  Status 08/17/2015 FINAL  Final  Wound culture     Status: None   Collection Time: 08/04/2015  9:20 AM  Result Value Ref Range Status   Specimen Description DECUBITIS  Final   Special Requests Normal  Final   Gram Stain   Final    FEW WBC SEEN MANY GRAM NEGATIVE RODS RARE GRAM POSITIVE COCCI    Culture   Final    HEAVY GROWTH ESCHERICHIA COLI MODERATE GROWTH ENTEROBACTER AEROGENES PROTEUS MIRABILIS HEAVY GROWTH ENTEROCOCCUS SPECIES VRE HAVE INTRINSIC RESISTANCE TO MOST COMMONLY USED ANTIBIOTICS AND THE ABILITY TO ACQUIRE RESISTANCE TO MOST AVAILABLE ANTIBIOTICS.    Report Status 08/16/2015 FINAL  Final   Organism ID, Bacteria ESCHERICHIA COLI  Final   Organism ID, Bacteria ENTEROBACTER AEROGENES  Final   Organism ID, Bacteria PROTEUS MIRABILIS  Final   Organism ID, Bacteria ENTEROCOCCUS SPECIES  Final      Susceptibility   Enterobacter aerogenes -  MIC*    CEFTAZIDIME <=1 SENSITIVE Sensitive     CEFAZOLIN >=64 RESISTANT Resistant     CEFTRIAXONE <=1 SENSITIVE Sensitive     CIPROFLOXACIN <=0.25 SENSITIVE Sensitive     GENTAMICIN <=1 SENSITIVE Sensitive     IMIPENEM 1 SENSITIVE Sensitive     TRIMETH/SULFA <=20 SENSITIVE Sensitive     * MODERATE GROWTH ENTEROBACTER AEROGENES   Escherichia coli - MIC*    AMPICILLIN <=2 SENSITIVE Sensitive     CEFTAZIDIME <=1 SENSITIVE Sensitive     CEFAZOLIN <=4 SENSITIVE Sensitive     CEFTRIAXONE <=1 SENSITIVE Sensitive     CIPROFLOXACIN <=0.25 SENSITIVE Sensitive     GENTAMICIN <=1 SENSITIVE Sensitive     IMIPENEM <=0.25 SENSITIVE Sensitive     TRIMETH/SULFA <=20 SENSITIVE Sensitive     * HEAVY GROWTH ESCHERICHIA COLI   Proteus mirabilis - MIC*    AMPICILLIN >=32 RESISTANT Resistant     CEFTAZIDIME <=1 SENSITIVE Sensitive     CEFAZOLIN 8 SENSITIVE Sensitive     CEFTRIAXONE <=1 SENSITIVE Sensitive     CIPROFLOXACIN <=0.25 SENSITIVE Sensitive     GENTAMICIN <=1 SENSITIVE Sensitive     IMIPENEM 1 SENSITIVE Sensitive     TRIMETH/SULFA <=20 SENSITIVE Sensitive     * PROTEUS MIRABILIS   Enterococcus species - MIC*    AMPICILLIN >=32 RESISTANT Resistant     VANCOMYCIN >=32 RESISTANT Resistant     GENTAMICIN SYNERGY SENSITIVE Sensitive     TETRACYCLINE Value in next row Resistant      RESISTANT>=16    * HEAVY GROWTH ENTEROCOCCUS SPECIES  MRSA PCR Screening     Status: None   Collection Time: 07/27/2015  2:38 PM  Result Value Ref Range Status   MRSA by PCR NEGATIVE NEGATIVE Final    Comment:        The GeneXpert MRSA Assay (FDA approved for NASAL specimens only), is one component of a comprehensive MRSA colonization surveillance program. It is not intended to diagnose MRSA infection nor to guide or monitor treatment for MRSA infections.   Blood culture (single)     Status: None   Collection Time: 07/27/2015  3:36 PM  Result Value Ref Range Status   Specimen Description BLOOD RIGHT ASSIST  CONTROL  Final   Special Requests BOTTLES DRAWN AEROBIC AND ANAEROBIC  Final   Culture NO GROWTH 5 DAYS  Final   Report Status 08/17/2015 FINAL  Final  Wound culture     Status: None   Collection Time: 08/13/15 12:37 PM  Result Value Ref  Range Status   Specimen Description WOUND  Final   Special Requests Normal  Final   Gram Stain   Final    FEW WBC SEEN TOO NUMEROUS TO COUNT GRAM NEGATIVE RODS FEW GRAM POSITIVE COCCI    Culture   Final    HEAVY GROWTH ESCHERICHIA COLI MODERATE GROWTH PROTEUS MIRABILIS LIGHT GROWTH KLEBSIELLA PNEUMONIAE MODERATE GROWTH ENTEROCOCCUS GALLINARUM CRITICAL RESULT CALLED TO, READ BACK BY AND VERIFIED WITH: [facility] BORBA AT 1042 08/16/15 DV    Report Status 08/17/2015 FINAL  Final   Organism ID, Bacteria ESCHERICHIA COLI  Final   Organism ID, Bacteria PROTEUS MIRABILIS  Final   Organism ID, Bacteria KLEBSIELLA PNEUMONIAE  Final   Organism ID, Bacteria ENTEROCOCCUS GALLINARUM  Final      Susceptibility   Escherichia coli - MIC*    AMPICILLIN >=32 RESISTANT Resistant     CEFTAZIDIME 4 RESISTANT Resistant     CEFAZOLIN >=64 RESISTANT Resistant     CEFTRIAXONE 16 RESISTANT Resistant     GENTAMICIN 2 SENSITIVE Sensitive     IMIPENEM >=16 RESISTANT Resistant     TRIMETH/SULFA <=20 SENSITIVE Sensitive     Extended ESBL POSITIVE Resistant     PIP/TAZO Value in next row Resistant      RESISTANT>=128    CIPROFLOXACIN Value in next row Sensitive      SENSITIVE<=0.25    * HEAVY GROWTH ESCHERICHIA COLI   Klebsiella pneumoniae - MIC*    AMPICILLIN Value in next row Resistant      SENSITIVE<=0.25    CEFTAZIDIME Value in next row Resistant      SENSITIVE<=0.25    CEFAZOLIN Value in next row Resistant      SENSITIVE<=0.25    CEFTRIAXONE Value in next row Resistant      SENSITIVE<=0.25    CIPROFLOXACIN Value in next row Resistant      SENSITIVE<=0.25    GENTAMICIN Value in next row Sensitive      SENSITIVE<=0.25    IMIPENEM Value in next row Resistant       SENSITIVE<=0.25    TRIMETH/SULFA Value in next row Resistant      SENSITIVE<=0.25    PIP/TAZO Value in next row Resistant      RESISTANT>=128    * LIGHT GROWTH KLEBSIELLA PNEUMONIAE   Proteus mirabilis - MIC*    AMPICILLIN Value in next row Resistant      RESISTANT>=128    CEFTAZIDIME Value in next row Sensitive      RESISTANT>=128    CEFAZOLIN Value in next row Sensitive      RESISTANT>=128    CEFTRIAXONE Value in next row Sensitive      RESISTANT>=128    CIPROFLOXACIN Value in next row Sensitive      RESISTANT>=128    GENTAMICIN Value in next row Sensitive      RESISTANT>=128    IMIPENEM Value in next row Sensitive      RESISTANT>=128    TRIMETH/SULFA Value in next row Sensitive      RESISTANT>=128    PIP/TAZO Value in next row Sensitive      SENSITIVE<=4    * MODERATE GROWTH PROTEUS MIRABILIS   Enterococcus gallinarum - MIC*    AMPICILLIN Value in next row Resistant      SENSITIVE<=4    GENTAMICIN SYNERGY Value in next row Sensitive      SENSITIVE<=4    CIPROFLOXACIN Value in next row Resistant      RESISTANT>=8    TETRACYCLINE Value in next row  Resistant      RESISTANT>=16    * MODERATE GROWTH ENTEROCOCCUS GALLINARUM  Wound culture     Status: None   Collection Time: 08/15/15  2:53 PM  Result Value Ref Range Status   Specimen Description WOUND  Final   Special Requests NONE  Final   Gram Stain FEW WBC SEEN NO ORGANISMS SEEN   Final   Culture NO GROWTH 3 DAYS  Final   Report Status 08/18/2015 FINAL  Final  C difficile quick scan w PCR reflex     Status: None   Collection Time: 08/17/15 11:34 AM  Result Value Ref Range Status   C Diff antigen NEGATIVE NEGATIVE Final   C Diff toxin NEGATIVE NEGATIVE Final   C Diff interpretation Negative for C. difficile  Final  Stool culture     Status: None   Collection Time: 09/03/15  3:51 PM  Result Value Ref Range Status   Specimen Description STOOL  Final   Special Requests Immunocompromised  Final   Culture    Final    NO SALMONELLA OR SHIGELLA ISOLATED No Pathogenic E. coli detected NO CAMPYLOBACTER DETECTED    Report Status 09/07/2015 FINAL  Final  C difficile quick scan w PCR reflex     Status: None   Collection Time: 09/13/15 12:51 AM  Result Value Ref Range Status   C Diff antigen NEGATIVE NEGATIVE Final   C Diff toxin NEGATIVE NEGATIVE Final   C Diff interpretation Negative for C. difficile  Final  Culture, blood (routine x 2)     Status: None   Collection Time: 09/16/15 11:24 AM  Result Value Ref Range Status   Specimen Description BLOOD LEFT HAND  Final   Special Requests BOTTLES DRAWN AEROBIC AND ANAEROBIC  1CC  Final   Culture NO GROWTH 5 DAYS  Final   Report Status 09/21/2015 FINAL  Final  Culture, blood (routine x 2)     Status: None   Collection Time: 09/16/15 12:23 PM  Result Value Ref Range Status   Specimen Description BLOOD RIGHT HAND  Final   Special Requests BOTTLES DRAWN AEROBIC AND ANAEROBIC  1CC  Final   Culture  Setup Time   Final    GRAM POSITIVE COCCI AEROBIC BOTTLE ONLY CRITICAL RESULT CALLED TO, READ BACK BY AND VERIFIED WITH: TESS THOMAS,RN 09/17/2015 0631 BY JRS.    Culture   Final    ENTEROCOCCUS FAECALIS AEROBIC BOTTLE ONLY VRE HAVE INTRINSIC RESISTANCE TO MOST COMMONLY USED ANTIBIOTICS AND THE ABILITY TO ACQUIRE RESISTANCE TO MOST AVAILABLE ANTIBIOTICS. CRITICAL RESULT CALLED TO, READ BACK BY AND VERIFIED WITH: CHERYL SMITH AT 4098 09/19/15 DV    Report Status 09/21/2015 FINAL  Final   Organism ID, Bacteria ENTEROCOCCUS FAECALIS  Final      Susceptibility   Enterococcus faecalis - MIC*    AMPICILLIN <=2 SENSITIVE Sensitive     LINEZOLID 2 SENSITIVE Sensitive     CIPROFLOXACIN Value in next row Resistant      RESISTANT>=8    TETRACYCLINE Value in next row Resistant      RESISTANT>=16    VANCOMYCIN Value in next row Resistant      RESISTANT>=32    GENTAMICIN SYNERGY Value in next row Resistant      RESISTANT>=32    * ENTEROCOCCUS FAECALIS   Culture, respiratory (NON-Expectorated)     Status: None   Collection Time: 09/16/15  3:50 PM  Result Value Ref Range Status   Specimen Description TRACHEAL ASPIRATE  Final  Special Requests Immunocompromised  Final   Gram Stain   Final    FEW WBC SEEN GOOD SPECIMEN - 80-90% WBCS RARE GRAM NEGATIVE RODS    Culture LIGHT GROWTH PSEUDOMONAS AERUGINOSA  Final   Report Status 09/19/2015 FINAL  Final   Organism ID, Bacteria PSEUDOMONAS AERUGINOSA  Final      Susceptibility   Pseudomonas aeruginosa - MIC*    CEFTAZIDIME 8 SENSITIVE Sensitive     CIPROFLOXACIN 2 INTERMEDIATE Intermediate     GENTAMICIN >=16 RESISTANT Resistant     IMIPENEM >=16 RESISTANT Resistant     PIP/TAZO Value in next row Sensitive      SENSITIVE32    CEFEPIME Value in next row Sensitive      SENSITIVE8    LEVOFLOXACIN Value in next row Resistant      RESISTANT>=8    * LIGHT GROWTH PSEUDOMONAS AERUGINOSA  Culture, expectorated sputum-assessment     Status: None   Collection Time: 09/22/15  2:09 PM  Result Value Ref Range Status   Specimen Description ENDOTRACHEAL  Final   Special Requests Normal  Final   Sputum evaluation THIS SPECIMEN IS ACCEPTABLE FOR SPUTUM CULTURE  Final   Report Status 09/24/2015 FINAL  Final  Culture, respiratory (NON-Expectorated)     Status: None   Collection Time: 09/22/15  2:09 PM  Result Value Ref Range Status   Specimen Description ENDOTRACHEAL  Final   Special Requests Normal Reflexed from [id number]  Final   Gram Stain   Final    FEW WBC SEEN FEW GRAM NEGATIVE RODS POOR SPECIMEN - LESS THAN 70% WBCS    Culture   Final    MODERATE GROWTH PSEUDOMONAS AERUGINOSA LIGHT GROWTH KLEBSIELLA PNEUMONIAE REFER TO SENSITIVITIES FROM PREVIOUS CULTURE FOR ORG 2    Report Status 10/01/2015 FINAL  Final   Organism ID, Bacteria PSEUDOMONAS AERUGINOSA  Final   Organism ID, Bacteria KLEBSIELLA PNEUMONIAE  Final      Susceptibility   Pseudomonas aeruginosa - MIC*    CEFTAZIDIME 4  SENSITIVE Sensitive     CIPROFLOXACIN 2 INTERMEDIATE Intermediate     GENTAMICIN >=16 RESISTANT Resistant     IMIPENEM >=16 RESISTANT Resistant     PIP/TAZO Value in next row Sensitive      SENSITIVE16    * MODERATE GROWTH PSEUDOMONAS AERUGINOSA  Tissue culture     Status: None   Collection Time: 09/24/15  6:44 AM  Result Value Ref Range Status   Specimen Description BONE  Final   Special Requests Normal  Final   Gram Stain MODERATE WBC SEEN FEW GRAM NEGATIVE RODS   Final   Culture   Final    MODERATE GROWTH ESCHERICHIA COLI MODERATE GROWTH KLEBSIELLA PNEUMONIAE ESBL-EXTENDED SPECTRUM BETA LACTAMASE-THE ORGANISM IS RESISTANT TO PENICILLINS, CEPHALOSPORINS AND AZTREONAM ACCORDING TO CLSI M100-S15 VOL.25 N01 JAN 2005. ORGANISM 1 This organism isolate is resistant to one or more antiotic agents in three or more antimicrobial categories.  Suggest Infectious Disease consult.   ORGANISM 2 CRITICAL RESULT CALLED TO, READ BACK BY AND VERIFIED WITH: RN Mardene Celeste LINDSAY 09/27/15 1005AM    Report Status 09/28/2015 FINAL  Final   Organism ID, Bacteria ESCHERICHIA COLI  Final   Organism ID, Bacteria KLEBSIELLA PNEUMONIAE  Final      Susceptibility   Escherichia coli - MIC*    AMPICILLIN >=32 RESISTANT Resistant     CEFTAZIDIME 4 RESISTANT Resistant     CEFAZOLIN >=64 RESISTANT Resistant     CEFTRIAXONE 8 RESISTANT Resistant  GENTAMICIN <=1 SENSITIVE Sensitive     IMIPENEM 8 RESISTANT Resistant     TRIMETH/SULFA <=20 SENSITIVE Sensitive     Extended ESBL POSITIVE Resistant     PIP/TAZO Value in next row Resistant      RESISTANT>=128    * MODERATE GROWTH ESCHERICHIA COLI   Klebsiella pneumoniae - MIC*    AMPICILLIN Value in next row Resistant      RESISTANT>=128    CEFTAZIDIME Value in next row Resistant      RESISTANT>=128    CEFAZOLIN Value in next row Resistant      RESISTANT>=128    CEFTRIAXONE Value in next row Resistant      RESISTANT>=128    CIPROFLOXACIN Value in next row  Resistant      RESISTANT>=128    GENTAMICIN Value in next row Resistant      RESISTANT>=128    IMIPENEM Value in next row Resistant      RESISTANT>=128    TRIMETH/SULFA Value in next row Sensitive      RESISTANT>=128    PIP/TAZO Value in next row Resistant      RESISTANT>=128    * MODERATE GROWTH KLEBSIELLA PNEUMONIAE  Anaerobic culture     Status: None   Collection Time: 09/24/15  3:55 PM  Result Value Ref Range Status   Specimen Description BONE  Final   Special Requests Normal  Final   Culture NO ANAEROBES ISOLATED  Final   Report Status 09/29/2015 FINAL  Final  Culture, fungus without smear     Status: None   Collection Time: 09/24/15  3:55 PM  Result Value Ref Range Status   Specimen Description BONE  Final   Special Requests Normal  Final   Culture NO FUNGUS ISOLATED AFTER 24 DAYS  Final   Report Status 10/18/2015 FINAL  Final  C difficile quick scan w PCR reflex     Status: None   Collection Time: 10/07/15  2:02 PM  Result Value Ref Range Status   C Diff antigen NEGATIVE NEGATIVE Final   C Diff toxin NEGATIVE NEGATIVE Final   C Diff interpretation Negative for C. difficile  Final  Culture, blood (routine x 2)     Status: None   Collection Time: 10/16/15  9:52 AM  Result Value Ref Range Status   Specimen Description BLOOD LEFT HAND  Final   Special Requests   Final    BOTTLES DRAWN AEROBIC AND ANAEROBIC  AER 6CC ANA 4CC   Culture NO GROWTH 6 DAYS  Final   Report Status 10/22/2015 FINAL  Final  Culture, blood (routine x 2)     Status: None   Collection Time: 10/16/15 12:25 PM  Result Value Ref Range Status   Specimen Description BLOOD LEFT HAND  Final   Special Requests BOTTLES DRAWN AEROBIC AND ANAEROBIC  5CC  Final   Culture NO GROWTH 6 DAYS  Final   Report Status 10/22/2015 FINAL  Final  Culture, expectorated sputum-assessment     Status: None   Collection Time: 10/18/15  1:15 PM  Result Value Ref Range Status   Specimen Description SPUTUM  Final   Special  Requests NONE  Final   Sputum evaluation THIS SPECIMEN IS ACCEPTABLE FOR SPUTUM CULTURE  Final   Report Status 10/18/2015 FINAL  Final  Culture, respiratory (NON-Expectorated)     Status: None   Collection Time: 10/18/15  1:15 PM  Result Value Ref Range Status   Specimen Description SPUTUM  Final   Special Requests NONE  Reflexed from [id number]  Final   Gram Stain   Final    GOOD SPECIMEN - 80-90% WBCS MODERATE WBC SEEN MANY GRAM NEGATIVE RODS    Culture   Final    HEAVY GROWTH PSEUDOMONAS AERUGINOSA MODERATE GROWTH KLEBSIELLA PNEUMONIAE CRITICAL RESULT CALLED TO, READ BACK BY AND VERIFIED WITHRod Mae AT 1610 10/21/15 DV KLEBSIELLA PNEUMONIAE This organism isolate is resistant to one or more antiotic agents in three or more antimicrobial categories.  Suggest Infectious Disease  consult.      Report Status 10/22/2015 FINAL  Final   Organism ID, Bacteria PSEUDOMONAS AERUGINOSA  Final   Organism ID, Bacteria KLEBSIELLA PNEUMONIAE  Final      Susceptibility   Klebsiella pneumoniae - MIC*    AMPICILLIN >=32 RESISTANT Resistant     CEFAZOLIN >=64 RESISTANT Resistant     CEFTRIAXONE >=64 RESISTANT Resistant     CIPROFLOXACIN >=4 RESISTANT Resistant     GENTAMICIN >=16 RESISTANT Resistant     IMIPENEM >=16 RESISTANT Resistant     NITROFURANTOIN >=512 RESISTANT Resistant     TRIMETH/SULFA <=20 SENSITIVE Sensitive     * MODERATE GROWTH KLEBSIELLA PNEUMONIAE   Pseudomonas aeruginosa - MIC*    CEFTAZIDIME 4 SENSITIVE Sensitive     CIPROFLOXACIN >=4 RESISTANT Resistant     GENTAMICIN 8 INTERMEDIATE Intermediate     IMIPENEM >=16 RESISTANT Resistant     PIP/TAZO Value in next row Sensitive      SENSITIVE32    * HEAVY GROWTH PSEUDOMONAS AERUGINOSA  C difficile quick scan w PCR reflex     Status: None   Collection Time: 10/18/15  4:39 PM  Result Value Ref Range Status   C Diff antigen NEGATIVE NEGATIVE Final   C Diff toxin NEGATIVE NEGATIVE Final   C Diff interpretation Negative for  C. difficile  Final  Culture, blood (Routine X 2) w Reflex to ID Panel     Status: None   Collection Time: 11/06/15  8:27 AM  Result Value Ref Range Status   Specimen Description BLOOD RIGHT HAND  Final   Special Requests BOTTLES DRAWN AEROBIC AND ANAEROBIC 3CC  Final   Culture NO GROWTH 5 DAYS  Final   Report Status 11/11/2015 FINAL  Final  Culture, blood (Routine X 2) w Reflex to ID Panel     Status: None   Collection Time: 11/06/15  8:29 AM  Result Value Ref Range Status   Specimen Description BLOOD LEFT HAND  Final   Special Requests BOTTLES DRAWN AEROBIC AND ANAEROBIC  1CC  Final   Culture NO GROWTH 5 DAYS  Final   Report Status 11/11/2015 FINAL  Final  Culture, expectorated sputum-assessment     Status: None   Collection Time: 11/06/15  8:41 AM  Result Value Ref Range Status   Specimen Description EXPECTORATED SPUTUM  Final   Special Requests Immunocompromised  Final   Sputum evaluation THIS SPECIMEN IS ACCEPTABLE FOR SPUTUM CULTURE  Final   Report Status 11/06/2015 FINAL  Final  Culture, respiratory (NON-Expectorated)     Status: None   Collection Time: 11/06/15  8:41 AM  Result Value Ref Range Status   Specimen Description EXPECTORATED SPUTUM  Final   Special Requests Immunocompromised Reflexed from [id number]  Final   Gram Stain   Final    MANY WBC SEEN MANY GRAM NEGATIVE RODS EXCELLENT SPECIMEN - 90-100% WBCS    Culture   Final    MODERATE GROWTH KLEBSIELLA PNEUMONIAE MODERATE GROWTH PSEUDOMONAS AERUGINOSA CRITICAL RESULT CALLED  TO, READ BACK BY AND VERIFIED WITH: DR. FITZGERALD AT 1120 11/09/15 DV DR. FITZGERALD REQUESTED ORGANISM BE SENT OUT FOR TIGECYCLINE CEFT/AVIBACTAM AND COLISTIN Sent to [facility] for further susceptibility testing. 11/11/15 CTJ    Report Status 11/15/2015 FINAL  Final   Organism ID, Bacteria KLEBSIELLA PNEUMONIAE  Final   Organism ID, Bacteria PSEUDOMONAS AERUGINOSA  Final      Susceptibility   Klebsiella pneumoniae - MIC*    AMPICILLIN >=32  RESISTANT Resistant     CEFAZOLIN >=64 RESISTANT Resistant     CEFTRIAXONE 32 RESISTANT Resistant     CIPROFLOXACIN >=4 RESISTANT Resistant     GENTAMICIN >=16 RESISTANT Resistant     IMIPENEM >=16 RESISTANT Resistant     NITROFURANTOIN 256 RESISTANT Resistant     TRIMETH/SULFA >=320 RESISTANT Resistant     Extended ESBL POSITIVE Resistant     * MODERATE GROWTH KLEBSIELLA PNEUMONIAE   Pseudomonas aeruginosa - MIC*    CEFTAZIDIME >=64 RESISTANT Resistant     CIPROFLOXACIN 2 INTERMEDIATE Intermediate     GENTAMICIN >=16 RESISTANT Resistant     IMIPENEM >=16 RESISTANT Resistant     * MODERATE GROWTH PSEUDOMONAS AERUGINOSA  Culture, respiratory (NON-Expectorated)     Status: None   Collection Time: 11/14/15  2:07 PM  Result Value Ref Range Status   Specimen Description TRACHEAL ASPIRATE  Final   Special Requests NONE  Final   Gram Stain   Final    EXCELLENT SPECIMEN - 90-100% WBCS MANY WBC SEEN MODERATE GRAM NEGATIVE RODS    Culture   Final    HEAVY GROWTH PSEUDOMONAS AERUGINOSA CRITICAL VALUE NOTED.  VALUE IS CONSISTENT WITH PREVIOUSLY REPORTED AND CALLED VALUE.    Report Status 11/16/2015 FINAL  Final   Organism ID, Bacteria PSEUDOMONAS AERUGINOSA  Final      Susceptibility   Pseudomonas aeruginosa - MIC*    CEFTAZIDIME >=64 RESISTANT Resistant     CIPROFLOXACIN 2 INTERMEDIATE Intermediate     GENTAMICIN >=16 RESISTANT Resistant     IMIPENEM >=16 RESISTANT Resistant     PIP/TAZO Value in next row Resistant      RESISTANT>=128    TOBRAMYCIN Value in next row Sensitive      SENSITIVE2    * HEAVY GROWTH PSEUDOMONAS AERUGINOSA    Coagulation Studies: No results for input(s): LABPROT, INR in the last 72 hours.  Urinalysis: No results for input(s): COLORURINE, LABSPEC, PHURINE, GLUCOSEU, HGBUR, BILIRUBINUR, KETONESUR, PROTEINUR, UROBILINOGEN, NITRITE, LEUKOCYTESUR in the last 72 hours.  Invalid input(s): APPERANCEUR    Imaging: No results found.   Medications:   .  dextrose 25 mL/hr at 12/07/15 0700  . feeding supplement (VITAL AF 1.2 CAL) 1,000 mL (12/07/15 0700)  . norepinephrine (LEVOPHED) Adult infusion 15.04 mcg/min (12/07/15 0700)   . albumin human  12.5 g Intravenous Once  . albuterol  2.5 mg Nebulization Q6H  . amiodarone  100 mg Per Tube Daily  . antiseptic oral rinse  7 mL Mouth Rinse BID  . antiseptic oral rinse  7 mL Mouth Rinse QID  . aspirin  81 mg Per Tube Daily  . chlorhexidine gluconate  15 mL Mouth Rinse BID  . [START ON 12/08/2015] colistimethate (COLISTIN) IV  250 mg Intravenous Q48H  . epoetin (EPOGEN/PROCRIT) injection  10,000 Units Intravenous Q M,W,F-HD  . escitalopram  10 mg Per Tube Daily  . feeding supplement (PRO-STAT SUGAR FREE 64)  30 mL Per Tube 6 X Daily  . free water  20 mL Per Tube 6 times  per day  . hydrocortisone  10 mg Per Tube BID  . lidocaine  1 patch Transdermal Q24H  . midodrine  10 mg Per Tube TID WC  . mirtazapine  7.5 mg Per Tube QHS  . multivitamin  5 mL Per Tube Daily  . pantoprazole sodium  40 mg Per Tube Daily  . sodium chloride  10-40 mL Intracatheter Q12H  . sodium hypochlorite   Irrigation BID  . tigecycline (TYGACIL) IVPB  50 mg Intravenous Q12H  . tobramycin (PF)  300 mg Nebulization BID   acetaminophen (TYLENOL) oral liquid 160 mg/5 mL, albuterol, HYDROmorphone (DILAUDID) injection, loperamide, ondansetron (ZOFRAN) IV, oxyCODONE, polyvinyl alcohol, sodium chloride, zinc oxide  Assessment/ Plan:  50 y.o. 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 Admitted on 28-Aug-2015  Echo: 11/07/19: EF of 30% to 35%.   1. End-stage renal disease on hemodialysis on HD MWF. The patient has been on dialysis since October of 2014. R IJ permcath.  - Proceed with dialysis today using low blood flow rate, conservative ultrafiltration target, and albumin as needed.  2. Anemia of CKD. Hemoglobin up to 8.2 today. Continue to monitor CBC periodically. Continue Epogen at its current dosage..  3. Acute resp failure: ventilator dependent -Patient unlikely with minimal from the ventilator at this time. Continue vent support.   4. Anasarca - Generalized edema persists. Ultrafiltration target 1-1.5 kg as she does not tolerate more than this.  5. Other:  - Sepsis:  Multi drug resistant organism - large st 4 decubitus - poor nutrition - not a candidate for outpatient dialysis.         LOS: [address] Feleica Fulmore 1/18/20179:23 AM

## 2015-12-07 NOTE — Plan of Care (Signed)
Problem: Phase I Progression Outcomes Goal: Patient tolerating weaning plan Outcome: Not Progressing Pt still on vent having episodes each shift of guppy breathing and accessory muscle use.  Goal: Optimized method of communication. Outcome: Not Progressing Pt still lethargic and unable to communicate needs appropriately.

## 2015-12-07 NOTE — Progress Notes (Signed)
Post HD Tx Assessment  

## 2015-12-07 NOTE — Progress Notes (Signed)
Post HD TX Termination

## 2015-12-07 NOTE — Consult Note (Signed)
WOC wound consult note Wound type:Chronic Stage IV pressure ulcer Ongoing WOC evals.  Ischemic injury to bilateral fifth metatarsal, necrotic.  Left foot posterior tibial pulse detected with doppler, no dorsalis pedis heard today.  Bilateral deep tissue injury to heels, noted on admission, have opened to full thickness injury.  Measurement: sacrum 20 cm x 15 cm x 4 cm with palpable bone in wound bed. 80 pink tissue, 10% adherent slough and 10% muscle/tendon. Wound has decreased adherent yellow slough in wound bed. Undermining still present from 8 to 12 o'clock, extending 1 cm and from 2 to 6 o'clock, extending 2 cm Right lateral foot 100% slough with detaching eschar. . Right trochanter 3.6 cm x 1 cm x 0.1 cm 10% devitalized tissue 90% pale pink tissue.  MARSI (Medical Adhesive Related Skin Injury present to abdomen)  Silicone foam in place  Patient continues to have increasing loose stools.  Drainage (amount, consistency, odor) Moderate serosanguinous drainage. No odor  Periwound:Intact Dressing procedure/placement/frequency:Cleanse abdominal wound,  right foot, bilateral fifth metatarsals with NS and apply silicone foam dressing.Change every 3 days.  Cleanse right hip with NS and pat gently dry. Apply Allevyn silicone border foam. Change every 3 days and PRn soilage Cleanse sacral ulcer with NS and pat gently dry. Gently fill wound bed with Dakin's moistened gauze. Cover with 4x4 gauze and ABD pad. Change each shift and PRN soilage.  Will not follow at this time. Please re-consult if needed.   Maple Hudson RN BSN CWON Pager [phone]

## 2015-12-07 NOTE — Progress Notes (Signed)
Pre HD Tx Machine & Patient Checks 

## 2015-12-08 LAB — RENAL FUNCTION PANEL
Albumin: 1.6 g/dL — ABNORMAL LOW (ref 3.5–5.0)
Anion gap: 6 (ref 5–15)
BUN: 90 mg/dL — AB (ref 6–20)
CHLORIDE: 91 mmol/L — AB (ref 101–111)
CO2: 29 mmol/L (ref 22–32)
CREATININE: 1.27 mg/dL — AB (ref 0.44–1.00)
Calcium: 7.6 mg/dL — ABNORMAL LOW (ref 8.9–10.3)
GFR calc non Af Amer: 48 mL/min — ABNORMAL LOW (ref >60)
GFR, EST AFRICAN AMERICAN: 56 mL/min — AB (ref >60)
Glucose, Bld: 406 mg/dL — ABNORMAL HIGH (ref 65–99)
Phosphorus: 4.8 mg/dL — ABNORMAL HIGH (ref 2.5–4.6)
Potassium: 3.7 mmol/L (ref 3.5–5.1)
Sodium: 126 mmol/L — ABNORMAL LOW (ref 135–145)

## 2015-12-08 LAB — GLUCOSE, CAPILLARY
GLUCOSE-CAPILLARY: 132 mg/dL — AB (ref 65–99)
Glucose-Capillary: 139 mg/dL — ABNORMAL HIGH (ref 65–99)
Glucose-Capillary: 173 mg/dL — ABNORMAL HIGH (ref 65–99)

## 2015-12-08 LAB — HEPATITIS B SURFACE ANTIGEN: HEP B S AG: NEGATIVE

## 2015-12-08 MED ORDER — HEPARIN SODIUM (PORCINE) 1000 UNIT/ML DIALYSIS
1000.0000 [IU] | INTRAMUSCULAR | Status: DC | PRN
  Filled 2015-12-08: qty 6

## 2015-12-08 MED ORDER — PUREFLOW DIALYSIS SOLUTION
INTRAVENOUS | Status: DC
  Administered 2015-12-08: 3 via INTRAVENOUS_CENTRAL

## 2015-12-08 NOTE — Progress Notes (Signed)
ANTIBIOTIC CONSULT NOTE - follow Up  Pharmacy Consult for Colistin and Tigecycline  Indication: MDR Respiratory Infection   Allergies  Allergen Reactions  . Contrast Media [Iodinated Diagnostic Agents] Anaphylaxis  . Ampicillin Rash   Patient Measurements: Height:  (SCALE BROKE) Weight:  (No bedscale) IBW/kg (Calculated) : 66.2  Vital Signs: Temp: 98.2 F (36.8 C) (01/19 0700) BP: 114/41 mmHg (01/19 0700) Pulse Rate: 82 (01/19 0700) Intake/Output from previous day: 01/18 0701 - 01/19 0700 In: 1304.3 [I.V.:994.3; NG/GT:210; IV Piggyback:100] Out: 1000  Intake/Output from this shift: Total I/O In: 10 [I.V.:10] Out: -   Recent Labs  12/07/15 0433  WBC 18.6*  HGB 8.2*  PLT 59*   Estimated Creatinine Clearance: 62.9 mL/min (by C-G formula based on Cr of 1.36). No results for input(s): VANCOTROUGH, VANCOPEAK, VANCORANDOM, GENTTROUGH, GENTPEAK, GENTRANDOM, TOBRATROUGH, TOBRAPEAK, TOBRARND, AMIKACINPEAK, AMIKACINTROU, AMIKACIN in the last 72 hours.   Microbiology: Recent Results (from the past 720 hour(s))  Culture, respiratory (NON-Expectorated)     Status: None   Collection Time: 11/14/15  2:07 PM  Result Value Ref Range Status   Specimen Description TRACHEAL ASPIRATE  Final   Special Requests NONE  Final   Gram Stain   Final    EXCELLENT SPECIMEN - 90-100% WBCS MANY WBC SEEN MODERATE GRAM NEGATIVE RODS    Culture   Final    HEAVY GROWTH PSEUDOMONAS AERUGINOSA CRITICAL VALUE NOTED.  VALUE IS CONSISTENT WITH PREVIOUSLY REPORTED AND CALLED VALUE.    Report Status 11/16/2015 FINAL  Final   Organism ID, Bacteria PSEUDOMONAS AERUGINOSA  Final      Susceptibility   Pseudomonas aeruginosa - MIC*    CEFTAZIDIME >=64 RESISTANT Resistant     CIPROFLOXACIN 2 INTERMEDIATE Intermediate     GENTAMICIN >=16 RESISTANT Resistant     IMIPENEM >=16 RESISTANT Resistant     PIP/TAZO Value in next row Resistant      RESISTANT>=128    TOBRAMYCIN Value in next row Sensitive    SENSITIVE2    * HEAVY GROWTH PSEUDOMONAS AERUGINOSA    Medical History: Past Medical History  Diagnosis Date  . Obesity   . Dyslipidemia   . Hypertension   . Coronary artery disease     s/p BMS 2010 LAD  . Dysrhythmia     ventricular tachycardia resolved after LAD stent and beta blocker  . Diabetes mellitus     with retinopathy, neuropathy and microalbuminemia  . 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 Patient is on day 119 of hospital stay, and has been treated for multiple infections during this time. Patient transitioning back to CRRT from HD.   Plan:  Per microbiology, Klebsiella is sensitive to Tygacil. Sensitivity results for Avycaz and colistin are still pending.   Continue tigecycline  IV q12h.   Continue  colistimethate  (~2.5 mg/kg) IV q48 hours.   Patient should be monitored for nephrotoxicity and neurotoxicity.  Pharmacy to continue to follow per consult.      Luisa Hart, PharmD Clinical Pharmacist   12/08/2015 12:13 PM

## 2015-12-08 NOTE — Progress Notes (Signed)
Subjective:  Patient remains critically ill. Still has quite significant edema. FiO2 stable on the vent at 35%. Care plan discussed with nursing today. We are currently considering going back on continuous renal replacement therapy given massive volume overload.  Objective:  Vital signs in last 24 hours:  Temp:  [93.7 F (34.3 C)-98.8 F (37.1 C)] 98.2 F (36.8 C) (01/19 0700) Pulse Rate:  [37-98] 82 (01/19 0700) Resp:  [0-21] 14 (01/19 0700) BP: (80-130)/(30-80) 114/41 mmHg (01/19 0700) SpO2:  [94 %-100 %] 99 % (01/19 0700) FiO2 (%):  [35 %] 35 % (01/19 0836)  Weight change:  Filed Weights    Intake/Output: I/O last 3 completed shifts: In: 2226.6 [I.V.:1437.6; NG/GT:589; IV Piggyback:200] Out: 1000 [Other:1000]   Intake/Output this shift:     Physical Exam: General: critically ill   Head/ENT: Moist oral mucosal membrane, +NG tube  Eyes: Anicteric, eyes open  Neck: Tracheostomy in place  Lungs:  vent assisted , diffuse crackles  Heart: S1S2 no rubs  Abdomen:  Soft, NT, BS present  Extremities: Anasarca, 3+ in LE's  Neurologic: Eyes open but not following commands   Access:  R IJ permcath.       Basic Metabolic Panel:  Recent Labs Lab 12/02/15 1609 12/05/15 0538  NA 132* 131*  K 3.7 3.9  CL 96* 96*  CO2 29 29  GLUCOSE 186* 124*  BUN 95* 106*  CREATININE 1.34* 1.36*  CALCIUM 8.2* 8.0*  MG  --  2.0  PHOS 5.6* 5.4*    Liver Function Tests:  Recent Labs Lab 12/02/15 1609 12/05/15 0538  AST  --  43*  ALT  --  34  ALKPHOS  --  2594*  BILITOT  --  1.2  PROT  --  4.9*  ALBUMIN 1.6* 1.7*   No results for input(s): LIPASE, AMYLASE in the last 168 hours. No results for input(s): AMMONIA in the last 168 hours.  CBC:  Recent Labs Lab 12/02/15 1609 12/03/15 0552 12/05/15 0538 12/07/15 0433  WBC 13.5* 15.5* 15.1* 18.6*  NEUTROABS  --   --  12.7*  --   HGB 8.8* 8.5* 7.2* 8.2*  HCT 30.4* 28.0* 23.7* 27.5*  MCV 98.8 98.0 98.7 101.0*  PLT 58*  58* 62* 59*    Cardiac Enzymes: No results for input(s): CKTOTAL, CKMB, CKMBINDEX, TROPONINI in the last 168 hours.  BNP: Invalid input(s): POCBNP  CBG:  Recent Labs Lab 12/07/15 1140 12/07/15 1629 12/07/15 2354 12/07/15 2358 12/08/15 0751  GLUCAP 142* 130* 137* 173* 132*    Microbiology: Results for orders placed or performed during the hospital encounter of 2015/09/08  Blood Culture (routine x 2)     Status: None   Collection Time: 2015/09/08  8:51 AM  Result Value Ref Range Status   Specimen Description BLOOD [name]  Final   Special Requests BOTTLES DRAWN AEROBIC AND ANAEROBIC  3CC  Final   Culture NO GROWTH 5 DAYS  Final   Report Status 08/17/2015 FINAL  Final  Blood Culture (routine x 2)     Status: None   Collection Time: 09/08/15  9:20 AM  Result Value Ref Range Status   Specimen Description BLOOD LEFT ARM  Final   Special Requests BOTTLES DRAWN AEROBIC AND ANAEROBIC  1CC  Final   Culture NO GROWTH 5 DAYS  Final   Report Status 08/17/2015 FINAL  Final  Wound culture     Status: None   Collection Time: 09-08-2015  9:20 AM  Result Value Ref Range Status  Specimen Description DECUBITIS  Final   Special Requests Normal  Final   Gram Stain   Final    FEW WBC SEEN MANY GRAM NEGATIVE RODS RARE GRAM POSITIVE COCCI    Culture   Final    HEAVY GROWTH ESCHERICHIA COLI MODERATE GROWTH ENTEROBACTER AEROGENES PROTEUS MIRABILIS HEAVY GROWTH ENTEROCOCCUS SPECIES VRE HAVE INTRINSIC RESISTANCE TO MOST COMMONLY USED ANTIBIOTICS AND THE ABILITY TO ACQUIRE RESISTANCE TO MOST AVAILABLE ANTIBIOTICS.    Report Status 08/16/2015 FINAL  Final   Organism ID, Bacteria ESCHERICHIA COLI  Final   Organism ID, Bacteria ENTEROBACTER AEROGENES  Final   Organism ID, Bacteria PROTEUS MIRABILIS  Final   Organism ID, Bacteria ENTEROCOCCUS SPECIES  Final      Susceptibility   Enterobacter aerogenes - MIC*    CEFTAZIDIME <=1 SENSITIVE Sensitive     CEFAZOLIN >=64 RESISTANT Resistant      CEFTRIAXONE <=1 SENSITIVE Sensitive     CIPROFLOXACIN <=0.25 SENSITIVE Sensitive     GENTAMICIN <=1 SENSITIVE Sensitive     IMIPENEM 1 SENSITIVE Sensitive     TRIMETH/SULFA <=20 SENSITIVE Sensitive     * MODERATE GROWTH ENTEROBACTER AEROGENES   Escherichia coli - MIC*    AMPICILLIN <=2 SENSITIVE Sensitive     CEFTAZIDIME <=1 SENSITIVE Sensitive     CEFAZOLIN <=4 SENSITIVE Sensitive     CEFTRIAXONE <=1 SENSITIVE Sensitive     CIPROFLOXACIN <=0.25 SENSITIVE Sensitive     GENTAMICIN <=1 SENSITIVE Sensitive     IMIPENEM <=0.25 SENSITIVE Sensitive     TRIMETH/SULFA <=20 SENSITIVE Sensitive     * HEAVY GROWTH ESCHERICHIA COLI   Proteus mirabilis - MIC*    AMPICILLIN >=32 RESISTANT Resistant     CEFTAZIDIME <=1 SENSITIVE Sensitive     CEFAZOLIN 8 SENSITIVE Sensitive     CEFTRIAXONE <=1 SENSITIVE Sensitive     CIPROFLOXACIN <=0.25 SENSITIVE Sensitive     GENTAMICIN <=1 SENSITIVE Sensitive     IMIPENEM 1 SENSITIVE Sensitive     TRIMETH/SULFA <=20 SENSITIVE Sensitive     * PROTEUS MIRABILIS   Enterococcus species - MIC*    AMPICILLIN >=32 RESISTANT Resistant     VANCOMYCIN >=32 RESISTANT Resistant     GENTAMICIN SYNERGY SENSITIVE Sensitive     TETRACYCLINE Value in next row Resistant      RESISTANT>=16    * HEAVY GROWTH ENTEROCOCCUS SPECIES  MRSA PCR Screening     Status: None   Collection Time: 02-Sep-2015  2:38 PM  Result Value Ref Range Status   MRSA by PCR NEGATIVE NEGATIVE Final    Comment:        The GeneXpert MRSA Assay (FDA approved for NASAL specimens only), is one component of a comprehensive MRSA colonization surveillance program. It is not intended to diagnose MRSA infection nor to guide or monitor treatment for MRSA infections.   Blood culture (single)     Status: None   Collection Time: 09/02/2015  3:36 PM  Result Value Ref Range Status   Specimen Description BLOOD RIGHT ASSIST CONTROL  Final   Special Requests BOTTLES DRAWN AEROBIC AND ANAEROBIC  Final    Culture NO GROWTH 5 DAYS  Final   Report Status 08/17/2015 FINAL  Final  Wound culture     Status: None   Collection Time: 08/13/15 12:37 PM  Result Value Ref Range Status   Specimen Description WOUND  Final   Special Requests Normal  Final   Gram Stain   Final    FEW WBC SEEN TOO  NUMEROUS TO COUNT GRAM NEGATIVE RODS FEW GRAM POSITIVE COCCI    Culture   Final    HEAVY GROWTH ESCHERICHIA COLI MODERATE GROWTH PROTEUS MIRABILIS LIGHT GROWTH KLEBSIELLA PNEUMONIAE MODERATE GROWTH ENTEROCOCCUS GALLINARUM CRITICAL RESULT CALLED TO, READ BACK BY AND VERIFIED WITH: [facility] BORBA AT 1042 08/16/15 DV    Report Status 08/17/2015 FINAL  Final   Organism ID, Bacteria ESCHERICHIA COLI  Final   Organism ID, Bacteria PROTEUS MIRABILIS  Final   Organism ID, Bacteria KLEBSIELLA PNEUMONIAE  Final   Organism ID, Bacteria ENTEROCOCCUS GALLINARUM  Final      Susceptibility   Escherichia coli - MIC*    AMPICILLIN >=32 RESISTANT Resistant     CEFTAZIDIME 4 RESISTANT Resistant     CEFAZOLIN >=64 RESISTANT Resistant     CEFTRIAXONE 16 RESISTANT Resistant     GENTAMICIN 2 SENSITIVE Sensitive     IMIPENEM >=16 RESISTANT Resistant     TRIMETH/SULFA <=20 SENSITIVE Sensitive     Extended ESBL POSITIVE Resistant     PIP/TAZO Value in next row Resistant      RESISTANT>=128    CIPROFLOXACIN Value in next row Sensitive      SENSITIVE<=0.25    * HEAVY GROWTH ESCHERICHIA COLI   Klebsiella pneumoniae - MIC*    AMPICILLIN Value in next row Resistant      SENSITIVE<=0.25    CEFTAZIDIME Value in next row Resistant      SENSITIVE<=0.25    CEFAZOLIN Value in next row Resistant      SENSITIVE<=0.25    CEFTRIAXONE Value in next row Resistant      SENSITIVE<=0.25    CIPROFLOXACIN Value in next row Resistant      SENSITIVE<=0.25    GENTAMICIN Value in next row Sensitive      SENSITIVE<=0.25    IMIPENEM Value in next row Resistant      SENSITIVE<=0.25    TRIMETH/SULFA Value in next row Resistant       SENSITIVE<=0.25    PIP/TAZO Value in next row Resistant      RESISTANT>=128    * LIGHT GROWTH KLEBSIELLA PNEUMONIAE   Proteus mirabilis - MIC*    AMPICILLIN Value in next row Resistant      RESISTANT>=128    CEFTAZIDIME Value in next row Sensitive      RESISTANT>=128    CEFAZOLIN Value in next row Sensitive      RESISTANT>=128    CEFTRIAXONE Value in next row Sensitive      RESISTANT>=128    CIPROFLOXACIN Value in next row Sensitive      RESISTANT>=128    GENTAMICIN Value in next row Sensitive      RESISTANT>=128    IMIPENEM Value in next row Sensitive      RESISTANT>=128    TRIMETH/SULFA Value in next row Sensitive      RESISTANT>=128    PIP/TAZO Value in next row Sensitive      SENSITIVE<=4    * MODERATE GROWTH PROTEUS MIRABILIS   Enterococcus gallinarum - MIC*    AMPICILLIN Value in next row Resistant      SENSITIVE<=4    GENTAMICIN SYNERGY Value in next row Sensitive      SENSITIVE<=4    CIPROFLOXACIN Value in next row Resistant      RESISTANT>=8    TETRACYCLINE Value in next row Resistant      RESISTANT>=16    * MODERATE GROWTH ENTEROCOCCUS GALLINARUM  Wound culture     Status: None   Collection Time: 08/15/15  2:53 PM  Result Value Ref Range Status   Specimen Description WOUND  Final   Special Requests NONE  Final   Gram Stain FEW WBC SEEN NO ORGANISMS SEEN   Final   Culture NO GROWTH 3 DAYS  Final   Report Status 08/18/2015 FINAL  Final  C difficile quick scan w PCR reflex     Status: None   Collection Time: 08/17/15 11:34 AM  Result Value Ref Range Status   C Diff antigen NEGATIVE NEGATIVE Final   C Diff toxin NEGATIVE NEGATIVE Final   C Diff interpretation Negative for C. difficile  Final  Stool culture     Status: None   Collection Time: 09/03/15  3:51 PM  Result Value Ref Range Status   Specimen Description STOOL  Final   Special Requests Immunocompromised  Final   Culture   Final    NO SALMONELLA OR SHIGELLA ISOLATED No Pathogenic E. coli  detected NO CAMPYLOBACTER DETECTED    Report Status 09/07/2015 FINAL  Final  C difficile quick scan w PCR reflex     Status: None   Collection Time: 09/13/15 12:51 AM  Result Value Ref Range Status   C Diff antigen NEGATIVE NEGATIVE Final   C Diff toxin NEGATIVE NEGATIVE Final   C Diff interpretation Negative for C. difficile  Final  Culture, blood (routine x 2)     Status: None   Collection Time: 09/16/15 11:24 AM  Result Value Ref Range Status   Specimen Description BLOOD LEFT HAND  Final   Special Requests BOTTLES DRAWN AEROBIC AND ANAEROBIC  1CC  Final   Culture NO GROWTH 5 DAYS  Final   Report Status 09/21/2015 FINAL  Final  Culture, blood (routine x 2)     Status: None   Collection Time: 09/16/15 12:23 PM  Result Value Ref Range Status   Specimen Description BLOOD RIGHT HAND  Final   Special Requests BOTTLES DRAWN AEROBIC AND ANAEROBIC  1CC  Final   Culture  Setup Time   Final    GRAM POSITIVE COCCI AEROBIC BOTTLE ONLY CRITICAL RESULT CALLED TO, READ BACK BY AND VERIFIED WITH: TESS THOMAS,RN 09/17/2015 0631 BY JRS.    Culture   Final    ENTEROCOCCUS FAECALIS AEROBIC BOTTLE ONLY VRE HAVE INTRINSIC RESISTANCE TO MOST COMMONLY USED ANTIBIOTICS AND THE ABILITY TO ACQUIRE RESISTANCE TO MOST AVAILABLE ANTIBIOTICS. CRITICAL RESULT CALLED TO, READ BACK BY AND VERIFIED WITH: CHERYL SMITH AT 1610 09/19/15 DV    Report Status 09/21/2015 FINAL  Final   Organism ID, Bacteria ENTEROCOCCUS FAECALIS  Final      Susceptibility   Enterococcus faecalis - MIC*    AMPICILLIN <=2 SENSITIVE Sensitive     LINEZOLID 2 SENSITIVE Sensitive     CIPROFLOXACIN Value in next row Resistant      RESISTANT>=8    TETRACYCLINE Value in next row Resistant      RESISTANT>=16    VANCOMYCIN Value in next row Resistant      RESISTANT>=32    GENTAMICIN SYNERGY Value in next row Resistant      RESISTANT>=32    * ENTEROCOCCUS FAECALIS  Culture, respiratory (NON-Expectorated)     Status: None   Collection  Time: 09/16/15  3:50 PM  Result Value Ref Range Status   Specimen Description TRACHEAL ASPIRATE  Final   Special Requests Immunocompromised  Final   Gram Stain   Final    FEW WBC SEEN GOOD SPECIMEN - 80-90% WBCS RARE GRAM NEGATIVE RODS  Culture LIGHT GROWTH PSEUDOMONAS AERUGINOSA  Final   Report Status 09/19/2015 FINAL  Final   Organism ID, Bacteria PSEUDOMONAS AERUGINOSA  Final      Susceptibility   Pseudomonas aeruginosa - MIC*    CEFTAZIDIME 8 SENSITIVE Sensitive     CIPROFLOXACIN 2 INTERMEDIATE Intermediate     GENTAMICIN >=16 RESISTANT Resistant     IMIPENEM >=16 RESISTANT Resistant     PIP/TAZO Value in next row Sensitive      SENSITIVE32    CEFEPIME Value in next row Sensitive      SENSITIVE8    LEVOFLOXACIN Value in next row Resistant      RESISTANT>=8    * LIGHT GROWTH PSEUDOMONAS AERUGINOSA  Culture, expectorated sputum-assessment     Status: None   Collection Time: 09/22/15  2:09 PM  Result Value Ref Range Status   Specimen Description ENDOTRACHEAL  Final   Special Requests Normal  Final   Sputum evaluation THIS SPECIMEN IS ACCEPTABLE FOR SPUTUM CULTURE  Final   Report Status 09/24/2015 FINAL  Final  Culture, respiratory (NON-Expectorated)     Status: None   Collection Time: 09/22/15  2:09 PM  Result Value Ref Range Status   Specimen Description ENDOTRACHEAL  Final   Special Requests Normal Reflexed from [id number]  Final   Gram Stain   Final    FEW WBC SEEN FEW GRAM NEGATIVE RODS POOR SPECIMEN - LESS THAN 70% WBCS    Culture   Final    MODERATE GROWTH PSEUDOMONAS AERUGINOSA LIGHT GROWTH KLEBSIELLA PNEUMONIAE REFER TO SENSITIVITIES FROM PREVIOUS CULTURE FOR ORG 2    Report Status 10/01/2015 FINAL  Final   Organism ID, Bacteria PSEUDOMONAS AERUGINOSA  Final   Organism ID, Bacteria KLEBSIELLA PNEUMONIAE  Final      Susceptibility   Pseudomonas aeruginosa - MIC*    CEFTAZIDIME 4 SENSITIVE Sensitive     CIPROFLOXACIN 2 INTERMEDIATE Intermediate      GENTAMICIN >=16 RESISTANT Resistant     IMIPENEM >=16 RESISTANT Resistant     PIP/TAZO Value in next row Sensitive      SENSITIVE16    * MODERATE GROWTH PSEUDOMONAS AERUGINOSA  Tissue culture     Status: None   Collection Time: 09/24/15  6:44 AM  Result Value Ref Range Status   Specimen Description BONE  Final   Special Requests Normal  Final   Gram Stain MODERATE WBC SEEN FEW GRAM NEGATIVE RODS   Final   Culture   Final    MODERATE GROWTH ESCHERICHIA COLI MODERATE GROWTH KLEBSIELLA PNEUMONIAE ESBL-EXTENDED SPECTRUM BETA LACTAMASE-THE ORGANISM IS RESISTANT TO PENICILLINS, CEPHALOSPORINS AND AZTREONAM ACCORDING TO CLSI M100-S15 VOL.25 N01 JAN 2005. ORGANISM 1 This organism isolate is resistant to one or more antiotic agents in three or more antimicrobial categories.  Suggest Infectious Disease consult.   ORGANISM 2 CRITICAL RESULT CALLED TO, READ BACK BY AND VERIFIED WITH: RN Mardene Celeste LINDSAY 09/27/15 1005AM    Report Status 09/28/2015 FINAL  Final   Organism ID, Bacteria ESCHERICHIA COLI  Final   Organism ID, Bacteria KLEBSIELLA PNEUMONIAE  Final      Susceptibility   Escherichia coli - MIC*    AMPICILLIN >=32 RESISTANT Resistant     CEFTAZIDIME 4 RESISTANT Resistant     CEFAZOLIN >=64 RESISTANT Resistant     CEFTRIAXONE 8 RESISTANT Resistant     GENTAMICIN <=1 SENSITIVE Sensitive     IMIPENEM 8 RESISTANT Resistant     TRIMETH/SULFA <=20 SENSITIVE Sensitive     Extended ESBL POSITIVE Resistant  PIP/TAZO Value in next row Resistant      RESISTANT>=128    * MODERATE GROWTH ESCHERICHIA COLI   Klebsiella pneumoniae - MIC*    AMPICILLIN Value in next row Resistant      RESISTANT>=128    CEFTAZIDIME Value in next row Resistant      RESISTANT>=128    CEFAZOLIN Value in next row Resistant      RESISTANT>=128    CEFTRIAXONE Value in next row Resistant      RESISTANT>=128    CIPROFLOXACIN Value in next row Resistant      RESISTANT>=128    GENTAMICIN Value in next row Resistant       RESISTANT>=128    IMIPENEM Value in next row Resistant      RESISTANT>=128    TRIMETH/SULFA Value in next row Sensitive      RESISTANT>=128    PIP/TAZO Value in next row Resistant      RESISTANT>=128    * MODERATE GROWTH KLEBSIELLA PNEUMONIAE  Anaerobic culture     Status: None   Collection Time: 09/24/15  3:55 PM  Result Value Ref Range Status   Specimen Description BONE  Final   Special Requests Normal  Final   Culture NO ANAEROBES ISOLATED  Final   Report Status 09/29/2015 FINAL  Final  Culture, fungus without smear     Status: None   Collection Time: 09/24/15  3:55 PM  Result Value Ref Range Status   Specimen Description BONE  Final   Special Requests Normal  Final   Culture NO FUNGUS ISOLATED AFTER 24 DAYS  Final   Report Status 10/18/2015 FINAL  Final  C difficile quick scan w PCR reflex     Status: None   Collection Time: 10/07/15  2:02 PM  Result Value Ref Range Status   C Diff antigen NEGATIVE NEGATIVE Final   C Diff toxin NEGATIVE NEGATIVE Final   C Diff interpretation Negative for C. difficile  Final  Culture, blood (routine x 2)     Status: None   Collection Time: 10/16/15  9:52 AM  Result Value Ref Range Status   Specimen Description BLOOD LEFT HAND  Final   Special Requests   Final    BOTTLES DRAWN AEROBIC AND ANAEROBIC  AER 6CC ANA 4CC   Culture NO GROWTH 6 DAYS  Final   Report Status 10/22/2015 FINAL  Final  Culture, blood (routine x 2)     Status: None   Collection Time: 10/16/15 12:25 PM  Result Value Ref Range Status   Specimen Description BLOOD LEFT HAND  Final   Special Requests BOTTLES DRAWN AEROBIC AND ANAEROBIC  5CC  Final   Culture NO GROWTH 6 DAYS  Final   Report Status 10/22/2015 FINAL  Final  Culture, expectorated sputum-assessment     Status: None   Collection Time: 10/18/15  1:15 PM  Result Value Ref Range Status   Specimen Description SPUTUM  Final   Special Requests NONE  Final   Sputum evaluation THIS SPECIMEN IS ACCEPTABLE FOR  SPUTUM CULTURE  Final   Report Status 10/18/2015 FINAL  Final  Culture, respiratory (NON-Expectorated)     Status: None   Collection Time: 10/18/15  1:15 PM  Result Value Ref Range Status   Specimen Description SPUTUM  Final   Special Requests NONE Reflexed from [id number]  Final   Gram Stain   Final    GOOD SPECIMEN - 80-90% WBCS MODERATE WBC SEEN MANY GRAM NEGATIVE RODS    Culture  Final    HEAVY GROWTH PSEUDOMONAS AERUGINOSA MODERATE GROWTH KLEBSIELLA PNEUMONIAE CRITICAL RESULT CALLED TO, READ BACK BY AND VERIFIED WITHRod Mae AT 1610 10/21/15 DV KLEBSIELLA PNEUMONIAE This organism isolate is resistant to one or more antiotic agents in three or more antimicrobial categories.  Suggest Infectious Disease  consult.      Report Status 10/22/2015 FINAL  Final   Organism ID, Bacteria PSEUDOMONAS AERUGINOSA  Final   Organism ID, Bacteria KLEBSIELLA PNEUMONIAE  Final      Susceptibility   Klebsiella pneumoniae - MIC*    AMPICILLIN >=32 RESISTANT Resistant     CEFAZOLIN >=64 RESISTANT Resistant     CEFTRIAXONE >=64 RESISTANT Resistant     CIPROFLOXACIN >=4 RESISTANT Resistant     GENTAMICIN >=16 RESISTANT Resistant     IMIPENEM >=16 RESISTANT Resistant     NITROFURANTOIN >=512 RESISTANT Resistant     TRIMETH/SULFA <=20 SENSITIVE Sensitive     * MODERATE GROWTH KLEBSIELLA PNEUMONIAE   Pseudomonas aeruginosa - MIC*    CEFTAZIDIME 4 SENSITIVE Sensitive     CIPROFLOXACIN >=4 RESISTANT Resistant     GENTAMICIN 8 INTERMEDIATE Intermediate     IMIPENEM >=16 RESISTANT Resistant     PIP/TAZO Value in next row Sensitive      SENSITIVE32    * HEAVY GROWTH PSEUDOMONAS AERUGINOSA  C difficile quick scan w PCR reflex     Status: None   Collection Time: 10/18/15  4:39 PM  Result Value Ref Range Status   C Diff antigen NEGATIVE NEGATIVE Final   C Diff toxin NEGATIVE NEGATIVE Final   C Diff interpretation Negative for C. difficile  Final  Culture, blood (Routine X 2) w Reflex to ID Panel      Status: None   Collection Time: 11/06/15  8:27 AM  Result Value Ref Range Status   Specimen Description BLOOD RIGHT HAND  Final   Special Requests BOTTLES DRAWN AEROBIC AND ANAEROBIC 3CC  Final   Culture NO GROWTH 5 DAYS  Final   Report Status 11/11/2015 FINAL  Final  Culture, blood (Routine X 2) w Reflex to ID Panel     Status: None   Collection Time: 11/06/15  8:29 AM  Result Value Ref Range Status   Specimen Description BLOOD LEFT HAND  Final   Special Requests BOTTLES DRAWN AEROBIC AND ANAEROBIC  1CC  Final   Culture NO GROWTH 5 DAYS  Final   Report Status 11/11/2015 FINAL  Final  Culture, expectorated sputum-assessment     Status: None   Collection Time: 11/06/15  8:41 AM  Result Value Ref Range Status   Specimen Description EXPECTORATED SPUTUM  Final   Special Requests Immunocompromised  Final   Sputum evaluation THIS SPECIMEN IS ACCEPTABLE FOR SPUTUM CULTURE  Final   Report Status 11/06/2015 FINAL  Final  Culture, respiratory (NON-Expectorated)     Status: None   Collection Time: 11/06/15  8:41 AM  Result Value Ref Range Status   Specimen Description EXPECTORATED SPUTUM  Final   Special Requests Immunocompromised Reflexed from [id number]  Final   Gram Stain   Final    MANY WBC SEEN MANY GRAM NEGATIVE RODS EXCELLENT SPECIMEN - 90-100% WBCS    Culture   Final    MODERATE GROWTH KLEBSIELLA PNEUMONIAE MODERATE GROWTH PSEUDOMONAS AERUGINOSA CRITICAL RESULT CALLED TO, READ BACK BY AND VERIFIED WITH: DR. FITZGERALD AT 1120 11/09/15 DV DR. FITZGERALD REQUESTED ORGANISM BE SENT OUT FOR TIGECYCLINE CEFT/AVIBACTAM AND COLISTIN Sent to [facility] for further susceptibility testing. 11/11/15  CTJ    Report Status 11/15/2015 FINAL  Final   Organism ID, Bacteria KLEBSIELLA PNEUMONIAE  Final   Organism ID, Bacteria PSEUDOMONAS AERUGINOSA  Final      Susceptibility   Klebsiella pneumoniae - MIC*    AMPICILLIN >=32 RESISTANT Resistant     CEFAZOLIN >=64 RESISTANT Resistant     CEFTRIAXONE  32 RESISTANT Resistant     CIPROFLOXACIN >=4 RESISTANT Resistant     GENTAMICIN >=16 RESISTANT Resistant     IMIPENEM >=16 RESISTANT Resistant     NITROFURANTOIN 256 RESISTANT Resistant     TRIMETH/SULFA >=320 RESISTANT Resistant     Extended ESBL POSITIVE Resistant     * MODERATE GROWTH KLEBSIELLA PNEUMONIAE   Pseudomonas aeruginosa - MIC*    CEFTAZIDIME >=64 RESISTANT Resistant     CIPROFLOXACIN 2 INTERMEDIATE Intermediate     GENTAMICIN >=16 RESISTANT Resistant     IMIPENEM >=16 RESISTANT Resistant     * MODERATE GROWTH PSEUDOMONAS AERUGINOSA  Culture, respiratory (NON-Expectorated)     Status: None   Collection Time: 11/14/15  2:07 PM  Result Value Ref Range Status   Specimen Description TRACHEAL ASPIRATE  Final   Special Requests NONE  Final   Gram Stain   Final    EXCELLENT SPECIMEN - 90-100% WBCS MANY WBC SEEN MODERATE GRAM NEGATIVE RODS    Culture   Final    HEAVY GROWTH PSEUDOMONAS AERUGINOSA CRITICAL VALUE NOTED.  VALUE IS CONSISTENT WITH PREVIOUSLY REPORTED AND CALLED VALUE.    Report Status 11/16/2015 FINAL  Final   Organism ID, Bacteria PSEUDOMONAS AERUGINOSA  Final      Susceptibility   Pseudomonas aeruginosa - MIC*    CEFTAZIDIME >=64 RESISTANT Resistant     CIPROFLOXACIN 2 INTERMEDIATE Intermediate     GENTAMICIN >=16 RESISTANT Resistant     IMIPENEM >=16 RESISTANT Resistant     PIP/TAZO Value in next row Resistant      RESISTANT>=128    TOBRAMYCIN Value in next row Sensitive      SENSITIVE2    * HEAVY GROWTH PSEUDOMONAS AERUGINOSA    Coagulation Studies: No results for input(s): LABPROT, INR in the last 72 hours.  Urinalysis: No results for input(s): COLORURINE, LABSPEC, PHURINE, GLUCOSEU, HGBUR, BILIRUBINUR, KETONESUR, PROTEINUR, UROBILINOGEN, NITRITE, LEUKOCYTESUR in the last 72 hours.  Invalid input(s): APPERANCEUR    Imaging: No results found.   Medications:   . dextrose 25 mL/hr at 12/07/15 2033  . feeding supplement (VITAL AF 1.2 CAL)  1,000 mL (12/07/15 2033)  . norepinephrine (LEVOPHED) Adult infusion 25 mcg/min (12/08/15 0242)   . albuterol  2.5 mg Nebulization Q6H  . amiodarone  100 mg Per Tube Daily  . antiseptic oral rinse  7 mL Mouth Rinse BID  . antiseptic oral rinse  7 mL Mouth Rinse QID  . aspirin  81 mg Per Tube Daily  . chlorhexidine gluconate  15 mL Mouth Rinse BID  . colistimethate (COLISTIN) IV  250 mg Intravenous Q48H  . epoetin (EPOGEN/PROCRIT) injection  10,000 Units Intravenous Q M,W,F-HD  . escitalopram  10 mg Per Tube Daily  . feeding supplement (PRO-STAT SUGAR FREE 64)  30 mL Per Tube 6 X Daily  . free water  20 mL Per Tube 6 times per day  . hydrocortisone  10 mg Per Tube BID  . lidocaine  1 patch Transdermal Q24H  . midodrine  10 mg Per Tube TID WC  . mirtazapine  7.5 mg Per Tube QHS  . multivitamin  5 mL Per  Tube Daily  . pantoprazole sodium  40 mg Per Tube Daily  . sodium chloride  10-40 mL Intracatheter Q12H  . sodium hypochlorite   Irrigation BID  . tigecycline (TYGACIL) IVPB  50 mg Intravenous Q12H  . tobramycin (PF)  300 mg Nebulization BID   acetaminophen (TYLENOL) oral liquid 160 mg/5 mL, albuterol, HYDROmorphone (DILAUDID) injection, loperamide, ondansetron (ZOFRAN) IV, oxyCODONE, polyvinyl alcohol, sodium chloride, zinc oxide  Assessment/ Plan:  50 y.o. 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 Admitted on  08/17/2015  Echo: 11/07/19: EF of 30% to 35%.   1. End-stage renal disease on hemodialysis on HD MWF. The patient has been on dialysis since October of 2014. R IJ permcath.  - Patient appears to have massive volume overload. We have had significant difficulty attempting any significant volume removal with dialysis. We have discussed potential plan of care with critical care as well as nursing. At this point in time we elected to place the patient back on continuous renal replacement therapy with ultrafiltration target of 60-70 cc per hour.  2. Anemia of CKD.   Continue Epogen 3 times a week.  3. Acute resp failure: ventilator dependent -FiO2 currently 35%. Hopefully ultrafiltration will help to remove some fluid and get FiO2 percentage down.   4. Anasarca - Remains one of the primary issues at present. Please see plan above.  5. Other:  - Sepsis:  Multi drug resistant organism - large st 4 decubitus - poor nutrition - not a candidate for outpatient dialysis.         LOS: 118 Geovonni Meyerhoff 1/19/20179:27 AM

## 2015-12-08 NOTE — Progress Notes (Signed)
PULMONARY / CRITICAL CARE MEDICINE   Name: Courtney [name] MRN: [id number] DOB: [birth year]    ADMISSION DATE:  2015/08/28  BRIEF HISTORY: 49 AAF who has been in medical facilities (hosp, LTAC, rehab) for 2 yrs following gastric bypass surgery with multiple complications. Now with chronic trach, ESRD, profound debilitation, severe sacral pressure ulcer. Was seemingly making progress and transferred to rehab facility approx one week prior to this admission. She was sent to [facility] ED with AMS and hypotension. Working dx of severe sepsis/septic shock due to infected sacral pressure ulcer. Since admission. Her course is been very complicated with numerous complications including septic shock and GI bleeding. Now with failure to wean from vent and possible MI event, repeat ECHO EF 30-35, possible old MI, severe hypokinesis of the anterior and anteroseptal myocardium   SUBJ:   The patient is minimally responsive today, 1/19.  She opens her eyes to look at me, but makes to attempt to communicate or interact.   Summary of MAJOR EVENTS/TEST RESULTS: Admission 02/07/14-05/07/14 Admission 07/21/14-09/06/14 Discharged to [facility]. Pt had palliative consult at that time, were asked to sign off by husband.  09/23 CT head: NAD 09/23 EEG: no epileptiform activity 09/23 PRBCs for Hgb 6.4 09/24 bedside debridement of sacral wound. Abscess drained 09/25 Off vasopressors. More alert. No distress. Worsening thrombocytopenia. Vanc DC'd 09/29 Dr. Sampson Goon (I.D) excused from the case by patient's husband. 10/02 MRI -multiple infarcts 10/03 tracheal bleeding- transfused platelets 10/03 hospitalist service excused from the case by patient's husband 10/03 Echocardiogram ejection fraction was 55-60%, pulmonary systolic pressure was 39 mmHg 95/62 restart TF's at lower rate, attempt reg HD  10/12 Transferred to med-surg floor. Remains on PCCM service 10/14 SLP eval: pt unable to tolerate PMV adequately 10/17-will  re-attempt PM valve-discussed with Speech therapist 10/18 passed swallow eval-start pureed thick foods no thin liquids-continue NG feeds 10/19 transferred to step down for sepsis/aspiration pneumonia 10/19 cxr shows RLL opacity 10/21 sacral decub debride at bedside by surgery 10/22 started back on vasopressors while on HD 10/27 CT with osteo, R hip fx, unable to identify tip of dubhoff tube - sent for fluoro study 10/28 Ortho consultation: I do not feel that she is a surgical candidate. Therefore, I feel that it would best to manage this fracture nonsurgically and allow it to heal by itself over time, which it should.  10/28 Gen Surg consultation: Due to the lack of free air or free flow of contrast and the peritoneum there is no indication for any surgical intervention on this. Would recommend pulling back the feeding tube 1-2 cm. Would recheck an abdominal film to confirm no pneumoperitoneum in the morning. Absence of any changes okay to continue using Dobbhoff for feeding and medications. 10/28 gastrograffin study: The study confirms that the feeding tube pes perforated through the duodenum and the tip is within a cavity that fills with injected contrast. The cavity does appear walled off 11/4 refuses oral feeds, continue TF's CT reviewed: T8-T9 discitis/osteomyelitis, RT HIP FRACTURE 11/5 placed back on vasopressors, placed back on Vent due to resp acidosis, levophed turned off 11/6 afternoon 11/5 PM Trach changed out due to cuff leak, #6 Shiley cuffed, 11/6 dubhoff occluded, removed and replaced, new dubhoff shows tube in the antral stomach 11/15 refusing to take oral feeds 11/18 placed back on Vent for increased WOB,SOB. 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 10/18/2015; the patient developed a new  left basal pneumonia; with recurrent sepsis.  10/19/2015; fevers resolved with IV acetaminophen. Tube feeds  changed from vital to Jevity.  12/7 CXR with stable b/l opacities.  12/12 elevated K s/p HD-remains on AC mode 12/16-vomiting-hold feeds and re-assess in next 24 hrs 12/17- TF restarted at 1/2 goal (goal = 50cc/per) 12/18- elevated wbc, fever (101.4), recultured, restarted on ceftaz 12/19 Pericardial friction rub. Echocardiogram: LVEF 30-35%, anteroseptal hypokinesis or akinesis c/w anterior wall MI 12/20 Dr Sung Amabile discussed new echocardiogram findings with pt's husband and explained that this likely represents an anterior wall MI sometime in the previous couple of weeks. It was explained that she is not a candidate for any cardiology intervention. Code status was again discussed. Husband expressed understanding that she was not going to survive this illness in any favorable way but continues to request that she be full code status based on what he believes to be her wishes 12/21 vomitied 50cc of TF - AM TF held, restart TF at 3pm @30cc  12/26 Persistent intermittent trach cuff leak. Distal XLT trach requested.  12/27 15 beat NSVT 12/28 patient refused HD and refused Bath time 12/29 D10 infusion for hypoglycemia, TF's on hold due to intolerance 12/30 CODE BLUE  VTACH 01/01 transfuse 2 units of blood 01/02 increased respiratory distress, copious purulent secretions 01/02 LE venous US: No DVT 01/03 Repeat TTE: The cavity size was mildly dilated. Systolic function was moderately to severely reduced. The estimated ejection fraction was in the range of 30% to 35%. Diffuse hypokinesis. Moderate to severe hypokinesis of the anteroseptal, anterior, and apical myocardium. Hypokinesis of the anterior myocardium 01/05 Hypothermic - Bair Hugger ordered 01/10-placed back on vasopressors for septic shock 1/11-failed SBT due to resp muscle fatigue 1/13-worsening shock and hypoxia, agonal respirations 1/17-Husband demanding EEG.  1/18-Trach changed by ENT from size 6 to 8- leak improved.  1/19- Not  tolerating HD, levophed up to 25 mc, nephro switching back to CRRT.   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 Pneumoniae, P. Mirabilis, VRE Wound 9/26 >> No growth Blood 9/23 >> NEG CDiff 9/27>>neg Stool Cx 10/15>> negative Cdiff 10/25>>neg Trach Aspirate 10/28>> light growth pseudomonas Blood 10/28 >> 1/2 GPC >>  Sputum cultures obtained 09/22/15 due to mucus plugging>>Pseudomonas BONE TISSUE Cx 11/3>>E.coli (ESBL), k. Pneumonia (daptomycin and septra) Bld Cx 11/27>>negative thus far.  Trach Asp 11/27>> grew out Pseudomonas, and Klebsiella pneumonia. Trach Asp 12/18>> MDR Klebsiella, Pseudomonas Bld Cx 12/18>> NEG 12/26 resp culture >> MDR pseudomonas  ANTIMICROBIALS:  Aztreonam 9/23 >> 9/24 Vanc 9/23 >> 9/25 Vanc 9/26>>9/27 Daptomycin 9/27>> 10/12, 10/28>> off Meropenem 9/23 >> 10/14, 10/28 >> 10/31 Zosyn 11/27,>> 11/30. Levaquin 11/29>> 11/29. Ceftaz 12/1>>12/11, 12/18>> 12/24 Colisitin 12/26 >>  Tigecycline 12/26 >>  Nebulized tobramycin 12/30 >>  VITAL SIGNS: Temp:  [93.7 F (34.3 C)-98.8 F (37.1 C)] 98.2 F (36.8 C) (01/19 0700) Pulse Rate:  [37-83] 82 (01/19 0700) Resp:  [0-21] 14 (01/19 0700) BP: (80-130)/(30-80) 114/41 mmHg (01/19 0700) SpO2:  [94 %-100 %] 99 % (01/19 0700) FiO2 (%):  [35 %] 35 % (01/19 0836) HEMODYNAMICS:   VENTILATOR SETTINGS: Vent Mode:  [-] PRVC FiO2 (%):  [35 %] 35 % Set  Rate:  [14 bmp] 14 bmp Vt Set:  [500 mL] 500 mL PEEP:  [5 cmH20] 5 cmH20 Plateau Pressure:  [34 cmH20] 34 cmH20 INTAKE / OUTPUT:  Intake/Output Summary (Last 24 hours) at 12/08/15 1117 Last data filed  at 12/08/15 0501  Gross per 24 hour  Intake 1026.57 ml  Output   1000 ml  Net  26.57 ml    Review of Systems  Unable to perform ROS: critical illness   Physical Exam  Constitutional:  Critical ill appearance  HENT:  Head: Normocephalic and atraumatic.  Right Ear: External ear normal.  Left Ear: External ear normal.  Eyes: Conjunctivae are normal. Pupils are equal, round, and reactive to light. Right eye exhibits no discharge. Left eye exhibits no discharge.  Neck: Neck supple. No tracheal deviation present. No thyromegaly present.  Cardiovascular: Regular rhythm, normal heart sounds and intact distal pulses.   No murmur heard. Pulmonary/Chest: She has no wheezes.  Tracheostomy - on MV  Abdominal: Soft. Bowel sounds are normal. She exhibits no distension.  Musculoskeletal: She exhibits edema.  Ext cool, trace symmetric edema, marked muscle wasting Severe sacral decubitus ulcer- to the spine  Skin: Skin is warm.  Nursing note and vitals reviewed.     LABS:  CBC  Recent Labs Lab 12/03/15 0552 12/05/15 0538 12/07/15 0433  WBC 15.5* 15.1* 18.6*  HGB 8.5* 7.2* 8.2*  HCT 28.0* 23.7* 27.5*  PLT 58* 62* 59*   Coag's No results for input(s): APTT, INR in the last 168 hours. BMET  Recent Labs Lab 12/02/15 1609 12/05/15 0538  NA 132* 131*  K 3.7 3.9  CL 96* 96*  CO2 29 29  BUN 95* 106*  CREATININE 1.34* 1.36*  GLUCOSE 186* 124*   Electrolytes  Recent Labs Lab 12/02/15 1609 12/05/15 0538  CALCIUM 8.2* 8.0*  MG  --  2.0  PHOS 5.6* 5.4*   Sepsis Markers No results for input(s): LATICACIDVEN, PROCALCITON, O2SATVEN in the last 168 hours. ABG No results for input(s): PHART, PCO2ART, PO2ART in the last 168 hours. Liver Enzymes  Recent Labs Lab 12/02/15 1609 12/05/15 0538  AST  --  43*  ALT  --  34  ALKPHOS  --  2594*  BILITOT  --  1.2  ALBUMIN 1.6* 1.7*   Cardiac Enzymes No results for input(s): TROPONINI, PROBNP in the last 168  hours. Glucose  Recent Labs Lab 12/07/15 0731 12/07/15 1140 12/07/15 1629 12/07/15 2354 12/07/15 2358 12/08/15 0751  GLUCAP 139* 142* 130* 137* 173* 132*    Imaging No results found.  ASSESSMENT / PLAN: IMPRESSION: Very prolonged and complicated hospitalization after gastric bypass surgery 2014 Never returned home ESRD-not being tolerated, discussed with nephro, switched to CRRT 1/19.  Recurrent severe sepsis - PAN resistant Klebisella in sputum Recurrent PNA-now with pseudomonas H/O C diff H/O VTE DM2 - controlled Episodic hypoglycemia Chronic steroid therapy, for a history of of adrenal insufficiency Incidental finding of R hip fx - conservative mgmt. 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  Chronic anemia Severe protein - calorie malnutrition TF intolerance Thrombocytopenia 01/13 - may need to consider stopping  heparin and ASA if plts continues to be low on lab recheck in the AM.  Cont vent support - settings reviewed no changes today.  Cont CRRT per renal Monitor BMET intermittently Monitor I/Os Correct electrolytes as indicated DVT px: SQ heparin Continue abx per ID.  Cont Amiodarone - 100 mg per tube daily Cont opiates PRN Continue TF, will attempt to advance to goal, limited by diarrhea and possible aspiration.  Vasopressors to keep SBP>90. Will check am cortisol.     Prognosis is very poor without hope for functional recovery. Patient is dialysis and Vent  dependent with severe sacral decub ulcer with chronic bone infection and spinal infection with underlying CHF and CVA with recurrent pneumonia with MDR organisms.    This patient in my opinion is deemed Futile. Attempted to call husband, Jonny Ruiz; no answer to cell phone.    Wells Guiles, M.D.   Critical Care Attestation.  I have personally obtained a history, examined the patient, evaluated laboratory and imaging results, formulated the assessment and plan and placed orders. The Patient requires high complexity decision making for assessment and support, frequent evaluation and titration of therapies, application of advanced monitoring technologies and extensive interpretation of multiple databases. The patient has critical illness that could lead imminently to failure of 1 or more organ systems and requires the highest level of physician preparedness to intervene.  Critical Care Time devoted to patient care services described in this note is 35 minutes and is exclusive of time spent in procedures.

## 2015-12-08 NOTE — Progress Notes (Signed)
Chaplain rounded in the unit and offered a compassionate presence and support to the patient.  Said silent prayer. Chaplain Inice Sanluis [phone] 

## 2015-12-08 NOTE — Progress Notes (Signed)
MEDICATION RELATED CONSULT NOTE - Follow up   Pharmacy Consult for CRRT Dosing  Allergies  Allergen Reactions  . Contrast Media [Iodinated Diagnostic Agents] Anaphylaxis  . Ampicillin Rash   Patient Measurements: Ht 54ft 9in Height:  (SCALE BROKE) Weight:  (No bedscale) IBW/kg (Calculated) : 66.2  Vital Signs: Temp: 98.2 F (36.8 C) (01/19 0700) BP: 114/41 mmHg (01/19 0700) Pulse Rate: 82 (01/19 0700)  Recent Labs  12/07/15 0433  WBC 18.6*  HGB 8.2*  HCT 27.5*  PLT 59*   Estimated Creatinine Clearance: 62.9 mL/min (by C-G formula based on Cr of 1.36).  Assessment: 50 yo patient on HD MWF to transition back to CRRT. Pharmacy consulted for renal dosing of medications.   Plan:  No medications require adjustment at present. Pharmacy will continue to monitor and dose adjust as needed.    Luisa Hart, PharmD Clinical Pharmacist  Clinical Pharmacist 12/08/2015

## 2015-12-09 LAB — RENAL FUNCTION PANEL
ALBUMIN: 1.6 g/dL — AB (ref 3.5–5.0)
Anion gap: 0 — ABNORMAL LOW (ref 5–15)
BUN: 83 mg/dL — ABNORMAL HIGH (ref 6–20)
CHLORIDE: 99 mmol/L — AB (ref 101–111)
CO2: 29 mmol/L (ref 22–32)
Calcium: 8.1 mg/dL — ABNORMAL LOW (ref 8.9–10.3)
Creatinine, Ser: 0.96 mg/dL (ref 0.44–1.00)
Glucose, Bld: 177 mg/dL — ABNORMAL HIGH (ref 65–99)
PHOSPHORUS: 4.2 mg/dL (ref 2.5–4.6)
POTASSIUM: 3.8 mmol/L (ref 3.5–5.1)
Sodium: 128 mmol/L — ABNORMAL LOW (ref 135–145)

## 2015-12-09 LAB — GLUCOSE, CAPILLARY
GLUCOSE-CAPILLARY: 153 mg/dL — AB (ref 65–99)
GLUCOSE-CAPILLARY: 161 mg/dL — AB (ref 65–99)
GLUCOSE-CAPILLARY: 167 mg/dL — AB (ref 65–99)

## 2015-12-09 LAB — CORTISOL: Cortisol, Plasma: 14.9 ug/dL

## 2015-12-09 LAB — MAGNESIUM: Magnesium: 1.9 mg/dL (ref 1.7–2.4)

## 2015-12-09 MED ORDER — SODIUM CHLORIDE 0.9 % IJ SOLN
INTRAMUSCULAR | Status: AC
  Filled 2015-12-09: qty 10

## 2015-12-09 MED ORDER — ALTEPLASE 2 MG IJ SOLR
2.0000 mg | Freq: Once | INTRAMUSCULAR | Status: AC
  Administered 2015-12-09: 2 mg
  Filled 2015-12-09: qty 2

## 2015-12-15 MED FILL — Medication: Qty: 1 | Status: AC

## 2015-12-21 NOTE — Progress Notes (Signed)
Assessment preformed at 0745. Cathflo Activase was given at 0545 by night nurse and reported off to day nurse. Reported Cathflo was remain in line for 2 hours. Cathflo 2.5 mL removed from arterial access and 2.5 mL removed from venous access @ 0805. Blood aspirated in both catheters. Flushed both catheters with total of 20 mL flush. CRRT machine set up in process since 0730.  Pt continues to have runs of ventricular bigeminy which is not new for the patient. At 0800 BP 78/42. Readjust cuff and recycled BP. 0811 BP 89/64. Charge nurse called to bedside. End tital CO2 dropped from 64 to 40's, unable to pick up reading from O2 probe. Probe replaced.  Pt brady down to 35, End tital CO2 18, no pulse palpable. Code called and code button pressed. See code sheet.

## 2015-12-21 NOTE — Progress Notes (Signed)
Subjective:  Patient switch back to CRRT yesterday. System clotted as the patient had to be moved and cleaned for bowel movements this a.m. Total input yesterday was significantly greater than output. We will plan to increase ultrafiltration target today.  Objective:  Vital signs in last 24 hours:  Temp:  [94.6 F (34.8 C)-99.1 F (37.3 C)] 97.3 F (36.3 C) (01/20 0630) Pulse Rate:  [38-84] 44 (01/20 0630) Resp:  [0-21] 0 (01/20 0630) BP: (93-141)/(34-97) 141/49 mmHg (01/20 0630) SpO2:  [89 %-100 %] 94 % (01/20 0753) FiO2 (%):  [35 %] 35 % (01/20 0235)  Weight change:  Filed Weights    Intake/Output: I/O last 3 completed shifts: In: 3113.5 [I.V.:1833.5; NG/GT:1080; IV Piggyback:200] Out: 539 [Other:537; Stool:2]   Intake/Output this shift:     Physical Exam: General: critically ill   Head/ENT: Moist oral mucosal membrane, +NG tube  Eyes: Anicteric, eyes open  Neck: Tracheostomy in place  Lungs:  vent assisted , diffuse crackles  Heart: S1S2 no rubs  Abdomen:  Soft, NT, BS present  Extremities: Anasarca, 3+ in LE's  Neurologic: Eyes open but not following commands   Access:  R IJ permcath.       Basic Metabolic Panel:  Recent Labs Lab 12/02/15 1609 12/05/15 0538 12/08/15 1628 12/01/2015 0423  NA 132* 131* 126* 128*  K 3.7 3.9 3.7 3.8  CL 96* 96* 91* 99*  CO2 29 29 29 29   GLUCOSE 186* 124* 406* 177*  BUN 95* 106* 90* 83*  CREATININE 1.34* 1.36* 1.27* 0.96  CALCIUM 8.2* 8.0* 7.6* 8.1*  MG  --  2.0  --   --   PHOS 5.6* 5.4* 4.8* 4.2    Liver Function Tests:  Recent Labs Lab 12/02/15 1609 12/05/15 0538 12/08/15 1628 12/17/2015 0423  AST  --  43*  --   --   ALT  --  34  --   --   ALKPHOS  --  2594*  --   --   BILITOT  --  1.2  --   --   PROT  --  4.9*  --   --   ALBUMIN 1.6* 1.7* 1.6* 1.6*   No results for input(s): LIPASE, AMYLASE in the last 168 hours. No results for input(s): AMMONIA in the last 168 hours.  CBC:  Recent Labs Lab  12/02/15 1609 12/03/15 0552 12/05/15 0538 12/07/15 0433  WBC 13.5* 15.5* 15.1* 18.6*  NEUTROABS  --   --  12.7*  --   HGB 8.8* 8.5* 7.2* 8.2*  HCT 30.4* 28.0* 23.7* 27.5*  MCV 98.8 98.0 98.7 101.0*  PLT 58* 58* 62* 59*    Cardiac Enzymes: No results for input(s): CKTOTAL, CKMB, CKMBINDEX, TROPONINI in the last 168 hours.  BNP: Invalid input(s): POCBNP  CBG:  Recent Labs Lab 12/07/15 2358 12/08/15 0751 12/08/15 1632 12/07/2015 0001 12/06/2015 0726  GLUCAP 173* 132* 139* 161* 167*    Microbiology: Results for orders placed or performed during the hospital encounter of 2015-08-20  Blood Culture (routine x 2)     Status: None   Collection Time: Aug 20, 2015  8:51 AM  Result Value Ref Range Status   Specimen Description BLOOD [name]  Final   Special Requests BOTTLES DRAWN AEROBIC AND ANAEROBIC  3CC  Final   Culture NO GROWTH 5 DAYS  Final   Report Status 08/17/2015 FINAL  Final  Blood Culture (routine x 2)     Status: None   Collection Time: 08/20/2015  9:20 AM  Result  Value Ref Range Status   Specimen Description BLOOD LEFT ARM  Final   Special Requests BOTTLES DRAWN AEROBIC AND ANAEROBIC  1CC  Final   Culture NO GROWTH 5 DAYS  Final   Report Status 08/17/2015 FINAL  Final  Wound culture     Status: None   Collection Time: 2015/08/15  9:20 AM  Result Value Ref Range Status   Specimen Description DECUBITIS  Final   Special Requests Normal  Final   Gram Stain   Final    FEW WBC SEEN MANY GRAM NEGATIVE RODS RARE GRAM POSITIVE COCCI    Culture   Final    HEAVY GROWTH ESCHERICHIA COLI MODERATE GROWTH ENTEROBACTER AEROGENES PROTEUS MIRABILIS HEAVY GROWTH ENTEROCOCCUS SPECIES VRE HAVE INTRINSIC RESISTANCE TO MOST COMMONLY USED ANTIBIOTICS AND THE ABILITY TO ACQUIRE RESISTANCE TO MOST AVAILABLE ANTIBIOTICS.    Report Status 08/16/2015 FINAL  Final   Organism ID, Bacteria ESCHERICHIA COLI  Final   Organism ID, Bacteria ENTEROBACTER AEROGENES  Final   Organism ID, Bacteria  PROTEUS MIRABILIS  Final   Organism ID, Bacteria ENTEROCOCCUS SPECIES  Final      Susceptibility   Enterobacter aerogenes - MIC*    CEFTAZIDIME <=1 SENSITIVE Sensitive     CEFAZOLIN >=64 RESISTANT Resistant     CEFTRIAXONE <=1 SENSITIVE Sensitive     CIPROFLOXACIN <=0.25 SENSITIVE Sensitive     GENTAMICIN <=1 SENSITIVE Sensitive     IMIPENEM 1 SENSITIVE Sensitive     TRIMETH/SULFA <=20 SENSITIVE Sensitive     * MODERATE GROWTH ENTEROBACTER AEROGENES   Escherichia coli - MIC*    AMPICILLIN <=2 SENSITIVE Sensitive     CEFTAZIDIME <=1 SENSITIVE Sensitive     CEFAZOLIN <=4 SENSITIVE Sensitive     CEFTRIAXONE <=1 SENSITIVE Sensitive     CIPROFLOXACIN <=0.25 SENSITIVE Sensitive     GENTAMICIN <=1 SENSITIVE Sensitive     IMIPENEM <=0.25 SENSITIVE Sensitive     TRIMETH/SULFA <=20 SENSITIVE Sensitive     * HEAVY GROWTH ESCHERICHIA COLI   Proteus mirabilis - MIC*    AMPICILLIN >=32 RESISTANT Resistant     CEFTAZIDIME <=1 SENSITIVE Sensitive     CEFAZOLIN 8 SENSITIVE Sensitive     CEFTRIAXONE <=1 SENSITIVE Sensitive     CIPROFLOXACIN <=0.25 SENSITIVE Sensitive     GENTAMICIN <=1 SENSITIVE Sensitive     IMIPENEM 1 SENSITIVE Sensitive     TRIMETH/SULFA <=20 SENSITIVE Sensitive     * PROTEUS MIRABILIS   Enterococcus species - MIC*    AMPICILLIN >=32 RESISTANT Resistant     VANCOMYCIN >=32 RESISTANT Resistant     GENTAMICIN SYNERGY SENSITIVE Sensitive     TETRACYCLINE Value in next row Resistant      RESISTANT>=16    * HEAVY GROWTH ENTEROCOCCUS SPECIES  MRSA PCR Screening     Status: None   Collection Time: 15-Aug-2015  2:38 PM  Result Value Ref Range Status   MRSA by PCR NEGATIVE NEGATIVE Final    Comment:        The GeneXpert MRSA Assay (FDA approved for NASAL specimens only), is one component of a comprehensive MRSA colonization surveillance program. It is not intended to diagnose MRSA infection nor to guide or monitor treatment for MRSA infections.   Blood culture (single)      Status: None   Collection Time: Aug 15, 2015  3:36 PM  Result Value Ref Range Status   Specimen Description BLOOD RIGHT ASSIST CONTROL  Final   Special Requests BOTTLES DRAWN AEROBIC AND ANAEROBIC  Final   Culture NO GROWTH 5 DAYS  Final   Report Status 08/17/2015 FINAL  Final  Wound culture     Status: None   Collection Time: 08/13/15 12:37 PM  Result Value Ref Range Status   Specimen Description WOUND  Final   Special Requests Normal  Final   Gram Stain   Final    FEW WBC SEEN TOO NUMEROUS TO COUNT GRAM NEGATIVE RODS FEW GRAM POSITIVE COCCI    Culture   Final    HEAVY GROWTH ESCHERICHIA COLI MODERATE GROWTH PROTEUS MIRABILIS LIGHT GROWTH KLEBSIELLA PNEUMONIAE MODERATE GROWTH ENTEROCOCCUS GALLINARUM CRITICAL RESULT CALLED TO, READ BACK BY AND VERIFIED WITH: [facility] BORBA AT 1042 08/16/15 DV    Report Status 08/17/2015 FINAL  Final   Organism ID, Bacteria ESCHERICHIA COLI  Final   Organism ID, Bacteria PROTEUS MIRABILIS  Final   Organism ID, Bacteria KLEBSIELLA PNEUMONIAE  Final   Organism ID, Bacteria ENTEROCOCCUS GALLINARUM  Final      Susceptibility   Escherichia coli - MIC*    AMPICILLIN >=32 RESISTANT Resistant     CEFTAZIDIME 4 RESISTANT Resistant     CEFAZOLIN >=64 RESISTANT Resistant     CEFTRIAXONE 16 RESISTANT Resistant     GENTAMICIN 2 SENSITIVE Sensitive     IMIPENEM >=16 RESISTANT Resistant     TRIMETH/SULFA <=20 SENSITIVE Sensitive     Extended ESBL POSITIVE Resistant     PIP/TAZO Value in next row Resistant      RESISTANT>=128    CIPROFLOXACIN Value in next row Sensitive      SENSITIVE<=0.25    * HEAVY GROWTH ESCHERICHIA COLI   Klebsiella pneumoniae - MIC*    AMPICILLIN Value in next row Resistant      SENSITIVE<=0.25    CEFTAZIDIME Value in next row Resistant      SENSITIVE<=0.25    CEFAZOLIN Value in next row Resistant      SENSITIVE<=0.25    CEFTRIAXONE Value in next row Resistant      SENSITIVE<=0.25    CIPROFLOXACIN Value in next row Resistant       SENSITIVE<=0.25    GENTAMICIN Value in next row Sensitive      SENSITIVE<=0.25    IMIPENEM Value in next row Resistant      SENSITIVE<=0.25    TRIMETH/SULFA Value in next row Resistant      SENSITIVE<=0.25    PIP/TAZO Value in next row Resistant      RESISTANT>=128    * LIGHT GROWTH KLEBSIELLA PNEUMONIAE   Proteus mirabilis - MIC*    AMPICILLIN Value in next row Resistant      RESISTANT>=128    CEFTAZIDIME Value in next row Sensitive      RESISTANT>=128    CEFAZOLIN Value in next row Sensitive      RESISTANT>=128    CEFTRIAXONE Value in next row Sensitive      RESISTANT>=128    CIPROFLOXACIN Value in next row Sensitive      RESISTANT>=128    GENTAMICIN Value in next row Sensitive      RESISTANT>=128    IMIPENEM Value in next row Sensitive      RESISTANT>=128    TRIMETH/SULFA Value in next row Sensitive      RESISTANT>=128    PIP/TAZO Value in next row Sensitive      SENSITIVE<=4    * MODERATE GROWTH PROTEUS MIRABILIS   Enterococcus gallinarum - MIC*    AMPICILLIN Value in next row Resistant      SENSITIVE<=4  GENTAMICIN SYNERGY Value in next row Sensitive      SENSITIVE<=4    CIPROFLOXACIN Value in next row Resistant      RESISTANT>=8    TETRACYCLINE Value in next row Resistant      RESISTANT>=16    * MODERATE GROWTH ENTEROCOCCUS GALLINARUM  Wound culture     Status: None   Collection Time: 08/15/15  2:53 PM  Result Value Ref Range Status   Specimen Description WOUND  Final   Special Requests NONE  Final   Gram Stain FEW WBC SEEN NO ORGANISMS SEEN   Final   Culture NO GROWTH 3 DAYS  Final   Report Status 08/18/2015 FINAL  Final  C difficile quick scan w PCR reflex     Status: None   Collection Time: 08/17/15 11:34 AM  Result Value Ref Range Status   C Diff antigen NEGATIVE NEGATIVE Final   C Diff toxin NEGATIVE NEGATIVE Final   C Diff interpretation Negative for C. difficile  Final  Stool culture     Status: None   Collection Time: 09/03/15  3:51 PM   Result Value Ref Range Status   Specimen Description STOOL  Final   Special Requests Immunocompromised  Final   Culture   Final    NO SALMONELLA OR SHIGELLA ISOLATED No Pathogenic E. coli detected NO CAMPYLOBACTER DETECTED    Report Status 09/07/2015 FINAL  Final  C difficile quick scan w PCR reflex     Status: None   Collection Time: 09/13/15 12:51 AM  Result Value Ref Range Status   C Diff antigen NEGATIVE NEGATIVE Final   C Diff toxin NEGATIVE NEGATIVE Final   C Diff interpretation Negative for C. difficile  Final  Culture, blood (routine x 2)     Status: None   Collection Time: 09/16/15 11:24 AM  Result Value Ref Range Status   Specimen Description BLOOD LEFT HAND  Final   Special Requests BOTTLES DRAWN AEROBIC AND ANAEROBIC  1CC  Final   Culture NO GROWTH 5 DAYS  Final   Report Status 09/21/2015 FINAL  Final  Culture, blood (routine x 2)     Status: None   Collection Time: 09/16/15 12:23 PM  Result Value Ref Range Status   Specimen Description BLOOD RIGHT HAND  Final   Special Requests BOTTLES DRAWN AEROBIC AND ANAEROBIC  1CC  Final   Culture  Setup Time   Final    GRAM POSITIVE COCCI AEROBIC BOTTLE ONLY CRITICAL RESULT CALLED TO, READ BACK BY AND VERIFIED WITH: TESS THOMAS,RN 09/17/2015 0631 BY JRS.    Culture   Final    ENTEROCOCCUS FAECALIS AEROBIC BOTTLE ONLY VRE HAVE INTRINSIC RESISTANCE TO MOST COMMONLY USED ANTIBIOTICS AND THE ABILITY TO ACQUIRE RESISTANCE TO MOST AVAILABLE ANTIBIOTICS. CRITICAL RESULT CALLED TO, READ BACK BY AND VERIFIED WITH: CHERYL SMITH AT 4540 09/19/15 DV    Report Status 09/21/2015 FINAL  Final   Organism ID, Bacteria ENTEROCOCCUS FAECALIS  Final      Susceptibility   Enterococcus faecalis - MIC*    AMPICILLIN <=2 SENSITIVE Sensitive     LINEZOLID 2 SENSITIVE Sensitive     CIPROFLOXACIN Value in next row Resistant      RESISTANT>=8    TETRACYCLINE Value in next row Resistant      RESISTANT>=16    VANCOMYCIN Value in next row  Resistant      RESISTANT>=32    GENTAMICIN SYNERGY Value in next row Resistant      RESISTANT>=32    *  ENTEROCOCCUS FAECALIS  Culture, respiratory (NON-Expectorated)     Status: None   Collection Time: 09/16/15  3:50 PM  Result Value Ref Range Status   Specimen Description TRACHEAL ASPIRATE  Final   Special Requests Immunocompromised  Final   Gram Stain   Final    FEW WBC SEEN GOOD SPECIMEN - 80-90% WBCS RARE GRAM NEGATIVE RODS    Culture LIGHT GROWTH PSEUDOMONAS AERUGINOSA  Final   Report Status 09/19/2015 FINAL  Final   Organism ID, Bacteria PSEUDOMONAS AERUGINOSA  Final      Susceptibility   Pseudomonas aeruginosa - MIC*    CEFTAZIDIME 8 SENSITIVE Sensitive     CIPROFLOXACIN 2 INTERMEDIATE Intermediate     GENTAMICIN >=16 RESISTANT Resistant     IMIPENEM >=16 RESISTANT Resistant     PIP/TAZO Value in next row Sensitive      SENSITIVE32    CEFEPIME Value in next row Sensitive      SENSITIVE8    LEVOFLOXACIN Value in next row Resistant      RESISTANT>=8    * LIGHT GROWTH PSEUDOMONAS AERUGINOSA  Culture, expectorated sputum-assessment     Status: None   Collection Time: 09/22/15  2:09 PM  Result Value Ref Range Status   Specimen Description ENDOTRACHEAL  Final   Special Requests Normal  Final   Sputum evaluation THIS SPECIMEN IS ACCEPTABLE FOR SPUTUM CULTURE  Final   Report Status 09/24/2015 FINAL  Final  Culture, respiratory (NON-Expectorated)     Status: None   Collection Time: 09/22/15  2:09 PM  Result Value Ref Range Status   Specimen Description ENDOTRACHEAL  Final   Special Requests Normal Reflexed from [id number]  Final   Gram Stain   Final    FEW WBC SEEN FEW GRAM NEGATIVE RODS POOR SPECIMEN - LESS THAN 70% WBCS    Culture   Final    MODERATE GROWTH PSEUDOMONAS AERUGINOSA LIGHT GROWTH KLEBSIELLA PNEUMONIAE REFER TO SENSITIVITIES FROM PREVIOUS CULTURE FOR ORG 2    Report Status 10/01/2015 FINAL  Final   Organism ID, Bacteria PSEUDOMONAS AERUGINOSA  Final    Organism ID, Bacteria KLEBSIELLA PNEUMONIAE  Final      Susceptibility   Pseudomonas aeruginosa - MIC*    CEFTAZIDIME 4 SENSITIVE Sensitive     CIPROFLOXACIN 2 INTERMEDIATE Intermediate     GENTAMICIN >=16 RESISTANT Resistant     IMIPENEM >=16 RESISTANT Resistant     PIP/TAZO Value in next row Sensitive      SENSITIVE16    * MODERATE GROWTH PSEUDOMONAS AERUGINOSA  Tissue culture     Status: None   Collection Time: 09/24/15  6:44 AM  Result Value Ref Range Status   Specimen Description BONE  Final   Special Requests Normal  Final   Gram Stain MODERATE WBC SEEN FEW GRAM NEGATIVE RODS   Final   Culture   Final    MODERATE GROWTH ESCHERICHIA COLI MODERATE GROWTH KLEBSIELLA PNEUMONIAE ESBL-EXTENDED SPECTRUM BETA LACTAMASE-THE ORGANISM IS RESISTANT TO PENICILLINS, CEPHALOSPORINS AND AZTREONAM ACCORDING TO CLSI M100-S15 VOL.25 N01 JAN 2005. ORGANISM 1 This organism isolate is resistant to one or more antiotic agents in three or more antimicrobial categories.  Suggest Infectious Disease consult.   ORGANISM 2 CRITICAL RESULT CALLED TO, READ BACK BY AND VERIFIED WITH: RN Mardene Celeste LINDSAY 09/27/15 1005AM    Report Status 09/28/2015 FINAL  Final   Organism ID, Bacteria ESCHERICHIA COLI  Final   Organism ID, Bacteria KLEBSIELLA PNEUMONIAE  Final      Susceptibility   Escherichia  coli - MIC*    AMPICILLIN >=32 RESISTANT Resistant     CEFTAZIDIME 4 RESISTANT Resistant     CEFAZOLIN >=64 RESISTANT Resistant     CEFTRIAXONE 8 RESISTANT Resistant     GENTAMICIN <=1 SENSITIVE Sensitive     IMIPENEM 8 RESISTANT Resistant     TRIMETH/SULFA <=20 SENSITIVE Sensitive     Extended ESBL POSITIVE Resistant     PIP/TAZO Value in next row Resistant      RESISTANT>=128    * MODERATE GROWTH ESCHERICHIA COLI   Klebsiella pneumoniae - MIC*    AMPICILLIN Value in next row Resistant      RESISTANT>=128    CEFTAZIDIME Value in next row Resistant      RESISTANT>=128    CEFAZOLIN Value in next row Resistant       RESISTANT>=128    CEFTRIAXONE Value in next row Resistant      RESISTANT>=128    CIPROFLOXACIN Value in next row Resistant      RESISTANT>=128    GENTAMICIN Value in next row Resistant      RESISTANT>=128    IMIPENEM Value in next row Resistant      RESISTANT>=128    TRIMETH/SULFA Value in next row Sensitive      RESISTANT>=128    PIP/TAZO Value in next row Resistant      RESISTANT>=128    * MODERATE GROWTH KLEBSIELLA PNEUMONIAE  Anaerobic culture     Status: None   Collection Time: 09/24/15  3:55 PM  Result Value Ref Range Status   Specimen Description BONE  Final   Special Requests Normal  Final   Culture NO ANAEROBES ISOLATED  Final   Report Status 09/29/2015 FINAL  Final  Culture, fungus without smear     Status: None   Collection Time: 09/24/15  3:55 PM  Result Value Ref Range Status   Specimen Description BONE  Final   Special Requests Normal  Final   Culture NO FUNGUS ISOLATED AFTER 24 DAYS  Final   Report Status 10/18/2015 FINAL  Final  C difficile quick scan w PCR reflex     Status: None   Collection Time: 10/07/15  2:02 PM  Result Value Ref Range Status   C Diff antigen NEGATIVE NEGATIVE Final   C Diff toxin NEGATIVE NEGATIVE Final   C Diff interpretation Negative for C. difficile  Final  Culture, blood (routine x 2)     Status: None   Collection Time: 10/16/15  9:52 AM  Result Value Ref Range Status   Specimen Description BLOOD LEFT HAND  Final   Special Requests   Final    BOTTLES DRAWN AEROBIC AND ANAEROBIC  AER 6CC ANA 4CC   Culture NO GROWTH 6 DAYS  Final   Report Status 10/22/2015 FINAL  Final  Culture, blood (routine x 2)     Status: None   Collection Time: 10/16/15 12:25 PM  Result Value Ref Range Status   Specimen Description BLOOD LEFT HAND  Final   Special Requests BOTTLES DRAWN AEROBIC AND ANAEROBIC  5CC  Final   Culture NO GROWTH 6 DAYS  Final   Report Status 10/22/2015 FINAL  Final  Culture, expectorated sputum-assessment     Status: None    Collection Time: 10/18/15  1:15 PM  Result Value Ref Range Status   Specimen Description SPUTUM  Final   Special Requests NONE  Final   Sputum evaluation THIS SPECIMEN IS ACCEPTABLE FOR SPUTUM CULTURE  Final   Report Status 10/18/2015 FINAL  Final  Culture, respiratory (NON-Expectorated)     Status: None   Collection Time: 10/18/15  1:15 PM  Result Value Ref Range Status   Specimen Description SPUTUM  Final   Special Requests NONE Reflexed from [id number]  Final   Gram Stain   Final    GOOD SPECIMEN - 80-90% WBCS MODERATE WBC SEEN MANY GRAM NEGATIVE RODS    Culture   Final    HEAVY GROWTH PSEUDOMONAS AERUGINOSA MODERATE GROWTH KLEBSIELLA PNEUMONIAE CRITICAL RESULT CALLED TO, READ BACK BY AND VERIFIED WITHRod Mae AT 1610 10/21/15 DV KLEBSIELLA PNEUMONIAE This organism isolate is resistant to one or more antiotic agents in three or more antimicrobial categories.  Suggest Infectious Disease  consult.      Report Status 10/22/2015 FINAL  Final   Organism ID, Bacteria PSEUDOMONAS AERUGINOSA  Final   Organism ID, Bacteria KLEBSIELLA PNEUMONIAE  Final      Susceptibility   Klebsiella pneumoniae - MIC*    AMPICILLIN >=32 RESISTANT Resistant     CEFAZOLIN >=64 RESISTANT Resistant     CEFTRIAXONE >=64 RESISTANT Resistant     CIPROFLOXACIN >=4 RESISTANT Resistant     GENTAMICIN >=16 RESISTANT Resistant     IMIPENEM >=16 RESISTANT Resistant     NITROFURANTOIN >=512 RESISTANT Resistant     TRIMETH/SULFA <=20 SENSITIVE Sensitive     * MODERATE GROWTH KLEBSIELLA PNEUMONIAE   Pseudomonas aeruginosa - MIC*    CEFTAZIDIME 4 SENSITIVE Sensitive     CIPROFLOXACIN >=4 RESISTANT Resistant     GENTAMICIN 8 INTERMEDIATE Intermediate     IMIPENEM >=16 RESISTANT Resistant     PIP/TAZO Value in next row Sensitive      SENSITIVE32    * HEAVY GROWTH PSEUDOMONAS AERUGINOSA  C difficile quick scan w PCR reflex     Status: None   Collection Time: 10/18/15  4:39 PM  Result Value Ref Range Status    C Diff antigen NEGATIVE NEGATIVE Final   C Diff toxin NEGATIVE NEGATIVE Final   C Diff interpretation Negative for C. difficile  Final  Culture, blood (Routine X 2) w Reflex to ID Panel     Status: None   Collection Time: 11/06/15  8:27 AM  Result Value Ref Range Status   Specimen Description BLOOD RIGHT HAND  Final   Special Requests BOTTLES DRAWN AEROBIC AND ANAEROBIC 3CC  Final   Culture NO GROWTH 5 DAYS  Final   Report Status 11/11/2015 FINAL  Final  Culture, blood (Routine X 2) w Reflex to ID Panel     Status: None   Collection Time: 11/06/15  8:29 AM  Result Value Ref Range Status   Specimen Description BLOOD LEFT HAND  Final   Special Requests BOTTLES DRAWN AEROBIC AND ANAEROBIC  1CC  Final   Culture NO GROWTH 5 DAYS  Final   Report Status 11/11/2015 FINAL  Final  Culture, expectorated sputum-assessment     Status: None   Collection Time: 11/06/15  8:41 AM  Result Value Ref Range Status   Specimen Description EXPECTORATED SPUTUM  Final   Special Requests Immunocompromised  Final   Sputum evaluation THIS SPECIMEN IS ACCEPTABLE FOR SPUTUM CULTURE  Final   Report Status 11/06/2015 FINAL  Final  Culture, respiratory (NON-Expectorated)     Status: None   Collection Time: 11/06/15  8:41 AM  Result Value Ref Range Status   Specimen Description EXPECTORATED SPUTUM  Final   Special Requests Immunocompromised Reflexed from [id number]  Final   Gram Stain  Final    MANY WBC SEEN MANY GRAM NEGATIVE RODS EXCELLENT SPECIMEN - 90-100% WBCS    Culture   Final    MODERATE GROWTH KLEBSIELLA PNEUMONIAE MODERATE GROWTH PSEUDOMONAS AERUGINOSA CRITICAL RESULT CALLED TO, READ BACK BY AND VERIFIED WITH: DR. FITZGERALD AT 1120 11/09/15 DV DR. FITZGERALD REQUESTED ORGANISM BE SENT OUT FOR TIGECYCLINE CEFT/AVIBACTAM AND COLISTIN Sent to [facility] for further susceptibility testing. 11/11/15 CTJ    Report Status 11/15/2015 FINAL  Final   Organism ID, Bacteria KLEBSIELLA PNEUMONIAE  Final   Organism  ID, Bacteria PSEUDOMONAS AERUGINOSA  Final      Susceptibility   Klebsiella pneumoniae - MIC*    AMPICILLIN >=32 RESISTANT Resistant     CEFAZOLIN >=64 RESISTANT Resistant     CEFTRIAXONE 32 RESISTANT Resistant     CIPROFLOXACIN >=4 RESISTANT Resistant     GENTAMICIN >=16 RESISTANT Resistant     IMIPENEM >=16 RESISTANT Resistant     NITROFURANTOIN 256 RESISTANT Resistant     TRIMETH/SULFA >=320 RESISTANT Resistant     Extended ESBL POSITIVE Resistant     * MODERATE GROWTH KLEBSIELLA PNEUMONIAE   Pseudomonas aeruginosa - MIC*    CEFTAZIDIME >=64 RESISTANT Resistant     CIPROFLOXACIN 2 INTERMEDIATE Intermediate     GENTAMICIN >=16 RESISTANT Resistant     IMIPENEM >=16 RESISTANT Resistant     * MODERATE GROWTH PSEUDOMONAS AERUGINOSA  Culture, respiratory (NON-Expectorated)     Status: None   Collection Time: 11/14/15  2:07 PM  Result Value Ref Range Status   Specimen Description TRACHEAL ASPIRATE  Final   Special Requests NONE  Final   Gram Stain   Final    EXCELLENT SPECIMEN - 90-100% WBCS MANY WBC SEEN MODERATE GRAM NEGATIVE RODS    Culture   Final    HEAVY GROWTH PSEUDOMONAS AERUGINOSA CRITICAL VALUE NOTED.  VALUE IS CONSISTENT WITH PREVIOUSLY REPORTED AND CALLED VALUE.    Report Status 11/16/2015 FINAL  Final   Organism ID, Bacteria PSEUDOMONAS AERUGINOSA  Final      Susceptibility   Pseudomonas aeruginosa - MIC*    CEFTAZIDIME >=64 RESISTANT Resistant     CIPROFLOXACIN 2 INTERMEDIATE Intermediate     GENTAMICIN >=16 RESISTANT Resistant     IMIPENEM >=16 RESISTANT Resistant     PIP/TAZO Value in next row Resistant      RESISTANT>=128    TOBRAMYCIN Value in next row Sensitive      SENSITIVE2    * HEAVY GROWTH PSEUDOMONAS AERUGINOSA    Coagulation Studies: No results for input(s): LABPROT, INR in the last 72 hours.  Urinalysis: No results for input(s): COLORURINE, LABSPEC, PHURINE, GLUCOSEU, HGBUR, BILIRUBINUR, KETONESUR, PROTEINUR, UROBILINOGEN, NITRITE,  LEUKOCYTESUR in the last 72 hours.  Invalid input(s): APPERANCEUR    Imaging: No results found.   Medications:   . dextrose 25 mL/hr at 12/08/15 0700  . feeding supplement (VITAL AF 1.2 CAL) 1,000 mL (12/08/15 2005)  . norepinephrine (LEVOPHED) Adult infusion 33 mcg/min (12-15-2015 0616)  . pureflow 3 each (12/08/15 1644)   . albuterol  2.5 mg Nebulization Q6H  . amiodarone  100 mg Per Tube Daily  . antiseptic oral rinse  7 mL Mouth Rinse BID  . antiseptic oral rinse  7 mL Mouth Rinse QID  . aspirin  81 mg Per Tube Daily  . chlorhexidine gluconate  15 mL Mouth Rinse BID  . colistimethate (COLISTIN) IV  250 mg Intravenous Q48H  . epoetin (EPOGEN/PROCRIT) injection  10,000 Units Intravenous Q M,W,F-HD  . escitalopram  10 mg Per Tube Daily  . feeding supplement (PRO-STAT SUGAR FREE 64)  30 mL Per Tube 6 X Daily  . free water  20 mL Per Tube 6 times per day  . hydrocortisone  10 mg Per Tube BID  . lidocaine  1 patch Transdermal Q24H  . midodrine  10 mg Per Tube TID WC  . mirtazapine  7.5 mg Per Tube QHS  . multivitamin  5 mL Per Tube Daily  . pantoprazole sodium  40 mg Per Tube Daily  . sodium chloride  10-40 mL Intracatheter Q12H  . sodium chloride      . sodium hypochlorite   Irrigation BID  . tigecycline (TYGACIL) IVPB  50 mg Intravenous Q12H  . tobramycin (PF)  300 mg Nebulization BID   acetaminophen (TYLENOL) oral liquid 160 mg/5 mL, albuterol, heparin, HYDROmorphone (DILAUDID) injection, loperamide, ondansetron (ZOFRAN) IV, oxyCODONE, polyvinyl alcohol, sodium chloride, zinc oxide  Assessment/ Plan:  50 y.o. 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 Admitted on 08/09/2015  Echo: 11/07/19: EF of 30% to 35%.   1. End-stage renal disease on hemodialysis on HD MWF. The patient has been on dialysis since October of 2014. R IJ permcath. Switched back to CRRT 12/08/15. - UF yesterday was only documented as being 537 cc. Patient remains in that positive by 2 L. Therefore we will attempt to increase ultrafiltration target to 100 cc per hour. She will likely need ongoing Levophed to maintain blood pressure. Continue to periodically monitor electrolytes.  2. Anemia of CKD.   Hemoglobin 8.2 at present. Continue Epogen with dialysis.  3. Acute resp failure: ventilator dependent -Remains ventilator dependent and unlikely to be weaned from the ventilator given weakness of diaphragm.   4. Anasarca - 2 L positive yesterday. Increasing ultrafiltration target as above.  5. Other:  - Sepsis:  Multi drug resistant organism - large st 4 decubitus - poor nutrition - not a candidate for outpatient dialysis.         LOS: 119 Alyn Jurney 2017/01/298:00 AM

## 2015-12-21 NOTE — Discharge Summary (Addendum)
Physician Discharge Summary  Patient ID: Courtney [name] MRN: [id number] DOB/AGE: [id number] 51 y.o.  Admit date: 08/04/2015 Discharge date: December 17, 2015  Admission Diagnoses:Sepsis.   Discharge Diagnoses:  Final cause of death likely cardiac arrest due to septic shock. Septic shock, likely related to chronic pneumonia and/or sacral osteomyelitis. Patient also had volume overload due to congestive heart failure, pulmonary edema, worsened by renal failure. All the above, likely exacerbated by chronic malnutrition, and also contributed from metabolic and anoxic encephalopathy.   Active Problems:   Septic shock (HCC)   Pressure ulcer   Decubitus ulcer   Gastrointestinal bleeding   Protein-calorie malnutrition, severe (HCC)   Dyspnea   Respiratory failure (HCC)   Unresponsive   Cerebrovascular accident (CVA) (HCC)   Aspiration pneumonia (HCC)   Chronic respiratory failure (HCC)   Adjustment disorder with depressed mood   Ventilator dependence (HCC)   PSVT (paroxysmal supraventricular tachycardia) (HCC)   ESRD (end stage renal disease) (HCC)   Diaphragm paralysis   Discharged Condition: expired.   Hospital Course: Please see notes.   Summary of MAJOR EVENTS/TEST RESULTS: Admission 02/07/14-05/07/14 Admission 07/21/14-09/06/14 Discharged to [facility]. Pt had palliative consult at that time, were asked to sign off by husband.  09/23 CT head: NAD 09/23 EEG: no epileptiform activity 09/23 PRBCs for Hgb 6.4 09/24 bedside debridement of sacral wound. Abscess drained 09/25 Off vasopressors. More alert. No distress. Worsening thrombocytopenia. Vanc DC'd 09/29 Dr. Sampson Goon (I.D) excused from the case by patient's husband. 10/02 MRI -multiple infarcts 10/03 tracheal bleeding- transfused platelets 10/03 hospitalist service excused from the case by patient's husband 10/03 Echocardiogram ejection fraction was 55-60%, pulmonary systolic pressure was 39 mmHg 40/98 restart TF's at lower  rate, attempt reg HD  10/12 Transferred to med-surg floor. Remains on PCCM service 10/14 SLP eval: pt unable to tolerate PMV adequately 10/17-will re-attempt PM valve-discussed with Speech therapist 10/18 passed swallow eval-start pureed thick foods no thin liquids-continue NG feeds 10/19 transferred to step down for sepsis/aspiration pneumonia 10/19 cxr shows RLL opacity 10/21 sacral decub debride at bedside by surgery 10/22 started back on vasopressors while on HD 10/27 CT with osteo, R hip fx, unable to identify tip of dubhoff tube - sent for fluoro study 10/28 Ortho consultation: I do not feel that she is a surgical candidate. Therefore, I feel that it would best to manage this fracture nonsurgically and allow it to heal by itself over time, which it should.  10/28 Gen Surg consultation: Due to the lack of free air or free flow of contrast and the peritoneum there is no indication for any surgical intervention on this. Would recommend pulling back the feeding tube 1-2 cm. Would recheck an abdominal film to confirm no pneumoperitoneum in the morning. Absence of any changes okay to continue using Dobbhoff for feeding and medications. 10/28 gastrograffin study: The study confirms that the feeding tube pes perforated through the duodenum and the tip is within a cavity that fills with injected contrast. The cavity does appear walled off 11/4 refuses oral feeds, continue TF's CT reviewed: T8-T9 discitis/osteomyelitis, RT HIP FRACTURE 11/5 placed back on vasopressors, placed back on Vent due to resp acidosis, levophed turned off 11/6 afternoon 11/5 PM Trach changed out due to cuff leak, #6 Shiley cuffed, 11/6 dubhoff occluded, removed and replaced, new dubhoff shows tube in the antral stomach 11/15 refusing to take oral feeds 11/18 placed back on Vent for increased WOB,SOB. 11/28 Wound care as re -consulted, there appears to be a new pocket/fistula around  the area of her stage IV sacral  wound. 10/18/2015; the patient developed a new left basal pneumonia; with recurrent sepsis.  10/19/2015; fevers resolved with IV acetaminophen. Tube feeds changed from vital to Jevity.  12/7 CXR with stable b/l opacities.  12/12 elevated K s/p HD-remains on AC mode 12/16-vomiting-hold feeds and re-assess in next 24 hrs 12/17- TF restarted at 1/2 goal (goal = 50cc/per) 12/18- elevated wbc, fever (101.4), recultured, restarted on ceftaz 12/19 Pericardial friction rub. Echocardiogram: LVEF 30-35%, anteroseptal hypokinesis or akinesis c/w anterior wall MI 12/20 Dr Sung Amabile discussed new echocardiogram findings with pt's husband and explained that this likely represents an anterior wall MI sometime in the previous couple of weeks. It was explained that she is not a candidate for any cardiology intervention. Code status was again discussed. Husband expressed understanding that she was not going to survive this illness in any favorable way but continues to request that she be full code status based on what he believes to be her wishes 12/21 vomitied 50cc of TF - AM TF held, restart TF at 3pm  12/26 Persistent intermittent trach cuff leak. Distal XLT trach requested.  12/27 15 beat NSVT 12/28 patient refused HD and refused Bath time 12/29 D10 infusion for hypoglycemia, TF's on hold due to intolerance 12/30 CODE BLUE VTACH 01/01 transfuse 2 units of blood 01/02 increased respiratory distress, copious purulent secretions 01/02 LE venous US: No DVT 01/03 Repeat TTE: The cavity size was mildly dilated. Systolic function was moderately to severely reduced. The estimated ejection fraction was in the range of 30% to 35%. Diffuse hypokinesis. Moderate to severe hypokinesis of the anteroseptal, anterior, and apical myocardium. Hypokinesis of the anterior myocardium 01/05 Hypothermic - Bair Hugger ordered 01/10-placed back on vasopressors for septic shock 1/11-failed SBT due to resp muscle  fatigue 1/13-worsening shock and hypoxia, agonal respirations 1/17-Husband demanding EEG.  1/18-Trach changed by ENT from size 6 to 8- leak improved.  1/19- Not tolerating HD, levophed up to 25 mc, nephro switching back to CRRT.   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 Pneumoniae, P. Mirabilis, VRE Wound 9/26 >> No growth Blood 9/23 >> NEG CDiff 9/27>>neg Stool Cx 10/15>> negative Cdiff 10/25>>neg Trach Aspirate 10/28>> light growth pseudomonas Blood 10/28 >> 1/2 GPC >>  Sputum cultures obtained 09/22/15 due to mucus plugging>>Pseudomonas BONE TISSUE Cx 11/3>>E.coli (ESBL), k. Pneumonia (daptomycin and septra) Bld Cx 11/27>>negative thus far.  Trach Asp 11/27>> grew out Pseudomonas, and Klebsiella pneumonia. Trach Asp 12/18>> MDR Klebsiella, Pseudomonas Bld Cx 12/18>> NEG 12/26 resp culture >> MDR pseudomonas  ANTIMICROBIALS:  Aztreonam 9/23 >> 9/24 Vanc 9/23 >> 9/25 Vanc 9/26>>9/27 Daptomycin 9/27>> 10/12, 10/28>> off Meropenem 9/23 >> 10/14, 10/28 >> 10/31 Zosyn 11/27,>> 11/30. Levaquin 11/29>> 11/29. Ceftaz 12/1>>12/11, 12/18>> 12/24 Colisitin 12/26 >>  Tigecycline 12/26 >>  Nebulized tobramycin 12/30 >>   Consults: See charting.   Final exam/Code blue.   On the patient's final day in the hospital. Upon evaluation, she was awake in the morning but as similar to previous days, she was not responsive, did not follow commands. She did not make eye Contact with the examiner, she simply lay still with her eyes open. The  patient was examined at that time and was discussed with the critical care RN. At that time  it was noted that the patients levophed has been increased from 30 to 35 mc while the patient was on CRRT. The CRRT was stopped approximately 2 hours earlier, and subsequently the BP was stable on the current dose of levophed.  Approximately 5 minutes later,  I was called due to sudden development of bradycardia to the room emergently. Immediately after that, at 8:31 AM,  the patient went into cardiac arrest, CPR was initiated. She was coded for 30 minutes, during this time, she received 5 mg of epinephrine, 2 g of mag, 1 dose of atropine, 300 mg of amiodarone IV push. She regained a very weak pulse on 2 occasions which were very weakly audible only with Doppler. Her ribs and sternum appeared to be fractured and the heartbeat could be seen and felt upon palpation through the chest wall. However, she did not register adequate blood pressure with these pulses, therefore, CPR was resumed on both occasions that this was observed.On each occasion CPR was stopped for approximately 15 seconds and then resumed.  We also checked a blood glucose which was 150. At the end of approximately 30 minutes, there was still no pulse. The patient did not have any spontaneous respirations.  In consultation with the code team, as well as Dr. Cherylann Ratel who was at the bedside, the code was stopped and the patient was declared expired.  Discharge Exam: Blood pressure 141/49, pulse 44, temperature 97.3 F (36.3 C), temperature source Axillary, resp. rate 0, height  (1.753 m), weight 224 lb 13.9 oz (102 kg), SpO2 94 %.  SUBJ:  The patient is minimally responsive today, 1/19. She opens her eyes to look at me, but makes to attempt to communicate or interact.   VITAL SIGNS: Temp: [93.7 F (34.3 C)-98.8 F (37.1 C)] 98.2 F (36.8 C) (01/19 0700) Pulse Rate: [37-83] 82 (01/19 0700) Resp: [0-21] 14 (01/19 0700) BP: (80-130)/(30-80) 114/41 mmHg (01/19 0700) SpO2: [94 %-100 %] 99 % (01/19 0700) FiO2 (%): [35 %] 35 %  (01/19 0836) HEMODYNAMICS:   VENTILATOR SETTINGS: Vent Mode: [-] PRVC FiO2 (%): [35 %] 35 % Set Rate: [14 bmp] 14 bmp Vt Set: [500 mL] 500 mL PEEP: [5 cmH20] 5 cmH20 Plateau Pressure: [34 cmH20] 34 cmH20 INTAKE / OUTPUT:  Intake/Output Summary (Last 24 hours) at 12/08/15 1117 Last data filed at 12/08/15 0501  Gross per 24 hour  Intake 1026.57 ml  Output  1000 ml  Net 26.57 ml    Review of Systems  Unable to perform ROS: critical illness   Physical Exam  Constitutional:  Critical ill appearance  HENT:  Head: Normocephalic and atraumatic.  Right Ear: External ear normal.  Left Ear: External ear normal.  Eyes: Conjunctivae are normal.  Neck: Neck supple. No tracheal deviation present. No thyromegaly present.  Cardiovascular: Regular rhythm, normal heart sounds and intact distal pulses.  No murmur heard. Pulmonary/Chest: She has no wheezes.  Tracheostomy - on MV  Abdominal: Soft. Bowel sounds are normal. She exhibits no distension.  Musculoskeletal: She exhibits edema.  Ext cool, trace symmetric edema, marked muscle wasting Severe sacral decubitus ulcer- to the spine  Skin: Skin is warm.  Nursing note and vitals reviewed.       Disposition: Expired.       Signed: Shane Crutch 2015-12-13, 9:10 AM

## 2015-12-21 NOTE — Progress Notes (Signed)
Notified husband that they are currently doing CPR on patient at this time. Let him know that physician would call him when possible.

## 2015-12-21 NOTE — Progress Notes (Signed)
Patient opens eyes but has not followed any simple commands. CRRT in progress this am, patient became hypothermic. Warming blanket placed. Levophed titrated to maintain map of 65. Tolerating tube feeds at this point. Has had loose stools throughout shift. When repositioned to be cleaned CRRT became occluded venous pressure. No blood return from ports. Per order activase used. Labs drawn this am. Spoke with Mr. Maryelizabeth Rowan start of shift. Informed of patient not following any commands but does open eyes, titrating levophed and wanted to know what patients platelet count was.

## 2015-12-21 DEATH — deceased

## 2016-02-24 IMAGING — CR DG CHEST 1V PORT
1 series · 1 of 1 positions shown · non-contrast
Comparison: September 12, 2015

CLINICAL DATA: Shortness of breath.  Chronic renal failure

EXAM:
PORTABLE CHEST 1 VIEW

[ap]
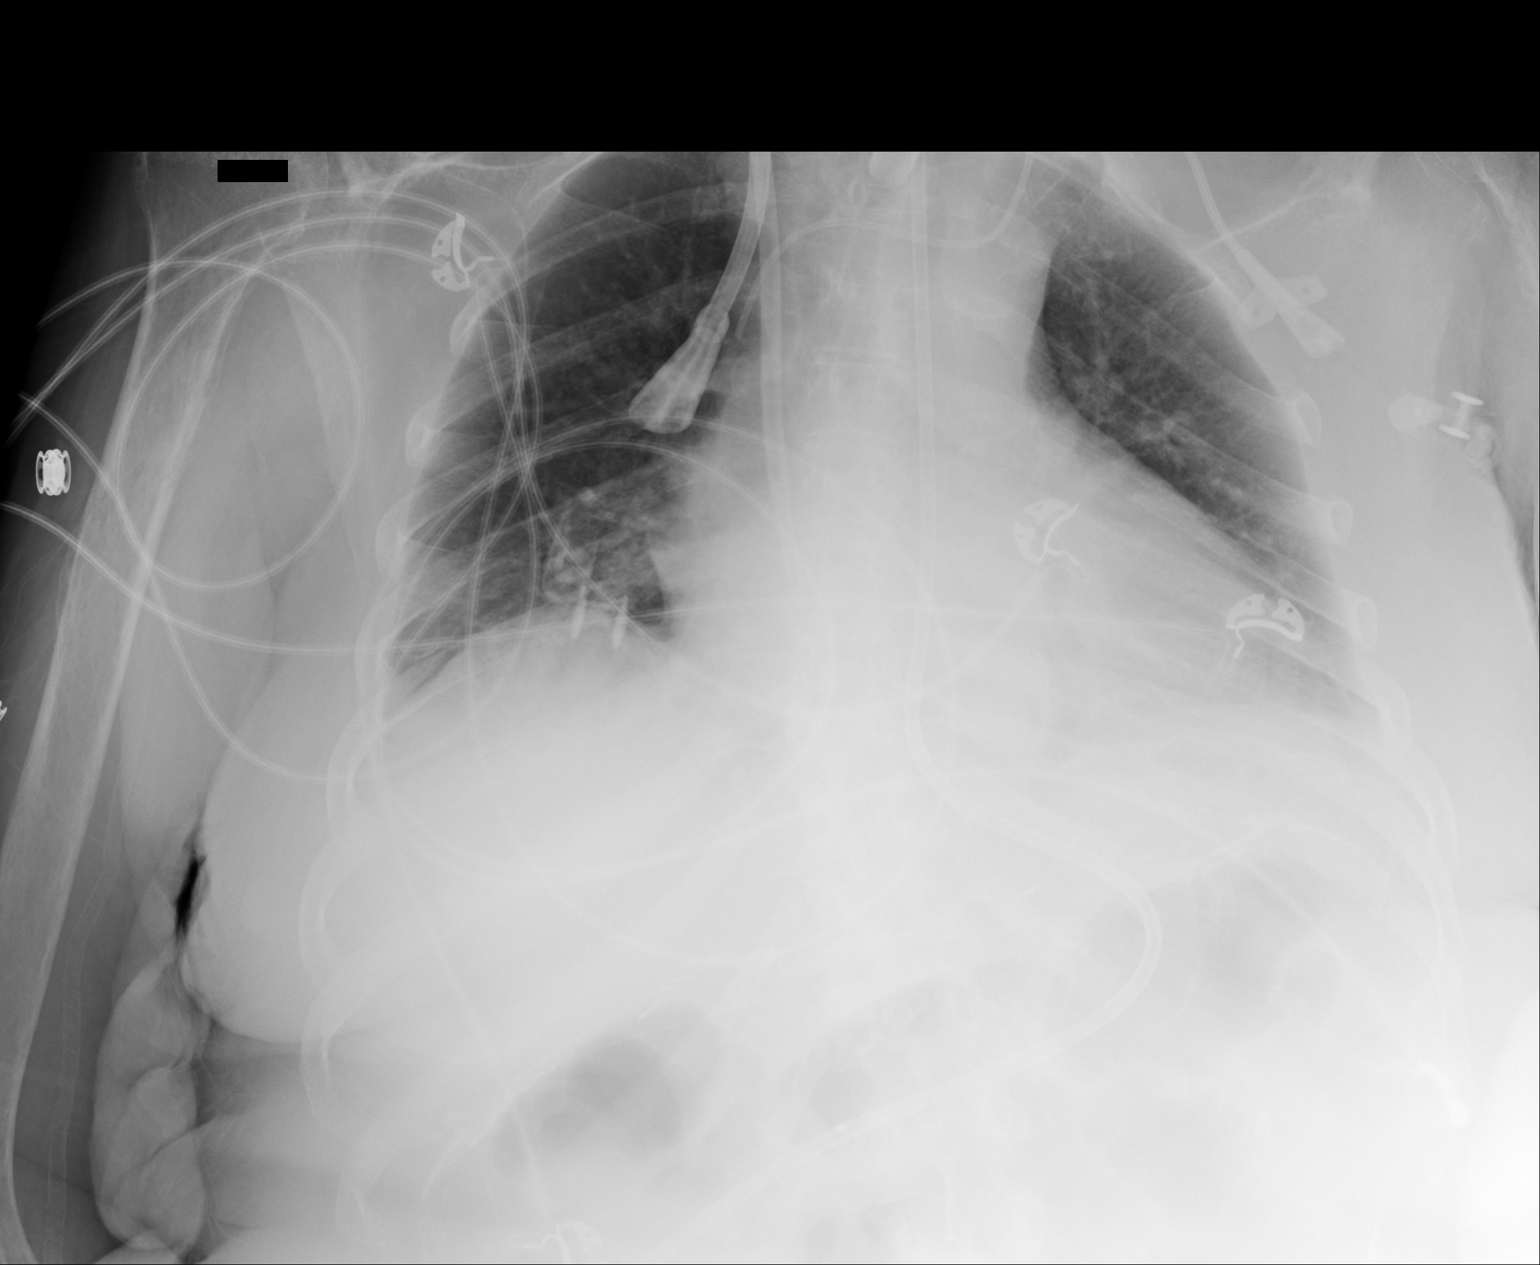

[1 of 1 positions shown; findings below may reference images not displayed]

FINDINGS: Tracheostomy catheter tip is 5.2 cm above the carina. There is a
right-sided central catheter with the tip just inferior to the
cavoatrial junction in the right atrium. A left-sided central
catheter has its tip in the superior vena cava. Feeding tube tip is
below the diaphragm, likely in the duodenum. No pneumothorax. There
is no edema or consolidation. There is a minimal left pleural
effusion. Heart is mildly enlarged with pulmonary vascularity within
normal limits. No adenopathy.
IMPRESSION: Tube and catheter positions are stable without pneumothorax. Stable
cardiac prominence. Minimal left effusion. Lungs elsewhere clear.

## 2016-02-24 IMAGING — CR DG CHEST 1V PORT
1 series · 1 of 1 positions shown · non-contrast
Comparison: 09/07/2015

CLINICAL DATA: Respiratory failure

EXAM:
PORTABLE CHEST 1 VIEW

[portable]
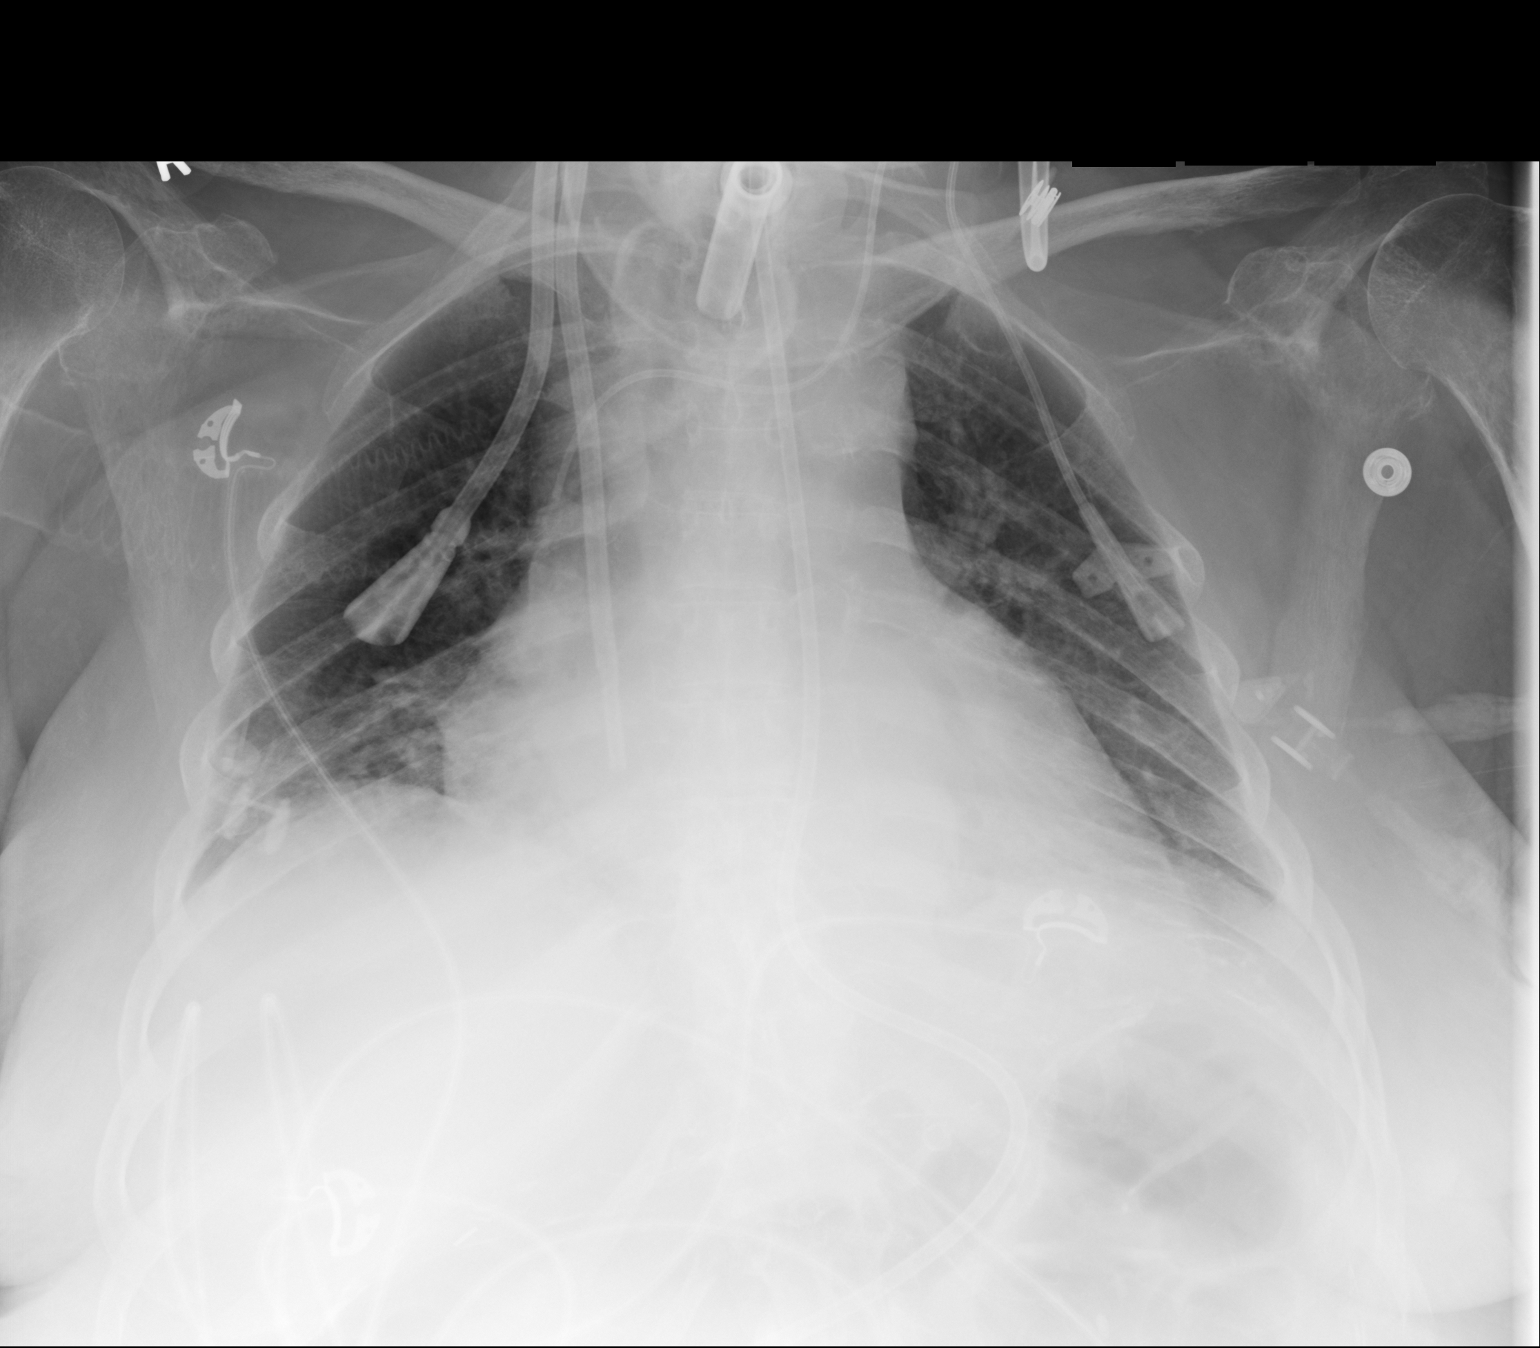

[1 of 1 positions shown; findings below may reference images not displayed]

FINDINGS: The ET tube tip is above the carina. Left IJ catheter tip is in the
SVC. There is a right sided dialysis catheter with tips in the right
atrium. A feeding tube is noted with tip below the GE junction.
Stable cardiac enlargement and small bilateral pleural effusions.
IMPRESSION: 1. Stable support apparatus.
2. Cardiac enlargement and small bilateral pleural effusions.

## 2016-02-29 IMAGING — CR DG ABDOMEN 1V
1 series · 2 of 2 positions shown · non-contrast
Comparison: Abdominal radiograph dated 09/08/2015. CT abdomen
pelvis dated 09/08/2015.

CLINICAL DATA: Gastric perforation, evaluate for free air

EXAM:
ABDOMEN - 1 VIEW

[Series 1: ap · 0.17mm/px · 2 of 2 slices shown]
[im 1/2]
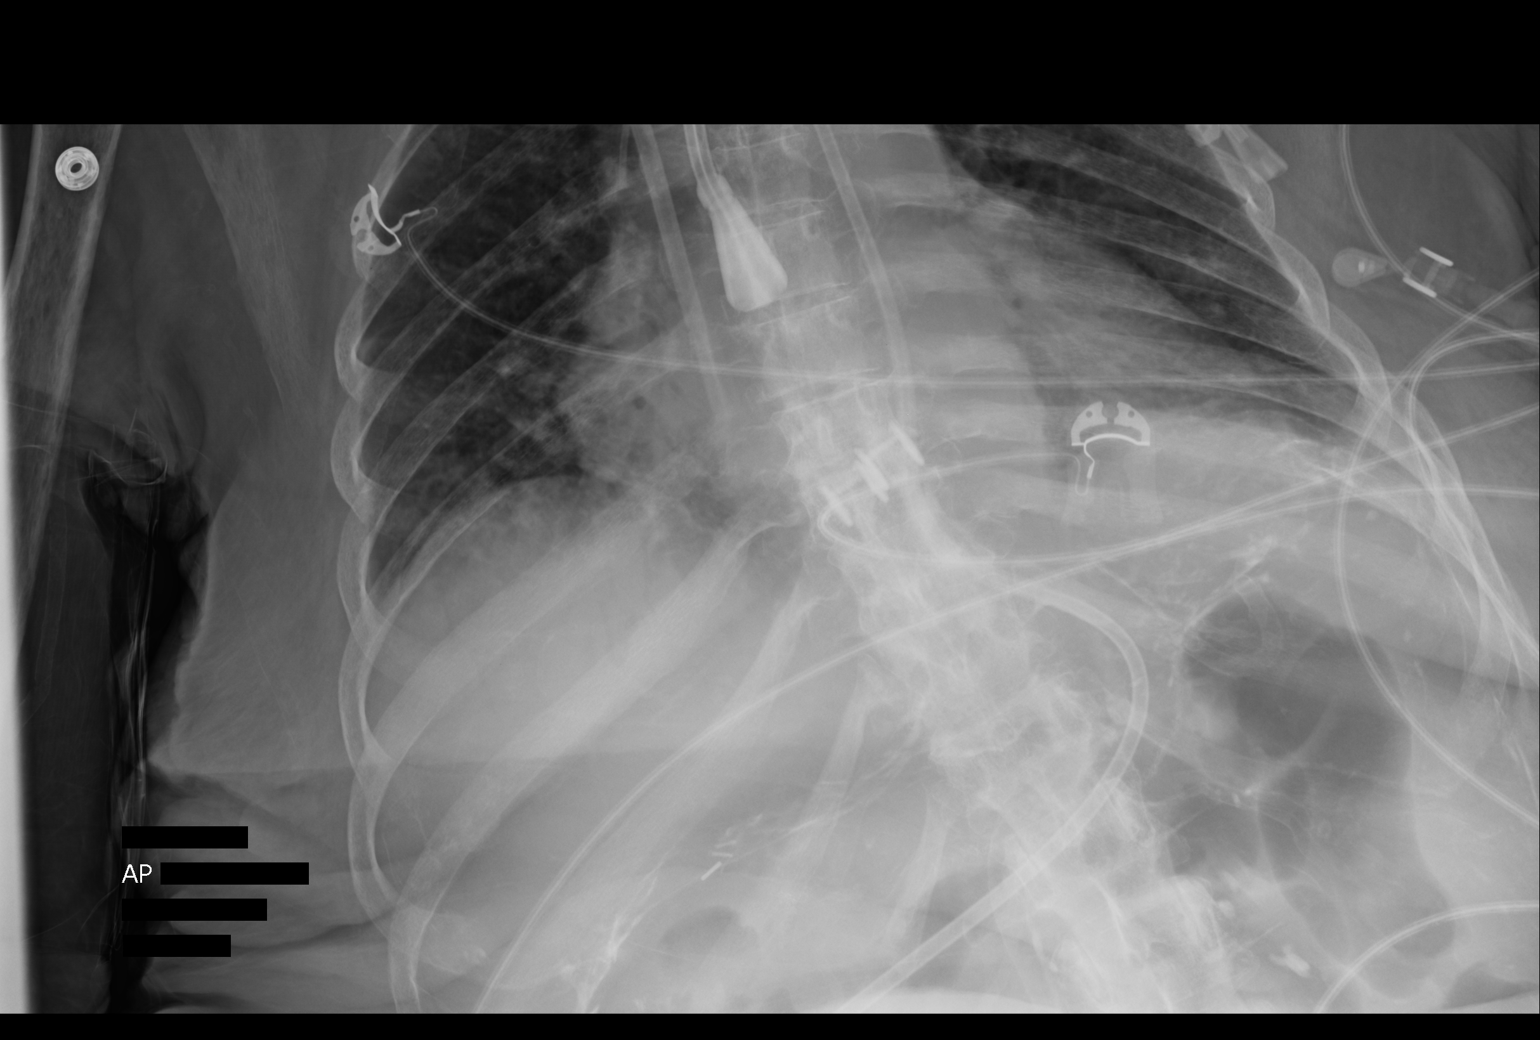
[im 2/2]
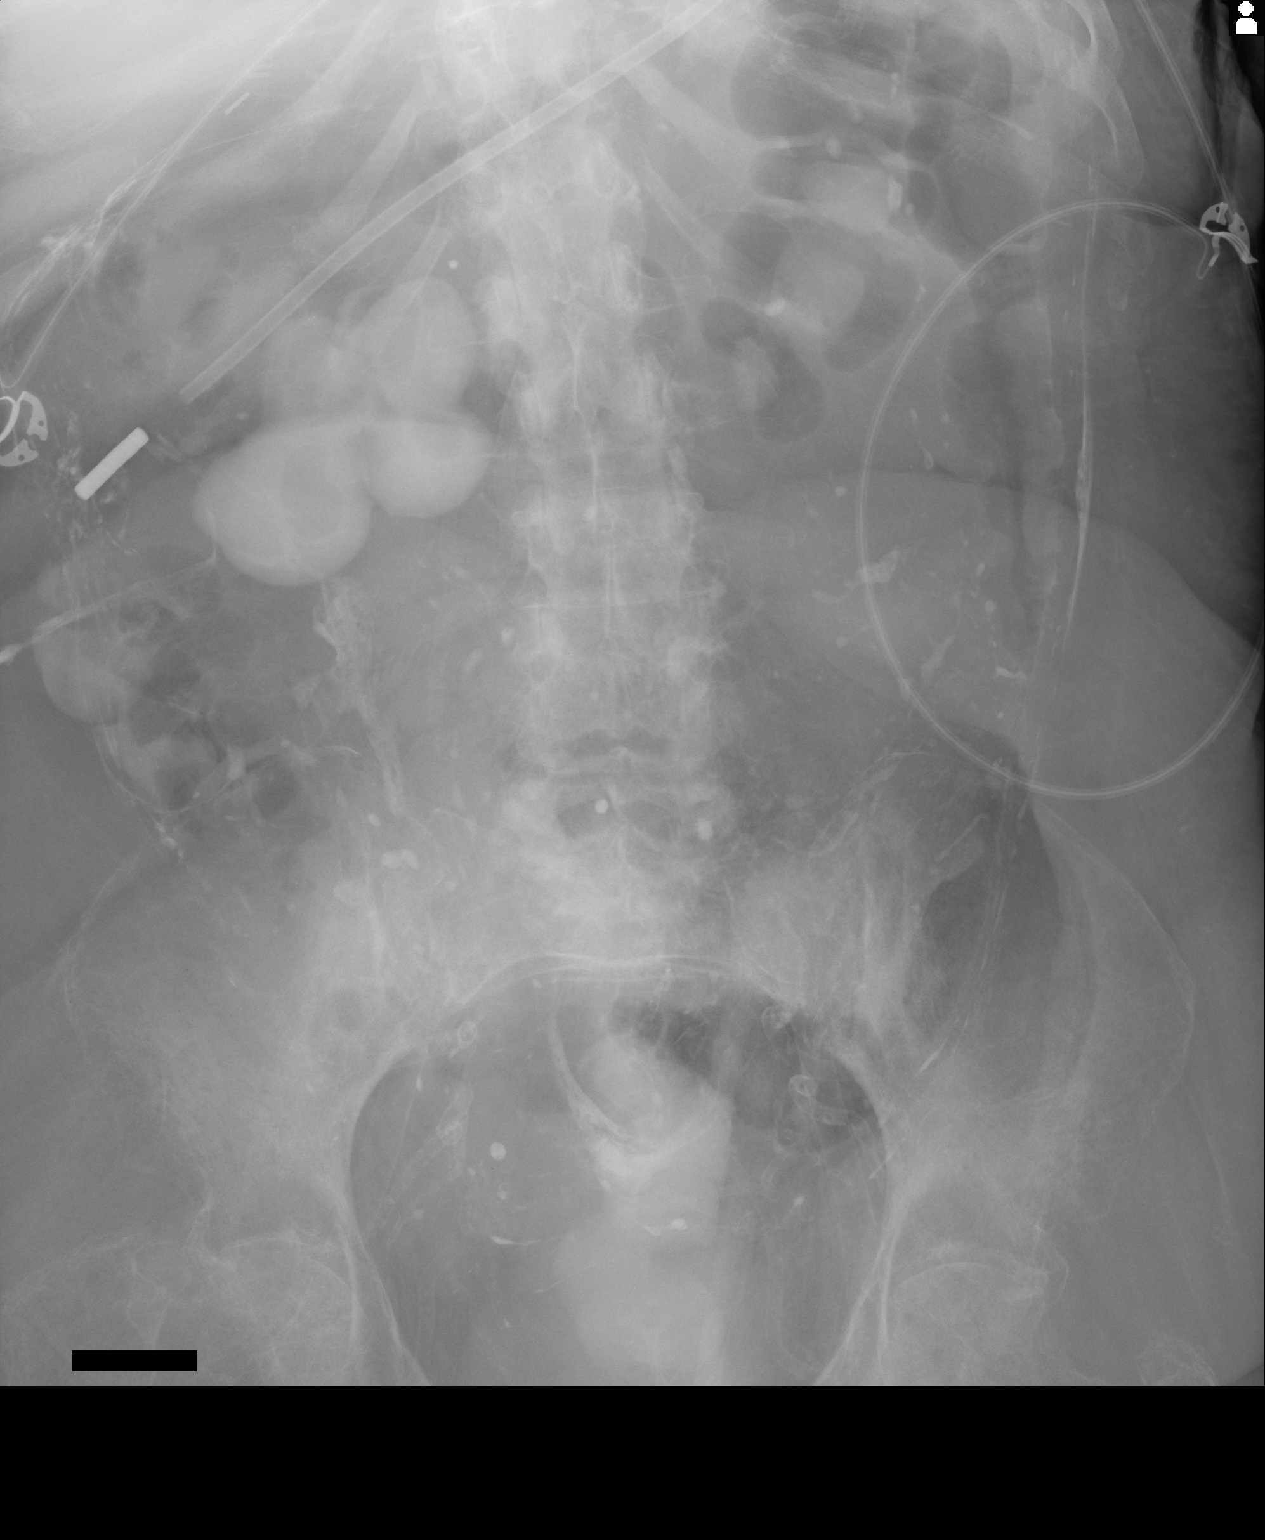

[2 of 2 positions shown; findings below may reference images not displayed]

FINDINGS: Nonobstructive bowel gas pattern.

Weighted feeding tube terminates over the right lateral abdomen, in
an extraluminal location when correlating with recent prior studies.

No evidence of free air under the diaphragm on the upright view.
Notably, there is no free air on recent CT, only a small amount of
localized fluid/gas within the anterior abdominal wall (the likely
site where the weighted feeding tube terminates).
IMPRESSION: No free air is seen.
